# Patient Record
Sex: Female | Born: 1940 | Race: Black or African American | Hispanic: No | Marital: Married | Smoking: Former smoker
Health system: Southern US, Community
[De-identification: ages and names within clinical notes are randomized; demographics above are authoritative.]

## PROBLEM LIST (undated history)

## (undated) DIAGNOSIS — G4730 Sleep apnea, unspecified: Secondary | ICD-10-CM

## (undated) DIAGNOSIS — E785 Hyperlipidemia, unspecified: Secondary | ICD-10-CM

## (undated) DIAGNOSIS — G629 Polyneuropathy, unspecified: Secondary | ICD-10-CM

## (undated) DIAGNOSIS — N189 Chronic kidney disease, unspecified: Secondary | ICD-10-CM

## (undated) DIAGNOSIS — E119 Type 2 diabetes mellitus without complications: Secondary | ICD-10-CM

## (undated) DIAGNOSIS — N139 Obstructive and reflux uropathy, unspecified: Secondary | ICD-10-CM

## (undated) DIAGNOSIS — Z9981 Dependence on supplemental oxygen: Secondary | ICD-10-CM

## (undated) DIAGNOSIS — M545 Low back pain, unspecified: Secondary | ICD-10-CM

## (undated) DIAGNOSIS — I10 Essential (primary) hypertension: Secondary | ICD-10-CM

## (undated) DIAGNOSIS — I2510 Atherosclerotic heart disease of native coronary artery without angina pectoris: Secondary | ICD-10-CM

## (undated) DIAGNOSIS — Z923 Personal history of irradiation: Secondary | ICD-10-CM

## (undated) DIAGNOSIS — J449 Chronic obstructive pulmonary disease, unspecified: Secondary | ICD-10-CM

## (undated) DIAGNOSIS — Z992 Dependence on renal dialysis: Secondary | ICD-10-CM

## (undated) DIAGNOSIS — C801 Malignant (primary) neoplasm, unspecified: Secondary | ICD-10-CM

## (undated) DIAGNOSIS — J45909 Unspecified asthma, uncomplicated: Secondary | ICD-10-CM

## (undated) DIAGNOSIS — K219 Gastro-esophageal reflux disease without esophagitis: Secondary | ICD-10-CM

## (undated) DIAGNOSIS — D631 Anemia in chronic kidney disease: Secondary | ICD-10-CM

## (undated) DIAGNOSIS — M1990 Unspecified osteoarthritis, unspecified site: Secondary | ICD-10-CM

## (undated) DIAGNOSIS — Z9221 Personal history of antineoplastic chemotherapy: Secondary | ICD-10-CM

## (undated) DIAGNOSIS — R0600 Dyspnea, unspecified: Secondary | ICD-10-CM

## (undated) DIAGNOSIS — R319 Hematuria, unspecified: Secondary | ICD-10-CM

## (undated) DIAGNOSIS — K900 Celiac disease: Secondary | ICD-10-CM

## (undated) HISTORY — PX: ROTATOR CUFF REPAIR: SHX139

## (undated) HISTORY — PX: PORTACATH PLACEMENT: SHX2246

## (undated) HISTORY — PX: ABDOMINAL HYSTERECTOMY: SHX81

## (undated) SURGERY — Surgical Case
Anesthesia: *Unknown

---

## 2004-04-10 ENCOUNTER — Other Ambulatory Visit: Payer: Self-pay

## 2006-12-04 ENCOUNTER — Emergency Department: Payer: Self-pay | Admitting: Emergency Medicine

## 2006-12-06 ENCOUNTER — Emergency Department: Payer: Self-pay | Admitting: Emergency Medicine

## 2007-11-09 ENCOUNTER — Other Ambulatory Visit: Payer: Self-pay

## 2007-11-10 ENCOUNTER — Observation Stay: Payer: Self-pay | Admitting: Internal Medicine

## 2008-01-29 ENCOUNTER — Emergency Department: Payer: Self-pay

## 2008-04-02 ENCOUNTER — Ambulatory Visit: Payer: Self-pay

## 2008-04-19 ENCOUNTER — Ambulatory Visit: Payer: Self-pay

## 2008-05-11 ENCOUNTER — Emergency Department: Payer: Self-pay | Admitting: Emergency Medicine

## 2008-11-23 ENCOUNTER — Ambulatory Visit: Payer: Self-pay | Admitting: Internal Medicine

## 2009-10-08 ENCOUNTER — Ambulatory Visit: Payer: Self-pay | Admitting: Internal Medicine

## 2010-07-01 ENCOUNTER — Emergency Department: Payer: Self-pay | Admitting: Emergency Medicine

## 2010-10-14 ENCOUNTER — Ambulatory Visit: Payer: Self-pay | Admitting: Anesthesiology

## 2010-11-25 ENCOUNTER — Ambulatory Visit: Payer: Self-pay | Admitting: Anesthesiology

## 2011-09-18 LAB — COMPREHENSIVE METABOLIC PANEL
Anion Gap: 13 (ref 7–16)
BUN: 35 mg/dL — ABNORMAL HIGH (ref 7–18)
Bilirubin,Total: 0.2 mg/dL (ref 0.2–1.0)
Chloride: 108 mmol/L — ABNORMAL HIGH (ref 98–107)
Creatinine: 2.25 mg/dL — ABNORMAL HIGH (ref 0.60–1.30)
EGFR (African American): 28 — ABNORMAL LOW
Osmolality: 296 (ref 275–301)
Potassium: 4 mmol/L (ref 3.5–5.1)
SGPT (ALT): 23 U/L
Sodium: 143 mmol/L (ref 136–145)
Total Protein: 8.9 g/dL — ABNORMAL HIGH (ref 6.4–8.2)

## 2011-09-18 LAB — CBC
HCT: 34.3 % — ABNORMAL LOW (ref 35.0–47.0)
HGB: 11.4 g/dL — ABNORMAL LOW (ref 12.0–16.0)
MCH: 29 pg (ref 26.0–34.0)
RBC: 3.95 10*6/uL (ref 3.80–5.20)
RDW: 13.8 % (ref 11.5–14.5)
WBC: 6.2 10*3/uL (ref 3.6–11.0)

## 2011-09-18 LAB — LIPASE, BLOOD: Lipase: 288 U/L (ref 73–393)

## 2011-09-19 ENCOUNTER — Inpatient Hospital Stay: Payer: Self-pay | Admitting: Internal Medicine

## 2011-09-19 LAB — URINALYSIS, COMPLETE
Bilirubin,UR: NEGATIVE
Blood: NEGATIVE
Ketone: NEGATIVE
Nitrite: NEGATIVE
Ph: 5 (ref 4.5–8.0)
Protein: NEGATIVE
RBC,UR: 1 /HPF (ref 0–5)
WBC UR: 4 /HPF (ref 0–5)

## 2011-09-19 LAB — CK TOTAL AND CKMB (NOT AT ARMC)
CK, Total: 254 U/L — ABNORMAL HIGH (ref 21–215)
CK-MB: 1.3 ng/mL (ref 0.5–3.6)

## 2011-09-19 LAB — TROPONIN I: Troponin-I: <0.02 ng/mL

## 2011-09-20 LAB — BASIC METABOLIC PANEL
Anion Gap: 16 (ref 7–16)
BUN: 30 mg/dL — ABNORMAL HIGH (ref 7–18)
Calcium, Total: 9.3 mg/dL (ref 8.5–10.1)
Co2: 23 mmol/L (ref 21–32)
Creatinine: 2.01 mg/dL — ABNORMAL HIGH (ref 0.60–1.30)
EGFR (African American): 31 — ABNORMAL LOW
EGFR (Non-African Amer.): 26 — ABNORMAL LOW
Glucose: 130 mg/dL — ABNORMAL HIGH (ref 65–99)
Osmolality: 298 (ref 275–301)
Sodium: 146 mmol/L — ABNORMAL HIGH (ref 136–145)

## 2011-09-20 LAB — CBC WITH DIFFERENTIAL/PLATELET
Basophil #: 0 10*3/uL (ref 0.0–0.1)
Eosinophil #: 0.1 10*3/uL (ref 0.0–0.7)
HGB: 10.6 g/dL — ABNORMAL LOW (ref 12.0–16.0)
MCH: 29.1 pg (ref 26.0–34.0)
MCHC: 33.6 g/dL (ref 32.0–36.0)
MCV: 87 fL (ref 80–100)
Monocyte #: 0.5 10*3/uL (ref 0.0–0.7)
Neutrophil %: 50.9 %
Platelet: 199 10*3/uL (ref 150–440)
RBC: 3.64 10*6/uL — ABNORMAL LOW (ref 3.80–5.20)
RDW: 13.3 % (ref 11.5–14.5)

## 2011-09-20 LAB — MAGNESIUM: Magnesium: 1.9 mg/dL

## 2011-09-20 LAB — TSH: Thyroid Stimulating Horm: 1.08 u[IU]/mL

## 2011-09-20 LAB — LIPID PANEL
Cholesterol: 113 mg/dL (ref 0–200)
HDL Cholesterol: 20 mg/dL — ABNORMAL LOW (ref 40–60)
Triglycerides: 546 mg/dL — ABNORMAL HIGH (ref 0–200)

## 2011-09-21 LAB — BASIC METABOLIC PANEL
BUN: 21 mg/dL — ABNORMAL HIGH (ref 7–18)
Calcium, Total: 9.2 mg/dL (ref 8.5–10.1)
Chloride: 106 mmol/L (ref 98–107)
Co2: 23 mmol/L (ref 21–32)
EGFR (Non-African Amer.): 29 — ABNORMAL LOW
Glucose: 156 mg/dL — ABNORMAL HIGH (ref 65–99)
Osmolality: 293 (ref 275–301)
Potassium: 4 mmol/L (ref 3.5–5.1)
Sodium: 144 mmol/L (ref 136–145)

## 2011-09-21 LAB — URINE CULTURE

## 2011-10-20 ENCOUNTER — Ambulatory Visit: Payer: Self-pay | Admitting: Family Medicine

## 2012-05-09 ENCOUNTER — Ambulatory Visit: Payer: Self-pay | Admitting: Unknown Physician Specialty

## 2012-08-02 ENCOUNTER — Inpatient Hospital Stay: Payer: Self-pay | Admitting: Internal Medicine

## 2012-08-02 LAB — COMPREHENSIVE METABOLIC PANEL
BUN: 31 mg/dL — ABNORMAL HIGH (ref 7–18)
Calcium, Total: 9.2 mg/dL (ref 8.5–10.1)
Chloride: 111 mmol/L — ABNORMAL HIGH (ref 98–107)
Co2: 25 mmol/L (ref 21–32)
EGFR (African American): 28 — ABNORMAL LOW
EGFR (Non-African Amer.): 24 — ABNORMAL LOW
Glucose: 118 mg/dL — ABNORMAL HIGH (ref 65–99)
Osmolality: 291 (ref 275–301)
Potassium: 4 mmol/L (ref 3.5–5.1)
SGOT(AST): 21 U/L (ref 15–37)
SGPT (ALT): 19 U/L (ref 12–78)
Sodium: 142 mmol/L (ref 136–145)
Total Protein: 8.9 g/dL — ABNORMAL HIGH (ref 6.4–8.2)

## 2012-08-02 LAB — URINALYSIS, COMPLETE
Glucose,UR: NEGATIVE mg/dL (ref 0–75)
Ketone: NEGATIVE
Nitrite: NEGATIVE
Ph: 5 (ref 4.5–8.0)
Protein: NEGATIVE
RBC,UR: 4 /HPF (ref 0–5)
Specific Gravity: 1.012 (ref 1.003–1.030)
WBC UR: <1 /HPF (ref 0–5)

## 2012-08-02 LAB — CBC
HCT: 30.4 % — ABNORMAL LOW (ref 35.0–47.0)
HGB: 9.9 g/dL — ABNORMAL LOW (ref 12.0–16.0)
MCH: 28.3 pg (ref 26.0–34.0)
MCHC: 32.7 g/dL (ref 32.0–36.0)
Platelet: 216 10*3/uL (ref 150–440)
RBC: 3.51 10*6/uL — ABNORMAL LOW (ref 3.80–5.20)
WBC: 7.7 10*3/uL (ref 3.6–11.0)

## 2012-08-02 LAB — LIPASE, BLOOD: Lipase: 500 U/L — ABNORMAL HIGH (ref 73–393)

## 2012-08-02 LAB — TROPONIN I: Troponin-I: <0.02 ng/mL

## 2012-08-02 LAB — CK TOTAL AND CKMB (NOT AT ARMC): CK, Total: 291 U/L — ABNORMAL HIGH (ref 21–215)

## 2012-08-03 LAB — BASIC METABOLIC PANEL
Calcium, Total: 9 mg/dL (ref 8.5–10.1)
Co2: 23 mmol/L (ref 21–32)
Creatinine: 1.95 mg/dL — ABNORMAL HIGH (ref 0.60–1.30)
EGFR (African American): 29 — ABNORMAL LOW
Glucose: 113 mg/dL — ABNORMAL HIGH (ref 65–99)
Osmolality: 283 (ref 275–301)
Potassium: 3.8 mmol/L (ref 3.5–5.1)
Sodium: 139 mmol/L (ref 136–145)

## 2012-08-03 LAB — CBC WITH DIFFERENTIAL/PLATELET
Basophil %: 0.9 %
Eosinophil #: 0.1 10*3/uL (ref 0.0–0.7)
HCT: 30 % — ABNORMAL LOW (ref 35.0–47.0)
Lymphocyte %: 28.4 %
MCH: 29.2 pg (ref 26.0–34.0)
Monocyte #: 0.7 x10 3/mm (ref 0.2–0.9)
Monocyte %: 7.2 %
Neutrophil #: 5.8 10*3/uL (ref 1.4–6.5)
Platelet: 210 10*3/uL (ref 150–440)
RBC: 3.47 10*6/uL — ABNORMAL LOW (ref 3.80–5.20)

## 2012-08-03 LAB — LIPID PANEL
Cholesterol: 100 mg/dL (ref 0–200)
HDL Cholesterol: 31 mg/dL — ABNORMAL LOW (ref 40–60)
Ldl Cholesterol, Calc: 22 mg/dL (ref 0–100)
Triglycerides: 235 mg/dL — ABNORMAL HIGH (ref 0–200)
VLDL Cholesterol, Calc: 47 mg/dL — ABNORMAL HIGH (ref 5–40)

## 2012-08-03 LAB — TROPONIN I: Troponin-I: <0.02 ng/mL

## 2012-08-03 LAB — CK TOTAL AND CKMB (NOT AT ARMC): CK-MB: <0.5 ng/mL — ABNORMAL LOW (ref 0.5–3.6)

## 2012-08-04 LAB — CBC WITH DIFFERENTIAL/PLATELET
Basophil %: 0.5 %
Eosinophil #: 0.2 10*3/uL (ref 0.0–0.7)
Eosinophil %: 1.8 %
HCT: 29.6 % — ABNORMAL LOW (ref 35.0–47.0)
HGB: 10.1 g/dL — ABNORMAL LOW (ref 12.0–16.0)
Lymphocyte #: 2.7 10*3/uL (ref 1.0–3.6)
Lymphocyte %: 29.9 %
MCH: 29.4 pg (ref 26.0–34.0)
MCHC: 34.2 g/dL (ref 32.0–36.0)
MCV: 86 fL (ref 80–100)
Neutrophil #: 5.4 10*3/uL (ref 1.4–6.5)
Platelet: 225 10*3/uL (ref 150–440)
WBC: 9.1 10*3/uL (ref 3.6–11.0)

## 2012-08-04 LAB — BASIC METABOLIC PANEL
BUN: 25 mg/dL — ABNORMAL HIGH (ref 7–18)
Calcium, Total: 9 mg/dL (ref 8.5–10.1)
Co2: 26 mmol/L (ref 21–32)
Creatinine: 1.97 mg/dL — ABNORMAL HIGH (ref 0.60–1.30)
EGFR (African American): 29 — ABNORMAL LOW
Glucose: 133 mg/dL — ABNORMAL HIGH (ref 65–99)
Potassium: 3.7 mmol/L (ref 3.5–5.1)

## 2012-08-04 LAB — URIC ACID: Uric Acid: 8.7 mg/dL — ABNORMAL HIGH (ref 2.6–6.0)

## 2012-08-05 LAB — CBC WITH DIFFERENTIAL/PLATELET
Basophil #: 0.1 x10 3/mm 3
Basophil %: 0.6 %
Eosinophil #: 0 x10 3/mm 3
Eosinophil %: 0 %
HCT: 30.2 % — ABNORMAL LOW
HGB: 9.9 g/dL — ABNORMAL LOW
Lymphocyte %: 14.4 %
Lymphs Abs: 1.2 x10 3/mm 3
MCH: 28 pg
MCHC: 32.9 g/dL
MCV: 85 fL
Monocyte #: 0.3 "x10 3/mm "
Monocyte %: 3.1 %
Neutrophil #: 7 x10 3/mm 3 — ABNORMAL HIGH
Neutrophil %: 81.9 %
Platelet: 230 x10 3/mm 3
RBC: 3.54 X10 6/mm 3 — ABNORMAL LOW
RDW: 13.6 %
WBC: 8.6 x10 3/mm 3

## 2012-08-05 LAB — BASIC METABOLIC PANEL
Chloride: 102 mmol/L (ref 98–107)
Creatinine: 2.03 mg/dL — ABNORMAL HIGH (ref 0.60–1.30)
EGFR (African American): 28 — ABNORMAL LOW
EGFR (Non-African Amer.): 24 — ABNORMAL LOW
Glucose: 162 mg/dL — ABNORMAL HIGH (ref 65–99)
Potassium: 4.3 mmol/L (ref 3.5–5.1)
Sodium: 137 mmol/L (ref 136–145)

## 2012-08-06 LAB — CBC WITH DIFFERENTIAL/PLATELET
HCT: 28.9 % — ABNORMAL LOW (ref 35.0–47.0)
HGB: 9.8 g/dL — ABNORMAL LOW (ref 12.0–16.0)
MCH: 29 pg (ref 26.0–34.0)
MCHC: 33.9 g/dL (ref 32.0–36.0)
MCV: 86 fL (ref 80–100)
Monocyte #: 0.4 x10 3/mm (ref 0.2–0.9)
Platelet: 249 10*3/uL (ref 150–440)
RBC: 3.37 10*6/uL — ABNORMAL LOW (ref 3.80–5.20)
WBC: 10.2 10*3/uL (ref 3.6–11.0)

## 2012-08-06 LAB — BASIC METABOLIC PANEL
Anion Gap: 11 (ref 7–16)
BUN: 54 mg/dL — ABNORMAL HIGH (ref 7–18)
Calcium, Total: 8.8 mg/dL (ref 8.5–10.1)
Chloride: 100 mmol/L (ref 98–107)
Co2: 23 mmol/L (ref 21–32)
Creatinine: 2.59 mg/dL — ABNORMAL HIGH (ref 0.60–1.30)
EGFR (African American): 21 — ABNORMAL LOW
Glucose: 186 mg/dL — ABNORMAL HIGH (ref 65–99)
Osmolality: 288 (ref 275–301)

## 2012-08-07 LAB — BASIC METABOLIC PANEL
Anion Gap: 9 (ref 7–16)
BUN: 63 mg/dL — ABNORMAL HIGH (ref 7–18)
Calcium, Total: 9.1 mg/dL (ref 8.5–10.1)
Chloride: 104 mmol/L (ref 98–107)
Co2: 25 mmol/L (ref 21–32)
Creatinine: 2.48 mg/dL — ABNORMAL HIGH (ref 0.60–1.30)
EGFR (African American): 22 — ABNORMAL LOW
EGFR (Non-African Amer.): 19 — ABNORMAL LOW
Glucose: 172 mg/dL — ABNORMAL HIGH (ref 65–99)
Osmolality: 298 (ref 275–301)
Potassium: 4.4 mmol/L (ref 3.5–5.1)
Sodium: 138 mmol/L (ref 136–145)

## 2012-08-07 LAB — CBC WITH DIFFERENTIAL/PLATELET
Eosinophil %: 0 %
HCT: 30.7 % — ABNORMAL LOW (ref 35.0–47.0)
HGB: 10.2 g/dL — ABNORMAL LOW (ref 12.0–16.0)
Lymphocyte #: 1.6 10*3/uL (ref 1.0–3.6)
MCH: 28.4 pg (ref 26.0–34.0)
MCHC: 33.3 g/dL (ref 32.0–36.0)
Monocyte %: 3.9 %
Neutrophil #: 8.1 10*3/uL — ABNORMAL HIGH (ref 1.4–6.5)
Neutrophil %: 80.3 %
Platelet: 294 10*3/uL (ref 150–440)

## 2012-08-25 ENCOUNTER — Ambulatory Visit: Payer: Self-pay | Admitting: Internal Medicine

## 2012-12-20 ENCOUNTER — Ambulatory Visit: Payer: Self-pay | Admitting: Internal Medicine

## 2013-06-06 ENCOUNTER — Ambulatory Visit: Payer: Self-pay | Admitting: Internal Medicine

## 2013-06-19 ENCOUNTER — Ambulatory Visit: Payer: Self-pay | Admitting: Internal Medicine

## 2013-07-06 ENCOUNTER — Ambulatory Visit: Payer: Self-pay | Admitting: Podiatrist

## 2013-07-20 ENCOUNTER — Ambulatory Visit: Payer: Self-pay | Admitting: Internal Medicine

## 2013-11-22 ENCOUNTER — Emergency Department: Payer: Self-pay | Admitting: Emergency Medicine

## 2013-11-22 LAB — SYNOVIAL CELL COUNT + DIFF, W/ CRYSTALS
Basophil: 0 %
Eosinophil: 0 %
LYMPHS PCT: 10 %
NEUTROS PCT: 81 %
NUCLEATED CELL COUNT: 274 /mm3
Other Cells BF: 0 %
Other Mononuclear Cells: 9 %

## 2013-11-26 LAB — BODY FLUID CULTURE

## 2013-12-08 DIAGNOSIS — I10 Essential (primary) hypertension: Secondary | ICD-10-CM | POA: Insufficient documentation

## 2013-12-08 DIAGNOSIS — I2510 Atherosclerotic heart disease of native coronary artery without angina pectoris: Secondary | ICD-10-CM | POA: Insufficient documentation

## 2013-12-08 DIAGNOSIS — E119 Type 2 diabetes mellitus without complications: Secondary | ICD-10-CM | POA: Insufficient documentation

## 2014-01-03 ENCOUNTER — Ambulatory Visit: Payer: Self-pay | Admitting: Internal Medicine

## 2014-09-07 ENCOUNTER — Ambulatory Visit: Payer: Self-pay | Admitting: Nurse Practitioner

## 2014-10-03 ENCOUNTER — Ambulatory Visit: Payer: Self-pay | Admitting: Nephrology

## 2014-10-05 DIAGNOSIS — M19019 Primary osteoarthritis, unspecified shoulder: Secondary | ICD-10-CM | POA: Insufficient documentation

## 2014-10-05 DIAGNOSIS — M75122 Complete rotator cuff tear or rupture of left shoulder, not specified as traumatic: Secondary | ICD-10-CM | POA: Insufficient documentation

## 2014-10-30 ENCOUNTER — Ambulatory Visit
Admit: 2014-10-30 | Disposition: A | Discharge status: Home / Self Care | Payer: Self-pay | Attending: Surgery | Admitting: Surgery

## 2014-10-30 LAB — POTASSIUM: Potassium: 3.2 mmol/L — ABNORMAL LOW

## 2014-11-02 DIAGNOSIS — G4733 Obstructive sleep apnea (adult) (pediatric): Secondary | ICD-10-CM | POA: Insufficient documentation

## 2014-11-02 DIAGNOSIS — I700 Atherosclerosis of aorta: Secondary | ICD-10-CM | POA: Insufficient documentation

## 2014-11-02 DIAGNOSIS — N184 Chronic kidney disease, stage 4 (severe): Secondary | ICD-10-CM | POA: Insufficient documentation

## 2014-11-06 ENCOUNTER — Ambulatory Visit
Admit: 2014-11-06 | Disposition: A | Discharge status: Home / Self Care | Payer: Self-pay | Attending: Surgery | Admitting: Surgery

## 2014-11-09 NOTE — Discharge Summary (Signed)
DATE OF BIRTH:  1940-11-13  DISCHARGE DIAGNOSES:  1.  Acute respiratory failure secondary to Pneumonia and chronic obstructive pulmonary disease.  2  Pancreatitis- Resolved 3.  Sleep apnea.  4.  Obesity.  5.  Chronic kidney disease.  6.  Hypertension.   CHIEF COMPLAINT:  Left upper quadrant abdominal pain.   HISTORY OF PRESENT ILLNESS:  The patient is a 74 year old female, who had been complaining of left upper quadrant pain for about 2 days. The patient was noted to be hypoxemic in the ER with 88% sats on room air. She underwent a CT scan of the abdomen and pelvis. Chest x-ray showed evidence of left lower lobe pneumonia, and the patient was also noted to have an elevated lipase of 500. The patient received Zofran and IV Dilaudid that appeared to improve her symptoms to some extent.   PAST MEDICAL HISTORY:  Significant for COPD, hypertension, type 2 diabetes, sleep apnea, history of chronic kidney disease stage III, and hyperlipidemia.   PHYSICAL EXAMINATION:  VITAL SIGNS:  Temp was 98.6, heart rate was 76, blood pressure 136/64, respirations 20, O2 sat 94% on room air, but subsequently dropped to 88%.  HEENT:  NCAT. Eyes, pupils equal and reactive to light.  NECK:  Normal range of motion.  LUNGS:  No crackles. Scattered expiratory wheezes, plus.  HEART:  S1, S2.  ABDOMEN:  Soft. Left upper quadrant tenderness noted. Bowel sounds were normal.  EXTREMITIES:  No edema.  NEUROLOGICAL:  Nonfocal.   LABORATORIES:  Hemoglobin 9.9, hematocrit 30.4, platelets 216. Sodium 142, potassium 4, chloride 111, bicarb 25, BUN 31, creatinine 2, glucose 118, lipase 500, baseline serum creatinine 1.82 in March 2013. CT of the abdomen showed a right hepatic lobe lesion that appeared to be a cyst vs  hemangioma. Spleen was normal. Chest x-ray showed evidence of left lower lobe pneumonia. EKG showed sinus rhythm with a heart rate of 73 beats per minute.   HOSPITAL COURSE:  The patient was admitted to [facility] and  started on intravenous antibiotics with azithromycin. She was also placed on sliding scale insulin. She received nebulized bronchodilator therapy. She was continued on amlodipine, and she was also started on intravenous steroids that were gradually tapered. The patient did improve symptomatically with chest x-ray showing minimal left basilar opacities that was most likely secondary atelectasis. She was also started on Levaquin and symptomatically felt better. She was continued on CPAP, and her losartan/HCT was held because of renal impairment. She was discharged home on the following medications.   DISCHARGE MEDICATIONS:  Simvastatin 40 mg once a day, Nexium 40 mg a day, furosemide 40 mg once a day, amlodipine 10 mg once a day, aspirin 81 mg a day, Tradjenta 5 mg once a day, allopurinol 100 mg a day, metoprolol tartrate 25 mg b.i.d., ferrous sulfate 325 mg 1 tablet a day, Combivent Respimat 1 puff q.i.d. p.r.n., prednisone taper starting at 40 mg a day for 3 days and decrease by 10 mg every 3 days until gone, Levaquin 250 mg p.o. daily for 10 days.   DISCHARGE INSTRUCTIONS:  She has been advised to follow up with Dr. Gilford Rile in 1 to 2 weeks' time. Advised to continue her CPAP with 2 L oxygen at home as prescribed. Advised low sodium diet and to have a repeat CBC and Met-B in 1 week and to have a repeat chest x-ray as well to make sure that her Pneumonia has cleared up. The patient has been advised to call the office and  report to ER if she has any worsening shortness of breath at any time.   Total time for discharge and co ordination of care: 35 minutes   ____________________________ Tracie Harrier, MD vh:ms D: 08/07/2012 14:01:01 ET T: 08/07/2012 19:30:41 ET JOB#: [id number]  cc: Tracie Harrier, MD, <Dictator> Tracie Harrier MD ELECTRONICALLY SIGNED 08/30/2012 8:40

## 2014-11-09 NOTE — H&P (Signed)
PATIENT NAME:  Barbara [name], Barbara [name] DATE OF BIRTH:  August 28, 1940  DATE OF ADMISSION:  08/02/2012  PRIMARY CARE PHYSICIAN:  Lisette Grinder, MD  ER PHYSICIAN:  Loletta Specter, MD   CHIEF COMPLAINT: Left upper quadrant pain.   HISTORY OF PRESENT ILLNESS: The patient is a 74 year old female patient who came in for left-sided upper quadrant pain for 2 days. The patient says that the pain radiates to the back. No nausea, no vomiting, no diarrhea, and patient denies radiation of the pain to the epigastric area. She denies any cough or fever. No trouble breathing. In the ER, she is found to be hypoxic with sats of 88% on room air. She had a CT of the abdomen and pelvis, and also x-ray of the chest. Chest x-ray showed pneumonia in the left lower lobe. Also, she had elevated lipase up to 500. The patient is going to be admitted for acute pancreatitis and also pneumonia. The patient received a dose of Zofran and also IV Dilaudid 0.5 mg, but she is still having pain, around 8 out of 10 in the left upper quadrant pain and she is in mild distress because of the pain.   PAST MEDICAL HISTORY:  Significant for COPD, hypertension, diabetes, sleep apnea, history of chronic kidney disease stage III and hyperlipidemia. The patient had admission March 2013 significant for positive COPD status at that time.  She has sleep apnea and uses CPAP.  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   ALLERGIES:  PREVACID.    SOCIAL HISTORY: Previous smoker, quit. No alcohol. No drugs. The patient lives with her husband. History of heavy drinking before.   FAMILY HISTORY: Father had lung cancer. Mother had diabetes, hypertension.   PAST SURGICAL HISTORY: Significant for history of hysterectomy.   MEDICATIONS: Amlodipine 10 mg p.o. daily, aspirin 81 mg daily, ferrous sulfate 325 mg p.o. daily, furosemide 40 mg p.o. daily, hydrochlorothiazide with losartan 25/100 mg p.o. daily, Nexium 40 mg p.o.  daily, simvastatin 40 mg p.o. daily, Tradjenta 5 mg p.o. daily.   REVIEW OF SYSTEMS: CONSTITUTIONAL: Has no fever. No fatigue.  EYES: No blurred vision.  EARS, NOSE, THROAT: No tinnitus. No ear pain. No epistaxis.  RESPIRATORY: Denies any cough or wheezing. Has a history of sleep apnea.  CARDIOVASCULAR: No chest pain. No orthopnea. No PND. No palpitations.  GASTROINTESTINAL: No nausea. No vomiting. Has left upper quadrant abdominal pain. No jaundice. No constipation.  GENITOURINARY: No dysuria.  ENDOCRINE: No polyuria or nocturia.  INTEGUMENT: No skin rashes.  MUSCULOSKELETAL: No joint pains.  NEUROLOGIC: No weakness. No numbness.  PSYCHIATRIC: No anxiety or insomnia.   PHYSICAL EXAMINATION: VITAL SIGNS: Temperature 98.6, heart rate 76. Blood pressure is 136/64, respirations 20, sats 94% on room air initially, but dropped to 88% on room air at one point in the ER visit.  HEAD:  Atraumatic, normocephalic.  EYES:   Pupils equally reacting to light. Extraocular movements intact.  EARS, NOSE, THROAT: No tympanic membrane congestion. No turbinate hypertrophy. No oropharyngeal erythema.  NECK: Normal range of motion. No JVD. No carotid bruit.  RESPIRATORY: Clear to auscultation. No wheeze. No rales. No respiratory distress.  CARDIOVASCULAR: S1, S2 regular. PMI not displaced.  CHEST:  Nontender.  ABDOMEN: Left upper quadrant tenderness present. The patient has bowel sounds present. No organomegaly is appreciated. Very tender to palpation in the left upper quadrant. No rebound tenderness.  MUSCULOSKELETAL: Strength 5/5 in upper and lower extremities. Good pedal pulses. No extremity  edema.  SKIN: No skin rashes.  NEUROLOGIC: Cranial nerves II through XII are intact. There are no sensory deficits. No motor deficit.  PSYCHIACTRIC: Oriented to time, place, person.   LABORATORY, DIAGNOSTIC AND RADIOLOGICAL DATA: Urine is clear. WBC 7.7, hemoglobin 9.9, hematocrit 30.4, platelets 216. Electrolytes:  Sodium is 142, potassium 4, chloride 111, bicarbonate 25, BUN 31, creatinine 2, glucose 118, lipase 500. The patient's recent blood work showed BMP, BUN 21, creatinine 1.82 from March 2013.   CT scan of the abdomen shows patient had a right hepatic lobe lesion, appears to be cyst or hemangioma. Spleen is normal. Pancreas normal. Adrenals normal. Left renal cyst, renal vascular opacification. The patient does have some phleboliths and nonobstructing ureteral calculus. The patient also found to have bibasilar pneumonia.   Chest x-ray shows left lower lobe pneumonia. No pleural effusion. No osseous abnormalities.   The patient's EKG shows normal sinus rhythm at 73 beats per minute. No ST-T changes.   ASSESSMENT AND PLAN: 1.  The patient is a 74 year old female patient with hypoxia with respiratory failure, with O2 sat dropping to 88% on 2 liters, improving to about 90% on 4 liters. She is going to be admitted for hypoxic respiratory failure with pneumonia. The patient has already received Rocephin in the ER. Admit to hospitalist service on telemetry. Continue Rocephin, Zithromax and keep oxygen to maintain sats of about 90% to 95%. The patient's blood cultures will be followed.  2.  Sleep apnea. Uses CPAP at night.  3.  Chronic kidney disease, stage III, with acute on chronic renal failure. Will hold hydrochlorothiazide  and losartan at this time, monitor BMP and also hold her Lasix, as well.  4.  Left upper quadrant pain with clinical evidence of pancreatitis, even though CT does not show inflammation. So we will give her clear liquids, continue IV Dilaudid and IV Zofran and follow the lipase.  5.  The patient has acute pancreatitis with elevated lipase. LFTs are normal, so probably due to her triglycerides, because her triglycerides were 546 in March last year.  We are going to repeat another lipid panel tomorrow. 6.   Patient will be seen by Dr. Lisette Grinder tomorrow.    TIME SPENT: About 55 minutes.     ____________________________ Epifanio Lesches, MD sk:cc D: 08/02/2012 15:38:00 ET T: 08/02/2012 17:08:30 ET JOB#: [id number]  cc: Epifanio Lesches, MD, <Dictator> Epifanio Lesches MD ELECTRONICALLY SIGNED 09/09/2012 9:11

## 2014-11-11 NOTE — H&P (Signed)
PATIENT NAME:  Barbara [name], LUBINSKI MR#:  [id number] DATE OF BIRTH:  1940-10-02  DATE OF ADMISSION:  09/19/2011  REFERRING PHYSICIAN: Dr. Prince Rome.   PRIMARY CARE PHYSICIAN: Audley Hose, MD  PRIMARY NEPHROLOGIST: Anthonette Legato, MD  PRESENTING COMPLAINT: Left side pain.   HISTORY OF PRESENT ILLNESS: [name] is a pleasant 74 year old woman with history of diabetes, sleep apnea, on CPAP, hypertension, hyperlipidemia, chronic obstructive pulmonary disease, distant alcohol abuse, chronic kidney disease stage III who presents with reports of left side pain. The patient reports that 6 days ago she began developing her left side pain. Denies any associated nausea or vomiting. She endorses constipation. No dysuria or hematuria. No chest pain. Reports that her shortness of breath is actually at baseline. She had ongoing symptoms and was not able to get an appointment with her primary physician and presented to [facility] on 09/17/2011. She had abdomen, 2 view, with chest performed with urinalysis, CBC, and CMP. She was discharged for treatment of nephrolithiasis and constipation. She was given a prescription for Percocet and recommendation for Colace but received a call on 09/18/2011 from [facility]gent Care recommending that she present to the emergency room for evaluation of her kidney function. Her creatinine noted to be 2.31 with a BUN of 36. Patient denies changes in her urine output. She endorses compliance with her CPAP and denies using O2 at home. On arrival here she was found to be hypoxic, room air saturations of 89% and her ABG with pO2 of 52 and O2 saturation of 83% on nasal cannula.   PAST MEDICAL HISTORY:  1. Obstructive sleep apnea, on CPAP.  2. Hypertension.  3. Diabetes.   4. Hyperlipidemia.  5. Chronic obstructive pulmonary disease.  6. Obesity.  7. Glaucoma.  8. 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  9. Gastrointestinal  bleeding/gastritis.  10. Chronic kidney disease stage III.   PAST SURGICAL HISTORY: Hysterectomy.   ALLERGIES: No known drug allergies.   MEDICATIONS:  1. Acetaminophen/hydrocodone 10/325 every six hours as needed.  2. Actos questionable 30 mg daily.  3. Amlodipine questionable 10 mg daily.  4. Aspirin 81 mg daily.  5. Gabapentin 900 mg at bedtime.  6. Glipizide XL 2.5 mg daily.  7. Hydrochlorothiazide 25 mg daily.  8. Hydrocodone compounding powder.  9. Iron pill daily.  10. Nexium 40 mg daily.  11. Simvastatin 40 mg daily.  12. Travatan eye drops.  13. Vitamin D3 at 1000 international units daily.   SOCIAL HISTORY: She lives in [place] with her husband. She used to smoke 1-1/2 packs per day for about 45 years. Stopped smoking in 2004. She used to drink heavily, quit at least 15 years ago.   FAMILY HISTORY: Father had lung cancer. Mother had diabetes, hypertension, CVA, bilateral lower extremity amputation.   REVIEW OF SYSTEMS:  CONSTITUTIONAL: She denies any nausea or vomiting. EYES: History of glaucoma. ENT: No epistaxis or discharge. RESPIRATORY: She has had mild cough that is nonproductive. No hemoptysis. Reports that her shortness of breath is at baseline.  CARDIOVASCULAR: No chest pain, palpitations, or syncope. No worsening lower extremity edema. GASTROINTESTINAL: No nausea or vomiting. She reports constipation and left side abdominal and flank pain. No hematemesis or melena. GU: No dysuria or hematuria. ENDOCRINE:  No polyuria or polydipsia. HEMATOLOGIC: No bleeding. SKIN: No ulcers.  MUSCULOSKELETAL: Denies any joint swelling. NEUROLOGIC: No history of strokes or seizures. PSYCHIATRIC: Denies any suicidal ideation. She endorses depression.   PHYSICAL EXAMINATION:  VITAL  SIGNS: Temperature 98.1, pulse 84, respiratory rate 18, blood pressure 118/56, saturating 89% on room air, now 92% to 96% on 2 liters.   GENERAL: Lying in bed in no apparent distress.   HEENT:  Normocephalic, atraumatic. Pupils equal, symmetric. Nasal cannula in place. Moist mucous membranes.   NECK: Soft and supple. No adenopathy or JVP.   CARDIOVASCULAR: Non-tachycardic. No murmurs, rubs, or gallops.   LUNGS: Basilar crackles, distant breath sounds. No use of accessory muscles or increased respiratory effort.   ABDOMEN: Soft. Positive bowel sounds. She has tenderness on the left side from lower abdomen up to her left flank. No rebound or guarding. No mass appreciated.   EXTREMITIES: Trace edema bilaterally. Dorsal pedis pulses intact.   MUSCULOSKELETAL: No joint effusion.   SKIN: No ulcers.   NEUROLOGIC: No dysarthria or aphasia. Symmetrical strength. No focal deficits.   PSYCHIATRIC: She is alert and oriented. The patient is cooperative.   PERTINENT LABS AND STUDIES: ABG on nasal cannula with pH of 7.33, pO2 of 052, pCO2 of 45, bicarbonate of 23.7, oxygen saturation of 83. Urinalysis with specific gravity of 1.014, pH of 5, leukocyte esterase trace, RBC 1 per high-power field, WBC 4 per high-power field. Troponin less than 0.02. WBC 6.2, hemoglobin 11.4, hematocrit 34.3, platelet 210,000.  MCV of 87, glucose of 148, BUN 35, creatinine 2.25, sodium 143, potassium 4.0, chloride 108, carbon dioxide 22, total protein 8.9, albumin of 4. Lipase of 288. EKG with sinus rate of 76. No ST elevation or depression Creatinine from Urgent Care on 09/17/2011 was 2.31. Her urinalysis was negative for blood, protein, negative for nitrites. Negative for leukocyte esterase. Abdomen, 2 view, with chest shows hazy linear opacities in the left lung base, may represent atelectasis or developing pneumonitis. Clinical correlation is recommended. There is a 2 mm calcific density overlying the left kidney. Question small renal stone versus contents within the fecal stream. Clinical correlation is recommended. There is moderate amount of retained stool.   ASSESSMENT AND PLAN: [name] is a pleasant  74 year old woman with history of chronic obstructive pulmonary disease, hypertension, diabetes, hyperlipidemia, obstructive sleep apnea, on CPAP, chronic kidney disease stage III, presenting with left side and flank pain.  1. Hypoxia, likely multifactorial with underlying chronic obstructive pulmonary disease and obstructive sleep apnea plus/minus left lung base pneumonia. She does not appear to be decompensated from baseline so I wonder if there is some chronicity involved in this. We will repeat her chest x-ray posthydration. Continue ceftriaxone and azithromycin for now. Continue SVNs, O2. Obtain ambulatory pulse oximetry. We will also obtain a V/Q scan as she is a set up for pulmonary embolism.    2. Abdominal/flank pain likely in the setting of obstipation and nephrolithiasis. Abdominal film from Urgent Care with probable left renal stone and moderate amount of stool. Symptom control, IV fluids, start a bowel regimen. Her urinalysis is still unrevealing. We will send urine culture.   3. Acute on chronic kidney disease, likely prerenal. Her baseline creatinine is around 1.8. Hold hydrochlorothiazide, gentle fluid repletion. Follow daily creatinine, inputs and outputs.  4. Obstructive sleep apnea, on CPAP.  5. Diabetes. Glipizide sliding scale insulin.  6. Hypertension. Restart Norvasc.  7. Prophylaxis with Nexium, aspirin, and heparin subcutaneous.   TIME SPENT: Approximately 55 minutes spent on patient care.     ____________________________ Rita Ohara, MD ap:vtd D: 09/19/2011 03:50:38 ET T: 09/19/2011 06:31:05 ET JOB#: [id number]  cc: Brien Few Alegra Rost, MD, <Dictator> Doylene Bode, MD Rita Ohara MD ELECTRONICALLY  SIGNED 09/20/2011 5:07

## 2014-11-11 NOTE — Discharge Summary (Signed)
PATIENT NAME:  Barbara [name], Barbara [name] MR#:  [id number] DATE OF BIRTH:  1941/02/20  DATE OF ADMISSION:  09/19/2011 DATE OF DISCHARGE:  09/21/2011  PRIMARY CARE PHYSICIAN: Dr. Audley Hose  FINAL DIAGNOSES:  1. Acute respiratory failure with hypoxia requiring home oxygen.  2. Flank pain, most likely musculoskeletal.  3. Constipation.  4. Chronic obstructive pulmonary disease.  5. Hypertension.  6. Diabetes.  7. Sleep apnea.  8. Acute on chronic kidney disease.  9. Hyperlipidemia.   MEDICATIONS ON DISCHARGE: 1. Vitamin D3 1000 international units daily. 2. Glipizide XL 2.5 mg daily. 3. Iron pill 1 daily. 4. Travatan eyedrops 1 drop each eye at bedtime.  5. Amlodipine 10 mg p.o. daily.  6. Hydrochlorothiazide 25 mg p.o. daily.  7. Simvastatin 40 mg p.o. nightly.  8. Actos 15 mg p.o. daily.  9. Aspirin 81 mg p.o. daily.  10. Zithromax 250 mg p.o. daily until completion.  11. Prednisone taper as written on prescription to completion.  12. Percocet 5/325, 1 tablet every four hours as needed for pain. 13. Colace 100 mg p.o. twice a day for constipation.  14. Nexium 40 mg p.o. daily.   ACTIVITY: As tolerated.   DIET: Low sodium diet, 1800 ADA diet.   OXYGEN: 2 liters nasal cannula.   FOLLOW UP: Follow up in 1 to 2 weeks with Dr. Audley Hose.   LABORATORY, DIAGNOSTIC AND RADIOLOGICAL DATA: Laboratory and radiological data during the hospital course included chest x-ray showed no acute abnormality. Lipase 228, glucose 148, potassium 4.0, BUN 35, creatinine 2.25, sodium 143, chloride 108, CO2 22, calcium 9.0. Liver function tests: Total protein slightly high at 8.9. White blood cell count 6.2, hemoglobin and hematocrit 11.4 and 34.3, platelet count 210. Troponin negative. CT scan of the abdomen and pelvis showed right hepatic lobe approximately 2 cm indeterminate lesion. No obstructing ureteral stone. Can follow up with MRI or ultrasound which can be done as outpatient. Urinalysis shows trace  leukocyte esterase. EKG: Normal sinus rhythm, no acute ST-T wave changes. ABG showed pH 7.33, pCO2 45, pO2 52, bicarbonate 23.7, oxygen saturation 83. Troponin negative. V/Q scan low probability for pulmonary embolism. Next two sets of troponins were negative. Urine culture had 1000 colonies of gram-positive cocci which is not significant. TSH 1.08, total cholesterol 113, triglycerides 546, HDL 20, hemoglobin A1c 7.8, magnesium 1.9. Occult blood feces negative. Oxygen saturation room air 89%. Creatinine upon discharge 1.82. Hemoglobin 10.9. Another pulse oximetry on room air 86% after ambulation.   HOSPITAL COURSE PER PROBLEM LIST:  1. For the patient's acute respiratory failure and hypoxia. She does require home oxygen. Oxygen was prescribed. The etiology of this is probably obesity and chronic obstructive pulmonary disease. V/Q scan was negative for pulmonary embolism. Chest x-ray was negative. I did give antibiotic course with initially Rocephin and Z-Pak just in case this was infection related. Patient felt better with the oxygen on and really did not have any symptoms of shortness of breath.  2. For the patient's flank pain on the left side coming around most likely this is musculoskeletal, even very painful to light touch. The patient was on gabapentin in the past but was taken off. I did not restart this at this time. Patient was given a prednisone taper just in case this was musculoskeletal see if this improves. Unable to give anti-inflammatory secondary to chronic kidney disease. She is also given a script for Percocet p.r.n. pain.  3. For her constipation she was given Colace. She did require stronger  medications in the hospital to go.  4. For her chronic obstructive pulmonary disease her respiratory status is stable. She was given oxygen supplementation. Can consider inhalers as outpatient but again she was not really complaining of any respiratory symptoms.  5. Hypertension. Initially her  hydrochlorothiazide was held. She is on amlodipine. Her hydrochlorothiazide can be restarted as outpatient but may not work as well with her chronic kidney disease.  6. For her diabetes, she is on low-dose glipizide and Actos. Sugars will be high while on steroids.  7. For sleep apnea she wears CPAP at night.  8. For acute on chronic kidney disease can consider ACE inhibitor as outpatient.  9. For her hyperlipidemia she is on simvastatin but can consider medications for triglycerides since triglycerides are over 500.   TIME SPENT ON DISCHARGE: 35 minutes.   ____________________________ Tana Conch. Leslye Peer, MD rjw:cms D: 09/22/2011 17:06:29 ET T: 09/23/2011 11:46:55 ET JOB#: [id number]  cc: Tana Conch. Leslye Peer, MD, <Dictator> Doylene Bode, MD Marisue Brooklyn MD ELECTRONICALLY SIGNED 09/23/2011 13:15

## 2014-11-12 DIAGNOSIS — S46909A Unspecified injury of unspecified muscle, fascia and tendon at shoulder and upper arm level, unspecified arm, initial encounter: Secondary | ICD-10-CM | POA: Insufficient documentation

## 2014-11-18 NOTE — Op Note (Signed)
PATIENT NAME:  Barbara [name], Barbara [name] MR#:  [id number] DATE OF BIRTH:  24-May-1941  DATE OF PROCEDURE:  11/06/2014  PREOPERATIVE DIAGNOSIS: Impingement/tendinopathy with rotator cuff tear, left shoulder.   POSTOPERATIVE DIAGNOSIS: Impingement/tendinopathy with rotator cuff tear and degenerative joint disease, left shoulder.   PROCEDURES: Extensive arthroscopic debridement, arthroscopic subacromial decompression, mini-open rotator cuff repair, and mini-open soft tissue biceps tendon tenodesis.   SURGEON: Pascal Lux, MD   ANESTHESIA: General endotracheal with an interscalene block placed preoperatively by the anesthesiologist.   FINDINGS: As noted above. There was extensive grade 3-4 chondromalacial changes involving the humeral head and glenoid. There was fraying of the anterior and superior portions of the labrum. There was extensive biceps tendinopathic changes, as well. There was a small full-thickness tear involving the mid insertional fibers of the supraspinatus. The remainder of the rotator cuff appeared to be in satisfactory condition.   COMPLICATIONS: None.   ESTIMATED BLOOD LOSS: 25 mL.   TOTAL FLUIDS: 750 mL of crystalloid.   TOURNIQUET: None.   DRAINS: None.   CLOSURE: Staples.   BRIEF CLINICAL NOTE: The patient is a 74 year old female with a 6+ month history of left shoulder pain. Her symptoms have progressed despite medications, activity modification, etc. Her history and examination are consistent with impingement/tendinopathy with a probable rotator cuff tear. The MRI scan confirms the presence of a rotator cuff tear. It also demonstrates some degenerative changes of the glenohumeral joint. She presents at this time for arthroscopy, debridement, decompression, and repair of the rotator cuff tear.   DESCRIPTION OF PROCEDURE: The patient underwent placement of an interscalene block in the preoperative holding area. She was then brought into the operating room and lain in the  supine position. After adequate general endotracheal intubation and anesthesia were obtained, she was repositioned in the beach chair position using the beach chair positioner. The left shoulder and upper extremity were prepped with ChloraPrep solution before being draped sterilely. Preoperative antibiotics were administered. The expected portal sites and incision site were injected with 0.5% Sensorcaine with epinephrine before the camera was placed in the posterior portal. The glenohumeral joint was thoroughly inspected with the findings as described above. An anterior portal was created using an outside-in technique. The labrum and rotator cuff were carefully probed with the findings again as described above. The full radius resector was used to perform extensive debridement, including abrasion chondroplasty of the humeral and glenoid surfaces, debridement of extensive synovitis, and debridement of the labral fraying. The biceps tendon was released from its labral attachment using the ArthroCare wand. The ArthroCare wand also was used to debride synovial tissue, as well as to obtain hemostasis. Several small cartilaginous loose bodies were identified and removed using the full radius resector.  The instruments were removed from the joint after suctioning the excess fluid.   The camera was repositioned through the posterior portal into the subacromial space.  A separate lateral portal was created using an outside in technique. The shaver was introduced and used to perform a subtotal bursectomy. The ArthroCare wand was introduced and used to remove the periosteum off the undersurface of the anterior third of the acromion. It also was used to reassess the coracoacromial ligament from its attachment along the anterior and lateral margins of the acromion. The 4 mm acromionizing bur was then introduced and used to complete the decompression by removing the undersurface of the anterior third of the acromion. Again, the  full radius resector was reintroduced to remove any residual bony debris before the  ArthroCare wand was inserted to obtain hemostasis.  The instruments were removed from the subacromial space after suctioning the excess fluid.   An approximately 5 cm incision was made over the anterolateral aspect of the shoulder, beginning at the anterolateral corner of the acromion and extending distally.  The incision was carried down through the subcutaneous tissues to expose the deltoid fascia.  The raphe between the anterior and middle thirds was identified, and this plane developed to provide access into the subacromial space. Additional bursal tissues were debrided using Metzenbaum scissors to improve visualization. The area of tearing was readily identified. This area was debrided sharply with a #15 blade before a side-to-side repair was performed. An apparent watertight closure was obtained.   The bicipital groove was identified and the tendon unroofed. The stump was brought out into the defect. It appeared to be well scarred into the surrounding soft tissues, so it was elected to proceed with a soft tissue tenodesis. This was accomplished using two #0 Ethibond interrupted sutures, which were used to secure the biceps tendon to the adjacent soft tissues within the groove. The redundant tendon stump was removed with a  #15 blade.   The wound was copiously irrigated with sterile saline solution before the deltoid raphe was reapproximated using 2-0 Vicryl interrupted sutures. The subcutaneous tissues were closed in two layers using 2-0 Vicryl interrupted sutures before the skin was closed using staples. The portal sites also were closed using staples. A sterile bulky dressing was applied to the shoulder before the arm was placed into a shoulder immobilizer. The patient was then awakened, extubated, and returned to the recovery room in satisfactory condition after tolerating the procedure well.      ____________________________ J. Dorien Chihuahua, MD jjp:mw D: 11/12/2014 13:25:25 ET T: 11/12/2014 16:44:15 ET JOB#: [id number]  cc: Pascal Lux, MD, <Dictator> Pascal Lux MD ELECTRONICALLY SIGNED 11/13/2014 8:11

## 2015-03-04 DIAGNOSIS — M4726 Other spondylosis with radiculopathy, lumbar region: Secondary | ICD-10-CM | POA: Insufficient documentation

## 2015-03-04 DIAGNOSIS — M5126 Other intervertebral disc displacement, lumbar region: Secondary | ICD-10-CM | POA: Insufficient documentation

## 2015-03-04 DIAGNOSIS — M47816 Spondylosis without myelopathy or radiculopathy, lumbar region: Secondary | ICD-10-CM | POA: Insufficient documentation

## 2015-03-12 ENCOUNTER — Other Ambulatory Visit: Payer: Self-pay | Admitting: Surgery

## 2015-03-12 DIAGNOSIS — M5126 Other intervertebral disc displacement, lumbar region: Secondary | ICD-10-CM

## 2015-03-12 DIAGNOSIS — M4726 Other spondylosis with radiculopathy, lumbar region: Secondary | ICD-10-CM

## 2015-03-12 DIAGNOSIS — M545 Low back pain: Secondary | ICD-10-CM

## 2015-03-14 ENCOUNTER — Ambulatory Visit
Admission: RE | Admit: 2015-03-14 | Discharge: 2015-03-14 | Disposition: A | Discharge status: Home / Self Care | Payer: Medicare Other | Source: Ambulatory Visit | Attending: Surgery | Admitting: Surgery

## 2015-03-14 DIAGNOSIS — M545 Low back pain: Secondary | ICD-10-CM

## 2015-03-14 DIAGNOSIS — M4726 Other spondylosis with radiculopathy, lumbar region: Secondary | ICD-10-CM | POA: Diagnosis present

## 2015-03-14 DIAGNOSIS — M5126 Other intervertebral disc displacement, lumbar region: Secondary | ICD-10-CM | POA: Diagnosis not present

## 2015-03-14 DIAGNOSIS — M47896 Other spondylosis, lumbar region: Secondary | ICD-10-CM | POA: Diagnosis not present

## 2015-03-14 DIAGNOSIS — M4806 Spinal stenosis, lumbar region: Secondary | ICD-10-CM | POA: Insufficient documentation

## 2015-03-26 DIAGNOSIS — S86919A Strain of unspecified muscle(s) and tendon(s) at lower leg level, unspecified leg, initial encounter: Secondary | ICD-10-CM | POA: Insufficient documentation

## 2015-04-16 ENCOUNTER — Emergency Department
Admission: EM | Admit: 2015-04-16 | Discharge: 2015-04-16 | Disposition: A | Discharge status: Home / Self Care | Payer: Medicare Other | Attending: Emergency Medicine | Admitting: Emergency Medicine

## 2015-04-16 ENCOUNTER — Encounter: Payer: Self-pay | Admitting: Emergency Medicine

## 2015-04-16 DIAGNOSIS — Z87891 Personal history of nicotine dependence: Secondary | ICD-10-CM | POA: Diagnosis not present

## 2015-04-16 DIAGNOSIS — J441 Chronic obstructive pulmonary disease with (acute) exacerbation: Secondary | ICD-10-CM | POA: Diagnosis not present

## 2015-04-16 DIAGNOSIS — M5432 Sciatica, left side: Secondary | ICD-10-CM | POA: Insufficient documentation

## 2015-04-16 DIAGNOSIS — I10 Essential (primary) hypertension: Secondary | ICD-10-CM | POA: Insufficient documentation

## 2015-04-16 DIAGNOSIS — E119 Type 2 diabetes mellitus without complications: Secondary | ICD-10-CM | POA: Insufficient documentation

## 2015-04-16 DIAGNOSIS — M25562 Pain in left knee: Secondary | ICD-10-CM | POA: Diagnosis present

## 2015-04-16 HISTORY — DX: Essential (primary) hypertension: I10

## 2015-04-16 HISTORY — DX: Chronic obstructive pulmonary disease, unspecified: J44.9

## 2015-04-16 HISTORY — DX: Unspecified osteoarthritis, unspecified site: M19.90

## 2015-04-16 HISTORY — DX: Type 2 diabetes mellitus without complications: E11.9

## 2015-04-16 LAB — CBC WITH DIFFERENTIAL/PLATELET
BASOS ABS: 0.1 10*3/uL (ref 0–0.1)
BASOS PCT: 1 %
EOS PCT: 0 %
Eosinophils Absolute: 0 10*3/uL (ref 0–0.7)
HEMATOCRIT: 31 % — AB (ref 35.0–47.0)
Hemoglobin: 10.7 g/dL — ABNORMAL LOW (ref 12.0–16.0)
LYMPHS PCT: 23 %
Lymphs Abs: 2.6 10*3/uL (ref 1.0–3.6)
MCH: 31 pg (ref 26.0–34.0)
MCHC: 34.6 g/dL (ref 32.0–36.0)
MCV: 89.6 fL (ref 80.0–100.0)
MONO ABS: 0.9 10*3/uL (ref 0.2–0.9)
Monocytes Relative: 8 %
NEUTROS ABS: 7.5 10*3/uL — AB (ref 1.4–6.5)
Neutrophils Relative %: 68 %
PLATELETS: 164 10*3/uL (ref 150–440)
RBC: 3.46 MIL/uL — AB (ref 3.80–5.20)
RDW: 15.6 % — AB (ref 11.5–14.5)
WBC: 11.1 10*3/uL — AB (ref 3.6–11.0)

## 2015-04-16 LAB — COMPREHENSIVE METABOLIC PANEL
ALK PHOS: 75 U/L (ref 38–126)
ALT: 17 U/L (ref 14–54)
ANION GAP: 11 (ref 5–15)
AST: 30 U/L (ref 15–41)
Albumin: 4 g/dL (ref 3.5–5.0)
BILIRUBIN TOTAL: 0.8 mg/dL (ref 0.3–1.2)
BUN: 64 mg/dL — ABNORMAL HIGH (ref 6–20)
CALCIUM: 9.1 mg/dL (ref 8.9–10.3)
CO2: 25 mmol/L (ref 22–32)
Chloride: 99 mmol/L — ABNORMAL LOW (ref 101–111)
Creatinine, Ser: 2.68 mg/dL — ABNORMAL HIGH (ref 0.44–1.00)
GFR calc Af Amer: 19 mL/min — ABNORMAL LOW (ref >60)
GFR, EST NON AFRICAN AMERICAN: 16 mL/min — AB (ref >60)
GLUCOSE: 126 mg/dL — AB (ref 65–99)
POTASSIUM: 2.9 mmol/L — AB (ref 3.5–5.1)
Sodium: 135 mmol/L (ref 135–145)
TOTAL PROTEIN: 7.7 g/dL (ref 6.5–8.1)

## 2015-04-16 MED ORDER — POTASSIUM CHLORIDE CRYS ER 20 MEQ PO TBCR
20.0000 meq | EXTENDED_RELEASE_TABLET | Freq: Once | ORAL | Status: AC
  Administered 2015-04-16: 20 meq via ORAL
  Filled 2015-04-16: qty 1

## 2015-04-16 MED ORDER — HYDROMORPHONE HCL 2 MG PO TABS: 2.0000 mg | ORAL_TABLET | Freq: Two times a day (BID) | ORAL | Status: DC | PRN

## 2015-04-16 MED ORDER — HYDROMORPHONE HCL 1 MG/ML IJ SOLN
1.0000 mg | Freq: Once | INTRAMUSCULAR | Status: AC
  Administered 2015-04-16: 1 mg via INTRAMUSCULAR
  Filled 2015-04-16: qty 1

## 2015-04-16 NOTE — ED Provider Notes (Signed)
Time Seen: Approximately ----------------------------------------- 7:05 PM on 04/16/2015 -----------------------------------------    I have reviewed the triage notes  Chief Complaint: Knee Pain   History of Present Illness: Barbara [name] is a 74 y.o. female who presents with the history that seems to be consistent with left lower extremity sciatica. The patient states she has been seen by orthopedic surgery and has had an MRI is been instructed that she has lumbar disc disease. Patient extremely poor historian and is difficult to ascertain what studies him what information is RA been acquired but it sounds as though she is seen an orthopedic surgeon and had a prescription for pain medication called in yesterday. Patient of family check with the pharmacy but there wasn't any pain medication that of been called and so they notified EMS and she was transported here uneventfully. The pain may be worse over the last couple days since essentially is in her hip and also the anterior surface of her left knee. She denies any constant pain and it seems to come in waves. She denies any exacerbating relieving factors. She does have a history of diabetes, hypertension, COPD. She is on chronic home oxygen at 2 L and is mildly hypoxic here but denies any feelings of shortness of breath or chest pain. She denies any recent trauma to her left lower extremity.  Past Medical History  Diagnosis Date  . COPD (chronic obstructive pulmonary disease)   . Hypertension   . Diabetes mellitus without complication   . Arthritis     There are no active problems to display for this patient.   Past Surgical History  Procedure Laterality Date  . Abdominal hysterectomy    . Rotator cuff repair      left    Past Surgical History  Procedure Laterality Date  . Abdominal hysterectomy    . Rotator cuff repair      left    No current outpatient prescriptions on file.  Allergies:  Percocet  Family  History: History reviewed. No pertinent family history.  Social History: Social History  Substance Use Topics  . Smoking status: Former Research scientist (life sciences)  . Smokeless tobacco: None  . Alcohol Use: No     Review of Systems:   10 point review of systems was performed and was otherwise negative:  Constitutional: No fever Eyes: No visual disturbances ENT: No sore throat, ear pain Cardiac: No chest pain Respiratory: No shortness of breath, wheezing, or stridor Abdomen: No abdominal pain, no vomiting, No diarrhea Endocrine: No weight loss, No night sweats Extremities: No peripheral edema, cyanosis Skin: No rashes, easy bruising Neurologic: No focal weakness, trouble with speech or swollowing Urologic: No dysuria, Hematuria, or urinary frequency   Physical Exam:  ED Triage Vitals  Enc Vitals Group     BP 04/16/15 1847 120/71 mmHg     Pulse Rate 04/16/15 1847 100     Resp 04/16/15 1847 28     Temp 04/16/15 1847 99.3 F (37.4 C)     Temp Source 04/16/15 1847 Oral     SpO2 04/16/15 1847 85 %     Weight 04/16/15 1847 227 lb 6.4 oz (103.148 kg)     Height 04/16/15 1847 5' 8"  (1.727 m)     Head Cir --      Peak Flow --      Pain Score 04/16/15 1848 10     Pain Loc --      Pain Edu? --      Excl. in  GC? --     General: Awake , Alert , and Oriented times 3; GCS 15 Head: Normal cephalic , atraumatic Eyes: Pupils equal , round, reactive to light Nose/Throat: No nasal drainage, patent upper airway without erythema or exudate.  Neck: Supple, Full range of motion, No anterior adenopathy or palpable thyroid masses Lungs: Clear to ascultation without wheezes , rhonchi, or rales Heart: Regular rate, regular rhythm without murmurs , gallops , or rubs Abdomen: Obese Soft, non tender without rebound, guarding , or rigidity; bowel sounds positive and symmetric in all 4 quadrants. No organomegaly .        Extremities: 2 plus symmetric pulses. No edema, clubbing or cyanosis Neurologic: normal  ambulation, Motor symmetric without deficits, sensory intact Skin: warm, dry, no rashes Examination of the back shows no reproducible pain with direct palpation. She does have reproducible pain with left straight leg raise. She has difficulty raising her left lower extremity off the stretcher due to pain and some mild weakness.  Labs:   All laboratory work was reviewed including any pertinent negatives or positives listed below:  Labs Reviewed  COMPREHENSIVE METABOLIC PANEL  CBC WITH DIFFERENTIAL/PLATELET   Review of laboratory work shows some mild hypokalemia.    ED Course: Patient's stay here showed symptomatic improvement with a IM shot of Dilaudid. She'll be discharged on oral Dilaudid to take as needed. Patient's been advised to contact her orthopedic surgeon to see what discharge medication he had planned. She does not appear to have any caudal equina syndrome. Patient had some mild hypoxia here but is on home O2 at 2-3 L.*    Assessment: Sciatica  Final Clinical Impression:  sciatica Final diagnoses:  None     Plan: Outpatient management Patient was advised to return immediately if condition worsens. Patient was advised to follow up with her primary care physician or other specialized physicians involved and in their current assessment.            Daymon Larsen, MD 04/16/15 2106

## 2015-04-16 NOTE — ED Notes (Signed)
Pt states no pain now, but was 10/10 at home.  Pt states "I might have over done it on Sunday".

## 2015-04-16 NOTE — Discharge Instructions (Signed)
Sciatica Sciatica is pain, weakness, numbness, or tingling along the path of the sciatic nerve. The nerve starts in the lower back and runs down the back of each leg. The nerve controls the muscles in the lower leg and in the back of the knee, while also providing sensation to the back of the thigh, lower leg, and the sole of your foot. Sciatica is a symptom of another medical condition. For instance, nerve damage or certain conditions, such as a herniated disk or bone spur on the spine, pinch or put pressure on the sciatic nerve. This causes the pain, weakness, or other sensations normally associated with sciatica. Generally, sciatica only affects one side of the body. CAUSES   Herniated or slipped disc.  Degenerative disk disease.  A pain disorder involving the narrow muscle in the buttocks (piriformis syndrome).  Pelvic injury or fracture.  Pregnancy.  Tumor (rare). SYMPTOMS  Symptoms can vary from mild to very severe. The symptoms usually travel from the low back to the buttocks and down the back of the leg. Symptoms can include:  Mild tingling or dull aches in the lower back, leg, or hip.  Numbness in the back of the calf or sole of the foot.  Burning sensations in the lower back, leg, or hip.  Sharp pains in the lower back, leg, or hip.  Leg weakness.  Severe back pain inhibiting movement. These symptoms may get worse with coughing, sneezing, laughing, or prolonged sitting or standing. Also, being overweight may worsen symptoms. DIAGNOSIS  Your caregiver will perform a physical exam to look for common symptoms of sciatica. He or she may ask you to do certain movements or activities that would trigger sciatic nerve pain. Other tests may be performed to find the cause of the sciatica. These may include:  Blood tests.  X-rays.  Imaging tests, such as an MRI or CT scan. TREATMENT  Treatment is directed at the cause of the sciatic pain. Sometimes, treatment is not necessary  and the pain and discomfort goes away on its own. If treatment is needed, your caregiver may suggest:  Over-the-counter medicines to relieve pain.  Prescription medicines, such as anti-inflammatory medicine, muscle relaxants, or narcotics.  Applying heat or ice to the painful area.  Steroid injections to lessen pain, irritation, and inflammation around the nerve.  Reducing activity during periods of pain.  Exercising and stretching to strengthen your abdomen and improve flexibility of your spine. Your caregiver may suggest losing weight if the extra weight makes the back pain worse.  Physical therapy.  Surgery to eliminate what is pressing or pinching the nerve, such as a bone spur or part of a herniated disk. HOME CARE INSTRUCTIONS   Only take over-the-counter or prescription medicines for pain or discomfort as directed by your caregiver.  Apply ice to the affected area for 20 minutes, 3-4 times a day for the first 48-72 hours. Then try heat in the same way.  Exercise, stretch, or perform your usual activities if these do not aggravate your pain.  Attend physical therapy sessions as directed by your caregiver.  Keep all follow-up appointments as directed by your caregiver.  Do not wear high heels or shoes that do not provide proper support.  Check your mattress to see if it is too soft. A firm mattress may lessen your pain and discomfort. SEEK IMMEDIATE MEDICAL CARE IF:   You lose control of your bowel or bladder (incontinence).  You have increasing weakness in the lower back, pelvis, buttocks,   or legs.  You have redness or swelling of your back.  You have a burning sensation when you urinate.  You have pain that gets worse when you lie down or awakens you at night.  Your pain is worse than you have experienced in the past.  Your pain is lasting longer than 4 weeks.  You are suddenly losing weight without reason. MAKE SURE YOU:  Understand these  instructions.  Will watch your condition.  Will get help right away if you are not doing well or get worse. Document Released: 06/30/2001 Document Revised: 01/05/2012 Document Reviewed: 11/15/2011 [employer] Patient Information 2015 [employer]. This information is not intended to replace advice given to you by your health care provider. Make sure you discuss any questions you have with your health care provider.  

## 2015-04-16 NOTE — ED Notes (Signed)
Pt to ED from home via EMS c/o left knee pain x2 weeks worsening since Friday.  Pt states pain radiates to left groin.  Pt denies injury to knee.  Pt treating with OTC tylenol at home with some relief.  Pt normally walks at home but unable to bear weight on left leg.  Pt states PCP was to call in in prescription for pain and when family checked no medication was ready.  Pt presents with skin warm and dry, no swelling or deformity noted.  Pt has hx of DBM, HTN, COPD, arthritis.  Pt is A&Ox4, speaking in complete and coherent sentences and in NAD at this time.

## 2015-05-10 DIAGNOSIS — M179 Osteoarthritis of knee, unspecified: Secondary | ICD-10-CM | POA: Insufficient documentation

## 2015-05-10 DIAGNOSIS — M1710 Unilateral primary osteoarthritis, unspecified knee: Secondary | ICD-10-CM | POA: Insufficient documentation

## 2015-06-18 ENCOUNTER — Other Ambulatory Visit: Payer: Self-pay | Admitting: Internal Medicine

## 2015-06-18 DIAGNOSIS — Z1231 Encounter for screening mammogram for malignant neoplasm of breast: Secondary | ICD-10-CM

## 2015-06-25 ENCOUNTER — Ambulatory Visit
Admission: RE | Admit: 2015-06-25 | Discharge: 2015-06-25 | Disposition: A | Discharge status: Home / Self Care | Payer: Medicare Other | Source: Ambulatory Visit | Attending: Internal Medicine | Admitting: Internal Medicine

## 2015-06-25 DIAGNOSIS — Z1231 Encounter for screening mammogram for malignant neoplasm of breast: Secondary | ICD-10-CM | POA: Diagnosis not present

## 2015-07-21 DIAGNOSIS — D631 Anemia in chronic kidney disease: Secondary | ICD-10-CM

## 2015-07-21 DIAGNOSIS — N189 Chronic kidney disease, unspecified: Secondary | ICD-10-CM

## 2015-07-21 DIAGNOSIS — C801 Malignant (primary) neoplasm, unspecified: Secondary | ICD-10-CM

## 2015-07-21 DIAGNOSIS — Z9221 Personal history of antineoplastic chemotherapy: Secondary | ICD-10-CM

## 2015-07-21 DIAGNOSIS — Z923 Personal history of irradiation: Secondary | ICD-10-CM

## 2015-07-21 HISTORY — DX: Anemia in chronic kidney disease: D63.1

## 2015-07-21 HISTORY — DX: Personal history of irradiation: Z92.3

## 2015-07-21 HISTORY — DX: Malignant (primary) neoplasm, unspecified: C80.1

## 2015-07-21 HISTORY — DX: Chronic kidney disease, unspecified: N18.9

## 2015-07-21 HISTORY — DX: Personal history of antineoplastic chemotherapy: Z92.21

## 2015-12-17 ENCOUNTER — Emergency Department: Payer: Medicare Other

## 2015-12-17 ENCOUNTER — Inpatient Hospital Stay
Admission: EM | Admit: 2015-12-17 | Discharge: 2015-12-23 | DRG: 871 | Disposition: A | Discharge status: Skilled Nursing Facility | Payer: Medicare Other | Attending: Internal Medicine | Admitting: Internal Medicine

## 2015-12-17 DIAGNOSIS — N390 Urinary tract infection, site not specified: Secondary | ICD-10-CM | POA: Diagnosis not present

## 2015-12-17 DIAGNOSIS — D649 Anemia, unspecified: Secondary | ICD-10-CM | POA: Diagnosis present

## 2015-12-17 DIAGNOSIS — R609 Edema, unspecified: Secondary | ICD-10-CM

## 2015-12-17 DIAGNOSIS — R110 Nausea: Secondary | ICD-10-CM | POA: Diagnosis not present

## 2015-12-17 DIAGNOSIS — N184 Chronic kidney disease, stage 4 (severe): Secondary | ICD-10-CM | POA: Diagnosis present

## 2015-12-17 DIAGNOSIS — M25551 Pain in right hip: Secondary | ICD-10-CM | POA: Diagnosis present

## 2015-12-17 DIAGNOSIS — N179 Acute kidney failure, unspecified: Secondary | ICD-10-CM | POA: Diagnosis present

## 2015-12-17 DIAGNOSIS — B9620 Unspecified Escherichia coli [E. coli] as the cause of diseases classified elsewhere: Secondary | ICD-10-CM | POA: Diagnosis present

## 2015-12-17 DIAGNOSIS — E1122 Type 2 diabetes mellitus with diabetic chronic kidney disease: Secondary | ICD-10-CM | POA: Diagnosis present

## 2015-12-17 DIAGNOSIS — K219 Gastro-esophageal reflux disease without esophagitis: Secondary | ICD-10-CM | POA: Diagnosis present

## 2015-12-17 DIAGNOSIS — M19072 Primary osteoarthritis, left ankle and foot: Secondary | ICD-10-CM | POA: Diagnosis present

## 2015-12-17 DIAGNOSIS — M109 Gout, unspecified: Secondary | ICD-10-CM | POA: Diagnosis present

## 2015-12-17 DIAGNOSIS — M1990 Unspecified osteoarthritis, unspecified site: Secondary | ICD-10-CM | POA: Diagnosis present

## 2015-12-17 DIAGNOSIS — E1140 Type 2 diabetes mellitus with diabetic neuropathy, unspecified: Secondary | ICD-10-CM | POA: Diagnosis present

## 2015-12-17 DIAGNOSIS — R52 Pain, unspecified: Secondary | ICD-10-CM

## 2015-12-17 DIAGNOSIS — K625 Hemorrhage of anus and rectum: Secondary | ICD-10-CM | POA: Diagnosis present

## 2015-12-17 DIAGNOSIS — A419 Sepsis, unspecified organism: Secondary | ICD-10-CM | POA: Diagnosis not present

## 2015-12-17 DIAGNOSIS — I129 Hypertensive chronic kidney disease with stage 1 through stage 4 chronic kidney disease, or unspecified chronic kidney disease: Secondary | ICD-10-CM | POA: Diagnosis present

## 2015-12-17 DIAGNOSIS — Z9181 History of falling: Secondary | ICD-10-CM

## 2015-12-17 DIAGNOSIS — J449 Chronic obstructive pulmonary disease, unspecified: Secondary | ICD-10-CM | POA: Diagnosis present

## 2015-12-17 DIAGNOSIS — Z886 Allergy status to analgesic agent status: Secondary | ICD-10-CM

## 2015-12-17 DIAGNOSIS — R6520 Severe sepsis without septic shock: Secondary | ICD-10-CM | POA: Diagnosis present

## 2015-12-17 DIAGNOSIS — M25461 Effusion, right knee: Secondary | ICD-10-CM | POA: Diagnosis present

## 2015-12-17 DIAGNOSIS — Z87891 Personal history of nicotine dependence: Secondary | ICD-10-CM

## 2015-12-17 DIAGNOSIS — G9341 Metabolic encephalopathy: Secondary | ICD-10-CM | POA: Diagnosis present

## 2015-12-17 LAB — COMPREHENSIVE METABOLIC PANEL
ALK PHOS: 75 U/L (ref 38–126)
ALT: 40 U/L (ref 14–54)
ANION GAP: 14 (ref 5–15)
AST: 96 U/L — ABNORMAL HIGH (ref 15–41)
Albumin: 3.8 g/dL (ref 3.5–5.0)
BILIRUBIN TOTAL: 1.1 mg/dL (ref 0.3–1.2)
BUN: 77 mg/dL — ABNORMAL HIGH (ref 6–20)
CALCIUM: 9.6 mg/dL (ref 8.9–10.3)
CO2: 21 mmol/L — ABNORMAL LOW (ref 22–32)
Chloride: 103 mmol/L (ref 101–111)
Creatinine, Ser: 3.55 mg/dL — ABNORMAL HIGH (ref 0.44–1.00)
GFR calc non Af Amer: 12 mL/min — ABNORMAL LOW (ref >60)
GFR, EST AFRICAN AMERICAN: 13 mL/min — AB (ref >60)
Glucose, Bld: 158 mg/dL — ABNORMAL HIGH (ref 65–99)
Potassium: 3.9 mmol/L (ref 3.5–5.1)
Sodium: 138 mmol/L (ref 135–145)
TOTAL PROTEIN: 9 g/dL — AB (ref 6.5–8.1)

## 2015-12-17 LAB — URINALYSIS COMPLETE WITH MICROSCOPIC (ARMC ONLY)
BILIRUBIN URINE: NEGATIVE
GLUCOSE, UA: NEGATIVE mg/dL
Ketones, ur: NEGATIVE mg/dL
NITRITE: NEGATIVE
Protein, ur: 100 mg/dL — AB
SPECIFIC GRAVITY, URINE: 1.012 (ref 1.005–1.030)
pH: 5 (ref 5.0–8.0)

## 2015-12-17 LAB — CBC
HEMATOCRIT: 28 % — AB (ref 35.0–47.0)
HEMOGLOBIN: 9.4 g/dL — AB (ref 12.0–16.0)
MCH: 28.3 pg (ref 26.0–34.0)
MCHC: 33.6 g/dL (ref 32.0–36.0)
MCV: 84.3 fL (ref 80.0–100.0)
Platelets: 343 10*3/uL (ref 150–440)
RBC: 3.32 MIL/uL — AB (ref 3.80–5.20)
RDW: 14.6 % — ABNORMAL HIGH (ref 11.5–14.5)
WBC: 19.9 10*3/uL — ABNORMAL HIGH (ref 3.6–11.0)

## 2015-12-17 LAB — LACTIC ACID, PLASMA: Lactic Acid, Venous: 1.4 mmol/L (ref 0.5–2.0)

## 2015-12-17 MED ORDER — SODIUM CHLORIDE 0.9 % IV BOLUS (SEPSIS)
1000.0000 mL | Freq: Once | INTRAVENOUS | Status: AC
  Administered 2015-12-17: 1000 mL via INTRAVENOUS

## 2015-12-17 MED ORDER — SODIUM CHLORIDE 0.9 % IV BOLUS (SEPSIS)
1000.0000 mL | Freq: Once | INTRAVENOUS | Status: AC
  Administered 2015-12-18: 1000 mL via INTRAVENOUS

## 2015-12-17 MED ORDER — ACETAMINOPHEN 500 MG PO TABS
1000.0000 mg | ORAL_TABLET | Freq: Once | ORAL | Status: AC
  Administered 2015-12-17: 1000 mg via ORAL

## 2015-12-17 MED ORDER — CEFTRIAXONE SODIUM 2 G IJ SOLR
2.0000 g | Freq: Once | INTRAMUSCULAR | Status: AC
  Administered 2015-12-17: 2 g via INTRAVENOUS
  Filled 2015-12-17: qty 2

## 2015-12-17 NOTE — ED Notes (Addendum)
Unable to run fluids 2000cc/hour due to size of IV catheter in place, MD aware.

## 2015-12-17 NOTE — ED Notes (Signed)
X-ray at bedside

## 2015-12-17 NOTE — ED Notes (Addendum)
Charge RN at bedside to assess placement for another IV. Family at pt bedside.   Unable to obtain redraw of purple top at this time. First CBC drawn was clotted per lab staff.

## 2015-12-17 NOTE — ED Notes (Signed)
Patient transported to CT 

## 2015-12-17 NOTE — ED Provider Notes (Signed)
[facility] Emergency Department Provider Note ____________________________________________  Time seen: Approximately 9:26 PM  I have reviewed the triage vital signs and the nursing notes.   HISTORY Limited by altered mental status Chief Complaint Altered Mental Status and Rectal Bleeding  HPI Barbara [name] is a 75 y.o. female with a history of COPD, diabetes, hypertension who presents from home with altered mental status.  Also reports having a tarry stool on Monday. Patient feels very uncomfortable but is not able to give a complete history. She denies chest pain, abdominal pain, back pain. States she has had some intermittent headaches. States she doesn't feel well. She does have pain left lateral ankle.  Granddaughter provides additional history. She states her grandmother has not been well for the past week and has not been eating and drinking. She states she has not taken her medicines in about a week. Initially she said it was just her gout. 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 Family reports she has fallen multiple times. They do not think she sustained injury but at one point was found with a towel on her head.  Past Medical History  Diagnosis Date  . COPD (chronic obstructive pulmonary disease) ([place])   . Hypertension   . Diabetes mellitus without complication ([place])   . Arthritis     There are no active problems to display for this patient.   Past Surgical History  Procedure Laterality Date  . Abdominal hysterectomy    . Rotator cuff repair      left    Current Outpatient Rx  Name  Route  Sig  Dispense  Refill  . HYDROmorphone (DILAUDID) 2 MG tablet   Oral   Take 1 tablet (2 mg total) by mouth every 12 (twelve) hours as needed for severe pain.   20 tablet   0     Allergies Percocet  Family History  Problem Relation Age of Onset  . Breast cancer Neg Hx     Social History Social History  Substance Use Topics   . Smoking status: Former Research scientist (life sciences)  . Smokeless tobacco: None  . Alcohol Use: No    Review of Systems Constitutional: No fever ENT: No sore throat. Cardiovascular: Denies chest pain. Respiratory: Appear short of breath; chronically on 4 L of O2 at home Gastrointestinal: No abdominal pain.  No nausea, no vomiting.   Genitourinary: Malodorous urine on arrival Musculoskeletal: Negative for back pain. Skin: Negative for rash. Neurological: Occasional intermittent headaches  10-point ROS otherwise negative.  ____________________________________________   PHYSICAL EXAM:  VITAL SIGNS: ED Triage Vitals  Enc Vitals Group     BP 12/17/15 2101 154/69 mmHg     Pulse Rate 12/17/15 2101 106     Resp 12/17/15 2101 26     Temp 12/17/15 2101 100.3 F (37.9 C)     Temp Source 12/17/15 2101 Oral     SpO2 12/17/15 2101 94 %     Weight 12/17/15 2101 201 lb 11.5 oz (91.5 kg)     Height --      Head Cir --      Peak Flow --      Pain Score 12/17/15 2102 10     Pain Loc --      Pain Edu? --      Excl. in [place]? --    Constitutional: Alert; Appears to feel unwell. Eyes: Conjunctivae are normal. PERRL. EOMI. Head: Atraumatic. Nose: No congestion/rhinnorhea. Mouth/Throat: Mucous membranes are dry.  Oropharynx non-erythematous. Neck: No stridor.   Cardiovascular: mild tachycardia. Grossly normal heart sounds.  Good peripheral circulation. Respiratory: tachypneic with suprasternal retractions.  Lungs CTAB. Gastrointestinal: Soft and nontender. No distention. No abdominal bruits. No CVA tenderness. Musculoskeletal: No lower extremity tenderness nor edema.   Neurologic:  Normal speech and language. No gross focal neurologic deficits are appreciated. Skin:  Skin is warm, dry and intact. No rash noted. Psychiatric: Mood and affect are normal. Speech and behavior are normal.  ____________________________________________   LABS (all labs ordered are listed, but only abnormal results are  displayed)  Labs Reviewed  COMPREHENSIVE METABOLIC PANEL - Abnormal; Notable for the following:    CO2 21 (*)    Glucose, Bld 158 (*)    BUN 77 (*)    Creatinine, Ser 3.55 (*)    Total Protein 9.0 (*)    AST 96 (*)    GFR calc non Af Amer 12 (*)    GFR calc Af Amer 13 (*)    All other components within normal limits  URINALYSIS COMPLETEWITH MICROSCOPIC ([facility] ONLY) - Abnormal; Notable for the following:    Color, Urine YELLOW (*)    APPearance TURBID (*)    Hgb urine dipstick 3+ (*)    Protein, ur 100 (*)    Leukocytes, UA 3+ (*)    Bacteria, UA MANY (*)    Squamous Epithelial / LPF 0-5 (*)    All other components within normal limits  BLOOD GAS, VENOUS - Abnormal; Notable for the following:    pCO2, Ven 38 (*)    Acid-base deficit 2.3 (*)    All other components within normal limits  CBC - Abnormal; Notable for the following:    WBC 19.9 (*)    RBC 3.32 (*)    Hemoglobin 9.4 (*)    HCT 28.0 (*)    RDW 14.6 (*)    All other components within normal limits  CULTURE, BLOOD (ROUTINE X 2)  CULTURE, BLOOD (ROUTINE X 2)  URINE CULTURE  LACTIC ACID, PLASMA  LACTIC ACID, PLASMA   Cr baseline 2.4-2.68, last 9/16 ____________________________________________  EKG  ED ECG REPORT I, Fumie Fiallo,  Erric Machnik, the attending physician, personally viewed and interpreted this ECG.   Date: 12/17/2015  EKG Time: 2100  Rate:106  Rhythm:sinus tach  Axis: nl  Intervals:nl  ST&T Change: nl  ____________________________________________  RADIOLOGY  Chest X-ray- IMPRESSION: No evidence of acute cardiopulmonary disease.  Electronically Signed By: Julian Hy M.D. On: 12/17/2015 22:39  R ankle xray-IMPRESSION: No fracture or dislocation is seen.  Mild degenerative changes along the medial tibiotalar joint. Overlying soft tissue swelling.  Electronically Signed By: Julian Hy M.D. On: 12/17/2015 22:43  CT head-IMPRESSION: 1. No acute intracranial  pathology seen on CT. 2. Mild small vessel ischemic microangiopathy. 3. 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  Electronically Signed By: Garald Balding M.D. On: 12/17/2015 23:29 ____________________________________________   PROCEDURES CRITICAL CARE Performed by: Ponciano Ort  Total critical care time: 30 minutes  Critical care time was exclusive of separately billable procedures and treating other patients.  Critical care was necessary to treat or prevent imminent or life-threatening deterioration.  Critical care was time spent personally by me on the following activities: development of treatment plan with patient and/or surrogate as well as nursing, discussions with consultants, evaluation of patient's response to treatment, examination of patient, obtaining history from patient or surrogate, ordering and performing treatments and interventions, ordering and review of laboratory studies, ordering and  review of radiographic studies, pulse oximetry and re-evaluation of patient's condition.  ____________________________________________   INITIAL IMPRESSION / ASSESSMENT AND PLAN / ED COURSE  Pertinent labs & imaging results that were available during my care of the patient were reviewed by me and considered in my medical decision making (see chart for details).  Patient rectal temp checked during my exam. She is febrile to 102.9. Code sepsis order placed. Presumed source is urinary while workup is pending.  ----------------------------------------- 10:18 PM on 12/17/2015 -----------------------------------------  Spouse and granddaughter at bedside. They are updated with patient's status.  Filed Vitals:   12/17/15 2132 12/17/15 2332  BP:  131/62  Pulse:  94  Temp: 102.9 F (39.4 C)   Resp:  20    ____________________________________________   FINAL CLINICAL IMPRESSION(S) / ED DIAGNOSES  Final diagnoses:  Sepsis, due to unspecified  organism [facility])  UTI (lower urinary tract infection)  Acute kidney injury R ankle pain/inflammation   New prescriptions started this visit New Prescriptions   No medications on file     Ponciano Ort, MD 12/18/15 0000

## 2015-12-17 NOTE — ED Notes (Signed)
PT arrives to ER via [facility] from home with cc of AMS and black tarry stool X 1 (monday). Pt foul smell of urine upon arrival. Pt states that she is "just tired". Gout flare to left foot, only complaint of pain is foot at this time. CBG with [facility] was 114. Pt has not had an appetite X 3 days. Pt family reports AMS, pt alert and oriented X 4 at time of arrival.

## 2015-12-17 NOTE — ED Notes (Signed)
IV attempt unsuccessful

## 2015-12-17 NOTE — ED Notes (Signed)
MD at bedside. 

## 2015-12-17 NOTE — ED Notes (Signed)
Pt stuck for IV access X 2 by charge RN, unsuccessful.

## 2015-12-17 NOTE — ED Notes (Addendum)
Pt informed that only 1 IV access can be obtained and fluids will have to be run at a slower rate to keep IV access. MD acknowledges.   Continuing to attempt to redraw CBC.  Charge RN aware.

## 2015-12-18 ENCOUNTER — Encounter: Payer: Self-pay | Admitting: Internal Medicine

## 2015-12-18 ENCOUNTER — Inpatient Hospital Stay: Payer: Medicare Other

## 2015-12-18 DIAGNOSIS — Z87891 Personal history of nicotine dependence: Secondary | ICD-10-CM | POA: Diagnosis not present

## 2015-12-18 DIAGNOSIS — G9341 Metabolic encephalopathy: Secondary | ICD-10-CM | POA: Diagnosis present

## 2015-12-18 DIAGNOSIS — N390 Urinary tract infection, site not specified: Secondary | ICD-10-CM | POA: Diagnosis present

## 2015-12-18 DIAGNOSIS — D649 Anemia, unspecified: Secondary | ICD-10-CM | POA: Diagnosis present

## 2015-12-18 DIAGNOSIS — M25461 Effusion, right knee: Secondary | ICD-10-CM | POA: Diagnosis present

## 2015-12-18 DIAGNOSIS — B9620 Unspecified Escherichia coli [E. coli] as the cause of diseases classified elsewhere: Secondary | ICD-10-CM | POA: Diagnosis present

## 2015-12-18 DIAGNOSIS — Z9181 History of falling: Secondary | ICD-10-CM | POA: Diagnosis not present

## 2015-12-18 DIAGNOSIS — M1990 Unspecified osteoarthritis, unspecified site: Secondary | ICD-10-CM | POA: Diagnosis present

## 2015-12-18 DIAGNOSIS — M19072 Primary osteoarthritis, left ankle and foot: Secondary | ICD-10-CM | POA: Diagnosis present

## 2015-12-18 DIAGNOSIS — K219 Gastro-esophageal reflux disease without esophagitis: Secondary | ICD-10-CM | POA: Diagnosis present

## 2015-12-18 DIAGNOSIS — A419 Sepsis, unspecified organism: Secondary | ICD-10-CM | POA: Diagnosis present

## 2015-12-18 DIAGNOSIS — I129 Hypertensive chronic kidney disease with stage 1 through stage 4 chronic kidney disease, or unspecified chronic kidney disease: Secondary | ICD-10-CM | POA: Diagnosis present

## 2015-12-18 DIAGNOSIS — E1140 Type 2 diabetes mellitus with diabetic neuropathy, unspecified: Secondary | ICD-10-CM | POA: Diagnosis present

## 2015-12-18 DIAGNOSIS — N184 Chronic kidney disease, stage 4 (severe): Secondary | ICD-10-CM | POA: Diagnosis present

## 2015-12-18 DIAGNOSIS — J449 Chronic obstructive pulmonary disease, unspecified: Secondary | ICD-10-CM | POA: Diagnosis present

## 2015-12-18 DIAGNOSIS — M109 Gout, unspecified: Secondary | ICD-10-CM | POA: Diagnosis present

## 2015-12-18 DIAGNOSIS — K625 Hemorrhage of anus and rectum: Secondary | ICD-10-CM | POA: Diagnosis present

## 2015-12-18 DIAGNOSIS — R6520 Severe sepsis without septic shock: Secondary | ICD-10-CM | POA: Diagnosis present

## 2015-12-18 DIAGNOSIS — R110 Nausea: Secondary | ICD-10-CM | POA: Diagnosis not present

## 2015-12-18 DIAGNOSIS — Z886 Allergy status to analgesic agent status: Secondary | ICD-10-CM | POA: Diagnosis not present

## 2015-12-18 DIAGNOSIS — E1122 Type 2 diabetes mellitus with diabetic chronic kidney disease: Secondary | ICD-10-CM | POA: Diagnosis present

## 2015-12-18 DIAGNOSIS — M25551 Pain in right hip: Secondary | ICD-10-CM | POA: Diagnosis present

## 2015-12-18 DIAGNOSIS — N179 Acute kidney failure, unspecified: Secondary | ICD-10-CM | POA: Diagnosis present

## 2015-12-18 LAB — BASIC METABOLIC PANEL
ANION GAP: 11 (ref 5–15)
BUN: 71 mg/dL — ABNORMAL HIGH (ref 6–20)
CHLORIDE: 110 mmol/L (ref 101–111)
CO2: 20 mmol/L — AB (ref 22–32)
Calcium: 8.8 mg/dL — ABNORMAL LOW (ref 8.9–10.3)
Creatinine, Ser: 3.23 mg/dL — ABNORMAL HIGH (ref 0.44–1.00)
GFR calc Af Amer: 15 mL/min — ABNORMAL LOW (ref >60)
GFR, EST NON AFRICAN AMERICAN: 13 mL/min — AB (ref >60)
GLUCOSE: 135 mg/dL — AB (ref 65–99)
POTASSIUM: 3.7 mmol/L (ref 3.5–5.1)
Sodium: 141 mmol/L (ref 135–145)

## 2015-12-18 LAB — CBC
HEMATOCRIT: 27.9 % — AB (ref 35.0–47.0)
HEMOGLOBIN: 8.9 g/dL — AB (ref 12.0–16.0)
MCH: 28.1 pg (ref 26.0–34.0)
MCHC: 32 g/dL (ref 32.0–36.0)
MCV: 87.7 fL (ref 80.0–100.0)
Platelets: 331 10*3/uL (ref 150–440)
RBC: 3.18 MIL/uL — ABNORMAL LOW (ref 3.80–5.20)
RDW: 14.8 % — ABNORMAL HIGH (ref 11.5–14.5)
WBC: 17.4 10*3/uL — ABNORMAL HIGH (ref 3.6–11.0)

## 2015-12-18 LAB — LACTIC ACID, PLASMA: Lactic Acid, Venous: 0.9 mmol/L (ref 0.5–2.0)

## 2015-12-18 LAB — GLUCOSE, CAPILLARY
GLUCOSE-CAPILLARY: 128 mg/dL — AB (ref 65–99)
GLUCOSE-CAPILLARY: 121 mg/dL — AB (ref 65–99)
Glucose-Capillary: 98 mg/dL (ref 65–99)

## 2015-12-18 LAB — C DIFFICILE QUICK SCREEN W PCR REFLEX
C DIFFICILE (CDIFF) INTERP: NEGATIVE
C DIFFICILE (CDIFF) TOXIN: NEGATIVE
C DIFFICLE (CDIFF) ANTIGEN: NEGATIVE

## 2015-12-18 MED ORDER — SODIUM CHLORIDE 0.9% FLUSH
3.0000 mL | Freq: Two times a day (BID) | INTRAVENOUS | Status: DC
  Administered 2015-12-18 – 2015-12-21 (×4): 3 mL via INTRAVENOUS

## 2015-12-18 MED ORDER — INSULIN ASPART 100 UNIT/ML ~~LOC~~ SOLN
0.0000 [IU] | Freq: Three times a day (TID) | SUBCUTANEOUS | Status: DC
  Administered 2015-12-18 – 2015-12-21 (×5): 3 [IU] via SUBCUTANEOUS
  Filled 2015-12-18 (×4): qty 3

## 2015-12-18 MED ORDER — SODIUM CHLORIDE 0.9 % IV SOLN
INTRAVENOUS | Status: DC
Start: 2015-12-18 — End: 2015-12-22
  Administered 2015-12-18 – 2015-12-22 (×7): via INTRAVENOUS

## 2015-12-18 MED ORDER — SENNOSIDES-DOCUSATE SODIUM 8.6-50 MG PO TABS: 1.0000 | ORAL_TABLET | Freq: Every evening | ORAL | Status: DC | PRN

## 2015-12-18 MED ORDER — ACETAMINOPHEN 325 MG PO TABS
650.0000 mg | ORAL_TABLET | Freq: Four times a day (QID) | ORAL | Status: DC | PRN
  Administered 2015-12-18 – 2015-12-20 (×5): 650 mg via ORAL
  Filled 2015-12-18 (×5): qty 2

## 2015-12-18 MED ORDER — ACETAMINOPHEN 650 MG RE SUPP: 650.0000 mg | Freq: Four times a day (QID) | RECTAL | Status: DC | PRN

## 2015-12-18 MED ORDER — INSULIN ASPART 100 UNIT/ML ~~LOC~~ SOLN: 0.0000 [IU] | Freq: Every day | SUBCUTANEOUS | Status: DC

## 2015-12-18 MED ORDER — ONDANSETRON HCL 4 MG PO TABS: 4.0000 mg | ORAL_TABLET | Freq: Four times a day (QID) | ORAL | Status: DC | PRN

## 2015-12-18 MED ORDER — DEXTROSE 5 % IV SOLN
2.0000 g | INTRAVENOUS | Status: DC
  Administered 2015-12-18 – 2015-12-19 (×2): 2 g via INTRAVENOUS
  Filled 2015-12-18 (×3): qty 2

## 2015-12-18 MED ORDER — MORPHINE SULFATE (PF) 2 MG/ML IV SOLN
2.0000 mg | INTRAVENOUS | Status: DC | PRN
  Administered 2015-12-18 (×2): 2 mg via INTRAVENOUS

## 2015-12-18 MED ORDER — ONDANSETRON HCL 4 MG/2ML IJ SOLN: 4.0000 mg | Freq: Four times a day (QID) | INTRAMUSCULAR | Status: DC | PRN

## 2015-12-18 NOTE — Progress Notes (Signed)
[place] NAME: Barbara [name]    MR#:  [id number]  DATE OF BIRTH:  1940/08/12  SUBJECTIVE:admitted for fever/confusion,/rectal bleeding. Pt had fever yesterday but none now.alert,oriented.says she feels better.but has right Knee swelling and difficulty with ROM,had a fall at home few days ago,  CHIEF COMPLAINT:   Chief Complaint  Patient presents with  . Altered Mental Status  . Rectal Bleeding    REVIEW OF SYSTEMS:   ROS CONSTITUTIONAL: No fever, fatigue or weakness.  EYES: No blurred or double vision.  EARS, NOSE, AND THROAT: No tinnitus or ear pain.  RESPIRATORY: No cough, shortness of breath, wheezing or hemoptysis.  CARDIOVASCULAR: No chest pain, orthopnea, edema.  GASTROINTESTINAL: No nausea, vomiting, diarrhea or abdominal pain.  GENITOURINARY: No dysuria, hematuria.  ENDOCRINE: No polyuria, nocturia,  HEMATOLOGY: No anemia, easy bruising or bleeding SKIN: No rash or lesion. MUSCULOSKELETAL:  Right knee effusion. Lifting the right leg causing pain.right knee effusion. present NEUROLOGIC: No tingling, numbness, weakness.  PSYCHIATRY: No anxiety or depression.   DRUG ALLERGIES:   Allergies  Allergen Reactions  . Percocet [Oxycodone-Acetaminophen] Hives    VITALS:  Blood pressure 132/53, pulse 78, temperature 98.3 F (36.8 C), temperature source Oral, resp. rate 20, height 5' 6"  (1.676 m), weight 95.528 kg (210 lb 9.6 oz), SpO2 92 %.  PHYSICAL EXAMINATION:  GENERAL:  75 y.o.-year-old patient lying in the bed with no acute distress.  EYES: Pupils equal, round, reactive to light and accommodation. No scleral icterus. Extraocular muscles intact.  HEENT: Head atraumatic, normocephalic. Oropharynx and nasopharynx clear.  NECK:  Supple, no jugular venous distention. No thyroid enlargement, no tenderness.  LUNGS: Normal breath sounds bilaterally, no wheezing, rales,rhonchi or crepitation. No use of accessory  muscles of respiration.  CARDIOVASCULAR: S1, S2 normal. No murmurs, rubs, or gallops.  ABDOMEN: Soft, nontender, nondistended. Bowel sounds present. No organomegaly or mass.  EXTREMITIES: right knee swollen,sdecreased range of motion in hip and knee,  NEUROLOGIC: Cranial nerves II through XII are intact. Muscle strength 5/5 in all extremities. Sensation intact. Gait not checked.  PSYCHIATRIC: The patient is alert and oriented x 3.  SKIN: No obvious rash, lesion, or ulcer.    LABORATORY PANEL:   CBC  Recent Labs Lab 12/18/15 0504  WBC 17.4*  HGB 8.9*  HCT 27.9*  PLT 331   ------------------------------------------------------------------------------------------------------------------  Chemistries   Recent Labs Lab 12/17/15 2109 12/18/15 0504  NA 138 141  K 3.9 3.7  CL 103 110  CO2 21* 20*  GLUCOSE 158* 135*  BUN 77* 71*  CREATININE 3.55* 3.23*  CALCIUM 9.6 8.8*  AST 96*  --   ALT 40  --   ALKPHOS 75  --   BILITOT 1.1  --    ------------------------------------------------------------------------------------------------------------------  Cardiac Enzymes No results for input(s): TROPONINI in the last 168 hours. ------------------------------------------------------------------------------------------------------------------  RADIOLOGY:  Dg Ankle 2 Views Left  12/17/2015  CLINICAL DATA:  Left ankle pain/ swelling, history of gout EXAM: LEFT ANKLE - 2 VIEW COMPARISON:  06/18/2011 FINDINGS: No fracture or dislocation is seen. Mild tibiotalar degenerative changes along the medial ankle mortise. The base of the fifth metatarsal is unremarkable. Mild medial soft tissue swelling. Small posterior calcaneal enthesophyte. IMPRESSION: No fracture or dislocation is seen. Mild degenerative changes along the medial tibiotalar joint. Overlying soft tissue swelling. Electronically Signed   By: Julian Hy M.D.   On: 12/17/2015 22:43   Ct Head Wo Contrast  12/17/2015   CLINICAL  DATA:  Acute onset of altered mental status. Black tarry stools. Tiredness. Initial encounter. EXAM: CT HEAD WITHOUT CONTRAST TECHNIQUE: Contiguous axial images were obtained from the base of the skull through the vertex without intravenous contrast. COMPARISON:  CT of the head performed 11/09/2007 FINDINGS: There is no evidence of acute infarction, mass lesion, or intra- or extra-axial hemorrhage on CT. Mild periventricular white matter change likely reflects small vessel ischemic microangiopathy. A chronic lacunar infarct is noted at the right anterior corona radiata. The posterior fossa, including the cerebellum, brainstem and fourth ventricle, is within normal limits. The third and lateral ventricles, and basal ganglia are unremarkable in appearance. The cerebral hemispheres demonstrate grossly gray-white differentiation. No mass effect or midline shift is seen. There is no evidence of fracture; visualized osseous structures are unremarkable in appearance. The orbits are within normal limits. The paranasal sinuses and mastoid air cells are well-aerated. No significant soft tissue abnormalities are seen. IMPRESSION: 1. No acute intracranial pathology seen on CT. 2. Mild small vessel ischemic microangiopathy. 3. 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 Electronically Signed   By: Garald Balding M.D.   On: 12/17/2015 23:29   Dg Chest Port 1 View  12/17/2015  CLINICAL DATA:  Fever, weakness, black stools EXAM: PORTABLE CHEST 1 VIEW COMPARISON:  08/06/2012 FINDINGS: Patient is rotated. Lungs are clear.  No pleural effusion or pneumothorax. Mild cardiomegaly. IMPRESSION: No evidence of acute cardiopulmonary disease. Electronically Signed   By: Julian Hy M.D.   On: 12/17/2015 22:39    EKG:   Orders placed or performed during the hospital encounter of 12/17/15  . ED EKG  . ED EKG  . EKG 12-Lead  . EKG 12-Lead  . ED EKG  . ED EKG    ASSESSMENT AND PLAN:  1.Sepsis  due to FTD:[id number] with IV abx.iv fluids;follow urine cultures.  2.Metabolic encephalopathy due to sepsis;improving 3.acute on chronic renal failure;improving with IV  Hydration 4.right knee swelling,right  Hip pain and ROM is limited;check xray right hip and knee to evaluate for fracture in hip or knee.had a fall from bed . 5.full code   All the records are reviewed and case discussed with Care Management/Social Workerr. Management plans discussed with the patient, family and they are in agreement.  CODE STATUS:  full  TOTAL TIME TAKING CARE OF THIS PATIENT:  35 minutes.   POSSIBLE D/C IN 2-3DAYS, DEPENDING ON CLINICAL CONDITION.   Epifanio Lesches M.D on 12/18/2015 at 6:00 PM  Between 7am to 6pm - Pager - [phone]  After 6pm go to [email or web address] - password [email or web address] [place] [facility]  Office  [phone]  CC: Primary care physician; Madelyn Brunner, MD   Note: This dictation was prepared with Dragon dictation along with smaller phrase technology. Any transcriptional errors that result from this process are unintentional.

## 2015-12-18 NOTE — Progress Notes (Signed)
Pharmacy Antibiotic Note  Barbara [name] is a 75 y.o. female admitted on 12/17/2015 with UTI.  Pharmacy has been consulted for ceftriaxone dosing.  Plan: Ceftriaxone 2 grams q 24 hours ordered.  Weight: 201 lb 11.5 oz (91.5 kg)  Temp (24hrs), Avg:101.6 F (38.7 C), Min:100.3 F (37.9 C), Max:102.9 F (39.4 C)   Recent Labs Lab 12/17/15 2109 12/17/15 2110 12/17/15 2300  WBC  --   --  19.9*  CREATININE 3.55*  --   --   LATICACIDVEN  --  1.4  --     CrCl cannot be calculated (Unknown ideal weight.).    Allergies  Allergen Reactions  . Percocet [Oxycodone-Acetaminophen] Hives    Antimicrobials this admission: cftriaxone  >>    >>   Dose adjustments this admission:   Microbiology results: 5/30 BCx: pending 5/30 UCx: pending    5/30 UA: LE(+) NO2(-) WBC TNTC 5/30 CXR: no acute disease  Thank you for allowing pharmacy to be a part of this patient's care.  Maiana Hennigan S 12/18/2015 12:50 AM

## 2015-12-18 NOTE — Care Management Important Message (Signed)
Important Message  Patient Details  Name: Barbara [name] MRN: [id number] Date of Birth: 1941-06-28   Medicare Important Message Given:  Yes    Shelbie Ammons, RN 12/18/2015, 8:36 AM

## 2015-12-18 NOTE — H&P (Signed)
[facility] [place] NAME: Barbara [name]    MR#:  [id number]  DATE OF BIRTH:  1941/03/21  DATE OF ADMISSION:  12/17/2015  PRIMARY CARE PHYSICIAN: Madelyn Brunner, MD   REQUESTING/REFERRING PHYSICIAN:   CHIEF COMPLAINT:   Chief Complaint  Patient presents with  . Altered Mental Status  . Rectal Bleeding    HISTORY OF PRESENT ILLNESS: Barbara [name]  is a 75 y.o. female with a known history of COPD, hypertension, diabetes mellitus, arthritis, gout presented to the emergency room with lethargy and poor oral intake. Initially patient was accompanied by granddaughter and family. They gave the history to the ER physician. Patient has not been eating well and drinking enough fluids for the last few days. Patient also was lethargic and confused for the last 1 day. Family also reported some dark tarry stool. Patient is unable to give much history. She has pain in the left ankle as well as in the hip area. No history of any fall or head injury according to the family. Patient was low below was found to be having urinary tract infection also was found to be in sepsis. IV fluid resuscitation was started and patient received IV antibiotics.  PAST MEDICAL HISTORY:   Past Medical History  Diagnosis Date  . COPD (chronic obstructive pulmonary disease) ([place])   . Hypertension   . Diabetes mellitus without complication ([place])   . Arthritis     PAST SURGICAL HISTORY: Past Surgical History  Procedure Laterality Date  . Abdominal hysterectomy    . Rotator cuff repair      left    SOCIAL HISTORY:  Social History  Substance Use Topics  . Smoking status: Former Research scientist (life sciences)  . Smokeless tobacco: Not on file  . Alcohol Use: No    FAMILY HISTORY:  Family History  Problem Relation Age of Onset  . Breast cancer Neg Hx     DRUG ALLERGIES:  Allergies  Allergen Reactions  . Percocet [Oxycodone-Acetaminophen] Hives    REVIEW OF SYSTEMS:  Could not be  obtained as patient is lethargic and confused. MEDICATIONS AT HOME:  Prior to Admission medications   Medication Sig Start Date End Date Taking? Authorizing Provider  HYDROmorphone (DILAUDID) 2 MG tablet Take 1 tablet (2 mg total) by mouth every 12 (twelve) hours as needed for severe pain. 04/16/15   Daymon Larsen, MD      PHYSICAL EXAMINATION:   VITAL SIGNS: Blood pressure 131/62, pulse 94, temperature 102.9 F (39.4 C), temperature source Rectal, resp. rate 20, weight 91.5 kg (201 lb 11.5 oz), SpO2 95 %.  GENERAL:  75 y.o.-year-old patient lying in the bed in mild distress.  EYES: Pupils equal, round, reactive to light and accommodation. No scleral icterus. Extraocular muscles intact.  HEENT: Head atraumatic, normocephalic. Oropharynx dry and nasopharynx clear.  NECK:  Supple, no jugular venous distention. No thyroid enlargement, no tenderness.  LUNGS: Normal breath sounds bilaterally, no wheezing, rales,rhonchi or crepitation. No use of accessory muscles of respiration.  CARDIOVASCULAR: S1, S2 normal. No murmurs, rubs, or gallops.  ABDOMEN: Soft, nontender, nondistended. Bowel sounds present. No organomegaly or mass.  EXTREMITIES: No pedal edema, cyanosis, or clubbing. Tenderness left ankle. NEUROLOGIC: Arousable to painful stimuli and loud verbal commands.Not completely oriented to time,place and person. PSYCHIATRIC: could not be assessed  SKIN: No obvious rash, lesion, or ulcer.   LABORATORY PANEL:   CBC  Recent Labs Lab 12/17/15 2300  WBC 19.9*  HGB 9.4*  HCT 28.0*  PLT 343  MCV 84.3  MCH 28.3  MCHC 33.6  RDW 14.6*   ------------------------------------------------------------------------------------------------------------------  Chemistries   Recent Labs Lab 12/17/15 2109  NA 138  K 3.9  CL 103  CO2 21*  GLUCOSE 158*  BUN 77*  CREATININE 3.55*  CALCIUM 9.6  AST 96*  ALT 40  ALKPHOS 75  BILITOT 1.1    ------------------------------------------------------------------------------------------------------------------ CrCl cannot be calculated (Unknown ideal weight.). ------------------------------------------------------------------------------------------------------------------ No results for input(s): TSH, T4TOTAL, T3FREE, THYROIDAB in the last 72 hours.  Invalid input(s): FREET3   Coagulation profile No results for input(s): INR, PROTIME in the last 168 hours. ------------------------------------------------------------------------------------------------------------------- No results for input(s): DDIMER in the last 72 hours. -------------------------------------------------------------------------------------------------------------------  Cardiac Enzymes No results for input(s): CKMB, TROPONINI, MYOGLOBIN in the last 168 hours.  Invalid input(s): CK ------------------------------------------------------------------------------------------------------------------ Invalid input(s): POCBNP  ---------------------------------------------------------------------------------------------------------------  Urinalysis    Component Value Date/Time   COLORURINE YELLOW* 12/17/2015 2110   COLORURINE Straw 08/02/2012 1114   APPEARANCEUR TURBID* 12/17/2015 2110   APPEARANCEUR Clear 08/02/2012 1114   LABSPEC 1.012 12/17/2015 2110   LABSPEC 1.012 08/02/2012 1114   PHURINE 5.0 12/17/2015 2110   PHURINE 5.0 08/02/2012 1114   GLUCOSEU NEGATIVE 12/17/2015 2110   GLUCOSEU Negative 08/02/2012 1114   HGBUR 3+* 12/17/2015 2110   HGBUR 1+ 08/02/2012 1114   BILIRUBINUR NEGATIVE 12/17/2015 2110   BILIRUBINUR Negative 08/02/2012 [place] 12/17/2015 2110   KETONESUR Negative 08/02/2012 1114   PROTEINUR 100* 12/17/2015 2110   PROTEINUR Negative 08/02/2012 1114   NITRITE NEGATIVE 12/17/2015 2110   NITRITE Negative 08/02/2012 1114   LEUKOCYTESUR 3+* 12/17/2015 2110    LEUKOCYTESUR Negative 08/02/2012 1114     RADIOLOGY: Dg Ankle 2 Views Left  12/17/2015  CLINICAL DATA:  Left ankle pain/ swelling, history of gout EXAM: LEFT ANKLE - 2 VIEW COMPARISON:  06/18/2011 FINDINGS: No fracture or dislocation is seen. Mild tibiotalar degenerative changes along the medial ankle mortise. The base of the fifth metatarsal is unremarkable. Mild medial soft tissue swelling. Small posterior calcaneal enthesophyte. IMPRESSION: No fracture or dislocation is seen. Mild degenerative changes along the medial tibiotalar joint. Overlying soft tissue swelling. Electronically Signed   By: Julian Hy M.D.   On: 12/17/2015 22:43   Ct Head Wo Contrast  12/17/2015  CLINICAL DATA:  Acute onset of altered mental status. Black tarry stools. Tiredness. Initial encounter. EXAM: CT HEAD WITHOUT CONTRAST TECHNIQUE: Contiguous axial images were obtained from the base of the skull through the vertex without intravenous contrast. COMPARISON:  CT of the head performed 11/09/2007 FINDINGS: There is no evidence of acute infarction, mass lesion, or intra- or extra-axial hemorrhage on CT. Mild periventricular white matter change likely reflects small vessel ischemic microangiopathy. A chronic lacunar infarct is noted at the right anterior corona radiata. The posterior fossa, including the cerebellum, brainstem and fourth ventricle, is within normal limits. The third and lateral ventricles, and basal ganglia are unremarkable in appearance. The cerebral hemispheres demonstrate grossly gray-white differentiation. No mass effect or midline shift is seen. There is no evidence of fracture; visualized osseous structures are unremarkable in appearance. The orbits are within normal limits. The paranasal sinuses and mastoid air cells are well-aerated. No significant soft tissue abnormalities are seen. IMPRESSION: 1. No acute intracranial pathology seen on CT. 2. Mild small vessel ischemic microangiopathy. 3. 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 Electronically Signed   By: Garald Balding M.D.   On: 12/17/2015 23:29  Dg Chest Port 1 View  12/17/2015  CLINICAL DATA:  Fever, weakness, black stools EXAM: PORTABLE CHEST 1 VIEW COMPARISON:  08/06/2012 FINDINGS: Patient is rotated. Lungs are clear.  No pleural effusion or pneumothorax. Mild cardiomegaly. IMPRESSION: No evidence of acute cardiopulmonary disease. Electronically Signed   By: Julian Hy M.D.   On: 12/17/2015 22:39    EKG: Orders placed or performed during the hospital encounter of 12/17/15  . ED EKG  . ED EKG  . EKG 12-Lead  . EKG 12-Lead  . ED EKG  . ED EKG    IMPRESSION AND PLAN: 75 year old female patient with history of COPD, diabetes mellitus, hypertension, gout, arthritis presented to the emergency room with lethargy and confusion and fever. Admitting diagnosis 1. Altered mental status secondary to urinary tract infection 2. Urinary tract infection 3. Sepsis 4. Acute kidney injury 5. Leukocytosis secondary to urinary tract infection 6. Anemia which appears to be chronic 7.Left ankle pain secondary to arthritis. Treatment plan Admit patient to medical floor with cardiac monitoring IV fluid resuscitation IV Rocephin antibiotic 1 g daily Follow-up renal function DVT prophylaxis with sequential compression device to lower extremity Follow-up hemoglobin and hematocrit Follow-up cultures Supportive care   All the records are reviewed and case discussed with ED provider. Management plans discussed with the patient, family and they are in agreement.  CODE STATUS:FULL Code Status History    This patient does not have a recorded code status. Please follow your organizational policy for patients in this situation.       TOTAL TIME TAKING CARE OF THIS PATIENT:55 minutes.    Saundra Shelling M.D on 12/18/2015 at 12:35 AM  Between 7am to 6pm - Pager - [phone]  After 6pm go to [email or web address] -  password [email or web address] [facility]  Office  [phone]  CC: Primary care physician; Madelyn Brunner, MD

## 2015-12-18 NOTE — Progress Notes (Signed)
Initial Nutrition Assessment  DOCUMENTATION CODES:   Obesity unspecified  INTERVENTION:   Cater to pt preferences on Heart Healthy Diet order. Will recommend addition of Carb Modified diet order if intake improved as pt with h/o DM. Will recommend supplement on follow if intake inadequate.   NUTRITION DIAGNOSIS:   Inadequate oral intake related to acute illness as evidenced by per patient/family report.  GOAL:   Patient will meet greater than or equal to 90% of their needs  MONITOR:   PO intake, Supplement acceptance, Weight trends, Labs, I & O's  REASON FOR ASSESSMENT:   Malnutrition Screening Tool    ASSESSMENT:   Pt admitted with AMS secondary to UTI with tarry stools and poor po intake PTA.   Past Medical History  Diagnosis Date  . COPD (chronic obstructive pulmonary disease) ([place])   . Hypertension   . Diabetes mellitus without complication ([place])   . Arthritis      Diet Order:  Diet Heart Room service appropriate?: Yes; Fluid consistency:: Thin   Pt reports eating about half of her breakfast. Pt asking for something cold to drink on visit.  Pt reports eating very little since this past Saturday. Previous to Saturday pt reports having a very healthy appetite.  Medications: SS novolog, NS at 16m/hr Labs: BUN 77, Cre 3.23, Glucose 135, 158   Gastrointestinal Profile: Last BM:  12/18/2015   Nutrition-Focused Physical Exam Findings: Nutrition-Focused physical exam completed. Findings are WDL for fat depletion, muscle depletion, and edema.    Weight Change: Pt reports thinking she usually weighs around 220lbs. Unsure of weight currently or trend. Per [facility] weight of 227lbs September 2016 (7% weight loss in 8 months)   Skin:  Reviewed, no issues   Height:   Ht Readings from Last 1 Encounters:  12/18/15 5' 6"  (1.676 m)    Weight:   Wt Readings from Last 1 Encounters:  12/18/15 210 lb 9.6 oz (95.528 kg)   Wt Readings from Last 10 Encounters:   12/18/15 210 lb 9.6 oz (95.528 kg)  04/16/15 227 lb 6.4 oz (103.148 kg)  03/14/15 228 lb (103.42 kg)    Ideal Body Weight:   59kg  BMI:  Body mass index is 34.01 kg/(m^2).  Estimated Nutritional Needs:   Kcal:  1770-2065kcals  Protein:  70-88g protein  Fluid:  1.8-2.0L fluid  EDUCATION NEEDS:   Education needs no appropriate at this time  ADwyane Luo RD, LDN Pager [phone]/On-Call Pager [phone]

## 2015-12-18 NOTE — Care Management (Addendum)
Admitted to [facility] with the diagnosis of sepsis. Lives with husband, Jeneen Rinks. [phone]). Last seen Dr. Gilford Rile 2 weeks ago. No Life Alert. No home Health. No skilled facility. Home oxygen x 6 years through [place]. 2 liters at night. Poor po intake since last Saturday. No falls. No equipment to aid in ambulation per Ms. [place]. States her husband id there with her 24/7. States she has an home health aide that "comes in when she wants to." "She suppose to come in every day and help me with my bath and dressing." She comes from [facility]. States that she is having problems with her left hand. Prescriptions through [facility] in [place]. Husband will transport. Shelbie Ammons RN MSN CCM Care Management [phone]

## 2015-12-19 LAB — BASIC METABOLIC PANEL
ANION GAP: 11 (ref 5–15)
BUN: 58 mg/dL — ABNORMAL HIGH (ref 6–20)
CO2: 18 mmol/L — ABNORMAL LOW (ref 22–32)
Calcium: 8.6 mg/dL — ABNORMAL LOW (ref 8.9–10.3)
Chloride: 112 mmol/L — ABNORMAL HIGH (ref 101–111)
Creatinine, Ser: 2.64 mg/dL — ABNORMAL HIGH (ref 0.44–1.00)
GFR, EST AFRICAN AMERICAN: 19 mL/min — AB (ref >60)
GFR, EST NON AFRICAN AMERICAN: 16 mL/min — AB (ref >60)
Glucose, Bld: 128 mg/dL — ABNORMAL HIGH (ref 65–99)
POTASSIUM: 4.1 mmol/L (ref 3.5–5.1)
SODIUM: 141 mmol/L (ref 135–145)

## 2015-12-19 LAB — GLUCOSE, CAPILLARY
GLUCOSE-CAPILLARY: 99 mg/dL (ref 65–99)
GLUCOSE-CAPILLARY: 127 mg/dL — AB (ref 65–99)
GLUCOSE-CAPILLARY: 121 mg/dL — AB (ref 65–99)
Glucose-Capillary: 104 mg/dL — ABNORMAL HIGH (ref 65–99)
Glucose-Capillary: 123 mg/dL — ABNORMAL HIGH (ref 65–99)

## 2015-12-19 LAB — BLOOD GAS, VENOUS
Acid-base deficit: 2.3 mmol/L — ABNORMAL HIGH (ref 0.0–2.0)
Bicarbonate: 22.5 mEq/L (ref 21.0–28.0)
FIO2: 0.28
PATIENT TEMPERATURE: 37
PCO2 VEN: 38 mmHg — AB (ref 44.0–60.0)
PH VEN: 7.38 (ref 7.320–7.430)

## 2015-12-19 LAB — CBC
HCT: 25 % — ABNORMAL LOW (ref 35.0–47.0)
Hemoglobin: 8.1 g/dL — ABNORMAL LOW (ref 12.0–16.0)
MCH: 27.6 pg (ref 26.0–34.0)
MCHC: 32.4 g/dL (ref 32.0–36.0)
MCV: 85.1 fL (ref 80.0–100.0)
PLATELETS: 303 10*3/uL (ref 150–440)
RBC: 2.94 MIL/uL — AB (ref 3.80–5.20)
RDW: 14.5 % (ref 11.5–14.5)
WBC: 14 10*3/uL — AB (ref 3.6–11.0)

## 2015-12-19 MED ORDER — SODIUM BICARBONATE 650 MG PO TABS
650.0000 mg | ORAL_TABLET | Freq: Two times a day (BID) | ORAL | Status: DC
  Administered 2015-12-19 – 2015-12-23 (×8): 650 mg via ORAL
  Filled 2015-12-19 (×9): qty 1

## 2015-12-19 NOTE — Clinical Documentation Improvement (Signed)
Internal Medicine at [facility]  Please document query responses in the progress notes and discharge summary, not on the CDI BPA from in [facility]. Please do not deactivate queries without responding to them. Thank you!  Please document if a condition below provides greater specificity regarding the patient's "chronic renal failure::  - Stage 4 CKD  (chronic kidney disease)  - Other condition  - Unable to clinically determine  Clinical Information: "acute on chronic renal failure;improving with IV Hydration" is documented in the progress note dated 12/18/15 BUN/Cr/GFR trend this admission   (black female) Component     Latest Ref Rng 12/17/2015 12/18/2015 12/19/2015  BUN     6 - 20 mg/dL 77 (H) 71 (H) 58 (H)  Creatinine     0.44 - 1.00 mg/dL 3.55 (H) 3.23 (H) 2.64 (H)  EGFR (African American)     >60 mL/min 13 (L) 15 (L) 19 (L)     Please exercise your independent, professional judgment when responding. A specific answer is not anticipated or expected.   Thank You, Erling Conte  RN BSN CCDS [phone] Health Information Management [place]

## 2015-12-19 NOTE — Progress Notes (Signed)
[place] [facility] [place] NAME: Barbara [name]    MR#:  [id number]  DATE OF BIRTH:  12/31/1940  SUBJECTIVE:admitted for fever/confusion,/rectal bleeding.no rectal bleeding but found to have UTI.has poor po intake,  CHIEF COMPLAINT:   Chief Complaint  Patient presents with  . Altered Mental Status  . Rectal Bleeding    REVIEW OF SYSTEMS:   ROS CONSTITUTIONAL:c/o leg pain. EYES: No blurred or double vision.  EARS, NOSE, AND THROAT: No tinnitus or ear pain.  RESPIRATORY: No cough, shortness of breath, wheezing or hemoptysis.  CARDIOVASCULAR: No chest pain, orthopnea, edema.  GASTROINTESTINAL: No nausea, vomiting, diarrhea or abdominal pain.  GENITOURINARY: No dysuria, hematuria.  ENDOCRINE: No polyuria, nocturia,  HEMATOLOGY: No anemia, easy bruising or bleeding SKIN: No rash or lesion. MUSCULOSKELETAL:  Right knee effusion. Lifting the right leg causing pain.right knee effusion. present NEUROLOGIC: No tingling, numbness, weakness.  PSYCHIATRY: No anxiety or depression.   DRUG ALLERGIES:   Allergies  Allergen Reactions  . Percocet [Oxycodone-Acetaminophen] Hives    VITALS:  Blood pressure 155/60, pulse 86, temperature 98.6 F (37 C), temperature source Oral, resp. rate 24, height 5' 6"  (1.676 m), weight 95.528 kg (210 lb 9.6 oz), SpO2 90 %.  PHYSICAL EXAMINATION:  GENERAL:  75 y.o.-year-old patient lying in the bed with no acute distress.  EYES: Pupils equal, round, reactive to light and accommodation. No scleral icterus. Extraocular muscles intact.  HEENT: Head atraumatic, normocephalic. Oropharynx and nasopharynx clear.  NECK:  Supple, no jugular venous distention. No thyroid enlargement, no tenderness.  LUNGS: Normal breath sounds bilaterally, no wheezing, rales,rhonchi or crepitation. No use of accessory muscles of respiration.  CARDIOVASCULAR: S1, S2 normal. No murmurs, rubs, or gallops.  ABDOMEN: Soft, nontender,  nondistended. Bowel sounds present. No organomegaly or mass.  EXTREMITIES: right knee swollen,sdecreased range of motion in hip and knee,  NEUROLOGIC: Cranial nerves II through XII are intact. Muscle strength 5/5 in all extremities. Sensation intact. Gait not checked.  PSYCHIATRIC: The patient is alert and oriented x 3.  SKIN: No obvious rash, lesion, or ulcer.    LABORATORY PANEL:   CBC  Recent Labs Lab 12/19/15 1111  WBC 14.0*  HGB 8.1*  HCT 25.0*  PLT 303   ------------------------------------------------------------------------------------------------------------------  Chemistries   Recent Labs Lab 12/17/15 2109  12/19/15 1111  NA 138  < > 141  K 3.9  < > 4.1  CL 103  < > 112*  CO2 21*  < > 18*  GLUCOSE 158*  < > 128*  BUN 77*  < > 58*  CREATININE 3.55*  < > 2.64*  CALCIUM 9.6  < > 8.6*  AST 96*  --   --   ALT 40  --   --   ALKPHOS 75  --   --   BILITOT 1.1  --   --   < > = values in this interval not displayed. ------------------------------------------------------------------------------------------------------------------  Cardiac Enzymes No results for input(s): TROPONINI in the last 168 hours. ------------------------------------------------------------------------------------------------------------------  RADIOLOGY:  Dg Knee 1-2 Views Right  12/18/2015  CLINICAL DATA:  Patient has had increased right leg pain post admittance. Patient has a HX of gout. EXAM: RIGHT KNEE - 1-2 VIEW COMPARISON:  None. FINDINGS: No fracture or dislocation. Mild joint space narrowing. Moderate joint effusion. Mild atherosclerotic calcification of the femoral artery. Mild arthritic change in the patellofemoral compartment. IMPRESSION: Mild arthritic change.  Moderate joint effusion. Electronically Signed   By: Elodia Florence.D.  On: 12/18/2015 18:59   Dg Ankle 2 Views Left  12/17/2015  CLINICAL DATA:  Left ankle pain/ swelling, history of gout EXAM: LEFT ANKLE - 2 VIEW  COMPARISON:  06/18/2011 FINDINGS: No fracture or dislocation is seen. Mild tibiotalar degenerative changes along the medial ankle mortise. The base of the fifth metatarsal is unremarkable. Mild medial soft tissue swelling. Small posterior calcaneal enthesophyte. IMPRESSION: No fracture or dislocation is seen. Mild degenerative changes along the medial tibiotalar joint. Overlying soft tissue swelling. Electronically Signed   By: Julian Hy M.D.   On: 12/17/2015 22:43   Ct Head Wo Contrast  12/17/2015  CLINICAL DATA:  Acute onset of altered mental status. Black tarry stools. Tiredness. Initial encounter. EXAM: CT HEAD WITHOUT CONTRAST TECHNIQUE: Contiguous axial images were obtained from the base of the skull through the vertex without intravenous contrast. COMPARISON:  CT of the head performed 11/09/2007 FINDINGS: There is no evidence of acute infarction, mass lesion, or intra- or extra-axial hemorrhage on CT. Mild periventricular white matter change likely reflects small vessel ischemic microangiopathy. A chronic lacunar infarct is noted at the right anterior corona radiata. The posterior fossa, including the cerebellum, brainstem and fourth ventricle, is within normal limits. The third and lateral ventricles, and basal ganglia are unremarkable in appearance. The cerebral hemispheres demonstrate grossly gray-white differentiation. No mass effect or midline shift is seen. There is no evidence of fracture; visualized osseous structures are unremarkable in appearance. The orbits are within normal limits. The paranasal sinuses and mastoid air cells are well-aerated. No significant soft tissue abnormalities are seen. IMPRESSION: 1. No acute intracranial pathology seen on CT. 2. Mild small vessel ischemic microangiopathy. 3. 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 Electronically Signed   By: Garald Balding M.D.   On: 12/17/2015 23:29   Dg Chest Port 1 View  12/17/2015  CLINICAL DATA:   Fever, weakness, black stools EXAM: PORTABLE CHEST 1 VIEW COMPARISON:  08/06/2012 FINDINGS: Patient is rotated. Lungs are clear.  No pleural effusion or pneumothorax. Mild cardiomegaly. IMPRESSION: No evidence of acute cardiopulmonary disease. Electronically Signed   By: Julian Hy M.D.   On: 12/17/2015 22:39   Dg Hip Unilat With Pelvis 2-3 Views Right  12/18/2015  CLINICAL DATA:  Right hip pain.  No reported injury.  Gout. EXAM: DG HIP (WITH OR WITHOUT PELVIS) 2-3V RIGHT COMPARISON:  10/03/2014 CT abdomen/pelvis. FINDINGS: No pelvic fracture or diastasis. No right hip fracture or dislocation. 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 No suspicious focal osseous lesions. No appreciable joint erosions in the hips. 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 Electronically Signed   By: Ilona Sorrel M.D.   On: 12/18/2015 18:57    EKG:   Orders placed or performed during the hospital encounter of 12/17/15  . ED EKG  . ED EKG  . EKG 12-Lead  . EKG 12-Lead  . ED EKG  . ED EKG    ASSESSMENT AND PLAN:  1.Sepsis due to KLK:[id number] with IV abx.iv fluids; Urine cultures shows gram negative rods, Waiting for full S to results,   2.Metabolic encephalopathy due to sepsis;improving 3.acute on chronic renal failure;improving with IV  Hydration ckd stage 4; 4.right knee swelling,right  Hip pain and ROM is limited;c Xray showing degenerative joint disease.  PT Recommend SNF.       5.full code   All the records are reviewed and case discussed with Care Management/Social Workerr. Management plans discussed with the  patient, family and they are in agreement.  CODE STATUS:  full  TOTAL TIME TAKING CARE OF THIS PATIENT:  35 minutes.   POSSIBLE D/C IN 2-3DAYS, DEPENDING ON CLINICAL CONDITION.   Epifanio Lesches M.D on 12/19/2015 at 4:18 PM  Between 7am to 6pm - Pager - [phone]  After 6pm go to [email or web address] - password  EPAS [place] [facility]  Office  [phone]  CC: Primary care physician; Madelyn Brunner, MD   Note: This dictation was prepared with Dragon dictation along with smaller phrase technology. Any transcriptional errors that result from this process are unintentional.

## 2015-12-19 NOTE — NC FL2 (Signed)
[place] LEVEL OF CARE SCREENING TOOL     IDENTIFICATION  Patient Name: Barbara [name] Birthdate: Nov 21, 1940 Sex: female Admission Date (Current Location): 12/17/2015  [facility] [place] Number:  Barbara [name]  ([id number]) Facility and Address:  [facility], [address] [place], [place] [address]      Provider Number: [id number]  Attending Physician Name and Address:  Epifanio Lesches, MD  Relative Name and Phone Number:       Current Level of Care: Hospital Recommended Level of Care: [place] Prior Approval Number:    Date Approved/Denied:   PASRR Number:  [id number])  Discharge Plan: SNF    Current Diagnoses: Patient Active Problem List   Diagnosis Date Noted  . Sepsis ([place]) 12/18/2015  . UTI (lower urinary tract infection) 12/18/2015    Orientation RESPIRATION BLADDER Height & Weight     Self, Time, Situation, Place  O2 (Nasal Cannula 2 (L/min) ) Continent Weight: 210 lb 9.6 oz (95.528 kg) Height:  5' 6"  (167.6 cm)  BEHAVIORAL SYMPTOMS/MOOD NEUROLOGICAL BOWEL NUTRITION STATUS   (None)  (None) Continent Diet (Heart)  AMBULATORY STATUS COMMUNICATION OF NEEDS Skin   Extensive Assist Verbally Normal                       Personal Care Assistance Level of Assistance  Feeding, Bathing, Dressing Bathing Assistance: Limited assistance Feeding assistance: Independent Dressing Assistance: Limited assistance     Functional Limitations Info  Sight, Hearing, Speech Sight Info: Adequate Hearing Info: Adequate Speech Info: Adequate    SPECIAL CARE FACTORS FREQUENCY  PT (By licensed PT)     PT Frequency:  (5)              Contractures      Additional Factors Info  Code Status, Allergies, Insulin Sliding Scale Code Status Info:  (Full Code) Allergies Info:  (Percocet)   Insulin Sliding Scale Info:  ( insulin aspart (novoLOG) injection 0-5 Units 0-5 Units, Subcutaneous, Daily at bedtime   &insulin aspart (novoLOG) injection 0-20 Units 0-20 Units, Subcutaneous, 3 times daily with meals)       Current Medications (12/19/2015):  This is the current hospital active medication list Current Facility-Administered Medications  Medication Dose Route Frequency Provider Last Rate Last Dose  . 0.9 %  sodium chloride infusion   Intravenous Continuous Epifanio Lesches, MD 50 mL/hr at 12/19/15 1440    . acetaminophen (TYLENOL) tablet 650 mg  650 mg Oral Q6H PRN Saundra Shelling, MD   650 mg at 12/19/15 1017   Or  . acetaminophen (TYLENOL) suppository 650 mg  650 mg Rectal Q6H PRN Saundra Shelling, MD      . cefTRIAXone (ROCEPHIN) 2 g in dextrose 5 % 50 mL IVPB  2 g Intravenous Q24H Saundra Shelling, MD   2 g at 12/18/15 1827  . insulin aspart (novoLOG) injection 0-20 Units  0-20 Units Subcutaneous TID WC Saundra Shelling, MD   3 Units at 12/19/15 1215  . insulin aspart (novoLOG) injection 0-5 Units  0-5 Units Subcutaneous QHS Saundra Shelling, MD   0 Units at 12/18/15 0132  . ondansetron (ZOFRAN) tablet 4 mg  4 mg Oral Q6H PRN Saundra Shelling, MD       Or  . ondansetron (ZOFRAN) injection 4 mg  4 mg Intravenous Q6H PRN Pavan Pyreddy, MD      . senna-docusate (Senokot-S) tablet 1 tablet  1 tablet Oral QHS PRN Saundra Shelling, MD      .  sodium chloride flush (NS) 0.9 % injection 3 mL  3 mL Intravenous Q12H Saundra Shelling, MD   3 mL at 12/18/15 0226     Discharge Medications: Please see discharge summary for a list of discharge medications.  Relevant Imaging Results:  Relevant Lab Results:   Additional Information  (SSN [id number])  Lorenso Quarry Lavora Brisbon, LCSW

## 2015-12-19 NOTE — Evaluation (Signed)
Physical Therapy Evaluation Patient Details Name: Barbara [name] MRN: [id number] DOB: Nov 23, 1940 Today's Date: 12/19/2015   History of Present Illness  75 yo F presented to ED on 5/30 for black tarry stool, foul urine and feeling "tire" with AMS. She was found to have sepsis. PMH includes COPD, DM.   Clinical Impression  Pt demonstrated generalized weakness, difficulty with mobility and acute LE pain. She required mod A +1 to +2 for bed mobility. With multiple attempts for STS with max A +2 and FWW, pt was limited to ~20 seconds in standing due to weakness and feet/knee pain. STR is recommended to address deficits of strength, balance, transfers and gait to progress towards PLOF prior to returning home. Pt will benefit from skilled PT services to increase functional I and mobility for safe discharge.     Follow Up Recommendations SNF    Equipment Recommendations  None recommended by PT;Other (comment) (pt has recommended FWW)    Recommendations for Other Services       Precautions / Restrictions Precautions Precautions: Fall Restrictions Weight Bearing Restrictions: No      Mobility  Bed Mobility Overal bed mobility: Needs Assistance;+2 for physical assistance Bed Mobility: Supine to Sit;Sit to Supine     Supine to sit: Mod assist;HOB elevated Sit to supine: Mod assist;+2 for physical assistance;HOB elevated   General bed mobility comments: uses rail, cues for technique, difficulty due to fatigue and weakness  Transfers Overall transfer level: Needs assistance Equipment used: Rolling walker (2 wheeled) Transfers: Sit to/from Stand Sit to Stand: Max assist;+2 physical assistance;From elevated surface         General transfer comment: cues for hand placement and anterior weight shifting. Difficulty due to pain in feet and knees with weight bearing.  Ambulation/Gait             General Gait Details: NT due to decreased ability to stand  Stairs             Wheelchair Mobility    Modified Rankin (Stroke Patients Only)       Balance Overall balance assessment: History of Falls;Needs assistance Sitting-balance support: Bilateral upper extremity supported;Feet supported Sitting balance-Leahy Scale: Fair     Standing balance support: Bilateral upper extremity supported Standing balance-Leahy Scale: Zero Standing balance comment: limited by LE pain, flexed posture and decreased ability to stand upright                             Pertinent Vitals/Pain Pain Assessment: 0-10 Pain Score: 7  Pain Location: feet and knees with weight bearing Pain Descriptors / Indicators: Burning;Aching Pain Intervention(s): Limited activity within patient's tolerance;Monitored during session    Home Living Family/patient expects to be discharged to:: Private residence Living Arrangements: Spouse/significant other Available Help at Discharge: Family;Available 24 hours/day;Personal care attendant Type of Home: House Home Access: Stairs to enter Entrance Stairs-Rails: None Entrance Stairs-Number of Steps: 1 Home Layout: One level Home Equipment: Walker - 2 wheels;Cane - single point Additional Comments: Has a [place] aide to come a few times per week.    Prior Function Level of Independence: Needs assistance   Gait / Transfers Assistance Needed: ambulates limited distances with SPC or FWW, assistance from husband and [place] aid as needed  ADL's / Homemaking Assistance Needed: assistance from husband        Hand Dominance        Extremity/Trunk Assessment   Upper Extremity Assessment: Generalized weakness  Lower Extremity Assessment: Generalized weakness (grossly 3+/5, limited by fatigue)         Communication   Communication: No difficulties  Cognition Arousal/Alertness: Awake/alert Behavior During Therapy: WFL for tasks assessed/performed Overall Cognitive Status: Within Functional Limits for tasks assessed                       General Comments      Exercises Other Exercises Other Exercises: Multiple attempts of STS in preparation for further mobility. With max A +2 and FWW from elevated surface, pt was able to stand but was limited to a static stand of ~20 seconds at the time. Cues for postural correction, knee extension and using UEs to push on FWW to improve ability. Due to poor standing tolerance, further mobility NT      Assessment/Plan    PT Assessment Patient needs continued PT services  PT Diagnosis Difficulty walking;Generalized weakness   PT Problem List Decreased strength;Decreased activity tolerance;Decreased balance;Decreased mobility;Decreased knowledge of use of DME;Cardiopulmonary status limiting activity;Pain  PT Treatment Interventions DME instruction;Gait training;Stair training;Therapeutic activities;Therapeutic exercise;Balance training;Neuromuscular re-education;Patient/family education   PT Goals (Current goals can be found in the Care Plan section) Acute Rehab PT Goals Patient Stated Goal: to get in the chair PT Goal Formulation: With patient Time For Goal Achievement: 01/02/16 Potential to Achieve Goals: Fair    Frequency Min 2X/week   Barriers to discharge Inaccessible home environment;Decreased caregiver support steps to enter, pt currently requires +2 assistance    Co-evaluation               End of Session Equipment Utilized During Treatment: Gait belt;Oxygen Activity Tolerance: Patient limited by fatigue;Patient limited by pain Patient left: in bed;with call bell/phone within reach;with bed alarm set;with family/visitor present Nurse Communication: Mobility status;Other (comment) (incontinent episode)         Time: 8675-4492 PT Time Calculation (min) (ACUTE ONLY): 30 min   Charges:   PT Evaluation $PT Eval Moderate Complexity: 1 Procedure PT Treatments $Therapeutic Activity: 8-22 mins   PT G Codes:        Neoma Laming,  PT, DPT  12/19/2015, 10:58 AM [phone]

## 2015-12-19 NOTE — Clinical Social Work Note (Signed)
Clinical Social Work Assessment  Patient Details  Name: Barbara [name] MRN: [id number] Date of Birth: October 03, 1940  Date of referral:  12/19/15               Reason for consult:  Facility Placement                Permission sought to share information with:  Family Supports Permission granted to share information::  Yes, Verbal Permission Granted  Name::     Gravley,[name], husband   Housing/Transportation Living arrangements for the past 2 months:  Single Family Home Source of Information:  Other (Comment Required), Patient (Granddaughters) Patient Interpreter Needed:  None Criminal Activity/Legal Involvement Pertinent to Current Situation/Hospitalization:  No - Comment as needed Significant Relationships:  Adult Children, Spouse, Other Family Members Lives with:  Spouse Do you feel safe going back to the place where you live?  Yes Need for family participation in patient care:  Yes (Comment)  Care giving concerns:  No care giving concerns identified.   Social Worker assessment / plan:  CSW met with pt and granddaughters to address consult. CSW introduced herself and explained role of social work. CSW also explained the process of discharging to SNF with [facility] Medicare. PT is recommending STR at SNF. One of pt's granddaughters works at [employer] and that is the preferred facility. It will also be closer to pt's husband as he is unable to drive at night. CSW intiaited SNF search and will follow up with bed offers. CSW also called the admissions coordinator at [facility], to inquire about bed availability and requested a return phone call. CSW will continue to follow.    Employment status:  Retired Nurse, adult, Medicaid In [place] PT Recommendations:  [place] / Referral to community resources:  [place]  Patient/Family's Response to care:  Pt and family were appreciative of CSW support.   Patient/Family's Understanding of and  Emotional Response to Diagnosis, Current Treatment, and Prognosis:  Pt and pt's family feel that STR is a good option prior to returning home.   Emotional Assessment Appearance:  Appears stated age Attitude/Demeanor/Rapport:  Other (Appropriate) Affect (typically observed):  Accepting, Adaptable, Pleasant Orientation:  Oriented to Self, Oriented to Place, Oriented to  Time, Oriented to Situation Alcohol / Substance use:  Never Used Psych involvement (Current and /or in the community):  No (Comment)  Discharge Needs  Concerns to be addressed:  Adjustment to Illness Readmission within the last 30 days:  No Current discharge risk:  Chronically ill Barriers to Discharge:  Continued Medical Work up   [employer], LCSW 12/19/2015, 3:58 PM

## 2015-12-19 NOTE — Clinical Social Work Placement (Signed)
   CLINICAL SOCIAL WORK PLACEMENT  NOTE  Date:  12/19/2015  Patient Details  Name: Barbara [name] MRN: [id number] Date of Birth: 11-Aug-1940  Clinical Social Work is seeking post-discharge placement for this patient at the [place] level of care (*CSW will initial, date and re-position this form in  chart as items are completed):  Yes   Patient/family provided with [place] Work Department's list of facilities offering this level of care within the geographic area requested by the patient (or if unable, by the patient's family).  Yes   Patient/family informed of their freedom to choose among providers that offer the needed level of care, that participate in Medicare, Medicaid or managed care program needed by the patient, have an available bed and are willing to accept the patient.  Yes   Patient/family informed of [place]'s ownership interest in [facility] and [facility], as well as of the fact that they are under no obligation to receive care at these facilities.  PASRR submitted to EDS on 12/19/15     PASRR number received on 12/19/15     Existing PASRR number confirmed on       FL2 transmitted to all facilities in geographic area requested by pt/family on 12/19/15     FL2 transmitted to all facilities within larger geographic area on       Patient informed that his/her managed care company has contracts with or will negotiate with certain facilities, including the following:            Patient/family informed of bed offers received.  Patient chooses bed at       Physician recommends and patient chooses bed at      Patient to be transferred to   on  .  Patient to be transferred to facility by       Patient family notified on   of transfer.  Name of family member notified:        PHYSICIAN       Additional Comment:    _______________________________________________ Darden Dates, LCSW 12/19/2015, 4:15 PM

## 2015-12-20 LAB — GLUCOSE, CAPILLARY
Glucose-Capillary: 149 mg/dL — ABNORMAL HIGH (ref 65–99)
Glucose-Capillary: 109 mg/dL — ABNORMAL HIGH (ref 65–99)
Glucose-Capillary: 108 mg/dL — ABNORMAL HIGH (ref 65–99)
Glucose-Capillary: 114 mg/dL — ABNORMAL HIGH (ref 65–99)

## 2015-12-20 LAB — BASIC METABOLIC PANEL
ANION GAP: 10 (ref 5–15)
BUN: 48 mg/dL — ABNORMAL HIGH (ref 6–20)
CHLORIDE: 112 mmol/L — AB (ref 101–111)
CO2: 20 mmol/L — AB (ref 22–32)
Calcium: 9.3 mg/dL (ref 8.9–10.3)
Creatinine, Ser: 2.01 mg/dL — ABNORMAL HIGH (ref 0.44–1.00)
GFR calc Af Amer: 27 mL/min — ABNORMAL LOW (ref >60)
GFR calc non Af Amer: 23 mL/min — ABNORMAL LOW (ref >60)
Glucose, Bld: 133 mg/dL — ABNORMAL HIGH (ref 65–99)
POTASSIUM: 3.7 mmol/L (ref 3.5–5.1)
Sodium: 142 mmol/L (ref 135–145)

## 2015-12-20 MED ORDER — ALLOPURINOL 100 MG PO TABS
100.0000 mg | ORAL_TABLET | Freq: Every day | ORAL | Status: DC
  Administered 2015-12-20 – 2015-12-23 (×4): 100 mg via ORAL
  Filled 2015-12-20 (×4): qty 1

## 2015-12-20 MED ORDER — DEXTROSE 5 % IV SOLN
1.0000 g | INTRAVENOUS | Status: DC
  Administered 2015-12-20: 1 g via INTRAVENOUS
  Filled 2015-12-20 (×2): qty 10

## 2015-12-20 MED ORDER — PANTOPRAZOLE SODIUM 40 MG PO TBEC
40.0000 mg | DELAYED_RELEASE_TABLET | Freq: Every day | ORAL | Status: DC
  Administered 2015-12-20 – 2015-12-23 (×4): 40 mg via ORAL
  Filled 2015-12-20 (×4): qty 1

## 2015-12-20 NOTE — Progress Notes (Signed)
Pt placed on home CPAP unit. No sign of damage to machine. CPAP plugged into red outlet. Pt tolerating well.

## 2015-12-20 NOTE — Progress Notes (Signed)
[place] [facility] [place] NAME: Barbara [name]    MR#:  [id number]  DATE OF BIRTH:  1941/07/12  .  CHIEF COMPLAINT:   Chief Complaint  Patient presents with  . Altered Mental Status  . Rectal Bleeding    REVIEW OF SYSTEMS:   Review of Systems  Gastrointestinal: Positive for nausea.   CONSTITUTIONAL ; bilateral feet pain. EYES: No blurred or double vision.  EARS, NOSE, AND THROAT: No tinnitus or ear pain.  RESPIRATORY: No cough, shortness of breath, wheezing or hemoptysis.  CARDIOVASCULAR: No chest pain, orthopnea, edema.  GASTROINTESTINAL: has some  nausea, no omiting, diarrhea or abdominal pain.  GENITOURINARY: No dysuria, hematuria.  ENDOCRINE: No polyuria, nocturia,  HEMATOLOGY: No anemia, easy bruising or bleeding SKIN: No rash or lesion. MUSCULOSKELETAL:  Decreased pain on right knee and hip NEUROLOGIC: No tingling, numbness, weakness.  PSYCHIATRY: No anxiety or depression.   DRUG ALLERGIES:   Allergies  Allergen Reactions  . Percocet [Oxycodone-Acetaminophen] Hives    VITALS:  Blood pressure 158/59, pulse 73, temperature 99 F (37.2 C), temperature source Oral, resp. rate 18, height 5' 6"  (1.676 m), weight 95.528 kg (210 lb 9.6 oz), SpO2 96 %.  PHYSICAL EXAMINATION:  GENERAL:  75 y.o.-year-old patient lying in the bed with no acute distress.  EYES: Pupils equal, round, reactive to light and accommodation. No scleral icterus. Extraocular muscles intact.  HEENT: Head atraumatic, normocephalic. Oropharynx and nasopharynx clear.  NECK:  Supple, no jugular venous distention. No thyroid enlargement, no tenderness.  LUNGS: Normal breath sounds bilaterally, no wheezing, rales,rhonchi or crepitation. No use of accessory muscles of respiration.  CARDIOVASCULAR: S1, S2 normal. No murmurs, rubs, or gallops.  ABDOMEN: Soft, nontender, nondistended. Bowel sounds present. No organomegaly or mass.  EXTREMITIES: right knee  swollen,sdecreased range of motion in hip and knee,  NEUROLOGIC: Cranial nerves II through XII are intact. Muscle strength 5/5 in all extremities. Sensation intact. Gait not checked.  PSYCHIATRIC: The patient is alert and oriented x 3.  SKIN: No obvious rash, lesion, or ulcer.    LABORATORY PANEL:   CBC  Recent Labs Lab 12/19/15 1111  WBC 14.0*  HGB 8.1*  HCT 25.0*  PLT 303   ------------------------------------------------------------------------------------------------------------------  Chemistries   Recent Labs Lab 12/17/15 2109  12/19/15 1111  NA 138  < > 141  K 3.9  < > 4.1  CL 103  < > 112*  CO2 21*  < > 18*  GLUCOSE 158*  < > 128*  BUN 77*  < > 58*  CREATININE 3.55*  < > 2.64*  CALCIUM 9.6  < > 8.6*  AST 96*  --   --   ALT 40  --   --   ALKPHOS 75  --   --   BILITOT 1.1  --   --   < > = values in this interval not displayed. ------------------------------------------------------------------------------------------------------------------  Cardiac Enzymes No results for input(s): TROPONINI in the last 168 hours. ------------------------------------------------------------------------------------------------------------------  RADIOLOGY:  Dg Knee 1-2 Views Right  12/18/2015  CLINICAL DATA:  Patient has had increased right leg pain post admittance. Patient has a HX of gout. EXAM: RIGHT KNEE - 1-2 VIEW COMPARISON:  None. FINDINGS: No fracture or dislocation. Mild joint space narrowing. Moderate joint effusion. Mild atherosclerotic calcification of the femoral artery. Mild arthritic change in the patellofemoral compartment. IMPRESSION: Mild arthritic change.  Moderate joint effusion. Electronically Signed   By: Skipper Cliche M.D.   On: 12/18/2015  18:59   Dg Hip Unilat With Pelvis 2-3 Views Right  12/18/2015  CLINICAL DATA:  Right hip pain.  No reported injury.  Gout. EXAM: DG HIP (WITH OR WITHOUT PELVIS) 2-3V RIGHT COMPARISON:  10/03/2014 CT abdomen/pelvis.  FINDINGS: No pelvic fracture or diastasis. No right hip fracture or dislocation. 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 No suspicious focal osseous lesions. No appreciable joint erosions in the hips. 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 Electronically Signed   By: Ilona Sorrel M.D.   On: 12/18/2015 18:57    EKG:   Orders placed or performed during the hospital encounter of 12/17/15  . ED EKG  . ED EKG  . EKG 12-Lead  . EKG 12-Lead  . ED EKG  . ED EKG    ASSESSMENT AND PLAN:  1.Sepsis due to NVB:TYOMAYOKH with IV abx.iv fluids; Urine cultures shows gram negative rods, Waiting for full S to results,   2.Metabolic encephalopathy due to sepsis;improving 3.acute on chronic renal failure;improving with IV  Hydration,check BMP today.started bicarb tablets due to metabolic acidosis  ckd stage 4; 4.right knee swelling,right  Hip pain and ROM is limited; Xray showing degenerative joint disease.  PT Recommend SNF.       5..nausea,and  Dyspepsia;possible acute gastritis;start PPI>  All the records are reviewed and case discussed with Care Management/Social Workerr. Management plans discussed with the patient, family and they are in agreement.  CODE STATUS:  full  TOTAL TIME TAKING CARE OF THIS PATIENT:  35 minutes.   POSSIBLE D/C IN 2-3DAYS, DEPENDING ON CLINICAL CONDITION.   Epifanio Lesches M.D on 12/20/2015 at 9:35 AM  Between 7am to 6pm - Pager - [phone]  After 6pm go to [email or web address] - password [email or web address] [place] [facility]  Office  [phone]  CC: Primary care physician; Madelyn Brunner, MD   Note: This dictation was prepared with Dragon dictation along with smaller phrase technology. Any transcriptional errors that result from this process are unintentional.

## 2015-12-20 NOTE — Care Management Important Message (Signed)
Important Message  Patient Details  Name: Barbara [name] MRN: [id number] Date of Birth: 1941/05/08   Medicare Important Message Given:  Yes    Shelbie Ammons, RN 12/20/2015, 9:59 AM

## 2015-12-20 NOTE — Plan of Care (Signed)
Problem: Safety: Goal: Ability to remain free from injury will improve Outcome: Not Progressing Patient very impulsive this shift with repeated attempts to leave bed.  Patient easily reoriented and put back to bed.

## 2015-12-20 NOTE — Progress Notes (Signed)
[facility] [place] NAME: Barbara [name]    MR#:  [id number]  DATE OF BIRTH:  1941/01/13  .  CHIEF COMPLAINT:   Chief Complaint  Patient presents with  . Altered Mental Status  . Rectal Bleeding    REVIEW OF SYSTEMS:   Review of Systems  Gastrointestinal: Positive for nausea.   CONSTITUTIONAL ; bilateral feet pain. EYES: No blurred or double vision.  EARS, NOSE, AND THROAT: No tinnitus or ear pain.  RESPIRATORY: No cough, shortness of breath, wheezing or hemoptysis.  CARDIOVASCULAR: No chest pain, orthopnea, edema.  GASTROINTESTINAL: has some  nausea, no omiting, diarrhea or abdominal pain.  GENITOURINARY: No dysuria, hematuria.  ENDOCRINE: No polyuria, nocturia,  HEMATOLOGY: No anemia, easy bruising or bleeding SKIN: No rash or lesion. MUSCULOSKELETAL:  Decreased pain on right knee and hip NEUROLOGIC: No tingling, numbness, weakness.  PSYCHIATRY: No anxiety or depression.   DRUG ALLERGIES:   Allergies  Allergen Reactions  . Percocet [Oxycodone-Acetaminophen] Hives    VITALS:  Blood pressure 158/59, pulse 73, temperature 99 F (37.2 C), temperature source Oral, resp. rate 18, height 5' 6"  (1.676 m), weight 95.528 kg (210 lb 9.6 oz), SpO2 96 %.  PHYSICAL EXAMINATION:  GENERAL:  75 y.o.-year-old patient lying in the bed with no acute distress.  EYES: Pupils equal, round, reactive to light and accommodation. No scleral icterus. Extraocular muscles intact.  HEENT: Head atraumatic, normocephalic. Oropharynx and nasopharynx clear.  NECK:  Supple, no jugular venous distention. No thyroid enlargement, no tenderness.  LUNGS: Normal breath sounds bilaterally, no wheezing, rales,rhonchi or crepitation. No use of accessory muscles of respiration.  CARDIOVASCULAR: S1, S2 normal. No murmurs, rubs, or gallops.  ABDOMEN: Soft, nontender, nondistended. Bowel sounds present. No organomegaly or mass.  EXTREMITIES: right knee  swollen,sdecreased range of motion in hip and knee,  NEUROLOGIC: Cranial nerves II through XII are intact. Muscle strength 5/5 in all extremities. Sensation intact. Gait not checked.  PSYCHIATRIC: The patient is alert and oriented x 3.  SKIN: No obvious rash, lesion, or ulcer.    LABORATORY PANEL:   CBC  Recent Labs Lab 12/19/15 1111  WBC 14.0*  HGB 8.1*  HCT 25.0*  PLT 303   ------------------------------------------------------------------------------------------------------------------  Chemistries   Recent Labs Lab 12/17/15 2109  12/19/15 1111  NA 138  < > 141  K 3.9  < > 4.1  CL 103  < > 112*  CO2 21*  < > 18*  GLUCOSE 158*  < > 128*  BUN 77*  < > 58*  CREATININE 3.55*  < > 2.64*  CALCIUM 9.6  < > 8.6*  AST 96*  --   --   ALT 40  --   --   ALKPHOS 75  --   --   BILITOT 1.1  --   --   < > = values in this interval not displayed. ------------------------------------------------------------------------------------------------------------------  Cardiac Enzymes No results for input(s): TROPONINI in the last 168 hours. ------------------------------------------------------------------------------------------------------------------  RADIOLOGY:  Dg Knee 1-2 Views Right  12/18/2015  CLINICAL DATA:  Patient has had increased right leg pain post admittance. Patient has a HX of gout. EXAM: RIGHT KNEE - 1-2 VIEW COMPARISON:  None. FINDINGS: No fracture or dislocation. Mild joint space narrowing. Moderate joint effusion. Mild atherosclerotic calcification of the femoral artery. Mild arthritic change in the patellofemoral compartment. IMPRESSION: Mild arthritic change.  Moderate joint effusion. Electronically Signed   By: Skipper Cliche M.D.   On: 12/18/2015  18:59   Dg Hip Unilat With Pelvis 2-3 Views Right  12/18/2015  CLINICAL DATA:  Right hip pain.  No reported injury.  Gout. EXAM: DG HIP (WITH OR WITHOUT PELVIS) 2-3V RIGHT COMPARISON:  10/03/2014 CT abdomen/pelvis.  FINDINGS: No pelvic fracture or diastasis. No right hip fracture or dislocation. 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 No suspicious focal osseous lesions. No appreciable joint erosions in the hips. 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 Electronically Signed   By: Ilona Sorrel M.D.   On: 12/18/2015 18:57    EKG:   Orders placed or performed during the hospital encounter of 12/17/15  . ED EKG  . ED EKG  . EKG 12-Lead  . EKG 12-Lead  . ED EKG  . ED EKG    ASSESSMENT AND PLAN:  1.Sepsis due to BJY:[id number] with IV abx.iv fluids; Urine cultures shows gram negative rods, Waiting for full S to results,   2.Metabolic encephalopathy due to sepsis;improving 3.acute on chronic renal failure;improving with IV  Hydration,check BMP today.started bicarb tablets due to metabolic acidosis  ckd stage 4; 4.right knee swelling,right  Hip pain and ROM is limited; Xray showing degenerative joint disease.  PT Recommend SNF.       5..nausea,and  Dyspepsia;possible acute gastritis;start PPI>   on multiple medications at home for COPD, diabetes, GERD, neuropathy, gout but none of the medications are reconciled. Spoke to the pharmacist to see if we can update med rec, so that  we can restart the medications.  All the records are reviewed and case discussed with Care Management/Social Workerr. Management plans discussed with the patient, family and they are in agreement.  CODE STATUS:  full  TOTAL TIME TAKING CARE OF THIS PATIENT:  35 minutes.   POSSIBLE D/C IN 2-3DAYS, DEPENDING ON CLINICAL CONDITION.   Epifanio Lesches M.D on 12/20/2015 at 9:46 AM  Between 7am to 6pm - Pager - [phone]  After 6pm go to [email or web address] - password [email or web address] [facility]  Office  [phone]  CC: Primary care physician; Madelyn Brunner, MD   Note: This dictation was prepared with Dragon dictation along with  smaller phrase technology. Any transcriptional errors that result from this process are unintentional.

## 2015-12-20 NOTE — Clinical Social Work Note (Signed)
CSW shared that [facility] made a bed offer, which was accepted. Pt is in agreement with discharge plan. CSW attempted to reach pt's spouse, however he did not answer the phone. CSW will continue to follow.   Darden Dates, MSW, LCSW  Clinical Social Worker [phone]

## 2015-12-20 NOTE — Progress Notes (Signed)
Pharmacy Antibiotic Note  Barbara [name] is a 75 y.o. female admitted on 12/17/2015 with UTI.  Pharmacy has been consulted for ceftriaxone dosing.  Plan: Will transition patient to Ceftriaxone 1 gm IV q24h for UTI treatment.  Blood cultures have remained NG since 5/30.  Height: 5' 6"  (167.6 cm) Weight: 210 lb 9.6 oz (95.528 kg) IBW/kg (Calculated) : 59.3  Temp (24hrs), Avg:99.5 F (37.5 C), Min:99 F (37.2 C), Max:99.9 F (37.7 C)   Recent Labs Lab 12/17/15 2109 12/17/15 2110 12/17/15 2300 12/18/15 0037 12/18/15 0504 12/19/15 1111  WBC  --   --  19.9*  --  17.4* 14.0*  CREATININE 3.55*  --   --   --  3.23* 2.64*  LATICACIDVEN  --  1.4  --  0.9  --   --     Estimated Creatinine Clearance: 21.1 mL/min (by C-G formula based on Cr of 2.64).    Allergies  Allergen Reactions  . Percocet [Oxycodone-Acetaminophen] Hives    Antimicrobials this admission: ceftriaxone 5/30 >>    Microbiology results: 5/30 BCx: NGTD 5/30 UCx: >100,000 GNR    5/30 UA: LE(+) NO2(-) WBC TNTC 5/30 CXR: no acute disease  Thank you for allowing pharmacy to be a part of this patient's care.  Barbara [name] G 12/20/2015 8:18 AM

## 2015-12-21 LAB — URINE CULTURE: Culture: >=100000 — AB

## 2015-12-21 LAB — GLUCOSE, CAPILLARY
GLUCOSE-CAPILLARY: 115 mg/dL — AB (ref 65–99)
Glucose-Capillary: 127 mg/dL — ABNORMAL HIGH (ref 65–99)
Glucose-Capillary: 111 mg/dL — ABNORMAL HIGH (ref 65–99)
Glucose-Capillary: 114 mg/dL — ABNORMAL HIGH (ref 65–99)

## 2015-12-21 MED ORDER — INSULIN GLARGINE 100 UNIT/ML ~~LOC~~ SOLN
36.0000 [IU] | Freq: Every day | SUBCUTANEOUS | Status: DC
  Administered 2015-12-21 – 2015-12-22 (×2): 36 [IU] via SUBCUTANEOUS
  Filled 2015-12-21 (×3): qty 0.36

## 2015-12-21 MED ORDER — PANTOPRAZOLE SODIUM 40 MG PO TBEC: 40.0000 mg | DELAYED_RELEASE_TABLET | Freq: Every day | ORAL | Status: DC

## 2015-12-21 MED ORDER — ATORVASTATIN CALCIUM 20 MG PO TABS
40.0000 mg | ORAL_TABLET | Freq: Every day | ORAL | Status: DC
  Administered 2015-12-21 – 2015-12-23 (×3): 40 mg via ORAL
  Filled 2015-12-21 (×3): qty 2

## 2015-12-21 MED ORDER — INSULIN GLARGINE 100 UNIT/ML ~~LOC~~ SOLN
38.0000 [IU] | Freq: Every morning | SUBCUTANEOUS | Status: DC
  Administered 2015-12-22 – 2015-12-23 (×2): 38 [IU] via SUBCUTANEOUS
  Filled 2015-12-21 (×2): qty 0.38

## 2015-12-21 MED ORDER — LATANOPROST 0.005 % OP SOLN
1.0000 [drp] | Freq: Every day | OPHTHALMIC | Status: DC
  Administered 2015-12-21 – 2015-12-22 (×2): 1 [drp] via OPHTHALMIC
  Filled 2015-12-21: qty 2.5

## 2015-12-21 MED ORDER — GABAPENTIN 300 MG PO CAPS
600.0000 mg | ORAL_CAPSULE | Freq: Three times a day (TID) | ORAL | Status: DC
  Administered 2015-12-21 – 2015-12-23 (×6): 600 mg via ORAL
  Filled 2015-12-21 (×6): qty 2

## 2015-12-21 MED ORDER — LOSARTAN POTASSIUM-HCTZ 100-25 MG PO TABS: 1.0000 | ORAL_TABLET | Freq: Every day | ORAL | Status: DC

## 2015-12-21 MED ORDER — AMLODIPINE BESYLATE 10 MG PO TABS
10.0000 mg | ORAL_TABLET | Freq: Every day | ORAL | Status: DC
  Administered 2015-12-21 – 2015-12-23 (×3): 10 mg via ORAL
  Filled 2015-12-21 (×3): qty 1

## 2015-12-21 MED ORDER — LOSARTAN POTASSIUM 50 MG PO TABS
100.0000 mg | ORAL_TABLET | Freq: Every day | ORAL | Status: DC
  Administered 2015-12-21 – 2015-12-23 (×3): 100 mg via ORAL
  Filled 2015-12-21 (×3): qty 2

## 2015-12-21 MED ORDER — HYDROCHLOROTHIAZIDE 25 MG PO TABS
25.0000 mg | ORAL_TABLET | Freq: Every day | ORAL | Status: DC
  Administered 2015-12-21 – 2015-12-23 (×3): 25 mg via ORAL
  Filled 2015-12-21 (×3): qty 1

## 2015-12-21 MED ORDER — CIPROFLOXACIN HCL 250 MG PO TABS
500.0000 mg | ORAL_TABLET | Freq: Two times a day (BID) | ORAL | Status: DC
  Administered 2015-12-21 – 2015-12-22 (×4): 500 mg via ORAL
  Filled 2015-12-21 (×4): qty 2

## 2015-12-21 NOTE — Progress Notes (Signed)
[facility] at [place] NAME: Barbara [name]    MRN#:  [id number]  DATE OF BIRTH:  [birth year]  SUBJECTIVE:  Hospital Day: 3 days Barbara [name] is a 75 y.o. female presenting with Altered Mental Status  .   Overnight events: No overnight events Interval Events: No complaints at this time  REVIEW OF SYSTEMS:  CONSTITUTIONAL: No fever, fatigue or weakness.  EYES: No blurred or double vision.  EARS, NOSE, AND THROAT: No tinnitus or ear pain.  RESPIRATORY: No cough, shortness of breath, wheezing or hemoptysis.  CARDIOVASCULAR: No chest pain, orthopnea, edema.  GASTROINTESTINAL: No nausea, vomiting, diarrhea or abdominal pain.  GENITOURINARY: No dysuria, hematuria.  ENDOCRINE: No polyuria, nocturia,  HEMATOLOGY: No anemia, easy bruising or bleeding SKIN: No rash or lesion. MUSCULOSKELETAL: No joint pain or arthritis.   NEUROLOGIC: No tingling, numbness, weakness.  PSYCHIATRY: No anxiety or depression.   DRUG ALLERGIES:   Allergies  Allergen Reactions  . Percocet [Oxycodone-Acetaminophen] Hives    VITALS:  Blood pressure 164/62, pulse 76, temperature 98.5 F (36.9 C), temperature source Oral, resp. rate 18, height 5' 6"  (1.676 m), weight 210 lb 9.6 oz (95.528 kg), SpO2 92 %.  PHYSICAL EXAMINATION:  VITAL SIGNS: Filed Vitals:   12/20/15 2055 12/21/15 0444  BP: 176/61 164/62  Pulse: 79 76  Temp: 100.5 F (38.1 C) 98.5 F (36.9 C)  Resp: 17 30   GENERAL:75 y.o.female currently in no acute distress.  HEAD: Normocephalic, atraumatic.  EYES: Pupils equal, round, reactive to light. Extraocular muscles intact. No scleral icterus.  MOUTH: Moist mucosal membrane. Dentition intact. No abscess noted.  EAR, NOSE, THROAT: Clear without exudates. No external lesions.  NECK: Supple. No thyromegaly. No nodules. No JVD.  PULMONARY: Clear to ascultation, without wheeze rails or rhonci. No use of accessory muscles, Good respiratory effort. good  air entry bilaterally CHEST: Nontender to palpation.  CARDIOVASCULAR: S1 and S2. Regular rate and rhythm. No murmurs, rubs, or gallops. No edema. Pedal pulses 2+ bilaterally.  GASTROINTESTINAL: Soft, nontender, nondistended. No masses. Positive bowel sounds. No hepatosplenomegaly.  MUSCULOSKELETAL: No swelling, clubbing, or edema. Range of motion full in all extremities.  NEUROLOGIC: Cranial nerves II through XII are intact. No gross focal neurological deficits. Sensation intact. Reflexes intact.  SKIN: No ulceration, lesions, rashes, or cyanosis. Skin warm and dry. Turgor intact.  PSYCHIATRIC: Mood, affect within normal limits. The patient is awake, alert and oriented x 3. Insight, judgment intact.      LABORATORY PANEL:   CBC  Recent Labs Lab 12/19/15 1111  WBC 14.0*  HGB 8.1*  HCT 25.0*  PLT 303   ------------------------------------------------------------------------------------------------------------------  Chemistries   Recent Labs Lab 12/17/15 2109  12/20/15 0957  NA 138  < > 142  K 3.9  < > 3.7  CL 103  < > 112*  CO2 21*  < > 20*  GLUCOSE 158*  < > 133*  BUN 77*  < > 48*  CREATININE 3.55*  < > 2.01*  CALCIUM 9.6  < > 9.3  AST 96*  --   --   ALT 40  --   --   ALKPHOS 75  --   --   BILITOT 1.1  --   --   < > = values in this interval not displayed. ------------------------------------------------------------------------------------------------------------------  Cardiac Enzymes No results for input(s): TROPONINI in the last 168 hours. ------------------------------------------------------------------------------------------------------------------  RADIOLOGY:  No results found.  EKG:   Orders placed or performed during  the hospital encounter of 12/17/15  . ED EKG  . ED EKG  . EKG 12-Lead  . EKG 12-Lead  . ED EKG  . ED EKG    ASSESSMENT AND PLAN:   Barbara [name] is a 75 y.o. female presenting with Altered Mental Status  . Admitted 12/17/2015 :  Day #: 3 days   1.Sepsis secondary to urinary tract infection site unspecified Culture returned pansensitive Escherichia coli change antibiotics to oral ciprofloxacin  2.Metabolic encephalopathy due to sepsis;improving 3.acute on chronic renal failure: Resolved  ckd stage 4; 4.right knee swelling,right Hip pain and ROM is limited; Xray showing degenerative joint disease. PT Recommend SNF.   5.. GERD without esophagitis PPI therapy  Disposition: SNF  All the records are reviewed and case discussed with Care Management/Social Workerr. Management plans discussed with the patient, family and they are in agreement.  CODE STATUS: full TOTAL TIME TAKING CARE OF THIS PATIENT: 28 minutes.   POSSIBLE D/C IN 1-2DAYS, DEPENDING ON CLINICAL CONDITION.   Harnoor Reta,  [name].D on 12/21/2015 at 12:18 PM  Between 7am to 6pm - Pager - [phone]  After 6pm: House Pager: - [phone]  [facility]  Office  [phone]  CC: Primary care physician; Madelyn Brunner, MD

## 2015-12-22 LAB — CULTURE, BLOOD (ROUTINE X 2)
Culture: NO GROWTH
Culture: NO GROWTH

## 2015-12-22 LAB — GLUCOSE, CAPILLARY
Glucose-Capillary: 116 mg/dL — ABNORMAL HIGH (ref 65–99)
Glucose-Capillary: 115 mg/dL — ABNORMAL HIGH (ref 65–99)
Glucose-Capillary: 116 mg/dL — ABNORMAL HIGH (ref 65–99)
Glucose-Capillary: 105 mg/dL — ABNORMAL HIGH (ref 65–99)

## 2015-12-22 NOTE — Progress Notes (Signed)
[place] at [place] NAME: Barbara [name]    MRN#:  [id number]  DATE OF BIRTH:  [birth year]  SUBJECTIVE:  Hospital Day: 4 days Barbara [name] is a 75 y.o. female presenting with Altered Mental Status  .   Overnight events: No overnight events Interval Events: No complaints at this time  REVIEW OF SYSTEMS:  CONSTITUTIONAL: No fever, fatigue or weakness.  EYES: No blurred or double vision.  EARS, NOSE, AND THROAT: No tinnitus or ear pain.  RESPIRATORY: No cough, shortness of breath, wheezing or hemoptysis.  CARDIOVASCULAR: No chest pain, orthopnea, edema.  GASTROINTESTINAL: No nausea, vomiting, diarrhea or abdominal pain.  GENITOURINARY: No dysuria, hematuria.  ENDOCRINE: No polyuria, nocturia,  HEMATOLOGY: No anemia, easy bruising or bleeding SKIN: No rash or lesion. MUSCULOSKELETAL: No joint pain or arthritis.   NEUROLOGIC: No tingling, numbness, weakness.  PSYCHIATRY: No anxiety or depression.   DRUG ALLERGIES:   Allergies  Allergen Reactions  . Percocet [Oxycodone-Acetaminophen] Hives    VITALS:  Blood pressure 147/59, pulse 66, temperature 98.1 F (36.7 C), temperature source Oral, resp. rate 19, height 5' 6"  (1.676 m), weight 210 lb 9.6 oz (95.528 kg), SpO2 93 %.  PHYSICAL EXAMINATION:  VITAL SIGNS: Filed Vitals:   12/21/15 2128 12/22/15 0605  BP: 148/59 147/59  Pulse: 79 66  Temp: 99.2 F (37.3 C) 98.1 F (36.7 C)  Resp: 18 28   GENERAL:75 y.o.female currently in no acute distress.  HEAD: Normocephalic, atraumatic.  EYES: Pupils equal, round, reactive to light. Extraocular muscles intact. No scleral icterus.  MOUTH: Moist mucosal membrane. Dentition intact. No abscess noted.  EAR, NOSE, THROAT: Clear without exudates. No external lesions.  NECK: Supple. No thyromegaly. No nodules. No JVD.  PULMONARY: Clear to ascultation, without wheeze rails or rhonci. No use of accessory muscles, Good respiratory effort. good  air entry bilaterally CHEST: Nontender to palpation.  CARDIOVASCULAR: S1 and S2. Regular rate and rhythm. No murmurs, rubs, or gallops. No edema. Pedal pulses 2+ bilaterally.  GASTROINTESTINAL: Soft, nontender, nondistended. No masses. Positive bowel sounds. No hepatosplenomegaly.  MUSCULOSKELETAL: No swelling, clubbing, or edema. Range of motion full in all extremities.  NEUROLOGIC: Cranial nerves II through XII are intact. No gross focal neurological deficits. Sensation intact. Reflexes intact.  SKIN: No ulceration, lesions, rashes, or cyanosis. Skin warm and dry. Turgor intact.  PSYCHIATRIC: Mood, affect within normal limits. The patient is awake, alert and oriented x 3. Insight, judgment intact.      LABORATORY PANEL:   CBC  Recent Labs Lab 12/19/15 1111  WBC 14.0*  HGB 8.1*  HCT 25.0*  PLT 303   ------------------------------------------------------------------------------------------------------------------  Chemistries   Recent Labs Lab 12/17/15 2109  12/20/15 0957  NA 138  < > 142  K 3.9  < > 3.7  CL 103  < > 112*  CO2 21*  < > 20*  GLUCOSE 158*  < > 133*  BUN 77*  < > 48*  CREATININE 3.55*  < > 2.01*  CALCIUM 9.6  < > 9.3  AST 96*  --   --   ALT 40  --   --   ALKPHOS 75  --   --   BILITOT 1.1  --   --   < > = values in this interval not displayed. ------------------------------------------------------------------------------------------------------------------  Cardiac Enzymes No results for input(s): TROPONINI in the last 168 hours. ------------------------------------------------------------------------------------------------------------------  RADIOLOGY:  No results found.  EKG:   Orders placed or performed during  the hospital encounter of 12/17/15  . ED EKG  . ED EKG  . EKG 12-Lead  . EKG 12-Lead  . ED EKG  . ED EKG    ASSESSMENT AND PLAN:   Barbara [name] is a 75 y.o. female presenting with Altered Mental Status  . Admitted 12/17/2015 :  Day #: 4 days   1.Sepsis secondary to urinary tract infection site unspecified Culture returned pansensitive Escherichia coli Continue ciprofloxacin  2.Metabolic encephalopathy due to sepsis;improving 3.acute on chronic renal failure: Resolved  ckd stage 4; 4.right knee swelling,right Hip pain and ROM is limited; Xray showing degenerative joint disease. PT Recommend SNF.   5.. GERD without esophagitis PPI therapy  Disposition: SNF  All the records are reviewed and case discussed with Care Management/Social Workerr. Management plans discussed with the patient, family and they are in agreement.  CODE STATUS: full TOTAL TIME TAKING CARE OF THIS PATIENT: 28 minutes.   POSSIBLE D/C IN 1-2DAYS, DEPENDING ON CLINICAL CONDITION.   Hower,  [name].D on 12/22/2015 at 11:40 AM  Between 7am to 6pm - Pager - [phone]  After 6pm: House Pager: - [phone]  [facility]  Office  [phone]  CC: Primary care physician; Madelyn Brunner, MD

## 2015-12-22 NOTE — Progress Notes (Signed)
Pt is alert and oriented, bed boud at this time, denies pain, iv fluids infusing, on antibiotics for UTI.

## 2015-12-23 LAB — BASIC METABOLIC PANEL
ANION GAP: 9 (ref 5–15)
BUN: 44 mg/dL — ABNORMAL HIGH (ref 6–20)
CALCIUM: 9.1 mg/dL (ref 8.9–10.3)
CO2: 24 mmol/L (ref 22–32)
Chloride: 109 mmol/L (ref 101–111)
Creatinine, Ser: 2.26 mg/dL — ABNORMAL HIGH (ref 0.44–1.00)
GFR, EST AFRICAN AMERICAN: 23 mL/min — AB (ref >60)
GFR, EST NON AFRICAN AMERICAN: 20 mL/min — AB (ref >60)
Glucose, Bld: 103 mg/dL — ABNORMAL HIGH (ref 65–99)
POTASSIUM: 3.5 mmol/L (ref 3.5–5.1)
Sodium: 142 mmol/L (ref 135–145)

## 2015-12-23 LAB — GLUCOSE, CAPILLARY: Glucose-Capillary: 102 mg/dL — ABNORMAL HIGH (ref 65–99)

## 2015-12-23 MED ORDER — CIPROFLOXACIN HCL 250 MG PO TABS
250.0000 mg | ORAL_TABLET | Freq: Two times a day (BID) | ORAL | Status: DC
  Administered 2015-12-23: 10:00:00 250 mg via ORAL
  Filled 2015-12-23: qty 1

## 2015-12-23 MED ORDER — CIPROFLOXACIN HCL 250 MG PO TABS: 250.0000 mg | ORAL_TABLET | Freq: Two times a day (BID) | ORAL | Status: DC

## 2015-12-23 NOTE — Discharge Summary (Signed)
[facility] NAME: Barbara [name]    MR#:  [id number]  DATE OF BIRTH:  Nov 12, 1940  DATE OF ADMISSION:  12/17/2015 ADMITTING PHYSICIAN: Saundra Shelling, MD  DATE OF DISCHARGE: 12/23/2015  PRIMARY CARE PHYSICIAN: Madelyn Brunner, MD    ADMISSION DIAGNOSIS:  UTI (lower urinary tract infection) [N39.0] Sepsis, due to unspecified organism [facility]) [A41.9]  DISCHARGE DIAGNOSIS:  Principal Problem:   Sepsis ([place]) Active Problems:   UTI (lower urinary tract infection) ecoli uti  SECONDARY DIAGNOSIS:   Past Medical History  Diagnosis Date  . COPD (chronic obstructive pulmonary disease) ([place])   . Hypertension   . Diabetes mellitus without complication ([place])   . Arthritis     HOSPITAL COURSE:  Barbara [name]  is a 75 y.o. female admitted 12/17/2015 with chief complaint Altered Mental Status. Please see H&P performed by Saundra Shelling, MD for further information. Patient presented with the above symptoms. Found to have urinary tract infection. Started on broad antibiotics and transitioned to oral ciprofloxacin when urine cultures revealed pan-sensitive ecoli. Her symptoms have improved but after physical therapy evaluation it was determined she would benefit from SNF placement - the patient and family agree.  DISCHARGE CONDITIONS:   Stable/improved  CONSULTS OBTAINED:     DRUG ALLERGIES:   Allergies  Allergen Reactions  . Percocet [Oxycodone-Acetaminophen] Hives    DISCHARGE MEDICATIONS:   Current Discharge Medication List    START taking these medications   Details  ciprofloxacin (CIPRO) 250 MG tablet Take 1 tablet (250 mg total) by mouth 2 (two) times daily. Qty: 4 tablet, Refills: 0      CONTINUE these medications which have NOT CHANGED   Details  Albuterol Sulfate (PROAIR RESPICLICK) 127 (90 Base) MCG/ACT AEPB Inhale 2 puffs into the lungs daily as needed (wheezing).    amLODipine (NORVASC) 10 MG tablet Take 10 mg by  mouth daily.    atorvastatin (LIPITOR) 40 MG tablet Take 40 mg by mouth daily.    esomeprazole (NEXIUM) 40 MG capsule Take 40 mg by mouth daily at 12 noon.    gabapentin (NEURONTIN) 300 MG capsule Take 600 mg by mouth 3 (three) times daily.    glipiZIDE (GLUCOTROL XL) 5 MG 24 hr tablet Take 5 mg by mouth daily with breakfast.    insulin glargine (LANTUS) 100 UNIT/ML injection Inject 36-38 Units into the skin 2 (two) times daily.    lidocaine (XYLOCAINE) 5 % ointment Apply 1 application topically as needed.    linagliptin (TRADJENTA) 5 MG TABS tablet Take 5 mg by mouth daily.    losartan-hydrochlorothiazide (HYZAAR) 100-25 MG tablet Take 1 tablet by mouth daily.    Travoprost, BAK Free, (TRAVATAN) 0.004 % SOLN ophthalmic solution Place 1 drop into both eyes at bedtime.      STOP taking these medications     HYDROmorphone (DILAUDID) 2 MG tablet          DISCHARGE INSTRUCTIONS:    DIET:  Diabetic diet  DISCHARGE CONDITION:  Stable  ACTIVITY:  Activity as tolerated  OXYGEN:  Home Oxygen: No.   Oxygen Delivery: room air  DISCHARGE LOCATION:  nursing home   If you experience worsening of your admission symptoms, develop shortness of breath, life threatening emergency, suicidal or homicidal thoughts you must seek medical attention immediately by calling 911 or calling your MD immediately  if symptoms less severe.  You Must read complete instructions/literature along with all the possible adverse reactions/side effects  for all the Medicines you take and that have been prescribed to you. Take any new Medicines after you have completely understood and accpet all the possible adverse reactions/side effects.   Please note  You were cared for by a hospitalist during your hospital stay. If you have any questions about your discharge medications or the care you received while you were in the hospital after you are discharged, you can call the unit and asked to speak with the  hospitalist on call if the hospitalist that took care of you is not available. Once you are discharged, your primary care physician will handle any further medical issues. Please note that NO REFILLS for any discharge medications will be authorized once you are discharged, as it is imperative that you return to your primary care physician (or establish a relationship with a primary care physician if you do not have one) for your aftercare needs so that they can reassess your need for medications and monitor your lab values.    On the day of Discharge:   VITAL SIGNS:  Blood pressure 128/55, pulse 61, temperature 97.9 F (36.6 C), temperature source Oral, resp. rate 17, height 5' 6"  (1.676 m), weight 210 lb 9.6 oz (95.528 kg), SpO2 97 %.  I/O:   Intake/Output Summary (Last 24 hours) at 12/23/15 0905 Last data filed at 12/22/15 2151  Gross per 24 hour  Intake   1885 ml  Output      0 ml  Net   1885 ml    PHYSICAL EXAMINATION:  GENERAL:  75 y.o.-year-old patient lying in the bed with no acute distress.  EYES: Pupils equal, round, reactive to light and accommodation. No scleral icterus. Extraocular muscles intact.  HEENT: Head atraumatic, normocephalic. Oropharynx and nasopharynx clear.  NECK:  Supple, no jugular venous distention. No thyroid enlargement, no tenderness.  LUNGS: Normal breath sounds bilaterally, no wheezing, rales,rhonchi or crepitation. No use of accessory muscles of respiration.  CARDIOVASCULAR: S1, S2 normal. No murmurs, rubs, or gallops.  ABDOMEN: Soft, non-tender, non-distended. Bowel sounds present. No organomegaly or mass.  EXTREMITIES: No pedal edema, cyanosis, or clubbing.  NEUROLOGIC: Cranial nerves II through XII are intact. Muscle strength 5/5 in all extremities. Sensation intact. Gait not checked.  PSYCHIATRIC: The patient is alert and oriented x 3.  SKIN: No obvious rash, lesion, or ulcer.   DATA REVIEW:   CBC  Recent Labs Lab 12/19/15 1111  WBC 14.0*   HGB 8.1*  HCT 25.0*  PLT 303    Chemistries   Recent Labs Lab 12/17/15 2109  12/23/15 0527  NA 138  < > 142  K 3.9  < > 3.5  CL 103  < > 109  CO2 21*  < > 24  GLUCOSE 158*  < > 103*  BUN 77*  < > 44*  CREATININE 3.55*  < > 2.26*  CALCIUM 9.6  < > 9.1  AST 96*  --   --   ALT 40  --   --   ALKPHOS 75  --   --   BILITOT 1.1  --   --   < > = values in this interval not displayed.  Cardiac Enzymes No results for input(s): TROPONINI in the last 168 hours.  Microbiology Results  Results for orders placed or performed during the hospital encounter of 12/17/15  Culture, blood (routine x 2)     Status: None   Collection Time: 12/17/15  9:10 PM  Result Value Ref Range Status  Specimen Description BLOOD LEFT ANTECUBITAL  Final   Special Requests BOTTLES DRAWN AEROBIC AND ANAEROBIC 5ML  Final   Culture NO GROWTH 5 DAYS  Final   Report Status 12/22/2015 FINAL  Final  Urine culture     Status: Abnormal   Collection Time: 12/17/15  9:10 PM  Result Value Ref Range Status   Specimen Description URINE, RANDOM  Final   Special Requests NONE  Final   Culture >=100,000 COLONIES/mL ESCHERICHIA COLI (A)  Final   Report Status 12/21/2015 FINAL  Final   Organism ID, Bacteria ESCHERICHIA COLI (A)  Final      Susceptibility   Escherichia coli - MIC*    AMPICILLIN <=2 SENSITIVE Sensitive     CEFAZOLIN <=4 SENSITIVE Sensitive     CEFTRIAXONE <=1 SENSITIVE Sensitive     CIPROFLOXACIN <=0.25 SENSITIVE Sensitive     GENTAMICIN <=1 SENSITIVE Sensitive     IMIPENEM <=0.25 SENSITIVE Sensitive     NITROFURANTOIN <=16 SENSITIVE Sensitive     TRIMETH/SULFA <=20 SENSITIVE Sensitive     AMPICILLIN/SULBACTAM <=2 SENSITIVE Sensitive     PIP/TAZO <=4 SENSITIVE Sensitive     Extended ESBL NEGATIVE Sensitive     * >=100,000 COLONIES/mL ESCHERICHIA COLI  Culture, blood (routine x 2)     Status: None   Collection Time: 12/17/15  9:50 PM  Result Value Ref Range Status   Specimen Description BLOOD  LEFT ARM  Final   Special Requests BOTTLES DRAWN AEROBIC AND ANAEROBIC 5ML  Final   Culture NO GROWTH 5 DAYS  Final   Report Status 12/22/2015 FINAL  Final  C difficile quick scan w PCR reflex     Status: None   Collection Time: 12/18/15  3:35 AM  Result Value Ref Range Status   C Diff antigen NEGATIVE NEGATIVE Final   C Diff toxin NEGATIVE NEGATIVE Final   C Diff interpretation Negative for C. difficile  Final    RADIOLOGY:  No results found.   Management plans discussed with the patient, family and they are in agreement.  CODE STATUS:     Code Status Orders        Start     Ordered   12/18/15 0215  Full code   Continuous     12/18/15 0214    Code Status History    Date Active Date Inactive Code Status Order ID Comments User Context   This patient has a current code status but no historical code status.      TOTAL TIME TAKING CARE OF THIS PATIENT: 32 minutes.    Hower,  [name].D on 12/23/2015 at 9:05 AM  Between 7am to 6pm - Pager - [phone]  After 6pm go to [email or web address] - Proofreader  [employer]  Hospitalists  Office  [phone]  CC: Primary care physician; Madelyn Brunner, MD

## 2015-12-23 NOTE — Progress Notes (Signed)
Pharmacy Antibiotic Note  Barbara [name] is a 75 y.o. female admitted on 12/17/2015 with UTI.  Pharmacy has been consulted for ciprofloxacin dosing.  Plan: The dose of ciprofloxacin will be adjusted to 250 mg po BID based on renal function.  Height: 5' 6"  (167.6 cm) Weight: 210 lb 9.6 oz (95.528 kg) IBW/kg (Calculated) : 59.3  Temp (24hrs), Avg:98.6 F (37 C), Min:97.9 F (36.6 C), Max:99.1 F (37.3 C)   Recent Labs Lab 12/17/15 2109 12/17/15 2110 12/17/15 2300 12/18/15 0037 12/18/15 0504 12/19/15 1111 12/20/15 0957 12/23/15 0527  WBC  --   --  19.9*  --  17.4* 14.0*  --   --   CREATININE 3.55*  --   --   --  3.23* 2.64* 2.01* 2.26*  LATICACIDVEN  --  1.4  --  0.9  --   --   --   --     Estimated Creatinine Clearance: 24.7 mL/min (by C-G formula based on Cr of 2.26).    Allergies  Allergen Reactions  . Percocet [Oxycodone-Acetaminophen] Hives    Antimicrobials this admission: Ceftriaxone 5/30 >> 6/3 Ciprofloxacin 6/3 >>    Microbiology results: 5/30 BCx: pending 5/30 UCx: E.Coli-pansensitive   Thank you for allowing pharmacy to be a part of this patient's care.  Caroline Matters G 12/23/2015 7:50 AM

## 2015-12-23 NOTE — Care Management Important Message (Signed)
Important Message  Patient Details  Name: Barbara [name] MRN: [id number] Date of Birth: [birth year]   Medicare Important Message Given:  Yes    Shelbie Ammons, RN 12/23/2015, 8:43 AM

## 2015-12-23 NOTE — Progress Notes (Signed)
Patient discharged to [facility] Report given to [facility] at facility. EMS called for transportation.

## 2015-12-23 NOTE — Progress Notes (Signed)
Spoke with Arville Go, [employer] rep at [phone], to notify of non-emergent EMS transport.  Auth notification reference given as [id number].   Service date range good from 12/23/15 - 03/21/16.   Gap exception requested to determine if services can be considered at an in-network level.

## 2015-12-28 NOTE — Clinical Social Work Placement (Signed)
   CLINICAL SOCIAL WORK PLACEMENT  NOTE  Date:  12/28/2015  Patient Details  Name: Barbara [name] MRN: [id number] Date of Birth: [birth year]  Clinical Social Work is seeking post-discharge placement for this patient at the [place] level of care (*CSW will initial, date and re-position this form in  chart as items are completed):  Yes   Patient/family provided with [place] Work Department's list of facilities offering this level of care within the geographic area requested by the patient (or if unable, by the patient's family).  Yes   Patient/family informed of their freedom to choose among providers that offer the needed level of care, that participate in Medicare, Medicaid or managed care program needed by the patient, have an available bed and are willing to accept the patient.  Yes   Patient/family informed of 's ownership interest in [facility] and [facility], as well as of the fact that they are under no obligation to receive care at these facilities.  PASRR submitted to EDS on 12/19/15     PASRR number received on 12/19/15     Existing PASRR number confirmed on       FL2 transmitted to all facilities in geographic area requested by pt/family on 12/19/15     FL2 transmitted to all facilities within larger geographic area on       Patient informed that his/her managed care company has contracts with or will negotiate with certain facilities, including the following:        Yes   Patient/family informed of bed offers received.  Patient chooses bed at [name]     Physician recommends and patient chooses bed at      Patient to be transferred to [facility] on 12/23/15.  Patient to be transferred to facility by [facility]     Patient family notified on 12/23/15 of transfer.  Name of family member notified:  Pt updated her husband and daughter     PHYSICIAN       Additional Comment:     _______________________________________________ Darden Dates, LCSW 12/28/2015, 10:32 AM

## 2015-12-28 NOTE — Clinical Social Work Note (Signed)
Pt ready for discharge to [facility]. Facility is ready to admit pt as they have received discharge information. Pt and family are aware. RN called report and EMS provided transportation. CSW is signing off as no further needs identified.   Darden Dates, MSW, LCSW  Clinical Social Worker [phone]

## 2016-01-18 DIAGNOSIS — N139 Obstructive and reflux uropathy, unspecified: Secondary | ICD-10-CM

## 2016-01-18 HISTORY — DX: Obstructive and reflux uropathy, unspecified: N13.9

## 2016-01-20 ENCOUNTER — Emergency Department: Payer: Medicare Other

## 2016-01-20 ENCOUNTER — Encounter: Payer: Self-pay | Admitting: Emergency Medicine

## 2016-01-20 ENCOUNTER — Inpatient Hospital Stay
Admission: EM | Admit: 2016-01-20 | Discharge: 2016-01-21 | DRG: 694 | Disposition: A | Discharge status: Other Acute Inpatient Hospital | Payer: Medicare Other | Attending: Internal Medicine | Admitting: Internal Medicine

## 2016-01-20 DIAGNOSIS — N2581 Secondary hyperparathyroidism of renal origin: Secondary | ICD-10-CM | POA: Diagnosis present

## 2016-01-20 DIAGNOSIS — Z90710 Acquired absence of both cervix and uterus: Secondary | ICD-10-CM | POA: Diagnosis not present

## 2016-01-20 DIAGNOSIS — N390 Urinary tract infection, site not specified: Secondary | ICD-10-CM | POA: Diagnosis present

## 2016-01-20 DIAGNOSIS — Z6830 Body mass index (BMI) 30.0-30.9, adult: Secondary | ICD-10-CM | POA: Diagnosis not present

## 2016-01-20 DIAGNOSIS — N184 Chronic kidney disease, stage 4 (severe): Secondary | ICD-10-CM | POA: Diagnosis present

## 2016-01-20 DIAGNOSIS — C679 Malignant neoplasm of bladder, unspecified: Secondary | ICD-10-CM | POA: Diagnosis present

## 2016-01-20 DIAGNOSIS — Z794 Long term (current) use of insulin: Secondary | ICD-10-CM | POA: Diagnosis not present

## 2016-01-20 DIAGNOSIS — D72829 Elevated white blood cell count, unspecified: Secondary | ICD-10-CM

## 2016-01-20 DIAGNOSIS — N135 Crossing vessel and stricture of ureter without hydronephrosis: Secondary | ICD-10-CM

## 2016-01-20 DIAGNOSIS — N131 Hydronephrosis with ureteral stricture, not elsewhere classified: Principal | ICD-10-CM | POA: Diagnosis present

## 2016-01-20 DIAGNOSIS — E669 Obesity, unspecified: Secondary | ICD-10-CM | POA: Diagnosis present

## 2016-01-20 DIAGNOSIS — N179 Acute kidney failure, unspecified: Secondary | ICD-10-CM | POA: Diagnosis present

## 2016-01-20 DIAGNOSIS — G4733 Obstructive sleep apnea (adult) (pediatric): Secondary | ICD-10-CM | POA: Diagnosis present

## 2016-01-20 DIAGNOSIS — Z79899 Other long term (current) drug therapy: Secondary | ICD-10-CM | POA: Diagnosis not present

## 2016-01-20 DIAGNOSIS — E11649 Type 2 diabetes mellitus with hypoglycemia without coma: Secondary | ICD-10-CM | POA: Diagnosis present

## 2016-01-20 DIAGNOSIS — N1330 Unspecified hydronephrosis: Secondary | ICD-10-CM

## 2016-01-20 DIAGNOSIS — Z803 Family history of malignant neoplasm of breast: Secondary | ICD-10-CM | POA: Diagnosis not present

## 2016-01-20 DIAGNOSIS — Z87891 Personal history of nicotine dependence: Secondary | ICD-10-CM

## 2016-01-20 DIAGNOSIS — I129 Hypertensive chronic kidney disease with stage 1 through stage 4 chronic kidney disease, or unspecified chronic kidney disease: Secondary | ICD-10-CM | POA: Diagnosis present

## 2016-01-20 DIAGNOSIS — E1122 Type 2 diabetes mellitus with diabetic chronic kidney disease: Secondary | ICD-10-CM | POA: Diagnosis present

## 2016-01-20 DIAGNOSIS — Z7982 Long term (current) use of aspirin: Secondary | ICD-10-CM

## 2016-01-20 DIAGNOSIS — R1031 Right lower quadrant pain: Secondary | ICD-10-CM | POA: Diagnosis not present

## 2016-01-20 DIAGNOSIS — D631 Anemia in chronic kidney disease: Secondary | ICD-10-CM | POA: Diagnosis present

## 2016-01-20 DIAGNOSIS — M1990 Unspecified osteoarthritis, unspecified site: Secondary | ICD-10-CM | POA: Diagnosis present

## 2016-01-20 DIAGNOSIS — J449 Chronic obstructive pulmonary disease, unspecified: Secondary | ICD-10-CM | POA: Diagnosis present

## 2016-01-20 DIAGNOSIS — D494 Neoplasm of unspecified behavior of bladder: Secondary | ICD-10-CM | POA: Diagnosis not present

## 2016-01-20 DIAGNOSIS — Z9889 Other specified postprocedural states: Secondary | ICD-10-CM

## 2016-01-20 DIAGNOSIS — Z885 Allergy status to narcotic agent status: Secondary | ICD-10-CM | POA: Diagnosis not present

## 2016-01-20 DIAGNOSIS — N189 Chronic kidney disease, unspecified: Secondary | ICD-10-CM

## 2016-01-20 LAB — URINALYSIS COMPLETE WITH MICROSCOPIC (ARMC ONLY)
Bilirubin Urine: NEGATIVE
GLUCOSE, UA: NEGATIVE mg/dL
HGB URINE DIPSTICK: NEGATIVE
Ketones, ur: NEGATIVE mg/dL
LEUKOCYTES UA: NEGATIVE
NITRITE: NEGATIVE
Protein, ur: NEGATIVE mg/dL
Specific Gravity, Urine: 1.009 (ref 1.005–1.030)
Trans Epithel, UA: <1
pH: 5 (ref 5.0–8.0)

## 2016-01-20 LAB — CBC
HCT: 24 % — ABNORMAL LOW (ref 35.0–47.0)
HCT: 24.1 % — ABNORMAL LOW (ref 35.0–47.0)
HEMOGLOBIN: 7.6 g/dL — AB (ref 12.0–16.0)
Hemoglobin: 7.8 g/dL — ABNORMAL LOW (ref 12.0–16.0)
MCH: 27.4 pg (ref 26.0–34.0)
MCH: 27.5 pg (ref 26.0–34.0)
MCHC: 32.6 g/dL (ref 32.0–36.0)
MCHC: 31.6 g/dL — AB (ref 32.0–36.0)
MCV: 84.2 fL (ref 80.0–100.0)
MCV: 87.1 fL (ref 80.0–100.0)
PLATELETS: 237 10*3/uL (ref 150–440)
PLATELETS: 217 10*3/uL (ref 150–440)
RBC: 2.85 MIL/uL — AB (ref 3.80–5.20)
RBC: 2.77 MIL/uL — ABNORMAL LOW (ref 3.80–5.20)
RDW: 16.1 % — AB (ref 11.5–14.5)
RDW: 15.9 % — AB (ref 11.5–14.5)
WBC: 11.7 10*3/uL — AB (ref 3.6–11.0)
WBC: 12.1 10*3/uL — ABNORMAL HIGH (ref 3.6–11.0)

## 2016-01-20 LAB — COMPREHENSIVE METABOLIC PANEL
ALT: 13 U/L — AB (ref 14–54)
AST: 17 U/L (ref 15–41)
Albumin: 3.5 g/dL (ref 3.5–5.0)
Alkaline Phosphatase: 70 U/L (ref 38–126)
Anion gap: 12 (ref 5–15)
BILIRUBIN TOTAL: 0.3 mg/dL (ref 0.3–1.2)
BUN: 60 mg/dL — AB (ref 6–20)
CHLORIDE: 107 mmol/L (ref 101–111)
CO2: 19 mmol/L — ABNORMAL LOW (ref 22–32)
CREATININE: 4.89 mg/dL — AB (ref 0.44–1.00)
Calcium: 8.6 mg/dL — ABNORMAL LOW (ref 8.9–10.3)
GFR, EST AFRICAN AMERICAN: 9 mL/min — AB (ref >60)
GFR, EST NON AFRICAN AMERICAN: 8 mL/min — AB (ref >60)
Glucose, Bld: 84 mg/dL (ref 65–99)
POTASSIUM: 4.2 mmol/L (ref 3.5–5.1)
Sodium: 138 mmol/L (ref 135–145)
TOTAL PROTEIN: 8.1 g/dL (ref 6.5–8.1)

## 2016-01-20 LAB — LIPASE, BLOOD: LIPASE: 48 U/L (ref 11–51)

## 2016-01-20 LAB — CREATININE, SERUM
CREATININE: 4.78 mg/dL — AB (ref 0.44–1.00)
GFR calc Af Amer: 9 mL/min — ABNORMAL LOW (ref >60)
GFR calc non Af Amer: 8 mL/min — ABNORMAL LOW (ref >60)

## 2016-01-20 LAB — GLUCOSE, CAPILLARY
GLUCOSE-CAPILLARY: 86 mg/dL (ref 65–99)
GLUCOSE-CAPILLARY: 82 mg/dL (ref 65–99)
Glucose-Capillary: 57 mg/dL — ABNORMAL LOW (ref 65–99)

## 2016-01-20 MED ORDER — CETYLPYRIDINIUM CHLORIDE 0.05 % MT LIQD
7.0000 mL | Freq: Two times a day (BID) | OROMUCOSAL | Status: DC
  Administered 2016-01-21 (×2): 7 mL via OROMUCOSAL

## 2016-01-20 MED ORDER — LATANOPROST 0.005 % OP SOLN
1.0000 [drp] | Freq: Every day | OPHTHALMIC | Status: DC
  Administered 2016-01-20: 1 [drp] via OPHTHALMIC
  Filled 2016-01-20: qty 2.5

## 2016-01-20 MED ORDER — ALBUTEROL SULFATE (2.5 MG/3ML) 0.083% IN NEBU: 3.0000 mL | INHALATION_SOLUTION | Freq: Every day | RESPIRATORY_TRACT | Status: DC | PRN

## 2016-01-20 MED ORDER — ACETAMINOPHEN 650 MG RE SUPP
650.0000 mg | Freq: Four times a day (QID) | RECTAL | Status: DC | PRN
  Administered 2016-01-21 (×2): 650 mg via RECTAL
  Filled 2016-01-20 (×2): qty 1

## 2016-01-20 MED ORDER — INSULIN ASPART 100 UNIT/ML ~~LOC~~ SOLN: 3.0000 [IU] | Freq: Three times a day (TID) | SUBCUTANEOUS | Status: DC

## 2016-01-20 MED ORDER — PANTOPRAZOLE SODIUM 40 MG PO TBEC
40.0000 mg | DELAYED_RELEASE_TABLET | Freq: Every day | ORAL | Status: DC
  Administered 2016-01-21: 40 mg via ORAL
  Filled 2016-01-20: qty 1

## 2016-01-20 MED ORDER — ACETAMINOPHEN 325 MG PO TABS: 650.0000 mg | ORAL_TABLET | Freq: Four times a day (QID) | ORAL | Status: DC | PRN

## 2016-01-20 MED ORDER — SODIUM CHLORIDE 0.9% FLUSH
3.0000 mL | Freq: Two times a day (BID) | INTRAVENOUS | Status: DC
  Administered 2016-01-20 – 2016-01-21 (×2): 3 mL via INTRAVENOUS

## 2016-01-20 MED ORDER — AMLODIPINE BESYLATE 10 MG PO TABS
10.0000 mg | ORAL_TABLET | Freq: Every day | ORAL | Status: DC
  Administered 2016-01-21: 10 mg via ORAL
  Filled 2016-01-20: qty 1

## 2016-01-20 MED ORDER — INSULIN GLARGINE 100 UNIT/ML ~~LOC~~ SOLN
30.0000 [IU] | Freq: Every day | SUBCUTANEOUS | Status: DC
  Filled 2016-01-20: qty 0.3

## 2016-01-20 MED ORDER — ONDANSETRON HCL 4 MG/2ML IJ SOLN: 4.0000 mg | Freq: Four times a day (QID) | INTRAMUSCULAR | Status: DC | PRN

## 2016-01-20 MED ORDER — ATORVASTATIN CALCIUM 20 MG PO TABS
40.0000 mg | ORAL_TABLET | Freq: Every day | ORAL | Status: DC
  Administered 2016-01-21: 40 mg via ORAL
  Filled 2016-01-20: qty 2

## 2016-01-20 MED ORDER — HYDROCODONE-ACETAMINOPHEN 5-325 MG PO TABS
ORAL_TABLET | ORAL | Status: AC
  Administered 2016-01-20: 1
  Filled 2016-01-20: qty 1

## 2016-01-20 MED ORDER — HYDROCODONE-ACETAMINOPHEN 5-325 MG PO TABS: 1.0000 | ORAL_TABLET | ORAL | Status: DC | PRN

## 2016-01-20 MED ORDER — TRAMADOL HCL 50 MG PO TABS: 50.0000 mg | ORAL_TABLET | Freq: Four times a day (QID) | ORAL | Status: DC | PRN

## 2016-01-20 MED ORDER — SODIUM CHLORIDE 0.9 % IV SOLN
Freq: Once | INTRAVENOUS | Status: AC
  Administered 2016-01-20: 16:00:00 via INTRAVENOUS

## 2016-01-20 MED ORDER — SODIUM CHLORIDE 0.9 % IV SOLN
INTRAVENOUS | Status: DC
  Administered 2016-01-20: 19:00:00 via INTRAVENOUS

## 2016-01-20 MED ORDER — DEXTROSE 5 % IV SOLN
1.0000 g | INTRAVENOUS | Status: DC
  Administered 2016-01-20: 1 g via INTRAVENOUS
  Filled 2016-01-20 (×2): qty 10

## 2016-01-20 MED ORDER — ONDANSETRON HCL 4 MG PO TABS
4.0000 mg | ORAL_TABLET | Freq: Four times a day (QID) | ORAL | Status: DC | PRN
Start: 2016-01-20 — End: 2016-01-21

## 2016-01-20 MED ORDER — INSULIN ASPART 100 UNIT/ML ~~LOC~~ SOLN: 0.0000 [IU] | Freq: Three times a day (TID) | SUBCUTANEOUS | Status: DC

## 2016-01-20 MED ORDER — HEPARIN SODIUM (PORCINE) 5000 UNIT/ML IJ SOLN
5000.0000 [IU] | Freq: Three times a day (TID) | INTRAMUSCULAR | Status: DC
  Administered 2016-01-20 – 2016-01-21 (×2): 5000 [IU] via SUBCUTANEOUS
  Filled 2016-01-20 (×2): qty 1

## 2016-01-20 NOTE — ED Provider Notes (Signed)
[facility] Emergency Department Provider Note        Time seen: ----------------------------------------- 2:29 PM on 01/20/2016 -----------------------------------------    I have reviewed the triage vital signs and the nursing notes.   HISTORY  Chief Complaint Hyperglycemia    HPI Barbara [name] is a 75 y.o. female who presents to ER for blood sugar abnormalities. She reports sugar was 76 this morning but then rechecked it and it read critical high. She was recently at [facility] for rehabilitation and was discharged last Tuesday. She was recently admitted for urosepsis. She has had a fever, she complains of left side pain. Patient states she is urinating normally.   Past Medical History  Diagnosis Date  . COPD (chronic obstructive pulmonary disease) ([place])   . Hypertension   . Diabetes mellitus without complication ([place])   . Arthritis     Patient Active Problem List   Diagnosis Date Noted  . Sepsis ([place]) 12/18/2015  . UTI (lower urinary tract infection) 12/18/2015    Past Surgical History  Procedure Laterality Date  . Abdominal hysterectomy    . Rotator cuff repair      left    Allergies Percocet  Social History Social History  Substance Use Topics  . Smoking status: Former Research scientist (life sciences)  . Smokeless tobacco: None  . Alcohol Use: No    Review of Systems Constitutional: Negative for fever. Eyes: Negative for visual changes. ENT: Negative for sore throat. Cardiovascular: Negative for chest pain. Respiratory: Negative for shortness of breath. Gastrointestinal: Positive for abdominal pain Genitourinary: Negative for dysuria. Musculoskeletal: Negative for back pain. Skin: Negative for rash. Neurological: Negative for headaches, focal weakness or numbness.  10-point ROS otherwise negative.  ____________________________________________   PHYSICAL EXAM:  VITAL SIGNS: ED Triage Vitals  Enc Vitals Group     BP 01/20/16  1315 124/42 mmHg     Pulse Rate 01/20/16 1315 90     Resp 01/20/16 1315 18     Temp 01/20/16 1315 99.1 F (37.3 C)     Temp Source 01/20/16 1315 Oral     SpO2 01/20/16 1315 94 %     Weight 01/20/16 1315 202 lb (91.627 kg)     Height 01/20/16 1315 5' 8"  (1.727 m)     Head Cir --      Peak Flow --      Pain Score 01/20/16 1315 4     Pain Loc --      Pain Edu? --      Excl. in [place]? --    Constitutional: Alert and oriented. Well appearing and in no distress. Eyes: Conjunctivae are normal. PERRL. Normal extraocular movements. ENT   Head: Normocephalic and atraumatic.   Nose: No congestion/rhinnorhea.   Mouth/Throat: Mucous membranes are moist.   Neck: No stridor. Cardiovascular: Normal rate, regular rhythm. No murmurs, rubs, or gallops. Respiratory: Normal respiratory effort without tachypnea nor retractions. Breath sounds are clear and equal bilaterally. No wheezes/rales/rhonchi. Gastrointestinal: Soft and nontender. Normal bowel sounds Musculoskeletal: Nontender with normal range of motion in all extremities. No lower extremity tenderness nor edema. Neurologic:  Normal speech and language. No gross focal neurologic deficits are appreciated.  Skin:  Skin is warm, dry and intact. No rash noted. Psychiatric: Mood and affect are normal. Speech and behavior are normal.  ____________________________________________  EKG: Interpreted by me. Sinus rhythm with a rate of 90 bpm, normal PR interval, normal QRS, normal QT interval. Normal axis.  ____________________________________________  ED COURSE:  Pertinent  labs & imaging results that were available during my care of the patient were reviewed by me and considered in my medical decision making (see chart for details). Patient presents to ER with blood sugar abnormalities, recent hospital admission for urosepsis. I will recheck basic labs and reevaluate. ____________________________________________    LABS (pertinent  positives/negatives)  Labs Reviewed  COMPREHENSIVE METABOLIC PANEL - Abnormal; Notable for the following:    CO2 19 (*)    BUN 60 (*)    Creatinine, Ser 4.89 (*)    Calcium 8.6 (*)    ALT 13 (*)    GFR calc non Af Amer 8 (*)    GFR calc Af Amer 9 (*)    All other components within normal limits  CBC - Abnormal; Notable for the following:    WBC 11.7 (*)    RBC 2.85 (*)    Hemoglobin 7.8 (*)    HCT 24.0 (*)    RDW 16.1 (*)    All other components within normal limits  URINALYSIS COMPLETEWITH MICROSCOPIC ([facility] ONLY) - Abnormal; Notable for the following:    Color, Urine STRAW (*)    APPearance CLEAR (*)    Bacteria, UA RARE (*)    Squamous Epithelial / LPF 0-5 (*)    All other components within normal limits  URINE CULTURE  GLUCOSE, CAPILLARY  LIPASE, BLOOD    RADIOLOGY Images were viewed by me  CT renal protocol IMPRESSION: Mild to moderate left hydroureteronephrosis due to a soft tissue mass at the left UVJ worrisome for carcinoma. Soft tissue attenuation worrisome for mass is also seen along the left side of the urinary bladder. There are no urinary tract stones.  Extensive calcific aortic and coronary atherosclerosis.  Diverticulosis without diverticulitis. ____________________________________________  FINAL ASSESSMENT AND PLAN  Hypoglycemia, flank pain, acute on chronic renal failure, soft tissue mass at the left UVJ  Plan: Patient with labs and imaging as dictated above. Patient presents with hypoglycemia of uncertain etiology. Renal function has gotten worse. We have started normal saline boluses. She did have 600 cc's on bladder scan but was able to urinate and appeared to completely empty her bladder. I will discuss with urology.  Patient discussed with urology, will need ureteral stenting. I discussed with the hospitalist for admission and urology consultation. We have sent a urine culture as well. Earleen Newport, MD   Note: This dictation was  prepared with Dragon dictation. Any transcriptional errors that result from this process are unintentional   Earleen Newport, MD 01/20/16 1538

## 2016-01-20 NOTE — ED Notes (Signed)
Patient transported to CT 

## 2016-01-20 NOTE — ED Notes (Addendum)
Patient states this morning blood sugar was in the 76 so patient drank some orange juice and ate some breakfast and waited "a while" and recheck blood sugar and it read "HI".  CBG in triage 86.  Patient was recently at [facility] for rehab and was discharged home last Tuesday.  Patient is still receiving PT at home but believes that [employer] did not send her home with all of her medications.

## 2016-01-20 NOTE — ED Notes (Signed)
Pt reports she took tylenol this morning

## 2016-01-20 NOTE — Consult Note (Signed)
Urology Consult   Physician requesting consult: Dr Ether Griffins  Reason for consult:Left renal obstruction/pelvic mass  tory of Present Illness: Barbara [name] is a 75 y.o. female admitted to the hospital today for worsening acute renal insufficiency, with findings of left hydroureteronephrosis and a pelvic mass on CT scan. The patient was admitted to this hospital last month for urinary tract infection with sepsis from Escherichia coli. The patient has stage IV chronic kidney disease, COPD, hypertension, diabetes mellitus and arthritis. She has had left flank and lower abdominal pain for several weeks. This has increased recently. She was taken to the emergency room because of chills, weakness, and this abdominal pain. The CT was performed revealing significant left hydroureteronephrosis. This was not present on her last CT scan from March 2016. Because of the above issues, with a hydronephrosis, urologic consultation is requested.     Past Medical History  Diagnosis Date  . COPD (chronic obstructive pulmonary disease) ([place])   . Hypertension   . Diabetes mellitus without complication ([place])   . Arthritis     Past Surgical History  Procedure Laterality Date  . Abdominal hysterectomy    . Rotator cuff repair      left     Current Hospital Medications: Scheduled Meds: . [START ON 01/21/2016] amLODipine  10 mg Oral Daily  . [START ON 01/21/2016] atorvastatin  40 mg Oral Daily  . cefTRIAXone (ROCEPHIN)  IV  1 g Intravenous Q24H  . heparin  5,000 Units Subcutaneous Q8H  . [START ON 01/21/2016] insulin aspart  0-9 Units Subcutaneous TID WC  . [START ON 01/21/2016] insulin aspart  3 Units Subcutaneous TID WC  . [START ON 01/21/2016] insulin glargine  30 Units Subcutaneous Daily  . latanoprost  1 drop Both Eyes QHS  . [START ON 01/21/2016] pantoprazole  40 mg Oral Daily  . sodium chloride flush  3 mL Intravenous Q12H   Continuous Infusions: . sodium chloride 75 mL/hr at 01/20/16 1851   PRN  Meds:.acetaminophen **OR** acetaminophen, albuterol, HYDROcodone-acetaminophen, ondansetron **OR** ondansetron (ZOFRAN) IV  Allergies:  Allergies  Allergen Reactions  . Percocet [Oxycodone-Acetaminophen] Hives    Family History  Problem Relation Age of Onset  . Breast cancer Neg Hx     Social History:  reports that she has quit smoking. She does not have any smokeless tobacco history on file. She reports that she does not drink alcohol or use illicit drugs.  ROS: A complete review of systems was performed.  All systems are negative except for pertinent findings as noted.  Physical Exam:  Vital signs in last 24 hours: Temp:  [97.5 F (36.4 C)-99.8 F (37.7 C)] 97.5 F (36.4 C) (07/03 1849) Pulse Rate:  [86-90] 90 (07/03 1849) Resp:  [18-27] 20 (07/03 1849) BP: (100-155)/(42-55) 100/44 mmHg (07/03 1849) SpO2:  [93 %-94 %] 94 % (07/03 1849) Weight:  [91.627 kg (202 lb)] 91.627 kg (202 lb) (07/03 1315) General:  Alert and oriented, No acute distress HEENT: Normocephalic, atraumatic Neck: No JVD or lymphadenopathy Cardiovascular: Regular rate and rhythm Lungs: Clear bilaterally Abdomen: Obese, soft, left lower quadrant tenderness.  Back:left CVA tenderness noted  Extremities: No edema Neurologic: Grossly intact  Laboratory Data:   Recent Labs  01/20/16 1303 01/20/16 1859  WBC 11.7* 12.1*  HGB 7.8* 7.6*  HCT 24.0* 24.1*  PLT 237 217     Recent Labs  01/20/16 1303  NA 138  K 4.2  CL 107  GLUCOSE 84  BUN 60*  CALCIUM 8.6*  CREATININE  4.89*     Results for orders placed or performed during the hospital encounter of 01/20/16 (from the past 24 hour(s))  Lipase, blood     Status: None   Collection Time: 01/20/16  1:03 PM  Result Value Ref Range   Lipase 48 11 - 51 U/L  Comprehensive metabolic panel     Status: Abnormal   Collection Time: 01/20/16  1:03 PM  Result Value Ref Range   Sodium 138 135 - 145 mmol/L   Potassium 4.2 3.5 - 5.1 mmol/L   Chloride  107 101 - 111 mmol/L   CO2 19 (L) 22 - 32 mmol/L   Glucose, Bld 84 65 - 99 mg/dL   BUN 60 (H) 6 - 20 mg/dL   Creatinine, Ser 4.89 (H) 0.44 - 1.00 mg/dL   Calcium 8.6 (L) 8.9 - 10.3 mg/dL   Total Protein 8.1 6.5 - 8.1 g/dL   Albumin 3.5 3.5 - 5.0 g/dL   AST 17 15 - 41 U/L   ALT 13 (L) 14 - 54 U/L   Alkaline Phosphatase 70 38 - 126 U/L   Total Bilirubin 0.3 0.3 - 1.2 mg/dL   GFR calc non Af Amer 8 (L) >60 mL/min   GFR calc Af Amer 9 (L) >60 mL/min   Anion gap 12 5 - 15  CBC     Status: Abnormal   Collection Time: 01/20/16  1:03 PM  Result Value Ref Range   WBC 11.7 (H) 3.6 - 11.0 K/uL   RBC 2.85 (L) 3.80 - 5.20 MIL/uL   Hemoglobin 7.8 (L) 12.0 - 16.0 g/dL   HCT 24.0 (L) 35.0 - 47.0 %   MCV 84.2 80.0 - 100.0 fL   MCH 27.4 26.0 - 34.0 pg   MCHC 32.6 32.0 - 36.0 g/dL   RDW 16.1 (H) 11.5 - 14.5 %   Platelets 237 150 - 440 K/uL  Glucose, capillary     Status: None   Collection Time: 01/20/16  1:13 PM  Result Value Ref Range   Glucose-Capillary 86 65 - 99 mg/dL  Urinalysis complete, with microscopic     Status: Abnormal   Collection Time: 01/20/16  3:00 PM  Result Value Ref Range   Color, Urine STRAW (A) YELLOW   APPearance CLEAR (A) CLEAR   Glucose, UA NEGATIVE NEGATIVE mg/dL   Bilirubin Urine NEGATIVE NEGATIVE   Ketones, ur NEGATIVE NEGATIVE mg/dL   Specific Gravity, Urine 1.009 1.005 - 1.030   Hgb urine dipstick NEGATIVE NEGATIVE   pH 5.0 5.0 - 8.0   Protein, ur NEGATIVE NEGATIVE mg/dL   Nitrite NEGATIVE NEGATIVE   Leukocytes, UA NEGATIVE NEGATIVE   RBC / HPF 0-5 0 - 5 RBC/hpf   WBC, UA 6-30 0 - 5 WBC/hpf   Bacteria, UA RARE (A) NONE SEEN   Squamous Epithelial / LPF 0-5 (A) NONE SEEN   Trans Epithel, UA <1    Mucous PRESENT   CBC     Status: Abnormal   Collection Time: 01/20/16  6:59 PM  Result Value Ref Range   WBC 12.1 (H) 3.6 - 11.0 K/uL   RBC 2.77 (L) 3.80 - 5.20 MIL/uL   Hemoglobin 7.6 (L) 12.0 - 16.0 g/dL   HCT 24.1 (L) 35.0 - 47.0 %   MCV 87.1 80.0 - 100.0  fL   MCH 27.5 26.0 - 34.0 pg   MCHC 31.6 (L) 32.0 - 36.0 g/dL   RDW 15.9 (H) 11.5 - 14.5 %   Platelets 217 150 -  440 K/uL   No results found for this or any previous visit (from the past 240 hour(s)).  Renal Function:  Recent Labs  01/20/16 1303  CREATININE 4.89*   Estimated Creatinine Clearance: 11.6 mL/min (by C-G formula based on Cr of 4.89).  Radiologic Imaging: Ct Renal Stone Study  01/20/2016  CLINICAL DATA:  Left flank pain today.  No known injury. EXAM: CT ABDOMEN AND PELVIS WITHOUT CONTRAST TECHNIQUE: Multidetector CT imaging of the abdomen and pelvis was performed following the standard protocol without IV contrast. COMPARISON:  CT abdomen and pelvis 10/03/2014. FINDINGS: There is cardiomegaly. Calcific aortic and coronary atherosclerosis is identified. No pleural or pericardial effusion. Mild dependent atelectasis is noted. There is mild to moderate left hydronephrosis on the left with dilatation of the left ureter identified. There is abnormal soft tissue attenuation at the left ureterovesical junction and along the left side of the urinary bladder worrisome for carcinoma. No urinary tract stones are identified on the right or left. Exophytic cyst off the left kidney measuring 5.7 cm in diameter is unchanged. The liver, gallbladder, adrenal glands, spleen and pancreas are unremarkable. No lymphadenopathy or fluid is seen in the abdomen or pelvis. Extensive aortoiliac atherosclerosis without aneurysm is identified. The patient is status post hysterectomy. Scattered colonic diverticula are seen but there is no evidence diverticulitis. The colon is otherwise normal appearance. The stomach, small bowel and appendix appear normal. No lytic or sclerotic bony lesion is seen. Mild appearing lower lumbar spondylosis is noted. IMPRESSION: Mild to moderate left hydroureteronephrosis due to a soft tissue mass at the left UVJ worrisome for carcinoma. Soft tissue attenuation worrisome for mass is  also seen along the left side of the urinary bladder. There are no urinary tract stones. Extensive calcific aortic and coronary atherosclerosis. Diverticulosis without diverticulitis. Electronically Signed   By: Inge Rise M.D.   On: 01/20/2016 15:08    I independently reviewed the above imaging studies.  Impression/Assessment: 1. Left hydronephrosis with worsening renal insufficiency. This hydronephrosis seems to be due to a posterior bladder mass.   2. Left pelvic/posterior bladder mass. Etiologies could include urothelial primaries from the bladder or ureter, or gynecologic malignancy. In reviewing her old CT scans, there is some evidence that there was a mild abnormality there on her CT from 2016.  3. Exophytic, simple left renal cyst, unchanged/no further evaluation necessary  Plan: Urgently, the proper maneuver at this point would be to perform cystoscopy and placement of left ureteral stent. If there is significant abnormality within the bladder lumen, biopsy would be indicated  I have discussed this with the patient and her family members. I've also scheduled this to be performed in the midmorning tomorrow  The patient can eat, but must be nothing by mouth after midnight

## 2016-01-20 NOTE — ED Notes (Signed)
Bladder scan 675m, EDP notified

## 2016-01-20 NOTE — ED Notes (Signed)
Admitting md in with pt now.   Iv infusing.

## 2016-01-20 NOTE — ED Notes (Addendum)
Resumed care from grace rn.  Pt up to br with assistance.  Pt voided approx 450 cc urine   md aware.  ua to lab.  Pt alert.  Family with pt.

## 2016-01-20 NOTE — H&P (Addendum)
[facility] [place] NAME: Barbara [name]    MR#:  [id number]  DATE OF BIRTH:  09-18-1940  DATE OF ADMISSION:  01/20/2016  PRIMARY CARE PHYSICIAN: Madelyn Brunner, MD   REQUESTING/REFERRING PHYSICIAN:   CHIEF COMPLAINT:   Chief Complaint  Patient presents with  . Hyperglycemia    HISTORY OF PRESENT ILLNESS: Barbara [name]  is a 75 y.o. female with a known history of recent admission to the hospital May 30 through June 5 for Escherichia coli sepsis due to UTI, CK D stage 4, COPD, hypertension, diabetes mellitus without complication, arthritis, who presents from the skilled nursing facility/ability to a facility with feeling of cold/chilly, generous weakness, left sided abdominal and flank pain, weight loss from 240-222 pounds over the past one months while in the rehabilitation facility. On arrival to the hospital, she was noted to be in acute on chronic renal failure with creatinine level going up from 2.26 on June 5 to 4.89 today. Hospitalist services were contacted for admission. Patient's labs also revealed mild pyuria, leukocytosis, anemia. Radiology studies revealed left hydroureteronephrosis and a soft mass at the UVJ , concerning for cancer.   PAST MEDICAL HISTORY:   Past Medical History  Diagnosis Date  . COPD (chronic obstructive pulmonary disease) ([place])   . Hypertension   . Diabetes mellitus without complication ([place])   . Arthritis     PAST SURGICAL HISTORY: Past Surgical History  Procedure Laterality Date  . Abdominal hysterectomy    . Rotator cuff repair      left    SOCIAL HISTORY:  Social History  Substance Use Topics  . Smoking status: Former Research scientist (life sciences)  . Smokeless tobacco: Not on file  . Alcohol Use: No    FAMILY HISTORY:  Family History  Problem Relation Age of Onset  . Breast cancer Neg Hx     DRUG ALLERGIES:  Allergies  Allergen Reactions  . Percocet [Oxycodone-Acetaminophen] Hives    Review of  Systems  Constitutional: Positive for chills, weight loss and malaise/fatigue. Negative for fever.  HENT: Negative for congestion.   Eyes: Negative for blurred vision and double vision.  Respiratory: Positive for cough, shortness of breath and wheezing. Negative for sputum production.   Cardiovascular: Positive for palpitations. Negative for chest pain, orthopnea, leg swelling and PND.  Gastrointestinal: Negative for nausea, vomiting, abdominal pain, diarrhea, constipation, blood in stool and melena.  Genitourinary: Negative for dysuria, urgency, frequency and hematuria.  Musculoskeletal: Negative for falls.  Skin: Negative for rash.  Neurological: Positive for weakness. Negative for dizziness.  Psychiatric/Behavioral: Negative for depression and memory loss. The patient is not nervous/anxious.     MEDICATIONS AT HOME:  Prior to Admission medications   Medication Sig Start Date End Date Taking? Authorizing Provider  aspirin EC 81 MG tablet Take 81 mg by mouth daily.   Yes Historical Provider, MD  Albuterol Sulfate (PROAIR RESPICLICK) 132 (90 Base) MCG/ACT AEPB Inhale 2 puffs into the lungs daily as needed (wheezing).    Historical Provider, MD  amLODipine (NORVASC) 10 MG tablet Take 10 mg by mouth daily.    Historical Provider, MD  atorvastatin (LIPITOR) 40 MG tablet Take 40 mg by mouth daily.    Historical Provider, MD  ciprofloxacin (CIPRO) 250 MG tablet Take 1 tablet (250 mg total) by mouth 2 (two) times daily. 12/23/15   Lytle But[place], MD  esomeprazole (NEXIUM) 40 MG capsule Take 40 mg by mouth daily at 12 noon.  Historical Provider, MD  gabapentin (NEURONTIN) 300 MG capsule Take 600 mg by mouth 3 (three) times daily.    Historical Provider, MD  glipiZIDE (GLUCOTROL XL) 5 MG 24 hr tablet Take 5 mg by mouth daily with breakfast.    Historical Provider, MD  insulin glargine (LANTUS) 100 UNIT/ML injection Inject 36-38 Units into the skin 2 (two) times daily.    Historical Provider, MD   lidocaine (XYLOCAINE) 5 % ointment Apply 1 application topically as needed.    Historical Provider, MD  linagliptin (TRADJENTA) 5 MG TABS tablet Take 5 mg by mouth daily.    Historical Provider, MD  losartan-hydrochlorothiazide (HYZAAR) 100-25 MG tablet Take 1 tablet by mouth daily.    Historical Provider, MD  Travoprost, BAK Free, (TRAVATAN) 0.004 % SOLN ophthalmic solution Place 1 drop into both eyes at bedtime.    Historical Provider, MD      PHYSICAL EXAMINATION:   VITAL SIGNS: Blood pressure 155/55, pulse 86, temperature 99.8 F (37.7 C), temperature source Oral, resp. rate 26, height 5' 8"  (1.727 m), weight 91.627 kg (202 lb), SpO2 93 %.  GENERAL:  75 y.o.-year-old patient lying in the bed In mild distress due to pain in the abdomen/flank on the left, and comfortable on the stretcher.  EYES: Pupils equal, round, reactive to light and accommodation. No scleral icterus. Extraocular muscles intact.  HEENT: Head atraumatic, normocephalic. Oropharynx and nasopharynx clear.  NECK:  Supple, no jugular venous distention. No thyroid enlargement, no tenderness.  LUNGS: Normal breath sounds bilaterally, no wheezing, rales,rhonchi or crepitation. No use of accessory muscles of respiration.  CARDIOVASCULAR: S1, S2 . Rhythm was regular 2/6 systolic murmur was heard radiating to left axilla. No rubs, or gallops.  ABDOMEN: Soft, tender on the left side of abdomen, especially left upper quadrant but no rebound or guarding was noted, plus CVA tenderness on percussion on the left, nondistended. Bowel sounds present. No organomegaly or mass.  EXTREMITIES: Trace pedal edema, no cyanosis, or clubbing.  NEUROLOGIC: Cranial nerves II through XII are intact. Muscle strength 5/5 in all extremities. Sensation intact. Gait not checked.  PSYCHIATRIC: The patient is alert and oriented x 3.  SKIN: No obvious rash, lesion, or ulcer.   LABORATORY PANEL:   CBC  Recent Labs Lab 01/20/16 1303  WBC 11.7*  HGB  7.8*  HCT 24.0*  PLT 237  MCV 84.2  MCH 27.4  MCHC 32.6  RDW 16.1*   ------------------------------------------------------------------------------------------------------------------  Chemistries   Recent Labs Lab 01/20/16 1303  NA 138  K 4.2  CL 107  CO2 19*  GLUCOSE 84  BUN 60*  CREATININE 4.89*  CALCIUM 8.6*  AST 17  ALT 13*  ALKPHOS 70  BILITOT 0.3   ------------------------------------------------------------------------------------------------------------------  Cardiac Enzymes No results for input(s): TROPONINI in the last 168 hours. ------------------------------------------------------------------------------------------------------------------  RADIOLOGY: Ct Renal Stone Study  01/20/2016  CLINICAL DATA:  Left flank pain today.  No known injury. EXAM: CT ABDOMEN AND PELVIS WITHOUT CONTRAST TECHNIQUE: Multidetector CT imaging of the abdomen and pelvis was performed following the standard protocol without IV contrast. COMPARISON:  CT abdomen and pelvis 10/03/2014. FINDINGS: There is cardiomegaly. Calcific aortic and coronary atherosclerosis is identified. No pleural or pericardial effusion. Mild dependent atelectasis is noted. There is mild to moderate left hydronephrosis on the left with dilatation of the left ureter identified. There is abnormal soft tissue attenuation at the left ureterovesical junction and along the left side of the urinary bladder worrisome for carcinoma. No urinary tract stones are identified  on the right or left. Exophytic cyst off the left kidney measuring 5.7 cm in diameter is unchanged. The liver, gallbladder, adrenal glands, spleen and pancreas are unremarkable. No lymphadenopathy or fluid is seen in the abdomen or pelvis. Extensive aortoiliac atherosclerosis without aneurysm is identified. The patient is status post hysterectomy. Scattered colonic diverticula are seen but there is no evidence diverticulitis. The colon is otherwise normal  appearance. The stomach, small bowel and appendix appear normal. No lytic or sclerotic bony lesion is seen. Mild appearing lower lumbar spondylosis is noted. IMPRESSION: Mild to moderate left hydroureteronephrosis due to a soft tissue mass at the left UVJ worrisome for carcinoma. Soft tissue attenuation worrisome for mass is also seen along the left side of the urinary bladder. There are no urinary tract stones. Extensive calcific aortic and coronary atherosclerosis. Diverticulosis without diverticulitis. Electronically Signed   By: Inge Rise M.D.   On: 01/20/2016 15:08    EKG: Orders placed or performed during the hospital encounter of 01/20/16  . EKG 12-Lead  . EKG 12-Lead   EKG in the emergency room reveals sinus rhythm at 90 bpm, normal axis, no acute ST-T changes  IMPRESSION AND PLAN:  Principal Problem:   Acute on chronic renal failure (HCC) Active Problems:   Ureterovesical junction obstruction   UTI (lower urinary tract infection)   Hydroureteronephrosis   Leukocytosis   Anemia #1. Acute on chronic renal failure due to obstruction at UVJ with mass, concerning for cancer, patient needs a left nephrostomy tube immediately due to significant a kidney injury, attempting to reach interventional radiologist for nephrostomy tube placement versus transferring patient to [facility]. Discussed with interventional radiologist, who referred me to Dr. Laverle Hobby, awaiting for his call #2 ureterovesical junction obstruction with mass of unclear etiology, urology consultation will be needed #3. Urinary tract infection, get urinary cultures, initiate patient on the Rocephin intravenously #4. Leukocytosis, follow with antibiotic therapy #5. Anemia, get Hemoccult #6. Weight  Loss Of unclear etiology, follow patient's oral intake, get dietitian involved for recommendations   All the records are reviewed and case discussed with ED provider. Management plans discussed with the patient, family and  they are in agreement.  CODE STATUS: Code Status History    Date Active Date Inactive Code Status Order ID Comments User Context   12/18/2015  2:14 AM 12/23/2015  4:51 PM Full Code [id number]  Saundra Shelling, MD Inpatient     PS. Discussed with interventional radiology, Dr. Laverle Hobby, recommended not to attempt left nephrostomy tube unless patient is septic, urologist is paged.   TOTAL TIME TAKING CARE OF THIS PATIENT: 60  minutes.    Theodoro Grist M.D on 01/20/2016 at 4:30 PM  Between 7am to 6pm - Pager - [phone] After 6pm go to [email or web address] - password [email or web address]  Office  [phone]  CC: Primary care physician; Madelyn Brunner, MD

## 2016-01-21 ENCOUNTER — Encounter (HOSPITAL_COMMUNITY): Payer: Self-pay | Admitting: Family Medicine

## 2016-01-21 ENCOUNTER — Inpatient Hospital Stay: Payer: Medicare Other | Admitting: Anesthesiology

## 2016-01-21 ENCOUNTER — Inpatient Hospital Stay (HOSPITAL_COMMUNITY): Payer: Medicare Other

## 2016-01-21 ENCOUNTER — Inpatient Hospital Stay (HOSPITAL_COMMUNITY)
Admission: AD | Admit: 2016-01-21 | Discharge: 2016-01-27 | DRG: 872 | Disposition: A | Discharge status: Skilled Nursing Facility | Payer: Medicare Other | Source: Other Acute Inpatient Hospital | Attending: Internal Medicine | Admitting: Internal Medicine

## 2016-01-21 ENCOUNTER — Encounter
Admission: EM | Disposition: A | Discharge status: Other Acute Inpatient Hospital | Payer: Self-pay | Source: Home / Self Care | Attending: Internal Medicine

## 2016-01-21 DIAGNOSIS — E1122 Type 2 diabetes mellitus with diabetic chronic kidney disease: Secondary | ICD-10-CM | POA: Diagnosis not present

## 2016-01-21 DIAGNOSIS — N184 Chronic kidney disease, stage 4 (severe): Secondary | ICD-10-CM | POA: Diagnosis present

## 2016-01-21 DIAGNOSIS — N139 Obstructive and reflux uropathy, unspecified: Secondary | ICD-10-CM | POA: Diagnosis present

## 2016-01-21 DIAGNOSIS — I10 Essential (primary) hypertension: Secondary | ICD-10-CM | POA: Diagnosis not present

## 2016-01-21 DIAGNOSIS — D509 Iron deficiency anemia, unspecified: Secondary | ICD-10-CM | POA: Diagnosis not present

## 2016-01-21 DIAGNOSIS — N179 Acute kidney failure, unspecified: Secondary | ICD-10-CM | POA: Diagnosis present

## 2016-01-21 DIAGNOSIS — I129 Hypertensive chronic kidney disease with stage 1 through stage 4 chronic kidney disease, or unspecified chronic kidney disease: Secondary | ICD-10-CM | POA: Diagnosis present

## 2016-01-21 DIAGNOSIS — C689 Malignant neoplasm of urinary organ, unspecified: Secondary | ICD-10-CM | POA: Diagnosis present

## 2016-01-21 DIAGNOSIS — N131 Hydronephrosis with ureteral stricture, not elsewhere classified: Secondary | ICD-10-CM | POA: Diagnosis not present

## 2016-01-21 DIAGNOSIS — A047 Enterocolitis due to Clostridium difficile: Secondary | ICD-10-CM | POA: Diagnosis present

## 2016-01-21 DIAGNOSIS — A419 Sepsis, unspecified organism: Secondary | ICD-10-CM | POA: Diagnosis present

## 2016-01-21 DIAGNOSIS — E876 Hypokalemia: Secondary | ICD-10-CM | POA: Diagnosis not present

## 2016-01-21 DIAGNOSIS — Z794 Long term (current) use of insulin: Secondary | ICD-10-CM

## 2016-01-21 DIAGNOSIS — D631 Anemia in chronic kidney disease: Secondary | ICD-10-CM | POA: Diagnosis present

## 2016-01-21 DIAGNOSIS — Z79899 Other long term (current) drug therapy: Secondary | ICD-10-CM | POA: Diagnosis not present

## 2016-01-21 DIAGNOSIS — G9340 Encephalopathy, unspecified: Secondary | ICD-10-CM | POA: Diagnosis not present

## 2016-01-21 DIAGNOSIS — N1339 Other hydronephrosis: Secondary | ICD-10-CM

## 2016-01-21 DIAGNOSIS — J449 Chronic obstructive pulmonary disease, unspecified: Secondary | ICD-10-CM | POA: Diagnosis present

## 2016-01-21 DIAGNOSIS — N1330 Unspecified hydronephrosis: Secondary | ICD-10-CM | POA: Diagnosis not present

## 2016-01-21 DIAGNOSIS — Z87891 Personal history of nicotine dependence: Secondary | ICD-10-CM

## 2016-01-21 DIAGNOSIS — N136 Pyonephrosis: Secondary | ICD-10-CM | POA: Diagnosis not present

## 2016-01-21 DIAGNOSIS — N390 Urinary tract infection, site not specified: Secondary | ICD-10-CM | POA: Diagnosis not present

## 2016-01-21 DIAGNOSIS — D494 Neoplasm of unspecified behavior of bladder: Secondary | ICD-10-CM

## 2016-01-21 DIAGNOSIS — E11649 Type 2 diabetes mellitus with hypoglycemia without coma: Secondary | ICD-10-CM | POA: Diagnosis present

## 2016-01-21 DIAGNOSIS — R509 Fever, unspecified: Secondary | ICD-10-CM | POA: Diagnosis present

## 2016-01-21 DIAGNOSIS — Z7982 Long term (current) use of aspirin: Secondary | ICD-10-CM | POA: Diagnosis not present

## 2016-01-21 DIAGNOSIS — N189 Chronic kidney disease, unspecified: Secondary | ICD-10-CM

## 2016-01-21 DIAGNOSIS — A0472 Enterocolitis due to Clostridium difficile, not specified as recurrent: Secondary | ICD-10-CM | POA: Diagnosis present

## 2016-01-21 DIAGNOSIS — Z885 Allergy status to narcotic agent status: Secondary | ICD-10-CM

## 2016-01-21 HISTORY — PX: KIDNEY SURGERY: SHX687

## 2016-01-21 HISTORY — DX: Sleep apnea, unspecified: G47.30

## 2016-01-21 HISTORY — DX: Anemia in chronic kidney disease: D63.1

## 2016-01-21 HISTORY — PX: CYSTOSCOPY WITH STENT PLACEMENT: SHX5790

## 2016-01-21 HISTORY — DX: Obstructive and reflux uropathy, unspecified: N13.9

## 2016-01-21 HISTORY — DX: Chronic kidney disease, unspecified: N18.9

## 2016-01-21 LAB — GLUCOSE, CAPILLARY
GLUCOSE-CAPILLARY: 79 mg/dL (ref 65–99)
GLUCOSE-CAPILLARY: 57 mg/dL — AB (ref 65–99)
GLUCOSE-CAPILLARY: 52 mg/dL — AB (ref 65–99)
GLUCOSE-CAPILLARY: 63 mg/dL — AB (ref 65–99)
GLUCOSE-CAPILLARY: 116 mg/dL — AB (ref 65–99)
GLUCOSE-CAPILLARY: 132 mg/dL — AB (ref 65–99)
GLUCOSE-CAPILLARY: 135 mg/dL — AB (ref 65–99)
Glucose-Capillary: 115 mg/dL — ABNORMAL HIGH (ref 65–99)
Glucose-Capillary: 151 mg/dL — ABNORMAL HIGH (ref 65–99)
Glucose-Capillary: 51 mg/dL — ABNORMAL LOW (ref 65–99)
Glucose-Capillary: 52 mg/dL — ABNORMAL LOW (ref 65–99)
Glucose-Capillary: 55 mg/dL — ABNORMAL LOW (ref 65–99)
Glucose-Capillary: 58 mg/dL — ABNORMAL LOW (ref 65–99)
Glucose-Capillary: 70 mg/dL (ref 65–99)
Glucose-Capillary: 71 mg/dL (ref 65–99)
Glucose-Capillary: 76 mg/dL (ref 65–99)

## 2016-01-21 LAB — CBC
HCT: 24.7 % — ABNORMAL LOW (ref 35.0–47.0)
Hemoglobin: 8.1 g/dL — ABNORMAL LOW (ref 12.0–16.0)
MCH: 28.3 pg (ref 26.0–34.0)
MCHC: 32.8 g/dL (ref 32.0–36.0)
MCV: 86.4 fL (ref 80.0–100.0)
Platelets: 227 K/uL (ref 150–440)
RBC: 2.86 MIL/uL — ABNORMAL LOW (ref 3.80–5.20)
RDW: 15.8 % — ABNORMAL HIGH (ref 11.5–14.5)
WBC: 12.1 K/uL — ABNORMAL HIGH (ref 3.6–11.0)

## 2016-01-21 LAB — BASIC METABOLIC PANEL WITH GFR
Anion gap: 10 (ref 5–15)
BUN: 53 mg/dL — ABNORMAL HIGH (ref 6–20)
CO2: 20 mmol/L — ABNORMAL LOW (ref 22–32)
Calcium: 8.3 mg/dL — ABNORMAL LOW (ref 8.9–10.3)
Chloride: 109 mmol/L (ref 101–111)
Creatinine, Ser: 4.34 mg/dL — ABNORMAL HIGH (ref 0.44–1.00)
GFR calc Af Amer: 10 mL/min — ABNORMAL LOW
GFR calc non Af Amer: 9 mL/min — ABNORMAL LOW
Glucose, Bld: 47 mg/dL — ABNORMAL LOW (ref 65–99)
Potassium: 3.4 mmol/L — ABNORMAL LOW (ref 3.5–5.1)
Sodium: 139 mmol/L (ref 135–145)

## 2016-01-21 LAB — LACTIC ACID, PLASMA: Lactic Acid, Venous: 1.1 mmol/L (ref 0.5–1.9)

## 2016-01-21 LAB — HEMOGLOBIN A1C: Hgb A1c MFr Bld: 6.5 % — ABNORMAL HIGH (ref 4.0–6.0)

## 2016-01-21 LAB — SURGICAL PCR SCREEN
MRSA, PCR: NEGATIVE
Staphylococcus aureus: NEGATIVE

## 2016-01-21 LAB — OCCULT BLOOD X 1 CARD TO LAB, STOOL: Fecal Occult Bld: NEGATIVE

## 2016-01-21 SURGERY — CYSTOSCOPY, WITH STENT INSERTION
Anesthesia: General | Site: Ureter | Laterality: Left | Wound class: Clean Contaminated

## 2016-01-21 MED ORDER — CEFEPIME HCL 2 G IJ SOLR: 2.0000 g | Freq: Once | INTRAMUSCULAR | Status: DC

## 2016-01-21 MED ORDER — DEXTROSE 50 % IV SOLN
INTRAVENOUS | Status: AC
  Administered 2016-01-21: 25 mL
  Filled 2016-01-21: qty 50

## 2016-01-21 MED ORDER — DEXTROSE 5 % IV SOLN: 1.0000 g | INTRAVENOUS | Status: DC

## 2016-01-21 MED ORDER — FENTANYL CITRATE (PF) 100 MCG/2ML IJ SOLN
INTRAMUSCULAR | Status: AC | PRN
  Administered 2016-01-21: 25 ug via INTRAVENOUS

## 2016-01-21 MED ORDER — ACETAMINOPHEN 325 MG PO TABS
650.0000 mg | ORAL_TABLET | Freq: Four times a day (QID) | ORAL | Status: DC | PRN
  Administered 2016-01-22 – 2016-01-26 (×4): 650 mg via ORAL
  Filled 2016-01-21 (×5): qty 2

## 2016-01-21 MED ORDER — HEPARIN SODIUM (PORCINE) 5000 UNIT/ML IJ SOLN: 5000.0000 [IU] | Freq: Three times a day (TID) | INTRAMUSCULAR | Status: DC

## 2016-01-21 MED ORDER — FENTANYL CITRATE (PF) 100 MCG/2ML IJ SOLN
INTRAMUSCULAR | Status: AC
  Filled 2016-01-21: qty 2

## 2016-01-21 MED ORDER — PROPOFOL 500 MG/50ML IV EMUL: INTRAVENOUS | Status: DC | PRN

## 2016-01-21 MED ORDER — CEFTRIAXONE SODIUM 1 G IJ SOLR
1.0000 g | INTRAMUSCULAR | Status: DC
  Administered 2016-01-21: 1 g via INTRAVENOUS
  Filled 2016-01-21: qty 10

## 2016-01-21 MED ORDER — ONDANSETRON HCL 4 MG/2ML IJ SOLN: 4.0000 mg | Freq: Four times a day (QID) | INTRAMUSCULAR | Status: DC | PRN

## 2016-01-21 MED ORDER — IPRATROPIUM-ALBUTEROL 0.5-2.5 (3) MG/3ML IN SOLN
3.0000 mL | Freq: Once | RESPIRATORY_TRACT | Status: AC
  Administered 2016-01-21: 3 mL via RESPIRATORY_TRACT

## 2016-01-21 MED ORDER — FENTANYL CITRATE (PF) 100 MCG/2ML IJ SOLN
INTRAMUSCULAR | Status: DC | PRN
  Administered 2016-01-21 (×2): 25 ug via INTRAVENOUS

## 2016-01-21 MED ORDER — INSULIN ASPART 100 UNIT/ML ~~LOC~~ SOLN: 0.0000 [IU] | Freq: Every day | SUBCUTANEOUS | Status: DC

## 2016-01-21 MED ORDER — INSULIN ASPART 100 UNIT/ML ~~LOC~~ SOLN
0.0000 [IU] | Freq: Three times a day (TID) | SUBCUTANEOUS | Status: DC
  Administered 2016-01-22 (×2): 3 [IU] via SUBCUTANEOUS
  Administered 2016-01-22 – 2016-01-26 (×11): 2 [IU] via SUBCUTANEOUS
  Administered 2016-01-26: 3 [IU] via SUBCUTANEOUS
  Administered 2016-01-27 (×2): 2 [IU] via SUBCUTANEOUS

## 2016-01-21 MED ORDER — DEXTROSE 50 % IV SOLN
50.0000 mL | Freq: Once | INTRAVENOUS | Status: AC
  Administered 2016-01-21: 50 mL via INTRAVENOUS

## 2016-01-21 MED ORDER — HYDRALAZINE HCL 20 MG/ML IJ SOLN
10.0000 mg | Freq: Four times a day (QID) | INTRAMUSCULAR | Status: DC | PRN
  Filled 2016-01-21: qty 1

## 2016-01-21 MED ORDER — FENTANYL CITRATE (PF) 100 MCG/2ML IJ SOLN: 25.0000 ug | INTRAMUSCULAR | Status: DC | PRN

## 2016-01-21 MED ORDER — DEXTROSE 50 % IV SOLN
25.0000 mL | Freq: Once | INTRAVENOUS | Status: AC
  Administered 2016-01-21: 25 mL via INTRAVENOUS

## 2016-01-21 MED ORDER — PROPOFOL 10 MG/ML IV BOLUS
INTRAVENOUS | Status: DC | PRN
  Administered 2016-01-21: 50 mg via INTRAVENOUS
  Administered 2016-01-21: 20 mg via INTRAVENOUS

## 2016-01-21 MED ORDER — ACETAMINOPHEN 650 MG RE SUPP: 650.0000 mg | Freq: Four times a day (QID) | RECTAL | Status: DC | PRN

## 2016-01-21 MED ORDER — LIDOCAINE HCL 1 % IJ SOLN
INTRAMUSCULAR | Status: AC
  Filled 2016-01-21: qty 20

## 2016-01-21 MED ORDER — DEXTROSE 5 % IV SOLN: 1.0000 g | Freq: Once | INTRAVENOUS | Status: DC

## 2016-01-21 MED ORDER — DEXTROSE 10 % IV SOLN
INTRAVENOUS | Status: DC
  Administered 2016-01-21 (×2): via INTRAVENOUS

## 2016-01-21 MED ORDER — ATORVASTATIN CALCIUM 40 MG PO TABS
40.0000 mg | ORAL_TABLET | Freq: Every day | ORAL | Status: DC
Start: 2016-01-21 — End: 2016-01-27
  Administered 2016-01-21 – 2016-01-27 (×7): 40 mg via ORAL
  Filled 2016-01-21 (×7): qty 1

## 2016-01-21 MED ORDER — SODIUM CHLORIDE 0.9 % IV BOLUS (SEPSIS)
1000.0000 mL | Freq: Once | INTRAVENOUS | Status: AC
  Administered 2016-01-21: 1000 mL via INTRAVENOUS

## 2016-01-21 MED ORDER — DEXTROSE 50 % IV SOLN
INTRAVENOUS | Status: DC
Start: 2016-01-21 — End: 2016-01-21
  Filled 2016-01-21: qty 50

## 2016-01-21 MED ORDER — DEXTROSE 10 % IV SOLN: 75.0000 mL/h | INTRAVENOUS | Status: DC

## 2016-01-21 MED ORDER — SODIUM CHLORIDE 0.9 % IV BOLUS (SEPSIS)
2000.0000 mL | Freq: Once | INTRAVENOUS | Status: AC
  Administered 2016-01-21: 2000 mL via INTRAVENOUS

## 2016-01-21 MED ORDER — ASPIRIN EC 81 MG PO TBEC
81.0000 mg | DELAYED_RELEASE_TABLET | Freq: Every day | ORAL | Status: DC
  Administered 2016-01-21 – 2016-01-27 (×7): 81 mg via ORAL
  Filled 2016-01-21 (×7): qty 1

## 2016-01-21 MED ORDER — PROPOFOL 500 MG/50ML IV EMUL
INTRAVENOUS | Status: DC | PRN
  Administered 2016-01-21: 100 ug/kg/min via INTRAVENOUS

## 2016-01-21 MED ORDER — LIDOCAINE HCL 1 % IJ SOLN
INTRAMUSCULAR | Status: AC | PRN
  Administered 2016-01-21: 10 mL

## 2016-01-21 MED ORDER — ALBUTEROL SULFATE (2.5 MG/3ML) 0.083% IN NEBU: 2.5000 mg | INHALATION_SOLUTION | RESPIRATORY_TRACT | Status: DC | PRN

## 2016-01-21 MED ORDER — LABETALOL HCL 5 MG/ML IV SOLN
10.0000 mg | Freq: Three times a day (TID) | INTRAVENOUS | Status: DC | PRN
  Filled 2016-01-21: qty 4

## 2016-01-21 MED ORDER — MIDAZOLAM HCL 2 MG/2ML IJ SOLN
INTRAMUSCULAR | Status: DC | PRN
  Administered 2016-01-21 (×2): 1 mg via INTRAVENOUS

## 2016-01-21 MED ORDER — ONDANSETRON HCL 4 MG/2ML IJ SOLN: 4.0000 mg | Freq: Once | INTRAMUSCULAR | Status: DC | PRN

## 2016-01-21 MED ORDER — DEXTROSE 5 % IV SOLN
1.0000 g | INTRAVENOUS | Status: DC
  Administered 2016-01-21 – 2016-01-23 (×3): 1 g via INTRAVENOUS
  Filled 2016-01-21 (×4): qty 1

## 2016-01-21 MED ORDER — POTASSIUM CHLORIDE IN NACL 20-0.9 MEQ/L-% IV SOLN
INTRAVENOUS | Status: DC
  Administered 2016-01-22 – 2016-01-23 (×4): via INTRAVENOUS
  Filled 2016-01-21 (×4): qty 1000

## 2016-01-21 MED ORDER — MIDAZOLAM HCL 2 MG/2ML IJ SOLN
INTRAMUSCULAR | Status: AC | PRN
  Administered 2016-01-21: 0.5 mg via INTRAVENOUS

## 2016-01-21 MED ORDER — ONDANSETRON HCL 4 MG PO TABS: 4.0000 mg | ORAL_TABLET | Freq: Four times a day (QID) | ORAL | Status: DC | PRN

## 2016-01-21 MED ORDER — MIDAZOLAM HCL 2 MG/2ML IJ SOLN
INTRAMUSCULAR | Status: AC
  Filled 2016-01-21: qty 4

## 2016-01-21 MED ORDER — DEXTROSE-NACL 5-0.45 % IV SOLN
INTRAVENOUS | Status: DC
  Administered 2016-01-21: 02:00:00 via INTRAVENOUS

## 2016-01-21 MED ORDER — DEXTROSE 50 % IV SOLN: 1.0000 | Freq: Once | INTRAVENOUS | Status: DC

## 2016-01-21 MED ORDER — IOPAMIDOL (ISOVUE-300) INJECTION 61%
INTRAVENOUS | Status: AC
  Administered 2016-01-21: 15 mL
  Filled 2016-01-21: qty 50

## 2016-01-21 MED ORDER — SODIUM CHLORIDE 0.9 % IV SOLN
INTRAVENOUS | Status: DC | PRN
  Administered 2016-01-21: 10:00:00 via INTRAVENOUS

## 2016-01-21 SURGICAL SUPPLY — 23 items
ALL SILLICONE FOLEY 18X30 ×3 IMPLANT
BAG DRAIN CYSTO-URO LG1000N (MISCELLANEOUS) ×3 IMPLANT
CATH URETL 5X70 OPEN END (CATHETERS) ×3 IMPLANT
CATH URETL OPEN END 6X70 (CATHETERS) ×3 IMPLANT
CONRAY 43 FOR UROLOGY 50M (MISCELLANEOUS) ×3 IMPLANT
GLOVE BIO SURGEON STRL SZ8 (GLOVE) ×6 IMPLANT
GOWN STRL REUS W/ TWL LRG LVL4 (GOWN DISPOSABLE) ×1 IMPLANT
GOWN STRL REUS W/ TWL XL LVL3 (GOWN DISPOSABLE) ×1 IMPLANT
GOWN STRL REUS W/TWL LRG LVL4 (GOWN DISPOSABLE) ×2
GOWN STRL REUS W/TWL XL LVL3 (GOWN DISPOSABLE) ×2
KIT RM TURNOVER CYSTO AR (KITS) ×3 IMPLANT
PACK CYSTO AR (MISCELLANEOUS) ×3 IMPLANT
PREP PVP WINGED SPONGE (MISCELLANEOUS) ×3 IMPLANT
SENSORWIRE 0.038 NOT ANGLED (WIRE) ×3
SET CYSTO W/LG BORE CLAMP LF (SET/KITS/TRAYS/PACK) ×3 IMPLANT
SOL .9 NS 3000ML IRR  AL (IV SOLUTION) ×2
SOL .9 NS 3000ML IRR UROMATIC (IV SOLUTION) ×1 IMPLANT
STENT URET 6FRX24 CONTOUR (STENTS) IMPLANT
STENT URET 6FRX26 CONTOUR (STENTS) IMPLANT
SURGILUBE 2OZ TUBE FLIPTOP (MISCELLANEOUS) ×3 IMPLANT
SYRINGE IRR TOOMEY STRL 70CC (SYRINGE) ×3 IMPLANT
WATER STERILE IRR 1000ML POUR (IV SOLUTION) ×3 IMPLANT
WIRE SENSOR 0.038 NOT ANGLED (WIRE) ×1 IMPLANT

## 2016-01-21 NOTE — Progress Notes (Signed)
[facility]  ROUNDING NOTE   Subjective:  Patient well known to Barbara [name]. We follow her for chronic kidney disease stage IV with most recent baseline creatinine of 2.0. Now percent with acute renal failure and left-sided flank pain. Soft tissue mass noted at the left UVJ worrisome for carcinoma. Stent placement was attempted today but unable to be performed. Case discussed with urology. Patient will need nephrostomy placement.   Objective:  Vital signs in last 24 hours:  Temp:  [97.5 F (36.4 C)-101.4 F (38.6 C)] 99.1 F (37.3 C) (07/04 1236) Pulse Rate:  [86-102] 96 (07/04 1236) Resp:  [15-27] 18 (07/04 1236) BP: (100-166)/(42-62) 166/52 mmHg (07/04 1236) SpO2:  [93 %-98 %] 95 % (07/04 1236) Weight:  [91.627 kg (202 lb)] 91.627 kg (202 lb) (07/03 1315)  Weight change:  Filed Weights   01/20/16 1315  Weight: 91.627 kg (202 lb)    Intake/Output: I/O last 3 completed shifts: In: 1482.8 [P.O.:480; I.V.:1002.8] Out: 2119 [Urine:1650]   Intake/Output this shift:  Total I/O In: 513.8 [P.O.:120; I.V.:393.8] Out: 420 [Urine:420]  Physical Exam: General: Sleepy but arousable  Head: Normocephalic, atraumatic. Moist oral mucosal membranes  Eyes: Anicteric  Neck: Supple, trachea midline  Lungs:  Clear to auscultation, normal effort  Heart: Regular rate and rhythm  Abdomen:  Soft, nontender, BS present  Extremities: trace peripheral edema.  Neurologic: Sleepy but arousable  Skin: No lesions       Basic Metabolic Panel:  Recent Labs Lab 01/20/16 1303 01/20/16 1859 01/21/16 0434  NA 138  --  139  K 4.2  --  3.4*  CL 107  --  109  CO2 19*  --  20*  GLUCOSE 84  --  47*  BUN 60*  --  53*  CREATININE 4.89* 4.78* 4.34*  CALCIUM 8.6*  --  8.3*    Liver Function Tests:  Recent Labs Lab 01/20/16 1303  AST 17  ALT 13*  ALKPHOS 70  BILITOT 0.3  PROT 8.1  ALBUMIN 3.5    Recent Labs Lab 01/20/16 1303  LIPASE 48   No results for input(s): AMMONIA  in the last 168 hours.  CBC:  Recent Labs Lab 01/20/16 1303 01/20/16 1859 01/21/16 0434  WBC 11.7* 12.1* 12.1*  HGB 7.8* 7.6* 8.1*  HCT 24.0* 24.1* 24.7*  MCV 84.2 87.1 86.4  PLT 237 217 227    Cardiac Enzymes: No results for input(s): CKTOTAL, CKMB, CKMBINDEX, TROPONINI in the last 168 hours.  BNP: Invalid input(s): POCBNP  CBG:  Recent Labs Lab 01/21/16 0729 01/21/16 0800 01/21/16 0826 01/21/16 0853 01/21/16 1140  GLUCAP 57* 55* 52* 4* 116*    Microbiology: Results for orders placed or performed during the hospital encounter of 01/20/16  CULTURE, BLOOD (ROUTINE X 2) w Reflex to ID Panel     Status: None (Preliminary result)   Collection Time: 01/20/16  6:59 PM  Result Value Ref Range Status   Specimen Description BLOOD LEFT HAND  Final   Special Requests BOTTLES DRAWN AEROBIC AND ANAEROBIC 5CC  Final   Culture NO GROWTH < 24 HOURS  Final   Report Status PENDING  Incomplete  CULTURE, BLOOD (ROUTINE X 2) w Reflex to ID Panel     Status: None (Preliminary result)   Collection Time: 01/20/16  7:10 PM  Result Value Ref Range Status   Specimen Description BLOOD RIGHT HAND  Final   Special Requests BOTTLES DRAWN AEROBIC AND ANAEROBIC 5CC  Final   Culture NO GROWTH < 24  HOURS  Final   Report Status PENDING  Incomplete  Surgical pcr screen     Status: None   Collection Time: 01/21/16  6:16 AM  Result Value Ref Range Status   MRSA, PCR NEGATIVE NEGATIVE Final   Staphylococcus aureus NEGATIVE NEGATIVE Final    Comment:        The Xpert SA Assay (FDA approved for NASAL specimens in patients over 30 years of age), is one component of a comprehensive surveillance program.  Test performance has been validated by [facility] for patients greater than or equal to 66 year old. It is not intended to diagnose infection nor to guide or monitor treatment.     Coagulation Studies: No results for input(s): LABPROT, INR in the last 72 hours.  Urinalysis:  Recent  Labs  01/20/16 1500  COLORURINE STRAW*  LABSPEC 1.009  PHURINE 5.0  GLUCOSEU NEGATIVE  HGBUR NEGATIVE  BILIRUBINUR NEGATIVE  KETONESUR NEGATIVE  PROTEINUR NEGATIVE  NITRITE NEGATIVE  LEUKOCYTESUR NEGATIVE      Imaging: Ct Renal Stone Study  01/20/2016  CLINICAL DATA:  Left flank pain today.  No known injury. EXAM: CT ABDOMEN AND PELVIS WITHOUT CONTRAST TECHNIQUE: Multidetector CT imaging of the abdomen and pelvis was performed following the standard protocol without IV contrast. COMPARISON:  CT abdomen and pelvis 10/03/2014. FINDINGS: There is cardiomegaly. Calcific aortic and coronary atherosclerosis is identified. No pleural or pericardial effusion. Mild dependent atelectasis is noted. There is mild to moderate left hydronephrosis on the left with dilatation of the left ureter identified. There is abnormal soft tissue attenuation at the left ureterovesical junction and along the left side of the urinary bladder worrisome for carcinoma. No urinary tract stones are identified on the right or left. Exophytic cyst off the left kidney measuring 5.7 cm in diameter is unchanged. The liver, gallbladder, adrenal glands, spleen and pancreas are unremarkable. No lymphadenopathy or fluid is seen in the abdomen or pelvis. Extensive aortoiliac atherosclerosis without aneurysm is identified. The patient is status post hysterectomy. Scattered colonic diverticula are seen but there is no evidence diverticulitis. The colon is otherwise normal appearance. The stomach, small bowel and appendix appear normal. No lytic or sclerotic bony lesion is seen. Mild appearing lower lumbar spondylosis is noted. IMPRESSION: Mild to moderate left hydroureteronephrosis due to a soft tissue mass at the left UVJ worrisome for carcinoma. Soft tissue attenuation worrisome for mass is also seen along the left side of the urinary bladder. There are no urinary tract stones. Extensive calcific aortic and coronary atherosclerosis.  Diverticulosis without diverticulitis. Electronically Signed   By: Inge Rise M.D.   On: 01/20/2016 15:08     Medications:   . dextrose 75 mL/hr at 01/21/16 1245   . amLODipine  10 mg Oral Daily  . antiseptic oral rinse  7 mL Mouth Rinse BID  . atorvastatin  40 mg Oral Daily  . cefTRIAXone (ROCEPHIN)  IV  1 g Intravenous Q24H  . dextrose  1 ampule Intravenous Once  . dextrose      . latanoprost  1 drop Both Eyes QHS  . pantoprazole  40 mg Oral Daily  . sodium chloride flush  3 mL Intravenous Q12H   acetaminophen **OR** acetaminophen, albuterol, HYDROcodone-acetaminophen, ondansetron **OR** ondansetron (ZOFRAN) IV  Assessment/ Plan:  75 y.o. female with long-standing hypertension, diabetes type 2, hyperlipidemia, osteoarthritis, obstructive sleep apnea, obesity, CKD stage IV, anemia of CKD, secondary hyperparathyroidism, left renal cyst.   1. Acute renal failure/chronic kidney disease stage IV. Baseline  creatinine 2.0 12/20/2015 EGFR 27. -  Patient now appears to have acute renal failure. Creatinine was 4.89 upon admission but now down to 4.3. Continue IV fluid hydration. Hydronephrosis may be playing some role.  2. Left-sided hydronephrosis. Likely secondary to soft tissue mass at the left UVJ. Bladder mass also noted. Case discussed with urology. Stent placement could not be performed. Therefore she will need nephrostomy placement which will likely occur tomorrow. Continue ceftriaxone.  3. Anemia of chronic kidney disease. Hemoglobin currently 8.1. Given bladder and soft tissue mass at the left UVJ we will avoid Epogen for now.  4. Thanks for consultation.    LOS: 1 Ezzard Ditmer 7/4/20171:08 PM

## 2016-01-21 NOTE — Transfer of Care (Signed)
Immediate Anesthesia Transfer of Care Note  Patient: Barbara [name]  Procedure(s) Performed: Procedure(s): CYSTOSCOPY WITH double J STENT PLACEMENT (Left)  Patient Location: PACU  Anesthesia Type:General  Level of Consciousness: patient cooperative and lethargic  Airway & Oxygen Therapy: Patient Spontanous Breathing and Patient connected to face mask oxygen  Post-op Assessment: Report given to RN and Post -op Vital signs reviewed and stable  Post vital signs: Reviewed and stable  Last Vitals:  Filed Vitals:   01/21/16 0700 01/21/16 1134  BP:  149/62  Pulse:  100  Temp: 38.3 C 38.6 C  Resp:  20    Last Pain:  Filed Vitals:   01/21/16 1135  PainSc: 8          Complications: No apparent anesthesia complications

## 2016-01-21 NOTE — Progress Notes (Signed)
Called Dr. Jannifer [name] regarding patient's low blood sugar.  Doctor put in appropriate orders due to NPO status.  Phoebe Sharps N  01/21/2016  2:02 AM

## 2016-01-21 NOTE — Procedures (Signed)
Interventional Radiology Procedure Note  Procedure: Placement of a left PCN.  23F pigtail placed.    Complications: None Recommendations:  - sample to lab. Follow up culture - Follow output and urine clearance.  - record urine output per shift - Do not submerge - Routine drain care  - When D/C'd, can establish a routine PCN exchange with VIR. Every 6-8 weeks is usual schedule.   Signed,  Dulcy Fanny. Earleen Newport, DO

## 2016-01-21 NOTE — Discharge Summary (Signed)
[name] [place] NAME: Barbara [name]    MR#:  [id number]  DATE OF BIRTH:  Apr 28, 1941  DATE OF ADMISSION:  01/20/2016 ADMITTING PHYSICIAN: Theodoro Grist, MD  DATE OF DISCHARGE: 01/21/2016  PRIMARY CARE PHYSICIAN: Madelyn Brunner, MD    ADMISSION DIAGNOSIS:  Hydronephrosis, unspecified hydronephrosis type [N13.30] Acute renal failure, unspecified acute renal failure type ([place]) [N17.9]  DISCHARGE DIAGNOSIS:  Principal Problem:   Acute on chronic renal failure (HCC) Active Problems:   UTI (lower urinary tract infection)   Hydroureteronephrosis   Ureterovesical junction obstruction   Leukocytosis   Anemia   SECONDARY DIAGNOSIS:   Past Medical History  Diagnosis Date  . COPD (chronic obstructive pulmonary disease) ([place])   . Hypertension   . Diabetes mellitus without complication ([place])   . Arthritis     HOSPITAL COURSE:   #1. Acute on chronic renal failure due to obstruction at UVJ with mass, concerning for cancer,   Appreciated Urology help, Cystoscopy is done- he could not place a stent and biopsies were collected.  Suggested to have nephrostomy tube by IR  Nephrology also on the case.    Pt started having fever, so urologist suggested to have it done today.    I spoke to Oncall Dr Shanda Bumps at [facility]- he agreed to have the procedure today once the pt is at [facility]. #2 ureterovesical junction obstruction with mass of unclear etiology, #3. Urinary tract infection, get urinary cultures, initiate patient on the Rocephin intravenously  Blood cultures sent   Now with fever and obstruction, need to be transferred and procedure today. #4. Leukocytosis, follow with antibiotic therapy #5. Anemia, negative Hemoccult- likely due to ch kidney disease. #6. Weight Loss Of unclear etiology, follow patient's oral intake, get dietitian involved for recommendations  Likely due to bladder cancer. Further evaluation per  urology. #7 Hypoglycemia  Due to renal failure, and taking insulin- increses its clearance time.  IV D5 was given still continued to stay hypo.  I gave D50 injection twice and started on D10 drip.  Monitor frequently.  DISCHARGE CONDITIONS:   Stable.  CONSULTS OBTAINED:  Treatment Team:  Franchot Gallo, MD Anthonette Legato, MD  DRUG ALLERGIES:   Allergies  Allergen Reactions  . Percocet [Oxycodone-Acetaminophen] Hives    DISCHARGE MEDICATIONS:   Current Discharge Medication List    START taking these medications   Details  cefTRIAXone 1 g in dextrose 5 % 50 mL Inject 1 g into the vein daily. Qty: 1 g, Refills: 0    dextrose 10 % infusion Inject 75 mL/hr into the vein continuous. Qty: 500 mL, Refills: 0      CONTINUE these medications which have NOT CHANGED   Details  Albuterol Sulfate (PROAIR RESPICLICK) 616 (90 Base) MCG/ACT AEPB Inhale 2 puffs into the lungs 4 (four) times daily as needed (wheezing).     amLODipine (NORVASC) 10 MG tablet Take 10 mg by mouth daily.    aspirin EC 81 MG tablet Take 81 mg by mouth daily.    atorvastatin (LIPITOR) 40 MG tablet Take 40 mg by mouth daily.    esomeprazole (NEXIUM) 40 MG capsule Take 40 mg by mouth daily at 12 noon.    Travoprost, BAK Free, (TRAVATAN) 0.004 % SOLN ophthalmic solution Place 1 drop into both eyes at bedtime.      STOP taking these medications     furosemide (LASIX) 40 MG tablet      gabapentin (  NEURONTIN) 300 MG capsule      glipiZIDE (GLUCOTROL XL) 5 MG 24 hr tablet      insulin glargine (LANTUS) 100 UNIT/ML injection      lidocaine (XYLOCAINE) 5 % ointment      linagliptin (TRADJENTA) 5 MG TABS tablet      losartan-hydrochlorothiazide (HYZAAR) 100-25 MG tablet          DISCHARGE INSTRUCTIONS:   Follow with urology.  If you experience worsening of your admission symptoms, develop shortness of breath, life threatening emergency, suicidal or homicidal thoughts you must seek  medical attention immediately by calling 911 or calling your MD immediately  if symptoms less severe.  You Must read complete instructions/literature along with all the possible adverse reactions/side effects for all the Medicines you take and that have been prescribed to you. Take any new Medicines after you have completely understood and accept all the possible adverse reactions/side effects.   Please note  You were cared for by a hospitalist during your hospital stay. If you have any questions about your discharge medications or the care you received while you were in the hospital after you are discharged, you can call the unit and asked to speak with the hospitalist on call if the hospitalist that took care of you is not available. Once you are discharged, your primary care physician will handle any further medical issues. Please note that NO REFILLS for any discharge medications will be authorized once you are discharged, as it is imperative that you return to your primary care physician (or establish a relationship with a primary care physician if you do not have one) for your aftercare needs so that they can reassess your need for medications and monitor your lab values.    Today   CHIEF COMPLAINT:   Chief Complaint  Patient presents with  . Hyperglycemia    HISTORY OF PRESENT ILLNESS:  Barbara [name]  is a 75 y.o. female with a known history of recent admission to the hospital May 30 through June 5 for Escherichia coli sepsis due to UTI, CK D stage 4, COPD, hypertension, diabetes mellitus without complication, arthritis, who presents from the skilled nursing facility/ability to a facility with feeling of cold/chilly, generous weakness, left sided abdominal and flank pain, weight loss from 240-222 pounds over the past one months while in the rehabilitation facility. On arrival to the hospital, she was noted to be in acute on chronic renal failure with creatinine level going up from 2.26 on  June 5 to 4.89 today. Hospitalist services were contacted for admission. Patient's labs also revealed mild pyuria, leukocytosis, anemia. Radiology studies revealed left hydroureteronephrosis and a soft mass at the UVJ , concerning for cancer.    VITAL SIGNS:  Blood pressure 175/56, pulse 100, temperature 100.3 F (37.9 C), temperature source Oral, resp. rate 17, height 5' 8"  (1.727 m), weight 91.627 kg (202 lb), SpO2 96 %.  I/O:   Intake/Output Summary (Last 24 hours) at 01/21/16 1536 Last data filed at 01/21/16 1245  Gross per 24 hour  Intake 1996.6 ml  Output   2070 ml  Net  -73.4 ml    PHYSICAL EXAMINATION:  GENERAL: 75 y.o.-year-old patient lying in the bed with no acute distress.  EYES: Pupils equal, round, reactive to light and accommodation. No scleral icterus. Extraocular muscles intact.  HEENT: Head atraumatic, normocephalic. Oropharynx and nasopharynx clear.  NECK: Supple, no jugular venous distention. No thyroid enlargement, no tenderness.  LUNGS: Normal breath sounds bilaterally, no wheezing,  rales,rhonchi or crepitation. No use of accessory muscles of respiration.  CARDIOVASCULAR: S1, S2 normal. No murmurs, rubs, or gallops.  ABDOMEN: Soft, slight lower abdominal tenderness, nondistended. Bowel sounds present. No organomegaly or mass.  EXTREMITIES: No pedal edema, cyanosis, or clubbing.  NEUROLOGIC: Cranial nerves II through XII are intact. Muscle strength 3-4/5 in all extremities. Sensation intact. Gait not checked.  PSYCHIATRIC: The patient is alert and oriented x 3.  SKIN: No obvious rash, lesion, or ulcer.   DATA REVIEW:   CBC  Recent Labs Lab 01/21/16 0434  WBC 12.1*  HGB 8.1*  HCT 24.7*  PLT 227    Chemistries   Recent Labs Lab 01/20/16 1303  01/21/16 0434  NA 138  --  139  K 4.2  --  3.4*  CL 107  --  109  CO2 19*  --  20*  GLUCOSE 84  --  47*  BUN 60*  --  53*  CREATININE 4.89*  < > 4.34*  CALCIUM 8.6*  --  8.3*  AST 17  --    --   ALT 13*  --   --   ALKPHOS 70  --   --   BILITOT 0.3  --   --   < > = values in this interval not displayed.  Cardiac Enzymes No results for input(s): TROPONINI in the last 168 hours.  Microbiology Results  Results for orders placed or performed during the hospital encounter of 01/20/16  CULTURE, BLOOD (ROUTINE X 2) w Reflex to ID Panel     Status: None (Preliminary result)   Collection Time: 01/20/16  6:59 PM  Result Value Ref Range Status   Specimen Description BLOOD LEFT HAND  Final   Special Requests BOTTLES DRAWN AEROBIC AND ANAEROBIC 5CC  Final   Culture NO GROWTH < 24 HOURS  Final   Report Status PENDING  Incomplete  CULTURE, BLOOD (ROUTINE X 2) w Reflex to ID Panel     Status: None (Preliminary result)   Collection Time: 01/20/16  7:10 PM  Result Value Ref Range Status   Specimen Description BLOOD RIGHT HAND  Final   Special Requests BOTTLES DRAWN AEROBIC AND ANAEROBIC 5CC  Final   Culture NO GROWTH < 24 HOURS  Final   Report Status PENDING  Incomplete  Surgical pcr screen     Status: None   Collection Time: 01/21/16  6:16 AM  Result Value Ref Range Status   MRSA, PCR NEGATIVE NEGATIVE Final   Staphylococcus aureus NEGATIVE NEGATIVE Final    Comment:        The Xpert SA Assay (FDA approved for NASAL specimens in patients over 22 years of age), is one component of a comprehensive surveillance program.  Test performance has been validated by [name] for patients greater than or equal to 32 year old. It is not intended to diagnose infection nor to guide or monitor treatment.     RADIOLOGY:  Ct Renal Stone Study  01/20/2016  CLINICAL DATA:  Left flank pain today.  No known injury. EXAM: CT ABDOMEN AND PELVIS WITHOUT CONTRAST TECHNIQUE: Multidetector CT imaging of the abdomen and pelvis was performed following the standard protocol without IV contrast. COMPARISON:  CT abdomen and pelvis 10/03/2014. FINDINGS: There is cardiomegaly. Calcific aortic and coronary  atherosclerosis is identified. No pleural or pericardial effusion. Mild dependent atelectasis is noted. There is mild to moderate left hydronephrosis on the left with dilatation of the left ureter identified. There is abnormal soft tissue attenuation  at the left ureterovesical junction and along the left side of the urinary bladder worrisome for carcinoma. No urinary tract stones are identified on the right or left. Exophytic cyst off the left kidney measuring 5.7 cm in diameter is unchanged. The liver, gallbladder, adrenal glands, spleen and pancreas are unremarkable. No lymphadenopathy or fluid is seen in the abdomen or pelvis. Extensive aortoiliac atherosclerosis without aneurysm is identified. The patient is status post hysterectomy. Scattered colonic diverticula are seen but there is no evidence diverticulitis. The colon is otherwise normal appearance. The stomach, small bowel and appendix appear normal. No lytic or sclerotic bony lesion is seen. Mild appearing lower lumbar spondylosis is noted. IMPRESSION: Mild to moderate left hydroureteronephrosis due to a soft tissue mass at the left UVJ worrisome for carcinoma. Soft tissue attenuation worrisome for mass is also seen along the left side of the urinary bladder. There are no urinary tract stones. Extensive calcific aortic and coronary atherosclerosis. Diverticulosis without diverticulitis. Electronically Signed   By: Inge Rise M.D.   On: 01/20/2016 15:08    EKG:   Orders placed or performed during the hospital encounter of 01/20/16  . EKG 12-Lead  . EKG 12-Lead      Management plans discussed with the patient, family and they are in agreement.  CODE STATUS:     Code Status Orders        Start     Ordered   01/20/16 1842  Full code   Continuous     01/20/16 1841    Code Status History    Date Active Date Inactive Code Status Order ID Comments User Context   12/18/2015  2:14 AM 12/23/2015  4:51 PM Full Code [id number]  Saundra Shelling, MD Inpatient      TOTAL TIME TAKING CARE OF THIS PATIENT: 80 minutes.    Vaughan Basta M.D on 01/21/2016 at 3:36 PM  Between 7am to 6pm - Pager - [phone]  After 6pm go to [email or web address] - password [email or web address] [place]  Office  [phone]  CC: Primary care physician; Madelyn Brunner, MD   Note: This dictation was prepared with Dragon dictation along with smaller phrase technology. Any transcriptional errors that result from this process are unintentional.

## 2016-01-21 NOTE — Op Note (Signed)
Preoperative diagnosis: Left-sided hydronephrosis with extravesical mass  Postoperative diagnosis: Left hydronephrosis with extravesical mass as well as left lateral/left trigonal mass obstructing left ureteral orifice   Procedure: Cystoscopy, attempted left retrograde ureteropyelogram/stent placement, bladder biopsy    Surgeon: Lillette Boxer. Shayla Heming, M.D.   Anesthesia: Gen.   Complications: None  Specimen(s): Biopsies of bladder mass  Drain(s): 62 French Foley catheter to straight drain  Indications: 75 year old female who presented last month with UTI with associated sepsis, treated in June 2017 at this hospital. She recently presented with left flank and abdominal pain, chills, fever, and on presentation, CT scanning revealed left hydronephrosis with a mass adjacent to her left ureteral orifice. Urologic consultation was requested. There was no evidence of bladder mass on CT scan. She presents at this time for cystoscopy, retrograde and double-J stent placement. I discussed the procedure with the patient and her family, as well as the possibility of a percutaneous nephrostomy tube placement if retrograde stent placement is unsuccessful. They understand and agree to proceed.    Technique and findings: The patient was properly identified in the holding area. She is on preoperative IV Rocephin. She was taken to the operating room where monitored anesthesia care was performed. There was, unfortunately, extravasation of the previously placed IV in the right upper arm. Peripheral IV in the left hand was established. Following sedation, the patient was placed in the dorsolithotomy position. Genitalia and perineum were prepped and draped. Timeout was performed.  A 23 French panendoscope was placed under her bladder. Circumferential inspection of the bladder was performed with the 30 lens. This revealed a normal-appearing right trigone. The left trigone/ureteral orifice was obscured by a nodular  tumor. This was approximately 3 cm in size. The base of this obscured the ureteral orifice. There was a small tumor just lateral to this on the bladder neck area which was somewhat necrotic in appearance, approximate 1 cm in size. The remaining bladder appeared normal. I used a 6 [place] open-ended catheter with an associated 0.038 inch sensor-tip guidewire to try to probe at the base of this tumor to attempt to find the left ureteral orifice. Multiple attempts from different angles were unsuccessful, unfortunately. At this point, I felt it best to eventually proceed with drainage of the left kidney using a nephrostomy tube.  I used biopsy forceps to take several biopsies of both the left lateral wall lesion as well as the nodular lesion over the left ureteral orifice area. These were sent labeled "bladder tumor". They were sent for permanent pathology. There is no significant bleeding from these biopsies, so cautery was not used. At this point, the scope was removed. I then placed an 42 French Foley catheter.  The patient was then awakened and taken to the PACU in stable condition, having tolerated the procedure well.

## 2016-01-21 NOTE — Progress Notes (Signed)
Initial Nutrition Assessment  DOCUMENTATION CODES:   Not applicable  INTERVENTION:   Await diet advancement as medically able Pt may benefit from supplementation on follow    NUTRITION DIAGNOSIS:   Inadequate oral intake related to acute illness as evidenced by NPO status.  GOAL:   Patient will meet greater than or equal to 90% of their needs  MONITOR:   PO intake, Diet advancement, Labs, Weight trends, I & O's  REASON FOR ASSESSMENT:   Consult Assessment of nutrition requirement/status  ASSESSMENT:   Pt with recent admission 5/30-6/5 with UTI. Pt admitted with acute on chronic renal failure and per MD note, Radiology studies revealed left hydroureteronephrosis and a soft mass at the UVJ , concerning for cancer.  Urology consulted, scheduled for cystoscopy and placement of left ureteral stent. Pt out of room at surgery this am.  Past Medical History  Diagnosis Date  . COPD (chronic obstructive pulmonary disease) ([place])   . Hypertension   . Diabetes mellitus without complication ([place])   . Arthritis     Diet Order:  Diet NPO time specified   Pt currently NPO, out of room at surgery this am. Per MST no decrease in appetite PTA.  Medications: D10 at 23m/hr, Protonix Labs: K 3.4, BUN 53, Cre4.34, Glucose 47, FSBS 63, 52, 55 this am   Gastrointestinal Profile: Last BM:  01/21/2016   Nutrition-Focused Physical Exam Findings:  Unable to complete Nutrition-Focused physical exam at this time.    Weight Change: Per MD note, pt reported weight of 240lbs down to 222lbs while at rehab over the past month. Per [facility] weight trends 11% weight loss in 10 months, 4% weight loss in one month (from 210lbs to 202lbs)   Skin:  Reviewed, no issues   Height:   Ht Readings from Last 1 Encounters:  01/20/16 5' 8"  (1.727 m)    Weight:   Wt Readings from Last 1 Encounters:  01/20/16 202 lb (91.627 kg)   Wt Readings from Last 10 Encounters:  01/20/16 202 lb (91.627 kg)   12/18/15 210 lb 9.6 oz (95.528 kg)  04/16/15 227 lb 6.4 oz (103.148 kg)  03/14/15 228 lb (103.42 kg)    Ideal Body Weight:   67kg  BMI:  Body mass index is 30.72 kg/(m^2).  Estimated Nutritional Needs:   Kcal:  2010-2345kcals  Protein:  80-100g protein  Fluid:  >/= 2L fluid  EDUCATION NEEDS:   Education needs no appropriate at this time  ADwyane Luo RD, LDN Pager ([phone]/On-Call Pager [phone]

## 2016-01-21 NOTE — Progress Notes (Signed)
Patient accepted for transfer to [facility] from [facility] for nephrostomy tube placement with interventional radiology (Dr. Shanda Bumps) tonight.  I confirmed with the IR tech that they are expecting this patient.  She is admitted for sepsis secondary to UTI and has a new bladder mass with left hydrouteronephrosis on CT.  Dr. Diona Fanti (Urology) performed cystoscopy today but was unable to place ureteral stent.  He is recommending nephrostomy tube placement tonight.  The patient is currently on Rocephin; blood and urine culture are pending.  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  She has been accepted to med surg, inpatient status.

## 2016-01-21 NOTE — Progress Notes (Signed)
Called Dr. Marcille Blanco regarding patient's fluctuating blood sugars.  Patient was given soda, D50- 75m iv and blood sugars were decreasing.  Patient was given D5 and 1/2 NS @ 75 mL/hr.  Doctor agreed to increasing the rate to 100 mL/hr.  CChristene Slates 01/21/2016  3:36 AM

## 2016-01-21 NOTE — Consult Note (Signed)
Chief Complaint: Urosepsis  Referring Physician(s): Dr. Diona Fanti, Urology  Supervising Physician: Corrie Mckusick  Patient Status: Inpatient  History of Present Illness: Barbara [name] is a 75 y.o. female presenting to [facility] as a transfer from [facility] for left percutaneous nephrostomy placement and post-operative care.  A retrograde left nephrostomy stent was precluded by obstructing tumor at the bladder base.  Biopsy was performed at this time of the cystoscopy.    Although it was hoped that the PCN placement could proceed as an elective case once the Interventional Radiology Suite at [facility] was available once more on Wednesday, the patient became hypotensive and hypoglycemic, demonstrating early urosepsis.  She has a history of prior episodes of urosepsis, and transfer was initiated for more urgent treatment.    I have discussed the procedure with her husband, Mr Laticia Vannostrand by phone.  I have specifically discussed the risk/benefit profile and the goals of her therapy today.  The primary goal is to drain the kidney and avoid further progression of urosepsis to avoid hemodynamic compromise and.  Secondary goal is to treat her waning kidney function.  Risks discussed include bleeding, infection, transient bacteremia/sepsis, ICU stay, need for further procedure/surgery, cardiovascular collapse, death.  There is certainly a risk of cardiovascular compromise and death if left undrained and sepsis progresses.  I have answered all of his questions and he consents on her behalf to proceed.    Past Medical History  Diagnosis Date  . COPD (chronic obstructive pulmonary disease) ([place])   . Hypertension   . Diabetes mellitus without complication ([place])   . Arthritis     Past Surgical History  Procedure Laterality Date  . Abdominal hysterectomy    . Rotator cuff repair      left    Allergies: Percocet  Medications: Prior to Admission medications   Medication Sig Start Date End Date Taking?  Authorizing Provider  Albuterol Sulfate (PROAIR RESPICLICK) 157 (90 Base) MCG/ACT AEPB Inhale 2 puffs into the lungs 4 (four) times daily as needed (wheezing).     Historical Provider, MD  amLODipine (NORVASC) 10 MG tablet Take 10 mg by mouth daily.    Historical Provider, MD  aspirin EC 81 MG tablet Take 81 mg by mouth daily.    Historical Provider, MD  atorvastatin (LIPITOR) 40 MG tablet Take 40 mg by mouth daily.    Historical Provider, MD  cefTRIAXone 1 g in dextrose 5 % 50 mL Inject 1 g into the vein daily. 01/21/16   Vaughan Basta, MD  dextrose 10 % infusion Inject 75 mL/hr into the vein continuous. 01/21/16   Vaughan Basta, MD  esomeprazole (NEXIUM) 40 MG capsule Take 40 mg by mouth daily at 12 noon.    Historical Provider, MD  Travoprost, BAK Free, (TRAVATAN) 0.004 % SOLN ophthalmic solution Place 1 drop into both eyes at bedtime.    Historical Provider, MD     Family History  Problem Relation Age of Onset  . Breast cancer Neg Hx     Social History   Social History  . Marital Status: Married    Spouse Name: N/A  . Number of Children: N/A  . Years of Education: N/A   Occupational History  . retired    Social History Main Topics  . Smoking status: Former Research scientist (life sciences)  . Smokeless tobacco: Not on file  . Alcohol Use: No  . Drug Use: No  . Sexual Activity: Not on file   Other Topics Concern  . Not on  file   Social History Narrative     Review of Systems: A 12 point ROS discussed and pertinent positives are indicated in the HPI above.  All other systems are negative.  Review of Systems  Vital Signs: There were no vitals taken for this visit.  Physical Exam     Mallampati Score:     Imaging: Ct Renal Stone Study  01/20/2016  CLINICAL DATA:  Left flank pain today.  No known injury. EXAM: CT ABDOMEN AND PELVIS WITHOUT CONTRAST TECHNIQUE: Multidetector CT imaging of the abdomen and pelvis was performed following the standard protocol without IV contrast.  COMPARISON:  CT abdomen and pelvis 10/03/2014. FINDINGS: There is cardiomegaly. Calcific aortic and coronary atherosclerosis is identified. No pleural or pericardial effusion. Mild dependent atelectasis is noted. There is mild to moderate left hydronephrosis on the left with dilatation of the left ureter identified. There is abnormal soft tissue attenuation at the left ureterovesical junction and along the left side of the urinary bladder worrisome for carcinoma. No urinary tract stones are identified on the right or left. Exophytic cyst off the left kidney measuring 5.7 cm in diameter is unchanged. The liver, gallbladder, adrenal glands, spleen and pancreas are unremarkable. No lymphadenopathy or fluid is seen in the abdomen or pelvis. Extensive aortoiliac atherosclerosis without aneurysm is identified. The patient is status post hysterectomy. Scattered colonic diverticula are seen but there is no evidence diverticulitis. The colon is otherwise normal appearance. The stomach, small bowel and appendix appear normal. No lytic or sclerotic bony lesion is seen. Mild appearing lower lumbar spondylosis is noted. IMPRESSION: Mild to moderate left hydroureteronephrosis due to a soft tissue mass at the left UVJ worrisome for carcinoma. Soft tissue attenuation worrisome for mass is also seen along the left side of the urinary bladder. There are no urinary tract stones. Extensive calcific aortic and coronary atherosclerosis. Diverticulosis without diverticulitis. Electronically Signed   By: Inge Rise M.D.   On: 01/20/2016 15:08    Labs:  CBC:  Recent Labs  12/19/15 1111 01/20/16 1303 01/20/16 1859 01/21/16 0434  WBC 14.0* 11.7* 12.1* 12.1*  HGB 8.1* 7.8* 7.6* 8.1*  HCT 25.0* 24.0* 24.1* 24.7*  PLT 303 237 217 227    COAGS: No results for input(s): INR, APTT in the last 8760 hours.  BMP:  Recent Labs  12/20/15 0957 12/23/15 0527 01/20/16 1303 01/20/16 1859 01/21/16 0434  NA 142 142 138  --   139  K 3.7 3.5 4.2  --  3.4*  CL 112* 109 107  --  109  CO2 20* 24 19*  --  20*  GLUCOSE 133* 103* 84  --  47*  BUN 48* 44* 60*  --  53*  CALCIUM 9.3 9.1 8.6*  --  8.3*  CREATININE 2.01* 2.26* 4.89* 4.78* 4.34*  GFRNONAA 23* 20* 8* 8* 9*  GFRAA 27* 23* 9* 9* 10*    LIVER FUNCTION TESTS:  Recent Labs  04/16/15 1919 12/17/15 2109 01/20/16 1303  BILITOT 0.8 1.1 0.3  AST 30 96* 17  ALT 17 40 13*  ALKPHOS 75 75 70  PROT 7.7 9.0* 8.1  ALBUMIN 4.0 3.8 3.5    TUMOR MARKERS: No results for input(s): AFPTM, CEA, CA199, CHROMGRNA in the last 8760 hours.  Assessment and Plan:  75 yo female with early urosepsis secondary to left kidney obstruction, presenting for left PCN.    We will proceed urgently.   She will be admitted her at [facility] for her post-operative and medical  care.    All of the patient's husband's questions were answered.  We will proceed. Consent signed and in chart.  Thank you for this interesting consult.  I greatly enjoyed meeting [place] and look forward to participating in their care.  A copy of this report was sent to the requesting provider on this date.  Electronically Signed: Corrie Mckusick 01/21/2016, 6:50 PM   I spent a total of 40 Minutes    in face to face in clinical consultation, greater than 50% of which was counseling/coordinating care for obstructed left ureter, PCN placement, urosepsis.

## 2016-01-21 NOTE — Progress Notes (Signed)
Pt's CBG 57 this AM, D10 @ 75 ordered by Dr. Anselm Jungling.  CBG 55 on recheck, 5m of D50 ordered and given.  CBG 53 on recheck.  50 of D50 ordered and given.  CBG 63 on recheck. Report called from specials with CBG's discussed. Pt currently off unit for surgery.

## 2016-01-21 NOTE — Progress Notes (Signed)
Report given to carelink nurse who is at bedside for transport. Family at bedside and aware of transfer, husbands cell number updated in chart.

## 2016-01-21 NOTE — H&P (Signed)
History and Physical  Patient Name: Barbara [name]     UEA:[id number]    DOB: [birth year]    DOA: 01/21/2016 PCP: Madelyn Brunner, MD   Patient coming from: Home --> [place] [facility] --> [facility]  Chief Complaint: Weakness and fever  HPI: Barbara [name] is a 75 y.o. female with a past medical history significant for IDDM, CKD IV, COPD and HTN who presents with urinary obstruction from [facility].  The patient was in her usual state of health (lives with husband, drives, manages finances, functional) until late May when she was admitted with UTI sepsis to [facility].  She was there for a week, treated with Vanc/Zosyn transitioned to oral Cipro for pan-sensitive E coli and discharged to SNF.  She rehabbed successfully and then was home at the end of June until two days ago when she again developed malaise, weakness, chills, and this time left sided abdominal and flank pain.  CT on admission yesterday at [facility] showed "Mild to moderate left hydroureteronephrosis due to a soft tissue mass at the left UVJ worrisome for carcinoma."   Today, she underwent cystoscopy with biopsy by Dr. Diona Fanti, but was unable to place a left ureteral stent.  The patient decompensated with hypotension and tachycardia and fever and confusion later in the day, and so was transferred urgently to [facility] for percutaneous nephrostomy tube placement tonight by IR.  This procedure was just completed before my interview.  The patient is resting in bed, mentating well, but complains of generalized malaise, left back and shoulder pain, and chills.  She was given ceftriaxone at the outside hospital, and urine and blood cultures were sent (as well as an intraprocedural urine culture during PCN placement).        ROS: Pt complains of malaise, left shoulder pain, back pain, chills, hematuria, fever, myalgias.  Pt denies any confusion, vomiting.    All other systems negative except as just noted or noted in the history of present  illness.    Past Medical History  Diagnosis Date  . COPD (chronic obstructive pulmonary disease) ([place])   . Hypertension   . Diabetes mellitus without complication ([place])   . Arthritis     Past Surgical History  Procedure Laterality Date  . Abdominal hysterectomy    . Rotator cuff repair      left    Social History: Patient lives with her husband prior to this illness.  The patient walks without a walker.  She is a former smoker, quit years ago.  She has no dementia, per granddaughter and is independent with all ADLs and IADLs.    Allergies  Allergen Reactions  . Percocet [Oxycodone-Acetaminophen] Hives    Family history: family history includes Cancer in her father; Diabetes in her mother. There is no history of Breast cancer.  Prior to Admission medications   Medication Sig Start Date End Date Taking? Authorizing Provider  Albuterol Sulfate (PROAIR RESPICLICK) 478 (90 Base) MCG/ACT AEPB Inhale 2 puffs into the lungs 4 (four) times daily as needed (wheezing).    Yes Historical Provider, MD  amLODipine (NORVASC) 10 MG tablet Take 10 mg by mouth daily.   Yes Historical Provider, MD  aspirin EC 81 MG tablet Take 81 mg by mouth daily.   Yes Historical Provider, MD  atorvastatin (LIPITOR) 40 MG tablet Take 40 mg by mouth daily.   Yes Historical Provider, MD  esomeprazole (NEXIUM) 40 MG capsule Take 40 mg by mouth daily at 12 noon.   Yes  Historical Provider, MD  Travoprost, BAK Free, (TRAVATAN) 0.004 % SOLN ophthalmic solution Place 1 drop into both eyes at bedtime.   Yes Historical Provider, MD  Gabapentin 600 TID Glipizide XL 5 mg Glargine 36U BID Linagliptin 5 mg Losartan-HCTZ 100-25      Physical Exam: BP 153/65 mmHg  Pulse 97  Temp(Src) 99 F (37.2 C) (Oral)  Resp 20  Ht 5' 8"  (1.727 m)  Wt 94.8 kg (208 lb 15.9 oz)  BMI 31.79 kg/m2  SpO2 96% General appearance: Well-developed, obese elderly  female, alert and in moderate distress from fever, malaise.   Eyes:  Conjunctiva normal, lids and lashes normal.   PERRL.  ENT: No nasal deformity, discharge.  OP tacky dry without lesions.   Lymph: No cervical or supraclavicular lymphadenopathy. Skin: Diaphoretic, no mottling.  No suspicious rashes or lesions. Cardiac: Tachy, regular, nl S1-S2, no murmurs appreciated.  Capillary refill is sluggish.  JVP normal.  No LE edema, TED hose on.  Radial and DP pulses 2+ and symmetric. Respiratory: Tachypnea, no accessory muscle use.  CTAB without rales or wheezes. GI: Abdomen soft without rigidity.  Mild diffuse TTP without guarding. No ascites, distension, hepatosplenomegaly.   MSK: No deformities or effusions.  No clubbing/cyanosis. Neuro: Cranial nerves normal.  Fatigued and weak, but oriented to person, place, time and situation.  Speech is fluent.  Globally weak but symmetric.  Unable to sit up. Psych: Affect in pain.  Judgment and insight appear normal.       Labs on Admission:  I have personally reviewed following labs and imaging studies: CBC:  Recent Labs Lab 01/20/16 1303 01/20/16 1859 01/21/16 0434  WBC 11.7* 12.1* 12.1*  HGB 7.8* 7.6* 8.1*  HCT 24.0* 24.1* 24.7*  MCV 84.2 87.1 86.4  PLT 237 217 527   Basic Metabolic Panel:  Recent Labs Lab 01/20/16 1303 01/20/16 1859 01/21/16 0434  NA 138  --  139  K 4.2  --  3.4*  CL 107  --  109  CO2 19*  --  20*  GLUCOSE 84  --  47*  BUN 60*  --  53*  CREATININE 4.89* 4.78* 4.34*  CALCIUM 8.6*  --  8.3*   GFR: Estimated Creatinine Clearance: 13.3 mL/min (by C-G formula based on Cr of 4.34).  Liver Function Tests:  Recent Labs Lab 01/20/16 1303  AST 17  ALT 13*  ALKPHOS 70  BILITOT 0.3  PROT 8.1  ALBUMIN 3.5    Recent Labs Lab 01/20/16 1303  LIPASE 48   HbA1C:  Recent Labs  01/20/16 1859  HGBA1C 6.5*   CBG:  Recent Labs Lab 01/21/16 1140 01/21/16 1415 01/21/16 1623 01/21/16 1733 01/21/16 2039  GLUCAP 116* 115* 132* 135* 151*   Sepsis Labs: Lactate  pending  Recent Results (from the past 240 hour(s))  CULTURE, BLOOD (ROUTINE X 2) w Reflex to ID Panel     Status: None (Preliminary result)   Collection Time: 01/20/16  6:59 PM  Result Value Ref Range Status   Specimen Description BLOOD LEFT HAND  Final   Special Requests BOTTLES DRAWN AEROBIC AND ANAEROBIC 5CC  Final   Culture NO GROWTH < 24 HOURS  Final   Report Status PENDING  Incomplete  CULTURE, BLOOD (ROUTINE X 2) w Reflex to ID Panel     Status: None (Preliminary result)   Collection Time: 01/20/16  7:10 PM  Result Value Ref Range Status   Specimen Description BLOOD RIGHT HAND  Final   Special Requests  BOTTLES DRAWN AEROBIC AND ANAEROBIC 5CC  Final   Culture NO GROWTH < 24 HOURS  Final   Report Status PENDING  Incomplete  Surgical pcr screen     Status: None   Collection Time: 01/21/16  6:16 AM  Result Value Ref Range Status   MRSA, PCR NEGATIVE NEGATIVE Final   Staphylococcus aureus NEGATIVE NEGATIVE Final    Comment:        The Xpert SA Assay (FDA approved for NASAL specimens in patients over 8 years of age), is one component of a comprehensive surveillance program.  Test performance has been validated by [facility] for patients greater than or equal to 68 year old. It is not intended to diagnose infection nor to guide or monitor treatment.           Culture data: 7/3: Urine culture >> NGTD 7/3: Blood culture x2 >> NGTD      Radiological Exams on Admission: Personally reviewed: Ct Renal Stone Study  01/20/2016  CLINICAL DATA:  Left flank pain today.  No known injury. EXAM: CT ABDOMEN AND PELVIS WITHOUT CONTRAST TECHNIQUE: Multidetector CT imaging of the abdomen and pelvis was performed following the standard protocol without IV contrast. COMPARISON:  CT abdomen and pelvis 10/03/2014. FINDINGS: There is cardiomegaly. Calcific aortic and coronary atherosclerosis is identified. No pleural or pericardial effusion. Mild dependent atelectasis is noted. There is  mild to moderate left hydronephrosis on the left with dilatation of the left ureter identified. There is abnormal soft tissue attenuation at the left ureterovesical junction and along the left side of the urinary bladder worrisome for carcinoma. No urinary tract stones are identified on the right or left. Exophytic cyst off the left kidney measuring 5.7 cm in diameter is unchanged. The liver, gallbladder, adrenal glands, spleen and pancreas are unremarkable. No lymphadenopathy or fluid is seen in the abdomen or pelvis. Extensive aortoiliac atherosclerosis without aneurysm is identified. The patient is status post hysterectomy. Scattered colonic diverticula are seen but there is no evidence diverticulitis. The colon is otherwise normal appearance. The stomach, small bowel and appendix appear normal. No lytic or sclerotic bony lesion is seen. Mild appearing lower lumbar spondylosis is noted. IMPRESSION: Mild to moderate left hydroureteronephrosis due to a soft tissue mass at the left UVJ worrisome for carcinoma. Soft tissue attenuation worrisome for mass is also seen along the left side of the urinary bladder. There are no urinary tract stones. Extensive calcific aortic and coronary atherosclerosis. Diverticulosis without diverticulitis. Electronically Signed   By: Inge Rise M.D.   On: 01/20/2016 15:08     Antibiotics: Ceftriaxone: 7/3 >> 7/4 d/c'd Cefepime: 7/4 >>      Assessment/Plan 1. Sepsis from UTI:  Suspected source urine with obstruction. Organism unknown. Patient meets criteria given tachycardia, tachypnea, fever, leukocytosis, and evidence of organ dysfunction.  Lactate pending.     -Sepsis bundle utilized:  -Blood and urine cultures drawn  -30 ml/kg bolus NOT given at OSH, will obtain lactic acid now and given 30 cc/kg bolus  -Start targeted antibiotics with cefepime, given recent instrumentation, recent abx use and hospitalization and SNF stay  -Repeat renal function and complete  blood count in AM  -Code SEPSIS called to E-link    2. AKI on CKD IV:  In setting of sepsis and pyelonephritis and obstruction -Hold ARB -Fluids and trend BMP and strict I/Os  3. Urinary obstruction/mass:  Biopsy by Urology on 7/3 -Surgical pathology pending -Perc drain placed, IR and Urology following  4. IDDM:  -  Hold glargine given AKI and hypoglycemia at OSH for now -SSI as needed -Hold glipizide and linagliptin for now  5. HTN:  Hypotensive at OSH -Hold HCTZ and amlodipine for now -Labetalol PRN for SBP > 180 -Hold ARB given AKI  6. COPD:  -Albuterol PRN  7. Hypokalemia:  Mild -MIVF with K and trend BMP  8. Anemia in CKD: Stable -Transfusion threshold 7 g/dL for now      DVT prophylaxis: SCDs until urine bleeding clears more, then heparin  Code Status: FULL  Family Communication: Granddaughter at bedside  Disposition Plan: Anticipate IV fluids and antibiotics, follow culture data and renal function.  Likely SNF at D/c Consults called: Urology, IR Admission status: INPATIENT, med surg    Medical decision making: Patient seen at 9:03 PM on 01/21/2016. What exists of the patient's chart was reviewed in depth.  Clinical condition: requiring additional fluids and close monitoring of lab studies and vital signs.        Edwin Dada [facility] Pager [phone]

## 2016-01-21 NOTE — Sedation Documentation (Signed)
Patient is resting comfortably. 

## 2016-01-21 NOTE — Care Management (Signed)
Patient with discharge form [place] to a skilled nursing facility within the last 30 days readmitted abdominal and flank pain.  She had discharged home from [facility] to home with home health services.  Patient nor her family member that is present can remember the name of the agency.   It also sounds as though she may have some personal care services b ut she is not able to elaborate.  Patient has significant hydronephrosis due to posterior bladder mass. This was not present on the abd CT from last admission.  Urology performed biopsies but stent was unsuccessful.  Patient will require nephrostomy.

## 2016-01-21 NOTE — Anesthesia Preprocedure Evaluation (Signed)
Anesthesia Evaluation  Patient identified by MRN, date of birth, ID band Patient awake    Reviewed: Allergy & Precautions, H&P , NPO status , Patient's Chart, lab work & pertinent test results, reviewed documented beta blocker date and time   Airway Mallampati: II  TM Distance: >3 FB Neck ROM: full    Dental  (+) Teeth Intact   Pulmonary neg pulmonary ROS, COPD, former smoker,    Pulmonary exam normal        Cardiovascular Exercise Tolerance: Good hypertension, On Medications negative cardio ROS Normal cardiovascular exam Rhythm:regular Rate:Normal     Neuro/Psych negative neurological ROS  negative psych ROS   GI/Hepatic negative GI ROS, Neg liver ROS,   Endo/Other  negative endocrine ROSdiabetes  Renal/GU Renal diseasenegative Renal ROS  negative genitourinary   Musculoskeletal   Abdominal   Peds  Hematology negative hematology ROS (+) anemia ,   Anesthesia Other Findings   Reproductive/Obstetrics negative OB ROS                             Anesthesia Physical Anesthesia Plan  ASA: III and emergent  Anesthesia Plan: General LMA   Post-op Pain Management:    Induction:   Airway Management Planned:   Additional Equipment:   Intra-op Plan:   Post-operative Plan:   Informed Consent: I have reviewed the patients History and Physical, chart, labs and discussed the procedure including the risks, benefits and alternatives for the proposed anesthesia with the patient or authorized representative who has indicated his/her understanding and acceptance.     Plan Discussed with: CRNA  Anesthesia Plan Comments:         Anesthesia Quick Evaluation

## 2016-01-21 NOTE — Progress Notes (Signed)
[place] [facility] [place]: Barbara [name]    MR#:  [id number]  DATE OF BIRTH:  June 18, 1941  SUBJECTIVE:  CHIEF COMPLAINT:   Chief Complaint  Patient presents with  . Hypoglycemia     Brought with hypoglycemia, also have acute on ch renal failure and mass in bladder near opening of left ureter causing blockage.  REVIEW OF SYSTEMS:  CONSTITUTIONAL: No fever, fatigue or weakness.  EYES: No blurred or double vision.  EARS, NOSE, AND THROAT: No tinnitus or ear pain.  RESPIRATORY: No cough, shortness of breath, wheezing or hemoptysis.  CARDIOVASCULAR: No chest pain, orthopnea, edema.  GASTROINTESTINAL: No nausea, vomiting, diarrhea or abdominal pain.  GENITOURINARY: No dysuria, hematuria.  ENDOCRINE: No polyuria, nocturia,  HEMATOLOGY: No anemia, easy bruising or bleeding SKIN: No rash or lesion. MUSCULOSKELETAL: No joint pain or arthritis.   NEUROLOGIC: No tingling, numbness, weakness.  PSYCHIATRY: No anxiety or depression.   ROS  DRUG ALLERGIES:   Allergies  Allergen Reactions  . Percocet [Oxycodone-Acetaminophen] Hives    VITALS:  Blood pressure 174/57, pulse 97, temperature 98.8 F (37.1 C), temperature source Oral, resp. rate 19, height 5' 8"  (1.727 m), weight 91.627 kg (202 lb), SpO2 97 %.  PHYSICAL EXAMINATION:  GENERAL:  75 y.o.-year-old patient lying in the bed with no acute distress.  EYES: Pupils equal, round, reactive to light and accommodation. No scleral icterus. Extraocular muscles intact.  HEENT: Head atraumatic, normocephalic. Oropharynx and nasopharynx clear.  NECK:  Supple, no jugular venous distention. No thyroid enlargement, no tenderness.  LUNGS: Normal breath sounds bilaterally, no wheezing, rales,rhonchi or crepitation. No use of accessory muscles of respiration.  CARDIOVASCULAR: S1, S2 normal. No murmurs, rubs, or gallops.  ABDOMEN: Soft, slight lower abdominal tenderness, nondistended. Bowel sounds present. No  organomegaly or mass.  EXTREMITIES: No pedal edema, cyanosis, or clubbing.  NEUROLOGIC: Cranial nerves II through XII are intact. Muscle strength 3-4/5 in all extremities. Sensation intact. Gait not checked.  PSYCHIATRIC: The patient is alert and oriented x 3.  SKIN: No obvious rash, lesion, or ulcer.   Physical Exam LABORATORY PANEL:   CBC  Recent Labs Lab 01/21/16 0434  WBC 12.1*  HGB 8.1*  HCT 24.7*  PLT 227   ------------------------------------------------------------------------------------------------------------------  Chemistries   Recent Labs Lab 01/20/16 1303  01/21/16 0434  NA 138  --  139  K 4.2  --  3.4*  CL 107  --  109  CO2 19*  --  20*  GLUCOSE 84  --  47*  BUN 60*  --  53*  CREATININE 4.89*  < > 4.34*  CALCIUM 8.6*  --  8.3*  AST 17  --   --   ALT 13*  --   --   ALKPHOS 70  --   --   BILITOT 0.3  --   --   < > = values in this interval not displayed. ------------------------------------------------------------------------------------------------------------------  Cardiac Enzymes No results for input(s): TROPONINI in the last 168 hours. ------------------------------------------------------------------------------------------------------------------  RADIOLOGY:  Ct Renal Stone Study  01/20/2016  CLINICAL DATA:  Left flank pain today.  No known injury. EXAM: CT ABDOMEN AND PELVIS WITHOUT CONTRAST TECHNIQUE: Multidetector CT imaging of the abdomen and pelvis was performed following the standard protocol without IV contrast. COMPARISON:  CT abdomen and pelvis 10/03/2014. FINDINGS: There is cardiomegaly. Calcific aortic and coronary atherosclerosis is identified. No pleural or pericardial effusion. Mild dependent atelectasis is noted. There is mild to moderate left hydronephrosis on  the left with dilatation of the left ureter identified. There is abnormal soft tissue attenuation at the left ureterovesical junction and along the left side of the urinary  bladder worrisome for carcinoma. No urinary tract stones are identified on the right or left. Exophytic cyst off the left kidney measuring 5.7 cm in diameter is unchanged. The liver, gallbladder, adrenal glands, spleen and pancreas are unremarkable. No lymphadenopathy or fluid is seen in the abdomen or pelvis. Extensive aortoiliac atherosclerosis without aneurysm is identified. The patient is status post hysterectomy. Scattered colonic diverticula are seen but there is no evidence diverticulitis. The colon is otherwise normal appearance. The stomach, small bowel and appendix appear normal. No lytic or sclerotic bony lesion is seen. Mild appearing lower lumbar spondylosis is noted. IMPRESSION: Mild to moderate left hydroureteronephrosis due to a soft tissue mass at the left UVJ worrisome for carcinoma. Soft tissue attenuation worrisome for mass is also seen along the left side of the urinary bladder. There are no urinary tract stones. Extensive calcific aortic and coronary atherosclerosis. Diverticulosis without diverticulitis. Electronically Signed   By: Inge Rise M.D.   On: 01/20/2016 15:08    ASSESSMENT AND PLAN:   Principal Problem:   Acute on chronic renal failure (HCC) Active Problems:   UTI (lower urinary tract infection)   Hydroureteronephrosis   Ureterovesical junction obstruction   Leukocytosis   Anemia  #1. Acute on chronic renal failure due to obstruction at UVJ with mass, concerning for cancer,    Appreciated Urology help, Cystoscopy is done- he could not place a stent and biopsies were collected.   Suggested to have nephrostomy tube.   Nephrology also on the case. #2 ureterovesical junction obstruction with mass of unclear etiology, #3. Urinary tract infection, get urinary cultures, initiate patient on the Rocephin intravenously   Blood cultures sent #4. Leukocytosis, follow with antibiotic therapy #5. Anemia, negative Hemoccult- likely due to ch kidney disease. #6. Weight  Loss Of unclear etiology, follow patient's oral intake, get dietitian involved for recommendations     Likely due to bladder cancer. Further evaluation per urology. #7 Hypoglycemia   Due to renal failure, and taking insulin- increses its clearance time.   IV D5 was given still continued to stay hypo.   I gave D50 injection twice and started on D10 drip.   Monitor frequently.  All the records are reviewed and case discussed with Care Management/Social Workerr. Management plans discussed with the patient, family and they are in agreement.  CODE STATUS: Full.  TOTAL TIME TAKING CARE OF THIS PATIENT: 50 critical care minutes.   Condition critical due to Hypoglycemia and Obstructive renal failure.  POSSIBLE D/C IN 2-3 DAYS, DEPENDING ON CLINICAL CONDITION.   Vaughan Basta M.D on 01/21/2016   Between 7am to 6pm - Pager - [phone]  After 6pm go to [email or web address] - password [email or web address] [place]  Office  [phone]  CC: Primary care physician; Madelyn Brunner, MD  Note: This dictation was prepared with Dragon dictation along with smaller phrase technology. Any transcriptional errors that result from this process are unintentional.

## 2016-01-21 NOTE — Progress Notes (Signed)
Today in procedure urologist did biopsy of the mass, but could not pass a stent.  Pt had fever after the procedure, so I called urologist- he suggested transfer today to the place where IR can do Nephrostomy.  I spoke ot [facility] radiology- Dr. Shanda Bumps- he agreed to have procedure at [facility].  I called Hospitalist for admission and waiting for response.  Additional time spent 30 min.

## 2016-01-22 ENCOUNTER — Encounter (HOSPITAL_COMMUNITY): Payer: Self-pay | Admitting: General Practice

## 2016-01-22 DIAGNOSIS — N184 Chronic kidney disease, stage 4 (severe): Secondary | ICD-10-CM

## 2016-01-22 DIAGNOSIS — Z794 Long term (current) use of insulin: Secondary | ICD-10-CM

## 2016-01-22 DIAGNOSIS — I10 Essential (primary) hypertension: Secondary | ICD-10-CM

## 2016-01-22 DIAGNOSIS — A0472 Enterocolitis due to Clostridium difficile, not specified as recurrent: Secondary | ICD-10-CM | POA: Diagnosis present

## 2016-01-22 DIAGNOSIS — D631 Anemia in chronic kidney disease: Secondary | ICD-10-CM

## 2016-01-22 DIAGNOSIS — N189 Chronic kidney disease, unspecified: Secondary | ICD-10-CM

## 2016-01-22 DIAGNOSIS — N179 Acute kidney failure, unspecified: Secondary | ICD-10-CM

## 2016-01-22 DIAGNOSIS — N139 Obstructive and reflux uropathy, unspecified: Secondary | ICD-10-CM

## 2016-01-22 DIAGNOSIS — A047 Enterocolitis due to Clostridium difficile: Secondary | ICD-10-CM

## 2016-01-22 DIAGNOSIS — A419 Sepsis, unspecified organism: Principal | ICD-10-CM

## 2016-01-22 DIAGNOSIS — E1122 Type 2 diabetes mellitus with diabetic chronic kidney disease: Secondary | ICD-10-CM

## 2016-01-22 LAB — URINE CULTURE: Special Requests: NORMAL

## 2016-01-22 LAB — CBC
HEMATOCRIT: 24.1 % — AB (ref 36.0–46.0)
HEMOGLOBIN: 7.4 g/dL — AB (ref 12.0–15.0)
MCH: 27.5 pg (ref 26.0–34.0)
MCHC: 30.7 g/dL (ref 30.0–36.0)
MCV: 89.6 fL (ref 78.0–100.0)
Platelets: 232 10*3/uL (ref 150–400)
RBC: 2.69 MIL/uL — AB (ref 3.87–5.11)
RDW: 16 % — ABNORMAL HIGH (ref 11.5–15.5)
WBC: 14.6 10*3/uL — ABNORMAL HIGH (ref 4.0–10.5)

## 2016-01-22 LAB — GLUCOSE, CAPILLARY
GLUCOSE-CAPILLARY: 169 mg/dL — AB (ref 65–99)
GLUCOSE-CAPILLARY: 147 mg/dL — AB (ref 65–99)
Glucose-Capillary: 160 mg/dL — ABNORMAL HIGH (ref 65–99)
Glucose-Capillary: 127 mg/dL — ABNORMAL HIGH (ref 65–99)

## 2016-01-22 LAB — C DIFFICILE QUICK SCREEN W PCR REFLEX
C Diff antigen: POSITIVE — AB
C Diff interpretation: DETECTED
C Diff toxin: POSITIVE — AB

## 2016-01-22 LAB — BASIC METABOLIC PANEL
Anion gap: 9 (ref 5–15)
BUN: 38 mg/dL — AB (ref 6–20)
CHLORIDE: 109 mmol/L (ref 101–111)
CO2: 18 mmol/L — AB (ref 22–32)
CREATININE: 3.44 mg/dL — AB (ref 0.44–1.00)
Calcium: 8.5 mg/dL — ABNORMAL LOW (ref 8.9–10.3)
GFR calc Af Amer: 14 mL/min — ABNORMAL LOW (ref >60)
GFR calc non Af Amer: 12 mL/min — ABNORMAL LOW (ref >60)
GLUCOSE: 140 mg/dL — AB (ref 65–99)
Potassium: 4.4 mmol/L (ref 3.5–5.1)
Sodium: 136 mmol/L (ref 135–145)

## 2016-01-22 MED ORDER — VANCOMYCIN 50 MG/ML ORAL SOLUTION
125.0000 mg | Freq: Four times a day (QID) | ORAL | Status: DC
  Administered 2016-01-22 – 2016-01-27 (×15): 125 mg via ORAL
  Filled 2016-01-22 (×24): qty 2.5

## 2016-01-22 NOTE — Progress Notes (Signed)
MD made aware that patient's c.diff sample was positive.

## 2016-01-22 NOTE — Progress Notes (Signed)
[facility] Progress Note  Patient: Barbara [name] OIN:[id number]   PCP: Madelyn Brunner, MD DOB: 11/15/1940   DOA: 01/21/2016   DOS: 01/22/2016   Date of Service: the patient was seen and examined on 01/22/2016  Subjective: Patient mentions having diarrhea. No abdominal pain. No other acute complaint. No chest pain or shortness of breath. Nutrition: Tolerating oral diet with minimal oral intake  Brief hospital course: Pt. with PMH of COPD, DM 2, HTN, anemia; admitted on 01/21/2016, with complaint of fever with tachycardia, was found to have sepsis from probable pyelonephritis as well as C. difficile. Currently further plan is continue antibiotics.  Assessment and Plan: 1. Sepsis ([place]) Recent procedure with percutaneous nephrostomy tube placement. Patient is on IV cefepime. Blood cultures are also sent. C. difficile PCR is positive and will start the patient on vancomycin. Lactic acid is stable. Renal function is also stable. We'll continue to monitor the patient closely on telemetry.  2. Acute on chronic kidney disease stage IV. Holding ARB holding antihypertensive medication. Continue IV hydration.  3. Insulin-dependent diabetes mellitus. Holding long-acting insulin. Next and continue sliding scale insulin.  4. Urinary obstruction possible mass. Next on biopsies sent after procedure. Follow the culture.  5. Essential hypertension. Currently blood pressure medications are on hold. We'll continue to closely monitor.  6. COPD. Next and continue albuterol.  7. C. difficile by PCR. Currently on vancomycin.  Pain management: When necessary Tylenol Activity: Consulted physical therapy Bowel regimen: last BM 01/22/2016 Diet: Renal diet DVT Prophylaxis: subcutaneous Heparin  Advance goals of care discussion: Full code  Family Communication: no family was present at bedside, at the time of interview.   Disposition:  Discharge to likely SNF. Expected discharge date:  01/27/2016  Consultants: Urology, interventional radiology Procedures: IR guided nephrostomy placement on the left  Antibiotics: Anti-infectives    Start     Dose/Rate Route Frequency Ordered Stop   01/22/16 1400  vancomycin (VANCOCIN) 50 mg/mL oral solution 125 mg     125 mg Oral 4 times daily 01/22/16 1239 02/05/16 1359   01/21/16 2130  ceFEPIme (MAXIPIME) 2 g in dextrose 5 % 50 mL IVPB  Status:  Discontinued     2 g 100 mL/hr over 30 Minutes Intravenous  Once 01/21/16 2115 01/21/16 2116   01/21/16 2130  ceFEPIme (MAXIPIME) 1 g in dextrose 5 % 50 mL IVPB     1 g 100 mL/hr over 30 Minutes Intravenous Every 24 hours 01/21/16 2117     01/21/16 2000  cefTRIAXone (ROCEPHIN) 1 g in dextrose 5 % 50 mL IVPB  Status:  Discontinued     1 g 100 mL/hr over 30 Minutes Intravenous Every 24 hours 01/21/16 1936 01/21/16 2115   01/21/16 1945  cefTRIAXone (ROCEPHIN) 1 g in dextrose 5 % 50 mL IVPB  Status:  Discontinued    Comments:  Need dose at 2000 tonight   1 g 100 mL/hr over 30 Minutes Intravenous  Once 01/21/16 1938 01/21/16 1939        Intake/Output Summary (Last 24 hours) at 01/22/16 1916 Last data filed at 01/22/16 1907  Gross per 24 hour  Intake   3760 ml  Output   4225 ml  Net   -465 ml   Filed Weights   01/21/16 2032  Weight: 94.8 kg (208 lb 15.9 oz)    Objective: Physical Exam: Filed Vitals:   01/22/16 1001 01/22/16 1137 01/22/16 1418 01/22/16 1813  BP: 148/47  150/58 135/56  Pulse: 107  105 95  Temp: 100.5 F (38.1 C) 100.9 F (38.3 C) 99.5 F (37.5 C) 99.1 F (37.3 C)  TempSrc: Oral Oral Oral Oral  Resp: 20  20 20   Height:      Weight:      SpO2: 97%  98% 98%    General: Alert, Awake and Oriented to Time, Place and Person. Appear in moderate distress Eyes: PERRL, Conjunctiva normal ENT: Oral Mucosa clear moist. Neck: difficult to assess JVD, no Abnormal Mass Or lumps Cardiovascular: S1 and S2 Present, no Murmur, Respiratory: Bilateral Air entry equal and  Decreased, Clear to Auscultation, no Crackles, no wheezes Abdomen: Bowel Sound present, Soft and no tenderness Skin: no redness, no Rash  Extremities: no Pedal edema, no calf tenderness Neurologic: Grossly no focal neuro deficit. Bilaterally Equal motor strength  Data Reviewed: CBC:  Recent Labs Lab 01/20/16 1303 01/20/16 1859 01/21/16 0434 01/22/16 1242  WBC 11.7* 12.1* 12.1* 14.6*  HGB 7.8* 7.6* 8.1* 7.4*  HCT 24.0* 24.1* 24.7* 24.1*  MCV 84.2 87.1 86.4 89.6  PLT 237 217 227 408   Basic Metabolic Panel:  Recent Labs Lab 01/20/16 1303 01/20/16 1859 01/21/16 0434 01/22/16 0723  NA 138  --  139 136  K 4.2  --  3.4* 4.4  CL 107  --  109 109  CO2 19*  --  20* 18*  GLUCOSE 84  --  47* 140*  BUN 60*  --  53* 38*  CREATININE 4.89* 4.78* 4.34* 3.44*  CALCIUM 8.6*  --  8.3* 8.5*    Liver Function Tests:  Recent Labs Lab 01/20/16 1303  AST 17  ALT 13*  ALKPHOS 70  BILITOT 0.3  PROT 8.1  ALBUMIN 3.5    Recent Labs Lab 01/20/16 1303  LIPASE 48   No results for input(s): AMMONIA in the last 168 hours. Coagulation Profile: No results for input(s): INR, PROTIME in the last 168 hours. Cardiac Enzymes: No results for input(s): CKTOTAL, CKMB, CKMBINDEX, TROPONINI in the last 168 hours. BNP (last 3 results) No results for input(s): PROBNP in the last 8760 hours.  CBG:  Recent Labs Lab 01/21/16 1733 01/21/16 2039 01/22/16 0744 01/22/16 1152 01/22/16 1623  GLUCAP 135* 151* 160* 169* 147*    Studies: Ir Nephrostomy Placement Left  01/22/2016  INDICATION: 75 year old female with a history of urosepsis and obstructed left ureterovesical junction secondary to tumor. Tumor was obstructing stent placement on cystoscopy. EXAM: IR NEPHROSTOMY PLACEMENT LEFT COMPARISON:  None. MEDICATIONS: Hospital administered antibiotics within 1 hour of procedure. ANESTHESIA/SEDATION: Fentanyl 25 mcg IV; Versed 0.5 mg IV Moderate Sedation Time:  15 The patient was continuously  monitored during the procedure by the interventional radiology nurse under my direct supervision. CONTRAST:  10 cc - administered into the collecting system(s) FLUOROSCOPY TIME:  Fluoroscopy Time: 2 minutes 0 seconds (11 mGy). COMPLICATIONS: None PROCEDURE: Informed written consent was obtained from the patient's family after a thorough discussion of the procedural risks, benefits and alternatives. All questions were addressed. Maximal Sterile Barrier Technique was utilized including caps, mask, sterile gowns, sterile gloves, sterile drape, hand hygiene and skin antiseptic. A timeout was performed prior to the initiation of the procedure. Patient positioned prone position on the fluoroscopy table. Ultrasound survey of the left flank was performed with images stored and sent to PACs. The patient was then prepped and draped in the usual sterile fashion. 1% lidocaine was used to anesthetize the skin and subcutaneous tissues for local anesthesia. A Chiba needle was then used to  access a posterior inferior calyx with ultrasound guidance. With spontaneous urine returned through the needle, passage of an 018 micro wire into the collecting system was performed under fluoroscopy. A small incision was made with an 11 blade scalpel, and the needle was removed from the wire. An Accustick system was then advanced over the wire into the collecting system under fluoroscopy. The metal stiffener and inner dilator were removed, and then a sample of fluid was aspirated through the 4 French outer sheath. Bentson wire was passed into the collecting system and the sheath removed. Ten French dilation of the soft tissues was performed. Using modified Seldinger technique, a 10 French pigtail catheter drain was placed over the Bentson wire. Wire and inner stiffener removed, and the pigtail was formed in the collecting system. Small amount of contrast confirmed position of the catheter. Patient tolerated the procedure well and remained  hemodynamically stable throughout. No complications were encountered and no significant blood loss encountered IMPRESSION: Status post left percutaneous nephrostomy placement. Signed, Dulcy Fanny. Earleen Newport, DO [facility] Electronically Signed   By: Corrie Mckusick D.O.   On: 01/22/2016 07:50     Scheduled Meds: . aspirin EC  81 mg Oral Daily  . atorvastatin  40 mg Oral Daily  . ceFEPime (MAXIPIME) IV  1 g Intravenous Q24H  . insulin aspart  0-15 Units Subcutaneous TID WC  . insulin aspart  0-5 Units Subcutaneous QHS  . vancomycin  125 mg Oral QID   Continuous Infusions: . 0.9 % NaCl with KCl 20 mEq / L 125 mL/hr at 01/22/16 1203   PRN Meds: acetaminophen **OR** acetaminophen, albuterol, labetalol, ondansetron **OR** ondansetron (ZOFRAN) IV  Time spent: 30 minutes  Author: Berle Mull, MD [facility] Pager: [phone] 01/22/2016 7:16 PM  If 7PM-7AM, please contact night-coverage at [email or web address], password [email or web address]

## 2016-01-23 DIAGNOSIS — N390 Urinary tract infection, site not specified: Secondary | ICD-10-CM

## 2016-01-23 DIAGNOSIS — J449 Chronic obstructive pulmonary disease, unspecified: Secondary | ICD-10-CM

## 2016-01-23 LAB — CBC WITH DIFFERENTIAL/PLATELET
BASOS ABS: 0 10*3/uL (ref 0.0–0.1)
Basophils Relative: 0 %
Eosinophils Absolute: 0.1 10*3/uL (ref 0.0–0.7)
Eosinophils Relative: 1 %
HEMATOCRIT: 25.5 % — AB (ref 36.0–46.0)
HEMOGLOBIN: 7.6 g/dL — AB (ref 12.0–15.0)
LYMPHS PCT: 13 %
Lymphs Abs: 1.8 10*3/uL (ref 0.7–4.0)
MCH: 26.4 pg (ref 26.0–34.0)
MCHC: 29.8 g/dL — AB (ref 30.0–36.0)
MCV: 88.5 fL (ref 78.0–100.0)
MONO ABS: 0.7 10*3/uL (ref 0.1–1.0)
MONOS PCT: 5 %
NEUTROS ABS: 11.5 10*3/uL — AB (ref 1.7–7.7)
Neutrophils Relative %: 81 %
Platelets: 235 10*3/uL (ref 150–400)
RBC: 2.88 MIL/uL — ABNORMAL LOW (ref 3.87–5.11)
RDW: 15.7 % — AB (ref 11.5–15.5)
WBC: 14.2 10*3/uL — ABNORMAL HIGH (ref 4.0–10.5)

## 2016-01-23 LAB — URINE CULTURE: Culture: NO GROWTH

## 2016-01-23 LAB — GLUCOSE, CAPILLARY
GLUCOSE-CAPILLARY: 130 mg/dL — AB (ref 65–99)
Glucose-Capillary: 145 mg/dL — ABNORMAL HIGH (ref 65–99)
Glucose-Capillary: 133 mg/dL — ABNORMAL HIGH (ref 65–99)
Glucose-Capillary: 126 mg/dL — ABNORMAL HIGH (ref 65–99)

## 2016-01-23 LAB — COMPREHENSIVE METABOLIC PANEL
ALBUMIN: 2.5 g/dL — AB (ref 3.5–5.0)
ALK PHOS: 82 U/L (ref 38–126)
ALT: 15 U/L (ref 14–54)
AST: 17 U/L (ref 15–41)
Anion gap: 7 (ref 5–15)
BUN: 29 mg/dL — ABNORMAL HIGH (ref 6–20)
CALCIUM: 9.1 mg/dL (ref 8.9–10.3)
CO2: 18 mmol/L — AB (ref 22–32)
CREATININE: 2.66 mg/dL — AB (ref 0.44–1.00)
Chloride: 115 mmol/L — ABNORMAL HIGH (ref 101–111)
GFR calc Af Amer: 19 mL/min — ABNORMAL LOW (ref >60)
GFR calc non Af Amer: 16 mL/min — ABNORMAL LOW (ref >60)
GLUCOSE: 138 mg/dL — AB (ref 65–99)
Potassium: 4.7 mmol/L (ref 3.5–5.1)
SODIUM: 140 mmol/L (ref 135–145)
Total Bilirubin: 0.6 mg/dL (ref 0.3–1.2)
Total Protein: 6.9 g/dL (ref 6.5–8.1)

## 2016-01-23 LAB — PROTIME-INR
INR: 1.38 (ref 0.00–1.49)
Prothrombin Time: 17 seconds — ABNORMAL HIGH (ref 11.6–15.2)

## 2016-01-23 LAB — MAGNESIUM: Magnesium: 1.4 mg/dL — ABNORMAL LOW (ref 1.7–2.4)

## 2016-01-23 MED ORDER — MAGNESIUM SULFATE 2 GM/50ML IV SOLN
2.0000 g | Freq: Once | INTRAVENOUS | Status: AC
  Administered 2016-01-23: 2 g via INTRAVENOUS
  Filled 2016-01-23: qty 50

## 2016-01-23 MED ORDER — LORAZEPAM 2 MG/ML IJ SOLN
1.0000 mg | Freq: Once | INTRAMUSCULAR | Status: AC
  Administered 2016-01-24: 1 mg via INTRAMUSCULAR
  Filled 2016-01-23: qty 1

## 2016-01-23 NOTE — Progress Notes (Signed)
Subjective: Patient reports feeling better. No flank pain. Has brisk UOP from nephrostomy tube. Appreciate urgent help by Dr Earleen Newport  Objective: Vital signs in last 24 hours: Temp:  [99 F (37.2 C)-100.9 F (38.3 C)] 99.4 F (37.4 C) (07/06 0510) Pulse Rate:  [95-107] 101 (07/06 0510) Resp:  [17-21] 17 (07/06 0510) BP: (135-162)/(46-58) 162/57 mmHg (07/06 0510) SpO2:  [95 %-98 %] 96 % (07/06 0510)  Intake/Output from previous day: 07/05 0701 - 07/06 0700 In: 3565.8 [P.O.:310; I.V.:3195.8; IV Piggyback:50] Out: 2423 [Urine:4225] Intake/Output this shift:    Physical Exam:  Constitutional: Vital signs reviewed. WD WN in NAD   Eyes: PERRL, No scleral icterus.   Pulmonary/Chest: Normal effort: Extremities: No cyanosis or edema   Lab Results:  Recent Labs  01/20/16 1859 01/21/16 0434 01/22/16 1242  HGB 7.6* 8.1* 7.4*  HCT 24.1* 24.7* 24.1*   BMET  Recent Labs  01/22/16 0723 01/23/16 0350  NA 136 140  K 4.4 4.7  CL 109 115*  CO2 18* 18*  GLUCOSE 140* 138*  BUN 38* 29*  CREATININE 3.44* 2.66*  CALCIUM 8.5* 9.1    Recent Labs  01/23/16 0350  INR 1.38   No results for input(s): LABURIN in the last 72 hours. Results for orders placed or performed during the hospital encounter of 01/21/16  C difficile quick scan w PCR reflex     Status: Abnormal   Collection Time: 01/22/16 11:48 AM  Result Value Ref Range Status   C Diff antigen POSITIVE (A) NEGATIVE Final   C Diff toxin POSITIVE (A) NEGATIVE Final   C Diff interpretation Toxin producing C. difficile detected.  Final    Comment: CRITICAL RESULT CALLED TO, READ BACK BY AND VERIFIED WITH: R. BUTLER RN 12:40 01/22/16 ([email or web address])     Studies/Results: Ir Nephrostomy Placement Left  01/22/2016  INDICATION: 75 year old female with a history of urosepsis and obstructed left ureterovesical junction secondary to tumor. Tumor was obstructing stent placement on cystoscopy. EXAM: IR NEPHROSTOMY PLACEMENT LEFT COMPARISON:   None. MEDICATIONS: Hospital administered antibiotics within 1 hour of procedure. ANESTHESIA/SEDATION: Fentanyl 25 mcg IV; Versed 0.5 mg IV Moderate Sedation Time:  15 The patient was continuously monitored during the procedure by the interventional radiology nurse under my direct supervision. CONTRAST:  10 cc - administered into the collecting system(s) FLUOROSCOPY TIME:  Fluoroscopy Time: 2 minutes 0 seconds (11 mGy). COMPLICATIONS: None PROCEDURE: Informed written consent was obtained from the patient's family after a thorough discussion of the procedural risks, benefits and alternatives. All questions were addressed. Maximal Sterile Barrier Technique was utilized including caps, mask, sterile gowns, sterile gloves, sterile drape, hand hygiene and skin antiseptic. A timeout was performed prior to the initiation of the procedure. Patient positioned prone position on the fluoroscopy table. Ultrasound survey of the left flank was performed with images stored and sent to PACs. The patient was then prepped and draped in the usual sterile fashion. 1% lidocaine was used to anesthetize the skin and subcutaneous tissues for local anesthesia. A Chiba needle was then used to access a posterior inferior calyx with ultrasound guidance. With spontaneous urine returned through the needle, passage of an 018 micro wire into the collecting system was performed under fluoroscopy. A small incision was made with an 11 blade scalpel, and the needle was removed from the wire. An Accustick system was then advanced over the wire into the collecting system under fluoroscopy. The metal stiffener and inner dilator were removed, and then a sample of fluid was aspirated  through the 4 [place] outer sheath. Bentson wire was passed into the collecting system and the sheath removed. Ten French dilation of the soft tissues was performed. Using modified Seldinger technique, a 10 French pigtail catheter drain was placed over the Bentson wire. Wire  and inner stiffener removed, and the pigtail was formed in the collecting system. Small amount of contrast confirmed position of the catheter. Patient tolerated the procedure well and remained hemodynamically stable throughout. No complications were encountered and no significant blood loss encountered IMPRESSION: Status post left percutaneous nephrostomy placement. Signed, Dulcy Fanny. Earleen Newport, DO [facility] Electronically Signed   By: Corrie Mckusick D.O.   On: 01/22/2016 07:50    Assessment/Plan:   1. 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 Bx sent--await results  2. 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 Can eventually be internalized w/ stent by int. Radiol;.--await culture results. Would rec doing this prior to d/c.  3. Post obstructive diuresis--expected. Would not chase this w/ increasing iv rate--ok to make iv sl or kvo--if pt taking po adequately.  4. Will eventually need TUR of bladder lesion as outpt once med situation stabilized   LOS: 2 days   Franchot Gallo M 01/23/2016, 7:02 AM

## 2016-01-23 NOTE — Progress Notes (Signed)
[place] responded to Spiritual Consult. Patient was emotional and needed to talk. Patients church (Which is in [place]) is very important to her and she has not been visited. Was wondering if they knew. She asked if I would call her husband and see. Will call and inform the patient of my findings. I am available for follow up.  Darene Lamer Max[place]illion Banas 10:14 AM    01/23/16 1000  Clinical Encounter Type  Visited With Patient;Health care provider  Visit Type Initial;Spiritual support;Social support  Referral From Nurse  Spiritual Encounters  Spiritual Needs Prayer;Emotional  Stress Factors  Patient Stress Factors Family relationships;Loss of control;Major life changes

## 2016-01-23 NOTE — Progress Notes (Signed)
[facility] Progress Note  Patient: Barbara [name] ZOX:[id number]   PCP: Madelyn Brunner, MD DOB: [birth year]   DOA: 01/21/2016   DOS: 01/23/2016   Date of Service: the patient was seen and examined on 01/23/2016  Subjective: diarrhea is still present but improving. No acute pain reported by patient. No shortness of breath. Oral intake continues to remain minimal. Nutrition: Tolerating oral diet with minimal oral intake  Brief hospital course: Pt. with PMH of COPD, DM 2, HTN, anemia; admitted on 01/21/2016, with complaint of fever with tachycardia, was found to have sepsis from probable pyelonephritis as well as C. difficile. Currently further plan is continue antibiotics.  Assessment and Plan: 1. Sepsis ([place]) Recent procedure with percutaneous nephrostomy tube placement. Patient is on IV cefepime. Blood cultures are also sent. C. difficile PCR is positive and and tinea vancomycin.14 day treatment course last day 02/05/2016. Lactic acid is stable. Renal function is also stable. We'll continue to monitor the patient closely on telemetry.  2. Acute on chronic kidney disease stage IV. Post obstructive diuresis. Holding ARB holding antihypertensive medication. Blood pressure elevated and therefore I would discontinue IV fluid as the patient is able to tolerate oral fluids adequately.  3. Insulin-dependent diabetes mellitus. Holding long-acting insulin. continue sliding scale insulin.  4. Urinary obstruction possible mass.  Status post percutaneous nephrostomy tube placement biopsies sent after procedure. Follow the culture. Appreciate urology input, recommend internalizing the nephrostomy tube prior to discharge.once the cultures are negative urology recommends to consult interventional radiology for the same. Will eventually need a TUR of bladder reason as an outpatient by urology  5. Essential hypertension. Currently blood pressure medications are on hold. We'll continue to  closely monitor.  6. COPD.  continue albuterol.  7. C. difficile by PCR. Currently on vancomycin.  Pain management: When necessary Tylenol Activity: Consulted physical therapy Bowel regimen: last BM 01/23/2016 Diet: Renal diet DVT Prophylaxis: subcutaneous Heparin  Advance goals of care discussion: Full code  Family Communication: no family was present at bedside, at the time of interview.   Disposition:  Discharge to likely SNF. Expected discharge date: 01/27/2016  Consultants: Urology, interventional radiology Procedures: IR guided nephrostomy placement on the left  Antibiotics: Anti-infectives    Start     Dose/Rate Route Frequency Ordered Stop   01/22/16 1400  vancomycin (VANCOCIN) 50 mg/mL oral solution 125 mg     125 mg Oral 4 times daily 01/22/16 1239 02/05/16 1359   01/21/16 2130  ceFEPIme (MAXIPIME) 2 g in dextrose 5 % 50 mL IVPB  Status:  Discontinued     2 g 100 mL/hr over 30 Minutes Intravenous  Once 01/21/16 2115 01/21/16 2116   01/21/16 2130  ceFEPIme (MAXIPIME) 1 g in dextrose 5 % 50 mL IVPB     1 g 100 mL/hr over 30 Minutes Intravenous Every 24 hours 01/21/16 2117     01/21/16 2000  cefTRIAXone (ROCEPHIN) 1 g in dextrose 5 % 50 mL IVPB  Status:  Discontinued     1 g 100 mL/hr over 30 Minutes Intravenous Every 24 hours 01/21/16 1936 01/21/16 2115   01/21/16 1945  cefTRIAXone (ROCEPHIN) 1 g in dextrose 5 % 50 mL IVPB  Status:  Discontinued    Comments:  Need dose at 2000 tonight   1 g 100 mL/hr over 30 Minutes Intravenous  Once 01/21/16 1938 01/21/16 1939        Intake/Output Summary (Last 24 hours) at 01/23/16 1847 Last data filed at 01/23/16 1836  Gross per 24 hour  Intake 3830.83 ml  Output   5225 ml  Net -1394.17 ml   Filed Weights   01/21/16 2032  Weight: 94.8 kg (208 lb 15.9 oz)    Objective: Physical Exam: Filed Vitals:   01/23/16 0321 01/23/16 0510 01/23/16 1446 01/23/16 1835  BP: 158/53 162/57 148/56 148/58  Pulse: 102 101 93 94    Temp: 99 F (37.2 C) 99.4 F (37.4 C) 98.5 F (36.9 C) 98 F (36.7 C)  TempSrc: Oral Oral Oral Oral  Resp: 18 17 18 18   Height:      Weight:      SpO2: 95% 96% 97% 98%    General: Alert, Awake and Oriented to Time, Place and Person. Appear in moderate distress Eyes: PERRL, Conjunctiva normal ENT: Oral Mucosa clear moist. Neck: difficult to assess JVD, no Abnormal Mass Or lumps Cardiovascular: S1 and S2 Present, no Murmur, Respiratory: Bilateral Air entry equal and Decreased, Clear to Auscultation, no Crackles, no wheezes Abdomen: Bowel Sound present, Soft and no tenderness Skin: no redness, no Rash  Extremities: no Pedal edema, no calf tenderness Neurologic: Grossly no focal neuro deficit. Bilaterally Equal motor strength  Data Reviewed: CBC:  Recent Labs Lab 01/20/16 1303 01/20/16 1859 01/21/16 0434 01/22/16 1242 01/23/16 0853  WBC 11.7* 12.1* 12.1* 14.6* 14.2*  NEUTROABS  --   --   --   --  11.5*  HGB 7.8* 7.6* 8.1* 7.4* 7.6*  HCT 24.0* 24.1* 24.7* 24.1* 25.5*  MCV 84.2 87.1 86.4 89.6 88.5  PLT 237 217 227 232 676   Basic Metabolic Panel:  Recent Labs Lab 01/20/16 1303 01/20/16 1859 01/21/16 0434 01/22/16 0723 01/23/16 0350  NA 138  --  139 136 140  K 4.2  --  3.4* 4.4 4.7  CL 107  --  109 109 115*  CO2 19*  --  20* 18* 18*  GLUCOSE 84  --  47* 140* 138*  BUN 60*  --  53* 38* 29*  CREATININE 4.89* 4.78* 4.34* 3.44* 2.66*  CALCIUM 8.6*  --  8.3* 8.5* 9.1  MG  --   --   --   --  1.4*    Liver Function Tests:  Recent Labs Lab 01/20/16 1303 01/23/16 0350  AST 17 17  ALT 13* 15  ALKPHOS 70 82  BILITOT 0.3 0.6  PROT 8.1 6.9  ALBUMIN 3.5 2.5*    Recent Labs Lab 01/20/16 1303  LIPASE 48   No results for input(s): AMMONIA in the last 168 hours. Coagulation Profile:  Recent Labs Lab 01/23/16 0350  INR 1.38   Cardiac Enzymes: No results for input(s): CKTOTAL, CKMB, CKMBINDEX, TROPONINI in the last 168 hours. BNP (last 3 results) No  results for input(s): PROBNP in the last 8760 hours.  CBG:  Recent Labs Lab 01/22/16 1623 01/22/16 2127 01/23/16 0739 01/23/16 1149 01/23/16 1706  GLUCAP 147* 127* 145* 133* 126*    Studies: No results found.   Scheduled Meds: . aspirin EC  81 mg Oral Daily  . atorvastatin  40 mg Oral Daily  . ceFEPime (MAXIPIME) IV  1 g Intravenous Q24H  . insulin aspart  0-15 Units Subcutaneous TID WC  . insulin aspart  0-5 Units Subcutaneous QHS  . vancomycin  125 mg Oral QID   Continuous Infusions:   PRN Meds: acetaminophen **OR** acetaminophen, albuterol, labetalol, ondansetron **OR** ondansetron (ZOFRAN) IV  Time spent: 30 minutes  Author: Berle Mull, MD [facility] Pager: [phone] 01/23/2016 6:47 PM  If 7PM-7AM, please contact night-coverage at [email or web address], password [email or web address]

## 2016-01-24 DIAGNOSIS — G9340 Encephalopathy, unspecified: Secondary | ICD-10-CM

## 2016-01-24 LAB — RETICULOCYTES
RBC.: 2.79 MIL/uL — AB (ref 3.87–5.11)
Retic Count, Absolute: 58.6 10*3/uL (ref 19.0–186.0)
Retic Ct Pct: 2.1 % (ref 0.4–3.1)

## 2016-01-24 LAB — CBC WITH DIFFERENTIAL/PLATELET
BASOS ABS: 0 10*3/uL (ref 0.0–0.1)
BASOS PCT: 0 %
EOS ABS: 0.2 10*3/uL (ref 0.0–0.7)
EOS PCT: 1 %
HCT: 24.3 % — ABNORMAL LOW (ref 36.0–46.0)
HEMOGLOBIN: 7.5 g/dL — AB (ref 12.0–15.0)
Lymphocytes Relative: 16 %
Lymphs Abs: 2 10*3/uL (ref 0.7–4.0)
MCH: 26.9 pg (ref 26.0–34.0)
MCHC: 30.9 g/dL (ref 30.0–36.0)
MCV: 87.1 fL (ref 78.0–100.0)
Monocytes Absolute: 0.8 10*3/uL (ref 0.1–1.0)
Monocytes Relative: 6 %
NEUTROS PCT: 77 %
Neutro Abs: 9.5 10*3/uL — ABNORMAL HIGH (ref 1.7–7.7)
PLATELETS: 276 10*3/uL (ref 150–400)
RBC: 2.79 MIL/uL — AB (ref 3.87–5.11)
RDW: 15.5 % (ref 11.5–15.5)
WBC: 12.5 10*3/uL — AB (ref 4.0–10.5)

## 2016-01-24 LAB — SURGICAL PATHOLOGY

## 2016-01-24 LAB — TYPE AND SCREEN
ABO/RH(D): O POS
Antibody Screen: NEGATIVE

## 2016-01-24 LAB — GLUCOSE, CAPILLARY
GLUCOSE-CAPILLARY: 133 mg/dL — AB (ref 65–99)
Glucose-Capillary: 137 mg/dL — ABNORMAL HIGH (ref 65–99)
Glucose-Capillary: 134 mg/dL — ABNORMAL HIGH (ref 65–99)
Glucose-Capillary: 131 mg/dL — ABNORMAL HIGH (ref 65–99)

## 2016-01-24 LAB — MAGNESIUM: MAGNESIUM: 1.9 mg/dL (ref 1.7–2.4)

## 2016-01-24 LAB — FIBRINOGEN: Fibrinogen: 748 mg/dL — ABNORMAL HIGH (ref 204–475)

## 2016-01-24 LAB — RENAL FUNCTION PANEL
ALBUMIN: 2.6 g/dL — AB (ref 3.5–5.0)
Anion gap: 7 (ref 5–15)
BUN: 21 mg/dL — ABNORMAL HIGH (ref 6–20)
CALCIUM: 9.5 mg/dL (ref 8.9–10.3)
CO2: 20 mmol/L — AB (ref 22–32)
CREATININE: 1.99 mg/dL — AB (ref 0.44–1.00)
Chloride: 111 mmol/L (ref 101–111)
GFR, EST AFRICAN AMERICAN: 27 mL/min — AB (ref >60)
GFR, EST NON AFRICAN AMERICAN: 23 mL/min — AB (ref >60)
Glucose, Bld: 138 mg/dL — ABNORMAL HIGH (ref 65–99)
PHOSPHORUS: 3 mg/dL (ref 2.5–4.6)
Potassium: 4 mmol/L (ref 3.5–5.1)
SODIUM: 138 mmol/L (ref 135–145)

## 2016-01-24 LAB — PROTIME-INR
INR: 1.38 (ref 0.00–1.49)
PROTHROMBIN TIME: 17.1 s — AB (ref 11.6–15.2)

## 2016-01-24 LAB — ABO/RH: ABO/RH(D): O POS

## 2016-01-24 MED ORDER — QUETIAPINE FUMARATE 50 MG PO TABS
25.0000 mg | ORAL_TABLET | Freq: Every day | ORAL | Status: DC
  Administered 2016-01-24 – 2016-01-26 (×3): 25 mg via ORAL
  Filled 2016-01-24 (×3): qty 1

## 2016-01-24 MED ORDER — HALOPERIDOL LACTATE 5 MG/ML IJ SOLN: 2.0000 mg | Freq: Four times a day (QID) | INTRAMUSCULAR | Status: DC | PRN

## 2016-01-24 MED ORDER — HALOPERIDOL LACTATE 5 MG/ML IJ SOLN
2.0000 mg | Freq: Once | INTRAMUSCULAR | Status: AC
  Administered 2016-01-24: 2 mg via INTRAMUSCULAR

## 2016-01-24 MED ORDER — HALOPERIDOL LACTATE 5 MG/ML IJ SOLN
INTRAMUSCULAR | Status: AC
  Filled 2016-01-24: qty 1

## 2016-01-24 MED ORDER — PIPERACILLIN-TAZOBACTAM 3.375 G IVPB
3.3750 g | Freq: Three times a day (TID) | INTRAVENOUS | Status: DC
  Administered 2016-01-24 (×2): 3.375 g via INTRAVENOUS
  Filled 2016-01-24 (×4): qty 50

## 2016-01-24 NOTE — Progress Notes (Signed)
Pt combative, swinging at nurses. Has  Been incontinent and needs to be cleaned up. MD was notified and order received for Haldol. Husband has also arrived and patient listens to him. Unsure if any history of dementia. After Haldol patient is calmer and more cooperative with husband still at bedside.

## 2016-01-24 NOTE — Progress Notes (Signed)
[facility] Progress Note  Patient: Barbara [name] LKG:[id number]   PCP: Madelyn Brunner, MD DOB: Sep 14, 1940   DOA: 01/21/2016   DOS: 01/24/2016   Date of Service: the patient was seen and examined on 01/24/2016  Subjective: Improvement in diarrhea but oral intake is minimal. This morning the patient is significantly confused but denies any acute complaint. Nutrition: Tolerating oral diet with minimal oral intake  Brief hospital course: Pt. with PMH of COPD, DM 2, HTN, anemia; admitted on 01/21/2016, with complaint of fever with tachycardia, was found to have sepsis from probable pyelonephritis as well as C. difficile. Pt now has developed delirium Currently further plan is continue antibiotics.  Assessment and Plan: 1. Sepsis ([place]) Recent procedure with percutaneous nephrostomy tube placement. Patient is on IV Zosyn. Cefepime discontinued due to concern for CNS toxicity Blood cultures are also sent. C. difficile PCR is positive and and tinea vancomycin.14 day treatment course last day 02/05/2016. Lactic acid is stable. Renal function is also stable. We'll continue to monitor the patient closely on telemetry.  2. Acute on chronic kidney disease stage IV. Post obstructive diuresis. Holding ARB holding antihypertensive medication. Blood pressure elevated and therefore I would discontinue IV fluid as the patient is able to tolerate oral fluids adequately.  3. Insulin-dependent diabetes mellitus. Holding long-acting insulin. continue sliding scale insulin.  4. Urinary obstruction possible mass.  Status post percutaneous nephrostomy tube placement biopsies sent after procedure. Follow the culture. Appreciate urology input, recommend internalizing the nephrostomy tube prior to discharge.once the cultures are negative urology recommends to consult interventional radiology for the same. Will eventually need a TUR of bladder reason as an outpatient by urology  5. Essential  hypertension. Currently blood pressure medications are on hold. We'll continue to closely monitor.  6. COPD.  continue albuterol.  7. C. difficile by PCR. Currently on vancomycin.  8. Acute encephalopathy. Possibly combination of delirium due to sepsis as well as cefepime and neurotoxicity. Discontinue cefepime transition the patient to Zosyn. Avoid benzodiazepine, we will use Haldol, add Seroquel  Pain management: When necessary Tylenol Activity: Consulted physical therapy Bowel regimen: last BM 01/24/2016 Diet: Renal diet DVT Prophylaxis: subcutaneous Heparin  Advance goals of care discussion: Full code  Family Communication: no family was present at bedside, at the time of interview.   Disposition:  Discharge to likely SNF. Expected discharge date: 01/27/2016  Consultants: Urology, interventional radiology Procedures: IR guided nephrostomy placement on the left  Antibiotics: Anti-infectives    Start     Dose/Rate Route Frequency Ordered Stop   01/24/16 1000  piperacillin-tazobactam (ZOSYN) IVPB 3.375 g     3.375 g 12.5 mL/hr over 240 Minutes Intravenous Every 8 hours 01/24/16 0936     01/22/16 1400  vancomycin (VANCOCIN) 50 mg/mL oral solution 125 mg     125 mg Oral 4 times daily 01/22/16 1239 02/05/16 1359   01/21/16 2130  ceFEPIme (MAXIPIME) 2 g in dextrose 5 % 50 mL IVPB  Status:  Discontinued     2 g 100 mL/hr over 30 Minutes Intravenous  Once 01/21/16 2115 01/21/16 2116   01/21/16 2130  ceFEPIme (MAXIPIME) 1 g in dextrose 5 % 50 mL IVPB  Status:  Discontinued     1 g 100 mL/hr over 30 Minutes Intravenous Every 24 hours 01/21/16 2117 01/24/16 0857   01/21/16 2000  cefTRIAXone (ROCEPHIN) 1 g in dextrose 5 % 50 mL IVPB  Status:  Discontinued     1 g 100 mL/hr over 30  Minutes Intravenous Every 24 hours 01/21/16 1936 01/21/16 2115   01/21/16 1945  cefTRIAXone (ROCEPHIN) 1 g in dextrose 5 % 50 mL IVPB  Status:  Discontinued    Comments:  Need dose at 2000 tonight     1 g 100 mL/hr over 30 Minutes Intravenous  Once 01/21/16 1938 01/21/16 1939        Intake/Output Summary (Last 24 hours) at 01/24/16 1941 Last data filed at 01/24/16 1800  Gross per 24 hour  Intake   1025 ml  Output   3350 ml  Net  -2325 ml   Filed Weights   01/21/16 2032  Weight: 94.8 kg (208 lb 15.9 oz)    Objective: Physical Exam: Filed Vitals:   01/24/16 0549 01/24/16 1025 01/24/16 1426 01/24/16 1904  BP: 142/59 148/60 146/53 147/58  Pulse: 88 87 92 89  Temp: 98.2 F (36.8 C) 97.9 F (36.6 C) 98 F (36.7 C) 98.3 F (36.8 C)  TempSrc: Oral Oral Oral Oral  Resp: 17 18 18 18   Height:      Weight:      SpO2: 95% 97% 97% 98%    General: Alert, Awake and Oriented to Time, Place and Person. Appear in moderate distress Eyes: PERRL, Conjunctiva normal ENT: Oral Mucosa clear moist. Neck: difficult to assess JVD, no Abnormal Mass Or lumps Cardiovascular: S1 and S2 Present, no Murmur, Respiratory: Bilateral Air entry equal and Decreased, Clear to Auscultation, no Crackles, no wheezes Abdomen: Bowel Sound present, Soft and no tenderness Skin: no redness, no Rash  Extremities: no Pedal edema, no calf tenderness Neurologic: Grossly no focal neuro deficit. Bilaterally Equal motor strength  Data Reviewed: CBC:  Recent Labs Lab 01/20/16 1859 01/21/16 0434 01/22/16 1242 01/23/16 0853 01/24/16 0512  WBC 12.1* 12.1* 14.6* 14.2* 12.5*  NEUTROABS  --   --   --  11.5* 9.5*  HGB 7.6* 8.1* 7.4* 7.6* 7.5*  HCT 24.1* 24.7* 24.1* 25.5* 24.3*  MCV 87.1 86.4 89.6 88.5 87.1  PLT 217 227 232 235 194   Basic Metabolic Panel:  Recent Labs Lab 01/20/16 1303 01/20/16 1859 01/21/16 0434 01/22/16 0723 01/23/16 0350 01/24/16 0512  NA 138  --  139 136 140 138  K 4.2  --  3.4* 4.4 4.7 4.0  CL 107  --  109 109 115* 111  CO2 19*  --  20* 18* 18* 20*  GLUCOSE 84  --  47* 140* 138* 138*  BUN 60*  --  53* 38* 29* 21*  CREATININE 4.89* 4.78* 4.34* 3.44* 2.66* 1.99*  CALCIUM  8.6*  --  8.3* 8.5* 9.1 9.5  MG  --   --   --   --  1.4* 1.9  PHOS  --   --   --   --   --  3.0    Liver Function Tests:  Recent Labs Lab 01/20/16 1303 01/23/16 0350 01/24/16 0512  AST 17 17  --   ALT 13* 15  --   ALKPHOS 70 82  --   BILITOT 0.3 0.6  --   PROT 8.1 6.9  --   ALBUMIN 3.5 2.5* 2.6*    Recent Labs Lab 01/20/16 1303  LIPASE 48   No results for input(s): AMMONIA in the last 168 hours. Coagulation Profile:  Recent Labs Lab 01/23/16 0350 01/24/16 0512  INR 1.38 1.38   Cardiac Enzymes: No results for input(s): CKTOTAL, CKMB, CKMBINDEX, TROPONINI in the last 168 hours. BNP (last 3 results) No results for input(s):  PROBNP in the last 8760 hours.  CBG:  Recent Labs Lab 01/23/16 1706 01/23/16 2206 01/24/16 0742 01/24/16 1147 01/24/16 1614  GLUCAP 126* 130* 137* 134* 133*    Studies: No results found.   Scheduled Meds: . aspirin EC  81 mg Oral Daily  . atorvastatin  40 mg Oral Daily  . insulin aspart  0-15 Units Subcutaneous TID WC  . insulin aspart  0-5 Units Subcutaneous QHS  . piperacillin-tazobactam (ZOSYN)  IV  3.375 g Intravenous Q8H  . QUEtiapine  25 mg Oral QHS  . vancomycin  125 mg Oral QID   Continuous Infusions:   PRN Meds: acetaminophen **OR** acetaminophen, albuterol, haloperidol lactate, labetalol, ondansetron **OR** ondansetron (ZOFRAN) IV  Time spent: 30 minutes  Author: Berle Mull, MD [facility] Pager: [phone] 01/24/2016 7:41 PM  If 7PM-7AM, please contact night-coverage at [email or web address], password [email or web address]

## 2016-01-24 NOTE — Progress Notes (Signed)
Pharmacy Antibiotic Note  Barbara [name] is a 75 y.o. female who presented on 01/21/2016 after decompensating s/p a cystoscopy with biopsy - unable to place a L-ureteral stent - the patient presented with hypotension, tachycardia, fever, and confusion and underwent a urgent perc nephrostomy tube placement by IR. Pharmacy originally consulted to dose Cefepime for r/o sepsis and now to transition to Zosyn.   The patient is noted to have outpatient blood and urine cultures - which likely is the reason for the antibiotic adjustment this AM. The patient is CDiff + and is on po Vancomycin. Treatment should be extended for at least 7 days after the stop of empiric antibiotics.   Plan: 1. Start Zosyn 3.375g IV every 8 hours (infused over 4 hours) 2. Consider extending treatment for CDiff with po Vancomycin for 7 days after the stop of broad spectrum antibiotics 3. Will continue to follow renal function, culture results, LOT, and antibiotic de-escalation plans   Height: 5' 8"  (172.7 cm) Weight: 208 lb 15.9 oz (94.8 kg) IBW/kg (Calculated) : 63.9  Temp (24hrs), Avg:98.2 F (36.8 C), Min:98 F (36.7 C), Max:98.5 F (36.9 C)   Recent Labs Lab 01/20/16 1859 01/21/16 0434 01/21/16 2152 01/22/16 0723 01/22/16 1242 01/23/16 0350 01/23/16 0853 01/24/16 0512  WBC 12.1* 12.1*  --   --  14.6*  --  14.2* 12.5*  CREATININE 4.78* 4.34*  --  3.44*  --  2.66*  --  1.99*  LATICACIDVEN  --   --  1.1  --   --   --   --   --     Estimated Creatinine Clearance: 29 mL/min (by C-G formula based on Cr of 1.99).    Allergies  Allergen Reactions  . Percocet [Oxycodone-Acetaminophen] Hives    Antimicrobials this admission: CTX 7/3 >> 7/4 Cefepime 7/4 >> 7/7 Vanc po (CDiff) 7/5 >> Zosyn 7/7 >>  Dose adjustments this admission: n/a  Microbiology results: Outpatient cultures: BCx/UCx >> unknown results  [facility] cultures:  7/5 CDiff >> antigen/toxin positive >> + CDiff 7/4 UCx >> NG  Thank you for  allowing pharmacy to be a part of this patient's care.  Alycia Rossetti, PharmD, BCPS Clinical Pharmacist Pager: [phone] 01/24/2016 9:46 AM

## 2016-01-24 NOTE — Progress Notes (Signed)
Pt. Had a watery BM but refused [place] to clean her up. Pt. Got a bit violent with [place] everytime we will tell her we need it to clean her up. Pt. Has a foley and we inform we needed to clean her around that  Area and she did not let us touch her at all. We  Called our charged nurse to help but pt. Got more violent. Pt. Refused to cleaned up and foley care. Charge RN said she will contact PT. Doctor

## 2016-01-24 NOTE — Clinical Social Work Note (Signed)
CSW received consult for patient needing SNF.  CSW awaiting PT recommendations and orders, CSW to continue to follow patient's progress throughout discharge planning.  Jones Broom. [place], MSW, [place] 01/24/2016 6:59 PM

## 2016-01-24 NOTE — Progress Notes (Signed)
   01/24/16 0028  BiPAP/CPAP/SIPAP  BiPAP/CPAP/SIPAP Pt Type Adult  Mask Type Full face mask  Mask Size Medium  Set Rate 0 breaths/min  Respiratory Rate 16 breaths/min  IPAP (I Max= 13)  EPAP (E Min= 4)  Oxygen Percent 28 %  Flow Rate 2 lpm  BiPAP/CPAP/SIPAP CPAP  Patient Home Equipment No  Auto Titrate Yes

## 2016-01-25 LAB — COMPREHENSIVE METABOLIC PANEL
ALT: 36 U/L (ref 14–54)
ANION GAP: 9 (ref 5–15)
AST: 36 U/L (ref 15–41)
Albumin: 2.6 g/dL — ABNORMAL LOW (ref 3.5–5.0)
Alkaline Phosphatase: 83 U/L (ref 38–126)
BUN: 22 mg/dL — ABNORMAL HIGH (ref 6–20)
CHLORIDE: 108 mmol/L (ref 101–111)
CO2: 21 mmol/L — ABNORMAL LOW (ref 22–32)
Calcium: 9.7 mg/dL (ref 8.9–10.3)
Creatinine, Ser: 1.92 mg/dL — ABNORMAL HIGH (ref 0.44–1.00)
GFR, EST AFRICAN AMERICAN: 28 mL/min — AB (ref >60)
GFR, EST NON AFRICAN AMERICAN: 24 mL/min — AB (ref >60)
Glucose, Bld: 134 mg/dL — ABNORMAL HIGH (ref 65–99)
POTASSIUM: 3.9 mmol/L (ref 3.5–5.1)
Sodium: 138 mmol/L (ref 135–145)
Total Bilirubin: 0.4 mg/dL (ref 0.3–1.2)
Total Protein: 8.9 g/dL — ABNORMAL HIGH (ref 6.5–8.1)

## 2016-01-25 LAB — CULTURE, BLOOD (ROUTINE X 2)
CULTURE: NO GROWTH
CULTURE: NO GROWTH

## 2016-01-25 LAB — GLUCOSE, CAPILLARY
GLUCOSE-CAPILLARY: 135 mg/dL — AB (ref 65–99)
Glucose-Capillary: 127 mg/dL — ABNORMAL HIGH (ref 65–99)
Glucose-Capillary: 145 mg/dL — ABNORMAL HIGH (ref 65–99)
Glucose-Capillary: 144 mg/dL — ABNORMAL HIGH (ref 65–99)

## 2016-01-25 LAB — CBC WITH DIFFERENTIAL/PLATELET
Basophils Absolute: 0 10*3/uL (ref 0.0–0.1)
Basophils Relative: 0 %
Eosinophils Absolute: 0.3 10*3/uL (ref 0.0–0.7)
Eosinophils Relative: 3 %
HCT: 27 % — ABNORMAL LOW (ref 36.0–46.0)
Hemoglobin: 8.3 g/dL — ABNORMAL LOW (ref 12.0–15.0)
Lymphocytes Relative: 19 %
Lymphs Abs: 1.8 10*3/uL (ref 0.7–4.0)
MCH: 26.9 pg (ref 26.0–34.0)
MCHC: 30.7 g/dL (ref 30.0–36.0)
MCV: 87.7 fL (ref 78.0–100.0)
Monocytes Absolute: 1 10*3/uL (ref 0.1–1.0)
Monocytes Relative: 10 %
Neutro Abs: 6.4 10*3/uL (ref 1.7–7.7)
Neutrophils Relative %: 68 %
Platelets: 326 10*3/uL (ref 150–400)
RBC: 3.08 MIL/uL — ABNORMAL LOW (ref 3.87–5.11)
RDW: 15.4 % (ref 11.5–15.5)
WBC: 9.5 10*3/uL (ref 4.0–10.5)

## 2016-01-25 LAB — RENAL FUNCTION PANEL
ANION GAP: 9 (ref 5–15)
Albumin: 2.6 g/dL — ABNORMAL LOW (ref 3.5–5.0)
BUN: 22 mg/dL — ABNORMAL HIGH (ref 6–20)
CALCIUM: 9.8 mg/dL (ref 8.9–10.3)
CHLORIDE: 108 mmol/L (ref 101–111)
CO2: 23 mmol/L (ref 22–32)
Creatinine, Ser: 1.93 mg/dL — ABNORMAL HIGH (ref 0.44–1.00)
GFR calc non Af Amer: 24 mL/min — ABNORMAL LOW (ref >60)
GFR, EST AFRICAN AMERICAN: 28 mL/min — AB (ref >60)
Glucose, Bld: 133 mg/dL — ABNORMAL HIGH (ref 65–99)
Phosphorus: 3.7 mg/dL (ref 2.5–4.6)
Potassium: 4 mmol/L (ref 3.5–5.1)
Sodium: 140 mmol/L (ref 135–145)

## 2016-01-25 LAB — VITAMIN B12: Vitamin B-12: 311 pg/mL (ref 180–914)

## 2016-01-25 LAB — TSH: TSH: 1.292 u[IU]/mL (ref 0.350–4.500)

## 2016-01-25 LAB — MAGNESIUM: Magnesium: 1.9 mg/dL (ref 1.7–2.4)

## 2016-01-25 LAB — IRON AND TIBC
IRON: 25 ug/dL — AB (ref 28–170)
Saturation Ratios: 15 % (ref 10.4–31.8)
TIBC: 172 ug/dL — ABNORMAL LOW (ref 250–450)
UIBC: 147 ug/dL

## 2016-01-25 LAB — LACTIC ACID, PLASMA: Lactic Acid, Venous: 0.9 mmol/L (ref 0.5–1.9)

## 2016-01-25 LAB — PROTIME-INR
INR: 1.23 (ref 0.00–1.49)
Prothrombin Time: 15.6 seconds — ABNORMAL HIGH (ref 11.6–15.2)

## 2016-01-25 LAB — AMMONIA: Ammonia: 17 umol/L (ref 9–35)

## 2016-01-25 LAB — FOLATE: Folate: 10.2 ng/mL (ref >5.9)

## 2016-01-25 MED ORDER — HEPARIN SODIUM (PORCINE) 5000 UNIT/ML IJ SOLN
5000.0000 [IU] | Freq: Three times a day (TID) | INTRAMUSCULAR | Status: DC
  Administered 2016-01-25 – 2016-01-27 (×7): 5000 [IU] via SUBCUTANEOUS
  Filled 2016-01-25 (×5): qty 1

## 2016-01-25 MED ORDER — AMLODIPINE BESYLATE 5 MG PO TABS
5.0000 mg | ORAL_TABLET | Freq: Every day | ORAL | Status: DC
  Administered 2016-01-25 – 2016-01-27 (×3): 5 mg via ORAL
  Filled 2016-01-25 (×3): qty 1

## 2016-01-25 MED ORDER — FOLIC ACID 1 MG PO TABS
1.0000 mg | ORAL_TABLET | Freq: Every day | ORAL | Status: DC
  Administered 2016-01-25 – 2016-01-27 (×3): 1 mg via ORAL
  Filled 2016-01-25 (×3): qty 1

## 2016-01-25 MED ORDER — VITAMIN B-12 1000 MCG PO TABS
1000.0000 ug | ORAL_TABLET | Freq: Every day | ORAL | Status: DC
  Administered 2016-01-26 – 2016-01-27 (×2): 1000 ug via ORAL
  Filled 2016-01-25 (×3): qty 1

## 2016-01-25 MED ORDER — MEGESTROL ACETATE 400 MG/10ML PO SUSP
400.0000 mg | Freq: Every day | ORAL | Status: DC
  Administered 2016-01-25 – 2016-01-27 (×3): 400 mg via ORAL
  Filled 2016-01-25 (×3): qty 10

## 2016-01-25 MED ORDER — CYANOCOBALAMIN 1000 MCG/ML IJ SOLN
1000.0000 ug | Freq: Once | INTRAMUSCULAR | Status: AC
  Administered 2016-01-25: 1000 ug via SUBCUTANEOUS
  Filled 2016-01-25: qty 1

## 2016-01-25 MED ORDER — FERROUS SULFATE 325 (65 FE) MG PO TABS
325.0000 mg | ORAL_TABLET | Freq: Two times a day (BID) | ORAL | Status: DC
  Administered 2016-01-25 – 2016-01-27 (×5): 325 mg via ORAL
  Filled 2016-01-25 (×5): qty 1

## 2016-01-25 MED ORDER — FAMOTIDINE 20 MG PO TABS
20.0000 mg | ORAL_TABLET | Freq: Every day | ORAL | Status: DC
  Administered 2016-01-25 – 2016-01-27 (×3): 20 mg via ORAL
  Filled 2016-01-25 (×3): qty 1

## 2016-01-25 MED ORDER — AMOXICILLIN-POT CLAVULANATE 500-125 MG PO TABS
1.0000 | ORAL_TABLET | Freq: Three times a day (TID) | ORAL | Status: DC
  Administered 2016-01-25 – 2016-01-26 (×2): 500 mg via ORAL
  Filled 2016-01-25 (×4): qty 1

## 2016-01-25 NOTE — Progress Notes (Signed)
Patient refused to wear CPAP tonight. She became very combative when I attempted to put it on her.

## 2016-01-25 NOTE — Progress Notes (Signed)
[facility] Progress Note  Patient: Barbara [name] JAS:[id number]   PCP: Madelyn Brunner, MD DOB: May 03, 1941   DOA: 01/21/2016   DOS: 01/25/2016   Date of Service: the patient was seen and examined on 01/25/2016  Subjective: Mentation significantly improved and back to baseline. Denies any acute complaint. No nausea no vomiting.  Nutrition: Tolerating oral diet with minimal oral intake  Brief hospital course: Pt. with PMH of COPD, DM 2, HTN, anemia; admitted on 01/21/2016, with complaint of fever with tachycardia, was found to have sepsis from probable pyelonephritis as well as C. difficile. Pt now has developed delirium, probably from cefepime currently resolved. 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 Culture from the nephrostomy procedure is also negative. Currently further plan is continue antibiotics.  Assessment and Plan: 1. Sepsis ([place]) from UTI as well as C. Difficile. Developed septic picture after Recent procedure with percutaneous nephrostomy tube placement. Patient is on IV Zosyn changed to Augmentin. Cefepime discontinued due to concern for CNS toxicity. Last day 01/28/2016. Blood cultures are negative from 01/20/2016. C. difficile PCR is positive, continue vancomycin.14 day treatment course last day 02/05/2016. Diarrhea is getting better. Lactic acid is stable. Renal function is also stable.  2. Acute on chronic kidney disease stage IV. Post obstructive diuresis. Discontinue ARB. At present renal function appears to be getting closer to baseline. Poor oral intake can precipitate acute kidney injury again in future.  3. Insulin-dependent diabetes mellitus. Holding long-acting insulin. While the patient remains with poor oral intake continue sliding scale insulin.  4. 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 Appreciate urology input, recommend internalizing the nephrostomy tube prior to discharge. I will discuss with neurology regarding further plan  5. Essential hypertension. Resume amlodipine  6. COPD.  continue albuterol.  7. C. difficile by PCR. Currently on vancomycin.  8. Acute encephalopathy. Likely from cefepime causing CNS toxicity, currently resolved Possibly combination of delirium due to sepsis as well as cefepime and neurotoxicity. Discontinue cefepime transition the patient to Zosyn. Avoid benzodiazepine, we will use Haldol, add Seroquel  9. Iron deficiency anemia. H&H remains stable and actually getting better. Low iron, low normal A-19 and folic acid level, normal reticulocyte count, normal fibrinogen, normal INR. We'll start the patient on iron supplementation F-79 as well as folic acid supplementation.  We will monitor.  Pain management: When necessary Tylenol Activity: Consulted physical therapy Bowel regimen: last BM 01/24/2016 Diet: Cardiac carb modified diet DVT Prophylaxis: subcutaneous Heparin  Advance goals of care discussion: Full code  Family Communication: no family was present at bedside, at the time of interview.   Disposition:  Discharge to likely SNF. Expected discharge date: 01/27/2016  Consultants: Urology, interventional radiology Procedures: IR guided nephrostomy placement on the left  Antibiotics: Anti-infectives    Start     Dose/Rate Route Frequency Ordered Stop   01/25/16 1400  amoxicillin-clavulanate (AUGMENTIN) 500-125 MG per tablet 500 mg     1 tablet Oral Every 8 hours 01/25/16 1031     01/24/16 1000  piperacillin-tazobactam (ZOSYN) IVPB 3.375 g  Status:  Discontinued     3.375 g 12.5 mL/hr over 240 Minutes Intravenous Every 8 hours 01/24/16 0936 01/25/16 1030   01/22/16 1400  vancomycin (VANCOCIN) 50 mg/mL oral solution 125 mg     125 mg Oral 4 times daily 01/22/16 1239 02/05/16 1359    01/21/16 2130  ceFEPIme (MAXIPIME)  2 g in dextrose 5 % 50 mL IVPB  Status:  Discontinued     2 g 100 mL/hr over 30 Minutes Intravenous  Once 01/21/16 2115 01/21/16 2116   01/21/16 2130  ceFEPIme (MAXIPIME) 1 g in dextrose 5 % 50 mL IVPB  Status:  Discontinued     1 g 100 mL/hr over 30 Minutes Intravenous Every 24 hours 01/21/16 2117 01/24/16 0857   01/21/16 2000  cefTRIAXone (ROCEPHIN) 1 g in dextrose 5 % 50 mL IVPB  Status:  Discontinued     1 g 100 mL/hr over 30 Minutes Intravenous Every 24 hours 01/21/16 1936 01/21/16 2115   01/21/16 1945  cefTRIAXone (ROCEPHIN) 1 g in dextrose 5 % 50 mL IVPB  Status:  Discontinued    Comments:  Need dose at 2000 tonight   1 g 100 mL/hr over 30 Minutes Intravenous  Once 01/21/16 1938 01/21/16 1939        Intake/Output Summary (Last 24 hours) at 01/25/16 1121 Last data filed at 01/25/16 0935  Gross per 24 hour  Intake   1030 ml  Output   2425 ml  Net  -1395 ml   Filed Weights   01/21/16 2032  Weight: 94.8 kg (208 lb 15.9 oz)    Objective: Physical Exam: Filed Vitals:   01/24/16 1904 01/24/16 2245 01/25/16 0616 01/25/16 0940  BP: 147/58 158/50 157/66 165/65  Pulse: 89 94 96 91  Temp: 98.3 F (36.8 C) 98.6 F (37 C) 98.1 F (36.7 C) 99.1 F (37.3 C)  TempSrc: Oral Oral Oral Axillary  Resp: 18 17 18 17   Height:      Weight:      SpO2: 98% 100% 97% 94%    General: Alert, Awake and Oriented to Time, Place and Person. Appear in mild distress Eyes: PERRL, Conjunctiva normal ENT: Oral Mucosa clear moist. Neck: difficult to assess JVD, no Abnormal Mass Or lumps Cardiovascular: S1 and S2 Present, no Murmur, Respiratory: Bilateral Air entry equal and Decreased, Clear to Auscultation, no Crackles, no wheezes Abdomen: Bowel Sound present, Soft and no tenderness Skin: no redness, no Rash  Extremities: no Pedal edema, no calf tenderness Neurologic: Grossly no focal neuro deficit. Bilaterally Equal motor strength  Data  Reviewed: CBC:  Recent Labs Lab 01/21/16 0434 01/22/16 1242 01/23/16 0853 01/24/16 0512 01/25/16 0553  WBC 12.1* 14.6* 14.2* 12.5* 9.5  NEUTROABS  --   --  11.5* 9.5* 6.4  HGB 8.1* 7.4* 7.6* 7.5* 8.3*  HCT 24.7* 24.1* 25.5* 24.3* 27.0*  MCV 86.4 89.6 88.5 87.1 87.7  PLT 227 232 235 276 852   Basic Metabolic Panel:  Recent Labs Lab 01/21/16 0434 01/22/16 0723 01/23/16 0350 01/24/16 0512 01/25/16 0553  NA 139 136 140 138 138  140  K 3.4* 4.4 4.7 4.0 3.9  4.0  CL 109 109 115* 111 108  108  CO2 20* 18* 18* 20* 21*  23  GLUCOSE 47* 140* 138* 138* 134*  133*  BUN 53* 38* 29* 21* 22*  22*  CREATININE 4.34* 3.44* 2.66* 1.99* 1.92*  1.93*  CALCIUM 8.3* 8.5* 9.1 9.5 9.7  9.8  MG  --   --  1.4* 1.9 1.9  PHOS  --   --   --  3.0 3.7    Liver Function Tests:  Recent Labs Lab 01/20/16 1303 01/23/16 0350 01/24/16 0512 01/25/16 0553  AST 17 17  --  36  ALT 13* 15  --  36  ALKPHOS 70 82  --  83  BILITOT 0.3 0.6  --  0.4  PROT 8.1 6.9  --  8.9*  ALBUMIN 3.5 2.5* 2.6* 2.6*  2.6*    Recent Labs Lab 01/20/16 1303  LIPASE 48    Recent Labs Lab 01/25/16 0553  AMMONIA 17   Coagulation Profile:  Recent Labs Lab 01/23/16 0350 01/24/16 0512 01/25/16 0553  INR 1.38 1.38 1.23   Cardiac Enzymes: No results for input(s): CKTOTAL, CKMB, CKMBINDEX, TROPONINI in the last 168 hours. BNP (last 3 results) No results for input(s): PROBNP in the last 8760 hours.  CBG:  Recent Labs Lab 01/24/16 0742 01/24/16 1147 01/24/16 1614 01/24/16 2245 01/25/16 0743  GLUCAP 137* 134* 133* 131* 135*    Studies: No results found.   Scheduled Meds: . amLODipine  5 mg Oral Daily  . amoxicillin-clavulanate  1 tablet Oral Q8H  . aspirin EC  81 mg Oral Daily  . atorvastatin  40 mg Oral Daily  . cyanocobalamin  1,000 mcg Subcutaneous Once  . famotidine  20 mg Oral Daily  . ferrous sulfate  325 mg Oral BID WC  . folic acid  1 mg Oral Daily  . insulin aspart  0-15  Units Subcutaneous TID WC  . insulin aspart  0-5 Units Subcutaneous QHS  . megestrol  400 mg Oral Daily  . QUEtiapine  25 mg Oral QHS  . vancomycin  125 mg Oral QID  . [START ON 01/26/2016] vitamin B-12  1,000 mcg Oral Daily   Continuous Infusions:   PRN Meds: acetaminophen **OR** acetaminophen, albuterol, haloperidol lactate, labetalol, ondansetron **OR** ondansetron (ZOFRAN) IV  Time spent: 30 minutes  Author: Berle Mull, MD [facility] Pager: [phone] 01/25/2016 11:21 AM  If 7PM-7AM, please contact night-coverage at [email or web address], password [facility]

## 2016-01-25 NOTE — Evaluation (Signed)
Physical Therapy Evaluation Patient Details Name: Oneida Mckamey MRN: [id number] DOB: 1941-04-10 Today's Date: 01/25/2016   History of Present Illness  Pt admitted with sepsis from UTI and C-diff. Pr with PMH signifacant for: CKD, OA, DM, urinary retention, urothelial cancer, HTN, COPD  Clinical Impression  Pt admitted with above diagnosis. Pt currently with functional limitations due to the deficits listed below (see PT Problem List).  Pt will benefit from skilled PT to increase their independence and safety with mobility.  Pt with multiple comorbidities and evolving status. Recommend SNF at [place] for ongoing PT. Pt refused to transfer OOB to chair but would not give a reason why she would not do it.       Follow Up Recommendations Supervision/Assistance - 24 hour;SNF    Equipment Recommendations  None recommended by PT    Recommendations for Other Services OT consult     Precautions / Restrictions Precautions Precautions: Fall Restrictions Weight Bearing Restrictions: No      Mobility  Bed Mobility Overal bed mobility: Needs Assistance Bed Mobility: Rolling;Supine to Sit;Sit to Supine Rolling: Min assist   Supine to sit: Mod assist Sit to supine: Min assist   General bed mobility comments: pt needed assist with RLE and upper body  Transfers Overall transfer level:  (Pt refused to attempt)                  Ambulation/Gait                Stairs            Wheelchair Mobility    Modified Rankin (Stroke Patients Only)       Balance                                             Pertinent Vitals/Pain Pain Assessment: Faces Faces Pain Scale: Hurts whole lot Pain Location: right leg-all over.  Could not narrow down where it was hurting Pain Intervention(s): Limited activity within patient's tolerance;Monitored during session    E[place] expects to be discharged to:: Private residence Living Arrangements:  Spouse/significant other Available Help at Discharge: Other (Comment) (No family ) Type of Home: House Home Access: Stairs to enter Entrance Stairs-Rails: None Entrance Stairs-Number of Steps: 1 Home Layout: One level Home Equipment: Walker - 2 wheels;Cane - single point Additional Comments: Has a [place] aide to come a few times per week.  All this info taken from previous admission as pt could nnot or would not articulate this info.    Prior Function Level of Independence: Needs assistance   Gait / Transfers Assistance Needed: Pt reports ambulating at home with cane PTA            Hand Dominance        Extremity/Trunk Assessment   Upper Extremity Assessment: Defer to OT evaluation           Lower Extremity Assessment: Generalized weakness;RLE deficits/detail      Cervical / Trunk Assessment: Kyphotic (in sitting maintained kyphotic posture)  Communication   Communication: Other (comment) (Hard to understand her speech and needed to repeat herself)  Cognition Arousal/Alertness: Awake/alert Behavior During Therapy: Flat affect Overall Cognitive Status: No family/caregiver present to determine baseline cognitive functioning                      General Comments  General comments (skin integrity, edema, etc.): No family present. Pt had periods where she wouldn't annswer questions and pther times would immediately answer.  Seemed to have difficulty with more complex questions.      Exercises        Assessment/Plan    PT Assessment Patient needs continued PT services  PT Diagnosis Generalized weakness;Acute pain   PT Problem List Decreased strength;Decreased range of motion;Decreased activity tolerance;Decreased balance;Decreased mobility;Decreased knowledge of use of DME;Pain;Obesity  PT Treatment Interventions DME instruction;Gait training;Therapeutic activities;Therapeutic exercise;Balance training;Patient/family education   PT Goals (Current goals can be  found in the Care Plan section) Acute Rehab PT Goals Patient Stated Goal: pt would not state goal PT Goal Formulation: Patient unable to participate in goal setting Time For Goal Achievement: 02/01/16 Potential to Achieve Goals: Fair    Frequency Min 2X/week   Barriers to discharge        Co-evaluation               End of Session Equipment Utilized During Treatment: Gait belt Activity Tolerance: Patient limited by pain Patient left: in bed;with bed alarm set;with call bell/phone within reach Nurse Communication: Mobility status         Time: 5800-6349 PT Time Calculation (min) (ACUTE ONLY): 24 min   Charges:   PT Evaluation $PT Eval Moderate Complexity: 1 Procedure PT Treatments $Therapeutic Activity: 8-22 mins   PT G Codes:        Melvern Banker 01/25/2016, 2:52 PM  Lavonia Dana, [place] 01/25/2016

## 2016-01-25 NOTE — Progress Notes (Signed)
Placed patient on CPAP for the night via auto-mode with minimum pressure set at 5cm and maximum pressure set at 20cm. Oxygen set at 2lpm with SP02=95%

## 2016-01-26 LAB — RENAL FUNCTION PANEL
ALBUMIN: 2.7 g/dL — AB (ref 3.5–5.0)
ANION GAP: 9 (ref 5–15)
BUN: 27 mg/dL — ABNORMAL HIGH (ref 6–20)
CALCIUM: 9.8 mg/dL (ref 8.9–10.3)
CO2: 21 mmol/L — AB (ref 22–32)
CREATININE: 1.98 mg/dL — AB (ref 0.44–1.00)
Chloride: 109 mmol/L (ref 101–111)
GFR, EST AFRICAN AMERICAN: 27 mL/min — AB (ref >60)
GFR, EST NON AFRICAN AMERICAN: 23 mL/min — AB (ref >60)
Glucose, Bld: 133 mg/dL — ABNORMAL HIGH (ref 65–99)
PHOSPHORUS: 3.7 mg/dL (ref 2.5–4.6)
Potassium: 3.8 mmol/L (ref 3.5–5.1)
SODIUM: 139 mmol/L (ref 135–145)

## 2016-01-26 LAB — GLUCOSE, CAPILLARY
GLUCOSE-CAPILLARY: 141 mg/dL — AB (ref 65–99)
GLUCOSE-CAPILLARY: 152 mg/dL — AB (ref 65–99)
GLUCOSE-CAPILLARY: 119 mg/dL — AB (ref 65–99)
Glucose-Capillary: 131 mg/dL — ABNORMAL HIGH (ref 65–99)

## 2016-01-26 MED ORDER — AMOXICILLIN-POT CLAVULANATE 500-125 MG PO TABS
1.0000 | ORAL_TABLET | Freq: Two times a day (BID) | ORAL | Status: DC
  Administered 2016-01-26 – 2016-01-27 (×2): 500 mg via ORAL
  Filled 2016-01-26 (×2): qty 1

## 2016-01-26 NOTE — Clinical Social Work Note (Signed)
Clinical Social Work Assessment  Patient Details  Name: Barbara [name] MRN: [id number] Date of Birth: 1941/05/05  Date of referral:  01/26/16               Reason for consult:  Discharge Planning                Permission sought to share information with:  Case Manager, Facility Sport and exercise psychologist, Family Supports Permission granted to share information::  Yes, Verbal Permission Granted  Name::        Agency::   (SNF)  Relationship::     Contact Information:     Housing/Transportation Living arrangements for the past 2 months:  Single Family Home Source of Information:  Patient, Medical Team, Spouse Patient Interpreter Needed:  None Criminal Activity/Legal Involvement Pertinent to Current Situation/Hospitalization:  No - Comment as needed Significant Relationships:  Spouse Lives with:  Spouse Do you feel safe going back to the place where you live?  No Need for family participation in patient care:  Yes (Comment)  Care giving concerns: Pt husband expressed concerns about pt having kidney stint and asked CSW what is going to be done about pt's Kidney's before she discharges to SNF. Pt and husband agreeable to SNF for higher level of care.    Social Worker assessment / plan:Clinical Education officer, museum met with pt and pt husband to discuss CSW role with discharge planning. CSW explained PT recommendation for SNF for short term rehab. Pt husband and husband agreeable to SNF. Pt husband requested SNF in [place] area so that pt is closer to home.   CSW [name] provide pt with bed offers when available.   Employment status:  Retired Forensic scientist:    PT Recommendations:  [place] / Referral to community resources:  [place]  Patient/Family's Response to care: Pt lying in bed with husband at bedside. Pt very quiet and sleepy.  Pt and husband appear happy with care pt is receiving at [facility].   Patient/Family's Understanding of and  Emotional Response to Diagnosis, Current Treatment, and Prognosis: Pt and husband appear to have a good understanding of SNF recommendation. Pt and husband also have understanding of reason for pt admission into the hospital and pt care plan.   Emotional Assessment Appearance:  Appears stated age Attitude/Demeanor/Rapport:   (Pleasant) Affect (typically observed):  Accepting, Calm, Quiet Orientation:  Oriented to Self, Oriented to Place, Oriented to  Time, Oriented to Situation Alcohol / Substance use:  Not Applicable Psych involvement (Current and /or in the community):  No (Comment)  Discharge Needs  Concerns to be addressed:  Discharge Planning Concerns Readmission within the last 30 days:    Current discharge risk:  None Barriers to Discharge:  Continued Medical Work up   [employer], LCSW 01/26/2016, 10:56 AM

## 2016-01-26 NOTE — Progress Notes (Signed)
Pt. becomes combative when approached to give meds. or treatments, and removed CPAP. When left alone pt. very quiet.

## 2016-01-26 NOTE — NC FL2 (Signed)
[place] LEVEL OF CARE SCREENING TOOL     IDENTIFICATION  Patient Name: Barbara [name] Birthdate: 08/16/1940 Sex: female Admission Date (Current Location): 01/21/2016  [facility] and [place] Number:  Herbalist and Address:  [facility] [place], [place] [address], [place], [place] [address]      Provider Number: [id number]  Attending Physician Name and Address:  Lavina Hamman, MD  Relative Name and Phone Number:       Current Level of Care: Hospital Recommended Level of Care: [place] Prior Approval Number:    Date Approved/Denied:   PASRR Number:  [id number])  Discharge Plan: SNF    Current Diagnoses: Patient Active Problem List   Diagnosis Date Noted  . C. difficile colitis 01/22/2016  . Urinary obstruction 01/21/2016  . COPD (chronic obstructive pulmonary disease) ([place]) 01/21/2016  . Controlled type 2 diabetes mellitus with stage 4 chronic kidney disease, with long-term current use of insulin ([place]) 01/21/2016  . Essential hypertension 01/21/2016  . Acute renal failure superimposed on stage 4 chronic kidney disease ([place]) 01/20/2016  . Anemia in chronic kidney disease 01/20/2016  . Sepsis ([place]) 12/18/2015  . UTI (lower urinary tract infection) 12/18/2015    Orientation RESPIRATION BLADDER Height & Weight     Self, Time, Situation, Place  Normal Incontinent Weight: 208 lb 15.9 oz (94.8 kg) Height:  5' 8"  (172.7 cm)  BEHAVIORAL SYMPTOMS/MOOD NEUROLOGICAL BOWEL NUTRITION STATUS   (None)  (None) Continent Diet (Carb modified)  AMBULATORY STATUS COMMUNICATION OF NEEDS Skin   Extensive Assist Verbally Normal                       Personal Care Assistance Level of Assistance  Bathing, Feeding, Dressing Bathing Assistance: Limited assistance Feeding assistance: Independent Dressing Assistance: Limited assistance     Functional Limitations Info  Sight, Hearing, Speech Sight Info: Adequate Hearing Info:  Adequate Speech Info: Adequate    SPECIAL CARE FACTORS FREQUENCY  PT (By licensed PT)     PT Frequency:  (5x/week)              Contractures Contractures Info: Not present    Additional Factors Info  Code Status, Allergies Code Status Info:  (full) Allergies Info:  (Percocet)           Current Medications (01/26/2016):  This is the current hospital active medication list Current Facility-Administered Medications  Medication Dose Route Frequency Provider Last Rate Last Dose  . acetaminophen (TYLENOL) tablet 650 mg  650 mg Oral Q6H PRN Edwin Dada, MD   650 mg at 01/26/16 7169   Or  . acetaminophen (TYLENOL) suppository 650 mg  650 mg Rectal Q6H PRN Edwin Dada, MD      . albuterol (PROVENTIL) (2.5 MG/3ML) 0.083% nebulizer solution 2.5 mg  2.5 mg Nebulization Q2H PRN Edwin Dada, MD      . amLODipine (NORVASC) tablet 5 mg  5 mg Oral Daily Lavina Hamman, MD   5 mg at 01/26/16 0920  . amoxicillin-clavulanate (AUGMENTIN) 500-125 MG per tablet 500 mg  1 tablet Oral Q8H Lavina Hamman, MD   500 mg at 01/26/16 0659  . aspirin EC tablet 81 mg  81 mg Oral Daily Edwin Dada, MD   81 mg at 01/26/16 0919  . atorvastatin (LIPITOR) tablet 40 mg  40 mg Oral Daily Edwin Dada, MD   40 mg at 01/26/16 0919  . famotidine (  PEPCID) tablet 20 mg  20 mg Oral Daily Lavina Hamman, MD   20 mg at 01/26/16 0920  . ferrous sulfate tablet 325 mg  325 mg Oral BID WC Lavina Hamman, MD   325 mg at 01/26/16 0918  . folic acid (FOLVITE) tablet 1 mg  1 mg Oral Daily Lavina Hamman, MD   1 mg at 01/26/16 0919  . haloperidol lactate (HALDOL) injection 2 mg  2 mg Intravenous Q6H PRN Lavina Hamman, MD      . heparin injection 5,000 Units  5,000 Units Subcutaneous Q8H Lavina Hamman, MD   5,000 Units at 01/26/16 0700  . insulin aspart (novoLOG) injection 0-15 Units  0-15 Units Subcutaneous TID WC Edwin Dada, MD   2 Units at 01/26/16 0800  . insulin  aspart (novoLOG) injection 0-5 Units  0-5 Units Subcutaneous QHS Edwin Dada, MD   0 Units at 01/21/16 2200  . labetalol (NORMODYNE,TRANDATE) injection 10 mg  10 mg Intravenous Q8H PRN Edwin Dada, MD      . megestrol (MEGACE) 400 MG/10ML suspension 400 mg  400 mg Oral Daily Lavina Hamman, MD   400 mg at 01/26/16 0919  . ondansetron (ZOFRAN) tablet 4 mg  4 mg Oral Q6H PRN Edwin Dada, MD       Or  . ondansetron (ZOFRAN) injection 4 mg  4 mg Intravenous Q6H PRN Edwin Dada, MD      . QUEtiapine (SEROQUEL) tablet 25 mg  25 mg Oral QHS Lavina Hamman, MD   25 mg at 01/25/16 2207  . vancomycin (VANCOCIN) 50 mg/mL oral solution 125 mg  125 mg Oral QID Lavina Hamman, MD   125 mg at 01/26/16 0919  . vitamin B-12 (CYANOCOBALAMIN) tablet 1,000 mcg  1,000 mcg Oral Daily Lavina Hamman, MD   1,000 mcg at 01/26/16 6153     Discharge Medications: Please see discharge summary for a list of discharge medications.  Relevant Imaging Results:  Relevant Lab Results:   Additional Information SS#:[id number]  Junie Spencer, LCSW

## 2016-01-26 NOTE — Progress Notes (Signed)
Patient refused CPAP for the night  

## 2016-01-26 NOTE — Progress Notes (Signed)
Noted irregularity in heart beat this morning.

## 2016-01-26 NOTE — Progress Notes (Signed)
Pt took all PO meds, refused the liquid megace and vancomycin.

## 2016-01-26 NOTE — Progress Notes (Signed)
[facility] Progress Note  Patient: Barbara [name] [id number]:[id number]   PCP: Madelyn Brunner, MD DOB: 1941-05-09   DOA: 01/21/2016   DOS: 01/26/2016   Date of Service: the patient was seen and examined on 01/26/2016  Subjective: As per patient's husband patient is back to her baseline. Complains about some pain in the whole foot. No nausea no vomiting. Nutrition: Tolerating oral diet with minimal oral intake  Brief hospital course: Pt. with PMH of COPD, DM 2, HTN, anemia; admitted on 01/21/2016, with complaint of fever with tachycardia, was found to have sepsis from probable pyelonephritis as well as C. difficile. Pt now has developed delirium, probably from cefepime currently resolved. 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 Culture from the nephrostomy procedure is also negative. Currently further plan is continue antibiotics.  Assessment and Plan: 1. Sepsis ([place]) from UTI as well as C. Difficile. Developed septic picture after Recent procedure with percutaneous nephrostomy tube placement. Patient is on IV Zosyn changed to Augmentin. Cefepime discontinued due to concern for CNS toxicity. Last day 01/28/2016. Blood cultures are negative from 01/20/2016. C. difficile PCR is positive, continue vancomycin.14 day treatment course last day 02/05/2016. Diarrhea is getting better. Lactic acid is stable. Renal function is also stable.  2. Acute on chronic kidney disease stage IV. Post obstructive diuresis. Discontinue ARB. At present renal function appears to be getting closer to baseline. Poor oral intake can precipitate acute kidney injury again in future.  3. Insulin-dependent diabetes mellitus. Holding long-acting insulin. While the patient remains with poor oral intake continue sliding scale insulin.  4. 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 Negative culture. Appreciate urology input, recommend internalizing the nephrostomy tube prior to discharge. Discussed with Dr. Jeffie Pollock who recommends that patient's primary urologist will be available to discuss further plan tomorrow  5. Essential hypertension. Resume amlodipine  6. COPD.  continue albuterol.  7. C. difficile by PCR. Currently on vancomycin.  8. Acute encephalopathy. Likely from cefepime causing CNS toxicity, currently resolved Possibly combination of delirium due to sepsis as well as cefepime and neurotoxicity. Discontinue cefepime transition the patient to Zosyn. Avoid benzodiazepine, we will use Haldol, continue Seroquel  9. Iron deficiency anemia. H&H remains stable and actually getting better. Low iron, low normal V-56 and folic acid level, normal reticulocyte count, normal fibrinogen, normal INR. We'll start the patient on iron supplementation E-33 as well as folic acid supplementation.  We will monitor.  Pain management: When necessary Tylenol Activity: SNF as per physical therapy Bowel regimen: last BM 01/26/2016 Diet: Cardiac carb modified diet DVT Prophylaxis: subcutaneous Heparin  Advance goals of care discussion: Full code  Family Communication: family was present at bedside, at the time of interview.   Disposition:  Discharge to likely SNF. Expected discharge date: 01/28/2016 pending urology recommendation regarding nephrostomy tube management  Consultants: Urology, interventional radiology Procedures: IR guided nephrostomy placement on the left  Antibiotics: Anti-infectives    Start     Dose/Rate Route Frequency Ordered Stop   01/26/16 1859  amoxicillin-clavulanate (AUGMENTIN) 500-125 MG per tablet 500 mg     1 tablet Oral Every 12 hours 01/26/16 1317     01/25/16 1400  amoxicillin-clavulanate (AUGMENTIN) 500-125 MG per tablet 500 mg  Status:  Discontinued     1 tablet Oral Every 8 hours 01/25/16 1031 01/26/16 1317    01/24/16 1000  piperacillin-tazobactam (ZOSYN) IVPB 3.375 g  Status:  Discontinued     3.375 g 12.5 mL/hr over 240 Minutes Intravenous Every 8 hours 01/24/16 0936 01/25/16 1030   01/22/16 1400  vancomycin (VANCOCIN) 50 mg/mL oral solution 125 mg     125 mg Oral 4 times daily 01/22/16 1239 02/05/16 1359   01/21/16 2130  ceFEPIme (MAXIPIME) 2 g in dextrose 5 % 50 mL IVPB  Status:  Discontinued     2 g 100 mL/hr over 30 Minutes Intravenous  Once 01/21/16 2115 01/21/16 2116   01/21/16 2130  ceFEPIme (MAXIPIME) 1 g in dextrose 5 % 50 mL IVPB  Status:  Discontinued     1 g 100 mL/hr over 30 Minutes Intravenous Every 24 hours 01/21/16 2117 01/24/16 0857   01/21/16 2000  cefTRIAXone (ROCEPHIN) 1 g in dextrose 5 % 50 mL IVPB  Status:  Discontinued     1 g 100 mL/hr over 30 Minutes Intravenous Every 24 hours 01/21/16 1936 01/21/16 2115   01/21/16 1945  cefTRIAXone (ROCEPHIN) 1 g in dextrose 5 % 50 mL IVPB  Status:  Discontinued    Comments:  Need dose at 2000 tonight   1 g 100 mL/hr over 30 Minutes Intravenous  Once 01/21/16 1938 01/21/16 1939        Intake/Output Summary (Last 24 hours) at 01/26/16 1841 Last data filed at 01/26/16 1410  Gross per 24 hour  Intake      0 ml  Output   2350 ml  Net  -2350 ml   Filed Weights   01/21/16 2032  Weight: 94.8 kg (208 lb 15.9 oz)    Objective: Physical Exam: Filed Vitals:   01/26/16 0647 01/26/16 0938 01/26/16 1409 01/26/16 1827  BP: 163/75 137/75 158/67 157/68  Pulse: 96 101 100 100  Temp: 98.7 F (37.1 C) 98.1 F (36.7 C) 99.5 F (37.5 C) 99.5 F (37.5 C)  TempSrc: Oral Oral Oral Oral  Resp: 18 17 18 18   Height:      Weight:      SpO2: 96% 97% 95% 96%    General: Alert, Awake and Oriented to Time, Place and Person. Appear in mild distress Eyes: PERRL, Conjunctiva normal ENT: Oral Mucosa clear moist. Neck: difficult to assess JVD, no Abnormal Mass Or lumps Cardiovascular: S1 and S2 Present, no Murmur, Respiratory: Bilateral Air  entry equal and Decreased, Clear to Auscultation, no Crackles, no wheezes Abdomen: Bowel Sound present, Soft and no tenderness Skin: no redness, no Rash  Extremities: no Pedal edema, no calf tenderness, no significant decrease in range of motion at left ankle, no swelling of the foot, no redness Neurologic: Grossly no focal neuro deficit. Bilaterally Equal motor strength  Data Reviewed: CBC:  Recent Labs Lab 01/21/16 0434 01/22/16 1242 01/23/16 0853 01/24/16 0512 01/25/16 0553  WBC 12.1* 14.6* 14.2* 12.5* 9.5  NEUTROABS  --   --  11.5* 9.5* 6.4  HGB 8.1* 7.4* 7.6* 7.5* 8.3*  HCT 24.7* 24.1* 25.5* 24.3* 27.0*  MCV 86.4 89.6 88.5 87.1 87.7  PLT 227 232 235 276 469   Basic Metabolic Panel:  Recent Labs Lab 01/22/16 0723 01/23/16 0350 01/24/16 0512 01/25/16 0553 01/26/16 0532  NA 136 140 138 138  140 139  K 4.4 4.7 4.0 3.9  4.0 3.8  CL 109 115* 111 108  108 109  CO2 18* 18* 20* 21*  23 21*  GLUCOSE 140* 138* 138* 134*  133* 133*  BUN 38* 29* 21* 22*  22* 27*  CREATININE 3.44* 2.66* 1.99* 1.92*  1.93*  1.98*  CALCIUM 8.5* 9.1 9.5 9.7  9.8 9.8  MG  --  1.4* 1.9 1.9  --   PHOS  --   --  3.0 3.7 3.7    Liver Function Tests:  Recent Labs Lab 01/20/16 1303 01/23/16 0350 01/24/16 0512 01/25/16 0553 01/26/16 0532  AST 17 17  --  36  --   ALT 13* 15  --  36  --   ALKPHOS 70 82  --  83  --   BILITOT 0.3 0.6  --  0.4  --   PROT 8.1 6.9  --  8.9*  --   ALBUMIN 3.5 2.5* 2.6* 2.6*  2.6* 2.7*    Recent Labs Lab 01/20/16 1303  LIPASE 48    Recent Labs Lab 01/25/16 0553  AMMONIA 17   Coagulation Profile:  Recent Labs Lab 01/23/16 0350 01/24/16 0512 01/25/16 0553  INR 1.38 1.38 1.23   Cardiac Enzymes: No results for input(s): CKTOTAL, CKMB, CKMBINDEX, TROPONINI in the last 168 hours. BNP (last 3 results) No results for input(s): PROBNP in the last 8760 hours.  CBG:  Recent Labs Lab 01/25/16 1702 01/25/16 2105 01/26/16 0743 01/26/16 1157  01/26/16 1659  GLUCAP 145* 144* 141* 152* 131*    Studies: No results found.   Scheduled Meds: . amLODipine  5 mg Oral Daily  . amoxicillin-clavulanate  1 tablet Oral Q12H  . aspirin EC  81 mg Oral Daily  . atorvastatin  40 mg Oral Daily  . famotidine  20 mg Oral Daily  . ferrous sulfate  325 mg Oral BID WC  . folic acid  1 mg Oral Daily  . heparin subcutaneous  5,000 Units Subcutaneous Q8H  . insulin aspart  0-15 Units Subcutaneous TID WC  . insulin aspart  0-5 Units Subcutaneous QHS  . megestrol  400 mg Oral Daily  . QUEtiapine  25 mg Oral QHS  . vancomycin  125 mg Oral QID  . vitamin B-12  1,000 mcg Oral Daily   Continuous Infusions:   PRN Meds: acetaminophen **OR** acetaminophen, albuterol, haloperidol lactate, labetalol, ondansetron **OR** ondansetron (ZOFRAN) IV  Time spent: 30 minutes  Author: Berle Mull, MD [facility] Pager: [phone] 01/26/2016 6:41 PM  If 7PM-7AM, please contact night-coverage at [email or web address], password [facility]

## 2016-01-26 NOTE — Clinical Social Work Placement (Signed)
   CLINICAL SOCIAL WORK PLACEMENT  NOTE  Date:  01/26/2016  Patient Details  Name: Grissel Tyrell MRN: [id number] Date of Birth: 05/06/1941  Clinical Social Work is seeking post-discharge placement for this patient at the [place] level of care (*CSW will initial, date and re-position this form in  chart as items are completed):  Yes   Patient/family provided with [place] Work Department's list of facilities offering this level of care within the geographic area requested by the patient (or if unable, by the patient's family).  Yes   Patient/family informed of their freedom to choose among providers that offer the needed level of care, that participate in Medicare, Medicaid or managed care program needed by the patient, have an available bed and are willing to accept the patient.  Yes   Patient/family informed of [facility]'s ownership interest in [facility] and [facility], as well as of the fact that they are under no obligation to receive care at these facilities.  PASRR submitted to EDS on       PASRR number received on       Existing PASRR number confirmed on 01/26/16     FL2 transmitted to all facilities in geographic area requested by pt/family on 01/26/16     FL2 transmitted to all facilities within larger geographic area on       Patient informed that his/her managed care company has contracts with or will negotiate with certain facilities, including the following:            Patient/family informed of bed offers received.  Patient chooses bed at       Physician recommends and patient chooses bed at      Patient to be transferred to   on  .  Patient to be transferred to facility by       Patient family notified on   of transfer.  Name of family member notified:        PHYSICIAN Please prepare priority discharge summary, including medications, Please prepare prescriptions, Please sign FL2     Additional Comment:     _______________________________________________ Junie Spencer, LCSW 01/26/2016, 11:19 AM

## 2016-01-27 LAB — RENAL FUNCTION PANEL
ALBUMIN: 2.7 g/dL — AB (ref 3.5–5.0)
ANION GAP: 12 (ref 5–15)
BUN: 33 mg/dL — AB (ref 6–20)
CALCIUM: 9.5 mg/dL (ref 8.9–10.3)
CO2: 19 mmol/L — ABNORMAL LOW (ref 22–32)
Chloride: 108 mmol/L (ref 101–111)
Creatinine, Ser: 2.25 mg/dL — ABNORMAL HIGH (ref 0.44–1.00)
GFR, EST AFRICAN AMERICAN: 23 mL/min — AB (ref >60)
GFR, EST NON AFRICAN AMERICAN: 20 mL/min — AB (ref >60)
GLUCOSE: 143 mg/dL — AB (ref 65–99)
PHOSPHORUS: 3.8 mg/dL (ref 2.5–4.6)
Potassium: 3.6 mmol/L (ref 3.5–5.1)
SODIUM: 139 mmol/L (ref 135–145)

## 2016-01-27 LAB — HEMOGLOBIN AND HEMATOCRIT, BLOOD
HCT: 27.2 % — ABNORMAL LOW (ref 36.0–46.0)
Hemoglobin: 8.3 g/dL — ABNORMAL LOW (ref 12.0–15.0)

## 2016-01-27 LAB — GLUCOSE, CAPILLARY
GLUCOSE-CAPILLARY: 121 mg/dL — AB (ref 65–99)
Glucose-Capillary: 128 mg/dL — ABNORMAL HIGH (ref 65–99)

## 2016-01-27 MED ORDER — CYANOCOBALAMIN 1000 MCG PO TABS: 1000.0000 ug | ORAL_TABLET | Freq: Every day | ORAL | Status: DC

## 2016-01-27 MED ORDER — QUETIAPINE FUMARATE 25 MG PO TABS: 25.0000 mg | ORAL_TABLET | Freq: Every day | ORAL | Status: DC

## 2016-01-27 MED ORDER — FERROUS SULFATE 325 (65 FE) MG PO TABS: 325.0000 mg | ORAL_TABLET | Freq: Two times a day (BID) | ORAL | Status: DC

## 2016-01-27 MED ORDER — VANCOMYCIN 50 MG/ML ORAL SOLUTION: 125.0000 mg | Freq: Four times a day (QID) | ORAL | Status: DC

## 2016-01-27 MED ORDER — MEGESTROL ACETATE 400 MG/10ML PO SUSP: 400.0000 mg | Freq: Every day | ORAL | Status: DC

## 2016-01-27 MED ORDER — INSULIN ASPART 100 UNIT/ML ~~LOC~~ SOLN: 0.0000 [IU] | Freq: Three times a day (TID) | SUBCUTANEOUS | Status: DC

## 2016-01-27 MED ORDER — AMOXICILLIN-POT CLAVULANATE 500-125 MG PO TABS: 1.0000 | ORAL_TABLET | Freq: Two times a day (BID) | ORAL | Status: AC

## 2016-01-27 MED ORDER — FAMOTIDINE 20 MG PO TABS: 20.0000 mg | ORAL_TABLET | Freq: Every day | ORAL | Status: DC

## 2016-01-27 MED ORDER — AMLODIPINE BESYLATE 5 MG PO TABS: 5.0000 mg | ORAL_TABLET | Freq: Every day | ORAL | Status: DC

## 2016-01-27 MED ORDER — FOLIC ACID 1 MG PO TABS: 1.0000 mg | ORAL_TABLET | Freq: Every day | ORAL | Status: DC

## 2016-01-27 NOTE — Discharge Summary (Signed)
[facility] Discharge Summary   Patient: Barbara [name] HKF:[id number]   PCP: Madelyn Brunner, MD DOB: 01/01/1941   Date of admission: 01/21/2016   Date of discharge:  01/27/2016    Discharge Diagnoses:  Principal Problem:   Sepsis ([place]) Active Problems:   UTI (lower urinary tract infection)   Acute renal failure superimposed on stage 4 chronic kidney disease (HCC)   Anemia in chronic kidney disease   Urinary obstruction   COPD (chronic obstructive pulmonary disease) ([place])   Controlled type 2 diabetes mellitus with stage 4 chronic kidney disease, with long-term current use of insulin (HCC)   Essential hypertension   C. difficile colitis   Admitted From: home to [facility] Disposition:  SNF  Recommendations for Outpatient Follow-up:  1. Please follow up with urology DR Dahlstedt in 1 week to discuss regarding nephrostomy tube 2. Follow up with PCP with BMP and CBC to monitor renal function as well anemia  Follow-up Information    Follow up with DAHLSTEDT, Lillette Boxer, MD. Schedule an appointment as soon as possible for a visit in 1 week.   Specialty:  Urology   Contact information:   [place] [address] [phone]       Follow up with Sarina Ser, Hewitt Blade, MD. Schedule an appointment as soon as possible for a visit in 1 week.   Specialty:  Internal Medicine   Why:  with BMP and CBC   Contact information:   [place] [address] [phone]      Diet recommendation: cardiac diet  Activity: The patient is advised to gradually reintroduce usual activities.  Discharge Condition: good  Code Status: full code  History of present illness: As per the H and P dictated on admission, "Barbara [name] is a 75 y.o. female with a past medical history significant for IDDM, CKD IV, COPD and HTN who presents with urinary obstruction from [facility].  The patient was in her usual state of health (lives with husband, drives,  manages finances, functional) until late May when she was admitted with UTI sepsis to [facility]. She was there for a week, treated with Vanc/Zosyn transitioned to oral Cipro for pan-sensitive E coli and discharged to SNF. She rehabbed successfully and then was home at the end of June until two days ago when she again developed malaise, weakness, chills, and this time left sided abdominal and flank pain. CT on admission yesterday at [facility] showed "Mild to moderate left hydroureteronephrosis due to a soft tissue mass at the left UVJ worrisome for carcinoma."   Today, she underwent cystoscopy with biopsy by Dr. Diona Fanti, but was unable to place a left ureteral stent. The patient decompensated with hypotension and tachycardia and fever and confusion later in the day, and so was transferred urgently to [facility] for percutaneous nephrostomy tube placement tonight by IR.  This procedure was just completed before my interview. The patient is resting in bed, mentating well, but complains of generalized malaise, left back and shoulder pain, and chills. She was given ceftriaxone at the outside hospital, and urine and blood cultures were sent (as well as an intraprocedural urine culture during PCN placement)."  Hospital Course:  Summary of her active problems in the hospital is as following. 1. Sepsis ([place]) from UTI as well as C. Difficile. Developed septic picture after Recent procedure with percutaneous nephrostomy tube placement. Patient was started on on IV cefepime and later on was on IV Zosyn  changed to Augmentin. Cefepime discontinued due to concern for CNS toxicity. Last day 01/28/2016. Blood cultures are negative from 01/20/2016. C. difficile PCR is positive, continue vancomycin.14 day treatment course last day 02/05/2016. Diarrhea is resolved. Lactic acid is stable. Renal function is also stable.  2. Acute on chronic kidney disease stage IV. Post obstructive diuresis. Discontinue ARB.  At present renal  function appears to close to baseline. Poor oral intake can precipitate acute kidney injury again in future.  3. Insulin-dependent diabetes mellitus. Pt was on linagliptin, glipizede, and lantus which has been kept on hold, this is due to hypoglycemia and AKI. Pt required minimal amount of insulin in the hospital, on sliding scale. continue sliding scale insulin.  4. 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 Negative culture. Appreciate urology input, I discussed with Dr Pilar Jarvis, who mentions that the pt will need extensive procedure with removal of kidney as well as bladder due to high grade nature of the cancer and therefor will recommend to discharge the pt on only nephrostomy tube without internalizing them.  5. Essential hypertension. Resume amlodipine  6. COPD.  continue albuterol.  7. C. difficile by PCR. Currently on vancomycin. Last day 07/19, diarrhea resolved  8. Acute encephalopathy. Resolved  Likely from cefepime causing CNS toxicity, currently resolved Possibly combination of delirium due to sepsis as well as cefepime and neurotoxicity. Discontinue cefepime transition the patient to Zosyn. Avoid benzodiazepine, we will use Haldol,  Pt was given Seroquel in the hospital at present no indication to continue on discharge as her confusion was felt to be due to medication   9. Iron deficiency anemia. H&H remains stable and actually getting better. Low iron, low normal W-10 and folic acid level, normal reticulocyte count, normal fibrinogen, normal INR. We'll start the patient on iron supplementation X-32 as well as folic acid supplementation.  We will monitor.  10. Neuropathy  Pt was on gabapentin at present holding it. If the mentation remains stable it can be restarted at much lower dose 100 mg daily.  11. Decreased  appetite Due to sepsis added megace can stop in 1 week.  All other chronic medical condition were stable during the hospitalization.  Patient was seen by physical therapy, who recommended SNF, which was arranged by Education officer, museum and case Freight forwarder. On the day of the discharge the patient's vitals were stable and diarrhea resolved, and no other acute medical condition were reported by patient. the patient was felt safe to be discharge at [facility].  Procedures and Results:  Cystoscopy, attempted left retrograde ureteropyelogram/stent placement, bladder biopsy    Placement of a left PCN. 70F pigtail placed  Consultations:  Intervention radiology  Urology  DISCHARGE MEDICATION: Current Discharge Medication List    START taking these medications   Details  amoxicillin-clavulanate (AUGMENTIN) 500-125 MG tablet Take 1 tablet (500 mg total) by mouth every 12 (twelve) hours. Qty: 3 tablet, Refills: 0    famotidine (PEPCID) 20 MG tablet Take 1 tablet (20 mg total) by mouth daily. Qty: 30 tablet, Refills: 0    ferrous sulfate 325 (65 FE) MG tablet Take 1 tablet (325 mg total) by mouth 2 (two) times daily with a meal. Qty: 60 tablet, Refills: 0    folic acid (FOLVITE) 1 MG tablet Take 1 tablet (1 mg total) by mouth daily. Qty: 30 tablet, Refills: 0    insulin aspart (NOVOLOG) 100 UNIT/ML injection Inject 0-15 Units  into the skin 3 (three) times daily with meals. Qty: 10 mL, Refills: 0    megestrol (MEGACE) 400 MG/10ML suspension Take 10 mLs (400 mg total) by mouth daily. Qty: 240 mL, Refills: 0    vancomycin (VANCOCIN) 50 mg/mL oral solution Take 2.5 mLs (125 mg total) by mouth 4 (four) times daily. Qty: 90 mL, Refills: 0    vitamin B-12 1000 MCG tablet Take 1 tablet (1,000 mcg total) by mouth daily. Qty: 30 tablet, Refills: 0      CONTINUE these medications which have CHANGED   Details  amLODipine (NORVASC) 5 MG tablet Take 1 tablet (5 mg total) by mouth daily. Qty: 30 tablet,  Refills: 0      CONTINUE these medications which have NOT CHANGED   Details  Albuterol Sulfate (PROAIR RESPICLICK) 811 (90 Base) MCG/ACT AEPB Inhale 2 puffs into the lungs 4 (four) times daily as needed (wheezing).     aspirin EC 81 MG tablet Take 81 mg by mouth daily.    atorvastatin (LIPITOR) 40 MG tablet Take 40 mg by mouth daily.    Travoprost, BAK Free, (TRAVATAN) 0.004 % SOLN ophthalmic solution Place 1 drop into both eyes at bedtime.      STOP taking these medications     esomeprazole (NEXIUM) 40 MG capsule        These are the medication that were stopped in earlier admission, related to this hospitalization from [facility].  STOP taking these medications     furosemide (LASIX) 40 MG tablet      gabapentin (NEURONTIN) 300 MG capsule      glipiZIDE (GLUCOTROL XL) 5 MG 24 hr tablet      insulin glargine (LANTUS) 100 UNIT/ML injection      lidocaine (XYLOCAINE) 5 % ointment      linagliptin (TRADJENTA) 5 MG TABS tablet      losartan-hydrochlorothiazide (HYZAAR) 100-25 MG tablet           Allergies  Allergen Reactions  . Percocet [Oxycodone-Acetaminophen] Hives   Discharge Instructions    Diet - low sodium heart healthy    Complete by:  As directed      Discharge instructions    Complete by:  As directed   It is important that you read following instructions as well as go over your medication list with RN to help you understand your care after this hospitalization.  Discharge Instructions: Please follow-up with PCP in one week, urology in 1 week  Please request your primary care physician to go over all Hospital Tests and Procedure/Radiological results at the follow up,  Please get all Hospital records sent to your PCP by signing hospital release before you go home.   Do not take more than prescribed Pain, Sleep and Anxiety Medications. You were cared for by a hospitalist during your hospital stay. If you have any questions about  your discharge medications or the care you received while you were in the hospital after you are discharged, you can call the unit and ask to speak with the hospitalist on call if the hospitalist that took care of you is not available.  Once you are discharged, your primary care physician will handle any further medical issues. Please note that NO REFILLS for any discharge medications will be authorized once you are discharged, as it is imperative that you return to your primary care physician (or establish a relationship with a primary care physician if you do not have one) for your aftercare needs so that  they can reassess your need for medications and monitor your lab values. You Must read complete instructions/literature along with all the possible adverse reactions/side effects for all the Medicines you take and that have been prescribed to you. Take any new Medicines after you have completely understood and accept all the possible adverse reactions/side effects. Wear Seat belts while driving. If you have smoked or chewed Tobacco in the last 2 yrs please stop smoking and/or stop any Recreational drug use.     Increase activity slowly    Complete by:  As directed           Discharge Exam: Filed Weights   01/21/16 2032  Weight: 94.8 kg (208 lb 15.9 oz)   Filed Vitals:   01/27/16 0210 01/27/16 0657  BP: 158/61 147/63  Pulse: 108 105  Temp: 98.6 F (37 C) 98.4 F (36.9 C)  Resp: 17 16   General: Appear in mild distress, no Rash; Oral Mucosa moist. Cardiovascular: S1 and S2 Present, aortic systolic Murmur, no JVD Respiratory: Bilateral Air entry present and Clear to Auscultation, no Crackles, no wheezes Abdomen: Bowel Sound present, Soft and no tenderness Extremities: no Pedal edema, no calf tenderness Neurology: Grossly no focal neuro deficit.  The results of significant diagnostics from this hospitalization (including imaging, microbiology, ancillary and laboratory) are listed  below for reference.    Significant Diagnostic Studies: Ct Renal Stone Study  01/20/2016  CLINICAL DATA:  Left flank pain today.  No known injury. EXAM: CT ABDOMEN AND PELVIS WITHOUT CONTRAST TECHNIQUE: Multidetector CT imaging of the abdomen and pelvis was performed following the standard protocol without IV contrast. COMPARISON:  CT abdomen and pelvis 10/03/2014. FINDINGS: There is cardiomegaly. Calcific aortic and coronary atherosclerosis is identified. No pleural or pericardial effusion. Mild dependent atelectasis is noted. There is mild to moderate left hydronephrosis on the left with dilatation of the left ureter identified. There is abnormal soft tissue attenuation at the left ureterovesical junction and along the left side of the urinary bladder worrisome for carcinoma. No urinary tract stones are identified on the right or left. Exophytic cyst off the left kidney measuring 5.7 cm in diameter is unchanged. The liver, gallbladder, adrenal glands, spleen and pancreas are unremarkable. No lymphadenopathy or fluid is seen in the abdomen or pelvis. Extensive aortoiliac atherosclerosis without aneurysm is identified. The patient is status post hysterectomy. Scattered colonic diverticula are seen but there is no evidence diverticulitis. The colon is otherwise normal appearance. The stomach, small bowel and appendix appear normal. No lytic or sclerotic bony lesion is seen. Mild appearing lower lumbar spondylosis is noted. IMPRESSION: Mild to moderate left hydroureteronephrosis due to a soft tissue mass at the left UVJ worrisome for carcinoma. Soft tissue attenuation worrisome for mass is also seen along the left side of the urinary bladder. There are no urinary tract stones. Extensive calcific aortic and coronary atherosclerosis. Diverticulosis without diverticulitis. Electronically Signed   By: Inge Rise M.D.   On: 01/20/2016 15:08   Ir Nephrostomy Placement Left  01/22/2016  INDICATION: 75 year old  female with a history of urosepsis and obstructed left ureterovesical junction secondary to tumor. Tumor was obstructing stent placement on cystoscopy. EXAM: IR NEPHROSTOMY PLACEMENT LEFT COMPARISON:  None. MEDICATIONS: Hospital administered antibiotics within 1 hour of procedure. ANESTHESIA/SEDATION: Fentanyl 25 mcg IV; Versed 0.5 mg IV Moderate Sedation Time:  15 The patient was continuously monitored during the procedure by the interventional radiology nurse under my direct supervision. CONTRAST:  10 cc - administered into the  collecting system(s) FLUOROSCOPY TIME:  Fluoroscopy Time: 2 minutes 0 seconds (11 mGy). COMPLICATIONS: None PROCEDURE: Informed written consent was obtained from the patient's family after a thorough discussion of the procedural risks, benefits and alternatives. All questions were addressed. Maximal Sterile Barrier Technique was utilized including caps, mask, sterile gowns, sterile gloves, sterile drape, hand hygiene and skin antiseptic. A timeout was performed prior to the initiation of the procedure. Patient positioned prone position on the fluoroscopy table. Ultrasound survey of the left flank was performed with images stored and sent to PACs. The patient was then prepped and draped in the usual sterile fashion. 1% lidocaine was used to anesthetize the skin and subcutaneous tissues for local anesthesia. A Chiba needle was then used to access a posterior inferior calyx with ultrasound guidance. With spontaneous urine returned through the needle, passage of an 018 micro wire into the collecting system was performed under fluoroscopy. A small incision was made with an 11 blade scalpel, and the needle was removed from the wire. An Accustick system was then advanced over the wire into the collecting system under fluoroscopy. The metal stiffener and inner dilator were removed, and then a sample of fluid was aspirated through the 4 French outer sheath. Bentson wire was passed into the  collecting system and the sheath removed. Ten French dilation of the soft tissues was performed. Using modified Seldinger technique, a 10 French pigtail catheter drain was placed over the Bentson wire. Wire and inner stiffener removed, and the pigtail was formed in the collecting system. Small amount of contrast confirmed position of the catheter. Patient tolerated the procedure well and remained hemodynamically stable throughout. No complications were encountered and no significant blood loss encountered IMPRESSION: Status post left percutaneous nephrostomy placement. Signed, Dulcy Fanny. Earleen Newport, DO [facility] Radiology Electronically Signed   By: Corrie Mckusick D.O.   On: 01/22/2016 07:50    Microbiology: Recent Results (from the past 240 hour(s))  Urine culture     Status: Abnormal   Collection Time: 01/20/16  3:00 PM  Result Value Ref Range Status   Specimen Description URINE, CLEAN CATCH  Final   Special Requests Normal  Final   Culture (A)  Final    <10,000 COLONIES/mL INSIGNIFICANT GROWTH Performed at [facility]    Report Status 01/22/2016 FINAL  Final  CULTURE, BLOOD (ROUTINE X 2) w Reflex to ID Panel     Status: None   Collection Time: 01/20/16  6:59 PM  Result Value Ref Range Status   Specimen Description BLOOD LEFT HAND  Final   Special Requests BOTTLES DRAWN AEROBIC AND ANAEROBIC 5CC  Final   Culture NO GROWTH 5 DAYS  Final   Report Status 01/25/2016 FINAL  Final  CULTURE, BLOOD (ROUTINE X 2) w Reflex to ID Panel     Status: None   Collection Time: 01/20/16  7:10 PM  Result Value Ref Range Status   Specimen Description BLOOD RIGHT HAND  Final   Special Requests BOTTLES DRAWN AEROBIC AND ANAEROBIC 5CC  Final   Culture NO GROWTH 5 DAYS  Final   Report Status 01/25/2016 FINAL  Final  Surgical pcr screen     Status: None   Collection Time: 01/21/16  6:16 AM  Result Value Ref Range Status   MRSA, PCR NEGATIVE NEGATIVE Final     Staphylococcus aureus NEGATIVE NEGATIVE Final    Comment:        The Xpert SA Assay (FDA approved for NASAL specimens  in patients over 74 years of age), is one component of a comprehensive surveillance program.  Test performance has been validated by [facility] for patients greater than or equal to 45 year old. It is not intended to diagnose infection nor to guide or monitor treatment.   Urine culture     Status: None   Collection Time: 01/21/16  7:40 PM  Result Value Ref Range Status   Specimen Description URINE, RANDOM  Final   Special Requests LEFT KIDNEY  Final   Culture NO GROWTH  Final   Report Status 01/23/2016 FINAL  Final  C difficile quick scan w PCR reflex     Status: Abnormal   Collection Time: 01/22/16 11:48 AM  Result Value Ref Range Status   C Diff antigen POSITIVE (A) NEGATIVE Final   C Diff toxin POSITIVE (A) NEGATIVE Final   C Diff interpretation Toxin producing C. difficile detected.  Final    Comment: CRITICAL RESULT CALLED TO, READ BACK BY AND VERIFIED WITH: R. BUTLER RN 12:40 01/22/16 ([email or web address])      Labs: CBC:  Recent Labs Lab 01/21/16 0434 01/22/16 1242 01/23/16 0853 01/24/16 0512 01/25/16 0553 01/27/16 0858  WBC 12.1* 14.6* 14.2* 12.5* 9.5  --   NEUTROABS  --   --  11.5* 9.5* 6.4  --   HGB 8.1* 7.4* 7.6* 7.5* 8.3* 8.3*  HCT 24.7* 24.1* 25.5* 24.3* 27.0* 27.2*  MCV 86.4 89.6 88.5 87.1 87.7  --   PLT 227 232 235 276 326  --    Basic Metabolic Panel:  Recent Labs Lab 01/23/16 0350 01/24/16 0512 01/25/16 0553 01/26/16 0532 01/27/16 0858  NA 140 138 138  140 139 139  K 4.7 4.0 3.9  4.0 3.8 3.6  CL 115* 111 108  108 109 108  CO2 18* 20* 21*  23 21* 19*  GLUCOSE 138* 138* 134*  133* 133* 143*  BUN 29* 21* 22*  22* 27* 33*  CREATININE 2.66* 1.99* 1.92*  1.93* 1.98* 2.25*  CALCIUM 9.1 9.5 9.7  9.8 9.8 9.5  MG 1.4* 1.9 1.9  --   --   PHOS  --  3.0 3.7 3.7 3.8   Liver Function Tests:  Recent Labs Lab 01/23/16 0350  01/24/16 0512 01/25/16 0553 01/26/16 0532 01/27/16 0858  AST 17  --  36  --   --   ALT 15  --  36  --   --   ALKPHOS 82  --  83  --   --   BILITOT 0.6  --  0.4  --   --   PROT 6.9  --  8.9*  --   --   ALBUMIN 2.5* 2.6* 2.6*  2.6* 2.7* 2.7*   No results for input(s): LIPASE, AMYLASE in the last 168 hours.  Recent Labs Lab 01/25/16 0553  AMMONIA 17   Cardiac Enzymes: No results for input(s): CKTOTAL, CKMB, CKMBINDEX, TROPONINI in the last 168 hours. BNP (last 3 results) No results for input(s): BNP in the last 8760 hours. CBG:  Recent Labs Lab 01/26/16 1157 01/26/16 1659 01/26/16 2201 01/27/16 0732 01/27/16 1145  GLUCAP 152* 131* 119* 121* 128*   Time spent: 30 minutes  Signed:  PATEL, PRANAV  [facility]  01/27/2016  , 1:42 PM

## 2016-01-27 NOTE — Clinical Social Work Placement (Signed)
   CLINICAL SOCIAL WORK PLACEMENT  NOTE  Date:  01/27/2016  Patient Details  Name: Barbara [name] MRN: [id number] Date of Birth: [birth year]  Clinical Social Work is seeking post-discharge placement for this patient at the [place] level of care (*CSW will initial, date and re-position this form in  chart as items are completed):  Yes   Patient/family provided with [place] Work Department's list of facilities offering this level of care within the geographic area requested by the patient (or if unable, by the patient's family).  Yes   Patient/family informed of their freedom to choose among providers that offer the needed level of care, that participate in Medicare, Medicaid or managed care program needed by the patient, have an available bed and are willing to accept the patient.  Yes   Patient/family informed of [facility]'s ownership interest in [facility] and [facility], as well as of the fact that they are under no obligation to receive care at these facilities.  PASRR submitted to EDS on       PASRR number received on       Existing PASRR number confirmed on 01/26/16     FL2 transmitted to all facilities in geographic area requested by pt/family on 01/26/16     FL2 transmitted to all facilities within larger geographic area on       Patient informed that his/her managed care company has contracts with or will negotiate with certain facilities, including the following:        Yes   Patient/family informed of bed offers received.  Patient chooses bed at [facility]     Physician recommends and patient chooses bed at      Patient to be transferred to [facility] on 01/27/16.  Patient to be transferred to facility by [facility]     Patient family notified on 01/27/16 of transfer.  Name of family member notified:  Jeneen Rinks     PHYSICIAN Please prepare priority discharge summary, including medications, Please  prepare prescriptions, Please sign FL2     Additional Comment:    _______________________________________________ Cranford Mon, LCSW 01/27/2016, 2:07 PM

## 2016-01-27 NOTE — Progress Notes (Signed)
Pt scheduled for discharge to [facility] room 607 this pm. Transportation will be provided by [facility] and pick up is booked for 1445hrs. Called receiving facility and gave report to receiving nurse Elmyra Ricks. Waiting for transport to come pick up. Foley catheter and nephrostomy drain left in place

## 2016-01-27 NOTE — Progress Notes (Signed)
Patient will discharge to [facility] Anticipated discharge date: 59/10 Family notified: pt husband and dtr at bedside Transportation by [facility]- scheduled for 2:45pm  CSW signing off.  Domenica Reamer, [place] Social Worker [phone]

## 2016-01-27 NOTE — Anesthesia Postprocedure Evaluation (Signed)
Anesthesia Post Note  Patient: Barbara [name]  Procedure(s) Performed: Procedure(s) (LRB): CYSTOSCOPY WITH double J STENT PLACEMENT (Left)  Patient location during evaluation: PACU Anesthesia Type: General Level of consciousness: awake and alert Pain management: pain level controlled Vital Signs Assessment: post-procedure vital signs reviewed and stable Respiratory status: spontaneous breathing, nonlabored ventilation, respiratory function stable and patient connected to nasal cannula oxygen Cardiovascular status: blood pressure returned to baseline and stable Postop Assessment: no signs of nausea or vomiting Anesthetic complications: no    Last Vitals:  Filed Vitals:   01/21/16 1413 01/21/16 1717  BP: 175/56 134/52  Pulse: 100 106  Temp: 37.9 C 39.3 C  Resp: 17     Last Pain:  Filed Vitals:   01/21/16 1717  PainSc: 0-No pain                 Molli Barrows

## 2016-01-31 ENCOUNTER — Encounter: Payer: Self-pay | Admitting: Urology

## 2016-02-04 ENCOUNTER — Ambulatory Visit (INDEPENDENT_AMBULATORY_CARE_PROVIDER_SITE_OTHER): Payer: Medicare Other | Admitting: Urology

## 2016-02-04 VITALS — BP 113/71 | HR 94 | Ht 69.0 in | Wt 192.0 lb

## 2016-02-04 DIAGNOSIS — Z87440 Personal history of urinary (tract) infections: Secondary | ICD-10-CM | POA: Diagnosis not present

## 2016-02-04 DIAGNOSIS — K900 Celiac disease: Secondary | ICD-10-CM | POA: Insufficient documentation

## 2016-02-04 DIAGNOSIS — N184 Chronic kidney disease, stage 4 (severe): Secondary | ICD-10-CM

## 2016-02-04 DIAGNOSIS — C670 Malignant neoplasm of trigone of bladder: Secondary | ICD-10-CM | POA: Diagnosis not present

## 2016-02-04 DIAGNOSIS — N1330 Unspecified hydronephrosis: Secondary | ICD-10-CM

## 2016-02-04 DIAGNOSIS — K2970 Gastritis, unspecified, without bleeding: Secondary | ICD-10-CM | POA: Insufficient documentation

## 2016-02-04 DIAGNOSIS — K219 Gastro-esophageal reflux disease without esophagitis: Secondary | ICD-10-CM | POA: Insufficient documentation

## 2016-02-04 DIAGNOSIS — D649 Anemia, unspecified: Secondary | ICD-10-CM | POA: Insufficient documentation

## 2016-02-04 DIAGNOSIS — J45909 Unspecified asthma, uncomplicated: Secondary | ICD-10-CM | POA: Insufficient documentation

## 2016-02-04 DIAGNOSIS — E782 Mixed hyperlipidemia: Secondary | ICD-10-CM | POA: Insufficient documentation

## 2016-02-04 NOTE — Progress Notes (Signed)
02/04/2016 4:21 PM   Barbara [name] [birth year] [id number]  Referring provider: Madelyn Brunner, MD [name] [place], [place] [address]  Chief Complaint  Patient presents with  . Routine Post Op    path results    HPI: 75 year old female who presents today for follow-up after recent admission, to have an obstructing left UVJ mass in the setting of urosepsis on 01/21/2016.  Prior to this admission, she did have a previous admission for Escherichia coli sepsis with associated left flank and lower abdominal pain.  She was taken to the operating room by Dr. Diona Fanti at which time a nodular tumor involving the left hemitrigone was appreciated. 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 Due to the distortion of the trigone, the ureteral stent was unable to be placed. She subsequently developed fevers and underwent left percutaneous nephrostomy tube after being transferred to [facility].  He returned today to discuss further workup/evaluation for her newly diagnosed bladder cancer.  She does have multiple medical comorbidities including stage IV CKD with recent acute on chronic acute kidney injury, COPD, diabetes, hypertension, OSA.    Smoked 2 ppd, quit ~97.  Foley catheter and nephrostomy tube still in place, draining well (unclear why and when the Foley catheter was placed).  She request today that the Foley catheter remain in place as she is currently not ambulatory and minimally weightbearing.  She is currently residing at [facility]. Prior to her recent critical illnesses, she was living at home with her husband, cooking and cleaning for herself. She did have some dyspnea on exertion at baseline.  PMH: Past Medical History  Diagnosis Date  . COPD (chronic obstructive pulmonary disease) ([place])   . Hypertension   . Diabetes mellitus  without complication ([place])   . Arthritis   . CKD (chronic kidney disease)   . Anemia associated with chronic renal failure   . Urinary obstruction 01/2016  . Sleep apnea     uses cpap    Surgical History: Past Surgical History  Procedure Laterality Date  . Abdominal hysterectomy    . Rotator cuff repair      left  . Kidney surgery  01/21/2016    IR NEPHROSTOMY PLACEMENT LEFT   . Cystoscopy with stent placement Left 01/21/2016    Procedure: CYSTOSCOPY WITH double J STENT PLACEMENT;  Surgeon: Franchot Gallo, MD;  Location: [facility] ORS;  Service: Urology;  Laterality: Left;    Home Medications:    Medication List       This list is accurate as of: 02/04/16  4:21 PM.  Always use your most recent med list.               amLODipine 5 MG tablet  Commonly known as:  NORVASC  Take 1 tablet (5 mg total) by mouth daily.     aspirin EC 81 MG tablet  Take 81 mg by mouth daily.     atorvastatin 40 MG tablet  Commonly known as:  LIPITOR  Take 40 mg by mouth daily.     cyanocobalamin 1000 MCG tablet  Take 1 tablet (1,000 mcg total) by mouth daily.     famotidine 20 MG tablet  Commonly known as:  PEPCID  Take 1 tablet (20 mg total) by mouth daily.     ferrous sulfate 325 (65 FE) MG tablet  Take 1 tablet (325 mg total) by mouth 2 (two)  times daily with a meal.     folic acid 1 MG tablet  Commonly known as:  FOLVITE  Take 1 tablet (1 mg total) by mouth daily.     gabapentin 300 MG capsule  Commonly known as:  NEURONTIN  Take 600 mg by mouth 3 (three) times daily.     insulin aspart 100 UNIT/ML injection  Commonly known as:  novoLOG  Inject 0-15 Units into the skin 3 (three) times daily with meals.     PROAIR RESPICLICK 509 (90 Base) MCG/ACT Aepb  Generic drug:  Albuterol Sulfate  Inhale 2 puffs into the lungs 4 (four) times daily as needed (wheezing).     Travoprost (BAK Free) 0.004 % Soln ophthalmic solution  Commonly known as:  TRAVATAN  Place 1 drop into both eyes  at bedtime.        Allergies:  Allergies  Allergen Reactions  . Percocet [Oxycodone-Acetaminophen] Hives    Family History: Family History  Problem Relation Age of Onset  . Breast cancer Neg Hx   . Diabetes Mother   . Cancer Father     Social History:  reports that she has quit smoking. She has never used smokeless tobacco. She reports that she does not drink alcohol or use illicit drugs.  ROS: UROLOGY Frequent Urination?: No Hard to postpone urination?: No Burning/pain with urination?: No Get up at night to urinate?: No Leakage of urine?: No Urine stream starts and stops?: No Trouble starting stream?: No Do you have to strain to urinate?: No Blood in urine?: No Urinary tract infection?: No Sexually transmitted disease?: No Injury to kidneys or bladder?: No Painful intercourse?: No Weak stream?: No Currently pregnant?: No Vaginal bleeding?: No Last menstrual period?: n  Gastrointestinal Nausea?: No Vomiting?: No Indigestion/heartburn?: No Diarrhea?: No Constipation?: No  Constitutional Fever: No Night sweats?: No Weight loss?: No Fatigue?: No  Skin Skin rash/lesions?: No Itching?: No  Eyes Blurred vision?: No Double vision?: No  Ears/Nose/Throat Sore throat?: No Sinus problems?: No  Hematologic/Lymphatic Swollen glands?: No Easy bruising?: No  Cardiovascular Leg swelling?: No Chest pain?: No  Respiratory Cough?: No Shortness of breath?: No  Endocrine Excessive thirst?: No  Musculoskeletal Back pain?: No Joint pain?: No  Neurological Headaches?: No Dizziness?: No  Psychologic Depression?: No Anxiety?: No  Physical Exam: BP 113/71 mmHg  Pulse 94  Ht 5' 9"  (1.753 m)  Wt 192 lb (87.091 kg)  BMI 28.34 kg/m2  Constitutional:  Alert and oriented, No acute distress.  In wheelchair accompanied by husband today. HEENT: [place] AT, moist mucus membranes.  Trachea midline, no masses. Cardiovascular: No clubbing, cyanosis, or edema.   RRR. Respiratory: Normal respiratory effort, no increased work of breathing.  CTAB. GI: Abdomen is soft, nontender, nondistended, no abdominal masses GU: No CVA tenderness. Left nephrostomy in place during clear yellow urine. Foley catheter in place draining slightly cloudy yellow urine. Skin: No rashes, bruises or suspicious lesions. Neurologic: Grossly intact, no focal deficits, moving all 4 extremities. LE weakness appreciated.   Psychiatric: Normal mood and affect.  Laboratory Data: Lab Results  Component Value Date   WBC 9.5 01/25/2016   HGB 8.3* 01/27/2016   HCT 27.2* 01/27/2016   MCV 87.7 01/25/2016   PLT 326 01/25/2016    Lab Results  Component Value Date   CREATININE 2.25* 01/27/2016     Lab Results  Component Value Date   HGBA1C 6.5* 01/20/2016    UrinalysisCatheter plug today for urine specimen collection, ultimately unable to collect sample  with time constraints from transport  Pertinent Imaging: Study Result     CLINICAL DATA: Left flank pain today. No known injury.  EXAM: CT ABDOMEN AND PELVIS WITHOUT CONTRAST  TECHNIQUE: Multidetector CT imaging of the abdomen and pelvis was performed following the standard protocol without IV contrast.  COMPARISON: CT abdomen and pelvis 10/03/2014.  FINDINGS: There is cardiomegaly. Calcific aortic and coronary atherosclerosis is identified. No pleural or pericardial effusion. Mild dependent atelectasis is noted.  There is mild to moderate left hydronephrosis on the left with dilatation of the left ureter identified. There is abnormal soft tissue attenuation at the left ureterovesical junction and along the left side of the urinary bladder worrisome for carcinoma. No urinary tract stones are identified on the right or left. Exophytic cyst off the left kidney measuring 5.7 cm in diameter is unchanged.  The liver, gallbladder, adrenal glands, spleen and pancreas are unremarkable. No lymphadenopathy or  fluid is seen in the abdomen or pelvis. Extensive aortoiliac atherosclerosis without aneurysm is identified. The patient is status post hysterectomy.  Scattered colonic diverticula are seen but there is no evidence diverticulitis. The colon is otherwise normal appearance. The stomach, small bowel and appendix appear normal.  No lytic or sclerotic bony lesion is seen. Mild appearing lower lumbar spondylosis is noted.  IMPRESSION: Mild to moderate left hydroureteronephrosis due to a soft tissue mass at the left UVJ worrisome for carcinoma. Soft tissue attenuation worrisome for mass is also seen along the left side of the urinary bladder. There are no urinary tract stones.  Extensive calcific aortic and coronary atherosclerosis.  Diverticulosis without diverticulitis.   Electronically Signed  By: Inge Rise M.D.  On: 01/20/2016 15:08    CT scan reviewed personally today with the patient  Assessment & Plan:    1. 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 Pathology was discussed today in detail. Given the appearance of the mass on CT imaging with left ureteral obstruction, highly suspicious for muscle invasive disease, possiblely T3.  I have recommended further staging, debulking with transurethral resection of the bladder tumor to prove muscle invasion. Additionally, at like to attempt to evaluate the left distal ureter to assess for possible involvement.    Also recommended further staging with a chest CT to rule out metastatic disease.  Discussed risks and benefits in detail. All of her questions were answered today. We did briefly discuss the natural history of bladder cancer and expected treatment course recommended. Unfortunately, given her multiple medical comorbidities and functional status, she is relatively poor surgical candidate for cystectomy. We will  discuss this further once clinical/ pathology staging is complete.     Schedule TURBT, retrograde pyelogram, possible right ureteroscopy/ biopsy/ fulguration, left antegrade nephrostogram, exam under anesthesia.    - CT CHEST WO CONTRAST; Future - CULTURE, URINE COMPREHENSIVE  2. Hydronephrosis, left Secondary to obstructing mass Status post nephrostomy tube placement  3. Chronic kidney disease, stage IV (severe) (HCC) Baseline severe CKD, acutely worsened due to obstructive uropathy with return to baseline ~(2-2.25) with nephrostomy tube placement    4. History of UTI Needs preop UCx    Hollice Espy, MD  [facility] [address], [place], [place] [address] [phone]  I spent 40 min with this patient of which greater than 50% was spent in counseling and coordination of care with the patient.

## 2016-02-05 ENCOUNTER — Encounter: Payer: Self-pay | Admitting: Urology

## 2016-02-05 ENCOUNTER — Telehealth: Payer: Self-pay | Admitting: Urology

## 2016-02-05 NOTE — Telephone Encounter (Signed)
Amy,   Please make sure booking sheet also includes left antegrade nephrostogram and exam under anesthesia.   Additionally, Judson Roch was unable to obtain a Ucx in clinic yesterday, can you ensure that this is done a preop in time for surgery?  Hollice Espy, MD

## 2016-02-07 ENCOUNTER — Encounter
Admission: RE | Admit: 2016-02-07 | Discharge: 2016-02-07 | Disposition: A | Discharge status: Home / Self Care | Payer: Medicare Other | Source: Ambulatory Visit | Attending: Urology | Admitting: Urology

## 2016-02-07 DIAGNOSIS — Z01812 Encounter for preprocedural laboratory examination: Secondary | ICD-10-CM | POA: Diagnosis not present

## 2016-02-07 LAB — URINALYSIS COMPLETE WITH MICROSCOPIC (ARMC ONLY)
Bacteria, UA: NONE SEEN
Bilirubin Urine: NEGATIVE
GLUCOSE, UA: NEGATIVE mg/dL
Ketones, ur: NEGATIVE mg/dL
Nitrite: NEGATIVE
PROTEIN: 100 mg/dL — AB
Specific Gravity, Urine: 1.012 (ref 1.005–1.030)
pH: 5 (ref 5.0–8.0)

## 2016-02-07 LAB — CBC
HCT: 27.6 % — ABNORMAL LOW (ref 35.0–47.0)
Hemoglobin: 8.8 g/dL — ABNORMAL LOW (ref 12.0–16.0)
MCH: 27.1 pg (ref 26.0–34.0)
MCHC: 31.9 g/dL — AB (ref 32.0–36.0)
MCV: 84.8 fL (ref 80.0–100.0)
PLATELETS: 468 10*3/uL — AB (ref 150–440)
RBC: 3.25 MIL/uL — ABNORMAL LOW (ref 3.80–5.20)
RDW: 15.9 % — AB (ref 11.5–14.5)
WBC: 14.4 10*3/uL — ABNORMAL HIGH (ref 3.6–11.0)

## 2016-02-07 LAB — BASIC METABOLIC PANEL
Anion gap: 11 (ref 5–15)
BUN: 44 mg/dL — AB (ref 6–20)
CO2: 20 mmol/L — ABNORMAL LOW (ref 22–32)
CREATININE: 1.96 mg/dL — AB (ref 0.44–1.00)
Calcium: 10.1 mg/dL (ref 8.9–10.3)
Chloride: 107 mmol/L (ref 101–111)
GFR calc Af Amer: 27 mL/min — ABNORMAL LOW (ref >60)
GFR, EST NON AFRICAN AMERICAN: 24 mL/min — AB (ref >60)
Glucose, Bld: 96 mg/dL (ref 65–99)
Potassium: 4.8 mmol/L (ref 3.5–5.1)
SODIUM: 138 mmol/L (ref 135–145)

## 2016-02-07 NOTE — Pre-Procedure Instructions (Signed)
ANESTHESIA- COPIED FROM ED NOTE ON 01-20-2016 INTERPRETED BY ED MD.  EKG: Interpreted by me. Sinus rhythm with a rate of 90 bpm, normal PR interval, normal QRS, normal QT interval. Normal axis

## 2016-02-07 NOTE — Patient Instructions (Signed)
Your procedure is scheduled on: February 17, 2016 Su procedimiento est programado para: Report toSAME [place] a: To find out your arrival time please call [phone] between 1PM - 3PM on February 16, 2016 Para saber su hora de llegada por favor llame al ([place]:  Remember: Instructions that are not followed completely may result in serious medical risk, up to and including death, or upon the discretion of your surgeon and anesthesiologist your surgery may need to be rescheduled.  Recuerde: Las instrucciones que no se siguen completamente Heritage manager en un riesgo de salud grave, incluyendo hasta la [place] o a discrecin de su cirujano y Environmental health practitioner, su ciruga se puede posponer.   ___X_ 1. Do not eat food or drink liquids after midnight. No gum chewing or hard candies.  No coma alimentos ni tome lquidos despus de la medianoche.  No mastique chicle ni caramelos  duros.     __X__ 2. No alcohol for 24 hours before or after surgery.    No tome alcohol durante las 24 horas antes ni despus de la [place].   ____ 3. Bring all medications with you on the day of surgery if instructed.    Lleve todos los medicamentos con usted el da de su ciruga si se le ha indicado as.   __X__ 4. Notify your doctor if there is any change in your medical condition (cold, fever,                             infections).    Informe a su mdico si hay algn cambio en su condicin mdica (resfriado, fiebre, infecciones).   Do not wear jewelry, make-up, hairpins, clips or nail polish.  No use joyas, maquillajes, pinzas/ganchos para el cabello ni esmalte de uas.  Do not wear lotions, powders, or perfumes. You may wear deodorant.  No use lociones, polvos o perfumes.  Puede usar desodorante.    Do not shave 48 hours prior to surgery. Men may shave face and neck.  No se afeite 48 horas antes de la [place].  Los hombres pueden [employer]  cara y el cuello.   Do not bring valuables to the hospital.   No lleve objetos [facility] is not responsible for any belongings or valuables.  [place] no se hace responsable de ningn tipo de pertenencias u objetos de Geographical information systems officer.               Contacts, dentures or bridgework may not be worn into surgery.  Los lentes de [place], las dentaduras postizas o puentes no se pueden usar en la [place].  Leave your suitcase in the car. After surgery it may be brought to your room.  Deje su maleta en el auto.  Despus de la ciruga podr traerla a su habitacin.  For patients admitted to the hospital, discharge time is determined by your treatment team.  Para los pacientes que sean ingresados al hospital, el tiempo en el cual se le dar de alta es determinado por su                equipo de [place].   Patients discharged the day of surgery will not be allowed to drive home. A los pacientes que se les da de alta el mismo da de la ciruga no se les permitir conducir a Holiday representative.  Please read over the following fact sheets that you were given: Por favor [place] informacin que le dieron:    SURGICAL INFECTION INFORMATION  ____ Take these medicines the morning of surgery with A SIP OF WATER:          [employer] estas medicinas la maana de la ciruga con UN SORBO DE AGUA:  1.AMLODIPINE  2. LIPITOR  3. FAMOTIDINE  4.  GABAPENTIN     5.  6.  ____ Fleet Enema (as directed)          Enema de [place] (segn lo indicado)    ____ Use CHG Soap as directed          Utilice el jabn de CHG segn lo indicado  _X___ Use inhalers on the day of surgery          Use los inhaladores el da de la ciruga  ____ Stop metformin 2 days prior to surgery          Deje de tomar el metformin 2 das antes de la ciruga    __X_ Take 1/2 of usual insulin dose the night before surgery and none on the morning of surgery           Tome la mitad de la dosis habitual de insulina la  noche antes de la [place] y no tome nada en la maana de la             ciruga  ___X_ Stop Coumadin/Plavix/aspirin on February 10, 2016          Deje de tomar el Coumadin/Plavix/aspirina el da:  __X__ Stop Anti-inflammatories on February 10, 2016 IBUPROFEN, ALEVE, MOTRIN, ADVIL--- ONLY TYLENOL FOR PAIN          Deje de tomar antiinflamatorios el da:   ___X_ Stop supplements until after surgery FOLIC ACID ON February 10, 2016          Deje de tomar suplementos hasta despus de la ciruga  ___x_ Bring C-Pap to the hospital          [place] al hospital

## 2016-02-07 NOTE — Pre-Procedure Instructions (Signed)
Reviewed CBC results with Dr [place] Service - no new orders.

## 2016-02-08 LAB — URINE CULTURE: Culture: 80000 — AB

## 2016-02-10 ENCOUNTER — Encounter: Payer: Self-pay | Admitting: *Deleted

## 2016-02-10 ENCOUNTER — Telehealth: Payer: Self-pay

## 2016-02-10 DIAGNOSIS — B379 Candidiasis, unspecified: Secondary | ICD-10-CM

## 2016-02-10 MED ORDER — FLUCONAZOLE 100 MG PO TABS: ORAL_TABLET | ORAL | 0 refills | Status: DC

## 2016-02-10 NOTE — Telephone Encounter (Signed)
-----   Message from Hollice Espy, MD sent at 02/09/2016  2:00 PM EDT ----- Please treat with fluconazole 100 mg daily x 7 days starting 5 days prior to surgery.  Hollice Espy, MD

## 2016-02-10 NOTE — Telephone Encounter (Signed)
No answer

## 2016-02-11 MED ORDER — FLUCONAZOLE 100 MG PO TABS: ORAL_TABLET | ORAL | 0 refills | Status: DC

## 2016-02-11 NOTE — Telephone Encounter (Signed)
Pt nurse is going to return the call.

## 2016-02-11 NOTE — Telephone Encounter (Signed)
Orders and rx have been faxed to [facility].

## 2016-02-13 NOTE — Telephone Encounter (Signed)
Done

## 2016-02-14 ENCOUNTER — Telehealth: Payer: Self-pay | Admitting: Urology

## 2016-02-17 ENCOUNTER — Ambulatory Visit: Payer: Medicare Other | Admitting: Anesthesiology

## 2016-02-17 ENCOUNTER — Encounter: Payer: Self-pay | Admitting: *Deleted

## 2016-02-17 ENCOUNTER — Encounter
Admission: RE | Disposition: A | Discharge status: Skilled Nursing Facility | Payer: Self-pay | Source: Ambulatory Visit | Attending: Urology

## 2016-02-17 ENCOUNTER — Ambulatory Visit
Admission: RE | Admit: 2016-02-17 | Discharge: 2016-02-17 | Disposition: A | Discharge status: Skilled Nursing Facility | Payer: Medicare Other | Source: Ambulatory Visit | Attending: Urology | Admitting: Urology

## 2016-02-17 DIAGNOSIS — N184 Chronic kidney disease, stage 4 (severe): Secondary | ICD-10-CM | POA: Diagnosis not present

## 2016-02-17 DIAGNOSIS — N1330 Unspecified hydronephrosis: Secondary | ICD-10-CM | POA: Diagnosis not present

## 2016-02-17 DIAGNOSIS — K5790 Diverticulosis of intestine, part unspecified, without perforation or abscess without bleeding: Secondary | ICD-10-CM | POA: Insufficient documentation

## 2016-02-17 DIAGNOSIS — C670 Malignant neoplasm of trigone of bladder: Secondary | ICD-10-CM | POA: Insufficient documentation

## 2016-02-17 DIAGNOSIS — Z7982 Long term (current) use of aspirin: Secondary | ICD-10-CM | POA: Insufficient documentation

## 2016-02-17 DIAGNOSIS — J449 Chronic obstructive pulmonary disease, unspecified: Secondary | ICD-10-CM | POA: Diagnosis not present

## 2016-02-17 DIAGNOSIS — Z794 Long term (current) use of insulin: Secondary | ICD-10-CM | POA: Diagnosis not present

## 2016-02-17 DIAGNOSIS — Z87891 Personal history of nicotine dependence: Secondary | ICD-10-CM | POA: Diagnosis not present

## 2016-02-17 DIAGNOSIS — Z7189 Other specified counseling: Secondary | ICD-10-CM

## 2016-02-17 DIAGNOSIS — I2510 Atherosclerotic heart disease of native coronary artery without angina pectoris: Secondary | ICD-10-CM | POA: Diagnosis not present

## 2016-02-17 DIAGNOSIS — G4733 Obstructive sleep apnea (adult) (pediatric): Secondary | ICD-10-CM | POA: Diagnosis not present

## 2016-02-17 DIAGNOSIS — Z87440 Personal history of urinary (tract) infections: Secondary | ICD-10-CM | POA: Diagnosis not present

## 2016-02-17 DIAGNOSIS — N132 Hydronephrosis with renal and ureteral calculous obstruction: Secondary | ICD-10-CM | POA: Insufficient documentation

## 2016-02-17 DIAGNOSIS — Z833 Family history of diabetes mellitus: Secondary | ICD-10-CM | POA: Insufficient documentation

## 2016-02-17 DIAGNOSIS — Z936 Other artificial openings of urinary tract status: Secondary | ICD-10-CM | POA: Insufficient documentation

## 2016-02-17 DIAGNOSIS — N179 Acute kidney failure, unspecified: Secondary | ICD-10-CM | POA: Diagnosis not present

## 2016-02-17 DIAGNOSIS — Z885 Allergy status to narcotic agent status: Secondary | ICD-10-CM | POA: Insufficient documentation

## 2016-02-17 DIAGNOSIS — M1990 Unspecified osteoarthritis, unspecified site: Secondary | ICD-10-CM | POA: Insufficient documentation

## 2016-02-17 DIAGNOSIS — E1122 Type 2 diabetes mellitus with diabetic chronic kidney disease: Secondary | ICD-10-CM | POA: Insufficient documentation

## 2016-02-17 DIAGNOSIS — Z9889 Other specified postprocedural states: Secondary | ICD-10-CM | POA: Insufficient documentation

## 2016-02-17 DIAGNOSIS — Z515 Encounter for palliative care: Secondary | ICD-10-CM

## 2016-02-17 DIAGNOSIS — D631 Anemia in chronic kidney disease: Secondary | ICD-10-CM | POA: Insufficient documentation

## 2016-02-17 DIAGNOSIS — I129 Hypertensive chronic kidney disease with stage 1 through stage 4 chronic kidney disease, or unspecified chronic kidney disease: Secondary | ICD-10-CM | POA: Insufficient documentation

## 2016-02-17 HISTORY — PX: URETEROSCOPY: SHX842

## 2016-02-17 HISTORY — PX: TRANSURETHRAL RESECTION OF BLADDER TUMOR: SHX2575

## 2016-02-17 HISTORY — PX: CYSTOSCOPY W/ RETROGRADES: SHX1426

## 2016-02-17 LAB — GLUCOSE, CAPILLARY: Glucose-Capillary: 94 mg/dL (ref 65–99)

## 2016-02-17 SURGERY — TURBT (TRANSURETHRAL RESECTION OF BLADDER TUMOR)
Anesthesia: General | Wound class: Clean Contaminated

## 2016-02-17 MED ORDER — GENTAMICIN SULFATE 40 MG/ML IJ SOLN
80.0000 mg | Freq: Once | INTRAVENOUS | Status: DC
  Filled 2016-02-17: qty 2

## 2016-02-17 MED ORDER — SODIUM CHLORIDE 0.9 % IV SOLN
INTRAVENOUS | Status: DC
  Administered 2016-02-17: 75 mL/h via INTRAVENOUS

## 2016-02-17 MED ORDER — PHENYLEPHRINE HCL 10 MG/ML IJ SOLN
INTRAVENOUS | Status: DC | PRN
  Administered 2016-02-17: 25 ug/min via INTRAVENOUS

## 2016-02-17 MED ORDER — ONDANSETRON HCL 4 MG/2ML IJ SOLN
INTRAMUSCULAR | Status: DC | PRN
  Administered 2016-02-17: 4 mg via INTRAVENOUS

## 2016-02-17 MED ORDER — GENTAMICIN IN SALINE 1.6-0.9 MG/ML-% IV SOLN
80.0000 mg | INTRAVENOUS | Status: AC
  Administered 2016-02-17: 80 mg via INTRAVENOUS
  Filled 2016-02-17: qty 50

## 2016-02-17 MED ORDER — BELLADONNA ALKALOIDS-OPIUM 16.2-60 MG RE SUPP
RECTAL | Status: AC
  Filled 2016-02-17: qty 1

## 2016-02-17 MED ORDER — PROPOFOL 10 MG/ML IV BOLUS
INTRAVENOUS | Status: DC | PRN
  Administered 2016-02-17: 130 mg via INTRAVENOUS

## 2016-02-17 MED ORDER — GLYCOPYRROLATE 0.2 MG/ML IJ SOLN
INTRAMUSCULAR | Status: DC | PRN
  Administered 2016-02-17: 0.4 mg via INTRAVENOUS

## 2016-02-17 MED ORDER — FENTANYL CITRATE (PF) 100 MCG/2ML IJ SOLN
INTRAMUSCULAR | Status: DC | PRN
  Administered 2016-02-17: 200 ug via INTRAVENOUS
  Administered 2016-02-17: 50 ug via INTRAVENOUS
  Administered 2016-02-17: 250 ug via INTRAVENOUS

## 2016-02-17 MED ORDER — ONDANSETRON HCL 4 MG/2ML IJ SOLN
INTRAMUSCULAR | Status: AC
  Filled 2016-02-17: qty 2

## 2016-02-17 MED ORDER — FENTANYL CITRATE (PF) 100 MCG/2ML IJ SOLN: 25.0000 ug | INTRAMUSCULAR | Status: DC | PRN

## 2016-02-17 MED ORDER — SUCCINYLCHOLINE CHLORIDE 20 MG/ML IJ SOLN
INTRAMUSCULAR | Status: DC | PRN
  Administered 2016-02-17: 100 mg via INTRAVENOUS

## 2016-02-17 MED ORDER — ROCURONIUM BROMIDE 100 MG/10ML IV SOLN
INTRAVENOUS | Status: DC | PRN
  Administered 2016-02-17: 50 mg via INTRAVENOUS

## 2016-02-17 MED ORDER — HYDROCODONE-ACETAMINOPHEN 5-325 MG PO TABS: 1.0000 | ORAL_TABLET | Freq: Four times a day (QID) | ORAL | 0 refills | Status: DC | PRN

## 2016-02-17 MED ORDER — IOTHALAMATE MEGLUMINE 43 % IV SOLN
INTRAVENOUS | Status: DC | PRN
  Administered 2016-02-17: 20 mL

## 2016-02-17 MED ORDER — NEOSTIGMINE METHYLSULFATE 10 MG/10ML IV SOLN
INTRAVENOUS | Status: DC | PRN
  Administered 2016-02-17: 3 mg via INTRAVENOUS

## 2016-02-17 MED ORDER — PHENYLEPHRINE HCL 10 MG/ML IJ SOLN
INTRAMUSCULAR | Status: DC | PRN
  Administered 2016-02-17 (×4): 100 ug via INTRAVENOUS

## 2016-02-17 MED ORDER — ONDANSETRON HCL 4 MG/2ML IJ SOLN
4.0000 mg | Freq: Once | INTRAMUSCULAR | Status: AC | PRN
  Administered 2016-02-17: 4 mg via INTRAVENOUS

## 2016-02-17 MED ORDER — LIDOCAINE HCL (CARDIAC) 20 MG/ML IV SOLN
INTRAVENOUS | Status: DC | PRN
  Administered 2016-02-17: 100 mg via INTRAVENOUS

## 2016-02-17 MED ORDER — AMPICILLIN SODIUM 1 G IJ SOLR
1.0000 g | Freq: Once | INTRAMUSCULAR | Status: AC
  Administered 2016-02-17: 1 g via INTRAVENOUS

## 2016-02-17 SURGICAL SUPPLY — 39 items
ADAPTER SCOPE UROLOK II (MISCELLANEOUS) IMPLANT
BAG DRAIN CYSTO-URO LG1000N (MISCELLANEOUS) ×5 IMPLANT
BAG URO DRAIN 2000ML W/SPOUT (MISCELLANEOUS) ×5 IMPLANT
CATH FOLEY 2WAY  5CC 16FR (CATHETERS) ×2
CATH URETL 5X70 OPEN END (CATHETERS) ×5 IMPLANT
CATH URTH 16FR FL 2W BLN LF (CATHETERS) ×3 IMPLANT
CNTNR SPEC 2.5X3XGRAD LEK (MISCELLANEOUS) ×3
CONRAY 43 FOR UROLOGY 50M (MISCELLANEOUS) ×5 IMPLANT
CONT SPEC 4OZ STER OR WHT (MISCELLANEOUS) ×2
CONTAINER SPEC 2.5X3XGRAD LEK (MISCELLANEOUS) ×3 IMPLANT
CORD URO TURP 10FT (MISCELLANEOUS) ×5 IMPLANT
DRAPE UTILITY 15X26 TOWEL STRL (DRAPES) ×5 IMPLANT
ELECT LOOP 22F BIPOLAR SML (ELECTROSURGICAL) ×5
ELECT REM PT RETURN 9FT ADLT (ELECTROSURGICAL)
ELECTRODE LOOP 22F BIPOLAR SML (ELECTROSURGICAL) ×3 IMPLANT
ELECTRODE REM PT RTRN 9FT ADLT (ELECTROSURGICAL) IMPLANT
GLOVE BIO SURGEON STRL SZ 6.5 (GLOVE) ×12 IMPLANT
GLOVE BIO SURGEON STRL SZ7 (GLOVE) ×10 IMPLANT
GLOVE BIO SURGEONS STRL SZ 6.5 (GLOVE) ×3
GOWN STRL REUS W/ TWL LRG LVL3 (GOWN DISPOSABLE) ×9 IMPLANT
GOWN STRL REUS W/TWL LRG LVL3 (GOWN DISPOSABLE) ×6
INTRODUCER DILATOR DOUBLE (INTRODUCER) IMPLANT
KIT RM TURNOVER CYSTO AR (KITS) ×5 IMPLANT
LOOP CUT BIPOLAR 24F LRG (ELECTROSURGICAL) IMPLANT
PACK CYSTO AR (MISCELLANEOUS) ×5 IMPLANT
PLUG CATH AND CAP STER (CATHETERS) IMPLANT
PREP PVP WINGED SPONGE (MISCELLANEOUS) IMPLANT
PUMP SINGLE ACTION SAP (PUMP) IMPLANT
SENSORWIRE 0.038 NOT ANGLED (WIRE) ×5
SET CYSTO W/LG BORE CLAMP LF (SET/KITS/TRAYS/PACK) ×5 IMPLANT
SET IRRIG Y TYPE TUR BLADDER L (SET/KITS/TRAYS/PACK) ×5 IMPLANT
SET IRRIGATING DISP (SET/KITS/TRAYS/PACK) ×5 IMPLANT
SHEATH URETERAL 12FRX35CM (MISCELLANEOUS) IMPLANT
SOL .9 NS 3000ML IRR  AL (IV SOLUTION) ×2
SOL .9 NS 3000ML IRR UROMATIC (IV SOLUTION) ×3 IMPLANT
STENT URO INLAY 6FRX24CM (STENTS) ×5 IMPLANT
SURGILUBE 2OZ TUBE FLIPTOP (MISCELLANEOUS) ×5 IMPLANT
WATER STERILE IRR 1000ML POUR (IV SOLUTION) ×5 IMPLANT
WIRE SENSOR 0.038 NOT ANGLED (WIRE) ×3 IMPLANT

## 2016-02-17 NOTE — Progress Notes (Signed)
Dr. Erlene Quan notified re diflucan

## 2016-02-17 NOTE — Interval H&P Note (Signed)
History and Physical Interval Note:  02/17/2016 11:22 AM  Barbara [name]  has presented today for surgery, with the diagnosis of BLADDER CANCER,LEFT HYDRONEPHROSIS  The various methods of treatment have been discussed with the patient and family. After consideration of risks, benefits and other options for treatment, the patient has consented to  Procedure(s): TRANSURETHRAL RESECTION OF BLADDER TUMOR (TURBT)-LARGE (N/A) URETEROSCOPY (Left) CYSTOSCOPY WITH RETROGRADE PYELOGRAM (Bilateral) as a surgical intervention .  The patient's history has been reviewed, patient examined, no change in status, stable for surgery.  I have reviewed the patient's chart and labs.  Questions were answered to the patient's satisfaction.     Hollice Espy

## 2016-02-17 NOTE — Anesthesia Postprocedure Evaluation (Signed)
Anesthesia Post Note  Patient: Barbara [name]  Procedure(s) Performed: Procedure(s) (LRB): TRANSURETHRAL RESECTION OF BLADDER TUMOR (TURBT)-LARGE (N/A) URETEROSCOPY (Left) CYSTOSCOPY WITH RETROGRADE PYELOGRAM (Bilateral)  Patient location during evaluation: PACU Anesthesia Type: General Level of consciousness: awake Pain management: satisfactory to patient Vital Signs Assessment: post-procedure vital signs reviewed and stable Respiratory status: spontaneous breathing Cardiovascular status: stable Anesthetic complications: no    Last Vitals:  Vitals:   02/17/16 0929 02/17/16 1344  BP: 135/78 (!) 144/65  Pulse: (!) 57 100  Resp: 18 18  Temp: 36.8 C 36.8 C    Last Pain:  Vitals:   02/17/16 1344  TempSrc:   PainSc: 0-No pain                 VAN STAVEREN,Idamae Coccia

## 2016-02-17 NOTE — OR Nursing (Signed)
Per [facility], [phone] pt was diflucan this am  (started on 02/12/16)

## 2016-02-17 NOTE — H&P (View-Only) (Signed)
02/04/2016 4:21 PM   Barbara [name] Jun 02, 1941 [id number]  Referring provider: Madelyn Brunner, MD [facility] [place], [place] [address]  Chief Complaint  Patient presents with  . Routine Post Op    path results    HPI: 75 year old female who presents today for follow-up after recent admission, to have an obstructing left UVJ mass in the setting of urosepsis on 01/21/2016.  Prior to this admission, she did have a previous admission for Escherichia coli sepsis with associated left flank and lower abdominal pain.  She was taken to the operating room by Dr. Diona Fanti at which time a nodular tumor involving the left hemitrigone was appreciated. 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 Due to the distortion of the trigone, the ureteral stent was unable to be placed. She subsequently developed fevers and underwent left percutaneous nephrostomy tube after being transferred to [facility].  He returned today to discuss further workup/evaluation for her newly diagnosed bladder cancer.  She does have multiple medical comorbidities including stage IV CKD with recent acute on chronic acute kidney injury, COPD, diabetes, hypertension, OSA.    Smoked 2 ppd, quit ~97.  Foley catheter and nephrostomy tube still in place, draining well (unclear why and when the Foley catheter was placed).  She request today that the Foley catheter remain in place as she is currently not ambulatory and minimally weightbearing.  [facility] is [facility]. Prior to her recent critical illnesses, she was living at home with her husband, cooking and cleaning for herself. She did have some dyspnea on exertion at baseline.  PMH: Past Medical History  Diagnosis Date  . COPD (chronic obstructive pulmonary disease) ([place])   . Hypertension   . Diabetes mellitus  without complication ([place])   . Arthritis   . CKD (chronic kidney disease)   . Anemia associated with chronic renal failure   . Urinary obstruction 01/2016  . Sleep apnea     uses cpap    Surgical History: Past Surgical History  Procedure Laterality Date  . Abdominal hysterectomy    . Rotator cuff repair      left  . Kidney surgery  01/21/2016    IR NEPHROSTOMY PLACEMENT LEFT   . Cystoscopy with stent placement Left 01/21/2016    Procedure: CYSTOSCOPY WITH double J STENT PLACEMENT;  Surgeon: Franchot Gallo, MD;  Location: [facility] ORS;  Service: Urology;  Laterality: Left;    Home Medications:    Medication List       This list is accurate as of: 02/04/16  4:21 PM.  Always use your most recent med list.               amLODipine 5 MG tablet  Commonly known as:  NORVASC  Take 1 tablet (5 mg total) by mouth daily.     aspirin EC 81 MG tablet  Take 81 mg by mouth daily.     atorvastatin 40 MG tablet  Commonly known as:  LIPITOR  Take 40 mg by mouth daily.     cyanocobalamin 1000 MCG tablet  Take 1 tablet (1,000 mcg total) by mouth daily.     famotidine 20 MG tablet  Commonly known as:  PEPCID  Take 1 tablet (20 mg total) by mouth daily.     ferrous sulfate 325 (65 FE) MG tablet  Take 1 tablet (325 mg total) by mouth 2 (two)  times daily with a meal.     folic acid 1 MG tablet  Commonly known as:  FOLVITE  Take 1 tablet (1 mg total) by mouth daily.     gabapentin 300 MG capsule  Commonly known as:  NEURONTIN  Take 600 mg by mouth 3 (three) times daily.     insulin aspart 100 UNIT/ML injection  Commonly known as:  novoLOG  Inject 0-15 Units into the skin 3 (three) times daily with meals.     PROAIR RESPICLICK 161 (90 Base) MCG/ACT Aepb  Generic drug:  Albuterol Sulfate  Inhale 2 puffs into the lungs 4 (four) times daily as needed (wheezing).     Travoprost (BAK Free) 0.004 % Soln ophthalmic solution  Commonly known as:  TRAVATAN  Place 1 drop into both eyes  at bedtime.        Allergies:  Allergies  Allergen Reactions  . Percocet [Oxycodone-Acetaminophen] Hives    Family History: Family History  Problem Relation Age of Onset  . Breast cancer Neg Hx   . Diabetes Mother   . Cancer Father     Social History:  reports that she has quit smoking. She has never used smokeless tobacco. She reports that she does not drink alcohol or use illicit drugs.  ROS: UROLOGY Frequent Urination?: No Hard to postpone urination?: No Burning/pain with urination?: No Get up at night to urinate?: No Leakage of urine?: No Urine stream starts and stops?: No Trouble starting stream?: No Do you have to strain to urinate?: No Blood in urine?: No Urinary tract infection?: No Sexually transmitted disease?: No Injury to kidneys or bladder?: No Painful intercourse?: No Weak stream?: No Currently pregnant?: No Vaginal bleeding?: No Last menstrual period?: n  Gastrointestinal Nausea?: No Vomiting?: No Indigestion/heartburn?: No Diarrhea?: No Constipation?: No  Constitutional Fever: No Night sweats?: No Weight loss?: No Fatigue?: No  Skin Skin rash/lesions?: No Itching?: No  Eyes Blurred vision?: No Double vision?: No  Ears/Nose/Throat Sore throat?: No Sinus problems?: No  Hematologic/Lymphatic Swollen glands?: No Easy bruising?: No  Cardiovascular Leg swelling?: No Chest pain?: No  Respiratory Cough?: No Shortness of breath?: No  Endocrine Excessive thirst?: No  Musculoskeletal Back pain?: No Joint pain?: No  Neurological Headaches?: No Dizziness?: No  Psychologic Depression?: No Anxiety?: No  Physical Exam: BP 113/71 mmHg  Pulse 94  Ht 5' 9"  (1.753 m)  Wt 192 lb (87.091 kg)  BMI 28.34 kg/m2  Constitutional:  Alert and oriented, No acute distress.  In wheelchair accompanied by husband today. HEENT: [place] AT, moist mucus membranes.  Trachea midline, no masses. Cardiovascular: No clubbing, cyanosis, or edema.   RRR. Respiratory: Normal respiratory effort, no increased work of breathing.  CTAB. GI: Abdomen is soft, nontender, nondistended, no abdominal masses GU: No CVA tenderness. Left nephrostomy in place during clear yellow urine. Foley catheter in place draining slightly cloudy yellow urine. Skin: No rashes, bruises or suspicious lesions. Neurologic: Grossly intact, no focal deficits, moving all 4 extremities. LE weakness appreciated.   Psychiatric: Normal mood and affect.  Laboratory Data: Lab Results  Component Value Date   WBC 9.5 01/25/2016   HGB 8.3* 01/27/2016   HCT 27.2* 01/27/2016   MCV 87.7 01/25/2016   PLT 326 01/25/2016    Lab Results  Component Value Date   CREATININE 2.25* 01/27/2016     Lab Results  Component Value Date   HGBA1C 6.5* 01/20/2016    UrinalysisCatheter plug today for urine specimen collection, ultimately unable to collect sample  with time constraints from transport  Pertinent Imaging: Study Result     CLINICAL DATA: Left flank pain today. No known injury.  EXAM: CT ABDOMEN AND PELVIS WITHOUT CONTRAST  TECHNIQUE: Multidetector CT imaging of the abdomen and pelvis was performed following the standard protocol without IV contrast.  COMPARISON: CT abdomen and pelvis 10/03/2014.  FINDINGS: There is cardiomegaly. Calcific aortic and coronary atherosclerosis is identified. No pleural or pericardial effusion. Mild dependent atelectasis is noted.  There is mild to moderate left hydronephrosis on the left with dilatation of the left ureter identified. There is abnormal soft tissue attenuation at the left ureterovesical junction and along the left side of the urinary bladder worrisome for carcinoma. No urinary tract stones are identified on the right or left. Exophytic cyst off the left kidney measuring 5.7 cm in diameter is unchanged.  The liver, gallbladder, adrenal glands, spleen and pancreas are unremarkable. No lymphadenopathy or  fluid is seen in the abdomen or pelvis. Extensive aortoiliac atherosclerosis without aneurysm is identified. The patient is status post hysterectomy.  Scattered colonic diverticula are seen but there is no evidence diverticulitis. The colon is otherwise normal appearance. The stomach, small bowel and appendix appear normal.  No lytic or sclerotic bony lesion is seen. Mild appearing lower lumbar spondylosis is noted.  IMPRESSION: Mild to moderate left hydroureteronephrosis due to a soft tissue mass at the left UVJ worrisome for carcinoma. Soft tissue attenuation worrisome for mass is also seen along the left side of the urinary bladder. There are no urinary tract stones.  Extensive calcific aortic and coronary atherosclerosis.  Diverticulosis without diverticulitis.   Electronically Signed  By: Inge Rise M.D.  On: 01/20/2016 15:08    CT scan reviewed personally today with the patient  Assessment & Plan:    1. 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 Pathology was discussed today in detail. Given the appearance of the mass on CT imaging with left ureteral obstruction, highly suspicious for muscle invasive disease, possiblely T3.  I have recommended further staging, debulking with transurethral resection of the bladder tumor to prove muscle invasion. Additionally, at like to attempt to evaluate the left distal ureter to assess for possible involvement.    Also recommended further staging with a chest CT to rule out metastatic disease.  Discussed risks and benefits in detail. All of her questions were answered today. We did briefly discuss the natural history of bladder cancer and expected treatment course recommended. Unfortunately, given her multiple medical comorbidities and functional status, she is relatively poor surgical candidate for cystectomy. We will  discuss this further once clinical/ pathology staging is complete.     Schedule TURBT, retrograde pyelogram, possible right ureteroscopy/ biopsy/ fulguration, left antegrade nephrostogram, exam under anesthesia.    - CT CHEST WO CONTRAST; Future - CULTURE, URINE COMPREHENSIVE  2. Hydronephrosis, left Secondary to obstructing mass Status post nephrostomy tube placement  3. Chronic kidney disease, stage IV (severe) (HCC) Baseline severe CKD, acutely worsened due to obstructive uropathy with return to baseline ~(2-2.25) with nephrostomy tube placement    4. History of UTI Needs preop UCx    Hollice Espy, MD  [facility] [address], [place], [place] [address] [phone]  I spent 40 min with this patient of which greater than 50% was spent in counseling and coordination of care with the patient.

## 2016-02-17 NOTE — Anesthesia Procedure Notes (Signed)
Procedure Name: Intubation Date/Time: 02/17/2016 12:13 PM Performed by: Rosaria Ferries, [place] Pre-anesthesia Checklist: Patient identified Patient Re-evaluated:Patient Re-evaluated prior to inductionOxygen Delivery Method: Circle system utilized Preoxygenation: Pre-oxygenation with 100% oxygen Intubation Type: IV induction Laryngoscope Size: Mac and 3 Grade View: Grade I Tube type: Oral Tube size: 7.0 mm Number of attempts: 1 Placement Confirmation: ETT inserted through vocal cords under direct vision,  positive ETCO2 and breath sounds checked- equal and bilateral Secured at: 21 cm Tube secured with: Tape Dental Injury: Teeth and Oropharynx as per pre-operative assessment

## 2016-02-17 NOTE — Telephone Encounter (Signed)
Patient's daughter is not one of our contacts for this patient. I confirmed with Mrs. Franca that she does not give permission for me to speak to her daughter.  Hollice Espy, MD

## 2016-02-17 NOTE — Addendum Note (Signed)
Addendum  created 02/17/16 1410 by Iver Nestle, MD   Order sets accessed

## 2016-02-17 NOTE — Anesthesia Preprocedure Evaluation (Signed)
Anesthesia Evaluation  Patient identified by MRN, date of birth, ID band Patient awake    Reviewed: Allergy & Precautions, NPO status , Patient's Chart, lab work & pertinent test results  Airway Mallampati: III       Dental  (+) Missing, Poor Dentition   Pulmonary sleep apnea and Continuous Positive Airway Pressure Ventilation , COPD,  COPD inhaler, former smoker,     + decreased breath sounds      Cardiovascular hypertension, + CAD   Rhythm:Regular     Neuro/Psych    GI/Hepatic GERD  Medicated,  Endo/Other  diabetes, Type 2, Oral Hypoglycemic AgentsMorbid obesity  Renal/GU      Musculoskeletal   Abdominal (+) + obese,   Peds  Hematology  (+) anemia ,   Anesthesia Other Findings   Reproductive/Obstetrics                             Anesthesia Physical Anesthesia Plan  ASA: III  Anesthesia Plan: General   Post-op Pain Management:    Induction: Intravenous  Airway Management Planned: Oral ETT  Additional Equipment:   Intra-op Plan:   Post-operative Plan: Extubation in OR  Informed Consent: I have reviewed the patients History and Physical, chart, labs and discussed the procedure including the risks, benefits and alternatives for the proposed anesthesia with the patient or authorized representative who has indicated his/her understanding and acceptance.     Plan Discussed with: CRNA  Anesthesia Plan Comments:         Anesthesia Quick Evaluation

## 2016-02-17 NOTE — Discharge Instructions (Signed)
Transurethral Resection of Bladder Tumor (TURBT) or Bladder Biopsy   Definition:  Transurethral Resection of the Bladder Tumor is a surgical procedure used to diagnose and remove tumors within the bladder. TURBT is the most common treatment for early stage bladder cancer.  General instructions:     Your recent bladder surgery requires very little post hospital care but some definite precautions.  Despite the fact that no skin incisions were used, the area around the bladder incisions are raw and covered with scabs to promote healing and prevent bleeding. Certain precautions are needed to insure that the scabs are not disturbed over the next 2-4 weeks while the healing proceeds.  Because the raw surface inside your bladder and the irritating effects of urine you may expect frequency of urination and/or urgency (a stronger desire to urinate) and perhaps even getting up at night more often. This will usually resolve or improve slowly over the healing period. You may see some blood in your urine over the first 6 weeks. Do not be alarmed, even if the urine was clear for a while. Get off your feet and drink lots of fluids until clearing occurs. If you start to pass clots or don't improve call us.  Diet:  You may return to your normal diet immediately. Because of the raw surface of your bladder, alcohol, spicy foods, foods high in acid and drinks with caffeine may cause irritation or frequency and should be used in moderation. To keep your urine flowing freely and avoid constipation, drink plenty of fluids during the day (8-10 glasses). Tip: Avoid cranberry juice because it is very acidic.  Activity:  Your physical activity doesn't need to be restricted. However, if you are very active, you may see some blood in the urine. We suggest that you reduce your activity under the circumstances until the bleeding has stopped.  Bowels:  It is important to keep your bowels regular during the postoperative  period. Straining with bowel movements can cause bleeding. A bowel movement every other day is reasonable. Use a mild laxative if needed, such as milk of magnesia 2-3 tablespoons, or 2 Dulcolax tablets. Call if you continue to have problems. If you had been taking narcotics for pain, before, during or after your surgery, you may be constipated. Take a laxative if necessary.    Medication:  You should resume your pre-surgery medications unless told not to. In addition you may be given an antibiotic to prevent or treat infection. Antibiotics are not always necessary. All medication should be taken as prescribed until the bottles are finished unless you are having an unusual reaction to one of the drugs.  Foley: Your Foley catheter may be removed in 7 days. This can be done at rehabilitation or at your follow up appointment.  [facility] [address] [place],  [address] [phone]  AMBULATORY SURGERY  DISCHARGE INSTRUCTIONS   1) The drugs that you were given will stay in your system until tomorrow so for the next 24 hours you should not:  A) Drive an automobile B) Make any legal decisions C) Drink any alcoholic beverage   2) You may resume regular meals tomorrow.  Today it is better to start with liquids and gradually work up to solid foods.   3) Please notify your doctor immediately if you have any unusual bleeding, trouble breathing, redness and pain at the surgery site, drainage, fever, or pain not relieved by medication.   Please contact your physician with any problems  or Same Day Surgery at [phone], Monday through Friday 6 am to 4 pm, or [place] at [facility] number at [phone].

## 2016-02-17 NOTE — Op Note (Signed)
Date of procedure: 02/17/16  Preoperative diagnosis:  1. Bladder cancer involving trigone 2. Left hydroureteronephrosis   Postoperative diagnosis:  1. Same as above   Procedure: 1. TURBT, 5 cm 2. Bilateral retrograde pyelogram 3. Left ureteral stent placement 4. Left nephrostomy tube removal 5. Exam under anesthesia  Surgeon: Hollice Espy, MD  Anesthesia: General  Complications: None  Intraoperative findings: Large nodular proximally 5 cm bladder mass involving the left hemitrigone extending up the left lateral bladder wall, 2 discrete nodules with bridge of abnormal tissue appreciated. Following resection of mass, left UO was able to be identified and cannulated.  Tumor clinically T2.  EBL: Minimal  Specimens: Bladder tumor  Drains: 18 French two-way Foley catheter, 6 x 24 French double-J Bard Optima ureteral stent  Indication: Barbara [name] is a 75 y.o. 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 She previously presented on a hospital admission for urosepsis and underwent left percutaneous nephrostomy tube placement and superficial biopsies. She returns today for more extensive biopsy and staging.  After reviewing the management options for treatment, she elected to proceed with the above surgical procedure(s).  She was treated appropriately for yeast in her urine prior to today's procedure. We have discussed the potential benefits and risks of the procedure, side effects of the proposed treatment, the likelihood of the patient achieving the goals of the procedure, and any potential problems that might occur during the procedure or recuperation. Informed consent has been obtained.  Description of procedure:  The patient was taken to the operating room and general anesthesia was induced.  The patient was placed in the dorsal lithotomy position, prepped and draped in the usual sterile  fashion, and preoperative antibiotics were administered. A preoperative time-out was performed.   A bimanual vaginal exam was performed at this point time at which time the nodular mass could be palpated on the anterior left vaginal wall. This did not appear to be fixed and was freely mobile. After resection at the end of case, the bimanual exam was repeated and no residual tumor was palpable.  At this point in time, a rigid 61 French resectoscope was advanced per urethra into the bladder. This point in time, the bladder was carefully inspected. This revealed some mild edema on the posterior wall consistent with catheter cystitis which have been removed prior to prepping the patient beginning of the case. A large nodular mass with 2 discrete nodules and a [place] abnormal tissue was seen involving the left hemitrigone extending up the left lateral wall measuring approximate 5 cm.  There were no other tumors or lesions within the bladder other than the aforementioned. The right UO was able to be identified but the left UO was distorted by the presence of the large tumor. At this point in time, the resectoscope loop was used to resect the tumor down to the level of the muscularis. Upon doing so, I was able to identify the left ureteral orifice which had normal urothelial mucosa and no concern for tumor within this structure. In some areas, the tumor was clearly able to be resected down to the muscularis without residual tumor, and other areas it was unclear whether or not the resection was complete.  Careful hemostasis was achieved using electrocautery with care taken to avoid any fulguration near the ureteral orifice itself.  At this point in time, the left ureteral orifice which was now visible was cannulated using a 5 [place] open-ended ureteral catheter.  A gentle retrograde pyelogram was performed which revealed a decompressed slightly tortuous left ureter and a decompressed upper tract collecting system.  There are no obvious filling defects. The left-sided nephrostomy tube could be seen inserting into the lower pole calyx. A wire was then successfully placed up to level of the upper pole calyx. A 6 x 24 French double-J Bard Optima ureteral stent was advanced over the wire and partially drawn until full coil was noted within the upper pole calyx. The wire was then fully retracted and a coil was noted within the bladder adjacent to the resection site.  Under fluoroscopic guidance, the left nephrostomy tube was removed and a new dressing was placed over this flank site.  Finally, the chips were irrigated from the bladder passed off the field as bladder tumor specimen. Once adequate hemostasis was achieved, the scope was removed. An 88 French Foley catheter was replaced. The urine remained relatively at this point.  Mitomycin was not used during this procedure today given the extent of the resection as well as likelihood of muscle invasive disease based on preoperative imaging and presence of hydronephrosis.  The patient was then cleaned and dried, repositioned the supine position, and taken to the PACU in stable condition.  Plan: Patient will have her catheter removed in approximately week to allow for bladder healing. She'll follow-up in my office shortly thereafter to review pathology report.  Hollice Espy, M.D.

## 2016-02-17 NOTE — Transfer of Care (Signed)
Immediate Anesthesia Transfer of Care Note  Patient: Barbara [name]  Procedure(s) Performed: Procedure(s): TRANSURETHRAL RESECTION OF BLADDER TUMOR (TURBT)-LARGE (N/A) URETEROSCOPY (Left) CYSTOSCOPY WITH RETROGRADE PYELOGRAM (Bilateral)  Patient Location: PACU  Anesthesia Type:General  Level of Consciousness: sedated  Airway & Oxygen Therapy: Patient Spontanous Breathing and Patient connected to face mask oxygen  Post-op Assessment: Report given to RN and Post -op Vital signs reviewed and stable  Post vital signs: Reviewed and stable  Last Vitals:  Vitals:   02/17/16 0929 02/17/16 1344  BP: 135/78 (!) 144/65  Pulse: (!) 57 100  Resp: 18 18  Temp: 36.8 C 23.7 C    Complications: No apparent anesthesia complications

## 2016-02-18 LAB — SURGICAL PATHOLOGY

## 2016-02-25 ENCOUNTER — Encounter: Payer: Self-pay | Admitting: Urology

## 2016-02-25 ENCOUNTER — Ambulatory Visit (INDEPENDENT_AMBULATORY_CARE_PROVIDER_SITE_OTHER): Payer: Medicare Other | Admitting: Urology

## 2016-02-25 VITALS — BP 122/72 | HR 99 | Ht 69.0 in | Wt 186.0 lb

## 2016-02-25 DIAGNOSIS — N184 Chronic kidney disease, stage 4 (severe): Secondary | ICD-10-CM

## 2016-02-25 DIAGNOSIS — C670 Malignant neoplasm of trigone of bladder: Secondary | ICD-10-CM

## 2016-02-25 DIAGNOSIS — N1330 Unspecified hydronephrosis: Secondary | ICD-10-CM

## 2016-02-25 NOTE — Progress Notes (Signed)
02/25/2016 3:24 PM   Barbara [name] 24-Sep-1940 [id number]  Referring provider: Madelyn Brunner, MD [facility] [place], [place] [address]  Chief Complaint  Patient presents with  . Results    pathology results    HPI: 75 year old female with newly diagnosed muscle invasive bladder cancer who returns today following TURBT, left ureteral stent placement to review pathology.    She initally presented with obstructing left UVJ mass in the setting of urosepsis on 01/21/2016.  Prior to this admission, she did have a previous admission for Escherichia coli sepsis with associated left flank and lower abdominal pain.  She was taken to the operating room by Dr. Diona Fanti at which time a nodular tumor involving the left hemitrigone was appreciated. 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 Due to the distortion of the trigone, the ureteral stent was unable to be placed. She subsequently developed fevers and underwent left percutaneous nephrostomy tube after being transferred to [facility].  She returned to the OR on 02/17/16 for TURBT, B RTG, EUA, and ureteral stent placement.  Pathology consistent with high grade muscle invasive bladder cancer.  Her left UO was able to identified upon resection and stent placed, nephrostomy tube removed.  Clinically T2 on exam under anesthesia.    She does have multiple medical comorbidities including stage IV CKD with recent acute on chronic acute kidney injury, COPD, diabetes, hypertension, OSA.    Smoked 2 ppd, quit ~97.  Foley catheter remains in place today.    She is currently still residing at [facility]. Prior to her recent critical illnesses, she was living at home with her husband, cooking and cleaning for herself. She does have some dyspnea on exertion at baseline.  Poor functional status.    PMH: Past  Medical History:  Diagnosis Date  . Anemia associated with chronic renal failure   . Arthritis   . CKD (chronic kidney disease)   . COPD (chronic obstructive pulmonary disease) ([place])   . Diabetes mellitus without complication ([place])   . Hypertension   . Sleep apnea    uses cpap  . Urinary obstruction 01/2016    Surgical History: Past Surgical History:  Procedure Laterality Date  . ABDOMINAL HYSTERECTOMY    . CYSTOSCOPY W/ RETROGRADES Bilateral 02/17/2016   Procedure: CYSTOSCOPY WITH RETROGRADE PYELOGRAM;  Surgeon: Hollice Espy, MD;  Location: [facility] ORS;  Service: Urology;  Laterality: Bilateral;  . CYSTOSCOPY WITH STENT PLACEMENT Left 01/21/2016   Procedure: CYSTOSCOPY WITH double J STENT PLACEMENT;  Surgeon: Franchot Gallo, MD;  Location: [facility] ORS;  Service: Urology;  Laterality: Left;  . KIDNEY SURGERY  01/21/2016   IR NEPHROSTOMY PLACEMENT LEFT   . ROTATOR CUFF REPAIR     left  . TRANSURETHRAL RESECTION OF BLADDER TUMOR N/A 02/17/2016   Procedure: TRANSURETHRAL RESECTION OF BLADDER TUMOR (TURBT)-LARGE;  Surgeon: Hollice Espy, MD;  Location: [facility] ORS;  Service: Urology;  Laterality: N/A;  . URETEROSCOPY Left 02/17/2016   Procedure: URETEROSCOPY;  Surgeon: Hollice Espy, MD;  Location: [facility] ORS;  Service: Urology;  Laterality: Left;    Home Medications:    Medication List       Accurate as of 02/25/16 11:59 PM. Always use your most recent med list.          amLODipine 5 MG tablet Commonly known as:  NORVASC Take 1 tablet (5 mg total) by mouth daily.   aspirin 81  MG tablet Take 81 mg by mouth 2 (two) times daily.   atorvastatin 40 MG tablet Commonly known as:  LIPITOR Take 40 mg by mouth daily.   barrier cream Crea Commonly known as:  non-specified Apply 1 application topically 3 (three) times daily as needed (every shift).   cyanocobalamin 1000 MCG tablet Take 1 tablet (1,000 mcg total) by mouth daily.   famotidine 20 MG tablet Commonly known as:   PEPCID Take 1 tablet (20 mg total) by mouth daily.   ferrous sulfate 325 (65 FE) MG tablet Take 1 tablet (325 mg total) by mouth 2 (two) times daily with a meal.   fluconazole 100 MG tablet Commonly known as:  DIFLUCAN Take 1 tablet daily starting 5 days prior to surgery.   folic acid 1 MG tablet Commonly known as:  FOLVITE Take 1 tablet (1 mg total) by mouth daily.   gabapentin 100 MG capsule Commonly known as:  NEURONTIN Take 100 mg by mouth 2 (two) times daily.   HYDROcodone-acetaminophen 5-325 MG tablet Commonly known as:  NORCO/VICODIN Take 1-2 tablets by mouth every 6 (six) hours as needed for moderate pain.   insulin aspart 100 UNIT/ML injection Commonly known as:  novoLOG Inject 0-5 Units into the skin at bedtime. If BS <60 call MD, if BS is 70-200=0u, 201-250=2u, 251-300=3u, 301-350=4u, 351-400=5u, if >450 call MD   insulin aspart 100 UNIT/ML injection Commonly known as:  novoLOG Inject 0-15 Units into the skin 3 (three) times daily with meals.   NON FORMULARY Place 1 Units into the nose at bedtime. CPAP Time of use 3220-2542   PROAIR RESPICLICK 706 (90 Base) MCG/ACT Aepb Generic drug:  Albuterol Sulfate Inhale 2 puffs into the lungs 4 (four) times daily as needed (wheezing).   Travoprost (BAK Free) 0.004 % Soln ophthalmic solution Commonly known as:  TRAVATAN Place 1 drop into both eyes at bedtime.   Vitamin D-3 1000 units Caps Take 1 capsule by mouth every morning.       Allergies:  Allergies  Allergen Reactions  . Percocet [Oxycodone-Acetaminophen] Hives    Family History: Family History  Problem Relation Age of Onset  . Diabetes Mother   . Cancer Father   . Breast cancer Neg Hx   . Kidney disease Neg Hx     Social History:  reports that she quit smoking about 22 years ago. She has never used smokeless tobacco. She reports that she does not drink alcohol or use drugs.  ROS:  12 point review of systems form, negative other than as per history  of present illness.   Physical Exam: BP 122/72   Pulse 99   Ht 5' 9"  (1.753 m)   Wt 186 lb (84.4 kg)   BMI 27.47 kg/m   Constitutional:  Alert and oriented, No acute distress.  In wheelchair accompanied by husband today. HEENT: [place] AT, moist mucus membranes.  Trachea midline, no masses. Cardiovascular: No clubbing, cyanosis, or edema.   Respiratory: Normal respiratory effort, no increased work of breathing.   GI: Abdomen is soft, nontender, nondistended, no abdominal masses GU: No CVA tenderness. Previous left nephrostomy site well-healed, small scab appreciated.. Foley catheter in place draining slightly cloudy yellow urine. Skin: No rashes, bruises or suspicious lesions. Neurologic: Grossly intact, no focal deficits, moving all 4 extremities. LE weakness appreciated.   Psychiatric: Normal mood and affect.  Laboratory Data: Lab Results  Component Value Date   WBC 14.4 (H) 02/07/2016   HGB 8.8 (L) 02/07/2016  HCT 27.6 (L) 02/07/2016   MCV 84.8 02/07/2016   PLT 468 (H) 02/07/2016    Lab Results  Component Value Date   CREATININE 1.96 (H) 02/07/2016     Lab Results  Component Value Date   HGBA1C 6.5 (H) 01/20/2016    UrinalysisCatheter plug today for urine specimen collection, ultimately unable to collect sample with time constraints from transport  Pertinent Imaging: Study Result     CLINICAL DATA: Left flank pain today. No known injury.  EXAM: CT ABDOMEN AND PELVIS WITHOUT CONTRAST  TECHNIQUE: Multidetector CT imaging of the abdomen and pelvis was performed following the standard protocol without IV contrast.  COMPARISON: CT abdomen and pelvis 10/03/2014.  FINDINGS: There is cardiomegaly. Calcific aortic and coronary atherosclerosis is identified. No pleural or pericardial effusion. Mild dependent atelectasis is noted.  There is mild to moderate left hydronephrosis on the left with dilatation of the left ureter identified. There is abnormal  soft tissue attenuation at the left ureterovesical junction and along the left side of the urinary bladder worrisome for carcinoma. No urinary tract stones are identified on the right or left. Exophytic cyst off the left kidney measuring 5.7 cm in diameter is unchanged.  The liver, gallbladder, adrenal glands, spleen and pancreas are unremarkable. No lymphadenopathy or fluid is seen in the abdomen or pelvis. Extensive aortoiliac atherosclerosis without aneurysm is identified. The patient is status post hysterectomy.  Scattered colonic diverticula are seen but there is no evidence diverticulitis. The colon is otherwise normal appearance. The stomach, small bowel and appendix appear normal.  No lytic or sclerotic bony lesion is seen. Mild appearing lower lumbar spondylosis is noted.  IMPRESSION: Mild to moderate left hydroureteronephrosis due to a soft tissue mass at the left UVJ worrisome for carcinoma. Soft tissue attenuation worrisome for mass is also seen along the left side of the urinary bladder. There are no urinary tract stones.  Extensive calcific aortic and coronary atherosclerosis.  Diverticulosis without diverticulitis.   Electronically Signed  By: Inge Rise M.D.  On: 01/20/2016 15:08    CT reviewed again today  Assessment & Plan:    1. Malignant neoplasm of trigone of urinary bladder [facility]) Pathology discussed, clinical T2 HgTCC with squamous differentiation obstructed left ureter status post conversion of nephrostomy tube to double-J stent.  Foley removed today.  Options moving forward were discussed in detail today and conversation was lengthy. Given her overall medical comorbidities and functional status, do not feel that she is a good candidate for cystectomy. She was offered a referral to a tertiary care center for second opinion but declined. I recommended further treatment with radiation and chemotherapy as well as possibly re-resection as  needed pending response. We discussed that chemotherapy trimodal therapy is slightly less efficacious but given her medical comorbidities, they will be best tolerated. The natural history of muscle invasive bladder cancer was discussed in detail. All of her questions including her husbands were answered.  Incidentally, the patient is not interested in having her children are involved in this discussion.  Referral to radiation oncology and medical oncology placed today.   Will also plan to discuss patient at multidisciplinary tumor board.    2. Hydronephrosis, left Secondary to obstructing mass Bard Optima left double-J stent remains in place, we'll reassess whether or not to remove this pending decision for further treatment.  3. Chronic kidney disease, stage IV (severe) (HCC) Baseline severe CKD, acutely worsened due to obstructive uropathy with return to baseline ~(2-2.25) with nephrostomy tube placement  Will limit chemotherapy options- defer to cancer center  Return in about 4 weeks (around 03/24/2016) for f/u after medical oncology and radiation oncology appt.  Hollice Espy, MD  [facility] [address] [place], [place] [address] [phone]  I spent 40 min with this patient of which greater than 50% was spent in counseling and coordination of care with the patient.

## 2016-02-25 NOTE — Progress Notes (Signed)
Catheter Removal  Patient is present today for a catheter removal.  34m of water was drained from the balloon. A 18FR foley cath was removed from the bladder no complications were noted . Patient tolerated well.  Preformed by: CElberta Leatherwood CMA

## 2016-02-28 ENCOUNTER — Ambulatory Visit: Payer: Medicare Other | Admitting: Urology

## 2016-03-02 ENCOUNTER — Ambulatory Visit
Admission: RE | Admit: 2016-03-02 | Discharge: 2016-03-02 | Disposition: A | Discharge status: Home / Self Care | Payer: Medicare Other | Source: Ambulatory Visit | Attending: Radiation Oncology | Admitting: Radiation Oncology

## 2016-03-02 ENCOUNTER — Inpatient Hospital Stay: Payer: Medicare Other | Admitting: Internal Medicine

## 2016-03-02 ENCOUNTER — Encounter: Payer: Self-pay | Admitting: Radiation Oncology

## 2016-03-02 VITALS — BP 104/53 | HR 105 | Temp 98.5°F | Resp 20

## 2016-03-02 DIAGNOSIS — C670 Malignant neoplasm of trigone of bladder: Secondary | ICD-10-CM

## 2016-03-02 NOTE — Progress Notes (Signed)
  Oncology Nurse Navigator Documentation  Navigator Location: [facility]Med Onc (03/02/16 1300) Navigator Encounter Type: Initial RadOnc (03/02/16 1300)   Abnormal Finding Date: 01/20/16 (03/02/16 1300) Confirmed Diagnosis Date: 01/24/16 (03/02/16 1300) Surgery Date: 02/17/16 (03/02/16 1300) Treatment Initiated Date: 02/17/16 (03/02/16 1300) Patient Visit Type: RadOnc;Initial (03/02/16 1300) Treatment Phase: Pre-Tx/Tx Discussion (03/02/16 1300) Barriers/Navigation Needs: Coordination of Care (03/02/16 1300)   Interventions: Coordination of Care (03/02/16 1300)            Acuity: Level 3 (03/02/16 1300)   Acuity Level 2: Initial guidance, education and coordination as needed;Educational needs;Assistance expediting appointments;Ongoing guidance and education throughout treatment as needed (03/02/16 1300) Acuity Level 3: Transportation issues (03/02/16 1300)   Time Spent with Patient: 30 (03/02/16 1300)   Met with patient and spouse along with Dr Baruch Gouty for radiation consult. She reports she is now home from [facility]. Spouse has assumed caregiver role with cooking, cleaning, bills, etc.. Difficulty with ambulating related to missing knee injection recently. This is scheduled for this week.  Dr Kirt Boys is planning on 5 weeks XRT. Spouse mentioned he will see if family can help with these appts. Instructed him to notify us if transportation becomes an issue. Unable to keep appt with Dr Rogue Bussing today. Appt being rescheduled. Provided them with my contact information for future questions or concerns.

## 2016-03-02 NOTE — Progress Notes (Unsigned)
[place] NOTE  Patient Care Team: Madelyn Brunner, MD as PCP - General (Internal Medicine)  CHIEF COMPLAINTS/PURPOSE OF CONSULTATION:  ***  #   No history exists.     HISTORY OF PRESENTING ILLNESS:  Barbara [name] 75 y.o.  female     ROS: A complete 10 point review of system is done which is negative except mentioned above in history of present illness  MEDICAL HISTORY:  Past Medical History:  Diagnosis Date  . Anemia associated with chronic renal failure   . Arthritis   . CKD (chronic kidney disease)   . COPD (chronic obstructive pulmonary disease) ([place])   . Diabetes mellitus without complication ([place])   . Hypertension   . Sleep apnea    uses cpap  . Urinary obstruction 01/2016    SURGICAL HISTORY: Past Surgical History:  Procedure Laterality Date  . ABDOMINAL HYSTERECTOMY    . CYSTOSCOPY W/ RETROGRADES Bilateral 02/17/2016   Procedure: CYSTOSCOPY WITH RETROGRADE PYELOGRAM;  Surgeon: Hollice Espy, MD;  Location: [facility] ORS;  Service: Urology;  Laterality: Bilateral;  . CYSTOSCOPY WITH STENT PLACEMENT Left 01/21/2016   Procedure: CYSTOSCOPY WITH double J STENT PLACEMENT;  Surgeon: Franchot Gallo, MD;  Location: [facility] ORS;  Service: Urology;  Laterality: Left;  . KIDNEY SURGERY  01/21/2016   IR NEPHROSTOMY PLACEMENT LEFT   . ROTATOR CUFF REPAIR     left  . TRANSURETHRAL RESECTION OF BLADDER TUMOR N/A 02/17/2016   Procedure: TRANSURETHRAL RESECTION OF BLADDER TUMOR (TURBT)-LARGE;  Surgeon: Hollice Espy, MD;  Location: [facility] ORS;  Service: Urology;  Laterality: N/A;  . URETEROSCOPY Left 02/17/2016   Procedure: URETEROSCOPY;  Surgeon: Hollice Espy, MD;  Location: [facility] ORS;  Service: Urology;  Laterality: Left;    SOCIAL HISTORY: Social History   Social History  . Marital status: Married    Spouse name: N/A  . Number of children: N/A  . Years of education: N/A   Occupational History  . retired    Social History Main Topics  .  Smoking status: Former Smoker    Quit date: 07/20/1993  . Smokeless tobacco: Never Used     Comment: 01/22/2016   "  quit smoking many years ago "  . Alcohol use No  . Drug use: No  . Sexual activity: Not on file   Other Topics Concern  . Not on file   Social History Narrative  . No narrative on file    FAMILY HISTORY: Family History  Problem Relation Age of Onset  . Diabetes Mother   . Cancer Father   . Breast cancer Neg Hx   . Kidney disease Neg Hx     ALLERGIES:  is allergic to percocet [oxycodone-acetaminophen].  MEDICATIONS:  Current Outpatient Prescriptions  Medication Sig Dispense Refill  . Albuterol Sulfate (PROAIR RESPICLICK) 488 (90 Base) MCG/ACT AEPB Inhale 2 puffs into the lungs 4 (four) times daily as needed (wheezing).     Marland Kitchen amLODipine (NORVASC) 5 MG tablet Take 1 tablet (5 mg total) by mouth daily. 30 tablet 0  . aspirin 81 MG tablet Take 81 mg by mouth 2 (two) times daily.    Marland Kitchen atorvastatin (LIPITOR) 40 MG tablet Take 40 mg by mouth daily.    . barrier cream (NON-SPECIFIED) CREA Apply 1 application topically 3 (three) times daily as needed (every shift).    . Cholecalciferol (VITAMIN D-3) 1000 units CAPS Take 1 capsule by mouth every morning.    . famotidine (PEPCID) 20 MG  tablet Take 1 tablet (20 mg total) by mouth daily. 30 tablet 0  . ferrous sulfate 325 (65 FE) MG tablet Take 1 tablet (325 mg total) by mouth 2 (two) times daily with a meal. 60 tablet 0  . fluconazole (DIFLUCAN) 100 MG tablet Take 1 tablet daily starting 5 days prior to surgery. (Patient not taking: Reported on 02/25/2016) 7 tablet 0  . folic acid (FOLVITE) 1 MG tablet Take 1 tablet (1 mg total) by mouth daily. 30 tablet 0  . gabapentin (NEURONTIN) 100 MG capsule Take 100 mg by mouth 2 (two) times daily.    Marland Kitchen HYDROcodone-acetaminophen (NORCO/VICODIN) 5-325 MG tablet Take 1-2 tablets by mouth every 6 (six) hours as needed for moderate pain. (Patient not taking: Reported on 02/25/2016) 15 tablet 0   . insulin aspart (NOVOLOG) 100 UNIT/ML injection Inject 0-15 Units into the skin 3 (three) times daily with meals. (Patient taking differently: Inject 0-15 Units into the skin 3 (three) times daily with meals. If BS <60 call MD, if BS is 70-120=0u, 121-150=2u, 151-200=3, 201-250=5u, 251-300=8u, 301-350=11u, 351-400=15u, if >450 call MD) 10 mL 0  . insulin aspart (NOVOLOG) 100 UNIT/ML injection Inject 0-5 Units into the skin at bedtime. If BS <60 call MD, if BS is 70-200=0u, 201-250=2u, 251-300=3u, 301-350=4u, 351-400=5u, if >450 call MD    . Insulin Glargine (LANTUS SOLOSTAR) 100 UNIT/ML Solostar Pen INJECT 38 UNITS SUBQ EVERY MORNING AND 36 UNITS EVERY EVENING    . NON FORMULARY Place 1 Units into the nose at bedtime. CPAP Time of use 2100-0600    . Travoprost, BAK Free, (TRAVATAN) 0.004 % SOLN ophthalmic solution Place 1 drop into both eyes at bedtime.    . vitamin B-12 1000 MCG tablet Take 1 tablet (1,000 mcg total) by mouth daily. (Patient not taking: Reported on 02/07/2016) 30 tablet 0   No current facility-administered medications for this visit.       Marland Kitchen  PHYSICAL EXAMINATION: ECOG PERFORMANCE STATUS: {CHL ONC ECOG [phone]}  There were no vitals filed for this visit. There were no vitals filed for this visit.  GENERAL: Well-nourished well-developed; Alert, no distress and comfortable.   *** EYES: no pallor or icterus OROPHARYNX: no thrush or ulceration; good dentition  NECK: supple, no masses felt LYMPH:  no palpable lymphadenopathy in the cervical, axillary or inguinal regions LUNGS: clear to auscultation and  No wheeze or crackles HEART/CVS: regular rate & rhythm and no murmurs; No lower extremity edema ABDOMEN: abdomen soft, non-tender and normal bowel sounds Musculoskeletal:no cyanosis of digits and no clubbing  PSYCH: alert & oriented x 3 with fluent speech NEURO: no focal motor/sensory deficits SKIN:  no rashes or significant lesions  LABORATORY DATA:  I have  reviewed the data as listed Lab Results  Component Value Date   WBC 14.4 (H) 02/07/2016   HGB 8.8 (L) 02/07/2016   HCT 27.6 (L) 02/07/2016   MCV 84.8 02/07/2016   PLT 468 (H) 02/07/2016    Recent Labs  01/20/16 1303  01/23/16 0350  01/25/16 0553 01/26/16 0532 01/27/16 0858 02/07/16 1258  NA 138  < > 140  < > 138  140 139 139 138  K 4.2  < > 4.7  < > 3.9  4.0 3.8 3.6 4.8  CL 107  < > 115*  < > 108  108 109 108 107  CO2 19*  < > 18*  < > 21*  23 21* 19* 20*  GLUCOSE 84  < > 138*  < >  134*  133* 133* 143* 96  BUN 60*  < > 29*  < > 22*  22* 27* 33* 44*  CREATININE 4.89*  < > 2.66*  < > 1.92*  1.93* 1.98* 2.25* 1.96*  CALCIUM 8.6*  < > 9.1  < > 9.7  9.8 9.8 9.5 10.1  GFRNONAA 8*  < > 16*  < > 24*  24* 23* 20* 24*  GFRAA 9*  < > 19*  < > 28*  28* 27* 23* 27*  PROT 8.1  --  6.9  --  8.9*  --   --   --   ALBUMIN 3.5  --  2.5*  < > 2.6*  2.6* 2.7* 2.7*  --   AST 17  --  17  --  36  --   --   --   ALT 13*  --  15  --  36  --   --   --   ALKPHOS 70  --  82  --  83  --   --   --   BILITOT 0.3  --  0.6  --  0.4  --   --   --   < > = values in this interval not displayed.  RADIOGRAPHIC STUDIES: I have personally reviewed the radiological images as listed and agreed with the findings in the report. No results found.  ASSESSMENT & PLAN:   No problem-specific Assessment & Plan notes found for this encounter.    All questions were answered. The patient knows to call the clinic with any problems, questions or concerns.       Cammie Sickle, MD 03/02/2016 1:25 PM

## 2016-03-02 NOTE — Consult Note (Signed)
Except an outstanding is perfect of Radiation Oncology NEW PATIENT EVALUATION  Name: Barbara [name]  MRN: [id number]  Date:   03/02/2016     DOB: 09-14-1940   This 75 y.o. 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  REFERRING PHYSICIAN: Madelyn Brunner, MD  CHIEF COMPLAINT:  Chief Complaint  Patient presents with  . Bladder Cancer    Pt is here for initial consultation of bladder cancer.      DIAGNOSIS: The encounter diagnosis was Malignant neoplasm of trigone of urinary bladder ([place]).   PREVIOUS INVESTIGATIONS:  CT scans reviewed Pathology reports reviewed Clinical notes reviewed  HPI: Patient is a 75 year old female who presented back in early July 2017 with urosepsis was found to have a mass at the left UVJ. 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 She had a left percutaneous nephrostomy tube placed which was switched for ureteral stent laced cement on 02/17/2016. At that time pathology was consistent again with high-grade invasive bladder cancer with muscularis propria invasion. Patient has significant comorbidities with acute and chronic renal disease COPD adult-onset diabetes hypertension. [name] not thought to be a candidate for radical cystectomy and is now referred to both radiation oncology and medical oncology for opinion. She is had multiple bouts of Escherichia coli and does have a history of chronic diarrhea.  PLANNED TREATMENT REGIMEN: Concurrent chemoradiation  PAST MEDICAL HISTORY:  has a past medical history of Anemia associated with chronic renal failure; Arthritis; CKD (chronic kidney disease); COPD (chronic obstructive pulmonary disease) ([place]); Diabetes mellitus without complication ([place]); Hypertension; Sleep apnea; and Urinary obstruction  (01/2016).    PAST SURGICAL HISTORY:  Past Surgical History:  Procedure Laterality Date  . ABDOMINAL HYSTERECTOMY    . CYSTOSCOPY W/ RETROGRADES Bilateral 02/17/2016   Procedure: CYSTOSCOPY WITH RETROGRADE PYELOGRAM;  Surgeon: Hollice Espy, MD;  Location: [facility];  Service: Urology;  Laterality: Bilateral;  . CYSTOSCOPY WITH STENT PLACEMENT Left 01/21/2016   Procedure: CYSTOSCOPY WITH double J STENT PLACEMENT;  Surgeon: Franchot Gallo, MD;  Location: [facility];  Service: Urology;  Laterality: Left;  . KIDNEY SURGERY  01/21/2016   IR NEPHROSTOMY PLACEMENT LEFT   . ROTATOR CUFF REPAIR     left  . TRANSURETHRAL RESECTION OF BLADDER TUMOR N/A 02/17/2016   Procedure: TRANSURETHRAL RESECTION OF BLADDER TUMOR (TURBT)-LARGE;  Surgeon: Hollice Espy, MD;  Location: [facility];  Service: Urology;  Laterality: N/A;  . URETEROSCOPY Left 02/17/2016   Procedure: URETEROSCOPY;  Surgeon: Hollice Espy, MD;  Location: [facility];  Service: Urology;  Laterality: Left;    FAMILY HISTORY: family history includes Cancer in her father; Diabetes in her mother.  SOCIAL HISTORY:  reports that she quit smoking about 22 years ago. She has never used smokeless tobacco. She reports that she does not drink alcohol or use drugs.  ALLERGIES: Percocet [oxycodone-acetaminophen]  MEDICATIONS:  Current Outpatient Prescriptions  Medication Sig Dispense Refill  . Albuterol Sulfate (PROAIR RESPICLICK) 742 (90 Base) MCG/ACT AEPB Inhale 2 puffs into the lungs 4 (four) times daily as needed (wheezing).     Marland Kitchen amLODipine (NORVASC) 5 MG tablet Take 1 tablet (5 mg total) by mouth daily. 30 tablet 0  . aspirin 81 MG tablet Take 81 mg by mouth 2 (two) times daily.    Marland Kitchen atorvastatin (LIPITOR) 40 MG tablet  Take 40 mg by mouth daily.    . barrier cream (NON-SPECIFIED) CREA Apply 1 application topically 3 (three) times daily as needed (every shift).    . Cholecalciferol (VITAMIN D-3) 1000 units CAPS Take 1 capsule by mouth every  morning.    . famotidine (PEPCID) 20 MG tablet Take 1 tablet (20 mg total) by mouth daily. 30 tablet 0  . ferrous sulfate 325 (65 FE) MG tablet Take 1 tablet (325 mg total) by mouth 2 (two) times daily with a meal. 60 tablet 0  . folic acid (FOLVITE) 1 MG tablet Take 1 tablet (1 mg total) by mouth daily. 30 tablet 0  . gabapentin (NEURONTIN) 100 MG capsule Take 100 mg by mouth 2 (two) times daily.    . insulin aspart (NOVOLOG) 100 UNIT/ML injection Inject 0-15 Units into the skin 3 (three) times daily with meals. (Patient taking differently: Inject 0-15 Units into the skin 3 (three) times daily with meals. If BS <60 call MD, if BS is 70-120=0u, 121-150=2u, 151-200=3, 201-250=5u, 251-300=8u, 301-350=11u, 351-400=15u, if >450 call MD) 10 mL 0  . insulin aspart (NOVOLOG) 100 UNIT/ML injection Inject 0-5 Units into the skin at bedtime. If BS <60 call MD, if BS is 70-200=0u, 201-250=2u, 251-300=3u, 301-350=4u, 351-400=5u, if >450 call MD    . Insulin Glargine (LANTUS SOLOSTAR) 100 UNIT/ML Solostar Pen INJECT 38 UNITS SUBQ EVERY MORNING AND 36 UNITS EVERY EVENING    . NON FORMULARY Place 1 Units into the nose at bedtime. CPAP Time of use 2100-0600    . Travoprost, BAK Free, (TRAVATAN) 0.004 % SOLN ophthalmic solution Place 1 drop into both eyes at bedtime.    . fluconazole (DIFLUCAN) 100 MG tablet Take 1 tablet daily starting 5 days prior to surgery. (Patient not taking: Reported on 02/25/2016) 7 tablet 0  . HYDROcodone-acetaminophen (NORCO/VICODIN) 5-325 MG tablet Take 1-2 tablets by mouth every 6 (six) hours as needed for moderate pain. (Patient not taking: Reported on 02/25/2016) 15 tablet 0  . vitamin B-12 1000 MCG tablet Take 1 tablet (1,000 mcg total) by mouth daily. (Patient not taking: Reported on 02/07/2016) 30 tablet 0   No current facility-administered medications for this encounter.     ECOG PERFORMANCE STATUS:  0 - Asymptomatic  REVIEW OF SYSTEMS: Patient is extremely poor historian review of  systems obtained from nurses notes and family  Patient denies any weight loss, fatigue, weakness, fever, chills or night sweats. Patient denies any loss of vision, blurred vision. Patient denies any ringing  of the ears or hearing loss. No irregular heartbeat. Patient denies heart murmur or history of fainting. Patient denies any chest pain or pain radiating to her upper extremities. Patient denies any shortness of breath, difficulty breathing at night, cough or hemoptysis. Patient denies any swelling in the lower legs. Patient denies any nausea vomiting, vomiting of blood, or coffee ground material in the vomitus. Patient denies any stomach pain. Patient states has had normal bowel movements no significant constipation or diarrhea. Patient denies any dysuria, hematuria or significant nocturia. Patient denies any problems walking, swelling in the joints or loss of balance. Patient denies any skin changes, loss of hair or loss of weight. Patient denies any excessive worrying or anxiety or significant depression. Patient denies any problems with insomnia. Patient denies excessive thirst, polyuria, polydipsia. Patient denies any swollen glands, patient denies easy bruising or easy bleeding. Patient denies any recent infections, allergies or URI. Patient "s visual fields have not changed significantly in recent time.  PHYSICAL EXAM: BP (!) 104/53   Pulse (!) 105   Temp 98.5 F (36.9 C)   Resp 77  Wheelchair-bound female in NAD. Well-developed well-nourished patient in NAD. HEENT reveals PERLA, EOMI, discs not visualized.  Oral cavity is clear. No oral mucosal lesions are identified. Neck is clear without evidence of cervical or supraclavicular adenopathy. Lungs are clear to A&P. Cardiac examination is essentially unremarkable with regular rate and rhythm without murmur rub or thrill. Abdomen is benign with no organomegaly or masses noted. Motor sensory and DTR levels are equal and symmetric in the upper  and lower extremities. Cranial nerves II through XII are grossly intact. Proprioception is intact. No peripheral adenopathy or edema is identified. No motor or sensory levels are noted. Crude visual fields are within normal range.  LABORATORY DATA: Pathology reports reviewed    RADIOLOGY RESULTS: CT scans reviewed   IMPRESSION: At least T2 muscle invading bladder cancer in 75 year old female with significant comorbidities for concurrent chemoradiation  PLAN: At this time like to go ahead with concurrent chemoradiation. I would plan on delivering 4500 cGy to a large field bladder not encompassing the whole pelvis since the patient has a significant history for diarrhea and will try to alleviate small bowel and colon: Exposure. I would then boost her lateral another 2000 cGy using partial bladder field dependent on her initial CT scans and original tumor volume. Risks and benefits of treatment including exacerbation of diarrhea, increased lower urinary tract symptoms, fatigue, alteration of blood counts, skin reaction all were described to the patient. She seems to comprehend my treatment plan well. I have set up personally her CT simulation for tomorrow. We'll coordinate chemotherapy with medical oncology.There will be extra effort by both professional staff as well as technical staff to coordinate and manage concurrent chemoradiation and ensuing side effects during his treatments.  I would like to take this opportunity to thank you for allowing me to participate in the care of your patient.Armstead Peaks., MD

## 2016-03-03 ENCOUNTER — Ambulatory Visit
Admission: RE | Admit: 2016-03-03 | Discharge: 2016-03-03 | Disposition: A | Discharge status: Home / Self Care | Payer: Medicare Other | Source: Ambulatory Visit | Attending: Radiation Oncology | Admitting: Radiation Oncology

## 2016-03-04 ENCOUNTER — Emergency Department: Payer: Medicare Other

## 2016-03-04 ENCOUNTER — Encounter: Payer: Self-pay | Admitting: Emergency Medicine

## 2016-03-04 ENCOUNTER — Inpatient Hospital Stay
Admission: EM | Admit: 2016-03-04 | Discharge: 2016-03-11 | DRG: 871 | Disposition: A | Discharge status: Skilled Nursing Facility | Payer: Medicare Other | Attending: Internal Medicine | Admitting: Internal Medicine

## 2016-03-04 ENCOUNTER — Inpatient Hospital Stay: Payer: Medicare Other

## 2016-03-04 DIAGNOSIS — A419 Sepsis, unspecified organism: Principal | ICD-10-CM | POA: Diagnosis present

## 2016-03-04 DIAGNOSIS — R627 Adult failure to thrive: Secondary | ICD-10-CM | POA: Diagnosis present

## 2016-03-04 DIAGNOSIS — E872 Acidosis, unspecified: Secondary | ICD-10-CM

## 2016-03-04 DIAGNOSIS — N184 Chronic kidney disease, stage 4 (severe): Secondary | ICD-10-CM | POA: Diagnosis present

## 2016-03-04 DIAGNOSIS — A047 Enterocolitis due to Clostridium difficile: Secondary | ICD-10-CM | POA: Diagnosis present

## 2016-03-04 DIAGNOSIS — Z87891 Personal history of nicotine dependence: Secondary | ICD-10-CM | POA: Diagnosis not present

## 2016-03-04 DIAGNOSIS — N189 Chronic kidney disease, unspecified: Secondary | ICD-10-CM | POA: Diagnosis not present

## 2016-03-04 DIAGNOSIS — G4733 Obstructive sleep apnea (adult) (pediatric): Secondary | ICD-10-CM | POA: Diagnosis present

## 2016-03-04 DIAGNOSIS — I959 Hypotension, unspecified: Secondary | ICD-10-CM

## 2016-03-04 DIAGNOSIS — I248 Other forms of acute ischemic heart disease: Secondary | ICD-10-CM | POA: Diagnosis present

## 2016-03-04 DIAGNOSIS — Z79899 Other long term (current) drug therapy: Secondary | ICD-10-CM | POA: Diagnosis not present

## 2016-03-04 DIAGNOSIS — C679 Malignant neoplasm of bladder, unspecified: Secondary | ICD-10-CM | POA: Diagnosis not present

## 2016-03-04 DIAGNOSIS — N390 Urinary tract infection, site not specified: Secondary | ICD-10-CM | POA: Diagnosis present

## 2016-03-04 DIAGNOSIS — E876 Hypokalemia: Secondary | ICD-10-CM | POA: Diagnosis present

## 2016-03-04 DIAGNOSIS — F0390 Unspecified dementia without behavioral disturbance: Secondary | ICD-10-CM | POA: Diagnosis present

## 2016-03-04 DIAGNOSIS — R6251 Failure to thrive (child): Secondary | ICD-10-CM

## 2016-03-04 DIAGNOSIS — J449 Chronic obstructive pulmonary disease, unspecified: Secondary | ICD-10-CM | POA: Diagnosis present

## 2016-03-04 DIAGNOSIS — N179 Acute kidney failure, unspecified: Secondary | ICD-10-CM

## 2016-03-04 DIAGNOSIS — R112 Nausea with vomiting, unspecified: Secondary | ICD-10-CM | POA: Diagnosis not present

## 2016-03-04 DIAGNOSIS — D631 Anemia in chronic kidney disease: Secondary | ICD-10-CM | POA: Diagnosis present

## 2016-03-04 DIAGNOSIS — A0472 Enterocolitis due to Clostridium difficile, not specified as recurrent: Secondary | ICD-10-CM

## 2016-03-04 DIAGNOSIS — Z794 Long term (current) use of insulin: Secondary | ICD-10-CM | POA: Diagnosis not present

## 2016-03-04 DIAGNOSIS — E1122 Type 2 diabetes mellitus with diabetic chronic kidney disease: Secondary | ICD-10-CM | POA: Diagnosis present

## 2016-03-04 DIAGNOSIS — N1330 Unspecified hydronephrosis: Secondary | ICD-10-CM | POA: Diagnosis present

## 2016-03-04 DIAGNOSIS — Z8551 Personal history of malignant neoplasm of bladder: Secondary | ICD-10-CM

## 2016-03-04 DIAGNOSIS — Z515 Encounter for palliative care: Secondary | ICD-10-CM | POA: Diagnosis present

## 2016-03-04 DIAGNOSIS — E860 Dehydration: Secondary | ICD-10-CM | POA: Diagnosis present

## 2016-03-04 DIAGNOSIS — M1990 Unspecified osteoarthritis, unspecified site: Secondary | ICD-10-CM | POA: Diagnosis present

## 2016-03-04 DIAGNOSIS — R197 Diarrhea, unspecified: Secondary | ICD-10-CM | POA: Diagnosis present

## 2016-03-04 DIAGNOSIS — N170 Acute kidney failure with tubular necrosis: Secondary | ICD-10-CM | POA: Diagnosis present

## 2016-03-04 DIAGNOSIS — D649 Anemia, unspecified: Secondary | ICD-10-CM

## 2016-03-04 DIAGNOSIS — I129 Hypertensive chronic kidney disease with stage 1 through stage 4 chronic kidney disease, or unspecified chronic kidney disease: Secondary | ICD-10-CM | POA: Diagnosis present

## 2016-03-04 DIAGNOSIS — Z789 Other specified health status: Secondary | ICD-10-CM | POA: Diagnosis not present

## 2016-03-04 DIAGNOSIS — R531 Weakness: Secondary | ICD-10-CM | POA: Diagnosis not present

## 2016-03-04 DIAGNOSIS — Z7982 Long term (current) use of aspirin: Secondary | ICD-10-CM

## 2016-03-04 DIAGNOSIS — E782 Mixed hyperlipidemia: Secondary | ICD-10-CM | POA: Diagnosis present

## 2016-03-04 DIAGNOSIS — N2581 Secondary hyperparathyroidism of renal origin: Secondary | ICD-10-CM | POA: Diagnosis present

## 2016-03-04 DIAGNOSIS — C670 Malignant neoplasm of trigone of bladder: Secondary | ICD-10-CM | POA: Diagnosis not present

## 2016-03-04 DIAGNOSIS — I2510 Atherosclerotic heart disease of native coronary artery without angina pectoris: Secondary | ICD-10-CM | POA: Diagnosis present

## 2016-03-04 DIAGNOSIS — Z7189 Other specified counseling: Secondary | ICD-10-CM | POA: Diagnosis not present

## 2016-03-04 LAB — CBC WITH DIFFERENTIAL/PLATELET
BASOS ABS: 0.4 10*3/uL — AB (ref 0–0.1)
Basophils Relative: 1 %
EOS ABS: 0.2 10*3/uL (ref 0–0.7)
HCT: 18.8 % — ABNORMAL LOW (ref 35.0–47.0)
Hemoglobin: 5.9 g/dL — ABNORMAL LOW (ref 12.0–16.0)
Lymphocytes Relative: 7 %
Lymphs Abs: 1.9 10*3/uL (ref 1.0–3.6)
MCH: 25.5 pg — ABNORMAL LOW (ref 26.0–34.0)
MCHC: 31.1 g/dL — ABNORMAL LOW (ref 32.0–36.0)
MCV: 82.1 fL (ref 80.0–100.0)
MONO ABS: 0.6 10*3/uL (ref 0.2–0.9)
Monocytes Relative: 2 %
Neutro Abs: 25.7 10*3/uL — ABNORMAL HIGH (ref 1.4–6.5)
Neutrophils Relative %: 89 %
PLATELETS: 285 10*3/uL (ref 150–440)
RBC: 2.29 MIL/uL — AB (ref 3.80–5.20)
RDW: 16.6 % — AB (ref 11.5–14.5)
WBC: 28.7 10*3/uL — AB (ref 3.6–11.0)

## 2016-03-04 LAB — URINALYSIS COMPLETE WITH MICROSCOPIC (ARMC ONLY)
GLUCOSE, UA: NEGATIVE mg/dL
Nitrite: NEGATIVE
PH: 5.5 (ref 5.0–8.0)
SPECIFIC GRAVITY, URINE: 1.025 (ref 1.005–1.030)

## 2016-03-04 LAB — COMPREHENSIVE METABOLIC PANEL
ALBUMIN: 2.6 g/dL — AB (ref 3.5–5.0)
ALK PHOS: 88 U/L (ref 38–126)
ALT: 38 U/L (ref 14–54)
ANION GAP: 16 — AB (ref 5–15)
AST: 42 U/L — ABNORMAL HIGH (ref 15–41)
BUN: 89 mg/dL — ABNORMAL HIGH (ref 6–20)
CALCIUM: 8.3 mg/dL — AB (ref 8.9–10.3)
CHLORIDE: 106 mmol/L (ref 101–111)
CO2: 10 mmol/L — AB (ref 22–32)
Creatinine, Ser: 9.33 mg/dL — ABNORMAL HIGH (ref 0.44–1.00)
GFR calc Af Amer: 4 mL/min — ABNORMAL LOW (ref >60)
GFR calc non Af Amer: 4 mL/min — ABNORMAL LOW (ref >60)
GLUCOSE: 120 mg/dL — AB (ref 65–99)
Potassium: 4.4 mmol/L (ref 3.5–5.1)
SODIUM: 132 mmol/L — AB (ref 135–145)
Total Bilirubin: 0.8 mg/dL (ref 0.3–1.2)
Total Protein: 7.6 g/dL (ref 6.5–8.1)

## 2016-03-04 LAB — TROPONIN I: TROPONIN I: 0.1 ng/mL — AB (ref <0.03)

## 2016-03-04 LAB — GLUCOSE, CAPILLARY
Glucose-Capillary: 102 mg/dL — ABNORMAL HIGH (ref 65–99)
Glucose-Capillary: 104 mg/dL — ABNORMAL HIGH (ref 65–99)

## 2016-03-04 LAB — PREPARE RBC (CROSSMATCH)

## 2016-03-04 MED ORDER — FOLIC ACID 1 MG PO TABS
1.0000 mg | ORAL_TABLET | Freq: Every day | ORAL | Status: DC
  Administered 2016-03-04 – 2016-03-11 (×8): 1 mg via ORAL
  Filled 2016-03-04 (×8): qty 1

## 2016-03-04 MED ORDER — SODIUM CHLORIDE 0.9% FLUSH
3.0000 mL | Freq: Two times a day (BID) | INTRAVENOUS | Status: DC
  Administered 2016-03-04 – 2016-03-11 (×7): 3 mL via INTRAVENOUS

## 2016-03-04 MED ORDER — CEFTRIAXONE SODIUM 1 G IJ SOLR
1.0000 g | Freq: Every day | INTRAMUSCULAR | Status: DC
  Administered 2016-03-04: 1 g via INTRAVENOUS
  Filled 2016-03-04 (×2): qty 10

## 2016-03-04 MED ORDER — ACETAMINOPHEN 325 MG PO TABS
650.0000 mg | ORAL_TABLET | Freq: Four times a day (QID) | ORAL | Status: DC | PRN
  Administered 2016-03-04: 650 mg via ORAL
  Filled 2016-03-04: qty 2

## 2016-03-04 MED ORDER — SODIUM CHLORIDE 0.9 % IV BOLUS (SEPSIS)
500.0000 mL | Freq: Once | INTRAVENOUS | Status: AC
  Administered 2016-03-04: 500 mL via INTRAVENOUS

## 2016-03-04 MED ORDER — BISACODYL 10 MG RE SUPP
10.0000 mg | Freq: Every day | RECTAL | Status: DC | PRN
  Filled 2016-03-04: qty 1

## 2016-03-04 MED ORDER — FAMOTIDINE 20 MG PO TABS
20.0000 mg | ORAL_TABLET | Freq: Every day | ORAL | Status: DC
  Administered 2016-03-04 – 2016-03-11 (×8): 20 mg via ORAL
  Filled 2016-03-04 (×8): qty 1

## 2016-03-04 MED ORDER — ONDANSETRON HCL 4 MG PO TABS: 4.0000 mg | ORAL_TABLET | Freq: Four times a day (QID) | ORAL | Status: DC | PRN

## 2016-03-04 MED ORDER — SODIUM CHLORIDE 0.9 % IV BOLUS (SEPSIS)
1000.0000 mL | Freq: Once | INTRAVENOUS | Status: AC
  Administered 2016-03-04: 1000 mL via INTRAVENOUS

## 2016-03-04 MED ORDER — ONDANSETRON HCL 4 MG/2ML IJ SOLN: 4.0000 mg | Freq: Four times a day (QID) | INTRAMUSCULAR | Status: DC | PRN

## 2016-03-04 MED ORDER — SODIUM CHLORIDE 0.9 % IV SOLN
10.0000 mL/h | Freq: Once | INTRAVENOUS | Status: DC
  Administered 2016-03-04: 10 mL/h via INTRAVENOUS

## 2016-03-04 MED ORDER — LATANOPROST 0.005 % OP SOLN
1.0000 [drp] | Freq: Every day | OPHTHALMIC | Status: DC
  Administered 2016-03-04 – 2016-03-10 (×7): 1 [drp] via OPHTHALMIC
  Filled 2016-03-04: qty 2.5

## 2016-03-04 MED ORDER — ALBUTEROL SULFATE 108 (90 BASE) MCG/ACT IN AEPB: 2.0000 | INHALATION_SPRAY | Freq: Four times a day (QID) | RESPIRATORY_TRACT | Status: DC | PRN

## 2016-03-04 MED ORDER — SODIUM CHLORIDE 0.9 % IV SOLN
INTRAVENOUS | Status: AC
  Administered 2016-03-04 – 2016-03-05 (×2): via INTRAVENOUS

## 2016-03-04 MED ORDER — INSULIN ASPART 100 UNIT/ML ~~LOC~~ SOLN
0.0000 [IU] | SUBCUTANEOUS | Status: DC
  Administered 2016-03-06 – 2016-03-10 (×10): 1 [IU] via SUBCUTANEOUS
  Filled 2016-03-04 (×10): qty 1

## 2016-03-04 MED ORDER — FERROUS SULFATE 325 (65 FE) MG PO TABS
325.0000 mg | ORAL_TABLET | Freq: Two times a day (BID) | ORAL | Status: DC
  Administered 2016-03-04 – 2016-03-11 (×15): 325 mg via ORAL
  Filled 2016-03-04 (×15): qty 1

## 2016-03-04 MED ORDER — POLYETHYLENE GLYCOL 3350 17 G PO PACK: 17.0000 g | PACK | Freq: Every day | ORAL | Status: DC | PRN

## 2016-03-04 MED ORDER — ACETAMINOPHEN 650 MG RE SUPP: 650.0000 mg | Freq: Four times a day (QID) | RECTAL | Status: DC | PRN

## 2016-03-04 MED ORDER — ALBUTEROL SULFATE (2.5 MG/3ML) 0.083% IN NEBU: 2.5000 mg | INHALATION_SOLUTION | RESPIRATORY_TRACT | Status: DC | PRN

## 2016-03-04 NOTE — ED Notes (Signed)
Pt transported to XR.  

## 2016-03-04 NOTE — Progress Notes (Signed)
OKay per Dr. Darvin Neighbours to discontue blood culture order as only 1 was able to be drawn

## 2016-03-04 NOTE — ED Notes (Signed)
Attempt for Iv access X 1 by this RN unsuccessful. Katie, RN at pt bedside to attempt for IV ultrasound.

## 2016-03-04 NOTE — ED Triage Notes (Signed)
Pt brought to Ed for low blood pressure from pain clinic.  Per staff at clinic pt bp was 84/52 with hr of 104.

## 2016-03-04 NOTE — H&P (Signed)
[facility] NAME: Barbara [name]    MR#:  [id number]  DATE OF BIRTH:  Oct 29, 1940  DATE OF ADMISSION:  03/04/2016  PRIMARY CARE PHYSICIAN: Madelyn Brunner, MD   REQUESTING/REFERRING PHYSICIAN: Dr. Edd Fabian  CHIEF COMPLAINT:   Chief Complaint  Patient presents with  . Hypotension    HISTORY OF PRESENT ILLNESS:  Barbara [name]  is a 75 y.o. female with a known history of Hypertension, CKD stage III, anemia of chronic disease, bladder cancer with recent left hydronephrosis status post cystoscopy and bladder resection waiting for her radiation treatment presents to the emergency room with worsening weakness. She has had chronic low back pain and was at the pain clinic to get epidural injection patient was found to be hypotensive with systolic blood pressure in the 90s and sent to the emergency room. Here patient has been found to have white count of 28,000 with hemoglobin of 5.6 down from her baseline of 8. BUN of 89 creatinine 9.93. No bloody urine on in and out catheterization. Stool negative for blood.  Patient on 02/17/2016 had 1. TURBT, 5 cm 2. Bilateral retrograde pyelogram 3. Left ureteral stent placement 4. Left nephrostomy tube removal    PAST MEDICAL HISTORY:   Past Medical History:  Diagnosis Date  . Anemia associated with chronic renal failure   . Arthritis   . CKD (chronic kidney disease)   . COPD (chronic obstructive pulmonary disease) ([place])   . Diabetes mellitus without complication ([place])   . Hypertension   . Sleep apnea    uses cpap  . Urinary obstruction 01/2016    PAST SURGICAL HISTORY:   Past Surgical History:  Procedure Laterality Date  . ABDOMINAL HYSTERECTOMY    . CYSTOSCOPY W/ RETROGRADES Bilateral 02/17/2016   Procedure: CYSTOSCOPY WITH RETROGRADE PYELOGRAM;  Surgeon: Hollice Espy, MD;  Location: [facility];  Service: Urology;  Laterality: Bilateral;  . CYSTOSCOPY WITH STENT PLACEMENT Left  01/21/2016   Procedure: CYSTOSCOPY WITH double J STENT PLACEMENT;  Surgeon: Franchot Gallo, MD;  Location: [facility];  Service: Urology;  Laterality: Left;  . KIDNEY SURGERY  01/21/2016   IR NEPHROSTOMY PLACEMENT LEFT   . ROTATOR CUFF REPAIR     left  . TRANSURETHRAL RESECTION OF BLADDER TUMOR N/A 02/17/2016   Procedure: TRANSURETHRAL RESECTION OF BLADDER TUMOR (TURBT)-LARGE;  Surgeon: Hollice Espy, MD;  Location: [facility];  Service: Urology;  Laterality: N/A;  . URETEROSCOPY Left 02/17/2016   Procedure: URETEROSCOPY;  Surgeon: Hollice Espy, MD;  Location: [facility];  Service: Urology;  Laterality: Left;    SOCIAL HISTORY:   Social History  Substance Use Topics  . Smoking status: Former Smoker    Quit date: 07/20/1993  . Smokeless tobacco: Never Used     Comment: 01/22/2016   "  quit smoking many years ago "  . Alcohol use No    FAMILY HISTORY:   Family History  Problem Relation Age of Onset  . Diabetes Mother   . Cancer Father   . Breast cancer Neg Hx   . Kidney disease Neg Hx     DRUG ALLERGIES:   Allergies  Allergen Reactions  . Percocet [Oxycodone-Acetaminophen] Hives    REVIEW OF SYSTEMS:   Review of Systems  Constitutional: Positive for chills and malaise/fatigue. Negative for fever and weight loss.  HENT: Negative for hearing loss and nosebleeds.   Eyes: Negative for blurred vision, double vision and pain.  Respiratory: Negative for cough,  hemoptysis, sputum production, shortness of breath and wheezing.   Cardiovascular: Negative for chest pain, palpitations, orthopnea and leg swelling.  Gastrointestinal: Negative for abdominal pain, constipation, diarrhea, nausea and vomiting.  Genitourinary: Negative for dysuria and hematuria.  Musculoskeletal: Positive for back pain. Negative for falls and myalgias.  Skin: Negative for rash.  Neurological: Positive for dizziness and weakness. Negative for tremors, sensory change, speech change, focal weakness, seizures and  headaches.  Endo/Heme/Allergies: Does not bruise/bleed easily.  Psychiatric/Behavioral: Negative for depression and memory loss. The patient is not nervous/anxious.     MEDICATIONS AT HOME:   Prior to Admission medications   Medication Sig Start Date End Date Taking? Authorizing Provider  Albuterol Sulfate (PROAIR RESPICLICK) 130 (90 Base) MCG/ACT AEPB Inhale 2 puffs into the lungs 4 (four) times daily as needed (wheezing).     Historical Provider, MD  amLODipine (NORVASC) 5 MG tablet Take 1 tablet (5 mg total) by mouth daily. 01/27/16   Lavina Hamman, MD  aspirin 81 MG tablet Take 81 mg by mouth 2 (two) times daily.    Historical Provider, MD  atorvastatin (LIPITOR) 40 MG tablet Take 40 mg by mouth daily.    Historical Provider, MD  barrier cream (NON-SPECIFIED) CREA Apply 1 application topically 3 (three) times daily as needed (every shift).    Historical Provider, MD  Cholecalciferol (VITAMIN D-3) 1000 units CAPS Take 1 capsule by mouth every morning.    Historical Provider, MD  famotidine (PEPCID) 20 MG tablet Take 1 tablet (20 mg total) by mouth daily. 01/27/16   Lavina Hamman, MD  ferrous sulfate 325 (65 FE) MG tablet Take 1 tablet (325 mg total) by mouth 2 (two) times daily with a meal. 01/27/16   Lavina Hamman, MD  fluconazole (DIFLUCAN) 100 MG tablet Take 1 tablet daily starting 5 days prior to surgery. Patient not taking: Reported on 02/25/2016 02/11/16   Hollice Espy, MD  folic acid (FOLVITE) 1 MG tablet Take 1 tablet (1 mg total) by mouth daily. 01/27/16   Lavina Hamman, MD  gabapentin (NEURONTIN) 100 MG capsule Take 100 mg by mouth 2 (two) times daily.    Historical Provider, MD  HYDROcodone-acetaminophen (NORCO/VICODIN) 5-325 MG tablet Take 1-2 tablets by mouth every 6 (six) hours as needed for moderate pain. Patient not taking: Reported on 02/25/2016 02/17/16   Hollice Espy, MD  insulin aspart (NOVOLOG) 100 UNIT/ML injection Inject 0-15 Units into the skin 3 (three) times daily  with meals. Patient taking differently: Inject 0-15 Units into the skin 3 (three) times daily with meals. If BS <60 call MD, if BS is 70-120=0u, 121-150=2u, 151-200=3, 201-250=5u, 251-300=8u, 301-350=11u, 351-400=15u, if >450 call MD 01/27/16   Lavina Hamman, MD  insulin aspart (NOVOLOG) 100 UNIT/ML injection Inject 0-5 Units into the skin at bedtime. If BS <60 call MD, if BS is 70-200=0u, 201-250=2u, 251-300=3u, 301-350=4u, 351-400=5u, if >450 call MD    Historical Provider, MD  Insulin Glargine (LANTUS SOLOSTAR) 100 UNIT/ML Solostar Pen INJECT 38 UNITS SUBQ EVERY MORNING AND 36 UNITS EVERY EVENING 03/02/16   Historical Provider, MD  NON FORMULARY Place 1 Units into the nose at bedtime. CPAP Time of use 2100-0600    Historical Provider, MD  Travoprost, BAK Free, (TRAVATAN) 0.004 % SOLN ophthalmic solution Place 1 drop into both eyes at bedtime.    Historical Provider, MD  vitamin B-12 1000 MCG tablet Take 1 tablet (1,000 mcg total) by mouth daily. Patient not taking: Reported on 02/07/2016 01/27/16  Lavina Hamman, MD     VITAL SIGNS:  Blood pressure (!) 113/47, pulse 95, temperature 97.5 F (36.4 C), temperature source Oral, resp. rate 20, height 5' 8"  (1.727 m), weight 81.6 kg (180 lb), SpO2 94 %.  PHYSICAL EXAMINATION:  Physical Exam  GENERAL:  75 y.o.-year-old patient lying in the bed with no acute distress.  EYES: Pupils equal, round, reactive to light and accommodation. No scleral icterus. Extraocular muscles intact.  HEENT: Head atraumatic, normocephalic. Oropharynx and nasopharynx clear. No oropharyngeal erythema, moist oral mucosa Pallor positive NECK:  Supple, no jugular venous distention. No thyroid enlargement, no tenderness.  LUNGS: Normal breath sounds bilaterally, no wheezing, rales, rhonchi. No use of accessory muscles of respiration.  CARDIOVASCULAR: S1, S2 normal. No murmurs, rubs, or gallops.  ABDOMEN: Soft, nontender, nondistended. Bowel sounds present. No organomegaly or  mass.  EXTREMITIES: No pedal edema, cyanosis, or clubbing. + 2 pedal & radial pulses b/l.   NEUROLOGIC: Cranial nerves II through XII are intact. No focal Motor or sensory deficits appreciated b/l PSYCHIATRIC: The patient is alert and oriented x 3. Good affect.  SKIN: No obvious rash, lesion, or ulcer.   LABORATORY PANEL:   CBC  Recent Labs Lab 03/04/16 1147  WBC 28.7*  HGB 5.9*  HCT 18.8*  PLT 285   ------------------------------------------------------------------------------------------------------------------  Chemistries   Recent Labs Lab 03/04/16 1046  NA 132*  K 4.4  CL 106  CO2 10*  GLUCOSE 120*  BUN 89*  CREATININE 9.33*  CALCIUM 8.3*  AST 42*  ALT 38  ALKPHOS 88  BILITOT 0.8   ------------------------------------------------------------------------------------------------------------------  Cardiac Enzymes  Recent Labs Lab 03/04/16 1147  TROPONINI 0.10*   ------------------------------------------------------------------------------------------------------------------  RADIOLOGY:  Dg Chest 2 View  Result Date: 03/04/2016 CLINICAL DATA:  Low blood pressure, weakness, question pneumonia, history COPD, hypertension, diabetes mellitus, former smoker EXAM: CHEST  2 VIEW COMPARISON:  12/17/2015 FINDINGS: Enlargement of cardiac silhouette with slight vascular congestion. Mediastinal contours vascularity normal. Atherosclerotic calcification aorta. Lungs clear. Normal pulmonary infiltrate, pleural effusion, or pneumothorax. Bones unremarkable. IMPRESSION: Enlargement of cardiac silhouette with pulmonary vascular congestion. No acute infiltrate. Aortic atherosclerosis. Electronically Signed   By: Lavonia Dana M.D.   On: 03/04/2016 12:31     IMPRESSION AND PLAN:   * Acute renal failure over CKD stage III Likely due to ATN from hypotension and dehydration. Will need to rule out obstruction due to history of bladder cancer. Check renal ultrasound. Input and  output. No uremic symptoms or hyperkalemia or metabolic acidosis at this point. Discussed with Dr. Holley Raring of nephrology. Hemodialysis if no improvement.  * Sepsis due to UTI Start ceftriaxone. Also check urine culture and blood cultures. Fluid bolus due to hypotension and renal failure.  * Acute anemia over anemia of chronic disease Stool was negative for blood checked in the emergency room. No hematuria. Could be anemia of chronic disease or postop from recent bladder surgery. Transfuse 2 units packed RBC.  * Hypertension Hold Norvasc  * Bladder cancer Check renal and bladder ultrasound Follows up as outpatient with urology and radiation oncology. Started radiation or chemotherapy yet.  * Elevated troponin likely due to dementia and anemia. We'll repeat troponin. No chest pain or shortness of breath. EKG shows no acute changes per  * DVT prophylaxis with SCDs. No heparin or Lovenox in case patient needs procedures for any hydronephrosis.  All the records are reviewed and case discussed with ED provider. Management plans discussed with the patient, family and they are in  agreement.  CODE STATUS: FULL CODE  TOTAL CC TIME TAKING CARE OF THIS PATIENT: 40 minutes.   Hillary Bow R M.D on 03/04/2016 at 2:17 PM  Between 7am to 6pm - Pager - [phone]  After 6pm go to [email or web address] - password [email or web address] [facility]  Office  [phone]  CC: Primary care physician; Madelyn Brunner, MD  Note: This dictation was prepared with Dragon dictation along with smaller phrase technology. Any transcriptional errors that result from this process are unintentional.

## 2016-03-04 NOTE — ED Notes (Signed)
MD Edd Fabian informed of failed [place] IV attempts.

## 2016-03-04 NOTE — ED Provider Notes (Signed)
[facility] Emergency Department Provider Note   ____________________________________________   First MD Initiated Contact with Patient 03/04/16 1009     (approximate)  I have reviewed the triage vital signs and the nursing notes.   HISTORY  Chief Complaint Hypotension    HPI Barbara [name] is a 75 y.o. female with history of bladder cancer, not yet on radiation, lumbar radiculitis, hypertension, anemia, coronary artery disease, GERD and asthma who presents for evaluation of weakness and hypotension today, gradual onset, constant, severe, no modifying factors. Patient says she has been feeling generally weak over the past few days. She went to her chronic pain appointments, she was was to have injections for lumbar radiculitis and they found that she was hypotensive and sent her to the emergency department. She denies any recent illness including no cough, sneezing, runny nose, vomiting, fevers or chills but she has chronic diarrhea. She denies any chest pain or difficulty breathing. She denies any dysuria.   Past Medical History:  Diagnosis Date  . Anemia associated with chronic renal failure   . Arthritis   . CKD (chronic kidney disease)   . COPD (chronic obstructive pulmonary disease) ([place])   . Diabetes mellitus without complication ([place])   . Hypertension   . Sleep apnea    uses cpap  . Urinary obstruction 01/2016    Patient Active Problem List   Diagnosis Date Noted  . ARF (acute renal failure) ([place]) 03/04/2016  . Cancer of trigone of urinary bladder ([place]) 03/02/2016  . Absolute anemia 02/04/2016  . Airway hyperreactivity 02/04/2016  . Celiac disease 02/04/2016  . Gastric catarrh 02/04/2016  . Acid reflux 02/04/2016  . Combined fat and carbohydrate induced hyperlipemia 02/04/2016  . C. difficile colitis 01/22/2016  . Urinary obstruction 01/21/2016  . COPD (chronic obstructive pulmonary disease) ([place]) 01/21/2016  . Controlled type 2 diabetes  mellitus with stage 4 chronic kidney disease, with long-term current use of insulin ([place]) 01/21/2016  . Essential hypertension 01/21/2016  . Acute renal failure superimposed on stage 4 chronic kidney disease ([place]) 01/20/2016  . Anemia in chronic kidney disease 01/20/2016  . Sepsis ([place]) 12/18/2015  . UTI (lower urinary tract infection) 12/18/2015  . Arthritis of knee, degenerative 05/10/2015  . Knee strain 03/26/2015  . Other intervertebral disc displacement, lumbar region 03/04/2015  . Degenerative arthritis of lumbar spine 03/04/2015  . Injury of tendon of upper extremity 11/12/2014  . Atherosclerosis of abdominal aorta ([place]) 11/02/2014  . Chronic kidney disease, stage IV (severe) ([place]) 11/02/2014  . Obstructive apnea 11/02/2014  . Complete rotator cuff rupture of left shoulder 10/05/2014  . Arthritis of shoulder region, degenerative 10/05/2014  . CAD in native artery 12/08/2013  . Benign essential HTN 12/08/2013  . Type 2 diabetes mellitus ([place]) 12/08/2013    Past Surgical History:  Procedure Laterality Date  . ABDOMINAL HYSTERECTOMY    . CYSTOSCOPY W/ RETROGRADES Bilateral 02/17/2016   Procedure: CYSTOSCOPY WITH RETROGRADE PYELOGRAM;  Surgeon: Hollice Espy, MD;  Location: [facility] ORS;  Service: Urology;  Laterality: Bilateral;  . CYSTOSCOPY WITH STENT PLACEMENT Left 01/21/2016   Procedure: CYSTOSCOPY WITH double J STENT PLACEMENT;  Surgeon: Franchot Gallo, MD;  Location: [facility] ORS;  Service: Urology;  Laterality: Left;  . KIDNEY SURGERY  01/21/2016   IR NEPHROSTOMY PLACEMENT LEFT   . ROTATOR CUFF REPAIR     left  . TRANSURETHRAL RESECTION OF BLADDER TUMOR N/A 02/17/2016   Procedure: TRANSURETHRAL RESECTION OF BLADDER TUMOR (TURBT)-LARGE;  Surgeon: Hollice Espy, MD;  Location: [facility] ORS;  Service: Urology;  Laterality: N/A;  . URETEROSCOPY Left 02/17/2016   Procedure: URETEROSCOPY;  Surgeon: Hollice Espy, MD;  Location: [facility] ORS;  Service: Urology;  Laterality: Left;    Prior  to Admission medications   Medication Sig Start Date End Date Taking? Authorizing Provider  Albuterol Sulfate (PROAIR RESPICLICK) 433 (90 Base) MCG/ACT AEPB Inhale 2 puffs into the lungs 4 (four) times daily as needed (wheezing).     Historical Provider, MD  amLODipine (NORVASC) 5 MG tablet Take 1 tablet (5 mg total) by mouth daily. 01/27/16   Lavina Hamman, MD  aspirin 81 MG tablet Take 81 mg by mouth 2 (two) times daily.    Historical Provider, MD  atorvastatin (LIPITOR) 40 MG tablet Take 40 mg by mouth daily.    Historical Provider, MD  barrier cream (NON-SPECIFIED) CREA Apply 1 application topically 3 (three) times daily as needed (every shift).    Historical Provider, MD  Cholecalciferol (VITAMIN D-3) 1000 units CAPS Take 1 capsule by mouth every morning.    Historical Provider, MD  famotidine (PEPCID) 20 MG tablet Take 1 tablet (20 mg total) by mouth daily. 01/27/16   Lavina Hamman, MD  ferrous sulfate 325 (65 FE) MG tablet Take 1 tablet (325 mg total) by mouth 2 (two) times daily with a meal. 01/27/16   Lavina Hamman, MD  fluconazole (DIFLUCAN) 100 MG tablet Take 1 tablet daily starting 5 days prior to surgery. Patient not taking: Reported on 02/25/2016 02/11/16   Hollice Espy, MD  folic acid (FOLVITE) 1 MG tablet Take 1 tablet (1 mg total) by mouth daily. 01/27/16   Lavina Hamman, MD  gabapentin (NEURONTIN) 100 MG capsule Take 100 mg by mouth 2 (two) times daily.    Historical Provider, MD  HYDROcodone-acetaminophen (NORCO/VICODIN) 5-325 MG tablet Take 1-2 tablets by mouth every 6 (six) hours as needed for moderate pain. Patient not taking: Reported on 02/25/2016 02/17/16   Hollice Espy, MD  insulin aspart (NOVOLOG) 100 UNIT/ML injection Inject 0-15 Units into the skin 3 (three) times daily with meals. Patient taking differently: Inject 0-15 Units into the skin 3 (three) times daily with meals. If BS <60 call MD, if BS is 70-120=0u, 121-150=2u, 151-200=3, 201-250=5u, 251-300=8u, 301-350=11u,  351-400=15u, if >450 call MD 01/27/16   Lavina Hamman, MD  insulin aspart (NOVOLOG) 100 UNIT/ML injection Inject 0-5 Units into the skin at bedtime. If BS <60 call MD, if BS is 70-200=0u, 201-250=2u, 251-300=3u, 301-350=4u, 351-400=5u, if >450 call MD    Historical Provider, MD  Insulin Glargine (LANTUS SOLOSTAR) 100 UNIT/ML Solostar Pen INJECT 38 UNITS SUBQ EVERY MORNING AND 36 UNITS EVERY EVENING 03/02/16   Historical Provider, MD  NON FORMULARY Place 1 Units into the nose at bedtime. CPAP Time of use 2100-0600    Historical Provider, MD  Travoprost, BAK Free, (TRAVATAN) 0.004 % SOLN ophthalmic solution Place 1 drop into both eyes at bedtime.    Historical Provider, MD  vitamin B-12 1000 MCG tablet Take 1 tablet (1,000 mcg total) by mouth daily. Patient not taking: Reported on 02/07/2016 01/27/16   Lavina Hamman, MD    Allergies Percocet [oxycodone-acetaminophen]  Family History  Problem Relation Age of Onset  . Diabetes Mother   . Cancer Father   . Breast cancer Neg Hx   . Kidney disease Neg Hx     Social History Social History  Substance Use Topics  . Smoking status: Former Audiological scientist  date: 07/20/1993  . Smokeless tobacco: Never Used     Comment: 01/22/2016   "  quit smoking many years ago "  . Alcohol use No    Review of Systems Constitutional: No fever/chills Eyes: No visual changes. ENT: No sore throat. Cardiovascular: Denies chest pain. Respiratory: Denies shortness of breath. Gastrointestinal: No abdominal pain.  No nausea, no vomiting.  No diarrhea.  No constipation. Genitourinary: Negative for dysuria. Musculoskeletal: Negative for back pain. Skin: Negative for rash. Neurological: Negative for headaches, focal weakness or numbness.  10-point ROS otherwise negative.  ____________________________________________   PHYSICAL EXAM:  VITAL SIGNS: ED Triage Vitals  Enc Vitals Group     BP 03/04/16 1008 (!) 103/41     Pulse Rate 03/04/16 1008 96     Resp  03/04/16 1008 18     Temp 03/04/16 1008 97.5 F (36.4 C)     Temp Source 03/04/16 1008 Oral     SpO2 03/04/16 1008 95 %     Weight 03/04/16 1000 180 lb (81.6 kg)     Height 03/04/16 1000 5' 8"  (1.727 m)     Head Circumference --      Peak Flow --      Pain Score 03/04/16 1001 10     Pain Loc --      Pain Edu? --      Excl. in [place]? --     Constitutional: Alert and oriented. Nontoxic appearing and in no acute distress. Eyes: Conjunctivae are normal. PERRL. EOMI. Head: Atraumatic. Nose: No congestion/rhinnorhea. Mouth/Throat: Mucous membranes are moist.  Oropharynx non-erythematous. Neck: No stridor.   Cardiovascular: Normal rate, regular rhythm. Grossly normal heart sounds.  Good peripheral circulation. Respiratory: Normal respiratory effort.  No retractions. Lungs CTAB. Gastrointestinal: Soft and nontender. No distention. No CVA tenderness. Genitourinary: deferred Rectal: brown stool in rectal vault is guaiac negative Musculoskeletal: No lower extremity tenderness nor edema.  No joint effusions. Neurologic:  Normal speech and language. No gross focal neurologic deficits are appreciated.  Skin:  Skin is warm, dry and intact. No rash noted. Psychiatric: Mood and affect are normal. Speech and behavior are normal.  ____________________________________________   LABS (all labs ordered are listed, but only abnormal results are displayed)  Labs Reviewed  COMPREHENSIVE METABOLIC PANEL - Abnormal; Notable for the following:       Result Value   Sodium 132 (*)    CO2 10 (*)    Glucose, Bld 120 (*)    BUN 89 (*)    Creatinine, Ser 9.33 (*)    Calcium 8.3 (*)    Albumin 2.6 (*)    AST 42 (*)    GFR calc non Af Amer 4 (*)    GFR calc Af Amer 4 (*)    Anion gap 16 (*)    All other components within normal limits  URINALYSIS COMPLETEWITH MICROSCOPIC ([facility] ONLY) - Abnormal; Notable for the following:    Color, Urine YELLOW (*)    APPearance CLOUDY (*)    Bilirubin Urine 1+ (*)      Ketones, ur TRACE (*)    Hgb urine dipstick 3+ (*)    Protein, ur >300 (*)    Leukocytes, UA 3+ (*)    Bacteria, UA MANY (*)    Squamous Epithelial / LPF 0-5 (*)    All other components within normal limits  TROPONIN I - Abnormal; Notable for the following:    Troponin I 0.10 (*)    All other components within normal  limits  CBC WITH DIFFERENTIAL/PLATELET - Abnormal; Notable for the following:    WBC 28.7 (*)    RBC 2.29 (*)    Hemoglobin 5.9 (*)    HCT 18.8 (*)    MCH 25.5 (*)    MCHC 31.1 (*)    RDW 16.6 (*)    Neutro Abs 25.7 (*)    Basophils Absolute 0.4 (*)    All other components within normal limits  URINE CULTURE  CULTURE, BLOOD (ROUTINE X 2)  CULTURE, BLOOD (ROUTINE X 2)  CBC WITH DIFFERENTIAL/PLATELET  TYPE AND SCREEN  PREPARE RBC (CROSSMATCH)   ____________________________________________  EKG  ED ECG REPORT I, Joanne Gavel, the attending physician, personally viewed and interpreted this ECG.   Date: 03/04/2016  EKG Time: 10:21  Rate: 95  Rhythm: normal sinus rhythm with multiple PACs and PVCs.  Axis: normal  Intervals:none  ST&T Change: No acute ST elevation or acute ST depression.  ____________________________________________  RADIOLOGY  CXR Enlargement of cardiac silhouette with pulmonary vascular congestion.  No acute infiltrate.  Aortic atherosclerosis. ____________________________________________   PROCEDURES  Procedure(s) performed: None  Procedures  Critical Care performed: Yes, see critical care note(s)   CRITICAL CARE Performed by: Loura Pardon A   Total critical care time: 40 minutes  Critical care time was exclusive of separately billable procedures and treating other patients.  Critical care was necessary to treat or prevent imminent or life-threatening deterioration.  Critical care was time spent personally by me on the following activities: development of treatment plan with patient and/or surrogate as well as  nursing, discussions with consultants, evaluation of patient's response to treatment, examination of patient, obtaining history from patient or surrogate, ordering and performing treatments and interventions, ordering and review of laboratory studies, ordering and review of radiographic studies, pulse oximetry and re-evaluation of patient's condition.  ____________________________________________   INITIAL IMPRESSION / ASSESSMENT AND PLAN / ED COURSE  Pertinent labs & imaging results that were available during my care of the patient were reviewed by me and considered in my medical decision making (see chart for details).  Barbara [name] is a 75 y.o. female with history of bladder cancer, not yet on radiation, lumbar radiculitis, hypertension, anemia, coronary artery disease, GERD and asthma who presents for evaluation of weakness and hypotension today. On exam, she is nontoxic appearing and in no acute distress. She was hypotensive on arrival with blood pressure 97/45 however this has improved to 114/40 at this time after IV fluids. CBC is notable for severe anemia, hemoglobin 5.9, guaiac negative stool from below. She has agreed to receive blood, we discussed risk and benefits and she wants to proceed. We'll transfuse 2 units. Creatinine is elevated at 9, continue aggressive IV fluids, will discuss with hospitalist admission.  Clinical Course     ____________________________________________   FINAL CLINICAL IMPRESSION(S) / ED DIAGNOSES  Final diagnoses:  Hypotension, unspecified hypotension type  Weakness  Anemia, unspecified anemia type  Acute renal failure, unspecified acute renal failure type ([place])      NEW MEDICATIONS STARTED DURING THIS VISIT:  New Prescriptions   No medications on file     Note:  This document was prepared using Dragon voice recognition software and may include unintentional dictation errors.    Joanne Gavel, MD 03/04/16 1416

## 2016-03-04 NOTE — ED Notes (Signed)
Lab value relayed to MD Edd Fabian

## 2016-03-04 NOTE — ED Notes (Signed)
Attempt for IV access via ultrasound X 2 by Joellen Jersey, RN unsuccessful. Report to [place], Therapist, sports. Care assumed by Tawanna Sat, RN

## 2016-03-04 NOTE — ED Notes (Addendum)
Lying HR 87 BP 99/42.  Sitting HR 98 BP 90/46  Pt unable to stand

## 2016-03-04 NOTE — ED Notes (Signed)
Admitting MD at bedside.

## 2016-03-04 NOTE — ED Notes (Signed)
Pt reports generalized weakness X "a few" days. Denies any SOB, CP, abdominal pain or urinary sx. Pt alert and oriented X4, active, cooperative, pt in NAD. RR even and unlabored, color WNL.

## 2016-03-04 NOTE — ED Notes (Signed)
Patient transported to Ultrasound 

## 2016-03-04 NOTE — ED Notes (Signed)
MD at bedside. 

## 2016-03-05 ENCOUNTER — Other Ambulatory Visit: Payer: Self-pay | Admitting: *Deleted

## 2016-03-05 DIAGNOSIS — C670 Malignant neoplasm of trigone of bladder: Secondary | ICD-10-CM

## 2016-03-05 LAB — GASTROINTESTINAL PANEL BY PCR, STOOL (REPLACES STOOL CULTURE)
ADENOVIRUS F40/41: NOT DETECTED
Astrovirus: NOT DETECTED
CAMPYLOBACTER SPECIES: NOT DETECTED
CRYPTOSPORIDIUM: NOT DETECTED
CYCLOSPORA CAYETANENSIS: NOT DETECTED
E. COLI O157: NOT DETECTED
ENTEROPATHOGENIC E COLI (EPEC): NOT DETECTED
Entamoeba histolytica: NOT DETECTED
Enteroaggregative E coli (EAEC): NOT DETECTED
Enterotoxigenic E coli (ETEC): NOT DETECTED
Giardia lamblia: NOT DETECTED
Norovirus GI/GII: NOT DETECTED
PLESIMONAS SHIGELLOIDES: NOT DETECTED
ROTAVIRUS A: NOT DETECTED
SALMONELLA SPECIES: NOT DETECTED
SAPOVIRUS (I, II, IV, AND V): NOT DETECTED
SHIGA LIKE TOXIN PRODUCING E COLI (STEC): NOT DETECTED
SHIGELLA/ENTEROINVASIVE E COLI (EIEC): NOT DETECTED
VIBRIO SPECIES: NOT DETECTED
Vibrio cholerae: NOT DETECTED
YERSINIA ENTEROCOLITICA: NOT DETECTED

## 2016-03-05 LAB — C DIFFICILE QUICK SCREEN W PCR REFLEX
C DIFFICLE (CDIFF) ANTIGEN: POSITIVE — AB
C Diff interpretation: DETECTED
C Diff toxin: POSITIVE — AB

## 2016-03-05 LAB — CBC
HEMATOCRIT: 26.9 % — AB (ref 35.0–47.0)
HEMOGLOBIN: 8.5 g/dL — AB (ref 12.0–16.0)
MCH: 26.8 pg (ref 26.0–34.0)
MCHC: 31.7 g/dL — ABNORMAL LOW (ref 32.0–36.0)
MCV: 84.6 fL (ref 80.0–100.0)
Platelets: 347 10*3/uL (ref 150–440)
RBC: 3.18 MIL/uL — AB (ref 3.80–5.20)
RDW: 15.7 % — AB (ref 11.5–14.5)
WBC: 30.5 10*3/uL — ABNORMAL HIGH (ref 3.6–11.0)

## 2016-03-05 LAB — GLUCOSE, CAPILLARY
GLUCOSE-CAPILLARY: 100 mg/dL — AB (ref 65–99)
GLUCOSE-CAPILLARY: 94 mg/dL (ref 65–99)
GLUCOSE-CAPILLARY: 95 mg/dL (ref 65–99)
GLUCOSE-CAPILLARY: 94 mg/dL (ref 65–99)
Glucose-Capillary: 93 mg/dL (ref 65–99)
Glucose-Capillary: 94 mg/dL (ref 65–99)

## 2016-03-05 LAB — BASIC METABOLIC PANEL
Anion gap: 15 (ref 5–15)
BUN: 84 mg/dL — ABNORMAL HIGH (ref 6–20)
CALCIUM: 8.1 mg/dL — AB (ref 8.9–10.3)
CO2: 11 mmol/L — AB (ref 22–32)
CREATININE: 7.27 mg/dL — AB (ref 0.44–1.00)
Chloride: 110 mmol/L (ref 101–111)
GFR calc non Af Amer: 5 mL/min — ABNORMAL LOW (ref >60)
GFR, EST AFRICAN AMERICAN: 6 mL/min — AB (ref >60)
GLUCOSE: 93 mg/dL (ref 65–99)
Potassium: 3.8 mmol/L (ref 3.5–5.1)
Sodium: 136 mmol/L (ref 135–145)

## 2016-03-05 LAB — ABO/RH: ABO/RH(D): O POS

## 2016-03-05 LAB — HEMOGLOBIN: HEMOGLOBIN: 7.6 g/dL — AB (ref 12.0–16.0)

## 2016-03-05 MED ORDER — ACETAMINOPHEN 325 MG PO TABS
650.0000 mg | ORAL_TABLET | Freq: Once | ORAL | Status: AC
  Administered 2016-03-05: 650 mg via ORAL
  Filled 2016-03-05: qty 2

## 2016-03-05 MED ORDER — ACETAMINOPHEN 325 MG PO TABS: 650.0000 mg | ORAL_TABLET | Freq: Once | ORAL | Status: DC

## 2016-03-05 MED ORDER — SODIUM CHLORIDE 0.9 % IV SOLN
INTRAVENOUS | Status: DC
  Administered 2016-03-05 – 2016-03-06 (×2): via INTRAVENOUS

## 2016-03-05 MED ORDER — ACETAMINOPHEN 325 MG PO TABS
650.0000 mg | ORAL_TABLET | Freq: Four times a day (QID) | ORAL | Status: DC | PRN
  Administered 2016-03-05: 650 mg via ORAL
  Filled 2016-03-05: qty 2

## 2016-03-05 MED ORDER — DEXTROSE 5 % IV SOLN
1.0000 g | Freq: Every day | INTRAVENOUS | Status: AC
  Administered 2016-03-05 – 2016-03-06 (×2): 1 g via INTRAVENOUS
  Filled 2016-03-05 (×2): qty 10

## 2016-03-05 MED ORDER — ACETAMINOPHEN 650 MG RE SUPP: 650.0000 mg | Freq: Four times a day (QID) | RECTAL | Status: DC | PRN

## 2016-03-05 NOTE — Progress Notes (Signed)
Palliative Medicine Consult Order Noted. Due to high referral volume and limited staffing, there will be a delay seeing this patient. Palliative Medicine Provider will return to [facility] on 03/09/16, and patient will be evaluated then. Please call the Palliative Medicine Team office at [phone] if recommendations are needed in the interim.  Thank you for inviting [place] to see this patient.  Marjie Skiff Imanie Darrow, RN, BSN, [facility] 03/05/2016 10:38 AM Cell [phone] 8:00-4:00 Monday-Friday Office [phone]

## 2016-03-05 NOTE — Progress Notes (Signed)
[facility]  ROUNDING NOTE   Subjective:  Patient well-known to [place] from the office. Dr. Candiss Norse follows her regularly for chronic kidney disease stage IV. She was last seen in the office on 12/11/15. She has underlying chronic kidney disease stage IV with baseline creatinine of 1.9. She was recently at a nursing home and then transitioned back to home. Her husband reports that she has not been drinking or eating very much. When she presented this time her creatinine was quite high at 9. With IV fluid hydration creatinine has come down to 7. Urine output not being recorded. She also has significantly high WBC count of 30.5. Blood and urine cultures are thus far negative. Patient has history of left UPJ obstruction with left-sided hydroureteronephrosis.  Patient is status post stent placement.  Objective:  Vital signs in last 24 hours:  Temp:  [97.5 F (36.4 C)-100.3 F (37.9 C)] 97.5 F (36.4 C) (08/17 1300) Pulse Rate:  [91-109] 101 (08/17 1300) Resp:  [17-22] 22 (08/17 1300) BP: (94-126)/(36-58) 118/45 (08/17 1300) SpO2:  [96 %-100 %] 99 % (08/17 1300) Weight:  [86.2 kg (190 lb 1.6 oz)] 86.2 kg (190 lb 1.6 oz) (08/17 0500)  Weight change:  Filed Weights   03/04/16 1000 03/05/16 0500  Weight: 81.6 kg (180 lb) 86.2 kg (190 lb 1.6 oz)    Intake/Output: I/O last 3 completed shifts: In: 1231 [I.V.:661; Blood:570] Out: 0    Intake/Output this shift:  Total I/O In: 1372 [I.V.:1372] Out: -   Physical Exam: General: No acute distress  Head: Normocephalic, atraumatic. Moist oral mucosal membranes  Eyes: Anicteric  Neck: Supple, trachea midline  Lungs:  Clear to auscultation, normal effort  Heart: S1S2 no rubs  Abdomen:  Soft, nontender, bowel sounds present  Extremities: trace peripheral edema.  Neurologic: Nonfocal, moving all four extremities  Skin: No lesions  Access:     Basic Metabolic Panel:  Recent Labs Lab 03/04/16 1046 03/05/16 0538  NA 132*  136  K 4.4 3.8  CL 106 110  CO2 10* 11*  GLUCOSE 120* 93  BUN 89* 84*  CREATININE 9.33* 7.27*  CALCIUM 8.3* 8.1*    Liver Function Tests:  Recent Labs Lab 03/04/16 1046  AST 42*  ALT 38  ALKPHOS 88  BILITOT 0.8  PROT 7.6  ALBUMIN 2.6*   No results for input(s): LIPASE, AMYLASE in the last 168 hours. No results for input(s): AMMONIA in the last 168 hours.  CBC:  Recent Labs Lab 03/04/16 1147 03/04/16 2052 03/05/16 0538  WBC 28.7*  --  30.5*  NEUTROABS 25.7*  --   --   HGB 5.9* 7.6* 8.5*  HCT 18.8*  --  26.9*  MCV 82.1  --  84.6  PLT 285  --  347    Cardiac Enzymes:  Recent Labs Lab 03/04/16 1147  TROPONINI 0.10*    BNP: Invalid input(s): POCBNP  CBG:  Recent Labs Lab 03/04/16 1959 03/05/16 0014 03/05/16 0445 03/05/16 0734 03/05/16 1149  GLUCAP 104* 93 94 100* 61    Microbiology: Results for orders placed or performed during the hospital encounter of 03/04/16  Urine culture     Status: None (Preliminary result)   Collection Time: 03/04/16 10:50 AM  Result Value Ref Range Status   Specimen Description URINE, RANDOM  Final   Special Requests Normal  Final   Culture   Final    CULTURE REINCUBATED FOR BETTER GROWTH Performed at [facility]    Report Status PENDING  Incomplete  CULTURE, BLOOD (ROUTINE X 2) w Reflex to ID Panel     Status: None (Preliminary result)   Collection Time: 03/04/16  1:57 PM  Result Value Ref Range Status   Specimen Description BLOOD REJ  Final   Special Requests   Final    BOTTLES DRAWN AEROBIC AND ANAEROBIC AER 4ML ANA 3ML   Culture NO GROWTH < 24 HOURS  Final   Report Status PENDING  Incomplete  Gastrointestinal Panel by PCR , Stool     Status: None   Collection Time: 03/05/16  1:15 AM  Result Value Ref Range Status   Campylobacter species NOT DETECTED NOT DETECTED Final   Plesimonas shigelloides NOT DETECTED NOT DETECTED Final   Salmonella species NOT DETECTED NOT DETECTED Final   Yersinia  enterocolitica NOT DETECTED NOT DETECTED Final   Vibrio species NOT DETECTED NOT DETECTED Final   Vibrio cholerae NOT DETECTED NOT DETECTED Final   Enteroaggregative E coli (EAEC) NOT DETECTED NOT DETECTED Final   Enteropathogenic E coli (EPEC) NOT DETECTED NOT DETECTED Final   Enterotoxigenic E coli (ETEC) NOT DETECTED NOT DETECTED Final   Shiga like toxin producing E coli (STEC) NOT DETECTED NOT DETECTED Final   E. coli O157 NOT DETECTED NOT DETECTED Final   Shigella/Enteroinvasive E coli (EIEC) NOT DETECTED NOT DETECTED Final   Cryptosporidium NOT DETECTED NOT DETECTED Final   Cyclospora cayetanensis NOT DETECTED NOT DETECTED Final   Entamoeba histolytica NOT DETECTED NOT DETECTED Final   Giardia lamblia NOT DETECTED NOT DETECTED Final   Adenovirus F40/41 NOT DETECTED NOT DETECTED Final   Astrovirus NOT DETECTED NOT DETECTED Final   Norovirus GI/GII NOT DETECTED NOT DETECTED Final   Rotavirus A NOT DETECTED NOT DETECTED Final   Sapovirus (I, II, IV, and V) NOT DETECTED NOT DETECTED Final  C difficile quick scan w PCR reflex     Status: Abnormal   Collection Time: 03/05/16 11:20 AM  Result Value Ref Range Status   C Diff antigen POSITIVE (A) NEGATIVE Final   C Diff toxin POSITIVE (A) NEGATIVE Final   C Diff interpretation Toxin producing C. difficile detected.  Final    Comment: VALID CRITICAL RESULT CALLED TO, READ BACK BY AND VERIFIED WITH: ISABELLA ROBINSON 03/05/16 1439 KLW     Coagulation Studies: No results for input(s): LABPROT, INR in the last 72 hours.  Urinalysis:  Recent Labs  03/04/16 1050  COLORURINE YELLOW*  LABSPEC 1.025  PHURINE 5.5  GLUCOSEU NEGATIVE  HGBUR 3+*  BILIRUBINUR 1+*  KETONESUR TRACE*  PROTEINUR >300*  NITRITE NEGATIVE  LEUKOCYTESUR 3+*      Imaging: Dg Chest 2 View  Result Date: 03/04/2016 CLINICAL DATA:  Low blood pressure, weakness, question pneumonia, history COPD, hypertension, diabetes mellitus, former smoker EXAM: CHEST  2 VIEW  COMPARISON:  12/17/2015 FINDINGS: Enlargement of cardiac silhouette with slight vascular congestion. Mediastinal contours vascularity normal. Atherosclerotic calcification aorta. Lungs clear. Normal pulmonary infiltrate, pleural effusion, or pneumothorax. Bones unremarkable. IMPRESSION: Enlargement of cardiac silhouette with pulmonary vascular congestion. No acute infiltrate. Aortic atherosclerosis. Electronically Signed   By: Lavonia Dana M.D.   On: 03/04/2016 12:31   US Renal  Result Date: 03/04/2016 CLINICAL DATA:  Acute renal failure.  History of bladder cancer. EXAM: RENAL / URINARY TRACT ULTRASOUND COMPLETE COMPARISON:  CT scan 01/20/2016 FINDINGS: Right Kidney: Length: 10.6 cm.  Slight increase in echogenicity. Left Kidney: Length: 10.2 cm. 5.2 cm complex cyst identified interpolar left kidney, similar to previous CT scan. Mild distention of  the left intrarenal collecting system noted. Bladder: Moderately distended with circumferential bladder wall thickening. There is focal irregular bladder wall thickening in the region of the left UVJ. IMPRESSION: Mild left hydronephrosis. Focal irregular wall thickening in the region of the left UVJ, suspicious for neoplasm as seen on previous CT scan. Electronically Signed   By: Misty Stanley M.D.   On: 03/04/2016 15:25     Medications:     . cefTRIAXone (ROCEPHIN)  IV  1 g Intravenous q1800  . famotidine  20 mg Oral Daily  . ferrous sulfate  325 mg Oral BID WC  . folic acid  1 mg Oral Daily  . insulin aspart  0-9 Units Subcutaneous Q4H  . latanoprost  1 drop Both Eyes QHS  . sodium chloride flush  3 mL Intravenous Q12H   acetaminophen **OR** acetaminophen, albuterol, bisacodyl, ondansetron **OR** ondansetron (ZOFRAN) IV, polyethylene glycol  Assessment/ Plan:  75 y.o. female with long-standing hypertension, diabetes type 2, hyperlipidemia, osteoarthritis, obstructive sleep apnea, obesity, CKD stage IV, anemia of CKD, secondary hyperparathyroidism,  left renal cyst, left sided hydronephrosis s/p stent placement with removal of nephrostomy.  1.  Acute renal failure/chronic kidney disease stage IV.  Baseline creatinine 1.9-2.0.  Renal function significantly worse.  Could be related to acute tubular necrosis as she's not had very good by mouth intake at home.  Renal ultrasound shows left hydronephrosis which is less than before.  She is being followed by Dr. Erlene Quan for underlying bladder cancer.  Continue IV fluid hydration for now.  No urgent indication for dialysis at the moment.  2.  Anemia chronic kidney disease.  Hemoglobin currently up to 8.5 posttransfusion.  Continue to monitor.  Avoid Epogen given underlying bladder cancer.  3.  Left-sided hydronephrosis.  This is found to be less than before based upon renal ultrasound.  Patient had recent urologic intervention.  Continue ceftriaxone for now.  4.  Thanks for consult.   LOS: 1 Ramey [place] 8/17/20174:48 PM

## 2016-03-05 NOTE — Evaluation (Signed)
Physical Therapy Evaluation Patient Details Name: Barbara [name] MRN: [id number] DOB: [birth year] Today's Date: 03/05/2016   History of Present Illness  Pt admitted for acute renal failure. Pt brought to ED from pain clinic where her BP was 84/52 and HR 102. Per notes, pt had been feeling gradually weak for the past couple days. HGB at 5.6 upon admission, has increased to 8.5 as of this AM. PMH includes bladder cancer, COPD, DM, HTN, anemia, CAD, and CKD (stage 3).  Clinical Impression  Pt is a pleasant 75 y/o female who presents with generalized weakness. PLOF: Pt was independent with household ambulation with AD, only able to walk room to room. Pt requires assistance from husband for preparing meals and homemaking, but independent with bathing, grooming, and dressing herself. BP at beginning of session: 108/54. Pt requires mod assist for bed mobility, +2 min assist for transfers, and +2 min assist for ambulation. Pt requires moderate verbal cueing for sequencing of events for all mobility. Pt requires min assist to initiate rolling movement for changing briefs, min guard to maintain sidelying for hygiene. Pt reports she is anxious about sit/stand transfer due to leg weakness, bed elevated to ease concerns and ease transfer. Constant verbal and tactile cueing to decrease trunk flexion and to look forward with ambulation. Pt reports slight SOB, O2 WFL, pursed lip breathing encouraged. Pt will benefit from skilled PT services in order to improve strength, balance, endurance, functional mobility, and safety with DME use. At this time pt is appropriate for STR to address deficits.     Follow Up Recommendations SNF    Equipment Recommendations  None recommended by PT (Pt reports she has 2WRW at home)    Recommendations for Other Services       Precautions / Restrictions Restrictions Weight Bearing Restrictions: No      Mobility  Bed Mobility Overal bed mobility: Needs Assistance Bed Mobility:  Supine to Sit;Sit to Supine     Supine to sit: Mod assist Sit to supine: Mod assist   General bed mobility comments: Pt requires verbal cueing for proper sequencing of movement. Min assist to swing B LE off EOB, mod assist to lift trunk off of bed.   Transfers Overall transfer level: Needs assistance Equipment used: Rolling walker (2 wheeled) Transfers: Sit to/from Stand Sit to Stand: Min assist;+2 safety/equipment;From elevated surface         General transfer comment: Pt requires +2 min assist for sit/stand transfer. Verbal and tactile cueing for UE placement on RW. Pt was anxious to stand citing leg weakness. Bed elevated to ease transfer and ease concerns.  Ambulation/Gait Ambulation/Gait assistance: Min assist;+2 safety/equipment Ambulation Distance (Feet): 3 Feet Assistive device: Rolling walker (2 wheeled) Gait Pattern/deviations: Decreased stance time - right;Decreased stance time - left;Decreased stride length;Shuffle;Trunk flexed   Gait velocity interpretation: Below normal speed for age/gender General Gait Details: Pt takes very short steps, demonstrates a shuffle like gait. Requires verbal cueing for proper sequencing of movement. Increased trunk flexion, moderate verbal and tactile to stand up straight and look forward.   Stairs            Wheelchair Mobility    Modified Rankin (Stroke Patients Only)       Balance Overall balance assessment: Needs assistance Sitting-balance support: No upper extremity supported;Feet supported Sitting balance-Leahy Scale: Good Sitting balance - Comments: Pt able to maintain sitting balance with multidirectional perturbations, but pt reports she feels like she is going to fall over.   Standing balance  support: Bilateral upper extremity supported Standing balance-Leahy Scale: Good Standing balance comment: Pt able to maintain standing balance with B UE support on RW, however needs constant cueing to decrease trunk flexion  and look forward.                             Pertinent Vitals/Pain Pain Assessment: No/denies pain    Home Living Family/patient expects to be discharged to:: Private residence Living Arrangements: Spouse/significant other Available Help at Discharge: Family;Available PRN/intermittently (Husband) Type of Home: House Home Access: Stairs to enter Entrance Stairs-Rails: None Entrance Stairs-Number of Steps: 1 Home Layout: One level Home Equipment: Walker - 2 wheels;Cane - single point;Toilet riser Additional Comments: Pt's husband reports he is always home to assist pt.    Prior Function Level of Independence: Needs assistance   Gait / Transfers Assistance Needed: Pt reports she ambulates at home with [facility] and 2WRW. Pt ambulates household distances only (room to room)  ADL's / Homemaking Assistance Needed: Pt able to dress and bathe herself independently but requires assistance from husband for preparing meals.        Hand Dominance        Extremity/Trunk Assessment   Upper Extremity Assessment: Overall WFL for tasks assessed (UE grossly 4+/5)           Lower Extremity Assessment: Generalized weakness (LE grossly 4-/5)         Communication   Communication: Other (comment) (Hard to understand her speech, mumbling)  Cognition Arousal/Alertness: Awake/alert Behavior During Therapy: WFL for tasks assessed/performed Overall Cognitive Status: Within Functional Limits for tasks assessed                      General Comments      Exercises Other Exercises Other Exercises: Supine ther-ex: B LE ankle pumps, quad sets, SLRs, and hip abd/add x 10 reps with min-mod assist. Verbal and tactile cueing for proper technique.  Other Exercises: PT assistance to change brief. Pt requires min assist to initiate rolling movement in bed, min guard to maintain sidelying position for hygiene and brief change.      Assessment/Plan    PT Assessment Patient needs  continued PT services  PT Diagnosis Generalized weakness;Difficulty walking   PT Problem List Decreased strength;Decreased range of motion;Decreased activity tolerance;Decreased balance;Decreased mobility;Decreased coordination;Decreased cognition;Decreased knowledge of use of DME;Decreased safety awareness;Pain  PT Treatment Interventions DME instruction;Gait training;Stair training;Functional mobility training;Therapeutic activities;Therapeutic exercise;Balance training;Neuromuscular re-education;Patient/family education   PT Goals (Current goals can be found in the Care Plan section) Acute Rehab PT Goals Patient Stated Goal: To feel stronger PT Goal Formulation: With patient Time For Goal Achievement: 03/19/16 Potential to Achieve Goals: Good    Frequency Min 2X/week   Barriers to discharge        Co-evaluation               End of Session Equipment Utilized During Treatment: Gait belt Activity Tolerance: Patient limited by fatigue Patient left: in chair;with call bell/phone within reach;with chair alarm set;with family/visitor present Nurse Communication: Mobility status;Other (comment) (RN notified that IV beeping)         Time: 7628-3151 PT Time Calculation (min) (ACUTE ONLY): 44 min   Charges:   PT Evaluation $PT Eval Moderate Complexity: 1 Procedure PT Treatments $Therapeutic Exercise: 8-22 mins $Therapeutic Activity: 8-22 mins   PT G Codes:        Georgina Pillion 03/10/2016, 3:07 PM  Georgina Pillion, [place] [phone]

## 2016-03-05 NOTE — Progress Notes (Signed)
[facility] [place] NAME: Barbara [name]    MRN#:  [id number]  DATE OF BIRTH:  [birth year]  SUBJECTIVE:  Hospital Day: 1 day Barbara [name] is a 75 y.o. female presenting with Hypotension .   Overnight events: no overnight events Interval Events: complains of 1 week diarrhea, otherwise no complaints  REVIEW OF SYSTEMS:  CONSTITUTIONAL: No fever, positive fatigue or weakness.  EYES: No blurred or double vision.  EARS, NOSE, AND THROAT: No tinnitus or ear pain.  RESPIRATORY: No cough, shortness of breath, wheezing or hemoptysis.  CARDIOVASCULAR: No chest pain, orthopnea, edema.  GASTROINTESTINAL: No nausea, vomiting,positive  diarrhea denies abdominal pain.  GENITOURINARY: No dysuria, hematuria.  ENDOCRINE: No polyuria, nocturia,  HEMATOLOGY: No anemia, easy bruising or bleeding SKIN: No rash or lesion. MUSCULOSKELETAL: No joint pain or arthritis.   NEUROLOGIC: No tingling, numbness, weakness.  PSYCHIATRY: No anxiety or depression.   DRUG ALLERGIES:   Allergies  Allergen Reactions  . Percocet [Oxycodone-Acetaminophen] Hives    VITALS:  Blood pressure (!) 118/45, pulse (!) 101, temperature 97.5 F (36.4 C), temperature source Oral, resp. rate (!) 22, height 5' 8"  (1.727 m), weight 86.2 kg (190 lb 1.6 oz), SpO2 99 %.  PHYSICAL EXAMINATION:  VITAL SIGNS: Vitals:   03/05/16 0415 03/05/16 1300  BP: (!) 105/41 (!) 118/45  Pulse: 91 (!) 101  Resp: 20 (!) 22  Temp: 98.4 F (36.9 C) 97.5 F (36.4 C)   GENERAL:75 y.o.female currently in no acute distress.  HEAD: Normocephalic, atraumatic.  EYES: Pupils equal, round, reactive to light. Extraocular muscles intact. No scleral icterus.  MOUTH: Moist mucosal membrane. Dentition intact. No abscess noted.  EAR, NOSE, THROAT: Clear without exudates. No external lesions.  NECK: Supple. No thyromegaly. No nodules. No JVD.  PULMONARY: Clear to ascultation, without wheeze rails or rhonci.  No use of accessory muscles, Good respiratory effort. good air entry bilaterally CHEST: Nontender to palpation.  CARDIOVASCULAR: S1 and S2. Regular rate and rhythm. No murmurs, rubs, or gallops. No edema. Pedal pulses 2+ bilaterally.  GASTROINTESTINAL: Soft, nontender, nondistended. No masses. Positive bowel sounds. No hepatosplenomegaly.  MUSCULOSKELETAL: No swelling, clubbing, or edema. Range of motion full in all extremities.  NEUROLOGIC: Cranial nerves II through XII are intact. No gross focal neurological deficits. Sensation intact. Reflexes intact.  SKIN: No ulceration, lesions, rashes, or cyanosis. Skin warm and dry. Turgor intact.  PSYCHIATRIC: Mood, affect within normal limits. Seems a little confused ,, sleeping but arousable eventually alert and oriented x 3. Insight, judgment intact.      LABORATORY PANEL:   CBC  Recent Labs Lab 03/05/16 0538  WBC 30.5*  HGB 8.5*  HCT 26.9*  PLT 347   ------------------------------------------------------------------------------------------------------------------  Chemistries   Recent Labs Lab 03/04/16 1046 03/05/16 0538  NA 132* 136  K 4.4 3.8  CL 106 110  CO2 10* 11*  GLUCOSE 120* 93  BUN 89* 84*  CREATININE 9.33* 7.27*  CALCIUM 8.3* 8.1*  AST 42*  --   ALT 38  --   ALKPHOS 88  --   BILITOT 0.8  --    ------------------------------------------------------------------------------------------------------------------  Cardiac Enzymes  Recent Labs Lab 03/04/16 1147  TROPONINI 0.10*   ------------------------------------------------------------------------------------------------------------------  RADIOLOGY:  Dg Chest 2 View  Result Date: 03/04/2016 CLINICAL DATA:  Low blood pressure, weakness, question pneumonia, history COPD, hypertension, diabetes mellitus, former smoker EXAM: CHEST  2 VIEW COMPARISON:  12/17/2015 FINDINGS: Enlargement of cardiac silhouette with slight vascular congestion. Mediastinal contours  vascularity normal. Atherosclerotic calcification aorta. Lungs clear. Normal pulmonary infiltrate, pleural effusion, or pneumothorax. Bones unremarkable. IMPRESSION: Enlargement of cardiac silhouette with pulmonary vascular congestion. No acute infiltrate. Aortic atherosclerosis. Electronically Signed   By: Lavonia Dana M.D.   On: 03/04/2016 12:31   US Renal  Result Date: 03/04/2016 CLINICAL DATA:  Acute renal failure.  History of bladder cancer. EXAM: RENAL / URINARY TRACT ULTRASOUND COMPLETE COMPARISON:  CT scan 01/20/2016 FINDINGS: Right Kidney: Length: 10.6 cm.  Slight increase in echogenicity. Left Kidney: Length: 10.2 cm. 5.2 cm complex cyst identified interpolar left kidney, similar to previous CT scan. Mild distention of the left intrarenal collecting system noted. Bladder: Moderately distended with circumferential bladder wall thickening. There is focal irregular bladder wall thickening in the region of the left UVJ. IMPRESSION: Mild left hydronephrosis. Focal irregular wall thickening in the region of the left UVJ, suspicious for neoplasm as seen on previous CT scan. Electronically Signed   By: Misty Stanley M.D.   On: 03/04/2016 15:25    EKG:   Orders placed or performed during the hospital encounter of 03/04/16  . ED EKG  . ED EKG    ASSESSMENT AND PLAN:   Barbara [name] is a 75 y.o. female presenting with Hypotension . Admitted 03/04/2016 : Day #: 1 day   1. Acute renal failure: Nephrology consult, IV fluid hydration and follow urine output renal function  2. Sepsis due to UTI: Ceftriaxone, follow culture data  3. Diarrhea, C. difficile pending continue enteric precautions  4. Acute anemia over anemia of chronic disease: Status post 2 unit packed red blood cell transfusion  5 Hypertension essential Hold Norvasc  6 DVT prophylaxis with SCDs. No heparin or Lovenox in case patient needs procedures for any hydronephrosis.   All the records are reviewed and case discussed  with Care Management/Social Workerr. Management plans discussed with the patient, family and they are in agreement.  CODE STATUS: full TOTAL TIME TAKING CARE OF THIS PATIENT: 28 minutes.   POSSIBLE D/C IN 2-3DAYS, DEPENDING ON CLINICAL CONDITION.   Hower,  [name].D on 03/05/2016 at 1:16 PM  Between 7am to 6pm - Pager - [phone]  After 6pm: House Pager: - [facility]  Office  [phone]  CC: Primary care physician; Madelyn Brunner, MD

## 2016-03-05 NOTE — NC FL2 (Signed)
[place] LEVEL OF CARE SCREENING TOOL     IDENTIFICATION  Patient Name: Barbara [name] Birthdate: 16-Jun-1941 Sex: female Admission Date (Current Location): 03/04/2016  [facility] and [place] Number:  Selena Lesser  ([id number]) Facility and Address:  [facility], [address] [place], [place] [address]      Provider Number: [id number]  Attending Physician Name and Address:  Lytle Butte, MD  Relative Name and Phone Number:       Current Level of Care: Hospital Recommended Level of Care: [place] Prior Approval Number:    Date Approved/Denied:   PASRR Number:  ( [id number] )  Discharge Plan: SNF    Current Diagnoses: Patient Active Problem List   Diagnosis Date Noted  . ARF (acute renal failure) ([place]) 03/04/2016  . Cancer of trigone of urinary bladder ([place]) 03/02/2016  . Absolute anemia 02/04/2016  . Airway hyperreactivity 02/04/2016  . Celiac disease 02/04/2016  . Gastric catarrh 02/04/2016  . Acid reflux 02/04/2016  . Combined fat and carbohydrate induced hyperlipemia 02/04/2016  . C. difficile colitis 01/22/2016  . Urinary obstruction 01/21/2016  . COPD (chronic obstructive pulmonary disease) ([place]) 01/21/2016  . Controlled type 2 diabetes mellitus with stage 4 chronic kidney disease, with long-term current use of insulin ([place]) 01/21/2016  . Essential hypertension 01/21/2016  . Acute renal failure superimposed on stage 4 chronic kidney disease ([place]) 01/20/2016  . Anemia in chronic kidney disease 01/20/2016  . Sepsis ([place]) 12/18/2015  . UTI (lower urinary tract infection) 12/18/2015  . Arthritis of knee, degenerative 05/10/2015  . Knee strain 03/26/2015  . Other intervertebral disc displacement, lumbar region 03/04/2015  . Degenerative arthritis of lumbar spine 03/04/2015  . Injury of tendon of upper extremity 11/12/2014  . Atherosclerosis of abdominal aorta ([place]) 11/02/2014  . Chronic kidney disease, stage IV  (severe) ([place]) 11/02/2014  . Obstructive apnea 11/02/2014  . Complete rotator cuff rupture of left shoulder 10/05/2014  . Arthritis of shoulder region, degenerative 10/05/2014  . CAD in native artery 12/08/2013  . Benign essential HTN 12/08/2013  . Type 2 diabetes mellitus ([place]) 12/08/2013    Orientation RESPIRATION BLADDER Height & Weight     Self, Place  Normal Continent Weight: 190 lb 1.6 oz (86.2 kg) Height:  5' 8"  (172.7 cm)  BEHAVIORAL SYMPTOMS/MOOD NEUROLOGICAL BOWEL NUTRITION STATUS   (none )  (none) Incontinent Diet (NPO )  AMBULATORY STATUS COMMUNICATION OF NEEDS Skin   Extensive Assist Verbally Normal                       Personal Care Assistance Level of Assistance  Bathing, Feeding, Dressing Bathing Assistance: Limited assistance Feeding assistance: Independent Dressing Assistance: Limited assistance     Functional Limitations Info  Sight, Hearing, Speech Sight Info: Adequate Hearing Info: Adequate Speech Info: Adequate    SPECIAL CARE FACTORS FREQUENCY  PT (By licensed PT)     PT Frequency:  (5)              Contractures      Additional Factors Info  Code Status, Allergies, Insulin Sliding Scale, Isolation Precautions Code Status Info:  (Full Code. ) Allergies Info:  (Percocet Oxycodone-acetaminophen)   Insulin Sliding Scale Info:  (NovoLog Insulin Injections 3 times per day.) Isolation Precautions Info:  (Enteric Precautions )     Current Medications (03/05/2016):  This is the current hospital active medication list Current Facility-Administered Medications  Medication Dose Route Frequency Provider Last Rate Last  Dose  . 0.9 %  sodium chloride infusion   Intravenous Continuous Lytle Butte, MD      . acetaminophen (TYLENOL) suppository 650 mg  650 mg Rectal Q6H PRN Alexis Hugelmeyer, DO       Or  . acetaminophen (TYLENOL) tablet 650 mg  650 mg Oral Q6H PRN Alexis Hugelmeyer, DO   650 mg at 03/05/16 1612  . albuterol (PROVENTIL) (2.5  MG/3ML) 0.083% nebulizer solution 2.5 mg  2.5 mg Nebulization Q2H PRN Srikar Sudini, MD      . bisacodyl (DULCOLAX) suppository 10 mg  10 mg Rectal Daily PRN Srikar Sudini, MD      . cefTRIAXone (ROCEPHIN) 1 g in dextrose 5 % 50 mL IVPB  1 g Intravenous q1800 Lytle Butte, MD      . famotidine (PEPCID) tablet 20 mg  20 mg Oral Daily Hillary Bow, MD   20 mg at 03/05/16 0936  . ferrous sulfate tablet 325 mg  325 mg Oral BID WC Hillary Bow, MD   325 mg at 03/05/16 0936  . folic acid (FOLVITE) tablet 1 mg  1 mg Oral Daily Hillary Bow, MD   1 mg at 03/05/16 0936  . insulin aspart (novoLOG) injection 0-9 Units  0-9 Units Subcutaneous Q4H Hillary Bow, MD   Stopped at 03/05/16 0547  . latanoprost (XALATAN) 0.005 % ophthalmic solution 1 drop  1 drop Both Eyes QHS Hillary Bow, MD   1 drop at 03/04/16 2121  . ondansetron (ZOFRAN) tablet 4 mg  4 mg Oral Q6H PRN Hillary Bow, MD       Or  . ondansetron (ZOFRAN) injection 4 mg  4 mg Intravenous Q6H PRN Srikar Sudini, MD      . polyethylene glycol (MIRALAX / GLYCOLAX) packet 17 g  17 g Oral Daily PRN Srikar Sudini, MD      . sodium chloride flush (NS) 0.9 % injection 3 mL  3 mL Intravenous Q12H Hillary Bow, MD   3 mL at 03/04/16 2121     Discharge Medications: Please see discharge summary for a list of discharge medications.  Relevant Imaging Results:  Relevant Lab Results:   Additional Information  (SSN: [id number])  Lamont Tant, Veronia Beets, LCSW

## 2016-03-06 LAB — TYPE AND SCREEN
ABO/RH(D): O POS
Antibody Screen: NEGATIVE
UNIT DIVISION: 0
UNIT DIVISION: 0

## 2016-03-06 LAB — GLUCOSE, CAPILLARY
GLUCOSE-CAPILLARY: 78 mg/dL (ref 65–99)
GLUCOSE-CAPILLARY: 86 mg/dL (ref 65–99)
GLUCOSE-CAPILLARY: 92 mg/dL (ref 65–99)
Glucose-Capillary: 86 mg/dL (ref 65–99)
Glucose-Capillary: 143 mg/dL — ABNORMAL HIGH (ref 65–99)
Glucose-Capillary: 107 mg/dL — ABNORMAL HIGH (ref 65–99)
Glucose-Capillary: 127 mg/dL — ABNORMAL HIGH (ref 65–99)

## 2016-03-06 LAB — BASIC METABOLIC PANEL
ANION GAP: 15 (ref 5–15)
BUN: 94 mg/dL — ABNORMAL HIGH (ref 6–20)
CHLORIDE: 114 mmol/L — AB (ref 101–111)
CO2: 9 mmol/L — AB (ref 22–32)
Calcium: 8.1 mg/dL — ABNORMAL LOW (ref 8.9–10.3)
Creatinine, Ser: 6.24 mg/dL — ABNORMAL HIGH (ref 0.44–1.00)
GFR calc non Af Amer: 6 mL/min — ABNORMAL LOW (ref >60)
GFR, EST AFRICAN AMERICAN: 7 mL/min — AB (ref >60)
Glucose, Bld: 90 mg/dL (ref 65–99)
Potassium: 3.5 mmol/L (ref 3.5–5.1)
SODIUM: 138 mmol/L (ref 135–145)

## 2016-03-06 LAB — URINE CULTURE: Special Requests: NORMAL

## 2016-03-06 MED ORDER — STERILE WATER FOR INJECTION IV SOLN
INTRAVENOUS | Status: DC
  Administered 2016-03-06 – 2016-03-10 (×6): via INTRAVENOUS
  Filled 2016-03-06 (×14): qty 850

## 2016-03-06 MED ORDER — VANCOMYCIN 50 MG/ML ORAL SOLUTION
125.0000 mg | Freq: Four times a day (QID) | ORAL | Status: DC
  Administered 2016-03-06: 125 mg via ORAL
  Filled 2016-03-06 (×4): qty 2.5

## 2016-03-06 MED ORDER — VANCOMYCIN 50 MG/ML ORAL SOLUTION
500.0000 mg | Freq: Four times a day (QID) | ORAL | Status: DC
  Administered 2016-03-06 – 2016-03-11 (×20): 500 mg via ORAL
  Filled 2016-03-06 (×24): qty 10

## 2016-03-06 MED ORDER — HEPARIN SODIUM (PORCINE) 5000 UNIT/ML IJ SOLN
5000.0000 [IU] | Freq: Three times a day (TID) | INTRAMUSCULAR | Status: DC
  Administered 2016-03-06 – 2016-03-11 (×15): 5000 [IU] via SUBCUTANEOUS
  Filled 2016-03-06 (×16): qty 1

## 2016-03-06 NOTE — Care Management Important Message (Signed)
Important Message  Patient Details  Name: Barbara [name] MRN: [id number] Date of Birth: Jan 11, 1941   Medicare Important Message Given:       Jolly Mango, RN 03/06/2016, 3:01 PM

## 2016-03-06 NOTE — Clinical Social Work Note (Signed)
Clinical Social Work Assessment  Patient Details  Name: Barbara [name] MRN: [id number] Date of Birth: [birth year]  Date of referral:  03/06/16               Reason for consult:  Facility Placement                Permission sought to share information with:  Family Supports, Chartered certified accountant granted to share information::  Yes, Verbal Permission Granted  Name::        Agency::     Relationship::     Contact Information:     Housing/Transportation Living arrangements for the past 2 months:  Single Family Home Source of Information:  Patient (niece and brother) Patient Interpreter Needed:  None Criminal Activity/Legal Involvement Pertinent to Current Situation/Hospitalization:  No - Comment as needed Significant Relationships:  Spouse, Other Family Members Lives with:  Spouse Do you feel safe going back to the place where you live?  Yes Need for family participation in patient care:  Yes (Comment)  Care giving concerns:  Patient requires assistance with ADL's.   Social Worker assessment / plan:  CSW spoke with patient this morning along with her niece and brother. Patient's husband was at home. Patient was lethargic and answered most questions appropriately. Patient states that she is agreeable to rehab but does not want [facility] or [facility]. Patient states she has been to both. CSW contacted patient's husband via phone and he is aware of the recommendation for STR now but states his wife is not going to be happy about it. He states he will come up to see her shortly. Bedsearch to be initiated.   Employment status:  Retired Nurse, adult PT Recommendations:  [place] / Referral to community resources:  [place]  Patient/Family's Response to care:  Patient expressed appreciation for CSW assistance.   Patient/Family's Understanding of and Emotional Response to Diagnosis,  Current Treatment, and Prognosis:  It is unclear if patient fully understands her limitations at this time.   Emotional Assessment Appearance:  Appears stated age Attitude/Demeanor/Rapport:   (lethargic but appropriate and pleasant) Affect (typically observed):  Accepting, Adaptable, Quiet, Calm, Appropriate Orientation:  Oriented to Self, Oriented to Place, Oriented to Situation Alcohol / Substance use:  Not Applicable Psych involvement (Current and /or in the community):  No (Comment)  Discharge Needs  Concerns to be addressed:  Care Coordination Readmission within the last 30 days:  No Current discharge risk:  None Barriers to Discharge:  No Barriers Identified   Shela Leff, LCSW 03/06/2016, 10:03 AM

## 2016-03-06 NOTE — NC FL2 (Signed)
[place] LEVEL OF CARE SCREENING TOOL     IDENTIFICATION  Patient Name: Barbara [name] Birthdate: 1940-11-29 Sex: female Admission Date (Current Location): 03/04/2016  [facility] and [place] Number:  Selena Lesser  ([id number] [phone]) Facility and Address:  [facility], [address] [place], [place] [address]      Provider Number: [id number]  Attending Physician Name and Address:  Lytle Butte, MD  Relative Name and Phone Number:       Current Level of Care: Hospital Recommended Level of Care: [place] Prior Approval Number:    Date Approved/Denied:   PASRR Number: [id number]  Discharge Plan: SNF    Current Diagnoses: Patient Active Problem List   Diagnosis Date Noted  . ARF (acute renal failure) ([place]) 03/04/2016  . Cancer of trigone of urinary bladder ([place]) 03/02/2016  . Absolute anemia 02/04/2016  . Airway hyperreactivity 02/04/2016  . Celiac disease 02/04/2016  . Gastric catarrh 02/04/2016  . Acid reflux 02/04/2016  . Combined fat and carbohydrate induced hyperlipemia 02/04/2016  . C. difficile colitis 01/22/2016  . Urinary obstruction 01/21/2016  . COPD (chronic obstructive pulmonary disease) ([place]) 01/21/2016  . Controlled type 2 diabetes mellitus with stage 4 chronic kidney disease, with long-term current use of insulin ([place]) 01/21/2016  . Essential hypertension 01/21/2016  . Acute renal failure superimposed on stage 4 chronic kidney disease ([place]) 01/20/2016  . Anemia in chronic kidney disease 01/20/2016  . Sepsis ([place]) 12/18/2015  . UTI (lower urinary tract infection) 12/18/2015  . Arthritis of knee, degenerative 05/10/2015  . Knee strain 03/26/2015  . Other intervertebral disc displacement, lumbar region 03/04/2015  . Degenerative arthritis of lumbar spine 03/04/2015  . Injury of tendon of upper extremity 11/12/2014  . Atherosclerosis of abdominal aorta ([place]) 11/02/2014  . Chronic kidney disease, stage IV  (severe) ([place]) 11/02/2014  . Obstructive apnea 11/02/2014  . Complete rotator cuff rupture of left shoulder 10/05/2014  . Arthritis of shoulder region, degenerative 10/05/2014  . CAD in native artery 12/08/2013  . Benign essential HTN 12/08/2013  . Type 2 diabetes mellitus ([place]) 12/08/2013    Orientation RESPIRATION BLADDER Height & Weight     Self, Situation, Place  Normal Incontinent Weight: 194 lb 12.8 oz (88.4 kg) Height:  5' 8"  (172.7 cm)  BEHAVIORAL SYMPTOMS/MOOD NEUROLOGICAL BOWEL NUTRITION STATUS   (none)  (none) Incontinent Diet (clear liquid)  AMBULATORY STATUS COMMUNICATION OF NEEDS Skin   Total Care Verbally Normal                       Personal Care Assistance Level of Assistance  Bathing, Dressing Bathing Assistance: Maximum assistance Feeding assistance: Limited assistance Dressing Assistance: Maximum assistance     Functional Limitations Info  Hearing, Speech Sight Info: Adequate Hearing Info: Impaired Speech Info: Impaired    SPECIAL CARE FACTORS FREQUENCY  PT (By licensed PT)     PT Frequency:  (5)              Contractures Contractures Info: Not present    Additional Factors Info  Code Status, Allergies, Isolation Precautions Code Status Info: full Allergies Info: percocent   Insulin Sliding Scale Info:  (NovoLog Insulin Injections 3 times per day.) Isolation Precautions Info: cdiff     Current Medications (03/06/2016):  This is the current hospital active medication list Current Facility-Administered Medications  Medication Dose Route Frequency Provider Last Rate Last Dose  . 0.9 %  sodium chloride infusion   Intravenous Continuous  Lytle Butte, MD 75 mL/hr at 03/05/16 1812    . acetaminophen (TYLENOL) suppository 650 mg  650 mg Rectal Q6H PRN Alexis Hugelmeyer, DO       Or  . acetaminophen (TYLENOL) tablet 650 mg  650 mg Oral Q6H PRN Alexis Hugelmeyer, DO   650 mg at 03/05/16 1612  . albuterol (PROVENTIL) (2.5 MG/3ML) 0.083%  nebulizer solution 2.5 mg  2.5 mg Nebulization Q2H PRN Srikar Sudini, MD      . bisacodyl (DULCOLAX) suppository 10 mg  10 mg Rectal Daily PRN Srikar Sudini, MD      . cefTRIAXone (ROCEPHIN) 1 g in dextrose 5 % 50 mL IVPB  1 g Intravenous q1800 Lytle Butte, MD   1 g at 03/05/16 1812  . famotidine (PEPCID) tablet 20 mg  20 mg Oral Daily Hillary Bow, MD   20 mg at 03/05/16 0936  . ferrous sulfate tablet 325 mg  325 mg Oral BID WC Hillary Bow, MD   325 mg at 03/05/16 1812  . folic acid (FOLVITE) tablet 1 mg  1 mg Oral Daily Srikar Sudini, MD   1 mg at 03/05/16 0936  . heparin injection 5,000 Units  5,000 Units Subcutaneous Q8H Lytle Butte, MD      . insulin aspart (novoLOG) injection 0-9 Units  0-9 Units Subcutaneous Q4H Hillary Bow, MD   Stopped at 03/05/16 0547  . latanoprost (XALATAN) 0.005 % ophthalmic solution 1 drop  1 drop Both Eyes QHS Hillary Bow, MD   1 drop at 03/05/16 2140  . ondansetron (ZOFRAN) tablet 4 mg  4 mg Oral Q6H PRN Hillary Bow, MD       Or  . ondansetron (ZOFRAN) injection 4 mg  4 mg Intravenous Q6H PRN Srikar Sudini, MD      . polyethylene glycol (MIRALAX / GLYCOLAX) packet 17 g  17 g Oral Daily PRN Srikar Sudini, MD      . sodium chloride flush (NS) 0.9 % injection 3 mL  3 mL Intravenous Q12H Hillary Bow, MD   3 mL at 03/05/16 2139  . vancomycin (VANCOCIN) 50 mg/mL oral solution 125 mg  125 mg Oral QID Lytle Butte, MD         Discharge Medications: Please see discharge summary for a list of discharge medications.  Relevant Imaging Results:  Relevant Lab Results:   Additional Information SS: [id number]  Shela Leff, LCSW

## 2016-03-06 NOTE — Care Management Note (Signed)
Case Management Note  Patient Details  Name: Barbara [name] MRN: [id number] Date of Birth: 08-10-1940  Subjective/Objective:                  Spoke with patient's daughter Parke Simmers [phone].Patient lives at home with her husband. She was able to make decisions until today- but today "she is talking out her head; Calling dead peoples names and seeing things we don't see". She was able to walk with cane until going to [facility] recently and now she needs a walker. She cannot stay at home without 24 /7 care per [facility]. They want her to go to a facility for a little while in hopes that she will become stronger. Patient don't want [facility] but family does. [place] is closer to patient's home address and husband can't see well to drive. She has never had home health services before per [facility]. Parke Simmers states that [facility] was setting up some home care through [facility] out of [place]. It is not related to [place]. She states that her Medicaid has been covering this. Shes been getting personal care 2 hours day every day for last 3-4 years. She has a wheelchair also.   Action/Plan: CSW to follow for SNF.   Expected Discharge Date:  03/06/16               Expected Discharge Plan:     In-House Referral:     Discharge planning Services  CM Consult  Post Acute Care Choice:    Choice offered to:  Adult Children  DME Arranged:    DME Agency:     HH Arranged:    HH Agency:     Status of Service:  In process, will continue to follow  If discussed at Long Length of Stay Meetings, dates discussed:    Additional Comments:  Marshell Garfinkel, RN 03/06/2016, 4:15 PM

## 2016-03-06 NOTE — Progress Notes (Signed)
[place] [facility] [place] NAME: Barbara [name]    MRN#:  [id number]  DATE OF BIRTH:  1941/06/20  SUBJECTIVE:  Hospital Day: 2 days Barbara [name] is a 75 y.o. female presenting with Hypotension .   Overnight events: no overnight events Interval Events: weak, no other complaints  REVIEW OF SYSTEMS:  CONSTITUTIONAL: No fever, positive fatigue or weakness.  EYES: No blurred or double vision.  EARS, NOSE, AND THROAT: No tinnitus or ear pain.  RESPIRATORY: No cough, shortness of breath, wheezing or hemoptysis.  CARDIOVASCULAR: No chest pain, orthopnea, edema.  GASTROINTESTINAL: No nausea, vomiting,positive  diarrhea denies abdominal pain.  GENITOURINARY: No dysuria, hematuria.  ENDOCRINE: No polyuria, nocturia,  HEMATOLOGY: No anemia, easy bruising or bleeding SKIN: No rash or lesion. MUSCULOSKELETAL: No joint pain or arthritis.   NEUROLOGIC: No tingling, numbness, weakness.  PSYCHIATRY: No anxiety or depression.   DRUG ALLERGIES:   Allergies  Allergen Reactions  . Percocet [Oxycodone-Acetaminophen] Hives    VITALS:  Blood pressure (!) 118/40, pulse (!) 103, temperature 98.3 F (36.8 C), temperature source Oral, resp. rate 18, height 5' 8"  (1.727 m), weight 88.4 kg (194 lb 12.8 oz), SpO2 97 %.  PHYSICAL EXAMINATION:  VITAL SIGNS: Vitals:   03/05/16 2015 03/06/16 0013  BP: (!) 99/40 (!) 118/40  Pulse: (!) 103   Resp: 18   Temp: 98.3 F (36.8 C)    GENERAL:75 y.o.female currently in no acute distress.  HEAD: Normocephalic, atraumatic.  EYES: Pupils equal, round, reactive to light. Extraocular muscles intact. No scleral icterus.  MOUTH: Moist mucosal membrane. Dentition intact. No abscess noted.  EAR, NOSE, THROAT: Clear without exudates. No external lesions.  NECK: Supple. No thyromegaly. No nodules. No JVD.  PULMONARY: Clear to ascultation, without wheeze rails or rhonci. No use of accessory muscles, Good respiratory effort.  good air entry bilaterally CHEST: Nontender to palpation.  CARDIOVASCULAR: S1 and S2. Regular rate and rhythm. No murmurs, rubs, or gallops. No edema. Pedal pulses 2+ bilaterally.  GASTROINTESTINAL: Soft, nontender, nondistended. No masses. Positive bowel sounds. No hepatosplenomegaly.  MUSCULOSKELETAL: No swelling, clubbing, or edema. Range of motion full in all extremities.  NEUROLOGIC: Cranial nerves II through XII are intact. No gross focal neurological deficits. Sensation intact. Reflexes intact.  SKIN: No ulceration, lesions, rashes, or cyanosis. Skin warm and dry. Turgor intact.  PSYCHIATRIC: Mood, affect within normal limits. Seems a little confused  alert and oriented x 3. Insight, judgment intact.      LABORATORY PANEL:   CBC  Recent Labs Lab 03/05/16 0538  WBC 30.5*  HGB 8.5*  HCT 26.9*  PLT 347   ------------------------------------------------------------------------------------------------------------------  Chemistries   Recent Labs Lab 03/04/16 1046  03/06/16 0550  NA 132*  < > 138  K 4.4  < > 3.5  CL 106  < > 114*  CO2 10*  < > 9*  GLUCOSE 120*  < > 90  BUN 89*  < > 94*  CREATININE 9.33*  < > 6.24*  CALCIUM 8.3*  < > 8.1*  AST 42*  --   --   ALT 38  --   --   ALKPHOS 88  --   --   BILITOT 0.8  --   --   < > = values in this interval not displayed. ------------------------------------------------------------------------------------------------------------------  Cardiac Enzymes  Recent Labs Lab 03/04/16 1147  TROPONINI 0.10*   ------------------------------------------------------------------------------------------------------------------  RADIOLOGY:  Dg Chest 2 View  Result Date: 03/04/2016 CLINICAL DATA:  Low blood pressure, weakness, question pneumonia, history COPD, hypertension, diabetes mellitus, former smoker EXAM: CHEST  2 VIEW COMPARISON:  12/17/2015 FINDINGS: Enlargement of cardiac silhouette with slight vascular congestion.  Mediastinal contours vascularity normal. Atherosclerotic calcification aorta. Lungs clear. Normal pulmonary infiltrate, pleural effusion, or pneumothorax. Bones unremarkable. IMPRESSION: Enlargement of cardiac silhouette with pulmonary vascular congestion. No acute infiltrate. Aortic atherosclerosis. Electronically Signed   By: Lavonia Dana M.D.   On: 03/04/2016 12:31   US Renal  Result Date: 03/04/2016 CLINICAL DATA:  Acute renal failure.  History of bladder cancer. EXAM: RENAL / URINARY TRACT ULTRASOUND COMPLETE COMPARISON:  CT scan 01/20/2016 FINDINGS: Right Kidney: Length: 10.6 cm.  Slight increase in echogenicity. Left Kidney: Length: 10.2 cm. 5.2 cm complex cyst identified interpolar left kidney, similar to previous CT scan. Mild distention of the left intrarenal collecting system noted. Bladder: Moderately distended with circumferential bladder wall thickening. There is focal irregular bladder wall thickening in the region of the left UVJ. IMPRESSION: Mild left hydronephrosis. Focal irregular wall thickening in the region of the left UVJ, suspicious for neoplasm as seen on previous CT scan. Electronically Signed   By: Misty Stanley M.D.   On: 03/04/2016 15:25    EKG:   Orders placed or performed during the hospital encounter of 03/04/16  . ED EKG  . ED EKG    ASSESSMENT AND PLAN:   Barbara [name] is a 75 y.o. female presenting with Hypotension . Admitted 03/04/2016 : Day #: 2 days   1. Acute renal failure: Nephrology consult, IV fluid hydration and follow urine output renal function  2. Sepsis due to UTI: Ceftriaxone, follow culture data #3/3  3. Diarrhea, C. difficile Positive - criteria for severe CDI - po vanco  4. Acute anemia over anemia of chronic disease: Status post 2 unit packed red blood cell transfusion-stable  5 Hypertension essential Hold Norvasc  6 DVT prophylaxis with SCDs. heparin   All the records are reviewed and case discussed with Care Management/Social  Workerr. Management plans discussed with the patient, family and they are in agreement.  CODE STATUS: full TOTAL TIME TAKING CARE OF THIS PATIENT: 33 minutes.   POSSIBLE D/C IN 2-3DAYS, DEPENDING ON CLINICAL CONDITION.   [name],  [name].D on 03/06/2016 at 8:41 AM  Between 7am to 6pm - Pager - [phone]  After 6pm: House Pager: - [phone]  [facility]  Office  [phone]  CC: Primary care physician; Madelyn Brunner, MD

## 2016-03-06 NOTE — Progress Notes (Signed)
[facility]  ROUNDING NOTE   Subjective:  Patient still has significant renal dysfunction. BUN is up to 94 however creatinine down to 6.2. Urine output not recorded. Patient currently awake and alert and will follow commands. She appears to have C. difficile colitis.  Objective:  Vital signs in last 24 hours:  Temp:  [97.5 F (36.4 C)-98.3 F (36.8 C)] 98.3 F (36.8 C) (08/17 2015) Pulse Rate:  [101-103] 103 (08/17 2015) Resp:  [18-22] 18 (08/17 2015) BP: (99-118)/(40-45) 118/40 (08/18 0013) SpO2:  [97 %-99 %] 97 % (08/17 2015) Weight:  [88.4 kg (194 lb 12.8 oz)] 88.4 kg (194 lb 12.8 oz) (08/18 0419)  Weight change: 6.713 kg (14 lb 12.8 oz) Filed Weights   03/04/16 1000 03/05/16 0500 03/06/16 0419  Weight: 81.6 kg (180 lb) 86.2 kg (190 lb 1.6 oz) 88.4 kg (194 lb 12.8 oz)    Intake/Output: I/O last 3 completed shifts: In: 3054 [I.V.:2484; Blood:570] Out: 0    Intake/Output this shift:  No intake/output data recorded.  Physical Exam: General: No acute distress  Head: Normocephalic, atraumatic. Moist oral mucosal membranes  Eyes: Anicteric  Neck: Supple, trachea midline  Lungs:  Clear to auscultation, normal effort  Heart: S1S2 no rubs  Abdomen:  Soft, nontender, bowel sounds present  Extremities: trace peripheral edema.  Neurologic: Nonfocal, moving all four extremities  Skin: No lesions  Access:     Basic Metabolic Panel:  Recent Labs Lab 03/04/16 1046 03/05/16 0538 03/06/16 0550  NA 132* 136 138  K 4.4 3.8 3.5  CL 106 110 114*  CO2 10* 11* 9*  GLUCOSE 120* 93 90  BUN 89* 84* 94*  CREATININE 9.33* 7.27* 6.24*  CALCIUM 8.3* 8.1* 8.1*    Liver Function Tests:  Recent Labs Lab 03/04/16 1046  AST 42*  ALT 38  ALKPHOS 88  BILITOT 0.8  PROT 7.6  ALBUMIN 2.6*   No results for input(s): LIPASE, AMYLASE in the last 168 hours. No results for input(s): AMMONIA in the last 168 hours.  CBC:  Recent Labs Lab 03/04/16 1147  03/04/16 2052 03/05/16 0538  WBC 28.7*  --  30.5*  NEUTROABS 25.7*  --   --   HGB 5.9* 7.6* 8.5*  HCT 18.8*  --  26.9*  MCV 82.1  --  84.6  PLT 285  --  347    Cardiac Enzymes:  Recent Labs Lab 03/04/16 1147  TROPONINI 0.10*    BNP: Invalid input(s): POCBNP  CBG:  Recent Labs Lab 03/05/16 2012 03/06/16 0007 03/06/16 0134 03/06/16 0414 03/06/16 0735  GLUCAP 94 78 92 86 86    Microbiology: Results for orders placed or performed during the hospital encounter of 03/04/16  Urine culture     Status: Abnormal   Collection Time: 03/04/16 10:50 AM  Result Value Ref Range Status   Specimen Description URINE, RANDOM  Final   Special Requests Normal  Final   Culture MULTIPLE SPECIES PRESENT, SUGGEST RECOLLECTION (A)  Final   Report Status 03/06/2016 FINAL  Final  CULTURE, BLOOD (ROUTINE X 2) w Reflex to ID Panel     Status: None (Preliminary result)   Collection Time: 03/04/16  1:57 PM  Result Value Ref Range Status   Specimen Description BLOOD REJ  Final   Special Requests   Final    BOTTLES DRAWN AEROBIC AND ANAEROBIC AER 4ML ANA 3ML   Culture NO GROWTH 2 DAYS  Final   Report Status PENDING  Incomplete  Gastrointestinal Panel by  PCR , Stool     Status: None   Collection Time: 03/05/16  1:15 AM  Result Value Ref Range Status   Campylobacter species NOT DETECTED NOT DETECTED Final   Plesimonas shigelloides NOT DETECTED NOT DETECTED Final   Salmonella species NOT DETECTED NOT DETECTED Final   Yersinia enterocolitica NOT DETECTED NOT DETECTED Final   Vibrio species NOT DETECTED NOT DETECTED Final   Vibrio cholerae NOT DETECTED NOT DETECTED Final   Enteroaggregative E coli (EAEC) NOT DETECTED NOT DETECTED Final   Enteropathogenic E coli (EPEC) NOT DETECTED NOT DETECTED Final   Enterotoxigenic E coli (ETEC) NOT DETECTED NOT DETECTED Final   Shiga like toxin producing E coli (STEC) NOT DETECTED NOT DETECTED Final   E. coli O157 NOT DETECTED NOT DETECTED Final    Shigella/Enteroinvasive E coli (EIEC) NOT DETECTED NOT DETECTED Final   Cryptosporidium NOT DETECTED NOT DETECTED Final   Cyclospora cayetanensis NOT DETECTED NOT DETECTED Final   Entamoeba histolytica NOT DETECTED NOT DETECTED Final   Giardia lamblia NOT DETECTED NOT DETECTED Final   Adenovirus F40/41 NOT DETECTED NOT DETECTED Final   Astrovirus NOT DETECTED NOT DETECTED Final   Norovirus GI/GII NOT DETECTED NOT DETECTED Final   Rotavirus A NOT DETECTED NOT DETECTED Final   Sapovirus (I, II, IV, and V) NOT DETECTED NOT DETECTED Final  C difficile quick scan w PCR reflex     Status: Abnormal   Collection Time: 03/05/16 11:20 AM  Result Value Ref Range Status   C Diff antigen POSITIVE (A) NEGATIVE Final   C Diff toxin POSITIVE (A) NEGATIVE Final   C Diff interpretation Toxin producing C. difficile detected.  Final    Comment: VALID CRITICAL RESULT CALLED TO, READ BACK BY AND VERIFIED WITH: ISABELLA ROBINSON 03/05/16 1439 KLW     Coagulation Studies: No results for input(s): LABPROT, INR in the last 72 hours.  Urinalysis:  Recent Labs  03/04/16 1050  COLORURINE YELLOW*  LABSPEC 1.025  PHURINE 5.5  GLUCOSEU NEGATIVE  HGBUR 3+*  BILIRUBINUR 1+*  KETONESUR TRACE*  PROTEINUR >300*  NITRITE NEGATIVE  LEUKOCYTESUR 3+*      Imaging: Dg Chest 2 View  Result Date: 03/04/2016 CLINICAL DATA:  Low blood pressure, weakness, question pneumonia, history COPD, hypertension, diabetes mellitus, former smoker EXAM: CHEST  2 VIEW COMPARISON:  12/17/2015 FINDINGS: Enlargement of cardiac silhouette with slight vascular congestion. Mediastinal contours vascularity normal. Atherosclerotic calcification aorta. Lungs clear. Normal pulmonary infiltrate, pleural effusion, or pneumothorax. Bones unremarkable. IMPRESSION: Enlargement of cardiac silhouette with pulmonary vascular congestion. No acute infiltrate. Aortic atherosclerosis. Electronically Signed   By: Lavonia Dana M.D.   On: 03/04/2016 12:31    US Renal  Result Date: 03/04/2016 CLINICAL DATA:  Acute renal failure.  History of bladder cancer. EXAM: RENAL / URINARY TRACT ULTRASOUND COMPLETE COMPARISON:  CT scan 01/20/2016 FINDINGS: Right Kidney: Length: 10.6 cm.  Slight increase in echogenicity. Left Kidney: Length: 10.2 cm. 5.2 cm complex cyst identified interpolar left kidney, similar to previous CT scan. Mild distention of the left intrarenal collecting system noted. Bladder: Moderately distended with circumferential bladder wall thickening. There is focal irregular bladder wall thickening in the region of the left UVJ. IMPRESSION: Mild left hydronephrosis. Focal irregular wall thickening in the region of the left UVJ, suspicious for neoplasm as seen on previous CT scan. Electronically Signed   By: Misty Stanley M.D.   On: 03/04/2016 15:25     Medications:   . sodium chloride 75 mL/hr at 03/06/16 1057   .  cefTRIAXone (ROCEPHIN)  IV  1 g Intravenous q1800  . famotidine  20 mg Oral Daily  . ferrous sulfate  325 mg Oral BID WC  . folic acid  1 mg Oral Daily  . heparin subcutaneous  5,000 Units Subcutaneous Q8H  . insulin aspart  0-9 Units Subcutaneous Q4H  . latanoprost  1 drop Both Eyes QHS  . sodium chloride flush  3 mL Intravenous Q12H  . vancomycin  125 mg Oral QID   acetaminophen **OR** acetaminophen, albuterol, bisacodyl, ondansetron **OR** ondansetron (ZOFRAN) IV, polyethylene glycol  Assessment/ Plan:  75 y.o. female with long-standing hypertension, diabetes type 2, hyperlipidemia, osteoarthritis, obstructive sleep apnea, obesity, CKD stage IV, anemia of CKD, secondary hyperparathyroidism, left renal cyst, left sided hydronephrosis s/p stent placement with removal of nephrostomy.  1.  Acute renal failure/chronic kidney disease stage IV.  Baseline creatinine 1.9-2.0.  Renal function significantly worse At admission with creatinine of 9.  Could be related to acute tubular necrosis as she's not had very good by mouth intake  at home.  Renal ultrasound shows left hydronephrosis which is less than before.   - Continue IV fluid hydration at this point in time. Metabolic acidosis noted. We will switch the patient to sodium bicarbonate drip.  2.  Anemia chronic kidney disease.  No new hemoglobin today. Continue to periodically monitor. Avoid Procrit for now.  3.  Left-sided hydronephrosis.  This is found to be less than before based upon renal ultrasound.  Patient had recent urologic intervention.    4.  C. difficile colitis. Patient has been started on by mouth vancomycin.  5. Metabolic acidosis. Likely due to acute renal failure and use of normal saline. Discontinue normal saline and convert the patient to sodium bicarbonate drip.   LOS: 2 Chandy Tarman 8/18/201711:12 AM

## 2016-03-06 NOTE — Progress Notes (Signed)
Called Dr. Ara Kussmaul regarding patient's blood sugar of 78.  Instructed to check blood glucose in an hour and call back if it drops below 60.  Barbara [name]  03/06/2016 12:34 AM

## 2016-03-06 NOTE — Plan of Care (Signed)
Problem: Education: Goal: Knowledge of [facility] Education information/materials will improve Outcome: Not Progressing Needs family assistance.  Problem: Health Behavior/Discharge Planning: Goal: Ability to manage health-related needs will improve Outcome: Not Progressing Needs family assistance.  Problem: Activity: Goal: Risk for activity intolerance will decrease Outcome: Not Progressing Patient has generalized weakness.  Problem: Fluid Volume: Goal: Ability to maintain a balanced intake and output will improve Outcome: Not Progressing Patient is NPO.  Problem: Nutrition: Goal: Adequate nutrition will be maintained Outcome: Not Progressing Patient is NPO.

## 2016-03-07 LAB — BASIC METABOLIC PANEL
Anion gap: 11 (ref 5–15)
BUN: 91 mg/dL — ABNORMAL HIGH (ref 6–20)
CALCIUM: 7.9 mg/dL — AB (ref 8.9–10.3)
CHLORIDE: 113 mmol/L — AB (ref 101–111)
CO2: 13 mmol/L — AB (ref 22–32)
CREATININE: 4.77 mg/dL — AB (ref 0.44–1.00)
GFR calc Af Amer: 9 mL/min — ABNORMAL LOW (ref >60)
GFR calc non Af Amer: 8 mL/min — ABNORMAL LOW (ref >60)
GLUCOSE: 114 mg/dL — AB (ref 65–99)
Potassium: 2.8 mmol/L — ABNORMAL LOW (ref 3.5–5.1)
Sodium: 137 mmol/L (ref 135–145)

## 2016-03-07 LAB — GLUCOSE, CAPILLARY
GLUCOSE-CAPILLARY: 104 mg/dL — AB (ref 65–99)
GLUCOSE-CAPILLARY: 121 mg/dL — AB (ref 65–99)
Glucose-Capillary: 107 mg/dL — ABNORMAL HIGH (ref 65–99)
Glucose-Capillary: 109 mg/dL — ABNORMAL HIGH (ref 65–99)
Glucose-Capillary: 110 mg/dL — ABNORMAL HIGH (ref 65–99)
Glucose-Capillary: 127 mg/dL — ABNORMAL HIGH (ref 65–99)

## 2016-03-07 MED ORDER — POTASSIUM CHLORIDE 10 MEQ/100ML IV SOLN
10.0000 meq | INTRAVENOUS | Status: AC
  Administered 2016-03-07 (×4): 10 meq via INTRAVENOUS
  Filled 2016-03-07 (×4): qty 100

## 2016-03-07 NOTE — Plan of Care (Signed)
Problem: Education: Goal: Knowledge of [facility] Education information/materials will improve Outcome: Not Progressing Needs family assistance.  Problem: Health Behavior/Discharge Planning: Goal: Ability to manage health-related needs will improve Outcome: Not Progressing Needs family assistance.  Problem: Activity: Goal: Risk for activity intolerance will decrease Outcome: Not Progressing Patient has generalized weakness.  Problem: Fluid Volume: Goal: Ability to maintain a balanced intake and output will improve Outcome: Not Progressing Patient has poor oral intake.  Problem: Nutrition: Goal: Adequate nutrition will be maintained Outcome: Not Progressing Patient has poor oral intake.

## 2016-03-07 NOTE — Progress Notes (Signed)
Barbara [name] at [place] NAME: Barbara [name]    MRN#:  [id number]  DATE OF BIRTH:  10/02/1940  SUBJECTIVE:  Hospital Day: 3 days Barbara [name] is a 75 y.o. female presenting with Hypotension .   Overnight events: no overnight events Interval Events: weak, Wants to eat  REVIEW OF SYSTEMS:  CONSTITUTIONAL: No fever, positive fatigue or weakness.  EYES: No blurred or double vision.  EARS, NOSE, AND THROAT: No tinnitus or ear pain.  RESPIRATORY: No cough, shortness of breath, wheezing or hemoptysis.  CARDIOVASCULAR: No chest pain, orthopnea, edema.  GASTROINTESTINAL: No nausea, vomiting,positive  diarrhea denies abdominal pain.  GENITOURINARY: No dysuria, hematuria.  ENDOCRINE: No polyuria, nocturia,  HEMATOLOGY: No anemia, easy bruising or bleeding SKIN: No rash or lesion. MUSCULOSKELETAL: No joint pain or arthritis.   NEUROLOGIC: No tingling, numbness, weakness.  PSYCHIATRY: No anxiety or depression.   DRUG ALLERGIES:   Allergies  Allergen Reactions  . Percocet [Oxycodone-Acetaminophen] Hives    VITALS:  Blood pressure (!) 114/52, pulse 96, temperature 98.2 F (36.8 C), temperature source Oral, resp. rate 18, height 5' 8"  (1.727 m), weight 89 kg (196 lb 3.2 oz), SpO2 96 %.  PHYSICAL EXAMINATION:  VITAL SIGNS: Vitals:   03/06/16 2038 03/07/16 0411  BP: (!) 114/50 (!) 114/52  Pulse: (!) 108 96  Resp: 20 18  Temp: 98.4 F (36.9 C) 98.2 F (36.8 C)   GENERAL:75 y.o.female currently in no acute distress.  HEAD: Normocephalic, atraumatic.  EYES: Pupils equal, round, reactive to light. Extraocular muscles intact. No scleral icterus.  MOUTH: Moist mucosal membrane. Dentition intact. No abscess noted. Dry oral mucosa EAR, NOSE, THROAT: Clear without exudates. No external lesions.  NECK: Supple. No thyromegaly. No nodules. No JVD.  PULMONARY: Clear to ascultation, without wheeze rails or rhonci. No use of accessory muscles, Good  respiratory effort. good air entry bilaterally CHEST: Nontender to palpation.  CARDIOVASCULAR: S1 and S2. Regular rate and rhythm. No murmurs, rubs, or gallops. No edema. Pedal pulses 2+ bilaterally.  GASTROINTESTINAL: Soft, nontender, nondistended. No masses. Positive bowel sounds. No hepatosplenomegaly.  MUSCULOSKELETAL: No swelling, clubbing, or edema. Range of motion full in all extremities.  NEUROLOGIC: Cranial nerves II through XII are intact. No gross focal neurological deficits. Sensation intact. Reflexes intact.  SKIN: No ulceration, lesions, rashes, or cyanosis. Skin warm and dry. Turgor intact.  PSYCHIATRIC: Mood, affect within normal limits. Seems a little confused  alert and oriented x 3. Insight, judgment intact.      LABORATORY PANEL:   CBC  Recent Labs Lab 03/05/16 0538  WBC 30.5*  HGB 8.5*  HCT 26.9*  PLT 347   ------------------------------------------------------------------------------------------------------------------  Chemistries   Recent Labs Lab 03/04/16 1046  03/07/16 0556  NA 132*  < > 137  K 4.4  < > 2.8*  CL 106  < > 113*  CO2 10*  < > 13*  GLUCOSE 120*  < > 114*  BUN 89*  < > 91*  CREATININE 9.33*  < > 4.77*  CALCIUM 8.3*  < > 7.9*  AST 42*  --   --   ALT 38  --   --   ALKPHOS 88  --   --   BILITOT 0.8  --   --   < > = values in this interval not displayed. ------------------------------------------------------------------------------------------------------------------  Cardiac Enzymes  Recent Labs Lab 03/04/16 1147  TROPONINI 0.10*   ------------------------------------------------------------------------------------------------------------------  RADIOLOGY:  No results found.  EKG:  Orders placed or performed during the hospital encounter of 03/04/16  . ED EKG  . ED EKG    ASSESSMENT AND PLAN:   Barbara [name] is a 75 y.o. female presenting with Hypotension .  1. Acute renal failureDue to acute Evelyn necrosis:  Nephrology consultIs obtained and appreciated, felt to be due to acute tubular necrosis since patient had very poor oral intake at home. Renal ultrasound revealed left hydronephrosis, better than before. Patient was initiated on IV fluid hydration and urine output , renal function had improved. Continue current therapy and follow creatinine in the morning. Urinalysis was concerning for possible UTI, since it had numerous white blood cells, however microbiology revealed multiple species, recollection was suggested, repeat urinalysis, catheterized  2. Sepsis , which was felt to be due to  UTI, but likely due to C. difficile: The patient received 2 doses of Ceftriaxone, urine culture revealed multiple bacterial organisms, recollection was suggested. Urine analysis is being repeated, cultures may be repeated if urinalysis remains abnormal. Blood cultures are negative so far  3.  C. difficile enterocolitis- criteria for severe CDI - po vanco, diarrhea has improved  4. Acute anemia over anemia of chronic disease: Status post 2 unit packed red blood cell transfusion-stable, follow in the morning  5 . Hypotension with a history of essential Hypertension  Hold Norvasc, as blood pressure remained low  6 DVT prophylaxis with SCDs. heparin  7. Pyuria, urine cultures showed multiple bacterial organisms, recollection was suggested, repeating urinalysis and urine cultures if needed, patient received antibiotic therapy for 2 days  8. Leukocytosis, follow in the morning, likely C. difficile infection related   9. Bladder cancer, get oncologist involved for recommendations, discussed with Dr. Rogue Bussing All the records are reviewed and case discussed with Care Management/Social Workerr. Management plans discussed with the patient, family and they are in agreement.  CODE STATUS: full TOTAL TIME TAKING CARE OF THIS PATIENT: 40 minutes.   POSSIBLE D/C IN 2-3DAYS, DEPENDING ON CLINICAL  CONDITION.   Theodoro Grist M.D on 03/07/2016 at 12:46 PM  Between 7am to 6pm - Pager - [phone]  After 6pm: House Pager: - [phone]  [facility]  Office  [phone]  CC: Primary care physician; Madelyn Brunner, MD

## 2016-03-07 NOTE — Progress Notes (Signed)
[facility]  ROUNDING NOTE   Subjective:  Renal function continues to improve daily. Creatinine down to 4.77. It appears that she had at least 4 occurrences of urine output.  Objective:  Vital signs in last 24 hours:  Temp:  [97.5 F (36.4 C)-98.4 F (36.9 C)] 98.2 F (36.8 C) (08/19 0411) Pulse Rate:  [70-108] 96 (08/19 0411) Resp:  [18-20] 18 (08/19 0411) BP: (114-118)/(48-52) 114/52 (08/19 0411) SpO2:  [96 %-97 %] 96 % (08/19 0411) Weight:  [89 kg (196 lb 3.2 oz)] 89 kg (196 lb 3.2 oz) (08/19 0436)  Weight change: 0.635 kg (1 lb 6.4 oz) Filed Weights   03/05/16 0500 03/06/16 0419 03/07/16 0436  Weight: 86.2 kg (190 lb 1.6 oz) 88.4 kg (194 lb 12.8 oz) 89 kg (196 lb 3.2 oz)    Intake/Output: I/O last 3 completed shifts: In: 9935 [I.V.:1428] Out: -    Intake/Output this shift:  Total I/O In: 200 [P.O.:200] Out: -   Physical Exam: General: No acute distress  Head: Normocephalic, atraumatic. Moist oral mucosal membranes  Eyes: Anicteric  Neck: Supple, trachea midline  Lungs:  Clear to auscultation, normal effort  Heart: S1S2 no rubs  Abdomen:  Soft, nontender, bowel sounds present  Extremities: trace peripheral edema.  Neurologic: Nonfocal, moving all four extremities  Skin: No lesions  Access:     Basic Metabolic Panel:  Recent Labs Lab 03/04/16 1046 03/05/16 0538 03/06/16 0550 03/07/16 0556  NA 132* 136 138 137  K 4.4 3.8 3.5 2.8*  CL 106 110 114* 113*  CO2 10* 11* 9* 13*  GLUCOSE 120* 93 90 114*  BUN 89* 84* 94* 91*  CREATININE 9.33* 7.27* 6.24* 4.77*  CALCIUM 8.3* 8.1* 8.1* 7.9*    Liver Function Tests:  Recent Labs Lab 03/04/16 1046  AST 42*  ALT 38  ALKPHOS 88  BILITOT 0.8  PROT 7.6  ALBUMIN 2.6*   No results for input(s): LIPASE, AMYLASE in the last 168 hours. No results for input(s): AMMONIA in the last 168 hours.  CBC:  Recent Labs Lab 03/04/16 1147 03/04/16 2052 03/05/16 0538  WBC 28.7*  --  30.5*   NEUTROABS 25.7*  --   --   HGB 5.9* 7.6* 8.5*  HCT 18.8*  --  26.9*  MCV 82.1  --  84.6  PLT 285  --  347    Cardiac Enzymes:  Recent Labs Lab 03/04/16 1147  TROPONINI 0.10*    BNP: Invalid input(s): POCBNP  CBG:  Recent Labs Lab 03/06/16 1652 03/06/16 2021 03/07/16 0045 03/07/16 0347 03/07/16 0753  GLUCAP 107* 127* 127* 104* 110*    Microbiology: Results for orders placed or performed during the hospital encounter of 03/04/16  Urine culture     Status: Abnormal   Collection Time: 03/04/16 10:50 AM  Result Value Ref Range Status   Specimen Description URINE, RANDOM  Final   Special Requests Normal  Final   Culture MULTIPLE SPECIES PRESENT, SUGGEST RECOLLECTION (A)  Final   Report Status 03/06/2016 FINAL  Final  CULTURE, BLOOD (ROUTINE X 2) w Reflex to ID Panel     Status: None (Preliminary result)   Collection Time: 03/04/16  1:57 PM  Result Value Ref Range Status   Specimen Description BLOOD REJ  Final   Special Requests   Final    BOTTLES DRAWN AEROBIC AND ANAEROBIC AER 4ML ANA 3ML   Culture NO GROWTH 3 DAYS  Final   Report Status PENDING  Incomplete  Gastrointestinal Panel  by PCR , Stool     Status: None   Collection Time: 03/05/16  1:15 AM  Result Value Ref Range Status   Campylobacter species NOT DETECTED NOT DETECTED Final   Plesimonas shigelloides NOT DETECTED NOT DETECTED Final   Salmonella species NOT DETECTED NOT DETECTED Final   Yersinia enterocolitica NOT DETECTED NOT DETECTED Final   Vibrio species NOT DETECTED NOT DETECTED Final   Vibrio cholerae NOT DETECTED NOT DETECTED Final   Enteroaggregative E coli (EAEC) NOT DETECTED NOT DETECTED Final   Enteropathogenic E coli (EPEC) NOT DETECTED NOT DETECTED Final   Enterotoxigenic E coli (ETEC) NOT DETECTED NOT DETECTED Final   Shiga like toxin producing E coli (STEC) NOT DETECTED NOT DETECTED Final   E. coli O157 NOT DETECTED NOT DETECTED Final   Shigella/Enteroinvasive E coli (EIEC) NOT DETECTED  NOT DETECTED Final   Cryptosporidium NOT DETECTED NOT DETECTED Final   Cyclospora cayetanensis NOT DETECTED NOT DETECTED Final   Entamoeba histolytica NOT DETECTED NOT DETECTED Final   Giardia lamblia NOT DETECTED NOT DETECTED Final   Adenovirus F40/41 NOT DETECTED NOT DETECTED Final   Astrovirus NOT DETECTED NOT DETECTED Final   Norovirus GI/GII NOT DETECTED NOT DETECTED Final   Rotavirus A NOT DETECTED NOT DETECTED Final   Sapovirus (I, II, IV, and V) NOT DETECTED NOT DETECTED Final  C difficile quick scan w PCR reflex     Status: Abnormal   Collection Time: 03/05/16 11:20 AM  Result Value Ref Range Status   C Diff antigen POSITIVE (A) NEGATIVE Final   C Diff toxin POSITIVE (A) NEGATIVE Final   C Diff interpretation Toxin producing C. difficile detected.  Final    Comment: VALID CRITICAL RESULT CALLED TO, READ BACK BY AND VERIFIED WITH: ISABELLA ROBINSON 03/05/16 1439 KLW     Coagulation Studies: No results for input(s): LABPROT, INR in the last 72 hours.  Urinalysis:  Recent Labs  03/04/16 1050  COLORURINE YELLOW*  LABSPEC 1.025  PHURINE 5.5  GLUCOSEU NEGATIVE  HGBUR 3+*  BILIRUBINUR 1+*  KETONESUR TRACE*  PROTEINUR >300*  NITRITE NEGATIVE  LEUKOCYTESUR 3+*      Imaging: No results found.   Medications:   .  sodium bicarbonate 150 mEq in sterile water 1000 mL infusion 75 mL/hr at 03/07/16 0659   . famotidine  20 mg Oral Daily  . ferrous sulfate  325 mg Oral BID WC  . folic acid  1 mg Oral Daily  . heparin subcutaneous  5,000 Units Subcutaneous Q8H  . insulin aspart  0-9 Units Subcutaneous Q4H  . latanoprost  1 drop Both Eyes QHS  . sodium chloride flush  3 mL Intravenous Q12H  . vancomycin  500 mg Oral QID   acetaminophen **OR** acetaminophen, albuterol, bisacodyl, ondansetron **OR** ondansetron (ZOFRAN) IV, polyethylene glycol  Assessment/ Plan:  75 y.o. female with long-standing hypertension, diabetes type 2, hyperlipidemia, osteoarthritis,  obstructive sleep apnea, obesity, CKD stage IV, anemia of CKD, secondary hyperparathyroidism, left renal cyst, left sided hydronephrosis s/p stent placement with removal of nephrostomy.  1.  Acute renal failure/chronic kidney disease stage IV.  Baseline creatinine 1.9-2.0.  Renal function significantly worse At admission with creatinine of 9.  Could be related to acute tubular necrosis as she's not had very good by mouth intake at home.  Renal ultrasound shows left hydronephrosis which is less than before.   - Renal function improving daily. Creatinine down to 4.77. Continue IV fluid hydration for now. BUN remaining high at 91.  2.  Anemia chronic kidney disease.  Last hemoglobin was 8.5. Continue to monitor. Await input from hematology.  3.  Left-sided hydronephrosis.  Stable based upon most recent renal ultrasound.  4.  C. difficile colitis. Continue oral vancomycin..  5. Metabolic acidosis. Partially improved. Serum bicarbonate up to 13. Continue sodium bicarbonate drip.   LOS: 3 Brittannie Tawney 8/19/201710:28 AM

## 2016-03-08 DIAGNOSIS — R197 Diarrhea, unspecified: Secondary | ICD-10-CM

## 2016-03-08 DIAGNOSIS — R112 Nausea with vomiting, unspecified: Secondary | ICD-10-CM

## 2016-03-08 DIAGNOSIS — E872 Acidosis: Secondary | ICD-10-CM

## 2016-03-08 DIAGNOSIS — A047 Enterocolitis due to Clostridium difficile: Secondary | ICD-10-CM

## 2016-03-08 DIAGNOSIS — C679 Malignant neoplasm of bladder, unspecified: Secondary | ICD-10-CM

## 2016-03-08 DIAGNOSIS — N189 Chronic kidney disease, unspecified: Secondary | ICD-10-CM

## 2016-03-08 LAB — BASIC METABOLIC PANEL
Anion gap: 11 (ref 5–15)
BUN: 81 mg/dL — AB (ref 6–20)
CO2: 18 mmol/L — ABNORMAL LOW (ref 22–32)
CREATININE: 3.64 mg/dL — AB (ref 0.44–1.00)
Calcium: 8 mg/dL — ABNORMAL LOW (ref 8.9–10.3)
Chloride: 111 mmol/L (ref 101–111)
GFR calc Af Amer: 13 mL/min — ABNORMAL LOW (ref >60)
GFR, EST NON AFRICAN AMERICAN: 11 mL/min — AB (ref >60)
GLUCOSE: 104 mg/dL — AB (ref 65–99)
POTASSIUM: 2.7 mmol/L — AB (ref 3.5–5.1)
SODIUM: 140 mmol/L (ref 135–145)

## 2016-03-08 LAB — GLUCOSE, CAPILLARY
GLUCOSE-CAPILLARY: 111 mg/dL — AB (ref 65–99)
GLUCOSE-CAPILLARY: 140 mg/dL — AB (ref 65–99)
Glucose-Capillary: 103 mg/dL — ABNORMAL HIGH (ref 65–99)
Glucose-Capillary: 105 mg/dL — ABNORMAL HIGH (ref 65–99)
Glucose-Capillary: 124 mg/dL — ABNORMAL HIGH (ref 65–99)
Glucose-Capillary: 130 mg/dL — ABNORMAL HIGH (ref 65–99)
Glucose-Capillary: 138 mg/dL — ABNORMAL HIGH (ref 65–99)

## 2016-03-08 LAB — URINALYSIS COMPLETE WITH MICROSCOPIC (ARMC ONLY)
Bilirubin Urine: NEGATIVE
Glucose, UA: NEGATIVE mg/dL
KETONES UR: NEGATIVE mg/dL
NITRITE: NEGATIVE
PROTEIN: 30 mg/dL — AB
SPECIFIC GRAVITY, URINE: 1.009 (ref 1.005–1.030)
Squamous Epithelial / LPF: NONE SEEN
pH: 6 (ref 5.0–8.0)

## 2016-03-08 LAB — CBC
HEMATOCRIT: 25.2 % — AB (ref 35.0–47.0)
Hemoglobin: 8.4 g/dL — ABNORMAL LOW (ref 12.0–16.0)
MCH: 27 pg (ref 26.0–34.0)
MCHC: 33.3 g/dL (ref 32.0–36.0)
MCV: 81.2 fL (ref 80.0–100.0)
PLATELETS: 389 10*3/uL (ref 150–440)
RBC: 3.11 MIL/uL — ABNORMAL LOW (ref 3.80–5.20)
RDW: 16.8 % — AB (ref 11.5–14.5)
WBC: 17.6 10*3/uL — AB (ref 3.6–11.0)

## 2016-03-08 LAB — TROPONIN I: Troponin I: 0.04 ng/mL

## 2016-03-08 LAB — MAGNESIUM: Magnesium: 1.4 mg/dL — ABNORMAL LOW (ref 1.7–2.4)

## 2016-03-08 LAB — POTASSIUM: POTASSIUM: 3.2 mmol/L — AB (ref 3.5–5.1)

## 2016-03-08 MED ORDER — POTASSIUM CHLORIDE 10 MEQ/100ML IV SOLN
10.0000 meq | INTRAVENOUS | Status: AC
  Administered 2016-03-08 (×4): 10 meq via INTRAVENOUS
  Filled 2016-03-08 (×4): qty 100

## 2016-03-08 MED ORDER — MAGNESIUM SULFATE 4 GM/100ML IV SOLN
4.0000 g | Freq: Once | INTRAVENOUS | Status: AC
  Administered 2016-03-08: 4 g via INTRAVENOUS
  Filled 2016-03-08: qty 100

## 2016-03-08 MED ORDER — MAGNESIUM OXIDE 400 (241.3 MG) MG PO TABS
400.0000 mg | ORAL_TABLET | Freq: Every day | ORAL | Status: AC
  Administered 2016-03-08 – 2016-03-09 (×2): 400 mg via ORAL
  Filled 2016-03-08 (×2): qty 1

## 2016-03-08 MED ORDER — DRONABINOL 2.5 MG PO CAPS
2.5000 mg | ORAL_CAPSULE | Freq: Two times a day (BID) | ORAL | Status: DC
  Administered 2016-03-08 – 2016-03-11 (×6): 2.5 mg via ORAL
  Filled 2016-03-08 (×6): qty 1

## 2016-03-08 NOTE — Progress Notes (Signed)
[facility]  ROUNDING NOTE   Subjective:  Renal function continues to improve daily. BUN down to 81 with a creatinine of 3.6. Urine output not being recorded however she's had 6 occurrences of urination.  Objective:  Vital signs in last 24 hours:  Temp:  [98.1 F (36.7 C)-99.8 F (37.7 C)] 99.8 F (37.7 C) (08/20 0503) Pulse Rate:  [88-102] 102 (08/20 0503) Resp:  [18-20] 20 (08/20 0503) BP: (108-128)/(44-50) 128/44 (08/20 0503) SpO2:  [95 %-98 %] 95 % (08/20 0503) Weight:  [85.2 kg (187 lb 12.8 oz)] 85.2 kg (187 lb 12.8 oz) (08/20 0519)  Weight change: -3.81 kg (-8 lb 6.4 oz) Filed Weights   03/06/16 0419 03/07/16 0436 03/08/16 0519  Weight: 88.4 kg (194 lb 12.8 oz) 89 kg (196 lb 3.2 oz) 85.2 kg (187 lb 12.8 oz)    Intake/Output: I/O last 3 completed shifts: In: 3073.8 [P.O.:320; I.V.:2753.8] Out: -    Intake/Output this shift:  Total I/O In: 138.9 [I.V.:138.9] Out: 200 [Urine:200]  Physical Exam: General: No acute distress  Head: Normocephalic, atraumatic. Moist oral mucosal membranes  Eyes: Anicteric  Neck: Supple, trachea midline  Lungs:  Clear to auscultation, normal effort  Heart: S1S2 no rubs  Abdomen:  Soft, nontender, bowel sounds present  Extremities: trace peripheral edema.  Neurologic: Nonfocal, moving all four extremities  Skin: No lesions  Access:     Basic Metabolic Panel:  Recent Labs Lab 03/04/16 1046 03/05/16 0538 03/06/16 0550 03/07/16 0556 03/08/16 0638  NA 132* 136 138 137 140  K 4.4 3.8 3.5 2.8* 2.7*  CL 106 110 114* 113* 111  CO2 10* 11* 9* 13* 18*  GLUCOSE 120* 93 90 114* 104*  BUN 89* 84* 94* 91* 81*  CREATININE 9.33* 7.27* 6.24* 4.77* 3.64*  CALCIUM 8.3* 8.1* 8.1* 7.9* 8.0*  MG  --   --   --   --  1.4*    Liver Function Tests:  Recent Labs Lab 03/04/16 1046  AST 42*  ALT 38  ALKPHOS 88  BILITOT 0.8  PROT 7.6  ALBUMIN 2.6*   No results for input(s): LIPASE, AMYLASE in the last 168 hours. No  results for input(s): AMMONIA in the last 168 hours.  CBC:  Recent Labs Lab 03/04/16 1147 03/04/16 2052 03/05/16 0538 03/08/16 0638  WBC 28.7*  --  30.5* 17.6*  NEUTROABS 25.7*  --   --   --   HGB 5.9* 7.6* 8.5* 8.4*  HCT 18.8*  --  26.9* 25.2*  MCV 82.1  --  84.6 81.2  PLT 285  --  347 389    Cardiac Enzymes:  Recent Labs Lab 03/04/16 1147 03/08/16 0638  TROPONINI 0.10* 0.04*    BNP: Invalid input(s): POCBNP  CBG:  Recent Labs Lab 03/07/16 1636 03/07/16 2201 03/08/16 0022 03/08/16 0458 03/08/16 0802  GLUCAP 121* 109* 130* 103* 105*    Microbiology: Results for orders placed or performed during the hospital encounter of 03/04/16  Urine culture     Status: Abnormal   Collection Time: 03/04/16 10:50 AM  Result Value Ref Range Status   Specimen Description URINE, RANDOM  Final   Special Requests Normal  Final   Culture MULTIPLE SPECIES PRESENT, SUGGEST RECOLLECTION (A)  Final   Report Status 03/06/2016 FINAL  Final  CULTURE, BLOOD (ROUTINE X 2) w Reflex to ID Panel     Status: None (Preliminary result)   Collection Time: 03/04/16  1:57 PM  Result Value Ref Range Status  Specimen Description BLOOD REJ  Final   Special Requests   Final    BOTTLES DRAWN AEROBIC AND ANAEROBIC AER 4ML ANA 3ML   Culture NO GROWTH 3 DAYS  Final   Report Status PENDING  Incomplete  Gastrointestinal Panel by PCR , Stool     Status: None   Collection Time: 03/05/16  1:15 AM  Result Value Ref Range Status   Campylobacter species NOT DETECTED NOT DETECTED Final   Plesimonas shigelloides NOT DETECTED NOT DETECTED Final   Salmonella species NOT DETECTED NOT DETECTED Final   Yersinia enterocolitica NOT DETECTED NOT DETECTED Final   Vibrio species NOT DETECTED NOT DETECTED Final   Vibrio cholerae NOT DETECTED NOT DETECTED Final   Enteroaggregative E coli (EAEC) NOT DETECTED NOT DETECTED Final   Enteropathogenic E coli (EPEC) NOT DETECTED NOT DETECTED Final   Enterotoxigenic E coli  (ETEC) NOT DETECTED NOT DETECTED Final   Shiga like toxin producing E coli (STEC) NOT DETECTED NOT DETECTED Final   E. coli O157 NOT DETECTED NOT DETECTED Final   Shigella/Enteroinvasive E coli (EIEC) NOT DETECTED NOT DETECTED Final   Cryptosporidium NOT DETECTED NOT DETECTED Final   Cyclospora cayetanensis NOT DETECTED NOT DETECTED Final   Entamoeba histolytica NOT DETECTED NOT DETECTED Final   Giardia lamblia NOT DETECTED NOT DETECTED Final   Adenovirus F40/41 NOT DETECTED NOT DETECTED Final   Astrovirus NOT DETECTED NOT DETECTED Final   Norovirus GI/GII NOT DETECTED NOT DETECTED Final   Rotavirus A NOT DETECTED NOT DETECTED Final   Sapovirus (I, II, IV, and V) NOT DETECTED NOT DETECTED Final  C difficile quick scan w PCR reflex     Status: Abnormal   Collection Time: 03/05/16 11:20 AM  Result Value Ref Range Status   C Diff antigen POSITIVE (A) NEGATIVE Final   C Diff toxin POSITIVE (A) NEGATIVE Final   C Diff interpretation Toxin producing C. difficile detected.  Final    Comment: VALID CRITICAL RESULT CALLED TO, READ BACK BY AND VERIFIED WITH: ISABELLA ROBINSON 03/05/16 1439 KLW     Coagulation Studies: No results for input(s): LABPROT, INR in the last 72 hours.  Urinalysis:  Recent Labs  03/08/16 0841  COLORURINE YELLOW*  LABSPEC 1.009  PHURINE 6.0  GLUCOSEU NEGATIVE  HGBUR 1+*  BILIRUBINUR NEGATIVE  KETONESUR NEGATIVE  PROTEINUR 30*  NITRITE NEGATIVE  LEUKOCYTESUR 1+*      Imaging: No results found.   Medications:   .  sodium bicarbonate 150 mEq in sterile water 1000 mL infusion 75 mL/hr at 03/08/16 0526   . famotidine  20 mg Oral Daily  . ferrous sulfate  325 mg Oral BID WC  . folic acid  1 mg Oral Daily  . heparin subcutaneous  5,000 Units Subcutaneous Q8H  . insulin aspart  0-9 Units Subcutaneous Q4H  . latanoprost  1 drop Both Eyes QHS  . potassium chloride  10 mEq Intravenous Q1 Hr x 4  . sodium chloride flush  3 mL Intravenous Q12H  .  vancomycin  500 mg Oral QID   acetaminophen **OR** acetaminophen, albuterol, bisacodyl, ondansetron **OR** ondansetron (ZOFRAN) IV, polyethylene glycol  Assessment/ Plan:  75 y.o. female with long-standing hypertension, diabetes type 2, hyperlipidemia, osteoarthritis, obstructive sleep apnea, obesity, CKD stage IV, anemia of CKD, secondary hyperparathyroidism, left renal cyst, left sided hydronephrosis s/p stent placement with removal of nephrostomy.  1.  Acute renal failure/chronic kidney disease stage IV.  Baseline creatinine 1.9-2.0.  Renal function significantly worse At admission with creatinine  of 9.  Could be related to acute tubular necrosis as she's not had very good by mouth intake at home and also has C. difficile colitis.  Renal ultrasound shows left hydronephrosis which is less than before.   -Renal function continues to slowly improve. BUN down to 81 creatinine down to 3.6.  Continue IV fluid hydration.  2.  Anemia chronic kidney disease.  Hemoglobin currently 8.4. She has history of bladder cancer. Avoid Epogen for now.  3.  Left-sided hydronephrosis.  Stable based upon most recent renal ultrasound.  4.  C. difficile colitis. Patient continued on oral vancomycin   5. Metabolic acidosis. Serum bicarbonate up to 18 from 13 yesterday. Continue sodium bicarbonate drip.   LOS: 4 Maelynn Moroney 8/20/201711:20 AM

## 2016-03-08 NOTE — Progress Notes (Signed)
[place] CONSULT NOTE  Patient Care Team: Lottie Mussel III, MD as PCP - General (Internal Medicine)  CHIEF COMPLAINTS/PURPOSE OF CONSULTATION:  Bladder cancer  HISTORY OF PRESENTING ILLNESS:  Barbara [name] 75 y.o.  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 The patient had cystoscopy  that showed large nontender mass involving the left hemitrigone/left lateral wall approximately 5 cm in size; subsequently underwent TURBT.  Patient is currently admitted the hospital for- nausea vomiting diarrhea- diagnosed with C. Difficile. Patient noted to have significant worsening of the renal function/metabolic acidosis- thought to be prerenal. This improving/ continues to be in the bicarbonate drip.  Patient has been feeling weak in the last many weeks. About 2 months ago she was driving; however more recently she has been using a Programmer, multimedia. As per the husband, patient will be discharged to a rehabilitation from this hospitalization.   ROS: A complete 10 point review of system is done which is negative except mentioned above in history of present illness  MEDICAL HISTORY:  Past Medical History:  Diagnosis Date  . Anemia associated with chronic renal failure   . Arthritis   . CKD (chronic kidney disease)   . COPD (chronic obstructive pulmonary disease) ([place])   . Diabetes mellitus without complication ([place])   . Hypertension   . Sleep apnea    uses cpap  . Urinary obstruction 01/2016    SURGICAL HISTORY: Past Surgical History:  Procedure Laterality Date  . ABDOMINAL HYSTERECTOMY    . CYSTOSCOPY W/ RETROGRADES Bilateral 02/17/2016   Procedure: CYSTOSCOPY WITH RETROGRADE PYELOGRAM;  Surgeon: Hollice Espy, MD;  Location: [facility];  Service: Urology;  Laterality: Bilateral;  . CYSTOSCOPY WITH STENT PLACEMENT Left 01/21/2016   Procedure: CYSTOSCOPY WITH  double J STENT PLACEMENT;  Surgeon: Franchot Gallo, MD;  Location: [facility];  Service: Urology;  Laterality: Left;  . KIDNEY SURGERY  01/21/2016   IR NEPHROSTOMY PLACEMENT LEFT   . ROTATOR CUFF REPAIR     left  . TRANSURETHRAL RESECTION OF BLADDER TUMOR N/A 02/17/2016   Procedure: TRANSURETHRAL RESECTION OF BLADDER TUMOR (TURBT)-LARGE;  Surgeon: Hollice Espy, MD;  Location: [facility];  Service: Urology;  Laterality: N/A;  . URETEROSCOPY Left 02/17/2016   Procedure: URETEROSCOPY;  Surgeon: Hollice Espy, MD;  Location: [facility];  Service: Urology;  Laterality: Left;    SOCIAL HISTORY: Social History   Social History  . Marital status: Married    Spouse name: N/A  . Number of children: N/A  . Years of education: N/A   Occupational History  . retired    Social History Main Topics  . Smoking status: Former Smoker    Quit date: 07/20/1993  . Smokeless tobacco: Never Used     Comment: 01/22/2016   "  quit smoking many years ago "  . Alcohol use No  . Drug use: No  . Sexual activity: Not on file   Other Topics Concern  . Not on file   Social History Narrative  . No narrative on file    FAMILY HISTORY: Family History  Problem Relation Age of Onset  . Diabetes Mother   . Cancer Father   . Breast cancer Neg Hx   . Kidney disease Neg Hx     ALLERGIES:  is allergic to percocet [oxycodone-acetaminophen].  MEDICATIONS:  Current Facility-Administered Medications  Medication Dose Route Frequency Provider Last Rate Last Dose  .  acetaminophen (TYLENOL) suppository 650 mg  650 mg Rectal Q6H PRN Alexis Hugelmeyer, DO       Or  . acetaminophen (TYLENOL) tablet 650 mg  650 mg Oral Q6H PRN Alexis Hugelmeyer, DO   650 mg at 03/05/16 1612  . albuterol (PROVENTIL) (2.5 MG/3ML) 0.083% nebulizer solution 2.5 mg  2.5 mg Nebulization Q2H PRN Srikar Sudini, MD      . bisacodyl (DULCOLAX) suppository 10 mg  10 mg Rectal Daily PRN Srikar Sudini, MD      . dronabinol (MARINOL) capsule 2.5 mg   2.5 mg Oral BID AC Theodoro Grist, MD      . famotidine (PEPCID) tablet 20 mg  20 mg Oral Daily Srikar Sudini, MD   20 mg at 03/08/16 1005  . ferrous sulfate tablet 325 mg  325 mg Oral BID WC Hillary Bow, MD   325 mg at 03/08/16 0824  . folic acid (FOLVITE) tablet 1 mg  1 mg Oral Daily Srikar Sudini, MD   1 mg at 03/08/16 1005  . heparin injection 5,000 Units  5,000 Units Subcutaneous Q8H Lytle Butte, MD   5,000 Units at 03/07/16 2220  . insulin aspart (novoLOG) injection 0-9 Units  0-9 Units Subcutaneous Q4H Hillary Bow, MD   1 Units at 03/08/16 1220  . latanoprost (XALATAN) 0.005 % ophthalmic solution 1 drop  1 drop Both Eyes QHS Hillary Bow, MD   1 drop at 03/07/16 2220  . magnesium oxide (MAG-OX) tablet 400 mg  400 mg Oral Daily Theodoro Grist, MD      . magnesium sulfate IVPB 4 g 100 mL  4 g Intravenous Once Theodoro Grist, MD      . ondansetron (ZOFRAN) tablet 4 mg  4 mg Oral Q6H PRN Hillary Bow, MD       Or  . ondansetron (ZOFRAN) injection 4 mg  4 mg Intravenous Q6H PRN Srikar Sudini, MD      . polyethylene glycol (MIRALAX / GLYCOLAX) packet 17 g  17 g Oral Daily PRN Srikar Sudini, MD      . sodium bicarbonate 150 mEq in sterile water 1,000 mL infusion   Intravenous Continuous Munsoor Lateef, MD 75 mL/hr at 03/08/16 0526    . sodium chloride flush (NS) 0.9 % injection 3 mL  3 mL Intravenous Q12H Srikar Sudini, MD   3 mL at 03/08/16 1000  . vancomycin (VANCOCIN) 50 mg/mL oral solution 500 mg  500 mg Oral QID Lytle Butte, MD   500 mg at 03/08/16 1005      .  PHYSICAL EXAMINATION:  Vitals:   03/07/16 2001 03/08/16 0503  BP: (!) 119/46 (!) 128/44  Pulse: 97 (!) 102  Resp: 20 20  Temp: 98.1 F (36.7 C) 99.8 F (37.7 C)   Filed Weights   03/06/16 0419 03/07/16 0436 03/08/16 0519  Weight: 194 lb 12.8 oz (88.4 kg) 196 lb 3.2 oz (89 kg) 187 lb 12.8 oz (85.2 kg)    GENERAL: Well-nourished well-developed; Alert, no distress and comfortable.   Obese. Accompanied by her  husband. Resting in the bed. EYES: no pallor or icterus OROPHARYNX: no thrush or ulceration. NECK: supple, no masses felt LYMPH:  no palpable lymphadenopathy in the cervical, axillary or inguinal regions LUNGS: decreased breath sounds to auscultation at bases and  No wheeze or crackles HEART/CVS: regular rate & rhythm and no murmurs; No lower extremity edema ABDOMEN: abdomen soft, non-tender and normal bowel sounds Musculoskeletal:no cyanosis of digits and no clubbing  PSYCH:  alert & oriented x 3 with fluent speech NEURO: no focal motor/sensory deficits SKIN:  no rashes or significant lesions  LABORATORY DATA:  I have reviewed the data as listed Lab Results  Component Value Date   WBC 17.6 (H) 03/08/2016   HGB 8.4 (L) 03/08/2016   HCT 25.2 (L) 03/08/2016   MCV 81.2 03/08/2016   PLT 389 03/08/2016    Recent Labs  01/23/16 0350  01/25/16 0553 01/26/16 0532 01/27/16 0858  03/04/16 1046  03/06/16 0550 03/07/16 0556 03/08/16 0638  NA 140  < > 138  140 139 139  < > 132*  < > 138 137 140  K 4.7  < > 3.9  4.0 3.8 3.6  < > 4.4  < > 3.5 2.8* 2.7*  CL 115*  < > 108  108 109 108  < > 106  < > 114* 113* 111  CO2 18*  < > 21*  23 21* 19*  < > 10*  < > 9* 13* 18*  GLUCOSE 138*  < > 134*  133* 133* 143*  < > 120*  < > 90 114* 104*  BUN 29*  < > 22*  22* 27* 33*  < > 89*  < > 94* 91* 81*  CREATININE 2.66*  < > 1.92*  1.93* 1.98* 2.25*  < > 9.33*  < > 6.24* 4.77* 3.64*  CALCIUM 9.1  < > 9.7  9.8 9.8 9.5  < > 8.3*  < > 8.1* 7.9* 8.0*  GFRNONAA 16*  < > 24*  24* 23* 20*  < > 4*  < > 6* 8* 11*  GFRAA 19*  < > 28*  28* 27* 23*  < > 4*  < > 7* 9* 13*  PROT 6.9  --  8.9*  --   --   --  7.6  --   --   --   --   ALBUMIN 2.5*  < > 2.6*  2.6* 2.7* 2.7*  --  2.6*  --   --   --   --   AST 17  --  36  --   --   --  42*  --   --   --   --   ALT 15  --  36  --   --   --  38  --   --   --   --   ALKPHOS 82  --  83  --   --   --  88  --   --   --   --   BILITOT 0.6  --  0.4  --   --   --   0.8  --   --   --   --   < > = values in this interval not displayed.  RADIOGRAPHIC STUDIES: I have personally reviewed the radiological images as listed and agreed with the findings in the report. Dg Chest 2 View  Result Date: 03/04/2016 CLINICAL DATA:  Low blood pressure, weakness, question pneumonia, history COPD, hypertension, diabetes mellitus, former smoker EXAM: CHEST  2 VIEW COMPARISON:  12/17/2015 FINDINGS: Enlargement of cardiac silhouette with slight vascular congestion. Mediastinal contours vascularity normal. Atherosclerotic calcification aorta. Lungs clear. Normal pulmonary infiltrate, pleural effusion, or pneumothorax. Bones unremarkable. IMPRESSION: Enlargement of cardiac silhouette with pulmonary vascular congestion. No acute infiltrate. Aortic atherosclerosis. Electronically Signed   By: Lavonia Dana M.D.   On: 03/04/2016 12:31   US Renal  Result Date: 03/04/2016 CLINICAL DATA:  Acute renal failure.  History of bladder cancer. EXAM: RENAL / URINARY TRACT ULTRASOUND COMPLETE COMPARISON:  CT scan 01/20/2016 FINDINGS: Right Kidney: Length: 10.6 cm.  Slight increase in echogenicity. Left Kidney: Length: 10.2 cm. 5.2 cm complex cyst identified interpolar left kidney, similar to previous CT scan. Mild distention of the left intrarenal collecting system noted. Bladder: Moderately distended with circumferential bladder wall thickening. There is focal irregular bladder wall thickening in the region of the left UVJ. IMPRESSION: Mild left hydronephrosis. Focal irregular wall thickening in the region of the left UVJ, suspicious for neoplasm as seen on previous CT scan. Electronically Signed   By: Misty Stanley M.D.   On: 03/04/2016 15:25    ASSESSMENT & PLAN:   # 75 year old female patient with multiple medical problems- the recent diagnosis of muscle invasive bladder high-grade bladder cancer- currently admitted to the hospital for nausea vomiting diarrhea/acute on chronic renal failure.  #  Muscle invasive bladder cancer- status post TURBT. Patient is not a candidate for cystectomy. Evaluated by radiation oncology- agree with chemoradiation for definitive treatment of bladder cancer. With regards to systemic therapy for bladder cancer- patient is not a candidate for cisplatin given her renal insufficiency. We will consider carboplatin and Taxol along with radiation.   #  acute on chronic renal failure/metabolic acidosis- likely prerenal from poor by mouth intake/diarrhea. This is improving. Followed by nephrology. Discussed with nephrology.  # C. difficile colitis improving. On by mouth vancomycin.  # Given the recent changes clinical status- patient will be reevaluated in the cancer Center to discuss chemoradiation. We'll also speak to Dr. Donella Stade.  Thank you Dr. Ether Griffins  for allowing me to participate in the care of your pleasant patient. Please do not hesitate to contact me with questions or concerns in the interim.  All questions were answered. the above plan of care was discussed the patient's husband at the bedside. We will make follow-up appointments in the cancer center next one to 2 weeks.    Cammie Sickle, MD 03/08/2016 2:29 PM

## 2016-03-08 NOTE — Plan of Care (Signed)
Problem: Physical Regulation: Goal: Ability to maintain clinical measurements within normal limits will improve Outcome: Not Progressing Potassium of 2.7 this am.  Problem: Fluid Volume: Goal: Ability to maintain a balanced intake and output will improve Outcome: Not Progressing Pt is not eating well, did not eat breakfast this am.  Problem: Nutrition: Goal: Adequate nutrition will be maintained Outcome: Not Progressing Pt is not eating well, did not eat breakfast this am

## 2016-03-08 NOTE — Progress Notes (Signed)
Spoke to MD regarding critical potassium of 2.7. Orders received and will give IV potassium and recheck after potassium is completed. Also inquired about stat UA order, pt is incontinent but per MD do one time straight cath to obtain specimen. Ammie Dalton, RN

## 2016-03-08 NOTE — Progress Notes (Signed)
[place] [facility] [place] NAME: Barbara [name]    MRN#:  [id number]  DATE OF BIRTH:  June 03, 1941  SUBJECTIVE:  Hospital Day: 4 days Barbara [name] is a 75 y.o. female presenting with Hypotension .   Patient feels better today, denies any pain, remains somewhat somnolent and difficult to communicate to.  REVIEW OF SYSTEMS:  Unable to obtain due to relative somnolence, some slurring of speech.  DRUG ALLERGIES:   Allergies  Allergen Reactions  . Percocet [Oxycodone-Acetaminophen] Hives    VITALS:  Blood pressure (!) 128/44, pulse (!) 102, temperature 99.8 F (37.7 C), temperature source Oral, resp. rate 20, height 5' 8"  (1.727 m), weight 85.2 kg (187 lb 12.8 oz), SpO2 95 %.  PHYSICAL EXAMINATION:  VITAL SIGNS: Vitals:   03/07/16 2001 03/08/16 0503  BP: (!) 119/46 (!) 128/44  Pulse: 97 (!) 102  Resp: 20 20  Temp: 98.1 F (36.7 C) 99.8 F (37.7 C)   GENERAL:75 y.o.female currently in no acute distress. Slurring speech. Somnolent  HEAD: Normocephalic, atraumatic.  EYES: Pupils equal, round, reactive to light. Extraocular muscles intact. No scleral icterus.  MOUTH: Moist mucosal membrane. Dentition intact. No abscess noted. Dry oral mucosa EAR, NOSE, THROAT: Clear without exudates. No external lesions.  NECK: Supple. No thyromegaly. No nodules. No JVD.  PULMONARY: Clear to ascultation, without wheeze rails or rhonci. No use of accessory muscles, Good respiratory effort. good air entry bilaterally CHEST: Nontender to palpation.  CARDIOVASCULAR: S1 and S2. Regular rate and rhythm. No murmurs, rubs, or gallops. No edema. Pedal pulses 2+ bilaterally.  GASTROINTESTINAL: Soft, nontender, nondistended. No masses. Positive bowel sounds. No hepatosplenomegaly.  MUSCULOSKELETAL: No swelling, clubbing, or edema. Range of motion full in all extremities.  NEUROLOGIC: Cranial nerves II through XII are intact. No gross focal neurological deficits.  Sensation intact. Reflexes intact.  SKIN: No ulceration, lesions, rashes, or cyanosis. Skin warm and dry. Turgor intact.  PSYCHIATRIC: Mood, affect within normal limits.Somnolent, however, answer some questions, slurring speech    LABORATORY PANEL:   CBC  Recent Labs Lab 03/08/16 0638  WBC 17.6*  HGB 8.4*  HCT 25.2*  PLT 389   ------------------------------------------------------------------------------------------------------------------  Chemistries   Recent Labs Lab 03/04/16 1046  03/08/16 0638  NA 132*  < > 140  K 4.4  < > 2.7*  CL 106  < > 111  CO2 10*  < > 18*  GLUCOSE 120*  < > 104*  BUN 89*  < > 81*  CREATININE 9.33*  < > 3.64*  CALCIUM 8.3*  < > 8.0*  MG  --   --  1.4*  AST 42*  --   --   ALT 38  --   --   ALKPHOS 88  --   --   BILITOT 0.8  --   --   < > = values in this interval not displayed. ------------------------------------------------------------------------------------------------------------------  Cardiac Enzymes  Recent Labs Lab 03/08/16 0638  TROPONINI 0.04*   ------------------------------------------------------------------------------------------------------------------  RADIOLOGY:  No results found.  EKG:   Orders placed or performed during the hospital encounter of 03/04/16  . ED EKG  . ED EKG    ASSESSMENT AND PLAN:   Barbara [name] is a 75 y.o. female presenting with Hypotension .  1. Acute renal failureDue to acute tubular necrosis: Nephrology consultI obtained and appreciated, felt to be due to acute tubular necrosis since patient had poor oral intake at home, Diarrhea. Renal ultrasound revealed left hydronephrosis, better than  before. Patient was initiated on IV fluid hydration  with bicarbonate and urine output , renal function had improved. Continue current therapy and follow creatinine in the morning.  creatinine has been improving. Urinalysis was concerning for possible UTI, since it had numerous white blood cells,  however microbiology revealed multiple species, recollection was suggested, repeated urinalysis, catheterized revealed pyuria again, cultures are obtained   2. Sepsis , due to  ?UTI,  C. Difficile enterocolitis : The patient received 2 doses of Ceftriaxone intravenously while in the hospital, urine culture revealed multiple bacterial organisms, recollection waperformed since repeated Urine analysis  reveals pyuria again . Blood cultures are negative so far. The patient is to be continued on vancomycin orally for C. difficile enterocolitis, the  patient had 2 bowel movements last night, loose, mucous, green, medium amount.  3.  C. difficile enterocolitis- criteria for severe CDI - po vanco, diarrhea has not improved significantly yet   4. Acute anemia over anemia of chronic disease: Status post 2 unit packed red blood cell transfusion-stable,  stable, follow  periodically, no bleeding  5 . Hypotension with a history of essential Hypertension  Off Norvasc, blood pressure has improved.  6 DVT prophylaxis with SCDs. heparin  7. Pyuria, urine cultures showed multiple bacterial organisms, recollection was suggested, repeated urinalysis  still shows pyuria , getting urine cultures  today , patient received antibiotic therapy for 2 days with intravenous Rocephin.   8. Leukocytoss, improved, , likely C. difficile infection related   9. Bladder cancer, get oncologist involved for recommendations, discussed with Dr. Rogue Bussing  10. Failure to thrive adult, patient continues to have very poor oral intake, initiate on Marinol  11 acidosis due to renal failure, now on bicarbonate IV drip, improved   12. Hypokalemia, magnesium level was found to be low, supplement both intravenously, follow potassium and magnesium levels later today    All the records are reviewed and case discussed with Care Management/Social Workerr. Management plans discussed with the patient, family and they are in  agreement.  CODE STATUS: full TOTAL TIME TAKING CARE OF THIS PATIENT: 40 minutes.   POSSIBLE D/C IN 2-3DAYS, DEPENDING ON CLINICAL CONDITION.   Theodoro Grist M.D on 03/08/2016 at 12:32 PM  Between 7am to 6pm - Pager - [phone]  After 6pm: House Pager: - [phone]  [facility]  Office  [phone]  CC: Primary care physician; Madelyn Brunner, MD

## 2016-03-09 LAB — CBC
HCT: 24.7 % — ABNORMAL LOW (ref 35.0–47.0)
HEMOGLOBIN: 8.2 g/dL — AB (ref 12.0–16.0)
MCH: 26.9 pg (ref 26.0–34.0)
MCHC: 33.2 g/dL (ref 32.0–36.0)
MCV: 81.2 fL (ref 80.0–100.0)
Platelets: 368 10*3/uL (ref 150–440)
RBC: 3.05 MIL/uL — ABNORMAL LOW (ref 3.80–5.20)
RDW: 16.7 % — AB (ref 11.5–14.5)
WBC: 12.3 10*3/uL — AB (ref 3.6–11.0)

## 2016-03-09 LAB — BASIC METABOLIC PANEL
Anion gap: 11 (ref 5–15)
BUN: 71 mg/dL — ABNORMAL HIGH (ref 6–20)
CALCIUM: 8.1 mg/dL — AB (ref 8.9–10.3)
CO2: 21 mmol/L — ABNORMAL LOW (ref 22–32)
CREATININE: 2.53 mg/dL — AB (ref 0.44–1.00)
Chloride: 108 mmol/L (ref 101–111)
GFR calc non Af Amer: 17 mL/min — ABNORMAL LOW (ref >60)
GFR, EST AFRICAN AMERICAN: 20 mL/min — AB (ref >60)
Glucose, Bld: 105 mg/dL — ABNORMAL HIGH (ref 65–99)
Potassium: 3.3 mmol/L — ABNORMAL LOW (ref 3.5–5.1)
SODIUM: 140 mmol/L (ref 135–145)

## 2016-03-09 LAB — MAGNESIUM: MAGNESIUM: 2.2 mg/dL (ref 1.7–2.4)

## 2016-03-09 LAB — CULTURE, BLOOD (ROUTINE X 2): Culture: NO GROWTH

## 2016-03-09 LAB — URINE CULTURE: Culture: 20000 — AB

## 2016-03-09 LAB — GLUCOSE, CAPILLARY
GLUCOSE-CAPILLARY: 108 mg/dL — AB (ref 65–99)
GLUCOSE-CAPILLARY: 124 mg/dL — AB (ref 65–99)
Glucose-Capillary: 106 mg/dL — ABNORMAL HIGH (ref 65–99)
Glucose-Capillary: 117 mg/dL — ABNORMAL HIGH (ref 65–99)
Glucose-Capillary: 119 mg/dL — ABNORMAL HIGH (ref 65–99)

## 2016-03-09 LAB — TROPONIN I: TROPONIN I: 0.1 ng/mL — AB (ref <0.03)

## 2016-03-09 MED ORDER — POTASSIUM CHLORIDE CRYS ER 20 MEQ PO TBCR
40.0000 meq | EXTENDED_RELEASE_TABLET | Freq: Two times a day (BID) | ORAL | Status: AC
  Administered 2016-03-09 (×2): 40 meq via ORAL
  Filled 2016-03-09 (×2): qty 2

## 2016-03-09 NOTE — Progress Notes (Signed)
[facility] [place] NAME: Barbara [name]    MRN#:  [id number]  DATE OF BIRTH:  August 02, 1940  SUBJECTIVE:  Hospital Day: 5 days Barbara [name] is a 75 y.o. female presenting with Hypotension Patient feels good today, denies any pain, Much more comfortable today and either to engage in discussions, more alert. REVIEW OF SYSTEMS:  Unable to obtain due to relative somnolence, some slurring of speech.  DRUG ALLERGIES:   Allergies  Allergen Reactions  . Percocet [Oxycodone-Acetaminophen] Hives    VITALS:  Blood pressure (!) 109/50, pulse 89, temperature 98.2 F (36.8 C), temperature source Oral, resp. rate 20, height 5' 8"  (1.727 m), weight 86.9 kg (191 lb 8 oz), SpO2 92 %.  PHYSICAL EXAMINATION:  VITAL SIGNS: Vitals:   03/08/16 2023 03/09/16 0405  BP: (!) 122/53 (!) 109/50  Pulse: 91 89  Resp: 18 20  Temp: 97.8 F (36.6 C) 98.2 F (36.8 C)   GENERAL:75 y.o.female currently in no acute distress. Less slurry speech. Less Somnolent , , communicative, exercises in the bed HEAD: Normocephalic, atraumatic.  EYES: Pupils equal, round, reactive to light. Extraocular muscles intact. No scleral icterus.  MOUTH: Moist mucosal membrane. Dentition intact. No abscess noted. Dry oral mucosa EAR, NOSE, THROAT: Clear without exudates. No external lesions.  NECK: Supple. No thyromegaly. No nodules. No JVD.  PULMONARY: Diminished but clear to ascultation, without wheeze rails or rhonci. No use of accessory muscles, Good respiratory effort. good air entry bilaterally CHEST: Nontender to palpation.  CARDIOVASCULAR: S1 and S2. Regular rate and rhythm. No murmurs, rubs, or gallops. No edema. Pedal pulses 2+ bilaterally.  GASTROINTESTINAL: Soft, nontender, nondistended. No masses. Positive bowel sounds. No hepatosplenomegaly.  MUSCULOSKELETAL: No swelling, clubbing, or edema. Range of motion full in all extremities.  NEUROLOGIC: Cranial nerves II through XII  are intact. No gross focal neurological deficits. Sensation intact. Reflexes intact.  SKIN: No ulceration, lesions, rashes, or cyanosis. Skin warm and dry. Turgor intact.  PSYCHIATRIC: Mood, affect within normal limits.Somnolent, however, answer some questions, slurring speech    LABORATORY PANEL:   CBC  Recent Labs Lab 03/09/16 0547  WBC 12.3*  HGB 8.2*  HCT 24.7*  PLT 368   ------------------------------------------------------------------------------------------------------------------  Chemistries   Recent Labs Lab 03/04/16 1046  03/08/16 0638  03/09/16 0547  NA 132*  < > 140  --  140  K 4.4  < > 2.7*  < > 3.3*  CL 106  < > 111  --  108  CO2 10*  < > 18*  --  21*  GLUCOSE 120*  < > 104*  --  105*  BUN 89*  < > 81*  --  71*  CREATININE 9.33*  < > 3.64*  --  2.53*  CALCIUM 8.3*  < > 8.0*  --  8.1*  MG  --   --  1.4*  --   --   AST 42*  --   --   --   --   ALT 38  --   --   --   --   ALKPHOS 88  --   --   --   --   BILITOT 0.8  --   --   --   --   < > = values in this interval not displayed. ------------------------------------------------------------------------------------------------------------------  Cardiac Enzymes  Recent Labs Lab 03/09/16 0547  TROPONINI 0.10*   ------------------------------------------------------------------------------------------------------------------  RADIOLOGY:  No results found.  EKG:   Orders placed  or performed during the hospital encounter of 03/04/16  . ED EKG  . ED EKG    ASSESSMENT AND PLAN:   Barbara [name] is a 75 y.o. female presenting with Hypotension .  1. Acute renal failure due to acute tubular necrosis: Nephrology consultI obtained and appreciated, felt to be due to acute tubular necrosis since patient had poor oral intake at home, diarrhea. Renal ultrasound revealed left hydronephrosis, better than before. Patient was initiated on IV fluid hydration  with bicarbonate at 75 cc an hour and follow urine  output , renal function has been improving. Continue current therapy and follow creatinine in the morning.  creatinine has been improving. Urinalysis was concerning for possible UTI, since it had numerous white blood cells, however microbiology revealed multiple species, recollection was suggested, repeated urinalysis, catheterized revealed pyuria again, cultures are obtained , pending   2. Sepsis , due to  ?UTI,  C. Difficile enterocolitis : The patient received 2 doses of Ceftriaxone intravenously while in the hospital, urine culture revealed multiple bacterial organisms, recollection waperformed since repeated Urine analysis  reveals pyuria again . Blood cultures are negative so far. The patient is to be continued on vancomycin orally for C. difficile enterocolitis, the  patient had 3 bowel movements last night, small green loose  3.  C. difficile enterocolitis- criteria for severe CDI - po vanco, diarrhea has not improved significantly yet , get gastroenterologist involved recommendations as patient has not improved on antibiotics orally, being given for the past 3 days.   4. Acute anemia over anemia of chronic disease: Status post 2 unit packed red blood cell transfusion-stable,  stable, follow  periodically, no bleeding  5 . Hypotension with a history of essential Hypertension  Off Norvasc, blood pressure has improved. Continue IV fluids.  6 DVT prophylaxis with SCDs. heparin  7. Pyuria, urine cultures showed multiple bacterial organisms, recollection was suggested, repeated urinalysis  still shows pyuria , getting urine cultures  today , patient received antibiotic therapy for 2 days with intravenous Rocephin. Patient is relatively asymptomatic at present, however, the cell count is improving with vancomycin alone.   8. Leukocytoss, improved, not back to normal, likely C. difficile infection related   9. 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 She was evaluated by radiation oncology, recommended chemoradiation for definitive therapy of bladder cancer. The patient is not a candidate for cisplatin given the renal insufficiency, considering carboplatin and Taxol along with radiation  10. Failure to thrive adult, patient continues to have very poor oral intake, initiated Marinol, some improved  11 acidosis due to renal failure, now on bicarbonate IV drip, improved , following closely, may be able to wean off bicarbonate depending on patient's diarrhea  12. Hypokalemia, magnesium level was found to be low, supplement both intravenously, potassium remains low, supplemented orally, recheck magnesium levels today and supplement as needed   All the records are reviewed and case discussed with Care Management/Social Workerr. Management plans discussed with the patient, family and they are in agreement.  CODE STATUS: full TOTAL TIME TAKING CARE OF THIS PATIENT: 40 minutes.   POSSIBLE D/C IN 2-3DAYS, DEPENDING ON CLINICAL CONDITION.   Theodoro Grist M.D on 03/09/2016 at 12:15 PM  Between 7am to 6pm - Pager - [phone]  After 6pm: House Pager: - [phone]  [facility]  Office  [phone]  CC: Primary care physician; Madelyn Brunner, MD

## 2016-03-09 NOTE — Progress Notes (Signed)
[facility]  ROUNDING NOTE   Subjective:   Family at bedside. Patient states her diarrhea has improved.   Objective:  Vital signs in last 24 hours:  Temp:  [97.8 F (36.6 C)-98.4 F (36.9 C)] 98.2 F (36.8 C) (08/21 0405) Pulse Rate:  [89-91] 89 (08/21 0405) Resp:  [18-20] 20 (08/21 0405) BP: (109-124)/(50-56) 109/50 (08/21 0405) SpO2:  [92 %-97 %] 92 % (08/21 0405) Weight:  [86.9 kg (191 lb 8 oz)] 86.9 kg (191 lb 8 oz) (08/21 0520)  Weight change: 1.678 kg (3 lb 11.2 oz) Filed Weights   03/07/16 0436 03/08/16 0519 03/09/16 0520  Weight: 89 kg (196 lb 3.2 oz) 85.2 kg (187 lb 12.8 oz) 86.9 kg (191 lb 8 oz)    Intake/Output: I/O last 3 completed shifts: In: 2945.9 [P.O.:180; I.V.:2765.9] Out: 200 [Urine:200]   Intake/Output this shift:  No intake/output data recorded.  Physical Exam: General: No acute distress  Head: Normocephalic, atraumatic. Moist oral mucosal membranes  Eyes: Anicteric  Neck: Supple, trachea midline  Lungs:  Clear to auscultation, normal effort  Heart: S1S2 no rubs  Abdomen:  Soft, nontender, bowel sounds present  Extremities: No peripheral edema.  Neurologic: Nonfocal, moving all four extremities  Skin: No lesions  Access:     Basic Metabolic Panel:  Recent Labs Lab 03/05/16 0538 03/06/16 0550 03/07/16 0556 03/08/16 0638 03/08/16 1607 03/09/16 0547  NA 136 138 137 140  --  140  K 3.8 3.5 2.8* 2.7* 3.2* 3.3*  CL 110 114* 113* 111  --  108  CO2 11* 9* 13* 18*  --  21*  GLUCOSE 93 90 114* 104*  --  105*  BUN 84* 94* 91* 81*  --  71*  CREATININE 7.27* 6.24* 4.77* 3.64*  --  2.53*  CALCIUM 8.1* 8.1* 7.9* 8.0*  --  8.1*  MG  --   --   --  1.4*  --   --     Liver Function Tests:  Recent Labs Lab 03/04/16 1046  AST 42*  ALT 38  ALKPHOS 88  BILITOT 0.8  PROT 7.6  ALBUMIN 2.6*   No results for input(s): LIPASE, AMYLASE in the last 168 hours. No results for input(s): AMMONIA in the last 168 hours.  CBC:  Recent  Labs Lab 03/04/16 1147 03/04/16 2052 03/05/16 0538 03/08/16 0638 03/09/16 0547  WBC 28.7*  --  30.5* 17.6* 12.3*  NEUTROABS 25.7*  --   --   --   --   HGB 5.9* 7.6* 8.5* 8.4* 8.2*  HCT 18.8*  --  26.9* 25.2* 24.7*  MCV 82.1  --  84.6 81.2 81.2  PLT 285  --  347 389 368    Cardiac Enzymes:  Recent Labs Lab 03/04/16 1147 03/08/16 0638 03/09/16 0547  TROPONINI 0.10* 0.04* 0.10*    BNP: Invalid input(s): POCBNP  CBG:  Recent Labs Lab 03/08/16 1634 03/08/16 2020 03/08/16 2351 03/09/16 0402 03/09/16 0758  GLUCAP 138* 124* 111* 108* 106*    Microbiology: Results for orders placed or performed during the hospital encounter of 03/04/16  Urine culture     Status: Abnormal   Collection Time: 03/04/16 10:50 AM  Result Value Ref Range Status   Specimen Description URINE, RANDOM  Final   Special Requests Normal  Final   Culture MULTIPLE SPECIES PRESENT, SUGGEST RECOLLECTION (A)  Final   Report Status 03/06/2016 FINAL  Final  CULTURE, BLOOD (ROUTINE X 2) w Reflex to ID Panel  Status: None   Collection Time: 03/04/16  1:57 PM  Result Value Ref Range Status   Specimen Description BLOOD REJ  Final   Special Requests   Final    BOTTLES DRAWN AEROBIC AND ANAEROBIC AER 4ML ANA 3ML   Culture NO GROWTH 5 DAYS  Final   Report Status 03/09/2016 FINAL  Final  Gastrointestinal Panel by PCR , Stool     Status: None   Collection Time: 03/05/16  1:15 AM  Result Value Ref Range Status   Campylobacter species NOT DETECTED NOT DETECTED Final   Plesimonas shigelloides NOT DETECTED NOT DETECTED Final   Salmonella species NOT DETECTED NOT DETECTED Final   Yersinia enterocolitica NOT DETECTED NOT DETECTED Final   Vibrio species NOT DETECTED NOT DETECTED Final   Vibrio cholerae NOT DETECTED NOT DETECTED Final   Enteroaggregative E coli (EAEC) NOT DETECTED NOT DETECTED Final   Enteropathogenic E coli (EPEC) NOT DETECTED NOT DETECTED Final   Enterotoxigenic E coli (ETEC) NOT DETECTED  NOT DETECTED Final   Shiga like toxin producing E coli (STEC) NOT DETECTED NOT DETECTED Final   E. coli O157 NOT DETECTED NOT DETECTED Final   Shigella/Enteroinvasive E coli (EIEC) NOT DETECTED NOT DETECTED Final   Cryptosporidium NOT DETECTED NOT DETECTED Final   Cyclospora cayetanensis NOT DETECTED NOT DETECTED Final   Entamoeba histolytica NOT DETECTED NOT DETECTED Final   Giardia lamblia NOT DETECTED NOT DETECTED Final   Adenovirus F40/41 NOT DETECTED NOT DETECTED Final   Astrovirus NOT DETECTED NOT DETECTED Final   Norovirus GI/GII NOT DETECTED NOT DETECTED Final   Rotavirus A NOT DETECTED NOT DETECTED Final   Sapovirus (I, II, IV, and V) NOT DETECTED NOT DETECTED Final  C difficile quick scan w PCR reflex     Status: Abnormal   Collection Time: 03/05/16 11:20 AM  Result Value Ref Range Status   C Diff antigen POSITIVE (A) NEGATIVE Final   C Diff toxin POSITIVE (A) NEGATIVE Final   C Diff interpretation Toxin producing C. difficile detected.  Final    Comment: VALID CRITICAL RESULT CALLED TO, READ BACK BY AND VERIFIED WITH: ISABELLA ROBINSON 03/05/16 1439 KLW     Coagulation Studies: No results for input(s): LABPROT, INR in the last 72 hours.  Urinalysis:  Recent Labs  03/08/16 0841  COLORURINE YELLOW*  LABSPEC 1.009  PHURINE 6.0  GLUCOSEU NEGATIVE  HGBUR 1+*  BILIRUBINUR NEGATIVE  KETONESUR NEGATIVE  PROTEINUR 30*  NITRITE NEGATIVE  LEUKOCYTESUR 1+*      Imaging: No results found.   Medications:   .  sodium bicarbonate 150 mEq in sterile water 1000 mL infusion 75 mL/hr at 03/09/16 0624   . dronabinol  2.5 mg Oral BID AC  . famotidine  20 mg Oral Daily  . ferrous sulfate  325 mg Oral BID WC  . folic acid  1 mg Oral Daily  . heparin subcutaneous  5,000 Units Subcutaneous Q8H  . insulin aspart  0-9 Units Subcutaneous Q4H  . latanoprost  1 drop Both Eyes QHS  . potassium chloride  40 mEq Oral BID  . sodium chloride flush  3 mL Intravenous Q12H  .  vancomycin  500 mg Oral QID   acetaminophen **OR** acetaminophen, albuterol, bisacodyl, ondansetron **OR** ondansetron (ZOFRAN) IV, polyethylene glycol  Assessment/ Plan:  75 y.o.black female with long-standing hypertension, diabetes type 2, hyperlipidemia, osteoarthritis, obstructive sleep apnea  1.  Acute renal failure with metabolic acidosis on chronic kidney disease stage IV.  Baseline creatinine 1.9-2.0.  Renal function significantly worse on admission with creatinine of 9.   Acidosis exacerbated by GI losses -Renal function continues to slowly improve.  Continue IV fluid hydration: sodium bicarbonate at 12m/hr  2.  Anemia chronic kidney disease.  She has history of bladder cancer. Avoid Epogen   3.  Left-sided hydronephrosis.  Stable based upon most recent renal ultrasound. Will monitor for obstruction  4.  C. difficile colitis. Patient continued on oral vancomycin    LOS: 5[place] SPatterson8/21/201711:49 AM

## 2016-03-09 NOTE — Care Management (Signed)
Patient was evaluated by physical therapy and the recommendation for for skilled nursing placement.  Palliative care consult in progress.  Patient with invasive bladder malignancy.  Is not a candidate for chemo due to her renal function but may pursue radiation.  C Diff symptoms continue even though on oral vancomcyin.  GI consult ordered.  At present, discharge disposition is snf.

## 2016-03-09 NOTE — Plan of Care (Signed)
Problem: Fluid Volume: Goal: Ability to maintain a balanced intake and output will improve Outcome: Not Progressing Diarrhea continued despite 3 days of PO Vancomycin, GI consulted  Problem: Nutrition: Goal: Adequate nutrition will be maintained Outcome: Not Progressing Continued poor PO intake, 25% breakfast and 25% lunch eaten.

## 2016-03-09 NOTE — Care Management Important Message (Signed)
Important Message  Patient Details  Name: STEFANA LODICO MRN: [id number] Date of Birth: 1940/08/30   Medicare Important Message Given:  Yes    Katrina Stack, RN 03/09/2016, 1:21 PM

## 2016-03-09 NOTE — Consult Note (Signed)
[facility] Consultation Note  Patient ID: Barbara [name], MRN: [id number], DOB/AGE: 1941-06-11 75 y.o. Admit date: 03/04/2016   Date of Consult: 03/09/2016 Primary Physician: Madelyn Brunner, MD Primary [place]  Chief Complaint:  Chief Complaint  Patient presents with  . Hypotension   Reason for Consult: elevated troponin  HPI: 75 y.o. female with known cardiovascular disease with diffuse coronary artery disease mixed hyperlipidemia essential hypertension chronic kidney disease stage IV with complication and infection recently treated from the urinary tract infection standpoint for which the patient is slowly improving. The patient has had some shortness of breath and chest pain on occasion but nothing significant that suggested cardiovascular disease. The patient has had an EKG showing sinus tachycardia with an elevated troponin of 0.1 more consistent with demand ischemia rather than acute coronary syndrome. The patient currently does not have any significant symptoms requiring additional medication management and/or treatment. She has had heparin for further risk reduction of deep venous thrombosis and essential hypertension makes hyperlipidemia have been treated in the past with medication management but currently the patient cannot tolerate medication management. The patient does have sleep apnea diabetes well controlled at this time on appropriate management  Past Medical History:  Diagnosis Date  . Anemia associated with chronic renal failure   . Arthritis   . CKD (chronic kidney disease)   . COPD (chronic obstructive pulmonary disease) ([place])   . Diabetes mellitus without complication ([place])   . Hypertension   . Sleep apnea    uses cpap  . Urinary obstruction 01/2016      Surgical History:  Past Surgical History:  Procedure Laterality Date  . ABDOMINAL HYSTERECTOMY    . CYSTOSCOPY W/ RETROGRADES Bilateral 02/17/2016   Procedure: CYSTOSCOPY WITH  RETROGRADE PYELOGRAM;  Surgeon: Hollice Espy, MD;  Location: [facility] ORS;  Service: Urology;  Laterality: Bilateral;  . CYSTOSCOPY WITH STENT PLACEMENT Left 01/21/2016   Procedure: CYSTOSCOPY WITH double J STENT PLACEMENT;  Surgeon: Franchot Gallo, MD;  Location: [facility] OR[facility];  Service: Urology;  Laterality: Left;  . KIDNEY SURGERY  01/21/2016   IR NEPHROSTOMY PLACEMENT LEFT   . ROTATOR CUFF REPAIR     left  . TRANSURETHRAL RESECTION OF BLADDER TUMOR N/A 02/17/2016   Procedure: TRANSURETHRAL RESECTION OF BLADDER TUMOR (TURBT)-LARGE;  Surgeon: Hollice Espy, MD;  Location: [facility] OR[facility];  Service: Urology;  Laterality: N/A;  . URETEROSCOPY Left 02/17/2016   Procedure: URETEROSCOPY;  Surgeon: Hollice Espy, MD;  Location: [facility];  Service: Urology;  Laterality: Left;     Home Meds: Prior to Admission medications   Medication Sig Start Date End Date Taking? Authorizing Provider  Albuterol Sulfate (PROAIR RESPICLICK) 086 (90 Base) MCG/ACT AEPB Inhale 2 puffs into the lungs 4 (four) times daily as needed (wheezing).     Historical Provider, MD  amLODipine (NORVASC) 5 MG tablet Take 1 tablet (5 mg total) by mouth daily. 01/27/16   Lavina Hamman, MD  aspirin 81 MG tablet Take 81 mg by mouth 2 (two) times daily.    Historical Provider, MD  atorvastatin (LIPITOR) 40 MG tablet Take 40 mg by mouth daily.    Historical Provider, MD  barrier cream (NON-SPECIFIED) CREA Apply 1 application topically 3 (three) times daily as needed (every shift).    Historical Provider, MD  Cholecalciferol (VITAMIN D-3) 1000 units CAPS Take 1 capsule by mouth every morning.    Historical Provider, MD  famotidine (PEPCID) 20 MG tablet Take 1 tablet (20 mg total) by mouth  daily. 01/27/16   Lavina Hamman, MD  ferrous sulfate 325 (65 FE) MG tablet Take 1 tablet (325 mg total) by mouth 2 (two) times daily with a meal. 01/27/16   Lavina Hamman, MD  fluconazole (DIFLUCAN) 100 MG tablet Take 1 tablet daily starting 5 days prior to  surgery. Patient not taking: Reported on 02/25/2016 02/11/16   Hollice Espy, MD  folic acid (FOLVITE) 1 MG tablet Take 1 tablet (1 mg total) by mouth daily. 01/27/16   Lavina Hamman, MD  gabapentin (NEURONTIN) 100 MG capsule Take 100 mg by mouth 2 (two) times daily.    Historical Provider, MD  HYDROcodone-acetaminophen (NORCO/VICODIN) 5-325 MG tablet Take 1-2 tablets by mouth every 6 (six) hours as needed for moderate pain. Patient not taking: Reported on 02/25/2016 02/17/16   Hollice Espy, MD  insulin aspart (NOVOLOG) 100 UNIT/ML injection Inject 0-15 Units into the skin 3 (three) times daily with meals. Patient taking differently: Inject 0-15 Units into the skin 3 (three) times daily with meals. If BS <60 call MD, if BS is 70-120=0u, 121-150=2u, 151-200=3, 201-250=5u, 251-300=8u, 301-350=11u, 351-400=15u, if >450 call MD 01/27/16   Lavina Hamman, MD  Insulin Glargine (LANTUS SOLOSTAR) 100 UNIT/ML Solostar Pen INJECT 38 UNITS SUBQ EVERY MORNING AND 36 UNITS EVERY EVENING 03/02/16   Historical Provider, MD  NON FORMULARY Place 1 Units into the nose at bedtime. CPAP Time of use 2100-0600    Historical Provider, MD  Travoprost, BAK Free, (TRAVATAN) 0.004 % SOLN ophthalmic solution Place 1 drop into both eyes at bedtime.    Historical Provider, MD  vitamin B-12 1000 MCG tablet Take 1 tablet (1,000 mcg total) by mouth daily. Patient not taking: Reported on 02/07/2016 01/27/16   Lavina Hamman, MD    Inpatient Medications:  . dronabinol  2.5 mg Oral BID AC  . famotidine  20 mg Oral Daily  . ferrous sulfate  325 mg Oral BID WC  . folic acid  1 mg Oral Daily  . heparin subcutaneous  5,000 Units Subcutaneous Q8H  . insulin aspart  0-9 Units Subcutaneous Q4H  . latanoprost  1 drop Both Eyes QHS  . potassium chloride  40 mEq Oral BID  . sodium chloride flush  3 mL Intravenous Q12H  . vancomycin  500 mg Oral QID   .  sodium bicarbonate 150 mEq in sterile water 1000 mL infusion 75 mL/hr at 03/09/16 1607     Allergies:  Allergies  Allergen Reactions  . Percocet [Oxycodone-Acetaminophen] Hives    Social History   Social History  . Marital status: Married    Spouse name: N/A  . Number of children: N/A  . Years of education: N/A   Occupational History  . retired    Social History Main Topics  . Smoking status: Former Smoker    Quit date: 07/20/1993  . Smokeless tobacco: Never Used     Comment: 01/22/2016   "  quit smoking many years ago "  . Alcohol use No  . Drug use: No  . Sexual activity: Not on file   Other Topics Concern  . Not on file   Social History Narrative  . No narrative on file     Family History  Problem Relation Age of Onset  . Diabetes Mother   . Cancer Father   . Breast cancer Neg Hx   . Kidney disease Neg Hx      Review of Systems Positive forWeakness fatigue urinary issues Negative for:  General:  chills, fever, night sweats or weight changes.  Cardiovascular: PND orthopnea syncope dizziness  Dermatological skin lesions rashes Respiratory: Cough congestion Urologic: Positive for Frequent urination urination at night and hematuria Abdominal: negative for nausea, vomiting, diarrhea, bright red blood per rectum, melena, or hematemesis Neurologic: negative for visual changes, and/or hearing changes  All other systems reviewed and are otherwise negative except as noted above.  Labs:  Recent Labs  03/08/16 0638 03/09/16 0547  TROPONINI 0.04* 0.10*   Lab Results  Component Value Date   WBC 12.3 (H) 03/09/2016   HGB 8.2 (L) 03/09/2016   HCT 24.7 (L) 03/09/2016   MCV 81.2 03/09/2016   PLT 368 03/09/2016    Recent Labs Lab 03/04/16 1046  03/09/16 0547  NA 132*  < > 140  K 4.4  < > 3.3*  CL 106  < > 108  CO2 10*  < > 21*  BUN 89*  < > 71*  CREATININE 9.33*  < > 2.53*  CALCIUM 8.3*  < > 8.1*  PROT 7.6  --   --   BILITOT 0.8  --   --   ALKPHOS 88  --   --   ALT 38  --   --   AST 42*  --   --   GLUCOSE 120*  < > 105*  < > = values  in this interval not displayed. Lab Results  Component Value Date   CHOL 100 08/03/2012   HDL 31 (L) 08/03/2012   LDLCALC 22 08/03/2012   TRIG 235 (H) 08/03/2012   No results found for: DDIMER  Radiology/Studies:  Dg Chest 2 View  Result Date: 03/04/2016 CLINICAL DATA:  Low blood pressure, weakness, question pneumonia, history COPD, hypertension, diabetes mellitus, former smoker EXAM: CHEST  2 VIEW COMPARISON:  12/17/2015 FINDINGS: Enlargement of cardiac silhouette with slight vascular congestion. Mediastinal contours vascularity normal. Atherosclerotic calcification aorta. Lungs clear. Normal pulmonary infiltrate, pleural effusion, or pneumothorax. Bones unremarkable. IMPRESSION: Enlargement of cardiac silhouette with pulmonary vascular congestion. No acute infiltrate. Aortic atherosclerosis. Electronically Signed   By: Lavonia Dana M.D.   On: 03/04/2016 12:31   US Renal  Result Date: 03/04/2016 CLINICAL DATA:  Acute renal failure.  History of bladder cancer. EXAM: RENAL / URINARY TRACT ULTRASOUND COMPLETE COMPARISON:  CT scan 01/20/2016 FINDINGS: Right Kidney: Length: 10.6 cm.  Slight increase in echogenicity. Left Kidney: Length: 10.2 cm. 5.2 cm complex cyst identified interpolar left kidney, similar to previous CT scan. Mild distention of the left intrarenal collecting system noted. Bladder: Moderately distended with circumferential bladder wall thickening. There is focal irregular bladder wall thickening in the region of the left UVJ. IMPRESSION: Mild left hydronephrosis. Focal irregular wall thickening in the region of the left UVJ, suspicious for neoplasm as seen on previous CT scan. Electronically Signed   By: Misty Stanley M.D.   On: 03/04/2016 15:25    GBE:EFEOF tachycardia otherwise within normal limits  Weights: Filed Weights   03/07/16 0436 03/08/16 0519 03/09/16 0520  Weight: 196 lb 3.2 oz (89 kg) 187 lb 12.8 oz (85.2 kg) 191 lb 8 oz (86.9 kg)     Physical Exam: Blood  pressure (!) 121/56, pulse 87, temperature 98.1 F (36.7 C), temperature source Oral, resp. rate (!) 24, height 5' 8"  (1.727 m), weight 191 lb 8 oz (86.9 kg), SpO2 95 %. Body mass index is 29.12 kg/m. General: Well developed, well nourished, in no acute distress. Head eyes ears nose throat: Normocephalic, atraumatic, sclera non-icteric,  no xanthomas, nares are without discharge. No apparent thyromegaly and/or mass  Lungs: Normal respiratory effort.  no wheezes, no rales, no rhonchi.  Heart: RRR with normal S1 S2. no murmur gallop, no rub, PMI is normal size and placement, carotid upstroke normal without bruit, jugular venous pressure is normal Abdomen: Soft, non-tender, non-distended with normoactive bowel sounds. No hepatomegaly. No rebound/guarding. No obvious abdominal masses. Abdominal aorta is normal size without bruit Extremities: No edema. no cyanosis, no clubbing, no ulcers  Peripheral : 2+ bilateral upper extremity pulses, 2+ bilateral femoral pulses, 2+ bilateral dorsal pedal pulse Neuro: Alert and oriented. No facial asymmetry. No focal deficit. Moves all extremities spontaneously. Musculoskeletal: Normal muscle tone without kyphosis Psych:  Responds to questions appropriately with a normal affect.    Assessment: 75 year old female with acute infectious illness of having weakness fatigue with known coronary artery disease essential hypertension makes hyperlipidemia and sleep apnea relatively stable with an elevated troponin consistent with demand ischemia and no current evidence of acute coronary syndrome or myocardial infarction  Plan: 1. Continue supportive management for her infection and other treatments needed for improvements 2. No further cardiac intervention necessary at this time due to stability and no current symptoms of true angina   3. Premarin and/or heparin for further risk reduction of deep venous thrombosis and coronary artery disease 4. Call this further questions  but no further need in adjustments of medications until after seen at rehabilitation  Signed, Corey Skains M.D. [facility] 03/09/2016, 5:10 PM

## 2016-03-10 ENCOUNTER — Ambulatory Visit: Admission: RE | Admit: 2016-03-10 | Payer: Medicare Other | Source: Ambulatory Visit

## 2016-03-10 DIAGNOSIS — C670 Malignant neoplasm of trigone of bladder: Secondary | ICD-10-CM

## 2016-03-10 DIAGNOSIS — R531 Weakness: Secondary | ICD-10-CM

## 2016-03-10 DIAGNOSIS — Z7189 Other specified counseling: Secondary | ICD-10-CM

## 2016-03-10 DIAGNOSIS — Z789 Other specified health status: Secondary | ICD-10-CM

## 2016-03-10 LAB — GLUCOSE, CAPILLARY
GLUCOSE-CAPILLARY: 107 mg/dL — AB (ref 65–99)
GLUCOSE-CAPILLARY: 114 mg/dL — AB (ref 65–99)
GLUCOSE-CAPILLARY: 119 mg/dL — AB (ref 65–99)
Glucose-Capillary: 114 mg/dL — ABNORMAL HIGH (ref 65–99)
Glucose-Capillary: 115 mg/dL — ABNORMAL HIGH (ref 65–99)
Glucose-Capillary: 117 mg/dL — ABNORMAL HIGH (ref 65–99)
Glucose-Capillary: 121 mg/dL — ABNORMAL HIGH (ref 65–99)

## 2016-03-10 LAB — POTASSIUM: Potassium: 3.8 mmol/L (ref 3.5–5.1)

## 2016-03-10 LAB — CREATININE, SERUM
CREATININE: 2.04 mg/dL — AB (ref 0.44–1.00)
GFR calc Af Amer: 26 mL/min — ABNORMAL LOW (ref >60)
GFR, EST NON AFRICAN AMERICAN: 23 mL/min — AB (ref >60)

## 2016-03-10 LAB — TROPONIN I: Troponin I: <0.03 ng/mL (ref <0.03)

## 2016-03-10 NOTE — Progress Notes (Signed)
Physical Therapy Treatment Patient Details Name: Barbara [name] MRN: [id number] DOB: [birth year] Today's Date: 05-Apr-2016    History of Present Illness Pt admitted for acute renal failure. Pt brought to ED from pain clinic where her BP was 84/52 and HR 102. Per notes, pt had been feeling gradually weak for the past couple days. HGB at 5.6 upon admission, has increased to 8.5 as of this AM. PMH includes bladder cancer, COPD, DM, HTN, anemia, CAD, and CKD (stage 3).    PT Comments    Pt in bed initially agrees to therapy.  Encouraged pt to get out of bed to recliner but she refused repeating "I am cold" holding blankets on her.  Discussed need to remove blankets for transfer but she refused.  Pt agreed to supine AAROM as described below but she put little effort into exercises and towards end of exercises, she was not assisting at all.  Pt stated "I don't feel well" but would not give any other details.  Pt stated she did not need anything other than rest.     Follow Up Recommendations  SNF     Equipment Recommendations  None recommended by PT    Recommendations for Other Services       Precautions / Restrictions Precautions Precautions: Fall Restrictions Weight Bearing Restrictions: No    Mobility  Bed Mobility               General bed mobility comments: refused  Transfers                 General transfer comment: refused  Ambulation/Gait                 Stairs            Wheelchair Mobility    Modified Rankin (Stroke Patients Only)       Balance                                    Cognition Arousal/Alertness: Lethargic Behavior During Therapy: Flat affect Overall Cognitive Status: Within Functional Limits for tasks assessed                      Exercises General Exercises - Lower Extremity Ankle Circles/Pumps: AAROM;Both;10 reps;Supine Heel Slides: AAROM;Both;10 reps;Supine Hip ABduction/ADduction:  AAROM;Both;10 reps;Supine Straight Leg Raises: AAROM;Both;10 reps;Supine    General Comments        Pertinent Vitals/Pain Pain Assessment: No/denies pain    Home Living                      Prior Function            PT Goals (current goals can now be found in the care plan section)      Frequency  Min 2X/week    PT Plan Current plan remains appropriate    Co-evaluation             End of Session   Activity Tolerance: Patient limited by lethargy Patient left: in bed;with call bell/phone within reach;with bed alarm set     Time: 1005-1014 PT Time Calculation (min) (ACUTE ONLY): 9 min  Charges:  $Therapeutic Exercise: 8-22 mins                    G Codes:      Chesley Noon 05-Apr-2016, 10:17 AM

## 2016-03-10 NOTE — Progress Notes (Signed)
   03/10/16 1345  Clinical Encounter Type  Visited With Patient and family together  Visit Type Initial  Referral From Physician  Consult/Referral To Chaplain  Spiritual Encounters  Spiritual Needs Emotional;Prayer  Stress Factors  Patient Stress Factors Exhausted;Health changes;Loss of control;Major life changes  Family Stress Factors Major life changes  Visited w/Ms. Joeleen and Laverna Peace (husband) after visit from palliative care providers. [place] with processing the news and developing coping strategies to move forward. Provided prayer for guidance at the request of patient & spouse. Chap. Arva Chafe, Stephannie Broner, ext. [phone]

## 2016-03-10 NOTE — Consult Note (Signed)
Consultation Note Date: 03/10/2016   Patient Name: Barbara [name]  DOB: [birth year]  MRN: [id number]  Age / Sex: 75 y.o., female  PCP: Madelyn Brunner, MD Referring Physician: Theodoro Grist, MD  Reason for Consultation: Establishing goals of care and Psychosocial/spiritual support  HPI/Patient Profile: 75 y.o. female   admitted on 03/04/2016 with   Hypertension, CKD stage III, anemia of chronic disease, bladder cancer with recent left hydronephrosis status post cystoscopy and bladder resection waiting for her radiation treatment presents to the emergency room with worsening weakness. She has had chronic low back pain and was at the pain clinic to get epidural injection patient was found to be hypotensive with systolic blood pressure in the 90s and sent to the emergency room. Here patient has been found to have white count of 28,000 with hemoglobin of 5.6 down from her baseline of 8. BUN of 89 creatinine 9.93. No bloody urine on in and out catheterization. Stool negative for blood.  Patient on 02/17/2016 had 1. TURBT, 5 cm 2. Bilateralretrograde pyelogram 3. Left ureteral stent placement Left nephrostomy tube removal  Generalized failure to thrive over the past month, two different recent stays at [facility] for rehabilitation.  Limited home care support.  Clinical Assessment and Goals of Care:   This NP Wadie Lessen reviewed medical records, received report from team, assessed the patient and then meet at the patient's bedside along with her husband  to discuss diagnosis, prognosis, GOC, EOL wishes disposition and options.  A detailed discussion was had today regarding advanced directives.  Concepts specific to code status, artifical feeding and hydration, continued IV antibiotics and rehospitalization was had.  The difference between a aggressive medical intervention path  and a palliative comfort care path for  this patient at this time was had.  Values and goals of care important to patient and family were attempted to be elicited.  Conversation continued with family friend/member Benjamine Mola.  Benjamine Mola was able to speak to complex family dynamics that are affecting the overall plan if care for this patient.  Mr Neises is her second marriage of 16 yrs.  Daughters and grand-daughters are trying to "run the show" but are not available for hands on care.  Concept of Hospice and Palliative Care were discussed  Natural trajectory and expectations at EOL were discussed.  Questions and concerns addressed.   Family encouraged to call with questions or concerns.  PMT will continue to support holistically.   SUMMARY OF RECOMMENDATIONS    - Patient is open to all offered and available medical interventions to prolong life.  - Open to transition to SNF for rehab  - Hopeful to complete radiation and is open to chemotherapy  Code Status/Advance Care Planning:  Full code- encouraged to consider DNR status knowing poor outcomes in similar pateitnts   Symptom Management:   Weakness: PT/OT as toelrated and patient is open to SNF for rehab if eligible  Palliative Prophylaxis:   Aspiration, Delirium Protocol, Frequent Pain Assessment and Oral Care  Additional Recommendations (Limitations,  Scope, Preferences):  Move foreword with offered radiation  Open to transition to SNF for rehabilitation  Psycho-social/Spiritual:   Desire for further Chaplaincy support:yes  Additional Recommendations: Education on Hospice  Prognosis:   < 6 months  Discharge Planning: [facility] [place] for rehab with Palliative care service follow-up      Primary Diagnoses: Present on Admission: . ARF (acute renal failure) ([place])   I have reviewed the medical record, interviewed the patient and family, and examined the patient. The following aspects are pertinent.  Past Medical History:  Diagnosis Date  .  Anemia associated with chronic renal failure   . Arthritis   . CKD (chronic kidney disease)   . COPD (chronic obstructive pulmonary disease) ([facility])   . Diabetes mellitus without complication ([place])   . Hypertension   . Sleep apnea    uses cpap  . Urinary obstruction 01/2016   Social History   Social History  . Marital status: Married    Spouse name: N/A  . Number of children: N/A  . Years of education: N/A   Occupational History  . retired    Social History Main Topics  . Smoking status: Former Smoker    Quit date: 07/20/1993  . Smokeless tobacco: Never Used     Comment: 01/22/2016   "  quit smoking many years ago "  . Alcohol use No  . Drug use: No  . Sexual activity: Not Asked   Other Topics Concern  . None   Social History Narrative  . None   Family History  Problem Relation Age of Onset  . Diabetes Mother   . Cancer Father   . Breast cancer Neg Hx   . Kidney disease Neg Hx    Scheduled Meds: . dronabinol  2.5 mg Oral BID AC  . famotidine  20 mg Oral Daily  . ferrous sulfate  325 mg Oral BID WC  . folic acid  1 mg Oral Daily  . heparin subcutaneous  5,000 Units Subcutaneous Q8H  . insulin aspart  0-9 Units Subcutaneous Q4H  . latanoprost  1 drop Both Eyes QHS  . sodium chloride flush  3 mL Intravenous Q12H  . vancomycin  500 mg Oral QID   Continuous Infusions: .  sodium bicarbonate 150 mEq in sterile water 1000 mL infusion 75 mL/hr at 03/10/16 0145   PRN Meds:.acetaminophen **OR** acetaminophen, albuterol, bisacodyl, ondansetron **OR** ondansetron (ZOFRAN) IV, polyethylene glycol Medications Prior to Admission:  Prior to Admission medications   Medication Sig Start Date End Date Taking? Authorizing Provider  Albuterol Sulfate (PROAIR RESPICLICK) 462 (90 Base) MCG/ACT AEPB Inhale 2 puffs into the lungs 4 (four) times daily as needed (wheezing).     Historical Provider, MD  amLODipine (NORVASC) 5 MG tablet Take 1 tablet (5 mg total) by mouth daily. 01/27/16    Lavina Hamman, MD  aspirin 81 MG tablet Take 81 mg by mouth 2 (two) times daily.    Historical Provider, MD  atorvastatin (LIPITOR) 40 MG tablet Take 40 mg by mouth daily.    Historical Provider, MD  barrier cream (NON-SPECIFIED) CREA Apply 1 application topically 3 (three) times daily as needed (every shift).    Historical Provider, MD  Cholecalciferol (VITAMIN D-3) 1000 units CAPS Take 1 capsule by mouth every morning.    Historical Provider, MD  famotidine (PEPCID) 20 MG tablet Take 1 tablet (20 mg total) by mouth daily. 01/27/16   Lavina Hamman, MD  ferrous sulfate 325 (65 FE) MG  tablet Take 1 tablet (325 mg total) by mouth 2 (two) times daily with a meal. 01/27/16   Lavina Hamman, MD  fluconazole (DIFLUCAN) 100 MG tablet Take 1 tablet daily starting 5 days prior to surgery. Patient not taking: Reported on 02/25/2016 02/11/16   Hollice Espy, MD  folic acid (FOLVITE) 1 MG tablet Take 1 tablet (1 mg total) by mouth daily. 01/27/16   Lavina Hamman, MD  gabapentin (NEURONTIN) 100 MG capsule Take 100 mg by mouth 2 (two) times daily.    Historical Provider, MD  HYDROcodone-acetaminophen (NORCO/VICODIN) 5-325 MG tablet Take 1-2 tablets by mouth every 6 (six) hours as needed for moderate pain. Patient not taking: Reported on 02/25/2016 02/17/16   Hollice Espy, MD  insulin aspart (NOVOLOG) 100 UNIT/ML injection Inject 0-15 Units into the skin 3 (three) times daily with meals. Patient taking differently: Inject 0-15 Units into the skin 3 (three) times daily with meals. If BS <60 call MD, if BS is 70-120=0u, 121-150=2u, 151-200=3, 201-250=5u, 251-300=8u, 301-350=11u, 351-400=15u, if >450 call MD 01/27/16   Lavina Hamman, MD  Insulin Glargine (LANTUS SOLOSTAR) 100 UNIT/ML Solostar Pen INJECT 38 UNITS SUBQ EVERY MORNING AND 36 UNITS EVERY EVENING 03/02/16   Historical Provider, MD  NON FORMULARY Place 1 Units into the nose at bedtime. CPAP Time of use 2100-0600    Historical Provider, MD  Travoprost, BAK Free,  (TRAVATAN) 0.004 % SOLN ophthalmic solution Place 1 drop into both eyes at bedtime.    Historical Provider, MD  vitamin B-12 1000 MCG tablet Take 1 tablet (1,000 mcg total) by mouth daily. Patient not taking: Reported on 02/07/2016 01/27/16   Lavina Hamman, MD   Allergies  Allergen Reactions  . Percocet [Oxycodone-Acetaminophen] Hives   Review of Systems  Constitutional: Positive for appetite change and fatigue.  Gastrointestinal: Positive for diarrhea.  Neurological: Positive for weakness.    Physical Exam  Constitutional: She is oriented to person, place, and time. She appears well-developed. She appears ill.  HENT:  Mouth/Throat: Oropharynx is clear and moist.  Cardiovascular: Normal rate, regular rhythm and normal heart sounds.   Pulmonary/Chest: She has decreased breath sounds in the right lower field and the left lower field.  Abdominal: Soft. Bowel sounds are normal.  Neurological: She is alert and oriented to person, place, and time.    Vital Signs: BP (!) 131/57 (BP Location: Right Arm)   Pulse 83   Temp 97.6 F (36.4 C) (Oral)   Resp 18   Ht 5' 8"  (1.727 m)   Wt 83.9 kg (184 lb 14.4 oz)   SpO2 93%   BMI 28.11 kg/m  Pain Assessment: No/denies pain POSS *See Group Information*: S-Acceptable,Sleep, easy to arouse Pain Score: 0-No pain   SpO2: SpO2: 93 % O2 Device:SpO2: 93 % O2 Flow Rate: .   IO: Intake/output summary:  Intake/Output Summary (Last 24 hours) at 03/10/16 1338 Last data filed at 03/10/16 1257  Gross per 24 hour  Intake          2543.78 ml  Output                0 ml  Net          2543.78 ml    LBM: Last BM Date: 03/09/16 Baseline Weight: Weight: 81.6 kg (180 lb) Most recent weight: Weight: 83.9 kg (184 lb 14.4 oz)      Palliative Assessment/Data: 30%   Flowsheet Rows   Flowsheet Row Most Recent Value  Intake Tab  Referral Department  Hospitalist  Unit at Time of Referral  Med/Surg Unit  Palliative Care Primary Diagnosis  Cancer  Date  Notified  03/04/16  Palliative Care Type  New Palliative care  Reason for referral  Clarify Goals of Care  Date of Admission  03/04/16  # of days IP prior to Palliative referral  0  Clinical Assessment  Psychosocial & Spiritual Assessment  Palliative Care Outcomes     Discussed with Dr Ether Griffins  Time In: 1230 Time Out: 1400 Time Total: 90 min Greater than 50%  of this time was spent counseling and coordinating care related to the above assessment and plan.  Signed by: Wadie Lessen, NP   Please contact Palliative Medicine Team phone at [phone] for questions and concerns.  For individual provider: See Shea Evans

## 2016-03-10 NOTE — Consult Note (Signed)
Lucilla Lame, MD [address] [place], [place] [address] Phone: [phone] Fax : [phone]  Consultation  Referring Provider:     No ref. provider found Primary Care Physician:  Madelyn Brunner, MD Primary Gastroenterologist:           Reason for Consultation:     C. Difficile colitis not responding to medication  Date of Admission:  03/04/2016 Date of Consultation:  03/10/2016         HPI:   Barbara [name] is a 75 y.o. female who was admitted with C. difficile colitis with a C. difficile 5 days ago. The patient has been on antibiotics for this. The patient is not a good historian and cannot tell me if she was on antibiotics prior to this medication. I'm now being consult and by the hospitalist for not responding to treatment although the patient states that she is not having any diarrhea or or any abdominal pain. When speaking to the nurse that nurse also reports that the patient has not had a bowel movement since yesterday. In addition to that the patient's white cell count on admission was 30.5 and his come down to 12.3 yesterday. The patient has also not had a repeat C. difficile sent on her stools.  Past Medical History:  Diagnosis Date  . Anemia associated with chronic renal failure   . Arthritis   . CKD (chronic kidney disease)   . COPD (chronic obstructive pulmonary disease) ([place])   . Diabetes mellitus without complication ([place])   . Hypertension   . Sleep apnea    uses cpap  . Urinary obstruction 01/2016    Past Surgical History:  Procedure Laterality Date  . ABDOMINAL HYSTERECTOMY    . CYSTOSCOPY W/ RETROGRADES Bilateral 02/17/2016   Procedure: CYSTOSCOPY WITH RETROGRADE PYELOGRAM;  Surgeon: Hollice Espy, MD;  Location: [facility] ORS;  Service: Urology;  Laterality: Bilateral;  . CYSTOSCOPY WITH STENT PLACEMENT Left 01/21/2016   Procedure: CYSTOSCOPY WITH double J STENT PLACEMENT;  Surgeon: Franchot Gallo, MD;  Location: [facility] ORS;  Service: Urology;   Laterality: Left;  . KIDNEY SURGERY  01/21/2016   IR NEPHROSTOMY PLACEMENT LEFT   . ROTATOR CUFF REPAIR     left  . TRANSURETHRAL RESECTION OF BLADDER TUMOR N/A 02/17/2016   Procedure: TRANSURETHRAL RESECTION OF BLADDER TUMOR (TURBT)-LARGE;  Surgeon: Hollice Espy, MD;  Location: [facility] ORS;  Service: Urology;  Laterality: N/A;  . URETEROSCOPY Left 02/17/2016   Procedure: URETEROSCOPY;  Surgeon: Hollice Espy, MD;  Location: [facility] ORS;  Service: Urology;  Laterality: Left;    Prior to Admission medications   Medication Sig Start Date End Date Taking? Authorizing Provider  Albuterol Sulfate (PROAIR RESPICLICK) 469 (90 Base) MCG/ACT AEPB Inhale 2 puffs into the lungs 4 (four) times daily as needed (wheezing).     Historical Provider, MD  amLODipine (NORVASC) 5 MG tablet Take 1 tablet (5 mg total) by mouth daily. 01/27/16   Lavina Hamman, MD  aspirin 81 MG tablet Take 81 mg by mouth 2 (two) times daily.    Historical Provider, MD  atorvastatin (LIPITOR) 40 MG tablet Take 40 mg by mouth daily.    Historical Provider, MD  barrier cream (NON-SPECIFIED) CREA Apply 1 application topically 3 (three) times daily as needed (every shift).    Historical Provider, MD  Cholecalciferol (VITAMIN D-3) 1000 units CAPS Take 1 capsule by mouth every morning.    Historical Provider, MD  famotidine (PEPCID) 20 MG tablet Take 1  tablet (20 mg total) by mouth daily. 01/27/16   Lavina Hamman, MD  ferrous sulfate 325 (65 FE) MG tablet Take 1 tablet (325 mg total) by mouth 2 (two) times daily with a meal. 01/27/16   Lavina Hamman, MD  fluconazole (DIFLUCAN) 100 MG tablet Take 1 tablet daily starting 5 days prior to surgery. Patient not taking: Reported on 02/25/2016 02/11/16   Hollice Espy, MD  folic acid (FOLVITE) 1 MG tablet Take 1 tablet (1 mg total) by mouth daily. 01/27/16   Lavina Hamman, MD  gabapentin (NEURONTIN) 100 MG capsule Take 100 mg by mouth 2 (two) times daily.    Historical Provider, MD    HYDROcodone-acetaminophen (NORCO/VICODIN) 5-325 MG tablet Take 1-2 tablets by mouth every 6 (six) hours as needed for moderate pain. Patient not taking: Reported on 02/25/2016 02/17/16   Hollice Espy, MD  insulin aspart (NOVOLOG) 100 UNIT/ML injection Inject 0-15 Units into the skin 3 (three) times daily with meals. Patient taking differently: Inject 0-15 Units into the skin 3 (three) times daily with meals. If BS <60 call MD, if BS is 70-120=0u, 121-150=2u, 151-200=3, 201-250=5u, 251-300=8u, 301-350=11u, 351-400=15u, if >450 call MD 01/27/16   Lavina Hamman, MD  Insulin Glargine (LANTUS SOLOSTAR) 100 UNIT/ML Solostar Pen INJECT 38 UNITS SUBQ EVERY MORNING AND 36 UNITS EVERY EVENING 03/02/16   Historical Provider, MD  NON FORMULARY Place 1 Units into the nose at bedtime. CPAP Time of use 2100-0600    Historical Provider, MD  Travoprost, BAK Free, (TRAVATAN) 0.004 % SOLN ophthalmic solution Place 1 drop into both eyes at bedtime.    Historical Provider, MD  vitamin B-12 1000 MCG tablet Take 1 tablet (1,000 mcg total) by mouth daily. Patient not taking: Reported on 02/07/2016 01/27/16   Lavina Hamman, MD    Family History  Problem Relation Age of Onset  . Diabetes Mother   . Cancer Father   . Breast cancer Neg Hx   . Kidney disease Neg Hx      Social History  Substance Use Topics  . Smoking status: Former Smoker    Quit date: 07/20/1993  . Smokeless tobacco: Never Used     Comment: 01/22/2016   "  quit smoking many years ago "  . Alcohol use No    Allergies as of 03/04/2016 - Review Complete 03/04/2016  Allergen Reaction Noted  . Percocet [oxycodone-acetaminophen] Hives 04/16/2015    Review of Systems:    All systems reviewed and negative except where noted in HPI.   Physical Exam:  Vital signs in last 24 hours: Temp:  [97.6 F (36.4 C)-98.7 F (37.1 C)] 97.6 F (36.4 C) (08/22 0400) Pulse Rate:  [83-87] 83 (08/22 0400) Resp:  [18-24] 18 (08/22 0400) BP: (121-131)/(56-58)  131/57 (08/22 0400) SpO2:  [93 %-95 %] 93 % (08/22 0400) Weight:  [184 lb 14.4 oz (83.9 kg)] 184 lb 14.4 oz (83.9 kg) (08/22 0500) Last BM Date: 03/09/16 General:   Pleasant, cooperative in NAD Head:  Normocephalic and atraumatic. Eyes:   No icterus.   Conjunctiva pink. PERRLA. Ears:  Normal auditory acuity. Neck:  Supple; no masses or thyroidomegaly Lungs: Respirations even and unlabored. Lungs clear to auscultation bilaterally.   No wheezes, crackles, or rhonchi.  Heart:  Regular rate and rhythm;  Without murmur, clicks, rubs or gallops Abdomen:  Soft, nondistended, nontender. Normal bowel sounds. No appreciable masses or hepatomegaly.  No rebound or guarding.  Rectal:  Not performed. Msk:  Symmetrical without  gross deformities.    Extremities:  Without edema, cyanosis or clubbing. Neurologic:  Alert and oriented x3;  grossly normal neurologically. Skin:  Intact without significant lesions or rashes. Cervical Nodes:  No significant cervical adenopathy. Psych:  Alert and cooperative. Normal affect.  LAB RESULTS:  Recent Labs  03/08/16 0638 03/09/16 0547  WBC 17.6* 12.3*  HGB 8.4* 8.2*  HCT 25.2* 24.7*  PLT 389 368   BMET  Recent Labs  03/08/16 0638 03/08/16 1607 03/09/16 0547 03/10/16 0507  NA 140  --  140  --   K 2.7* 3.2* 3.3* 3.8  CL 111  --  108  --   CO2 18*  --  21*  --   GLUCOSE 104*  --  105*  --   BUN 81*  --  71*  --   CREATININE 3.64*  --  2.53* 2.04*  CALCIUM 8.0*  --  8.1*  --    LFT No results for input(s): PROT, ALBUMIN, AST, ALT, ALKPHOS, BILITOT, BILIDIR, IBILI in the last 72 hours. PT/INR No results for input(s): LABPROT, INR in the last 72 hours.  STUDIES: No results found.    Impression / Plan:   Barbara [name] is a 75 y.o. y/o female with an admission for C. difficile colitis with a elevated white cell count of 30 . The patient's white cell count is now down to 12.5 and the patient denies any diarrhea or abdominal pain. I'm being asked  to see the patient for nonresponse to treatment with her C. difficile colitis. It is clearly not so and a repeat C. difficile toxin should be checked on this patient. The patient seems to be appropriately responding to the treatment for her C. difficile and no further GI intervention is needed at this time. I will sign off if you have any questions please do not hesitate to call  Thank you for involving me in the care of this patient.      LOS: 6 days   Lucilla Lame, MD  03/10/2016, 12:03 PM   Note: This dictation was prepared with Dragon dictation along with smaller phrase technology. Any transcriptional errors that result from this process are unintentional.

## 2016-03-10 NOTE — Progress Notes (Signed)
[facility] NAME: Barbara [name]    MRN#:  [id number]  DATE OF BIRTH:  July 11, 1941  SUBJECTIVE:  Hospital Day: 6 days Barbara [name] is a 75 y.o. female presenting with Hypotension Patient feels good today, denies any pain,  more alert. REVIEW OF SYSTEMS:  Remains somewhat somnolent and slow to respond  DRUG ALLERGIES:   Allergies  Allergen Reactions  . Percocet [Oxycodone-Acetaminophen] Hives    VITALS:  Blood pressure (!) 131/57, pulse 83, temperature 97.6 F (36.4 C), temperature source Oral, resp. rate 18, height 5' 8"  (1.727 m), weight 83.9 kg (184 lb 14.4 oz), SpO2 93 %.  PHYSICAL EXAMINATION:  VITAL SIGNS: Vitals:   03/09/16 2010 03/10/16 0400  BP: (!) 126/58 (!) 131/57  Pulse: 85 83  Resp: 18 18  Temp: 98.7 F (37.1 C) 97.6 F (36.4 C)   GENERAL:75 y.o.female currently in no acute distress. Less slurry speech. Less Somnolent ,  More communicative, however, slow to respond to questions, able to follow commands.   HEAD: Normocephalic, atraumatic.  EYES: Pupils equal, round, reactive to light. Extraocular muscles intact. No scleral icterus.  MOUTH: Moist mucosal membrane. Dentition intact. No abscess noted. Dry oral mucosa EAR, NOSE, THROAT: Clear without exudates. No external lesions.  NECK: Supple. No thyromegaly. No nodules. No JVD.  PULMONARY: Diminished but clear to ascultation, without wheeze rails or rhonci. No use of accessory muscles, Good respiratory effort. good air entry bilaterally CHEST: Nontender to palpation.  CARDIOVASCULAR: S1 and S2. Regular rate and rhythm. No murmurs, rubs, or gallops. No edema. Pedal pulses 2+ bilaterally.  GASTROINTESTINAL: Soft, nontender, nondistended. No masses. Positive bowel sounds. No hepatosplenomegaly.  MUSCULOSKELETAL: No swelling, clubbing, or edema. Range of motion full in all extremities.  NEUROLOGIC: Cranial nerves II through XII are intact. No gross focal  neurological deficits. Sensation intact. Reflexes intact.  SKIN: No ulceration, lesions, rashes, or cyanosis. Skin warm and dry. Turgor intact.  PSYCHIATRIC: Mood, affect within normal limits.Somnolent, however, answer some questions, slurring speech    LABORATORY PANEL:   CBC  Recent Labs Lab 03/09/16 0547  WBC 12.3*  HGB 8.2*  HCT 24.7*  PLT 368   ------------------------------------------------------------------------------------------------------------------  Chemistries   Recent Labs Lab 03/04/16 1046  03/09/16 0547 03/10/16 0507  NA 132*  < > 140  --   K 4.4  < > 3.3* 3.8  CL 106  < > 108  --   CO2 10*  < > 21*  --   GLUCOSE 120*  < > 105*  --   BUN 89*  < > 71*  --   CREATININE 9.33*  < > 2.53* 2.04*  CALCIUM 8.3*  < > 8.1*  --   MG  --   < > 2.2  --   AST 42*  --   --   --   ALT 38  --   --   --   ALKPHOS 88  --   --   --   BILITOT 0.8  --   --   --   < > = values in this interval not displayed. ------------------------------------------------------------------------------------------------------------------  Cardiac Enzymes  Recent Labs Lab 03/10/16 0507  TROPONINI <0.03   ------------------------------------------------------------------------------------------------------------------  RADIOLOGY:  No results found.  EKG:   Orders placed or performed during the hospital encounter of 03/04/16  . ED EKG  . ED EKG    ASSESSMENT AND PLAN:   [name] is a 75 y.o. female presenting  with Hypotension .  1. Acute renal failure due to acute tubular necrosis: Nephrology consultation is appreciated, felt to be due to acute tubular necrosis since patient had poor oral intake at home, diarrhea. Renal ultrasound revealed left hydronephrosis, better than before. Patient was initiated on IV fluid hydration  with bicarbonate at 75 cc an hour and urine output , renal function has been improving. Continue current therapy and follow creatinine in the morning.   creatinine has been improving. Urinalysis was concerning for possible UTI, since it had numerous white blood cells, however microbiology revealed multiple species, recollection was suggested, repeated urinalysis, catheterized revealed pyuria again, cultures are obtained , showed only minimal amount of yeast. Suspected contamination from diarrheal stool.   2. Sepsis , due to   C. Difficile enterocolitis : The patient received 2 doses of Ceftriaxone intravenously while in the hospital, urine culture revealed multiple bacterial organisms, recollection was performed since repeated Urine analysis  revealed pyuria again . Blood cultures are negative , urine culture revealed yeast. The patient is to be continued on vancomycin orally for C. difficile enterocolitis, the  patient had at least 2 bowel movements last night, still continues to be loose, gastroenterology consultation is pending  3.  C. difficile enterocolitis- criteria for severe CDI - po vanco, diarrhea has improved some but did not subside completely, awaiting for gastroenterologist recommendations as patient cannot be discharged home, unless diarrheal stool subsides  4. Acute anemia over anemia of chronic disease: Status post 2 unit packed red blood cell transfusion-stable,  stable, follow  periodically, no bleeding  5 . Hypotension with a history of essential Hypertension  Off Norvasc, blood pressure has improved. Continue IV fluids.  6 DVT prophylaxis with SCDs. heparin  7. Pyuria, urine cultures showed multiple bacterial organisms, recollection was suggested, repeated urinalysis  still shows pyuria ,  urinary cultures revealed he's, no antibody therapy is needed  8. Leukocytoss, improved, not back to normal, likely C. difficile infection related   9. 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 She was evaluated by radiation oncology, recommended  chemoradiation for definitive therapy of bladder cancer. The patient is not a candidate for cisplatin given the renal insufficiency, considering carboplatin and Taxol along with radiation  10. Failure to thrive adult, patient continues to have very poor oral intake, initiated Marinol, some improved  11 acidosis due to renal failure, continue bicarbonate IV drip, improved , follow bicarbonate level in the morning, discontinue IV bicarbonate whenever patient's diarrheal stool subsides.   12. Hypokalemia, magnesium level was found to be low, supplement both intravenously, potassium has improved  supplemented orally, magnesium level yesterday was 2.2  13. Elevated troponin, patient was seen by cardiologist, Dr. Nehemiah Massed, we did not feel that patient had a true angina, recommended supportive therapy only, but no further investigations   All the records are reviewed and case discussed with Care Management/Social Workerr. Management plans discussed with the patient, family and they are in agreement.  CODE STATUS: full TOTAL TIME TAKING CARE OF THIS PATIENT: 40 minutes.   POSSIBLE D/C IN 2-3DAYS, DEPENDING ON CLINICAL CONDITION.   Theodoro Grist M.D on 03/10/2016 at 1:34 PM  Between 7am to 6pm - Pager - [phone]  After 6pm: House Pager: - [phone]  [facility]  Office  [phone]  CC: Primary care physician; Madelyn Brunner, MD

## 2016-03-10 NOTE — Progress Notes (Signed)
[facility]  ROUNDING NOTE   Subjective:   Continues to have diarrhea.  Sodium bicarbonate at 37m/hr   Objective:  Vital signs in last 24 hours:  Temp:  [97.6 F (36.4 C)-98.7 F (37.1 C)] 97.6 F (36.4 C) (08/22 0400) Pulse Rate:  [83-87] 83 (08/22 0400) Resp:  [18-24] 18 (08/22 0400) BP: (121-131)/(56-58) 131/57 (08/22 0400) SpO2:  [93 %-95 %] 93 % (08/22 0400) Weight:  [83.9 kg (184 lb 14.4 oz)] 83.9 kg (184 lb 14.4 oz) (08/22 0500)  Weight change: -2.994 kg (-6 lb 9.6 oz) Filed Weights   03/08/16 0519 03/09/16 0520 03/10/16 0500  Weight: 85.2 kg (187 lb 12.8 oz) 86.9 kg (191 lb 8 oz) 83.9 kg (184 lb 14.4 oz)    Intake/Output: I/O last 3 completed shifts: In: 2978.8 [P.O.:300; I.V.:2678.8] Out: 0    Intake/Output this shift:  Total I/O In: 99 [I.V.:99] Out: 0   Physical Exam: General: No acute distress  Head: Dry oral mucosal membranes  Eyes: Anicteric  Neck: Supple, trachea midline  Lungs:  Clear to auscultation, normal effort  Heart: S1S2 no rubs  Abdomen:  Soft, nontender, bowel sounds present  Extremities: No peripheral edema.  Neurologic: Nonfocal, moving all four extremities  Skin: No lesions  Access:     Basic Metabolic Panel:  Recent Labs Lab 03/05/16 0538 03/06/16 0550 03/07/16 0556 03/08/16 0638 03/08/16 1607 03/09/16 0547 03/10/16 0507  NA 136 138 137 140  --  140  --   K 3.8 3.5 2.8* 2.7* 3.2* 3.3* 3.8  CL 110 114* 113* 111  --  108  --   CO2 11* 9* 13* 18*  --  21*  --   GLUCOSE 93 90 114* 104*  --  105*  --   BUN 84* 94* 91* 81*  --  71*  --   CREATININE 7.27* 6.24* 4.77* 3.64*  --  2.53* 2.04*  CALCIUM 8.1* 8.1* 7.9* 8.0*  --  8.1*  --   MG  --   --   --  1.4*  --  2.2  --     Liver Function Tests:  Recent Labs Lab 03/04/16 1046  AST 42*  ALT 38  ALKPHOS 88  BILITOT 0.8  PROT 7.6  ALBUMIN 2.6*   No results for input(s): LIPASE, AMYLASE in the last 168 hours. No results for input(s): AMMONIA in the  last 168 hours.  CBC:  Recent Labs Lab 03/04/16 1147 03/04/16 2052 03/05/16 0538 03/08/16 0638 03/09/16 0547  WBC 28.7*  --  30.5* 17.6* 12.3*  NEUTROABS 25.7*  --   --   --   --   HGB 5.9* 7.6* 8.5* 8.4* 8.2*  HCT 18.8*  --  26.9* 25.2* 24.7*  MCV 82.1  --  84.6 81.2 81.2  PLT 285  --  347 389 368    Cardiac Enzymes:  Recent Labs Lab 03/04/16 1147 03/08/16 0638 03/09/16 0547 03/10/16 0507  TROPONINI 0.10* 0.04* 0.10* <0.03    BNP: Invalid input(s): POCBNP  CBG:  Recent Labs Lab 03/09/16 1650 03/09/16 2007 03/10/16 0002 03/10/16 0340 03/10/16 0744  GLUCAP 124* 119* 117* 114* 107*    Microbiology: Results for orders placed or performed during the hospital encounter of 03/04/16  Urine culture     Status: Abnormal   Collection Time: 03/04/16 10:50 AM  Result Value Ref Range Status   Specimen Description URINE, RANDOM  Final   Special Requests Normal  Final   Culture MULTIPLE SPECIES PRESENT,  SUGGEST RECOLLECTION (A)  Final   Report Status 03/06/2016 FINAL  Final  CULTURE, BLOOD (ROUTINE X 2) w Reflex to ID Panel     Status: None   Collection Time: 03/04/16  1:57 PM  Result Value Ref Range Status   Specimen Description BLOOD REJ  Final   Special Requests   Final    BOTTLES DRAWN AEROBIC AND ANAEROBIC AER 4ML ANA 3ML   Culture NO GROWTH 5 DAYS  Final   Report Status 03/09/2016 FINAL  Final  Gastrointestinal Panel by PCR , Stool     Status: None   Collection Time: 03/05/16  1:15 AM  Result Value Ref Range Status   Campylobacter species NOT DETECTED NOT DETECTED Final   Plesimonas shigelloides NOT DETECTED NOT DETECTED Final   Salmonella species NOT DETECTED NOT DETECTED Final   Yersinia enterocolitica NOT DETECTED NOT DETECTED Final   Vibrio species NOT DETECTED NOT DETECTED Final   Vibrio cholerae NOT DETECTED NOT DETECTED Final   Enteroaggregative E coli (EAEC) NOT DETECTED NOT DETECTED Final   Enteropathogenic E coli (EPEC) NOT DETECTED NOT  DETECTED Final   Enterotoxigenic E coli (ETEC) NOT DETECTED NOT DETECTED Final   Shiga like toxin producing E coli (STEC) NOT DETECTED NOT DETECTED Final   E. coli O157 NOT DETECTED NOT DETECTED Final   Shigella/Enteroinvasive E coli (EIEC) NOT DETECTED NOT DETECTED Final   Cryptosporidium NOT DETECTED NOT DETECTED Final   Cyclospora cayetanensis NOT DETECTED NOT DETECTED Final   Entamoeba histolytica NOT DETECTED NOT DETECTED Final   Giardia lamblia NOT DETECTED NOT DETECTED Final   Adenovirus F40/41 NOT DETECTED NOT DETECTED Final   Astrovirus NOT DETECTED NOT DETECTED Final   Norovirus GI/GII NOT DETECTED NOT DETECTED Final   Rotavirus A NOT DETECTED NOT DETECTED Final   Sapovirus (I, II, IV, and V) NOT DETECTED NOT DETECTED Final  C difficile quick scan w PCR reflex     Status: Abnormal   Collection Time: 03/05/16 11:20 AM  Result Value Ref Range Status   C Diff antigen POSITIVE (A) NEGATIVE Final   C Diff toxin POSITIVE (A) NEGATIVE Final   C Diff interpretation Toxin producing C. difficile detected.  Final    Comment: VALID CRITICAL RESULT CALLED TO, READ BACK BY AND VERIFIED WITH: ISABELLA ROBINSON 03/05/16 1439 KLW   Urine culture     Status: Abnormal   Collection Time: 03/08/16  8:41 AM  Result Value Ref Range Status   Specimen Description URINE, RANDOM  Final   Special Requests NONE  Final   Culture 20,000 COLONIES/mL YEAST (A)  Final   Report Status 03/09/2016 FINAL  Final    Coagulation Studies: No results for input(s): LABPROT, INR in the last 72 hours.  Urinalysis:  Recent Labs  03/08/16 0841  COLORURINE YELLOW*  LABSPEC 1.009  PHURINE 6.0  GLUCOSEU NEGATIVE  HGBUR 1+*  BILIRUBINUR NEGATIVE  KETONESUR NEGATIVE  PROTEINUR 30*  NITRITE NEGATIVE  LEUKOCYTESUR 1+*      Imaging: No results found.   Medications:   .  sodium bicarbonate 150 mEq in sterile water 1000 mL infusion 75 mL/hr at 03/10/16 0145   . dronabinol  2.5 mg Oral BID AC  .  famotidine  20 mg Oral Daily  . ferrous sulfate  325 mg Oral BID WC  . folic acid  1 mg Oral Daily  . heparin subcutaneous  5,000 Units Subcutaneous Q8H  . insulin aspart  0-9 Units Subcutaneous Q4H  . latanoprost  1  drop Both Eyes QHS  . sodium chloride flush  3 mL Intravenous Q12H  . vancomycin  500 mg Oral QID   acetaminophen **OR** acetaminophen, albuterol, bisacodyl, ondansetron **OR** ondansetron (ZOFRAN) IV, polyethylene glycol  Assessment/ Plan:  75 y.o.black female with long-standing hypertension, diabetes type 2, hyperlipidemia, osteoarthritis, obstructive sleep apnea  1.  Acute renal failure with metabolic acidosis on chronic kidney disease stage IV.  Baseline creatinine 1.9-2.0.  Renal function significantly worse on admission with creatinine of 9.   Acidosis exacerbated by GI losses - Continue IV fluid hydration: sodium bicarbonate at 16m/hr  2.  Anemia chronic kidney disease.  She has history of bladder cancer. Avoid EPO  3.  Left-sided hydronephrosis.  Stable based upon most recent renal ultrasound. Will monitor for obstruction  4.  C. difficile colitis. Patient continued on oral vancomycin  - GI consulted.    LOS: 6 Barbara [name] 8/22/201711:14 AM

## 2016-03-11 ENCOUNTER — Ambulatory Visit: Payer: Medicare Other

## 2016-03-11 DIAGNOSIS — E872 Acidosis, unspecified: Secondary | ICD-10-CM

## 2016-03-11 DIAGNOSIS — R6251 Failure to thrive (child): Secondary | ICD-10-CM

## 2016-03-11 DIAGNOSIS — I959 Hypotension, unspecified: Secondary | ICD-10-CM

## 2016-03-11 DIAGNOSIS — D649 Anemia, unspecified: Secondary | ICD-10-CM

## 2016-03-11 DIAGNOSIS — R531 Weakness: Secondary | ICD-10-CM

## 2016-03-11 DIAGNOSIS — A0472 Enterocolitis due to Clostridium difficile, not specified as recurrent: Secondary | ICD-10-CM

## 2016-03-11 LAB — CBC
HEMATOCRIT: 26.7 % — AB (ref 35.0–47.0)
HEMOGLOBIN: 8.7 g/dL — AB (ref 12.0–16.0)
MCH: 26.7 pg (ref 26.0–34.0)
MCHC: 32.6 g/dL (ref 32.0–36.0)
MCV: 81.9 fL (ref 80.0–100.0)
Platelets: 414 10*3/uL (ref 150–440)
RBC: 3.26 MIL/uL — ABNORMAL LOW (ref 3.80–5.20)
RDW: 16.2 % — ABNORMAL HIGH (ref 11.5–14.5)
WBC: 11.8 10*3/uL — ABNORMAL HIGH (ref 3.6–11.0)

## 2016-03-11 LAB — GLUCOSE, CAPILLARY
GLUCOSE-CAPILLARY: 114 mg/dL — AB (ref 65–99)
Glucose-Capillary: 104 mg/dL — ABNORMAL HIGH (ref 65–99)
Glucose-Capillary: 112 mg/dL — ABNORMAL HIGH (ref 65–99)
Glucose-Capillary: 111 mg/dL — ABNORMAL HIGH (ref 65–99)

## 2016-03-11 LAB — RENAL FUNCTION PANEL
ALBUMIN: 2.4 g/dL — AB (ref 3.5–5.0)
Anion gap: 8 (ref 5–15)
BUN: 38 mg/dL — AB (ref 6–20)
CHLORIDE: 103 mmol/L (ref 101–111)
CO2: 31 mmol/L (ref 22–32)
Calcium: 8.4 mg/dL — ABNORMAL LOW (ref 8.9–10.3)
Creatinine, Ser: 1.83 mg/dL — ABNORMAL HIGH (ref 0.44–1.00)
GFR calc Af Amer: 30 mL/min — ABNORMAL LOW (ref >60)
GFR calc non Af Amer: 26 mL/min — ABNORMAL LOW (ref >60)
GLUCOSE: 114 mg/dL — AB (ref 65–99)
PHOSPHORUS: 3.2 mg/dL (ref 2.5–4.6)
POTASSIUM: 3.2 mmol/L — AB (ref 3.5–5.1)
Sodium: 142 mmol/L (ref 135–145)

## 2016-03-11 MED ORDER — BISACODYL 10 MG RE SUPP: 10.0000 mg | Freq: Every day | RECTAL | 0 refills | Status: DC | PRN

## 2016-03-11 MED ORDER — RISAQUAD PO CAPS: 1.0000 | ORAL_CAPSULE | Freq: Every day | ORAL | 5 refills | Status: DC

## 2016-03-11 MED ORDER — DRONABINOL 2.5 MG PO CAPS: 2.5000 mg | ORAL_CAPSULE | Freq: Two times a day (BID) | ORAL | 5 refills | Status: DC

## 2016-03-11 MED ORDER — HYDROCODONE-ACETAMINOPHEN 5-325 MG PO TABS: 1.0000 | ORAL_TABLET | Freq: Four times a day (QID) | ORAL | 0 refills | Status: DC | PRN

## 2016-03-11 MED ORDER — POTASSIUM CHLORIDE CRYS ER 20 MEQ PO TBCR
40.0000 meq | EXTENDED_RELEASE_TABLET | Freq: Once | ORAL | Status: AC
  Administered 2016-03-11: 40 meq via ORAL
  Filled 2016-03-11: qty 2

## 2016-03-11 MED ORDER — GLUCERNA 1.0 CAL PO LIQD: 1.0000 | Freq: Three times a day (TID) | ORAL | 5 refills | Status: DC

## 2016-03-11 MED ORDER — VANCOMYCIN 50 MG/ML ORAL SOLUTION: 500.0000 mg | Freq: Four times a day (QID) | ORAL | 0 refills | Status: DC

## 2016-03-11 NOTE — Clinical Social Work Note (Signed)
Patient to discharge today to [facility] today. Discharge information sent and nurse to call report. CSW has contacted patient's husband and he is aware and in agreement.  Shela Leff MSW,LCSW [phone]

## 2016-03-11 NOTE — Discharge Summary (Addendum)
[facility] [place] NAME: Barbara [name]    MR#:  [id number]  DATE OF BIRTH:  01-24-1941  DATE OF ADMISSION:  03/04/2016 ADMITTING PHYSICIAN: Hillary Bow, MD  DATE OF DISCHARGE: No discharge date for patient encounter.  PRIMARY CARE PHYSICIAN: Madelyn Brunner, MD     ADMISSION DIAGNOSIS:  ARF (acute renal failure) (HCC) [N17.9] Weakness [R53.1] Hypotension, unspecified hypotension type [I95.9] Acute renal failure, unspecified acute renal failure type ([place]) [N17.9] Anemia, unspecified anemia type [D64.9]  DISCHARGE DIAGNOSIS:  Principal Problem:   ARF (acute renal failure) (HCC) Active Problems:   C. difficile diarrhea   Hypotension   Acidosis   Anemia   Bladder cancer (HCC)   Failure to thrive (child)   Generalized weakness   SECONDARY DIAGNOSIS:   Past Medical History:  Diagnosis Date  . Anemia associated with chronic renal failure   . Arthritis   . CKD (chronic kidney disease)   . COPD (chronic obstructive pulmonary disease) ([place])   . Diabetes mellitus without complication ([place])   . Hypertension   . Sleep apnea    uses cpap  . Urinary obstruction 01/2016    .pro HOSPITAL COURSE:   Patient is 75 year old female with past medical history significant for history of chronic kidney disease, arthritis, COPD, diabetes mellitus, hypertension, who presents to the hospital with diarrhea, hypotension, leukocytosis up to 20,000, severe anemia with hemoglobin level of 5.6, creatinine of 9.93. Her stool was negative for blood. She was admitted to the hospital for further evaluation and treatment, transfuse packed red blood cells, stool cultures were positive for C. difficile. She was initiated on vancomycin orally and slowly improved with conservative therapy. While in the hospital. She was followed by nephrologist, gastroenterologist, cardiologist, oncologist. The patient was seen by physical therapist and recommended skilled  nursing facility placement. Oncologist felt that patient was not a candidate for cystectomy, she was validated by radiation oncologist and recommended chemoradiation for definite treatment of bladder cancer, and aggressive systemic therapy, she was not a candidate for cisplatin given the renal insufficiency, but oncologist, will consider carboplatin and Taxol. Patient is to follow-up with oncologist as outpatient. Discussion by problem: 1. Acute renal failure due to acute tubular necrosis: Nephrology consultI obtained and appreciated, felt to be due to acute tubular necrosis since patient had poor oral intake at home, diarrhea. Renal ultrasound revealed left hydronephrosis, better than before. Patient was initiated on IV fluid hydration  with bicarbonate and urine output , renal function has improved. Now off bicarbonate. Continue supportive therapy and follow creatinine as outpatient.  Urinalysis was concerning for possible UTI, since it had numerous white blood cells, however microbiology revealed multiple species, recollection was suggested, repeated urinalysis, catheterized , revealed pyuria again, cultures are obtained , 20,000 colonies of yeast were noted, likely contamination  2. Sepsis , due to  C. Difficile enterocolitis : The patient received 2 doses of Ceftriaxone intravenously while in the hospital, urine culture revealed multiple bacterial organisms, recollection was performed since repeated Urine analysis  reveals pyuria again . Blood cultures were negative  The patient is to be continued on vancomycin orally for C. difficile enterocolitis, the  patient had 2 bowel movements last night, small brown/green loose, patient is to continue antibiotics for 10 more days to complete course.  3.  C. difficile enterocolitis- criteria for severe CDI - po vanco, diarrhea has improved  , appreciate gastroenterologist recommendations , he felt that patient is improving, recommended not  to change vancomycin  oral therapy, patient is to continue antibiotics for 10 days to complete course  4. Acute anemia over anemia of chronic disease: Status post 2 unit packed red blood cell transfusion-stable,  stable, follow  periodically, no bleeding  5 . Hypotension with a history of essential Hypertension  Off Norvasc, blood pressure has improved. Off IV fluids.  6. Pyuria, urine cultures showed multiple bacterial organisms, recollection was suggested, repeated urinalysis  still shows pyuria , getting urine cultures  today , patient received antibiotic therapy for 2 days with intravenous Rocephin. Patient is relatively asymptomatic at present, however, the cell count is improving with vancomycin alone.   7. Leukocytoss, improved, not back to normal, likely C. difficile infection related   8. 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 She was evaluated by radiation oncology, recommended chemoradiation for definitive therapy of bladder cancer. The patient is not a candidate for cisplatin given the renal insufficiency, considering carboplatin and Taxol along with radiation  9. Failure to thrive adult, patient continues to have poor oral intake, continue Marinol, some improved, follow closely, patient will be followed by palliative care as outpatient in skilled nursing facility  10 acidosis due to renal failure, now off bicarbonate IV drip, resolved  11. Hypokalemia, magnesium level was found to be low, supplement both intravenously, potassium remains low, supplemented orally, recheck magnesium, potassium levels within 1-2 days, and supplement as needed  DISCHARGE CONDITIONS:   Stable  CONSULTS OBTAINED:  Treatment Team:  Anthonette Legato, MD Noreene Filbert, MD Cammie Sickle, MD Corey Skains, MD  DRUG ALLERGIES:   Allergies  Allergen Reactions  . Percocet [Oxycodone-Acetaminophen] Hives     DISCHARGE MEDICATIONS:   Current Discharge Medication List    START taking these medications   Details  acidophilus (RISAQUAD) CAPS capsule Take 1 capsule by mouth daily. Qty: 30 capsule, Refills: 5    bisacodyl (DULCOLAX) 10 MG suppository Place 1 suppository (10 mg total) rectally daily as needed for moderate constipation. Qty: 12 suppository, Refills: 0    dronabinol (MARINOL) 2.5 MG capsule Take 1 capsule (2.5 mg total) by mouth 2 (two) times daily before lunch and supper. Qty: 60 capsule, Refills: 5    Nutritional Supplements (GLUCERNA 1.0 CAL) LIQD Take 1 Can by mouth 3 (three) times daily. Qty: 90 Bottle, Refills: 5    vancomycin (VANCOCIN) 50 mg/mL oral solution Take 10 mLs (500 mg total) by mouth 4 (four) times daily. Qty: 400 mL, Refills: 0      CONTINUE these medications which have CHANGED   Details  HYDROcodone-acetaminophen (NORCO/VICODIN) 5-325 MG tablet Take 1-2 tablets by mouth every 6 (six) hours as needed for moderate pain. Qty: 15 tablet, Refills: 0      CONTINUE these medications which have NOT CHANGED   Details  Albuterol Sulfate (PROAIR RESPICLICK) 790 (90 Base) MCG/ACT AEPB Inhale 2 puffs into the lungs 4 (four) times daily as needed (wheezing).     aspirin 81 MG tablet Take 81 mg by mouth 2 (two) times daily.    atorvastatin (LIPITOR) 40 MG tablet Take 40 mg by mouth daily.    barrier cream (NON-SPECIFIED) CREA Apply 1 application topically 3 (three) times daily as needed (every shift).    Cholecalciferol (VITAMIN D-3) 1000 units CAPS Take 1 capsule by mouth every morning.    famotidine (PEPCID) 20 MG tablet Take 1 tablet (20 mg total) by mouth daily. Qty: 30 tablet, Refills:  0    ferrous sulfate 325 (65 FE) MG tablet Take 1 tablet (325 mg total) by mouth 2 (two) times daily with a meal. Qty: 60 tablet, Refills: 0    folic acid (FOLVITE) 1 MG tablet Take 1 tablet (1 mg total) by mouth daily. Qty: 30 tablet, Refills: 0    gabapentin  (NEURONTIN) 100 MG capsule Take 100 mg by mouth 2 (two) times daily.    insulin aspart (NOVOLOG) 100 UNIT/ML injection Inject 0-15 Units into the skin 3 (three) times daily with meals. Qty: 10 mL, Refills: 0    NON FORMULARY Place 1 Units into the nose at bedtime. CPAP Time of use 2100-0600    Travoprost, BAK Free, (TRAVATAN) 0.004 % SOLN ophthalmic solution Place 1 drop into both eyes at bedtime.    vitamin B-12 1000 MCG tablet Take 1 tablet (1,000 mcg total) by mouth daily. Qty: 30 tablet, Refills: 0      STOP taking these medications     amLODipine (NORVASC) 5 MG tablet      fluconazole (DIFLUCAN) 100 MG tablet      Insulin Glargine (LANTUS SOLOSTAR) 100 UNIT/ML Solostar Pen          DISCHARGE INSTRUCTIONS:    Patient is to follow-up with primary care physician, oncologist as outpatient  If you experience worsening of your admission symptoms, develop shortness of breath, life threatening emergency, suicidal or homicidal thoughts you must seek medical attention immediately by calling 911 or calling your MD immediately  if symptoms less severe.  You Must read complete instructions/literature along with all the possible adverse reactions/side effects for all the Medicines you take and that have been prescribed to you. Take any new Medicines after you have completely understood and accept all the possible adverse reactions/side effects.   Please note  You were cared for by a hospitalist during your hospital stay. If you have any questions about your discharge medications or the care you received while you were in the hospital after you are discharged, you can call the unit and asked to speak with the hospitalist on call if the hospitalist that took care of you is not available. Once you are discharged, your primary care physician will handle any further medical issues. Please note that NO REFILLS for any discharge medications will be authorized once you are discharged, as it is  imperative that you return to your primary care physician (or establish a relationship with a primary care physician if you do not have one) for your aftercare needs so that they can reassess your need for medications and monitor your lab values.    Today   CHIEF COMPLAINT:   Chief Complaint  Patient presents with  . Hypotension    HISTORY OF PRESENT ILLNESS:  Barbara [name]  is a 75 y.o. female with a known history of chronic kidney disease, arthritis, COPD, diabetes mellitus, hypertension, who presents to the hospital with diarrhea, hypotension, leukocytosis up to 20,000, severe anemia with hemoglobin level of 5.6, creatinine of 9.93. Her stool was negative for blood. She was admitted to the hospital for further evaluation and treatment, transfuse packed red blood cells, stool cultures were positive for C. difficile. She was initiated on vancomycin orally and slowly improved with conservative therapy. While in the hospital. She was followed by nephrologist, gastroenterologist, cardiologist, oncologist. The patient was seen by physical therapist and recommended skilled nursing facility placement. Oncologist felt that patient was not a candidate for cystectomy, she was validated by radiation oncologist  and recommended chemoradiation for definite treatment of bladder cancer, and aggressive systemic therapy, she was not a candidate for cisplatin given the renal insufficiency, but oncologist, will consider carboplatin and Taxol. Patient is to follow-up with oncologist as outpatient. Discussion by problem: 1. Acute renal failure due to acute tubular necrosis: Nephrology consultI obtained and appreciated, felt to be due to acute tubular necrosis since patient had poor oral intake at home, diarrhea. Renal ultrasound revealed left hydronephrosis, better than before. Patient was initiated on IV fluid hydration  with bicarbonate and urine output , renal function has improved. Now off bicarbonate. Continue  supportive therapy and follow creatinine as outpatient.  Urinalysis was concerning for possible UTI, since it had numerous white blood cells, however microbiology revealed multiple species, recollection was suggested, repeated urinalysis, catheterized , revealed pyuria again, cultures are obtained , 20,000 colonies of yeast were noted, likely contamination  2. Sepsis , due to  C. Difficile enterocolitis : The patient received 2 doses of Ceftriaxone intravenously while in the hospital, urine culture revealed multiple bacterial organisms, recollection was performed since repeated Urine analysis  reveals pyuria again . Blood cultures were negative  The patient is to be continued on vancomycin orally for C. difficile enterocolitis, the  patient had 2 bowel movements last night, small brown/green loose, patient is to continue antibiotics for 10 more days to complete course.  3.  C. difficile enterocolitis- criteria for severe CDI - po vanco, diarrhea has improved  , appreciate gastroenterologist recommendations , he felt that patient is improving, recommended not to change vancomycin oral therapy, patient is to continue antibiotics for 10 days to complete course  4. Acute anemia over anemia of chronic disease: Status post 2 unit packed red blood cell transfusion-stable,  stable, follow  periodically, no bleeding  5 . Hypotension with a history of essential Hypertension  Off Norvasc, blood pressure has improved. Off IV fluids.  6. Pyuria, urine cultures showed multiple bacterial organisms, recollection was suggested, repeated urinalysis  still shows pyuria , getting urine cultures  today , patient received antibiotic therapy for 2 days with intravenous Rocephin. Patient is relatively asymptomatic at present, however, the cell count is improving with vancomycin alone.   7. Leukocytoss, improved, not back to normal, likely C. difficile infection related   8. Muscle invasive bladder cancer status post  TURBT, appreciate oncologist recommendations, patient is not a candidate for cystectomy. She was evaluated by radiation oncology, recommended chemoradiation for definitive therapy of bladder cancer. The patient is not a candidate for cisplatin given the renal insufficiency, considering carboplatin and Taxol along with radiation  9. Failure to thrive adult, patient continues to have poor oral intake, continue Marinol, some improved, follow closely, patient will be followed by palliative care as outpatient in skilled nursing facility  10 acidosis due to renal failure, now off bicarbonate IV drip, resolved  11. Hypokalemia, magnesium level was found to be low, supplement both intravenously, potassium remains low, supplemented orally, recheck magnesium, potassium levels within 1-2 days, and supplement as needed    VITAL SIGNS:  Blood pressure (!) 131/57, pulse 85, temperature 97.7 F (36.5 C), temperature source Oral, resp. rate 16, height 5' 8"  (1.727 m), weight 83 kg (183 lb), SpO2 96 %.  I/O:    Intake/Output Summary (Last 24 hours) at 03/11/16 1431 Last data filed at 03/11/16 0659  Gross per 24 hour  Intake           1427.5 ml  Output  0 ml  Net           1427.5 ml    PHYSICAL EXAMINATION:  GENERAL:  75 y.o.-year-old patient lying in the bed with no acute distress.  EYES: Pupils equal, round, reactive to light and accommodation. No scleral icterus. Extraocular muscles intact.  HEENT: Head atraumatic, normocephalic. Oropharynx and nasopharynx clear.  NECK:  Supple, no jugular venous distention. No thyroid enlargement, no tenderness.  LUNGS: Normal breath sounds bilaterally, no wheezing, rales,rhonchi or crepitation. No use of accessory muscles of respiration.  CARDIOVASCULAR: S1, S2 normal. No murmurs, rubs, or gallops.  ABDOMEN: Soft, non-tender, non-distended. Bowel sounds present. No organomegaly or mass.  EXTREMITIES: No pedal edema, cyanosis, or clubbing.   NEUROLOGIC: Cranial nerves II through XII are intact. Muscle strength 5/5 in all extremities. Sensation intact. Gait not checked.  PSYCHIATRIC: The patient is alert and oriented x 3.  SKIN: No obvious rash, lesion, or ulcer.   DATA REVIEW:   CBC  Recent Labs Lab 03/11/16 0458  WBC 11.8*  HGB 8.7*  HCT 26.7*  PLT 414    Chemistries   Recent Labs Lab 03/09/16 0547  03/11/16 0458  NA 140  --  142  K 3.3*  < > 3.2*  CL 108  --  103  CO2 21*  --  31  GLUCOSE 105*  --  114*  BUN 71*  --  38*  CREATININE 2.53*  < > 1.83*  CALCIUM 8.1*  --  8.4*  MG 2.2  --   --   < > = values in this interval not displayed.  Cardiac Enzymes  Recent Labs Lab 03/10/16 0507  TROPONINI <0.03    Microbiology Results  Results for orders placed or performed during the hospital encounter of 03/04/16  Urine culture     Status: Abnormal   Collection Time: 03/04/16 10:50 AM  Result Value Ref Range Status   Specimen Description URINE, RANDOM  Final   Special Requests Normal  Final   Culture MULTIPLE SPECIES PRESENT, SUGGEST RECOLLECTION (A)  Final   Report Status 03/06/2016 FINAL  Final  CULTURE, BLOOD (ROUTINE X 2) w Reflex to ID Panel     Status: None   Collection Time: 03/04/16  1:57 PM  Result Value Ref Range Status   Specimen Description BLOOD REJ  Final   Special Requests   Final    BOTTLES DRAWN AEROBIC AND ANAEROBIC AER 4ML ANA 3ML   Culture NO GROWTH 5 DAYS  Final   Report Status 03/09/2016 FINAL  Final  Gastrointestinal Panel by PCR , Stool     Status: None   Collection Time: 03/05/16  1:15 AM  Result Value Ref Range Status   Campylobacter species NOT DETECTED NOT DETECTED Final   Plesimonas shigelloides NOT DETECTED NOT DETECTED Final   Salmonella species NOT DETECTED NOT DETECTED Final   Yersinia enterocolitica NOT DETECTED NOT DETECTED Final   Vibrio species NOT DETECTED NOT DETECTED Final   Vibrio cholerae NOT DETECTED NOT DETECTED Final   Enteroaggregative E coli  (EAEC) NOT DETECTED NOT DETECTED Final   Enteropathogenic E coli (EPEC) NOT DETECTED NOT DETECTED Final   Enterotoxigenic E coli (ETEC) NOT DETECTED NOT DETECTED Final   Shiga like toxin producing E coli (STEC) NOT DETECTED NOT DETECTED Final   E. coli O157 NOT DETECTED NOT DETECTED Final   Shigella/Enteroinvasive E coli (EIEC) NOT DETECTED NOT DETECTED Final   Cryptosporidium NOT DETECTED NOT DETECTED Final   Cyclospora cayetanensis NOT DETECTED NOT  DETECTED Final   Entamoeba histolytica NOT DETECTED NOT DETECTED Final   Giardia lamblia NOT DETECTED NOT DETECTED Final   Adenovirus F40/41 NOT DETECTED NOT DETECTED Final   Astrovirus NOT DETECTED NOT DETECTED Final   Norovirus GI/GII NOT DETECTED NOT DETECTED Final   Rotavirus A NOT DETECTED NOT DETECTED Final   Sapovirus (I, II, IV, and V) NOT DETECTED NOT DETECTED Final  C difficile quick scan w PCR reflex     Status: Abnormal   Collection Time: 03/05/16 11:20 AM  Result Value Ref Range Status   C Diff antigen POSITIVE (A) NEGATIVE Final   C Diff toxin POSITIVE (A) NEGATIVE Final   C Diff interpretation Toxin producing C. difficile detected.  Final    Comment: VALID CRITICAL RESULT CALLED TO, READ BACK BY AND VERIFIED WITH: ISABELLA ROBINSON 03/05/16 1439 KLW   Urine culture     Status: Abnormal   Collection Time: 03/08/16  8:41 AM  Result Value Ref Range Status   Specimen Description URINE, RANDOM  Final   Special Requests NONE  Final   Culture 20,000 COLONIES/mL YEAST (A)  Final   Report Status 03/09/2016 FINAL  Final    RADIOLOGY:  No results found.  EKG:   Orders placed or performed during the hospital encounter of 03/04/16  . ED EKG  . ED EKG      Management plans discussed with the patient, family and they are in agreement.  CODE STATUS:     Code Status Orders        Start     Ordered   03/04/16 1357  Full code  Continuous     03/04/16 1358    Code Status History    Date Active Date Inactive Code  Status Order ID Comments User Context   03/04/2016  1:58 PM 03/10/2016  2:40 PM Full Code [id number]  Hillary Bow, MD ED   01/21/2016  9:15 PM 01/27/2016  6:01 PM Full Code [id number]  Edwin Dada, MD Inpatient   01/20/2016  6:41 PM 01/21/2016  9:15 PM Full Code [id number]  Theodoro Grist, MD Inpatient   12/18/2015  2:14 AM 12/23/2015  4:51 PM Full Code [id number]  Saundra Shelling, MD Inpatient      TOTAL TIME TAKING CARE OF THIS PATIENT: 40 minutes.    Theodoro Grist M.D on 03/11/2016 at 2:31 PM  Between 7am to 6pm - Pager - [phone]  After 6pm go to [email or web address] - password [email or web address] [facility]  Office  [phone]  CC: Primary care physician; Madelyn Brunner, MD

## 2016-03-11 NOTE — Progress Notes (Signed)
Called Report to Faith at [facility]. EMS has been called before change of shift.

## 2016-03-11 NOTE — Progress Notes (Signed)
Handoff report given to [place], RN

## 2016-03-11 NOTE — Progress Notes (Signed)
[facility]  ROUNDING NOTE   Subjective:   Family at bedside.  Some confusion  Objective:  Vital signs in last 24 hours:  Temp:  [97.7 F (36.5 C)-99 F (37.2 C)] 97.7 F (36.5 C) (08/23 1232) Pulse Rate:  [85-91] 85 (08/23 1232) Resp:  [16-18] 16 (08/23 1232) BP: (119-136)/(51-57) 131/57 (08/23 1232) SpO2:  [95 %-96 %] 96 % (08/23 1232) Weight:  [83 kg (183 lb)] 83 kg (183 lb) (08/23 0500)  Weight change: -0.862 kg (-1 lb 14.4 oz) Filed Weights   03/09/16 0520 03/10/16 0500 03/11/16 0500  Weight: 86.9 kg (191 lb 8 oz) 83.9 kg (184 lb 14.4 oz) 83 kg (183 lb)    Intake/Output: I/O last 3 completed shifts: In: 2808.1 [P.O.:360; I.V.:2448.1] Out: 0    Intake/Output this shift:  No intake/output data recorded.  Physical Exam: General: No acute distress  Head: Dry oral mucosal membranes  Eyes: Anicteric  Neck: Supple, trachea midline  Lungs:  Clear to auscultation, normal effort  Heart: S1S2 no rubs  Abdomen:  Soft, nontender, bowel sounds present  Extremities: No peripheral edema.  Neurologic: Nonfocal, moving all four extremities  Skin: No lesions  Access:     Basic Metabolic Panel:  Recent Labs Lab 03/06/16 0550 03/07/16 0556 03/08/16 5170 03/08/16 1607 03/09/16 0547 03/10/16 0507 03/11/16 0458  NA 138 137 140  --  140  --  142  K 3.5 2.8* 2.7* 3.2* 3.3* 3.8 3.2*  CL 114* 113* 111  --  108  --  103  CO2 9* 13* 18*  --  21*  --  31  GLUCOSE 90 114* 104*  --  105*  --  114*  BUN 94* 91* 81*  --  71*  --  38*  CREATININE 6.24* 4.77* 3.64*  --  2.53* 2.04* 1.83*  CALCIUM 8.1* 7.9* 8.0*  --  8.1*  --  8.4*  MG  --   --  1.4*  --  2.2  --   --   PHOS  --   --   --   --   --   --  3.2    Liver Function Tests:  Recent Labs Lab 03/11/16 0458  ALBUMIN 2.4*   No results for input(s): LIPASE, AMYLASE in the last 168 hours. No results for input(s): AMMONIA in the last 168 hours.  CBC:  Recent Labs Lab 03/04/16 2052 03/05/16 0538  03/08/16 0638 03/09/16 0547 03/11/16 0458  WBC  --  30.5* 17.6* 12.3* 11.8*  HGB 7.6* 8.5* 8.4* 8.2* 8.7*  HCT  --  26.9* 25.2* 24.7* 26.7*  MCV  --  84.6 81.2 81.2 81.9  PLT  --  347 389 368 414    Cardiac Enzymes:  Recent Labs Lab 03/08/16 0638 03/09/16 0547 03/10/16 0507  TROPONINI 0.04* 0.10* <0.03    BNP: Invalid input(s): POCBNP  CBG:  Recent Labs Lab 03/10/16 2052 03/10/16 2349 03/11/16 0441 03/11/16 0756 03/11/16 1226  GLUCAP 121* 119* 104* 112* 111*    Microbiology: Results for orders placed or performed during the hospital encounter of 03/04/16  Urine culture     Status: Abnormal   Collection Time: 03/04/16 10:50 AM  Result Value Ref Range Status   Specimen Description URINE, RANDOM  Final   Special Requests Normal  Final   Culture MULTIPLE SPECIES PRESENT, SUGGEST RECOLLECTION (A)  Final   Report Status 03/06/2016 FINAL  Final  CULTURE, BLOOD (ROUTINE X 2) w Reflex to ID Panel  Status: None   Collection Time: 03/04/16  1:57 PM  Result Value Ref Range Status   Specimen Description BLOOD REJ  Final   Special Requests   Final    BOTTLES DRAWN AEROBIC AND ANAEROBIC AER 4ML ANA 3ML   Culture NO GROWTH 5 DAYS  Final   Report Status 03/09/2016 FINAL  Final  Gastrointestinal Panel by PCR , Stool     Status: None   Collection Time: 03/05/16  1:15 AM  Result Value Ref Range Status   Campylobacter species NOT DETECTED NOT DETECTED Final   Plesimonas shigelloides NOT DETECTED NOT DETECTED Final   Salmonella species NOT DETECTED NOT DETECTED Final   Yersinia enterocolitica NOT DETECTED NOT DETECTED Final   Vibrio species NOT DETECTED NOT DETECTED Final   Vibrio cholerae NOT DETECTED NOT DETECTED Final   Enteroaggregative E coli (EAEC) NOT DETECTED NOT DETECTED Final   Enteropathogenic E coli (EPEC) NOT DETECTED NOT DETECTED Final   Enterotoxigenic E coli (ETEC) NOT DETECTED NOT DETECTED Final   Shiga like toxin producing E coli (STEC) NOT DETECTED NOT  DETECTED Final   E. coli O157 NOT DETECTED NOT DETECTED Final   Shigella/Enteroinvasive E coli (EIEC) NOT DETECTED NOT DETECTED Final   Cryptosporidium NOT DETECTED NOT DETECTED Final   Cyclospora cayetanensis NOT DETECTED NOT DETECTED Final   Entamoeba histolytica NOT DETECTED NOT DETECTED Final   Giardia lamblia NOT DETECTED NOT DETECTED Final   Adenovirus F40/41 NOT DETECTED NOT DETECTED Final   Astrovirus NOT DETECTED NOT DETECTED Final   Norovirus GI/GII NOT DETECTED NOT DETECTED Final   Rotavirus A NOT DETECTED NOT DETECTED Final   Sapovirus (I, II, IV, and V) NOT DETECTED NOT DETECTED Final  C difficile quick scan w PCR reflex     Status: Abnormal   Collection Time: 03/05/16 11:20 AM  Result Value Ref Range Status   C Diff antigen POSITIVE (A) NEGATIVE Final   C Diff toxin POSITIVE (A) NEGATIVE Final   C Diff interpretation Toxin producing C. difficile detected.  Final    Comment: VALID CRITICAL RESULT CALLED TO, READ BACK BY AND VERIFIED WITH: ISABELLA ROBINSON 03/05/16 1439 KLW   Urine culture     Status: Abnormal   Collection Time: 03/08/16  8:41 AM  Result Value Ref Range Status   Specimen Description URINE, RANDOM  Final   Special Requests NONE  Final   Culture 20,000 COLONIES/mL YEAST (A)  Final   Report Status 03/09/2016 FINAL  Final    Coagulation Studies: No results for input(s): LABPROT, INR in the last 72 hours.  Urinalysis: No results for input(s): COLORURINE, LABSPEC, PHURINE, GLUCOSEU, HGBUR, BILIRUBINUR, KETONESUR, PROTEINUR, UROBILINOGEN, NITRITE, LEUKOCYTESUR in the last 72 hours.  Invalid input(s): APPERANCEUR    Imaging: No results found.   Medications:     . dronabinol  2.5 mg Oral BID AC  . famotidine  20 mg Oral Daily  . ferrous sulfate  325 mg Oral BID WC  . folic acid  1 mg Oral Daily  . heparin subcutaneous  5,000 Units Subcutaneous Q8H  . insulin aspart  0-9 Units Subcutaneous Q4H  . latanoprost  1 drop Both Eyes QHS  . sodium  chloride flush  3 mL Intravenous Q12H  . vancomycin  500 mg Oral QID   acetaminophen **OR** acetaminophen, albuterol, bisacodyl, ondansetron **OR** ondansetron (ZOFRAN) IV, polyethylene glycol  Assessment/ Plan:  75 y.o.black female with long-standing hypertension, diabetes type 2, hyperlipidemia, osteoarthritis, obstructive sleep apnea  1.  Acute renal  failure with metabolic acidosis on chronic kidney disease stage IV.  Baseline creatinine 1.9-2.0.   Acidosis exacerbated by GI losses  2.  Anemia chronic kidney disease.  She has history of bladder cancer. Avoid EPO  3.  Left-sided hydronephrosis.  Stable based upon most recent renal ultrasound. Will monitor for obstruction  4.  C. difficile colitis. Patient continued on oral vancomycin  - GI consulted.    LOS: 7 Barbara [name] 8/23/20172:50 PM

## 2016-03-11 NOTE — Clinical Social Work Note (Signed)
Patient's daughter and husband have both agreed that [facility] is where patient will need to go as there were only two bed offers. One from [facility] and one from [facility]. Those were the only two that were willing to transport patient back and forth to her radiation appointments five days a week. Shela Leff MSW,LCSW [phone]

## 2016-03-11 NOTE — Progress Notes (Signed)
Physical Therapy Treatment Patient Details Name: Barbara [name] MRN: [id number] DOB: August 03, 1940 Today's Date: March 12, 2016    History of Present Illness Pt admitted for acute renal failure. Pt brought to ED from pain clinic where her BP was 84/52 and HR 102. Per notes, pt had been feeling gradually weak for the past couple days. HGB at 5.6 upon admission, has increased to 8.5 as of this AM. PMH includes bladder cancer, COPD, DM, HTN, anemia, CAD, and CKD (stage 3).    PT Comments    Pt agrees to session.  Stated she was out of bed earlier and fatigued.  Family confirmed.  Pt agrees to supine exercises as described below.  Pt did participate slightly more than yesterday but continues to need max verbal cues to participate "I'm lazy".  Family in during session.  "I will walk tomorrow"   Follow Up Recommendations  SNF     Equipment Recommendations  None recommended by PT    Recommendations for Other Services       Precautions / Restrictions Precautions Precautions: Fall Restrictions Weight Bearing Restrictions: No    Mobility  Bed Mobility               General bed mobility comments: refused as she was up earlier and fatigued  Transfers                 General transfer comment: refused as she was up earlier and fatigued  Ambulation/Gait                 Stairs            Wheelchair Mobility    Modified Rankin (Stroke Patients Only)       Balance                                    Cognition Arousal/Alertness: Lethargic Behavior During Therapy: Flat affect Overall Cognitive Status: Within Functional Limits for tasks assessed                      Exercises General Exercises - Lower Extremity Ankle Circles/Pumps: AAROM;Both;10 reps;Supine Quad Sets: 15 reps;Both;AROM;Supine Heel Slides: AAROM;Both;10 reps;Supine Hip ABduction/ADduction: AAROM;Both;10 reps;Supine Straight Leg Raises: AAROM;Both;10 reps;Supine     General Comments        Pertinent Vitals/Pain Pain Assessment: No/denies pain    Home Living                      Prior Function            PT Goals (current goals can now be found in the care plan section)      Frequency  Min 2X/week    PT Plan Current plan remains appropriate    Co-evaluation             End of Session   Activity Tolerance: Patient limited by lethargy Patient left: in bed;with call bell/phone within reach;with bed alarm set     Time: 1346-1356 PT Time Calculation (min) (ACUTE ONLY): 10 min  Charges:  $Therapeutic Exercise: 8-22 mins                    G Codes:      Chesley Noon 12-Mar-2016, 2:34 PM

## 2016-03-11 NOTE — Progress Notes (Signed)
[facility] NAME: Barbara [name]    MRN#:  [id number]  DATE OF BIRTH:  July 25, 1940  SUBJECTIVE:  Hospital Day: 7 days Barbara [name] is a 75 y.o. female presenting with Hypotension Patient feels good today, denies any pain, Much more comfortable today and either to engage in discussions, more alert.She is to go to skilled nursing facility with palliative care follow up. Patient is sad since she is concerned that she may die soon. REVIEW OF SYSTEMS:  Unable to obtain due to relative somnolence, some slurring of speech.  DRUG ALLERGIES:   Allergies  Allergen Reactions  . Percocet [Oxycodone-Acetaminophen] Hives    VITALS:  Blood pressure (!) 131/57, pulse 85, temperature 97.7 F (36.5 C), temperature source Oral, resp. rate 16, height 5' 8"  (1.727 m), weight 83 kg (183 lb), SpO2 96 %.  PHYSICAL EXAMINATION:  VITAL SIGNS: Vitals:   03/11/16 0545 03/11/16 1232  BP: (!) 119/51 (!) 131/57  Pulse: 90 85  Resp: 17 16  Temp: 98.4 F (36.9 C) 97.7 F (36.5 C)   GENERAL:75 y.o.female currently in no acute distress. Alert, communicative, overall comfortable HEAD: Normocephalic, atraumatic.  EYES: Pupils equal, round, reactive to light. Extraocular muscles intact. No scleral icterus.  MOUTH: Moist mucosal membrane. Dentition intact. No abscess noted. Dry oral mucosa EAR, NOSE, THROAT: Clear without exudates. No external lesions.  NECK: Supple. No thyromegaly. No nodules. No JVD.  PULMONARY: Diminished but clear to ascultation, without wheeze rails or rhonci. No use of accessory muscles, Good respiratory effort. good air entry bilaterally CHEST: Nontender to palpation.  CARDIOVASCULAR: S1 and S2. Regular rate and rhythm. No murmurs, rubs, or gallops. No edema. Pedal pulses 2+ bilaterally.  GASTROINTESTINAL: Soft, nontender, nondistended. No masses. Positive bowel sounds. No hepatosplenomegaly.  MUSCULOSKELETAL: No swelling, clubbing,  or edema. Range of motion full in all extremities.  NEUROLOGIC: Cranial nerves II through XII are intact. No gross focal neurological deficits. Sensation intact. Reflexes intact.  SKIN: No ulceration, lesions, rashes, or cyanosis. Skin warm and dry. Turgor intact.  PSYCHIATRIC: Mood, affect within normal limits.    LABORATORY PANEL:   CBC  Recent Labs Lab 03/11/16 0458  WBC 11.8*  HGB 8.7*  HCT 26.7*  PLT 414   ------------------------------------------------------------------------------------------------------------------  Chemistries   Recent Labs Lab 03/09/16 0547  03/11/16 0458  NA 140  --  142  K 3.3*  < > 3.2*  CL 108  --  103  CO2 21*  --  31  GLUCOSE 105*  --  114*  BUN 71*  --  38*  CREATININE 2.53*  < > 1.83*  CALCIUM 8.1*  --  8.4*  MG 2.2  --   --   < > = values in this interval not displayed. ------------------------------------------------------------------------------------------------------------------  Cardiac Enzymes  Recent Labs Lab 03/10/16 0507  TROPONINI <0.03   ------------------------------------------------------------------------------------------------------------------  RADIOLOGY:  No results found.  EKG:   Orders placed or performed during the hospital encounter of 03/04/16  . ED EKG  . ED EKG    ASSESSMENT AND PLAN:   Barbara [name] is a 75 y.o. female presenting with Hypotension .  1. Acute renal failure due to acute tubular necrosis: Nephrology consultI obtained and appreciated, felt to be due to acute tubular necrosis since patient had poor oral intake at home, diarrhea. Renal ultrasound revealed left hydronephrosis, better than before. Patient was initiated on IV fluid hydration  with bicarbonate at 75 cc an hour and urine output ,  renal function has been improving. Now on bicarbonate. Continue supportive therapy and follow creatinine in the morning.  Urinalysis was concerning for possible UTI, since it had numerous white  blood cells, however microbiology revealed multiple species, recollection was suggested, repeated urinalysis, catheterized revealed pyuria again, cultures are obtained , yeast  2. Sepsis , due to  C. Difficile enterocolitis : The patient received 2 doses of Ceftriaxone intravenously while in the hospital, urine culture revealed multiple bacterial organisms, recollection waperformed since repeated Urine analysis  reveals pyuria again . Blood cultures are negative so far. The patient is to be continued on vancomycin orally for C. difficile enterocolitis, the  patient had 2 bowel movements last night, small brown/green loose  3.  C. difficile enterocolitis- criteria for severe CDI - po vanco, diarrhea has not improved significantly yet , appreciate gastroenterologist recommendations , he felt that patient is improving, recommended not to change antibiotic therapy for now  4. Acute anemia over anemia of chronic disease: Status post 2 unit packed red blood cell transfusion-stable,  stable, follow  periodically, no bleeding  5 . Hypotension with a history of essential Hypertension  Off Norvasc, blood pressure has improved. Off IV fluids.  6 DVT prophylaxis with SCDs. heparin  7. Pyuria, urine cultures showed multiple bacterial organisms, recollection was suggested, repeated urinalysis  still shows pyuria , getting urine cultures  today , patient received antibiotic therapy for 2 days with intravenous Rocephin. Patient is relatively asymptomatic at present, however, the cell count is improving with vancomycin alone.   8. Leukocytoss, improved, not back to normal, likely C. difficile infection related   9. 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 She was evaluated by radiation oncology, recommended chemoradiation for definitive therapy of bladder cancer. The patient is not a candidate for cisplatin given the  renal insufficiency, considering carboplatin and Taxol along with radiation  10. Failure to thrive adult, patient continues to have very poor oral intake, initiated Marinol, some improved, will follow closely, patient will be followed by palliative care as outpatient  11 acidosis due to renal failure, now off bicarbonate IV drip, resolved  12. Hypokalemia, magnesium level was found to be low, supplement both intravenously, potassium remains low, supplemented orally, recheck magnesium levels today and supplement as needed   All the records are reviewed and case discussed with Care Management/Social Workerr. Management plans discussed with the patient, family and they are in agreement.  CODE STATUS: full TOTAL TIME TAKING CARE OF THIS PATIENT: 30 minutes.   POSSIBLE D/C IN 2-3DAYS, DEPENDING ON CLINICAL CONDITION.   Theodoro Grist M.D on 03/11/2016 at 12:56 PM  Between 7am to 6pm - Pager - [phone]  After 6pm: House Pager: - [phone]  [facility]  Office  [phone]  CC: Primary care physician; Madelyn Brunner, MD

## 2016-03-12 ENCOUNTER — Ambulatory Visit: Payer: Medicare Other

## 2016-03-13 ENCOUNTER — Ambulatory Visit: Payer: Medicare Other

## 2016-03-13 DIAGNOSIS — I129 Hypertensive chronic kidney disease with stage 1 through stage 4 chronic kidney disease, or unspecified chronic kidney disease: Secondary | ICD-10-CM | POA: Diagnosis not present

## 2016-03-13 DIAGNOSIS — E119 Type 2 diabetes mellitus without complications: Secondary | ICD-10-CM | POA: Insufficient documentation

## 2016-03-13 DIAGNOSIS — C670 Malignant neoplasm of trigone of bladder: Secondary | ICD-10-CM | POA: Diagnosis present

## 2016-03-13 DIAGNOSIS — R197 Diarrhea, unspecified: Secondary | ICD-10-CM | POA: Insufficient documentation

## 2016-03-13 DIAGNOSIS — N189 Chronic kidney disease, unspecified: Secondary | ICD-10-CM | POA: Insufficient documentation

## 2016-03-13 DIAGNOSIS — Z79899 Other long term (current) drug therapy: Secondary | ICD-10-CM | POA: Diagnosis not present

## 2016-03-13 DIAGNOSIS — Z794 Long term (current) use of insulin: Secondary | ICD-10-CM | POA: Insufficient documentation

## 2016-03-13 DIAGNOSIS — Z809 Family history of malignant neoplasm, unspecified: Secondary | ICD-10-CM | POA: Insufficient documentation

## 2016-03-13 DIAGNOSIS — J449 Chronic obstructive pulmonary disease, unspecified: Secondary | ICD-10-CM | POA: Diagnosis not present

## 2016-03-13 DIAGNOSIS — Z7982 Long term (current) use of aspirin: Secondary | ICD-10-CM | POA: Diagnosis not present

## 2016-03-13 DIAGNOSIS — Z510 Encounter for antineoplastic radiation therapy: Secondary | ICD-10-CM | POA: Diagnosis not present

## 2016-03-13 DIAGNOSIS — M129 Arthropathy, unspecified: Secondary | ICD-10-CM | POA: Insufficient documentation

## 2016-03-16 ENCOUNTER — Ambulatory Visit: Payer: Medicare Other

## 2016-03-16 ENCOUNTER — Ambulatory Visit
Admission: RE | Admit: 2016-03-16 | Discharge: 2016-03-16 | Disposition: A | Discharge status: Home / Self Care | Payer: Medicare Other | Source: Ambulatory Visit | Attending: Radiation Oncology | Admitting: Radiation Oncology

## 2016-03-16 DIAGNOSIS — C670 Malignant neoplasm of trigone of bladder: Secondary | ICD-10-CM | POA: Diagnosis not present

## 2016-03-17 ENCOUNTER — Ambulatory Visit
Admission: RE | Admit: 2016-03-17 | Discharge: 2016-03-17 | Disposition: A | Discharge status: Home / Self Care | Payer: Medicare Other | Source: Ambulatory Visit | Attending: Radiation Oncology | Admitting: Radiation Oncology

## 2016-03-17 DIAGNOSIS — C670 Malignant neoplasm of trigone of bladder: Secondary | ICD-10-CM | POA: Diagnosis not present

## 2016-03-18 ENCOUNTER — Ambulatory Visit
Admission: RE | Admit: 2016-03-18 | Discharge: 2016-03-18 | Disposition: A | Discharge status: Home / Self Care | Payer: Medicare Other | Source: Ambulatory Visit | Attending: Radiation Oncology | Admitting: Radiation Oncology

## 2016-03-18 DIAGNOSIS — Z7189 Other specified counseling: Secondary | ICD-10-CM

## 2016-03-18 DIAGNOSIS — C670 Malignant neoplasm of trigone of bladder: Secondary | ICD-10-CM | POA: Diagnosis not present

## 2016-03-18 DIAGNOSIS — Z515 Encounter for palliative care: Secondary | ICD-10-CM

## 2016-03-19 ENCOUNTER — Inpatient Hospital Stay: Payer: Medicare Other | Attending: Radiation Oncology

## 2016-03-19 ENCOUNTER — Ambulatory Visit
Admission: RE | Admit: 2016-03-19 | Discharge: 2016-03-19 | Disposition: A | Discharge status: Home / Self Care | Payer: Medicare Other | Source: Ambulatory Visit | Attending: Radiation Oncology | Admitting: Radiation Oncology

## 2016-03-19 DIAGNOSIS — C670 Malignant neoplasm of trigone of bladder: Secondary | ICD-10-CM | POA: Diagnosis present

## 2016-03-19 LAB — CBC
HEMATOCRIT: 24.8 % — AB (ref 35.0–47.0)
Hemoglobin: 8 g/dL — ABNORMAL LOW (ref 12.0–16.0)
MCH: 26.8 pg (ref 26.0–34.0)
MCHC: 32.4 g/dL (ref 32.0–36.0)
MCV: 82.7 fL (ref 80.0–100.0)
Platelets: 396 10*3/uL (ref 150–440)
RBC: 3 MIL/uL — AB (ref 3.80–5.20)
RDW: 16.2 % — ABNORMAL HIGH (ref 11.5–14.5)
WBC: 9.5 10*3/uL (ref 3.6–11.0)

## 2016-03-20 ENCOUNTER — Ambulatory Visit
Admission: RE | Admit: 2016-03-20 | Discharge: 2016-03-20 | Disposition: A | Discharge status: Home / Self Care | Payer: Medicare Other | Source: Ambulatory Visit | Attending: Radiation Oncology | Admitting: Radiation Oncology

## 2016-03-20 ENCOUNTER — Inpatient Hospital Stay: Payer: Medicare Other | Attending: Internal Medicine | Admitting: Internal Medicine

## 2016-03-20 VITALS — BP 137/71 | HR 87 | Temp 98.6°F | Resp 20

## 2016-03-20 DIAGNOSIS — I129 Hypertensive chronic kidney disease with stage 1 through stage 4 chronic kidney disease, or unspecified chronic kidney disease: Secondary | ICD-10-CM | POA: Insufficient documentation

## 2016-03-20 DIAGNOSIS — D631 Anemia in chronic kidney disease: Secondary | ICD-10-CM | POA: Diagnosis not present

## 2016-03-20 DIAGNOSIS — C670 Malignant neoplasm of trigone of bladder: Secondary | ICD-10-CM | POA: Insufficient documentation

## 2016-03-20 DIAGNOSIS — N184 Chronic kidney disease, stage 4 (severe): Secondary | ICD-10-CM | POA: Insufficient documentation

## 2016-03-20 DIAGNOSIS — Z794 Long term (current) use of insulin: Secondary | ICD-10-CM | POA: Diagnosis not present

## 2016-03-20 DIAGNOSIS — Z7982 Long term (current) use of aspirin: Secondary | ICD-10-CM | POA: Diagnosis not present

## 2016-03-20 DIAGNOSIS — M1990 Unspecified osteoarthritis, unspecified site: Secondary | ICD-10-CM | POA: Insufficient documentation

## 2016-03-20 DIAGNOSIS — G4730 Sleep apnea, unspecified: Secondary | ICD-10-CM | POA: Diagnosis not present

## 2016-03-20 DIAGNOSIS — N179 Acute kidney failure, unspecified: Secondary | ICD-10-CM | POA: Insufficient documentation

## 2016-03-20 DIAGNOSIS — Z79899 Other long term (current) drug therapy: Secondary | ICD-10-CM

## 2016-03-20 DIAGNOSIS — J449 Chronic obstructive pulmonary disease, unspecified: Secondary | ICD-10-CM | POA: Diagnosis not present

## 2016-03-20 DIAGNOSIS — Z87891 Personal history of nicotine dependence: Secondary | ICD-10-CM | POA: Insufficient documentation

## 2016-03-20 DIAGNOSIS — E1122 Type 2 diabetes mellitus with diabetic chronic kidney disease: Secondary | ICD-10-CM | POA: Insufficient documentation

## 2016-03-20 NOTE — Progress Notes (Signed)
[name] OFFICE PROGRESS NOTE  Patient Care Team: Madelyn Brunner, MD as PCP - General (Internal Medicine)  No matching staging information was found for the patient.   Oncology History    #AUG 2017 TURBT- [Dr.Brandon] Large nodular proximally 5 cm bladder mass involving the left hemitrigone extending up the left lateral bladder wall, 2 discrete nodules with bridge of abnormal tissue; Tumor clinically T2. TURBT- Tumor invades muscularis propria [detrusor muscle]. NOT candidate for cystectomy.  # SEP 2017- RT- GEM weekly.  # Acute Renal failure/CKD IV / C.diff [aug 2017]     Cancer of trigone of urinary bladder ([place])   03/02/2016 Initial Diagnosis    Cancer of trigone of urinary bladder ([place])          INTERVAL HISTORY:  Barbara [name] 75 y.o.  female pleasant patient above history of Recently diagnosed muscle invasive bladder cancer; not a candidate for cystectomy is here for follow-up.  Patient's recent medical history was complicated by acute renal failure/ pre-renal C. difficile colitis she is currently in a rehabilitation. Patient continues to feel weak.  Denies any abdominal pain. Denies any unusual shortness of breath or cough. No fever no chills. Complains of pain in the left ankle. Patient's diarrhea is resolved. She is on by mouth vancomycin.  REVIEW OF SYSTEMS:  A complete 10 point review of system is done which is negative except mentioned above/history of present illness.   PAST MEDICAL HISTORY :  Past Medical History:  Diagnosis Date  . Anemia associated with chronic renal failure   . Arthritis   . CKD (chronic kidney disease)   . COPD (chronic obstructive pulmonary disease) ([place])   . Diabetes mellitus without complication ([place])   . Hypertension   . Sleep apnea    uses cpap  . Urinary obstruction 01/2016    PAST SURGICAL HISTORY :   Past Surgical History:  Procedure Laterality Date  . ABDOMINAL HYSTERECTOMY    . CYSTOSCOPY W/  RETROGRADES Bilateral 02/17/2016   Procedure: CYSTOSCOPY WITH RETROGRADE PYELOGRAM;  Surgeon: Hollice Espy, MD;  Location: [facility];  Service: Urology;  Laterality: Bilateral;  . CYSTOSCOPY WITH STENT PLACEMENT Left 01/21/2016   Procedure: CYSTOSCOPY WITH double J STENT PLACEMENT;  Surgeon: Franchot Gallo, MD;  Location: [facility];  Service: Urology;  Laterality: Left;  . KIDNEY SURGERY  01/21/2016   IR NEPHROSTOMY PLACEMENT LEFT   . ROTATOR CUFF REPAIR     left  . TRANSURETHRAL RESECTION OF BLADDER TUMOR N/A 02/17/2016   Procedure: TRANSURETHRAL RESECTION OF BLADDER TUMOR (TURBT)-LARGE;  Surgeon: Hollice Espy, MD;  Location: [facility];  Service: Urology;  Laterality: N/A;  . URETEROSCOPY Left 02/17/2016   Procedure: URETEROSCOPY;  Surgeon: Hollice Espy, MD;  Location: [facility];  Service: Urology;  Laterality: Left;    FAMILY HISTORY :   Family History  Problem Relation Age of Onset  . Diabetes Mother   . Cancer Father   . Breast cancer Neg Hx   . Kidney disease Neg Hx     SOCIAL HISTORY:   Social History  Substance Use Topics  . Smoking status: Former Smoker    Quit date: 07/20/1993  . Smokeless tobacco: Never Used     Comment: 01/22/2016   "  quit smoking many years ago "  . Alcohol use No    ALLERGIES:  is allergic to percocet [oxycodone-acetaminophen].  MEDICATIONS:  Current Outpatient Prescriptions  Medication Sig Dispense Refill  . acidophilus (RISAQUAD) CAPS capsule Take  1 capsule by mouth daily. 30 capsule 5  . Albuterol Sulfate (PROAIR RESPICLICK) 295 (90 Base) MCG/ACT AEPB Inhale 2 puffs into the lungs 4 (four) times daily as needed (wheezing).     Marland Kitchen aspirin 81 MG tablet Take 81 mg by mouth 2 (two) times daily.    Marland Kitchen atorvastatin (LIPITOR) 40 MG tablet Take 40 mg by mouth daily.    . Cholecalciferol (VITAMIN D-3) 1000 units CAPS Take 1 capsule by mouth every morning.    . dronabinol (MARINOL) 2.5 MG capsule Take 1 capsule (2.5 mg total) by mouth 2 (two) times daily  before lunch and supper. 60 capsule 5  . famotidine (PEPCID) 20 MG tablet Take 1 tablet (20 mg total) by mouth daily. 30 tablet 0  . ferrous sulfate 325 (65 FE) MG tablet Take 1 tablet (325 mg total) by mouth 2 (two) times daily with a meal. 60 tablet 0  . folic acid (FOLVITE) 1 MG tablet Take 1 tablet (1 mg total) by mouth daily. 30 tablet 0  . gabapentin (NEURONTIN) 100 MG capsule Take 100 mg by mouth 2 (two) times daily.    Marland Kitchen HYDROcodone-acetaminophen (NORCO/VICODIN) 5-325 MG tablet Take 1-2 tablets by mouth every 6 (six) hours as needed for moderate pain. 15 tablet 0  . insulin aspart (NOVOLOG) 100 UNIT/ML injection Inject 0-15 Units into the skin 3 (three) times daily with meals. (Patient taking differently: Inject 0-15 Units into the skin 3 (three) times daily with meals. If BS <60 call MD, if BS is 70-120=0u, 121-150=2u, 151-200=3, 201-250=5u, 251-300=8u, 301-350=11u, 351-400=15u, if >450 call MD) 10 mL 0  . NON FORMULARY Place 1 Units into the nose at bedtime. CPAP Time of use 2100-0600    . Nutritional Supplements (GLUCERNA 1.0 CAL) LIQD Take 1 Can by mouth 3 (three) times daily. 90 Bottle 5  . Travoprost, BAK Free, (TRAVATAN) 0.004 % SOLN ophthalmic solution Place 1 drop into both eyes at bedtime.    . vancomycin (VANCOCIN) 50 mg/mL oral solution Take 10 mLs (500 mg total) by mouth 4 (four) times daily. 400 mL 0  . bisacodyl (DULCOLAX) 10 MG suppository Place 1 suppository (10 mg total) rectally daily as needed for moderate constipation. (Patient not taking: Reported on 03/20/2016) 12 suppository 0   No current facility-administered medications for this visit.     PHYSICAL EXAMINATION: ECOG PERFORMANCE STATUS: 2 - Symptomatic, <50% confined to bed  BP 137/71 (BP Location: Right Arm, Patient Position: Sitting)   Pulse 87   Temp 98.6 F (37 C) (Oral)   Resp 20   Filed Weights    GENERAL: Well-nourished well-developed; Alert, no distress and comfortable.  With her husband. She is in  a wheelchair. Obese. EYES: no pallor or icterus OROPHARYNX: no thrush or ulceration; good dentition  NECK: supple, no masses felt LYMPH:  no palpable lymphadenopathy in the cervical, axillary or inguinal regions LUNGS: clear to auscultation and  No wheeze or crackles HEART/CVS: regular rate & rhythm and no murmurs; No lower extremity edema ABDOMEN:abdomen soft, non-tender and normal bowel sounds Musculoskeletal:no cyanosis of digits and no clubbing  PSYCH: alert & oriented x 3 with fluent speech NEURO: no focal motor/sensory deficits SKIN:  no rashes or significant lesions  LABORATORY DATA:  I have reviewed the data as listed    Component Value Date/Time   NA 142 03/11/2016 0458   NA 138 08/07/2012 0433   K 3.2 (L) 03/11/2016 0458   K 3.2 (L) 10/30/2014 1410   CL 103  03/11/2016 0458   CL 104 08/07/2012 0433   CO2 31 03/11/2016 0458   CO2 25 08/07/2012 0433   GLUCOSE 114 (H) 03/11/2016 0458   GLUCOSE 172 (H) 08/07/2012 0433   BUN 38 (H) 03/11/2016 0458   BUN 63 (H) 08/07/2012 0433   CREATININE 1.83 (H) 03/11/2016 0458   CREATININE 2.48 (H) 08/07/2012 0433   CALCIUM 8.4 (L) 03/11/2016 0458   CALCIUM 9.1 08/07/2012 0433   PROT 7.6 03/04/2016 1046   PROT 8.9 (H) 08/02/2012 0958   ALBUMIN 2.4 (L) 03/11/2016 0458   ALBUMIN 3.9 08/02/2012 0958   AST 42 (H) 03/04/2016 1046   AST 21 08/02/2012 0958   ALT 38 03/04/2016 1046   ALT 19 08/02/2012 0958   ALKPHOS 88 03/04/2016 1046   ALKPHOS 107 08/02/2012 0958   BILITOT 0.8 03/04/2016 1046   BILITOT 0.3 08/02/2012 0958   GFRNONAA 26 (L) 03/11/2016 0458   GFRNONAA 19 (L) 08/07/2012 0433   GFRAA 30 (L) 03/11/2016 0458   GFRAA 22 (L) 08/07/2012 0433    No results found for: SPEP, UPEP  Lab Results  Component Value Date   WBC 9.5 03/19/2016   NEUTROABS 25.7 (H) 03/04/2016   HGB 8.0 (L) 03/19/2016   HCT 24.8 (L) 03/19/2016   MCV 82.7 03/19/2016   PLT 396 03/19/2016      Chemistry      Component Value Date/Time   NA 142  03/11/2016 0458   NA 138 08/07/2012 0433   K 3.2 (L) 03/11/2016 0458   K 3.2 (L) 10/30/2014 1410   CL 103 03/11/2016 0458   CL 104 08/07/2012 0433   CO2 31 03/11/2016 0458   CO2 25 08/07/2012 0433   BUN 38 (H) 03/11/2016 0458   BUN 63 (H) 08/07/2012 0433   CREATININE 1.83 (H) 03/11/2016 0458   CREATININE 2.48 (H) 08/07/2012 0433      Component Value Date/Time   CALCIUM 8.4 (L) 03/11/2016 0458   CALCIUM 9.1 08/07/2012 0433   ALKPHOS 88 03/04/2016 1046   ALKPHOS 107 08/02/2012 0958   AST 42 (H) 03/04/2016 1046   AST 21 08/02/2012 0958   ALT 38 03/04/2016 1046   ALT 19 08/02/2012 0958   BILITOT 0.8 03/04/2016 1046   BILITOT 0.3 08/02/2012 0958       RADIOGRAPHIC STUDIES: I have personally reviewed the radiological images as listed and agreed with the findings in the report. No results found.   ASSESSMENT & PLAN:  Cancer of trigone of urinary bladder (HCC) Muscle invasive bladder cancer/c T2/ stage II- not a candidate for cystectomy given multiple comorbidities.  I reviewed the treatment options with the patient and husband detail again- and given the fact that she is not a candidate for cystectomy- recommend concurrent chemoradiation. Patient cannot tolerate cisplatin/chemotherapy with radiation. I would recommend gemcitabine 500 mg/m weekly with radiation.   # Poor IV access/recommend for placement. Chemotherapy education.  # Patient starting radiation next week; however we will plan starting gemcitabine chemotherapy in the week of September 11th. Discussed the potential adverse effects of chemotherapy including but not limited to skin rash; drop in blood counts; leg swelling.  # Multiple comorbidities including- stage IV CKD; recent C. Difficile/hospitalization acutely failure.  # Follow-up with me week of the 18th/second treatment/check CBC CMP prior to chemotherapy  Thank you Dr. Erlene Quan for allowing me to participate in the care of your pleasant patient. Please do not  hesitate to contact me with questions or concerns in the interim.  Orders Placed This Encounter  Procedures  . CBC with Differential    Standing Status:   Future    Standing Expiration Date:   03/20/2017  . Comprehensive metabolic panel    Standing Status:   Future    Standing Expiration Date:   03/20/2017   All questions were answered. The patient knows to call the clinic with any problems, questions or concerns.      Cammie Sickle, MD 03/20/2016 6:50 PM

## 2016-03-20 NOTE — Assessment & Plan Note (Signed)
Muscle invasive bladder cancer/c T2/ stage II- not a candidate for cystectomy given multiple comorbidities.  I reviewed the treatment options with the patient and husband detail again- and given the fact that she is not a candidate for cystectomy- recommend concurrent chemoradiation. Patient cannot tolerate cisplatin/chemotherapy with radiation. I would recommend gemcitabine 500 mg/m weekly with radiation.   # Poor IV access/recommend for placement. Chemotherapy education.  # Patient starting radiation next week; however we will plan starting gemcitabine chemotherapy in the week of September 11th. Discussed the potential adverse effects of chemotherapy including but not limited to skin rash; drop in blood counts; leg swelling.  # Multiple comorbidities including- stage IV CKD; recent C. Difficile/hospitalization acutely failure.  # Follow-up with me week of the 18th/second treatment/check CBC CMP prior to chemotherapy  Thank you Dr. Erlene Quan for allowing me to participate in the care of your pleasant patient. Please do not hesitate to contact me with questions or concerns in the interim.

## 2016-03-24 ENCOUNTER — Ambulatory Visit
Admission: RE | Admit: 2016-03-24 | Discharge: 2016-03-24 | Disposition: A | Discharge status: Home / Self Care | Payer: Medicare Other | Source: Ambulatory Visit | Attending: Radiation Oncology | Admitting: Radiation Oncology

## 2016-03-24 DIAGNOSIS — C670 Malignant neoplasm of trigone of bladder: Secondary | ICD-10-CM | POA: Diagnosis not present

## 2016-03-25 ENCOUNTER — Ambulatory Visit
Admission: RE | Admit: 2016-03-25 | Discharge: 2016-03-25 | Disposition: A | Discharge status: Home / Self Care | Payer: Medicare Other | Source: Ambulatory Visit | Attending: Radiation Oncology | Admitting: Radiation Oncology

## 2016-03-25 DIAGNOSIS — C670 Malignant neoplasm of trigone of bladder: Secondary | ICD-10-CM | POA: Diagnosis not present

## 2016-03-25 NOTE — Patient Instructions (Signed)

## 2016-03-26 ENCOUNTER — Inpatient Hospital Stay: Payer: Medicare Other

## 2016-03-26 ENCOUNTER — Other Ambulatory Visit: Payer: Self-pay

## 2016-03-26 ENCOUNTER — Ambulatory Visit
Admission: RE | Admit: 2016-03-26 | Discharge: 2016-03-26 | Disposition: A | Discharge status: Home / Self Care | Payer: Medicare Other | Source: Ambulatory Visit | Attending: Radiation Oncology | Admitting: Radiation Oncology

## 2016-03-26 DIAGNOSIS — C670 Malignant neoplasm of trigone of bladder: Secondary | ICD-10-CM | POA: Diagnosis not present

## 2016-03-26 LAB — CBC
HCT: 25.7 % — ABNORMAL LOW (ref 35.0–47.0)
Hemoglobin: 8.3 g/dL — ABNORMAL LOW (ref 12.0–16.0)
MCH: 26.6 pg (ref 26.0–34.0)
MCHC: 32.4 g/dL (ref 32.0–36.0)
MCV: 82.1 fL (ref 80.0–100.0)
PLATELETS: 415 10*3/uL (ref 150–440)
RBC: 3.14 MIL/uL — ABNORMAL LOW (ref 3.80–5.20)
RDW: 16.3 % — AB (ref 11.5–14.5)
WBC: 8.1 10*3/uL (ref 3.6–11.0)

## 2016-03-27 ENCOUNTER — Ambulatory Visit
Admission: RE | Admit: 2016-03-27 | Discharge: 2016-03-27 | Disposition: A | Discharge status: Home / Self Care | Payer: Medicare Other | Source: Ambulatory Visit | Attending: Radiation Oncology | Admitting: Radiation Oncology

## 2016-03-27 DIAGNOSIS — C670 Malignant neoplasm of trigone of bladder: Secondary | ICD-10-CM | POA: Diagnosis not present

## 2016-03-30 ENCOUNTER — Ambulatory Visit
Admission: RE | Admit: 2016-03-30 | Discharge: 2016-03-30 | Disposition: A | Discharge status: Home / Self Care | Payer: Medicare Other | Source: Ambulatory Visit | Attending: Radiation Oncology | Admitting: Radiation Oncology

## 2016-03-30 DIAGNOSIS — C670 Malignant neoplasm of trigone of bladder: Secondary | ICD-10-CM | POA: Diagnosis not present

## 2016-03-31 ENCOUNTER — Ambulatory Visit
Admission: RE | Admit: 2016-03-31 | Discharge: 2016-03-31 | Disposition: A | Discharge status: Home / Self Care | Payer: Medicare Other | Source: Ambulatory Visit | Attending: Radiation Oncology | Admitting: Radiation Oncology

## 2016-03-31 ENCOUNTER — Other Ambulatory Visit: Payer: Self-pay | Admitting: *Deleted

## 2016-03-31 DIAGNOSIS — C670 Malignant neoplasm of trigone of bladder: Secondary | ICD-10-CM | POA: Diagnosis not present

## 2016-03-31 MED ORDER — LOPERAMIDE HCL 2 MG PO CAPS: 2.0000 mg | ORAL_CAPSULE | Freq: Two times a day (BID) | ORAL | 0 refills | Status: DC

## 2016-04-01 ENCOUNTER — Ambulatory Visit (INDEPENDENT_AMBULATORY_CARE_PROVIDER_SITE_OTHER): Payer: Medicare Other | Admitting: Urology

## 2016-04-01 ENCOUNTER — Ambulatory Visit
Admission: RE | Admit: 2016-04-01 | Discharge: 2016-04-01 | Disposition: A | Discharge status: Home / Self Care | Payer: Medicare Other | Source: Ambulatory Visit | Attending: Radiation Oncology | Admitting: Radiation Oncology

## 2016-04-01 VITALS — BP 98/57 | HR 96 | Ht 68.0 in | Wt 176.0 lb

## 2016-04-01 DIAGNOSIS — C670 Malignant neoplasm of trigone of bladder: Secondary | ICD-10-CM

## 2016-04-01 DIAGNOSIS — N184 Chronic kidney disease, stage 4 (severe): Secondary | ICD-10-CM | POA: Diagnosis not present

## 2016-04-01 DIAGNOSIS — N1330 Unspecified hydronephrosis: Secondary | ICD-10-CM | POA: Diagnosis not present

## 2016-04-01 NOTE — Progress Notes (Signed)
04/01/2016 4:08 PM   Barbara [name] 01-29-1941 [id number]  Referring provider: Madelyn Brunner, MD [place] [facility] [place], [place] [address]  Chief Complaint  Patient presents with  . Follow-up    4wk    HPI: 75 year old female muscle invasive bladder cancer who returns today following TURBT, left ureteral stent placement who returns for reevaluation after recent Readmission to the hospital with acute renal failure, anemia, weakness, and hypotension to be secondary to C. difficile enterocolitis.  She was discharged back to [facility] where she remains currently.  She initally presented with obstructing left UVJ mass in the setting of urosepsis on 01/21/2016.  Prior to this admission, she did have a previous admission for Escherichia coli sepsis with associated left flank and lower abdominal pain.  She was taken to the operating room by Dr. Diona Fanti at which time a nodular tumor involving the left hemitrigone was appreciated. 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 Due to the distortion of the trigone, the ureteral stent was unable to be placed. She subsequently developed fevers and underwent left percutaneous nephrostomy tube after being transferred to [facility].  She returned to the OR on 02/17/16 for TURBT, B RTG, EUA, and ureteral stent placement.  Pathology consistent with high grade muscle invasive bladder cancer.  Her left UO was able to identified upon resection and stent placed, nephrostomy tube removed.  Clinically T2 on exam under anesthesia.    She does have multiple medical comorbidities including stage IV CKD with recent acute on chronic acute kidney injury, COPD, diabetes, hypertension, OSA.    Smoked 2 ppd, quit ~97.  Currently undergoing radiation treatment. Plan for port placement next week followed by chemotherapy in the form of  gemcitabine.  Overall today, she continues to have copious amount of loose stool which she relates to both C. difficile and radiation. She is overall weak.  No urinary complaints today other than weakness.    PMH: Past Medical History:  Diagnosis Date  . Anemia associated with chronic renal failure   . Arthritis   . CKD (chronic kidney disease)   . COPD (chronic obstructive pulmonary disease) ([place])   . Diabetes mellitus without complication ([place])   . Hypertension   . Sleep apnea    uses cpap  . Urinary obstruction 01/2016    Surgical History: Past Surgical History:  Procedure Laterality Date  . ABDOMINAL HYSTERECTOMY    . CYSTOSCOPY W/ RETROGRADES Bilateral 02/17/2016   Procedure: CYSTOSCOPY WITH RETROGRADE PYELOGRAM;  Surgeon: Hollice Espy, MD;  Location: [facility];  Service: Urology;  Laterality: Bilateral;  . CYSTOSCOPY WITH STENT PLACEMENT Left 01/21/2016   Procedure: CYSTOSCOPY WITH double J STENT PLACEMENT;  Surgeon: Franchot Gallo, MD;  Location: [facility];  Service: Urology;  Laterality: Left;  . KIDNEY SURGERY  01/21/2016   IR NEPHROSTOMY PLACEMENT LEFT   . ROTATOR CUFF REPAIR     left  . TRANSURETHRAL RESECTION OF BLADDER TUMOR N/A 02/17/2016   Procedure: TRANSURETHRAL RESECTION OF BLADDER TUMOR (TURBT)-LARGE;  Surgeon: Hollice Espy, MD;  Location: [facility];  Service: Urology;  Laterality: N/A;  . URETEROSCOPY Left 02/17/2016   Procedure: URETEROSCOPY;  Surgeon: Hollice Espy, MD;  Location: [facility];  Service: Urology;  Laterality: Left;    Home Medications:    Medication List       Accurate as of 04/01/16 11:59 PM. Always use your most recent med list.  atorvastatin 40 MG tablet Commonly known as:  LIPITOR Take 40 mg by mouth daily.   bisacodyl 10 MG suppository Commonly known as:  DULCOLAX Place 1 suppository (10 mg total) rectally daily as needed for moderate constipation.   dronabinol 2.5 MG capsule Commonly known as:  MARINOL Take 1  capsule (2.5 mg total) by mouth 2 (two) times daily before lunch and supper.   famotidine 20 MG tablet Commonly known as:  PEPCID Take 1 tablet (20 mg total) by mouth daily.   ferrous sulfate 325 (65 FE) MG tablet Take 1 tablet (325 mg total) by mouth 2 (two) times daily with a meal.   folic acid 1 MG tablet Commonly known as:  FOLVITE Take 1 tablet (1 mg total) by mouth daily.   gabapentin 100 MG capsule Commonly known as:  NEURONTIN Take 100 mg by mouth 2 (two) times daily.   HYDROcodone-acetaminophen 5-325 MG tablet Commonly known as:  NORCO/VICODIN Take 1-2 tablets by mouth every 6 (six) hours as needed for moderate pain.   insulin aspart 100 UNIT/ML injection Commonly known as:  novoLOG Inject 0-15 Units into the skin 3 (three) times daily with meals.   NON FORMULARY Place 1 Units into the nose at bedtime. CPAP Time of use 8676-1950   PROAIR RESPICLICK 932 (90 Base) MCG/ACT Aepb Generic drug:  Albuterol Sulfate Inhale 2 puffs into the lungs 4 (four) times daily as needed (wheezing).   Travoprost (BAK Free) 0.004 % Soln ophthalmic solution Commonly known as:  TRAVATAN Place 1 drop into both eyes at bedtime.   Vitamin D-3 1000 units Caps Take 1 capsule by mouth every morning.       Allergies:  Allergies  Allergen Reactions  . Percocet [Oxycodone-Acetaminophen] Hives    Family History: Family History  Problem Relation Age of Onset  . Diabetes Mother   . Cancer Father   . Breast cancer Neg Hx   . Kidney disease Neg Hx     Social History:  reports that she quit smoking about 22 years ago. She has never used smokeless tobacco. She reports that she does not drink alcohol or use drugs.  ROS:  12 point review of systems form, negative other than as per history of present illness. UROLOGY Frequent Urination?: No Hard to postpone urination?: No Burning/pain with urination?: Yes Get up at night to urinate?: No Leakage of urine?: No Urine stream starts and  stops?: No Trouble starting stream?: No Do you have to strain to urinate?: No Blood in urine?: No Urinary tract infection?: No Sexually transmitted disease?: No Injury to kidneys or bladder?: No Painful intercourse?: No Weak stream?: No Currently pregnant?: No Vaginal bleeding?: No Last menstrual period?: n    Physical Exam: BP (!) 98/57   Pulse 96   Ht 5' 8"  (1.727 m)   Wt 176 lb (79.8 kg)   BMI 26.76 kg/m   Constitutional:  Alert and oriented, No acute distress.  In wheelchair accompanied by husband today. HEENT: [place] AT, moist mucus membranes.  Trachea midline, no masses. Cardiovascular: No clubbing, cyanosis, or edema.   Respiratory: Normal respiratory effort, no increased work of breathing.   GI: Abdomen is soft, nontender, nondistended, no abdominal masses GU: No catheter.   Skin: No rashes, bruises or suspicious lesions. Neurologic: Grossly intact, no focal deficits, moving all 4 extremities. LE weakness appreciated.   Psychiatric: Normal mood and affect.  Laboratory Data: Lab Results  Component Value Date   WBC 8.4 04/02/2016   HGB 8.4 (  L) 04/02/2016   HCT 25.4 (L) 04/02/2016   MCV 81.8 04/02/2016   PLT 305 04/02/2016    Lab Results  Component Value Date   CREATININE 3.43 (H) 04/02/2016     Lab Results  Component Value Date   HGBA1C 6.5 (H) 01/20/2016     Pertinent Imaging: CLINICAL DATA:  Acute renal failure.  History of bladder cancer.  EXAM: RENAL / URINARY TRACT ULTRASOUND COMPLETE  COMPARISON:  CT scan 01/20/2016  FINDINGS: Right Kidney:  Length: 10.6 cm.  Slight increase in echogenicity.  Left Kidney:  Length: 10.2 cm. 5.2 cm complex cyst identified interpolar left kidney, similar to previous CT scan. Mild distention of the left intrarenal collecting system noted.  Bladder:  Moderately distended with circumferential bladder wall thickening. There is focal irregular bladder wall thickening in the region of the left  UVJ.  IMPRESSION: Mild left hydronephrosis.  Focal irregular wall thickening in the region of the left UVJ, suspicious for neoplasm as seen on previous CT scan.   Electronically Signed   By: Misty Stanley M.D.   On: 03/04/2016 15:25  Assessment & Plan:    1. 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  Currently undergoing radiation with plans for chemotherapy (gemcitabine) after port placement  Not candidate for cystectomy given overall poor functional status   2. Hydronephrosis, left Secondary to obstructing mass s/p TUR Bard Optima left double-J stent remains in place --> will need to be replaced, can stay no longer than 6 months Advised to let us know if going to OR for any reason in near future in order to combine cases to avoid additional anesthesia Will likely arrange for cysto/ stent exchange/ repeat TUR as needed in ~3 months after chemo/ rads  3. Chronic kidney disease, stage IV (severe) (HCC) Left indwelling ureteral stent, as above Recent episode of acute on chronic renal failure   Return in about 3 months (around 07/01/2016) for to discuss stent exchange.  Hollice Espy, MD  [facility] [address], [place], [place] [address] [phone]

## 2016-04-02 ENCOUNTER — Inpatient Hospital Stay: Payer: Medicare Other

## 2016-04-02 ENCOUNTER — Encounter: Payer: Self-pay | Admitting: Internal Medicine

## 2016-04-02 ENCOUNTER — Other Ambulatory Visit: Payer: Self-pay | Admitting: Vascular Surgery

## 2016-04-02 ENCOUNTER — Other Ambulatory Visit: Payer: Self-pay

## 2016-04-02 ENCOUNTER — Ambulatory Visit
Admission: RE | Admit: 2016-04-02 | Discharge: 2016-04-02 | Disposition: A | Discharge status: Home / Self Care | Payer: Medicare Other | Source: Ambulatory Visit | Attending: Radiation Oncology | Admitting: Radiation Oncology

## 2016-04-02 ENCOUNTER — Inpatient Hospital Stay (HOSPITAL_BASED_OUTPATIENT_CLINIC_OR_DEPARTMENT_OTHER): Payer: Medicare Other | Admitting: Internal Medicine

## 2016-04-02 ENCOUNTER — Ambulatory Visit: Payer: Medicare Other

## 2016-04-02 VITALS — BP 101/67 | HR 97 | Temp 96.8°F | Resp 15 | Ht 68.0 in | Wt 158.8 lb

## 2016-04-02 DIAGNOSIS — C670 Malignant neoplasm of trigone of bladder: Secondary | ICD-10-CM | POA: Diagnosis not present

## 2016-04-02 DIAGNOSIS — N184 Chronic kidney disease, stage 4 (severe): Secondary | ICD-10-CM

## 2016-04-02 DIAGNOSIS — I129 Hypertensive chronic kidney disease with stage 1 through stage 4 chronic kidney disease, or unspecified chronic kidney disease: Secondary | ICD-10-CM

## 2016-04-02 DIAGNOSIS — N179 Acute kidney failure, unspecified: Secondary | ICD-10-CM | POA: Diagnosis not present

## 2016-04-02 DIAGNOSIS — D631 Anemia in chronic kidney disease: Secondary | ICD-10-CM

## 2016-04-02 DIAGNOSIS — Z79899 Other long term (current) drug therapy: Secondary | ICD-10-CM

## 2016-04-02 DIAGNOSIS — N189 Chronic kidney disease, unspecified: Secondary | ICD-10-CM

## 2016-04-02 LAB — CBC WITH DIFFERENTIAL/PLATELET
BASOS ABS: 0 10*3/uL (ref 0–0.1)
BASOS PCT: 1 %
Eosinophils Absolute: 0.2 10*3/uL (ref 0–0.7)
Eosinophils Relative: 3 %
HEMATOCRIT: 25.4 % — AB (ref 35.0–47.0)
HEMOGLOBIN: 8.4 g/dL — AB (ref 12.0–16.0)
LYMPHS PCT: 21 %
Lymphs Abs: 1.7 10*3/uL (ref 1.0–3.6)
MCH: 27.2 pg (ref 26.0–34.0)
MCHC: 33.2 g/dL (ref 32.0–36.0)
MCV: 81.8 fL (ref 80.0–100.0)
MONO ABS: 0.9 10*3/uL (ref 0.2–0.9)
MONOS PCT: 10 %
NEUTROS ABS: 5.5 10*3/uL (ref 1.4–6.5)
NEUTROS PCT: 65 %
Platelets: 305 10*3/uL (ref 150–440)
RBC: 3.1 MIL/uL — ABNORMAL LOW (ref 3.80–5.20)
RDW: 16.7 % — AB (ref 11.5–14.5)
WBC: 8.4 10*3/uL (ref 3.6–11.0)

## 2016-04-02 LAB — COMPREHENSIVE METABOLIC PANEL
ALBUMIN: 3 g/dL — AB (ref 3.5–5.0)
ALK PHOS: 73 U/L (ref 38–126)
ALT: 10 U/L — ABNORMAL LOW (ref 14–54)
ANION GAP: 8 (ref 5–15)
AST: 16 U/L (ref 15–41)
BILIRUBIN TOTAL: 0.5 mg/dL (ref 0.3–1.2)
BUN: 36 mg/dL — AB (ref 6–20)
CALCIUM: 8.6 mg/dL — AB (ref 8.9–10.3)
CO2: 21 mmol/L — ABNORMAL LOW (ref 22–32)
Chloride: 102 mmol/L (ref 101–111)
Creatinine, Ser: 3.43 mg/dL — ABNORMAL HIGH (ref 0.44–1.00)
GFR calc Af Amer: 14 mL/min — ABNORMAL LOW (ref >60)
GFR, EST NON AFRICAN AMERICAN: 12 mL/min — AB (ref >60)
Glucose, Bld: 127 mg/dL — ABNORMAL HIGH (ref 65–99)
POTASSIUM: 3.2 mmol/L — AB (ref 3.5–5.1)
Sodium: 131 mmol/L — ABNORMAL LOW (ref 135–145)
TOTAL PROTEIN: 8.7 g/dL — AB (ref 6.5–8.1)

## 2016-04-02 NOTE — Progress Notes (Signed)
Pt is at [facility].  Pt reports vomiting 1 time after dinner last night.  Pt reports that she has had diarrhea sometimes for than 6 times a day.  Pt reports right knee pain 8/10 on pain scale. Pt has had weight loss.

## 2016-04-02 NOTE — Progress Notes (Signed)
[place] [facility] OFFICE PROGRESS NOTE  Patient Care Team: Madelyn Brunner, MD as PCP - General (Internal Medicine)  No matching staging information was found for the patient.   Oncology History    #AUG 2017 TURBT- [Dr.Brandon] Large nodular proximally 5 cm bladder mass involving the left hemitrigone extending up the left lateral bladder wall, 2 discrete nodules with bridge of abnormal tissue; Tumor clinically T2. TURBT- Tumor invades muscularis propria [detrusor muscle]. NOT candidate for cystectomy.  # SEP 2017- RT- GEM weekly.  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  female pleasant patient above history of Recently diagnosed muscle invasive bladder cancer; not a candidate for cystectomy is here for follow-up. Patient started radiation last week.  Unfortunately port was not placed as recommended. Patient continues to be in rehabilitation likely be discharged home tomorrow.  Denies any abdominal pain. Denies any unusual shortness of breath or cough. No fever no chills. Complains of pain in the left ankle. Denies any diarrhea.  REVIEW OF SYSTEMS:  A complete 10 point review of system is done which is negative except mentioned above/history of present illness.   PAST MEDICAL HISTORY :  Past Medical History:  Diagnosis Date  . Anemia associated with chronic renal failure   . Arthritis   . CKD (chronic kidney disease)   . COPD (chronic obstructive pulmonary disease) ([place])   . Diabetes mellitus without complication ([place])   . Hypertension   . Sleep apnea    uses cpap  . Urinary obstruction 01/2016    PAST SURGICAL HISTORY :   Past Surgical History:  Procedure Laterality Date  . ABDOMINAL HYSTERECTOMY    . CYSTOSCOPY W/ RETROGRADES Bilateral 02/17/2016   Procedure: CYSTOSCOPY  WITH RETROGRADE PYELOGRAM;  Surgeon: Hollice Espy, MD;  Location: [facility] ORS;  Service: Urology;  Laterality: Bilateral;  . CYSTOSCOPY WITH STENT PLACEMENT Left 01/21/2016   Procedure: CYSTOSCOPY WITH double J STENT PLACEMENT;  Surgeon: Franchot Gallo, MD;  Location: [facility] ORS;  Service: Urology;  Laterality: Left;  . KIDNEY SURGERY  01/21/2016   IR NEPHROSTOMY PLACEMENT LEFT   . ROTATOR CUFF REPAIR     left  . TRANSURETHRAL RESECTION OF BLADDER TUMOR N/A 02/17/2016   Procedure: TRANSURETHRAL RESECTION OF BLADDER TUMOR (TURBT)-LARGE;  Surgeon: Hollice Espy, MD;  Location: [facility] ORS;  Service: Urology;  Laterality: N/A;  . URETEROSCOPY Left 02/17/2016   Procedure: URETEROSCOPY;  Surgeon: Hollice Espy, MD;  Location: [facility] OR[facility];  Service: Urology;  Laterality: Left;    FAMILY HISTORY :   Family History  Problem Relation Age of Onset  . Diabetes Mother   . Cancer Father   . Breast cancer Neg Hx   . Kidney disease Neg Hx     SOCIAL HISTORY:   Social History  Substance Use Topics  . Smoking status: Former Smoker    Quit date: 07/20/1993  . Smokeless tobacco: Never Used     Comment: 01/22/2016   "  quit smoking many years ago "  . Alcohol use No    ALLERGIES:  is allergic to percocet [oxycodone-acetaminophen].  MEDICATIONS:  Current Outpatient Prescriptions  Medication Sig Dispense Refill  . Albuterol Sulfate (PROAIR RESPICLICK) 627 (90 Base) MCG/ACT AEPB Inhale 2 puffs into the  lungs 4 (four) times daily as needed (wheezing).     Marland Kitchen atorvastatin (LIPITOR) 40 MG tablet Take 40 mg by mouth daily.    . bisacodyl (DULCOLAX) 10 MG suppository Place 1 suppository (10 mg total) rectally daily as needed for moderate constipation. 12 suppository 0  . Cholecalciferol (VITAMIN D-3) 1000 units CAPS Take 1 capsule by mouth every morning.    . dronabinol (MARINOL) 2.5 MG capsule Take 1 capsule (2.5 mg total) by mouth 2 (two) times daily before lunch and supper. 60 capsule 5  . famotidine (PEPCID)  20 MG tablet Take 1 tablet (20 mg total) by mouth daily. 30 tablet 0  . ferrous sulfate 325 (65 FE) MG tablet Take 1 tablet (325 mg total) by mouth 2 (two) times daily with a meal. 60 tablet 0  . folic acid (FOLVITE) 1 MG tablet Take 1 tablet (1 mg total) by mouth daily. 30 tablet 0  . gabapentin (NEURONTIN) 100 MG capsule Take 100 mg by mouth 2 (two) times daily.    Marland Kitchen HYDROcodone-acetaminophen (NORCO/VICODIN) 5-325 MG tablet Take 1-2 tablets by mouth every 6 (six) hours as needed for moderate pain. 15 tablet 0  . insulin aspart (NOVOLOG) 100 UNIT/ML injection Inject 0-15 Units into the skin 3 (three) times daily with meals. (Patient taking differently: Inject 0-15 Units into the skin 3 (three) times daily with meals. If BS <60 call MD, if BS is 70-120=0u, 121-150=2u, 151-200=3, 201-250=5u, 251-300=8u, 301-350=11u, 351-400=15u, if >450 call MD) 10 mL 0  . NON FORMULARY Place 1 Units into the nose at bedtime. CPAP Time of use 2100-0600    . Travoprost, BAK Free, (TRAVATAN) 0.004 % SOLN ophthalmic solution Place 1 drop into both eyes at bedtime.     No current facility-administered medications for this visit.     PHYSICAL EXAMINATION: ECOG PERFORMANCE STATUS: 2 - Symptomatic, <50% confined to bed  BP 101/67 (BP Location: Left Arm, Patient Position: Sitting)   Pulse 97   Temp (!) 96.8 F (36 C)   Resp 15   Ht 5' 8"  (1.727 m)   Wt 158 lb 12.8 oz (72 kg)   BMI 24.15 kg/m   Filed Weights   04/02/16 0916  Weight: 158 lb 12.8 oz (72 kg)    GENERAL: Well-nourished well-developed; Alert, no distress and comfortable.  With her husband. She is in a wheelchair. Obese. EYES: no pallor or icterus OROPHARYNX: no thrush or ulceration; good dentition  NECK: supple, no masses felt LYMPH:  no palpable lymphadenopathy in the cervical, axillary or inguinal regions LUNGS: clear to auscultation and  No wheeze or crackles HEART/CVS: regular rate & rhythm and no murmurs; No lower extremity  edema ABDOMEN:abdomen soft, non-tender and normal bowel sounds Musculoskeletal:no cyanosis of digits and no clubbing  PSYCH: alert & oriented x 3 with fluent speech NEURO: no focal motor/sensory deficits SKIN:  no rashes or significant lesions  LABORATORY DATA:  I have reviewed the data as listed    Component Value Date/Time   NA 131 (L) 04/02/2016 0905   NA 138 08/07/2012 0433   K 3.2 (L) 04/02/2016 0905   K 3.2 (L) 10/30/2014 1410   CL 102 04/02/2016 0905   CL 104 08/07/2012 0433   CO2 21 (L) 04/02/2016 0905   CO2 25 08/07/2012 0433   GLUCOSE 127 (H) 04/02/2016 0905   GLUCOSE 172 (H) 08/07/2012 0433   BUN 36 (H) 04/02/2016 0905   BUN 63 (H) 08/07/2012 0433   CREATININE 3.43 (H) 04/02/2016 4235  CREATININE 2.48 (H) 08/07/2012 0433   CALCIUM 8.6 (L) 04/02/2016 0905   CALCIUM 9.1 08/07/2012 0433   PROT 8.7 (H) 04/02/2016 0905   PROT 8.9 (H) 08/02/2012 0958   ALBUMIN 3.0 (L) 04/02/2016 0905   ALBUMIN 3.9 08/02/2012 0958   AST 16 04/02/2016 0905   AST 21 08/02/2012 0958   ALT 10 (L) 04/02/2016 0905   ALT 19 08/02/2012 0958   ALKPHOS 73 04/02/2016 0905   ALKPHOS 107 08/02/2012 0958   BILITOT 0.5 04/02/2016 0905   BILITOT 0.3 08/02/2012 0958   GFRNONAA 12 (L) 04/02/2016 0905   GFRNONAA 19 (L) 08/07/2012 0433   GFRAA 14 (L) 04/02/2016 0905   GFRAA 22 (L) 08/07/2012 0433    No results found for: SPEP, UPEP  Lab Results  Component Value Date   WBC 8.4 04/02/2016   NEUTROABS 5.5 04/02/2016   HGB 8.4 (L) 04/02/2016   HCT 25.4 (L) 04/02/2016   MCV 81.8 04/02/2016   PLT 305 04/02/2016      Chemistry      Component Value Date/Time   NA 131 (L) 04/02/2016 0905   NA 138 08/07/2012 0433   K 3.2 (L) 04/02/2016 0905   K 3.2 (L) 10/30/2014 1410   CL 102 04/02/2016 0905   CL 104 08/07/2012 0433   CO2 21 (L) 04/02/2016 0905   CO2 25 08/07/2012 0433   BUN 36 (H) 04/02/2016 0905   BUN 63 (H) 08/07/2012 0433   CREATININE 3.43 (H) 04/02/2016 0905   CREATININE 2.48 (H)  08/07/2012 0433      Component Value Date/Time   CALCIUM 8.6 (L) 04/02/2016 0905   CALCIUM 9.1 08/07/2012 0433   ALKPHOS 73 04/02/2016 0905   ALKPHOS 107 08/02/2012 0958   AST 16 04/02/2016 0905   AST 21 08/02/2012 0958   ALT 10 (L) 04/02/2016 0905   ALT 19 08/02/2012 0958   BILITOT 0.5 04/02/2016 0905   BILITOT 0.3 08/02/2012 0958       RADIOGRAPHIC STUDIES: I have personally reviewed the radiological images as listed and agreed with the findings in the report. No results found.   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 Started radiation last week; plan adding gemcitabine when the port is placed.  # Patient family declines chemotherapy through IV. From today  # Poor IV access- port not placed- as patient/family not reachable by vascular surgery. 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 Monitor for now  # Anemia- sec to CKD hold epo for now; will check Iron studies.    Orders Placed This Encounter  Procedures  . CBC with Differential    Standing Status:   Future    Standing Expiration Date:   04/02/2017  . Basic metabolic panel    Standing Status:   Future    Standing Expiration Date:   04/02/2017  . Comprehensive metabolic panel    Standing Status:   Future    Standing Expiration Date:   04/02/2017  . CBC with Differential    Standing Status:   Future    Standing Expiration Date:   04/02/2017  . Iron and TIBC    Standing Status:   Future    Standing Expiration Date:   05/07/2017  . Ferritin    Standing Status:   Future    Standing Expiration Date:  05/07/2017   All questions were answered. The patient knows to call the clinic with any problems, questions or concerns.      Cammie Sickle, MD 04/02/2016 12:26 PM

## 2016-04-02 NOTE — Assessment & Plan Note (Addendum)
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 Started radiation last week; plan adding gemcitabine when the port is placed.  # Patient family declines chemotherapy through IV. From today  # Poor IV access- port not placed- as patient/family not reachable by vascular surgery. 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 Monitor for now  # Anemia- sec to CKD hold epo for now; will check Iron studies.

## 2016-04-02 NOTE — Progress Notes (Signed)
Contacted [facility]-pt had not had port placed. Order faxed on 03/20/16 to Dr. Bunnie Domino office. According to [place] @ [facility]- mulitple attempts have been made to reach patient's caregiver- unsuccessful. she has been unable to leave any vm as vm not set up.  I personally spoke with patient's husband-pt currently @ [facility]. He doesn't have vm set up at residence. He spends most of his time out of the home sitting with wife @ [facility].  RN waiting on callback from [place] @ [facility]- pt declines chemo today until port is placed.  Will r/s chemo to next week lab/gemar only (cbc, metb) and lab/md/gemar in 2 weeks (cbc, metc).    Patient provided with written information on pre-port placement instructions.  These instructions were faxed directly from [facility].  NPO 8 hrs prior to provider. Pt instructed to take routine meds morning of procedure with sips of water only. Arrive at 1130am for her procedure on 04/07/16.Marland Kitchen Pt instructed to bring all med bottles and a list of her meds. Pt does not take metformin. currently states that she is not using any insulin. She is not taking any blood thinners.  She understands that she must bring a driver to this appointment.  I explained to husband that pt may be at [facility] for 2-4 hrs on this day. Pt and husband gave verbal understanding.

## 2016-04-03 ENCOUNTER — Ambulatory Visit
Admission: RE | Admit: 2016-04-03 | Discharge: 2016-04-03 | Disposition: A | Discharge status: Home / Self Care | Payer: Medicare Other | Source: Ambulatory Visit | Attending: Radiation Oncology | Admitting: Radiation Oncology

## 2016-04-03 DIAGNOSIS — C670 Malignant neoplasm of trigone of bladder: Secondary | ICD-10-CM | POA: Diagnosis not present

## 2016-04-06 ENCOUNTER — Encounter: Payer: Self-pay | Admitting: Urology

## 2016-04-06 ENCOUNTER — Ambulatory Visit: Payer: Medicare Other

## 2016-04-06 DIAGNOSIS — C670 Malignant neoplasm of trigone of bladder: Secondary | ICD-10-CM | POA: Diagnosis not present

## 2016-04-07 ENCOUNTER — Ambulatory Visit
Admission: RE | Admit: 2016-04-07 | Discharge: 2016-04-07 | Disposition: A | Discharge status: Home / Self Care | Payer: Medicare Other | Source: Ambulatory Visit | Attending: Vascular Surgery | Admitting: Vascular Surgery

## 2016-04-07 ENCOUNTER — Encounter: Payer: Self-pay | Admitting: Vascular Surgery

## 2016-04-07 ENCOUNTER — Encounter
Admission: RE | Disposition: A | Discharge status: Home / Self Care | Payer: Self-pay | Source: Ambulatory Visit | Attending: Vascular Surgery

## 2016-04-07 ENCOUNTER — Ambulatory Visit
Admission: RE | Admit: 2016-04-07 | Discharge: 2016-04-07 | Disposition: A | Discharge status: Home / Self Care | Payer: Medicare Other | Source: Ambulatory Visit | Attending: Radiation Oncology | Admitting: Radiation Oncology

## 2016-04-07 ENCOUNTER — Other Ambulatory Visit: Payer: Self-pay | Admitting: *Deleted

## 2016-04-07 DIAGNOSIS — Z809 Family history of malignant neoplasm, unspecified: Secondary | ICD-10-CM | POA: Insufficient documentation

## 2016-04-07 DIAGNOSIS — M1990 Unspecified osteoarthritis, unspecified site: Secondary | ICD-10-CM | POA: Insufficient documentation

## 2016-04-07 DIAGNOSIS — Z90710 Acquired absence of both cervix and uterus: Secondary | ICD-10-CM | POA: Diagnosis not present

## 2016-04-07 DIAGNOSIS — Z794 Long term (current) use of insulin: Secondary | ICD-10-CM | POA: Insufficient documentation

## 2016-04-07 DIAGNOSIS — C679 Malignant neoplasm of bladder, unspecified: Secondary | ICD-10-CM | POA: Insufficient documentation

## 2016-04-07 DIAGNOSIS — Z833 Family history of diabetes mellitus: Secondary | ICD-10-CM | POA: Diagnosis not present

## 2016-04-07 DIAGNOSIS — D631 Anemia in chronic kidney disease: Secondary | ICD-10-CM | POA: Diagnosis not present

## 2016-04-07 DIAGNOSIS — Z7982 Long term (current) use of aspirin: Secondary | ICD-10-CM | POA: Diagnosis not present

## 2016-04-07 DIAGNOSIS — Z960 Presence of urogenital implants: Secondary | ICD-10-CM | POA: Diagnosis not present

## 2016-04-07 DIAGNOSIS — E1122 Type 2 diabetes mellitus with diabetic chronic kidney disease: Secondary | ICD-10-CM | POA: Insufficient documentation

## 2016-04-07 DIAGNOSIS — N184 Chronic kidney disease, stage 4 (severe): Secondary | ICD-10-CM | POA: Diagnosis not present

## 2016-04-07 DIAGNOSIS — G4730 Sleep apnea, unspecified: Secondary | ICD-10-CM | POA: Diagnosis not present

## 2016-04-07 DIAGNOSIS — Z87891 Personal history of nicotine dependence: Secondary | ICD-10-CM | POA: Insufficient documentation

## 2016-04-07 DIAGNOSIS — Z885 Allergy status to narcotic agent status: Secondary | ICD-10-CM | POA: Insufficient documentation

## 2016-04-07 DIAGNOSIS — I129 Hypertensive chronic kidney disease with stage 1 through stage 4 chronic kidney disease, or unspecified chronic kidney disease: Secondary | ICD-10-CM | POA: Insufficient documentation

## 2016-04-07 DIAGNOSIS — C670 Malignant neoplasm of trigone of bladder: Secondary | ICD-10-CM | POA: Diagnosis not present

## 2016-04-07 HISTORY — PX: PERIPHERAL VASCULAR CATHETERIZATION: SHX172C

## 2016-04-07 LAB — GLUCOSE, CAPILLARY: Glucose-Capillary: 115 mg/dL — ABNORMAL HIGH (ref 65–99)

## 2016-04-07 SURGERY — PORTA CATH INSERTION
Anesthesia: Moderate Sedation

## 2016-04-07 MED ORDER — DEXTROSE 5 % IV SOLN
1.5000 g | INTRAVENOUS | Status: AC
  Administered 2016-04-07: 1.5 g via INTRAVENOUS

## 2016-04-07 MED ORDER — FENTANYL CITRATE (PF) 100 MCG/2ML IJ SOLN
INTRAMUSCULAR | Status: AC
  Filled 2016-04-07: qty 2

## 2016-04-07 MED ORDER — MIDAZOLAM HCL 5 MG/5ML IJ SOLN
INTRAMUSCULAR | Status: AC
  Filled 2016-04-07: qty 5

## 2016-04-07 MED ORDER — SODIUM CHLORIDE 0.9 % IV SOLN
INTRAVENOUS | Status: DC
  Administered 2016-04-07: 12:00:00 via INTRAVENOUS

## 2016-04-07 MED ORDER — ONDANSETRON HCL 4 MG/2ML IJ SOLN: 4.0000 mg | Freq: Four times a day (QID) | INTRAMUSCULAR | Status: DC | PRN

## 2016-04-07 MED ORDER — LIDOCAINE-EPINEPHRINE (PF) 1 %-1:200000 IJ SOLN
INTRAMUSCULAR | Status: AC
  Filled 2016-04-07: qty 30

## 2016-04-07 MED ORDER — SODIUM CHLORIDE 0.9 % IR SOLN
Freq: Once | Status: DC
  Filled 2016-04-07: qty 2

## 2016-04-07 MED ORDER — HYDROMORPHONE HCL 1 MG/ML IJ SOLN: 1.0000 mg | Freq: Once | INTRAMUSCULAR | Status: DC

## 2016-04-07 MED ORDER — MIDAZOLAM HCL 2 MG/2ML IJ SOLN
INTRAMUSCULAR | Status: DC | PRN
  Administered 2016-04-07: 2 mg via INTRAVENOUS
  Administered 2016-04-07: 1 mg via INTRAVENOUS

## 2016-04-07 MED ORDER — FENTANYL CITRATE (PF) 100 MCG/2ML IJ SOLN
INTRAMUSCULAR | Status: DC | PRN
  Administered 2016-04-07 (×2): 50 ug via INTRAVENOUS

## 2016-04-07 SURGICAL SUPPLY — 9 items
BAG DECANTER STRL (MISCELLANEOUS) ×3 IMPLANT
DRAPE INCISE IOBAN 66X45 STRL (DRAPES) ×3 IMPLANT
KIT PORT POWER 8FR ISP CVUE (Catheter) ×3 IMPLANT
PACK ANGIOGRAPHY (CUSTOM PROCEDURE TRAY) ×3 IMPLANT
PREP CHG 10.5 TEAL (MISCELLANEOUS) ×3 IMPLANT
SUT MNCRL AB 4-0 PS2 18 (SUTURE) ×3 IMPLANT
SUT PROLENE 0 CT 1 30 (SUTURE) ×3 IMPLANT
SUTURE VIC 3-0 (SUTURE) ×3 IMPLANT
TOWEL OR 17X26 4PK STRL BLUE (TOWEL DISPOSABLE) ×3 IMPLANT

## 2016-04-07 NOTE — H&P (Signed)
[facility] History & Physical Update  The patient was interviewed and re-examined.  The patient's previous History and Physical has been reviewed and is unchanged.  There is no change in the plan of care. We plan to proceed with the scheduled procedure.  Shawnmichael Parenteau, Dolores Lory, MD  04/07/2016, 12:35 PM

## 2016-04-07 NOTE — Op Note (Signed)
OPERATIVE NOTE   PROCEDURE: 1. Placement of a right IJ Infuse-a-Port  PRE-OPERATIVE DIAGNOSIS: Bladder carcinoma  POST-OPERATIVE DIAGNOSIS: Same  SURGEON: Katha Cabal M.D.  ANESTHESIA: Conscious sedation combined with 1% lidocaine with epinephrine  ESTIMATED BLOOD LOSS: Minimal   FINDING(S): 1.  Patent vein  SPECIMEN(S): None  INDICATIONS:   Barbara [name] is a 75 y.o. female who presents with bladder carcinoma and will therefore undergo chemotherapy. She requires appropriate IV access and therefore Infuse-a-Port is being placed. The risks and benefits of been reviewed patient has agreed to proceed..  DESCRIPTION: After obtaining full informed written consent, the patient was brought back to the special procedure suite and placed in the supine position. The patient's right neck and chest wall are prepped and draped in sterile fashion. Appropriate timeout was called.  Ultrasound is placed in a sterile sleeve, ultrasound is utilized to avoid vascular injury as well as secondary to lack of appropriate landmarks. The right internal jugular vein is identified. It is echolucent and homogeneous as well as easily compressible indicating patency. 1% lidocaine is infiltrated into the soft tissue at the base of the neck as well as on the chest wall.  Under direct ultrasound visualization Seldinger needle is inserted into the right internal jugular vein. J-wire is advanced under fluoroscopic guidance. A small counterincision was created at the wire insertion site. A transverse incision is created 2 fingerbreadths below the scapula and a pocket is fashioned using both blunt and sharp dissection. The pocket is tested for appropriate size with the hub of the Infuse-a-Port. The tunneling device is then used to pull the intravascular portion of the catheter from the pocket to the neck counterincision.  Dilator and peel-away sheath were then inserted over the wire and the wire is removed. Catheter  is then advanced into the venous system without difficulty. Peel-away sheath was then removed.  Catheter is then positioned under fluoroscopic guidance at the atrial caval junction. It is then transected connected to the hub and the hope is slipped into the subcutaneous pocket on the chest wall. The hub was then accessed percutaneously and aspirates easily and flushes well and is flushed with 30 cc of heparinized saline. The pocket incision is then closed in layers using interrupted 3-0 Vicryl for the subcutaneous tissues and 4-0 Monocryl subcuticular for skin closure. Dermabond is applied. The neck counterincision was closed with 4-0 Monocryl subcuticular and Dermabond as well.  The patient tolerated the procedure well and there were no immediate complications.  COMPLICATIONS: None  CONDITION: Unchanged  Katha Cabal M.D. [facility] Office: [phone]   04/07/2016, 1:25 PM

## 2016-04-07 NOTE — Discharge Instructions (Signed)

## 2016-04-08 ENCOUNTER — Other Ambulatory Visit: Payer: Self-pay | Admitting: *Deleted

## 2016-04-08 ENCOUNTER — Ambulatory Visit
Admission: RE | Admit: 2016-04-08 | Discharge: 2016-04-08 | Disposition: A | Discharge status: Home / Self Care | Payer: Medicare Other | Source: Ambulatory Visit | Attending: Radiation Oncology | Admitting: Radiation Oncology

## 2016-04-08 ENCOUNTER — Other Ambulatory Visit: Payer: Self-pay | Admitting: Internal Medicine

## 2016-04-08 DIAGNOSIS — C670 Malignant neoplasm of trigone of bladder: Secondary | ICD-10-CM | POA: Diagnosis not present

## 2016-04-08 DIAGNOSIS — Z95828 Presence of other vascular implants and grafts: Secondary | ICD-10-CM

## 2016-04-08 MED ORDER — MIRABEGRON ER 25 MG PO TB24: 25.0000 mg | ORAL_TABLET | Freq: Every day | ORAL | 1 refills | Status: DC

## 2016-04-08 MED ORDER — LIDOCAINE-PRILOCAINE 2.5-2.5 % EX CREA: 1.0000 "application " | TOPICAL_CREAM | CUTANEOUS | 3 refills | Status: DC | PRN

## 2016-04-09 ENCOUNTER — Ambulatory Visit
Admission: RE | Admit: 2016-04-09 | Discharge: 2016-04-09 | Disposition: A | Discharge status: Home / Self Care | Payer: Medicare Other | Source: Ambulatory Visit | Attending: Radiation Oncology | Admitting: Radiation Oncology

## 2016-04-09 ENCOUNTER — Inpatient Hospital Stay: Payer: Medicare Other

## 2016-04-09 VITALS — BP 125/65 | HR 71 | Temp 96.5°F | Resp 16

## 2016-04-09 DIAGNOSIS — C670 Malignant neoplasm of trigone of bladder: Secondary | ICD-10-CM | POA: Diagnosis not present

## 2016-04-09 DIAGNOSIS — D631 Anemia in chronic kidney disease: Secondary | ICD-10-CM

## 2016-04-09 DIAGNOSIS — N189 Chronic kidney disease, unspecified: Secondary | ICD-10-CM

## 2016-04-09 LAB — BASIC METABOLIC PANEL
Anion gap: 6 (ref 5–15)
BUN: 41 mg/dL — AB (ref 6–20)
CALCIUM: 8.6 mg/dL — AB (ref 8.9–10.3)
CO2: 20 mmol/L — ABNORMAL LOW (ref 22–32)
CREATININE: 2.3 mg/dL — AB (ref 0.44–1.00)
Chloride: 109 mmol/L (ref 101–111)
GFR calc Af Amer: 23 mL/min — ABNORMAL LOW (ref >60)
GFR, EST NON AFRICAN AMERICAN: 19 mL/min — AB (ref >60)
Glucose, Bld: 114 mg/dL — ABNORMAL HIGH (ref 65–99)
Potassium: 3.5 mmol/L (ref 3.5–5.1)
SODIUM: 135 mmol/L (ref 135–145)

## 2016-04-09 LAB — CBC WITH DIFFERENTIAL/PLATELET
Basophils Absolute: 0 10*3/uL (ref 0–0.1)
Basophils Relative: 0 %
EOS ABS: 0.4 10*3/uL (ref 0–0.7)
EOS PCT: 4 %
HCT: 23.5 % — ABNORMAL LOW (ref 35.0–47.0)
Hemoglobin: 7.6 g/dL — ABNORMAL LOW (ref 12.0–16.0)
LYMPHS ABS: 2.1 10*3/uL (ref 1.0–3.6)
Lymphocytes Relative: 24 %
MCH: 26.4 pg (ref 26.0–34.0)
MCHC: 32.2 g/dL (ref 32.0–36.0)
MCV: 82.1 fL (ref 80.0–100.0)
MONOS PCT: 7 %
Monocytes Absolute: 0.6 10*3/uL (ref 0.2–0.9)
Neutro Abs: 5.8 10*3/uL (ref 1.4–6.5)
Neutrophils Relative %: 65 %
PLATELETS: 268 10*3/uL (ref 150–440)
RBC: 2.86 MIL/uL — ABNORMAL LOW (ref 3.80–5.20)
RDW: 16.9 % — ABNORMAL HIGH (ref 11.5–14.5)
WBC: 9 10*3/uL (ref 3.6–11.0)

## 2016-04-09 LAB — IRON AND TIBC
IRON: 46 ug/dL (ref 28–170)
Saturation Ratios: 26 % (ref 10.4–31.8)
TIBC: 175 ug/dL — ABNORMAL LOW (ref 250–450)
UIBC: 129 ug/dL

## 2016-04-09 LAB — FERRITIN: Ferritin: 548 ng/mL — ABNORMAL HIGH (ref 11–307)

## 2016-04-09 MED ORDER — PROCHLORPERAZINE MALEATE 10 MG PO TABS
10.0000 mg | ORAL_TABLET | Freq: Once | ORAL | Status: AC
  Administered 2016-04-09: 10 mg via ORAL
  Filled 2016-04-09: qty 1

## 2016-04-09 MED ORDER — SODIUM CHLORIDE 0.9 % IV SOLN
1500.0000 mg | Freq: Once | INTRAVENOUS | Status: AC
  Administered 2016-04-09: 1482.8897 mg via INTRAVENOUS
  Filled 2016-04-09: qty 28.48

## 2016-04-09 MED ORDER — SODIUM CHLORIDE 0.9 % IV SOLN
Freq: Once | INTRAVENOUS | Status: AC
  Administered 2016-04-09: 12:00:00 via INTRAVENOUS
  Filled 2016-04-09: qty 1000

## 2016-04-09 MED ORDER — HEPARIN SOD (PORK) LOCK FLUSH 100 UNIT/ML IV SOLN
500.0000 [IU] | Freq: Once | INTRAVENOUS | Status: AC | PRN
  Administered 2016-04-09: 500 [IU]
  Filled 2016-04-09: qty 5

## 2016-04-10 ENCOUNTER — Ambulatory Visit: Payer: Medicare Other

## 2016-04-10 DIAGNOSIS — C670 Malignant neoplasm of trigone of bladder: Secondary | ICD-10-CM | POA: Diagnosis not present

## 2016-04-13 ENCOUNTER — Ambulatory Visit
Admission: RE | Admit: 2016-04-13 | Discharge: 2016-04-13 | Disposition: A | Discharge status: Home / Self Care | Payer: Medicare Other | Source: Ambulatory Visit | Attending: Radiation Oncology | Admitting: Radiation Oncology

## 2016-04-13 DIAGNOSIS — C670 Malignant neoplasm of trigone of bladder: Secondary | ICD-10-CM | POA: Diagnosis not present

## 2016-04-14 ENCOUNTER — Ambulatory Visit
Admission: RE | Admit: 2016-04-14 | Discharge: 2016-04-14 | Disposition: A | Discharge status: Home / Self Care | Payer: Medicare Other | Source: Ambulatory Visit | Attending: Radiation Oncology | Admitting: Radiation Oncology

## 2016-04-14 ENCOUNTER — Other Ambulatory Visit: Payer: Self-pay | Admitting: *Deleted

## 2016-04-14 MED ORDER — CIPROFLOXACIN HCL 250 MG PO TABS: 250.0000 mg | ORAL_TABLET | Freq: Two times a day (BID) | ORAL | 0 refills | Status: DC

## 2016-04-15 ENCOUNTER — Ambulatory Visit: Payer: Medicare Other

## 2016-04-16 ENCOUNTER — Inpatient Hospital Stay: Payer: Medicare Other

## 2016-04-16 ENCOUNTER — Ambulatory Visit: Payer: Medicare Other

## 2016-04-16 ENCOUNTER — Inpatient Hospital Stay: Payer: Medicare Other | Admitting: Internal Medicine

## 2016-04-17 ENCOUNTER — Emergency Department: Payer: Medicare Other

## 2016-04-17 ENCOUNTER — Other Ambulatory Visit: Payer: Self-pay

## 2016-04-17 ENCOUNTER — Ambulatory Visit: Payer: Medicare Other

## 2016-04-17 ENCOUNTER — Inpatient Hospital Stay
Admission: EM | Admit: 2016-04-17 | Discharge: 2016-04-20 | DRG: 871 | Disposition: A | Discharge status: Home Health | Payer: Medicare Other | Attending: Internal Medicine | Admitting: Internal Medicine

## 2016-04-17 DIAGNOSIS — Z515 Encounter for palliative care: Secondary | ICD-10-CM | POA: Diagnosis present

## 2016-04-17 DIAGNOSIS — E119 Type 2 diabetes mellitus without complications: Secondary | ICD-10-CM

## 2016-04-17 DIAGNOSIS — R262 Difficulty in walking, not elsewhere classified: Secondary | ICD-10-CM

## 2016-04-17 DIAGNOSIS — Z79891 Long term (current) use of opiate analgesic: Secondary | ICD-10-CM

## 2016-04-17 DIAGNOSIS — Z90710 Acquired absence of both cervix and uterus: Secondary | ICD-10-CM

## 2016-04-17 DIAGNOSIS — E669 Obesity, unspecified: Secondary | ICD-10-CM | POA: Diagnosis present

## 2016-04-17 DIAGNOSIS — G4733 Obstructive sleep apnea (adult) (pediatric): Secondary | ICD-10-CM | POA: Diagnosis present

## 2016-04-17 DIAGNOSIS — Z87891 Personal history of nicotine dependence: Secondary | ICD-10-CM

## 2016-04-17 DIAGNOSIS — R2681 Unsteadiness on feet: Secondary | ICD-10-CM

## 2016-04-17 DIAGNOSIS — N390 Urinary tract infection, site not specified: Secondary | ICD-10-CM | POA: Diagnosis present

## 2016-04-17 DIAGNOSIS — R680 Hypothermia, not associated with low environmental temperature: Secondary | ICD-10-CM | POA: Diagnosis present

## 2016-04-17 DIAGNOSIS — R4182 Altered mental status, unspecified: Secondary | ICD-10-CM

## 2016-04-17 DIAGNOSIS — A419 Sepsis, unspecified organism: Principal | ICD-10-CM

## 2016-04-17 DIAGNOSIS — M479 Spondylosis, unspecified: Secondary | ICD-10-CM | POA: Diagnosis present

## 2016-04-17 DIAGNOSIS — Z7951 Long term (current) use of inhaled steroids: Secondary | ICD-10-CM

## 2016-04-17 DIAGNOSIS — E162 Hypoglycemia, unspecified: Secondary | ICD-10-CM | POA: Diagnosis not present

## 2016-04-17 DIAGNOSIS — M6281 Muscle weakness (generalized): Secondary | ICD-10-CM

## 2016-04-17 DIAGNOSIS — Z9221 Personal history of antineoplastic chemotherapy: Secondary | ICD-10-CM

## 2016-04-17 DIAGNOSIS — R32 Unspecified urinary incontinence: Secondary | ICD-10-CM | POA: Diagnosis present

## 2016-04-17 DIAGNOSIS — I129 Hypertensive chronic kidney disease with stage 1 through stage 4 chronic kidney disease, or unspecified chronic kidney disease: Secondary | ICD-10-CM | POA: Diagnosis present

## 2016-04-17 DIAGNOSIS — Z809 Family history of malignant neoplasm, unspecified: Secondary | ICD-10-CM

## 2016-04-17 DIAGNOSIS — Z6827 Body mass index (BMI) 27.0-27.9, adult: Secondary | ICD-10-CM

## 2016-04-17 DIAGNOSIS — N1330 Unspecified hydronephrosis: Secondary | ICD-10-CM | POA: Diagnosis present

## 2016-04-17 DIAGNOSIS — N184 Chronic kidney disease, stage 4 (severe): Secondary | ICD-10-CM | POA: Diagnosis present

## 2016-04-17 DIAGNOSIS — Z79899 Other long term (current) drug therapy: Secondary | ICD-10-CM

## 2016-04-17 DIAGNOSIS — E1122 Type 2 diabetes mellitus with diabetic chronic kidney disease: Secondary | ICD-10-CM | POA: Diagnosis present

## 2016-04-17 DIAGNOSIS — Z66 Do not resuscitate: Secondary | ICD-10-CM | POA: Diagnosis present

## 2016-04-17 DIAGNOSIS — M159 Polyosteoarthritis, unspecified: Secondary | ICD-10-CM | POA: Diagnosis present

## 2016-04-17 DIAGNOSIS — E871 Hypo-osmolality and hyponatremia: Secondary | ICD-10-CM | POA: Diagnosis present

## 2016-04-17 DIAGNOSIS — Z885 Allergy status to narcotic agent status: Secondary | ICD-10-CM

## 2016-04-17 DIAGNOSIS — J449 Chronic obstructive pulmonary disease, unspecified: Secondary | ICD-10-CM | POA: Diagnosis present

## 2016-04-17 DIAGNOSIS — C670 Malignant neoplasm of trigone of bladder: Secondary | ICD-10-CM | POA: Diagnosis present

## 2016-04-17 DIAGNOSIS — E785 Hyperlipidemia, unspecified: Secondary | ICD-10-CM | POA: Diagnosis present

## 2016-04-17 DIAGNOSIS — R569 Unspecified convulsions: Secondary | ICD-10-CM

## 2016-04-17 DIAGNOSIS — E11649 Type 2 diabetes mellitus with hypoglycemia without coma: Secondary | ICD-10-CM | POA: Diagnosis present

## 2016-04-17 DIAGNOSIS — N170 Acute kidney failure with tubular necrosis: Secondary | ICD-10-CM | POA: Diagnosis present

## 2016-04-17 DIAGNOSIS — I2510 Atherosclerotic heart disease of native coronary artery without angina pectoris: Secondary | ICD-10-CM | POA: Diagnosis present

## 2016-04-17 DIAGNOSIS — N189 Chronic kidney disease, unspecified: Secondary | ICD-10-CM

## 2016-04-17 DIAGNOSIS — G9341 Metabolic encephalopathy: Secondary | ICD-10-CM | POA: Diagnosis present

## 2016-04-17 DIAGNOSIS — D631 Anemia in chronic kidney disease: Secondary | ICD-10-CM | POA: Diagnosis present

## 2016-04-17 HISTORY — DX: Chronic kidney disease, unspecified: N18.9

## 2016-04-17 LAB — COMPREHENSIVE METABOLIC PANEL
ALBUMIN: 2.5 g/dL — AB (ref 3.5–5.0)
ALT: 9 U/L — AB (ref 14–54)
ANION GAP: 12 (ref 5–15)
AST: 19 U/L (ref 15–41)
Alkaline Phosphatase: 60 U/L (ref 38–126)
BUN: 46 mg/dL — ABNORMAL HIGH (ref 6–20)
CALCIUM: 8.4 mg/dL — AB (ref 8.9–10.3)
CHLORIDE: 104 mmol/L (ref 101–111)
CO2: 14 mmol/L — ABNORMAL LOW (ref 22–32)
CREATININE: 6.42 mg/dL — AB (ref 0.44–1.00)
GFR, EST AFRICAN AMERICAN: 7 mL/min — AB (ref >60)
GFR, EST NON AFRICAN AMERICAN: 6 mL/min — AB (ref >60)
Glucose, Bld: 89 mg/dL (ref 65–99)
Potassium: 4.2 mmol/L (ref 3.5–5.1)
SODIUM: 130 mmol/L — AB (ref 135–145)
Total Bilirubin: 0.5 mg/dL (ref 0.3–1.2)
Total Protein: 7.9 g/dL (ref 6.5–8.1)

## 2016-04-17 LAB — PHOSPHORUS: Phosphorus: 4.7 mg/dL — ABNORMAL HIGH (ref 2.5–4.6)

## 2016-04-17 LAB — PREPARE RBC (CROSSMATCH)

## 2016-04-17 LAB — GLUCOSE, CAPILLARY
GLUCOSE-CAPILLARY: 91 mg/dL (ref 65–99)
GLUCOSE-CAPILLARY: 28 mg/dL — AB (ref 65–99)
GLUCOSE-CAPILLARY: 134 mg/dL — AB (ref 65–99)
GLUCOSE-CAPILLARY: 105 mg/dL — AB (ref 65–99)
GLUCOSE-CAPILLARY: 12 mg/dL — AB (ref 65–99)
GLUCOSE-CAPILLARY: 85 mg/dL (ref 65–99)
GLUCOSE-CAPILLARY: 84 mg/dL (ref 65–99)
GLUCOSE-CAPILLARY: 86 mg/dL (ref 65–99)
Glucose-Capillary: 123 mg/dL — ABNORMAL HIGH (ref 65–99)
Glucose-Capillary: 123 mg/dL — ABNORMAL HIGH (ref 65–99)
Glucose-Capillary: 161 mg/dL — ABNORMAL HIGH (ref 65–99)
Glucose-Capillary: 171 mg/dL — ABNORMAL HIGH (ref 65–99)
Glucose-Capillary: 19 mg/dL — CL (ref 65–99)
Glucose-Capillary: 35 mg/dL — CL (ref 65–99)
Glucose-Capillary: 37 mg/dL — CL (ref 65–99)
Glucose-Capillary: 43 mg/dL — CL (ref 65–99)
Glucose-Capillary: 49 mg/dL — ABNORMAL LOW (ref 65–99)
Glucose-Capillary: 49 mg/dL — ABNORMAL LOW (ref 65–99)
Glucose-Capillary: 59 mg/dL — ABNORMAL LOW (ref 65–99)
Glucose-Capillary: 68 mg/dL (ref 65–99)
Glucose-Capillary: 68 mg/dL (ref 65–99)
Glucose-Capillary: 83 mg/dL (ref 65–99)

## 2016-04-17 LAB — CBC
HCT: 19.5 % — ABNORMAL LOW (ref 35.0–47.0)
HCT: 20.7 % — ABNORMAL LOW (ref 35.0–47.0)
Hemoglobin: 6.1 g/dL — ABNORMAL LOW (ref 12.0–16.0)
Hemoglobin: 6.5 g/dL — ABNORMAL LOW (ref 12.0–16.0)
MCH: 26.3 pg (ref 26.0–34.0)
MCH: 26.4 pg (ref 26.0–34.0)
MCHC: 31.4 g/dL — ABNORMAL LOW (ref 32.0–36.0)
MCHC: 31.5 g/dL — ABNORMAL LOW (ref 32.0–36.0)
MCV: 83.8 fL (ref 80.0–100.0)
MCV: 83.9 fL (ref 80.0–100.0)
PLATELETS: 123 10*3/uL — AB (ref 150–440)
Platelets: 132 K/uL — ABNORMAL LOW (ref 150–440)
RBC: 2.32 MIL/uL — ABNORMAL LOW (ref 3.80–5.20)
RBC: 2.47 MIL/uL — ABNORMAL LOW (ref 3.80–5.20)
RDW: 16.8 % — AB (ref 11.5–14.5)
RDW: 16.8 % — ABNORMAL HIGH (ref 11.5–14.5)
WBC: 7.5 10*3/uL (ref 3.6–11.0)
WBC: 7.9 K/uL (ref 3.6–11.0)

## 2016-04-17 LAB — URINALYSIS COMPLETE WITH MICROSCOPIC (ARMC ONLY)
BILIRUBIN URINE: NEGATIVE
GLUCOSE, UA: 150 mg/dL — AB
KETONES UR: NEGATIVE mg/dL
NITRITE: NEGATIVE
PROTEIN: 100 mg/dL — AB
Specific Gravity, Urine: 1.008 (ref 1.005–1.030)
pH: 7 (ref 5.0–8.0)

## 2016-04-17 LAB — BASIC METABOLIC PANEL WITH GFR
Anion gap: 7 (ref 5–15)
BUN: 40 mg/dL — ABNORMAL HIGH (ref 6–20)
CO2: 16 mmol/L — ABNORMAL LOW (ref 22–32)
Calcium: 7.4 mg/dL — ABNORMAL LOW (ref 8.9–10.3)
Chloride: 109 mmol/L (ref 101–111)
Creatinine, Ser: 5.21 mg/dL — ABNORMAL HIGH (ref 0.44–1.00)
GFR calc Af Amer: 8 mL/min — ABNORMAL LOW
GFR calc non Af Amer: 7 mL/min — ABNORMAL LOW
Glucose, Bld: 76 mg/dL (ref 65–99)
Potassium: 4.1 mmol/L (ref 3.5–5.1)
Sodium: 132 mmol/L — ABNORMAL LOW (ref 135–145)

## 2016-04-17 LAB — CBC WITH DIFFERENTIAL/PLATELET
BASOS PCT: 0 %
Basophils Absolute: 0 10*3/uL (ref 0–0.1)
EOS ABS: 0 10*3/uL (ref 0–0.7)
Eosinophils Relative: 0 %
HEMATOCRIT: 22.3 % — AB (ref 35.0–47.0)
HEMOGLOBIN: 7.2 g/dL — AB (ref 12.0–16.0)
LYMPHS ABS: 0.3 10*3/uL — AB (ref 1.0–3.6)
Lymphocytes Relative: 4 %
MCH: 26.7 pg (ref 26.0–34.0)
MCHC: 32.1 g/dL (ref 32.0–36.0)
MCV: 83 fL (ref 80.0–100.0)
Monocytes Absolute: 0.2 10*3/uL (ref 0.2–0.9)
Monocytes Relative: 3 %
NEUTROS ABS: 6.8 10*3/uL — AB (ref 1.4–6.5)
NEUTROS PCT: 93 %
Platelets: 137 10*3/uL — ABNORMAL LOW (ref 150–440)
RBC: 2.69 MIL/uL — AB (ref 3.80–5.20)
RDW: 17.3 % — ABNORMAL HIGH (ref 11.5–14.5)
WBC: 7.3 10*3/uL (ref 3.6–11.0)

## 2016-04-17 LAB — MAGNESIUM
MAGNESIUM: 1.5 mg/dL — AB (ref 1.7–2.4)
Magnesium: 1.6 mg/dL — ABNORMAL LOW (ref 1.7–2.4)

## 2016-04-17 LAB — TROPONIN I: TROPONIN I: 0.03 ng/mL — AB (ref <0.03)

## 2016-04-17 LAB — LACTIC ACID, PLASMA
LACTIC ACID, VENOUS: 0.8 mmol/L (ref 0.5–1.9)
Lactic Acid, Venous: 1.3 mmol/L (ref 0.5–1.9)

## 2016-04-17 LAB — TSH: TSH: 1.283 u[IU]/mL (ref 0.350–4.500)

## 2016-04-17 LAB — ABO/RH: ABO/RH(D): O POS

## 2016-04-17 MED ORDER — ONDANSETRON HCL 4 MG/2ML IJ SOLN: 4.0000 mg | Freq: Four times a day (QID) | INTRAMUSCULAR | Status: DC | PRN

## 2016-04-17 MED ORDER — LORAZEPAM 2 MG/ML IJ SOLN
INTRAMUSCULAR | Status: AC
  Filled 2016-04-17: qty 1

## 2016-04-17 MED ORDER — BISACODYL 10 MG RE SUPP: 10.0000 mg | Freq: Every day | RECTAL | Status: DC | PRN

## 2016-04-17 MED ORDER — DEXTROSE 50 % IV SOLN
INTRAVENOUS | Status: AC
  Administered 2016-04-17: 50 mL
  Filled 2016-04-17: qty 50

## 2016-04-17 MED ORDER — SODIUM CHLORIDE 0.9 % IV BOLUS (SEPSIS)
1000.0000 mL | Freq: Once | INTRAVENOUS | Status: AC
  Administered 2016-04-17: 1000 mL via INTRAVENOUS

## 2016-04-17 MED ORDER — FERROUS SULFATE 325 (65 FE) MG PO TABS
325.0000 mg | ORAL_TABLET | Freq: Two times a day (BID) | ORAL | Status: DC
  Administered 2016-04-18 – 2016-04-20 (×5): 325 mg via ORAL
  Filled 2016-04-17 (×5): qty 1

## 2016-04-17 MED ORDER — ALBUTEROL SULFATE (2.5 MG/3ML) 0.083% IN NEBU: 2.5000 mg | INHALATION_SOLUTION | RESPIRATORY_TRACT | Status: DC | PRN

## 2016-04-17 MED ORDER — DEXTROSE 10 % IV SOLN
INTRAVENOUS | Status: DC
  Administered 2016-04-17 – 2016-04-18 (×5): via INTRAVENOUS

## 2016-04-17 MED ORDER — DEXTROSE 50 % IV SOLN
50.0000 mL | Freq: Once | INTRAVENOUS | Status: AC
  Administered 2016-04-17: 50 mL via INTRAVENOUS
  Filled 2016-04-17: qty 50

## 2016-04-17 MED ORDER — LORAZEPAM 2 MG/ML IJ SOLN
1.0000 mg | Freq: Once | INTRAMUSCULAR | Status: AC
  Administered 2016-04-17: 1 mg via INTRAVENOUS

## 2016-04-17 MED ORDER — SODIUM CHLORIDE 0.9 % IV BOLUS (SEPSIS)
500.0000 mL | Freq: Once | INTRAVENOUS | Status: AC
  Administered 2016-04-17: 500 mL via INTRAVENOUS

## 2016-04-17 MED ORDER — SODIUM CHLORIDE 0.9 % IV SOLN
1000.0000 mL | INTRAVENOUS | Status: DC
  Administered 2016-04-17: 1000 mL via INTRAVENOUS

## 2016-04-17 MED ORDER — FUROSEMIDE 10 MG/ML IJ SOLN
20.0000 mg | Freq: Once | INTRAMUSCULAR | Status: AC
  Administered 2016-04-17: 20 mg via INTRAVENOUS
  Filled 2016-04-17: qty 2

## 2016-04-17 MED ORDER — ONDANSETRON HCL 4 MG PO TABS: 4.0000 mg | ORAL_TABLET | Freq: Four times a day (QID) | ORAL | Status: DC | PRN

## 2016-04-17 MED ORDER — ATORVASTATIN CALCIUM 20 MG PO TABS
40.0000 mg | ORAL_TABLET | Freq: Every day | ORAL | Status: DC
  Administered 2016-04-17 – 2016-04-19 (×3): 40 mg via ORAL
  Filled 2016-04-17 (×3): qty 2

## 2016-04-17 MED ORDER — FOLIC ACID 1 MG PO TABS
1.0000 mg | ORAL_TABLET | Freq: Every day | ORAL | Status: DC
  Administered 2016-04-18 – 2016-04-20 (×3): 1 mg via ORAL
  Filled 2016-04-17 (×3): qty 1

## 2016-04-17 MED ORDER — MAGNESIUM SULFATE 2 GM/50ML IV SOLN
2.0000 g | Freq: Once | INTRAVENOUS | Status: AC
  Administered 2016-04-17: 2 g via INTRAVENOUS
  Filled 2016-04-17: qty 50

## 2016-04-17 MED ORDER — SODIUM CHLORIDE 0.9 % IV BOLUS (SEPSIS)
1000.0000 mL | Freq: Once | INTRAVENOUS | Status: AC
Start: 2016-04-17 — End: 2016-04-17
  Administered 2016-04-17: 1000 mL via INTRAVENOUS

## 2016-04-17 MED ORDER — DEXTROSE 50 % IV SOLN
INTRAVENOUS | Status: AC
  Administered 2016-04-17: 50 mL via INTRAVENOUS
  Filled 2016-04-17: qty 50

## 2016-04-17 MED ORDER — SODIUM CHLORIDE 0.9 % IV BOLUS (SEPSIS): 1000.0000 mL | Freq: Once | INTRAVENOUS | Status: DC

## 2016-04-17 MED ORDER — LORAZEPAM 2 MG/ML IJ SOLN: 1.0000 mg | INTRAMUSCULAR | Status: DC | PRN

## 2016-04-17 MED ORDER — DEXTROSE-NACL 5-0.9 % IV SOLN
INTRAVENOUS | Status: DC
  Administered 2016-04-17: 12:00:00 via INTRAVENOUS

## 2016-04-17 MED ORDER — DEXTROSE 50 % IV SOLN
1.0000 | Freq: Once | INTRAVENOUS | Status: AC
  Administered 2016-04-17: 50 mL via INTRAVENOUS

## 2016-04-17 MED ORDER — ALBUTEROL SULFATE 108 (90 BASE) MCG/ACT IN AEPB: 2.0000 | INHALATION_SPRAY | Freq: Four times a day (QID) | RESPIRATORY_TRACT | Status: DC | PRN

## 2016-04-17 MED ORDER — DEXTROSE 50 % IV SOLN
INTRAVENOUS | Status: AC
  Administered 2016-04-17: 20:00:00
  Filled 2016-04-17: qty 50

## 2016-04-17 MED ORDER — MAGNESIUM SULFATE 2 GM/50ML IV SOLN
2.0000 g | Freq: Once | INTRAVENOUS | Status: AC
  Administered 2016-04-18: 2 g via INTRAVENOUS
  Filled 2016-04-17 (×2): qty 50

## 2016-04-17 MED ORDER — DEXTROSE 5 % IV SOLN
1.0000 g | INTRAVENOUS | Status: DC
  Administered 2016-04-17 – 2016-04-19 (×3): 1 g via INTRAVENOUS
  Filled 2016-04-17 (×4): qty 10

## 2016-04-17 MED ORDER — DRONABINOL 2.5 MG PO CAPS
2.5000 mg | ORAL_CAPSULE | Freq: Two times a day (BID) | ORAL | Status: DC
  Administered 2016-04-18 – 2016-04-20 (×4): 2.5 mg via ORAL
  Filled 2016-04-17 (×5): qty 1

## 2016-04-17 MED ORDER — INSULIN ASPART 100 UNIT/ML ~~LOC~~ SOLN
0.0000 [IU] | Freq: Three times a day (TID) | SUBCUTANEOUS | Status: DC
  Administered 2016-04-18 – 2016-04-19 (×2): 1 [IU] via SUBCUTANEOUS
  Administered 2016-04-19: 2 [IU] via SUBCUTANEOUS
  Filled 2016-04-17 (×2): qty 1
  Filled 2016-04-17: qty 2

## 2016-04-17 MED ORDER — SODIUM CHLORIDE 0.9 % IV SOLN
Freq: Once | INTRAVENOUS | Status: AC
  Administered 2016-04-18: via INTRAVENOUS

## 2016-04-17 MED ORDER — DEXTROSE 50 % IV SOLN
1.0000 | Freq: Once | INTRAVENOUS | Status: AC
  Administered 2016-04-17 (×2): 50 mL via INTRAVENOUS

## 2016-04-17 MED ORDER — FAMOTIDINE 20 MG PO TABS
20.0000 mg | ORAL_TABLET | Freq: Every day | ORAL | Status: DC
  Administered 2016-04-18 – 2016-04-20 (×3): 20 mg via ORAL
  Filled 2016-04-17 (×3): qty 1

## 2016-04-17 MED ORDER — DEXTROSE 50 % IV SOLN
25.0000 g | Freq: Once | INTRAVENOUS | Status: AC
  Administered 2016-04-17: 25 g via INTRAVENOUS
  Filled 2016-04-17: qty 50

## 2016-04-17 MED ORDER — GABAPENTIN 100 MG PO CAPS
100.0000 mg | ORAL_CAPSULE | Freq: Two times a day (BID) | ORAL | Status: DC
  Administered 2016-04-17 – 2016-04-20 (×6): 100 mg via ORAL
  Filled 2016-04-17 (×6): qty 1

## 2016-04-17 MED ORDER — HEPARIN SODIUM (PORCINE) 5000 UNIT/ML IJ SOLN
5000.0000 [IU] | Freq: Three times a day (TID) | INTRAMUSCULAR | Status: DC
  Administered 2016-04-17 – 2016-04-20 (×8): 5000 [IU] via SUBCUTANEOUS
  Filled 2016-04-17 (×8): qty 1

## 2016-04-17 MED ORDER — DEXTROSE 50 % IV SOLN
1.0000 | Freq: Once | INTRAVENOUS | Status: AC
Start: 2016-04-17 — End: 2016-04-17
  Administered 2016-04-17: 50 mL via INTRAVENOUS

## 2016-04-17 MED ORDER — MIRABEGRON ER 25 MG PO TB24
25.0000 mg | ORAL_TABLET | Freq: Every day | ORAL | Status: DC
  Administered 2016-04-18 – 2016-04-20 (×3): 25 mg via ORAL
  Filled 2016-04-17 (×3): qty 1

## 2016-04-17 MED ORDER — DEXTROSE 50 % IV SOLN
INTRAVENOUS | Status: AC
  Filled 2016-04-17: qty 50

## 2016-04-17 MED ORDER — HYDROCODONE-ACETAMINOPHEN 5-325 MG PO TABS
1.0000 | ORAL_TABLET | Freq: Four times a day (QID) | ORAL | Status: DC | PRN
Start: 2016-04-17 — End: 2016-04-20

## 2016-04-17 MED ORDER — VANCOMYCIN HCL IN DEXTROSE 1-5 GM/200ML-% IV SOLN
1000.0000 mg | Freq: Once | INTRAVENOUS | Status: AC
  Administered 2016-04-17: 1000 mg via INTRAVENOUS
  Filled 2016-04-17: qty 200

## 2016-04-17 MED ORDER — PIPERACILLIN-TAZOBACTAM 3.375 G IVPB 30 MIN
3.3750 g | Freq: Once | INTRAVENOUS | Status: AC
  Administered 2016-04-17: 3.375 g via INTRAVENOUS
  Filled 2016-04-17: qty 50

## 2016-04-17 MED ORDER — INSULIN ASPART 100 UNIT/ML ~~LOC~~ SOLN: 0.0000 [IU] | Freq: Every day | SUBCUTANEOUS | Status: DC

## 2016-04-17 NOTE — ED Notes (Signed)
Pt transported to CT with RN

## 2016-04-17 NOTE — Progress Notes (Signed)
The pt's BS back up to 134 but decreased to 19 just now. BP is 87/46. Stat D50 2 amp, change to D10 infusion. NS 1000 ml bolus, levophed drip prn. Transfer to stepdown units.  Additional critical care 38 minutes.

## 2016-04-17 NOTE — ED Notes (Signed)
Checked pts blood sugar. Cbg 28. Dr. Bridgett Larsson notified, orders obtained.

## 2016-04-17 NOTE — Progress Notes (Signed)
Pt transferred to ICU d/t unstable BG and hypotension unresponsive to tmt.   Husband notified that pt was transported to ICU Room 2.

## 2016-04-17 NOTE — H&P (Signed)
Received pt from ED to room 201.  Pt appeared diaphoretic and lethargic.  Based on report from ED RN of the pt hypoglycemics episodes, the BG was checked and found to be 37.  Hypoglycemic protocol iniated.  A full amp of D50 administered to the patient.    BG rechecked and found to be 134.  Will continue to monitor and notify MD of any changes.

## 2016-04-17 NOTE — Progress Notes (Signed)
Pt transferred from floor today emergently with a cgb of 12, momentarily unresponsive with drool coming from mouth. Pt immediately turned on side and placed on seizure precautions. D50 given upon arrival. D10 running at 186m/hr. Blood sugars have been very labile requiring D50 pushes with 15 minute rechecks. Pt alert and oriented with no c/o pain. Speech is slightly slurred. SB on cardiac monitor. Lung sounds clear/diminished to auscultation. RR even an unlabored. O2 sats 100% on 2LNC.

## 2016-04-17 NOTE — ED Notes (Signed)
Code Sepsis called to [facility] at [phone]

## 2016-04-17 NOTE — ED Provider Notes (Signed)
Time Seen: Approximately 0830  I have reviewed the triage notes  Chief Complaint: No chief complaint on file.   History of Present Illness: Barbara [name] is a 75 y.o. female *who presents via EMS with low blood sugar. They found the patient with a blood sugar of 40 and she had some mental improvement with a IM glucagon and some oral glucose prior to arrival. The patient has history of numerous chronic medical problems and with her recent history of sepsis with urinary tract infection. She has history of anemia and chronic renal disease. 2 diabetes. Patient states that she didn't eat this morning though she is normally awake and alert and oriented to location name and the year. Patient does not know the month or the day. Awaiting family to arrive for further interviewing. Patient does have a right-sided port which appears to be accessible and is currently receiving chemotherapy for bladder cancer.   Past Medical History:  Diagnosis Date  . Anemia associated with chronic renal failure   . Arthritis   . CKD (chronic kidney disease)   . COPD (chronic obstructive pulmonary disease) ([place])   . Diabetes mellitus without complication ([place])   . Hypertension   . Sleep apnea    uses cpap  . Urinary obstruction 01/2016    Patient Active Problem List   Diagnosis Date Noted  . Palliative care by specialist   . DNR (do not resuscitate) discussion   . C. difficile diarrhea 03/11/2016  . Anemia 03/11/2016  . Hypotension 03/11/2016  . Bladder cancer ([place]) 03/11/2016  . Acidosis 03/11/2016  . Failure to thrive (child) 03/11/2016  . Weakness generalized 03/11/2016  . ARF (acute renal failure) ([place]) 03/04/2016  . Cancer of trigone of urinary bladder ([place]) 03/02/2016  . Absolute anemia 02/04/2016  . Airway hyperreactivity 02/04/2016  . Celiac disease 02/04/2016  . Gastric catarrh 02/04/2016  . Acid reflux 02/04/2016  . Combined fat and carbohydrate induced hyperlipemia 02/04/2016  . C.  difficile colitis 01/22/2016  . Urinary obstruction 01/21/2016  . COPD (chronic obstructive pulmonary disease) ([place]) 01/21/2016  . Controlled type 2 diabetes mellitus with stage 4 chronic kidney disease, with long-term current use of insulin ([place]) 01/21/2016  . Essential hypertension 01/21/2016  . Acute renal failure superimposed on stage 4 chronic kidney disease ([place]) 01/20/2016  . Anemia in chronic kidney disease 01/20/2016  . Sepsis ([place]) 12/18/2015  . UTI (lower urinary tract infection) 12/18/2015  . Arthritis of knee, degenerative 05/10/2015  . Knee strain 03/26/2015  . Other intervertebral disc displacement, lumbar region 03/04/2015  . Degenerative arthritis of lumbar spine 03/04/2015  . Injury of tendon of upper extremity 11/12/2014  . Atherosclerosis of abdominal aorta ([place]) 11/02/2014  . Chronic kidney disease, stage IV (severe) ([place]) 11/02/2014  . Obstructive apnea 11/02/2014  . Complete rotator cuff rupture of left shoulder 10/05/2014  . Arthritis of shoulder region, degenerative 10/05/2014  . CAD in native artery 12/08/2013  . Benign essential HTN 12/08/2013  . Type 2 diabetes mellitus ([place]) 12/08/2013    Past Surgical History:  Procedure Laterality Date  . ABDOMINAL HYSTERECTOMY    . CYSTOSCOPY W/ RETROGRADES Bilateral 02/17/2016   Procedure: CYSTOSCOPY WITH RETROGRADE PYELOGRAM;  Surgeon: Hollice Espy, MD;  Location: [facility];  Service: Urology;  Laterality: Bilateral;  . CYSTOSCOPY WITH STENT PLACEMENT Left 01/21/2016   Procedure: CYSTOSCOPY WITH double J STENT PLACEMENT;  Surgeon: Franchot Gallo, MD;  Location: [facility];  Service: Urology;  Laterality: Left;  . KIDNEY SURGERY  01/21/2016   IR NEPHROSTOMY PLACEMENT LEFT   . PERIPHERAL VASCULAR CATHETERIZATION N/A 04/07/2016   Procedure: Glori Luis Cath Insertion;  Surgeon: Katha Cabal, MD;  Location: [place] [facility];  Service: Cardiovascular;  Laterality: N/A;  . ROTATOR CUFF REPAIR     left  . TRANSURETHRAL  RESECTION OF BLADDER TUMOR N/A 02/17/2016   Procedure: TRANSURETHRAL RESECTION OF BLADDER TUMOR (TURBT)-LARGE;  Surgeon: Hollice Espy, MD;  Location: [facility];  Service: Urology;  Laterality: N/A;  . URETEROSCOPY Left 02/17/2016   Procedure: URETEROSCOPY;  Surgeon: Hollice Espy, MD;  Location: [facility];  Service: Urology;  Laterality: Left;    Past Surgical History:  Procedure Laterality Date  . ABDOMINAL HYSTERECTOMY    . CYSTOSCOPY W/ RETROGRADES Bilateral 02/17/2016   Procedure: CYSTOSCOPY WITH RETROGRADE PYELOGRAM;  Surgeon: Hollice Espy, MD;  Location: [facility];  Service: Urology;  Laterality: Bilateral;  . CYSTOSCOPY WITH STENT PLACEMENT Left 01/21/2016   Procedure: CYSTOSCOPY WITH double J STENT PLACEMENT;  Surgeon: Franchot Gallo, MD;  Location: [facility];  Service: Urology;  Laterality: Left;  . KIDNEY SURGERY  01/21/2016   IR NEPHROSTOMY PLACEMENT LEFT   . PERIPHERAL VASCULAR CATHETERIZATION N/A 04/07/2016   Procedure: Glori Luis Cath Insertion;  Surgeon: Katha Cabal, MD;  Location: [place] [facility];  Service: Cardiovascular;  Laterality: N/A;  . ROTATOR CUFF REPAIR     left  . TRANSURETHRAL RESECTION OF BLADDER TUMOR N/A 02/17/2016   Procedure: TRANSURETHRAL RESECTION OF BLADDER TUMOR (TURBT)-LARGE;  Surgeon: Hollice Espy, MD;  Location: [facility];  Service: Urology;  Laterality: N/A;  . URETEROSCOPY Left 02/17/2016   Procedure: URETEROSCOPY;  Surgeon: Hollice Espy, MD;  Location: [facility];  Service: Urology;  Laterality: Left;    Current Outpatient Rx  . Order #: [id number] Class: Historical Med  . Order #: [id number] Class: Historical Med  . Order #: [id number] Class: Normal  . Order #: [id number] Class: Historical Med  . Order #: [id number] Class: Normal  . Order #: [id number] Class: Print  . Order #: [id number] Class: Normal  . Order #: [id number] Class: Normal  . Order #: [id number] Class: Normal  . Order #: [id number] Class: Historical Med  . Order #: [id number] Class: Print   . Order #: [id number] Class: Normal  . Order #: [id number] Class: Normal  . Order #: [id number] Class: Normal  . Order #: [id number] Class: Historical Med  . Order #: [id number] Class: Historical Med    Allergies:  Percocet [oxycodone-acetaminophen]  Family History: Family History  Problem Relation Age of Onset  . Diabetes Mother   . Cancer Father   . Breast cancer Neg Hx   . Kidney disease Neg Hx     Social History: Social History  Substance Use Topics  . Smoking status: Former Smoker    Quit date: 07/20/1993  . Smokeless tobacco: Never Used     Comment: 01/22/2016   "  quit smoking many years ago "  . Alcohol use No     Review of Systems:   10 point review of systems was performed and was otherwise negative: Reveals systems is somewhat limited to the medical record. Patient denies any current pain. Constitutional: No fever Eyes: No visual disturbances ENT: No sore throat, ear pain Cardiac: No chest pain Respiratory: No shortness of breath, wheezing, or stridor Abdomen: No abdominal pain, no vomiting, No diarrhea Endocrine: No weight loss, No night sweats Extremities: No peripheral edema, cyanosis Skin: No rashes, easy bruising Neurologic: No focal weakness, trouble with speech or swollowing Urologic: No dysuria, Hematuria, or urinary frequency   Physical Exam:  ED Triage Vitals  Enc Vitals Group     BP      Pulse      Resp      Temp      Temp src      SpO2      Weight      Height      Head Circumference      Peak Flow      Pain Score      Pain Loc      Pain Edu?      Excl. in [place]?     General: Awake , Alert , and Oriented times 2, cooperative Head: Normal cephalic , atraumatic Eyes: Pupils equal , round, reactive to light eso tropia Nose/Throat: No nasal drainage, patent upper airway without erythema or exudate.  Neck: Supple, Full range of motion, No anterior adenopathy or palpable thyroid masses Lungs: Clear to ascultation without wheezes , rhonchi,  or rales Heart: Regular rate, regular rhythm without murmurs , gallops , or rubs Abdomen: Soft, non tender without rebound, guarding , or rigidity; bowel sounds positive and symmetric in all 4 quadrants. No organomegaly .        Extremities: 2 plus symmetric pulses. No edema, clubbing or cyanosis Neurologic: Generalized weakness with limited cooperation for neurologic exam Skin: warm, dry, no rashes   Labs:   All laboratory work was reviewed including any pertinent negatives or positives listed below:  Labs Reviewed  GLUCOSE, CAPILLARY - Abnormal; Notable for the following:       Result Value   Glucose-Capillary 49 (*)    All other components within normal limits  Laboratory work reviewed shows increasing renal insufficiency/renal failure. Hemoglobins consistently low compared to previous levels. Patient's lactic acid was negative. Blood cultures and urine culture pending with positive urinalysis.  EKG:  ED ECG REPORT I, Daymon Larsen, the attending physician, personally viewed and interpreted this ECG.  Date: 04/17/2016 EKG Time: 0845 Rate: 74Rhythm: normal sinus rhythm with occasional PVC QRS Axis: normal Intervals: normal ST/T Wave abnormalities: Nonspecific ST wave findings Conduction Disturbances: none Narrative Interpretation: unremarkable No acute ischemic changes   Radiology: * "Ct Head Wo Contrast  Result Date: 04/17/2016 CLINICAL DATA:  Seizure.  Hypoglycemia EXAM: CT HEAD WITHOUT CONTRAST TECHNIQUE: Contiguous axial images were obtained from the base of the skull through the vertex without intravenous contrast. COMPARISON:  Dec 17, 2015 FINDINGS: Brain: There is mild diffuse atrophy. There is no intracranial mass, hemorrhage, extra-axial fluid collection, or midline shift. There is mild patchy small vessel disease in the centra semiovale bilaterally. There is evidence of a prior small lacunar infarct posterior limb of the right internal capsule. No acute infarct is  evident. Vascular: No hyperdense vessels. There is calcification in each carotid siphon region. Skull: The bony calvarium appears intact. Sinuses/Orbits: Visualized paranasal sinuses are clear. Visualized orbits appear symmetric bilaterally. Other: Visualized mastoid air cells are clear. IMPRESSION: Mild atrophy with patchy periventricular small vessel disease. 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 No acute infarct evident. No hemorrhage, mass, or extra-axial fluid collection. There is calcification in each carotid siphon. Electronically Signed   By: Lowella Grip III M.D.   On: 04/17/2016 10:00   Dg Chest Port 1 View  Result Date: 04/17/2016 CLINICAL DATA:  Altered mental status EXAM: PORTABLE CHEST 1 VIEW COMPARISON:  03/04/2016 chest radiograph. FINDINGS: Right internal jugular MediPort terminates in the middle third of the superior vena cava. Stable cardiomediastinal silhouette  with mild cardiomegaly and aortic atherosclerosis. No pneumothorax. No pleural effusion. No overt pulmonary edema. No acute consolidative airspace disease. IMPRESSION: 1. Stable mild cardiomegaly without overt pulmonary edema. 2. No active pulmonary disease. 3. Aortic atherosclerosis. Electronically Signed   By: Ilona Sorrel M.D.   On: 04/17/2016 10:31  "   I personally reviewed the radiologic studies   Critical Care:  CRITICAL CARE Performed by: Daymon Larsen   Total critical care time: 47 minutes  Critical care time was exclusive of separately billable procedures and treating other patients.  Critical care was necessary to treat or prevent imminent or life-threatening deterioration.  Critical care was time spent personally by me on the following activities: development of treatment plan with patient and/or surrogate as well as nursing, discussions with consultants, evaluation of patient's response to treatment, examination of patient, obtaining history from patient  or surrogate, ordering and performing treatments and interventions, ordering and review of laboratory studies, ordering and review of radiographic studies, pulse oximetry and re-evaluation of patient's condition. Initial evaluation treatment for code sepsis with new onset seizures    ED Course:  Patient had what appears to be a generalized seizure shortly after arrival. She had some occasional jerking while here in emergency department initially on evaluation with spasms of the upper and lower extremities however she was still able to converse and I was difficult to call the seizures. The patient then had a generalized seizure she was given 1 mg of IV Ativan. She did not become hypoxic and her airway was suctioned with previous back valve masking and seemed to be protecting her airway after short observation period. The patient was given IV D50 at the same time as she was having a seizure with recheck of her blood sugar at 171. Head CT and laboratory work are pending at this time if the patient has a repeat seizure most likely start [place].  The patient had no further seizure activity after 1 mg of Ativan and seems to be more in a postictal state. Her head CT did not show any focal findings. She has a history of multiple medical problems including hypoglycemia, now sepsis which is likely urosepsis and also bladder cancer and is currently receiving chemotherapy. The patient was found to be hypothermic and was started on a bear hugger and again was initiated on sepsis protocol. She has had periodic drops in her blood sugar. Patient was responded well to D50 bolus. Patient's also been occasionally hypotensive and was started On full sepsis fluid resuscitation especially after return of a creatinine of 6.25. Patient is no findings of pulmonary edema and I felt we could aggressively fluid resuscitate. She was started on IV antibiotics here in emergency department with Zosyn and vancomycin while results were  pending on the urinalysis.  Clinical Course     Assessment:  New-onset seizure Hypoglycemia Urosepsis      Plan: * Inpatient management            Daymon Larsen, MD 04/17/16 1233

## 2016-04-17 NOTE — ED Notes (Signed)
Pt able to answer some questions when stimulated.

## 2016-04-17 NOTE — Progress Notes (Signed)
[facility] NOTE  Patient Care Team: Madelyn Brunner, MD as PCP - General (Internal Medicine)  CHIEF COMPLAINTS/PURPOSE OF CONSULTATION:  Bladder cancer/anemia  HISTORY OF PRESENTING ILLNESS: Patient ICU/poor historian/no family Barbara [name] 75 y.o.  female a history of muscle invasive bladder cancer-currently undergoing palliative concurrent  ; and with multiple medical problems including chronic kidney disease/debility/ diabetes/COPD/recent admission for C. Difficile- is currently admitted to hospital for mental status changes.  Patient was found to have weakness/multiple status changes- noted to have hypoglycemia ; and a possible seizure. Patient is currently in ICU. Patient has been getting frequent D5 for her recurrent hypoglycemia. Patient's kidney function is also worsened; followed by nephrology. Patient's creatinine is up 6 at this time; hemoglobin 7.2.   ROS: Difficult to assess given the mental status changes  MEDICAL HISTORY:  Past Medical History:  Diagnosis Date  . Anemia associated with chronic renal failure   . Arthritis   . Chronic kidney disease   . CKD (chronic kidney disease)   . COPD (chronic obstructive pulmonary disease) ([place])   . Diabetes mellitus without complication ([place])   . Hypertension   . Sleep apnea    uses cpap  . Urinary obstruction 01/2016    SURGICAL HISTORY: Past Surgical History:  Procedure Laterality Date  . ABDOMINAL HYSTERECTOMY    . CYSTOSCOPY W/ RETROGRADES Bilateral 02/17/2016   Procedure: CYSTOSCOPY WITH RETROGRADE PYELOGRAM;  Surgeon: Hollice Espy, MD;  Location: [facility];  Service: Urology;  Laterality: Bilateral;  . CYSTOSCOPY WITH STENT PLACEMENT Left 01/21/2016   Procedure: CYSTOSCOPY WITH double J STENT PLACEMENT;  Surgeon: Franchot Gallo, MD;  Location: [facility];  Service: Urology;  Laterality: Left;  . KIDNEY SURGERY  01/21/2016   IR NEPHROSTOMY PLACEMENT LEFT   . PERIPHERAL VASCULAR CATHETERIZATION  N/A 04/07/2016   Procedure: Glori Luis Cath Insertion;  Surgeon: Katha Cabal, MD;  Location: [place] [facility];  Service: Cardiovascular;  Laterality: N/A;  . ROTATOR CUFF REPAIR     left  . TRANSURETHRAL RESECTION OF BLADDER TUMOR N/A 02/17/2016   Procedure: TRANSURETHRAL RESECTION OF BLADDER TUMOR (TURBT)-LARGE;  Surgeon: Hollice Espy, MD;  Location: [facility];  Service: Urology;  Laterality: N/A;  . URETEROSCOPY Left 02/17/2016   Procedure: URETEROSCOPY;  Surgeon: Hollice Espy, MD;  Location: [facility];  Service: Urology;  Laterality: Left;    SOCIAL HISTORY: Social History   Social History  . Marital status: Married    Spouse name: N/A  . Number of children: N/A  . Years of education: N/A   Occupational History  . retired    Social History Main Topics  . Smoking status: Former Smoker    Quit date: 07/20/1993  . Smokeless tobacco: Never Used     Comment: 01/22/2016   "  quit smoking many years ago "  . Alcohol use No  . Drug use: No  . Sexual activity: Not on file   Other Topics Concern  . Not on file   Social History Narrative  . No narrative on file    FAMILY HISTORY: Family History  Problem Relation Age of Onset  . Diabetes Mother   . Cancer Father   . Breast cancer Neg Hx   . Kidney disease Neg Hx     ALLERGIES:  is allergic to percocet [oxycodone-acetaminophen].  MEDICATIONS:  Current Facility-Administered Medications  Medication Dose Route Frequency Provider Last Rate Last Dose  . albuterol (PROVENTIL) (2.5 MG/3ML) 0.083% nebulizer solution 2.5 mg  2.5 mg Nebulization Q2H PRN Demetrios Loll, MD      . atorvastatin (LIPITOR) tablet 40 mg  40 mg Oral q1800 Demetrios Loll, MD      . bisacodyl (DULCOLAX) suppository 10 mg  10 mg Rectal Daily PRN Demetrios Loll, MD      . cefTRIAXone (ROCEPHIN) 1 g in dextrose 5 % 50 mL IVPB  1 g Intravenous Q24H Demetrios Loll, MD   1 g at 04/17/16 1850  . dextrose 10 % infusion   Intravenous Continuous Demetrios Loll, MD 125 mL/hr at 04/17/16  1709    . dronabinol (MARINOL) capsule 2.5 mg  2.5 mg Oral BID AC Demetrios Loll, MD   Stopped at 04/17/16 1616  . famotidine (PEPCID) tablet 20 mg  20 mg Oral Daily Demetrios Loll, MD   Stopped at 04/17/16 1416  . ferrous sulfate tablet 325 mg  325 mg Oral BID WC Demetrios Loll, MD   Stopped at 04/17/16 1617  . folic acid (FOLVITE) tablet 1 mg  1 mg Oral Daily Demetrios Loll, MD   Stopped at 04/17/16 1417  . gabapentin (NEURONTIN) capsule 100 mg  100 mg Oral BID Demetrios Loll, MD   Stopped at 04/17/16 1417  . heparin injection 5,000 Units  5,000 Units Subcutaneous Q8H Demetrios Loll, MD   5,000 Units at 04/17/16 1502  . HYDROcodone-acetaminophen (NORCO/VICODIN) 5-325 MG per tablet 1-2 tablet  1-2 tablet Oral Q6H PRN Demetrios Loll, MD      . insulin aspart (novoLOG) injection 0-5 Units  0-5 Units Subcutaneous QHS Demetrios Loll, MD      . insulin aspart (novoLOG) injection 0-9 Units  0-9 Units Subcutaneous TID WC Demetrios Loll, MD      . LORazepam (ATIVAN) 2 MG/ML injection           . LORazepam (ATIVAN) injection 1 mg  1 mg Intravenous Q2H PRN Demetrios Loll, MD      . mirabegron [facility]) tablet 25 mg  25 mg Oral Daily Demetrios Loll, MD      . ondansetron [facility]) tablet 4 mg  4 mg Oral Q6H PRN Demetrios Loll, MD       Or  . ondansetron [facility]) injection 4 mg  4 mg Intravenous Q6H PRN Demetrios Loll, MD      . sodium chloride 0.9 % bolus 1,000 mL  1,000 mL Intravenous Once Demetrios Loll, MD          .  PHYSICAL EXAMINATION:  Vitals:   04/17/16 1700 04/17/16 1800  BP: (!) 105/50 (!) 111/51  Pulse: 64 66  Resp: (!) 22 19  Temp:     Filed Weights   04/17/16 0929 04/17/16 1645  Weight: 169 lb (76.7 kg) 177 lb 11.1 oz (80.6 kg)    GENERAL: Well-nourished well-developed; Alert, no distress and comfortable. Alone. EYES: no pallor or icterus OROPHARYNX: no thrush or ulceration. NECK: supple, no masses felt LYMPH:  no palpable lymphadenopathy in the cervical, axillary or inguinal regions LUNGS: decreased breath sounds to auscultation at  bases and  No wheeze or crackles HEART/CVS: regular rate & rhythm and no murmurs; No lower extremity edema ABDOMEN: abdomen soft, non-tender and normal bowel sounds Musculoskeletal:no cyanosis of digits and no clubbing  PSYCH: alert & oriented x 1.  NEURO: no focal motor/sensory deficits SKIN:  no rashes or significant lesions  LABORATORY DATA:  I have reviewed the data as listed Lab Results  Component Value Date   WBC 7.3 04/17/2016   HGB 7.2 (L) 04/17/2016  HCT 22.3 (L) 04/17/2016   MCV 83.0 04/17/2016   PLT 137 (L) 04/17/2016    Recent Labs  03/04/16 1046  03/11/16 0458 04/02/16 0905 04/09/16 1021 04/17/16 0841  NA 132*  < > 142 131* 135 130*  K 4.4  < > 3.2* 3.2* 3.5 4.2  CL 106  < > 103 102 109 104  CO2 10*  < > 31 21* 20* 14*  GLUCOSE 120*  < > 114* 127* 114* 89  BUN 89*  < > 38* 36* 41* 46*  CREATININE 9.33*  < > 1.83* 3.43* 2.30* 6.42*  CALCIUM 8.3*  < > 8.4* 8.6* 8.6* 8.4*  GFRNONAA 4*  < > 26* 12* 19* 6*  GFRAA 4*  < > 30* 14* 23* 7*  PROT 7.6  --   --  8.7*  --  7.9  ALBUMIN 2.6*  --  2.4* 3.0*  --  2.5*  AST 42*  --   --  16  --  19  ALT 38  --   --  10*  --  9*  ALKPHOS 88  --   --  73  --  60  BILITOT 0.8  --   --  0.5  --  0.5  < > = values in this interval not displayed.  RADIOGRAPHIC STUDIES: I have personally reviewed the radiological images as listed and agreed with the findings in the report. Ct Head Wo Contrast  Result Date: 04/17/2016 CLINICAL DATA:  Seizure.  Hypoglycemia EXAM: CT HEAD WITHOUT CONTRAST TECHNIQUE: Contiguous axial images were obtained from the base of the skull through the vertex without intravenous contrast. COMPARISON:  Dec 17, 2015 FINDINGS: Brain: There is mild diffuse atrophy. There is no intracranial mass, hemorrhage, extra-axial fluid collection, or midline shift. There is mild patchy small vessel disease in the centra semiovale bilaterally. There is evidence of a prior small lacunar infarct posterior limb of the right  internal capsule. No acute infarct is evident. Vascular: No hyperdense vessels. There is calcification in each carotid siphon region. Skull: The bony calvarium appears intact. Sinuses/Orbits: Visualized paranasal sinuses are clear. Visualized orbits appear symmetric bilaterally. Other: Visualized mastoid air cells are clear. IMPRESSION: Mild atrophy with patchy periventricular small vessel disease. 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 No acute infarct evident. No hemorrhage, mass, or extra-axial fluid collection. There is calcification in each carotid siphon. Electronically Signed   By: Lowella Grip III M.D.   On: 04/17/2016 10:00   Dg Chest Port 1 View  Result Date: 04/17/2016 CLINICAL DATA:  Altered mental status EXAM: PORTABLE CHEST 1 VIEW COMPARISON:  03/04/2016 chest radiograph. FINDINGS: Right internal jugular MediPort terminates in the middle third of the superior vena cava. Stable cardiomediastinal silhouette with mild cardiomegaly and aortic atherosclerosis. No pneumothorax. No pleural effusion. No overt pulmonary edema. No acute consolidative airspace disease. IMPRESSION: 1. Stable mild cardiomegaly without overt pulmonary edema. 2. No active pulmonary disease. 3. Aortic atherosclerosis. Electronically Signed   By: Ilona Sorrel M.D.   On: 04/17/2016 10:31    ASSESSMENT & PLAN:   # 75 year old female patient with history of muscle invasive bladder cancer currently with the hospital for generalized weakness mentioned status changes.  # Muscle invasive bladder cancer- patient currently undergoing concurrent chemoradiation [gemcitabine weekly]- palliative/local control. Patient not a candidate for cystectomy.   # CKD/worsening renal failure- followed by nephrology  # Recurrent hypoglycemia/ needing frequent dextrose injections.   # Anemia hemoglobin 7.2/ transfuse  if hemoglobin drops to less than 7.   # UTI- 1 antibiotics.  Thank you Dr.  Bridgett Larsson for allowing me to participate in the care of your pleasant patient.     Cammie Sickle, MD 04/17/2016 7:38 PM

## 2016-04-17 NOTE — ED Notes (Signed)
Pt incontinent of stool. Cleaned and changed.

## 2016-04-17 NOTE — Progress Notes (Signed)
[facility]  ROUNDING NOTE   Subjective:  Patient well known to Barbara [name].  She has underlying CKD stage III. Presents now with hypoglyecemia, poor po intake and ARF likely after starting chemo  Recently.  BUN up to 46, Cr 6.42.     Objective:  Vital signs in last 24 hours:  Temp:  [94.9 F (34.9 C)-97.9 F (36.6 C)] 97.9 F (36.6 C) (09/29 1335) Pulse Rate:  [60-69] 60 (09/29 1603) Resp:  [13-19] 16 (09/29 1130) BP: (83-133)/(46-98) 89/47 (09/29 1603) SpO2:  [98 %-100 %] 98 % (09/29 1335) Weight:  [76.7 kg (169 lb)] 76.7 kg (169 lb) (09/29 0929)  Weight change:  Filed Weights   04/17/16 0929  Weight: 76.7 kg (169 lb)    Intake/Output: No intake/output data recorded.   Intake/Output this shift:  No intake/output data recorded.  Physical Exam: General: No acute distress  Head: Normocephalic, atraumatic. Moist oral mucosal membranes  Eyes: Anicteric  Neck: Supple, trachea midline  Lungs:  Clear to auscultation, normal effort  Heart: S1S2 no rubs  Abdomen:  Soft, nontender, BS present  Extremities: no peripheral edema.  Neurologic: Nonfocal, moving all four extremities  Skin: No lesions       Basic Metabolic Panel:  Recent Labs Lab 04/17/16 0841  NA 130*  K 4.2  CL 104  CO2 14*  GLUCOSE 89  BUN 46*  CREATININE 6.42*  CALCIUM 8.4*  MG 1.5*    Liver Function Tests:  Recent Labs Lab 04/17/16 0841  AST 19  ALT 9*  ALKPHOS 60  BILITOT 0.5  PROT 7.9  ALBUMIN 2.5*   No results for input(s): LIPASE, AMYLASE in the last 168 hours. No results for input(s): AMMONIA in the last 168 hours.  CBC:  Recent Labs Lab 04/17/16 0841  WBC 7.3  NEUTROABS 6.8*  HGB 7.2*  HCT 22.3*  MCV 83.0  PLT 137*    Cardiac Enzymes:  Recent Labs Lab 04/17/16 0841  TROPONINI 0.03*    BNP: Invalid input(s): POCBNP  CBG:  Recent Labs Lab 04/17/16 1535 04/17/16 1635 04/17/16 1647 04/17/16 1708 04/17/16 1731  GLUCAP 105* 12* 123* 9 123*     Microbiology: Results for orders placed or performed during the hospital encounter of 03/04/16  Urine culture     Status: Abnormal   Collection Time: 03/04/16 10:50 AM  Result Value Ref Range Status   Specimen Description URINE, RANDOM  Final   Special Requests Normal  Final   Culture MULTIPLE SPECIES PRESENT, SUGGEST RECOLLECTION (A)  Final   Report Status 03/06/2016 FINAL  Final  CULTURE, BLOOD (ROUTINE X 2) w Reflex to ID Panel     Status: None   Collection Time: 03/04/16  1:57 PM  Result Value Ref Range Status   Specimen Description BLOOD REJ  Final   Special Requests   Final    BOTTLES DRAWN AEROBIC AND ANAEROBIC AER 4ML ANA 3ML   Culture NO GROWTH 5 DAYS  Final   Report Status 03/09/2016 FINAL  Final  Gastrointestinal Panel by PCR , Stool     Status: None   Collection Time: 03/05/16  1:15 AM  Result Value Ref Range Status   Campylobacter species NOT DETECTED NOT DETECTED Final   Plesimonas shigelloides NOT DETECTED NOT DETECTED Final   Salmonella species NOT DETECTED NOT DETECTED Final   Yersinia enterocolitica NOT DETECTED NOT DETECTED Final   Vibrio species NOT DETECTED NOT DETECTED Final   Vibrio cholerae NOT DETECTED NOT DETECTED Final  Enteroaggregative E coli (EAEC) NOT DETECTED NOT DETECTED Final   Enteropathogenic E coli (EPEC) NOT DETECTED NOT DETECTED Final   Enterotoxigenic E coli (ETEC) NOT DETECTED NOT DETECTED Final   Shiga like toxin producing E coli (STEC) NOT DETECTED NOT DETECTED Final   E. coli O157 NOT DETECTED NOT DETECTED Final   Shigella/Enteroinvasive E coli (EIEC) NOT DETECTED NOT DETECTED Final   Cryptosporidium NOT DETECTED NOT DETECTED Final   Cyclospora cayetanensis NOT DETECTED NOT DETECTED Final   Entamoeba histolytica NOT DETECTED NOT DETECTED Final   Giardia lamblia NOT DETECTED NOT DETECTED Final   Adenovirus F40/41 NOT DETECTED NOT DETECTED Final   Astrovirus NOT DETECTED NOT DETECTED Final   Norovirus GI/GII NOT DETECTED NOT  DETECTED Final   Rotavirus A NOT DETECTED NOT DETECTED Final   Sapovirus (I, II, IV, and V) NOT DETECTED NOT DETECTED Final  C difficile quick scan w PCR reflex     Status: Abnormal   Collection Time: 03/05/16 11:20 AM  Result Value Ref Range Status   C Diff antigen POSITIVE (A) NEGATIVE Final   C Diff toxin POSITIVE (A) NEGATIVE Final   C Diff interpretation Toxin producing C. difficile detected.  Final    Comment: VALID CRITICAL RESULT CALLED TO, READ BACK BY AND VERIFIED WITH: ISABELLA ROBINSON 03/05/16 1439 KLW   Urine culture     Status: Abnormal   Collection Time: 03/08/16  8:41 AM  Result Value Ref Range Status   Specimen Description URINE, RANDOM  Final   Special Requests NONE  Final   Culture 20,000 COLONIES/mL YEAST (A)  Final   Report Status 03/09/2016 FINAL  Final    Coagulation Studies: No results for input(s): LABPROT, INR in the last 72 hours.  Urinalysis:  Recent Labs  04/17/16 0835  COLORURINE YELLOW*  LABSPEC 1.008  PHURINE 7.0  GLUCOSEU 150*  HGBUR 3+*  BILIRUBINUR NEGATIVE  KETONESUR NEGATIVE  PROTEINUR 100*  NITRITE NEGATIVE  LEUKOCYTESUR 3+*      Imaging: Ct Head Wo Contrast  Result Date: 04/17/2016 CLINICAL DATA:  Seizure.  Hypoglycemia EXAM: CT HEAD WITHOUT CONTRAST TECHNIQUE: Contiguous axial images were obtained from the base of the skull through the vertex without intravenous contrast. COMPARISON:  Dec 17, 2015 FINDINGS: Brain: There is mild diffuse atrophy. There is no intracranial mass, hemorrhage, extra-axial fluid collection, or midline shift. There is mild patchy small vessel disease in the centra semiovale bilaterally. There is evidence of a prior small lacunar infarct posterior limb of the right internal capsule. No acute infarct is evident. Vascular: No hyperdense vessels. There is calcification in each carotid siphon region. Skull: The bony calvarium appears intact. Sinuses/Orbits: Visualized paranasal sinuses are clear. Visualized  orbits appear symmetric bilaterally. Other: Visualized mastoid air cells are clear. IMPRESSION: Mild atrophy with patchy periventricular small vessel disease. 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 No acute infarct evident. No hemorrhage, mass, or extra-axial fluid collection. There is calcification in each carotid siphon. Electronically Signed   By: Lowella Grip III M.D.   On: 04/17/2016 10:00   Dg Chest Port 1 View  Result Date: 04/17/2016 CLINICAL DATA:  Altered mental status EXAM: PORTABLE CHEST 1 VIEW COMPARISON:  03/04/2016 chest radiograph. FINDINGS: Right internal jugular MediPort terminates in the middle third of the superior vena cava. Stable cardiomediastinal silhouette with mild cardiomegaly and aortic atherosclerosis. No pneumothorax. No pleural effusion. No overt pulmonary edema. No acute consolidative airspace disease. IMPRESSION: 1. Stable mild cardiomegaly without  overt pulmonary edema. 2. No active pulmonary disease. 3. Aortic atherosclerosis. Electronically Signed   By: Ilona Sorrel M.D.   On: 04/17/2016 10:31     Medications:   . dextrose 125 mL/hr at 04/17/16 1709   . atorvastatin  40 mg Oral q1800  . cefTRIAXone (ROCEPHIN)  IV  1 g Intravenous Q24H  . dronabinol  2.5 mg Oral BID AC  . famotidine  20 mg Oral Daily  . ferrous sulfate  325 mg Oral BID WC  . folic acid  1 mg Oral Daily  . gabapentin  100 mg Oral BID  . heparin  5,000 Units Subcutaneous Q8H  . insulin aspart  0-5 Units Subcutaneous QHS  . insulin aspart  0-9 Units Subcutaneous TID WC  . LORazepam      . mirabegron ER  25 mg Oral Daily  . sodium chloride  1,000 mL Intravenous Once   albuterol, bisacodyl, HYDROcodone-acetaminophen, LORazepam, ondansetron **OR** ondansetron (ZOFRAN) IV  Assessment/ Plan:  75 y.o. female with long-standing hypertension, diabetes type 2, hyperlipidemia, osteoarthritis, obstructive sleep apnea, bladder cancer 8/17 s/p TURBT and  radiation therapy/chemo with gemcitabine.  1. Acute renal failure/chronic kidney disease stage IV.  Baseline creatinine 2.3 EGFR 23. Acute renal failure now likely related to poor by mouth intake status post chemotherapy treatment with gemcitabine. No urgent indication for dialysis at the moment. Continue IV fluid hydration. Check renal ultrasound to make sure there is no worsening obstruction. Previously the patient had left-sided hydronephrosis.  2. Anemia of chronic kidney disease. Hemoglobin currently down to 7.2. Consider blood transfusion for hemoglobin of 7 or less.  3. urinary tract infection. Too numerous to count RBCs and WBCs noted in the urine. She has been started on ceftriaxone.  4. Thanks for consultation.   LOS: 0 ,  9/29/20175:46 PM

## 2016-04-17 NOTE — ED Notes (Signed)
Pt alert and oriented to self and time. Pt blood sugar up to 161.

## 2016-04-17 NOTE — ED Notes (Signed)
Informed RN bed ready

## 2016-04-17 NOTE — ED Notes (Signed)
Pt alert on arrival. [place] accessed. Pt has seizure. MD notified. Respiratory called. Orders obtained.

## 2016-04-17 NOTE — ED Triage Notes (Signed)
Pt came to ED via EMS from home. Pts husband reports pt was having seizure like activity this morning. Per EMS, blood sugar 30. Given glucagon IM w/ EMS. Blood sugar 49 on arrival. Pt diaphoretic upon arrival, alert to self.

## 2016-04-17 NOTE — H&P (Signed)
[facility] [place] NAME: Barbara [name]    MR#:  [id number]  DATE OF BIRTH:  01-02-1941  DATE OF ADMISSION:  04/17/2016  PRIMARY CARE PHYSICIAN: Madelyn Brunner, MD   REQUESTING/REFERRING PHYSICIAN: Daymon Larsen, MD  CHIEF COMPLAINT:   Chief Complaint  Patient presents with  . Seizures  . Hypoglycemia   Seizure and hypoglycemia today. HISTORY OF PRESENT ILLNESS:  Barbara [name]  is a 75 y.o. female with a known history of Hypertension, diabetes, COPD, CKD and bladder cancer with left hydronephrosis. The patient was found confused and low sugar 40 this morning. He was treated with IM glucagon an oral glucose by EMS. According to her husband, she did not eat this morning and did not receive any insulin. She recently got chemotherapy and radiation therapy due to bladder cancer. Patient had what appears to be a generalized seizure shortly after arrival. She had some occasional jerking while here in emergency department. Then she had a generalized seizure she was given 1 mg of IV Ativan. The patient was given IV D50 at the same time as she was having a seizure with recheck of her blood sugar at 171. Head CT did show any acute abnormality. But her creatinine increased from 2.3 to 6.42. Also she has hypothermia (95.6) and hypotension 86/42 just now.  PAST MEDICAL HISTORY:   Past Medical History:  Diagnosis Date  . Anemia associated with chronic renal failure   . Arthritis   . Chronic kidney disease   . CKD (chronic kidney disease)   . COPD (chronic obstructive pulmonary disease) ([place])   . Diabetes mellitus without complication ([place])   . Hypertension   . Sleep apnea    uses cpap  . Urinary obstruction 01/2016    PAST SURGICAL HISTORY:   Past Surgical History:  Procedure Laterality Date  . ABDOMINAL HYSTERECTOMY    . CYSTOSCOPY W/ RETROGRADES Bilateral 02/17/2016   Procedure: CYSTOSCOPY WITH RETROGRADE PYELOGRAM;  Surgeon: Hollice Espy, MD;  Location: [facility] ORS;  Service: Urology;  Laterality: Bilateral;  . CYSTOSCOPY WITH STENT PLACEMENT Left 01/21/2016   Procedure: CYSTOSCOPY WITH double J STENT PLACEMENT;  Surgeon: Franchot Gallo, MD;  Location: [facility] ORS;  Service: Urology;  Laterality: Left;  . KIDNEY SURGERY  01/21/2016   IR NEPHROSTOMY PLACEMENT LEFT   . PERIPHERAL VASCULAR CATHETERIZATION N/A 04/07/2016   Procedure: Glori Luis Cath Insertion;  Surgeon: Katha Cabal, MD;  Location: [facility];  Service: Cardiovascular;  Laterality: N/A;  . ROTATOR CUFF REPAIR     left  . TRANSURETHRAL RESECTION OF BLADDER TUMOR N/A 02/17/2016   Procedure: TRANSURETHRAL RESECTION OF BLADDER TUMOR (TURBT)-LARGE;  Surgeon: Hollice Espy, MD;  Location: [facility] OR[facility];  Service: Urology;  Laterality: N/A;  . URETEROSCOPY Left 02/17/2016   Procedure: URETEROSCOPY;  Surgeon: Hollice Espy, MD;  Location: [facility] OR[facility];  Service: Urology;  Laterality: Left;    SOCIAL HISTORY:   Social History  Substance Use Topics  . Smoking status: Former Smoker    Quit date: 07/20/1993  . Smokeless tobacco: Never Used     Comment: 01/22/2016   "  quit smoking many years ago "  . Alcohol use No    FAMILY HISTORY:   Family History  Problem Relation Age of Onset  . Diabetes Mother   . Cancer Father   . Breast cancer Neg Hx   . Kidney disease Neg Hx     DRUG ALLERGIES:  Allergies  Allergen Reactions  . Percocet [Oxycodone-Acetaminophen] Hives    REVIEW OF SYSTEMS:   Review of Systems  Unable to perform ROS: Mental status change    MEDICATIONS AT HOME:   Prior to Admission medications   Medication Sig Start Date End Date Taking? Authorizing Provider  Albuterol Sulfate (PROAIR RESPICLICK) 099 (90 Base) MCG/ACT AEPB Inhale 2 puffs into the lungs 4 (four) times daily as needed (wheezing).    Yes Historical Provider, MD  atorvastatin (LIPITOR) 40 MG tablet Take 40 mg by mouth daily at 6 PM.    Yes Historical Provider, MD    bisacodyl (DULCOLAX) 10 MG suppository Place 1 suppository (10 mg total) rectally daily as needed for moderate constipation. 03/11/16  Yes Theodoro Grist, MD  Cholecalciferol (VITAMIN D-3) 1000 units CAPS Take 1 capsule by mouth every morning.   Yes Historical Provider, MD  ciprofloxacin (CIPRO) 250 MG tablet Take 1 tablet (250 mg total) by mouth 2 (two) times daily. 04/14/16  Yes Noreene Filbert, MD  dronabinol (MARINOL) 2.5 MG capsule Take 1 capsule (2.5 mg total) by mouth 2 (two) times daily before lunch and supper. 03/11/16  Yes Theodoro Grist, MD  famotidine (PEPCID) 20 MG tablet Take 1 tablet (20 mg total) by mouth daily. 01/27/16  Yes Lavina Hamman, MD  ferrous sulfate 325 (65 FE) MG tablet Take 1 tablet (325 mg total) by mouth 2 (two) times daily with a meal. 01/27/16  Yes Lavina Hamman, MD  gabapentin (NEURONTIN) 100 MG capsule Take 100 mg by mouth 2 (two) times daily.   Yes Historical Provider, MD  HYDROcodone-acetaminophen (NORCO/VICODIN) 5-325 MG tablet Take 1-2 tablets by mouth every 6 (six) hours as needed for moderate pain. 03/11/16  Yes Theodoro Grist, MD  insulin aspart (NOVOLOG) 100 UNIT/ML injection Inject 0-15 Units into the skin 3 (three) times daily with meals. Patient taking differently: Inject 0-15 Units into the skin 3 (three) times daily with meals. If BS <60 call MD, if BS is 70-120=0u, 121-150=2u, 151-200=3, 201-250=5u, 251-300=8u, 301-350=11u, 351-400=15u, if >450 call MD 01/27/16  Yes Lavina Hamman, MD  lidocaine-prilocaine (EMLA) cream Apply 1 application topically as needed. 04/08/16  Yes Cammie Sickle, MD  mirabegron ER (MYRBETRIQ) 25 MG TB24 tablet Take 1 tablet (25 mg total) by mouth daily. 04/08/16  Yes Noreene Filbert, MD  Travoprost, BAK Free, (TRAVATAN) 0.004 % SOLN ophthalmic solution Place 1 drop into both eyes at bedtime.   Yes Historical Provider, MD  folic acid (FOLVITE) 1 MG tablet Take 1 tablet (1 mg total) by mouth daily. 01/27/16   Lavina Hamman, MD  NON  FORMULARY Place 1 Units into the nose at bedtime. CPAP Time of use 2100-0600    Historical Provider, MD      VITAL SIGNS:  Blood pressure 117/62, pulse 66, temperature (!) 95.6 F (35.3 C), resp. rate 18, height 5' 8"  (1.727 m), weight 169 lb (76.7 kg), SpO2 100 %.  PHYSICAL EXAMINATION:  Physical Exam  GENERAL:  75 y.o.-year-old patient lying in the bed with no acute distress. Obese. EYES: Pupils equal, round, reactive to light and accommodation. No scleral icterus. Extraocular muscles intact.  HEENT: Head atraumatic, normocephalic. Moist oral mucosa. NECK:  Supple, no jugular venous distention. No thyroid enlargement, no tenderness.  LUNGS: Normal breath sounds bilaterally, no wheezing, rales,rhonchi or crepitation. No use of accessory muscles of respiration.  CARDIOVASCULAR: S1, S2 normal. No murmurs, rubs, or gallops.  ABDOMEN: Soft, nontender, nondistended. Bowel sounds present. No organomegaly  or mass.  EXTREMITIES: No pedal edema, cyanosis, or clubbing.  NEUROLOGIC: Unable to exam.  PSYCHIATRIC: The patient is unresponsive and lethargic. SKIN: No obvious rash, lesion, or ulcer.   LABORATORY PANEL:   CBC  Recent Labs Lab 04/17/16 0841  WBC 7.3  HGB 7.2*  HCT 22.3*  PLT 137*   ------------------------------------------------------------------------------------------------------------------  Chemistries   Recent Labs Lab 04/17/16 0841  NA 130*  K 4.2  CL 104  CO2 14*  GLUCOSE 89  BUN 46*  CREATININE 6.42*  CALCIUM 8.4*  MG 1.5*  AST 19  ALT 9*  ALKPHOS 60  BILITOT 0.5   ------------------------------------------------------------------------------------------------------------------  Cardiac Enzymes  Recent Labs Lab 04/17/16 0841  TROPONINI 0.03*   ------------------------------------------------------------------------------------------------------------------  RADIOLOGY:  Ct Head Wo Contrast  Result Date: 04/17/2016 CLINICAL DATA:  Seizure.   Hypoglycemia EXAM: CT HEAD WITHOUT CONTRAST TECHNIQUE: Contiguous axial images were obtained from the base of the skull through the vertex without intravenous contrast. COMPARISON:  Dec 17, 2015 FINDINGS: Brain: There is mild diffuse atrophy. There is no intracranial mass, hemorrhage, extra-axial fluid collection, or midline shift. There is mild patchy small vessel disease in the centra semiovale bilaterally. There is evidence of a prior small lacunar infarct posterior limb of the right internal capsule. No acute infarct is evident. Vascular: No hyperdense vessels. There is calcification in each carotid siphon region. Skull: The bony calvarium appears intact. Sinuses/Orbits: Visualized paranasal sinuses are clear. Visualized orbits appear symmetric bilaterally. Other: Visualized mastoid air cells are clear. IMPRESSION: Mild atrophy with patchy periventricular small vessel disease. 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 No acute infarct evident. No hemorrhage, mass, or extra-axial fluid collection. There is calcification in each carotid siphon. Electronically Signed   By: Lowella Grip III M.D.   On: 04/17/2016 10:00   Dg Chest Port 1 View  Result Date: 04/17/2016 CLINICAL DATA:  Altered mental status EXAM: PORTABLE CHEST 1 VIEW COMPARISON:  03/04/2016 chest radiograph. FINDINGS: Right internal jugular MediPort terminates in the middle third of the superior vena cava. Stable cardiomediastinal silhouette with mild cardiomegaly and aortic atherosclerosis. No pneumothorax. No pleural effusion. No overt pulmonary edema. No acute consolidative airspace disease. IMPRESSION: 1. Stable mild cardiomegaly without overt pulmonary edema. 2. No active pulmonary disease. 3. Aortic atherosclerosis. Electronically Signed   By: Ilona Sorrel M.D.   On: 04/17/2016 10:31      IMPRESSION AND PLAN:   Seizure, new onset, possible due to hypoglycemia. Ativan prn, Seizure precaution  and aspiration precaution.  Hypoglycemia with history of diabetes. The patient was treated with D50, blood sugar increased to 119. I will start hypoglycemia protocol and sliding scale.  BS down to 26 just now, give D50 50 ml and start D5NS iv.  Hypotension. Given normal saline bolus now and follow-up vital sign closely.  Hypothermia. Continue warm blanket.  Acute renal failure on CKD stage IV. Possible due to poor oral intake and hypotension, but need to rule out worsening hydronephrosis. IV fluid to support, follow-up BMP and renal ultrasound.  Hyponatremia. Continue normal saline IV and a follow-up BMP. Hypomagnesemia. Give IV magnesium and follow-up level.   UTI.  The patient was treated with vancomycin and Zosyn in ED. Follow-up urine culture.  History of bladder cancer based left hydronephrosis. Repeat renal ultrasound for possible worsening of obstruction. Oncology consult.  Anemia of chronic disease. Hemoglobin decreased to 7.2. Follow-up hemoglobin.  The patient has multiple medical problems, multiple admissions and very poor prognosis. She  has high risk for cardiac pulmonary arrest. I discussed the patient critical condition and possible palliative care with her husband and other family members. Her husband wants full code for now but consider palliative and hospice care.  All the records are reviewed and case discussed with ED provider. Management plans discussed with the patient, family and they are in agreement.  CODE STATUS: full code.  TOTAL CRITICAL TIME TAKING CARE OF THIS PATIENT: 68 minutes.    Demetrios Loll M.D on 04/17/2016 at 11:32 AM  Between 7am to 6pm - Pager - [phone]  After 6pm go to [email or web address] - Proofreader  [facility]  Office  [phone]  CC: Primary care physician; Madelyn Brunner, MD   Note: This dictation was prepared with Dragon dictation along with smaller phrase technology. Any transcriptional  errors that result from this process are unintentional.

## 2016-04-18 DIAGNOSIS — E162 Hypoglycemia, unspecified: Secondary | ICD-10-CM | POA: Diagnosis not present

## 2016-04-18 DIAGNOSIS — R569 Unspecified convulsions: Secondary | ICD-10-CM

## 2016-04-18 LAB — CBC
HEMATOCRIT: 25.1 % — AB (ref 35.0–47.0)
HEMOGLOBIN: 8.2 g/dL — AB (ref 12.0–16.0)
MCH: 27.2 pg (ref 26.0–34.0)
MCHC: 32.6 g/dL (ref 32.0–36.0)
MCV: 83.6 fL (ref 80.0–100.0)
Platelets: 124 10*3/uL — ABNORMAL LOW (ref 150–440)
RBC: 3 MIL/uL — AB (ref 3.80–5.20)
RDW: 17.1 % — ABNORMAL HIGH (ref 11.5–14.5)
WBC: 10 10*3/uL (ref 3.6–11.0)

## 2016-04-18 LAB — GLUCOSE, CAPILLARY
GLUCOSE-CAPILLARY: 137 mg/dL — AB (ref 65–99)
GLUCOSE-CAPILLARY: 32 mg/dL — AB (ref 65–99)
GLUCOSE-CAPILLARY: 164 mg/dL — AB (ref 65–99)
GLUCOSE-CAPILLARY: 101 mg/dL — AB (ref 65–99)
GLUCOSE-CAPILLARY: 75 mg/dL (ref 65–99)
GLUCOSE-CAPILLARY: 88 mg/dL (ref 65–99)
GLUCOSE-CAPILLARY: 79 mg/dL (ref 65–99)
GLUCOSE-CAPILLARY: 89 mg/dL (ref 65–99)
GLUCOSE-CAPILLARY: 135 mg/dL — AB (ref 65–99)
GLUCOSE-CAPILLARY: 135 mg/dL — AB (ref 65–99)
Glucose-Capillary: 105 mg/dL — ABNORMAL HIGH (ref 65–99)
Glucose-Capillary: 121 mg/dL — ABNORMAL HIGH (ref 65–99)
Glucose-Capillary: 151 mg/dL — ABNORMAL HIGH (ref 65–99)
Glucose-Capillary: 32 mg/dL — CL (ref 65–99)
Glucose-Capillary: 38 mg/dL — CL (ref 65–99)
Glucose-Capillary: 51 mg/dL — ABNORMAL LOW (ref 65–99)
Glucose-Capillary: 55 mg/dL — ABNORMAL LOW (ref 65–99)
Glucose-Capillary: 57 mg/dL — ABNORMAL LOW (ref 65–99)
Glucose-Capillary: 70 mg/dL (ref 65–99)

## 2016-04-18 LAB — OCCULT BLOOD X 1 CARD TO LAB, STOOL: Fecal Occult Bld: NEGATIVE

## 2016-04-18 LAB — BASIC METABOLIC PANEL
ANION GAP: 4 — AB (ref 5–15)
ANION GAP: 6 (ref 5–15)
Anion gap: 8 (ref 5–15)
BUN: 32 mg/dL — AB (ref 6–20)
BUN: 35 mg/dL — ABNORMAL HIGH (ref 6–20)
BUN: 39 mg/dL — ABNORMAL HIGH (ref 6–20)
CHLORIDE: 105 mmol/L (ref 101–111)
CO2: 15 mmol/L — AB (ref 22–32)
CO2: 17 mmol/L — AB (ref 22–32)
CO2: 18 mmol/L — AB (ref 22–32)
Calcium: 7.5 mg/dL — ABNORMAL LOW (ref 8.9–10.3)
Calcium: 7.7 mg/dL — ABNORMAL LOW (ref 8.9–10.3)
Calcium: 7.9 mg/dL — ABNORMAL LOW (ref 8.9–10.3)
Chloride: 106 mmol/L (ref 101–111)
Chloride: 109 mmol/L (ref 101–111)
Creatinine, Ser: 3.57 mg/dL — ABNORMAL HIGH (ref 0.44–1.00)
Creatinine, Ser: 4.13 mg/dL — ABNORMAL HIGH (ref 0.44–1.00)
Creatinine, Ser: 4.97 mg/dL — ABNORMAL HIGH (ref 0.44–1.00)
GFR calc Af Amer: 11 mL/min — ABNORMAL LOW (ref >60)
GFR calc Af Amer: 13 mL/min — ABNORMAL LOW (ref >60)
GFR calc Af Amer: 9 mL/min — ABNORMAL LOW (ref >60)
GFR calc non Af Amer: 11 mL/min — ABNORMAL LOW (ref >60)
GFR calc non Af Amer: 8 mL/min — ABNORMAL LOW (ref >60)
GFR, EST NON AFRICAN AMERICAN: 10 mL/min — AB (ref >60)
GLUCOSE: 118 mg/dL — AB (ref 65–99)
GLUCOSE: 158 mg/dL — AB (ref 65–99)
GLUCOSE: 36 mg/dL — AB (ref 65–99)
POTASSIUM: 4.1 mmol/L (ref 3.5–5.1)
POTASSIUM: 4.4 mmol/L (ref 3.5–5.1)
POTASSIUM: 4.4 mmol/L (ref 3.5–5.1)
Sodium: 128 mmol/L — ABNORMAL LOW (ref 135–145)
Sodium: 129 mmol/L — ABNORMAL LOW (ref 135–145)
Sodium: 131 mmol/L — ABNORMAL LOW (ref 135–145)

## 2016-04-18 LAB — URINE CULTURE: CULTURE: NO GROWTH

## 2016-04-18 MED ORDER — GLUCAGON HCL (RDNA) 1 MG IJ SOLR
1.0000 mg | INTRAMUSCULAR | Status: DC
  Filled 2016-04-18 (×8): qty 1

## 2016-04-18 MED ORDER — GLUCAGON HCL (RDNA) 1 MG IJ SOLR
1.0000 mg | Freq: Once | INTRAMUSCULAR | Status: AC | PRN
  Administered 2016-04-18: 1 mg via INTRAVENOUS
  Filled 2016-04-18: qty 1

## 2016-04-18 MED ORDER — STERILE WATER FOR INJECTION IJ SOLN
INTRAMUSCULAR | Status: AC
Start: 2016-04-18 — End: 2016-04-18
  Administered 2016-04-18: 10 mL
  Filled 2016-04-18: qty 10

## 2016-04-18 MED ORDER — DEXTROSE 50 % IV SOLN
1.0000 | Freq: Once | INTRAVENOUS | Status: AC
  Administered 2016-04-18: 50 mL via INTRAVENOUS

## 2016-04-18 MED ORDER — DEXTROSE 50 % IV SOLN
INTRAVENOUS | Status: AC
  Administered 2016-04-18: 01:00:00
  Filled 2016-04-18: qty 50

## 2016-04-18 MED ORDER — GLUCAGON HCL (RDNA) 1 MG IJ SOLR
1.0000 mg | INTRAMUSCULAR | Status: DC
  Filled 2016-04-18 (×2): qty 1

## 2016-04-18 MED ORDER — DEXTROSE 50 % IV SOLN
INTRAVENOUS | Status: AC
Start: 2016-04-18 — End: 2016-04-18
  Administered 2016-04-18: 50 mL
  Filled 2016-04-18: qty 50

## 2016-04-18 MED ORDER — STERILE WATER FOR INJECTION IJ SOLN
INTRAMUSCULAR | Status: AC
Start: 2016-04-18 — End: 2016-04-18
  Filled 2016-04-18: qty 10

## 2016-04-18 MED ORDER — DEXTROSE 50 % IV SOLN
INTRAVENOUS | Status: AC
  Administered 2016-04-18: 50 mL
  Filled 2016-04-18: qty 50

## 2016-04-18 NOTE — Progress Notes (Addendum)
[facility] NAME: Barbara [name]    MRN#:  [id number]  DATE OF BIRTH:  03-15-1941  SUBJECTIVE:  Hospital Day: 1 day Barbara [name] is a 75 y.o. female presenting with Seizures and Hypoglycemia .   Overnight events: No acute overnight events, blood sugar remained stable Interval Events: No complaints this morning  REVIEW OF SYSTEMS:  CONSTITUTIONAL: No fever, fatigue or weakness.  EYES: No blurred or double vision.  EARS, NOSE, AND THROAT: No tinnitus or ear pain.  RESPIRATORY: No cough, shortness of breath, wheezing or hemoptysis.  CARDIOVASCULAR: No chest pain, orthopnea, edema.  GASTROINTESTINAL: No nausea, vomiting, diarrhea or abdominal pain.  GENITOURINARY: No dysuria, hematuria.  ENDOCRINE: No polyuria, nocturia,  HEMATOLOGY: No anemia, easy bruising or bleeding SKIN: No rash or lesion. MUSCULOSKELETAL: No joint pain or arthritis.   NEUROLOGIC: No tingling, numbness, weakness.  PSYCHIATRY: No anxiety or depression.   DRUG ALLERGIES:   Allergies  Allergen Reactions  . Percocet [Oxycodone-Acetaminophen] Hives    VITALS:  Blood pressure 138/64, pulse 69, temperature 98.1 F (36.7 C), temperature source Oral, resp. rate 17, height 5' 8"  (1.727 m), weight 80.6 kg (177 lb 11.1 oz), SpO2 100 %.  PHYSICAL EXAMINATION:  VITAL SIGNS: Vitals:   04/18/16 0900 04/18/16 1000  BP: (!) 184/66 138/64  Pulse: 72 69  Resp: (!) 23 17  Temp:     GENERAL:75 y.o.female currently in no acute distress.  HEAD: Normocephalic, atraumatic.  EYES: Pupils equal, round, reactive to light. Extraocular muscles intact. No scleral icterus.  MOUTH: Moist mucosal membrane. Dentition intact. No abscess noted.  EAR, NOSE, THROAT: Clear without exudates. No external lesions.  NECK: Supple. No thyromegaly. No nodules. No JVD.  PULMONARY: Clear to ascultation, without wheeze rails or rhonci. No use of accessory muscles, Good respiratory effort.  good air entry bilaterally CHEST: Nontender to palpation.  CARDIOVASCULAR: S1 and S2. Regular rate and rhythm. No murmurs, rubs, or gallops. No edema. Pedal pulses 2+ bilaterally.  GASTROINTESTINAL: Soft, nontender, nondistended. No masses. Positive bowel sounds. No hepatosplenomegaly.  MUSCULOSKELETAL: No swelling, clubbing, or edema. Range of motion full in all extremities.  NEUROLOGIC: Cranial nerves II through XII are intact. No gross focal neurological deficits. Sensation intact. Reflexes intact.  SKIN: No ulceration, lesions, rashes, or cyanosis. Skin warm and dry. Turgor intact.  PSYCHIATRIC: Mood, affect flat. The patient is awake, alert and oriented x 3. Insight, judgment intact.      LABORATORY PANEL:   CBC  Recent Labs Lab 04/18/16 0613  WBC 10.0  HGB 8.2*  HCT 25.1*  PLT 124*   ------------------------------------------------------------------------------------------------------------------  Chemistries   Recent Labs Lab 04/17/16 0841 04/17/16 2035 04/17/16 2336  NA 130* 132* 131*  K 4.2 4.1 4.1  CL 104 109 109  CO2 14* 16* 18*  GLUCOSE 89 76 36*  BUN 46* 40* 39*  CREATININE 6.42* 5.21* 4.97*  CALCIUM 8.4* 7.4* 7.5*  MG 1.5* 1.6*  --   AST 19  --   --   ALT 9*  --   --   ALKPHOS 60  --   --   BILITOT 0.5  --   --    ------------------------------------------------------------------------------------------------------------------  Cardiac Enzymes  Recent Labs Lab 04/17/16 0841  TROPONINI 0.03*   ------------------------------------------------------------------------------------------------------------------  RADIOLOGY:  Ct Head Wo Contrast  Result Date: 04/17/2016 CLINICAL DATA:  Seizure.  Hypoglycemia EXAM: CT HEAD WITHOUT CONTRAST TECHNIQUE: Contiguous axial images were obtained from the base of the skull  through the vertex without intravenous contrast. COMPARISON:  Dec 17, 2015 FINDINGS: Brain: There is mild diffuse atrophy. There is no  intracranial mass, hemorrhage, extra-axial fluid collection, or midline shift. There is mild patchy small vessel disease in the centra semiovale bilaterally. There is evidence of a prior small lacunar infarct posterior limb of the right internal capsule. No acute infarct is evident. Vascular: No hyperdense vessels. There is calcification in each carotid siphon region. Skull: The bony calvarium appears intact. Sinuses/Orbits: Visualized paranasal sinuses are clear. Visualized orbits appear symmetric bilaterally. Other: Visualized mastoid air cells are clear. IMPRESSION: Mild atrophy with patchy periventricular small vessel disease. 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 No acute infarct evident. No hemorrhage, mass, or extra-axial fluid collection. There is calcification in each carotid siphon. Electronically Signed   By: Lowella Grip III M.D.   On: 04/17/2016 10:00   Dg Chest Port 1 View  Result Date: 04/17/2016 CLINICAL DATA:  Altered mental status EXAM: PORTABLE CHEST 1 VIEW COMPARISON:  03/04/2016 chest radiograph. FINDINGS: Right internal jugular MediPort terminates in the middle third of the superior vena cava. Stable cardiomediastinal silhouette with mild cardiomegaly and aortic atherosclerosis. No pneumothorax. No pleural effusion. No overt pulmonary edema. No acute consolidative airspace disease. IMPRESSION: 1. Stable mild cardiomegaly without overt pulmonary edema. 2. No active pulmonary disease. 3. Aortic atherosclerosis. Electronically Signed   By: Ilona Sorrel M.D.   On: 04/17/2016 10:31    EKG:   Orders placed or performed during the hospital encounter of 04/17/16  . ED EKG  . ED EKG    ASSESSMENT AND PLAN:   Barbara [name] is a 75 y.o. female presenting with Seizures and Hypoglycemia . Admitted 04/17/2016 : Day #: 1 day 1. Hypoglycemia type 2 diabetes: Patient remains on D10 however, blood sugars have stabilized 2. Acute renal failure:  Appreciate nephrology input likely secondary to acute tubular necrosis 3. Seizure: In the setting of hypoglycemia 4. Urinary tract infection: Ceftriaxone and follow culture data 5. Bladder cancer: Oncology appreciated  Disposition: If blood glucose remains greater than 100 can transfer out of intensive care unit, get physical therapy evaluation  All the records are reviewed and case discussed with Care Management/Social Workerr. Management plans discussed with the patient, family and they are in agreement.  CODE STATUS: full TOTAL TIME TAKING CARE OF THIS PATIENT: 28 minutes.   POSSIBLE D/C IN 1-2DAYS, DEPENDING ON CLINICAL CONDITION.   Barbara [name],  [name].D on 04/18/2016 at 11:50 AM  Between 7am to 6pm - Pager - [phone]  After 6pm: House Pager: - [facility]  Office  [phone]  CC: Primary care physician; Madelyn Brunner, MD

## 2016-04-18 NOTE — Progress Notes (Signed)
Pt has remained alert and oriented x3 with disorientation to her situation. No c/o pain. Pt has remained on RA-sats at 100%. NSR on cardiac monitor. BP stable-diastolic slightly low in the 50s.  Bld sugars have remained WNL. Pt with orders to transfer to the floor.

## 2016-04-18 NOTE — Consult Note (Signed)
PULMONARY / CRITICAL CARE MEDICINE   Name: Barbara [name] MRN: [id number] DOB: 1941/05/22    ADMISSION DATE:  04/17/2016   CONSULTATION DATE: 04/18/2016  REFERRING MD: Dr. Harvie Bridge  CHIEF COMPLAINT:  Low blood glucose and hypotension  HISTORY OF PRESENT ILLNESS:   This is a 75 year old African-American female with a past medical history of chronic kidney disease stage III, bladder cancer, currently receiving chemotherapy, COPD, type 2 diabetes, hypertension, obstructive sleep apnea, urinary incontinence and anemia of chronic disease who presented to the ED via EMS with hypoglycemia and seizure. EMS was called because patient had a seizure and decrease in level of consciousness. Upon EMS arrival, patient's blood sugar was 30. She was given IM glucagon and some oral dextrose and her blood sugar improved as well as her mental status. In the ED, patient's repeat blood sugar was 49 mg/dL. She was diaphoretic but alert. She continued to be hypoglycemic despite treatment, but no facet seizure activity. She will became hypotensive as well with systolic blood pressures in the low 80s. She was started on D10 at 134m/hr. PCCM was consulted for further management. Patient remains hypoglycemic with blood sugars ranging from 30-80 mg/dL. Stop. She is asymptomatic. Had repeat labs showed a hemoglobin of 6.14 which she is receiving 2 units of packed red blood cells. It is unclear why patient is in refractory hypoglycemia. She has no prior seizures history. She is on sliding scale for her diabetes management and currently not taking any oral hypoglycemic agents. Her UA was abnormal. Her chest x-ray was unremarkable. She was started on vancomycin and Zosyn the ED for possible urosepsis.  PAST MEDICAL HISTORY :  She  has a past medical history of Anemia associated with chronic renal failure; Arthritis; Chronic kidney disease; CKD (chronic kidney disease); COPD (chronic obstructive pulmonary disease) ([facility];  Diabetes mellitus without complication ([place] [place]; Hypertension; Sleep apnea; and Urinary obstruction (01/2016).  PAST SURGICAL HISTORY: She  has a past surgical history that includes Abdominal hysterectomy; Rotator cuff repair; Kidney surgery (01/21/2016); Cystoscopy with stent placement (Left, 01/21/2016); Transurethral resection of bladder tumor (N/A, 02/17/2016); Ureteroscopy (Left, 02/17/2016); Cystoscopy w/ retrogrades (Bilateral, 02/17/2016); and Cardiac catheterization (N/A, 04/07/2016).  Allergies  Allergen Reactions  . Percocet [Oxycodone-Acetaminophen] Hives    No current facility-administered medications on file prior to encounter.    Current Outpatient Prescriptions on File Prior to Encounter  Medication Sig  . Albuterol Sulfate (PROAIR RESPICLICK) 1546(90 Base) MCG/ACT AEPB Inhale 2 puffs into the lungs 4 (four) times daily as needed (wheezing).   .Marland Kitchenatorvastatin (LIPITOR) 40 MG tablet Take 40 mg by mouth daily at 6 PM.   . bisacodyl (DULCOLAX) 10 MG suppository Place 1 suppository (10 mg total) rectally daily as needed for moderate constipation.  . Cholecalciferol (VITAMIN D-3) 1000 units CAPS Take 1 capsule by mouth every morning.  . ciprofloxacin (CIPRO) 250 MG tablet Take 1 tablet (250 mg total) by mouth 2 (two) times daily.  .Marland Kitchendronabinol (MARINOL) 2.5 MG capsule Take 1 capsule (2.5 mg total) by mouth 2 (two) times daily before lunch and supper.  . famotidine (PEPCID) 20 MG tablet Take 1 tablet (20 mg total) by mouth daily.  . ferrous sulfate 325 (65 FE) MG tablet Take 1 tablet (325 mg total) by mouth 2 (two) times daily with a meal.  . gabapentin (NEURONTIN) 100 MG capsule Take 100 mg by mouth 2 (two) times daily.  .Marland KitchenHYDROcodone-acetaminophen (NORCO/VICODIN) 5-325 MG tablet Take 1-2 tablets by mouth every 6 (six) hours  as needed for moderate pain.  Marland Kitchen insulin aspart (NOVOLOG) 100 UNIT/ML injection Inject 0-15 Units into the skin 3 (three) times daily with meals. (Patient taking  differently: Inject 0-15 Units into the skin 3 (three) times daily with meals. If BS <60 call MD, if BS is 70-120=0u, 121-150=2u, 151-200=3, 201-250=5u, 251-300=8u, 301-350=11u, 351-400=15u, if >450 call MD)  . lidocaine-prilocaine (EMLA) cream Apply 1 application topically as needed.  . mirabegron ER (MYRBETRIQ) 25 MG TB24 tablet Take 1 tablet (25 mg total) by mouth daily.  . Travoprost, BAK Free, (TRAVATAN) 0.004 % SOLN ophthalmic solution Place 1 drop into both eyes at bedtime.  . folic acid (FOLVITE) 1 MG tablet Take 1 tablet (1 mg total) by mouth daily.  . NON FORMULARY Place 1 Units into the nose at bedtime. CPAP Time of use 2100-0600    FAMILY HISTORY:  Her indicated that the status of her mother is unknown. She indicated that the status of her father is unknown. She indicated that the status of her neg hx is unknown.    SOCIAL HISTORY: She  reports that she quit smoking about 22 years ago. She has never used smokeless tobacco. She reports that she does not drink alcohol or use drugs.  REVIEW OF SYSTEMS:   Constitutional: Negative for fever and chills.  HENT: Negative for congestion and rhinorrhea.  Eyes: Negative for redness and visual disturbance.  Respiratory: Negative for shortness of breath and wheezing.  Cardiovascular: Negative for chest pain and palpitations.  Gastrointestinal: Negative  for nausea , vomiting and abdominal pain but positive for loose stools Genitourinary: Negative for dysuria and urgency.  Endocrine: Denies polyuria, polyphagia and heat intolerance Musculoskeletal: Negative for myalgias and arthralgias.  Skin: Negative for pallor and wound.  Neurological: Negative for dizziness and headaches but positive for intermittent loss of consciousness and seizure  SUBJECTIVE:   VITAL SIGNS: BP (!) 98/49   Pulse 60   Temp 97.7 F (36.5 C) (Axillary)   Resp 18   Ht 5' 8"  (1.727 m)   Wt 177 lb 11.1 oz (80.6 kg)   SpO2 100%   BMI 27.02 kg/m    HEMODYNAMICS:    VENTILATOR SETTINGS:    INTAKE / OUTPUT: I/O last 3 completed shifts: In: 406.3 [P.O.:200; I.V.:156.3; IV Piggyback:50] Out: 425 [Urine:425]  PHYSICAL EXAMINATION: General:  Appears acutely ill, well-developed, no acute distress Neuro: Alert to person and place, moves all extremities, no other focal deficits HEENT:  Head is normocephalic and atraumatic. ENT nasal passages patent, trachea midline and no JVD Cardiovascular:  Rate and rhythm regular, S1, S2 audible, no murmurs Lungs: Diminished airflow in all lung fields, no wheezes or rhonchi, breath sounds were diminished in the bases Abdomen:  Nondistended, normal bowel sounds, soft and nontender Musculoskeletal:  Positive range of motion in upper and lower extremities Extremities: +2 pulses, no cyanosis Skin:  Warm and dry  LABS:  BMET  Recent Labs Lab 04/17/16 0841 04/17/16 2035  NA 130* 132*  K 4.2 4.1  CL 104 109  CO2 14* 16*  BUN 46* 40*  CREATININE 6.42* 5.21*  GLUCOSE 89 76    Electrolytes  Recent Labs Lab 04/17/16 0841 04/17/16 2035  CALCIUM 8.4* 7.4*  MG 1.5* 1.6*  PHOS  --  4.7*    CBC  Recent Labs Lab 04/17/16 0841 04/17/16 2035 04/17/16 2336  WBC 7.3 7.5 7.9  HGB 7.2* 6.1* 6.5*  HCT 22.3* 19.5* 20.7*  PLT 137* 123* 132*    Coag's No results  for input(s): APTT, INR in the last 168 hours.  Sepsis Markers  Recent Labs Lab 04/17/16 0910 04/17/16 1515  LATICACIDVEN 1.3 0.8    ABG No results for input(s): PHART, PCO2ART, PO2ART in the last 168 hours.  Liver Enzymes  Recent Labs Lab 04/17/16 0841  AST 19  ALT 9*  ALKPHOS 60  BILITOT 0.5  ALBUMIN 2.5*    Cardiac Enzymes  Recent Labs Lab 04/17/16 0841  TROPONINI 0.03*    Glucose  Recent Labs Lab 04/17/16 2005 04/17/16 2030 04/17/16 2110 04/17/16 2116 04/17/16 2236 04/17/16 2335  GLUCAP 43* 86 49* 84 83 35*    Imaging Ct Head Wo Contrast  Result Date: 04/17/2016 CLINICAL DATA:   Seizure.  Hypoglycemia EXAM: CT HEAD WITHOUT CONTRAST TECHNIQUE: Contiguous axial images were obtained from the base of the skull through the vertex without intravenous contrast. COMPARISON:  Dec 17, 2015 FINDINGS: Brain: There is mild diffuse atrophy. There is no intracranial mass, hemorrhage, extra-axial fluid collection, or midline shift. There is mild patchy small vessel disease in the centra semiovale bilaterally. There is evidence of a prior small lacunar infarct posterior limb of the right internal capsule. No acute infarct is evident. Vascular: No hyperdense vessels. There is calcification in each carotid siphon region. Skull: The bony calvarium appears intact. Sinuses/Orbits: Visualized paranasal sinuses are clear. Visualized orbits appear symmetric bilaterally. Other: Visualized mastoid air cells are clear. IMPRESSION: Mild atrophy with patchy periventricular small vessel disease. 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 No acute infarct evident. No hemorrhage, mass, or extra-axial fluid collection. There is calcification in each carotid siphon. Electronically Signed   By: Lowella Grip III M.D.   On: 04/17/2016 10:00   Dg Chest Port 1 View  Result Date: 04/17/2016 CLINICAL DATA:  Altered mental status EXAM: PORTABLE CHEST 1 VIEW COMPARISON:  03/04/2016 chest radiograph. FINDINGS: Right internal jugular MediPort terminates in the middle third of the superior vena cava. Stable cardiomediastinal silhouette with mild cardiomegaly and aortic atherosclerosis. No pneumothorax. No pleural effusion. No overt pulmonary edema. No acute consolidative airspace disease. IMPRESSION: 1. Stable mild cardiomegaly without overt pulmonary edema. 2. No active pulmonary disease. 3. Aortic atherosclerosis. Electronically Signed   By: Ilona Sorrel M.D.   On: 04/17/2016 10:31     STUDIES:  None  CULTURES: 04/08/2016 Blood cultures 2 Urine culture    ANTIBIOTICS: Vancomycin 04/17/2016> Zosyn 04/17/2016>  SIGNIFICANT EVENTS: 04/17/2016: ED with seizure and hypoglycemia, admitted with hypoglycemia and seizure.  LINES/TUBES: Right chest wall MediPort Peripheral IVs  DISCUSSION: 75 year old African-American female with multiple comorbidities presenting with refractory hypoglycemia causes unknown, hypotension and acute anemia.  ASSESSMENT / PLAN:  PULMONARY A: History of obstructive sleep apnea, history of obstructive sleep apnea. History of COPD-on rescue inhalers at home P:   BiPAP as needed at bedtime. Supplemental oxygen to keep oxygen saturation greater than 90%  CARDIOVASCULAR A:  Hypotension History of hypertension P:  IV fluids Monitor hemodynamics per ICU protocol Hold home antihypertensives in light of low blood pressure.  RENAL A:   Acute on chronic kidney failure-creatinine up to 5; baseline 2.3 Hyponatremia Hypomagnesemia History of urinary retention P:   Trend creatinine Nephrology consulted, awaiting input Monitor and correct electrolyte abnormalities Foley to gravity. Strict I's and O's monitoring Patient may require dialysis if creatinine continues to trend up  GASTROINTESTINAL A:   No acute issues P:   Famotidine 20 mg daily for GI prophylaxis  HEMATOLOGIC A:   Acute on  chronic anemia-hemoglobin down to 6.1 P:  Transfuse 2 units of packed red blood cells Hemoglobin and hematocrit posttransfusion  INFECTIOUS A:   UTI-no leukocytosis and no fever P:   Continue empiric antibiotics. Follow-up cultures  ENDOCRINE A:   Refractory hypoglycemia History of type 2 diabetes  P:   Hold insulin. Monitor blood glucose level every hour and chemistries every 4 hours Dextrose 10% at 125 MLS per hour Glucagon 1 mg IM when necessary Encouraged and lateral intake as tolerated We'll consult endocrinology if patient's blood sugars do not stabilize today  NEUROLOGIC A:   New onset  seizures-likely due to hypoglycemia Metabolic encephalopathy secondary to hypoglycemia P:   RASS goal: n/a Seizure precautions Treat hypoglycemia Monitor neuro status   Disposition and family update: No family at bedside. Will update when available  Total critical care time =60 minutes Magdalene S. [facility] ANP-BC Pulmonary and [place] Pager [phone] or [phone] 04/18/2016, 12:02 AM   Pt. Seen and examined with NP, agree with assessment and plan except as where amended.  Acute hypoglycemia, likely due to ARF while taking insulin. Will start scheduled glucagon, seizures and hypoglycemia appear better controlled.   Marda Stalker, M.D.  04/18/2016  Critical Care Attestation.  I have personally obtained a history, examined the patient, evaluated laboratory and imaging results, formulated the assessment and plan and placed orders. The Patient requires high complexity decision making for assessment and support, frequent evaluation and titration of therapies, application of advanced monitoring technologies and extensive interpretation of multiple databases. The patient has critical illness that could lead imminently to failure of 1 or more organ systems and requires the highest level of physician preparedness to intervene.  Critical Care Time devoted to patient care services described in this note is 35 minutes and is exclusive of time spent in procedures.

## 2016-04-18 NOTE — Progress Notes (Signed)
[facility]  ROUNDING NOTE   Subjective:  Patient seen at bedside. BUN down to 39 and creatinine down to 4.9. Urine output was 2.2 L over the preceding 24 hours. Patient also received blood products and hemoglobin up to 8.2.   Objective:  Vital signs in last 24 hours:  Temp:  [96.1 F (35.6 C)-98.6 F (37 C)] 98.2 F (36.8 C) (09/30 0450) Pulse Rate:  [59-70] 66 (09/30 0600) Resp:  [15-22] 16 (09/30 0600) BP: (83-123)/(46-74) 100/50 (09/30 0600) SpO2:  [94 %-100 %] 94 % (09/30 0942) Weight:  [80.6 kg (177 lb 11.1 oz)] 80.6 kg (177 lb 11.1 oz) (09/29 1645)  Weight change:  Filed Weights   04/17/16 0929 04/17/16 1645  Weight: 76.7 kg (169 lb) 80.6 kg (177 lb 11.1 oz)    Intake/Output: I/O last 3 completed shifts: In: 2606.3 [P.O.:200; I.V.:1681.3; Blood:625; IV Piggyback:100] Out: 2270 [Urine:2270]   Intake/Output this shift:  Total I/O In: 240 [P.O.:240] Out: -   Physical Exam: General: No acute distress  Head: Normocephalic, atraumatic. Moist oral mucosal membranes  Eyes: Anicteric  Neck: Supple, trachea midline  Lungs:  Clear to auscultation, normal effort  Heart: S1S2 no rubs  Abdomen:  Soft, nontender, BS present  Extremities: no peripheral edema.  Neurologic: Nonfocal, moving all four extremities  Skin: No lesions       Basic Metabolic Panel:  Recent Labs Lab 04/17/16 0841 04/17/16 2035 04/17/16 2336  NA 130* 132* 131*  K 4.2 4.1 4.1  CL 104 109 109  CO2 14* 16* 18*  GLUCOSE 89 76 36*  BUN 46* 40* 39*  CREATININE 6.42* 5.21* 4.97*  CALCIUM 8.4* 7.4* 7.5*  MG 1.5* 1.6*  --   PHOS  --  4.7*  --     Liver Function Tests:  Recent Labs Lab 04/17/16 0841  AST 19  ALT 9*  ALKPHOS 60  BILITOT 0.5  PROT 7.9  ALBUMIN 2.5*   No results for input(s): LIPASE, AMYLASE in the last 168 hours. No results for input(s): AMMONIA in the last 168 hours.  CBC:  Recent Labs Lab 04/17/16 0841 04/17/16 2035 04/17/16 2336  04/18/16 0613  WBC 7.3 7.5 7.9 10.0  NEUTROABS 6.8*  --   --   --   HGB 7.2* 6.1* 6.5* 8.2*  HCT 22.3* 19.5* 20.7* 25.1*  MCV 83.0 83.8 83.9 83.6  PLT 137* 123* 132* 124*    Cardiac Enzymes:  Recent Labs Lab 04/17/16 0841  TROPONINI 0.03*    BNP: Invalid input(s): POCBNP  CBG:  Recent Labs Lab 04/18/16 0714 04/18/16 0744 04/18/16 0812 04/18/16 0858 04/18/16 1008  GLUCAP 75 70 88 79 60    Microbiology: Results for orders placed or performed during the hospital encounter of 04/17/16  Blood Culture (routine x 2)     Status: None (Preliminary result)   Collection Time: 04/17/16  8:42 AM  Result Value Ref Range Status   Specimen Description PORTA CATH  Final   Special Requests AER 10 CC ANA 10CC  Final   Culture NO GROWTH < 24 HOURS  Final   Report Status PENDING  Incomplete  Blood Culture (routine x 2)     Status: None (Preliminary result)   Collection Time: 04/17/16  8:42 AM  Result Value Ref Range Status   Specimen Description BLOOD RIGHT ARM  Final   Special Requests AER 10CC ANA 10CC  Final   Culture NO GROWTH < 24 HOURS  Final   Report Status PENDING  Incomplete    Coagulation Studies: No results for input(s): LABPROT, INR in the last 72 hours.  Urinalysis:  Recent Labs  04/17/16 0835  COLORURINE YELLOW*  LABSPEC 1.008  PHURINE 7.0  GLUCOSEU 150*  HGBUR 3+*  BILIRUBINUR NEGATIVE  KETONESUR NEGATIVE  PROTEINUR 100*  NITRITE NEGATIVE  LEUKOCYTESUR 3+*      Imaging: Ct Head Wo Contrast  Result Date: 04/17/2016 CLINICAL DATA:  Seizure.  Hypoglycemia EXAM: CT HEAD WITHOUT CONTRAST TECHNIQUE: Contiguous axial images were obtained from the base of the skull through the vertex without intravenous contrast. COMPARISON:  Dec 17, 2015 FINDINGS: Brain: There is mild diffuse atrophy. There is no intracranial mass, hemorrhage, extra-axial fluid collection, or midline shift. There is mild patchy small vessel disease in the centra semiovale bilaterally.  There is evidence of a prior small lacunar infarct posterior limb of the right internal capsule. No acute infarct is evident. Vascular: No hyperdense vessels. There is calcification in each carotid siphon region. Skull: The bony calvarium appears intact. Sinuses/Orbits: Visualized paranasal sinuses are clear. Visualized orbits appear symmetric bilaterally. Other: Visualized mastoid air cells are clear. IMPRESSION: Mild atrophy with patchy periventricular small vessel disease. 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 No acute infarct evident. No hemorrhage, mass, or extra-axial fluid collection. There is calcification in each carotid siphon. Electronically Signed   By: Lowella Grip III M.D.   On: 04/17/2016 10:00   Dg Chest Port 1 View  Result Date: 04/17/2016 CLINICAL DATA:  Altered mental status EXAM: PORTABLE CHEST 1 VIEW COMPARISON:  03/04/2016 chest radiograph. FINDINGS: Right internal jugular MediPort terminates in the middle third of the superior vena cava. Stable cardiomediastinal silhouette with mild cardiomegaly and aortic atherosclerosis. No pneumothorax. No pleural effusion. No overt pulmonary edema. No acute consolidative airspace disease. IMPRESSION: 1. Stable mild cardiomegaly without overt pulmonary edema. 2. No active pulmonary disease. 3. Aortic atherosclerosis. Electronically Signed   By: Ilona Sorrel M.D.   On: 04/17/2016 10:31     Medications:   . dextrose 125 mL/hr at 04/18/16 0612   . atorvastatin  40 mg Oral q1800  . cefTRIAXone (ROCEPHIN)  IV  1 g Intravenous Q24H  . dronabinol  2.5 mg Oral BID AC  . famotidine  20 mg Oral Daily  . ferrous sulfate  325 mg Oral BID WC  . folic acid  1 mg Oral Daily  . gabapentin  100 mg Oral BID  . glucagon  1 mg Intravenous Q30 min  . heparin  5,000 Units Subcutaneous Q8H  . insulin aspart  0-5 Units Subcutaneous QHS  . insulin aspart  0-9 Units Subcutaneous TID WC  . mirabegron ER  25 mg  Oral Daily  . sodium chloride  1,000 mL Intravenous Once  . sterile water (preservative free)       albuterol, bisacodyl, HYDROcodone-acetaminophen, LORazepam, ondansetron **OR** ondansetron (ZOFRAN) IV  Assessment/ Plan:  75 y.o. female with long-standing hypertension, diabetes type 2, hyperlipidemia, osteoarthritis, obstructive sleep apnea, bladder cancer 8/17 s/p TURBT and radiation therapy/chemo with gemcitabine.  1. Acute renal failure/chronic kidney disease stage IV.  Baseline creatinine 2.3 EGFR 23. Acute renal failure now likely related to poor by mouth intake status post chemotherapy treatment with gemcitabine.  -  Renal function has improved significantly. Creatinine down to 4.9 with urine output of 2.2 L over the preceding 24 hours. No acute indication for dialysis. Continue IV fluid hydration and supportive care. Avoid nephrotoxins as possible.  2. Anemia of  chronic kidney disease. Hemoglobin dropped to 6.1 yesterday. Patient has received blood transfusion and hemoglobin currently up to 8.2. Continue to monitor CBC.  3. urinary tract infection. Too numerous to count RBCs and WBCs noted in the urine. She has been started on ceftriaxone.     LOS: 1 Bria Sparr 9/30/201710:19 AM

## 2016-04-18 NOTE — Progress Notes (Signed)
Pt was given 2 units of prbc due to hemoglobin of 6.5. Occult stool was done with negative results. After blood was given, hemoglobin went to 8.2.  However, pt's blood sugar was very labile last night.  Several amps of D50 were given according to the hypoglycemic protocol and blood sugar would bounce back.  But, it would then decrease into the 30's.  Pt would still be alert, confused to time but could accurately answer most questions even with the blood sugar in the 30's.  Magdelene, NP aware of all issues.

## 2016-04-19 LAB — BASIC METABOLIC PANEL
ANION GAP: 3 — AB (ref 5–15)
ANION GAP: 6 (ref 5–15)
ANION GAP: 7 (ref 5–15)
Anion gap: 6 (ref 5–15)
BUN: 31 mg/dL — ABNORMAL HIGH (ref 6–20)
BUN: 29 mg/dL — AB (ref 6–20)
BUN: 28 mg/dL — AB (ref 6–20)
BUN: 29 mg/dL — AB (ref 6–20)
CALCIUM: 8 mg/dL — AB (ref 8.9–10.3)
CHLORIDE: 106 mmol/L (ref 101–111)
CHLORIDE: 108 mmol/L (ref 101–111)
CO2: 20 mmol/L — ABNORMAL LOW (ref 22–32)
CO2: 17 mmol/L — ABNORMAL LOW (ref 22–32)
CO2: 19 mmol/L — AB (ref 22–32)
CO2: 18 mmol/L — AB (ref 22–32)
Calcium: 8.1 mg/dL — ABNORMAL LOW (ref 8.9–10.3)
Calcium: 8.5 mg/dL — ABNORMAL LOW (ref 8.9–10.3)
Calcium: 8.5 mg/dL — ABNORMAL LOW (ref 8.9–10.3)
Chloride: 106 mmol/L (ref 101–111)
Chloride: 106 mmol/L (ref 101–111)
Creatinine, Ser: 3.35 mg/dL — ABNORMAL HIGH (ref 0.44–1.00)
Creatinine, Ser: 3.04 mg/dL — ABNORMAL HIGH (ref 0.44–1.00)
Creatinine, Ser: 2.87 mg/dL — ABNORMAL HIGH (ref 0.44–1.00)
Creatinine, Ser: 2.75 mg/dL — ABNORMAL HIGH (ref 0.44–1.00)
GFR calc Af Amer: 14 mL/min — ABNORMAL LOW (ref >60)
GFR calc Af Amer: 16 mL/min — ABNORMAL LOW (ref >60)
GFR calc Af Amer: 17 mL/min — ABNORMAL LOW (ref >60)
GFR calc Af Amer: 18 mL/min — ABNORMAL LOW (ref >60)
GFR calc non Af Amer: 14 mL/min — ABNORMAL LOW (ref >60)
GFR calc non Af Amer: 15 mL/min — ABNORMAL LOW (ref >60)
GFR calc non Af Amer: 16 mL/min — ABNORMAL LOW (ref >60)
GFR, EST NON AFRICAN AMERICAN: 12 mL/min — AB (ref >60)
GLUCOSE: 164 mg/dL — AB (ref 65–99)
GLUCOSE: 132 mg/dL — AB (ref 65–99)
GLUCOSE: 120 mg/dL — AB (ref 65–99)
GLUCOSE: 128 mg/dL — AB (ref 65–99)
POTASSIUM: 4.1 mmol/L (ref 3.5–5.1)
POTASSIUM: 4.4 mmol/L (ref 3.5–5.1)
POTASSIUM: 4.1 mmol/L (ref 3.5–5.1)
Potassium: 4.2 mmol/L (ref 3.5–5.1)
SODIUM: 129 mmol/L — AB (ref 135–145)
SODIUM: 129 mmol/L — AB (ref 135–145)
Sodium: 131 mmol/L — ABNORMAL LOW (ref 135–145)
Sodium: 133 mmol/L — ABNORMAL LOW (ref 135–145)

## 2016-04-19 LAB — CBC
HEMATOCRIT: 27 % — AB (ref 35.0–47.0)
HEMOGLOBIN: 9 g/dL — AB (ref 12.0–16.0)
MCH: 27.3 pg (ref 26.0–34.0)
MCHC: 33.4 g/dL (ref 32.0–36.0)
MCV: 81.7 fL (ref 80.0–100.0)
Platelets: 147 10*3/uL — ABNORMAL LOW (ref 150–440)
RBC: 3.3 MIL/uL — ABNORMAL LOW (ref 3.80–5.20)
RDW: 17.4 % — ABNORMAL HIGH (ref 11.5–14.5)
WBC: 6.7 10*3/uL (ref 3.6–11.0)

## 2016-04-19 LAB — GLUCOSE, CAPILLARY
GLUCOSE-CAPILLARY: 156 mg/dL — AB (ref 65–99)
GLUCOSE-CAPILLARY: 107 mg/dL — AB (ref 65–99)
GLUCOSE-CAPILLARY: 108 mg/dL — AB (ref 65–99)
Glucose-Capillary: 121 mg/dL — ABNORMAL HIGH (ref 65–99)

## 2016-04-19 LAB — OCCULT BLOOD X 1 CARD TO LAB, STOOL: Fecal Occult Bld: NEGATIVE

## 2016-04-19 LAB — MAGNESIUM: Magnesium: 1.6 mg/dL — ABNORMAL LOW (ref 1.7–2.4)

## 2016-04-19 MED ORDER — SODIUM CHLORIDE 0.9 % IV SOLN
INTRAVENOUS | Status: DC
  Administered 2016-04-19 (×2): via INTRAVENOUS

## 2016-04-19 NOTE — Progress Notes (Signed)
[facility]  ROUNDING NOTE   Subjective:  Patient doing much better. Creatinine down to 2.87. Excellent urine output noted. Serum sodium also up to 131. Hemoglobin also up to 9.0.   Objective:  Vital signs in last 24 hours:  Temp:  [97.9 F (36.6 C)-98.1 F (36.7 C)] 98.1 F (36.7 C) (10/01 0523) Pulse Rate:  [73-110] 110 (10/01 1315) Resp:  [16-18] 16 (10/01 0523) BP: (123-128)/(50-63) 123/63 (10/01 0523) SpO2:  [98 %-99 %] 98 % (10/01 0523)  Weight change:  Filed Weights   04/17/16 0929 04/17/16 1645  Weight: 76.7 kg (169 lb) 80.6 kg (177 lb 11.1 oz)    Intake/Output: I/O last 3 completed shifts: In: 2800 [P.O.:480; I.V.:4125; Blood:625; IV Piggyback:150] Out: 3491 [Urine:6545]   Intake/Output this shift:  Total I/O In: 480 [P.O.:480] Out: 1500 [Urine:1500]  Physical Exam: General: No acute distress  Head: Normocephalic, atraumatic. Moist oral mucosal membranes  Eyes: Anicteric  Neck: Supple, trachea midline  Lungs:  Clear to auscultation, normal effort  Heart: S1S2 no rubs  Abdomen:  Soft, nontender, BS present  Extremities: no peripheral edema.  Neurologic: Nonfocal, moving all four extremities  Skin: No lesions       Basic Metabolic Panel:  Recent Labs Lab 04/17/16 0841 04/17/16 2035  04/18/16 1135 04/18/16 2011 04/19/16 0214 04/19/16 0509 04/19/16 1302  NA 130* 132*  < > 129* 128* 129* 129* 131*  K 4.2 4.1  < > 4.4 4.4 4.2 4.1 4.4  CL 104 109  < > 106 105 106 106 106  CO2 14* 16*  < > 17* 15* 20* 17* 19*  GLUCOSE 89 76  < > 118* 158* 164* 132* 120*  BUN 46* 40*  < > 35* 32* 31* 29* 28*  CREATININE 6.42* 5.21*  < > 4.13* 3.57* 3.35* 3.04* 2.87*  CALCIUM 8.4* 7.4*  < > 7.7* 7.9* 8.0* 8.1* 8.5*  MG 1.5* 1.6*  --   --   --   --  1.6*  --   PHOS  --  4.7*  --   --   --   --   --   --   < > = values in this interval not displayed.  Liver Function Tests:  Recent Labs Lab 04/17/16 0841  AST 19  ALT 9*  ALKPHOS 60  BILITOT  0.5  PROT 7.9  ALBUMIN 2.5*   No results for input(s): LIPASE, AMYLASE in the last 168 hours. No results for input(s): AMMONIA in the last 168 hours.  CBC:  Recent Labs Lab 04/17/16 0841 04/17/16 2035 04/17/16 2336 04/18/16 0613 04/19/16 0509  WBC 7.3 7.5 7.9 10.0 6.7  NEUTROABS 6.8*  --   --   --   --   HGB 7.2* 6.1* 6.5* 8.2* 9.0*  HCT 22.3* 19.5* 20.7* 25.1* 27.0*  MCV 83.0 83.8 83.9 83.6 81.7  PLT 137* 123* 132* 124* 147*    Cardiac Enzymes:  Recent Labs Lab 04/17/16 0841  TROPONINI 0.03*    BNP: Invalid input(s): POCBNP  CBG:  Recent Labs Lab 04/18/16 1334 04/18/16 1640 04/18/16 2125 04/19/16 0743 04/19/16 1159  GLUCAP 135* 121* 151* 156* 121*    Microbiology: Results for orders placed or performed during the hospital encounter of 04/17/16  Urine culture     Status: None   Collection Time: 04/17/16  8:41 AM  Result Value Ref Range Status   Specimen Description URINE, CLEAN CATCH  Final   Special Requests NONE  Final   Culture  NO GROWTH Performed at [facility]   Final   Report Status 04/18/2016 FINAL  Final  Blood Culture (routine x 2)     Status: None (Preliminary result)   Collection Time: 04/17/16  8:42 AM  Result Value Ref Range Status   Specimen Description PORTA CATH  Final   Special Requests AER 10 CC ANA 10CC  Final   Culture NO GROWTH 2 DAYS  Final   Report Status PENDING  Incomplete  Blood Culture (routine x 2)     Status: None (Preliminary result)   Collection Time: 04/17/16  8:42 AM  Result Value Ref Range Status   Specimen Description BLOOD RIGHT ARM  Final   Special Requests AER 10CC ANA 10CC  Final   Culture NO GROWTH 2 DAYS  Final   Report Status PENDING  Incomplete    Coagulation Studies: No results for input(s): LABPROT, INR in the last 72 hours.  Urinalysis:  Recent Labs  04/17/16 0835  COLORURINE YELLOW*  LABSPEC 1.008  PHURINE 7.0  GLUCOSEU 150*  HGBUR 3+*  BILIRUBINUR NEGATIVE  KETONESUR  NEGATIVE  PROTEINUR 100*  NITRITE NEGATIVE  LEUKOCYTESUR 3+*      Imaging: No results found.   Medications:   . sodium chloride 75 mL/hr at 04/19/16 0807   . atorvastatin  40 mg Oral q1800  . cefTRIAXone (ROCEPHIN)  IV  1 g Intravenous Q24H  . dronabinol  2.5 mg Oral BID AC  . famotidine  20 mg Oral Daily  . ferrous sulfate  325 mg Oral BID WC  . folic acid  1 mg Oral Daily  . gabapentin  100 mg Oral BID  . heparin  5,000 Units Subcutaneous Q8H  . insulin aspart  0-5 Units Subcutaneous QHS  . insulin aspart  0-9 Units Subcutaneous TID WC  . mirabegron ER  25 mg Oral Daily  . sodium chloride  1,000 mL Intravenous Once   albuterol, bisacodyl, HYDROcodone-acetaminophen, LORazepam, ondansetron **OR** ondansetron (ZOFRAN) IV  Assessment/ Plan:  75 y.o. female with long-standing hypertension, diabetes type 2, hyperlipidemia, osteoarthritis, obstructive sleep apnea, bladder cancer 8/17 s/p TURBT and radiation therapy/chemo with gemcitabine.  1. Acute renal failure/chronic kidney disease stage IV.  Baseline creatinine 2.3 EGFR 23. Acute renal failure now likely related to poor by mouth intake status post chemotherapy treatment with gemcitabine.  -  Kidney function continues to improve. Creatinine down to 2.87 however still above her baseline. Continue to monitor renal function daily. Continue normal saline for 1 additional day.  2. Anemia of chronic kidney disease. Hemoglobin up to 9.0 posttransfusion. Continue to monitor.  3. urinary tract infection. Too numerous to count RBCs and WBCs noted in the urine. Continue ceftriaxone.  4. Hyponatremia. Serum sodium currently 131. Continue 0.9 normal saline as above. Follow-up serum sodium tomorrow.    LOS: 2 Barbara [name] 10/1/20174:16 PM

## 2016-04-19 NOTE — Progress Notes (Signed)
[facility] [place] NAME: Barbara [name]    MRN#:  [id number]  DATE OF BIRTH:  04-21-1941  SUBJECTIVE:  Hospital Day: 2 days Barbara [name] is a 75 y.o. female presenting with Seizures and Hypoglycemia .   Overnight events: Transferred out of ICU yesterday  Interval Events: No complaints this morning  REVIEW OF SYSTEMS:  CONSTITUTIONAL: No fever, fatigue or weakness.  EYES: No blurred or double vision.  EARS, NOSE, AND THROAT: No tinnitus or ear pain.  RESPIRATORY: No cough, shortness of breath, wheezing or hemoptysis.  CARDIOVASCULAR: No chest pain, orthopnea, edema.  GASTROINTESTINAL: No nausea, vomiting, diarrhea or abdominal pain.  GENITOURINARY: No dysuria, hematuria.  ENDOCRINE: No polyuria, nocturia,  HEMATOLOGY: No anemia, easy bruising or bleeding SKIN: No rash or lesion. MUSCULOSKELETAL: No joint pain or arthritis.   NEUROLOGIC: No tingling, numbness, weakness.  PSYCHIATRY: No anxiety or depression.   DRUG ALLERGIES:   Allergies  Allergen Reactions  . Percocet [Oxycodone-Acetaminophen] Hives    VITALS:  Blood pressure 123/63, pulse 73, temperature 98.1 F (36.7 C), temperature source Oral, resp. rate 16, height 5' 8"  (1.727 m), weight 80.6 kg (177 lb 11.1 oz), SpO2 98 %.  PHYSICAL EXAMINATION:  VITAL SIGNS: Vitals:   04/18/16 2037 04/19/16 0523  BP: (!) 128/50 123/63  Pulse: 76 73  Resp: 18 16  Temp: 97.9 F (36.6 C) 98.1 F (36.7 C)   GENERAL:75 y.o.female currently in no acute distress.  HEAD: Normocephalic, atraumatic.  EYES: Pupils equal, round, reactive to light. Extraocular muscles intact. No scleral icterus.  MOUTH: Moist mucosal membrane. Dentition intact. No abscess noted.  EAR, NOSE, THROAT: Clear without exudates. No external lesions.  NECK: Supple. No thyromegaly. No nodules. No JVD.  PULMONARY: Clear to ascultation, without wheeze rails or rhonci. No use of accessory muscles, Good respiratory  effort. good air entry bilaterally CHEST: Nontender to palpation.  CARDIOVASCULAR: S1 and S2. Regular rate and rhythm. No murmurs, rubs, or gallops. No edema. Pedal pulses 2+ bilaterally.  GASTROINTESTINAL: Soft, nontender, nondistended. No masses. Positive bowel sounds. No hepatosplenomegaly.  MUSCULOSKELETAL: No swelling, clubbing, or edema. Range of motion full in all extremities.  NEUROLOGIC: Cranial nerves II through XII are intact. No gross focal neurological deficits. Sensation intact. Reflexes intact.  SKIN: No ulceration, lesions, rashes, or cyanosis. Skin warm and dry. Turgor intact.  PSYCHIATRIC: Mood, affect flat. The patient is awake, alert and oriented x 3. Insight, judgment intact.      LABORATORY PANEL:   CBC  Recent Labs Lab 04/19/16 0509  WBC 6.7  HGB 9.0*  HCT 27.0*  PLT 147*   ------------------------------------------------------------------------------------------------------------------  Chemistries   Recent Labs Lab 04/17/16 0841  04/19/16 0509  NA 130*  < > 129*  K 4.2  < > 4.1  CL 104  < > 106  CO2 14*  < > 17*  GLUCOSE 89  < > 132*  BUN 46*  < > 29*  CREATININE 6.42*  < > 3.04*  CALCIUM 8.4*  < > 8.1*  MG 1.5*  < > 1.6*  AST 19  --   --   ALT 9*  --   --   ALKPHOS 60  --   --   BILITOT 0.5  --   --   < > = values in this interval not displayed. ------------------------------------------------------------------------------------------------------------------  Cardiac Enzymes  Recent Labs Lab 04/17/16 0841  TROPONINI 0.03*   ------------------------------------------------------------------------------------------------------------------  RADIOLOGY:  No results found.  EKG:  Orders placed or performed during the hospital encounter of 04/17/16  . ED EKG  . ED EKG  . EKG    ASSESSMENT AND PLAN:   Barbara [name] is a 75 y.o. female presenting with Seizures and Hypoglycemia . Admitted 04/17/2016 : Day #: 2 days 1. Hypoglycemia  type 2 diabetes:Discontinue D10 infusion, followed glucose  2. Acute renal failure: Improving, Appreciate nephrology input likely secondary to acute tubular necrosis 3. Seizure: In the setting of hypoglycemia 4. Urinary tract infection: Culture data negative thus far, ceftriaxone day #2/3 5. Bladder cancer: Oncology appreciated  Disposition: Physical therapy evaluation  All the records are reviewed and case discussed with Care Management/Social Workerr. Management plans discussed with the patient, family and they are in agreement.  CODE STATUS: full TOTAL TIME TAKING CARE OF THIS PATIENT: 28 minutes.   POSSIBLE D/C IN 1-2DAYS, DEPENDING ON CLINICAL CONDITION.   Hower,  Barbara [name].D on 04/19/2016 at 11:04 AM  Between 7am to 6pm - Pager - [phone]  After 6pm: House Pager: - [phone]  [facility]  Office  [phone]  CC: Primary care physician; Barbara Brunner, MD

## 2016-04-19 NOTE — Evaluation (Signed)
Physical Therapy Evaluation Patient Details Name: Barbara [name] MRN: [id number] DOB: 1940-12-05 Today's Date: 04/19/2016   History of Present Illness  Barbara [name] is a 75yo black female who comes to [facility] on 9/29 after being found at home confused. EMS noted low BG and administered glucose. In ED pt noted to be hypoglycemic, hypotensive, and hypothermic and witnessed to have sustained a seizure.  Pt also anemic and given 2 units PRBC. Pt's husband reports she was at [facility] for about 30 days until about 2 weeks ago. 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   Clinical Impression  Upon entry, the patient is received semirecumbent in bed, husband present. The pt is awake and agreeable to participate. No acute distress noted at this time, although she is intermittently bothered by urinary tract pain from catheter with mobility. VSS during eval. The pt is alert and oriented x3, pleasant, conversational, and following simple commands 75% of time but is very distractible with divided attention. Functional mobility assessment demonstrates mild-moderate weakness, the pt now requiring near maximal effort and pacing strategies for bed mobility, transfers, and gait, whereas the patient performs these with modified independence at baseline. The patient is at high risk for falls as evidence by gait speed <1.72ms, forward reach <5", and multiple unsafe behaviors demonstrated with RW throughout session. Patient presenting with impairment of strength, range of motion, balance, and activity tolerance, limiting ability to perform ADL and mobility tasks at  baseline level of function. Patient will benefit from skilled intervention to address the above impairments and limitations, in order to restore to prior level of function, improve patient safety upon discharge, and to decrease falls risk.       Follow Up Recommendations Home health PT    Equipment Recommendations  None recommended  by PT    Recommendations for Other Services       Precautions / Restrictions Precautions Precautions: Fall Precaution Comments: Seizure precautions.  Restrictions Weight Bearing Restrictions: No      Mobility  Bed Mobility Overal bed mobility: Needs Assistance Bed Mobility: Supine to Sit     Supine to sit: Supervision     General bed mobility comments: very weak, requires several minutes due to pain and weakness, general feelins fo unease. PVCs noted on tele during transistion.   Transfers Overall transfer level: Modified independent Equipment used: Rolling walker (2 wheeled) Transfers: Sit to/from Stand Sit to Stand: Supervision         General transfer comment: easily distracted, unsafe use of RW, no noted LOB.   Ambulation/Gait Ambulation/Gait assistance: Min guard Ambulation Distance (Feet): 100 Feet Assistive device: Rolling walker (2 wheeled)     Gait velocity interpretation: <1.8 ft/sec, indicative of risk for recurrent falls General Gait Details: slow, asynchronous, husband reports it to be near baseline.   Stairs            Wheelchair Mobility    Modified Rankin (Stroke Patients Only)       Balance Overall balance assessment: No apparent balance deficits (not formally assessed)                                           Pertinent Vitals/Pain Pain Assessment: No/denies pain (Later grimacing with urinary tract pain (burning) )    Home Living Family/patient expects to be discharged to:: Private residence Living Arrangements: Spouse/significant  other Available Help at Discharge: Family;Available PRN/intermittently Type of Home: House Home Access: Stairs to enter Entrance Stairs-Rails: None Entrance Stairs-Number of Steps: 1 Home Layout: One level Home Equipment: Walker - 2 wheels;Cane - single point;Toilet riser;Wheelchair - manual Additional Comments: Pt's husband reports he is always home to assist pt.    Prior  Function Level of Independence: Independent with assistive device(s)   Gait / Transfers Assistance Needed: Pt reports she ambulates at home with Barbara [name] and 2WRW. Pt ambulates household distances only (room to room)  ADL's / Homemaking Assistance Needed: Pt able to dress and bathe herself independently but requires assistance from husband for preparing meals.        Hand Dominance        Extremity/Trunk Assessment   Upper Extremity Assessment: Generalized weakness;Overall [name] for tasks assessed           Lower Extremity Assessment: Generalized weakness;Overall WFL for tasks assessed         Communication   Communication: No difficulties  Cognition                            General Comments      Exercises     Assessment/Plan    PT Assessment Patient needs continued PT services  PT Problem List Decreased strength;Decreased activity tolerance;Decreased balance;Decreased mobility;Decreased coordination;Pain;Decreased safety awareness;Obesity          PT Treatment Interventions Gait training;Functional mobility training;Stair training;Therapeutic activities;Therapeutic exercise;Balance training    PT Goals (Current goals can be found in the Care Plan section)  Acute Rehab PT Goals Patient Stated Goal: Improve safety and ease of AMB in home.  PT Goal Formulation: With patient Time For Goal Achievement: 05/03/16 Potential to Achieve Goals: Fair    Frequency Min 2X/week   Barriers to discharge Inaccessible home environment      Co-evaluation               End of Session Equipment Utilized During Treatment: Gait belt Activity Tolerance: Patient tolerated treatment well;Patient limited by fatigue;No increased pain Patient left: in bed;with nursing/sitter in room;with family/visitor present Nurse Communication: Other (comment);Mobility status (tele alarms going off, needed tele battery)         Time: 1235-1301 PT Time Calculation (min)  (ACUTE ONLY): 26 min   Charges:   PT Evaluation $PT Eval Moderate Complexity: 1 Procedure PT Treatments $Therapeutic Activity: 8-22 mins   PT G Codes:        1:32 PM, 05-12-16 Etta Grandchild, PT, DPT Physical Therapist - [place] [phone] (mobile)

## 2016-04-20 ENCOUNTER — Ambulatory Visit: Admission: RE | Admit: 2016-04-20 | Payer: Medicare Other | Source: Ambulatory Visit

## 2016-04-20 LAB — TYPE AND SCREEN
ABO/RH(D): O POS
ANTIBODY SCREEN: NEGATIVE
UNIT DIVISION: 0
Unit division: 0

## 2016-04-20 LAB — GLUCOSE, CAPILLARY
GLUCOSE-CAPILLARY: 102 mg/dL — AB (ref 65–99)
GLUCOSE-CAPILLARY: 106 mg/dL — AB (ref 65–99)
Glucose-Capillary: 118 mg/dL — ABNORMAL HIGH (ref 65–99)

## 2016-04-20 MED ORDER — HEPARIN SOD (PORK) LOCK FLUSH 100 UNIT/ML IV SOLN
500.0000 [IU] | Freq: Once | INTRAVENOUS | Status: AC
Start: 2016-04-20 — End: 2016-04-20
  Administered 2016-04-20: 16:00:00 500 [IU] via INTRAVENOUS
  Filled 2016-04-20: qty 5

## 2016-04-20 NOTE — Progress Notes (Signed)
[facility]  ROUNDING NOTE   Subjective:   Laying in bed.  Creatinine 2.75 (2.87) UOP 1500   Objective:  Vital signs in last 24 hours:  Temp:  [98.2 F (36.8 C)] 98.2 F (36.8 C) (10/02 0524) Pulse Rate:  [63-110] 63 (10/02 0524) Resp:  [18-22] 22 (10/02 0524) BP: (124-127)/(57-64) 124/64 (10/02 0524) SpO2:  [97 %-100 %] 97 % (10/02 0524)  Weight change:  Filed Weights   04/17/16 0929 04/17/16 1645  Weight: 76.7 kg (169 lb) 80.6 kg (177 lb 11.1 oz)    Intake/Output: I/O last 3 completed shifts: In: 3250.4 [P.O.:480; I.V.:2670.4; IV Piggyback:100] Out: 5 [Urine:3800; Stool:1]   Intake/Output this shift:  Total I/O In: 240 [P.O.:240] Out: 1 [Urine:1]  Physical Exam: General: No acute distress  Head: Normocephalic, atraumatic. Moist oral mucosal membranes  Eyes: Anicteric  Neck: Supple, trachea midline  Lungs:  Clear to auscultation, normal effort  Heart: S1S2 no rubs  Abdomen:  Soft, nontender, BS present  Extremities: no peripheral edema.  Neurologic: Nonfocal, moving all four extremities  Skin: No lesions       Basic Metabolic Panel:  Recent Labs Lab 04/17/16 0841 04/17/16 2035  04/18/16 2011 04/19/16 0214 04/19/16 0509 04/19/16 1302 04/19/16 1602  NA 130* 132*  < > 128* 129* 129* 131* 133*  K 4.2 4.1  < > 4.4 4.2 4.1 4.4 4.1  CL 104 109  < > 105 106 106 106 108  CO2 14* 16*  < > 15* 20* 17* 19* 18*  GLUCOSE 89 76  < > 158* 164* 132* 120* 128*  BUN 46* 40*  < > 32* 31* 29* 28* 29*  CREATININE 6.42* 5.21*  < > 3.57* 3.35* 3.04* 2.87* 2.75*  CALCIUM 8.4* 7.4*  < > 7.9* 8.0* 8.1* 8.5* 8.5*  MG 1.5* 1.6*  --   --   --  1.6*  --   --   PHOS  --  4.7*  --   --   --   --   --   --   < > = values in this interval not displayed.  Liver Function Tests:  Recent Labs Lab 04/17/16 0841  AST 19  ALT 9*  ALKPHOS 60  BILITOT 0.5  PROT 7.9  ALBUMIN 2.5*   No results for input(s): LIPASE, AMYLASE in the last 168 hours. No results  for input(s): AMMONIA in the last 168 hours.  CBC:  Recent Labs Lab 04/17/16 0841 04/17/16 2035 04/17/16 2336 04/18/16 0613 04/19/16 0509  WBC 7.3 7.5 7.9 10.0 6.7  NEUTROABS 6.8*  --   --   --   --   HGB 7.2* 6.1* 6.5* 8.2* 9.0*  HCT 22.3* 19.5* 20.7* 25.1* 27.0*  MCV 83.0 83.8 83.9 83.6 81.7  PLT 137* 123* 132* 124* 147*    Cardiac Enzymes:  Recent Labs Lab 04/17/16 0841  TROPONINI 0.03*    BNP: Invalid input(s): POCBNP  CBG:  Recent Labs Lab 04/19/16 0743 04/19/16 1159 04/19/16 1637 04/19/16 2059 04/20/16 0729  GLUCAP 156* 121* 107* 108* 102*    Microbiology: Results for orders placed or performed during the hospital encounter of 04/17/16  Urine culture     Status: None   Collection Time: 04/17/16  8:41 AM  Result Value Ref Range Status   Specimen Description URINE, CLEAN CATCH  Final   Special Requests NONE  Final   Culture NO GROWTH Performed at [facility]   Final   Report Status 04/18/2016 FINAL  Final  Blood Culture (routine x 2)     Status: None (Preliminary result)   Collection Time: 04/17/16  8:42 AM  Result Value Ref Range Status   Specimen Description PORTA CATH  Final   Special Requests AER 10 CC ANA 10CC  Final   Culture NO GROWTH 3 DAYS  Final   Report Status PENDING  Incomplete  Blood Culture (routine x 2)     Status: None (Preliminary result)   Collection Time: 04/17/16  8:42 AM  Result Value Ref Range Status   Specimen Description BLOOD RIGHT ARM  Final   Special Requests AER 10CC ANA 10CC  Final   Culture NO GROWTH 3 DAYS  Final   Report Status PENDING  Incomplete    Coagulation Studies: No results for input(s): LABPROT, INR in the last 72 hours.  Urinalysis: No results for input(s): COLORURINE, LABSPEC, PHURINE, GLUCOSEU, HGBUR, BILIRUBINUR, KETONESUR, PROTEINUR, UROBILINOGEN, NITRITE, LEUKOCYTESUR in the last 72 hours.  Invalid input(s): APPERANCEUR    Imaging: No results found.   Medications:   . sodium  chloride 75 mL/hr at 04/19/16 2106   . atorvastatin  40 mg Oral q1800  . cefTRIAXone (ROCEPHIN)  IV  1 g Intravenous Q24H  . dronabinol  2.5 mg Oral BID AC  . famotidine  20 mg Oral Daily  . ferrous sulfate  325 mg Oral BID WC  . folic acid  1 mg Oral Daily  . gabapentin  100 mg Oral BID  . heparin  5,000 Units Subcutaneous Q8H  . insulin aspart  0-5 Units Subcutaneous QHS  . insulin aspart  0-9 Units Subcutaneous TID WC  . mirabegron ER  25 mg Oral Daily  . sodium chloride  1,000 mL Intravenous Once   albuterol, bisacodyl, HYDROcodone-acetaminophen, LORazepam, ondansetron **OR** ondansetron (ZOFRAN) IV  Assessment/ Plan:  75 y.o.black female with hypertension, diabetesmellitus type 2, hyperlipidemia, osteoarthritis, obstructive sleep apnea, bladder cancer 8/17 s/p TURBT and radiation therapy/chemo with gemcitabine. Admitted on 04/17/2016 for [facility]iz[facility]) [R56.9] Sepsis, due to unspecified organism ([place]) [A41.9]  1. Acute renal failure on chronic kidney disease stage IV.  Baseline creatinine 2.3 EGFR 23. Acute renal failure now likely related to poor by mouth intake status post chemotherapy treatment with gemcitabine.  -  Kidney function continues to improve.  - no acute indication for dialysis.  - discontinue IV saline  2. Anemia of chronic kidney disease: status post transfusion.  3. Urinary tract infection.  -  Continue ceftriaxone.  4. Hyponatremia: sodium stable and at baseline.     LOS: [place], Vyncent Overby 10/2/201711:07 AM

## 2016-04-20 NOTE — Progress Notes (Signed)
Thank you for consulting the Palliative Medicine Team at [facility] to meet your patient's and family's needs.   The reason that you asked [place] to see your patient is  For Establishing GOC  I left message for husband to call back to schedule a GOC meeting.  Await callback.   Wadie Lessen NP  Palliative Medicine Team Team Phone # [phone] Pager [phone]

## 2016-04-20 NOTE — Progress Notes (Signed)
Patient discharged home per MD order. All discharge instructions given and all questions answered. 

## 2016-04-20 NOTE — Care Management Important Message (Signed)
Important Message  Patient Details  Name: Barbara [name] MRN: [id number] Date of Birth: [birth year]   Medicare Important Message Given:  Yes    Shelbie Ammons, RN 04/20/2016, 8:35 AM

## 2016-04-20 NOTE — Progress Notes (Signed)
PT Cancellation Note  Patient Details Name: Barbara [name] MRN: [id number] DOB: 09/04/1940   Cancelled Treatment:     Treatment attempted with patient refusing x 2 stating "I just don't feel well, nothing today."  Pt would not clarify what didn't feel well and declined offer to bring nursing in to discuss with patient.  Nursing informed of patient's comments.    Linus Salmons PT, DPT 04/20/16, 3:52 PM

## 2016-04-20 NOTE — Care Management (Signed)
Physical therapy evaluation completed. Recommended home with home health and physical therapy. Discussed agencies with Ms. Wohlers at the bedside. Agreed to have home services 1-2 times in the home. Chose [facility]. Referral given to Flo Shanks, RN representative for [facility]. Husband will transport. Shelbie Ammons RN MSN CCM Care Management [phone]

## 2016-04-21 ENCOUNTER — Ambulatory Visit
Admission: RE | Admit: 2016-04-21 | Discharge: 2016-04-21 | Disposition: A | Discharge status: Home / Self Care | Payer: Medicare Other | Source: Ambulatory Visit | Attending: Radiation Oncology | Admitting: Radiation Oncology

## 2016-04-21 DIAGNOSIS — N189 Chronic kidney disease, unspecified: Secondary | ICD-10-CM | POA: Diagnosis not present

## 2016-04-21 DIAGNOSIS — Z7982 Long term (current) use of aspirin: Secondary | ICD-10-CM | POA: Diagnosis not present

## 2016-04-21 DIAGNOSIS — R197 Diarrhea, unspecified: Secondary | ICD-10-CM | POA: Diagnosis not present

## 2016-04-21 DIAGNOSIS — Z510 Encounter for antineoplastic radiation therapy: Secondary | ICD-10-CM | POA: Diagnosis not present

## 2016-04-21 DIAGNOSIS — Z809 Family history of malignant neoplasm, unspecified: Secondary | ICD-10-CM | POA: Diagnosis not present

## 2016-04-21 DIAGNOSIS — I129 Hypertensive chronic kidney disease with stage 1 through stage 4 chronic kidney disease, or unspecified chronic kidney disease: Secondary | ICD-10-CM | POA: Diagnosis not present

## 2016-04-21 DIAGNOSIS — J449 Chronic obstructive pulmonary disease, unspecified: Secondary | ICD-10-CM | POA: Diagnosis not present

## 2016-04-21 DIAGNOSIS — M129 Arthropathy, unspecified: Secondary | ICD-10-CM | POA: Diagnosis not present

## 2016-04-21 DIAGNOSIS — E119 Type 2 diabetes mellitus without complications: Secondary | ICD-10-CM | POA: Diagnosis not present

## 2016-04-21 DIAGNOSIS — Z79899 Other long term (current) drug therapy: Secondary | ICD-10-CM | POA: Diagnosis not present

## 2016-04-21 DIAGNOSIS — Z794 Long term (current) use of insulin: Secondary | ICD-10-CM | POA: Diagnosis not present

## 2016-04-21 DIAGNOSIS — C670 Malignant neoplasm of trigone of bladder: Secondary | ICD-10-CM | POA: Diagnosis present

## 2016-04-22 ENCOUNTER — Ambulatory Visit
Admission: RE | Admit: 2016-04-22 | Discharge: 2016-04-22 | Disposition: A | Discharge status: Home / Self Care | Payer: Medicare Other | Source: Ambulatory Visit | Attending: Radiation Oncology | Admitting: Radiation Oncology

## 2016-04-22 DIAGNOSIS — C670 Malignant neoplasm of trigone of bladder: Secondary | ICD-10-CM | POA: Diagnosis not present

## 2016-04-22 LAB — CULTURE, BLOOD (ROUTINE X 2)
Culture: NO GROWTH
Culture: NO GROWTH

## 2016-04-22 NOTE — Discharge Summary (Signed)
[facility] [place] NAME: Barbara [name]    MR#:  [id number]  DATE OF BIRTH:  1941-03-27  DATE OF ADMISSION:  04/17/2016 ADMITTING PHYSICIAN: Demetrios Loll, MD  DATE OF DISCHARGE: 04/20/2016  5:15 PM  PRIMARY CARE PHYSICIAN: Madelyn Brunner, MD    ADMISSION DIAGNOSIS:  Seizure ([place]) [R56.9] Sepsis, due to unspecified organism ([place]) [A41.9]  DISCHARGE DIAGNOSIS:  Active Problems:   Seizure ([place])   Hypoglycemia    Acute renal failure.  SECONDARY DIAGNOSIS:   Past Medical History:  Diagnosis Date  . Anemia associated with chronic renal failure   . Arthritis   . Chronic kidney disease   . CKD (chronic kidney disease)   . COPD (chronic obstructive pulmonary disease) ([place])   . Diabetes mellitus without complication ([place])   . Hypertension   . Sleep apnea    uses cpap  . Urinary obstruction 01/2016    HOSPITAL COURSE:   Barbara [name] is a 75 y.o. female presenting with Seizures and Hypoglycemia . Admitted 04/17/2016  1. Hypoglycemia type 2 diabetes:Initially given D10 infusion, followed glucose , stable.    Stopped insulin on discharge. 2. Acute renal failure: Improving, Appreciate nephrology input likely secondary to acute tubular necrosis    Improved. 3. Seizure: In the setting of hypoglycemia- stable. 4. Urinary tract infection: Culture data negative thus far, ceftriaxone given for 3 days in hospital. 5. Bladder cancer: Oncology consult appreciated  Seen by PT and we are discharging home with home health arrangements.  DISCHARGE CONDITIONS:   Stable.  CONSULTS OBTAINED:  Treatment Team:  Cammie Sickle, MD Anthonette Legato, MD Laverle Hobby, MD  DRUG ALLERGIES:   Allergies  Allergen Reactions  . Percocet [Oxycodone-Acetaminophen] Hives    DISCHARGE MEDICATIONS:   Discharge Medication List as of 04/20/2016  4:18 PM    CONTINUE these medications which have NOT CHANGED   Details  Albuterol Sulfate  (PROAIR RESPICLICK) 644 (90 Base) MCG/ACT AEPB Inhale 2 puffs into the lungs 4 (four) times daily as needed (wheezing). , Historical Med    atorvastatin (LIPITOR) 40 MG tablet Take 40 mg by mouth daily at 6 PM. , Historical Med    bisacodyl (DULCOLAX) 10 MG suppository Place 1 suppository (10 mg total) rectally daily as needed for moderate constipation., Starting Wed 03/11/2016, Normal    Cholecalciferol (VITAMIN D-3) 1000 units CAPS Take 1 capsule by mouth every morning., Historical Med    dronabinol (MARINOL) 2.5 MG capsule Take 1 capsule (2.5 mg total) by mouth 2 (two) times daily before lunch and supper., Starting Wed 03/11/2016, Print    famotidine (PEPCID) 20 MG tablet Take 1 tablet (20 mg total) by mouth daily., Starting Mon 01/27/2016, Normal    ferrous sulfate 325 (65 FE) MG tablet Take 1 tablet (325 mg total) by mouth 2 (two) times daily with a meal., Starting Mon 0/34/7425, Normal    folic acid (FOLVITE) 1 MG tablet Take 1 tablet (1 mg total) by mouth daily., Starting Mon 01/27/2016, Normal    gabapentin (NEURONTIN) 100 MG capsule Take 100 mg by mouth 2 (two) times daily., Historical Med    HYDROcodone-acetaminophen (NORCO/VICODIN) 5-325 MG tablet Take 1-2 tablets by mouth every 6 (six) hours as needed for moderate pain., Starting Wed 03/11/2016, Print    lidocaine-prilocaine (EMLA) cream Apply 1 application topically as needed., Starting Wed 04/08/2016, Normal    mirabegron ER (MYRBETRIQ) 25 MG TB24 tablet Take 1 tablet (25 mg total) by mouth  daily., Starting Wed 04/08/2016, Normal    NON FORMULARY Place 1 Units into the nose at bedtime. CPAP Time of use 2100-0600, Historical Med    Travoprost, BAK Free, (TRAVATAN) 0.004 % SOLN ophthalmic solution Place 1 drop into both eyes at bedtime., Historical Med      STOP taking these medications     ciprofloxacin (CIPRO) 250 MG tablet      insulin aspart (NOVOLOG) 100 UNIT/ML injection          DISCHARGE INSTRUCTIONS:    Follow  with PMD in 1-2 weeks.  If you experience worsening of your admission symptoms, develop shortness of breath, life threatening emergency, suicidal or homicidal thoughts you must seek medical attention immediately by calling 911 or calling your MD immediately  if symptoms less severe.  You Must read complete instructions/literature along with all the possible adverse reactions/side effects for all the Medicines you take and that have been prescribed to you. Take any new Medicines after you have completely understood and accept all the possible adverse reactions/side effects.   Please note  You were cared for by a hospitalist during your hospital stay. If you have any questions about your discharge medications or the care you received while you were in the hospital after you are discharged, you can call the unit and asked to speak with the hospitalist on call if the hospitalist that took care of you is not available. Once you are discharged, your primary care physician will handle any further medical issues. Please note that NO REFILLS for any discharge medications will be authorized once you are discharged, as it is imperative that you return to your primary care physician (or establish a relationship with a primary care physician if you do not have one) for your aftercare needs so that they can reassess your need for medications and monitor your lab values.    Today   CHIEF COMPLAINT:   Chief Complaint  Patient presents with  . Seizures  . Hypoglycemia    HISTORY OF PRESENT ILLNESS:  Barbara [name]  is a 75 y.o. female with a known history of Hypertension, diabetes, COPD, CKD and bladder cancer with left hydronephrosis. The patient was found confused and low sugar 40 this morning. He was treated with IM glucagon an oral glucose by EMS. According to her husband, she did not eat this morning and did not receive any insulin. She recently got chemotherapy and radiation therapy due to bladder cancer.  Patient had what appears to be a generalized seizure shortly after arrival. She had some occasional jerking while here in emergency department. Then she had a generalized seizure she was given 1 mg of IV Ativan. The patient was given IV D50 at the same time as she was having a seizure with recheck of her blood sugar at 171. Head CT did show any acute abnormality. But her creatinine increased from 2.3 to 6.42. Also she has hypothermia (95.6) and hypotension 86/42 just now.    VITAL SIGNS:  Blood pressure 139/64, pulse 75, temperature 97.5 F (36.4 C), temperature source Oral, resp. rate (!) 22, height 5' 8"  (1.727 m), weight 80.6 kg (177 lb 11.1 oz), SpO2 100 %.  I/O:  No intake or output data in the 24 hours ending 04/22/16 0815  PHYSICAL EXAMINATION:   GENERAL:75 y.o.female currently in no acute distress.  HEAD: Normocephalic, atraumatic.  EYES: Pupils equal, round, reactive to light. Extraocular muscles intact. No scleral icterus.  MOUTH: Moist mucosal membrane. Dentition intact. No abscess  noted.  EAR, NOSE, THROAT: Clear without exudates. No external lesions.  NECK: Supple. No thyromegaly. No nodules. No JVD.  PULMONARY: Clear to ascultation, without wheeze rails or rhonci. No use of accessory muscles, Good respiratory effort. good air entry bilaterally CHEST: Nontender to palpation.  CARDIOVASCULAR: S1 and S2. Regular rate and rhythm. No murmurs, rubs, or gallops. No edema. Pedal pulses 2+ bilaterally.  GASTROINTESTINAL: Soft, nontender, nondistended. No masses. Positive bowel sounds. No hepatosplenomegaly.  MUSCULOSKELETAL: No swelling, clubbing, or edema. Range of motion full in all extremities.  NEUROLOGIC: Cranial nerves II through XII are intact. No gross focal neurological deficits. Sensation intact. Reflexes intact.  SKIN: No ulceration, lesions, rashes, or cyanosis. Skin warm and dry. Turgor intact.  PSYCHIATRIC: Mood, affect flat. The patient is awake, alert and oriented x 3.  Insight, judgment intact.   DATA REVIEW:   CBC  Recent Labs Lab 04/19/16 0509  WBC 6.7  HGB 9.0*  HCT 27.0*  PLT 147*    Chemistries   Recent Labs Lab 04/17/16 0841  04/19/16 0509  04/19/16 1602  NA 130*  < > 129*  < > 133*  K 4.2  < > 4.1  < > 4.1  CL 104  < > 106  < > 108  CO2 14*  < > 17*  < > 18*  GLUCOSE 89  < > 132*  < > 128*  BUN 46*  < > 29*  < > 29*  CREATININE 6.42*  < > 3.04*  < > 2.75*  CALCIUM 8.4*  < > 8.1*  < > 8.5*  MG 1.5*  < > 1.6*  --   --   AST 19  --   --   --   --   ALT 9*  --   --   --   --   ALKPHOS 60  --   --   --   --   BILITOT 0.5  --   --   --   --   < > = values in this interval not displayed.  Cardiac Enzymes  Recent Labs Lab 04/17/16 0841  TROPONINI 0.03*    Microbiology Results  Results for orders placed or performed during the hospital encounter of 04/17/16  Urine culture     Status: None   Collection Time: 04/17/16  8:41 AM  Result Value Ref Range Status   Specimen Description URINE, CLEAN CATCH  Final   Special Requests NONE  Final   Culture NO GROWTH Performed at [facility]   Final   Report Status 04/18/2016 FINAL  Final  Blood Culture (routine x 2)     Status: None   Collection Time: 04/17/16  8:42 AM  Result Value Ref Range Status   Specimen Description PORTA CATH  Final   Special Requests AER 10 CC ANA 10CC  Final   Culture NO GROWTH 5 DAYS  Final   Report Status 04/22/2016 FINAL  Final  Blood Culture (routine x 2)     Status: None   Collection Time: 04/17/16  8:42 AM  Result Value Ref Range Status   Specimen Description BLOOD RIGHT ARM  Final   Special Requests AER 10CC ANA 10CC  Final   Culture NO GROWTH 5 DAYS  Final   Report Status 04/22/2016 FINAL  Final    RADIOLOGY:  No results found.  EKG:   Orders placed or performed during the hospital encounter of 04/17/16  . ED EKG  . ED EKG  .  EKG      Management plans discussed with the patient, family and they are in agreement.  CODE  STATUS:  Code Status History    Date Active Date Inactive Code Status Order ID Comments User Context   04/17/2016  1:36 PM 04/20/2016  8:41 PM Full Code [id number]  Demetrios Loll, MD Inpatient   03/04/2016  1:58 PM 03/10/2016  2:40 PM Full Code [id number]  Hillary Bow, MD ED   01/21/2016  9:15 PM 01/27/2016  6:01 PM Full Code [id number]  Edwin Dada, MD Inpatient   01/20/2016  6:41 PM 01/21/2016  9:15 PM Full Code [id number]  Theodoro Grist, MD Inpatient   12/18/2015  2:14 AM 12/23/2015  4:51 PM Full Code [id number]  Saundra Shelling, MD Inpatient      TOTAL TIME TAKING CARE OF THIS PATIENT: 35 minutes.    Vaughan Basta M.D on 04/22/2016 at 8:15 AM  Between 7am to 6pm - Pager - [phone]  After 6pm go to [email or web address] - password [email or web address] [facility]  Office  [phone]  CC: Primary care physician; Madelyn Brunner, MD   Note: This dictation was prepared with Dragon dictation along with smaller phrase technology. Any transcriptional errors that result from this process are unintentional.

## 2016-04-23 ENCOUNTER — Inpatient Hospital Stay: Payer: Medicare Other

## 2016-04-23 ENCOUNTER — Ambulatory Visit
Admission: RE | Admit: 2016-04-23 | Discharge: 2016-04-23 | Disposition: A | Discharge status: Home / Self Care | Payer: Medicare Other | Source: Ambulatory Visit | Attending: Radiation Oncology | Admitting: Radiation Oncology

## 2016-04-23 ENCOUNTER — Encounter: Payer: Self-pay | Admitting: Internal Medicine

## 2016-04-23 ENCOUNTER — Inpatient Hospital Stay: Payer: Medicare Other | Attending: Internal Medicine | Admitting: Internal Medicine

## 2016-04-23 DIAGNOSIS — C670 Malignant neoplasm of trigone of bladder: Secondary | ICD-10-CM | POA: Insufficient documentation

## 2016-04-23 DIAGNOSIS — Z87891 Personal history of nicotine dependence: Secondary | ICD-10-CM | POA: Diagnosis not present

## 2016-04-23 DIAGNOSIS — E1122 Type 2 diabetes mellitus with diabetic chronic kidney disease: Secondary | ICD-10-CM | POA: Diagnosis not present

## 2016-04-23 DIAGNOSIS — R300 Dysuria: Secondary | ICD-10-CM

## 2016-04-23 DIAGNOSIS — J449 Chronic obstructive pulmonary disease, unspecified: Secondary | ICD-10-CM | POA: Diagnosis not present

## 2016-04-23 DIAGNOSIS — Z5111 Encounter for antineoplastic chemotherapy: Secondary | ICD-10-CM | POA: Insufficient documentation

## 2016-04-23 DIAGNOSIS — N179 Acute kidney failure, unspecified: Secondary | ICD-10-CM

## 2016-04-23 DIAGNOSIS — D631 Anemia in chronic kidney disease: Secondary | ICD-10-CM | POA: Diagnosis not present

## 2016-04-23 DIAGNOSIS — Z79899 Other long term (current) drug therapy: Secondary | ICD-10-CM | POA: Insufficient documentation

## 2016-04-23 DIAGNOSIS — G4730 Sleep apnea, unspecified: Secondary | ICD-10-CM | POA: Diagnosis not present

## 2016-04-23 DIAGNOSIS — N184 Chronic kidney disease, stage 4 (severe): Secondary | ICD-10-CM | POA: Insufficient documentation

## 2016-04-23 DIAGNOSIS — M1990 Unspecified osteoarthritis, unspecified site: Secondary | ICD-10-CM | POA: Insufficient documentation

## 2016-04-23 DIAGNOSIS — I129 Hypertensive chronic kidney disease with stage 1 through stage 4 chronic kidney disease, or unspecified chronic kidney disease: Secondary | ICD-10-CM | POA: Insufficient documentation

## 2016-04-23 LAB — URINALYSIS COMPLETE WITH MICROSCOPIC (ARMC ONLY)
Bilirubin Urine: NEGATIVE
Glucose, UA: 50 mg/dL — AB
Ketones, ur: NEGATIVE mg/dL
Nitrite: NEGATIVE
PH: 7 (ref 5.0–8.0)
Protein, ur: 100 mg/dL — AB
SPECIFIC GRAVITY, URINE: 1.014 (ref 1.005–1.030)
Squamous Epithelial / LPF: NONE SEEN

## 2016-04-23 LAB — CBC WITH DIFFERENTIAL/PLATELET
Basophils Absolute: 0 10*3/uL (ref 0–0.1)
Basophils Relative: 0 %
Eosinophils Absolute: 0.1 10*3/uL (ref 0–0.7)
Eosinophils Relative: 1 %
HCT: 25.9 % — ABNORMAL LOW (ref 35.0–47.0)
HEMOGLOBIN: 8.7 g/dL — AB (ref 12.0–16.0)
LYMPHS PCT: 20 %
Lymphs Abs: 1.3 10*3/uL (ref 1.0–3.6)
MCH: 27.4 pg (ref 26.0–34.0)
MCHC: 33.5 g/dL (ref 32.0–36.0)
MCV: 81.8 fL (ref 80.0–100.0)
MONO ABS: 0.8 10*3/uL (ref 0.2–0.9)
MONOS PCT: 12 %
NEUTROS ABS: 4.4 10*3/uL (ref 1.4–6.5)
Neutrophils Relative %: 67 %
Platelets: 360 10*3/uL (ref 150–440)
RBC: 3.17 MIL/uL — AB (ref 3.80–5.20)
RDW: 17.5 % — ABNORMAL HIGH (ref 11.5–14.5)
WBC: 6.6 10*3/uL (ref 3.6–11.0)

## 2016-04-23 LAB — COMPREHENSIVE METABOLIC PANEL
ALBUMIN: 2.9 g/dL — AB (ref 3.5–5.0)
ALK PHOS: 66 U/L (ref 38–126)
ALT: 22 U/L (ref 14–54)
ANION GAP: 8 (ref 5–15)
AST: 34 U/L (ref 15–41)
BUN: 28 mg/dL — ABNORMAL HIGH (ref 6–20)
CO2: 18 mmol/L — AB (ref 22–32)
Calcium: 8.9 mg/dL (ref 8.9–10.3)
Chloride: 109 mmol/L (ref 101–111)
Creatinine, Ser: 2.69 mg/dL — ABNORMAL HIGH (ref 0.44–1.00)
GFR calc Af Amer: 19 mL/min — ABNORMAL LOW (ref >60)
GFR calc non Af Amer: 16 mL/min — ABNORMAL LOW (ref >60)
GLUCOSE: 114 mg/dL — AB (ref 65–99)
POTASSIUM: 4.1 mmol/L (ref 3.5–5.1)
SODIUM: 135 mmol/L (ref 135–145)
Total Bilirubin: 0.4 mg/dL (ref 0.3–1.2)
Total Protein: 8 g/dL (ref 6.5–8.1)

## 2016-04-23 MED ORDER — PROCHLORPERAZINE MALEATE 10 MG PO TABS
10.0000 mg | ORAL_TABLET | Freq: Once | ORAL | Status: AC
  Administered 2016-04-23: 10 mg via ORAL
  Filled 2016-04-23: qty 1

## 2016-04-23 MED ORDER — SODIUM CHLORIDE 0.9 % IV SOLN
1482.8897 mg | Freq: Once | INTRAVENOUS | Status: AC
  Administered 2016-04-23: 1482.8897 mg via INTRAVENOUS
  Filled 2016-04-23: qty 39

## 2016-04-23 MED ORDER — SODIUM CHLORIDE 0.9 % IV SOLN
Freq: Once | INTRAVENOUS | Status: AC
  Administered 2016-04-23: 12:00:00 via INTRAVENOUS
  Filled 2016-04-23: qty 1000

## 2016-04-23 MED ORDER — HEPARIN SOD (PORK) LOCK FLUSH 100 UNIT/ML IV SOLN
500.0000 [IU] | Freq: Once | INTRAVENOUS | Status: AC | PRN
  Administered 2016-04-23: 500 [IU]
  Filled 2016-04-23: qty 5

## 2016-04-23 NOTE — Assessment & Plan Note (Addendum)
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  On RT- and weekly gem.   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 Monitor for now  # Dysuria- likley RT cystitis; rule out UTI- check UA & culture.   # Follow-up in 2 weeks/labs chemotherapy; chemotherapy labs next week.

## 2016-04-23 NOTE — Progress Notes (Signed)
[place] OFFICE PROGRESS NOTE  Patient Care Team: Madelyn Brunner, MD as PCP - General (Internal Medicine)  No matching staging information was found for the patient.   Oncology History    #AUG 2017 TURBT- [Dr.Brandon] Large nodular proximally 5 cm bladder mass involving the left hemitrigone extending up the left lateral bladder wall, 2 discrete nodules with bridge of abnormal tissue; Tumor clinically T2. TURBT- Tumor invades muscularis propria [detrusor muscle]. NOT candidate for cystectomy.  # SEP 2017- RT- GEM weekly.  # Acute Renal failure/CKD IV / C.diff [aug 2017]     Cancer of trigone of urinary bladder ([place])   03/02/2016 Initial Diagnosis    Cancer of trigone of urinary bladder (H[facility])          INTERVAL HISTORY:  Barbara [name] 75 y.o.  female pleasant patient above history of Recently diagnosed muscle invasive bladder cancer; not a candidate for cystectomy - On definitive radiation with weekly gemcitabine is here for follow-up  Patient had again hospitalization secondary to severe hypoglycemia/worsening renal failure- currently discharged. She is currently at home.  Complains of burning pain with urination. No fevers. No new onset of back pain. No chills. No diarrhea. Denies any abdominal pain. Denies any unusual shortness of breath or cough. a.  REVIEW OF SYSTEMS:  A complete 10 point review of system is done which is negative except mentioned above/history of present illness.   PAST MEDICAL HISTORY :  Past Medical History:  Diagnosis Date  . Anemia associated with chronic renal failure   . Arthritis   . Chronic kidney disease   . CKD (chronic kidney disease)   . COPD (chronic obstructive pulmonary disease) ([place])   . Diabetes mellitus without complication ([place])   . Hypertension   . Sleep apnea    uses cpap  . Urinary obstruction 01/2016    PAST SURGICAL HISTORY :   Past Surgical History:  Procedure Laterality Date  . ABDOMINAL  HYSTERECTOMY    . CYSTOSCOPY W/ RETROGRADES Bilateral 02/17/2016   Procedure: CYSTOSCOPY WITH RETROGRADE PYELOGRAM;  Surgeon: Hollice Espy, MD;  Location: [facility];  Service: Urology;  Laterality: Bilateral;  . CYSTOSCOPY WITH STENT PLACEMENT Left 01/21/2016   Procedure: CYSTOSCOPY WITH double J STENT PLACEMENT;  Surgeon: Franchot Gallo, MD;  Location: [facility];  Service: Urology;  Laterality: Left;  . KIDNEY SURGERY  01/21/2016   IR NEPHROSTOMY PLACEMENT LEFT   . PERIPHERAL VASCULAR CATHETERIZATION N/A 04/07/2016   Procedure: Glori Luis Cath Insertion;  Surgeon: Katha Cabal, MD;  Location: [place] [facility];  Service: Cardiovascular;  Laterality: N/A;  . ROTATOR CUFF REPAIR     left  . TRANSURETHRAL RESECTION OF BLADDER TUMOR N/A 02/17/2016   Procedure: TRANSURETHRAL RESECTION OF BLADDER TUMOR (TURBT)-LARGE;  Surgeon: Hollice Espy, MD;  Location: [facility];  Service: Urology;  Laterality: N/A;  . URETEROSCOPY Left 02/17/2016   Procedure: URETEROSCOPY;  Surgeon: Hollice Espy, MD;  Location: [facility];  Service: Urology;  Laterality: Left;    FAMILY HISTORY :   Family History  Problem Relation Age of Onset  . Diabetes Mother   . Cancer Father   . Breast cancer Neg Hx   . Kidney disease Neg Hx     SOCIAL HISTORY:   Social History  Substance Use Topics  . Smoking status: Former Smoker    Quit date: 07/20/1993  . Smokeless tobacco: Never Used     Comment: 01/22/2016   "  quit smoking many years ago "  .  Alcohol use No    ALLERGIES:  is allergic to percocet [oxycodone-acetaminophen].  MEDICATIONS:  Current Outpatient Prescriptions  Medication Sig Dispense Refill  . gabapentin (NEURONTIN) 100 MG capsule Take 100 mg by mouth 2 (two) times daily.    Marland Kitchen lidocaine-prilocaine (EMLA) cream Apply 1 application topically as needed. 30 g 3  . Albuterol Sulfate (PROAIR RESPICLICK) 419 (90 Base) MCG/ACT AEPB Inhale 2 puffs into the lungs 4 (four) times daily as needed (wheezing).     Marland Kitchen  atorvastatin (LIPITOR) 40 MG tablet Take 40 mg by mouth daily at 6 PM.     . bisacodyl (DULCOLAX) 10 MG suppository Place 1 suppository (10 mg total) rectally daily as needed for moderate constipation. (Patient not taking: Reported on 04/23/2016) 12 suppository 0  . Cholecalciferol (VITAMIN D-3) 1000 units CAPS Take 1 capsule by mouth every morning.    . dronabinol (MARINOL) 2.5 MG capsule Take 1 capsule (2.5 mg total) by mouth 2 (two) times daily before lunch and supper. (Patient not taking: Reported on 04/23/2016) 60 capsule 5  . famotidine (PEPCID) 20 MG tablet Take 1 tablet (20 mg total) by mouth daily. (Patient not taking: Reported on 04/23/2016) 30 tablet 0  . ferrous sulfate 325 (65 FE) MG tablet Take 1 tablet (325 mg total) by mouth 2 (two) times daily with a meal. (Patient not taking: Reported on 04/23/2016) 60 tablet 0  . folic acid (FOLVITE) 1 MG tablet Take 1 tablet (1 mg total) by mouth daily. (Patient not taking: Reported on 04/23/2016) 30 tablet 0  . HYDROcodone-acetaminophen (NORCO/VICODIN) 5-325 MG tablet Take 1-2 tablets by mouth every 6 (six) hours as needed for moderate pain. (Patient not taking: Reported on 04/23/2016) 15 tablet 0  . mirabegron ER (MYRBETRIQ) 25 MG TB24 tablet Take 1 tablet (25 mg total) by mouth daily. (Patient not taking: Reported on 04/23/2016) 30 tablet 1  . NON FORMULARY Place 1 Units into the nose at bedtime. CPAP Time of use 2100-0600    . Travoprost, BAK Free, (TRAVATAN) 0.004 % SOLN ophthalmic solution Place 1 drop into both eyes at bedtime.     No current facility-administered medications for this visit.     PHYSICAL EXAMINATION: ECOG PERFORMANCE STATUS: 2 - Symptomatic, <50% confined to bed  There were no vitals taken for this visit.  There were no vitals filed for this visit.  GENERAL: Well-nourished well-developed; Alert, no distress and comfortable.  With her husband. She is in a wheelchair. Obese. EYES: no pallor or icterus OROPHARYNX: no thrush  or ulceration; good dentition  NECK: supple, no masses felt LYMPH:  no palpable lymphadenopathy in the cervical, axillary or inguinal regions LUNGS: clear to auscultation and  No wheeze or crackles HEART/CVS: regular rate & rhythm and no murmurs; No lower extremity edema ABDOMEN:abdomen soft, non-tender and normal bowel sounds Musculoskeletal:no cyanosis of digits and no clubbing  PSYCH: alert & oriented x 3 with fluent speech NEURO: no focal motor/sensory deficits SKIN:  no rashes or significant lesions  LABORATORY DATA:  I have reviewed the data as listed    Component Value Date/Time   NA 135 04/23/2016 0959   NA 138 08/07/2012 0433   K 4.1 04/23/2016 0959   K 3.2 (L) 10/30/2014 1410   CL 109 04/23/2016 0959   CL 104 08/07/2012 0433   CO2 18 (L) 04/23/2016 0959   CO2 25 08/07/2012 0433   GLUCOSE 114 (H) 04/23/2016 0959   GLUCOSE 172 (H) 08/07/2012 0433   BUN 28 (H) 04/23/2016  0959   BUN 63 (H) 08/07/2012 0433   CREATININE 2.69 (H) 04/23/2016 0959   CREATININE 2.48 (H) 08/07/2012 0433   CALCIUM 8.9 04/23/2016 0959   CALCIUM 9.1 08/07/2012 0433   PROT 8.0 04/23/2016 0959   PROT 8.9 (H) 08/02/2012 0958   ALBUMIN 2.9 (L) 04/23/2016 0959   ALBUMIN 3.9 08/02/2012 0958   AST 34 04/23/2016 0959   AST 21 08/02/2012 0958   ALT 22 04/23/2016 0959   ALT 19 08/02/2012 0958   ALKPHOS 66 04/23/2016 0959   ALKPHOS 107 08/02/2012 0958   BILITOT 0.4 04/23/2016 0959   BILITOT 0.3 08/02/2012 0958   GFRNONAA 16 (L) 04/23/2016 0959   GFRNONAA 19 (L) 08/07/2012 0433   GFRAA 19 (L) 04/23/2016 0959   GFRAA 22 (L) 08/07/2012 0433    No results found for: SPEP, UPEP  Lab Results  Component Value Date   WBC 6.6 04/23/2016   NEUTROABS 4.4 04/23/2016   HGB 8.7 (L) 04/23/2016   HCT 25.9 (L) 04/23/2016   MCV 81.8 04/23/2016   PLT 360 04/23/2016      Chemistry      Component Value Date/Time   NA 135 04/23/2016 0959   NA 138 08/07/2012 0433   K 4.1 04/23/2016 0959   K 3.2 (L)  10/30/2014 1410   CL 109 04/23/2016 0959   CL 104 08/07/2012 0433   CO2 18 (L) 04/23/2016 0959   CO2 25 08/07/2012 0433   BUN 28 (H) 04/23/2016 0959   BUN 63 (H) 08/07/2012 0433   CREATININE 2.69 (H) 04/23/2016 0959   CREATININE 2.48 (H) 08/07/2012 0433      Component Value Date/Time   CALCIUM 8.9 04/23/2016 0959   CALCIUM 9.1 08/07/2012 0433   ALKPHOS 66 04/23/2016 0959   ALKPHOS 107 08/02/2012 0958   AST 34 04/23/2016 0959   AST 21 08/02/2012 0958   ALT 22 04/23/2016 0959   ALT 19 08/02/2012 0958   BILITOT 0.4 04/23/2016 0959   BILITOT 0.3 08/02/2012 0958       RADIOGRAPHIC STUDIES: I have personally reviewed the radiological images as listed and agreed with the findings in the report. No results found.   ASSESSMENT & PLAN:  Cancer of trigone of urinary bladder ([place]) # Muscle invasive bladder cancer/c T2/ stage II- not a candidate for cystectomy given multiple comorbidities.  On RT- and weekly gem.   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 Monitor for now  # Dysuria- likley RT cystitis; rule out UTI- check UA & culture.   # Follow-up in 2 weeks/labs chemotherapy; chemotherapy labs next week.    Orders Placed This Encounter  Procedures  . Urine culture    Standing Status:   Future    Number of Occurrences:   1    Standing Expiration Date:   04/23/2017  . Urinalysis complete, with microscopic [facility])    Standing Status:   Future    Number of Occurrences:   1    Standing Expiration Date:   04/23/2017  . CBC with Differential    Standing Status:   Future    Standing Expiration Date:   04/23/2017  . Basic metabolic panel    Standing Status:   Future    Standing Expiration Date:   04/23/2017  . CBC with Differential    Standing Status:   Future    Standing Expiration Date:   04/23/2017  .  Comprehensive metabolic panel    Standing Status:   Future    Standing Expiration Date:    04/23/2017   All questions were answered. The patient knows to call the clinic with any problems, questions or concerns.      Cammie Sickle, MD 04/24/2016 7:34 AM

## 2016-04-23 NOTE — Progress Notes (Signed)
Pt recently hospitalized. Pt reports she is taking gabapentin only since she was dc'd from hospital and that at discharge they told her not to take any medications.  I asked pt to please look back at her discharge papers and see if it stated to dc medications.   Pt verbalizes extreme burning upon urination and frequent urination.

## 2016-04-24 ENCOUNTER — Ambulatory Visit
Admission: RE | Admit: 2016-04-24 | Discharge: 2016-04-24 | Disposition: A | Discharge status: Home / Self Care | Payer: Medicare Other | Source: Ambulatory Visit | Attending: Radiation Oncology | Admitting: Radiation Oncology

## 2016-04-24 DIAGNOSIS — C670 Malignant neoplasm of trigone of bladder: Secondary | ICD-10-CM | POA: Diagnosis not present

## 2016-04-24 LAB — URINE CULTURE: CULTURE: NO GROWTH

## 2016-04-27 ENCOUNTER — Ambulatory Visit
Admission: RE | Admit: 2016-04-27 | Discharge: 2016-04-27 | Disposition: A | Discharge status: Home / Self Care | Payer: Medicare Other | Source: Ambulatory Visit | Attending: Radiation Oncology | Admitting: Radiation Oncology

## 2016-04-27 DIAGNOSIS — C670 Malignant neoplasm of trigone of bladder: Secondary | ICD-10-CM | POA: Diagnosis not present

## 2016-04-28 ENCOUNTER — Ambulatory Visit
Admission: RE | Admit: 2016-04-28 | Discharge: 2016-04-28 | Disposition: A | Discharge status: Home / Self Care | Payer: Medicare Other | Source: Ambulatory Visit | Attending: Radiation Oncology | Admitting: Radiation Oncology

## 2016-04-28 DIAGNOSIS — C670 Malignant neoplasm of trigone of bladder: Secondary | ICD-10-CM | POA: Diagnosis not present

## 2016-04-29 ENCOUNTER — Ambulatory Visit
Admission: RE | Admit: 2016-04-29 | Discharge: 2016-04-29 | Disposition: A | Discharge status: Home / Self Care | Payer: Medicare Other | Source: Ambulatory Visit | Attending: Radiation Oncology | Admitting: Radiation Oncology

## 2016-04-29 DIAGNOSIS — C670 Malignant neoplasm of trigone of bladder: Secondary | ICD-10-CM | POA: Diagnosis not present

## 2016-04-30 ENCOUNTER — Inpatient Hospital Stay: Payer: Medicare Other

## 2016-04-30 ENCOUNTER — Ambulatory Visit: Payer: Medicare Other

## 2016-05-01 ENCOUNTER — Ambulatory Visit: Payer: Medicare Other

## 2016-05-04 ENCOUNTER — Inpatient Hospital Stay
Admission: EM | Admit: 2016-05-04 | Discharge: 2016-05-09 | DRG: 682 | Disposition: A | Discharge status: Home / Self Care | Payer: Medicare Other | Attending: Internal Medicine | Admitting: Internal Medicine

## 2016-05-04 ENCOUNTER — Ambulatory Visit: Payer: Medicare Other

## 2016-05-04 ENCOUNTER — Emergency Department: Payer: Medicare Other

## 2016-05-04 ENCOUNTER — Other Ambulatory Visit: Payer: Self-pay

## 2016-05-04 ENCOUNTER — Encounter: Payer: Self-pay | Admitting: Emergency Medicine

## 2016-05-04 DIAGNOSIS — I2510 Atherosclerotic heart disease of native coronary artery without angina pectoris: Secondary | ICD-10-CM | POA: Diagnosis present

## 2016-05-04 DIAGNOSIS — D631 Anemia in chronic kidney disease: Secondary | ICD-10-CM | POA: Diagnosis present

## 2016-05-04 DIAGNOSIS — Z87891 Personal history of nicotine dependence: Secondary | ICD-10-CM

## 2016-05-04 DIAGNOSIS — E1122 Type 2 diabetes mellitus with diabetic chronic kidney disease: Secondary | ICD-10-CM | POA: Diagnosis present

## 2016-05-04 DIAGNOSIS — R079 Chest pain, unspecified: Secondary | ICD-10-CM | POA: Diagnosis present

## 2016-05-04 DIAGNOSIS — E43 Unspecified severe protein-calorie malnutrition: Secondary | ICD-10-CM | POA: Diagnosis present

## 2016-05-04 DIAGNOSIS — E785 Hyperlipidemia, unspecified: Secondary | ICD-10-CM | POA: Diagnosis present

## 2016-05-04 DIAGNOSIS — N179 Acute kidney failure, unspecified: Secondary | ICD-10-CM | POA: Diagnosis not present

## 2016-05-04 DIAGNOSIS — Z6826 Body mass index (BMI) 26.0-26.9, adult: Secondary | ICD-10-CM | POA: Diagnosis not present

## 2016-05-04 DIAGNOSIS — N184 Chronic kidney disease, stage 4 (severe): Secondary | ICD-10-CM | POA: Diagnosis present

## 2016-05-04 DIAGNOSIS — E11649 Type 2 diabetes mellitus with hypoglycemia without coma: Secondary | ICD-10-CM | POA: Diagnosis present

## 2016-05-04 DIAGNOSIS — E875 Hyperkalemia: Secondary | ICD-10-CM | POA: Diagnosis present

## 2016-05-04 DIAGNOSIS — Z794 Long term (current) use of insulin: Secondary | ICD-10-CM | POA: Diagnosis not present

## 2016-05-04 DIAGNOSIS — R319 Hematuria, unspecified: Secondary | ICD-10-CM | POA: Diagnosis present

## 2016-05-04 DIAGNOSIS — C679 Malignant neoplasm of bladder, unspecified: Secondary | ICD-10-CM | POA: Diagnosis present

## 2016-05-04 DIAGNOSIS — N189 Chronic kidney disease, unspecified: Secondary | ICD-10-CM | POA: Diagnosis present

## 2016-05-04 DIAGNOSIS — N1330 Unspecified hydronephrosis: Secondary | ICD-10-CM | POA: Diagnosis not present

## 2016-05-04 DIAGNOSIS — E871 Hypo-osmolality and hyponatremia: Secondary | ICD-10-CM | POA: Diagnosis present

## 2016-05-04 DIAGNOSIS — I129 Hypertensive chronic kidney disease with stage 1 through stage 4 chronic kidney disease, or unspecified chronic kidney disease: Secondary | ICD-10-CM | POA: Diagnosis present

## 2016-05-04 DIAGNOSIS — G4733 Obstructive sleep apnea (adult) (pediatric): Secondary | ICD-10-CM | POA: Diagnosis present

## 2016-05-04 DIAGNOSIS — J449 Chronic obstructive pulmonary disease, unspecified: Secondary | ICD-10-CM | POA: Diagnosis present

## 2016-05-04 LAB — URINALYSIS COMPLETE WITH MICROSCOPIC (ARMC ONLY)
BILIRUBIN URINE: NEGATIVE
Bacteria, UA: NONE SEEN
GLUCOSE, UA: 50 mg/dL — AB
KETONES UR: NEGATIVE mg/dL
NITRITE: NEGATIVE
PH: 7 (ref 5.0–8.0)
Protein, ur: 100 mg/dL — AB
Specific Gravity, Urine: 1.012 (ref 1.005–1.030)
Squamous Epithelial / LPF: NONE SEEN

## 2016-05-04 LAB — SODIUM, URINE, RANDOM: SODIUM UR: 95 mmol/L

## 2016-05-04 LAB — CBC
HCT: 23.8 % — ABNORMAL LOW (ref 35.0–47.0)
Hemoglobin: 7.8 g/dL — ABNORMAL LOW (ref 12.0–16.0)
MCH: 27.2 pg (ref 26.0–34.0)
MCHC: 32.8 g/dL (ref 32.0–36.0)
MCV: 82.8 fL (ref 80.0–100.0)
PLATELETS: 151 10*3/uL (ref 150–440)
RBC: 2.87 MIL/uL — ABNORMAL LOW (ref 3.80–5.20)
RDW: 16.9 % — AB (ref 11.5–14.5)
WBC: 6.9 10*3/uL (ref 3.6–11.0)

## 2016-05-04 LAB — BASIC METABOLIC PANEL
Anion gap: 13 (ref 5–15)
BUN: 72 mg/dL — AB (ref 6–20)
CHLORIDE: 104 mmol/L (ref 101–111)
CO2: 16 mmol/L — ABNORMAL LOW (ref 22–32)
CREATININE: 6.48 mg/dL — AB (ref 0.44–1.00)
Calcium: 8.7 mg/dL — ABNORMAL LOW (ref 8.9–10.3)
GFR calc Af Amer: 6 mL/min — ABNORMAL LOW (ref >60)
GFR calc non Af Amer: 6 mL/min — ABNORMAL LOW (ref >60)
Glucose, Bld: 148 mg/dL — ABNORMAL HIGH (ref 65–99)
Potassium: 4.8 mmol/L (ref 3.5–5.1)
SODIUM: 133 mmol/L — AB (ref 135–145)

## 2016-05-04 LAB — TROPONIN I: Troponin I: <0.03 ng/mL (ref <0.03)

## 2016-05-04 MED ORDER — BISACODYL 5 MG PO TBEC
5.0000 mg | DELAYED_RELEASE_TABLET | Freq: Every day | ORAL | Status: DC | PRN
  Filled 2016-05-04: qty 1

## 2016-05-04 MED ORDER — BELLADONNA ALKALOIDS-OPIUM 16.2-60 MG RE SUPP
1.0000 | Freq: Three times a day (TID) | RECTAL | Status: DC | PRN
  Administered 2016-05-05 – 2016-05-08 (×5): 1 via RECTAL
  Filled 2016-05-04 (×5): qty 1

## 2016-05-04 MED ORDER — ONDANSETRON HCL 4 MG PO TABS: 4.0000 mg | ORAL_TABLET | Freq: Four times a day (QID) | ORAL | Status: DC | PRN

## 2016-05-04 MED ORDER — ONDANSETRON HCL 4 MG/2ML IJ SOLN: 4.0000 mg | Freq: Four times a day (QID) | INTRAMUSCULAR | Status: DC | PRN

## 2016-05-04 MED ORDER — HEPARIN SODIUM (PORCINE) 5000 UNIT/ML IJ SOLN
5000.0000 [IU] | Freq: Two times a day (BID) | INTRAMUSCULAR | Status: DC
  Administered 2016-05-05 – 2016-05-06 (×2): 5000 [IU] via SUBCUTANEOUS
  Filled 2016-05-04 (×3): qty 1

## 2016-05-04 MED ORDER — DOCUSATE SODIUM 100 MG PO CAPS
100.0000 mg | ORAL_CAPSULE | Freq: Two times a day (BID) | ORAL | Status: DC
  Administered 2016-05-04 – 2016-05-09 (×9): 100 mg via ORAL
  Filled 2016-05-04 (×10): qty 1

## 2016-05-04 MED ORDER — ALBUTEROL SULFATE (2.5 MG/3ML) 0.083% IN NEBU: 2.5000 mg | INHALATION_SOLUTION | Freq: Four times a day (QID) | RESPIRATORY_TRACT | Status: DC | PRN

## 2016-05-04 MED ORDER — DRONABINOL 2.5 MG PO CAPS
2.5000 mg | ORAL_CAPSULE | Freq: Two times a day (BID) | ORAL | Status: DC
  Administered 2016-05-04 – 2016-05-05 (×2): 2.5 mg via ORAL
  Filled 2016-05-04 (×2): qty 1

## 2016-05-04 MED ORDER — SODIUM CHLORIDE 0.9 % IV SOLN
Freq: Once | INTRAVENOUS | Status: AC
  Administered 2016-05-04: 14:00:00 via INTRAVENOUS

## 2016-05-04 MED ORDER — SODIUM CHLORIDE 0.9 % IV SOLN
INTRAVENOUS | Status: DC
  Administered 2016-05-04 – 2016-05-05 (×2): via INTRAVENOUS
  Administered 2016-05-05: 05:00:00 1000 mL via INTRAVENOUS
  Administered 2016-05-05 – 2016-05-07 (×3): via INTRAVENOUS

## 2016-05-04 MED ORDER — ATORVASTATIN CALCIUM 20 MG PO TABS
40.0000 mg | ORAL_TABLET | Freq: Every day | ORAL | Status: DC
  Administered 2016-05-04 – 2016-05-08 (×5): 40 mg via ORAL
  Filled 2016-05-04 (×5): qty 2

## 2016-05-04 MED ORDER — VITAMIN D 1000 UNITS PO TABS
1000.0000 [IU] | ORAL_TABLET | Freq: Every morning | ORAL | Status: DC
  Administered 2016-05-04 – 2016-05-09 (×6): 1000 [IU] via ORAL
  Filled 2016-05-04 (×6): qty 1

## 2016-05-04 MED ORDER — PANTOPRAZOLE SODIUM 40 MG PO TBEC
40.0000 mg | DELAYED_RELEASE_TABLET | Freq: Every day | ORAL | Status: DC
Start: 2016-05-04 — End: 2016-05-09
  Administered 2016-05-04 – 2016-05-09 (×6): 40 mg via ORAL
  Filled 2016-05-04 (×7): qty 1

## 2016-05-04 MED ORDER — ALBUTEROL SULFATE 108 (90 BASE) MCG/ACT IN AEPB: 2.0000 | INHALATION_SPRAY | Freq: Four times a day (QID) | RESPIRATORY_TRACT | Status: DC | PRN

## 2016-05-04 MED ORDER — MIRABEGRON ER 25 MG PO TB24
25.0000 mg | ORAL_TABLET | Freq: Every day | ORAL | Status: DC
  Administered 2016-05-04 – 2016-05-09 (×6): 25 mg via ORAL
  Filled 2016-05-04 (×6): qty 1

## 2016-05-04 MED ORDER — ENOXAPARIN SODIUM 40 MG/0.4ML ~~LOC~~ SOLN: 40.0000 mg | SUBCUTANEOUS | Status: DC

## 2016-05-04 MED ORDER — GABAPENTIN 300 MG PO CAPS
600.0000 mg | ORAL_CAPSULE | Freq: Three times a day (TID) | ORAL | Status: DC
  Administered 2016-05-04 – 2016-05-05 (×3): 600 mg via ORAL
  Filled 2016-05-04 (×3): qty 2

## 2016-05-04 NOTE — Progress Notes (Signed)
Anticoagulation monitoring(Lovenox):  75 yo  female ordered Lovenox 40 mg Q24h  Filed Weights   05/04/16 1108  Weight: 170 lb (77.1 kg)   BMI    Lab Results  Component Value Date   CREATININE 6.48 (H) 05/04/2016   CREATININE 2.69 (H) 04/23/2016   CREATININE 2.75 (H) 04/19/2016   Estimated Creatinine Clearance: 8.1 mL/min (by C-G formula based on SCr of 6.48 mg/dL (H)). Hemoglobin & Hematocrit     Component Value Date/Time   HGB 7.8 (L) 05/04/2016 1109   HGB 10.2 (L) 08/07/2012 0433   HCT 23.8 (L) 05/04/2016 1109   HCT 30.7 (L) 08/07/2012 8350     Per Protocol for Patient with estCrcl < 15 ml/min and BMI < 40, will transition to Heparin 5000 units SQ Q12H .

## 2016-05-04 NOTE — H&P (Signed)
[name] [place] NAME: Barbara [name]    MR#:  [id number]  DATE OF BIRTH:  Mar 08, 1941  DATE OF ADMISSION:  05/04/2016  PRIMARY CARE PHYSICIAN: Madelyn Brunner, MD   REQUESTING/REFERRING PHYSICIAN: Dr. Lenise Arena.  CHIEF COMPLAINT: Chest pain    Chief Complaint  Patient presents with  . Chest Pain    HISTORY OF PRESENT ILLNESS:  Barbara [name]  is a 75 y.o. female with a known history of  Bladder cancer on radiation therapy comes in because of chest pain. Patient has any complaints of chest pain in the midsternal region since yesterday not the associated with nausea or vomiting or sweating. Chest pain is not radiating type. No cough or fever. Patient also complaints of hematuria, constant  Urge to urinate. Patient getting radiation therapy for her bladder cancer, last radiation therapy was on 11th of October. Last admission was in September for sepsis, UTI, seizure. Patient admitted that time for hypoglycemia patient diabetic medications mainly insulin was stopped at that time. Patient also had acute renal failure improved with hydration but she also has chronic kidney disease stage IV. Thought to have a seizure because of hypoglycemia which are resolved. Follows up with Dr. Erlene Quan urology for the bladder mass.  Pt has history of invasive bladder cancer, status post TURBT, ureteral stent. Patient receiving chemotherapy, and  radiation therapy. Previously she had history of C. difficile colitis/patient has history of ureteral stent placed for the left kidney. She also had a nephrostomy tube for but that was removed. The ultrasound of the kidney showed worsening left-sided hydronephrosis, urologist was contacted by ER physician, Dr. Dayna Ramus is going to see the patient.    Past Medical History:  Diagnosis Date  . Anemia associated with chronic renal failure   . Arthritis   . Chronic kidney disease   . CKD (chronic kidney  disease)   . COPD (chronic obstructive pulmonary disease) ([place])   . Diabetes mellitus without complication ([place])   . Hypertension   . Sleep apnea    uses cpap  . Urinary obstruction 01/2016    PAST SURGICAL HISTOIRY:   Past Surgical History:  Procedure Laterality Date  . ABDOMINAL HYSTERECTOMY    . CYSTOSCOPY W/ RETROGRADES Bilateral 02/17/2016   Procedure: CYSTOSCOPY WITH RETROGRADE PYELOGRAM;  Surgeon: Hollice Espy, MD;  Location: [facility];  Service: Urology;  Laterality: Bilateral;  . CYSTOSCOPY WITH STENT PLACEMENT Left 01/21/2016   Procedure: CYSTOSCOPY WITH double J STENT PLACEMENT;  Surgeon: Franchot Gallo, MD;  Location: [facility];  Service: Urology;  Laterality: Left;  . KIDNEY SURGERY  01/21/2016   IR NEPHROSTOMY PLACEMENT LEFT   . PERIPHERAL VASCULAR CATHETERIZATION N/A 04/07/2016   Procedure: Glori Luis Cath Insertion;  Surgeon: Katha Cabal, MD;  Location: [place] [facility];  Service: Cardiovascular;  Laterality: N/A;  . ROTATOR CUFF REPAIR     left  . TRANSURETHRAL RESECTION OF BLADDER TUMOR N/A 02/17/2016   Procedure: TRANSURETHRAL RESECTION OF BLADDER TUMOR (TURBT)-LARGE;  Surgeon: Hollice Espy, MD;  Location: [facility];  Service: Urology;  Laterality: N/A;  . URETEROSCOPY Left 02/17/2016   Procedure: URETEROSCOPY;  Surgeon: Hollice Espy, MD;  Location: [facility];  Service: Urology;  Laterality: Left;    SOCIAL HISTORY:   Social History  Substance Use Topics  . Smoking status: Former Smoker    Quit date: 07/20/1993  . Smokeless tobacco: Never Used     Comment: 01/22/2016   "  quit smoking many years ago "  . Alcohol use No    FAMILY HISTORY:   Family History  Problem Relation Age of Onset  . Diabetes Mother   . Cancer Father   . Breast cancer Neg Hx   . Kidney disease Neg Hx     DRUG ALLERGIES:   Allergies  Allergen Reactions  . Percocet [Oxycodone-Acetaminophen] Hives    REVIEW OF SYSTEMS:  CONSTITUTIONAL:. Very poor appetite.  EYES: No  blurred or double vision.  EARS, NOSE, AND THROAT: No tinnitus or ear pain.  RESPIRATORY: No cough, shortness of breath, wheezing or hemoptysis.  CARDIOVASCULAR: No chest pain, orthopnea, edema.  GASTROINTESTINAL: No nausea, vomiting, diarrhea or abdominal pain.  GENITOURINARY:  Has hematuria, dysuria  ENDOCRINE: No polyuria, nocturia,  HEMATOLOGY: No anemia, easy bruising or bleeding SKIN: No rash or lesion. MUSCULOSKELETAL: No joint pain or arthritis.   NEUROLOGIC: No tingling, numbness, weakness.  PSYCHIATRY: No anxiety or depression.   MEDICATIONS AT HOME:   Prior to Admission medications   Medication Sig Start Date End Date Taking? Authorizing Provider  Albuterol Sulfate (PROAIR RESPICLICK) 409 (90 Base) MCG/ACT AEPB Inhale 2 puffs into the lungs 4 (four) times daily as needed (wheezing).    Yes Historical Provider, MD  esomeprazole (NEXIUM) 40 MG capsule Take 40 mg by mouth daily at 12 noon.   Yes Historical Provider, MD  furosemide (LASIX) 40 MG tablet Take 40 mg by mouth 2 (two) times daily.   Yes Historical Provider, MD  gabapentin (NEURONTIN) 300 MG capsule Take 600 mg by mouth 3 (three) times daily.   Yes Historical Provider, MD  insulin glargine (LANTUS) 100 unit/mL SOPN Inject into the skin at bedtime. 38 units every am & 36 units every evening   Yes Historical Provider, MD  mirabegron ER (MYRBETRIQ) 25 MG TB24 tablet Take 1 tablet (25 mg total) by mouth daily. 04/08/16  Yes Noreene Filbert, MD  atorvastatin (LIPITOR) 40 MG tablet Take 40 mg by mouth daily at 6 PM.     Historical Provider, MD  Cholecalciferol (VITAMIN D-3) 1000 units CAPS Take 1 capsule by mouth every morning.    Historical Provider, MD  dronabinol (MARINOL) 2.5 MG capsule Take 1 capsule (2.5 mg total) by mouth 2 (two) times daily before lunch and supper. Patient not taking: Reported on 05/04/2016 03/11/16   Theodoro Grist, MD  lidocaine-prilocaine (EMLA) cream Apply 1 application topically as needed. Patient not  taking: Reported on 05/04/2016 04/08/16   Cammie Sickle, MD  NON FORMULARY Place 1 Units into the nose at bedtime. CPAP Time of use 2100-0600    Historical Provider, MD      VITAL SIGNS:  Blood pressure 105/74, pulse 88, temperature 97.8 F (36.6 C), temperature source Oral, resp. rate 20, height 5' 8"  (1.727 m), weight 77.1 kg (170 lb), SpO2 100 %.  PHYSICAL EXAMINATION:  GENERAL:  75 y.o.-year-old patient lying in the bed with no acute distress. Appears ill EYES: Pupils equal, round, reactive to light and accommodation. No scleral icterus. Extraocular muscles intact.  HEENT: Head atraumatic, normocephalic. Oropharynx and nasopharynx clear.  NECK:  Supple, no jugular venous distention. No thyroid enlargement, no tenderness.  LUNGS: Normal breath sounds bilaterally, no wheezing, rales,rhonchi or crepitation. No use of accessory muscles of respiration.  CARDIOVASCULAR: S1, S2 normal. No murmurs, rubs, or gallops.  ABDOMEN: Soft, nontender, nondistended. Bowel sounds present. No organomegaly or mass. Has  suprapubic tenderness present .  EXTREMITIES: No pedal edema, cyanosis, or clubbing.  NEUROLOGIC: Cranial nerves II through XII are intact. Muscle strength 5/5 in all extremities. Sensation intact. Gait not checked.  PSYCHIATRIC: The patient is alert and oriented x 3.  SKIN: No obvious rash, lesion, or ulcer.   LABORATORY PANEL:   CBC  Recent Labs Lab 05/04/16 1109  WBC 6.9  HGB 7.8*  HCT 23.8*  PLT 151   ------------------------------------------------------------------------------------------------------------------  Chemistries   Recent Labs Lab 05/04/16 1109  NA 133*  K 4.8  CL 104  CO2 16*  GLUCOSE 148*  BUN 72*  CREATININE 6.48*  CALCIUM 8.7*   ------------------------------------------------------------------------------------------------------------------  Cardiac Enzymes  Recent Labs Lab 05/04/16 1109  TROPONINI <0.03    ------------------------------------------------------------------------------------------------------------------  RADIOLOGY:  Dg Chest 2 View  Result Date: 05/04/2016 CLINICAL DATA:  Chest pain. EXAM: CHEST  2 VIEW COMPARISON:  Radiograph of April 17, 2016. FINDINGS: The heart size and mediastinal contours are within normal limits. No pneumothorax or pleural effusion is noted. Stable position of right internal jugular Port-A-Cath. Minimal left basilar subsegmental atelectasis or scarring is noted. Right lung is clear. The visualized skeletal structures are unremarkable. IMPRESSION: Minimal left basilar subsegmental atelectasis or scarring. Electronically Signed   By: Marijo Conception, M.D.   On: 05/04/2016 12:33   US Renal  Result Date: 05/04/2016 CLINICAL DATA:  Acute renal failure.  Bladder cancer. EXAM: RENAL / URINARY TRACT ULTRASOUND COMPLETE COMPARISON:  Ultrasound dated 03/04/2016 and CT scan dated 01/20/2016 FINDINGS: Right Kidney: Length: 10.2 cm. Slight increased echogenicity of the renal parenchyma. 1 cm slightly exophytic cyst on the medial aspect of the upper pole. No hydronephrosis. Left Kidney: Length: 10.1 cm. Increased echogenicity of the renal parenchyma, unchanged. 2.2 cm complex cyst in the mid left kidney. This has diminished in size since the prior ultrasound. Recurrent moderate left hydronephrosis, new since the prior ultrasound. Stent in the renal pelvis. Bladder: Irregular thickening of the bladder wall. Ureteral stent in place on the left. Right ureteral jet identified. IMPRESSION: 1. Recurrent left hydronephrosis even though there is a left ureteral stent in place. 2. Decreased size and increased complexity of face cystic lesion in the lateral aspect of the left kidney. 3. Again noted is diffuse thickening and irregularity of the bladder wall with consistent with the history of carcinoma of the bladder. Electronically Signed   By: Lorriane Shire M.D.   On: 05/04/2016  14:06    EKG:   Orders placed or performed during the hospital encounter of 05/04/16  . ED EKG within 10 minutes  . ED EKG within 10 minutes   Sinus tachycardia with PVCs 10 1 bpm IMPRESSION AND PLAN:   #1 chest pain nonspecific secondary to renal failure. Likely due to deconditioning. troponins  are negative. I don't think she needs any further cardiac workup at this time #2 acute on chronic renal failure due to worsening left-sided hydronephrosis; patient had history of ureteral stent in the left side, supposed to have exchange of the stent by urology in December but now she has worsening kidney function with worsening left-sided hydronephrosis: Urology consult,spoke with Dr. Marland Kitchen headache, he will see the patient, start IV hydration, patient needs nephrostomy tube on the left side. Further bladder cancer continue chemotherapy, radiation as appropriate. #3 acute on chronic renal failure with chronic kidney disease stage IV: Patient had a ureteral stent on the left side, now has worsening renal failure consulted nephrology, continue IV hydration, monitor urine output.   #4 acute on chronic anemia (hemoglobin 8.7 on October 5 and 7.8 today.  Monitor closely hold off on transfusion at this time. Anorexia Full code  discuss with husband.    All the records are reviewed and case discussed with ED provider. Management plans discussed with the patient, family and they are in agreement.  CODE STATUS: full TOTAL TIME TAKING CARE OF THIS PATIENT: 55  minutes.    Epifanio Lesches M.D on 05/04/2016 at 2:59 PM  Between 7am to 6pm - Pager - [phone]  After 6pm go to [email or web address] - password [email or web address] [place] [facility]  Office  [phone]  CC: Primary care physician; Madelyn Brunner, MD  Note: This dictation was prepared with Dragon dictation along with smaller phrase technology. Any transcriptional errors that result from this process are unintentional.

## 2016-05-04 NOTE — ED Notes (Signed)
Unable to access a vein for blood draw, called Stanton Kidney RN to she if she could find anything and she was unsuccessful in finding a vein as well. Per pt husband, pt has a port. Pt informed that we do not draw from ports in triage and port will be able to be accessed once in the back. Pt fine with this.

## 2016-05-04 NOTE — ED Provider Notes (Signed)
[facility] Emergency Department Provider Note   ----------------------------------------- 12:04 PM on 05/04/2016 -----------------------------------------    I have reviewed the triage vital signs and the nursing notes.   HISTORY  Chief Complaint Chest Pain    HPI Barbara [name] is a 75 y.o. female who presents to the ER for intermittent sharp chest pain since yesterday. She denies history of same, reports dizziness and weakness associated with this chest pain. She denies shortness of breath, fevers, chills or recent illness. Patient states she is making urine like normal but it burns when she urinates.  Past Medical History:  Diagnosis Date  . Anemia associated with chronic renal failure   . Arthritis   . Chronic kidney disease   . CKD (chronic kidney disease)   . COPD (chronic obstructive pulmonary disease) ([place])   . Diabetes mellitus without complication ([place])   . Hypertension   . Sleep apnea    uses cpap  . Urinary obstruction 01/2016    Patient Active Problem List   Diagnosis Date Noted  . Seizure ([place]) 04/17/2016  . Palliative care by specialist   . DNR (do not resuscitate) discussion   . C. difficile diarrhea 03/11/2016  . Anemia 03/11/2016  . Hypotension 03/11/2016  . Bladder cancer ([place]) 03/11/2016  . Acidosis 03/11/2016  . Failure to thrive (child) 03/11/2016  . Weakness generalized 03/11/2016  . ARF (acute renal failure) ([place]) 03/04/2016  . Cancer of trigone of urinary bladder ([place]) 03/02/2016  . Absolute anemia 02/04/2016  . Airway hyperreactivity 02/04/2016  . Celiac disease 02/04/2016  . Gastric catarrh 02/04/2016  . Acid reflux 02/04/2016  . Combined fat and carbohydrate induced hyperlipemia 02/04/2016  . C. difficile colitis 01/22/2016  . Urinary obstruction 01/21/2016  . COPD (chronic obstructive pulmonary disease) ([place]) 01/21/2016  . Controlled type 2 diabetes mellitus with stage 4 chronic kidney disease, with  long-term current use of insulin ([place]) 01/21/2016  . Essential hypertension 01/21/2016  . Acute renal failure superimposed on stage 4 chronic kidney disease ([place]) 01/20/2016  . Anemia in chronic kidney disease 01/20/2016  . Sepsis ([place]) 12/18/2015  . UTI (lower urinary tract infection) 12/18/2015  . Arthritis of knee, degenerative 05/10/2015  . Knee strain 03/26/2015  . Other intervertebral disc displacement, lumbar region 03/04/2015  . Degenerative arthritis of lumbar spine 03/04/2015  . Injury of tendon of upper extremity 11/12/2014  . Atherosclerosis of abdominal aorta ([place]) 11/02/2014  . Chronic kidney disease, stage IV (severe) ([place]) 11/02/2014  . Obstructive apnea 11/02/2014  . Complete rotator cuff rupture of left shoulder 10/05/2014  . Arthritis of shoulder region, degenerative 10/05/2014  . CAD in native artery 12/08/2013  . Benign essential HTN 12/08/2013  . Type 2 diabetes mellitus ([place]) 12/08/2013    Past Surgical History:  Procedure Laterality Date  . ABDOMINAL HYSTERECTOMY    . CYSTOSCOPY W/ RETROGRADES Bilateral 02/17/2016   Procedure: CYSTOSCOPY WITH RETROGRADE PYELOGRAM;  Surgeon: Hollice Espy, MD;  Location: [facility] ORS;  Service: Urology;  Laterality: Bilateral;  . CYSTOSCOPY WITH STENT PLACEMENT Left 01/21/2016   Procedure: CYSTOSCOPY WITH double J STENT PLACEMENT;  Surgeon: Franchot Gallo, MD;  Location: [facility] ORS;  Service: Urology;  Laterality: Left;  . KIDNEY SURGERY  01/21/2016   IR NEPHROSTOMY PLACEMENT LEFT   . PERIPHERAL VASCULAR CATHETERIZATION N/A 04/07/2016   Procedure: Glori Luis Cath Insertion;  Surgeon: Katha Cabal, MD;  Location: [place] [facility];  Service: Cardiovascular;  Laterality: N/A;  . ROTATOR CUFF REPAIR  left  . TRANSURETHRAL RESECTION OF BLADDER TUMOR N/A 02/17/2016   Procedure: TRANSURETHRAL RESECTION OF BLADDER TUMOR (TURBT)-LARGE;  Surgeon: Hollice Espy, MD;  Location: [facility] ORS;  Service: Urology;  Laterality: N/A;  .  URETEROSCOPY Left 02/17/2016   Procedure: URETEROSCOPY;  Surgeon: Hollice Espy, MD;  Location: [facility] ORS;  Service: Urology;  Laterality: Left;    Allergies Percocet [oxycodone-acetaminophen]  Social History Social History  Substance Use Topics  . Smoking status: Former Smoker    Quit date: 07/20/1993  . Smokeless tobacco: Never Used     Comment: 01/22/2016   "  quit smoking many years ago "  . Alcohol use No    Review of Systems Constitutional: Negative for fever. Cardiovascular: Positive for chest pain Respiratory: Negative for shortness of breath. Gastrointestinal: Negative for abdominal pain, vomiting and diarrhea. Genitourinary: Positive for dysuria Musculoskeletal: Negative for back pain. Skin: Negative for rash. Neurological: Negative for headaches, positive for dizziness and weakness  10-point ROS otherwise negative.  ____________________________________________   PHYSICAL EXAM:  VITAL SIGNS: ED Triage Vitals  Enc Vitals Group     BP 05/04/16 1110 (!) 108/54     Pulse Rate 05/04/16 1110 99     Resp 05/04/16 1110 18     Temp 05/04/16 1110 97.8 F (36.6 C)     Temp Source 05/04/16 1110 Oral     SpO2 05/04/16 1110 100 %     Weight 05/04/16 1108 170 lb (77.1 kg)     Height 05/04/16 1108 5' 8"  (1.727 m)     Head Circumference --      Peak Flow --      Pain Score 05/04/16 1108 8     Pain Loc --      Pain Edu? --      Excl. in [place]? --    Constitutional: Alert and oriented. Well appearing and in no distress. Eyes: Conjunctivae are normal. PERRL. Normal extraocular movements. ENT   Head: Normocephalic and atraumatic.   Nose: No congestion/rhinnorhea.   Mouth/Throat: Mucous membranes are moist.   Neck: No stridor. Cardiovascular: Normal rate, regular rhythm. No murmurs, rubs, or gallops. Respiratory: Normal respiratory effort without tachypnea nor retractions. Breath sounds are clear and equal bilaterally. No  wheezes/rales/rhonchi. Gastrointestinal: Soft and nontender. Normal bowel sounds Musculoskeletal: Nontender with normal range of motion in all extremities. No lower extremity tenderness nor edema. Neurologic:  Normal speech and language. No gross focal neurologic deficits are appreciated.  Skin:  Skin is warm, dry and intact. No rash noted. Psychiatric: Mood and affect are normal. Speech and behavior are normal.  ____________________________________________  EKG: Interpreted by me.Sinus tachycardia with a rate of 101 bpm, first degree AV block, normal QRS, normal Q-T. Normal axis.  ____________________________________________  ED COURSE:  Pertinent labs & imaging results that were available during my care of the patient were reviewed by me and considered in my medical decision making (see chart for details). Clinical Course  Patient presents to the ER with nonspecific chest pain, we will assess with labs and imaging.  Procedures ____________________________________________   LABS (pertinent positives/negatives)  Labs Reviewed  BASIC METABOLIC PANEL - Abnormal; Notable for the following:       Result Value   Sodium 133 (*)    CO2 16 (*)    Glucose, Bld 148 (*)    BUN 72 (*)    Creatinine, Ser 6.48 (*)    Calcium 8.7 (*)    GFR calc non Af Amer 6 (*)  GFR calc Af Amer 6 (*)    All other components within normal limits  CBC - Abnormal; Notable for the following:    RBC 2.87 (*)    Hemoglobin 7.8 (*)    HCT 23.8 (*)    RDW 16.9 (*)    All other components within normal limits  TROPONIN I  URINALYSIS COMPLETEWITH MICROSCOPIC ([facility] ONLY)  CRITICAL CARE Performed by: Earleen Newport   Total critical care time: 30 minutes  Critical care time was exclusive of separately billable procedures and treating other patients.  Critical care was necessary to treat or prevent imminent or life-threatening deterioration.  Critical care was time spent personally by me on the  following activities: development of treatment plan with patient and/or surrogate as well as nursing, discussions with consultants, evaluation of patient's response to treatment, examination of patient, obtaining history from patient or surrogate, ordering and performing treatments and interventions, ordering and review of laboratory studies, ordering and review of radiographic studies, pulse oximetry and re-evaluation of patient's condition.   RADIOLOGY  Chest x-ray IMPRESSION: Minimal left basilar subsegmental atelectasis or scarring. ____________________________________________  FINAL ASSESSMENT AND PLAN  Chest pain, acute renal failure  Plan: Patient with labs and imaging as dictated above. Patient was recently in the hospital for acute renal failure due to presumed ATN. We will obtain ultrasound imaging of her kidneys, I will discussed with nephrology. She will need to be readmitted into the hospital, I will start her on gentle saline infusion.   Earleen Newport, MD   Note: This dictation was prepared with Dragon dictation. Any transcriptional errors that result from this process are unintentional    Earleen Newport, MD 05/04/16 1351

## 2016-05-04 NOTE — ED Notes (Signed)
Patient transported to Ultrasound 

## 2016-05-04 NOTE — Consult Note (Addendum)
I have been asked to see the patient by Dr. Lenise Arena, for evaluation and management of renal insufficiency and left-sided hydronephrosis.  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 She has a history of chronic renal failure as well. She was noted to have left-sided hydronephrosis from obstructing bladder tumor that was resected in July and a stent was placed at the time. She subsequently undergone chemotherapy and radiation for treatment of her muscle invasive bladder cancer. She has been receiving gemcitabine a weekly basis, although she was unable to get the chemotherapy last week because she was feeling so sick. The patient states that she has had a very poor appetite, poor fluid intake, and associated nausea and vomiting. She was also having some hematuria. She denies any dysuria. She does have baseline urinary incontinence.  The patient presented to the emergency department today with complaints of chest pain, feeling lightheaded, and weak. Labs demonstrated acute on chronic renal failure. An ultrasound was obtained which demonstrated mild worsening hydronephrosis, the stent was visualized within the renal pelvis and in the bladder.  Since the patient has been admitted she has received IV fluids and states that she is feeling significantly better at this time.  Review of systems: A 12 point comprehensive review of systems was obtained and is negative unless otherwise stated in the history of present illness.  Patient Active Problem List   Diagnosis Date Noted  . Acute on chronic renal failure ([place]) 05/04/2016  . Seizure ([place]) 04/17/2016  . Palliative care by specialist   . DNR (do not resuscitate) discussion   . C. difficile diarrhea 03/11/2016  . Anemia 03/11/2016  . Hypotension 03/11/2016  . Bladder cancer ([place]) 03/11/2016  . Acidosis 03/11/2016  . Failure to thrive (child)  03/11/2016  . Weakness generalized 03/11/2016  . ARF (acute renal failure) ([place]) 03/04/2016  . Cancer of trigone of urinary bladder ([place]) 03/02/2016  . Absolute anemia 02/04/2016  . Airway hyperreactivity 02/04/2016  . Celiac disease 02/04/2016  . Gastric catarrh 02/04/2016  . Acid reflux 02/04/2016  . Combined fat and carbohydrate induced hyperlipemia 02/04/2016  . C. difficile colitis 01/22/2016  . Urinary obstruction 01/21/2016  . COPD (chronic obstructive pulmonary disease) ([place]) 01/21/2016  . Controlled type 2 diabetes mellitus with stage 4 chronic kidney disease, with long-term current use of insulin ([place]) 01/21/2016  . Essential hypertension 01/21/2016  . Acute renal failure superimposed on stage 4 chronic kidney disease ([place]) 01/20/2016  . Anemia in chronic kidney disease 01/20/2016  . Sepsis ([place]) 12/18/2015  . UTI (lower urinary tract infection) 12/18/2015  . Arthritis of knee, degenerative 05/10/2015  . Knee strain 03/26/2015  . Other intervertebral disc displacement, lumbar region 03/04/2015  . Degenerative arthritis of lumbar spine 03/04/2015  . Injury of tendon of upper extremity 11/12/2014  . Atherosclerosis of abdominal aorta ([place]) 11/02/2014  . Chronic kidney disease, stage IV (severe) ([place]) 11/02/2014  . Obstructive apnea 11/02/2014  . Complete rotator cuff rupture of left shoulder 10/05/2014  . Arthritis of shoulder region, degenerative 10/05/2014  . CAD in native artery 12/08/2013  . Benign essential HTN 12/08/2013  . Type 2 diabetes mellitus ([place]) 12/08/2013    No current facility-administered medications on file prior to encounter.    Current Outpatient Prescriptions on File Prior to Encounter  Medication Sig Dispense Refill  . Albuterol Sulfate (PROAIR RESPICLICK) 800 (90 Base) MCG/ACT AEPB Inhale 2 puffs into the lungs 4 (four) times  daily as needed (wheezing).     . mirabegron ER (MYRBETRIQ) 25 MG TB24 tablet Take 1 tablet (25 mg total) by mouth daily.  30 tablet 1  . atorvastatin (LIPITOR) 40 MG tablet Take 40 mg by mouth daily at 6 PM.     . Cholecalciferol (VITAMIN D-3) 1000 units CAPS Take 1 capsule by mouth every morning.    . dronabinol (MARINOL) 2.5 MG capsule Take 1 capsule (2.5 mg total) by mouth 2 (two) times daily before lunch and supper. (Patient not taking: Reported on 05/04/2016) 60 capsule 5  . lidocaine-prilocaine (EMLA) cream Apply 1 application topically as needed. (Patient not taking: Reported on 05/04/2016) 30 g 3  . NON FORMULARY Place 1 Units into the nose at bedtime. CPAP Time of use 2100-0600      Past Medical History:  Diagnosis Date  . Anemia associated with chronic renal failure   . Arthritis   . Chronic kidney disease   . CKD (chronic kidney disease)   . COPD (chronic obstructive pulmonary disease) ([place])   . Diabetes mellitus without complication ([place])   . Hypertension   . Sleep apnea    uses cpap  . Urinary obstruction 01/2016    Past Surgical History:  Procedure Laterality Date  . ABDOMINAL HYSTERECTOMY    . CYSTOSCOPY W/ RETROGRADES Bilateral 02/17/2016   Procedure: CYSTOSCOPY WITH RETROGRADE PYELOGRAM;  Surgeon: Hollice Espy, MD;  Location: [facility] ORS;  Service: Urology;  Laterality: Bilateral;  . CYSTOSCOPY WITH STENT PLACEMENT Left 01/21/2016   Procedure: CYSTOSCOPY WITH double J STENT PLACEMENT;  Surgeon: Franchot Gallo, MD;  Location: [facility] ORS;  Service: Urology;  Laterality: Left;  . KIDNEY SURGERY  01/21/2016   IR NEPHROSTOMY PLACEMENT LEFT   . PERIPHERAL VASCULAR CATHETERIZATION N/A 04/07/2016   Procedure: Glori Luis Cath Insertion;  Surgeon: Katha Cabal, MD;  Location: [place] [facility];  Service: Cardiovascular;  Laterality: N/A;  . ROTATOR CUFF REPAIR     left  . TRANSURETHRAL RESECTION OF BLADDER TUMOR N/A 02/17/2016   Procedure: TRANSURETHRAL RESECTION OF BLADDER TUMOR (TURBT)-LARGE;  Surgeon: Hollice Espy, MD;  Location: [facility] ORS;  Service: Urology;  Laterality: N/A;  .  URETEROSCOPY Left 02/17/2016   Procedure: URETEROSCOPY;  Surgeon: Hollice Espy, MD;  Location: [facility] ORS;  Service: Urology;  Laterality: Left;    Social History  Substance Use Topics  . Smoking status: Former Smoker    Quit date: 07/20/1993  . Smokeless tobacco: Never Used     Comment: 01/22/2016   "  quit smoking many years ago "  . Alcohol use No    Family History  Problem Relation Age of Onset  . Diabetes Mother   . Cancer Father   . Breast cancer Neg Hx   . Kidney disease Neg Hx     PE: Vitals:   05/04/16 1501 05/04/16 1530 05/04/16 1600 05/04/16 1625  BP: 110/68 (!) 104/55 122/64 (!) 129/55  Pulse: 91 83 88 86  Resp: 20 (!) 23 18 20   Temp:    97.7 F (36.5 C)  TempSrc:    Oral  SpO2: 98% 96% 99% 100%  Weight:    74.5 kg (164 lb 3.2 oz)  Height:       Patient appears to be in no acute distress  patient is alert and oriented x3 Atraumatic normocephalic head No cervical or supraclavicular lymphadenopathy appreciated No increased work of breathing, no audible wheezes/rhonchi Regular sinus rhythm/rate Abdomen is soft, nontender, nondistended, no CVA or suprapubic tenderness Lower  extremities are symmetric without appreciable edema Grossly neurologically intact No identifiable skin lesions   Recent Labs  05/04/16 1109  WBC 6.9  HGB 7.8*  HCT 23.8*    Recent Labs  05/04/16 1109  NA 133*  K 4.8  CL 104  CO2 16*  GLUCOSE 148*  BUN 72*  CREATININE 6.48*  CALCIUM 8.7*   No results for input(s): LABPT, INR in the last 72 hours. No results for input(s): LABURIN in the last 72 hours. Results for orders placed or performed in visit on 04/23/16  Urine culture     Status: None   Collection Time: 04/23/16 11:38 AM  Result Value Ref Range Status   Specimen Description URINE, CLEAN CATCH  Final   Special Requests NONE  Final   Culture NO GROWTH Performed at [facility]   Final   Report Status 04/24/2016 FINAL  Final    Imaging: I have  independently reviewed the patient's renal ultrasound performed today compared it with an ultrasound performed in August 2017. The patient has slightly worsening hydronephrosis and thickening of her bladder wall.  Imp: The patient has muscle invasive bladder cancer currently getting bladder preserving treatment with a baseline of renal insufficiency presenting with chest pain and found to have worsening kidney function and mildly worsening hydronephrosis of her left side.  The patient denies any fevers/chills or associated flank pain.  Discussion:   In July, the patient had a thick interval stent placed in her left kidney which typically does not really obstruct from extrinsic compression from malignancy. It should be changed within 6 months. She does have some worsening of her hydronephrosis albeit very mild. She also has a full bladder. She likely has lost some compliance of her bladder and thus may have some reflux associated with her hydronephrosis. She also very well may have an occluded stent. However, the large component of her renal insufficiency if not all of it is related to her prerenal azotemia because of her dehydration and poor fluid intake over the past week. I recommend that we hydrate the patient, place a Foley catheter, and see how she does. I expect that her renal function will improve. I did order a spot urine sodium so that a fractional excretion of sodium could be calculated. I also ordered a Foley catheter to be placed.  If the patient's creatinine does not improve with hydration, she may benefit from a stent exchange procedure. Ideally, we will postpone the stent exchange so that we could also plan to re-resect any bladder tumors in a more planned and coordinated fashion. If able to, we would hold off on surgery until she completes her chemotherapy.  Summary:  1. Continue with IVF/hydration. 2. Calculate FeNa 3. Place foley 4. If no improvement in renal function consider stent  exchange.  (NPO p MN)     Thank you for involving me in this patient's care,  we will continue to follow along. Louis Meckel W

## 2016-05-04 NOTE — ED Triage Notes (Signed)
Pt to ed with c/o chest pain intermittently since yesterday.  Pt reports dizziness and weakness associated with chest pain.  Pt denies sob.

## 2016-05-05 ENCOUNTER — Ambulatory Visit: Payer: Medicare Other

## 2016-05-05 DIAGNOSIS — E43 Unspecified severe protein-calorie malnutrition: Secondary | ICD-10-CM | POA: Insufficient documentation

## 2016-05-05 LAB — HEMOGLOBIN AND HEMATOCRIT, BLOOD
HCT: 22.9 % — ABNORMAL LOW (ref 35.0–47.0)
HEMATOCRIT: 22.5 % — AB (ref 35.0–47.0)
HEMOGLOBIN: 7.4 g/dL — AB (ref 12.0–16.0)
Hemoglobin: 7.2 g/dL — ABNORMAL LOW (ref 12.0–16.0)

## 2016-05-05 LAB — CBC
HCT: 22.4 % — ABNORMAL LOW (ref 35.0–47.0)
HEMOGLOBIN: 7.4 g/dL — AB (ref 12.0–16.0)
MCH: 27.3 pg (ref 26.0–34.0)
MCHC: 33.2 g/dL (ref 32.0–36.0)
MCV: 82.4 fL (ref 80.0–100.0)
PLATELETS: 135 10*3/uL — AB (ref 150–440)
RBC: 2.72 MIL/uL — AB (ref 3.80–5.20)
RDW: 17.1 % — ABNORMAL HIGH (ref 11.5–14.5)
WBC: 7.1 10*3/uL (ref 3.6–11.0)

## 2016-05-05 LAB — BASIC METABOLIC PANEL
ANION GAP: 10 (ref 5–15)
BUN: 71 mg/dL — ABNORMAL HIGH (ref 6–20)
CALCIUM: 8.4 mg/dL — AB (ref 8.9–10.3)
CO2: 15 mmol/L — ABNORMAL LOW (ref 22–32)
CREATININE: 5.75 mg/dL — AB (ref 0.44–1.00)
Chloride: 109 mmol/L (ref 101–111)
GFR, EST AFRICAN AMERICAN: 7 mL/min — AB (ref >60)
GFR, EST NON AFRICAN AMERICAN: 6 mL/min — AB (ref >60)
Glucose, Bld: 96 mg/dL (ref 65–99)
Potassium: 5.2 mmol/L — ABNORMAL HIGH (ref 3.5–5.1)
SODIUM: 134 mmol/L — AB (ref 135–145)

## 2016-05-05 LAB — GLUCOSE, CAPILLARY: GLUCOSE-CAPILLARY: 88 mg/dL (ref 65–99)

## 2016-05-05 MED ORDER — TRAMADOL HCL 50 MG PO TABS
50.0000 mg | ORAL_TABLET | Freq: Three times a day (TID) | ORAL | Status: DC | PRN
  Administered 2016-05-05: 50 mg via ORAL
  Filled 2016-05-05: qty 1

## 2016-05-05 MED ORDER — GABAPENTIN 100 MG PO CAPS
100.0000 mg | ORAL_CAPSULE | Freq: Two times a day (BID) | ORAL | Status: DC
  Administered 2016-05-05 – 2016-05-09 (×8): 100 mg via ORAL
  Filled 2016-05-05 (×8): qty 1

## 2016-05-05 MED ORDER — SODIUM POLYSTYRENE SULFONATE 15 GM/60ML PO SUSP
15.0000 g | Freq: Once | ORAL | Status: AC
Start: 2016-05-05 — End: 2016-05-05
  Administered 2016-05-05: 15 g via ORAL
  Filled 2016-05-05: qty 60

## 2016-05-05 NOTE — Progress Notes (Signed)
Urology Consult Follow Up  Subjective: Foley in place draining light pink urine. Good urine output overnight. States that she feels better this morning than on admission.   Anti-infectives: Anti-infectives    None      Current Facility-Administered Medications  Medication Dose Route Frequency Provider Last Rate Last Dose  . 0.9 %  sodium chloride infusion   Intravenous Continuous Epifanio Lesches, MD 75 mL/hr at 05/05/16 0522 1,000 mL at 05/05/16 0522  . albuterol (PROVENTIL) (2.5 MG/3ML) 0.083% nebulizer solution 2.5 mg  2.5 mg Nebulization QID PRN Epifanio Lesches, MD      . atorvastatin (LIPITOR) tablet 40 mg  40 mg Oral q1800 Epifanio Lesches, MD   40 mg at 05/04/16 1758  . bisacodyl (DULCOLAX) EC tablet 5 mg  5 mg Oral Daily PRN Epifanio Lesches, MD      . cholecalciferol (VITAMIN D) tablet 1,000 Units  1,000 Units Oral q morning - 10a Epifanio Lesches, MD   1,000 Units at 05/04/16 1758  . docusate sodium (COLACE) capsule 100 mg  100 mg Oral BID Epifanio Lesches, MD   100 mg at 05/04/16 1758  . dronabinol (MARINOL) capsule 2.5 mg  2.5 mg Oral BID AC Epifanio Lesches, MD   2.5 mg at 05/04/16 1758  . gabapentin (NEURONTIN) capsule 600 mg  600 mg Oral TID Epifanio Lesches, MD   600 mg at 05/05/16 0010  . heparin injection 5,000 Units  5,000 Units Subcutaneous Q12H Epifanio Lesches, MD   5,000 Units at 05/05/16 0010  . mirabegron ER (MYRBETRIQ) tablet 25 mg  25 mg Oral Daily Epifanio Lesches, MD   25 mg at 05/04/16 1800  . ondansetron (ZOFRAN) tablet 4 mg  4 mg Oral Q6H PRN Epifanio Lesches, MD       Or  . ondansetron (ZOFRAN) injection 4 mg  4 mg Intravenous Q6H PRN Epifanio Lesches, MD      . opium-belladonna (B&O SUPPRETTES) 16.2-60 MG suppository 1 suppository  1 suppository Rectal Q8H PRN Ardis Hughs, MD      . pantoprazole (PROTONIX) EC tablet 40 mg  40 mg Oral Daily Epifanio Lesches, MD   40 mg at 05/04/16 1800     Objective: Vital  signs in last 24 hours: Temp:  [97.4 F (36.3 C)-98 F (36.7 C)] 98 F (36.7 C) (10/17 0409) Pulse Rate:  [80-99] 90 (10/17 0409) Resp:  [16-23] 20 (10/17 0409) BP: (104-129)/(54-74) 118/55 (10/17 0409) SpO2:  [96 %-100 %] 99 % (10/17 0409) Weight:  [164 lb 3.2 oz (74.5 kg)-171 lb 14.4 oz (78 kg)] 171 lb 14.4 oz (78 kg) (10/17 0425)  Intake/Output from previous day: 10/16 0701 - 10/17 0700 In: 1632.5 [P.O.:290; I.V.:1342.5] Out: 900 [Urine:900] Intake/Output this shift: No intake/output data recorded.   Physical Exam  Constitutional: She is oriented to person, place, and time and well-developed, well-nourished, and in no distress.  HENT:  Head: Normocephalic and atraumatic.  Abdominal: Soft. She exhibits no distension.  Genitourinary:  Genitourinary Comments: Foley draining light pink urine  Neurological: She is alert and oriented to person, place, and time.  Skin: Skin is warm and dry.  Psychiatric: Affect normal.    Lab Results:   Recent Labs  05/04/16 1109 05/05/16 0500  WBC 6.9 7.1  HGB 7.8* 7.4*  HCT 23.8* 22.4*  PLT 151 135*   BMET  Recent Labs  05/04/16 1109 05/05/16 0500  NA 133* 134*  K 4.8 5.2*  CL 104 109  CO2 16* 15*  GLUCOSE 148*  96  BUN 72* 71*  CREATININE 6.48* 5.75*  CALCIUM 8.7* 8.4*    Studies/Results: Dg Chest 2 View  Result Date: 05/04/2016 CLINICAL DATA:  Chest pain. EXAM: CHEST  2 VIEW COMPARISON:  Radiograph of April 17, 2016. FINDINGS: The heart size and mediastinal contours are within normal limits. No pneumothorax or pleural effusion is noted. Stable position of right internal jugular Port-A-Cath. Minimal left basilar subsegmental atelectasis or scarring is noted. Right lung is clear. The visualized skeletal structures are unremarkable. IMPRESSION: Minimal left basilar subsegmental atelectasis or scarring. Electronically Signed   By: Marijo Conception, M.D.   On: 05/04/2016 12:33   US Renal  Result Date:  05/04/2016 CLINICAL DATA:  Acute renal failure.  Bladder cancer. EXAM: RENAL / URINARY TRACT ULTRASOUND COMPLETE COMPARISON:  Ultrasound dated 03/04/2016 and CT scan dated 01/20/2016 FINDINGS: Right Kidney: Length: 10.2 cm. Slight increased echogenicity of the renal parenchyma. 1 cm slightly exophytic cyst on the medial aspect of the upper pole. No hydronephrosis. Left Kidney: Length: 10.1 cm. Increased echogenicity of the renal parenchyma, unchanged. 2.2 cm complex cyst in the mid left kidney. This has diminished in size since the prior ultrasound. Recurrent moderate left hydronephrosis, new since the prior ultrasound. Stent in the renal pelvis. Bladder: Irregular thickening of the bladder wall. Ureteral stent in place on the left. Right ureteral jet identified. IMPRESSION: 1. Recurrent left hydronephrosis even though there is a left ureteral stent in place. 2. Decreased size and increased complexity of face cystic lesion in the lateral aspect of the left kidney. 3. Again noted is diffuse thickening and irregularity of the bladder wall with consistent with the history of carcinoma of the bladder. Electronically Signed   By: Lorriane Shire M.D.   On: 05/04/2016 14:06    Assessment: 67 old female with multiple medical comorbidities and muscle invasive bladder cancer being managed currently undergoing chemotherapy/radiation as not a cystectomy candidate admitted with chest pain, deconditioning and renal failure.  She does have a history of left distal ureteral obstruction from her tumor currently with a [employer] Optima indwelling stent.  Slight interval increase in hydronephrosis on renal ultrasound yesterday with a full bladder.  Today, creatinine slowly trending downward with hydration.  Suspect renal failure multifactorial.    Plan: -No plan for stent exchange today given downward trending creatinine  -[facility] per primary team  -Maintain Foley catheter for maximal bladder and kidney decompression   -Nothing by mouth at midnight again until tomorrow so a.m. labs return      LOS: 1 day    Hollice Espy 05/05/2016

## 2016-05-05 NOTE — Care Management Important Message (Signed)
Important Message  Patient Details  Name: Barbara [name] MRN: [id number] Date of Birth: [birth year]   Medicare Important Message Given:  Yes    Shelbie Ammons, RN 05/05/2016, 8:15 AM

## 2016-05-05 NOTE — Progress Notes (Signed)
[facility]  ROUNDING NOTE   Subjective:  Renal function appears to be worse. We recently saw her for acute renal failure. Most recent renal ultrasound shows return of hydronephrosis in the left kidney despite  Ureteral stent. Hematuria also noted.   Objective:  Vital signs in last 24 hours:  Temp:  [97.4 F (36.3 C)-98 F (36.7 C)] 97.5 F (36.4 C) (10/17 1315) Pulse Rate:  [83-90] 83 (10/17 1315) Resp:  [16-23] 16 (10/17 1315) BP: (104-129)/(55-64) 120/55 (10/17 1315) SpO2:  [96 %-100 %] 100 % (10/17 1315) Weight:  [74.5 kg (164 lb 3.2 oz)-78 kg (171 lb 14.4 oz)] 78 kg (171 lb 14.4 oz) (10/17 0425)  Weight change:  Filed Weights   05/04/16 1108 05/04/16 1625 05/05/16 0425  Weight: 77.1 kg (170 lb) 74.5 kg (164 lb 3.2 oz) 78 kg (171 lb 14.4 oz)    Intake/Output: I/O last 3 completed shifts: In: 1632.5 [P.O.:290; I.V.:1342.5] Out: 900 [Urine:900]   Intake/Output this shift:  Total I/O In: 240 [P.O.:240] Out: -   Physical Exam: General: No acute distress  Head: Normocephalic, atraumatic. Moist oral mucosal membranes  Eyes: Anicteric  Neck: Supple, trachea midline  Lungs:  Clear to auscultation, normal effort  Heart: S1S2 no rubs  Abdomen:  Soft, nontender, BS present  Extremities: no peripheral edema.  Neurologic: Nonfocal, moving all four extremities  Skin: No lesions       Basic Metabolic Panel:  Recent Labs Lab 05/04/16 1109 05/05/16 0500  NA 133* 134*  K 4.8 5.2*  CL 104 109  CO2 16* 15*  GLUCOSE 148* 96  BUN 72* 71*  CREATININE 6.48* 5.75*  CALCIUM 8.7* 8.4*    Liver Function Tests: No results for input(s): AST, ALT, ALKPHOS, BILITOT, PROT, ALBUMIN in the last 168 hours. No results for input(s): LIPASE, AMYLASE in the last 168 hours. No results for input(s): AMMONIA in the last 168 hours.  CBC:  Recent Labs Lab 05/04/16 1109 05/05/16 0500 05/05/16 1314  WBC 6.9 7.1  --   HGB 7.8* 7.4* 7.4*  HCT 23.8* 22.4* 22.9*   MCV 82.8 82.4  --   PLT 151 135*  --     Cardiac Enzymes:  Recent Labs Lab 05/04/16 1109 05/04/16 1753  TROPONINI <0.03 <0.03    BNP: Invalid input(s): POCBNP  CBG:  Recent Labs Lab 05/05/16 2683  GLUCAP 2    Microbiology: Results for orders placed or performed in visit on 04/23/16  Urine culture     Status: None   Collection Time: 04/23/16 11:38 AM  Result Value Ref Range Status   Specimen Description URINE, CLEAN CATCH  Final   Special Requests NONE  Final   Culture NO GROWTH Performed at [facility]   Final   Report Status 04/24/2016 FINAL  Final    Coagulation Studies: No results for input(s): LABPROT, INR in the last 72 hours.  Urinalysis:  Recent Labs  05/04/16 1109  COLORURINE RED*  LABSPEC 1.012  PHURINE 7.0  GLUCOSEU 50*  HGBUR 3+*  BILIRUBINUR NEGATIVE  KETONESUR NEGATIVE  PROTEINUR 100*  NITRITE NEGATIVE  LEUKOCYTESUR 3+*      Imaging: Dg Chest 2 View  Result Date: 05/04/2016 CLINICAL DATA:  Chest pain. EXAM: CHEST  2 VIEW COMPARISON:  Radiograph of April 17, 2016. FINDINGS: The heart size and mediastinal contours are within normal limits. No pneumothorax or pleural effusion is noted. Stable position of right internal jugular Port-A-Cath. Minimal left basilar subsegmental atelectasis or scarring is noted.  Right lung is clear. The visualized skeletal structures are unremarkable. IMPRESSION: Minimal left basilar subsegmental atelectasis or scarring. Electronically Signed   By: Marijo Conception, M.D.   On: 05/04/2016 12:33   US Renal  Result Date: 05/04/2016 CLINICAL DATA:  Acute renal failure.  Bladder cancer. EXAM: RENAL / URINARY TRACT ULTRASOUND COMPLETE COMPARISON:  Ultrasound dated 03/04/2016 and CT scan dated 01/20/2016 FINDINGS: Right Kidney: Length: 10.2 cm. Slight increased echogenicity of the renal parenchyma. 1 cm slightly exophytic cyst on the medial aspect of the upper pole. No hydronephrosis. Left Kidney: Length:  10.1 cm. Increased echogenicity of the renal parenchyma, unchanged. 2.2 cm complex cyst in the mid left kidney. This has diminished in size since the prior ultrasound. Recurrent moderate left hydronephrosis, new since the prior ultrasound. Stent in the renal pelvis. Bladder: Irregular thickening of the bladder wall. Ureteral stent in place on the left. Right ureteral jet identified. IMPRESSION: 1. Recurrent left hydronephrosis even though there is a left ureteral stent in place. 2. Decreased size and increased complexity of face cystic lesion in the lateral aspect of the left kidney. 3. Again noted is diffuse thickening and irregularity of the bladder wall with consistent with the history of carcinoma of the bladder. Electronically Signed   By: Lorriane Shire M.D.   On: 05/04/2016 14:06     Medications:   . sodium chloride 1,000 mL (05/05/16 0522)   . atorvastatin  40 mg Oral q1800  . cholecalciferol  1,000 Units Oral q morning - 10a  . docusate sodium  100 mg Oral BID  . gabapentin  100 mg Oral BID  . heparin subcutaneous  5,000 Units Subcutaneous Q12H  . mirabegron ER  25 mg Oral Daily  . pantoprazole  40 mg Oral Daily   albuterol, bisacodyl, ondansetron **OR** ondansetron (ZOFRAN) IV, opium-belladonna, traMADol  Assessment/ Plan:  75 y.o. female with long-standing hypertension, diabetes type 2, hyperlipidemia, osteoarthritis, obstructive sleep apnea, bladder cancer 8/17 s/p TURBT and radiation therapy/chemo with gemcitabine.  1. Acute renal failure/chronic kidney disease stage IV/recurrent hydronephrosis.  Baseline creatinine 2.3 EGFR 23. Acute renal failure now could be related to recurrent hydronephrosis. -  Patient with recurrent acute renal failure.  Repeat renal ultrasound shows recurrent left-sided hydronephrosis despite left ureteral stent placement.  We will obtain urology consultation.  2. Anemia of chronic kidney disease. Hemoglobin down to 7.4.  Consider transfusion for  hemoglobin of 7 or less.  3. Hyponatremia. Serum sodium slightly low at 134.  Continue IV fluid hydration with 0.9 normal saline.   LOS: 1 Barbara [name] 10/17/20173:22 PM

## 2016-05-05 NOTE — Progress Notes (Signed)
[facility] [place] NAME: Barbara [name]    MR#:  [id number]  DATE OF BIRTH:  02/21/1941  SUBJECTIVE:  CHIEF COMPLAINT:  Patient denies any chest pain during my examination. Improved with tramadol.  REVIEW OF SYSTEMS:  CONSTITUTIONAL: No fever, fatigue or weakness.  EYES: No blurred or double vision.  EARS, NOSE, AND THROAT: No tinnitus or ear pain.  RESPIRATORY: No cough, shortness of breath, wheezing or hemoptysis.  CARDIOVASCULAR: No chest pain, orthopnea, edema.  GASTROINTESTINAL: No nausea, vomiting, diarrhea or abdominal pain.  GENITOURINARY: No dysuria, hematuria.  ENDOCRINE: No polyuria, nocturia,  HEMATOLOGY: No anemia, easy bruising or bleeding SKIN: No rash or lesion. MUSCULOSKELETAL: No joint pain or arthritis.   NEUROLOGIC: No tingling, numbness, weakness.  PSYCHIATRY: No anxiety or depression.   DRUG ALLERGIES:   Allergies  Allergen Reactions  . Percocet [Oxycodone-Acetaminophen] Hives    VITALS:  Blood pressure (!) 120/55, pulse 83, temperature 97.5 F (36.4 C), temperature source Oral, resp. rate 16, height 5' 8"  (1.727 m), weight 78 kg (171 lb 14.4 oz), SpO2 100 %.  PHYSICAL EXAMINATION:  GENERAL:  75 y.o.-year-old patient lying in the bed with no acute distress.  EYES: Pupils equal, round, reactive to light and accommodation. No scleral icterus. Extraocular muscles intact.  HEENT: Head atraumatic, normocephalic. Oropharynx and nasopharynx clear.  NECK:  Supple, no jugular venous distention. No thyroid enlargement, no tenderness.  LUNGS: Moderate breath sounds bilaterally, no wheezing, rales,rhonchi or crepitation. No use of accessory muscles of respiration.  CARDIOVASCULAR: S1, S2 normal. No murmurs, rubs, or gallops.  ABDOMEN: Soft, nontender, nondistended. Bowel sounds present. No organomegaly or mass.  EXTREMITIES: No pedal edema, cyanosis, or clubbing.  NEUROLOGIC: Cranial nerves II through XII are  intact. Sensory intact gait not checked  PSYCHIATRIC: The patient is alert and oriented x 3.  SKIN: No obvious rash, lesion, or ulcer.    LABORATORY PANEL:   CBC  Recent Labs Lab 05/05/16 0500 05/05/16 1314  WBC 7.1  --   HGB 7.4* 7.4*  HCT 22.4* 22.9*  PLT 135*  --    ------------------------------------------------------------------------------------------------------------------  Chemistries   Recent Labs Lab 05/05/16 0500  NA 134*  K 5.2*  CL 109  CO2 15*  GLUCOSE 96  BUN 71*  CREATININE 5.75*  CALCIUM 8.4*   ------------------------------------------------------------------------------------------------------------------  Cardiac Enzymes  Recent Labs Lab 05/04/16 1753  TROPONINI <0.03   ------------------------------------------------------------------------------------------------------------------  RADIOLOGY:  Dg Chest 2 View  Result Date: 05/04/2016 CLINICAL DATA:  Chest pain. EXAM: CHEST  2 VIEW COMPARISON:  Radiograph of April 17, 2016. FINDINGS: The heart size and mediastinal contours are within normal limits. No pneumothorax or pleural effusion is noted. Stable position of right internal jugular Port-A-Cath. Minimal left basilar subsegmental atelectasis or scarring is noted. Right lung is clear. The visualized skeletal structures are unremarkable. IMPRESSION: Minimal left basilar subsegmental atelectasis or scarring. Electronically Signed   By: Marijo Conception, M.D.   On: 05/04/2016 12:33   US Renal  Result Date: 05/04/2016 CLINICAL DATA:  Acute renal failure.  Bladder cancer. EXAM: RENAL / URINARY TRACT ULTRASOUND COMPLETE COMPARISON:  Ultrasound dated 03/04/2016 and CT scan dated 01/20/2016 FINDINGS: Right Kidney: Length: 10.2 cm. Slight increased echogenicity of the renal parenchyma. 1 cm slightly exophytic cyst on the medial aspect of the upper pole. No hydronephrosis. Left Kidney: Length: 10.1 cm. Increased echogenicity of the renal  parenchyma, unchanged. 2.2 cm complex cyst in the mid left kidney. This has diminished  in size since the prior ultrasound. Recurrent moderate left hydronephrosis, new since the prior ultrasound. Stent in the renal pelvis. Bladder: Irregular thickening of the bladder wall. Ureteral stent in place on the left. Right ureteral jet identified. IMPRESSION: 1. Recurrent left hydronephrosis even though there is a left ureteral stent in place. 2. Decreased size and increased complexity of face cystic lesion in the lateral aspect of the left kidney. 3. Again noted is diffuse thickening and irregularity of the bladder wall with consistent with the history of carcinoma of the bladder. Electronically Signed   By: Lorriane Shire M.D.   On: 05/04/2016 14:06    EKG:   Orders placed or performed during the hospital encounter of 05/04/16  . ED EKG within 10 minutes  . ED EKG within 10 minutes    ASSESSMENT AND PLAN:    1 chest pain nonspecific   Improved with tramadol  troponins  are negative.   #2 acute on chronic renal failure due to worsening recurrent left-sided hydronephrosis; Urology is not considering any procedures including changing stent at this time Follow-up with nephrology and urology Continue IV fluids Nothing by mouth after midnight for possible procedures in a.m. if needed Continue Foley catheter for bladder decompression  For bladder cancer continue chemotherapy, radiation as appropriate.  #3 acute on chronic renal failure with chronic kidney disease stage IV: Baseline creatinine 2.3 and GFR 23 Continue IV fluids. Appreciate nephrology recommendations Patient had a ureteral stent on the left side, , monitor urine output.    #4 acute on chronic anemia (hemoglobin 8.7 on October 5 and 7.8 today. Monitor closely hold off on transfusion at this time.  #Hyperkalemia small dose Kayexalate   Anorexia    All the records are reviewed and case discussed with Care Management/Social  Workerr. Management plans discussed with the patient, family and they are in agreement.  CODE STATUS: fc   TOTAL TIME TAKING CARE OF THIS PATIENT: 36 minutes.   POSSIBLE D/C IN  2 DAYS, DEPENDING ON CLINICAL CONDITION.  Note: This dictation was prepared with Dragon dictation along with smaller phrase technology. Any transcriptional errors that result from this process are unintentional.   Nicholes Mango M.D on 05/05/2016 at 4:29 PM  Between 7am to 6pm - Pager - [phone] After 6pm go to [email or web address] - password [email or web address] [facility]  Office  [phone]  CC: Primary care physician; Madelyn Brunner, MD

## 2016-05-05 NOTE — Care Management (Addendum)
Admitted to [facility] with the diagnosis of acute on chronic renal failure. Lives with husband, Jeneen Rinks x 13 years. [phone]). Last seen at the [facility] per Dr. Rogue Bussing 04/23/16. Takes care of all basic activities of daily living herself, drives. Prescriptions are filled at [facility] in [place]. Uses a cane and rolling walker to aid in ambulation. Home health through [facility]? No skilled facility. Home oxygen at night only per [employer]. No falls. Fair appetite. Last discharged from this facility 04/17/16. [facility] was arranged at that time, but did not see Ms. Heitz due to [facility] services in the home. Updated per Floydene Flock at [facility]. They are not following Ms. Vonseggern anymore. Shelbie Ammons RN MSN CCM Care Management [phone]

## 2016-05-05 NOTE — Progress Notes (Signed)
Initial Nutrition Assessment  DOCUMENTATION CODES:   Severe malnutrition in context of chronic illness  INTERVENTION:  Encouraged adequate intake of protein and calories through small, frequent meals and beverages. Reviewed protein options patient willing to eat (yogurt, milk).  Recommend providing chopped meats for patient as difficulty chewing was the only reason she provided for not ordering meat.  Will order Chocolate Carnation Instant Breakfast with 2% milk po BID between meals, each supplement provides 252 kcal and 13 grams protein.   NUTRITION DIAGNOSIS:   Inadequate oral intake related to poor appetite, other (see comment) (difficulty chewing and swallowing) as evidenced by per patient/family report.  GOAL:   Patient will meet greater than or equal to 90% of their needs  MONITOR:   PO intake, Supplement acceptance, Labs, Weight trends, I & O's  REASON FOR ASSESSMENT:   Malnutrition Screening Tool    ASSESSMENT:   75 year old female with history of bladder cancer on radiation therapy and chemotherapy, acute on chronic renal failure (CKD Stage IV) admitted because of chest pain. Recent admission in September 2017 for sepsis, UTI, seizure.   -Last radiation therapy on 04/29/2016. -Received second dose of gemcitabine on 04/23/2016.  Patient's husband in room at time of assessment. Patient reports has been poor for "quite a while" but she does report it is slowly improving. She is experiencing early satiety. Also endorses difficulty chewing meat and also difficulty swallowing sometimes. Denies N/V or abdominal pain. Patient reports she used to eat a lot of food and her intake has been decreased for a few months. She was not able to provide details on intake, but reports she mainly eats fruit. She likes milk. She is not currently taking any oral nutrition supplements at home and does not want any. She is amenable to adding [employer] to milk.   Patient  reports UBW was 217-220 lbs and that she has lost a significant amount of weight. Per chart, it appears patient has lost 56 lbs (25% body weight) in the past year (patient weighed 227 lbs 04/2015), which is significant for time frame.   Medications reviewed and include: Vitamin D 1000 units daily, Colace, Marinol 2.5 mg BID before lunch and dinner, pantoprazole, NS @ 75 ml/hr.  Labs reviewed: CBG 88, Sodium 134, Potassium 5.2, CO2 15, BUN 71, Creatinine 5.75.  Urine Output: 900 ml past 24 hours  Nutrition-Focused physical exam completed. Findings are no fat depletion, mild muscle depletion, and no edema.   Patient meets criteria for severe chronic malnutrition in setting of 25% weight loss over 1 year, intake </75% estimated energy requirement for >/= 1 month.  Discussed plan with RN.   Diet Order:  Diet NPO time specified Diet 2 gram sodium Room service appropriate? Yes; Fluid consistency: Thin  Skin:  Reviewed, no issues  Last BM:  10/17  Height:   Ht Readings from Last 1 Encounters:  05/04/16 5' 8"  (1.727 m)    Weight:   Wt Readings from Last 1 Encounters:  05/05/16 171 lb 14.4 oz (78 kg)    Ideal Body Weight:  63.64 kg  BMI:  Body mass index is 26.14 kg/m.  Estimated Nutritional Needs:   Kcal:  1950-2340 (25-30 kcal/kg)  Protein:  78-94 grams (1-1.2 grams/kg)  Fluid:  >/= 1.9 L/day or per MD  EDUCATION NEEDS:   Education needs addressed (Increased needs for protein and calories. Encouraged small, frequent meals. )  Willey Blade, MS, RD, LDN Pager: [phone] After Hours Pager: [phone]

## 2016-05-06 ENCOUNTER — Ambulatory Visit: Payer: Medicare Other

## 2016-05-06 DIAGNOSIS — N179 Acute kidney failure, unspecified: Principal | ICD-10-CM

## 2016-05-06 LAB — BASIC METABOLIC PANEL
ANION GAP: 7 (ref 5–15)
BUN: 65 mg/dL — ABNORMAL HIGH (ref 6–20)
CALCIUM: 8.2 mg/dL — AB (ref 8.9–10.3)
CO2: 15 mmol/L — ABNORMAL LOW (ref 22–32)
CREATININE: 4.92 mg/dL — AB (ref 0.44–1.00)
Chloride: 113 mmol/L — ABNORMAL HIGH (ref 101–111)
GFR, EST AFRICAN AMERICAN: 9 mL/min — AB (ref >60)
GFR, EST NON AFRICAN AMERICAN: 8 mL/min — AB (ref >60)
Glucose, Bld: 100 mg/dL — ABNORMAL HIGH (ref 65–99)
Potassium: 4.9 mmol/L (ref 3.5–5.1)
SODIUM: 135 mmol/L (ref 135–145)

## 2016-05-06 LAB — CBC
HCT: 21.3 % — ABNORMAL LOW (ref 35.0–47.0)
HEMATOCRIT: 21.4 % — AB (ref 35.0–47.0)
HEMOGLOBIN: 6.8 g/dL — AB (ref 12.0–16.0)
Hemoglobin: 7 g/dL — ABNORMAL LOW (ref 12.0–16.0)
MCH: 26.7 pg (ref 26.0–34.0)
MCH: 27.2 pg (ref 26.0–34.0)
MCHC: 32 g/dL (ref 32.0–36.0)
MCHC: 32.8 g/dL (ref 32.0–36.0)
MCV: 83.4 fL (ref 80.0–100.0)
MCV: 82.9 fL (ref 80.0–100.0)
PLATELETS: 140 10*3/uL — AB (ref 150–440)
Platelets: 126 10*3/uL — ABNORMAL LOW (ref 150–440)
RBC: 2.55 MIL/uL — ABNORMAL LOW (ref 3.80–5.20)
RBC: 2.58 MIL/uL — ABNORMAL LOW (ref 3.80–5.20)
RDW: 17.1 % — ABNORMAL HIGH (ref 11.5–14.5)
RDW: 17.1 % — AB (ref 11.5–14.5)
WBC: 6 10*3/uL (ref 3.6–11.0)
WBC: 6.6 10*3/uL (ref 3.6–11.0)

## 2016-05-06 LAB — GLUCOSE, CAPILLARY: Glucose-Capillary: 103 mg/dL — ABNORMAL HIGH (ref 65–99)

## 2016-05-06 MED ORDER — TRAMADOL HCL 50 MG PO TABS
50.0000 mg | ORAL_TABLET | Freq: Two times a day (BID) | ORAL | Status: DC
  Administered 2016-05-07 – 2016-05-09 (×3): 50 mg via ORAL
  Filled 2016-05-06 (×6): qty 1

## 2016-05-06 MED ORDER — ACETAMINOPHEN 325 MG PO TABS: 650.0000 mg | ORAL_TABLET | Freq: Four times a day (QID) | ORAL | Status: DC | PRN

## 2016-05-06 NOTE — Progress Notes (Signed)
Urology Consult Follow Up  Subjective: Patient found resting comfortably in bed.  No complaints at this time.  Serum creatinine improving.  UOP good with pink tinged urine.  Anti-infectives: Anti-infectives    None      Current Facility-Administered Medications  Medication Dose Route Frequency Provider Last Rate Last Dose  . 0.9 %  sodium chloride infusion   Intravenous Continuous Epifanio Lesches, MD 75 mL/hr at 05/05/16 1941    . albuterol (PROVENTIL) (2.5 MG/3ML) 0.083% nebulizer solution 2.5 mg  2.5 mg Nebulization QID PRN Epifanio Lesches, MD      . atorvastatin (LIPITOR) tablet 40 mg  40 mg Oral q1800 Epifanio Lesches, MD   40 mg at 05/05/16 1817  . bisacodyl (DULCOLAX) EC tablet 5 mg  5 mg Oral Daily PRN Epifanio Lesches, MD      . cholecalciferol (VITAMIN D) tablet 1,000 Units  1,000 Units Oral q morning - 10a Epifanio Lesches, MD   1,000 Units at 05/05/16 1107  . docusate sodium (COLACE) capsule 100 mg  100 mg Oral BID Epifanio Lesches, MD   100 mg at 05/05/16 1107  . gabapentin (NEURONTIN) capsule 100 mg  100 mg Oral BID Nicholes Mango, MD   100 mg at 05/05/16 2200  . heparin injection 5,000 Units  5,000 Units Subcutaneous Q12H Epifanio Lesches, MD   5,000 Units at 05/05/16 0010  . mirabegron ER (MYRBETRIQ) tablet 25 mg  25 mg Oral Daily Epifanio Lesches, MD   25 mg at 05/05/16 1106  . ondansetron (ZOFRAN) tablet 4 mg  4 mg Oral Q6H PRN Epifanio Lesches, MD       Or  . ondansetron (ZOFRAN) injection 4 mg  4 mg Intravenous Q6H PRN Epifanio Lesches, MD      . opium-belladonna (B&O SUPPRETTES) 16.2-60 MG suppository 1 suppository  1 suppository Rectal Q8H PRN Ardis Hughs, MD   1 suppository at 05/05/16 0808  . pantoprazole (PROTONIX) EC tablet 40 mg  40 mg Oral Daily Epifanio Lesches, MD   40 mg at 05/05/16 1318  . traMADol (ULTRAM) tablet 50 mg  50 mg Oral Q8H PRN Nicholes Mango, MD   50 mg at 05/05/16 1107     Objective: Vital signs in last  24 hours: Temp:  [97.5 F (36.4 C)-98.5 F (36.9 C)] 98.5 F (36.9 C) (10/17 2120) Pulse Rate:  [83-85] 83 (10/18 0350) Resp:  [16-18] 18 (10/17 2120) BP: (120-145)/(55-62) 127/62 (10/18 0350) SpO2:  [98 %-100 %] 99 % (10/18 0350) Weight:  [173 lb (78.5 kg)] 173 lb (78.5 kg) (10/18 0424)  Intake/Output from previous day: 10/17 0701 - 10/18 0700 In: 2087.5 [P.O.:240; I.V.:1847.5] Out: 1525 [Urine:1525] Intake/Output this shift: No intake/output data recorded.   Physical Exam Constitutional: Well nourished. Alert and oriented, No acute distress. HEENT: [place] AT, moist mucus membranes. Trachea midline, no masses. Cardiovascular: No clubbing, cyanosis, or edema. Respiratory: Normal respiratory effort, no increased work of breathing. GI: Abdomen is soft, non tender, non distended, no abdominal masses. Liver and spleen not palpable.  No hernias appreciated.  Stool sample for occult testing is not indicated.   GU: No CVA tenderness.  No bladder fullness or masses.   Skin: No rashes, bruises or suspicious lesions. Lymph: No cervical or inguinal adenopathy. Neurologic: Grossly intact, no focal deficits, moving all 4 extremities. Psychiatric: Normal mood and affect.  Lab Results:   Recent Labs  05/06/16 0200 05/06/16 0615  WBC 6.0 6.6  HGB 6.8* 7.0*  HCT 21.3* 21.4*  PLT 126*  140*   BMET  Recent Labs  05/05/16 0500 05/06/16 0615  NA 134* 135  K 5.2* 4.9  CL 109 113*  CO2 15* 15*  GLUCOSE 96 100*  BUN 71* 65*  CREATININE 5.75* 4.92*  CALCIUM 8.4* 8.2*   PT/INR No results for input(s): LABPROT, INR in the last 72 hours. ABG No results for input(s): PHART, HCO3 in the last 72 hours.  Invalid input(s): PCO2, PO2  Studies/Results: Dg Chest 2 View  Result Date: 05/04/2016 CLINICAL DATA:  Chest pain. EXAM: CHEST  2 VIEW COMPARISON:  Radiograph of April 17, 2016. FINDINGS: The heart size and mediastinal contours are within normal limits. No pneumothorax or pleural  effusion is noted. Stable position of right internal jugular Port-A-Cath. Minimal left basilar subsegmental atelectasis or scarring is noted. Right lung is clear. The visualized skeletal structures are unremarkable. IMPRESSION: Minimal left basilar subsegmental atelectasis or scarring. Electronically Signed   By: Marijo Conception, M.D.   On: 05/04/2016 12:33   US Renal  Result Date: 05/04/2016 CLINICAL DATA:  Acute renal failure.  Bladder cancer. EXAM: RENAL / URINARY TRACT ULTRASOUND COMPLETE COMPARISON:  Ultrasound dated 03/04/2016 and CT scan dated 01/20/2016 FINDINGS: Right Kidney: Length: 10.2 cm. Slight increased echogenicity of the renal parenchyma. 1 cm slightly exophytic cyst on the medial aspect of the upper pole. No hydronephrosis. Left Kidney: Length: 10.1 cm. Increased echogenicity of the renal parenchyma, unchanged. 2.2 cm complex cyst in the mid left kidney. This has diminished in size since the prior ultrasound. Recurrent moderate left hydronephrosis, new since the prior ultrasound. Stent in the renal pelvis. Bladder: Irregular thickening of the bladder wall. Ureteral stent in place on the left. Right ureteral jet identified. IMPRESSION: 1. Recurrent left hydronephrosis even though there is a left ureteral stent in place. 2. Decreased size and increased complexity of face cystic lesion in the lateral aspect of the left kidney. 3. Again noted is diffuse thickening and irregularity of the bladder wall with consistent with the history of carcinoma of the bladder. Electronically Signed   By: Lorriane Shire M.D.   On: 05/04/2016 14:06   Assessment: 54 old female with multiple medical co morbidities and muscle invasive bladder cancer being managed currently undergoing chemotherapy/radiation as not a cystectomy candidate admitted with chest pain, deconditioning and renal failure.  She does have a history of left distal ureteral obstruction from her tumor currently with a [employer] indwelling  stent.  Slight interval increase in hydronephrosis on renal ultrasound yesterday with a full bladder.  Today, creatinine slowly trending downward with hydration. 4.92 down from 5.75 yesterday.   Suspect renal failure multifactorial.    Plan: -No plan for stent exchange today given downward trending creatinine  -Advanced per primary team  -Maintain Foley catheter for maximal bladder and kidney decompression  -Advance diet as tolerated     LOS: 2 days    [facility] 05/06/2016

## 2016-05-06 NOTE — Progress Notes (Signed)
[facility]  ROUNDING NOTE   Subjective:  Renal function appears to be improving slowly. Creatinine down to 4.92. Urine output was 1.5 L over the preceding 24 hours.   Objective:  Vital signs in last 24 hours:  Temp:  [97.5 F (36.4 C)-98.5 F (36.9 C)] 98.1 F (36.7 C) (10/18 0824) Pulse Rate:  [79-85] 79 (10/18 0824) Resp:  [16-18] 18 (10/17 2120) BP: (115-145)/(47-62) 115/47 (10/18 0824) SpO2:  [96 %-100 %] 96 % (10/18 0824) Weight:  [78.5 kg (173 lb)] 78.5 kg (173 lb) (10/18 0424)  Weight change: 1.361 kg (3 lb) Filed Weights   05/04/16 1625 05/05/16 0425 05/06/16 0424  Weight: 74.5 kg (164 lb 3.2 oz) 78 kg (171 lb 14.4 oz) 78.5 kg (173 lb)    Intake/Output: I/O last 3 completed shifts: In: 3082.5 [P.O.:290; I.V.:2792.5] Out: 2425 [Urine:2425]   Intake/Output this shift:  Total I/O In: -  Out: 400 [Urine:400]  Physical Exam: General: No acute distress  Head: Normocephalic, atraumatic. Moist oral mucosal membranes  Eyes: Anicteric  Neck: Supple, trachea midline  Lungs:  Clear to auscultation, normal effort  Heart: S1S2 no rubs  Abdomen:  Soft, nontender, BS present  Extremities: no peripheral edema.  Neurologic: Nonfocal, moving all four extremities  Skin: No lesions  GU: Foley catheter in place, mild hematuria noted    Basic Metabolic Panel:  Recent Labs Lab 05/04/16 1109 05/05/16 0500 05/06/16 0615  NA 133* 134* 135  K 4.8 5.2* 4.9  CL 104 109 113*  CO2 16* 15* 15*  GLUCOSE 148* 96 100*  BUN 72* 71* 65*  CREATININE 6.48* 5.75* 4.92*  CALCIUM 8.7* 8.4* 8.2*    Liver Function Tests: No results for input(s): AST, ALT, ALKPHOS, BILITOT, PROT, ALBUMIN in the last 168 hours. No results for input(s): LIPASE, AMYLASE in the last 168 hours. No results for input(s): AMMONIA in the last 168 hours.  CBC:  Recent Labs Lab 05/04/16 1109 05/05/16 0500 05/05/16 1314 05/05/16 1915 05/06/16 0200 05/06/16 0615  WBC 6.9 7.1  --   --   6.0 6.6  HGB 7.8* 7.4* 7.4* 7.2* 6.8* 7.0*  HCT 23.8* 22.4* 22.9* 22.5* 21.3* 21.4*  MCV 82.8 82.4  --   --  83.4 82.9  PLT 151 135*  --   --  126* 140*    Cardiac Enzymes:  Recent Labs Lab 05/04/16 1109 05/04/16 1753  TROPONINI <0.03 <0.03    BNP: Invalid input(s): POCBNP  CBG:  Recent Labs Lab 05/05/16 0743 05/06/16 0813  GLUCAP 75 103*    Microbiology: Results for orders placed or performed in visit on 04/23/16  Urine culture     Status: None   Collection Time: 04/23/16 11:38 AM  Result Value Ref Range Status   Specimen Description URINE, CLEAN CATCH  Final   Special Requests NONE  Final   Culture NO GROWTH Performed at [facility]   Final   Report Status 04/24/2016 FINAL  Final    Coagulation Studies: No results for input(s): LABPROT, INR in the last 72 hours.  Urinalysis:  Recent Labs  05/04/16 1109  COLORURINE RED*  LABSPEC 1.012  PHURINE 7.0  GLUCOSEU 50*  HGBUR 3+*  BILIRUBINUR NEGATIVE  KETONESUR NEGATIVE  PROTEINUR 100*  NITRITE NEGATIVE  LEUKOCYTESUR 3+*      Imaging: Dg Chest 2 View  Result Date: 05/04/2016 CLINICAL DATA:  Chest pain. EXAM: CHEST  2 VIEW COMPARISON:  Radiograph of April 17, 2016. FINDINGS: The heart size and mediastinal  contours are within normal limits. No pneumothorax or pleural effusion is noted. Stable position of right internal jugular Port-A-Cath. Minimal left basilar subsegmental atelectasis or scarring is noted. Right lung is clear. The visualized skeletal structures are unremarkable. IMPRESSION: Minimal left basilar subsegmental atelectasis or scarring. Electronically Signed   By: Marijo Conception, M.D.   On: 05/04/2016 12:33   US Renal  Result Date: 05/04/2016 CLINICAL DATA:  Acute renal failure.  Bladder cancer. EXAM: RENAL / URINARY TRACT ULTRASOUND COMPLETE COMPARISON:  Ultrasound dated 03/04/2016 and CT scan dated 01/20/2016 FINDINGS: Right Kidney: Length: 10.2 cm. Slight increased  echogenicity of the renal parenchyma. 1 cm slightly exophytic cyst on the medial aspect of the upper pole. No hydronephrosis. Left Kidney: Length: 10.1 cm. Increased echogenicity of the renal parenchyma, unchanged. 2.2 cm complex cyst in the mid left kidney. This has diminished in size since the prior ultrasound. Recurrent moderate left hydronephrosis, new since the prior ultrasound. Stent in the renal pelvis. Bladder: Irregular thickening of the bladder wall. Ureteral stent in place on the left. Right ureteral jet identified. IMPRESSION: 1. Recurrent left hydronephrosis even though there is a left ureteral stent in place. 2. Decreased size and increased complexity of face cystic lesion in the lateral aspect of the left kidney. 3. Again noted is diffuse thickening and irregularity of the bladder wall with consistent with the history of carcinoma of the bladder. Electronically Signed   By: Lorriane Shire M.D.   On: 05/04/2016 14:06     Medications:   . sodium chloride 75 mL/hr at 05/05/16 1941   . atorvastatin  40 mg Oral q1800  . cholecalciferol  1,000 Units Oral q morning - 10a  . docusate sodium  100 mg Oral BID  . gabapentin  100 mg Oral BID  . heparin subcutaneous  5,000 Units Subcutaneous Q12H  . mirabegron ER  25 mg Oral Daily  . pantoprazole  40 mg Oral Daily   albuterol, bisacodyl, ondansetron **OR** ondansetron (ZOFRAN) IV, opium-belladonna, traMADol  Assessment/ Plan:  75 y.o. female with long-standing hypertension, diabetes type 2, hyperlipidemia, osteoarthritis, obstructive sleep apnea, bladder cancer 8/17 s/p TURBT and radiation therapy/chemo with gemcitabine.  1. Acute renal failure/chronic kidney disease stage IV/recurrent hydronephrosis.  Baseline creatinine 2.3 EGFR 23. Acute renal failure now could be related to recurrent hydronephrosis. -  Urology on the case. Current ureteral stent to remain in place and will not require exchange per urology.  Continue IV fluid hydration for  now.  2. Anemia of chronic kidney disease. Recommend continued monitoring of hemoglobin closely. Hemoglobin currently 7.0. Transfusion decision per hospitalist.  3. Hyponatremia. Sodium improved to 135. Continue to monitor. Continue 0.9 normal saline.   LOS: 2 Cybil [place] 10/18/20179:41 AM

## 2016-05-06 NOTE — Progress Notes (Signed)
[facility] NAME: Barbara [name]    MR#:  [id number]  DATE OF BIRTH:  01-24-1941  SUBJECTIVE:  CHIEF COMPLAINT:  Patient is feeling ok, no new complaintsl.  REVIEW OF SYSTEMS:  CONSTITUTIONAL: No fever, fatigue or weakness.  EYES: No blurred or double vision.  EARS, NOSE, AND THROAT: No tinnitus or ear pain.  RESPIRATORY: No cough, shortness of breath, wheezing or hemoptysis.  CARDIOVASCULAR: No chest pain, orthopnea, edema.  GASTROINTESTINAL: No nausea, vomiting, diarrhea or abdominal pain.  GENITOURINARY: No dysuria, hematuria.  ENDOCRINE: No polyuria, nocturia,  HEMATOLOGY: No anemia, easy bruising or bleeding SKIN: No rash or lesion. MUSCULOSKELETAL: No joint pain or arthritis.   NEUROLOGIC: No tingling, numbness, weakness.  PSYCHIATRY: No anxiety or depression.   DRUG ALLERGIES:   Allergies  Allergen Reactions  . Percocet [Oxycodone-Acetaminophen] Hives    VITALS:  Blood pressure (!) 121/54, pulse 78, temperature 98.1 F (36.7 C), temperature source Oral, resp. rate 18, height 5' 8"  (1.727 m), weight 78.5 kg (173 lb), SpO2 100 %.  PHYSICAL EXAMINATION:  GENERAL:  75 y.o.-year-old patient lying in the bed with no acute distress.  EYES: Pupils equal, round, reactive to light and accommodation. No scleral icterus. Extraocular muscles intact.  HEENT: Head atraumatic, normocephalic. Oropharynx and nasopharynx clear.  NECK:  Supple, no jugular venous distention. No thyroid enlargement, no tenderness.  LUNGS: Moderate breath sounds bilaterally, no wheezing, rales,rhonchi or crepitation. No use of accessory muscles of respiration.  CARDIOVASCULAR: S1, S2 normal. No murmurs, rubs, or gallops.  ABDOMEN: Soft, nontender, nondistended. Bowel sounds present. No organomegaly or mass.  EXTREMITIES: No pedal edema, cyanosis, or clubbing.  NEUROLOGIC: Cranial nerves II through XII are intact. Sensory intact gait not checked   PSYCHIATRIC: The patient is alert and oriented x 3.  SKIN: No obvious rash, lesion, or ulcer.    LABORATORY PANEL:   CBC  Recent Labs Lab 05/06/16 0615  WBC 6.6  HGB 7.0*  HCT 21.4*  PLT 140*   ------------------------------------------------------------------------------------------------------------------  Chemistries   Recent Labs Lab 05/06/16 0615  NA 135  K 4.9  CL 113*  CO2 15*  GLUCOSE 100*  BUN 65*  CREATININE 4.92*  CALCIUM 8.2*   ------------------------------------------------------------------------------------------------------------------  Cardiac Enzymes  Recent Labs Lab 05/04/16 1753  TROPONINI <0.03   ------------------------------------------------------------------------------------------------------------------  RADIOLOGY:  No results found.  EKG:   Orders placed or performed during the hospital encounter of 05/04/16  . ED EKG within 10 minutes  . ED EKG within 10 minutes    ASSESSMENT AND PLAN:    1 chest pain nonspecific   Improved with tramadol  troponins  are negative.   #2 acute on chronic renal failure due to worsening recurrent left-sided hydronephrosis; Urology is not considering any procedures including changing stent at this time Follow-up with nephrology and urology Continue IV fluids Continue Foley catheter for bladder decompression  For bladder cancer continue chemotherapy, radiation as appropriate.  #3 acute on chronic renal failure with chronic kidney disease stage IV: Baseline creatinine 2.3 and GFR 23 Cr 6.48 -5.75 - -4.92 Continue IV fluids. Appreciate nephrology recommendations Patient had a ureteral stent on the left side, , monitor urine output.    #4 acute on chronic anemia (hemoglobin 8.7 on October 5 and 7.8 --7.2 - 6.8-7.0  Monitor closely hold off on transfusion at this time.  #Hyperkalemia  Resolved , small dose Kayexalate given , pot - 4.9   Anorexia    All the  records are reviewed  and case discussed with Care Management/Social Workerr. Management plans discussed with the patient, family and they are in agreement.  CODE STATUS: fc   TOTAL TIME TAKING CARE OF THIS PATIENT: 36 minutes.   POSSIBLE D/C IN  2 DAYS, DEPENDING ON CLINICAL CONDITION.  Note: This dictation was prepared with Dragon dictation along with smaller phrase technology. Any transcriptional errors that result from this process are unintentional.   Nicholes Mango M.D on 05/06/2016 at 10:11 PM  Between 7am to 6pm - Pager - [phone] After 6pm go to [email or web address] - password [email or web address] [facility]  Office  [phone]  CC: Primary care physician; Madelyn Brunner, MD

## 2016-05-06 NOTE — Care Management (Signed)
[facility] in the past. Possibly will need [facility] pending progress and prognosis. Foley intact, but with some clots. Renal functions slightly improved today (BUN=65 Creat=4.92). Hgb 7.0 today Shelbie Ammons RN MSN [place] [facility] [phone]

## 2016-05-07 ENCOUNTER — Inpatient Hospital Stay: Payer: Medicare Other | Admitting: Internal Medicine

## 2016-05-07 ENCOUNTER — Inpatient Hospital Stay: Payer: Medicare Other

## 2016-05-07 ENCOUNTER — Ambulatory Visit: Payer: Medicare Other

## 2016-05-07 DIAGNOSIS — N1330 Unspecified hydronephrosis: Secondary | ICD-10-CM

## 2016-05-07 LAB — CBC
HEMATOCRIT: 20.3 % — AB (ref 35.0–47.0)
Hemoglobin: 6.5 g/dL — ABNORMAL LOW (ref 12.0–16.0)
MCH: 26.8 pg (ref 26.0–34.0)
MCHC: 32.3 g/dL (ref 32.0–36.0)
MCV: 82.9 fL (ref 80.0–100.0)
PLATELETS: 139 10*3/uL — AB (ref 150–440)
RBC: 2.44 MIL/uL — AB (ref 3.80–5.20)
RDW: 16.8 % — ABNORMAL HIGH (ref 11.5–14.5)
WBC: 5.9 10*3/uL (ref 3.6–11.0)

## 2016-05-07 LAB — BASIC METABOLIC PANEL
ANION GAP: 6 (ref 5–15)
BUN: 60 mg/dL — ABNORMAL HIGH (ref 6–20)
CO2: 15 mmol/L — AB (ref 22–32)
Calcium: 8.2 mg/dL — ABNORMAL LOW (ref 8.9–10.3)
Chloride: 118 mmol/L — ABNORMAL HIGH (ref 101–111)
Creatinine, Ser: 4.52 mg/dL — ABNORMAL HIGH (ref 0.44–1.00)
GFR, EST AFRICAN AMERICAN: 10 mL/min — AB (ref >60)
GFR, EST NON AFRICAN AMERICAN: 9 mL/min — AB (ref >60)
GLUCOSE: 84 mg/dL (ref 65–99)
POTASSIUM: 4.5 mmol/L (ref 3.5–5.1)
Sodium: 139 mmol/L (ref 135–145)

## 2016-05-07 LAB — PREPARE RBC (CROSSMATCH)

## 2016-05-07 LAB — GLUCOSE, CAPILLARY: Glucose-Capillary: 85 mg/dL (ref 65–99)

## 2016-05-07 MED ORDER — SODIUM CHLORIDE 0.9 % IV SOLN
Freq: Once | INTRAVENOUS | Status: AC
  Administered 2016-05-07: 18:00:00 via INTRAVENOUS

## 2016-05-07 NOTE — Progress Notes (Signed)
[facility]  ROUNDING NOTE   Subjective:  Renal function slowly improving. Creatinine down to 4.5 with a BUN of 60. Good urine output of 2 L over the preceding 24 hours. Reports that she ate breakfast and has been able to keep this down.   Objective:  Vital signs in last 24 hours:  Temp:  [98 F (36.7 C)-98.5 F (36.9 C)] 98.5 F (36.9 C) (10/19 0736) Pulse Rate:  [75-78] 75 (10/19 0736) Resp:  [18-19] 19 (10/19 0414) BP: (121-128)/(52-56) 128/52 (10/19 0736) SpO2:  [97 %-100 %] 99 % (10/19 0736) Weight:  [80.2 kg (176 lb 14.4 oz)] 80.2 kg (176 lb 14.4 oz) (10/19 0543)  Weight change: 1.769 kg (3 lb 14.4 oz) Filed Weights   05/05/16 0425 05/06/16 0424 05/07/16 0543  Weight: 78 kg (171 lb 14.4 oz) 78.5 kg (173 lb) 80.2 kg (176 lb 14.4 oz)    Intake/Output: I/O last 3 completed shifts: In: 5542.3 [P.O.:120; I.V.:5422.3] Out: 2650 [Urine:2650]   Intake/Output this shift:  Total I/O In: 394 [P.O.:240; Other:154] Out: 375 [Urine:375]  Physical Exam: General: No acute distress  Head: Normocephalic, atraumatic. Moist oral mucosal membranes  Eyes: Anicteric  Neck: Supple, trachea midline  Lungs:  Clear to auscultation, normal effort  Heart: S1S2 no rubs  Abdomen:  Soft, nontender, BS present  Extremities: no peripheral edema.  Neurologic: Nonfocal, moving all four extremities  Skin: No lesions  GU: Foley catheter in place, no hematuria now    Basic Metabolic Panel:  Recent Labs Lab 05/04/16 1109 05/05/16 0500 05/06/16 0615 05/07/16 0500  NA 133* 134* 135 139  K 4.8 5.2* 4.9 4.5  CL 104 109 113* 118*  CO2 16* 15* 15* 15*  GLUCOSE 148* 96 100* 84  BUN 72* 71* 65* 60*  CREATININE 6.48* 5.75* 4.92* 4.52*  CALCIUM 8.7* 8.4* 8.2* 8.2*    Liver Function Tests: No results for input(s): AST, ALT, ALKPHOS, BILITOT, PROT, ALBUMIN in the last 168 hours. No results for input(s): LIPASE, AMYLASE in the last 168 hours. No results for input(s): AMMONIA  in the last 168 hours.  CBC:  Recent Labs Lab 05/04/16 1109 05/05/16 0500 05/05/16 1314 05/05/16 1915 05/06/16 0200 05/06/16 0615 05/07/16 0500  WBC 6.9 7.1  --   --  6.0 6.6 5.9  HGB 7.8* 7.4* 7.4* 7.2* 6.8* 7.0* 6.5*  HCT 23.8* 22.4* 22.9* 22.5* 21.3* 21.4* 20.3*  MCV 82.8 82.4  --   --  83.4 82.9 82.9  PLT 151 135*  --   --  126* 140* 139*    Cardiac Enzymes:  Recent Labs Lab 05/04/16 1109 05/04/16 1753  TROPONINI <0.03 <0.03    BNP: Invalid input(s): POCBNP  CBG:  Recent Labs Lab 05/05/16 0743 05/06/16 0813 05/07/16 0809  GLUCAP 88 103* 1    Microbiology: Results for orders placed or performed in visit on 04/23/16  Urine culture     Status: None   Collection Time: 04/23/16 11:38 AM  Result Value Ref Range Status   Specimen Description URINE, CLEAN CATCH  Final   Special Requests NONE  Final   Culture NO GROWTH Performed at [facility]   Final   Report Status 04/24/2016 FINAL  Final    Coagulation Studies: No results for input(s): LABPROT, INR in the last 72 hours.  Urinalysis:  Recent Labs  05/04/16 1109  COLORURINE RED*  LABSPEC 1.012  PHURINE 7.0  GLUCOSEU 50*  HGBUR 3+*  BILIRUBINUR NEGATIVE  KETONESUR NEGATIVE  PROTEINUR 100*  NITRITE NEGATIVE  LEUKOCYTESUR 3+*      Imaging: US Renal  Result Date: 05/07/2016 CLINICAL DATA:  Recent left-sided hydronephrosis. History of urinary bladder carcinoma. Left double-J stent in place EXAM: RENAL / URINARY TRACT ULTRASOUND COMPLETE COMPARISON:  May 04, 2016 FINDINGS: Right Kidney: Length: 10.4 cm. Echogenicity is increased. Renal cortical thickness within normal limits. No perinephric fluid or hydronephrosis visualized. There is a 7 x 8 x 7 mm cyst in the upper pole kidney medially. No sonographically demonstrable calculus or ureterectasis. Left Kidney: Length: 10.2 cm. Echogenicity is increased. Renal cortical thickness is within normal limits. Hydronephrosis appears  essentially stable compared to recent prior study. Double-J stent is noted to be present on the left. The previously noted complex cystic structure in the mid left kidney persists, measuring 2.5 x 1.5 x 2.2 cm. No new mass. No perinephric fluid. No sonographically demonstrable calculus or ureterectasis evident. Bladder: Urinary bladder wall remains markedly thickened and somewhat irregular. IMPRESSION: Essentially no change from recent prior examination. Persistent fairly mild hydronephrosis on the left with double-J stent in place. Complex mass left kidney midportion, likely a complex cyst, stable. No new renal lesions evident. Irregular thickening of the urinary bladder wall remains. This finding is most likely due to the prior carcinoma of the urinary bladder. A degree of superimposed cystitis cannot be excluded by sonography. Electronically Signed   By: Lowella Grip III M.D.   On: 05/07/2016 09:40     Medications:   . sodium chloride 75 mL/hr at 05/07/16 0739   . atorvastatin  40 mg Oral q1800  . cholecalciferol  1,000 Units Oral q morning - 10a  . docusate sodium  100 mg Oral BID  . gabapentin  100 mg Oral BID  . mirabegron ER  25 mg Oral Daily  . pantoprazole  40 mg Oral Daily  . traMADol  50 mg Oral Q12H   acetaminophen, albuterol, bisacodyl, ondansetron **OR** ondansetron (ZOFRAN) IV, opium-belladonna  Assessment/ Plan:  75 y.o. female with long-standing hypertension, diabetes type 2, hyperlipidemia, osteoarthritis, obstructive sleep apnea, bladder cancer 8/17 s/p TURBT and radiation therapy/chemo with gemcitabine.  1. Acute renal failure/chronic kidney disease stage IV/recurrent hydronephrosis.  Baseline creatinine 2.3 EGFR 23. Acute renal failure now could be related to recurrent hydronephrosis. -  Renal function improving. Creatinine down to 4.5 with a BUN of 60. Good urine output noted as well. No indication for dialysis at the moment. Dr. Erlene Quan also following on the case to  determine if further intervention is needed.  2. Anemia of chronic kidney disease. Hemoglobin down to 6.5. Recommend transfusion of at least 1 unit PRBC. Defer decision to hospitalist.  3. Hyponatremia. Sodium corrected to 139. Continue to monitor.   LOS: 3 Breezy Hertenstein 10/19/201711:08 AM

## 2016-05-07 NOTE — Plan of Care (Signed)
Problem: Education: Goal: Knowledge of  General Education information/materials will improve Outcome: Progressing VSS, denies pain, nausea.  No acute issues overnight.  Foley draining yellow urine.  Call bell within reach, [place].

## 2016-05-07 NOTE — Progress Notes (Signed)
[facility] [place] NAME: Analiz Tvedt    MR#:  [id number]  DATE OF BIRTH:  06-26-1941  SUBJECTIVE:  CHIEF COMPLAINT:  Patient is feeling better, no new complaintsl.  REVIEW OF SYSTEMS:  CONSTITUTIONAL: No fever, fatigue or weakness.  EYES: No blurred or double vision.  EARS, NOSE, AND THROAT: No tinnitus or ear pain.  RESPIRATORY: No cough, shortness of breath, wheezing or hemoptysis.  CARDIOVASCULAR: No chest pain, orthopnea, edema.  GASTROINTESTINAL: No nausea, vomiting, diarrhea or abdominal pain.  GENITOURINARY: No dysuria, hematuria.  ENDOCRINE: No polyuria, nocturia,  HEMATOLOGY: No anemia, easy bruising or bleeding SKIN: No rash or lesion. MUSCULOSKELETAL: No joint pain or arthritis.   NEUROLOGIC: No tingling, numbness, weakness.  PSYCHIATRY: No anxiety or depression.   DRUG ALLERGIES:   Allergies  Allergen Reactions  . Percocet [Oxycodone-Acetaminophen] Hives    VITALS:  Blood pressure (!) 114/40, pulse 80, temperature 98.2 F (36.8 C), resp. rate 20, height 5' 8"  (1.727 m), weight 80.2 kg (176 lb 14.4 oz), SpO2 96 %.  PHYSICAL EXAMINATION:  GENERAL:  75 y.o.-year-old patient lying in the bed with no acute distress.  EYES: Pupils equal, round, reactive to light and accommodation. No scleral icterus. Extraocular muscles intact.  HEENT: Head atraumatic, normocephalic. Oropharynx and nasopharynx clear.  NECK:  Supple, no jugular venous distention. No thyroid enlargement, no tenderness.  LUNGS: Moderate breath sounds bilaterally, no wheezing, rales,rhonchi or crepitation. No use of accessory muscles of respiration.  CARDIOVASCULAR: S1, S2 normal. No murmurs, rubs, or gallops.  ABDOMEN: Soft, nontender, nondistended. Bowel sounds present. No organomegaly or mass.  EXTREMITIES: No pedal edema, cyanosis, or clubbing.  NEUROLOGIC: Cranial nerves II through XII are intact. Sensory intact gait not checked  PSYCHIATRIC: The  patient is alert and oriented x 3.  SKIN: No obvious rash, lesion, or ulcer.    LABORATORY PANEL:   CBC  Recent Labs Lab 05/07/16 0500  WBC 5.9  HGB 6.5*  HCT 20.3*  PLT 139*   ------------------------------------------------------------------------------------------------------------------  Chemistries   Recent Labs Lab 05/07/16 0500  NA 139  K 4.5  CL 118*  CO2 15*  GLUCOSE 84  BUN 60*  CREATININE 4.52*  CALCIUM 8.2*   ------------------------------------------------------------------------------------------------------------------  Cardiac Enzymes  Recent Labs Lab 05/04/16 1753  TROPONINI <0.03   ------------------------------------------------------------------------------------------------------------------  RADIOLOGY:  US Renal  Result Date: 05/07/2016 CLINICAL DATA:  Recent left-sided hydronephrosis. History of urinary bladder carcinoma. Left double-J stent in place EXAM: RENAL / URINARY TRACT ULTRASOUND COMPLETE COMPARISON:  May 04, 2016 FINDINGS: Right Kidney: Length: 10.4 cm. Echogenicity is increased. Renal cortical thickness within normal limits. No perinephric fluid or hydronephrosis visualized. There is a 7 x 8 x 7 mm cyst in the upper pole kidney medially. No sonographically demonstrable calculus or ureterectasis. Left Kidney: Length: 10.2 cm. Echogenicity is increased. Renal cortical thickness is within normal limits. Hydronephrosis appears essentially stable compared to recent prior study. Double-J stent is noted to be present on the left. The previously noted complex cystic structure in the mid left kidney persists, measuring 2.5 x 1.5 x 2.2 cm. No new mass. No perinephric fluid. No sonographically demonstrable calculus or ureterectasis evident. Bladder: Urinary bladder wall remains markedly thickened and somewhat irregular. IMPRESSION: Essentially no change from recent prior examination. Persistent fairly mild hydronephrosis on the left with  double-J stent in place. Complex mass left kidney midportion, likely a complex cyst, stable. No new renal lesions evident. Irregular thickening of the urinary bladder wall  remains. This finding is most likely due to the prior carcinoma of the urinary bladder. A degree of superimposed cystitis cannot be excluded by sonography. Electronically Signed   By: Lowella Grip III M.D.   On: 05/07/2016 09:40    EKG:   Orders placed or performed during the hospital encounter of 05/04/16  . ED EKG within 10 minutes  . ED EKG within 10 minutes    ASSESSMENT AND PLAN:   # acute on chronic renal failure -5 due to worsening recurrent left-sided hydronephrosis; Slow progression clinically status post ureteral stent on the left side  Baseline creatinine 2.3 and GFR 23 Cr 6.48 -5.75 - -4.92-4.52 Repeat renal ultrasound with no significant changes Urology is not considering any procedures including changing stent at this time Follow-up with nephrology and urology Continue IV fluids Continue Foley catheter for bladder decompression  For bladder cancer continue chemotherapy, radiation as appropriate.  # chest pain nonspecific   Improved with tramadol  troponins  are negative.    #4 acute on chronic anemia (hemoglobin 8.7 on October 5 and 7.8 --7.2 - 6.8-7.0-6.5  Transfuse 1 unit blood today  #Hyperkalemia  Resolved , small dose Kayexalate given , pot - 4.9   Anorexia    All the records are reviewed and case discussed with Care Management/Social Workerr. Management plans discussed with the patient, family and they are in agreement.  CODE STATUS: fc   TOTAL TIME TAKING CARE OF THIS PATIENT: 36 minutes.   POSSIBLE D/C IN  2 DAYS, DEPENDING ON CLINICAL CONDITION.  Note: This dictation was prepared with Dragon dictation along with smaller phrase technology. Any transcriptional errors that result from this process are unintentional.   Nicholes Mango M.D on 05/07/2016 at 1:34 PM  Between 7am  to 6pm - Pager - [phone] After 6pm go to [email or web address] - password [email or web address]  Office  [phone]  CC: Primary care physician; Madelyn Brunner, MD

## 2016-05-07 NOTE — Progress Notes (Signed)
Dr. Marcille Blanco paged re: Hgb 6.5

## 2016-05-07 NOTE — Progress Notes (Signed)
Urology Consult Follow Up  Subjective: Eating breakfast, overall doing better.  Serum creatinine improving mildy since yesterday.  UOP good with yellow urine.    Anti-infectives: Anti-infectives    None      Current Facility-Administered Medications  Medication Dose Route Frequency Provider Last Rate Last Dose  . 0.9 %  sodium chloride infusion   Intravenous Continuous Epifanio Lesches, MD 75 mL/hr at 05/07/16 0739    . acetaminophen (TYLENOL) tablet 650 mg  650 mg Oral Q6H PRN Nicholes Mango, MD      . albuterol (PROVENTIL) (2.5 MG/3ML) 0.083% nebulizer solution 2.5 mg  2.5 mg Nebulization QID PRN Epifanio Lesches, MD      . atorvastatin (LIPITOR) tablet 40 mg  40 mg Oral q1800 Epifanio Lesches, MD   40 mg at 05/06/16 1637  . bisacodyl (DULCOLAX) EC tablet 5 mg  5 mg Oral Daily PRN Epifanio Lesches, MD      . cholecalciferol (VITAMIN D) tablet 1,000 Units  1,000 Units Oral q morning - 10a Epifanio Lesches, MD   1,000 Units at 05/07/16 0740  . docusate sodium (COLACE) capsule 100 mg  100 mg Oral BID Epifanio Lesches, MD   100 mg at 05/07/16 0740  . gabapentin (NEURONTIN) capsule 100 mg  100 mg Oral BID Nicholes Mango, MD   100 mg at 05/07/16 0740  . mirabegron ER (MYRBETRIQ) tablet 25 mg  25 mg Oral Daily Epifanio Lesches, MD   25 mg at 05/07/16 0740  . ondansetron (ZOFRAN) tablet 4 mg  4 mg Oral Q6H PRN Epifanio Lesches, MD       Or  . ondansetron (ZOFRAN) injection 4 mg  4 mg Intravenous Q6H PRN Epifanio Lesches, MD      . opium-belladonna (B&O SUPPRETTES) 16.2-60 MG suppository 1 suppository  1 suppository Rectal Q8H PRN Ardis Hughs, MD   1 suppository at 05/07/16 0709  . pantoprazole (PROTONIX) EC tablet 40 mg  40 mg Oral Daily Epifanio Lesches, MD   40 mg at 05/07/16 0739  . traMADol (ULTRAM) tablet 50 mg  50 mg Oral Q12H Nicholes Mango, MD         Objective: Vital signs in last 24 hours: Temp:  [98 F (36.7 C)-98.5 F (36.9 C)] 98.5 F (36.9 C)  (10/19 0736) Pulse Rate:  [75-78] 75 (10/19 0736) Resp:  [18-19] 19 (10/19 0414) BP: (121-128)/(52-56) 128/52 (10/19 0736) SpO2:  [97 %-100 %] 99 % (10/19 0736) Weight:  [176 lb 14.4 oz (80.2 kg)] 176 lb 14.4 oz (80.2 kg) (10/19 0543)  Intake/Output from previous day: 10/18 0701 - 10/19 0700 In: 4631 [P.O.:120; I.V.:4511] Out: 2025 [Urine:2025] Intake/Output this shift: No intake/output data recorded.   Physical Exam Constitutional: Well nourished. Alert and oriented, No acute distress. HEENT: [place] AT, moist mucus membranes. Trachea midline, no masses. Cardiovascular: No clubbing, cyanosis, or edema. Respiratory: Normal respiratory effort, no increased work of breathing. GI: Abdomen is soft, non tender, non distended. GU: No CVA tenderness.  No bladder fullness or masses.   Foley draining clear urine.   Skin: No rashes, bruises or suspicious lesions. Neurologic: Grossly intact, no focal deficits, moving all 4 extremities. Psychiatric: Normal mood and affect.  Lab Results:   Recent Labs  05/06/16 0615 05/07/16 0500  WBC 6.6 5.9  HGB 7.0* 6.5*  HCT 21.4* 20.3*  PLT 140* 139*   BMET  Recent Labs  05/06/16 0615 05/07/16 0500  NA 135 139  K 4.9 4.5  CL 113* 118*  CO2 15*  15*  GLUCOSE 100* 84  BUN 65* 60*  CREATININE 4.92* 4.52*  CALCIUM 8.2* 8.2*    Studies/Results: No results found. Assessment: 19 old female with multiple medical co morbidities and muscle invasive bladder cancer being managed currently undergoing chemotherapy/radiation as not a cystectomy candidate admitted with chest pain, deconditioning and renal failure.  She does have a history of left distal ureteral obstruction from her tumor currently with a Bard Optima indwelling stent.  Slight interval increase in hydronephrosis on admission with a full bladder.  Today, creatinine slowly trending downward with hydration today, 4.52  Suspect renal failure multifactorial.    Plan: -RUS today to  assess for degree of hydro with Foley in place, will determine need for intervention based on study -Maintain Foley catheter for maximal bladder and kidney decompression  -continue to trend labs   LOS: 3 days    Hollice Espy 05/07/2016

## 2016-05-08 ENCOUNTER — Ambulatory Visit: Payer: Medicare Other

## 2016-05-08 ENCOUNTER — Telehealth: Payer: Self-pay | Admitting: Urology

## 2016-05-08 DIAGNOSIS — R079 Chest pain, unspecified: Secondary | ICD-10-CM

## 2016-05-08 LAB — GLUCOSE, CAPILLARY
Glucose-Capillary: 87 mg/dL (ref 65–99)
Glucose-Capillary: 121 mg/dL — ABNORMAL HIGH (ref 65–99)

## 2016-05-08 LAB — CBC
HEMATOCRIT: 22.9 % — AB (ref 35.0–47.0)
Hemoglobin: 7.5 g/dL — ABNORMAL LOW (ref 12.0–16.0)
MCH: 27.5 pg (ref 26.0–34.0)
MCHC: 32.7 g/dL (ref 32.0–36.0)
MCV: 83.9 fL (ref 80.0–100.0)
Platelets: 172 10*3/uL (ref 150–440)
RBC: 2.72 MIL/uL — ABNORMAL LOW (ref 3.80–5.20)
RDW: 16.7 % — AB (ref 11.5–14.5)
WBC: 8 10*3/uL (ref 3.6–11.0)

## 2016-05-08 LAB — BASIC METABOLIC PANEL
ANION GAP: 5 (ref 5–15)
BUN: 57 mg/dL — ABNORMAL HIGH (ref 6–20)
CHLORIDE: 118 mmol/L — AB (ref 101–111)
CO2: 15 mmol/L — AB (ref 22–32)
CREATININE: 4.53 mg/dL — AB (ref 0.44–1.00)
Calcium: 8.4 mg/dL — ABNORMAL LOW (ref 8.9–10.3)
GFR calc non Af Amer: 9 mL/min — ABNORMAL LOW (ref >60)
GFR, EST AFRICAN AMERICAN: 10 mL/min — AB (ref >60)
Glucose, Bld: 89 mg/dL (ref 65–99)
Potassium: 4.9 mmol/L (ref 3.5–5.1)
Sodium: 138 mmol/L (ref 135–145)

## 2016-05-08 LAB — TYPE AND SCREEN
ABO/RH(D): O POS
ANTIBODY SCREEN: NEGATIVE
UNIT DIVISION: 0

## 2016-05-08 MED ORDER — BELLADONNA ALKALOIDS-OPIUM 16.2-60 MG RE SUPP: 1.0000 | Freq: Four times a day (QID) | RECTAL | Status: DC | PRN

## 2016-05-08 NOTE — Progress Notes (Signed)
[facility]  ROUNDING NOTE   Subjective:  S Creatinine is about the same. UOP 1325 cc States she is able to eat. Appetite is getting better   Objective:  Vital signs in last 24 hours:  Temp:  [97.6 F (36.4 C)-99.2 F (37.3 C)] 99.2 F (37.3 C) (10/20 1405) Pulse Rate:  [78-85] 78 (10/20 1405) Resp:  [17-20] 18 (10/20 1405) BP: (120-142)/(48-66) 120/48 (10/20 1405) SpO2:  [95 %-98 %] 98 % (10/20 1405) Weight:  [80.5 kg (177 lb 6.4 oz)] 80.5 kg (177 lb 6.4 oz) (10/20 0556)  Weight change: 0.227 kg (8 oz) Filed Weights   05/06/16 0424 05/07/16 0543 05/08/16 0556  Weight: 78.5 kg (173 lb) 80.2 kg (176 lb 14.4 oz) 80.5 kg (177 lb 6.4 oz)    Intake/Output: I/O last 3 completed shifts: In: 7628 [P.O.:530; I.V.:4511; Blood:350; Other:154] Out: 2125 [Urine:2125]   Intake/Output this shift:  Total I/O In: -  Out: 850 [Urine:850]  Physical Exam: General: No acute distress  Head: Normocephalic, atraumatic. Moist oral mucosal membranes  Eyes: Anicteric  Neck: Supple, trachea midline  Lungs:  Clear to auscultation, normal effort  Heart: S1S2 no rubs  Abdomen:  Soft, nontender, BS present  Extremities: no peripheral edema.  Neurologic: Nonfocal, moving all four extremities  Skin: No lesions  GU: Foley catheter in place, no hematuria now    Basic Metabolic Panel:  Recent Labs Lab 05/04/16 1109 05/05/16 0500 05/06/16 0615 05/07/16 0500 05/08/16 0550  NA 133* 134* 135 139 138  K 4.8 5.2* 4.9 4.5 4.9  CL 104 109 113* 118* 118*  CO2 16* 15* 15* 15* 15*  GLUCOSE 148* 96 100* 84 89  BUN 72* 71* 65* 60* 57*  CREATININE 6.48* 5.75* 4.92* 4.52* 4.53*  CALCIUM 8.7* 8.4* 8.2* 8.2* 8.4*    Liver Function Tests: No results for input(s): AST, ALT, ALKPHOS, BILITOT, PROT, ALBUMIN in the last 168 hours. No results for input(s): LIPASE, AMYLASE in the last 168 hours. No results for input(s): AMMONIA in the last 168 hours.  CBC:  Recent Labs Lab  05/05/16 0500  05/05/16 1915 05/06/16 0200 05/06/16 0615 05/07/16 0500 05/08/16 0550  WBC 7.1  --   --  6.0 6.6 5.9 8.0  HGB 7.4*  < > 7.2* 6.8* 7.0* 6.5* 7.5*  HCT 22.4*  < > 22.5* 21.3* 21.4* 20.3* 22.9*  MCV 82.4  --   --  83.4 82.9 82.9 83.9  PLT 135*  --   --  126* 140* 139* 172  < > = values in this interval not displayed.  Cardiac Enzymes:  Recent Labs Lab 05/04/16 1109 05/04/16 1753  TROPONINI <0.03 <0.03    BNP: Invalid input(s): POCBNP  CBG:  Recent Labs Lab 05/05/16 0743 05/06/16 0813 05/07/16 0809 05/08/16 0720 05/08/16 1145  GLUCAP 88 103* 85 87 121*    Microbiology: Results for orders placed or performed in visit on 04/23/16  Urine culture     Status: None   Collection Time: 04/23/16 11:38 AM  Result Value Ref Range Status   Specimen Description URINE, CLEAN CATCH  Final   Special Requests NONE  Final   Culture NO GROWTH Performed at [facility]   Final   Report Status 04/24/2016 FINAL  Final    Coagulation Studies: No results for input(s): LABPROT, INR in the last 72 hours.  Urinalysis: No results for input(s): COLORURINE, LABSPEC, PHURINE, GLUCOSEU, HGBUR, BILIRUBINUR, KETONESUR, PROTEINUR, UROBILINOGEN, NITRITE, LEUKOCYTESUR in the last 72 hours.  Invalid  input(s): APPERANCEUR    Imaging: US Renal  Result Date: 05/07/2016 CLINICAL DATA:  Recent left-sided hydronephrosis. History of urinary bladder carcinoma. Left double-J stent in place EXAM: RENAL / URINARY TRACT ULTRASOUND COMPLETE COMPARISON:  May 04, 2016 FINDINGS: Right Kidney: Length: 10.4 cm. Echogenicity is increased. Renal cortical thickness within normal limits. No perinephric fluid or hydronephrosis visualized. There is a 7 x 8 x 7 mm cyst in the upper pole kidney medially. No sonographically demonstrable calculus or ureterectasis. Left Kidney: Length: 10.2 cm. Echogenicity is increased. Renal cortical thickness is within normal limits. Hydronephrosis appears  essentially stable compared to recent prior study. Double-J stent is noted to be present on the left. The previously noted complex cystic structure in the mid left kidney persists, measuring 2.5 x 1.5 x 2.2 cm. No new mass. No perinephric fluid. No sonographically demonstrable calculus or ureterectasis evident. Bladder: Urinary bladder wall remains markedly thickened and somewhat irregular. IMPRESSION: Essentially no change from recent prior examination. Persistent fairly mild hydronephrosis on the left with double-J stent in place. Complex mass left kidney midportion, likely a complex cyst, stable. No new renal lesions evident. Irregular thickening of the urinary bladder wall remains. This finding is most likely due to the prior carcinoma of the urinary bladder. A degree of superimposed cystitis cannot be excluded by sonography. Electronically Signed   By: Lowella Grip III M.D.   On: 05/07/2016 09:40     Medications:     . atorvastatin  40 mg Oral q1800  . cholecalciferol  1,000 Units Oral q morning - 10a  . docusate sodium  100 mg Oral BID  . gabapentin  100 mg Oral BID  . mirabegron ER  25 mg Oral Daily  . pantoprazole  40 mg Oral Daily  . traMADol  50 mg Oral Q12H   acetaminophen, albuterol, bisacodyl, ondansetron **OR** ondansetron (ZOFRAN) IV, opium-belladonna  Assessment/ Plan:  75 y.o. female with long-standing hypertension, diabetes type 2, hyperlipidemia, osteoarthritis, obstructive sleep apnea, bladder cancer 8/17 s/p TURBT and radiation therapy/chemo with gemcitabine.  1. Acute renal failure/chronic kidney disease stage IV/recurrent hydronephrosis.  Baseline creatinine 2.3 EGFR 23. Acute renal failure now could be related to recurrent hydronephrosis. - Creatinine down to 4.5 with a BUN of 57.  - Fair urine output noted as well. No indication for dialysis at the moment.  - Dr. Erlene Quan also following on the case to determine if further intervention is needed.  2. Anemia of  chronic kidney disease. Hemoglobin down to 7.5   - transfusion as per hospitalist   3. Hyponatremia. Sodium corrected to 138. Continue to monitor.   LOS: 4 Jaxsyn Catalfamo 10/20/20175:30 PM

## 2016-05-08 NOTE — Telephone Encounter (Signed)
Plan to arrange outpatient ureteral stent exchange, possible TURBT for Ms. Howald on Tuesday.  PReop abx ancef 2 g IV.    Hollice Espy, MD

## 2016-05-08 NOTE — Progress Notes (Signed)
Urology Consult Follow Up  Subjective: Sleeping this a.m. Received 1 unit of packed red blood cells overnight. Creatinine is stabilized. No further hematuria.  Anti-infectives: Anti-infectives    None      Current Facility-Administered Medications  Medication Dose Route Frequency Provider Last Rate Last Dose  . acetaminophen (TYLENOL) tablet 650 mg  650 mg Oral Q6H PRN Nicholes Mango, MD      . albuterol (PROVENTIL) (2.5 MG/3ML) 0.083% nebulizer solution 2.5 mg  2.5 mg Nebulization QID PRN Epifanio Lesches, MD      . atorvastatin (LIPITOR) tablet 40 mg  40 mg Oral q1800 Epifanio Lesches, MD   40 mg at 05/07/16 1818  . bisacodyl (DULCOLAX) EC tablet 5 mg  5 mg Oral Daily PRN Epifanio Lesches, MD      . cholecalciferol (VITAMIN D) tablet 1,000 Units  1,000 Units Oral q morning - 10a Epifanio Lesches, MD   1,000 Units at 05/08/16 1007  . docusate sodium (COLACE) capsule 100 mg  100 mg Oral BID Epifanio Lesches, MD   100 mg at 05/08/16 1007  . gabapentin (NEURONTIN) capsule 100 mg  100 mg Oral BID Nicholes Mango, MD   100 mg at 05/08/16 1007  . mirabegron ER (MYRBETRIQ) tablet 25 mg  25 mg Oral Daily Epifanio Lesches, MD   25 mg at 05/08/16 1007  . ondansetron (ZOFRAN) tablet 4 mg  4 mg Oral Q6H PRN Epifanio Lesches, MD       Or  . ondansetron (ZOFRAN) injection 4 mg  4 mg Intravenous Q6H PRN Epifanio Lesches, MD      . opium-belladonna (B&O SUPPRETTES) 16.2-60 MG suppository 1 suppository  1 suppository Rectal Q6H PRN Nicholes Mango, MD      . pantoprazole (PROTONIX) EC tablet 40 mg  40 mg Oral Daily Epifanio Lesches, MD   40 mg at 05/08/16 1007  . traMADol (ULTRAM) tablet 50 mg  50 mg Oral Q12H Nicholes Mango, MD   50 mg at 05/08/16 1007     Objective: Vital signs in last 24 hours: Temp:  [97.6 F (36.4 C)-99.2 F (37.3 C)] 99.2 F (37.3 C) (10/20 1405) Pulse Rate:  [74-85] 78 (10/20 1405) Resp:  [17-20] 18 (10/20 1405) BP: (120-142)/(48-66) 120/48 (10/20  1405) SpO2:  [95 %-98 %] 98 % (10/20 1405) Weight:  [177 lb 6.4 oz (80.5 kg)] 177 lb 6.4 oz (80.5 kg) (10/20 0556)  Intake/Output from previous day: 10/19 0701 - 10/20 0700 In: 1034 [P.O.:530; Blood:350] Out: 1325 [Urine:1325] Intake/Output this shift: Total I/O In: -  Out: 500 [Urine:500]   Physical Exam Constitutional: Well nourished. Alert and oriented, No acute distress. HEENT: [place] AT, moist mucus membranes. Trachea midline, no masses. Cardiovascular: No clubbing, cyanosis, or edema. Respiratory: Normal respiratory effort, no increased work of breathing. GI: Abdomen is soft, non tender, non distended. GU: No CVA tenderness.  No bladder fullness or masses.   Foley draining clear urine with sediment.   Neurologic: Grossly intact, no focal deficits, moving all 4 extremities. Psychiatric: Normal mood and affect.  Lab Results:   Recent Labs  05/07/16 0500 05/08/16 0550  WBC 5.9 8.0  HGB 6.5* 7.5*  HCT 20.3* 22.9*  PLT 139* 172   BMET  Recent Labs  05/07/16 0500 05/08/16 0550  NA 139 138  K 4.5 4.9  CL 118* 118*  CO2 15* 15*  GLUCOSE 84 89  BUN 60* 57*  CREATININE 4.52* 4.53*  CALCIUM 8.2* 8.4*    Studies/Results: US Renal  Result Date:  05/07/2016 CLINICAL DATA:  Recent left-sided hydronephrosis. History of urinary bladder carcinoma. Left double-J stent in place EXAM: RENAL / URINARY TRACT ULTRASOUND COMPLETE COMPARISON:  May 04, 2016 FINDINGS: Right Kidney: Length: 10.4 cm. Echogenicity is increased. Renal cortical thickness within normal limits. No perinephric fluid or hydronephrosis visualized. There is a 7 x 8 x 7 mm cyst in the upper pole kidney medially. No sonographically demonstrable calculus or ureterectasis. Left Kidney: Length: 10.2 cm. Echogenicity is increased. Renal cortical thickness is within normal limits. Hydronephrosis appears essentially stable compared to recent prior study. Double-J stent is noted to be present on the left. The  previously noted complex cystic structure in the mid left kidney persists, measuring 2.5 x 1.5 x 2.2 cm. No new mass. No perinephric fluid. No sonographically demonstrable calculus or ureterectasis evident. Bladder: Urinary bladder wall remains markedly thickened and somewhat irregular. IMPRESSION: Essentially no change from recent prior examination. Persistent fairly mild hydronephrosis on the left with double-J stent in place. Complex mass left kidney midportion, likely a complex cyst, stable. No new renal lesions evident. Irregular thickening of the urinary bladder wall remains. This finding is most likely due to the prior carcinoma of the urinary bladder. A degree of superimposed cystitis cannot be excluded by sonography. Electronically Signed   By: Lowella Grip III M.D.   On: 05/07/2016 09:40   Assessment: 38 old female with multiple medical co morbidities and muscle invasive bladder cancer being managed currently undergoing chemotherapy/radiation as not a cystectomy candidate admitted with chest pain, deconditioning, anemia and renal failure.  She does have a history of left distal ureteral obstruction from her tumor currently with a Bard Optima indwelling stent.  Slight interval increase in hydronephrosis on admission with a full bladder.  Renal ultrasound has remained stable despite Foley catheterization.  Creatinine has stabilized at 4.5 without further improvement or worsening. Good urine output. Clinically improved.  Plan: -Will arrange for outpatient ureteral stent exchange with possible repeat TURBT on Tuesday, okay to discharge from urologic perspective with Foley catheter in place -If the stent exchange fails to normalize her creatinine, we'll consider percutaneous nephrostomy tube -Plan was discussed with Dr. Margaretmary Eddy -B&O suppositories and [place] as needed for bladder spasms   LOS: 4 days    Hollice Espy 05/08/2016

## 2016-05-08 NOTE — Care Management Important Message (Signed)
Important Message  Patient Details  Name: Barbara [name] MRN: [id number] Date of Birth: 1940/08/02   Medicare Important Message Given:  Yes    Shelbie Ammons, RN 05/08/2016, 8:31 AM

## 2016-05-08 NOTE — Plan of Care (Signed)
Problem: Education: Goal: Knowledge of [facility] Education information/materials will improve Outcome: Progressing VSS, denies pain, nausea.  PRBC transfusion complete, no reaction suspected.  No acute issues overnight.  Foley draining yellow urine.  Call bell within reach, [place].

## 2016-05-08 NOTE — Progress Notes (Signed)
Barbara [name] at [place] NAME: Barbara [name]    MR#:  [id number]  DATE OF BIRTH:  [birth year]  SUBJECTIVE:  CHIEF COMPLAINT:  Patient is Reporting bladder spasms, mild hematuria , status post 1 unit of blood transfusion yesterday  REVIEW OF SYSTEMS:  CONSTITUTIONAL: No fever, fatigue or weakness.  EYES: No blurred or double vision.  EARS, NOSE, AND THROAT: No tinnitus or ear pain.  RESPIRATORY: No cough, shortness of breath, wheezing or hemoptysis.  CARDIOVASCULAR: No chest pain, orthopnea, edema.  GASTROINTESTINAL: No nausea, vomiting, diarrhea or abdominal pain.  GENITOURINARY: Reporting bladder spasms. Has hematuria  ENDOCRINE: No polyuria, nocturia,  HEMATOLOGY: No anemia, easy bruising or bleeding SKIN: No rash or lesion. MUSCULOSKELETAL: No joint pain or arthritis.   NEUROLOGIC: No tingling, numbness, weakness.  PSYCHIATRY: No anxiety or depression.   DRUG ALLERGIES:   Allergies  Allergen Reactions  . Percocet [Oxycodone-Acetaminophen] Hives    VITALS:  Blood pressure (!) 122/53, pulse 80, temperature 98.7 F (37.1 C), temperature source Oral, resp. rate 19, height 5' 8"  (1.727 m), weight 80.5 kg (177 lb 6.4 oz), SpO2 95 %.  PHYSICAL EXAMINATION:  GENERAL:  75 y.o.-year-old patient lying in the bed with no acute distress.  EYES: Pupils equal, round, reactive to light and accommodation. No scleral icterus. Extraocular muscles intact.  HEENT: Head atraumatic, normocephalic. Oropharynx and nasopharynx clear.  NECK:  Supple, no jugular venous distention. No thyroid enlargement, no tenderness.  LUNGS: Moderate breath sounds bilaterally, no wheezing, rales,rhonchi or crepitation. No use of accessory muscles of respiration.  CARDIOVASCULAR: S1, S2 normal. No murmurs, rubs, or gallops.  ABDOMEN: Soft, nontender, nondistended. Bowel sounds present. No organomegaly or mass.  EXTREMITIES: No pedal edema, cyanosis, or clubbing.   NEUROLOGIC: Cranial nerves II through XII are intact. Sensory intact gait not checked  PSYCHIATRIC: The patient is alert and oriented x 3.  SKIN: No obvious rash, lesion, or ulcer.    LABORATORY PANEL:   CBC  Recent Labs Lab 05/08/16 0550  WBC 8.0  HGB 7.5*  HCT 22.9*  PLT 172   ------------------------------------------------------------------------------------------------------------------  Chemistries   Recent Labs Lab 05/08/16 0550  NA 138  K 4.9  CL 118*  CO2 15*  GLUCOSE 89  BUN 57*  CREATININE 4.53*  CALCIUM 8.4*   ------------------------------------------------------------------------------------------------------------------  Cardiac Enzymes  Recent Labs Lab 05/04/16 1753  TROPONINI <0.03   ------------------------------------------------------------------------------------------------------------------  RADIOLOGY:  US Renal  Result Date: 05/07/2016 CLINICAL DATA:  Recent left-sided hydronephrosis. History of urinary bladder carcinoma. Left double-J stent in place EXAM: RENAL / URINARY TRACT ULTRASOUND COMPLETE COMPARISON:  May 04, 2016 FINDINGS: Right Kidney: Length: 10.4 cm. Echogenicity is increased. Renal cortical thickness within normal limits. No perinephric fluid or hydronephrosis visualized. There is a 7 x 8 x 7 mm cyst in the upper pole kidney medially. No sonographically demonstrable calculus or ureterectasis. Left Kidney: Length: 10.2 cm. Echogenicity is increased. Renal cortical thickness is within normal limits. Hydronephrosis appears essentially stable compared to recent prior study. Double-J stent is noted to be present on the left. The previously noted complex cystic structure in the mid left kidney persists, measuring 2.5 x 1.5 x 2.2 cm. No new mass. No perinephric fluid. No sonographically demonstrable calculus or ureterectasis evident. Bladder: Urinary bladder wall remains markedly thickened and somewhat irregular. IMPRESSION:  Essentially no change from recent prior examination. Persistent fairly mild hydronephrosis on the left with double-J stent in place. Complex mass left kidney midportion, likely a complex  cyst, stable. No new renal lesions evident. Irregular thickening of the urinary bladder wall remains. This finding is most likely due to the prior carcinoma of the urinary bladder. A degree of superimposed cystitis cannot be excluded by sonography. Electronically Signed   By: Lowella Grip III M.D.   On: 05/07/2016 09:40    EKG:   Orders placed or performed during the hospital encounter of 05/04/16  . ED EKG within 10 minutes  . ED EKG within 10 minutes    ASSESSMENT AND PLAN:   # acute on chronic renal failure -5 due to worsening recurrent left-sided hydronephrosis; Slow progression clinically status post ureteral stent on the left side  Baseline creatinine 2.3 and GFR 23 Cr 6.48 -5.75 - -4.92-4.52--4.53 Repeat renal ultrasound with no significant changes Urology is  considering to changing stent on Tuesday as an outpatient at this time Follow-up with nephrology and urology Discontinue IV fluids as there is no significant improvement in the creatinine at this time Continue Foley catheter for bladder decompression  For bladder cancer continue chemotherapy, radiation as appropriate.  Continue opiate belladonna suppositories as needed for bladder spasms. Urology has recommended to discharge patient with Ditropan 5 mg 3 times a day as needed for bladder spasms at the time of discharge  # chest pain nonspecific   Improved with tramadol  troponins  are negative.    #4 acute on chronic anemia (hemoglobin 8.7 on October 5 and 7.8 --7.2 - 6.8-7.0-6.5-7.5  s/p one unit of blood transition yesterday 05/07/2016   #Hyperkalemia  Resolved , small dose Kayexalate given , pot - 4.9   Anorexia  PT consult is placed   anticipate discharge in a.m. if patient is clinically stable and labs are stable       All the records are reviewed and case discussed with Care Management/Social Workerr. Management plans discussed with the patient, family and they are in agreement.  CODE STATUS: fc   TOTAL TIME TAKING CARE OF THIS PATIENT: 36 minutes.   POSSIBLE D/C IN  2 DAYS, DEPENDING ON CLINICAL CONDITION.  Note: This dictation was prepared with Dragon dictation along with smaller phrase technology. Any transcriptional errors that result from this process are unintentional.   Nicholes Mango M.D on 05/08/2016 at 1:10 PM  Between 7am to 6pm - Pager - [phone] After 6pm go to [email or web address] - password [email or web address] [facility]  Office  [phone]  CC: Primary care physician; Madelyn Brunner, MD

## 2016-05-09 LAB — BASIC METABOLIC PANEL
ANION GAP: 6 (ref 5–15)
BUN: 55 mg/dL — ABNORMAL HIGH (ref 6–20)
CALCIUM: 8.4 mg/dL — AB (ref 8.9–10.3)
CO2: 16 mmol/L — ABNORMAL LOW (ref 22–32)
Chloride: 117 mmol/L — ABNORMAL HIGH (ref 101–111)
Creatinine, Ser: 4.12 mg/dL — ABNORMAL HIGH (ref 0.44–1.00)
GFR, EST AFRICAN AMERICAN: 11 mL/min — AB (ref >60)
GFR, EST NON AFRICAN AMERICAN: 10 mL/min — AB (ref >60)
Glucose, Bld: 106 mg/dL — ABNORMAL HIGH (ref 65–99)
Potassium: 5 mmol/L (ref 3.5–5.1)
Sodium: 139 mmol/L (ref 135–145)

## 2016-05-09 LAB — CBC
HEMATOCRIT: 21.9 % — AB (ref 35.0–47.0)
Hemoglobin: 7.2 g/dL — ABNORMAL LOW (ref 12.0–16.0)
MCH: 27.3 pg (ref 26.0–34.0)
MCHC: 32.9 g/dL (ref 32.0–36.0)
MCV: 83.2 fL (ref 80.0–100.0)
PLATELETS: 219 10*3/uL (ref 150–440)
RBC: 2.64 MIL/uL — AB (ref 3.80–5.20)
RDW: 16.6 % — AB (ref 11.5–14.5)
WBC: 9.8 10*3/uL (ref 3.6–11.0)

## 2016-05-09 LAB — GLUCOSE, CAPILLARY: Glucose-Capillary: 104 mg/dL — ABNORMAL HIGH (ref 65–99)

## 2016-05-09 MED ORDER — TRAMADOL HCL 50 MG PO TABS: 50.0000 mg | ORAL_TABLET | Freq: Three times a day (TID) | ORAL | 0 refills | Status: DC | PRN

## 2016-05-09 MED ORDER — ACETAMINOPHEN 325 MG PO TABS: 650.0000 mg | ORAL_TABLET | Freq: Four times a day (QID) | ORAL | Status: DC | PRN

## 2016-05-09 MED ORDER — OXYBUTYNIN CHLORIDE 5 MG PO TABS: 5.0000 mg | ORAL_TABLET | Freq: Three times a day (TID) | ORAL | Status: DC | PRN

## 2016-05-09 MED ORDER — HEPARIN SOD (PORK) LOCK FLUSH 100 UNIT/ML IV SOLN
500.0000 [IU] | Freq: Once | INTRAVENOUS | Status: AC
  Administered 2016-05-09: 14:00:00 500 [IU] via INTRAVENOUS
  Filled 2016-05-09: qty 5

## 2016-05-09 MED ORDER — OXYBUTYNIN CHLORIDE 5 MG PO TABS: 5.0000 mg | ORAL_TABLET | Freq: Three times a day (TID) | ORAL | 0 refills | Status: DC | PRN

## 2016-05-09 MED ORDER — DOCUSATE SODIUM 100 MG PO CAPS: 100.0000 mg | ORAL_CAPSULE | Freq: Two times a day (BID) | ORAL | 0 refills | Status: DC | PRN

## 2016-05-09 NOTE — Care Management Note (Addendum)
Case Management Note  Patient Details  Name: Barbara [name] MRN: [id number] Date of Birth: 04-Mar-1941  Subjective/Objective:   Discussed discharge planning with Dr Margaretmary Eddy. Dr Margaretmary Eddy is putting in orders for HH=RN, PT, SW, Aide. Ms Barbara [name] is refusing SNF despite the SNF recommendation by [facility]-PT.  Ms Barbara [name] resides with her husband Barbara [name] and has a RW and cane at home. All home health orders faxed to [place].                 Action/Plan:   Expected Discharge Date:                  Expected Discharge Plan:     In-House Referral:     Discharge planning Services     Post Acute Care Choice:    Choice offered to:     DME Arranged:    DME Agency:     HH Arranged:    HH Agency:     Status of Service:     If discussed at [employer] of Stay Meetings, dates discussed:    Additional Comments:  Kobie Matkins A, RN 05/09/2016, 12:59 PM

## 2016-05-09 NOTE — Progress Notes (Signed)
[facility]  ROUNDING NOTE   Subjective:  S Creatinine has slightly improved today UOP 1650 cc Resting quietly this morning. Denies any shortness of breath   Objective:  Vital signs in last 24 hours:  Temp:  [99 F (37.2 C)-99.6 F (37.6 C)] 99 F (37.2 C) (10/21 0408) Pulse Rate:  [78-93] 93 (10/21 0408) Resp:  [18-21] 20 (10/21 0408) BP: (120-136)/(48-62) 136/53 (10/21 0408) SpO2:  [95 %-98 %] 95 % (10/21 0408) Weight:  [78 kg (172 lb)] 78 kg (172 lb) (10/21 0539)  Weight change: -2.449 kg (-5 lb 6.4 oz) Filed Weights   05/07/16 0543 05/08/16 0556 05/09/16 0539  Weight: 80.2 kg (176 lb 14.4 oz) 80.5 kg (177 lb 6.4 oz) 78 kg (172 lb)    Intake/Output: I/O last 3 completed shifts: In: 350 [Blood:350] Out: 2725 [Urine:1650]   Intake/Output this shift:  Total I/O In: 120 [P.O.:120] Out: -   Physical Exam: General: No acute distress  Head: Normocephalic, atraumatic. Moist oral mucosal membranes  Eyes: Anicteric  Neck: Supple, trachea midline  Lungs:  Clear to auscultation, normal effort  Heart: S1S2 no rubs  Abdomen:  Soft, nontender, BS present  Extremities: some peripheral edema.  Neurologic: Nonfocal, moving all four extremities  Skin: No lesions  GU: Foley catheter in place, no hematuria now    Basic Metabolic Panel:  Recent Labs Lab 05/05/16 0500 05/06/16 0615 05/07/16 0500 05/08/16 0550 05/09/16 0500  NA 134* 135 139 138 139  K 5.2* 4.9 4.5 4.9 5.0  CL 109 113* 118* 118* 117*  CO2 15* 15* 15* 15* 16*  GLUCOSE 96 100* 84 89 106*  BUN 71* 65* 60* 57* 55*  CREATININE 5.75* 4.92* 4.52* 4.53* 4.12*  CALCIUM 8.4* 8.2* 8.2* 8.4* 8.4*    Liver Function Tests: No results for input(s): AST, ALT, ALKPHOS, BILITOT, PROT, ALBUMIN in the last 168 hours. No results for input(s): LIPASE, AMYLASE in the last 168 hours. No results for input(s): AMMONIA in the last 168 hours.  CBC:  Recent Labs Lab 05/06/16 0200 05/06/16 0615  05/07/16 0500 05/08/16 0550 05/09/16 0500  WBC 6.0 6.6 5.9 8.0 9.8  HGB 6.8* 7.0* 6.5* 7.5* 7.2*  HCT 21.3* 21.4* 20.3* 22.9* 21.9*  MCV 83.4 82.9 82.9 83.9 83.2  PLT 126* 140* 139* 172 219    Cardiac Enzymes:  Recent Labs Lab 05/04/16 1109 05/04/16 1753  TROPONINI <0.03 <0.03    BNP: Invalid input(s): POCBNP  CBG:  Recent Labs Lab 05/06/16 0813 05/07/16 0809 05/08/16 0720 05/08/16 1145 05/09/16 0750  GLUCAP 103* 85 95 121* 104*    Microbiology: Results for orders placed or performed in visit on 04/23/16  Urine culture     Status: None   Collection Time: 04/23/16 11:38 AM  Result Value Ref Range Status   Specimen Description URINE, CLEAN CATCH  Final   Special Requests NONE  Final   Culture NO GROWTH Performed at [facility]   Final   Report Status 04/24/2016 FINAL  Final    Coagulation Studies: No results for input(s): LABPROT, INR in the last 72 hours.  Urinalysis: No results for input(s): COLORURINE, LABSPEC, PHURINE, GLUCOSEU, HGBUR, BILIRUBINUR, KETONESUR, PROTEINUR, UROBILINOGEN, NITRITE, LEUKOCYTESUR in the last 72 hours.  Invalid input(s): APPERANCEUR    Imaging: No results found.   Medications:     . atorvastatin  40 mg Oral q1800  . cholecalciferol  1,000 Units Oral q morning - 10a  . docusate sodium  100 mg Oral BID  .  gabapentin  100 mg Oral BID  . mirabegron ER  25 mg Oral Daily  . pantoprazole  40 mg Oral Daily  . traMADol  50 mg Oral Q12H   acetaminophen, albuterol, bisacodyl, ondansetron **OR** ondansetron (ZOFRAN) IV, opium-belladonna  Assessment/ Plan:  75 y.o. female with long-standing hypertension, diabetes type 2, hyperlipidemia, osteoarthritis, obstructive sleep apnea, bladder cancer 8/17 s/p TURBT and radiation therapy/chemo with gemcitabine.  1. Acute renal failure/chronic kidney disease stage IV/recurrent hydronephrosis.  Baseline creatinine 2.3 EGFR 23. Acute renal failure now could be related to recurrent  hydronephrosis. - Creatinine down to 4.12 with a BUN of 55.  - Fair urine output noted as well. No indication for dialysis at the moment.  - urology followup as outpatient  2. Anemia of chronic kidney disease. Hemoglobin down to 7.2 - transfusion as per hospitalist  - Avoiding epogen due to tumor   3. Hyponatremia.  - Improved   LOS: 5 Barbara [name] 10/21/201712:40 PM

## 2016-05-09 NOTE — Discharge Summary (Signed)
[facility] [place] NAME: Barbara [name]    MR#:  [id number]  DATE OF BIRTH:  [birth year]  DATE OF ADMISSION:  05/04/2016 ADMITTING PHYSICIAN: Epifanio Lesches, MD  DATE OF DISCHARGE: 05/09/16 PRIMARY CARE PHYSICIAN: Madelyn Brunner, MD    ADMISSION DIAGNOSIS:  Nonspecific chest pain [R07.9] Acute renal failure, unspecified acute renal failure type ([place]) [N17.9]  DISCHARGE DIAGNOSIS:  Active Problems:   Acute on chronic renal failure (HCC)   Protein-calorie malnutrition, severe Generalized weakness refused skilled facility for rehabilitation Hematuria Bladder cancer chronic  SECONDARY DIAGNOSIS:   Past Medical History:  Diagnosis Date  . Anemia associated with chronic renal failure   . Arthritis   . Chronic kidney disease   . CKD (chronic kidney disease)   . COPD (chronic obstructive pulmonary disease) ([place])   . Diabetes mellitus without complication ([place])   . Hypertension   . Sleep apnea    uses cpap  . Urinary obstruction 01/2016    HOSPITAL COURSE:   # acute on chronic renal failure -5 due to worsening recurrent left-sided hydronephrosis; Slow progression clinically status post ureteral stent on the left side  Baseline creatinine 2.3 and GFR 23 Cr 6.48 -5.75 - -4.92-4.52--4.53--4.12 Repeat renal ultrasound with no significant changes Urology is  considering to changing stent on Tuesday 10/24 as an outpatient  Follow-up with nephrology and urology op Discontinue IV fluids as there is no significant improvement in the creatinine at this time Continue Foley catheter for bladder decompression For bladder cancer continue chemotherapy, radiation as appropriate.  Given opiate belladonna suppositories as needed for bladder spasms. Urology has recommended to discharge patient with Ditropan 5 mg 3 times a day as needed for bladder spasms at the time of discharge  # chest pain nonspecific   Improved with tramadol  troponins are negative.  #4 acute on chronic anemia (hemoglobin 8.7 on October 5 and 7.8 --7.2 - 6.8-7.0-6.5-7.5-7.2  s/p one unit of blood transition  05/07/2016 , refused to repeat blood transfusion today she prefers going home  #Hyperkalemia  Resolved , small dose Kayexalate given , pot - 4.9   Anorexia  PT consult recommending skilled nursing facility. Patient refused and wants to go home with home health  DISCHARGE CONDITIONS:   fair  CONSULTS OBTAINED:  Treatment Team:  Anthonette Legato, MD   PROCEDURES none  DRUG ALLERGIES:   Allergies  Allergen Reactions  . Percocet [Oxycodone-Acetaminophen] Hives    DISCHARGE MEDICATIONS:   Current Discharge Medication List    START taking these medications   Details  acetaminophen (TYLENOL) 325 MG tablet Take 2 tablets (650 mg total) by mouth every 6 (six) hours as needed for mild pain.    docusate sodium (COLACE) 100 MG capsule Take 1 capsule (100 mg total) by mouth 2 (two) times daily as needed for mild constipation. Qty: 10 capsule, Refills: 0    oxybutynin (DITROPAN) 5 MG tablet Take 1 tablet (5 mg total) by mouth every 8 (eight) hours as needed for bladder spasms. Qty: 30 tablet, Refills: 0    traMADol (ULTRAM) 50 MG tablet Take 1 tablet (50 mg total) by mouth every 8 (eight) hours as needed. Qty: 30 tablet, Refills: 0      CONTINUE these medications which have NOT CHANGED   Details  Albuterol Sulfate (PROAIR RESPICLICK) 202 (90 Base) MCG/ACT AEPB Inhale 2 puffs into the lungs 4 (four) times daily as needed (wheezing).     esomeprazole (NEXIUM) 40  MG capsule Take 40 mg by mouth daily at 12 noon.    furosemide (LASIX) 40 MG tablet Take 40 mg by mouth 2 (two) times daily.    gabapentin (NEURONTIN) 300 MG capsule Take 600 mg by mouth 3 (three) times daily.    insulin glargine (LANTUS) 100 unit/mL SOPN Inject into the skin at bedtime. 38 units every am & 36 units every evening    mirabegron ER (MYRBETRIQ)  25 MG TB24 tablet Take 1 tablet (25 mg total) by mouth daily. Qty: 30 tablet, Refills: 1    atorvastatin (LIPITOR) 40 MG tablet Take 40 mg by mouth daily at 6 PM.     Cholecalciferol (VITAMIN D-3) 1000 units CAPS Take 1 capsule by mouth every morning.    dronabinol (MARINOL) 2.5 MG capsule Take 1 capsule (2.5 mg total) by mouth 2 (two) times daily before lunch and supper. Qty: 60 capsule, Refills: 5    lidocaine-prilocaine (EMLA) cream Apply 1 application topically as needed. Qty: 30 g, Refills: 3   Associated Diagnoses: Portacath in place    NON FORMULARY Place 1 Units into the nose at bedtime. CPAP Time of use 2100-0600         DISCHARGE INSTRUCTIONS:  Activity per PT recommendations home PT Diet renal and diabetic Follow-up with primary care physician in 3-4 days. PCP to repeat her hemoglobin and hematocrit and transfuse as needed Follow-up with Dr. Erlene Quan on October 24 for stent placement Follow-up with nephrology in a week Continue Foley catheter    DIET:   Diet renal and diabetic DISCHARGE CONDITION:  Fair  ACTIVITY:  Activity as tolerated  OXYGEN:  Home Oxygen: No.   Oxygen Delivery: room air  DISCHARGE LOCATION:  Home patient refused snf  If you experience worsening of your admission symptoms, develop shortness of breath, life threatening emergency, suicidal or homicidal thoughts you must seek medical attention immediately by calling 911 or calling your MD immediately  if symptoms less severe.  You Must read complete instructions/literature along with all the possible adverse reactions/side effects for all the Medicines you take and that have been prescribed to you. Take any new Medicines after you have completely understood and accpet all the possible adverse reactions/side effects.   Please note  You were cared for by a hospitalist during your hospital stay. If you have any questions about your discharge medications or the care you received while you  were in the hospital after you are discharged, you can call the unit and asked to speak with the hospitalist on call if the hospitalist that took care of you is not available. Once you are discharged, your primary care physician will handle any further medical issues. Please note that NO REFILLS for any discharge medications will be authorized once you are discharged, as it is imperative that you return to your primary care physician (or establish a relationship with a primary care physician if you do not have one) for your aftercare needs so that they can reassess your need for medications and monitor your lab values.     Today  Chief Complaint  Patient presents with  . Chest Pain   Patient is feeling fine. Wants to go home. He refused blood transfusion. Refused rehabilitation at snf.  husband at bedside  ROS:  CONSTITUTIONAL: Denies fevers, chills. Denies any fatigue, weakness.  EYES: Denies blurry vision, double vision, eye pain. EARS, NOSE, THROAT: Denies tinnitus, ear pain, hearing loss. RESPIRATORY: Denies cough, wheeze, shortness of breath.  CARDIOVASCULAR: Denies chest  pain, palpitations, edema.  GASTROINTESTINAL: Denies nausea, vomiting, diarrhea, abdominal pain. Denies bright red blood per rectum. GENITOURINARY: Denies dysuria, has hematuria. Foley catheter intact ENDOCRINE: Denies nocturia or thyroid problems. HEMATOLOGIC AND LYMPHATIC: Denies easy bruising or bleeding. SKIN: Denies rash or lesion. MUSCULOSKELETAL: Denies pain in neck, back, shoulder, knees, hips or arthritic symptoms.  NEUROLOGIC: Reporting generalized weakness Denies paralysis, paresthesias.  PSYCHIATRIC: Denies anxiety or depressive symptoms.   VITAL SIGNS:  Blood pressure (!) 119/53, pulse 86, temperature 98.3 F (36.8 C), resp. rate 20, height 5' 8"  (1.727 m), weight 78 kg (172 lb), SpO2 96 %.  I/O:    Intake/Output Summary (Last 24 hours) at 05/09/16 1320 Last data filed at 05/09/16 0800  Gross  per 24 hour  Intake              120 ml  Output             1150 ml  Net            -1030 ml    PHYSICAL EXAMINATION:  GENERAL:  75 y.o.-year-old patient lying in the bed with no acute distress.  EYES: Pupils equal, round, reactive to light and accommodation. No scleral icterus. Extraocular muscles intact.  HEENT: Head atraumatic, normocephalic. Oropharynx and nasopharynx clear.  NECK:  Supple, no jugular venous distention. No thyroid enlargement, no tenderness.  LUNGS: Moderate breath sounds bilaterally, no wheezing, rales,rhonchi or crepitation. No use of accessory muscles of respiration.  CARDIOVASCULAR: S1, S2 normal. No murmurs, rubs, or gallops.  ABDOMEN: Soft, non-tender, non-distended. Bowel sounds present. No organomegaly or mass.  EXTREMITIES: No pedal edema, cyanosis, or clubbing.  NEUROLOGIC: Cranial nerves II through XII are intact. Muscle strength at her baseline in all extremities. Sensation intact. Gait not checked.  PSYCHIATRIC: The patient is alert and oriented x 3.  SKIN: No obvious rash, lesion, or ulcer.   DATA REVIEW:   CBC  Recent Labs Lab 05/09/16 0500  WBC 9.8  HGB 7.2*  HCT 21.9*  PLT 219    Chemistries   Recent Labs Lab 05/09/16 0500  NA 139  K 5.0  CL 117*  CO2 16*  GLUCOSE 106*  BUN 55*  CREATININE 4.12*  CALCIUM 8.4*    Cardiac Enzymes  Recent Labs Lab 05/04/16 1753  TROPONINI <0.03    Microbiology Results  Results for orders placed or performed in visit on 04/23/16  Urine culture     Status: None   Collection Time: 04/23/16 11:38 AM  Result Value Ref Range Status   Specimen Description URINE, CLEAN CATCH  Final   Special Requests NONE  Final   Culture NO GROWTH Performed at [facility]   Final   Report Status 04/24/2016 FINAL  Final    RADIOLOGY:  US Renal  Result Date: 05/07/2016 CLINICAL DATA:  Recent left-sided hydronephrosis. History of urinary bladder carcinoma. Left double-J stent in place EXAM: RENAL  / URINARY TRACT ULTRASOUND COMPLETE COMPARISON:  May 04, 2016 FINDINGS: Right Kidney: Length: 10.4 cm. Echogenicity is increased. Renal cortical thickness within normal limits. No perinephric fluid or hydronephrosis visualized. There is a 7 x 8 x 7 mm cyst in the upper pole kidney medially. No sonographically demonstrable calculus or ureterectasis. Left Kidney: Length: 10.2 cm. Echogenicity is increased. Renal cortical thickness is within normal limits. Hydronephrosis appears essentially stable compared to recent prior study. Double-J stent is noted to be present on the left. The previously noted complex cystic structure in the mid left kidney persists, measuring  2.5 x 1.5 x 2.2 cm. No new mass. No perinephric fluid. No sonographically demonstrable calculus or ureterectasis evident. Bladder: Urinary bladder wall remains markedly thickened and somewhat irregular. IMPRESSION: Essentially no change from recent prior examination. Persistent fairly mild hydronephrosis on the left with double-J stent in place. Complex mass left kidney midportion, likely a complex cyst, stable. No new renal lesions evident. Irregular thickening of the urinary bladder wall remains. This finding is most likely due to the prior carcinoma of the urinary bladder. A degree of superimposed cystitis cannot be excluded by sonography. Electronically Signed   By: Lowella Grip III M.D.   On: 05/07/2016 09:40    EKG:   Orders placed or performed during the hospital encounter of 05/04/16  . ED EKG within 10 minutes  . ED EKG within 10 minutes      Management plans discussed with the patient, family and they are in agreement.  CODE STATUS:     Code Status Orders        Start     Ordered   05/04/16 1452  Full code  Continuous     05/04/16 1455    Code Status History    Date Active Date Inactive Code Status Order ID Comments User Context   04/17/2016  1:36 PM 04/20/2016  8:41 PM Full Code [id number]  Demetrios Loll, MD Inpatient    03/04/2016  1:58 PM 03/10/2016  2:40 PM Full Code [id number]  Hillary Bow, MD ED   01/21/2016  9:15 PM 01/27/2016  6:01 PM Full Code [id number]  Edwin Dada, MD Inpatient   01/20/2016  6:41 PM 01/21/2016  9:15 PM Full Code [id number]  Theodoro Grist, MD Inpatient   12/18/2015  2:14 AM 12/23/2015  4:51 PM Full Code [id number]  Saundra Shelling, MD Inpatient      TOTAL TIME TAKING CARE OF THIS PATIENT: 45  minutes.   Note: This dictation was prepared with Dragon dictation along with smaller phrase technology. Any transcriptional errors that result from this process are unintentional.   @[facility] @  on 05/09/2016 at 1:20 PM  Between 7am to 6pm - Pager - [phone]  After 6pm go to [email or web address] - password [email or web address] [facility]  Office  [phone]  CC: Primary care physician; Madelyn Brunner, MD

## 2016-05-09 NOTE — Discharge Instructions (Signed)
Activity per PT recommendations Diet renal and diabetic Follow-up with primary care physician in 3-4 days. PCP to repeat her hemoglobin and hematocrit and transfuse as needed Follow-up with Dr. Erlene Quan on October 24 for stent placement Follow-up with nephrology in a week Continue Foley catheter

## 2016-05-09 NOTE — Clinical Social Work Note (Signed)
Clinical Social Work Assessment  Patient Details  Name: Barbara [name] MRN: [id number] Date of Birth: [birth year]  Date of referral:  05/09/16               Reason for consult:  Discharge Planning                Permission sought to share information with:  Family Supports Permission granted to share information::  Yes, Verbal Permission Granted  Name::        Agency::     Relationship::   (Husband)  Contact Information:     Housing/Transportation Living arrangements for the past 2 months:  Single Family Home Source of Information:  Patient Patient Interpreter Needed:  None Criminal Activity/Legal Involvement Pertinent to Current Situation/Hospitalization:  No - Comment as needed Significant Relationships:  Spouse Lives with:  Spouse Do you feel safe going back to the place where you live?  Yes Need for family participation in patient care:  No (Coment)  Care giving concerns:  PT recommenced that patient will benefit from SNF placement for STR at discharge.    Social Worker assessment / plan:  CSW met with patient and her husband at bedside. Introduced herself and her role. CSW dicussed PT recommendations with patient. Patient reported that she'd like to go home at discharge. She sated that she does not want to go to STR. Stated she's agreeable for [facility] services in her home. CSW informed MD and RNCM of above. CSW is signing off but is available if a CSW need were to arise.   Employment status:  Retired Nurse, adult PT Recommendations:  [place] / Referral to community resources:  [place]  Patient/Family's Response to care:  Patient declined SNF placement.   Patient/Family's Understanding of and Emotional Response to Diagnosis, Current Treatment, and Prognosis:  Patient reported she understood. Thanked CSW for assistance.   Emotional Assessment Appearance:  Appears stated age Attitude/Demeanor/Rapport:    (None) Affect (typically observed):  Accepting, Calm Orientation:  Oriented to Self, Oriented to Place, Oriented to  Time, Oriented to Situation Alcohol / Substance use:  Not Applicable Psych involvement (Current and /or in the community):  No (Comment)  Discharge Needs  Concerns to be addressed:  Discharge Planning Concerns Readmission within the last 30 days:  No Current discharge risk:  Chronically ill Barriers to Discharge:  Continued Medical Work up   [employer], [place] 05/09/2016, 2:40 PM

## 2016-05-09 NOTE — Evaluation (Signed)
Physical Therapy Evaluation Patient Details Name: Barbara [name] MRN: [id number] DOB: 1940/09/15 Today's Date: 05/09/2016   History of Present Illness  75 yo female with onset of chest pain and seizure, hematuria, constant urination urge.  History of bladder CA, chemotherapy now, c-diff history, COPD, CKD, DM  Clinical Impression  Pt is getting up to walk with PT very short trip after a very extended time to stand up and with RW.  Her PLOF was better with gait without assist, very much in pain and passing mucus with blood on bed pad.  Talked with nurse about this and described the linen in her room with the blood.  Clear signs of loss of hgb source which will be conveyed to MD.  Follow acutely as pt is able to tolerate to increase LE strength and progress her gait and standing tolerance with regard to safety and total endurance to increase success at SNF and get home faster.    Follow Up Recommendations SNF    Equipment Recommendations  None recommended by PT    Recommendations for Other Services       Precautions / Restrictions Precautions Precautions: Fall (telemetry) Restrictions Weight Bearing Restrictions: No      Mobility  Bed Mobility Overal bed mobility: Needs Assistance Bed Mobility: Supine to Sit;Sit to Supine     Supine to sit: Mod assist Sit to supine: Max assist;Mod assist   General bed mobility comments: needs extensive time to transition due to pain and general conditioning, extended bedrest  Transfers Overall transfer level: Needs assistance Equipment used: Rolling walker (2 wheeled);1 person hand held assist Transfers: Sit to/from Stand Sit to Stand: Min assist         General transfer comment: continual attempts to put both hands on the walker to stand despite redirection, finally pushed off bed with both hands  Ambulation/Gait Ambulation/Gait assistance: Min assist Ambulation Distance (Feet): 3 Feet Assistive device: Rolling walker (2  wheeled) Gait Pattern/deviations: Decreased stride length;Step-to pattern;Wide base of support (sidsteps) Gait velocity: reduced Gait velocity interpretation: Below normal speed for age/gender General Gait Details: poor quality but sidesteps on side of bed, declines further  Stairs Stairs:  (declined)          Wheelchair Mobility    Modified Rankin (Stroke Patients Only)       Balance                                             Pertinent Vitals/Pain Pain Assessment: Faces Pain Score: 7  Faces Pain Scale: Hurts whole lot Pain Location: abdomen at times esp with movement Pain Descriptors / Indicators: Grimacing Pain Intervention(s): Limited activity within patient's tolerance;Monitored during session;Repositioned    Home Living Family/patient expects to be discharged to:: Private residence Living Arrangements: Spouse/significant other Available Help at Discharge: Family;Available 24 hours/day Type of Home: House Home Access: Stairs to enter Entrance Stairs-Rails: None Entrance Stairs-Number of Steps: 1 Home Layout: One level Home Equipment: Walker - 2 wheels;Cane - single point;Wheelchair - manual Additional Comments: Pt's husband reports he is always home to assist pt.    Prior Function Level of Independence: Independent with assistive device(s)   Gait / Transfers Assistance Needed: Pt reports she ambulates at home with [facility] and [facility]. Pt ambulates household distances only (room to room)  ADL's / Homemaking Assistance Needed: does her own self care  Hand Dominance        Extremity/Trunk Assessment   Upper Extremity Assessment: Overall WFL for tasks assessed           Lower Extremity Assessment: Generalized weakness      Cervical / Trunk Assessment: Kyphotic  Communication   Communication: No difficulties  Cognition Arousal/Alertness: Awake/alert Behavior During Therapy: Anxious Overall Cognitive Status: Difficult to  assess       Memory: Decreased recall of precautions              General Comments      Exercises     Assessment/Plan    PT Assessment Patient needs continued PT services  PT Problem List Decreased strength;Decreased range of motion;Decreased activity tolerance;Decreased balance;Decreased mobility;Decreased coordination;Decreased cognition;Decreased knowledge of use of DME;Decreased safety awareness;Decreased knowledge of precautions;Cardiopulmonary status limiting activity;Decreased skin integrity;Pain          PT Treatment Interventions DME instruction;Gait training;Stair training;Functional mobility training;Therapeutic activities;Therapeutic exercise;Balance training;Neuromuscular re-education;Patient/family education    PT Goals (Current goals can be found in the Care Plan section)  Acute Rehab PT Goals Patient Stated Goal: to feel better, get home PT Goal Formulation: With patient/family Time For Goal Achievement: 05/23/16 Potential to Achieve Goals: Fair    Frequency Min 2X/week   Barriers to discharge Inaccessible home environment      Co-evaluation               End of Session Equipment Utilized During Treatment: Gait belt Activity Tolerance: Patient limited by pain Patient left: in bed;with call bell/phone within reach;with bed alarm set Nurse Communication: Mobility status         Time: 2947-6546 PT Time Calculation (min) (ACUTE ONLY): 29 min   Charges:   PT Evaluation $PT Eval Moderate Complexity: 1 Procedure PT Treatments $Gait Training: 8-22 mins   PT G Codes:        Ramond Dial June 06, 2016, 1:25 PM    Mee Hives, PT MS Acute Rehab Dept. Number: [place] and [place]

## 2016-05-09 NOTE — Progress Notes (Addendum)
Patient discharged home with home health. VSS. Foley in place and draining. Patient and husband educated on foley care and they voiced understanding of teaching. Patient refuses SNF and blood transfusion. Directions were given to patient and husband on procedure scheduled on Tuesday with Dr. Erlene Quan. Prescriptions given to patient. All discharge instructions given and all questions answered.

## 2016-05-09 NOTE — Plan of Care (Signed)
Problem: Education: Goal: Knowledge of [place] [facility] Education information/materials will improve Outcome: Progressing No complaints overnight, denies pain.  Reported needing to urinate x2 during shift, discussed foley catheter, draining well, yellow urine.  Call bell within reach, [place].

## 2016-05-11 ENCOUNTER — Other Ambulatory Visit: Payer: Self-pay | Admitting: Radiology

## 2016-05-11 ENCOUNTER — Ambulatory Visit: Payer: Medicare Other

## 2016-05-11 ENCOUNTER — Encounter: Payer: Self-pay | Admitting: Emergency Medicine

## 2016-05-11 ENCOUNTER — Emergency Department
Admission: EM | Admit: 2016-05-11 | Discharge: 2016-05-11 | Disposition: A | Discharge status: Home / Self Care | Payer: Medicare Other | Source: Home / Self Care | Attending: Emergency Medicine | Admitting: Emergency Medicine

## 2016-05-11 DIAGNOSIS — I2510 Atherosclerotic heart disease of native coronary artery without angina pectoris: Secondary | ICD-10-CM | POA: Insufficient documentation

## 2016-05-11 DIAGNOSIS — Z87891 Personal history of nicotine dependence: Secondary | ICD-10-CM

## 2016-05-11 DIAGNOSIS — I129 Hypertensive chronic kidney disease with stage 1 through stage 4 chronic kidney disease, or unspecified chronic kidney disease: Secondary | ICD-10-CM | POA: Insufficient documentation

## 2016-05-11 DIAGNOSIS — Z794 Long term (current) use of insulin: Secondary | ICD-10-CM | POA: Insufficient documentation

## 2016-05-11 DIAGNOSIS — J449 Chronic obstructive pulmonary disease, unspecified: Secondary | ICD-10-CM

## 2016-05-11 DIAGNOSIS — Z466 Encounter for fitting and adjustment of urinary device: Secondary | ICD-10-CM

## 2016-05-11 DIAGNOSIS — E1122 Type 2 diabetes mellitus with diabetic chronic kidney disease: Secondary | ICD-10-CM

## 2016-05-11 DIAGNOSIS — Z8551 Personal history of malignant neoplasm of bladder: Secondary | ICD-10-CM | POA: Insufficient documentation

## 2016-05-11 DIAGNOSIS — N1330 Unspecified hydronephrosis: Secondary | ICD-10-CM

## 2016-05-11 DIAGNOSIS — N184 Chronic kidney disease, stage 4 (severe): Secondary | ICD-10-CM

## 2016-05-11 DIAGNOSIS — Z79899 Other long term (current) drug therapy: Secondary | ICD-10-CM | POA: Insufficient documentation

## 2016-05-11 DIAGNOSIS — C679 Malignant neoplasm of bladder, unspecified: Secondary | ICD-10-CM

## 2016-05-11 DIAGNOSIS — E875 Hyperkalemia: Secondary | ICD-10-CM | POA: Diagnosis not present

## 2016-05-11 NOTE — ED Triage Notes (Addendum)
Pt presents to ED with reports of foley catheter coming out this morning. Pt had catheter placed here last week for urinary retention. Pt brought catheter, balloon is still intact. Pt reports pain upon catheter coming out and spotting. Pt states catheter came out around 10 this morning. Pt denies voiding.

## 2016-05-11 NOTE — Telephone Encounter (Signed)
Notified pt of surgery scheduled with Dr Erlene Quan on 05/12/16, to be npo after mn including medications, and to arrive at pre-admit testing on 05/12/16 @9 :00. Advised pt to take medications to the appt. Pt voices understanding & requests that daughter, Parke Simmers [phone]), be advised of same.  This was done & Parke Simmers also voices understanding.

## 2016-05-11 NOTE — ED Provider Notes (Signed)
[facility] Emergency Department Provider Note   ____________________________________________   First MD Initiated Contact with Patient 05/11/16 1452     (approximate)  I have reviewed the triage vital signs and the nursing notes.   HISTORY  Chief Complaint Foley catheter came out   HPI Barbara [name] is a 75 y.o. female recently discharged from the hospital for renal failure, chest pain and urinary instruction was given emergency department today after rolling her Foley out. Her husband reports that she was rolling around on the bed, fully came out with the balloon still intact. She has small amount of spotting after the Foley catheter was removed. At this point does not have any further bleeding. She is actually been able to urinate yellow urine into her depends. Not reporting any pain at this time. Has a follow-up appointment tomorrow with Dr. Tobe Sos where the catheter was scheduled to be removed and she is also being evaluated for possible changing a ureteral stent.   Past Medical History:  Diagnosis Date  . Anemia associated with chronic renal failure   . Arthritis   . Chronic kidney disease   . CKD (chronic kidney disease)   . COPD (chronic obstructive pulmonary disease) ([place])   . Diabetes mellitus without complication ([place])   . Hypertension   . Sleep apnea    uses cpap  . Urinary obstruction 01/2016    Patient Active Problem List   Diagnosis Date Noted  . Protein-calorie malnutrition, severe 05/05/2016  . Acute on chronic renal failure ([place]) 05/04/2016  . Seizure ([place]) 04/17/2016  . Palliative care by specialist   . DNR (do not resuscitate) discussion   . C. difficile diarrhea 03/11/2016  . Anemia 03/11/2016  . Hypotension 03/11/2016  . Bladder cancer ([place]) 03/11/2016  . Acidosis 03/11/2016  . Failure to thrive (child) 03/11/2016  . Weakness generalized 03/11/2016  . ARF (acute renal failure) ([place]) 03/04/2016  . Cancer of trigone of  urinary bladder ([place]) 03/02/2016  . Absolute anemia 02/04/2016  . Airway hyperreactivity 02/04/2016  . Celiac disease 02/04/2016  . Gastric catarrh 02/04/2016  . Acid reflux 02/04/2016  . Combined fat and carbohydrate induced hyperlipemia 02/04/2016  . C. difficile colitis 01/22/2016  . Urinary obstruction 01/21/2016  . COPD (chronic obstructive pulmonary disease) ([place]) 01/21/2016  . Controlled type 2 diabetes mellitus with stage 4 chronic kidney disease, with long-term current use of insulin ([place]) 01/21/2016  . Essential hypertension 01/21/2016  . Acute renal failure superimposed on stage 4 chronic kidney disease ([place]) 01/20/2016  . Anemia in chronic kidney disease 01/20/2016  . Sepsis ([place]) 12/18/2015  . UTI (lower urinary tract infection) 12/18/2015  . Arthritis of knee, degenerative 05/10/2015  . Knee strain 03/26/2015  . Other intervertebral disc displacement, lumbar region 03/04/2015  . Degenerative arthritis of lumbar spine 03/04/2015  . Injury of tendon of upper extremity 11/12/2014  . Atherosclerosis of abdominal aorta ([place]) 11/02/2014  . Chronic kidney disease, stage IV (severe) ([place]) 11/02/2014  . Obstructive apnea 11/02/2014  . Complete rotator cuff rupture of left shoulder 10/05/2014  . Arthritis of shoulder region, degenerative 10/05/2014  . CAD in native artery 12/08/2013  . Benign essential HTN 12/08/2013  . Type 2 diabetes mellitus ([place]) 12/08/2013    Past Surgical History:  Procedure Laterality Date  . ABDOMINAL HYSTERECTOMY    . CYSTOSCOPY W/ RETROGRADES Bilateral 02/17/2016   Procedure: CYSTOSCOPY WITH RETROGRADE PYELOGRAM;  Surgeon: Hollice Espy, MD;  Location: [facility] ORS;  Service: Urology;  Laterality:  Bilateral;  . CYSTOSCOPY WITH STENT PLACEMENT Left 01/21/2016   Procedure: CYSTOSCOPY WITH double J STENT PLACEMENT;  Surgeon: Franchot Gallo, MD;  Location: [facility] ORS;  Service: Urology;  Laterality: Left;  . KIDNEY SURGERY  01/21/2016   IR NEPHROSTOMY  PLACEMENT LEFT   . PERIPHERAL VASCULAR CATHETERIZATION N/A 04/07/2016   Procedure: Glori Luis Cath Insertion;  Surgeon: Katha Cabal, MD;  Location: [place] CV LAB;  Service: Cardiovascular;  Laterality: N/A;  . ROTATOR CUFF REPAIR     left  . TRANSURETHRAL RESECTION OF BLADDER TUMOR N/A 02/17/2016   Procedure: TRANSURETHRAL RESECTION OF BLADDER TUMOR (TURBT)-LARGE;  Surgeon: Hollice Espy, MD;  Location: [facility] ORS;  Service: Urology;  Laterality: N/A;  . URETEROSCOPY Left 02/17/2016   Procedure: URETEROSCOPY;  Surgeon: Hollice Espy, MD;  Location: [facility] ORS;  Service: Urology;  Laterality: Left;    Prior to Admission medications   Medication Sig Start Date End Date Taking? Authorizing Provider  acetaminophen (TYLENOL) 325 MG tablet Take 2 tablets (650 mg total) by mouth every 6 (six) hours as needed for mild pain. 05/09/16   Nicholes Mango, MD  Albuterol Sulfate (PROAIR RESPICLICK) 706 (90 Base) MCG/ACT AEPB Inhale 2 puffs into the lungs 4 (four) times daily as needed (wheezing).     Historical Provider, MD  atorvastatin (LIPITOR) 40 MG tablet Take 40 mg by mouth daily at 6 PM.     Historical Provider, MD  Cholecalciferol (VITAMIN D-3) 1000 units CAPS Take 1 capsule by mouth every morning.    Historical Provider, MD  docusate sodium (COLACE) 100 MG capsule Take 1 capsule (100 mg total) by mouth 2 (two) times daily as needed for mild constipation. 05/09/16   Nicholes Mango, MD  dronabinol (MARINOL) 2.5 MG capsule Take 1 capsule (2.5 mg total) by mouth 2 (two) times daily before lunch and supper. Patient not taking: Reported on 05/04/2016 03/11/16   Theodoro Grist, MD  esomeprazole (NEXIUM) 40 MG capsule Take 40 mg by mouth daily at 12 noon.    Historical Provider, MD  furosemide (LASIX) 40 MG tablet Take 40 mg by mouth 2 (two) times daily.    Historical Provider, MD  gabapentin (NEURONTIN) 300 MG capsule Take 600 mg by mouth 3 (three) times daily.    Historical Provider, MD  insulin glargine  (LANTUS) 100 unit/mL SOPN Inject into the skin at bedtime. 38 units every am & 36 units every evening    Historical Provider, MD  lidocaine-prilocaine (EMLA) cream Apply 1 application topically as needed. Patient not taking: Reported on 05/04/2016 04/08/16   Cammie Sickle, MD  mirabegron ER (MYRBETRIQ) 25 MG TB24 tablet Take 1 tablet (25 mg total) by mouth daily. 04/08/16   Noreene Filbert, MD  NON FORMULARY Place 1 Units into the nose at bedtime. CPAP Time of use 2100-0600    Historical Provider, MD  oxybutynin (DITROPAN) 5 MG tablet Take 1 tablet (5 mg total) by mouth every 8 (eight) hours as needed for bladder spasms. 05/09/16   Nicholes Mango, MD  traMADol (ULTRAM) 50 MG tablet Take 1 tablet (50 mg total) by mouth every 8 (eight) hours as needed. 05/09/16   Nicholes Mango, MD    Allergies Percocet [oxycodone-acetaminophen]  Family History  Problem Relation Age of Onset  . Diabetes Mother   . Cancer Father   . Breast cancer Neg Hx   . Kidney disease Neg Hx     Social History Social History  Substance Use Topics  . Smoking status: Former Smoker  Quit date: 07/20/1993  . Smokeless tobacco: Never Used     Comment: 01/22/2016   "  quit smoking many years ago "  . Alcohol use No    Review of Systems Constitutional: No fever/chills Eyes: No visual changes. ENT: No sore throat. Cardiovascular: Denies chest pain. Respiratory: Denies shortness of breath. Gastrointestinal: No abdominal pain.  No nausea, no vomiting.  No diarrhea.  No constipation. Genitourinary: Negative for dysuria. Musculoskeletal: Negative for back pain. Skin: Negative for rash. Neurological: Negative for headaches, focal weakness or numbness.  10-point ROS otherwise negative.  ____________________________________________   PHYSICAL EXAM:  VITAL SIGNS: ED Triage Vitals  Enc Vitals Group     BP 05/11/16 1210 (!) 99/47     Pulse Rate 05/11/16 1210 99     Resp 05/11/16 1210 18     Temp 05/11/16 1210 97.6  F (36.4 C)     Temp Source 05/11/16 1210 Oral     SpO2 05/11/16 1210 100 %     Weight 05/11/16 1211 170 lb (77.1 kg)     Height 05/11/16 1211 5' 8"  (1.727 m)     Head Circumference --      Peak Flow --      Pain Score 05/11/16 1218 6     Pain Loc --      Pain Edu? --      Excl. in [place]? --     Constitutional: Alert and oriented. Well appearing and in no acute distress. Eyes: Conjunctivae are normal. PERRL. EOMI. Head: Atraumatic. Nose: No congestion/rhinnorhea. Mouth/Throat: Mucous membranes are moist.   Neck: No stridor.   Cardiovascular: Normal rate, regular rhythm. Grossly normal heart sounds.  Respiratory: Normal respiratory effort.  No retractions. Lungs CTAB. Gastrointestinal: Soft and nontender. No distention. No CVA tenderness. Genitourinary:  Normal external exam. Wearing depends with yellow urine in it. No active bleeding. Musculoskeletal: No lower extremity tenderness nor edema.  No joint effusions. Neurologic:  Normal speech and language. No gross focal neurologic deficits are appreciated.  Skin:  Skin is warm, dry and intact. No rash noted. Psychiatric: Mood and affect are normal. Speech and behavior are normal.  ____________________________________________   LABS (all labs ordered are listed, but only abnormal results are displayed)  Labs Reviewed - No data to display ____________________________________________  EKG   ____________________________________________  RADIOLOGY   ____________________________________________   PROCEDURES  Procedure(s) performed:   Procedures  Critical Care performed:   ____________________________________________   INITIAL IMPRESSION / ASSESSMENT AND PLAN / ED COURSE  Pertinent labs & imaging results that were available during my care of the patient were reviewed by me and considered in my medical decision making (see chart for details).  ----------------------------------------- 4:49 PM on  05/11/2016 -----------------------------------------  I discussed the case with Dr. Erskin Burnet of urology including that the patient was able to urinate after her Foley out and we did a postvoid residual which was 96 mL. The patient is no distress at this time. Neurology agrees with leaving the Foley out and having the patient follow up tomorrow at her appointment. I extended plans with the Foley out as well as follow-up with the urologist tomorrow to the patient as well as her husband was at bedside and they're understanding of what to comply. Will be discharged home.  Clinical Course     ____________________________________________   FINAL CLINICAL IMPRESSION(S) / ED DIAGNOSES  Accidental Foley catheter removal.    NEW MEDICATIONS STARTED DURING THIS VISIT:  New Prescriptions   No medications on  file     Note:  This document was prepared using Dragon voice recognition software and may include unintentional dictation errors.    Orbie Pyo, MD 05/11/16 [phone]

## 2016-05-12 ENCOUNTER — Inpatient Hospital Stay
Admission: AD | Admit: 2016-05-12 | Discharge: 2016-05-15 | DRG: 988 | Disposition: A | Discharge status: Home Health | Payer: Medicare Other | Source: Ambulatory Visit | Attending: Specialist | Admitting: Specialist

## 2016-05-12 ENCOUNTER — Ambulatory Visit: Payer: Medicare Other

## 2016-05-12 ENCOUNTER — Ambulatory Visit: Payer: Medicare Other | Admitting: Anesthesiology

## 2016-05-12 ENCOUNTER — Encounter: Payer: Self-pay | Admitting: *Deleted

## 2016-05-12 ENCOUNTER — Encounter
Admission: AD | Disposition: A | Discharge status: Home / Self Care | Payer: Self-pay | Source: Ambulatory Visit | Attending: Specialist

## 2016-05-12 DIAGNOSIS — N179 Acute kidney failure, unspecified: Secondary | ICD-10-CM | POA: Diagnosis present

## 2016-05-12 DIAGNOSIS — Z923 Personal history of irradiation: Secondary | ICD-10-CM

## 2016-05-12 DIAGNOSIS — E875 Hyperkalemia: Principal | ICD-10-CM | POA: Diagnosis present

## 2016-05-12 DIAGNOSIS — E872 Acidosis: Secondary | ICD-10-CM | POA: Diagnosis present

## 2016-05-12 DIAGNOSIS — M6281 Muscle weakness (generalized): Secondary | ICD-10-CM

## 2016-05-12 DIAGNOSIS — R262 Difficulty in walking, not elsewhere classified: Secondary | ICD-10-CM

## 2016-05-12 DIAGNOSIS — Y733 Surgical instruments, materials and gastroenterology and urology devices (including sutures) associated with adverse incidents: Secondary | ICD-10-CM | POA: Diagnosis present

## 2016-05-12 DIAGNOSIS — T839XXA Unspecified complication of genitourinary prosthetic device, implant and graft, initial encounter: Secondary | ICD-10-CM | POA: Diagnosis present

## 2016-05-12 DIAGNOSIS — N184 Chronic kidney disease, stage 4 (severe): Secondary | ICD-10-CM | POA: Diagnosis present

## 2016-05-12 DIAGNOSIS — K0889 Other specified disorders of teeth and supporting structures: Secondary | ICD-10-CM | POA: Diagnosis present

## 2016-05-12 DIAGNOSIS — Z885 Allergy status to narcotic agent status: Secondary | ICD-10-CM

## 2016-05-12 DIAGNOSIS — Z809 Family history of malignant neoplasm, unspecified: Secondary | ICD-10-CM | POA: Diagnosis not present

## 2016-05-12 DIAGNOSIS — E1140 Type 2 diabetes mellitus with diabetic neuropathy, unspecified: Secondary | ICD-10-CM | POA: Diagnosis present

## 2016-05-12 DIAGNOSIS — M1990 Unspecified osteoarthritis, unspecified site: Secondary | ICD-10-CM | POA: Diagnosis present

## 2016-05-12 DIAGNOSIS — N1330 Unspecified hydronephrosis: Secondary | ICD-10-CM | POA: Diagnosis not present

## 2016-05-12 DIAGNOSIS — Y92009 Unspecified place in unspecified non-institutional (private) residence as the place of occurrence of the external cause: Secondary | ICD-10-CM

## 2016-05-12 DIAGNOSIS — Z9221 Personal history of antineoplastic chemotherapy: Secondary | ICD-10-CM | POA: Diagnosis not present

## 2016-05-12 DIAGNOSIS — Z87891 Personal history of nicotine dependence: Secondary | ICD-10-CM | POA: Diagnosis not present

## 2016-05-12 DIAGNOSIS — G4733 Obstructive sleep apnea (adult) (pediatric): Secondary | ICD-10-CM | POA: Diagnosis present

## 2016-05-12 DIAGNOSIS — I129 Hypertensive chronic kidney disease with stage 1 through stage 4 chronic kidney disease, or unspecified chronic kidney disease: Secondary | ICD-10-CM | POA: Diagnosis present

## 2016-05-12 DIAGNOSIS — E669 Obesity, unspecified: Secondary | ICD-10-CM | POA: Diagnosis present

## 2016-05-12 DIAGNOSIS — C679 Malignant neoplasm of bladder, unspecified: Secondary | ICD-10-CM | POA: Diagnosis present

## 2016-05-12 DIAGNOSIS — I739 Peripheral vascular disease, unspecified: Secondary | ICD-10-CM | POA: Diagnosis present

## 2016-05-12 DIAGNOSIS — D631 Anemia in chronic kidney disease: Secondary | ICD-10-CM | POA: Diagnosis present

## 2016-05-12 DIAGNOSIS — J449 Chronic obstructive pulmonary disease, unspecified: Secondary | ICD-10-CM | POA: Diagnosis present

## 2016-05-12 DIAGNOSIS — N3090 Cystitis, unspecified without hematuria: Secondary | ICD-10-CM | POA: Diagnosis present

## 2016-05-12 DIAGNOSIS — K219 Gastro-esophageal reflux disease without esophagitis: Secondary | ICD-10-CM | POA: Diagnosis present

## 2016-05-12 DIAGNOSIS — Z794 Long term (current) use of insulin: Secondary | ICD-10-CM

## 2016-05-12 DIAGNOSIS — Z6825 Body mass index (BMI) 25.0-25.9, adult: Secondary | ICD-10-CM

## 2016-05-12 DIAGNOSIS — E785 Hyperlipidemia, unspecified: Secondary | ICD-10-CM | POA: Diagnosis present

## 2016-05-12 DIAGNOSIS — I2510 Atherosclerotic heart disease of native coronary artery without angina pectoris: Secondary | ICD-10-CM | POA: Diagnosis present

## 2016-05-12 DIAGNOSIS — Z79891 Long term (current) use of opiate analgesic: Secondary | ICD-10-CM

## 2016-05-12 DIAGNOSIS — Z7951 Long term (current) use of inhaled steroids: Secondary | ICD-10-CM

## 2016-05-12 DIAGNOSIS — R32 Unspecified urinary incontinence: Secondary | ICD-10-CM | POA: Diagnosis present

## 2016-05-12 DIAGNOSIS — E1122 Type 2 diabetes mellitus with diabetic chronic kidney disease: Secondary | ICD-10-CM

## 2016-05-12 HISTORY — DX: Dyspnea, unspecified: R06.00

## 2016-05-12 HISTORY — PX: CYSTOSCOPY W/ URETERAL STENT PLACEMENT: SHX1429

## 2016-05-12 HISTORY — DX: Malignant (primary) neoplasm, unspecified: C80.1

## 2016-05-12 HISTORY — DX: Gastro-esophageal reflux disease without esophagitis: K21.9

## 2016-05-12 LAB — BASIC METABOLIC PANEL
ANION GAP: 9 (ref 5–15)
BUN: 83 mg/dL — ABNORMAL HIGH (ref 6–20)
CO2: 16 mmol/L — AB (ref 22–32)
Calcium: 8.3 mg/dL — ABNORMAL LOW (ref 8.9–10.3)
Chloride: 114 mmol/L — ABNORMAL HIGH (ref 101–111)
Creatinine, Ser: 4.83 mg/dL — ABNORMAL HIGH (ref 0.44–1.00)
GFR calc Af Amer: 9 mL/min — ABNORMAL LOW (ref >60)
GFR calc non Af Amer: 8 mL/min — ABNORMAL LOW (ref >60)
GLUCOSE: 109 mg/dL — AB (ref 65–99)
POTASSIUM: 6 mmol/L — AB (ref 3.5–5.1)
Sodium: 139 mmol/L (ref 135–145)

## 2016-05-12 LAB — GLUCOSE, CAPILLARY
GLUCOSE-CAPILLARY: 93 mg/dL (ref 65–99)
GLUCOSE-CAPILLARY: 117 mg/dL — AB (ref 65–99)
Glucose-Capillary: 108 mg/dL — ABNORMAL HIGH (ref 65–99)
Glucose-Capillary: 117 mg/dL — ABNORMAL HIGH (ref 65–99)

## 2016-05-12 LAB — FERRITIN: FERRITIN: 1344 ng/mL — AB (ref 11–307)

## 2016-05-12 LAB — IRON AND TIBC
Iron: 58 ug/dL (ref 28–170)
Saturation Ratios: 38 % — ABNORMAL HIGH (ref 10.4–31.8)
TIBC: 153 ug/dL — ABNORMAL LOW (ref 250–450)
UIBC: 95 ug/dL

## 2016-05-12 LAB — CBC
HEMATOCRIT: 16.8 % — AB (ref 35.0–47.0)
Hemoglobin: 5.3 g/dL — ABNORMAL LOW (ref 12.0–16.0)
MCH: 26.9 pg (ref 26.0–34.0)
MCHC: 31.4 g/dL — ABNORMAL LOW (ref 32.0–36.0)
MCV: 85.6 fL (ref 80.0–100.0)
Platelets: 432 10*3/uL (ref 150–440)
RBC: 1.97 MIL/uL — AB (ref 3.80–5.20)
RDW: 17.3 % — AB (ref 11.5–14.5)
WBC: 14.1 10*3/uL — AB (ref 3.6–11.0)

## 2016-05-12 LAB — PREPARE RBC (CROSSMATCH)

## 2016-05-12 LAB — POCT I-STAT 4, (NA,K, GLUC, HGB,HCT)
Glucose, Bld: 102 mg/dL — ABNORMAL HIGH (ref 65–99)
HCT: 15 % — ABNORMAL LOW (ref 36.0–46.0)
Hemoglobin: 5.1 g/dL — CL (ref 12.0–15.0)
POTASSIUM: 5.7 mmol/L — AB (ref 3.5–5.1)
Sodium: 141 mmol/L (ref 135–145)

## 2016-05-12 SURGERY — CYSTOSCOPY, FLEXIBLE, WITH STENT REPLACEMENT
Anesthesia: General | Wound class: Clean Contaminated

## 2016-05-12 MED ORDER — FENTANYL CITRATE (PF) 100 MCG/2ML IJ SOLN
INTRAMUSCULAR | Status: DC | PRN
  Administered 2016-05-12 (×2): 50 ug via INTRAVENOUS

## 2016-05-12 MED ORDER — BELLADONNA ALKALOIDS-OPIUM 16.2-60 MG RE SUPP
RECTAL | Status: AC
  Filled 2016-05-12: qty 1

## 2016-05-12 MED ORDER — GLYCOPYRROLATE 0.2 MG/ML IJ SOLN
INTRAMUSCULAR | Status: DC | PRN
  Administered 2016-05-12: 0.6 mg via INTRAVENOUS

## 2016-05-12 MED ORDER — PROPOFOL 10 MG/ML IV BOLUS
INTRAVENOUS | Status: DC | PRN
  Administered 2016-05-12: 100 mg via INTRAVENOUS

## 2016-05-12 MED ORDER — FENTANYL CITRATE (PF) 100 MCG/2ML IJ SOLN
25.0000 ug | INTRAMUSCULAR | Status: DC | PRN
  Administered 2016-05-12 (×4): 25 ug via INTRAVENOUS

## 2016-05-12 MED ORDER — ONDANSETRON HCL 4 MG/2ML IJ SOLN
INTRAMUSCULAR | Status: DC | PRN
  Administered 2016-05-12: 4 mg via INTRAVENOUS

## 2016-05-12 MED ORDER — SODIUM CHLORIDE 0.9% FLUSH
3.0000 mL | Freq: Two times a day (BID) | INTRAVENOUS | Status: DC
  Administered 2016-05-12: 3 mL via INTRAVENOUS

## 2016-05-12 MED ORDER — PANTOPRAZOLE SODIUM 40 MG PO TBEC
40.0000 mg | DELAYED_RELEASE_TABLET | Freq: Every day | ORAL | Status: DC
  Administered 2016-05-12 – 2016-05-15 (×4): 40 mg via ORAL
  Filled 2016-05-12 (×3): qty 1

## 2016-05-12 MED ORDER — IOTHALAMATE MEGLUMINE 43 % IV SOLN
INTRAVENOUS | Status: DC | PRN
  Administered 2016-05-12: 15 mL

## 2016-05-12 MED ORDER — ACETAMINOPHEN 325 MG PO TABS
650.0000 mg | ORAL_TABLET | Freq: Four times a day (QID) | ORAL | Status: DC | PRN
  Administered 2016-05-12 – 2016-05-13 (×2): 650 mg via ORAL
  Filled 2016-05-12 (×2): qty 2

## 2016-05-12 MED ORDER — SODIUM CHLORIDE 0.9 % IV SOLN
INTRAVENOUS | Status: DC
  Administered 2016-05-12: 50 mL/h via INTRAVENOUS

## 2016-05-12 MED ORDER — NEOSTIGMINE METHYLSULFATE 10 MG/10ML IV SOLN
INTRAVENOUS | Status: DC | PRN
  Administered 2016-05-12: 4 mg via INTRAVENOUS

## 2016-05-12 MED ORDER — ROCURONIUM BROMIDE 100 MG/10ML IV SOLN
INTRAVENOUS | Status: DC | PRN
Start: 2016-05-12 — End: 2016-05-12
  Administered 2016-05-12: 30 mg via INTRAVENOUS

## 2016-05-12 MED ORDER — INSULIN ASPART 100 UNIT/ML ~~LOC~~ SOLN: 0.0000 [IU] | Freq: Every day | SUBCUTANEOUS | Status: DC

## 2016-05-12 MED ORDER — SODIUM CHLORIDE 0.9 % IV SOLN
INTRAVENOUS | Status: DC
  Administered 2016-05-13: 01:00:00 via INTRAVENOUS

## 2016-05-12 MED ORDER — OXYBUTYNIN CHLORIDE 5 MG PO TABS
5.0000 mg | ORAL_TABLET | Freq: Three times a day (TID) | ORAL | Status: DC | PRN
  Administered 2016-05-14 – 2016-05-15 (×2): 5 mg via ORAL
  Filled 2016-05-12 (×2): qty 1

## 2016-05-12 MED ORDER — CEFAZOLIN SODIUM-DEXTROSE 2-4 GM/100ML-% IV SOLN
2.0000 g | INTRAVENOUS | Status: AC
  Administered 2016-05-12: 2 g via INTRAVENOUS

## 2016-05-12 MED ORDER — TRAMADOL HCL 50 MG PO TABS
50.0000 mg | ORAL_TABLET | Freq: Two times a day (BID) | ORAL | Status: DC
  Administered 2016-05-12 – 2016-05-14 (×3): 50 mg via ORAL
  Filled 2016-05-12 (×3): qty 1

## 2016-05-12 MED ORDER — BELLADONNA ALKALOIDS-OPIUM 16.2-60 MG RE SUPP
1.0000 | Freq: Once | RECTAL | Status: AC
  Administered 2016-05-12: 1 via RECTAL

## 2016-05-12 MED ORDER — SODIUM CHLORIDE 0.9 % IV SOLN
Freq: Once | INTRAVENOUS | Status: AC
  Administered 2016-05-12: 19:00:00 via INTRAVENOUS

## 2016-05-12 MED ORDER — ONDANSETRON HCL 4 MG/2ML IJ SOLN: 4.0000 mg | Freq: Four times a day (QID) | INTRAMUSCULAR | Status: DC | PRN

## 2016-05-12 MED ORDER — PATIROMER SORBITEX CALCIUM 8.4 G PO PACK
8.4000 g | PACK | Freq: Every day | ORAL | Status: DC
  Administered 2016-05-12 – 2016-05-14 (×3): 8.4 g via ORAL
  Filled 2016-05-12 (×5): qty 4

## 2016-05-12 MED ORDER — LIDOCAINE HCL (CARDIAC) 20 MG/ML IV SOLN
INTRAVENOUS | Status: DC | PRN
  Administered 2016-05-12: 80 mg via INTRAVENOUS

## 2016-05-12 MED ORDER — ACETAMINOPHEN 650 MG RE SUPP
650.0000 mg | Freq: Four times a day (QID) | RECTAL | Status: DC | PRN
  Filled 2016-05-12: qty 1

## 2016-05-12 MED ORDER — DOCUSATE SODIUM 100 MG PO CAPS
100.0000 mg | ORAL_CAPSULE | Freq: Two times a day (BID) | ORAL | Status: DC | PRN
  Administered 2016-05-14 (×2): 100 mg via ORAL
  Filled 2016-05-12 (×2): qty 1

## 2016-05-12 MED ORDER — ALBUTEROL SULFATE (2.5 MG/3ML) 0.083% IN NEBU: 3.0000 mL | INHALATION_SOLUTION | Freq: Four times a day (QID) | RESPIRATORY_TRACT | Status: DC | PRN

## 2016-05-12 MED ORDER — FENTANYL CITRATE (PF) 100 MCG/2ML IJ SOLN
INTRAMUSCULAR | Status: AC
  Filled 2016-05-12: qty 2

## 2016-05-12 MED ORDER — GABAPENTIN 300 MG PO CAPS
600.0000 mg | ORAL_CAPSULE | Freq: Every day | ORAL | Status: DC
  Administered 2016-05-12 – 2016-05-14 (×3): 600 mg via ORAL
  Filled 2016-05-12 (×3): qty 2

## 2016-05-12 MED ORDER — VITAMIN D-3 25 MCG (1000 UT) PO CAPS
1.0000 | ORAL_CAPSULE | Freq: Every morning | ORAL | Status: DC
  Administered 2016-05-13 – 2016-05-14 (×2): 1000 [IU] via ORAL
  Filled 2016-05-12 (×3): qty 1

## 2016-05-12 MED ORDER — INSULIN GLARGINE 100 UNIT/ML ~~LOC~~ SOLN
15.0000 [IU] | Freq: Every day | SUBCUTANEOUS | Status: DC
  Administered 2016-05-12 – 2016-05-14 (×3): 15 [IU] via SUBCUTANEOUS
  Filled 2016-05-12 (×5): qty 0.15

## 2016-05-12 MED ORDER — ATORVASTATIN CALCIUM 20 MG PO TABS
40.0000 mg | ORAL_TABLET | Freq: Every day | ORAL | Status: DC
  Administered 2016-05-12 – 2016-05-14 (×3): 40 mg via ORAL
  Filled 2016-05-12 (×3): qty 2

## 2016-05-12 MED ORDER — PHENYLEPHRINE HCL 10 MG/ML IJ SOLN
INTRAMUSCULAR | Status: DC | PRN
  Administered 2016-05-12 (×3): 100 ug via INTRAVENOUS

## 2016-05-12 MED ORDER — ONDANSETRON HCL 4 MG/2ML IJ SOLN: 4.0000 mg | Freq: Once | INTRAMUSCULAR | Status: DC | PRN

## 2016-05-12 MED ORDER — ONDANSETRON HCL 4 MG PO TABS: 4.0000 mg | ORAL_TABLET | Freq: Four times a day (QID) | ORAL | Status: DC | PRN

## 2016-05-12 MED ORDER — INSULIN ASPART 100 UNIT/ML ~~LOC~~ SOLN: 0.0000 [IU] | Freq: Three times a day (TID) | SUBCUTANEOUS | Status: DC

## 2016-05-12 MED ORDER — CEFAZOLIN SODIUM-DEXTROSE 2-4 GM/100ML-% IV SOLN
INTRAVENOUS | Status: AC
  Administered 2016-05-12: 2 g via INTRAVENOUS
  Filled 2016-05-12: qty 100

## 2016-05-12 SURGICAL SUPPLY — 38 items
BAG DRAIN CYSTO-URO LG1000N (MISCELLANEOUS) ×4 IMPLANT
BAG URO DRAIN 2000ML W/SPOUT (MISCELLANEOUS) ×4 IMPLANT
CATH FOL 2WAY LX 16X5 (CATHETERS) IMPLANT
CATH FOLEY 2WAY  5CC 16FR (CATHETERS) ×2
CATH URETL 5X70 OPEN END (CATHETERS) ×4 IMPLANT
CATH URTH 16FR FL 2W BLN LF (CATHETERS) ×2 IMPLANT
CONRAY 43 FOR UROLOGY 50M (MISCELLANEOUS) ×4 IMPLANT
CORD URO TURP 10FT (MISCELLANEOUS) IMPLANT
DRAPE UTILITY 15X26 TOWEL STRL (DRAPES) ×4 IMPLANT
ELECT LOOP 22F BIPOLAR SML (ELECTROSURGICAL)
ELECT REM PT RETURN 9FT ADLT (ELECTROSURGICAL)
ELECTRODE LOOP 22F BIPOLAR SML (ELECTROSURGICAL) IMPLANT
ELECTRODE REM PT RTRN 9FT ADLT (ELECTROSURGICAL) IMPLANT
GLOVE BIO SURGEON STRL SZ 6.5 (GLOVE) ×3 IMPLANT
GLOVE BIO SURGEONS STRL SZ 6.5 (GLOVE) ×1
GOWN STRL REUS W/ TWL LRG LVL3 (GOWN DISPOSABLE) ×4 IMPLANT
GOWN STRL REUS W/ TWL LRG LVL4 (GOWN DISPOSABLE) ×4 IMPLANT
GOWN STRL REUS W/TWL LRG LVL3 (GOWN DISPOSABLE) ×4
GOWN STRL REUS W/TWL LRG LVL4 (GOWN DISPOSABLE) ×4
HOLDER FOLEY CATH W/STRAP (MISCELLANEOUS) IMPLANT
KIT RM TURNOVER CYSTO AR (KITS) ×4 IMPLANT
LOOP CUT BIPOLAR 24F LRG (ELECTROSURGICAL) IMPLANT
PACK CYSTO AR (MISCELLANEOUS) ×4 IMPLANT
PLUG CATH AND CAP STER (CATHETERS) IMPLANT
PREP PVP WINGED SPONGE (MISCELLANEOUS) IMPLANT
SENSORWIRE 0.038 NOT ANGLED (WIRE) ×4
SET CYSTO W/LG BORE CLAMP LF (SET/KITS/TRAYS/PACK) ×4 IMPLANT
SET IRRIG Y TYPE TUR BLADDER L (SET/KITS/TRAYS/PACK) ×4 IMPLANT
SET IRRIGATING DISP (SET/KITS/TRAYS/PACK) ×4 IMPLANT
SOL .9 NS 3000ML IRR  AL (IV SOLUTION) ×2
SOL .9 NS 3000ML IRR UROMATIC (IV SOLUTION) ×2 IMPLANT
STENT URET 6FRX24 CONTOUR (STENTS) IMPLANT
STENT URET 6FRX26 CONTOUR (STENTS) IMPLANT
STENT URO INLAY 6FRX24CM (STENTS) ×4 IMPLANT
SURGILUBE 2OZ TUBE FLIPTOP (MISCELLANEOUS) ×4 IMPLANT
SYRINGE IRR TOOMEY STRL 70CC (SYRINGE) ×4 IMPLANT
WATER STERILE IRR 1000ML POUR (IV SOLUTION) ×4 IMPLANT
WIRE SENSOR 0.038 NOT ANGLED (WIRE) ×2 IMPLANT

## 2016-05-12 NOTE — Anesthesia Procedure Notes (Signed)
Procedure Name: Intubation Date/Time: 05/12/2016 1:03 PM Performed by: Hedda Slade Pre-anesthesia Checklist: Patient identified, Emergency Drugs available, Suction available, Patient being monitored and Timeout performed Patient Re-evaluated:Patient Re-evaluated prior to inductionOxygen Delivery Method: Circle system utilized Preoxygenation: Pre-oxygenation with 100% oxygen Intubation Type: IV induction Ventilation: Mask ventilation without difficulty Laryngoscope Size: Mac and 3 Grade View: Grade I Tube type: Oral Tube size: 7.0 mm Airway Equipment and Method: Stylet Placement Confirmation: ETT inserted through vocal cords under direct vision,  positive ETCO2,  CO2 detector and breath sounds checked- equal and bilateral Secured at: 21 cm Tube secured with: Tape Dental Injury: Teeth and Oropharynx as per pre-operative assessment

## 2016-05-12 NOTE — Anesthesia Preprocedure Evaluation (Addendum)
Anesthesia Evaluation  Patient identified by MRN, date of birth, ID band Patient awake    Reviewed: Allergy & Precautions, NPO status , Patient's Chart, lab work & pertinent test results  History of Anesthesia Complications Negative for: history of anesthetic complications  Airway Mallampati: III       Dental  (+) Missing, Poor Dentition, Dental Advidsory Given   Pulmonary shortness of breath and with exertion, asthma , sleep apnea and Continuous Positive Airway Pressure Ventilation , COPD,  COPD inhaler, neg recent URI, former smoker,     + decreased breath sounds      Cardiovascular hypertension, (-) angina+ CAD, + Peripheral Vascular Disease and + DOE  (-) Past MI, (-) Cardiac Stents and (-) CABG (-) dysrhythmias (-) Valvular Problems/Murmurs Rhythm:Regular     Neuro/Psych Seizures -,  negative psych ROS   GI/Hepatic Neg liver ROS, GERD  Medicated,  Endo/Other  diabetes, Type 2, Oral Hypoglycemic Agents  Renal/GU CRFRenal disease     Musculoskeletal   Abdominal (+) + obese,   Peds  Hematology  (+) anemia ,   Anesthesia Other Findings Past Medical History: 2017: Anemia associated with chronic renal failure     Comment: blood transfusion last week 10/17 No date: Arthritis 2017: Cancer ([place])     Comment: bladder No date: Chronic kidney disease No date: CKD (chronic kidney disease)     Comment: stage IV kidney disease.  dr. Candiss Norse and dr.               Holley Raring follow her No date: COPD (chronic obstructive pulmonary disease) (* No date: Diabetes mellitus without complication ([place]) No date: Dyspnea     Comment: with exertion No date: GERD (gastroesophageal reflux disease) No date: Hypertension No date: Sleep apnea     Comment: uses cpap 01/2016: Urinary obstruction   Reproductive/Obstetrics                            Anesthesia Physical  Anesthesia Plan  ASA: III  Anesthesia Plan:  General   Post-op Pain Management:    Induction: Intravenous  Airway Management Planned: Oral ETT  Additional Equipment:   Intra-op Plan:   Post-operative Plan: Extubation in OR  Informed Consent: I have reviewed the patients History and Physical, chart, labs and discussed the procedure including the risks, benefits and alternatives for the proposed anesthesia with the patient or authorized representative who has indicated his/her understanding and acceptance.   Dental Advisory Given  Plan Discussed with: CRNA, Anesthesiologist and Surgeon  Anesthesia Plan Comments:         Anesthesia Quick Evaluation

## 2016-05-12 NOTE — Interval H&P Note (Signed)
History and Physical Interval Note:  05/12/2016 11:50 AM  [place]  has presented today for surgery, with the diagnosis of left hydronephrosis,bladder cancer  The various methods of treatment have been discussed with the patient and family. After consideration of risks, benefits and other options for treatment, the patient has consented to  Procedure(s): CYSTOSCOPY WITH STENT REPLACEMENT (Left) TRANSURETHRAL RESECTION OF BLADDER TUMOR (TURBT) (N/A) as a surgical intervention .  The patient's history has been reviewed, patient examined, no change in status, stable for surgery.  I have reviewed the patient's chart and labs.  Questions were answered to the patient's satisfaction.    RRR CTAB  Hollice Espy

## 2016-05-12 NOTE — H&P (Signed)
[facility] NAME: Barbara [name]    MR#:  [id number]  DATE OF BIRTH:  1941-01-22   DATE OF ADMISSION:  05/12/2016  PRIMARY CARE PHYSICIAN: Madelyn Brunner, MD   REQUESTING/REFERRING PHYSICIAN: Erlene Quan  CHIEF COMPLAINT:  Abnormal blood work  HISTORY OF PRESENT ILLNESS:  Barbara [name]  is a 76 y.o. female with a known history of Bladder cancer, stage IV chronic kidney disease who is presenting originally for urological procedure in regards to hydronephrosis-underwent cystoscopy with left ureteral stent exchange and retrograde pyelogram without complication. No gross tumor was noted in the bladder, estimated blood loss minimal. However, on basic blood work and noted to have hyperkalemia potassium of 5.7 as well as anemia with a hemoglobin of 5.1. She has had a blood transfusion about a week ago for anemia related to chronic kidney disease. I was asked to see the patient in regards to abnormal blood work. Upon my evaluation in PACU the patient is somewhat fatigued but otherwise has no complaints she states she thinks she may have had some blood when wiping but otherwise denies any further bleeding or bruising. No further complaints at this time  PAST MEDICAL HISTORY:   Past Medical History:  Diagnosis Date  . Anemia associated with chronic renal failure 2017   blood transfusion last week 10/17  . Arthritis   . Cancer ([place]) 2017   bladder  . Chronic kidney disease   . 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  dr. Candiss Norse and dr. Holley Raring follow her  . COPD (chronic obstructive pulmonary disease) ([place])   . Diabetes mellitus without complication ([place])   . Dyspnea    with exertion  . GERD (gastroesophageal reflux disease)   . Hypertension   . Sleep apnea    uses cpap  . Urinary obstruction 01/2016    PAST SURGICAL HISTORY:   Past Surgical History:  Procedure Laterality Date  . ABDOMINAL HYSTERECTOMY    . CYSTOSCOPY  W/ RETROGRADES Bilateral 02/17/2016   Procedure: CYSTOSCOPY WITH RETROGRADE PYELOGRAM;  Surgeon: Hollice Espy, MD;  Location: [facility];  Service: Urology;  Laterality: Bilateral;  . CYSTOSCOPY WITH STENT PLACEMENT Left 01/21/2016   Procedure: CYSTOSCOPY WITH double J STENT PLACEMENT;  Surgeon: Franchot Gallo, MD;  Location: [facility];  Service: Urology;  Laterality: Left;  . KIDNEY SURGERY  01/21/2016   IR NEPHROSTOMY PLACEMENT LEFT   . PERIPHERAL VASCULAR CATHETERIZATION N/A 04/07/2016   Procedure: Glori Luis Cath Insertion;  Surgeon: Katha Cabal, MD;  Location: [facility];  Service: Cardiovascular;  Laterality: N/A;  . ROTATOR CUFF REPAIR     both sides  . TRANSURETHRAL RESECTION OF BLADDER TUMOR N/A 02/17/2016   Procedure: TRANSURETHRAL RESECTION OF BLADDER TUMOR (TURBT)-LARGE;  Surgeon: Hollice Espy, MD;  Location: [facility];  Service: Urology;  Laterality: N/A;  . URETEROSCOPY Left 02/17/2016   Procedure: URETEROSCOPY;  Surgeon: Hollice Espy, MD;  Location: [facility];  Service: Urology;  Laterality: Left;    SOCIAL HISTORY:   Social History  Substance Use Topics  . Smoking status: Former Smoker    Types: Cigarettes    Quit date: 07/20/2002  . Smokeless tobacco: Never Used     Comment: 01/22/2016   "  quit smoking many years ago "  . Alcohol use No    FAMILY HISTORY:   Family History  Problem Relation Age of Onset  . Diabetes Mother   . Cancer  Father   . Breast cancer Neg Hx   . Kidney disease Neg Hx     DRUG ALLERGIES:   Allergies  Allergen Reactions  . Percocet [Oxycodone-Acetaminophen] Hives    REVIEW OF SYSTEMS:  REVIEW OF SYSTEMS:  CONSTITUTIONAL: Denies fevers, chills, fatigue, weakness.  EYES: Denies blurred vision, double vision, or eye pain.  EARS, NOSE, THROAT: Denies tinnitus, ear pain, hearing loss.  RESPIRATORY: denies cough, shortness of breath, wheezing  CARDIOVASCULAR: Denies chest pain, palpitations, edema.  GASTROINTESTINAL: Denies  nausea, vomiting, diarrhea, abdominal pain.  GENITOURINARY: Denies dysuria, hematuria.  ENDOCRINE: Denies nocturia or thyroid problems. HEMATOLOGIC AND LYMPHATIC: Denies easy bruising or bleeding.  SKIN: Denies rash or lesions.  MUSCULOSKELETAL: Denies pain in neck, back, shoulder, knees, hips, or further arthritic symptoms.  NEUROLOGIC: Denies paralysis, paresthesias.  PSYCHIATRIC: Denies anxiety or depressive symptoms. Otherwise full review of systems performed by me is negative.   MEDICATIONS AT HOME:   Prior to Admission medications   Medication Sig Start Date End Date Taking? Authorizing Provider  acetaminophen (TYLENOL) 325 MG tablet Take 2 tablets (650 mg total) by mouth every 6 (six) hours as needed for mild pain. 05/09/16  Yes Nicholes Mango, MD  Albuterol Sulfate (PROAIR RESPICLICK) 094 (90 Base) MCG/ACT AEPB Inhale 2 puffs into the lungs 4 (four) times daily as needed (wheezing).    Yes Historical Provider, MD  atorvastatin (LIPITOR) 40 MG tablet Take 40 mg by mouth daily at 6 PM.    Yes Historical Provider, MD  Cholecalciferol (VITAMIN D-3) 1000 units CAPS Take 1 capsule by mouth every morning.   Yes Historical Provider, MD  docusate sodium (COLACE) 100 MG capsule Take 1 capsule (100 mg total) by mouth 2 (two) times daily as needed for mild constipation. 05/09/16  Yes Nicholes Mango, MD  esomeprazole (NEXIUM) 40 MG capsule Take 40 mg by mouth daily at 12 noon.   Yes Historical Provider, MD  furosemide (LASIX) 40 MG tablet Take 40 mg by mouth 2 (two) times daily.   Yes Historical Provider, MD  gabapentin (NEURONTIN) 300 MG capsule Take 600 mg by mouth 3 (three) times daily.   Yes Historical Provider, MD  NON FORMULARY Place 1 Units into the nose at bedtime. CPAP Time of use 2100-0600   Yes Historical Provider, MD  oxybutynin (DITROPAN) 5 MG tablet Take 1 tablet (5 mg total) by mouth every 8 (eight) hours as needed for bladder spasms. 05/09/16  Yes Nicholes Mango, MD  traMADol (ULTRAM) 50  MG tablet Take 1 tablet (50 mg total) by mouth every 8 (eight) hours as needed. 05/09/16  Yes Nicholes Mango, MD  dronabinol (MARINOL) 2.5 MG capsule Take 1 capsule (2.5 mg total) by mouth 2 (two) times daily before lunch and supper. Patient not taking: Reported on 05/12/2016 03/11/16   Theodoro Grist, MD  insulin glargine (LANTUS) 100 unit/mL SOPN Inject into the skin at bedtime. 38 units every am & 36 units every evening    Historical Provider, MD  lidocaine-prilocaine (EMLA) cream Apply 1 application topically as needed. Patient not taking: Reported on 05/12/2016 04/08/16   Cammie Sickle, MD  mirabegron ER (MYRBETRIQ) 25 MG TB24 tablet Take 1 tablet (25 mg total) by mouth daily. Patient not taking: Reported on 05/12/2016 04/08/16   Noreene Filbert, MD      VITAL SIGNS:  Blood pressure (!) 118/54, pulse 72, temperature 97.8 F (36.6 C), resp. rate 16, height 5' 8"  (1.727 m), weight 77.1 kg (170 lb), SpO2 100 %.  PHYSICAL  EXAMINATION:  VITAL SIGNS: Vitals:   05/12/16 1414 05/12/16 1425  BP: (!) 104/55 (!) 118/54  Pulse: 70 72  Resp: 14 16  Temp:     GENERAL:75 y.o.female currently in no acute distress. Evaluated in PACU HEAD: Normocephalic, atraumatic.  EYES: Pupils equal, round, reactive to light. Extraocular muscles intact. No scleral icterus.  MOUTH: Dry mucosal membrane. Dentition intact. No abscess noted.  EAR, NOSE, THROAT: Clear without exudates. No external lesions.  NECK: Supple. No thyromegaly. No nodules. No JVD.  PULMONARY: Clear to ascultation, without wheeze rails or rhonci. No use of accessory muscles, Good respiratory effort. good air entry bilaterally CHEST: Nontender to palpation.  CARDIOVASCULAR: S1 and S2. Regular rate and rhythm. No murmurs, rubs, or gallops. No edema. Pedal pulses 2+ bilaterally.  GASTROINTESTINAL: Soft, nontender, nondistended. No masses. Positive bowel sounds. No hepatosplenomegaly.  MUSCULOSKELETAL: No swelling, clubbing, or edema. Range of  motion full in all extremities.  NEUROLOGIC: Cranial nerves II through XII are intact. No gross focal neurological deficits. Sensation intact. Reflexes intact.  SKIN: No ulceration, lesions, rashes, or cyanosis. Skin warm and dry. Turgor intact.  PSYCHIATRIC: Mood, affect within normal limits. The patient is awake, alert and oriented x 3. Insight, judgment intact.    LABORATORY PANEL:   CBC  Recent Labs Lab 05/12/16 1414  WBC 14.1*  HGB 5.3*  HCT 16.8*  PLT 432   ------------------------------------------------------------------------------------------------------------------  Chemistries   Recent Labs Lab 05/12/16 1414  NA 139  K 6.0*  CL 114*  CO2 16*  GLUCOSE 109*  BUN 83*  CREATININE 4.83*  CALCIUM 8.3*   ------------------------------------------------------------------------------------------------------------------  Cardiac Enzymes No results for input(s): TROPONINI in the last 168 hours. ------------------------------------------------------------------------------------------------------------------  RADIOLOGY:  No results found.  EKG:   Orders placed or performed during the hospital encounter of 05/04/16  . ED EKG within 10 minutes  . ED EKG within 10 minutes  . EKG    IMPRESSION AND PLAN:   75 year old African-American female history of stage IV chronic kidney disease who underwent left ureteral stent exchange asked for medical admission in regards to hyperkalemia and anemia.  1. Hyper kalemia: In setting of chronic kidney disease stage IV, provide IV fluid hydration, start valtessa, follow potassium level, consult nephrology, place on off unit telemetry  2. Anemia, normocytic: This is likely continued anemia of chronic disease however given questionable history of blood loss we'll check fecal occult, iron studies-prior to transfusion, follow CBC, transfuse 2 units packed red blood cell to achieve hemoglobin greater than 7. If evidence of iron  deficiency anemia or positive fecal occult test will get gastroenterology consult, avoid heparin further blood thinners further antiplatelets at this time  3. Stage IV chronic kidney disease: Consult nephrology, further treatment plan as above 4. Type 2 diabetes continue with lower dose of basal insulin as question dietary status, add sliding scale, consult diabetic coordinator 5. Vte: scd given concern for possible bleed    All the records are reviewed and case discussed with ED provider. Management plans discussed with the patient, family and they are in agreement.  CODE STATUS: Full code for patient, will reassess when she is slightly less groggy  TOTAL TIME TAKING CARE OF THIS PATIENT: 33 minutes.    Barbara [name],  [name].D on 05/12/2016 at 2:43 PM  Between 7am to 6pm - Pager - [phone]  After 6pm: House Pager: - [facility]  Office  [phone]  CC: Primary care physician; Madelyn Brunner, MD

## 2016-05-12 NOTE — Progress Notes (Signed)
[facility]  ROUNDING NOTE   Subjective:   Admitted today for hyperkalemia status post cystoscopy by Dr. Erlene Quan.   Started on IV NS at 74   Objective:  Vital signs in last 24 hours:  Temp:  [96.4 F (35.8 C)-97.8 F (36.6 C)] 97.8 F (36.6 C) (10/24 1343) Pulse Rate:  [70-96] 79 (10/24 1549) Resp:  [14-18] 18 (10/24 1549) BP: (104-139)/(47-61) 129/53 (10/24 1549) SpO2:  [100 %] 100 % (10/24 1549) Weight:  [77.1 kg (170 lb)] 77.1 kg (170 lb) (10/24 1105)  Weight change:  Filed Weights   05/12/16 1105  Weight: 77.1 kg (170 lb)    Intake/Output: No intake/output data recorded.   Intake/Output this shift:  Total I/O In: 100 [I.V.:100] Out: 2 [Blood:2]  Physical Exam: General: No acute distress  Head: Normocephalic, atraumatic. Moist oral mucosal membranes  Eyes: Anicteric  Neck: Supple, trachea midline  Lungs:  Clear to auscultation, normal effort  Heart: S1S2 no rubs  Abdomen:  Soft, nontender, BS present  Extremities: some peripheral edema.  Neurologic: Nonfocal, moving all four extremities  Skin: No lesions  GU: Foley catheter in place, no hematuria now    Basic Metabolic Panel:  Recent Labs Lab 05/06/16 0615 05/07/16 0500 05/08/16 0550 05/09/16 0500 05/12/16 1233 05/12/16 1414  NA 135 139 138 139 141 139  K 4.9 4.5 4.9 5.0 5.7* 6.0*  CL 113* 118* 118* 117*  --  114*  CO2 15* 15* 15* 16*  --  16*  GLUCOSE 100* 84 89 106* 102* 109*  BUN 65* 60* 57* 55*  --  83*  CREATININE 4.92* 4.52* 4.53* 4.12*  --  4.83*  CALCIUM 8.2* 8.2* 8.4* 8.4*  --  8.3*    Liver Function Tests: No results for input(s): AST, ALT, ALKPHOS, BILITOT, PROT, ALBUMIN in the last 168 hours. No results for input(s): LIPASE, AMYLASE in the last 168 hours. No results for input(s): AMMONIA in the last 168 hours.  CBC:  Recent Labs Lab 05/06/16 0615 05/07/16 0500 05/08/16 0550 05/09/16 0500 05/12/16 1233 05/12/16 1414  WBC 6.6 5.9 8.0 9.8  --  14.1*  HGB  7.0* 6.5* 7.5* 7.2* 5.1* 5.3*  HCT 21.4* 20.3* 22.9* 21.9* 15.0* 16.8*  MCV 82.9 82.9 83.9 83.2  --  85.6  PLT 140* 139* 172 219  --  432    Cardiac Enzymes: No results for input(s): CKTOTAL, CKMB, CKMBINDEX, TROPONINI in the last 168 hours.  BNP: Invalid input(s): POCBNP  CBG:  Recent Labs Lab 05/08/16 0720 05/08/16 1145 05/09/16 0750 05/12/16 1052 05/12/16 1344  GLUCAP 87 121* 104* 93 108*    Microbiology: Results for orders placed or performed in visit on 04/23/16  Urine culture     Status: None   Collection Time: 04/23/16 11:38 AM  Result Value Ref Range Status   Specimen Description URINE, CLEAN CATCH  Final   Special Requests NONE  Final   Culture NO GROWTH Performed at [facility]   Final   Report Status 04/24/2016 FINAL  Final    Coagulation Studies: No results for input(s): LABPROT, INR in the last 72 hours.  Urinalysis: No results for input(s): COLORURINE, LABSPEC, PHURINE, GLUCOSEU, HGBUR, BILIRUBINUR, KETONESUR, PROTEINUR, UROBILINOGEN, NITRITE, LEUKOCYTESUR in the last 72 hours.  Invalid input(s): APPERANCEUR    Imaging: No results found.   Medications:   . sodium chloride     . sodium chloride   Intravenous Once  . atorvastatin  40 mg Oral q1800  . fentaNYL      .  gabapentin  600 mg Oral QHS  . insulin aspart  0-5 Units Subcutaneous QHS  . insulin aspart  0-9 Units Subcutaneous TID WC  . insulin glargine  15 Units Subcutaneous QHS  . opium-belladonna      . pantoprazole  40 mg Oral Daily  . patiromer  8.4 g Oral Daily  . sodium chloride flush  3 mL Intravenous Q12H  . traMADol  50 mg Oral Q12H  . [START ON 05/13/2016] Vitamin D-3  1 capsule Oral q morning - 10a   acetaminophen **OR** acetaminophen, albuterol, docusate sodium, ondansetron **OR** ondansetron (ZOFRAN) IV, oxybutynin  Assessment/ Plan:  75 y.o.black female with long-standing hypertension, diabetes type 2, hyperlipidemia, osteoarthritis, obstructive sleep apnea,  bladder cancer 8/17 s/p TURBT and radiation therapy/chemo with gemcitabine.  1. Acute renal failure on chronic kidney disease stage IV with hyperkalemia: status post cystoscopy.  - Started on IVF; NS at 43 - Low threshold for hemodialysis.  - check labs in morning  2. Anemia of chronic kidney disease. Hemoglobin 5.3 - 2 units PRBC ordered  - Avoiding epogen due to tumor    LOS: 0 Jere Vanburen 10/24/20174:29 PM

## 2016-05-12 NOTE — OR Nursing (Signed)
Potassium obtained via port after obtaining and wasting 10cc of blood.  Resulting lab is 5.7.  Dr. Rosey Bath aware and found this number is acceptable due to patients history of stage IV renal disease.

## 2016-05-12 NOTE — Op Note (Addendum)
Date of procedure: 05/12/16  Preoperative diagnosis:  1. History of muscle invasive bladder cancer 2. Left hydronephrosis 3. Acute on chronic kidney disease   Postoperative diagnosis:  1. Same as above   Procedure: 1. Cystoscopy 2. Left ureteral stent exchange 3. Left retrograde pyelogram  Surgeon: Hollice Espy, MD  Anesthesia: General  Complications: None  Intraoperative findings: No obvious gross tumor in the bladder, left UO widely patent. Retrograde pyelogram with no evidence of hydroureteronephrosis. No stent encrustation.  EBL: Minimal  Specimens: None  Drains: 6 x 24 French double-J ureteral stent, Bard Optima  Indication: Barbara [name] is a 75 y.o. patient with history of muscle invasive bladder cancer status post recent chemotherapy and radiation admitted last rate with acute on chronic renal failure and mild left pelviectasis despite indwelling stent. Her creatinine improves spontaneously with placement of a Foley catheter but there was concern for ongoing obstructive component to her renal failure.  As such, she presents today for left ureteral stent exchange with the possibility of TURBT of any gross tumors identified.  She did have a Foley catheter up until yesterday when it dislodged spontaneously with the balloon fully inflated and was not replaced.  After reviewing the management options for treatment, she elected to proceed with the above surgical procedure(s). We have discussed the potential benefits and risks of the procedure, side effects of the proposed treatment, the likelihood of the patient achieving the goals of the procedure, and any potential problems that might occur during the procedure or recuperation. Informed consent has been obtained.  Description of procedure:  The patient was taken to the operating room and general anesthesia was induced.  The patient was placed in the dorsal lithotomy position, prepped and draped in the usual sterile fashion,  and preoperative antibiotics were administered. A preoperative time-out was performed.   On examination, she was noted to have a capacious urethral meatus and the ureteral stent was seen just outside the meatus.  On fluoroscopy, did appear that the proximal coil was still located at the level of the renal pelvis. The distal coil of this was grasped and the stent was cannulated with a sensor wire up to level of the kidney removing the stent and leaving the wire in place. This was passed off the field. Of note, the stent appeared to have minimal encrustation and appeared to be patent.  A 21 French scope was then advanced per urethra into the bladder. Initially, there was some cloudy material which was irrigated out. Upon inspection, there was some mild catheter cystitis but no obvious gross tumor within the bladder. The left hemitrigone was in normal anatomic position with a widely patent, capacious appearing ureteral orifice without evidence of gross tumor or stenosis. A 5 French open-ended ureteral catheter was advanced alongside the wire just within the UO and a retrograde pyelogram was performed. This revealed a normal caliber ureter and no obvious hydroureteronephrosis. There did not appear to be any degree of obstruction. Next, a 6 x 24 French Bard Optima stent was advanced up to level of the renal pelvis. The wire was partially drawn until full coil was noted within the renal pelvis. The wire was then fully withdrawn and a full coil was noted within the bladder. The bladder was then drained.  A few delayed images were obtained to ensure that the left kidney drained the contrast material which was appreciated within a few minutes. The patient was then cleaned and dried, repositioned supine position, reversed from anesthesia, taken to  the PACU in stable condition.  Upon arriving to the PACU, formal CBC BMP and type and cross were obtained. The patient's hemoglobin has precipitously dropped to 5.1 from  slightly over 76 days ago.  She is also hyperkalemic with a potassium of 5.7 I suspect her renal function has continued to decline since her discharge. I called my medicine colleagues to further assess the patient and admit her.  From a urological perspective, there is no evidence of stent encrustation no clear hydronephrosis on retrograde pyelogram today.  As such, I do not feel that there is likely an obstructive component to her acute on chronic renal failure.  I'll continue to follow along with this patient while she is admitted. Findings and plan were discussed with the patient's husband and daughter.  Hollice Espy, M.D.

## 2016-05-12 NOTE — H&P (View-Only) (Signed)
Urology Consult Follow Up  Subjective: Sleeping this a.m. Received 1 unit of packed red blood cells overnight. Creatinine is stabilized. No further hematuria.  Anti-infectives: Anti-infectives    None      Current Facility-Administered Medications  Medication Dose Route Frequency Provider Last Rate Last Dose  . acetaminophen (TYLENOL) tablet 650 mg  650 mg Oral Q6H PRN Nicholes Mango, MD      . albuterol (PROVENTIL) (2.5 MG/3ML) 0.083% nebulizer solution 2.5 mg  2.5 mg Nebulization QID PRN Epifanio Lesches, MD      . atorvastatin (LIPITOR) tablet 40 mg  40 mg Oral q1800 Epifanio Lesches, MD   40 mg at 05/07/16 1818  . bisacodyl (DULCOLAX) EC tablet 5 mg  5 mg Oral Daily PRN Epifanio Lesches, MD      . cholecalciferol (VITAMIN D) tablet 1,000 Units  1,000 Units Oral q morning - 10a Epifanio Lesches, MD   1,000 Units at 05/08/16 1007  . docusate sodium (COLACE) capsule 100 mg  100 mg Oral BID Epifanio Lesches, MD   100 mg at 05/08/16 1007  . gabapentin (NEURONTIN) capsule 100 mg  100 mg Oral BID Nicholes Mango, MD   100 mg at 05/08/16 1007  . mirabegron ER (MYRBETRIQ) tablet 25 mg  25 mg Oral Daily Epifanio Lesches, MD   25 mg at 05/08/16 1007  . ondansetron (ZOFRAN) tablet 4 mg  4 mg Oral Q6H PRN Epifanio Lesches, MD       Or  . ondansetron (ZOFRAN) injection 4 mg  4 mg Intravenous Q6H PRN Epifanio Lesches, MD      . opium-belladonna (B&O SUPPRETTES) 16.2-60 MG suppository 1 suppository  1 suppository Rectal Q6H PRN Nicholes Mango, MD      . pantoprazole (PROTONIX) EC tablet 40 mg  40 mg Oral Daily Epifanio Lesches, MD   40 mg at 05/08/16 1007  . traMADol (ULTRAM) tablet 50 mg  50 mg Oral Q12H Nicholes Mango, MD   50 mg at 05/08/16 1007     Objective: Vital signs in last 24 hours: Temp:  [97.6 F (36.4 C)-99.2 F (37.3 C)] 99.2 F (37.3 C) (10/20 1405) Pulse Rate:  [74-85] 78 (10/20 1405) Resp:  [17-20] 18 (10/20 1405) BP: (120-142)/(48-66) 120/48 (10/20  1405) SpO2:  [95 %-98 %] 98 % (10/20 1405) Weight:  [177 lb 6.4 oz (80.5 kg)] 177 lb 6.4 oz (80.5 kg) (10/20 0556)  Intake/Output from previous day: 10/19 0701 - 10/20 0700 In: 1034 [P.O.:530; Blood:350] Out: 1325 [Urine:1325] Intake/Output this shift: Total I/O In: -  Out: 500 [Urine:500]   Physical Exam Constitutional: Well nourished. Alert and oriented, No acute distress. HEENT: [place] AT, moist mucus membranes. Trachea midline, no masses. Cardiovascular: No clubbing, cyanosis, or edema. Respiratory: Normal respiratory effort, no increased work of breathing. GI: Abdomen is soft, non tender, non distended. GU: No CVA tenderness.  No bladder fullness or masses.   Foley draining clear urine with sediment.   Neurologic: Grossly intact, no focal deficits, moving all 4 extremities. Psychiatric: Normal mood and affect.  Lab Results:   Recent Labs  05/07/16 0500 05/08/16 0550  WBC 5.9 8.0  HGB 6.5* 7.5*  HCT 20.3* 22.9*  PLT 139* 172   BMET  Recent Labs  05/07/16 0500 05/08/16 0550  NA 139 138  K 4.5 4.9  CL 118* 118*  CO2 15* 15*  GLUCOSE 84 89  BUN 60* 57*  CREATININE 4.52* 4.53*  CALCIUM 8.2* 8.4*    Studies/Results: US Renal  Result Date:  05/07/2016 CLINICAL DATA:  Recent left-sided hydronephrosis. History of urinary bladder carcinoma. Left double-J stent in place EXAM: RENAL / URINARY TRACT ULTRASOUND COMPLETE COMPARISON:  May 04, 2016 FINDINGS: Right Kidney: Length: 10.4 cm. Echogenicity is increased. Renal cortical thickness within normal limits. No perinephric fluid or hydronephrosis visualized. There is a 7 x 8 x 7 mm cyst in the upper pole kidney medially. No sonographically demonstrable calculus or ureterectasis. Left Kidney: Length: 10.2 cm. Echogenicity is increased. Renal cortical thickness is within normal limits. Hydronephrosis appears essentially stable compared to recent prior study. Double-J stent is noted to be present on the left. The  previously noted complex cystic structure in the mid left kidney persists, measuring 2.5 x 1.5 x 2.2 cm. No new mass. No perinephric fluid. No sonographically demonstrable calculus or ureterectasis evident. Bladder: Urinary bladder wall remains markedly thickened and somewhat irregular. IMPRESSION: Essentially no change from recent prior examination. Persistent fairly mild hydronephrosis on the left with double-J stent in place. Complex mass left kidney midportion, likely a complex cyst, stable. No new renal lesions evident. Irregular thickening of the urinary bladder wall remains. This finding is most likely due to the prior carcinoma of the urinary bladder. A degree of superimposed cystitis cannot be excluded by sonography. Electronically Signed   By: Lowella Grip III M.D.   On: 05/07/2016 09:40   Assessment: 40 old female with multiple medical co morbidities and muscle invasive bladder cancer being managed currently undergoing chemotherapy/radiation as not a cystectomy candidate admitted with chest pain, deconditioning, anemia and renal failure.  She does have a history of left distal ureteral obstruction from her tumor currently with a [employer] Optima indwelling stent.  Slight interval increase in hydronephrosis on admission with a full bladder.  Renal ultrasound has remained stable despite Foley catheterization.  Creatinine has stabilized at 4.5 without further improvement or worsening. Good urine output. Clinically improved.  Plan: -Will arrange for outpatient ureteral stent exchange with possible repeat TURBT on Tuesday, okay to discharge from urologic perspective with Foley catheter in place -If the stent exchange fails to normalize her creatinine, we'll consider percutaneous nephrostomy tube -Plan was discussed with Dr. Margaretmary Eddy -B&O suppositories and [place] as needed for bladder spasms   LOS: 4 days    Hollice Espy 05/08/2016

## 2016-05-12 NOTE — Transfer of Care (Signed)
Immediate Anesthesia Transfer of Care Note  Patient: Barbara [name]  Procedure(s) Performed: Procedure(s): CYSTOSCOPY WITH STENT REPLACEMENT (Left)  Patient Location: PACU  Anesthesia Type:General  Level of Consciousness: awake  Airway & Oxygen Therapy: Patient Spontanous Breathing and Patient connected to face mask oxygen  Post-op Assessment: Report given to RN  Post vital signs: Reviewed and stable  Last Vitals:  Vitals:   05/12/16 1105 05/12/16 1343  BP: (!) 122/50 (!) 110/54  Pulse: 87 79  Resp: 18 14  Temp: (!) 35.8 C 36.6 C    Last Pain:  Vitals:   05/12/16 1343  TempSrc:   PainSc: Asleep      Patients Stated Pain Goal: 0 (20/03/79 4446)  Complications: No apparent anesthesia complications

## 2016-05-13 ENCOUNTER — Ambulatory Visit
Admit: 2016-05-13 | Discharge: 2016-05-13 | Disposition: A | Discharge status: Home / Self Care | Payer: Medicare Other | Attending: Radiation Oncology | Admitting: Radiation Oncology

## 2016-05-13 ENCOUNTER — Ambulatory Visit: Payer: Medicare Other

## 2016-05-13 ENCOUNTER — Encounter: Payer: Self-pay | Admitting: Urology

## 2016-05-13 DIAGNOSIS — C679 Malignant neoplasm of bladder, unspecified: Secondary | ICD-10-CM

## 2016-05-13 DIAGNOSIS — N1330 Unspecified hydronephrosis: Secondary | ICD-10-CM

## 2016-05-13 LAB — HEMOGLOBIN A1C
HEMOGLOBIN A1C: 6.4 % — AB (ref 4.8–5.6)
Mean Plasma Glucose: 137 mg/dL

## 2016-05-13 LAB — CBC
HCT: 23.3 % — ABNORMAL LOW (ref 35.0–47.0)
HEMOGLOBIN: 7.5 g/dL — AB (ref 12.0–16.0)
MCH: 27.8 pg (ref 26.0–34.0)
MCHC: 32.3 g/dL (ref 32.0–36.0)
MCV: 85.8 fL (ref 80.0–100.0)
Platelets: 402 10*3/uL (ref 150–440)
RBC: 2.71 MIL/uL — AB (ref 3.80–5.20)
RDW: 17.7 % — ABNORMAL HIGH (ref 11.5–14.5)
WBC: 16.4 10*3/uL — AB (ref 3.6–11.0)

## 2016-05-13 LAB — BASIC METABOLIC PANEL
ANION GAP: 9 (ref 5–15)
BUN: 82 mg/dL — ABNORMAL HIGH (ref 6–20)
CALCIUM: 8.1 mg/dL — AB (ref 8.9–10.3)
CHLORIDE: 114 mmol/L — AB (ref 101–111)
CO2: 15 mmol/L — AB (ref 22–32)
Creatinine, Ser: 4.98 mg/dL — ABNORMAL HIGH (ref 0.44–1.00)
GFR calc non Af Amer: 8 mL/min — ABNORMAL LOW (ref >60)
GFR, EST AFRICAN AMERICAN: 9 mL/min — AB (ref >60)
Glucose, Bld: 92 mg/dL (ref 65–99)
Potassium: 5.4 mmol/L — ABNORMAL HIGH (ref 3.5–5.1)
Sodium: 138 mmol/L (ref 135–145)

## 2016-05-13 LAB — GLUCOSE, CAPILLARY
GLUCOSE-CAPILLARY: 82 mg/dL (ref 65–99)
GLUCOSE-CAPILLARY: 102 mg/dL — AB (ref 65–99)
GLUCOSE-CAPILLARY: 103 mg/dL — AB (ref 65–99)
Glucose-Capillary: 109 mg/dL — ABNORMAL HIGH (ref 65–99)

## 2016-05-13 LAB — POTASSIUM: POTASSIUM: 5.1 mmol/L (ref 3.5–5.1)

## 2016-05-13 MED ORDER — SODIUM BICARBONATE 8.4 % IV SOLN
INTRAVENOUS | Status: DC
  Administered 2016-05-13 – 2016-05-14 (×3): via INTRAVENOUS
  Filled 2016-05-13 (×5): qty 150

## 2016-05-13 NOTE — Progress Notes (Signed)
Urology Consult Follow Up  Subjective: Patient is POD # 1 for a left ureteral stent exchange.  Patient has no complaints this am.  She did state that she has to urinate a lot during the night due to stent irritation, which is normal.  She denies any flank pain.  VSS afebrile.    Anti-infectives: Anti-infectives    Start     Dose/Rate Route Frequency Ordered Stop   05/12/16 0221  ceFAZolin (ANCEF) IVPB 2g/100 mL premix     2 g 200 mL/hr over 30 Minutes Intravenous 30 min pre-op 05/12/16 0221 05/12/16 1318      Current Facility-Administered Medications  Medication Dose Route Frequency Provider Last Rate Last Dose  . 0.9 %  sodium chloride infusion   Intravenous Continuous Lytle Butte, MD 75 mL/hr at 05/13/16 0124    . acetaminophen (TYLENOL) tablet 650 mg  650 mg Oral Q6H PRN Lytle Butte, MD   650 mg at 05/12/16 1606   Or  . acetaminophen (TYLENOL) suppository 650 mg  650 mg Rectal Q6H PRN Lytle Butte, MD      . albuterol (PROVENTIL) (2.5 MG/3ML) 0.083% nebulizer solution 3 mL  3 mL Inhalation QID PRN Lytle Butte, MD      . atorvastatin (LIPITOR) tablet 40 mg  40 mg Oral q1800 Lytle Butte, MD   40 mg at 05/12/16 1856  . docusate sodium (COLACE) capsule 100 mg  100 mg Oral BID PRN Lytle Butte, MD      . gabapentin (NEURONTIN) capsule 600 mg  600 mg Oral QHS Lytle Butte, MD   600 mg at 05/12/16 2140  . insulin aspart (novoLOG) injection 0-5 Units  0-5 Units Subcutaneous QHS Lytle Butte, MD      . insulin aspart (novoLOG) injection 0-9 Units  0-9 Units Subcutaneous TID WC Lytle Butte, MD      . insulin glargine (LANTUS) injection 15 Units  15 Units Subcutaneous QHS Lytle Butte, MD   15 Units at 05/12/16 2140  . ondansetron (ZOFRAN) tablet 4 mg  4 mg Oral Q6H PRN Lytle Butte, MD       Or  . ondansetron [facility]) injection 4 mg  4 mg Intravenous Q6H PRN Lytle Butte, MD      . oxybutynin (DITROPAN) tablet 5 mg  5 mg Oral Q8H PRN Lytle Butte, MD      . pantoprazole  (PROTONIX) EC tablet 40 mg  40 mg Oral Daily Lytle Butte, MD   40 mg at 05/12/16 1856  . patiromer Daryll Drown) packet 8.4 g  8.4 g Oral Daily Lytle Butte, MD   8.4 g at 05/12/16 1821  . sodium chloride flush (NS) 0.9 % injection 3 mL  3 mL Intravenous Q12H Lytle Butte, MD   3 mL at 05/12/16 2142  . traMADol (ULTRAM) tablet 50 mg  50 mg Oral Q12H Lytle Butte, MD   50 mg at 05/12/16 1607  . Vitamin D-3 CAPS 1,000 Units  1 capsule Oral q morning - 10a Lytle Butte, MD         Objective: Vital signs in last 24 hours: Temp:  [96.4 F (35.8 C)-98.1 F (36.7 C)] 98.1 F (36.7 C) (10/25 0418) Pulse Rate:  [70-99] 87 (10/25 0418) Resp:  [12-18] 18 (10/25 0418) BP: (96-139)/(41-59) 111/59 (10/25 0418) SpO2:  [98 %-100 %] 98 % (10/25 0418) Weight:  [170 lb (77.1 kg)] 170 lb (77.1  kg) (10/24 1105)  Intake/Output from previous day: 10/24 0701 - 10/25 0700 In: 1011.8 [I.V.:409.7; Blood:602.2] Out: 277 [Urine:275; Blood:2] Intake/Output this shift: Total I/O In: -  Out: 200 [Urine:200]   Physical Exam Constitutional: Well nourished. Alert and oriented, No acute distress. HEENT: [place] AT, moist mucus membranes. Trachea midline, no masses. Cardiovascular: No clubbing, cyanosis, or edema. Respiratory: Normal respiratory effort, no increased work of breathing. GI: Abdomen is soft, non tender, non distended, no abdominal masses. Liver and spleen not palpable.  No hernias appreciated.  Stool sample for occult testing is not indicated.   GU: No CVA tenderness.  No bladder fullness or masses.   Skin: No rashes, bruises or suspicious lesions. Lymph: No cervical or inguinal adenopathy. Neurologic: Grossly intact, no focal deficits, moving all 4 extremities. Psychiatric: Normal mood and affect.  Lab Results:   Recent Labs  05/12/16 1414 05/13/16 0433  WBC 14.1* 16.4*  HGB 5.3* 7.5*  HCT 16.8* 23.3*  PLT 432 402   BMET  Recent Labs  05/12/16 1414 05/13/16 0433  NA 139 138  K  6.0* 5.4*  CL 114* 114*  CO2 16* 15*  GLUCOSE 109* 92  BUN 83* 82*  CREATININE 4.83* 4.98*  CALCIUM 8.3* 8.1*   PT/INR No results for input(s): LABPROT, INR in the last 72 hours. ABG No results for input(s): PHART, HCO3 in the last 72 hours.  Invalid input(s): PCO2, PO2  Studies/Results: No results found.   Assessment and Plan Patient with a history of muscle invasive bladder cancer s/p chemotherapy and radiation with stage IV CKD admitted after a left ureteral stent exchange for hyperkalemia (5.7)  and a significant drop in hemoglobin form 7.3 to 5.1.  Patient has received transfusion and hemoglobin is now 7.5 this am.  Her potassium is down to 5.4 this am.  1. Hyperkalemia  - potassium down to 5.4   - [place] started  - nephrology on board  2. Anemia, normocytic  - transfusion complete, hemoglobin 7.3 this am  3. Stage IV CKD  - nephrology on board  4. Type 2 DM  - medicine on board  Will continue to follow while admitted    LOS: 1 day    [place] [facility] 05/13/2016

## 2016-05-13 NOTE — Progress Notes (Signed)
[facility]  ROUNDING NOTE   Subjective:   Patient unable to give much of a history. Husband at bedside.  UOP 550  Creatinine 4.98 (4.83) K 5.4 CO2 15   Objective:  Vital signs in last 24 hours:  Temp:  [97.2 F (36.2 C)-98.1 F (36.7 C)] 98.1 F (36.7 C) (10/25 1302) Pulse Rate:  [70-99] 87 (10/25 1302) Resp:  [12-20] 20 (10/25 1302) BP: (96-139)/(41-59) 113/50 (10/25 1302) SpO2:  [98 %-100 %] 100 % (10/25 1302)  Weight change:  Filed Weights   05/12/16 1105  Weight: 77.1 kg (170 lb)    Intake/Output: I/O last 3 completed shifts: In: 1011.8 [I.V.:409.7; Blood:602.2] Out: 277 [Urine:275; Blood:2]   Intake/Output this shift:  Total I/O In: 352.5 [P.O.:120; I.V.:232.5] Out: 850 [Urine:850]  Physical Exam: General: No acute distress  Head: Normocephalic, atraumatic. Moist oral mucosal membranes  Eyes: Anicteric  Neck: Supple, trachea midline  Lungs:  Clear to auscultation, normal effort  Heart: S1S2 no rubs  Abdomen:  Soft, nontender, BS present  Extremities: some peripheral edema.  Neurologic: Nonfocal, moving all four extremities  Skin: No lesions  GU: Foley catheter in place, no hematuria now    Basic Metabolic Panel:  Recent Labs Lab 05/07/16 0500 05/08/16 0550 05/09/16 0500 05/12/16 1233 05/12/16 1414 05/13/16 0433  NA 139 138 139 141 139 138  K 4.5 4.9 5.0 5.7* 6.0* 5.4*  CL 118* 118* 117*  --  114* 114*  CO2 15* 15* 16*  --  16* 15*  GLUCOSE 84 89 106* 102* 109* 92  BUN 60* 57* 55*  --  83* 82*  CREATININE 4.52* 4.53* 4.12*  --  4.83* 4.98*  CALCIUM 8.2* 8.4* 8.4*  --  8.3* 8.1*    Liver Function Tests: No results for input(s): AST, ALT, ALKPHOS, BILITOT, PROT, ALBUMIN in the last 168 hours. No results for input(s): LIPASE, AMYLASE in the last 168 hours. No results for input(s): AMMONIA in the last 168 hours.  CBC:  Recent Labs Lab 05/07/16 0500 05/08/16 0550 05/09/16 0500 05/12/16 1233 05/12/16 1414  05/13/16 0433  WBC 5.9 8.0 9.8  --  14.1* 16.4*  HGB 6.5* 7.5* 7.2* 5.1* 5.3* 7.5*  HCT 20.3* 22.9* 21.9* 15.0* 16.8* 23.3*  MCV 82.9 83.9 83.2  --  85.6 85.8  PLT 139* 172 219  --  432 402    Cardiac Enzymes: No results for input(s): CKTOTAL, CKMB, CKMBINDEX, TROPONINI in the last 168 hours.  BNP: Invalid input(s): POCBNP  CBG:  Recent Labs Lab 05/12/16 1344 05/12/16 1823 05/12/16 2045 05/13/16 0747 05/13/16 1204  GLUCAP 108* 117* 117* 63 102*    Microbiology: Results for orders placed or performed in visit on 04/23/16  Urine culture     Status: None   Collection Time: 04/23/16 11:38 AM  Result Value Ref Range Status   Specimen Description URINE, CLEAN CATCH  Final   Special Requests NONE  Final   Culture NO GROWTH Performed at [facility]   Final   Report Status 04/24/2016 FINAL  Final    Coagulation Studies: No results for input(s): LABPROT, INR in the last 72 hours.  Urinalysis: No results for input(s): COLORURINE, LABSPEC, PHURINE, GLUCOSEU, HGBUR, BILIRUBINUR, KETONESUR, PROTEINUR, UROBILINOGEN, NITRITE, LEUKOCYTESUR in the last 72 hours.  Invalid input(s): APPERANCEUR    Imaging: No results found.   Medications:   .  sodium bicarbonate  infusion 1000 mL 75 mL/hr at 05/13/16 1257   . atorvastatin  40 mg Oral q1800  .  gabapentin  600 mg Oral QHS  . insulin aspart  0-5 Units Subcutaneous QHS  . insulin aspart  0-9 Units Subcutaneous TID WC  . insulin glargine  15 Units Subcutaneous QHS  . pantoprazole  40 mg Oral Daily  . patiromer  8.4 g Oral Daily  . sodium chloride flush  3 mL Intravenous Q12H  . traMADol  50 mg Oral Q12H  . Vitamin D-3  1 capsule Oral q morning - 10a   acetaminophen **OR** acetaminophen, albuterol, docusate sodium, ondansetron **OR** ondansetron (ZOFRAN) IV, oxybutynin  Assessment/ Plan:  75 y.o.black female with long-standing hypertension, diabetes type 2, hyperlipidemia, osteoarthritis, obstructive sleep apnea,  bladder cancer 8/17 s/p TURBT and radiation therapy/chemo with gemcitabine.  1. Acute renal failure on chronic kidney disease stage IV with hyperkalemia and metabolic acidosis: status post cystoscopy.  - Change fluids to IV bicarb gtt - Low threshold for hemodialysis. Unclear if patient would be a good candidate for long term dialysis.    2. Anemia of chronic kidney disease. Status post PRBC transfusion 2 units on 10/24 - Avoiding epogen due to malignancy   LOS: [place], [place] 10/25/20171:31 PM

## 2016-05-13 NOTE — Progress Notes (Signed)
[place] [facility] [place] NAME: Barbara [name]    MR#:  [id number]  DATE OF BIRTH:  07/30/1940  SUBJECTIVE:   Patient here due to symptomatic anemia and also hyperkalemia. Hemoglobin improved posttransfusion, potassium level still mildly elevated but stable. His husband is at bedside no acute complaints presently.  REVIEW OF SYSTEMS:    Review of Systems  Constitutional: Negative for chills and fever.  HENT: Negative for congestion and tinnitus.   Eyes: Negative for blurred vision and double vision.  Respiratory: Negative for cough, shortness of breath and wheezing.   Cardiovascular: Negative for chest pain, orthopnea and PND.  Gastrointestinal: Negative for abdominal pain, diarrhea, nausea and vomiting.  Genitourinary: Negative for dysuria and hematuria.  Neurological: Negative for dizziness, sensory change and focal weakness.  All other systems reviewed and are negative.   Nutrition: Renal Tolerating Diet: yes Tolerating PT: Await Eval.    DRUG ALLERGIES:   Allergies  Allergen Reactions  . Percocet [Oxycodone-Acetaminophen] Hives    VITALS:  Blood pressure (!) 113/50, pulse 87, temperature 98.1 F (36.7 C), temperature source Oral, resp. rate 20, height 5' 8"  (1.727 m), weight 77.1 kg (170 lb), SpO2 100 %.  PHYSICAL EXAMINATION:   Physical Exam  GENERAL:  75 y.o.-year-old patient lying in the bed in no acute distress.  EYES: Pupils equal, round, reactive to light and accommodation. No scleral icterus. Extraocular muscles intact.  HEENT: Head atraumatic, normocephalic. Oropharynx and nasopharynx clear.  NECK:  Supple, no jugular venous distention. No thyroid enlargement, no tenderness.  LUNGS: Normal breath sounds bilaterally, no wheezing, rales, rhonchi. No use of accessory muscles of respiration.  CARDIOVASCULAR: S1, S2 normal. No murmurs, rubs, or gallops.  ABDOMEN: Soft, nontender, nondistended. Bowel sounds present. No  organomegaly or mass.  EXTREMITIES: No cyanosis, clubbing or edema b/l.    NEUROLOGIC: Cranial nerves II through XII are intact. No focal Motor or sensory deficits b/l. Globally weak.    PSYCHIATRIC: The patient is alert and oriented x 3.  SKIN: No obvious rash, lesion, or ulcer.    LABORATORY PANEL:   CBC  Recent Labs Lab 05/13/16 0433  WBC 16.4*  HGB 7.5*  HCT 23.3*  PLT 402   ------------------------------------------------------------------------------------------------------------------  Chemistries   Recent Labs Lab 05/13/16 0433  NA 138  K 5.4*  CL 114*  CO2 15*  GLUCOSE 92  BUN 82*  CREATININE 4.98*  CALCIUM 8.1*   ------------------------------------------------------------------------------------------------------------------  Cardiac Enzymes No results for input(s): TROPONINI in the last 168 hours. ------------------------------------------------------------------------------------------------------------------  RADIOLOGY:  No results found.   ASSESSMENT AND PLAN:   75 year old female with past medical history of bladder cancer, chronic kidney disease stage III, hypertension, restrictive sleep apnea, COPD, anemia of chronic disease who presented to the hospital after a cystoscopy and noted to be anemic and hyperkalemic  1. Hyperkalemia-in the setting of chronic kidney disease stage IV. Patient has been started [place] and K has improved and will monitor.   2. Symptomatic Anemia - pt. 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  - transfused and Hg. Has improved and will cont. To monitor.   3. Acute on chronic renal failure-baseline creatinine between 2-3. Currently close to 5. -Nephrology is following the patient. Continue further care as per them. No plans for hemodialysis presently.  4. DM type II w/out complication - cont. Lantus, SSI for now.   5. Urinary Incontinence - cont. Ditropan.   6. GERD - cont. Protonix.   7. Hx of  Bladder Cancer  - currently getting chemo/radi.  - cystoscopy showing no bladder mass yesterday. Cont. Further care as per Urology/Hem/onc.   8. DM neuropathy - cont. Neurontin.   9. Hyperlipidemia - cont. Atorvastatin   All the records are reviewed and case discussed with Care Management/Social Worker. Management plans discussed with the patient, family and they are in agreement.  CODE STATUS: Full code  DVT Prophylaxis: Ted's & SCD's  TOTAL TIME TAKING CARE OF THIS PATIENT: 30 minutes.   POSSIBLE D/C IN 2-3 DAYS, DEPENDING ON CLINICAL CONDITION.   Henreitta Leber M.D on 05/13/2016 at 2:37 PM  Between 7am to 6pm - Pager - [phone]  After 6pm go to [email or web address] - Proofreader  [facility]  Office  [phone]  CC: Primary care physician; Madelyn Brunner, MD

## 2016-05-13 NOTE — Anesthesia Postprocedure Evaluation (Signed)
Anesthesia Post Note  Patient: ARYIANNA EARWOOD  Procedure(s) Performed: Procedure(s) (LRB): CYSTOSCOPY WITH STENT REPLACEMENT (Left)  Patient location during evaluation: PACU Anesthesia Type: General Level of consciousness: awake and alert Pain management: pain level controlled Vital Signs Assessment: post-procedure vital signs reviewed and stable Respiratory status: spontaneous breathing, nonlabored ventilation, respiratory function stable and patient connected to nasal cannula oxygen Cardiovascular status: blood pressure returned to baseline and stable Postop Assessment: no signs of nausea or vomiting Anesthetic complications: no    Last Vitals:  Vitals:   05/13/16 0122 05/13/16 0418  BP: (!) 118/56 (!) 111/59  Pulse: 80 87  Resp: 12 18  Temp: 36.7 C 36.7 C    Last Pain:  Vitals:   05/13/16 0418  TempSrc: Oral  PainSc:                  Martha Clan

## 2016-05-13 NOTE — Progress Notes (Signed)
PT Cancellation Note  Patient Details Name: Barbara [name] MRN: [id number] DOB: [birth year]   Cancelled Treatment:    Reason Eval/Treat Not Completed: Patient at procedure or test/unavailable (Consult received and chart reviewed.  Patient currently off unit for radiation treatment.  Will re-attempt next date as patient available and medically appropriate.)   [name] H. Owens Shark, PT, DPT, NCS 05/13/16, 3:38 PM [phone]

## 2016-05-14 ENCOUNTER — Ambulatory Visit: Payer: Medicare Other

## 2016-05-14 ENCOUNTER — Ambulatory Visit
Admit: 2016-05-14 | Discharge: 2016-05-14 | Disposition: A | Discharge status: Home / Self Care | Payer: Medicare Other | Attending: Radiation Oncology | Admitting: Radiation Oncology

## 2016-05-14 LAB — CBC
HCT: 19.9 % — ABNORMAL LOW (ref 35.0–47.0)
Hemoglobin: 6.6 g/dL — ABNORMAL LOW (ref 12.0–16.0)
MCH: 28 pg (ref 26.0–34.0)
MCHC: 33.2 g/dL (ref 32.0–36.0)
MCV: 84.3 fL (ref 80.0–100.0)
PLATELETS: 380 10*3/uL (ref 150–440)
RBC: 2.36 MIL/uL — AB (ref 3.80–5.20)
RDW: 18.7 % — ABNORMAL HIGH (ref 11.5–14.5)
WBC: 14.9 10*3/uL — AB (ref 3.6–11.0)

## 2016-05-14 LAB — BASIC METABOLIC PANEL
Anion gap: 8 (ref 5–15)
BUN: 76 mg/dL — AB (ref 6–20)
CO2: 19 mmol/L — ABNORMAL LOW (ref 22–32)
Calcium: 7.9 mg/dL — ABNORMAL LOW (ref 8.9–10.3)
Chloride: 110 mmol/L (ref 101–111)
Creatinine, Ser: 4.72 mg/dL — ABNORMAL HIGH (ref 0.44–1.00)
GFR, EST AFRICAN AMERICAN: 9 mL/min — AB (ref >60)
GFR, EST NON AFRICAN AMERICAN: 8 mL/min — AB (ref >60)
Glucose, Bld: 110 mg/dL — ABNORMAL HIGH (ref 65–99)
POTASSIUM: 4.4 mmol/L (ref 3.5–5.1)
SODIUM: 137 mmol/L (ref 135–145)

## 2016-05-14 LAB — GLUCOSE, CAPILLARY
GLUCOSE-CAPILLARY: 92 mg/dL (ref 65–99)
Glucose-Capillary: 107 mg/dL — ABNORMAL HIGH (ref 65–99)
Glucose-Capillary: 101 mg/dL — ABNORMAL HIGH (ref 65–99)
Glucose-Capillary: 139 mg/dL — ABNORMAL HIGH (ref 65–99)

## 2016-05-14 LAB — PREPARE RBC (CROSSMATCH)

## 2016-05-14 MED ORDER — BISACODYL 10 MG RE SUPP
10.0000 mg | Freq: Once | RECTAL | Status: AC
  Administered 2016-05-14: 10 mg via RECTAL
  Filled 2016-05-14: qty 1

## 2016-05-14 MED ORDER — PHENAZOPYRIDINE HCL 100 MG PO TABS: 100.0000 mg | ORAL_TABLET | Freq: Three times a day (TID) | ORAL | Status: DC | PRN

## 2016-05-14 MED ORDER — SODIUM CHLORIDE 0.9 % IV SOLN
Freq: Once | INTRAVENOUS | Status: AC
  Administered 2016-05-14: 12:00:00 via INTRAVENOUS

## 2016-05-14 NOTE — Progress Notes (Signed)
No BM since 10/23.  Dr Verdell Carmine gave an order for a once suppository of dulcolax

## 2016-05-14 NOTE — Progress Notes (Signed)
Patient left for her radiation treatment at the cancer center

## 2016-05-14 NOTE — Progress Notes (Signed)
Tera Helper from pharmacy informed me that pyridium is contraindicated for the patient due to her Creatinine.  Order removed.  Pt has oxyburynin

## 2016-05-14 NOTE — Progress Notes (Signed)
[facility] [place] [facility] [place] NAME: Barbara [name]    MR#:  [id number]  DATE OF BIRTH:  Apr 26, 1941  SUBJECTIVE:   Patient here due to symptomatic anemia and also hyperkalemia. Hg. Down to 6.6 today, potassium level improved.  No acute events overnight.    REVIEW OF SYSTEMS:    Review of Systems  Constitutional: Negative for chills and fever.  HENT: Negative for congestion and tinnitus.   Eyes: Negative for blurred vision and double vision.  Respiratory: Negative for cough, shortness of breath and wheezing.   Cardiovascular: Negative for chest pain, orthopnea and PND.  Gastrointestinal: Negative for abdominal pain, diarrhea, nausea and vomiting.  Genitourinary: Negative for dysuria and hematuria.  Neurological: Negative for dizziness, sensory change and focal weakness.  All other systems reviewed and are negative.   Nutrition: Renal Tolerating Diet: yes Tolerating PT: Await Eval.    DRUG ALLERGIES:   Allergies  Allergen Reactions  . Percocet [Oxycodone-Acetaminophen] Hives    VITALS:  Blood pressure (!) 102/58, pulse 88, temperature 98.6 F (37 C), temperature source Oral, resp. rate 18, height 5' 8"  (1.727 m), weight 77.1 kg (170 lb), SpO2 100 %.  PHYSICAL EXAMINATION:   Physical Exam  GENERAL:  75 y.o.-year-old patient lying in the bed in no acute distress.  EYES: Pupils equal, round, reactive to light and accommodation. No scleral icterus. Extraocular muscles intact.  HEENT: Head atraumatic, normocephalic. Oropharynx and nasopharynx clear.  NECK:  Supple, no jugular venous distention. No thyroid enlargement, no tenderness.  LUNGS: Normal breath sounds bilaterally, no wheezing, rales, rhonchi. No use of accessory muscles of respiration.  CARDIOVASCULAR: S1, S2 normal. No murmurs, rubs, or gallops.  ABDOMEN: Soft, nontender, nondistended. Bowel sounds present. No organomegaly or mass.  EXTREMITIES: No cyanosis, clubbing or edema b/l.     NEUROLOGIC: Cranial nerves II through XII are intact. No focal Motor or sensory deficits b/l. Globally weak.    PSYCHIATRIC: The patient is alert and oriented x 3.  SKIN: No obvious rash, lesion, or ulcer.    LABORATORY PANEL:   CBC  Recent Labs Lab 05/14/16 0512  WBC 14.9*  HGB 6.6*  HCT 19.9*  PLT 380   ------------------------------------------------------------------------------------------------------------------  Chemistries   Recent Labs Lab 05/14/16 0512  NA 137  K 4.4  CL 110  CO2 19*  GLUCOSE 110*  BUN 76*  CREATININE 4.72*  CALCIUM 7.9*   ------------------------------------------------------------------------------------------------------------------  Cardiac Enzymes No results for input(s): TROPONINI in the last 168 hours. ------------------------------------------------------------------------------------------------------------------  RADIOLOGY:  No results found.   ASSESSMENT AND PLAN:   75 year old female with past medical history of bladder cancer, chronic kidney disease stage III, hypertension, restrictive sleep apnea, COPD, anemia of chronic disease who presented to the hospital after a cystoscopy and noted to be anemic and hyperkalemic  1. Hyperkalemia-in the setting of chronic kidney disease stage IV.  - pt. Is on veltassa and bicarb drip and K has improved.    2. Symptomatic Anemia - pt. 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  - hg. 6.6 today and will give 1 unit and follow Hg.    3. Acute on chronic renal failure-baseline creatinine between 2-3. Today 4.7 -Nephrology is following the patient.  - cont. Sodium bicarb.  NO acute indication for HD.   4. DM type II w/out complication - cont. Lantus, SSI for now.   5. Urinary Incontinence - cont. Ditropan.   6. GERD - cont. Protonix.   7. Hx of Bladder  Cancer - currently getting chemo/radi.  - cystoscopy showing no bladder mass yesterday. Cont. Further care as per  Urology/Hem/onc.   8. DM neuropathy - cont. Neurontin.   9. Hyperlipidemia - cont. Atorvastatin   All the records are reviewed and case discussed with Care Management/Social Worker. Management plans discussed with the patient, family and they are in agreement.  CODE STATUS: Full code  DVT Prophylaxis: Ted's & SCD's  TOTAL TIME TAKING CARE OF THIS PATIENT: 30 minutes.   POSSIBLE D/C IN 1-2 DAYS, DEPENDING ON CLINICAL CONDITION.   Henreitta Leber M.D on 05/14/2016 at 12:27 PM  Between 7am to 6pm - Pager - [phone]  After 6pm go to [email or web address] - Proofreader  [facility]  Office  [phone]  CC: Primary care physician; Madelyn Brunner, MD

## 2016-05-14 NOTE — Progress Notes (Signed)
Pt is having pain with urination.  Dr Erlene Quan notified.  Pyridium ordered 184m PRN

## 2016-05-14 NOTE — Evaluation (Addendum)
Physical Therapy Evaluation Patient Details Name: Barbara [name] MRN: [id number] DOB: April 04, 1941 Today's Date: 05/14/2016   History of Present Illness  presented to ER secondary to cystoscopy with uretel stent exchange, retrograde pyelogram; admitted with post-op hyperkalemia and symptomatic anemia.  Current HgB 6.6, scheduled for transfusion this date; okay for light participation with therapy per RN.  Clinical Impression  Upon evaluation, patient alert and oriented; follows simple commands with moderate encouragement from both therapist and nurse.  Bilat UE/LE generally deconditioned due to both acute/chronic illnesses, but functional for basic transfers and mobility (4/5).  Able to complete bed mobility with mod indep; sit/stand, basic transfers and gait (5') without assist device (patient refused), cga/min assist.  No overt buckling or LOB, but does tendsto reach for armrests, external surfaces for stabilization as needed; will plan to trial SPC vs. RW in subsequent sessions as patient allows. Would benefit from skilled PT to address above deficits and promote optimal return to PLOF; Recommend transition to [place] upon discharge from acute hospitalization.     Follow Up Recommendations  HHPT    Equipment Recommendations  Rolling walker with 5" wheels    Recommendations for Other Services       Precautions / Restrictions Precautions Precautions: Fall Precaution Comments: 1200 mL fluid restriction Restrictions Weight Bearing Restrictions: No      Mobility  Bed Mobility Overal bed mobility: Needs Assistance Bed Mobility: Supine to Sit     Supine to sit: Supervision        Transfers Overall transfer level: Needs assistance Equipment used: Rolling walker (2 wheeled) Transfers: Sit to/from Stand Sit to Stand: Min guard;Min assist         General transfer comment: mild support from UEs for lift off and external stabilization  Ambulation/Gait Ambulation/Gait assistance:  Min assist Ambulation Distance (Feet): 5 Feet Assistive device: None       General Gait Details: refused use of assist device at this time; broad BOS; slighty choppy steps.  no overt buckling or LOB.  Refused further distances.  Stairs            Wheelchair Mobility    Modified Rankin (Stroke Patients Only)       Balance Overall balance assessment: Needs assistance Sitting-balance support: No upper extremity supported;Feet supported Sitting balance-Leahy Scale: Good     Standing balance support: No upper extremity supported Standing balance-Leahy Scale: Fair                               Pertinent Vitals/Pain Pain Assessment: No/denies pain    Home Living Family/patient expects to be discharged to:: Private residence Living Arrangements: Spouse/significant other Available Help at Discharge: Family;Available 24 hours/day Type of Home: House Home Access: Stairs to enter Entrance Stairs-Rails: None Entrance Stairs-Number of Steps: 1   Home Equipment: Walker - 2 wheels;Cane - single point;Wheelchair - manual Additional Comments: Social history taken from previous documentation; patient slightly unwilling to provide details/information during session this date    Prior Function Level of Independence: Independent with assistive device(s)         Comments: Mod indep with SPC vs. RW for ADLs and household mobility     Hand Dominance        Extremity/Trunk Assessment   Upper Extremity Assessment: Generalized weakness           Lower Extremity Assessment: Generalized weakness (grossly 4/5 throughout)         Communication  Cognition Arousal/Alertness: Awake/alert Behavior During Therapy: Agitated Overall Cognitive Status: Difficult to assess (limited cooperation with cognitive assessment; generally agitated with questioning from therapist)                      General Comments      Exercises Other Exercises Other  Exercises: Toilet transfer, SPT without assist device, cga/min assist; sit/stand from [facility] without assist device, cga/min assist; standing balance without assist device, cga/min with functional reach (3-4" from immediate BOS)   Assessment/Plan    PT Assessment Patient needs continued PT services  PT Problem List Decreased strength;Decreased range of motion;Decreased activity tolerance;Decreased balance;Decreased mobility;Decreased coordination;Decreased cognition;Decreased knowledge of use of DME;Decreased safety awareness;Decreased knowledge of precautions;Cardiopulmonary status limiting activity;Decreased skin integrity;Pain          PT Treatment Interventions DME instruction;Gait training;Stair training;Functional mobility training;Therapeutic activities;Therapeutic exercise;Balance training;Neuromuscular re-education;Patient/family education    PT Goals (Current goals can be found in the Care Plan section)  Acute Rehab PT Goals Patient Stated Goal: to return home PT Goal Formulation: With patient Time For Goal Achievement: 05/28/16 Potential to Achieve Goals: Fair    Frequency Min 2X/week   Barriers to discharge Inaccessible home environment      Co-evaluation               End of Session Equipment Utilized During Treatment: Gait belt Activity Tolerance: Patient tolerated treatment well Patient left: in chair;with call bell/phone within reach;with chair alarm set;with nursing/sitter in room Nurse Communication: Mobility status         Time: 0102-7253 PT Time Calculation (min) (ACUTE ONLY): 23 min   Charges:   PT Evaluation $PT Eval Low Complexity: 1 Procedure PT Treatments $Therapeutic Activity: 8-22 mins   PT G Codes:        Barbara [name], PT, DPT, NCS 05/14/16, 5:11 PM [phone]  Note updated to reflect current DC recommendations; okay for home with HHPT.  Barbara [name], PT, DPT, NCS 05/15/16, 10:39 AM [phone]

## 2016-05-14 NOTE — Progress Notes (Signed)
[facility]  ROUNDING NOTE   Subjective:   Scheduled for radiation therapy today.  UOP 1150 (550)  Creatinine 4.72 (4.98) (4.83)  Hgb 6.6  Bicarb gtt @ 75  Objective:  Vital signs in last 24 hours:  Temp:  [98 F (36.7 C)-98.1 F (36.7 C)] 98 F (36.7 C) (10/26 0605) Pulse Rate:  [83-88] 88 (10/26 0605) Resp:  [16-20] 20 (10/26 0605) BP: (113-124)/(50-56) 124/56 (10/26 0605) SpO2:  [100 %] 100 % (10/26 0605)  Weight change:  Filed Weights   05/12/16 1105  Weight: 77.1 kg (170 lb)    Intake/Output: I/O last 3 completed shifts: In: 2010.6 [P.O.:120; I.V.:1288.4; Blood:602.2] Out: 1425 [Urine:1425]   Intake/Output this shift:  Total I/O In: 690 [I.V.:690] Out: 425 [Urine:425]  Physical Exam: General: No acute distress  Head: Normocephalic, atraumatic. Moist oral mucosal membranes  Eyes: Anicteric  Neck: Supple, trachea midline  Lungs:  Clear to auscultation, normal effort  Heart: S1S2 no rubs  Abdomen:  Soft, nontender, BS present  Extremities: Trace peripheral edema.  Neurologic: Nonfocal, moving all four extremities  Skin: No lesions  GU: Foley catheter in place, no hematuria now    Basic Metabolic Panel:  Recent Labs Lab 05/08/16 0550 05/09/16 0500 05/12/16 1233 05/12/16 1414 05/13/16 0433 05/13/16 1354 05/14/16 0512  NA 138 139 141 139 138  --  137  K 4.9 5.0 5.7* 6.0* 5.4* 5.1 4.4  CL 118* 117*  --  114* 114*  --  110  CO2 15* 16*  --  16* 15*  --  19*  GLUCOSE 89 106* 102* 109* 92  --  110*  BUN 57* 55*  --  83* 82*  --  76*  CREATININE 4.53* 4.12*  --  4.83* 4.98*  --  4.72*  CALCIUM 8.4* 8.4*  --  8.3* 8.1*  --  7.9*    Liver Function Tests: No results for input(s): AST, ALT, ALKPHOS, BILITOT, PROT, ALBUMIN in the last 168 hours. No results for input(s): LIPASE, AMYLASE in the last 168 hours. No results for input(s): AMMONIA in the last 168 hours.  CBC:  Recent Labs Lab 05/08/16 0550 05/09/16 0500 05/12/16 1233  05/12/16 1414 05/13/16 0433 05/14/16 0512  WBC 8.0 9.8  --  14.1* 16.4* 14.9*  HGB 7.5* 7.2* 5.1* 5.3* 7.5* 6.6*  HCT 22.9* 21.9* 15.0* 16.8* 23.3* 19.9*  MCV 83.9 83.2  --  85.6 85.8 84.3  PLT 172 219  --  432 402 380    Cardiac Enzymes: No results for input(s): CKTOTAL, CKMB, CKMBINDEX, TROPONINI in the last 168 hours.  BNP: Invalid input(s): POCBNP  CBG:  Recent Labs Lab 05/12/16 2045 05/13/16 0747 05/13/16 1204 05/13/16 1644 05/13/16 2144  GLUCAP 117* 82 102* 109* 103*    Microbiology: Results for orders placed or performed in visit on 04/23/16  Urine culture     Status: None   Collection Time: 04/23/16 11:38 AM  Result Value Ref Range Status   Specimen Description URINE, CLEAN CATCH  Final   Special Requests NONE  Final   Culture NO GROWTH Performed at [facility]   Final   Report Status 04/24/2016 FINAL  Final    Coagulation Studies: No results for input(s): LABPROT, INR in the last 72 hours.  Urinalysis: No results for input(s): COLORURINE, LABSPEC, PHURINE, GLUCOSEU, HGBUR, BILIRUBINUR, KETONESUR, PROTEINUR, UROBILINOGEN, NITRITE, LEUKOCYTESUR in the last 72 hours.  Invalid input(s): APPERANCEUR    Imaging: No results found.   Medications:   .  sodium bicarbonate  infusion 1000 mL 75 mL/hr at 05/14/16 0806   . atorvastatin  40 mg Oral q1800  . gabapentin  600 mg Oral QHS  . insulin aspart  0-5 Units Subcutaneous QHS  . insulin aspart  0-9 Units Subcutaneous TID WC  . insulin glargine  15 Units Subcutaneous QHS  . pantoprazole  40 mg Oral Daily  . patiromer  8.4 g Oral Daily  . sodium chloride flush  3 mL Intravenous Q12H  . traMADol  50 mg Oral Q12H  . Vitamin D-3  1 capsule Oral q morning - 10a   acetaminophen **OR** acetaminophen, albuterol, docusate sodium, ondansetron **OR** ondansetron (ZOFRAN) IV, oxybutynin  Assessment/ Plan:  75 y.o.black female with long-standing hypertension, diabetes type 2, hyperlipidemia,  osteoarthritis, obstructive sleep apnea, bladder cancer 8/17 s/p TURBT and radiation therapy/chemo with gemcitabine.  1. Acute renal failure on chronic kidney disease stage IV with hyperkalemia and metabolic acidosis: status post cystoscopy.  - Continue IV bicarb gtt - Low threshold for hemodialysis. Unclear if patient would be a good candidate for long term dialysis.   - patiromer ordered for hyperkalemia  2. Anemia of chronic kidney disease. Status post PRBC transfusion 2 units on 10/24. Hemoglobin now down to 6.6  - Avoiding epogen due to malignancy - Appreciate oncology input   LOS: [place], Vaughan Garfinkle 10/26/201710:01 AM

## 2016-05-15 ENCOUNTER — Ambulatory Visit: Payer: Medicare Other

## 2016-05-15 ENCOUNTER — Ambulatory Visit
Admit: 2016-05-15 | Discharge: 2016-05-15 | Disposition: A | Discharge status: Home / Self Care | Payer: Medicare Other | Attending: Radiation Oncology | Admitting: Radiation Oncology

## 2016-05-15 LAB — BASIC METABOLIC PANEL
Anion gap: 8 (ref 5–15)
BUN: 75 mg/dL — AB (ref 6–20)
CALCIUM: 8 mg/dL — AB (ref 8.9–10.3)
CHLORIDE: 105 mmol/L (ref 101–111)
CO2: 24 mmol/L (ref 22–32)
CREATININE: 4.5 mg/dL — AB (ref 0.44–1.00)
GFR, EST AFRICAN AMERICAN: 10 mL/min — AB (ref >60)
GFR, EST NON AFRICAN AMERICAN: 9 mL/min — AB (ref >60)
Glucose, Bld: 91 mg/dL (ref 65–99)
Potassium: 4 mmol/L (ref 3.5–5.1)
SODIUM: 137 mmol/L (ref 135–145)

## 2016-05-15 LAB — HEMOGLOBIN AND HEMATOCRIT, BLOOD
HEMATOCRIT: 23.4 % — AB (ref 35.0–47.0)
HEMOGLOBIN: 8.1 g/dL — AB (ref 12.0–16.0)

## 2016-05-15 LAB — GLUCOSE, CAPILLARY: GLUCOSE-CAPILLARY: 94 mg/dL (ref 65–99)

## 2016-05-15 MED ORDER — PATIROMER SORBITEX CALCIUM 8.4 G PO PACK: 8.4000 g | PACK | Freq: Every day | ORAL | 1 refills | Status: DC

## 2016-05-15 MED ORDER — HEPARIN SOD (PORK) LOCK FLUSH 100 UNIT/ML IV SOLN
500.0000 [IU] | Freq: Once | INTRAVENOUS | Status: AC
  Administered 2016-05-15: 500 [IU] via INTRAVENOUS
  Filled 2016-05-15 (×2): qty 5

## 2016-05-15 NOTE — Progress Notes (Signed)
Patient portacath flushed with heparin 100 unit/ ml-5 mls given and portacath deaccessed. Patient given discharge instructions, no acute distress noted.

## 2016-05-15 NOTE — Discharge Summary (Signed)
[place] [facility] [place] NAME: Barbara [name]    MR#:  [id number]  DATE OF BIRTH:  1941/03/09  DATE OF ADMISSION:  05/12/2016 ADMITTING PHYSICIAN: Hollice Espy, MD  DATE OF DISCHARGE: 05/15/2016  PRIMARY CARE PHYSICIAN: Madelyn Brunner, MD    ADMISSION DIAGNOSIS:  left hydronephrosis,bladder cancer  DISCHARGE DIAGNOSIS:  Active Problems:   Hyperkalemia   SECONDARY DIAGNOSIS:   Past Medical History:  Diagnosis Date  . Anemia associated with chronic renal failure 2017   blood transfusion last week 10/17  . Arthritis   . Cancer ([place]) 2017   bladder  . Chronic kidney disease   . 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  dr. Candiss Norse and dr. Holley Raring follow her  . COPD (chronic obstructive pulmonary disease) ([place])   . Diabetes mellitus without complication ([place])   . Dyspnea    with exertion  . GERD (gastroesophageal reflux disease)   . Hypertension   . Sleep apnea    uses cpap  . Urinary obstruction 01/2016    HOSPITAL COURSE:   75 year old female with past medical history of bladder cancer, chronic kidney disease stage III, hypertension, Obstructive sleep apnea, COPD, anemia of chronic disease who presented to the hospital after a cystoscopy and noted to be anemic and hyperkalemic  1. Hyperkalemia-in the setting of chronic kidney disease stage IV.  - pt. Received some Veltassa and bicarb drip and her Potassium has normalized now.  - she is being discharged on Veltassa.   2. Symptomatic Anemia - pt. 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  -Patient was transfused a total of 2 units of packed red blood cells in the hospital and Hg. Has improved and is 8.1 on the day of discharge. She clinically feels much better.   3. Acute on chronic renal failure-baseline creatinine between 2-3. Patient's creatinine went up as high as 4.7. -Patient was seen by nephrology and placed on a sodium bicarbonate drip. She had no acute  indication for hemodialysis while in the hospital. She is being discharged home with close follow-up with nephrology as an outpatient, as she will likely need dialysis in the near future.  4. DM type II w/out complication - patient will resume her Lantus upon discharge. Her blood sugars remained stable while in the hospital.  5. Urinary Incontinence - she will cont. Ditropan.   6. GERD - she will cont. Nexium.   7. Hx of Bladder Cancer - currently getting chemo/radi.  - cont. Further care as per Hem/Onc/Urology as outpatient.   8. DM neuropathy - she will cont. Neurontin.   9. Hyperlipidemia - she will cont. Atorvastatin  Pt. Is being discharged home with Home health PT/RN/Aide.   DISCHARGE CONDITIONS:   Stable.   CONSULTS OBTAINED:  Treatment Team:  Lytle Butte, MD Lavonia Dana, MD  DRUG ALLERGIES:   Allergies  Allergen Reactions  . Percocet [Oxycodone-Acetaminophen] Hives    DISCHARGE MEDICATIONS:     Medication List    STOP taking these medications   lidocaine-prilocaine cream Commonly known as:  EMLA   mirabegron ER 25 MG Tb24 tablet Commonly known as:  MYRBETRIQ     TAKE these medications   acetaminophen 325 MG tablet Commonly known as:  TYLENOL Take 2 tablets (650 mg total) by mouth every 6 (six) hours as needed for mild pain.   atorvastatin 40 MG tablet Commonly known as:  LIPITOR Take 40 mg by mouth daily  at 6 PM.   docusate sodium 100 MG capsule Commonly known as:  COLACE Take 1 capsule (100 mg total) by mouth 2 (two) times daily as needed for mild constipation.   dronabinol 2.5 MG capsule Commonly known as:  MARINOL Take 1 capsule (2.5 mg total) by mouth 2 (two) times daily before lunch and supper.   esomeprazole 40 MG capsule Commonly known as:  NEXIUM Take 40 mg by mouth daily at 12 noon.   furosemide 40 MG tablet Commonly known as:  LASIX Take 40 mg by mouth 2 (two) times daily.   gabapentin 300 MG capsule Commonly known  as:  NEURONTIN Take 600 mg by mouth 3 (three) times daily.   insulin glargine 100 unit/mL Sopn Commonly known as:  LANTUS Inject into the skin at bedtime. 38 units every am & 36 units every evening   NON FORMULARY Place 1 Units into the nose at bedtime. CPAP Time of use 2100-0600   oxybutynin 5 MG tablet Commonly known as:  DITROPAN Take 1 tablet (5 mg total) by mouth every 8 (eight) hours as needed for bladder spasms.   patiromer 8.4 g packet Commonly known as:  VELTASSA Take 1 packet (8.4 g total) by mouth daily.   PROAIR RESPICLICK 992 (90 Base) MCG/ACT Aepb Generic drug:  Albuterol Sulfate Inhale 2 puffs into the lungs 4 (four) times daily as needed (wheezing).   traMADol 50 MG tablet Commonly known as:  ULTRAM Take 1 tablet (50 mg total) by mouth every 8 (eight) hours as needed.   Vitamin D-3 1000 units Caps Take 1 capsule by mouth every morning.         DISCHARGE INSTRUCTIONS:   DIET:  Cardiac diet and Diabetic diet  DISCHARGE CONDITION:  Stable  ACTIVITY:  Activity as tolerated  OXYGEN:  Home Oxygen: Yes.     Oxygen Delivery: 2 liters/min via Patient connected to nasal cannula oxygen  DISCHARGE LOCATION:  Home with home health PT, RN, Aide   If you experience worsening of your admission symptoms, develop shortness of breath, life threatening emergency, suicidal or homicidal thoughts you must seek medical attention immediately by calling 911 or calling your MD immediately  if symptoms less severe.  You Must read complete instructions/literature along with all the possible adverse reactions/side effects for all the Medicines you take and that have been prescribed to you. Take any new Medicines after you have completely understood and accpet all the possible adverse reactions/side effects.   Please note  You were cared for by a hospitalist during your hospital stay. If you have any questions about your discharge medications or the care you received while  you were in the hospital after you are discharged, you can call the unit and asked to speak with the hospitalist on call if the hospitalist that took care of you is not available. Once you are discharged, your primary care physician will handle any further medical issues. Please note that NO REFILLS for any discharge medications will be authorized once you are discharged, as it is imperative that you return to your primary care physician (or establish a relationship with a primary care physician if you do not have one) for your aftercare needs so that they can reassess your need for medications and monitor your lab values.     Today   No acute events overnight. Hemoglobin stable today. No complaints and wants to go home.  VITAL SIGNS:  Blood pressure (!) 111/56, pulse 82, temperature 98.3 F (36.8  C), temperature source Oral, resp. rate (!) 21, height 5' 8"  (1.727 m), weight 79.3 kg (174 lb 14.4 oz), SpO2 97 %.  I/O:   Intake/Output Summary (Last 24 hours) at 05/15/16 1356 Last data filed at 05/15/16 1208  Gross per 24 hour  Intake          2409.73 ml  Output             1175 ml  Net          1234.73 ml    PHYSICAL EXAMINATION:  GENERAL:  75 y.o.-year-old patient lying in the bed with no acute distress.  EYES: Pupils equal, round, reactive to light and accommodation. No scleral icterus. Extraocular muscles intact.  HEENT: Head atraumatic, normocephalic. Oropharynx and nasopharynx clear.  NECK:  Supple, no jugular venous distention. No thyroid enlargement, no tenderness.  LUNGS: Normal breath sounds bilaterally, no wheezing, rales,rhonchi. No use of accessory muscles of respiration.  CARDIOVASCULAR: S1, S2 normal. No murmurs, rubs, or gallops.  ABDOMEN: Soft, non-tender, non-distended. Bowel sounds present. No organomegaly or mass.  EXTREMITIES: No pedal edema, cyanosis, or clubbing.  NEUROLOGIC: Cranial nerves II through XII are intact. No focal motor or sensory defecits b/l.   PSYCHIATRIC: The patient is alert and oriented x 3. Good affect.  SKIN: No obvious rash, lesion, or ulcer.   DATA REVIEW:   CBC  Recent Labs Lab 05/14/16 0512 05/15/16 0548  WBC 14.9*  --   HGB 6.6* 8.1*  HCT 19.9* 23.4*  PLT 380  --     Chemistries   Recent Labs Lab 05/15/16 0548  NA 137  K 4.0  CL 105  CO2 24  GLUCOSE 91  BUN 75*  CREATININE 4.50*  CALCIUM 8.0*    Cardiac Enzymes No results for input(s): TROPONINI in the last 168 hours.  Microbiology Results  Results for orders placed or performed in visit on 04/23/16  Urine culture     Status: None   Collection Time: 04/23/16 11:38 AM  Result Value Ref Range Status   Specimen Description URINE, CLEAN CATCH  Final   Special Requests NONE  Final   Culture NO GROWTH Performed at [facility]   Final   Report Status 04/24/2016 FINAL  Final    RADIOLOGY:  No results found.    Management plans discussed with the patient, family and they are in agreement.  CODE STATUS:     Code Status Orders        Start     Ordered   05/12/16 1432  Full code  Continuous     05/12/16 1432    Code Status History    Date Active Date Inactive Code Status Order ID Comments User Context   05/04/2016  2:55 PM 05/09/2016  6:28 PM Full Code [id number]  Epifanio Lesches, MD ED   04/17/2016  1:36 PM 04/20/2016  8:41 PM Full Code [id number]  Demetrios Loll, MD Inpatient   03/04/2016  1:58 PM 03/10/2016  2:40 PM Full Code [id number]  Hillary Bow, MD ED   01/21/2016  9:15 PM 01/27/2016  6:01 PM Full Code [id number]  Edwin Dada, MD Inpatient   01/20/2016  6:41 PM 01/21/2016  9:15 PM Full Code [id number]  Theodoro Grist, MD Inpatient   12/18/2015  2:14 AM 12/23/2015  4:51 PM Full Code [id number]  Saundra Shelling, MD Inpatient      TOTAL TIME TAKING CARE OF THIS PATIENT: 40 minutes.    Henreitta Leber M.D on 05/15/2016 at 1:56  PM  Between 7am to 6pm - Pager - [phone]  After 6pm go to [email or web address] - [place]  [facility]  Office  [phone]  CC: Primary care physician; Madelyn Brunner, MD

## 2016-05-15 NOTE — Care Management Important Message (Signed)
Important Message  Patient Details  Name: Barbara [name] MRN: [id number] Date of Birth: 10-21-1940   Medicare Important Message Given:  Yes    [name], RN 05/15/2016, 11:45 AM

## 2016-05-15 NOTE — Progress Notes (Signed)
Patient discharged to home as ordered. Patient instructed to stop taking Veltassa, lidocaine-prilocaine, Patient given upcoming appointments as ordered. Prescription given to patient as ordered. IV discontinued site clean dry and intact.

## 2016-05-15 NOTE — Progress Notes (Signed)
PT Cancellation Note  Patient Details Name: Barbara [name] MRN: [id number] DOB: 1941/01/05   Cancelled Treatment:    Reason Eval/Treat Not Completed: Patient at procedure or test/unavailable (Chart reviewed and treatment attempted; patient currently off unit for radiation treatment.  Will re-attempt at later time/date as patient medically appropriate and available.)   Arturo Sofranko H. Owens Shark, PT, DPT, NCS 05/15/16, 10:25 AM [phone]

## 2016-05-15 NOTE — Progress Notes (Signed)
[facility]  ROUNDING NOTE   Subjective:   Husband at bedside.  Radiation therapy today.   UOP 1450  1 unit PRBC yesterday.   Bicarb gtt at 24m/hr  Objective:  Vital signs in last 24 hours:  Temp:  [98.3 F (36.8 C)-98.5 F (36.9 C)] 98.3 F (36.8 C) (10/27 0501) Pulse Rate:  [77-82] 82 (10/27 0501) Resp:  [18-21] 21 (10/27 0501) BP: (111-120)/(46-60) 111/56 (10/27 0501) SpO2:  [97 %-100 %] 97 % (10/27 0501) Weight:  [79.3 kg (174 lb 14.4 oz)] 79.3 kg (174 lb 14.4 oz) (10/27 0633)  Weight change:  Filed Weights   05/12/16 1105 05/15/16 0633  Weight: 77.1 kg (170 lb) 79.3 kg (174 lb 14.4 oz)    Intake/Output: I/O last 3 completed shifts: In: 33300[P.O.:480; I.V.:3046; Blood:320] Out: 17622[Urine:1650]   Intake/Output this shift:  Total I/O In: 240 [P.O.:240] Out: 300 [Urine:300]  Physical Exam: General: No acute distress  Head: Normocephalic, atraumatic. Moist oral mucosal membranes  Eyes: Anicteric  Neck: Supple, trachea midline  Lungs:  Clear to auscultation, normal effort  Heart: S1S2 no rubs  Abdomen:  Soft, nontender, BS present  Extremities: Trace peripheral edema.  Neurologic: Nonfocal, moving all four extremities  Skin: No lesions       Basic Metabolic Panel:  Recent Labs Lab 05/09/16 0500 05/12/16 1233 05/12/16 1414 05/13/16 0433 05/13/16 1354 05/14/16 0512 05/15/16 0548  NA 139 141 139 138  --  137 137  K 5.0 5.7* 6.0* 5.4* 5.1 4.4 4.0  CL 117*  --  114* 114*  --  110 105  CO2 16*  --  16* 15*  --  19* 24  GLUCOSE 106* 102* 109* 92  --  110* 91  BUN 55*  --  83* 82*  --  76* 75*  CREATININE 4.12*  --  4.83* 4.98*  --  4.72* 4.50*  CALCIUM 8.4*  --  8.3* 8.1*  --  7.9* 8.0*    Liver Function Tests: No results for input(s): AST, ALT, ALKPHOS, BILITOT, PROT, ALBUMIN in the last 168 hours. No results for input(s): LIPASE, AMYLASE in the last 168 hours. No results for input(s): AMMONIA in the last 168  hours.  CBC:  Recent Labs Lab 05/09/16 0500 05/12/16 1233 05/12/16 1414 05/13/16 0433 05/14/16 0512 05/15/16 0548  WBC 9.8  --  14.1* 16.4* 14.9*  --   HGB 7.2* 5.1* 5.3* 7.5* 6.6* 8.1*  HCT 21.9* 15.0* 16.8* 23.3* 19.9* 23.4*  MCV 83.2  --  85.6 85.8 84.3  --   PLT 219  --  432 402 380  --     Cardiac Enzymes: No results for input(s): CKTOTAL, CKMB, CKMBINDEX, TROPONINI in the last 168 hours.  BNP: Invalid input(s): POCBNP  CBG:  Recent Labs Lab 05/14/16 0737 05/14/16 1134 05/14/16 1633 05/14/16 2115 05/15/16 0733  GLUCAP 92 107* 101* 139* 977   Microbiology: Results for orders placed or performed in visit on 04/23/16  Urine culture     Status: None   Collection Time: 04/23/16 11:38 AM  Result Value Ref Range Status   Specimen Description URINE, CLEAN CATCH  Final   Special Requests NONE  Final   Culture NO GROWTH [facility]  Final   Report Status 04/24/2016 FINAL  Final    Coagulation Studies: No results for input(s): LABPROT, INR in the last 72 hours.  Urinalysis: No results for input(s): COLORURINE, LABSPEC, [place]  UROBILINOGEN, NITRITE, LEUKOCYTESUR in the last 72 hours.  Invalid input(s): APPERANCEUR    Imaging: No results found.   Medications:   .  sodium bicarbonate  infusion 1000 mL 75 mL/hr at 05/14/16 2051   . atorvastatin  40 mg Oral q1800  . gabapentin  600 mg Oral QHS  . heparin lock flush  500 Units Intravenous Once  . insulin aspart  0-5 Units Subcutaneous QHS  . insulin aspart  0-9 Units Subcutaneous TID WC  . insulin glargine  15 Units Subcutaneous QHS  . pantoprazole  40 mg Oral Daily  . patiromer  8.4 g Oral Daily  . sodium chloride flush  3 mL Intravenous Q12H  . traMADol  50 mg Oral Q12H  . Vitamin D-3  1 capsule Oral q morning - 10a   acetaminophen **OR** acetaminophen, albuterol, docusate sodium, ondansetron **OR** ondansetron (ZOFRAN) IV,  oxybutynin  Assessment/ Plan:  75 y.o.black female with long-standing hypertension, diabetes type 2, hyperlipidemia, osteoarthritis, obstructive sleep apnea, bladder cancer 8/17 s/p TURBT and radiation therapy/chemo with gemcitabine.  1. Acute renal failure on chronic kidney disease stage IV with hyperkalemia and metabolic acidosis: status post cystoscopy.  - Low threshold for hemodialysis. Unclear if patient would be a good candidate for long term dialysis.  Discuss with family and patient. At this time, they want to wait to start dialysis.  - patiromer ordered for hyperkalemia  2. Anemia of chronic kidney disease. Status post PRBC transfusion 2 units on 10/24 and 1 unit PRBC on 10/26  - Avoiding epogen due to malignancy - Appreciate oncology input  Follow up with Dr. Candiss Norse on 11/10 at 11:30am   LOS: [place], [place] 10/27/20171:03 PM

## 2016-05-15 NOTE — Care Management (Signed)
Patient admitted with hyperkalemia and symptomatic anemia.  Patient lives at home with her husband.  Pharmacy [facility].  Has RW and cane in the home.  Nocturnal O2 through [employer].  PT has assessed patient and recommended home health PT.  Previous admission referral was made to [facility].  Due to readmission patient was not opened to services.  Agreeable to initiate services with [facility] at discharge.  Leory Plowman with [facility] notified of discharge . RNCM signing off

## 2016-05-16 LAB — TYPE AND SCREEN
ABO/RH(D): O POS
Antibody Screen: NEGATIVE
UNIT DIVISION: 0
UNIT DIVISION: 0
Unit division: 0

## 2016-05-18 ENCOUNTER — Ambulatory Visit: Payer: Medicare Other

## 2016-05-18 ENCOUNTER — Ambulatory Visit
Admission: RE | Admit: 2016-05-18 | Discharge: 2016-05-18 | Disposition: A | Discharge status: Home / Self Care | Payer: Medicare Other | Source: Ambulatory Visit | Attending: Radiation Oncology | Admitting: Radiation Oncology

## 2016-05-18 DIAGNOSIS — E119 Type 2 diabetes mellitus without complications: Secondary | ICD-10-CM | POA: Diagnosis not present

## 2016-05-18 DIAGNOSIS — Z794 Long term (current) use of insulin: Secondary | ICD-10-CM | POA: Diagnosis not present

## 2016-05-18 DIAGNOSIS — Z7982 Long term (current) use of aspirin: Secondary | ICD-10-CM | POA: Diagnosis not present

## 2016-05-18 DIAGNOSIS — Z510 Encounter for antineoplastic radiation therapy: Secondary | ICD-10-CM | POA: Diagnosis not present

## 2016-05-18 DIAGNOSIS — C670 Malignant neoplasm of trigone of bladder: Secondary | ICD-10-CM | POA: Diagnosis not present

## 2016-05-18 DIAGNOSIS — Z79899 Other long term (current) drug therapy: Secondary | ICD-10-CM | POA: Diagnosis not present

## 2016-05-18 DIAGNOSIS — I129 Hypertensive chronic kidney disease with stage 1 through stage 4 chronic kidney disease, or unspecified chronic kidney disease: Secondary | ICD-10-CM | POA: Diagnosis not present

## 2016-05-18 DIAGNOSIS — Z809 Family history of malignant neoplasm, unspecified: Secondary | ICD-10-CM | POA: Diagnosis not present

## 2016-05-18 DIAGNOSIS — M129 Arthropathy, unspecified: Secondary | ICD-10-CM | POA: Diagnosis not present

## 2016-05-18 DIAGNOSIS — R197 Diarrhea, unspecified: Secondary | ICD-10-CM | POA: Diagnosis not present

## 2016-05-18 DIAGNOSIS — J449 Chronic obstructive pulmonary disease, unspecified: Secondary | ICD-10-CM | POA: Diagnosis not present

## 2016-05-18 DIAGNOSIS — N189 Chronic kidney disease, unspecified: Secondary | ICD-10-CM | POA: Diagnosis not present

## 2016-05-19 ENCOUNTER — Ambulatory Visit
Admission: RE | Admit: 2016-05-19 | Discharge: 2016-05-19 | Disposition: A | Discharge status: Home / Self Care | Payer: Medicare Other | Source: Ambulatory Visit | Attending: Radiation Oncology | Admitting: Radiation Oncology

## 2016-05-19 ENCOUNTER — Ambulatory Visit: Payer: Medicare Other

## 2016-05-19 DIAGNOSIS — C670 Malignant neoplasm of trigone of bladder: Secondary | ICD-10-CM | POA: Diagnosis not present

## 2016-05-20 ENCOUNTER — Ambulatory Visit
Admission: RE | Admit: 2016-05-20 | Discharge: 2016-05-20 | Disposition: A | Discharge status: Home / Self Care | Payer: Medicare Other | Source: Ambulatory Visit | Attending: Radiation Oncology | Admitting: Radiation Oncology

## 2016-05-20 ENCOUNTER — Ambulatory Visit: Payer: Medicare Other

## 2016-05-20 DIAGNOSIS — C670 Malignant neoplasm of trigone of bladder: Secondary | ICD-10-CM | POA: Diagnosis not present

## 2016-05-21 ENCOUNTER — Ambulatory Visit: Payer: Medicare Other

## 2016-05-21 ENCOUNTER — Ambulatory Visit
Admission: RE | Admit: 2016-05-21 | Discharge: 2016-05-21 | Disposition: A | Discharge status: Home / Self Care | Payer: Medicare Other | Source: Ambulatory Visit | Attending: Radiation Oncology | Admitting: Radiation Oncology

## 2016-05-21 DIAGNOSIS — C670 Malignant neoplasm of trigone of bladder: Secondary | ICD-10-CM | POA: Diagnosis not present

## 2016-05-22 ENCOUNTER — Ambulatory Visit
Admission: RE | Admit: 2016-05-22 | Discharge: 2016-05-22 | Disposition: A | Discharge status: Home / Self Care | Payer: Medicare Other | Source: Ambulatory Visit | Attending: Radiation Oncology | Admitting: Radiation Oncology

## 2016-05-22 ENCOUNTER — Ambulatory Visit: Payer: Medicare Other

## 2016-05-22 DIAGNOSIS — C670 Malignant neoplasm of trigone of bladder: Secondary | ICD-10-CM | POA: Diagnosis not present

## 2016-06-04 ENCOUNTER — Encounter: Payer: Self-pay | Admitting: Emergency Medicine

## 2016-06-04 ENCOUNTER — Inpatient Hospital Stay
Admission: EM | Admit: 2016-06-04 | Discharge: 2016-06-08 | DRG: 638 | Disposition: A | Discharge status: Home / Self Care | Payer: Medicare Other | Attending: Internal Medicine | Admitting: Internal Medicine

## 2016-06-04 DIAGNOSIS — Z8551 Personal history of malignant neoplasm of bladder: Secondary | ICD-10-CM

## 2016-06-04 DIAGNOSIS — Z809 Family history of malignant neoplasm, unspecified: Secondary | ICD-10-CM

## 2016-06-04 DIAGNOSIS — N184 Chronic kidney disease, stage 4 (severe): Secondary | ICD-10-CM | POA: Diagnosis present

## 2016-06-04 DIAGNOSIS — I10 Essential (primary) hypertension: Secondary | ICD-10-CM | POA: Diagnosis present

## 2016-06-04 DIAGNOSIS — E1122 Type 2 diabetes mellitus with diabetic chronic kidney disease: Secondary | ICD-10-CM

## 2016-06-04 DIAGNOSIS — Z79899 Other long term (current) drug therapy: Secondary | ICD-10-CM

## 2016-06-04 DIAGNOSIS — Z794 Long term (current) use of insulin: Secondary | ICD-10-CM

## 2016-06-04 DIAGNOSIS — Z885 Allergy status to narcotic agent status: Secondary | ICD-10-CM

## 2016-06-04 DIAGNOSIS — I2510 Atherosclerotic heart disease of native coronary artery without angina pectoris: Secondary | ICD-10-CM | POA: Diagnosis present

## 2016-06-04 DIAGNOSIS — E871 Hypo-osmolality and hyponatremia: Secondary | ICD-10-CM | POA: Diagnosis present

## 2016-06-04 DIAGNOSIS — K219 Gastro-esophageal reflux disease without esophagitis: Secondary | ICD-10-CM | POA: Diagnosis present

## 2016-06-04 DIAGNOSIS — D631 Anemia in chronic kidney disease: Secondary | ICD-10-CM | POA: Diagnosis present

## 2016-06-04 DIAGNOSIS — G4733 Obstructive sleep apnea (adult) (pediatric): Secondary | ICD-10-CM | POA: Diagnosis present

## 2016-06-04 DIAGNOSIS — Z833 Family history of diabetes mellitus: Secondary | ICD-10-CM

## 2016-06-04 DIAGNOSIS — E162 Hypoglycemia, unspecified: Secondary | ICD-10-CM | POA: Diagnosis present

## 2016-06-04 DIAGNOSIS — E1165 Type 2 diabetes mellitus with hyperglycemia: Secondary | ICD-10-CM | POA: Diagnosis present

## 2016-06-04 DIAGNOSIS — J449 Chronic obstructive pulmonary disease, unspecified: Secondary | ICD-10-CM | POA: Diagnosis present

## 2016-06-04 DIAGNOSIS — I129 Hypertensive chronic kidney disease with stage 1 through stage 4 chronic kidney disease, or unspecified chronic kidney disease: Secondary | ICD-10-CM | POA: Diagnosis present

## 2016-06-04 DIAGNOSIS — N3289 Other specified disorders of bladder: Secondary | ICD-10-CM | POA: Diagnosis present

## 2016-06-04 DIAGNOSIS — Z87891 Personal history of nicotine dependence: Secondary | ICD-10-CM

## 2016-06-04 DIAGNOSIS — E11649 Type 2 diabetes mellitus with hypoglycemia without coma: Principal | ICD-10-CM | POA: Diagnosis present

## 2016-06-04 LAB — CBC WITH DIFFERENTIAL/PLATELET
BASOS ABS: 0.1 10*3/uL (ref 0–0.1)
BASOS PCT: 1 %
EOS ABS: 0.1 10*3/uL (ref 0–0.7)
Eosinophils Relative: 1 %
HEMATOCRIT: 26.4 % — AB (ref 35.0–47.0)
HEMOGLOBIN: 8.5 g/dL — AB (ref 12.0–16.0)
LYMPHS PCT: 6 %
Lymphs Abs: 0.6 10*3/uL — ABNORMAL LOW (ref 1.0–3.6)
MCH: 28.7 pg (ref 26.0–34.0)
MCHC: 32.3 g/dL (ref 32.0–36.0)
MCV: 88.7 fL (ref 80.0–100.0)
MONOS PCT: 4 %
Monocytes Absolute: 0.4 10*3/uL (ref 0.2–0.9)
Neutro Abs: 8 10*3/uL — ABNORMAL HIGH (ref 1.4–6.5)
Neutrophils Relative %: 88 %
Platelets: 192 10*3/uL (ref 150–440)
RBC: 2.97 MIL/uL — ABNORMAL LOW (ref 3.80–5.20)
RDW: 19.8 % — AB (ref 11.5–14.5)
WBC: 9.1 10*3/uL (ref 3.6–11.0)

## 2016-06-04 LAB — COMPREHENSIVE METABOLIC PANEL
ALBUMIN: 2.6 g/dL — AB (ref 3.5–5.0)
ALK PHOS: 67 U/L (ref 38–126)
ALT: 26 U/L (ref 14–54)
ANION GAP: 8 (ref 5–15)
AST: 34 U/L (ref 15–41)
BILIRUBIN TOTAL: 0.6 mg/dL (ref 0.3–1.2)
BUN: 62 mg/dL — AB (ref 6–20)
CO2: 18 mmol/L — AB (ref 22–32)
Calcium: 8.1 mg/dL — ABNORMAL LOW (ref 8.9–10.3)
Chloride: 113 mmol/L — ABNORMAL HIGH (ref 101–111)
Creatinine, Ser: 4.85 mg/dL — ABNORMAL HIGH (ref 0.44–1.00)
GFR calc Af Amer: 9 mL/min — ABNORMAL LOW (ref >60)
GFR calc non Af Amer: 8 mL/min — ABNORMAL LOW (ref >60)
GLUCOSE: 70 mg/dL (ref 65–99)
POTASSIUM: 4.5 mmol/L (ref 3.5–5.1)
SODIUM: 139 mmol/L (ref 135–145)
Total Protein: 8.1 g/dL (ref 6.5–8.1)

## 2016-06-04 LAB — GLUCOSE, CAPILLARY
GLUCOSE-CAPILLARY: 70 mg/dL (ref 65–99)
GLUCOSE-CAPILLARY: 23 mg/dL — AB (ref 65–99)
Glucose-Capillary: 23 mg/dL — CL (ref 65–99)

## 2016-06-04 MED ORDER — DEXTROSE 10 % IV SOLN
INTRAVENOUS | Status: DC
  Administered 2016-06-04: 1000 mL via INTRAVENOUS
  Administered 2016-06-05 (×2): via INTRAVENOUS

## 2016-06-04 MED ORDER — SODIUM CHLORIDE 0.9 % IV BOLUS (SEPSIS): 1000.0000 mL | Freq: Once | INTRAVENOUS | Status: DC

## 2016-06-04 MED ORDER — DEXTROSE 50 % IV SOLN
50.0000 mL | Freq: Once | INTRAVENOUS | Status: AC
  Administered 2016-06-04: 50 mL via INTRAVENOUS
  Filled 2016-06-04: qty 50

## 2016-06-04 MED ORDER — SODIUM CHLORIDE 0.9 % IV BOLUS (SEPSIS)
500.0000 mL | Freq: Once | INTRAVENOUS | Status: AC
  Administered 2016-06-04: 500 mL via INTRAVENOUS

## 2016-06-04 NOTE — ED Provider Notes (Signed)
[place] Provider Note   CSN: [id number] Arrival date & time: 06/04/16  1950     History   Chief Complaint Chief Complaint  Patient presents with  . Hypoglycemia    HPI Barbara [name] is a 75 y.o. female hx of anemia, CKD, COPD, DM here with hypoglycemia. Patient was recently admitted had a complicated admission with anemia, hyperkalemia, CK D. Patient was transfused in the hospital and was followed by nephrology. Patient was eventually sent home with insulin for hyperglycemia. Patient was started on glipizide and ditropan (for bladder spasms) yesterday. Took a dose yesterday and another dose today. Has been feeling weak all over today. Has been having poor appetite but no vomiting. Denies fevers or abdominal pain. Has been checking sugars all day and has been around 30. EMS noted CBG 35. Given glucagon prior to arrival. Has been drinking orange juice all day   The history is provided by the patient.    Past Medical History:  Diagnosis Date  . Anemia associated with chronic renal failure 2017   blood transfusion last week 10/17  . Arthritis   . Cancer ([place]) 2017   bladder  . Chronic kidney disease   . 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  dr. Candiss Norse and dr. Holley Raring follow her  . COPD (chronic obstructive pulmonary disease) ([place])   . Diabetes mellitus without complication ([place])   . Dyspnea    with exertion  . GERD (gastroesophageal reflux disease)   . Hypertension   . Sleep apnea    uses cpap  . Urinary obstruction 01/2016    Patient Active Problem List   Diagnosis Date Noted  . Hypoglycemia 06/04/2016  . Hyperkalemia 05/12/2016  . Protein-calorie malnutrition, severe 05/05/2016  . Acute on chronic renal failure ([place]) 05/04/2016  . Seizure ([place]) 04/17/2016  . Palliative care by specialist   . DNR (do not resuscitate) discussion   . C. difficile diarrhea 03/11/2016  . Anemia 03/11/2016  . Hypotension 03/11/2016  . Bladder cancer  ([place]) 03/11/2016  . Acidosis 03/11/2016  . Failure to thrive (child) 03/11/2016  . Weakness generalized 03/11/2016  . ARF (acute renal failure) ([place]) 03/04/2016  . 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 Absolute anemia 02/04/2016  . Airway hyperreactivity 02/04/2016  . Celiac disease 02/04/2016  . Gastric catarrh 02/04/2016  . Acid reflux 02/04/2016  . Combined fat and carbohydrate induced hyperlipemia 02/04/2016  . C. difficile colitis 01/22/2016  . Urinary obstruction 01/21/2016  . COPD (chronic obstructive pulmonary disease) ([place]) 01/21/2016  . Controlled type 2 diabetes mellitus with stage 4 chronic kidney disease, with long-term current use of insulin ([place]) 01/21/2016  . Essential hypertension 01/21/2016  . Acute renal failure superimposed on stage 4 chronic kidney disease ([place]) 01/20/2016  . Anemia in chronic kidney disease 01/20/2016  . Arthritis of knee, degenerative 05/10/2015  . Knee strain 03/26/2015  . Other intervertebral disc displacement, lumbar region 03/04/2015  . Degenerative arthritis of lumbar spine 03/04/2015  . Injury of tendon of upper extremity 11/12/2014  . Atherosclerosis of abdominal aorta ([place]) 11/02/2014  . Chronic kidney disease, stage IV (severe) ([place]) 11/02/2014  . Obstructive apnea 11/02/2014  . Complete rotator cuff rupture of left shoulder 10/05/2014  . Arthritis of shoulder region, degenerative 10/05/2014  . CAD in native artery 12/08/2013  . Benign essential HTN 12/08/2013  . Type 2 diabetes mellitus ([place]) 12/08/2013    Past Surgical History:  Procedure Laterality Date  .  ABDOMINAL HYSTERECTOMY    . CYSTOSCOPY W/ RETROGRADES Bilateral 02/17/2016   Procedure: CYSTOSCOPY WITH RETROGRADE PYELOGRAM;  Surgeon: Hollice Espy, MD;  Location: [facility];  Service: Urology;  Laterality: Bilateral;  . CYSTOSCOPY W/ URETERAL STENT PLACEMENT Left 05/12/2016   Procedure: CYSTOSCOPY WITH STENT REPLACEMENT;  Surgeon: Hollice Espy, MD;   Location: [facility];  Service: Urology;  Laterality: Left;  . CYSTOSCOPY WITH STENT PLACEMENT Left 01/21/2016   Procedure: CYSTOSCOPY WITH double J STENT PLACEMENT;  Surgeon: Franchot Gallo, MD;  Location: [facility];  Service: Urology;  Laterality: Left;  . KIDNEY SURGERY  01/21/2016   IR NEPHROSTOMY PLACEMENT LEFT   . PERIPHERAL VASCULAR CATHETERIZATION N/A 04/07/2016   Procedure: Glori Luis Cath Insertion;  Surgeon: Katha Cabal, MD;  Location: [facility];  Service: Cardiovascular;  Laterality: N/A;  . ROTATOR CUFF REPAIR     both sides  . TRANSURETHRAL RESECTION OF BLADDER TUMOR N/A 02/17/2016   Procedure: TRANSURETHRAL RESECTION OF BLADDER TUMOR (TURBT)-LARGE;  Surgeon: Hollice Espy, MD;  Location: [facility];  Service: Urology;  Laterality: N/A;  . URETEROSCOPY Left 02/17/2016   Procedure: URETEROSCOPY;  Surgeon: Hollice Espy, MD;  Location: [facility];  Service: Urology;  Laterality: Left;    OB History    No data available       Home Medications    Prior to Admission medications   Medication Sig Start Date End Date Taking? Authorizing Provider  acetaminophen (TYLENOL) 325 MG tablet Take 2 tablets (650 mg total) by mouth every 6 (six) hours as needed for mild pain. 05/09/16   Nicholes Mango, MD  Albuterol Sulfate (PROAIR RESPICLICK) 100 (90 Base) MCG/ACT AEPB Inhale 2 puffs into the lungs 4 (four) times daily as needed (wheezing).     Historical Provider, MD  atorvastatin (LIPITOR) 40 MG tablet Take 40 mg by mouth daily at 6 PM.     Historical Provider, MD  Cholecalciferol (VITAMIN D-3) 1000 units CAPS Take 1 capsule by mouth every morning.    Historical Provider, MD  docusate sodium (COLACE) 100 MG capsule Take 1 capsule (100 mg total) by mouth 2 (two) times daily as needed for mild constipation. 05/09/16   Nicholes Mango, MD  dronabinol (MARINOL) 2.5 MG capsule Take 1 capsule (2.5 mg total) by mouth 2 (two) times daily before lunch and supper. Patient not taking: Reported on  05/12/2016 03/11/16   Theodoro Grist, MD  esomeprazole (NEXIUM) 40 MG capsule Take 40 mg by mouth daily at 12 noon.    Historical Provider, MD  furosemide (LASIX) 40 MG tablet Take 40 mg by mouth 2 (two) times daily.    Historical Provider, MD  gabapentin (NEURONTIN) 300 MG capsule Take 600 mg by mouth 3 (three) times daily.    Historical Provider, MD  insulin glargine (LANTUS) 100 unit/mL SOPN Inject into the skin at bedtime. 38 units every am & 36 units every evening    Historical Provider, MD  NON FORMULARY Place 1 Units into the nose at bedtime. CPAP Time of use 2100-0600    Historical Provider, MD  oxybutynin (DITROPAN) 5 MG tablet Take 1 tablet (5 mg total) by mouth every 8 (eight) hours as needed for bladder spasms. 05/09/16   Nicholes Mango, MD  patiromer (VELTASSA) 8.4 g packet Take 1 packet (8.4 g total) by mouth daily. 05/15/16   Henreitta Leber, MD  traMADol (ULTRAM) 50 MG tablet Take 1 tablet (50 mg total) by mouth every 8 (eight) hours as needed. 05/09/16   Illene Silver  Gouru, MD    Family History Family History  Problem Relation Age of Onset  . Diabetes Mother   . Cancer Father   . Breast cancer Neg Hx   . Kidney disease Neg Hx     Social History Social History  Substance Use Topics  . Smoking status: Former Smoker    Types: Cigarettes    Quit date: 07/20/2002  . Smokeless tobacco: Never Used     Comment: 01/22/2016   "  quit smoking many years ago "  . Alcohol use No     Allergies   Percocet [oxycodone-acetaminophen]   Review of Systems Review of Systems  Neurological: Positive for weakness.  All other systems reviewed and are negative.    Physical Exam Updated Vital Signs BP 128/64   Pulse 81   Temp 97.9 F (36.6 C) (Oral)   Resp 16   Ht 5' 8"  (1.727 m)   Wt 174 lb (78.9 kg)   SpO2 99%   BMI 26.46 kg/m   Physical Exam  Constitutional: She is oriented to person, place, and time.  Chronically ill   HENT:  Head: Normocephalic.  Eyes: EOM are normal. Pupils  are equal, round, and reactive to light.  Neck: Normal range of motion. Neck supple.  Cardiovascular: Normal rate, regular rhythm and normal heart sounds.   Pulmonary/Chest: Effort normal and breath sounds normal. No respiratory distress. She has no wheezes.  R chest port in place, no obvious cellulitis   Abdominal: Soft. Bowel sounds are normal.  Musculoskeletal: Normal range of motion.  Neurological: She is alert and oriented to person, place, and time.  Skin: Skin is warm.  Psychiatric: She has a normal mood and affect.  Nursing note and vitals reviewed.    ED Treatments / Results  Labs (all labs ordered are listed, but only abnormal results are displayed) Labs Reviewed  CBC WITH DIFFERENTIAL/PLATELET - Abnormal; Notable for the following:       Result Value   RBC 2.97 (*)    Hemoglobin 8.5 (*)    HCT 26.4 (*)    RDW 19.8 (*)    Neutro Abs 8.0 (*)    Lymphs Abs 0.6 (*)    All other components within normal limits  COMPREHENSIVE METABOLIC PANEL - Abnormal; Notable for the following:    Chloride 113 (*)    CO2 18 (*)    BUN 62 (*)    Creatinine, Ser 4.85 (*)    Calcium 8.1 (*)    Albumin 2.6 (*)    GFR calc non Af Amer 8 (*)    GFR calc Af Amer 9 (*)    All other components within normal limits  GLUCOSE, CAPILLARY - Abnormal; Notable for the following:    Glucose-Capillary 23 (*)    All other components within normal limits  GLUCOSE, CAPILLARY - Abnormal; Notable for the following:    Glucose-Capillary 23 (*)    All other components within normal limits  GLUCOSE, CAPILLARY  CBG MONITORING, ED    EKG  EKG Interpretation None       Radiology No results found.  Procedures Procedures (including critical care time)  CRITICAL CARE Performed by: Wandra Arthurs   Total critical care time: 30 minutes  Critical care time was exclusive of separately billable procedures and treating other patients.  Critical care was necessary to treat or prevent imminent or  life-threatening deterioration.  Critical care was time spent personally by me on the following activities: development of treatment  plan with patient and/or surrogate as well as nursing, discussions with consultants, evaluation of patient's response to treatment, examination of patient, obtaining history from patient or surrogate, ordering and performing treatments and interventions, ordering and review of laboratory studies, ordering and review of radiographic studies, pulse oximetry and re-evaluation of patient's condition.   Medications Ordered in ED Medications  dextrose 10 % infusion (1,000 mLs Intravenous New Bag/Given 06/04/16 2231)  sodium chloride 0.9 % bolus 500 mL (0 mLs Intravenous Stopped 06/04/16 2232)  dextrose 50 % solution 50 mL (50 mLs Intravenous Given 06/04/16 2231)     Initial Impression / Assessment and Plan / ED Course  I have reviewed the triage vital signs and the nursing notes.  Pertinent labs & imaging results that were available during my care of the patient were reviewed by me and considered in my medical decision making (see chart for details).  Clinical Wataga is a 75 y.o. female here with hypoglycemia. CBG on arrival is 70. Will check labs and observe.  10:45 PM CBG dropped to 23. Still alert and oriented. Has been eating crackers this whole time. Given 1 amp D50, will start D10 drip. Will admit for hypoglycemia likely from glipizide use. Labs showed Cr 4.8, baseline. Bicarb 18 likely from dehydration. Given 500 cc bolus.    Final Clinical Impressions(s) / ED Diagnoses   Final diagnoses:  Hypoglycemia    New Prescriptions New Prescriptions   No medications on file     Drenda Freeze, MD 06/04/16 2246

## 2016-06-04 NOTE — ED Notes (Signed)
Port accessed by [employer], Therapist, sports.

## 2016-06-04 NOTE — ED Triage Notes (Signed)
Per EMS patient had an initial glu of 35.  After giving glucagon, recheck was 64.  Triage glu is 70.  Pt has hx of ca and poor appetite today.  Pt in NAD and AOx4.

## 2016-06-04 NOTE — H&P (Signed)
[facility] [place] NAME: Barbara [name]    MR#:  [id number]  DATE OF BIRTH:  Dec 22, 1940  DATE OF ADMISSION:  06/04/2016  PRIMARY CARE PHYSICIAN: Madelyn Brunner, MD   REQUESTING/REFERRING PHYSICIAN: Darl Householder, MD  CHIEF COMPLAINT:   Chief Complaint  Patient presents with  . Hypoglycemia    HISTORY OF PRESENT ILLNESS:  Barbara [name]  is a 75 y.o. female who presents with Hypoglycemia. Patient woke up this morning feeling "strange." She checked her blood sugar and it was in the 50s. She ate some food and recheck it later and it was still low in the 60s. Later on the day she became more lethargic and her family members called EMS and her blood sugar was in the 30s. She was treated with glucagon and dextrose and brought to the ED. Here her blood sugar continued to drift down. She was started on dextrose infusion and hospitals were called for admission. Patient was recently placed on a new anti-glycemic medication, which is believed to be glipizide.  PAST MEDICAL HISTORY:   Past Medical History:  Diagnosis Date  . Anemia associated with chronic renal failure 2017   blood transfusion last week 10/17  . Arthritis   . Cancer ([place]) 2017   bladder  . Chronic kidney disease   . 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  dr. Candiss Norse and dr. Holley Raring follow her  . COPD (chronic obstructive pulmonary disease) ([place])   . Diabetes mellitus without complication ([place])   . Dyspnea    with exertion  . GERD (gastroesophageal reflux disease)   . Hypertension   . Sleep apnea    uses cpap  . Urinary obstruction 01/2016    PAST SURGICAL HISTORY:   Past Surgical History:  Procedure Laterality Date  . ABDOMINAL HYSTERECTOMY    . CYSTOSCOPY W/ RETROGRADES Bilateral 02/17/2016   Procedure: CYSTOSCOPY WITH RETROGRADE PYELOGRAM;  Surgeon: Hollice Espy, MD;  Location: [facility];  Service: Urology;  Laterality: Bilateral;  . CYSTOSCOPY W/  URETERAL STENT PLACEMENT Left 05/12/2016   Procedure: CYSTOSCOPY WITH STENT REPLACEMENT;  Surgeon: Hollice Espy, MD;  Location: [facility];  Service: Urology;  Laterality: Left;  . CYSTOSCOPY WITH STENT PLACEMENT Left 01/21/2016   Procedure: CYSTOSCOPY WITH double J STENT PLACEMENT;  Surgeon: Franchot Gallo, MD;  Location: [facility];  Service: Urology;  Laterality: Left;  . KIDNEY SURGERY  01/21/2016   IR NEPHROSTOMY PLACEMENT LEFT   . PERIPHERAL VASCULAR CATHETERIZATION N/A 04/07/2016   Procedure: Glori Luis Cath Insertion;  Surgeon: Katha Cabal, MD;  Location: [facility];  Service: Cardiovascular;  Laterality: N/A;  . ROTATOR CUFF REPAIR     both sides  . TRANSURETHRAL RESECTION OF BLADDER TUMOR N/A 02/17/2016   Procedure: TRANSURETHRAL RESECTION OF BLADDER TUMOR (TURBT)-LARGE;  Surgeon: Hollice Espy, MD;  Location: [facility];  Service: Urology;  Laterality: N/A;  . URETEROSCOPY Left 02/17/2016   Procedure: URETEROSCOPY;  Surgeon: Hollice Espy, MD;  Location: [facility];  Service: Urology;  Laterality: Left;    SOCIAL HISTORY:   Social History  Substance Use Topics  . Smoking status: Former Smoker    Types: Cigarettes    Quit date: 07/20/2002  . Smokeless tobacco: Never Used     Comment: 01/22/2016   "  quit smoking many years ago "  . Alcohol use No    FAMILY HISTORY:   Family History  Problem Relation Age of Onset  .  Diabetes Mother   . Cancer Father   . Breast cancer Neg Hx   . Kidney disease Neg Hx     DRUG ALLERGIES:   Allergies  Allergen Reactions  . Percocet [Oxycodone-Acetaminophen] Hives    MEDICATIONS AT HOME:   Prior to Admission medications   Medication Sig Start Date End Date Taking? Authorizing Provider  acetaminophen (TYLENOL) 325 MG tablet Take 2 tablets (650 mg total) by mouth every 6 (six) hours as needed for mild pain. 05/09/16   Nicholes Mango, MD  Albuterol Sulfate (PROAIR RESPICLICK) 235 (90 Base) MCG/ACT AEPB Inhale 2 puffs into the lungs  4 (four) times daily as needed (wheezing).     Historical Provider, MD  atorvastatin (LIPITOR) 40 MG tablet Take 40 mg by mouth daily at 6 PM.     Historical Provider, MD  Cholecalciferol (VITAMIN D-3) 1000 units CAPS Take 1 capsule by mouth every morning.    Historical Provider, MD  docusate sodium (COLACE) 100 MG capsule Take 1 capsule (100 mg total) by mouth 2 (two) times daily as needed for mild constipation. 05/09/16   Nicholes Mango, MD  dronabinol (MARINOL) 2.5 MG capsule Take 1 capsule (2.5 mg total) by mouth 2 (two) times daily before lunch and supper. Patient not taking: Reported on 05/12/2016 03/11/16   Theodoro Grist, MD  esomeprazole (NEXIUM) 40 MG capsule Take 40 mg by mouth daily at 12 noon.    Historical Provider, MD  furosemide (LASIX) 40 MG tablet Take 40 mg by mouth 2 (two) times daily.    Historical Provider, MD  gabapentin (NEURONTIN) 300 MG capsule Take 600 mg by mouth 3 (three) times daily.    Historical Provider, MD  insulin glargine (LANTUS) 100 unit/mL SOPN Inject 36-38 Units into the skin at bedtime. 38 units every am & 36 units every evening     Historical Provider, MD  NON FORMULARY Place 1 Units into the nose at bedtime. CPAP Time of use 2100-0600    Historical Provider, MD  oxybutynin (DITROPAN) 5 MG tablet Take 1 tablet (5 mg total) by mouth every 8 (eight) hours as needed for bladder spasms. 05/09/16   Nicholes Mango, MD  patiromer (VELTASSA) 8.4 g packet Take 1 packet (8.4 g total) by mouth daily. 05/15/16   Henreitta Leber, MD  traMADol (ULTRAM) 50 MG tablet Take 1 tablet (50 mg total) by mouth every 8 (eight) hours as needed. 05/09/16   Nicholes Mango, MD    REVIEW OF SYSTEMS:  Review of Systems  Constitutional: Negative for chills, fever, malaise/fatigue and weight loss.  HENT: Negative for ear pain, hearing loss and tinnitus.   Eyes: Negative for blurred vision, double vision, pain and redness.  Respiratory: Negative for cough, hemoptysis and shortness of breath.    Cardiovascular: Negative for chest pain, palpitations, orthopnea and leg swelling.  Gastrointestinal: Negative for abdominal pain, constipation, diarrhea, nausea and vomiting.  Genitourinary: Negative for dysuria, frequency and hematuria.  Musculoskeletal: Negative for back pain, joint pain and neck pain.  Skin:       No acne, rash, or lesions  Neurological: Negative for dizziness, tremors, focal weakness and weakness.  Endo/Heme/Allergies: Negative for polydipsia. Does not bruise/bleed easily.  Psychiatric/Behavioral: Negative for depression. The patient is not nervous/anxious and does not have insomnia.      VITAL SIGNS:   Vitals:   06/04/16 2001 06/04/16 2100 06/04/16 2200 06/04/16 2300  BP: 128/64 114/60 (!) 104/53 (!) 116/54  Pulse: 81 78 81 77  Resp: 16 14  15 14  Temp:      TempSrc:      SpO2: 99% 99% 98% 100%  Weight:      Height:       Wt Readings from Last 3 Encounters:  06/04/16 78.9 kg (174 lb)  05/15/16 79.3 kg (174 lb 14.4 oz)  05/11/16 77.1 kg (170 lb)    PHYSICAL EXAMINATION:  Physical Exam  Vitals reviewed. Constitutional: She is oriented to person, place, and time. She appears well-developed and well-nourished. No distress.  HENT:  Head: Normocephalic and atraumatic.  Mouth/Throat: Oropharynx is clear and moist.  Eyes: Conjunctivae and EOM are normal. Pupils are equal, round, and reactive to light. No scleral icterus.  Neck: Normal range of motion. Neck supple. No JVD present. No thyromegaly present.  Cardiovascular: Normal rate, regular rhythm and intact distal pulses.  Exam reveals no gallop and no friction rub.   No murmur heard. Respiratory: Effort normal and breath sounds normal. No respiratory distress. She has no wheezes. She has no rales.  GI: Soft. Bowel sounds are normal. She exhibits no distension. There is no tenderness.  Musculoskeletal: Normal range of motion. She exhibits no edema.  No arthritis, no gout  Lymphadenopathy:    She has no  cervical adenopathy.  Neurological: She is alert and oriented to person, place, and time. No cranial nerve deficit.  No dysarthria, no aphasia  Skin: Skin is warm and dry. No rash noted. No erythema.  Psychiatric: She has a normal mood and affect. Her behavior is normal. Judgment and thought content normal.    LABORATORY PANEL:   CBC  Recent Labs Lab 06/04/16 2006  WBC 9.1  HGB 8.5*  HCT 26.4*  PLT 192   ------------------------------------------------------------------------------------------------------------------  Chemistries   Recent Labs Lab 06/04/16 2006  NA 139  K 4.5  CL 113*  CO2 18*  GLUCOSE 70  BUN 62*  CREATININE 4.85*  CALCIUM 8.1*  AST 34  ALT 26  ALKPHOS 67  BILITOT 0.6   ------------------------------------------------------------------------------------------------------------------  Cardiac Enzymes No results for input(s): TROPONINI in the last 168 hours. ------------------------------------------------------------------------------------------------------------------  RADIOLOGY:  No results found.  EKG:   Orders placed or performed during the hospital encounter of 05/04/16  . ED EKG within 10 minutes  . ED EKG within 10 minutes  . EKG    IMPRESSION AND PLAN:  Principal Problem:   Hypoglycemia - IV dextrose, 2 two-hour glucose checks, glucagon if dextrose to keep her blood sugar had an appropriate level. Active Problems:   Controlled type 2 diabetes mellitus with stage 4 chronic kidney disease, with long-term current use of insulin (HCC) - holding all anti-glycemic status at this time as above, restart when appropriate   COPD (chronic obstructive pulmonary disease) ([place]) - continue home inhalers   Essential hypertension - stable, continue home meds   CAD in native artery - continue home meds  All the records are reviewed and case discussed with ED provider. Management plans discussed with the patient and/or family.  DVT  PROPHYLAXIS: SubQ heparin  GI PROPHYLAXIS: PPI  ADMISSION STATUS: Observation  CODE STATUS: Full Code Status History    Date Active Date Inactive Code Status Order ID Comments User Context   05/12/2016  2:32 PM 05/15/2016  5:46 PM Full Code [id number]  Lytle Butte, MD Inpatient   05/04/2016  2:55 PM 05/09/2016  6:28 PM Full Code [id number]  Epifanio Lesches, MD ED   04/17/2016  1:36 PM 04/20/2016  8:41 PM Full Code [id number]  Demetrios Loll,  MD Inpatient   03/04/2016  1:58 PM 03/10/2016  2:40 PM Full Code [id number]  Hillary Bow, MD ED   01/21/2016  9:15 PM 01/27/2016  6:01 PM Full Code [id number]  Edwin Dada, MD Inpatient   01/20/2016  6:41 PM 01/21/2016  9:15 PM Full Code [id number]  Theodoro Grist, MD Inpatient   12/18/2015  2:14 AM 12/23/2015  4:51 PM Full Code [id number]  Saundra Shelling, MD Inpatient      TOTAL TIME TAKING CARE OF THIS PATIENT: 40 minutes.    Evone Arseneau FIELDING 06/04/2016, 11:51 PM  [facility]  Office  [phone]  CC: Primary care physician; Madelyn Brunner, MD

## 2016-06-05 DIAGNOSIS — Z794 Long term (current) use of insulin: Secondary | ICD-10-CM | POA: Diagnosis not present

## 2016-06-05 DIAGNOSIS — J449 Chronic obstructive pulmonary disease, unspecified: Secondary | ICD-10-CM | POA: Diagnosis present

## 2016-06-05 DIAGNOSIS — I129 Hypertensive chronic kidney disease with stage 1 through stage 4 chronic kidney disease, or unspecified chronic kidney disease: Secondary | ICD-10-CM | POA: Diagnosis present

## 2016-06-05 DIAGNOSIS — Z833 Family history of diabetes mellitus: Secondary | ICD-10-CM | POA: Diagnosis not present

## 2016-06-05 DIAGNOSIS — E871 Hypo-osmolality and hyponatremia: Secondary | ICD-10-CM | POA: Diagnosis present

## 2016-06-05 DIAGNOSIS — Z809 Family history of malignant neoplasm, unspecified: Secondary | ICD-10-CM | POA: Diagnosis not present

## 2016-06-05 DIAGNOSIS — I2510 Atherosclerotic heart disease of native coronary artery without angina pectoris: Secondary | ICD-10-CM | POA: Diagnosis present

## 2016-06-05 DIAGNOSIS — Z8551 Personal history of malignant neoplasm of bladder: Secondary | ICD-10-CM | POA: Diagnosis not present

## 2016-06-05 DIAGNOSIS — Z79899 Other long term (current) drug therapy: Secondary | ICD-10-CM | POA: Diagnosis not present

## 2016-06-05 DIAGNOSIS — E11649 Type 2 diabetes mellitus with hypoglycemia without coma: Secondary | ICD-10-CM | POA: Diagnosis present

## 2016-06-05 DIAGNOSIS — G4733 Obstructive sleep apnea (adult) (pediatric): Secondary | ICD-10-CM | POA: Diagnosis present

## 2016-06-05 DIAGNOSIS — K219 Gastro-esophageal reflux disease without esophagitis: Secondary | ICD-10-CM | POA: Diagnosis present

## 2016-06-05 DIAGNOSIS — N3289 Other specified disorders of bladder: Secondary | ICD-10-CM | POA: Diagnosis present

## 2016-06-05 DIAGNOSIS — E1122 Type 2 diabetes mellitus with diabetic chronic kidney disease: Secondary | ICD-10-CM | POA: Diagnosis present

## 2016-06-05 DIAGNOSIS — Z885 Allergy status to narcotic agent status: Secondary | ICD-10-CM | POA: Diagnosis not present

## 2016-06-05 DIAGNOSIS — D631 Anemia in chronic kidney disease: Secondary | ICD-10-CM | POA: Diagnosis present

## 2016-06-05 DIAGNOSIS — N184 Chronic kidney disease, stage 4 (severe): Secondary | ICD-10-CM | POA: Diagnosis present

## 2016-06-05 DIAGNOSIS — Z87891 Personal history of nicotine dependence: Secondary | ICD-10-CM | POA: Diagnosis not present

## 2016-06-05 DIAGNOSIS — E1165 Type 2 diabetes mellitus with hyperglycemia: Secondary | ICD-10-CM | POA: Diagnosis present

## 2016-06-05 DIAGNOSIS — E162 Hypoglycemia, unspecified: Secondary | ICD-10-CM | POA: Diagnosis present

## 2016-06-05 LAB — CBC
HEMATOCRIT: 25.6 % — AB (ref 35.0–47.0)
Hemoglobin: 8.4 g/dL — ABNORMAL LOW (ref 12.0–16.0)
MCH: 28.9 pg (ref 26.0–34.0)
MCHC: 32.7 g/dL (ref 32.0–36.0)
MCV: 88.6 fL (ref 80.0–100.0)
PLATELETS: 185 10*3/uL (ref 150–440)
RBC: 2.89 MIL/uL — ABNORMAL LOW (ref 3.80–5.20)
RDW: 20.1 % — AB (ref 11.5–14.5)
WBC: 11.2 10*3/uL — AB (ref 3.6–11.0)

## 2016-06-05 LAB — GLUCOSE, CAPILLARY
GLUCOSE-CAPILLARY: 100 mg/dL — AB (ref 65–99)
GLUCOSE-CAPILLARY: 110 mg/dL — AB (ref 65–99)
GLUCOSE-CAPILLARY: 39 mg/dL — AB (ref 65–99)
GLUCOSE-CAPILLARY: 43 mg/dL — AB (ref 65–99)
GLUCOSE-CAPILLARY: 46 mg/dL — AB (ref 65–99)
GLUCOSE-CAPILLARY: 52 mg/dL — AB (ref 65–99)
GLUCOSE-CAPILLARY: 56 mg/dL — AB (ref 65–99)
GLUCOSE-CAPILLARY: 74 mg/dL (ref 65–99)
GLUCOSE-CAPILLARY: 75 mg/dL (ref 65–99)
GLUCOSE-CAPILLARY: 76 mg/dL (ref 65–99)
GLUCOSE-CAPILLARY: 84 mg/dL (ref 65–99)
GLUCOSE-CAPILLARY: 87 mg/dL (ref 65–99)
Glucose-Capillary: 101 mg/dL — ABNORMAL HIGH (ref 65–99)
Glucose-Capillary: 117 mg/dL — ABNORMAL HIGH (ref 65–99)
Glucose-Capillary: 120 mg/dL — ABNORMAL HIGH (ref 65–99)
Glucose-Capillary: 131 mg/dL — ABNORMAL HIGH (ref 65–99)
Glucose-Capillary: 138 mg/dL — ABNORMAL HIGH (ref 65–99)
Glucose-Capillary: 138 mg/dL — ABNORMAL HIGH (ref 65–99)
Glucose-Capillary: 17 mg/dL — CL (ref 65–99)
Glucose-Capillary: 183 mg/dL — ABNORMAL HIGH (ref 65–99)
Glucose-Capillary: 43 mg/dL — CL (ref 65–99)
Glucose-Capillary: 49 mg/dL — ABNORMAL LOW (ref 65–99)
Glucose-Capillary: 60 mg/dL — ABNORMAL LOW (ref 65–99)
Glucose-Capillary: 67 mg/dL (ref 65–99)
Glucose-Capillary: 67 mg/dL (ref 65–99)
Glucose-Capillary: 84 mg/dL (ref 65–99)

## 2016-06-05 LAB — BASIC METABOLIC PANEL
Anion gap: 5 (ref 5–15)
BUN: 62 mg/dL — AB (ref 6–20)
CHLORIDE: 113 mmol/L — AB (ref 101–111)
CO2: 19 mmol/L — AB (ref 22–32)
CREATININE: 4.46 mg/dL — AB (ref 0.44–1.00)
Calcium: 7.6 mg/dL — ABNORMAL LOW (ref 8.9–10.3)
GFR calc Af Amer: 10 mL/min — ABNORMAL LOW (ref >60)
GFR calc non Af Amer: 9 mL/min — ABNORMAL LOW (ref >60)
Glucose, Bld: 39 mg/dL — CL (ref 65–99)
POTASSIUM: 4.6 mmol/L (ref 3.5–5.1)
SODIUM: 137 mmol/L (ref 135–145)

## 2016-06-05 LAB — MRSA PCR SCREENING: MRSA by PCR: NEGATIVE

## 2016-06-05 MED ORDER — ACETAMINOPHEN 325 MG PO TABS: 650.0000 mg | ORAL_TABLET | Freq: Four times a day (QID) | ORAL | Status: DC | PRN

## 2016-06-05 MED ORDER — DEXTROSE 50 % IV SOLN
1.0000 | Freq: Once | INTRAVENOUS | Status: AC
  Administered 2016-06-05: 09:00:00 50 mL via INTRAVENOUS

## 2016-06-05 MED ORDER — ONDANSETRON HCL 4 MG/2ML IJ SOLN: 4.0000 mg | Freq: Four times a day (QID) | INTRAMUSCULAR | Status: DC | PRN

## 2016-06-05 MED ORDER — PATIROMER SORBITEX CALCIUM 8.4 G PO PACK
8.4000 g | PACK | Freq: Every day | ORAL | Status: DC
  Administered 2016-06-05 – 2016-06-07 (×3): 8.4 g via ORAL
  Filled 2016-06-05 (×6): qty 4

## 2016-06-05 MED ORDER — ATORVASTATIN CALCIUM 20 MG PO TABS
40.0000 mg | ORAL_TABLET | Freq: Every day | ORAL | Status: DC
  Administered 2016-06-05 – 2016-06-07 (×3): 40 mg via ORAL
  Filled 2016-06-05 (×3): qty 2

## 2016-06-05 MED ORDER — PANTOPRAZOLE SODIUM 40 MG PO TBEC
40.0000 mg | DELAYED_RELEASE_TABLET | Freq: Every day | ORAL | Status: DC
  Administered 2016-06-05 – 2016-06-08 (×4): 40 mg via ORAL
  Filled 2016-06-05 (×4): qty 1

## 2016-06-05 MED ORDER — CHLORHEXIDINE GLUCONATE CLOTH 2 % EX PADS
6.0000 | MEDICATED_PAD | Freq: Every day | CUTANEOUS | Status: DC
  Administered 2016-06-05: 6 via TOPICAL

## 2016-06-05 MED ORDER — ALBUTEROL SULFATE (2.5 MG/3ML) 0.083% IN NEBU: 3.0000 mL | INHALATION_SOLUTION | Freq: Four times a day (QID) | RESPIRATORY_TRACT | Status: DC | PRN

## 2016-06-05 MED ORDER — ONDANSETRON HCL 4 MG PO TABS: 4.0000 mg | ORAL_TABLET | Freq: Four times a day (QID) | ORAL | Status: DC | PRN

## 2016-06-05 MED ORDER — DEXTROSE 50 % IV SOLN
INTRAVENOUS | Status: AC
  Administered 2016-06-05: 50 mL via INTRAVENOUS
  Filled 2016-06-05: qty 50

## 2016-06-05 MED ORDER — DEXTROSE 50 % IV SOLN
INTRAVENOUS | Status: AC
  Administered 2016-06-05: 06:00:00
  Filled 2016-06-05: qty 50

## 2016-06-05 MED ORDER — DEXTROSE 50 % IV SOLN
1.0000 | Freq: Once | INTRAVENOUS | Status: AC
  Administered 2016-06-05: 50 mL via INTRAVENOUS

## 2016-06-05 MED ORDER — TRAMADOL HCL 50 MG PO TABS: 50.0000 mg | ORAL_TABLET | Freq: Three times a day (TID) | ORAL | Status: DC | PRN

## 2016-06-05 MED ORDER — DEXTROSE 50 % IV SOLN
INTRAVENOUS | Status: AC
  Filled 2016-06-05: qty 50

## 2016-06-05 MED ORDER — HEPARIN SODIUM (PORCINE) 5000 UNIT/ML IJ SOLN
5000.0000 [IU] | Freq: Three times a day (TID) | INTRAMUSCULAR | Status: DC
  Administered 2016-06-05 – 2016-06-08 (×10): 5000 [IU] via SUBCUTANEOUS
  Filled 2016-06-05 (×9): qty 1

## 2016-06-05 MED ORDER — DEXTROSE 50 % IV SOLN
INTRAVENOUS | Status: AC
  Administered 2016-06-05 (×2): 25 mL
  Filled 2016-06-05: qty 50

## 2016-06-05 MED ORDER — OXYBUTYNIN CHLORIDE 5 MG PO TABS
5.0000 mg | ORAL_TABLET | Freq: Three times a day (TID) | ORAL | Status: DC | PRN
  Administered 2016-06-07 – 2016-06-08 (×4): 5 mg via ORAL
  Filled 2016-06-05 (×5): qty 1

## 2016-06-05 MED ORDER — MUPIROCIN 2 % EX OINT
1.0000 "application " | TOPICAL_OINTMENT | Freq: Two times a day (BID) | CUTANEOUS | Status: DC
  Filled 2016-06-05: qty 22

## 2016-06-05 MED ORDER — ACETAMINOPHEN 650 MG RE SUPP: 650.0000 mg | Freq: Four times a day (QID) | RECTAL | Status: DC | PRN

## 2016-06-05 MED ORDER — FUROSEMIDE 40 MG PO TABS
40.0000 mg | ORAL_TABLET | Freq: Two times a day (BID) | ORAL | Status: DC
  Administered 2016-06-05 – 2016-06-07 (×5): 40 mg via ORAL
  Filled 2016-06-05 (×7): qty 1

## 2016-06-05 MED ORDER — DEXTROSE 50 % IV SOLN
INTRAVENOUS | Status: AC
  Administered 2016-06-05: 50 mL
  Filled 2016-06-05: qty 50

## 2016-06-05 NOTE — ED Notes (Signed)
This RN checked blood sugar and found it to be 23.  MD notified and 1 amp 50 dex IV and 10% dex infusion ordered @ 159m/hr.  Patient also given 4oz. orange juice.

## 2016-06-05 NOTE — Progress Notes (Signed)
Pt has remained alert and oriented with no c/o pain. Lung sounds clear to auscultation. RR even and unlabored. NSR on cardiac monitor. BP WNL. CBG has been q 1 hr requiring D50 to be given-25 ml x 2, 50 ml x 2. Low of 54. Pt has been eating well. Pt is asymptomatic. Husband has been at bedside.

## 2016-06-05 NOTE — Progress Notes (Signed)
Called patients spouse to let him know transferred patient to CCU  He did not answer

## 2016-06-05 NOTE — ED Notes (Signed)
Pt transported to Room 114

## 2016-06-05 NOTE — Care Management (Signed)
Patient presented via ems after becoming more lethargic during the day at home.  Blood sugars were in the 30s.  Lives with her husband and current with pcp. Recently started on new oral glycemic med.   She was admitted to 1C but transferred to icu  stepdown due to continued hypoglycemia. It appears at last discharge 04/2016, patient was referred to [facility].   Is followed by nephrology for chronic kidney disease.

## 2016-06-05 NOTE — ED Notes (Signed)
Pt blood sugar checked by this RN and found to be at 17.  Pt alert and talking at this time.  EDP notified and admitted MD notified.  No d50 in unit, EDP okay'ed pushing 10, 44m syringes of D10 at bedside.  This done by Dr. BOwens Shark  Pt's blood sugar rechecked and is now 87.  Dr. WJannifer Franklinadvised to increase D10 to 1569mhour.  Done by this RN.  Primary RN DaLeanora Coverotified.

## 2016-06-05 NOTE — Progress Notes (Signed)
Per Dr Ara Kussmaul don't give scheduled lasix

## 2016-06-05 NOTE — Progress Notes (Signed)
Per MD verbal order gave 1 amp of D50

## 2016-06-05 NOTE — Progress Notes (Signed)
Patient admitted for hypoglycemia with D10 infusing at 180m/hr.  Patient CBG running in high 40's.  Patient given orange juice with recheck.  CBG Q2h.  Patient sugars continuing to drop and needing supplementation with orange juice to come back within range.

## 2016-06-05 NOTE — Progress Notes (Signed)
Patient ID: Barbara [name], female   DOB: 1940/12/16, 75 y.o.   MRN: [id number]   [place] at [place] NAME: Barbara [name]    MR#:  [id number]  DATE OF BIRTH:  1941/01/09  SUBJECTIVE:  CHIEF COMPLAINT:   Chief Complaint  Patient presents with  . Hypoglycemia  Patient seen and examined at bedside. Case reviewed in care Center. Patient denies cough or complaint at this time despite having blood sugars this morning in the 30s and 40s. She was eating breakfast on my evaluation.  REVIEW OF SYSTEMS:  ROS  CONSTITUTIONAL: No fever/chills, fatigue, weakness, weight gain/loss, headache. EYES: No blurry or double vision. ENT: No tinnitus, postnasal drip, redness or soreness of the oropharynx. RESPIRATORY: No cough, dyspnea, wheeze, hemoptysis.  CARDIOVASCULAR: No chest pain, palpitations, syncope, orthopnea,  GASTROINTESTINAL: No nausea, vomiting, constipation, diarrhea, abdominal pain, hematemesis, melena or hematochezia. GENITOURINARY: No dysuria, frequency, hematuria. ENDOCRINE: No polyuria or nocturia. No heat or cold intolerance. HEMATOLOGY: No anemia, bruising, bleeding. INTEGUMENTARY: No rashes, ulcers, lesions. MUSCULOSKELETAL: No arthritis, gout, dyspnea.  NEUROLOGIC: No numbness, tingling, ataxia, seizure-type activity, weakness. PSYCHIATRIC: No anxiety, depression, insomnia.    DRUG ALLERGIES:   Allergies  Allergen Reactions  . Percocet [Oxycodone-Acetaminophen] Hives   VITALS:  Blood pressure 137/61, pulse 76, temperature 97.8 F (36.6 C), temperature source Oral, resp. rate 15, height 5' 8"  (1.727 m), weight 78 kg (172 lb), SpO2 100 %. PHYSICAL EXAMINATION:  Physical Exam  Vitals reviewed. Constitutional: She is oriented to person, place, and time. She appears well-developed and well-nourished. No distress.  HENT:  Head: Normocephalic and atraumatic.  Mouth/Throat: Oropharynx is clear and moist.  Eyes: Conjunctivae and EOM are  normal. Pupils are equal, round, and reactive to light. No scleral icterus.  Neck: Normal range of motion. Neck supple. No JVD present. No thyromegaly present.  Cardiovascular: Normal rate, regular rhythm and intact distal pulses.  Exam reveals no gallop and no friction rub.   No murmur heard. Respiratory: Effort normal and breath sounds normal. No respiratory distress. She has no wheezes. She has no rales.  GI: Soft. Bowel sounds are normal. She exhibits no distension. There is no tenderness.  Musculoskeletal: Normal range of motion. She exhibits no edema.  No arthritis, no gout  Lymphadenopathy:    She has no cervical adenopathy.  Neurological: She is alert and oriented to person, place, and time. No cranial nerve deficit.  No dysarthria, no aphasia  Skin: Skin is warm and dry. No rash noted. No erythema.  Psychiatric: She has a normal mood and affect. Her behavior is normal. Judgment and thought content normal.   LABORATORY PANEL:   CBC  Recent Labs Lab 06/05/16 0500  WBC 11.2*  HGB 8.4*  HCT 25.6*  PLT 185   ------------------------------------------------------------------------------------------------------------------ Chemistries   Recent Labs Lab 06/04/16 2006 06/05/16 0500  NA 139 137  K 4.5 4.6  CL 113* 113*  CO2 18* 19*  GLUCOSE 70 39*  BUN 62* 62*  CREATININE 4.85* 4.46*  CALCIUM 8.1* 7.6*  AST 34  --   ALT 26  --   ALKPHOS 67  --   BILITOT 0.6  --    RADIOLOGY:  No results found. ASSESSMENT AND PLAN:  This is 75 year old female with history of bladder cancer, CK D stage IV, diabetes, COPD, hypertension who is now admitted with   Hypoglycemia - IV dextrose, every hour glucose checks, patient transferred to stepdown for more intensive monitoring.  Active  Problems:   Controlled type 2 diabetes mellitus with stage 4 chronic kidney disease, with long-term current use of insulin ([place]) - holding all anti-glycemic status at this time as above, restart when  appropriate   COPD (chronic obstructive pulmonary disease) ([place]) - continue home inhalers   Essential hypertension - stable, continue home meds   CAD in native artery - continue home meds3  All the records are reviewed and case discussed with Care Management/Social Worker. Management plans discussed with the patient, family and they are in agreement.  CODE STATUS:  FULLTAL TIME TAKING CARE OF THIS PATIENT: 30 minutes.   More than 50% of the time was spent in counseling/coordination of care: YES  POSSIBLE D/C IN 1-2 DAYS, DEPENDING ON CLINICAL CONDITION.   Bijan Ridgley D.O. on 06/05/2016 at 2:35 PM  Between 7am to 6pm - Pager - [phone]  After 6pm go to [email or web address] - Proofreader  [facility]  Office  [phone]  CC: Primary care physician; Madelyn Brunner, MD  Note: This dictation was prepared with Dragon dictation along with smaller phrase technology. Any transcriptional errors that result from this process are unintentional.

## 2016-06-06 LAB — BASIC METABOLIC PANEL
ANION GAP: 7 (ref 5–15)
Anion gap: 9 (ref 5–15)
BUN: 52 mg/dL — ABNORMAL HIGH (ref 6–20)
BUN: 49 mg/dL — AB (ref 6–20)
CALCIUM: 7.6 mg/dL — AB (ref 8.9–10.3)
CO2: 14 mmol/L — ABNORMAL LOW (ref 22–32)
CO2: 15 mmol/L — ABNORMAL LOW (ref 22–32)
CREATININE: 4.15 mg/dL — AB (ref 0.44–1.00)
Calcium: 7.9 mg/dL — ABNORMAL LOW (ref 8.9–10.3)
Chloride: 103 mmol/L (ref 101–111)
Chloride: 103 mmol/L (ref 101–111)
Creatinine, Ser: 4.06 mg/dL — ABNORMAL HIGH (ref 0.44–1.00)
GFR calc Af Amer: 11 mL/min — ABNORMAL LOW (ref >60)
GFR, EST AFRICAN AMERICAN: 11 mL/min — AB (ref >60)
GFR, EST NON AFRICAN AMERICAN: 10 mL/min — AB (ref >60)
GFR, EST NON AFRICAN AMERICAN: 10 mL/min — AB (ref >60)
Glucose, Bld: 234 mg/dL — ABNORMAL HIGH (ref 65–99)
Glucose, Bld: 123 mg/dL — ABNORMAL HIGH (ref 65–99)
POTASSIUM: 4.3 mmol/L (ref 3.5–5.1)
Potassium: 4.4 mmol/L (ref 3.5–5.1)
SODIUM: 124 mmol/L — AB (ref 135–145)
SODIUM: 127 mmol/L — AB (ref 135–145)

## 2016-06-06 LAB — CBC
HCT: 27.2 % — ABNORMAL LOW (ref 35.0–47.0)
Hemoglobin: 9 g/dL — ABNORMAL LOW (ref 12.0–16.0)
MCH: 29 pg (ref 26.0–34.0)
MCHC: 33.2 g/dL (ref 32.0–36.0)
MCV: 87.2 fL (ref 80.0–100.0)
PLATELETS: 190 10*3/uL (ref 150–440)
RBC: 3.12 MIL/uL — ABNORMAL LOW (ref 3.80–5.20)
RDW: 19.1 % — AB (ref 11.5–14.5)
WBC: 11.4 10*3/uL — AB (ref 3.6–11.0)

## 2016-06-06 LAB — GLUCOSE, CAPILLARY
GLUCOSE-CAPILLARY: 150 mg/dL — AB (ref 65–99)
GLUCOSE-CAPILLARY: 125 mg/dL — AB (ref 65–99)
Glucose-Capillary: 111 mg/dL — ABNORMAL HIGH (ref 65–99)
Glucose-Capillary: 132 mg/dL — ABNORMAL HIGH (ref 65–99)
Glucose-Capillary: 178 mg/dL — ABNORMAL HIGH (ref 65–99)
Glucose-Capillary: 224 mg/dL — ABNORMAL HIGH (ref 65–99)

## 2016-06-06 LAB — MAGNESIUM
MAGNESIUM: 0.8 mg/dL — AB (ref 1.7–2.4)
MAGNESIUM: 1.5 mg/dL — AB (ref 1.7–2.4)

## 2016-06-06 MED ORDER — MAGNESIUM SULFATE IN D5W 1-5 GM/100ML-% IV SOLN
1.0000 g | Freq: Once | INTRAVENOUS | Status: AC
  Administered 2016-06-06: 1 g via INTRAVENOUS
  Filled 2016-06-06: qty 100

## 2016-06-06 MED ORDER — SODIUM CHLORIDE 0.9% FLUSH: 10.0000 mL | INTRAVENOUS | Status: DC | PRN

## 2016-06-06 MED ORDER — MAGNESIUM SULFATE 2 GM/50ML IV SOLN
2.0000 g | Freq: Once | INTRAVENOUS | Status: AC
  Administered 2016-06-06: 2 g via INTRAVENOUS
  Filled 2016-06-06 (×2): qty 50

## 2016-06-06 MED ORDER — SODIUM CHLORIDE 0.9% FLUSH
10.0000 mL | Freq: Two times a day (BID) | INTRAVENOUS | Status: DC
Start: 2016-06-07 — End: 2016-06-08
  Administered 2016-06-07 – 2016-06-08 (×4): 10 mL

## 2016-06-06 NOTE — Progress Notes (Signed)
9 beat run of SVT noted earlier Dr. Jannifer [name] notified when occurred. No further episodes noted. Patient resting quietly in bed at this time. CBG changed to Q4hour. Prior results WNL x7, next check at 0400. D10 continued at 125/hr.

## 2016-06-06 NOTE — Progress Notes (Addendum)
Patient ID: [name], female   DOB: [birth year], 75 y.o.   MRN: [id number]   [place] at [place] NAME: Barbara [name]    MR#:  [id number]  DATE OF BIRTH:  21-Feb-1941  SUBJECTIVE:  CHIEF COMPLAINT:   Chief Complaint  Patient presents with  . Hypoglycemia  Patient seen and examined at bedside. Denies any complaints of chest pain, shortness of breath, nausea vomiting, fevers chills, palpitations, dizziness or lightheadedness. She has been eating and drinking and has urine and stool output.  REVIEW OF SYSTEMS:  ROS  CONSTITUTIONAL: Nofever/chills, fatigue, weakness, weight gain/loss, headache. EYES: No blurry or double vision. ENT: No tinnitus, postnasal drip, redness or soreness of the oropharynx. RESPIRATORY: No cough, dyspnea, wheeze, hemoptysis.  CARDIOVASCULAR: No chest pain, palpitations, syncope, orthopnea,  GASTROINTESTINAL: No nausea, vomiting, constipation, diarrhea, abdominal pain, hematemesis, melena or hematochezia. GENITOURINARY: No dysuria, frequency, hematuria. ENDOCRINE: No polyuria or nocturia. No heat or cold intolerance. HEMATOLOGY: No anemia, bruising, bleeding. INTEGUMENTARY: No rashes, ulcers, lesions. MUSCULOSKELETAL: No arthritis, gout, dyspnea.  NEUROLOGIC: No numbness, tingling, ataxia, seizure-type activity, weakness. PSYCHIATRIC: No anxiety, depression, insomnia.  DRUG ALLERGIES:   Allergies  Allergen Reactions  . Percocet [Oxycodone-Acetaminophen] Hives   VITALS:  Blood pressure (!) 136/39, pulse 80, temperature 98.6 F (37 C), temperature source Oral, resp. rate (!) 24, height 5' 8"  (1.727 m), weight 78 kg (172 lb), SpO2 100 %. PHYSICAL EXAMINATION:  Physical Exam  Constitutional: She is oriented to person, place, and time. She appears well-developedand well-nourished. No distress.  HENT:  Head: Normocephalicand atraumatic.  Mouth/Throat: Oropharynx is clear and moist.  Eyes: Conjunctivaeand EOMare  normal. Pupils are equal, round, and reactive to light. No scleral icterus.  Neck: Normal range of motion. Neck supple. No JVDpresent. No thyromegalypresent.  Cardiovascular: Normal rate, regular rhythmand intact distal pulses. Exam reveals no gallopand no friction rub.  No murmurheard. Respiratory: Effort normaland breath sounds normal. No respiratory distress. She has no wheezes. She has no rales.  GI: Soft. Bowel sounds are normal. She exhibits no distension. There is no tenderness.  Musculoskeletal: Normal range of motion. She exhibits no edema.  Lymphadenopathy:  She has no cervical adenopathy.  Neurological: She is alertand oriented to person, place, and time. No cranial nerve deficit.  Skin: Skin is warmand dry. No rashnoted. No erythema.  Psychiatric: She has a normal mood and affect. Her behavior is normal. Judgmentand thought contentnormal.  LABORATORY PANEL:   CBC  Recent Labs Lab 06/06/16 1123  WBC 11.4*  HGB 9.0*  HCT 27.2*  PLT 190   ------------------------------------------------------------------------------------------------------------------ Chemistries   Recent Labs Lab 06/04/16 2006  06/06/16 1123  NA 139  < > 124*  K 4.5  < > 4.4  CL 113*  < > 103  CO2 18*  < > 14*  GLUCOSE 70  < > 234*  BUN 62*  < > 52*  CREATININE 4.85*  < > 4.06*  CALCIUM 8.1*  < > 7.6*  AST 34  --   --   ALT 26  --   --   ALKPHOS 67  --   --   BILITOT 0.6  --   --   < > = values in this interval not displayed. RADIOLOGY:  No results found. ASSESSMENT AND PLAN:   This is 75 year old female with history of bladder cancer, CKD stage IV, diabetes, COPD, hypertension who is now admitted with  Hypoglycemia, resolved - patient's glucose has been running normal  or high. We'll change to normal saline and continue to observe her blood glucose every 4 hours.  Hyponatremia, likely secondary to fluid administration- fluid restrict and repeat BMP in 6  hours.  Controlled type 2 diabetes mellitus with stage 4 chronic kidney disease, with long-term current use of insulin ([facility]) - holding all anti-glycemic status at this time as above, restart when appropriate  COPD (chronic obstructive pulmonary disease) ([place]) - continue home inhalers  Essential hypertension - stable, continue home meds  CAD in native artery - continue home meds    Anemia, chronic and at baseline.  All the records are reviewed and case discussed with Care Management/Social Worker. Management plans discussed with the patient, family and they are in agreement.  CODE STATUS:  FULLTAL TIME TAKING CARE OF THIS PATIENT: 30 minutes.   More than 50% of the time was spent in counseling/coordination of care: YES  POSSIBLE D/C IN 1-2 DAYS, DEPENDING ON CLINICAL CONDITION.    Breanda Greenlaw D.O. on 06/06/2016 at 1:14 PM  Between 7am to 6pm - Pager - [phone]  After 6pm go to [email or web address] - Proofreader  [facility]  Office  [phone]  CC: Primary care physician; Madelyn Brunner, MD  Note: This dictation was prepared with Dragon dictation along with smaller phrase technology. Any transcriptional errors that result from this process are unintentional.

## 2016-06-06 NOTE — Progress Notes (Signed)
Alert and oriented. Speech is slurred with poor dentition.. Denies pain. Blood sugars Q4hr [id number]. D10 was discontinued at 1300. Very poor appetite. Magnesium was 0.8 and she was replaced with Mag 2Gm IV. Frequent incontinence in diaper. On lasix BID. Husband here much of day.

## 2016-06-07 LAB — GLUCOSE, CAPILLARY
GLUCOSE-CAPILLARY: 133 mg/dL — AB (ref 65–99)
GLUCOSE-CAPILLARY: 108 mg/dL — AB (ref 65–99)
Glucose-Capillary: 111 mg/dL — ABNORMAL HIGH (ref 65–99)
Glucose-Capillary: 107 mg/dL — ABNORMAL HIGH (ref 65–99)

## 2016-06-07 LAB — BASIC METABOLIC PANEL
ANION GAP: 8 (ref 5–15)
ANION GAP: 8 (ref 5–15)
BUN: 53 mg/dL — ABNORMAL HIGH (ref 6–20)
BUN: 54 mg/dL — ABNORMAL HIGH (ref 6–20)
CO2: 17 mmol/L — AB (ref 22–32)
CO2: 16 mmol/L — AB (ref 22–32)
Calcium: 8.8 mg/dL — ABNORMAL LOW (ref 8.9–10.3)
Calcium: 8.6 mg/dL — ABNORMAL LOW (ref 8.9–10.3)
Chloride: 104 mmol/L (ref 101–111)
Chloride: 104 mmol/L (ref 101–111)
Creatinine, Ser: 4.22 mg/dL — ABNORMAL HIGH (ref 0.44–1.00)
Creatinine, Ser: 4.54 mg/dL — ABNORMAL HIGH (ref 0.44–1.00)
GFR calc Af Amer: 11 mL/min — ABNORMAL LOW (ref >60)
GFR calc non Af Amer: 9 mL/min — ABNORMAL LOW (ref >60)
GFR, EST AFRICAN AMERICAN: 10 mL/min — AB (ref >60)
GFR, EST NON AFRICAN AMERICAN: 9 mL/min — AB (ref >60)
GLUCOSE: 121 mg/dL — AB (ref 65–99)
GLUCOSE: 133 mg/dL — AB (ref 65–99)
POTASSIUM: 4.8 mmol/L (ref 3.5–5.1)
POTASSIUM: 4.5 mmol/L (ref 3.5–5.1)
Sodium: 129 mmol/L — ABNORMAL LOW (ref 135–145)
Sodium: 128 mmol/L — ABNORMAL LOW (ref 135–145)

## 2016-06-07 LAB — MAGNESIUM: Magnesium: 2 mg/dL (ref 1.7–2.4)

## 2016-06-07 NOTE — Progress Notes (Signed)
Patient ID: Barbara [name], female   DOB: November 04, 1940, 75 y.o.   MRN: [id number]   [place] at [place] NAME: Barbara [name]    MR#:  [id number]  DATE OF BIRTH:  1941-02-27  SUBJECTIVE:  CHIEF COMPLAINT:   Chief Complaint  Patient presents with  . Hypoglycemia   Patient seen and examined at bedside.Denies any complaints of chest pain, shortness of breath, nausea vomiting, fevers chills, palpitations, dizziness or lightheadedness. She has been eating and drinking and has urine and stool output.  REVIEW OF SYSTEMS:  ROS CONSTITUTIONAL: Nofever/chills, fatigue, weakness, weight gain/loss, headache. EYES: No blurry or double vision. ENT: No tinnitus, postnasal drip, redness or soreness of the oropharynx. RESPIRATORY: No cough, dyspnea, wheeze, hemoptysis.  CARDIOVASCULAR: No chest pain, palpitations, syncope, orthopnea,  GASTROINTESTINAL: No nausea, vomiting, constipation, diarrhea, abdominal pain, hematemesis, melena or hematochezia. GENITOURINARY: No dysuria, frequency, hematuria. ENDOCRINE: No polyuria or nocturia. No heat or cold intolerance. HEMATOLOGY: No anemia, bruising, bleeding. INTEGUMENTARY: No rashes, ulcers, lesions. MUSCULOSKELETAL: No arthritis, gout, dyspnea.  NEUROLOGIC: No numbness, tingling, ataxia, seizure-type activity, weakness. PSYCHIATRIC: No anxiety, depression, insomnia.  DRUG ALLERGIES:   Allergies  Allergen Reactions  . Percocet [Oxycodone-Acetaminophen] Hives   VITALS:  Blood pressure (!) 102/57, pulse 81, temperature 97.6 F (36.4 C), temperature source Oral, resp. rate 18, height 5' 8"  (1.727 m), weight 78 kg (172 lb), SpO2 100 %. PHYSICAL EXAMINATION:  Physical Exam  Constitutional: She is oriented to person, place, and time. She appears well-developedand well-nourished. No distress.  HENT:  Head: Normocephalicand atraumatic.  Mouth/Throat: Oropharynx is clear and moist.  Eyes: Conjunctivaeand EOMare  normal. Pupils are equal, round, and reactive to light. No scleral icterus.  Neck: Normal range of motion. Neck supple. No JVDpresent. No thyromegalypresent.  Cardiovascular: Normal rate, regular rhythmand intact distal pulses. Exam reveals no gallopand no friction rub.  No murmurheard. Respiratory: Effort normaland breath sounds normal. No respiratory distress. She has no wheezes. She has no rales.  GI: Soft. Bowel sounds are normal. She exhibits no distension. There is no tenderness.  Musculoskeletal: Normal range of motion. She exhibits no edema.  Lymphadenopathy:  She has no cervical adenopathy.  Neurological: She is alertand oriented to person, place, and time. No cranial nerve deficit.  Skin: Skin is warmand dry. No rashnoted. No erythema.  Psychiatric: She has a normal mood and affect. Her behavior is normal. Judgmentand thought contentnormal.  LABORATORY PANEL:   CBC  Recent Labs Lab 06/06/16 1123  WBC 11.4*  HGB 9.0*  HCT 27.2*  PLT 190   ------------------------------------------------------------------------------------------------------------------ Chemistries   Recent Labs Lab 06/04/16 2006  06/07/16 0401 06/07/16 0945  NA 139  < >  --  129*  K 4.5  < >  --  4.8  CL 113*  < >  --  104  CO2 18*  < >  --  17*  GLUCOSE 70  < >  --  121*  BUN 62*  < >  --  53*  CREATININE 4.85*  < >  --  4.22*  CALCIUM 8.1*  < >  --  8.8*  MG  --   < > 2.0  --   AST 34  --   --   --   ALT 26  --   --   --   ALKPHOS 67  --   --   --   BILITOT 0.6  --   --   --   < > =  values in this interval not displayed. RADIOLOGY:  No results found. ASSESSMENT AND PLAN:   This is 75 year old female with history of bladder cancer, CKD stage IV, diabetes, COPD, hypertension who is now admitted with  Hypoglycemia, resolved -At present she has been off fluids for 24 hours and has been eating her blood glucose still remains on the low end.  Has not required any insulin  administration she's been here.  Hyponatremia, likely secondary to fluid administration-  improving. Continue fluid restrict.   AK I on CK D. Monitor BMP in a.m.  Controlled type 2 diabetes mellitus with stage 4 chronic kidney disease, with long-term current use of insulin ([facility]) - holding all anti-glycemic status at this time as above, restart when appropriate  COPD (chronic obstructive pulmonary disease) ([place]) - continue home inhalers  Essential hypertension - stable, continue home meds  CAD in native artery - continue home meds    Anemia, chronic and at baseline.  All the records are reviewed and case discussed with Care Management/Social Worker. Management plans discussed with the patient, family and they are in agreement.  CODE STATUS: FULL ToTAL TIME TAKING CARE OF THIS PATIENT: 30 minutes.    More than 50% of the time was spent in counseling/coordination of care: YES  POSSIBLE D/C IN 1 DAYS, DEPENDING ON CLINICAL CONDITION.   Thu Baggett D.O. on 06/07/2016 at 11:28 AM  Between 7am to 6pm - Pager - [phone]  After 6pm go to [email or web address] - Proofreader  [facility]  Office  [phone]  CC: Primary care physician; Madelyn Brunner, MD  Note: This dictation was prepared with Dragon dictation along with smaller phrase technology. Any transcriptional errors that result from this process are unintentional.

## 2016-06-08 LAB — CBC
HCT: 27.3 % — ABNORMAL LOW (ref 35.0–47.0)
Hemoglobin: 8.9 g/dL — ABNORMAL LOW (ref 12.0–16.0)
MCH: 28.5 pg (ref 26.0–34.0)
MCHC: 32.8 g/dL (ref 32.0–36.0)
MCV: 87 fL (ref 80.0–100.0)
PLATELETS: 226 10*3/uL (ref 150–440)
RBC: 3.14 MIL/uL — AB (ref 3.80–5.20)
RDW: 18.9 % — AB (ref 11.5–14.5)
WBC: 9.9 10*3/uL (ref 3.6–11.0)

## 2016-06-08 LAB — GLUCOSE, CAPILLARY
GLUCOSE-CAPILLARY: 98 mg/dL (ref 65–99)
GLUCOSE-CAPILLARY: 106 mg/dL — AB (ref 65–99)
GLUCOSE-CAPILLARY: 99 mg/dL (ref 65–99)
GLUCOSE-CAPILLARY: 99 mg/dL (ref 65–99)
Glucose-Capillary: 101 mg/dL — ABNORMAL HIGH (ref 65–99)

## 2016-06-08 LAB — BASIC METABOLIC PANEL
ANION GAP: 8 (ref 5–15)
BUN: 56 mg/dL — ABNORMAL HIGH (ref 6–20)
CALCIUM: 8.7 mg/dL — AB (ref 8.9–10.3)
CO2: 18 mmol/L — ABNORMAL LOW (ref 22–32)
Chloride: 105 mmol/L (ref 101–111)
Creatinine, Ser: 4.61 mg/dL — ABNORMAL HIGH (ref 0.44–1.00)
GFR calc non Af Amer: 8 mL/min — ABNORMAL LOW (ref >60)
GFR, EST AFRICAN AMERICAN: 10 mL/min — AB (ref >60)
Glucose, Bld: 104 mg/dL — ABNORMAL HIGH (ref 65–99)
Potassium: 4.6 mmol/L (ref 3.5–5.1)
Sodium: 131 mmol/L — ABNORMAL LOW (ref 135–145)

## 2016-06-08 NOTE — Progress Notes (Signed)
Discharge instructions given with verbalized understanding.  Left chest port de accessed per policy and procedure.  Patient taken to visitors entrance via wheelchair by volunteer to be taken home in personal vehicle by husband.

## 2016-06-08 NOTE — Care Management CHF Note (Signed)
Confirmed that patient has home health SN PT and SW services through [facility].  Requested order for resumption of home health orders.  Per primary nurse patient insistent on discharging immediately and left the unit before CM could speak with her.  Notified [facility] of discharge

## 2016-06-08 NOTE — Care Management Important Message (Signed)
Important Message  Patient Details  Name: Barbara [name] MRN: [id number] Date of Birth: 07-29-1940   Medicare Important Message Given:  No Per primary nurse, patient left the nursing unit immediately after discharge order entered and would not wait to speak with CM   Katrina Stack, RN 06/08/2016, 11:08 AM

## 2016-06-08 NOTE — Discharge Instructions (Signed)
Resume diet and activity as before  Do not take insulin or glipizide due to normal blood sugars at this time.  Follow-up with her primary care physician within a week.

## 2016-06-10 ENCOUNTER — Other Ambulatory Visit: Payer: Self-pay | Admitting: Radiation Oncology

## 2016-06-10 ENCOUNTER — Other Ambulatory Visit: Payer: Self-pay | Admitting: *Deleted

## 2016-06-16 NOTE — Discharge Summary (Signed)
[facility] NAME: Barbara [name]    MR#:  [id number]  DATE OF BIRTH:  [birth year]  DATE OF ADMISSION:  06/04/2016 ADMITTING PHYSICIAN: Lance Coon, MD  DATE OF DISCHARGE: 06/08/2016 10:40 AM  PRIMARY CARE PHYSICIAN: Madelyn Brunner, MD   ADMISSION DIAGNOSIS:  Hypoglycemia [E16.2]  DISCHARGE DIAGNOSIS:  Principal Problem:   Hypoglycemia Active Problems:   COPD (chronic obstructive pulmonary disease) (HCC)   Controlled type 2 diabetes mellitus with stage 4 chronic kidney disease, with long-term current use of insulin (HCC)   Essential hypertension   CAD in native artery   Obstructive apnea   SECONDARY DIAGNOSIS:   Past Medical History:  Diagnosis Date  . Anemia associated with chronic renal failure 2017   blood transfusion last week 10/17  . Arthritis   . Cancer ([place]) 2017   bladder  . Chronic kidney disease   . 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  dr. Candiss Norse and dr. Holley Raring follow her  . COPD (chronic obstructive pulmonary disease) ([place])   . Diabetes mellitus without complication ([place])   . Dyspnea    with exertion  . GERD (gastroesophageal reflux disease)   . Hypertension   . Sleep apnea    uses cpap  . Urinary obstruction 01/2016     ADMITTING HISTORY  Barbara [name]  is a 75 y.o. female who presents with Hypoglycemia. Patient woke up this morning feeling "strange." She checked her blood sugar and it was in the 50s. She ate some food and recheck it later and it was still low in the 60s. Later on the day she became more lethargic and her family members called EMS and her blood sugar was in the 30s. She was treated with glucagon and dextrose and brought to the ED. Here her blood sugar continued to drift down. She was started on dextrose infusion and hospitals were called for admission. Patient was recently placed on a new anti-glycemic medication, which is believed to be glipizide.  HOSPITAL COURSE:    * Hypoglycemia Recently had to stop lantus. Continued to take glipizide. Lost 60 lbs and seems to be have helped her DM Initially treated with D10 and later D5. On day of discharge BS better off D5. All DM meds stopped at discharge    COPD (chronic obstructive pulmonary disease) (HCC) - continue home inhalers   Essential hypertension - stable, continue home meds   CAD in native artery - continue home meds  Stable for discharge home  CONSULTS OBTAINED:    DRUG ALLERGIES:   Allergies  Allergen Reactions  . Percocet [Oxycodone-Acetaminophen] Hives    DISCHARGE MEDICATIONS:   Discharge Medication List as of 06/08/2016 10:15 AM    CONTINUE these medications which have NOT CHANGED   Details  acetaminophen (TYLENOL) 325 MG tablet Take 2 tablets (650 mg total) by mouth every 6 (six) hours as needed for mild pain., Starting Sat 05/09/2016, OTC    Albuterol Sulfate (PROAIR RESPICLICK) 381 (90 Base) MCG/ACT AEPB Inhale 2 puffs into the lungs 4 (four) times daily as needed (wheezing). , Historical Med    atorvastatin (LIPITOR) 40 MG tablet Take 40 mg by mouth daily at 6 PM. , Historical Med    docusate sodium (COLACE) 100 MG capsule Take 1 capsule (100 mg total) by mouth 2 (two) times daily as needed for mild constipation., Starting Sat 05/09/2016, Normal    esomeprazole (NEXIUM) 40 MG capsule Take 40  mg by mouth daily at 12 noon., Historical Med    furosemide (LASIX) 40 MG tablet Take 40 mg by mouth 2 (two) times daily., Historical Med    gabapentin (NEURONTIN) 300 MG capsule Take 600 mg by mouth 3 (three) times daily., Historical Med    oxybutynin (DITROPAN) 5 MG tablet Take 1 tablet (5 mg total) by mouth every 8 (eight) hours as needed for bladder spasms., Starting Sat 05/09/2016, Print    patiromer (VELTASSA) 8.4 g packet Take 1 packet (8.4 g total) by mouth daily., Starting Fri 05/15/2016, Print    sodium bicarbonate 650 MG tablet Take 650 mg by mouth 2 (two) times daily.,  Historical Med    traMADol (ULTRAM) 50 MG tablet Take 1 tablet (50 mg total) by mouth every 8 (eight) hours as needed., Starting Sat 05/09/2016, Print    mirabegron ER (MYRBETRIQ) 25 MG TB24 tablet Take 25 mg by mouth daily., Historical Med    NON FORMULARY Place 1 Units into the nose at bedtime. CPAP Time of use 2100-0600, Historical Med      STOP taking these medications     glipiZIDE (GLUCOTROL XL) 5 MG 24 hr tablet      insulin glargine (LANTUS) 100 unit/mL SOPN         Today   VITAL SIGNS:  Blood pressure (!) 111/48, pulse 78, temperature 97.8 F (36.6 C), temperature source Oral, resp. rate 16, height 5' 8"  (1.727 m), weight 78 kg (172 lb), SpO2 97 %.  I/O:  No intake or output data in the 24 hours ending 06/16/16 0240  PHYSICAL EXAMINATION:  Physical Exam  GENERAL:  75 y.o.-year-old patient lying in the bed with no acute distress.  LUNGS: Normal breath sounds bilaterally, no wheezing, rales,rhonchi or crepitation. No use of accessory muscles of respiration.  CARDIOVASCULAR: S1, S2 normal. No murmurs, rubs, or gallops.  ABDOMEN: Soft, non-tender, non-distended. Bowel sounds present. No organomegaly or mass.  NEUROLOGIC: Moves all 4 extremities. PSYCHIATRIC: The patient is alert and oriented x 3.  SKIN: No obvious rash, lesion, or ulcer.   DATA REVIEW:   CBC No results for input(s): WBC, HGB, HCT, PLT in the last 168 hours.  Chemistries  No results for input(s): NA, K, CL, CO2, GLUCOSE, BUN, CREATININE, CALCIUM, MG, AST, ALT, ALKPHOS, BILITOT in the last 168 hours.  Invalid input(s): GFRCGP  Cardiac Enzymes No results for input(s): TROPONINI in the last 168 hours.  Microbiology Results  Results for orders placed or performed during the hospital encounter of 06/04/16  MRSA PCR Screening     Status: None   Collection Time: 06/05/16 11:00 AM  Result Value Ref Range Status   MRSA by PCR NEGATIVE NEGATIVE Final    Comment:        The GeneXpert MRSA Assay  (FDA approved for NASAL specimens only), is one component of a comprehensive MRSA colonization surveillance program. It is not intended to diagnose MRSA infection nor to guide or monitor treatment for MRSA infections.     RADIOLOGY:  No results found.  Follow up with PCP in 1 week.  Management plans discussed with the patient, family and they are in agreement.  CODE STATUS:  Code Status History    Date Active Date Inactive Code Status Order ID Comments User Context   06/05/2016  1:08 AM 06/08/2016  1:45 PM Full Code [id number]  Lance Coon, MD Inpatient   05/12/2016  2:32 PM 05/15/2016  5:46 PM Full Code [id number]  Lytle Butte, MD  Inpatient   05/04/2016  2:55 PM 05/09/2016  6:28 PM Full Code [id number]  Epifanio Lesches, MD ED   04/17/2016  1:36 PM 04/20/2016  8:41 PM Full Code [id number]  Demetrios Loll, MD Inpatient   03/04/2016  1:58 PM 03/10/2016  2:40 PM Full Code [id number]  Hillary Bow, MD ED   01/21/2016  9:15 PM 01/27/2016  6:01 PM Full Code [id number]  Edwin Dada, MD Inpatient   01/20/2016  6:41 PM 01/21/2016  9:15 PM Full Code [id number]  Theodoro Grist, MD Inpatient   12/18/2015  2:14 AM 12/23/2015  4:51 PM Full Code [id number]  Saundra Shelling, MD Inpatient      TOTAL TIME TAKING CARE OF THIS PATIENT ON DAY OF DISCHARGE: more than 30 minutes.   Hillary Bow R M.D on 06/16/2016 at 2:40 AM  Between 7am to 6pm - Pager - [phone]  After 6pm go to [email or web address] - password [email or web address] [facility]  Office  [phone]  CC: Primary care physician; Madelyn Brunner, MD  Note: This dictation was prepared with Dragon dictation along with smaller phrase technology. Any transcriptional errors that result from this process are unintentional.

## 2016-06-24 ENCOUNTER — Ambulatory Visit: Payer: Medicare Other | Attending: Radiation Oncology | Admitting: Radiation Oncology

## 2016-07-01 ENCOUNTER — Encounter: Payer: Self-pay | Admitting: Urology

## 2016-07-01 ENCOUNTER — Ambulatory Visit (INDEPENDENT_AMBULATORY_CARE_PROVIDER_SITE_OTHER): Payer: Medicare Other | Admitting: Urology

## 2016-07-01 VITALS — BP 134/76 | HR 109 | Ht 68.0 in | Wt 153.0 lb

## 2016-07-01 DIAGNOSIS — C679 Malignant neoplasm of bladder, unspecified: Secondary | ICD-10-CM

## 2016-07-01 DIAGNOSIS — N184 Chronic kidney disease, stage 4 (severe): Secondary | ICD-10-CM | POA: Diagnosis not present

## 2016-07-01 DIAGNOSIS — N1330 Unspecified hydronephrosis: Secondary | ICD-10-CM | POA: Diagnosis not present

## 2016-07-01 NOTE — Progress Notes (Signed)
07/01/2016 2:47 PM   Barbara [name] October 28, 1940 [id number]  Referring provider: Madelyn Brunner, MD [facility] [place], [place] [address]  Chief Complaint  Patient presents with  . 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  Since her last visit, she has been readmitted numerous times of multiple issues including hyperglycemia, acute on chronic renal failure.   In the interim, she has lost a good amount of weight, 20 lbs over the past several months, unintentional.  She initally presented with obstructing left UVJ mass in the setting of urosepsis on 01/21/2016.  Prior to this admission, she did have a previous admission for Escherichia coli sepsis with associated left flank and lower abdominal pain.  She was taken to the operating room by Dr. DDiona Fantiat which time a nodular tumor involving the left hemitrigone was appreciated. 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 Due to the distortion of the trigone, the ureteral stent was unable to be placed. She subsequently developed fevers and underwent left percutaneous nephrostomy tube [facility]  She returned to the OR on 02/17/16 for TURBT, B RTG, EUA, and ureteral stent placement.  Pathology consistent with high grade muscle invasive bladder cancer.  Her left UO was able to identified upon resection and stent placed, nephrostomy tube removed.  Clinically T2 on exam under anesthesia.    She was undergoing gemcitabine chemotherapy with concurrent radiation for palliative local control. She is not a candidate for cystectomy. It appears that her last visit with the cancer center was 04/17/2016.  She does have any scheduled follow ups but think that she may have missed a follow up  while in the hospital.    Since then, she's been readmitted to the hospital several times. On one occasion, she was noted to have acute on chronic renal failure. She returns to the operating room on 06/12/2016 for ureteral stent exchange (Bard Optima).  At that point in time, there was no obvious gross residual tumor appreciated.    She does have multiple medical comorbidities including stage IV CKD with recent acute on chronic acute kidney injury, COPD, diabetes, hypertension, OSA.    Smoked 2 ppd, quit ~97.  Today, she feels ok.  She does have urinary frequency, burning and nocturia with is bothersome to her.  She also has had weight loss as above.     PMH: Past Medical History:  Diagnosis Date  . Anemia associated with chronic renal failure 2017   blood transfusion last week 10/17  . Arthritis   . [place]   bladder  . Chronic kidney disease   . 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  dr. sCandiss Norseand dr. lHolley Raringfollow her  . COPD (chronic obstructive pulmonary disease) [place]   . [place]   . Dyspnea    with exertion  . GERD (gastroesophageal reflux disease)   . Hypertension   . Sleep apnea    uses cpap  . Urinary obstruction 01/2016    Surgical History: Past Surgical History:  Procedure Laterality Date  . ABDOMINAL HYSTERECTOMY    . CYSTOSCOPY W/ RETROGRADES Bilateral 02/17/2016   Procedure: CYSTOSCOPY WITH RETROGRADE PYELOGRAM;  Surgeon: AHollice Espy MD;  Location: [facility] ORS;  Service: Urology;  Laterality: Bilateral;  .  CYSTOSCOPY W/ URETERAL STENT PLACEMENT Left 05/12/2016   Procedure: CYSTOSCOPY WITH STENT REPLACEMENT;  Surgeon: Hollice Espy, MD;  Location: [facility] ORS;  Service: Urology;  Laterality: Left;  . CYSTOSCOPY WITH STENT PLACEMENT Left 01/21/2016   Procedure: CYSTOSCOPY WITH double J STENT PLACEMENT;  Surgeon: Franchot Gallo, MD;  Location: [facility] ORS;  Service: Urology;  Laterality: Left;  .  KIDNEY SURGERY  01/21/2016   IR NEPHROSTOMY PLACEMENT LEFT   . PERIPHERAL VASCULAR CATHETERIZATION N/A 04/07/2016   Procedure: Glori Luis Cath Insertion;  Surgeon: Katha Cabal, MD;  Location: [facility];  Service: Cardiovascular;  Laterality: N/A;  . ROTATOR CUFF REPAIR     both sides  . TRANSURETHRAL RESECTION OF BLADDER TUMOR N/A 02/17/2016   Procedure: TRANSURETHRAL RESECTION OF BLADDER TUMOR (TURBT)-LARGE;  Surgeon: Hollice Espy, MD;  Location: [facility];  Service: Urology;  Laterality: N/A;  . URETEROSCOPY Left 02/17/2016   Procedure: URETEROSCOPY;  Surgeon: Hollice Espy, MD;  Location: [facility] OR[facility];  Service: Urology;  Laterality: Left;    Home Medications:    Medication List       Accurate as of 07/01/16  2:47 PM. Always use your most recent med list.          acetaminophen 325 MG tablet Commonly known as:  TYLENOL Take 2 tablets (650 mg total) by mouth every 6 (six) hours as needed for mild pain.   atorvastatin 40 MG tablet Commonly known as:  LIPITOR Take 40 mg by mouth daily at 6 PM.   docusate sodium 100 MG capsule Commonly known as:  COLACE Take 1 capsule (100 mg total) by mouth 2 (two) times daily as needed for mild constipation.   esomeprazole 40 MG capsule Commonly known as:  NEXIUM Take 40 mg by mouth daily at 12 noon.   furosemide 40 MG tablet Commonly known as:  LASIX Take 40 mg by mouth 2 (two) times daily.   gabapentin 300 MG capsule Commonly known as:  NEURONTIN Take 600 mg by mouth 3 (three) times daily.   MYRBETRIQ 25 MG Tb24 tablet Generic drug:  mirabegron ER TAKE 1 TABLET (25 MG TOTAL) BY MOUTH DAILY.   NON FORMULARY Place 1 Units into the nose at bedtime. CPAP Time of use 2100-0600   patiromer 8.4 g packet Commonly known as:  VELTASSA Take 1 packet (8.4 g total) by mouth daily.   PROAIR RESPICLICK 161 (90 Base) MCG/ACT Aepb Generic drug:  Albuterol Sulfate Inhale 2 puffs into the lungs 4 (four) times daily as needed  (wheezing).   sodium bicarbonate 650 MG tablet Take 650 mg by mouth 2 (two) times daily.   traMADol 50 MG tablet Commonly known as:  ULTRAM Take 1 tablet (50 mg total) by mouth every 8 (eight) hours as needed.       Allergies:  Allergies  Allergen Reactions  . Percocet [Oxycodone-Acetaminophen] Hives    Family History: Family History  Problem Relation Age of Onset  . Diabetes Mother   . Cancer Father   . Breast cancer Neg Hx   . Kidney disease Neg Hx     Social History:  reports that she quit smoking about 13 years ago. Her smoking use included Cigarettes. She has never used smokeless tobacco. She reports that she does not drink alcohol or use drugs.  ROS:  12 point review of systems form, negative other than as per history of present illness. UROLOGY Frequent Urination?: No Hard to postpone urination?: No Burning/pain with urination?: No Get up at  night to urinate?: Yes Leakage of urine?: Yes Urine stream starts and stops?: Yes Trouble starting stream?: Yes Do you have to strain to urinate?: No Blood in urine?: No Urinary tract infection?: Yes Sexually transmitted disease?: No Injury to kidneys or bladder?: No Painful intercourse?: No Weak stream?: No Currently pregnant?: No Vaginal bleeding?: No Last menstrual period?: n    Physical Exam: BP 134/76   Pulse (!) 109   Ht 5' 8"  (1.727 m)   Wt 153 lb (69.4 kg)   BMI 23.26 kg/m   Constitutional:  Alert and oriented, No acute distress.  In wheelchair accompanied by husband today. HEENT: [place] AT, moist mucus membranes.  Trachea midline, no masses. Cardiovascular: No clubbing, cyanosis, or edema.   Respiratory: Normal respiratory effort, no increased work of breathing.   GI: Abdomen is soft, nontender, nondistended, no abdominal masses GU: No catheter.  No CVA tenderness   Skin: No rashes, bruises or suspicious lesions. Neurologic: Grossly intact, no focal deficits, moving all 4 extremities. LE weakness  appreciated.   Psychiatric: Normal mood and affect.  Laboratory Data: Lab Results  Component Value Date   WBC 9.9 06/08/2016   HGB 8.9 (L) 06/08/2016   HCT 27.3 (L) 06/08/2016   MCV 87.0 06/08/2016   PLT 226 06/08/2016    Lab Results  Component Value Date   CREATININE 4.61 (H) 06/08/2016     Lab Results  Component Value Date   HGBA1C 6.4 (H) 05/12/2016     Pertinent Imaging: No recent imaging  Assessment & Plan:    1. 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  S/p  Chemotherapy/ rads (gemcitabine)   Not candidate for cystectomy given overall poor functional status  Given minutes been quite some time since cross-sectional imaging and dramatic weight loss, will obtain CT chest abdomen and pelvis without contrast and have her return for cystoscopy in the office. I am concerned for interval development of metastatic disease.  She and her husband understand this. They're agreeable with the plan.   2. Hydronephrosis, left Secondary to obstructing mass s/p TUR Bard Optima left double-J stent remains in place -->last changed 05/12/16 at which time no gross tumor identified, retrograde faily unremarkable.  Stent replaced at time due to acute on chronic renal failure. If no evidence of disease on cystoscopy and repeat imaging, may consider stent removal  3. Chronic kidney disease, stage IV (severe) (HCC) Left indwelling ureteral stent, as above Recent episode of acute on chronic renal failure- unlikely obstructive component   Return in about 6 weeks (around 08/12/2016) for cystocopy, review imaging.  Hollice Espy, MD  [facility] [address] [place], [place] [address] [phone]

## 2016-07-06 LAB — CULTURE, URINE COMPREHENSIVE

## 2016-07-10 ENCOUNTER — Ambulatory Visit
Admission: RE | Admit: 2016-07-10 | Discharge: 2016-07-10 | Disposition: A | Discharge status: Home / Self Care | Payer: Medicare Other | Source: Ambulatory Visit | Attending: Urology | Admitting: Urology

## 2016-07-10 DIAGNOSIS — I700 Atherosclerosis of aorta: Secondary | ICD-10-CM | POA: Diagnosis not present

## 2016-07-10 DIAGNOSIS — C679 Malignant neoplasm of bladder, unspecified: Secondary | ICD-10-CM | POA: Diagnosis not present

## 2016-07-10 DIAGNOSIS — R932 Abnormal findings on diagnostic imaging of liver and biliary tract: Secondary | ICD-10-CM | POA: Insufficient documentation

## 2016-07-10 DIAGNOSIS — R911 Solitary pulmonary nodule: Secondary | ICD-10-CM | POA: Insufficient documentation

## 2016-07-17 ENCOUNTER — Inpatient Hospital Stay: Payer: Medicare Other | Attending: Oncology | Admitting: Oncology

## 2016-07-17 ENCOUNTER — Other Ambulatory Visit: Payer: Self-pay | Admitting: *Deleted

## 2016-07-17 ENCOUNTER — Inpatient Hospital Stay: Payer: Medicare Other

## 2016-07-17 ENCOUNTER — Telehealth: Payer: Self-pay | Admitting: *Deleted

## 2016-07-17 VITALS — BP 118/71 | HR 87 | Temp 96.9°F | Resp 18 | Wt 164.0 lb

## 2016-07-17 DIAGNOSIS — N179 Acute kidney failure, unspecified: Secondary | ICD-10-CM

## 2016-07-17 DIAGNOSIS — I700 Atherosclerosis of aorta: Secondary | ICD-10-CM | POA: Diagnosis not present

## 2016-07-17 DIAGNOSIS — Z87891 Personal history of nicotine dependence: Secondary | ICD-10-CM | POA: Diagnosis not present

## 2016-07-17 DIAGNOSIS — I129 Hypertensive chronic kidney disease with stage 1 through stage 4 chronic kidney disease, or unspecified chronic kidney disease: Secondary | ICD-10-CM

## 2016-07-17 DIAGNOSIS — E1122 Type 2 diabetes mellitus with diabetic chronic kidney disease: Secondary | ICD-10-CM | POA: Diagnosis not present

## 2016-07-17 DIAGNOSIS — R3911 Hesitancy of micturition: Secondary | ICD-10-CM | POA: Diagnosis not present

## 2016-07-17 DIAGNOSIS — J449 Chronic obstructive pulmonary disease, unspecified: Secondary | ICD-10-CM | POA: Insufficient documentation

## 2016-07-17 DIAGNOSIS — C670 Malignant neoplasm of trigone of bladder: Secondary | ICD-10-CM

## 2016-07-17 DIAGNOSIS — G4730 Sleep apnea, unspecified: Secondary | ICD-10-CM | POA: Diagnosis not present

## 2016-07-17 DIAGNOSIS — K219 Gastro-esophageal reflux disease without esophagitis: Secondary | ICD-10-CM | POA: Insufficient documentation

## 2016-07-17 DIAGNOSIS — N3000 Acute cystitis without hematuria: Secondary | ICD-10-CM | POA: Diagnosis not present

## 2016-07-17 DIAGNOSIS — Z79899 Other long term (current) drug therapy: Secondary | ICD-10-CM | POA: Diagnosis not present

## 2016-07-17 DIAGNOSIS — D631 Anemia in chronic kidney disease: Secondary | ICD-10-CM

## 2016-07-17 LAB — CBC WITH DIFFERENTIAL/PLATELET
Basophils Absolute: 0.1 10*3/uL (ref 0–0.1)
Basophils Relative: 1 %
EOS ABS: 0.1 10*3/uL (ref 0–0.7)
EOS PCT: 2 %
HCT: 26.2 % — ABNORMAL LOW (ref 35.0–47.0)
HEMOGLOBIN: 8.5 g/dL — AB (ref 12.0–16.0)
LYMPHS ABS: 1.1 10*3/uL (ref 1.0–3.6)
Lymphocytes Relative: 14 %
MCH: 29.8 pg (ref 26.0–34.0)
MCHC: 32.5 g/dL (ref 32.0–36.0)
MCV: 91.7 fL (ref 80.0–100.0)
MONOS PCT: 7 %
Monocytes Absolute: 0.5 10*3/uL (ref 0.2–0.9)
Neutro Abs: 5.8 10*3/uL (ref 1.4–6.5)
Neutrophils Relative %: 76 %
Platelets: 288 10*3/uL (ref 150–440)
RBC: 2.85 MIL/uL — ABNORMAL LOW (ref 3.80–5.20)
RDW: 18.1 % — ABNORMAL HIGH (ref 11.5–14.5)
WBC: 7.7 10*3/uL (ref 3.6–11.0)

## 2016-07-17 LAB — BASIC METABOLIC PANEL
Anion gap: 7 (ref 5–15)
BUN: 69 mg/dL — AB (ref 6–20)
CHLORIDE: 112 mmol/L — AB (ref 101–111)
CO2: 20 mmol/L — ABNORMAL LOW (ref 22–32)
CREATININE: 3.79 mg/dL — AB (ref 0.44–1.00)
Calcium: 8 mg/dL — ABNORMAL LOW (ref 8.9–10.3)
GFR calc Af Amer: 12 mL/min — ABNORMAL LOW (ref >60)
GFR calc non Af Amer: 11 mL/min — ABNORMAL LOW (ref >60)
Glucose, Bld: 95 mg/dL (ref 65–99)
Potassium: 3.8 mmol/L (ref 3.5–5.1)
SODIUM: 139 mmol/L (ref 135–145)

## 2016-07-17 LAB — URINALYSIS, COMPLETE (UACMP) WITH MICROSCOPIC
BILIRUBIN URINE: NEGATIVE
Bacteria, UA: NONE SEEN
GLUCOSE, UA: NEGATIVE mg/dL
Ketones, ur: NEGATIVE mg/dL
Nitrite: NEGATIVE
PH: 6 (ref 5.0–8.0)
Protein, ur: 100 mg/dL — AB
SPECIFIC GRAVITY, URINE: 1.009 (ref 1.005–1.030)

## 2016-07-17 MED ORDER — LEVOFLOXACIN 250 MG PO TABS
250.0000 mg | ORAL_TABLET | ORAL | 0 refills | Status: DC
Start: 2016-07-17 — End: 2016-09-17

## 2016-07-17 NOTE — Telephone Encounter (Signed)
Called pt and let her know that the ua showed that she has a lot of wbc's and we are still waiting on culture to come back and while we are waiting on culture Dr. Janese Banks would like to start her on a low dose levaquin 250 mg and she takes 1 tablet every other day for 3 pills.  If the culture shows something different and we need to make changes with the atb then we may call back. She just got a call to say that her atb ready. She will pick it up and she understands to take 1 pill every other day and I did say that it was a low dose because of kidney function is not good. She verbalizes understanding.

## 2016-07-17 NOTE — Progress Notes (Signed)
Patient is here for follow up, she is doing ok, no major complaints other than her groin area is painful.

## 2016-07-17 NOTE — Progress Notes (Addendum)
Hematology/Oncology Consult note [facility]  Telephone:([phone] [phone]  Patient Care Team: Madelyn Brunner, MD as PCP - General (Internal Medicine)   Name of the patient: Barbara [name]  [id number]  [birth year]   Date of visit: 07/17/16  Diagnosis- muscle invasive bladder cancer  Chief complaint/ Reason for visit- routine follow-up of bladder cancer  Heme/Onc history: #AUG 2017 TURBT- [Dr.Brandon] Large nodular proximally 5 cm bladder mass involving the left hemitrigone extending up the left lateral bladder wall, 2 discrete nodules with bridge of abnormal tissue; Tumor clinically T2. TURBT- Tumor invades muscularis propria [detrusor muscle]. NOT candidate for cystectomy.  # SEP 2017- RT- GEM weekly.  # Acute Renal failure/CKD IV / C.diff [aug 2017]  She completed palliative weekly gemcitabine along with [facility] and radiation. She has had multiple hospital admissions for hyperglycemia as well as acute on chronic renal failure. Also has had 20 pound unintentional weight loss over several months. Patient went to the operating room on 06/12/2016 for ureteral stent exchange. At that point she was not noted to have any obvious gross residual tumor. Patient was seen by Dr. Erlene Quan from urology on 07/01/2016. She was concerned about her ongoing weight loss and possibility of metastatic disease. CT chest abdomen and pelvis were ordered. CT chest showed a 6 mm posterior left lower lobe nodule. No prior imaging of the lower chest to compare and close follow-up was recommended. Subtle nodularity of the liver raises the question of cirrhosis. Mild fullness of intrarenal collecting system bilaterally with left double-J internal ureteral stent visualized in situ. Traumatic circumferential bladder wall thickening is evident. She is considering a repeat cystoscopy next month.   Interval history- patient's last hospitalization was about a month ago and she is  slowly recovering. She states that her appetite is improving and she has gained some weight. He currently reports some urinary hesitancy and burning. Denies any fevers  ECOG PS- 2-3 Pain scale- no Opioid associated constipation- no  Review of systems- Review of Systems  Constitutional: Positive for malaise/fatigue. Negative for chills, fever and weight loss.  HENT: Negative for congestion, ear discharge and nosebleeds.   Eyes: Negative for blurred vision.  Respiratory: Negative for cough, hemoptysis, sputum production, shortness of breath and wheezing.   Cardiovascular: Negative for chest pain, palpitations, orthopnea and claudication.  Gastrointestinal: Negative for abdominal pain, blood in stool, constipation, diarrhea, heartburn, melena, nausea and vomiting.  Genitourinary: Positive for dysuria and urgency. Negative for flank pain, frequency and hematuria.  Musculoskeletal: Negative for back pain, joint pain and myalgias.  Skin: Negative for rash.  Neurological: Negative for dizziness, tingling, focal weakness, seizures, weakness and headaches.  Endo/Heme/Allergies: Does not bruise/bleed easily.  Psychiatric/Behavioral: Negative for depression and suicidal ideas. The patient does not have insomnia.      Current treatment- observation  Allergies  Allergen Reactions  . Percocet [Oxycodone-Acetaminophen] Hives     Past Medical History:  Diagnosis Date  . Anemia associated with chronic renal failure 2017   blood transfusion last week 10/17  . Arthritis   . Cancer ([place]) 2017   bladder  . Chronic kidney disease   . 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  dr. Candiss Norse and dr. Holley Raring follow her  . COPD (chronic obstructive pulmonary disease) ([place])   . Diabetes mellitus without complication ([place])   . Dyspnea    with exertion  . GERD (gastroesophageal reflux disease)   . Hypertension   . Sleep  apnea    uses cpap  . Urinary obstruction 01/2016     Past  Surgical History:  Procedure Laterality Date  . ABDOMINAL HYSTERECTOMY    . CYSTOSCOPY W/ RETROGRADES Bilateral 02/17/2016   Procedure: CYSTOSCOPY WITH RETROGRADE PYELOGRAM;  Surgeon: Hollice Espy, MD;  Location: [facility];  Service: Urology;  Laterality: Bilateral;  . CYSTOSCOPY W/ URETERAL STENT PLACEMENT Left 05/12/2016   Procedure: CYSTOSCOPY WITH STENT REPLACEMENT;  Surgeon: Hollice Espy, MD;  Location: [facility];  Service: Urology;  Laterality: Left;  . CYSTOSCOPY WITH STENT PLACEMENT Left 01/21/2016   Procedure: CYSTOSCOPY WITH double J STENT PLACEMENT;  Surgeon: Franchot Gallo, MD;  Location: [facility];  Service: Urology;  Laterality: Left;  . KIDNEY SURGERY  01/21/2016   IR NEPHROSTOMY PLACEMENT LEFT   . PERIPHERAL VASCULAR CATHETERIZATION N/A 04/07/2016   Procedure: Glori Luis Cath Insertion;  Surgeon: Katha Cabal, MD;  Location: [facility];  Service: Cardiovascular;  Laterality: N/A;  . ROTATOR CUFF REPAIR     both sides  . TRANSURETHRAL RESECTION OF BLADDER TUMOR N/A 02/17/2016   Procedure: TRANSURETHRAL RESECTION OF BLADDER TUMOR (TURBT)-LARGE;  Surgeon: Hollice Espy, MD;  Location: [facility];  Service: Urology;  Laterality: N/A;  . URETEROSCOPY Left 02/17/2016   Procedure: URETEROSCOPY;  Surgeon: Hollice Espy, MD;  Location: [facility];  Service: Urology;  Laterality: Left;    Social History   Social History  . Marital status: Married    Spouse name: N/A  . Number of children: N/A  . Years of education: N/A   Occupational History  . retired    Social History Main Topics  . Smoking status: Former Smoker    Types: Cigarettes    Quit date: 07/20/2002  . Smokeless tobacco: Never Used     Comment: 01/22/2016   "  quit smoking many years ago "  . Alcohol use No  . Drug use: No  . Sexual activity: No   Other Topics Concern  . Not on file   Social History Narrative  . No narrative on file    Family History  Problem Relation Age of Onset  . Diabetes  Mother   . Cancer Father   . Breast cancer Neg Hx   . Kidney disease Neg Hx      Current Outpatient Prescriptions:  .  acetaminophen (TYLENOL) 325 MG tablet, Take 2 tablets (650 mg total) by mouth every 6 (six) hours as needed for mild pain., Disp: , Rfl:  .  Albuterol Sulfate (PROAIR RESPICLICK) 267 (90 Base) MCG/ACT AEPB, Inhale 2 puffs into the lungs 4 (four) times daily as needed (wheezing). , Disp: , Rfl:  .  atorvastatin (LIPITOR) 40 MG tablet, Take 40 mg by mouth daily at 6 PM. , Disp: , Rfl:  .  docusate sodium (COLACE) 100 MG capsule, Take 1 capsule (100 mg total) by mouth 2 (two) times daily as needed for mild constipation., Disp: 10 capsule, Rfl: 0 .  esomeprazole (NEXIUM) 40 MG capsule, Take 40 mg by mouth daily at 12 noon., Disp: , Rfl:  .  furosemide (LASIX) 40 MG tablet, Take 40 mg by mouth 2 (two) times daily., Disp: , Rfl:  .  gabapentin (NEURONTIN) 300 MG capsule, Take 600 mg by mouth 3 (three) times daily., Disp: , Rfl:  .  MYRBETRIQ 25 MG TB24 tablet, TAKE 1 TABLET (25 MG TOTAL) BY MOUTH DAILY., Disp: 30 tablet, Rfl: 5 .  NON FORMULARY, Place 1 Units into the nose at bedtime. CPAP  Time of use 2100-0600, Disp: , Rfl:  .  patiromer (VELTASSA) 8.4 g packet, Take 1 packet (8.4 g total) by mouth daily., Disp: 30 packet, Rfl: 1 .  sodium bicarbonate 650 MG tablet, Take 650 mg by mouth 2 (two) times daily., Disp: , Rfl:  .  traMADol (ULTRAM) 50 MG tablet, Take 1 tablet (50 mg total) by mouth every 8 (eight) hours as needed., Disp: 30 tablet, Rfl: 0  Physical exam:  Vitals:   07/17/16 1013  BP: 118/71  Pulse: 87  Resp: 18  Temp: (!) 96.9 F (36.1 C)  TempSrc: Tympanic  Weight: 164 lb 0.4 oz (74.4 kg)   Physical Exam  Constitutional: She is oriented to person, place, and time.  Elderly frail appearing. Does not appear to be in any acute distress  HENT:  Head: Normocephalic and atraumatic.  Eyes: EOM are normal. Pupils are equal, round, and reactive to light.  Neck:  Normal range of motion.  Cardiovascular: Normal rate, regular rhythm and normal heart sounds.   Pulmonary/Chest: Effort normal and breath sounds normal.  Abdominal: Soft. Bowel sounds are normal.  Neurological: She is alert and oriented to person, place, and time.  Skin: Skin is warm and dry.     CMP Latest Ref Rng & Units 06/08/2016  Glucose 65 - 99 mg/dL 104(H)  BUN 6 - 20 mg/dL 56(H)  Creatinine 0.44 - 1.00 mg/dL 4.61(H)  Sodium 135 - 145 mmol/L 131(L)  Potassium 3.5 - 5.1 mmol/L 4.6  Chloride 101 - 111 mmol/L 105  CO2 22 - 32 mmol/L 18(L)  Calcium 8.9 - 10.3 mg/dL 8.7(L)  Total Protein 6.5 - 8.1 g/dL -  Total Bilirubin 0.3 - 1.2 mg/dL -  Alkaline Phos 38 - 126 U/L -  AST 15 - 41 U/L -  ALT 14 - 54 U/L -   CBC Latest Ref Rng & Units 06/08/2016  WBC 3.6 - 11.0 K/uL 9.9  Hemoglobin 12.0 - 16.0 g/dL 8.9(L)  Hematocrit 35.0 - 47.0 % 27.3(L)  Platelets 150 - 440 K/uL 226    No images are attached to the encounter.  Ct Abdomen Pelvis Wo Contrast  Result Date: 07/10/2016 CLINICAL DATA:  Bladder cancer. EXAM: CT CHEST, ABDOMEN AND PELVIS WITHOUT CONTRAST TECHNIQUE: Multidetector CT imaging of the chest, abdomen and pelvis was performed following the standard protocol without IV contrast. COMPARISON:  01/20/2016 FINDINGS: CT CHEST FINDINGS Cardiovascular: The heart size is normal. No pericardial effusion. Coronary artery calcification is noted. Atherosclerotic calcification is noted in the wall of the thoracic aorta. Right Port-A-Cath tip positioned in the mid to distal SVC. Mediastinum/Nodes: No mediastinal lymphadenopathy. No evidence for gross hilar lymphadenopathy although assessment is limited by the lack of intravenous contrast on today's study. There is no axillary lymphadenopathy. Lungs/Pleura: 6 mm posterior left lower lobe pulmonary nodule seen image 92 series 4. No other pulmonary nodule or mass is evident. No focal airspace consolidation. No pulmonary edema or pleural  effusion. Musculoskeletal: Bone windows reveal no worrisome lytic or sclerotic osseous lesions. CT ABDOMEN PELVIS FINDINGS Hepatobiliary: No focal abnormality in the liver on this study without intravenous contrast. Liver contour appears subtly nodular. There is no evidence for gallstones, gallbladder wall thickening, or pericholecystic fluid. No intrahepatic or extrahepatic biliary dilation. Pancreas: No focal mass lesion. No dilatation of the main duct. No intraparenchymal cyst. No peripancreatic edema. Spleen: No splenomegaly. No focal mass lesion. Adrenals/Urinary Tract: No adrenal nodule or mass. Mild right hydronephrosis, slightly progressive in the interval. Mild distention right  ureter noted. Left double-J internal ureteral stent visualized in situ with mild fullness left intrarenal collecting system, similar to prior study. Large exophytic left renal cyst seen previously has involuted in the interval. Dramatic circumferential bladder wall thickening is evident. Stomach/Bowel: Stomach is nondistended. No gastric wall thickening. No evidence of outlet obstruction. Large duodenal diverticulum noted. No small bowel wall thickening. No small bowel dilatation. No gross colonic mass. No colonic wall thickening. No substantial diverticular change. Vascular/Lymphatic: There is abdominal aortic atherosclerosis without aneurysm. There is no gastrohepatic or hepatoduodenal ligament lymphadenopathy. No intraperitoneal or retroperitoneal lymphadenopathy. No pelvic sidewall lymphadenopathy. Reproductive: Uterus surgically absent.  There is no adnexal mass. Other: No intraperitoneal free fluid. Musculoskeletal: Degenerative changes noted in the hips bilaterally, left greater than right. Bone windows reveal no worrisome lytic or sclerotic osseous lesions. IMPRESSION: 1. Mild fullness of intrarenal collecting system bilaterally with left double-J internal ureteral stent visualized in situ. Traumatic circumferential bladder  wall thickening is evident. 2. 6 mm posterior left lower lobe pulmonary nodule. No prior imaging of the lower chest with which to compare. As metastatic lesion could have this appearance, close attention on follow-up recommended. 3.  Abdominal Aortic Atherosclerois (ICD10-170.0) 4. Subtle nodularity of the liver contour raises the question of cirrhosis, but is not diagnostic of such. Electronically Signed   By: Misty Stanley M.D.   On: 07/10/2016 11:08   Ct Chest Wo Contrast  Result Date: 07/10/2016 CLINICAL DATA:  Bladder cancer. EXAM: CT CHEST, ABDOMEN AND PELVIS WITHOUT CONTRAST TECHNIQUE: Multidetector CT imaging of the chest, abdomen and pelvis was performed following the standard protocol without IV contrast. COMPARISON:  01/20/2016 FINDINGS: CT CHEST FINDINGS Cardiovascular: The heart size is normal. No pericardial effusion. Coronary artery calcification is noted. Atherosclerotic calcification is noted in the wall of the thoracic aorta. Right Port-A-Cath tip positioned in the mid to distal SVC. Mediastinum/Nodes: No mediastinal lymphadenopathy. No evidence for gross hilar lymphadenopathy although assessment is limited by the lack of intravenous contrast on today's study. There is no axillary lymphadenopathy. Lungs/Pleura: 6 mm posterior left lower lobe pulmonary nodule seen image 92 series 4. No other pulmonary nodule or mass is evident. No focal airspace consolidation. No pulmonary edema or pleural effusion. Musculoskeletal: Bone windows reveal no worrisome lytic or sclerotic osseous lesions. CT ABDOMEN PELVIS FINDINGS Hepatobiliary: No focal abnormality in the liver on this study without intravenous contrast. Liver contour appears subtly nodular. There is no evidence for gallstones, gallbladder wall thickening, or pericholecystic fluid. No intrahepatic or extrahepatic biliary dilation. Pancreas: No focal mass lesion. No dilatation of the main duct. No intraparenchymal cyst. No peripancreatic edema.  Spleen: No splenomegaly. No focal mass lesion. Adrenals/Urinary Tract: No adrenal nodule or mass. Mild right hydronephrosis, slightly progressive in the interval. Mild distention right ureter noted. Left double-J internal ureteral stent visualized in situ with mild fullness left intrarenal collecting system, similar to prior study. Large exophytic left renal cyst seen previously has involuted in the interval. Dramatic circumferential bladder wall thickening is evident. Stomach/Bowel: Stomach is nondistended. No gastric wall thickening. No evidence of outlet obstruction. Large duodenal diverticulum noted. No small bowel wall thickening. No small bowel dilatation. No gross colonic mass. No colonic wall thickening. No substantial diverticular change. Vascular/Lymphatic: There is abdominal aortic atherosclerosis without aneurysm. There is no gastrohepatic or hepatoduodenal ligament lymphadenopathy. No intraperitoneal or retroperitoneal lymphadenopathy. No pelvic sidewall lymphadenopathy. Reproductive: Uterus surgically absent.  There is no adnexal mass. Other: No intraperitoneal free fluid. Musculoskeletal: Degenerative changes noted in the hips bilaterally,  left greater than right. Bone windows reveal no worrisome lytic or sclerotic osseous lesions. IMPRESSION: 1. Mild fullness of intrarenal collecting system bilaterally with left double-J internal ureteral stent visualized in situ. Traumatic circumferential bladder wall thickening is evident. 2. 6 mm posterior left lower lobe pulmonary nodule. No prior imaging of the lower chest with which to compare. As metastatic lesion could have this appearance, close attention on follow-up recommended. 3.  Abdominal Aortic Atherosclerois (ICD10-170.0) 4. Subtle nodularity of the liver contour raises the question of cirrhosis, but is not diagnostic of such. Electronically Signed   By: Misty Stanley M.D.   On: 07/10/2016 11:08     Assessment and plan- Patient is a 75 y.o.  female with a history of muscle invasive bladder cancer status post Concurrent chemoradiation with weekly gemcitabine ending on 04/23/2016 who is here for a routine follow-up  1. I reviewed the results of the CT chest abdomen and pelvis with the patient which does not reveal any evidence of metastatic disease. There is a 6 mm pulmonary nodule noted in the left lower lobe which would need continued follow-up. There was persistent circumferential bladder wall thickening for which patient will be getting a repeat cystoscopy next month. She will see Dr. Rogue Bussing in about 2 months time for routine history and physical with CBC and CMP  2. Possible urinary tract infection- given her symptoms of urinary frequency and hesitancy I will go ahead and order urinalysis with urine culture and prescribe antibiotics if positive    Total face to face encounter time for this patient visit was 30 min. >50% of the time was  spent in counseling and coordination of care. I reviewed the results of the CT chest abdomen and pelvis with the patient and discussed further follow-up    Visit Diagnosis 1. Cancer of trigone of urinary bladder ([employer])   2. Acute cystitis without hematuria      Dr. Randa Evens, MD, MPH [facility] Pager- [phone] 07/17/2016 11:17 AM    Addendum: Urinalysis is positive for urinary tract infections. Multiple urinalyses have been positive in the past but most of the urine cultures have been negative. Last positive urine culture was then March 2017 when she grew Escherichia coli. Given her complicated urology history I will go ahead and prescribe Levaquin 250 mg every 48 hours 3 doses given her impaired renal function

## 2016-07-18 LAB — URINE CULTURE

## 2016-08-10 ENCOUNTER — Ambulatory Visit (INDEPENDENT_AMBULATORY_CARE_PROVIDER_SITE_OTHER): Payer: Medicare Other

## 2016-08-10 ENCOUNTER — Telehealth: Payer: Self-pay

## 2016-08-10 VITALS — BP 113/66 | HR 85 | Ht 68.0 in | Wt 161.8 lb

## 2016-08-10 DIAGNOSIS — R310 Gross hematuria: Secondary | ICD-10-CM | POA: Diagnosis not present

## 2016-08-10 LAB — URINALYSIS, COMPLETE
Bilirubin, UA: NEGATIVE
GLUCOSE, UA: NEGATIVE
Ketones, UA: NEGATIVE
Nitrite, UA: NEGATIVE
Specific Gravity, UA: 1.015 (ref 1.005–1.030)
Urobilinogen, Ur: 0.2 mg/dL (ref 0.2–1.0)
pH, UA: 6.5 (ref 5.0–7.5)

## 2016-08-10 LAB — MICROSCOPIC EXAMINATION
Bacteria, UA: NONE SEEN
Epithelial Cells (non renal): NONE SEEN /hpf (ref 0–10)
WBC, UA: >30 /hpf — AB (ref 0–?)

## 2016-08-10 NOTE — Telephone Encounter (Signed)
A u/a was completed. Lab Lattie Haw stated that urine was very gross and thick. Not enough for an ucx. Please advise.

## 2016-08-10 NOTE — Progress Notes (Signed)
Pt presents today with c/o urinary frequency, hard to postpone urination, dysuria, leakage of urine, blood in urine, and lower abd pain. A clean catch specimen was obtained for u/a.   Blood pressure 113/66, pulse 85, height 5' 8"  (1.727 m), weight 161 lb 12.8 oz (73.4 kg).

## 2016-08-10 NOTE — Telephone Encounter (Signed)
Needs to repeat with straight cath UA enough for culture.  Given indwelling stent, NEEDS UCx unless sick with fevers, AMS, etc.  All current symptoms could be related to stent itself.    Hollice Espy, MD

## 2016-08-11 ENCOUNTER — Ambulatory Visit (INDEPENDENT_AMBULATORY_CARE_PROVIDER_SITE_OTHER): Payer: Medicare Other

## 2016-08-11 DIAGNOSIS — R310 Gross hematuria: Secondary | ICD-10-CM | POA: Diagnosis not present

## 2016-08-11 NOTE — Telephone Encounter (Signed)
Pt will RTC today for a CATH specimen for ucx.

## 2016-08-11 NOTE — Progress Notes (Signed)
In and Out Catheterization  Patient is present today for a I & O catheterization due to possible UTI. Patient was cleaned and prepped in a sterile fashion with betadine and Lidocaine 2% jelly was instilled into the urethra.  A 14FR cath was inserted no complications were noted , 74m of urine return was noted, urine was pink in color. A clean urine sample was collected for cx. Bladder was drained  And catheter was removed with out difficulty.    Preformed by: CToniann Fail LPN

## 2016-08-13 ENCOUNTER — Other Ambulatory Visit: Payer: Self-pay | Admitting: Internal Medicine

## 2016-08-13 ENCOUNTER — Inpatient Hospital Stay: Payer: Medicare Other | Attending: Internal Medicine

## 2016-08-13 DIAGNOSIS — D631 Anemia in chronic kidney disease: Secondary | ICD-10-CM | POA: Diagnosis not present

## 2016-08-13 DIAGNOSIS — N3000 Acute cystitis without hematuria: Secondary | ICD-10-CM | POA: Insufficient documentation

## 2016-08-13 DIAGNOSIS — Z87891 Personal history of nicotine dependence: Secondary | ICD-10-CM | POA: Diagnosis not present

## 2016-08-13 DIAGNOSIS — G4730 Sleep apnea, unspecified: Secondary | ICD-10-CM | POA: Diagnosis not present

## 2016-08-13 DIAGNOSIS — Z452 Encounter for adjustment and management of vascular access device: Secondary | ICD-10-CM | POA: Diagnosis not present

## 2016-08-13 DIAGNOSIS — J449 Chronic obstructive pulmonary disease, unspecified: Secondary | ICD-10-CM | POA: Insufficient documentation

## 2016-08-13 DIAGNOSIS — K219 Gastro-esophageal reflux disease without esophagitis: Secondary | ICD-10-CM | POA: Diagnosis not present

## 2016-08-13 DIAGNOSIS — R3911 Hesitancy of micturition: Secondary | ICD-10-CM | POA: Insufficient documentation

## 2016-08-13 DIAGNOSIS — I129 Hypertensive chronic kidney disease with stage 1 through stage 4 chronic kidney disease, or unspecified chronic kidney disease: Secondary | ICD-10-CM | POA: Insufficient documentation

## 2016-08-13 DIAGNOSIS — I700 Atherosclerosis of aorta: Secondary | ICD-10-CM | POA: Diagnosis not present

## 2016-08-13 DIAGNOSIS — N179 Acute kidney failure, unspecified: Secondary | ICD-10-CM | POA: Insufficient documentation

## 2016-08-13 DIAGNOSIS — C670 Malignant neoplasm of trigone of bladder: Secondary | ICD-10-CM | POA: Insufficient documentation

## 2016-08-13 DIAGNOSIS — E1122 Type 2 diabetes mellitus with diabetic chronic kidney disease: Secondary | ICD-10-CM | POA: Insufficient documentation

## 2016-08-13 DIAGNOSIS — Z79899 Other long term (current) drug therapy: Secondary | ICD-10-CM | POA: Diagnosis not present

## 2016-08-13 DIAGNOSIS — Z95828 Presence of other vascular implants and grafts: Secondary | ICD-10-CM

## 2016-08-13 DIAGNOSIS — Z1231 Encounter for screening mammogram for malignant neoplasm of breast: Secondary | ICD-10-CM

## 2016-08-13 MED ORDER — SODIUM CHLORIDE 0.9% FLUSH
10.0000 mL | INTRAVENOUS | Status: DC | PRN
  Administered 2016-08-13: 10 mL via INTRAVENOUS
  Filled 2016-08-13: qty 10

## 2016-08-13 MED ORDER — HEPARIN SOD (PORK) LOCK FLUSH 100 UNIT/ML IV SOLN
500.0000 [IU] | Freq: Once | INTRAVENOUS | Status: AC
  Administered 2016-08-13: 500 [IU] via INTRAVENOUS

## 2016-08-18 LAB — CULTURE, URINE COMPREHENSIVE

## 2016-08-26 ENCOUNTER — Other Ambulatory Visit: Payer: Medicare Other | Admitting: Urology

## 2016-08-27 ENCOUNTER — Encounter: Payer: Self-pay | Admitting: Urology

## 2016-08-27 ENCOUNTER — Ambulatory Visit (INDEPENDENT_AMBULATORY_CARE_PROVIDER_SITE_OTHER): Payer: Medicare Other | Admitting: Urology

## 2016-08-27 VITALS — BP 129/62 | HR 84 | Ht 68.0 in | Wt 161.9 lb

## 2016-08-27 DIAGNOSIS — C679 Malignant neoplasm of bladder, unspecified: Secondary | ICD-10-CM

## 2016-08-27 DIAGNOSIS — N1330 Unspecified hydronephrosis: Secondary | ICD-10-CM

## 2016-08-27 LAB — URINALYSIS, COMPLETE
BILIRUBIN UA: NEGATIVE
Glucose, UA: NEGATIVE
KETONES UA: NEGATIVE
Nitrite, UA: NEGATIVE
PH UA: 6.5 (ref 5.0–7.5)
SPEC GRAV UA: 1.015 (ref 1.005–1.030)
UUROB: 0.2 mg/dL (ref 0.2–1.0)

## 2016-08-27 LAB — MICROSCOPIC EXAMINATION
Bacteria, UA: NONE SEEN
EPITHELIAL CELLS (NON RENAL): NONE SEEN /HPF (ref 0–10)

## 2016-08-27 MED ORDER — LIDOCAINE HCL 2 % EX GEL
1.0000 "application " | Freq: Once | CUTANEOUS | Status: AC
  Administered 2016-08-27: 1 via URETHRAL

## 2016-08-27 MED ORDER — CIPROFLOXACIN HCL 500 MG PO TABS
500.0000 mg | ORAL_TABLET | Freq: Once | ORAL | Status: AC
  Administered 2016-08-27: 500 mg via ORAL

## 2016-08-27 NOTE — Progress Notes (Signed)
   08/27/16  CC:  Chief Complaint  Patient presents with  . Cysto    HPI: 76 year old female with history of muscle invasive bladder cancer diagnosed in 01/2015 status post TURBT followed by palliative gemcitabine and concurrent radiation.  She currently has an indwelling left ureteral stent, [employer] Optima which is placed in 04/2016.  Overall, since last visit, she seems to be improving. She underwent restaging CT chest abdomen pelvis which showed pulmonary nodule, 6 mm being followed.  Otherwise, no evidence of obvious metastatic disease on 06/2016.  Today, she reports having urinary frequency, urgency, incontinence which is unchanged from her baseline. Previous urine cultures have been negative and is likely related to her stent.   Blood pressure 129/62, pulse 84, height 5' 8"  (1.727 m), weight 161 lb 14.4 oz (73.4 kg). NED. A&Ox3.   No respiratory distress   Abd soft, NT, ND Normal external genitalia with patent urethral meatus  Cystoscopy Procedure Note  Patient identification was confirmed, informed consent was obtained, and patient was prepped using Betadine solution.  Lidocaine jelly was administered per urethral meatus.    Preoperative abx where received prior to procedure.    Procedure: - Flexible cystoscope introduced, without any difficulty.   - Thorough search of the bladder revealed:    normal urethral meatus    normal urothelium with moderate debris although cystoscopy somewhat limited due to patient's tolerance    left distal aspect of ureteral stent seen, clearly visualize with minimal encrustation   - Ureteral orifices were normal in position and appearance.  Post-Procedure: - Cystoscopy not particularly well tolerated today at bedside  Assessment/ Plan:  1. Malignant neoplasm of urinary bladder, unspecified site [facility]) Cystoscopy today somewhat limited due to debris as well as patient's ability to tolerate the procedure No obvious tumor in bladder  today Ms. Bracher will be due for stent exchange in late March  I have discussed with her today about proceeding to the operating room for more formal cystoscopy under anesthesia at that time, TURBT as needed, and left ureteral stent exchange. Risk and benefits were reviewed. She is agreeable with this plan. - Urinalysis, Complete - ciprofloxacin (CIPRO) tablet 500 mg; Take 1 tablet (500 mg total) by mouth once. - lidocaine (XYLOCAINE) 2 % jelly 1 application; Place 1 application into the urethra once. - Microscopic Examination  2. Hydronephrosis, left As above If there is no obvious residual tumor, consider performing a left retrograde and assessing the left ureteral drainage. Fairly symptomatic from left ureteral stent, if kidney drains promptly, consider removing stent   Schedule surgery next month as above   Hollice Espy, MD

## 2016-08-28 ENCOUNTER — Telehealth: Payer: Self-pay

## 2016-08-31 NOTE — Telephone Encounter (Signed)
Open in error

## 2016-09-01 ENCOUNTER — Telehealth: Payer: Self-pay | Admitting: Radiology

## 2016-09-01 NOTE — Telephone Encounter (Signed)
LMOM on cell #. No answer/unable to LM on home number. Need to discuss upcoming surgery.

## 2016-09-07 ENCOUNTER — Other Ambulatory Visit: Payer: Self-pay | Admitting: Radiology

## 2016-09-07 DIAGNOSIS — C679 Malignant neoplasm of bladder, unspecified: Secondary | ICD-10-CM

## 2016-09-07 NOTE — Telephone Encounter (Signed)
Notified pt of surgery scheduled with Dr Erlene Quan on 10/12/16, pre-admit testing appt on 10/02/16 @9 :00 & to call Friday prior to surgery for arrival time to SDS. Pt voices understanding.

## 2016-09-09 ENCOUNTER — Ambulatory Visit
Admission: RE | Admit: 2016-09-09 | Discharge: 2016-09-09 | Disposition: A | Discharge status: Home / Self Care | Payer: Medicare Other | Source: Ambulatory Visit | Attending: Internal Medicine | Admitting: Internal Medicine

## 2016-09-09 DIAGNOSIS — Z1231 Encounter for screening mammogram for malignant neoplasm of breast: Secondary | ICD-10-CM | POA: Insufficient documentation

## 2016-09-09 HISTORY — DX: Personal history of antineoplastic chemotherapy: Z92.21

## 2016-09-17 ENCOUNTER — Inpatient Hospital Stay (HOSPITAL_BASED_OUTPATIENT_CLINIC_OR_DEPARTMENT_OTHER): Payer: Medicare Other | Admitting: Internal Medicine

## 2016-09-17 ENCOUNTER — Inpatient Hospital Stay: Payer: Medicare Other | Attending: Internal Medicine

## 2016-09-17 ENCOUNTER — Inpatient Hospital Stay: Payer: Medicare Other

## 2016-09-17 ENCOUNTER — Other Ambulatory Visit: Payer: Self-pay | Admitting: *Deleted

## 2016-09-17 VITALS — BP 133/75 | HR 73 | Temp 97.9°F | Wt 159.4 lb

## 2016-09-17 DIAGNOSIS — J449 Chronic obstructive pulmonary disease, unspecified: Secondary | ICD-10-CM | POA: Insufficient documentation

## 2016-09-17 DIAGNOSIS — N184 Chronic kidney disease, stage 4 (severe): Secondary | ICD-10-CM | POA: Diagnosis not present

## 2016-09-17 DIAGNOSIS — E1122 Type 2 diabetes mellitus with diabetic chronic kidney disease: Secondary | ICD-10-CM | POA: Insufficient documentation

## 2016-09-17 DIAGNOSIS — C670 Malignant neoplasm of trigone of bladder: Secondary | ICD-10-CM

## 2016-09-17 DIAGNOSIS — D631 Anemia in chronic kidney disease: Secondary | ICD-10-CM

## 2016-09-17 DIAGNOSIS — I129 Hypertensive chronic kidney disease with stage 1 through stage 4 chronic kidney disease, or unspecified chronic kidney disease: Secondary | ICD-10-CM | POA: Insufficient documentation

## 2016-09-17 DIAGNOSIS — Z79899 Other long term (current) drug therapy: Secondary | ICD-10-CM

## 2016-09-17 DIAGNOSIS — K219 Gastro-esophageal reflux disease without esophagitis: Secondary | ICD-10-CM | POA: Insufficient documentation

## 2016-09-17 DIAGNOSIS — Z95828 Presence of other vascular implants and grafts: Secondary | ICD-10-CM

## 2016-09-17 DIAGNOSIS — Z87891 Personal history of nicotine dependence: Secondary | ICD-10-CM | POA: Insufficient documentation

## 2016-09-17 DIAGNOSIS — G4730 Sleep apnea, unspecified: Secondary | ICD-10-CM | POA: Insufficient documentation

## 2016-09-17 LAB — COMPREHENSIVE METABOLIC PANEL
ALBUMIN: 3.2 g/dL — AB (ref 3.5–5.0)
ALT: 10 U/L — ABNORMAL LOW (ref 14–54)
ANION GAP: 6 (ref 5–15)
AST: 14 U/L — ABNORMAL LOW (ref 15–41)
Alkaline Phosphatase: 76 U/L (ref 38–126)
BILIRUBIN TOTAL: 0.2 mg/dL — AB (ref 0.3–1.2)
BUN: 45 mg/dL — ABNORMAL HIGH (ref 6–20)
CO2: 21 mmol/L — ABNORMAL LOW (ref 22–32)
Calcium: 8.6 mg/dL — ABNORMAL LOW (ref 8.9–10.3)
Chloride: 112 mmol/L — ABNORMAL HIGH (ref 101–111)
Creatinine, Ser: 4.07 mg/dL — ABNORMAL HIGH (ref 0.44–1.00)
GFR, EST AFRICAN AMERICAN: 11 mL/min — AB (ref >60)
GFR, EST NON AFRICAN AMERICAN: 10 mL/min — AB (ref >60)
GLUCOSE: 102 mg/dL — AB (ref 65–99)
Potassium: 4.2 mmol/L (ref 3.5–5.1)
Sodium: 139 mmol/L (ref 135–145)
TOTAL PROTEIN: 8.4 g/dL — AB (ref 6.5–8.1)

## 2016-09-17 LAB — CBC WITH DIFFERENTIAL/PLATELET
BASOS PCT: 1 %
Basophils Absolute: 0 10*3/uL (ref 0–0.1)
Eosinophils Absolute: 0.2 10*3/uL (ref 0–0.7)
Eosinophils Relative: 3 %
HEMATOCRIT: 24 % — AB (ref 35.0–47.0)
Hemoglobin: 7.8 g/dL — ABNORMAL LOW (ref 12.0–16.0)
Lymphocytes Relative: 20 %
Lymphs Abs: 1.3 10*3/uL (ref 1.0–3.6)
MCH: 30 pg (ref 26.0–34.0)
MCHC: 32.4 g/dL (ref 32.0–36.0)
MCV: 92.7 fL (ref 80.0–100.0)
MONOS PCT: 9 %
Monocytes Absolute: 0.5 10*3/uL (ref 0.2–0.9)
NEUTROS ABS: 4.1 10*3/uL (ref 1.4–6.5)
Neutrophils Relative %: 67 %
Platelets: 267 10*3/uL (ref 150–440)
RBC: 2.59 MIL/uL — ABNORMAL LOW (ref 3.80–5.20)
RDW: 13.4 % (ref 11.5–14.5)
WBC: 6.2 10*3/uL (ref 3.6–11.0)

## 2016-09-17 LAB — PREPARE RBC (CROSSMATCH)

## 2016-09-17 MED ORDER — SODIUM CHLORIDE 0.9% FLUSH
10.0000 mL | INTRAVENOUS | Status: DC | PRN
  Administered 2016-09-17: 10 mL via INTRAVENOUS
  Filled 2016-09-17: qty 10

## 2016-09-17 MED ORDER — HEPARIN SOD (PORK) LOCK FLUSH 100 UNIT/ML IV SOLN
500.0000 [IU] | Freq: Once | INTRAVENOUS | Status: AC
  Administered 2016-09-17: 500 [IU] via INTRAVENOUS

## 2016-09-17 NOTE — Progress Notes (Signed)
Patient here today for follow up.   Patient has upcoming surgery

## 2016-09-17 NOTE — Assessment & Plan Note (Addendum)
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  s/p RT- and weekly gem x2 [poor tolerance to chemo];finished Dec 2017.  # Repeat cystoscopy February 2018/limited- no obvious evidence of tumor. She is awaiting repeat cystoscopy and stent placement later in the month.  #  Anemia/hemoglobin 7.8 ; check Iron studies today; if low recommend IV iron. Also recommend PRBC transfusion x1 tomorrow. If hemoglobin continues running low post iron supplementation; would recommend Aranesp.  # Severe stage IV CKD- creatinine 4.5; follows up with nephrology. Not on dialysis.  # Follow-up in 4 weeks/labs.

## 2016-09-17 NOTE — Progress Notes (Signed)
[facility] OFFICE PROGRESS NOTE  Patient Care Team: Madelyn Brunner, MD as PCP - General (Internal Medicine)  Cancer Staging No matching staging information was found for the patient.   Oncology History    #AUG 2017 TURBT- [Dr.Brandon] Large nodular proximally 5 cm bladder mass involving the left hemitrigone extending up the left lateral bladder wall, 2 discrete nodules with bridge of abnormal tissue; Tumor clinically T2. TURBT- Tumor invades muscularis propria [detrusor muscle]. NOT candidate for cystectomy.  # SEP 2017- RT- GEM weekly.  # Acute Renal failure/CKD IV / C.diff [aug 2017]     Cancer of trigone of urinary bladder ([place])   03/02/2016 Initial Diagnosis    Cancer of trigone of urinary bladder ([place])          INTERVAL HISTORY:  DRAKE LANDING 76 y.o.  female pleasant patient above history of Recently diagnosed muscle invasive bladder cancer; not a candidate for cystectomy -s/p definitive radiation with weekly gemcitabine [discontinued gem post 2 treatments] is here for follow-up.  In the interim patient was evaluated by urology with a cystoscopy; no obvious tumor was noted. She is awaiting a repeat cystoscopy with stent placement later in the month.  Patient completes of fatigue. Complains of "burning pain in her thighs bilaterally". No falls. She is walking herself. She is not in a wheelchair. No admissions to  hospital. She is not on dialysis. Complains of shortness of breath on exertion.   REVIEW OF SYSTEMS:  A complete 10 point review of system is done which is negative except mentioned above/history of present illness.   PAST MEDICAL HISTORY :  Past Medical History:  Diagnosis Date  . Anemia associated with chronic renal failure 2017   blood transfusion last week 10/17  . Arthritis   . Cancer ([place]) 2017   bladder  . Chronic kidney disease   . 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  dr. Candiss Norse and dr. Holley Raring follow her  .  COPD (chronic obstructive pulmonary disease) ([place])   . Diabetes mellitus without complication ([place])   . Dyspnea    with exertion  . GERD (gastroesophageal reflux disease)   . Hypertension   . Personal history of chemotherapy   . Sleep apnea    uses cpap  . Urinary obstruction 01/2016    PAST SURGICAL HISTORY :   Past Surgical History:  Procedure Laterality Date  . ABDOMINAL HYSTERECTOMY    . CYSTOSCOPY W/ RETROGRADES Bilateral 02/17/2016   Procedure: CYSTOSCOPY WITH RETROGRADE PYELOGRAM;  Surgeon: Hollice Espy, MD;  Location: [facility];  Service: Urology;  Laterality: Bilateral;  . CYSTOSCOPY W/ URETERAL STENT PLACEMENT Left 05/12/2016   Procedure: CYSTOSCOPY WITH STENT REPLACEMENT;  Surgeon: Hollice Espy, MD;  Location: [facility];  Service: Urology;  Laterality: Left;  . CYSTOSCOPY WITH STENT PLACEMENT Left 01/21/2016   Procedure: CYSTOSCOPY WITH double J STENT PLACEMENT;  Surgeon: Franchot Gallo, MD;  Location: [facility];  Service: Urology;  Laterality: Left;  . KIDNEY SURGERY  01/21/2016   IR NEPHROSTOMY PLACEMENT LEFT   . PERIPHERAL VASCULAR CATHETERIZATION N/A 04/07/2016   Procedure: Glori Luis Cath Insertion;  Surgeon: Katha Cabal, MD;  Location: [facility];  Service: Cardiovascular;  Laterality: N/A;  . ROTATOR CUFF REPAIR     both sides  . TRANSURETHRAL RESECTION OF BLADDER TUMOR N/A 02/17/2016   Procedure: TRANSURETHRAL RESECTION OF BLADDER TUMOR (TURBT)-LARGE;  Surgeon: Hollice Espy, MD;  Location: [facility];  Service: Urology;  Laterality: N/A;  . URETEROSCOPY Left 02/17/2016   Procedure: URETEROSCOPY;  Surgeon: Hollice Espy, MD;  Location: [facility];  Service: Urology;  Laterality: Left;    FAMILY HISTORY :   Family History  Problem Relation Age of Onset  . Diabetes Mother   . Cancer Father   . Breast cancer Neg Hx   . Kidney disease Neg Hx     SOCIAL HISTORY:   Social History  Substance Use Topics  . Smoking status: Former Smoker    Types:  Cigarettes    Quit date: 07/20/2002  . Smokeless tobacco: Never Used     Comment: 01/22/2016   "  quit smoking many years ago "  . Alcohol use No    ALLERGIES:  is allergic to percocet [oxycodone-acetaminophen].  MEDICATIONS:  Current Outpatient Prescriptions  Medication Sig Dispense Refill  . acetaminophen (TYLENOL) 325 MG tablet Take 2 tablets (650 mg total) by mouth every 6 (six) hours as needed for mild pain.    . Albuterol Sulfate (PROAIR RESPICLICK) 092 (90 Base) MCG/ACT AEPB Inhale 2 puffs into the lungs 4 (four) times daily as needed (wheezing).     Marland Kitchen atorvastatin (LIPITOR) 40 MG tablet Take 40 mg by mouth daily at 6 PM.     . docusate sodium (COLACE) 100 MG capsule Take 1 capsule (100 mg total) by mouth 2 (two) times daily as needed for mild constipation. 10 capsule 0  . esomeprazole (NEXIUM) 40 MG capsule Take 40 mg by mouth daily at 12 noon.    . ferrous sulfate 325 (65 FE) MG EC tablet Take 325 mg by mouth daily.    . furosemide (LASIX) 40 MG tablet Take 40 mg by mouth 2 (two) times daily.    Marland Kitchen gabapentin (NEURONTIN) 300 MG capsule Take 600 mg by mouth 3 (three) times daily.    Marland Kitchen lidocaine (XYLOCAINE) 5 % ointment Apply topically.    Marland Kitchen MYRBETRIQ 25 MG TB24 tablet TAKE 1 TABLET (25 MG TOTAL) BY MOUTH DAILY. 30 tablet 5  . NON FORMULARY Place 1 Units into the nose at bedtime. CPAP Time of use 2100-0600    . sodium bicarbonate 650 MG tablet Take 650 mg by mouth 2 (two) times daily.    . TRAVATAN Z 0.004 % SOLN ophthalmic solution PLACE 1 DROP INTO BOTH EYES EVERY NIGHT AT BEDTIME  5   No current facility-administered medications for this visit.    Facility-Administered Medications Ordered in Other Visits  Medication Dose Route Frequency Provider Last Rate Last Dose  . sodium chloride flush (NS) 0.9 % injection 10 mL  10 mL Intravenous PRN Cammie Sickle, MD   10 mL at 09/17/16 1011    PHYSICAL EXAMINATION: ECOG PERFORMANCE STATUS: 2 - Symptomatic, <50% confined to  bed  BP 133/75 (BP Location: Left Arm, Patient Position: Sitting)   Pulse 73   Temp 97.9 F (36.6 C) (Tympanic)   Wt 159 lb 6 oz (72.3 kg)   BMI 24.23 kg/m   Filed Weights   09/17/16 1037  Weight: 159 lb 6 oz (72.3 kg)    GENERAL: Well-nourished well-developed; Alert, no distress and comfortable.  With her husband. She is walking by herself. Obese. EYES: no pallor or icterus OROPHARYNX: no thrush or ulceration; good dentition  NECK: supple, no masses felt LYMPH:  no palpable lymphadenopathy in the cervical, axillary or inguinal regions LUNGS: clear to auscultation and  No wheeze or crackles HEART/CVS: regular rate & rhythm and no murmurs; No lower extremity  edema ABDOMEN:abdomen soft, non-tender and normal bowel sounds Musculoskeletal:no cyanosis of digits and no clubbing  PSYCH: alert & oriented x 3 with fluent speech NEURO: no focal motor/sensory deficits SKIN:  no rashes or significant lesions  LABORATORY DATA:  I have reviewed the data as listed    Component Value Date/Time   NA 139 09/17/2016 0957   NA 138 08/07/2012 0433   K 4.2 09/17/2016 0957   K 3.2 (L) 10/30/2014 1410   CL 112 (H) 09/17/2016 0957   CL 104 08/07/2012 0433   CO2 21 (L) 09/17/2016 0957   CO2 25 08/07/2012 0433   GLUCOSE 102 (H) 09/17/2016 0957   GLUCOSE 172 (H) 08/07/2012 0433   BUN 45 (H) 09/17/2016 0957   BUN 63 (H) 08/07/2012 0433   CREATININE 4.07 (H) 09/17/2016 0957   CREATININE 2.48 (H) 08/07/2012 0433   CALCIUM 8.6 (L) 09/17/2016 0957   CALCIUM 9.1 08/07/2012 0433   PROT 8.4 (H) 09/17/2016 0957   PROT 8.9 (H) 08/02/2012 0958   ALBUMIN 3.2 (L) 09/17/2016 0957   ALBUMIN 3.9 08/02/2012 0958   AST 14 (L) 09/17/2016 0957   AST 21 08/02/2012 0958   ALT 10 (L) 09/17/2016 0957   ALT 19 08/02/2012 0958   ALKPHOS 76 09/17/2016 0957   ALKPHOS 107 08/02/2012 0958   BILITOT 0.2 (L) 09/17/2016 0957   BILITOT 0.3 08/02/2012 0958   GFRNONAA 10 (L) 09/17/2016 0957   GFRNONAA 19 (L) 08/07/2012  0433   GFRAA 11 (L) 09/17/2016 0957   GFRAA 22 (L) 08/07/2012 0433    No results found for: SPEP, UPEP  Lab Results  Component Value Date   WBC 6.2 09/17/2016   NEUTROABS 4.1 09/17/2016   HGB 7.8 (L) 09/17/2016   HCT 24.0 (L) 09/17/2016   MCV 92.7 09/17/2016   PLT 267 09/17/2016      Chemistry      Component Value Date/Time   NA 139 09/17/2016 0957   NA 138 08/07/2012 0433   K 4.2 09/17/2016 0957   K 3.2 (L) 10/30/2014 1410   CL 112 (H) 09/17/2016 0957   CL 104 08/07/2012 0433   CO2 21 (L) 09/17/2016 0957   CO2 25 08/07/2012 0433   BUN 45 (H) 09/17/2016 0957   BUN 63 (H) 08/07/2012 0433   CREATININE 4.07 (H) 09/17/2016 0957   CREATININE 2.48 (H) 08/07/2012 0433      Component Value Date/Time   CALCIUM 8.6 (L) 09/17/2016 0957   CALCIUM 9.1 08/07/2012 0433   ALKPHOS 76 09/17/2016 0957   ALKPHOS 107 08/02/2012 0958   AST 14 (L) 09/17/2016 0957   AST 21 08/02/2012 0958   ALT 10 (L) 09/17/2016 0957   ALT 19 08/02/2012 0958   BILITOT 0.2 (L) 09/17/2016 0957   BILITOT 0.3 08/02/2012 0958       RADIOGRAPHIC STUDIES: I have personally reviewed the radiological images as listed and agreed with the findings in the report. No results found.   ASSESSMENT & PLAN:  Cancer of trigone of urinary bladder ([place]) # Muscle invasive bladder cancer/c T2/ stage II- not a candidate for cystectomy given multiple comorbidities.  s/p RT- and weekly gem x2 [poor tolerance to chemo];finished Dec 2017.  # Repeat cystoscopy February 2018/limited- no obvious evidence of tumor. She is awaiting repeat cystoscopy and stent placement later in the month.  #  Anemia/hemoglobin 7.8 ; check Iron studies today; if low recommend IV iron. Also recommend PRBC transfusion x1 tomorrow. If hemoglobin continues running low post  iron supplementation; would recommend Aranesp.  # Severe stage IV CKD- creatinine 4.5; follows up with nephrology. Not on dialysis.  # Follow-up in 4 weeks/labs.    No orders  of the defined types were placed in this encounter.  All questions were answered. The patient knows to call the clinic with any problems, questions or concerns.      Cammie Sickle, MD 09/17/2016 11:58 AM

## 2016-09-18 ENCOUNTER — Inpatient Hospital Stay: Payer: Medicare Other

## 2016-09-18 DIAGNOSIS — D631 Anemia in chronic kidney disease: Secondary | ICD-10-CM

## 2016-09-18 DIAGNOSIS — C670 Malignant neoplasm of trigone of bladder: Secondary | ICD-10-CM | POA: Diagnosis not present

## 2016-09-18 DIAGNOSIS — N184 Chronic kidney disease, stage 4 (severe): Secondary | ICD-10-CM

## 2016-09-18 MED ORDER — DIPHENHYDRAMINE HCL 25 MG PO CAPS
25.0000 mg | ORAL_CAPSULE | Freq: Once | ORAL | Status: AC
  Administered 2016-09-18: 25 mg via ORAL
  Filled 2016-09-18: qty 1

## 2016-09-18 MED ORDER — HEPARIN SOD (PORK) LOCK FLUSH 100 UNIT/ML IV SOLN
250.0000 [IU] | INTRAVENOUS | Status: AC | PRN
  Administered 2016-09-18: 250 [IU]
  Filled 2016-09-18: qty 5

## 2016-09-18 MED ORDER — SODIUM CHLORIDE 0.9% FLUSH
3.0000 mL | INTRAVENOUS | Status: DC | PRN
  Filled 2016-09-18: qty 3

## 2016-09-18 MED ORDER — FUROSEMIDE 10 MG/ML IJ SOLN
20.0000 mg | Freq: Once | INTRAMUSCULAR | Status: AC
  Administered 2016-09-18: 20 mg via INTRAVENOUS
  Filled 2016-09-18: qty 2

## 2016-09-18 MED ORDER — ACETAMINOPHEN 325 MG PO TABS
650.0000 mg | ORAL_TABLET | Freq: Once | ORAL | Status: AC
  Administered 2016-09-18: 650 mg via ORAL
  Filled 2016-09-18: qty 2

## 2016-09-19 LAB — BPAM RBC
BLOOD PRODUCT EXPIRATION DATE: 201803272359
ISSUE DATE / TIME: 201803020944
UNIT TYPE AND RH: 5100

## 2016-09-19 LAB — TYPE AND SCREEN
ABO/RH(D): O POS
Antibody Screen: NEGATIVE
UNIT DIVISION: 0

## 2016-10-02 ENCOUNTER — Encounter
Admission: RE | Admit: 2016-10-02 | Discharge: 2016-10-02 | Disposition: A | Discharge status: Home / Self Care | Payer: Medicare Other | Source: Ambulatory Visit | Attending: Urology | Admitting: Urology

## 2016-10-02 DIAGNOSIS — Z01818 Encounter for other preprocedural examination: Secondary | ICD-10-CM | POA: Insufficient documentation

## 2016-10-02 HISTORY — DX: Dependence on supplemental oxygen: Z99.81

## 2016-10-02 HISTORY — DX: Low back pain, unspecified: M54.50

## 2016-10-02 HISTORY — DX: Low back pain: M54.5

## 2016-10-02 HISTORY — DX: Hematuria, unspecified: R31.9

## 2016-10-02 HISTORY — DX: Hyperlipidemia, unspecified: E78.5

## 2016-10-02 LAB — URINALYSIS, ROUTINE W REFLEX MICROSCOPIC
Bacteria, UA: NONE SEEN
Bilirubin Urine: NEGATIVE
GLUCOSE, UA: NEGATIVE mg/dL
KETONES UR: NEGATIVE mg/dL
NITRITE: NEGATIVE
PROTEIN: 100 mg/dL — AB
Specific Gravity, Urine: 1.01 (ref 1.005–1.030)
pH: 7 (ref 5.0–8.0)

## 2016-10-02 LAB — CBC
HEMATOCRIT: 30.5 % — AB (ref 35.0–47.0)
HEMOGLOBIN: 9.8 g/dL — AB (ref 12.0–16.0)
MCH: 29.6 pg (ref 26.0–34.0)
MCHC: 32.2 g/dL (ref 32.0–36.0)
MCV: 91.9 fL (ref 80.0–100.0)
Platelets: 237 10*3/uL (ref 150–440)
RBC: 3.32 MIL/uL — AB (ref 3.80–5.20)
RDW: 14.8 % — ABNORMAL HIGH (ref 11.5–14.5)
WBC: 6.8 10*3/uL (ref 3.6–11.0)

## 2016-10-02 LAB — BASIC METABOLIC PANEL
ANION GAP: 7 (ref 5–15)
BUN: 54 mg/dL — ABNORMAL HIGH (ref 6–20)
CALCIUM: 8.9 mg/dL (ref 8.9–10.3)
CHLORIDE: 112 mmol/L — AB (ref 101–111)
CO2: 21 mmol/L — AB (ref 22–32)
Creatinine, Ser: 4.64 mg/dL — ABNORMAL HIGH (ref 0.44–1.00)
GFR calc non Af Amer: 8 mL/min — ABNORMAL LOW (ref >60)
GFR, EST AFRICAN AMERICAN: 10 mL/min — AB (ref >60)
Glucose, Bld: 88 mg/dL (ref 65–99)
POTASSIUM: 4.1 mmol/L (ref 3.5–5.1)
Sodium: 140 mmol/L (ref 135–145)

## 2016-10-02 NOTE — Pre-Procedure Instructions (Signed)
Has a port a cath Rt chest

## 2016-10-02 NOTE — Patient Instructions (Signed)
Your procedure is scheduled on:  10/12/16 Mon Report to Same Day Surgery 2nd floor medical mall [facility] Entrance-take elevator on left to 2nd floor.  Check in with surgery information desk.) To find out your arrival time please call [phone] between 1PM - 3PM on 10/09/16 Fri  Remember: Instructions that are not followed completely may result in serious medical risk, up to and including death, or upon the discretion of your surgeon and anesthesiologist your surgery may need to be rescheduled.    _x___ 1. Do not eat food or drink liquids after midnight. No gum chewing or                              hard candies.     __x__ 2. No Alcohol for 24 hours before or after surgery.   __x__3. No Smoking for 24 prior to surgery.   ____  4. Bring all medications with you on the day of surgery if instructed.    __x__ 5. Notify your doctor if there is any change in your medical condition     (cold, fever, infections).     Do not wear jewelry, make-up, hairpins, clips or nail polish.  Do not wear lotions, powders, or perfumes. You may wear deodorant.  Do not shave 48 hours prior to surgery. Men may shave face and neck.  Do not bring valuables to the hospital.    [facility] is not responsible for any belongings or valuables.               Contacts, dentures or bridgework may not be worn into surgery.  Leave your suitcase in the car. After surgery it may be brought to your room.  For patients admitted to the hospital, discharge time is determined by your                       treatment team.   Patients discharged the day of surgery will not be allowed to drive home.  You will need someone to drive you home and stay with you the night of your procedure.    Please read over the following fact sheets that you were given:   [facility] Preparing for Surgery and or MRSA Information   _x___ Take anti-hypertensive (unless it includes a diuretic), cardiac, seizure, asthma,     anti-reflux and  psychiatric medicines. These include:  1. gabapentin (NEURONTIN  2.esomeprazole ([place]  3.  4.  5.  6.  ____Fleets enema or Magnesium Citrate as directed.   _x___ Use CHG Soap or sage wipes as directed on instruction sheet   ____ Use inhalers on the day of surgery and bring to hospital day of surgery  ____ Stop Metformin and Janumet 2 days prior to surgery.    ____ Take 1/2 of usual insulin dose the night before surgery and none on the morning     surgery.   _x___ Follow recommendations from Cardiologist, Pulmonologist or PCP regarding          stopping Aspirin, Coumadin, Pllavix ,Eliquis, Effient, or Pradaxa, and Pletal.  X____Stop Anti-inflammatories such as Advil, Aleve, Ibuprofen, Motrin, Naproxen, Naprosyn, Goodies powders or aspirin products. OK to take Tylenol and                          Celebrex.   _x___ Stop supplements until after surgery.  But may continue Vitamin  D, Vitamin B,       and multivitamin.   ____ Bring C-Pap to the hospital.

## 2016-10-03 LAB — URINE CULTURE

## 2016-10-05 ENCOUNTER — Telehealth: Payer: Self-pay

## 2016-10-05 DIAGNOSIS — N390 Urinary tract infection, site not specified: Secondary | ICD-10-CM

## 2016-10-05 MED ORDER — CIPROFLOXACIN HCL 250 MG PO TABS: 250.0000 mg | ORAL_TABLET | Freq: Two times a day (BID) | ORAL | 0 refills | Status: DC

## 2016-10-05 NOTE — Telephone Encounter (Signed)
Per Dr. Cherrie Gauze note pre-op urine culture grew mixed flora will treat with 3 days prior to surgery Cipro 250 BID. Patient was notified of this and verbalized agreement with this plan. Script was sent to patient's pharm

## 2016-10-05 NOTE — Telephone Encounter (Signed)
-----   Message from Hollice Espy, MD sent at 10/05/2016  8:19 AM EDT ----- Even though her urine culture was mixed flora, like to treat her 3 days prior to surgery with Cipro 250 twice a day as a precaution.  Hollice Espy, MD

## 2016-10-05 NOTE — Telephone Encounter (Signed)
Spoke with pt in reference to needing abx prior to surgery. Pt stated that she has already picked up medication.

## 2016-10-05 NOTE — Pre-Procedure Instructions (Signed)
URINE CULTURE REPORT CALLED AND FAXED TO [facility] AT DR Cherrie Gauze

## 2016-10-11 MED ORDER — FLUCONAZOLE 100MG IVPB
100.0000 mg | Freq: Once | INTRAVENOUS | Status: AC
  Administered 2016-10-12: 100 mg via INTRAVENOUS
  Filled 2016-10-11: qty 50

## 2016-10-11 MED ORDER — DEXTROSE 5 % IV SOLN
1.0000 g | INTRAVENOUS | Status: AC
  Administered 2016-10-12: 1 g via INTRAVENOUS
  Filled 2016-10-11: qty 10

## 2016-10-12 ENCOUNTER — Encounter
Admission: RE | Disposition: A | Discharge status: Home / Self Care | Payer: Self-pay | Source: Ambulatory Visit | Attending: Urology

## 2016-10-12 ENCOUNTER — Encounter: Payer: Self-pay | Admitting: Anesthesiology

## 2016-10-12 ENCOUNTER — Ambulatory Visit: Payer: Medicare Other | Admitting: Anesthesiology

## 2016-10-12 ENCOUNTER — Ambulatory Visit
Admission: RE | Admit: 2016-10-12 | Discharge: 2016-10-12 | Disposition: A | Discharge status: Home / Self Care | Payer: Medicare Other | Source: Ambulatory Visit | Attending: Urology | Admitting: Urology

## 2016-10-12 DIAGNOSIS — I129 Hypertensive chronic kidney disease with stage 1 through stage 4 chronic kidney disease, or unspecified chronic kidney disease: Secondary | ICD-10-CM | POA: Diagnosis not present

## 2016-10-12 DIAGNOSIS — Z923 Personal history of irradiation: Secondary | ICD-10-CM | POA: Insufficient documentation

## 2016-10-12 DIAGNOSIS — D631 Anemia in chronic kidney disease: Secondary | ICD-10-CM | POA: Diagnosis not present

## 2016-10-12 DIAGNOSIS — Z87891 Personal history of nicotine dependence: Secondary | ICD-10-CM | POA: Insufficient documentation

## 2016-10-12 DIAGNOSIS — Z9221 Personal history of antineoplastic chemotherapy: Secondary | ICD-10-CM | POA: Diagnosis not present

## 2016-10-12 DIAGNOSIS — N179 Acute kidney failure, unspecified: Secondary | ICD-10-CM | POA: Diagnosis not present

## 2016-10-12 DIAGNOSIS — K219 Gastro-esophageal reflux disease without esophagitis: Secondary | ICD-10-CM | POA: Diagnosis not present

## 2016-10-12 DIAGNOSIS — Z9981 Dependence on supplemental oxygen: Secondary | ICD-10-CM | POA: Diagnosis not present

## 2016-10-12 DIAGNOSIS — E1122 Type 2 diabetes mellitus with diabetic chronic kidney disease: Secondary | ICD-10-CM | POA: Insufficient documentation

## 2016-10-12 DIAGNOSIS — N131 Hydronephrosis with ureteral stricture, not elsewhere classified: Secondary | ICD-10-CM | POA: Diagnosis not present

## 2016-10-12 DIAGNOSIS — N135 Crossing vessel and stricture of ureter without hydronephrosis: Secondary | ICD-10-CM | POA: Diagnosis present

## 2016-10-12 DIAGNOSIS — N132 Hydronephrosis with renal and ureteral calculous obstruction: Secondary | ICD-10-CM | POA: Diagnosis not present

## 2016-10-12 DIAGNOSIS — Z7984 Long term (current) use of oral hypoglycemic drugs: Secondary | ICD-10-CM | POA: Insufficient documentation

## 2016-10-12 DIAGNOSIS — J449 Chronic obstructive pulmonary disease, unspecified: Secondary | ICD-10-CM | POA: Insufficient documentation

## 2016-10-12 DIAGNOSIS — Z8551 Personal history of malignant neoplasm of bladder: Secondary | ICD-10-CM | POA: Insufficient documentation

## 2016-10-12 DIAGNOSIS — M1990 Unspecified osteoarthritis, unspecified site: Secondary | ICD-10-CM | POA: Diagnosis not present

## 2016-10-12 DIAGNOSIS — N184 Chronic kidney disease, stage 4 (severe): Secondary | ICD-10-CM | POA: Diagnosis not present

## 2016-10-12 DIAGNOSIS — G4730 Sleep apnea, unspecified: Secondary | ICD-10-CM | POA: Insufficient documentation

## 2016-10-12 DIAGNOSIS — C679 Malignant neoplasm of bladder, unspecified: Secondary | ICD-10-CM

## 2016-10-12 HISTORY — PX: CYSTOSCOPY W/ RETROGRADES: SHX1426

## 2016-10-12 HISTORY — PX: CYSTOSCOPY W/ URETERAL STENT PLACEMENT: SHX1429

## 2016-10-12 HISTORY — PX: URETEROSCOPY: SHX842

## 2016-10-12 HISTORY — PX: CYSTOSCOPY WITH STENT PLACEMENT: SHX5790

## 2016-10-12 LAB — GLUCOSE, CAPILLARY
GLUCOSE-CAPILLARY: 90 mg/dL (ref 65–99)
Glucose-Capillary: 84 mg/dL (ref 65–99)

## 2016-10-12 SURGERY — CYSTOSCOPY, FLEXIBLE, WITH STENT REPLACEMENT
Anesthesia: General | Site: Ureter | Laterality: Right

## 2016-10-12 MED ORDER — IOTHALAMATE MEGLUMINE 43 % IV SOLN
INTRAVENOUS | Status: DC | PRN
Start: 2016-10-12 — End: 2016-10-12
  Administered 2016-10-12: 15 mL

## 2016-10-12 MED ORDER — SUGAMMADEX SODIUM 200 MG/2ML IV SOLN
INTRAVENOUS | Status: AC
  Filled 2016-10-12: qty 2

## 2016-10-12 MED ORDER — MIDAZOLAM HCL 2 MG/2ML IJ SOLN
INTRAMUSCULAR | Status: AC
  Filled 2016-10-12: qty 2

## 2016-10-12 MED ORDER — NEOSTIGMINE METHYLSULFATE 10 MG/10ML IV SOLN
INTRAVENOUS | Status: DC | PRN
  Administered 2016-10-12: 4 mg via INTRAVENOUS

## 2016-10-12 MED ORDER — ONDANSETRON HCL 4 MG/2ML IJ SOLN: 4.0000 mg | Freq: Once | INTRAMUSCULAR | Status: DC | PRN

## 2016-10-12 MED ORDER — FENTANYL CITRATE (PF) 100 MCG/2ML IJ SOLN
INTRAMUSCULAR | Status: DC | PRN
  Administered 2016-10-12 (×2): 50 ug via INTRAVENOUS

## 2016-10-12 MED ORDER — FAMOTIDINE 20 MG PO TABS
20.0000 mg | ORAL_TABLET | Freq: Once | ORAL | Status: AC
  Administered 2016-10-12: 20 mg via ORAL

## 2016-10-12 MED ORDER — SODIUM CHLORIDE 0.9 % IV SOLN
INTRAVENOUS | Status: DC
  Administered 2016-10-12: 08:00:00 via INTRAVENOUS

## 2016-10-12 MED ORDER — PROPOFOL 10 MG/ML IV BOLUS
INTRAVENOUS | Status: AC
  Filled 2016-10-12: qty 20

## 2016-10-12 MED ORDER — HYDROCODONE-ACETAMINOPHEN 5-325 MG PO TABS: 1.0000 | ORAL_TABLET | Freq: Four times a day (QID) | ORAL | 0 refills | Status: DC | PRN

## 2016-10-12 MED ORDER — ONDANSETRON HCL 4 MG/2ML IJ SOLN
INTRAMUSCULAR | Status: DC | PRN
  Administered 2016-10-12: 4 mg via INTRAVENOUS

## 2016-10-12 MED ORDER — GLYCOPYRROLATE 0.2 MG/ML IJ SOLN
INTRAMUSCULAR | Status: AC
  Filled 2016-10-12: qty 3

## 2016-10-12 MED ORDER — ROCURONIUM BROMIDE 100 MG/10ML IV SOLN
INTRAVENOUS | Status: DC | PRN
  Administered 2016-10-12: 30 mg via INTRAVENOUS

## 2016-10-12 MED ORDER — SODIUM CHLORIDE 0.9 % IJ SOLN
INTRAMUSCULAR | Status: AC
  Filled 2016-10-12: qty 10

## 2016-10-12 MED ORDER — SUCCINYLCHOLINE CHLORIDE 20 MG/ML IJ SOLN
INTRAMUSCULAR | Status: AC
  Filled 2016-10-12: qty 1

## 2016-10-12 MED ORDER — FENTANYL CITRATE (PF) 100 MCG/2ML IJ SOLN
INTRAMUSCULAR | Status: AC
  Administered 2016-10-12: 25 ug via INTRAVENOUS
  Filled 2016-10-12: qty 2

## 2016-10-12 MED ORDER — ONDANSETRON HCL 4 MG/2ML IJ SOLN
INTRAMUSCULAR | Status: AC
  Filled 2016-10-12: qty 2

## 2016-10-12 MED ORDER — GLYCOPYRROLATE 0.2 MG/ML IJ SOLN
INTRAMUSCULAR | Status: DC | PRN
  Administered 2016-10-12: 0.6 mg via INTRAVENOUS

## 2016-10-12 MED ORDER — FENTANYL CITRATE (PF) 100 MCG/2ML IJ SOLN
25.0000 ug | INTRAMUSCULAR | Status: DC | PRN
  Administered 2016-10-12 (×2): 25 ug via INTRAVENOUS

## 2016-10-12 MED ORDER — PROPOFOL 10 MG/ML IV BOLUS
INTRAVENOUS | Status: DC | PRN
  Administered 2016-10-12: 80 mg via INTRAVENOUS
  Administered 2016-10-12: 120 mg via INTRAVENOUS

## 2016-10-12 MED ORDER — NEOSTIGMINE METHYLSULFATE 5 MG/5ML IV SOSY
PREFILLED_SYRINGE | INTRAVENOUS | Status: AC
  Filled 2016-10-12: qty 5

## 2016-10-12 MED ORDER — MIDAZOLAM HCL 2 MG/2ML IJ SOLN
INTRAMUSCULAR | Status: DC | PRN
  Administered 2016-10-12: 1 mg via INTRAVENOUS

## 2016-10-12 MED ORDER — PHENYLEPHRINE HCL 10 MG/ML IJ SOLN
INTRAMUSCULAR | Status: DC | PRN
  Administered 2016-10-12: 100 ug via INTRAVENOUS
  Administered 2016-10-12: 150 ug via INTRAVENOUS
  Administered 2016-10-12 (×3): 100 ug via INTRAVENOUS

## 2016-10-12 MED ORDER — DEXAMETHASONE SODIUM PHOSPHATE 10 MG/ML IJ SOLN
INTRAMUSCULAR | Status: AC
  Filled 2016-10-12: qty 1

## 2016-10-12 MED ORDER — FAMOTIDINE 20 MG PO TABS
ORAL_TABLET | ORAL | Status: AC
  Administered 2016-10-12: 20 mg via ORAL
  Filled 2016-10-12: qty 1

## 2016-10-12 MED ORDER — FENTANYL CITRATE (PF) 100 MCG/2ML IJ SOLN
INTRAMUSCULAR | Status: AC
  Filled 2016-10-12: qty 2

## 2016-10-12 SURGICAL SUPPLY — 35 items
BAG DRAIN CYSTO-URO LG1000N (MISCELLANEOUS) ×6 IMPLANT
BAG URO DRAIN 2000ML W/SPOUT (MISCELLANEOUS) ×6 IMPLANT
CATH FOLEY 2WAY  5CC 16FR (CATHETERS)
CATH URETL 5X70 OPEN END (CATHETERS) ×6 IMPLANT
CATH URTH 16FR FL 2W BLN LF (CATHETERS) IMPLANT
CONRAY 43 FOR UROLOGY 50M (MISCELLANEOUS) ×6 IMPLANT
DRAPE UTILITY 15X26 TOWEL STRL (DRAPES) ×6 IMPLANT
DRSG TELFA 4X3 1S NADH ST (GAUZE/BANDAGES/DRESSINGS) ×6 IMPLANT
ELECT LOOP 22F BIPOLAR SML (ELECTROSURGICAL)
ELECT REM PT RETURN 9FT ADLT (ELECTROSURGICAL)
ELECTRODE LOOP 22F BIPOLAR SML (ELECTROSURGICAL) IMPLANT
ELECTRODE REM PT RTRN 9FT ADLT (ELECTROSURGICAL) IMPLANT
GLOVE BIO SURGEON STRL SZ 6.5 (GLOVE) ×6 IMPLANT
GOWN STRL REUS W/ TWL LRG LVL3 (GOWN DISPOSABLE) ×10 IMPLANT
GOWN STRL REUS W/TWL LRG LVL3 (GOWN DISPOSABLE) ×2
KIT RM TURNOVER CYSTO AR (KITS) ×6 IMPLANT
LOOP CUT BIPOLAR 24F LRG (ELECTROSURGICAL) IMPLANT
NDL SAFETY ECLIPSE 18X1.5 (NEEDLE) ×5 IMPLANT
NEEDLE HYPO 18GX1.5 SHARP (NEEDLE) ×1
PACK CYSTO AR (MISCELLANEOUS) ×6 IMPLANT
SCRUB POVIDONE IODINE 4 OZ (MISCELLANEOUS) ×6 IMPLANT
SENSORWIRE 0.038 NOT ANGLED (WIRE) ×6
SET CYSTO W/LG BORE CLAMP LF (SET/KITS/TRAYS/PACK) ×6 IMPLANT
SET IRRIG Y TYPE TUR BLADDER L (SET/KITS/TRAYS/PACK) ×6 IMPLANT
SET IRRIGATING DISP (SET/KITS/TRAYS/PACK) ×6 IMPLANT
SOL .9 NS 3000ML IRR  AL (IV SOLUTION) ×1
SOL .9 NS 3000ML IRR UROMATIC (IV SOLUTION) ×5 IMPLANT
STENT URET 6FRX24 CONTOUR (STENTS) IMPLANT
STENT URET 6FRX26 CONTOUR (STENTS) IMPLANT
STENT URO INLAY 6FRX24CM (STENTS) ×12 IMPLANT
SURGILUBE 2OZ TUBE FLIPTOP (MISCELLANEOUS) ×6 IMPLANT
SYRINGE IRR TOOMEY STRL 70CC (SYRINGE) ×6 IMPLANT
WATER STERILE IRR 1000ML POUR (IV SOLUTION) ×6 IMPLANT
WATER STERILE IRR 3000ML UROMA (IV SOLUTION) ×6 IMPLANT
WIRE SENSOR 0.038 NOT ANGLED (WIRE) ×5 IMPLANT

## 2016-10-12 NOTE — Transfer of Care (Signed)
Immediate Anesthesia Transfer of Care Note  Patient: LEILENE DIPRIMA  Procedure(s) Performed: Procedure(s): CYSTOSCOPY WITH STENT REPLACEMENT (Left) CYSTOSCOPY WITH RETROGRADE PYELOGRAM (Bilateral) URETEROSCOPY (Right) CYSTOSCOPY WITH STENT PLACEMENT (Right)  Patient Location: PACU  Anesthesia Type:General  Level of Consciousness: awake and patient cooperative  Airway & Oxygen Therapy: Patient Spontanous Breathing and Patient connected to nasal cannula oxygen  Post-op Assessment: Report given to RN and Post -op Vital signs reviewed and stable  Post vital signs: Reviewed and stable  Last Vitals:  Vitals:   10/12/16 0657 10/12/16 0905  BP: 135/60 130/76  Pulse: 76 81  Resp: 20 15  Temp: 36.5 C 36.3 C    Last Pain:  Vitals:   10/12/16 0905  TempSrc: Temporal         Complications: No apparent anesthesia complications

## 2016-10-12 NOTE — H&P (Signed)
08/27/16 --> patient seen and examined on 10/12/16 Cr appears to be rising UCx multiple species, being treated prophylactically   Past Medical History:  Diagnosis Date  . Anemia associated with chronic renal failure 2017   blood transfusion last week 10/17  . Arthritis   . Cancer ([place]) 2017   bladder  . Chronic kidney disease   . 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  dr. Candiss Norse and dr. Holley Raring follow her  . COPD (chronic obstructive pulmonary disease) ([place])   . Diabetes mellitus without complication ([place])   . Dyspnea    with exertion  . Elevated lipids   . GERD (gastroesophageal reflux disease)   . Hematuria   . Hypertension   . Lower back pain   . Oxygen dependent    at hs  . Personal history of chemotherapy   . Sleep apnea    uses cpap  . Urinary obstruction 01/2016   Current Meds  Medication Sig  . acetaminophen (TYLENOL) 500 MG chewable tablet Chew 500 mg by mouth every 6 (six) hours as needed for pain.  . Albuterol Sulfate (PROAIR RESPICLICK) 767 (90 Base) MCG/ACT AEPB Inhale 2 puffs into the lungs 4 (four) times daily as needed (wheezing).   Marland Kitchen atorvastatin (LIPITOR) 40 MG tablet Take 40 mg by mouth every evening.   . cholecalciferol (VITAMIN D) 1000 units tablet Take 1,000 Units by mouth daily.  Marland Kitchen esomeprazole (NEXIUM) 40 MG capsule Take 40 mg by mouth daily at 12 noon.  . ferrous sulfate 325 (65 FE) MG EC tablet Take 325 mg by mouth daily.  Marland Kitchen gabapentin (NEURONTIN) 300 MG capsule Take 600 mg by mouth 3 (three) times daily.  Marland Kitchen lidocaine (XYLOCAINE) 5 % ointment Apply 1 application topically daily as needed for mild pain.   Marland Kitchen MYRBETRIQ 25 MG TB24 tablet TAKE 1 TABLET (25 MG TOTAL) BY MOUTH DAILY.  . NON FORMULARY Place 1 Units into the nose at bedtime. CPAP Time of use 2100-0600  . oxybutynin (DITROPAN) 5 MG tablet Take 5 mg by mouth 3 (three) times daily.  . sodium bicarbonate 650 MG tablet Take 650 mg by mouth 2 (two) times daily.  . TRAVATAN Z  0.004 % SOLN ophthalmic solution PLACE 1 DROP INTO BOTH EYES EVERY NIGHT AT BEDTIME   Lab Results  Component Value Date   CREATININE 4.64 (H) 10/02/2016     CC:     Chief Complaint  Patient presents with  . Cysto    HPI: 76 year old female with history of muscle invasive bladder cancer diagnosed in 01/2015 status post TURBT followed by palliative gemcitabine and concurrent radiation.  She currently has an indwelling left ureteral stent, Bard Optima which is placed in 04/2016.  Overall, since last visit, she seems to be improving. She underwent restaging CT chest abdomen pelvis which showed pulmonary nodule, 6 mm being followed.  Otherwise, no evidence of obvious metastatic disease on 06/2016.  Today, she reports having urinary frequency, urgency, incontinence which is unchanged from her baseline. Previous urine cultures have been negative and is likely related to her stent.   Blood pressure 129/62, pulse 84, height 5' 8"  (1.727 m), weight 161 lb 14.4 oz (73.4 kg). NED. A&Ox3.   No respiratory distress   Abd soft, NT, ND Normal external genitalia with patent urethral meatus  Cystoscopy Procedure Note  Patient identification was confirmed, informed consent was obtained, and patient was prepped using Betadine solution.  Lidocaine jelly was administered per urethral meatus.  Preoperative abx where received prior to procedure.    Procedure: - Flexible cystoscope introduced, without any difficulty.   - Thorough search of the bladder revealed:    normal urethral meatus    normal urothelium with moderate debris although cystoscopy somewhat limited due to patient's tolerance    left distal aspect of ureteral stent seen, clearly visualize with minimal encrustation   - Ureteral orifices were normal in position and appearance.  Post-Procedure: - Cystoscopy not particularly well tolerated today at bedside  Assessment/ Plan:  1. Malignant neoplasm of urinary  bladder, unspecified site [facility]) Cystoscopy today somewhat limited due to debris as well as patient's ability to tolerate the procedure No obvious tumor in bladder today Ms. Strothers will be due for stent exchange in late March  I have discussed with her today about proceeding to the operating room for more formal cystoscopy under anesthesia at that time, TURBT as needed, and left ureteral stent exchange. Risk and benefits were reviewed. She is agreeable with this plan. - Urinalysis, Complete - ciprofloxacin (CIPRO) tablet 500 mg; Take 1 tablet (500 mg total) by mouth once. - lidocaine (XYLOCAINE) 2 % jelly 1 application; Place 1 application into the urethra once. - Microscopic Examination  2. Hydronephrosis, left As above If there is no obvious residual tumor, consider performing a left retrograde and assessing the left ureteral drainage. Fairly symptomatic from left ureteral stent, if kidney drains promptly, consider removing stent   Schedule surgery next month as above   Hollice Espy, MD    Electronically signed by Hollice Espy, MD at 08/28/2016 9:25 AM

## 2016-10-12 NOTE — Anesthesia Post-op Follow-up Note (Cosign Needed)
Anesthesia QCDR form completed.        

## 2016-10-12 NOTE — Anesthesia Procedure Notes (Signed)
Procedure Name: Intubation Date/Time: 10/12/2016 8:19 AM Performed by: Darlyne Russian Pre-anesthesia Checklist: Patient identified, Emergency Drugs available, Suction available, Patient being monitored and Timeout performed Patient Re-evaluated:Patient Re-evaluated prior to inductionOxygen Delivery Method: Circle system utilized Preoxygenation: Pre-oxygenation with 100% oxygen Intubation Type: IV induction Ventilation: Oral airway inserted - appropriate to patient size and Mask ventilation with difficulty Laryngoscope Size: Mac and 3 Grade View: Grade IV Tube type: Oral Tube size: 7.0 mm Number of attempts: 1 Airway Equipment and Method: Stylet Placement Confirmation: ETT inserted through vocal cords under direct vision,  positive ETCO2 and breath sounds checked- equal and bilateral Secured at: 21 cm Tube secured with: Tape Dental Injury: Teeth and Oropharynx as per pre-operative assessment  Comments: Mask airway easy with 36m Oral Airway in place.

## 2016-10-12 NOTE — Op Note (Signed)
Date of procedure: 10/12/16  Preoperative diagnosis:  1. History of muscle invasive bladder cancer 2. Left ureteral obstruction 3. Chronic renal failure   Postoperative diagnosis:  1. Same as above 2. Right hydroureteronephrosis   Procedure: 1. Cystoscopy 2. Bilateral retrograde pyelogram 3. Left ureteral stent exchange 4. Right ureteroscopy (diagnostic) 5. Right ureteral stent placement  Surgeon: Hollice Espy, MD  Anesthesia: General  Complications: None  Intraoperative findings: Diffuse bladder erythema with relatively small capacity bladder, sluggish drainage of left ureter on retrograde pyelogram without any filling defects. New right hydroureteronephrosis down to level of the distal ureter. Ureteroscopy of the distal ureter revealed no intraluminal papillary tumors although there was fairly significant narrowing with hypovascular mucosa consistent with radiation changes. Right ureteral stent was placed.  EBL: minimal  Specimens: none  Drains: Bard Optima bilateral ureteral stents, 6 x 24 French double-J ureteral stents  Indication: Barbara [name] is a 76 y.o. patient with with a history of muscle invasive bladder cancer, chronic kidney disease who underwent TURBT, radiation, and chemotherapy for treatment of her bladder cancer she is not a surgical candidate for cystectomy. Her left ureteral orifice was involved with the tumor and has been managed with a left indwelling ureteral stent since that time. She returns today for cystoscopy, left ureteral stent exchange as well as evaluation of the right side..  After reviewing the management options for treatment, she elected to proceed with the above surgical procedure(s). We have discussed the potential benefits and risks of the procedure, side effects of the proposed treatment, the likelihood of the patient achieving the goals of the procedure, and any potential problems that might occur during the procedure or recuperation.  Informed consent has been obtained.  Description of procedure:  The patient was taken to the operating room and general anesthesia was induced.  The patient was placed in the dorsal lithotomy position, prepped and draped in the usual sterile fashion, and preoperative antibiotics were administered. A preoperative time-out was performed.   A 21 French scope was advanced per urethra into the bladder. Of note, there is diffuse bladder changes with areas of hypervascularity as well as erythema scattered throughout the bladder consistent with radiation changes. There is no obvious papillary tumor appreciated.  Attention was first turned to the right ureteral orifice which was cannulated using a 5 [place] open-ended ureteral catheter. A retropelvic and was performed on this side which revealed a few centimeters of tight right distal ureter with a distinct transition point within the pelvis with proximal hydroureteronephrosis all the way down to this point. There was blunting of the calyces and tortuosity of the proximal ureter consistent with high-grade obstruction at this level. A wire was then able to replace up to level kidney without difficulty. The wire was snapped in place. At this time, the decision was made to proceed with diagnostic ureteroscopy to rule out an intraluminal obstructing tumor. A 4.5 French semirigid ureteroscope was then advanced up to this level which showed some mild debris within the distal ureter and hypovascular mucosa consistent with radiation change with transition and proximal dilation above the level of the radiation stricture. There is no intraluminal papillary tumor appreciated. Given the presence of high-grade obstruction on that side in her advanced kidney disease, the decision was made to go ahead and place a right ureteral stent on this side. A 6 x 24 French double-J ureteral stent, Bard Optima was advanced over the wire up to level of the renal pelvis. Wire was partially drawn  until full coil was noted within the renal pelvis. The wire was then fully withdrawn and a full coil was noted within the kidney.  Attention was then turned to the left UO where a ureteral stent was seen emanating. The distal coil was grasped and brought to level of the urethral meatus. The wire was then advanced up to level of the kidney without difficulty and the wire was removed. There was minimal encrustation appreciated. The wire was then snapped in place. An open-ended ureteral catheter was then advanced alongside the wire just within the distal ureter and a retrograde pyelogram was performed on the side. This revealed no filling defects or hydroureteronephrosis on this side. The ureter did drain fairly quickly on delayed images. It took quite some time for the upper tract drained. As such, in order to continue to optimize renal function, the decision was made to replace a stent on the side. A 6 x 24 French double-J ureteral stent was advanced up to level of the kidney under fluoroscopic guidance. The wire was partially drawn until full coil was noted within the renal pelvis. The wire was then fully withdrawn and a full coil was noted within the bladder. The bladder was then drained.  The patient was then cleaned and dried, repositioned the supine position, reversed from anesthesia, taken to the PACU in stable condition.  Plan: Patient will follow-up in 2 weeks for BMP to assess for possible improvement of her renal function in the setting of maximal upper tract drainage. She'll follow up in our office in 3 months for cystoscopy.  Hollice Espy, M.D.

## 2016-10-12 NOTE — Discharge Instructions (Signed)
You have a ureteral stent in place.  This is a tube that extends from your kidney to your bladder.  This may cause urinary bleeding, burning with urination, and urinary frequency.  Please call our office or present to the ED if you develop fevers >101 or pain which is not able to be controlled with oral pain medications.  You may be given either Flomax and/ or ditropan to help with bladder spasms and stent pain in addition to pain medications.    [facility] [address] [place], [place] [address] [phone]    AMBULATORY SURGERY  DISCHARGE INSTRUCTIONS   1) The drugs that you were given will stay in your system until tomorrow so for the next 24 hours you should not:  A) Drive an automobile B) Make any legal decisions C) Drink any alcoholic beverage   2) You may resume regular meals tomorrow.  Today it is better to start with liquids and gradually work up to solid foods.  You may eat anything you prefer, but it is better to start with liquids, then soup and crackers, and gradually work up to solid foods.   3) Please notify your doctor immediately if you have any unusual bleeding, trouble breathing, redness and pain at the surgery site, drainage, fever, or pain not relieved by medication.    4) Additional Instructions:        Please contact your physician with any problems or Same Day Surgery at [phone], Monday through Friday 6 am to 4 pm, or [place] at [facility] number at [phone].

## 2016-10-12 NOTE — Anesthesia Postprocedure Evaluation (Signed)
Anesthesia Post Note  Patient: Barbara [name]  Procedure(s) Performed: Procedure(s) (LRB): CYSTOSCOPY WITH STENT REPLACEMENT (Left) CYSTOSCOPY WITH RETROGRADE PYELOGRAM (Bilateral) URETEROSCOPY (Right) CYSTOSCOPY WITH STENT PLACEMENT (Right)  Patient location during evaluation: PACU Anesthesia Type: General Level of consciousness: awake and alert Pain management: pain level controlled Vital Signs Assessment: post-procedure vital signs reviewed and stable Respiratory status: spontaneous breathing, nonlabored ventilation, respiratory function stable and patient connected to nasal cannula oxygen Cardiovascular status: blood pressure returned to baseline and stable Postop Assessment: no signs of nausea or vomiting Anesthetic complications: no     Last Vitals:  Vitals:   10/12/16 1006 10/12/16 1039  BP: 135/62 137/68  Pulse: 66 72  Resp: 18 18  Temp: (!) 36 C     Last Pain:  Vitals:   10/12/16 1039  TempSrc:   PainSc: 0-No pain                 Martha Clan

## 2016-10-12 NOTE — Anesthesia Preprocedure Evaluation (Signed)
Anesthesia Evaluation  Patient identified by MRN, date of birth, ID band Patient awake    Reviewed: Allergy & Precautions, NPO status , Patient's Chart, lab work & pertinent test results  History of Anesthesia Complications Negative for: history of anesthetic complications  Airway Mallampati: III       Dental  (+) Missing, Poor Dentition, Dental Advidsory Given   Pulmonary shortness of breath and with exertion, asthma , sleep apnea and Continuous Positive Airway Pressure Ventilation , COPD,  COPD inhaler, neg recent URI, former smoker,     + decreased breath sounds      Cardiovascular Exercise Tolerance: Poor hypertension, (-) angina+ CAD, + Peripheral Vascular Disease and + DOE  (-) Past MI, (-) Cardiac Stents and (-) CABG (-) dysrhythmias (-) Valvular Problems/Murmurs Rhythm:Regular     Neuro/Psych Seizures -,  negative psych ROS   GI/Hepatic Neg liver ROS, GERD  Medicated,  Endo/Other  diabetes, Type 2, Oral Hypoglycemic Agents  Renal/GU CRFRenal disease     Musculoskeletal   Abdominal (+) + obese,   Peds  Hematology  (+) anemia ,   Anesthesia Other Findings Past Medical History: 2017: Anemia associated with chronic renal failure     Comment: blood transfusion last week 10/17 No date: Arthritis 2017: Cancer ([place])     Comment: bladder No date: Chronic kidney disease No date: CKD (chronic kidney disease)     Comment: stage IV kidney disease.  dr. Candiss Norse and dr.               Holley Raring follow her No date: COPD (chronic obstructive pulmonary disease) (* No date: Diabetes mellitus without complication ([place]) No date: Dyspnea     Comment: with exertion No date: GERD (gastroesophageal reflux disease) No date: Hypertension No date: Sleep apnea     Comment: uses cpap 01/2016: Urinary obstruction   Reproductive/Obstetrics negative OB ROS                             Anesthesia  Physical  Anesthesia Plan  ASA: III  Anesthesia Plan: General   Post-op Pain Management:    Induction: Intravenous  Airway Management Planned: Oral ETT  Additional Equipment:   Intra-op Plan:   Post-operative Plan: Extubation in OR  Informed Consent: I have reviewed the patients History and Physical, chart, labs and discussed the procedure including the risks, benefits and alternatives for the proposed anesthesia with the patient or authorized representative who has indicated his/her understanding and acceptance.   Dental Advisory Given  Plan Discussed with: CRNA, Anesthesiologist and Surgeon  Anesthesia Plan Comments:         Anesthesia Quick Evaluation

## 2016-10-13 ENCOUNTER — Encounter: Payer: Self-pay | Admitting: Urology

## 2016-10-14 ENCOUNTER — Other Ambulatory Visit: Payer: Self-pay | Admitting: *Deleted

## 2016-10-14 ENCOUNTER — Other Ambulatory Visit: Payer: Medicare Other

## 2016-10-14 DIAGNOSIS — N189 Chronic kidney disease, unspecified: Principal | ICD-10-CM

## 2016-10-14 DIAGNOSIS — D631 Anemia in chronic kidney disease: Secondary | ICD-10-CM

## 2016-10-15 ENCOUNTER — Inpatient Hospital Stay: Payer: Medicare Other

## 2016-10-15 ENCOUNTER — Inpatient Hospital Stay: Payer: Medicare Other | Admitting: Internal Medicine

## 2016-10-15 ENCOUNTER — Encounter: Payer: Self-pay | Admitting: *Deleted

## 2016-10-15 NOTE — Progress Notes (Signed)
Pt was a No show today with Dr. Rogue Bussing. Reviewed chart. Pt has a cystoscopy/ out patient procedure this week. Will have cancer ctr schedulers r/s lab/md/ possible blood transfusion apt.

## 2016-10-26 ENCOUNTER — Telehealth: Payer: Self-pay

## 2016-10-26 NOTE — Telephone Encounter (Signed)
She has bilateral ureteral stents which will now be chronic.  During surgery itself, there was no cutting or any other intervention and that should cause pain. Unfortunately I don't think chronic narcotics are reasonable in this situation. As such, I did recommend continuation of Mybetriq for bladder spasms, possibly increasing dose to 50 mg.  Hollice Espy, MD

## 2016-10-26 NOTE — Telephone Encounter (Signed)
Pt called stating she is still in a lot of pain post surgery in her lower abd area. Pt requested a refill of pain medication. Please advise.

## 2016-10-27 ENCOUNTER — Other Ambulatory Visit: Payer: Self-pay

## 2016-10-27 DIAGNOSIS — C679 Malignant neoplasm of bladder, unspecified: Secondary | ICD-10-CM

## 2016-10-27 NOTE — Telephone Encounter (Signed)
Spoke with pt in reference to pain medication post op. Made pt aware Dr. Erlene Quan does not want to fill pain medication but recommends continuing myrbetriq and increasing the dosage. Pt voiced understanding. Samples of myrbetriq are left up front.

## 2016-10-28 ENCOUNTER — Other Ambulatory Visit: Payer: Medicare Other

## 2016-10-28 ENCOUNTER — Ambulatory Visit: Payer: Medicare Other | Admitting: Internal Medicine

## 2016-10-28 ENCOUNTER — Encounter: Payer: Self-pay | Admitting: Urology

## 2016-11-04 ENCOUNTER — Telehealth: Payer: Self-pay | Admitting: Urology

## 2016-11-04 ENCOUNTER — Telehealth: Payer: Self-pay

## 2016-11-04 ENCOUNTER — Inpatient Hospital Stay: Payer: Medicare Other | Attending: Internal Medicine | Admitting: Internal Medicine

## 2016-11-04 ENCOUNTER — Inpatient Hospital Stay: Payer: Medicare Other

## 2016-11-04 ENCOUNTER — Other Ambulatory Visit: Payer: Medicare Other

## 2016-11-04 ENCOUNTER — Other Ambulatory Visit: Payer: Self-pay | Admitting: *Deleted

## 2016-11-04 VITALS — BP 123/69 | HR 86 | Temp 97.0°F | Resp 18 | Ht 68.0 in | Wt 152.0 lb

## 2016-11-04 DIAGNOSIS — G4730 Sleep apnea, unspecified: Secondary | ICD-10-CM | POA: Diagnosis not present

## 2016-11-04 DIAGNOSIS — Z87891 Personal history of nicotine dependence: Secondary | ICD-10-CM | POA: Insufficient documentation

## 2016-11-04 DIAGNOSIS — D631 Anemia in chronic kidney disease: Secondary | ICD-10-CM

## 2016-11-04 DIAGNOSIS — N179 Acute kidney failure, unspecified: Secondary | ICD-10-CM

## 2016-11-04 DIAGNOSIS — E1122 Type 2 diabetes mellitus with diabetic chronic kidney disease: Secondary | ICD-10-CM | POA: Insufficient documentation

## 2016-11-04 DIAGNOSIS — N189 Chronic kidney disease, unspecified: Secondary | ICD-10-CM

## 2016-11-04 DIAGNOSIS — Z9981 Dependence on supplemental oxygen: Secondary | ICD-10-CM | POA: Diagnosis not present

## 2016-11-04 DIAGNOSIS — N184 Chronic kidney disease, stage 4 (severe): Principal | ICD-10-CM

## 2016-11-04 DIAGNOSIS — Z79899 Other long term (current) drug therapy: Secondary | ICD-10-CM | POA: Diagnosis not present

## 2016-11-04 DIAGNOSIS — Z7984 Long term (current) use of oral hypoglycemic drugs: Secondary | ICD-10-CM | POA: Insufficient documentation

## 2016-11-04 DIAGNOSIS — C670 Malignant neoplasm of trigone of bladder: Secondary | ICD-10-CM | POA: Insufficient documentation

## 2016-11-04 DIAGNOSIS — Z95828 Presence of other vascular implants and grafts: Secondary | ICD-10-CM

## 2016-11-04 DIAGNOSIS — C679 Malignant neoplasm of bladder, unspecified: Secondary | ICD-10-CM

## 2016-11-04 DIAGNOSIS — D638 Anemia in other chronic diseases classified elsewhere: Secondary | ICD-10-CM

## 2016-11-04 DIAGNOSIS — K219 Gastro-esophageal reflux disease without esophagitis: Secondary | ICD-10-CM | POA: Insufficient documentation

## 2016-11-04 DIAGNOSIS — I129 Hypertensive chronic kidney disease with stage 1 through stage 4 chronic kidney disease, or unspecified chronic kidney disease: Secondary | ICD-10-CM | POA: Diagnosis not present

## 2016-11-04 DIAGNOSIS — J449 Chronic obstructive pulmonary disease, unspecified: Secondary | ICD-10-CM | POA: Diagnosis not present

## 2016-11-04 LAB — COMPREHENSIVE METABOLIC PANEL
ALK PHOS: 70 U/L (ref 38–126)
ALT: 11 U/L — AB (ref 14–54)
ANION GAP: 8 (ref 5–15)
AST: 13 U/L — ABNORMAL LOW (ref 15–41)
Albumin: 3.2 g/dL — ABNORMAL LOW (ref 3.5–5.0)
BILIRUBIN TOTAL: 0.3 mg/dL (ref 0.3–1.2)
BUN: 80 mg/dL — AB (ref 6–20)
CALCIUM: 8.5 mg/dL — AB (ref 8.9–10.3)
CO2: 12 mmol/L — ABNORMAL LOW (ref 22–32)
Chloride: 115 mmol/L — ABNORMAL HIGH (ref 101–111)
Creatinine, Ser: 5.68 mg/dL — ABNORMAL HIGH (ref 0.44–1.00)
GFR calc non Af Amer: 6 mL/min — ABNORMAL LOW (ref >60)
GFR, EST AFRICAN AMERICAN: 8 mL/min — AB (ref >60)
GLUCOSE: 101 mg/dL — AB (ref 65–99)
Potassium: 5 mmol/L (ref 3.5–5.1)
SODIUM: 135 mmol/L (ref 135–145)
TOTAL PROTEIN: 8.7 g/dL — AB (ref 6.5–8.1)

## 2016-11-04 LAB — CBC WITH DIFFERENTIAL/PLATELET
Basophils Absolute: 0.1 10*3/uL (ref 0–0.1)
Basophils Relative: 1 %
Eosinophils Absolute: 0.2 10*3/uL (ref 0–0.7)
Eosinophils Relative: 2 %
HEMATOCRIT: 23.8 % — AB (ref 35.0–47.0)
HEMOGLOBIN: 7.7 g/dL — AB (ref 12.0–16.0)
LYMPHS ABS: 1.2 10*3/uL (ref 1.0–3.6)
Lymphocytes Relative: 17 %
MCH: 29.9 pg (ref 26.0–34.0)
MCHC: 32.4 g/dL (ref 32.0–36.0)
MCV: 92.3 fL (ref 80.0–100.0)
MONOS PCT: 7 %
Monocytes Absolute: 0.5 10*3/uL (ref 0.2–0.9)
NEUTROS ABS: 5.3 10*3/uL (ref 1.4–6.5)
NEUTROS PCT: 73 %
Platelets: 246 10*3/uL (ref 150–440)
RBC: 2.58 MIL/uL — AB (ref 3.80–5.20)
RDW: 15.6 % — ABNORMAL HIGH (ref 11.5–14.5)
WBC: 7.2 10*3/uL (ref 3.6–11.0)

## 2016-11-04 LAB — PREPARE RBC (CROSSMATCH)

## 2016-11-04 LAB — FERRITIN: Ferritin: 566 ng/mL — ABNORMAL HIGH (ref 11–307)

## 2016-11-04 LAB — SAMPLE TO BLOOD BANK

## 2016-11-04 LAB — IRON AND TIBC
IRON: 54 ug/dL (ref 28–170)
Saturation Ratios: 28 % (ref 10.4–31.8)
TIBC: 191 ug/dL — ABNORMAL LOW (ref 250–450)
UIBC: 137 ug/dL

## 2016-11-04 MED ORDER — SODIUM CHLORIDE 0.9% FLUSH
10.0000 mL | INTRAVENOUS | Status: DC | PRN
  Administered 2016-11-04: 10 mL via INTRAVENOUS
  Filled 2016-11-04: qty 10

## 2016-11-04 MED ORDER — HEPARIN SOD (PORK) LOCK FLUSH 100 UNIT/ML IV SOLN
500.0000 [IU] | Freq: Once | INTRAVENOUS | Status: AC
  Administered 2016-11-04: 500 [IU] via INTRAVENOUS

## 2016-11-04 NOTE — Progress Notes (Signed)
Patient to receive 1 unit of blood on Friday 11/06/16

## 2016-11-04 NOTE — Progress Notes (Signed)
[facility] OFFICE PROGRESS NOTE  Patient Care Team: Madelyn Brunner, MD as PCP - General (Internal Medicine)  Cancer Staging No matching staging information was found for the patient.   Oncology History    #AUG 2017 TURBT- [Dr.Brandon] Large nodular proximally 5 cm bladder mass involving the left hemitrigone extending up the left lateral bladder wall, 2 discrete nodules with bridge of abnormal tissue; Tumor clinically T2. TURBT- Tumor invades muscularis propria [detrusor muscle]. NOT candidate for cystectomy.  # SEP 2017- RT- GEM weekly.  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  female pleasant patient above history of  muscle invasive bladder cancer [not a candidate for cystectomy] -s/p definitive radiation with weekly gemcitabine [discontinued gem post 2 treatments-finished December 2017] is here for follow-up.  In the interim patient was evaluated by urology with a cystoscopy; no obvious tumor was noted.  Patient completes of fatigue; shortness of breath with exertion. Denies any blood in stools or black stools. No falls. She is walking herself. She is not in a wheelchair. No admissions to  hospital. She is not on dialysis. She complains of pain in the lower  Abdomen "bladder spasms".   REVIEW OF SYSTEMS:  A complete 10 point review of system is done which is negative except mentioned above/history of present illness.   PAST MEDICAL HISTORY :  Past Medical History:  Diagnosis Date  . Anemia associated with chronic renal failure 2017   blood transfusion last week 10/17  . Arthritis   . Cancer ([place]) 2017   bladder  . Chronic kidney disease   . 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  dr. Candiss Norse and dr. Holley Raring follow her  . COPD (chronic obstructive  pulmonary disease) ([place])   . Diabetes mellitus without complication ([place])   . Dyspnea    with exertion  . Elevated lipids   . GERD (gastroesophageal reflux disease)   . Hematuria   . Hypertension   . Lower back pain   . Oxygen dependent    at hs  . Personal history of chemotherapy   . Sleep apnea    uses cpap  . Urinary obstruction 01/2016    PAST SURGICAL HISTORY :   Past Surgical History:  Procedure Laterality Date  . ABDOMINAL HYSTERECTOMY    . CYSTOSCOPY W/ RETROGRADES Bilateral 02/17/2016   Procedure: CYSTOSCOPY WITH RETROGRADE PYELOGRAM;  Surgeon: Hollice Espy, MD;  Location: [facility];  Service: Urology;  Laterality: Bilateral;  . CYSTOSCOPY W/ RETROGRADES Bilateral 10/12/2016   Procedure: CYSTOSCOPY WITH RETROGRADE PYELOGRAM;  Surgeon: Hollice Espy, MD;  Location: [facility];  Service: Urology;  Laterality: Bilateral;  . CYSTOSCOPY W/ URETERAL STENT PLACEMENT Left 05/12/2016   Procedure: CYSTOSCOPY WITH STENT REPLACEMENT;  Surgeon: Hollice Espy, MD;  Location: [facility];  Service: Urology;  Laterality: Left;  . CYSTOSCOPY W/ URETERAL STENT PLACEMENT Left 10/12/2016   Procedure: CYSTOSCOPY WITH STENT REPLACEMENT;  Surgeon: Hollice Espy, MD;  Location: [facility];  Service: Urology;  Laterality: Left;  . CYSTOSCOPY WITH STENT PLACEMENT Left 01/21/2016   Procedure: CYSTOSCOPY WITH double J STENT PLACEMENT;  Surgeon: Franchot Gallo, MD;  Location: [facility];  Service: Urology;  Laterality: Left;  . CYSTOSCOPY WITH STENT  PLACEMENT Right 10/12/2016   Procedure: CYSTOSCOPY WITH STENT PLACEMENT;  Surgeon: Hollice Espy, MD;  Location: [facility];  Service: Urology;  Laterality: Right;  . KIDNEY SURGERY  01/21/2016   IR NEPHROSTOMY PLACEMENT LEFT   . PERIPHERAL VASCULAR CATHETERIZATION N/A 04/07/2016   Procedure: Glori Luis Cath Insertion;  Surgeon: Katha Cabal, MD;  Location: [facility];  Service: Cardiovascular;  Laterality: N/A;  . ROTATOR CUFF REPAIR     both sides  .  TRANSURETHRAL RESECTION OF BLADDER TUMOR N/A 02/17/2016   Procedure: TRANSURETHRAL RESECTION OF BLADDER TUMOR (TURBT)-LARGE;  Surgeon: Hollice Espy, MD;  Location: [facility];  Service: Urology;  Laterality: N/A;  . URETEROSCOPY Left 02/17/2016   Procedure: URETEROSCOPY;  Surgeon: Hollice Espy, MD;  Location: [facility];  Service: Urology;  Laterality: Left;  . URETEROSCOPY Right 10/12/2016   Procedure: URETEROSCOPY;  Surgeon: Hollice Espy, MD;  Location: [facility];  Service: Urology;  Laterality: Right;    FAMILY HISTORY :   Family History  Problem Relation Age of Onset  . Diabetes Mother   . Cancer Father   . Breast cancer Neg Hx   . Kidney disease Neg Hx     SOCIAL HISTORY:   Social History  Substance Use Topics  . Smoking status: Former Smoker    Types: Cigarettes    Quit date: 07/20/2002  . Smokeless tobacco: Never Used     Comment: 01/22/2016   "  quit smoking many years ago "  . Alcohol use No    ALLERGIES:  is allergic to percocet [oxycodone-acetaminophen].  MEDICATIONS:  Current Outpatient Prescriptions  Medication Sig Dispense Refill  . acetaminophen (TYLENOL) 650 MG CR tablet Take 650 mg by mouth every 8 (eight) hours as needed for pain.    Marland Kitchen atorvastatin (LIPITOR) 40 MG tablet Take 40 mg by mouth every evening.     . cholecalciferol (VITAMIN D) 1000 units tablet Take 1,000 Units by mouth daily.    Marland Kitchen esomeprazole (NEXIUM) 40 MG capsule Take 40 mg by mouth daily at 12 noon.    . ferrous sulfate 325 (65 FE) MG EC tablet Take 325 mg by mouth daily.    Marland Kitchen gabapentin (NEURONTIN) 300 MG capsule Take 600 mg by mouth 3 (three) times daily.    Marland Kitchen MYRBETRIQ 25 MG TB24 tablet TAKE 1 TABLET (25 MG TOTAL) BY MOUTH DAILY. 30 tablet 5  . NON FORMULARY Place 1 Units into the nose at bedtime. CPAP Time of use 2100-0600    . oxybutynin (DITROPAN) 5 MG tablet Take 5 mg by mouth 3 (three) times daily.    . OXYGEN Inhale 2 L into the lungs at bedtime.    . sodium bicarbonate 650 MG tablet  Take 650 mg by mouth 2 (two) times daily.    . TRAVATAN Z 0.004 % SOLN ophthalmic solution PLACE 1 DROP INTO BOTH EYES EVERY NIGHT AT BEDTIME  5  . acetaminophen (TYLENOL) 325 MG tablet Take 2 tablets (650 mg total) by mouth every 6 (six) hours as needed for mild pain. (Patient not taking: Reported on 09/30/2016)    . acetaminophen (TYLENOL) 500 MG chewable tablet Chew 500 mg by mouth every 6 (six) hours as needed for pain.    . Albuterol Sulfate (PROAIR RESPICLICK) 347 (90 Base) MCG/ACT AEPB Inhale 2 puffs into the lungs 4 (four) times daily as needed (wheezing).     . ciprofloxacin (CIPRO) 250 MG tablet Take 1 tablet (250 mg total) by mouth 2 (two) times daily.  Start 3 days prior to surgery (Patient not taking: Reported on 10/12/2016) 6 tablet 0  . docusate sodium (COLACE) 100 MG capsule Take 1 capsule (100 mg total) by mouth 2 (two) times daily as needed for mild constipation. (Patient not taking: Reported on 09/30/2016) 10 capsule 0  . glipiZIDE (GLUCOTROL XL) 5 MG 24 hr tablet TAKE 1 TABLET (5 MG TOTAL) BY MOUTH ONCE DAILY.  5  . lidocaine (XYLOCAINE) 5 % ointment Apply 1 application topically daily as needed for mild pain.      No current facility-administered medications for this visit.     PHYSICAL EXAMINATION: ECOG PERFORMANCE STATUS: 2 - Symptomatic, <50% confined to bed  BP 123/69 (BP Location: Left Arm, Patient Position: Sitting)   Pulse 86   Temp 97 F (36.1 C) (Tympanic)   Resp 18   Ht 5' 8"  (1.727 m)   Wt 152 lb (68.9 kg)   BMI 23.11 kg/m   Filed Weights   11/04/16 1402  Weight: 152 lb (68.9 kg)    GENERAL: Well-nourished well-developed; Alert, no distress and comfortable.  With her husband. She is walking by herself. Obese. EYES: no pallor or icterus OROPHARYNX: no thrush or ulceration; good dentition  NECK: supple, no masses felt LYMPH:  no palpable lymphadenopathy in the cervical, axillary or inguinal regions LUNGS: clear to auscultation and  No wheeze or  crackles HEART/CVS: regular rate & rhythm and no murmurs; No lower extremity edema ABDOMEN:abdomen soft, non-tender and normal bowel sounds Musculoskeletal:no cyanosis of digits and no clubbing  PSYCH: alert & oriented x 3 with fluent speech NEURO: no focal motor/sensory deficits SKIN:  no rashes or significant lesions  LABORATORY DATA:  I have reviewed the data as listed    Component Value Date/Time   NA 135 11/04/2016 1339   NA 138 08/07/2012 0433   K 5.0 11/04/2016 1339   K 3.2 (L) 10/30/2014 1410   CL 115 (H) 11/04/2016 1339   CL 104 08/07/2012 0433   CO2 12 (L) 11/04/2016 1339   CO2 25 08/07/2012 0433   GLUCOSE 101 (H) 11/04/2016 1339   GLUCOSE 172 (H) 08/07/2012 0433   BUN 80 (H) 11/04/2016 1339   BUN 63 (H) 08/07/2012 0433   CREATININE 5.68 (H) 11/04/2016 1339   CREATININE 2.48 (H) 08/07/2012 0433   CALCIUM 8.5 (L) 11/04/2016 1339   CALCIUM 9.1 08/07/2012 0433   PROT 8.7 (H) 11/04/2016 1339   PROT 8.9 (H) 08/02/2012 0958   ALBUMIN 3.2 (L) 11/04/2016 1339   ALBUMIN 3.9 08/02/2012 0958   AST 13 (L) 11/04/2016 1339   AST 21 08/02/2012 0958   ALT 11 (L) 11/04/2016 1339   ALT 19 08/02/2012 0958   ALKPHOS 70 11/04/2016 1339   ALKPHOS 107 08/02/2012 0958   BILITOT 0.3 11/04/2016 1339   BILITOT 0.3 08/02/2012 0958   GFRNONAA 6 (L) 11/04/2016 1339   GFRNONAA 19 (L) 08/07/2012 0433   GFRAA 8 (L) 11/04/2016 1339   GFRAA 22 (L) 08/07/2012 0433    No results found for: SPEP, UPEP  Lab Results  Component Value Date   WBC 7.2 11/04/2016   NEUTROABS 5.3 11/04/2016   HGB 7.7 (L) 11/04/2016   HCT 23.8 (L) 11/04/2016   MCV 92.3 11/04/2016   PLT 246 11/04/2016      Chemistry      Component Value Date/Time   NA 135 11/04/2016 1339   NA 138 08/07/2012 0433   K 5.0 11/04/2016 1339   K 3.2 (L) 10/30/2014 1410  CL 115 (H) 11/04/2016 1339   CL 104 08/07/2012 0433   CO2 12 (L) 11/04/2016 1339   CO2 25 08/07/2012 0433   BUN 80 (H) 11/04/2016 1339   BUN 63 (H)  08/07/2012 0433   CREATININE 5.68 (H) 11/04/2016 1339   CREATININE 2.48 (H) 08/07/2012 0433      Component Value Date/Time   CALCIUM 8.5 (L) 11/04/2016 1339   CALCIUM 9.1 08/07/2012 0433   ALKPHOS 70 11/04/2016 1339   ALKPHOS 107 08/02/2012 0958   AST 13 (L) 11/04/2016 1339   AST 21 08/02/2012 0958   ALT 11 (L) 11/04/2016 1339   ALT 19 08/02/2012 0958   BILITOT 0.3 11/04/2016 1339   BILITOT 0.3 08/02/2012 0958       RADIOGRAPHIC STUDIES: I have personally reviewed the radiological images as listed and agreed with the findings in the report. No results found.   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  s/p RT- and weekly gem x2 [poor tolerance to chemo];finished Dec 2017. Repeat cystoscopy February 2018/limited- no obvious evidence of tumor.  Recommend a PET scan to evaluate for distant disease.  #  Anemia/hemoglobin 7.7; recommend PRBC transfusion.  check Iron studies today; if low recommend IV iron. Question Aranesp in  Context of malignancy.  # Severe stage IV CKD- creatinine- 5.68/ worse from her baseline; not on dialysis; Needs to follow up with Nephrology. Will refer back to Nephrology. Patient appears not actively following up with nephrology.   # Follow-up in 6 weeks/labs./ Hold tube; PET scan prior.    Orders Placed This Encounter  Procedures  . NM PET Image Restag (PS) Skull Base To Thigh    Standing Status:   Future    Standing Expiration Date:   01/04/2018    Order Specific Question:   Reason for Exam (SYMPTOM  OR DIAGNOSIS REQUIRED)    Answer:   bladder cancer    Order Specific Question:   Preferred imaging location?    Answer:   [facility]  . Ferritin    Standing Status:   Future    Number of Occurrences:   1    Standing Expiration Date:   11/04/2017  . Iron and TIBC    Standing Status:   Future    Number of Occurrences:    1    Standing Expiration Date:   11/04/2017  . Ambulatory referral to Nephrology    Referral Priority:   Routine    Referral Type:   Consultation    Referral Reason:   Specialty Services Required    Referred to Provider:   Magnus Sinning, MD    Requested Specialty:   Nephrology    Number of Visits Requested:   1   All questions were answered. The patient knows to call the clinic with any problems, questions or concerns.      Cammie Sickle, MD 11/04/2016 8:06 PM

## 2016-11-04 NOTE — Telephone Encounter (Signed)
Pt came in today for BMP post surgery. At check out pt requested more pain medication. Reinforced with pt Dr. Erlene Quan is not going to provide any more pain medication. Offered pt myrbetriq samples. Pt stated that she had some at home and would just take them. Once again reinforced with pt why Dr. Erlene Quan is not providing pain medication. Pt voiced understanding.

## 2016-11-04 NOTE — Telephone Encounter (Signed)
Pt needs pain pills

## 2016-11-04 NOTE — Assessment & Plan Note (Addendum)
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  s/p RT- and weekly gem x2 [poor tolerance to chemo];finished Dec 2017. Repeat cystoscopy February 2018/limited- no obvious evidence of tumor.  Recommend a PET scan to evaluate for distant disease.  #  Anemia/hemoglobin 7.7; recommend PRBC transfusion.  check Iron studies today; if low recommend IV iron. Question Aranesp in  Context of malignancy.  # Severe stage IV CKD- creatinine- 5.68/ worse from her baseline; not on dialysis; Needs to follow up with Nephrology. Will refer back to Nephrology. Patient appears not actively following up with nephrology.   # Follow-up in 6 weeks/labs./ Hold tube; PET scan prior.

## 2016-11-05 ENCOUNTER — Other Ambulatory Visit: Payer: Medicare Other

## 2016-11-05 LAB — BASIC METABOLIC PANEL
BUN/Creatinine Ratio: 13 (ref 12–28)
BUN: 69 mg/dL — ABNORMAL HIGH (ref 8–27)
CALCIUM: 8.5 mg/dL — AB (ref 8.7–10.3)
CHLORIDE: 108 mmol/L — AB (ref 96–106)
CO2: 10 mmol/L — AB (ref 18–29)
Creatinine, Ser: 5.45 mg/dL — ABNORMAL HIGH (ref 0.57–1.00)
GFR calc non Af Amer: 7 mL/min/{1.73_m2} — ABNORMAL LOW (ref >59)
GFR, EST AFRICAN AMERICAN: 8 mL/min/{1.73_m2} — AB (ref >59)
Glucose: 93 mg/dL (ref 65–99)
POTASSIUM: 5.5 mmol/L — AB (ref 3.5–5.2)
Sodium: 138 mmol/L (ref 134–144)

## 2016-11-06 ENCOUNTER — Inpatient Hospital Stay: Payer: Medicare Other

## 2016-11-06 DIAGNOSIS — C670 Malignant neoplasm of trigone of bladder: Secondary | ICD-10-CM | POA: Diagnosis not present

## 2016-11-06 DIAGNOSIS — D631 Anemia in chronic kidney disease: Principal | ICD-10-CM

## 2016-11-06 DIAGNOSIS — N189 Chronic kidney disease, unspecified: Secondary | ICD-10-CM

## 2016-11-06 MED ORDER — SODIUM CHLORIDE 0.9 % IV SOLN
250.0000 mL | Freq: Once | INTRAVENOUS | Status: AC
  Administered 2016-11-06: 250 mL via INTRAVENOUS
  Filled 2016-11-06: qty 250

## 2016-11-06 MED ORDER — ACETAMINOPHEN 325 MG PO TABS
650.0000 mg | ORAL_TABLET | Freq: Once | ORAL | Status: AC
Start: 2016-11-06 — End: 2016-11-06
  Administered 2016-11-06: 650 mg via ORAL
  Filled 2016-11-06: qty 2

## 2016-11-06 MED ORDER — FUROSEMIDE 10 MG/ML IJ SOLN
20.0000 mg | Freq: Once | INTRAMUSCULAR | Status: AC
  Administered 2016-11-06: 20 mg via INTRAVENOUS
  Filled 2016-11-06: qty 2

## 2016-11-06 MED ORDER — HEPARIN SOD (PORK) LOCK FLUSH 100 UNIT/ML IV SOLN
500.0000 [IU] | Freq: Once | INTRAVENOUS | Status: AC
  Administered 2016-11-06: 500 [IU] via INTRAVENOUS

## 2016-11-06 MED ORDER — DIPHENHYDRAMINE HCL 25 MG PO CAPS
25.0000 mg | ORAL_CAPSULE | Freq: Once | ORAL | Status: AC
  Administered 2016-11-06: 25 mg via ORAL
  Filled 2016-11-06: qty 1

## 2016-11-07 LAB — BPAM RBC
BLOOD PRODUCT EXPIRATION DATE: 201805082359
ISSUE DATE / TIME: 201804200952
UNIT TYPE AND RH: 5100

## 2016-11-07 LAB — TYPE AND SCREEN
ABO/RH(D): O POS
Antibody Screen: NEGATIVE
UNIT DIVISION: 0

## 2016-11-16 ENCOUNTER — Telehealth: Payer: Self-pay | Admitting: Radiology

## 2016-11-16 NOTE — Telephone Encounter (Signed)
Discussed dialysis with the patient. Certainly, if her nephrologist think that she needs to go on dialysis, and recommend proceeding. We also discussed that if she ends up going on dialysis, we can consider removing her ureteral stents which was helping to optimize her renal function. We'll follow-up with her as planned with consideration of stent removal at that time pending her renal status.  Hollice Espy, MD

## 2016-11-16 NOTE — Telephone Encounter (Signed)
Pt states she was referred to nephrology who recommends dialysis but she would like a second opinion. Pt can be reached at [phone]. Please advise.

## 2016-11-18 ENCOUNTER — Encounter (INDEPENDENT_AMBULATORY_CARE_PROVIDER_SITE_OTHER): Payer: Self-pay

## 2016-11-19 ENCOUNTER — Encounter: Payer: Self-pay | Admitting: Internal Medicine

## 2016-11-19 ENCOUNTER — Inpatient Hospital Stay
Admission: AD | Admit: 2016-11-19 | Discharge: 2016-11-23 | DRG: 682 | Disposition: A | Discharge status: Home Health | Payer: Medicare Other | Source: Ambulatory Visit | Attending: Internal Medicine | Admitting: Internal Medicine

## 2016-11-19 ENCOUNTER — Inpatient Hospital Stay: Payer: Medicare Other

## 2016-11-19 DIAGNOSIS — Z992 Dependence on renal dialysis: Secondary | ICD-10-CM | POA: Diagnosis not present

## 2016-11-19 DIAGNOSIS — Z9049 Acquired absence of other specified parts of digestive tract: Secondary | ICD-10-CM

## 2016-11-19 DIAGNOSIS — M545 Low back pain: Secondary | ICD-10-CM | POA: Diagnosis present

## 2016-11-19 DIAGNOSIS — N184 Chronic kidney disease, stage 4 (severe): Secondary | ICD-10-CM

## 2016-11-19 DIAGNOSIS — G4733 Obstructive sleep apnea (adult) (pediatric): Secondary | ICD-10-CM | POA: Diagnosis present

## 2016-11-19 DIAGNOSIS — J449 Chronic obstructive pulmonary disease, unspecified: Secondary | ICD-10-CM | POA: Diagnosis present

## 2016-11-19 DIAGNOSIS — R05 Cough: Secondary | ICD-10-CM

## 2016-11-19 DIAGNOSIS — N186 End stage renal disease: Secondary | ICD-10-CM | POA: Diagnosis present

## 2016-11-19 DIAGNOSIS — Z885 Allergy status to narcotic agent status: Secondary | ICD-10-CM

## 2016-11-19 DIAGNOSIS — Z79899 Other long term (current) drug therapy: Secondary | ICD-10-CM

## 2016-11-19 DIAGNOSIS — N179 Acute kidney failure, unspecified: Secondary | ICD-10-CM | POA: Diagnosis present

## 2016-11-19 DIAGNOSIS — D631 Anemia in chronic kidney disease: Secondary | ICD-10-CM | POA: Diagnosis not present

## 2016-11-19 DIAGNOSIS — Z833 Family history of diabetes mellitus: Secondary | ICD-10-CM | POA: Diagnosis not present

## 2016-11-19 DIAGNOSIS — I120 Hypertensive chronic kidney disease with stage 5 chronic kidney disease or end stage renal disease: Secondary | ICD-10-CM | POA: Diagnosis present

## 2016-11-19 DIAGNOSIS — I129 Hypertensive chronic kidney disease with stage 1 through stage 4 chronic kidney disease, or unspecified chronic kidney disease: Secondary | ICD-10-CM | POA: Diagnosis not present

## 2016-11-19 DIAGNOSIS — E785 Hyperlipidemia, unspecified: Secondary | ICD-10-CM | POA: Diagnosis present

## 2016-11-19 DIAGNOSIS — Z888 Allergy status to other drugs, medicaments and biological substances status: Secondary | ICD-10-CM | POA: Diagnosis not present

## 2016-11-19 DIAGNOSIS — E1140 Type 2 diabetes mellitus with diabetic neuropathy, unspecified: Secondary | ICD-10-CM | POA: Diagnosis present

## 2016-11-19 DIAGNOSIS — K219 Gastro-esophageal reflux disease without esophagitis: Secondary | ICD-10-CM | POA: Diagnosis present

## 2016-11-19 DIAGNOSIS — C670 Malignant neoplasm of trigone of bladder: Secondary | ICD-10-CM

## 2016-11-19 DIAGNOSIS — E1122 Type 2 diabetes mellitus with diabetic chronic kidney disease: Secondary | ICD-10-CM | POA: Diagnosis present

## 2016-11-19 DIAGNOSIS — Z90710 Acquired absence of both cervix and uterus: Secondary | ICD-10-CM

## 2016-11-19 DIAGNOSIS — Z8551 Personal history of malignant neoplasm of bladder: Secondary | ICD-10-CM

## 2016-11-19 DIAGNOSIS — Z87891 Personal history of nicotine dependence: Secondary | ICD-10-CM

## 2016-11-19 DIAGNOSIS — E875 Hyperkalemia: Secondary | ICD-10-CM | POA: Diagnosis present

## 2016-11-19 DIAGNOSIS — E872 Acidosis: Secondary | ICD-10-CM | POA: Diagnosis present

## 2016-11-19 DIAGNOSIS — Z9221 Personal history of antineoplastic chemotherapy: Secondary | ICD-10-CM | POA: Diagnosis not present

## 2016-11-19 DIAGNOSIS — R059 Cough, unspecified: Secondary | ICD-10-CM

## 2016-11-19 DIAGNOSIS — Z9981 Dependence on supplemental oxygen: Secondary | ICD-10-CM

## 2016-11-19 DIAGNOSIS — I219 Acute myocardial infarction, unspecified: Secondary | ICD-10-CM | POA: Diagnosis not present

## 2016-11-19 DIAGNOSIS — Z6822 Body mass index (BMI) 22.0-22.9, adult: Secondary | ICD-10-CM

## 2016-11-19 DIAGNOSIS — N189 Chronic kidney disease, unspecified: Secondary | ICD-10-CM

## 2016-11-19 DIAGNOSIS — E669 Obesity, unspecified: Secondary | ICD-10-CM | POA: Diagnosis present

## 2016-11-19 DIAGNOSIS — N2581 Secondary hyperparathyroidism of renal origin: Secondary | ICD-10-CM | POA: Diagnosis present

## 2016-11-19 DIAGNOSIS — Z923 Personal history of irradiation: Secondary | ICD-10-CM

## 2016-11-19 LAB — BASIC METABOLIC PANEL
Anion gap: 11 (ref 5–15)
BUN: 78 mg/dL — AB (ref 6–20)
CALCIUM: 8.6 mg/dL — AB (ref 8.9–10.3)
CHLORIDE: 111 mmol/L (ref 101–111)
CO2: 13 mmol/L — ABNORMAL LOW (ref 22–32)
CREATININE: 6.99 mg/dL — AB (ref 0.44–1.00)
GFR calc Af Amer: 6 mL/min — ABNORMAL LOW (ref >60)
GFR, EST NON AFRICAN AMERICAN: 5 mL/min — AB (ref >60)
Glucose, Bld: 105 mg/dL — ABNORMAL HIGH (ref 65–99)
Potassium: 4.9 mmol/L (ref 3.5–5.1)
SODIUM: 135 mmol/L (ref 135–145)

## 2016-11-19 LAB — GLUCOSE, CAPILLARY
GLUCOSE-CAPILLARY: 98 mg/dL (ref 65–99)
Glucose-Capillary: 95 mg/dL (ref 65–99)

## 2016-11-19 MED ORDER — LIDOCAINE 5 % EX OINT
1.0000 "application " | TOPICAL_OINTMENT | Freq: Every day | CUTANEOUS | Status: DC | PRN
  Filled 2016-11-19: qty 35.44

## 2016-11-19 MED ORDER — PANTOPRAZOLE SODIUM 40 MG PO TBEC
40.0000 mg | DELAYED_RELEASE_TABLET | Freq: Every day | ORAL | Status: DC
  Administered 2016-11-20 – 2016-11-23 (×4): 40 mg via ORAL
  Filled 2016-11-19 (×4): qty 1

## 2016-11-19 MED ORDER — ALBUTEROL SULFATE (2.5 MG/3ML) 0.083% IN NEBU: 2.5000 mg | INHALATION_SOLUTION | Freq: Four times a day (QID) | RESPIRATORY_TRACT | Status: DC | PRN

## 2016-11-19 MED ORDER — INSULIN ASPART 100 UNIT/ML ~~LOC~~ SOLN
0.0000 [IU] | Freq: Every day | SUBCUTANEOUS | Status: DC
Start: 2016-11-19 — End: 2016-11-23

## 2016-11-19 MED ORDER — FERROUS SULFATE 325 (65 FE) MG PO TABS
325.0000 mg | ORAL_TABLET | Freq: Every day | ORAL | Status: DC
  Administered 2016-11-19 – 2016-11-23 (×5): 325 mg via ORAL
  Filled 2016-11-19 (×5): qty 1

## 2016-11-19 MED ORDER — MIRABEGRON ER 25 MG PO TB24
25.0000 mg | ORAL_TABLET | Freq: Every day | ORAL | Status: DC
  Administered 2016-11-20 – 2016-11-23 (×4): 25 mg via ORAL
  Filled 2016-11-19 (×4): qty 1

## 2016-11-19 MED ORDER — ONDANSETRON HCL 4 MG/2ML IJ SOLN: 4.0000 mg | Freq: Four times a day (QID) | INTRAMUSCULAR | Status: DC | PRN

## 2016-11-19 MED ORDER — ACETAMINOPHEN 650 MG RE SUPP: 650.0000 mg | Freq: Four times a day (QID) | RECTAL | Status: DC | PRN

## 2016-11-19 MED ORDER — LATANOPROST 0.005 % OP SOLN
1.0000 [drp] | Freq: Every day | OPHTHALMIC | Status: DC
  Administered 2016-11-19 – 2016-11-22 (×4): 1 [drp] via OPHTHALMIC
  Filled 2016-11-19: qty 2.5

## 2016-11-19 MED ORDER — GABAPENTIN 100 MG PO CAPS
100.0000 mg | ORAL_CAPSULE | Freq: Three times a day (TID) | ORAL | Status: DC
  Administered 2016-11-19 – 2016-11-23 (×12): 100 mg via ORAL
  Filled 2016-11-19 (×12): qty 1

## 2016-11-19 MED ORDER — INSULIN ASPART 100 UNIT/ML ~~LOC~~ SOLN
0.0000 [IU] | Freq: Three times a day (TID) | SUBCUTANEOUS | Status: DC
  Filled 2016-11-19: qty 1

## 2016-11-19 MED ORDER — ACETAMINOPHEN 325 MG PO TABS
650.0000 mg | ORAL_TABLET | Freq: Four times a day (QID) | ORAL | Status: DC | PRN
  Administered 2016-11-20: 650 mg via ORAL
  Filled 2016-11-19: qty 2

## 2016-11-19 MED ORDER — VITAMIN D 1000 UNITS PO TABS
1000.0000 [IU] | ORAL_TABLET | Freq: Every day | ORAL | Status: DC
  Administered 2016-11-20 – 2016-11-23 (×4): 1000 [IU] via ORAL
  Filled 2016-11-19 (×4): qty 1

## 2016-11-19 MED ORDER — ATORVASTATIN CALCIUM 20 MG PO TABS
40.0000 mg | ORAL_TABLET | Freq: Every evening | ORAL | Status: DC
  Administered 2016-11-19 – 2016-11-23 (×5): 40 mg via ORAL
  Filled 2016-11-19 (×5): qty 2

## 2016-11-19 MED ORDER — ONDANSETRON HCL 4 MG PO TABS: 4.0000 mg | ORAL_TABLET | Freq: Four times a day (QID) | ORAL | Status: DC | PRN

## 2016-11-19 MED ORDER — SODIUM CHLORIDE 0.9 % IV SOLN
INTRAVENOUS | Status: DC
  Administered 2016-11-19 – 2016-11-20 (×2): via INTRAVENOUS

## 2016-11-19 MED ORDER — OXYBUTYNIN CHLORIDE 5 MG PO TABS
5.0000 mg | ORAL_TABLET | Freq: Three times a day (TID) | ORAL | Status: DC
Start: 2016-11-19 — End: 2016-11-23
  Administered 2016-11-19 – 2016-11-23 (×12): 5 mg via ORAL
  Filled 2016-11-19 (×17): qty 1

## 2016-11-19 NOTE — H&P (Signed)
[facility] at [place] NAME: Barbara [name]    MR#:  [id number]  DATE OF BIRTH:  1941/04/03  DATE OF ADMISSION:  11/19/2016  PRIMARY CARE PHYSICIAN: Madelyn Brunner, MD   REQUESTING/REFERRING PHYSICIAN: Dr Murlean Iba  CHIEF COMPLAINT:  Symptoms the hospital to start dialysis  HISTORY OF PRESENT ILLNESS:  Barbara [name]  is a 76 y.o. female with a known history of Chronic kidney disease stage V. Patient on follow-up laboratory testing shows her GFR has gone down to 6 and she's not feeling well. She was directed in for a direct admission for starting dialysis. Patient states that she has a dry mouth. She feels very weak. She does have a cough. She states her appetite is okay. No nausea or vomiting.  PAST MEDICAL HISTORY:   Past Medical History:  Diagnosis Date  . Anemia associated with chronic renal failure 2017   blood transfusion last week 10/17  . Arthritis   . Cancer ([place]) 2017   bladder  . Chronic kidney disease   . 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  dr. Candiss Norse and dr. Holley Raring follow her  . COPD (chronic obstructive pulmonary disease) ([place])   . Diabetes mellitus without complication ([place])   . Dyspnea    with exertion  . Elevated lipids   . GERD (gastroesophageal reflux disease)   . Hematuria   . Hypertension   . Lower back pain   . Oxygen dependent    at hs  . Personal history of chemotherapy   . Sleep apnea    uses cpap  . Urinary obstruction 01/2016    PAST SURGICAL HISTORY:   Past Surgical History:  Procedure Laterality Date  . ABDOMINAL HYSTERECTOMY    . CYSTOSCOPY W/ RETROGRADES Bilateral 02/17/2016   Procedure: CYSTOSCOPY WITH RETROGRADE PYELOGRAM;  Surgeon: Hollice Espy, MD;  Location: [facility];  Service: Urology;  Laterality: Bilateral;  . CYSTOSCOPY W/ RETROGRADES Bilateral 10/12/2016   Procedure: CYSTOSCOPY WITH RETROGRADE PYELOGRAM;  Surgeon: Hollice Espy, MD;  Location: [facility];   Service: Urology;  Laterality: Bilateral;  . CYSTOSCOPY W/ URETERAL STENT PLACEMENT Left 05/12/2016   Procedure: CYSTOSCOPY WITH STENT REPLACEMENT;  Surgeon: Hollice Espy, MD;  Location: [facility];  Service: Urology;  Laterality: Left;  . CYSTOSCOPY W/ URETERAL STENT PLACEMENT Left 10/12/2016   Procedure: CYSTOSCOPY WITH STENT REPLACEMENT;  Surgeon: Hollice Espy, MD;  Location: [facility];  Service: Urology;  Laterality: Left;  . CYSTOSCOPY WITH STENT PLACEMENT Left 01/21/2016   Procedure: CYSTOSCOPY WITH double J STENT PLACEMENT;  Surgeon: Franchot Gallo, MD;  Location: [facility];  Service: Urology;  Laterality: Left;  . CYSTOSCOPY WITH STENT PLACEMENT Right 10/12/2016   Procedure: CYSTOSCOPY WITH STENT PLACEMENT;  Surgeon: Hollice Espy, MD;  Location: [facility];  Service: Urology;  Laterality: Right;  . KIDNEY SURGERY  01/21/2016   IR NEPHROSTOMY PLACEMENT LEFT   . PERIPHERAL VASCULAR CATHETERIZATION N/A 04/07/2016   Procedure: Glori Luis Cath Insertion;  Surgeon: Katha Cabal, MD;  Location: [facility];  Service: Cardiovascular;  Laterality: N/A;  . ROTATOR CUFF REPAIR     both sides  . TRANSURETHRAL RESECTION OF BLADDER TUMOR N/A 02/17/2016   Procedure: TRANSURETHRAL RESECTION OF BLADDER TUMOR (TURBT)-LARGE;  Surgeon: Hollice Espy, MD;  Location: [facility];  Service: Urology;  Laterality: N/A;  . URETEROSCOPY Left 02/17/2016   Procedure: URETEROSCOPY;  Surgeon: Hollice Espy, MD;  Location: [facility];  Service: Urology;  Laterality: Left;  . URETEROSCOPY Right 10/12/2016   Procedure: URETEROSCOPY;  Surgeon: Hollice Espy, MD;  Location: [facility];  Service: Urology;  Laterality: Right;    SOCIAL HISTORY:   Social History  Substance Use Topics  . Smoking status: Former Smoker    Types: Cigarettes    Quit date: 07/20/2002  . Smokeless tobacco: Never Used     Comment: 01/22/2016   "  quit smoking many years ago "  . Alcohol use No    FAMILY HISTORY:   Family History   Problem Relation Age of Onset  . Diabetes Mother   . Cancer Father   . Breast cancer Neg Hx   . Kidney disease Neg Hx     DRUG ALLERGIES:   Allergies  Allergen Reactions  . Glipizide Er     Dropped blood sugar too much  . Percocet [Oxycodone-Acetaminophen] Hives    REVIEW OF SYSTEMS:  CONSTITUTIONAL: No fever. Positive for weakness. Positive for headache EYES: No blurred or double vision. Wears glasses EARS, NOSE, AND THROAT: No tinnitus or ear pain. Positive for runny nose and sore throat. Decreased hearing RESPIRATORY: Positive for cough, no shortness of breath, wheezing or hemoptysis.  CARDIOVASCULAR: No chest pain, orthopnea, edema.  GASTROINTESTINAL: No nausea, vomiting, diarrhea or abdominal pain. No blood in bowel movements. Positive for constipation GENITOURINARY: No dysuria, hematuria.  ENDOCRINE: No polyuria, nocturia,  HEMATOLOGY: No anemia, easy bruising or bleeding SKIN: No rash or lesion. MUSCULOSKELETAL: No joint pain or arthritis.   NEUROLOGIC: No tingling, numbness, weakness.  PSYCHIATRY: No anxiety or depression.   MEDICATIONS AT HOME:   Prior to Admission medications   Medication Sig Start Date End Date Taking? Authorizing Provider  acetaminophen (TYLENOL) 500 MG tablet Take 500 mg by mouth every 6 (six) hours as needed for moderate pain or headache.    Historical Provider, MD  Albuterol Sulfate (PROAIR RESPICLICK) 161 (90 Base) MCG/ACT AEPB Inhale 2 puffs into the lungs 4 (four) times daily as needed (wheezing).     Historical Provider, MD  atorvastatin (LIPITOR) 40 MG tablet Take 40 mg by mouth every evening.     Historical Provider, MD  cholecalciferol (VITAMIN D) 1000 units tablet Take 1,000 Units by mouth daily.    Historical Provider, MD  esomeprazole (NEXIUM) 40 MG capsule Take 40 mg by mouth daily at 12 noon.    Historical Provider, MD  ferrous sulfate 325 (65 FE) MG EC tablet Take 325 mg by mouth daily.    Historical Provider, MD  gabapentin  (NEURONTIN) 300 MG capsule Take 600 mg by mouth 3 (three) times daily.    Historical Provider, MD  lidocaine (XYLOCAINE) 5 % ointment Apply 1 application topically daily as needed for mild pain.  05/21/16   Historical Provider, MD  MYRBETRIQ 25 MG TB24 tablet TAKE 1 TABLET (25 MG TOTAL) BY MOUTH DAILY. 06/10/16   Noreene Filbert, MD  NON FORMULARY Place 1 Units into the nose at bedtime. CPAP Time of use 2100-0600    Historical Provider, MD  oxybutynin (DITROPAN) 5 MG tablet Take 5 mg by mouth 3 (three) times daily.    Historical Provider, MD  OXYGEN Inhale 2 L into the lungs at bedtime.    Historical Provider, MD  sodium bicarbonate 650 MG tablet Take 650 mg by mouth 2 (two) times daily.    Historical Provider, MD  TRAVATAN Z 0.004 % SOLN ophthalmic solution PLACE 1 DROP INTO BOTH EYES EVERY NIGHT AT BEDTIME 07/12/16   Historical Provider,  MD      VITAL SIGNS:  Blood pressure (!) 117/52, pulse 85, temperature 98.5 F (36.9 C), temperature source Oral, resp. rate 18, height 5' 8"  (5.364 m), weight 66.5 kg (146 lb 9.6 oz), SpO2 98 %.  PHYSICAL EXAMINATION:  GENERAL:  76 y.o.-year-old patient lying in the bed with no acute distress.  EYES: Pupils equal, round, reactive to light and accommodation. No scleral icterus. Extraocular muscles intact.  HEENT: Head atraumatic, normocephalic. Oropharynx and nasopharynx clear.  NECK:  Supple, no jugular venous distention. No thyroid enlargement, no tenderness.  LUNGS: Normal breath sounds bilaterally, no wheezing, rales,rhonchi or crepitation. No use of accessory muscles of respiration.  CARDIOVASCULAR: S1, S2 normal. No murmurs, rubs, or gallops.  ABDOMEN: Soft, nontender, nondistended. Bowel sounds present. No organomegaly or mass.  EXTREMITIES: Trace edema, no cyanosis, or clubbing.  NEUROLOGIC: Cranial nerves II through XII are intact. Muscle strength 5/5 in all extremities. Sensation intact. Gait not checked.  PSYCHIATRIC: The patient is alert and  oriented x 3.  SKIN: No rash, lesion, or ulcer.   LABORATORY PANEL:   BMP ordered by me RADIOLOGY:  Chest x-ray ordered by me  EKG:   EKG ordered by me  IMPRESSION AND PLAN:   1. 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 Patient sent in for direct admission for initiation of dialysis. Case discussed with Dr. Candiss Norse the referring nephrologist  and Dr. Rolly Salter the inpatient nephrologist this week. Vascular surgery already contacted by Dr. Rolly Salter. Nothing by mouth after midnight. Check a BMP.  2. Cough could be secondary to allergies obtain a chest x-ray  3. Hyperlipidemia unspecified on Lipitor  4. Neuropathy on gabapentin. I will decrease the dose 200 mg 3 times a day  5. History of bladder cancer and ureteral stents. Patient on oxybutynin and myrbetrec 6. History of anemia of chronic disease on iron 7. History of sleep apnea on oxygen and CPAP at night 8. History of type 2 diabetes mellitus without complication off medications at this point. We'll check fingersticks in Place on sliding scale.   All the records are reviewed and case discussed with ED provider. Management plans discussed with the patient, family and they are in agreement.  CODE STATUS: Full code   TOTAL TIME TAKING CARE OF THIS PATIENT: 50  minutes.    Loletha Grayer M.D on 11/19/2016 at 4:07 PM  Between 7am to 6pm - Pager - [phone]  After 6pm call admission pager [phone]  [facility] Office  [phone]  CC: Primary care physician; Madelyn Brunner, MD

## 2016-11-19 NOTE — Progress Notes (Signed)
Patient a difficult IV stick, patient dehydrated, IV team consulted for a saline lock

## 2016-11-20 ENCOUNTER — Encounter
Admission: AD | Disposition: A | Discharge status: Home Health | Payer: Self-pay | Source: Ambulatory Visit | Attending: Internal Medicine

## 2016-11-20 ENCOUNTER — Inpatient Hospital Stay
Admission: AD | Disposition: A | Discharge status: Home Health | Payer: Self-pay | Source: Ambulatory Visit | Attending: Internal Medicine

## 2016-11-20 DIAGNOSIS — Z87891 Personal history of nicotine dependence: Secondary | ICD-10-CM

## 2016-11-20 DIAGNOSIS — D631 Anemia in chronic kidney disease: Secondary | ICD-10-CM

## 2016-11-20 DIAGNOSIS — C670 Malignant neoplasm of trigone of bladder: Secondary | ICD-10-CM

## 2016-11-20 DIAGNOSIS — Z992 Dependence on renal dialysis: Secondary | ICD-10-CM

## 2016-11-20 DIAGNOSIS — I129 Hypertensive chronic kidney disease with stage 1 through stage 4 chronic kidney disease, or unspecified chronic kidney disease: Secondary | ICD-10-CM

## 2016-11-20 DIAGNOSIS — N184 Chronic kidney disease, stage 4 (severe): Secondary | ICD-10-CM

## 2016-11-20 DIAGNOSIS — Z79899 Other long term (current) drug therapy: Secondary | ICD-10-CM

## 2016-11-20 DIAGNOSIS — N186 End stage renal disease: Secondary | ICD-10-CM

## 2016-11-20 HISTORY — PX: DIALYSIS/PERMA CATHETER INSERTION: CATH118288

## 2016-11-20 LAB — BASIC METABOLIC PANEL
Anion gap: 7 (ref 5–15)
BUN: 80 mg/dL — ABNORMAL HIGH (ref 6–20)
CHLORIDE: 115 mmol/L — AB (ref 101–111)
CO2: 13 mmol/L — AB (ref 22–32)
Calcium: 8.5 mg/dL — ABNORMAL LOW (ref 8.9–10.3)
Creatinine, Ser: 7.02 mg/dL — ABNORMAL HIGH (ref 0.44–1.00)
GFR calc non Af Amer: 5 mL/min — ABNORMAL LOW (ref >60)
GFR, EST AFRICAN AMERICAN: 6 mL/min — AB (ref >60)
Glucose, Bld: 92 mg/dL (ref 65–99)
POTASSIUM: 5.2 mmol/L — AB (ref 3.5–5.1)
SODIUM: 135 mmol/L (ref 135–145)

## 2016-11-20 LAB — GLUCOSE, CAPILLARY
Glucose-Capillary: 85 mg/dL (ref 65–99)
Glucose-Capillary: 83 mg/dL (ref 65–99)
Glucose-Capillary: 83 mg/dL (ref 65–99)
Glucose-Capillary: 98 mg/dL (ref 65–99)

## 2016-11-20 LAB — CBC
HEMATOCRIT: 27.4 % — AB (ref 35.0–47.0)
HEMOGLOBIN: 8.7 g/dL — AB (ref 12.0–16.0)
MCH: 29.5 pg (ref 26.0–34.0)
MCHC: 31.7 g/dL — ABNORMAL LOW (ref 32.0–36.0)
MCV: 93.1 fL (ref 80.0–100.0)
Platelets: 267 10*3/uL (ref 150–440)
RBC: 2.95 MIL/uL — AB (ref 3.80–5.20)
RDW: 15.8 % — ABNORMAL HIGH (ref 11.5–14.5)
WBC: 8.2 10*3/uL (ref 3.6–11.0)

## 2016-11-20 SURGERY — DIALYSIS/PERMA CATHETER INSERTION
Anesthesia: Moderate Sedation

## 2016-11-20 MED ORDER — FENTANYL CITRATE (PF) 100 MCG/2ML IJ SOLN
INTRAMUSCULAR | Status: AC
  Filled 2016-11-20: qty 2

## 2016-11-20 MED ORDER — HEPARIN SOD (PORK) LOCK FLUSH 100 UNIT/ML IV SOLN
500.0000 [IU] | Freq: Every day | INTRAVENOUS | Status: DC | PRN
Start: 2016-11-20 — End: 2016-11-23
  Filled 2016-11-20: qty 5

## 2016-11-20 MED ORDER — TUBERCULIN PPD 5 UNIT/0.1ML ID SOLN
5.0000 [IU] | Freq: Once | INTRADERMAL | Status: AC
  Administered 2016-11-20: 5 [IU] via INTRADERMAL
  Filled 2016-11-20: qty 0.1

## 2016-11-20 MED ORDER — HEPARIN (PORCINE) IN NACL 2-0.9 UNIT/ML-% IJ SOLN
INTRAMUSCULAR | Status: AC
  Filled 2016-11-20: qty 500

## 2016-11-20 MED ORDER — MIDAZOLAM HCL 2 MG/2ML IJ SOLN
INTRAMUSCULAR | Status: AC
  Filled 2016-11-20: qty 2

## 2016-11-20 MED ORDER — HEPARIN SOD (PORK) LOCK FLUSH 100 UNIT/ML IV SOLN
500.0000 [IU] | Freq: Every day | INTRAVENOUS | Status: DC | PRN
  Filled 2016-11-20: qty 5

## 2016-11-20 MED ORDER — LIDOCAINE-EPINEPHRINE (PF) 2 %-1:200000 IJ SOLN
INTRAMUSCULAR | Status: AC
  Filled 2016-11-20: qty 20

## 2016-11-20 MED ORDER — SODIUM CHLORIDE 0.9 % IV SOLN: 250.0000 mL | Freq: Once | INTRAVENOUS | Status: DC

## 2016-11-20 MED ORDER — SODIUM CHLORIDE 0.9% FLUSH: 10.0000 mL | INTRAVENOUS | Status: DC | PRN

## 2016-11-20 MED ORDER — CALCIUM ACETATE (PHOS BINDER) 667 MG PO CAPS
1334.0000 mg | ORAL_CAPSULE | Freq: Three times a day (TID) | ORAL | Status: DC
  Administered 2016-11-21 – 2016-11-23 (×9): 1334 mg via ORAL
  Filled 2016-11-20 (×9): qty 2

## 2016-11-20 MED ORDER — HEPARIN SODIUM (PORCINE) 10000 UNIT/ML IJ SOLN
INTRAMUSCULAR | Status: AC
  Filled 2016-11-20: qty 1

## 2016-11-20 MED ORDER — FENTANYL CITRATE (PF) 100 MCG/2ML IJ SOLN
INTRAMUSCULAR | Status: DC | PRN
  Administered 2016-11-20: 50 ug via INTRAVENOUS

## 2016-11-20 MED ORDER — SODIUM CHLORIDE 0.9% FLUSH: 3.0000 mL | INTRAVENOUS | Status: DC | PRN

## 2016-11-20 MED ORDER — HEPARIN SOD (PORK) LOCK FLUSH 100 UNIT/ML IV SOLN
250.0000 [IU] | INTRAVENOUS | Status: DC | PRN
  Filled 2016-11-20: qty 3

## 2016-11-20 MED ORDER — MIDAZOLAM HCL 2 MG/2ML IJ SOLN
INTRAMUSCULAR | Status: DC | PRN
  Administered 2016-11-20: 2 mg via INTRAVENOUS

## 2016-11-20 SURGICAL SUPPLY — 11 items
CATH PALINDROME RT-P 15FX19CM (CATHETERS) ×3 IMPLANT
DERMABOND ADVANCED (GAUZE/BANDAGES/DRESSINGS) ×2
DERMABOND ADVANCED .7 DNX12 (GAUZE/BANDAGES/DRESSINGS) ×1 IMPLANT
DEVICE INFLAT 30 PLUS (MISCELLANEOUS) IMPLANT
GLIDESHEATH SLEND SS 6F .021 (SHEATH) IMPLANT
GUIDEWIRE 3MM J TIP .035 145 (WIRE) ×3 IMPLANT
NEEDLE PERC 18GX7CM (NEEDLE) ×6 IMPLANT
PACK CARDIAC CATH (CUSTOM PROCEDURE TRAY) ×3 IMPLANT
SUT MNCRL AB 4-0 PS2 18 (SUTURE) ×3 IMPLANT
SUT PROLENE 0 CT 1 30 (SUTURE) ×3 IMPLANT
WIRE ROSEN-J .035X260CM (WIRE) IMPLANT

## 2016-11-20 NOTE — Progress Notes (Signed)
Pt refused CPAP. Pt states that she had a needle placed in her neck today and she has a neck ache. Pt doesn't wish for pain meds. Pt encouraged to wear CPAP and continues to refuse. Pt encouraged to call should she change her mind

## 2016-11-20 NOTE — Progress Notes (Signed)
HD COMPLETED  

## 2016-11-20 NOTE — Progress Notes (Signed)
[place] at [place] NAME: Barbara [name]    MR#:  [id number]  DATE OF BIRTH:  07-08-1941  SUBJECTIVE:  CHIEF COMPLAINT:  No chief complaint on file.  Nausea. Weakness  REVIEW OF SYSTEMS:    Review of Systems  Constitutional: Positive for malaise/fatigue. Negative for chills and fever.  HENT: Negative for sore throat.   Eyes: Negative for blurred vision, double vision and pain.  Respiratory: Negative for cough, hemoptysis, shortness of breath and wheezing.   Cardiovascular: Negative for chest pain, palpitations, orthopnea and leg swelling.  Gastrointestinal: Positive for nausea. Negative for abdominal pain, constipation, diarrhea, heartburn and vomiting.  Genitourinary: Negative for dysuria and hematuria.  Musculoskeletal: Negative for back pain and joint pain.  Skin: Negative for rash.  Neurological: Positive for weakness. Negative for sensory change, speech change, focal weakness and headaches.  Endo/Heme/Allergies: Does not bruise/bleed easily.  Psychiatric/Behavioral: Negative for depression. The patient is not nervous/anxious.     DRUG ALLERGIES:   Allergies  Allergen Reactions  . Glipizide Er     Dropped blood sugar too much  . Percocet [Oxycodone-Acetaminophen] Hives    VITALS:  Blood pressure (!) 126/55, pulse 89, temperature 98.9 F (37.2 C), temperature source Oral, resp. rate 16, height 5' 8"  (1.727 m), weight 66.5 kg (146 lb 9.6 oz), SpO2 97 %.  PHYSICAL EXAMINATION:   Physical Exam  GENERAL:  76 y.o.-year-old patient lying in the bed with no acute distress.  EYES: Pupils equal, round, reactive to light and accommodation. No scleral icterus. Extraocular muscles intact.  HEENT: Head atraumatic, normocephalic. Oropharynx and nasopharynx clear.  NECK:  Supple, no jugular venous distention. No thyroid enlargement, no tenderness.  LUNGS: Normal breath sounds bilaterally, no wheezing, rales, rhonchi. No use of accessory  muscles of respiration.  CARDIOVASCULAR: S1, S2 normal. No murmurs, rubs, or gallops.  ABDOMEN: Soft, nontender, nondistended. Bowel sounds present. No organomegaly or mass.  EXTREMITIES: No cyanosis, clubbing or edema b/l.    NEUROLOGIC: Cranial nerves II through XII are intact. No focal Motor or sensory deficits b/l.   PSYCHIATRIC: The patient is alert and oriented x 3.  SKIN: No obvious rash, lesion, or ulcer.   LABORATORY PANEL:   CBC  Recent Labs Lab 11/20/16 0605  WBC 8.2  HGB 8.7*  HCT 27.4*  PLT 267   ------------------------------------------------------------------------------------------------------------------ Chemistries   Recent Labs Lab 11/20/16 0605  NA 135  K 5.2*  CL 115*  CO2 13*  GLUCOSE 92  BUN 80*  CREATININE 7.02*  CALCIUM 8.5*   ------------------------------------------------------------------------------------------------------------------  Cardiac Enzymes No results for input(s): TROPONINI in the last 168 hours. ------------------------------------------------------------------------------------------------------------------  RADIOLOGY:  Dg Chest Port 1 View  Result Date: 11/19/2016 CLINICAL DATA:  Cough, hypertension, COPD, diabetes mellitus, bladder cancer EXAM: PORTABLE CHEST 1 VIEW COMPARISON:  Portable exam 1709 hours compared to 05/04/2016 FINDINGS: RIGHT jugular Port-A-Cath with tip projecting over SVC. Normal heart size, mediastinal contours, and pulmonary vascularity. Bibasilar atelectasis and decreased basilar lung volumes. Upper lungs clear. No pleural effusion or pneumothorax. Atherosclerotic calcification aorta noted. No acute osseous findings. IMPRESSION: Bibasilar hypoinflation and atelectasis. Aortic atherosclerosis. Electronically Signed   By: Lavonia Dana M.D.   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 Associated hyperkalemia and uremic  symptoms She will get a PermCath later today. Dialysis after that. Appreciate nephrology and vascular help. Repeat labs in the morning  * Neuropathy  on gabapentin  * History of bladder cancer and ureteral stents. Patient on oxybutynin and myrbetrec Follows with Dr. Erlene Quan * nemia of chronic disease on iron. Monitor  * History of sleep apnea on oxygen and CPAP at night  * History of type 2 diabetes mellitus without complication.  off medications at this point. We'll check fingersticks in Place on sliding scale.  All the records are reviewed and case discussed with Care Management/Social Worker Management plans discussed with the patient, family and they are in agreement.  CODE STATUS: FULL CODE  DVT Prophylaxis: SCDs  TOTAL TIME TAKING CARE OF THIS PATIENT: 30 minutes.   POSSIBLE D/C IN 2-3 DAYS, DEPENDING ON CLINICAL CONDITION.  Hillary Bow R M.D on 11/20/2016 at 11:28 AM  Between 7am to 6pm - Pager - [phone]  After 6pm go to [email or web address] - password [email or web address] [facility]  Office  [phone]  CC: Primary care physician; Madelyn Brunner, MD  Note: This dictation was prepared with Dragon dictation along with smaller phrase technology. Any transcriptional errors that result from this process are unintentional.

## 2016-11-20 NOTE — H&P (Signed)
[name] History & Physical Update  The patient was interviewed and re-examined.  The patient's previous History and Physical has been reviewed and is unchanged.  There is no change in the plan of care. We plan to proceed with the scheduled procedure.  Barbara Pain, MD  11/20/2016, 1:36 PM

## 2016-11-20 NOTE — Progress Notes (Signed)
Patient on CPAP overnight, denied pain, assisted as needed. No acute event overnight. NPO overnight for permcath placement, consent signed by patient.

## 2016-11-20 NOTE — Consult Note (Signed)
[place] NOTE  Patient Care Team: Madelyn Brunner, MD as PCP - General (Internal Medicine)  CHIEF COMPLAINTS/PURPOSE OF CONSULTATION:  History of bladder cancer; need for Procrit   HISTORY OF PRESENTING ILLNESS:  please note patient is a fair historian at best. Patient alone.  Barbara [name] 76 y.o.  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 Patient is currently getting her first session of dialysis.    patient is chronically anemic hemoglobin ranging 7-8 secondary to her chronic kidney disease. Hematology has been consulted for further recommendations regarding management of anemia/Procrit in context of her bladder cancer.   With regards to bladder cancer- patient has a muscle invasive bladder cancer that has been treated with chemotherapy radiation- with good response as noted on the last cystoscopy a month or so ago. Patient is awaiting PET scan to evaluate the systemic disease.  ROS: Given her poor history giving skills- unable to assess. Patient denies any chest pain. Denies any diarrhea. No nausea no vomiting.   MEDICAL HISTORY:  Past Medical History:  Diagnosis Date  . Anemia associated with chronic renal failure 2017   blood transfusion last week 10/17  . Arthritis   . Cancer ([place]) 2017   bladder  . Chronic kidney disease   . 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  dr. Candiss Norse and dr. Holley Raring follow her  . COPD (chronic obstructive pulmonary disease) ([place])   . Diabetes mellitus without complication ([place])   . Dyspnea    with exertion  . Elevated lipids   . GERD (gastroesophageal reflux disease)   . Hematuria   . Hypertension   . Lower back pain   . Oxygen dependent    at hs  . Personal history of chemotherapy    . Sleep apnea    uses cpap  . Urinary obstruction 01/2016    SURGICAL HISTORY: Past Surgical History:  Procedure Laterality Date  . ABDOMINAL HYSTERECTOMY    . CYSTOSCOPY W/ RETROGRADES Bilateral 02/17/2016   Procedure: CYSTOSCOPY WITH RETROGRADE PYELOGRAM;  Surgeon: Hollice Espy, MD;  Location: [facility];  Service: Urology;  Laterality: Bilateral;  . CYSTOSCOPY W/ RETROGRADES Bilateral 10/12/2016   Procedure: CYSTOSCOPY WITH RETROGRADE PYELOGRAM;  Surgeon: Hollice Espy, MD;  Location: [facility];  Service: Urology;  Laterality: Bilateral;  . CYSTOSCOPY W/ URETERAL STENT PLACEMENT Left 05/12/2016   Procedure: CYSTOSCOPY WITH STENT REPLACEMENT;  Surgeon: Hollice Espy, MD;  Location: [facility];  Service: Urology;  Laterality: Left;  . CYSTOSCOPY W/ URETERAL STENT PLACEMENT Left 10/12/2016   Procedure: CYSTOSCOPY WITH STENT REPLACEMENT;  Surgeon: Hollice Espy, MD;  Location: [facility];  Service: Urology;  Laterality: Left;  . CYSTOSCOPY WITH STENT PLACEMENT Left 01/21/2016   Procedure: CYSTOSCOPY WITH double J STENT PLACEMENT;  Surgeon: Franchot Gallo, MD;  Location: [facility];  Service: Urology;  Laterality: Left;  . CYSTOSCOPY WITH STENT PLACEMENT Right 10/12/2016   Procedure: CYSTOSCOPY WITH STENT PLACEMENT;  Surgeon: Hollice Espy, MD;  Location: [facility];  Service: Urology;  Laterality: Right;  . KIDNEY SURGERY  01/21/2016   IR NEPHROSTOMY PLACEMENT LEFT   . PERIPHERAL VASCULAR CATHETERIZATION N/A 04/07/2016   Procedure: Glori Luis Cath Insertion;  Surgeon: Katha Cabal, MD;  Location: [facility];  Service: Cardiovascular;  Laterality: N/A;  .  ROTATOR CUFF REPAIR     both sides  . TRANSURETHRAL RESECTION OF BLADDER TUMOR N/A 02/17/2016   Procedure: TRANSURETHRAL RESECTION OF BLADDER TUMOR (TURBT)-LARGE;  Surgeon: Hollice Espy, MD;  Location: [facility];  Service: Urology;  Laterality: N/A;  . URETEROSCOPY Left 02/17/2016   Procedure: URETEROSCOPY;  Surgeon: Hollice Espy, MD;   Location: [facility];  Service: Urology;  Laterality: Left;  . URETEROSCOPY Right 10/12/2016   Procedure: URETEROSCOPY;  Surgeon: Hollice Espy, MD;  Location: [facility];  Service: Urology;  Laterality: Right;    SOCIAL HISTORY: Social History   Social History  . Marital status: Married    Spouse name: N/A  . Number of children: N/A  . Years of education: N/A   Occupational History  . retired    Social History Main Topics  . Smoking status: Former Smoker    Types: Cigarettes    Quit date: 07/20/2002  . Smokeless tobacco: Never Used     Comment: 01/22/2016   "  quit smoking many years ago "  . Alcohol use No  . Drug use: No  . Sexual activity: No   Other Topics Concern  . Not on file   Social History Narrative  . No narrative on file    FAMILY HISTORY: Family History  Problem Relation Age of Onset  . Diabetes Mother   . Cancer Father   . Breast cancer Neg Hx   . Kidney disease Neg Hx     ALLERGIES:  is allergic to glipizide er and percocet [oxycodone-acetaminophen].  MEDICATIONS:  Current Facility-Administered Medications  Medication Dose Route Frequency Provider Last Rate Last Dose  . 0.9 %  sodium chloride infusion  250 mL Intravenous Once Cammie Sickle, MD      . acetaminophen (TYLENOL) tablet 650 mg  650 mg Oral Q6H PRN Loletha Grayer, MD       Or  . acetaminophen (TYLENOL) suppository 650 mg  650 mg Rectal Q6H PRN Loletha Grayer, MD      . albuterol (PROVENTIL) (2.5 MG/3ML) 0.083% nebulizer solution 2.5 mg  2.5 mg Inhalation QID PRN Loletha Grayer, MD      . atorvastatin (LIPITOR) tablet 40 mg  40 mg Oral QPM Loletha Grayer, MD   40 mg at 11/19/16 1854  . calcium acetate (PHOSLO) capsule 1,334 mg  1,334 mg Oral TID WC Lavonia Dana, MD      . cholecalciferol (VITAMIN D) tablet 1,000 Units  1,000 Units Oral Daily Loletha Grayer, MD   1,000 Units at 11/20/16 1040  . ferrous sulfate tablet 325 mg  325 mg Oral Daily Loletha Grayer, MD   325 mg at  11/20/16 1040  . gabapentin (NEURONTIN) capsule 100 mg  100 mg Oral TID Loletha Grayer, MD   100 mg at 11/20/16 1040  . heparin lock flush 100 unit/mL  500 Units Intracatheter Daily PRN Cammie Sickle, MD      . heparin lock flush 100 unit/mL  250 Units Intracatheter PRN Cammie Sickle, MD      . heparin lock flush 100 unit/mL  500 Units Intracatheter Daily PRN Cammie Sickle, MD      . insulin aspart (novoLOG) injection 0-5 Units  0-5 Units Subcutaneous QHS Loletha Grayer, MD      . insulin aspart (novoLOG) injection 0-9 Units  0-9 Units Subcutaneous TID WC Loletha Grayer, MD      . latanoprost (XALATAN) 0.005 % ophthalmic solution 1 drop  1 drop Both Eyes QHS Richard McComb,  MD   1 drop at 11/19/16 2121  . lidocaine (XYLOCAINE) 5 % ointment 1 application  1 application Topical Daily PRN Loletha Grayer, MD      . mirabegron ER [facility]) tablet 25 mg  25 mg Oral Daily Loletha Grayer, MD   25 mg at 11/20/16 1040  . ondansetron (ZOFRAN) tablet 4 mg  4 mg Oral Q6H PRN Loletha Grayer, MD       Or  . ondansetron [facility]) injection 4 mg  4 mg Intravenous Q6H PRN Loletha Grayer, MD      . oxybutynin (DITROPAN) tablet 5 mg  5 mg Oral TID Loletha Grayer, MD   5 mg at 11/20/16 1040  . pantoprazole (PROTONIX) EC tablet 40 mg  40 mg Oral Daily Loletha Grayer, MD   40 mg at 11/20/16 1040  . sodium chloride flush (NS) 0.9 % injection 10 mL  10 mL Intracatheter PRN Cammie Sickle, MD      . sodium chloride flush (NS) 0.9 % injection 10 mL  10 mL Intracatheter PRN Cammie Sickle, MD      . sodium chloride flush (NS) 0.9 % injection 3 mL  3 mL Intracatheter PRN Cammie Sickle, MD      . tuberculin injection 5 Units  5 Units Intradermal Once Lavonia Dana, MD          .  PHYSICAL EXAMINATION:  Vitals:   11/20/16 1600 11/20/16 1630  BP: (!) 95/59   Pulse: 95 87  Resp: 18 17  Temp:     Filed Weights   11/19/16 1505 11/20/16 1510  Weight: 146 lb 9.6 oz  (66.5 kg) 147 lb 11.3 oz (67 kg)    GENERAL: Well-nourished well-developed; Alert, no distress and comfoShe is alone. Patient is seen in the dialysis. EYES: Positive for pallor. No icterus.  OROPHARYNX: no thrush or ulceration. NECK: supple, no masses felt LYMPH:  no palpable lymphadenopathy in the cervical, axillary or inguinal regions LUNGS: decreased breath sounds to auscultation at bases and  No wheeze or crackles HEART/CVS: regular rate & rhythm and no murmurs; No lower extremity edema ABDOMEN: abdomen soft, non-tender and normal bowel sounds Musculoskeletal:no cyanosis of digits and no clubbing  PSYCH: alert & oriented x 3 with fluent speech NEURO: no focal motor/sensory deficits SKIN:  ecchymosis noted. Chronic. No bleeding noted. Patient has a dialysis catheter in place.   LABORATORY DATA:  I have reviewed the data as listed Lab Results  Component Value Date   WBC 8.2 11/20/2016   HGB 8.7 (L) 11/20/2016   HCT 27.4 (L) 11/20/2016   MCV 93.1 11/20/2016   PLT 267 11/20/2016    Recent Labs  06/04/16 2006  09/17/16 0957  11/04/16 1339 11/04/16 1453 11/19/16 1544 11/20/16 0605  NA 139  < > 139  < > 135 138 135 135  K 4.5  < > 4.2  < > 5.0 5.5* 4.9 5.2*  CL 113*  < > 112*  < > 115* 108* 111 115*  CO2 18*  < > 21*  < > 12* 10* 13* 13*  GLUCOSE 70  < > 102*  < > 101* 93 105* 92  BUN 62*  < > 45*  < > 80* 69* 78* 80*  CREATININE 4.85*  < > 4.07*  < > 5.68* 5.45* 6.99* 7.02*  CALCIUM 8.1*  < > 8.6*  < > 8.5* 8.5* 8.6* 8.5*  GFRNONAA 8*  < > 10*  < > 6* 7* 5* 5*  GFRAA 9*  < > 11*  < > 8* 8* 6* 6*  PROT 8.1  --  8.4*  --  8.7*  --   --   --   ALBUMIN 2.6*  --  3.2*  --  3.2*  --   --   --   AST 34  --  14*  --  13*  --   --   --   ALT 26  --  10*  --  11*  --   --   --   ALKPHOS 67  --  76  --  70  --   --   --   BILITOT 0.6  --  0.2*  --  0.3  --   --   --   < > = values in this interval not displayed.  RADIOGRAPHIC STUDIES: I have personally reviewed the  radiological images as listed and agreed with the findings in the report. Dg Chest Port 1 View  Result Date: 11/19/2016 CLINICAL DATA:  Cough, hypertension, COPD, diabetes mellitus, bladder cancer EXAM: PORTABLE CHEST 1 VIEW COMPARISON:  Portable exam 1709 hours compared to 05/04/2016 FINDINGS: RIGHT jugular Port-A-Cath with tip projecting over SVC. Normal heart size, mediastinal contours, and pulmonary vascularity. Bibasilar atelectasis and decreased basilar lung volumes. Upper lungs clear. No pleural effusion or pneumothorax. Atherosclerotic calcification aorta noted. No acute osseous findings. IMPRESSION: Bibasilar hypoinflation and atelectasis. Aortic atherosclerosis. Electronically Signed   By: Lavonia Dana M.D.   On: 11/19/2016 17:50    ASSESSMENT & PLAN:   # 76 year old female patient with multiple comorbidities- bladder cancer; chronic kidney disease- is currently admitted to the hospital for worsening renal function/on dialysis  # Severe anemia secondary to chronic kidney disease- needing frequent blood transfusions/symptomatic. Hemoglobin is a day between 7-8. patient's recent iron studies show saturation 28% ferritin greater than 500. Patient will benefit from Procrit to keep the hemoglobin around 10. At a long discussion the patient regarding benefits of Procrit; also the concerns including but not limited to- elevated blood pressure thromboembolic phenomena. Also discussed the small concern for risk of progression of malignancy on Procrit. However given the risk versus benefit- I think it's reasonable to initiate Procrit as per nephrology protocol.  # Bladder cancer muscle invasive status post chemoradiation- most recent cystoscopy negative for any recurrent malignancy. Patient in general is a high risk of recurrence. Patient awaiting a PET scan in May; patient follow-up with me thereafter.  # Multiple comorbidities- end-stage renal disease on dialysis/ COPD CHF.   Thank you Dr. Juleen China  for allowing me to participate in the care of your pleasant patient. Please do not hesitate to contact me with questions or concerns in the interim. I have informed Dr.Kolluru of my recommendations.   All questions were answered. The patient knows to call the clinic with any problems, questions or concerns.    Cammie Sickle, MD 11/20/2016 5:03 PM

## 2016-11-20 NOTE — Progress Notes (Signed)
POST DIALYSIS ASSESSMENT 

## 2016-11-20 NOTE — Progress Notes (Signed)
PRE DIALYSIS ASSESSMENT 

## 2016-11-20 NOTE — Progress Notes (Signed)
HD STARTED  

## 2016-11-20 NOTE — Op Note (Signed)
OPERATIVE NOTE    PRE-OPERATIVE DIAGNOSIS: 1. ESRD   POST-OPERATIVE DIAGNOSIS: same as above  PROCEDURE: 1. Ultrasound guidance for vascular access to the left internal jugular vein 2. Fluoroscopic guidance for placement of catheter 3. Placement of a 23 cm tip to cuff tunneled hemodialysis catheter via the left internal jugular vein  SURGEON: Leotis Pain, MD  ANESTHESIA:  Local with Moderate conscious sedation for approximately 20 minutes using 2 mg of Versed and 50 mcg of Fentanyl  ESTIMATED BLOOD LOSS: 15 cc  FLUORO TIME: less than one minute  CONTRAST: none  FINDING(S): 1.  Patent left internal jugular vein  SPECIMEN(S):  None  INDICATIONS:   Barbara [name] is a 76 y.o. female who presents with progression of renal failure to ESRD.  The patient needs long term dialysis access for their ESRD, and a Permcath is necessary.  Risks and benefits are discussed and informed consent is obtained.    DESCRIPTION: After obtaining full informed written consent, the patient was brought back to the vascular suited. The patient's left neck and chest were sterilely prepped and draped in a sterile surgical field was created. Moderate conscious sedation was administered during a face to face encounter with the patient throughout the procedure with my supervision of the RN administering medicines and monitoring the patient's vital signs, pulse oximetry, telemetry and mental status throughout from the start of the procedure until the patient was taken to the recovery room.  The left internal jugular vein was visualized with ultrasound and found to be patent. It was then accessed under direct ultrasound guidance and a permanent image was recorded. A wire was placed. After skin nick and dilatation, the peel-away sheath was placed over the wire. I then turned my attention to an area under the clavicle. Approximately 1-2 fingerbreadths below the clavicle a small counterincision was created and tunneled from  the subclavicular incision to the access site. Using fluoroscopic guidance, a 23 centimeter tip to cuff tunneled hemodialysis catheter was selected, and tunneled from the subclavicular incision to the access site. It was then placed through the peel-away sheath and the peel-away sheath was removed. Using fluoroscopic guidance the catheter tips were parked in the right atrium. The appropriate distal connectors were placed. It withdrew blood well and flushed easily with heparinized saline and a concentrated heparin solution was then placed. It was secured to the chest wall with 2 Prolene sutures. The access incision was closed single 4-0 Monocryl. A 4-0 Monocryl pursestring suture was placed around the exit site. Sterile dressings were placed. The patient tolerated the procedure well and was taken to the recovery room in stable condition.  COMPLICATIONS: None  CONDITION: Stable  Leotis Pain  11/20/2016, 2:20 PM   This note was created with Dragon Medical transcription system. Any errors in dictation are purely unintentional.

## 2016-11-20 NOTE — Progress Notes (Signed)
[facility]  ROUNDING NOTE   Subjective:   [name] admitted to [facility] on 11/19/2016 for chronic kidney disease renal failure end stage renal disease   Was seen in our office yesterday with uremic symptoms. Found to have hyperkalemia and metabolic acidosis.   Objective:  Vital signs in last 24 hours:  Temp:  [98.2 F (36.8 C)-98.9 F (37.2 C)] 98.9 F (37.2 C) (05/04 0423) Pulse Rate:  [78-89] 89 (05/04 0423) Resp:  [16-18] 16 (05/04 0423) BP: (117-130)/(52-68) 126/55 (05/04 0423) SpO2:  [96 %-98 %] 97 % (05/04 0423) Weight:  [66.5 kg (146 lb 9.6 oz)] 66.5 kg (146 lb 9.6 oz) (05/03 1505)  Weight change:  Filed Weights   11/19/16 1505  Weight: 66.5 kg (146 lb 9.6 oz)    Intake/Output: I/O last 3 completed shifts: In: 393.3 [P.O.:240; I.V.:153.3] Out: 100 [Urine:100]   Intake/Output this shift:  Total I/O In: 59.3 [I.V.:59.3] Out: -   Physical Exam: General: NAD,   Head: Normocephalic, atraumatic. Moist oral mucosal membranes  Eyes: Anicteric, PERRL  Neck: Supple, trachea midline  Lungs:  Clear to auscultation  Heart: Regular rate and rhythm  Abdomen:  Soft, nontender,   Extremities: no peripheral edema.  Neurologic: Nonfocal, moving all four extremities  Skin: No lesions  Access: none    Basic Metabolic Panel:  Recent Labs Lab 11/19/16 1544 11/20/16 0605  NA 135 135  K 4.9 5.2*  CL 111 115*  CO2 13* 13*  GLUCOSE 105* 92  BUN 78* 80*  CREATININE 6.99* 7.02*  CALCIUM 8.6* 8.5*    Liver Function Tests: No results for input(s): AST, ALT, ALKPHOS, BILITOT, PROT, ALBUMIN in the last 168 hours. No results for input(s): LIPASE, AMYLASE in the last 168 hours. No results for input(s): AMMONIA in the last 168 hours.  CBC:  Recent Labs Lab 11/20/16 0605  WBC 8.2  HGB 8.7*  HCT 27.4*  MCV 93.1  PLT 267    Cardiac Enzymes: No results for input(s): CKTOTAL, CKMB, CKMBINDEX, TROPONINI in the last 168 hours.  BNP: Invalid  input(s): POCBNP  CBG:  Recent Labs Lab 11/19/16 1701 11/19/16 2117 11/20/16 0744  GLUCAP 98 95 85    Microbiology: Results for orders placed or performed during the hospital encounter of 10/02/16  Urine culture     Status: Abnormal   Collection Time: 10/02/16  9:40 AM  Result Value Ref Range Status   Specimen Description URINE, CLEAN CATCH  Final   Special Requests NONE  Final   Culture MULTIPLE SPECIES PRESENT, SUGGEST RECOLLECTION (A)  Final   Report Status 10/03/2016 FINAL  Final    Coagulation Studies: No results for input(s): LABPROT, INR in the last 72 hours.  Urinalysis: No results for input(s): COLORURINE, LABSPEC, PHURINE, GLUCOSEU, HGBUR, BILIRUBINUR, KETONESUR, PROTEINUR, UROBILINOGEN, NITRITE, LEUKOCYTESUR in the last 72 hours.  Invalid input(s): APPERANCEUR    Imaging: Dg Chest Port 1 View  Result Date: 11/19/2016 CLINICAL DATA:  Cough, hypertension, COPD, diabetes mellitus, bladder cancer EXAM: PORTABLE CHEST 1 VIEW COMPARISON:  Portable exam 1709 hours compared to 05/04/2016 FINDINGS: RIGHT jugular Port-A-Cath with tip projecting over SVC. Normal heart size, mediastinal contours, and pulmonary vascularity. Bibasilar atelectasis and decreased basilar lung volumes. Upper lungs clear. No pleural effusion or pneumothorax. Atherosclerotic calcification aorta noted. No acute osseous findings. IMPRESSION: Bibasilar hypoinflation and atelectasis. Aortic atherosclerosis. Electronically Signed   By: Lavonia Dana M.D.   On: 11/19/2016 17:50     Medications:   . sodium  chloride 20 mL/hr at 11/19/16 2120   . atorvastatin  40 mg Oral QPM  . cholecalciferol  1,000 Units Oral Daily  . ferrous sulfate  325 mg Oral Daily  . gabapentin  100 mg Oral TID  . insulin aspart  0-5 Units Subcutaneous QHS  . insulin aspart  0-9 Units Subcutaneous TID WC  . latanoprost  1 drop Both Eyes QHS  . mirabegron ER  25 mg Oral Daily  . oxybutynin  5 mg Oral TID  . pantoprazole  40 mg  Oral Daily   acetaminophen **OR** acetaminophen, albuterol, lidocaine, ondansetron **OR** ondansetron (ZOFRAN) IV  Assessment/ Plan:  [name] is a 76 y.o. black female with hypertension, diabetes type 2, hyperlipidemia, osteoarthritis, COPD, obstructive sleep apnea, obesity, bladder cancer.   1. Chronic kidney disease stage V with hyperkalemia and metabolic acidosis. With uremic symptoms. Now End Stage Renal Disease Scheduled for tunneled catheter and first hemodialysis today.  Discussed dialysis and renal failure with patient.  - Consulted vascular surgery for access placement and vein mapping. Discussed case with Dr. Lucky Cowboy and Dr. Delana Meyer. - Consider vein mapping prior to discharge  2. Hypertension. Blood pressure well-controlled   - currently not on any blood pressure agents.   3. Anemia of chronic kidney disease: hemoglobin 8.7 - Consult hematology about EPO use.  - Continue oral iron  4. Bladder Cancer: s/p TURBT and radiation therapy/chemo with gemcitabine. - Followed by Dr. Hollice Espy and Dr Rogue Bussing  5. Secondary Hyperparathyroidism with hyperphosphatemia: PTH 191 on 4/30.  - start calcium aceate   LOS: Barbara [name] 5/4/201810:54 AM

## 2016-11-21 LAB — HEPATITIS C ANTIBODY: HCV AB: 0.4 {s_co_ratio} (ref 0.0–0.9)

## 2016-11-21 LAB — CBC
HEMATOCRIT: 28.4 % — AB (ref 35.0–47.0)
Hemoglobin: 9.3 g/dL — ABNORMAL LOW (ref 12.0–16.0)
MCH: 29.8 pg (ref 26.0–34.0)
MCHC: 32.6 g/dL (ref 32.0–36.0)
MCV: 91.4 fL (ref 80.0–100.0)
Platelets: 204 10*3/uL (ref 150–440)
RBC: 3.11 MIL/uL — AB (ref 3.80–5.20)
RDW: 15.6 % — AB (ref 11.5–14.5)
WBC: 10.2 10*3/uL (ref 3.6–11.0)

## 2016-11-21 LAB — GLUCOSE, CAPILLARY
GLUCOSE-CAPILLARY: 106 mg/dL — AB (ref 65–99)
GLUCOSE-CAPILLARY: 86 mg/dL (ref 65–99)
GLUCOSE-CAPILLARY: 124 mg/dL — AB (ref 65–99)
Glucose-Capillary: 82 mg/dL (ref 65–99)

## 2016-11-21 LAB — BASIC METABOLIC PANEL
ANION GAP: 9 (ref 5–15)
BUN: 67 mg/dL — AB (ref 6–20)
CHLORIDE: 107 mmol/L (ref 101–111)
CO2: 16 mmol/L — ABNORMAL LOW (ref 22–32)
Calcium: 8.2 mg/dL — ABNORMAL LOW (ref 8.9–10.3)
Creatinine, Ser: 6.52 mg/dL — ABNORMAL HIGH (ref 0.44–1.00)
GFR calc Af Amer: 6 mL/min — ABNORMAL LOW (ref >60)
GFR, EST NON AFRICAN AMERICAN: 5 mL/min — AB (ref >60)
Glucose, Bld: 92 mg/dL (ref 65–99)
POTASSIUM: 4.7 mmol/L (ref 3.5–5.1)
SODIUM: 132 mmol/L — AB (ref 135–145)

## 2016-11-21 LAB — HEPATITIS B SURFACE ANTIBODY, QUANTITATIVE: Hepatitis B-Post: <3.1 m[IU]/mL — ABNORMAL LOW (ref >9.9)

## 2016-11-21 LAB — HEPATITIS B CORE ANTIBODY, TOTAL: Hep B Core Total Ab: NEGATIVE

## 2016-11-21 LAB — HEPATITIS B SURFACE ANTIGEN: Hepatitis B Surface Ag: NEGATIVE

## 2016-11-21 LAB — HIV ANTIBODY (ROUTINE TESTING W REFLEX): HIV Screen 4th Generation wRfx: NONREACTIVE

## 2016-11-21 NOTE — Progress Notes (Signed)
[facility]  ROUNDING NOTE   Subjective:   First dialysis yesterday. Tolerated treatment well. Plan for second treatment for today.   Patient has no complaints.   Objective:  Vital signs in last 24 hours:  Temp:  [98.1 F (36.7 C)-99.5 F (37.5 C)] 98.5 F (36.9 C) (05/05 0429) Pulse Rate:  [87-99] 88 (05/05 0429) Resp:  [16-20] 16 (05/05 0429) BP: (95-136)/(49-74) 119/57 (05/05 0429) SpO2:  [92 %-98 %] 97 % (05/05 0429) Weight:  [67 kg (147 lb 11.3 oz)] 67 kg (147 lb 11.3 oz) (05/04 1700)  Weight change: 0.503 kg (1 lb 1.7 oz) Filed Weights   11/19/16 1505 11/20/16 1510 11/20/16 1700  Weight: 66.5 kg (146 lb 9.6 oz) 67 kg (147 lb 11.3 oz) 67 kg (147 lb 11.3 oz)    Intake/Output: I/O last 3 completed shifts: In: 212.7 [I.V.:212.7] Out: -67    Intake/Output this shift:  Total I/O In: 120 [P.O.:120] Out: -   Physical Exam: General: NAD,   Head: Normocephalic, atraumatic. Moist oral mucosal membranes  Eyes: Anicteric, PERRL  Neck: Supple, trachea midline  Lungs:  Clear to auscultation  Heart: Regular rate and rhythm  Abdomen:  Soft, nontender,   Extremities: no peripheral edema.  Neurologic: Nonfocal, moving all four extremities  Skin: No lesions  Access: LIJ permcath 5/4 Dr. Lucky Cowboy    Basic Metabolic Panel:  Recent Labs Lab 11/19/16 1544 11/20/16 0605 11/21/16 0534  NA 135 135 132*  K 4.9 5.2* 4.7  CL 111 115* 107  CO2 13* 13* 16*  GLUCOSE 105* 92 92  BUN 78* 80* 67*  CREATININE 6.99* 7.02* 6.52*  CALCIUM 8.6* 8.5* 8.2*    Liver Function Tests: No results for input(s): AST, ALT, ALKPHOS, BILITOT, PROT, ALBUMIN in the last 168 hours. No results for input(s): LIPASE, AMYLASE in the last 168 hours. No results for input(s): AMMONIA in the last 168 hours.  CBC:  Recent Labs Lab 11/20/16 0605 11/21/16 0534  WBC 8.2 10.2  HGB 8.7* 9.3*  HCT 27.4* 28.4*  MCV 93.1 91.4  PLT 267 204    Cardiac Enzymes: No results for input(s):  CKTOTAL, CKMB, CKMBINDEX, TROPONINI in the last 168 hours.  BNP: Invalid input(s): POCBNP  CBG:  Recent Labs Lab 11/20/16 0744 11/20/16 1133 11/20/16 1623 11/20/16 2106 11/21/16 0743  GLUCAP 85 83 83 98 82    Microbiology: Results for orders placed or performed during the hospital encounter of 10/02/16  Urine culture     Status: Abnormal   Collection Time: 10/02/16  9:40 AM  Result Value Ref Range Status   Specimen Description URINE, CLEAN CATCH  Final   Special Requests NONE  Final   Culture MULTIPLE SPECIES PRESENT, SUGGEST RECOLLECTION (A)  Final   Report Status 10/03/2016 FINAL  Final    Coagulation Studies: No results for input(s): LABPROT, INR in the last 72 hours.  Urinalysis: No results for input(s): COLORURINE, LABSPEC, PHURINE, GLUCOSEU, HGBUR, BILIRUBINUR, KETONESUR, PROTEINUR, UROBILINOGEN, NITRITE, LEUKOCYTESUR in the last 72 hours.  Invalid input(s): APPERANCEUR    Imaging: Dg Chest Port 1 View  Result Date: 11/19/2016 CLINICAL DATA:  Cough, hypertension, COPD, diabetes mellitus, bladder cancer EXAM: PORTABLE CHEST 1 VIEW COMPARISON:  Portable exam 1709 hours compared to 05/04/2016 FINDINGS: RIGHT jugular Port-A-Cath with tip projecting over SVC. Normal heart size, mediastinal contours, and pulmonary vascularity. Bibasilar atelectasis and decreased basilar lung volumes. Upper lungs clear. No pleural effusion or pneumothorax. Atherosclerotic calcification aorta noted. No acute osseous findings. IMPRESSION: Bibasilar  hypoinflation and atelectasis. Aortic atherosclerosis. Electronically Signed   By: Lavonia Dana M.D.   On: 11/19/2016 17:50     Medications:    . atorvastatin  40 mg Oral QPM  . calcium acetate  1,334 mg Oral TID WC  . cholecalciferol  1,000 Units Oral Daily  . ferrous sulfate  325 mg Oral Daily  . gabapentin  100 mg Oral TID  . insulin aspart  0-5 Units Subcutaneous QHS  . insulin aspart  0-9 Units Subcutaneous TID WC  . latanoprost  1  drop Both Eyes QHS  . mirabegron ER  25 mg Oral Daily  . oxybutynin  5 mg Oral TID  . pantoprazole  40 mg Oral Daily  . tuberculin  5 Units Intradermal Once   acetaminophen **OR** acetaminophen, albuterol, heparin lock flush, heparin lock flush, heparin lock flush, lidocaine, ondansetron **OR** ondansetron (ZOFRAN) IV, sodium chloride flush, sodium chloride flush, sodium chloride flush  Assessment/ Plan:  [name] is a 76 y.o. black female with hypertension, diabetes type 2, hyperlipidemia, osteoarthritis, COPD, obstructive sleep apnea, obesity, bladder cancer.  1. End Stage Renal Disease with hyperkalemia and metabolic acidosis. With uremic symptoms.  Tunneled dialysis catheter placed.  First dialysis 11/20/16 - Second treatment scheduled for today - PPD to be read on 5/6 - Discussed dialysis and renal failure with patient.  - Pending vein mapping.  - Outpatient planning for [facility].   2. Hypertension. Blood pressure well-controlled   - currently not on any blood pressure agents.   3. Anemia of chronic kidney disease: hemoglobin 9.3 Hematology has cleared patient to get ESA therapy  4. Bladder Cancer: s/p TURBT and radiation therapy/chemo with gemcitabine. - Followed by Dr. Hollice Espy and Dr Rogue Bussing  5. Secondary Hyperparathyroidism with hyperphosphatemia: PTH 191 on 4/30.  - calcium aceate   LOS: [place], [place] 5/5/201810:03 AM

## 2016-11-21 NOTE — Progress Notes (Signed)
[place] at [place] NAME: Barbara [name]    MR#:  [id number]  DATE OF BIRTH:  04/17/1941  SUBJECTIVE:  CHIEF COMPLAINT:  No chief complaint on file.  Nausea. Weakness Tolerating HD well  REVIEW OF SYSTEMS:    Review of Systems  Constitutional: Positive for malaise/fatigue. Negative for chills and fever.  HENT: Negative for sore throat.   Eyes: Negative for blurred vision, double vision and pain.  Respiratory: Negative for cough, hemoptysis, shortness of breath and wheezing.   Cardiovascular: Negative for chest pain, palpitations, orthopnea and leg swelling.  Gastrointestinal: Positive for nausea. Negative for abdominal pain, constipation, diarrhea, heartburn and vomiting.  Genitourinary: Negative for dysuria and hematuria.  Musculoskeletal: Negative for back pain and joint pain.  Skin: Negative for rash.  Neurological: Positive for weakness. Negative for sensory change, speech change, focal weakness and headaches.  Endo/Heme/Allergies: Does not bruise/bleed easily.  Psychiatric/Behavioral: Negative for depression. The patient is not nervous/anxious.     DRUG ALLERGIES:   Allergies  Allergen Reactions  . Glipizide Er     Dropped blood sugar too much  . Percocet [Oxycodone-Acetaminophen] Hives    VITALS:  Blood pressure (!) 100/55, pulse 89, temperature 99.4 F (37.4 C), temperature source Oral, resp. rate 18, height 5' 8"  (1.727 m), weight 67 kg (147 lb 11.3 oz), SpO2 96 %.  PHYSICAL EXAMINATION:   Physical Exam  GENERAL:  76 y.o.-year-old patient lying in the bed with no acute distress.  EYES: Pupils equal, round, reactive to light and accommodation. No scleral icterus. Extraocular muscles intact.  HEENT: Head atraumatic, normocephalic. Oropharynx and nasopharynx clear.  NECK:  Supple, no jugular venous distention. No thyroid enlargement, no tenderness.  LUNGS: Normal breath sounds bilaterally, no wheezing, rales, rhonchi. No  use of accessory muscles of respiration. HD Access + CARDIOVASCULAR: S1, S2 normal. No murmurs, rubs, or gallops.  ABDOMEN: Soft, nontender, nondistended. Bowel sounds present. No organomegaly or mass.  EXTREMITIES: No cyanosis, clubbing or edema b/l.    NEUROLOGIC: Cranial nerves II through XII are intact. No focal Motor or sensory deficits b/l.   PSYCHIATRIC: The patient is alert and oriented x 3.  SKIN: No obvious rash, lesion, or ulcer.   LABORATORY PANEL:   CBC  Recent Labs Lab 11/21/16 0534  WBC 10.2  HGB 9.3*  HCT 28.4*  PLT 204   ------------------------------------------------------------------------------------------------------------------ Chemistries   Recent Labs Lab 11/21/16 0534  NA 132*  K 4.7  CL 107  CO2 16*  GLUCOSE 92  BUN 67*  CREATININE 6.52*  CALCIUM 8.2*   ------------------------------------------------------------------------------------------------------------------  Cardiac Enzymes No results for input(s): TROPONINI in the last 168 hours. ------------------------------------------------------------------------------------------------------------------  RADIOLOGY:  Dg Chest Port 1 View  Result Date: 11/19/2016 CLINICAL DATA:  Cough, hypertension, COPD, diabetes mellitus, bladder cancer EXAM: PORTABLE CHEST 1 VIEW COMPARISON:  Portable exam 1709 hours compared to 05/04/2016 FINDINGS: RIGHT jugular Port-A-Cath with tip projecting over SVC. Normal heart size, mediastinal contours, and pulmonary vascularity. Bibasilar atelectasis and decreased basilar lung volumes. Upper lungs clear. No pleural effusion or pneumothorax. Atherosclerotic calcification aorta noted. No acute osseous findings. IMPRESSION: Bibasilar hypoinflation and atelectasis. Aortic atherosclerosis. Electronically Signed   By: Lavonia Dana M.D.   On: 11/19/2016 17:50     ASSESSMENT AND PLAN:  Barbara [name]  is a 76 y.o. female with a known history of Chronic kidney disease stage V.  Patient on follow-up laboratory testing shows her GFR has gone down to 6 and she's not feeling  well.  * Chronic kidney disease that has progressed to end-stage renal disease and now on HD Associated hyperkalemia and uremic symptoms -s/p Perm cath placement and now on hemo- Dialysis  - Appreciate nephrology and vascular help. -PPD placed. To be read on 11/22/16  * Neuropathy on gabapentin  * History of bladder cancer and ureteral stents. Patient on oxybutynin and myrbetrec Follows with Dr. Erlene Quan  *a nemia of chronic disease on iron. Seen by Dr B -started on Procrit  * History of sleep apnea on oxygen and CPAP at night  * History of type 2 diabetes mellitus without complication.  off medications at this point. We'll check fingersticks in Place on sliding scale.  All the records are reviewed and case discussed with Care Management/Social Worker Management plans discussed with the patient, family and they are in agreement.  CODE STATUS: FULL CODE  DVT Prophylaxis: SCDs  TOTAL TIME TAKING CARE OF THIS PATIENT: 30 minutes.   POSSIBLE D/C IN 2-3 DAYS, DEPENDING ON CLINICAL CONDITION.  Ahmani Daoud M.D on 11/21/2016 at 12:27 PM  Between 7am to 6pm - Pager - [phone]  After 6pm go to [email or web address] - password [email or web address] [facility]  Office  [phone]  CC: Primary care physician; Madelyn Brunner, MD  Note: This dictation was prepared with Dragon dictation along with smaller phrase technology. Any transcriptional errors that result from this process are unintentional.

## 2016-11-21 NOTE — Progress Notes (Signed)
HD COMPLETED  

## 2016-11-21 NOTE — Evaluation (Signed)
Physical Therapy Evaluation Patient Details Name: Barbara [name] MRN: [id number] DOB: 10/04/1940 Today's Date: 11/21/2016   History of Present Illness  76 yo female with onset of weakness and acute kidney injury, has new HD shunt and starting HD. Has had L thigh pain since last several months, impacting gait.  PMHx:  metabolic acidosis, bladder CA, DM, CKD 5, neuropathy,   Clinical Impression  Pt is up to walk with SPC and due to L thigh pain which she has had for several months, she needs BUE support.  Will ask MD to assess this, and will continue acute therapy to progress with appropriate AD and to transition to HHPT and will also attempt stair for mobility into house as she is able.    Follow Up Recommendations Home health PT;Supervision for mobility/OOB    Equipment Recommendations  Rolling walker with 5" wheels    Recommendations for Other Services       Precautions / Restrictions Precautions Precautions: Fall (telemetry) Restrictions Weight Bearing Restrictions: No      Mobility  Bed Mobility Overal bed mobility: Needs Assistance Bed Mobility: Supine to Sit;Sit to Supine     Supine to sit: Min assist Sit to supine: Min assist   General bed mobility comments: mainly assisted trunk to sit and legs back to bed  Transfers Overall transfer level: Needs assistance Equipment used: Straight cane;1 person hand held assist Transfers: Sit to/from [facility] Sit to Stand: Min assist Stand pivot transfers: Min assist       General transfer comment: pain limits her tolerance for mobility which SPC is not fully covering  Ambulation/Gait Ambulation/Gait assistance: Min assist;Mod assist Ambulation Distance (Feet): 30 Feet Assistive device: 1 person hand held assist;Straight cane Gait Pattern/deviations: Step-through pattern;Step-to pattern;Shuffle;Antalgic;Decreased weight shift to left;Wide base of support;Trunk flexed Gait velocity: reduced Gait velocity  interpretation: Below normal speed for age/gender General Gait Details: painful L LE that restricts her gait distance and comfort, stopped x 3 during trip  Stairs            Wheelchair Mobility    Modified Rankin (Stroke Patients Only)       Balance Overall balance assessment: Needs assistance Sitting-balance support: Feet supported Sitting balance-Leahy Scale: Fair     Standing balance support: Bilateral upper extremity supported Standing balance-Leahy Scale: Poor                               Pertinent Vitals/Pain Pain Assessment: Faces Faces Pain Scale: Hurts even more Pain Location: L thigh Pain Descriptors / Indicators: Aching;Sore Pain Intervention(s): Monitored during session;Premedicated before session;Repositioned    Home Living Family/patient expects to be discharged to:: Private residence Living Arrangements: Spouse/significant other Available Help at Discharge: Family;Available 24 hours/day Type of Home: House Home Access: Stairs to enter Entrance Stairs-Rails: None Entrance Stairs-Number of Steps: 1 Home Layout: One level Home Equipment: Walker - 2 wheels;Cane - single point;Wheelchair - manual Additional Comments: Social history taken from previous documentation; patient slightly unwilling to provide details/information during session this date    Prior Function Level of Independence: Independent with assistive device(s)         Comments: RW vs SPC per family     Hand Dominance        Extremity/Trunk Assessment   Upper Extremity Assessment Upper Extremity Assessment: Overall WFL for tasks assessed    Lower Extremity Assessment Lower Extremity Assessment: LLE deficits/detail LLE Deficits / Details: painful L  thigh with antalgic gait LLE Coordination: decreased gross motor    Cervical / Trunk Assessment Cervical / Trunk Assessment: Kyphotic  Communication   Communication: No difficulties  Cognition Arousal/Alertness:  Awake/alert Behavior During Therapy: WFL for tasks assessed/performed Overall Cognitive Status: Within Functional Limits for tasks assessed                                        General Comments      Exercises     Assessment/Plan    PT Assessment Patient needs continued PT services  PT Problem List Decreased strength;Decreased range of motion;Decreased activity tolerance;Decreased balance;Decreased mobility;Decreased coordination;Decreased knowledge of use of DME;Decreased safety awareness;Pain       PT Treatment Interventions DME instruction;Gait training;Stair training;Functional mobility training;Therapeutic activities;Therapeutic exercise;Balance training;Neuromuscular re-education;Patient/family education    PT Goals (Current goals can be found in the Care Plan section)  Acute Rehab PT Goals Patient Stated Goal: to get L leg hurting less PT Goal Formulation: With patient/family Time For Goal Achievement: 12/05/16 Potential to Achieve Goals: Good    Frequency Min 2X/week   Barriers to discharge Inaccessible home environment (one step)      Co-evaluation               AM-PAC PT "6 Clicks" Daily Activity  Outcome Measure Difficulty turning over in bed (including adjusting bedclothes, sheets and blankets)?: Total Difficulty moving from lying on back to sitting on the side of the bed? : Total Difficulty sitting down on and standing up from a chair with arms (e.g., wheelchair, bedside commode, etc,.)?: Total Help needed moving to and from a bed to chair (including a wheelchair)?: A Lot Help needed walking in hospital room?: A Lot Help needed climbing 3-5 steps with a railing? : Total 6 Click Score: 8    End of Session Equipment Utilized During Treatment: Gait belt Activity Tolerance: Patient limited by pain Patient left: in bed;with call bell/phone within reach;with bed alarm set;with family/visitor present Nurse Communication: Mobility  status;Other (comment) (L thigh pain) PT Visit Diagnosis: Muscle weakness (generalized) (M62.81);Pain;Difficulty in walking, not elsewhere classified (R26.2) Pain - Right/Left: Left Pain - part of body: Leg    Time: 1346-1410 PT Time Calculation (min) (ACUTE ONLY): 24 min   Charges:   PT Evaluation $PT Eval Moderate Complexity: 1 Procedure PT Treatments $Gait Training: 8-22 mins   PT G Codes:   PT G-Codes **NOT FOR INPATIENT CLASS** Functional Assessment Tool Used: AM-PAC 6 Clicks Basic Mobility    Ramond Dial 11/21/2016, 3:59 PM   Mee Hives, PT MS Acute Rehab Dept. Number: [place] and [place]

## 2016-11-21 NOTE — Progress Notes (Signed)
POST DIALYSIS ASSESSMENT 

## 2016-11-21 NOTE — Progress Notes (Signed)
Pt placed on [facility] CPAP C-1. CPAP plugged into red outlet

## 2016-11-21 NOTE — Progress Notes (Signed)
HD STARTED  

## 2016-11-21 NOTE — Progress Notes (Signed)
PRE DIALYSIS ASSESSMENT 

## 2016-11-22 LAB — GLUCOSE, CAPILLARY
GLUCOSE-CAPILLARY: 104 mg/dL — AB (ref 65–99)
GLUCOSE-CAPILLARY: 121 mg/dL — AB (ref 65–99)
GLUCOSE-CAPILLARY: 112 mg/dL — AB (ref 65–99)
Glucose-Capillary: 104 mg/dL — ABNORMAL HIGH (ref 65–99)

## 2016-11-22 NOTE — Progress Notes (Signed)
Patient refused cpap. RN aware.

## 2016-11-22 NOTE — Progress Notes (Signed)
[place] at [place] NAME: Barbara [name]    MR#:  [id number]  DATE OF BIRTH:  September 07, 1940  SUBJECTIVE:  CHIEF COMPLAINT:  No chief complaint on file.  Nausea. Weakness Tolerating HD well  REVIEW OF SYSTEMS:    Review of Systems  Constitutional: Positive for malaise/fatigue. Negative for chills and fever.  HENT: Negative for sore throat.   Eyes: Negative for blurred vision, double vision and pain.  Respiratory: Negative for cough, hemoptysis, shortness of breath and wheezing.   Cardiovascular: Negative for chest pain, palpitations, orthopnea and leg swelling.  Gastrointestinal: Positive for nausea. Negative for abdominal pain, constipation, diarrhea, heartburn and vomiting.  Genitourinary: Negative for dysuria and hematuria.  Musculoskeletal: Negative for back pain and joint pain.  Skin: Negative for rash.  Neurological: Positive for weakness. Negative for sensory change, speech change, focal weakness and headaches.  Endo/Heme/Allergies: Does not bruise/bleed easily.  Psychiatric/Behavioral: Negative for depression. The patient is not nervous/anxious.     DRUG ALLERGIES:   Allergies  Allergen Reactions  . Glipizide Er     Dropped blood sugar too much  . Percocet [Oxycodone-Acetaminophen] Hives    VITALS:  Blood pressure (!) 105/48, pulse 91, temperature 98.4 F (36.9 C), temperature source Oral, resp. rate 18, height 5' 8"  (1.727 m), weight 68.5 kg (151 lb 0.2 oz), SpO2 98 %.  PHYSICAL EXAMINATION:   Physical Exam  GENERAL:  76 y.o.-year-old patient lying in the bed with no acute distress.  EYES: Pupils equal, round, reactive to light and accommodation. No scleral icterus. Extraocular muscles intact.  HEENT: Head atraumatic, normocephalic. Oropharynx and nasopharynx clear.  NECK:  Supple, no jugular venous distention. No thyroid enlargement, no tenderness.  LUNGS: Normal breath sounds bilaterally, no wheezing, rales, rhonchi. No  use of accessory muscles of respiration. HD Access + CARDIOVASCULAR: S1, S2 normal. No murmurs, rubs, or gallops.  ABDOMEN: Soft, nontender, nondistended. Bowel sounds present. No organomegaly or mass.  EXTREMITIES: No cyanosis, clubbing or edema b/l.    NEUROLOGIC: Cranial nerves II through XII are intact. No focal Motor or sensory deficits b/l.   PSYCHIATRIC: The patient is alert and oriented x 3.  SKIN: No obvious rash, lesion, or ulcer.   LABORATORY PANEL:   CBC  Recent Labs Lab 11/21/16 0534  WBC 10.2  HGB 9.3*  HCT 28.4*  PLT 204   ------------------------------------------------------------------------------------------------------------------ Chemistries   Recent Labs Lab 11/21/16 0534  NA 132*  K 4.7  CL 107  CO2 16*  GLUCOSE 92  BUN 67*  CREATININE 6.52*  CALCIUM 8.2*   ------------------------------------------------------------------------------------------------------------------  Cardiac Enzymes No results for input(s): TROPONINI in the last 168 hours. ------------------------------------------------------------------------------------------------------------------  RADIOLOGY:  No results found.   ASSESSMENT AND PLAN:  Barbara [name]  is a 76 y.o. female with a known history of Chronic kidney disease stage V. Patient on follow-up laboratory testing shows her GFR has gone down to 6 and she's not feeling well.  * Chronic kidney disease that has progressed to end-stage renal disease and now on HD Associated hyperkalemia and uremic symptoms -s/p Perm cath placement and now on hemo- Dialysis  - Appreciate nephrology and vascular help. -PPD negative   * Neuropathy on gabapentin  * History of bladder cancer and ureteral stents. Patient on oxybutynin and myrbetrec Follows with Dr. Erlene Quan  *a nemia of chronic disease on iron. Seen by Dr B -started on Procrit  * History of sleep apnea on oxygen and CPAP at night  * History  of type 2 diabetes mellitus  without complication.  off medications at this point. We'll check fingersticks in Place on sliding scale.   D/c home with HHPT once Insurance auth obtained of HD Pt has chair time for HD in [place].  All the records are reviewed and case discussed with Care Management/Social Worker Management plans discussed with the patient, family and they are in agreement.  CODE STATUS: FULL CODE  DVT Prophylaxis: SCDs  TOTAL TIME TAKING CARE OF THIS PATIENT: 30 minutes.   POSSIBLE D/C IN 1 DAYS, DEPENDING ON CLINICAL CONDITION.  Barbara [name] M.D on 11/22/2016 at 1:57 PM  Between 7am to 6pm - Pager - [phone]  After 6pm go to [email or web address] - password [email or web address] [facility]  Office  [phone]  CC: Primary care physician; Madelyn Brunner, MD  Note: This dictation was prepared with Dragon dictation along with smaller phrase technology. Any transcriptional errors that result from this process are unintentional.

## 2016-11-22 NOTE — Progress Notes (Signed)
[facility]  ROUNDING NOTE   Subjective:   Second dialysis treatment yesterday. Tolerated treatment well. Plan for third treatment for tomorrow.   Patient has no complaints.   Objective:  Vital signs in last 24 hours:  Temp:  [98 F (36.7 C)-99.7 F (37.6 C)] 98.3 F (36.8 C) (05/06 0912) Pulse Rate:  [87-93] 87 (05/06 0912) Resp:  [14-22] 16 (05/06 0912) BP: (97-123)/(43-65) 97/43 (05/06 0912) SpO2:  [94 %-98 %] 97 % (05/06 0912) Weight:  [68.5 kg (151 lb 0.2 oz)] 68.5 kg (151 lb 0.2 oz) (05/05 1430)  Weight change: 1.5 kg (3 lb 4.9 oz) Filed Weights   11/20/16 1510 11/20/16 1700 11/21/16 1430  Weight: 67 kg (147 lb 11.3 oz) 67 kg (147 lb 11.3 oz) 68.5 kg (151 lb 0.2 oz)    Intake/Output: I/O last 3 completed shifts: In: 120 [P.O.:120] Out: 0    Intake/Output this shift:  No intake/output data recorded.  Physical Exam: General: NAD,   Head: Normocephalic, atraumatic. Moist oral mucosal membranes  Eyes: Anicteric, PERRL  Neck: Supple, trachea midline  Lungs:  Clear to auscultation  Heart: Regular rate and rhythm  Abdomen:  Soft, nontender,   Extremities: no peripheral edema.  Neurologic: Nonfocal, moving all four extremities  Skin: No lesions  Access: LIJ permcath 5/4 Dr. Lucky Cowboy    Basic Metabolic Panel:  Recent Labs Lab 11/19/16 1544 11/20/16 0605 11/21/16 0534  NA 135 135 132*  K 4.9 5.2* 4.7  CL 111 115* 107  CO2 13* 13* 16*  GLUCOSE 105* 92 92  BUN 78* 80* 67*  CREATININE 6.99* 7.02* 6.52*  CALCIUM 8.6* 8.5* 8.2*    Liver Function Tests: No results for input(s): AST, ALT, ALKPHOS, BILITOT, PROT, ALBUMIN in the last 168 hours. No results for input(s): LIPASE, AMYLASE in the last 168 hours. No results for input(s): AMMONIA in the last 168 hours.  CBC:  Recent Labs Lab 11/20/16 0605 11/21/16 0534  WBC 8.2 10.2  HGB 8.7* 9.3*  HCT 27.4* 28.4*  MCV 93.1 91.4  PLT 267 204    Cardiac Enzymes: No results for input(s):  CKTOTAL, CKMB, CKMBINDEX, TROPONINI in the last 168 hours.  BNP: Invalid input(s): POCBNP  CBG:  Recent Labs Lab 11/21/16 0743 11/21/16 1151 11/21/16 1725 11/21/16 2111 11/22/16 0757  GLUCAP 82 106* 86 124* 104*    Microbiology: Results for orders placed or performed during the hospital encounter of 10/02/16  Urine culture     Status: Abnormal   Collection Time: 10/02/16  9:40 AM  Result Value Ref Range Status   Specimen Description URINE, CLEAN CATCH  Final   Special Requests NONE  Final   Culture MULTIPLE SPECIES PRESENT, SUGGEST RECOLLECTION (A)  Final   Report Status 10/03/2016 FINAL  Final    Coagulation Studies: No results for input(s): LABPROT, INR in the last 72 hours.  Urinalysis: No results for input(s): COLORURINE, LABSPEC, PHURINE, GLUCOSEU, HGBUR, BILIRUBINUR, KETONESUR, PROTEINUR, UROBILINOGEN, NITRITE, LEUKOCYTESUR in the last 72 hours.  Invalid input(s): APPERANCEUR    Imaging: No results found.   Medications:    . atorvastatin  40 mg Oral QPM  . calcium acetate  1,334 mg Oral TID WC  . cholecalciferol  1,000 Units Oral Daily  . ferrous sulfate  325 mg Oral Daily  . gabapentin  100 mg Oral TID  . insulin aspart  0-5 Units Subcutaneous QHS  . insulin aspart  0-9 Units Subcutaneous TID WC  . latanoprost  1 drop Both Eyes  QHS  . mirabegron ER  25 mg Oral Daily  . oxybutynin  5 mg Oral TID  . pantoprazole  40 mg Oral Daily  . tuberculin  5 Units Intradermal Once   acetaminophen **OR** acetaminophen, albuterol, heparin lock flush, heparin lock flush, heparin lock flush, lidocaine, ondansetron **OR** ondansetron (ZOFRAN) IV, sodium chloride flush, sodium chloride flush, sodium chloride flush  Assessment/ Plan:  [name] is a 76 y.o. black female with hypertension, diabetes type 2, hyperlipidemia, osteoarthritis, COPD, obstructive sleep apnea, obesity, bladder cancer.  1. End Stage Renal Disease with hyperkalemia and metabolic acidosis.  With uremic symptoms.  Tunneled dialysis catheter placed.  First dialysis 11/20/16 - Second treatment scheduled for today - PPD to be read on 5/6 - Discussed dialysis and renal failure with patient.  - Pending vein mapping.  - Outpatient planning for [facility].   2. Hypertension. Blood pressure well-controlled   - currently not on any blood pressure agents.   3. Anemia of chronic kidney disease: hemoglobin 9.3 Hematology has cleared patient to get ESA therapy - EPO with HD treatment tomorrow.   4. Bladder Cancer: s/p TURBT and radiation therapy/chemo with gemcitabine. - Followed by Dr. Hollice Espy and Dr Rogue Bussing  5. Secondary Hyperparathyroidism with hyperphosphatemia: PTH 191 on 4/30.  - calcium aceate for binding.    LOS: Barbara [name], Barbara [name] 5/6/201810:03 AM

## 2016-11-23 ENCOUNTER — Ambulatory Visit: Admission: RE | Admit: 2016-11-23 | Payer: Medicare Other | Source: Ambulatory Visit | Admitting: Vascular Surgery

## 2016-11-23 ENCOUNTER — Encounter: Payer: Self-pay | Admitting: Vascular Surgery

## 2016-11-23 ENCOUNTER — Encounter: Admission: RE | Payer: Self-pay | Source: Ambulatory Visit

## 2016-11-23 LAB — RENAL FUNCTION PANEL
ALBUMIN: 2.7 g/dL — AB (ref 3.5–5.0)
ANION GAP: 9 (ref 5–15)
BUN: 59 mg/dL — AB (ref 6–20)
CO2: 24 mmol/L (ref 22–32)
CREATININE: 7.27 mg/dL — AB (ref 0.44–1.00)
Calcium: 8.4 mg/dL — ABNORMAL LOW (ref 8.9–10.3)
Chloride: 99 mmol/L — ABNORMAL LOW (ref 101–111)
GFR calc Af Amer: 6 mL/min — ABNORMAL LOW (ref >60)
GFR calc non Af Amer: 5 mL/min — ABNORMAL LOW (ref >60)
GLUCOSE: 104 mg/dL — AB (ref 65–99)
PHOSPHORUS: 4.9 mg/dL — AB (ref 2.5–4.6)
Potassium: 4.8 mmol/L (ref 3.5–5.1)
SODIUM: 132 mmol/L — AB (ref 135–145)

## 2016-11-23 LAB — CBC
HCT: 25.4 % — ABNORMAL LOW (ref 35.0–47.0)
HEMOGLOBIN: 8.5 g/dL — AB (ref 12.0–16.0)
MCH: 30.3 pg (ref 26.0–34.0)
MCHC: 33.5 g/dL (ref 32.0–36.0)
MCV: 90.5 fL (ref 80.0–100.0)
PLATELETS: 180 10*3/uL (ref 150–440)
RBC: 2.81 MIL/uL — ABNORMAL LOW (ref 3.80–5.20)
RDW: 15.2 % — AB (ref 11.5–14.5)
WBC: 9.9 10*3/uL (ref 3.6–11.0)

## 2016-11-23 LAB — GLUCOSE, CAPILLARY
GLUCOSE-CAPILLARY: 100 mg/dL — AB (ref 65–99)
GLUCOSE-CAPILLARY: 102 mg/dL — AB (ref 65–99)
Glucose-Capillary: 102 mg/dL — ABNORMAL HIGH (ref 65–99)

## 2016-11-23 SURGERY — DIALYSIS/PERMA CATHETER INSERTION
Anesthesia: Moderate Sedation

## 2016-11-23 MED ORDER — CALCIUM ACETATE (PHOS BINDER) 667 MG PO CAPS: 1334.0000 mg | ORAL_CAPSULE | Freq: Three times a day (TID) | ORAL | 0 refills | Status: DC

## 2016-11-23 MED ORDER — HEPARIN SODIUM (PORCINE) 5000 UNIT/ML IJ SOLN: 5000.0000 [IU] | Freq: Three times a day (TID) | INTRAMUSCULAR | Status: DC

## 2016-11-23 MED ORDER — EPOETIN ALFA 10000 UNIT/ML IJ SOLN
10000.0000 [IU] | Freq: Once | INTRAMUSCULAR | Status: AC
  Administered 2016-11-23: 10000 [IU] via INTRAVENOUS

## 2016-11-23 NOTE — Care Management (Addendum)
Patient admitted with Chronic kidney disease .  Patient lives at home with husband.  PCP walker.  Pharmacy [facility].  Patient was a new HD start admitted.  Patient is scheduled for outpatient clinic MWF at [facility].  Patient states that she has a RW, WC and cane in the home for ambulation.  PT has assessed patient and recommended home health PT.  Patient was provided choice of home health agency.  Patient states that she does have an agency preference, but is aware that the one she used before isn't in network ([facility]).  Referral made to [place] with [facility] for RN and PT. [name] HD liaison notified of discharge. RNCM signing off

## 2016-11-23 NOTE — Care Management (Signed)
Per [name] outpatient HD schedule MWF Barbara [name] Raven 12:00.  Will follow up with patient regarding home health after patient returns to floor from HD

## 2016-11-23 NOTE — Progress Notes (Signed)
Okay per Dr. Posey Pronto to discharge pt upon arrival back after dialysis. VSS and pt wanting to go home.

## 2016-11-23 NOTE — Discharge Instructions (Signed)
Your hemodialysis on Monday Wednesday Friday at [facility] in [place]

## 2016-11-23 NOTE — Progress Notes (Signed)
   11/22/16 1818  PPD Results  Does patient have an induration at the injection site? No  Induration(mm) 0 mm  Name of Physician Notified DR. SONA PATEL

## 2016-11-23 NOTE — Discharge Summary (Signed)
[facility] [place] NAME: Barbara [name]    MR#:  [id number]  DATE OF BIRTH:  1940/08/23  DATE OF ADMISSION:  11/19/2016 ADMITTING PHYSICIAN: Loletha Grayer, MD  DATE OF DISCHARGE: 11/23/16  PRIMARY CARE PHYSICIAN: Madelyn Brunner, MD    ADMISSION DIAGNOSIS:  chronic kidney disease renal failure end stage renal disease  DISCHARGE DIAGNOSIS:  ESRD now on hemodialysis started during this admission Anemia of chronic disease Hypertension SECONDARY DIAGNOSIS:   Past Medical History:  Diagnosis Date  . Anemia associated with chronic renal failure 2017   blood transfusion last week 10/17  . Arthritis   . Cancer ([place]) 2017   bladder  . Chronic kidney disease   . 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  dr. Candiss Norse and dr. Holley Raring follow her  . COPD (chronic obstructive pulmonary disease) ([place])   . Diabetes mellitus without complication ([place])   . Dyspnea    with exertion  . Elevated lipids   . GERD (gastroesophageal reflux disease)   . Hematuria   . Hypertension   . Lower back pain   . Oxygen dependent    at hs  . Personal history of chemotherapy   . Sleep apnea    uses cpap  . Urinary obstruction 01/2016    HOSPITAL COURSE:   [name] a 76 y.o.femalewith a known history of Chronic kidney disease stage V. Patient on follow-up laboratory testing shows her GFR has gone down to 6 and she's not feeling well.  * Chronic kidney disease that has progressed to end-stage renal disease and now on HD Associated hyperkalemia and uremic symptoms -s/p Perm cath placement and now on hemoDialysis  - Appreciate nephrology and vascular help. -PPD negative  -Patient has been set up with [facility] in [place] on Monday Wednesday Friday.  * Neuropathy on gabapentin  * History of bladder cancer and ureteral stents. Patient on oxybutynin and myrbetrec Follows with Dr. Erlene Quan  *a nemia of chronic disease on  iron. Seen by Dr B -started on Procrit  * History of sleep apnea on oxygen and CPAP at night  * History of type 2 diabetes mellitus without complication.  off medications at this point. We'll check fingersticks in Place on sliding scale.  PT recommended home health PT and RN will be arranged. Discussed with granddaughter Dejuana and she is agreeable with the plan Discharged home after dialysis today   CONSULTS OBTAINED:  Treatment Team:  Algernon Huxley, MD Cammie Sickle, MD  DRUG ALLERGIES:   Allergies  Allergen Reactions  . Glipizide Er     Dropped blood sugar too much  . Percocet [Oxycodone-Acetaminophen] Hives    DISCHARGE MEDICATIONS:   Current Discharge Medication List    START taking these medications   Details  calcium acetate (PHOSLO) 667 MG capsule Take 2 capsules (1,334 mg total) by mouth 3 (three) times daily with meals. Qty: 90 capsule, Refills: 0      CONTINUE these medications which have NOT CHANGED   Details  acetaminophen (TYLENOL) 500 MG tablet Take 500 mg by mouth every 6 (six) hours as needed for moderate pain or headache.    Albuterol Sulfate (PROAIR RESPICLICK) 440 (90 Base) MCG/ACT AEPB Inhale 2 puffs into the lungs 4 (four) times daily as needed (wheezing).     atorvastatin (LIPITOR) 40 MG tablet Take 40 mg by mouth every evening.     cholecalciferol (VITAMIN D) 1000 units  tablet Take 1,000 Units by mouth daily.    esomeprazole (NEXIUM) 40 MG capsule Take 40 mg by mouth daily at 12 noon.    ferrous sulfate 325 (65 FE) MG EC tablet Take 325 mg by mouth daily.    gabapentin (NEURONTIN) 300 MG capsule Take 600 mg by mouth 3 (three) times daily.    lidocaine (XYLOCAINE) 5 % ointment Apply 1 application topically daily as needed for mild pain.     MYRBETRIQ 25 MG TB24 tablet TAKE 1 TABLET (25 MG TOTAL) BY MOUTH DAILY. Qty: 30 tablet, Refills: 5    NON FORMULARY Place 1 Units into the nose at bedtime. CPAP Time of use 2100-0600     oxybutynin (DITROPAN) 5 MG tablet Take 5 mg by mouth 3 (three) times daily.    OXYGEN Inhale 2 L into the lungs at bedtime.   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  You Must read complete instructions/literature along with all the possible adverse reactions/side effects for all the Medicines you take and that have been prescribed to you. Take any new Medicines after you have completely understood and accept all the possible adverse reactions/side effects.   Please note  You were cared for by a hospitalist during your hospital stay. If you have any questions about your discharge medications or the care you received while you were in the hospital after you are discharged, you can call the unit and asked to speak with the hospitalist on call if the hospitalist that took care of you is not available. Once you are discharged, your primary care physician will handle any further medical issues. Please note that NO REFILLS for any discharge medications will be authorized once you are discharged, as it is imperative that you return to your primary care physician (or establish a relationship with a primary care physician if you do not have one) for your aftercare needs so that they can reassess your need for medications and monitor your lab values. Today   SUBJECTIVE    No new complaints VITAL SIGNS:  Blood  pressure 95/63, pulse 87, temperature 99.3 F (37.4 C), temperature source Oral, resp. rate (!) 22, height 5' 8"  (1.727 m), weight 69.1 kg (152 lb 5.4 oz), SpO2 97 %.  I/O:   Intake/Output Summary (Last 24 hours) at 11/23/16 1513 Last data filed at 11/23/16 1335  Gross per 24 hour  Intake              720 ml  Output                0 ml  Net              720 ml    PHYSICAL EXAMINATION:  GENERAL:  76 y.o.-year-old patient lying in the bed with no acute distress.  EYES: Pupils equal, round, reactive to light and accommodation. No scleral  icterus. Extraocular muscles intact.  HEENT: Head atraumatic, normocephalic. Oropharynx and nasopharynx clear.  NECK:  Supple, no jugular venous distention. No thyroid enlargement, no tenderness.  LUNGS: Normal breath sounds bilaterally, no wheezing, rales,rhonchi or crepitation. No use of accessory muscles of respiration. HD catheter present CARDIOVASCULAR: S1, S2 normal. No murmurs, rubs, or gallops.  ABDOMEN: Soft, non-tender, non-distended. Bowel sounds present. No organomegaly or mass.  EXTREMITIES: No pedal edema, cyanosis, or clubbing.  NEUROLOGIC: Cranial nerves II through XII are intact. Muscle strength 5/5 in all extremities. Sensation intact. Gait not checked.  PSYCHIATRIC: The patient is alert and oriented x 3.  SKIN: No obvious rash, lesion, or ulcer.   DATA REVIEW:   CBC   Recent Labs Lab 11/23/16 1423  WBC 9.9  HGB 8.5*  HCT 25.4*  PLT 180    Chemistries   Recent Labs Lab 11/21/16 0534  NA 132*  K 4.7  CL 107  CO2 16*  GLUCOSE 92  BUN 67*  CREATININE 6.52*  CALCIUM 8.2*    Microbiology Results   No results found for this or any previous visit (from the past 240 hour(s)).  RADIOLOGY:  No results found.   Management plans discussed with the patient, family and they are in agreement.  CODE STATUS:     Code Status Orders        Start     Ordered   11/19/16 1528  Full code  Continuous     11/19/16 1528     Code Status History    Date Active Date Inactive Code Status Order ID Comments User Context   06/05/2016  1:08 AM 06/08/2016  1:45 PM Full Code [id number]  Lance Coon, MD Inpatient   05/12/2016  2:32 PM 05/15/2016  5:46 PM Full Code [id number]  Hower, Aaron Mose, MD Inpatient   05/04/2016  2:55 PM 05/09/2016  6:28 PM Full Code [id number]  Epifanio Lesches, MD ED   04/17/2016  1:36 PM 04/20/2016  8:41 PM Full Code [id number]  Demetrios Loll, MD Inpatient   03/04/2016  1:58 PM 03/10/2016  2:40 PM Full Code [id number]  Hillary Bow, MD ED   01/21/2016  9:15 PM 01/27/2016  6:01 PM Full Code [id number]  Edwin Dada, MD Inpatient   01/20/2016  6:41 PM 01/21/2016  9:15 PM Full Code [id number]  Theodoro Grist, MD Inpatient   12/18/2015  2:14 AM 12/23/2015  4:51 PM Full Code [id number]  Saundra Shelling, MD Inpatient      TOTAL TIME TAKING CARE OF THIS PATIENT: *40* minutes.    Toron Bowring M.D on 11/23/2016 at 3:13 PM  Between 7am to 6pm - Pager - [phone] After 6pm go to [email or web address] - password [email or web address] [facility]  Office  [phone]  CC: Primary care physician; Madelyn Brunner, MD

## 2016-11-23 NOTE — Progress Notes (Signed)
[facility]  ROUNDING NOTE   Subjective:   Patient seen while eating lunch Denies any acute complaints of shortness of breath awaiting dialysis   Objective:  Vital signs in last 24 hours:  Temp:  [98.3 F (36.8 C)-99.3 F (37.4 C)] 98.3 F (36.8 C) (05/07 1722) Pulse Rate:  [83-94] 93 (05/07 1722) Resp:  [15-28] 28 (05/07 1722) BP: (92-119)/(45-68) 117/64 (05/07 1722) SpO2:  [94 %-98 %] 94 % (05/07 1722) Weight:  [65.9 kg (145 lb 4.5 oz)-69.1 kg (152 lb 5.4 oz)] 65.9 kg (145 lb 4.5 oz) (05/07 1722)  Weight change:  Filed Weights   11/21/16 1430 11/23/16 1429 11/23/16 1722  Weight: 68.5 kg (151 lb 0.2 oz) 69.1 kg (152 lb 5.4 oz) 65.9 kg (145 lb 4.5 oz)    Intake/Output: I/O last 3 completed shifts: In: 360 [P.O.:360] Out: -    Intake/Output this shift:  Total I/O In: 2880 [P.O.:2880] Out: 890 [Other:890]  Physical Exam: General: NAD,   Head: Normocephalic, atraumatic. Moist oral mucosal membranes  Eyes: Anicteric,   Neck: Supple, trachea midline  Lungs:  Clear to auscultation  Heart: Regular rate and rhythm  Abdomen:  Soft, nontender,   Extremities: no peripheral edema.  Neurologic: Nonfocal, moving all four extremities  Skin: No lesions  Access: LIJ permcath 5/4 Barbara. Lucky [name]    Basic Metabolic Panel:  Recent Labs Lab 11/19/16 1544 11/20/16 0605 11/21/16 0534 11/23/16 1423  NA 135 135 132* 132*  K 4.9 5.2* 4.7 4.8  CL 111 115* 107 99*  CO2 13* 13* 16* 24  GLUCOSE 105* 92 92 104*  BUN 78* 80* 67* 59*  CREATININE 6.99* 7.02* 6.52* 7.27*  CALCIUM 8.6* 8.5* 8.2* 8.4*  PHOS  --   --   --  4.9*    Liver Function Tests:  Recent Labs Lab 11/23/16 1423  ALBUMIN 2.7*   No results for input(s): LIPASE, AMYLASE in the last 168 hours. No results for input(s): AMMONIA in the last 168 hours.  CBC:  Recent Labs Lab 11/20/16 0605 11/21/16 0534 11/23/16 1423  WBC 8.2 10.2 9.9  HGB 8.7* 9.3* 8.5*  HCT 27.4* 28.4* 25.4*  MCV 93.1 91.4 90.5   PLT 267 204 180    Cardiac Enzymes: No results for input(s): CKTOTAL, CKMB, CKMBINDEX, TROPONINI in the last 168 hours.  BNP: Invalid input(s): POCBNP  CBG:  Recent Labs Lab 11/22/16 1134 11/22/16 1633 11/22/16 2127 11/23/16 0724 11/23/16 1144  GLUCAP 104* 121* 112* 102* 100*    Microbiology: Results for orders placed or performed during the hospital encounter of 10/02/16  Urine culture     Status: Abnormal   Collection Time: 10/02/16  9:40 AM  Result Value Ref Range Status   Specimen Description URINE, CLEAN CATCH  Final   Special Requests NONE  Final   Culture MULTIPLE SPECIES PRESENT, SUGGEST RECOLLECTION (A)  Final   Report Status 10/03/2016 FINAL  Final    Coagulation Studies: No results for input(s): LABPROT, INR in the last 72 hours.  Urinalysis: No results for input(s): COLORURINE, LABSPEC, PHURINE, GLUCOSEU, HGBUR, BILIRUBINUR, KETONESUR, PROTEINUR, UROBILINOGEN, NITRITE, LEUKOCYTESUR in the last 72 hours.  Invalid input(s): APPERANCEUR    Imaging: No results found.   Medications:    . atorvastatin  40 mg Oral QPM  . calcium acetate  1,334 mg Oral TID WC  . cholecalciferol  1,000 Units Oral Daily  . ferrous sulfate  325 mg Oral Daily  . gabapentin  100 mg Oral TID  .  heparin subcutaneous  5,000 Units Subcutaneous Q8H  . insulin aspart  0-5 Units Subcutaneous QHS  . insulin aspart  0-9 Units Subcutaneous TID WC  . latanoprost  1 drop Both Eyes QHS  . mirabegron ER  25 mg Oral Daily  . oxybutynin  5 mg Oral TID  . pantoprazole  40 mg Oral Daily   acetaminophen **OR** acetaminophen, albuterol, heparin lock flush, heparin lock flush, heparin lock flush, lidocaine, ondansetron **OR** ondansetron (ZOFRAN) IV, sodium chloride flush, sodium chloride flush, sodium chloride flush  Assessment/ Plan:  [name] is a 76 y.o. black female with hypertension, diabetes type 2, hyperlipidemia, osteoarthritis, COPD, obstructive sleep apnea, obesity,  bladder cancer.  1. End Stage Renal Disease with hyperkalemia and metabolic acidosis. With uremic symptoms.  Tunneled dialysis catheter placed.  First dialysis 11/20/16  - PPD to be read on 5/6 - Pending vein mapping.  - Outpatient planning for [facility].  - orders given to dialysis coordinator  2. Hypertension. Blood pressure well-controlled   - currently not on any blood pressure agents.   3. Anemia of chronic kidney disease: hemoglobin 8.5 Hematology has cleared patient to get ESA therapy - EPO with HD treatment tomorrow.   4. Bladder Cancer: s/p TURBT and radiation therapy/chemo with gemcitabine. - Followed by Barbara [name] and Barbara [name]  5. Secondary Hyperparathyroidism with hyperphosphatemia: PTH 191 on 4/30.  - calcium aceate for binding.    LOS: 4 Barbara [name] 5/7/20185:53 PM

## 2016-11-23 NOTE — Care Management Important Message (Signed)
Important Message  Patient Details  Name: Barbara [name] MRN: [id number] Date of Birth: 12/19/1940   Medicare Important Message Given:  Yes    [name], RN 11/23/2016, 2:32 PM

## 2016-11-23 NOTE — Progress Notes (Signed)
Pt A and O x 4. VSS. Pt tolerating diet well. No complaints of pain or nausea. IV removed intact, prescriptions given. Pt voiced understanding of discharge instructions with no further questions. Pt given chair time for dialysis day. Pt discharged via wheelchair with nurse aide.

## 2016-11-23 NOTE — Progress Notes (Signed)
Physical Therapy Treatment Patient Details Name: Barbara [name] MRN: [id number] DOB: [birth year] Today's Date: 11/23/2016    History of Present Illness 76 yo female with onset of weakness and acute kidney injury, has new HD shunt and starting HD. Has had L thigh pain since last several months, impacting gait.  PMHx:  metabolic acidosis, bladder CA, DM, CKD 5, neuropathy,     PT Comments    Pt initially declines this am but on second attempt, agrees with encouragement from family and nurse.  Bed mobility without assist.  She was able to stand and ambulate 82' with SPC with slow and cautious gait.  She takes frequent standing rest breaks due to R knee soreness but not buckling or overt LOB's noted.  Gait however was limited by knee discomfort.  Discussed using RW to allow increased functional mobility but she is resistant at this time.  She does state she has a RW and wheelchair at home along with her [facility] to use as needed.   Follow Up Recommendations  Home health PT;Supervision for mobility/OOB     Equipment Recommendations       Recommendations for Other Services       Precautions / Restrictions Precautions Precautions: Fall Restrictions Weight Bearing Restrictions: No    Mobility  Bed Mobility Overal bed mobility: Modified Independent                Transfers Overall transfer level: Modified independent Equipment used: Straight cane Transfers: Sit to/from Stand Sit to Stand: Supervision            Ambulation/Gait Ambulation/Gait assistance: Supervision;Min guard Ambulation Distance (Feet): 60 Feet Assistive device: Straight cane Gait Pattern/deviations: Step-through pattern Gait velocity: reduced Gait velocity interpretation: Below normal speed for age/gender General Gait Details: decreaseed step length bialterally,  frequent standing rests due to R knee   Stairs            Wheelchair Mobility    Modified Rankin (Stroke Patients Only)        Balance Overall balance assessment: Needs assistance Sitting-balance support: Feet supported Sitting balance-Leahy Scale: Good     Standing balance support: Single extremity supported Standing balance-Leahy Scale: Fair                              Cognition Arousal/Alertness: Awake/alert Behavior During Therapy: WFL for tasks assessed/performed Overall Cognitive Status: Within Functional Limits for tasks assessed                                        Exercises      General Comments        Pertinent Vitals/Pain Pain Assessment: No/denies pain Pain Location: pain L knee with gait Pain Descriptors / Indicators: Aching;Sore Pain Intervention(s): Monitored during session    Home Living                      Prior Function            PT Goals (current goals can now be found in the care plan section) Progress towards PT goals: Progressing toward goals    Frequency    Min 2X/week      PT Plan Current plan remains appropriate    Co-evaluation              AM-PAC PT "6 Clicks"  Daily Activity  Outcome Measure  Difficulty turning over in bed (including adjusting bedclothes, sheets and blankets)?: None Difficulty moving from lying on back to sitting on the side of the bed? : None Difficulty sitting down on and standing up from a chair with arms (e.g., wheelchair, bedside commode, etc,.)?: None Help needed moving to and from a bed to chair (including a wheelchair)?: A Little Help needed walking in hospital room?: A Little Help needed climbing 3-5 steps with a railing? : A Little 6 Click Score: 21    End of Session Equipment Utilized During Treatment: Gait belt Activity Tolerance: Patient tolerated treatment well Patient left: in bed;with bed alarm set;with call bell/phone within reach   Pain - Right/Left: Left Pain - part of body: Leg     Time: 3143-8887 PT Time Calculation (min) (ACUTE ONLY): 18 min  Charges:   $Gait Training: 8-22 mins                    G Codes:       Chesley Noon, PTA 11/23/16, 10:43 AM

## 2016-12-14 ENCOUNTER — Ambulatory Visit: Payer: Medicare Other

## 2016-12-16 ENCOUNTER — Telehealth: Payer: Self-pay | Admitting: *Deleted

## 2016-12-16 ENCOUNTER — Ambulatory Visit: Payer: Medicare Other

## 2016-12-16 NOTE — Telephone Encounter (Signed)
When patient arrived for PET, she was asked to pay co pay for PET and she refused then left without getting scan done

## 2016-12-16 NOTE — Telephone Encounter (Signed)
RN Notified by [facility] registration. Patient showed up at registration desk this morning. States that she will not proceed with the pet scan as she "doesn't have any money to pay for the out of pocket costs." She also explained to registration desk that she "did not understand why the pet scan was being ordered."   Call attempt to patient's cell. No answer. Left msg.  Attempted call attempt at 1224 again to patient's residence. No answer. Not able to leave vm.

## 2016-12-17 ENCOUNTER — Inpatient Hospital Stay: Payer: Medicare Other | Admitting: Internal Medicine

## 2016-12-17 NOTE — Telephone Encounter (Signed)
Left vm -patient home phone asking pt to return my phone call.  Also called cell phone that was listed. - left msg for patient's husband Jeneen Rinks to contact our office as soon as possible

## 2016-12-18 NOTE — Telephone Encounter (Signed)
Spoke with patient's husband Jeneen Rinks and patient. She is now agreeable to r/s the pet scan and r/s apt with (covering provider for results). she is unable to perform pet scan on Tuesd. Thursday due to dialysis. I informed pt that if she decides to cnl this procedure that she needs to notify our office 72 hrs prior to the apt date as this would give the nuc med dept ample time to cnl the orders.  I explained to her that the doctor would not be able to tell her any updates about her bladder without the pet scan.  I explained that plenty of notice is needed due to the high cost of the radioactive tracer material of the nuc med scan.  She gave verbal understanding./teach back process performed.  msg sent to sch. Team to r/s pet and md apts.

## 2017-01-01 ENCOUNTER — Ambulatory Visit
Admission: RE | Admit: 2017-01-01 | Discharge: 2017-01-01 | Disposition: A | Discharge status: Home / Self Care | Payer: Medicare Other | Source: Ambulatory Visit | Attending: Internal Medicine | Admitting: Internal Medicine

## 2017-01-01 DIAGNOSIS — I700 Atherosclerosis of aorta: Secondary | ICD-10-CM | POA: Diagnosis not present

## 2017-01-01 DIAGNOSIS — C670 Malignant neoplasm of trigone of bladder: Secondary | ICD-10-CM | POA: Insufficient documentation

## 2017-01-01 DIAGNOSIS — I2510 Atherosclerotic heart disease of native coronary artery without angina pectoris: Secondary | ICD-10-CM | POA: Insufficient documentation

## 2017-01-01 DIAGNOSIS — N1330 Unspecified hydronephrosis: Secondary | ICD-10-CM | POA: Insufficient documentation

## 2017-01-01 LAB — GLUCOSE, CAPILLARY: GLUCOSE-CAPILLARY: 86 mg/dL (ref 65–99)

## 2017-01-01 MED ORDER — FLUDEOXYGLUCOSE F - 18 (FDG) INJECTION
12.2400 | Freq: Once | INTRAVENOUS | Status: AC | PRN
  Administered 2017-01-01: 12.24 via INTRAVENOUS

## 2017-01-08 ENCOUNTER — Inpatient Hospital Stay: Payer: Medicare Other

## 2017-01-08 ENCOUNTER — Encounter: Payer: Self-pay | Admitting: Oncology

## 2017-01-08 ENCOUNTER — Inpatient Hospital Stay: Payer: Medicare Other | Attending: Oncology | Admitting: Oncology

## 2017-01-08 VITALS — BP 97/57 | HR 87 | Temp 97.8°F | Resp 18 | Ht 68.0 in | Wt 148.0 lb

## 2017-01-08 DIAGNOSIS — Z923 Personal history of irradiation: Secondary | ICD-10-CM

## 2017-01-08 DIAGNOSIS — N179 Acute kidney failure, unspecified: Secondary | ICD-10-CM | POA: Insufficient documentation

## 2017-01-08 DIAGNOSIS — Z9221 Personal history of antineoplastic chemotherapy: Secondary | ICD-10-CM | POA: Diagnosis not present

## 2017-01-08 DIAGNOSIS — K219 Gastro-esophageal reflux disease without esophagitis: Secondary | ICD-10-CM | POA: Insufficient documentation

## 2017-01-08 DIAGNOSIS — E1122 Type 2 diabetes mellitus with diabetic chronic kidney disease: Secondary | ICD-10-CM | POA: Diagnosis not present

## 2017-01-08 DIAGNOSIS — I129 Hypertensive chronic kidney disease with stage 1 through stage 4 chronic kidney disease, or unspecified chronic kidney disease: Secondary | ICD-10-CM | POA: Diagnosis not present

## 2017-01-08 DIAGNOSIS — Z79899 Other long term (current) drug therapy: Secondary | ICD-10-CM | POA: Insufficient documentation

## 2017-01-08 DIAGNOSIS — G4730 Sleep apnea, unspecified: Secondary | ICD-10-CM | POA: Diagnosis not present

## 2017-01-08 DIAGNOSIS — Z9981 Dependence on supplemental oxygen: Secondary | ICD-10-CM | POA: Insufficient documentation

## 2017-01-08 DIAGNOSIS — J449 Chronic obstructive pulmonary disease, unspecified: Secondary | ICD-10-CM | POA: Diagnosis not present

## 2017-01-08 DIAGNOSIS — C670 Malignant neoplasm of trigone of bladder: Secondary | ICD-10-CM | POA: Diagnosis present

## 2017-01-08 DIAGNOSIS — D631 Anemia in chronic kidney disease: Secondary | ICD-10-CM | POA: Diagnosis not present

## 2017-01-08 DIAGNOSIS — Z87891 Personal history of nicotine dependence: Secondary | ICD-10-CM | POA: Insufficient documentation

## 2017-01-08 DIAGNOSIS — Z992 Dependence on renal dialysis: Secondary | ICD-10-CM | POA: Insufficient documentation

## 2017-01-08 LAB — CBC WITH DIFFERENTIAL/PLATELET
BASOS PCT: 1 %
Basophils Absolute: 0 10*3/uL (ref 0–0.1)
Eosinophils Absolute: 0.1 10*3/uL (ref 0–0.7)
Eosinophils Relative: 2 %
HEMATOCRIT: 30.8 % — AB (ref 35.0–47.0)
Hemoglobin: 10.1 g/dL — ABNORMAL LOW (ref 12.0–16.0)
Lymphocytes Relative: 24 %
Lymphs Abs: 1.6 10*3/uL (ref 1.0–3.6)
MCH: 31.6 pg (ref 26.0–34.0)
MCHC: 32.8 g/dL (ref 32.0–36.0)
MCV: 96.3 fL (ref 80.0–100.0)
MONO ABS: 0.6 10*3/uL (ref 0.2–0.9)
Monocytes Relative: 9 %
NEUTROS ABS: 4.2 10*3/uL (ref 1.4–6.5)
Neutrophils Relative %: 64 %
PLATELETS: 221 10*3/uL (ref 150–440)
RBC: 3.2 MIL/uL — ABNORMAL LOW (ref 3.80–5.20)
RDW: 17.2 % — ABNORMAL HIGH (ref 11.5–14.5)
WBC: 6.5 10*3/uL (ref 3.6–11.0)

## 2017-01-08 LAB — IRON AND TIBC
IRON: 38 ug/dL (ref 28–170)
Saturation Ratios: 15 % (ref 10.4–31.8)
TIBC: 253 ug/dL (ref 250–450)
UIBC: 215 ug/dL

## 2017-01-08 LAB — COMPREHENSIVE METABOLIC PANEL
ALK PHOS: 86 U/L (ref 38–126)
ALT: 14 U/L (ref 14–54)
ANION GAP: 13 (ref 5–15)
AST: 20 U/L (ref 15–41)
Albumin: 3.8 g/dL (ref 3.5–5.0)
BUN: 42 mg/dL — ABNORMAL HIGH (ref 6–20)
CALCIUM: 8.9 mg/dL (ref 8.9–10.3)
CHLORIDE: 103 mmol/L (ref 101–111)
CO2: 21 mmol/L — AB (ref 22–32)
Creatinine, Ser: 5.12 mg/dL — ABNORMAL HIGH (ref 0.44–1.00)
GFR calc non Af Amer: 7 mL/min — ABNORMAL LOW (ref >60)
GFR, EST AFRICAN AMERICAN: 9 mL/min — AB (ref >60)
Glucose, Bld: 96 mg/dL (ref 65–99)
POTASSIUM: 4 mmol/L (ref 3.5–5.1)
SODIUM: 137 mmol/L (ref 135–145)
Total Bilirubin: 0.5 mg/dL (ref 0.3–1.2)
Total Protein: 8.8 g/dL — ABNORMAL HIGH (ref 6.5–8.1)

## 2017-01-08 LAB — FERRITIN: Ferritin: 501 ng/mL — ABNORMAL HIGH (ref 11–307)

## 2017-01-08 NOTE — Progress Notes (Signed)
patient here for bladder cancer follow-up. She has no medical complaints. She is here to discuss her ct scan results.

## 2017-01-08 NOTE — Progress Notes (Signed)
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 TURBT- Tumor invades muscularis propria [detrusor muscle]. NOT candidate for cystectomy.  # SEP 2017- RT- GEM weekly.  # Acute Renal failure/CKD IV / C.diff [aug 2017]     Cancer of trigone of urinary bladder ([place])   03/02/2016 Initial Diagnosis    Cancer of trigone of urinary bladder (HCC)        Interval history- Patient was seen by nephrology and has been on hemodialysis since 2 months. Reports chronic fatigue. Also has right shoulder pain since 2 days which is improving  ECOG PS- 2 Pain scale- 3   Review of systems- Review of Systems  Constitutional: Positive for malaise/fatigue. Negative for chills, fever and weight loss.  HENT: Negative for congestion, ear discharge and nosebleeds.   Eyes: Negative for blurred vision.  Respiratory: Negative for cough, hemoptysis, sputum production, shortness of breath and wheezing.   Cardiovascular: Negative for chest pain, palpitations, orthopnea and claudication.  Gastrointestinal: Negative for abdominal pain, blood in stool, constipation, diarrhea, heartburn, melena, nausea and vomiting.  Genitourinary: Negative for dysuria, flank pain, frequency, hematuria and urgency.  Musculoskeletal: Positive for joint pain. Negative for back pain and myalgias.  Skin: Negative for rash.    Neurological: Negative for dizziness, tingling, focal weakness, seizures, weakness and headaches.  Endo/Heme/Allergies: Does not bruise/bleed easily.  Psychiatric/Behavioral: Negative for depression and suicidal ideas. The patient does not have insomnia.        Allergies  Allergen Reactions  . Glipizide Er     Dropped blood sugar too much  . Percocet [Oxycodone-Acetaminophen] Hives     Past Medical History:  Diagnosis Date  . Anemia associated with chronic renal failure 2017   blood transfusion last week 10/17  . Arthritis   . Cancer ([place]) 2017   bladder  . Chronic kidney disease   . 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  dr. Candiss Norse and dr. Holley Raring follow her  . COPD (chronic obstructive pulmonary disease) ([place])   . Diabetes mellitus without complication ([place])   . Dyspnea    with exertion  . Elevated lipids   . GERD (gastroesophageal reflux disease)   . Hematuria   . Hypertension   . Lower back pain   . Oxygen dependent    at hs  . Personal history of chemotherapy   . Sleep apnea    uses cpap  . Urinary obstruction 01/2016     Past Surgical History:  Procedure Laterality Date  . ABDOMINAL HYSTERECTOMY    . CYSTOSCOPY W/ RETROGRADES Bilateral 02/17/2016   Procedure: CYSTOSCOPY WITH RETROGRADE PYELOGRAM;  Surgeon: Hollice Espy, MD;  Location: [facility];  Service: Urology;  Laterality: Bilateral;  . CYSTOSCOPY W/ RETROGRADES Bilateral 10/12/2016   Procedure: CYSTOSCOPY WITH RETROGRADE PYELOGRAM;  Surgeon: Hollice Espy, MD;  Location: [facility];  Service: Urology;  Laterality: Bilateral;  . CYSTOSCOPY W/ URETERAL STENT PLACEMENT Left  05/12/2016   Procedure: CYSTOSCOPY WITH STENT REPLACEMENT;  Surgeon: Hollice Espy, MD;  Location: [facility];  Service: Urology;  Laterality: Left;  . CYSTOSCOPY W/ URETERAL STENT PLACEMENT Left 10/12/2016   Procedure: CYSTOSCOPY WITH STENT REPLACEMENT;  Surgeon: Hollice Espy, MD;  Location: [facility];  Service: Urology;   Laterality: Left;  . CYSTOSCOPY WITH STENT PLACEMENT Left 01/21/2016   Procedure: CYSTOSCOPY WITH double J STENT PLACEMENT;  Surgeon: Franchot Gallo, MD;  Location: [facility];  Service: Urology;  Laterality: Left;  . CYSTOSCOPY WITH STENT PLACEMENT Right 10/12/2016   Procedure: CYSTOSCOPY WITH STENT PLACEMENT;  Surgeon: Hollice Espy, MD;  Location: [facility];  Service: Urology;  Laterality: Right;  . DIALYSIS/PERMA CATHETER INSERTION N/A 11/20/2016   Procedure: Dialysis/Perma Catheter Insertion;  Surgeon: Algernon Huxley, MD;  Location: [facility];  Service: Cardiovascular;  Laterality: N/A;  . KIDNEY SURGERY  01/21/2016   IR NEPHROSTOMY PLACEMENT LEFT   . PERIPHERAL VASCULAR CATHETERIZATION N/A 04/07/2016   Procedure: Glori Luis Cath Insertion;  Surgeon: Katha Cabal, MD;  Location: [place] [facility];  Service: Cardiovascular;  Laterality: N/A;  . ROTATOR CUFF REPAIR     both sides  . TRANSURETHRAL RESECTION OF BLADDER TUMOR N/A 02/17/2016   Procedure: TRANSURETHRAL RESECTION OF BLADDER TUMOR (TURBT)-LARGE;  Surgeon: Hollice Espy, MD;  Location: [facility];  Service: Urology;  Laterality: N/A;  . URETEROSCOPY Left 02/17/2016   Procedure: URETEROSCOPY;  Surgeon: Hollice Espy, MD;  Location: [facility];  Service: Urology;  Laterality: Left;  . URETEROSCOPY Right 10/12/2016   Procedure: URETEROSCOPY;  Surgeon: Hollice Espy, MD;  Location: [facility];  Service: Urology;  Laterality: Right;    Social History   Social History  . Marital status: Married    Spouse name: N/A  . Number of children: N/A  . Years of education: N/A   Occupational History  . retired    Social History Main Topics  . Smoking status: Former Smoker    Types: Cigarettes    Quit date: 07/20/2002  . Smokeless tobacco: Never Used     Comment: 01/22/2016   "  quit smoking many years ago "  . Alcohol use No  . Drug use: No  . Sexual activity: No   Other Topics Concern  . Not on file   Social History  Narrative  . No narrative on file    Family History  Problem Relation Age of Onset  . Diabetes Mother   . Cancer Father   . Breast cancer Neg Hx   . Kidney disease Neg Hx      Current Outpatient Prescriptions:  .  Albuterol Sulfate (PROAIR RESPICLICK) 831 (90 Base) MCG/ACT AEPB, Inhale 2 puffs into the lungs 4 (four) times daily as needed (wheezing). , Disp: , Rfl:  .  atorvastatin (LIPITOR) 40 MG tablet, Take 40 mg by mouth every evening. , Disp: , Rfl:  .  calcium acetate (PHOSLO) 667 MG capsule, Take 2 capsules (1,334 mg total) by mouth 3 (three) times daily with meals., Disp: 90 capsule, Rfl: 0 .  cholecalciferol (VITAMIN D) 1000 units tablet, Take 1,000 Units by mouth daily., Disp: , Rfl:  .  esomeprazole (NEXIUM) 40 MG capsule, Take 40 mg by mouth daily at 12 noon., Disp: , Rfl:  .  ferrous sulfate 325 (65 FE) MG EC tablet, Take 325 mg by mouth daily., Disp: , Rfl:  .  gabapentin (NEURONTIN) 300 MG capsule, Take 600 mg by mouth 3 (three) times daily., Disp: ,  Rfl:  .  lidocaine (XYLOCAINE) 5 % ointment, Apply 1 application topically daily as needed for mild pain. , Disp: , Rfl:  .  MYRBETRIQ 25 MG TB24 tablet, TAKE 1 TABLET (25 MG TOTAL) BY MOUTH DAILY., Disp: 30 tablet, Rfl: 5 .  NON FORMULARY, Place 1 Units into the nose at bedtime. CPAP Time of use 2100-0600, Disp: , Rfl:  .  oxybutynin (DITROPAN) 5 MG tablet, Take 5 mg by mouth 3 (three) times daily., Disp: , Rfl:  .  OXYGEN, Inhale 2 L into the lungs at bedtime., Disp: , Rfl:  .  TRAVATAN Z 0.004 % SOLN ophthalmic solution, PLACE 1 DROP INTO BOTH EYES EVERY NIGHT AT BEDTIME, Disp: , Rfl: 5 .  acetaminophen (TYLENOL) 500 MG tablet, Take 500 mg by mouth every 6 (six) hours as needed for moderate pain or headache., Disp: , Rfl:   Physical exam:  Vitals:   01/08/17 1112  BP: (!) 97/57  Pulse: 87  Resp: 18  Temp: 97.8 F (36.6 C)  TempSrc: Tympanic  Weight: 148 lb (67.1 kg)  Height: 5' 8"  (1.727 m)   Physical Exam   Constitutional: She is oriented to person, place, and time.  Elderly, in no acute distress  HENT:  Head: Normocephalic and atraumatic.  Eyes: EOM are normal. Pupils are equal, round, and reactive to light.  Neck: Normal range of motion.  Cardiovascular: Normal rate, regular rhythm and normal heart sounds.   Pulmonary/Chest: Effort normal and breath sounds normal.  Abdominal: Soft. Bowel sounds are normal.  Neurological: She is alert and oriented to person, place, and time.  Skin: Skin is warm and dry.     CMP Latest Ref Rng & Units 01/08/2017  Glucose 65 - 99 mg/dL 96  BUN 6 - 20 mg/dL 42(H)  Creatinine 0.44 - 1.00 mg/dL 5.12(H)  Sodium 135 - 145 mmol/L 137  Potassium 3.5 - 5.1 mmol/L 4.0  Chloride 101 - 111 mmol/L 103  CO2 22 - 32 mmol/L 21(L)  Calcium 8.9 - 10.3 mg/dL 8.9  Total Protein 6.5 - 8.1 g/dL 8.8(H)  Total Bilirubin 0.3 - 1.2 mg/dL 0.5  Alkaline Phos 38 - 126 U/L 86  AST 15 - 41 U/L 20  ALT 14 - 54 U/L 14   CBC Latest Ref Rng & Units 01/08/2017  WBC 3.6 - 11.0 K/uL 6.5  Hemoglobin 12.0 - 16.0 g/dL 10.1(L)  Hematocrit 35.0 - 47.0 % 30.8(L)  Platelets 150 - 440 K/uL 221    No images are attached to the encounter.  Nm Pet Image Restag (ps) Skull Base To Thigh  Result Date: 01/01/2017 CLINICAL DATA:  Subsequent treatment strategy for bladder cancer. EXAM: NUCLEAR MEDICINE PET SKULL BASE TO THIGH TECHNIQUE: Twelve point to mCi F-18 FDG was injected intravenously. Full-ring PET imaging was performed from the skull base to thigh after the radiotracer. CT data was obtained and used for attenuation correction and anatomic localization. FASTING BLOOD GLUCOSE:  Value: 86 mg/dl COMPARISON:  CT chest abdomen pelvis 07/10/2016. FINDINGS: NECK No hypermetabolic lymph nodes in the neck. CT images show no acute findings. CHEST No hypermetabolic mediastinal, hilar or axillary lymph nodes. No hypermetabolic pulmonary nodules. Right IJ Port-A-Cath terminates at the SVC RA junction. Left  IJ dialysis catheter terminates in the SVC. Atherosclerotic calcification of the arterial vasculature, including coronary arteries. Heart is mildly enlarged. No pericardial or pleural effusion. ABDOMEN/PELVIS No abnormal hypermetabolism in the liver, adrenal glands, spleen or pancreas. No hypermetabolic lymph nodes. Liver, gallbladder and adrenal  glands are grossly unremarkable. Double-J ureteral stents are seen in the renal collecting systems bilaterally with mild to moderate hydronephrosis bilaterally. Bladder is markedly thickened. Stomach and bowel are grossly unremarkable. Atherosclerotic calcification of the arterial vasculature without abdominal aortic aneurysm. SKELETON No abnormal osseous hypermetabolism. Degenerative changes in the spine. IMPRESSION: 1. Marked bladder wall thickening with bilateral hydronephrosis and bilateral double-J ureteral stents in place. No evidence of metastatic disease. 2. Aortic atherosclerosis (ICD10-170.0). Coronary artery calcification. Electronically Signed   By: Lorin Picket M.D.   On: 01/01/2017 13:30     Assessment and plan- Patient is a 76 y.o. female with muscle invasive bladder cancer s/o chemo/RT with gemcitabine  PET CT reviewed with the patient. No evidence of metastatic disease. Bladder thickening was noted and patient will be getting cyctoscopy next week with DR. Brandon  CKD- on dialysis  Anemia likely due to chronic kidney disease- gradually improving. No procrit given h/o malignancy. Repeat cbc and iron studies today  rtc in 2 months with cbc and iron studies to see DR. Brahmanday for folow up of her anemia   Visit Diagnosis 1. Cancer of trigone of urinary bladder ([place])      Dr. Randa Evens, MD, MPH [facility] Pager- [phone] 01/08/2017 3:50 PM

## 2017-01-13 ENCOUNTER — Ambulatory Visit (INDEPENDENT_AMBULATORY_CARE_PROVIDER_SITE_OTHER): Payer: Medicare Other | Admitting: Urology

## 2017-01-13 ENCOUNTER — Encounter: Payer: Self-pay | Admitting: Urology

## 2017-01-13 VITALS — BP 116/62 | HR 72 | Ht 68.0 in | Wt 149.0 lb

## 2017-01-13 DIAGNOSIS — C679 Malignant neoplasm of bladder, unspecified: Secondary | ICD-10-CM

## 2017-01-13 LAB — URINALYSIS, COMPLETE
BILIRUBIN UA: NEGATIVE
Glucose, UA: NEGATIVE
Ketones, UA: NEGATIVE
NITRITE UA: NEGATIVE
PH UA: 8.5 — AB (ref 5.0–7.5)
Specific Gravity, UA: 1.015 (ref 1.005–1.030)
UUROB: 0.2 mg/dL (ref 0.2–1.0)

## 2017-01-13 LAB — MICROSCOPIC EXAMINATION: Epithelial Cells (non renal): NONE SEEN /hpf (ref 0–10)

## 2017-01-13 MED ORDER — CIPROFLOXACIN HCL 500 MG PO TABS
500.0000 mg | ORAL_TABLET | Freq: Once | ORAL | Status: AC
  Administered 2017-01-13: 500 mg via ORAL

## 2017-01-13 MED ORDER — LIDOCAINE HCL 2 % EX GEL
1.0000 "application " | Freq: Once | CUTANEOUS | Status: AC
  Administered 2017-01-13: 1 via URETHRAL

## 2017-01-13 NOTE — Progress Notes (Signed)
   01/13/17  CC:  Chief Complaint  Patient presents with  . Cysto Stent Removal    HPI: 76 year old female with history of muscle invasive bladder cancer diagnosed in 01/2015 status post TURBT followed by palliative gemcitabine and concurrent radiation.    Since her last visit, she's been started on hemodialysis for progressive chronic kidney disease. She is been tolerating dialysis well, Tuesday Thursday and Saturday.  She also underwent recent CT PET scan on 01/01/2017 which shows marked bladder wall thickening and bilateral hydronephrosis despite ureteral stents in place. There is no evidence of metastatic disease. She is being followed closely by the cancer center as well.  She was last taken to the operating room on 10/12/2016 at which time the bladder was noted to be diffusely erythematous, small in capacity with newly identified right hydroureteronephrosis down to level of the distal ureter with a negative ureteroscopy. This possibly related to radiation changes of the distal ureter. Bilateral ureteral stents replaced at that time.  She returns to the office today for cystoscopy, stent removal.    She does continue to have urinary frequency, urgency, and intermittent gross hematuria.  Blood pressure 129/62, pulse 84, height 5' 8"  (1.727 m), weight 161 lb 14.4 oz (73.4 kg). NED. A&Ox3.   No respiratory distress   Abd soft, NT, ND Normal external genitalia with patent urethral meatus  Cystoscopy Procedure Note  Patient identification was confirmed, informed consent was obtained, and patient was prepped using Betadine solution.  Lidocaine jelly was administered per urethral meatus.    Preoperative abx where received prior to procedure.    Procedure: - Flexible cystoscope introduced, without any difficulty.   - Thorough search of the bladder revealed:    normal urethral meatus -Bilateral ureteral stents were identified, removed without difficulty using stent graspers -Small  clot on the end of the stent was removed with one of the stents -Bladder appeared to be diffusely erythematous as on previous cystoscopy in the operating room but no obvious papillary tumor -On reinspection of the bladder after stents removed, visualization was suboptimal, UOs appeared capacious   Post-Procedure: - Cystoscopy not particularly well tolerated today at bedside  Assessment/ Plan:  1. Malignant neoplasm of urinary bladder, unspecified site (HCC) Diffuse erythema, visualization suboptimal today Thickened bladder wall on PET scan likely related to radiation change and chronic inflammation from stents/surgery Recommend repeat cystoscopy in one month following stent removal today is below No other evidence of metastatic disease - Urinalysis, Complete - ciprofloxacin (CIPRO) tablet 500 mg; Take 1 tablet (500 mg total) by mouth once. - lidocaine (XYLOCAINE) 2 % jelly 1 application; Place 1 application into the urethra once. - Microscopic Examination  2. Hydronephrosis Likely related to bilateral radiation changes of the ureter/bladder Given that she is now on hemodialysis and thus no further role for renal optimization, recommend stent removal which was performed today  Recommend follow-up with repeat cystoscopy in one month after stent removal given that visualization of the bladder today was suboptimal  Hollice Espy, MD

## 2017-01-24 ENCOUNTER — Inpatient Hospital Stay
Admission: EM | Admit: 2017-01-24 | Discharge: 2017-01-29 | DRG: 862 | Disposition: A | Discharge status: Home Health | Payer: Medicare Other | Attending: Internal Medicine | Admitting: Internal Medicine

## 2017-01-24 ENCOUNTER — Emergency Department: Payer: Medicare Other

## 2017-01-24 ENCOUNTER — Encounter: Payer: Self-pay | Admitting: Emergency Medicine

## 2017-01-24 DIAGNOSIS — Z9221 Personal history of antineoplastic chemotherapy: Secondary | ICD-10-CM

## 2017-01-24 DIAGNOSIS — K5900 Constipation, unspecified: Secondary | ICD-10-CM | POA: Diagnosis present

## 2017-01-24 DIAGNOSIS — Y838 Other surgical procedures as the cause of abnormal reaction of the patient, or of later complication, without mention of misadventure at the time of the procedure: Secondary | ICD-10-CM | POA: Diagnosis present

## 2017-01-24 DIAGNOSIS — J449 Chronic obstructive pulmonary disease, unspecified: Secondary | ICD-10-CM | POA: Diagnosis present

## 2017-01-24 DIAGNOSIS — R32 Unspecified urinary incontinence: Secondary | ICD-10-CM | POA: Diagnosis present

## 2017-01-24 DIAGNOSIS — N2581 Secondary hyperparathyroidism of renal origin: Secondary | ICD-10-CM | POA: Diagnosis present

## 2017-01-24 DIAGNOSIS — E875 Hyperkalemia: Secondary | ICD-10-CM | POA: Diagnosis present

## 2017-01-24 DIAGNOSIS — T814XXA Infection following a procedure, initial encounter: Principal | ICD-10-CM | POA: Diagnosis present

## 2017-01-24 DIAGNOSIS — N186 End stage renal disease: Secondary | ICD-10-CM | POA: Diagnosis present

## 2017-01-24 DIAGNOSIS — R509 Fever, unspecified: Secondary | ICD-10-CM | POA: Diagnosis not present

## 2017-01-24 DIAGNOSIS — N185 Chronic kidney disease, stage 5: Secondary | ICD-10-CM

## 2017-01-24 DIAGNOSIS — E1122 Type 2 diabetes mellitus with diabetic chronic kidney disease: Secondary | ICD-10-CM | POA: Diagnosis present

## 2017-01-24 DIAGNOSIS — E1140 Type 2 diabetes mellitus with diabetic neuropathy, unspecified: Secondary | ICD-10-CM | POA: Diagnosis present

## 2017-01-24 DIAGNOSIS — G4733 Obstructive sleep apnea (adult) (pediatric): Secondary | ICD-10-CM | POA: Diagnosis present

## 2017-01-24 DIAGNOSIS — Z923 Personal history of irradiation: Secondary | ICD-10-CM

## 2017-01-24 DIAGNOSIS — E876 Hypokalemia: Secondary | ICD-10-CM | POA: Diagnosis present

## 2017-01-24 DIAGNOSIS — A419 Sepsis, unspecified organism: Secondary | ICD-10-CM | POA: Diagnosis present

## 2017-01-24 DIAGNOSIS — I82C11 Acute embolism and thrombosis of right internal jugular vein: Secondary | ICD-10-CM | POA: Diagnosis present

## 2017-01-24 DIAGNOSIS — Z87891 Personal history of nicotine dependence: Secondary | ICD-10-CM

## 2017-01-24 DIAGNOSIS — I120 Hypertensive chronic kidney disease with stage 5 chronic kidney disease or end stage renal disease: Secondary | ICD-10-CM | POA: Diagnosis present

## 2017-01-24 DIAGNOSIS — Z8551 Personal history of malignant neoplasm of bladder: Secondary | ICD-10-CM

## 2017-01-24 DIAGNOSIS — N390 Urinary tract infection, site not specified: Secondary | ICD-10-CM | POA: Diagnosis present

## 2017-01-24 DIAGNOSIS — M25561 Pain in right knee: Secondary | ICD-10-CM | POA: Diagnosis present

## 2017-01-24 DIAGNOSIS — R531 Weakness: Secondary | ICD-10-CM | POA: Diagnosis present

## 2017-01-24 DIAGNOSIS — R197 Diarrhea, unspecified: Secondary | ICD-10-CM | POA: Diagnosis present

## 2017-01-24 DIAGNOSIS — Z992 Dependence on renal dialysis: Secondary | ICD-10-CM | POA: Diagnosis not present

## 2017-01-24 DIAGNOSIS — K219 Gastro-esophageal reflux disease without esophagitis: Secondary | ICD-10-CM | POA: Diagnosis present

## 2017-01-24 DIAGNOSIS — M545 Low back pain: Secondary | ICD-10-CM | POA: Diagnosis present

## 2017-01-24 DIAGNOSIS — E785 Hyperlipidemia, unspecified: Secondary | ICD-10-CM | POA: Diagnosis present

## 2017-01-24 DIAGNOSIS — Z833 Family history of diabetes mellitus: Secondary | ICD-10-CM

## 2017-01-24 DIAGNOSIS — Z959 Presence of cardiac and vascular implant and graft, unspecified: Secondary | ICD-10-CM | POA: Diagnosis not present

## 2017-01-24 DIAGNOSIS — M1990 Unspecified osteoarthritis, unspecified site: Secondary | ICD-10-CM | POA: Diagnosis present

## 2017-01-24 DIAGNOSIS — D631 Anemia in chronic kidney disease: Secondary | ICD-10-CM | POA: Diagnosis present

## 2017-01-24 DIAGNOSIS — G629 Polyneuropathy, unspecified: Secondary | ICD-10-CM | POA: Diagnosis present

## 2017-01-24 DIAGNOSIS — Z9981 Dependence on supplemental oxygen: Secondary | ICD-10-CM

## 2017-01-24 DIAGNOSIS — G8929 Other chronic pain: Secondary | ICD-10-CM | POA: Diagnosis present

## 2017-01-24 LAB — URINALYSIS, COMPLETE (UACMP) WITH MICROSCOPIC
BACTERIA UA: NONE SEEN
Bilirubin Urine: NEGATIVE
GLUCOSE, UA: NEGATIVE mg/dL
KETONES UR: NEGATIVE mg/dL
Nitrite: NEGATIVE
PROTEIN: 100 mg/dL — AB
SQUAMOUS EPITHELIAL / LPF: NONE SEEN
Specific Gravity, Urine: 1.01 (ref 1.005–1.030)
pH: 6 (ref 5.0–8.0)

## 2017-01-24 LAB — CBC
HEMATOCRIT: 34.4 % — AB (ref 35.0–47.0)
HEMOGLOBIN: 11 g/dL — AB (ref 12.0–16.0)
MCH: 31.5 pg (ref 26.0–34.0)
MCHC: 32 g/dL (ref 32.0–36.0)
MCV: 98.3 fL (ref 80.0–100.0)
Platelets: 231 10*3/uL (ref 150–440)
RBC: 3.5 MIL/uL — AB (ref 3.80–5.20)
RDW: 16.5 % — ABNORMAL HIGH (ref 11.5–14.5)
WBC: 11.8 10*3/uL — ABNORMAL HIGH (ref 3.6–11.0)

## 2017-01-24 LAB — COMPREHENSIVE METABOLIC PANEL
ALBUMIN: 3.4 g/dL — AB (ref 3.5–5.0)
ALT: 17 U/L (ref 14–54)
ANION GAP: 17 — AB (ref 5–15)
AST: 24 U/L (ref 15–41)
Alkaline Phosphatase: 76 U/L (ref 38–126)
BUN: 89 mg/dL — ABNORMAL HIGH (ref 6–20)
CHLORIDE: 102 mmol/L (ref 101–111)
CO2: 13 mmol/L — AB (ref 22–32)
Calcium: 8.8 mg/dL — ABNORMAL LOW (ref 8.9–10.3)
Creatinine, Ser: 9.62 mg/dL — ABNORMAL HIGH (ref 0.44–1.00)
GFR calc non Af Amer: 3 mL/min — ABNORMAL LOW (ref >60)
GFR, EST AFRICAN AMERICAN: 4 mL/min — AB (ref >60)
GLUCOSE: 181 mg/dL — AB (ref 65–99)
Potassium: 4.8 mmol/L (ref 3.5–5.1)
SODIUM: 132 mmol/L — AB (ref 135–145)
Total Bilirubin: 0.6 mg/dL (ref 0.3–1.2)
Total Protein: 8.4 g/dL — ABNORMAL HIGH (ref 6.5–8.1)

## 2017-01-24 LAB — LACTIC ACID, PLASMA: LACTIC ACID, VENOUS: 1.3 mmol/L (ref 0.5–1.9)

## 2017-01-24 LAB — LIPASE, BLOOD: LIPASE: 37 U/L (ref 11–51)

## 2017-01-24 MED ORDER — MAGNESIUM CITRATE PO SOLN
1.0000 | Freq: Once | ORAL | Status: DC | PRN
  Filled 2017-01-24: qty 296

## 2017-01-24 MED ORDER — BISACODYL 5 MG PO TBEC
5.0000 mg | DELAYED_RELEASE_TABLET | Freq: Every day | ORAL | Status: DC | PRN
  Administered 2017-01-27 – 2017-01-28 (×2): 5 mg via ORAL
  Filled 2017-01-24 (×2): qty 1

## 2017-01-24 MED ORDER — GABAPENTIN 300 MG PO CAPS
600.0000 mg | ORAL_CAPSULE | Freq: Three times a day (TID) | ORAL | Status: DC
  Administered 2017-01-25 – 2017-01-29 (×13): 600 mg via ORAL
  Filled 2017-01-24 (×13): qty 2

## 2017-01-24 MED ORDER — PIPERACILLIN-TAZOBACTAM 3.375 G IVPB
3.3750 g | Freq: Two times a day (BID) | INTRAVENOUS | Status: DC
  Administered 2017-01-25 – 2017-01-27 (×5): 3.375 g via INTRAVENOUS
  Filled 2017-01-24 (×5): qty 50

## 2017-01-24 MED ORDER — SODIUM CHLORIDE 0.9 % IV SOLN
INTRAVENOUS | Status: DC
  Administered 2017-01-25 (×2): via INTRAVENOUS

## 2017-01-24 MED ORDER — VITAMIN D 1000 UNITS PO TABS
1000.0000 [IU] | ORAL_TABLET | Freq: Every day | ORAL | Status: DC
  Administered 2017-01-25 – 2017-01-29 (×5): 1000 [IU] via ORAL
  Filled 2017-01-24 (×5): qty 1

## 2017-01-24 MED ORDER — SENNOSIDES-DOCUSATE SODIUM 8.6-50 MG PO TABS
1.0000 | ORAL_TABLET | Freq: Every evening | ORAL | Status: DC | PRN
  Administered 2017-01-27: 1 via ORAL
  Filled 2017-01-24: qty 1

## 2017-01-24 MED ORDER — ONDANSETRON HCL 4 MG PO TABS: 4.0000 mg | ORAL_TABLET | Freq: Four times a day (QID) | ORAL | Status: DC | PRN

## 2017-01-24 MED ORDER — LATANOPROST 0.005 % OP SOLN
1.0000 [drp] | Freq: Every day | OPHTHALMIC | Status: DC
  Administered 2017-01-25 – 2017-01-28 (×4): 1 [drp] via OPHTHALMIC
  Filled 2017-01-24: qty 2.5

## 2017-01-24 MED ORDER — HEPARIN SODIUM (PORCINE) 5000 UNIT/ML IJ SOLN
5000.0000 [IU] | Freq: Three times a day (TID) | INTRAMUSCULAR | Status: DC
  Administered 2017-01-25 – 2017-01-29 (×13): 5000 [IU] via SUBCUTANEOUS
  Filled 2017-01-24 (×13): qty 1

## 2017-01-24 MED ORDER — MIRABEGRON ER 25 MG PO TB24
25.0000 mg | ORAL_TABLET | Freq: Every day | ORAL | Status: DC
  Administered 2017-01-25 – 2017-01-29 (×5): 25 mg via ORAL
  Filled 2017-01-24 (×5): qty 1

## 2017-01-24 MED ORDER — IPRATROPIUM BROMIDE 0.02 % IN SOLN: 0.5000 mg | Freq: Four times a day (QID) | RESPIRATORY_TRACT | Status: DC | PRN

## 2017-01-24 MED ORDER — FERROUS SULFATE 325 (65 FE) MG PO TABS
325.0000 mg | ORAL_TABLET | Freq: Every day | ORAL | Status: DC
  Administered 2017-01-25 – 2017-01-29 (×5): 325 mg via ORAL
  Filled 2017-01-24 (×5): qty 1

## 2017-01-24 MED ORDER — LIDOCAINE 5 % EX OINT
1.0000 "application " | TOPICAL_OINTMENT | Freq: Every day | CUTANEOUS | Status: DC | PRN
  Filled 2017-01-24: qty 35.44

## 2017-01-24 MED ORDER — VANCOMYCIN HCL IN DEXTROSE 1-5 GM/200ML-% IV SOLN
1000.0000 mg | Freq: Once | INTRAVENOUS | Status: AC
  Administered 2017-01-24: 1000 mg via INTRAVENOUS
  Filled 2017-01-24: qty 200

## 2017-01-24 MED ORDER — PANTOPRAZOLE SODIUM 40 MG PO TBEC
40.0000 mg | DELAYED_RELEASE_TABLET | Freq: Every day | ORAL | Status: DC
  Administered 2017-01-25 – 2017-01-29 (×5): 40 mg via ORAL
  Filled 2017-01-24 (×5): qty 1

## 2017-01-24 MED ORDER — VANCOMYCIN HCL IN DEXTROSE 750-5 MG/150ML-% IV SOLN
750.0000 mg | INTRAVENOUS | Status: DC
  Filled 2017-01-24: qty 150

## 2017-01-24 MED ORDER — PIPERACILLIN-TAZOBACTAM 3.375 G IVPB 30 MIN
3.3750 g | Freq: Once | INTRAVENOUS | Status: AC
  Administered 2017-01-24: 3.375 g via INTRAVENOUS
  Filled 2017-01-24: qty 50

## 2017-01-24 MED ORDER — SODIUM CHLORIDE 0.9 % IV BOLUS (SEPSIS)
500.0000 mL | Freq: Once | INTRAVENOUS | Status: AC
  Administered 2017-01-24: 500 mL via INTRAVENOUS

## 2017-01-24 MED ORDER — ONDANSETRON HCL 4 MG/2ML IJ SOLN: 4.0000 mg | Freq: Four times a day (QID) | INTRAMUSCULAR | Status: DC | PRN

## 2017-01-24 MED ORDER — ALBUTEROL SULFATE (2.5 MG/3ML) 0.083% IN NEBU: 2.5000 mg | INHALATION_SOLUTION | Freq: Four times a day (QID) | RESPIRATORY_TRACT | Status: DC | PRN

## 2017-01-24 MED ORDER — OXYBUTYNIN CHLORIDE 5 MG PO TABS
5.0000 mg | ORAL_TABLET | Freq: Three times a day (TID) | ORAL | Status: DC
  Administered 2017-01-25 – 2017-01-29 (×13): 5 mg via ORAL
  Filled 2017-01-24 (×13): qty 1

## 2017-01-24 NOTE — Progress Notes (Signed)
Pharmacy Antibiotic Note  Barbara [name] is a 76 y.o. female admitted on 01/24/2017 with sepsis.  Pharmacy has been consulted for Zosyn and vancomycin dosing.  Plan: 1. Zosyn 3.375 gm IV Q12H EI 2. Vancomycin 1 gm IV x 1 in ED followed by vancomycin 750 mg IV Q-dialysis TTS. Pharmacy will continue to follow and adjust as needed to maintain trough 15 to 20 mcg/mL.    Height: 5' 8"  (172.7 cm) Weight: 154 lb (69.9 kg) IBW/kg (Calculated) : 63.9  Temp (24hrs), Avg:100.5 F (38.1 C), Min:100.5 F (38.1 C), Max:100.5 F (38.1 C)   Recent Labs Lab 01/24/17 1738 01/24/17 1739  WBC 11.8*  --   CREATININE 9.62*  --   LATICACIDVEN  --  1.3    Estimated Creatinine Clearance: 4.9 mL/min (A) (by C-G formula based on SCr of 9.62 mg/dL (H)).    Allergies  Allergen Reactions  . Glipizide Er     Dropped blood sugar too much  . Percocet [Oxycodone-Acetaminophen] Hives   Thank you for allowing pharmacy to be a part of this patient's care.  Laural Benes, Pharm.D., BCPS Clinical Pharmacist 01/24/2017 8:39 PM

## 2017-01-24 NOTE — ED Notes (Signed)
Iv attempted twice by primary nurse and once by Charlean Sanfilippo without success

## 2017-01-24 NOTE — H&P (Signed)
History and Physical   [facility] - [place] @ [facility] Admission History and Physical [employer], D.O.    Patient Name: Barbara [name] MR#: [id number] Date of Birth: 03/29/1941 Date of Admission: 01/24/2017  Referring MD/NP/PA: Dr. Kerman Passey Primary Care Physician: Madelyn Brunner, MD Patient coming from: Home   Chief Complaint:  Chief Complaint  Patient presents with  . Abdominal Pain    HPI: Patric Vanpelt is a 76 y.o. female with a known history of Anemia of chronic disease, bladder cancer, end-stage renal disease on hemodialysis Tuesday Thursday and Saturday, COPD, diabetes, hyperlipidemia, GERD, hypertension presents to the emergency department for evaluation of generalized weakness.  Patient was in a usual state of health until the past 2-3 days when she describes feeling generally unwell, lethargic and globally weak. At baseline she is functionally independent but for the past 2 days she has required assistance with activities of daily living. Of note patient missed dialysis due to her symptoms yesterday. She also complains of diarrhea every time she goes to the bathroom although she denies any recent antibiotic use..  Patient denies fevers/chills, weakness, dizziness, chest pain, shortness of breath, nausea/vomiting/constipation, abdominal pain, dysuria/frequency, changes in mental status.    Otherwise there has been no change in status. Patient has been taking medication as prescribed and there has been no recent change in medication or diet.  No recent antibiotics.  There has been no recent illness, hospitalizations, travel or sick contacts.    EMS/ED Course: Patient received Zosyn, Vanco, normal saline. Medical admission was requested for further management of sepsis of unknown source..  Review of Systems:  CONSTITUTIONAL: Positive generalized weakness, fatigue No fever/chills,  weight gain/loss, headache. EYES: No blurry or double vision. ENT: No tinnitus,  postnasal drip, redness or soreness of the oropharynx. RESPIRATORY: No cough, dyspnea, wheeze.  No hemoptysis.  CARDIOVASCULAR: No chest pain, palpitations, syncope, orthopnea. No lower extremity edema.  GASTROINTESTINAL: Positive diarrhea. No nausea, vomiting, abdominal pain, constipation.  No hematemesis, melena or hematochezia. GENITOURINARY: No dysuria, frequency, hematuria. ENDOCRINE: No polyuria or nocturia. No heat or cold intolerance. HEMATOLOGY: No anemia, bruising, bleeding. INTEGUMENTARY: No rashes, ulcers, lesions. MUSCULOSKELETAL: No arthritis, gout, dyspnea. NEUROLOGIC: No numbness, tingling, ataxia, seizure-type activity, weakness. PSYCHIATRIC: No anxiety, depression, insomnia.   Past Medical History:  Diagnosis Date  . Anemia associated with chronic renal failure 2017   blood transfusion last week 10/17  . Arthritis   . Cancer ([place]) 2017   bladder  . Chronic kidney disease   . 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  dr. Candiss Norse and dr. Holley Raring follow her  . COPD (chronic obstructive pulmonary disease) ([place])   . Diabetes mellitus without complication ([place])   . Dyspnea    with exertion  . Elevated lipids   . GERD (gastroesophageal reflux disease)   . Hematuria   . Hypertension   . Lower back pain   . Oxygen dependent    at hs  . Personal history of chemotherapy   . Sleep apnea    uses cpap  . Urinary obstruction 01/2016    Past Surgical History:  Procedure Laterality Date  . ABDOMINAL HYSTERECTOMY    . CYSTOSCOPY W/ RETROGRADES Bilateral 02/17/2016   Procedure: CYSTOSCOPY WITH RETROGRADE PYELOGRAM;  Surgeon: Hollice Espy, MD;  Location: [facility];  Service: Urology;  Laterality: Bilateral;  . CYSTOSCOPY W/ RETROGRADES Bilateral 10/12/2016   Procedure: CYSTOSCOPY WITH RETROGRADE PYELOGRAM;  Surgeon: Hollice Espy, MD;  Location: [facility];  Service: Urology;  Laterality: Bilateral;  . CYSTOSCOPY W/ URETERAL STENT PLACEMENT Left 05/12/2016    Procedure: CYSTOSCOPY WITH STENT REPLACEMENT;  Surgeon: Hollice Espy, MD;  Location: [facility];  Service: Urology;  Laterality: Left;  . CYSTOSCOPY W/ URETERAL STENT PLACEMENT Left 10/12/2016   Procedure: CYSTOSCOPY WITH STENT REPLACEMENT;  Surgeon: Hollice Espy, MD;  Location: [facility];  Service: Urology;  Laterality: Left;  . CYSTOSCOPY WITH STENT PLACEMENT Left 01/21/2016   Procedure: CYSTOSCOPY WITH double J STENT PLACEMENT;  Surgeon: Franchot Gallo, MD;  Location: [facility];  Service: Urology;  Laterality: Left;  . CYSTOSCOPY WITH STENT PLACEMENT Right 10/12/2016   Procedure: CYSTOSCOPY WITH STENT PLACEMENT;  Surgeon: Hollice Espy, MD;  Location: [facility];  Service: Urology;  Laterality: Right;  . DIALYSIS/PERMA CATHETER INSERTION N/A 11/20/2016   Procedure: Dialysis/Perma Catheter Insertion;  Surgeon: Algernon Huxley, MD;  Location: [place] [facility];  Service: Cardiovascular;  Laterality: N/A;  . KIDNEY SURGERY  01/21/2016   IR NEPHROSTOMY PLACEMENT LEFT   . PERIPHERAL VASCULAR CATHETERIZATION N/A 04/07/2016   Procedure: Glori Luis Cath Insertion;  Surgeon: Katha Cabal, MD;  Location: [place] [facility];  Service: Cardiovascular;  Laterality: N/A;  . ROTATOR CUFF REPAIR     both sides  . TRANSURETHRAL RESECTION OF BLADDER TUMOR N/A 02/17/2016   Procedure: TRANSURETHRAL RESECTION OF BLADDER TUMOR (TURBT)-LARGE;  Surgeon: Hollice Espy, MD;  Location: [facility];  Service: Urology;  Laterality: N/A;  . URETEROSCOPY Left 02/17/2016   Procedure: URETEROSCOPY;  Surgeon: Hollice Espy, MD;  Location: [facility];  Service: Urology;  Laterality: Left;  . URETEROSCOPY Right 10/12/2016   Procedure: URETEROSCOPY;  Surgeon: Hollice Espy, MD;  Location: [facility];  Service: Urology;  Laterality: Right;     reports that she quit smoking about 14 years ago. Her smoking use included Cigarettes. She has never used smokeless tobacco. She reports that she does not drink alcohol or use drugs.  Allergies   Allergen Reactions  . Glipizide Er     Dropped blood sugar too much  . Percocet [Oxycodone-Acetaminophen] Hives    Family History  Problem Relation Age of Onset  . Diabetes Mother   . Cancer Father   . Breast cancer Neg Hx   . Kidney disease Neg Hx     Prior to Admission medications   Medication Sig Start Date End Date Taking? Authorizing Provider  acetaminophen (TYLENOL) 500 MG tablet Take 500 mg by mouth every 6 (six) hours as needed for moderate pain or headache.   Yes [provider]  Albuterol Sulfate (PROAIR RESPICLICK) 151 (90 Base) MCG/ACT AEPB Inhale 2 puffs into the lungs 4 (four) times daily as needed (wheezing).    Yes [provider]  cholecalciferol (VITAMIN D) 1000 units tablet Take 1,000 Units by mouth daily.   Yes [provider]  esomeprazole (NEXIUM) 40 MG capsule Take 40 mg by mouth daily at 12 noon.   Yes [provider]  ferrous sulfate 325 (65 FE) MG EC tablet Take 325 mg by mouth daily.   Yes [provider]  gabapentin (NEURONTIN) 300 MG capsule Take 600 mg by mouth 3 (three) times daily.   Yes [provider]  lidocaine (XYLOCAINE) 5 % ointment Apply 1 application topically daily as needed for mild pain.  05/21/16  Yes [provider]  MYRBETRIQ 25 MG TB24 tablet TAKE 1 TABLET (25 MG TOTAL) BY MOUTH DAILY. 06/10/16  Yes Noreene Filbert, MD  NON FORMULARY Place 1 Units into  the nose at bedtime. CPAP Time of use 2100-0600   Yes [provider]  oxybutynin (DITROPAN) 5 MG tablet Take 5 mg by mouth 3 (three) times daily.   Yes [provider]  OXYGEN Inhale 2 L into the lungs at bedtime.   Yes [provider]  TRAVATAN Z 0.004 % SOLN ophthalmic solution PLACE 1 DROP INTO BOTH EYES EVERY NIGHT AT BEDTIME 07/12/16  Yes [provider]  calcium acetate (PHOSLO) 667 MG capsule Take 2 capsules (1,334 mg total) by mouth 3 (three) times daily with meals. Patient not taking:  Reported on 01/24/2017 11/23/16   Fritzi Mandes, MD    Physical Exam: Vitals:   01/24/17 1735 01/24/17 1736  BP: (!) 143/130   Pulse: 98   Resp: (!) 28   Temp: (!) 100.5 F (38.1 C)   TempSrc: Oral   SpO2: 94%   Weight:  69.9 kg (154 lb)  Height:  5' 8"  (1.727 m)    GENERAL: 76 y.o.-year-old Elderly female patient, chronically ill-appearing lying in the bed in no acute distress.  Pleasant and cooperative.   HEENT: Head atraumatic, normocephalic. Pupils equal, round, reactive to light and accommodation. No scleral icterus. Extraocular muscles intact. Mucus membranes moist. NECK: Supple, full range of motion.  CHEST: Normal breath sounds bilaterally. No wheezing, rales, rhonchi or crackles. No use of accessory muscles of respiration.  No reproducible chest wall tenderness. There is a port in the chest wall which is nontender and not CARDIOVASCULAR: S1, S2 normal. No murmurs, rubs, or gallops. Cap refill <2 seconds. Pulses intact distally.  ABDOMEN: Soft, nondistended, nontender. No rebound, guarding, rigidity. Normoactive bowel sounds present in all four quadrants. No organomegaly or mass. EXTREMITIES: No pedal edema, cyanosis, or clubbing. No calf tenderness or Homan's sign.  NEUROLOGIC: The patient is alert and oriented x 3. Cranial nerves II through XII are grossly intact with no focal sensorimotor deficit. Muscle strength is equal in all extremities PSYCHIATRIC:  Normal affect, mood, thought content. SKIN: Warm, dry, and intact without obvious rash, lesion, or ulcer.    Labs on Admission:  CBC:  Recent Labs Lab 01/24/17 1738  WBC 11.8*  HGB 11.0*  HCT 34.4*  MCV 98.3  PLT 295   Basic Metabolic Panel:  Recent Labs Lab 01/24/17 1738  NA 132*  K 4.8  CL 102  CO2 13*  GLUCOSE 181*  BUN 89*  CREATININE 9.62*  CALCIUM 8.8*   GFR: Estimated Creatinine Clearance: 4.9 mL/min (A) (by C-G formula based on SCr of 9.62 mg/dL (H)). Liver Function Tests:  Recent Labs Lab  01/24/17 1738  AST 24  ALT 17  ALKPHOS 76  BILITOT 0.6  PROT 8.4*  ALBUMIN 3.4*    Recent Labs Lab 01/24/17 1738  LIPASE 37   No results for input(s): AMMONIA in the last 168 hours. Coagulation Profile: No results for input(s): INR, PROTIME in the last 168 hours. Cardiac Enzymes: No results for input(s): CKTOTAL, CKMB, CKMBINDEX, TROPONINI in the last 168 hours. BNP (last 3 results) No results for input(s): PROBNP in the last 8760 hours. HbA1C: No results for input(s): HGBA1C in the last 72 hours. CBG: No results for input(s): GLUCAP in the last 168 hours. Lipid Profile: No results for input(s): CHOL, HDL, LDLCALC, TRIG, CHOLHDL, LDLDIRECT in the last 72 hours. Thyroid Function Tests: No results for input(s): TSH, T4TOTAL, FREET4, T3FREE, THYROIDAB in the last 72 hours. Anemia Panel: No results for input(s): VITAMINB12, FOLATE, FERRITIN, TIBC, IRON, RETICCTPCT in the  last 72 hours. Urine analysis:    Component Value Date/Time   COLORURINE YELLOW (A) 01/24/2017 1738   APPEARANCEUR CLOUDY (A) 01/24/2017 1738   APPEARANCEUR Cloudy (A) 01/13/2017 0909   LABSPEC 1.010 01/24/2017 1738   LABSPEC 1.012 08/02/2012 1114   PHURINE 6.0 01/24/2017 1738   GLUCOSEU NEGATIVE 01/24/2017 1738   GLUCOSEU Negative 08/02/2012 1114   HGBUR MODERATE (A) 01/24/2017 1738   BILIRUBINUR NEGATIVE 01/24/2017 1738   BILIRUBINUR Negative 01/13/2017 0909   BILIRUBINUR Negative 08/02/2012 1114   KETONESUR NEGATIVE 01/24/2017 1738   PROTEINUR 100 (A) 01/24/2017 1738   NITRITE NEGATIVE 01/24/2017 1738   LEUKOCYTESUR LARGE (A) 01/24/2017 1738   LEUKOCYTESUR 3+ (A) 01/13/2017 0909   LEUKOCYTESUR Negative 08/02/2012 1114   Sepsis Labs: @LABRCNTIP (procalcitonin:4,lacticidven:4) )No results found for this or any previous visit (from the past 240 hour(s)).   Radiological Exams on Admission: Dg Chest 2 View  Result Date: 01/24/2017 CLINICAL DATA:  Fever. Possible infection. COPD and sleep apnea.  Bladder cancer. EXAM: CHEST  2 VIEW COMPARISON:  11/19/2016. FINDINGS: Hyperinflation. Right-sided Port-A-Cath unchanged in position with tip at low SVC. Left-sided dialysis catheter new since prior radiograph with tip at low SVC. Midline trachea. Mild cardiomegaly with a tortuous thoracic aorta. Possible trace bilateral pleural fluid or thickening. Bibasilar volume loss with probable atelectasis, similar. IMPRESSION: Given differences in technique, no significant change since 11/19/2016. Bibasilar volume loss with probable subsegmental atelectasis. Possible trace bilateral pleural fluid or thickening. Cardiomegaly without congestive failure. Electronically Signed   By: Abigail Miyamoto M.D.   On: 01/24/2017 18:21    Assessment/Plan  This is a 76 y.o. female with a history of Anemia of chronic disease, bladder cancer, end-stage renal disease on hemodialysis Tuesday Thursday and Saturday, COPD, diabetes, hyperlipidemia, GERD, hypertension now being admitted with:  #. Sepsis secondary to UTI, other possible sources include port and GI tract - Admit to inpatient with telemetry monitoring - IV antibiotics: Zosyn and Vanco - IV fluid hydration - Follow up blood,urine & sputum cultures. Check stool studies - Repeat CBC in am.  - May need to culture tip of port  #. History of end-stage renal disease on hemodialysis -Nephrology consulted for inpatient dialysis  #. History of anemia -Continue iron  #. History of urinary incontinence -Continue Mirabegron  #. History of GERD - Continue Nexium  Admission status: Inpatient IV Fluids: Normal saline Diet/Nutrition: Renal Consults called: Nephrology  DVT Px: Heparin, SCDs and early ambulation. Code Status: Full Code  Disposition Plan: To home in 1-2 days  All the records are reviewed and case discussed with ED provider. Management plans discussed with the patient and/or family who express understanding and agree with plan of care.  Brayon Bielefeld  D.O. on 01/24/2017 at 10:30 PM Between 7am to 6pm - Pager - [phone] After 6pm go to [email or web address] - [employer] [facility] [place] [facility] Office [phone] CC: Primary care physician; Madelyn Brunner, MD   01/24/2017, 10:30 PM

## 2017-01-24 NOTE — ED Provider Notes (Signed)
[facility] Emergency Department Provider Note  Time seen: 8:31 PM  I have reviewed the triage vital signs and the nursing notes.   HISTORY  Chief Complaint Abdominal Pain    HPI Barbara [name] is a 76 y.o. female with a past medical history of anemia, end-stage renal disease on hemodialysis Tuesday, Thursday, Saturday, COPD, diabetes, hypertension, presents to the emergency department for generalized fatigue/weakness and fever. According to the patient for the past 2 days she has been feeling extremely weak and needing assistance to stand up which she states is not normal. States today she is feeling generally weak and her legs were giving out on her so she came to the emergency department. Patient was found have a fever 100.5 in the emergency department. Patient states she did not go to dialysis yesterday due to extreme fatigue. Denies any cough, congestion, sore throat, chest pain, abdominal pain nausea or vomiting. She does state intermittent episodes of diarrhea. She does make urine most days denies any dysuria.   Past Medical History:  Diagnosis Date  . Anemia associated with chronic renal failure 2017   blood transfusion last week 10/17  . Arthritis   . Cancer ([place]) 2017   bladder  . Chronic kidney disease   . 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  dr. Candiss Norse and dr. Holley Raring follow her  . COPD (chronic obstructive pulmonary disease) ([place])   . Diabetes mellitus without complication ([place])   . Dyspnea    with exertion  . Elevated lipids   . GERD (gastroesophageal reflux disease)   . Hematuria   . Hypertension   . Lower back pain   . Oxygen dependent    at hs  . Personal history of chemotherapy   . Sleep apnea    uses cpap  . Urinary obstruction 01/2016    Patient Active Problem List   Diagnosis Date Noted  . Acute kidney injury ([place]) 11/19/2016  . Anemia, chronic renal failure, stage 4 (severe) ([place]) 09/17/2016  . Hypoglycemia  06/04/2016  . Hyperkalemia 05/12/2016  . Protein-calorie malnutrition, severe 05/05/2016  . Acute on chronic renal failure ([place]) 05/04/2016  . Seizure ([place]) 04/17/2016  . Palliative care by specialist   . DNR (do not resuscitate) discussion   . C. difficile diarrhea 03/11/2016  . Anemia 03/11/2016  . Hypotension 03/11/2016  . Acidosis 03/11/2016  . Failure to thrive (child) 03/11/2016  . Weakness generalized 03/11/2016  . ARF (acute renal failure) ([place]) 03/04/2016  . Cancer of trigone of urinary bladder ([place]) 03/02/2016  . Absolute anemia 02/04/2016  . Airway hyperreactivity 02/04/2016  . Celiac disease 02/04/2016  . Gastric catarrh 02/04/2016  . Acid reflux 02/04/2016  . Combined fat and carbohydrate induced hyperlipemia 02/04/2016  . C. difficile colitis 01/22/2016  . Urinary obstruction 01/21/2016  . COPD (chronic obstructive pulmonary disease) ([place]) 01/21/2016  . Controlled type 2 diabetes mellitus with stage 4 chronic kidney disease, with long-term current use of insulin ([place]) 01/21/2016  . Essential hypertension 01/21/2016  . Acute renal failure superimposed on stage 4 chronic kidney disease ([place]) 01/20/2016  . Anemia in chronic kidney disease 01/20/2016  . Arthritis of knee, degenerative 05/10/2015  . Knee strain 03/26/2015  . Other intervertebral disc displacement, lumbar region 03/04/2015  . Degenerative arthritis of lumbar spine 03/04/2015  . Injury of tendon of upper extremity 11/12/2014  . Atherosclerosis of abdominal aorta ([place]) 11/02/2014  . Chronic kidney disease, stage IV (severe) ([place]) 11/02/2014  .  Obstructive apnea 11/02/2014  . Complete rotator cuff rupture of left shoulder 10/05/2014  . Arthritis of shoulder region, degenerative 10/05/2014  . CAD in native artery 12/08/2013  . Benign essential HTN 12/08/2013  . Type 2 diabetes mellitus ([place]) 12/08/2013    Past Surgical History:  Procedure Laterality Date  . ABDOMINAL HYSTERECTOMY    . CYSTOSCOPY W/  RETROGRADES Bilateral 02/17/2016   Procedure: CYSTOSCOPY WITH RETROGRADE PYELOGRAM;  Surgeon: Hollice Espy, MD;  Location: [facility] OR[facility];  Service: Urology;  Laterality: Bilateral;  . CYSTOSCOPY W/ RETROGRADES Bilateral 10/12/2016   Procedure: CYSTOSCOPY WITH RETROGRADE PYELOGRAM;  Surgeon: Hollice Espy, MD;  Location: [facility];  Service: Urology;  Laterality: Bilateral;  . CYSTOSCOPY W/ URETERAL STENT PLACEMENT Left 05/12/2016   Procedure: CYSTOSCOPY WITH STENT REPLACEMENT;  Surgeon: Hollice Espy, MD;  Location: [facility];  Service: Urology;  Laterality: Left;  . CYSTOSCOPY W/ URETERAL STENT PLACEMENT Left 10/12/2016   Procedure: CYSTOSCOPY WITH STENT REPLACEMENT;  Surgeon: Hollice Espy, MD;  Location: [facility];  Service: Urology;  Laterality: Left;  . CYSTOSCOPY WITH STENT PLACEMENT Left 01/21/2016   Procedure: CYSTOSCOPY WITH double J STENT PLACEMENT;  Surgeon: Franchot Gallo, MD;  Location: [facility];  Service: Urology;  Laterality: Left;  . CYSTOSCOPY WITH STENT PLACEMENT Right 10/12/2016   Procedure: CYSTOSCOPY WITH STENT PLACEMENT;  Surgeon: Hollice Espy, MD;  Location: [facility];  Service: Urology;  Laterality: Right;  . DIALYSIS/PERMA CATHETER INSERTION N/A 11/20/2016   Procedure: Dialysis/Perma Catheter Insertion;  Surgeon: Algernon Huxley, MD;  Location: [place] [facility];  Service: Cardiovascular;  Laterality: N/A;  . KIDNEY SURGERY  01/21/2016   IR NEPHROSTOMY PLACEMENT LEFT   . PERIPHERAL VASCULAR CATHETERIZATION N/A 04/07/2016   Procedure: Glori Luis Cath Insertion;  Surgeon: Katha Cabal, MD;  Location: [place] [facility];  Service: Cardiovascular;  Laterality: N/A;  . ROTATOR CUFF REPAIR     both sides  . TRANSURETHRAL RESECTION OF BLADDER TUMOR N/A 02/17/2016   Procedure: TRANSURETHRAL RESECTION OF BLADDER TUMOR (TURBT)-LARGE;  Surgeon: Hollice Espy, MD;  Location: [facility];  Service: Urology;  Laterality: N/A;  . URETEROSCOPY Left 02/17/2016   Procedure: URETEROSCOPY;   Surgeon: Hollice Espy, MD;  Location: [facility];  Service: Urology;  Laterality: Left;  . URETEROSCOPY Right 10/12/2016   Procedure: URETEROSCOPY;  Surgeon: Hollice Espy, MD;  Location: [facility];  Service: Urology;  Laterality: Right;    Prior to Admission medications   Medication Sig Start Date End Date Taking? Authorizing Provider  acetaminophen (TYLENOL) 500 MG tablet Take 500 mg by mouth every 6 (six) hours as needed for moderate pain or headache.    [provider]  Albuterol Sulfate (PROAIR RESPICLICK) 810 (90 Base) MCG/ACT AEPB Inhale 2 puffs into the lungs 4 (four) times daily as needed (wheezing).     [provider]  atorvastatin (LIPITOR) 40 MG tablet Take 40 mg by mouth every evening.     [provider]  calcium acetate (PHOSLO) 667 MG capsule Take 2 capsules (1,334 mg total) by mouth 3 (three) times daily with meals. 11/23/16   Fritzi Mandes, MD  cholecalciferol (VITAMIN D) 1000 units tablet Take 1,000 Units by mouth daily.    [provider]  esomeprazole (NEXIUM) 40 MG capsule Take 40 mg by mouth daily at 12 noon.    [provider]  ferrous sulfate 325 (65 FE) MG EC tablet Take 325 mg by mouth daily.    [provider]  gabapentin (NEURONTIN) 300 MG capsule Take  600 mg by mouth 3 (three) times daily.    [provider]  lidocaine (XYLOCAINE) 5 % ointment Apply 1 application topically daily as needed for mild pain.  05/21/16   [provider]  MYRBETRIQ 25 MG TB24 tablet TAKE 1 TABLET (25 MG TOTAL) BY MOUTH DAILY. 06/10/16   Noreene Filbert, MD  NON FORMULARY Place 1 Units into the nose at bedtime. CPAP Time of use 2100-0600    [provider]  oxybutynin (DITROPAN) 5 MG tablet Take 5 mg by mouth 3 (three) times daily.    [provider]  OXYGEN Inhale 2 L into the lungs at bedtime.    [provider]  TRAVATAN Z 0.004 % SOLN ophthalmic solution PLACE 1 DROP INTO BOTH EYES EVERY NIGHT  AT BEDTIME 07/12/16   [provider]    Allergies  Allergen Reactions  . Glipizide Er     Dropped blood sugar too much  . Percocet [Oxycodone-Acetaminophen] Hives    Family History  Problem Relation Age of Onset  . Diabetes Mother   . Cancer Father   . Breast cancer Neg Hx   . Kidney disease Neg Hx     Social History Social History  Substance Use Topics  . Smoking status: Former Smoker    Types: Cigarettes    Quit date: 07/20/2002  . Smokeless tobacco: Never Used     Comment: 01/22/2016   "  quit smoking many years ago "  . Alcohol use No    Review of Systems Constitutional: Negative for fever. Cardiovascular: Negative for chest pain. Respiratory: Negative for shortness of breath. Gastrointestinal: Negative for abdominal pain, vomiting. Positive for diarrhea. Genitourinary: Negative for dysuria. Musculoskeletal: Negative for back pain. Neurological: Negative for headache All other ROS negative  ____________________________________________   PHYSICAL EXAM:  VITAL SIGNS: ED Triage Vitals  Enc Vitals Group     BP 01/24/17 1735 (!) 143/130     Pulse Rate 01/24/17 1735 98     Resp 01/24/17 1735 (!) 28     Temp 01/24/17 1735 (!) 100.5 F (38.1 C)     Temp Source 01/24/17 1735 Oral     SpO2 01/24/17 1735 94 %     Weight 01/24/17 1736 154 lb (69.9 kg)     Height 01/24/17 1736 5' 8"  (1.727 m)     Head Circumference --      Peak Flow --      Pain Score 01/24/17 1733 3     Pain Loc --      Pain Edu? --      Excl. in [place]? --     Constitutional: Alert and oriented. Well appearing and in no distress. Eyes: Normal exam ENT   Head: Normocephalic and atraumatic.   Mouth/Throat: Mucous membranes are moist. Cardiovascular: Normal rate, regular rhythm.  Respiratory: Normal respiratory effort without tachypnea nor retractions. Breath sounds are clear. Patient has a port to the right chest, with a dialysis catheter to the left  subclavian. Gastrointestinal: Soft and nontender. No distention.  Musculoskeletal: Nontender with normal range of motion in all extremities. Neurologic:  Normal speech and language. No gross focal neurologic deficits  Skin:  Skin is warm, dry and intact.  Psychiatric: Mood and affect are normal.   ____________________________________________     RADIOLOGY  Chest x-ray: No significant change.  ____________________________________________   INITIAL IMPRESSION / ASSESSMENT AND PLAN / ED COURSE  Pertinent labs & imaging results that were available during my care of the patient  were reviewed by me and considered in my medical decision making (see chart for details).  Patient presents to the emergency department with generalized fatigue and weakness found to have a fever to 100.5 in the emergency department. Patient has a left subclavian dialysis catheter as well as a right subclavian port. Given the patient's central lines with fever of unknown origin we will start on broad spectrum antibiotics, check labs. Patient technically meet sepsis criteria. Overall she appears well, she does appear weak but has a nontender abdomen, denies any cough. She states she makes urine most days we will attempt to obtain a urine sample to evaluate.  Patient's labs show slight white blood cell count elevation otherwise largely negative however given the patient's central lines with fever of unknown origin we will admit to the hospital continue to cover broad-spectrum antibiotics and to the clinical picture becomes more clear.  ____________________________________________   FINAL CLINICAL IMPRESSION(S) / ED DIAGNOSES  Sepsis Fever Generalized weakness    Harvest Dark, MD 01/24/17 2143

## 2017-01-24 NOTE — ED Notes (Signed)
Patient transported to 202

## 2017-01-24 NOTE — ED Triage Notes (Signed)
Patient presents to the ED with "not feeling well" since Saturday morning.  Patient usually has dialysis on Saturdays but missed yesterday due to not feeling well.  Patient reports abdominal pain and lower back pain.  Patient denies vomiting and diarrhea.

## 2017-01-25 LAB — BASIC METABOLIC PANEL
ANION GAP: 14 (ref 5–15)
BUN: 91 mg/dL — ABNORMAL HIGH (ref 6–20)
CALCIUM: 8.7 mg/dL — AB (ref 8.9–10.3)
CO2: 14 mmol/L — AB (ref 22–32)
CREATININE: 9.66 mg/dL — AB (ref 0.44–1.00)
Chloride: 108 mmol/L (ref 101–111)
GFR, EST AFRICAN AMERICAN: 4 mL/min — AB (ref >60)
GFR, EST NON AFRICAN AMERICAN: 3 mL/min — AB (ref >60)
GLUCOSE: 108 mg/dL — AB (ref 65–99)
Potassium: 5.2 mmol/L — ABNORMAL HIGH (ref 3.5–5.1)
Sodium: 136 mmol/L (ref 135–145)

## 2017-01-25 LAB — CBC
HCT: 32.3 % — ABNORMAL LOW (ref 35.0–47.0)
HEMOGLOBIN: 10.6 g/dL — AB (ref 12.0–16.0)
MCH: 32.3 pg (ref 26.0–34.0)
MCHC: 32.7 g/dL (ref 32.0–36.0)
MCV: 98.9 fL (ref 80.0–100.0)
PLATELETS: 213 10*3/uL (ref 150–440)
RBC: 3.27 MIL/uL — ABNORMAL LOW (ref 3.80–5.20)
RDW: 16.5 % — AB (ref 11.5–14.5)
WBC: 10.3 10*3/uL (ref 3.6–11.0)

## 2017-01-25 LAB — POTASSIUM: Potassium: 5.4 mmol/L — ABNORMAL HIGH (ref 3.5–5.1)

## 2017-01-25 NOTE — Evaluation (Signed)
Physical Therapy Evaluation Patient Details Name: Barbara [name] MRN: [id number] DOB: 29-Jul-1940 Today's Date: 01/25/2017   History of Present Illness  Barbara [name] is a 76 y.o. female with a known history of anemia of chronic disease, bladder cancer, end-stage renal disease on hemodialysis Tuesday Thursday and Saturday, COPD, diabetes, hyperlipidemia, GERD, hypertension presents to the emergency department for evaluation of generalized weakness.  Patient was in a usual state of health until the past 2-3 days when she describes feeling generally unwell, lethargic and globally weak. At baseline, she is functionally independent but for the past 2 days she has required assistance with activities of daily living and has been using her rollator for ambulation. She is now admitted for sepsis secondary to UTI and is currently on enteric precautions  Clinical Impression  Pt admitted with above diagnosis. Pt currently with functional limitations due to the deficits listed below (see PT Problem List).  Pt moves very slowly with bed mobility and transfers. She is able to ambulate to door and back to recliner with therapist during evaluation. She moves very slowly with ambulation and relies considerably on UE support from rolling walker for added support. At one point pt pauses and her knees start to buckle. She requires increased UE support and minA+1 to prevent a fall and be able to continue ambulating back to recliner. VSS throughout ambulation and pt denies DOE. She would benefit from SNF placement at this time due to considerable weakness and high fall risk. Pt unsure if she would agree to placement. If she declines she will need 24/7 support at home and [facility] PT. Pt will benefit from PT services to address deficits in strength, balance, and mobility in order to return to full function at home.     Follow Up Recommendations SNF;Other (comment) (Pt unsure if she will agree to SNF placement)    Equipment  Recommendations  None recommended by PT;Other (comment) (should use her front wheeled walker at discharge)    Recommendations for Other Services       Precautions / Restrictions Precautions Precautions: Fall Precaution Comments: Enteric Restrictions Weight Bearing Restrictions: No      Mobility  Bed Mobility Overal bed mobility: Modified Independent             General bed mobility comments: HOB elevated, bed rails utilized. Pt moves slowly, requiring increased time to come to EOB  Transfers Overall transfer level: Needs assistance Equipment used: Rolling walker (2 wheeled) Transfers: Sit to/from Stand Sit to Stand: Min guard         General transfer comment: Pt requires increased time to come to standing demonstrating decreased LE power. She is steady with use of rolling walker and does not require external assist  Ambulation/Gait Ambulation/Gait assistance: Min assist Ambulation Distance (Feet): 40 Feet Assistive device: Rolling walker (2 wheeled) Gait Pattern/deviations: Decreased step length - right;Decreased step length - left Gait velocity: Decreased Gait velocity interpretation: <1.8 ft/sec, indicative of risk for recurrent falls General Gait Details: Pt is able to ambulate to door and back to recliner. She moves very slowly with ambulation and relies considerably on UE support from rolling walker. At one point pt pauses and her knees start to buckle. She requires increased UE support and minA+1 to prevent a fall and be able to continue ambulating back to recliner. VSS throughout ambulation and pt denies DOE  Financial trader [place] (Stroke Patients Only)  Balance Overall balance assessment: Needs assistance Sitting-balance support: No upper extremity supported Sitting balance-Leahy Scale: Fair     Standing balance support: No upper extremity supported Standing balance-Leahy Scale: Fair Standing balance  comment: Pt able to maintain wide and narrow balance without UE support. Negative [place] for LOB but she does have slight increase in sway. Unable to clear feet from floor to attempt single leg balance                             Pertinent Vitals/Pain Pain Assessment: No/denies pain    Home Living Family/patient expects to be discharged to:: Private residence Living Arrangements: Spouse/significant other Available Help at Discharge: Family;Available 24 hours/day Type of Home: House Home Access: Stairs to enter Entrance Stairs-Rails: None Entrance Stairs-Number of Steps: 1 Home Layout: One level Home Equipment: Walker - 2 wheels;Cane - single point;Wheelchair - [employer] - 4 wheels;Bedside commode;Other (comment) (no grabs bars, wears O2 at night)      Prior Function Level of Independence: Independent with assistive device(s)         Comments: Pt reports that normally she ambulates without an assistive device. She mostly stays at home but will go out for groceries or to [facility] once/month. Husband drives. Reports that she is independent with ADLs/IADLs. No falls in the last 12 months     Hand Dominance   Dominant Hand: Right    Extremity/Trunk Assessment   Upper Extremity Assessment Upper Extremity Assessment: Generalized weakness;RUE deficits/detail RUE Deficits / Details: Severly limited bilateral shoulder flexion AROM which is chronic prior to admission. Otherwised general UE weakness present    Lower Extremity Assessment Lower Extremity Assessment: Generalized weakness;RLE deficits/detail RLE Deficits / Details: No focal weakness identified but pt is significantly weak in bilateral LEs       Communication   Communication: No difficulties  Cognition Arousal/Alertness: Awake/alert Behavior During Therapy: WFL for tasks assessed/performed Overall Cognitive Status: Within Functional Limits for tasks assessed                                         General Comments      Exercises     Assessment/Plan    PT Assessment Patient needs continued PT services  PT Problem List Decreased strength;Decreased balance;Decreased activity tolerance       PT Treatment Interventions Gait training;DME instruction;Functional mobility training;Therapeutic activities;Therapeutic exercise;Balance training;Neuromuscular re-education;Patient/family education;Wheelchair mobility training    PT Goals (Current goals can be found in the Care Plan section)  Acute Rehab PT Goals Patient Stated Goal: Return to prior level of function at home PT Goal Formulation: With patient Time For Goal Achievement: 02/08/17 Potential to Achieve Goals: Fair    Frequency Min 2X/week   Barriers to discharge   Pt has good support from husband at home    Co-evaluation               AM-PAC PT "6 Clicks" Daily Activity  Outcome Measure Difficulty turning over in bed (including adjusting bedclothes, sheets and blankets)?: None Difficulty moving from lying on back to sitting on the side of the bed? : A Little Difficulty sitting down on and standing up from a chair with arms (e.g., wheelchair, bedside commode, etc,.)?: A Little Help needed moving to and from a bed to chair (including a wheelchair)?: A Little Help needed walking in hospital room?: A  Little Help needed climbing 3-5 steps with a railing? : A Lot 6 Click Score: 18    End of Session Equipment Utilized During Treatment: Gait belt Activity Tolerance: Patient tolerated treatment well Patient left: in chair;with call bell/phone within reach;with chair alarm set Nurse Communication: Other (comment) (CSW notified of DC recommendations) PT Visit Diagnosis: Unsteadiness on feet (R26.81);Muscle weakness (generalized) (M62.81)    Time: 7215-8727 PT Time Calculation (min) (ACUTE ONLY): 28 min   Charges:   PT Evaluation $PT Eval Moderate Complexity: 1 Procedure PT Treatments $Therapeutic  Activity: 8-22 mins   PT G Codes:   PT G-Codes **NOT FOR INPATIENT CLASS** Functional Assessment Tool Used: AM-PAC 6 Clicks Basic Mobility Functional Limitation: Mobility: Walking and moving around Mobility: Walking and Moving Around Current Status ([id number]): At least 40 percent but less than 60 percent impaired, limited or restricted Mobility: Walking and Moving Around Goal Status [phone]): At least 20 percent but less than 40 percent impaired, limited or restricted    Phillips Grout PT, DPT    Huprich,Jason 01/25/2017, 11:16 AM

## 2017-01-25 NOTE — Evaluation (Signed)
Occupational Therapy Evaluation Patient Details Name: Barbara [name] MRN: [id number] DOB: [birth year] Today's Date: 01/25/2017    History of Present Illness Barbara [name] is a 76 y.o. female with a known history of anemia of chronic disease, bladder cancer, end-stage renal disease on hemodialysis Tuesday Thursday and Saturday, COPD, diabetes, hyperlipidemia, GERD, hypertension presents to the emergency department for evaluation of generalized weakness.  Patient was in a usual state of health until the past 2-3 days when she describes feeling generally unwell, lethargic and globally weak. At baseline, she is functionally independent but for the past 2 days she has required assistance with activities of daily living and has been using her rollator for ambulation. She is now admitted for sepsis secondary to UTI and is currently on enteric precautions   Clinical Impression   Pt seen for OT evaluation this date. Pt was independent at baseline, living with spouse in a 1 story home with 1 STE. Pt presents with significant weakness and poor activity tolerance and would benefit from skilled OT services to address noted impairments and functional deficits in ADL in order to maximize return to PLOF, including education/training in AE/DME for ADL, education/training in energy conservation strategies, home/routine modifications, and falls prevention strategies. Pt would benefit from STR following hospitalization to maximize safety and return to PLOF, and minimize risk of falls.     Follow Up Recommendations  SNF    Equipment Recommendations  Toilet rise with handles;Tub/shower seat    Recommendations for Other Services       Precautions / Restrictions Precautions Precautions: Fall;Other (comment) Precaution Comments: Enteric Restrictions Weight Bearing Restrictions: No      Mobility Bed Mobility             General bed mobility comments: deferred, pt up in chair for session, please see PT  evaluation note  Transfers    Balance Overall balance assessment: Needs assistance Sitting-balance support: No upper extremity supported Sitting balance-Leahy Scale: Fair                                ADL either performed or assessed with clinical judgement   ADL Overall ADL's : Needs assistance/impaired Eating/Feeding: Sitting;Set up   Grooming: Sitting;Set up   Upper Body Bathing: Sitting;Set up;Supervision/ safety   Lower Body Bathing: Sitting/lateral leans;Minimal assistance;Set up   Upper Body Dressing : Sitting;Set up;Supervision/safety   Lower Body Dressing: Sitting/lateral leans;Minimal assistance;Set up Lower Body Dressing Details (indicate cue type and reason): pt educated in adaptive strategies to support LB dressing while maintaining upright positioning and minimizing need to bend over through verbal instruction and visual demonstration, pt verbalized understanding   Toilet Transfer Details (indicate cue type and reason): deferred due to fatigue this session           General ADL Comments: pt generally min assist level for LB ADL, fatigues quickly     Vision Baseline Vision/History: No visual deficits Patient Visual Report: No change from baseline Vision Assessment?: No apparent visual deficits     Perception     Praxis      Pertinent Vitals/Pain Pain Assessment: No/denies pain (when attempting to raise R shoulder, pt reports 10/10 pain)     Hand Dominance Right   Extremity/Trunk Assessment Upper Extremity Assessment Upper Extremity Assessment: Generalized weakness;RUE deficits/detail RUE Deficits / Details: Severly limited bilateral shoulder flexion AROM which is chronic prior to admission with significant pain; full PROM. Otherwised general UE weakness  present; good- grip strength, full AROM with LUE   Lower Extremity Assessment Lower Extremity Assessment: Defer to PT evaluation;Generalized weakness RLE Deficits / Details: No focal  weakness identified but pt is significantly weak in bilateral LEs       Communication Communication Communication: No difficulties   Cognition Arousal/Alertness: Awake/alert Behavior During Therapy: WFL for tasks assessed/performed Overall Cognitive Status: Within Functional Limits for tasks assessed                                     General Comments       Exercises Other Exercises Other Exercises: pt educated in energy conservation strategies and fatigue mgt including home/routines modifications to maximize safety and functional independence with ADL and IADL; pt verbalized understanding Other Exercises: OT facilitated problem solving to support bladder mgt/urinary frequency/urgency and educated pt on toileting schedule, continued use of BSC beside bed overnight and over regular toilet during the day to support increased safety/independence with toilet transfers, pt verbalized understanding   Shoulder Instructions      Home Living Family/patient expects to be discharged to:: Private residence Living Arrangements: Spouse/significant other Available Help at Discharge: Family;Available 24 hours/day (spouse and daughter) Type of Home: House Home Access: Stairs to enter [employer] of Steps: 1 Entrance Stairs-Rails: None Home Layout: One level     Bathroom Shower/Tub: Teacher, early years/pre: Standard     Home Equipment: Environmental consultant - 2 wheels;Cane - single point;Wheelchair - [employer] - 4 wheels;Bedside commode;Other (comment) (no grab bars, wears O2 at night)          Prior Functioning/Environment Level of Independence: Independent with assistive device(s)        Comments: Pt reports that normally she ambulates without an assistive device. She mostly stays at home but will go out for groceries or to [facility] once/month. Husband drives. Reports that she is independent with ADLs/IADLs. No falls in the last 12 months. Enjoys cooking which  she does often. Pt wears "pull ups" to support bladder mgt and uses BSC beside bed overnight        OT Problem List: Decreased strength;Decreased activity tolerance;Decreased knowledge of use of DME or AE;Impaired balance (sitting and/or standing)      OT Treatment/Interventions: Self-care/ADL training;Therapeutic exercise;Therapeutic activities;Energy conservation;DME and/or AE instruction;Patient/family education    OT Goals(Current goals can be found in the care plan section) Acute Rehab OT Goals Patient Stated Goal: feel better and go home OT Goal Formulation: With patient Time For Goal Achievement: 02/08/17 Potential to Achieve Goals: Good  OT Frequency: Min 1X/week   Barriers to D/C:            Co-evaluation              AM-PAC PT "6 Clicks" Daily Activity     Outcome Measure Help from another person eating meals?: None Help from another person taking care of personal grooming?: None Help from another person toileting, which includes using toliet, bedpan, or urinal?: A Little Help from another person bathing (including washing, rinsing, drying)?: A Little Help from another person to put on and taking off regular upper body clothing?: A Little Help from another person to put on and taking off regular lower body clothing?: A Little 6 Click Score: 20   End of Session    Activity Tolerance: Patient tolerated treatment well (fatigued after PT evaluation) Patient left: in chair;with call bell/phone within reach;with  chair alarm set  OT Visit Diagnosis: Other abnormalities of gait and mobility (R26.89);Muscle weakness (generalized) (M62.81)                Time: 9373-7496 OT Time Calculation (min): 34 min Charges:  OT General Charges $OT Visit: 1 Procedure OT Evaluation $OT Eval Low Complexity: 1 Procedure OT Treatments $Therapeutic Activity: 8-22 mins G-Codes:     Jeni Salles, MPH, MS, OTR/L ascom [phone] 01/25/17, 11:54 AM

## 2017-01-25 NOTE — Progress Notes (Signed)
[place], [place] 01/25/17  Subjective:  Patient known to our practice from outpatient dialysis.  Presents for profound weakness, diarrhea over the weekend.  Missed her treatment on Saturday which is unusual for her. Patient is found to have hyperkalemia as she missed her dialysis on Saturday.  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 She was seen by urologist on June 27.  At that time it was noted that she continues to have urinary frequency, urgency and intermittent gross hematuria.she underwent a cystoscopy, diffusely erythematous bladder, b/l ureteral stents removed    HEMODIALYSIS FLOWSHEET:  Blood Flow Rate (mL/min): 400 mL/min Arterial Pressure (mmHg): -180 mmHg Venous Pressure (mmHg): 140 mmHg Transmembrane Pressure (mmHg): 40 mmHg Ultrafiltration Rate (mL/min): 280 mL/min Dialysate Flow Rate (mL/min): 800 ml/min Conductivity: Machine : 14 Conductivity: Machine : 14 Dialysis Fluid Bolus: Normal Saline Bolus Amount (mL): 250 mL    Objective:  Vital signs in last 24 hours:  Temp:  [98.2 F (36.8 C)-100.5 F (38.1 C)] 98.2 F (36.8 C) (07/09 1442) Pulse Rate:  [74-98] 76 (07/09 1530) Resp:  [18-28] 25 (07/09 1530) BP: (92-143)/(52-130) 99/61 (07/09 1530) SpO2:  [94 %-98 %] 98 % (07/09 1447) Weight:  [69.9 kg (154 lb)-73.5 kg (162 lb 0.6 oz)] 73.5 kg (162 lb 0.6 oz) (07/09 1442)  Weight change:  Filed Weights   01/24/17 1736 01/25/17 1442  Weight: 69.9 kg (154 lb) 73.5 kg (162 lb 0.6 oz)    Intake/Output:    Intake/Output Summary (Last 24 hours) at 01/25/17 1728 Last data filed at 01/25/17 1300  Gross per 24 hour  Intake           938.67 ml  Output                0 ml  Net           938.67 ml     Physical Exam: General: Chronically  ill-appearing, laying in the bed  HEENT Anicteric, moist oral mucous membranes  Neck supple  Pulm/lungs Normal breathing effort, clear to auscultation  CVS/Heart irregular  Abdomen:  Soft, nontender, nondistended  Extremities: No edema  Neurologic: Alert, able to answer questions  Skin: Normal turgor  Access: Left IJ PermCath       Basic Metabolic Panel:   Recent Labs Lab 01/24/17 1738 01/25/17 0332 01/25/17 1026  NA 132* 136  --   K 4.8 5.2* 5.4*  CL 102 108  --   CO2 13* 14*  --   GLUCOSE 181* 108*  --   BUN 89* 91*  --   CREATININE 9.62* 9.66*  --   CALCIUM 8.8* 8.7*  --      CBC:  Recent Labs Lab 01/24/17 1738 01/25/17 0332  WBC 11.8* 10.3  HGB 11.0* 10.6*  HCT 34.4* 32.3*  MCV 98.3 98.9  PLT 231 213     Lab Results  Component Value Date   HEPBSAG Negative 11/20/2016      Microbiology:  Recent Results (from the past 240 hour(s))  Culture, blood (Routine x 2)     Status: None (Preliminary result)   Collection Time: 01/24/17  5:39 PM  Result Value Ref Range Status   Specimen Description BLOOD LEFT HAND  Final   Special Requests   Final    BOTTLES DRAWN AEROBIC AND ANAEROBIC Blood Culture  adequate volume   Culture NO GROWTH < 24 HOURS  Final   Report Status PENDING  Incomplete  Culture, blood (Routine x 2)     Status: None (Preliminary result)   Collection Time: 01/24/17  9:29 PM  Result Value Ref Range Status   Specimen Description BLOOD RIGHT FOREARM  Final   Special Requests   Final    BOTTLES DRAWN AEROBIC AND ANAEROBIC Blood Culture adequate volume   Culture NO GROWTH < 24 HOURS  Final   Report Status PENDING  Incomplete    Coagulation Studies: No results for input(s): LABPROT, INR in the last 72 hours.  Urinalysis:  Recent Labs  01/24/17 1738  COLORURINE YELLOW*  LABSPEC 1.010  PHURINE 6.0  GLUCOSEU NEGATIVE  HGBUR MODERATE*  BILIRUBINUR NEGATIVE  KETONESUR NEGATIVE  PROTEINUR 100*  NITRITE NEGATIVE  LEUKOCYTESUR LARGE*       Imaging: Dg Chest 2 View  Result Date: 01/24/2017 CLINICAL DATA:  Fever. Possible infection. COPD and sleep apnea. Bladder cancer. EXAM: CHEST  2 VIEW COMPARISON:  11/19/2016. FINDINGS: Hyperinflation. Right-sided Port-A-Cath unchanged in position with tip at low SVC. Left-sided dialysis catheter new since prior radiograph with tip at low SVC. Midline trachea. Mild cardiomegaly with a tortuous thoracic aorta. Possible trace bilateral pleural fluid or thickening. Bibasilar volume loss with probable atelectasis, similar. IMPRESSION: Given differences in technique, no significant change since 11/19/2016. Bibasilar volume loss with probable subsegmental atelectasis. Possible trace bilateral pleural fluid or thickening. Cardiomegaly without congestive failure. Electronically Signed   By: Abigail Miyamoto M.D.   On: 01/24/2017 18:21     Medications:   . sodium chloride 50 mL/hr at 01/25/17 0850  . piperacillin-tazobactam (ZOSYN)  IV Stopped (01/25/17 1439)  . [START ON 01/26/2017] vancomycin     . cholecalciferol  1,000 Units Oral Daily  . ferrous sulfate  325 mg Oral Daily  . gabapentin  600 mg Oral TID  . heparin  5,000 Units Subcutaneous Q8H  . latanoprost  1 drop Both Eyes QHS  . mirabegron ER  25 mg Oral Daily  . oxybutynin  5 mg Oral TID  . pantoprazole  40 mg Oral Daily   albuterol, bisacodyl, ipratropium, lidocaine, ondansetron **OR** ondansetron (ZOFRAN) IV, senna-docusate  Assessment/ Plan:  76 y.o. 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  End-stage renal disease, [place]; TTS/  [facility] 2.  Hyperkalemia 3.  diarrhea 4.  H/o Bladder cancer with recent stent removal and cystoscopy showing grossly  erythematous bladder from radiation changes 5.  Anemia of chronic kidney disease 6.  Secondary hyperparathyroidism  Plan: Urgent treatment today for hemodialysis Routine hemodialysis tomorrow Agree with gentle IV hydration until diary of symptoms are resolved EPO with HD   Recent bladder instrumentation; probably ok continue Abx ; await  Urine Cx    LOS: 1 [facility] 7/9/20185:28 PM  [facility] [place], [place]

## 2017-01-25 NOTE — Progress Notes (Signed)
[place] [facility] [place]: Barbara [name]    MR#:  [id number]  DATE OF BIRTH:  25-Sep-1940  SUBJECTIVE:  CHIEF COMPLAINT:   Chief Complaint  Patient presents with  . Abdominal Pain  Is having trouble putting weight on her knees, feeling very weak, could not walk.  She had low-grade fever REVIEW OF SYSTEMS:    Review of Systems  Constitutional: Positive for fever and malaise/fatigue. Negative for chills.  HENT: Negative for sore throat.   Eyes: Negative for blurred vision, double vision and pain.  Respiratory: Negative for cough, hemoptysis, shortness of breath and wheezing.   Cardiovascular: Negative for chest pain, palpitations, orthopnea and leg swelling.  Gastrointestinal: Positive for nausea. Negative for abdominal pain, constipation, diarrhea, heartburn and vomiting.  Genitourinary: Negative for dysuria and hematuria.  Musculoskeletal: Negative for back pain and joint pain.  Skin: Negative for rash.  Neurological: Positive for weakness. Negative for sensory change, speech change, focal weakness and headaches.  Endo/Heme/Allergies: Does not bruise/bleed easily.  Psychiatric/Behavioral: Negative for depression. The patient is not nervous/anxious.    DRUG ALLERGIES:   Allergies  Allergen Reactions  . Glipizide Er     Dropped blood sugar too much  . Percocet [Oxycodone-Acetaminophen] Hives   VITALS:  Blood pressure (!) 113/52, pulse 79, temperature 98.5 F (36.9 C), temperature source Oral, resp. rate 20, height 5' 8"  (1.727 m), weight 69.9 kg (154 lb), SpO2 94 %. PHYSICAL EXAMINATION:   Physical Exam  GENERAL:  77 y.o.-year-old patient lying in the bed with no acute distress.  EYES: Pupils equal, round, reactive to light and accommodation. No scleral icterus. Extraocular muscles intact.  HEENT: Head atraumatic, normocephalic. Oropharynx and nasopharynx clear.  NECK:  Supple, no jugular venous distention. No thyroid enlargement, no  tenderness.  LUNGS: Normal breath sounds bilaterally, no wheezing, rales, rhonchi. No use of accessory muscles of respiration. HD Access + CARDIOVASCULAR: S1, S2 normal. No murmurs, rubs, or gallops.  ABDOMEN: Soft, nontender, nondistended. Bowel sounds present. No organomegaly or mass.  EXTREMITIES: No cyanosis, clubbing or edema b/l.    NEUROLOGIC: Cranial nerves II through XII are intact. No focal Motor or sensory deficits b/l.   PSYCHIATRIC: The patient is alert and oriented x 3.  SKIN: No obvious rash, lesion, or ulcer.  LABORATORY PANEL:   CBC  Recent Labs Lab 01/25/17 0332  WBC 10.3  HGB 10.6*  HCT 32.3*  PLT 213   ------------------------------------------------------------------------------------------------------------------ Chemistries   Recent Labs Lab 01/24/17 1738 01/25/17 0332  NA 132* 136  K 4.8 5.2*  CL 102 108  CO2 13* 14*  GLUCOSE 181* 108*  BUN 89* 91*  CREATININE 9.62* 9.66*  CALCIUM 8.8* 8.7*  AST 24  --   ALT 17  --   ALKPHOS 76  --   BILITOT 0.6  --    ------------------------------------------------------------------------------------------------------------------  Cardiac Enzymes No results for input(s): TROPONINI in the last 168 hours. ------------------------------------------------------------------------------------------------------------------  RADIOLOGY:  Dg Chest 2 View  Result Date: 01/24/2017 CLINICAL DATA:  Fever. Possible infection. COPD and sleep apnea. Bladder cancer. EXAM: CHEST  2 VIEW COMPARISON:  11/19/2016. FINDINGS: Hyperinflation. Right-sided Port-A-Cath unchanged in position with tip at low SVC. Left-sided dialysis catheter new since prior radiograph with tip at low SVC. Midline trachea. Mild cardiomegaly with a tortuous thoracic aorta. Possible trace bilateral pleural fluid or thickening. Bibasilar volume loss with probable atelectasis, similar. IMPRESSION: Given differences in technique, no significant change since  11/19/2016. Bibasilar volume loss with probable  subsegmental atelectasis. Possible trace bilateral pleural fluid or thickening. Cardiomegaly without congestive failure. Electronically Signed   By: Abigail Miyamoto M.D.   On: 01/24/2017 18:21   ASSESSMENT AND PLAN:  Barbara [name] is a 76 y.o. female with a history of Anemia of chronic disease, bladder cancer, end-stage renal disease on hemodialysis Tuesday Thursday and Saturday, COPD, diabetes, hyperlipidemia, GERD, hypertension now being admitted with:  #. Sepsis: Present on admission secondary to UTI, other possible sources include port and GI tract - continue IV antibiotics: Zosyn and Vanco - continue IV fluid hydration - Follow up blood,urine & sputum cultures. Check stool studies - Repeat CBC in am.  - May need to culture tip of port - ID c/s  #. History of end-stage renal disease on hemodialysis: somewhat hypokalemic with potassium of 5.2 - We will recheck potassium and treat accordingly -Nephrology consulted for inpatient dialysis  #. History of anemia of chronic kidney disease -Continue iron  #. History of urinary incontinence - bladder cancer and ureteral stents.  - Follows with Dr. Erlene Quan -Continue Mirabegron AND oxybutynin  #. History of GERD - Continue Nexium  # Neuropathy on gabapentin  # History of sleep apnea on oxygen and CPAP at night  # History of type 2 diabetes mellitus without complication. - Monitor blood sugar   Physical therapy consultation  All the records are reviewed and case discussed with Care Management/Social Worker Management plans discussed with the patient, nursing and they are in agreement.  CODE STATUS: FULL CODE  DVT Prophylaxis: SCDs  TOTAL TIME TAKING CARE OF THIS PATIENT: 30 minutes.   POSSIBLE D/C IN 1-2 DAYS, DEPENDING ON CLINICAL CONDITION. And ID eval  Max Sane M.D on 01/25/2017 at 9:58 AM  Between 7am to 6pm - Pager - [phone]  After 6pm go to [email or web address] - password  [facility]  Office  [phone]  CC: Primary care physician; Madelyn Brunner, MD  Note: This dictation was prepared with Dragon dictation along with smaller phrase technology. Any transcriptional errors that result from this process are unintentional.

## 2017-01-25 NOTE — NC FL2 (Signed)
[place] LEVEL OF CARE SCREENING TOOL     IDENTIFICATION  Patient Name: Barbara [name] Birthdate: August 06, 1940 Sex: female Admission Date (Current Location): 01/24/2017  [place] and [place] Number:  Engineering geologist and Address:  [facility], [address] [place], [place] [address]      Provider Number: [id number]  Attending Physician Name and Address:  Max Sane, MD  Relative Name and Phone Number:       Current Level of Care: Hospital Recommended Level of Care: [place] Prior Approval Number:    Date Approved/Denied:   PASRR Number: [id number]  Discharge Plan: SNF    Current Diagnoses: Patient Active Problem List   Diagnosis Date Noted  . Sepsis secondary to UTI ([place]) 01/24/2017  . Acute kidney injury ([place]) 11/19/2016  . Anemia, chronic renal failure, stage 4 (severe) ([place]) 09/17/2016  . Hypoglycemia 06/04/2016  . Hyperkalemia 05/12/2016  . Protein-calorie malnutrition, severe 05/05/2016  . Acute on chronic renal failure ([place]) 05/04/2016  . Seizure ([place]) 04/17/2016  . Palliative care by specialist   . DNR (do not resuscitate) discussion   . C. difficile diarrhea 03/11/2016  . Anemia 03/11/2016  . Hypotension 03/11/2016  . Acidosis 03/11/2016  . Failure to thrive (child) 03/11/2016  . Weakness generalized 03/11/2016  . ARF (acute renal failure) ([place]) 03/04/2016  . Cancer of trigone of urinary bladder ([place]) 03/02/2016  . Absolute anemia 02/04/2016  . Airway hyperreactivity 02/04/2016  . Celiac disease 02/04/2016  . Gastric catarrh 02/04/2016  . Acid reflux 02/04/2016  . Combined fat and carbohydrate induced hyperlipemia 02/04/2016  . C. difficile colitis 01/22/2016  . Urinary obstruction 01/21/2016  . COPD (chronic obstructive pulmonary disease) ([place]) 01/21/2016  . Controlled type 2 diabetes mellitus with stage 4 chronic kidney disease, with long-term current use of insulin ([place]) 01/21/2016  .  Essential hypertension 01/21/2016  . Acute renal failure superimposed on stage 4 chronic kidney disease ([place]) 01/20/2016  . Anemia in chronic kidney disease 01/20/2016  . Arthritis of knee, degenerative 05/10/2015  . Knee strain 03/26/2015  . Other intervertebral disc displacement, lumbar region 03/04/2015  . Degenerative arthritis of lumbar spine 03/04/2015  . Injury of tendon of upper extremity 11/12/2014  . Atherosclerosis of abdominal aorta ([place]) 11/02/2014  . Chronic kidney disease, stage IV (severe) ([place]) 11/02/2014  . Obstructive apnea 11/02/2014  . Complete rotator cuff rupture of left shoulder 10/05/2014  . Arthritis of shoulder region, degenerative 10/05/2014  . CAD in native artery 12/08/2013  . Benign essential HTN 12/08/2013  . Type 2 diabetes mellitus ([place]) 12/08/2013    Orientation RESPIRATION BLADDER Height & Weight     Self, Time, Situation, Place  Normal, O2 Continent Weight: 154 lb (69.9 kg) Height:  5' 8"  (172.7 cm)  BEHAVIORAL SYMPTOMS/MOOD NEUROLOGICAL BOWEL NUTRITION STATUS   (none)  (none) Continent Diet  AMBULATORY STATUS COMMUNICATION OF NEEDS Skin   Limited Assist Verbally Normal                       Personal Care Assistance Level of Assistance  Bathing, Dressing Bathing Assistance: Limited assistance   Dressing Assistance: Limited assistance     Functional Limitations Info  Hearing   Hearing Info: Impaired      SPECIAL CARE FACTORS FREQUENCY  PT (By licensed PT)                    Contractures Contractures Info: Not present  Additional Factors Info  Code Status, Allergies Code Status Info: full Allergies Info: glipizide; percocet           Current Medications (01/25/2017):  This is the current hospital active medication list Current Facility-Administered Medications  Medication Dose Route Frequency Provider Last Rate Last Dose  . 0.9 %  sodium chloride infusion   Intravenous Continuous Hugelmeyer, Alexis, DO 50 mL/hr  at 01/25/17 0850    . albuterol (PROVENTIL) (2.5 MG/3ML) 0.083% nebulizer solution 2.5 mg  2.5 mg Nebulization Q6H PRN Hugelmeyer, Alexis, DO      . bisacodyl (DULCOLAX) EC tablet 5 mg  5 mg Oral Daily PRN Hugelmeyer, Alexis, DO      . cholecalciferol (VITAMIN D) tablet 1,000 Units  1,000 Units Oral Daily Hugelmeyer, Alexis, DO   1,000 Units at 01/25/17 1039  . ferrous sulfate tablet 325 mg  325 mg Oral Daily Hugelmeyer, Alexis, DO   325 mg at 01/25/17 1039  . gabapentin (NEURONTIN) capsule 600 mg  600 mg Oral TID Hugelmeyer, Alexis, DO   Stopped at 01/25/17 1600  . heparin injection 5,000 Units  5,000 Units Subcutaneous Q8H Hugelmeyer, Alexis, DO   5,000 Units at 01/25/17 1403  . ipratropium (ATROVENT) nebulizer solution 0.5 mg  0.5 mg Nebulization Q6H PRN Hugelmeyer, Alexis, DO      . latanoprost (XALATAN) 0.005 % ophthalmic solution 1 drop  1 drop Both Eyes QHS Hugelmeyer, Alexis, DO      . lidocaine (XYLOCAINE) 5 % ointment 1 application  1 application Topical Daily PRN Hugelmeyer, Alexis, DO      . mirabegron ER (MYRBETRIQ) tablet 25 mg  25 mg Oral Daily Hugelmeyer, Alexis, DO   25 mg at 01/25/17 1039  . ondansetron (ZOFRAN) tablet 4 mg  4 mg Oral Q6H PRN Hugelmeyer, Alexis, DO       Or  . ondansetron (ZOFRAN) injection 4 mg  4 mg Intravenous Q6H PRN Hugelmeyer, Alexis, DO      . oxybutynin (DITROPAN) tablet 5 mg  5 mg Oral TID Hugelmeyer, Alexis, DO   Stopped at 01/25/17 1600  . pantoprazole (PROTONIX) EC tablet 40 mg  40 mg Oral Daily Hugelmeyer, Alexis, DO   40 mg at 01/25/17 1039  . piperacillin-tazobactam (ZOSYN) IVPB 3.375 g  3.375 g Intravenous Q12H Harvest Dark, MD   Stopped at 01/25/17 1439  . senna-docusate (Senokot-S) tablet 1 tablet  1 tablet Oral QHS PRN Hugelmeyer, Alexis, DO      . [START ON 01/26/2017] vancomycin (VANCOCIN) IVPB 750 mg/150 ml premix  750 mg Intravenous Q T,Th,Sa-HD Harvest Dark, MD         Discharge Medications: Please see discharge summary for  a list of discharge medications.  Relevant Imaging Results:  Relevant Lab Results:   Additional Information ss: [id number]  Shela Leff, LCSW

## 2017-01-25 NOTE — Progress Notes (Signed)
PR HD ASSESSMENT

## 2017-01-25 NOTE — Consult Note (Signed)
[facility] Infectious Disease     Reason for Consult: ABX management     Referring Physician: Carlynn Spry Date of Admission:  01/24/2017   Active Problems:   Sepsis secondary to UTI [facility])   HPI: FIDELIS LOTH is a 76 y.o. female admitted with abdominal pain, weakness and missing HD session. She also reports increasing urination but no dysuria. On admit her wbc was 11 anda temp 1005.    UA showed TNTC WBC and RBC.   She has a hx of bladder cancer and follows with oncology and urology. She had cystoscopy 6/27 with Dr Erlene Quan. SHe had stents in place but had persistent hydronephrosis on imaging. Stents were removed. UA had > 30 wbc but no ucx available.   She also has a hx of Anemia of chronic disease, bladder cancer, end-stage renal disease on hemodialysis Tuesday Thursday and Saturday, COPD, diabetes, hyperlipidemia, GERD, hypertension  Past Medical History:  Diagnosis Date  . Anemia associated with chronic renal failure 2017   blood transfusion last week 10/17  . Arthritis   . Cancer ([place]) 2017   bladder  . Chronic kidney disease   . 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  dr. Candiss Norse and dr. Holley Raring follow her  . COPD (chronic obstructive pulmonary disease) ([place])   . Diabetes mellitus without complication ([place])   . Dyspnea    with exertion  . Elevated lipids   . GERD (gastroesophageal reflux disease)   . Hematuria   . Hypertension   . Lower back pain   . Oxygen dependent    at hs  . Personal history of chemotherapy   . Sleep apnea    uses cpap  . Urinary obstruction 01/2016   Past Surgical History:  Procedure Laterality Date  . ABDOMINAL HYSTERECTOMY    . CYSTOSCOPY W/ RETROGRADES Bilateral 02/17/2016   Procedure: CYSTOSCOPY WITH RETROGRADE PYELOGRAM;  Surgeon: Hollice Espy, MD;  Location: [facility] ORS;  Service: Urology;  Laterality: Bilateral;  . CYSTOSCOPY W/ RETROGRADES Bilateral 10/12/2016   Procedure: CYSTOSCOPY WITH RETROGRADE PYELOGRAM;  Surgeon: Hollice Espy, MD;  Location: [facility] ORS;  Service: Urology;  Laterality: Bilateral;  . CYSTOSCOPY W/ URETERAL STENT PLACEMENT Left 05/12/2016   Procedure: CYSTOSCOPY WITH STENT REPLACEMENT;  Surgeon: Hollice Espy, MD;  Location: [facility] OR[facility];  Service: Urology;  Laterality: Left;  . CYSTOSCOPY W/ URETERAL STENT PLACEMENT Left 10/12/2016   Procedure: CYSTOSCOPY WITH STENT REPLACEMENT;  Surgeon: Hollice Espy, MD;  Location: [facility] OR[facility];  Service: Urology;  Laterality: Left;  . CYSTOSCOPY WITH STENT PLACEMENT Left 01/21/2016   Procedure: CYSTOSCOPY WITH double J STENT PLACEMENT;  Surgeon: Franchot Gallo, MD;  Location: [facility];  Service: Urology;  Laterality: Left;  . CYSTOSCOPY WITH STENT PLACEMENT Right 10/12/2016   Procedure: CYSTOSCOPY WITH STENT PLACEMENT;  Surgeon: Hollice Espy, MD;  Location: [facility];  Service: Urology;  Laterality: Right;  . DIALYSIS/PERMA CATHETER INSERTION N/A 11/20/2016   Procedure: Dialysis/Perma Catheter Insertion;  Surgeon: Algernon Huxley, MD;  Location: [facility];  Service: Cardiovascular;  Laterality: N/A;  . KIDNEY SURGERY  01/21/2016   IR NEPHROSTOMY PLACEMENT LEFT   . PERIPHERAL VASCULAR CATHETERIZATION N/A 04/07/2016   Procedure: Glori Luis Cath Insertion;  Surgeon: Katha Cabal, MD;  Location: [facility];  Service: Cardiovascular;  Laterality: N/A;  . ROTATOR CUFF REPAIR     both sides  . TRANSURETHRAL RESECTION OF BLADDER TUMOR N/A 02/17/2016   Procedure: TRANSURETHRAL RESECTION OF BLADDER TUMOR (  TURBT)-LARGE;  Surgeon: Hollice Espy, MD;  Location: [facility];  Service: Urology;  Laterality: N/A;  . URETEROSCOPY Left 02/17/2016   Procedure: URETEROSCOPY;  Surgeon: Hollice Espy, MD;  Location: [facility];  Service: Urology;  Laterality: Left;  . URETEROSCOPY Right 10/12/2016   Procedure: URETEROSCOPY;  Surgeon: Hollice Espy, MD;  Location: [facility];  Service: Urology;  Laterality: Right;   Social History  Substance Use Topics  . Smoking status:  Former Smoker    Types: Cigarettes    Quit date: 07/20/2002  . Smokeless tobacco: Never Used     Comment: 01/22/2016   "  quit smoking many years ago "  . Alcohol use No   Family History  Problem Relation Age of Onset  . Diabetes Mother   . Cancer Father   . Breast cancer Neg Hx   . Kidney disease Neg Hx     Allergies:  Allergies  Allergen Reactions  . Glipizide Er     Dropped blood sugar too much  . Percocet [Oxycodone-Acetaminophen] Hives    Current antibiotics: Antibiotics Given (last 72 hours)    Date/Time Action Medication Dose Rate   01/24/17 2157 New Bag/Given   piperacillin-tazobactam (ZOSYN) IVPB 3.375 g 3.375 g 100 mL/hr   01/24/17 2243 New Bag/Given   vancomycin (VANCOCIN) IVPB 1000 mg/200 mL premix 1,000 mg 200 mL/hr   01/25/17 1039 New Bag/Given   piperacillin-tazobactam (ZOSYN) IVPB 3.375 g 3.375 g 12.5 mL/hr      MEDICATIONS: . cholecalciferol  1,000 Units Oral Daily  . ferrous sulfate  325 mg Oral Daily  . gabapentin  600 mg Oral TID  . heparin  5,000 Units Subcutaneous Q8H  . latanoprost  1 drop Both Eyes QHS  . mirabegron ER  25 mg Oral Daily  . oxybutynin  5 mg Oral TID  . pantoprazole  40 mg Oral Daily    Review of Systems - 11 systems reviewed and negative per HPI   OBJECTIVE: Temp:  [98.2 F (36.8 C)-100.5 F (38.1 C)] 98.2 F (36.8 C) (07/09 1442) Pulse Rate:  [74-98] 76 (07/09 1530) Resp:  [18-28] 25 (07/09 1530) BP: (92-143)/(52-130) 99/61 (07/09 1530) SpO2:  [94 %-98 %] 98 % (07/09 1447) Weight:  [69.9 kg (154 lb)-73.5 kg (162 lb 0.6 oz)] 73.5 kg (162 lb 0.6 oz) (07/09 1442) Physical Exam  Constitutional:  oriented to person, place, and time. appears frail HENT: [place]/AT, PERRLA, no scleral icterus Mouth/Throat: Oropharynx is clear and moist. No oropharyngeal exudate.  Cardiovascular: Normal rate, regular rhythm and normal heart sounds. Exam reveals no gallop and no friction rub.  No murmur heard.  Pulmonary/Chest: Effort normal  and breath sounds normal. No respiratory distress.  has no wheezes.  Neck = supple, no nuchal rigidity HD cath L chest wall wnl, R sided portacath in place Abdominal: Soft. Bowel sounds are normal.  exhibits no distension. There is no tenderness.  Lymphadenopathy: no cervical adenopathy. No axillary adenopathy Neurological: alert and oriented to person, place, and time.  Skin: Skin is warm and dry. No rash noted. No erythema.  Psychiatric: a normal mood and affect.  behavior is normal.    LABS: Results for orders placed or performed during the hospital encounter of 01/24/17 (from the past 48 hour(s))  Lipase, blood     Status: None   Collection Time: 01/24/17  5:38 PM  Result Value Ref Range   Lipase 37 11 - 51 U/L  Comprehensive metabolic panel     Status: Abnormal  Collection Time: 01/24/17  5:38 PM  Result Value Ref Range   Sodium 132 (L) 135 - 145 mmol/L   Potassium 4.8 3.5 - 5.1 mmol/L   Chloride 102 101 - 111 mmol/L   CO2 13 (L) 22 - 32 mmol/L   Glucose, Bld 181 (H) 65 - 99 mg/dL   BUN 89 (H) 6 - 20 mg/dL   Creatinine, Ser 9.62 (H) 0.44 - 1.00 mg/dL   Calcium 8.8 (L) 8.9 - 10.3 mg/dL   Total Protein 8.4 (H) 6.5 - 8.1 g/dL   Albumin 3.4 (L) 3.5 - 5.0 g/dL   AST 24 15 - 41 U/L   ALT 17 14 - 54 U/L   Alkaline Phosphatase 76 38 - 126 U/L   Total Bilirubin 0.6 0.3 - 1.2 mg/dL   GFR calc non Af Amer 3 (L) >60 mL/min   GFR calc Af Amer 4 (L) >60 mL/min    Comment: (NOTE) The eGFR has been calculated using the CKD EPI equation. This calculation has not been validated in all clinical situations. eGFR's persistently <60 mL/min signify possible Chronic Kidney Disease.    Anion gap 17 (H) 5 - 15  CBC     Status: Abnormal   Collection Time: 01/24/17  5:38 PM  Result Value Ref Range   WBC 11.8 (H) 3.6 - 11.0 K/uL   RBC 3.50 (L) 3.80 - 5.20 MIL/uL   Hemoglobin 11.0 (L) 12.0 - 16.0 g/dL   HCT 34.4 (L) 35.0 - 47.0 %   MCV 98.3 80.0 - 100.0 fL   MCH 31.5 26.0 - 34.0 pg   MCHC  32.0 32.0 - 36.0 g/dL   RDW 16.5 (H) 11.5 - 14.5 %   Platelets 231 150 - 440 K/uL  Urinalysis, Complete w Microscopic     Status: Abnormal   Collection Time: 01/24/17  5:38 PM  Result Value Ref Range   Color, Urine YELLOW (A) YELLOW   APPearance CLOUDY (A) CLEAR   Specific Gravity, Urine 1.010 1.005 - 1.030   pH 6.0 5.0 - 8.0   Glucose, UA NEGATIVE NEGATIVE mg/dL   Hgb urine dipstick MODERATE (A) NEGATIVE   Bilirubin Urine NEGATIVE NEGATIVE   Ketones, ur NEGATIVE NEGATIVE mg/dL   Protein, ur 100 (A) NEGATIVE mg/dL   Nitrite NEGATIVE NEGATIVE   Leukocytes, UA LARGE (A) NEGATIVE   RBC / HPF TOO NUMEROUS TO COUNT 0 - 5 RBC/hpf   WBC, UA TOO NUMEROUS TO COUNT 0 - 5 WBC/hpf   Bacteria, UA NONE SEEN NONE SEEN   Squamous Epithelial / LPF NONE SEEN NONE SEEN   WBC Clumps PRESENT   Lactic acid, plasma     Status: None   Collection Time: 01/24/17  5:39 PM  Result Value Ref Range   Lactic Acid, Venous 1.3 0.5 - 1.9 mmol/L  Culture, blood (Routine x 2)     Status: None (Preliminary result)   Collection Time: 01/24/17  5:39 PM  Result Value Ref Range   Specimen Description BLOOD LEFT HAND    Special Requests      BOTTLES DRAWN AEROBIC AND ANAEROBIC Blood Culture adequate volume   Culture NO GROWTH < 24 HOURS    Report Status PENDING   Culture, blood (Routine x 2)     Status: None (Preliminary result)   Collection Time: 01/24/17  9:29 PM  Result Value Ref Range   Specimen Description BLOOD RIGHT FOREARM    Special Requests      BOTTLES DRAWN AEROBIC AND ANAEROBIC  Blood Culture adequate volume   Culture NO GROWTH < 24 HOURS    Report Status PENDING   Basic metabolic panel     Status: Abnormal   Collection Time: 01/25/17  3:32 AM  Result Value Ref Range   Sodium 136 135 - 145 mmol/L   Potassium 5.2 (H) 3.5 - 5.1 mmol/L   Chloride 108 101 - 111 mmol/L   CO2 14 (L) 22 - 32 mmol/L   Glucose, Bld 108 (H) 65 - 99 mg/dL   BUN 91 (H) 6 - 20 mg/dL   Creatinine, Ser 9.66 (H) 0.44 - 1.00  mg/dL   Calcium 8.7 (L) 8.9 - 10.3 mg/dL   GFR calc non Af Amer 3 (L) >60 mL/min   GFR calc Af Amer 4 (L) >60 mL/min    Comment: (NOTE) The eGFR has been calculated using the CKD EPI equation. This calculation has not been validated in all clinical situations. eGFR's persistently <60 mL/min signify possible Chronic Kidney Disease.    Anion gap 14 5 - 15  CBC     Status: Abnormal   Collection Time: 01/25/17  3:32 AM  Result Value Ref Range   WBC 10.3 3.6 - 11.0 K/uL   RBC 3.27 (L) 3.80 - 5.20 MIL/uL   Hemoglobin 10.6 (L) 12.0 - 16.0 g/dL   HCT 32.3 (L) 35.0 - 47.0 %   MCV 98.9 80.0 - 100.0 fL   MCH 32.3 26.0 - 34.0 pg   MCHC 32.7 32.0 - 36.0 g/dL   RDW 16.5 (H) 11.5 - 14.5 %   Platelets 213 150 - 440 K/uL  Potassium     Status: Abnormal   Collection Time: 01/25/17 10:26 AM  Result Value Ref Range   Potassium 5.4 (H) 3.5 - 5.1 mmol/L   No components found for: ESR, C REACTIVE PROTEIN MICRO: Recent Results (from the past 720 hour(s))  Microscopic Examination     Status: Abnormal   Collection Time: 01/13/17  9:09 AM  Result Value Ref Range Status   WBC, UA >30 (H) 0 - 5 /hpf Final   RBC, UA >30 (H) 0 - 2 /hpf Final   Epithelial Cells (non renal) None seen 0 - 10 /hpf Final   Bacteria, UA Many (A) None seen/Few Final  Culture, blood (Routine x 2)     Status: None (Preliminary result)   Collection Time: 01/24/17  5:39 PM  Result Value Ref Range Status   Specimen Description BLOOD LEFT HAND  Final   Special Requests   Final    BOTTLES DRAWN AEROBIC AND ANAEROBIC Blood Culture adequate volume   Culture NO GROWTH < 24 HOURS  Final   Report Status PENDING  Incomplete  Culture, blood (Routine x 2)     Status: None (Preliminary result)   Collection Time: 01/24/17  9:29 PM  Result Value Ref Range Status   Specimen Description BLOOD RIGHT FOREARM  Final   Special Requests   Final    BOTTLES DRAWN AEROBIC AND ANAEROBIC Blood Culture adequate volume   Culture NO GROWTH < 24 HOURS   Final   Report Status PENDING  Incomplete    IMAGING: Dg Chest 2 View  Result Date: 01/24/2017 CLINICAL DATA:  Fever. Possible infection. COPD and sleep apnea. Bladder cancer. EXAM: CHEST  2 VIEW COMPARISON:  11/19/2016. FINDINGS: Hyperinflation. Right-sided Port-A-Cath unchanged in position with tip at low SVC. Left-sided dialysis catheter new since prior radiograph with tip at low SVC. Midline trachea. Mild cardiomegaly with a tortuous  thoracic aorta. Possible trace bilateral pleural fluid or thickening. Bibasilar volume loss with probable atelectasis, similar. IMPRESSION: Given differences in technique, no significant change since 11/19/2016. Bibasilar volume loss with probable subsegmental atelectasis. Possible trace bilateral pleural fluid or thickening. Cardiomegaly without congestive failure. Electronically Signed   By: Abigail Miyamoto M.D.   On: 01/24/2017 18:21   Nm Pet Image Restag (ps) Skull Base To Thigh  Result Date: 01/01/2017 CLINICAL DATA:  Subsequent treatment strategy for bladder cancer. EXAM: NUCLEAR MEDICINE PET SKULL BASE TO THIGH TECHNIQUE: Twelve point to mCi F-18 FDG was injected intravenously. Full-ring PET imaging was performed from the skull base to thigh after the radiotracer. CT data was obtained and used for attenuation correction and anatomic localization. FASTING BLOOD GLUCOSE:  Value: 86 mg/dl COMPARISON:  CT chest abdomen pelvis 07/10/2016. FINDINGS: NECK No hypermetabolic lymph nodes in the neck. CT images show no acute findings. CHEST No hypermetabolic mediastinal, hilar or axillary lymph nodes. No hypermetabolic pulmonary nodules. Right IJ Port-A-Cath terminates at the SVC RA junction. Left IJ dialysis catheter terminates in the SVC. Atherosclerotic calcification of the arterial vasculature, including coronary arteries. Heart is mildly enlarged. No pericardial or pleural effusion. ABDOMEN/PELVIS No abnormal hypermetabolism in the liver, adrenal glands, spleen or pancreas.  No hypermetabolic lymph nodes. Liver, gallbladder and adrenal glands are grossly unremarkable. Double-J ureteral stents are seen in the renal collecting systems bilaterally with mild to moderate hydronephrosis bilaterally. Bladder is markedly thickened. Stomach and bowel are grossly unremarkable. Atherosclerotic calcification of the arterial vasculature without abdominal aortic aneurysm. SKELETON No abnormal osseous hypermetabolism. Degenerative changes in the spine. IMPRESSION: 1. Marked bladder wall thickening with bilateral hydronephrosis and bilateral double-J ureteral stents in place. No evidence of metastatic disease. 2. Aortic atherosclerosis (ICD10-170.0). Coronary artery calcification. Electronically Signed   By: Lorin Picket M.D.   On: 01/01/2017 13:30    Assessment:   CHALEE HIROTA is a 76 y.o. female with ESRD on HD, bladder cancer s/p recent instrumentation now with weakness, low grade fever and low level leukocytosis. [place] pending - has both a portacath and a HD cath in place. Clincially improving since on IV abx.  She has + UA with TNTC wbc so suspect urinary source given recent cystoscopy and stent removal. UCX pending.  No repeat renal imaging since stents removed.   Recommendations Cont vanco but if bcx tomorrow negative can dc. Continue zosyn pending ucx- can base further abx on ucx result.  If worsens consider repeat Renal imaging and would dw urology. Would consider removing portacath if no longer needed as has two lines in place. But no need to remove lines empirically as long as bcx remain negative.  Thank you very much for allowing me to participate in the care of this patient. Please call with questions.   Cheral Marker. Ola Spurr, MD

## 2017-01-25 NOTE — Progress Notes (Signed)
HD STARTED  

## 2017-01-25 NOTE — Care Management (Signed)
Chronic HD patient.  Barbara [name] HD liaison notified of admission.

## 2017-01-25 NOTE — Progress Notes (Signed)
PRE HD   

## 2017-01-26 ENCOUNTER — Inpatient Hospital Stay: Payer: Medicare Other

## 2017-01-26 DIAGNOSIS — R509 Fever, unspecified: Secondary | ICD-10-CM

## 2017-01-26 DIAGNOSIS — Z959 Presence of cardiac and vascular implant and graft, unspecified: Secondary | ICD-10-CM

## 2017-01-26 DIAGNOSIS — A419 Sepsis, unspecified organism: Secondary | ICD-10-CM

## 2017-01-26 LAB — BASIC METABOLIC PANEL
Anion gap: 9 (ref 5–15)
BUN: 53 mg/dL — AB (ref 6–20)
CALCIUM: 8.3 mg/dL — AB (ref 8.9–10.3)
CO2: 24 mmol/L (ref 22–32)
CREATININE: 6.7 mg/dL — AB (ref 0.44–1.00)
Chloride: 105 mmol/L (ref 101–111)
GFR calc non Af Amer: 5 mL/min — ABNORMAL LOW (ref >60)
GFR, EST AFRICAN AMERICAN: 6 mL/min — AB (ref >60)
Glucose, Bld: 105 mg/dL — ABNORMAL HIGH (ref 65–99)
Potassium: 3.9 mmol/L (ref 3.5–5.1)
SODIUM: 138 mmol/L (ref 135–145)

## 2017-01-26 LAB — CBC
HCT: 30.3 % — ABNORMAL LOW (ref 35.0–47.0)
Hemoglobin: 10.1 g/dL — ABNORMAL LOW (ref 12.0–16.0)
MCH: 32.4 pg (ref 26.0–34.0)
MCHC: 33.4 g/dL (ref 32.0–36.0)
MCV: 96.8 fL (ref 80.0–100.0)
Platelets: 162 10*3/uL (ref 150–440)
RBC: 3.14 MIL/uL — ABNORMAL LOW (ref 3.80–5.20)
RDW: 16.7 % — AB (ref 11.5–14.5)
WBC: 5.4 10*3/uL (ref 3.6–11.0)

## 2017-01-26 LAB — URINE CULTURE: Culture: <10000 — AB

## 2017-01-26 NOTE — Progress Notes (Signed)
Post hd assessment 

## 2017-01-26 NOTE — Progress Notes (Signed)
Pre dialysis

## 2017-01-26 NOTE — Clinical Social Work Note (Signed)
Clinical Social Work Assessment  Patient Details  Name: Barbara [name] MRN: [id number] Date of Birth: 25-Sep-1940  Date of referral:  01/26/17               Reason for consult:  Facility Placement                Permission sought to share information with:    Permission granted to share information::     Name::        Agency::     Relationship::     Contact Information:     Housing/Transportation Living arrangements for the past 2 months:  Single Family Home Source of Information:  Patient Patient Interpreter Needed:  None Criminal Activity/Legal Involvement Pertinent to Current Situation/Hospitalization:  No - Comment as needed Significant Relationships:  Spouse Lives with:  Spouse Do you feel safe going back to the place where you live?  Yes Need for family participation in patient care:  No (Coment)  Care giving concerns:  Patient is from home with her spouse.   Social Worker assessment / plan:  CSW spoke with patient this morning and explained role and purpose of visit. PT has recommended STR but stated to CSW that patient will have 24/7 supervision in the home and could return home with home health. Patient has declined STR and states that she wishes to return home at discharge. Patient is open to having home health. RN CM has been informed.   Employment status:  Retired Nurse, adult PT Recommendations:  [place] / Referral to community resources:     Patient/Family's Response to care:  Patient expressed appreciation for CSW assistance.  Patient/Family's Understanding of and Emotional Response to Diagnosis, Current Treatment, and Prognosis:  Patient is aware of her limitations and believes at this time she is safe to return home.   Emotional Assessment Appearance:  Appears stated age Attitude/Demeanor/Rapport:   (pleasant and cooperative) Affect (typically observed):  Calm, Pleasant Orientation:  Oriented to Self,  Oriented to Place, Oriented to  Time, Oriented to Situation Alcohol / Substance use:  Not Applicable Psych involvement (Current and /or in the community):  No (Comment)  Discharge Needs  Concerns to be addressed:  Care Coordination Readmission within the last 30 days:  No Current discharge risk:  None Barriers to Discharge:  No Barriers Identified   Shela Leff, LCSW 01/26/2017, 9:53 AM

## 2017-01-26 NOTE — Progress Notes (Signed)
  End of hd 

## 2017-01-26 NOTE — Progress Notes (Signed)
Pre dialysis assessment Performed at [id number]

## 2017-01-26 NOTE — Progress Notes (Signed)
[place] [facility] [place]: Barbara [name]    MR#:  [id number]  DATE OF BIRTH:  1941-03-26  SUBJECTIVE:  CHIEF COMPLAINT:   Chief Complaint  Patient presents with  . Abdominal Pain  Feeling much better, no fever, blood pressure improved.  All the cultures negative thus far REVIEW OF SYSTEMS:    Review of Systems  Constitutional: Positive for fever and malaise/fatigue. Negative for chills.  HENT: Negative for sore throat.   Eyes: Negative for blurred vision, double vision and pain.  Respiratory: Negative for cough, hemoptysis, shortness of breath and wheezing.   Cardiovascular: Negative for chest pain, palpitations, orthopnea and leg swelling.  Gastrointestinal: Positive for nausea. Negative for abdominal pain, constipation, diarrhea, heartburn and vomiting.  Genitourinary: Negative for dysuria and hematuria.  Musculoskeletal: Negative for back pain and joint pain.  Skin: Negative for rash.  Neurological: Positive for weakness. Negative for sensory change, speech change, focal weakness and headaches.  Endo/Heme/Allergies: Does not bruise/bleed easily.  Psychiatric/Behavioral: Negative for depression. The patient is not nervous/anxious.    DRUG ALLERGIES:   Allergies  Allergen Reactions  . Glipizide Er     Dropped blood sugar too much  . Percocet [Oxycodone-Acetaminophen] Hives   VITALS:  Blood pressure (!) 127/53, pulse 76, temperature (!) 97.5 F (36.4 C), temperature source Oral, resp. rate 18, height 5' 8"  (1.727 m), weight 71.9 kg (158 lb 8 oz), SpO2 93 %. PHYSICAL EXAMINATION:   Physical Exam  GENERAL:  76 y.o.-year-old patient lying in the bed with no acute distress.  EYES: Pupils equal, round, reactive to light and accommodation. No scleral icterus. Extraocular muscles intact.  HEENT: Head atraumatic, normocephalic. Oropharynx and nasopharynx clear.  NECK:  Supple, no jugular venous distention. No thyroid enlargement, no  tenderness.  LUNGS: Normal breath sounds bilaterally, no wheezing, rales, rhonchi. No use of accessory muscles of respiration. HD Access + CARDIOVASCULAR: S1, S2 normal. No murmurs, rubs, or gallops.  ABDOMEN: Soft, nontender, nondistended. Bowel sounds present. No organomegaly or mass.  EXTREMITIES: No cyanosis, clubbing or edema b/l.    NEUROLOGIC: Cranial nerves II through XII are intact. No focal Motor or sensory deficits b/l.   PSYCHIATRIC: The patient is alert and oriented x 3.  SKIN: No obvious rash, lesion, or ulcer.  LABORATORY PANEL:   CBC  Recent Labs Lab 01/26/17 0554  WBC 5.4  HGB 10.1*  HCT 30.3*  PLT 162   ------------------------------------------------------------------------------------------------------------------ Chemistries   Recent Labs Lab 01/24/17 1738  01/26/17 0554  NA 132*  < > 138  K 4.8  < > 3.9  CL 102  < > 105  CO2 13*  < > 24  GLUCOSE 181*  < > 105*  BUN 89*  < > 53*  CREATININE 9.62*  < > 6.70*  CALCIUM 8.8*  < > 8.3*  AST 24  --   --   ALT 17  --   --   ALKPHOS 76  --   --   BILITOT 0.6  --   --   < > = values in this interval not displayed. ------------------------------------------------------------------------------------------------------------------  Cardiac Enzymes No results for input(s): TROPONINI in the last 168 hours. ------------------------------------------------------------------------------------------------------------------  RADIOLOGY:  Dg Chest 2 View  Result Date: 01/24/2017 CLINICAL DATA:  Fever. Possible infection. COPD and sleep apnea. Bladder cancer. EXAM: CHEST  2 VIEW COMPARISON:  11/19/2016. FINDINGS: Hyperinflation. Right-sided Port-A-Cath unchanged in position with tip at low SVC. Left-sided dialysis catheter new since prior radiograph  with tip at low SVC. Midline trachea. Mild cardiomegaly with a tortuous thoracic aorta. Possible trace bilateral pleural fluid or thickening. Bibasilar volume loss with  probable atelectasis, similar. IMPRESSION: Given differences in technique, no significant change since 11/19/2016. Bibasilar volume loss with probable subsegmental atelectasis. Possible trace bilateral pleural fluid or thickening. Cardiomegaly without congestive failure. Electronically Signed   By: Abigail Miyamoto M.D.   On: 01/24/2017 18:21   ASSESSMENT AND PLAN:  Barbara [name] is a 76 y.o. female with a history of Anemia of chronic disease, bladder cancer, end-stage renal disease on hemodialysis Tuesday Thursday and Saturday, COPD, diabetes, hyperlipidemia, GERD, hypertension now being admitted with:  #. Sepsis: Present on admission secondary to UTI, other possible sources include port and GI tract - continue IV Zosyn and stop Vanco - So far blood cultures and urine culture had been negative - ID input appreciated - May be able to switch her to oral antibiotic tomorrow for discharge.  #. History of end-stage renal disease on hemodialysis: somewhat hypokalemic with potassium of 5.2 - Potassium has been normalized with dialysis -Nephrology consulted for inpatient dialysis  #. History of anemia of chronic kidney disease -Continue iron  #. History of urinary incontinence - bladder cancer and ureteral stents.  - Follows with Dr. Erlene Quan -Continue Mirabegron AND oxybutynin  #. History of GERD - Continue Nexium  # Neuropathy on gabapentin  # History of sleep apnea on oxygen and CPAP at night  # History of type 2 diabetes mellitus without complication. - Monitor blood sugar   Physical therapy Recommends home health PT   All the records are reviewed and case discussed with Care Management/Social Worker Management plans discussed with the patient, nursing, Dr. Candiss Norse and Dr. Ola Spurr and they are in agreement.  CODE STATUS: FULL CODE  DVT Prophylaxis: SCDs  TOTAL TIME TAKING CARE OF THIS PATIENT: 30 minutes.   POSSIBLE D/C IN 1 DAYS, DEPENDING ON CLINICAL CONDITION. And ID  eval    Max Sane M.D on 01/26/2017 at 3:06 PM  Between 7am to 6pm - Pager - [phone]  After 6pm go to [email or web address] - password [email or web address] [facility]  Office  [phone]  CC: Primary care physician; Madelyn Brunner, MD  Note: This dictation was prepared with Dragon dictation along with smaller phrase technology. Any transcriptional errors that result from this process are unintentional.

## 2017-01-26 NOTE — Progress Notes (Signed)
[facility], [place] 01/26/17  Subjective:  Patient known to our practice from outpatient dialysis.  Presents for profound weakness, diarrhea over the weekend.  Missed her treatment on Saturday which is unusual for her. Patient is found to have hyperkalemia as she missed her dialysis on Saturday.     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 She was seen by urologist on June 27.  At that time it was noted that she continues to have urinary frequency, urgency and intermittent gross hematuria.she underwent a cystoscopy, diffusely erythematous bladder, b/l ureteral stents removed  Seen during HD . Tolerating well   HEMODIALYSIS FLOWSHEET:  Blood Flow Rate (mL/min): 400 mL/min Arterial Pressure (mmHg): -180 mmHg Venous Pressure (mmHg): 300 mmHg Transmembrane Pressure (mmHg): 50 mmHg Ultrafiltration Rate (mL/min): 160 mL/min Dialysate Flow Rate (mL/min): 800 ml/min Conductivity: Machine : 14 Conductivity: Machine : 14 Dialysis Fluid Bolus: Normal Saline Bolus Amount (mL): 250 mL Dialysate Change:  (3k 2.5ca d/t k results of 3.9)    Objective:  Vital signs in last 24 hours:  Temp:  [97.5 F (36.4 C)-98.6 F (37 C)] 97.5 F (36.4 C) (07/10 1324) Pulse Rate:  [63-78] 76 (07/10 1327) Resp:  [17-25] 18 (07/10 1324) BP: (88-127)/(49-90) 127/53 (07/10 1324) SpO2:  [90 %-99 %] 93 % (07/10 1327) Weight:  [71.9 kg (158 lb 8 oz)-72.1 kg (158 lb 15.2 oz)] 71.9 kg (158 lb 8 oz) (07/10 1324)  Weight change: 3.646 kg (8 lb 0.6 oz) Filed Weights   01/25/17 1442 01/26/17 0955 01/26/17 1324  Weight: 73.5 kg (162 lb 0.6 oz) 72.1 kg (158 lb 15.2 oz) 71.9 kg (158 lb 8 oz)    Intake/Output:    Intake/Output Summary (Last 24 hours) at 01/26/17 1457 Last data filed at 01/26/17 1300  Gross per 24 hour  Intake              482 ml  Output               500 ml  Net              -18 ml     Physical Exam: General: Chronically ill-appearing, laying in the bed  HEENT Anicteric, moist oral mucous membranes  Neck supple  Pulm/lungs Normal breathing effort, clear to auscultation  CVS/Heart irregular  Abdomen:  Soft, nontender, nondistended  Extremities: No edema  Neurologic: Alert, able to answer questions  Skin: Normal turgor  Access: Left IJ PermCath       Basic Metabolic Panel:   Recent Labs Lab 01/24/17 1738 01/25/17 0332 01/25/17 1026 01/26/17 0554  NA 132* 136  --  138  K 4.8 5.2* 5.4* 3.9  CL 102 108  --  105  CO2 13* 14*  --  24  GLUCOSE 181* 108*  --  105*  BUN 89* 91*  --  53*  CREATININE 9.62* 9.66*  --  6.70*  CALCIUM 8.8* 8.7*  --  8.3*     CBC:  Recent Labs Lab 01/24/17 1738 01/25/17 0332 01/26/17 0554  WBC 11.8* 10.3 5.4  HGB 11.0* 10.6* 10.1*  HCT 34.4* 32.3* 30.3*  MCV 98.3 98.9 96.8  PLT 231 213 162      Lab Results  Component Value Date   HEPBSAG Negative 11/20/2016      Microbiology:  Recent Results (from the past 240 hour(s))  Culture, blood (Routine x 2)  Status: None (Preliminary result)   Collection Time: 01/24/17  5:39 PM  Result Value Ref Range Status   Specimen Description BLOOD LEFT HAND  Final   Special Requests   Final    BOTTLES DRAWN AEROBIC AND ANAEROBIC Blood Culture adequate volume   Culture NO GROWTH 2 DAYS  Final   Report Status PENDING  Incomplete  Culture, blood (Routine x 2)     Status: None (Preliminary result)   Collection Time: 01/24/17  9:29 PM  Result Value Ref Range Status   Specimen Description BLOOD RIGHT FOREARM  Final   Special Requests   Final    BOTTLES DRAWN AEROBIC AND ANAEROBIC Blood Culture adequate volume   Culture NO GROWTH 2 DAYS  Final   Report Status PENDING  Incomplete  Urine culture     Status: Abnormal   Collection Time: 01/24/17 10:10 PM  Result Value Ref Range Status   Specimen Description URINE, RANDOM  Final   Special  Requests NONE  Final   Culture (A)  Final    <10,000 COLONIES/mL INSIGNIFICANT GROWTH Performed at [facility], [place] [address], [place], [place] [address]    Report Status 01/26/2017 FINAL  Final    Coagulation Studies: No results for input(s): LABPROT, INR in the last 72 hours.  Urinalysis:  Recent Labs  01/24/17 1738  COLORURINE YELLOW*  LABSPEC 1.010  PHURINE 6.0  GLUCOSEU NEGATIVE  HGBUR MODERATE*  BILIRUBINUR NEGATIVE  KETONESUR NEGATIVE  PROTEINUR 100*  NITRITE NEGATIVE  LEUKOCYTESUR LARGE*      Imaging: Dg Chest 2 View  Result Date: 01/24/2017 CLINICAL DATA:  Fever. Possible infection. COPD and sleep apnea. Bladder cancer. EXAM: CHEST  2 VIEW COMPARISON:  11/19/2016. FINDINGS: Hyperinflation. Right-sided Port-A-Cath unchanged in position with tip at low SVC. Left-sided dialysis catheter new since prior radiograph with tip at low SVC. Midline trachea. Mild cardiomegaly with a tortuous thoracic aorta. Possible trace bilateral pleural fluid or thickening. Bibasilar volume loss with probable atelectasis, similar. IMPRESSION: Given differences in technique, no significant change since 11/19/2016. Bibasilar volume loss with probable subsegmental atelectasis. Possible trace bilateral pleural fluid or thickening. Cardiomegaly without congestive failure. Electronically Signed   By: Abigail Miyamoto M.D.   On: 01/24/2017 18:21     Medications:   . sodium chloride 50 mL/hr at 01/25/17 2223  . piperacillin-tazobactam (ZOSYN)  IV 3.375 g (01/26/17 1324)   . cholecalciferol  1,000 Units Oral Daily  . ferrous sulfate  325 mg Oral Daily  . gabapentin  600 mg Oral TID  . heparin  5,000 Units Subcutaneous Q8H  . latanoprost  1 drop Both Eyes QHS  . mirabegron ER  25 mg Oral Daily  . oxybutynin  5 mg Oral TID  . pantoprazole  40 mg Oral Daily   albuterol, bisacodyl, ipratropium, lidocaine, ondansetron **OR** ondansetron (ZOFRAN) IV, senna-docusate  Assessment/ Plan:  76  y.o. 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  End-stage renal disease, [place]; TTS/  [facility] 2.  Hyperkalemia 3.  diarrhea 4.  H/o Bladder cancer with recent stent removal and cystoscopy showing grossly erythematous bladder from radiation changes 5.  Anemia of chronic kidney disease 6.  Secondary hyperparathyroidism  Plan: Hyperkalemia has improved Routine hemodialysis today Blood cultures and urine cultures are negative  BP dropped at the beginning of dialysis. No UF. Now that patient is able to eat, will d/c iv fluids EPO with HD        LOS: 2 [facility] 7/10/20182:57 PM  [place], [place]

## 2017-01-26 NOTE — Progress Notes (Signed)
[place] INFECTIOUS DISEASE PROGRESS NOTE Date of Admission:  01/24/2017     ID: Barbara [name] is a 76 y.o. female with possible UTI Active Problems:   Sepsis secondary to UTI ([place])   Subjective: NO fevers, wbc down.   ROS  Eleven systems are reviewed and negative except per hpi  Medications:  Antibiotics Given (last 72 hours)    Date/Time Action Medication Dose Rate   01/24/17 2157 New Bag/Given   piperacillin-tazobactam (ZOSYN) IVPB 3.375 g 3.375 g 100 mL/hr   01/24/17 2243 New Bag/Given   vancomycin (VANCOCIN) IVPB 1000 mg/200 mL premix 1,000 mg 200 mL/hr   01/25/17 1039 New Bag/Given   piperacillin-tazobactam (ZOSYN) IVPB 3.375 g 3.375 g 12.5 mL/hr   01/25/17 2223 New Bag/Given   piperacillin-tazobactam (ZOSYN) IVPB 3.375 g 3.375 g 12.5 mL/hr   01/26/17 1324 New Bag/Given   piperacillin-tazobactam (ZOSYN) IVPB 3.375 g 3.375 g 12.5 mL/hr     . cholecalciferol  1,000 Units Oral Daily  . ferrous sulfate  325 mg Oral Daily  . gabapentin  600 mg Oral TID  . heparin  5,000 Units Subcutaneous Q8H  . latanoprost  1 drop Both Eyes QHS  . mirabegron ER  25 mg Oral Daily  . oxybutynin  5 mg Oral TID  . pantoprazole  40 mg Oral Daily    Objective: Vital signs in last 24 hours: Temp:  [97.5 F (36.4 C)-98.6 F (37 C)] 97.5 F (36.4 C) (07/10 1324) Pulse Rate:  [63-78] 76 (07/10 1327) Resp:  [17-25] 18 (07/10 1324) BP: (88-127)/(49-90) 127/53 (07/10 1324) SpO2:  [90 %-99 %] 93 % (07/10 1327) Weight:  [71.9 kg (158 lb 8 oz)-73.5 kg (162 lb 0.6 oz)] 71.9 kg (158 lb 8 oz) (07/10 1324) Constitutional:  oriented to person, place, and time. appears frail HENT: [place]/AT, PERRLA, no scleral icterus Mouth/Throat: Oropharynx is clear and moist. No oropharyngeal exudate.  Cardiovascular: Normal rate, regular rhythm and normal heart sounds. Exam reveals no gallop and no friction rub.  No murmur heard.  Pulmonary/Chest: Effort normal and breath sounds normal. No respiratory  distress.  has no wheezes.  Neck = supple, no nuchal rigidity HD cath L chest wall wnl, R sided portacath in place Abdominal: Soft. Bowel sounds are normal.  exhibits no distension. There is no tenderness.  Lymphadenopathy: no cervical adenopathy. No axillary adenopathy Neurological: alert and oriented to person, place, and time.  Skin: Skin is warm and dry. No rash noted. No erythema.  Psychiatric: a normal mood and affect.  behavior is normal.   Lab Results  Recent Labs  01/25/17 0332 01/25/17 1026 01/26/17 0554  WBC 10.3  --  5.4  HGB 10.6*  --  10.1*  HCT 32.3*  --  30.3*  NA 136  --  138  K 5.2* 5.4* 3.9  CL 108  --  105  CO2 14*  --  24  BUN 91*  --  53*  CREATININE 9.66*  --  6.70*    Microbiology: Results for orders placed or performed during the hospital encounter of 01/24/17  Culture, blood (Routine x 2)     Status: None (Preliminary result)   Collection Time: 01/24/17  5:39 PM  Result Value Ref Range Status   Specimen Description BLOOD LEFT HAND  Final   Special Requests   Final    BOTTLES DRAWN AEROBIC AND ANAEROBIC Blood Culture adequate volume   Culture NO GROWTH 2 DAYS  Final   Report Status PENDING  Incomplete  Culture, blood (Routine x 2)     Status: None (Preliminary result)   Collection Time: 01/24/17  9:29 PM  Result Value Ref Range Status   Specimen Description BLOOD RIGHT FOREARM  Final   Special Requests   Final    BOTTLES DRAWN AEROBIC AND ANAEROBIC Blood Culture adequate volume   Culture NO GROWTH 2 DAYS  Final   Report Status PENDING  Incomplete  Urine culture     Status: Abnormal   Collection Time: 01/24/17 10:10 PM  Result Value Ref Range Status   Specimen Description URINE, RANDOM  Final   Special Requests NONE  Final   Culture (A)  Final    <10,000 COLONIES/mL INSIGNIFICANT GROWTH Performed at [facility], [place] [address] [place], [place] [address]    Report Status 01/26/2017 FINAL  Final    Studies/Results: Dg Chest 2  View  Result Date: 01/24/2017 CLINICAL DATA:  Fever. Possible infection. COPD and sleep apnea. Bladder cancer. EXAM: CHEST  2 VIEW COMPARISON:  11/19/2016. FINDINGS: Hyperinflation. Right-sided Port-A-Cath unchanged in position with tip at low SVC. Left-sided dialysis catheter new since prior radiograph with tip at low SVC. Midline trachea. Mild cardiomegaly with a tortuous thoracic aorta. Possible trace bilateral pleural fluid or thickening. Bibasilar volume loss with probable atelectasis, similar. IMPRESSION: Given differences in technique, no significant change since 11/19/2016. Bibasilar volume loss with probable subsegmental atelectasis. Possible trace bilateral pleural fluid or thickening. Cardiomegaly without congestive failure. Electronically Signed   By: Abigail Miyamoto M.D.   On: 01/24/2017 18:21    Assessment/Plan: Barbara [name] is a 76 y.o. female with ESRD on HD, bladder cancer s/p recent instrumentation now with weakness, low grade fever and low level leukocytosis. [place] pending - has both a portacath and a HD cath in place. Clincially improving since on IV abx.  She has + UA with TNTC wbc so suspect urinary source given recent cystoscopy and stent removal. UCX pending.  No repeat renal imaging since stents removed.   Recommendations Would dc on IV ceftazidime which be given easily at HD for a total 10 day course If worsens consider repeat Renal imaging and would dw urology. Continue to monitor cultures. Thank you very much for the consult. Will follow with you.  Langford Carias P   01/26/2017, 2:37 PM

## 2017-01-26 NOTE — Progress Notes (Addendum)
Physical Therapy Treatment Patient Details Name: LOUISIANA SEARLES MRN: [id number] DOB: May 30, 1941 Today's Date: 01/26/2017    History of Present Illness Tawnee Clegg is a 76 y.o. female with a known history of anemia of chronic disease, bladder cancer, end-stage renal disease on hemodialysis Tuesday Thursday and Saturday, COPD, diabetes, hyperlipidemia, GERD, hypertension presents to the emergency department for evaluation of generalized weakness.  Patient was in a usual state of health until the past 2-3 days when she describes feeling generally unwell, lethargic and globally weak. At baseline, she is functionally independent but for the past 2 days she has required assistance with activities of daily living and has been using her rollator for ambulation. She is now admitted for sepsis secondary to UTI and is currently on enteric precautions    PT Comments    Pt agreeable to PT; denies pain and reports feeling stronger today. Pt demonstrates Mod I with bed mobility and participates in seated exercises at the edge of the bed well with minimal rest between exercises. Pt requires supervision with cues for safe hand placement for stand transfer. Ambulates with decreased speed and step lengths with Min guard. Received up in the chair comfortably. Continue PT to progress strength, endurance and quality of gait to improve all functional mobility. Discussed discharge plan change with CM.   Follow Up Recommendations   HHPT     Equipment Recommendations    None (has rw)   Recommendations for Other Services       Precautions / Restrictions Precautions Precautions: Fall;Other (comment) Restrictions Weight Bearing Restrictions: No    Mobility  Bed Mobility Overal bed mobility: Modified Independent                Transfers Overall transfer level: Needs assistance Equipment used: Rolling walker (2 wheeled) Transfers: Sit to/from Stand Sit to Stand: Supervision         General transfer  comment: cues for safe hand placement  Ambulation/Gait Ambulation/Gait assistance: Min guard Ambulation Distance (Feet): 30 Feet Assistive device: Rolling walker (2 wheeled) Gait Pattern/deviations: Step-through pattern;Decreased stride length;Trunk flexed Gait velocity: Decreased Gait velocity interpretation: <1.8 ft/sec, indicative of risk for recurrent falls General Gait Details: Small step thru steps with mild forward trunk posture. No overt LOB, steady with moderate reliance on UEs.    Stairs            Wheelchair Mobility    Modified Rankin (Stroke Patients Only)       Balance Overall balance assessment: Needs assistance Sitting-balance support: No upper extremity supported;Feet supported Sitting balance-Leahy Scale: Good     Standing balance support: Bilateral upper extremity supported Standing balance-Leahy Scale: Fair                              Cognition Arousal/Alertness: Awake/alert Behavior During Therapy: WFL for tasks assessed/performed Overall Cognitive Status: Within Functional Limits for tasks assessed                                 General Comments: Reports feeling stronger today      Exercises General Exercises - Lower Extremity Gluteal Sets: Strengthening;Both;20 reps;Seated Long Arc Quad: Strengthening;Both;10 reps;Seated (2 sets each) Hip ABduction/ADduction: Strengthening;Both;10 reps;Seated (2 sets each, single straight leg) Straight Leg Raises: Strengthening;Both;10 reps;Seated (2 sets) Hip Flexion/Marching: AROM;Strengthening;Both;20 reps;Seated Toe Raises: AROM;Left;20 reps;Seated Heel Raises: AROM;Both;20 reps;Seated    General Comments  Pertinent Vitals/Pain Pain Assessment: No/denies pain    Home Living                      Prior Function            PT Goals (current goals can now be found in the care plan section) Progress towards PT goals: Progressing toward goals     Frequency    Min 2X/week      PT Plan Current plan remains appropriate    Co-evaluation              AM-PAC PT "6 Clicks" Daily Activity  Outcome Measure  Difficulty turning over in bed (including adjusting bedclothes, sheets and blankets)?: None Difficulty moving from lying on back to sitting on the side of the bed? : None Difficulty sitting down on and standing up from a chair with arms (e.g., wheelchair, bedside commode, etc,.)?: A Little Help needed moving to and from a bed to chair (including a wheelchair)?: A Little Help needed walking in hospital room?: A Little Help needed climbing 3-5 steps with a railing? : A Lot 6 Click Score: 19    End of Session Equipment Utilized During Treatment: Gait belt Activity Tolerance: Patient tolerated treatment well Patient left: in chair;with call bell/phone within reach;with chair alarm set   PT Visit Diagnosis: Unsteadiness on feet (R26.81);Muscle weakness (generalized) (M62.81)     Time: 5732-2567 PT Time Calculation (min) (ACUTE ONLY): 30 min  Charges:  $Gait Training: 8-22 mins $Therapeutic Exercise: 8-22 mins                    G Codes:        Larae Grooms, PTA 01/26/2017, 2:02 PM

## 2017-01-26 NOTE — Consult Note (Signed)
SURGICAL CONSULTATION NOTE (initial) - cpt: [id number]  HISTORY OF PRESENT ILLNESS (HPI):  76 y.o. female presented to [facility] ED with abdominal pain, weakness, and increasing urination without dysuria after missing a session of HD. Patient also recently underwent cystoscopy and removal of ureteral stents placed for persistent hydronephrosis in the context of prior bladder CA, for which tunneled Right IJ CVC with subcutaneous port was placed. Patient reports the port has not been used for nearly a year and has not recently been flushed. She denies any pain or redness around the port site, but states she wants it removed and dislikes feeling the port and catheter under her skin. Patient otherwise denies any N/V, fever/chills, CP, or SOB since admission.   Surgery is consulted by medical physician Dr. Manuella Ghazi in this context for evaluation and management of tunneled Right IJ central venous catheter with subcutaneous port.  PAST MEDICAL HISTORY (PMH):  Past Medical History:  Diagnosis Date  . Anemia associated with chronic renal failure 2017   blood transfusion last week 10/17  . Arthritis   . Cancer ([place]) 2017   bladder  . Chronic kidney disease   . 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  dr. Candiss Norse and dr. Holley Raring follow her  . COPD (chronic obstructive pulmonary disease) ([place])   . Diabetes mellitus without complication ([place])   . Dyspnea    with exertion  . Elevated lipids   . GERD (gastroesophageal reflux disease)   . Hematuria   . Hypertension   . Lower back pain   . Oxygen dependent    at hs  . Personal history of chemotherapy   . Sleep apnea    uses cpap  . Urinary obstruction 01/2016     PAST SURGICAL HISTORY [facility]):  Past Surgical History:  Procedure Laterality Date  . ABDOMINAL HYSTERECTOMY    . CYSTOSCOPY W/ RETROGRADES Bilateral 02/17/2016   Procedure: CYSTOSCOPY WITH RETROGRADE PYELOGRAM;  Surgeon: Hollice Espy, MD;  Location: [facility] ORS;  Service: Urology;   Laterality: Bilateral;  . CYSTOSCOPY W/ RETROGRADES Bilateral 10/12/2016   Procedure: CYSTOSCOPY WITH RETROGRADE PYELOGRAM;  Surgeon: Hollice Espy, MD;  Location: [facility] ORS;  Service: Urology;  Laterality: Bilateral;  . CYSTOSCOPY W/ URETERAL STENT PLACEMENT Left 05/12/2016   Procedure: CYSTOSCOPY WITH STENT REPLACEMENT;  Surgeon: Hollice Espy, MD;  Location: [facility] ORS;  Service: Urology;  Laterality: Left;  . CYSTOSCOPY W/ URETERAL STENT PLACEMENT Left 10/12/2016   Procedure: CYSTOSCOPY WITH STENT REPLACEMENT;  Surgeon: Hollice Espy, MD;  Location: [facility] ORS;  Service: Urology;  Laterality: Left;  . CYSTOSCOPY WITH STENT PLACEMENT Left 01/21/2016   Procedure: CYSTOSCOPY WITH double J STENT PLACEMENT;  Surgeon: Franchot Gallo, MD;  Location: [facility] ORS;  Service: Urology;  Laterality: Left;  . CYSTOSCOPY WITH STENT PLACEMENT Right 10/12/2016   Procedure: CYSTOSCOPY WITH STENT PLACEMENT;  Surgeon: Hollice Espy, MD;  Location: [facility] ORS;  Service: Urology;  Laterality: Right;  . DIALYSIS/PERMA CATHETER INSERTION N/A 11/20/2016   Procedure: Dialysis/Perma Catheter Insertion;  Surgeon: Algernon Huxley, MD;  Location: [facility];  Service: Cardiovascular;  Laterality: N/A;  . KIDNEY SURGERY  01/21/2016   IR NEPHROSTOMY PLACEMENT LEFT   . PERIPHERAL VASCULAR CATHETERIZATION N/A 04/07/2016   Procedure: Glori Luis Cath Insertion;  Surgeon: Katha Cabal, MD;  Location: [facility];  Service: Cardiovascular;  Laterality: N/A;  . ROTATOR CUFF REPAIR     both sides  . TRANSURETHRAL RESECTION OF BLADDER TUMOR N/A 02/17/2016  Procedure: TRANSURETHRAL RESECTION OF BLADDER TUMOR (TURBT)-LARGE;  Surgeon: Hollice Espy, MD;  Location: [facility];  Service: Urology;  Laterality: N/A;  . URETEROSCOPY Left 02/17/2016   Procedure: URETEROSCOPY;  Surgeon: Hollice Espy, MD;  Location: [facility];  Service: Urology;  Laterality: Left;  . URETEROSCOPY Right 10/12/2016   Procedure: URETEROSCOPY;  Surgeon:  Hollice Espy, MD;  Location: [facility] OR[facility];  Service: Urology;  Laterality: Right;     MEDICATIONS:  Prior to Admission medications   Medication Sig Start Date End Date Taking? Authorizing Provider  acetaminophen (TYLENOL) 500 MG tablet Take 500 mg by mouth every 6 (six) hours as needed for moderate pain or headache.   Yes [provider]  Albuterol Sulfate (PROAIR RESPICLICK) 409 (90 Base) MCG/ACT AEPB Inhale 2 puffs into the lungs 4 (four) times daily as needed (wheezing).    Yes [provider]  cholecalciferol (VITAMIN D) 1000 units tablet Take 1,000 Units by mouth daily.   Yes [provider]  esomeprazole (NEXIUM) 40 MG capsule Take 40 mg by mouth daily at 12 noon.   Yes [provider]  ferrous sulfate 325 (65 FE) MG EC tablet Take 325 mg by mouth daily.   Yes [provider]  gabapentin (NEURONTIN) 300 MG capsule Take 600 mg by mouth 3 (three) times daily.   Yes [provider]  lidocaine (XYLOCAINE) 5 % ointment Apply 1 application topically daily as needed for mild pain.  05/21/16  Yes [provider]  MYRBETRIQ 25 MG TB24 tablet TAKE 1 TABLET (25 MG TOTAL) BY MOUTH DAILY. 06/10/16  Yes Chrystal, Eulas Post, MD  NON FORMULARY Place 1 Units into the nose at bedtime. CPAP Time of use 2100-0600   Yes [provider]  oxybutynin (DITROPAN) 5 MG tablet Take 5 mg by mouth 3 (three) times daily.   Yes [provider]  OXYGEN Inhale 2 L into the lungs at bedtime.   Yes [provider]  TRAVATAN Z 0.004 % SOLN ophthalmic solution PLACE 1 DROP INTO BOTH EYES EVERY NIGHT AT BEDTIME 07/12/16  Yes [provider]  calcium acetate (PHOSLO) 667 MG capsule Take 2 capsules (1,334 mg total) by mouth 3 (three) times daily with meals. Patient not taking: Reported on 01/24/2017 11/23/16   Fritzi Mandes, MD     ALLERGIES:  Allergies  Allergen Reactions  . Glipizide Er     Dropped blood sugar too much  . Percocet  [Oxycodone-Acetaminophen] Hives     SOCIAL HISTORY:  Social History   Social History  . Marital status: Married    Spouse name: N/A  . Number of children: N/A  . Years of education: N/A   Occupational History  . retired    Social History Main Topics  . Smoking status: Former Smoker    Types: Cigarettes    Quit date: 07/20/2002  . Smokeless tobacco: Never Used     Comment: 01/22/2016   "  quit smoking many years ago "  . Alcohol use No  . Drug use: No  . Sexual activity: No   Other Topics Concern  . Not on file   Social History Narrative  . No narrative on file    The patient currently resides (home / rehab facility / nursing home): Home  The patient normally is (ambulatory / bedbound): Ambulatory   FAMILY HISTORY:  Family History  Problem Relation Age of Onset  . Diabetes Mother   . Cancer Father   . Breast cancer Neg Hx   .  Kidney disease Neg Hx     REVIEW OF SYSTEMS:  Constitutional: denies weight loss, fever, chills, or sweats  Eyes: denies any other vision changes, history of eye injury  ENT: denies sore throat, hearing problems  Respiratory: denies shortness of breath, wheezing  Cardiovascular: denies chest pain, palpitations  Gastrointestinal: denies abdominal pain, N/V, or diarrhea Genitourinary: denies burning with urination or urinary frequency Musculoskeletal: denies any other joint pains or cramps  Skin: denies any other rashes or skin discolorations  Neurological: denies any other headache, dizziness, weakness  Psychiatric: denies any other depression, anxiety   All other review of systems were negative   VITAL SIGNS:  Temp:  [97.5 F (36.4 C)-98.6 F (37 C)] 97.5 F (36.4 C) (07/10 1324) Pulse Rate:  [63-78] 76 (07/10 1327) Resp:  [17-25] 18 (07/10 1324) BP: (88-127)/(49-90) 127/53 (07/10 1324) SpO2:  [90 %-99 %] 93 % (07/10 1327) Weight:  [158 lb 8 oz (71.9 kg)-158 lb 15.2 oz (72.1 kg)] 158 lb 8 oz (71.9 kg) (07/10 1324)     Height: 5' 8"   (172.7 cm) Weight: 158 lb 8 oz (71.9 kg) BMI (Calculated): 23.5   INTAKE/OUTPUT:  This shift: Total I/O In: 78 [I.V.:78] Out: 100 [Other:100]  Last 2 shifts: @IOLAST2SHIFTS @   PHYSICAL EXAM:  Constitutional:  -- Normal body habitus  -- Awake, alert, and oriented x3  Eyes:  -- Pupils equally round and reactive to light  -- No scleral icterus  Ear, nose, and throat:  -- No jugular venous distension  Pulmonary:  -- No crackles  -- Equal breath sounds bilaterally -- Breathing non-labored at rest Cardiovascular:  -- S1, S2 present  -- No pericardial rubs Gastrointestinal:  -- Abdomen soft, nontender, nondistended, no guarding/rebound  -- No abdominal masses appreciated, pulsatile or otherwise  Musculoskeletal and Integumentary:  -- Wounds or skin discoloration: tunneled Right IJ central venous catheter with subcutaneous port NT without any erythema and incision well-approximated without any surrounding erythema or drainage -- Extremities: B/L UE and LE FROM, hands and feet warm, no edema  Neurologic:  -- Motor function: intact and symmetric -- Sensation: intact and symmetric  Labs:  CBC Latest Ref Rng & Units 01/26/2017 01/25/2017 01/24/2017  WBC 3.6 - 11.0 K/uL 5.4 10.3 11.8(H)  Hemoglobin 12.0 - 16.0 g/dL 10.1(L) 10.6(L) 11.0(L)  Hematocrit 35.0 - 47.0 % 30.3(L) 32.3(L) 34.4(L)  Platelets 150 - 440 K/uL 162 213 231   CMP Latest Ref Rng & Units 01/26/2017 01/25/2017 01/25/2017  Glucose 65 - 99 mg/dL 105(H) - 108(H)  BUN 6 - 20 mg/dL 53(H) - 91(H)  Creatinine 0.44 - 1.00 mg/dL 6.70(H) - 9.66(H)  Sodium 135 - 145 mmol/L 138 - 136  Potassium 3.5 - 5.1 mmol/L 3.9 5.4(H) 5.2(H)  Chloride 101 - 111 mmol/L 105 - 108  CO2 22 - 32 mmol/L 24 - 14(L)  Calcium 8.9 - 10.3 mg/dL 8.3(L) - 8.7(L)  Total Protein 6.5 - 8.1 g/dL - - -  Total Bilirubin 0.3 - 1.2 mg/dL - - -  Alkaline Phos 38 - 126 U/L - - -  AST 15 - 41 U/L - - -  ALT 14 - 54 U/L - - -   Blood Cultures (01/24/2017) No growth x2  days  Urine Culture (01/24/2017) <10,000 colonies/mL (insignificant growth)  Assessment/Plan: (ICD-10's: Z95.9) 76 y.o. female with tunneled Right IJ central venous catheter no longer needed for chemotherapy and without any evidence to suggest infection, complicated by low-grade fever and leukocytosis along with bladder CA s/p recent  instrumentation, ESRD on HD via tunneled hemodialysis catheter, and pertinent comorbidities including DM, HTN, HLD, COPD on home O2, osteoarthritis, and chronic back pain.   - antibiotics and medical management per ID and medical team  - no indication for urgent removal of no-longer-needed tunneled Right IJ CVC with port  - patient expresses wish to have her no-longer-needed Right IJ port removed outpatient  - outpatient surgical port removal upon resolution of acute issues  - please call if any questions or concerns  All of the above findings and recommendations were discussed with the patient, and all of patient's questions were answered to her expressed satisfaction.  Thank you for the opportunity to participate in this patient's care.   -- Marilynne Drivers Rosana Hoes, MD, [place]: [facility] - Partnering for exceptional care. Office: [phone]

## 2017-01-26 NOTE — Progress Notes (Signed)
HD initiated via L Chest HD cath. Arterial port sluggish but flushes well. Lines reversed

## 2017-01-26 NOTE — Progress Notes (Signed)
Post hd vitals

## 2017-01-26 NOTE — Care Management (Signed)
Patient admitted from home with sepsis.  .  Patient lives at home with husband.  PCP walker.  [facility].  Per HD liaison patient goes to [facility] MWF.  PT has assessed patient.  Initially they were recommending SNF, however patient has improved, and no recommending home health PT.  Patient was also declining SNF placement.  Patient states that she has a RW, WC and cane in the home for ambulation. Patient open with Home health Services through [facility].  [place] with [facility] notified of admission.  Patient would like to continue services. RNCM following.

## 2017-01-26 NOTE — Progress Notes (Signed)
PT Cancellation Note  Patient Details Name: AILI CASILLAS MRN: [id number] DOB: 1941/04/03   Cancelled Treatment:    Reason Eval/Treat Not Completed: Patient at procedure or test/unavailable. Treatment attempted, pt out of the room/unavailable. Re attempt at a later time/date, as the schedule allows.    Larae Grooms, PTA 01/26/2017, 12:41 PM

## 2017-01-27 LAB — BASIC METABOLIC PANEL
ANION GAP: 10 (ref 5–15)
BUN: 39 mg/dL — ABNORMAL HIGH (ref 6–20)
CHLORIDE: 102 mmol/L (ref 101–111)
CO2: 25 mmol/L (ref 22–32)
Calcium: 8.6 mg/dL — ABNORMAL LOW (ref 8.9–10.3)
Creatinine, Ser: 5.6 mg/dL — ABNORMAL HIGH (ref 0.44–1.00)
GFR calc non Af Amer: 7 mL/min — ABNORMAL LOW (ref >60)
GFR, EST AFRICAN AMERICAN: 8 mL/min — AB (ref >60)
Glucose, Bld: 93 mg/dL (ref 65–99)
POTASSIUM: 4.3 mmol/L (ref 3.5–5.1)
SODIUM: 137 mmol/L (ref 135–145)

## 2017-01-27 LAB — GLUCOSE, CAPILLARY
GLUCOSE-CAPILLARY: 106 mg/dL — AB (ref 65–99)
GLUCOSE-CAPILLARY: 111 mg/dL — AB (ref 65–99)
GLUCOSE-CAPILLARY: 129 mg/dL — AB (ref 65–99)
Glucose-Capillary: 102 mg/dL — ABNORMAL HIGH (ref 65–99)

## 2017-01-27 LAB — CBC
HEMATOCRIT: 29.8 % — AB (ref 35.0–47.0)
HEMOGLOBIN: 9.7 g/dL — AB (ref 12.0–16.0)
MCH: 31.9 pg (ref 26.0–34.0)
MCHC: 32.7 g/dL (ref 32.0–36.0)
MCV: 97.5 fL (ref 80.0–100.0)
Platelets: 140 10*3/uL — ABNORMAL LOW (ref 150–440)
RBC: 3.05 MIL/uL — AB (ref 3.80–5.20)
RDW: 16.6 % — ABNORMAL HIGH (ref 11.5–14.5)
WBC: 4.2 10*3/uL (ref 3.6–11.0)

## 2017-01-27 LAB — MRSA PCR SCREENING: MRSA by PCR: NEGATIVE

## 2017-01-27 MED ORDER — INSULIN ASPART 100 UNIT/ML ~~LOC~~ SOLN: 0.0000 [IU] | Freq: Three times a day (TID) | SUBCUTANEOUS | Status: DC

## 2017-01-27 MED ORDER — DEXTROSE 5 % IV SOLN
2.0000 g | Freq: Once | INTRAVENOUS | Status: DC
  Filled 2017-01-27: qty 2

## 2017-01-27 MED ORDER — DEXTROSE 5 % IV SOLN
2.0000 g | INTRAVENOUS | Status: DC
  Filled 2017-01-27: qty 2

## 2017-01-27 MED ORDER — INSULIN ASPART 100 UNIT/ML ~~LOC~~ SOLN: 0.0000 [IU] | Freq: Every day | SUBCUTANEOUS | Status: DC

## 2017-01-27 MED ORDER — CEFTAZIDIME AND DEXTROSE 2 GM/50ML IV SOLR
2.0000 g | Freq: Once | INTRAVENOUS | Status: AC
  Administered 2017-01-27: 2 g via INTRAVENOUS
  Filled 2017-01-27 (×2): qty 50

## 2017-01-27 MED ORDER — IBUPROFEN 400 MG PO TABS
400.0000 mg | ORAL_TABLET | Freq: Four times a day (QID) | ORAL | Status: DC | PRN
  Administered 2017-01-27: 400 mg via ORAL
  Filled 2017-01-27: qty 1

## 2017-01-27 NOTE — Progress Notes (Signed)
PT Cancellation Note  Patient Details Name: Barbara [name] MRN: [id number] DOB: 09-28-1940   Cancelled Treatment:    Reason Eval/Treat Not Completed: Patient declined, no reason specified. Pt up in chair, but refused PT due to R knee pain. Pt notes she hurt knee last night and noted pain and swelling this morn. Pt wishes to defer PT until tomorrow.    Larae Grooms, PTA 01/27/2017, 12:45 PM

## 2017-01-27 NOTE — Progress Notes (Signed)
Pharmacy Antibiotic Note  Barbara [name] is a 76 y.o. female admitted on 01/24/2017 with sepsis.  Pharmacy has been consulted for Zosyn dosing.  Plan: Continue Zosyn 3.375 gm IV Q12H EI.  Height: 5' 8"  (172.7 cm) Weight: 158 lb 8 oz (71.9 kg) IBW/kg (Calculated) : 63.9  Temp (24hrs), Avg:97.9 F (36.6 C), Min:97.5 F (36.4 C), Max:98.1 F (36.7 C)   Recent Labs Lab 01/24/17 1738 01/24/17 1739 01/25/17 0332 01/26/17 0554 01/27/17 0543  WBC 11.8*  --  10.3 5.4 4.2  CREATININE 9.62*  --  9.66* 6.70* 5.60*  LATICACIDVEN  --  1.3  --   --   --     Estimated Creatinine Clearance: 8.5 mL/min (A) (by C-G formula based on SCr of 5.6 mg/dL (H)).    Allergies  Allergen Reactions  . Glipizide Er     Dropped blood sugar too much  . Percocet [Oxycodone-Acetaminophen] Hives   Thank you for allowing pharmacy to be a part of this patient's care.  Ulice Dash D, Pharm.D., BCPS Clinical Pharmacist 01/27/2017 10:15 AM

## 2017-01-27 NOTE — Progress Notes (Signed)
[facility], [place] 01/27/17  Subjective:  Patient known to our practice from outpatient dialysis.  Presents for profound weakness, diarrhea over the weekend.  Missed her treatment on Saturday. 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 She was seen by urologist on June 27.  At that time it was noted that she continues to have urinary frequency, urgency and intermittent gross hematuria.she underwent a cystoscopy, diffusely erythematous bladder, b/l ureteral stents removed   Yesterday during vein mapping an incidental IJ clot was discovered. Patient has Left IJ permcath on the same side Today, she is doing better She denies any SOB She is able to tolerate diet Her main complaint today is pain in right knee   Objective:  Vital signs in last 24 hours:  Temp:  [97.5 F (36.4 C)-98.1 F (36.7 C)] 98.1 F (36.7 C) (07/11 0514) Pulse Rate:  [63-76] 74 (07/11 0514) Resp:  [17-25] 17 (07/11 0514) BP: (100-127)/(46-65) 105/46 (07/11 0514) SpO2:  [93 %-98 %] 96 % (07/11 0514) Weight:  [71.9 kg (158 lb 8 oz)] 71.9 kg (158 lb 8 oz) (07/10 1324)  Weight change: -1.4 kg (-3 lb 1.4 oz) Filed Weights   01/25/17 1442 01/26/17 0955 01/26/17 1324  Weight: 73.5 kg (162 lb 0.6 oz) 72.1 kg (158 lb 15.2 oz) 71.9 kg (158 lb 8 oz)    Intake/Output:    Intake/Output Summary (Last 24 hours) at 01/27/17 1101 Last data filed at 01/27/17 0936  Gross per 24 hour  Intake              173 ml  Output              100 ml  Net               73 ml     Physical Exam: General: Chronically ill-appearing, sitting up in chair  HEENT Anicteric, moist oral mucous membranes  Neck supple  Pulm/lungs Normal breathing effort, clear to auscultation  CVS/Heart irregular  Abdomen:  Soft, nontender, nondistended   Extremities: No edema  Neurologic: Alert, able to answer questions  Skin: Normal turgor  Access: Left IJ PermCath, rt IJ chemo port       Basic Metabolic Panel:   Recent Labs Lab 01/24/17 1738 01/25/17 0332 01/25/17 1026 01/26/17 0554 01/27/17 0543  NA 132* 136  --  138 137  K 4.8 5.2* 5.4* 3.9 4.3  CL 102 108  --  105 102  CO2 13* 14*  --  24 25  GLUCOSE 181* 108*  --  105* 93  BUN 89* 91*  --  53* 39*  CREATININE 9.62* 9.66*  --  6.70* 5.60*  CALCIUM 8.8* 8.7*  --  8.3* 8.6*     CBC:  Recent Labs Lab 01/24/17 1738 01/25/17 0332 01/26/17 0554 01/27/17 0543  WBC 11.8* 10.3 5.4 4.2  HGB 11.0* 10.6* 10.1* 9.7*  HCT 34.4* 32.3* 30.3* 29.8*  MCV 98.3 98.9 96.8 97.5  PLT 231 213 162 140*      Lab Results  Component Value Date   HEPBSAG Negative 11/20/2016      Microbiology:  Recent Results (from the past 240 hour(s))  Culture, blood (Routine x 2)     Status: None (Preliminary result)   Collection Time: 01/24/17  5:39 PM  Result Value Ref Range Status  Specimen Description BLOOD LEFT HAND  Final   Special Requests   Final    BOTTLES DRAWN AEROBIC AND ANAEROBIC Blood Culture adequate volume   Culture NO GROWTH 3 DAYS  Final   Report Status PENDING  Incomplete  Culture, blood (Routine x 2)     Status: None (Preliminary result)   Collection Time: 01/24/17  9:29 PM  Result Value Ref Range Status   Specimen Description BLOOD RIGHT FOREARM  Final   Special Requests   Final    BOTTLES DRAWN AEROBIC AND ANAEROBIC Blood Culture adequate volume   Culture NO GROWTH 3 DAYS  Final   Report Status PENDING  Incomplete  Urine culture     Status: Abnormal   Collection Time: 01/24/17 10:10 PM  Result Value Ref Range Status   Specimen Description URINE, RANDOM  Final   Special Requests NONE  Final   Culture (A)  Final    <10,000 COLONIES/mL INSIGNIFICANT GROWTH Performed at [facility], [place] [address], [place], [place] [address]    Report Status  01/26/2017 FINAL  Final  MRSA PCR Screening     Status: None   Collection Time: 01/27/17  5:25 AM  Result Value Ref Range Status   MRSA by PCR NEGATIVE NEGATIVE Final    Comment:        The GeneXpert MRSA Assay (FDA approved for NASAL specimens only), is one component of a comprehensive MRSA colonization surveillance program. It is not intended to diagnose MRSA infection nor to guide or monitor treatment for MRSA infections.     Coagulation Studies: No results for input(s): LABPROT, INR in the last 72 hours.  Urinalysis:  Recent Labs  01/24/17 1738  COLORURINE YELLOW*  LABSPEC 1.010  PHURINE 6.0  GLUCOSEU NEGATIVE  HGBUR MODERATE*  BILIRUBINUR NEGATIVE  KETONESUR NEGATIVE  PROTEINUR 100*  NITRITE NEGATIVE  LEUKOCYTESUR LARGE*      Imaging: No results found.   Medications:   . piperacillin-tazobactam (ZOSYN)  IV 3.375 g (01/27/17 0941)   . cholecalciferol  1,000 Units Oral Daily  . ferrous sulfate  325 mg Oral Daily  . gabapentin  600 mg Oral TID  . heparin  5,000 Units Subcutaneous Q8H  . insulin aspart  0-5 Units Subcutaneous QHS  . insulin aspart  0-9 Units Subcutaneous TID WC  . latanoprost  1 drop Both Eyes QHS  . mirabegron ER  25 mg Oral Daily  . oxybutynin  5 mg Oral TID  . pantoprazole  40 mg Oral Daily   albuterol, bisacodyl, ibuprofen, ipratropium, lidocaine, ondansetron **OR** ondansetron (ZOFRAN) IV, senna-docusate  Assessment/ Plan:  76 y.o. 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  End-stage renal disease, [place]; TTS/  [facility] 2.  Hyperkalemia 3.  diarrhea 4.  Hematuria- H/o Bladder cancer with recent stent removal and cystoscopy(12/2016) showing grossly  erythematous bladder from radiation changes 5.  Anemia of chronic kidney disease 6.  Secondary hyperparathyroidism 7. Rt IJ thrombus- incidental finding  Plan: Hyperkalemia has improved Routine hemodialysis on Thursday Blood cultures and urine cultures are negative EPO with HD   Discussed with vascular surgery, IJ clot is probably catheter related. She is at high risk of bleeding with anticoagulation Will continue to proceed with evaluation for access placement  LOS: 3 Voncille Simm 7/11/201811:01 AM  [facility] [place], [place]

## 2017-01-27 NOTE — Progress Notes (Signed)
Occupational Therapy Treatment Patient Details Name: Barbara [name] MRN: [id number] DOB: 01/26/1941 Today's Date: 01/27/2017    History of present illness Barbara [name] is a 76 y.o. female with a known history of anemia of chronic disease, bladder cancer, end-stage renal disease on hemodialysis Tuesday Thursday and Saturday, COPD, diabetes, hyperlipidemia, GERD, hypertension presents to the emergency department for evaluation of generalized weakness.  Patient was in a usual state of health until the past 2-3 days when she describes feeling generally unwell, lethargic and globally weak. At baseline, she is functionally independent but for the past 2 days she has required assistance with activities of daily living and has been using her rollator for ambulation. She is now admitted for sepsis secondary to UTI and is currently on enteric precautions   OT comments  Pt. Education was provided about energy conservation, and work simplification techniques for ADLs, and IADLs. A visual handout was provided to the patient. Pt. reported she believes elastic shoelaces would be helpful to her. Pt. was assisted with repositioning in the recliner, and offered a pillow under her LEs secondary to having pain. Pain was significantly reduced as a result. Pt. Continues to benefit from skilled OT services to follow -up with energy conservation/work simplification techniques for ADLs, and IADLs. Pt. Would prefer to return home with home health rather than go to SNF. Pt. Could benefit from follow-up OT services upon discharge.   Follow Up Recommendations  SNF    Equipment Recommendations  Toilet rise with handles;Tub/shower seat    Recommendations for Other Services      Precautions / Restrictions Restrictions Weight Bearing Restrictions: No                                                       ADL either performed or assessed with clinical judgement   ADL Overall ADL's : Needs  assistance/impaired Eating/Feeding: Set up;Sitting   Grooming: Set up;Sitting   Upper Body Bathing: Set up;Supervision/ safety   Lower Body Bathing: Set up;Minimal assistance   Upper Body Dressing : Set up;Supervision/safety   Lower Body Dressing: Minimal assistance;Set up               Functional mobility during ADLs: Min guard General ADL Comments: Pt. education was provided about A/E use for LE ADLs.      Vision Baseline Vision/History: No visual deficits Patient Visual Report: No change from baseline     Perception     Praxis      Cognition Arousal/Alertness: Awake/alert Behavior During Therapy: WFL for tasks assessed/performed Overall Cognitive Status: Within Functional Limits for tasks assessed                                          Exercises     Shoulder Instructions       General Comments      Pertinent Vitals/ Pain       Pain Assessment: 0-10 Pain Score: 10-Worst pain ever Pain Location: RLE Pain Descriptors / Indicators: Aching Pain Intervention(s): Repositioned (Repositioned with a pillow which seemed to help.)  Home Living  Prior Functioning/Environment              Frequency  Min 1X/week        Progress Toward Goals  OT Goals(current goals can now be found in the care plan section)  Progress towards OT goals: Progressing toward goals  Acute Rehab OT Goals Patient Stated Goal: To return home OT Goal Formulation: With patient Potential to Achieve Goals: Good  Plan      Co-evaluation                 AM-PAC PT "6 Clicks" Daily Activity     Outcome Measure   Help from another person eating meals?: None Help from another person taking care of personal grooming?: None Help from another person toileting, which includes using toliet, bedpan, or urinal?: A Little Help from another person bathing (including washing, rinsing, drying)?: A  Little Help from another person to put on and taking off regular upper body clothing?: A Little Help from another person to put on and taking off regular lower body clothing?: A Little 6 Click Score: 20    End of Session    OT Visit Diagnosis: Muscle weakness (generalized) (M62.81)   Activity Tolerance Patient tolerated treatment well   Patient Left in chair;with call bell/phone within reach;with chair alarm set   Nurse Communication          Time: 1006-1030 OT Time Calculation (min): 24 min  Charges: OT General Charges $OT Visit: 1 Procedure OT Treatments $Self Care/Home Management : 23-37 mins  Harrel Carina, MS, OTR/L    Harrel Carina, MS< OTR/L 01/27/2017, 10:44 AM

## 2017-01-27 NOTE — Progress Notes (Signed)
Inpatient Diabetes Program Recommendations  AACE/ADA: New Consensus Statement on Inpatient Glycemic Control (2015)  Target Ranges:  Prepandial:   less than 140 mg/dL      Peak postprandial:   less than 180 mg/dL (1-2 hours)      Critically ill patients:  140 - 180 mg/dL   Lab Results  Component Value Date   GLUCAP 106 (H) 01/27/2017   HGBA1C 6.4 (H) 05/12/2016   Review of Glycemic Control  Diabetes history: DM 2 Outpatient Diabetes medications: None Current orders for Inpatient glycemic control: Novolog Sensitive 0-9 units tid + Novolog HS scale 0-5 units  Consult received, Glucose trends being monitored. Agree with current inpatient regimen.  Thanks,  Tama Headings RN, MSN, [facility] Inpatient Diabetes Coordinator Team Pager [phone] (8a-5p)

## 2017-01-27 NOTE — Progress Notes (Signed)
[facility] [place]: Barbara [name]    MR#:  [id number]  DATE OF BIRTH:  [birth year]  SUBJECTIVE:  CHIEF COMPLAINT:   Chief Complaint  Patient presents with  . Abdominal Pain  c/o pain in rt knee REVIEW OF SYSTEMS:    Review of Systems  Constitutional: Positive for fever and malaise/fatigue. Negative for chills.  HENT: Negative for sore throat.   Eyes: Negative for blurred vision, double vision and pain.  Respiratory: Negative for cough, hemoptysis, shortness of breath and wheezing.   Cardiovascular: Negative for chest pain, palpitations, orthopnea and leg swelling.  Gastrointestinal: Positive for nausea. Negative for abdominal pain, constipation, diarrhea, heartburn and vomiting.  Genitourinary: Negative for dysuria and hematuria.  Musculoskeletal: Positive for joint pain. Negative for back pain.  Skin: Negative for rash.  Neurological: Positive for weakness. Negative for sensory change, speech change, focal weakness and headaches.  Endo/Heme/Allergies: Does not bruise/bleed easily.  Psychiatric/Behavioral: Negative for depression. The patient is not nervous/anxious.    DRUG ALLERGIES:   Allergies  Allergen Reactions  . Glipizide Er     Dropped blood sugar too much  . Percocet [Oxycodone-Acetaminophen] Hives   VITALS:  Blood pressure 107/60, pulse 65, temperature 98.1 F (36.7 C), temperature source Oral, resp. rate 20, height 5' 8"  (1.727 m), weight 71.9 kg (158 lb 8 oz), SpO2 95 %. PHYSICAL EXAMINATION:   Physical Exam  GENERAL:  76 y.o.-year-old patient lying in the bed with no acute distress.  EYES: Pupils equal, round, reactive to light and accommodation. No scleral icterus. Extraocular muscles intact.  HEENT: Head atraumatic, normocephalic. Oropharynx and nasopharynx clear.  NECK:  Supple, no jugular venous distention. No thyroid enlargement, no tenderness.  LUNGS: Normal breath sounds bilaterally, no wheezing, rales,  rhonchi. No use of accessory muscles of respiration. HD Access + CARDIOVASCULAR: S1, S2 normal. No murmurs, rubs, or gallops.  ABDOMEN: Soft, nontender, nondistended. Bowel sounds present. No organomegaly or mass.  EXTREMITIES: No cyanosis, clubbing or edema b/l.    NEUROLOGIC: Cranial nerves II through XII are intact. No focal Motor or sensory deficits b/l.   PSYCHIATRIC: The patient is alert and oriented x 3.  SKIN: No obvious rash, lesion, or ulcer.  LABORATORY PANEL:   CBC  Recent Labs Lab 01/27/17 0543  WBC 4.2  HGB 9.7*  HCT 29.8*  PLT 140*   ------------------------------------------------------------------------------------------------------------------ Chemistries   Recent Labs Lab 01/24/17 1738  01/27/17 0543  NA 132*  < > 137  K 4.8  < > 4.3  CL 102  < > 102  CO2 13*  < > 25  GLUCOSE 181*  < > 93  BUN 89*  < > 39*  CREATININE 9.62*  < > 5.60*  CALCIUM 8.8*  < > 8.6*  AST 24  --   --   ALT 17  --   --   ALKPHOS 76  --   --   BILITOT 0.6  --   --   < > = values in this interval not displayed. ------------------------------------------------------------------------------------------------------------------  Cardiac Enzymes No results for input(s): TROPONINI in the last 168 hours. ------------------------------------------------------------------------------------------------------------------  RADIOLOGY:  [place] Ue Vein Mapping Left  Result Date: 01/27/2017 CLINICAL DATA:  Left arm vein mapping study EXAM: [place] EXTREM UP VEIN MAPPING COMPARISON:  None. FINDINGS: LEFT ARTERIES Wrist Radial Artery: Size 1.40m  Waveform triphasic Wrist Ulnar Artery: Size 1.074m Waveform biphasic Prox. Forearm Radial Artery: Size 2.55m155mWaveform triphasic Upper Arm Brachial Artery:  Duplicated Size 6.8TM  Waveform triphasic LEFT VEINS Forearm Cephalic Vein: Prox very smallmm Distal very smallmm Depth mm Forearm Basilic Vein: Prox 69m Distal very smallmm Depth mm Upper Arm Cephalic Vein:  Prox very smallmm Distal very smallmm Depth mm Upper Arm Basilic Vein: Prox 31.9QQDistal 2.587mDepth 11.53m46mpper Arm Brachial Vein:  Duplicated Prox 5.42.2LNstal 4.3mm13mpth 16mm33mITIONAL LEFT VEINS Axillary Vein:  13mm 2mlavian Vein: Patient: Yes Respiratory Phasicity: Present Internal Jugular Vein: Patent: No. There is partially occlusive thrombus in the jugular vein which is nearly occlusive. Respiratory Phasicity: Present Branches > 2 mm: IMPRESSION: Arterial and venous measurements in the left upper extremity are delineated above. Partially occlusive DVT in the left jugular vein. Electronically Signed   By: ArthurMarybelle Killings  On: 01/27/2017 14:02   ASSESSMENT AND PLAN:  Barbara CoNiomie Englert76 y.o76female with a history of Anemia of chronic disease, bladder cancer, end-stage renal disease on hemodialysis Tuesday Thursday and Saturday, COPD, diabetes, hyperlipidemia, GERD, hypertension now being admitted with:  #. Sepsis: Present on admission secondary to UTI, other possible sources include port and GI tract - change to IV fortaz per ID - So far blood cultures and urine culture had been negative - ID input appreciated  #. History of end-stage renal disease on hemodialysis: somewhat hypokalemic with potassium of 5.2 - Potassium has been normalized with dialysis -Nephrology consulted for inpatient dialysis  # Incidental IJ clot: seen on vein mapping. Patient has Left IJ permcath on the same side - await vascular input  #. History of anemia of chronic kidney disease -Continue iron  #. History of urinary incontinence - bladder cancer and ureteral stents.  - Follows with Dr. BrandoErlene Quaninue Mirabegron AND oxybutynin  #. History of GERD - Continue Nexium  # Neuropathy on gabapentin  # History of sleep apnea on oxygen and CPAP at night  # History of type 2 diabetes mellitus without complication. - Monitor blood sugar  # knee pain: add ibuprofen prn   Physical therapy  Recommends home health PT   All the records are reviewed and case discussed with Care Management/Social Worker Management plans discussed with the patient, nursing, Dr. Singh Candiss Norser. FitzgeOla Spurrhey are in agreement.  CODE STATUS: FULL CODE  DVT Prophylaxis: SCDs  TOTAL TIME TAKING CARE OF THIS PATIENT: 30 minutes.   POSSIBLE D/C IN 1-2 DAYS, DEPENDING ON CLINICAL CONDITION. And vascular eval    [place] Phifer Max Sanen 01/27/2017 at 4:19 PM  Between 7am to 6pm - Pager - [phone]  After 6pm go to [email or web address] - password [email or web address]  [facility]  Office  [phone] care physician; WalkerMadelyn BrunnerNote: This dictation was prepared with Dragon dictation along with smaller phrase technology. Any transcriptional errors that result from this process are unintentional.

## 2017-01-28 LAB — GLUCOSE, CAPILLARY
GLUCOSE-CAPILLARY: 100 mg/dL — AB (ref 65–99)
GLUCOSE-CAPILLARY: 104 mg/dL — AB (ref 65–99)
GLUCOSE-CAPILLARY: 147 mg/dL — AB (ref 65–99)
Glucose-Capillary: 94 mg/dL (ref 65–99)

## 2017-01-28 LAB — HEMOGLOBIN A1C
Hgb A1c MFr Bld: 4.8 % (ref 4.8–5.6)
MEAN PLASMA GLUCOSE: 91 mg/dL

## 2017-01-28 MED ORDER — CEFTAZIDIME AND DEXTROSE 2 GM/50ML IV SOLR
2.0000 g | INTRAVENOUS | Status: DC
  Administered 2017-01-28: 2 g via INTRAVENOUS
  Filled 2017-01-28: qty 50

## 2017-01-28 MED ORDER — BISACODYL 5 MG PO TBEC
10.0000 mg | DELAYED_RELEASE_TABLET | Freq: Every day | ORAL | Status: DC
Start: 2017-01-28 — End: 2017-01-29
  Administered 2017-01-29: 10 mg via ORAL
  Filled 2017-01-28: qty 2

## 2017-01-28 MED ORDER — SENNOSIDES-DOCUSATE SODIUM 8.6-50 MG PO TABS
2.0000 | ORAL_TABLET | Freq: Two times a day (BID) | ORAL | Status: DC
  Administered 2017-01-28 – 2017-01-29 (×2): 2 via ORAL
  Filled 2017-01-28 (×2): qty 2

## 2017-01-28 MED ORDER — POLYETHYLENE GLYCOL 3350 17 G PO PACK
17.0000 g | PACK | Freq: Every day | ORAL | Status: DC
  Administered 2017-01-28: 17 g via ORAL
  Filled 2017-01-28: qty 1

## 2017-01-28 MED ORDER — IBUPROFEN 400 MG PO TABS
400.0000 mg | ORAL_TABLET | Freq: Four times a day (QID) | ORAL | Status: DC | PRN
  Administered 2017-01-28 (×2): 400 mg via ORAL
  Filled 2017-01-28 (×2): qty 1

## 2017-01-28 NOTE — Progress Notes (Signed)
Pre-hemodialysis treatment

## 2017-01-28 NOTE — Progress Notes (Signed)
[facility], [place] 01/28/17  Subjective:  Patient known to our practice from outpatient dialysis.  Presents for profound weakness, diarrhea over the weekend.  Missed her treatment on Saturday. 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 She was seen by urologist on June 27.  At that time it was noted that she continues to have urinary frequency, urgency and intermittent gross hematuria.she underwent a cystoscopy, diffusely erythematous bladder, b/l ureteral stents removed  During vein mapping an incidental IJ clot was discovered. Patient has Left IJ permcath on the same side Today, she is doing better     No acute c/o Awaiting dialysis today  Objective:  Vital signs in last 24 hours:  Temp:  [98 F (36.7 C)-98.6 F (37 C)] 98 F (36.7 C) (07/12 1409) Pulse Rate:  [63-72] 69 (07/12 1409) Resp:  [13-20] 17 (07/12 1409) BP: (105-130)/(51-69) 126/54 (07/12 1409) SpO2:  [92 %-98 %] 97 % (07/12 1409) Weight:  [73.8 kg (162 lb 11.2 oz)] 73.8 kg (162 lb 11.2 oz) (07/12 1053)  Weight change:  Filed Weights   01/26/17 0955 01/26/17 1324 01/28/17 1053  Weight: 72.1 kg (158 lb 15.2 oz) 71.9 kg (158 lb 8 oz) 73.8 kg (162 lb 11.2 oz)    Intake/Output:    Intake/Output Summary (Last 24 hours) at 01/28/17 1413 Last data filed at 01/28/17 1409  Gross per 24 hour  Intake              530 ml  Output              500 ml  Net               30 ml     Physical Exam: General: Chronically ill-appearing,    HEENT Anicteric, moist oral mucous membranes  Neck supple  Pulm/lungs Normal breathing effort, clear to auscultation  CVS/Heart irregular  Abdomen:  Soft, nontender, nondistended  Extremities: No edema  Neurologic: Alert, able to answer questions  Skin: Normal turgor  Access: Left IJ  PermCath, rt IJ chemo port       Basic Metabolic Panel:   Recent Labs Lab 01/24/17 1738 01/25/17 0332 01/25/17 1026 01/26/17 0554 01/27/17 0543  NA 132* 136  --  138 137  K 4.8 5.2* 5.4* 3.9 4.3  CL 102 108  --  105 102  CO2 13* 14*  --  24 25  GLUCOSE 181* 108*  --  105* 93  BUN 89* 91*  --  53* 39*  CREATININE 9.62* 9.66*  --  6.70* 5.60*  CALCIUM 8.8* 8.7*  --  8.3* 8.6*     CBC:  Recent Labs Lab 01/24/17 1738 01/25/17 0332 01/26/17 0554 01/27/17 0543  WBC 11.8* 10.3 5.4 4.2  HGB 11.0* 10.6* 10.1* 9.7*  HCT 34.4* 32.3* 30.3* 29.8*  MCV 98.3 98.9 96.8 97.5  PLT 231 213 162 140*      Lab Results  Component Value Date   HEPBSAG Negative 11/20/2016      Microbiology:  Recent Results (from the past 240 hour(s))  Culture, blood (Routine x 2)     Status: None (Preliminary result)   Collection Time: 01/24/17  5:39 PM  Result Value Ref Range Status   Specimen Description BLOOD LEFT HAND  Final   Special Requests   Final  BOTTLES DRAWN AEROBIC AND ANAEROBIC Blood Culture adequate volume   Culture NO GROWTH 4 DAYS  Final   Report Status PENDING  Incomplete  Culture, blood (Routine x 2)     Status: None (Preliminary result)   Collection Time: 01/24/17  9:29 PM  Result Value Ref Range Status   Specimen Description BLOOD RIGHT FOREARM  Final   Special Requests   Final    BOTTLES DRAWN AEROBIC AND ANAEROBIC Blood Culture adequate volume   Culture NO GROWTH 4 DAYS  Final   Report Status PENDING  Incomplete  Urine culture     Status: Abnormal   Collection Time: 01/24/17 10:10 PM  Result Value Ref Range Status   Specimen Description URINE, RANDOM  Final   Special Requests NONE  Final   Culture (A)  Final    <10,000 COLONIES/mL INSIGNIFICANT GROWTH Performed at [facility], [place] [address] [place], [place] [address]    Report Status 01/26/2017 FINAL  Final  MRSA PCR Screening     Status: None   Collection Time: 01/27/17  5:25 AM  Result Value  Ref Range Status   MRSA by PCR NEGATIVE NEGATIVE Final    Comment:        The GeneXpert MRSA Assay (FDA approved for NASAL specimens only), is one component of a comprehensive MRSA colonization surveillance program. It is not intended to diagnose MRSA infection nor to guide or monitor treatment for MRSA infections.     Coagulation Studies: No results for input(s): LABPROT, INR in the last 72 hours.  Urinalysis: No results for input(s): COLORURINE, LABSPEC, PHURINE, GLUCOSEU, HGBUR, BILIRUBINUR, KETONESUR, PROTEINUR, UROBILINOGEN, NITRITE, LEUKOCYTESUR in the last 72 hours.  Invalid input(s): APPERANCEUR    Imaging: [place] Ue Vein Mapping Left  Result Date: 01/27/2017 CLINICAL DATA:  Left arm vein mapping study EXAM: [place] EXTREM UP VEIN MAPPING COMPARISON:  None. FINDINGS: LEFT ARTERIES Wrist Radial Artery: Size 1.62m  Waveform triphasic Wrist Ulnar Artery: Size 1.0832m Waveform biphasic Prox. Forearm Radial Artery: Size 2.32m48mWaveform triphasic Upper Arm Brachial Artery:  Duplicated Size 3.34.1ULaveform triphasic LEFT VEINS Forearm Cephalic Vein: Prox very smallmm Distal very smallmm Depth mm Forearm Basilic Vein: Prox 5mm520mstal very smallmm Depth mm Upper Arm Cephalic Vein: Prox very smallmm Distal very smallmm Depth mm Upper Arm Basilic Vein: Prox 3.20m8.4TXtal 2.5mm 69mth 11.4mm U69mr Arm Brachial Vein:  Duplicated Prox 5.4mm 6.4WOl 4.32mm De22m 120mm AD49mONAL LEFT VEINS Axillary Vein:  132mm Sub22mian Vein: Patient: Yes Respiratory Phasicity: Present Internal Jugular Vein: Patent: No. There is partially occlusive thrombus in the jugular vein which is nearly occlusive. Respiratory Phasicity: Present Branches > 2 mm: IMPRESSION: Arterial and venous measurements in the left upper extremity are delineated above. Partially occlusive DVT in the left jugular vein. Electronically Signed   By: Arthur  HMarybelle Killingsn: 01/27/2017 14:02     Medications:   . cefTAZidime-D5W     . bisacodyl  10 mg  Oral Daily  . cholecalciferol  1,000 Units Oral Daily  . ferrous sulfate  325 mg Oral Daily  . gabapentin  600 mg Oral TID  . heparin  5,000 Units Subcutaneous Q8H  . insulin aspart  0-5 Units Subcutaneous QHS  . insulin aspart  0-9 Units Subcutaneous TID WC  . latanoprost  1 drop Both Eyes QHS  . mirabegron ER  25 mg Oral Daily  . oxybutynin  5 mg Oral TID  . pantoprazole  40 mg Oral Daily  .  polyethylene glycol  17 g Oral Daily  . senna-docusate  2 tablet Oral BID   albuterol, ibuprofen, ipratropium, lidocaine, ondansetron **OR** ondansetron (ZOFRAN) IV  Assessment/ Plan:  76 y.o. 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  End-stage renal disease, [place]; TTS/  [facility] 2.  Hyperkalemia 3.  diarrhea 4.  Hematuria- H/o Bladder cancer with recent stent removal and cystoscopy(12/2016) showing grossly erythematous bladder from radiation changes 5.  Anemia of chronic kidney disease 6.  Secondary hyperparathyroidism 7. Rt IJ thrombus- incidental finding  Plan: Hyperkalemia has improved Routine hemodialysis today afternoon Blood cultures and urine cultures are negative EPO with HD   Discussed with vascular surgery, IJ clot is probably catheter related. She is at high risk of bleeding with anticoagulation Will continue to proceed with evaluation for access placement      LOS: 4 [facility] 7/12/20182:13 PM  [place], [place]

## 2017-01-28 NOTE — Progress Notes (Signed)
Post hemodialysis

## 2017-01-28 NOTE — Progress Notes (Signed)
PT Cancellation Note  Patient Details Name: Barbara [name] MRN: [id number] DOB: [birth year]   Cancelled Treatment:    Reason Eval/Treat Not Completed: Patient at procedure or test/unavailable. Pt unavailable for treatment due to hemodialysis. Re attempt at a later time/date, as the schedule and pt availability allows.    Larae Grooms, PTA 01/28/2017, 11:53 AM

## 2017-01-28 NOTE — Progress Notes (Signed)
Hemodialysis completed.

## 2017-01-28 NOTE — Progress Notes (Signed)
Pre-Hemodialysis treatment.

## 2017-01-28 NOTE — Progress Notes (Signed)
OT Cancellation Note  Patient Details Name: Barbara [name] MRN: [id number] DOB: 11-11-1940   Cancelled Treatment:    Reason Eval/Treat Not Completed: Patient at procedure or test/ unavailable. Pt unavailable for OT treatment due to hemodialysis. Will plan to re-attempt at a later time/date, as the schedule and pt availability allows.   Jeni Salles, MPH, MS, OTR/L ascom [phone] 01/28/17, 12:27 PM

## 2017-01-28 NOTE — Progress Notes (Signed)
Post Hemodialysis

## 2017-01-28 NOTE — Progress Notes (Signed)
Physical Therapy Treatment Patient Details Name: Barbara [name] MRN: [id number] DOB: March 04, 1941 Today's Date: 01/28/2017    History of Present Illness Barbara [name] is a 76 y.o. female with a known history of anemia of chronic disease, bladder cancer, end-stage renal disease on hemodialysis Tuesday Thursday and Saturday, COPD, diabetes, hyperlipidemia, GERD, hypertension presents to the emergency department for evaluation of generalized weakness.  Patient was in a usual state of health until the past 2-3 days when she describes feeling generally unwell, lethargic and globally weak. At baseline, she is functionally independent but for the past 2 days she has required assistance with activities of daily living and has been using her rollator for ambulation. She is now admitted for sepsis secondary to UTI and is currently on enteric precautions    PT Comments    Pt has just recently returned from dialysis and is eating lunch. Pt notes continued right knee pain, which is limiting her mobility. Pt does not wish out of bed, but is agreeable to a few exercises. Pt participated in short supine exercise session with written instructions provided. Pt encouraged to perform exercises 3-4 times per day as able. Continue PT to progress strength, endurance to improve all functional mobility.    Follow Up Recommendations  Home health PT     Equipment Recommendations  None recommended by PT;Other (comment)    Recommendations for Other Services       Precautions / Restrictions Precautions Precautions: Fall;Other (comment) Restrictions Weight Bearing Restrictions: No    Mobility  Bed Mobility               General bed mobility comments: Not tested; pt refuses out of bed due to knee pain and currently eating late lunch  Transfers                    Ambulation/Gait                 Stairs            Wheelchair Mobility    Modified Rankin (Stroke Patients Only)        Balance                                            Cognition Arousal/Alertness: Awake/alert Behavior During Therapy: WFL for tasks assessed/performed Overall Cognitive Status: Within Functional Limits for tasks assessed                                 General Comments: Recently returned from dialysis      Exercises General Exercises - Lower Extremity Ankle Circles/Pumps: AROM;Both;10 reps;Supine Quad Sets: Strengthening;Both;10 reps;Supine Gluteal Sets: Strengthening;Both;10 reps;Supine Short Arc Quad: Other (comment) (reviewed verbally; pt did not wish to perform currently) Heel Slides: AAROM;Right;10 reps;Supine (L AROM) Hip ABduction/ADduction: AAROM;10 reps;Supine;Both Straight Leg Raises: AAROM;Both;5 reps;Supine Other Exercises Other Exercises: Pt given written exercise program    General Comments        Pertinent Vitals/Pain Pain Assessment: Faces Faces Pain Scale: Hurts little more Pain Location: R knee Pain Intervention(s): Limited activity within patient's tolerance    Home Living                      Prior Function  PT Goals (current goals can now be found in the care plan section) Progress towards PT goals: Not progressing toward goals - comment    Frequency    Min 2X/week      PT Plan Current plan remains appropriate    Co-evaluation              AM-PAC PT "6 Clicks" Daily Activity  Outcome Measure  Difficulty turning over in bed (including adjusting bedclothes, sheets and blankets)?: Total Difficulty moving from lying on back to sitting on the side of the bed? : Total Difficulty sitting down on and standing up from a chair with arms (e.g., wheelchair, bedside commode, etc,.)?: Total Help needed moving to and from a bed to chair (including a wheelchair)?: A Little Help needed walking in hospital room?: A Little Help needed climbing 3-5 steps with a railing? : A Lot 6 Click Score:  11    End of Session   Activity Tolerance: Patient limited by fatigue;Patient limited by pain Patient left: in bed;with call bell/phone within reach;with bed alarm set   PT Visit Diagnosis: Unsteadiness on feet (R26.81);Muscle weakness (generalized) (M62.81)     Time: 0034-9179 PT Time Calculation (min) (ACUTE ONLY): 16 min  Charges:  $Therapeutic Exercise: 8-22 mins                    G Codes:        Larae Grooms, PTA 01/28/2017, 3:52 PM

## 2017-01-28 NOTE — Progress Notes (Signed)
[facility] [place]: Barbara [name]    MR#:  [id number]  DATE OF BIRTH:  1941/03/22  SUBJECTIVE:  CHIEF COMPLAINT:   Chief Complaint  Patient presents with  . Abdominal Pain  Complaints of constipation REVIEW OF SYSTEMS:    Review of Systems  Constitutional: Positive for malaise/fatigue. Negative for chills and fever.  HENT: Negative for sore throat.   Eyes: Negative for blurred vision, double vision and pain.  Respiratory: Negative for cough, hemoptysis, shortness of breath and wheezing.   Cardiovascular: Negative for chest pain, palpitations, orthopnea and leg swelling.  Gastrointestinal: Positive for constipation and nausea. Negative for abdominal pain, diarrhea, heartburn and vomiting.  Genitourinary: Negative for dysuria and hematuria.  Musculoskeletal: Positive for joint pain. Negative for back pain.  Skin: Negative for rash.  Neurological: Positive for weakness. Negative for sensory change, speech change, focal weakness and headaches.  Endo/Heme/Allergies: Does not bruise/bleed easily.  Psychiatric/Behavioral: Negative for depression. The patient is not nervous/anxious.    DRUG ALLERGIES:   Allergies  Allergen Reactions  . Glipizide Er     Dropped blood sugar too much  . Percocet [Oxycodone-Acetaminophen] Hives   VITALS:  Blood pressure 105/60, pulse 64, temperature 98.3 F (36.8 C), temperature source Oral, resp. rate 13, height 5' 8"  (1.727 m), weight 73.8 kg (162 lb 11.2 oz), SpO2 96 %. PHYSICAL EXAMINATION:   Physical Exam  GENERAL:  76 y.o.-year-old patient lying in the bed with no acute distress.  EYES: Pupils equal, round, reactive to light and accommodation. No scleral icterus. Extraocular muscles intact.  HEENT: Head atraumatic, normocephalic. Oropharynx and nasopharynx clear.  NECK:  Supple, no jugular venous distention. No thyroid enlargement, no tenderness.  LUNGS: Normal breath sounds bilaterally, no  wheezing, rales, rhonchi. No use of accessory muscles of respiration. HD Access + CARDIOVASCULAR: S1, S2 normal. No murmurs, rubs, or gallops.  ABDOMEN: Soft, nontender, nondistended. Bowel sounds present. No organomegaly or mass.  EXTREMITIES: No cyanosis, clubbing or edema b/l.    NEUROLOGIC: Cranial nerves II through XII are intact. No focal Motor or sensory deficits b/l.   PSYCHIATRIC: The patient is alert and oriented x 3.  SKIN: No obvious rash, lesion, or ulcer.  LABORATORY PANEL:   CBC  Recent Labs Lab 01/27/17 0543  WBC 4.2  HGB 9.7*  HCT 29.8*  PLT 140*   ------------------------------------------------------------------------------------------------------------------ Chemistries   Recent Labs Lab 01/24/17 1738  01/27/17 0543  NA 132*  < > 137  K 4.8  < > 4.3  CL 102  < > 102  CO2 13*  < > 25  GLUCOSE 181*  < > 93  BUN 89*  < > 39*  CREATININE 9.62*  < > 5.60*  CALCIUM 8.8*  < > 8.6*  AST 24  --   --   ALT 17  --   --   ALKPHOS 76  --   --   BILITOT 0.6  --   --   < > = values in this interval not displayed. ------------------------------------------------------------------------------------------------------------------  Cardiac Enzymes No results for input(s): TROPONINI in the last 168 hours. ------------------------------------------------------------------------------------------------------------------  RADIOLOGY:  [place] Ue Vein Mapping Left  Result Date: 01/27/2017 CLINICAL DATA:  Left arm vein mapping study EXAM: [place] EXTREM UP VEIN MAPPING COMPARISON:  None. FINDINGS: LEFT ARTERIES Wrist Radial Artery: Size 1.11m  Waveform triphasic Wrist Ulnar Artery: Size 1.074m Waveform biphasic Prox. Forearm Radial Artery: Size 2.43m10mWaveform triphasic Upper Arm Brachial Artery:  Duplicated Size 5.8IT  Waveform triphasic LEFT VEINS Forearm Cephalic Vein: Prox very smallmm Distal very smallmm Depth mm Forearm Basilic Vein: Prox 75m Distal very smallmm Depth mm Upper  Arm Cephalic Vein: Prox very smallmm Distal very smallmm Depth mm Upper Arm Basilic Vein: Prox 32.5QDDistal 2.5555mDepth 11.55m57mpper Arm Brachial Vein:  Duplicated Prox 5.48.2MEstal 4.3mm85mpth 16mm92mITIONAL LEFT VEINS Axillary Vein:  13mm 155mlavian Vein: Patient: Yes Respiratory Phasicity: Present Internal Jugular Vein: Patent: No. There is partially occlusive thrombus in the jugular vein which is nearly occlusive. Respiratory Phasicity: Present Branches > 2 mm: IMPRESSION: Arterial and venous measurements in the left upper extremity are delineated above. Partially occlusive DVT in the left jugular vein. Electronically Signed   By: ArthurMarybelle Killings  On: 01/27/2017 14:02   ASSESSMENT AND PLAN:  Barbara CoAracelis Ulrey76 y.o83female with a history of Anemia of chronic disease, bladder cancer, end-stage renal disease on hemodialysis Tuesday Thursday and Saturday, COPD, diabetes, hyperlipidemia, GERD, hypertension now being admitted with:  #. Sepsis: Present on admission secondary to UTI, other possible sources include port and GI tract - change to IV fortaz per ID - So far blood cultures and urine culture had been negative - ID input appreciated  #. History of end-stage renal disease on hemodialysis: somewhat hypokalemic with potassium of 5.2 - Potassium has been normalized with dialysis -Nephrology consulted for inpatient dialysis  # Incidental IJ clot: seen on vein mapping. Patient has Left IJ permcath on the same side - await vascular input  #. History of anemia of chronic kidney disease -Continue iron  #. History of urinary incontinence - bladder cancer and ureteral stents.  - Follows with Dr. BrandoErlene Quaninue Mirabegron AND oxybutynin  #. History of GERD - Continue Nexium  # Neuropathy on gabapentin  # History of sleep apnea on oxygen and CPAP at night  # History of type 2 diabetes mellitus without complication. - Monitor blood sugar  # knee pain:  ibuprofen  prn  #Constipation -Add Senokot 2 tablets twice a day, MiraLAX once daily, Dulcolax once daily   Physical therapy Recommends STR/SNF -and is refusing and would like to go home with home health   All the records are reviewed and case discussed with Care Management/Social Worker Management plans discussed with the patient, nursing, Dr. Singh Candiss Norser. Barbara [name] are in agreement.  CODE STATUS: FULL CODE  DVT Prophylaxis: SCDs  TOTAL TIME TAKING CARE OF THIS PATIENT: 30 minutes.   POSSIBLE D/C IN 1 DAYS, DEPENDING ON CLINICAL CONDITION.     Barbara [name] Max Sanen 01/28/2017 at 1:37 PM  Between 7am to 6pm - Pager - [phone]  After 6pm go to [email or web address] - password [email or web address]  [facility]  Office  [phone] care physician; WalkerMadelyn BrunnerNote: This dictation was prepared with Dragon dictation along with smaller phrase technology. Any transcriptional errors that result from this process are unintentional.

## 2017-01-28 NOTE — Progress Notes (Signed)
Hemodialysis treatment started.

## 2017-01-29 LAB — CULTURE, BLOOD (ROUTINE X 2)
Culture: NO GROWTH
Culture: NO GROWTH
SPECIAL REQUESTS: ADEQUATE
SPECIAL REQUESTS: ADEQUATE

## 2017-01-29 LAB — GLUCOSE, CAPILLARY: GLUCOSE-CAPILLARY: 91 mg/dL (ref 65–99)

## 2017-01-29 MED ORDER — CEFTAZIDIME AND DEXTROSE 2 GM/50ML IV SOLR: 2.0000 g | INTRAVENOUS | Status: AC

## 2017-01-29 NOTE — Progress Notes (Signed)
[facility], [place] 01/29/17  Subjective:  Patient known to our practice from outpatient dialysis.  Presents for profound weakness, diarrhea over the weekend.  Missed her treatment on Saturday. 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 She was seen by urologist on June 27.  At that time it was noted that she continues to have urinary frequency, urgency and intermittent gross hematuria.she underwent a cystoscopy, diffusely erythematous bladder, b/l ureteral stents removed  During vein mapping an incidental IJ clot was discovered. Patient has Left IJ permcath on the same side Today, she is doing better. sitting up in chair awaiting diacharge      Objective:  Vital signs in last 24 hours:  Temp:  [97.5 F (36.4 C)-98.3 F (36.8 C)] 98.3 F (36.8 C) (07/13 0500) Pulse Rate:  [70-72] 72 (07/13 0500) Resp:  [20] 20 (07/13 0500) BP: (109-118)/(55-57) 109/55 (07/13 0500) SpO2:  [95 %] 95 % (07/13 0500)  Weight change:  Filed Weights   01/26/17 0955 01/26/17 1324 01/28/17 1053  Weight: 72.1 kg (158 lb 15.2 oz) 71.9 kg (158 lb 8 oz) 73.8 kg (162 lb 11.2 oz)    Intake/Output:    Intake/Output Summary (Last 24 hours) at 01/29/17 1605 Last data filed at 01/29/17 0900  Gross per 24 hour  Intake              401 ml  Output              500 ml  Net              -99 ml     Physical Exam: General: Chronically ill-appearing,    HEENT Anicteric, moist oral mucous membranes  Neck supple  Pulm/lungs Normal breathing effort, clear to auscultation  CVS/Heart irregular  Abdomen:  Soft, nontender, nondistended  Extremities: No edema  Neurologic: Alert, able to answer questions  Skin: Normal turgor  Access: Left IJ PermCath, rt IJ chemo port       Basic Metabolic Panel:   Recent  Labs Lab 01/24/17 1738 01/25/17 0332 01/25/17 1026 01/26/17 0554 01/27/17 0543  NA 132* 136  --  138 137  K 4.8 5.2* 5.4* 3.9 4.3  CL 102 108  --  105 102  CO2 13* 14*  --  24 25  GLUCOSE 181* 108*  --  105* 93  BUN 89* 91*  --  53* 39*  CREATININE 9.62* 9.66*  --  6.70* 5.60*  CALCIUM 8.8* 8.7*  --  8.3* 8.6*     CBC:  Recent Labs Lab 01/24/17 1738 01/25/17 0332 01/26/17 0554 01/27/17 0543  WBC 11.8* 10.3 5.4 4.2  HGB 11.0* 10.6* 10.1* 9.7*  HCT 34.4* 32.3* 30.3* 29.8*  MCV 98.3 98.9 96.8 97.5  PLT 231 213 162 140*      Lab Results  Component Value Date   HEPBSAG Negative 11/20/2016      Microbiology:  Recent Results (from the past 240 hour(s))  Culture, blood (Routine x 2)     Status: None   Collection Time: 01/24/17  5:39 PM  Result Value Ref Range Status   Specimen Description BLOOD LEFT HAND  Final   Special Requests   Final    BOTTLES DRAWN AEROBIC AND ANAEROBIC Blood Culture adequate volume   Culture NO GROWTH 5 DAYS  Final  Report Status 01/29/2017 FINAL  Final  Culture, blood (Routine x 2)     Status: None   Collection Time: 01/24/17  9:29 PM  Result Value Ref Range Status   Specimen Description BLOOD RIGHT FOREARM  Final   Special Requests   Final    BOTTLES DRAWN AEROBIC AND ANAEROBIC Blood Culture adequate volume   Culture NO GROWTH 5 DAYS  Final   Report Status 01/29/2017 FINAL  Final  Urine culture     Status: Abnormal   Collection Time: 01/24/17 10:10 PM  Result Value Ref Range Status   Specimen Description URINE, RANDOM  Final   Special Requests NONE  Final   Culture (A)  Final    <10,000 COLONIES/mL INSIGNIFICANT GROWTH Performed at [facility], [place] [address], [place], [place] [address]    Report Status 01/26/2017 FINAL  Final  MRSA PCR Screening     Status: None   Collection Time: 01/27/17  5:25 AM  Result Value Ref Range Status   MRSA by PCR NEGATIVE NEGATIVE Final    Comment:        The GeneXpert MRSA Assay  (FDA approved for NASAL specimens only), is one component of a comprehensive MRSA colonization surveillance program. It is not intended to diagnose MRSA infection nor to guide or monitor treatment for MRSA infections.     Coagulation Studies: No results for input(s): LABPROT, INR in the last 72 hours.  Urinalysis: No results for input(s): COLORURINE, LABSPEC, PHURINE, GLUCOSEU, HGBUR, BILIRUBINUR, KETONESUR, PROTEINUR, UROBILINOGEN, NITRITE, LEUKOCYTESUR in the last 72 hours.  Invalid input(s): APPERANCEUR    Imaging: No results found.   Medications:   . cefTAZidime-D5W Stopped (01/28/17 1920)   . bisacodyl  10 mg Oral Daily  . cholecalciferol  1,000 Units Oral Daily  . ferrous sulfate  325 mg Oral Daily  . gabapentin  600 mg Oral TID  . heparin  5,000 Units Subcutaneous Q8H  . insulin aspart  0-5 Units Subcutaneous QHS  . insulin aspart  0-9 Units Subcutaneous TID WC  . latanoprost  1 drop Both Eyes QHS  . mirabegron ER  25 mg Oral Daily  . oxybutynin  5 mg Oral TID  . pantoprazole  40 mg Oral Daily  . polyethylene glycol  17 g Oral Daily  . senna-docusate  2 tablet Oral BID   albuterol, ibuprofen, ipratropium, lidocaine, ondansetron **OR** ondansetron (ZOFRAN) IV  Assessment/ Plan:  76 y.o. 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  End-stage renal disease, [place]; TTS/  [facility] 2.  Hyperkalemia 3.  diarrhea 4.  Hematuria- H/o Bladder cancer with recent stent removal and cystoscopy(12/2016) showing grossly erythematous bladder from radiation changes 5.  Anemia of chronic kidney disease 6.  Secondary hyperparathyroidism 7. Rt IJ thrombus- incidental finding  Plan: Hyperkalemia has  improved Blood cultures and urine cultures are negative EPO with HD   Discussed with vascular surgery, IJ clot is probably catheter related. She is at high risk of bleeding with anticoagulation Will continue to proceed with evaluation for access placement  Continue iv Fortaz at the dialysis center for 5 doses. Message relayed to the dialysis center     LOS: [place] 7/13/20184:05 PM  [place], [place]

## 2017-01-29 NOTE — Care Management Note (Signed)
Case Management Note  Patient Details  Name: KAMIRA MELLETTE MRN: [id number] Date of Birth: Jun 25, 1941   Patient to discharge home today.  Resumption of care orders have been placed for RN, PT, OT, and aide.  [place] with [facility] notified of discharge.  RNCM signing off.   Subjective/Objective:                    Action/Plan:   Expected Discharge Date:  01/29/17               Expected Discharge Plan:  [place]  In-House Referral:     Discharge planning Services  CM Consult  Post Acute Care Choice:    Choice offered to:  Patient  DME Arranged:    DME Agency:     HH Arranged:  RN, PT, OT, Nurse's Aide [place] Agency:  [facility]  Status of Service:  Completed, signed off  If discussed at [place] of Stay Meetings, dates discussed:    Additional Comments:  [name], RN 01/29/2017, 11:07 AM

## 2017-01-29 NOTE — Progress Notes (Signed)
01/29/2017  11:19 AM  Barbara [name] to be D/C'd Home per MD order.  Discussed prescriptions and follow up appointments with the patient. Prescriptions given to patient, medication list explained in detail. Pt verbalized understanding.  Allergies as of 01/29/2017      Reactions   Glipizide Er    Dropped blood sugar too much   Percocet [oxycodone-acetaminophen] Hives      Medication List    TAKE these medications   acetaminophen 500 MG tablet Commonly known as:  TYLENOL Take 500 mg by mouth every 6 (six) hours as needed for moderate pain or headache.   calcium acetate 667 MG capsule Commonly known as:  PHOSLO Take 2 capsules (1,334 mg total) by mouth 3 (three) times daily with meals.   cefTAZidime-D5W 2 GM/50ML Solr Commonly known as:  FORTAZ Inject 50 mLs (2 g total) into the vein every Tuesday, Thursday, and Saturday at 6 PM.   cholecalciferol 1000 units tablet Commonly known as:  VITAMIN D Take 1,000 Units by mouth daily.   esomeprazole 40 MG capsule Commonly known as:  NEXIUM Take 40 mg by mouth daily at 12 noon.   ferrous sulfate 325 (65 FE) MG EC tablet Take 325 mg by mouth daily.   gabapentin 300 MG capsule Commonly known as:  NEURONTIN Take 600 mg by mouth 3 (three) times daily.   lidocaine 5 % ointment Commonly known as:  XYLOCAINE Apply 1 application topically daily as needed for mild pain.   MYRBETRIQ 25 MG Tb24 tablet Generic drug:  mirabegron ER TAKE 1 TABLET (25 MG TOTAL) BY MOUTH DAILY.   NON FORMULARY Place 1 Units into the nose at bedtime. CPAP Time of use 2100-0600   oxybutynin 5 MG tablet Commonly known as:  DITROPAN Take 5 mg by mouth 3 (three) times daily.   OXYGEN Inhale 2 L into the lungs at bedtime.   PROAIR RESPICLICK 950 (90 Base) MCG/ACT Aepb Generic drug:  Albuterol Sulfate Inhale 2 puffs into the lungs 4 (four) times daily as needed (wheezing).   TRAVATAN Z 0.004 % Soln ophthalmic solution Generic drug:  Travoprost (BAK  Free) PLACE 1 DROP INTO BOTH EYES EVERY NIGHT AT BEDTIME       Vitals:   01/28/17 2010 01/29/17 0500  BP: (!) 118/57 (!) 109/55  Pulse: 70 72  Resp: 20 20  Temp: (!) 97.5 F (36.4 C) 98.3 F (36.8 C)    Skin clean, dry and intact without evidence of skin break down, no evidence of skin tears noted. IV catheter discontinued intact. Site without signs and symptoms of complications. Dressing and pressure applied. Pt denies pain at this time. No complaints noted.  An After Visit Summary was printed and given to the patient. Patient escorted via [place], and D/C home via private auto.  [name]

## 2017-01-29 NOTE — Care Management Important Message (Signed)
Important Message  Patient Details  Name: Barbara [name] MRN: [id number] Date of Birth: 11/20/1940   Medicare Important Message Given:  Yes Signed IM notice given    Katrina Stack, RN 01/29/2017, 8:25 AM

## 2017-01-30 NOTE — Discharge Summary (Signed)
[facility] NAME: Barbara [name]    MR#:  [id number]  DATE OF BIRTH:  August 18, 1940  DATE OF ADMISSION:  01/24/2017   ADMITTING PHYSICIAN: Harvie Bridge, DO  DATE OF DISCHARGE: 01/29/2017  1:04 PM  PRIMARY CARE PHYSICIAN: Barbara Brunner, MD   ADMISSION DIAGNOSIS:  Weakness [R53.1] Sepsis, due to unspecified organism ([place]) [A41.9] Fever, unspecified fever cause [R50.9] DISCHARGE DIAGNOSIS:  Active Problems:   Sepsis secondary to UTI ([place])  SECONDARY DIAGNOSIS:   Past Medical History:  Diagnosis Date  . Anemia associated with chronic renal failure 2017   blood transfusion last week 10/17  . Arthritis   . Cancer ([name]) 2017   bladder  . Chronic kidney disease   . 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  dr. Candiss Norse and dr. Holley Raring follow her  . COPD (chronic obstructive pulmonary disease) ([place])   . Diabetes mellitus without complication ([place])   . Dyspnea    with exertion  . Elevated lipids   . GERD (gastroesophageal reflux disease)   . Hematuria   . Hypertension   . Lower back pain   . Oxygen dependent    at hs  . Personal history of chemotherapy   . Sleep apnea    uses cpap  . Urinary obstruction 01/2016   HOSPITAL COURSE:  [name] a 76 y.o.femalewith a history of Anemia of chronic disease, bladder cancer, end-stage renal disease on hemodialysis Tuesday Thursday and Saturday, COPD, diabetes, hyperlipidemia, GERD, hypertension admitted with:  #. Sepsis: Present on admission secondary to UTI - changed to IV fortaz per ID - So far blood cultures and urine culture had been negative  #. History of end-stage renal disease on hemodialysis: somewhat hypokalemic with potassium of 5.2 - Potassium has been normalized with dialysis  # Incidental Rt IJ clot: seen on vein mapping: outpt vascular eval  DISCHARGE CONDITIONS:  stable CONSULTS OBTAINED:  Treatment Team:  Barbara Iba,  MD Barbara Ramsay, MD Barbara Epley, MD Barbara [name], Barbara Lory, MD DRUG ALLERGIES:   Allergies  Allergen Reactions  . Glipizide Er     Dropped blood sugar too much  . Percocet [Oxycodone-Acetaminophen] Hives   DISCHARGE MEDICATIONS:   Allergies as of 01/29/2017      Reactions   Glipizide Er    Dropped blood sugar too much   Percocet [oxycodone-acetaminophen] Hives      Medication List    TAKE these medications   acetaminophen 500 MG tablet Commonly known as:  TYLENOL Take 500 mg by mouth every 6 (six) hours as needed for moderate pain or headache.   calcium acetate 667 MG capsule Commonly known as:  PHOSLO Take 2 capsules (1,334 mg total) by mouth 3 (three) times daily with meals.   cefTAZidime-D5W 2 GM/50ML Solr Commonly known as:  FORTAZ Inject 50 mLs (2 g total) into the vein every Tuesday, Thursday, and Saturday at 6 PM.   cholecalciferol 1000 units tablet Commonly known as:  VITAMIN D Take 1,000 Units by mouth daily.   esomeprazole 40 MG capsule Commonly known as:  NEXIUM Take 40 mg by mouth daily at 12 noon.   ferrous sulfate 325 (65 FE) MG EC tablet Take 325 mg by mouth daily.   gabapentin 300 MG capsule Commonly known as:  NEURONTIN Take 600 mg by mouth 3 (three) times daily.   lidocaine 5 % ointment Commonly known as:  XYLOCAINE Apply 1 application topically  daily as needed for mild pain.   MYRBETRIQ 25 MG Tb24 tablet Generic drug:  mirabegron ER TAKE 1 TABLET (25 MG TOTAL) BY MOUTH DAILY.   NON FORMULARY Place 1 Units into the nose at bedtime. CPAP Time of use 2100-0600   oxybutynin 5 MG tablet Commonly known as:  DITROPAN Take 5 mg by mouth 3 (three) times daily.   OXYGEN Inhale 2 L into the lungs at bedtime.   PROAIR RESPICLICK 465 (90 Base) MCG/ACT Aepb Generic drug:  Albuterol Sulfate Inhale 2 puffs into the lungs 4 (four) times daily as needed (wheezing).   TRAVATAN Z 0.004 % Soln ophthalmic solution Generic drug:   Travoprost (BAK Free) PLACE 1 DROP INTO BOTH EYES EVERY NIGHT AT BEDTIME        DISCHARGE INSTRUCTIONS:   DIET:  Regular diet DISCHARGE CONDITION:  Good ACTIVITY:  Activity as tolerated OXYGEN:  Home Oxygen: No.  Oxygen Delivery: room air DISCHARGE LOCATION:  home   If you experience worsening of your admission symptoms, develop shortness of breath, life threatening emergency, suicidal or homicidal thoughts you must seek medical attention immediately by calling 911 or calling your MD immediately  if symptoms less severe.  You Must read complete instructions/literature along with all the possible adverse reactions/side effects for all the Medicines you take and that have been prescribed to you. Take any new Medicines after you have completely understood and accpet all the possible adverse reactions/side effects.   Please note  You were cared for by a hospitalist during your hospital stay. If you have any questions about your discharge medications or the care you received while you were in the hospital after you are discharged, you can call the unit and asked to speak with the hospitalist on call if the hospitalist that took care of you is not available. Once you are discharged, your primary care physician will handle any further medical issues. Please note that NO REFILLS for any discharge medications will be authorized once you are discharged, as it is imperative that you return to your primary care physician (or establish a relationship with a primary care physician if you do not have one) for your aftercare needs so that they can reassess your need for medications and monitor your lab values.    On the day of Discharge:  VITAL SIGNS:  Blood pressure (!) 109/55, pulse 72, temperature 98.3 F (36.8 C), temperature source Oral, resp. rate 20, height 5' 8"  (1.727 m), weight 73.8 kg (162 lb 11.2 oz), SpO2 95 %. PHYSICAL EXAMINATION:  GENERAL:  76 y.o.-year-old patient lying in the  bed with no acute distress.  EYES: Pupils equal, round, reactive to light and accommodation. No scleral icterus. Extraocular muscles intact.  HEENT: Head atraumatic, normocephalic. Oropharynx and nasopharynx clear.  NECK:  Supple, no jugular venous distention. No thyroid enlargement, no tenderness.  LUNGS: Normal breath sounds bilaterally, no wheezing, rales,rhonchi or crepitation. No use of accessory muscles of respiration.  CARDIOVASCULAR: S1, S2 normal. No murmurs, rubs, or gallops.  ABDOMEN: Soft, non-tender, non-distended. Bowel sounds present. No organomegaly or mass.  EXTREMITIES: No pedal edema, cyanosis, or clubbing.  NEUROLOGIC: Cranial nerves II through XII are intact. Muscle strength 5/5 in all extremities. Sensation intact. Gait not checked.  PSYCHIATRIC: The patient is alert and oriented x 3.  SKIN: No obvious rash, lesion, or ulcer.  DATA REVIEW:   CBC  Recent Labs Lab 01/27/17 0543  WBC 4.2  HGB 9.7*  HCT 29.8*  PLT 140*  Chemistries   Recent Labs Lab 01/24/17 1738  01/27/17 0543  NA 132*  < > 137  K 4.8  < > 4.3  CL 102  < > 102  CO2 13*  < > 25  GLUCOSE 181*  < > 93  BUN 89*  < > 39*  CREATININE 9.62*  < > 5.60*  CALCIUM 8.8*  < > 8.6*  AST 24  --   --   ALT 17  --   --   ALKPHOS 76  --   --   BILITOT 0.6  --   --   < > = values in this interval not displayed.   Follow-up Information    Barbara Epley, MD. Go on 02/03/2017.   Specialty:  General Surgery Why:  Wednesday at 1:45pm to discuss port removal. Contact information: [address] [place] [address] [phone]        Barbara Brunner, MD. Go on 02/05/2017.   Specialty:  Internal Medicine Why:  Friday at 9:30am for hospital follow-up Contact information: [address] [place] [address] [phone]        Barbara Ramsay, MD. Schedule an appointment as soon as possible for a visit in 2 weeks.   Specialty:   Infectious Diseases Why:  Dr. Ola Spurr nurse will call you with your hospital follow-up Contact information: [place] [address] [phone]           Management plans discussed with the patient, family and they are in agreement.  CODE STATUS: Prior   TOTAL TIME TAKING CARE OF THIS PATIENT: 45 minutes.    Max Sane M.D on 01/30/2017 at 4:17 PM  Between 7am to 6pm - Pager - [phone]  After 6pm go to [email or web address] - Proofreader  [facility]  Office  [phone]  CC: Primary care physician; Barbara Brunner, MD   Note: This dictation was prepared with Dragon dictation along with smaller phrase technology. Any transcriptional errors that result from this process are unintentional.

## 2017-02-01 ENCOUNTER — Encounter (INDEPENDENT_AMBULATORY_CARE_PROVIDER_SITE_OTHER): Payer: Self-pay

## 2017-02-01 ENCOUNTER — Encounter (INDEPENDENT_AMBULATORY_CARE_PROVIDER_SITE_OTHER): Payer: Self-pay | Admitting: Vascular Surgery

## 2017-02-01 ENCOUNTER — Ambulatory Visit (INDEPENDENT_AMBULATORY_CARE_PROVIDER_SITE_OTHER): Payer: Medicare Other | Admitting: Vascular Surgery

## 2017-02-01 ENCOUNTER — Other Ambulatory Visit (INDEPENDENT_AMBULATORY_CARE_PROVIDER_SITE_OTHER): Payer: Self-pay | Admitting: Vascular Surgery

## 2017-02-01 ENCOUNTER — Other Ambulatory Visit (INDEPENDENT_AMBULATORY_CARE_PROVIDER_SITE_OTHER): Payer: Medicare Other

## 2017-02-01 VITALS — BP 106/60 | HR 64 | Ht 68.0 in | Wt 155.0 lb

## 2017-02-01 DIAGNOSIS — N186 End stage renal disease: Secondary | ICD-10-CM

## 2017-02-01 DIAGNOSIS — I10 Essential (primary) hypertension: Secondary | ICD-10-CM | POA: Diagnosis not present

## 2017-02-01 DIAGNOSIS — J449 Chronic obstructive pulmonary disease, unspecified: Secondary | ICD-10-CM | POA: Diagnosis not present

## 2017-02-01 DIAGNOSIS — I2510 Atherosclerotic heart disease of native coronary artery without angina pectoris: Secondary | ICD-10-CM | POA: Diagnosis not present

## 2017-02-01 DIAGNOSIS — Z794 Long term (current) use of insulin: Secondary | ICD-10-CM

## 2017-02-01 DIAGNOSIS — N184 Chronic kidney disease, stage 4 (severe): Secondary | ICD-10-CM

## 2017-02-01 DIAGNOSIS — E1122 Type 2 diabetes mellitus with diabetic chronic kidney disease: Secondary | ICD-10-CM | POA: Diagnosis not present

## 2017-02-01 NOTE — Progress Notes (Signed)
MRN : [id number]  Barbara [name] is a 76 y.o. (1940-09-04) female who presents with chief complaint of need dialysis access.  History of Present Illness:  The patient is seen for evaluation of dialysis access.  The patient has recently started on dialysis   Current access is via a catheter which is functioning poorly.  There have not been any episodes of catheter infection.  The patient denies amaurosis fugax or recent TIA symptoms. There are no recent neurological changes noted. The patient denies claudication symptoms or rest pain symptoms. The patient denies history of DVT, PE or superficial thrombophlebitis. The patient denies recent episodes of angina or shortness of breath.    No outpatient prescriptions have been marked as taking for the 02/01/17 encounter (Appointment) with Delana Meyer, Dolores Lory, MD.    Past Medical History:  Diagnosis Date  . Anemia associated with chronic renal failure 2017   blood transfusion last week 10/17  . Arthritis   . Cancer ([place]) 2017   bladder  . Chronic kidney disease   . 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  dr. Candiss Norse and dr. Holley Raring follow her  . COPD (chronic obstructive pulmonary disease) ([place])   . Diabetes mellitus without complication ([place])   . Dyspnea    with exertion  . Elevated lipids   . GERD (gastroesophageal reflux disease)   . Hematuria   . Hypertension   . Lower back pain   . Oxygen dependent    at hs  . Personal history of chemotherapy   . Sleep apnea    uses cpap  . Urinary obstruction 01/2016    Past Surgical History:  Procedure Laterality Date  . ABDOMINAL HYSTERECTOMY    . CYSTOSCOPY W/ RETROGRADES Bilateral 02/17/2016   Procedure: CYSTOSCOPY WITH RETROGRADE PYELOGRAM;  Surgeon: Hollice Espy, MD;  Location: [facility];  Service: Urology;  Laterality: Bilateral;  . CYSTOSCOPY W/ RETROGRADES Bilateral 10/12/2016   Procedure: CYSTOSCOPY WITH RETROGRADE PYELOGRAM;  Surgeon: Hollice Espy, MD;   Location: [facility];  Service: Urology;  Laterality: Bilateral;  . CYSTOSCOPY W/ URETERAL STENT PLACEMENT Left 05/12/2016   Procedure: CYSTOSCOPY WITH STENT REPLACEMENT;  Surgeon: Hollice Espy, MD;  Location: [facility];  Service: Urology;  Laterality: Left;  . CYSTOSCOPY W/ URETERAL STENT PLACEMENT Left 10/12/2016   Procedure: CYSTOSCOPY WITH STENT REPLACEMENT;  Surgeon: Hollice Espy, MD;  Location: [facility];  Service: Urology;  Laterality: Left;  . CYSTOSCOPY WITH STENT PLACEMENT Left 01/21/2016   Procedure: CYSTOSCOPY WITH double J STENT PLACEMENT;  Surgeon: Franchot Gallo, MD;  Location: [facility];  Service: Urology;  Laterality: Left;  . CYSTOSCOPY WITH STENT PLACEMENT Right 10/12/2016   Procedure: CYSTOSCOPY WITH STENT PLACEMENT;  Surgeon: Hollice Espy, MD;  Location: [facility];  Service: Urology;  Laterality: Right;  . DIALYSIS/PERMA CATHETER INSERTION N/A 11/20/2016   Procedure: Dialysis/Perma Catheter Insertion;  Surgeon: Algernon Huxley, MD;  Location: [facility];  Service: Cardiovascular;  Laterality: N/A;  . KIDNEY SURGERY  01/21/2016   IR NEPHROSTOMY PLACEMENT LEFT   . PERIPHERAL VASCULAR CATHETERIZATION N/A 04/07/2016   Procedure: Glori Luis Cath Insertion;  Surgeon: Katha Cabal, MD;  Location: [facility];  Service: Cardiovascular;  Laterality: N/A;  . ROTATOR CUFF REPAIR     both sides  . TRANSURETHRAL RESECTION OF BLADDER TUMOR N/A 02/17/2016   Procedure: TRANSURETHRAL RESECTION OF BLADDER TUMOR (TURBT)-LARGE;  Surgeon: Hollice Espy, MD;  Location: [facility];  Service: Urology;  Laterality: N/A;  .  URETEROSCOPY Left 02/17/2016   Procedure: URETEROSCOPY;  Surgeon: Hollice Espy, MD;  Location: [facility];  Service: Urology;  Laterality: Left;  . URETEROSCOPY Right 10/12/2016   Procedure: URETEROSCOPY;  Surgeon: Hollice Espy, MD;  Location: [facility];  Service: Urology;  Laterality: Right;    Social History Social History  Substance Use Topics  . Smoking status:  Former Smoker    Types: Cigarettes    Quit date: 07/20/2002  . Smokeless tobacco: Never Used     Comment: 01/22/2016   "  quit smoking many years ago "  . Alcohol use No    Family History Family History  Problem Relation Age of Onset  . Diabetes Mother   . Cancer Father   . Breast cancer Neg Hx   . Kidney disease Neg Hx   No family history of bleeding/clotting disorders, porphyria or autoimmune disease   Allergies  Allergen Reactions  . Glipizide Er     Dropped blood sugar too much  . Percocet [Oxycodone-Acetaminophen] Hives     REVIEW OF SYSTEMS (Negative unless checked)  Constitutional: [] Weight loss  [] Fever  [] Chills Cardiac: [] Chest pain   [] Chest pressure   [] Palpitations   [] Shortness of breath when laying flat   [] Shortness of breath with exertion. Vascular:  [] Pain in legs with walking   [] Pain in legs at rest  [] History of DVT   [] Phlebitis   [] Swelling in legs   [] Varicose veins   [] Non-healing ulcers Pulmonary:   [] Uses home oxygen   [] Productive cough   [] Hemoptysis   [] Wheeze  [] COPD   [] Asthma Neurologic:  [] Dizziness   [] Seizures   [] History of stroke   [] History of TIA  [] Aphasia   [] Vissual changes   [] Weakness or numbness in arm   [] Weakness or numbness in leg Musculoskeletal:   [] Joint swelling   [] Joint pain   [] Low back pain Hematologic:  [] Easy bruising  [] Easy bleeding   [] Hypercoagulable state   [] Anemic Gastrointestinal:  [] Diarrhea   [] Vomiting  [] Gastroesophageal reflux/heartburn   [] Difficulty swallowing. Genitourinary:  [x] Chronic kidney disease   [] Difficult urination  [] Frequent urination   [] Blood in urine Skin:  [] Rashes   [] Ulcers  Psychological:  [] History of anxiety   []  History of major depression.  Physical Examination  There were no vitals filed for this visit. There is no height or weight on file to calculate BMI. Gen: WD/WN, NAD Head: Gray/AT, No temporalis wasting.  Ear/Nose/Throat: Hearing grossly intact, nares w/o erythema or  drainage, poor dentition Eyes: PER, EOMI, sclera nonicteric.  Neck: Supple, no masses.  No bruit or JVD.  Pulmonary:  Good air movement, clear to auscultation bilaterally, no use of accessory muscles.  Cardiac: RRR, normal S1, S2, no Murmurs. Vascular: Left IJ catheter nontender no drainage Vessel Right Left  Radial Palpable Palpable  Ulnar Palpable Palpable  Brachial Palpable Palpable  Carotid Palpable Palpable  Gastrointestinal: soft, non-distended. No guarding/no peritoneal signs.  Musculoskeletal: M/S 5/5 throughout.  No deformity or atrophy.  Neurologic: CN 2-12 intact. Pain and light touch intact in extremities.  Symmetrical.  Speech is fluent. Motor exam as listed above. Psychiatric: Judgment intact, Mood & affect appropriate for pt's clinical situation. Dermatologic: No rashes or ulcers noted.  No changes consistent with cellulitis. Lymph : No Cervical lymphadenopathy, no lichenification or skin changes of chronic lymphedema.  CBC Lab Results  Component Value Date   WBC 4.2 01/27/2017   HGB 9.7 (L) 01/27/2017   HCT 29.8 (L) 01/27/2017   MCV 97.5 01/27/2017  PLT 140 (L) 01/27/2017    BMET    Component Value Date/Time   NA 137 01/27/2017 0543   NA 138 11/04/2016 1453   NA 138 08/07/2012 0433   K 4.3 01/27/2017 0543   K 3.2 (L) 10/30/2014 1410   CL 102 01/27/2017 0543   CL 104 08/07/2012 0433   CO2 25 01/27/2017 0543   CO2 25 08/07/2012 0433   GLUCOSE 93 01/27/2017 0543   GLUCOSE 172 (H) 08/07/2012 0433   BUN 39 (H) 01/27/2017 0543   BUN 69 (H) 11/04/2016 1453   BUN 63 (H) 08/07/2012 0433   CREATININE 5.60 (H) 01/27/2017 0543   CREATININE 2.48 (H) 08/07/2012 0433   CALCIUM 8.6 (L) 01/27/2017 0543   CALCIUM 9.1 08/07/2012 0433   GFRNONAA 7 (L) 01/27/2017 0543   GFRNONAA 19 (L) 08/07/2012 0433   GFRAA 8 (L) 01/27/2017 0543   GFRAA 22 (L) 08/07/2012 0433   Estimated Creatinine Clearance: 8.5 mL/min (A) (by C-G formula based on SCr of 5.6 mg/dL  (H)).  COAG Lab Results  Component Value Date   INR 1.23 01/25/2016   INR 1.38 01/24/2016   INR 1.38 01/23/2016    Radiology Dg Chest 2 View  Result Date: 01/24/2017 CLINICAL DATA:  Fever. Possible infection. COPD and sleep apnea. Bladder cancer. EXAM: CHEST  2 VIEW COMPARISON:  11/19/2016. FINDINGS: Hyperinflation. Right-sided Port-A-Cath unchanged in position with tip at low SVC. Left-sided dialysis catheter new since prior radiograph with tip at low SVC. Midline trachea. Mild cardiomegaly with a tortuous thoracic aorta. Possible trace bilateral pleural fluid or thickening. Bibasilar volume loss with probable atelectasis, similar. IMPRESSION: Given differences in technique, no significant change since 11/19/2016. Bibasilar volume loss with probable subsegmental atelectasis. Possible trace bilateral pleural fluid or thickening. Cardiomegaly without congestive failure. Electronically Signed   By: Abigail Miyamoto M.D.   On: 01/24/2017 18:21   [place] Ue Vein Mapping Left  Result Date: 01/27/2017 CLINICAL DATA:  Left arm vein mapping study EXAM: [place] EXTREM UP VEIN MAPPING COMPARISON:  None. FINDINGS: LEFT ARTERIES Wrist Radial Artery: Size 1.452m  Waveform triphasic Wrist Ulnar Artery: Size 1.074m Waveform biphasic Prox. Forearm Radial Artery: Size 2.77m752mWaveform triphasic Upper Arm Brachial Artery:  Duplicated Size 3.31.8HUaveform triphasic LEFT VEINS Forearm Cephalic Vein: Prox very smallmm Distal very smallmm Depth mm Forearm Basilic Vein: Prox 5mm47mstal very smallmm Depth mm Upper Arm Cephalic Vein: Prox very smallmm Distal very smallmm Depth mm Upper Arm Basilic Vein: Prox 3.52m3.1SHtal 2.5mm 57mth 11.4mm U80mr Arm Brachial Vein:  Duplicated Prox 5.4mm 7.0YOl 4.77mm De77m 152mm AD15mONAL LEFT VEINS Axillary Vein:  177mm Sub41mian Vein: Patient: Yes Respiratory Phasicity: Present Internal Jugular Vein: Patent: No. There is partially occlusive thrombus in the jugular vein which is nearly occlusive.  Respiratory Phasicity: Present Branches > 2 mm: IMPRESSION: Arterial and venous measurements in the left upper extremity are delineated above. Partially occlusive DVT in the left jugular vein. Electronically Signed   By: Arthur  HMarybelle Killingsn: 01/27/2017 14:02    Assessment/Plan 1. End stage renal disease (HCC) RecoBellflowernd:  At this time the patient does not have appropriate extremity access for dialysis  Vein mapping is reviewed and does not show adequate vein for fistula creation.  Patient should have a left brachial axillary AV graft created.  The risks, benefits and alternative therapies were reviewed in detail with the patient.  All questions were answered.  The patient agrees to proceed with surgery.  2. Controlled type 2 diabetes mellitus with stage 4 chronic kidney disease, with long-term current use of insulin (HCC) Continue hypoglycemic medications as already ordered, these medications have been reviewed and there are no changes at this time.  Hgb A1C to be monitored as already arranged by primary service   3. Chronic obstructive pulmonary disease, unspecified COPD type ([place]) Continue pulmonary medications and aerosols as already ordered, these medications have been reviewed and there are no changes at this time.    4. Essential hypertension Continue antihypertensive medications as already ordered, these medications have been reviewed and there are no changes at this time.   5. CAD in native artery Continue cardiac and antihypertensive medications as already ordered and reviewed, no changes at this time.  Continue statin as ordered and reviewed, no changes at this time  Nitrates PRN for chest pain     [name], MD  02/01/2017 9:56 AM

## 2017-02-02 ENCOUNTER — Telehealth: Payer: Self-pay

## 2017-02-02 ENCOUNTER — Other Ambulatory Visit: Payer: Self-pay

## 2017-02-02 NOTE — Telephone Encounter (Signed)
Hospital Follow-up call made to patient at this time. Message left for patient to call with any questions or concerns. Reminded of follow up appointment on 7/18 at 2:00PM with Dr. Rosana Hoes.

## 2017-02-03 ENCOUNTER — Ambulatory Visit: Payer: Medicare Other | Admitting: Surgery

## 2017-02-03 ENCOUNTER — Telehealth (INDEPENDENT_AMBULATORY_CARE_PROVIDER_SITE_OTHER): Payer: Self-pay

## 2017-02-03 ENCOUNTER — Telehealth: Payer: Self-pay | Admitting: General Practice

## 2017-02-03 NOTE — Telephone Encounter (Signed)
Spoke with patient informing her with Dr. Delana Meyer being the one who put the port in, he may want to remove it and to give his office a call and check with them before coming into our office for the port removal.

## 2017-02-03 NOTE — Telephone Encounter (Signed)
Patient called our office with questions regarding her port removal that was scheduled today with [place]. She wanted to know why someone had not called her to reschedule this appt. I explained that the appt was with [facility] surgical and that we had no knowledge of it, I did read the last telephone note from them stating that since Dr. Delana Meyer placed the port that he should be the person that removed it. I let her know that I would ask the doctor because she has a surgery coming up on 02/17/17 and he may want to wait until after this for the removal of her port.

## 2017-02-08 ENCOUNTER — Other Ambulatory Visit (INDEPENDENT_AMBULATORY_CARE_PROVIDER_SITE_OTHER): Payer: Self-pay

## 2017-02-08 ENCOUNTER — Encounter
Admission: RE | Admit: 2017-02-08 | Discharge: 2017-02-08 | Disposition: A | Discharge status: Home / Self Care | Payer: Medicare Other | Source: Ambulatory Visit | Attending: Vascular Surgery | Admitting: Vascular Surgery

## 2017-02-08 DIAGNOSIS — Z01812 Encounter for preprocedural laboratory examination: Secondary | ICD-10-CM | POA: Diagnosis present

## 2017-02-08 HISTORY — DX: Celiac disease: K90.0

## 2017-02-08 HISTORY — DX: Polyneuropathy, unspecified: G62.9

## 2017-02-08 HISTORY — DX: Dependence on renal dialysis: Z99.2

## 2017-02-08 HISTORY — DX: Atherosclerotic heart disease of native coronary artery without angina pectoris: I25.10

## 2017-02-08 HISTORY — DX: Unspecified asthma, uncomplicated: J45.909

## 2017-02-08 LAB — DIFFERENTIAL
BASOS PCT: 1 %
Basophils Absolute: 0.1 10*3/uL (ref 0–0.1)
EOS ABS: 0.1 10*3/uL (ref 0–0.7)
Eosinophils Relative: 3 %
Lymphocytes Relative: 24 %
Lymphs Abs: 1.3 10*3/uL (ref 1.0–3.6)
MONO ABS: 0.5 10*3/uL (ref 0.2–0.9)
MONOS PCT: 8 %
NEUTROS ABS: 3.6 10*3/uL (ref 1.4–6.5)
Neutrophils Relative %: 64 %

## 2017-02-08 LAB — CBC
HEMATOCRIT: 33.1 % — AB (ref 35.0–47.0)
Hemoglobin: 10.7 g/dL — ABNORMAL LOW (ref 12.0–16.0)
MCH: 31.9 pg (ref 26.0–34.0)
MCHC: 32.4 g/dL (ref 32.0–36.0)
MCV: 98.4 fL (ref 80.0–100.0)
Platelets: 269 10*3/uL (ref 150–440)
RBC: 3.36 MIL/uL — ABNORMAL LOW (ref 3.80–5.20)
RDW: 16.7 % — AB (ref 11.5–14.5)
WBC: 5.6 10*3/uL (ref 3.6–11.0)

## 2017-02-08 LAB — PROTIME-INR
INR: 1.08
PROTHROMBIN TIME: 14 s (ref 11.4–15.2)

## 2017-02-08 LAB — APTT: aPTT: 26 seconds (ref 24–36)

## 2017-02-08 LAB — COMPREHENSIVE METABOLIC PANEL
ALT: 10 U/L — AB (ref 14–54)
AST: 17 U/L (ref 15–41)
Albumin: 3.1 g/dL — ABNORMAL LOW (ref 3.5–5.0)
Alkaline Phosphatase: 83 U/L (ref 38–126)
Anion gap: 15 (ref 5–15)
BILIRUBIN TOTAL: 0.6 mg/dL (ref 0.3–1.2)
BUN: 59 mg/dL — AB (ref 6–20)
CALCIUM: 8.7 mg/dL — AB (ref 8.9–10.3)
CO2: 19 mmol/L — ABNORMAL LOW (ref 22–32)
CREATININE: 7.3 mg/dL — AB (ref 0.44–1.00)
Chloride: 104 mmol/L (ref 101–111)
GFR calc Af Amer: 6 mL/min — ABNORMAL LOW (ref >60)
GFR, EST NON AFRICAN AMERICAN: 5 mL/min — AB (ref >60)
Glucose, Bld: 81 mg/dL (ref 65–99)
Potassium: 4.8 mmol/L (ref 3.5–5.1)
Sodium: 138 mmol/L (ref 135–145)
TOTAL PROTEIN: 8 g/dL (ref 6.5–8.1)

## 2017-02-08 LAB — SURGICAL PCR SCREEN
MRSA, PCR: NEGATIVE
Staphylococcus aureus: NEGATIVE

## 2017-02-08 NOTE — Patient Instructions (Signed)
  Your procedure is scheduled on: February 17, 2017 [facility]) Report to Same Day Surgery 2nd floor medical mall [facility] Entrance-take elevator on left to 2nd floor.  Check in with surgery information desk.) To find out your arrival time please call [phone] between 1PM - 3PM on February 16, 2017 (TUESDAY) Remember: Instructions that are not followed completely may result in serious medical risk, up to and including death, or upon the discretion of your surgeon and anesthesiologist your surgery may need to be rescheduled.    _x___ 1. Do not eat food or drink liquids after midnight. No gum chewing or hard candies                              __x__ 2. No Alcohol for 24 hours before or after surgery.   __x__3. No Smoking for 24 prior to surgery.   ____  4. Bring all medications with you on the day of surgery if instructed.    __x__ 5. Notify your doctor if there is any change in your medical condition     (cold, fever, infections).     Do not wear jewelry, make-up, hairpins, clips or nail polish.  Do not wear lotions, powders, or perfumes.   Do not shave 48 hours prior to surgery. Men may shave face and neck.  Do not bring valuables to the hospital.    [facility] is not responsible for any belongings or valuables.               Contacts, dentures or bridgework may not be worn into surgery.  Leave your suitcase in the car. After surgery it may be brought to your room.  For patients admitted to the hospital, discharge time is determined by your treatment team                       Patients discharged the day of surgery will not be allowed to drive home.  You will need someone to drive you home and stay with you the night of your procedure.    Please read over the following fact sheets that you were given:   [facility] Preparing for Surgery and or MRSA Information   TAKE THE FOLLOWING MEDICATIONS WITH A SIP OF WATER THE MORNING OF SURGERY :  1. GABAPENTIN  2. NEXIUM ([place] AT  BEDTIME ON JULY 31 )    ____Fleets enema or Magnesium Citrate as directed.   _x___ Use CHG Soap or sage wipes as directed on instruction sheet   _x___ Use inhalers on the day of surgery and bring to hospital day of surgery (USE ALBUTEROL INHALER THE MORNING OF SURGERY AND [place])  ____ Stop Metformin and Janumet 2 days prior to surgery.    ____ Take 1/2 of usual insulin dose the night before surgery and none on the morning surgery     _x___ Follow recommendations from Cardiologist, Pulmonologist or PCP regarding          stopping Aspirin, Coumadin, Plavix ,Eliquis, Effient, or Pradaxa, and Pletal.  X____Stop Anti-inflammatories such as Advil, Aleve, Ibuprofen, Motrin, Naproxen, Naprosyn, Goodies powders or aspirin products. OK to take Tylenol    _x___ Stop supplements until after surgery.  But may continue Vitamin D, Vitamin B,and multivitamin        _x___ Bring C-Pap to the hospital.

## 2017-02-09 DIAGNOSIS — A419 Sepsis, unspecified organism: Secondary | ICD-10-CM

## 2017-02-09 DIAGNOSIS — Z959 Presence of cardiac and vascular implant and graft, unspecified: Secondary | ICD-10-CM

## 2017-02-09 DIAGNOSIS — R509 Fever, unspecified: Secondary | ICD-10-CM

## 2017-02-10 ENCOUNTER — Ambulatory Visit (INDEPENDENT_AMBULATORY_CARE_PROVIDER_SITE_OTHER): Payer: Medicare Other | Admitting: Urology

## 2017-02-10 ENCOUNTER — Encounter: Payer: Self-pay | Admitting: Urology

## 2017-02-10 VITALS — BP 89/47 | HR 76 | Ht 68.0 in | Wt 154.0 lb

## 2017-02-10 DIAGNOSIS — C679 Malignant neoplasm of bladder, unspecified: Secondary | ICD-10-CM | POA: Diagnosis not present

## 2017-02-10 DIAGNOSIS — N1330 Unspecified hydronephrosis: Secondary | ICD-10-CM

## 2017-02-10 LAB — URINALYSIS, COMPLETE
BILIRUBIN UA: NEGATIVE
GLUCOSE, UA: NEGATIVE
KETONES UA: NEGATIVE
NITRITE UA: NEGATIVE
SPEC GRAV UA: 1.025 (ref 1.005–1.030)
UUROB: 0.2 mg/dL (ref 0.2–1.0)
pH, UA: 7.5 (ref 5.0–7.5)

## 2017-02-10 LAB — MICROSCOPIC EXAMINATION

## 2017-02-10 MED ORDER — LIDOCAINE HCL 2 % EX GEL
1.0000 "application " | Freq: Once | CUTANEOUS | Status: AC
  Administered 2017-02-10: 1 via URETHRAL

## 2017-02-10 MED ORDER — CIPROFLOXACIN HCL 500 MG PO TABS
500.0000 mg | ORAL_TABLET | Freq: Once | ORAL | Status: AC
  Administered 2017-02-10: 500 mg via ORAL

## 2017-02-10 NOTE — Progress Notes (Signed)
   02/10/17  CC:  Chief Complaint  Patient presents with  . Cysto    HPI: 76 year old female with history of muscle invasive bladder cancer diagnosed in 01/2015 status post TURBT followed by palliative gemcitabine and concurrent radiation.    She also underwent recent CT PET scan on 01/01/2017 which shows marked bladder wall thickening and bilateral hydronephrosis despite ureteral stents in place. There is no evidence of metastatic disease. She is being followed closely by the cancer center as well.  She was last taken to the operating room on 10/12/2016 at which time the bladder was noted to be diffusely erythematous, small in capacity with newly identified right hydroureteronephrosis down to level of the distal ureter with a negative ureteroscopy. This possibly related to radiation changes of the distal ureter. Bilateral ureteral stents replaced at that time.  In the interim, she's been started on hemodialysis. 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 She is admitted on the same day to the hospitalist service for sepsis of unknown source, urine culture was negative.  Urinary frequency, urgency, and incontinence have improved now that the stents are out. This is not completely resolved but better.  Blood pressure 129/62, pulse 84, height 5' 8"  (1.727 m), weight 161 lb 14.4 oz (73.4 kg). NED. A&Ox3.   No respiratory distress   Abd soft, NT, ND Normal external genitalia with patent urethral meatus  Cystoscopy Procedure Note  Patient identification was confirmed, informed consent was obtained, and patient was prepped using Betadine solution.  Lidocaine jelly was administered per urethral meatus.    Preoperative abx where received prior to procedure.    Procedure: - Flexible cystoscope introduced, without any difficulty.   - Thorough search of the bladder revealed:    Pipe like urethral  meatus   Diffuse erythema throughout the bladder with a few small areas of erosion/ulceration consistent with history of radiation. No papillary tumors identified. Bilateral ureteral orifices identified and capacious.   Small clot was evacuated from bladder to optimize visualization  Post-Procedure: - Cystoscopy well tolerated today  Assessment/ Plan:  1. 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 - lidocaine (XYLOCAINE) 2 % jelly 1 application; Place 1 application into the urethra once. - Microscopic Examination  2. Hydronephrosis Likely related to bilateral radiation changes of the ureter/bladder Given that she is now on hemodialysis and thus no further role for renal optimization Stents removed 01/24/17  Return in about 3 months (around 05/13/2017) for Cystoscopy.   Hollice Espy, MD

## 2017-02-16 MED ORDER — CEFAZOLIN SODIUM-DEXTROSE 1-4 GM/50ML-% IV SOLN
1.0000 g | Freq: Once | INTRAVENOUS | Status: AC
  Administered 2017-02-17: 1 g via INTRAVENOUS

## 2017-02-17 ENCOUNTER — Ambulatory Visit: Payer: Medicare Other | Admitting: Anesthesiology

## 2017-02-17 ENCOUNTER — Ambulatory Visit
Admission: RE | Admit: 2017-02-17 | Discharge: 2017-02-17 | Disposition: A | Discharge status: Home / Self Care | Payer: Medicare Other | Source: Ambulatory Visit | Attending: Vascular Surgery | Admitting: Vascular Surgery

## 2017-02-17 ENCOUNTER — Encounter
Admission: RE | Disposition: A | Discharge status: Home / Self Care | Payer: Self-pay | Source: Ambulatory Visit | Attending: Vascular Surgery

## 2017-02-17 ENCOUNTER — Encounter: Payer: Self-pay | Admitting: *Deleted

## 2017-02-17 DIAGNOSIS — K219 Gastro-esophageal reflux disease without esophagitis: Secondary | ICD-10-CM | POA: Diagnosis not present

## 2017-02-17 DIAGNOSIS — G4730 Sleep apnea, unspecified: Secondary | ICD-10-CM | POA: Diagnosis not present

## 2017-02-17 DIAGNOSIS — I2510 Atherosclerotic heart disease of native coronary artery without angina pectoris: Secondary | ICD-10-CM | POA: Insufficient documentation

## 2017-02-17 DIAGNOSIS — E1151 Type 2 diabetes mellitus with diabetic peripheral angiopathy without gangrene: Secondary | ICD-10-CM | POA: Insufficient documentation

## 2017-02-17 DIAGNOSIS — Z9221 Personal history of antineoplastic chemotherapy: Secondary | ICD-10-CM | POA: Diagnosis not present

## 2017-02-17 DIAGNOSIS — Z992 Dependence on renal dialysis: Secondary | ICD-10-CM | POA: Diagnosis not present

## 2017-02-17 DIAGNOSIS — I120 Hypertensive chronic kidney disease with stage 5 chronic kidney disease or end stage renal disease: Secondary | ICD-10-CM | POA: Diagnosis present

## 2017-02-17 DIAGNOSIS — E1122 Type 2 diabetes mellitus with diabetic chronic kidney disease: Secondary | ICD-10-CM | POA: Diagnosis not present

## 2017-02-17 DIAGNOSIS — Z87891 Personal history of nicotine dependence: Secondary | ICD-10-CM | POA: Diagnosis not present

## 2017-02-17 DIAGNOSIS — Z9981 Dependence on supplemental oxygen: Secondary | ICD-10-CM | POA: Diagnosis not present

## 2017-02-17 DIAGNOSIS — D631 Anemia in chronic kidney disease: Secondary | ICD-10-CM | POA: Diagnosis not present

## 2017-02-17 DIAGNOSIS — J449 Chronic obstructive pulmonary disease, unspecified: Secondary | ICD-10-CM | POA: Insufficient documentation

## 2017-02-17 DIAGNOSIS — M1990 Unspecified osteoarthritis, unspecified site: Secondary | ICD-10-CM | POA: Diagnosis not present

## 2017-02-17 DIAGNOSIS — N186 End stage renal disease: Secondary | ICD-10-CM | POA: Insufficient documentation

## 2017-02-17 HISTORY — PX: AV FISTULA PLACEMENT: SHX1204

## 2017-02-17 LAB — TYPE AND SCREEN
ABO/RH(D): O POS
ANTIBODY SCREEN: NEGATIVE

## 2017-02-17 LAB — POCT I-STAT 4, (NA,K, GLUC, HGB,HCT)
Glucose, Bld: 85 mg/dL (ref 65–99)
HCT: 33 % — ABNORMAL LOW (ref 36.0–46.0)
HEMOGLOBIN: 11.2 g/dL — AB (ref 12.0–15.0)
Potassium: 5.3 mmol/L — ABNORMAL HIGH (ref 3.5–5.1)
Sodium: 142 mmol/L (ref 135–145)

## 2017-02-17 LAB — GLUCOSE, CAPILLARY
Glucose-Capillary: 89 mg/dL (ref 65–99)
Glucose-Capillary: 83 mg/dL (ref 65–99)

## 2017-02-17 SURGERY — INSERTION OF ARTERIOVENOUS (AV) GORE-TEX GRAFT ARM
Anesthesia: General | Laterality: Left | Wound class: Clean

## 2017-02-17 MED ORDER — SUCCINYLCHOLINE CHLORIDE 20 MG/ML IJ SOLN
INTRAMUSCULAR | Status: DC | PRN
  Administered 2017-02-17: 80 mg via INTRAVENOUS

## 2017-02-17 MED ORDER — NEOSTIGMINE METHYLSULFATE 10 MG/10ML IV SOLN
INTRAVENOUS | Status: DC | PRN
  Administered 2017-02-17: 4 mg via INTRAVENOUS

## 2017-02-17 MED ORDER — ONDANSETRON HCL 4 MG/2ML IJ SOLN
INTRAMUSCULAR | Status: DC | PRN
  Administered 2017-02-17: 4 mg via INTRAVENOUS

## 2017-02-17 MED ORDER — HEPARIN SODIUM (PORCINE) 1000 UNIT/ML IJ SOLN
INTRAMUSCULAR | Status: AC
  Filled 2017-02-17: qty 1

## 2017-02-17 MED ORDER — DEXAMETHASONE SODIUM PHOSPHATE 10 MG/ML IJ SOLN
INTRAMUSCULAR | Status: AC
  Filled 2017-02-17: qty 1

## 2017-02-17 MED ORDER — OXYCODONE HCL 5 MG PO TABS: 5.0000 mg | ORAL_TABLET | Freq: Once | ORAL | Status: DC | PRN

## 2017-02-17 MED ORDER — PAPAVERINE HCL 30 MG/ML IJ SOLN
INTRAMUSCULAR | Status: AC
  Filled 2017-02-17: qty 2

## 2017-02-17 MED ORDER — BUPIVACAINE HCL (PF) 0.5 % IJ SOLN
INTRAMUSCULAR | Status: AC
  Filled 2017-02-17: qty 30

## 2017-02-17 MED ORDER — TRAMADOL HCL 50 MG PO TABS: 50.0000 mg | ORAL_TABLET | Freq: Four times a day (QID) | ORAL | 0 refills | Status: DC | PRN

## 2017-02-17 MED ORDER — GLYCOPYRROLATE 0.2 MG/ML IJ SOLN
INTRAMUSCULAR | Status: AC
  Filled 2017-02-17: qty 1

## 2017-02-17 MED ORDER — OXYCODONE HCL 5 MG/5ML PO SOLN: 5.0000 mg | Freq: Once | ORAL | Status: DC | PRN

## 2017-02-17 MED ORDER — PROPOFOL 10 MG/ML IV BOLUS
INTRAVENOUS | Status: AC
  Filled 2017-02-17: qty 20

## 2017-02-17 MED ORDER — HEPARIN SODIUM (PORCINE) 5000 UNIT/ML IJ SOLN
INTRAMUSCULAR | Status: AC
  Filled 2017-02-17: qty 1

## 2017-02-17 MED ORDER — ROCURONIUM BROMIDE 50 MG/5ML IV SOLN
INTRAVENOUS | Status: AC
  Filled 2017-02-17: qty 1

## 2017-02-17 MED ORDER — ROCURONIUM BROMIDE 100 MG/10ML IV SOLN
INTRAVENOUS | Status: DC | PRN
  Administered 2017-02-17: 20 mg via INTRAVENOUS
  Administered 2017-02-17 (×2): 10 mg via INTRAVENOUS
  Administered 2017-02-17: 20 mg via INTRAVENOUS

## 2017-02-17 MED ORDER — SUCCINYLCHOLINE CHLORIDE 20 MG/ML IJ SOLN
INTRAMUSCULAR | Status: AC
  Filled 2017-02-17: qty 1

## 2017-02-17 MED ORDER — SODIUM CHLORIDE 0.9 % IV SOLN
INTRAVENOUS | Status: DC
  Administered 2017-02-17: 09:00:00 via INTRAVENOUS

## 2017-02-17 MED ORDER — FENTANYL CITRATE (PF) 100 MCG/2ML IJ SOLN: 25.0000 ug | INTRAMUSCULAR | Status: DC | PRN

## 2017-02-17 MED ORDER — GLYCOPYRROLATE 0.2 MG/ML IJ SOLN
INTRAMUSCULAR | Status: DC | PRN
  Administered 2017-02-17: 0.6 mg via INTRAVENOUS

## 2017-02-17 MED ORDER — BUPIVACAINE HCL (PF) 0.5 % IJ SOLN
INTRAMUSCULAR | Status: DC | PRN
  Administered 2017-02-17: 10 mL

## 2017-02-17 MED ORDER — ONDANSETRON HCL 4 MG/2ML IJ SOLN
INTRAMUSCULAR | Status: AC
  Filled 2017-02-17: qty 2

## 2017-02-17 MED ORDER — EPHEDRINE SULFATE 50 MG/ML IJ SOLN
INTRAMUSCULAR | Status: AC
  Filled 2017-02-17: qty 1

## 2017-02-17 MED ORDER — CEFAZOLIN SODIUM-DEXTROSE 1-4 GM/50ML-% IV SOLN
INTRAVENOUS | Status: AC
  Filled 2017-02-17: qty 50

## 2017-02-17 MED ORDER — NEOSTIGMINE METHYLSULFATE 10 MG/10ML IV SOLN
INTRAVENOUS | Status: AC
  Filled 2017-02-17: qty 1

## 2017-02-17 MED ORDER — FENTANYL CITRATE (PF) 100 MCG/2ML IJ SOLN
INTRAMUSCULAR | Status: AC
  Filled 2017-02-17: qty 2

## 2017-02-17 MED ORDER — SODIUM CHLORIDE 0.9 % IV SOLN
INTRAVENOUS | Status: DC | PRN
  Administered 2017-02-17: 50 mL via INTRAMUSCULAR

## 2017-02-17 MED ORDER — PROPOFOL 10 MG/ML IV BOLUS
INTRAVENOUS | Status: DC | PRN
  Administered 2017-02-17: 80 mg via INTRAVENOUS

## 2017-02-17 MED ORDER — EPHEDRINE SULFATE 50 MG/ML IJ SOLN
INTRAMUSCULAR | Status: DC | PRN
  Administered 2017-02-17 (×3): 10 mg via INTRAVENOUS

## 2017-02-17 MED ORDER — FENTANYL CITRATE (PF) 100 MCG/2ML IJ SOLN
INTRAMUSCULAR | Status: DC | PRN
  Administered 2017-02-17: 25 ug via INTRAVENOUS

## 2017-02-17 SURGICAL SUPPLY — 57 items
APPLIER CLIP 11 MED OPEN (CLIP)
APPLIER CLIP 9.375 SM OPEN (CLIP)
BAG COUNTER SPONGE EZ (MISCELLANEOUS) IMPLANT
BAG DECANTER FOR FLEXI CONT (MISCELLANEOUS) ×3 IMPLANT
BLADE SURG SZ11 CARB STEEL (BLADE) ×3 IMPLANT
BOOT SUTURE AID YELLOW STND (SUTURE) ×3 IMPLANT
BRUSH SCRUB 4% CHG (MISCELLANEOUS) ×3 IMPLANT
CANISTER SUCT 1200ML W/VALVE (MISCELLANEOUS) ×3 IMPLANT
CHLORAPREP W/TINT 26ML (MISCELLANEOUS) ×3 IMPLANT
CLIP APPLIE 11 MED OPEN (CLIP) IMPLANT
CLIP APPLIE 9.375 SM OPEN (CLIP) IMPLANT
COUNTER SPONGE BAG EZ (MISCELLANEOUS)
DERMABOND ADVANCED (GAUZE/BANDAGES/DRESSINGS) ×4
DERMABOND ADVANCED .7 DNX12 (GAUZE/BANDAGES/DRESSINGS) ×2 IMPLANT
DRESSING SURGICEL FIBRLLR 1X2 (HEMOSTASIS) ×1 IMPLANT
DRSG SURGICEL FIBRILLAR 1X2 (HEMOSTASIS) ×3
ELECT CAUTERY BLADE 6.4 (BLADE) ×3 IMPLANT
ELECT REM PT RETURN 9FT ADLT (ELECTROSURGICAL) ×3
ELECTRODE REM PT RTRN 9FT ADLT (ELECTROSURGICAL) ×1 IMPLANT
GLOVE BIO SURGEON STRL SZ7 (GLOVE) ×6 IMPLANT
GLOVE INDICATOR 7.5 STRL GRN (GLOVE) ×6 IMPLANT
GLOVE SURG SYN 8.0 (GLOVE) ×3 IMPLANT
GOWN STRL REUS W/ TWL LRG LVL3 (GOWN DISPOSABLE) ×2 IMPLANT
GOWN STRL REUS W/ TWL XL LVL3 (GOWN DISPOSABLE) ×1 IMPLANT
GOWN STRL REUS W/TWL LRG LVL3 (GOWN DISPOSABLE) ×4
GOWN STRL REUS W/TWL XL LVL3 (GOWN DISPOSABLE) ×2
GRAFT PROPATEN STD WALL 4 7X45 (Vascular Products) ×3 IMPLANT
IV NS 500ML (IV SOLUTION) ×2
IV NS 500ML BAXH (IV SOLUTION) ×1 IMPLANT
KIT RM TURNOVER STRD PROC AR (KITS) ×3 IMPLANT
LABEL OR SOLS (LABEL) ×3 IMPLANT
LOOP RED MAXI  1X406MM (MISCELLANEOUS) ×2
LOOP VESSEL MAXI 1X406 RED (MISCELLANEOUS) ×1 IMPLANT
LOOP VESSEL MINI 0.8X406 BLUE (MISCELLANEOUS) ×2 IMPLANT
LOOPS BLUE MINI 0.8X406MM (MISCELLANEOUS) ×4
NEEDLE FILTER BLUNT 18X 1/2SAF (NEEDLE) ×2
NEEDLE FILTER BLUNT 18X1 1/2 (NEEDLE) ×1 IMPLANT
NS IRRIG 500ML POUR BTL (IV SOLUTION) ×3 IMPLANT
PACK EXTREMITY ARMC (MISCELLANEOUS) ×3 IMPLANT
PAD PREP 24X41 OB/GYN DISP (PERSONAL CARE ITEMS) ×3 IMPLANT
PUNCH SURGICAL ROTATE 2.7MM (MISCELLANEOUS) IMPLANT
STOCKINETTE STRL 4IN 9604848 (GAUZE/BANDAGES/DRESSINGS) ×3 IMPLANT
SUT GTX CV-6 30 (SUTURE) ×6 IMPLANT
SUT MNCRL+ 5-0 UNDYED PC-3 (SUTURE) ×1 IMPLANT
SUT MONOCRYL 5-0 (SUTURE) ×2
SUT PROLENE 6 0 BV (SUTURE) ×6 IMPLANT
SUT SILK 2 0 (SUTURE) ×2
SUT SILK 2 0 SH (SUTURE) ×3 IMPLANT
SUT SILK 2-0 18XBRD TIE 12 (SUTURE) ×1 IMPLANT
SUT SILK 3 0 (SUTURE) ×2
SUT SILK 3-0 18XBRD TIE 12 (SUTURE) ×1 IMPLANT
SUT SILK 4 0 (SUTURE) ×2
SUT SILK 4-0 18XBRD TIE 12 (SUTURE) ×1 IMPLANT
SUT VIC AB 3-0 SH 27 (SUTURE) ×2
SUT VIC AB 3-0 SH 27X BRD (SUTURE) ×1 IMPLANT
SYR 20CC LL (SYRINGE) ×3 IMPLANT
SYR 3ML LL SCALE MARK (SYRINGE) ×3 IMPLANT

## 2017-02-17 NOTE — Anesthesia Postprocedure Evaluation (Signed)
Anesthesia Post Note  Patient: CAROLENA FAIRBANK  Procedure(s) Performed: Procedure(s) (LRB): INSERTION OF ARTERIOVENOUS (AV) GORE-TEX GRAFT ARM(BRACHIAL AXILLARY) (Left)  Patient location during evaluation: PACU Anesthesia Type: General Level of consciousness: awake and alert Pain management: pain level controlled Vital Signs Assessment: post-procedure vital signs reviewed and stable Respiratory status: spontaneous breathing, nonlabored ventilation, respiratory function stable and patient connected to nasal cannula oxygen Cardiovascular status: blood pressure returned to baseline and stable Postop Assessment: no signs of nausea or vomiting Anesthetic complications: no     Last Vitals:  Vitals:   02/17/17 1315 02/17/17 1413  BP: 132/79 (!) 128/59  Pulse: 74 75  Resp: 16   Temp: (!) 36.3 C     Last Pain:  Vitals:   02/17/17 1413  TempSrc:   PainSc: 2                  Precious Haws Piscitello

## 2017-02-17 NOTE — H&P (Signed)
[facility] History & Physical Update  The patient was interviewed and re-examined.  The patient's previous History and Physical has been reviewed and is unchanged.  There is no change in the plan of care. We plan to proceed with the scheduled procedure.  [name], MD  02/17/2017, 9:54 AM

## 2017-02-17 NOTE — Transfer of Care (Signed)
Immediate Anesthesia Transfer of Care Note  Patient: Barbara [name]  Procedure(s) Performed: Procedure(s): INSERTION OF ARTERIOVENOUS (AV) GORE-TEX GRAFT ARM(BRACHIAL AXILLARY) (Left)  Patient Location: PACU  Anesthesia Type:General  Level of Consciousness: awake, alert , oriented and patient cooperative  Airway & Oxygen Therapy: Patient Spontanous Breathing and Patient connected to nasal cannula oxygen  Post-op Assessment: Report given to RN and Post -op Vital signs reviewed and stable  Post vital signs: Reviewed and stable  Last Vitals:  Vitals:   02/17/17 0854 02/17/17 1225  BP: 126/61 126/76  Pulse: 65 77  Resp: 18 14  Temp: (!) 36.1 C (!) 36.3 C    Last Pain:  Vitals:   02/17/17 1225  TempSrc: Temporal  PainSc: 0-No pain         Complications: No apparent anesthesia complications

## 2017-02-17 NOTE — Anesthesia Post-op Follow-up Note (Cosign Needed)
Anesthesia QCDR form completed.        

## 2017-02-17 NOTE — Anesthesia Procedure Notes (Signed)
Procedure Name: Intubation Date/Time: 02/17/2017 10:47 AM Performed by: Darlyne Russian Pre-anesthesia Checklist: Patient identified, Emergency Drugs available, Suction available, Patient being monitored and Timeout performed Patient Re-evaluated:Patient Re-evaluated prior to induction Oxygen Delivery Method: Circle system utilized Preoxygenation: Pre-oxygenation with 100% oxygen Induction Type: IV induction Ventilation: Mask ventilation without difficulty Laryngoscope Size: Miller and 2 Grade View: Grade II Tube type: Oral Tube size: 7.0 mm Number of attempts: 2 Airway Equipment and Method: Stylet Placement Confirmation: ETT inserted through vocal cords under direct vision,  positive ETCO2 and breath sounds checked- equal and bilateral Secured at: 22 cm Tube secured with: Tape Dental Injury: Teeth and Oropharynx as per pre-operative assessment  Difficulty Due To: Difficulty was unanticipated and Difficult Airway- due to large tongue Future Recommendations: Recommend- induction with short-acting agent, and alternative techniques readily available

## 2017-02-17 NOTE — Op Note (Signed)
OPERATIVE NOTE   PROCEDURE: left brachial axillary arteriovenous graft placement  PRE-OPERATIVE DIAGNOSIS: End Stage Renal Disease  POST-OPERATIVE DIAGNOSIS: End Stage Renal Disease  SURGEON: [name]  ASSISTANT(S): None  ANESTHESIA: general  ESTIMATED BLOOD LOSS: <50 cc  FINDING(S): 3 mm brachial artery and an 11 mm axillary vein  SPECIMEN(S):  none  INDICATIONS:   Barbara [name] is a 76 y.o. female who presents with end stage renal disease.  The patient is scheduled for left brachial axillary AV graft placement.  The patient is aware the risks include but are not limited to: bleeding, infection, steal syndrome, nerve damage, ischemic monomelic neuropathy, failure to mature, and need for additional procedures.  The patient is aware of the risks of the procedure and elects to proceed forward.  DESCRIPTION: After full informed written consent was obtained from the patient, the patient was brought back to the operating room and placed supine upon the operating table.  Prior to induction, the patient received IV antibiotics.   After obtaining adequate anesthesia, the patient was then prepped and draped in the standard fashion for a left arm access procedure.    A linear incision was then created along the medial border of the biceps muscle just proximal to the antecubital crease and the brachial artery which was exposed through. The brachial artery was then looped proximally and distally with Silastic Vesseloops. Side branches were controlled with 4-0 silk ties.  Attention was then turned to the exposure of the axillary vein. Linear incision was then created medial to the proximal portion of the biceps at the level of the anterior axillary crease. The axillary vein was exposed and again looped proximally and distally with Silastic vessel loops. Associated tributaries were also controlled with Silastic Vesseloops.  The Gore tunneler was then delivered onto the field and a  subcutaneous path was made from the arterial incision to the venous incision. A 4-7 tapered PTFE propatent graft by Simeon Craft was then pulled through the subcutaneous tunnel. The arterial 4 mm portion was then approximated to the brachial artery. Brachial artery was controlled proximally and distally with the Silastic Vesseloops. Arteriotomy was made with an 11 blade scalpel and extended with Potts scissors and a 6-0 Prolene stay suture was placed. End graft to side brachial artery anastomosis was then fashioned with running CV 6 suture. Flushing maneuvers were performed suture line was hemostatic and the graft was then assessed for proper position and ease of future cannulation. Heparinized saline was infused into the vein and the graft was clamped with a vascular clamp. With the graft pressurized it was approximated to the axillary vein in its native bed and then marked with a surgical marker. The vein was then delivered into the surgical field and controlled with the Silastic vessel loops. Venotomy was then made with an 11 blade scalpel and extended with Potts scissors and a 6-0 Prolene suture was used as stay suture. The the graft was then sewn to the vein in an end graft to side vein fashion using running CV 6 suture.  Flushing maneuvers were performed and the artery was allowed to forward and back bleed.  Flow was then established through the AV graft  There was good  thrill in the venous outflow, and there was 1+ palpable radial pulse.  At this point, I irrigated out the surgical wounds.  There was no further active bleeding.  The subcutaneous tissue was reapproximated with a running stitch of 3-0 Vicryl.  The skin was  then reapproximated with a running subcuticular stitch of 4-0 Vicryl.  The skin was then cleaned, dried, and reinforced with Dermabond.    The patient tolerated this procedure well.   COMPLICATIONS: None  CONDITION: Barbara [name] [facility]  Office:  [phone]   02/17/2017, 12:21 PM

## 2017-02-17 NOTE — Discharge Instructions (Signed)
  AMBULATORY SURGERY  DISCHARGE INSTRUCTIONS   1) The drugs that you were given will stay in your system until tomorrow so for the next 24 hours you should not:  A) Drive an automobile B) Make any legal decisions C) Drink any alcoholic beverage   2) You may resume regular meals tomorrow.  Today it is better to start with liquids and gradually work up to solid foods.  You may eat anything you prefer, but it is better to start with liquids, then soup and crackers, and gradually work up to solid foods.   3) Please notify your doctor immediately if you have any unusual bleeding, trouble breathing, redness and pain at the surgery site, drainage, fever, or pain not relieved by medication.    4) Additional Instructions: TAKE A STOOL SOFTENER TWICE A DAY WHILE TAKING NARCOTIC PAIN MEDICINE TO PREVENT CONSTIPATION   Please contact your physician with any problems or Same Day Surgery at [phone], Monday through Friday 6 am to 4 pm, or [facility] [place] Main number at [phone].   

## 2017-02-17 NOTE — Anesthesia Preprocedure Evaluation (Signed)
Anesthesia Evaluation  Patient identified by MRN, date of birth, ID band Patient awake    Reviewed: Allergy & Precautions, H&P , NPO status , Patient's Chart, lab work & pertinent test results  History of Anesthesia Complications Negative for: history of anesthetic complications  Airway Mallampati: III  TM Distance: <3 FB Neck ROM: limited    Dental  (+) Poor Dentition, Chipped, Missing   Pulmonary shortness of breath and with exertion, asthma , sleep apnea , COPD,  COPD inhaler and oxygen dependent, former smoker,           Cardiovascular Exercise Tolerance: Poor hypertension, (-) angina+ CAD and + Peripheral Vascular Disease  (-) Past MI      Neuro/Psych Seizures -,   Neuromuscular disease negative psych ROS   GI/Hepatic Neg liver ROS, GERD  Medicated and Controlled,  Endo/Other  diabetes, Type 2  Renal/GU Dialysis and ESRFRenal disease     Musculoskeletal  (+) Arthritis ,   Abdominal   Peds  Hematology negative hematology ROS (+)   Anesthesia Other Findings Patient reports that she has no problems with oxycodone  Past Medical History: No date: Adult celiac disease 2017: Anemia associated with chronic renal failure     Comment:  blood transfusion last week 10/17 No date: Arthritis No date: Asthma 2017: Cancer ([place])     Comment:  bladder No date: Chronic kidney disease No date: CKD (chronic kidney disease)     Comment:  stage IV kidney disease.  dr. Candiss Norse and dr. Holley Raring               follow her No date: COPD (chronic obstructive pulmonary disease) ([place]) No date: Coronary artery disease No date: Diabetes mellitus without complication ([place]) No date: Dialysis patient [facility])     Comment:  Tues, Thurs, Sat No date: Dyspnea     Comment:  with exertion No date: Elevated lipids No date: GERD (gastroesophageal reflux disease) No date: Hematuria No date: Hypertension No date: Lower back pain No date:  Neuropathy No date: Oxygen dependent     Comment:  at hs No date: Personal history of chemotherapy No date: Sleep apnea     Comment:  uses cpap 01/2016: Urinary obstruction  Past Surgical History: No date: ABDOMINAL HYSTERECTOMY 02/17/2016: CYSTOSCOPY W/ RETROGRADES; Bilateral     Comment:  Procedure: CYSTOSCOPY WITH RETROGRADE PYELOGRAM;                Surgeon: Hollice Espy, MD;  Location: [facility];                Service: Urology;  Laterality: Bilateral; 10/12/2016: CYSTOSCOPY W/ RETROGRADES; Bilateral     Comment:  Procedure: CYSTOSCOPY WITH RETROGRADE PYELOGRAM;                Surgeon: Hollice Espy, MD;  Location: [facility];                Service: Urology;  Laterality: Bilateral; 05/12/2016: CYSTOSCOPY W/ URETERAL STENT PLACEMENT; Left     Comment:  Procedure: CYSTOSCOPY WITH STENT REPLACEMENT;  Surgeon:               Hollice Espy, MD;  Location: [facility];  Service:               Urology;  Laterality: Left; 10/12/2016: CYSTOSCOPY W/ URETERAL STENT PLACEMENT; Left     Comment:  Procedure: CYSTOSCOPY WITH STENT REPLACEMENT;  Surgeon:  Hollice Espy, MD;  Location: [facility];  Service:               Urology;  Laterality: Left; 01/21/2016: CYSTOSCOPY WITH STENT PLACEMENT; Left     Comment:  Procedure: CYSTOSCOPY WITH double J STENT PLACEMENT;                Surgeon: Franchot Gallo, MD;  Location: [facility];                Service: Urology;  Laterality: Left; 10/12/2016: CYSTOSCOPY WITH STENT PLACEMENT; Right     Comment:  Procedure: [place];  Surgeon:               Hollice Espy, MD;  Location: [facility];  Service:               Urology;  Laterality: Right; 11/20/2016: DIALYSIS/PERMA CATHETER INSERTION; N/A     Comment:  Procedure: Dialysis/Perma Catheter Insertion;  Surgeon:               Algernon Huxley, MD;  Location: [facility];                Service: Cardiovascular;  Laterality: N/A; 01/21/2016: KIDNEY SURGERY     Comment:  IR  NEPHROSTOMY PLACEMENT LEFT  04/07/2016: PERIPHERAL VASCULAR CATHETERIZATION; N/A     Comment:  Procedure: Porta Cath Insertion;  Surgeon: Katha Cabal, MD;  Location: [facility] [facility];  Service:               Cardiovascular;  Laterality: N/A; No date: PORTACATH PLACEMENT; Right No date: ROTATOR CUFF REPAIR; Left 02/17/2016: TRANSURETHRAL RESECTION OF BLADDER TUMOR; N/A     Comment:  Procedure: TRANSURETHRAL RESECTION OF BLADDER TUMOR               (TURBT)-LARGE;  Surgeon: Hollice Espy, MD;  Location:               [facility];  Service: Urology;  Laterality: N/A; 02/17/2016: URETEROSCOPY; Left     Comment:  Procedure: URETEROSCOPY;  Surgeon: Hollice Espy, MD;                Location: [facility];  Service: Urology;  Laterality: Left; 10/12/2016: URETEROSCOPY; Right     Comment:  Procedure: URETEROSCOPY;  Surgeon: Hollice Espy, MD;                Location: [facility];  Service: Urology;  Laterality:               Right;     Reproductive/Obstetrics negative OB ROS                             Anesthesia Physical Anesthesia Plan  ASA: IV  Anesthesia Plan: General ETT   Post-op Pain Management:    Induction: Intravenous  PONV Risk Score and Plan: 3 and Ondansetron, Dexamethasone and Treatment may vary due to age or medical condition  Airway Management Planned: Oral ETT  Additional Equipment:   Intra-op Plan:   Post-operative Plan: Extubation in OR and Possible Post-op intubation/ventilation  Informed Consent: I have reviewed the patients History and Physical, chart, labs and discussed the procedure including the risks, benefits and alternatives for the proposed anesthesia with the patient or authorized representative who has indicated his/her understanding and acceptance.   Dental Advisory  Given  Plan Discussed with: Anesthesiologist, CRNA and Surgeon  Anesthesia Plan Comments: (Patient informed that they are higher risk for  complications from anesthesia during this procedure due to their medical history.  Patient voiced understanding.  Patient consented for risks of anesthesia including but not limited to:  - adverse reactions to medications - damage to teeth, lips or other oral mucosa - sore throat or hoarseness - Damage to heart, brain, lungs or loss of life  Patient voiced understanding.)        Anesthesia Quick Evaluation

## 2017-03-04 ENCOUNTER — Encounter (INDEPENDENT_AMBULATORY_CARE_PROVIDER_SITE_OTHER): Payer: Self-pay | Admitting: Vascular Surgery

## 2017-03-04 ENCOUNTER — Ambulatory Visit (INDEPENDENT_AMBULATORY_CARE_PROVIDER_SITE_OTHER): Payer: Medicare Other | Admitting: Vascular Surgery

## 2017-03-04 VITALS — BP 113/55 | HR 76 | Resp 16 | Ht 68.0 in | Wt 158.6 lb

## 2017-03-04 DIAGNOSIS — I10 Essential (primary) hypertension: Secondary | ICD-10-CM

## 2017-03-04 DIAGNOSIS — I2510 Atherosclerotic heart disease of native coronary artery without angina pectoris: Secondary | ICD-10-CM

## 2017-03-04 DIAGNOSIS — N186 End stage renal disease: Secondary | ICD-10-CM

## 2017-03-07 NOTE — Progress Notes (Signed)
MRN : [id number]  Barbara [name] is a 76 y.o. (1940/07/23) female who presents with chief complaint of  Chief Complaint  Patient presents with  . Follow-up    2 weeks f/u  .  History of Present Illness: The patient returns to the office for followup of their dialysis access.  She is s/p left brachial axillary graft creation on 02/17/2017.  The function of the access has been stable. The patient denies increased bleeding time or increased recirculation. Patient denies difficulty with cannulation. The patient denies hand pain or other symptoms consistent with steal phenomena.  No significant arm swelling.  The patient denies redness or swelling at the access site. The patient denies fever or chills at home or while on dialysis.  The patient denies amaurosis fugax or recent TIA symptoms. There are no recent neurological changes noted. The patient denies claudication symptoms or rest pain symptoms. The patient denies history of DVT, PE or superficial thrombophlebitis. The patient denies recent episodes of angina or shortness of breath.        Current Meds  Medication Sig  . ACCU-CHEK AVIVA PLUS test strip 1 each by Other route daily. use as directed  . acetaminophen (TYLENOL) 500 MG tablet Take 500-1,000 mg by mouth every 6 (six) hours as needed (for pain/headache).   . Albuterol Sulfate (PROAIR RESPICLICK) 546 (90 Base) MCG/ACT AEPB Inhale 2 puffs into the lungs 4 (four) times daily as needed (wheezing).   Marland Kitchen atorvastatin (LIPITOR) 40 MG tablet Take 40 mg by mouth every evening.  . calcium acetate (PHOSLO) 667 MG capsule Take 2 capsules (1,334 mg total) by mouth 3 (three) times daily with meals. (Patient taking differently: Take 667 mg by mouth 2 (two) times daily. 1 capsule with breakfast & 1 capsule with lunch)  . cholecalciferol (VITAMIN D) 1000 units tablet Take 1,000 Units by mouth daily.  Marland Kitchen esomeprazole (NEXIUM) 40 MG capsule Take 40 mg by mouth daily after breakfast.   . ferrous  sulfate 325 (65 FE) MG EC tablet Take 325 mg by mouth daily after breakfast.   . furosemide (LASIX) 40 MG tablet Take 40 mg by mouth 2 (two) times daily.  Marland Kitchen gabapentin (NEURONTIN) 300 MG capsule Take 600 mg by mouth 3 (three) times daily.  Marland Kitchen lidocaine (XYLOCAINE) 5 % ointment Apply 1 application topically daily as needed for mild pain.   Marland Kitchen MYRBETRIQ 25 MG TB24 tablet TAKE 1 TABLET (25 MG TOTAL) BY MOUTH DAILY. (Patient taking differently: Take 25 mg by mouth daily after breakfast. )  . NON FORMULARY Place 1 Units into the nose at bedtime. CPAP Time of use 2100-0600  . OXYGEN Inhale 2 L into the lungs at bedtime.  . traMADol (ULTRAM) 50 MG tablet Take 1 tablet (50 mg total) by mouth every 6 (six) hours as needed for moderate pain or severe pain.  . TRAVATAN Z 0.004 % SOLN ophthalmic solution PLACE 1 DROP INTO BOTH EYES EVERY NIGHT AT BEDTIME    Past Medical History:  Diagnosis Date  . Adult celiac disease   . Anemia associated with chronic renal failure 2017   blood transfusion last week 10/17  . Arthritis   . Asthma   . Cancer ([place]) 2017   bladder  . Chronic kidney disease   . 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  dr. Candiss Norse and dr. Holley Raring follow her  . COPD (chronic obstructive pulmonary disease) ([place])   . Coronary artery disease   .  Diabetes mellitus without complication ([place])   . Dialysis patient ([place])    Tues, Thurs, Sat  . Dyspnea    with exertion  . Elevated lipids   . GERD (gastroesophageal reflux disease)   . Hematuria   . Hypertension   . Lower back pain   . Neuropathy   . Oxygen dependent    at hs  . Personal history of chemotherapy   . Sleep apnea    uses cpap  . Urinary obstruction 01/2016    Past Surgical History:  Procedure Laterality Date  . ABDOMINAL HYSTERECTOMY    . AV FISTULA PLACEMENT Left 02/17/2017   Procedure: INSERTION OF ARTERIOVENOUS (AV) GORE-TEX GRAFT ARM(BRACHIAL AXILLARY);  Surgeon: Katha Cabal, MD;  Location: [facility]  ORS;  Service: Vascular;  Laterality: Left;  . CYSTOSCOPY W/ RETROGRADES Bilateral 02/17/2016   Procedure: CYSTOSCOPY WITH RETROGRADE PYELOGRAM;  Surgeon: Hollice Espy, MD;  Location: [facility] ORS;  Service: Urology;  Laterality: Bilateral;  . CYSTOSCOPY W/ RETROGRADES Bilateral 10/12/2016   Procedure: CYSTOSCOPY WITH RETROGRADE PYELOGRAM;  Surgeon: Hollice Espy, MD;  Location: [facility] ORS;  Service: Urology;  Laterality: Bilateral;  . CYSTOSCOPY W/ URETERAL STENT PLACEMENT Left 05/12/2016   Procedure: CYSTOSCOPY WITH STENT REPLACEMENT;  Surgeon: Hollice Espy, MD;  Location: [facility] ORS;  Service: Urology;  Laterality: Left;  . CYSTOSCOPY W/ URETERAL STENT PLACEMENT Left 10/12/2016   Procedure: CYSTOSCOPY WITH STENT REPLACEMENT;  Surgeon: Hollice Espy, MD;  Location: [facility] ORS;  Service: Urology;  Laterality: Left;  . CYSTOSCOPY WITH STENT PLACEMENT Left 01/21/2016   Procedure: CYSTOSCOPY WITH double J STENT PLACEMENT;  Surgeon: Franchot Gallo, MD;  Location: [facility] ORS;  Service: Urology;  Laterality: Left;  . CYSTOSCOPY WITH STENT PLACEMENT Right 10/12/2016   Procedure: CYSTOSCOPY WITH STENT PLACEMENT;  Surgeon: Hollice Espy, MD;  Location: [facility] ORS;  Service: Urology;  Laterality: Right;  . DIALYSIS/PERMA CATHETER INSERTION N/A 11/20/2016   Procedure: Dialysis/Perma Catheter Insertion;  Surgeon: Algernon Huxley, MD;  Location: [place] [facility];  Service: Cardiovascular;  Laterality: N/A;  . KIDNEY SURGERY  01/21/2016   IR NEPHROSTOMY PLACEMENT LEFT   . PERIPHERAL VASCULAR CATHETERIZATION N/A 04/07/2016   Procedure: Glori Luis Cath Insertion;  Surgeon: Katha Cabal, MD;  Location: [place] [facility];  Service: Cardiovascular;  Laterality: N/A;  . PORTACATH PLACEMENT Right   . ROTATOR CUFF REPAIR Left   . TRANSURETHRAL RESECTION OF BLADDER TUMOR N/A 02/17/2016   Procedure: TRANSURETHRAL RESECTION OF BLADDER TUMOR (TURBT)-LARGE;  Surgeon: Hollice Espy, MD;  Location: [facility] ORS;  Service: Urology;   Laterality: N/A;  . URETEROSCOPY Left 02/17/2016   Procedure: URETEROSCOPY;  Surgeon: Hollice Espy, MD;  Location: [facility] ORS;  Service: Urology;  Laterality: Left;  . URETEROSCOPY Right 10/12/2016   Procedure: URETEROSCOPY;  Surgeon: Hollice Espy, MD;  Location: [facility] ORS;  Service: Urology;  Laterality: Right;    Social History Social History  Substance Use Topics  . Smoking status: Former Smoker    Types: Cigarettes    Quit date: 07/20/2002  . Smokeless tobacco: Never Used     Comment: 01/22/2016   "  quit smoking many years ago "  . Alcohol use No    Family History Family History  Problem Relation Age of Onset  . Diabetes Mother   . Cancer Father   . Breast cancer Neg Hx   . Kidney disease Neg Hx     Allergies  Allergen Reactions  . Glipizide Er     Dropped blood sugar too much  .  Percocet [Oxycodone-Acetaminophen] Hives     REVIEW OF SYSTEMS (Negative unless checked)  Constitutional: [] Weight loss  [] Fever  [] Chills Cardiac: [] Chest pain   [] Chest pressure   [] Palpitations   [] Shortness of breath when laying flat   [] Shortness of breath with exertion. Vascular:  [] Pain in legs with walking   [] Pain in legs at rest  [] History of DVT   [] Phlebitis   [] Swelling in legs   [] Varicose veins   [] Non-healing ulcers Pulmonary:   [] Uses home oxygen   [] Productive cough   [] Hemoptysis   [] Wheeze  [] COPD   [] Asthma Neurologic:  [] Dizziness   [] Seizures   [] History of stroke   [] History of TIA  [] Aphasia   [] Vissual changes   [] Weakness or numbness in arm   [] Weakness or numbness in leg Musculoskeletal:   [] Joint swelling   [] Joint pain   [] Low back pain Hematologic:  [] Easy bruising  [] Easy bleeding   [] Hypercoagulable state   [] Anemic Gastrointestinal:  [] Diarrhea   [] Vomiting  [] Gastroesophageal reflux/heartburn   [] Difficulty swallowing. Genitourinary:  [x] Chronic kidney disease   [] Difficult urination  [] Frequent urination   [] Blood in urine Skin:  [] Rashes   [] Ulcers    Psychological:  [] History of anxiety   []  History of major depression.  Physical Examination  Vitals:   03/04/17 1530  BP: (!) 113/55  Pulse: 76  Resp: 16  Weight: 71.9 kg (158 lb 9.6 oz)  Height: 5' 8"  (1.727 m)   Body mass index is 24.12 kg/m. Gen: WD/WN, NAD Head: /AT, No temporalis wasting.  Ear/Nose/Throat: Hearing grossly intact, nares w/o erythema or drainage Eyes: PER, EOMI, sclera nonicteric.  Neck: Supple, no large masses.   Pulmonary:  Good air movement, no audible wheezing bilaterally, no use of accessory muscles.  Cardiac: RRR, no JVD Vascular: left brachial axillary graft with good thrill and good bruit Vessel Right Left  Radial Palpable Palpable  Ulnar Palpable Palpable  Brachial Palpable Palpable  Gastrointestinal: Non-distended. No guarding/no peritoneal signs.  Musculoskeletal: M/S 5/5 throughout.  No deformity or atrophy.  Neurologic: CN 2-12 intact. Symmetrical.  Speech is fluent. Motor exam as listed above. Psychiatric: Judgment intact, Mood & affect appropriate for pt's clinical situation. Dermatologic: No rashes or ulcers noted.  No changes consistent with cellulitis. Lymph : No lichenification or skin changes of chronic lymphedema.  CBC Lab Results  Component Value Date   WBC 5.6 02/08/2017   HGB 11.2 (L) 02/17/2017   HCT 33.0 (L) 02/17/2017   MCV 98.4 02/08/2017   PLT 269 02/08/2017    BMET    Component Value Date/Time   NA 142 02/17/2017 0910   NA 138 11/04/2016 1453   NA 138 08/07/2012 0433   K 5.3 (H) 02/17/2017 0910   K 3.2 (L) 10/30/2014 1410   CL 104 02/08/2017 1038   CL 104 08/07/2012 0433   CO2 19 (L) 02/08/2017 1038   CO2 25 08/07/2012 0433   GLUCOSE 85 02/17/2017 0910   GLUCOSE 172 (H) 08/07/2012 0433   BUN 59 (H) 02/08/2017 1038   BUN 69 (H) 11/04/2016 1453   BUN 63 (H) 08/07/2012 0433   CREATININE 7.30 (H) 02/08/2017 1038   CREATININE 2.48 (H) 08/07/2012 0433   CALCIUM 8.7 (L) 02/08/2017 1038   CALCIUM 9.1  08/07/2012 0433   GFRNONAA 5 (L) 02/08/2017 1038   GFRNONAA 19 (L) 08/07/2012 0433   GFRAA 6 (L) 02/08/2017 1038   GFRAA 22 (L) 08/07/2012 0433   CrCl cannot be calculated (Patient's most recent lab  result is older than the maximum 21 days allowed.).  COAG Lab Results  Component Value Date   INR 1.08 02/08/2017   INR 1.23 01/25/2016   INR 1.38 01/24/2016    Radiology No results found.  Assessment/Plan 1. End stage renal disease ([place]) Recommend:  The patient is doing well and currently has adequate dialysis access. The patient's dialysis center is not reporting any access issues. Flow pattern is stable when compared to the prior ultrasound.  The patient should have a duplex ultrasound of the dialysis access in 6 months.  The patient will follow-up with me in the office after each ultrasound   - VAS [place] (AVF,AVG); Future  2. Benign essential HTN Continue antihypertensive medications as already ordered, these medications have been reviewed and there are no changes at this time.   3.  CAD in native artery Continue cardiac and antihypertensive medications as already ordered and reviewed, no changes at this time.  Continue statin as ordered and reviewed, no changes at this time  Nitrates PRN for chest pain     [name], MD  03/07/2017 1:42 PM

## 2017-03-11 ENCOUNTER — Other Ambulatory Visit: Payer: Self-pay | Admitting: *Deleted

## 2017-03-11 DIAGNOSIS — N189 Chronic kidney disease, unspecified: Principal | ICD-10-CM

## 2017-03-11 DIAGNOSIS — D631 Anemia in chronic kidney disease: Secondary | ICD-10-CM

## 2017-03-12 ENCOUNTER — Inpatient Hospital Stay: Payer: Medicare Other | Attending: Internal Medicine

## 2017-03-12 ENCOUNTER — Inpatient Hospital Stay: Payer: Medicare Other

## 2017-03-12 ENCOUNTER — Inpatient Hospital Stay (HOSPITAL_BASED_OUTPATIENT_CLINIC_OR_DEPARTMENT_OTHER): Payer: Medicare Other | Admitting: Internal Medicine

## 2017-03-12 VITALS — BP 125/54 | HR 76 | Temp 97.6°F | Resp 16 | Wt 159.0 lb

## 2017-03-12 DIAGNOSIS — C670 Malignant neoplasm of trigone of bladder: Secondary | ICD-10-CM

## 2017-03-12 DIAGNOSIS — J449 Chronic obstructive pulmonary disease, unspecified: Secondary | ICD-10-CM | POA: Diagnosis not present

## 2017-03-12 DIAGNOSIS — N184 Chronic kidney disease, stage 4 (severe): Secondary | ICD-10-CM

## 2017-03-12 DIAGNOSIS — I2510 Atherosclerotic heart disease of native coronary artery without angina pectoris: Secondary | ICD-10-CM | POA: Diagnosis not present

## 2017-03-12 DIAGNOSIS — N189 Chronic kidney disease, unspecified: Secondary | ICD-10-CM

## 2017-03-12 DIAGNOSIS — E1122 Type 2 diabetes mellitus with diabetic chronic kidney disease: Secondary | ICD-10-CM | POA: Diagnosis not present

## 2017-03-12 DIAGNOSIS — Z9981 Dependence on supplemental oxygen: Secondary | ICD-10-CM | POA: Insufficient documentation

## 2017-03-12 DIAGNOSIS — Z95828 Presence of other vascular implants and grafts: Secondary | ICD-10-CM

## 2017-03-12 DIAGNOSIS — Z992 Dependence on renal dialysis: Secondary | ICD-10-CM | POA: Diagnosis not present

## 2017-03-12 DIAGNOSIS — Z79899 Other long term (current) drug therapy: Secondary | ICD-10-CM

## 2017-03-12 DIAGNOSIS — I129 Hypertensive chronic kidney disease with stage 1 through stage 4 chronic kidney disease, or unspecified chronic kidney disease: Secondary | ICD-10-CM | POA: Insufficient documentation

## 2017-03-12 DIAGNOSIS — Z87891 Personal history of nicotine dependence: Secondary | ICD-10-CM | POA: Insufficient documentation

## 2017-03-12 DIAGNOSIS — G4730 Sleep apnea, unspecified: Secondary | ICD-10-CM | POA: Insufficient documentation

## 2017-03-12 DIAGNOSIS — K219 Gastro-esophageal reflux disease without esophagitis: Secondary | ICD-10-CM | POA: Insufficient documentation

## 2017-03-12 DIAGNOSIS — D631 Anemia in chronic kidney disease: Secondary | ICD-10-CM | POA: Diagnosis not present

## 2017-03-12 DIAGNOSIS — N1330 Unspecified hydronephrosis: Secondary | ICD-10-CM | POA: Insufficient documentation

## 2017-03-12 LAB — COMPREHENSIVE METABOLIC PANEL
ALT: 12 U/L — AB (ref 14–54)
AST: 20 U/L (ref 15–41)
Albumin: 3.8 g/dL (ref 3.5–5.0)
Alkaline Phosphatase: 112 U/L (ref 38–126)
Anion gap: 13 (ref 5–15)
BILIRUBIN TOTAL: 0.4 mg/dL (ref 0.3–1.2)
BUN: 45 mg/dL — AB (ref 6–20)
CHLORIDE: 106 mmol/L (ref 101–111)
CO2: 20 mmol/L — AB (ref 22–32)
CREATININE: 5.9 mg/dL — AB (ref 0.44–1.00)
Calcium: 8.2 mg/dL — ABNORMAL LOW (ref 8.9–10.3)
GFR calc Af Amer: 7 mL/min — ABNORMAL LOW (ref >60)
GFR, EST NON AFRICAN AMERICAN: 6 mL/min — AB (ref >60)
GLUCOSE: 110 mg/dL — AB (ref 65–99)
Potassium: 4.1 mmol/L (ref 3.5–5.1)
Sodium: 139 mmol/L (ref 135–145)
Total Protein: 8.6 g/dL — ABNORMAL HIGH (ref 6.5–8.1)

## 2017-03-12 LAB — CBC WITH DIFFERENTIAL/PLATELET
BASOS ABS: 0 10*3/uL (ref 0–0.1)
Basophils Relative: 1 %
Eosinophils Absolute: 0.1 10*3/uL (ref 0–0.7)
Eosinophils Relative: 2 %
HEMATOCRIT: 37.9 % (ref 35.0–47.0)
HEMOGLOBIN: 12.3 g/dL (ref 12.0–16.0)
LYMPHS PCT: 23 %
Lymphs Abs: 1.2 10*3/uL (ref 1.0–3.6)
MCH: 31.6 pg (ref 26.0–34.0)
MCHC: 32.5 g/dL (ref 32.0–36.0)
MCV: 97.4 fL (ref 80.0–100.0)
Monocytes Absolute: 0.4 10*3/uL (ref 0.2–0.9)
Monocytes Relative: 8 %
NEUTROS ABS: 3.4 10*3/uL (ref 1.4–6.5)
NEUTROS PCT: 66 %
Platelets: 141 10*3/uL — ABNORMAL LOW (ref 150–440)
RBC: 3.89 MIL/uL (ref 3.80–5.20)
RDW: 17.1 % — ABNORMAL HIGH (ref 11.5–14.5)
WBC: 5.2 10*3/uL (ref 3.6–11.0)

## 2017-03-12 LAB — SAMPLE TO BLOOD BANK

## 2017-03-12 LAB — IRON AND TIBC
IRON: 46 ug/dL (ref 28–170)
Saturation Ratios: 19 % (ref 10.4–31.8)
TIBC: 248 ug/dL — ABNORMAL LOW (ref 250–450)
UIBC: 202 ug/dL

## 2017-03-12 LAB — FERRITIN: Ferritin: 206 ng/mL (ref 11–307)

## 2017-03-12 MED ORDER — SODIUM CHLORIDE 0.9% FLUSH
10.0000 mL | INTRAVENOUS | Status: DC | PRN
  Administered 2017-03-12: 10 mL via INTRAVENOUS
  Filled 2017-03-12: qty 10

## 2017-03-12 MED ORDER — HEPARIN SOD (PORK) LOCK FLUSH 100 UNIT/ML IV SOLN
500.0000 [IU] | Freq: Once | INTRAVENOUS | Status: AC
  Administered 2017-03-12: 500 [IU] via INTRAVENOUS

## 2017-03-12 NOTE — Assessment & Plan Note (Addendum)
#   muscle invasive bladder cancer/c T2/stage II- not a candidate for cystectomy given multiple comorbidities s/p RT and weekly gemx2. Discontinued due to poor tolerance 06/2016.   # Repeat cystoscopy July 2018 - neg for recurrence. Last PET scan 01/01/17 was negative for metastatic disease.  #anemia/hemoglobin 12.3 today. No need for transfusion today but will repeat labs in 3 months for consideration of transfusion.  #severe stage IV CKD- creatinine 5.9. Scheduled for hemodialysis tomorrow and remains on TTS schedule. Nephrology follows and Vascular monitors her accesses.   #  Patient continues to have port in place. Will need to flush today and will plan to re-flush when she returns for labs in 3 months.   # follow up in 3 months with labs and see MD for further evaluation.   Addendum: I personally interviewed and examined the patient. Agreed with the above plan of care. Patient/family questions were answered. Dr.Brahmanday MD

## 2017-03-12 NOTE — Progress Notes (Signed)
[facility] OFFICE PROGRESS NOTE  Patient Care Team: Madelyn Brunner, MD as PCP - General (Internal Medicine)  Cancer Staging No matching staging information was found for the patient.   Oncology History    #AUG 2017 TURBT- [Dr.Brandon] Large nodular proximally 5 cm bladder mass involving the left hemitrigone extending up the left lateral bladder wall, 2 discrete nodules with bridge of abnormal tissue; Tumor clinically T2. TURBT- Tumor invades muscularis propria [detrusor muscle]. NOT candidate for cystectomy.  # SEP 2017- RT- GEM weekly.  # Acute Renal failure/CKD IV / C.diff [aug 2017]     Cancer of trigone of urinary bladder ([place])   03/02/2016 Initial Diagnosis    Cancer of trigone of urinary bladder ([place])          INTERVAL HISTORY:  Barbara [name] 76 y.o.  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 She returns to clinic today for follow-up.  In the interim patient was evaluated by urology with a cystoscopy; no obvious tumor was noted and she began to receive hemodialysis on a TTS schedule. She had a left brachial axillary graft created on 02/17/17.  CT from 01/01/17 showed bladder wall thickening and bilateral hydronephrosis despite the placement of uretal   Today, she denies fatigue, or shortness of breath. She denies bloody or black stools. Denies recent falls. She is alone today and uses a cane for ambulation. She was discharged from hospital on 01/29/17 for sepsis. Today she denies pain in the lower abdomen, bloody or discolored urine.    REVIEW OF SYSTEMS:  A complete 10 point review of system is done which is negative except mentioned above/history of present illness.   PAST MEDICAL HISTORY :  Past Medical History:  Diagnosis Date  . Adult celiac disease   . Anemia associated with  chronic renal failure 2017   blood transfusion last week 10/17  . Arthritis   . Asthma   . Cancer ([place]) 2017   bladder  . Chronic kidney disease   . 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  dr. Candiss Norse and dr. Holley Raring follow her  . COPD (chronic obstructive pulmonary disease) ([place])   . Coronary artery disease   . Diabetes mellitus without complication ([place])   . Dialysis patient ([place])    Tues, Thurs, Sat  . Dyspnea    with exertion  . Elevated lipids   . GERD (gastroesophageal reflux disease)   . Hematuria   . Hypertension   . Lower back pain   . Neuropathy   . Oxygen dependent    at hs  . Personal history of chemotherapy   . Sleep apnea    uses cpap  . Urinary obstruction 01/2016    PAST SURGICAL HISTORY :   Past Surgical History:  Procedure Laterality Date  . ABDOMINAL HYSTERECTOMY    . AV FISTULA PLACEMENT Left 02/17/2017   Procedure: INSERTION OF ARTERIOVENOUS (AV) GORE-TEX GRAFT ARM(BRACHIAL AXILLARY);  Surgeon: Katha Cabal, MD;  Location: [facility] ORS;  Service: Vascular;  Laterality: Left;  . CYSTOSCOPY W/ RETROGRADES Bilateral 02/17/2016   Procedure: CYSTOSCOPY WITH RETROGRADE PYELOGRAM;  Surgeon: Hollice Espy, MD;  Location: [facility] ORS;  Service: Urology;  Laterality: Bilateral;  . CYSTOSCOPY W/ RETROGRADES Bilateral 10/12/2016   Procedure: CYSTOSCOPY WITH RETROGRADE PYELOGRAM;  Surgeon: Hollice Espy, MD;  Location: [facility]  ORS;  Service: Urology;  Laterality: Bilateral;  . CYSTOSCOPY W/ URETERAL STENT PLACEMENT Left 05/12/2016   Procedure: CYSTOSCOPY WITH STENT REPLACEMENT;  Surgeon: Hollice Espy, MD;  Location: [facility] ORS;  Service: Urology;  Laterality: Left;  . CYSTOSCOPY W/ URETERAL STENT PLACEMENT Left 10/12/2016   Procedure: CYSTOSCOPY WITH STENT REPLACEMENT;  Surgeon: Hollice Espy, MD;  Location: [facility] ORS;  Service: Urology;  Laterality: Left;  . CYSTOSCOPY WITH STENT PLACEMENT Left 01/21/2016   Procedure: CYSTOSCOPY WITH double J STENT  PLACEMENT;  Surgeon: Franchot Gallo, MD;  Location: [facility] ORS;  Service: Urology;  Laterality: Left;  . CYSTOSCOPY WITH STENT PLACEMENT Right 10/12/2016   Procedure: CYSTOSCOPY WITH STENT PLACEMENT;  Surgeon: Hollice Espy, MD;  Location: [facility] ORS;  Service: Urology;  Laterality: Right;  . DIALYSIS/PERMA CATHETER INSERTION N/A 11/20/2016   Procedure: Dialysis/Perma Catheter Insertion;  Surgeon: Algernon Huxley, MD;  Location: [facility] CV LAB;  Service: Cardiovascular;  Laterality: N/A;  . KIDNEY SURGERY  01/21/2016   IR NEPHROSTOMY PLACEMENT LEFT   . PERIPHERAL VASCULAR CATHETERIZATION N/A 04/07/2016   Procedure: Glori Luis Cath Insertion;  Surgeon: Katha Cabal, MD;  Location: [place] CV LAB;  Service: Cardiovascular;  Laterality: N/A;  . PORTACATH PLACEMENT Right   . ROTATOR CUFF REPAIR Left   . TRANSURETHRAL RESECTION OF BLADDER TUMOR N/A 02/17/2016   Procedure: TRANSURETHRAL RESECTION OF BLADDER TUMOR (TURBT)-LARGE;  Surgeon: Hollice Espy, MD;  Location: [facility] ORS;  Service: Urology;  Laterality: N/A;  . URETEROSCOPY Left 02/17/2016   Procedure: URETEROSCOPY;  Surgeon: Hollice Espy, MD;  Location: [facility] ORS;  Service: Urology;  Laterality: Left;  . URETEROSCOPY Right 10/12/2016   Procedure: URETEROSCOPY;  Surgeon: Hollice Espy, MD;  Location: [facility] ORS;  Service: Urology;  Laterality: Right;    FAMILY HISTORY :   Family History  Problem Relation Age of Onset  . Diabetes Mother   . Cancer Father   . Breast cancer Neg Hx   . Kidney disease Neg Hx     SOCIAL HISTORY:   Social History  Substance Use Topics  . Smoking status: Former Smoker    Types: Cigarettes    Quit date: 07/20/2002  . Smokeless tobacco: Never Used     Comment: 01/22/2016   "  quit smoking many years ago "  . Alcohol use No    ALLERGIES:  is allergic to glipizide er and percocet [oxycodone-acetaminophen].  MEDICATIONS:  Current Outpatient Prescriptions  Medication Sig Dispense Refill  . ACCU-CHEK  AVIVA PLUS test strip 1 each by Other route daily. use as directed  3  . acetaminophen (TYLENOL) 500 MG tablet Take 500-1,000 mg by mouth every 6 (six) hours as needed (for pain/headache).     . Albuterol Sulfate (PROAIR RESPICLICK) 960 (90 Base) MCG/ACT AEPB Inhale 2 puffs into the lungs 4 (four) times daily as needed (wheezing).     Marland Kitchen atorvastatin (LIPITOR) 40 MG tablet Take 40 mg by mouth every evening.  5  . calcium acetate (PHOSLO) 667 MG capsule Take 2 capsules (1,334 mg total) by mouth 3 (three) times daily with meals. (Patient taking differently: Take 667 mg by mouth 2 (two) times daily. 1 capsule with breakfast & 1 capsule with lunch) 90 capsule 0  . cholecalciferol (VITAMIN D) 1000 units tablet Take 1,000 Units by mouth daily.    Marland Kitchen esomeprazole (NEXIUM) 40 MG capsule Take 40 mg by mouth daily after breakfast.     . ferrous sulfate 325 (65 FE) MG EC tablet Take 325 mg  by mouth daily after breakfast.     . furosemide (LASIX) 40 MG tablet Take 40 mg by mouth 2 (two) times daily.    Marland Kitchen gabapentin (NEURONTIN) 300 MG capsule Take 600 mg by mouth 3 (three) times daily.    Marland Kitchen lidocaine (XYLOCAINE) 5 % ointment Apply 1 application topically daily as needed for mild pain.     Marland Kitchen MYRBETRIQ 25 MG TB24 tablet TAKE 1 TABLET (25 MG TOTAL) BY MOUTH DAILY. (Patient taking differently: Take 25 mg by mouth daily after breakfast. ) 30 tablet 5  . NON FORMULARY Place 1 Units into the nose at bedtime. CPAP Time of use 2100-0600    . OXYGEN Inhale 2 L into the lungs at bedtime.    . TRAVATAN Z 0.004 % SOLN ophthalmic solution PLACE 1 DROP INTO BOTH EYES EVERY NIGHT AT BEDTIME  5  . traMADol (ULTRAM) 50 MG tablet Take 1 tablet (50 mg total) by mouth every 6 (six) hours as needed for moderate pain or severe pain. (Patient not taking: Reported on 03/12/2017) 40 tablet 0   No current facility-administered medications for this visit.     PHYSICAL EXAMINATION: ECOG PERFORMANCE STATUS: 2 - Symptomatic, <50% confined to  bed  BP (!) 125/54 (BP Location: Left Arm, Patient Position: Sitting)   Pulse 76   Temp 97.6 F (36.4 C) (Tympanic)   Resp 16   Wt 159 lb (72.1 kg)   BMI 24.18 kg/m   Filed Weights   03/12/17 1152  Weight: 159 lb (72.1 kg)    GENERAL: Well-nourished well-developed; Alert, no distress and comfortable. Using a cane for ambulation.  EYES: no pallor or icterus OROPHARYNX: no thrush or ulceration; good dentition  NECK: supple, no masses felt LYMPH:  no palpable lymphadenopathy in the cervical, axillary or inguinal regions LUNGS: clear to auscultation and  No wheeze or crackles HEART/CVS: regular rate & rhythm and no murmurs; No lower extremity edema ABDOMEN:abdomen soft, non-tender and normal bowel sounds Musculoskeletal:no cyanosis of digits and no clubbing  PSYCH: alert & oriented x 3 with fluent speech NEURO: no focal motor/sensory deficits SKIN:  no rashes or significant lesions  LABORATORY DATA:  I have reviewed the data as listed    Component Value Date/Time   NA 139 03/12/2017 1132   NA 138 11/04/2016 1453   NA 138 08/07/2012 0433   K 4.1 03/12/2017 1132   K 3.2 (L) 10/30/2014 1410   CL 106 03/12/2017 1132   CL 104 08/07/2012 0433   CO2 20 (L) 03/12/2017 1132   CO2 25 08/07/2012 0433   GLUCOSE 110 (H) 03/12/2017 1132   GLUCOSE 172 (H) 08/07/2012 0433   BUN 45 (H) 03/12/2017 1132   BUN 69 (H) 11/04/2016 1453   BUN 63 (H) 08/07/2012 0433   CREATININE 5.90 (H) 03/12/2017 1132   CREATININE 2.48 (H) 08/07/2012 0433   CALCIUM 8.2 (L) 03/12/2017 1132   CALCIUM 9.1 08/07/2012 0433   PROT 8.6 (H) 03/12/2017 1132   PROT 8.9 (H) 08/02/2012 0958   ALBUMIN 3.8 03/12/2017 1132   ALBUMIN 3.9 08/02/2012 0958   AST 20 03/12/2017 1132   AST 21 08/02/2012 0958   ALT 12 (L) 03/12/2017 1132   ALT 19 08/02/2012 0958   ALKPHOS 112 03/12/2017 1132   ALKPHOS 107 08/02/2012 0958   BILITOT 0.4 03/12/2017 1132   BILITOT 0.3 08/02/2012 0958   GFRNONAA 6 (L) 03/12/2017 1132    GFRNONAA 19 (L) 08/07/2012 0433   GFRAA 7 (L) 03/12/2017 1132  GFRAA 22 (L) 08/07/2012 0433    No results found for: SPEP, UPEP  Lab Results  Component Value Date   WBC 5.2 03/12/2017   NEUTROABS 3.4 03/12/2017   HGB 12.3 03/12/2017   HCT 37.9 03/12/2017   MCV 97.4 03/12/2017   PLT 141 (L) 03/12/2017      Chemistry      Component Value Date/Time   NA 139 03/12/2017 1132   NA 138 11/04/2016 1453   NA 138 08/07/2012 0433   K 4.1 03/12/2017 1132   K 3.2 (L) 10/30/2014 1410   CL 106 03/12/2017 1132   CL 104 08/07/2012 0433   CO2 20 (L) 03/12/2017 1132   CO2 25 08/07/2012 0433   BUN 45 (H) 03/12/2017 1132   BUN 69 (H) 11/04/2016 1453   BUN 63 (H) 08/07/2012 0433   CREATININE 5.90 (H) 03/12/2017 1132   CREATININE 2.48 (H) 08/07/2012 0433      Component Value Date/Time   CALCIUM 8.2 (L) 03/12/2017 1132   CALCIUM 9.1 08/07/2012 0433   ALKPHOS 112 03/12/2017 1132   ALKPHOS 107 08/02/2012 0958   AST 20 03/12/2017 1132   AST 21 08/02/2012 0958   ALT 12 (L) 03/12/2017 1132   ALT 19 08/02/2012 0958   BILITOT 0.4 03/12/2017 1132   BILITOT 0.3 08/02/2012 0958       RADIOGRAPHIC STUDIES: I have personally reviewed the radiological images as listed and agreed with the findings in the report. No results found.   ASSESSMENT & PLAN:  Cancer of trigone of urinary bladder ([place]) # muscle invasive bladder cancer/c T2/stage II- not a candidate for cystectomy given multiple comorbidities s/p RT and weekly gemx2. Discontinued due to poor tolerance 06/2016.   # Repeat cystoscopy July 2018 - neg for recurrence. Last PET scan 01/01/17 was negative for metastatic disease.  #anemia/hemoglobin 12.3 today. No need for transfusion today but will repeat labs in 3 months for consideration of transfusion.  #severe stage IV CKD- creatinine 5.9. Scheduled for hemodialysis tomorrow and remains on TTS schedule. Nephrology follows and Vascular monitors her accesses.   #  Patient continues to  have port in place. Will need to flush today and will plan to re-flush when she returns for labs in 3 months.   # follow up in 3 months with labs and see MD for further evaluation.    Orders Placed This Encounter  Procedures  . CBC with Differential    Standing Status:   Future    Standing Expiration Date:   03/12/2018  . Comprehensive metabolic panel    Standing Status:   Future    Standing Expiration Date:   03/12/2018   All questions were answered. The patient knows to call the clinic with any problems, questions or concerns.      Cammie Sickle, MD 03/14/2017 9:35 PM

## 2017-03-25 ENCOUNTER — Encounter (INDEPENDENT_AMBULATORY_CARE_PROVIDER_SITE_OTHER): Payer: Self-pay

## 2017-03-25 ENCOUNTER — Other Ambulatory Visit (INDEPENDENT_AMBULATORY_CARE_PROVIDER_SITE_OTHER): Payer: Self-pay

## 2017-03-30 ENCOUNTER — Encounter: Payer: Self-pay | Admitting: *Deleted

## 2017-03-30 ENCOUNTER — Encounter
Admission: RE | Disposition: A | Discharge status: Home / Self Care | Payer: Self-pay | Source: Ambulatory Visit | Attending: Vascular Surgery

## 2017-03-30 ENCOUNTER — Ambulatory Visit
Admission: RE | Admit: 2017-03-30 | Discharge: 2017-03-30 | Disposition: A | Discharge status: Home / Self Care | Payer: Medicare Other | Source: Ambulatory Visit | Attending: Vascular Surgery | Admitting: Vascular Surgery

## 2017-03-30 DIAGNOSIS — Z936 Other artificial openings of urinary tract status: Secondary | ICD-10-CM | POA: Insufficient documentation

## 2017-03-30 DIAGNOSIS — K900 Celiac disease: Secondary | ICD-10-CM | POA: Diagnosis not present

## 2017-03-30 DIAGNOSIS — Z885 Allergy status to narcotic agent status: Secondary | ICD-10-CM | POA: Diagnosis not present

## 2017-03-30 DIAGNOSIS — N186 End stage renal disease: Secondary | ICD-10-CM | POA: Diagnosis not present

## 2017-03-30 DIAGNOSIS — Z888 Allergy status to other drugs, medicaments and biological substances status: Secondary | ICD-10-CM | POA: Diagnosis not present

## 2017-03-30 DIAGNOSIS — E1140 Type 2 diabetes mellitus with diabetic neuropathy, unspecified: Secondary | ICD-10-CM | POA: Insufficient documentation

## 2017-03-30 DIAGNOSIS — G4730 Sleep apnea, unspecified: Secondary | ICD-10-CM | POA: Insufficient documentation

## 2017-03-30 DIAGNOSIS — Z809 Family history of malignant neoplasm, unspecified: Secondary | ICD-10-CM | POA: Diagnosis not present

## 2017-03-30 DIAGNOSIS — Z9889 Other specified postprocedural states: Secondary | ICD-10-CM | POA: Insufficient documentation

## 2017-03-30 DIAGNOSIS — I120 Hypertensive chronic kidney disease with stage 5 chronic kidney disease or end stage renal disease: Secondary | ICD-10-CM | POA: Diagnosis not present

## 2017-03-30 DIAGNOSIS — Z833 Family history of diabetes mellitus: Secondary | ICD-10-CM | POA: Insufficient documentation

## 2017-03-30 DIAGNOSIS — Z8551 Personal history of malignant neoplasm of bladder: Secondary | ICD-10-CM | POA: Diagnosis not present

## 2017-03-30 DIAGNOSIS — Z9221 Personal history of antineoplastic chemotherapy: Secondary | ICD-10-CM | POA: Insufficient documentation

## 2017-03-30 DIAGNOSIS — J449 Chronic obstructive pulmonary disease, unspecified: Secondary | ICD-10-CM | POA: Diagnosis not present

## 2017-03-30 DIAGNOSIS — M1990 Unspecified osteoarthritis, unspecified site: Secondary | ICD-10-CM | POA: Insufficient documentation

## 2017-03-30 DIAGNOSIS — Z9981 Dependence on supplemental oxygen: Secondary | ICD-10-CM | POA: Insufficient documentation

## 2017-03-30 DIAGNOSIS — Z90710 Acquired absence of both cervix and uterus: Secondary | ICD-10-CM | POA: Insufficient documentation

## 2017-03-30 DIAGNOSIS — Z452 Encounter for adjustment and management of vascular access device: Secondary | ICD-10-CM | POA: Insufficient documentation

## 2017-03-30 DIAGNOSIS — Z992 Dependence on renal dialysis: Secondary | ICD-10-CM | POA: Diagnosis not present

## 2017-03-30 DIAGNOSIS — I2510 Atherosclerotic heart disease of native coronary artery without angina pectoris: Secondary | ICD-10-CM | POA: Diagnosis not present

## 2017-03-30 DIAGNOSIS — Z87891 Personal history of nicotine dependence: Secondary | ICD-10-CM | POA: Diagnosis not present

## 2017-03-30 HISTORY — PX: DIALYSIS/PERMA CATHETER REMOVAL: CATH118289

## 2017-03-30 SURGERY — DIALYSIS/PERMA CATHETER REMOVAL
Anesthesia: Moderate Sedation

## 2017-03-30 MED ORDER — LIDOCAINE-EPINEPHRINE (PF) 1 %-1:200000 IJ SOLN
INTRAMUSCULAR | Status: DC | PRN
  Administered 2017-03-30: 10 mL

## 2017-03-30 SURGICAL SUPPLY — 4 items
SUT MNCRL 4-0 (SUTURE) ×2
SUT MNCRL 4-0 27XMFL (SUTURE) ×1
SUTURE MNCRL 4-0 27XMF (SUTURE) ×1 IMPLANT
TRAY LACERAT/PLASTIC (MISCELLANEOUS) ×3 IMPLANT

## 2017-03-30 NOTE — H&P (Signed)
[facility] Admission History & Physical  MRN : [id number]  Barbara [name] is a 76 y.o. (January 21, 1941) female who presents with chief complaint of working arm access with nonfunctioning tunneled catheter.  History of Present Illness: I am asked to evaluate the patient by the dialysis center. The patient was sent here because they have a nonfunctioning tunneled catheter and a functioning left brachial axillary graft.  The patient reports they're not been any problems with any of their dialysis runs. They are reporting good flows with good parameters at dialysis.  Patient denies pain or tenderness overlying the access.  There is no pain with dialysis.  The patient denies hand pain or finger pain consistent with steal syndrome.  No fevers or chills while on dialysis.   No current facility-administered medications for this encounter.     Past Medical History:  Diagnosis Date  . Adult celiac disease   . Anemia associated with chronic renal failure 2017   blood transfusion last week 10/17  . Arthritis   . Asthma   . Cancer ([place]) 2017   bladder  . Chronic kidney disease   . 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  dr. Candiss Norse and dr. Holley Raring follow her  . COPD (chronic obstructive pulmonary disease) ([place])   . Coronary artery disease   . Dialysis patient ([place])    Tues, Thurs, Sat  . Dyspnea    with exertion  . Elevated lipids   . GERD (gastroesophageal reflux disease)   . Hematuria   . Hypertension   . Lower back pain   . Neuropathy   . Oxygen dependent    at hs  . Personal history of chemotherapy   . Sleep apnea    uses cpap  . Urinary obstruction 01/2016    Past Surgical History:  Procedure Laterality Date  . ABDOMINAL HYSTERECTOMY    . AV FISTULA PLACEMENT Left 02/17/2017   Procedure: INSERTION OF ARTERIOVENOUS (AV) GORE-TEX GRAFT ARM(BRACHIAL AXILLARY);  Surgeon: Katha Cabal, MD;  Location: [facility] ORS;  Service: Vascular;   Laterality: Left;  . CYSTOSCOPY W/ RETROGRADES Bilateral 02/17/2016   Procedure: CYSTOSCOPY WITH RETROGRADE PYELOGRAM;  Surgeon: Hollice Espy, MD;  Location: [facility] ORS;  Service: Urology;  Laterality: Bilateral;  . CYSTOSCOPY W/ RETROGRADES Bilateral 10/12/2016   Procedure: CYSTOSCOPY WITH RETROGRADE PYELOGRAM;  Surgeon: Hollice Espy, MD;  Location: [facility] ORS;  Service: Urology;  Laterality: Bilateral;  . CYSTOSCOPY W/ URETERAL STENT PLACEMENT Left 05/12/2016   Procedure: CYSTOSCOPY WITH STENT REPLACEMENT;  Surgeon: Hollice Espy, MD;  Location: [facility] ORS;  Service: Urology;  Laterality: Left;  . CYSTOSCOPY W/ URETERAL STENT PLACEMENT Left 10/12/2016   Procedure: CYSTOSCOPY WITH STENT REPLACEMENT;  Surgeon: Hollice Espy, MD;  Location: [facility] ORS;  Service: Urology;  Laterality: Left;  . CYSTOSCOPY WITH STENT PLACEMENT Left 01/21/2016   Procedure: CYSTOSCOPY WITH double J STENT PLACEMENT;  Surgeon: Franchot Gallo, MD;  Location: [facility] ORS;  Service: Urology;  Laterality: Left;  . CYSTOSCOPY WITH STENT PLACEMENT Right 10/12/2016   Procedure: CYSTOSCOPY WITH STENT PLACEMENT;  Surgeon: Hollice Espy, MD;  Location: [facility] ORS;  Service: Urology;  Laterality: Right;  . DIALYSIS/PERMA CATHETER INSERTION N/A 11/20/2016   Procedure: Dialysis/Perma Catheter Insertion;  Surgeon: Algernon Huxley, MD;  Location: [facility];  Service: Cardiovascular;  Laterality: N/A;  . KIDNEY SURGERY  01/21/2016   IR NEPHROSTOMY PLACEMENT LEFT   . PERIPHERAL VASCULAR CATHETERIZATION N/A 04/07/2016   Procedure: Glori Luis Cath  Insertion;  Surgeon: Katha Cabal, MD;  Location: [place] [facility] LAB;  Service: Cardiovascular;  Laterality: N/A;  . PORTACATH PLACEMENT Right   . ROTATOR CUFF REPAIR Left   . TRANSURETHRAL RESECTION OF BLADDER TUMOR N/A 02/17/2016   Procedure: TRANSURETHRAL RESECTION OF BLADDER TUMOR (TURBT)-LARGE;  Surgeon: Hollice Espy, MD;  Location: [facility] ORS;  Service: Urology;  Laterality: N/A;  .  URETEROSCOPY Left 02/17/2016   Procedure: URETEROSCOPY;  Surgeon: Hollice Espy, MD;  Location: [facility] ORS;  Service: Urology;  Laterality: Left;  . URETEROSCOPY Right 10/12/2016   Procedure: URETEROSCOPY;  Surgeon: Hollice Espy, MD;  Location: [facility] ORS;  Service: Urology;  Laterality: Right;    Social History Social History  Substance Use Topics  . Smoking status: Former Smoker    Types: Cigarettes    Quit date: 07/20/2002  . Smokeless tobacco: Never Used     Comment: 01/22/2016   "  quit smoking many years ago "  . Alcohol use No    Family History Family History  Problem Relation Age of Onset  . Diabetes Mother   . Cancer Father   . Breast cancer Neg Hx   . Kidney disease Neg Hx     No family history of bleeding or clotting disorders, autoimmune disease or porphyria  Allergies  Allergen Reactions  . Glipizide Er     Dropped blood sugar too much  . Percocet [Oxycodone-Acetaminophen] Hives     REVIEW OF SYSTEMS (Negative unless checked)  Constitutional: [] Weight loss  [] Fever  [] Chills Cardiac: [] Chest pain   [] Chest pressure   [] Palpitations   [] Shortness of breath when laying flat   [] Shortness of breath at rest   [x] Shortness of breath with exertion. Vascular:  [] Pain in legs with walking   [] Pain in legs at rest   [] Pain in legs when laying flat   [] Claudication   [] Pain in feet when walking  [] Pain in feet at rest  [] Pain in feet when laying flat   [] History of DVT   [] Phlebitis   [] Swelling in legs   [] Varicose veins   [] Non-healing ulcers Pulmonary:   [] Uses home oxygen   [] Productive cough   [] Hemoptysis   [] Wheeze  [] COPD   [] Asthma Neurologic:  [] Dizziness  [] Blackouts   [] Seizures   [] History of stroke   [] History of TIA  [] Aphasia   [] Temporary blindness   [] Dysphagia   [] Weakness or numbness in arms   [] Weakness or numbness in legs Musculoskeletal:  [] Arthritis   [] Joint swelling   [] Joint pain   [] Low back pain Hematologic:  [] Easy bruising  [] Easy bleeding    [] Hypercoagulable state   [] Anemic  [] Hepatitis Gastrointestinal:  [] Blood in stool   [] Vomiting blood  [] Gastroesophageal reflux/heartburn   [] Difficulty swallowing. Genitourinary:  [x] Chronic kidney disease   [] Difficult urination  [] Frequent urination  [] Burning with urination   [] Blood in urine Skin:  [] Rashes   [] Ulcers   [] Wounds Psychological:  [] History of anxiety   []  History of major depression.  Physical Examination  Vitals:   03/30/17 1210  BP: 110/66  Pulse: 74  Resp: 14  Temp: 97.7 F (36.5 C)  TempSrc: Oral  SpO2: 95%   There is no height or weight on file to calculate BMI. Gen: WD/WN, NAD Head: [place]/AT, No temporalis wasting. Prominent temp pulse not noted. Ear/Nose/Throat: Hearing grossly intact, nares w/o erythema or drainage, oropharynx w/o Erythema/Exudate,  Eyes: Conjunctiva clear, sclera non-icteric Neck: Trachea midline.  No JVD.  Pulmonary:  Good air movement, respirations  not labored, no use of accessory muscles.  Cardiac: RRR, normal S1, S2. Vascular: left arm AV graft good thrill good bruit Vessel Right Left  Radial Palpable Palpable  Ulnar Not Palpable Not Palpable  Brachial Palpable Palpable  Carotid Palpable, without bruit Palpable, without bruit  Gastrointestinal: soft, non-tender/non-distended. No guarding/reflex.  Musculoskeletal: M/S 5/5 throughout.  Extremities without ischemic changes.  No deformity or atrophy.  Neurologic: Sensation grossly intact in extremities.  Symmetrical.  Speech is fluent. Motor exam as listed above. Psychiatric: Judgment intact, Mood & affect appropriate for pt's clinical situation. Dermatologic: No rashes or ulcers noted.  No cellulitis or open wounds. Lymph : No Cervical, Axillary, or Inguinal lymphadenopathy.   CBC Lab Results  Component Value Date   WBC 5.2 03/12/2017   HGB 12.3 03/12/2017   HCT 37.9 03/12/2017   MCV 97.4 03/12/2017   PLT 141 (L) 03/12/2017    BMET    Component Value Date/Time   NA 139  03/12/2017 1132   NA 138 11/04/2016 1453   NA 138 08/07/2012 0433   K 4.1 03/12/2017 1132   K 3.2 (L) 10/30/2014 1410   CL 106 03/12/2017 1132   CL 104 08/07/2012 0433   CO2 20 (L) 03/12/2017 1132   CO2 25 08/07/2012 0433   GLUCOSE 110 (H) 03/12/2017 1132   GLUCOSE 172 (H) 08/07/2012 0433   BUN 45 (H) 03/12/2017 1132   BUN 69 (H) 11/04/2016 1453   BUN 63 (H) 08/07/2012 0433   CREATININE 5.90 (H) 03/12/2017 1132   CREATININE 2.48 (H) 08/07/2012 0433   CALCIUM 8.2 (L) 03/12/2017 1132   CALCIUM 9.1 08/07/2012 0433   GFRNONAA 6 (L) 03/12/2017 1132   GFRNONAA 19 (L) 08/07/2012 0433   GFRAA 7 (L) 03/12/2017 1132   GFRAA 22 (L) 08/07/2012 0433   CrCl cannot be calculated (Unknown ideal weight.).  COAG Lab Results  Component Value Date   INR 1.08 02/08/2017   INR 1.23 01/25/2016   INR 1.38 01/24/2016    Radiology No results found.  Assessment/Plan 1.  Complication dialysis device with thrombosis AV access:  Patient's Tunneled catheter is malfunctioning. The patient has an extremity access that is functioning well. Therefore, the patient will undergo removal of the tunneled catheter under local anesthesia.  The risks and benefits were described to the patient.  All questions were answered.  The patient agrees to proceed with angiography and intervention. Potassium will be drawn to ensure that it is an appropriate level prior to performing intervention. 2.  End-stage renal disease requiring hemodialysis:  Patient will continue dialysis therapy without further interruption if a successful intervention is not achieved then a tunneled catheter will be placed. Dialysis has already been arranged. 3.  Hypertension:  Patient will continue medical management; nephrology is following no changes in oral medications. 4. Diabetes mellitus:  Glucose will be monitored and oral medications been held this morning once the patient has undergone the patient's procedure po intake will be reinitiated and  again Accu-Cheks will be used to assess the blood glucose level and treat as needed. The patient will be restarted on the patient's usual hypoglycemic regime    [name], MD  03/30/2017 1:36 PM

## 2017-03-30 NOTE — Op Note (Signed)
  OPERATIVE NOTE   PROCEDURE: 1. Removal of a left IJ tunneled dialysis catheter  PRE-OPERATIVE DIAGNOSIS: Complication of dialysis catheter  POST-OPERATIVE DIAGNOSIS: Same  SURGEON: [name]  ANESTHESIA: Local anesthetic with 1% lidocaine with epinephrine   ESTIMATED BLOOD LOSS: Minimal   FINDING(S): 1. Catheter intact   SPECIMEN(S):  Catheter  INDICATIONS:   Barbara [name] is a 76 y.o. female who presents with functioning arm access.  DESCRIPTION: After obtaining full informed written consent, the patient was positioned supine. The left IJ catheter and surrounding area is prepped and draped in a sterile fashion. The cuff was localized by palpation and noted to be greater than 3 cm from the exit site. After appropriate timeout is called, 1% lidocaine with epinephrine is infiltrated into the surrounding tissues around the cuff. Small transverse incision is created with an 11 blade scalpel and the dissection was carried down to expose the cuff of the tunneled catheter.  The catheter is then freed from the surrounding attachments and adhesions. Once the catheter has been freed circumferentially it is transected just distal to the cuff and subsequently removed in 2 pieces. Light pressure was held at the base of the neck. A 4-0 Monocryl was used close the tunnel in the subcutaneous space. The 4-0 Monocryl Monocryl was then used to close the skin in a subcuticular stitch. Dermabond is applied.  Antibiotic ointment and a sterile dressing is applied to the exit site. Patient tolerated procedure well and there were no complications.  COMPLICATIONS: None  CONDITION: Unchanged  [name]. [facility] Office: [phone]  03/30/2017,1:59 PM

## 2017-03-30 NOTE — Discharge Instructions (Signed)
Tunneled Catheter Removal, Care After Refer to this sheet in the next few weeks. These instructions provide you with information about caring for yourself after your procedure. Your health care provider may also give you more specific instructions. Your treatment has been planned according to current medical practices, but problems sometimes occur. Call your health care provider if you have any problems or questions after your procedure. What can I expect after the procedure? After the procedure, it is common to have:  Some mild redness, swelling, and pain around your catheter site.   Follow these instructions at home: Incision care   Check your removal site  every day for signs of infection. Check for:  More redness, swelling, or pain.  More fluid or blood.  Warmth.  Pus or a bad smell.  Follow instructions from your health care provider about how to take care of your removal site. Make sure you:  Wash your hands with soap and water before you change your bandages (dressings). If soap and water are not available, use hand sanitizer. Activity   Return to your normal activities as told by your health care provider. Ask your health care provider what activities are safe for you.  Do not lift anything that is heavier than 10 lb (4.5 kg) for 3 weeks or as long as told by your health care provider.  Contact a health care provider if:  You have more fluid or blood coming from your removal site  You have more redness, swelling, or pain at your incisions or around the area where your catheter was removed  Your removal site feel warm to the touch.  You feel unusually weak.  You feel nauseous..  Get help right away if  You have swelling in your arm, shoulder, neck, or face.  You develop chest pain.  You have difficulty breathing.  You feel dizzy or light-headed.  You have pus or a bad smell coming from your removal site  You have a fever.  You develop bleeding from your  removal site, and your bleeding does not stop. This information is not intended to replace advice given to you by your health care provider. Make sure you discuss any questions you have with your health care provider. Document Released: 06/22/2012 Document Revised: 03/08/2016 Document Reviewed: 04/01/2015 Elsevier Interactive Patient Education  2017 [employer].

## 2017-03-31 ENCOUNTER — Encounter: Payer: Self-pay | Admitting: Vascular Surgery

## 2017-05-11 ENCOUNTER — Emergency Department: Payer: Medicare Other

## 2017-05-11 ENCOUNTER — Encounter: Payer: Self-pay | Admitting: *Deleted

## 2017-05-11 ENCOUNTER — Emergency Department
Admission: EM | Admit: 2017-05-11 | Discharge: 2017-05-11 | Disposition: A | Discharge status: Home / Self Care | Payer: Medicare Other | Attending: Emergency Medicine | Admitting: Emergency Medicine

## 2017-05-11 DIAGNOSIS — R079 Chest pain, unspecified: Secondary | ICD-10-CM | POA: Diagnosis present

## 2017-05-11 DIAGNOSIS — R42 Dizziness and giddiness: Secondary | ICD-10-CM | POA: Insufficient documentation

## 2017-05-11 DIAGNOSIS — E1122 Type 2 diabetes mellitus with diabetic chronic kidney disease: Secondary | ICD-10-CM | POA: Diagnosis not present

## 2017-05-11 DIAGNOSIS — M79601 Pain in right arm: Secondary | ICD-10-CM | POA: Diagnosis not present

## 2017-05-11 DIAGNOSIS — Z87891 Personal history of nicotine dependence: Secondary | ICD-10-CM | POA: Diagnosis not present

## 2017-05-11 DIAGNOSIS — I129 Hypertensive chronic kidney disease with stage 1 through stage 4 chronic kidney disease, or unspecified chronic kidney disease: Secondary | ICD-10-CM | POA: Insufficient documentation

## 2017-05-11 DIAGNOSIS — N184 Chronic kidney disease, stage 4 (severe): Secondary | ICD-10-CM | POA: Insufficient documentation

## 2017-05-11 DIAGNOSIS — J449 Chronic obstructive pulmonary disease, unspecified: Secondary | ICD-10-CM | POA: Insufficient documentation

## 2017-05-11 DIAGNOSIS — I2510 Atherosclerotic heart disease of native coronary artery without angina pectoris: Secondary | ICD-10-CM | POA: Diagnosis not present

## 2017-05-11 DIAGNOSIS — Z992 Dependence on renal dialysis: Secondary | ICD-10-CM | POA: Insufficient documentation

## 2017-05-11 DIAGNOSIS — R51 Headache: Secondary | ICD-10-CM | POA: Insufficient documentation

## 2017-05-11 LAB — BASIC METABOLIC PANEL
ANION GAP: 12 (ref 5–15)
BUN: 77 mg/dL — ABNORMAL HIGH (ref 6–20)
CO2: 22 mmol/L (ref 22–32)
Calcium: 8.6 mg/dL — ABNORMAL LOW (ref 8.9–10.3)
Chloride: 105 mmol/L (ref 101–111)
Creatinine, Ser: 8.67 mg/dL — ABNORMAL HIGH (ref 0.44–1.00)
GFR, EST AFRICAN AMERICAN: 5 mL/min — AB (ref >60)
GFR, EST NON AFRICAN AMERICAN: 4 mL/min — AB (ref >60)
Glucose, Bld: 110 mg/dL — ABNORMAL HIGH (ref 65–99)
POTASSIUM: 4.3 mmol/L (ref 3.5–5.1)
SODIUM: 139 mmol/L (ref 135–145)

## 2017-05-11 LAB — TROPONIN I: Troponin I: <0.03 ng/mL (ref <0.03)

## 2017-05-11 LAB — CBC
HEMATOCRIT: 32.2 % — AB (ref 35.0–47.0)
HEMOGLOBIN: 10.2 g/dL — AB (ref 12.0–16.0)
MCH: 28.9 pg (ref 26.0–34.0)
MCHC: 31.5 g/dL — ABNORMAL LOW (ref 32.0–36.0)
MCV: 91.8 fL (ref 80.0–100.0)
Platelets: 117 10*3/uL — ABNORMAL LOW (ref 150–440)
RBC: 3.51 MIL/uL — AB (ref 3.80–5.20)
RDW: 16.5 % — ABNORMAL HIGH (ref 11.5–14.5)
WBC: 8.2 10*3/uL (ref 3.6–11.0)

## 2017-05-11 MED ORDER — HYDROCODONE-ACETAMINOPHEN 5-325 MG PO TABS
1.0000 | ORAL_TABLET | Freq: Once | ORAL | Status: AC
  Administered 2017-05-11: 1 via ORAL
  Filled 2017-05-11: qty 1

## 2017-05-11 MED ORDER — HEPARIN SOD (PORK) LOCK FLUSH 10 UNIT/ML IV SOLN
INTRAVENOUS | Status: AC
  Administered 2017-05-11: 50 [IU]
  Filled 2017-05-11: qty 1

## 2017-05-11 MED ORDER — TRAMADOL HCL 50 MG PO TABS: 50.0000 mg | ORAL_TABLET | Freq: Two times a day (BID) | ORAL | 0 refills | Status: DC | PRN

## 2017-05-11 NOTE — ED Triage Notes (Signed)
Pt complains of severe chest pain last night, pt reports chest is sore today, pt complains of right arm pain, pt reports an episode a dizziness last night

## 2017-05-11 NOTE — ED Provider Notes (Signed)
[facility] Emergency Department Provider Note       Time seen: ----------------------------------------- 9:33 AM on 05/11/2017 -----------------------------------------     I have reviewed the triage vital signs and the nursing notes.   HISTORY   Chief Complaint Chest Pain    HPI Barbara [name] is a 76 y.o. female with a history of dialysis and COPD who presents to the ED for chest pain. Patient describes worse pain around dark last night particularly involving the upper chest and she states radiated into her head. She was having severe chest pain and states the chest is sore today. She currently complains of right arm pain and had an episode of dizziness last night. She has never had this happen before, nothing makes it better or worse.  Past Medical History:  Diagnosis Date  . Adult celiac disease   . Anemia associated with chronic renal failure 2017   blood transfusion last week 10/17  . Arthritis   . Asthma   . Cancer ([place]) 2017   bladder  . Chronic kidney disease   . 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  dr. Candiss Norse and dr. Holley Raring follow her  . COPD (chronic obstructive pulmonary disease) ([place])   . Coronary artery disease   . Dialysis patient ([place])    Tues, Thurs, Sat  . Dyspnea    with exertion  . Elevated lipids   . GERD (gastroesophageal reflux disease)   . Hematuria   . Hypertension   . Lower back pain   . Neuropathy   . Oxygen dependent    at hs  . Personal history of chemotherapy   . Sleep apnea    uses cpap  . Urinary obstruction 01/2016    Patient Active Problem List   Diagnosis Date Noted  . Presence of cardiac and vascular implant and graft   . Fever   . Sepsis ([place])   . 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 Sepsis secondary to UTI ([place]) 01/24/2017  . Acute kidney injury ([place]) 11/19/2016  . Anemia, chronic renal failure, stage 4 (severe) ([place]) 09/17/2016  . Hypoglycemia 06/04/2016  .  Hyperkalemia 05/12/2016  . Protein-calorie malnutrition, severe 05/05/2016  . Acute on chronic renal failure ([place]) 05/04/2016  . Seizure ([place]) 04/17/2016  . Palliative care by specialist   . DNR (do not resuscitate) discussion   . C. difficile diarrhea 03/11/2016  . Anemia 03/11/2016  . Hypotension 03/11/2016  . Acidosis 03/11/2016  . Failure to thrive (child) 03/11/2016  . Weakness generalized 03/11/2016  . ARF (acute renal failure) ([place]) 03/04/2016  . Cancer of trigone of urinary bladder ([place]) 03/02/2016  . Absolute anemia 02/04/2016  . Airway hyperreactivity 02/04/2016  . Celiac disease 02/04/2016  . Gastric catarrh 02/04/2016  . Acid reflux 02/04/2016  . Combined fat and carbohydrate induced hyperlipemia 02/04/2016  . C. difficile colitis 01/22/2016  . Urinary obstruction 01/21/2016  . COPD (chronic obstructive pulmonary disease) ([place]) 01/21/2016  . Controlled type 2 diabetes mellitus with stage 4 chronic kidney disease, with long-term current use of insulin ([place]) 01/21/2016  . Essential hypertension 01/21/2016  . Acute renal failure superimposed on stage 4 chronic kidney disease ([place]) 01/20/2016  . Anemia in chronic kidney disease 01/20/2016  . Arthritis of knee, degenerative 05/10/2015  . Knee strain 03/26/2015  . Other intervertebral disc displacement, lumbar region 03/04/2015  . Degenerative arthritis of lumbar spine 03/04/2015  . HNP (herniated nucleus pulposus), lumbar 03/04/2015  . Osteoarthritis  of spine with radiculopathy, lumbar region 03/04/2015  . Injury of tendon of upper extremity 11/12/2014  . Atherosclerosis of abdominal aorta ([place]) 11/02/2014  . Chronic kidney disease, stage IV (severe) ([place]) 11/02/2014  . Obstructive apnea 11/02/2014  . Complete rotator cuff rupture of left shoulder 10/05/2014  . Arthritis of shoulder region, degenerative 10/05/2014  . CAD in native artery 12/08/2013  . Benign essential HTN 12/08/2013  . Type 2 diabetes mellitus ([place])  12/08/2013    Past Surgical History:  Procedure Laterality Date  . ABDOMINAL HYSTERECTOMY    . AV FISTULA PLACEMENT Left 02/17/2017   Procedure: INSERTION OF ARTERIOVENOUS (AV) GORE-TEX GRAFT ARM(BRACHIAL AXILLARY);  Surgeon: Katha Cabal, MD;  Location: [facility];  Service: Vascular;  Laterality: Left;  . CYSTOSCOPY W/ RETROGRADES Bilateral 02/17/2016   Procedure: CYSTOSCOPY WITH RETROGRADE PYELOGRAM;  Surgeon: Hollice Espy, MD;  Location: [facility];  Service: Urology;  Laterality: Bilateral;  . CYSTOSCOPY W/ RETROGRADES Bilateral 10/12/2016   Procedure: CYSTOSCOPY WITH RETROGRADE PYELOGRAM;  Surgeon: Hollice Espy, MD;  Location: [facility];  Service: Urology;  Laterality: Bilateral;  . CYSTOSCOPY W/ URETERAL STENT PLACEMENT Left 05/12/2016   Procedure: CYSTOSCOPY WITH STENT REPLACEMENT;  Surgeon: Hollice Espy, MD;  Location: [facility];  Service: Urology;  Laterality: Left;  . CYSTOSCOPY W/ URETERAL STENT PLACEMENT Left 10/12/2016   Procedure: CYSTOSCOPY WITH STENT REPLACEMENT;  Surgeon: Hollice Espy, MD;  Location: [facility];  Service: Urology;  Laterality: Left;  . CYSTOSCOPY WITH STENT PLACEMENT Left 01/21/2016   Procedure: CYSTOSCOPY WITH double J STENT PLACEMENT;  Surgeon: Franchot Gallo, MD;  Location: [facility];  Service: Urology;  Laterality: Left;  . CYSTOSCOPY WITH STENT PLACEMENT Right 10/12/2016   Procedure: CYSTOSCOPY WITH STENT PLACEMENT;  Surgeon: Hollice Espy, MD;  Location: [facility];  Service: Urology;  Laterality: Right;  . DIALYSIS/PERMA CATHETER INSERTION N/A 11/20/2016   Procedure: Dialysis/Perma Catheter Insertion;  Surgeon: Algernon Huxley, MD;  Location: [place] [facility];  Service: Cardiovascular;  Laterality: N/A;  . DIALYSIS/PERMA CATHETER REMOVAL N/A 03/30/2017   Procedure: DIALYSIS/PERMA CATHETER REMOVAL;  Surgeon: Katha Cabal, MD;  Location: [place] [facility];  Service: Cardiovascular;  Laterality: N/A;  . KIDNEY SURGERY  01/21/2016   IR NEPHROSTOMY  PLACEMENT LEFT   . PERIPHERAL VASCULAR CATHETERIZATION N/A 04/07/2016   Procedure: Glori Luis Cath Insertion;  Surgeon: Katha Cabal, MD;  Location: [place] [facility];  Service: Cardiovascular;  Laterality: N/A;  . PORTACATH PLACEMENT Right   . ROTATOR CUFF REPAIR Left   . TRANSURETHRAL RESECTION OF BLADDER TUMOR N/A 02/17/2016   Procedure: TRANSURETHRAL RESECTION OF BLADDER TUMOR (TURBT)-LARGE;  Surgeon: Hollice Espy, MD;  Location: [facility];  Service: Urology;  Laterality: N/A;  . URETEROSCOPY Left 02/17/2016   Procedure: URETEROSCOPY;  Surgeon: Hollice Espy, MD;  Location: [facility];  Service: Urology;  Laterality: Left;  . URETEROSCOPY Right 10/12/2016   Procedure: URETEROSCOPY;  Surgeon: Hollice Espy, MD;  Location: [facility];  Service: Urology;  Laterality: Right;    Allergies Glipizide er and Percocet [oxycodone-acetaminophen]  Social History Social History  Substance Use Topics  . Smoking status: Former Smoker    Types: Cigarettes    Quit date: 07/20/2002  . Smokeless tobacco: Never Used     Comment: 01/22/2016   "  quit smoking many years ago "  . Alcohol use No    Review of Systems Constitutional: Negative for fever. Eyes: Negative for vision changes ENT:  Negative for congestion, sore throat Cardiovascular: Positive for chest  pain Respiratory: Negative for shortness of breath. Gastrointestinal: Negative for abdominal pain, vomiting and diarrhea. Genitourinary: Negative for dysuria. Musculoskeletal: Positive for right arm pain. Skin: Negative for rash. Neurological: Positive for recent headache and dizziness  All systems negative/normal/unremarkable except as stated in the HPI  ____________________________________________   PHYSICAL EXAM:  VITAL SIGNS: ED Triage Vitals  Enc Vitals Group     BP 05/11/17 0920 (!) 130/52     Pulse Rate 05/11/17 0920 80     Resp 05/11/17 0920 18     Temp 05/11/17 0920 99 F (37.2 C)     Temp Source 05/11/17 0920 Oral      SpO2 05/11/17 0920 96 %     Weight 05/11/17 0911 162 lb (73.5 kg)     Height 05/11/17 0911 5' 8"  (1.727 m)     Head Circumference --      Peak Flow --      Pain Score --      Pain Loc --      Pain Edu? --      Excl. in [place]? --    Constitutional: Alert and oriented. Mild distress Eyes: Conjunctivae are normal. Normal extraocular movements. ENT   Head: Normocephalic and atraumatic.   Nose: No congestion/rhinnorhea.   Mouth/Throat: Mucous membranes are moist.   Neck: No stridor. Cardiovascular: Normal rate, regular rhythm. No murmurs, rubs, or gallops. Respiratory: Normal respiratory effort without tachypnea nor retractions. Breath sounds are clear and equal bilaterally. No wheezes/rales/rhonchi. Gastrointestinal: Soft and nontender. Normal bowel sounds Musculoskeletal: Nontender with normal range of motion in extremities. No lower extremity tenderness nor edema. Neurologic:  Normal speech and language. No gross focal neurologic deficits are appreciated.  Skin:  Skin is warm, dry and intact. No rash noted. Psychiatric: Mood and affect are normal. Speech and behavior are normal.  ____________________________________________  EKG: Interpreted by me. Sinus rhythm rate 80 bpm, prolonged PR interval, normal QRS size, nonspecific ST segment changes with likely early repolarization. No reciprocal changes. Normal QT.  ____________________________________________  ED COURSE:  Pertinent labs & imaging results that were available during my care of the patient were reviewed by me and considered in my medical decision making (see chart for details). Patient presents for chest pain, we will assess with labs and imaging as indicated.   Procedures ____________________________________________   LABS (pertinent positives/negatives)  Labs Reviewed  BASIC METABOLIC PANEL - Abnormal; Notable for the following:       Result Value   Glucose, Bld 110 (*)    BUN 77 (*)    Creatinine, Ser  8.67 (*)    Calcium 8.6 (*)    GFR calc non Af Amer 4 (*)    GFR calc Af Amer 5 (*)    All other components within normal limits  CBC - Abnormal; Notable for the following:    RBC 3.51 (*)    Hemoglobin 10.2 (*)    HCT 32.2 (*)    MCHC 31.5 (*)    RDW 16.5 (*)    Platelets 117 (*)    All other components within normal limits  TROPONIN I  TROPONIN I    RADIOLOGY  Chest x-ray IMPRESSION: COPD. Bibasilar atelectasis or scarring. IMPRESSION: No acute intracranial abnormality.  Atrophy, chronic microvascular disease. ____________________________________________  DIFFERENTIAL DIAGNOSIS   Unstable angina, musculoskeletal pain, PE, pneumothorax, dissection, pericardial effusion   FINAL ASSESSMENT AND PLAN  Chest pain   Plan: Patient had presented for chest pain with headache and dizziness. Patients labs were negative  including repeat troponin after 3 hours. Patients imaging was negative for acute intrathoracic process or intracranial abnormality. Unclear etiology for chest pain at this time. She is stable for outpatient follow-up with dialysis tomorrow.   Earleen Newport, MD   Note: This note was generated in part or whole with voice recognition software. Voice recognition is usually quite accurate but there are transcription errors that can and very often do occur. I apologize for any typographical errors that were not detected and corrected.     Earleen Newport, MD 05/11/17 1351

## 2017-05-11 NOTE — ED Notes (Signed)
[facility] RN aware of placement in Room 15.

## 2017-05-11 NOTE — ED Notes (Signed)
Pt presents with chest pain that radiates from central chest to neck and right arm since last night. Pt reports that she did not come last night because she didn't want her husband driving at night and they couldn't get a ride. Pt is dialysis pt T/TH/S; due today. Pt alert & oriented with NAD noted.

## 2017-05-11 NOTE — ED Notes (Signed)
Pt discharged home after verbalizing understanding of discharge instructions; nad noted. 

## 2017-05-11 NOTE — ED Notes (Signed)
Pt assisted to toilet 

## 2017-05-12 ENCOUNTER — Encounter: Payer: Self-pay | Admitting: Urology

## 2017-05-12 ENCOUNTER — Ambulatory Visit (INDEPENDENT_AMBULATORY_CARE_PROVIDER_SITE_OTHER): Payer: Medicare Other | Admitting: Urology

## 2017-05-12 VITALS — BP 116/45 | HR 83 | Ht 68.0 in | Wt 172.0 lb

## 2017-05-12 DIAGNOSIS — N329 Bladder disorder, unspecified: Secondary | ICD-10-CM

## 2017-05-12 DIAGNOSIS — C678 Malignant neoplasm of overlapping sites of bladder: Secondary | ICD-10-CM | POA: Diagnosis not present

## 2017-05-12 LAB — MICROSCOPIC EXAMINATION: Bacteria, UA: NONE SEEN

## 2017-05-12 LAB — URINALYSIS, COMPLETE
Bilirubin, UA: NEGATIVE
GLUCOSE, UA: NEGATIVE
KETONES UA: NEGATIVE
Leukocytes, UA: NEGATIVE
NITRITE UA: NEGATIVE
SPEC GRAV UA: 1.015 (ref 1.005–1.030)
UUROB: 0.2 mg/dL (ref 0.2–1.0)
pH, UA: 6 (ref 5.0–7.5)

## 2017-05-12 MED ORDER — CIPROFLOXACIN HCL 500 MG PO TABS
500.0000 mg | ORAL_TABLET | Freq: Once | ORAL | Status: AC
  Administered 2017-05-12: 500 mg via ORAL

## 2017-05-12 MED ORDER — LIDOCAINE HCL 2 % EX GEL
1.0000 "application " | Freq: Once | CUTANEOUS | Status: AC
  Administered 2017-05-12: 1 via URETHRAL

## 2017-05-12 NOTE — Progress Notes (Signed)
   05/12/17  CC:  Chief Complaint  Patient presents with  . Cysto    35month   HPI: 76year old female with history of muscle invasive bladder cancer diagnosed in 01/2015 status post TURBT followed by palliative gemcitabine and concurrent radiation.    She also underwent recent CT PET scan on 01/01/2017 which shows marked bladder wall thickening and bilateral hydronephrosis despite ureteral stents in place. There is no evidence of metastatic disease. She is being followed closely by the cancer center as well.  She was last taken to the operating room on 10/12/2016 at which time the bladder was noted to be diffusely erythematous, small in capacity with newly identified right hydroureteronephrosis down to level of the distal ureter with a negative ureteroscopy. This possibly related to radiation changes of the distal ureter. Bilateral ureteral stents replaced at that time.  In the interim, she's been started on hemodialysis. 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  Today, she reports that she is voiding significantly less frequently.  She does have occasional dysuria when voiding.  She now only voids approximately once every other day.  Blood pressure 129/62, pulse 84, height 5' 8"  (1.727 m), weight 161 lb 14.4 oz (73.4 kg). NED. A&Ox3.   No respiratory distress   Abd soft, NT, ND Normal external genitalia with patent urethral meatus  Cystoscopy Procedure Note  Patient identification was confirmed, informed consent was obtained, and patient was prepped using Betadine solution.  Lidocaine jelly was administered per urethral meatus.    Preoperative abx where received prior to procedure.    Procedure: - Flexible cystoscope introduced, without any difficulty.   - Thorough search of the bladder revealed:    Pipe like urethral meatus Mildly trabeculated bladder Multiple diffuse patches of bladder  erythema, somewhat hyperpigmented sessile appearing lesions without any obvious papillary change, concerning for underlying pathology Bilateral ureteral orifices identified and capacious.  Post-Procedure: - Cystoscopy well tolerated today  Assessment/ Plan:  1.  History of bladder cancer/bladder lesions Cystoscopy today now with focal discrete hyperpigmented lesions, plaque-like in appearance in some areas concerning for pathology versus radiation cystitis I recommended proceeding to the operating room for cystoscopy, biopsy, bilateral retrograde pyelogram She is agreeable with this plan- given her recent episode of chest pain, will recommend cardiac clearance - Urinalysis, Complete - ciprofloxacin (CIPRO) tablet 500 mg; Take 1 tablet (500 mg total) by mouth once. - lidocaine (XYLOCAINE) 2 % jelly 1 application; Place 1 application into the urethra once. - Microscopic Examination  2. Hydronephrosis Likely related to bilateral radiation changes of the ureter/bladder No further ureteral stenting given progression of end-stage renal disease now on hemodialysis  Schedule bladder biopsy, bilateral retrograde pyelogram  AHollice Espy MD

## 2017-05-12 NOTE — H&P (View-Only) (Signed)
   05/12/17  CC:  Chief Complaint  Patient presents with  . Cysto    38month   HPI: 76year old female with history of muscle invasive bladder cancer diagnosed in 01/2015 status post TURBT followed by palliative gemcitabine and concurrent radiation.    She also underwent recent CT PET scan on 01/01/2017 which shows marked bladder wall thickening and bilateral hydronephrosis despite ureteral stents in place. There is no evidence of metastatic disease. She is being followed closely by the cancer center as well.  She was last taken to the operating room on 10/12/2016 at which time the bladder was noted to be diffusely erythematous, small in capacity with newly identified right hydroureteronephrosis down to level of the distal ureter with a negative ureteroscopy. This possibly related to radiation changes of the distal ureter. Bilateral ureteral stents replaced at that time.  In the interim, she's been started on hemodialysis. 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  Today, she reports that she is voiding significantly less frequently.  She does have occasional dysuria when voiding.  She now only voids approximately once every other day.  Blood pressure 129/62, pulse 84, height 5' 8"  (1.727 m), weight 161 lb 14.4 oz (73.4 kg). NED. A&Ox3.   No respiratory distress   Abd soft, NT, ND Normal external genitalia with patent urethral meatus  Cystoscopy Procedure Note  Patient identification was confirmed, informed consent was obtained, and patient was prepped using Betadine solution.  Lidocaine jelly was administered per urethral meatus.    Preoperative abx where received prior to procedure.    Procedure: - Flexible cystoscope introduced, without any difficulty.   - Thorough search of the bladder revealed:    Pipe like urethral meatus Mildly trabeculated bladder Multiple diffuse patches of bladder  erythema, somewhat hyperpigmented sessile appearing lesions without any obvious papillary change, concerning for underlying pathology Bilateral ureteral orifices identified and capacious.  Post-Procedure: - Cystoscopy well tolerated today  Assessment/ Plan:  1.  History of bladder cancer/bladder lesions Cystoscopy today now with focal discrete hyperpigmented lesions, plaque-like in appearance in some areas concerning for pathology versus radiation cystitis I recommended proceeding to the operating room for cystoscopy, biopsy, bilateral retrograde pyelogram She is agreeable with this plan- given her recent episode of chest pain, will recommend cardiac clearance - Urinalysis, Complete - ciprofloxacin (CIPRO) tablet 500 mg; Take 1 tablet (500 mg total) by mouth once. - lidocaine (XYLOCAINE) 2 % jelly 1 application; Place 1 application into the urethra once. - Microscopic Examination  2. Hydronephrosis Likely related to bilateral radiation changes of the ureter/bladder No further ureteral stenting given progression of end-stage renal disease now on hemodialysis  Schedule bladder biopsy, bilateral retrograde pyelogram  AHollice Espy MD

## 2017-05-14 ENCOUNTER — Telehealth: Payer: Self-pay

## 2017-05-14 NOTE — Telephone Encounter (Signed)
Patient notified of surgery date and pre admit date and time. The patient is scheduled for surgery with Dr Erlene Quan at [facility] on 05/31/17. She will pre admit at the hospital on 05/25/17 at 11:30 am. A surgery packet has been mailed to the patient. The patient is aware of dates, time, and instructions.  We are awaiting cardiac clearance for the patient from Dr Alveria Apley office.

## 2017-05-16 LAB — CULTURE, URINE COMPREHENSIVE

## 2017-05-18 ENCOUNTER — Telehealth: Payer: Self-pay | Admitting: Family Medicine

## 2017-05-18 NOTE — Telephone Encounter (Signed)
Patient notified and voiced understanding.

## 2017-05-18 NOTE — Telephone Encounter (Signed)
-----   Message from Hollice Espy, MD sent at 05/18/2017  8:57 AM EDT ----- Very low colony count of skin flora.  Likely not infection.  Will defer treatment given asymptomatic.    Hollice Espy, MD

## 2017-05-25 ENCOUNTER — Encounter
Admission: RE | Admit: 2017-05-25 | Discharge: 2017-05-25 | Disposition: A | Discharge status: Home / Self Care | Payer: Medicare Other | Source: Ambulatory Visit | Attending: Urology | Admitting: Urology

## 2017-05-25 ENCOUNTER — Other Ambulatory Visit: Payer: Self-pay

## 2017-05-25 DIAGNOSIS — Z01818 Encounter for other preprocedural examination: Secondary | ICD-10-CM | POA: Insufficient documentation

## 2017-05-25 NOTE — Patient Instructions (Signed)
Your procedure is scheduled on: Monday 05/31/17 Report to [place]. 2ND FLOOR MEDICAL MALL ENTRANCE. To find out your arrival time please call [phone] between 1PM - 3PM on Friday 05/28/17.  Remember: Instructions that are not followed completely may result in serious medical risk, up to and including death, or upon the discretion of your surgeon and anesthesiologist your surgery may need to be rescheduled.    __X__ 1. Do not eat anything after midnight the night before your    procedure.  No gum chewing or hard candies.  You may drink clear   liquids up to 2 hours before you are scheduled to arrive at the   hospital for your procedure. Do not drink clear liquids within 2   hours of scheduled arrival to the hospital as this may lead to your   procedure being delayed or rescheduled.       Clear liquids include:   Water or Apple juice without pulp   Clear carbohydrate beverage such as Clearfast or Gatorade   Black coffee or Clear Tea (no milk, no creamer, do not add anything   to the coffee or tea)    Diabetics should only drink water   __X__ 2. No Alcohol for 24 hours before or after surgery.   ____ 3. Bring all medications with you on the day of surgery if instructed.    __X__ 4. Notify your doctor if there is any change in your medical condition     (cold, fever, infections).             __X___5. No smoking within 24 hours of your surgery.     Do not wear jewelry, make-up, hairpins, clips or nail polish.  Do not wear lotions, powders, or perfumes.   Do not shave 48 hours prior to surgery. Men may shave face and neck.  Do not bring valuables to the hospital.    [facility] is not responsible for any belongings or valuables.               Contacts, dentures or bridgework may not be worn into surgery.  Leave your suitcase in the car. After surgery it may be brought to your room.  For patients admitted to the hospital, discharge time is determined by your                treatment  team.   Patients discharged the day of surgery will not be allowed to drive home.   Please read over the following fact sheets that you were given:   MRSA Information   __X__ Take these medicines the morning of surgery with A SIP OF WATER:    1. [place]  2. GABAPENTIN  3. OXYBUTININ  4.  5.  6.  ____ Fleet Enema (as directed)   ____ Use CHG Soap/SAGE wipes as directed  __X__ Use inhalers on the day of surgery  ____ Stop metformin 2 days prior to surgery    ____ Take 1/2 of usual insulin dose the night before surgery and none on the morning of surgery.   ____ Stop Coumadin/Plavix/aspirin on   __X__ Stop Anti-inflammatories such as Advil, Aleve, Ibuprofen, Motrin, Naproxen, Naprosyn, Goodies,powder, or aspirin products.  OK to take Tylenol.   __X__ Stop supplements, Vitamin E, Fish Oil until after surgery.    ____ Bring C-Pap to the hospital.

## 2017-05-26 ENCOUNTER — Telehealth: Payer: Self-pay | Admitting: Radiology

## 2017-05-26 NOTE — Telephone Encounter (Signed)
Contacted Dr Alveria Apley office regarding surgical clearance. Pt missed clearance appt on 05/24/17. Advised pt to reschedule missed appt so clearance could be obtained prior to surgery. Pt states she has dialysis on 11/8 & 05/28/17 so unable to make appt prior to surgery scheduled 05/31/17. Pt requests surgery be rescheduled to 06/09/17 in order to reschedule clearance appt. Questions answered. Pt voices understanding.

## 2017-06-01 ENCOUNTER — Other Ambulatory Visit (INDEPENDENT_AMBULATORY_CARE_PROVIDER_SITE_OTHER): Payer: Self-pay | Admitting: Vascular Surgery

## 2017-06-01 MED ORDER — CEFAZOLIN SODIUM-DEXTROSE 2-4 GM/100ML-% IV SOLN
2.0000 g | Freq: Once | INTRAVENOUS | Status: AC
  Administered 2017-06-02: 2 g via INTRAVENOUS

## 2017-06-01 NOTE — Pre-Procedure Instructions (Signed)
CLEARED LOW RISK BY DR Nehemiah Massed 05/31/17

## 2017-06-02 ENCOUNTER — Encounter: Payer: Self-pay | Admitting: Emergency Medicine

## 2017-06-02 ENCOUNTER — Encounter
Admission: RE | Disposition: A | Discharge status: Home / Self Care | Payer: Self-pay | Source: Ambulatory Visit | Attending: Vascular Surgery

## 2017-06-02 ENCOUNTER — Ambulatory Visit
Admission: RE | Admit: 2017-06-02 | Discharge: 2017-06-02 | Disposition: A | Discharge status: Home / Self Care | Payer: Medicare Other | Source: Ambulatory Visit | Attending: Vascular Surgery | Admitting: Vascular Surgery

## 2017-06-02 DIAGNOSIS — Z9221 Personal history of antineoplastic chemotherapy: Secondary | ICD-10-CM | POA: Diagnosis not present

## 2017-06-02 DIAGNOSIS — Z936 Other artificial openings of urinary tract status: Secondary | ICD-10-CM | POA: Insufficient documentation

## 2017-06-02 DIAGNOSIS — Z992 Dependence on renal dialysis: Secondary | ICD-10-CM | POA: Insufficient documentation

## 2017-06-02 DIAGNOSIS — E1140 Type 2 diabetes mellitus with diabetic neuropathy, unspecified: Secondary | ICD-10-CM | POA: Insufficient documentation

## 2017-06-02 DIAGNOSIS — N186 End stage renal disease: Secondary | ICD-10-CM | POA: Diagnosis not present

## 2017-06-02 DIAGNOSIS — K900 Celiac disease: Secondary | ICD-10-CM | POA: Insufficient documentation

## 2017-06-02 DIAGNOSIS — Z87891 Personal history of nicotine dependence: Secondary | ICD-10-CM | POA: Diagnosis not present

## 2017-06-02 DIAGNOSIS — I120 Hypertensive chronic kidney disease with stage 5 chronic kidney disease or end stage renal disease: Secondary | ICD-10-CM | POA: Insufficient documentation

## 2017-06-02 DIAGNOSIS — J449 Chronic obstructive pulmonary disease, unspecified: Secondary | ICD-10-CM | POA: Insufficient documentation

## 2017-06-02 DIAGNOSIS — I2510 Atherosclerotic heart disease of native coronary artery without angina pectoris: Secondary | ICD-10-CM | POA: Insufficient documentation

## 2017-06-02 DIAGNOSIS — Z833 Family history of diabetes mellitus: Secondary | ICD-10-CM | POA: Insufficient documentation

## 2017-06-02 DIAGNOSIS — E1151 Type 2 diabetes mellitus with diabetic peripheral angiopathy without gangrene: Secondary | ICD-10-CM | POA: Diagnosis not present

## 2017-06-02 DIAGNOSIS — Z809 Family history of malignant neoplasm, unspecified: Secondary | ICD-10-CM | POA: Insufficient documentation

## 2017-06-02 DIAGNOSIS — Z885 Allergy status to narcotic agent status: Secondary | ICD-10-CM | POA: Insufficient documentation

## 2017-06-02 DIAGNOSIS — D631 Anemia in chronic kidney disease: Secondary | ICD-10-CM | POA: Diagnosis not present

## 2017-06-02 DIAGNOSIS — M1990 Unspecified osteoarthritis, unspecified site: Secondary | ICD-10-CM | POA: Diagnosis not present

## 2017-06-02 DIAGNOSIS — Z9981 Dependence on supplemental oxygen: Secondary | ICD-10-CM | POA: Insufficient documentation

## 2017-06-02 DIAGNOSIS — Z90710 Acquired absence of both cervix and uterus: Secondary | ICD-10-CM | POA: Insufficient documentation

## 2017-06-02 DIAGNOSIS — T82868A Thrombosis of vascular prosthetic devices, implants and grafts, initial encounter: Secondary | ICD-10-CM | POA: Insufficient documentation

## 2017-06-02 DIAGNOSIS — Z9889 Other specified postprocedural states: Secondary | ICD-10-CM | POA: Diagnosis not present

## 2017-06-02 DIAGNOSIS — G4730 Sleep apnea, unspecified: Secondary | ICD-10-CM | POA: Diagnosis not present

## 2017-06-02 DIAGNOSIS — E1122 Type 2 diabetes mellitus with diabetic chronic kidney disease: Secondary | ICD-10-CM | POA: Insufficient documentation

## 2017-06-02 DIAGNOSIS — Y832 Surgical operation with anastomosis, bypass or graft as the cause of abnormal reaction of the patient, or of later complication, without mention of misadventure at the time of the procedure: Secondary | ICD-10-CM | POA: Diagnosis not present

## 2017-06-02 DIAGNOSIS — Z8551 Personal history of malignant neoplasm of bladder: Secondary | ICD-10-CM | POA: Insufficient documentation

## 2017-06-02 DIAGNOSIS — Z888 Allergy status to other drugs, medicaments and biological substances status: Secondary | ICD-10-CM | POA: Diagnosis not present

## 2017-06-02 DIAGNOSIS — K219 Gastro-esophageal reflux disease without esophagitis: Secondary | ICD-10-CM | POA: Insufficient documentation

## 2017-06-02 HISTORY — PX: PERIPHERAL VASCULAR THROMBECTOMY: CATH118306

## 2017-06-02 LAB — GLUCOSE, CAPILLARY: Glucose-Capillary: 82 mg/dL (ref 65–99)

## 2017-06-02 LAB — POTASSIUM (ARMC VASCULAR LAB ONLY): Potassium (ARMC vascular lab): 5.4 — ABNORMAL HIGH (ref 3.5–5.1)

## 2017-06-02 SURGERY — PERIPHERAL VASCULAR THROMBECTOMY
Anesthesia: Moderate Sedation | Laterality: Left

## 2017-06-02 MED ORDER — HEPARIN SODIUM (PORCINE) 1000 UNIT/ML IJ SOLN
INTRAMUSCULAR | Status: DC | PRN
  Administered 2017-06-02: 4000 [IU] via INTRAVENOUS

## 2017-06-02 MED ORDER — ALTEPLASE 2 MG IJ SOLR
INTRAMUSCULAR | Status: AC
  Filled 2017-06-02: qty 4

## 2017-06-02 MED ORDER — SODIUM CHLORIDE 0.9 % IV SOLN
INTRAVENOUS | Status: DC
  Administered 2017-06-02: 12:00:00 via INTRAVENOUS

## 2017-06-02 MED ORDER — SODIUM CHLORIDE FLUSH 0.9 % IV SOLN
INTRAVENOUS | Status: AC
  Filled 2017-06-02: qty 10

## 2017-06-02 MED ORDER — MIDAZOLAM HCL 2 MG/2ML IJ SOLN
INTRAMUSCULAR | Status: DC | PRN
  Administered 2017-06-02: 2 mg via INTRAVENOUS

## 2017-06-02 MED ORDER — FENTANYL CITRATE (PF) 100 MCG/2ML IJ SOLN
INTRAMUSCULAR | Status: AC
  Filled 2017-06-02: qty 2

## 2017-06-02 MED ORDER — METHYLPREDNISOLONE SODIUM SUCC 125 MG IJ SOLR: 125.0000 mg | INTRAMUSCULAR | Status: DC | PRN

## 2017-06-02 MED ORDER — IOPAMIDOL (ISOVUE-300) INJECTION 61%
INTRAVENOUS | Status: DC | PRN
  Administered 2017-06-02: 40 mL via INTRAVENOUS

## 2017-06-02 MED ORDER — HEPARIN (PORCINE) IN NACL 2-0.9 UNIT/ML-% IJ SOLN
INTRAMUSCULAR | Status: AC
  Filled 2017-06-02: qty 1000

## 2017-06-02 MED ORDER — ALTEPLASE 2 MG IJ SOLR
INTRAMUSCULAR | Status: DC | PRN
  Administered 2017-06-02: 4 mg

## 2017-06-02 MED ORDER — MIDAZOLAM HCL 5 MG/5ML IJ SOLN
INTRAMUSCULAR | Status: AC
  Filled 2017-06-02: qty 5

## 2017-06-02 MED ORDER — LIDOCAINE-EPINEPHRINE (PF) 1 %-1:200000 IJ SOLN
INTRAMUSCULAR | Status: AC
  Filled 2017-06-02: qty 30

## 2017-06-02 MED ORDER — HEPARIN SODIUM (PORCINE) 1000 UNIT/ML IJ SOLN
INTRAMUSCULAR | Status: AC
  Filled 2017-06-02: qty 1

## 2017-06-02 MED ORDER — FAMOTIDINE 20 MG PO TABS: 40.0000 mg | ORAL_TABLET | ORAL | Status: DC | PRN

## 2017-06-02 MED ORDER — FENTANYL CITRATE (PF) 100 MCG/2ML IJ SOLN
INTRAMUSCULAR | Status: DC | PRN
  Administered 2017-06-02: 50 ug via INTRAVENOUS

## 2017-06-02 MED ORDER — CEFAZOLIN SODIUM-DEXTROSE 2-4 GM/100ML-% IV SOLN
INTRAVENOUS | Status: AC
  Filled 2017-06-02: qty 100

## 2017-06-02 MED ORDER — ONDANSETRON HCL 4 MG/2ML IJ SOLN: 4.0000 mg | Freq: Four times a day (QID) | INTRAMUSCULAR | Status: DC | PRN

## 2017-06-02 MED ORDER — HYDROMORPHONE HCL 1 MG/ML IJ SOLN: 1.0000 mg | Freq: Once | INTRAMUSCULAR | Status: DC | PRN

## 2017-06-02 MED ORDER — CEFAZOLIN SODIUM-DEXTROSE 1-4 GM/50ML-% IV SOLN: 1.0000 g | Freq: Once | INTRAVENOUS | Status: DC

## 2017-06-02 SURGICAL SUPPLY — 24 items
BALLN ARMADA 12X80X80 (BALLOONS) ×3
BALLN DORADO7X100X80 (BALLOONS) ×3
BALLN LUTONIX AV 8X60X75 (BALLOONS) ×3
BALLN ULTRVRSE 12X80X75 (BALLOONS)
BALLOON ARMADA 12X80X80 (BALLOONS) ×1 IMPLANT
BALLOON DORADO7X100X80 (BALLOONS) ×1 IMPLANT
BALLOON LUTONIX AV 8X60X75 (BALLOONS) ×1 IMPLANT
BALLOON ULTRVRSE 12X80X75 (BALLOONS) IMPLANT
CANNULA 5F STIFF (CANNULA) ×3 IMPLANT
CATH BEACON 5 .035 40 KMP TP (CATHETERS) ×1 IMPLANT
CATH BEACON 5 .038 40 KMP TP (CATHETERS) ×2
CATH EMBOLECTOMY 5FR (BALLOONS) ×3 IMPLANT
COVER PROBE U/S 5X48 (MISCELLANEOUS) ×3 IMPLANT
DEVICE PRESTO INFLATION (MISCELLANEOUS) ×3 IMPLANT
DRAPE BRACHIAL (DRAPES) ×3 IMPLANT
PACK ANGIOGRAPHY (CUSTOM PROCEDURE TRAY) ×3 IMPLANT
SET AVX THROMB ULT (MISCELLANEOUS) ×3 IMPLANT
SHEATH BRITE TIP 6FRX5.5 (SHEATH) ×6 IMPLANT
SUT MNCRL AB 4-0 PS2 18 (SUTURE) ×3 IMPLANT
SYR MEDRAD MARK V 150ML (SYRINGE) ×3 IMPLANT
TOWEL OR 17X26 4PK STRL BLUE (TOWEL DISPOSABLE) ×3 IMPLANT
TUBING CONTRAST HIGH PRESS 72 (TUBING) ×3 IMPLANT
WIRE J 3MM .035X145CM (WIRE) ×3 IMPLANT
WIRE MAGIC TOR.035 180C (WIRE) ×6 IMPLANT

## 2017-06-02 NOTE — Discharge Instructions (Signed)
Fistulogram, Care After °Refer to this sheet in the next few weeks. These instructions provide you with information on caring for yourself after your procedure. Your health care provider may also give you more specific instructions. Your treatment has been planned according to current medical practices, but problems sometimes occur. Call your health care provider if you have any problems or questions after your procedure. °What can I expect after the procedure? °After your procedure, it is typical to have the following: °· A small amount of discomfort in the area where the catheters were placed. °· A small amount of bruising around the fistula. °· Sleepiness and fatigue. ° °Follow these instructions at home: °· Rest at home for the day following your procedure. °· Do not drive or operate heavy machinery while taking pain medicine. °· Take medicines only as directed by your health care provider. °· Do not take baths, swim, or use a hot tub until your health care provider approves. You may shower 24 hours after the procedure or as directed by your health care provider. °· There are many different ways to close and cover an incision, including stitches, skin glue, and adhesive strips. Follow your health care provider's instructions on: °? Incision care. °? Bandage (dressing) changes and removal. °? Incision closure removal. °· Monitor your dialysis fistula carefully. °Contact a health care provider if: °· You have drainage, redness, swelling, or pain at your catheter site. °· You have a fever. °· You have chills. °Get help right away if: °· You feel weak. °· You have trouble balancing. °· You have trouble moving your arms or legs. °· You have problems with your speech or vision. °· You can no longer feel a vibration or buzz when you put your fingers over your dialysis fistula. °· The limb that was used for the procedure: °? Swells. °? Is painful. °? Is cold. °? Is discolored, such as blue or pale white. °This  information is not intended to replace advice given to you by your health care provider. Make sure you discuss any questions you have with your health care provider. °Document Released: 11/20/2013 Document Revised: 12/12/2015 Document Reviewed: 08/25/2013 °Elsevier Interactive Patient Education © 2018 [employer] ° ° °Moderate Conscious Sedation, Adult, Care After °These instructions provide you with information about caring for yourself after your procedure. Your health care provider may also give you more specific instructions. Your treatment has been planned according to current medical practices, but problems sometimes occur. Call your health care provider if you have any problems or questions after your procedure. °What can I expect after the procedure? °After your procedure, it is common: °· To feel sleepy for several hours. °· To feel clumsy and have poor balance for several hours. °· To have poor judgment for several hours. °· To vomit if you eat too soon. ° °Follow these instructions at home: °For at least 24 hours after the procedure: ° °· Do not: °? Participate in activities where you could fall or become injured. °? Drive. °? Use heavy machinery. °? Drink alcohol. °? Take sleeping pills or medicines that cause drowsiness. °? Make important decisions or sign legal documents. °? Take care of children on your own. °· Rest. °Eating and drinking °· Follow the diet recommended by your health care provider. °· If you vomit: °? Drink water, juice, or soup when you can drink without vomiting. °? Make sure you have little or no nausea before eating solid foods. °General instructions °· Have a responsible adult stay   with you until you are awake and alert. °· Take over-the-counter and prescription medicines only as told by your health care provider. °· If you smoke, do not smoke without supervision. °· Keep all follow-up visits as told by your health care provider. This is important. °Contact a health care  provider if: °· You keep feeling nauseous or you keep vomiting. °· You feel light-headed. °· You develop a rash. °· You have a fever. °Get help right away if: °· You have trouble breathing. °This information is not intended to replace advice given to you by your health care provider. Make sure you discuss any questions you have with your health care provider. °Document Released: 04/26/2013 Document Revised: 12/09/2015 Document Reviewed: 10/26/2015 °Elsevier Interactive Patient Education © 2018 [employer]. ° °

## 2017-06-02 NOTE — Progress Notes (Signed)
Pt's daughter Norberto Sorenson called, states she will be able to give ride home after procedure. Her contact number is [phone]

## 2017-06-02 NOTE — OR Nursing (Signed)
eys itching reports feeling like grit is in them. Irrigated with NS flush, resolved.

## 2017-06-02 NOTE — H&P (Signed)
[facility] Admission History & Physical  MRN : [id number]  Barbara [name] is a 76 y.o. ([birth year]) female who presents with chief complaint of No chief complaint on file. Marland Kitchen  History of Present Illness: I am asked to evaluate the patient by the dialysis center. The patient was sent here because they were unable to cannulate the access this morning. Furthermore the Center states there is no thrill or bruit. The patient states this is the first dialysis run to be missed. This problem is acute in onset and has been present for approximately 2 days. The patient is unaware of any other change.  Patient denies pain or tenderness overlying the access.  There is no pain with dialysis.  The patient denies hand pain or finger pain consistent with steal syndrome.   There have been past interventions or declots of this access.  The patient is not chronically hypotensive on dialysis.  Current Facility-Administered Medications  Medication Dose Route Frequency Provider Last Rate Last Dose  . 0.9 %  sodium chloride infusion   Intravenous Continuous Stegmayer, Kimberly A, PA-C      . ceFAZolin (ANCEF) IVPB 1 g/50 mL premix  1 g Intravenous Once Stegmayer, Kimberly A, PA-C      . ceFAZolin (ANCEF) IVPB 2g/100 mL premix  2 g Intravenous Once Hollice Espy, MD      . famotidine (PEPCID) tablet 40 mg  40 mg Oral PRN Stegmayer, Janalyn Harder, PA-C      . HYDROmorphone (DILAUDID) injection 1 mg  1 mg Intravenous Once PRN Stegmayer, Kimberly A, PA-C      . methylPREDNISolone sodium succinate (SOLU-MEDROL) 125 mg/2 mL injection 125 mg  125 mg Intravenous PRN Stegmayer, Kimberly A, PA-C      . ondansetron (ZOFRAN) injection 4 mg  4 mg Intravenous Q6H PRN Stegmayer, Janalyn Harder, PA-C        Past Medical History:  Diagnosis Date  . Adult celiac disease   . Anemia associated with chronic renal failure 2017   blood transfusion last week 10/17  . Arthritis   . Asthma   . Cancer ([place]) 2017    bladder  . Chronic kidney disease   . 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  dr. Candiss Norse and dr. Holley Raring follow her  . COPD (chronic obstructive pulmonary disease) ([place])   . Coronary artery disease   . Diabetes mellitus without complication ([place])   . Dialysis patient ([place])    Tues, Thurs, Sat  . Dyspnea    with exertion  . Elevated lipids   . GERD (gastroesophageal reflux disease)   . Hematuria   . Hypertension   . Lower back pain   . Neuropathy   . Oxygen dependent    at hs  . Personal history of chemotherapy   . Sleep apnea    uses cpap  . Urinary obstruction 01/2016    Past Surgical History:  Procedure Laterality Date  . ABDOMINAL HYSTERECTOMY    . KIDNEY SURGERY  01/21/2016   IR NEPHROSTOMY PLACEMENT LEFT   . PORTACATH PLACEMENT Right   . ROTATOR CUFF REPAIR Left     Social History Social History   Tobacco Use  . Smoking status: Former Smoker    Types: Cigarettes    Last attempt to quit: 07/20/2002    Years since quitting: 14.8  . Smokeless tobacco: Never Used  . Tobacco comment: 01/22/2016   "  quit smoking many years ago "  Substance Use Topics  . Alcohol use: No  . Drug use: No    Family History Family History  Problem Relation Age of Onset  . Diabetes Mother   . Cancer Father   . Breast cancer Neg Hx   . Kidney disease Neg Hx     No family history of bleeding or clotting disorders, autoimmune disease or porphyria  Allergies  Allergen Reactions  . Glipizide Er     Dropped blood sugar too much  . Percocet [Oxycodone-Acetaminophen] Hives     REVIEW OF SYSTEMS (Negative unless checked)  Constitutional: [] Weight loss  [] Fever  [] Chills Cardiac: [] Chest pain   [] Chest pressure   [] Palpitations   [] Shortness of breath when laying flat   [] Shortness of breath at rest   [x] Shortness of breath with exertion. Vascular:  [] Pain in legs with walking   [] Pain in legs at rest   [] Pain in legs when laying flat   [] Claudication   [] Pain in  feet when walking  [] Pain in feet at rest  [] Pain in feet when laying flat   [] History of DVT   [] Phlebitis   [] Swelling in legs   [] Varicose veins   [] Non-healing ulcers Pulmonary:   [] Uses home oxygen   [] Productive cough   [] Hemoptysis   [] Wheeze  [] COPD   [] Asthma Neurologic:  [] Dizziness  [] Blackouts   [] Seizures   [] History of stroke   [] History of TIA  [] Aphasia   [] Temporary blindness   [] Dysphagia   [] Weakness or numbness in arms   [] Weakness or numbness in legs Musculoskeletal:  [] Arthritis   [] Joint swelling   [] Joint pain   [] Low back pain Hematologic:  [] Easy bruising  [] Easy bleeding   [] Hypercoagulable state   [] Anemic  [] Hepatitis Gastrointestinal:  [] Blood in stool   [] Vomiting blood  [] Gastroesophageal reflux/heartburn   [] Difficulty swallowing. Genitourinary:  [x] Chronic kidney disease   [] Difficult urination  [] Frequent urination  [] Burning with urination   [] Blood in urine Skin:  [] Rashes   [] Ulcers   [] Wounds Psychological:  [] History of anxiety   []  History of major depression.  Physical Examination  There were no vitals filed for this visit. There is no height or weight on file to calculate BMI. Gen: WD/WN, NAD Head: [place]/AT, No temporalis wasting. Prominent temp pulse not noted. Ear/Nose/Throat: Hearing grossly intact, nares w/o erythema or drainage, oropharynx w/o Erythema/Exudate,  Eyes: Conjunctiva clear, sclera non-icteric Neck: Trachea midline.  No JVD.  Pulmonary:  Good air movement, respirations not labored, no use of accessory muscles.  Cardiac: RRR, normal S1, S2. Vascular: no thrill in Access Vessel Right Left  Radial Palpable Palpable               Musculoskeletal: M/S 5/5 throughout.  Extremities without ischemic changes.  No deformity or atrophy.  Neurologic: Sensation grossly intact in extremities.  Symmetrical.  Speech is fluent. Motor exam as listed above. Psychiatric: Judgment intact, Mood & affect appropriate for pt's clinical  situation. Dermatologic: No rashes or ulcers noted.  No cellulitis or open wounds.    CBC Lab Results  Component Value Date   WBC 8.2 05/11/2017   HGB 10.2 (L) 05/11/2017   HCT 32.2 (L) 05/11/2017   MCV 91.8 05/11/2017   PLT 117 (L) 05/11/2017    BMET    Component Value Date/Time   NA 139 05/11/2017 0936   NA 138 11/04/2016 1453   NA 138 08/07/2012 0433   K 4.3 05/11/2017 0936   K 3.2 (L) 10/30/2014 1410  CL 105 05/11/2017 0936   CL 104 08/07/2012 0433   CO2 22 05/11/2017 0936   CO2 25 08/07/2012 0433   GLUCOSE 110 (H) 05/11/2017 0936   GLUCOSE 172 (H) 08/07/2012 0433   BUN 77 (H) 05/11/2017 0936   BUN 69 (H) 11/04/2016 1453   BUN 63 (H) 08/07/2012 0433   CREATININE 8.67 (H) 05/11/2017 0936   CREATININE 2.48 (H) 08/07/2012 0433   CALCIUM 8.6 (L) 05/11/2017 0936   CALCIUM 9.1 08/07/2012 0433   GFRNONAA 4 (L) 05/11/2017 0936   GFRNONAA 19 (L) 08/07/2012 0433   GFRAA 5 (L) 05/11/2017 0936   GFRAA 22 (L) 08/07/2012 0433   CrCl cannot be calculated (Patient's most recent lab result is older than the maximum 21 days allowed.).  COAG Lab Results  Component Value Date   INR 1.08 02/08/2017   INR 1.23 01/25/2016   INR 1.38 01/24/2016    Radiology Dg Chest 2 View  Result Date: 05/11/2017 CLINICAL DATA:  Chest pain EXAM: CHEST  2 VIEW COMPARISON:  01/24/2017 FINDINGS: Interval removal of dialysis catheter. Right Port-A-Cath remains in place, unchanged. Heart is upper limits normal in size. Linear densities in the lung bases, atelectasis versus scarring. No visible effusions. There is hyperinflation of the lungs compatible with COPD. No acute bony abnormality. IMPRESSION: COPD.  Bibasilar atelectasis or scarring. Electronically Signed   By: Rolm Baptise M.D.   On: 05/11/2017 10:17   Ct Head Wo Contrast  Result Date: 05/11/2017 CLINICAL DATA:  Headache. EXAM: CT HEAD WITHOUT CONTRAST TECHNIQUE: Contiguous axial images were obtained from the base of the skull through  the vertex without intravenous contrast. COMPARISON:  04/17/2016 FINDINGS: Brain: Mild age related volume loss. Patchy chronic small vessel disease changes throughout the deep white matter. No acute intracranial abnormality. Specifically, no hemorrhage, hydrocephalus, mass lesion, acute infarction, or significant intracranial injury. Vascular: No hyperdense vessel or unexpected calcification. Skull: No acute calvarial abnormality. Sinuses/Orbits: Visualized paranasal sinuses and mastoids clear. Orbital soft tissues unremarkable. Other: None IMPRESSION: No acute intracranial abnormality. Atrophy, chronic microvascular disease. Electronically Signed   By: Rolm Baptise M.D.   On: 05/11/2017 11:48    Assessment/Plan 1.  Complication dialysis device with thrombosis AV access:  Patient's dialysis access is thrombosed. The patient will undergo thrombectomy using interventional techniques.  The risks and benefits were described to the patient.  All questions were answered.  The patient agrees to proceed with angiography and intervention. Potassium will be drawn to ensure that it is an appropriate level prior to performing thrombectomy. 2.  End-stage renal disease requiring hemodialysis:  Patient will continue dialysis therapy without further interruption if a successful thrombectomy is not achieved then catheter will be placed. Dialysis has already been arranged since the patient missed their previous session 3.  Hypertension:  Patient will continue medical management; nephrology is following no changes in oral medications. 4. Diabetes mellitus:  Glucose will be monitored and oral medications been held this morning once the patient has undergone the patient's procedure po intake will be reinitiated and again Accu-Cheks will be used to assess the blood glucose level and treat as needed. The patient will be restarted on the patient's usual hypoglycemic regime 5.  Coronary artery disease:  EKG will be monitored.  Nitrates will be used if needed. The patient's oral cardiac medications will be continued.    Leotis Pain, MD  06/02/2017 11:49 AM

## 2017-06-02 NOTE — Progress Notes (Signed)
Pt states she drove herself to hospital today.  Pt's daughter and husband called and asked if they can pick up pt from hospital after procedure, both state they are unable.  Husband states he will call around to see if anyone else can pick her up.  Charge nurse informed that pt may not have ride home.  After consultation w/ charge, plan is to wait until closer to pt's procedure time, call family to see if ride is available, if no ride is available, reschedule.  Pt informed of plan and agreeable.

## 2017-06-02 NOTE — Op Note (Signed)
[facility]    OPERATIVE NOTE   PROCEDURE: 1.  Left brachial artery to axillary vein arteriovenous graft cannulation under ultrasound guidance in both a retrograde and then antegrade fashion crossing 2.  Left arm shuntogram and central venogram 3.  Catheter directed thrombolysis with 4 mg of TPA delivered with the AngioJet AVX catheter 4.  Mechanical rheolytic thrombectomy to the left brachial artery to axillary vein AV graft with the AngioJet AVX catheter 5.  Fogarty embolectomy for residual arterial plug 6.  Percutaneous transluminal angioplasty of arterial anastomosis with 7 mm diameter by 10 cm length angioplasty balloon 7.  Percutaneous transluminal angioplasty of the venous anastomosis and distal graft with 7 mm diameter high pressure and 8 mm diameter Lutonix drug-coated angioplasty balloon 8.  Percutaneous transluminal angioplasty of the left innominate vein with 12 mm diameter by 8 cm length angioplasty balloon  PRE-OPERATIVE DIAGNOSIS: 1. ESRD 2.  Thrombosed left brachial artery to axillary vein arteriovenous graft  POST-OPERATIVE DIAGNOSIS: same as above   SURGEON: Leotis Pain, MD  ANESTHESIA: local with Moderate Conscious Sedation for approximately 35 minutes using 2 mg of Versed and 50 Mcg of Fentanyl  ESTIMATED BLOOD LOSS: 20 cc  FINDING(S): 1. 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  The patient is scheduled for an attempted declot and shuntogram.  The patient is aware the risks include but are not limited to: bleeding, infection, thrombosis of the cannulated access, and possible anaphylactic reaction to the contrast.  The patient is aware of the risks of the  procedure and elects to proceed forward.  DESCRIPTION: After full informed written consent was obtained, the patient was brought back to the angiography suite and placed supine upon the angiography table.  The patient was connected to monitoring equipment. Moderate conscious sedation was administered with a face to face encounter with the patient throughout the procedure with my supervision of the RN administering medicines and monitoring the patient's vital signs, pulse oximetry, telemetry and mental status throughout from the start of the procedure until the patient was taken to the recovery room. The left arm was prepped and draped in the standard fashion for a percutaneous access intervention.  Under ultrasound guidance, the left brachial artery to axillary vein arteriovenous graft was cannulated with a micropuncture needle under direct ultrasound guidance due to the pulseless nature of the graft in both an antegrade and a retrograde fashion crossing, and permanent images were performed.  The microwire was advanced and the needle was exchanged for the a microsheath.  I then upsized to a 6 Fr Sheath and imaging was performed.  Hand injections were completed to image the access including the central venous system. This demonstrated no flow within the AV graft.  Based on the images, this patient will need extensive treatment to salvage the graft. I then gave the patient 4000 units of intravenous heparin.  I then placed a Magic torque wire into the brachial artery from the retrograde sheath and into the axillary and subclavian vein from the antegrade sheath. 4 mg of TPA were deployed throughout the entirety of the graft and into the axillary vein. This was allowed to dwell. Mechanical rheolytic thrombectomy was then performed throughout the graft  and into the axillary vein. This uncovered the venous anastomotic stenosis as well as the central venous stenosis.  Both were in the 70-80% range.  A residual arterial  plug was also seen at the arterial anastomosis. An attempt to clear the arterial plug was done with 2 passes of the Fogarty embolectomy balloon. Flow-limiting arterial plug remained, and I elected to treat this lesion with a 7 mm diameter by 10 cm length high-pressure angioplasty balloon.  This was inflated to 10 atm for 1 minute.  This resulted in resolution of the arterial plug, and clearance of the arterial side of the graft. The arterial outflow was seen to be intact through the radial and ulnar arteries as well on these images. The retrograde sheath was removed. I then turned my attention to the thrombus in the distal graft and the axillary vein. Mechanical rheolytic thrombectomy was performed. This resulted in near resolution of the thrombus but the stenosis remained at the venous anastomosis as well as centrally.  I then elected to treat this with 7 mm diameter by 10 cm length high-pressure angioplasty balloon at the venous anastomosis.  This was inflated up to about 12 atm for 1 minute.  There still remains some moderate narrowing and I used an 8 mm diameter by 6 cm length Lutonix drug-coated angioplasty balloon for a second inflation.  With this inflated to 8 atm for 1 minute, only about a 20-25% residual stenosis remained. I then turned my attention to the central venous stenosis.  The left innominate vein lesion was crossed without difficulty with a Magic torque wire.  A 12 mm diameter by 8 cm length angioplasty balloon was then inflated to 6 atm for 1 minute.  Completion imaging demonstrated only about a 15-20% residual stenosis in the left innominate after this angioplasty.    Based on the completion imaging, no further intervention is necessary.  The wire and balloon were removed from the sheath.  A 4-0 Monocryl purse-string suture was sewn around the sheath.  The sheath was removed while tying down the suture.  A sterile bandage was applied to the puncture site.  COMPLICATIONS: None  CONDITION:  Stable   Leotis Pain 06/02/2017 3:10 PM   This note was created with Dragon Medical transcription system. Any errors in dictation are purely unintentional.

## 2017-06-03 ENCOUNTER — Encounter: Payer: Self-pay | Admitting: Vascular Surgery

## 2017-06-07 ENCOUNTER — Ambulatory Visit: Payer: Medicare Other | Admitting: Podiatry

## 2017-06-09 ENCOUNTER — Ambulatory Visit
Admission: RE | Admit: 2017-06-09 | Discharge: 2017-06-09 | Disposition: A | Discharge status: Home / Self Care | Payer: Medicare Other | Source: Ambulatory Visit | Attending: Urology | Admitting: Urology

## 2017-06-09 ENCOUNTER — Encounter: Payer: Self-pay | Admitting: Anesthesiology

## 2017-06-09 ENCOUNTER — Other Ambulatory Visit: Payer: Self-pay

## 2017-06-09 ENCOUNTER — Ambulatory Visit: Payer: Medicare Other | Admitting: Anesthesiology

## 2017-06-09 ENCOUNTER — Encounter
Admission: RE | Disposition: A | Discharge status: Home / Self Care | Payer: Self-pay | Source: Ambulatory Visit | Attending: Urology

## 2017-06-09 DIAGNOSIS — Z8551 Personal history of malignant neoplasm of bladder: Secondary | ICD-10-CM | POA: Diagnosis not present

## 2017-06-09 DIAGNOSIS — N1330 Unspecified hydronephrosis: Secondary | ICD-10-CM | POA: Diagnosis not present

## 2017-06-09 DIAGNOSIS — J449 Chronic obstructive pulmonary disease, unspecified: Secondary | ICD-10-CM | POA: Diagnosis not present

## 2017-06-09 DIAGNOSIS — Z79899 Other long term (current) drug therapy: Secondary | ICD-10-CM | POA: Insufficient documentation

## 2017-06-09 DIAGNOSIS — E1151 Type 2 diabetes mellitus with diabetic peripheral angiopathy without gangrene: Secondary | ICD-10-CM | POA: Insufficient documentation

## 2017-06-09 DIAGNOSIS — M1990 Unspecified osteoarthritis, unspecified site: Secondary | ICD-10-CM | POA: Diagnosis not present

## 2017-06-09 DIAGNOSIS — N3289 Other specified disorders of bladder: Secondary | ICD-10-CM | POA: Diagnosis not present

## 2017-06-09 DIAGNOSIS — Z87891 Personal history of nicotine dependence: Secondary | ICD-10-CM | POA: Insufficient documentation

## 2017-06-09 DIAGNOSIS — K219 Gastro-esophageal reflux disease without esophagitis: Secondary | ICD-10-CM | POA: Diagnosis not present

## 2017-06-09 DIAGNOSIS — I2510 Atherosclerotic heart disease of native coronary artery without angina pectoris: Secondary | ICD-10-CM | POA: Diagnosis not present

## 2017-06-09 DIAGNOSIS — N329 Bladder disorder, unspecified: Secondary | ICD-10-CM | POA: Diagnosis present

## 2017-06-09 DIAGNOSIS — N186 End stage renal disease: Secondary | ICD-10-CM | POA: Diagnosis not present

## 2017-06-09 DIAGNOSIS — D494 Neoplasm of unspecified behavior of bladder: Secondary | ICD-10-CM | POA: Diagnosis not present

## 2017-06-09 DIAGNOSIS — E1122 Type 2 diabetes mellitus with diabetic chronic kidney disease: Secondary | ICD-10-CM | POA: Insufficient documentation

## 2017-06-09 DIAGNOSIS — D649 Anemia, unspecified: Secondary | ICD-10-CM | POA: Insufficient documentation

## 2017-06-09 DIAGNOSIS — G4730 Sleep apnea, unspecified: Secondary | ICD-10-CM | POA: Insufficient documentation

## 2017-06-09 DIAGNOSIS — I120 Hypertensive chronic kidney disease with stage 5 chronic kidney disease or end stage renal disease: Secondary | ICD-10-CM | POA: Insufficient documentation

## 2017-06-09 HISTORY — PX: CYSTOSCOPY WITH BIOPSY: SHX5122

## 2017-06-09 HISTORY — PX: CYSTOSCOPY W/ RETROGRADES: SHX1426

## 2017-06-09 LAB — POCT I-STAT 4, (NA,K, GLUC, HGB,HCT)
Glucose, Bld: 89 mg/dL (ref 65–99)
HCT: 37 % (ref 36.0–46.0)
Hemoglobin: 12.6 g/dL (ref 12.0–15.0)
Potassium: 4.3 mmol/L (ref 3.5–5.1)
Sodium: 144 mmol/L (ref 135–145)

## 2017-06-09 LAB — GLUCOSE, CAPILLARY
GLUCOSE-CAPILLARY: 82 mg/dL (ref 65–99)
Glucose-Capillary: 88 mg/dL (ref 65–99)

## 2017-06-09 SURGERY — CYSTOSCOPY, WITH BIOPSY
Anesthesia: General | Site: Bladder | Wound class: Clean Contaminated

## 2017-06-09 MED ORDER — CEFAZOLIN SODIUM-DEXTROSE 2-3 GM-%(50ML) IV SOLR
INTRAVENOUS | Status: DC | PRN
  Administered 2017-06-09: 2 g via INTRAVENOUS

## 2017-06-09 MED ORDER — CEFAZOLIN SODIUM-DEXTROSE 2-4 GM/100ML-% IV SOLN: 2.0000 g | Freq: Once | INTRAVENOUS | Status: DC

## 2017-06-09 MED ORDER — IOTHALAMATE MEGLUMINE 43 % IV SOLN
INTRAVENOUS | Status: DC | PRN
  Administered 2017-06-09: 15 mL via URETHRAL

## 2017-06-09 MED ORDER — CEFAZOLIN SODIUM-DEXTROSE 2-4 GM/100ML-% IV SOLN
INTRAVENOUS | Status: AC
  Filled 2017-06-09: qty 100

## 2017-06-09 MED ORDER — DEXAMETHASONE SODIUM PHOSPHATE 10 MG/ML IJ SOLN
INTRAMUSCULAR | Status: DC | PRN
  Administered 2017-06-09: 10 mg via INTRAVENOUS

## 2017-06-09 MED ORDER — DEXAMETHASONE SODIUM PHOSPHATE 10 MG/ML IJ SOLN
INTRAMUSCULAR | Status: AC
  Filled 2017-06-09: qty 1

## 2017-06-09 MED ORDER — ONDANSETRON HCL 4 MG/2ML IJ SOLN
INTRAMUSCULAR | Status: AC
  Filled 2017-06-09: qty 2

## 2017-06-09 MED ORDER — TRAMADOL HCL 50 MG PO TABS: 50.0000 mg | ORAL_TABLET | Freq: Four times a day (QID) | ORAL | 0 refills | Status: DC | PRN

## 2017-06-09 MED ORDER — ONDANSETRON HCL 4 MG/2ML IJ SOLN: 4.0000 mg | Freq: Once | INTRAMUSCULAR | Status: DC | PRN

## 2017-06-09 MED ORDER — PROPOFOL 10 MG/ML IV BOLUS
INTRAVENOUS | Status: DC | PRN
  Administered 2017-06-09: 30 mg via INTRAVENOUS
  Administered 2017-06-09: 100 mg via INTRAVENOUS

## 2017-06-09 MED ORDER — MIDAZOLAM HCL 2 MG/2ML IJ SOLN
INTRAMUSCULAR | Status: AC
  Filled 2017-06-09: qty 2

## 2017-06-09 MED ORDER — ONDANSETRON HCL 4 MG/2ML IJ SOLN
INTRAMUSCULAR | Status: DC | PRN
  Administered 2017-06-09: 4 mg via INTRAVENOUS

## 2017-06-09 MED ORDER — FENTANYL CITRATE (PF) 100 MCG/2ML IJ SOLN
25.0000 ug | INTRAMUSCULAR | Status: DC | PRN
  Administered 2017-06-09: 25 ug via INTRAVENOUS

## 2017-06-09 MED ORDER — PROPOFOL 10 MG/ML IV BOLUS
INTRAVENOUS | Status: AC
  Filled 2017-06-09: qty 20

## 2017-06-09 MED ORDER — FENTANYL CITRATE (PF) 100 MCG/2ML IJ SOLN
INTRAMUSCULAR | Status: AC
  Administered 2017-06-09: 25 ug via INTRAVENOUS
  Filled 2017-06-09: qty 2

## 2017-06-09 MED ORDER — SODIUM CHLORIDE 0.9 % IV SOLN
INTRAVENOUS | Status: DC
  Administered 2017-06-09: 10:00:00 via INTRAVENOUS

## 2017-06-09 MED ORDER — FENTANYL CITRATE (PF) 100 MCG/2ML IJ SOLN
INTRAMUSCULAR | Status: DC | PRN
  Administered 2017-06-09 (×2): 25 ug via INTRAVENOUS

## 2017-06-09 MED ORDER — FENTANYL CITRATE (PF) 100 MCG/2ML IJ SOLN
INTRAMUSCULAR | Status: AC
  Filled 2017-06-09: qty 2

## 2017-06-09 SURGICAL SUPPLY — 25 items
BAG DRAIN CYSTO-URO LG1000N (MISCELLANEOUS) ×4 IMPLANT
BRUSH SCRUB EZ  4% CHG (MISCELLANEOUS)
BRUSH SCRUB EZ 4% CHG (MISCELLANEOUS) IMPLANT
CATH URETL 5X70 OPEN END (CATHETERS) ×4 IMPLANT
CONRAY 43 FOR UROLOGY 50M (MISCELLANEOUS) ×4 IMPLANT
DRAPE UTILITY 15X26 TOWEL STRL (DRAPES) ×4 IMPLANT
DRSG TELFA 4X3 1S NADH ST (GAUZE/BANDAGES/DRESSINGS) ×4 IMPLANT
ELECT REM PT RETURN 9FT ADLT (ELECTROSURGICAL) ×4
ELECTRODE REM PT RTRN 9FT ADLT (ELECTROSURGICAL) ×2 IMPLANT
GLOVE BIO SURGEON STRL SZ 6.5 (GLOVE) ×9 IMPLANT
GLOVE BIO SURGEONS STRL SZ 6.5 (GLOVE) ×3
GOWN STRL REUS W/ TWL LRG LVL3 (GOWN DISPOSABLE) ×6 IMPLANT
GOWN STRL REUS W/TWL LRG LVL3 (GOWN DISPOSABLE) ×6
KIT RM TURNOVER CYSTO AR (KITS) ×4 IMPLANT
NDL SAFETY ECLIPSE 18X1.5 (NEEDLE) IMPLANT
NEEDLE HYPO 18GX1.5 SHARP (NEEDLE)
PACK CYSTO AR (MISCELLANEOUS) ×4 IMPLANT
SENSORWIRE 0.038 NOT ANGLED (WIRE) ×4
SET CYSTO W/LG BORE CLAMP LF (SET/KITS/TRAYS/PACK) ×4 IMPLANT
SOL .9 NS 3000ML IRR  AL (IV SOLUTION) ×2
SOL .9 NS 3000ML IRR UROMATIC (IV SOLUTION) ×2 IMPLANT
SURGILUBE 2OZ TUBE FLIPTOP (MISCELLANEOUS) ×4 IMPLANT
WATER STERILE IRR 1000ML POUR (IV SOLUTION) ×4 IMPLANT
WATER STERILE IRR 3000ML UROMA (IV SOLUTION) ×4 IMPLANT
WIRE SENSOR 0.038 NOT ANGLED (WIRE) ×2 IMPLANT

## 2017-06-09 NOTE — Transfer of Care (Signed)
Immediate Anesthesia Transfer of Care Note  Patient: Barbara [name]  Procedure(s) Performed: CYSTOSCOPY WITH BLADDER BIOPSY (N/A Bladder) CYSTOSCOPY WITH RETROGRADE PYELOGRAM (Bilateral Bladder)  Patient Location: PACU  Anesthesia Type:General  Level of Consciousness: sedated  Airway & Oxygen Therapy: Patient Spontanous Breathing and Patient connected to face mask oxygen  Post-op Assessment: Report given to RN  Post vital signs: Reviewed and stable  Last Vitals:  Vitals:   06/09/17 1004 06/09/17 1337  BP: (!) 135/53 118/63  Pulse: 63 62  Resp: 17 (!) 8  Temp: (!) 36.3 C 36.6 C  SpO2: 97% 98%    Last Pain:  Vitals:   06/09/17 1004  TempSrc: Tympanic         Complications: No apparent anesthesia complications

## 2017-06-09 NOTE — Op Note (Signed)
Date of procedure: 06/09/17  Preoperative diagnosis:  1. History of bladder cancer 2. End-stage renal renal disease 3. Bilateral hydronephrosis 4. Bladder lesions  Postoperative diagnosis:  1. Same as above  Procedure: 1. Cystoscopy 2. Right retrograde pyelogram 3. Bladder biopsy times multiple  Surgeon: Hollice Espy, MD  Anesthesia: General  Complications: None  Intraoperative findings: Diffusely friable bladder with multiple hemorrhagic appearing plaques as well as a fine yellowish crystalline-like deposit diffusely throughout bladder.  No papillary tumor.  Right UO capacious, left UO scarred down, unable to cannulate.  Right retrograde pyelogram with moderate hydroureteronephrosis down to the level of the bladder.  EBL: Minimal  Specimens: Bladder biopsies times multiple  Drains: None  Indication: KAMAIYAH USELTON is a 76 y.o. 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  Today, she had no recurrence but does have what appears erythematous hemorrhagic appearing plaques most likely consistent with radiation change although underlying CIS cannot be ruled out.  She also previously had indwelling ureteral stents which had been removed since her renal disease has progressed to end-stage and she is now on dialysis.  She was counseled to undergo biopsy of these bladder lesions given their suspicious appearance..  After reviewing the management options for treatment, he elected to proceed with the above surgical procedure(s). We have discussed the potential benefits and risks of the procedure, side effects of the proposed treatment, the likelihood of the patient achieving the goals of the procedure, and any potential problems that might occur during the procedure or recuperation. Informed consent has been obtained.  Description of procedure:  The patient was taken to the operating room and general anesthesia was  induced.  The patient was placed in the dorsal lithotomy position, prepped and draped in the usual sterile fashion, and preoperative antibiotics were administered. A preoperative time-out was performed.   A 21 French cystoscope was advanced per urethra to the bladder.  The bladder was carefully inspected at this point in time and noted to have diffuse involvement with multiple erythematous plaque-like lesions throughout the bladder.  The overall appearance was extremely friable.  There is also a yellowish crystalline-like deposit in the entire surface of the bladder which was focally concentrated around the location of the left ureteral orifice.  It was unclear whether this yellowish deposit was crystalline material or ulceration with fat.  The right ureteral orifice was capacious and gaping, the left ureteral orifice was stenotic, barely identifiable with a scarlike appearance.  Attention was first turned to the right ureteral orifice which was cannulated using a 5 [place] open-ended ureteral catheter.  Ureteral catheter was advanced up the ureter retrograde pyelogram was performed.  This revealed moderate hydroureteronephrosis all the way down to level the bladder without any obvious filling defects.  Unfortunately, was unable to cannulate the left ureteral orifice due to scarring and thus unable to perform a retrograde pyelogram on this side.  At this point time, cold cup biopsy forceps were used to biopsy the bladder in at least 5 locations, at the dome, posterior wall, left and right lateral bladder wall as well as trigone.  These were passed off the field as random bladder biopsies although they were targeted to the suspicious areas.  Bugbee electrocautery was then used to fulgurate the area.  Once adequate hemostasis was achieved, the bladder was drained.  The patient was cleaned and dried, repositioned in supine position, reversed from anesthesia, taken to the PACU in stable condition.  Plan: We will  call the patient with her pathology results as I am highly suspicious that this is likely benign.  If that is the case, we will continue to follow her cystoscopically every 3 months as previously recommended.    Hollice Espy, M.D.

## 2017-06-09 NOTE — Anesthesia Procedure Notes (Signed)
Procedure Name: LMA Insertion Performed by: Dionne Bucy, CRNA Pre-anesthesia Checklist: Patient identified, Patient being monitored, Timeout performed, Emergency Drugs available and Suction available Patient Re-evaluated:Patient Re-evaluated prior to induction Oxygen Delivery Method: Circle system utilized Preoxygenation: Pre-oxygenation with 100% oxygen Induction Type: IV induction Ventilation: Mask ventilation without difficulty LMA: LMA inserted LMA Size: 4.0 Tube type: Oral Number of attempts: 1 Placement Confirmation: positive ETCO2 and breath sounds checked- equal and bilateral Tube secured with: Tape Dental Injury: Teeth and Oropharynx as per pre-operative assessment

## 2017-06-09 NOTE — OR Nursing (Signed)
Pt has port in right chest

## 2017-06-09 NOTE — Discharge Instructions (Signed)
Transurethral Resection of Bladder Tumor (TURBT) or Bladder Biopsy   Definition:  Transurethral Resection of the Bladder Tumor is a surgical procedure used to diagnose and remove tumors within the bladder. TURBT is the most common treatment for early stage bladder cancer.  General instructions:     Your recent bladder surgery requires very little post hospital care but some definite precautions.  Despite the fact that no skin incisions were used, the area around the bladder incisions are raw and covered with scabs to promote healing and prevent bleeding. Certain precautions are needed to insure that the scabs are not disturbed over the next 2-4 weeks while the healing proceeds.  Because the raw surface inside your bladder and the irritating effects of urine you may expect frequency of urination and/or urgency (a stronger desire to urinate) and perhaps even getting up at night more often. This will usually resolve or improve slowly over the healing period. You may see some blood in your urine over the first 6 weeks. Do not be alarmed, even if the urine was clear for a while. Get off your feet and drink lots of fluids until clearing occurs. If you start to pass clots or don't improve call us.  Diet:  You may return to your normal diet immediately. Because of the raw surface of your bladder, alcohol, spicy foods, foods high in acid and drinks with caffeine may cause irritation or frequency and should be used in moderation. To keep your urine flowing freely and avoid constipation, drink plenty of fluids during the day (8-10 glasses). Tip: Avoid cranberry juice because it is very acidic.  Activity:  Your physical activity doesn't need to be restricted. However, if you are very active, you may see some blood in the urine. We suggest that you reduce your activity under the circumstances until the bleeding has stopped.  Bowels:  It is important to keep your bowels regular during the postoperative  period. Straining with bowel movements can cause bleeding. A bowel movement every other day is reasonable. Use a mild laxative if needed, such as milk of magnesia 2-3 tablespoons, or 2 Dulcolax tablets. Call if you continue to have problems. If you had been taking narcotics for pain, before, during or after your surgery, you may be constipated. Take a laxative if necessary.    Medication:  You should resume your pre-surgery medications unless told not to. In addition you may be given an antibiotic to prevent or treat infection. Antibiotics are not always necessary. All medication should be taken as prescribed until the bottles are finished unless you are having an unusual reaction to one of the drugs.   [facility] [address], [place],  [address] [phone]     AMBULATORY SURGERY  DISCHARGE INSTRUCTIONS   1) The drugs that you were given will stay in your system until tomorrow so for the next 24 hours you should not:  A) Drive an automobile B) Make any legal decisions C) Drink any alcoholic beverage   2) You may resume regular meals tomorrow.  Today it is better to start with liquids and gradually work up to solid foods.  You may eat anything you prefer, but it is better to start with liquids, then soup and crackers, and gradually work up to solid foods.   3) Please notify your doctor immediately if you have any unusual bleeding, trouble breathing, redness and pain at the surgery site, drainage, fever, or pain not relieved by medication.  4) Additional Instructions:        Please contact your physician with any problems or Same Day Surgery at [phone], Monday through Friday 6 am to 4 pm, or [place] at [facility] number at [phone].

## 2017-06-09 NOTE — Anesthesia Postprocedure Evaluation (Signed)
Anesthesia Post Note  Patient: Barbara [name]  Procedure(s) Performed: CYSTOSCOPY WITH BLADDER BIOPSY (N/A Bladder) CYSTOSCOPY WITH RETROGRADE PYELOGRAM (Bilateral Bladder)  Patient location during evaluation: PACU Anesthesia Type: General Level of consciousness: awake and alert Pain management: pain level controlled Vital Signs Assessment: post-procedure vital signs reviewed and stable Respiratory status: spontaneous breathing, nonlabored ventilation, respiratory function stable and patient connected to nasal cannula oxygen Cardiovascular status: blood pressure returned to baseline and stable Postop Assessment: no apparent nausea or vomiting Anesthetic complications: no     Last Vitals:  Vitals:   06/09/17 1442 06/09/17 1445  BP: (!) 143/59 (!) 145/55  Pulse: 60 64  Resp:    Temp: (!) 36.3 C   SpO2: 94% 95%    Last Pain:  Vitals:   06/09/17 1445  TempSrc:   PainSc: 0-No pain                 Zoya Sprecher S

## 2017-06-09 NOTE — Anesthesia Post-op Follow-up Note (Signed)
Anesthesia QCDR form completed.        

## 2017-06-09 NOTE — Anesthesia Preprocedure Evaluation (Addendum)
Anesthesia Evaluation  Patient identified by MRN, date of birth, ID band Patient awake    Reviewed: Allergy & Precautions, NPO status , Patient's Chart, lab work & pertinent test results, reviewed documented beta blocker date and time   Airway Mallampati: III  TM Distance: >3 FB     Dental  (+) Chipped, Poor Dentition, Missing, Dental Advisory Given   Pulmonary shortness of breath, asthma , sleep apnea and Continuous Positive Airway Pressure Ventilation , COPD, former smoker,           Cardiovascular hypertension, Pt. on medications + CAD and + Peripheral Vascular Disease       Neuro/Psych Seizures -,   Neuromuscular disease    GI/Hepatic GERD  Controlled,  Endo/Other  diabetes, Type 2  Renal/GU ESRFRenal disease     Musculoskeletal  (+) Arthritis ,   Abdominal   Peds  Hematology  (+) anemia ,   Anesthesia Other Findings Bladder ca. Overbite.  Reproductive/Obstetrics                            Anesthesia Physical Anesthesia Plan  ASA: III  Anesthesia Plan: General   Post-op Pain Management:    Induction: Intravenous  PONV Risk Score and Plan:   Airway Management Planned: LMA  Additional Equipment:   Intra-op Plan:   Post-operative Plan:   Informed Consent: I have reviewed the patients History and Physical, chart, labs and discussed the procedure including the risks, benefits and alternatives for the proposed anesthesia with the patient or authorized representative who has indicated his/her understanding and acceptance.     Plan Discussed with: CRNA  Anesthesia Plan Comments:         Anesthesia Quick Evaluation

## 2017-06-09 NOTE — Interval H&P Note (Signed)
History and Physical Interval Note:  06/09/2017 12:21 PM  Barbara [name]  has presented today for surgery, with the diagnosis of HISTORY OF BLADDER CANCER,BLADDER LESION  The various methods of treatment have been discussed with the patient and family. After consideration of risks, benefits and other options for treatment, the patient has consented to  Procedure(s): CYSTOSCOPY WITH BLADDER BIOPSY (N/A) CYSTOSCOPY WITH RETROGRADE PYELOGRAM (Bilateral) as a surgical intervention .  The patient's history has been reviewed, patient examined, no change in status, stable for surgery.  I have reviewed the patient's chart and labs.  Questions were answered to the patient's satisfaction.    RRR CTAB  Hollice Espy

## 2017-06-10 ENCOUNTER — Encounter: Payer: Self-pay | Admitting: Urology

## 2017-06-11 LAB — SURGICAL PATHOLOGY

## 2017-06-14 ENCOUNTER — Other Ambulatory Visit: Payer: Self-pay

## 2017-06-14 ENCOUNTER — Inpatient Hospital Stay: Payer: Medicare Other | Attending: Internal Medicine | Admitting: Internal Medicine

## 2017-06-14 ENCOUNTER — Inpatient Hospital Stay: Payer: Medicare Other

## 2017-06-14 ENCOUNTER — Telehealth: Payer: Self-pay | Admitting: Family Medicine

## 2017-06-14 VITALS — BP 151/69 | HR 70 | Temp 98.0°F | Resp 18 | Wt 177.4 lb

## 2017-06-14 DIAGNOSIS — Z79899 Other long term (current) drug therapy: Secondary | ICD-10-CM | POA: Insufficient documentation

## 2017-06-14 DIAGNOSIS — K219 Gastro-esophageal reflux disease without esophagitis: Secondary | ICD-10-CM | POA: Insufficient documentation

## 2017-06-14 DIAGNOSIS — Z95828 Presence of other vascular implants and grafts: Secondary | ICD-10-CM

## 2017-06-14 DIAGNOSIS — Z992 Dependence on renal dialysis: Secondary | ICD-10-CM | POA: Diagnosis not present

## 2017-06-14 DIAGNOSIS — C670 Malignant neoplasm of trigone of bladder: Secondary | ICD-10-CM | POA: Diagnosis not present

## 2017-06-14 DIAGNOSIS — E1122 Type 2 diabetes mellitus with diabetic chronic kidney disease: Secondary | ICD-10-CM | POA: Insufficient documentation

## 2017-06-14 DIAGNOSIS — I120 Hypertensive chronic kidney disease with stage 5 chronic kidney disease or end stage renal disease: Secondary | ICD-10-CM | POA: Diagnosis not present

## 2017-06-14 DIAGNOSIS — Z9981 Dependence on supplemental oxygen: Secondary | ICD-10-CM | POA: Diagnosis not present

## 2017-06-14 DIAGNOSIS — N186 End stage renal disease: Secondary | ICD-10-CM | POA: Diagnosis not present

## 2017-06-14 DIAGNOSIS — I2510 Atherosclerotic heart disease of native coronary artery without angina pectoris: Secondary | ICD-10-CM | POA: Diagnosis not present

## 2017-06-14 DIAGNOSIS — G4730 Sleep apnea, unspecified: Secondary | ICD-10-CM | POA: Insufficient documentation

## 2017-06-14 DIAGNOSIS — D631 Anemia in chronic kidney disease: Secondary | ICD-10-CM | POA: Insufficient documentation

## 2017-06-14 DIAGNOSIS — J449 Chronic obstructive pulmonary disease, unspecified: Secondary | ICD-10-CM | POA: Insufficient documentation

## 2017-06-14 DIAGNOSIS — Z87891 Personal history of nicotine dependence: Secondary | ICD-10-CM | POA: Insufficient documentation

## 2017-06-14 LAB — CBC WITH DIFFERENTIAL/PLATELET
BASOS PCT: 1 %
Basophils Absolute: 0 10*3/uL (ref 0–0.1)
Eosinophils Absolute: 0.1 10*3/uL (ref 0–0.7)
Eosinophils Relative: 3 %
HEMATOCRIT: 32.4 % — AB (ref 35.0–47.0)
Hemoglobin: 10.4 g/dL — ABNORMAL LOW (ref 12.0–16.0)
LYMPHS PCT: 29 %
Lymphs Abs: 1.2 10*3/uL (ref 1.0–3.6)
MCH: 30.4 pg (ref 26.0–34.0)
MCHC: 32.2 g/dL (ref 32.0–36.0)
MCV: 94.6 fL (ref 80.0–100.0)
MONO ABS: 0.5 10*3/uL (ref 0.2–0.9)
MONOS PCT: 11 %
NEUTROS ABS: 2.4 10*3/uL (ref 1.4–6.5)
Neutrophils Relative %: 56 %
Platelets: 153 10*3/uL (ref 150–440)
RBC: 3.43 MIL/uL — ABNORMAL LOW (ref 3.80–5.20)
RDW: 18 % — AB (ref 11.5–14.5)
WBC: 4.2 10*3/uL (ref 3.6–11.0)

## 2017-06-14 LAB — COMPREHENSIVE METABOLIC PANEL
ALK PHOS: 140 U/L — AB (ref 38–126)
ALT: <5 U/L — ABNORMAL LOW (ref 14–54)
ANION GAP: 11 (ref 5–15)
AST: 15 U/L (ref 15–41)
Albumin: 3.6 g/dL (ref 3.5–5.0)
BUN: 54 mg/dL — ABNORMAL HIGH (ref 6–20)
CALCIUM: 8.2 mg/dL — AB (ref 8.9–10.3)
CO2: 23 mmol/L (ref 22–32)
Chloride: 107 mmol/L (ref 101–111)
Creatinine, Ser: 7.8 mg/dL — ABNORMAL HIGH (ref 0.44–1.00)
GFR calc Af Amer: 5 mL/min — ABNORMAL LOW (ref >60)
GFR calc non Af Amer: 4 mL/min — ABNORMAL LOW (ref >60)
GLUCOSE: 101 mg/dL — AB (ref 65–99)
Potassium: 4.4 mmol/L (ref 3.5–5.1)
Sodium: 141 mmol/L (ref 135–145)
Total Bilirubin: 0.6 mg/dL (ref 0.3–1.2)
Total Protein: 7.8 g/dL (ref 6.5–8.1)

## 2017-06-14 MED ORDER — HEPARIN SOD (PORK) LOCK FLUSH 100 UNIT/ML IV SOLN
500.0000 [IU] | INTRAVENOUS | Status: AC | PRN
  Administered 2017-06-14: 500 [IU]

## 2017-06-14 MED ORDER — SODIUM CHLORIDE 0.9% FLUSH
10.0000 mL | INTRAVENOUS | Status: AC | PRN
  Administered 2017-06-14: 10 mL
  Filled 2017-06-14: qty 10

## 2017-06-14 NOTE — Telephone Encounter (Signed)
Patient notified

## 2017-06-14 NOTE — Progress Notes (Signed)
Here for f/u -stated that she has had burning on urination since oct 24 ( approx ) and was told she had an"infection " but no antibiotics given. Stated still has burning on urination and smaller amts ( also on dialysis )

## 2017-06-14 NOTE — Telephone Encounter (Signed)
-----   Message from Hollice Espy, MD sent at 06/14/2017 12:58 PM EST ----- Please let Barbara [name] know her biopsy was NEGATIVE for malignancy.  Great news.  Follow up cystoscopy is already scheduled.    Hollice Espy, MD

## 2017-06-14 NOTE — Progress Notes (Signed)
[facility] OFFICE PROGRESS NOTE  Patient Care Team: [name], MD as PCP - General (Internal Medicine)  Cancer Staging No matching staging information was found for the patient.   Oncology History    #AUG 2017 TURBT- [Dr.Brandon] Large nodular proximally 5 cm bladder mass involving the left hemitrigone extending up the left lateral bladder wall, 2 discrete nodules with bridge of abnormal tissue; Tumor clinically T2. TURBT- Tumor invades muscularis propria [detrusor muscle]. NOT candidate for cystectomy.  # SEP 2017- RT- GEM weekly x2 [gem discontinued sec to poor tolerance]; Nov 2018- cysto- NEG path [Dr.Brandon]  # ESRD on HD-Dr.Singh / C.diff [aug 2017]     Cancer of trigone of urinary bladder ([place])       INTERVAL HISTORY:  Barbara [name] 76 y.o.  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   In the interim patient recently has been started on dialysis.  She has been tolerating dialysis fairly well.  Patient also had a recent cystoscopy-biopsy negative for malignancy.  She admits to have chronic mild fatigue.  Not any worse.  No nausea no vomiting.  No chest pain or shortness of breath or cough.  No fevers or chills.   REVIEW OF SYSTEMS:  A complete 10 point review of system is done which is negative except mentioned above/history of present illness.   PAST MEDICAL HISTORY :  Past Medical History:  Diagnosis Date  . Adult celiac disease   . Anemia associated with chronic renal failure 2017   blood transfusion last week 10/17  . Arthritis   . Asthma   . Cancer ([place]) 2017   bladder  . Chronic kidney disease   . 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  dr. Candiss Norse and dr. Holley Raring follow her  . COPD (chronic obstructive pulmonary disease) ([place])   . Coronary artery  disease   . Diabetes mellitus without complication ([place])   . Dialysis patient ([place])    Tues, Thurs, Sat  . Dyspnea    with exertion  . Elevated lipids   . GERD (gastroesophageal reflux disease)   . Hematuria   . Hypertension   . Lower back pain   . Neuropathy   . Oxygen dependent    at hs  . Personal history of chemotherapy   . Sleep apnea    uses cpap  . Urinary obstruction 01/2016    PAST SURGICAL HISTORY :   Past Surgical History:  Procedure Laterality Date  . ABDOMINAL HYSTERECTOMY    . AV FISTULA PLACEMENT Left 02/17/2017   Procedure: INSERTION OF ARTERIOVENOUS (AV) GORE-TEX GRAFT ARM(BRACHIAL AXILLARY);  Surgeon: Katha Cabal, MD;  Location: [facility];  Service: Vascular;  Laterality: Left;  . CYSTOSCOPY W/ RETROGRADES Bilateral 02/17/2016   Procedure: CYSTOSCOPY WITH RETROGRADE PYELOGRAM;  Surgeon: Hollice Espy, MD;  Location: [facility];  Service: Urology;  Laterality: Bilateral;  . CYSTOSCOPY W/ RETROGRADES Bilateral 10/12/2016   Procedure: CYSTOSCOPY WITH RETROGRADE PYELOGRAM;  Surgeon: Hollice Espy, MD;  Location: [facility];  Service: Urology;  Laterality: Bilateral;  . CYSTOSCOPY W/ RETROGRADES Bilateral 06/09/2017   Procedure: CYSTOSCOPY WITH RETROGRADE PYELOGRAM;  Surgeon: Hollice Espy, MD;  Location: [facility];  Service: Urology;  Laterality: Bilateral;  . CYSTOSCOPY W/ URETERAL STENT PLACEMENT Left 05/12/2016   Procedure: CYSTOSCOPY WITH STENT REPLACEMENT;  Surgeon: Hollice Espy, MD;  Location: [facility];  Service: Urology;  Laterality: Left;  . CYSTOSCOPY W/ URETERAL STENT PLACEMENT Left 10/12/2016   Procedure: CYSTOSCOPY WITH STENT REPLACEMENT;  Surgeon: Hollice Espy, MD;  Location: [facility];  Service: Urology;  Laterality: Left;  . CYSTOSCOPY WITH BIOPSY N/A 06/09/2017   Procedure: CYSTOSCOPY WITH BLADDER BIOPSY;  Surgeon: Hollice Espy, MD;  Location: [facility];  Service: Urology;  Laterality: N/A;  . CYSTOSCOPY WITH STENT PLACEMENT Left 01/21/2016    Procedure: CYSTOSCOPY WITH double J STENT PLACEMENT;  Surgeon: Franchot Gallo, MD;  Location: [facility];  Service: Urology;  Laterality: Left;  . CYSTOSCOPY WITH STENT PLACEMENT Right 10/12/2016   Procedure: CYSTOSCOPY WITH STENT PLACEMENT;  Surgeon: Hollice Espy, MD;  Location: [facility];  Service: Urology;  Laterality: Right;  . DIALYSIS/PERMA CATHETER INSERTION N/A 11/20/2016   Procedure: Dialysis/Perma Catheter Insertion;  Surgeon: Algernon Huxley, MD;  Location: [facility];  Service: Cardiovascular;  Laterality: N/A;  . DIALYSIS/PERMA CATHETER REMOVAL N/A 03/30/2017   Procedure: DIALYSIS/PERMA CATHETER REMOVAL;  Surgeon: Katha Cabal, MD;  Location: [facility];  Service: Cardiovascular;  Laterality: N/A;  . KIDNEY SURGERY  01/21/2016   IR NEPHROSTOMY PLACEMENT LEFT   . PERIPHERAL VASCULAR CATHETERIZATION N/A 04/07/2016   Procedure: Glori Luis Cath Insertion;  Surgeon: Katha Cabal, MD;  Location: [place] [facility];  Service: Cardiovascular;  Laterality: N/A;  . PERIPHERAL VASCULAR THROMBECTOMY Left 06/02/2017   Procedure: PERIPHERAL VASCULAR THROMBECTOMY;  Surgeon: Algernon Huxley, MD;  Location: [place] [facility];  Service: Cardiovascular;  Laterality: Left;  . PORTACATH PLACEMENT Right   . ROTATOR CUFF REPAIR Left   . TRANSURETHRAL RESECTION OF BLADDER TUMOR N/A 02/17/2016   Procedure: TRANSURETHRAL RESECTION OF BLADDER TUMOR (TURBT)-LARGE;  Surgeon: Hollice Espy, MD;  Location: [facility];  Service: Urology;  Laterality: N/A;  . URETEROSCOPY Left 02/17/2016   Procedure: URETEROSCOPY;  Surgeon: Hollice Espy, MD;  Location: [facility];  Service: Urology;  Laterality: Left;  . URETEROSCOPY Right 10/12/2016   Procedure: URETEROSCOPY;  Surgeon: Hollice Espy, MD;  Location: [facility];  Service: Urology;  Laterality: Right;    FAMILY HISTORY :   Family History  Problem Relation Age of [place]  . Diabetes Mother   . Cancer Father   . Breast cancer Neg Hx   . Kidney  disease Neg Hx     SOCIAL HISTORY:   Social History   Tobacco Use  . Smoking status: Former Smoker    Types: Cigarettes    Last attempt to quit: 07/20/2002    Years since quitting: 14.9  . Smokeless tobacco: Never Used  . Tobacco comment: 01/22/2016   "  quit smoking many years ago "  Substance Use Topics  . Alcohol use: No  . Drug use: No    ALLERGIES:  is allergic to glipizide er and percocet [oxycodone-acetaminophen].  MEDICATIONS:  Current Outpatient Medications  Medication Sig Dispense Refill  . ACCU-CHEK AVIVA PLUS test strip 1 each by Other route daily. use as directed  3  . ACCU-CHEK SOFTCLIX LANCETS lancets INJECT 1 EACH AS DIRECTED ONCE DAILY.  3  . albuterol (PROAIR HFA) 108 (90 Base) MCG/ACT inhaler Inhale into the lungs.    . Albuterol Sulfate (PROAIR RESPICLICK) 902 (90 Base) MCG/ACT AEPB Inhale 2 puffs into the lungs 4 (four) times daily as needed (wheezing).     Marland Kitchen atorvastatin (LIPITOR) 40 MG tablet Take 40 mg by mouth every evening.  5  . calcium acetate (PHOSLO) 667  MG capsule Take 2 capsules (1,334 mg total) by mouth 3 (three) times daily with meals. 90 capsule 0  . cholecalciferol (VITAMIN D) 1000 units tablet Take 1,000 Units by mouth daily.    . cinacalcet (SENSIPAR) 30 MG tablet Take 30 mg daily by mouth. With largest meal    . esomeprazole (NEXIUM) 40 MG capsule Take 40 mg by mouth daily after breakfast.     . esomeprazole (NEXIUM) 40 MG capsule Take by mouth.    . ferrous sulfate 325 (65 FE) MG EC tablet Take 325 mg by mouth daily after breakfast.     . furosemide (LASIX) 40 MG tablet Take 40 mg 2 (two) times daily by mouth. Take morning dose every day, take lunchtime dose as needed    . gabapentin (NEURONTIN) 300 MG capsule Take 600 mg by mouth 3 (three) times daily.    Marland Kitchen lidocaine (XYLOCAINE) 5 % ointment Apply 1 application topically daily as needed for mild pain.     Marland Kitchen lidocaine-prilocaine (EMLA) cream APPLY TO ACCESS SITE 30-40 MIN PRIOR TO DIALYSIS  TREATMENT  11  . NON FORMULARY Place 1 Units into the nose at bedtime. CPAP Time of use 2100-0600    . oxybutynin (DITROPAN) 5 MG tablet Take 5 mg by mouth 3 (three) times daily.    . OXYGEN Inhale 2 L into the lungs at bedtime.    . TRAVATAN Z 0.004 % SOLN ophthalmic solution PLACE 1 DROP INTO BOTH EYES EVERY NIGHT AT BEDTIME  5  . acetaminophen (TYLENOL) 500 MG tablet Take 500 mg by mouth every 6 (six) hours as needed for mild pain or headache.     Marland Kitchen MYRBETRIQ 25 MG TB24 tablet TAKE 1 TABLET (25 MG TOTAL) BY MOUTH DAILY. (Patient not taking: Reported on 05/25/2017) 30 tablet 5  . traMADol (ULTRAM) 50 MG tablet Take 1 tablet (50 mg total) by mouth every 12 (twelve) hours as needed. (Patient not taking: Reported on 05/25/2017) 20 tablet 0   No current facility-administered medications for this visit.     PHYSICAL EXAMINATION: ECOG PERFORMANCE STATUS: 2 - Symptomatic, <50% confined to bed  BP (!) 151/69 (BP Location: Right Arm, Patient Position: Sitting)   Pulse 70   Temp 98 F (36.7 C) (Tympanic)   Resp 18   Wt 177 lb 6.4 oz (80.5 kg)   BMI 26.97 kg/m   Filed Weights   06/14/17 1154  Weight: 177 lb 6.4 oz (80.5 kg)    GENERAL: Well-nourished well-developed; Alert, no distress and comfortable. Using a cane for ambulation.  Accompanied by her husband.  Obese. EYES: no pallor or icterus OROPHARYNX: no thrush or ulceration; good dentition  NECK: supple, no masses felt LYMPH:  no palpable lymphadenopathy in the cervical, axillary or inguinal regions LUNGS: clear to auscultation and  No wheeze or crackles HEART/CVS: regular rate & rhythm and no murmurs; No lower extremity edema ABDOMEN:abdomen soft, non-tender and normal bowel sounds Musculoskeletal:no cyanosis of digits and no clubbing  PSYCH: alert & oriented x 3 with fluent speech NEURO: no focal motor/sensory deficits SKIN:  no rashes or significant lesions  LABORATORY DATA:  I have reviewed the data as listed    Component  Value Date/Time   NA 141 06/14/2017 1105   NA 138 11/04/2016 1453   NA 138 08/07/2012 0433   K 4.4 06/14/2017 1105   K 3.2 (L) 10/30/2014 1410   CL 107 06/14/2017 1105   CL 104 08/07/2012 0433   CO2 23 06/14/2017 1105  CO2 25 08/07/2012 0433   GLUCOSE 101 (H) 06/14/2017 1105   GLUCOSE 172 (H) 08/07/2012 0433   BUN 54 (H) 06/14/2017 1105   BUN 69 (H) 11/04/2016 1453   BUN 63 (H) 08/07/2012 0433   CREATININE 7.80 (H) 06/14/2017 1105   CREATININE 2.48 (H) 08/07/2012 0433   CALCIUM 8.2 (L) 06/14/2017 1105   CALCIUM 9.1 08/07/2012 0433   PROT 7.8 06/14/2017 1105   PROT 8.9 (H) 08/02/2012 0958   ALBUMIN 3.6 06/14/2017 1105   ALBUMIN 3.9 08/02/2012 0958   AST 15 06/14/2017 1105   AST 21 08/02/2012 0958   ALT <5 (L) 06/14/2017 1105   ALT 19 08/02/2012 0958   ALKPHOS 140 (H) 06/14/2017 1105   ALKPHOS 107 08/02/2012 0958   BILITOT 0.6 06/14/2017 1105   BILITOT 0.3 08/02/2012 0958   GFRNONAA 4 (L) 06/14/2017 1105   GFRNONAA 19 (L) 08/07/2012 0433   GFRAA 5 (L) 06/14/2017 1105   GFRAA 22 (L) 08/07/2012 0433    No results found for: SPEP, UPEP  Lab Results  Component Value Date   WBC 4.2 06/14/2017   NEUTROABS 2.4 06/14/2017   HGB 10.4 (L) 06/14/2017   HCT 32.4 (L) 06/14/2017   MCV 94.6 06/14/2017   PLT 153 06/14/2017      Chemistry      Component Value Date/Time   NA 141 06/14/2017 1105   NA 138 11/04/2016 1453   NA 138 08/07/2012 0433   K 4.4 06/14/2017 1105   K 3.2 (L) 10/30/2014 1410   CL 107 06/14/2017 1105   CL 104 08/07/2012 0433   CO2 23 06/14/2017 1105   CO2 25 08/07/2012 0433   BUN 54 (H) 06/14/2017 1105   BUN 69 (H) 11/04/2016 1453   BUN 63 (H) 08/07/2012 0433   CREATININE 7.80 (H) 06/14/2017 1105   CREATININE 2.48 (H) 08/07/2012 0433      Component Value Date/Time   CALCIUM 8.2 (L) 06/14/2017 1105   CALCIUM 9.1 08/07/2012 0433   ALKPHOS 140 (H) 06/14/2017 1105   ALKPHOS 107 08/02/2012 0958   AST 15 06/14/2017 1105   AST 21 08/02/2012 0958    ALT <5 (L) 06/14/2017 1105   ALT 19 08/02/2012 0958   BILITOT 0.6 06/14/2017 1105   BILITOT 0.3 08/02/2012 0958       RADIOGRAPHIC STUDIES: I have personally reviewed the radiological images as listed and agreed with the findings in the report. No results found.   ASSESSMENT & PLAN:  Cancer of trigone of urinary bladder ([place]) # muscle invasive bladder cancer/c T2/stage II- not a candidate for cystectomy given multiple comorbidities s/p RT and weekly gemx2. Discontinued due to poor tolerance 06/2016.   # Repeat cystoscopy [id number] - neg for recurrence. Last PET scan 01/01/17 was negative for metastatic disease.  Plan a PET scan in approximately 3 months.  #anemia/hemoglobin 10.4; continue p.o. iron.  #End-stage renal disease on dialysis.   # port flush every 2 months.   # follow up in 3 months with labs; PET scan prior; port flush.    Orders Placed This Encounter  Procedures  . NM PET Image Restag (PS) Skull Base To Thigh    Standing Status:   Future    Standing Expiration Date:   06/14/2018    Order Specific Question:   If indicated for the ordered procedure, I authorize the administration of a radiopharmaceutical per Radiology protocol    Answer:   Yes    Order Specific Question:   Preferred  imaging location?    Answer:   [facility]    Order Specific Question:   Radiology Contrast Protocol - do NOT remove file path    Answer:   [email or web address]    Order Specific Question:   Reason for Exam additional comments    Answer:   bladder cancer; ESRD on HD  . CBC with Differential    Standing Status:   Future    Standing Expiration Date:   06/14/2018  . Comprehensive metabolic panel    Standing Status:   Future    Standing Expiration Date:   06/14/2018   All questions were answered. The patient knows to call the clinic with any problems, questions or concerns.      Cammie Sickle, MD 06/14/2017 11:35 PM

## 2017-06-14 NOTE — Assessment & Plan Note (Addendum)
#   muscle invasive bladder cancer/c T2/stage II- not a candidate for cystectomy given multiple comorbidities s/p RT and weekly gemx2. Discontinued due to poor tolerance 06/2016.   # Repeat cystoscopy [id number] - neg for recurrence. Last PET scan 01/01/17 was negative for metastatic disease.  Plan a PET scan in approximately 3 months.  #anemia/hemoglobin 10.4; continue p.o. iron.  #End-stage renal disease on dialysis.   # port flush every 2 months.   # follow up in 3 months with labs; PET scan prior; port flush.

## 2017-06-17 ENCOUNTER — Ambulatory Visit (INDEPENDENT_AMBULATORY_CARE_PROVIDER_SITE_OTHER): Payer: Medicare Other | Admitting: Podiatry

## 2017-06-17 ENCOUNTER — Encounter: Payer: Self-pay | Admitting: Podiatry

## 2017-06-17 VITALS — BP 125/60 | HR 78

## 2017-06-17 DIAGNOSIS — M79675 Pain in left toe(s): Secondary | ICD-10-CM

## 2017-06-17 DIAGNOSIS — E1142 Type 2 diabetes mellitus with diabetic polyneuropathy: Secondary | ICD-10-CM | POA: Diagnosis not present

## 2017-06-17 DIAGNOSIS — M79674 Pain in right toe(s): Secondary | ICD-10-CM | POA: Diagnosis not present

## 2017-06-17 DIAGNOSIS — B351 Tinea unguium: Secondary | ICD-10-CM

## 2017-06-17 NOTE — Progress Notes (Signed)
   Subjective:    Patient ID: Barbara [name], female    DOB: 11-07-1940, 76 y.o.   MRN: [id number]  HPIthis patient presents the office with chief complaint of long thick painful nails.  Patient states the nails are painful walking and wearing her shoes.  She also says she has had an injury to the big toenail on the left foot.  This nail is unattached from the nail bed.  Patient is a poor historian and I could not have her describe the process that caused the nail to become unattached.  Patient is unable to self treat.  This patient is diabetic, for which she is not taking any medication except gabapentin.  She presents the office today for an evaluation and treatment of her painful nails    Review of Systems  Constitutional: Positive for unexpected weight change.  HENT: Positive for hearing loss, sinus pressure and sinus pain.   Respiratory: Positive for shortness of breath.        Difficulty breathing  Musculoskeletal: Positive for gait problem and myalgias.       Objective:   Physical Exam General Appearance  Alert, conversant and in no acute stress.  Vascular  Dorsalis pedis and posterior pulses are palpable  bilaterally.  Capillary return is within normal limits  Bilaterally. Temperature is within normal limits  Bilaterally  Neurologic  Senn-Weinstein monofilament wire test diminished   bilaterally. Muscle power  Within normal limits bilaterally.  Nails Thick disfigured discolored nails with subungual debride bilaterally from hallux to fifth toes bilaterally. No evidence of bacterial infection or drainage bilaterally.  Orthopedic  No limitations of motion of motion feet bilaterally.  No crepitus or effusions noted.  No bony pathology or digital deformities noted.  Skin  normotropic skin with no porokeratosis noted bilaterally.  No signs of infections or ulcers noted.          Assessment & Plan:  Onychomycosis  B/l  Diabetes with neuropathy.   IE  Debridement of nails  X 10.   RTC 3 months.   Gardiner Barefoot DPM

## 2017-07-21 ENCOUNTER — Ambulatory Visit (INDEPENDENT_AMBULATORY_CARE_PROVIDER_SITE_OTHER): Payer: Medicare Other

## 2017-07-21 ENCOUNTER — Encounter (INDEPENDENT_AMBULATORY_CARE_PROVIDER_SITE_OTHER): Payer: Self-pay | Admitting: Vascular Surgery

## 2017-07-21 ENCOUNTER — Ambulatory Visit (INDEPENDENT_AMBULATORY_CARE_PROVIDER_SITE_OTHER): Payer: Medicare Other | Admitting: Vascular Surgery

## 2017-07-21 VITALS — BP 112/64 | HR 70 | Resp 16 | Wt 177.0 lb

## 2017-07-21 DIAGNOSIS — N186 End stage renal disease: Secondary | ICD-10-CM

## 2017-07-21 DIAGNOSIS — E118 Type 2 diabetes mellitus with unspecified complications: Secondary | ICD-10-CM | POA: Diagnosis not present

## 2017-07-21 NOTE — Progress Notes (Signed)
Subjective:    Patient ID: Barbara [name], female    DOB: [birth year], 77 y.o.   MRN: [id number] Chief Complaint  Patient presents with  . Follow-up    [facility] 6wk HDA   Patient presents for her first post procedure follow-up.  Patient is status post a left upper extremity shuntogram on November 14 for a thrombosed left brachial artery to axillary vein arteriovenous graft.  The patient presents today without complaint. The patient underwent a duplex ultrasound of the AV access which was notable for a patent graft without any significant hemodynamic stenosis. Hemodialysis doppler flow is 873. The patient denies any issues with hemodialysis such as cannulation problems, increased bleeding, decrease in doppler flow or recirculation. The patient also denies any graft skin breakdown, pain, edema, pallor or ulceration of the arm / hand.  Patient denies any uremic symptoms.  Patient denies any fever, nausea vomiting.   Review of Systems  Constitutional: Negative.   HENT: Negative.   Eyes: Negative.   Respiratory: Negative.   Cardiovascular: Negative.   Gastrointestinal: Negative.   Endocrine: Negative.   Genitourinary: Negative.   Musculoskeletal: Negative.   Skin: Negative.   Allergic/Immunologic: Negative.   Neurological: Negative.   Hematological: Negative.   Psychiatric/Behavioral: Negative.       Objective:   Physical Exam  Constitutional: She is oriented to person, place, and time. She appears well-developed and well-nourished. No distress.  HENT:  Head: Normocephalic and atraumatic.  Eyes: Conjunctivae are normal. Pupils are equal, round, and reactive to light.  Neck: Normal range of motion.  Cardiovascular: Normal rate, regular rhythm, normal heart sounds and intact distal pulses.  Pulses:      Radial pulses are 2+ on the right side, and 2+ on the left side.  Left upper extremity: Good bruit and thrill.  Skin is intact.  Pulmonary/Chest: Effort normal and breath sounds normal.    Musculoskeletal: Normal range of motion. She exhibits no edema.  Neurological: She is alert and oriented to person, place, and time.  Skin: Skin is warm and dry. She is not diaphoretic.  Psychiatric: She has a normal mood and affect. Her behavior is normal. Judgment and thought content normal.  Vitals reviewed.  BP 112/64 (BP Location: Right Arm)   Pulse 70   Resp 16   Wt 177 lb (80.3 kg)   BMI 26.91 kg/m   Past Medical History:  Diagnosis Date  . Adult celiac disease   . Anemia associated with chronic renal failure 2017   blood transfusion last week 10/17  . Arthritis   . Asthma   . 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 Chronic kidney disease   . 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  dr. Candiss Norse and dr. Holley Raring follow her  . COPD (chronic obstructive pulmonary disease) ([place])   . Coronary artery disease   . Diabetes mellitus without complication ([place])   . Dialysis patient ([place])    Tues, Thurs, Sat  . Dyspnea    with exertion  . Elevated lipids   . GERD (gastroesophageal reflux disease)   . Hematuria   . Hypertension   . Lower back pain   . Neuropathy   . Oxygen dependent    at hs  . Personal history of chemotherapy   . Sleep apnea    uses cpap  . Urinary obstruction 01/2016   Social History   Socioeconomic History  . Marital status: Married    Spouse name: Not  on file  . Number of children: Not on file  . Years of education: Not on file  . Highest education level: Not on file  Social Needs  . Financial resource strain: Not on file  . Food insecurity - worry: Not on file  . Food insecurity - inability: Not on file  . Transportation needs - medical: Not on file  . Transportation needs - non-medical: Not on file  Occupational History  . Occupation: retired  Tobacco Use  . Smoking status: Former Smoker    Types: Cigarettes    Last attempt to quit: 07/20/2002    Years since quitting: 15.0  . Smokeless tobacco: Never Used  . Tobacco comment:  01/22/2016   "  quit smoking many years ago "  Substance and Sexual Activity  . Alcohol use: No  . Drug use: No  . Sexual activity: No  Other Topics Concern  . Not on file  Social History Narrative  . Not on file   Past Surgical History:  Procedure Laterality Date  . ABDOMINAL HYSTERECTOMY    . AV FISTULA PLACEMENT Left 02/17/2017   Procedure: INSERTION OF ARTERIOVENOUS (AV) GORE-TEX GRAFT ARM(BRACHIAL AXILLARY);  Surgeon: Katha Cabal, MD;  Location: [facility];  Service: Vascular;  Laterality: Left;  . CYSTOSCOPY W/ RETROGRADES Bilateral 02/17/2016   Procedure: CYSTOSCOPY WITH RETROGRADE PYELOGRAM;  Surgeon: Hollice Espy, MD;  Location: [facility];  Service: Urology;  Laterality: Bilateral;  . CYSTOSCOPY W/ RETROGRADES Bilateral 10/12/2016   Procedure: CYSTOSCOPY WITH RETROGRADE PYELOGRAM;  Surgeon: Hollice Espy, MD;  Location: [facility];  Service: Urology;  Laterality: Bilateral;  . CYSTOSCOPY W/ RETROGRADES Bilateral 06/09/2017   Procedure: CYSTOSCOPY WITH RETROGRADE PYELOGRAM;  Surgeon: Hollice Espy, MD;  Location: [facility];  Service: Urology;  Laterality: Bilateral;  . CYSTOSCOPY W/ URETERAL STENT PLACEMENT Left 05/12/2016   Procedure: CYSTOSCOPY WITH STENT REPLACEMENT;  Surgeon: Hollice Espy, MD;  Location: [facility];  Service: Urology;  Laterality: Left;  . CYSTOSCOPY W/ URETERAL STENT PLACEMENT Left 10/12/2016   Procedure: CYSTOSCOPY WITH STENT REPLACEMENT;  Surgeon: Hollice Espy, MD;  Location: [facility];  Service: Urology;  Laterality: Left;  . CYSTOSCOPY WITH BIOPSY N/A 06/09/2017   Procedure: CYSTOSCOPY WITH BLADDER BIOPSY;  Surgeon: Hollice Espy, MD;  Location: [facility];  Service: Urology;  Laterality: N/A;  . CYSTOSCOPY WITH STENT PLACEMENT Left 01/21/2016   Procedure: CYSTOSCOPY WITH double J STENT PLACEMENT;  Surgeon: Franchot Gallo, MD;  Location: [facility];  Service: Urology;  Laterality: Left;  . CYSTOSCOPY WITH STENT PLACEMENT Right 10/12/2016   Procedure:  CYSTOSCOPY WITH STENT PLACEMENT;  Surgeon: Hollice Espy, MD;  Location: [facility];  Service: Urology;  Laterality: Right;  . DIALYSIS/PERMA CATHETER INSERTION N/A 11/20/2016   Procedure: Dialysis/Perma Catheter Insertion;  Surgeon: Algernon Huxley, MD;  Location: [place] [facility];  Service: Cardiovascular;  Laterality: N/A;  . DIALYSIS/PERMA CATHETER REMOVAL N/A 03/30/2017   Procedure: DIALYSIS/PERMA CATHETER REMOVAL;  Surgeon: Katha Cabal, MD;  Location: [place] [facility];  Service: Cardiovascular;  Laterality: N/A;  . KIDNEY SURGERY  01/21/2016   IR NEPHROSTOMY PLACEMENT LEFT   . PERIPHERAL VASCULAR CATHETERIZATION N/A 04/07/2016   Procedure: Glori Luis Cath Insertion;  Surgeon: Katha Cabal, MD;  Location: [place] [facility];  Service: Cardiovascular;  Laterality: N/A;  . PERIPHERAL VASCULAR THROMBECTOMY Left 06/02/2017   Procedure: PERIPHERAL VASCULAR THROMBECTOMY;  Surgeon: Algernon Huxley, MD;  Location: [place] [facility];  Service: Cardiovascular;  Laterality: Left;  . PORTACATH PLACEMENT  Right   . ROTATOR CUFF REPAIR Left   . TRANSURETHRAL RESECTION OF BLADDER TUMOR N/A 02/17/2016   Procedure: TRANSURETHRAL RESECTION OF BLADDER TUMOR (TURBT)-LARGE;  Surgeon: Hollice Espy, MD;  Location: [facility];  Service: Urology;  Laterality: N/A;  . URETEROSCOPY Left 02/17/2016   Procedure: URETEROSCOPY;  Surgeon: Hollice Espy, MD;  Location: [facility];  Service: Urology;  Laterality: Left;  . URETEROSCOPY Right 10/12/2016   Procedure: URETEROSCOPY;  Surgeon: Hollice Espy, MD;  Location: [facility];  Service: Urology;  Laterality: Right;   Family History  Problem Relation Age of Onset  . Diabetes Mother   . Cancer Father   . Breast cancer Neg Hx   . Kidney disease Neg Hx    Allergies  Allergen Reactions  . Glipizide Er     Dropped blood sugar too much  . Percocet [Oxycodone-Acetaminophen] Hives      Assessment & Plan:  Patient presents for her first post procedure follow-up.   Patient is status post a left upper extremity shuntogram on November 14 for a thrombosed left brachial artery to axillary vein arteriovenous graft.  The patient presents today without complaint. The patient underwent a duplex ultrasound of the AV access which was notable for a patent graft without any significant hemodynamic stenosis. Hemodialysis doppler flow is 873. The patient denies any issues with hemodialysis such as cannulation problems, increased bleeding, decrease in doppler flow or recirculation. The patient also denies any graft skin breakdown, pain, edema, pallor or ulceration of the arm / hand.  Patient denies any uremic symptoms.  Patient denies any fever, nausea vomiting.  1. End stage renal disease ([place]) - Stable Studies reviewed with patient. The patient is doing well and currently has adequate dialysis access status post recent intervention. Duplex ultrasound of the AV access shows a patent access with no evidence of hemodynamically significant strictures or stenosis.  The patient should continue to have duplex ultrasounds of the dialysis access every six months. The patient was instructed to call the office in the interim if any issues with dialysis access / doppler flow, pain, edema, pallor, fistula skin breakdown or ulceration of the arm / hand occur. The patient expressed their understanding.  - VAS US DUPLEX DIALYSIS ACCESS (AVF,AVG); Future  2. Type 2 diabetes mellitus with complication, unspecified whether long term insulin use ([facility]) - Stable Encouraged good control as its slows the progression of atherosclerotic disease  Current Outpatient Medications on File Prior to Visit  Medication Sig Dispense Refill  . ACCU-CHEK AVIVA PLUS test strip 1 each by Other route daily. use as directed  3  . ACCU-CHEK SOFTCLIX LANCETS lancets INJECT 1 EACH AS DIRECTED ONCE DAILY.  3  . acetaminophen (TYLENOL) 500 MG tablet Take 500 mg by mouth every 6 (six) hours as needed for mild pain or  headache.     . albuterol (PROAIR HFA) 108 (90 Base) MCG/ACT inhaler Inhale into the lungs.    . Albuterol Sulfate (PROAIR RESPICLICK) 277 (90 Base) MCG/ACT AEPB Inhale 2 puffs into the lungs 4 (four) times daily as needed (wheezing).     Marland Kitchen atorvastatin (LIPITOR) 40 MG tablet Take 40 mg by mouth every evening.  5  . calcium acetate (PHOSLO) 667 MG capsule Take 2 capsules (1,334 mg total) by mouth 3 (three) times daily with meals. 90 capsule 0  . cholecalciferol (VITAMIN D) 1000 units tablet Take 1,000 Units by mouth daily.    . cinacalcet (SENSIPAR) 30 MG tablet Take 30 mg daily by mouth.  With largest meal    . esomeprazole (NEXIUM) 40 MG capsule Take 40 mg by mouth daily after breakfast.     . esomeprazole (NEXIUM) 40 MG capsule Take by mouth.    . ferrous sulfate 325 (65 FE) MG EC tablet Take 325 mg by mouth daily after breakfast.     . furosemide (LASIX) 40 MG tablet Take 40 mg 2 (two) times daily by mouth. Take morning dose every day, take lunchtime dose as needed    . gabapentin (NEURONTIN) 300 MG capsule Take 600 mg by mouth 3 (three) times daily.    Marland Kitchen lidocaine (XYLOCAINE) 5 % ointment Apply 1 application topically daily as needed for mild pain.     Marland Kitchen lidocaine-prilocaine (EMLA) cream APPLY TO ACCESS SITE 30-40 MIN PRIOR TO DIALYSIS TREATMENT  11  . MYRBETRIQ 25 MG TB24 tablet TAKE 1 TABLET (25 MG TOTAL) BY MOUTH DAILY. 30 tablet 5  . NON FORMULARY Place 1 Units into the nose at bedtime. CPAP Time of use 2100-0600    . oxybutynin (DITROPAN) 5 MG tablet Take 5 mg by mouth 3 (three) times daily.    . OXYGEN Inhale 2 L into the lungs at bedtime.    . traMADol (ULTRAM) 50 MG tablet Take 1 tablet (50 mg total) by mouth every 12 (twelve) hours as needed. 20 tablet 0  . TRAVATAN Z 0.004 % SOLN ophthalmic solution PLACE 1 DROP INTO BOTH EYES EVERY NIGHT AT BEDTIME  5   No current facility-administered medications on file prior to visit.    There are no Patient Instructions on file for this  visit. No Follow-up on file.  Gaelen Brager A Mikeria Valin, PA-C

## 2017-07-30 DIAGNOSIS — Z860100 Personal history of colon polyps, unspecified: Secondary | ICD-10-CM | POA: Insufficient documentation

## 2017-08-03 ENCOUNTER — Other Ambulatory Visit: Payer: Self-pay | Admitting: Internal Medicine

## 2017-09-03 ENCOUNTER — Encounter: Payer: Self-pay | Admitting: Emergency Medicine

## 2017-09-03 ENCOUNTER — Emergency Department: Payer: Medicare Other

## 2017-09-03 ENCOUNTER — Other Ambulatory Visit: Payer: Self-pay

## 2017-09-03 ENCOUNTER — Emergency Department
Admission: EM | Admit: 2017-09-03 | Discharge: 2017-09-03 | Disposition: A | Discharge status: Home / Self Care | Payer: Medicare Other | Attending: Emergency Medicine | Admitting: Emergency Medicine

## 2017-09-03 DIAGNOSIS — Z79899 Other long term (current) drug therapy: Secondary | ICD-10-CM | POA: Diagnosis not present

## 2017-09-03 DIAGNOSIS — Z992 Dependence on renal dialysis: Secondary | ICD-10-CM | POA: Diagnosis not present

## 2017-09-03 DIAGNOSIS — R109 Unspecified abdominal pain: Secondary | ICD-10-CM

## 2017-09-03 DIAGNOSIS — N186 End stage renal disease: Secondary | ICD-10-CM | POA: Diagnosis not present

## 2017-09-03 DIAGNOSIS — N1330 Unspecified hydronephrosis: Secondary | ICD-10-CM

## 2017-09-03 DIAGNOSIS — Z87891 Personal history of nicotine dependence: Secondary | ICD-10-CM | POA: Diagnosis not present

## 2017-09-03 DIAGNOSIS — J449 Chronic obstructive pulmonary disease, unspecified: Secondary | ICD-10-CM | POA: Diagnosis not present

## 2017-09-03 DIAGNOSIS — N390 Urinary tract infection, site not specified: Secondary | ICD-10-CM

## 2017-09-03 DIAGNOSIS — E1122 Type 2 diabetes mellitus with diabetic chronic kidney disease: Secondary | ICD-10-CM | POA: Insufficient documentation

## 2017-09-03 DIAGNOSIS — R1084 Generalized abdominal pain: Secondary | ICD-10-CM | POA: Diagnosis present

## 2017-09-03 DIAGNOSIS — I120 Hypertensive chronic kidney disease with stage 5 chronic kidney disease or end stage renal disease: Secondary | ICD-10-CM | POA: Diagnosis not present

## 2017-09-03 LAB — CBC
HCT: 40.2 % (ref 35.0–47.0)
Hemoglobin: 13.1 g/dL (ref 12.0–16.0)
MCH: 31.4 pg (ref 26.0–34.0)
MCHC: 32.6 g/dL (ref 32.0–36.0)
MCV: 96.4 fL (ref 80.0–100.0)
PLATELETS: 129 10*3/uL — AB (ref 150–440)
RBC: 4.18 MIL/uL (ref 3.80–5.20)
RDW: 15.5 % — ABNORMAL HIGH (ref 11.5–14.5)
WBC: 7.5 10*3/uL (ref 3.6–11.0)

## 2017-09-03 LAB — COMPREHENSIVE METABOLIC PANEL
ALT: 12 U/L — AB (ref 14–54)
AST: 18 U/L (ref 15–41)
Albumin: 4.1 g/dL (ref 3.5–5.0)
Alkaline Phosphatase: 167 U/L — ABNORMAL HIGH (ref 38–126)
Anion gap: 12 (ref 5–15)
BILIRUBIN TOTAL: 0.6 mg/dL (ref 0.3–1.2)
BUN: 35 mg/dL — ABNORMAL HIGH (ref 6–20)
CALCIUM: 8.3 mg/dL — AB (ref 8.9–10.3)
CO2: 23 mmol/L (ref 22–32)
CREATININE: 6.01 mg/dL — AB (ref 0.44–1.00)
Chloride: 102 mmol/L (ref 101–111)
GFR, EST AFRICAN AMERICAN: 7 mL/min — AB (ref >60)
GFR, EST NON AFRICAN AMERICAN: 6 mL/min — AB (ref >60)
Glucose, Bld: 118 mg/dL — ABNORMAL HIGH (ref 65–99)
Potassium: 4.3 mmol/L (ref 3.5–5.1)
Sodium: 137 mmol/L (ref 135–145)
Total Protein: 8.7 g/dL — ABNORMAL HIGH (ref 6.5–8.1)

## 2017-09-03 LAB — URINALYSIS, COMPLETE (UACMP) WITH MICROSCOPIC
Bacteria, UA: NONE SEEN
Bilirubin Urine: NEGATIVE
GLUCOSE, UA: NEGATIVE mg/dL
KETONES UR: NEGATIVE mg/dL
Nitrite: NEGATIVE
PH: 8 (ref 5.0–8.0)
Protein, ur: 100 mg/dL — AB
Specific Gravity, Urine: 1.01 (ref 1.005–1.030)

## 2017-09-03 LAB — LIPASE, BLOOD: Lipase: 30 U/L (ref 11–51)

## 2017-09-03 MED ORDER — HEPARIN SOD (PORK) LOCK FLUSH 100 UNIT/ML IV SOLN
INTRAVENOUS | Status: AC
  Filled 2017-09-03: qty 5

## 2017-09-03 MED ORDER — CEPHALEXIN 500 MG PO CAPS: 500.0000 mg | ORAL_CAPSULE | Freq: Every day | ORAL | 0 refills | Status: DC

## 2017-09-03 MED ORDER — HYDROMORPHONE HCL 1 MG/ML IJ SOLN
0.5000 mg | INTRAMUSCULAR | Status: AC
  Administered 2017-09-03: 0.5 mg via INTRAVENOUS
  Filled 2017-09-03: qty 1

## 2017-09-03 MED ORDER — ONDANSETRON HCL 4 MG/2ML IJ SOLN
4.0000 mg | Freq: Once | INTRAMUSCULAR | Status: AC | PRN
  Administered 2017-09-03: 4 mg via INTRAVENOUS
  Filled 2017-09-03: qty 2

## 2017-09-03 MED ORDER — CEPHALEXIN 500 MG PO CAPS
500.0000 mg | ORAL_CAPSULE | Freq: Once | ORAL | Status: AC
  Administered 2017-09-03: 500 mg via ORAL

## 2017-09-03 NOTE — ED Provider Notes (Signed)
[facility] Emergency Department Provider Note   ____________________________________________   First MD Initiated Contact with Patient 09/03/17 1058     (approximate)  I have reviewed the triage vital signs and the nursing notes.   HISTORY  Chief Complaint Abdominal Pain    HPI Barbara [name] is a 77 y.o. female notable history of chronic kidney disease on dialysis, COPD, as well as bladder cancer followed by urology with previous stents  Patient reports that for the last day to day and a half she been experiencing pain in her left flank.  This morning she went to dialysis, she felt she needed to urinate and as she attempted to urinate she noticed a small spot of blood and began experiencing significant pain worsening in the left flank.  She went to dialysis and reports she made it through about half of her treatment before she had to come to the ER for further evaluation due to intractable left flank pain  She has had previous surgeries and stents removed on the left, she reports that   Past Medical History:  Diagnosis Date  . Adult celiac disease   . Anemia associated with chronic renal failure 2017   blood transfusion last week 10/17  . Arthritis   . Asthma   . Cancer ([place]) 2017   bladder  . Chronic kidney disease   . 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  dr. Candiss Norse and dr. Holley Raring follow her  . COPD (chronic obstructive pulmonary disease) ([place])   . Coronary artery disease   . Diabetes mellitus without complication ([place])   . Dialysis patient ([place])    Tues, Thurs, Sat  . Dyspnea    with exertion  . Elevated lipids   . GERD (gastroesophageal reflux disease)   . Hematuria   . Hypertension   . Lower back pain   . Neuropathy   . Oxygen dependent    at hs  . Personal history of chemotherapy   . Sleep apnea    uses cpap  . Urinary obstruction 01/2016    Patient Active Problem List   Diagnosis Date Noted  . Presence of  cardiac and vascular implant and graft   . Fever   . Sepsis ([place])   . 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 Sepsis secondary to UTI ([place]) 01/24/2017  . Acute kidney injury ([place]) 11/19/2016  . Anemia, chronic renal failure, stage 4 (severe) ([place]) 09/17/2016  . Hypoglycemia 06/04/2016  . Hyperkalemia 05/12/2016  . Protein-calorie malnutrition, severe 05/05/2016  . Acute on chronic renal failure ([place]) 05/04/2016  . Seizure ([place]) 04/17/2016  . Palliative care by specialist   . DNR (do not resuscitate) discussion   . C. difficile diarrhea 03/11/2016  . Anemia 03/11/2016  . Hypotension 03/11/2016  . Acidosis 03/11/2016  . Failure to thrive (child) 03/11/2016  . Weakness generalized 03/11/2016  . ARF (acute renal failure) ([place]) 03/04/2016  . Cancer of trigone of urinary bladder ([place]) 03/02/2016  . Absolute anemia 02/04/2016  . Airway hyperreactivity 02/04/2016  . Celiac disease 02/04/2016  . Gastric catarrh 02/04/2016  . Acid reflux 02/04/2016  . Combined fat and carbohydrate induced hyperlipemia 02/04/2016  . C. difficile colitis 01/22/2016  . Urinary obstruction 01/21/2016  . COPD (chronic obstructive pulmonary disease) ([place]) 01/21/2016  . Controlled type 2 diabetes mellitus with stage 4 chronic kidney disease, with long-term current use of insulin ([place]) 01/21/2016  . Essential hypertension 01/21/2016  . Acute  renal failure superimposed on stage 4 chronic kidney disease ([place]) 01/20/2016  . Anemia in chronic kidney disease 01/20/2016  . Arthritis of knee, degenerative 05/10/2015  . Knee strain 03/26/2015  . Other intervertebral disc displacement, lumbar region 03/04/2015  . Degenerative arthritis of lumbar spine 03/04/2015  . HNP (herniated nucleus pulposus), lumbar 03/04/2015  . Osteoarthritis of spine with radiculopathy, lumbar region 03/04/2015  . Injury of tendon of upper extremity 11/12/2014  . Atherosclerosis of abdominal aorta ([place]) 11/02/2014  . Chronic kidney  disease, stage IV (severe) ([place]) 11/02/2014  . Obstructive apnea 11/02/2014  . Complete rotator cuff rupture of left shoulder 10/05/2014  . Arthritis of shoulder region, degenerative 10/05/2014  . CAD in native artery 12/08/2013  . Benign essential HTN 12/08/2013  . Type 2 diabetes mellitus ([place]) 12/08/2013    Past Surgical History:  Procedure Laterality Date  . ABDOMINAL HYSTERECTOMY    . AV FISTULA PLACEMENT Left 02/17/2017   Procedure: INSERTION OF ARTERIOVENOUS (AV) GORE-TEX GRAFT ARM(BRACHIAL AXILLARY);  Surgeon: Katha Cabal, MD;  Location: [facility];  Service: Vascular;  Laterality: Left;  . CYSTOSCOPY W/ RETROGRADES Bilateral 02/17/2016   Procedure: CYSTOSCOPY WITH RETROGRADE PYELOGRAM;  Surgeon: Hollice Espy, MD;  Location: [facility];  Service: Urology;  Laterality: Bilateral;  . CYSTOSCOPY W/ RETROGRADES Bilateral 10/12/2016   Procedure: CYSTOSCOPY WITH RETROGRADE PYELOGRAM;  Surgeon: Hollice Espy, MD;  Location: [facility];  Service: Urology;  Laterality: Bilateral;  . CYSTOSCOPY W/ RETROGRADES Bilateral 06/09/2017   Procedure: CYSTOSCOPY WITH RETROGRADE PYELOGRAM;  Surgeon: Hollice Espy, MD;  Location: [facility];  Service: Urology;  Laterality: Bilateral;  . CYSTOSCOPY W/ URETERAL STENT PLACEMENT Left 05/12/2016   Procedure: CYSTOSCOPY WITH STENT REPLACEMENT;  Surgeon: Hollice Espy, MD;  Location: [facility];  Service: Urology;  Laterality: Left;  . CYSTOSCOPY W/ URETERAL STENT PLACEMENT Left 10/12/2016   Procedure: CYSTOSCOPY WITH STENT REPLACEMENT;  Surgeon: Hollice Espy, MD;  Location: [facility];  Service: Urology;  Laterality: Left;  . CYSTOSCOPY WITH BIOPSY N/A 06/09/2017   Procedure: CYSTOSCOPY WITH BLADDER BIOPSY;  Surgeon: Hollice Espy, MD;  Location: [facility];  Service: Urology;  Laterality: N/A;  . CYSTOSCOPY WITH STENT PLACEMENT Left 01/21/2016   Procedure: CYSTOSCOPY WITH double J STENT PLACEMENT;  Surgeon: Franchot Gallo, MD;  Location: [facility];  Service:  Urology;  Laterality: Left;  . CYSTOSCOPY WITH STENT PLACEMENT Right 10/12/2016   Procedure: CYSTOSCOPY WITH STENT PLACEMENT;  Surgeon: Hollice Espy, MD;  Location: [facility];  Service: Urology;  Laterality: Right;  . DIALYSIS/PERMA CATHETER INSERTION N/A 11/20/2016   Procedure: Dialysis/Perma Catheter Insertion;  Surgeon: Algernon Huxley, MD;  Location: [place] [facility];  Service: Cardiovascular;  Laterality: N/A;  . DIALYSIS/PERMA CATHETER REMOVAL N/A 03/30/2017   Procedure: DIALYSIS/PERMA CATHETER REMOVAL;  Surgeon: Katha Cabal, MD;  Location: [place] [facility];  Service: Cardiovascular;  Laterality: N/A;  . KIDNEY SURGERY  01/21/2016   IR NEPHROSTOMY PLACEMENT LEFT   . PERIPHERAL VASCULAR CATHETERIZATION N/A 04/07/2016   Procedure: Glori Luis Cath Insertion;  Surgeon: Katha Cabal, MD;  Location: [place] [facility];  Service: Cardiovascular;  Laterality: N/A;  . PERIPHERAL VASCULAR THROMBECTOMY Left 06/02/2017   Procedure: PERIPHERAL VASCULAR THROMBECTOMY;  Surgeon: Algernon Huxley, MD;  Location: [place] [facility];  Service: Cardiovascular;  Laterality: Left;  . PORTACATH PLACEMENT Right   . ROTATOR CUFF REPAIR Left   . TRANSURETHRAL RESECTION OF BLADDER TUMOR N/A 02/17/2016   Procedure: TRANSURETHRAL RESECTION OF BLADDER TUMOR (TURBT)-LARGE;  Surgeon: Caryl Pina  Erlene Quan, MD;  Location: [facility];  Service: Urology;  Laterality: N/A;  . URETEROSCOPY Left 02/17/2016   Procedure: URETEROSCOPY;  Surgeon: Hollice Espy, MD;  Location: [facility];  Service: Urology;  Laterality: Left;  . URETEROSCOPY Right 10/12/2016   Procedure: URETEROSCOPY;  Surgeon: Hollice Espy, MD;  Location: [facility];  Service: Urology;  Laterality: Right;    Prior to Admission medications   Medication Sig Start Date End Date Taking? Authorizing Provider  ACCU-CHEK AVIVA PLUS test strip 1 each by Other route daily. use as directed 01/26/17   [provider]  ACCU-CHEK SOFTCLIX LANCETS lancets INJECT 1 EACH  AS DIRECTED ONCE DAILY. 05/28/17   [provider]  acetaminophen (TYLENOL) 500 MG tablet Take 500 mg by mouth every 6 (six) hours as needed for mild pain or headache.     [provider]  albuterol (PROAIR HFA) 108 (90 Base) MCG/ACT inhaler Inhale into the lungs. 12/02/16   [provider]  Albuterol Sulfate (PROAIR RESPICLICK) 233 (90 Base) MCG/ACT AEPB Inhale 2 puffs into the lungs 4 (four) times daily as needed (wheezing).     [provider]  atorvastatin (LIPITOR) 40 MG tablet Take 40 mg by mouth every evening. 11/01/16   [provider]  calcium acetate (PHOSLO) 667 MG capsule Take 2 capsules (1,334 mg total) by mouth 3 (three) times daily with meals. 11/23/16   Fritzi Mandes, MD  cephALEXin (KEFLEX) 500 MG capsule Take 1 capsule (500 mg total) by mouth daily. 09/03/17   Delman Kitten, MD  cholecalciferol (VITAMIN D) 1000 units tablet Take 1,000 Units by mouth daily.    [provider]  cinacalcet (SENSIPAR) 30 MG tablet Take 30 mg daily by mouth. With largest meal    [provider]  esomeprazole (NEXIUM) 40 MG capsule Take 40 mg by mouth daily after breakfast.     [provider]  esomeprazole (NEXIUM) 40 MG capsule Take by mouth.    [provider]  ferrous sulfate 325 (65 FE) MG EC tablet Take 325 mg by mouth daily after breakfast.     [provider]  furosemide (LASIX) 40 MG tablet Take 40 mg 2 (two) times daily by mouth. Take morning dose every day, take lunchtime dose as needed    [provider]  gabapentin (NEURONTIN) 300 MG capsule Take 600 mg by mouth 3 (three) times daily.    [provider]  lidocaine (XYLOCAINE) 5 % ointment Apply 1 application topically daily as needed for mild pain.  05/21/16   [provider]  lidocaine-prilocaine (EMLA) cream APPLY TO ACCESS SITE 30-40 MIN PRIOR TO DIALYSIS TREATMENT 03/02/17   [provider]  MYRBETRIQ 25 MG TB24 tablet TAKE 1  TABLET (25 MG TOTAL) BY MOUTH DAILY. 06/10/16   Noreene Filbert, MD  NON FORMULARY Place 1 Units into the nose at bedtime. CPAP Time of use 2100-0600    [provider]  oxybutynin (DITROPAN) 5 MG tablet Take 5 mg by mouth 3 (three) times daily.    [provider]  OXYGEN Inhale 2 L into the lungs at bedtime.    [provider]  traMADol (ULTRAM) 50 MG tablet Take 1 tablet (50 mg total) by mouth every 12 (twelve) hours as needed. 05/11/17 05/11/18  Earleen Newport, MD  TRAVATAN Z 0.004 % SOLN ophthalmic solution PLACE 1 DROP INTO BOTH EYES EVERY NIGHT AT BEDTIME 07/12/16   [provider]    Allergies Glipizide er and Percocet [oxycodone-acetaminophen]  Family History  Problem Relation Age of Onset  . Diabetes Mother   . Cancer Father   . Breast cancer Neg Hx   . Kidney disease Neg Hx     Social History Social History   Tobacco Use  . Smoking status: Former Smoker    Types: Cigarettes    Last attempt to quit: 07/20/2002    Years since quitting: 15.1  . Smokeless tobacco: Never Used  . Tobacco comment: 01/22/2016   "  quit smoking many years ago "  Substance Use Topics  . Alcohol use: No  . Drug use: No    Review of Systems Constitutional: No fever/chills Eyes: No visual changes. ENT: No sore throat. Cardiovascular: Denies chest pain. Respiratory: Denies shortness of breath. Gastrointestinal:   No nausea, no vomiting.  No diarrhea.  No constipation. Genitourinary: Some pain with urination today, also noticed some blood noted in her urine with an urge to urinate this morning. Musculoskeletal: Negative for back pain. Skin: Negative for rash. Neurological: Negative for headaches, focal weakness or numbness.    ____________________________________________   PHYSICAL EXAM:  VITAL SIGNS: ED Triage Vitals  Enc Vitals Group     BP 09/03/17 0935 (!) 197/64     Pulse Rate 09/03/17 0935 90     Resp 09/03/17 0935 16     Temp  09/03/17 0935 98.5 F (36.9 C)     Temp Source 09/03/17 0935 Oral     SpO2 09/03/17 0935 94 %     Weight 09/03/17 0936 182 lb 15.7 oz (83 kg)     Height --      Head Circumference --      Peak Flow --      Pain Score 09/03/17 0935 10     Pain Loc --      Pain Edu? --      Excl. in [place]? --     Constitutional: Alert and oriented. Well appearing and in no acute distress. Eyes: Conjunctivae are normal. Head: Atraumatic. Nose: No congestion/rhinnorhea. Mouth/Throat: Mucous membranes are moist. Neck: No stridor.   Cardiovascular: Normal rate, regular rhythm. Grossly normal heart sounds.  Good peripheral circulation. Respiratory: Normal respiratory effort.  No retractions. Lungs CTAB. Gastrointestinal: Soft and nontender. No distention. Musculoskeletal: No lower extremity tenderness nor edema. Neurologic:  Normal speech and language. No gross focal neurologic deficits are appreciated.  Skin:  Skin is warm, dry and intact. No rash noted. Psychiatric: Mood and affect are normal. Speech and behavior are normal.  ____________________________________________   LABS (all labs ordered are listed, but only abnormal results are displayed)  Labs Reviewed  COMPREHENSIVE METABOLIC PANEL - Abnormal; Notable for the following components:      Result Value   Glucose, Bld 118 (*)    BUN 35 (*)    Creatinine, Ser 6.01 (*)    Calcium 8.3 (*)    Total Protein 8.7 (*)    ALT 12 (*)    Alkaline Phosphatase 167 (*)    GFR calc non Af Amer 6 (*)    GFR calc Af Amer 7 (*)    All other components within normal limits  CBC - Abnormal; Notable for the following components:   RDW 15.5 (*)    Platelets 129 (*)    All other components within normal limits  URINALYSIS, COMPLETE (UACMP) WITH MICROSCOPIC - Abnormal; Notable for the following components:   Color, Urine YELLOW (*)    APPearance CLOUDY (*)    Hgb urine dipstick LARGE (*)  Protein, ur 100 (*)    Leukocytes, UA LARGE (*)    Squamous  Epithelial / LPF 6-30 (*)    Non Squamous Epithelial 0-5 (*)    All other components within normal limits  URINE CULTURE  LIPASE, BLOOD   ____________________________________________  EKG ____________________________________________  RADIOLOGY  Ct Renal Stone Study   CT scan result reviewed by me iMPRESSION: Severe bilateral hydronephrosis, left greater than right. Interval removal of previously seen ureteral stents. Marked diffuse bladder wall thickening, similar prior study, compatible with known history of bladder cancer. Aortoiliac atherosclerosis. Moderate stool burden. Scattered left colonic diverticula. No active diverticulitis. Elevation of the left hemidiaphragm with left base atelectasis. Electronically Signed   By: Rolm Baptise M.D.   On: 09/03/2017 11:33    ____________________________________________   PROCEDURES  Procedure(s) performed: None  Procedures  Critical Care performed: No  ____________________________________________   INITIAL IMPRESSION / ASSESSMENT AND PLAN / ED COURSE  Pertinent labs & imaging results that were available during my care of the patient were reviewed by me and considered in my medical decision making (see chart for details).  Differential diagnosis includes but is not limited to, abdominal perforation, aortic dissection, cholecystitis, appendicitis, diverticulitis, colitis, esophagitis/gastritis, kidney stone, pyelonephritis, urinary tract infection, aortic aneurysm. All are considered in decision and treatment plan. Based upon the patient's presentation and risk factors, given the patient is associated left flank pain with noted hematuria today highly suspicious for a liver urologic process.  Clinical examination is reassuring, no evidence of peritonitis, no midline abdominal pain or history of aneurysm.  Will provide analgesic, anticipate noncontrast CT given the patient's history of being on dialysis without being anuric.   Clinical  Course as of Sep 03 1626  Fri Sep 03, 2017  1235 Case, CT, presentation, labs discussed with Dr. Sabra Heck (Urology).   [MQ]  6553 Discussed with Dr. Sabra Heck who has reviewed outpatient records and today's records/eval. Prior OP couldn't cannulate left ureter. Recommends pain likely secondary to hydro and known urology/bladder disease. Can't stent based on prior evaluation.   [MQ]  1246 Dr. Sabra Heck advises pain control, continue dialysis, and will contact Dr. Erlene Quan for close follow-up. No clear indication for percutaneous procedure.  [MQ]    Clinical Course User Index [MQ] Delman Kitten, MD   Dr. Holley Raring advises follow-up with routine dialysis schedule, keflex 542m Q24hours. Close follow-up with urology.   ----------------------------------------- 2:54 PM on 09/03/2017 -----------------------------------------  Patient reports she feels much better.  Reviewed urinalysis results and history with Dr. LZollie Scale will place patient on cephalexin for possible urinary tract infection.  Dosing as per nephrology recommendation.  Patient resting comfortably, normal oxygen saturation, does use 2 L of oxygen while resting.  She reports her pain is much better and she is comfortable to plan for follow-up and will resume her normal dialysis schedule and plan to follow-up closely with urology whom I have spoken with.  Return precautions and treatment recommendations and follow-up discussed with the patient who is agreeable with the plan.  ____________________________________________   FINAL CLINICAL IMPRESSION(S) / ED DIAGNOSES  Final diagnoses:  Urinary tract infection, acute  Left flank pain  Bilateral hydronephrosis  ESRD (end stage renal disease) ([place]      NEW MEDICATIONS STARTED DURING THIS VISIT:  Discharge Medication List as of 09/03/2017  3:10 PM    START taking these medications   Details  cephALEXin (KEFLEX) 500 MG capsule Take 1 capsule (500 mg total) by mouth daily., Starting Fri  09/03/2017, Print  Note:  This document was prepared using Dragon voice recognition software and may include unintentional dictation errors.     Delman Kitten, MD 09/03/17 [phone]

## 2017-09-03 NOTE — ED Notes (Addendum)
Attempted to start iv  And blood draw x 1. Unsuccessful.  Pt does have a portacath in right chest. Charge RN Bill notified of inability to obtain blood and status of pt.

## 2017-09-03 NOTE — ED Notes (Signed)
Charge RN notified about not being able to get blood in triage

## 2017-09-03 NOTE — ED Triage Notes (Signed)
Pt to ed with c/o left side abd pain and vomiting since this am.  Pt states she was at dialysis when pain got worse.  Did not finish dialysis.  Pt vomiting at triage.

## 2017-09-03 NOTE — Discharge Instructions (Signed)
Please follow-up with urology, Dr. Erlene Quan soon as possible.  Call today to confirm an appointment  Please continue your regular dialysis schedule.   Please return to the emergency room right away if you are to develop a fever, severe nausea, your pain becomes severe or worsens, you are unable to keep food down, begin vomiting any dark or bloody fluid, you develop any dark or bloody stools, feel dehydrated, or other new concerns or symptoms arise.

## 2017-09-03 NOTE — ED Notes (Signed)
First nurse note: Patient from dialysis c/o L side pain. Started but not did not finish treatment. L arm access

## 2017-09-05 LAB — URINE CULTURE: SPECIAL REQUESTS: NORMAL

## 2017-09-06 ENCOUNTER — Encounter (INDEPENDENT_AMBULATORY_CARE_PROVIDER_SITE_OTHER): Payer: Medicare Other

## 2017-09-06 ENCOUNTER — Ambulatory Visit (INDEPENDENT_AMBULATORY_CARE_PROVIDER_SITE_OTHER): Payer: Medicare Other | Admitting: Vascular Surgery

## 2017-09-09 ENCOUNTER — Ambulatory Visit (INDEPENDENT_AMBULATORY_CARE_PROVIDER_SITE_OTHER): Payer: Medicare Other | Admitting: Vascular Surgery

## 2017-09-09 ENCOUNTER — Encounter (INDEPENDENT_AMBULATORY_CARE_PROVIDER_SITE_OTHER): Payer: Medicare Other

## 2017-09-13 ENCOUNTER — Encounter: Payer: Self-pay | Admitting: Urology

## 2017-09-13 ENCOUNTER — Ambulatory Visit (INDEPENDENT_AMBULATORY_CARE_PROVIDER_SITE_OTHER): Payer: Medicare Other | Admitting: Urology

## 2017-09-13 VITALS — BP 104/58 | HR 81 | Ht 68.0 in | Wt 177.0 lb

## 2017-09-13 DIAGNOSIS — C670 Malignant neoplasm of trigone of bladder: Secondary | ICD-10-CM

## 2017-09-13 DIAGNOSIS — R300 Dysuria: Secondary | ICD-10-CM | POA: Diagnosis not present

## 2017-09-13 DIAGNOSIS — N1330 Unspecified hydronephrosis: Secondary | ICD-10-CM | POA: Diagnosis not present

## 2017-09-13 DIAGNOSIS — N3001 Acute cystitis with hematuria: Secondary | ICD-10-CM | POA: Diagnosis not present

## 2017-09-13 LAB — URINALYSIS, COMPLETE
Bilirubin, UA: NEGATIVE
GLUCOSE, UA: NEGATIVE
Ketones, UA: NEGATIVE
Nitrite, UA: NEGATIVE
PH UA: 7 (ref 5.0–7.5)
Specific Gravity, UA: 1.015 (ref 1.005–1.030)
Urobilinogen, Ur: 0.2 mg/dL (ref 0.2–1.0)

## 2017-09-13 LAB — MICROSCOPIC EXAMINATION

## 2017-09-14 ENCOUNTER — Other Ambulatory Visit: Payer: Self-pay | Admitting: Internal Medicine

## 2017-09-14 DIAGNOSIS — Z1239 Encounter for other screening for malignant neoplasm of breast: Secondary | ICD-10-CM

## 2017-09-14 NOTE — Progress Notes (Signed)
09/13/2017 4:09 PM   Barbara [name] 06-Sep-1940 [id number]  Referring provider: [name], MD [address] [place], [place] [address]  Chief Complaint  Patient presents with  . Follow-up    HPI: This patient is a 77 year old African-American with muscle invasive bladder cancer and ESRD on dialysis who is not a candidate for cystectomy who was seen in the ED on 09/03/2017 for abdominal pain.    She states that she was hurting very badly in her mid abdomen which caused her to seek treatment emergency department.  She states the pain was so severe she could not walk and had to call EMS to transfer her to the emergency department.  She was receiving her dialysis treatment at the time the pain began.  Non contrast CT performed in the ED noted severe bilateral hydronephrosis, left greater than right. Interval removal of previously seen ureteral stents.  Marked diffuse bladder wall thickening, similar prior study, compatible with known history of bladder cancer.  Aortoiliac atherosclerosis.  Moderate stool burden. Scattered left colonic diverticula. No active diverticulitis.  Elevation of the left hemidiaphragm with left base atelectasis.  UA performed in the ED was positive for TNTC RBC's, TNTC WBC's and 6-30 squamous epithelial cells.  She was placed on Keflex.  Urine culture grew out multiple species.   WBC count was stable at 7.5.    Today, she denied having any flank pain.  She no longer has her mid abdominal pain.  She states she feels fine she is actually under the impression that this appointment is for her cystoscopy.  Patient denies any gross hematuria, dysuria or suprapubic/flank pain.  Patient denies any fevers, chills, nausea or vomiting.  Her CATH UA today is positive for 6-10 WBC's and 11-30 RBC's.    Cancer of trigone of urinary bladder ([place]) # muscle invasive bladder cancer/c T2/stage II- not a candidate for cystectomy given multiple comorbidities s/p RT and weekly gemx2.  Discontinued due to poor tolerance 06/2016.   # Repeat cystoscopy [id number] - neg for recurrence. Last PET scan 01/01/17 was negative for metastatic disease.  Plan a PET scan in approximately 3 months.  Due for cystoscopy at this time.     PMH: Past Medical History:  Diagnosis Date  . Adult celiac disease   . Anemia associated with chronic renal failure 2017   blood transfusion last week 10/17  . Arthritis   . Asthma   . Cancer ([place]) 2017   bladder  . Chronic kidney disease   . 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  dr. Candiss Norse and dr. Holley Raring follow her  . COPD (chronic obstructive pulmonary disease) ([place])   . Coronary artery disease   . Diabetes mellitus without complication ([place])   . Dialysis patient ([place])    Tues, Thurs, Sat  . Dyspnea    with exertion  . Elevated lipids   . GERD (gastroesophageal reflux disease)   . Hematuria   . Hypertension   . Lower back pain   . Neuropathy   . Oxygen dependent    at hs  . Personal history of chemotherapy   . Sleep apnea    uses cpap  . Urinary obstruction 01/2016    Surgical History: Past Surgical History:  Procedure Laterality Date  . ABDOMINAL HYSTERECTOMY    . AV FISTULA PLACEMENT Left 02/17/2017   Procedure: INSERTION OF ARTERIOVENOUS (AV) GORE-TEX GRAFT ARM(BRACHIAL AXILLARY);  Surgeon: Katha Cabal, MD;  Location: [facility];  Service: Vascular;  Laterality: Left;  . CYSTOSCOPY W/ RETROGRADES Bilateral 02/17/2016   Procedure: CYSTOSCOPY WITH RETROGRADE PYELOGRAM;  Surgeon: Hollice Espy, MD;  Location: [facility];  Service: Urology;  Laterality: Bilateral;  . CYSTOSCOPY W/ RETROGRADES Bilateral 10/12/2016   Procedure: CYSTOSCOPY WITH RETROGRADE PYELOGRAM;  Surgeon: Hollice Espy, MD;  Location: [facility];  Service: Urology;  Laterality: Bilateral;  . CYSTOSCOPY W/ RETROGRADES Bilateral 06/09/2017   Procedure: CYSTOSCOPY WITH RETROGRADE PYELOGRAM;  Surgeon: Hollice Espy, MD;  Location: [facility];   Service: Urology;  Laterality: Bilateral;  . CYSTOSCOPY W/ URETERAL STENT PLACEMENT Left 05/12/2016   Procedure: CYSTOSCOPY WITH STENT REPLACEMENT;  Surgeon: Hollice Espy, MD;  Location: [facility];  Service: Urology;  Laterality: Left;  . CYSTOSCOPY W/ URETERAL STENT PLACEMENT Left 10/12/2016   Procedure: CYSTOSCOPY WITH STENT REPLACEMENT;  Surgeon: Hollice Espy, MD;  Location: [facility];  Service: Urology;  Laterality: Left;  . CYSTOSCOPY WITH BIOPSY N/A 06/09/2017   Procedure: CYSTOSCOPY WITH BLADDER BIOPSY;  Surgeon: Hollice Espy, MD;  Location: [facility];  Service: Urology;  Laterality: N/A;  . CYSTOSCOPY WITH STENT PLACEMENT Left 01/21/2016   Procedure: CYSTOSCOPY WITH double J STENT PLACEMENT;  Surgeon: Franchot Gallo, MD;  Location: [facility];  Service: Urology;  Laterality: Left;  . CYSTOSCOPY WITH STENT PLACEMENT Right 10/12/2016   Procedure: CYSTOSCOPY WITH STENT PLACEMENT;  Surgeon: Hollice Espy, MD;  Location: [facility];  Service: Urology;  Laterality: Right;  . DIALYSIS/PERMA CATHETER INSERTION N/A 11/20/2016   Procedure: Dialysis/Perma Catheter Insertion;  Surgeon: Algernon Huxley, MD;  Location: [facility];  Service: Cardiovascular;  Laterality: N/A;  . DIALYSIS/PERMA CATHETER REMOVAL N/A 03/30/2017   Procedure: DIALYSIS/PERMA CATHETER REMOVAL;  Surgeon: Katha Cabal, MD;  Location: [facility];  Service: Cardiovascular;  Laterality: N/A;  . KIDNEY SURGERY  01/21/2016   IR NEPHROSTOMY PLACEMENT LEFT   . PERIPHERAL VASCULAR CATHETERIZATION N/A 04/07/2016   Procedure: Glori Luis Cath Insertion;  Surgeon: Katha Cabal, MD;  Location: [facility];  Service: Cardiovascular;  Laterality: N/A;  . PERIPHERAL VASCULAR THROMBECTOMY Left 06/02/2017   Procedure: PERIPHERAL VASCULAR THROMBECTOMY;  Surgeon: Algernon Huxley, MD;  Location: [facility];  Service: Cardiovascular;  Laterality: Left;  . PORTACATH PLACEMENT Right   . ROTATOR CUFF REPAIR Left   .  TRANSURETHRAL RESECTION OF BLADDER TUMOR N/A 02/17/2016   Procedure: TRANSURETHRAL RESECTION OF BLADDER TUMOR (TURBT)-LARGE;  Surgeon: Hollice Espy, MD;  Location: [facility];  Service: Urology;  Laterality: N/A;  . URETEROSCOPY Left 02/17/2016   Procedure: URETEROSCOPY;  Surgeon: Hollice Espy, MD;  Location: [facility];  Service: Urology;  Laterality: Left;  . URETEROSCOPY Right 10/12/2016   Procedure: URETEROSCOPY;  Surgeon: Hollice Espy, MD;  Location: [facility];  Service: Urology;  Laterality: Right;    Home Medications:  Allergies as of 09/13/2017      Reactions   Glipizide Er    Dropped blood sugar too much   Percocet [oxycodone-acetaminophen] Hives      Medication List        Accurate as of 09/13/17 11:59 PM. Always use your most recent med list.          ACCU-CHEK AVIVA PLUS test strip Generic drug:  glucose blood 1 each by Other route daily. use as directed   ACCU-CHEK SOFTCLIX LANCETS lancets INJECT 1 EACH AS DIRECTED ONCE DAILY.   acetaminophen 500 MG tablet Commonly known as:  TYLENOL Take 500 mg by mouth every 6 (six) hours as  needed for mild pain or headache.   atorvastatin 40 MG tablet Commonly known as:  LIPITOR Take 40 mg by mouth every evening.   calcium acetate 667 MG capsule Commonly known as:  PHOSLO Take 2 capsules (1,334 mg total) by mouth 3 (three) times daily with meals.   cholecalciferol 1000 units tablet Commonly known as:  VITAMIN D Take 1,000 Units by mouth daily.   cinacalcet 30 MG tablet Commonly known as:  SENSIPAR Take 30 mg daily by mouth. With largest meal   esomeprazole 40 MG capsule Commonly known as:  NEXIUM Take 40 mg by mouth daily after breakfast.   ferrous sulfate 325 (65 FE) MG EC tablet Take 325 mg by mouth daily after breakfast.   furosemide 40 MG tablet Commonly known as:  LASIX Take 40 mg 2 (two) times daily by mouth. Take morning dose every day, take lunchtime dose as needed   gabapentin 300 MG capsule Commonly  known as:  NEURONTIN Take 600 mg by mouth 3 (three) times daily.   lidocaine 5 % ointment Commonly known as:  XYLOCAINE Apply 1 application topically daily as needed for mild pain.   lidocaine-prilocaine cream Commonly known as:  EMLA APPLY TO ACCESS SITE 30-40 MIN PRIOR TO DIALYSIS TREATMENT   MYRBETRIQ 25 MG Tb24 tablet Generic drug:  mirabegron ER TAKE 1 TABLET (25 MG TOTAL) BY MOUTH DAILY.   NON FORMULARY Place 1 Units into the nose at bedtime. CPAP Time of use 2100-0600   oxybutynin 5 MG tablet Commonly known as:  DITROPAN Take 5 mg by mouth 3 (three) times daily.   OXYGEN Inhale 2 L into the lungs at bedtime.   PROAIR RESPICLICK 737 (90 Base) MCG/ACT Aepb Generic drug:  Albuterol Sulfate Inhale 2 puffs into the lungs 4 (four) times daily as needed (wheezing).   traMADol 50 MG tablet Commonly known as:  ULTRAM Take 1 tablet (50 mg total) by mouth every 12 (twelve) hours as needed.   TRAVATAN Z 0.004 % Soln ophthalmic solution Generic drug:  Travoprost (BAK Free) PLACE 1 DROP INTO BOTH EYES EVERY NIGHT AT BEDTIME       Allergies:  Allergies  Allergen Reactions  . Glipizide Er     Dropped blood sugar too much  . Percocet [Oxycodone-Acetaminophen] Hives    Family History: Family History  Problem Relation Age of Onset  . Diabetes Mother   . Cancer Father   . Breast cancer Neg Hx   . Kidney disease Neg Hx     Social History:  reports that she quit smoking about 15 years ago. Her smoking use included cigarettes. she has never used smokeless tobacco. She reports that she does not drink alcohol or use drugs.  ROS: UROLOGY Frequent Urination?: No Hard to postpone urination?: No Burning/pain with urination?: No Get up at night to urinate?: No Leakage of urine?: No Urine stream starts and stops?: No Trouble starting stream?: No Do you have to strain to urinate?: No Blood in urine?: No Urinary tract infection?: No Sexually transmitted disease?:  No Injury to kidneys or bladder?: No Painful intercourse?: No Weak stream?: No Currently pregnant?: No Vaginal bleeding?: No Last menstrual period?: n  Gastrointestinal Nausea?: No Vomiting?: No Indigestion/heartburn?: No Diarrhea?: No Constipation?: No  Constitutional Fever: No Night sweats?: No Weight loss?: No Fatigue?: No  Skin Skin rash/lesions?: No Itching?: No  Eyes Blurred vision?: No Double vision?: No  Ears/Nose/Throat Sore throat?: Yes Sinus problems?: No  Hematologic/Lymphatic Swollen glands?: No Easy bruising?: No  Cardiovascular Leg swelling?: No Chest pain?: No  Respiratory Cough?: No Shortness of breath?: No  Endocrine Excessive thirst?: No  Musculoskeletal Back pain?: No Joint pain?: No  Neurological Headaches?: No Dizziness?: No  Psychologic Depression?: No Anxiety?: No  Physical Exam: BP (!) 104/58   Pulse 81   Ht 5' 8"  (1.727 m)   Wt 177 lb (80.3 kg)   BMI 26.91 kg/m   Constitutional: Well nourished. Alert and oriented, No acute distress. HEENT: [place] AT, moist mucus membranes. Trachea midline, no masses. Cardiovascular: No clubbing, cyanosis, or edema. Respiratory: Normal respiratory effort, no increased work of breathing. GI: Abdomen is soft, non tender, non distended, no abdominal masses. Liver and spleen not palpable.  No hernias appreciated.  Stool sample for occult testing is not indicated.   GU: No CVA tenderness.  No bladder fullness or masses.   Skin: No rashes, bruises or suspicious lesions. Lymph: No cervical or inguinal adenopathy. Neurologic: Grossly intact, no focal deficits, moving all 4 extremities. Psychiatric: Normal mood and affect.  Laboratory Data: Lab Results  Component Value Date   WBC 7.5 09/03/2017   HGB 13.1 09/03/2017   HCT 40.2 09/03/2017   MCV 96.4 09/03/2017   PLT 129 (L) 09/03/2017    Lab Results  Component Value Date   CREATININE 6.01 (H) 09/03/2017    No results found for:  PSA  No results found for: TESTOSTERONE  Lab Results  Component Value Date   HGBA1C 4.8 01/27/2017    Lab Results  Component Value Date   TSH 1.283 04/17/2016       Component Value Date/Time   CHOL 100 08/03/2012 0250   HDL 31 (L) 08/03/2012 0250   VLDL 47 (H) 08/03/2012 0250   LDLCALC 22 08/03/2012 0250    Lab Results  Component Value Date   AST 18 09/03/2017   Lab Results  Component Value Date   ALT 12 (L) 09/03/2017   No components found for: ALKALINEPHOPHATASE No components found for: BILIRUBINTOTAL  No results found for: ESTRADIOL  Urinalysis 6-10 WBC.  11-30 RBC.  See Epic.  I have reviewed the labs.   Pertinent Imaging: CLINICAL DATA:  Left flank pain, gross hematuria  EXAM: CT ABDOMEN AND PELVIS WITHOUT CONTRAST  TECHNIQUE: Multidetector CT imaging of the abdomen and pelvis was performed following the standard protocol without IV contrast.  COMPARISON:  PET CT 01/01/2017  FINDINGS: Lower chest: Left lower lobe atelectasis. Mild elevation of the left hemidiaphragm. Heart is normal size. No effusions.  Hepatobiliary: No focal hepatic abnormality. Gallbladder unremarkable.  Pancreas: No focal abnormality or ductal dilatation.  Spleen: No focal abnormality.  Normal size.  Adrenals/Urinary Tract: Previously seen bilateral ureteral stents have been removed. There is severe bilateral hydronephrosis, left greater than right. Marked urinary bladder wall thickening is similar to prior study compatible with known history of bladder cancer. No adrenal mass.  Stomach/Bowel: Normal appendix. Few scattered left colonic diverticula. Moderate stool in the colon. No evidence of bowel obstruction.  Vascular/Lymphatic: Diffuse aortic, iliac and branch vessel calcifications. No evidence of aneurysm or adenopathy.  Reproductive: No visible focal abnormality.  Other: No free fluid or free air.  Musculoskeletal: Degenerative disc and facet  disease in the lower lumbar spine. No focal bony abnormality.  IMPRESSION: Severe bilateral hydronephrosis, left greater than right. Interval removal of previously seen ureteral stents.  Marked diffuse bladder wall thickening, similar prior study, compatible with known history of bladder cancer.  Aortoiliac atherosclerosis.  Moderate stool burden. Scattered left colonic  diverticula. No active diverticulitis.  Elevation of the left hemidiaphragm with left base atelectasis.   Electronically Signed   By: Rolm Baptise M.D.   On: 09/03/2017 11:33  I have independently reviewed the films.    Assessment & Plan:    1. Abdominal pain - resolved   2. Acute cystitis with hematuria - Urinalysis, Complete - suspicious for infection, CATH specimen - CULTURE, URINE COMPREHENSIVE  3. Bilateral hydronephrosis - no flank pain - no role for stenting as she is ESRD on dialysis  4. Muscle invasive bladder cancer - reschedule cystoscopy as she cannot have an am appointment due to her dialysis  Return for cystoscopy with Dr. Erlene Quan.  These notes generated with voice recognition software. I apologize for typographical errors.  Zara Council, [facility] [address], [place], [place] [address] [phone]

## 2017-09-15 ENCOUNTER — Other Ambulatory Visit: Payer: Medicare Other | Admitting: Urology

## 2017-09-16 ENCOUNTER — Inpatient Hospital Stay: Payer: Medicare Other | Admitting: Internal Medicine

## 2017-09-16 ENCOUNTER — Inpatient Hospital Stay: Payer: Medicare Other | Attending: Internal Medicine

## 2017-09-16 ENCOUNTER — Ambulatory Visit: Payer: Medicare Other | Admitting: Podiatry

## 2017-09-16 LAB — CULTURE, URINE COMPREHENSIVE

## 2017-09-16 NOTE — Progress Notes (Deleted)
[facility] OFFICE PROGRESS NOTE  Patient Care Team: [name], MD as PCP - General (Internal Medicine)  Cancer Staging No matching staging information was found for the patient.   Oncology History    #AUG 2017 TURBT- [Dr.Brandon] Large nodular proximally 5 cm bladder mass involving the left hemitrigone extending up the left lateral bladder wall, 2 discrete nodules with bridge of abnormal tissue; Tumor clinically T2. TURBT- Tumor invades muscularis propria [detrusor muscle]. NOT candidate for cystectomy.  # SEP 2017- RT- GEM weekly x2 [gem discontinued sec to poor tolerance]; Nov 2018- cysto- NEG path [Dr.Brandon]  # ESRD on HD-Dr.Singh / C.diff [aug 2017]     Cancer of trigone of urinary bladder ([place])       INTERVAL HISTORY:  Barbara [name] 77 y.o.  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   In the interim patient recently has been started on dialysis.  She has been tolerating dialysis fairly well.  Patient also had a recent cystoscopy-biopsy negative for malignancy.  She admits to have chronic mild fatigue.  Not any worse.  No nausea no vomiting.  No chest pain or shortness of breath or cough.  No fevers or chills.   REVIEW OF SYSTEMS:  A complete 10 point review of system is done which is negative except mentioned above/history of present illness.   PAST MEDICAL HISTORY :  Past Medical History:  Diagnosis Date  . Adult celiac disease   . Anemia associated with chronic renal failure 2017   blood transfusion last week 10/17  . Arthritis   . Asthma   . Cancer ([place]) 2017   bladder  . Chronic kidney disease   . 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  dr. Candiss Norse and dr. Holley Raring follow her  . COPD (chronic obstructive pulmonary disease) ([place])   . Coronary artery  disease   . Diabetes mellitus without complication ([place])   . Dialysis patient ([place])    Tues, Thurs, Sat  . Dyspnea    with exertion  . Elevated lipids   . GERD (gastroesophageal reflux disease)   . Hematuria   . Hypertension   . Lower back pain   . Neuropathy   . Oxygen dependent    at hs  . Personal history of chemotherapy   . Sleep apnea    uses cpap  . Urinary obstruction 01/2016    PAST SURGICAL HISTORY :   Past Surgical History:  Procedure Laterality Date  . ABDOMINAL HYSTERECTOMY    . AV FISTULA PLACEMENT Left 02/17/2017   Procedure: INSERTION OF ARTERIOVENOUS (AV) GORE-TEX GRAFT ARM(BRACHIAL AXILLARY);  Surgeon: Katha Cabal, MD;  Location: [facility];  Service: Vascular;  Laterality: Left;  . CYSTOSCOPY W/ RETROGRADES Bilateral 02/17/2016   Procedure: CYSTOSCOPY WITH RETROGRADE PYELOGRAM;  Surgeon: Hollice Espy, MD;  Location: [facility];  Service: Urology;  Laterality: Bilateral;  . CYSTOSCOPY W/ RETROGRADES Bilateral 10/12/2016   Procedure: CYSTOSCOPY WITH RETROGRADE PYELOGRAM;  Surgeon: Hollice Espy, MD;  Location: [facility];  Service: Urology;  Laterality: Bilateral;  . CYSTOSCOPY W/ RETROGRADES Bilateral 06/09/2017   Procedure: CYSTOSCOPY WITH RETROGRADE PYELOGRAM;  Surgeon: Hollice Espy, MD;  Location: [facility];  Service: Urology;  Laterality: Bilateral;  . CYSTOSCOPY W/ URETERAL STENT PLACEMENT Left 05/12/2016   Procedure: CYSTOSCOPY WITH STENT REPLACEMENT;  Surgeon: Hollice Espy, MD;  Location: [facility];  Service: Urology;  Laterality: Left;  . CYSTOSCOPY W/ URETERAL STENT PLACEMENT Left 10/12/2016   Procedure: CYSTOSCOPY WITH STENT REPLACEMENT;  Surgeon: Hollice Espy, MD;  Location: [facility];  Service: Urology;  Laterality: Left;  . CYSTOSCOPY WITH BIOPSY N/A 06/09/2017   Procedure: CYSTOSCOPY WITH BLADDER BIOPSY;  Surgeon: Hollice Espy, MD;  Location: [facility];  Service: Urology;  Laterality: N/A;  . CYSTOSCOPY WITH STENT PLACEMENT Left 01/21/2016    Procedure: CYSTOSCOPY WITH double J STENT PLACEMENT;  Surgeon: Franchot Gallo, MD;  Location: [facility];  Service: Urology;  Laterality: Left;  . CYSTOSCOPY WITH STENT PLACEMENT Right 10/12/2016   Procedure: CYSTOSCOPY WITH STENT PLACEMENT;  Surgeon: Hollice Espy, MD;  Location: [facility];  Service: Urology;  Laterality: Right;  . DIALYSIS/PERMA CATHETER INSERTION N/A 11/20/2016   Procedure: Dialysis/Perma Catheter Insertion;  Surgeon: Algernon Huxley, MD;  Location: [facility];  Service: Cardiovascular;  Laterality: N/A;  . DIALYSIS/PERMA CATHETER REMOVAL N/A 03/30/2017   Procedure: DIALYSIS/PERMA CATHETER REMOVAL;  Surgeon: Katha Cabal, MD;  Location: [facility];  Service: Cardiovascular;  Laterality: N/A;  . KIDNEY SURGERY  01/21/2016   IR NEPHROSTOMY PLACEMENT LEFT   . PERIPHERAL VASCULAR CATHETERIZATION N/A 04/07/2016   Procedure: Glori Luis Cath Insertion;  Surgeon: Katha Cabal, MD;  Location: [facility];  Service: Cardiovascular;  Laterality: N/A;  . PERIPHERAL VASCULAR THROMBECTOMY Left 06/02/2017   Procedure: PERIPHERAL VASCULAR THROMBECTOMY;  Surgeon: Algernon Huxley, MD;  Location: [facility];  Service: Cardiovascular;  Laterality: Left;  . PORTACATH PLACEMENT Right   . ROTATOR CUFF REPAIR Left   . TRANSURETHRAL RESECTION OF BLADDER TUMOR N/A 02/17/2016   Procedure: TRANSURETHRAL RESECTION OF BLADDER TUMOR (TURBT)-LARGE;  Surgeon: Hollice Espy, MD;  Location: [facility];  Service: Urology;  Laterality: N/A;  . URETEROSCOPY Left 02/17/2016   Procedure: URETEROSCOPY;  Surgeon: Hollice Espy, MD;  Location: [facility];  Service: Urology;  Laterality: Left;  . URETEROSCOPY Right 10/12/2016   Procedure: URETEROSCOPY;  Surgeon: Hollice Espy, MD;  Location: [facility];  Service: Urology;  Laterality: Right;    FAMILY HISTORY :   Family History  Problem Relation Age of Onset  . Diabetes Mother   . Cancer Father   . Breast cancer Neg Hx   . Kidney  disease Neg Hx     SOCIAL HISTORY:   Social History   Tobacco Use  . Smoking status: Former Smoker    Types: Cigarettes    Last attempt to quit: 07/20/2002    Years since quitting: 15.1  . Smokeless tobacco: Never Used  . Tobacco comment: 01/22/2016   "  quit smoking many years ago "  Substance Use Topics  . Alcohol use: No  . Drug use: No    ALLERGIES:  is allergic to glipizide er and percocet [oxycodone-acetaminophen].  MEDICATIONS:  Current Outpatient Medications  Medication Sig Dispense Refill  . ACCU-CHEK AVIVA PLUS test strip 1 each by Other route daily. use as directed  3  . ACCU-CHEK SOFTCLIX LANCETS lancets INJECT 1 EACH AS DIRECTED ONCE DAILY.  3  . acetaminophen (TYLENOL) 500 MG tablet Take 500 mg by mouth every 6 (six) hours as needed for mild pain or headache.     . Albuterol Sulfate (PROAIR RESPICLICK) 629 (90 Base) MCG/ACT AEPB Inhale 2 puffs into the lungs 4 (four) times daily as needed (wheezing).     Marland Kitchen atorvastatin (LIPITOR) 40 MG tablet Take 40 mg by mouth  every evening.  5  . calcium acetate (PHOSLO) 667 MG capsule Take 2 capsules (1,334 mg total) by mouth 3 (three) times daily with meals. 90 capsule 0  . cholecalciferol (VITAMIN D) 1000 units tablet Take 1,000 Units by mouth daily.    . cinacalcet (SENSIPAR) 30 MG tablet Take 30 mg daily by mouth. With largest meal    . esomeprazole (NEXIUM) 40 MG capsule Take 40 mg by mouth daily after breakfast.     . ferrous sulfate 325 (65 FE) MG EC tablet Take 325 mg by mouth daily after breakfast.     . furosemide (LASIX) 40 MG tablet Take 40 mg 2 (two) times daily by mouth. Take morning dose every day, take lunchtime dose as needed    . gabapentin (NEURONTIN) 300 MG capsule Take 600 mg by mouth 3 (three) times daily.    Marland Kitchen lidocaine (XYLOCAINE) 5 % ointment Apply 1 application topically daily as needed for mild pain.     Marland Kitchen lidocaine-prilocaine (EMLA) cream APPLY TO ACCESS SITE 30-40 MIN PRIOR TO DIALYSIS TREATMENT  11  .  MYRBETRIQ 25 MG TB24 tablet TAKE 1 TABLET (25 MG TOTAL) BY MOUTH DAILY. 30 tablet 5  . NON FORMULARY Place 1 Units into the nose at bedtime. CPAP Time of use 2100-0600    . oxybutynin (DITROPAN) 5 MG tablet Take 5 mg by mouth 3 (three) times daily.    . OXYGEN Inhale 2 L into the lungs at bedtime.    . traMADol (ULTRAM) 50 MG tablet Take 1 tablet (50 mg total) by mouth every 12 (twelve) hours as needed. 20 tablet 0  . TRAVATAN Z 0.004 % SOLN ophthalmic solution PLACE 1 DROP INTO BOTH EYES EVERY NIGHT AT BEDTIME  5   No current facility-administered medications for this visit.     PHYSICAL EXAMINATION: ECOG PERFORMANCE STATUS: 2 - Symptomatic, <50% confined to bed  There were no vitals taken for this visit.  There were no vitals filed for this visit.  GENERAL: Well-nourished well-developed; Alert, no distress and comfortable. Using a cane for ambulation.  Accompanied by her husband.  Obese. EYES: no pallor or icterus OROPHARYNX: no thrush or ulceration; good dentition  NECK: supple, no masses felt LYMPH:  no palpable lymphadenopathy in the cervical, axillary or inguinal regions LUNGS: clear to auscultation and  No wheeze or crackles HEART/CVS: regular rate & rhythm and no murmurs; No lower extremity edema ABDOMEN:abdomen soft, non-tender and normal bowel sounds Musculoskeletal:no cyanosis of digits and no clubbing  PSYCH: alert & oriented x 3 with fluent speech NEURO: no focal motor/sensory deficits SKIN:  no rashes or significant lesions  LABORATORY DATA:  I have reviewed the data as listed    Component Value Date/Time   NA 137 09/03/2017 1140   NA 138 11/04/2016 1453   NA 138 08/07/2012 0433   K 4.3 09/03/2017 1140   K 3.2 (L) 10/30/2014 1410   CL 102 09/03/2017 1140   CL 104 08/07/2012 0433   CO2 23 09/03/2017 1140   CO2 25 08/07/2012 0433   GLUCOSE 118 (H) 09/03/2017 1140   GLUCOSE 172 (H) 08/07/2012 0433   BUN 35 (H) 09/03/2017 1140   BUN 69 (H) 11/04/2016 1453    BUN 63 (H) 08/07/2012 0433   CREATININE 6.01 (H) 09/03/2017 1140   CREATININE 2.48 (H) 08/07/2012 0433   CALCIUM 8.3 (L) 09/03/2017 1140   CALCIUM 9.1 08/07/2012 0433   PROT 8.7 (H) 09/03/2017 1140   PROT 8.9 (H) 08/02/2012  2217   ALBUMIN 4.1 09/03/2017 1140   ALBUMIN 3.9 08/02/2012 0958   AST 18 09/03/2017 1140   AST 21 08/02/2012 0958   ALT 12 (L) 09/03/2017 1140   ALT 19 08/02/2012 0958   ALKPHOS 167 (H) 09/03/2017 1140   ALKPHOS 107 08/02/2012 0958   BILITOT 0.6 09/03/2017 1140   BILITOT 0.3 08/02/2012 0958   GFRNONAA 6 (L) 09/03/2017 1140   GFRNONAA 19 (L) 08/07/2012 0433   GFRAA 7 (L) 09/03/2017 1140   GFRAA 22 (L) 08/07/2012 0433    No results found for: SPEP, UPEP  Lab Results  Component Value Date   WBC 7.5 09/03/2017   NEUTROABS 2.4 06/14/2017   HGB 13.1 09/03/2017   HCT 40.2 09/03/2017   MCV 96.4 09/03/2017   PLT 129 (L) 09/03/2017      Chemistry      Component Value Date/Time   NA 137 09/03/2017 1140   NA 138 11/04/2016 1453   NA 138 08/07/2012 0433   K 4.3 09/03/2017 1140   K 3.2 (L) 10/30/2014 1410   CL 102 09/03/2017 1140   CL 104 08/07/2012 0433   CO2 23 09/03/2017 1140   CO2 25 08/07/2012 0433   BUN 35 (H) 09/03/2017 1140   BUN 69 (H) 11/04/2016 1453   BUN 63 (H) 08/07/2012 0433   CREATININE 6.01 (H) 09/03/2017 1140   CREATININE 2.48 (H) 08/07/2012 0433      Component Value Date/Time   CALCIUM 8.3 (L) 09/03/2017 1140   CALCIUM 9.1 08/07/2012 0433   ALKPHOS 167 (H) 09/03/2017 1140   ALKPHOS 107 08/02/2012 0958   AST 18 09/03/2017 1140   AST 21 08/02/2012 0958   ALT 12 (L) 09/03/2017 1140   ALT 19 08/02/2012 0958   BILITOT 0.6 09/03/2017 1140   BILITOT 0.3 08/02/2012 0958       RADIOGRAPHIC STUDIES: I have personally reviewed the radiological images as listed and agreed with the findings in the report. No results found.   ASSESSMENT & PLAN:  No problem-specific Assessment & Plan notes found for this encounter.   No orders of  the defined types were placed in this encounter.  All questions were answered. The patient knows to call the clinic with any problems, questions or concerns.      Cammie Sickle, MD 09/16/2017 8:48 AM

## 2017-09-21 ENCOUNTER — Ambulatory Visit (INDEPENDENT_AMBULATORY_CARE_PROVIDER_SITE_OTHER): Payer: Medicare Other | Admitting: Urology

## 2017-09-21 ENCOUNTER — Encounter: Payer: Self-pay | Admitting: Urology

## 2017-09-21 ENCOUNTER — Other Ambulatory Visit: Payer: Self-pay | Admitting: Radiology

## 2017-09-21 VITALS — BP 121/63 | HR 77 | Ht 68.0 in | Wt 181.0 lb

## 2017-09-21 DIAGNOSIS — N1330 Unspecified hydronephrosis: Secondary | ICD-10-CM

## 2017-09-21 DIAGNOSIS — C679 Malignant neoplasm of bladder, unspecified: Secondary | ICD-10-CM | POA: Diagnosis not present

## 2017-09-21 LAB — URINALYSIS, COMPLETE
Bilirubin, UA: NEGATIVE
Glucose, UA: NEGATIVE
Ketones, UA: NEGATIVE
NITRITE UA: NEGATIVE
Specific Gravity, UA: 1.015 (ref 1.005–1.030)
Urobilinogen, Ur: 0.2 mg/dL (ref 0.2–1.0)
pH, UA: 7 (ref 5.0–7.5)

## 2017-09-21 LAB — MICROSCOPIC EXAMINATION

## 2017-09-21 MED ORDER — LIDOCAINE HCL 2 % EX GEL
1.0000 | Freq: Once | CUTANEOUS | Status: AC
Start: 2017-09-21 — End: 2017-09-21
  Administered 2017-09-21: 1 via URETHRAL

## 2017-09-21 MED ORDER — CIPROFLOXACIN HCL 500 MG PO TABS
500.0000 mg | ORAL_TABLET | Freq: Once | ORAL | Status: AC
  Administered 2017-09-21: 500 mg via ORAL

## 2017-09-21 NOTE — Progress Notes (Signed)
   09/21/17  CC:  Chief Complaint  Patient presents with  . Cysto    HPI: 77 year old female with history of muscle invasive bladder cancer diagnosed in 01/2015 status post TURBT followed by palliative gemcitabine and concurrent radiation.    She also underwent recent CT PET scan on 01/01/2017 which shows marked bladder wall thickening and bilateral hydronephrosis despite ureteral stents in place. There is no evidence of metastatic disease. She is being followed closely by the cancer center as well.  She was taken to the operating room on 10/12/2016 at which time the bladder was noted to be diffusely erythematous, small in capacity with newly identified right hydroureteronephrosis down to level of the distal ureter with a negative ureteroscopy. This possibly related to radiation changes of the distal ureter. Bilateral ureteral stents replaced at that time.  She was started on hemodialysis. 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  There is no evidence of malignancy.  The left UO was scarred down and unable to be cannulated at the time of retrograde pyelogram.  Most recently, she returned to the ER on 09/03/2017 with left flank pain.  This is since completely resolved.  No further intervention was undertaken.  She has no complaints today.  Blood pressure 129/62, pulse 84, height 5' 8"  (1.727 m), weight 161 lb 14.4 oz (73.4 kg). NED. A&Ox3.   No respiratory distress   Abd soft, NT, ND Normal external genitalia with patent urethral meatus  Cystoscopy Procedure Note  Patient identification was confirmed, informed consent was obtained, and patient was prepped using Betadine solution.  Lidocaine jelly was administered per urethral meatus.    Preoperative abx where received prior to procedure.    Procedure: - Flexible  cystoscope introduced, without any difficulty.   - Thorough search of the bladder revealed:    Pipe like urethral meatus Mildly trabeculated bladder Multiple diffuse patches of bladder erythema with ulceration consistent with known radiation changes. In addition to this, she has a raised lesion on the right lateral bladder wall in close proximity but not involving the ureteral orifice.  There is a hyperpigmented lesion but has some papillary changes concerning for recurrent tumor.  Post-Procedure: - Cystoscopy well tolerated today  Assessment/ Plan:  1.  History of bladder cancer/bladder lesions Although she is a known history of radiation cystitis status post recent biopsy proving this, there is an area that is now raised and concerning for tumor recurrence. I recommended proceeding to the operating room for cystoscopy, biopsy, bilateral retrograde pyelogram Risk and benefits reviewed in detail. - Urinalysis, Complete - ciprofloxacin (CIPRO) tablet 500 mg; Take 1 tablet (500 mg total) by mouth once. - lidocaine (XYLOCAINE) 2 % jelly 1 application; Place 1 application into the urethra once. - Microscopic Examination  2. Hydronephrosis Likely related to bilateral radiation changes of the ureter/bladder No further ureteral stenting given progression of end-stage renal disease now on hemodialysis Flank pain has resolved, etiology unclear  Schedule bladder biopsy, bilateral retrograde pyelogram  Hollice Espy, MD

## 2017-09-21 NOTE — H&P (View-Only) (Signed)
   09/21/17  CC:  Chief Complaint  Patient presents with  . Cysto    HPI: 77 year old female with history of muscle invasive bladder cancer diagnosed in 01/2015 status post TURBT followed by palliative gemcitabine and concurrent radiation.    She also underwent recent CT PET scan on 01/01/2017 which shows marked bladder wall thickening and bilateral hydronephrosis despite ureteral stents in place. There is no evidence of metastatic disease. She is being followed closely by the cancer center as well.  She was taken to the operating room on 10/12/2016 at which time the bladder was noted to be diffusely erythematous, small in capacity with newly identified right hydroureteronephrosis down to level of the distal ureter with a negative ureteroscopy. This possibly related to radiation changes of the distal ureter. Bilateral ureteral stents replaced at that time.  She was started on hemodialysis. 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  There is no evidence of malignancy.  The left UO was scarred down and unable to be cannulated at the time of retrograde pyelogram.  Most recently, she returned to the ER on 09/03/2017 with left flank pain.  This is since completely resolved.  No further intervention was undertaken.  She has no complaints today.  Blood pressure 129/62, pulse 84, height 5' 8"  (1.727 m), weight 161 lb 14.4 oz (73.4 kg). NED. A&Ox3.   No respiratory distress   Abd soft, NT, ND Normal external genitalia with patent urethral meatus  Cystoscopy Procedure Note  Patient identification was confirmed, informed consent was obtained, and patient was prepped using Betadine solution.  Lidocaine jelly was administered per urethral meatus.    Preoperative abx where received prior to procedure.    Procedure: - Flexible  cystoscope introduced, without any difficulty.   - Thorough search of the bladder revealed:    Pipe like urethral meatus Mildly trabeculated bladder Multiple diffuse patches of bladder erythema with ulceration consistent with known radiation changes. In addition to this, she has a raised lesion on the right lateral bladder wall in close proximity but not involving the ureteral orifice.  There is a hyperpigmented lesion but has some papillary changes concerning for recurrent tumor.  Post-Procedure: - Cystoscopy well tolerated today  Assessment/ Plan:  1.  History of bladder cancer/bladder lesions Although she is a known history of radiation cystitis status post recent biopsy proving this, there is an area that is now raised and concerning for tumor recurrence. I recommended proceeding to the operating room for cystoscopy, biopsy, bilateral retrograde pyelogram Risk and benefits reviewed in detail. - Urinalysis, Complete - ciprofloxacin (CIPRO) tablet 500 mg; Take 1 tablet (500 mg total) by mouth once. - lidocaine (XYLOCAINE) 2 % jelly 1 application; Place 1 application into the urethra once. - Microscopic Examination  2. Hydronephrosis Likely related to bilateral radiation changes of the ureter/bladder No further ureteral stenting given progression of end-stage renal disease now on hemodialysis Flank pain has resolved, etiology unclear  Schedule bladder biopsy, bilateral retrograde pyelogram  Hollice Espy, MD

## 2017-09-23 ENCOUNTER — Other Ambulatory Visit: Payer: Self-pay | Admitting: Radiology

## 2017-09-23 DIAGNOSIS — C679 Malignant neoplasm of bladder, unspecified: Secondary | ICD-10-CM

## 2017-09-23 LAB — CULTURE, URINE COMPREHENSIVE

## 2017-09-24 ENCOUNTER — Telehealth: Payer: Self-pay | Admitting: Radiology

## 2017-09-24 ENCOUNTER — Other Ambulatory Visit: Payer: Self-pay | Admitting: Radiology

## 2017-09-24 DIAGNOSIS — N390 Urinary tract infection, site not specified: Secondary | ICD-10-CM

## 2017-09-24 DIAGNOSIS — R319 Hematuria, unspecified: Principal | ICD-10-CM

## 2017-09-24 MED ORDER — AMOXICILLIN-POT CLAVULANATE 875-125 MG PO TABS: 1.0000 | ORAL_TABLET | Freq: Two times a day (BID) | ORAL | 0 refills | Status: DC

## 2017-09-24 NOTE — Telephone Encounter (Signed)
-----   Message from Hollice Espy, MD sent at 09/24/2017  8:28 AM EST ----- Please start Augmentin twice daily times 7 days starting today prior to her procedure.  Hollice Espy, MD

## 2017-09-24 NOTE — Telephone Encounter (Signed)
Notified pt's husband of +ucx & script sent to pharmacy. Dosing instructions given & advised that pt needs to start medication today. Husband voices understanding with no questions at this time.

## 2017-09-30 ENCOUNTER — Other Ambulatory Visit: Payer: Self-pay

## 2017-09-30 ENCOUNTER — Encounter
Admission: RE | Admit: 2017-09-30 | Discharge: 2017-09-30 | Disposition: A | Discharge status: Home / Self Care | Payer: Medicare Other | Source: Ambulatory Visit | Attending: Urology | Admitting: Urology

## 2017-09-30 NOTE — Patient Instructions (Signed)
Your procedure is scheduled on: Thursday 10/07/17 Report to [facility]. To find out your arrival time please call [phone] between 1PM - 3PM on Wednesday 10/06/17.  Remember: Instructions that are not followed completely may result in serious medical risk, up to and including death, or upon the discretion of your surgeon and anesthesiologist your surgery may need to be rescheduled.     _X__ 1. Do not eat food after midnight the night before your procedure.                 No gum chewing or hard candies. You may drink clear liquids up to 2 hours                 before you are scheduled to arrive for your surgery- DO not drink clear                 liquids within 2 hours of the start of your surgery.                 Clear Liquids include:  water, apple juice without pulp, clear carbohydrate                 drink such as Clearfast or Gatorade, Black Coffee or Tea (Do not add                 anything to coffee or tea).  __X__2.  On the morning of surgery brush your teeth with toothpaste and water, you                 may rinse your mouth with mouthwash if you wish.  Do not swallow any              toothpaste of mouthwash.     _X__ 3.  No Alcohol for 24 hours before or after surgery.   _X__ 4.  Do Not Smoke or use e-cigarettes For 24 Hours Prior to Your Surgery.                 Do not use any chewable tobacco products for at least 6 hours prior to                 surgery.  ____  5.  Bring all medications with you on the day of surgery if instructed.   __X__  6.  Notify your doctor if there is any change in your medical condition      (cold, fever, infections).     Do not wear jewelry, make-up, hairpins, clips or nail polish. Do not wear lotions, powders, or perfumes.  Do not shave 48 hours prior to surgery. Men may shave face and neck. Do not bring valuables to the hospital.    [facility] is not responsible for any belongings or  valuables.  Contacts, dentures/partials or body piercings may not be worn into surgery. Bring a case for your contacts, glasses or hearing aids, a denture cup will be supplied. Leave your suitcase in the car. After surgery it may be brought to your room. For patients admitted to the hospital, discharge time is determined by your treatment team.   Patients discharged the day of surgery will not be allowed to drive home.   Please read over the following fact sheets that you were given:   MRSA Information  __X__ Take these medicines the morning of surgery with A SIP OF WATER:  1. Atorvastatin  2. esomeprazole  3. Gabapentin  4. Oxybutynin  5.  6.  ____ Fleet Enema (as directed)   __ __ Use CHG Soap/SAGE wipes as directed  ____ Use inhalers on the day of surgery  ____ Stop metformin/Janumet/Farxiga 2 days prior to surgery    ____ Take 1/2 of usual insulin dose the night before surgery. No insulin the morning          of surgery.   ____ Stop Blood Thinners Coumadin/Plavix/Xarelto/Pleta/Pradaxa/Eliquis/Effient/Aspirin  on   Or contact your Surgeon, Cardiologist or Medical Doctor regarding  ability to stop your blood thinners  __X__ Stop Anti-inflammatories 7 days before surgery such as Advil, Ibuprofen, Motrin,  BC or Goodies Powder, Naprosyn, Naproxen, Aleve, Aspirin    __X__ Stop all herbal supplements, fish oil or vitamin E until after surgery.    __X__ Bring C-Pap to the hospital.

## 2017-09-30 NOTE — Pre-Procedure Instructions (Signed)
ECG 12-lead11/06/2017 [place] Component Name Value Ref Range  Vent Rate (bpm) 58   PR Interval (msec) 230   QRS Interval (msec) 86   QT Interval (msec) 448   QTc (msec) 439   Other Result Information  This result has an attachment that is not available.  Result Narrative  Sinus bradycardia 1st degree AV block Otherwise normal ECG When compared with ECG of 11-Jun-2015 10:46, PR interval has increased I reviewed and concur with this report. Electronically signed BE:[id number] MD, Darnell Level ([id number]) on 06/03/2017 9:00:28 AM

## 2017-10-05 ENCOUNTER — Ambulatory Visit
Admission: RE | Admit: 2017-10-05 | Discharge: 2017-10-05 | Disposition: A | Discharge status: Home / Self Care | Payer: Medicare Other | Source: Ambulatory Visit | Attending: Internal Medicine | Admitting: Internal Medicine

## 2017-10-05 DIAGNOSIS — Z1239 Encounter for other screening for malignant neoplasm of breast: Secondary | ICD-10-CM

## 2017-10-05 DIAGNOSIS — Z1231 Encounter for screening mammogram for malignant neoplasm of breast: Secondary | ICD-10-CM | POA: Diagnosis present

## 2017-10-05 HISTORY — DX: Personal history of irradiation: Z92.3

## 2017-10-06 MED ORDER — CEFAZOLIN SODIUM-DEXTROSE 1-4 GM/50ML-% IV SOLN
1.0000 g | INTRAVENOUS | Status: AC
  Administered 2017-10-07: 1 g via INTRAVENOUS

## 2017-10-07 ENCOUNTER — Encounter: Payer: Self-pay | Admitting: *Deleted

## 2017-10-07 ENCOUNTER — Encounter
Admission: RE | Disposition: A | Discharge status: Home / Self Care | Payer: Self-pay | Source: Ambulatory Visit | Attending: Urology

## 2017-10-07 ENCOUNTER — Ambulatory Visit
Admission: RE | Admit: 2017-10-07 | Discharge: 2017-10-07 | Disposition: A | Discharge status: Home / Self Care | Payer: Medicare Other | Source: Ambulatory Visit | Attending: Urology | Admitting: Urology

## 2017-10-07 ENCOUNTER — Ambulatory Visit: Payer: Medicare Other | Admitting: Certified Registered"

## 2017-10-07 ENCOUNTER — Other Ambulatory Visit: Payer: Self-pay

## 2017-10-07 DIAGNOSIS — E1151 Type 2 diabetes mellitus with diabetic peripheral angiopathy without gangrene: Secondary | ICD-10-CM | POA: Diagnosis not present

## 2017-10-07 DIAGNOSIS — M1990 Unspecified osteoarthritis, unspecified site: Secondary | ICD-10-CM | POA: Insufficient documentation

## 2017-10-07 DIAGNOSIS — Z79899 Other long term (current) drug therapy: Secondary | ICD-10-CM | POA: Insufficient documentation

## 2017-10-07 DIAGNOSIS — I120 Hypertensive chronic kidney disease with stage 5 chronic kidney disease or end stage renal disease: Secondary | ICD-10-CM | POA: Insufficient documentation

## 2017-10-07 DIAGNOSIS — Z885 Allergy status to narcotic agent status: Secondary | ICD-10-CM | POA: Insufficient documentation

## 2017-10-07 DIAGNOSIS — K900 Celiac disease: Secondary | ICD-10-CM | POA: Diagnosis not present

## 2017-10-07 DIAGNOSIS — Z888 Allergy status to other drugs, medicaments and biological substances status: Secondary | ICD-10-CM | POA: Insufficient documentation

## 2017-10-07 DIAGNOSIS — Z9981 Dependence on supplemental oxygen: Secondary | ICD-10-CM | POA: Insufficient documentation

## 2017-10-07 DIAGNOSIS — Z923 Personal history of irradiation: Secondary | ICD-10-CM | POA: Insufficient documentation

## 2017-10-07 DIAGNOSIS — N186 End stage renal disease: Secondary | ICD-10-CM | POA: Insufficient documentation

## 2017-10-07 DIAGNOSIS — Z8551 Personal history of malignant neoplasm of bladder: Secondary | ICD-10-CM

## 2017-10-07 DIAGNOSIS — J449 Chronic obstructive pulmonary disease, unspecified: Secondary | ICD-10-CM | POA: Diagnosis not present

## 2017-10-07 DIAGNOSIS — E1122 Type 2 diabetes mellitus with diabetic chronic kidney disease: Secondary | ICD-10-CM | POA: Insufficient documentation

## 2017-10-07 DIAGNOSIS — D494 Neoplasm of unspecified behavior of bladder: Secondary | ICD-10-CM | POA: Diagnosis not present

## 2017-10-07 DIAGNOSIS — I2510 Atherosclerotic heart disease of native coronary artery without angina pectoris: Secondary | ICD-10-CM | POA: Diagnosis not present

## 2017-10-07 DIAGNOSIS — Z9989 Dependence on other enabling machines and devices: Secondary | ICD-10-CM | POA: Insufficient documentation

## 2017-10-07 DIAGNOSIS — G4730 Sleep apnea, unspecified: Secondary | ICD-10-CM | POA: Diagnosis not present

## 2017-10-07 DIAGNOSIS — N3289 Other specified disorders of bladder: Secondary | ICD-10-CM | POA: Insufficient documentation

## 2017-10-07 DIAGNOSIS — K219 Gastro-esophageal reflux disease without esophagitis: Secondary | ICD-10-CM | POA: Diagnosis not present

## 2017-10-07 DIAGNOSIS — Z87891 Personal history of nicotine dependence: Secondary | ICD-10-CM | POA: Diagnosis not present

## 2017-10-07 DIAGNOSIS — N1330 Unspecified hydronephrosis: Secondary | ICD-10-CM | POA: Diagnosis present

## 2017-10-07 DIAGNOSIS — C679 Malignant neoplasm of bladder, unspecified: Secondary | ICD-10-CM

## 2017-10-07 HISTORY — PX: TRANSURETHRAL RESECTION OF BLADDER TUMOR: SHX2575

## 2017-10-07 HISTORY — PX: CYSTOSCOPY W/ RETROGRADES: SHX1426

## 2017-10-07 LAB — POCT I-STAT 4, (NA,K, GLUC, HGB,HCT)
Glucose, Bld: 117 mg/dL — ABNORMAL HIGH (ref 65–99)
HCT: 39 % (ref 36.0–46.0)
Hemoglobin: 13.3 g/dL (ref 12.0–15.0)
POTASSIUM: 3.7 mmol/L (ref 3.5–5.1)
SODIUM: 141 mmol/L (ref 135–145)

## 2017-10-07 LAB — GLUCOSE, CAPILLARY
Glucose-Capillary: 119 mg/dL — ABNORMAL HIGH (ref 65–99)
Glucose-Capillary: 103 mg/dL — ABNORMAL HIGH (ref 65–99)

## 2017-10-07 LAB — POTASSIUM: POTASSIUM: 3.7 mmol/L (ref 3.5–5.1)

## 2017-10-07 SURGERY — TURBT (TRANSURETHRAL RESECTION OF BLADDER TUMOR)
Anesthesia: General | Site: Ureter | Wound class: Clean Contaminated

## 2017-10-07 MED ORDER — FENTANYL CITRATE (PF) 100 MCG/2ML IJ SOLN
INTRAMUSCULAR | Status: DC | PRN
  Administered 2017-10-07 (×2): 25 ug via INTRAVENOUS

## 2017-10-07 MED ORDER — LIDOCAINE HCL (PF) 2 % IJ SOLN
INTRAMUSCULAR | Status: DC | PRN
  Administered 2017-10-07: 80 mg via INTRADERMAL

## 2017-10-07 MED ORDER — CEFAZOLIN SODIUM-DEXTROSE 1-4 GM/50ML-% IV SOLN
INTRAVENOUS | Status: AC
  Filled 2017-10-07: qty 50

## 2017-10-07 MED ORDER — HEPARIN SOD (PORK) LOCK FLUSH 100 UNIT/ML IV SOLN
500.0000 [IU] | Freq: Once | INTRAVENOUS | Status: AC
  Administered 2017-10-07: 500 [IU] via INTRAVENOUS

## 2017-10-07 MED ORDER — FENTANYL CITRATE (PF) 100 MCG/2ML IJ SOLN
INTRAMUSCULAR | Status: AC
  Filled 2017-10-07: qty 2

## 2017-10-07 MED ORDER — FENTANYL CITRATE (PF) 100 MCG/2ML IJ SOLN
INTRAMUSCULAR | Status: AC
  Administered 2017-10-07: 25 ug via INTRAVENOUS
  Filled 2017-10-07: qty 2

## 2017-10-07 MED ORDER — HEPARIN SOD (PORK) LOCK FLUSH 100 UNIT/ML IV SOLN
INTRAVENOUS | Status: AC
  Administered 2017-10-07: 500 [IU] via INTRAVENOUS
  Filled 2017-10-07: qty 5

## 2017-10-07 MED ORDER — ONDANSETRON HCL 4 MG/2ML IJ SOLN
INTRAMUSCULAR | Status: DC | PRN
  Administered 2017-10-07: 4 mg via INTRAVENOUS

## 2017-10-07 MED ORDER — LIDOCAINE HCL (PF) 2 % IJ SOLN
INTRAMUSCULAR | Status: AC
  Filled 2017-10-07: qty 10

## 2017-10-07 MED ORDER — SODIUM CHLORIDE 0.9 % IV SOLN
INTRAVENOUS | Status: DC
  Administered 2017-10-07: 07:00:00 via INTRAVENOUS

## 2017-10-07 MED ORDER — IOTHALAMATE MEGLUMINE 43 % IV SOLN
INTRAVENOUS | Status: DC | PRN
  Administered 2017-10-07: 40 mL via URETHRAL

## 2017-10-07 MED ORDER — FENTANYL CITRATE (PF) 100 MCG/2ML IJ SOLN
25.0000 ug | INTRAMUSCULAR | Status: DC | PRN
  Administered 2017-10-07 (×3): 25 ug via INTRAVENOUS

## 2017-10-07 MED ORDER — PROPOFOL 10 MG/ML IV BOLUS
INTRAVENOUS | Status: AC
  Filled 2017-10-07: qty 20

## 2017-10-07 MED ORDER — ONDANSETRON HCL 4 MG/2ML IJ SOLN
INTRAMUSCULAR | Status: AC
  Filled 2017-10-07: qty 2

## 2017-10-07 MED ORDER — PROPOFOL 10 MG/ML IV BOLUS
INTRAVENOUS | Status: DC | PRN
  Administered 2017-10-07: 100 mg via INTRAVENOUS

## 2017-10-07 MED ORDER — ONDANSETRON HCL 4 MG/2ML IJ SOLN: 4.0000 mg | Freq: Once | INTRAMUSCULAR | Status: DC | PRN

## 2017-10-07 MED ORDER — PHENYLEPHRINE HCL 10 MG/ML IJ SOLN
INTRAMUSCULAR | Status: AC
  Filled 2017-10-07: qty 1

## 2017-10-07 SURGICAL SUPPLY — 34 items
BAG DRAIN CYSTO-URO LG1000N (MISCELLANEOUS) ×4 IMPLANT
BAG URINE DRAINAGE (UROLOGICAL SUPPLIES) ×4 IMPLANT
BRUSH SCRUB EZ  4% CHG (MISCELLANEOUS) ×2
BRUSH SCRUB EZ 4% CHG (MISCELLANEOUS) ×2 IMPLANT
CATH FOLEY 2WAY  5CC 16FR (CATHETERS)
CATH URETL 5X70 OPEN END (CATHETERS) ×4 IMPLANT
CATH URTH 16FR FL 2W BLN LF (CATHETERS) IMPLANT
CONRAY 43 FOR UROLOGY 50M (MISCELLANEOUS) ×4 IMPLANT
DRAPE UTILITY 15X26 TOWEL STRL (DRAPES) ×4 IMPLANT
DRSG TELFA 4X3 1S NADH ST (GAUZE/BANDAGES/DRESSINGS) ×4 IMPLANT
ELECT LOOP 22F BIPOLAR SML (ELECTROSURGICAL)
ELECT REM PT RETURN 9FT ADLT (ELECTROSURGICAL)
ELECTRODE LOOP 22F BIPOLAR SML (ELECTROSURGICAL) IMPLANT
ELECTRODE REM PT RTRN 9FT ADLT (ELECTROSURGICAL) IMPLANT
GLOVE BIO SURGEON STRL SZ 6.5 (GLOVE) ×3 IMPLANT
GLOVE BIO SURGEONS STRL SZ 6.5 (GLOVE) ×1
GOWN STRL REUS W/ TWL LRG LVL3 (GOWN DISPOSABLE) ×4 IMPLANT
GOWN STRL REUS W/TWL LRG LVL3 (GOWN DISPOSABLE) ×4
KIT TURNOVER CYSTO (KITS) ×4 IMPLANT
LOOP CUT BIPOLAR 24F LRG (ELECTROSURGICAL) IMPLANT
NDL SAFETY ECLIPSE 18X1.5 (NEEDLE) ×2 IMPLANT
NEEDLE HYPO 18GX1.5 SHARP (NEEDLE) ×2
PACK CYSTO AR (MISCELLANEOUS) ×4 IMPLANT
SENSORWIRE 0.038 NOT ANGLED (WIRE) ×4
SET CYSTO W/LG BORE CLAMP LF (SET/KITS/TRAYS/PACK) ×4 IMPLANT
SET IRRIG Y TYPE TUR BLADDER L (SET/KITS/TRAYS/PACK) ×4 IMPLANT
SET IRRIGATING DISP (SET/KITS/TRAYS/PACK) ×4 IMPLANT
SOL .9 NS 3000ML IRR  AL (IV SOLUTION)
SOL .9 NS 3000ML IRR UROMATIC (IV SOLUTION) IMPLANT
SURGILUBE 2OZ TUBE FLIPTOP (MISCELLANEOUS) ×4 IMPLANT
SYRINGE IRR TOOMEY STRL 70CC (SYRINGE) ×4 IMPLANT
WATER STERILE IRR 1000ML POUR (IV SOLUTION) ×4 IMPLANT
WATER STERILE IRR 3000ML UROMA (IV SOLUTION) ×4 IMPLANT
WIRE SENSOR 0.038 NOT ANGLED (WIRE) ×2 IMPLANT

## 2017-10-07 NOTE — Anesthesia Preprocedure Evaluation (Signed)
Anesthesia Evaluation  Patient identified by MRN, date of birth, ID band Patient awake    Reviewed: Allergy & Precautions, H&P , NPO status , Patient's Chart, lab work & pertinent test results, reviewed documented beta blocker date and time   History of Anesthesia Complications Negative for: history of anesthetic complications  Airway Mallampati: IV  TM Distance: >3 FB Neck ROM: full    Dental  (+) Missing, Poor Dentition, Dental Advidsory Given   Pulmonary shortness of breath and with exertion, asthma , sleep apnea, Continuous Positive Airway Pressure Ventilation and Oxygen sleep apnea , COPD, Recent URI , Resolved, former smoker,           Cardiovascular Exercise Tolerance: Good hypertension, (-) angina+ CAD and + Peripheral Vascular Disease  (-) Past MI, (-) Cardiac Stents and (-) CABG (-) dysrhythmias (-) Valvular Problems/Murmurs     Neuro/Psych negative neurological ROS  negative psych ROS   GI/Hepatic Neg liver ROS, GERD  ,  Endo/Other  diabetes  Renal/GU ESRF and DialysisRenal disease  negative genitourinary   Musculoskeletal   Abdominal   Peds  Hematology  (+) Blood dyscrasia, anemia ,   Anesthesia Other Findings Past Medical History: No date: Adult celiac disease 2017: Anemia associated with chronic renal failure     Comment:  blood transfusion last week 10/17 No date: Arthritis No date: Asthma 2017: Cancer ([place])     Comment:  bladder No date: Chronic kidney disease No date: CKD (chronic kidney disease)     Comment:  stage IV kidney disease.  dr. Candiss Norse and dr. Holley Raring               follow her No date: COPD (chronic obstructive pulmonary disease) ([place]) No date: Coronary artery disease No date: Diabetes mellitus without complication ([place]) No date: Dialysis patient [facility])     Comment:  Tues, Thurs, Sat No date: Dyspnea     Comment:  with exertion No date: Elevated lipids No date: GERD  (gastroesophageal reflux disease) No date: Hematuria No date: Hypertension No date: Lower back pain No date: Neuropathy No date: Oxygen dependent     Comment:  at hs 2017: Personal history of chemotherapy     Comment:  bladder ca 2017: Personal history of radiation therapy     Comment:  bladder ca No date: Sleep apnea     Comment:  uses cpap 01/2016: Urinary obstruction   Reproductive/Obstetrics negative OB ROS                             Anesthesia Physical Anesthesia Plan  ASA: IV  Anesthesia Plan: General   Post-op Pain Management:    Induction: Intravenous  PONV Risk Score and Plan: 3 and Ondansetron and Dexamethasone  Airway Management Planned: LMA  Additional Equipment:   Intra-op Plan:   Post-operative Plan: Extubation in OR  Informed Consent: I have reviewed the patients History and Physical, chart, labs and discussed the procedure including the risks, benefits and alternatives for the proposed anesthesia with the patient or authorized representative who has indicated his/her understanding and acceptance.   Dental Advisory Given  Plan Discussed with: Anesthesiologist, CRNA and Surgeon  Anesthesia Plan Comments:         Anesthesia Quick Evaluation

## 2017-10-07 NOTE — Transfer of Care (Signed)
Immediate Anesthesia Transfer of Care Note  Patient: Barbara [name]  Procedure(s) Performed: TRANSURETHRAL RESECTION OF BLADDER TUMOR (TURBT)-small (N/A Bladder) CYSTOSCOPY WITH RETROGRADE PYELOGRAM (Bilateral Ureter)  Patient Location: PACU  Anesthesia Type:General  Level of Consciousness: sedated  Airway & Oxygen Therapy: Patient Spontanous Breathing and Patient connected to face mask oxygen  Post-op Assessment: Report given to RN and Post -op Vital signs reviewed and stable  Post vital signs: Reviewed and stable  Last Vitals:  Vitals Value Taken Time  BP 135/68 10/07/2017  8:36 AM  Temp    Pulse 66 10/07/2017  8:36 AM  Resp 10 10/07/2017  8:36 AM  SpO2 98 % 10/07/2017  8:36 AM    Last Pain:  Vitals:   10/07/17 0622  TempSrc: Oral  PainSc: 0-No pain         Complications: No apparent anesthesia complications

## 2017-10-07 NOTE — Anesthesia Procedure Notes (Signed)
Procedure Name: LMA Insertion Performed by: Lance Muss, CRNA Pre-anesthesia Checklist: Patient identified, Patient being monitored, Timeout performed, Emergency Drugs available and Suction available Patient Re-evaluated:Patient Re-evaluated prior to induction Oxygen Delivery Method: Circle system utilized Preoxygenation: Pre-oxygenation with 100% oxygen Induction Type: IV induction Ventilation: Mask ventilation without difficulty LMA: LMA inserted LMA Size: 4.5 Tube type: Oral Number of attempts: 2 Placement Confirmation: positive ETCO2 and breath sounds checked- equal and bilateral Tube secured with: Tape Dental Injury: Teeth and Oropharynx as per pre-operative assessment  Comments: Leak with LMA #3.5. Removed and replaced with LMA #4.5. +BBS, +ETCO2.

## 2017-10-07 NOTE — Discharge Instructions (Signed)
Transurethral Resection of Bladder Tumor (TURBT) or Bladder Biopsy   Definition:  Transurethral Resection of the Bladder Tumor is a surgical procedure used to diagnose and remove tumors within the bladder. TURBT is the most common treatment for early stage bladder cancer.  General instructions:     Your recent bladder surgery requires very little post hospital care but some definite precautions.  Despite the fact that no skin incisions were used, the area around the bladder incisions are raw and covered with scabs to promote healing and prevent bleeding. Certain precautions are needed to insure that the scabs are not disturbed over the next 2-4 weeks while the healing proceeds.  Because the raw surface inside your bladder and the irritating effects of urine you may expect frequency of urination and/or urgency (a stronger desire to urinate) and perhaps even getting up at night more often. This will usually resolve or improve slowly over the healing period. You may see some blood in your urine over the first 6 weeks. Do not be alarmed, even if the urine was clear for a while. Get off your feet and drink lots of fluids until clearing occurs. If you start to pass clots or don't improve call us.  Diet:  You may return to your normal diet immediately. Because of the raw surface of your bladder, alcohol, spicy foods, foods high in acid and drinks with caffeine may cause irritation or frequency and should be used in moderation. To keep your urine flowing freely and avoid constipation, drink plenty of fluids during the day (8-10 glasses). Tip: Avoid cranberry juice because it is very acidic.  Activity:  Your physical activity doesn't need to be restricted. However, if you are very active, you may see some blood in the urine. We suggest that you reduce your activity under the circumstances until the bleeding has stopped.  Bowels:  It is important to keep your bowels regular during the postoperative  period. Straining with bowel movements can cause bleeding. A bowel movement every other day is reasonable. Use a mild laxative if needed, such as milk of magnesia 2-3 tablespoons, or 2 Dulcolax tablets. Call if you continue to have problems. If you had been taking narcotics for pain, before, during or after your surgery, you may be constipated. Take a laxative if necessary.    Medication:  You should resume your pre-surgery medications unless told not to. In addition you may be given an antibiotic to prevent or treat infection. Antibiotics are not always necessary. All medication should be taken as prescribed until the bottles are finished unless you are having an unusual reaction to one of the drugs.   [place], [place] [address] [phone]    AMBULATORY SURGERY  DISCHARGE INSTRUCTIONS   1) The drugs that you were given will stay in your system until tomorrow so for the next 24 hours you should not:  A) Drive an automobile B) Make any legal decisions C) Drink any alcoholic beverage   2) You may resume regular meals tomorrow.  Today it is better to start with liquids and gradually work up to solid foods.  You may eat anything you prefer, but it is better to start with liquids, then soup and crackers, and gradually work up to solid foods.   3) Please notify your doctor immediately if you have any unusual bleeding, trouble breathing, redness and pain at the surgery site, drainage, fever, or pain not relieved by medication.    4) Additional Instructions:  Please contact your physician with any problems or Same Day Surgery at [phone], Monday through Friday 6 am to 4 pm, or [place] at [facility] number at [phone].

## 2017-10-07 NOTE — Anesthesia Post-op Follow-up Note (Signed)
Anesthesia QCDR form completed.        

## 2017-10-07 NOTE — Interval H&P Note (Signed)
History and Physical Interval Note:  10/07/2017 7:23 AM  Barbara [name]  has presented today for surgery, with the diagnosis of BLADDER CANCER,LEFT HYDRONEPHROSIS  The various methods of treatment have been discussed with the patient and family. After consideration of risks, benefits and other options for treatment, the patient has consented to  Procedure(s): TRANSURETHRAL RESECTION OF BLADDER TUMOR (TURBT)-small (N/A) CYSTOSCOPY WITH RETROGRADE PYELOGRAM (Bilateral) as a surgical intervention .  The patient's history has been reviewed, patient examined, no change in status, stable for surgery.  I have reviewed the patient's chart and labs.  Questions were answered to the patient's satisfaction.    RRR CTAB  Hollice Espy

## 2017-10-07 NOTE — Op Note (Signed)
Date of procedure: 10/07/17  Preoperative diagnosis:  1. History of bladder cancer 2. Bilateral hydronephrosis 3. Bladder erythema/bladder lesion  Postoperative diagnosis:  1. Same as above  Procedure: 1. Cystoscopy 2. TURBT, small 3. Bladder biopsy 4. Right retrograde pyelogram  Surgeon: Hollice Espy, MD  Anesthesia: General  Complications: None  Intraoperative findings: Unable to identify and cannulate left UO.  Right UO capacious with severe hydroureteronephrosis without filling defects.  Approximately 1 cm raised shaggy lesion on left bladder neck.  Diffuse additional nonspecific patchy erythema with neovascularity.  EBL: Minimal  Specimens: Left trigone/bladder neck lesion, random bladder biopsies  Drains: None  Indication: Barbara [name] is a 77 y.o. 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  After reviewing the management options for treatment, she elected to proceed with the above surgical procedure(s). We have discussed the potential benefits and risks of the procedure, side effects of the proposed treatment, the likelihood of the patient achieving the goals of the procedure, and any potential problems that might occur during the procedure or recuperation. Informed consent has been obtained.  Description of procedure:  The patient was taken to the operating room and general anesthesia was induced.  The patient was placed in the dorsal lithotomy position, prepped and draped in the usual sterile fashion, and preoperative antibiotics were administered. A preoperative time-out was performed.   A 21 French cystoscope was advanced per urethra into the bladder.  The bladder was carefully inspected and noted to be relatively contracted with small capacity, diffuse areas of patchy hypervascularity and erythema which are nonspecific and likely  related to radiation changes.  In addition to this, there was a lesion on the left bladder neck/trigone which was raised and shaggy in appearance.  He did not have the traditional appearance of a papillary TCC but was suspicious.  It measured approximately 1 cm in size.  At this point in time, cold cup biopsy forceps were used to resect this lesion in a pea-sized fashion which is passed off the field as left bladder neck lesion.  Additional random bladder biopsies were targeted to areas of patchy erythema in ~ 3 diffuse locations.  Bugbee electrocautery was then used to fulgurate the base of all of these lesions.  Hemostasis was adequate.  Next, attention was turned to the right ureteral orifice which was capacious.  A 5 French open-ended ureteral catheter was then inserted just within the UO and a gentle retrograde pyelogram was performed.  This revealed a dilated ureter all the way down to level of the UVJ with hydronephrosis.  There were no filling defects appreciated.  On the left hemitrigone, the UO was unable to be visualized due to presumed scarring of possibly previous resection.  At this point time, the bladder was drained and the scope was removed.  The patient was then clean and dry, repositioned in supine position, reversed from anesthesia, taken to PACU stable condition.  Plan: I will have the patient return in 3 months for cystoscopy.  I will call her with her pathology results.  Hollice Espy, M.D.

## 2017-10-07 NOTE — Anesthesia Postprocedure Evaluation (Signed)
Anesthesia Post Note  Patient: Barbara [name]  Procedure(s) Performed: TRANSURETHRAL RESECTION OF BLADDER TUMOR (TURBT)-small (N/A Bladder) CYSTOSCOPY WITH RETROGRADE PYELOGRAM (Bilateral Ureter)  Patient location during evaluation: PACU Anesthesia Type: General Level of consciousness: awake and alert Pain management: pain level controlled Vital Signs Assessment: post-procedure vital signs reviewed and stable Respiratory status: spontaneous breathing, nonlabored ventilation, respiratory function stable and patient connected to nasal cannula oxygen Cardiovascular status: blood pressure returned to baseline and stable Postop Assessment: no apparent nausea or vomiting Anesthetic complications: no     Last Vitals:  Vitals Value Taken Time  BP    Temp    Pulse    Resp    SpO2      Last Pain:  Vitals:   10/07/17 0935  TempSrc: Temporal  PainSc:                  Martha Clan

## 2017-10-08 LAB — SURGICAL PATHOLOGY

## 2017-10-11 ENCOUNTER — Ambulatory Visit: Payer: Medicare Other

## 2017-10-11 ENCOUNTER — Telehealth: Payer: Self-pay

## 2017-10-11 NOTE — Telephone Encounter (Signed)
Spoke with pt in reference to pathology results and keeping cysto appt. Pt voiced understanding.

## 2017-10-11 NOTE — Telephone Encounter (Signed)
-----   Message from Hollice Espy, MD sent at 10/08/2017 12:09 PM EDT ----- No cancer.  Please follow up with cystoscopy as scheduled in 3 months.   Hollice Espy, MD

## 2017-10-12 ENCOUNTER — Telehealth: Payer: Self-pay | Admitting: Radiology

## 2017-10-12 ENCOUNTER — Ambulatory Visit: Payer: Medicare Other

## 2017-10-12 ENCOUNTER — Ambulatory Visit (INDEPENDENT_AMBULATORY_CARE_PROVIDER_SITE_OTHER): Payer: Medicare Other

## 2017-10-12 ENCOUNTER — Encounter: Payer: Self-pay | Admitting: Urology

## 2017-10-12 VITALS — BP 145/55

## 2017-10-12 DIAGNOSIS — R300 Dysuria: Secondary | ICD-10-CM | POA: Diagnosis not present

## 2017-10-12 LAB — URINALYSIS, COMPLETE
Bilirubin, UA: NEGATIVE
Glucose, UA: NEGATIVE
Ketones, UA: NEGATIVE
NITRITE UA: NEGATIVE
PH UA: 7.5 (ref 5.0–7.5)
Specific Gravity, UA: 1.02 (ref 1.005–1.030)
UUROB: 0.2 mg/dL (ref 0.2–1.0)

## 2017-10-12 LAB — MICROSCOPIC EXAMINATION

## 2017-10-12 NOTE — Telephone Encounter (Signed)
Pt called stating she would like an antibiotic called to her pharmacy. Explained to pt she will first need a urine culture to be sure she is given the correct medication. Pt added to nurse schedule for ua & ucx today.

## 2017-10-12 NOTE — Progress Notes (Signed)
Patient complaining of urgency and dysuria post op TURBT.  PVR- 66m Urine was sent for culture, will call with culture results.

## 2017-10-15 ENCOUNTER — Other Ambulatory Visit: Payer: Self-pay | Admitting: Urology

## 2017-10-15 LAB — CULTURE, URINE COMPREHENSIVE

## 2017-10-15 MED ORDER — CIPROFLOXACIN HCL 250 MG PO TABS: 250.0000 mg | ORAL_TABLET | Freq: Two times a day (BID) | ORAL | 0 refills | Status: DC

## 2017-10-15 NOTE — Progress Notes (Signed)
I have spoken to [name] and have sent a prescription for Cipro 250 mg bid for seven days to the [facility] on [facility].

## 2017-10-18 ENCOUNTER — Telehealth: Payer: Self-pay

## 2017-10-18 NOTE — Telephone Encounter (Signed)
-----   Message from Nori Riis, PA-C sent at 10/15/2017  4:13 PM EDT ----- Mrs. Gilbo has a positive urine culture, but it is only in preliminary status.  I would like her to start Augmentin 875/125, bid for seven days as the weekend is coming up and we most likely will not get a final result until Monday.

## 2017-10-18 NOTE — Telephone Encounter (Signed)
Per Barbara [name], PAC patient was contacted on Friday evening and script was switched to Cipro , patient was notified

## 2017-12-03 ENCOUNTER — Telehealth: Payer: Self-pay | Admitting: Urology

## 2017-12-03 NOTE — Telephone Encounter (Signed)
Per Dr. Erlene Quan, okay to restart epogen.  Dr. Candiss Norse notified.

## 2017-12-03 NOTE — Telephone Encounter (Signed)
Dr. Candiss Norse [phone]) from the [facility]ialysis Center called this morning about this patient.  She is being seen in the dialysis center today.  Dr. Candiss Norse would like to know if you think it is okay for her to restart the patient on epogen to increase her hemoglobin.  Please advise.

## 2017-12-06 ENCOUNTER — Encounter: Payer: Self-pay | Admitting: Podiatry

## 2017-12-06 ENCOUNTER — Ambulatory Visit (INDEPENDENT_AMBULATORY_CARE_PROVIDER_SITE_OTHER): Payer: Medicare Other | Admitting: Podiatry

## 2017-12-06 DIAGNOSIS — M79675 Pain in left toe(s): Secondary | ICD-10-CM | POA: Diagnosis not present

## 2017-12-06 DIAGNOSIS — M79674 Pain in right toe(s): Secondary | ICD-10-CM

## 2017-12-06 DIAGNOSIS — B351 Tinea unguium: Secondary | ICD-10-CM | POA: Diagnosis not present

## 2017-12-06 DIAGNOSIS — E1142 Type 2 diabetes mellitus with diabetic polyneuropathy: Secondary | ICD-10-CM | POA: Diagnosis not present

## 2017-12-06 NOTE — Progress Notes (Signed)
Complaint:  Visit Type: Patient returns to my office for continued preventative foot care services. Complaint: Patient states" my nails have grown long and thick and become painful to walk and wear shoes" Patient has been diagnosed with DM with no foot complications. The patient presents for preventative foot care services. No changes to ROS  Podiatric Exam: Vascular: dorsalis pedis and posterior tibial pulses are palpable bilateral. Capillary return is immediate. Temperature gradient is WNL. Skin turgor WNL  Sensorium: Diminished  Semmes Weinstein monofilament test. Normal tactile sensation bilaterally. Nail Exam: Pt has thick disfigured discolored nails with subungual debris noted bilateral entire nail hallux through fifth toenails Ulcer Exam: There is no evidence of ulcer or pre-ulcerative changes or infection. Orthopedic Exam: Muscle tone and strength are WNL. No limitations in general ROM. No crepitus or effusions noted. Foot type and digits show no abnormalities. Bony prominences are unremarkable. Skin: No Porokeratosis. No infection or ulcers  Diagnosis:  Onychomycosis, , Pain in right toe, pain in left toes  Treatment & Plan Procedures and Treatment: Consent by patient was obtained for treatment procedures.   Debridement of mycotic and hypertrophic toenails, 1 through 5 bilateral and clearing of subungual debris. No ulceration, no infection noted.  Return Visit-Office Procedure: Patient instructed to return to the office for a follow up visit 3 months for continued evaluation and treatment.    Gardiner Barefoot DPM

## 2017-12-28 ENCOUNTER — Encounter: Payer: Self-pay | Admitting: Urology

## 2017-12-28 ENCOUNTER — Ambulatory Visit (INDEPENDENT_AMBULATORY_CARE_PROVIDER_SITE_OTHER): Payer: Medicare Other | Admitting: Urology

## 2017-12-28 VITALS — BP 108/45 | HR 82

## 2017-12-28 DIAGNOSIS — C679 Malignant neoplasm of bladder, unspecified: Secondary | ICD-10-CM

## 2017-12-28 LAB — URINALYSIS, COMPLETE
Bilirubin, UA: NEGATIVE
GLUCOSE, UA: NEGATIVE
KETONES UA: NEGATIVE
NITRITE UA: NEGATIVE
Specific Gravity, UA: 1.015 (ref 1.005–1.030)
UUROB: 0.2 mg/dL (ref 0.2–1.0)
pH, UA: 8 — ABNORMAL HIGH (ref 5.0–7.5)

## 2017-12-28 LAB — MICROSCOPIC EXAMINATION

## 2017-12-28 NOTE — Progress Notes (Signed)
Msg sent to schedule the pet scan and apt with Dr. Jacinto Reap

## 2017-12-28 NOTE — Progress Notes (Signed)
   12/28/17  CC:  Chief Complaint  Patient presents with  . Cysto    HPI: 77 year old female with history of muscle invasive bladder cancer diagnosed in 01/2015 status post TURBT followed by palliative gemcitabine and concurrent radiation.    She also underwent recent CT PET scan on 01/01/2017 which shows marked bladder wall thickening and bilateral hydronephrosis despite ureteral stents in place. There is no evidence of metastatic disease. She is being followed closely by the cancer center as well.  She was taken to the operating room on 10/12/2016 at which time the bladder was noted to be diffusely erythematous, small in capacity with newly identified right hydroureteronephrosis down to level of the distal ureter with a negative ureteroscopy. This possibly related to radiation changes of the distal ureter. Bilateral ureteral stents replaced at that time.  She was started on hemodialysis. 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  There is no evidence of malignancy.  The left UO was scarred down and unable to be cannulated at the time of retrograde pyelogram.  She returned to the operating room for repeat bladder biopsy on 10/07/2017.  Pathology again negative for malignancy, consisten with radiation change.  Minimal urination recently, occasional less severe dysuria.    Blood pressure 129/62, pulse 84, height 5' 8"  (1.727 m), weight 161 lb 14.4 oz (73.4 kg). NED. A&Ox3.   No respiratory distress   Abd soft, NT, ND Normal external genitalia with patent urethral meatus  Cystoscopy Procedure Note  Patient identification was confirmed, informed consent was obtained, and patient was prepped using Betadine solution.  Lidocaine jelly was administered per urethral meatus.    Preoperative abx where received prior to  procedure.    Procedure: - Flexible cystoscope introduced, without any difficulty.   - Thorough search of the bladder revealed:    Pipe like urethral meatus Mildly trabeculated bladder Multiple diffuse patches of bladder erythema with ulceration consistent with known radiation changes, stable Trigone distored  Post-Procedure: - Cystoscopy well tolerated today  Assessment/ Plan:  1.  History of bladder cancer/bladder lesions NED today- bladder c/w radiation changes Continue surveillance cystoscopy, will decrease interval to q6 month in absence of recurrence Overdue to cancer center f/u and PET scan, will arrange for her to get plugged back in (message to GU navigator) - Urinalysis, Complete - ciprofloxacin (CIPRO) tablet 500 mg; Take 1 tablet (500 mg total) by mouth once. - lidocaine (XYLOCAINE) 2 % jelly 1 application; Place 1 application into the urethra once. - Microscopic Examination  2. Hydronephrosis Likely related to bilateral radiation changes of the ureter/bladder No further ureteral stenting given progression of end-stage renal disease now on hemodialysis  Return in about 6 months (around 06/29/2018) for cysto.   Hollice Espy, MD

## 2017-12-29 ENCOUNTER — Telehealth: Payer: Self-pay | Admitting: *Deleted

## 2017-12-29 NOTE — Telephone Encounter (Signed)
Have tired several times to call pt, no VM set up so I mailed her appts to her.

## 2018-01-05 ENCOUNTER — Other Ambulatory Visit: Payer: Medicare Other

## 2018-01-09 ENCOUNTER — Telehealth: Payer: Self-pay | Admitting: Internal Medicine

## 2018-01-09 NOTE — Telephone Encounter (Signed)
Barbara [name]- please check with pt/family re: her plans for follow up with [place] and / PET scan scheduling; looks like collete was having problems reaching out to family scheduling. Thx.   GB

## 2018-01-10 ENCOUNTER — Telehealth: Payer: Self-pay | Admitting: Internal Medicine

## 2018-01-10 DIAGNOSIS — C670 Malignant neoplasm of trigone of bladder: Secondary | ICD-10-CM

## 2018-01-10 NOTE — Telephone Encounter (Signed)
I will order CT C/A/P non-contrast for further evaluation; follow up with me few days later to review the results/ labs-cbc/cmp. Thx GB

## 2018-01-11 ENCOUNTER — Other Ambulatory Visit: Payer: Medicare Other | Admitting: Urology

## 2018-01-11 ENCOUNTER — Telehealth: Payer: Self-pay

## 2018-01-11 NOTE — Telephone Encounter (Signed)
Received call back from [name]. Educated on the need for CT scan 7-3. Instructed to arrive at 1345 in the medical mall and go to the registration desk. Her scan is at 1400. She was instrucuted not to eat or drink anything 4 hours prior to scan. She will need to pick up prep the day before on 7-2 at the radiology desk in the medical mall. They will instruct her on how to drink contrast. Follow up with Dr. Rogue Bussing has been arranged for 7/8 at 0915 with labs/physician visit at the cancer center. These appointments and instructions were also reviewed with her spouse. Oncology Nurse Navigator Documentation  Navigator Location: [facility] (01/11/18 0900)   )Navigator Encounter Type: Telephone (01/11/18 0900) Telephone: Incoming Call;Appt Confirmation/Clarification (01/11/18 0900)                                                  Time Spent with Patient: 15 (01/11/18 0900)

## 2018-01-11 NOTE — Telephone Encounter (Signed)
Unable to reach Ms. Bianchini as well. Noted that appointments have been mailed to her home address. Voicemail left with daugther, Norberto Sorenson, to return call to review appointments and receive patient update.

## 2018-01-12 ENCOUNTER — Ambulatory Visit: Payer: Medicare Other | Admitting: Internal Medicine

## 2018-01-19 ENCOUNTER — Ambulatory Visit
Admission: RE | Admit: 2018-01-19 | Discharge: 2018-01-19 | Disposition: A | Discharge status: Home / Self Care | Payer: Medicare Other | Source: Ambulatory Visit | Attending: Internal Medicine | Admitting: Internal Medicine

## 2018-01-19 DIAGNOSIS — N1330 Unspecified hydronephrosis: Secondary | ICD-10-CM | POA: Insufficient documentation

## 2018-01-19 DIAGNOSIS — C670 Malignant neoplasm of trigone of bladder: Secondary | ICD-10-CM | POA: Diagnosis present

## 2018-01-24 ENCOUNTER — Ambulatory Visit: Payer: Medicare Other | Admitting: Internal Medicine

## 2018-01-24 ENCOUNTER — Other Ambulatory Visit: Payer: Medicare Other

## 2018-01-24 ENCOUNTER — Ambulatory Visit (INDEPENDENT_AMBULATORY_CARE_PROVIDER_SITE_OTHER): Payer: Medicare Other | Admitting: Vascular Surgery

## 2018-01-24 ENCOUNTER — Encounter (INDEPENDENT_AMBULATORY_CARE_PROVIDER_SITE_OTHER): Payer: Medicare Other

## 2018-01-27 ENCOUNTER — Inpatient Hospital Stay: Payer: Medicare Other | Admitting: Internal Medicine

## 2018-01-27 ENCOUNTER — Encounter (INDEPENDENT_AMBULATORY_CARE_PROVIDER_SITE_OTHER): Payer: Self-pay | Admitting: Vascular Surgery

## 2018-01-27 ENCOUNTER — Inpatient Hospital Stay: Payer: Medicare Other | Attending: Internal Medicine

## 2018-01-27 ENCOUNTER — Ambulatory Visit (INDEPENDENT_AMBULATORY_CARE_PROVIDER_SITE_OTHER): Payer: Medicare Other | Admitting: Vascular Surgery

## 2018-01-27 ENCOUNTER — Ambulatory Visit (INDEPENDENT_AMBULATORY_CARE_PROVIDER_SITE_OTHER): Payer: Medicare Other

## 2018-01-27 VITALS — BP 115/52 | HR 77 | Resp 16 | Ht 66.0 in | Wt 184.0 lb

## 2018-01-27 DIAGNOSIS — I10 Essential (primary) hypertension: Secondary | ICD-10-CM

## 2018-01-27 DIAGNOSIS — T829XXS Unspecified complication of cardiac and vascular prosthetic device, implant and graft, sequela: Secondary | ICD-10-CM | POA: Diagnosis not present

## 2018-01-27 DIAGNOSIS — E118 Type 2 diabetes mellitus with unspecified complications: Secondary | ICD-10-CM | POA: Diagnosis not present

## 2018-01-27 DIAGNOSIS — N186 End stage renal disease: Secondary | ICD-10-CM

## 2018-01-27 DIAGNOSIS — I2510 Atherosclerotic heart disease of native coronary artery without angina pectoris: Secondary | ICD-10-CM

## 2018-01-27 NOTE — Assessment & Plan Note (Deleted)
#   muscle invasive bladder cancer/c T2/stage II- not a candidate for cystectomy given multiple comorbidities s/p RT and weekly gemx2. Discontinued due to poor tolerance 06/2016.   # Repeat cystoscopy [id number] - neg for recurrence. Last PET scan 01/01/17 was negative for metastatic disease.  Plan a PET scan in approximately 3 months.  #anemia/hemoglobin 10.4; continue p.o. iron.  #End-stage renal disease on dialysis.   # port flush every 2 months.   # follow up in 3 months with labs; PET scan prior; port flush.

## 2018-01-27 NOTE — Progress Notes (Deleted)
[place] OFFICE PROGRESS NOTE  Patient Care Team: [name], MD as PCP - General (Internal Medicine)  Cancer Staging No matching staging information was found for the patient.   Oncology History    #AUG 2017 TURBT- [Dr.Brandon] Large nodular proximally 5 cm bladder mass involving the left hemitrigone extending up the left lateral bladder wall, 2 discrete nodules with bridge of abnormal tissue; Tumor clinically T2. TURBT- Tumor invades muscularis propria [detrusor muscle]. NOT candidate for cystectomy.  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  female pleasant patient above history of  Review of Systems  Constitutional: Negative for chills, diaphoresis, fever, malaise/fatigue and weight loss.  HENT: Negative for nosebleeds and sore throat.   Eyes: Negative for double vision.  Respiratory: Negative for cough, hemoptysis, sputum production, shortness of breath and wheezing.   Cardiovascular: Negative for chest pain, palpitations, orthopnea and leg swelling.  Gastrointestinal: Negative for abdominal pain, blood in stool, constipation, diarrhea, heartburn, melena, nausea and vomiting.  Genitourinary: Negative for dysuria, frequency and urgency.  Musculoskeletal: Negative for back pain and joint pain.  Skin: Negative.  Negative for itching and rash.  Neurological: Negative for dizziness, tingling, focal weakness, weakness and headaches.  Endo/Heme/Allergies: Does not bruise/bleed easily.  Psychiatric/Behavioral: Negative for depression. The patient is not nervous/anxious and does not have insomnia.       PAST MEDICAL HISTORY :  Past Medical History:  Diagnosis Date  . Adult celiac disease   . Anemia associated with chronic renal failure 2017   blood  transfusion last week 10/17  . Arthritis   . Asthma   . Cancer ([place]) 2017   bladder  . Chronic kidney disease   . 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  dr. Candiss Norse and dr. Holley Raring follow her  . COPD (chronic obstructive pulmonary disease) ([place])   . Coronary artery disease   . Diabetes mellitus without complication ([place])   . Dialysis patient ([place])    Tues, Thurs, Sat  . Dyspnea    with exertion  . Elevated lipids   . GERD (gastroesophageal reflux disease)   . Hematuria   . Hypertension   . Lower back pain   . Neuropathy   . Oxygen dependent    at hs  . Personal history of chemotherapy 2017   bladder ca  . Personal history of radiation therapy 2017   bladder ca  . Sleep apnea    uses cpap  . Urinary obstruction 01/2016    PAST SURGICAL HISTORY :   Past Surgical History:  Procedure Laterality Date  . ABDOMINAL HYSTERECTOMY    . AV FISTULA PLACEMENT Left 02/17/2017   Procedure: INSERTION OF ARTERIOVENOUS (AV) GORE-TEX GRAFT ARM(BRACHIAL AXILLARY);  Surgeon: Katha Cabal, MD;  Location: [facility];  Service: Vascular;  Laterality: Left;  . CYSTOSCOPY W/ RETROGRADES Bilateral 02/17/2016   Procedure: CYSTOSCOPY WITH RETROGRADE PYELOGRAM;  Surgeon: Hollice Espy, MD;  Location: [facility];  Service: Urology;  Laterality: Bilateral;  . CYSTOSCOPY W/ RETROGRADES Bilateral 10/12/2016   Procedure: CYSTOSCOPY WITH RETROGRADE PYELOGRAM;  Surgeon: Hollice Espy, MD;  Location: [facility];  Service: Urology;  Laterality: Bilateral;  . CYSTOSCOPY W/ RETROGRADES Bilateral 06/09/2017   Procedure: CYSTOSCOPY WITH RETROGRADE  PYELOGRAM;  Surgeon: Hollice Espy, MD;  Location: [facility];  Service: Urology;  Laterality: Bilateral;  . CYSTOSCOPY W/ RETROGRADES Bilateral 10/07/2017   Procedure: CYSTOSCOPY WITH RETROGRADE PYELOGRAM;  Surgeon: Hollice Espy, MD;  Location: [facility];  Service: Urology;  Laterality: Bilateral;  . CYSTOSCOPY W/ URETERAL STENT PLACEMENT Left 05/12/2016    Procedure: CYSTOSCOPY WITH STENT REPLACEMENT;  Surgeon: Hollice Espy, MD;  Location: [facility];  Service: Urology;  Laterality: Left;  . CYSTOSCOPY W/ URETERAL STENT PLACEMENT Left 10/12/2016   Procedure: CYSTOSCOPY WITH STENT REPLACEMENT;  Surgeon: Hollice Espy, MD;  Location: [facility];  Service: Urology;  Laterality: Left;  . CYSTOSCOPY WITH BIOPSY N/A 06/09/2017   Procedure: CYSTOSCOPY WITH BLADDER BIOPSY;  Surgeon: Hollice Espy, MD;  Location: [facility];  Service: Urology;  Laterality: N/A;  . CYSTOSCOPY WITH STENT PLACEMENT Left 01/21/2016   Procedure: CYSTOSCOPY WITH double J STENT PLACEMENT;  Surgeon: Franchot Gallo, MD;  Location: [facility];  Service: Urology;  Laterality: Left;  . CYSTOSCOPY WITH STENT PLACEMENT Right 10/12/2016   Procedure: CYSTOSCOPY WITH STENT PLACEMENT;  Surgeon: Hollice Espy, MD;  Location: [facility];  Service: Urology;  Laterality: Right;  . DIALYSIS/PERMA CATHETER INSERTION N/A 11/20/2016   Procedure: Dialysis/Perma Catheter Insertion;  Surgeon: Algernon Huxley, MD;  Location: [place] [facility];  Service: Cardiovascular;  Laterality: N/A;  . DIALYSIS/PERMA CATHETER REMOVAL N/A 03/30/2017   Procedure: DIALYSIS/PERMA CATHETER REMOVAL;  Surgeon: Katha Cabal, MD;  Location: [place] [facility];  Service: Cardiovascular;  Laterality: N/A;  . KIDNEY SURGERY  01/21/2016   IR NEPHROSTOMY PLACEMENT LEFT   . PERIPHERAL VASCULAR CATHETERIZATION N/A 04/07/2016   Procedure: Glori Luis Cath Insertion;  Surgeon: Katha Cabal, MD;  Location: [place] [facility];  Service: Cardiovascular;  Laterality: N/A;  . PERIPHERAL VASCULAR THROMBECTOMY Left 06/02/2017   Procedure: PERIPHERAL VASCULAR THROMBECTOMY;  Surgeon: Algernon Huxley, MD;  Location: [place] [facility];  Service: Cardiovascular;  Laterality: Left;  . PORTACATH PLACEMENT Right   . ROTATOR CUFF REPAIR Left   . TRANSURETHRAL RESECTION OF BLADDER TUMOR N/A 02/17/2016   Procedure: TRANSURETHRAL RESECTION OF  BLADDER TUMOR (TURBT)-LARGE;  Surgeon: Hollice Espy, MD;  Location: [facility];  Service: Urology;  Laterality: N/A;  . TRANSURETHRAL RESECTION OF BLADDER TUMOR N/A 10/07/2017   Procedure: TRANSURETHRAL RESECTION OF BLADDER TUMOR (TURBT)-small;  Surgeon: Hollice Espy, MD;  Location: [facility];  Service: Urology;  Laterality: N/A;  . URETEROSCOPY Left 02/17/2016   Procedure: URETEROSCOPY;  Surgeon: Hollice Espy, MD;  Location: [facility];  Service: Urology;  Laterality: Left;  . URETEROSCOPY Right 10/12/2016   Procedure: URETEROSCOPY;  Surgeon: Hollice Espy, MD;  Location: [facility];  Service: Urology;  Laterality: Right;    FAMILY HISTORY :   Family History  Problem Relation Age of Onset  . Diabetes Mother   . Cancer Father   . Breast cancer Neg Hx   . Kidney disease Neg Hx     SOCIAL HISTORY:   Social History   Tobacco Use  . Smoking status: Former Smoker    Types: Cigarettes    Last attempt to quit: 07/20/2002    Years since quitting: 15.5  . Smokeless tobacco: Never Used  . Tobacco comment: 01/22/2016   "  quit smoking many years ago "  Substance Use Topics  . Alcohol use: No  . Drug use: No    ALLERGIES:  is allergic to glipizide er and percocet [oxycodone-acetaminophen].  MEDICATIONS:  Current Outpatient Medications  Medication Sig  Dispense Refill  . ACCU-CHEK AVIVA PLUS test strip 1 each by Other route daily. use as directed  3  . ACCU-CHEK SOFTCLIX LANCETS lancets INJECT 1 EACH AS DIRECTED ONCE DAILY.  3  . acetaminophen (TYLENOL) 500 MG tablet Take 1,000 mg by mouth 2 (two) times daily as needed for moderate pain or headache.     . Albuterol Sulfate (PROAIR RESPICLICK) 782 (90 Base) MCG/ACT AEPB Inhale 2 puffs into the lungs 4 (four) times daily as needed (wheezing).     Marland Kitchen amoxicillin-clavulanate (AUGMENTIN) 875-125 MG tablet Take 1 tablet by mouth every 12 (twelve) hours. 14 tablet 0  . atorvastatin (LIPITOR) 40 MG tablet Take 40 mg by mouth daily.   5  . calcium  acetate (PHOSLO) 667 MG capsule Take 2 capsules (1,334 mg total) by mouth 3 (three) times daily with meals. (Patient taking differently: Take 2,001 mg by mouth 3 (three) times daily with meals. May take 2001 mg with each snack) 90 capsule 0  . cholecalciferol (VITAMIN D) 1000 units tablet Take 1,000 Units by mouth daily.    . cinacalcet (SENSIPAR) 30 MG tablet Take 30 mg by mouth daily.    Marland Kitchen esomeprazole (NEXIUM) 40 MG capsule Take 40 mg by mouth daily after breakfast.     . ferrous sulfate 325 (65 FE) MG EC tablet Take 325 mg by mouth daily after breakfast.     . furosemide (LASIX) 40 MG tablet Take 40 mg by mouth 2 (two) times daily. Take morning dose every day. Lunch dose as needed.    . gabapentin (NEURONTIN) 300 MG capsule Take 600 mg by mouth 3 (three) times daily.    Marland Kitchen lidocaine (XYLOCAINE) 5 % ointment Apply 1 application topically 2 (two) times daily as needed for mild pain.     . NON FORMULARY Place 1 Units into the nose at bedtime. CPAP Time of use 2100-0600    . oxybutynin (DITROPAN) 5 MG tablet Take 5 mg by mouth 3 (three) times daily.    . OXYGEN Inhale 2 L into the lungs at bedtime.    . TRAVATAN Z 0.004 % SOLN ophthalmic solution PLACE 1 DROP INTO BOTH EYES EVERY NIGHT AT BEDTIME  5   No current facility-administered medications for this visit.     PHYSICAL EXAMINATION: ECOG PERFORMANCE STATUS: {CHL ONC ECOG PS:[id number]}  There were no vitals taken for this visit.  There were no vitals filed for this visit.  GENERAL: Well-nourished well-developed; Alert, no distress and comfortable.  *** Alone/Accompanied by family.  EYES: no pallor or icterus OROPHARYNX: no thrush or ulceration; NECK: supple; no lymph nodes felt. LYMPH:  no palpable lymphadenopathy in the axillary or inguinal regions LUNGS: Decreased breath sounds auscultation bilaterally. No wheeze or crackles HEART/CVS: regular rate & rhythm and no murmurs; No lower extremity edema ABDOMEN:abdomen soft, non-tender  and normal bowel sounds. No hepatomegaly or splenomegaly.  Musculoskeletal:no cyanosis of digits and no clubbing  PSYCH: alert & oriented x 3 with fluent speech NEURO: no focal motor/sensory deficits SKIN:  no rashes or significant lesions    LABORATORY DATA:  I have reviewed the data as listed    Component Value Date/Time   NA 141 10/07/2017 0643   NA 138 11/04/2016 1453   NA 138 08/07/2012 0433   K 3.7 10/07/2017 0643   K 3.2 (L) 10/30/2014 1410   CL 102 09/03/2017 1140   CL 104 08/07/2012 0433   CO2 23 09/03/2017 1140   CO2 25 08/07/2012 0433  GLUCOSE 117 (H) 10/07/2017 0643   GLUCOSE 172 (H) 08/07/2012 0433   BUN 35 (H) 09/03/2017 1140   BUN 69 (H) 11/04/2016 1453   BUN 63 (H) 08/07/2012 0433   CREATININE 6.01 (H) 09/03/2017 1140   CREATININE 2.48 (H) 08/07/2012 0433   CALCIUM 8.3 (L) 09/03/2017 1140   CALCIUM 9.1 08/07/2012 0433   PROT 8.7 (H) 09/03/2017 1140   PROT 8.9 (H) 08/02/2012 0958   ALBUMIN 4.1 09/03/2017 1140   ALBUMIN 3.9 08/02/2012 0958   AST 18 09/03/2017 1140   AST 21 08/02/2012 0958   ALT 12 (L) 09/03/2017 1140   ALT 19 08/02/2012 0958   ALKPHOS 167 (H) 09/03/2017 1140   ALKPHOS 107 08/02/2012 0958   BILITOT 0.6 09/03/2017 1140   BILITOT 0.3 08/02/2012 0958   GFRNONAA 6 (L) 09/03/2017 1140   GFRNONAA 19 (L) 08/07/2012 0433   GFRAA 7 (L) 09/03/2017 1140   GFRAA 22 (L) 08/07/2012 0433    No results found for: SPEP, UPEP  Lab Results  Component Value Date   WBC 7.5 09/03/2017   NEUTROABS 2.4 06/14/2017   HGB 13.3 10/07/2017   HCT 39.0 10/07/2017   MCV 96.4 09/03/2017   PLT 129 (L) 09/03/2017      Chemistry      Component Value Date/Time   NA 141 10/07/2017 0643   NA 138 11/04/2016 1453   NA 138 08/07/2012 0433   K 3.7 10/07/2017 0643   K 3.2 (L) 10/30/2014 1410   CL 102 09/03/2017 1140   CL 104 08/07/2012 0433   CO2 23 09/03/2017 1140   CO2 25 08/07/2012 0433   BUN 35 (H) 09/03/2017 1140   BUN 69 (H) 11/04/2016 1453   BUN 63  (H) 08/07/2012 0433   CREATININE 6.01 (H) 09/03/2017 1140   CREATININE 2.48 (H) 08/07/2012 0433      Component Value Date/Time   CALCIUM 8.3 (L) 09/03/2017 1140   CALCIUM 9.1 08/07/2012 0433   ALKPHOS 167 (H) 09/03/2017 1140   ALKPHOS 107 08/02/2012 0958   AST 18 09/03/2017 1140   AST 21 08/02/2012 0958   ALT 12 (L) 09/03/2017 1140   ALT 19 08/02/2012 0958   BILITOT 0.6 09/03/2017 1140   BILITOT 0.3 08/02/2012 0958       RADIOGRAPHIC STUDIES: I have personally reviewed the radiological images as listed and agreed with the findings in the report. No results found.   ASSESSMENT & PLAN:  No problem-specific Assessment & Plan notes found for this encounter.   No orders of the defined types were placed in this encounter.  All questions were answered. The patient knows to call the clinic with any problems, questions or concerns.      Cammie Sickle, MD 01/27/2018 8:36 AM

## 2018-01-28 ENCOUNTER — Encounter (INDEPENDENT_AMBULATORY_CARE_PROVIDER_SITE_OTHER): Payer: Self-pay | Admitting: Vascular Surgery

## 2018-01-28 ENCOUNTER — Encounter: Payer: Self-pay | Admitting: Anesthesiology

## 2018-01-28 ENCOUNTER — Encounter: Payer: Self-pay | Admitting: *Deleted

## 2018-01-28 ENCOUNTER — Ambulatory Visit
Admission: RE | Admit: 2018-01-28 | Discharge: 2018-01-28 | Disposition: A | Discharge status: Home / Self Care | Payer: Medicare Other | Source: Ambulatory Visit | Attending: Vascular Surgery | Admitting: Vascular Surgery

## 2018-01-28 ENCOUNTER — Other Ambulatory Visit (INDEPENDENT_AMBULATORY_CARE_PROVIDER_SITE_OTHER): Payer: Self-pay

## 2018-01-28 ENCOUNTER — Encounter
Admission: RE | Disposition: A | Discharge status: Home / Self Care | Payer: Self-pay | Source: Ambulatory Visit | Attending: Vascular Surgery

## 2018-01-28 DIAGNOSIS — Z9221 Personal history of antineoplastic chemotherapy: Secondary | ICD-10-CM | POA: Insufficient documentation

## 2018-01-28 DIAGNOSIS — Z960 Presence of urogenital implants: Secondary | ICD-10-CM | POA: Diagnosis not present

## 2018-01-28 DIAGNOSIS — D631 Anemia in chronic kidney disease: Secondary | ICD-10-CM | POA: Diagnosis not present

## 2018-01-28 DIAGNOSIS — I2510 Atherosclerotic heart disease of native coronary artery without angina pectoris: Secondary | ICD-10-CM | POA: Insufficient documentation

## 2018-01-28 DIAGNOSIS — Z885 Allergy status to narcotic agent status: Secondary | ICD-10-CM | POA: Diagnosis not present

## 2018-01-28 DIAGNOSIS — E1122 Type 2 diabetes mellitus with diabetic chronic kidney disease: Secondary | ICD-10-CM | POA: Diagnosis not present

## 2018-01-28 DIAGNOSIS — K219 Gastro-esophageal reflux disease without esophagitis: Secondary | ICD-10-CM | POA: Diagnosis not present

## 2018-01-28 DIAGNOSIS — M1990 Unspecified osteoarthritis, unspecified site: Secondary | ICD-10-CM | POA: Diagnosis not present

## 2018-01-28 DIAGNOSIS — Z9889 Other specified postprocedural states: Secondary | ICD-10-CM | POA: Diagnosis not present

## 2018-01-28 DIAGNOSIS — Z87891 Personal history of nicotine dependence: Secondary | ICD-10-CM | POA: Insufficient documentation

## 2018-01-28 DIAGNOSIS — N186 End stage renal disease: Secondary | ICD-10-CM | POA: Insufficient documentation

## 2018-01-28 DIAGNOSIS — I120 Hypertensive chronic kidney disease with stage 5 chronic kidney disease or end stage renal disease: Secondary | ICD-10-CM | POA: Diagnosis not present

## 2018-01-28 DIAGNOSIS — K900 Celiac disease: Secondary | ICD-10-CM | POA: Diagnosis not present

## 2018-01-28 DIAGNOSIS — Z90710 Acquired absence of both cervix and uterus: Secondary | ICD-10-CM | POA: Diagnosis not present

## 2018-01-28 DIAGNOSIS — E1140 Type 2 diabetes mellitus with diabetic neuropathy, unspecified: Secondary | ICD-10-CM | POA: Insufficient documentation

## 2018-01-28 DIAGNOSIS — Z833 Family history of diabetes mellitus: Secondary | ICD-10-CM | POA: Insufficient documentation

## 2018-01-28 DIAGNOSIS — I871 Compression of vein: Secondary | ICD-10-CM | POA: Diagnosis not present

## 2018-01-28 DIAGNOSIS — J449 Chronic obstructive pulmonary disease, unspecified: Secondary | ICD-10-CM | POA: Insufficient documentation

## 2018-01-28 DIAGNOSIS — Z8551 Personal history of malignant neoplasm of bladder: Secondary | ICD-10-CM | POA: Insufficient documentation

## 2018-01-28 DIAGNOSIS — Z923 Personal history of irradiation: Secondary | ICD-10-CM | POA: Insufficient documentation

## 2018-01-28 DIAGNOSIS — Y832 Surgical operation with anastomosis, bypass or graft as the cause of abnormal reaction of the patient, or of later complication, without mention of misadventure at the time of the procedure: Secondary | ICD-10-CM | POA: Diagnosis not present

## 2018-01-28 DIAGNOSIS — Z888 Allergy status to other drugs, medicaments and biological substances status: Secondary | ICD-10-CM | POA: Insufficient documentation

## 2018-01-28 DIAGNOSIS — G4730 Sleep apnea, unspecified: Secondary | ICD-10-CM | POA: Insufficient documentation

## 2018-01-28 DIAGNOSIS — Z809 Family history of malignant neoplasm, unspecified: Secondary | ICD-10-CM | POA: Insufficient documentation

## 2018-01-28 DIAGNOSIS — E7800 Pure hypercholesterolemia, unspecified: Secondary | ICD-10-CM | POA: Insufficient documentation

## 2018-01-28 DIAGNOSIS — T82868A Thrombosis of vascular prosthetic devices, implants and grafts, initial encounter: Secondary | ICD-10-CM

## 2018-01-28 DIAGNOSIS — Z992 Dependence on renal dialysis: Secondary | ICD-10-CM | POA: Diagnosis not present

## 2018-01-28 DIAGNOSIS — T82858A Stenosis of vascular prosthetic devices, implants and grafts, initial encounter: Secondary | ICD-10-CM | POA: Insufficient documentation

## 2018-01-28 DIAGNOSIS — T829XXA Unspecified complication of cardiac and vascular prosthetic device, implant and graft, initial encounter: Secondary | ICD-10-CM | POA: Insufficient documentation

## 2018-01-28 HISTORY — PX: PERIPHERAL VASCULAR THROMBECTOMY: CATH118306

## 2018-01-28 LAB — GLUCOSE, CAPILLARY: GLUCOSE-CAPILLARY: 86 mg/dL (ref 70–99)

## 2018-01-28 LAB — POTASSIUM (ARMC VASCULAR LAB ONLY): POTASSIUM (ARMC VASCULAR LAB): 4.2 (ref 3.5–5.1)

## 2018-01-28 SURGERY — PERIPHERAL VASCULAR THROMBECTOMY
Anesthesia: Moderate Sedation | Laterality: Left

## 2018-01-28 MED ORDER — FENTANYL CITRATE (PF) 100 MCG/2ML IJ SOLN
INTRAMUSCULAR | Status: DC | PRN
  Administered 2018-01-28: 12.5 ug via INTRAVENOUS
  Administered 2018-01-28 (×2): 25 ug via INTRAVENOUS
  Administered 2018-01-28: 12.5 ug via INTRAVENOUS

## 2018-01-28 MED ORDER — SODIUM CHLORIDE 0.9 % IV SOLN
INTRAVENOUS | Status: DC
  Administered 2018-01-28: 13:00:00 via INTRAVENOUS

## 2018-01-28 MED ORDER — FENTANYL CITRATE (PF) 100 MCG/2ML IJ SOLN
INTRAMUSCULAR | Status: AC
  Filled 2018-01-28: qty 2

## 2018-01-28 MED ORDER — CEFAZOLIN SODIUM-DEXTROSE 2-4 GM/100ML-% IV SOLN
INTRAVENOUS | Status: AC
  Administered 2018-01-28: 2 g via INTRAVENOUS
  Filled 2018-01-28: qty 100

## 2018-01-28 MED ORDER — HEPARIN (PORCINE) IN NACL 1000-0.9 UT/500ML-% IV SOLN
INTRAVENOUS | Status: AC
  Filled 2018-01-28: qty 1000

## 2018-01-28 MED ORDER — HEPARIN SODIUM (PORCINE) 1000 UNIT/ML IJ SOLN
INTRAMUSCULAR | Status: AC
  Filled 2018-01-28: qty 1

## 2018-01-28 MED ORDER — MIDAZOLAM HCL 5 MG/5ML IJ SOLN
INTRAMUSCULAR | Status: AC
  Filled 2018-01-28: qty 5

## 2018-01-28 MED ORDER — HEPARIN SODIUM (PORCINE) 1000 UNIT/ML IJ SOLN
INTRAMUSCULAR | Status: DC | PRN
  Administered 2018-01-28: 3000 [IU] via INTRAVENOUS

## 2018-01-28 MED ORDER — IOPAMIDOL (ISOVUE-300) INJECTION 61%
INTRAVENOUS | Status: DC | PRN
  Administered 2018-01-28: 60 mL via INTRAVENOUS

## 2018-01-28 MED ORDER — MIDAZOLAM HCL 2 MG/2ML IJ SOLN
INTRAMUSCULAR | Status: DC | PRN
  Administered 2018-01-28: 0.5 mg via INTRAVENOUS
  Administered 2018-01-28: 1 mg via INTRAVENOUS
  Administered 2018-01-28: 0.5 mg via INTRAVENOUS
  Administered 2018-01-28: 1 mg via INTRAVENOUS

## 2018-01-28 MED ORDER — CEFAZOLIN SODIUM-DEXTROSE 2-4 GM/100ML-% IV SOLN
2.0000 g | Freq: Once | INTRAVENOUS | Status: AC
  Administered 2018-01-28: 2 g via INTRAVENOUS

## 2018-01-28 SURGICAL SUPPLY — 29 items
BALLN ATG 12X6X80 (BALLOONS) ×3
BALLN DORADO 8X60X80 (BALLOONS) ×3
BALLN LUTONIX AV 10X60X75 (BALLOONS) ×3
BALLOON ATG 12X6X80 (BALLOONS) ×1 IMPLANT
BALLOON DORADO 8X60X80 (BALLOONS) ×1 IMPLANT
BALLOON LUTONIX AV 10X60X75 (BALLOONS) ×1 IMPLANT
CATH BEACON 5 .035 40 KMP TP (CATHETERS) ×1 IMPLANT
CATH BEACON 5 .035 65 KMP TIP (CATHETERS) ×3 IMPLANT
CATH BEACON 5 .038 40 KMP TP (CATHETERS) ×2
COVER PROBE U/S 5X48 (MISCELLANEOUS) ×3 IMPLANT
DEVICE PRESTO INFLATION (MISCELLANEOUS) ×3 IMPLANT
DEVICE TORQUE .025-.038 (MISCELLANEOUS) ×3 IMPLANT
GUIDEWIRE ANGLED .035 180CM (WIRE) ×3 IMPLANT
KIT THROMB PERC PTD (MISCELLANEOUS) ×3 IMPLANT
NEEDLE ENTRY 21GA 7CM ECHOTIP (NEEDLE) ×3 IMPLANT
PACK ANGIOGRAPHY (CUSTOM PROCEDURE TRAY) ×3 IMPLANT
SET INTRO CAPELLA COAXIAL (SET/KITS/TRAYS/PACK) ×3 IMPLANT
SHEATH BRITE TIP 6FRX5.5 (SHEATH) ×6 IMPLANT
SHEATH BRITE TIP 7FRX5.5 (SHEATH) ×3 IMPLANT
STENT COVERA FLARED 8X40X80 (Permanent Stent) ×3 IMPLANT
STENT VENOVO 16X60X80 (Permanent Stent) ×3 IMPLANT
SUT MNCRL 4-0 (SUTURE) ×2
SUT MNCRL 4-0 27XMFL (SUTURE) ×1
SUTURE MNCRL 4-0 27XMF (SUTURE) ×1 IMPLANT
SYR MEDRAD MARK V 150ML (SYRINGE) ×3 IMPLANT
TOWEL OR 17X26 4PK STRL BLUE (TOWEL DISPOSABLE) ×3 IMPLANT
TUBING CONTRAST HIGH PRESS 72 (TUBING) ×3 IMPLANT
WIRE J 3MM .035X145CM (WIRE) ×3 IMPLANT
WIRE MAGIC TORQUE 260C (WIRE) ×3 IMPLANT

## 2018-01-28 NOTE — Op Note (Signed)
OPERATIVE NOTE   PROCEDURE: 1. Contrast injection left brachial axillary AV graft 2. Mechanical thrombectomy with Trerotola device left brachial axillary AV graft 3. Percutaneous transluminal angioplasty and stent placement venous anastomosis peripheral segment left arm AV graft 4. Percutaneous transluminal angioplasty and stent placement left innominate and subclavian veins  PRE-OPERATIVE DIAGNOSIS: Complication of dialysis access with thrombosis                                                       End Stage Renal Disease  POST-OPERATIVE DIAGNOSIS: same as above   SURGEON: Katha Cabal, M.D.  ANESTHESIA: Conscious sedation was administered by the radiology RN under my direct supervision. IV Versed plus fentanyl were utilized. Continuous ECG, pulse oximetry and blood pressure was monitored throughout the entire procedure. Conscious sedation was for a total of 54 minutes.    ESTIMATED BLOOD LOSS: minimal  FINDING(S): 1. Thrombus within the graft central venous system is free of clot however there is a greater than 90% stenosis in the proximal subclavian vein extending into the distal innominate vein on the left.  There is also a string sign greater than 90% noted at the venous outflow of the AV graft at the anastomotic site.  SPECIMEN(S):  None  CONTRAST: 60 cc  FLUOROSCOPY TIME: 7.4 minutes  INDICATIONS: Barbara [name] is a 77 y.o. female who  presents with malfunctioning left brachial axillary AV access.  The patient is scheduled for angiography with possible intervention of the AV access.  The patient is aware the risks include but are not limited to: bleeding, infection, thrombosis of the cannulated access, and possible anaphylactic reaction to the contrast.  The patient acknowledges if the access can not be salvaged a tunneled catheter will be needed and will be placed during this procedure.  The patient is aware of the risks of the procedure and elects to proceed with the  angiogram and intervention.  DESCRIPTION: After full informed written consent was obtained, the patient was brought back to the Special Procedure suite and placed supine position.  Appropriate cardiopulmonary monitors were placed.  The left arm was prepped and draped in the standard fashion.  Appropriate timeout is called. The left brachial axillary AV graft was cannulated with a micropuncture needle using ultrasound guidance.  With the ultrasound the AV access appeared to be filled with heterogeneous material and was poorly compressible indicating thrombosis of the AV access. The puncture was made under direct ultrasound visualization and an image was recorded for the permanent record.  The microwire was advanced and the needle was exchanged for  a microsheath.  The J-wire was then advanced and a 6 Fr sheath inserted.  Hand was then performed which demonstrated thrombus within the AV access.  The central venous structures were also imaged by hand injections.  Based on the images as well as manipulations of the catheter and wire there is a greater than 90% stenosis of the proximal subclavian on the left that extends into the innominate.  Internal jugular appears occluded.  There is a greater than 90% stenosis of the venous outflow at the venous anastomosis this is verified after the first few passes of the Trerotola even after angioplasty.    3000 units of heparin was given and allowed to circulate as well.  And 8 mm x 60 mm Dorado  balloon is then advanced across the venous outflow inflations to 20 atm for 1 minute.  A severe waist is noted at the anastomotic site.  A Trerotola device was then advanced beginning centrally and pulling back performing.  Several passes were made through the venous portion of the graft. Follow-up imaging now demonstrates the vast majority of the clot had been treated. Therefore a retrograde sheath was inserted. This was a 6 [place] sheath and was positioned more proximally on  the arm and angled in the retrograde direction. The left brachial axillary graft was cannulated with a micropuncture needle using ultrasound guidance.  With the ultrasound the AV access appeared to be filled with heterogeneous material and was poorly compressible indicating thrombosis of the AV access. The puncture was made under direct ultrasound visualization and an image was recorded for the permanent record.  Subsequently a floppy Glidewire and a KMP catheter were negotiated into the arterial system hand injection contrast was then utilized to demonstrate patency of the artery as well as the location for the anastomosis. The Trerotola device was now advanced through the retrograde sheath was extended out into the artery the basket was opened and it was slowly pulled back into the graft and then the basket was engaged. Several passes were made on the arterial portion and pulsatility of the access was reestablished. Follow-up imaging demonstrates there was now thrombus in the venous portion surrounding the sheath and this was treated with the Trerotola device from the antegrade direction. After several passes imaging demonstrated resolution of thrombus within the graft and forward flow however persistent stricture of the graft was noted at the venous anastomosis.  There is greater than 60% residual stenosis.  Magic torque wire was then advanced through the antegrade sheath and an 8 mm x 40 mm flared Covera stent is deployed across the venous outflow and then postdilated with the 8 x 6 Dorado balloon was used to angioplasty the venous portion of the AV access.  Single inflation was performed inflation times with 30 seconds with maximum pressures of 20 ATM.  Follow-up imaging demonstrated near 0 residual stenosis.    With the balloon inflated reflux of contrast was performed demonstrating the arterial anastomosis. The arterial anastomosis was widely patent. Contrast was then injected in the forward direction  demonstrating rapid flow.  Attention was then turned to the central lesion.  Magnified imaging demonstrated the stenosis described above.  The 8 mm Dorado balloon was utilized to dilate this.  Subsequently a 16 x 60 V. Novo stent was deployed across the innominate subclavian lesion.  Was then treated with a 10 mm Lutonix balloon inflated to 12 atm for 1 minute.  Following this a 12 mm x 60 mm Atlas balloon was used to post dilate the stent inflation was to 26 atm.  Follow-up imaging demonstrates less than 10% residual stenosis.  A 4-0 Monocryl purse-string suture was sewn around both of the sheaths.  The sheaths were removed and light pressure was applied.  A sterile bandage was applied to the puncture site.  Summary: Successful mechanical thrombectomy of left arm brachial axillary AV graft.  There is angioplasty and stenting of the venous outflow for a 90% stenosis initially now reduced to less than 5%.  There is antroplasty and stenting of the central venous lesion which was greater than 90% now reduced to less than 10%.  COMPLICATIONS: None  CONDITION: Carlynn Purl, M.D [facility] Office: [phone]  01/28/2018 5:26 PM

## 2018-01-28 NOTE — Progress Notes (Signed)
MRN : [id number]  Barbara [name] is a 77 y.o. ([birth year]) female who presents with chief complaint of  Chief Complaint  Patient presents with  . Follow-up    6 month HDA f/u  .  History of Present Illness:   The patient returns to the office for follow up regarding problem with the dialysis access. Currently the patient is maintained via a left arm brachial axillary graft.  The patient has had multiple failed upper extremity accesses.  The patient has also been informed that there has been poor flow.    The patient denies hand pain or other symptoms consistent with steal phenomena.  No significant arm swelling.  The patient denies redness or swelling at the access site. The patient denies fever or chills at home or while on dialysis.  The patient denies amaurosis fugax or recent TIA symptoms. There are no recent neurological changes noted. The patient denies claudication symptoms or rest pain symptoms. The patient denies history of DVT, PE or superficial thrombophlebitis. The patient denies recent episodes of angina or shortness of breath.    Duplex ultrasound shows the graft is occluded  Current Meds  Medication Sig  . ACCU-CHEK AVIVA PLUS test strip 1 each by Other route daily. use as directed  . ACCU-CHEK SOFTCLIX LANCETS lancets INJECT 1 EACH AS DIRECTED ONCE DAILY.  Marland Kitchen acetaminophen (TYLENOL) 500 MG tablet Take 1,000 mg by mouth 2 (two) times daily as needed for moderate pain or headache.   . Albuterol Sulfate (PROAIR RESPICLICK) 885 (90 Base) MCG/ACT AEPB Inhale 2 puffs into the lungs 4 (four) times daily as needed (wheezing).   Marland Kitchen atorvastatin (LIPITOR) 40 MG tablet Take 40 mg by mouth daily.   . Blood Glucose Monitoring Suppl (ACCU-CHEK AVIVA PLUS) w/Device KIT (USE TO MONITOR BLOOD SUGARS.)  . calcium acetate (PHOSLO) 667 MG capsule Take 2 capsules (1,334 mg total) by mouth 3 (three) times daily with meals. (Patient taking differently: Take 2,001 mg by mouth 3 (three) times  daily with meals. May take 2001 mg with each snack)  . cholecalciferol (VITAMIN D) 1000 units tablet Take 1,000 Units by mouth daily.  . cinacalcet (SENSIPAR) 30 MG tablet Take 30 mg by mouth daily.  Marland Kitchen esomeprazole (NEXIUM) 40 MG capsule Take 40 mg by mouth daily after breakfast.   . ferrous sulfate 325 (65 FE) MG EC tablet Take 325 mg by mouth daily after breakfast.   . furosemide (LASIX) 40 MG tablet Take 40 mg by mouth 2 (two) times daily. Take morning dose every day. Lunch dose as needed.  . gabapentin (NEURONTIN) 300 MG capsule Take 600 mg by mouth 3 (three) times daily.  Marland Kitchen lidocaine (XYLOCAINE) 5 % ointment Apply 1 application topically 2 (two) times daily as needed for mild pain.   . NON FORMULARY Place 1 Units into the nose at bedtime. CPAP Time of use 2100-0600  . oxybutynin (DITROPAN) 5 MG tablet Take 5 mg by mouth 3 (three) times daily.  . OXYGEN Inhale 2 L into the lungs at bedtime.  . TRAVATAN Z 0.004 % SOLN ophthalmic solution PLACE 1 DROP INTO BOTH EYES EVERY NIGHT AT BEDTIME    Past Medical History:  Diagnosis Date  . Adult celiac disease   . Anemia associated with chronic renal failure 2017   blood transfusion last week 10/17  . Arthritis   . Asthma   . 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 Chronic kidney disease   . CKD (chronic kidney  disease)    stage IV kidney disease.  dr. singh and dr. lateef follow her  . COPD (chronic obstructive pulmonary disease) (HCC)   . Coronary artery disease   . Diabetes mellitus without complication (HCC)   . Dialysis patient (HCC)    Tues, Thurs, Sat  . Dyspnea    with exertion  . Elevated lipids   . GERD (gastroesophageal reflux disease)   . Hematuria   . Hypertension   . Lower back pain   . Neuropathy   . Oxygen dependent    at hs  . Personal history of chemotherapy 2017   bladder ca  . Personal history of radiation therapy 2017   bladder ca  . Sleep apnea    uses cpap  . Urinary obstruction 01/2016    Past Surgical  History:  Procedure Laterality Date  . ABDOMINAL HYSTERECTOMY    . AV FISTULA PLACEMENT Left 02/17/2017   Procedure: INSERTION OF ARTERIOVENOUS (AV) GORE-TEX GRAFT ARM(BRACHIAL AXILLARY);  Surgeon: ,  G, MD;  Location: [facility];  Service: Vascular;  Laterality: Left;  . CYSTOSCOPY W/ RETROGRADES Bilateral 02/17/2016   Procedure: CYSTOSCOPY WITH RETROGRADE PYELOGRAM;  Surgeon: Ashley Brandon, MD;  Location: [facility];  Service: Urology;  Laterality: Bilateral;  . CYSTOSCOPY W/ RETROGRADES Bilateral 10/12/2016   Procedure: CYSTOSCOPY WITH RETROGRADE PYELOGRAM;  Surgeon: Ashley Brandon, MD;  Location: [facility];  Service: Urology;  Laterality: Bilateral;  . CYSTOSCOPY W/ RETROGRADES Bilateral 06/09/2017   Procedure: CYSTOSCOPY WITH RETROGRADE PYELOGRAM;  Surgeon: Brandon, Ashley, MD;  Location: [facility];  Service: Urology;  Laterality: Bilateral;  . CYSTOSCOPY W/ RETROGRADES Bilateral 10/07/2017   Procedure: CYSTOSCOPY WITH RETROGRADE PYELOGRAM;  Surgeon: Brandon, Ashley, MD;  Location: [facility];  Service: Urology;  Laterality: Bilateral;  . CYSTOSCOPY W/ URETERAL STENT PLACEMENT Left 05/12/2016   Procedure: CYSTOSCOPY WITH STENT REPLACEMENT;  Surgeon: Ashley Brandon, MD;  Location: [facility];  Service: Urology;  Laterality: Left;  . CYSTOSCOPY W/ URETERAL STENT PLACEMENT Left 10/12/2016   Procedure: CYSTOSCOPY WITH STENT REPLACEMENT;  Surgeon: Ashley Brandon, MD;  Location: [facility];  Service: Urology;  Laterality: Left;  . CYSTOSCOPY WITH BIOPSY N/A 06/09/2017   Procedure: CYSTOSCOPY WITH BLADDER BIOPSY;  Surgeon: Brandon, Ashley, MD;  Location: [facility];  Service: Urology;  Laterality: N/A;  . CYSTOSCOPY WITH STENT PLACEMENT Left 01/21/2016   Procedure: CYSTOSCOPY WITH double J STENT PLACEMENT;  Surgeon: Stephen Dahlstedt, MD;  Location: [facility];  Service: Urology;  Laterality: Left;  . CYSTOSCOPY WITH STENT PLACEMENT Right 10/12/2016   Procedure: CYSTOSCOPY WITH STENT PLACEMENT;  Surgeon:  Ashley Brandon, MD;  Location: [facility];  Service: Urology;  Laterality: Right;  . DIALYSIS/PERMA CATHETER INSERTION N/A 11/20/2016   Procedure: Dialysis/Perma Catheter Insertion;  Surgeon: Dew, Jason S, MD;  Location: [facility];  Service: Cardiovascular;  Laterality: N/A;  . DIALYSIS/PERMA CATHETER REMOVAL N/A 03/30/2017   Procedure: DIALYSIS/PERMA CATHETER REMOVAL;  Surgeon: ,  G, MD;  Location: [facility];  Service: Cardiovascular;  Laterality: N/A;  . KIDNEY SURGERY  01/21/2016   IR NEPHROSTOMY PLACEMENT LEFT   . PERIPHERAL VASCULAR CATHETERIZATION N/A 04/07/2016   Procedure: Porta Cath Insertion;  Surgeon:  G , MD;  Location: [facility];  Service: Cardiovascular;  Laterality: N/A;  . PERIPHERAL VASCULAR THROMBECTOMY Left 06/02/2017   Procedure: PERIPHERAL VASCULAR THROMBECTOMY;  Surgeon: Dew, Jason S, MD;  Location: [facility];  Service: Cardiovascular;  Laterality: Left;  . PORTACATH PLACEMENT Right   .   ROTATOR CUFF REPAIR Left   . TRANSURETHRAL RESECTION OF BLADDER TUMOR N/A 02/17/2016   Procedure: TRANSURETHRAL RESECTION OF BLADDER TUMOR (TURBT)-LARGE;  Surgeon: Ashley Brandon, MD;  Location: [facility];  Service: Urology;  Laterality: N/A;  . TRANSURETHRAL RESECTION OF BLADDER TUMOR N/A 10/07/2017   Procedure: TRANSURETHRAL RESECTION OF BLADDER TUMOR (TURBT)-small;  Surgeon: Brandon, Ashley, MD;  Location: [facility];  Service: Urology;  Laterality: N/A;  . URETEROSCOPY Left 02/17/2016   Procedure: URETEROSCOPY;  Surgeon: Ashley Brandon, MD;  Location: [facility];  Service: Urology;  Laterality: Left;  . URETEROSCOPY Right 10/12/2016   Procedure: URETEROSCOPY;  Surgeon: Ashley Brandon, MD;  Location: [facility];  Service: Urology;  Laterality: Right;    Social History Social History   Tobacco Use  . Smoking status: Former Smoker    Types: Cigarettes    Last attempt to quit: 07/20/2002    Years since quitting: 15.5  . Smokeless  tobacco: Never Used  . Tobacco comment: 01/22/2016   "  quit smoking many years ago "  Substance Use Topics  . Alcohol use: No  . Drug use: No    Family History Family History  Problem Relation Age of Onset  . Diabetes Mother   . Cancer Father   . Breast cancer Neg Hx   . Kidney disease Neg Hx     Allergies  Allergen Reactions  . Glipizide Er     Dropped blood sugar too much  . Percocet [Oxycodone-Acetaminophen] Hives     REVIEW OF SYSTEMS (Negative unless checked)  Constitutional: []Weight loss  []Fever  []Chills Cardiac: []Chest pain   []Chest pressure   []Palpitations   []Shortness of breath when laying flat   []Shortness of breath with exertion. Vascular:  []Pain in legs with walking   []Pain in legs at rest  []History of DVT   []Phlebitis   []Swelling in legs   []Varicose veins   []Non-healing ulcers Pulmonary:   []Uses home oxygen   []Productive cough   []Hemoptysis   []Wheeze  []COPD   []Asthma Neurologic:  []Dizziness   []Seizures   []History of stroke   []History of TIA  []Aphasia   []Vissual changes   []Weakness or numbness in arm   []Weakness or numbness in leg Musculoskeletal:   []Joint swelling   []Joint pain   []Low back pain Hematologic:  []Easy bruising  []Easy bleeding   []Hypercoagulable state   []Anemic Gastrointestinal:  []Diarrhea   []Vomiting  []Gastroesophageal reflux/heartburn   []Difficulty swallowing. Genitourinary:  [x]Chronic kidney disease   []Difficult urination  []Frequent urination   []Blood in urine Skin:  []Rashes   []Ulcers  Psychological:  []History of anxiety   [] History of major depression.  Physical Examination  Vitals:   01/27/18 1536  BP: (!) 115/52  Pulse: 77  Resp: 16  Weight: 184 lb (83.5 kg)  Height: 5' 6" (1.676 m)   Body mass index is 29.7 kg/m. Gen: WD/WN, NAD Head: [place]/AT, No temporalis wasting.  Ear/Nose/Throat: Hearing grossly intact, nares w/o erythema or drainage Eyes: PER, EOMI, sclera nonicteric.  Neck:  Supple, no large masses.   Pulmonary:  Good air movement, no audible wheezing bilaterally, no use of accessory muscles.  Cardiac: RRR, no JVD Vascular: left brach ax graft no thrill no bruit Vessel Right Left  Radial Palpable Palpable  Ulnar Palpable Palpable  Brachial Palpable Palpable  Gastrointestinal: Non-distended. No guarding/no peritoneal signs.  Musculoskeletal: M/S 5/5 throughout.  No deformity or atrophy.  Neurologic: CN 2-12   intact. Symmetrical.  Speech is fluent. Motor exam as listed above. Psychiatric: Judgment intact, Mood & affect appropriate for pt's clinical situation. Dermatologic: No rashes or ulcers noted.  No changes consistent with cellulitis. Lymph : No lichenification or skin changes of chronic lymphedema.  CBC Lab Results  Component Value Date   WBC 7.5 09/03/2017   HGB 13.3 10/07/2017   HCT 39.0 10/07/2017   MCV 96.4 09/03/2017   PLT 129 (L) 09/03/2017    BMET    Component Value Date/Time   NA 141 10/07/2017 0643   NA 138 11/04/2016 1453   NA 138 08/07/2012 0433   K 3.7 10/07/2017 0643   K 3.2 (L) 10/30/2014 1410   CL 102 09/03/2017 1140   CL 104 08/07/2012 0433   CO2 23 09/03/2017 1140   CO2 25 08/07/2012 0433   GLUCOSE 117 (H) 10/07/2017 0643   GLUCOSE 172 (H) 08/07/2012 0433   BUN 35 (H) 09/03/2017 1140   BUN 69 (H) 11/04/2016 1453   BUN 63 (H) 08/07/2012 0433   CREATININE 6.01 (H) 09/03/2017 1140   CREATININE 2.48 (H) 08/07/2012 0433   CALCIUM 8.3 (L) 09/03/2017 1140   CALCIUM 9.1 08/07/2012 0433   GFRNONAA 6 (L) 09/03/2017 1140   GFRNONAA 19 (L) 08/07/2012 0433   GFRAA 7 (L) 09/03/2017 1140   GFRAA 22 (L) 08/07/2012 0433   CrCl cannot be calculated (Patient's most recent lab result is older than the maximum 21 days allowed.).  COAG Lab Results  Component Value Date   INR 1.08 02/08/2017   INR 1.23 01/25/2016   INR 1.38 01/24/2016    Radiology Ct Abdomen Pelvis Wo Contrast  Result Date: 01/19/2018 CLINICAL DATA:  Bladder  cancer. EXAM: CT CHEST, ABDOMEN AND PELVIS WITHOUT CONTRAST TECHNIQUE: Multidetector CT imaging of the chest, abdomen and pelvis was performed following the standard protocol without IV contrast. COMPARISON:  PET-CT 01/01/2017 and CT scan 09/03/2017 FINDINGS: CT CHEST FINDINGS Cardiovascular: The heart is normal in size. No pericardial effusion. Stable tortuosity and calcification of the thoracic aorta and stable three-vessel coronary artery calcifications. Mediastinum/Nodes: Small scattered sub 8 mm mediastinal and hilar lymph nodes. No mass or overt adenopathy. The esophagus is grossly normal. Lungs/Pleura: No acute pulmonary findings or worrisome pulmonary nodules to suggest pulmonary metastatic disease. Mild eventration of both hemidiaphragms with some overlying atelectasis. Mild-to-moderate bilateral lower lobe bronchiectasis. Musculoskeletal: No breast masses, supraclavicular or axillary adenopathy. The right-sided Port-A-Cath is stable. No significant bony findings. Stable advanced degenerative changes in the mid upper thoracic spine. CT ABDOMEN PELVIS FINDINGS Hepatobiliary: No focal hepatic lesions or intrahepatic biliary dilatation. The gallbladder is normal. No common bile duct dilatation. Pancreas: No mass, inflammation or ductal dilatation. Spleen: Normal size.  No focal lesions. Adrenals/Urinary Tract: Stable right adrenal gland nodule consistent with benign adenoma. Severe left and mild right hydroureteronephrosis down to the bladder. No obstructing renal calculi. Diffusely markedly thickened bladder wall. Stomach/Bowel: Grossly normal. Vascular/Lymphatic: Small scattered mesenteric and retroperitoneal lymph nodes are stable. Right-sided common iliac lymph node on image number 79 measures 15.5 mm and previously measured 17 mm. No pelvic lymphadenopathy. Right femoral lymph node measures 7.5 mm and is unchanged. Scattered inguinal lymph nodes are stable. Reproductive: The uterus is surgically absent. I  believe both ovaries are still present and appear normal. Other: No inguinal mass or adenopathy. Musculoskeletal: No findings suspicious for osseous metastatic disease. Stable degenerative changes involving both hips with subchondral cystic changes on the left. IMPRESSION: 1. Diffusely and markedly thickened bladder wall.  2. Significant left and mild right-sided hydroureteronephrosis. 3. Slight interval decrease in size of the right common iliac lymph node. Small right common femoral and scattered inguinal lymph nodes are stable. 4. No findings for metastatic disease involving the chest or osseous structures. Electronically Signed   By: Marijo Sanes M.D.   On: 01/19/2018 20:22   Ct Chest Wo Contrast  Result Date: 01/19/2018 CLINICAL DATA:  Bladder cancer. EXAM: CT CHEST, ABDOMEN AND PELVIS WITHOUT CONTRAST TECHNIQUE: Multidetector CT imaging of the chest, abdomen and pelvis was performed following the standard protocol without IV contrast. COMPARISON:  PET-CT 01/01/2017 and CT scan 09/03/2017 FINDINGS: CT CHEST FINDINGS Cardiovascular: The heart is normal in size. No pericardial effusion. Stable tortuosity and calcification of the thoracic aorta and stable three-vessel coronary artery calcifications. Mediastinum/Nodes: Small scattered sub 8 mm mediastinal and hilar lymph nodes. No mass or overt adenopathy. The esophagus is grossly normal. Lungs/Pleura: No acute pulmonary findings or worrisome pulmonary nodules to suggest pulmonary metastatic disease. Mild eventration of both hemidiaphragms with some overlying atelectasis. Mild-to-moderate bilateral lower lobe bronchiectasis. Musculoskeletal: No breast masses, supraclavicular or axillary adenopathy. The right-sided Port-A-Cath is stable. No significant bony findings. Stable advanced degenerative changes in the mid upper thoracic spine. CT ABDOMEN PELVIS FINDINGS Hepatobiliary: No focal hepatic lesions or intrahepatic biliary dilatation. The gallbladder is normal.  No common bile duct dilatation. Pancreas: No mass, inflammation or ductal dilatation. Spleen: Normal size.  No focal lesions. Adrenals/Urinary Tract: Stable right adrenal gland nodule consistent with benign adenoma. Severe left and mild right hydroureteronephrosis down to the bladder. No obstructing renal calculi. Diffusely markedly thickened bladder wall. Stomach/Bowel: Grossly normal. Vascular/Lymphatic: Small scattered mesenteric and retroperitoneal lymph nodes are stable. Right-sided common iliac lymph node on image number 79 measures 15.5 mm and previously measured 17 mm. No pelvic lymphadenopathy. Right femoral lymph node measures 7.5 mm and is unchanged. Scattered inguinal lymph nodes are stable. Reproductive: The uterus is surgically absent. I believe both ovaries are still present and appear normal. Other: No inguinal mass or adenopathy. Musculoskeletal: No findings suspicious for osseous metastatic disease. Stable degenerative changes involving both hips with subchondral cystic changes on the left. IMPRESSION: 1. Diffusely and markedly thickened bladder wall. 2. Significant left and mild right-sided hydroureteronephrosis. 3. Slight interval decrease in size of the right common iliac lymph node. Small right common femoral and scattered inguinal lymph nodes are stable. 4. No findings for metastatic disease involving the chest or osseous structures. Electronically Signed   By: Marijo Sanes M.D.   On: 01/19/2018 20:22      Assessment/Plan 1. Complication from renal dialysis device, sequela Recommend:  The patient has a clotted  dialysis access.  I attempted to get her on the schedule for tomorrow [facility] and was told to schedule his full and there is no bruit.  I therefore, will schedule her to be declotted at [facility].  Patient should have a fistulagram with the intention for intervention.  The intention for intervention is to restore appropriate flow and prevent thrombosis and possible loss  of the access.  As well as improve the quality of dialysis therapy.  The risks, benefits and alternative therapies were reviewed in detail with the patient.  All questions were answered.  The patient agrees to proceed with angio/intervention.      2. End stage renal disease ([place]) Continue dialysis without interruption once thrombectomy has been performed  3. Type 2 diabetes mellitus with complication, unspecified whether long term insulin use (HCC) Continue hypoglycemic medications  as already ordered, these medications have been reviewed and there are no changes at this time.  Hgb A1C to be monitored as already arranged by primary service   4. Essential hypertension Continue antihypertensive medications as already ordered, these medications have been reviewed and there are no changes at this time.   5. CAD in native artery Continue cardiac and antihypertensive medications as already ordered and reviewed, no changes at this time.  Continue statin as ordered and reviewed, no changes at this time  Nitrates PRN for chest pain     , MD  01/28/2018 12:04 PM 

## 2018-01-28 NOTE — Discharge Instructions (Signed)

## 2018-01-30 ENCOUNTER — Encounter: Payer: Self-pay | Admitting: *Deleted

## 2018-01-30 ENCOUNTER — Emergency Department
Admission: EM | Admit: 2018-01-30 | Discharge: 2018-01-31 | Disposition: A | Discharge status: Home / Self Care | Payer: Medicare Other | Attending: Emergency Medicine | Admitting: Emergency Medicine

## 2018-01-30 ENCOUNTER — Other Ambulatory Visit: Payer: Self-pay

## 2018-01-30 DIAGNOSIS — I2510 Atherosclerotic heart disease of native coronary artery without angina pectoris: Secondary | ICD-10-CM | POA: Diagnosis not present

## 2018-01-30 DIAGNOSIS — N186 End stage renal disease: Secondary | ICD-10-CM | POA: Insufficient documentation

## 2018-01-30 DIAGNOSIS — E1122 Type 2 diabetes mellitus with diabetic chronic kidney disease: Secondary | ICD-10-CM | POA: Diagnosis not present

## 2018-01-30 DIAGNOSIS — R5383 Other fatigue: Secondary | ICD-10-CM | POA: Diagnosis present

## 2018-01-30 DIAGNOSIS — Z8551 Personal history of malignant neoplasm of bladder: Secondary | ICD-10-CM | POA: Diagnosis not present

## 2018-01-30 DIAGNOSIS — Z923 Personal history of irradiation: Secondary | ICD-10-CM | POA: Insufficient documentation

## 2018-01-30 DIAGNOSIS — Z992 Dependence on renal dialysis: Secondary | ICD-10-CM | POA: Insufficient documentation

## 2018-01-30 DIAGNOSIS — N390 Urinary tract infection, site not specified: Secondary | ICD-10-CM | POA: Insufficient documentation

## 2018-01-30 DIAGNOSIS — Z9981 Dependence on supplemental oxygen: Secondary | ICD-10-CM | POA: Diagnosis not present

## 2018-01-30 DIAGNOSIS — J449 Chronic obstructive pulmonary disease, unspecified: Secondary | ICD-10-CM | POA: Diagnosis not present

## 2018-01-30 DIAGNOSIS — R309 Painful micturition, unspecified: Secondary | ICD-10-CM | POA: Diagnosis not present

## 2018-01-30 DIAGNOSIS — J45909 Unspecified asthma, uncomplicated: Secondary | ICD-10-CM | POA: Diagnosis not present

## 2018-01-30 DIAGNOSIS — E1140 Type 2 diabetes mellitus with diabetic neuropathy, unspecified: Secondary | ICD-10-CM | POA: Diagnosis not present

## 2018-01-30 DIAGNOSIS — R531 Weakness: Secondary | ICD-10-CM | POA: Insufficient documentation

## 2018-01-30 DIAGNOSIS — Z9221 Personal history of antineoplastic chemotherapy: Secondary | ICD-10-CM | POA: Diagnosis not present

## 2018-01-30 DIAGNOSIS — Z87891 Personal history of nicotine dependence: Secondary | ICD-10-CM | POA: Insufficient documentation

## 2018-01-30 DIAGNOSIS — I120 Hypertensive chronic kidney disease with stage 5 chronic kidney disease or end stage renal disease: Secondary | ICD-10-CM | POA: Insufficient documentation

## 2018-01-30 DIAGNOSIS — Z79899 Other long term (current) drug therapy: Secondary | ICD-10-CM | POA: Insufficient documentation

## 2018-01-30 LAB — URINALYSIS, COMPLETE (UACMP) WITH MICROSCOPIC
Bilirubin Urine: NEGATIVE
Glucose, UA: NEGATIVE mg/dL
KETONES UR: NEGATIVE mg/dL
Nitrite: NEGATIVE
PH: 7 (ref 5.0–8.0)
Protein, ur: 100 mg/dL — AB
SPECIFIC GRAVITY, URINE: 1.015 (ref 1.005–1.030)

## 2018-01-30 LAB — CBC
HEMATOCRIT: 30.3 % — AB (ref 35.0–47.0)
Hemoglobin: 10.3 g/dL — ABNORMAL LOW (ref 12.0–16.0)
MCH: 34 pg (ref 26.0–34.0)
MCHC: 34 g/dL (ref 32.0–36.0)
MCV: 100.1 fL — AB (ref 80.0–100.0)
Platelets: 158 10*3/uL (ref 150–440)
RBC: 3.02 MIL/uL — AB (ref 3.80–5.20)
RDW: 15.3 % — ABNORMAL HIGH (ref 11.5–14.5)
WBC: 4.9 10*3/uL (ref 3.6–11.0)

## 2018-01-30 LAB — BASIC METABOLIC PANEL
Anion gap: 12 (ref 5–15)
BUN: 44 mg/dL — ABNORMAL HIGH (ref 8–23)
CHLORIDE: 104 mmol/L (ref 98–111)
CO2: 23 mmol/L (ref 22–32)
Calcium: 7.7 mg/dL — ABNORMAL LOW (ref 8.9–10.3)
Creatinine, Ser: 10.06 mg/dL — ABNORMAL HIGH (ref 0.44–1.00)
GFR calc non Af Amer: 3 mL/min — ABNORMAL LOW (ref >60)
GFR, EST AFRICAN AMERICAN: 4 mL/min — AB (ref >60)
Glucose, Bld: 125 mg/dL — ABNORMAL HIGH (ref 70–99)
POTASSIUM: 3.5 mmol/L (ref 3.5–5.1)
SODIUM: 139 mmol/L (ref 135–145)

## 2018-01-30 LAB — TROPONIN I

## 2018-01-30 MED ORDER — AMOXICILLIN-POT CLAVULANATE 875-125 MG PO TABS
ORAL_TABLET | ORAL | Status: AC
  Filled 2018-01-30: qty 1

## 2018-01-30 MED ORDER — SODIUM CHLORIDE 0.9 % IV BOLUS
250.0000 mL | Freq: Once | INTRAVENOUS | Status: AC
  Administered 2018-01-30: 250 mL via INTRAVENOUS

## 2018-01-30 MED ORDER — AMOXICILLIN-POT CLAVULANATE 500-125 MG PO TABS
1.0000 | ORAL_TABLET | ORAL | Status: AC
  Administered 2018-01-30: 500 mg via ORAL
  Filled 2018-01-30: qty 1

## 2018-01-30 MED ORDER — HEPARIN SOD (PORK) LOCK FLUSH 100 UNIT/ML IV SOLN
INTRAVENOUS | Status: AC
  Administered 2018-01-30: 500 [IU] via INTRAVENOUS
  Filled 2018-01-30: qty 5

## 2018-01-30 MED ORDER — AMOXICILLIN-POT CLAVULANATE 500-125 MG PO TABS: 1.0000 | ORAL_TABLET | Freq: Every day | ORAL | 0 refills | Status: AC

## 2018-01-30 MED ORDER — HEPARIN SOD (PORK) LOCK FLUSH 100 UNIT/ML IV SOLN
500.0000 [IU] | Freq: Once | INTRAVENOUS | Status: AC
  Administered 2018-01-30: 500 [IU] via INTRAVENOUS

## 2018-01-30 NOTE — ED Notes (Signed)
Discharge instructions reviewed with patient. Questions fielded by this RN. Patient verbalizes understanding of instructions. Patient discharged home in stable condition per [facility]. No acute distress noted at time of discharge.   No peripheral IV placed this visit.  Port DC'd.

## 2018-01-30 NOTE — ED Notes (Signed)
MD at bedside with patient and patients family.   RN accessed right sided port. Blood returned and NS flushed without difficulty. No swelling or bruising around the insertion site.

## 2018-01-30 NOTE — ED Provider Notes (Signed)
[facility] Provider Note   ____________________________________________   First MD Initiated Contact with Patient 01/30/18 2013     (approximate)  I have reviewed the triage vital signs and the nursing notes.   HISTORY  Chief Complaint Weakness    HPI Barbara [name] is a 77 y.o. female history of end-stage renal disease, chronic kidney disease, on dialysis.  Patient reports for 2 days now she is noticed burning when she urinates, she will only urinate about once or twice a day and small amounts she is on dialysis, but notes when it does she is feeling a burning sensation with urination.  No other pain.  No nausea or vomiting.  No fevers or chills.  She just feels more tired than usual after her dialysis, but reports she can still get up and walk and eat.  No chest pain or trouble breathing.  No headaches.  No weakness in the face, trouble speaking, or weakness in the arms or legs.  She just reports that she is feels generally fatigued tired more so than usual.  Had dialysis yesterday, plans to have her next dialysis tomorrow.  Past Medical History:  Diagnosis Date  . Adult celiac disease   . Anemia associated with chronic renal failure 2017   blood transfusion last week 10/17  . Arthritis   . Asthma   . Cancer ([place]) 2017   bladder  . Chronic kidney disease   . 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  dr. Candiss Norse and dr. Holley Raring follow her  . COPD (chronic obstructive pulmonary disease) ([place])   . Coronary artery disease   . Diabetes mellitus without complication ([place])   . Dialysis patient ([place])    Tues, Thurs, Sat  . Dyspnea    with exertion  . Elevated lipids   . GERD (gastroesophageal reflux disease)   . Hematuria   . Hypertension   . Lower back pain   . Neuropathy   . Oxygen dependent    at hs  . Personal history of chemotherapy 2017   bladder ca  . Personal history of radiation therapy 2017   bladder ca  . Sleep apnea    uses cpap  . Urinary obstruction 01/2016    Patient Active Problem List   Diagnosis Date Noted  . Complication from renal dialysis device 01/28/2018  . Presence of cardiac and vascular implant and graft   . Fever   . Sepsis ([place])   . End stage renal disease ([place]) 02/01/2017  . Sepsis secondary to UTI ([place]) 01/24/2017  . Acute kidney injury ([place]) 11/19/2016  . Anemia, chronic renal failure, stage 4 (severe) ([place]) 09/17/2016  . Hypoglycemia 06/04/2016  . Hyperkalemia 05/12/2016  . Protein-calorie malnutrition, severe 05/05/2016  . Acute on chronic renal failure ([place]) 05/04/2016  . Seizure ([place]) 04/17/2016  . Palliative care by specialist   . DNR (do not resuscitate) discussion   . C. difficile diarrhea 03/11/2016  . Anemia 03/11/2016  . Hypotension 03/11/2016  . Acidosis 03/11/2016  . Failure to thrive (child) 03/11/2016  . Weakness generalized 03/11/2016  . ARF (acute renal failure) ([place]) 03/04/2016  . 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 Absolute anemia 02/04/2016  . Airway hyperreactivity 02/04/2016  . Celiac disease 02/04/2016  . Gastric catarrh 02/04/2016  . Acid reflux 02/04/2016  . Combined fat and carbohydrate induced hyperlipemia 02/04/2016  . C. difficile colitis 01/22/2016  . Urinary obstruction 01/21/2016  .  COPD (chronic obstructive pulmonary disease) ([place]) 01/21/2016  . Controlled type 2 diabetes mellitus with stage 4 chronic kidney disease, with long-term current use of insulin ([place]) 01/21/2016  . Essential hypertension 01/21/2016  . Acute renal failure superimposed on stage 4 chronic kidney disease ([place]) 01/20/2016  . Anemia in chronic kidney disease 01/20/2016  . Arthritis of knee, degenerative 05/10/2015  . Knee strain 03/26/2015  . Other intervertebral disc displacement, lumbar region 03/04/2015  . Degenerative arthritis of lumbar spine 03/04/2015  . HNP (herniated nucleus pulposus), lumbar 03/04/2015  .  Osteoarthritis of spine with radiculopathy, lumbar region 03/04/2015  . Injury of tendon of upper extremity 11/12/2014  . Atherosclerosis of abdominal aorta ([place]) 11/02/2014  . Chronic kidney disease, stage IV (severe) ([place]) 11/02/2014  . Obstructive apnea 11/02/2014  . Complete rotator cuff rupture of left shoulder 10/05/2014  . Arthritis of shoulder region, degenerative 10/05/2014  . CAD in native artery 12/08/2013  . Benign essential HTN 12/08/2013  . Type 2 diabetes mellitus ([place]) 12/08/2013    Past Surgical History:  Procedure Laterality Date  . ABDOMINAL HYSTERECTOMY    . AV FISTULA PLACEMENT Left 02/17/2017   Procedure: INSERTION OF ARTERIOVENOUS (AV) GORE-TEX GRAFT ARM(BRACHIAL AXILLARY);  Surgeon: Katha Cabal, MD;  Location: [facility];  Service: Vascular;  Laterality: Left;  . CYSTOSCOPY W/ RETROGRADES Bilateral 02/17/2016   Procedure: CYSTOSCOPY WITH RETROGRADE PYELOGRAM;  Surgeon: Hollice Espy, MD;  Location: [facility];  Service: Urology;  Laterality: Bilateral;  . CYSTOSCOPY W/ RETROGRADES Bilateral 10/12/2016   Procedure: CYSTOSCOPY WITH RETROGRADE PYELOGRAM;  Surgeon: Hollice Espy, MD;  Location: [facility];  Service: Urology;  Laterality: Bilateral;  . CYSTOSCOPY W/ RETROGRADES Bilateral 06/09/2017   Procedure: CYSTOSCOPY WITH RETROGRADE PYELOGRAM;  Surgeon: Hollice Espy, MD;  Location: [facility];  Service: Urology;  Laterality: Bilateral;  . CYSTOSCOPY W/ RETROGRADES Bilateral 10/07/2017   Procedure: CYSTOSCOPY WITH RETROGRADE PYELOGRAM;  Surgeon: Hollice Espy, MD;  Location: [facility];  Service: Urology;  Laterality: Bilateral;  . CYSTOSCOPY W/ URETERAL STENT PLACEMENT Left 05/12/2016   Procedure: CYSTOSCOPY WITH STENT REPLACEMENT;  Surgeon: Hollice Espy, MD;  Location: [facility];  Service: Urology;  Laterality: Left;  . CYSTOSCOPY W/ URETERAL STENT PLACEMENT Left 10/12/2016   Procedure: CYSTOSCOPY WITH STENT REPLACEMENT;  Surgeon: Hollice Espy, MD;  Location:  [facility];  Service: Urology;  Laterality: Left;  . CYSTOSCOPY WITH BIOPSY N/A 06/09/2017   Procedure: CYSTOSCOPY WITH BLADDER BIOPSY;  Surgeon: Hollice Espy, MD;  Location: [facility];  Service: Urology;  Laterality: N/A;  . CYSTOSCOPY WITH STENT PLACEMENT Left 01/21/2016   Procedure: CYSTOSCOPY WITH double J STENT PLACEMENT;  Surgeon: Franchot Gallo, MD;  Location: [facility];  Service: Urology;  Laterality: Left;  . CYSTOSCOPY WITH STENT PLACEMENT Right 10/12/2016   Procedure: CYSTOSCOPY WITH STENT PLACEMENT;  Surgeon: Hollice Espy, MD;  Location: [facility];  Service: Urology;  Laterality: Right;  . DIALYSIS/PERMA CATHETER INSERTION N/A 11/20/2016   Procedure: Dialysis/Perma Catheter Insertion;  Surgeon: Algernon Huxley, MD;  Location: [facility];  Service: Cardiovascular;  Laterality: N/A;  . DIALYSIS/PERMA CATHETER REMOVAL N/A 03/30/2017   Procedure: DIALYSIS/PERMA CATHETER REMOVAL;  Surgeon: Katha Cabal, MD;  Location: [facility];  Service: Cardiovascular;  Laterality: N/A;  . KIDNEY SURGERY  01/21/2016   IR NEPHROSTOMY PLACEMENT LEFT   . PERIPHERAL VASCULAR CATHETERIZATION N/A 04/07/2016   Procedure: Glori Luis Cath Insertion;  Surgeon: Katha Cabal, MD;  Location: [facility];  Service: Cardiovascular;  Laterality: N/A;  .  PERIPHERAL VASCULAR THROMBECTOMY Left 06/02/2017   Procedure: PERIPHERAL VASCULAR THROMBECTOMY;  Surgeon: Algernon Huxley, MD;  Location: [facility];  Service: Cardiovascular;  Laterality: Left;  . PORTACATH PLACEMENT Right   . ROTATOR CUFF REPAIR Left   . TRANSURETHRAL RESECTION OF BLADDER TUMOR N/A 02/17/2016   Procedure: TRANSURETHRAL RESECTION OF BLADDER TUMOR (TURBT)-LARGE;  Surgeon: Hollice Espy, MD;  Location: [facility];  Service: Urology;  Laterality: N/A;  . TRANSURETHRAL RESECTION OF BLADDER TUMOR N/A 10/07/2017   Procedure: TRANSURETHRAL RESECTION OF BLADDER TUMOR (TURBT)-small;  Surgeon: Hollice Espy, MD;  Location:  [facility];  Service: Urology;  Laterality: N/A;  . URETEROSCOPY Left 02/17/2016   Procedure: URETEROSCOPY;  Surgeon: Hollice Espy, MD;  Location: [facility];  Service: Urology;  Laterality: Left;  . URETEROSCOPY Right 10/12/2016   Procedure: URETEROSCOPY;  Surgeon: Hollice Espy, MD;  Location: [facility];  Service: Urology;  Laterality: Right;    Prior to Admission medications   Medication Sig Start Date End Date Taking? Authorizing Provider  acetaminophen (TYLENOL) 500 MG tablet Take 1,000 mg by mouth 2 (two) times daily as needed for moderate pain or headache.    Yes [provider]  Albuterol Sulfate (PROAIR RESPICLICK) 353 (90 Base) MCG/ACT AEPB Inhale 2 puffs into the lungs 4 (four) times daily as needed (wheezing).    Yes [provider]  atorvastatin (LIPITOR) 40 MG tablet Take 40 mg by mouth daily.  11/01/16  Yes [provider]  cholecalciferol (VITAMIN D) 1000 units tablet Take 1,000 Units by mouth daily.   Yes [provider]  esomeprazole (NEXIUM) 40 MG capsule Take 40 mg by mouth daily after breakfast.    Yes [provider]  ferrous sulfate 325 (65 FE) MG EC tablet Take 325 mg by mouth daily after breakfast.    Yes [provider]  furosemide (LASIX) 40 MG tablet Take 40 mg by mouth 2 (two) times daily. Take morning dose every day. Lunch dose as needed.   Yes [provider]  gabapentin (NEURONTIN) 300 MG capsule Take 600 mg by mouth 3 (three) times daily.   Yes [provider]  oxybutynin (DITROPAN) 5 MG tablet Take 5 mg by mouth 3 (three) times daily.   Yes [provider]  ACCU-CHEK AVIVA PLUS test strip 1 each by Other route daily. use as directed 01/26/17   [provider]  ACCU-CHEK SOFTCLIX LANCETS lancets INJECT 1 EACH AS DIRECTED ONCE DAILY. 05/28/17   [provider]  amoxicillin-clavulanate (AUGMENTIN) 500-125 MG tablet Take 1 tablet (500 mg total) by mouth daily for 5 days. On  the days that you have dialysis, take your medication after your hemodialysis session 01/30/18 02/04/18  Delman Kitten, MD  Blood Glucose Monitoring Suppl (ACCU-CHEK AVIVA PLUS) w/Device KIT (USE TO MONITOR BLOOD SUGARS.) 12/30/17   [provider]  calcium acetate (PHOSLO) 667 MG capsule Take 2 capsules (1,334 mg total) by mouth 3 (three) times daily with meals. Patient not taking: Reported on 01/28/2018 11/23/16   Fritzi Mandes, MD  lidocaine (XYLOCAINE) 5 % ointment Apply 1 application topically 2 (two) times daily as needed for mild pain.  05/21/16   [provider]  NON FORMULARY Place 1 Units into the nose at bedtime. CPAP Time of use 2100-0600    [provider]  OXYGEN Inhale 2 L into the lungs at bedtime.    [provider]    Allergies Glipizide er and Percocet [oxycodone-acetaminophen]  Family History  Problem Relation  Age of Onset  . Diabetes Mother   . Cancer Father   . Breast cancer Neg Hx   . Kidney disease Neg Hx     Social History Social History   Tobacco Use  . Smoking status: Former Smoker    Types: Cigarettes    Last attempt to quit: 07/20/2002    Years since quitting: 15.5  . Smokeless tobacco: Never Used  . Tobacco comment: 01/22/2016   "  quit smoking many years ago "  Substance Use Topics  . Alcohol use: No  . Drug use: No    Review of Systems Constitutional: No fever/chills feels just generally weak Eyes: No visual changes. ENT: No sore throat. Cardiovascular: Denies chest pain. Respiratory: Denies shortness of breath. Gastrointestinal: No abdominal pain.  No nausea, no vomiting.  No diarrhea.  No constipation. Genitourinary: Burning with urination Musculoskeletal: Negative for back pain. Skin: Negative for rash. Neurological: Negative for headaches, focal weakness or numbness.    ____________________________________________   PHYSICAL EXAM:  VITAL SIGNS: ED Triage Vitals  Enc Vitals Group     BP 01/30/18 1940  (!) 108/52     Pulse Rate 01/30/18 1940 75     Resp 01/30/18 1940 18     Temp 01/30/18 1940 98.2 F (36.8 C)     Temp Source 01/30/18 1940 Oral     SpO2 01/30/18 1940 93 %     Weight 01/30/18 1942 184 lb (83.5 kg)     Height 01/30/18 1942 _0  (1.676 m)     Head Circumference --      Peak Flow --      Pain Score 01/30/18 1942 8     Pain Loc --      Pain Edu? --      Excl. in [place]? --     Constitutional: Alert and oriented. Well appearing and in no acute distress.  She and her husband are both very pleasant.  In no distress. Eyes: Conjunctivae are normal. Head: Atraumatic. Nose: No congestion/rhinnorhea. Mouth/Throat: Mucous membranes are moist. Neck: No stridor.   Cardiovascular: Normal rate, regular rhythm. Grossly normal heart sounds.  Good peripheral circulation.  Palpable thrill in left upper extremity, no surrounding erythema.  Port over the right upper chest wall, no tenderness erythema or purulence. Respiratory: Normal respiratory effort.  No retractions. Lungs CTAB.  No cough.  Speaks in full clear sentences. Gastrointestinal: Soft and nontender. No distention. Musculoskeletal: No lower extremity tenderness nor edema. Neurologic:  Normal speech and language. No gross focal neurologic deficits are appreciated.  Skin:  Skin is warm, dry and intact. No rash noted. Psychiatric: Mood and affect are normal. Speech and behavior are normal.  ____________________________________________   LABS (all labs ordered are listed, but only abnormal results are displayed)  Labs Reviewed  BASIC METABOLIC PANEL - Abnormal; Notable for the following components:      Result Value   Glucose, Bld 125 (*)    BUN 44 (*)    Creatinine, Ser 10.06 (*)    Calcium 7.7 (*)    GFR calc non Af Amer 3 (*)    GFR calc Af Amer 4 (*)    All other components within normal limits  CBC - Abnormal; Notable for the following components:   RBC 3.02 (*)    Hemoglobin 10.3 (*)    HCT 30.3 (*)    MCV 100.1  (*)    RDW 15.3 (*)    All other components within normal limits  URINALYSIS,  COMPLETE (UACMP) WITH MICROSCOPIC - Abnormal; Notable for the following components:   Color, Urine YELLOW (*)    APPearance CLOUDY (*)    Hgb urine dipstick LARGE (*)    Protein, ur 100 (*)    Leukocytes, UA MODERATE (*)    RBC / HPF >50 (*)    WBC, UA >50 (*)    Bacteria, UA FEW (*)    All other components within normal limits  URINE CULTURE  TROPONIN I   ____________________________________________  EKG  Reviewed and interpreted by me at [place] rate 75 QRS 90 QTc 430 Normal sinus rhythm, no evidence of acute ischemia ____________________________________________  RADIOLOGY  No indication for chest imaging.  No pulmonary symptoms, afebrile.  Reassuring clinical examination of the lungs.  No acute neurologic symptoms. ____________________________________________   PROCEDURES  Procedure(s) performed: None  Procedures  Critical Care performed: No  ____________________________________________   INITIAL IMPRESSION / ASSESSMENT AND PLAN / ED COURSE  Pertinent labs & imaging results that were available during my care of the patient were reviewed by me and considered in my medical decision making (see chart for details).  Patient presents for increased fatigue, but also reports dysuria.  Normal vital signs, reassuring examination.  Suspect possible urinary tract infection based on her clinical history.  Reassuring symptomatology.  No neurologic cardiac or pulmonary symptoms.  Receiving her normal hemodialysis and appears euvolemic, but given the history of fatigue occurring around the time of dialysis some slight dehydration is also considered.  Will hydrate, check labs including CBC BMP and urinalysis.  ----------------------------------------- 12:02 AM on 01/31/2018 -----------------------------------------  Patient agreeable and understanding of plan for discharge.  Consistent with  urinary tract infection.  Urine culture sent.  CBC normal except for mild anemia.  Urinalysis positive.  BMP reassuring, planned hemodialysis tomorrow.  Calcium is somewhat low, but doesn't appear to explain symptoms of dysuria and doubt acute change related to today's presentation.  Urinalysis, labs and clinical history discussed with Dr. Holley Raring who recommends treatment with amoxicillin once daily for the next 5 days.  Return precautions and treatment recommendations and follow-up discussed with the patient who is agreeable with the plan.       ____________________________________________   FINAL CLINICAL IMPRESSION(S) / ED DIAGNOSES  Final diagnoses:  Weakness  Urinary tract infection, acute      NEW MEDICATIONS STARTED DURING THIS VISIT:  New Prescriptions   AMOXICILLIN-CLAVULANATE (AUGMENTIN) 500-125 MG TABLET    Take 1 tablet (500 mg total) by mouth daily for 5 days. On the days that you have dialysis, take your medication after your hemodialysis session     Note:  This document was prepared using Dragon voice recognition software and may include unintentional dictation errors.     Delman Kitten, MD 01/31/18 0005

## 2018-01-30 NOTE — Discharge Instructions (Addendum)
You have been seen in the Emergency Department (ED) today for pain when urinating.  Your workup today suggests that you have a urinary tract infection (UTI).  Call your regular doctor to schedule the next available appointment to follow up on today?s ED visit, or return immediately to the ED if your pain worsens, you have decreased urine production, develop fever, persistent vomiting, or other symptoms that concern you.

## 2018-01-30 NOTE — ED Triage Notes (Addendum)
Pt to ED reporting increased weakness and dizziness today. Fall with no head injury or LOC reported. P had dialysis yesterday but reports this is not the normal level of weakness she feels after dialysis. Pt is alert and oriented x 4 upon arrival to ED but is reporting a headache. Pt had a stent placed in fistula yesterday as well.   EMS vitals:   CBG 135 98.9 F 76 NSR 109/53

## 2018-01-31 ENCOUNTER — Encounter: Payer: Self-pay | Admitting: Vascular Surgery

## 2018-02-01 LAB — URINE CULTURE
Culture: NO GROWTH
SPECIAL REQUESTS: NORMAL

## 2018-02-12 ENCOUNTER — Telehealth: Payer: Self-pay | Admitting: Internal Medicine

## 2018-02-12 NOTE — Telephone Encounter (Signed)
Kristie-please inform patient that no evidence of disease recurrence noted on the CT scan. Pt did not follow-up in the clinic.  Multiple missed appointments in the past.  Patient for now can continue to follow with urology.    # Patient is a port-which needs to be flushed every 2 months at least.

## 2018-02-15 NOTE — Telephone Encounter (Signed)
msg sent scheduling to arrange port flush and f/u

## 2018-02-18 ENCOUNTER — Telehealth: Payer: Self-pay

## 2018-02-18 NOTE — Telephone Encounter (Signed)
Voicemail left with Ms. Gilman to return call to discuss CT results and follow up.

## 2018-02-18 NOTE — Telephone Encounter (Signed)
Voicemail left with Ms. Engelken to return call to discuss CT results and discuss follow up.

## 2018-02-21 ENCOUNTER — Telehealth: Payer: Self-pay

## 2018-02-21 NOTE — Telephone Encounter (Signed)
Ms. Eblen returned call. Notified of CT results. No findings for metastatic disease involving the chest or osseous structures. Instructed to keep all follow up appointments with Urology. She will need top continue port flushes. She stated she has received in the mail, her appointments for port flush and to see Dr. Rogue Bussing in September. Oncology Nurse Navigator Documentation  Navigator Location: [facility] (02/21/18 1100)   )Navigator Encounter Type: Telephone (02/21/18 1100) Telephone: Outgoing Call;Diagnostic Results (02/21/18 1100)                                                  Time Spent with Patient: 15 (02/21/18 1100)

## 2018-02-25 ENCOUNTER — Other Ambulatory Visit: Payer: Self-pay

## 2018-02-25 ENCOUNTER — Emergency Department
Admission: EM | Admit: 2018-02-25 | Discharge: 2018-02-25 | Disposition: A | Discharge status: Home / Self Care | Payer: Medicare Other | Attending: Emergency Medicine | Admitting: Emergency Medicine

## 2018-02-25 ENCOUNTER — Ambulatory Visit (INDEPENDENT_AMBULATORY_CARE_PROVIDER_SITE_OTHER): Payer: Medicare Other

## 2018-02-25 ENCOUNTER — Encounter: Payer: Self-pay | Admitting: Emergency Medicine

## 2018-02-25 DIAGNOSIS — C679 Malignant neoplasm of bladder, unspecified: Secondary | ICD-10-CM

## 2018-02-25 DIAGNOSIS — R42 Dizziness and giddiness: Secondary | ICD-10-CM | POA: Insufficient documentation

## 2018-02-25 DIAGNOSIS — Z5321 Procedure and treatment not carried out due to patient leaving prior to being seen by health care provider: Secondary | ICD-10-CM | POA: Insufficient documentation

## 2018-02-25 NOTE — ED Notes (Signed)
Called x1 from the lobby

## 2018-02-25 NOTE — Progress Notes (Signed)
Pt present in office today for nurse visit. She is complaining of dysuria and flank/back pain. We attempted to catheterize pt for urine analysis, obtaining approximately 10-42m of urine, bright red in color. Bladder scan showed 058mof urine left in bladder. Pt did not tolerate catheterizing well, she experienced large amounts of discomfort. Pt states she just came from dialysis and has not been able to drink any water. She began feeling weak and tired, barely holding her eyes open. Pt bp checked at that time and showed 90/54. Pt was escorted to emergency room for further evaluation.

## 2018-02-25 NOTE — ED Notes (Signed)
Patient states she has a port, and refuses blood work in triage.

## 2018-02-25 NOTE — ED Triage Notes (Addendum)
Patient to Dr. Cherrie Gauze office (urology) and patient was feeling dizzy.  Patient states "my blood is low".  Patient is ESRD with HD on M-W-F.  States had dialysis today.  Patient also c/o vaginal bleeding today.

## 2018-03-01 LAB — CULTURE, URINE COMPREHENSIVE

## 2018-03-07 ENCOUNTER — Ambulatory Visit (INDEPENDENT_AMBULATORY_CARE_PROVIDER_SITE_OTHER): Payer: Medicare Other | Admitting: Urology

## 2018-03-07 ENCOUNTER — Encounter: Payer: Self-pay | Admitting: Urology

## 2018-03-07 VITALS — BP 129/61 | HR 93 | Temp 98.4°F | Ht 66.0 in | Wt 186.3 lb

## 2018-03-07 DIAGNOSIS — N3001 Acute cystitis with hematuria: Secondary | ICD-10-CM | POA: Diagnosis not present

## 2018-03-07 MED ORDER — CIPROFLOXACIN HCL 250 MG PO TABS: 250.0000 mg | ORAL_TABLET | Freq: Two times a day (BID) | ORAL | 0 refills | Status: DC

## 2018-03-07 NOTE — Progress Notes (Signed)
03/07/2018 2:42 PM   Barbara [name] Oct 19, 1940 [id number]  Referring provider: [name], MD [address] [place], [place] [address]  Chief Complaint  Patient presents with  . 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  She has an erythematous bladder in the past and had bilateral stents in 2018.  She has evidence of radiation cystitis.  A recent visit noted for her to continue surveillance cystoscopy every 6 months.  Last visit by Dr. Erlene Quan June 2019.  Apparently patient saw 1 of our nurses August 8 and urine was sent for culture.  Due to some computer issues the culture was not treated.  Today She has dysuria with some gross hematuria.  No fever.  Modifying factors: There are no other modifying factors  Associated signs and symptoms: There are no other associated signs and symptoms Aggravating and relieving factors: There are no other aggravating or relieving factors Severity: Moderate Duration: Persistent   PMH: Past Medical History:  Diagnosis Date  . Adult celiac disease   . Anemia associated with chronic renal failure 2017   blood transfusion last week 10/17  . Arthritis   . Asthma   . Cancer ([place]) 2017   bladder  . Chronic kidney disease   . 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  dr. Candiss Norse and dr. Holley Raring follow her  . COPD (chronic obstructive pulmonary disease) ([place])   . Coronary artery disease   . Diabetes mellitus without complication ([place])   . Dialysis patient ([place])    Tues, Thurs, Sat  . Dyspnea    with exertion  . Elevated lipids   . GERD (gastroesophageal reflux disease)   . Hematuria   . Hypertension   . Lower back pain   . Neuropathy   . Oxygen dependent    at hs  . Personal history of chemotherapy 2017   bladder ca  . Personal history of radiation therapy 2017   bladder ca  . Sleep apnea    uses cpap  . Urinary obstruction 01/2016     Surgical History: Past Surgical History:  Procedure Laterality Date  . ABDOMINAL HYSTERECTOMY    . AV FISTULA PLACEMENT Left 02/17/2017   Procedure: INSERTION OF ARTERIOVENOUS (AV) GORE-TEX GRAFT ARM(BRACHIAL AXILLARY);  Surgeon: Katha Cabal, MD;  Location: [facility];  Service: Vascular;  Laterality: Left;  . CYSTOSCOPY W/ RETROGRADES Bilateral 02/17/2016   Procedure: CYSTOSCOPY WITH RETROGRADE PYELOGRAM;  Surgeon: Hollice Espy, MD;  Location: [facility];  Service: Urology;  Laterality: Bilateral;  . CYSTOSCOPY W/ RETROGRADES Bilateral 10/12/2016   Procedure: CYSTOSCOPY WITH RETROGRADE PYELOGRAM;  Surgeon: Hollice Espy, MD;  Location: [facility];  Service: Urology;  Laterality: Bilateral;  . CYSTOSCOPY W/ RETROGRADES Bilateral 06/09/2017   Procedure: CYSTOSCOPY WITH RETROGRADE PYELOGRAM;  Surgeon: Hollice Espy, MD;  Location: [facility];  Service: Urology;  Laterality: Bilateral;  . CYSTOSCOPY W/ RETROGRADES Bilateral 10/07/2017   Procedure: CYSTOSCOPY WITH RETROGRADE PYELOGRAM;  Surgeon: Hollice Espy, MD;  Location: [facility];  Service: Urology;  Laterality: Bilateral;  . CYSTOSCOPY W/ URETERAL STENT PLACEMENT Left 05/12/2016   Procedure: CYSTOSCOPY WITH STENT REPLACEMENT;  Surgeon: Hollice Espy, MD;  Location: [facility];  Service: Urology;  Laterality: Left;  . CYSTOSCOPY W/ URETERAL STENT PLACEMENT Left 10/12/2016   Procedure: CYSTOSCOPY WITH STENT REPLACEMENT;  Surgeon: Hollice Espy, MD;  Location: [facility];  Service: Urology;  Laterality: Left;  . CYSTOSCOPY WITH BIOPSY  N/A 06/09/2017   Procedure: CYSTOSCOPY WITH BLADDER BIOPSY;  Surgeon: Hollice Espy, MD;  Location: [facility];  Service: Urology;  Laterality: N/A;  . CYSTOSCOPY WITH STENT PLACEMENT Left 01/21/2016   Procedure: CYSTOSCOPY WITH double J STENT PLACEMENT;  Surgeon: Franchot Gallo, MD;  Location: [facility];  Service: Urology;  Laterality: Left;  . CYSTOSCOPY WITH STENT PLACEMENT Right 10/12/2016   Procedure:  CYSTOSCOPY WITH STENT PLACEMENT;  Surgeon: Hollice Espy, MD;  Location: [facility];  Service: Urology;  Laterality: Right;  . DIALYSIS/PERMA CATHETER INSERTION N/A 11/20/2016   Procedure: Dialysis/Perma Catheter Insertion;  Surgeon: Algernon Huxley, MD;  Location: [place] [facility];  Service: Cardiovascular;  Laterality: N/A;  . DIALYSIS/PERMA CATHETER REMOVAL N/A 03/30/2017   Procedure: DIALYSIS/PERMA CATHETER REMOVAL;  Surgeon: Katha Cabal, MD;  Location: [place] [facility];  Service: Cardiovascular;  Laterality: N/A;  . KIDNEY SURGERY  01/21/2016   IR NEPHROSTOMY PLACEMENT LEFT   . PERIPHERAL VASCULAR CATHETERIZATION N/A 04/07/2016   Procedure: Glori Luis Cath Insertion;  Surgeon: Katha Cabal, MD;  Location: [place] [facility];  Service: Cardiovascular;  Laterality: N/A;  . PERIPHERAL VASCULAR THROMBECTOMY Left 06/02/2017   Procedure: PERIPHERAL VASCULAR THROMBECTOMY;  Surgeon: Algernon Huxley, MD;  Location: [facility] [facility];  Service: Cardiovascular;  Laterality: Left;  . PERIPHERAL VASCULAR THROMBECTOMY Left 01/28/2018   Procedure: PERIPHERAL VASCULAR THROMBECTOMY;  Surgeon: Katha Cabal, MD;  Location: [place] [facility];  Service: Cardiovascular;  Laterality: Left;  . PORTACATH PLACEMENT Right   . ROTATOR CUFF REPAIR Left   . TRANSURETHRAL RESECTION OF BLADDER TUMOR N/A 02/17/2016   Procedure: TRANSURETHRAL RESECTION OF BLADDER TUMOR (TURBT)-LARGE;  Surgeon: Hollice Espy, MD;  Location: [facility];  Service: Urology;  Laterality: N/A;  . TRANSURETHRAL RESECTION OF BLADDER TUMOR N/A 10/07/2017   Procedure: TRANSURETHRAL RESECTION OF BLADDER TUMOR (TURBT)-small;  Surgeon: Hollice Espy, MD;  Location: [facility];  Service: Urology;  Laterality: N/A;  . URETEROSCOPY Left 02/17/2016   Procedure: URETEROSCOPY;  Surgeon: Hollice Espy, MD;  Location: [facility];  Service: Urology;  Laterality: Left;  . URETEROSCOPY Right 10/12/2016   Procedure: URETEROSCOPY;  Surgeon: Hollice Espy, MD;  Location: [facility];  Service: Urology;  Laterality: Right;    Home Medications:  Allergies as of 03/07/2018      Reactions   Glipizide Er    Dropped blood sugar too much   Percocet [oxycodone-acetaminophen] Hives      Medication List        Accurate as of 03/07/18  2:42 PM. Always use your most recent med list.          ACCU-CHEK AVIVA PLUS test strip Generic drug:  glucose blood 1 each by Other route daily. use as directed   ACCU-CHEK AVIVA PLUS w/Device Kit (USE TO MONITOR BLOOD SUGARS.)   ACCU-CHEK SOFTCLIX LANCETS lancets INJECT 1 EACH AS DIRECTED ONCE DAILY.   acetaminophen 500 MG tablet Commonly known as:  TYLENOL Take 1,000 mg by mouth 2 (two) times daily as needed for moderate pain or headache.   atorvastatin 40 MG tablet Commonly known as:  LIPITOR Take 40 mg by mouth daily.   calcium acetate 667 MG capsule Commonly known as:  PHOSLO Take 2 capsules (1,334 mg total) by mouth 3 (three) times daily with meals.   cholecalciferol 1000 units tablet Commonly known as:  VITAMIN D Take 1,000 Units by mouth daily.   esomeprazole 40 MG capsule Commonly known as:  NEXIUM Take 40 mg by  mouth daily after breakfast.   ferrous sulfate 325 (65 FE) MG EC tablet Take 325 mg by mouth daily after breakfast.   furosemide 40 MG tablet Commonly known as:  LASIX Take 40 mg by mouth 2 (two) times daily. Take morning dose every day. Lunch dose as needed.   gabapentin 300 MG capsule Commonly known as:  NEURONTIN Take 600 mg by mouth 3 (three) times daily.   lidocaine 5 % ointment Commonly known as:  XYLOCAINE Apply 1 application topically 2 (two) times daily as needed for mild pain.   NON FORMULARY Place 1 Units into the nose at bedtime. CPAP Time of use 2100-0600   oxybutynin 5 MG tablet Commonly known as:  DITROPAN Take 5 mg by mouth 3 (three) times daily.   OXYGEN Inhale 2 L into the lungs at bedtime.   PROAIR RESPICLICK 073 (90 Base) MCG/ACT  Aepb Generic drug:  Albuterol Sulfate Inhale 2 puffs into the lungs 4 (four) times daily as needed (wheezing).       Allergies:  Allergies  Allergen Reactions  . Glipizide Er     Dropped blood sugar too much  . Percocet [Oxycodone-Acetaminophen] Hives    Family History: Family History  Problem Relation Age of Onset  . Diabetes Mother   . Cancer Father   . Breast cancer Neg Hx   . Kidney disease Neg Hx     Social History:  reports that she quit smoking about 15 years ago. Her smoking use included cigarettes. She has never used smokeless tobacco. She reports that she does not drink alcohol or use drugs.  ROS: UROLOGY Frequent Urination?: No Hard to postpone urination?: Yes Burning/pain with urination?: Yes Get up at night to urinate?: No Leakage of urine?: No Urine stream starts and stops?: Yes Trouble starting stream?: Yes Do you have to strain to urinate?: Yes Blood in urine?: No Urinary tract infection?: Yes Sexually transmitted disease?: No Injury to kidneys or bladder?: No Painful intercourse?: No Weak stream?: No Currently pregnant?: No Vaginal bleeding?: No Last menstrual period?: n  Gastrointestinal Nausea?: No Vomiting?: No Indigestion/heartburn?: No Diarrhea?: No Constipation?: No  Constitutional Fever: No Night sweats?: No Weight loss?: No Fatigue?: No  Skin Skin rash/lesions?: No Itching?: No  Eyes Blurred vision?: No Double vision?: No  Ears/Nose/Throat Sore throat?: No Sinus problems?: No  Hematologic/Lymphatic Swollen glands?: No Easy bruising?: No  Cardiovascular Leg swelling?: No Chest pain?: No  Respiratory Cough?: No Shortness of breath?: No  Endocrine Excessive thirst?: No  Musculoskeletal Back pain?: No Joint pain?: No  Neurological Headaches?: No Dizziness?: No  Psychologic Depression?: Yes Anxiety?: No  Physical Exam: BP 129/61 (BP Location: Right Arm, Patient Position: Sitting, Cuff Size: Normal)    Pulse 93   Temp 98.4 F (36.9 C) (Oral)   Ht 5' 6"  (1.676 m)   Wt 186 lb 4.8 oz (84.5 kg)   BMI 30.07 kg/m   Constitutional:  Alert and oriented, No acute distress.   Laboratory Data: Lab Results  Component Value Date   WBC 4.9 01/30/2018   HGB 10.3 (L) 01/30/2018   HCT 30.3 (L) 01/30/2018   MCV 100.1 (H) 01/30/2018   PLT 158 01/30/2018    Lab Results  Component Value Date   CREATININE 10.06 (H) 01/30/2018    No results found for: PSA  No results found for: TESTOSTERONE  Lab Results  Component Value Date   HGBA1C 4.8 01/27/2017    Urinalysis    Component Value Date/Time   COLORURINE  YELLOW (A) 01/30/2018 2055   APPEARANCEUR CLOUDY (A) 01/30/2018 2055   APPEARANCEUR Cloudy (A) 12/28/2017 1007   LABSPEC 1.015 01/30/2018 2055   LABSPEC 1.012 08/02/2012 1114   PHURINE 7.0 01/30/2018 2055   GLUCOSEU NEGATIVE 01/30/2018 2055   GLUCOSEU Negative 08/02/2012 1114   HGBUR LARGE (A) 01/30/2018 2055   BILIRUBINUR NEGATIVE 01/30/2018 2055   BILIRUBINUR Negative 12/28/2017 1007   BILIRUBINUR Negative 08/02/2012 [place] 01/30/2018 2055   PROTEINUR 100 (A) 01/30/2018 2055   NITRITE NEGATIVE 01/30/2018 2055   LEUKOCYTESUR MODERATE (A) 01/30/2018 2055   LEUKOCYTESUR 1+ (A) 12/28/2017 1007   LEUKOCYTESUR Negative 08/02/2012 1114    Pertinent Imaging:   Assessment & Plan: Called in ciprofloxacin 250 mg twice a day for 1 week based upon urine culture.  She will let us know if she does not improve.  She will schedule for cancer surveillance understands that her other diagnoses may be a contributing factor for the blood in the urine  There are no diagnoses linked to this encounter.  No follow-ups on file.  Reece Packer, MD  [facility] [address], [place], [place] [address] [phone]

## 2018-03-07 NOTE — Addendum Note (Signed)
Addended by: Tommy Rainwater on: 03/07/2018 02:45 PM   Modules accepted: Orders

## 2018-03-10 ENCOUNTER — Encounter: Payer: Self-pay | Admitting: Podiatry

## 2018-03-10 ENCOUNTER — Ambulatory Visit (INDEPENDENT_AMBULATORY_CARE_PROVIDER_SITE_OTHER): Payer: Medicare Other | Admitting: Podiatry

## 2018-03-10 DIAGNOSIS — E1142 Type 2 diabetes mellitus with diabetic polyneuropathy: Secondary | ICD-10-CM | POA: Diagnosis not present

## 2018-03-10 DIAGNOSIS — M79674 Pain in right toe(s): Secondary | ICD-10-CM | POA: Diagnosis not present

## 2018-03-10 DIAGNOSIS — B351 Tinea unguium: Secondary | ICD-10-CM | POA: Diagnosis not present

## 2018-03-10 DIAGNOSIS — M79675 Pain in left toe(s): Secondary | ICD-10-CM | POA: Diagnosis not present

## 2018-03-10 NOTE — Progress Notes (Signed)
Complaint:  Visit Type: Patient returns to my office for continued preventative foot care services. Complaint: Patient states" my nails have grown long and thick and become painful to walk and wear shoes" Patient has been diagnosed with DM with no foot complications. The patient presents for preventative foot care services. No changes to ROS  Podiatric Exam: Vascular: dorsalis pedis and posterior tibial pulses are palpable bilateral. Capillary return is immediate. Temperature gradient is WNL. Skin turgor WNL  Sensorium: Diminished  Semmes Weinstein monofilament test. Normal tactile sensation bilaterally. Nail Exam: Pt has thick disfigured discolored nails with subungual debris noted bilateral entire nail hallux through fifth toenails Ulcer Exam: There is no evidence of ulcer or pre-ulcerative changes or infection. Orthopedic Exam: Muscle tone and strength are WNL. No limitations in general ROM. No crepitus or effusions noted. Foot type and digits show no abnormalities. Bony prominences are unremarkable. Skin: No Porokeratosis. No infection or ulcers  Diagnosis:  Onychomycosis, , Pain in right toe, pain in left toes  Treatment & Plan Procedures and Treatment: Consent by patient was obtained for treatment procedures.   Debridement of mycotic and hypertrophic toenails, 1 through 5 bilateral and clearing of subungual debris. No ulceration, no infection noted.  Return Visit-Office Procedure: Patient instructed to return to the office for a follow up visit 3 months for continued evaluation and treatment.    Gardiner Barefoot DPM

## 2018-03-11 ENCOUNTER — Encounter (INDEPENDENT_AMBULATORY_CARE_PROVIDER_SITE_OTHER): Payer: Self-pay

## 2018-03-14 ENCOUNTER — Encounter: Payer: Self-pay | Admitting: Anesthesiology

## 2018-03-14 ENCOUNTER — Other Ambulatory Visit (INDEPENDENT_AMBULATORY_CARE_PROVIDER_SITE_OTHER): Payer: Self-pay | Admitting: Nurse Practitioner

## 2018-03-14 ENCOUNTER — Encounter
Admission: RE | Disposition: A | Discharge status: Home / Self Care | Payer: Self-pay | Source: Ambulatory Visit | Attending: Vascular Surgery

## 2018-03-14 ENCOUNTER — Ambulatory Visit
Admission: RE | Admit: 2018-03-14 | Discharge: 2018-03-14 | Disposition: A | Discharge status: Home / Self Care | Payer: Medicare Other | Source: Ambulatory Visit | Attending: Vascular Surgery | Admitting: Vascular Surgery

## 2018-03-14 ENCOUNTER — Encounter: Payer: Self-pay | Admitting: Emergency Medicine

## 2018-03-14 DIAGNOSIS — Z9889 Other specified postprocedural states: Secondary | ICD-10-CM | POA: Insufficient documentation

## 2018-03-14 DIAGNOSIS — Z923 Personal history of irradiation: Secondary | ICD-10-CM | POA: Insufficient documentation

## 2018-03-14 DIAGNOSIS — Z888 Allergy status to other drugs, medicaments and biological substances status: Secondary | ICD-10-CM | POA: Insufficient documentation

## 2018-03-14 DIAGNOSIS — Y832 Surgical operation with anastomosis, bypass or graft as the cause of abnormal reaction of the patient, or of later complication, without mention of misadventure at the time of the procedure: Secondary | ICD-10-CM | POA: Insufficient documentation

## 2018-03-14 DIAGNOSIS — M1990 Unspecified osteoarthritis, unspecified site: Secondary | ICD-10-CM | POA: Insufficient documentation

## 2018-03-14 DIAGNOSIS — I120 Hypertensive chronic kidney disease with stage 5 chronic kidney disease or end stage renal disease: Secondary | ICD-10-CM | POA: Insufficient documentation

## 2018-03-14 DIAGNOSIS — J449 Chronic obstructive pulmonary disease, unspecified: Secondary | ICD-10-CM | POA: Insufficient documentation

## 2018-03-14 DIAGNOSIS — Z936 Other artificial openings of urinary tract status: Secondary | ICD-10-CM | POA: Diagnosis not present

## 2018-03-14 DIAGNOSIS — Z9981 Dependence on supplemental oxygen: Secondary | ICD-10-CM | POA: Diagnosis not present

## 2018-03-14 DIAGNOSIS — D631 Anemia in chronic kidney disease: Secondary | ICD-10-CM | POA: Diagnosis not present

## 2018-03-14 DIAGNOSIS — Z9221 Personal history of antineoplastic chemotherapy: Secondary | ICD-10-CM | POA: Diagnosis not present

## 2018-03-14 DIAGNOSIS — E119 Type 2 diabetes mellitus without complications: Secondary | ICD-10-CM

## 2018-03-14 DIAGNOSIS — Z809 Family history of malignant neoplasm, unspecified: Secondary | ICD-10-CM | POA: Insufficient documentation

## 2018-03-14 DIAGNOSIS — E1140 Type 2 diabetes mellitus with diabetic neuropathy, unspecified: Secondary | ICD-10-CM | POA: Insufficient documentation

## 2018-03-14 DIAGNOSIS — Z960 Presence of urogenital implants: Secondary | ICD-10-CM | POA: Diagnosis not present

## 2018-03-14 DIAGNOSIS — N186 End stage renal disease: Secondary | ICD-10-CM

## 2018-03-14 DIAGNOSIS — G4730 Sleep apnea, unspecified: Secondary | ICD-10-CM | POA: Diagnosis not present

## 2018-03-14 DIAGNOSIS — T82868A Thrombosis of vascular prosthetic devices, implants and grafts, initial encounter: Secondary | ICD-10-CM | POA: Insufficient documentation

## 2018-03-14 DIAGNOSIS — Z833 Family history of diabetes mellitus: Secondary | ICD-10-CM | POA: Insufficient documentation

## 2018-03-14 DIAGNOSIS — E1122 Type 2 diabetes mellitus with diabetic chronic kidney disease: Secondary | ICD-10-CM | POA: Insufficient documentation

## 2018-03-14 DIAGNOSIS — K900 Celiac disease: Secondary | ICD-10-CM | POA: Diagnosis not present

## 2018-03-14 DIAGNOSIS — Z90710 Acquired absence of both cervix and uterus: Secondary | ICD-10-CM | POA: Diagnosis not present

## 2018-03-14 DIAGNOSIS — I10 Essential (primary) hypertension: Secondary | ICD-10-CM | POA: Diagnosis not present

## 2018-03-14 DIAGNOSIS — Z87891 Personal history of nicotine dependence: Secondary | ICD-10-CM | POA: Diagnosis not present

## 2018-03-14 DIAGNOSIS — Z992 Dependence on renal dialysis: Secondary | ICD-10-CM | POA: Diagnosis not present

## 2018-03-14 DIAGNOSIS — Z885 Allergy status to narcotic agent status: Secondary | ICD-10-CM | POA: Insufficient documentation

## 2018-03-14 DIAGNOSIS — Z8551 Personal history of malignant neoplasm of bladder: Secondary | ICD-10-CM | POA: Diagnosis not present

## 2018-03-14 DIAGNOSIS — I2510 Atherosclerotic heart disease of native coronary artery without angina pectoris: Secondary | ICD-10-CM | POA: Diagnosis not present

## 2018-03-14 DIAGNOSIS — K219 Gastro-esophageal reflux disease without esophagitis: Secondary | ICD-10-CM | POA: Insufficient documentation

## 2018-03-14 HISTORY — PX: PERIPHERAL VASCULAR THROMBECTOMY: CATH118306

## 2018-03-14 LAB — GLUCOSE, CAPILLARY
GLUCOSE-CAPILLARY: 82 mg/dL (ref 70–99)
GLUCOSE-CAPILLARY: 92 mg/dL (ref 70–99)

## 2018-03-14 LAB — POTASSIUM (ARMC VASCULAR LAB ONLY): POTASSIUM (ARMC VASCULAR LAB): 4.3 (ref 3.5–5.1)

## 2018-03-14 SURGERY — PERIPHERAL VASCULAR THROMBECTOMY
Anesthesia: Moderate Sedation | Laterality: Left

## 2018-03-14 MED ORDER — MIDAZOLAM HCL 5 MG/5ML IJ SOLN
INTRAMUSCULAR | Status: AC
  Filled 2018-03-14: qty 5

## 2018-03-14 MED ORDER — HEPARIN SOD (PORK) LOCK FLUSH 100 UNIT/ML IV SOLN
500.0000 [IU] | Freq: Once | INTRAVENOUS | Status: AC
  Administered 2018-03-14: 500 [IU] via INTRAVENOUS

## 2018-03-14 MED ORDER — HYDRALAZINE HCL 20 MG/ML IJ SOLN
10.0000 mg | Freq: Once | INTRAMUSCULAR | Status: AC
  Administered 2018-03-14: 10 mg via INTRAVENOUS

## 2018-03-14 MED ORDER — HYDROMORPHONE HCL 1 MG/ML IJ SOLN: 1.0000 mg | Freq: Once | INTRAMUSCULAR | Status: DC | PRN

## 2018-03-14 MED ORDER — FENTANYL CITRATE (PF) 100 MCG/2ML IJ SOLN
INTRAMUSCULAR | Status: AC
  Filled 2018-03-14: qty 2

## 2018-03-14 MED ORDER — FENTANYL CITRATE (PF) 100 MCG/2ML IJ SOLN
INTRAMUSCULAR | Status: DC | PRN
  Administered 2018-03-14: 50 ug via INTRAVENOUS

## 2018-03-14 MED ORDER — IOPAMIDOL (ISOVUE-300) INJECTION 61%
INTRAVENOUS | Status: DC | PRN
  Administered 2018-03-14: 30 mL via INTRAVENOUS

## 2018-03-14 MED ORDER — SODIUM CHLORIDE 0.9 % IV SOLN
INTRAVENOUS | Status: DC
  Administered 2018-03-14: 13:00:00 via INTRAVENOUS

## 2018-03-14 MED ORDER — HEPARIN SOD (PORK) LOCK FLUSH 100 UNIT/ML IV SOLN
INTRAVENOUS | Status: AC
  Administered 2018-03-14: 500 [IU] via INTRAVENOUS
  Filled 2018-03-14: qty 5

## 2018-03-14 MED ORDER — MIDAZOLAM HCL 2 MG/2ML IJ SOLN
INTRAMUSCULAR | Status: DC | PRN
  Administered 2018-03-14: 2 mg via INTRAVENOUS

## 2018-03-14 MED ORDER — ALTEPLASE 2 MG IJ SOLR
INTRAMUSCULAR | Status: AC
  Filled 2018-03-14: qty 8

## 2018-03-14 MED ORDER — HEPARIN SODIUM (PORCINE) 1000 UNIT/ML IJ SOLN
INTRAMUSCULAR | Status: DC | PRN
  Administered 2018-03-14: 4000 [IU] via INTRAVENOUS

## 2018-03-14 MED ORDER — HYDRALAZINE HCL 20 MG/ML IJ SOLN
INTRAMUSCULAR | Status: AC
  Administered 2018-03-14: 10 mg via INTRAVENOUS
  Filled 2018-03-14: qty 1

## 2018-03-14 MED ORDER — ALTEPLASE 2 MG IJ SOLR
INTRAMUSCULAR | Status: DC | PRN
  Administered 2018-03-14: 8 mg

## 2018-03-14 MED ORDER — LABETALOL HCL 5 MG/ML IV SOLN
INTRAVENOUS | Status: AC
  Administered 2018-03-14: 10 mg via INTRAVENOUS
  Filled 2018-03-14: qty 4

## 2018-03-14 MED ORDER — LABETALOL HCL 5 MG/ML IV SOLN
10.0000 mg | Freq: Once | INTRAVENOUS | Status: AC
  Administered 2018-03-14: 10 mg via INTRAVENOUS

## 2018-03-14 MED ORDER — ONDANSETRON HCL 4 MG/2ML IJ SOLN: 4.0000 mg | Freq: Four times a day (QID) | INTRAMUSCULAR | Status: DC | PRN

## 2018-03-14 MED ORDER — HEPARIN SODIUM (PORCINE) 1000 UNIT/ML IJ SOLN
INTRAMUSCULAR | Status: AC
  Filled 2018-03-14: qty 1

## 2018-03-14 MED ORDER — CEFAZOLIN SODIUM-DEXTROSE 1-4 GM/50ML-% IV SOLN
1.0000 g | Freq: Once | INTRAVENOUS | Status: AC
  Administered 2018-03-14: 1 g via INTRAVENOUS

## 2018-03-14 SURGICAL SUPPLY — 16 items
BALLN DORADO 8X100X80 (BALLOONS) ×2
BALLN DORADO7X100X80 (BALLOONS) ×2
BALLOON DORADO 8X100X80 (BALLOONS) ×1 IMPLANT
BALLOON DORADO7X100X80 (BALLOONS) ×1 IMPLANT
CANNULA 5F STIFF (CANNULA) ×2 IMPLANT
CATH BEACON 5 .035 40 KMP TP (CATHETERS) ×1 IMPLANT
CATH BEACON 5 .038 40 KMP TP (CATHETERS) ×1
CATH EMBOLECTOMY 5FR (BALLOONS) ×2 IMPLANT
DEVICE PRESTO INFLATION (MISCELLANEOUS) ×2 IMPLANT
DRAPE BRACHIAL (DRAPES) ×2 IMPLANT
PACK ANGIOGRAPHY (CUSTOM PROCEDURE TRAY) ×2 IMPLANT
SET AVX THROMB ULT (MISCELLANEOUS) ×2 IMPLANT
SHEATH BRITE TIP 6FRX5.5 (SHEATH) ×4 IMPLANT
SUT MNCRL AB 4-0 PS2 18 (SUTURE) ×2 IMPLANT
TOWEL OR 17X26 4PK STRL BLUE (TOWEL DISPOSABLE) ×2 IMPLANT
WIRE MAGIC TOR.035 180C (WIRE) ×4 IMPLANT

## 2018-03-14 NOTE — Progress Notes (Signed)
Pt with increasing hypertension Post procedure. Dr. Lucky Cowboy paged and updated on Pt status and VS. Labetalol 10 mg IVP ordered and given. Will monitor closely. Otherwise Pt stable, left arm site intact.

## 2018-03-14 NOTE — Progress Notes (Signed)
Pt found at 1640 with acute golf ball size hematoma at left arm AC site. Pressure held for 10 minutes and site softened. Dr. Lucky Cowboy notified and agrees with holding pressure and monitoring. Care assumed and site assessed per [facility].

## 2018-03-14 NOTE — Op Note (Signed)
[facility]    OPERATIVE NOTE   PROCEDURE: 1.  Left brachial artery to axillary vein arteriovenous graft cannulation under ultrasound guidance in both a retrograde and then antegrade fashion crossing 2.  Left arm shuntogram and central venogram 3.  Catheter directed thrombolysis with 8 mg of TPA delivered with the AngioJet AVX catheter 4.  Mechanical rheolytic thrombectomy to the left brachial artery to axillary vein with the AngioJet AVX catheter 5.  Fogarty embolectomy for residual arterial plug 6.  Percutaneous transluminal angioplasty of arterial anastomosis with 7 mm diameter high-pressure angioplasty balloon 7.  Percutaneous transluminal angioplasty of the mid and distal graft and venous anastomosis with 8 mm diameter high-pressure angioplasty balloon  PRE-OPERATIVE DIAGNOSIS: 1. ESRD 2.  Thrombosed left brachial artery to axillary vein arteriovenous graft  POST-OPERATIVE DIAGNOSIS: same as above   SURGEON: Leotis Pain, MD  ANESTHESIA: local with Moderate Conscious Sedation for approximately 25 minutes using 2 mg of Versed and 50 Mcg of Fentanyl  ESTIMATED BLOOD LOSS: 15 cc cc  FINDING(S): 1. Thrombosed graft  SPECIMEN(S):  None  CONTRAST: 30 cc  FLUORO TIME: 2.9 minutes  INDICATIONS: Patient is a 77 y.o.female who presents with a thrombosed left brachial artery to axillary vein arteriovenous graft.  The patient is scheduled for an attempted declot and shuntogram.  The patient is aware the risks include but are not limited to: bleeding, infection, thrombosis of the cannulated access, and possible anaphylactic reaction to the contrast.  The patient is aware of the risks of the procedure and elects to proceed forward.  DESCRIPTION: After full informed written consent was obtained, the patient was brought back to the angiography suite and placed supine upon the angiography table.  The patient was connected to monitoring equipment. Moderate conscious  sedation was administered with a face to face encounter with the patient throughout the procedure with my supervision of the RN administering medicines and monitoring the patient's vital signs, pulse oximetry, telemetry and mental status throughout from the start of the procedure until the patient was taken to the recovery room. The left arm was prepped and draped in the standard fashion for a percutaneous access intervention.  Under ultrasound guidance, the left brachial artery to axillary vein arteriovenous graft was cannulated with a micropuncture needle under direct ultrasound guidance due to the pulseless nature of the graft in both an antegrade and a retrograde fashion crossing, and permanent images were performed.  The microwire was advanced and the needle was exchanged for the a microsheath.  I then upsized to a 6 Fr Sheath and imaging was performed.  Hand injections were completed to image the access including the central venous system. This demonstrated no flow within the AV graft.  Based on the images, this patient will need extensive treatment to salvage the graft. I then gave the patient 4000 units of intravenous heparin.  I then placed a Magic torque wire into the brachial artery from the retrograde sheath and into the axillary vein from the antegrade sheath. 8 mg of TPA were deployed throughout the entirety of the graft and into the axillary vein.  Mechanical rheolytic thrombectomy was then performed throughout the graft and into the axillary vein. This uncovered a venous anastomotic stenosis of about 70 to 80% as well as thrombus and stenosis in the mid graft creating at least a 70% stenosis.  A residual arterial plug was also seen at the arterial anastomosis. An attempt to clear the arterial plug was done with 2 passes  of the Fogarty embolectomy balloon. Flow-limiting arterial plug remained, and I elected to treat this lesion with a 7 mm diameter by 10 cm length high-pressure angioplasty balloon  inflated to 24 atm for 1 minute. This resulted in resolution of the arterial plug, and clearance of the arterial side of the graft with less than 10% residual stenosis in this location. The arterial outflow was seen to be intact through the radial and ulnar arteries as well on these images. The retrograde sheath was removed. I then turned my attention to the thrombus in the distal graft and the axillary vein. Mechanical rheolytic thrombectomy was performed. This resulted in mild improvement.  I then elected to treat this with 2 inflations with an 8 mm diameter by 10 cm length high-pressure angioplasty balloon inflated to 14 to 16 atm for 1 minute.  Completion imaging showed only about a 20% residual stenosis in the mid graft and about a 10% residual stenosis at the venous anastomosis.  The remainder of the central venous circulation was widely patent.  There is now flow within the graft clinically.    Based on the completion imaging, no further intervention is necessary.  The wire and balloon were removed from the sheath.  A 4-0 Monocryl purse-string suture was sewn around the sheath.  The sheath was removed while tying down the suture.  A sterile bandage was applied to the puncture site.  COMPLICATIONS: None  CONDITION: Stable   Leotis Pain 03/14/2018 3:22 PM   This note was created with Dragon Medical transcription system. Any errors in dictation are purely unintentional.

## 2018-03-14 NOTE — H&P (Signed)
[facility] Admission History & Physical  MRN : [id number]  Barbara [name] is a 77 y.o. (02/09/1941) female who presents with chief complaint of No chief complaint on file. Marland Kitchen  History of Present Illness: I am asked to evaluate the patient by the dialysis center. The patient was sent here because they were unable to cannulate the graft Friday. Furthermore the Center states there is no thrill or bruit. The patient states this is the first dialysis run to be missed. This problem is acute in onset and has been present for approximately 3 days. The patient is unaware of any other change.  Patient denies pain or tenderness overlying the access.  There is no pain with dialysis.  The patient denies hand pain or finger pain consistent with steal syndrome.   There have not been any recent past interventions or declots of this access.  The patient is not chronically hypotensive on dialysis.  Current Facility-Administered Medications  Medication Dose Route Frequency Provider Last Rate Last Dose  . 0.9 %  sodium chloride infusion   Intravenous Continuous Eulogio Ditch E, NP      . ceFAZolin (ANCEF) IVPB 1 g/50 mL premix  1 g Intravenous Once Eulogio Ditch E, NP      . HYDROmorphone (DILAUDID) injection 1 mg  1 mg Intravenous Once PRN Kris Hartmann, NP      . ondansetron (ZOFRAN) injection 4 mg  4 mg Intravenous Q6H PRN Kris Hartmann, NP        Past Medical History:  Diagnosis Date  . Adult celiac disease   . Anemia associated with chronic renal failure 2017   blood transfusion last week 10/17  . Arthritis   . Asthma   . 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 Chronic kidney disease   . 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  dr. Candiss Norse and dr. Holley Raring follow her  . COPD (chronic obstructive pulmonary disease) ([place])   . Coronary artery disease   . Diabetes mellitus without complication ([place])   . Dialysis patient ([place])    Tues, Thurs, Sat  . Dyspnea    with exertion  . Elevated lipids   . GERD (gastroesophageal reflux disease)   . Hematuria   . Hypertension   . Lower back pain   . Neuropathy   . Oxygen dependent    at hs  . Personal history of chemotherapy 2017   bladder ca  . Personal history of radiation therapy 2017   bladder ca  . Sleep apnea    uses cpap  . Urinary obstruction 01/2016    Past Surgical History:  Procedure Laterality Date  . ABDOMINAL HYSTERECTOMY    . AV FISTULA PLACEMENT Left 02/17/2017   Procedure: INSERTION OF ARTERIOVENOUS (AV) GORE-TEX GRAFT ARM(BRACHIAL AXILLARY);  Surgeon: Katha Cabal, MD;  Location: [facility];  Service: Vascular;  Laterality: Left;  . CYSTOSCOPY W/ RETROGRADES Bilateral 02/17/2016   Procedure: CYSTOSCOPY WITH RETROGRADE PYELOGRAM;  Surgeon: Hollice Espy, MD;  Location: [facility];  Service: Urology;  Laterality: Bilateral;  . CYSTOSCOPY W/ RETROGRADES Bilateral 10/12/2016   Procedure: CYSTOSCOPY WITH RETROGRADE PYELOGRAM;  Surgeon: Hollice Espy, MD;  Location: [facility];  Service: Urology;  Laterality: Bilateral;  . CYSTOSCOPY W/ RETROGRADES Bilateral 06/09/2017   Procedure: CYSTOSCOPY WITH RETROGRADE PYELOGRAM;  Surgeon: Hollice Espy, MD;  Location: [facility];  Service: Urology;  Laterality: Bilateral;  . CYSTOSCOPY W/ RETROGRADES Bilateral 10/07/2017   Procedure: CYSTOSCOPY  WITH RETROGRADE PYELOGRAM;  Surgeon: Hollice Espy, MD;  Location: [facility];  Service: Urology;  Laterality: Bilateral;  . CYSTOSCOPY W/ URETERAL STENT PLACEMENT Left 05/12/2016   Procedure: CYSTOSCOPY WITH STENT REPLACEMENT;  Surgeon: Hollice Espy, MD;  Location: [facility];  Service: Urology;  Laterality: Left;  . CYSTOSCOPY W/ URETERAL STENT PLACEMENT Left 10/12/2016   Procedure: CYSTOSCOPY WITH STENT REPLACEMENT;  Surgeon: Hollice Espy, MD;  Location: [facility];  Service: Urology;  Laterality: Left;  . CYSTOSCOPY WITH BIOPSY N/A 06/09/2017   Procedure: CYSTOSCOPY WITH BLADDER BIOPSY;  Surgeon: Hollice Espy, MD;  Location: [facility];  Service: Urology;  Laterality: N/A;  . CYSTOSCOPY WITH STENT PLACEMENT Left 01/21/2016   Procedure: CYSTOSCOPY WITH double J STENT PLACEMENT;  Surgeon: Franchot Gallo, MD;  Location: [facility];  Service: Urology;  Laterality: Left;  . CYSTOSCOPY WITH STENT PLACEMENT Right 10/12/2016   Procedure: CYSTOSCOPY WITH STENT PLACEMENT;  Surgeon: Hollice Espy, MD;  Location: [facility];  Service: Urology;  Laterality: Right;  . DIALYSIS/PERMA CATHETER INSERTION N/A 11/20/2016   Procedure: Dialysis/Perma Catheter Insertion;  Surgeon: Algernon Huxley, MD;  Location: [facility];  Service: Cardiovascular;  Laterality: N/A;  . DIALYSIS/PERMA CATHETER REMOVAL N/A 03/30/2017   Procedure: DIALYSIS/PERMA CATHETER REMOVAL;  Surgeon: Katha Cabal, MD;  Location: [facility];  Service: Cardiovascular;  Laterality: N/A;  . KIDNEY SURGERY  01/21/2016   IR NEPHROSTOMY PLACEMENT LEFT   . PERIPHERAL VASCULAR CATHETERIZATION N/A 04/07/2016   Procedure: Glori Luis Cath Insertion;  Surgeon: Katha Cabal, MD;  Location: [facility];  Service: Cardiovascular;  Laterality: N/A;  . PERIPHERAL VASCULAR THROMBECTOMY Left 06/02/2017   Procedure: PERIPHERAL VASCULAR THROMBECTOMY;  Surgeon: Algernon Huxley, MD;  Location: [facility];  Service: Cardiovascular;  Laterality: Left;  . PERIPHERAL VASCULAR THROMBECTOMY Left 01/28/2018   Procedure: PERIPHERAL VASCULAR THROMBECTOMY;  Surgeon: Katha Cabal, MD;  Location: [facility];  Service: Cardiovascular;  Laterality: Left;  . PORTACATH PLACEMENT Right   . ROTATOR CUFF REPAIR Left   . TRANSURETHRAL RESECTION OF BLADDER TUMOR N/A 02/17/2016   Procedure: TRANSURETHRAL RESECTION OF BLADDER TUMOR (TURBT)-LARGE;  Surgeon: Hollice Espy, MD;  Location: [facility];  Service: Urology;  Laterality: N/A;  . TRANSURETHRAL RESECTION OF BLADDER TUMOR N/A 10/07/2017   Procedure: TRANSURETHRAL RESECTION OF BLADDER TUMOR  (TURBT)-small;  Surgeon: Hollice Espy, MD;  Location: [facility];  Service: Urology;  Laterality: N/A;  . URETEROSCOPY Left 02/17/2016   Procedure: URETEROSCOPY;  Surgeon: Hollice Espy, MD;  Location: [facility];  Service: Urology;  Laterality: Left;  . URETEROSCOPY Right 10/12/2016   Procedure: URETEROSCOPY;  Surgeon: Hollice Espy, MD;  Location: [facility];  Service: Urology;  Laterality: Right;    Social History Social History   Tobacco Use  . Smoking status: Former Smoker    Types: Cigarettes    Last attempt to quit: 07/20/2002    Years since quitting: 15.6  . Smokeless tobacco: Never Used  . Tobacco comment: 01/22/2016   "  quit smoking many years ago "  Substance Use Topics  . Alcohol use: No  . Drug use: No    Family History Family History  Problem Relation Age of Onset  . Diabetes Mother   . Cancer Father   . Breast cancer Neg Hx   . Kidney disease Neg Hx     No family history of bleeding or clotting disorders, autoimmune disease or porphyria  Allergies  Allergen Reactions  . Glipizide  Er     Dropped blood sugar too much  . Percocet [Oxycodone-Acetaminophen] Hives     REVIEW OF SYSTEMS (Negative unless checked)  Constitutional: [] Weight loss  [] Fever  [] Chills Cardiac: [] Chest pain   [] Chest pressure   [] Palpitations   [] Shortness of breath when laying flat   [] Shortness of breath at rest   [x] Shortness of breath with exertion. Vascular:  [] Pain in legs with walking   [] Pain in legs at rest   [] Pain in legs when laying flat   [] Claudication   [] Pain in feet when walking  [] Pain in feet at rest  [] Pain in feet when laying flat   [] History of DVT   [] Phlebitis   [x] Swelling in legs   [] Varicose veins   [] Non-healing ulcers Pulmonary:   [] Uses home oxygen   [] Productive cough   [] Hemoptysis   [] Wheeze  [x] COPD   [] Asthma Neurologic:  [] Dizziness  [] Blackouts   [] Seizures   [] History of stroke   [] History of TIA  [] Aphasia   [] Temporary blindness   [] Dysphagia    [] Weakness or numbness in arms   [] Weakness or numbness in legs Musculoskeletal:  [x] Arthritis   [] Joint swelling   [] Joint pain   [] Low back pain Hematologic:  [] Easy bruising  [] Easy bleeding   [] Hypercoagulable state   [x] Anemic  [] Hepatitis Gastrointestinal:  [] Blood in stool   [] Vomiting blood  [] Gastroesophageal reflux/heartburn   [] Difficulty swallowing. Genitourinary:  [x] Chronic kidney disease   [x] Difficult urination  [] Frequent urination  [] Burning with urination   [] Blood in urine Skin:  [] Rashes   [] Ulcers   [] Wounds Psychological:  [] History of anxiety   []  History of major depression.  Physical Examination  Vitals:   03/14/18 1213  BP: 139/73  Pulse: 60  Resp: 19  Temp: 97.9 F (36.6 C)  TempSrc: Oral  SpO2: 90%  Weight: 84.5 kg  Height: 5' 6"  (1.676 m)   Body mass index is 30.07 kg/m. Gen: WD/WN, NAD Head: /AT, No temporalis wasting.  Ear/Nose/Throat: Hearing grossly intact, nares w/o erythema or drainage, oropharynx w/o Erythema/Exudate,  Eyes: Conjunctiva clear, sclera non-icteric Neck: Trachea midline.  No JVD.  Pulmonary:  Good air movement, respirations not labored, no use of accessory muscles.  Cardiac: RRR, normal S1, S2. Vascular: not thrill in left arm AVG Vessel Right Left  Radial Palpable Palpable               Musculoskeletal: M/S 5/5 throughout.  Extremities without ischemic changes.  No deformity or atrophy.  Neurologic: Sensation grossly intact in extremities.  Symmetrical.  Speech is fluent. Motor exam as listed above. Psychiatric: Judgment intact, Mood & affect appropriate for pt's clinical situation. Dermatologic: No rashes or ulcers noted.  No cellulitis or open wounds.    CBC Lab Results  Component Value Date   WBC 4.9 01/30/2018   HGB 10.3 (L) 01/30/2018   HCT 30.3 (L) 01/30/2018   MCV 100.1 (H) 01/30/2018   PLT 158 01/30/2018    BMET    Component Value Date/Time   NA 139 01/30/2018 2003   NA 138 11/04/2016 1453   NA  138 08/07/2012 0433   K 3.5 01/30/2018 2003   K 3.2 (L) 10/30/2014 1410   CL 104 01/30/2018 2003   CL 104 08/07/2012 0433   CO2 23 01/30/2018 2003   CO2 25 08/07/2012 0433   GLUCOSE 125 (H) 01/30/2018 2003   GLUCOSE 172 (H) 08/07/2012 0433   BUN 44 (H) 01/30/2018 2003   BUN 69 (H) 11/04/2016 1453  BUN 63 (H) 08/07/2012 0433   CREATININE 10.06 (H) 01/30/2018 2003   CREATININE 2.48 (H) 08/07/2012 0433   CALCIUM 7.7 (L) 01/30/2018 2003   CALCIUM 9.1 08/07/2012 0433   GFRNONAA 3 (L) 01/30/2018 2003   GFRNONAA 19 (L) 08/07/2012 0433   GFRAA 4 (L) 01/30/2018 2003   GFRAA 22 (L) 08/07/2012 0433   CrCl cannot be calculated (Patient's most recent lab result is older than the maximum 21 days allowed.).  COAG Lab Results  Component Value Date   INR 1.08 02/08/2017   INR 1.23 01/25/2016   INR 1.38 01/24/2016    Radiology No results found.  Assessment/Plan 1.  Complication dialysis device with thrombosis AV access:  Patient's left arm dialysis access is thrombosed. The patient will undergo thrombectomy using interventional techniques.  The risks and benefits were described to the patient.  All questions were answered.  The patient agrees to proceed with angiography and intervention. Potassium will be drawn to ensure that it is an appropriate level prior to performing thrombectomy. 2.  End-stage renal disease requiring hemodialysis:  Patient will continue dialysis therapy without further interruption if a successful thrombectomy is not achieved then catheter will be placed. Dialysis has already been arranged since the patient missed their previous session 3.  Hypertension:  Patient will continue medical management; nephrology is following no changes in oral medications. 4. Diabetes mellitus:  Glucose will be monitored and oral medications been held this morning once the patient has undergone the patient's procedure po intake will be reinitiated and again Accu-Cheks will be used to assess the  blood glucose level and treat as needed. The patient will be restarted on the patient's usual hypoglycemic regime     Leotis Pain, MD  03/14/2018 12:24 PM

## 2018-03-14 NOTE — Progress Notes (Signed)
Pt BP not improved after labetalol 10 mg IVP. Dr. Lucky Cowboy paged to update. Hydralazine ordered x 1 10 mg IVP. Per MD ok with SBP 180-190 for d/c. Will monitor closely.

## 2018-03-15 ENCOUNTER — Encounter: Payer: Self-pay | Admitting: Vascular Surgery

## 2018-03-16 ENCOUNTER — Encounter
Admission: RE | Disposition: A | Discharge status: Home / Self Care | Payer: Self-pay | Source: Ambulatory Visit | Attending: Vascular Surgery

## 2018-03-16 ENCOUNTER — Ambulatory Visit
Admission: RE | Admit: 2018-03-16 | Discharge: 2018-03-16 | Disposition: A | Discharge status: Home / Self Care | Payer: Medicare Other | Source: Ambulatory Visit | Attending: Vascular Surgery | Admitting: Vascular Surgery

## 2018-03-16 ENCOUNTER — Other Ambulatory Visit (INDEPENDENT_AMBULATORY_CARE_PROVIDER_SITE_OTHER): Payer: Self-pay | Admitting: Nurse Practitioner

## 2018-03-16 DIAGNOSIS — Z960 Presence of urogenital implants: Secondary | ICD-10-CM | POA: Insufficient documentation

## 2018-03-16 DIAGNOSIS — M1990 Unspecified osteoarthritis, unspecified site: Secondary | ICD-10-CM | POA: Diagnosis not present

## 2018-03-16 DIAGNOSIS — Z885 Allergy status to narcotic agent status: Secondary | ICD-10-CM | POA: Diagnosis not present

## 2018-03-16 DIAGNOSIS — I2510 Atherosclerotic heart disease of native coronary artery without angina pectoris: Secondary | ICD-10-CM | POA: Insufficient documentation

## 2018-03-16 DIAGNOSIS — Z87891 Personal history of nicotine dependence: Secondary | ICD-10-CM | POA: Diagnosis not present

## 2018-03-16 DIAGNOSIS — Z9221 Personal history of antineoplastic chemotherapy: Secondary | ICD-10-CM | POA: Diagnosis not present

## 2018-03-16 DIAGNOSIS — J449 Chronic obstructive pulmonary disease, unspecified: Secondary | ICD-10-CM | POA: Diagnosis not present

## 2018-03-16 DIAGNOSIS — Z9981 Dependence on supplemental oxygen: Secondary | ICD-10-CM | POA: Insufficient documentation

## 2018-03-16 DIAGNOSIS — Z992 Dependence on renal dialysis: Secondary | ICD-10-CM | POA: Diagnosis not present

## 2018-03-16 DIAGNOSIS — Z90710 Acquired absence of both cervix and uterus: Secondary | ICD-10-CM | POA: Diagnosis not present

## 2018-03-16 DIAGNOSIS — Z9889 Other specified postprocedural states: Secondary | ICD-10-CM | POA: Diagnosis not present

## 2018-03-16 DIAGNOSIS — I120 Hypertensive chronic kidney disease with stage 5 chronic kidney disease or end stage renal disease: Secondary | ICD-10-CM | POA: Diagnosis not present

## 2018-03-16 DIAGNOSIS — K219 Gastro-esophageal reflux disease without esophagitis: Secondary | ICD-10-CM | POA: Insufficient documentation

## 2018-03-16 DIAGNOSIS — Z888 Allergy status to other drugs, medicaments and biological substances status: Secondary | ICD-10-CM | POA: Insufficient documentation

## 2018-03-16 DIAGNOSIS — T82868A Thrombosis of vascular prosthetic devices, implants and grafts, initial encounter: Secondary | ICD-10-CM | POA: Diagnosis not present

## 2018-03-16 DIAGNOSIS — N186 End stage renal disease: Secondary | ICD-10-CM | POA: Insufficient documentation

## 2018-03-16 DIAGNOSIS — G4730 Sleep apnea, unspecified: Secondary | ICD-10-CM | POA: Insufficient documentation

## 2018-03-16 DIAGNOSIS — Z833 Family history of diabetes mellitus: Secondary | ICD-10-CM | POA: Insufficient documentation

## 2018-03-16 DIAGNOSIS — Z8551 Personal history of malignant neoplasm of bladder: Secondary | ICD-10-CM | POA: Insufficient documentation

## 2018-03-16 DIAGNOSIS — E1140 Type 2 diabetes mellitus with diabetic neuropathy, unspecified: Secondary | ICD-10-CM | POA: Insufficient documentation

## 2018-03-16 DIAGNOSIS — Z809 Family history of malignant neoplasm, unspecified: Secondary | ICD-10-CM | POA: Diagnosis not present

## 2018-03-16 DIAGNOSIS — E1122 Type 2 diabetes mellitus with diabetic chronic kidney disease: Secondary | ICD-10-CM | POA: Insufficient documentation

## 2018-03-16 DIAGNOSIS — Y832 Surgical operation with anastomosis, bypass or graft as the cause of abnormal reaction of the patient, or of later complication, without mention of misadventure at the time of the procedure: Secondary | ICD-10-CM | POA: Diagnosis not present

## 2018-03-16 DIAGNOSIS — Z923 Personal history of irradiation: Secondary | ICD-10-CM | POA: Insufficient documentation

## 2018-03-16 HISTORY — PX: PERIPHERAL VASCULAR THROMBECTOMY: CATH118306

## 2018-03-16 LAB — POTASSIUM (ARMC VASCULAR LAB ONLY): Potassium (ARMC vascular lab): 4.8 (ref 3.5–5.1)

## 2018-03-16 LAB — GLUCOSE, CAPILLARY: GLUCOSE-CAPILLARY: 76 mg/dL (ref 70–99)

## 2018-03-16 SURGERY — PERIPHERAL VASCULAR THROMBECTOMY
Anesthesia: Moderate Sedation | Laterality: Left

## 2018-03-16 MED ORDER — FENTANYL CITRATE (PF) 100 MCG/2ML IJ SOLN
INTRAMUSCULAR | Status: DC | PRN
  Administered 2018-03-16: 50 ug via INTRAVENOUS

## 2018-03-16 MED ORDER — MIDAZOLAM HCL 5 MG/5ML IJ SOLN
INTRAMUSCULAR | Status: AC
  Filled 2018-03-16: qty 5

## 2018-03-16 MED ORDER — HYDROMORPHONE HCL 1 MG/ML IJ SOLN: 1.0000 mg | Freq: Once | INTRAMUSCULAR | Status: DC | PRN

## 2018-03-16 MED ORDER — SODIUM CHLORIDE FLUSH 0.9 % IV SOLN
INTRAVENOUS | Status: AC
  Filled 2018-03-16: qty 10

## 2018-03-16 MED ORDER — LIDOCAINE HCL (PF) 1 % IJ SOLN
INTRAMUSCULAR | Status: AC
  Filled 2018-03-16: qty 30

## 2018-03-16 MED ORDER — FENTANYL CITRATE (PF) 100 MCG/2ML IJ SOLN
INTRAMUSCULAR | Status: AC
  Filled 2018-03-16: qty 2

## 2018-03-16 MED ORDER — HEPARIN SOD (PORK) LOCK FLUSH 100 UNIT/ML IV SOLN
INTRAVENOUS | Status: AC
  Administered 2018-03-16: 500 [IU]
  Filled 2018-03-16: qty 5

## 2018-03-16 MED ORDER — HEPARIN SODIUM (PORCINE) 1000 UNIT/ML IJ SOLN
INTRAMUSCULAR | Status: AC
  Filled 2018-03-16: qty 1

## 2018-03-16 MED ORDER — HEPARIN (PORCINE) IN NACL 1000-0.9 UT/500ML-% IV SOLN
INTRAVENOUS | Status: AC
  Filled 2018-03-16: qty 1000

## 2018-03-16 MED ORDER — MIDAZOLAM HCL 2 MG/2ML IJ SOLN
INTRAMUSCULAR | Status: DC | PRN
  Administered 2018-03-16: 2 mg via INTRAVENOUS

## 2018-03-16 MED ORDER — CEFAZOLIN SODIUM-DEXTROSE 1-4 GM/50ML-% IV SOLN
1.0000 g | Freq: Once | INTRAVENOUS | Status: AC
  Administered 2018-03-16: 1 g via INTRAVENOUS

## 2018-03-16 MED ORDER — IOPAMIDOL (ISOVUE-300) INJECTION 61%
INTRAVENOUS | Status: DC | PRN
  Administered 2018-03-16: 65 mL via INTRA_ARTERIAL

## 2018-03-16 MED ORDER — CEFAZOLIN SODIUM-DEXTROSE 1-4 GM/50ML-% IV SOLN
INTRAVENOUS | Status: AC
  Administered 2018-03-16: 1 g via INTRAVENOUS
  Filled 2018-03-16: qty 50

## 2018-03-16 MED ORDER — SODIUM CHLORIDE 0.9 % IV SOLN
INTRAVENOUS | Status: DC
  Administered 2018-03-16: 13:00:00 via INTRAVENOUS

## 2018-03-16 MED ORDER — HEPARIN SODIUM (PORCINE) 1000 UNIT/ML IJ SOLN
INTRAMUSCULAR | Status: DC | PRN
  Administered 2018-03-16: 4000 [IU] via INTRAVENOUS

## 2018-03-16 MED ORDER — ONDANSETRON HCL 4 MG/2ML IJ SOLN: 4.0000 mg | Freq: Four times a day (QID) | INTRAMUSCULAR | Status: DC | PRN

## 2018-03-16 SURGICAL SUPPLY — 21 items
BALLN ATG 12X6X80 (BALLOONS) ×2
BALLN ATG 14X6X80 (BALLOONS) ×2
BALLN DORADO 10X40X80 (BALLOONS) ×2
BALLN LUTONIX AV 10X60X75 (BALLOONS) ×4
BALLOON ATG 12X6X80 (BALLOONS) ×1 IMPLANT
BALLOON ATG 14X6X80 (BALLOONS) ×1 IMPLANT
BALLOON DORADO 10X40X80 (BALLOONS) ×1 IMPLANT
BALLOON LUTONIX AV 10X60X75 (BALLOONS) ×2 IMPLANT
CATH BEACON 5 .035 65 KMP TIP (CATHETERS) ×2 IMPLANT
CATH PIG 70CM (CATHETERS) ×2 IMPLANT
DEVICE PRESTO INFLATION (MISCELLANEOUS) ×2 IMPLANT
DEVICE TORQUE .025-.038 (MISCELLANEOUS) ×2 IMPLANT
GUIDEWIRE ANGLED .035 180CM (WIRE) ×2 IMPLANT
KIT THROMB PERC PTD (MISCELLANEOUS) ×2 IMPLANT
NEEDLE ENTRY 21GA 7CM ECHOTIP (NEEDLE) ×2 IMPLANT
PACK ANGIOGRAPHY (CUSTOM PROCEDURE TRAY) ×2 IMPLANT
SET INTRO CAPELLA COAXIAL (SET/KITS/TRAYS/PACK) ×2 IMPLANT
SHEATH BRITE TIP 6FRX5.5 (SHEATH) ×4 IMPLANT
SHEATH BRITE TIP 7FRX5.5 (SHEATH) ×2 IMPLANT
SUT MNCRL AB 4-0 PS2 18 (SUTURE) ×2 IMPLANT
WIRE MAGIC TOR.035 180C (WIRE) ×2 IMPLANT

## 2018-03-16 NOTE — Op Note (Signed)
OPERATIVE NOTE   PROCEDURE: 1. Contrast injection left arm brachial axillary AV graft 2. Mechanical thrombectomy left arm brachial axillary AV graft 3. Percutaneous transluminal antroplasty to 14 mm left innominate vein 4. Percutaneous transluminal angioplasty to 10 mm left venous outflow  PRE-OPERATIVE DIAGNOSIS: Complication of dialysis access                                                       End Stage Renal Disease  POST-OPERATIVE DIAGNOSIS: same as above   SURGEON: Katha Cabal, M.D.  ANESTHESIA: Conscious sedation was administered by the radiology RN under my direct supervision. IV Versed plus fentanyl were utilized. Continuous ECG, pulse oximetry and blood pressure was monitored throughout the entire procedure. Conscious sedation was for a total of 57 minutes.    ESTIMATED BLOOD LOSS: minimal  FINDING(S): 1. 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  Greater than 80% stenosis of the venous outflow thrombosis within the graft itself  SPECIMEN(S):  None  CONTRAST: 65 cc  FLUOROSCOPY TIME: 7.9 minutes  INDICATIONS: Barbara [name] is a 77 y.o. female who  presents with thrombosed AV access.  The patient is scheduled for angiography with possible intervention of the AV access.  The patient is aware the risks include but are not limited to: bleeding, infection, thrombosis of the cannulated access, and possible anaphylactic reaction to the contrast.  The patient acknowledges if the access can not be salvaged a tunneled catheter will be needed and will be placed during this procedure.  The patient is aware of the risks of the procedure and elects to proceed with the angiogram and intervention.  DESCRIPTION: After full informed written consent was obtained, the patient was brought back to the Special Procedure suite and placed supine position.  Appropriate cardiopulmonary monitors were placed.  The left arm was prepped and draped in the standard  fashion.  Appropriate timeout is called. The left brachial axillary AV graft was cannulated with a micropuncture needle using ultrasound guidance.  With the ultrasound the AV access appeared to be filled with heterogeneous material and was poorly compressible indicating thrombosis of the AV access. The puncture was made under direct ultrasound visualization and an image was recorded for the permanent record.  The microwire was advanced and the needle was exchanged for  a microsheath.  The J-wire was then advanced and a 6 Fr sheath inserted.  Hand was then performed which demonstrated thrombus within the AV access.  The central venous structures were also imaged by hand injections.  3000 units of heparin was given and allowed to circulate as well.  After the thrombus grafted been cleared the graft itself appeared to be in relatively good condition.  The arterial inflow was adequate.  There is a 80% stenosis at the venous outflow.  Previously stented innominate vein is patent however more proximal to the stent the innominate vein has a 70% stenosis.  A Trerotola device was then advanced beginning centrally and pulling back performing.  Several passes were made through the venous portion of the graft. Follow-up imaging now demonstrates the vast majority of the clot had been treated. Therefore a retrograde sheath was inserted. This was a 6 [place] sheath and was positioned more proximally on the arm and angled in the retrograde direction. The left brachial axillary was cannulated  with a micropuncture needle using ultrasound guidance.  With the ultrasound the AV access appeared to be filled with heterogeneous material and was poorly compressible indicating thrombosis of the AV access. The puncture was made under direct ultrasound visualization and an image was recorded for the permanent record.  Subsequently a floppy Glidewire and a KMP catheter were negotiated into the arterial system hand injection contrast was  then utilized to demonstrate patency of the artery as well as the location for the anastomosis. The Trerotola device was now advanced through the retrograde sheath was extended out into the artery the basket was opened and it was slowly pulled back into the graft and then the basket was engaged. Several passes were made on the arterial portion and pulsatility of the access was reestablished. Follow-up imaging demonstrates there was now thrombus in the venous portion surrounding the sheath and this was treated with the Trerotola device from the antegrade direction. After several passes imaging demonstrated resolution of thrombus within the graft and forward flow however stricture of the graft was noted area  Magic torque wire was then advanced through the antegrade sheath and an 10 x 6 Dorado balloon was used to angioplasty the venous portion of the AV access.  Also a 10 mm Lutonix drug-eluting balloon was used in this location.  Multiple inflations were performed inflation times with 30 seconds to 1 minute with maximum pressures of 20 ATM.  Follow-up imaging demonstrated less than 10% residual stenosis.  The innominate stenosis was treated initially with a 10 mm Dorado inflation to 20 atm for 1 minute.  Then a 10 mm Lutonix drug-eluting balloon was utilized for 1 minute.  Following this a 14 mm Atlas balloon was used to angioplasty this lesion.  Follow-up imaging demonstrated less than 20% residual stenosis.  With the balloon inflated reflux of contrast was performed demonstrating the arterial anastomosis. The arterial anastomosis was patent. Contrast was then injected in the forward direction demonstrating rapid flow.  A 4-0 Monocryl purse-string suture was sewn around both of the sheaths.  The sheaths were removed and light pressure was applied.  A sterile bandage was applied to the puncture site.    COMPLICATIONS: None  CONDITION: Barbara [name], M.D [facility] Office:  [phone]  03/16/2018 2:59 PM

## 2018-03-16 NOTE — H&P (Signed)
[facility] History & Physical Update  The patient was interviewed and re-examined.  The patient's previous History and Physical has been reviewed and is unchanged.  There is no change in the plan of care. We plan to proceed with the scheduled procedure.  [name], MD  03/16/2018, 1:46 PM

## 2018-03-18 ENCOUNTER — Encounter: Payer: Self-pay | Admitting: Vascular Surgery

## 2018-03-31 ENCOUNTER — Encounter (INDEPENDENT_AMBULATORY_CARE_PROVIDER_SITE_OTHER): Payer: Self-pay | Admitting: Vascular Surgery

## 2018-03-31 ENCOUNTER — Ambulatory Visit (INDEPENDENT_AMBULATORY_CARE_PROVIDER_SITE_OTHER): Payer: Medicare Other | Admitting: Vascular Surgery

## 2018-03-31 ENCOUNTER — Other Ambulatory Visit (INDEPENDENT_AMBULATORY_CARE_PROVIDER_SITE_OTHER): Payer: Self-pay | Admitting: Vascular Surgery

## 2018-03-31 ENCOUNTER — Ambulatory Visit (INDEPENDENT_AMBULATORY_CARE_PROVIDER_SITE_OTHER): Payer: Medicare Other

## 2018-03-31 VITALS — BP 120/66 | HR 65 | Resp 16 | Ht 68.0 in | Wt 189.0 lb

## 2018-03-31 DIAGNOSIS — I10 Essential (primary) hypertension: Secondary | ICD-10-CM

## 2018-03-31 DIAGNOSIS — T829XXS Unspecified complication of cardiac and vascular prosthetic device, implant and graft, sequela: Secondary | ICD-10-CM | POA: Diagnosis not present

## 2018-03-31 DIAGNOSIS — N186 End stage renal disease: Secondary | ICD-10-CM

## 2018-03-31 DIAGNOSIS — I700 Atherosclerosis of aorta: Secondary | ICD-10-CM

## 2018-03-31 DIAGNOSIS — T829XXD Unspecified complication of cardiac and vascular prosthetic device, implant and graft, subsequent encounter: Secondary | ICD-10-CM

## 2018-03-31 DIAGNOSIS — I2510 Atherosclerotic heart disease of native coronary artery without angina pectoris: Secondary | ICD-10-CM

## 2018-03-31 NOTE — Progress Notes (Signed)
MRN : [id number]  Barbara [name] is a 77 y.o. (05-04-1941) female who presents with chief complaint of  Chief Complaint  Patient presents with  . Follow-up    2 week HDA, f/u from [facility]  .  History of Present Illness:   Left arm AV access thrombectomy and intervention on 03/16/2018: 1. Contrast injection left arm brachial axillary AV graft 2. Mechanical thrombectomy left arm brachial axillary AV graft 3. Percutaneous transluminal antroplasty to 14 mm left innominate vein 4. Percutaneous transluminal angioplasty to 10 mm left venous outflow  The patient returns to the office for followup status post intervention of the dialysis access. Following the intervention the access function has significantly improved, with better flow rates and improved KT/V. The patient has not been experiencing increased bleeding times following decannulation and the patient denies increased recirculation. The patient denies an increase in arm swelling. At the present time the patient denies hand pain.  The patient denies amaurosis fugax or recent TIA symptoms. There are no recent neurological changes noted. The patient denies claudication symptoms or rest pain symptoms. The patient denies history of DVT, PE or superficial thrombophlebitis. The patient denies recent episodes of angina or shortness of breath.   Duplex ultrasound of the AV access shows a patent access.  The previously noted stenosis is unchanged compared to last study.       Current Meds  Medication Sig  . ACCU-CHEK AVIVA PLUS test strip 1 each by Other route daily. use as directed  . ACCU-CHEK SOFTCLIX LANCETS lancets INJECT 1 EACH AS DIRECTED ONCE DAILY.  Marland Kitchen acetaminophen (TYLENOL) 500 MG tablet Take 1,000 mg by mouth 2 (two) times daily as needed for moderate pain or headache.   . Albuterol Sulfate (PROAIR RESPICLICK) 542 (90 Base) MCG/ACT AEPB Inhale 2 puffs into the lungs 4 (four) times daily as needed (wheezing).   Marland Kitchen atorvastatin  (LIPITOR) 40 MG tablet Take 40 mg by mouth daily.   . Blood Glucose Monitoring Suppl (ACCU-CHEK AVIVA PLUS) w/Device KIT (USE TO MONITOR BLOOD SUGARS.)  . calcium acetate (PHOSLO) 667 MG capsule Take 2 capsules (1,334 mg total) by mouth 3 (three) times daily with meals.  . cholecalciferol (VITAMIN D) 1000 units tablet Take 1,000 Units by mouth daily.  Marland Kitchen esomeprazole (NEXIUM) 40 MG capsule Take 40 mg by mouth daily after breakfast.   . ferrous sulfate 325 (65 FE) MG EC tablet Take 325 mg by mouth daily after breakfast.   . furosemide (LASIX) 40 MG tablet Take 40 mg by mouth 2 (two) times daily. Take morning dose every day. Lunch dose as needed.  . gabapentin (NEURONTIN) 300 MG capsule Take 600 mg by mouth 3 (three) times daily.  Marland Kitchen lidocaine (XYLOCAINE) 5 % ointment Apply 1 application topically 2 (two) times daily as needed for mild pain.   Marland Kitchen lidocaine-prilocaine (EMLA) cream APPLY TO AFFECTED AREA ONCE  . NON FORMULARY Place 1 Units into the nose at bedtime. CPAP Time of use 2100-0600  . oxybutynin (DITROPAN) 5 MG tablet Take 5 mg by mouth 3 (three) times daily.  . OXYGEN Inhale 2 L into the lungs at bedtime.    Past Medical History:  Diagnosis Date  . Adult celiac disease   . Anemia associated with chronic renal failure 2017   blood transfusion last week 10/17  . Arthritis   . Asthma   . Cancer ([place]) 2017   bladder  . Chronic kidney disease   . CKD (chronic kidney disease)  stage IV kidney disease.  dr. Candiss Norse and dr. Holley Raring follow her  . COPD (chronic obstructive pulmonary disease) ([place])   . Coronary artery disease   . Diabetes mellitus without complication ([place])   . Dialysis patient ([place])    Tues, Thurs, Sat  . Dyspnea    with exertion  . Elevated lipids   . GERD (gastroesophageal reflux disease)   . Hematuria   . Hypertension   . Lower back pain   . Neuropathy   . Oxygen dependent    at hs  . Personal history of chemotherapy 2017   bladder ca  . Personal history of  radiation therapy 2017   bladder ca  . Sleep apnea    uses cpap  . Urinary obstruction 01/2016    Past Surgical History:  Procedure Laterality Date  . ABDOMINAL HYSTERECTOMY    . AV FISTULA PLACEMENT Left 02/17/2017   Procedure: INSERTION OF ARTERIOVENOUS (AV) GORE-TEX GRAFT ARM(BRACHIAL AXILLARY);  Surgeon: Katha Cabal, MD;  Location: [facility];  Service: Vascular;  Laterality: Left;  . CYSTOSCOPY W/ RETROGRADES Bilateral 02/17/2016   Procedure: CYSTOSCOPY WITH RETROGRADE PYELOGRAM;  Surgeon: Hollice Espy, MD;  Location: [facility];  Service: Urology;  Laterality: Bilateral;  . CYSTOSCOPY W/ RETROGRADES Bilateral 10/12/2016   Procedure: CYSTOSCOPY WITH RETROGRADE PYELOGRAM;  Surgeon: Hollice Espy, MD;  Location: [facility];  Service: Urology;  Laterality: Bilateral;  . CYSTOSCOPY W/ RETROGRADES Bilateral 06/09/2017   Procedure: CYSTOSCOPY WITH RETROGRADE PYELOGRAM;  Surgeon: Hollice Espy, MD;  Location: [facility];  Service: Urology;  Laterality: Bilateral;  . CYSTOSCOPY W/ RETROGRADES Bilateral 10/07/2017   Procedure: CYSTOSCOPY WITH RETROGRADE PYELOGRAM;  Surgeon: Hollice Espy, MD;  Location: [facility];  Service: Urology;  Laterality: Bilateral;  . CYSTOSCOPY W/ URETERAL STENT PLACEMENT Left 05/12/2016   Procedure: CYSTOSCOPY WITH STENT REPLACEMENT;  Surgeon: Hollice Espy, MD;  Location: [facility];  Service: Urology;  Laterality: Left;  . CYSTOSCOPY W/ URETERAL STENT PLACEMENT Left 10/12/2016   Procedure: CYSTOSCOPY WITH STENT REPLACEMENT;  Surgeon: Hollice Espy, MD;  Location: [facility];  Service: Urology;  Laterality: Left;  . CYSTOSCOPY WITH BIOPSY N/A 06/09/2017   Procedure: CYSTOSCOPY WITH BLADDER BIOPSY;  Surgeon: Hollice Espy, MD;  Location: [facility];  Service: Urology;  Laterality: N/A;  . CYSTOSCOPY WITH STENT PLACEMENT Left 01/21/2016   Procedure: CYSTOSCOPY WITH double J STENT PLACEMENT;  Surgeon: Franchot Gallo, MD;  Location: [facility];  Service: Urology;   Laterality: Left;  . CYSTOSCOPY WITH STENT PLACEMENT Right 10/12/2016   Procedure: CYSTOSCOPY WITH STENT PLACEMENT;  Surgeon: Hollice Espy, MD;  Location: [facility];  Service: Urology;  Laterality: Right;  . DIALYSIS/PERMA CATHETER INSERTION N/A 11/20/2016   Procedure: Dialysis/Perma Catheter Insertion;  Surgeon: Algernon Huxley, MD;  Location: [place] [facility];  Service: Cardiovascular;  Laterality: N/A;  . DIALYSIS/PERMA CATHETER REMOVAL N/A 03/30/2017   Procedure: DIALYSIS/PERMA CATHETER REMOVAL;  Surgeon: Katha Cabal, MD;  Location: [place] [facility];  Service: Cardiovascular;  Laterality: N/A;  . KIDNEY SURGERY  01/21/2016   IR NEPHROSTOMY PLACEMENT LEFT   . PERIPHERAL VASCULAR CATHETERIZATION N/A 04/07/2016   Procedure: Glori Luis Cath Insertion;  Surgeon: Katha Cabal, MD;  Location: [place] [facility];  Service: Cardiovascular;  Laterality: N/A;  . PERIPHERAL VASCULAR THROMBECTOMY Left 06/02/2017   Procedure: PERIPHERAL VASCULAR THROMBECTOMY;  Surgeon: Algernon Huxley, MD;  Location: [place] [facility];  Service: Cardiovascular;  Laterality: Left;  . PERIPHERAL VASCULAR THROMBECTOMY Left 01/28/2018   Procedure: PERIPHERAL VASCULAR  THROMBECTOMY;  Surgeon: Katha Cabal, MD;  Location: [place] [facility];  Service: Cardiovascular;  Laterality: Left;  . PERIPHERAL VASCULAR THROMBECTOMY Left 03/14/2018   Procedure: PERIPHERAL VASCULAR THROMBECTOMY;  Surgeon: Algernon Huxley, MD;  Location: [place] [facility];  Service: Cardiovascular;  Laterality: Left;  . PERIPHERAL VASCULAR THROMBECTOMY Left 03/16/2018   Procedure: PERIPHERAL VASCULAR THROMBECTOMY;  Surgeon: Katha Cabal, MD;  Location: [place] [facility];  Service: Cardiovascular;  Laterality: Left;  . PORTACATH PLACEMENT Right   . ROTATOR CUFF REPAIR Left   . TRANSURETHRAL RESECTION OF BLADDER TUMOR N/A 02/17/2016   Procedure: TRANSURETHRAL RESECTION OF BLADDER TUMOR (TURBT)-LARGE;  Surgeon: Hollice Espy, MD;   Location: [facility];  Service: Urology;  Laterality: N/A;  . TRANSURETHRAL RESECTION OF BLADDER TUMOR N/A 10/07/2017   Procedure: TRANSURETHRAL RESECTION OF BLADDER TUMOR (TURBT)-small;  Surgeon: Hollice Espy, MD;  Location: [facility];  Service: Urology;  Laterality: N/A;  . URETEROSCOPY Left 02/17/2016   Procedure: URETEROSCOPY;  Surgeon: Hollice Espy, MD;  Location: [facility];  Service: Urology;  Laterality: Left;  . URETEROSCOPY Right 10/12/2016   Procedure: URETEROSCOPY;  Surgeon: Hollice Espy, MD;  Location: [facility];  Service: Urology;  Laterality: Right;    Social History Social History   Tobacco Use  . Smoking status: Former Smoker    Types: Cigarettes    Last attempt to quit: 07/20/2002    Years since quitting: 15.7  . Smokeless tobacco: Never Used  . Tobacco comment: 01/22/2016   "  quit smoking many years ago "  Substance Use Topics  . Alcohol use: No  . Drug use: No    Family History Family History  Problem Relation Age of Onset  . Diabetes Mother   . Cancer Father   . Breast cancer Neg Hx   . Kidney disease Neg Hx     Allergies  Allergen Reactions  . Glipizide Er     Dropped blood sugar too much  . Percocet [Oxycodone-Acetaminophen] Hives     REVIEW OF SYSTEMS (Negative unless checked)  Constitutional: [] Weight loss  [] Fever  [] Chills Cardiac: [] Chest pain   [] Chest pressure   [] Palpitations   [] Shortness of breath when laying flat   [] Shortness of breath with exertion. Vascular:  [] Pain in legs with walking   [] Pain in legs at rest  [] History of DVT   [] Phlebitis   [] Swelling in legs   [] Varicose veins   [] Non-healing ulcers Pulmonary:   [] Uses home oxygen   [] Productive cough   [] Hemoptysis   [] Wheeze  [] COPD   [] Asthma Neurologic:  [] Dizziness   [] Seizures   [] History of stroke   [] History of TIA  [] Aphasia   [] Vissual changes   [] Weakness or numbness in arm   [] Weakness or numbness in leg Musculoskeletal:   [] Joint swelling   [] Joint pain   [] Low back  pain Hematologic:  [] Easy bruising  [] Easy bleeding   [] Hypercoagulable state   [] Anemic Gastrointestinal:  [] Diarrhea   [] Vomiting  [] Gastroesophageal reflux/heartburn   [] Difficulty swallowing. Genitourinary:  [x] Chronic kidney disease   [] Difficult urination  [] Frequent urination   [] Blood in urine Skin:  [] Rashes   [] Ulcers  Psychological:  [] History of anxiety   []  History of major depression.  Physical Examination  Vitals:   03/31/18 0925  BP: 120/66  Pulse: 65  Resp: 16  Weight: 189 lb (85.7 kg)  Height: 5' 8"  (1.727 m)   Body mass index is 28.74 kg/m. Gen: WD/WN, NAD Head: [place]/AT, No temporalis  wasting.  Ear/Nose/Throat: Hearing grossly intact, nares w/o erythema or drainage Eyes: PER, EOMI, sclera nonicteric.  Neck: Supple, no large masses.   Pulmonary:  Good air movement, no audible wheezing bilaterally, no use of accessory muscles.  Cardiac: RRR, no JVD Vascular: left brachial axillary AV graft good thrill good bruit  Vessel Right Left  Radial Palpable Trace Palpable  Brachial Palpable Palpable  Carotid Palpable Palpable  Gastrointestinal: Non-distended. No guarding/no peritoneal signs.  Musculoskeletal: M/S 5/5 throughout.  No deformity or atrophy.  Neurologic: CN 2-12 intact. Symmetrical.  Speech is fluent. Motor exam as listed above. Psychiatric: Judgment intact, Mood & affect appropriate for pt's clinical situation. Dermatologic: No rashes or ulcers noted.  No changes consistent with cellulitis. Lymph : No lichenification or skin changes of chronic lymphedema.  CBC Lab Results  Component Value Date   WBC 4.9 01/30/2018   HGB 10.3 (L) 01/30/2018   HCT 30.3 (L) 01/30/2018   MCV 100.1 (H) 01/30/2018   PLT 158 01/30/2018    BMET    Component Value Date/Time   NA 139 01/30/2018 2003   NA 138 11/04/2016 1453   NA 138 08/07/2012 0433   K 3.5 01/30/2018 2003   K 3.2 (L) 10/30/2014 1410   CL 104 01/30/2018 2003   CL 104 08/07/2012 0433   CO2 23  01/30/2018 2003   CO2 25 08/07/2012 0433   GLUCOSE 125 (H) 01/30/2018 2003   GLUCOSE 172 (H) 08/07/2012 0433   BUN 44 (H) 01/30/2018 2003   BUN 69 (H) 11/04/2016 1453   BUN 63 (H) 08/07/2012 0433   CREATININE 10.06 (H) 01/30/2018 2003   CREATININE 2.48 (H) 08/07/2012 0433   CALCIUM 7.7 (L) 01/30/2018 2003   CALCIUM 9.1 08/07/2012 0433   GFRNONAA 3 (L) 01/30/2018 2003   GFRNONAA 19 (L) 08/07/2012 0433   GFRAA 4 (L) 01/30/2018 2003   GFRAA 22 (L) 08/07/2012 0433   CrCl cannot be calculated (Patient's most recent lab result is older than the maximum 21 days allowed.).  COAG Lab Results  Component Value Date   INR 1.08 02/08/2017   INR 1.23 01/25/2016   INR 1.38 01/24/2016    Radiology No results found.   Assessment/Plan 1. Complication from renal dialysis device, sequela Recommend:  The patient is doing well and currently has adequate dialysis access. The patient's dialysis center is not reporting any access issues. Flow pattern is stable when compared to the prior ultrasound.  The patient should have a duplex ultrasound of the dialysis access in 3 months.  The patient will follow-up with me in the office after each ultrasound   - VAS [place] (AVF, AVG); Future  2. End stage renal disease ([place]) Continue dialysis without interuption  3. Atherosclerosis of abdominal aorta ([facility])  Recommend:  The patient has evidence of atherosclerosis of the lower extremities with claudication.  The patient does not voice lifestyle limiting changes at this point in time.  Noninvasive studies do not suggest clinically significant change.  No invasive studies, angiography or surgery at this time The patient should continue walking and begin a more formal exercise program.  The patient should continue antiplatelet therapy and aggressive treatment of the lipid abnormalities  No changes in the patient's medications at this time  4. CAD in native artery Continue  cardiac and antihypertensive medications as already ordered and reviewed, no changes at this time.  Continue statin as ordered and reviewed, no changes at this time  Nitrates PRN for chest pain  5. Essential hypertension Continue antihypertensive medications as already ordered, these medications have been reviewed and there are no changes at this time.     [name], MD  03/31/2018 9:32 AM

## 2018-04-12 ENCOUNTER — Ambulatory Visit (INDEPENDENT_AMBULATORY_CARE_PROVIDER_SITE_OTHER): Payer: Medicare Other | Admitting: Vascular Surgery

## 2018-04-18 ENCOUNTER — Encounter: Payer: Self-pay | Admitting: Internal Medicine

## 2018-04-18 ENCOUNTER — Inpatient Hospital Stay: Payer: Medicare Other | Attending: Internal Medicine

## 2018-04-18 ENCOUNTER — Other Ambulatory Visit: Payer: Self-pay

## 2018-04-18 ENCOUNTER — Inpatient Hospital Stay (HOSPITAL_BASED_OUTPATIENT_CLINIC_OR_DEPARTMENT_OTHER): Payer: Medicare Other | Admitting: Internal Medicine

## 2018-04-18 VITALS — BP 153/66 | HR 73 | Temp 97.0°F | Resp 24 | Ht 68.0 in | Wt 192.0 lb

## 2018-04-18 DIAGNOSIS — Z79899 Other long term (current) drug therapy: Secondary | ICD-10-CM | POA: Diagnosis not present

## 2018-04-18 DIAGNOSIS — N186 End stage renal disease: Secondary | ICD-10-CM | POA: Diagnosis not present

## 2018-04-18 DIAGNOSIS — Z87891 Personal history of nicotine dependence: Secondary | ICD-10-CM | POA: Diagnosis not present

## 2018-04-18 DIAGNOSIS — I10 Essential (primary) hypertension: Secondary | ICD-10-CM

## 2018-04-18 DIAGNOSIS — Z992 Dependence on renal dialysis: Secondary | ICD-10-CM | POA: Diagnosis not present

## 2018-04-18 DIAGNOSIS — Z9221 Personal history of antineoplastic chemotherapy: Secondary | ICD-10-CM | POA: Diagnosis not present

## 2018-04-18 DIAGNOSIS — Z923 Personal history of irradiation: Secondary | ICD-10-CM | POA: Diagnosis not present

## 2018-04-18 DIAGNOSIS — C670 Malignant neoplasm of trigone of bladder: Secondary | ICD-10-CM | POA: Insufficient documentation

## 2018-04-18 DIAGNOSIS — J449 Chronic obstructive pulmonary disease, unspecified: Secondary | ICD-10-CM | POA: Diagnosis not present

## 2018-04-18 DIAGNOSIS — Z9981 Dependence on supplemental oxygen: Secondary | ICD-10-CM

## 2018-04-18 DIAGNOSIS — D649 Anemia, unspecified: Secondary | ICD-10-CM

## 2018-04-18 DIAGNOSIS — Z95828 Presence of other vascular implants and grafts: Secondary | ICD-10-CM

## 2018-04-18 MED ORDER — SODIUM CHLORIDE 0.9% FLUSH
10.0000 mL | Freq: Once | INTRAVENOUS | Status: AC
  Administered 2018-04-18: 10 mL via INTRAVENOUS
  Filled 2018-04-18: qty 10

## 2018-04-18 MED ORDER — HEPARIN SOD (PORK) LOCK FLUSH 100 UNIT/ML IV SOLN
500.0000 [IU] | Freq: Once | INTRAVENOUS | Status: AC
  Administered 2018-04-18: 500 [IU] via INTRAVENOUS

## 2018-04-18 NOTE — Assessment & Plan Note (Addendum)
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  Stable.  # Last PET scan July 2019-  was negative for metastatic disease.  June 2019- cysto-NEG.   #anemia/hemoglobin 10.4; continue p.o. Iron; STABLE.   # End-stage renal disease on dialysis [Dr.Singh]; STABLE.   # port flush every 2 months.   # follow up in 4 month/port flush/labs- PET scan prior- Dr.B

## 2018-04-18 NOTE — Progress Notes (Signed)
[facility] OFFICE PROGRESS NOTE  Patient Care Team: [name], MD as PCP - General (Internal Medicine)  Cancer Staging No matching staging information was found for the patient.   Oncology History    #AUG 2017 TURBT- [Dr.Brandon] Large nodular proximally 5 cm bladder mass involving the left hemitrigone extending up the left lateral bladder wall, 2 discrete nodules with bridge of abnormal tissue; Tumor clinically T2. TURBT- Tumor invades muscularis propria [detrusor muscle]. NOT candidate for cystectomy.  # SEP 2017- RT- GEM weekly x2 [gem discontinued sec to poor tolerance]; finished RT dec 2017; Nov 2018- cysto- NEG path [Dr.Brandon]  # ESRD on HD-Dr.Singh / C.diff [aug 2017]  DIAGNOSIS: Muscle invasive bladder ca  STAGE:  II       ;GOALS: curative  CURRENT/MOST RECENT THERAPY : surveillaince      Cancer of trigone of urinary bladder ([place])       INTERVAL HISTORY:  Barbara [name] 77 y.o.  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   Patient is currently on dialysis.  Patient was recently admitted the hospital for fistula malfunction.  This had to be revised.  Chronic mild fatigue.  Not any worse.  No unusual shortness of breath or cough.  No fever.    Review of Systems  Constitutional: Positive for malaise/fatigue. Negative for chills, diaphoresis, fever and weight loss.  HENT: Negative for nosebleeds and sore throat.   Eyes: Negative for double vision.  Respiratory: Negative for cough, hemoptysis, sputum production, shortness of breath and wheezing.   Cardiovascular: Positive for leg swelling. Negative for chest pain, palpitations and orthopnea.  Gastrointestinal: Negative for abdominal pain, blood in stool, constipation, diarrhea, heartburn, melena, nausea and vomiting.  Genitourinary: Negative for dysuria,  frequency and urgency.  Musculoskeletal: Negative for back pain and joint pain.  Skin: Negative.  Negative for itching and rash.  Neurological: Negative for dizziness, tingling, focal weakness, weakness and headaches.  Endo/Heme/Allergies: Does not bruise/bleed easily.  Psychiatric/Behavioral: Negative for depression. The patient is not nervous/anxious and does not have insomnia.      PAST MEDICAL HISTORY :  Past Medical History:  Diagnosis Date  . Adult celiac disease   . Anemia associated with chronic renal failure 2017   blood transfusion last week 10/17  . Arthritis   . Asthma   . Cancer ([place]) 2017   bladder  . Chronic kidney disease   . 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  dr. Candiss Norse and dr. Holley Raring follow her  . COPD (chronic obstructive pulmonary disease) ([place])   . Coronary artery disease   . Diabetes mellitus without complication ([place])   . Dialysis patient ([facility])    Tues, Thurs, Sat  . Dyspnea    with exertion  . Elevated lipids   . GERD (gastroesophageal reflux disease)   . Hematuria   . Hypertension   . Lower back pain   . Neuropathy   . Oxygen dependent    at hs  . Personal history of chemotherapy 2017   bladder ca  . Personal history of radiation therapy 2017   bladder ca  . Sleep apnea    uses cpap  . Urinary obstruction 01/2016    PAST SURGICAL HISTORY :   Past Surgical History:  Procedure Laterality Date  . ABDOMINAL HYSTERECTOMY    . AV FISTULA PLACEMENT Left 02/17/2017  Procedure: INSERTION OF ARTERIOVENOUS (AV) GORE-TEX GRAFT ARM(BRACHIAL AXILLARY);  Surgeon: Katha Cabal, MD;  Location: [facility];  Service: Vascular;  Laterality: Left;  . CYSTOSCOPY W/ RETROGRADES Bilateral 02/17/2016   Procedure: CYSTOSCOPY WITH RETROGRADE PYELOGRAM;  Surgeon: Hollice Espy, MD;  Location: [facility];  Service: Urology;  Laterality: Bilateral;  . CYSTOSCOPY W/ RETROGRADES Bilateral 10/12/2016   Procedure: CYSTOSCOPY WITH RETROGRADE  PYELOGRAM;  Surgeon: Hollice Espy, MD;  Location: [facility];  Service: Urology;  Laterality: Bilateral;  . CYSTOSCOPY W/ RETROGRADES Bilateral 06/09/2017   Procedure: CYSTOSCOPY WITH RETROGRADE PYELOGRAM;  Surgeon: Hollice Espy, MD;  Location: [facility];  Service: Urology;  Laterality: Bilateral;  . CYSTOSCOPY W/ RETROGRADES Bilateral 10/07/2017   Procedure: CYSTOSCOPY WITH RETROGRADE PYELOGRAM;  Surgeon: Hollice Espy, MD;  Location: [facility];  Service: Urology;  Laterality: Bilateral;  . CYSTOSCOPY W/ URETERAL STENT PLACEMENT Left 05/12/2016   Procedure: CYSTOSCOPY WITH STENT REPLACEMENT;  Surgeon: Hollice Espy, MD;  Location: [facility];  Service: Urology;  Laterality: Left;  . CYSTOSCOPY W/ URETERAL STENT PLACEMENT Left 10/12/2016   Procedure: CYSTOSCOPY WITH STENT REPLACEMENT;  Surgeon: Hollice Espy, MD;  Location: [facility];  Service: Urology;  Laterality: Left;  . CYSTOSCOPY WITH BIOPSY N/A 06/09/2017   Procedure: CYSTOSCOPY WITH BLADDER BIOPSY;  Surgeon: Hollice Espy, MD;  Location: [facility];  Service: Urology;  Laterality: N/A;  . CYSTOSCOPY WITH STENT PLACEMENT Left 01/21/2016   Procedure: CYSTOSCOPY WITH double J STENT PLACEMENT;  Surgeon: Franchot Gallo, MD;  Location: [facility];  Service: Urology;  Laterality: Left;  . CYSTOSCOPY WITH STENT PLACEMENT Right 10/12/2016   Procedure: CYSTOSCOPY WITH STENT PLACEMENT;  Surgeon: Hollice Espy, MD;  Location: [facility];  Service: Urology;  Laterality: Right;  . DIALYSIS/PERMA CATHETER INSERTION N/A 11/20/2016   Procedure: Dialysis/Perma Catheter Insertion;  Surgeon: Algernon Huxley, MD;  Location: [place] [facility];  Service: Cardiovascular;  Laterality: N/A;  . DIALYSIS/PERMA CATHETER REMOVAL N/A 03/30/2017   Procedure: DIALYSIS/PERMA CATHETER REMOVAL;  Surgeon: Katha Cabal, MD;  Location: [place] [facility];  Service: Cardiovascular;  Laterality: N/A;  . KIDNEY SURGERY  01/21/2016   IR NEPHROSTOMY PLACEMENT LEFT   . PERIPHERAL  VASCULAR CATHETERIZATION N/A 04/07/2016   Procedure: Glori Luis Cath Insertion;  Surgeon: Katha Cabal, MD;  Location: [place] [facility];  Service: Cardiovascular;  Laterality: N/A;  . PERIPHERAL VASCULAR THROMBECTOMY Left 06/02/2017   Procedure: PERIPHERAL VASCULAR THROMBECTOMY;  Surgeon: Algernon Huxley, MD;  Location: [facility];  Service: Cardiovascular;  Laterality: Left;  . PERIPHERAL VASCULAR THROMBECTOMY Left 01/28/2018   Procedure: PERIPHERAL VASCULAR THROMBECTOMY;  Surgeon: Katha Cabal, MD;  Location: [place] [facility];  Service: Cardiovascular;  Laterality: Left;  . PERIPHERAL VASCULAR THROMBECTOMY Left 03/14/2018   Procedure: PERIPHERAL VASCULAR THROMBECTOMY;  Surgeon: Algernon Huxley, MD;  Location: [facility];  Service: Cardiovascular;  Laterality: Left;  . PERIPHERAL VASCULAR THROMBECTOMY Left 03/16/2018   Procedure: PERIPHERAL VASCULAR THROMBECTOMY;  Surgeon: Katha Cabal, MD;  Location: [place] [facility];  Service: Cardiovascular;  Laterality: Left;  . PORTACATH PLACEMENT Right   . ROTATOR CUFF REPAIR Left   . TRANSURETHRAL RESECTION OF BLADDER TUMOR N/A 02/17/2016   Procedure: TRANSURETHRAL RESECTION OF BLADDER TUMOR (TURBT)-LARGE;  Surgeon: Hollice Espy, MD;  Location: [facility];  Service: Urology;  Laterality: N/A;  . TRANSURETHRAL RESECTION OF BLADDER TUMOR N/A 10/07/2017   Procedure: TRANSURETHRAL RESECTION OF BLADDER TUMOR (TURBT)-small;  Surgeon: Hollice Espy, MD;  Location: [facility];  Service: Urology;  Laterality: N/A;  . URETEROSCOPY Left 02/17/2016   Procedure: URETEROSCOPY;  Surgeon: Hollice Espy, MD;  Location: [facility];  Service: Urology;  Laterality: Left;  . URETEROSCOPY Right 10/12/2016   Procedure: URETEROSCOPY;  Surgeon: Hollice Espy, MD;  Location: [facility];  Service: Urology;  Laterality: Right;    FAMILY HISTORY :   Family History  Problem Relation Age of Onset  . Diabetes Mother   . Cancer Father   . Breast cancer  Neg Hx   . Kidney disease Neg Hx     SOCIAL HISTORY:   Social History   Tobacco Use  . Smoking status: Former Smoker    Types: Cigarettes    Last attempt to quit: 07/20/2002    Years since quitting: 15.7  . Smokeless tobacco: Never Used  . Tobacco comment: 01/22/2016   "  quit smoking many years ago "  Substance Use Topics  . Alcohol use: No  . Drug use: No    ALLERGIES:  is allergic to glipizide er and percocet [oxycodone-acetaminophen].  MEDICATIONS:  Current Outpatient Medications  Medication Sig Dispense Refill  . ACCU-CHEK AVIVA PLUS test strip 1 each by Other route daily. use as directed  3  . ACCU-CHEK SOFTCLIX LANCETS lancets INJECT 1 EACH AS DIRECTED ONCE DAILY.  3  . acetaminophen (TYLENOL) 500 MG tablet Take 1,000 mg by mouth 2 (two) times daily as needed for moderate pain or headache.     Marland Kitchen atorvastatin (LIPITOR) 40 MG tablet Take 40 mg by mouth daily.   5  . Blood Glucose Monitoring Suppl (ACCU-CHEK AVIVA PLUS) w/Device KIT (USE TO MONITOR BLOOD SUGARS.)  0  . calcium acetate (PHOSLO) 667 MG capsule Take 2 capsules (1,334 mg total) by mouth 3 (three) times daily with meals. 90 capsule 0  . cholecalciferol (VITAMIN D) 1000 units tablet Take 1,000 Units by mouth daily.    Marland Kitchen esomeprazole (NEXIUM) 40 MG capsule Take 40 mg by mouth daily after breakfast.     . ferrous sulfate 325 (65 FE) MG EC tablet Take 325 mg by mouth daily after breakfast.     . furosemide (LASIX) 40 MG tablet Take 40 mg by mouth 2 (two) times daily. Take morning dose every day. Lunch dose as needed.    . gabapentin (NEURONTIN) 300 MG capsule Take 600 mg by mouth 3 (three) times daily.    Marland Kitchen lidocaine (XYLOCAINE) 5 % ointment Apply 1 application topically 2 (two) times daily as needed for mild pain.     Marland Kitchen lidocaine-prilocaine (EMLA) cream APPLY TO AFFECTED AREA ONCE  4  . NON FORMULARY Place 1 Units into the nose at bedtime. CPAP Time of use 2100-0600    . oxybutynin (DITROPAN) 5 MG tablet Take 5 mg by  mouth 3 (three) times daily.    . OXYGEN Inhale 2 L into the lungs at bedtime.    . Albuterol Sulfate (PROAIR RESPICLICK) 196 (90 Base) MCG/ACT AEPB Inhale 2 puffs into the lungs 4 (four) times daily as needed (wheezing).      No current facility-administered medications for this visit.     PHYSICAL EXAMINATION: ECOG PERFORMANCE STATUS: 2 - Symptomatic, <50% confined to bed  BP (!) 153/66   Pulse 73   Temp (!) 97 F (36.1 C) (Tympanic)   Resp (!) 24   Ht 5' 8"  (1.727 m)   Wt 192 lb (87.1 kg)   SpO2 93%   BMI 29.19 kg/m   [employer]   04/18/18  1350  Weight: 192 lb (87.1 kg)    Physical Exam  Constitutional: She is oriented to person, place, and time and well-developed, well-nourished, and in no distress.  Obese.  Walks with a cane.  Accompanied by husband.  HENT:  Head: Normocephalic and atraumatic.  Mouth/Throat: Oropharynx is clear and moist. No oropharyngeal exudate.  Eyes: Pupils are equal, round, and reactive to light.  Neck: Normal range of motion. Neck supple.  Cardiovascular: Normal rate and regular rhythm.  Pulmonary/Chest: No respiratory distress. She has no wheezes.  Decreased breath sounds bilaterally bases.  Abdominal: Soft. Bowel sounds are normal. She exhibits no distension and no mass. There is no tenderness. There is no rebound and no guarding.  Musculoskeletal: Normal range of motion. She exhibits edema (Mild leg swelling.). She exhibits no tenderness.  Neurological: She is alert and oriented to person, place, and time.  Skin: Skin is warm.  Psychiatric: Affect normal.    LABORATORY DATA:  I have reviewed the data as listed    Component Value Date/Time   NA 139 01/30/2018 2003   NA 138 11/04/2016 1453   NA 138 08/07/2012 0433   K 3.5 01/30/2018 2003   K 3.2 (L) 10/30/2014 1410   CL 104 01/30/2018 2003   CL 104 08/07/2012 0433   CO2 23 01/30/2018 2003   CO2 25 08/07/2012 0433   GLUCOSE 125 (H) 01/30/2018 2003   GLUCOSE 172 (H) 08/07/2012  0433   BUN 44 (H) 01/30/2018 2003   BUN 69 (H) 11/04/2016 1453   BUN 63 (H) 08/07/2012 0433   CREATININE 10.06 (H) 01/30/2018 2003   CREATININE 2.48 (H) 08/07/2012 0433   CALCIUM 7.7 (L) 01/30/2018 2003   CALCIUM 9.1 08/07/2012 0433   PROT 8.7 (H) 09/03/2017 1140   PROT 8.9 (H) 08/02/2012 0958   ALBUMIN 4.1 09/03/2017 1140   ALBUMIN 3.9 08/02/2012 0958   AST 18 09/03/2017 1140   AST 21 08/02/2012 0958   ALT 12 (L) 09/03/2017 1140   ALT 19 08/02/2012 0958   ALKPHOS 167 (H) 09/03/2017 1140   ALKPHOS 107 08/02/2012 0958   BILITOT 0.6 09/03/2017 1140   BILITOT 0.3 08/02/2012 0958   GFRNONAA 3 (L) 01/30/2018 2003   GFRNONAA 19 (L) 08/07/2012 0433   GFRAA 4 (L) 01/30/2018 2003   GFRAA 22 (L) 08/07/2012 0433    No results found for: SPEP, UPEP  Lab Results  Component Value Date   WBC 4.9 01/30/2018   NEUTROABS 2.4 06/14/2017   HGB 10.3 (L) 01/30/2018   HCT 30.3 (L) 01/30/2018   MCV 100.1 (H) 01/30/2018   PLT 158 01/30/2018      Chemistry      Component Value Date/Time   NA 139 01/30/2018 2003   NA 138 11/04/2016 1453   NA 138 08/07/2012 0433   K 3.5 01/30/2018 2003   K 3.2 (L) 10/30/2014 1410   CL 104 01/30/2018 2003   CL 104 08/07/2012 0433   CO2 23 01/30/2018 2003   CO2 25 08/07/2012 0433   BUN 44 (H) 01/30/2018 2003   BUN 69 (H) 11/04/2016 1453   BUN 63 (H) 08/07/2012 0433   CREATININE 10.06 (H) 01/30/2018 2003   CREATININE 2.48 (H) 08/07/2012 0433      Component Value Date/Time   CALCIUM 7.7 (L) 01/30/2018 2003   CALCIUM 9.1 08/07/2012 0433   ALKPHOS 167 (H) 09/03/2017 1140   ALKPHOS 107 08/02/2012 0958   AST 18 09/03/2017 1140   AST 21 08/02/2012 0958  ALT 12 (L) 09/03/2017 1140   ALT 19 08/02/2012 0958   BILITOT 0.6 09/03/2017 1140   BILITOT 0.3 08/02/2012 0958       RADIOGRAPHIC STUDIES: I have personally reviewed the radiological images as listed and agreed with the findings in the report. No results found.   ASSESSMENT & PLAN:  Cancer of  trigone of urinary bladder ([place]) # muscle invasive bladder cancer/ pT2/stage II-status post definitive radiation [not cystectomy candidate].  Stable.  # Last PET scan July 2019-  was negative for metastatic disease.  June 2019- cysto-NEG.   #anemia/hemoglobin 10.4; continue p.o. Iron; STABLE.   # End-stage renal disease on dialysis [Dr.Singh]; STABLE.   # port flush every 2 months.   # follow up in 4 month/port flush/labs- PET scan prior- Dr.B    Orders Placed This Encounter  Procedures  . NM PET Image Restag (PS) Skull Base To Thigh    Standing Status:   Future    Standing Expiration Date:   04/18/2019    Order Specific Question:   ** REASON FOR EXAM (FREE TEXT)    Answer:   bladder cancer    Order Specific Question:   If indicated for the ordered procedure, I authorize the administration of a radiopharmaceutical per Radiology protocol    Answer:   Yes    Order Specific Question:   Preferred imaging location?    Answer:   [facility]    Order Specific Question:   Radiology Contrast Protocol - do NOT remove file path    Answer:   [email or web address]   All questions were answered. The patient knows to call the clinic with any problems, questions or concerns.      Cammie Sickle, MD 04/19/2018 8:26 AM

## 2018-05-10 ENCOUNTER — Inpatient Hospital Stay: Payer: Medicare Other

## 2018-05-10 ENCOUNTER — Encounter: Payer: Self-pay | Admitting: Emergency Medicine

## 2018-05-10 ENCOUNTER — Inpatient Hospital Stay
Admission: EM | Admit: 2018-05-10 | Discharge: 2018-05-13 | DRG: 252 | Disposition: A | Discharge status: Home Health | Payer: Medicare Other | Attending: Internal Medicine | Admitting: Internal Medicine

## 2018-05-10 ENCOUNTER — Other Ambulatory Visit: Payer: Self-pay

## 2018-05-10 DIAGNOSIS — R059 Cough, unspecified: Secondary | ICD-10-CM

## 2018-05-10 DIAGNOSIS — D631 Anemia in chronic kidney disease: Secondary | ICD-10-CM | POA: Diagnosis present

## 2018-05-10 DIAGNOSIS — Z87891 Personal history of nicotine dependence: Secondary | ICD-10-CM | POA: Diagnosis not present

## 2018-05-10 DIAGNOSIS — N2581 Secondary hyperparathyroidism of renal origin: Secondary | ICD-10-CM | POA: Diagnosis present

## 2018-05-10 DIAGNOSIS — Z992 Dependence on renal dialysis: Secondary | ICD-10-CM

## 2018-05-10 DIAGNOSIS — Z833 Family history of diabetes mellitus: Secondary | ICD-10-CM | POA: Diagnosis not present

## 2018-05-10 DIAGNOSIS — I2510 Atherosclerotic heart disease of native coronary artery without angina pectoris: Secondary | ICD-10-CM | POA: Diagnosis present

## 2018-05-10 DIAGNOSIS — E872 Acidosis, unspecified: Secondary | ICD-10-CM | POA: Diagnosis present

## 2018-05-10 DIAGNOSIS — K219 Gastro-esophageal reflux disease without esophagitis: Secondary | ICD-10-CM | POA: Diagnosis present

## 2018-05-10 DIAGNOSIS — Z9221 Personal history of antineoplastic chemotherapy: Secondary | ICD-10-CM | POA: Diagnosis not present

## 2018-05-10 DIAGNOSIS — T82868A Thrombosis of vascular prosthetic devices, implants and grafts, initial encounter: Principal | ICD-10-CM | POA: Diagnosis present

## 2018-05-10 DIAGNOSIS — I120 Hypertensive chronic kidney disease with stage 5 chronic kidney disease or end stage renal disease: Secondary | ICD-10-CM

## 2018-05-10 DIAGNOSIS — E1122 Type 2 diabetes mellitus with diabetic chronic kidney disease: Secondary | ICD-10-CM | POA: Diagnosis not present

## 2018-05-10 DIAGNOSIS — E785 Hyperlipidemia, unspecified: Secondary | ICD-10-CM | POA: Diagnosis present

## 2018-05-10 DIAGNOSIS — Z8551 Personal history of malignant neoplasm of bladder: Secondary | ICD-10-CM | POA: Diagnosis not present

## 2018-05-10 DIAGNOSIS — G4733 Obstructive sleep apnea (adult) (pediatric): Secondary | ICD-10-CM | POA: Diagnosis present

## 2018-05-10 DIAGNOSIS — E876 Hypokalemia: Secondary | ICD-10-CM | POA: Diagnosis present

## 2018-05-10 DIAGNOSIS — R05 Cough: Secondary | ICD-10-CM | POA: Diagnosis not present

## 2018-05-10 DIAGNOSIS — Z923 Personal history of irradiation: Secondary | ICD-10-CM | POA: Diagnosis not present

## 2018-05-10 DIAGNOSIS — T82898A Other specified complication of vascular prosthetic devices, implants and grafts, initial encounter: Secondary | ICD-10-CM

## 2018-05-10 DIAGNOSIS — Z9981 Dependence on supplemental oxygen: Secondary | ICD-10-CM

## 2018-05-10 DIAGNOSIS — N186 End stage renal disease: Secondary | ICD-10-CM | POA: Diagnosis present

## 2018-05-10 DIAGNOSIS — Y832 Surgical operation with anastomosis, bypass or graft as the cause of abnormal reaction of the patient, or of later complication, without mention of misadventure at the time of the procedure: Secondary | ICD-10-CM | POA: Diagnosis present

## 2018-05-10 DIAGNOSIS — J449 Chronic obstructive pulmonary disease, unspecified: Secondary | ICD-10-CM | POA: Diagnosis present

## 2018-05-10 LAB — CBC
HCT: 16.2 % — ABNORMAL LOW (ref 36.0–46.0)
HEMATOCRIT: 37.7 % (ref 36.0–46.0)
Hemoglobin: 4.7 g/dL — CL (ref 12.0–15.0)
Hemoglobin: 11.7 g/dL — ABNORMAL LOW (ref 12.0–15.0)
MCH: 30.5 pg (ref 26.0–34.0)
MCH: 30.6 pg (ref 26.0–34.0)
MCHC: 29 g/dL — ABNORMAL LOW (ref 30.0–36.0)
MCHC: 31 g/dL (ref 30.0–36.0)
MCV: 105.2 fL — AB (ref 80.0–100.0)
MCV: 98.7 fL (ref 80.0–100.0)
PLATELETS: 72 10*3/uL — AB (ref 150–400)
PLATELETS: 177 10*3/uL (ref 150–400)
RBC: 1.54 MIL/uL — AB (ref 3.87–5.11)
RBC: 3.82 MIL/uL — ABNORMAL LOW (ref 3.87–5.11)
RDW: 15.7 % — ABNORMAL HIGH (ref 11.5–15.5)
RDW: 15.6 % — AB (ref 11.5–15.5)
WBC: 2.3 10*3/uL — AB (ref 4.0–10.5)
WBC: 5 10*3/uL (ref 4.0–10.5)
nRBC: 0 % (ref 0.0–0.2)

## 2018-05-10 LAB — GLUCOSE, CAPILLARY: Glucose-Capillary: 92 mg/dL (ref 70–99)

## 2018-05-10 LAB — BASIC METABOLIC PANEL
Anion gap: 20 — ABNORMAL HIGH (ref 5–15)
BUN: 84 mg/dL — ABNORMAL HIGH (ref 8–23)
CO2: 11 mmol/L — ABNORMAL LOW (ref 22–32)
Calcium: 7.6 mg/dL — ABNORMAL LOW (ref 8.9–10.3)
Chloride: 111 mmol/L (ref 98–111)
Creatinine, Ser: 13.71 mg/dL — ABNORMAL HIGH (ref 0.44–1.00)
GFR, EST AFRICAN AMERICAN: 3 mL/min — AB (ref >60)
GFR, EST NON AFRICAN AMERICAN: 2 mL/min — AB (ref >60)
Glucose, Bld: 90 mg/dL (ref 70–99)
POTASSIUM: 4.5 mmol/L (ref 3.5–5.1)
SODIUM: 142 mmol/L (ref 135–145)

## 2018-05-10 MED ORDER — ONDANSETRON HCL 4 MG/2ML IJ SOLN
4.0000 mg | Freq: Four times a day (QID) | INTRAMUSCULAR | Status: DC | PRN
  Administered 2018-05-12: 4 mg via INTRAVENOUS
  Filled 2018-05-10: qty 2

## 2018-05-10 MED ORDER — SODIUM BICARBONATE 8.4 % IV SOLN
INTRAVENOUS | Status: AC
  Administered 2018-05-11: 08:00:00 via INTRAVENOUS
  Filled 2018-05-10: qty 150

## 2018-05-10 MED ORDER — ALBUTEROL SULFATE (2.5 MG/3ML) 0.083% IN NEBU: 2.5000 mg | INHALATION_SOLUTION | RESPIRATORY_TRACT | Status: DC | PRN

## 2018-05-10 MED ORDER — POLYETHYLENE GLYCOL 3350 17 G PO PACK: 17.0000 g | PACK | Freq: Every day | ORAL | Status: DC | PRN

## 2018-05-10 MED ORDER — ONDANSETRON HCL 4 MG PO TABS: 4.0000 mg | ORAL_TABLET | Freq: Four times a day (QID) | ORAL | Status: DC | PRN

## 2018-05-10 MED ORDER — HEPARIN SODIUM (PORCINE) 5000 UNIT/ML IJ SOLN
5000.0000 [IU] | Freq: Three times a day (TID) | INTRAMUSCULAR | Status: DC
  Administered 2018-05-11 – 2018-05-13 (×6): 5000 [IU] via SUBCUTANEOUS
  Filled 2018-05-10 (×6): qty 1

## 2018-05-10 MED ORDER — ACETAMINOPHEN 325 MG PO TABS
650.0000 mg | ORAL_TABLET | Freq: Four times a day (QID) | ORAL | Status: DC | PRN
  Administered 2018-05-12: 650 mg via ORAL
  Filled 2018-05-10: qty 2

## 2018-05-10 MED ORDER — INSULIN ASPART 100 UNIT/ML ~~LOC~~ SOLN: 0.0000 [IU] | Freq: Three times a day (TID) | SUBCUTANEOUS | Status: DC

## 2018-05-10 MED ORDER — ACETAMINOPHEN 650 MG RE SUPP: 650.0000 mg | Freq: Four times a day (QID) | RECTAL | Status: DC | PRN

## 2018-05-10 NOTE — ED Notes (Signed)
Pts husband came out, states pt wants to leave.  Dr. Harlow Asa spoke to husband- told him the surgeon was coming to speak with him soon.  Husband said they would wait a bit longer.

## 2018-05-10 NOTE — ED Notes (Signed)
Dr. Kerman Passey requests WBC redrawn. Does not want it drawn from port.  Pt is a hard stick.  Consult for IV team made.

## 2018-05-10 NOTE — ED Notes (Signed)
Pt refuses to keep pulse ox on finger.

## 2018-05-10 NOTE — Care Management (Signed)
Estill Bamberg with [employer] updated on patient status.

## 2018-05-10 NOTE — ED Notes (Signed)
Pt is very difficulty stick and limited to right arm due to dialysis.  Two failed attempts to receive blood work per this Therapist, sports and ED tech.  Lab notified to attempt to collect blood specimens.

## 2018-05-10 NOTE — Consult Note (Signed)
[facility] Admission History & Physical  MRN : [id number]  Barbara [name] is a 77 y.o. (Oct 24, 1940) female who presents with chief complaint of  Chief Complaint  Patient presents with  . Clotted Dialysis Access  .  History of Present Illness: I am asked to evaluate the patient by the dialysis center. The patient was sent here because they were unable to cannulate the left arm AV graft this morning. Furthermore the Center states there is no thrill or bruit. The patient states this is the first dialysis run to be missed. This problem is acute in onset and has been present for approximately 6 days. The patient states she has been to to other centers in the last 2 weeks for declots neither of which is lasted more than from you days.  Patient denies pain or tenderness overlying the access.  There is no pain with dialysis.  The patient denies hand pain or finger pain consistent with steal syndrome.   There have multiple past interventions or declots of this access.  The patient is not chronically hypotensive on dialysis.  Current Facility-Administered Medications  Medication Dose Route Frequency Provider Last Rate Last Dose  . acetaminophen (TYLENOL) tablet 650 mg  650 mg Oral Q6H PRN Hillary Bow, MD       Or  . acetaminophen (TYLENOL) suppository 650 mg  650 mg Rectal Q6H PRN Sudini, Srikar, MD      . albuterol (PROVENTIL) (2.5 MG/3ML) 0.083% nebulizer solution 2.5 mg  2.5 mg Nebulization Q2H PRN Sudini, Alveta Heimlich, MD      . heparin injection 5,000 Units  5,000 Units Subcutaneous Q8H Sudini, Srikar, MD      . Derrill Memo ON 05/11/2018] insulin aspart (novoLOG) injection 0-9 Units  0-9 Units Subcutaneous TID WC Sudini, Srikar, MD      . ondansetron (ZOFRAN) tablet 4 mg  4 mg Oral Q6H PRN Sudini, Alveta Heimlich, MD       Or  . ondansetron (ZOFRAN) injection 4 mg  4 mg Intravenous Q6H PRN Sudini, Srikar, MD      . polyethylene glycol (MIRALAX / GLYCOLAX) packet 17 g  17 g Oral Daily  PRN Sudini, Srikar, MD      . sodium bicarbonate 150 mEq in dextrose 5 % 1,000 mL infusion   Intravenous Continuous Hillary Bow, MD       Current Outpatient Medications  Medication Sig Dispense Refill  . acetaminophen (TYLENOL) 500 MG tablet Take 1,000 mg by mouth 2 (two) times daily as needed for moderate pain or headache.     . Albuterol Sulfate (PROAIR RESPICLICK) 937 (90 Base) MCG/ACT AEPB Inhale 2 puffs into the lungs 4 (four) times daily as needed (wheezing).     Marland Kitchen atorvastatin (LIPITOR) 40 MG tablet Take 40 mg by mouth daily.   5  . calcium acetate (PHOSLO) 667 MG capsule Take 2 capsules (1,334 mg total) by mouth 3 (three) times daily with meals. 90 capsule 0  . cholecalciferol (VITAMIN D) 1000 units tablet Take 1,000 Units by mouth daily.    . cinacalcet (SENSIPAR) 30 MG tablet Take 30 mg by mouth daily. With largest meal    . esomeprazole (NEXIUM) 40 MG capsule Take 40 mg by mouth daily after breakfast.     . ferrous sulfate 325 (65 FE) MG EC tablet Take 325 mg by mouth daily after breakfast.     . furosemide (LASIX) 40 MG tablet Take 40 mg by mouth 2 (two) times daily. Take morning dose  every day. Lunch dose as needed.    . gabapentin (NEURONTIN) 300 MG capsule Take 600 mg by mouth 3 (three) times daily.    Marland Kitchen lidocaine (XYLOCAINE) 5 % ointment Apply 1 application topically 2 (two) times daily as needed for mild pain.     Marland Kitchen lidocaine-prilocaine (EMLA) cream APPLY TO AFFECTED AREA ONCE  4  . mirabegron ER (MYRBETRIQ) 25 MG TB24 tablet Take 25 mg by mouth daily.    Marland Kitchen oxybutynin (DITROPAN) 5 MG tablet Take 5 mg by mouth 3 (three) times daily.    . traMADol (ULTRAM) 50 MG tablet Take 50 mg by mouth every 12 (twelve) hours as needed.    . Travoprost, BAK Free, (TRAVATAN) 0.004 % SOLN ophthalmic solution Place 1 drop into both eyes at bedtime.    Marland Kitchen ACCU-CHEK AVIVA PLUS test strip 1 each by Other route daily. use as directed  3  . ACCU-CHEK SOFTCLIX LANCETS lancets INJECT 1 EACH AS  DIRECTED ONCE DAILY.  3  . Blood Glucose Monitoring Suppl (ACCU-CHEK AVIVA PLUS) w/Device KIT (USE TO MONITOR BLOOD SUGARS.)  0  . NON FORMULARY Place 1 Units into the nose at bedtime. CPAP Time of use 2100-0600    . OXYGEN Inhale 2 L into the lungs at bedtime.      Past Medical History:  Diagnosis Date  . Adult celiac disease   . Anemia associated with chronic renal failure 2017   blood transfusion last week 10/17  . Arthritis   . Asthma   . 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 Chronic kidney disease   . 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  dr. Candiss Norse and dr. Holley Raring follow her  . COPD (chronic obstructive pulmonary disease) ([place])   . Coronary artery disease   . Diabetes mellitus without complication ([place])   . Dialysis patient ([place])    Tues, Thurs, Sat  . Dyspnea    with exertion  . Elevated lipids   . GERD (gastroesophageal reflux disease)   . Hematuria   . Hypertension   . Lower back pain   . Neuropathy   . Oxygen dependent    at hs  . Personal history of chemotherapy 2017   bladder ca  . Personal history of radiation therapy 2017   bladder ca  . Sleep apnea    uses cpap  . Urinary obstruction 01/2016    Past Surgical History:  Procedure Laterality Date  . ABDOMINAL HYSTERECTOMY    . AV FISTULA PLACEMENT Left 02/17/2017   Procedure: INSERTION OF ARTERIOVENOUS (AV) GORE-TEX GRAFT ARM(BRACHIAL AXILLARY);  Surgeon: Katha Cabal, MD;  Location: [facility];  Service: Vascular;  Laterality: Left;  . CYSTOSCOPY W/ RETROGRADES Bilateral 02/17/2016   Procedure: CYSTOSCOPY WITH RETROGRADE PYELOGRAM;  Surgeon: Hollice Espy, MD;  Location: [facility];  Service: Urology;  Laterality: Bilateral;  . CYSTOSCOPY W/ RETROGRADES Bilateral 10/12/2016   Procedure: CYSTOSCOPY WITH RETROGRADE PYELOGRAM;  Surgeon: Hollice Espy, MD;  Location: [facility];  Service: Urology;  Laterality: Bilateral;  . CYSTOSCOPY W/ RETROGRADES Bilateral 06/09/2017   Procedure: CYSTOSCOPY WITH  RETROGRADE PYELOGRAM;  Surgeon: Hollice Espy, MD;  Location: [facility];  Service: Urology;  Laterality: Bilateral;  . CYSTOSCOPY W/ RETROGRADES Bilateral 10/07/2017   Procedure: CYSTOSCOPY WITH RETROGRADE PYELOGRAM;  Surgeon: Hollice Espy, MD;  Location: [facility];  Service: Urology;  Laterality: Bilateral;  . CYSTOSCOPY W/ URETERAL STENT PLACEMENT Left 05/12/2016   Procedure: CYSTOSCOPY WITH STENT REPLACEMENT;  Surgeon: Hollice Espy,  MD;  Location: [facility];  Service: Urology;  Laterality: Left;  . CYSTOSCOPY W/ URETERAL STENT PLACEMENT Left 10/12/2016   Procedure: CYSTOSCOPY WITH STENT REPLACEMENT;  Surgeon: Hollice Espy, MD;  Location: [facility];  Service: Urology;  Laterality: Left;  . CYSTOSCOPY WITH BIOPSY N/A 06/09/2017   Procedure: CYSTOSCOPY WITH BLADDER BIOPSY;  Surgeon: Hollice Espy, MD;  Location: [facility];  Service: Urology;  Laterality: N/A;  . CYSTOSCOPY WITH STENT PLACEMENT Left 01/21/2016   Procedure: CYSTOSCOPY WITH double J STENT PLACEMENT;  Surgeon: Franchot Gallo, MD;  Location: [facility];  Service: Urology;  Laterality: Left;  . CYSTOSCOPY WITH STENT PLACEMENT Right 10/12/2016   Procedure: CYSTOSCOPY WITH STENT PLACEMENT;  Surgeon: Hollice Espy, MD;  Location: [facility];  Service: Urology;  Laterality: Right;  . DIALYSIS/PERMA CATHETER INSERTION N/A 11/20/2016   Procedure: Dialysis/Perma Catheter Insertion;  Surgeon: Algernon Huxley, MD;  Location: [facility];  Service: Cardiovascular;  Laterality: N/A;  . DIALYSIS/PERMA CATHETER REMOVAL N/A 03/30/2017   Procedure: DIALYSIS/PERMA CATHETER REMOVAL;  Surgeon: Katha Cabal, MD;  Location: [facility];  Service: Cardiovascular;  Laterality: N/A;  . KIDNEY SURGERY  01/21/2016   IR NEPHROSTOMY PLACEMENT LEFT   . PERIPHERAL VASCULAR CATHETERIZATION N/A 04/07/2016   Procedure: Glori Luis Cath Insertion;  Surgeon: Katha Cabal, MD;  Location: [facility];  Service: Cardiovascular;  Laterality: N/A;   . PERIPHERAL VASCULAR THROMBECTOMY Left 06/02/2017   Procedure: PERIPHERAL VASCULAR THROMBECTOMY;  Surgeon: Algernon Huxley, MD;  Location: [facility];  Service: Cardiovascular;  Laterality: Left;  . PERIPHERAL VASCULAR THROMBECTOMY Left 01/28/2018   Procedure: PERIPHERAL VASCULAR THROMBECTOMY;  Surgeon: Katha Cabal, MD;  Location: [facility];  Service: Cardiovascular;  Laterality: Left;  . PERIPHERAL VASCULAR THROMBECTOMY Left 03/14/2018   Procedure: PERIPHERAL VASCULAR THROMBECTOMY;  Surgeon: Algernon Huxley, MD;  Location: [facility];  Service: Cardiovascular;  Laterality: Left;  . PERIPHERAL VASCULAR THROMBECTOMY Left 03/16/2018   Procedure: PERIPHERAL VASCULAR THROMBECTOMY;  Surgeon: Katha Cabal, MD;  Location: [facility];  Service: Cardiovascular;  Laterality: Left;  . PORTACATH PLACEMENT Right   . ROTATOR CUFF REPAIR Left   . TRANSURETHRAL RESECTION OF BLADDER TUMOR N/A 02/17/2016   Procedure: TRANSURETHRAL RESECTION OF BLADDER TUMOR (TURBT)-LARGE;  Surgeon: Hollice Espy, MD;  Location: [facility];  Service: Urology;  Laterality: N/A;  . TRANSURETHRAL RESECTION OF BLADDER TUMOR N/A 10/07/2017   Procedure: TRANSURETHRAL RESECTION OF BLADDER TUMOR (TURBT)-small;  Surgeon: Hollice Espy, MD;  Location: [facility];  Service: Urology;  Laterality: N/A;  . URETEROSCOPY Left 02/17/2016   Procedure: URETEROSCOPY;  Surgeon: Hollice Espy, MD;  Location: [facility];  Service: Urology;  Laterality: Left;  . URETEROSCOPY Right 10/12/2016   Procedure: URETEROSCOPY;  Surgeon: Hollice Espy, MD;  Location: [facility];  Service: Urology;  Laterality: Right;    Social History Social History   Tobacco Use  . Smoking status: Former Smoker    Types: Cigarettes    Last attempt to quit: 07/20/2002    Years since quitting: 15.8  . Smokeless tobacco: Never Used  . Tobacco comment: 01/22/2016   "  quit smoking many years ago "  Substance Use Topics  . Alcohol use:  No  . Drug use: No    Family History Family History  Problem Relation Age of Onset  . Diabetes Mother   . Cancer Father   . Breast cancer Neg Hx   . Kidney disease Neg Hx  No family history of bleeding or clotting disorders, autoimmune disease or porphyria  Allergies  Allergen Reactions  . Glipizide Er Other (See Comments)    Hypoglycemia   . Percocet [Oxycodone-Acetaminophen] Hives     REVIEW OF SYSTEMS (Negative unless checked)  Constitutional: [] Weight loss  [] Fever  [] Chills Cardiac: [] Chest pain   [] Chest pressure   [] Palpitations   [] Shortness of breath when laying flat   [] Shortness of breath at rest   [x] Shortness of breath with exertion. Vascular:  [] Pain in legs with walking   [] Pain in legs at rest   [] Pain in legs when laying flat   [] Claudication   [] Pain in feet when walking  [] Pain in feet at rest  [] Pain in feet when laying flat   [] History of DVT   [] Phlebitis   [] Swelling in legs   [] Varicose veins   [] Non-healing ulcers Pulmonary:   [] Uses home oxygen   [] Productive cough   [] Hemoptysis   [] Wheeze  [] COPD   [] Asthma Neurologic:  [] Dizziness  [] Blackouts   [] Seizures   [] History of stroke   [] History of TIA  [] Aphasia   [] Temporary blindness   [] Dysphagia   [] Weakness or numbness in arms   [] Weakness or numbness in legs Musculoskeletal:  [] Arthritis   [] Joint swelling   [] Joint pain   [] Low back pain Hematologic:  [] Easy bruising  [] Easy bleeding   [] Hypercoagulable state   [] Anemic  [] Hepatitis Gastrointestinal:  [] Blood in stool   [] Vomiting blood  [] Gastroesophageal reflux/heartburn   [] Difficulty swallowing. Genitourinary:  [x] Chronic kidney disease   [] Difficult urination  [] Frequent urination  [] Burning with urination   [] Blood in urine Skin:  [] Rashes   [] Ulcers   [] Wounds Psychological:  [] History of anxiety   []  History of major depression.  Physical Examination  Vitals:   05/10/18 1700 05/10/18 1715 05/10/18 1730 05/10/18 1745  BP: (!) 143/73    (!) 154/77  Pulse: (!) 59 60 61 62  Resp:      Temp:      TempSrc:      SpO2: 91% 91% 90% 92%  Weight:      Height:       Body mass index is 28.59 kg/m. Gen: WD/WN, NAD Head: [place]/AT, No temporalis wasting. Prominent temp pulse not noted. Ear/Nose/Throat: Hearing grossly intact, nares w/o erythema or drainage, oropharynx w/o Erythema/Exudate,  Eyes: Conjunctiva clear, sclera non-icteric Neck: Trachea midline.  No JVD.  Pulmonary:  Good air movement, respirations not labored, no use of accessory muscles.  Cardiac: RRR, normal S1, S2. Vascular: Left arm brachial axillary graft no thrill no bruit Vessel Right Left  Radial Palpable Palpable  Ulnar Not Palpable Not Palpable  Brachial Palpable Palpable  Carotid Palpable, without bruit Palpable, without bruit  Gastrointestinal: soft, non-tender/non-distended. No guarding/reflex.  Musculoskeletal: M/S 5/5 throughout.  Extremities without ischemic changes.  No deformity or atrophy.  Neurologic: Sensation grossly intact in extremities.  Symmetrical.  Speech is fluent. Motor exam as listed above. Psychiatric: Judgment intact, Mood & affect appropriate for pt's clinical situation. Dermatologic: No rashes or ulcers noted.  No cellulitis or open wounds. Lymph : No Cervical, Axillary, or Inguinal lymphadenopathy.   CBC Lab Results  Component Value Date   WBC 4.9 01/30/2018   HGB 10.3 (L) 01/30/2018   HCT 30.3 (L) 01/30/2018   MCV 100.1 (H) 01/30/2018   PLT 158 01/30/2018    BMET    Component Value Date/Time   NA 142 05/10/2018 1122   NA 138 11/04/2016 1453  NA 138 08/07/2012 0433   K 4.5 05/10/2018 1122   K 3.2 (L) 10/30/2014 1410   CL 111 05/10/2018 1122   CL 104 08/07/2012 0433   CO2 11 (L) 05/10/2018 1122   CO2 25 08/07/2012 0433   GLUCOSE 90 05/10/2018 1122   GLUCOSE 172 (H) 08/07/2012 0433   BUN 84 (H) 05/10/2018 1122   BUN 69 (H) 11/04/2016 1453   BUN 63 (H) 08/07/2012 0433   CREATININE 13.71 (H) 05/10/2018 1122    CREATININE 2.48 (H) 08/07/2012 0433   CALCIUM 7.6 (L) 05/10/2018 1122   CALCIUM 9.1 08/07/2012 0433   GFRNONAA 2 (L) 05/10/2018 1122   GFRNONAA 19 (L) 08/07/2012 0433   GFRAA 3 (L) 05/10/2018 1122   GFRAA 22 (L) 08/07/2012 0433   Estimated Creatinine Clearance: 3.9 mL/min (A) (by C-G formula based on SCr of 13.71 mg/dL (H)).  COAG Lab Results  Component Value Date   INR 1.08 02/08/2017   INR 1.23 01/25/2016   INR 1.38 01/24/2016    Radiology No results found.  Assessment/Plan 1.  Complication dialysis device with thrombosis AV access:  Patient's left arm AV graft dialysis access is thrombosed. The patient will undergo thrombectomy using interventional techniques but she has missed dialysis for nearly a week.  She will be admitted to the hospital and I will make arrangements for a temporary catheter to be placed so that we can reestablish her dialysis access.  Subsequently the thrombectomy can be performed.  The risks and benefits were described to the patient.  All questions were answered.  The patient agrees to proceed with angiography and intervention. Potassium will be drawn to ensure that it is an appropriate level prior to performing thrombectomy. 2.  End-stage renal disease requiring hemodialysis:  Patient will continue dialysis therapy without further interruption if a successful thrombectomy is not achieved then catheter will be placed. Dialysis has already been arranged since the patient missed their previous session 3.  Hypertension:  Patient will continue medical management; nephrology is following no changes in oral medications. 4. Diabetes mellitus:  Glucose will be monitored and oral medications been held this morning once the patient has undergone the patient's procedure po intake will be reinitiated and again Accu-Cheks will be used to assess the blood glucose level and treat as needed. The patient will be restarted on the patient's usual hypoglycemic regime 5.  Coronary  artery disease:  EKG will be monitored. Nitrates will be used if needed. The patient's oral cardiac medications will be continued.    [name], MD  05/10/2018 7:13 PM

## 2018-05-10 NOTE — ED Notes (Addendum)
Pt changed by this tech and Laura,EDT. Pure wick in place and bed linen changed as ell. Call light placed on bed rail within reach, pt encouraged to use call bell for her needs. Meal tray given to pt.

## 2018-05-10 NOTE — ED Notes (Signed)
Lab called- requested someone to come straight stick pt for CBC.  Dr. Kerman Passey does not want it obtained from the port.

## 2018-05-10 NOTE — ED Notes (Signed)
MD in room

## 2018-05-10 NOTE — ED Triage Notes (Signed)
Pt in via POV, reports fistula is clotted off.  Pt reports has not had dialysis since last Wednesday due to this being a recurring problem, reports being seen in hospital two time prior for same, having fistula declotted but by the time she makes it to next dialysis treatment, fistula clots back off.  Pt reports dialysis center continues to adviser her to be seen in ED, pt has not followed up w/ vascular surgeon, Schnier which is who placed the fistula.  Vitals WDL, pt denies any other complaints.  NAD noted at this time.

## 2018-05-10 NOTE — H&P (Signed)
[facility] NAME: Barbara [name]    MR#:  [id number]  DATE OF BIRTH:  1940/12/18  DATE OF ADMISSION:  05/10/2018  PRIMARY CARE PHYSICIAN: [name], MD   REQUESTING/REFERRING PHYSICIAN: Dr. Kerman Passey  CHIEF COMPLAINT:   Chief Complaint  Patient presents with  . Clotted Dialysis Access    HISTORY OF PRESENT ILLNESS:  Barbara [name]  is a 77 y.o. female with a known history of hypertension, diabetes mellitus, incisional disease has been having issues with her AV fistula.  This clotted and declotting was attempted.  After this dialysis could not be done again.  Patient has not had hemodialysis for close to a week now.  She does complain of cough but no shortness of breath.  Feels weak.  No nausea.  At this time she has been found to have metabolic acidosis with bicarb low at 11.  Patient is being admitted to the hospital and will have her AV fistula procedure and have a temporary dialysis catheter placed for her ongoing dialysis needs.  PAST MEDICAL HISTORY:   Past Medical History:  Diagnosis Date  . Adult celiac disease   . Anemia associated with chronic renal failure 2017   blood transfusion last week 10/17  . Arthritis   . Asthma   . 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 Chronic kidney disease   . 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  dr. Candiss Norse and dr. Holley Raring follow her  . COPD (chronic obstructive pulmonary disease) ([place])   . Coronary artery disease   . Diabetes mellitus without complication ([place])   . Dialysis patient ([place])    Tues, Thurs, Sat  . Dyspnea    with exertion  . Elevated lipids   . GERD (gastroesophageal reflux disease)   . Hematuria   . Hypertension   . Lower back pain   . Neuropathy   . Oxygen dependent    at hs  . Personal history of chemotherapy 2017   bladder ca  . Personal history of radiation therapy 2017   bladder ca  . Sleep apnea    uses cpap  . Urinary obstruction 01/2016     PAST SURGICAL HISTORY:   Past Surgical History:  Procedure Laterality Date  . ABDOMINAL HYSTERECTOMY    . AV FISTULA PLACEMENT Left 02/17/2017   Procedure: INSERTION OF ARTERIOVENOUS (AV) GORE-TEX GRAFT ARM(BRACHIAL AXILLARY);  Surgeon: Katha Cabal, MD;  Location: [facility];  Service: Vascular;  Laterality: Left;  . CYSTOSCOPY W/ RETROGRADES Bilateral 02/17/2016   Procedure: CYSTOSCOPY WITH RETROGRADE PYELOGRAM;  Surgeon: Hollice Espy, MD;  Location: [facility];  Service: Urology;  Laterality: Bilateral;  . CYSTOSCOPY W/ RETROGRADES Bilateral 10/12/2016   Procedure: CYSTOSCOPY WITH RETROGRADE PYELOGRAM;  Surgeon: Hollice Espy, MD;  Location: [facility];  Service: Urology;  Laterality: Bilateral;  . CYSTOSCOPY W/ RETROGRADES Bilateral 06/09/2017   Procedure: CYSTOSCOPY WITH RETROGRADE PYELOGRAM;  Surgeon: Hollice Espy, MD;  Location: [facility];  Service: Urology;  Laterality: Bilateral;  . CYSTOSCOPY W/ RETROGRADES Bilateral 10/07/2017   Procedure: CYSTOSCOPY WITH RETROGRADE PYELOGRAM;  Surgeon: Hollice Espy, MD;  Location: [facility];  Service: Urology;  Laterality: Bilateral;  . CYSTOSCOPY W/ URETERAL STENT PLACEMENT Left 05/12/2016   Procedure: CYSTOSCOPY WITH STENT REPLACEMENT;  Surgeon: Hollice Espy, MD;  Location: [facility];  Service: Urology;  Laterality: Left;  . CYSTOSCOPY W/ URETERAL STENT PLACEMENT Left 10/12/2016   Procedure: CYSTOSCOPY WITH STENT REPLACEMENT;  Surgeon:  Hollice Espy, MD;  Location: [facility];  Service: Urology;  Laterality: Left;  . CYSTOSCOPY WITH BIOPSY N/A 06/09/2017   Procedure: CYSTOSCOPY WITH BLADDER BIOPSY;  Surgeon: Hollice Espy, MD;  Location: [facility];  Service: Urology;  Laterality: N/A;  . CYSTOSCOPY WITH STENT PLACEMENT Left 01/21/2016   Procedure: CYSTOSCOPY WITH double J STENT PLACEMENT;  Surgeon: Franchot Gallo, MD;  Location: [facility];  Service: Urology;  Laterality: Left;  . CYSTOSCOPY WITH STENT PLACEMENT Right 10/12/2016    Procedure: CYSTOSCOPY WITH STENT PLACEMENT;  Surgeon: Hollice Espy, MD;  Location: [facility];  Service: Urology;  Laterality: Right;  . DIALYSIS/PERMA CATHETER INSERTION N/A 11/20/2016   Procedure: Dialysis/Perma Catheter Insertion;  Surgeon: Algernon Huxley, MD;  Location: [place] [facility];  Service: Cardiovascular;  Laterality: N/A;  . DIALYSIS/PERMA CATHETER REMOVAL N/A 03/30/2017   Procedure: DIALYSIS/PERMA CATHETER REMOVAL;  Surgeon: Katha Cabal, MD;  Location: [place] [facility];  Service: Cardiovascular;  Laterality: N/A;  . KIDNEY SURGERY  01/21/2016   IR NEPHROSTOMY PLACEMENT LEFT   . PERIPHERAL VASCULAR CATHETERIZATION N/A 04/07/2016   Procedure: Glori Luis Cath Insertion;  Surgeon: Katha Cabal, MD;  Location: [place] [facility];  Service: Cardiovascular;  Laterality: N/A;  . PERIPHERAL VASCULAR THROMBECTOMY Left 06/02/2017   Procedure: PERIPHERAL VASCULAR THROMBECTOMY;  Surgeon: Algernon Huxley, MD;  Location: [place] [facility];  Service: Cardiovascular;  Laterality: Left;  . PERIPHERAL VASCULAR THROMBECTOMY Left 01/28/2018   Procedure: PERIPHERAL VASCULAR THROMBECTOMY;  Surgeon: Katha Cabal, MD;  Location: [place] [facility];  Service: Cardiovascular;  Laterality: Left;  . PERIPHERAL VASCULAR THROMBECTOMY Left 03/14/2018   Procedure: PERIPHERAL VASCULAR THROMBECTOMY;  Surgeon: Algernon Huxley, MD;  Location: [place] [facility];  Service: Cardiovascular;  Laterality: Left;  . PERIPHERAL VASCULAR THROMBECTOMY Left 03/16/2018   Procedure: PERIPHERAL VASCULAR THROMBECTOMY;  Surgeon: Katha Cabal, MD;  Location: [place] [facility];  Service: Cardiovascular;  Laterality: Left;  . PORTACATH PLACEMENT Right   . ROTATOR CUFF REPAIR Left   . TRANSURETHRAL RESECTION OF BLADDER TUMOR N/A 02/17/2016   Procedure: TRANSURETHRAL RESECTION OF BLADDER TUMOR (TURBT)-LARGE;  Surgeon: Hollice Espy, MD;  Location: [facility];  Service: Urology;  Laterality: N/A;  . TRANSURETHRAL  RESECTION OF BLADDER TUMOR N/A 10/07/2017   Procedure: TRANSURETHRAL RESECTION OF BLADDER TUMOR (TURBT)-small;  Surgeon: Hollice Espy, MD;  Location: [facility];  Service: Urology;  Laterality: N/A;  . URETEROSCOPY Left 02/17/2016   Procedure: URETEROSCOPY;  Surgeon: Hollice Espy, MD;  Location: [facility];  Service: Urology;  Laterality: Left;  . URETEROSCOPY Right 10/12/2016   Procedure: URETEROSCOPY;  Surgeon: Hollice Espy, MD;  Location: [facility];  Service: Urology;  Laterality: Right;    SOCIAL HISTORY:   Social History   Tobacco Use  . Smoking status: Former Smoker    Types: Cigarettes    Last attempt to quit: 07/20/2002    Years since quitting: 15.8  . Smokeless tobacco: Never Used  . Tobacco comment: 01/22/2016   "  quit smoking many years ago "  Substance Use Topics  . Alcohol use: No    FAMILY HISTORY:   Family History  Problem Relation Age of Onset  . Diabetes Mother   . Cancer Father   . Breast cancer Neg Hx   . Kidney disease Neg Hx     DRUG ALLERGIES:   Allergies  Allergen Reactions  . Glipizide Er Other (See Comments)    Hypoglycemia   . Percocet [Oxycodone-Acetaminophen] Hives  REVIEW OF SYSTEMS:   Review of Systems  Constitutional: Positive for malaise/fatigue. Negative for chills and fever.  HENT: Negative for sore throat.   Eyes: Negative for blurred vision, double vision and pain.  Respiratory: Positive for cough. Negative for hemoptysis, shortness of breath and wheezing.   Cardiovascular: Negative for chest pain, palpitations, orthopnea and leg swelling.  Gastrointestinal: Negative for abdominal pain, constipation, diarrhea, heartburn, nausea and vomiting.  Genitourinary: Negative for dysuria and hematuria.  Musculoskeletal: Negative for back pain and joint pain.  Skin: Negative for rash.  Neurological: Negative for sensory change, speech change, focal weakness and headaches.  Endo/Heme/Allergies: Does not bruise/bleed easily.   Psychiatric/Behavioral: Negative for depression. The patient is not nervous/anxious.     MEDICATIONS AT HOME:   Prior to Admission medications   Medication Sig Start Date End Date Taking? Authorizing Provider  ACCU-CHEK AVIVA PLUS test strip 1 each by Other route daily. use as directed 01/26/17   [provider]  ACCU-CHEK SOFTCLIX LANCETS lancets INJECT 1 EACH AS DIRECTED ONCE DAILY. 05/28/17   [provider]  acetaminophen (TYLENOL) 500 MG tablet Take 1,000 mg by mouth 2 (two) times daily as needed for moderate pain or headache.     [provider]  Albuterol Sulfate (PROAIR RESPICLICK) 284 (90 Base) MCG/ACT AEPB Inhale 2 puffs into the lungs 4 (four) times daily as needed (wheezing).     [provider]  atorvastatin (LIPITOR) 40 MG tablet Take 40 mg by mouth daily.  11/01/16   [provider]  Blood Glucose Monitoring Suppl (ACCU-CHEK AVIVA PLUS) w/Device KIT (USE TO MONITOR BLOOD SUGARS.) 12/30/17   [provider]  calcium acetate (PHOSLO) 667 MG capsule Take 2 capsules (1,334 mg total) by mouth 3 (three) times daily with meals. 11/23/16   Fritzi Mandes, MD  cholecalciferol (VITAMIN D) 1000 units tablet Take 1,000 Units by mouth daily.    [provider]  esomeprazole (NEXIUM) 40 MG capsule Take 40 mg by mouth daily after breakfast.     [provider]  ferrous sulfate 325 (65 FE) MG EC tablet Take 325 mg by mouth daily after breakfast.     [provider]  furosemide (LASIX) 40 MG tablet Take 40 mg by mouth 2 (two) times daily. Take morning dose every day. Lunch dose as needed.    [provider]  gabapentin (NEURONTIN) 300 MG capsule Take 600 mg by mouth 3 (three) times daily.    [provider]  lidocaine (XYLOCAINE) 5 % ointment Apply 1 application topically 2 (two) times daily as needed for mild pain.  05/21/16   [provider]  lidocaine-prilocaine (EMLA) cream APPLY TO AFFECTED AREA  ONCE 03/22/18   [provider]  NON FORMULARY Place 1 Units into the nose at bedtime. CPAP Time of use 2100-0600    [provider]  oxybutynin (DITROPAN) 5 MG tablet Take 5 mg by mouth 3 (three) times daily.    [provider]  OXYGEN Inhale 2 L into the lungs at bedtime.    [provider]     VITAL SIGNS:  Blood pressure (!) 154/77, pulse 62, temperature 97.7 F (36.5 C), temperature source Oral, resp. rate 16, height 5' 8"  (1.727 m), weight 85.3 kg, SpO2 92 %.  PHYSICAL EXAMINATION:  Physical Exam  GENERAL:  77 y.o.-year-old patient lying in the bed with no acute distress.  EYES: Pupils equal, round, reactive to light and accommodation. No scleral icterus. Extraocular muscles intact.  HEENT:  Head atraumatic, normocephalic. Oropharynx and nasopharynx clear. No oropharyngeal erythema, moist oral mucosa  NECK:  Supple, no jugular venous distention. No thyroid enlargement, no tenderness.  LUNGS: Normal breath sounds bilaterally, no wheezing, rales, rhonchi. No use of accessory muscles of respiration.  CARDIOVASCULAR: S1, S2 normal. No murmurs, rubs, or gallops.  ABDOMEN: Soft, nontender, nondistended. Bowel sounds present. No organomegaly or mass.  EXTREMITIES: No pedal edema, cyanosis, or clubbing. + 2 pedal & radial pulses b/l.   NEUROLOGIC: Cranial nerves II through XII are intact. No focal Motor or sensory deficits appreciated b/l PSYCHIATRIC: The patient is alert and oriented x 3. Good affect.  SKIN: No obvious rash, lesion, or ulcer.   LABORATORY PANEL:   CBC No results for input(s): WBC, HGB, HCT, PLT in the last 168 hours. ------------------------------------------------------------------------------------------------------------------  Chemistries  Recent Labs  Lab 05/10/18 1122  NA 142  K 4.5  CL 111  CO2 11*  GLUCOSE 90  BUN 84*  CREATININE 13.71*  CALCIUM 7.6*    ------------------------------------------------------------------------------------------------------------------  Cardiac Enzymes No results for input(s): TROPONINI in the last 168 hours. ------------------------------------------------------------------------------------------------------------------  RADIOLOGY:  No results found.   IMPRESSION AND PLAN:   * ESRD with AVF dysfunction Patient will have dialysis catheter placed by Dr. Delana Meyer.  Discussed with him. Discussed with Dr. Zollie Scale regarding hemodialysis needs in the hospital  * Metabolic acidosis Bicarbonate 11.  Due to missing hemodialysis.  Will start bicarb drip.  *Hypertension.  Continue home medication  *Diabetes mellitus.  Sliding scale insulin added  All the records are reviewed and case discussed with ED provider. Management plans discussed with the patient, family and they are in agreement.  CODE STATUS: FULL CODE  TOTAL TIME TAKING CARE OF THIS PATIENT: 40 minutes.   Leia Alf Cassey Bacigalupo M.D on 05/10/2018 at 6:31 PM  Between 7am to 6pm - Pager - [phone]  After 6pm go to [email or web address] - password [email or web address] [facility]  Office  [phone]  CC: Primary care physician; [name], MD  Note: This dictation was prepared with Dragon dictation along with smaller phrase technology. Any transcriptional errors that result from this process are unintentional.

## 2018-05-10 NOTE — ED Provider Notes (Addendum)
[facility] Emergency Department Provider Note  Time seen: 4:06 PM  I have reviewed the triage vital signs and the nursing notes.   HISTORY  Chief Complaint Clotted Dialysis Access    HPI Barbara [name] is a 77 y.o. female with a past medical history of anemia, end-stage renal disease on hemodialysis, COPD, diabetes, hypertension, presents to the emergency department for a clotted AV fistula.  According to the patient she recently had a declotting procedure performed by Dr. Delana Meyer of vascular surgery.  Went to dialysis today for dialysis unfortunately the fistula was once again clotted and they were not able to access for dialysis.  Patient was told to return back to the emergency department.  Here the patient appears well, has no complaints no shortness of breath nausea or vomiting.  Patient states she does create a small amount of urine every day.  Has no other symptoms at this time.   Past Medical History:  Diagnosis Date  . Adult celiac disease   . Anemia associated with chronic renal failure 2017   blood transfusion last week 10/17  . Arthritis   . Asthma   . 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 Chronic kidney disease   . 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  dr. Candiss Norse and dr. Holley Raring follow her  . COPD (chronic obstructive pulmonary disease) ([place])   . Coronary artery disease   . Diabetes mellitus without complication ([place])   . Dialysis patient ([place])    Tues, Thurs, Sat  . Dyspnea    with exertion  . Elevated lipids   . GERD (gastroesophageal reflux disease)   . Hematuria   . Hypertension   . Lower back pain   . Neuropathy   . Oxygen dependent    at hs  . Personal history of chemotherapy 2017   bladder ca  . Personal history of radiation therapy 2017   bladder ca  . Sleep apnea    uses cpap  . Urinary obstruction 01/2016    Patient Active Problem List   Diagnosis Date Noted  . Complication from renal dialysis device  01/28/2018  . History of colonic polyps 07/30/2017  . Presence of cardiac and vascular implant and graft   . Fever   . Sepsis ([place])   . End stage renal disease ([place]) 02/01/2017  . Sepsis secondary to UTI ([place]) 01/24/2017  . Acute kidney injury ([place]) 11/19/2016  . Anemia, chronic renal failure, stage 4 (severe) ([place]) 09/17/2016  . Hypoglycemia 06/04/2016  . Hyperkalemia 05/12/2016  . Protein-calorie malnutrition, severe 05/05/2016  . Acute on chronic renal failure ([place]) 05/04/2016  . Seizure ([place]) 04/17/2016  . Palliative care by specialist   . DNR (do not resuscitate) discussion   . C. difficile diarrhea 03/11/2016  . Anemia 03/11/2016  . Hypotension 03/11/2016  . Acidosis 03/11/2016  . Failure to thrive (child) 03/11/2016  . Weakness generalized 03/11/2016  . ARF (acute renal failure) ([place]) 03/04/2016  . 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 Absolute anemia 02/04/2016  . Airway hyperreactivity 02/04/2016  . Celiac disease 02/04/2016  . Gastric catarrh 02/04/2016  . Acid reflux 02/04/2016  . Combined fat and carbohydrate induced hyperlipemia 02/04/2016  . C. difficile colitis 01/22/2016  . Urinary obstruction 01/21/2016  . COPD (chronic obstructive pulmonary disease) ([place]) 01/21/2016  . Controlled type 2 diabetes mellitus with stage 4 chronic kidney disease, with long-term current use of insulin ([place]) 01/21/2016  .  Essential hypertension 01/21/2016  . Acute renal failure superimposed on stage 4 chronic kidney disease ([place]) 01/20/2016  . Anemia in chronic kidney disease 01/20/2016  . Arthritis of knee, degenerative 05/10/2015  . Knee strain 03/26/2015  . Other intervertebral disc displacement, lumbar region 03/04/2015  . Degenerative arthritis of lumbar spine 03/04/2015  . HNP (herniated nucleus pulposus), lumbar 03/04/2015  . Osteoarthritis of spine with radiculopathy, lumbar region 03/04/2015  . Injury of tendon of upper extremity 11/12/2014  .  Atherosclerosis of abdominal aorta ([place]) 11/02/2014  . Chronic kidney disease, stage IV (severe) ([place]) 11/02/2014  . Obstructive apnea 11/02/2014  . Complete rotator cuff rupture of left shoulder 10/05/2014  . Arthritis of shoulder region, degenerative 10/05/2014  . CAD in native artery 12/08/2013  . Benign essential HTN 12/08/2013  . Type 2 diabetes mellitus ([place]) 12/08/2013    Past Surgical History:  Procedure Laterality Date  . ABDOMINAL HYSTERECTOMY    . AV FISTULA PLACEMENT Left 02/17/2017   Procedure: INSERTION OF ARTERIOVENOUS (AV) GORE-TEX GRAFT ARM(BRACHIAL AXILLARY);  Surgeon: Katha Cabal, MD;  Location: [facility];  Service: Vascular;  Laterality: Left;  . CYSTOSCOPY W/ RETROGRADES Bilateral 02/17/2016   Procedure: CYSTOSCOPY WITH RETROGRADE PYELOGRAM;  Surgeon: Hollice Espy, MD;  Location: [facility];  Service: Urology;  Laterality: Bilateral;  . CYSTOSCOPY W/ RETROGRADES Bilateral 10/12/2016   Procedure: CYSTOSCOPY WITH RETROGRADE PYELOGRAM;  Surgeon: Hollice Espy, MD;  Location: [facility];  Service: Urology;  Laterality: Bilateral;  . CYSTOSCOPY W/ RETROGRADES Bilateral 06/09/2017   Procedure: CYSTOSCOPY WITH RETROGRADE PYELOGRAM;  Surgeon: Hollice Espy, MD;  Location: [facility];  Service: Urology;  Laterality: Bilateral;  . CYSTOSCOPY W/ RETROGRADES Bilateral 10/07/2017   Procedure: CYSTOSCOPY WITH RETROGRADE PYELOGRAM;  Surgeon: Hollice Espy, MD;  Location: [facility];  Service: Urology;  Laterality: Bilateral;  . CYSTOSCOPY W/ URETERAL STENT PLACEMENT Left 05/12/2016   Procedure: CYSTOSCOPY WITH STENT REPLACEMENT;  Surgeon: Hollice Espy, MD;  Location: [facility];  Service: Urology;  Laterality: Left;  . CYSTOSCOPY W/ URETERAL STENT PLACEMENT Left 10/12/2016   Procedure: CYSTOSCOPY WITH STENT REPLACEMENT;  Surgeon: Hollice Espy, MD;  Location: [facility];  Service: Urology;  Laterality: Left;  . CYSTOSCOPY WITH BIOPSY N/A 06/09/2017   Procedure: CYSTOSCOPY WITH BLADDER  BIOPSY;  Surgeon: Hollice Espy, MD;  Location: [facility];  Service: Urology;  Laterality: N/A;  . CYSTOSCOPY WITH STENT PLACEMENT Left 01/21/2016   Procedure: CYSTOSCOPY WITH double J STENT PLACEMENT;  Surgeon: Franchot Gallo, MD;  Location: [facility];  Service: Urology;  Laterality: Left;  . CYSTOSCOPY WITH STENT PLACEMENT Right 10/12/2016   Procedure: CYSTOSCOPY WITH STENT PLACEMENT;  Surgeon: Hollice Espy, MD;  Location: [facility];  Service: Urology;  Laterality: Right;  . DIALYSIS/PERMA CATHETER INSERTION N/A 11/20/2016   Procedure: Dialysis/Perma Catheter Insertion;  Surgeon: Algernon Huxley, MD;  Location: [place] [facility];  Service: Cardiovascular;  Laterality: N/A;  . DIALYSIS/PERMA CATHETER REMOVAL N/A 03/30/2017   Procedure: DIALYSIS/PERMA CATHETER REMOVAL;  Surgeon: Katha Cabal, MD;  Location: [place] [facility];  Service: Cardiovascular;  Laterality: N/A;  . KIDNEY SURGERY  01/21/2016   IR NEPHROSTOMY PLACEMENT LEFT   . PERIPHERAL VASCULAR CATHETERIZATION N/A 04/07/2016   Procedure: Glori Luis Cath Insertion;  Surgeon: Katha Cabal, MD;  Location: [place] [facility];  Service: Cardiovascular;  Laterality: N/A;  . PERIPHERAL VASCULAR THROMBECTOMY Left 06/02/2017   Procedure: PERIPHERAL VASCULAR THROMBECTOMY;  Surgeon: Algernon Huxley, MD;  Location: [place] [facility];  Service: Cardiovascular;  Laterality:  Left;  . PERIPHERAL VASCULAR THROMBECTOMY Left 01/28/2018   Procedure: PERIPHERAL VASCULAR THROMBECTOMY;  Surgeon: Katha Cabal, MD;  Location: [place] [facility];  Service: Cardiovascular;  Laterality: Left;  . PERIPHERAL VASCULAR THROMBECTOMY Left 03/14/2018   Procedure: PERIPHERAL VASCULAR THROMBECTOMY;  Surgeon: Algernon Huxley, MD;  Location: [place] [facility];  Service: Cardiovascular;  Laterality: Left;  . PERIPHERAL VASCULAR THROMBECTOMY Left 03/16/2018   Procedure: PERIPHERAL VASCULAR THROMBECTOMY;  Surgeon: Katha Cabal, MD;  Location: [place] [facility];  Service: Cardiovascular;  Laterality: Left;  . PORTACATH PLACEMENT Right   . ROTATOR CUFF REPAIR Left   . TRANSURETHRAL RESECTION OF BLADDER TUMOR N/A 02/17/2016   Procedure: TRANSURETHRAL RESECTION OF BLADDER TUMOR (TURBT)-LARGE;  Surgeon: Hollice Espy, MD;  Location: [facility];  Service: Urology;  Laterality: N/A;  . TRANSURETHRAL RESECTION OF BLADDER TUMOR N/A 10/07/2017   Procedure: TRANSURETHRAL RESECTION OF BLADDER TUMOR (TURBT)-small;  Surgeon: Hollice Espy, MD;  Location: [facility];  Service: Urology;  Laterality: N/A;  . URETEROSCOPY Left 02/17/2016   Procedure: URETEROSCOPY;  Surgeon: Hollice Espy, MD;  Location: [facility];  Service: Urology;  Laterality: Left;  . URETEROSCOPY Right 10/12/2016   Procedure: URETEROSCOPY;  Surgeon: Hollice Espy, MD;  Location: [facility];  Service: Urology;  Laterality: Right;    Prior to Admission medications   Medication Sig Start Date End Date Taking? Authorizing Provider  ACCU-CHEK AVIVA PLUS test strip 1 each by Other route daily. use as directed 01/26/17   [provider]  ACCU-CHEK SOFTCLIX LANCETS lancets INJECT 1 EACH AS DIRECTED ONCE DAILY. 05/28/17   [provider]  acetaminophen (TYLENOL) 500 MG tablet Take 1,000 mg by mouth 2 (two) times daily as needed for moderate pain or headache.     [provider]  Albuterol Sulfate (PROAIR RESPICLICK) 347 (90 Base) MCG/ACT AEPB Inhale 2 puffs into the lungs 4 (four) times daily as needed (wheezing).     [provider]  atorvastatin (LIPITOR) 40 MG tablet Take 40 mg by mouth daily.  11/01/16   [provider]  Blood Glucose Monitoring Suppl (ACCU-CHEK AVIVA PLUS) w/Device KIT (USE TO MONITOR BLOOD SUGARS.) 12/30/17   [provider]  calcium acetate (PHOSLO) 667 MG capsule Take 2 capsules (1,334 mg total) by mouth 3 (three) times daily with meals. 11/23/16   Fritzi Mandes, MD  cholecalciferol (VITAMIN D) 1000 units tablet Take 1,000 Units by mouth  daily.    [provider]  esomeprazole (NEXIUM) 40 MG capsule Take 40 mg by mouth daily after breakfast.     [provider]  ferrous sulfate 325 (65 FE) MG EC tablet Take 325 mg by mouth daily after breakfast.     [provider]  furosemide (LASIX) 40 MG tablet Take 40 mg by mouth 2 (two) times daily. Take morning dose every day. Lunch dose as needed.    [provider]  gabapentin (NEURONTIN) 300 MG capsule Take 600 mg by mouth 3 (three) times daily.    [provider]  lidocaine (XYLOCAINE) 5 % ointment Apply 1 application topically 2 (two) times daily as needed for mild pain.  05/21/16   [provider]  lidocaine-prilocaine (EMLA) cream APPLY TO AFFECTED AREA ONCE 03/22/18   [provider]  NON FORMULARY Place 1 Units into the nose at bedtime. CPAP Time of use 2100-0600    [provider]  oxybutynin (DITROPAN) 5 MG tablet Take 5 mg by mouth 3 (three) times daily.  [provider]  OXYGEN Inhale 2 L into the lungs at bedtime.    [provider]    Allergies  Allergen Reactions  . Glipizide Er Other (See Comments)    Hypoglycemia   . Percocet [Oxycodone-Acetaminophen] Hives    Family History  Problem Relation Age of Onset  . Diabetes Mother   . Cancer Father   . Breast cancer Neg Hx   . Kidney disease Neg Hx     Social History Social History   Tobacco Use  . Smoking status: Former Smoker    Types: Cigarettes    Last attempt to quit: 07/20/2002    Years since quitting: 15.8  . Smokeless tobacco: Never Used  . Tobacco comment: 01/22/2016   "  quit smoking many years ago "  Substance Use Topics  . Alcohol use: No  . Drug use: No    Review of Systems Constitutional: Negative for fever. Cardiovascular: Negative for chest pain. Respiratory: Negative for shortness of breath. Gastrointestinal: Negative for abdominal pain, vomiting Genitourinary: Creates a small amount of urine each  day. Musculoskeletal: Negative for musculoskeletal complaints Skin: Negative for skin complaints  Neurological: Negative for headache All other ROS negative  ____________________________________________   PHYSICAL EXAM:  VITAL SIGNS: ED Triage Vitals  Enc Vitals Group     BP 05/10/18 1118 (!) 178/62     Pulse Rate 05/10/18 1118 77     Resp 05/10/18 1118 16     Temp 05/10/18 1118 97.7 F (36.5 C)     Temp Source 05/10/18 1118 Oral     SpO2 05/10/18 1118 92 %     Weight 05/10/18 1119 188 lb (85.3 kg)     Height 05/10/18 1119 5' 8"  (1.727 m)     Head Circumference --      Peak Flow --      Pain Score 05/10/18 1119 0     Pain Loc --      Pain Edu? --      Excl. in [place]? --    Constitutional: Alert and oriented. Well appearing and in no distress. Eyes: Normal exam ENT   Head: Normocephalic and atraumatic.   Mouth/Throat: Mucous membranes are moist. Cardiovascular: Normal rate, regular rhythm. No murmur Respiratory: Normal respiratory effort without tachypnea nor retractions. Breath sounds are clear Gastrointestinal: Soft and nontender. No distention.  Musculoskeletal: Patient has sutures in the left upper extremity in the area of her AV fistula.  No palpable thrill, no bruit auscultated.  Largely nontender. Neurologic:  Normal speech and language. No gross focal neurologic deficits  Skin:  Skin is warm, dry and intact.  Psychiatric: Mood and affect are normal.  ____________________________________________   INITIAL IMPRESSION / ASSESSMENT AND PLAN / ED COURSE  Pertinent labs & imaging results that were available during my care of the patient were reviewed by me and considered in my medical decision making (see chart for details).  Patient presents to the emergency department for a clotted left AV fistula.  He has no other complaints at this time.  Patient's chemistry does show a potassium of 4.5 which is encouraging as the patient has not received dialysis since 6  days ago.  We are awaiting to speak to vascular surgery for further recommendations.  I discussed the patient with vascular surgery they would like the patient admitted to the hospitalist service.  Will possibly need temporary catheter placement and declotting procedure done within the next several days.  Patient agreeable to plan of care.  Finally able to obtain a CBC drawn from the patient's port however the results are grossly abnormal.  I have asked the nurse to redraw a CBC from a straight stick. ____________________________________________   FINAL CLINICAL IMPRESSION(S) / ED DIAGNOSES  Clotted AV fistula    Harvest Dark, MD 05/10/18 1818    Harvest Dark, MD 05/10/18 2051

## 2018-05-11 ENCOUNTER — Inpatient Hospital Stay
Admission: EM | Disposition: A | Discharge status: Home Health | Payer: Self-pay | Source: Home / Self Care | Attending: Internal Medicine

## 2018-05-11 HISTORY — PX: TEMPORARY DIALYSIS CATHETER: CATH118312

## 2018-05-11 LAB — HEMOGLOBIN A1C
Hgb A1c MFr Bld: 6.2 % — ABNORMAL HIGH (ref 4.8–5.6)
Mean Plasma Glucose: 131.24 mg/dL

## 2018-05-11 LAB — GLUCOSE, CAPILLARY
GLUCOSE-CAPILLARY: 94 mg/dL (ref 70–99)
GLUCOSE-CAPILLARY: 91 mg/dL (ref 70–99)
GLUCOSE-CAPILLARY: 95 mg/dL (ref 70–99)
Glucose-Capillary: 96 mg/dL (ref 70–99)

## 2018-05-11 LAB — BASIC METABOLIC PANEL
Anion gap: 16 — ABNORMAL HIGH (ref 5–15)
BUN: 90 mg/dL — ABNORMAL HIGH (ref 8–23)
CHLORIDE: 109 mmol/L (ref 98–111)
CO2: 17 mmol/L — ABNORMAL LOW (ref 22–32)
CREATININE: 14.58 mg/dL — AB (ref 0.44–1.00)
Calcium: 7.8 mg/dL — ABNORMAL LOW (ref 8.9–10.3)
GFR calc Af Amer: 2 mL/min — ABNORMAL LOW (ref >60)
GFR calc non Af Amer: 2 mL/min — ABNORMAL LOW (ref >60)
Glucose, Bld: 95 mg/dL (ref 70–99)
Potassium: 4.7 mmol/L (ref 3.5–5.1)
SODIUM: 142 mmol/L (ref 135–145)

## 2018-05-11 LAB — CBC
HEMATOCRIT: 37.5 % (ref 36.0–46.0)
HEMOGLOBIN: 11.5 g/dL — AB (ref 12.0–15.0)
MCH: 30.8 pg (ref 26.0–34.0)
MCHC: 30.7 g/dL (ref 30.0–36.0)
MCV: 100.5 fL — AB (ref 80.0–100.0)
NRBC: 0 % (ref 0.0–0.2)
Platelets: 181 10*3/uL (ref 150–400)
RBC: 3.73 MIL/uL — AB (ref 3.87–5.11)
RDW: 15.5 % (ref 11.5–15.5)
WBC: 5 10*3/uL (ref 4.0–10.5)

## 2018-05-11 LAB — MRSA PCR SCREENING: MRSA by PCR: POSITIVE — AB

## 2018-05-11 SURGERY — TEMPORARY DIALYSIS CATHETER
Anesthesia: Moderate Sedation

## 2018-05-11 MED ORDER — CHLORHEXIDINE GLUCONATE CLOTH 2 % EX PADS
6.0000 | MEDICATED_PAD | Freq: Every day | CUTANEOUS | Status: DC
  Administered 2018-05-11 – 2018-05-13 (×3): 6 via TOPICAL

## 2018-05-11 MED ORDER — TRAMADOL HCL 50 MG PO TABS
50.0000 mg | ORAL_TABLET | Freq: Two times a day (BID) | ORAL | Status: DC | PRN
  Administered 2018-05-12: 50 mg via ORAL
  Filled 2018-05-11: qty 1

## 2018-05-11 MED ORDER — CINACALCET HCL 30 MG PO TABS
30.0000 mg | ORAL_TABLET | Freq: Every day | ORAL | Status: DC
  Administered 2018-05-11 – 2018-05-13 (×3): 30 mg via ORAL
  Filled 2018-05-11 (×3): qty 1

## 2018-05-11 MED ORDER — LATANOPROST 0.005 % OP SOLN
1.0000 [drp] | Freq: Every day | OPHTHALMIC | Status: DC
  Administered 2018-05-11: 1 [drp] via OPHTHALMIC
  Filled 2018-05-11: qty 2.5

## 2018-05-11 MED ORDER — CALCIUM ACETATE (PHOS BINDER) 667 MG PO CAPS
1334.0000 mg | ORAL_CAPSULE | Freq: Three times a day (TID) | ORAL | Status: DC
  Administered 2018-05-11 – 2018-05-13 (×5): 1334 mg via ORAL
  Filled 2018-05-11 (×5): qty 2

## 2018-05-11 MED ORDER — GABAPENTIN 100 MG PO CAPS
100.0000 mg | ORAL_CAPSULE | Freq: Every day | ORAL | Status: DC
  Administered 2018-05-12: 100 mg via ORAL
  Filled 2018-05-11: qty 1

## 2018-05-11 MED ORDER — FUROSEMIDE 40 MG PO TABS
40.0000 mg | ORAL_TABLET | Freq: Two times a day (BID) | ORAL | Status: DC
  Administered 2018-05-12 – 2018-05-13 (×3): 40 mg via ORAL
  Filled 2018-05-11 (×3): qty 1

## 2018-05-11 MED ORDER — GABAPENTIN 400 MG PO CAPS
400.0000 mg | ORAL_CAPSULE | Freq: Three times a day (TID) | ORAL | Status: DC
  Administered 2018-05-11: 400 mg via ORAL
  Filled 2018-05-11: qty 1

## 2018-05-11 MED ORDER — MIRABEGRON ER 25 MG PO TB24
25.0000 mg | ORAL_TABLET | Freq: Every day | ORAL | Status: DC
  Administered 2018-05-11: 25 mg via ORAL
  Filled 2018-05-11: qty 1

## 2018-05-11 MED ORDER — OXYBUTYNIN CHLORIDE 5 MG PO TABS
5.0000 mg | ORAL_TABLET | Freq: Three times a day (TID) | ORAL | Status: DC
  Administered 2018-05-11 – 2018-05-13 (×7): 5 mg via ORAL
  Filled 2018-05-11 (×7): qty 1

## 2018-05-11 MED ORDER — PANTOPRAZOLE SODIUM 40 MG PO TBEC
40.0000 mg | DELAYED_RELEASE_TABLET | Freq: Every day | ORAL | Status: DC
  Administered 2018-05-11 – 2018-05-13 (×3): 40 mg via ORAL
  Filled 2018-05-11 (×3): qty 1

## 2018-05-11 MED ORDER — FERROUS SULFATE 325 (65 FE) MG PO TABS
325.0000 mg | ORAL_TABLET | Freq: Every day | ORAL | Status: DC
  Administered 2018-05-11 – 2018-05-13 (×3): 325 mg via ORAL
  Filled 2018-05-11 (×3): qty 1

## 2018-05-11 MED ORDER — ATORVASTATIN CALCIUM 20 MG PO TABS
40.0000 mg | ORAL_TABLET | Freq: Every day | ORAL | Status: DC
  Administered 2018-05-12 – 2018-05-13 (×2): 40 mg via ORAL
  Filled 2018-05-11 (×2): qty 2

## 2018-05-11 MED ORDER — MUPIROCIN 2 % EX OINT
1.0000 "application " | TOPICAL_OINTMENT | Freq: Two times a day (BID) | CUTANEOUS | Status: DC
  Administered 2018-05-11 – 2018-05-13 (×5): 1 via NASAL
  Filled 2018-05-11: qty 22

## 2018-05-11 MED ORDER — LIDOCAINE HCL (PF) 1 % IJ SOLN
INTRAMUSCULAR | Status: DC | PRN
  Administered 2018-05-11: 5 mL via INTRADERMAL

## 2018-05-11 SURGICAL SUPPLY — 1 items: KIT DIALYSIS CATH TRI 30X13 (CATHETERS) ×3 IMPLANT

## 2018-05-11 NOTE — Progress Notes (Signed)
HD Treatment Initiated    05/11/18 1757  Vital Signs  Pulse Rate (!) 27  Pulse Rate Source Monitor  Resp 20  Oxygen Therapy  SpO2 (!) 71 %  O2 Device Room Air  During Hemodialysis Assessment  Blood Flow Rate (mL/min) 400 mL/min  Arterial Pressure (mmHg) -210 mmHg  Venous Pressure (mmHg) 250 mmHg  Transmembrane Pressure (mmHg) 60 mmHg  Ultrafiltration Rate (mL/min) 650 mL/min  Dialysate Flow Rate (mL/min) 600 ml/min  Conductivity: Machine  13.6  HD Safety Checks Performed Yes  Dialysis Fluid Bolus Normal Saline  Bolus Amount (mL) 250 mL  Intra-Hemodialysis Comments Tx initiated

## 2018-05-11 NOTE — Progress Notes (Signed)
[facility] [place]: Barbara [name]    MR#:  [id number]  DATE OF BIRTH:  1941/03/22  SUBJECTIVE:  CHIEF COMPLAINT:   Chief Complaint  [name] presents with  . Clotted Dialysis Access  no complaints, pleasantly confused. Wants to eat REVIEW OF SYSTEMS:  Review of Systems  Constitutional: Negative for chills, fever and weight loss.  HENT: Negative for nosebleeds and sore throat.   Eyes: Negative for blurred vision.  Respiratory: Negative for cough, shortness of breath and wheezing.   Cardiovascular: Negative for chest pain, orthopnea, leg swelling and PND.  Gastrointestinal: Negative for abdominal pain, constipation, diarrhea, heartburn, nausea and vomiting.  Genitourinary: Negative for dysuria and urgency.  Musculoskeletal: Negative for back pain.  Skin: Negative for rash.  Neurological: Negative for dizziness, speech change, focal weakness and headaches.  Endo/Heme/Allergies: Does not bruise/bleed easily.  Psychiatric/Behavioral: Negative for depression.    DRUG ALLERGIES:   Allergies  Allergen Reactions  . Glipizide Er Other (See Comments)    Hypoglycemia   . Percocet [Oxycodone-Acetaminophen] Hives   VITALS:  Blood pressure (!) 154/70, pulse 74, temperature 98.2 F (36.8 C), temperature source Oral, resp. rate 16, height 5' 8"  (1.727 m), weight 85.3 kg, SpO2 92 %. PHYSICAL EXAMINATION:  Physical Exam  Constitutional: She is oriented to person, place, and time.  HENT:  Head: Normocephalic and atraumatic.  Eyes: Pupils are equal, round, and reactive to light. Conjunctivae and EOM are normal.  Neck: Normal range of motion. Neck supple. No tracheal deviation present. No thyromegaly present.  Cardiovascular: Normal rate, regular rhythm and normal heart sounds.  Pulmonary/Chest: Effort normal and breath sounds normal. No respiratory distress. She has no wheezes. She exhibits no tenderness.  Abdominal: Soft. Bowel sounds are  normal. She exhibits no distension. There is no tenderness.  Musculoskeletal: Normal range of motion.  Neurological: She is alert and oriented to person, place, and time. No cranial nerve deficit.  Skin: Skin is warm and dry. No rash noted.   LABORATORY PANEL:  Female CBC Recent Labs  Lab 05/11/18 0444  WBC 5.0  HGB 11.5*  HCT 37.5  PLT 181   ------------------------------------------------------------------------------------------------------------------ Chemistries  Recent Labs  Lab 05/11/18 0444  NA 142  K 4.7  CL 109  CO2 17*  GLUCOSE 95  BUN 90*  CREATININE 14.58*  CALCIUM 7.8*   RADIOLOGY:  Dg Chest 2 View  Result Date: 05/10/2018 CLINICAL DATA:  Short of breath, weakness EXAM: CHEST - 2 VIEW COMPARISON:  05/11/2017 FINDINGS: No pleural effusion. Left brachiocephalic region stent. Right-sided central venous port tip over the SVC origin. Mild cardiomegaly with central vascular congestion. No focal consolidation. Aortic atherosclerosis. No pneumothorax. Mild bronchitic changes. IMPRESSION: Borderline to mild cardiomegaly with slight central congestion. No overt pulmonary edema or pleural effusion. Electronically Signed   By: Donavan Foil M.D.   On: 05/10/2018 19:48   ASSESSMENT AND PLAN:  77 y.o. female with a known history of hypertension, diabetes mellitus, ESRD has been having issues with her AV fistula. The [name] was sent here because they were unable to cannulate the left arm AV graft this morning  * ESRD with AVF dysfunction - Vascular planning for a temporary catheter to be placed and to reestablish her dialysis access.  Subsequently the thrombectomy can be performed.   * Metabolic acidosis Bicarbonate 11 -> 17 s/p bicarb drip.  *Hypertension.  Continue home medication  *Diabetes mellitus.  Sliding scale insulin for now  All the records are reviewed and case discussed with Care Management/Social Worker. Management plans discussed with the  [name], family (husband at bedside) and they are in agreement.  CODE STATUS: Full Code  TOTAL TIME TAKING CARE OF THIS [name]: 20 minutes.   More than 50% of the time was spent in counseling/coordination of care: YES  POSSIBLE D/C IN 1-2 DAYS, DEPENDING ON CLINICAL CONDITION and vascular eval   Max Sane M.D on 05/11/2018 at 4:33 PM  Between 7am to 6pm - Pager - [phone]  After 6pm go to [email or web address] - Proofreader  [facility]  Office  [phone]  CC: Primary care physician; [name], MD  Note: This dictation was prepared with Dragon dictation along with smaller phrase technology. Any transcriptional errors that result from this process are unintentional.

## 2018-05-11 NOTE — Progress Notes (Signed)
[facility]  ROUNDING NOTE   Subjective:   [name] admitted to [facility] on 05/10/2018 for Cough [R05] Arteriovenous fistula occlusion, initial encounter [facility]) [T82.898A]  Husband at bedside.  Patient's AVF is not functioning. Last hemodialysis treatment was last Wednesday.   Objective:  Vital signs in last 24 hours:  Temp:  [97.7 F (36.5 C)-98.4 F (36.9 C)] 98.4 F (36.9 C) (10/23 1636) Pulse Rate:  [61-74] 68 (10/23 1636) Resp:  [16] 16 (10/22 2214) BP: (147-173)/(70-86) 157/77 (10/23 1636) SpO2:  [90 %-96 %] 96 % (10/23 1636)  Weight change:  Filed Weights   05/10/18 1119  Weight: 85.3 kg    Intake/Output: No intake/output data recorded.   Intake/Output this shift:  Total I/O In: 291.9 [I.V.:291.9] Out: 600 [Urine:600]  Physical Exam: General: NAD,   Head: Normocephalic, atraumatic. Moist oral mucosal membranes  Eyes: Anicteric, PERRL  Neck: Supple, trachea midline  Lungs:  Clear to auscultation  Heart: Regular rate and rhythm  Abdomen:  Soft, nontender,   Extremities: Trace  peripheral edema.  Neurologic: Nonfocal, moving all four extremities  Skin: No lesions  Access: Left AVF    Basic Metabolic Panel: Recent Labs  Lab 05/10/18 1122 05/11/18 0444  NA 142 142  K 4.5 4.7  CL 111 109  CO2 11* 17*  GLUCOSE 90 95  BUN 84* 90*  CREATININE 13.71* 14.58*  CALCIUM 7.6* 7.8*    Liver Function Tests: No results for input(s): AST, ALT, ALKPHOS, BILITOT, PROT, ALBUMIN in the last 168 hours. No results for input(s): LIPASE, AMYLASE in the last 168 hours. No results for input(s): AMMONIA in the last 168 hours.  CBC: Recent Labs  Lab 05/10/18 1817 05/10/18 2114 05/11/18 0444  WBC 2.3* 5.0 5.0  HGB 4.7* 11.7* 11.5*  HCT 16.2* 37.7 37.5  MCV 105.2* 98.7 100.5*  PLT 72* 177 181    Cardiac Enzymes: No results for input(s): CKTOTAL, CKMB, CKMBINDEX, TROPONINI in the last 168 hours.  BNP: Invalid input(s):  POCBNP  CBG: Recent Labs  Lab 05/10/18 2219 05/11/18 0739 05/11/18 1201 05/11/18 1615  GLUCAP 92 94 96 91    Microbiology: Results for orders placed or performed during the hospital encounter of 05/10/18  MRSA PCR Screening     Status: Abnormal   Collection Time: 05/11/18  5:02 AM  Result Value Ref Range Status   MRSA by PCR POSITIVE (A) NEGATIVE Final    Comment:        The GeneXpert MRSA Assay (FDA approved for NASAL specimens only), is one component of a comprehensive MRSA colonization surveillance program. It is not intended to diagnose MRSA infection nor to guide or monitor treatment for MRSA infections. RESULT CALLED TO, READ BACK BY AND VERIFIED WITH: LAUREN [email or web address]  ON 05/11/18 BY HKP Performed at [facility], [address] [place], [place] [address]     Coagulation Studies: No results for input(s): LABPROT, INR in the last 72 hours.  Urinalysis: No results for input(s): COLORURINE, LABSPEC, PHURINE, GLUCOSEU, HGBUR, BILIRUBINUR, KETONESUR, PROTEINUR, UROBILINOGEN, NITRITE, LEUKOCYTESUR in the last 72 hours.  Invalid input(s): APPERANCEUR    Imaging: Dg Chest 2 View  Result Date: 05/10/2018 CLINICAL DATA:  Short of breath, weakness EXAM: CHEST - 2 VIEW COMPARISON:  05/11/2017 FINDINGS: No pleural effusion. Left brachiocephalic region stent. Right-sided central venous port tip over the SVC origin. Mild cardiomegaly with central vascular congestion. No focal consolidation. Aortic atherosclerosis. No pneumothorax. Mild bronchitic changes. IMPRESSION: Borderline to mild cardiomegaly with slight  central congestion. No overt pulmonary edema or pleural effusion. Electronically Signed   By: Donavan Foil M.D.   On: 05/10/2018 19:48     Medications:   .  sodium bicarbonate  infusion 1000 mL 40 mL/hr at 05/11/18 1527   . [MAR Hold] atorvastatin  40 mg Oral Daily  . [MAR Hold] calcium acetate  1,334 mg Oral TID WC  . [MAR Hold] Chlorhexidine  Gluconate Cloth  6 each Topical Q0600  . [MAR Hold] cinacalcet  30 mg Oral Q breakfast  . [MAR Hold] ferrous sulfate  325 mg Oral QPC breakfast  . [MAR Hold] furosemide  40 mg Oral BID  . [MAR Hold] gabapentin  100 mg Oral QHS  . [MAR Hold] heparin  5,000 Units Subcutaneous Q8H  . [MAR Hold] insulin aspart  0-9 Units Subcutaneous TID WC  . [MAR Hold] latanoprost  1 drop Both Eyes QHS  . [MAR Hold] mupirocin ointment  1 application Nasal BID  . [MAR Hold] oxybutynin  5 mg Oral TID  . [MAR Hold] pantoprazole  40 mg Oral Daily   [MAR Hold] acetaminophen **OR** [MAR Hold] acetaminophen, [MAR Hold] albuterol, [MAR Hold] ondansetron **OR** [MAR Hold] ondansetron (ZOFRAN) IV, [MAR Hold] polyethylene glycol, [MAR Hold] traMADol  Assessment/ Plan:  Barbara [name] is a 77 y.o. black female with end stage renal disease on hemodialysis, hypertension, obesity, obstructive sleep apnea, GERD, hyperlipidemia, diabetes mellitus type II, coronary artery disease, COPD, celiac disease  [facility] M[facility] [place] Left AVF  1. End stage renal disease with complication of dialysis access Nonfunctioning AVF - Appreciate vascular input for catheter placement - dialysis for later today.   2. Hypertension: blood pressure at goal  - furosemide on nondialysis days.   3. Anemia of chronic kidney disease: hemoglobin 11.5 - EPO as outpatient  4. Secondary Hyperparathyroidism:  - calcium acetate with meals.    LOS: 1 Barbara [name] 10/23/20195:18 PM

## 2018-05-11 NOTE — Progress Notes (Signed)
HD Tx complete    05/11/18 2100  Hand-Off documentation  Report given to (Full Name) Adonis Huguenin, RN   Report received from (Full Name) Beatris Ship, RN   Vital Signs  Temp 98.5 F (36.9 C)  Temp Source Oral  Pulse Rate Source Monitor  BP Location Right Arm  BP Method Automatic  Patient Position (if appropriate) Lying  Oxygen Therapy  O2 Device Nasal Cannula  O2 Flow Rate (L/min) 2 L/min  During Hemodialysis Assessment  HD Safety Checks Performed Yes  KECN 60.5 [facility]  Dialysis Fluid Bolus Normal Saline  Bolus Amount (mL) 250 mL  Intra-Hemodialysis Comments Tx completed;Tolerated well  Post-Hemodialysis Assessment  Rinseback Volume (mL) 250 mL  KECN 60.5 V  Dialyzer Clearance Lightly streaked  Duration of HD Treatment -hour(s) 3 hour(s)  Hemodialysis Intake (mL) 500 mL  UF Total -Machine (mL) 2002 mL  Net UF (mL) 1502 mL  Tolerated HD Treatment Yes

## 2018-05-11 NOTE — Progress Notes (Signed)
Post HD assessment, pt tolerated tx, reports feeling better, D /C to floor.    05/11/18 2127  Neurological  Level of Consciousness Alert  Orientation Level Oriented to person;Oriented to place;Oriented to situation;Disoriented to time  Respiratory  Respiratory Pattern Regular;Unlabored  Chest Assessment Chest expansion symmetrical  Bilateral Breath Sounds Expiratory wheezes  Cough None  Cardiac  Pulse Regular  Heart Sounds S1, S2  Jugular Venous Distention (JVD) No  ECG Monitor Yes  Cardiac Rhythm NSR  Vascular  R Radial Pulse +2  L Radial Pulse +2  R Dorsalis Pedis Pulse +2  L Dorsalis Pedis Pulse +2  Edema Left upper extremity  LUE Edema +1  Integumentary  Integumentary (WDL) X  Skin Color Appropriate for ethnicity  Skin Condition Dry  Skin Integrity Surgical Incision (see LDA)  Musculoskeletal  Musculoskeletal (WDL) X  Generalized Weakness Yes  GU Assessment  Genitourinary (WDL) X (HD pt)  Psychosocial  Psychosocial (WDL) WDL

## 2018-05-11 NOTE — Progress Notes (Signed)
Verbal order from nephrology to hold PO lasix at this time. Pt updated.   Kirk Basquez [employer]

## 2018-05-11 NOTE — Evaluation (Signed)
Occupational Therapy Evaluation Patient Details Name: Barbara [name] MRN: [id number] DOB: 04/17/1941 Today's Date: 05/11/2018    History of Present Illness Barbara [name]  is a 77 y.o. female with a known history of hypertension, diabetes mellitus, incisional disease has been having issues with her AV fistula.  This clotted and declotting was attempted. After this dialysis could not be done again. Patient has not had hemodialysis for close to a week now. She does complain of cough but no shortness of breath.  Feels weak.  No nausea.  At this time she has been found to have metabolic acidosis with bicarb low at 11. Patient is being admitted to the hospital and will have her AV fistula procedure and have a temporary dialysis catheter placed for her ongoing dialysis needs.   Clinical Impression   Pt seen for OT evaluation this date. Prior to hospital admission, pt was independent for bathing, dressing and eating ADLs and required assistance from family for IADL tasks.  Pt lives with spouse in a one story house with 2 steps to enter.  Currently pt demonstrates impairments in strength, ROM, safety awareness, cardiopulmonary status, activity tolerance, knowledge of energy conservation strategies and decreased knowledge of of DME/AE requiring supervision for ADL tasks.  Pt would benefit from skilled OT to address noted impairments and functional limitations (see below for any additional details) in order to maximize safety and independence while minimizing falls risk and caregiver burden.  Upon hospital discharge, recommend pt discharge to [facility].    Follow Up Recommendations  Home health OT    Equipment Recommendations  Management consultant)    Recommendations for Other Services       Precautions / Restrictions Precautions Precautions: Fall Restrictions Weight Bearing Restrictions: No      Mobility Bed Mobility              Transfers         General transfer comment: Deferred; see PT eval     Balance                               ADL either performed or assessed with clinical judgement   ADL Overall ADL's : Needs assistance/impaired Eating/Feeding: Independent;Sitting   Grooming: Sitting;Modified independent Grooming Details (indicate cue type and reason): due to increased time and effort Upper Body Bathing: Supervision/ safety;Sitting   Lower Body Bathing: Sitting/lateral leans;Min guard   Upper Body Dressing : Sitting;Supervision/safety   Lower Body Dressing: Min guard;Sitting/lateral leans                       Vision Patient Visual Report: No change from baseline       Perception     Praxis      Pertinent Vitals/Pain Pain Assessment: 0-10 Pain Location: No number provided; pt experienced pain in RUE due to old injury during MMT and R hip pain.  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     Exercises Other Exercises Other Exercises: Pt educated in and provided handout regarding energy conservation strategies including activity pacing, prioritizing, home modifications, and routine modifications.  Other Exercises: Pt educated pursed lip breathing to improve safety and endurance with ADL/IADL tasks.    Shoulder Instructions      Home Living Family/patient expects to  be discharged to:: Private residence Living Arrangements: Spouse/significant other Available Help at Discharge: Family;Available 24 hours/day Type of Home: House Home Access: Stairs to enter [employer] of Steps: 2 Entrance Stairs-Rails: Left Home Layout: One level     Bathroom Shower/Tub: Teacher, early years/pre: Standard Bathroom Accessibility: Yes   Home Equipment: Environmental consultant - 4 wheels;Shower seat          Prior Functioning/Environment Level of Independence: Independent with assistive device(s)        Comments: Pt reports she uses a RW out in community. Husband drives. Pt reports she is independent with ADL tasks, husband/granddaughter report husband helps her with cooking and cleaning,        OT Problem List: Decreased strength;Decreased range of motion;Cardiopulmonary status limiting activity;Decreased safety awareness;Decreased activity tolerance;Decreased knowledge of use of DME or AE      OT Treatment/Interventions: Self-care/ADL training;DME and/or AE instruction;Therapeutic activities;Therapeutic exercise;Patient/family education;Energy conservation    OT Goals(Current goals can be found in the care plan section) Acute Rehab OT Goals Patient Stated Goal: Return to prior function OT Goal Formulation: With patient/family Time For Goal Achievement: 05/25/18 Potential to Achieve Goals: Good ADL Goals Pt Will Perform Lower Body Dressing: sitting/lateral leans;with adaptive equipment;with supervision Additional ADL Goal #1: Pt will utilize rest breaks and activity pacing strategies when completing ADL and IADL tasks. Additional ADL Goal #2: Pt will utilize pursed lip breathing when she becomes SOB while completing ADL and IADL tasks.  OT Frequency: Min 1X/week   Barriers to D/C:            Co-evaluation              AM-PAC PT "6 Clicks" Daily Activity     Outcome Measure Help from another person eating meals?: None Help from another  person taking care of personal grooming?: None Help from another person toileting, which includes using toliet, bedpan, or urinal?: A Little Help from another person bathing (including washing, rinsing, drying)?: None Help from another person to put on and taking off regular upper body clothing?: None Help from another person to put on and taking off regular lower body clothing?: A Little 6 Click Score: 22   End of Session    Activity Tolerance: Patient tolerated treatment well Patient left: in bed;with call bell/phone within reach;with bed alarm set;with family/visitor present  OT Visit Diagnosis: Other abnormalities of gait and mobility (R26.89);Muscle weakness (generalized) (M62.81)                Time: 9449-6759 OT Time Calculation (min): 30 min Charges:     Jadene Pierini OTS  05/11/2018, 4:57 PM

## 2018-05-11 NOTE — Progress Notes (Signed)
Physical Therapy Evaluation Patient Details Name: Barbara [name] MRN: [id number] DOB: 1940/12/06 Today's Date: 05/11/2018   History of Present Illness  Barbara [name]  is a 77 y.o. female with a known history of hypertension, diabetes mellitus, incisional disease has been having issues with her AV fistula.  This clotted and declotting was attempted. After this dialysis could not be done again. Patient has not had hemodialysis for close to a week now. She does complain of cough but no shortness of breath.  Feels weak.  No nausea.  At this time she has been found to have metabolic acidosis with bicarb low at 11. Patient is being admitted to the hospital and will have her AV fistula procedure and have a temporary dialysis catheter placed for her ongoing dialysis needs.  Clinical Impression  Pt admitted with above diagnosis. Pt currently with functional limitations due to the deficits listed below (see PT Problem List).  Pt is modified independent with bed mobility. CGA only for transfers and ambulation. Pt is able to ambulate out into hallway and then back to room. She reports fatigue and DOE. SaO2 drops to 84% on room air. Standing rest break provided but SaO2 never improves above 87%. Pt returns to room and eventually SaO2 improves to 89% sitting at EOB. RN notified. Pt is steady with ambulation using a single point cane but would likely be better with a walker. She will need intermittent support from family and [place] PT at discharge. Recommend use of rollator or walker at discharge. Pt will benefit from PT services to address deficits in strength, balance, and mobility in order to return to full function at home.     Follow Up Recommendations Home health PT;Supervision - Intermittent    Equipment Recommendations  None recommended by PT;Other (comment)(Use walker at discharge)    Recommendations for Other Services       Precautions / Restrictions Precautions Precautions: Fall Restrictions Weight  Bearing Restrictions: No      Mobility  Bed Mobility Overal bed mobility: Modified Independent             General bed mobility comments: Slow speed and use of bed rails  Transfers Overall transfer level: Needs assistance Equipment used: None Transfers: Sit to/from Stand Sit to Stand: Min guard         General transfer comment: Pt demonstrates safe hand placement but slow speed. She is steady when standing without UE support  Ambulation/Gait Ambulation/Gait assistance: Min guard Gait Distance (Feet): 40 Feet Assistive device: IV Pole       General Gait Details: Pt is able to ambulate out into hallway and then back to room. She reports fatigue and DOE. SaO2 drops to 84% on room air. Standing rest break provided but SaO2 never improves above 87%. Pt returns to room and eventually SaO2 improves to 89% sitting at EOB. RN notified. Pt is steady with ambulation using a single point cane but would likely be better with a walker  Stairs            Wheelchair Mobility    Modified Rankin (Stroke Patients Only)       Balance Overall balance assessment: Needs assistance Sitting-balance support: No upper extremity supported Sitting balance-Leahy Scale: Good     Standing balance support: No upper extremity supported Standing balance-Leahy Scale: Fair Standing balance comment: Requires UE support to place feet together and maintain  Pertinent Vitals/Pain Pain Assessment: No/denies pain    Home Living Family/patient expects to be discharged to:: Private residence Living Arrangements: Spouse/significant other Available Help at Discharge: Family Type of Home: House Home Access: Stairs to enter Entrance Stairs-Rails: Left Entrance Stairs-Number of Steps: 2 Home Layout: One level Home Equipment: Walker - 2 wheels;Walker - 4 wheels;Cane - single point;Wheelchair - manual(no grab bars, O2 at night)      Prior Function Level  of Independence: Independent with assistive device(s)         Comments: Pt reports that normally she ambulates with single point cane. Per medical record she mostly stays at home but will go out for groceries or to [place]. Husband drives. Reports that she is independent with ADLs/IADLs.     Hand Dominance   Dominant Hand: Right    Extremity/Trunk Assessment   Upper Extremity Assessment Upper Extremity Assessment: Defer to OT evaluation    Lower Extremity Assessment Lower Extremity Assessment: Generalized weakness       Communication   Communication: No difficulties  Cognition Arousal/Alertness: Awake/alert Behavior During Therapy: WFL for tasks assessed/performed Overall Cognitive Status: Within Functional Limits for tasks assessed                                        General Comments      Exercises     Assessment/Plan    PT Assessment Patient needs continued PT services  PT Problem List Decreased strength;Decreased activity tolerance;Decreased balance;Decreased mobility;Cardiopulmonary status limiting activity       PT Treatment Interventions DME instruction;Gait training;Stair training;Functional mobility training;Therapeutic activities;Therapeutic exercise;Balance training;Neuromuscular re-education;Patient/family education    PT Goals (Current goals can be found in the Care Plan section)  Acute Rehab PT Goals Patient Stated Goal: Return to prior function PT Goal Formulation: With patient/family Time For Goal Achievement: 05/25/18 Potential to Achieve Goals: Good    Frequency Min 2X/week   Barriers to discharge        Co-evaluation               AM-PAC PT "6 Clicks" Daily Activity  Outcome Measure Difficulty turning over in bed (including adjusting bedclothes, sheets and blankets)?: A Little Difficulty moving from lying on back to sitting on the side of the bed? : A Little Difficulty sitting down on and standing up from a  chair with arms (e.g., wheelchair, bedside commode, etc,.)?: A Little Help needed moving to and from a bed to chair (including a wheelchair)?: A Little Help needed walking in hospital room?: A Little Help needed climbing 3-5 steps with a railing? : A Little 6 Click Score: 18    End of Session Equipment Utilized During Treatment: Gait belt Activity Tolerance: Patient tolerated treatment well Patient left: in bed;with call bell/phone within reach;with bed alarm set;with family/visitor present Nurse Communication: Mobility status PT Visit Diagnosis: Unsteadiness on feet (R26.81);Muscle weakness (generalized) (M62.81);Difficulty in walking, not elsewhere classified (R26.2)    Time: 1420-1440 PT Time Calculation (min) (ACUTE ONLY): 20 min   Charges:   PT Evaluation $PT Eval Low Complexity: 1 Low          Kalob Bergen D Shaniah Baltes PT, DPT, GCS   Talecia Sherlin 05/11/2018, 4:25 PM

## 2018-05-11 NOTE — Progress Notes (Signed)
Pre HD Treatment  Pt from [facility], was desating prior to treatment. Put patient on 2L [place] and she is now at 98%.     05/11/18 1750  Vital Signs  Temp 98.4 F (36.9 C)  Temp Source Oral  Pulse Rate 73 (error)  Pulse Rate Source Monitor  Resp 20  BP (!) 149/79  BP Location Right Arm  BP Method Automatic  Patient Position (if appropriate) Lying  Oxygen Therapy  SpO2 (!) 74 %  O2 Device Room Air  Pain Assessment  Pain Scale 0-10  Pain Score 0  Dialysis Weight  Weight 87.5 kg  Type of Weight Pre-Dialysis  Time-Out for Hemodialysis  What Procedure? HD  Pt Identifiers(min of two) First/Last Name;MRN/Account#;Pt's DOB(use if MRN/Acct# not available  Correct Site? Yes  Correct Side? Yes  Correct Procedure? Yes  Consents Verified? Yes  Rad Studies Available? N/A  Safety Precautions Reviewed? Yes  Engineer, civil (consulting) Number  (3A)  Station Number 4  UF/Alarm Test Passed  Conductivity: Meter 14  Conductivity: Machine  13.7  pH 7.4  Reverse Osmosis Main  Normal Saline Lot Number [id number]  Dialyzer Lot Number [id number]  Disposable Set Lot Number [id number]  Machine Temperature 98.6 F (37 C)  Musician and Audible Yes  Blood Lines Intact and Secured Yes  Pre Treatment Patient Checks  Vascular access used during treatment Catheter  Hepatitis B Surface Antigen Results Negative  Date Hepatitis B Surface Antigen Drawn 05/02/18 (Drawn today)  Hepatitis B Surface Antibody  (Drawn today)  Date Hepatitis B Surface Antibody Drawn 05/02/18  Hemodialysis Consent Verified Yes  ECG (Telemetry) Monitor On Yes  Prime Ordered Normal Saline  Length of  DialysisTreatment -hour(s) 3 Hour(s)  Dialyzer Elisio 17H NR  Dialysate 3K, 2.5 Ca  Dialysis Anticoagulant None  Dialysate Flow Ordered 600  Blood Flow Rate Ordered 400 mL/min  Pre Treatment Labs Other (Comment)  Dialysis Blood Pressure Support Ordered Normal Saline  Education / Care Plan  Dialysis Education  Provided Yes  Documented Education in Care Plan Yes

## 2018-05-11 NOTE — Progress Notes (Signed)
HD Post Assessment    05/11/18 1750  Neurological  Level of Consciousness Alert  Orientation Level Oriented to person;Oriented to place;Oriented to situation;Disoriented to time  Respiratory  Respiratory Pattern Regular;Labored;Dyspnea with exertion;Symmetrical  Chest Assessment Chest expansion symmetrical  Bilateral Breath Sounds Expiratory wheezes  Cardiac  Pulse Regular  Heart Sounds S1, S2  Jugular Venous Distention (JVD) No  ECG Monitor Yes  Cardiac Rhythm NSR  Vascular  R Radial Pulse +2  L Radial Pulse +2  R Dorsalis Pedis Pulse +2  L Dorsalis Pedis Pulse +2  Edema Left upper extremity  LUE Edema +1  Integumentary  Integumentary (WDL) X  Skin Color Appropriate for ethnicity  Skin Condition Dry  Skin Integrity Surgical Incision (see LDA)  Musculoskeletal  Musculoskeletal (WDL) X  Generalized Weakness Yes  GU Assessment  Genitourinary (WDL) X (HD pt)  Psychosocial  Psychosocial (WDL) WDL

## 2018-05-11 NOTE — Op Note (Signed)
  OPERATIVE NOTE   PROCEDURE: 1. Insertion of temporary dialysis catheter catheter right femoral approach.  PRE-OPERATIVE DIAGNOSIS: Complication dialysis device with thrombosis of her arm access  POST-OPERATIVE DIAGNOSIS: Same  SURGEON: Katha Cabal M.D.  ANESTHESIA: 1% lidocaine local infiltration  ESTIMATED BLOOD LOSS: Minimal cc  INDICATIONS:   Barbara [name] is a 77 y.o. female who presents with thrombosis of her arm access.  She has not received dialysis for 7 days and therefore will undergo placement of a temporary catheter she will get caught up on her dialysis and will plan for thrombectomy.  Risks and benefits of been reviewed patient agrees to proceed..  DESCRIPTION: After obtaining full informed written consent, the patient was positioned supine. The right groin was prepped and draped in a sterile fashion. Ultrasound was placed in a sterile sleeve. Ultrasound was utilized to identify the right femoral vein which is noted to be echolucent and compressible indicating patency. Images recorded for the permanent record. Under real-time visualization a Seldinger needle is inserted into the vein and the guidewires advanced without difficulty. Small counterincision was made at the wire insertion site. Dilator is passed over the wire and the temporary dialysis catheter catheter is fed over the wire without difficulty.  All lumens aspirate and flush easily and are packed with heparin saline. Catheter secured to the skin of the right thigh with 2-0 silk. A sterile dressing is applied with Biopatch.  COMPLICATIONS: None  CONDITION: Unchanged  [name] Office:  [phone] 05/11/2018, 5:28 PM

## 2018-05-12 ENCOUNTER — Encounter: Payer: Self-pay | Admitting: Vascular Surgery

## 2018-05-12 ENCOUNTER — Inpatient Hospital Stay
Admission: EM | Disposition: A | Discharge status: Home Health | Payer: Self-pay | Source: Home / Self Care | Attending: Internal Medicine

## 2018-05-12 DIAGNOSIS — Z992 Dependence on renal dialysis: Secondary | ICD-10-CM

## 2018-05-12 DIAGNOSIS — N186 End stage renal disease: Secondary | ICD-10-CM

## 2018-05-12 DIAGNOSIS — T82868A Thrombosis of vascular prosthetic devices, implants and grafts, initial encounter: Secondary | ICD-10-CM

## 2018-05-12 HISTORY — PX: A/V SHUNT INTERVENTION: CATH118220

## 2018-05-12 LAB — CBC
HEMATOCRIT: 36.3 % (ref 36.0–46.0)
Hemoglobin: 11.4 g/dL — ABNORMAL LOW (ref 12.0–15.0)
MCH: 30.4 pg (ref 26.0–34.0)
MCHC: 31.4 g/dL (ref 30.0–36.0)
MCV: 96.8 fL (ref 80.0–100.0)
PLATELETS: 140 10*3/uL — AB (ref 150–400)
RBC: 3.75 MIL/uL — ABNORMAL LOW (ref 3.87–5.11)
RDW: 15.3 % (ref 11.5–15.5)
WBC: 3.8 10*3/uL — ABNORMAL LOW (ref 4.0–10.5)
nRBC: 0 % (ref 0.0–0.2)

## 2018-05-12 LAB — BASIC METABOLIC PANEL
ANION GAP: 12 (ref 5–15)
BUN: 41 mg/dL — ABNORMAL HIGH (ref 8–23)
CO2: 28 mmol/L (ref 22–32)
Calcium: 8 mg/dL — ABNORMAL LOW (ref 8.9–10.3)
Chloride: 99 mmol/L (ref 98–111)
Creatinine, Ser: 8.34 mg/dL — ABNORMAL HIGH (ref 0.44–1.00)
GFR calc Af Amer: 5 mL/min — ABNORMAL LOW (ref >60)
GFR, EST NON AFRICAN AMERICAN: 4 mL/min — AB (ref >60)
GLUCOSE: 93 mg/dL (ref 70–99)
POTASSIUM: 3.3 mmol/L — AB (ref 3.5–5.1)
Sodium: 139 mmol/L (ref 135–145)

## 2018-05-12 LAB — GLUCOSE, CAPILLARY
Glucose-Capillary: 98 mg/dL (ref 70–99)
Glucose-Capillary: 104 mg/dL — ABNORMAL HIGH (ref 70–99)
Glucose-Capillary: 101 mg/dL — ABNORMAL HIGH (ref 70–99)
Glucose-Capillary: 108 mg/dL — ABNORMAL HIGH (ref 70–99)

## 2018-05-12 SURGERY — A/V SHUNT INTERVENTION
Anesthesia: Moderate Sedation

## 2018-05-12 MED ORDER — CEFAZOLIN SODIUM-DEXTROSE 1-4 GM/50ML-% IV SOLN
1.0000 g | INTRAVENOUS | Status: AC
  Filled 2018-05-12: qty 50

## 2018-05-12 MED ORDER — HEPARIN (PORCINE) IN NACL 1000-0.9 UT/500ML-% IV SOLN
INTRAVENOUS | Status: AC
  Filled 2018-05-12: qty 1000

## 2018-05-12 MED ORDER — CEFAZOLIN SODIUM-DEXTROSE 1-4 GM/50ML-% IV SOLN
1.0000 g | INTRAVENOUS | Status: DC
  Filled 2018-05-12: qty 50

## 2018-05-12 MED ORDER — ALTEPLASE 2 MG IJ SOLR
INTRAMUSCULAR | Status: AC
  Filled 2018-05-12: qty 4

## 2018-05-12 MED ORDER — SODIUM CHLORIDE 0.9 % IV SOLN
INTRAVENOUS | Status: AC | PRN
  Administered 2018-05-12: 250 mL via INTRAVENOUS

## 2018-05-12 MED ORDER — MIDAZOLAM HCL 2 MG/2ML IJ SOLN
INTRAMUSCULAR | Status: DC | PRN
  Administered 2018-05-12: 2 mg via INTRAVENOUS

## 2018-05-12 MED ORDER — ALTEPLASE 2 MG IJ SOLR
INTRAMUSCULAR | Status: DC | PRN
  Administered 2018-05-12: 4 mg

## 2018-05-12 MED ORDER — CEFAZOLIN SODIUM-DEXTROSE 2-4 GM/100ML-% IV SOLN: 2.0000 g | INTRAVENOUS | Status: DC

## 2018-05-12 MED ORDER — FENTANYL CITRATE (PF) 100 MCG/2ML IJ SOLN
INTRAMUSCULAR | Status: DC | PRN
  Administered 2018-05-12: 50 ug via INTRAVENOUS

## 2018-05-12 MED ORDER — HEPARIN SOD (PORK) LOCK FLUSH 100 UNIT/ML IV SOLN
INTRAVENOUS | Status: AC
  Filled 2018-05-12: qty 5

## 2018-05-12 MED ORDER — FENTANYL CITRATE (PF) 100 MCG/2ML IJ SOLN
INTRAMUSCULAR | Status: AC
  Filled 2018-05-12: qty 2

## 2018-05-12 MED ORDER — HEPARIN SODIUM (PORCINE) 1000 UNIT/ML IJ SOLN
INTRAMUSCULAR | Status: AC
  Filled 2018-05-12: qty 1

## 2018-05-12 MED ORDER — SODIUM CHLORIDE 0.9 % IV SOLN
INTRAVENOUS | Status: DC
  Administered 2018-05-12: 1000 mL via INTRAVENOUS

## 2018-05-12 MED ORDER — IOPAMIDOL (ISOVUE-300) INJECTION 61%
INTRAVENOUS | Status: DC | PRN
  Administered 2018-05-12: 40 mL via INTRA_ARTERIAL

## 2018-05-12 MED ORDER — CEFAZOLIN SODIUM-DEXTROSE 1-4 GM/50ML-% IV SOLN
INTRAVENOUS | Status: AC
  Filled 2018-05-12: qty 50

## 2018-05-12 MED ORDER — LIDOCAINE-EPINEPHRINE (PF) 1 %-1:200000 IJ SOLN
INTRAMUSCULAR | Status: AC
  Filled 2018-05-12: qty 30

## 2018-05-12 MED ORDER — POTASSIUM CHLORIDE CRYS ER 20 MEQ PO TBCR
40.0000 meq | EXTENDED_RELEASE_TABLET | Freq: Once | ORAL | Status: AC
  Administered 2018-05-12: 40 meq via ORAL
  Filled 2018-05-12: qty 2

## 2018-05-12 MED ORDER — HEPARIN SODIUM (PORCINE) 1000 UNIT/ML IJ SOLN
INTRAMUSCULAR | Status: DC | PRN
  Administered 2018-05-12: 4000 [IU] via INTRAVENOUS

## 2018-05-12 MED ORDER — MIDAZOLAM HCL 5 MG/5ML IJ SOLN
INTRAMUSCULAR | Status: AC
  Filled 2018-05-12: qty 5

## 2018-05-12 SURGICAL SUPPLY — 18 items
BALLN DORADO 10X40X80 (BALLOONS) ×3
BALLN DORADO 7X200X80 (BALLOONS) ×3
BALLN LUTONIX DCB 6X80X130 (BALLOONS) ×3
BALLOON DORADO 10X40X80 (BALLOONS) ×1 IMPLANT
BALLOON DORADO 7X200X80 (BALLOONS) ×1 IMPLANT
BALLOON LUTONIX DCB 6X80X130 (BALLOONS) ×1 IMPLANT
CANNULA 5F STIFF (CANNULA) ×3 IMPLANT
CATH BEACON 5 .035 40 KMP TP (CATHETERS) ×1 IMPLANT
CATH BEACON 5 .038 40 KMP TP (CATHETERS) ×2
CATH EMBOLECTOMY 5FR (BALLOONS) ×3 IMPLANT
DEVICE PRESTO INFLATION (MISCELLANEOUS) ×3 IMPLANT
DRAPE BRACHIAL (DRAPES) ×3 IMPLANT
PACK ANGIOGRAPHY (CUSTOM PROCEDURE TRAY) ×3 IMPLANT
SET AVX THROMB ULT (MISCELLANEOUS) ×3 IMPLANT
SHEATH BRITE TIP 6FRX5.5 (SHEATH) ×6 IMPLANT
SUT MNCRL AB 4-0 PS2 18 (SUTURE) ×3 IMPLANT
TOWEL OR 17X26 4PK STRL BLUE (TOWEL DISPOSABLE) ×3 IMPLANT
WIRE MAGIC TOR.035 180C (WIRE) ×6 IMPLANT

## 2018-05-12 NOTE — Progress Notes (Signed)
[facility]  ROUNDING NOTE   Subjective:  Patient did have temporary dialysis catheter placed and had hemodialysis performed with this yesterday. Additional vascular intervention to be performed today.    Objective:  Vital signs in last 24 hours:  Temp:  [98.1 F (36.7 C)-98.9 F (37.2 C)] 98.4 F (36.9 C) (10/24 1442) Pulse Rate:  [27-81] 69 (10/24 1615) Resp:  [15-21] 15 (10/24 1615) BP: (120-163)/(62-107) 139/69 (10/24 1615) SpO2:  [67 %-98 %] 93 % (10/24 1615) FiO2 (%):  [100 %] 100 % (10/24 1525) Weight:  [86.4 kg-87.5 kg] 86.4 kg (10/24 1442)  Weight change: 2.224 kg Filed Weights   05/11/18 1750 05/12/18 0500 05/12/18 1442  Weight: 87.5 kg 86.4 kg 86.4 kg    Intake/Output: I/O last 3 completed shifts: In: 291.9 [I.V.:291.9] Out: 2102 [Urine:600; Other:1502]   Intake/Output this shift:  No intake/output data recorded.  Physical Exam: General: NAD  Head: Normocephalic, atraumatic. Moist oral mucosal membranes  Eyes: Anicteric  Neck: Supple, trachea midline  Lungs:  Clear to auscultation  Heart: Regular rate and rhythm  Abdomen:  Soft, nontender  Extremities: Trace  peripheral edema.  Neurologic: Nonfocal, moving all four extremities  Skin: No lesions  Access: Left AVF, nonfunctional, RLE temporary femoral dialysis catheter    Basic Metabolic Panel: Recent Labs  Lab 05/10/18 1122 05/11/18 0444 05/12/18 0706  NA 142 142 139  K 4.5 4.7 3.3*  CL 111 109 99  CO2 11* 17* 28  GLUCOSE 90 95 93  BUN 84* 90* 41*  CREATININE 13.71* 14.58* 8.34*  CALCIUM 7.6* 7.8* 8.0*    Liver Function Tests: No results for input(s): AST, ALT, ALKPHOS, BILITOT, PROT, ALBUMIN in the last 168 hours. No results for input(s): LIPASE, AMYLASE in the last 168 hours. No results for input(s): AMMONIA in the last 168 hours.  CBC: Recent Labs  Lab 05/10/18 1817 05/10/18 2114 05/11/18 0444 05/12/18 0706  WBC 2.3* 5.0 5.0 3.8*  HGB 4.7* 11.7* 11.5* 11.4*  HCT  16.2* 37.7 37.5 36.3  MCV 105.2* 98.7 100.5* 96.8  PLT 72* 177 181 140*    Cardiac Enzymes: No results for input(s): CKTOTAL, CKMB, CKMBINDEX, TROPONINI in the last 168 hours.  BNP: Invalid input(s): POCBNP  CBG: Recent Labs  Lab 05/11/18 1201 05/11/18 1615 05/11/18 2142 05/12/18 0738 05/12/18 1115  GLUCAP 96 91 95 98 104*    Microbiology: Results for orders placed or performed during the hospital encounter of 05/10/18  MRSA PCR Screening     Status: Abnormal   Collection Time: 05/11/18  5:02 AM  Result Value Ref Range Status   MRSA by PCR POSITIVE (A) NEGATIVE Final    Comment:        The GeneXpert MRSA Assay (FDA approved for NASAL specimens only), is one component of a comprehensive MRSA colonization surveillance program. It is not intended to diagnose MRSA infection nor to guide or monitor treatment for MRSA infections. RESULT CALLED TO, READ BACK BY AND VERIFIED WITH: LAUREN [email or web address]@[email or web address]  ON 05/11/18 BY HKP Performed at [facility], [address] [place], [place] [address]     Coagulation Studies: No results for input(s): LABPROT, INR in the last 72 hours.  Urinalysis: No results for input(s): COLORURINE, LABSPEC, PHURINE, GLUCOSEU, HGBUR, BILIRUBINUR, KETONESUR, PROTEINUR, UROBILINOGEN, NITRITE, LEUKOCYTESUR in the last 72 hours.  Invalid input(s): APPERANCEUR    Imaging: Dg Chest 2 View  Result Date: 05/10/2018 CLINICAL DATA:  Short of breath, weakness EXAM: CHEST - 2 VIEW COMPARISON:  05/11/2017  FINDINGS: No pleural effusion. Left brachiocephalic region stent. Right-sided central venous port tip over the SVC origin. Mild cardiomegaly with central vascular congestion. No focal consolidation. Aortic atherosclerosis. No pneumothorax. Mild bronchitic changes. IMPRESSION: Borderline to mild cardiomegaly with slight central congestion. No overt pulmonary edema or pleural effusion. Electronically Signed   By: Donavan Foil M.D.   On:  05/10/2018 19:48     Medications:   . sodium chloride 1,000 mL (05/12/18 1458)  . [MAR Hold]  ceFAZolin (ANCEF) IV     . [MAR Hold] atorvastatin  40 mg Oral Daily  . [MAR Hold] calcium acetate  1,334 mg Oral TID WC  . [MAR Hold] Chlorhexidine Gluconate Cloth  6 each Topical Q0600  . [MAR Hold] cinacalcet  30 mg Oral Q breakfast  . [MAR Hold] ferrous sulfate  325 mg Oral QPC breakfast  . [MAR Hold] furosemide  40 mg Oral BID  . [MAR Hold] gabapentin  100 mg Oral QHS  . [MAR Hold] heparin  5,000 Units Subcutaneous Q8H  . [MAR Hold] insulin aspart  0-9 Units Subcutaneous TID WC  . [MAR Hold] latanoprost  1 drop Both Eyes QHS  . [MAR Hold] mupirocin ointment  1 application Nasal BID  . [MAR Hold] oxybutynin  5 mg Oral TID  . [MAR Hold] pantoprazole  40 mg Oral Daily   [MAR Hold] acetaminophen **OR** [MAR Hold] acetaminophen, [MAR Hold] albuterol, [MAR Hold] ondansetron **OR** [MAR Hold] ondansetron (ZOFRAN) IV, [MAR Hold] polyethylene glycol, [MAR Hold] traMADol  Assessment/ Plan:  [name] is a 77 y.o. black female with end stage renal disease on hemodialysis, hypertension, obesity, obstructive sleep apnea, GERD, hyperlipidemia, diabetes mellitus type II, coronary artery disease, COPD, celiac disease  [facility] M[facility] [place] Left AVF  1. End stage renal disease with complication of dialysis access Nonfunctioning AVF - temporary dialysis catheter was placed yesterday.  Additional vascular intervention today.  No acute indication for dialysis today.  We will plan for dialysis tomorrow  2. Hypertension: blood pressure at goal   3. Anemia of chronic kidney disease: hemoglobin currently 11.4.  Hold off on Epogen for now.  4. Secondary Hyperparathyroidism:  - Continue calcium acetate with meals.    LOS: 2 Ali Mclaurin 10/24/20194:35 PM

## 2018-05-12 NOTE — Op Note (Signed)
[facility]    OPERATIVE NOTE   PROCEDURE: 1.  Left brachial artery to axillary vein arteriovenous graft cannulation under ultrasound guidance in both a retrograde and then antegrade fashion crossing 2.  Left arm shuntogram and central venogram 3.  Catheter directed thrombolysis with 4 mg of TPA delivered with the AngioJet AVX catheter 4.  Fogarty embolectomy for residual arterial plug 5.  Percutaneous transluminal angioplasty of the graft and venous anastomosis with 7 mm 20 cm length high-pressure angioplasty balloon 6.  Percutaneous transluminal angioplasty of arterial anastomosis with 6 mm diameter by 8 cm length Lutonix drug-coated angioplasty balloon 7.  Percutaneous transluminal angioplasty of the axillary and subclavian vein with 10 mm diameter by 4 cm length angioplasty balloon  PRE-OPERATIVE DIAGNOSIS: 1. ESRD 2.  Thrombosed left brachial artery to axillary vein arteriovenous graft  POST-OPERATIVE DIAGNOSIS: same as above   SURGEON: Leotis Pain, MD  ANESTHESIA: local with Moderate Conscious Sedation for approximately 35 minutes using 2 mg of Versed and 50 Mcg of Fentanyl  ESTIMATED BLOOD LOSS: 10 cc  FINDING(S): 1. Thrombosed graft with some stenosis proximal to the previously placed stent across the venous anastomosis and into the axillary vein and an arterial plug as well as narrowing near the previous access site for a previous declot procedure  SPECIMEN(S):  None  CONTRAST: 40 cc  FLUORO TIME: 3.6 minutes  INDICATIONS: Patient is a 77 y.o.female who presents with a thrombosed left brachial artery to axillary vein arteriovenous graft.  The patient is scheduled for an attempted declot and shuntogram.  The patient is aware the risks include but are not limited to: bleeding, infection, thrombosis of the cannulated access, and possible anaphylactic reaction to the contrast.  The patient is aware of the risks of the procedure and elects to proceed  forward.  DESCRIPTION: After full informed written consent was obtained, the patient was brought back to the angiography suite and placed supine upon the angiography table.  The patient was connected to monitoring equipment. Moderate conscious sedation was administered with a face to face encounter with the patient throughout the procedure with my supervision of the RN administering medicines and monitoring the patient's vital signs, pulse oximetry, telemetry and mental status throughout from the start of the procedure until the patient was taken to the recovery room. The left arm was prepped and draped in the standard fashion for a percutaneous access intervention.  Under ultrasound guidance, the left brachial artery to axillary vein arteriovenous graft was cannulated with a micropuncture needle under direct ultrasound guidance due to the pulseless nature of the graft in both an antegrade and a retrograde fashion crossing, and permanent images were performed.  The microwire was advanced and the needle was exchanged for the a microsheath.  I then upsized to a 6 Fr Sheath and imaging was performed.  Hand injections were completed to image the access including the central venous system. This demonstrated no flow within the AV graft.  Based on the images, this patient will need extensive treatment to salvage the graft. I then gave the patient 4000 units of intravenous heparin.  I then placed a Magic torque wire into the brachial artery from the retrograde sheath and into the subclavian vein from the antegrade sheath. 4 mg of TPA were deployed throughout the entirety of the graft and into the axillary and subclavian vein. This was allowed to dwell for 10-15 minutes. This uncovered stenosis in the axillary and subclavian vein just proximal to the previously  placed stents as well as narrowing within the graft close to the arterial access site but at the location of a previous suture from a thrombectomy procedure  performed at an outside institution.  A residual arterial plug was also seen at the arterial anastomosis. An attempt to clear the arterial plug was done with 2 passes of the Fogarty embolectomy balloon. Flow-limiting arterial plug remained as was the narrowing near the arterial access site, and I elected to treat this lesion with a 6 mm diameter by 8 cm length Lutonix drug-coated angioplasty balloon.  This was inflated to 10 atm for 1 minute.  This resulted in resolution of the arterial plug, and clearance of the arterial side of the graft. The arterial outflow was seen to be intact as well on these images. The retrograde sheath was removed. I then turned my attention to the narrowing within the graft, at the venous anastomosis, and in the axillary subclavian vein proximal to the venous anastomosis.  A 7 mm diameter by 20 cm length high-pressure angioplasty balloon was used to treat basically the entire graft from the antegrade access site which was only about 2 cm from the anastomosis across the venous anastomosis and previously placed stent.  This was inflated to 20 atm for 1 minute.  There was about a 20% residual stenosis within the graft and a 10% residual stenosis at the venous anastomosis.  I then upsized to a 10 mm diameter by 4 cm length angioplasty balloon and use this to treat the axillary and subclavian veins proximal to the previously placed stent.  2 inflations were required and each were 10 atm for a minute.  Completion imaging showed about a 30 to 40% residual stenosis that was not flow-limiting and there was brisk flow through the graft.   Based on the completion imaging, no further intervention is necessary.  The wire and balloon were removed from the sheath.  A 4-0 Monocryl purse-string suture was sewn around the sheath.  The sheath was removed while tying down the suture.  A sterile bandage was applied to the puncture site.  COMPLICATIONS: None  CONDITION: Stable   Jason  Dew 05/12/2018 4:05 PM   This note was created with Dragon Medical transcription system. Any errors in dictation are purely unintentional. 

## 2018-05-12 NOTE — Progress Notes (Signed)
PHARMACY NOTE:  ANTIMICROBIAL WEIGHT-BASED DOSAGE ADJUSTMENT  Current antimicrobial regimen includes a mismatch between pre-op antimicrobial dosage and patient's weight. Patients weighing >80kg should routinely receive cefazolin 2g prior to procedure. As per policy approved by the Pharmacy & Therapeutics and Medical Executive Committees, the antimicrobial dosage will be adjusted accordingly.  Current antimicrobial dosage: cefazolin 1 gram     Antimicrobial dosage has been changed to: cefazolin 2 grams   Thank you for allowing pharmacy to be a part of this patient's care.  Dallie Piles, [facility] 05/12/2018 2:34 PM

## 2018-05-12 NOTE — Progress Notes (Signed)
[place] [facility] [place]: Barbara [name]    MR#:  [id number]  DATE OF BIRTH:  05/28/1941  SUBJECTIVE:  CHIEF COMPLAINT:   Chief Complaint  Patient presents with  . Clotted Dialysis Access  s/p temporary dialysis catheter catheter y'day. No complaints REVIEW OF SYSTEMS:  Review of Systems  Constitutional: Negative for chills, fever and weight loss.  HENT: Negative for nosebleeds and sore throat.   Eyes: Negative for blurred vision.  Respiratory: Negative for cough, shortness of breath and wheezing.   Cardiovascular: Negative for chest pain, orthopnea, leg swelling and PND.  Gastrointestinal: Negative for abdominal pain, constipation, diarrhea, heartburn, nausea and vomiting.  Genitourinary: Negative for dysuria and urgency.  Musculoskeletal: Negative for back pain.  Skin: Negative for rash.  Neurological: Negative for dizziness, speech change, focal weakness and headaches.  Endo/Heme/Allergies: Does not bruise/bleed easily.  Psychiatric/Behavioral: Negative for depression.   DRUG ALLERGIES:   Allergies  Allergen Reactions  . Glipizide Er Other (See Comments)    Hypoglycemia   . Percocet [Oxycodone-Acetaminophen] Hives   VITALS:  Blood pressure (!) 143/75, pulse 81, temperature 98.4 F (36.9 C), temperature source Oral, resp. rate 15, height 5' 8"  (1.727 m), weight 86.4 kg, SpO2 96 %. PHYSICAL EXAMINATION:  Physical Exam  Constitutional: She is oriented to person, place, and time.  HENT:  Head: Normocephalic and atraumatic.  Eyes: Pupils are equal, round, and reactive to light. Conjunctivae and EOM are normal.  Neck: Normal range of motion. Neck supple. No tracheal deviation present. No thyromegaly present.  Cardiovascular: Normal rate, regular rhythm and normal heart sounds.  Pulmonary/Chest: Effort normal and breath sounds normal. No respiratory distress. She has no wheezes. She exhibits no tenderness.  Abdominal: Soft. Bowel  sounds are normal. She exhibits no distension. There is no tenderness.  Musculoskeletal: Normal range of motion.  Neurological: She is alert and oriented to person, place, and time. No cranial nerve deficit.  Skin: Skin is warm and dry. No rash noted.  temporary dialysis catheter in right femoral area in place LABORATORY PANEL:  Female CBC Recent Labs  Lab 05/12/18 0706  WBC 3.8*  HGB 11.4*  HCT 36.3  PLT 140*   ------------------------------------------------------------------------------------------------------------------ Chemistries  Recent Labs  Lab 05/12/18 0706  NA 139  K 3.3*  CL 99  CO2 28  GLUCOSE 93  BUN 41*  CREATININE 8.34*  CALCIUM 8.0*   RADIOLOGY:  No results found. ASSESSMENT AND PLAN:  77 y.o. female with a known history of hypertension, diabetes mellitus, ESRD has been having issues with her AV fistula. The patient was sent here because they were unable to cannulate the left arm AV graft this morning  * ESRD with AVF dysfunction - s/p temporary femoral dialysis catheter in place. thrombectomy per vascular  * Hypokalemia - replete and recheck  * Metabolic acidosis resolved  *Hypertension.  Continue home medication  *Diabetes mellitus.  Sliding scale insulin for now  * Secondary Hyperparathyroidism:  - calcium acetate with meals per Nephro     All the records are reviewed and case discussed with Care Management/Social Worker. Management plans discussed with the patient, nursing and they are in agreement.  CODE STATUS: Full Code  TOTAL TIME TAKING CARE OF THIS PATIENT: 20 minutes.   More than 50% of the time was spent in counseling/coordination of care: YES  POSSIBLE D/C IN 1-2 DAYS, DEPENDING ON CLINICAL CONDITION and vascular eval   Max Sane M.D on 05/12/2018 at 3:36  PM  Between 7am to 6pm - Pager - [phone]  After 6pm go to [email or web address] - Proofreader  [facility]  Office   [phone]  CC: Primary care physician; [name], MD  Note: This dictation was prepared with Dragon dictation along with smaller phrase technology. Any transcriptional errors that result from this process are unintentional.

## 2018-05-12 NOTE — H&P (Signed)
[facility] History & Physical Update  The patient was interviewed and re-examined.  The patient's previous History and Physical has been reviewed and is unchanged.  There is no change in the plan of care. We plan to proceed with the scheduled procedure.  Leotis Pain, MD  05/12/2018, 2:56 PM

## 2018-05-13 ENCOUNTER — Encounter: Payer: Self-pay | Admitting: Vascular Surgery

## 2018-05-13 DIAGNOSIS — T82898A Other specified complication of vascular prosthetic devices, implants and grafts, initial encounter: Secondary | ICD-10-CM

## 2018-05-13 DIAGNOSIS — Z992 Dependence on renal dialysis: Secondary | ICD-10-CM

## 2018-05-13 DIAGNOSIS — R05 Cough: Secondary | ICD-10-CM

## 2018-05-13 DIAGNOSIS — N186 End stage renal disease: Secondary | ICD-10-CM

## 2018-05-13 LAB — GLUCOSE, CAPILLARY
Glucose-Capillary: 100 mg/dL — ABNORMAL HIGH (ref 70–99)
Glucose-Capillary: 77 mg/dL (ref 70–99)

## 2018-05-13 LAB — PHOSPHORUS: PHOSPHORUS: 4.7 mg/dL — AB (ref 2.5–4.6)

## 2018-05-13 MED ORDER — HEPARIN SOD (PORK) LOCK FLUSH 10 UNIT/ML IV SOLN
10.0000 [IU] | Freq: Once | INTRAVENOUS | Status: AC
  Administered 2018-05-13: 10 [IU]
  Filled 2018-05-13 (×2): qty 1

## 2018-05-13 MED ORDER — POTASSIUM CHLORIDE CRYS ER 10 MEQ PO TBCR: 40.0000 meq | EXTENDED_RELEASE_TABLET | Freq: Once | ORAL | Status: DC

## 2018-05-13 NOTE — Discharge Instructions (Signed)
Vascular Access for Hemodialysis A vascular access is a connection between two blood vessels that allows blood to be easily removed from the body and returned to the body during hemodialysis. Hemodialysis is a procedure in which a machine outside of the body filters the blood. There are three types of vascular accesses:  Arteriovenous fistula. This is a connection between an artery and a vein (usually in the arm) that is made by sewing them together. Blood in the artery flows directly into the vein, causing it to get larger over time. This makes it easier for the vein to be used for hemodialysis. An arteriovenous fistula takes 1-6 months to develop after surgery.  Arteriovenous graft. This is a connection between an artery and a vein in the arm that is made with a tube. An arteriovenous graft can be used within 2-3 weeks of surgery.  Venous catheter. This is a thin, flexible tube that is placed in a large vein (usually in the neck, chest, or groin). A venous catheter for hemodialysis contains two tubes that come out of the skin. A venous catheter can be used right away. It is usually used as a temporary access if you need hemodialysis before a fistula or graft has developed. It may also be used as a permanent access if a fistula or graft cannot be created.  Which type of access is best for me? The type of access that is best for you depends on the size and strength of your veins. A fistula is usually the preferred type of access. It can last several years and is less likely than the other types of accesses to become infected or to cause blood clots within a blood vessel (thrombosis). However, a fistula is not an option for everyone. If your veins are not the right size, a graft may be used instead. Grafts require you to have strong veins. If your veins are not strong enough for a graft, a catheter may be used. Catheters are more likely than fistulas and grafts to become infected or to have  thrombosis. Sometimes, only one type of access is an option. Your health care provider will help you determine which type of access is best for you. How is a vascular access used? The way the access is used depends on the type of access:  If the access is a fistula or graft, two needles are inserted through the skin into the access before each hemodialysis session. Blood leaves the body through one of the needles and travels through a tube to the hemodialysis machine (dialyzer). It then flows through another tube and returns to the body through the second needle.  If the access is a catheter, one tube is connected directly to the tube that leads to the dialyzer and the other is connected to a tube that leads away from the dialyzer. Blood leaves the body through one tube and returns to the body through the other.  What kind of problems can occur with vascular accesses?  Blood clots within a blood vessel (thrombosis). Thrombosis can lead to a narrowing of a blood vessel or tube (stenosis). If thrombosis occurs frequently, another access site may be created as a backup.  Infection. These problems are most likely to occur with a venous catheter and least likely to occur with an arteriovenous fistula. How do I care for my vascular access? Wear a medical alert bracelet. This tells health care providers that you are a dialysis patient in the case of an emergency and   allows them to care for your veins appropriately. If you have a graft or fistula:  A "bruit" is a noise that is heard with a stethoscope and a "thrill" is a vibration felt over the graft or fistula. The presence of the bruit and thrill indicates that the access is working. You will be taught to feel for the thrill each day. If this is not felt, the access may be clotted. Call your health care provider.  You may use the arm where your vascular access is located freely after the site heals. Keep the following in mind: ? Avoid pressure on the  arm. ? Avoid lifting heavy objects with the arm. ? Avoid sleeping on the arm. ? Avoid wearing tight-sleeved shirts or jewelry around the graft or fistula.  Do not allow blood pressure monitoring or needle punctures on the side where the graft or fistula is located.  With permission from your health care provider, you may do exercises to help with blood flow through a fistula. These exercises involve squeezing a rubber ball or other soft objects as instructed.  Contact a health care provider if:  Chills develop.  You have an oral temperature above 102 F (38.9 C).  Swelling around the graft or fistula gets worse.  New pain develops.  Pus or other fluid (drainage) is seen at the vascular access site.  Skin redness or red streaking is seen on the skin around, above, or below the vascular access. Get help right away if:  Pain, numbness, or an unusual pale skin color develops in the hand on the side of your fistula.  Dizziness or weakness develops that you have not had before.  The vascular access has bleeding that cannot be easily controlled. This information is not intended to replace advice given to you by your health care provider. Make sure you discuss any questions you have with your health care provider. Document Released: 09/26/2002 Document Revised: 12/12/2015 Document Reviewed: 11/22/2012 Elsevier Interactive Patient Education  2017 [employer]  

## 2018-05-13 NOTE — Care Management (Signed)
RNCM to assess patient for home health.  Patient off the floor for HD. Will complete assessment when patient returns

## 2018-05-13 NOTE — Progress Notes (Signed)
This note also relates to the following rows which could not be included: Pulse Rate - Cannot attach notes to unvalidated device data Resp - Cannot attach notes to unvalidated device data  Hd started

## 2018-05-13 NOTE — Progress Notes (Signed)
[facility]  Daily Progress Note   Subjective: 1 Day Post-Op: Left brachial artery to axillary vein arteriovenous graft cannulation under ultrasound guidance in both a retrograde and then antegrade fashion crossing, Left arm shuntogram and central venogram, Catheter directed thrombolysis with 4 mg of TPA delivered with the AngioJet AVX catheter, Fogarty embolectomy for residual arterial plug, Percutaneous transluminal angioplasty of the graft and venous anastomosis with 7 mm 20 cm length, high-pressure angioplasty balloon, Percutaneous transluminal angioplasty of arterial anastomosis with 6 mm diameter by 8 cm length Lutonix drug-coated angioplasty balloon, Percutaneous transluminal angioplasty of the axillary and subclavian vein with 10 mm diameter by 4 cm length angioplasty balloon  Patient without complaint this AM. Denies any left upper extremity pain or numbness. No issues overnight.   Objective: Vitals:   05/13/18 0604 05/13/18 0608 05/13/18 0804 05/13/18 0817  BP:  (!) 114/54    Pulse:  64    Resp:  15    Temp:  97.9 F (36.6 C)    TempSrc:  Oral    SpO2:  92% (!) 88% 92%  Weight: 90 kg     Height:        Intake/Output Summary (Last 24 hours) at 05/13/2018 0959 Last data filed at 05/12/2018 1700 Gross per 24 hour  Intake 0 ml  Output -  Net 0 ml   Physical Exam: A&Ox3, NAD CV: RRR Pulmonary: CTA Bilaterally Abdomen: Soft, Nontender, Nondistended Vascular:  Left Upper Extremity: Upper arm soft, forearm soft. Minimal edema. Skin intact. Good bruit  and thrill noted. 2+ radial pulse to left hand. Left hand warm.    Laboratory: CBC    Component Value Date/Time   WBC 3.8 (L) 05/12/2018 0706   HGB 11.4 (L) 05/12/2018 0706   HGB 10.2 (L) 08/07/2012 0433   HCT 36.3 05/12/2018 0706   HCT 30.7 (L) 08/07/2012 0433   PLT 140 (L) 05/12/2018 0706   PLT 294 08/07/2012 0433   BMET    Component Value Date/Time   NA 139 05/12/2018 0706   NA 138  11/04/2016 1453   NA 138 08/07/2012 0433   K 3.3 (L) 05/12/2018 0706   K 3.2 (L) 10/30/2014 1410   CL 99 05/12/2018 0706   CL 104 08/07/2012 0433   CO2 28 05/12/2018 0706   CO2 25 08/07/2012 0433   GLUCOSE 93 05/12/2018 0706   GLUCOSE 172 (H) 08/07/2012 0433   BUN 41 (H) 05/12/2018 0706   BUN 69 (H) 11/04/2016 1453   BUN 63 (H) 08/07/2012 0433   CREATININE 8.34 (H) 05/12/2018 0706   CREATININE 2.48 (H) 08/07/2012 0433   CALCIUM 8.0 (L) 05/12/2018 0706   CALCIUM 9.1 08/07/2012 0433   GFRNONAA 4 (L) 05/12/2018 0706   GFRNONAA 19 (L) 08/07/2012 0433   GFRAA 5 (L) 05/12/2018 0706   GFRAA 22 (L) 08/07/2012 0433   Assessment/Planning: 77 year old female with ESRD with non-function left upper extremity dialysis access s/p left upper extremity dialysis access declot - now functioning.  1) Had declot procedure of her left upper extremity dialysis access 2) good bruit and thrill on exam 3) OK to cannulate for dialysis  Discussed with Dr. Ellis Parents [facility] PA-C 05/13/2018 9:59 AM

## 2018-05-13 NOTE — Care Management Note (Signed)
Case Management Note  Patient Details  Name: Barbara [name] MRN: [id number] Date of Birth: 26-Nov-1940   Patient to discharge today.  Patient lives at home with spouse.  States that her spouse provides transportation to appointments and HD.   [name] dialysis liaison notified of discharge.  PCP Edwina Barth.  PT has assessed patient and recommeends home health PT.  Patient selected [name].  Referral made to [facility] with [facility].  Patient states that she has RW, cane, WC and chronic O2 through [employer] at home.  Patient is dissatisfied with the size of her portable O2 tank.  [name] from [place] notified, and [name] to reach out to patient to discuss smaller options.  Bedside RN has spoken to husband who will bring portable O2 tank at discharge.  RNCM signing off   Subjective/Objective:                    Action/Plan:   Expected Discharge Date:  05/13/18               Expected Discharge Plan:  [place]  In-House Referral:     Discharge planning Services  CM Consult  Post Acute Care Choice:  Home Health Choice offered to:  Patient  DME Arranged:    DME Agency:     HH Arranged:  RN, PT [name]:  [name]  Status of Service:  Completed, signed off  If discussed at [place] of Stay Meetings, dates discussed:    Additional Comments:  [name], RN 05/13/2018, 3:30 PM

## 2018-05-13 NOTE — Progress Notes (Signed)
Post HD Treatment    05/13/18 1355  Neurological  Level of Consciousness Alert  Orientation Level Oriented X4  Respiratory  Respiratory Pattern Regular;Unlabored;Symmetrical  Chest Assessment Chest expansion symmetrical  Bilateral Breath Sounds Diminished  Cardiac  Pulse Regular  Heart Sounds S1, S2  Jugular Venous Distention (JVD) No  ECG Monitor Yes  Cardiac Rhythm NSR  Heart Block Type 1st degree AVB  Antiarrhythmic device No  Vascular  R Radial Pulse +2  L Radial Pulse +2  R Dorsalis Pedis Pulse +1  L Dorsalis Pedis Pulse +1  Edema Left upper extremity;Right lower extremity  RUE Edema Non-pitting  Integumentary  Integumentary (WDL) X  Skin Color Appropriate for ethnicity  Skin Condition Dry  Musculoskeletal  Musculoskeletal (WDL) X  Generalized Weakness Yes  GU Assessment  Genitourinary (WDL) X (HD pt)  Psychosocial  Psychosocial (WDL) WDL

## 2018-05-13 NOTE — Progress Notes (Signed)
HD Treatment Complete    05/13/18 1347  Vital Signs  Pulse Rate 66  Pulse Rate Source Monitor  Resp 14  BP 130/70  BP Location Right Arm  BP Method Automatic  Patient Position (if appropriate) Lying  Oxygen Therapy  SpO2 92 %  O2 Device Nasal Cannula  O2 Flow Rate (L/min) 2 L/min  During Hemodialysis Assessment  Blood Flow Rate (mL/min) 325 mL/min  Arterial Pressure (mmHg) -150 mmHg  Venous Pressure (mmHg) 210 mmHg  Transmembrane Pressure (mmHg) 50 mmHg  Ultrafiltration Rate (mL/min) 550 mL/min  Dialysate Flow Rate (mL/min) 800 ml/min  Conductivity: Machine  14.1  HD Safety Checks Performed Yes  Intra-Hemodialysis Comments Progressing as prescribed;Tolerated well;Tx completed (UF 2011)

## 2018-05-13 NOTE — Plan of Care (Signed)
Assumed care of dialysis pt.

## 2018-05-13 NOTE — Progress Notes (Signed)
The patient's oxygen level was at 88% this morning on room air.

## 2018-05-13 NOTE — Progress Notes (Signed)
[facility]  ROUNDING NOTE   Subjective:  Patient completed hemodialysis today. Net ultrafiltration was 1.5 kg.   Objective:  Vital signs in last 24 hours:  Temp:  [97.5 F (36.4 C)-98.5 F (36.9 C)] 98.5 F (36.9 C) (10/25 1511) Pulse Rate:  [62-70] 67 (10/25 1511) Resp:  [13-21] 16 (10/25 1511) BP: (99-146)/(54-70) 133/58 (10/25 1511) SpO2:  [88 %-97 %] 90 % (10/25 1511) Weight:  [89.2 kg-90 kg] 89.2 kg (10/25 1400)  Weight change: -1.1 kg Filed Weights   05/12/18 1442 05/13/18 0604 05/13/18 1400  Weight: 86.4 kg 90 kg 89.2 kg    Intake/Output: I/O last 3 completed shifts: In: 0  Out: 1502 [Other:1502]   Intake/Output this shift:  Total I/O In: -  Out: 4098 [Other:1511]  Physical Exam: General: NAD  Head: Normocephalic, atraumatic. Moist oral mucosal membranes  Eyes: Anicteric  Neck: Supple, trachea midline  Lungs:  Clear to auscultation  Heart: Regular rate and rhythm  Abdomen:  Soft, nontender  Extremities: Trace  peripheral edema.  Neurologic: Nonfocal, moving all four extremities  Skin: No lesions  Access: Left AVF    Basic Metabolic Panel: Recent Labs  Lab 05/10/18 1122 05/11/18 0444 05/12/18 0706 05/13/18 1019  NA 142 142 139  --   K 4.5 4.7 3.3*  --   CL 111 109 99  --   CO2 11* 17* 28  --   GLUCOSE 90 95 93  --   BUN 84* 90* 41*  --   CREATININE 13.71* 14.58* 8.34*  --   CALCIUM 7.6* 7.8* 8.0*  --   PHOS  --   --   --  4.7*    Liver Function Tests: No results for input(s): AST, ALT, ALKPHOS, BILITOT, PROT, ALBUMIN in the last 168 hours. No results for input(s): LIPASE, AMYLASE in the last 168 hours. No results for input(s): AMMONIA in the last 168 hours.  CBC: Recent Labs  Lab 05/10/18 1817 05/10/18 2114 05/11/18 0444 05/12/18 0706  WBC 2.3* 5.0 5.0 3.8*  HGB 4.7* 11.7* 11.5* 11.4*  HCT 16.2* 37.7 37.5 36.3  MCV 105.2* 98.7 100.5* 96.8  PLT 72* 177 181 140*    Cardiac Enzymes: No results for input(s):  CKTOTAL, CKMB, CKMBINDEX, TROPONINI in the last 168 hours.  BNP: Invalid input(s): POCBNP  CBG: Recent Labs  Lab 05/12/18 1115 05/12/18 1655 05/12/18 2100 05/13/18 0752 05/13/18 1509  GLUCAP 104* 101* 108* 100* 59    Microbiology: Results for orders placed or performed during the hospital encounter of 05/10/18  MRSA PCR Screening     Status: Abnormal   Collection Time: 05/11/18  5:02 AM  Result Value Ref Range Status   MRSA by PCR POSITIVE (A) NEGATIVE Final    Comment:        The GeneXpert MRSA Assay (FDA approved for NASAL specimens only), is one component of a comprehensive MRSA colonization surveillance program. It is not intended to diagnose MRSA infection nor to guide or monitor treatment for MRSA infections. RESULT CALLED TO, READ BACK BY AND VERIFIED WITH: LAUREN [email or web address]  ON 05/11/18 BY HKP Performed at [facility], [address] [place], [place] [address]     Coagulation Studies: No results for input(s): LABPROT, INR in the last 72 hours.  Urinalysis: No results for input(s): COLORURINE, LABSPEC, PHURINE, GLUCOSEU, HGBUR, BILIRUBINUR, KETONESUR, PROTEINUR, UROBILINOGEN, NITRITE, LEUKOCYTESUR in the last 72 hours.  Invalid input(s): APPERANCEUR    Imaging: No results found.   Medications:   . sodium  chloride 1,000 mL (05/12/18 1458)   . atorvastatin  40 mg Oral Daily  . calcium acetate  1,334 mg Oral TID WC  . Chlorhexidine Gluconate Cloth  6 each Topical Q0600  . cinacalcet  30 mg Oral Q breakfast  . ferrous sulfate  325 mg Oral QPC breakfast  . furosemide  40 mg Oral BID  . gabapentin  100 mg Oral QHS  . heparin flush  10 Units Intracatheter Once  . heparin  5,000 Units Subcutaneous Q8H  . insulin aspart  0-9 Units Subcutaneous TID WC  . latanoprost  1 drop Both Eyes QHS  . mupirocin ointment  1 application Nasal BID  . oxybutynin  5 mg Oral TID  . pantoprazole  40 mg Oral Daily   acetaminophen **OR** acetaminophen,  albuterol, ondansetron **OR** ondansetron (ZOFRAN) IV, polyethylene glycol, traMADol  Assessment/ Plan:  Barbara [name] is a 77 y.o. black female with end stage renal disease on hemodialysis, hypertension, obesity, obstructive sleep apnea, GERD, hyperlipidemia, diabetes mellitus type II, coronary artery disease, COPD, celiac disease  [facility] MWF [place] Left AVF  1. End stage renal disease with complication of dialysis access Nonfunctioning AVF at admission, s/p declot.  - her access was declotted.  She has completed hemodialysis.  We will plan for dialysis againon Monday as an outpatient.  2. Hypertension: blood pressure at goal.  Currently off of antihypertensives.  3. Anemia of chronic kidney disease: continue to monitor CBC as an outpatient.  Most recent hemoglobin was 11.4.  4. Secondary Hyperparathyroidism:  - serum phosphorus at target at 4.7.  Maintain the patient on calcium acetate.   LOS: 3 Sarinah Doetsch 10/25/20194:10 PM

## 2018-05-13 NOTE — Progress Notes (Signed)
Post HD Treatment  Pt tolerated treatment well. Her net UF was 1511 and the BVP was 66.3. All blood was returned to patient.     05/13/18 1400  Vital Signs  Temp (!) 97.5 F (36.4 C)  Pulse Rate 67  Pulse Rate Source Monitor  Resp 18  BP 119/65  BP Location Right Arm  BP Method Automatic  Patient Position (if appropriate) Lying  Oxygen Therapy  SpO2 94 %  O2 Device Nasal Cannula  O2 Flow Rate (L/min) 2 L/min  Pain Assessment  Pain Scale 0-10  Pain Score 0  Dialysis Weight  Weight 89.2 kg  Type of Weight Post-Dialysis  Post-Hemodialysis Assessment  Rinseback Volume (mL) 250 mL  KECN 206 V  Dialyzer Clearance Lightly streaked  Duration of HD Treatment -hour(s) 3.5 hour(s)  Hemodialysis Intake (mL) 500 mL  UF Total -Machine (mL) 2011 mL  Net UF (mL) 1511 mL  Tolerated HD Treatment Yes  Post-Hemodialysis Comments Pt tolerated treatment well  AVG/AVF Arterial Site Held (minutes) 15 minutes  AVG/AVF Venous Site Held (minutes) 10 minutes  Fistula / Graft Left Upper arm Arteriovenous vein graft  Placement Date/Time: 02/17/17 1111   Orientation: Left  Access Location: Upper arm  Access Type: Arteriovenous vein graft  Site Condition No complications  Fistula / Graft Assessment Present;Thrill;Bruit  Status Deaccessed  Drainage Description None

## 2018-05-13 NOTE — Progress Notes (Signed)
OT Cancellation Note  Patient Details Name: Barbara [name] MRN: [id number] DOB: Dec 01, 1940   Cancelled Treatment:    Reason Eval/Treat Not Completed: Patient at procedure or test/ unavailable. Upon attempt to treat, pt unavailable, receiving dialysis. Will re-attempt at later date/time as pt is available and medically appropriate.   Jeni Salles, MPH, MS, OTR/L ascom [phone] 05/13/18, 3:32 PM

## 2018-05-14 LAB — HEPATITIS B SURFACE ANTIGEN: Hepatitis B Surface Ag: NEGATIVE

## 2018-05-14 NOTE — Discharge Summary (Signed)
[place] [facility] [place] NAME: Barbara [name]    MR#:  [id number]  DATE OF BIRTH:  Apr 08, 1941  DATE OF ADMISSION:  05/10/2018   ADMITTING PHYSICIAN: Hillary Bow, MD  DATE OF DISCHARGE: 05/13/2018  7:49 PM  PRIMARY CARE PHYSICIAN: [name], MD   ADMISSION DIAGNOSIS:  Cough [R05] Arteriovenous fistula occlusion, initial encounter ([place]) [T82.898A] DISCHARGE DIAGNOSIS:  Active Problems:   Metabolic acidosis  SECONDARY DIAGNOSIS:   Past Medical History:  Diagnosis Date  . Adult celiac disease   . Anemia associated with chronic renal failure 2017   blood transfusion last week 10/17  . Arthritis   . Asthma   . 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 Chronic kidney disease   . 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  dr. Candiss Norse and dr. Holley Raring follow her  . COPD (chronic obstructive pulmonary disease) ([place])   . Coronary artery disease   . Diabetes mellitus without complication ([place])   . Dialysis patient ([place])    Tues, Thurs, Sat  . Dyspnea    with exertion  . Elevated lipids   . GERD (gastroesophageal reflux disease)   . Hematuria   . Hypertension   . Lower back pain   . Neuropathy   . Oxygen dependent    at hs  . Personal history of chemotherapy 2017   bladder ca  . Personal history of radiation therapy 2017   bladder ca  . Sleep apnea    uses cpap  . Urinary obstruction 01/2016   HOSPITAL COURSE:  77 y.o.femalewith a known history of hypertension, diabetes mellitus, ESRD has been having issues with her AV fistula. The patient was sent for admission because they were unable to cannulate theleft arm AV graft  *ESRD with AVFdysfunction - s/p temporary femoral dialysis catheter in place followed by thrombectomy per vascular  * Hypokalemia - repleted   * Metabolic acidosis resolved  *Hypertension: stable on home regimen  *Diabetes mellitus: stable  * Secondary Hyperparathyroidism:  - calcium  acetate with meals per Nephro DISCHARGE CONDITIONS:  STABLE CONSULTS OBTAINED:  Treatment Team:  Katha Cabal, MD DRUG ALLERGIES:   Allergies  Allergen Reactions  . Glipizide Er Other (See Comments)    Hypoglycemia   . Percocet [Oxycodone-Acetaminophen] Hives   DISCHARGE MEDICATIONS:   Allergies as of 05/13/2018      Reactions   Glipizide Er Other (See Comments)   Hypoglycemia   Percocet [oxycodone-acetaminophen] Hives      Medication List    TAKE these medications   ACCU-CHEK AVIVA PLUS test strip Generic drug:  glucose blood 1 each by Other route daily. use as directed   ACCU-CHEK AVIVA PLUS w/Device Kit (USE TO MONITOR BLOOD SUGARS.)   ACCU-CHEK SOFTCLIX LANCETS lancets INJECT 1 EACH AS DIRECTED ONCE DAILY.   acetaminophen 500 MG tablet Commonly known as:  TYLENOL Take 1,000 mg by mouth 2 (two) times daily as needed for moderate pain or headache.   atorvastatin 40 MG tablet Commonly known as:  LIPITOR Take 40 mg by mouth daily.   calcium acetate 667 MG capsule Commonly known as:  PHOSLO Take 2 capsules (1,334 mg total) by mouth 3 (three) times daily with meals.   cholecalciferol 1000 units tablet Commonly known as:  VITAMIN D Take 1,000 Units by mouth daily.   cinacalcet 30 MG tablet Commonly known as:  SENSIPAR Take 30 mg by mouth daily. With largest meal  esomeprazole 40 MG capsule Commonly known as:  NEXIUM Take 40 mg by mouth daily after breakfast.   ferrous sulfate 325 (65 FE) MG EC tablet Take 325 mg by mouth daily after breakfast.   furosemide 40 MG tablet Commonly known as:  LASIX Take 40 mg by mouth 2 (two) times daily. Take morning dose every day. Lunch dose as needed.   gabapentin 300 MG capsule Commonly known as:  NEURONTIN Take 600 mg by mouth 3 (three) times daily.   lidocaine 5 % ointment Commonly known as:  XYLOCAINE Apply 1 application topically 2 (two) times daily as needed for mild pain.   lidocaine-prilocaine  cream Commonly known as:  EMLA APPLY TO AFFECTED AREA ONCE   MYRBETRIQ 25 MG Tb24 tablet Generic drug:  mirabegron ER Take 25 mg by mouth daily.   NON FORMULARY Place 1 Units into the nose at bedtime. CPAP Time of use 2100-0600   oxybutynin 5 MG tablet Commonly known as:  DITROPAN Take 5 mg by mouth 3 (three) times daily.   OXYGEN Inhale 2 L into the lungs at bedtime.   PROAIR RESPICLICK 768 (90 Base) MCG/ACT Aepb Generic drug:  Albuterol Sulfate Inhale 2 puffs into the lungs 4 (four) times daily as needed (wheezing).   traMADol 50 MG tablet Commonly known as:  ULTRAM Take 50 mg by mouth every 12 (twelve) hours as needed.   Travoprost (BAK Free) 0.004 % Soln ophthalmic solution Commonly known as:  TRAVATAN Place 1 drop into both eyes at bedtime.      DISCHARGE INSTRUCTIONS:   DIET:  Cardiac diet DISCHARGE CONDITION:  Good ACTIVITY:  Activity as tolerated OXYGEN:  Home Oxygen: No.  Oxygen Delivery: room air DISCHARGE LOCATION:  Home with HHPT  If you experience worsening of your admission symptoms, develop shortness of breath, life threatening emergency, suicidal or homicidal thoughts you must seek medical attention immediately by calling 911 or calling your MD immediately  if symptoms less severe.  You Must read complete instructions/literature along with all the possible adverse reactions/side effects for all the Medicines you take and that have been prescribed to you. Take any new Medicines after you have completely understood and accpet all the possible adverse reactions/side effects.   Please note  You were cared for by a hospitalist during your hospital stay. If you have any questions about your discharge medications or the care you received while you were in the hospital after you are discharged, you can call the unit and asked to speak with the hospitalist on call if the hospitalist that took care of you is not available. Once you are discharged, your  primary care physician will handle any further medical issues. Please note that NO REFILLS for any discharge medications will be authorized once you are discharged, as it is imperative that you return to your primary care physician (or establish a relationship with a primary care physician if you do not have one) for your aftercare needs so that they can reassess your need for medications and monitor your lab values.    On the day of Discharge:  VITAL SIGNS:  Blood pressure (!) 133/58, pulse 67, temperature 98.5 F (36.9 C), temperature source Oral, resp. rate 16, height 5' 8"  (1.727 m), weight 89.2 kg, SpO2 90 %. PHYSICAL EXAMINATION:  GENERAL:  77 y.o.-year-old patient lying in the bed with no acute distress.  EYES: Pupils equal, round, reactive to light and accommodation. No scleral icterus. Extraocular muscles intact.  HEENT: Head atraumatic, normocephalic.  Oropharynx and nasopharynx clear.  NECK:  Supple, no jugular venous distention. No thyroid enlargement, no tenderness.  LUNGS: Normal breath sounds bilaterally, no wheezing, rales,rhonchi or crepitation. No use of accessory muscles of respiration.  CARDIOVASCULAR: S1, S2 normal. No murmurs, rubs, or gallops.  ABDOMEN: Soft, non-tender, non-distended. Bowel sounds present. No organomegaly or mass.  EXTREMITIES: No pedal edema, cyanosis, or clubbing.  NEUROLOGIC: Cranial nerves II through XII are intact. Muscle strength 5/5 in all extremities. Sensation intact. Gait not checked.  PSYCHIATRIC: The patient is alert and oriented x 3.  SKIN: No obvious rash, lesion, or ulcer.  DATA REVIEW:   CBC Recent Labs  Lab 05/12/18 0706  WBC 3.8*  HGB 11.4*  HCT 36.3  PLT 140*    Chemistries  Recent Labs  Lab 05/12/18 0706  NA 139  K 3.3*  CL 99  CO2 28  GLUCOSE 93  BUN 41*  CREATININE 8.34*  CALCIUM 8.0*     Follow-up Information    Baxter Hire, MD. Go on 05/18/2018.   Specialty:  Internal Medicine Why:  Wednesday  October 30th at 4:15 for a follow-up appointment  Contact information: [place] [address] [phone]        Algernon Huxley, MD On 06/21/2018.   Specialties:  Vascular Surgery, Radiology, Interventional Cardiology Why:  Tuesday December 3rd at 1pm for a follow-up appointment   Can see Dew or APP. Declot as inpatient. Will need HDA with appointment.  Contact information: [place] [address] [phone]           Management plans discussed with the patient, family and they are in agreement.  CODE STATUS: Prior   TOTAL TIME TAKING CARE OF THIS PATIENT: 45 minutes.    Max Sane M.D on 05/14/2018 at 2:19 PM  Between 7am to 6pm - Pager - [phone]  After 6pm go to [email or web address] - Proofreader  [facility]  Office  [phone]  CC: Primary care physician; [name], MD   Note: This dictation was prepared with Dragon dictation along with smaller phrase technology. Any transcriptional errors that result from this process are unintentional.

## 2018-05-17 ENCOUNTER — Telehealth: Payer: Self-pay

## 2018-05-17 NOTE — Telephone Encounter (Signed)
Patient returned call.  She does not have any questions about discharge papers at this time.  Reports home health has been out and mentioned the nurse that saw her planned to have a SW contact patient as well.  No other questions or concerns currently.  I thanked her for her time and informed her she would receive one more automated call checking in during the next few days.

## 2018-05-17 NOTE — Telephone Encounter (Signed)
Flagged on EMMI report for having questions about discharge papers.  First attempt to reach patient made 05/17/18 at 2:21pm, however unable to reach patient.  Left voicemail encouraging callback. Will attempt at later time.

## 2018-06-07 ENCOUNTER — Telehealth: Payer: Self-pay | Admitting: Urology

## 2018-06-07 NOTE — Telephone Encounter (Signed)
Patient called and stated that she was having gross hematuria and burning for the last 3 weeks had her urine checked and was given ABX finished them today but is still having the symptoms. Her cysto is not until 06-28-18.  Please advise   [name]

## 2018-06-07 NOTE — Telephone Encounter (Signed)
Who treated her and what was she given?  Please have her come in for a nurse visit cath UA/urine culture.  Hollice Espy, MD

## 2018-06-08 ENCOUNTER — Other Ambulatory Visit: Payer: Self-pay | Admitting: Internal Medicine

## 2018-06-08 DIAGNOSIS — C670 Malignant neoplasm of trigone of bladder: Secondary | ICD-10-CM

## 2018-06-08 NOTE — Telephone Encounter (Signed)
Called and made patient an app Lm for her to cb to confirm it   [name]

## 2018-06-09 ENCOUNTER — Encounter: Payer: Self-pay | Admitting: Podiatry

## 2018-06-09 ENCOUNTER — Ambulatory Visit (INDEPENDENT_AMBULATORY_CARE_PROVIDER_SITE_OTHER): Payer: Medicare Other | Admitting: Podiatry

## 2018-06-09 ENCOUNTER — Ambulatory Visit: Payer: Medicare Other

## 2018-06-09 ENCOUNTER — Encounter: Payer: Self-pay | Admitting: Urology

## 2018-06-09 DIAGNOSIS — M2012 Hallux valgus (acquired), left foot: Secondary | ICD-10-CM | POA: Diagnosis not present

## 2018-06-09 DIAGNOSIS — B351 Tinea unguium: Secondary | ICD-10-CM

## 2018-06-09 DIAGNOSIS — E1142 Type 2 diabetes mellitus with diabetic polyneuropathy: Secondary | ICD-10-CM

## 2018-06-09 DIAGNOSIS — M79674 Pain in right toe(s): Secondary | ICD-10-CM

## 2018-06-09 DIAGNOSIS — M2041 Other hammer toe(s) (acquired), right foot: Secondary | ICD-10-CM

## 2018-06-09 DIAGNOSIS — M79675 Pain in left toe(s): Secondary | ICD-10-CM | POA: Diagnosis not present

## 2018-06-09 DIAGNOSIS — M2011 Hallux valgus (acquired), right foot: Secondary | ICD-10-CM

## 2018-06-09 NOTE — Progress Notes (Signed)
Complaint:  Visit Type: Patient returns to my office for continued preventative foot care services. Complaint: Patient states" my nails have grown long and thick and become painful to walk and wear shoes" Patient has been diagnosed with DM with no foot complications. The patient presents for preventative foot care services. No changes to ROS  Podiatric Exam: Vascular: dorsalis pedis and posterior tibial pulses are palpable bilateral. Capillary return is immediate. Temperature gradient is WNL. Skin turgor WNL  Sensorium: Diminished  Semmes Weinstein monofilament test. Normal tactile sensation bilaterally. Nail Exam: Pt has thick disfigured discolored nails with subungual debris noted bilateral entire nail hallux through fifth toenails Ulcer Exam: There is no evidence of ulcer or pre-ulcerative changes or infection. Orthopedic Exam: Muscle tone and strength are WNL. No limitations in general ROM. No crepitus or effusions noted. Foot type and digits show no abnormalities. HAV  B/L.  Hammer toe 2 right. Skin: No Porokeratosis. No infection or ulcers  Diagnosis:  Onychomycosis, , Pain in right toe, pain in left toes  Treatment & Plan Procedures and Treatment: Consent by patient was obtained for treatment procedures.   Debridement of mycotic and hypertrophic toenails, 1 through 5 bilateral and clearing of subungual debris. No ulceration, no infection noted. Patient qualifies for diabetic shoes due to DPN, HAV and hammer toe. Return Visit-Office Procedure: Patient instructed to return to the office for a follow up visit 3 months for continued evaluation and treatment.    Gardiner Barefoot DPM

## 2018-06-13 ENCOUNTER — Inpatient Hospital Stay: Payer: Medicare Other | Attending: Internal Medicine

## 2018-06-21 ENCOUNTER — Ambulatory Visit (INDEPENDENT_AMBULATORY_CARE_PROVIDER_SITE_OTHER): Payer: Medicare Other | Admitting: Vascular Surgery

## 2018-06-21 ENCOUNTER — Encounter (INDEPENDENT_AMBULATORY_CARE_PROVIDER_SITE_OTHER): Payer: Medicare Other

## 2018-06-28 ENCOUNTER — Other Ambulatory Visit: Payer: Self-pay

## 2018-06-28 ENCOUNTER — Encounter: Payer: Self-pay | Admitting: Urology

## 2018-06-28 ENCOUNTER — Ambulatory Visit (INDEPENDENT_AMBULATORY_CARE_PROVIDER_SITE_OTHER): Payer: Medicare Other | Admitting: Urology

## 2018-06-28 VITALS — BP 124/55 | HR 72 | Ht 68.0 in | Wt 192.0 lb

## 2018-06-28 DIAGNOSIS — R34 Anuria and oliguria: Secondary | ICD-10-CM

## 2018-06-28 DIAGNOSIS — N3040 Irradiation cystitis without hematuria: Secondary | ICD-10-CM

## 2018-06-28 DIAGNOSIS — R310 Gross hematuria: Secondary | ICD-10-CM

## 2018-06-28 DIAGNOSIS — Z8551 Personal history of malignant neoplasm of bladder: Secondary | ICD-10-CM

## 2018-06-28 DIAGNOSIS — N1330 Unspecified hydronephrosis: Secondary | ICD-10-CM

## 2018-06-28 LAB — URINALYSIS, COMPLETE
Bilirubin, UA: NEGATIVE
NITRITE UA: POSITIVE — AB
Specific Gravity, UA: 1.015 (ref 1.005–1.030)
UUROB: 4 mg/dL — AB (ref 0.2–1.0)
pH, UA: 8.5 — ABNORMAL HIGH (ref 5.0–7.5)

## 2018-06-28 LAB — MICROSCOPIC EXAMINATION

## 2018-06-28 NOTE — Progress Notes (Signed)
06/28/18  CC:  Chief Complaint  Patient presents with  . Procedure    cystoscopy    HPI: 77 year old female with history of muscle invasive bladder cancer diagnosed in 01/2015 status post TURBT followed by palliative gemcitabine and concurrent radiation.    She also underwent recent CT PET scan on 01/01/2017 which shows marked bladder wall thickening and bilateral hydronephrosis despite ureteral stents in place. There is no evidence of metastatic disease.   She was taken to the operating room on 10/12/2016 at which time the bladder was noted to be diffusely erythematous, small in capacity with newly identified right hydroureteronephrosis down to level of the distal ureter with a negative ureteroscopy. This possibly related to radiation changes of the distal ureter. Bilateral ureteral stents replaced at that time.  She was subsequently started on hemodialysis. 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  There is no evidence of malignancy.  The left UO was scarred down and unable to be cannulated at the time of retrograde pyelogram.  She again returned to the operating room for repeat bladder biopsy on 10/07/2017.  Pathology again negative for malignancy, consisten with radiation change.  Most recent cross-sectional imaging in the form of CT chest abdomen pelvis without contrast on 01/19/2018 shows diffusely thickened bladder, significant bilateral hydroureteronephrosis and in fact decrease in size of right common iliac lymph node.  There is no evidence of metastatic disease.    She is scheduled for PET scan in January.  She reports that she has been treated recently by her primary care physician for presumed UTI.  She is passing blood which is worrisome to her.  She voids very little now that she is on hemodialysis.  She feels  like she has to strain to void and very little comes out.   Blood pressure 129/62, pulse 84, height 5' 8"  (1.727 m), weight 161 lb 14.4 oz (73.4 kg). NED. A&Ox3.   No respiratory distress   Abd soft, NT, ND Normal external genitalia with patent urethral meatus  Cystoscopy Procedure Note  Patient identification was confirmed, informed consent was obtained, and patient was prepped using Betadine solution.  Lidocaine jelly was administered per urethral meatus.    Preoperative abx where received prior to procedure.    Procedure: - Flexible cystoscope introduced, without any difficulty.   - Thorough search of the bladder revealed:    Pipe like urethral meatus Mildly trabeculated bladder Multiple diffuse patches of bladder erythema with ulceration consistent with known radiation changes, stable No active or viable tumor appreciated Small degenerating clot within bladder, irrigated out Trigone distorted  Post-Procedure: - Cystoscopy well tolerated today  Assessment/ Plan:  1.  History of bladder cancer/bladder lesions NED today- bladder c/w radiation changes Continue surveillance cystoscopy, continue q6 month in absence of recurrence Followed closely at the cancer center, due for PET scan in January - Urinalysis, Complete - ciprofloxacin (CIPRO) tablet 500 mg; Take 1 tablet (500 mg total) by mouth once. - lidocaine (XYLOCAINE) 2 % jelly 1 application; Place 1 application into the urethra once. - Microscopic Examination  2. 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 Gross hematuria Likely secondary to #3  5. Anuria and oliguria Strained to void with very little urine output likely secondary to very little urine output   Return in about 6 months (around 12/28/2018) for cysto.   Hollice Espy, MD

## 2018-06-30 ENCOUNTER — Ambulatory Visit (INDEPENDENT_AMBULATORY_CARE_PROVIDER_SITE_OTHER): Payer: Medicare Other | Admitting: Vascular Surgery

## 2018-06-30 ENCOUNTER — Ambulatory Visit (INDEPENDENT_AMBULATORY_CARE_PROVIDER_SITE_OTHER): Payer: Medicare Other

## 2018-06-30 ENCOUNTER — Encounter (INDEPENDENT_AMBULATORY_CARE_PROVIDER_SITE_OTHER): Payer: Self-pay | Admitting: Vascular Surgery

## 2018-06-30 VITALS — BP 129/56 | HR 84 | Resp 20 | Ht 68.0 in | Wt 190.0 lb

## 2018-06-30 DIAGNOSIS — I700 Atherosclerosis of aorta: Secondary | ICD-10-CM

## 2018-06-30 DIAGNOSIS — N186 End stage renal disease: Secondary | ICD-10-CM | POA: Diagnosis not present

## 2018-06-30 DIAGNOSIS — T829XXD Unspecified complication of cardiac and vascular prosthetic device, implant and graft, subsequent encounter: Secondary | ICD-10-CM

## 2018-06-30 DIAGNOSIS — J449 Chronic obstructive pulmonary disease, unspecified: Secondary | ICD-10-CM

## 2018-06-30 DIAGNOSIS — I2510 Atherosclerotic heart disease of native coronary artery without angina pectoris: Secondary | ICD-10-CM

## 2018-06-30 DIAGNOSIS — I10 Essential (primary) hypertension: Secondary | ICD-10-CM

## 2018-06-30 DIAGNOSIS — T829XXS Unspecified complication of cardiac and vascular prosthetic device, implant and graft, sequela: Secondary | ICD-10-CM

## 2018-06-30 IMAGING — CT CT RENAL STONE PROTOCOL
3 of 4 series · 10 of 46 positions shown, 17 images · non-contrast
Comparison: CT abdomen and pelvis 10/03/2014.

CLINICAL DATA: Left flank pain today.  No known injury.

EXAM:
CT ABDOMEN AND PELVIS WITHOUT CONTRAST
TECHNIQUE: Multidetector CT imaging of the abdomen and pelvis was performed
following the standard protocol without IV contrast.

[Series 4: lung · axial · 0.83mm/px · z∈[-186,-70]mm · 6 of 33 slices shown, 11 images]
[im 5/33  soft-tissue]
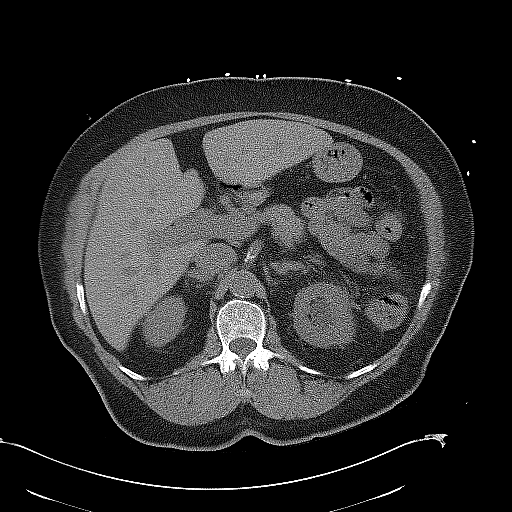
[im 5/33  bone]
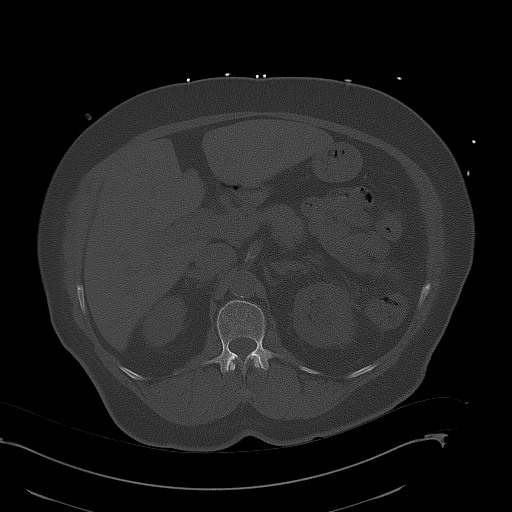
[im 10/33  soft-tissue]
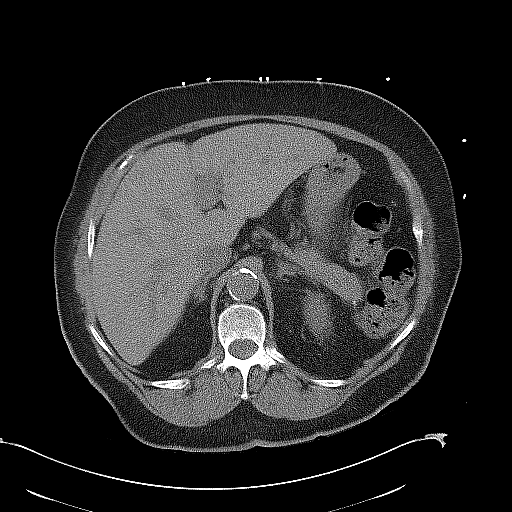
[im 14/33  soft-tissue]
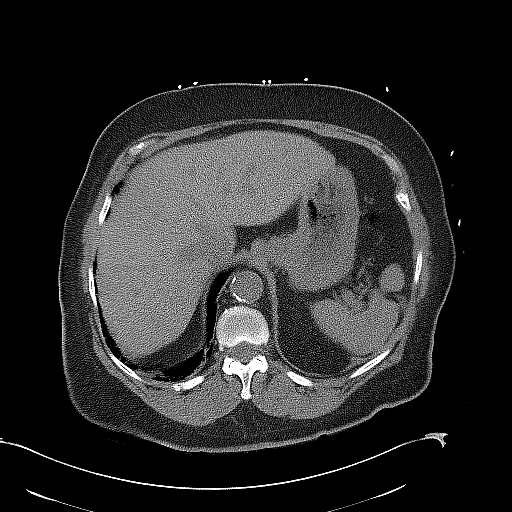
[im 14/33  lung]
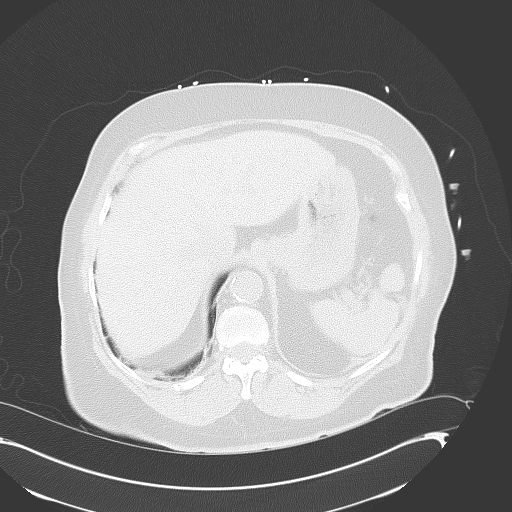
[im 19/33  soft-tissue]
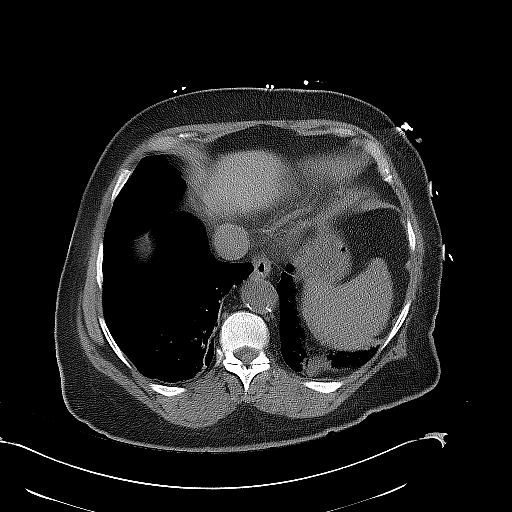
[im 19/33  lung]
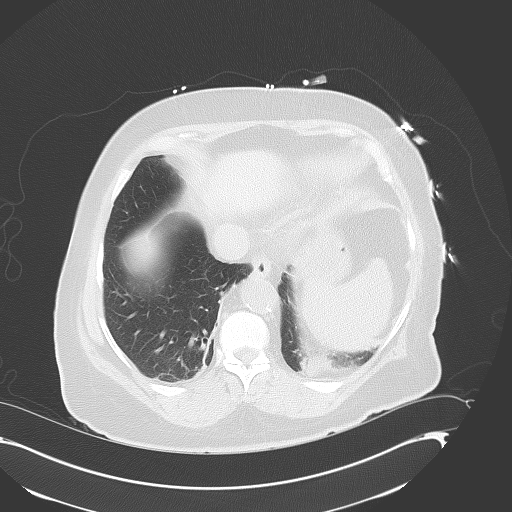
[im 23/33  soft-tissue]
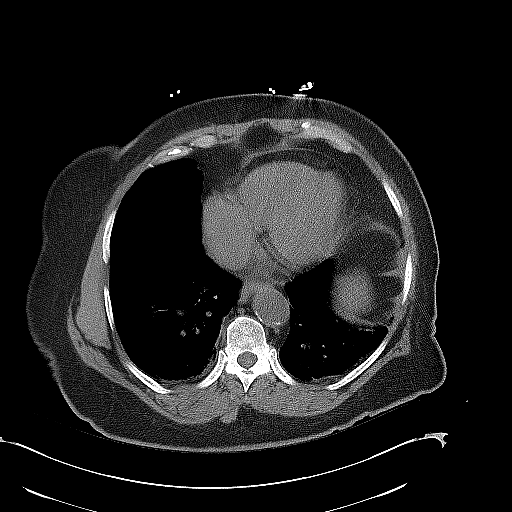
[im 23/33  lung]
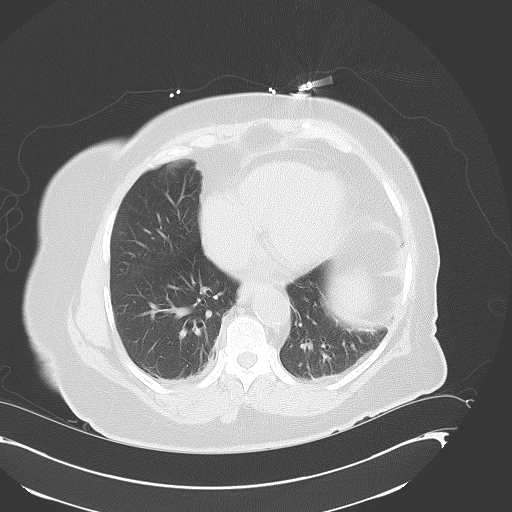
[im 28/33  soft-tissue]
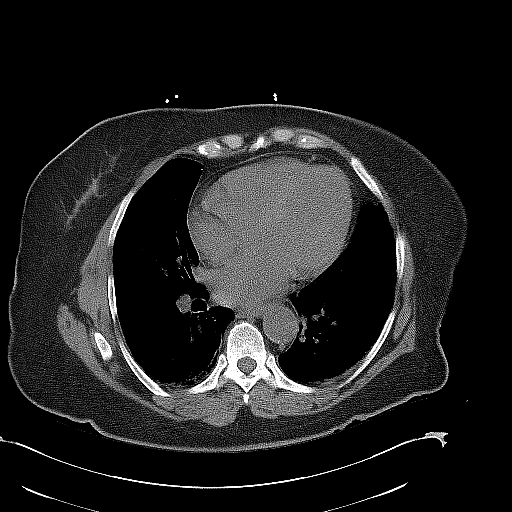
[im 28/33  lung]
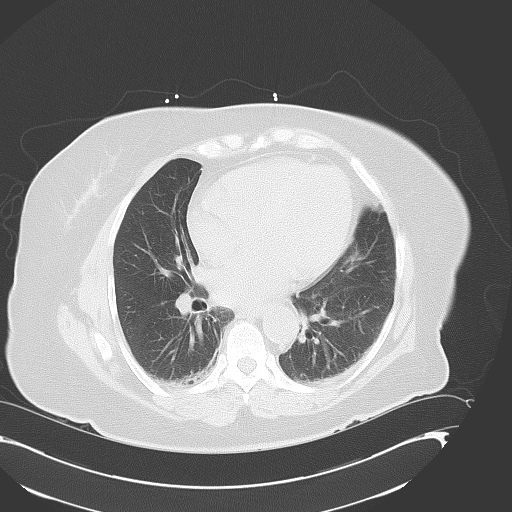

[Series 5: coronal · coronal · 0.84mm/px · 3 of 150 slices shown, 4 images]
[im 50/150  soft-tissue]
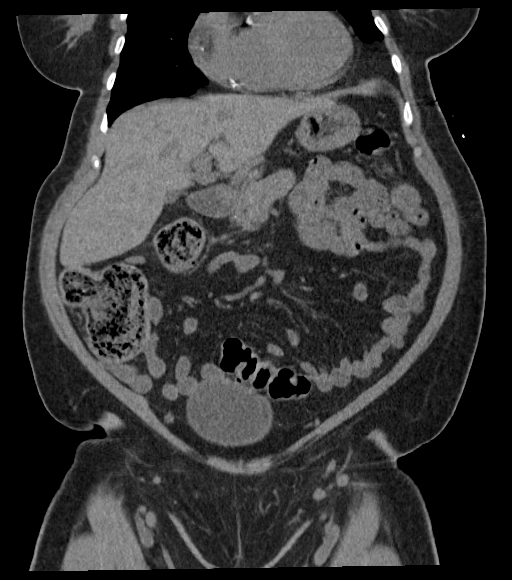
[im 67/150  soft-tissue]
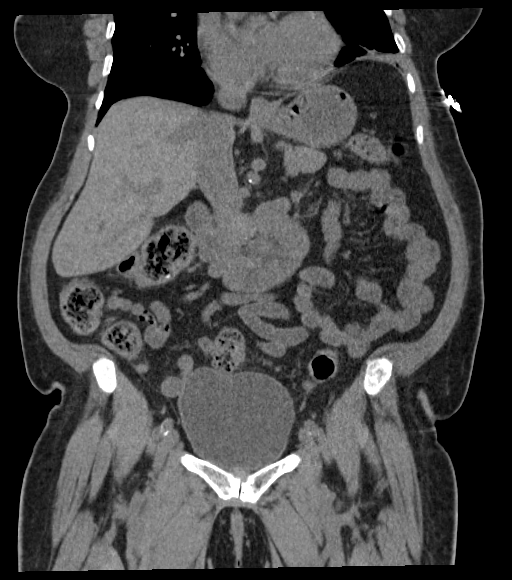
[im 67/150  bone]
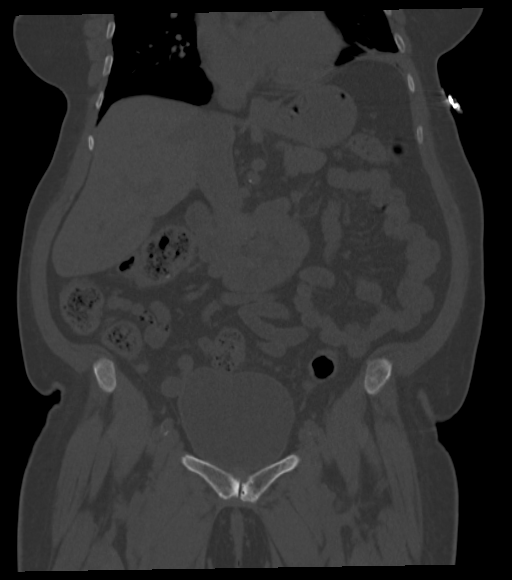
[im 83/150  soft-tissue]
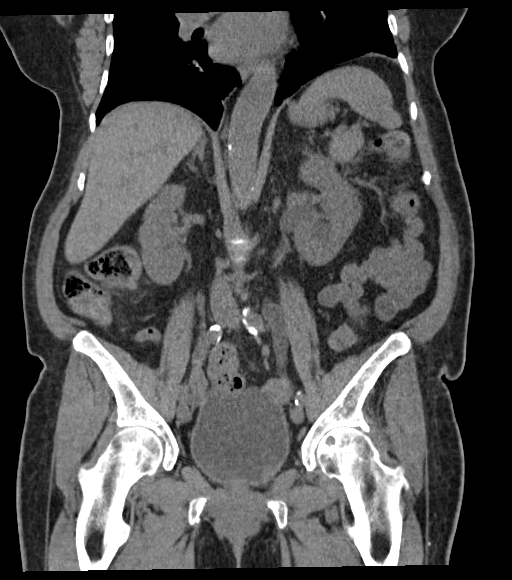

[Series 6: sagittal · sagittal · 0.69mm/px · 1 of 194 slices shown, 2 images]
[im 65/194  soft-tissue]
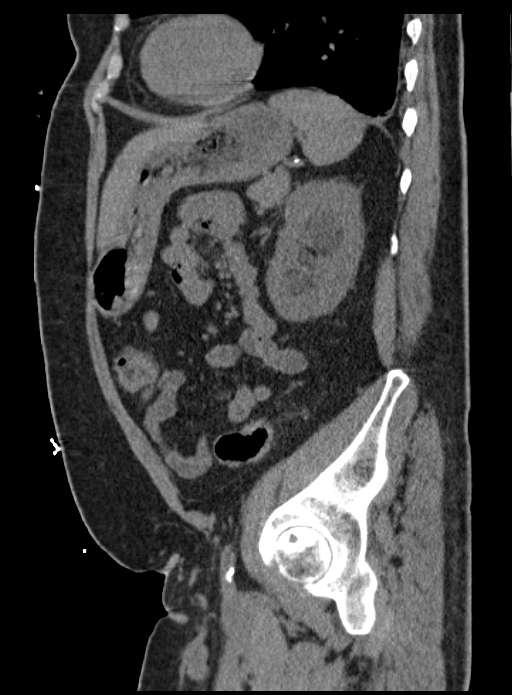
[im 65/194  bone]
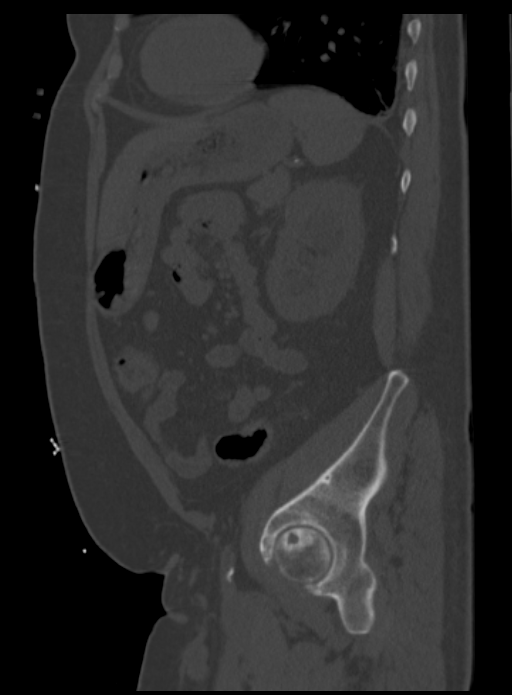

[10 of 46 positions shown; findings below may reference images not displayed]

FINDINGS: There is cardiomegaly. Calcific aortic and coronary atherosclerosis
is identified. No pleural or pericardial effusion. Mild dependent
atelectasis is noted.

There is mild to moderate left hydronephrosis on the left with
dilatation of the left ureter identified. There is abnormal soft
tissue attenuation at the left ureterovesical junction and along the
left side of the urinary bladder worrisome for carcinoma. No urinary
tract stones are identified on the right or left. Exophytic cyst off
the left kidney measuring 5.7 cm in diameter is unchanged.

The liver, gallbladder, adrenal glands, spleen and pancreas are
unremarkable. No lymphadenopathy or fluid is seen in the abdomen or
pelvis. Extensive aortoiliac atherosclerosis without aneurysm is
identified. The patient is status post hysterectomy.

Scattered colonic diverticula are seen but there is no evidence
diverticulitis. The colon is otherwise normal appearance. The
stomach, small bowel and appendix appear normal.

No lytic or sclerotic bony lesion is seen. Mild appearing lower
lumbar spondylosis is noted.
IMPRESSION: Mild to moderate left hydroureteronephrosis due to a soft tissue
mass at the left UVJ worrisome for carcinoma. Soft tissue
attenuation worrisome for mass is also seen along the left side of
the urinary bladder. There are no urinary tract stones.

Extensive calcific aortic and coronary atherosclerosis.

Diverticulosis without diverticulitis.

## 2018-06-30 NOTE — Progress Notes (Signed)
MRN : [id number]  Barbara [name] is a 77 y.o. (Oct 23, 1940) female who presents with chief complaint of  Chief Complaint  Patient presents with  . Follow-up    3 month HDA f/u  .  History of Present Illness:   The patient returns to the office for followup of their dialysis access.   She is s/p thrombectomy with angioplasty of the arterial and venous segments on 05/12/2018.  The function of the access has been stable. The patient denies increased bleeding time or increased recirculation. Patient denies difficulty with cannulation. The patient denies hand pain or other symptoms consistent with steal phenomena.  No significant arm swelling.  The patient denies redness or swelling at the access site. The patient denies fever or chills at home or while on dialysis.  The patient denies amaurosis fugax or recent TIA symptoms. There are no recent neurological changes noted. The patient denies claudication symptoms or rest pain symptoms. The patient denies history of DVT, PE or superficial thrombophlebitis. The patient denies recent episodes of angina or shortness of breath.        No outpatient medications have been marked as taking for the 06/30/18 encounter (Office Visit) with Delana Meyer, Dolores Lory, MD.    Past Medical History:  Diagnosis Date  . Adult celiac disease   . Anemia associated with chronic renal failure 2017   blood transfusion last week 10/17  . Arthritis   . Asthma   . Cancer ([place]) 2017   bladder  . Chronic kidney disease   . 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  dr. Candiss Norse and dr. Holley Raring follow her  . COPD (chronic obstructive pulmonary disease) ([place])   . Coronary artery disease   . Diabetes mellitus without complication ([place])   . Dialysis patient ([place])    Tues, Thurs, Sat  . Dyspnea    with exertion  . Elevated lipids   . GERD (gastroesophageal reflux disease)   . Hematuria   . Hypertension   . Lower back pain   . Neuropathy   .  Oxygen dependent    at hs  . Personal history of chemotherapy 2017   bladder ca  . Personal history of radiation therapy 2017   bladder ca  . Sleep apnea    uses cpap  . Urinary obstruction 01/2016    Past Surgical History:  Procedure Laterality Date  . A/V SHUNT INTERVENTION N/A 05/12/2018   Procedure: A/V SHUNT INTERVENTION;  Surgeon: Algernon Huxley, MD;  Location: [place] [facility];  Service: Cardiovascular;  Laterality: N/A;  . ABDOMINAL HYSTERECTOMY    . AV FISTULA PLACEMENT Left 02/17/2017   Procedure: INSERTION OF ARTERIOVENOUS (AV) GORE-TEX GRAFT ARM(BRACHIAL AXILLARY);  Surgeon: Katha Cabal, MD;  Location: [facility];  Service: Vascular;  Laterality: Left;  . CYSTOSCOPY W/ RETROGRADES Bilateral 02/17/2016   Procedure: CYSTOSCOPY WITH RETROGRADE PYELOGRAM;  Surgeon: Hollice Espy, MD;  Location: [facility];  Service: Urology;  Laterality: Bilateral;  . CYSTOSCOPY W/ RETROGRADES Bilateral 10/12/2016   Procedure: CYSTOSCOPY WITH RETROGRADE PYELOGRAM;  Surgeon: Hollice Espy, MD;  Location: [facility];  Service: Urology;  Laterality: Bilateral;  . CYSTOSCOPY W/ RETROGRADES Bilateral 06/09/2017   Procedure: CYSTOSCOPY WITH RETROGRADE PYELOGRAM;  Surgeon: Hollice Espy, MD;  Location: [facility];  Service: Urology;  Laterality: Bilateral;  . CYSTOSCOPY W/ RETROGRADES Bilateral 10/07/2017   Procedure: CYSTOSCOPY WITH RETROGRADE PYELOGRAM;  Surgeon: Hollice Espy, MD;  Location: [facility];  Service: Urology;  Laterality:  Bilateral;  . CYSTOSCOPY W/ URETERAL STENT PLACEMENT Left 05/12/2016   Procedure: CYSTOSCOPY WITH STENT REPLACEMENT;  Surgeon: Hollice Espy, MD;  Location: [facility];  Service: Urology;  Laterality: Left;  . CYSTOSCOPY W/ URETERAL STENT PLACEMENT Left 10/12/2016   Procedure: CYSTOSCOPY WITH STENT REPLACEMENT;  Surgeon: Hollice Espy, MD;  Location: [facility];  Service: Urology;  Laterality: Left;  . CYSTOSCOPY WITH BIOPSY N/A 06/09/2017   Procedure: CYSTOSCOPY WITH  BLADDER BIOPSY;  Surgeon: Hollice Espy, MD;  Location: [facility];  Service: Urology;  Laterality: N/A;  . CYSTOSCOPY WITH STENT PLACEMENT Left 01/21/2016   Procedure: CYSTOSCOPY WITH double J STENT PLACEMENT;  Surgeon: Franchot Gallo, MD;  Location: [facility];  Service: Urology;  Laterality: Left;  . CYSTOSCOPY WITH STENT PLACEMENT Right 10/12/2016   Procedure: CYSTOSCOPY WITH STENT PLACEMENT;  Surgeon: Hollice Espy, MD;  Location: [facility];  Service: Urology;  Laterality: Right;  . DIALYSIS/PERMA CATHETER INSERTION N/A 11/20/2016   Procedure: Dialysis/Perma Catheter Insertion;  Surgeon: Algernon Huxley, MD;  Location: [place] [facility];  Service: Cardiovascular;  Laterality: N/A;  . DIALYSIS/PERMA CATHETER REMOVAL N/A 03/30/2017   Procedure: DIALYSIS/PERMA CATHETER REMOVAL;  Surgeon: Katha Cabal, MD;  Location: [place] [facility];  Service: Cardiovascular;  Laterality: N/A;  . KIDNEY SURGERY  01/21/2016   IR NEPHROSTOMY PLACEMENT LEFT   . PERIPHERAL VASCULAR CATHETERIZATION N/A 04/07/2016   Procedure: Glori Luis Cath Insertion;  Surgeon: Katha Cabal, MD;  Location: [place] [facility];  Service: Cardiovascular;  Laterality: N/A;  . PERIPHERAL VASCULAR THROMBECTOMY Left 06/02/2017   Procedure: PERIPHERAL VASCULAR THROMBECTOMY;  Surgeon: Algernon Huxley, MD;  Location: [place] [facility];  Service: Cardiovascular;  Laterality: Left;  . PERIPHERAL VASCULAR THROMBECTOMY Left 01/28/2018   Procedure: PERIPHERAL VASCULAR THROMBECTOMY;  Surgeon: Katha Cabal, MD;  Location: [place] [facility];  Service: Cardiovascular;  Laterality: Left;  . PERIPHERAL VASCULAR THROMBECTOMY Left 03/14/2018   Procedure: PERIPHERAL VASCULAR THROMBECTOMY;  Surgeon: Algernon Huxley, MD;  Location: [place] [facility];  Service: Cardiovascular;  Laterality: Left;  . PERIPHERAL VASCULAR THROMBECTOMY Left 03/16/2018   Procedure: PERIPHERAL VASCULAR THROMBECTOMY;  Surgeon: Katha Cabal, MD;  Location: [place] [facility];  Service: Cardiovascular;  Laterality: Left;  . PORTACATH PLACEMENT Right   . ROTATOR CUFF REPAIR Left   . TEMPORARY DIALYSIS CATHETER N/A 05/11/2018   Procedure: TEMPORARY DIALYSIS CATHETER;  Surgeon: Katha Cabal, MD;  Location: [place] [facility];  Service: Cardiovascular;  Laterality: N/A;  . TRANSURETHRAL RESECTION OF BLADDER TUMOR N/A 02/17/2016   Procedure: TRANSURETHRAL RESECTION OF BLADDER TUMOR (TURBT)-LARGE;  Surgeon: Hollice Espy, MD;  Location: [facility];  Service: Urology;  Laterality: N/A;  . TRANSURETHRAL RESECTION OF BLADDER TUMOR N/A 10/07/2017   Procedure: TRANSURETHRAL RESECTION OF BLADDER TUMOR (TURBT)-small;  Surgeon: Hollice Espy, MD;  Location: [facility];  Service: Urology;  Laterality: N/A;  . URETEROSCOPY Left 02/17/2016   Procedure: URETEROSCOPY;  Surgeon: Hollice Espy, MD;  Location: [facility];  Service: Urology;  Laterality: Left;  . URETEROSCOPY Right 10/12/2016   Procedure: URETEROSCOPY;  Surgeon: Hollice Espy, MD;  Location: [facility];  Service: Urology;  Laterality: Right;    Social History Social History   Tobacco Use  . Smoking status: Former Smoker    Types: Cigarettes    Last attempt to quit: 07/20/2002    Years since quitting: 15.9  . Smokeless tobacco: Never Used  . Tobacco comment: 01/22/2016   "  quit smoking many years  ago "  Substance Use Topics  . Alcohol use: No  . Drug use: No    Family History Family History  Problem Relation Age of Onset  . Diabetes Mother   . Cancer Father   . Breast cancer Neg Hx   . Kidney disease Neg Hx     Allergies  Allergen Reactions  . Glipizide Er Other (See Comments)    Hypoglycemia   . Percocet [Oxycodone-Acetaminophen] Hives     REVIEW OF SYSTEMS (Negative unless checked)  Constitutional: [] Weight loss  [] Fever  [] Chills Cardiac: [] Chest pain   [] Chest pressure   [] Palpitations   [] Shortness of breath when laying flat   [] Shortness of breath with exertion. Vascular:   [] Pain in legs with walking   [] Pain in legs at rest  [] History of DVT   [] Phlebitis   [] Swelling in legs   [] Varicose veins   [] Non-healing ulcers Pulmonary:   [] Uses home oxygen   [] Productive cough   [] Hemoptysis   [] Wheeze  [] COPD   [] Asthma Neurologic:  [] Dizziness   [] Seizures   [] History of stroke   [] History of TIA  [] Aphasia   [] Vissual changes   [] Weakness or numbness in arm   [] Weakness or numbness in leg Musculoskeletal:   [] Joint swelling   [] Joint pain   [] Low back pain Hematologic:  [] Easy bruising  [] Easy bleeding   [] Hypercoagulable state   [] Anemic Gastrointestinal:  [] Diarrhea   [] Vomiting  [] Gastroesophageal reflux/heartburn   [] Difficulty swallowing. Genitourinary:  [x] Chronic kidney disease   [] Difficult urination  [] Frequent urination   [] Blood in urine Skin:  [] Rashes   [] Ulcers  Psychological:  [] History of anxiety   []  History of major depression.  Physical Examination  Vitals:   06/30/18 1536  BP: (!) 129/56  Pulse: 84  Resp: 20  Weight: 190 lb (86.2 kg)  Height: 5' 8"  (1.727 m)   Body mass index is 28.89 kg/m. Gen: WD/WN, NAD Head: [place]/AT, No temporalis wasting.  Ear/Nose/Throat: Hearing grossly intact, nares w/o erythema or drainage Eyes: PER, EOMI, sclera nonicteric.  Neck: Supple, no large masses.   Pulmonary:  Good air movement, no audible wheezing bilaterally, no use of accessory muscles.  Cardiac: RRR, no JVD Vascular: left brachial axillary graft good thrill good bruit Vessel Right Left  Radial Palpable Palpable  Ulnar Palpable Palpable  Brachial Palpable Palpable  Gastrointestinal: Non-distended. No guarding/no peritoneal signs.  Musculoskeletal: M/S 5/5 throughout.  No deformity or atrophy.  Neurologic: CN 2-12 intact. Symmetrical.  Speech is fluent. Motor exam as listed above. Psychiatric: Judgment intact, Mood & affect appropriate for pt's clinical situation. Dermatologic: No rashes or ulcers noted.  No changes consistent with  cellulitis. Lymph : No lichenification or skin changes of chronic lymphedema.  CBC Lab Results  Component Value Date   WBC 3.8 (L) 05/12/2018   HGB 11.4 (L) 05/12/2018   HCT 36.3 05/12/2018   MCV 96.8 05/12/2018   PLT 140 (L) 05/12/2018    BMET    Component Value Date/Time   NA 139 05/12/2018 0706   NA 138 11/04/2016 1453   NA 138 08/07/2012 0433   K 3.3 (L) 05/12/2018 0706   K 3.2 (L) 10/30/2014 1410   CL 99 05/12/2018 0706   CL 104 08/07/2012 0433   CO2 28 05/12/2018 0706   CO2 25 08/07/2012 0433   GLUCOSE 93 05/12/2018 0706   GLUCOSE 172 (H) 08/07/2012 0433   BUN 41 (H) 05/12/2018 0706   BUN 69 (H) 11/04/2016 1453   BUN  63 (H) 08/07/2012 0433   CREATININE 8.34 (H) 05/12/2018 0706   CREATININE 2.48 (H) 08/07/2012 0433   CALCIUM 8.0 (L) 05/12/2018 0706   CALCIUM 9.1 08/07/2012 0433   GFRNONAA 4 (L) 05/12/2018 0706   GFRNONAA 19 (L) 08/07/2012 0433   GFRAA 5 (L) 05/12/2018 0706   GFRAA 22 (L) 08/07/2012 0433   CrCl cannot be calculated (Patient's most recent lab result is older than the maximum 21 days allowed.).  COAG Lab Results  Component Value Date   INR 1.08 02/08/2017   INR 1.23 01/25/2016   INR 1.38 01/24/2016    Radiology No results found.  Assessment/Plan 1. Complication from renal dialysis device, sequela Recommend:  The patient is doing well and currently has adequate dialysis access. The patient's dialysis center is not reporting any access issues. Flow pattern is stable when compared to the prior ultrasound.  The patient should have a duplex ultrasound of the dialysis access in 6 months. The patient will follow-up with me in the office after each ultrasound   - VAS [place] (AVF, AVG); Future  2. End stage renal disease ([place]) Continue dialysis without interruption   3. Atherosclerosis of abdominal aorta ([facility])  Recommend:  The patient has evidence of atherosclerosis of the lower extremities with claudication.  The  patient does not voice lifestyle limiting changes at this point in time.  Noninvasive studies do not suggest clinically significant change.  No invasive studies, angiography or surgery at this time The patient should continue walking and begin a more formal exercise program.  The patient should continue antiplatelet therapy and aggressive treatment of the lipid abnormalities  No changes in the patient's medications at this time  The patient should continue wearing graduated compression socks 10-15 mmHg strength to control the mild edema.    4. Essential hypertension Continue antihypertensive medications as already ordered, these medications have been reviewed and there are no changes at this time.   5. CAD in native artery Continue cardiac and antihypertensive medications as already ordered and reviewed, no changes at this time.  Continue statin as ordered and reviewed, no changes at this time  Nitrates PRN for chest pain   6. Chronic obstructive pulmonary disease, unspecified COPD type ([place]) Continue pulmonary medications and aerosols as already ordered, these medications have been reviewed and there are no changes at this time.      [name], MD  06/30/2018 3:45 PM

## 2018-07-01 IMAGING — US IR NEPHROSTOMY PLACEMENT LEFT
1 series · 5 of 5 positions shown · non-contrast
Comparison: None.

INDICATION: 75-year-old female with a history of urosepsis and obstructed left
ureterovesical junction secondary to tumor.

Tumor was obstructing stent placement on cystoscopy.
EXAM:
IR NEPHROSTOMY PLACEMENT LEFT

[Series 1: ir (id) (id)/(id)/(id) ir · 5 of 5 slices shown]
[im 1/5]
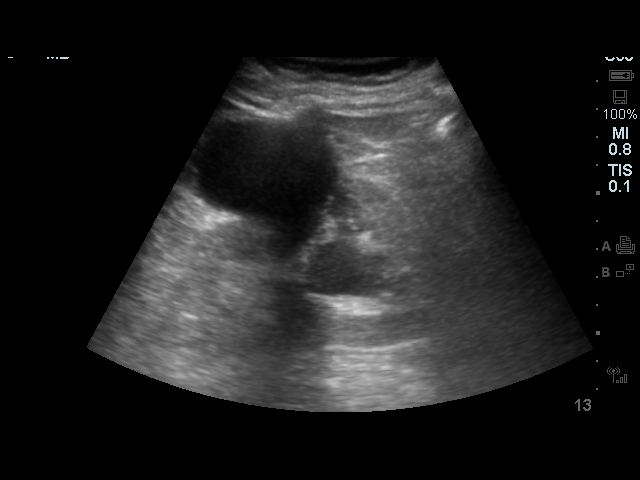
[im 2/5]
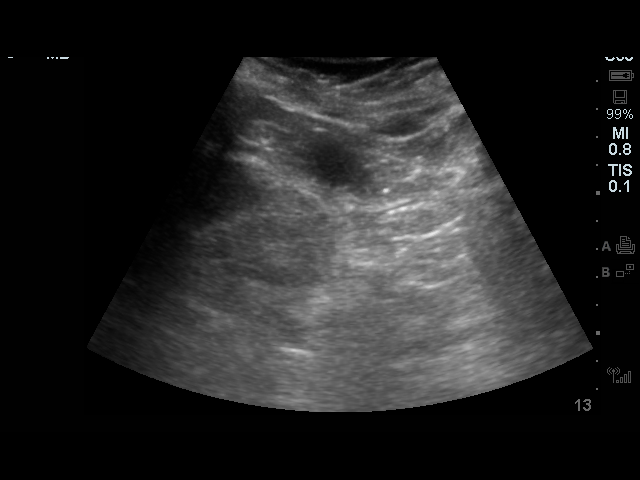
[im 3/5]
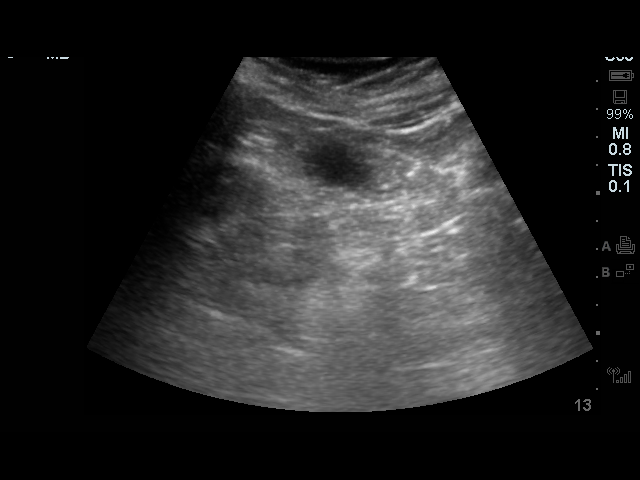
[im 4/5]
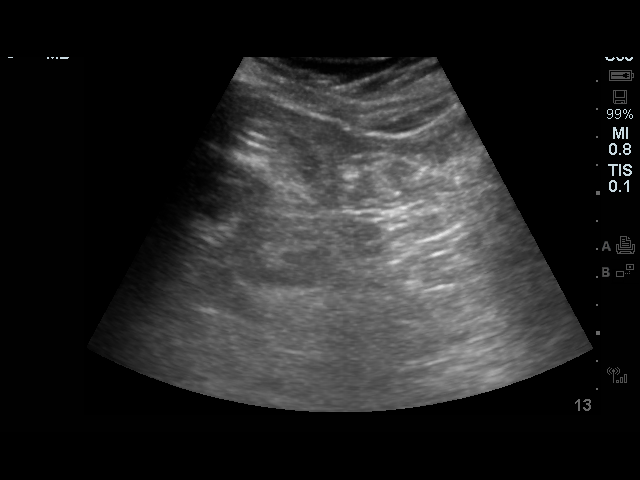
[im 5/5]
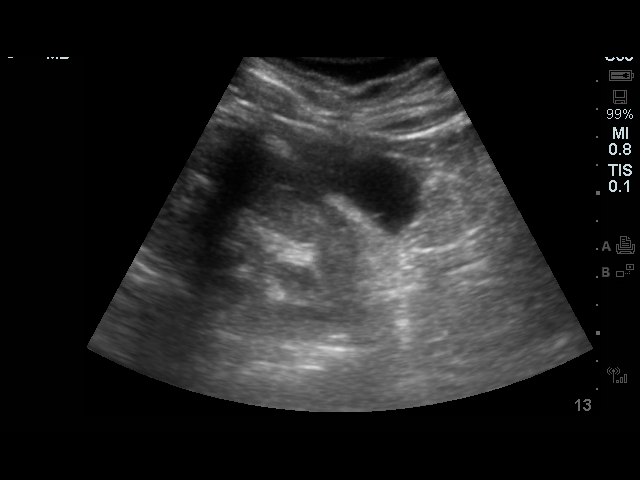

[5 of 5 positions shown; findings below may reference images not displayed]

MEDICATIONS:
Hospital administered antibiotics within 1 hour of procedure.

ANESTHESIA/SEDATION:
Fentanyl 25 mcg IV; Versed 0.5 mg IV

Moderate Sedation Time:  15

The patient was continuously monitored during the procedure by the
interventional radiology nurse under my direct supervision.

CONTRAST:  10 cc - administered into the collecting system(s)

FLUOROSCOPY TIME:  Fluoroscopy Time: 2 minutes 0 seconds (11 mGy).

COMPLICATIONS:
None

PROCEDURE:
Informed written consent was obtained from the patient's family
after a thorough discussion of the procedural risks, benefits and
alternatives. All questions were addressed. Maximal Sterile Barrier
Technique was utilized including caps, mask, sterile gowns, sterile
gloves, sterile drape, hand hygiene and skin antiseptic. A timeout
was performed prior to the initiation of the procedure.

Patient positioned prone position on the fluoroscopy table.
Ultrasound survey of the left flank was performed with images stored
and sent to PACs.

The patient was then prepped and draped in the usual sterile
fashion. 1% lidocaine was used to anesthetize the skin and
subcutaneous tissues for local anesthesia.

A Chiba needle was then used to access a posterior inferior calyx
with ultrasound guidance. With spontaneous urine returned through
the needle, passage of an 018 micro wire into the collecting system
was performed under fluoroscopy.

A small incision was made with an 11 blade scalpel, and the needle
was removed from the wire.

An Accustick system was then advanced over the wire into the
collecting system under fluoroscopy. The metal stiffener and inner
dilator were removed, and then a sample of fluid was aspirated
through the 4 French outer sheath.

Bentson wire was passed into the collecting system and the sheath
removed. Ten French dilation of the soft tissues was performed.

Using modified Seldinger technique, a 10 French pigtail catheter
drain was placed over the Bentson wire.

Wire and inner stiffener removed, and the pigtail was formed in the
collecting system.

Small amount of contrast confirmed position of the catheter.

Patient tolerated the procedure well and remained hemodynamically
stable throughout.

No complications were encountered and no significant blood loss
encountered
IMPRESSION: Status post left percutaneous nephrostomy placement.

## 2018-07-06 ENCOUNTER — Ambulatory Visit: Payer: Medicare Other | Admitting: Orthotics

## 2018-07-06 DIAGNOSIS — M2011 Hallux valgus (acquired), right foot: Secondary | ICD-10-CM

## 2018-07-06 DIAGNOSIS — E1142 Type 2 diabetes mellitus with diabetic polyneuropathy: Secondary | ICD-10-CM

## 2018-07-06 DIAGNOSIS — M2012 Hallux valgus (acquired), left foot: Secondary | ICD-10-CM

## 2018-07-20 NOTE — Progress Notes (Signed)

## 2018-07-25 ENCOUNTER — Ambulatory Visit
Admission: RE | Admit: 2018-07-25 | Discharge: 2018-07-25 | Disposition: A | Discharge status: Home / Self Care | Payer: Medicare Other | Source: Ambulatory Visit | Attending: Internal Medicine | Admitting: Internal Medicine

## 2018-07-25 DIAGNOSIS — R590 Localized enlarged lymph nodes: Secondary | ICD-10-CM | POA: Insufficient documentation

## 2018-07-25 DIAGNOSIS — I2510 Atherosclerotic heart disease of native coronary artery without angina pectoris: Secondary | ICD-10-CM | POA: Diagnosis not present

## 2018-07-25 DIAGNOSIS — N1330 Unspecified hydronephrosis: Secondary | ICD-10-CM | POA: Insufficient documentation

## 2018-07-25 DIAGNOSIS — I700 Atherosclerosis of aorta: Secondary | ICD-10-CM | POA: Diagnosis not present

## 2018-07-25 DIAGNOSIS — C670 Malignant neoplasm of trigone of bladder: Secondary | ICD-10-CM | POA: Insufficient documentation

## 2018-07-25 DIAGNOSIS — N139 Obstructive and reflux uropathy, unspecified: Secondary | ICD-10-CM | POA: Insufficient documentation

## 2018-07-25 LAB — GLUCOSE, CAPILLARY: Glucose-Capillary: 85 mg/dL (ref 70–99)

## 2018-07-25 MED ORDER — FLUDEOXYGLUCOSE F - 18 (FDG) INJECTION
9.8000 | Freq: Once | INTRAVENOUS | Status: AC | PRN
  Administered 2018-07-25: 10.12 via INTRAVENOUS

## 2018-07-26 ENCOUNTER — Other Ambulatory Visit (INDEPENDENT_AMBULATORY_CARE_PROVIDER_SITE_OTHER): Payer: Self-pay | Admitting: Nurse Practitioner

## 2018-07-26 ENCOUNTER — Telehealth (INDEPENDENT_AMBULATORY_CARE_PROVIDER_SITE_OTHER): Payer: Self-pay

## 2018-07-26 MED ORDER — CEFAZOLIN SODIUM-DEXTROSE 1-4 GM/50ML-% IV SOLN: 1.0000 g | Freq: Once | INTRAVENOUS | Status: DC

## 2018-07-26 NOTE — Telephone Encounter (Signed)
Spoke with the patient and gave her the information regarding her left arm graft declot scheduled for 07/27/2018. I explained that the patient will need to be at the [facility] at 2:30 pm and NPO as well.

## 2018-07-27 ENCOUNTER — Encounter: Payer: Self-pay | Admitting: Internal Medicine

## 2018-07-27 ENCOUNTER — Other Ambulatory Visit: Payer: Self-pay

## 2018-07-27 ENCOUNTER — Inpatient Hospital Stay: Payer: Medicare Other | Attending: Internal Medicine

## 2018-07-27 ENCOUNTER — Observation Stay
Admission: RE | Admit: 2018-07-27 | Discharge: 2018-07-30 | Disposition: A | Discharge status: Home / Self Care | Payer: Medicare Other | Attending: Internal Medicine | Admitting: Internal Medicine

## 2018-07-27 ENCOUNTER — Encounter
Admission: RE | Disposition: A | Discharge status: Home / Self Care | Payer: Self-pay | Source: Home / Self Care | Attending: Internal Medicine

## 2018-07-27 ENCOUNTER — Inpatient Hospital Stay (HOSPITAL_BASED_OUTPATIENT_CLINIC_OR_DEPARTMENT_OTHER): Payer: Medicare Other | Admitting: Internal Medicine

## 2018-07-27 VITALS — BP 175/80 | HR 72 | Resp 16 | Wt 191.0 lb

## 2018-07-27 DIAGNOSIS — I2510 Atherosclerotic heart disease of native coronary artery without angina pectoris: Secondary | ICD-10-CM

## 2018-07-27 DIAGNOSIS — R52 Pain, unspecified: Secondary | ICD-10-CM

## 2018-07-27 DIAGNOSIS — J449 Chronic obstructive pulmonary disease, unspecified: Secondary | ICD-10-CM

## 2018-07-27 DIAGNOSIS — N3041 Irradiation cystitis with hematuria: Secondary | ICD-10-CM | POA: Insufficient documentation

## 2018-07-27 DIAGNOSIS — N186 End stage renal disease: Secondary | ICD-10-CM

## 2018-07-27 DIAGNOSIS — E1122 Type 2 diabetes mellitus with diabetic chronic kidney disease: Secondary | ICD-10-CM | POA: Diagnosis not present

## 2018-07-27 DIAGNOSIS — Z452 Encounter for adjustment and management of vascular access device: Secondary | ICD-10-CM | POA: Diagnosis not present

## 2018-07-27 DIAGNOSIS — E875 Hyperkalemia: Secondary | ICD-10-CM

## 2018-07-27 DIAGNOSIS — Z8551 Personal history of malignant neoplasm of bladder: Secondary | ICD-10-CM | POA: Insufficient documentation

## 2018-07-27 DIAGNOSIS — E785 Hyperlipidemia, unspecified: Secondary | ICD-10-CM | POA: Insufficient documentation

## 2018-07-27 DIAGNOSIS — Y832 Surgical operation with anastomosis, bypass or graft as the cause of abnormal reaction of the patient, or of later complication, without mention of misadventure at the time of the procedure: Secondary | ICD-10-CM | POA: Insufficient documentation

## 2018-07-27 DIAGNOSIS — K219 Gastro-esophageal reflux disease without esophagitis: Secondary | ICD-10-CM | POA: Insufficient documentation

## 2018-07-27 DIAGNOSIS — Z992 Dependence on renal dialysis: Secondary | ICD-10-CM | POA: Insufficient documentation

## 2018-07-27 DIAGNOSIS — E1140 Type 2 diabetes mellitus with diabetic neuropathy, unspecified: Secondary | ICD-10-CM | POA: Insufficient documentation

## 2018-07-27 DIAGNOSIS — I120 Hypertensive chronic kidney disease with stage 5 chronic kidney disease or end stage renal disease: Secondary | ICD-10-CM | POA: Insufficient documentation

## 2018-07-27 DIAGNOSIS — Z9221 Personal history of antineoplastic chemotherapy: Secondary | ICD-10-CM | POA: Diagnosis not present

## 2018-07-27 DIAGNOSIS — Z833 Family history of diabetes mellitus: Secondary | ICD-10-CM | POA: Insufficient documentation

## 2018-07-27 DIAGNOSIS — Z885 Allergy status to narcotic agent status: Secondary | ICD-10-CM | POA: Insufficient documentation

## 2018-07-27 DIAGNOSIS — D631 Anemia in chronic kidney disease: Secondary | ICD-10-CM

## 2018-07-27 DIAGNOSIS — Z87891 Personal history of nicotine dependence: Secondary | ICD-10-CM

## 2018-07-27 DIAGNOSIS — M1612 Unilateral primary osteoarthritis, left hip: Secondary | ICD-10-CM | POA: Insufficient documentation

## 2018-07-27 DIAGNOSIS — T82868A Thrombosis of vascular prosthetic devices, implants and grafts, initial encounter: Secondary | ICD-10-CM | POA: Insufficient documentation

## 2018-07-27 DIAGNOSIS — Z888 Allergy status to other drugs, medicaments and biological substances status: Secondary | ICD-10-CM | POA: Insufficient documentation

## 2018-07-27 DIAGNOSIS — G4733 Obstructive sleep apnea (adult) (pediatric): Secondary | ICD-10-CM | POA: Diagnosis not present

## 2018-07-27 DIAGNOSIS — Z90710 Acquired absence of both cervix and uterus: Secondary | ICD-10-CM | POA: Insufficient documentation

## 2018-07-27 DIAGNOSIS — Z79899 Other long term (current) drug therapy: Secondary | ICD-10-CM | POA: Insufficient documentation

## 2018-07-27 DIAGNOSIS — K900 Celiac disease: Secondary | ICD-10-CM | POA: Insufficient documentation

## 2018-07-27 DIAGNOSIS — C670 Malignant neoplasm of trigone of bladder: Secondary | ICD-10-CM | POA: Diagnosis present

## 2018-07-27 DIAGNOSIS — Z9981 Dependence on supplemental oxygen: Secondary | ICD-10-CM | POA: Diagnosis not present

## 2018-07-27 HISTORY — PX: TEMPORARY DIALYSIS CATHETER: CATH118312

## 2018-07-27 LAB — COMPREHENSIVE METABOLIC PANEL
ALK PHOS: 133 U/L — AB (ref 38–126)
ALT: 8 U/L (ref 0–44)
ANION GAP: 12 (ref 5–15)
AST: 15 U/L (ref 15–41)
Albumin: 4 g/dL (ref 3.5–5.0)
BUN: 63 mg/dL — ABNORMAL HIGH (ref 8–23)
CALCIUM: 8.1 mg/dL — AB (ref 8.9–10.3)
CO2: 22 mmol/L (ref 22–32)
Chloride: 111 mmol/L (ref 98–111)
Creatinine, Ser: 13.05 mg/dL — ABNORMAL HIGH (ref 0.44–1.00)
GFR calc Af Amer: 3 mL/min — ABNORMAL LOW (ref >60)
GFR calc non Af Amer: 2 mL/min — ABNORMAL LOW (ref >60)
Glucose, Bld: 88 mg/dL (ref 70–99)
Potassium: 4.5 mmol/L (ref 3.5–5.1)
SODIUM: 145 mmol/L (ref 135–145)
Total Bilirubin: 0.6 mg/dL (ref 0.3–1.2)
Total Protein: 8.1 g/dL (ref 6.5–8.1)

## 2018-07-27 LAB — CBC WITH DIFFERENTIAL/PLATELET
Abs Immature Granulocytes: 0.01 10*3/uL (ref 0.00–0.07)
BASOS PCT: 1 %
Basophils Absolute: 0 10*3/uL (ref 0.0–0.1)
EOS ABS: 0.2 10*3/uL (ref 0.0–0.5)
Eosinophils Relative: 3 %
HEMATOCRIT: 38 % (ref 36.0–46.0)
Hemoglobin: 11.5 g/dL — ABNORMAL LOW (ref 12.0–15.0)
IMMATURE GRANULOCYTES: 0 %
LYMPHS ABS: 1.2 10*3/uL (ref 0.7–4.0)
Lymphocytes Relative: 22 %
MCH: 29.8 pg (ref 26.0–34.0)
MCHC: 30.3 g/dL (ref 30.0–36.0)
MCV: 98.4 fL (ref 80.0–100.0)
Monocytes Absolute: 0.4 10*3/uL (ref 0.1–1.0)
Monocytes Relative: 8 %
Neutro Abs: 3.7 10*3/uL (ref 1.7–7.7)
Neutrophils Relative %: 66 %
Platelets: 176 10*3/uL (ref 150–400)
RBC: 3.86 MIL/uL — ABNORMAL LOW (ref 3.87–5.11)
RDW: 15.4 % (ref 11.5–15.5)
WBC: 5.6 10*3/uL (ref 4.0–10.5)
nRBC: 0 % (ref 0.0–0.2)

## 2018-07-27 LAB — GLUCOSE, CAPILLARY
GLUCOSE-CAPILLARY: 87 mg/dL (ref 70–99)
GLUCOSE-CAPILLARY: 80 mg/dL (ref 70–99)
Glucose-Capillary: 106 mg/dL — ABNORMAL HIGH (ref 70–99)

## 2018-07-27 LAB — POTASSIUM (ARMC VASCULAR LAB ONLY): Potassium (ARMC vascular lab): 5.5 — ABNORMAL HIGH (ref 3.5–5.1)

## 2018-07-27 LAB — MRSA PCR SCREENING: MRSA BY PCR: NEGATIVE

## 2018-07-27 LAB — PHOSPHORUS: Phosphorus: 4.1 mg/dL (ref 2.5–4.6)

## 2018-07-27 SURGERY — TEMPORARY DIALYSIS CATHETER
Anesthesia: Moderate Sedation | Laterality: Left

## 2018-07-27 MED ORDER — CEFAZOLIN SODIUM-DEXTROSE 1-4 GM/50ML-% IV SOLN
INTRAVENOUS | Status: AC
  Filled 2018-07-27: qty 50

## 2018-07-27 MED ORDER — HEPARIN SODIUM (PORCINE) 5000 UNIT/ML IJ SOLN
5000.0000 [IU] | Freq: Three times a day (TID) | INTRAMUSCULAR | Status: DC
  Administered 2018-07-27 – 2018-07-30 (×6): 5000 [IU] via SUBCUTANEOUS
  Filled 2018-07-27 (×6): qty 1

## 2018-07-27 MED ORDER — INSULIN ASPART 100 UNIT/ML ~~LOC~~ SOLN: 0.0000 [IU] | Freq: Three times a day (TID) | SUBCUTANEOUS | Status: DC

## 2018-07-27 MED ORDER — TRAMADOL HCL 50 MG PO TABS: 50.0000 mg | ORAL_TABLET | Freq: Two times a day (BID) | ORAL | Status: DC | PRN

## 2018-07-27 MED ORDER — LATANOPROST 0.005 % OP SOLN
1.0000 [drp] | Freq: Every day | OPHTHALMIC | Status: DC
  Administered 2018-07-27: 1 [drp] via OPHTHALMIC
  Filled 2018-07-27: qty 2.5

## 2018-07-27 MED ORDER — PANTOPRAZOLE SODIUM 40 MG PO TBEC
80.0000 mg | DELAYED_RELEASE_TABLET | Freq: Every day | ORAL | Status: DC
  Administered 2018-07-30: 80 mg via ORAL
  Filled 2018-07-27: qty 2

## 2018-07-27 MED ORDER — CINACALCET HCL 30 MG PO TABS
30.0000 mg | ORAL_TABLET | Freq: Every day | ORAL | Status: DC
  Administered 2018-07-28: 30 mg via ORAL
  Filled 2018-07-27 (×3): qty 1

## 2018-07-27 MED ORDER — OXYBUTYNIN CHLORIDE 5 MG PO TABS
5.0000 mg | ORAL_TABLET | Freq: Three times a day (TID) | ORAL | Status: DC
  Administered 2018-07-27 – 2018-07-30 (×5): 5 mg via ORAL
  Filled 2018-07-27 (×5): qty 1

## 2018-07-27 MED ORDER — LIDOCAINE HCL (PF) 1 % IJ SOLN
INTRAMUSCULAR | Status: DC | PRN
  Administered 2018-07-27: 10 mL via SUBCUTANEOUS

## 2018-07-27 MED ORDER — GABAPENTIN 300 MG PO CAPS: 300.0000 mg | ORAL_CAPSULE | Freq: Every day | ORAL | Status: DC

## 2018-07-27 MED ORDER — ONDANSETRON HCL 4 MG/2ML IJ SOLN: 4.0000 mg | Freq: Four times a day (QID) | INTRAMUSCULAR | Status: DC | PRN

## 2018-07-27 MED ORDER — ALBUTEROL SULFATE (2.5 MG/3ML) 0.083% IN NEBU: 2.5000 mg | INHALATION_SOLUTION | Freq: Four times a day (QID) | RESPIRATORY_TRACT | Status: DC | PRN

## 2018-07-27 MED ORDER — SODIUM CHLORIDE 0.9 % IV SOLN
INTRAVENOUS | Status: DC
  Administered 2018-07-27: 1000 mL via INTRAVENOUS
  Administered 2018-07-29: 14:00:00 via INTRAVENOUS

## 2018-07-27 MED ORDER — FUROSEMIDE 40 MG PO TABS: 40.0000 mg | ORAL_TABLET | ORAL | Status: DC | PRN

## 2018-07-27 MED ORDER — ACETAMINOPHEN 325 MG PO TABS
650.0000 mg | ORAL_TABLET | Freq: Four times a day (QID) | ORAL | Status: DC | PRN
  Administered 2018-07-28: 650 mg via ORAL
  Filled 2018-07-27: qty 2

## 2018-07-27 MED ORDER — FERROUS SULFATE 325 (65 FE) MG PO TABS
325.0000 mg | ORAL_TABLET | Freq: Every day | ORAL | Status: DC
  Administered 2018-07-30: 325 mg via ORAL
  Filled 2018-07-27: qty 1

## 2018-07-27 MED ORDER — ACETAMINOPHEN 650 MG RE SUPP: 650.0000 mg | Freq: Four times a day (QID) | RECTAL | Status: DC | PRN

## 2018-07-27 MED ORDER — VITAMIN D3 25 MCG (1000 UNIT) PO TABS
1000.0000 [IU] | ORAL_TABLET | Freq: Every day | ORAL | Status: DC
  Administered 2018-07-30: 1000 [IU] via ORAL
  Filled 2018-07-27 (×3): qty 1

## 2018-07-27 MED ORDER — HEPARIN SODIUM (PORCINE) 1000 UNIT/ML DIALYSIS: 20.0000 [IU]/kg | INTRAMUSCULAR | Status: DC | PRN

## 2018-07-27 MED ORDER — FUROSEMIDE 40 MG PO TABS
40.0000 mg | ORAL_TABLET | Freq: Every morning | ORAL | Status: DC
  Administered 2018-07-30: 40 mg via ORAL
  Filled 2018-07-27: qty 1

## 2018-07-27 MED ORDER — FENTANYL CITRATE (PF) 100 MCG/2ML IJ SOLN: 12.5000 ug | Freq: Once | INTRAMUSCULAR | Status: DC | PRN

## 2018-07-27 MED ORDER — ACETAMINOPHEN 325 MG PO TABS: 650.0000 mg | ORAL_TABLET | Freq: Four times a day (QID) | ORAL | Status: DC | PRN

## 2018-07-27 MED ORDER — POLYETHYLENE GLYCOL 3350 17 G PO PACK: 17.0000 g | PACK | Freq: Every day | ORAL | Status: DC | PRN

## 2018-07-27 MED ORDER — SODIUM CHLORIDE 0.9% FLUSH
10.0000 mL | Freq: Once | INTRAVENOUS | Status: AC
  Administered 2018-07-27: 10 mL via INTRAVENOUS
  Filled 2018-07-27: qty 10

## 2018-07-27 MED ORDER — INSULIN ASPART 100 UNIT/ML ~~LOC~~ SOLN: 0.0000 [IU] | Freq: Every day | SUBCUTANEOUS | Status: DC

## 2018-07-27 MED ORDER — CALCIUM ACETATE (PHOS BINDER) 667 MG PO CAPS
1334.0000 mg | ORAL_CAPSULE | Freq: Three times a day (TID) | ORAL | Status: DC
  Administered 2018-07-28 – 2018-07-30 (×3): 1334 mg via ORAL
  Filled 2018-07-27 (×3): qty 2

## 2018-07-27 MED ORDER — ATORVASTATIN CALCIUM 20 MG PO TABS
40.0000 mg | ORAL_TABLET | Freq: Every day | ORAL | Status: DC
  Administered 2018-07-28 – 2018-07-29 (×2): 40 mg via ORAL
  Filled 2018-07-27 (×2): qty 2

## 2018-07-27 MED ORDER — MIRABEGRON ER 25 MG PO TB24
25.0000 mg | ORAL_TABLET | Freq: Every day | ORAL | Status: DC
  Filled 2018-07-27: qty 1

## 2018-07-27 MED ORDER — CHLORHEXIDINE GLUCONATE CLOTH 2 % EX PADS
6.0000 | MEDICATED_PAD | Freq: Every day | CUTANEOUS | Status: DC
  Administered 2018-07-28 – 2018-07-30 (×3): 6 via TOPICAL

## 2018-07-27 MED ORDER — ONDANSETRON HCL 4 MG PO TABS: 4.0000 mg | ORAL_TABLET | Freq: Four times a day (QID) | ORAL | Status: DC | PRN

## 2018-07-27 MED ORDER — HEPARIN SOD (PORK) LOCK FLUSH 100 UNIT/ML IV SOLN
500.0000 [IU] | Freq: Once | INTRAVENOUS | Status: AC
  Administered 2018-07-27: 500 [IU] via INTRAVENOUS
  Filled 2018-07-27: qty 5

## 2018-07-27 SURGICAL SUPPLY — 2 items
GUIDEWIRE AMPLATZ SHORT (WIRE) ×2 IMPLANT
KIT DIALYSIS CATH TRI 30X13 (CATHETERS) ×2 IMPLANT

## 2018-07-27 NOTE — Progress Notes (Signed)
Post HD Treatment  Patient tolerated treatment well. Net UF is 500 mL and BVP was 29.1. All blood was returned to patient. Patient's catheter site has old and new drainage. Catheter dressing to be changed tomorrow during treatment. Report called to primary RN Aniceto Boss, all questions answered.     07/27/18 2235  Hand-Off documentation  Report given to (Full Name) Aniceto Boss, RN  Report received from (Full Name) Stephannie Peters, RN  Vital Signs  Temp 98.4 F (36.9 C)  Temp Source Oral  Pulse Rate 70  Pulse Rate Source Monitor  Resp 15  BP (!) 172/91  BP Location Right Arm  BP Method Automatic  Patient Position (if appropriate) Lying  Oxygen Therapy  SpO2 96 %  O2 Device Room Air  Pain Assessment  Pain Scale 0-10  Pain Score 0  Dialysis Weight  Weight 86.1 kg  Type of Weight Post-Dialysis  Post-Hemodialysis Assessment  Rinseback Volume (mL) 250 mL  KECN 195 V  Dialyzer Clearance Lightly streaked  Duration of HD Treatment -hour(s) 3 hour(s)  Hemodialysis Intake (mL) 550 mL  UF Total -Machine (mL) 1060 mL  Net UF (mL) 510 mL  Tolerated HD Treatment Yes  Post-Hemodialysis Comments Pt tolerated treatment well  Note  Observations Dialysis Catheter-No complications; Heparin-locked; Filled 1.8 cc both lumens, third lumen saline locked; Lumens capped; line labels applied

## 2018-07-27 NOTE — Progress Notes (Signed)
Post HD Assessment    07/27/18 2233  Neurological  Level of Consciousness Alert  Orientation Level Oriented X4  Respiratory  Respiratory Pattern Regular;Unlabored;Asymmetrical  Chest Assessment Chest expansion symmetrical  Bilateral Breath Sounds Clear  Cardiac  Pulse Regular  Heart Sounds S1, S2;No adventitious heart sounds  Jugular Venous Distention (JVD) No  ECG Monitor Yes  Cardiac Rhythm NSR;Heart block  Heart Block Type 1st degree AVB  Vascular  R Radial Pulse +2  L Radial Pulse +2  R Dorsalis Pedis Pulse +2  L Dorsalis Pedis Pulse +2  Integumentary  Integumentary (WDL) X  Skin Color Appropriate for ethnicity  Skin Condition Dry  Skin Integrity Catheter entry/exit site  Musculoskeletal  Musculoskeletal (WDL) WDL  GU Assessment  Genitourinary (WDL) X (HD patient)  Psychosocial  Psychosocial (WDL) WDL

## 2018-07-27 NOTE — Progress Notes (Signed)
[place], [place] 07/27/18  Subjective:   Patient known to our practice from outpatient dialysis.  She presents today for inability to get dialysis because of a clotted access.  Potassium was elevated therefore declot has been postponed.  She will get hemodialysis via a temporary right femoral dialysis catheter that was placed by Dr. Delana Meyer today.  She denies any shortness of breath.  No nausea or vomiting.  No fevers or chills.  Complains of left hip pain with walking.  Objective:  Vital signs in last 24 hours:  Temp:  [98.2 F (36.8 C)] 98.2 F (36.8 C) (01/08 1446) Pulse Rate:  [69-72] 69 (01/08 1446) Resp:  [16-20] 20 (01/08 1446) BP: (168-175)/(80-90) 168/90 (01/08 1446) SpO2:  [94 %] 94 % (01/08 1446) Weight:  [86.6 kg] 86.6 kg (01/08 1446)  Weight change:  Filed Weights   07/27/18 1446  Weight: 86.6 kg    Intake/Output:   No intake or output data in the 24 hours ending 07/27/18 1719   Physical Exam: General:  Laying in the bed, no acute distress  HEENT  anicteric, moist oral mucous membranes  Neck  supple  Pulm/lungs  normal breathing effort, room air, clear to auscultation  CVS/Heart  regular, no rub  Abdomen:   Soft, nontender  Extremities:  No peripheral edema  Neurologic:  Alert, oriented  Skin:  No acute rashes  Access:  Right femoral dialysis catheter       Basic Metabolic Panel:  Recent Labs  Lab 07/27/18 1312  NA 145  K 4.5  CL 111  CO2 22  GLUCOSE 88  BUN 63*  CREATININE 13.05*  CALCIUM 8.1*     CBC: Recent Labs  Lab 07/27/18 1312  WBC 5.6  NEUTROABS 3.7  HGB 11.5*  HCT 38.0  MCV 98.4  PLT 176      Lab Results  Component Value Date   HEPBSAG Negative 05/13/2018      Microbiology:  No results found for this or any previous visit (from the past 240 hour(s)).  Coagulation Studies: No results for input(s): LABPROT, INR in the last 72 hours.  Urinalysis: No results for input(s):  COLORURINE, LABSPEC, PHURINE, GLUCOSEU, HGBUR, BILIRUBINUR, KETONESUR, PROTEINUR, UROBILINOGEN, NITRITE, LEUKOCYTESUR in the last 72 hours.  Invalid input(s): APPERANCEUR    Imaging: No results found.   Medications:   . sodium chloride 1,000 mL (07/27/18 1458)  . ceFAZolin    .  ceFAZolin (ANCEF) IV      fentaNYL (SUBLIMAZE) injection, ondansetron (ZOFRAN) IV  Assessment/ Plan:  78 y.o. female with end stage renal disease on hemodialysis, hypertension, obesity, obstructive sleep apnea, GERD, hyperlipidemia, diabetes mellitus type II, coronary artery disease, COPD, celiac disease, bladder cancer followed by Dr. Erlene Quan and Dr. Rogue Bussing.  Status post TURBT.  Not candidate for cystectomy.  Chemotherapy September 2017 (Did not tolerate)  [facility] MWF [place] Left AVF  1. End stage renal disease with complication of dialysis access Nonfunctioning AVF at admission, -Plan for declot on Friday In the meantime, dialysis via right femoral temporary catheter.  Dialysis today and tomorrow morning  2.  Hyperkalemia Expected to correct with hemodialysis  3.  Anemia of chronic kidney disease Hemoglobin 11.5.  We will continue to monitor EPO as outpatient   4.  Hypertension with CKD Continue home antihypertensive regimen and monitor.   LOS: 0 Shavon Ashmore 1/8/20205:19 PM  [place], [place]  Note: This note was prepared with Dragon dictation.  Any transcription errors are unintentional

## 2018-07-27 NOTE — Progress Notes (Signed)
[facility] OFFICE PROGRESS NOTE  Patient Care Team: [name], MD as PCP - General (Internal Medicine)  Cancer Staging No matching staging information was found for the patient.   Oncology History    #AUG 2017 TURBT- [Dr.Brandon] Large nodular proximally 5 cm bladder mass involving the left hemitrigone extending up the left lateral bladder wall, 2 discrete nodules with bridge of abnormal tissue; Tumor clinically T2. TURBT- Tumor invades muscularis propria [detrusor muscle]. NOT candidate for cystectomy.  # SEP 2017- RT- GEM weekly x2 [gem discontinued sec to poor tolerance]; finished RT dec 2017; Nov 2018- cysto- NEG path [Dr.Brandon]  # ESRD on HD-Dr.Singh / C.diff [aug 2017]  DIAGNOSIS: Muscle invasive bladder ca  STAGE:  II       ;GOALS: curative  CURRENT/MOST RECENT THERAPY : surveillaince      Cancer of trigone of urinary bladder ([place])       INTERVAL HISTORY:  Barbara [name] 78 y.o.  female pleasant patient above history of  muscle invasive bladder cancer [not a candidate for cystectomy] status post definitive radiation-currently on surveillance here for follow-up/review the results of the PET scan.  Patient denies any blood in urine.  Has chronic mild increased frequency of urination.  Patient had cystoscopy in December 2019 this was negative for malignancy.  Patient states that she has not had dialysis this week because of vascular access issues.  She is awaiting to see vascular surgery this afternoon.  Otherwise denies any abdominal pain nausea vomiting.  Denies any bone pain.  Continues to have chronic mild fatigue.  Chronic leg swelling.   Review of Systems  Constitutional: Positive for malaise/fatigue. Negative for chills, diaphoresis, fever and weight loss.  HENT: Negative for nosebleeds and sore throat.   Eyes: Negative for double vision.  Respiratory: Negative for cough, hemoptysis, sputum production, shortness of breath and wheezing.    Cardiovascular: Positive for leg swelling. Negative for chest pain, palpitations and orthopnea.  Gastrointestinal: Negative for abdominal pain, blood in stool, constipation, diarrhea, heartburn, melena, nausea and vomiting.  Genitourinary: Negative for dysuria, frequency and urgency.  Musculoskeletal: Negative for back pain and joint pain.  Skin: Negative.  Negative for itching and rash.  Neurological: Negative for dizziness, tingling, focal weakness, weakness and headaches.  Endo/Heme/Allergies: Does not bruise/bleed easily.  Psychiatric/Behavioral: Negative for depression. The patient is not nervous/anxious and does not have insomnia.      PAST MEDICAL HISTORY :  Past Medical History:  Diagnosis Date  . Adult celiac disease   . Anemia associated with chronic renal failure 2017   blood transfusion last week 10/17  . Arthritis   . Asthma   . Cancer ([place]) 2017   bladder  . Chronic kidney disease   . 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  dr. Candiss Norse and dr. Holley Raring follow her  . COPD (chronic obstructive pulmonary disease) ([place])   . Coronary artery disease   . Diabetes mellitus without complication ([place])   . Dialysis patient ([facility])    Tues, Thurs, Sat  . Dyspnea    with exertion  . Elevated lipids   . GERD (gastroesophageal reflux disease)   . Hematuria   . Hypertension   . Lower back pain   . Neuropathy   . Oxygen dependent    at hs  . Personal history of chemotherapy 2017   bladder ca  . Personal history of radiation therapy 2017   bladder ca  . Sleep apnea  uses cpap  . Urinary obstruction 01/2016    PAST SURGICAL HISTORY :   Past Surgical History:  Procedure Laterality Date  . A/V SHUNT INTERVENTION N/A 05/12/2018   Procedure: A/V SHUNT INTERVENTION;  Surgeon: Algernon Huxley, MD;  Location: [facility];  Service: Cardiovascular;  Laterality: N/A;  . ABDOMINAL HYSTERECTOMY    . AV FISTULA PLACEMENT Left 02/17/2017   Procedure: INSERTION  OF ARTERIOVENOUS (AV) GORE-TEX GRAFT ARM(BRACHIAL AXILLARY);  Surgeon: Katha Cabal, MD;  Location: [facility];  Service: Vascular;  Laterality: Left;  . CYSTOSCOPY W/ RETROGRADES Bilateral 02/17/2016   Procedure: CYSTOSCOPY WITH RETROGRADE PYELOGRAM;  Surgeon: Hollice Espy, MD;  Location: [facility];  Service: Urology;  Laterality: Bilateral;  . CYSTOSCOPY W/ RETROGRADES Bilateral 10/12/2016   Procedure: CYSTOSCOPY WITH RETROGRADE PYELOGRAM;  Surgeon: Hollice Espy, MD;  Location: [facility];  Service: Urology;  Laterality: Bilateral;  . CYSTOSCOPY W/ RETROGRADES Bilateral 06/09/2017   Procedure: CYSTOSCOPY WITH RETROGRADE PYELOGRAM;  Surgeon: Hollice Espy, MD;  Location: [facility];  Service: Urology;  Laterality: Bilateral;  . CYSTOSCOPY W/ RETROGRADES Bilateral 10/07/2017   Procedure: CYSTOSCOPY WITH RETROGRADE PYELOGRAM;  Surgeon: Hollice Espy, MD;  Location: [facility];  Service: Urology;  Laterality: Bilateral;  . CYSTOSCOPY W/ URETERAL STENT PLACEMENT Left 05/12/2016   Procedure: CYSTOSCOPY WITH STENT REPLACEMENT;  Surgeon: Hollice Espy, MD;  Location: [facility];  Service: Urology;  Laterality: Left;  . CYSTOSCOPY W/ URETERAL STENT PLACEMENT Left 10/12/2016   Procedure: CYSTOSCOPY WITH STENT REPLACEMENT;  Surgeon: Hollice Espy, MD;  Location: [facility];  Service: Urology;  Laterality: Left;  . CYSTOSCOPY WITH BIOPSY N/A 06/09/2017   Procedure: CYSTOSCOPY WITH BLADDER BIOPSY;  Surgeon: Hollice Espy, MD;  Location: [facility];  Service: Urology;  Laterality: N/A;  . CYSTOSCOPY WITH STENT PLACEMENT Left 01/21/2016   Procedure: CYSTOSCOPY WITH double J STENT PLACEMENT;  Surgeon: Franchot Gallo, MD;  Location: [facility];  Service: Urology;  Laterality: Left;  . CYSTOSCOPY WITH STENT PLACEMENT Right 10/12/2016   Procedure: CYSTOSCOPY WITH STENT PLACEMENT;  Surgeon: Hollice Espy, MD;  Location: [facility];  Service: Urology;  Laterality: Right;  . DIALYSIS/PERMA CATHETER INSERTION N/A 11/20/2016    Procedure: Dialysis/Perma Catheter Insertion;  Surgeon: Algernon Huxley, MD;  Location: [facility];  Service: Cardiovascular;  Laterality: N/A;  . DIALYSIS/PERMA CATHETER REMOVAL N/A 03/30/2017   Procedure: DIALYSIS/PERMA CATHETER REMOVAL;  Surgeon: Katha Cabal, MD;  Location: [facility];  Service: Cardiovascular;  Laterality: N/A;  . KIDNEY SURGERY  01/21/2016   IR NEPHROSTOMY PLACEMENT LEFT   . PERIPHERAL VASCULAR CATHETERIZATION N/A 04/07/2016   Procedure: Glori Luis Cath Insertion;  Surgeon: Katha Cabal, MD;  Location: [facility];  Service: Cardiovascular;  Laterality: N/A;  . PERIPHERAL VASCULAR THROMBECTOMY Left 06/02/2017   Procedure: PERIPHERAL VASCULAR THROMBECTOMY;  Surgeon: Algernon Huxley, MD;  Location: [facility];  Service: Cardiovascular;  Laterality: Left;  . PERIPHERAL VASCULAR THROMBECTOMY Left 01/28/2018   Procedure: PERIPHERAL VASCULAR THROMBECTOMY;  Surgeon: Katha Cabal, MD;  Location: [facility];  Service: Cardiovascular;  Laterality: Left;  . PERIPHERAL VASCULAR THROMBECTOMY Left 03/14/2018   Procedure: PERIPHERAL VASCULAR THROMBECTOMY;  Surgeon: Algernon Huxley, MD;  Location: [facility];  Service: Cardiovascular;  Laterality: Left;  . PERIPHERAL VASCULAR THROMBECTOMY Left 03/16/2018   Procedure: PERIPHERAL VASCULAR THROMBECTOMY;  Surgeon: Katha Cabal, MD;  Location: [facility];  Service: Cardiovascular;  Laterality: Left;  . PORTACATH PLACEMENT Right   .  ROTATOR CUFF REPAIR Left   . TEMPORARY DIALYSIS CATHETER N/A 05/11/2018   Procedure: TEMPORARY DIALYSIS CATHETER;  Surgeon: Katha Cabal, MD;  Location: [facility];  Service: Cardiovascular;  Laterality: N/A;  . TRANSURETHRAL RESECTION OF BLADDER TUMOR N/A 02/17/2016   Procedure: TRANSURETHRAL RESECTION OF BLADDER TUMOR (TURBT)-LARGE;  Surgeon: Hollice Espy, MD;  Location: [facility];  Service: Urology;  Laterality: N/A;  .  TRANSURETHRAL RESECTION OF BLADDER TUMOR N/A 10/07/2017   Procedure: TRANSURETHRAL RESECTION OF BLADDER TUMOR (TURBT)-small;  Surgeon: Hollice Espy, MD;  Location: [facility];  Service: Urology;  Laterality: N/A;  . URETEROSCOPY Left 02/17/2016   Procedure: URETEROSCOPY;  Surgeon: Hollice Espy, MD;  Location: [facility];  Service: Urology;  Laterality: Left;  . URETEROSCOPY Right 10/12/2016   Procedure: URETEROSCOPY;  Surgeon: Hollice Espy, MD;  Location: [facility];  Service: Urology;  Laterality: Right;    FAMILY HISTORY :   Family History  Problem Relation Age of Onset  . Diabetes Mother   . Cancer Father   . Breast cancer Neg Hx   . Kidney disease Neg Hx     SOCIAL HISTORY:   Social History   Tobacco Use  . Smoking status: Former Smoker    Types: Cigarettes    Last attempt to quit: 07/20/2002    Years since quitting: 16.0  . Smokeless tobacco: Never Used  . Tobacco comment: 01/22/2016   "  quit smoking many years ago "  Substance Use Topics  . Alcohol use: No  . Drug use: No    ALLERGIES:  is allergic to glipizide er and percocet [oxycodone-acetaminophen].  MEDICATIONS:  No current facility-administered medications for this visit.    No current outpatient medications on file.   Facility-Administered Medications Ordered in Other Visits  Medication Dose Route Frequency Provider Last Rate Last Dose  . ceFAZolin (ANCEF) IVPB 1 g/50 mL premix  1 g Intravenous Once Kris Hartmann, NP        PHYSICAL EXAMINATION: ECOG PERFORMANCE STATUS: 2 - Symptomatic, <50% confined to bed  BP (!) 175/80 (BP Location: Left Arm, Patient Position: Sitting, Cuff Size: Normal)   Pulse 72   Resp 16   Wt 191 lb (86.6 kg)   BMI 29.04 kg/m   Filed Weights   07/27/18 1335  Weight: 191 lb (86.6 kg)    Physical Exam  Constitutional: She is oriented to person, place, and time and well-developed, well-nourished, and in no distress.  Obese.  Walks with a cane.  Accompanied by husband.   HENT:  Head: Normocephalic and atraumatic.  Mouth/Throat: Oropharynx is clear and moist. No oropharyngeal exudate.  Eyes: Pupils are equal, round, and reactive to light.  Neck: Normal range of motion. Neck supple.  Cardiovascular: Normal rate and regular rhythm.  Pulmonary/Chest: No respiratory distress. She has no wheezes.  Decreased breath sounds bilaterally bases.  Abdominal: Soft. Bowel sounds are normal. She exhibits no distension and no mass. There is no abdominal tenderness. There is no rebound and no guarding.  Musculoskeletal: Normal range of motion.        General: Edema (Mild leg swelling.) present. No tenderness.  Neurological: She is alert and oriented to person, place, and time.  Skin: Skin is warm.  Psychiatric: Affect normal.    LABORATORY DATA:  I have reviewed the data as listed    Component Value Date/Time   NA 145 07/27/2018 1312   NA 138 11/04/2016 1453   NA 138 08/07/2012 0433   K 4.5  07/27/2018 1312   K 3.2 (L) 10/30/2014 1410   CL 111 07/27/2018 1312   CL 104 08/07/2012 0433   CO2 22 07/27/2018 1312   CO2 25 08/07/2012 0433   GLUCOSE 88 07/27/2018 1312   GLUCOSE 172 (H) 08/07/2012 0433   BUN 63 (H) 07/27/2018 1312   BUN 69 (H) 11/04/2016 1453   BUN 63 (H) 08/07/2012 0433   CREATININE 13.05 (H) 07/27/2018 1312   CREATININE 2.48 (H) 08/07/2012 0433   CALCIUM 8.1 (L) 07/27/2018 1312   CALCIUM 9.1 08/07/2012 0433   PROT 8.1 07/27/2018 1312   PROT 8.9 (H) 08/02/2012 0958   ALBUMIN 4.0 07/27/2018 1312   ALBUMIN 3.9 08/02/2012 0958   AST 15 07/27/2018 1312   AST 21 08/02/2012 0958   ALT 8 07/27/2018 1312   ALT 19 08/02/2012 0958   ALKPHOS 133 (H) 07/27/2018 1312   ALKPHOS 107 08/02/2012 0958   BILITOT 0.6 07/27/2018 1312   BILITOT 0.3 08/02/2012 0958   GFRNONAA 2 (L) 07/27/2018 1312   GFRNONAA 19 (L) 08/07/2012 0433   GFRAA 3 (L) 07/27/2018 1312   GFRAA 22 (L) 08/07/2012 0433    No results found for: SPEP, UPEP  Lab Results  Component  Value Date   WBC 5.6 07/27/2018   NEUTROABS 3.7 07/27/2018   HGB 11.5 (L) 07/27/2018   HCT 38.0 07/27/2018   MCV 98.4 07/27/2018   PLT 176 07/27/2018      Chemistry      Component Value Date/Time   NA 145 07/27/2018 1312   NA 138 11/04/2016 1453   NA 138 08/07/2012 0433   K 4.5 07/27/2018 1312   K 3.2 (L) 10/30/2014 1410   CL 111 07/27/2018 1312   CL 104 08/07/2012 0433   CO2 22 07/27/2018 1312   CO2 25 08/07/2012 0433   BUN 63 (H) 07/27/2018 1312   BUN 69 (H) 11/04/2016 1453   BUN 63 (H) 08/07/2012 0433   CREATININE 13.05 (H) 07/27/2018 1312   CREATININE 2.48 (H) 08/07/2012 0433      Component Value Date/Time   CALCIUM 8.1 (L) 07/27/2018 1312   CALCIUM 9.1 08/07/2012 0433   ALKPHOS 133 (H) 07/27/2018 1312   ALKPHOS 107 08/02/2012 0958   AST 15 07/27/2018 1312   AST 21 08/02/2012 0958   ALT 8 07/27/2018 1312   ALT 19 08/02/2012 0958   BILITOT 0.6 07/27/2018 1312   BILITOT 0.3 08/02/2012 0958       RADIOGRAPHIC STUDIES: I have personally reviewed the radiological images as listed and agreed with the findings in the report. No results found.   ASSESSMENT & PLAN:  Cancer of trigone of urinary bladder ([place]) # muscle invasive bladder cancer/ pT2/stage II-status post definitive radiation [not cystectomy candidate].  Stable; NED.   # PET jan 6th 2020-  was negative for metastatic disease. Dec 2019- cysto-NEG. review of NCCN guidelines-recommends imaging on annual basis starting year-3; recommend imaging again in December/January.   #anemia/hemoglobin 10.4; continue p.o. Iron; stable  # End-stage renal disease on dialysis [Dr.Singh]; elevated creatinine-13 is unable to get dialysis because of vascular issues.  Awaiting vascular evaluation this afternoon.  # port flush every 2 months.   # DISPOSITION:  # port flush every 2 months # follow up in 6 month--MD;/port flush/labscbc/cmp-Dr.B    No orders of the defined types were placed in this encounter.  All  questions were answered. The patient knows to call the clinic with any problems, questions or concerns.  Cammie Sickle, MD 07/27/2018 2:13 PM

## 2018-07-27 NOTE — Consult Note (Signed)
[facility] Admission History & Physical  MRN : [id number]  Barbara [name] is a 78 y.o. (09/22/1940) female who presents with chief complaint of my access is clotted again.  History of Present Illness: I am asked to evaluate the patient by Dr. Brett Albino.  The patient was sent here because they were unable to cannulate the left arm graft this morning. Furthermore the Center states there is no thrill or bruit. The patient states this is the first dialysis run to be missed. This problem is acute in onset and has been present for approximately 2 days. The patient is unaware of any other change.  Patient denies pain or tenderness overlying the access.  There is no pain with dialysis.  The patient denies hand pain or finger pain consistent with steal syndrome.   There have multiple any past interventions and declots of this access.  The patient is chronically hypotensive on dialysis.  Current Facility-Administered Medications  Medication Dose Route Frequency Provider Last Rate Last Dose  . 0.9 %  sodium chloride infusion   Intravenous Continuous Kris Hartmann, NP 10 mL/hr at 07/27/18 1458 1,000 mL at 07/27/18 1458  . ceFAZolin (ANCEF) 1-4 GM/50ML-% IVPB           . ceFAZolin (ANCEF) IVPB 1 g/50 mL premix  1 g Intravenous Once Eulogio Ditch E, NP      . fentaNYL (SUBLIMAZE) injection 12.5 mcg  12.5 mcg Intravenous Once PRN Kris Hartmann, NP      . ondansetron [facility]) injection 4 mg  4 mg Intravenous Q6H PRN Kris Hartmann, NP        Past Medical History:  Diagnosis Date  . Adult celiac disease   . Anemia associated with chronic renal failure 2017   blood transfusion last week 10/17  . Arthritis   . Asthma   . Cancer ([place]) 2017   bladder  . Chronic kidney disease   . 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  dr. Candiss Norse and dr. Holley Raring follow her  . COPD (chronic obstructive pulmonary disease) ([place])   . Coronary artery disease   . Diabetes mellitus  without complication ([place])   . Dialysis patient ([place])    Tues, Thurs, Sat  . Dyspnea    with exertion  . Elevated lipids   . GERD (gastroesophageal reflux disease)   . Hematuria   . Hypertension   . Lower back pain   . Neuropathy   . Oxygen dependent    at hs  . Personal history of chemotherapy 2017   bladder ca  . Personal history of radiation therapy 2017   bladder ca  . Sleep apnea    uses cpap  . Urinary obstruction 01/2016    Past Surgical History:  Procedure Laterality Date  . A/V SHUNT INTERVENTION N/A 05/12/2018   Procedure: A/V SHUNT INTERVENTION;  Surgeon: Algernon Huxley, MD;  Location: [facility];  Service: Cardiovascular;  Laterality: N/A;  . ABDOMINAL HYSTERECTOMY    . AV FISTULA PLACEMENT Left 02/17/2017   Procedure: INSERTION OF ARTERIOVENOUS (AV) GORE-TEX GRAFT ARM(BRACHIAL AXILLARY);  Surgeon: Katha Cabal, MD;  Location: [facility];  Service: Vascular;  Laterality: Left;  . CYSTOSCOPY W/ RETROGRADES Bilateral 02/17/2016   Procedure: CYSTOSCOPY WITH RETROGRADE PYELOGRAM;  Surgeon: Hollice Espy, MD;  Location: [facility];  Service: Urology;  Laterality: Bilateral;  . CYSTOSCOPY W/ RETROGRADES Bilateral 10/12/2016   Procedure: CYSTOSCOPY WITH RETROGRADE PYELOGRAM;  Surgeon:  Hollice Espy, MD;  Location: [facility];  Service: Urology;  Laterality: Bilateral;  . CYSTOSCOPY W/ RETROGRADES Bilateral 06/09/2017   Procedure: CYSTOSCOPY WITH RETROGRADE PYELOGRAM;  Surgeon: Hollice Espy, MD;  Location: [facility];  Service: Urology;  Laterality: Bilateral;  . CYSTOSCOPY W/ RETROGRADES Bilateral 10/07/2017   Procedure: CYSTOSCOPY WITH RETROGRADE PYELOGRAM;  Surgeon: Hollice Espy, MD;  Location: [facility];  Service: Urology;  Laterality: Bilateral;  . CYSTOSCOPY W/ URETERAL STENT PLACEMENT Left 05/12/2016   Procedure: CYSTOSCOPY WITH STENT REPLACEMENT;  Surgeon: Hollice Espy, MD;  Location: [facility];  Service: Urology;  Laterality: Left;  . CYSTOSCOPY W/ URETERAL  STENT PLACEMENT Left 10/12/2016   Procedure: CYSTOSCOPY WITH STENT REPLACEMENT;  Surgeon: Hollice Espy, MD;  Location: [facility];  Service: Urology;  Laterality: Left;  . CYSTOSCOPY WITH BIOPSY N/A 06/09/2017   Procedure: CYSTOSCOPY WITH BLADDER BIOPSY;  Surgeon: Hollice Espy, MD;  Location: [facility];  Service: Urology;  Laterality: N/A;  . CYSTOSCOPY WITH STENT PLACEMENT Left 01/21/2016   Procedure: CYSTOSCOPY WITH double J STENT PLACEMENT;  Surgeon: Franchot Gallo, MD;  Location: [facility];  Service: Urology;  Laterality: Left;  . CYSTOSCOPY WITH STENT PLACEMENT Right 10/12/2016   Procedure: CYSTOSCOPY WITH STENT PLACEMENT;  Surgeon: Hollice Espy, MD;  Location: [facility];  Service: Urology;  Laterality: Right;  . DIALYSIS/PERMA CATHETER INSERTION N/A 11/20/2016   Procedure: Dialysis/Perma Catheter Insertion;  Surgeon: Algernon Huxley, MD;  Location: [facility];  Service: Cardiovascular;  Laterality: N/A;  . DIALYSIS/PERMA CATHETER REMOVAL N/A 03/30/2017   Procedure: DIALYSIS/PERMA CATHETER REMOVAL;  Surgeon: Katha Cabal, MD;  Location: [place] [facility];  Service: Cardiovascular;  Laterality: N/A;  . KIDNEY SURGERY  01/21/2016   IR NEPHROSTOMY PLACEMENT LEFT   . PERIPHERAL VASCULAR CATHETERIZATION N/A 04/07/2016   Procedure: Glori Luis Cath Insertion;  Surgeon: Katha Cabal, MD;  Location: [facility];  Service: Cardiovascular;  Laterality: N/A;  . PERIPHERAL VASCULAR THROMBECTOMY Left 06/02/2017   Procedure: PERIPHERAL VASCULAR THROMBECTOMY;  Surgeon: Algernon Huxley, MD;  Location: [facility];  Service: Cardiovascular;  Laterality: Left;  . PERIPHERAL VASCULAR THROMBECTOMY Left 01/28/2018   Procedure: PERIPHERAL VASCULAR THROMBECTOMY;  Surgeon: Katha Cabal, MD;  Location: [facility];  Service: Cardiovascular;  Laterality: Left;  . PERIPHERAL VASCULAR THROMBECTOMY Left 03/14/2018   Procedure: PERIPHERAL VASCULAR THROMBECTOMY;  Surgeon: Algernon Huxley, MD;  Location: [facility];  Service: Cardiovascular;  Laterality: Left;  . PERIPHERAL VASCULAR THROMBECTOMY Left 03/16/2018   Procedure: PERIPHERAL VASCULAR THROMBECTOMY;  Surgeon: Katha Cabal, MD;  Location: [facility];  Service: Cardiovascular;  Laterality: Left;  . PORTACATH PLACEMENT Right   . ROTATOR CUFF REPAIR Left   . TEMPORARY DIALYSIS CATHETER N/A 05/11/2018   Procedure: TEMPORARY DIALYSIS CATHETER;  Surgeon: Katha Cabal, MD;  Location: [facility];  Service: Cardiovascular;  Laterality: N/A;  . TRANSURETHRAL RESECTION OF BLADDER TUMOR N/A 02/17/2016   Procedure: TRANSURETHRAL RESECTION OF BLADDER TUMOR (TURBT)-LARGE;  Surgeon: Hollice Espy, MD;  Location: [facility];  Service: Urology;  Laterality: N/A;  . TRANSURETHRAL RESECTION OF BLADDER TUMOR N/A 10/07/2017   Procedure: TRANSURETHRAL RESECTION OF BLADDER TUMOR (TURBT)-small;  Surgeon: Hollice Espy, MD;  Location: [facility];  Service: Urology;  Laterality: N/A;  . URETEROSCOPY Left 02/17/2016   Procedure: URETEROSCOPY;  Surgeon: Hollice Espy, MD;  Location: [facility];  Service: Urology;  Laterality: Left;  . URETEROSCOPY Right 10/12/2016   Procedure: URETEROSCOPY;  Surgeon: Hollice Espy, MD;  Location: [facility];  Service: Urology;  Laterality: Right;    Social History Social History   Tobacco Use  . Smoking status: Former Smoker    Types: Cigarettes    Last attempt to quit: 07/20/2002    Years since quitting: 16.0  . Smokeless tobacco: Never Used  . Tobacco comment: 01/22/2016   "  quit smoking many years ago "  Substance Use Topics  . Alcohol use: No  . Drug use: No    Family History Family History  Problem Relation Age of Onset  . Diabetes Mother   . Cancer Father   . Breast cancer Neg Hx   . Kidney disease Neg Hx     No family history of bleeding or clotting disorders, autoimmune disease or porphyria  Allergies  Allergen Reactions  . Glipizide Er Other (See  Comments)    Hypoglycemia   . Percocet [Oxycodone-Acetaminophen] Hives     REVIEW OF SYSTEMS (Negative unless checked)  Constitutional: [] Weight loss  [] Fever  [] Chills Cardiac: [] Chest pain   [] Chest pressure   [] Palpitations   [] Shortness of breath when laying flat   [] Shortness of breath at rest   [x] Shortness of breath with exertion. Vascular:  [] Pain in legs with walking   [] Pain in legs at rest   [] Pain in legs when laying flat   [] Claudication   [] Pain in feet when walking  [] Pain in feet at rest  [] Pain in feet when laying flat   [] History of DVT   [] Phlebitis   [] Swelling in legs   [] Varicose veins   [] Non-healing ulcers Pulmonary:   [] Uses home oxygen   [] Productive cough   [] Hemoptysis   [] Wheeze  [] COPD   [] Asthma Neurologic:  [] Dizziness  [] Blackouts   [] Seizures   [] History of stroke   [] History of TIA  [] Aphasia   [] Temporary blindness   [] Dysphagia   [] Weakness or numbness in arms   [] Weakness or numbness in legs Musculoskeletal:  [] Arthritis   [] Joint swelling   [] Joint pain   [] Low back pain Hematologic:  [] Easy bruising  [] Easy bleeding   [] Hypercoagulable state   [] Anemic  [] Hepatitis Gastrointestinal:  [] Blood in stool   [] Vomiting blood  [] Gastroesophageal reflux/heartburn   [] Difficulty swallowing. Genitourinary:  [x] Chronic kidney disease   [] Difficult urination  [] Frequent urination  [] Burning with urination   [] Blood in urine Skin:  [] Rashes   [] Ulcers   [] Wounds Psychological:  [] History of anxiety   []  History of major depression.  Physical Examination  Vitals:   07/27/18 1446  BP: (!) 168/90  Pulse: 69  Resp: 20  Temp: 98.2 F (36.8 C)  TempSrc: Oral  SpO2: 94%  Weight: 86.6 kg  Height: 5' 8"  (1.727 m)   Body mass index is 29.04 kg/m. Gen: WD/WN, NAD Head: [place]/AT, No temporalis wasting. Prominent temp pulse not noted. Ear/Nose/Throat: Hearing grossly intact, nares w/o erythema or drainage, oropharynx w/o Erythema/Exudate,  Eyes: Conjunctiva clear,  sclera non-icteric Neck: Trachea midline.  No JVD.  Pulmonary:  Good air movement, respirations not labored, no use of accessory muscles.  Cardiac: RRR, normal S1, S2. Vascular: Left arm AV graft no thrill no bruit Vessel Right Left  Radial Palpable Palpable  Ulnar Not Palpable Not Palpable  Brachial Palpable Palpable  Carotid Palpable, without bruit Palpable, without bruit  Gastrointestinal: soft, non-tender/non-distended. No guarding/reflex.  Musculoskeletal: M/S 5/5 throughout.  Extremities without ischemic changes.  No deformity or atrophy.  Neurologic: Sensation grossly intact in extremities.  Symmetrical.  Speech is fluent. Motor exam as listed above. Psychiatric: Judgment intact, Mood & affect appropriate for pt's clinical situation. Dermatologic: No rashes or ulcers noted.  No cellulitis or open wounds. Lymph : No Cervical, Axillary, or Inguinal lymphadenopathy.   CBC Lab Results  Component Value Date   WBC 5.6 07/27/2018   HGB 11.5 (L) 07/27/2018   HCT 38.0 07/27/2018   MCV 98.4 07/27/2018   PLT 176 07/27/2018    BMET    Component Value Date/Time   NA 145 07/27/2018 1312   NA 138 11/04/2016 1453   NA 138 08/07/2012 0433   K 4.5 07/27/2018 1312   K 3.2 (L) 10/30/2014 1410   CL 111 07/27/2018 1312   CL 104 08/07/2012 0433   CO2 22 07/27/2018 1312   CO2 25 08/07/2012 0433   GLUCOSE 88 07/27/2018 1312   GLUCOSE 172 (H) 08/07/2012 0433   BUN 63 (H) 07/27/2018 1312   BUN 69 (H) 11/04/2016 1453   BUN 63 (H) 08/07/2012 0433   CREATININE 13.05 (H) 07/27/2018 1312   CREATININE 2.48 (H) 08/07/2012 0433   CALCIUM 8.1 (L) 07/27/2018 1312   CALCIUM 9.1 08/07/2012 0433   GFRNONAA 2 (L) 07/27/2018 1312   GFRNONAA 19 (L) 08/07/2012 0433   GFRAA 3 (L) 07/27/2018 1312   GFRAA 22 (L) 08/07/2012 0433   Estimated Creatinine Clearance: 4.1 mL/min (A) (by C-G formula based on SCr of 13.05 mg/dL (H)).  COAG Lab Results  Component Value Date   INR 1.08 02/08/2017   INR 1.23  01/25/2016   INR 1.38 01/24/2016    Radiology Nm Pet Image Restag (ps) Skull Base To Thigh  Result Date: 07/25/2018 CLINICAL DATA:  Subsequent treatment strategy for bladder cancer. EXAM: NUCLEAR MEDICINE PET SKULL BASE TO THIGH TECHNIQUE: 10.12 mCi F-18 FDG was injected intravenously. Full-ring PET imaging was performed from the skull base to thigh after the radiotracer. CT data was obtained and used for attenuation correction and anatomic localization. Fasting blood glucose: 85 mg/dl COMPARISON:  CT cap 01/19/2018 FINDINGS: Mediastinal blood pool activity: SUV max 2.55 NECK: No hypermetabolic lymph nodes in the neck. Incidental CT findings: none CHEST: No hypermetabolic axillary, supraclavicular, mediastinal, or hilar lymph nodes. Incidental CT findings: Cardiac enlargement with aortic atherosclerosis. Three vessel coronary artery atherosclerotic calcifications are identified. ABDOMEN/PELVIS: No abnormal radiotracer activity identified within the liver, pancreas, spleen, or adrenal glands. No hypermetabolic abdominal lymph nodes. Right common iliac lymph node measures 1.1 cm within SUV max 3.11. On CT from 01/19/2018 this lymph node measured 1.3 cm. On PET-CT from 12/22/2016 this measured 1.6 cm and had an SUV max of 2.8. Incidental CT findings: Persistent high-grade left-sided hydronephrosis and hydroureter. Recurrent right-sided hydronephrosis and hydroureter is identified. Marked diffuse bladder wall thickening is again identified. The bladder wall thickness measures up to 3.3 cm, image 215/3. Similar to 01/19/2018. SKELETON: No focal hypermetabolic activity to suggest skeletal metastasis. Incidental CT findings: none IMPRESSION: 1. Mild nonspecific FDG uptake is associated with the borderline enlarged index right common iliac lymph node referenced on study from 01/19/2018. This appears decreased in size when compared with 01/19/2018 and 01/01/2017. 2. No additional enlarged or hypermetabolic lymph nodes  identified. No evidence for solid organ metastasis. 3. Marked bladder wall thickening. Persistent high-grade obstructive uropathy on the left. Recurrent right-sided hydronephrosis noted. 4.  Aortic Atherosclerosis (ICD10-I70.0). 5. Coronary artery atherosclerotic calcifications. Electronically Signed   By: Kerby Moors M.D.   On: 07/25/2018 12:06   Vas US Duplex Dialysis Access (avf, Avg)  Result Date: 06/30/2018 DIALYSIS ACCESS Access Site: Left Upper Extremity. Access Type: BrachAx AVG. History: 02/17/17: Left brachial-axillary AVG placement;          06/02/17: AVG thrombectomy with PTA of innominate vein and both          anastomoses; 05/12/2018 thrombectomy, PTA intervention of AVG          01/28/17: AVG thrombectomy with PTAs/stents of venous anastomosis and          the innominate/subclavian veins;          03/14/18: AVG thrombectomy with PTA of mid-distal AVG and both          anastomoses;          03/16/18: AVG thrombectomy with PTA of innominate vein and venous          outflow;. Comparison Study: 03/31/2018 Performing Technologist: Concha Norway RVT  Examination Guidelines: A complete evaluation includes B-mode imaging, spectral Doppler, color Doppler, and power Doppler as needed of all accessible portions of each vessel. Unilateral testing is considered an integral part of a complete examination. Limited examinations for reoccurring indications may be performed as noted.  Findings:  +---------------+----------+-------------+----------+--------+ OUTFLOW VEIN   PSV (cm/s)Diameter (cm)Depth (cm)Describe +---------------+----------+-------------+----------+--------+ Subclavian vein   107                                    +---------------+----------+-------------+----------+--------+ Axillary vein      78                                    +---------------+----------+-------------+----------+--------+ AC Fossa           81                                     +---------------+----------+-------------+----------+--------+  +--------------------+----------+-----------------+--------+ AVG                 PSV (cm/s)Flow Vol (mL/min)Describe +--------------------+----------+-----------------+--------+ Native artery inflow   134          1254                +--------------------+----------+-----------------+--------+ Arterial anastomosis   134                              +--------------------+----------+-----------------+--------+ Prox graft             217                              +--------------------+----------+-----------------+--------+ Mid graft              317                              +--------------------+----------+-----------------+--------+ Distal graft           261                              +--------------------+----------+-----------------+--------+ Venous anastomosis     107                              +--------------------+----------+-----------------+--------+  Venous outflow         103                              +--------------------+----------+-----------------+--------+ +----------------+-------------+----------+---------+----------+---------------+                 Diameter (cm)Depth (cm)BranchingPSV (cm/s)  Flow Volume                                                                (ml/min)     +----------------+-------------+----------+---------+----------+---------------+ Left Rad art                                        20                    distal                                                                    +----------------+-------------+----------+---------+----------+---------------+ Antegrade                                                                 +----------------+-------------+----------+---------+----------+---------------+  Summary: Patent AVG with improved velocities at the proximal graft s/p PTA and thrombectomy. Flow volume has increased  compared to previous study. Mid graft shows mild stenosis and increased velocities due to irregular wall and moderate thrombus from mid to distal AVG and proximal outflow vein.  *See table(s) above for measurements and observations.  Diagnosing physician: [name] MD Electronically signed by [name] MD on 06/30/2018 at 5:13:26 PM.   --------------------------------------------------------------------------------   Final     Assessment/Plan 1.  Complication dialysis device with thrombosis AV access:  Patient's left arm dialysis access is thrombosed. The patient will undergo thrombectomy using interventional techniques.  However, at this time her serum potassium is too high and proceeding directly with thrombectomy would put her at risk for malignant cardiac arrhythmias and possibly death.  Therefore she will undergo placement of a temporary catheter at the bedside today she will get caught up on her dialysis and her electrolytes will be returned to more appropriate levels.  Following this we will move forward with thrombectomy of her graft.  The risks and benefits were described to the patient.  All questions were answered.  The patient agrees to proceed with placement of a temporary catheter first and then angiography and intervention.  2.  End-stage renal disease requiring hemodialysis:  Patient will continue dialysis therapy without further interruption if a successful thrombectomy is not achieved then catheter will be placed. Dialysis has already been arranged since the patient missed their previous session 3.  Hypertension:  Patient will continue medical management; nephrology is following no changes  in oral medications. 4.  COPD:  Continue pulmonary medications and aerosols as already ordered, these medications have been reviewed and there are no changes at this time. 5.  Coronary artery disease:  EKG will be monitored. Nitrates will be used if needed. The patient's oral cardiac  medications will be continued.    [name], MD  07/27/2018 4:37 PM

## 2018-07-27 NOTE — Op Note (Signed)
  OPERATIVE NOTE   PROCEDURE: 1. Insertion of temporary dialysis catheter catheter right femoral approach.  PRE-OPERATIVE DIAGNOSIS: Complication dialysis access with thrombosis; end-stage renal disease requiring hemodialysis; hyperkalemia  POST-OPERATIVE DIAGNOSIS: Same  SURGEON: Katha Cabal M.D.  ANESTHESIA: 1% lidocaine local infiltration  ESTIMATED BLOOD LOSS: Minimal cc  INDICATIONS:   Barbara [name] is a 78 y.o. female who presents with thrombosis of her left arm AV access.  Her serum potassium is 5.5 which is too high to perform thrombectomy of her access and therefore temporary catheter is being placed.  Risks and benefits are reviewed all questions were answered patient agrees to proceed with temporary catheter placement and subsequently thrombectomy of her AV access.  DESCRIPTION: After obtaining full informed written consent, the patient was positioned supine. The right groin was prepped and draped in a sterile fashion. Ultrasound was placed in a sterile sleeve. Ultrasound was utilized to identify the right common femoral vein which is noted to be echolucent and compressible indicating patency. Images recorded for the permanent record. Under real-time visualization a Seldinger needle is inserted into the vein and the guidewires advanced without difficulty. Small counterincision was made at the wire insertion site. Dilator is passed over the wire and the temporary dialysis catheter catheter is fed over the wire without difficulty.  All lumens aspirate and flush easily and are packed with heparin saline. Catheter secured to the skin of the right thigh with 2-0 silk. A sterile dressing is applied with Biopatch.  COMPLICATIONS: None  CONDITION: Unchanged  Barbara [name] Office:  [phone] 07/27/2018, 4:51 PM

## 2018-07-27 NOTE — H&P (Addendum)
[facility] NAME: Barbara [name]    MR#:  [id number]  DATE OF BIRTH:  1940/08/22  DATE OF ADMISSION:  07/27/2018  PRIMARY CARE PHYSICIAN: [name], MD   REQUESTING/REFERRING PHYSICIAN: Dr. Delana Meyer  CHIEF COMPLAINT:  Fistula is not working.   HISTORY OF PRESENT ILLNESS:  Barbara [name]  is a 78 y.o. female with a known history of ESRD on HD TTS, bladder cancer, COPD, CAD, type 2 diabetes, hypertension, OSA who was undergoing fistula declotting by vascular surgery today, when she was noted to have a potassium of 5.5.  Her procedure was canceled.  Temporary dialysis catheter was placed, and patient was admitted for dialysis.  Vascular will plan for fistula declotting tomorrow.  Patient states that she feels fine.  She has been unable to get dialysis for the last 5 days due to fistula issues.  She denies any weakness or palpitations.  Hospitalists were called for admission.  PAST MEDICAL HISTORY:   Past Medical History:  Diagnosis Date  . Adult celiac disease   . Anemia associated with chronic renal failure 2017   blood transfusion last week 10/17  . Arthritis   . Asthma   . Cancer ([place]) 2017   bladder  . Chronic kidney disease   . 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  dr. Candiss Norse and dr. Holley Raring follow her  . COPD (chronic obstructive pulmonary disease) ([place])   . Coronary artery disease   . Diabetes mellitus without complication ([place])   . Dialysis patient ([place])    Tues, Thurs, Sat  . Dyspnea    with exertion  . Elevated lipids   . GERD (gastroesophageal reflux disease)   . Hematuria   . Hypertension   . Lower back pain   . Neuropathy   . Oxygen dependent    at hs  . Personal history of chemotherapy 2017   bladder ca  . Personal history of radiation therapy 2017   bladder ca  . Sleep apnea    uses cpap  . Urinary obstruction 01/2016    PAST SURGICAL HISTORY:   Past Surgical History:  Procedure  Laterality Date  . A/V SHUNT INTERVENTION N/A 05/12/2018   Procedure: A/V SHUNT INTERVENTION;  Surgeon: Algernon Huxley, MD;  Location: [facility];  Service: Cardiovascular;  Laterality: N/A;  . ABDOMINAL HYSTERECTOMY    . AV FISTULA PLACEMENT Left 02/17/2017   Procedure: INSERTION OF ARTERIOVENOUS (AV) GORE-TEX GRAFT ARM(BRACHIAL AXILLARY);  Surgeon: Katha Cabal, MD;  Location: [facility];  Service: Vascular;  Laterality: Left;  . CYSTOSCOPY W/ RETROGRADES Bilateral 02/17/2016   Procedure: CYSTOSCOPY WITH RETROGRADE PYELOGRAM;  Surgeon: Hollice Espy, MD;  Location: [facility];  Service: Urology;  Laterality: Bilateral;  . CYSTOSCOPY W/ RETROGRADES Bilateral 10/12/2016   Procedure: CYSTOSCOPY WITH RETROGRADE PYELOGRAM;  Surgeon: Hollice Espy, MD;  Location: [facility];  Service: Urology;  Laterality: Bilateral;  . CYSTOSCOPY W/ RETROGRADES Bilateral 06/09/2017   Procedure: CYSTOSCOPY WITH RETROGRADE PYELOGRAM;  Surgeon: Hollice Espy, MD;  Location: [facility];  Service: Urology;  Laterality: Bilateral;  . CYSTOSCOPY W/ RETROGRADES Bilateral 10/07/2017   Procedure: CYSTOSCOPY WITH RETROGRADE PYELOGRAM;  Surgeon: Hollice Espy, MD;  Location: [facility];  Service: Urology;  Laterality: Bilateral;  . CYSTOSCOPY W/ URETERAL STENT PLACEMENT Left 05/12/2016   Procedure: CYSTOSCOPY WITH STENT REPLACEMENT;  Surgeon: Hollice Espy, MD;  Location: [facility];  Service: Urology;  Laterality: Left;  . CYSTOSCOPY  W/ URETERAL STENT PLACEMENT Left 10/12/2016   Procedure: CYSTOSCOPY WITH STENT REPLACEMENT;  Surgeon: Hollice Espy, MD;  Location: [facility];  Service: Urology;  Laterality: Left;  . CYSTOSCOPY WITH BIOPSY N/A 06/09/2017   Procedure: CYSTOSCOPY WITH BLADDER BIOPSY;  Surgeon: Hollice Espy, MD;  Location: [facility];  Service: Urology;  Laterality: N/A;  . CYSTOSCOPY WITH STENT PLACEMENT Left 01/21/2016   Procedure: CYSTOSCOPY WITH double J STENT PLACEMENT;  Surgeon: Franchot Gallo, MD;   Location: [facility];  Service: Urology;  Laterality: Left;  . CYSTOSCOPY WITH STENT PLACEMENT Right 10/12/2016   Procedure: CYSTOSCOPY WITH STENT PLACEMENT;  Surgeon: Hollice Espy, MD;  Location: [facility];  Service: Urology;  Laterality: Right;  . DIALYSIS/PERMA CATHETER INSERTION N/A 11/20/2016   Procedure: Dialysis/Perma Catheter Insertion;  Surgeon: Algernon Huxley, MD;  Location: [facility];  Service: Cardiovascular;  Laterality: N/A;  . DIALYSIS/PERMA CATHETER REMOVAL N/A 03/30/2017   Procedure: DIALYSIS/PERMA CATHETER REMOVAL;  Surgeon: Katha Cabal, MD;  Location: [facility];  Service: Cardiovascular;  Laterality: N/A;  . KIDNEY SURGERY  01/21/2016   IR NEPHROSTOMY PLACEMENT LEFT   . PERIPHERAL VASCULAR CATHETERIZATION N/A 04/07/2016   Procedure: Glori Luis Cath Insertion;  Surgeon: Katha Cabal, MD;  Location: [facility];  Service: Cardiovascular;  Laterality: N/A;  . PERIPHERAL VASCULAR THROMBECTOMY Left 06/02/2017   Procedure: PERIPHERAL VASCULAR THROMBECTOMY;  Surgeon: Algernon Huxley, MD;  Location: [facility];  Service: Cardiovascular;  Laterality: Left;  . PERIPHERAL VASCULAR THROMBECTOMY Left 01/28/2018   Procedure: PERIPHERAL VASCULAR THROMBECTOMY;  Surgeon: Katha Cabal, MD;  Location: [facility];  Service: Cardiovascular;  Laterality: Left;  . PERIPHERAL VASCULAR THROMBECTOMY Left 03/14/2018   Procedure: PERIPHERAL VASCULAR THROMBECTOMY;  Surgeon: Algernon Huxley, MD;  Location: [facility];  Service: Cardiovascular;  Laterality: Left;  . PERIPHERAL VASCULAR THROMBECTOMY Left 03/16/2018   Procedure: PERIPHERAL VASCULAR THROMBECTOMY;  Surgeon: Katha Cabal, MD;  Location: [facility];  Service: Cardiovascular;  Laterality: Left;  . PORTACATH PLACEMENT Right   . ROTATOR CUFF REPAIR Left   . TEMPORARY DIALYSIS CATHETER N/A 05/11/2018   Procedure: TEMPORARY DIALYSIS CATHETER;  Surgeon: Katha Cabal, MD;   Location: [facility];  Service: Cardiovascular;  Laterality: N/A;  . TRANSURETHRAL RESECTION OF BLADDER TUMOR N/A 02/17/2016   Procedure: TRANSURETHRAL RESECTION OF BLADDER TUMOR (TURBT)-LARGE;  Surgeon: Hollice Espy, MD;  Location: [facility];  Service: Urology;  Laterality: N/A;  . TRANSURETHRAL RESECTION OF BLADDER TUMOR N/A 10/07/2017   Procedure: TRANSURETHRAL RESECTION OF BLADDER TUMOR (TURBT)-small;  Surgeon: Hollice Espy, MD;  Location: [facility];  Service: Urology;  Laterality: N/A;  . URETEROSCOPY Left 02/17/2016   Procedure: URETEROSCOPY;  Surgeon: Hollice Espy, MD;  Location: [facility];  Service: Urology;  Laterality: Left;  . URETEROSCOPY Right 10/12/2016   Procedure: URETEROSCOPY;  Surgeon: Hollice Espy, MD;  Location: [facility];  Service: Urology;  Laterality: Right;    SOCIAL HISTORY:   Social History   Tobacco Use  . Smoking status: Former Smoker    Types: Cigarettes    Last attempt to quit: 07/20/2002    Years since quitting: 16.0  . Smokeless tobacco: Never Used  . Tobacco comment: 01/22/2016   "  quit smoking many years ago "  Substance Use Topics  . Alcohol use: No    FAMILY HISTORY:   Family History  Problem Relation Age of Onset  . Diabetes Mother   .  Cancer Father   . Breast cancer Neg Hx   . Kidney disease Neg Hx     DRUG ALLERGIES:   Allergies  Allergen Reactions  . Glipizide Er Other (See Comments)    Hypoglycemia   . Percocet [Oxycodone-Acetaminophen] Hives    REVIEW OF SYSTEMS:   Review of Systems  Constitutional: Negative for chills and fever.  HENT: Negative for congestion and sore throat.   Eyes: Negative for blurred vision and double vision.  Respiratory: Negative for cough and shortness of breath.   Cardiovascular: Negative for chest pain, palpitations and leg swelling.  Gastrointestinal: Negative for nausea and vomiting.  Genitourinary: Negative for dysuria and urgency.  Musculoskeletal: Negative for back pain and neck  pain.  Neurological: Negative for dizziness and headaches.  Psychiatric/Behavioral: Negative for depression. The patient is not nervous/anxious.     MEDICATIONS AT HOME:   Prior to Admission medications   Medication Sig Start Date End Date Taking? Authorizing Provider  albuterol (PROVENTIL HFA;VENTOLIN HFA) 108 (90 Base) MCG/ACT inhaler Inhale into the lungs. 05/16/18  Yes [provider]  atorvastatin (LIPITOR) 40 MG tablet Take 40 mg by mouth daily.  11/01/16  Yes [provider]  calcium acetate (PHOSLO) 667 MG capsule Take 2 capsules (1,334 mg total) by mouth 3 (three) times daily with meals. 11/23/16  Yes Fritzi Mandes, MD  cholecalciferol (VITAMIN D) 1000 units tablet Take 1,000 Units by mouth daily.   Yes [provider]  cinacalcet (SENSIPAR) 30 MG tablet Take 30 mg by mouth daily. With largest meal   Yes [provider]  esomeprazole (NEXIUM) 40 MG capsule Take 40 mg by mouth daily after breakfast.    Yes [provider]  ferrous sulfate 325 (65 FE) MG EC tablet Take 325 mg by mouth daily after breakfast.    Yes [provider]  furosemide (LASIX) 40 MG tablet Take 40 mg by mouth 2 (two) times daily. Take morning dose every day. Lunch dose as needed.   Yes [provider]  gabapentin (NEURONTIN) 300 MG capsule Take 600 mg by mouth 3 (three) times daily.   Yes [provider]  oxybutynin (DITROPAN) 5 MG tablet Take 5 mg by mouth 3 (three) times daily.   Yes [provider]  OXYGEN Inhale 2 L into the lungs at bedtime.   Yes [provider]  ACCU-CHEK AVIVA PLUS test strip 1 each by Other route daily. use as directed 01/26/17   [provider]  ACCU-CHEK SOFTCLIX LANCETS lancets INJECT 1 EACH AS DIRECTED ONCE DAILY. 05/28/17   [provider]  acetaminophen (TYLENOL) 500 MG tablet Take 1,000 mg by mouth 2 (two) times daily as needed for moderate pain or headache.     [provider]  Blood Glucose Monitoring Suppl (ACCU-CHEK AVIVA PLUS) w/Device KIT (USE TO MONITOR BLOOD SUGARS.) 12/30/17   [provider]  glucose blood test strip USE AS DIRECTED ONCE DAILY 05/23/18   [provider]  lidocaine (XYLOCAINE) 5 % ointment Apply 1 application topically 2 (two) times daily as needed for mild pain.  05/21/16   [provider]  lidocaine-prilocaine (EMLA) cream APPLY TO AFFECTED AREA ONCE 03/22/18   [provider]  mirabegron ER (MYRBETRIQ) 25 MG TB24 tablet Take by mouth. 05/27/18   [provider]  NON FORMULARY Place 1 Units into the nose at bedtime. CPAP Time of use 2100-0600    [provider]  traMADol (ULTRAM) 50 MG tablet Take 50 mg  by mouth every 12 (twelve) hours as needed.    [provider]  Travoprost, BAK Free, (TRAVATAN) 0.004 % SOLN ophthalmic solution Place 1 drop into both eyes at bedtime.    [provider]      VITAL SIGNS:  Blood pressure (!) 168/90, pulse 69, temperature 98.2 F (36.8 C), temperature source Oral, resp. rate 20, height 5' 8" (1.727 m), weight 86.6 kg, SpO2 94 %.  PHYSICAL EXAMINATION:  Physical Exam  GENERAL:  78 y.o.-year-old patient lying in the bed with no acute distress.  EYES: Pupils equal, round, reactive to light and accommodation. No scleral icterus. Extraocular muscles intact.  HEENT: Head atraumatic, normocephalic. Oropharynx and nasopharynx clear.  NECK:  Supple, no jugular venous distention. No thyroid enlargement, no tenderness.  LUNGS: Normal breath sounds bilaterally, no wheezing, rales,rhonchi or crepitation. No use of accessory muscles of respiration.  CARDIOVASCULAR: RRR, S1, S2 normal. No murmurs, rubs, or gallops.  ABDOMEN: Soft, nontender, nondistended. Bowel sounds present. No organomegaly or mass.  EXTREMITIES: No pedal edema, cyanosis, or clubbing.  NEUROLOGIC: Cranial nerves II through XII are intact. Muscle strength 5/5 in all extremities.  Sensation intact. Gait not checked.  PSYCHIATRIC: The patient is alert and oriented x 3.  SKIN: No obvious rash, lesion, or ulcer.   LABORATORY PANEL:   CBC Recent Labs  Lab 07/27/18 1312  WBC 5.6  HGB 11.5*  HCT 38.0  PLT 176   ------------------------------------------------------------------------------------------------------------------  Chemistries  Recent Labs  Lab 07/27/18 1312  NA 145  K 4.5  CL 111  CO2 22  GLUCOSE 88  BUN 63*  CREATININE 13.05*  CALCIUM 8.1*  AST 15  ALT 8  ALKPHOS 133*  BILITOT 0.6   ------------------------------------------------------------------------------------------------------------------  Cardiac Enzymes No results for input(s): TROPONINI in the last 168 hours. ------------------------------------------------------------------------------------------------------------------  RADIOLOGY:  No results found.    IMPRESSION AND PLAN:   Hyperkalemia- due to missing dialysis sessions because of nonfunctioning AV fistula. -Temporary dialysis catheter placed by vascular today -Plan for HD tonight -Vascular will do fistula declotting tomorrow  ESRD on HD TTS -Nephrology consulted -Continue home Lasix -Plan as above  COPD-stable, no signs of acute exacerbation -Continue home albuterol  CAD-stable, no active chest pain -Monitor  Type 2 diabetes- blood sugars well-controlled -SSI  Anemia of chronic kidney disease- hemoglobin at baseline -Continue home ferrous sulfate  OSA- stable -CPAP nightly  History of bladder cancer/radiation cystitis- follows with Dr. Erlene Quan as an outpatient. No urinary symptoms. -Continue home oxybutynin and myrbetriq  Hyperlipidemia- stable -Continue home lipitor  All the records are reviewed and case discussed with ED provider. Management plans discussed with the patient, family and they are in agreement.  CODE STATUS: Full  TOTAL TIME TAKING CARE OF THIS PATIENT: 45 minutes.     Berna Spare  M.D on 07/27/2018 at 4:41 PM  Between 7am to 6pm - Pager - [phone]  After 6pm go to [email or web address] - Proofreader  [facility]  Office  [phone]  CC: Primary care physician; [name], MD   Note: This dictation was prepared with Dragon dictation along with smaller phrase technology. Any transcriptional errors that result from this process are unintentional.

## 2018-07-27 NOTE — Progress Notes (Signed)
Pre HD Assessment    07/27/18 1930  Neurological  Level of Consciousness Alert  Orientation Level Oriented X4  Respiratory  Respiratory Pattern Regular;Unlabored;Symmetrical  Chest Assessment Chest expansion symmetrical  Bilateral Breath Sounds Clear  Cardiac  Pulse Regular  Heart Sounds S1, S2;No adventitious heart sounds  Jugular Venous Distention (JVD) No  ECG Monitor Yes  Cardiac Rhythm NSR;Heart block  Heart Block Type 1st degree AVB  Vascular  R Radial Pulse +2  L Radial Pulse +2  R Dorsalis Pedis Pulse +2  L Dorsalis Pedis Pulse +2  Integumentary  Integumentary (WDL) X  Skin Color Appropriate for ethnicity  Skin Condition Dry  Skin Integrity Catheter entry/exit site  Musculoskeletal  Musculoskeletal (WDL) WDL  GU Assessment  Genitourinary (WDL) X (HD patient)  Psychosocial  Psychosocial (WDL) WDL

## 2018-07-27 NOTE — Progress Notes (Signed)
HD Treatment Initiated    07/27/18 1932  Vital Signs  Pulse Rate 71  Pulse Rate Source Monitor  Resp 17  BP (!) 189/84  BP Location Right Arm  BP Method Automatic  Patient Position (if appropriate) Lying  Oxygen Therapy  SpO2 94 %  O2 Device Room Air  During Hemodialysis Assessment  Blood Flow Rate (mL/min) 350 mL/min  Arterial Pressure (mmHg) -250 mmHg  Venous Pressure (mmHg) 150 mmHg  Transmembrane Pressure (mmHg) 70 mmHg  Ultrafiltration Rate (mL/min) 330 mL/min  Dialysate Flow Rate (mL/min) 600 ml/min  Conductivity: Machine  13.8  HD Safety Checks Performed Yes  Dialysis Fluid Bolus Normal Saline  Bolus Amount (mL) 250 mL  Intra-Hemodialysis Comments Tx initiated  Note  Observations Dialysis Catheter in use; site bleeding  Fistula / Graft Left Upper arm Arteriovenous vein graft  Placement Date/Time: 02/17/17 1111   Orientation: Left  Access Location: Upper arm  Access Type: Arteriovenous vein graft  Fistula / Graft Assessment Absent ;Thrill;Bruit

## 2018-07-27 NOTE — Assessment & Plan Note (Addendum)
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  Stable; NED.   # PET jan 6th 2020-  was negative for metastatic disease. Dec 2019- cysto-NEG. review of NCCN guidelines-recommends imaging on annual basis starting year-3; recommend imaging again in December/January.   #anemia/hemoglobin 10.4; continue p.o. Iron; stable  # End-stage renal disease on dialysis [Dr.Singh]; elevated creatinine-13 is unable to get dialysis because of vascular issues.  Awaiting vascular evaluation this afternoon.  # port flush every 2 months.   # DISPOSITION:  # port flush every 2 months # follow up in 6 month--MD;/port flush/labscbc/cmp-Dr.B

## 2018-07-27 NOTE — Progress Notes (Signed)
HD Treatment Complete    07/27/18 2230  Vital Signs  Pulse Rate 67  Pulse Rate Source Monitor  Resp (!) 21  BP (!) 177/77  BP Location Right Arm  BP Method Automatic  Patient Position (if appropriate) Lying  Oxygen Therapy  SpO2 97 %  O2 Device Room Air  During Hemodialysis Assessment  Blood Flow Rate (mL/min) 300 mL/min  Arterial Pressure (mmHg) -260 mmHg  Venous Pressure (mmHg) 170 mmHg  Transmembrane Pressure (mmHg) 60 mmHg  Ultrafiltration Rate (mL/min) 400 mL/min  Dialysate Flow Rate (mL/min) 600 ml/min  Conductivity: Machine  13.8  HD Safety Checks Performed Yes  Intra-Hemodialysis Comments Progressing as prescribed;Tolerated well;Tx completed (UF 1060)

## 2018-07-27 NOTE — Progress Notes (Signed)
Pre HD Treatment    07/27/18 1915  Vital Signs  Temp 98 F (36.7 C)  Temp Source Oral  Pulse Rate 70  Pulse Rate Source Monitor  Resp 20  BP (!) 189/83  BP Location Right Arm  BP Method Automatic  Patient Position (if appropriate) Lying  Oxygen Therapy  SpO2 91 %  O2 Device Room Air  Pain Assessment  Pain Scale 0-10  Pain Score 0  Dialysis Weight  Weight 87.1 kg  Type of Weight Pre-Dialysis  Time-Out for Hemodialysis  What Procedure? HD  Pt Identifiers(min of two) First/Last Name;MRN/Account#;Pt's DOB(use if MRN/Acct# not available  Correct Site? Yes  Correct Side? Yes  Correct Procedure? Yes  Consents Verified? Yes  Rad Studies Available? N/A  Safety Precautions Reviewed? Yes  Engineer, civil (consulting) Number 3  Station Number 1  UF/Alarm Test Passed  Conductivity: Meter 14  Conductivity: Machine  13.9  pH 7.2  Reverse Osmosis Main  Normal Saline Lot Number [id number]  Dialyzer Lot Number [id number]  Disposable Set Lot Number [id number]  Machine Temperature 98.6 F (37 C)  Musician and Audible Yes  Blood Lines Intact and Secured Yes  Pre Treatment Patient Checks  Vascular access used during treatment Catheter  Hepatitis B Surface Antigen Results Negative  Date Hepatitis B Surface Antigen Drawn 05/13/18 (Drawn today)  Isolation Initiated Yes  Hepatitis B Surface Antibody  (<10)  Date Hepatitis B Surface Antibody Drawn 11/30/17  Hemodialysis Consent Verified Yes  Hemodialysis Standing Orders Initiated Yes  ECG (Telemetry) Monitor On Yes  Prime Ordered Normal Saline  Length of  DialysisTreatment -hour(s) 3 Hour(s)  Dialysis Treatment Comments Na 140  Dialyzer Elisio 17H NR  Dialysate 2K, 2.5 Ca  Variable Sodium Other (Comment)  Dialysis Anticoagulant None  Dialysate Flow Ordered 600  Blood Flow Rate Ordered 400 mL/min  Ultrafiltration Goal 0.5 Liters  Pre Treatment Labs Hepatitis B Surface Antigen;Phosphorus  Dialysis Blood Pressure Support Ordered  Normal Saline  Note  Observations New Trialysis Catheter  Fistula / Graft Left Upper arm Arteriovenous vein graft  Placement Date/Time: 02/17/17 1111   Orientation: Left  Access Location: Upper arm  Access Type: Arteriovenous vein graft  Fistula / Graft Assessment Absent ;Thrill;Bruit

## 2018-07-28 ENCOUNTER — Observation Stay: Payer: Medicare Other

## 2018-07-28 DIAGNOSIS — E875 Hyperkalemia: Secondary | ICD-10-CM | POA: Diagnosis not present

## 2018-07-28 LAB — BASIC METABOLIC PANEL
ANION GAP: 10 (ref 5–15)
BUN: 40 mg/dL — ABNORMAL HIGH (ref 8–23)
CO2: 25 mmol/L (ref 22–32)
Calcium: 8 mg/dL — ABNORMAL LOW (ref 8.9–10.3)
Chloride: 104 mmol/L (ref 98–111)
Creatinine, Ser: 9.23 mg/dL — ABNORMAL HIGH (ref 0.44–1.00)
GFR calc Af Amer: 4 mL/min — ABNORMAL LOW (ref >60)
GFR calc non Af Amer: 4 mL/min — ABNORMAL LOW (ref >60)
Glucose, Bld: 101 mg/dL — ABNORMAL HIGH (ref 70–99)
POTASSIUM: 3.9 mmol/L (ref 3.5–5.1)
Sodium: 139 mmol/L (ref 135–145)

## 2018-07-28 LAB — CBC
HCT: 37.3 % (ref 36.0–46.0)
Hemoglobin: 11.5 g/dL — ABNORMAL LOW (ref 12.0–15.0)
MCH: 30 pg (ref 26.0–34.0)
MCHC: 30.8 g/dL (ref 30.0–36.0)
MCV: 97.4 fL (ref 80.0–100.0)
NRBC: 0 % (ref 0.0–0.2)
Platelets: 163 10*3/uL (ref 150–400)
RBC: 3.83 MIL/uL — AB (ref 3.87–5.11)
RDW: 15.5 % (ref 11.5–15.5)
WBC: 5.2 10*3/uL (ref 4.0–10.5)

## 2018-07-28 LAB — GLUCOSE, CAPILLARY
Glucose-Capillary: 89 mg/dL (ref 70–99)
Glucose-Capillary: 85 mg/dL (ref 70–99)
Glucose-Capillary: 91 mg/dL (ref 70–99)
Glucose-Capillary: 125 mg/dL — ABNORMAL HIGH (ref 70–99)

## 2018-07-28 MED ORDER — GABAPENTIN 300 MG PO CAPS
300.0000 mg | ORAL_CAPSULE | ORAL | Status: DC
  Administered 2018-07-28: 300 mg via ORAL
  Filled 2018-07-28: qty 1

## 2018-07-28 NOTE — Progress Notes (Signed)
Pre HD assessment    07/28/18 1405  Vital Signs  Temp 99.6 F (37.6 C)  Temp Source Oral  Pulse Rate 72  Pulse Rate Source Monitor  BP (!) 148/80  BP Location Right Arm  BP Method Automatic  Patient Position (if appropriate) Lying  Oxygen Therapy  SpO2 93 %  O2 Device Room Air  Pain Assessment  Pain Scale 0-10  Pain Score 0  Dialysis Weight  Weight 80 kg  Type of Weight Pre-Dialysis  Time-Out for Hemodialysis  What Procedure? HD  Pt Identifiers(min of two) First/Last Name;MRN/Account#  Correct Site? Yes  Correct Side? Yes  Correct Procedure? Yes  Consents Verified? Yes  Rad Studies Available? N/A  Safety Precautions Reviewed? Yes  Engineer, civil (consulting) Number  ([id number])  Station Number 1  UF/Alarm Test Passed  Conductivity: Meter 13.8  Conductivity: Machine  13.7  pH 7.6  Reverse Osmosis main  Normal Saline Lot Number [id number]  Dialyzer Lot Number [id number]  Disposable Set Lot Number [id number]  Machine Temperature 98.6 F (37 C)  Musician and Audible Yes  Blood Lines Intact and Secured Yes  Pre Treatment Patient Checks  Vascular access used during treatment Catheter  Hepatitis B Surface Antigen Results Negative  Date Hepatitis B Surface Antigen Drawn 07/08/18  Hepatitis B Surface Antibody  (<10)  Date Hepatitis B Surface Antibody Drawn 11/30/16  Hemodialysis Consent Verified Yes  Hemodialysis Standing Orders Initiated Yes  ECG (Telemetry) Monitor On Yes  Prime Ordered Normal Saline  Length of  DialysisTreatment -hour(s) 3 Hour(s)  Dialyzer Elisio 17H NR  Dialysate 2K, 2.5 Ca  Dialysis Anticoagulant None  Dialysate Flow Ordered 600  Blood Flow Rate Ordered 300 mL/min  Ultrafiltration Goal 0 Liters  Dialysis Blood Pressure Support Ordered Normal Saline  Education / Care Plan  Dialysis Education Provided Yes  Documented Education in Care Plan Yes

## 2018-07-28 NOTE — Care Management Obs Status (Signed)
[place] NOTIFICATION   Patient Details  Name: Barbara [name] MRN: [id number] Date of Birth: 04/21/1941   Medicare Observation Status Notification Given:  Yes    Elza Rafter, RN 07/28/2018, 3:58 PM

## 2018-07-28 NOTE — Progress Notes (Signed)
Back from xray

## 2018-07-28 NOTE — Progress Notes (Signed)
HD tx start    07/28/18 1412  Vital Signs  Pulse Rate 73  Pulse Rate Source Monitor  Resp (!) 23  BP (!) 143/71  BP Location Right Arm  BP Method Automatic  Patient Position (if appropriate) Lying  Oxygen Therapy  SpO2 90 %  O2 Device Room Air  During Hemodialysis Assessment  Blood Flow Rate (mL/min) 300 mL/min  Arterial Pressure (mmHg) -150 mmHg  Venous Pressure (mmHg) 140 mmHg  Transmembrane Pressure (mmHg) 60 mmHg  Ultrafiltration Rate (mL/min) 170 mL/min  Dialysate Flow Rate (mL/min) 600 ml/min  Conductivity: Machine  13.8  HD Safety Checks Performed Yes  Dialysis Fluid Bolus Normal Saline  Bolus Amount (mL) 250 mL  Intra-Hemodialysis Comments Tx initiated

## 2018-07-28 NOTE — Progress Notes (Signed)
Post HD assessment. Pt tolerated tx well without c/o. Pt 's system clotted midway through tx, new setup was required, MD aware. No blood loss, pt was able to be rinsed back. Net UF 15, goal met.    07/28/18 1732  Vital Signs  Temp 98.6 F (37 C)  Temp Source Oral  Pulse Rate 72  Pulse Rate Source Monitor  Resp 19  BP (!) 173/77  BP Location Right Arm  BP Method Automatic  Patient Position (if appropriate) Lying  Oxygen Therapy  SpO2 93 %  O2 Device Room Air  Dialysis Weight  Weight 79.8 kg  Type of Weight Post-Dialysis  Post-Hemodialysis Assessment  Rinseback Volume (mL) 250 mL  KECN 48.7 V  Dialyzer Clearance Lightly streaked  Duration of HD Treatment -hour(s) 3 hour(s)  Hemodialysis Intake (mL) 1000 mL  UF Total -Machine (mL) 1015 mL  Net UF (mL) 15 mL  Tolerated HD Treatment Yes  Education / Care Plan  Dialysis Education Provided Yes  Documented Education in Care Plan Yes

## 2018-07-28 NOTE — Progress Notes (Signed)
HD tx paused, system clotted, new setup required, MD aware. Pt was able to have blood rinsed back, no blood loss.    07/28/18 1545  Vital Signs  Pulse Rate 70  Pulse Rate Source Monitor  Resp (!) 22  BP (!) 156/76  BP Location Right Arm  BP Method Automatic  Patient Position (if appropriate) Lying  Oxygen Therapy  SpO2 92 %  O2 Device Room Air  During Hemodialysis Assessment  Dialysis Fluid Bolus Normal Saline  Bolus Amount (mL) 250 mL  Intra-Hemodialysis Comments See progress note (system clotted, new set up required, MD aware)

## 2018-07-28 NOTE — Progress Notes (Signed)
Pt changed to 3K bath, MD aware. Potassium 3.9 07/28/18.    07/28/18 1412  Vital Signs  Pulse Rate 73  Pulse Rate Source Monitor  Resp (!) 23  BP (!) 143/71  BP Location Right Arm  BP Method Automatic  Patient Position (if appropriate) Lying  Oxygen Therapy  SpO2 90 %  O2 Device Room Air  Pre Treatment Patient Checks  Dialysate 3K, 2.5 Ca (potassium 3.9, MD aware)  During Hemodialysis Assessment  Blood Flow Rate (mL/min) 300 mL/min  Arterial Pressure (mmHg) -150 mmHg  Venous Pressure (mmHg) 140 mmHg  Transmembrane Pressure (mmHg) 60 mmHg  Ultrafiltration Rate (mL/min) 170 mL/min  Dialysate Flow Rate (mL/min) 600 ml/min  Conductivity: Machine  13.8  HD Safety Checks Performed Yes  Dialysis Fluid Bolus Normal Saline  Bolus Amount (mL) 250 mL  Intra-Hemodialysis Comments Tx initiated

## 2018-07-28 NOTE — Progress Notes (Signed)
HD tx resumed    07/28/18 1554  Vital Signs  Pulse Rate 72  Resp 15  Oxygen Therapy  SpO2 95 %  During Hemodialysis Assessment  Blood Flow Rate (mL/min) 300 mL/min  Arterial Pressure (mmHg) -180 mmHg  Venous Pressure (mmHg) 140 mmHg  Transmembrane Pressure (mmHg) 60 mmHg  Ultrafiltration Rate (mL/min) 500 mL/min  Dialysate Flow Rate (mL/min) 600 ml/min  Conductivity: Machine  13.8  HD Safety Checks Performed Yes  Dialysis Fluid Bolus Normal Saline  Bolus Amount (mL) 250 mL  Intra-Hemodialysis Comments See progress note (HD tx resumed )

## 2018-07-28 NOTE — Progress Notes (Signed)
To Dialysis via bed

## 2018-07-28 NOTE — Progress Notes (Signed)
Pre HD assessment    07/28/18 1406  Neurological  Level of Consciousness Alert  Orientation Level Oriented X4  Respiratory  Respiratory Pattern Regular;Unlabored  Chest Assessment Chest expansion symmetrical  Cardiac  Pulse Regular  ECG Monitor Yes  Cardiac Rhythm NSR  Vascular  R Radial Pulse +2  L Radial Pulse +2  Edema Generalized  Integumentary  Integumentary (WDL) X  Skin Color Appropriate for ethnicity  Musculoskeletal  Musculoskeletal (WDL) X  Generalized Weakness Yes  Assistive Device None  GU Assessment  Genitourinary (WDL) X  Genitourinary Symptoms  (HD)  Psychosocial  Psychosocial (WDL) WDL

## 2018-07-28 NOTE — Progress Notes (Addendum)
Barbara [name] at [place] NAME: Barbara [name]    MR#:  [id number]  DATE OF BIRTH:  1940/07/28  Hyper kalemia resolved, admitted for hyperkalemia, dialysis fistula dysfunction with clotting.  CHIEF COMPLAINT:  No chief complaint on file. , REVIEW OF SYSTEMS:   ROS CONSTITUTIONAL: No fever, fatigue or weakness.  EYES: No blurred or double vision.  EARS, NOSE, AND THROAT: No tinnitus or ear pain.  RESPIRATORY: No cough, shortness of breath, wheezing or hemoptysis.  CARDIOVASCULAR: No chest pain, orthopnea, edema.  GASTROINTESTINAL: No nausea, vomiting, diarrhea or abdominal pain.  GENITOURINARY: No dysuria, hematuria.  ENDOCRINE: No polyuria, nocturia,  HEMATOLOGY: No anemia, easy bruising or bleeding SKIN: No rash or lesion. MUSCULOSKELETAL: Left hip pain going to the left leg for a long time, pain is more on ambulation but not at rest.  NEUROLOGIC: No tingling, numbness, weakness.  PSYCHIATRY: No anxiety or depression.   DRUG ALLERGIES:   Allergies  Allergen Reactions  . Glipizide Er Other (See Comments)    Hypoglycemia   . Percocet [Oxycodone-Acetaminophen] Hives    VITALS:  Blood pressure 138/75, pulse 70, temperature 97.7 F (36.5 C), temperature source Oral, resp. rate 19, height 5' 8"  (1.727 m), weight 86.1 kg, SpO2 97 %.  PHYSICAL EXAMINATION:  GENERAL:  78 y.o.-year-old patient lying in the bed with no acute distress.  EYES: Pupils equal, round, reactive to light and accommodation. No scleral icterus. Extraocular muscles intact.  HEENT: Head atraumatic, normocephalic. Oropharynx and nasopharynx clear.  NECK:  Supple, no jugular venous distention. No thyroid enlargement, no tenderness.  LUNGS: Normal breath sounds bilaterally, no wheezing, rales,rhonchi or crepitation. No use of accessory muscles of respiration.  CARDIOVASCULAR: S1, S2 normal. No murmurs, rubs, or gallops.  ABDOMEN: Soft, nontender, nondistended. Bowel  sounds present. No organomegaly or mass.  EXTREMITIES: No pedal edema, cyanosis, or clubbing.  NEUROLOGIC: Cranial nerves II through XII are intact. Muscle strength 5/5 in all extremities. Sensation intact. Gait not checked.  PSYCHIATRIC: The patient is alert and oriented x 3.  SKIN: No obvious rash, lesion, or ulcer.    LABORATORY PANEL:   CBC Recent Labs  Lab 07/28/18 0803  WBC 5.2  HGB 11.5*  HCT 37.3  PLT 163   ------------------------------------------------------------------------------------------------------------------  Chemistries  Recent Labs  Lab 07/27/18 1312 07/28/18 0803  NA 145 139  K 4.5 3.9  CL 111 104  CO2 22 25  GLUCOSE 88 101*  BUN 63* 40*  CREATININE 13.05* 9.23*  CALCIUM 8.1* 8.0*  AST 15  --   ALT 8  --   ALKPHOS 133*  --   BILITOT 0.6  --    ------------------------------------------------------------------------------------------------------------------  Cardiac Enzymes No results for input(s): TROPONINI in the last 168 hours. ------------------------------------------------------------------------------------------------------------------  RADIOLOGY:  No results found.  EKG:   Orders placed or performed during the hospital encounter of 05/10/18  . ED EKG  . ED EKG  . EKG    ASSESSMENT AND PLAN:   #1 hyperkalemia due to missed dialysis sessions because of nonfunctioning AV fistula, patient had temporary dialysis catheter placed by vascular yesterday, had dialysis yesterday and also plan for dialysis today as per my discussion with nephrology.  Hyperkalemia resolved.  #2 ESRD on hemodialysis Tuesday, Thursday, Saturday, patient scheduled for declotting of AV fistula tomorrow.  Continue inpatient stay. #3 diabetes mellitus type 2: Continue present dose of insulin 4.  CAD: No chest pain.  Continue statins 5.OSa; Continue CPAP at night. #6. 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 #7, history of bladder cancer, followed by Dr. Rogue [name] and also Dr. Erlene [name], had cystectomy, radiation therapy, patient followed by them for annual imaging at this time.  All the records are reviewed and case discussed with Care Management/Social Workerr. Management plans discussed with the patient, family and they are in agreement.  CODE STATUS: Full code  TOTAL TIME TAKING CARE OF THIS PATIENT: 35 minutes.   POSSIBLE D/C IN 1-2DAYS, DEPENDING ON CLINICAL CONDITION. More than 50% time spent in counseling, coordination of care  Barbara [name] M.D on 07/28/2018 at 8:54 AM  Between 7am to 6pm - Pager - [phone]  After 6pm go to [email or web address] - password [email or web address] [facility]  Office  [phone]  CC: Primary care physician; Barbara Lemon, MD   Note: This dictation was prepared with Dragon dictation along with smaller phrase technology. Any transcriptional errors that result from this process are unintentional.

## 2018-07-28 NOTE — Progress Notes (Signed)
HD tx end   07/28/18 1724  Vital Signs  Pulse Rate 71  Pulse Rate Source Monitor  Resp (!) 21  BP (!) 171/81  BP Location Right Arm  BP Method Automatic  Patient Position (if appropriate) Lying  Oxygen Therapy  SpO2 93 %  O2 Device Room Air  During Hemodialysis Assessment  Dialysis Fluid Bolus Normal Saline  Bolus Amount (mL) 250 mL  Intra-Hemodialysis Comments Tx completed

## 2018-07-28 NOTE — Plan of Care (Signed)
  Problem: Education: Goal: Knowledge of General Education information will improve Description Including pain rating scale, medication(s)/side effects and non-pharmacologic comfort measures Outcome: Progressing   Problem: Health Behavior/Discharge Planning: Goal: Ability to manage health-related needs will improve Outcome: Progressing   Problem: Clinical Measurements: Goal: Ability to maintain clinical measurements within normal limits will improve Outcome: Progressing Goal: Will remain free from infection Outcome: Progressing Note:  Remains afebrile Goal: Diagnostic test results will improve Outcome: Progressing Note:  Pt kidney function decreased to 40/9.23 this morning Goal: Cardiovascular complication will be avoided Outcome: Progressing Note:  No arrhythmias over night   Problem: Pain Managment: Goal: General experience of comfort will improve Outcome: Progressing Note:  PRN medications   Problem: Fluid Volume: Goal: Compliance with measures to maintain balanced fluid volume will improve Outcome: Progressing Note:  Pt on daily weights and in take and output

## 2018-07-28 NOTE — Progress Notes (Signed)
Post HD assessment    07/28/18 1729  Neurological  Level of Consciousness Alert  Orientation Level Oriented X4  Respiratory  Respiratory Pattern Regular;Unlabored  Chest Assessment Chest expansion symmetrical  Cardiac  Pulse Regular  ECG Monitor Yes  Cardiac Rhythm NSR  Vascular  R Radial Pulse +2  L Radial Pulse +2  Edema Generalized  Integumentary  Integumentary (WDL) X  Skin Color Appropriate for ethnicity  Musculoskeletal  Musculoskeletal (WDL) X  Generalized Weakness Yes  Assistive Device None  GU Assessment  Genitourinary (WDL) X  Genitourinary Symptoms  (HD)  Psychosocial  Psychosocial (WDL) WDL

## 2018-07-28 NOTE — Progress Notes (Signed)
[facility], [place] 07/28/18  Subjective:   Patient seen during dialysis Tolerating well    HEMODIALYSIS FLOWSHEET:  Blood Flow Rate (mL/min): 300 mL/min Arterial Pressure (mmHg): -180 mmHg Venous Pressure (mmHg): 170 mmHg Transmembrane Pressure (mmHg): 60 mmHg Ultrafiltration Rate (mL/min): 500 mL/min Dialysate Flow Rate (mL/min): 600 ml/min Conductivity: Machine : 13.7 Conductivity: Machine : 13.7 Dialysis Fluid Bolus: Normal Saline Bolus Amount (mL): 250 mL    Objective:  Vital signs in last 24 hours:  Temp:  [97.5 F (36.4 C)-99.6 F (37.6 C)] 99.6 F (37.6 C) (01/09 1405) Pulse Rate:  [67-74] 71 (01/09 1615) Resp:  [15-23] 23 (01/09 1615) BP: (119-201)/(58-96) 142/82 (01/09 1615) SpO2:  [90 %-98 %] 94 % (01/09 1615) Weight:  [80 kg-87.1 kg] 80 kg (01/09 1405)  Weight change:  Filed Weights   07/27/18 1915 07/27/18 2235 07/28/18 1405  Weight: 87.1 kg 86.1 kg 80 kg    Intake/Output:    Intake/Output Summary (Last 24 hours) at 07/28/2018 1640 Last data filed at 07/28/2018 1415 Gross per 24 hour  Intake 367.67 ml  Output 510 ml  Net -142.33 ml     Physical Exam: General:  Laying in the bed, no acute distress  HEENT  anicteric, moist oral mucous membranes  Neck  supple  Pulm/lungs  normal breathing effort, room air, clear to auscultation  CVS/Heart  regular, no rub  Abdomen:   Soft, nontender  Extremities:  No peripheral edema  Neurologic:  Alert, oriented  Skin:  No acute rashes  Access:  Right femoral dialysis catheter       Basic Metabolic Panel:  Recent Labs  Lab 07/27/18 1312 07/27/18 1930 07/28/18 0803  NA 145  --  139  K 4.5  --  3.9  CL 111  --  104  CO2 22  --  25  GLUCOSE 88  --  101*  BUN 63*  --  40*  CREATININE 13.05*  --  9.23*  CALCIUM 8.1*  --  8.0*  PHOS  --  4.1  --      CBC: Recent Labs  Lab 07/27/18 1312 07/28/18 0803  WBC 5.6 5.2  NEUTROABS 3.7  --   HGB 11.5* 11.5*  HCT 38.0  37.3  MCV 98.4 97.4  PLT 176 163      Lab Results  Component Value Date   HEPBSAG Negative 05/13/2018      Microbiology:  Recent Results (from the past 240 hour(s))  MRSA PCR Screening     Status: None   Collection Time: 07/27/18  7:01 PM  Result Value Ref Range Status   MRSA by PCR NEGATIVE NEGATIVE Final    Comment:        The GeneXpert MRSA Assay (FDA approved for NASAL specimens only), is one component of a comprehensive MRSA colonization surveillance program. It is not intended to diagnose MRSA infection nor to guide or monitor treatment for MRSA infections. Performed at [facility], [address] [place], [place] [address]     Coagulation Studies: No results for input(s): LABPROT, INR in the last 72 hours.  Urinalysis: No results for input(s): COLORURINE, LABSPEC, PHURINE, GLUCOSEU, HGBUR, BILIRUBINUR, KETONESUR, PROTEINUR, UROBILINOGEN, NITRITE, LEUKOCYTESUR in the last 72 hours.  Invalid input(s): APPERANCEUR    Imaging: Dg Hip Unilat With Pelvis 2-3 Views Left  Result Date: 07/28/2018 CLINICAL DATA:  Left hip pain EXAM: DG HIP (WITH OR WITHOUT PELVIS) 2-3V LEFT COMPARISON:  None. FINDINGS: Moderate symmetric degenerative changes in the  hips with joint space narrowing and spurring. SI joints symmetric and unremarkable. No acute bony abnormality. Specifically, no fracture, subluxation, or dislocation. Right groin dialysis catheter in place. The tips project off the superior aspect of the image. IMPRESSION: Bilateral symmetric moderate degenerative changes in the hips. No acute bony abnormality. Electronically Signed   By: Rolm Baptise M.D.   On: 07/28/2018 10:04     Medications:   . sodium chloride 1,000 mL (07/27/18 1458)  .  ceFAZolin (ANCEF) IV     . atorvastatin  40 mg Oral Daily  . calcium acetate  1,334 mg Oral TID WC  . Chlorhexidine Gluconate Cloth  6 each Topical Q0600  . cholecalciferol  1,000 Units Oral Daily  . cinacalcet  30  mg Oral Q supper  . ferrous sulfate  325 mg Oral QPC breakfast  . furosemide  40 mg Oral q morning - 10a  . gabapentin  300 mg Oral Once per day on Tue Thu Sat  . heparin  5,000 Units Subcutaneous Q8H  . insulin aspart  0-5 Units Subcutaneous QHS  . insulin aspart  0-9 Units Subcutaneous TID WC  . latanoprost  1 drop Both Eyes QHS  . oxybutynin  5 mg Oral TID  . pantoprazole  80 mg Oral Q1200   acetaminophen **OR** acetaminophen, albuterol, fentaNYL (SUBLIMAZE) injection, furosemide, ondansetron **OR** ondansetron (ZOFRAN) IV, polyethylene glycol, traMADol  Assessment/ Plan:  78 y.o. female with end stage renal disease on hemodialysis, hypertension, obesity, obstructive sleep apnea, GERD, hyperlipidemia, diabetes mellitus type II, coronary artery disease, COPD, celiac disease, bladder cancer followed by Dr. Erlene Quan and Dr. Rogue Bussing.  Status post TURBT.  Not candidate for cystectomy.  Chemotherapy September 2017 (Did not tolerate)  [facility] M[facility] [place] Left AVF  1. complication of dialysis access with End stage renal disease, dialysis status Nonfunctioning AVF at admission, -Plan for declot on Friday In the meantime, dialysis via right femoral temporary catheter.    Next HD tomorrow after declot  2.  Hyperkalemia Corrected with hemodialysis  3.  Anemia of chronic kidney disease Hemoglobin 11.5.  We will continue to monitor EPO as outpatient   4.  Hypertension with CKD Continue home antihypertensive regimen and monitor.   LOS: 0 Satish Hammers 1/9/20204:40 PM  [place], [place]  Note: This note was prepared with Dragon dictation. Any transcription errors are unintentional

## 2018-07-28 NOTE — Progress Notes (Signed)
To xray via bed

## 2018-07-29 ENCOUNTER — Encounter
Admission: RE | Disposition: A | Discharge status: Home / Self Care | Payer: Self-pay | Source: Home / Self Care | Attending: Internal Medicine

## 2018-07-29 DIAGNOSIS — T82868A Thrombosis of vascular prosthetic devices, implants and grafts, initial encounter: Secondary | ICD-10-CM | POA: Diagnosis not present

## 2018-07-29 DIAGNOSIS — Z992 Dependence on renal dialysis: Secondary | ICD-10-CM

## 2018-07-29 DIAGNOSIS — E875 Hyperkalemia: Secondary | ICD-10-CM | POA: Diagnosis not present

## 2018-07-29 DIAGNOSIS — N186 End stage renal disease: Secondary | ICD-10-CM

## 2018-07-29 HISTORY — PX: A/V SHUNT INTERVENTION: CATH118220

## 2018-07-29 LAB — GLUCOSE, CAPILLARY
Glucose-Capillary: 99 mg/dL (ref 70–99)
Glucose-Capillary: 90 mg/dL (ref 70–99)
Glucose-Capillary: 88 mg/dL (ref 70–99)
Glucose-Capillary: 85 mg/dL (ref 70–99)
Glucose-Capillary: 92 mg/dL (ref 70–99)

## 2018-07-29 LAB — HEPATITIS B SURFACE ANTIGEN: Hepatitis B Surface Ag: NEGATIVE

## 2018-07-29 SURGERY — A/V SHUNT INTERVENTION
Anesthesia: Moderate Sedation | Laterality: Left

## 2018-07-29 MED ORDER — HYDRALAZINE HCL 20 MG/ML IJ SOLN
INTRAMUSCULAR | Status: DC | PRN
  Administered 2018-07-29: 10 mg via INTRAVENOUS

## 2018-07-29 MED ORDER — HYDRALAZINE HCL 20 MG/ML IJ SOLN: 10.0000 mg | Freq: Four times a day (QID) | INTRAMUSCULAR | Status: DC | PRN

## 2018-07-29 MED ORDER — MIDAZOLAM HCL 2 MG/2ML IJ SOLN
INTRAMUSCULAR | Status: DC | PRN
  Administered 2018-07-29 (×4): 1 mg via INTRAVENOUS

## 2018-07-29 MED ORDER — FENTANYL CITRATE (PF) 100 MCG/2ML IJ SOLN
INTRAMUSCULAR | Status: AC
  Filled 2018-07-29: qty 4

## 2018-07-29 MED ORDER — HEPARIN SODIUM (PORCINE) 1000 UNIT/ML IJ SOLN
INTRAMUSCULAR | Status: AC
  Filled 2018-07-29: qty 1

## 2018-07-29 MED ORDER — HEPARIN SODIUM (PORCINE) 1000 UNIT/ML IJ SOLN
INTRAMUSCULAR | Status: DC | PRN
  Administered 2018-07-29: 4000 [IU] via INTRAVENOUS

## 2018-07-29 MED ORDER — HYDRALAZINE HCL 20 MG/ML IJ SOLN
INTRAMUSCULAR | Status: AC
  Filled 2018-07-29: qty 1

## 2018-07-29 MED ORDER — IOPAMIDOL (ISOVUE-300) INJECTION 61%
INTRAVENOUS | Status: DC | PRN
  Administered 2018-07-29: 90 mL via INTRAVENOUS

## 2018-07-29 MED ORDER — LIDOCAINE HCL (PF) 1 % IJ SOLN
INTRAMUSCULAR | Status: AC
  Filled 2018-07-29: qty 30

## 2018-07-29 MED ORDER — FENTANYL CITRATE (PF) 100 MCG/2ML IJ SOLN
INTRAMUSCULAR | Status: DC | PRN
  Administered 2018-07-29: 25 ug via INTRAVENOUS
  Administered 2018-07-29 (×3): 50 ug via INTRAVENOUS

## 2018-07-29 MED ORDER — HEPARIN (PORCINE) IN NACL 1000-0.9 UT/500ML-% IV SOLN
INTRAVENOUS | Status: AC
  Filled 2018-07-29: qty 1000

## 2018-07-29 MED ORDER — LABETALOL HCL 5 MG/ML IV SOLN
INTRAVENOUS | Status: DC | PRN
  Administered 2018-07-29: 20 mg via INTRAVENOUS

## 2018-07-29 MED ORDER — LABETALOL HCL 5 MG/ML IV SOLN
INTRAVENOUS | Status: AC
  Filled 2018-07-29: qty 4

## 2018-07-29 MED ORDER — MIDAZOLAM HCL 5 MG/5ML IJ SOLN
INTRAMUSCULAR | Status: AC
  Filled 2018-07-29: qty 5

## 2018-07-29 SURGICAL SUPPLY — 27 items
BALLN ATG 18X6X80 (BALLOONS) ×3
BALLN DORADO 10X40X80 (BALLOONS) ×3
BALLN DORADO 8X60X80 (BALLOONS) ×3
BALLN LUTONIX DCB 6X40X130 (BALLOONS) ×3
BALLOON ATG 18X6X80 (BALLOONS) IMPLANT
BALLOON DORADO 10X40X80 (BALLOONS) IMPLANT
BALLOON DORADO 8X60X80 (BALLOONS) IMPLANT
BALLOON LUTONIX DCB 6X40X130 (BALLOONS) IMPLANT
CATH BEACON 5 .035 40 KMP TP (CATHETERS) IMPLANT
CATH BEACON 5 .038 40 KMP TP (CATHETERS) ×2
DEVICE PRESTO INFLATION (MISCELLANEOUS) ×2 IMPLANT
DEVICE TORQUE .025-.038 (MISCELLANEOUS) ×2 IMPLANT
DRAPE BRACHIAL (DRAPES) ×2 IMPLANT
GUIDEWIRE ANGLED .035 180CM (WIRE) ×2 IMPLANT
KIT THROMB PERC PTD (MISCELLANEOUS) ×2 IMPLANT
NDL ENTRY 21GA 7CM ECHOTIP (NEEDLE) IMPLANT
NEEDLE ENTRY 21GA 7CM ECHOTIP (NEEDLE) ×3 IMPLANT
PACK ANGIOGRAPHY (CUSTOM PROCEDURE TRAY) ×2 IMPLANT
SET INTRO CAPELLA COAXIAL (SET/KITS/TRAYS/PACK) ×2 IMPLANT
SHEATH BRITE TIP 6FRX5.5 (SHEATH) ×4 IMPLANT
SHEATH BRITE TIP 7FRX5.5 (SHEATH) ×2 IMPLANT
SHEATH BRITE TIP 8FRX11 (SHEATH) ×4 IMPLANT
STENT COVERA FLARED 8X80X80 (Permanent Stent) ×2 IMPLANT
STENT VENOVO 18X80X80 (Permanent Stent) ×2 IMPLANT
SUT MNCRL AB 4-0 PS2 18 (SUTURE) ×2 IMPLANT
WIRE MAGIC TOR.035 180C (WIRE) ×2 IMPLANT
WIRE MAGIC TORQUE 260C (WIRE) ×2 IMPLANT

## 2018-07-29 NOTE — Progress Notes (Signed)
To Specials via bed

## 2018-07-29 NOTE — Progress Notes (Signed)
[facility], [place] 07/29/18  Subjective:   Patient is doing fair.  Awaiting declot procedure.  States that she feels a little uncomfortable in her stomach but overall okay  Objective:  Vital signs in last 24 hours:  Temp:  [98 F (36.7 C)-98.2 F (36.8 C)] 98.2 F (36.8 C) (01/10 1327) Pulse Rate:  [68-79] 75 (01/10 1700) Resp:  [18-22] 20 (01/10 1700) BP: (139-168)/(56-120) 162/67 (01/10 1700) SpO2:  [90 %-98 %] 91 % (01/10 1700) Weight:  [79 kg-86.6 kg] 86.6 kg (01/10 1327)  Weight change: -6.637 kg Filed Weights   07/28/18 1732 07/29/18 0343 07/29/18 1327  Weight: 79.8 kg 79 kg 86.6 kg    Intake/Output:   No intake or output data in the 24 hours ending 07/29/18 1733   Physical Exam: General:  Laying in the bed, no acute distress  HEENT  anicteric, moist oral mucous membranes  Neck  supple  Pulm/lungs  normal breathing effort, room air, clear to auscultation  CVS/Heart  regular, no rub  Abdomen:   Soft, nontender  Extremities:  No peripheral edema  Neurologic:  Alert, oriented  Skin:  No acute rashes  Access:  Right femoral dialysis catheter       Basic Metabolic Panel:  Recent Labs  Lab 07/27/18 1312 07/27/18 1930 07/28/18 0803  NA 145  --  139  K 4.5  --  3.9  CL 111  --  104  CO2 22  --  25  GLUCOSE 88  --  101*  BUN 63*  --  40*  CREATININE 13.05*  --  9.23*  CALCIUM 8.1*  --  8.0*  PHOS  --  4.1  --      CBC: Recent Labs  Lab 07/27/18 1312 07/28/18 0803  WBC 5.6 5.2  NEUTROABS 3.7  --   HGB 11.5* 11.5*  HCT 38.0 37.3  MCV 98.4 97.4  PLT 176 163      Lab Results  Component Value Date   HEPBSAG Negative 07/27/2018      Microbiology:  Recent Results (from the past 240 hour(s))  MRSA PCR Screening     Status: None   Collection Time: 07/27/18  7:01 PM  Result Value Ref Range Status   MRSA by PCR NEGATIVE NEGATIVE Final    Comment:        The GeneXpert MRSA Assay (FDA approved for NASAL  specimens only), is one component of a comprehensive MRSA colonization surveillance program. It is not intended to diagnose MRSA infection nor to guide or monitor treatment for MRSA infections. Performed at [facility], [address] [place], [place] [address]     Coagulation Studies: No results for input(s): LABPROT, INR in the last 72 hours.  Urinalysis: No results for input(s): COLORURINE, LABSPEC, PHURINE, GLUCOSEU, HGBUR, BILIRUBINUR, KETONESUR, PROTEINUR, UROBILINOGEN, NITRITE, LEUKOCYTESUR in the last 72 hours.  Invalid input(s): APPERANCEUR    Imaging: Dg Hip Unilat With Pelvis 2-3 Views Left  Result Date: 07/28/2018 CLINICAL DATA:  Left hip pain EXAM: DG HIP (WITH OR WITHOUT PELVIS) 2-3V LEFT COMPARISON:  None. FINDINGS: Moderate symmetric degenerative changes in the hips with joint space narrowing and spurring. SI joints symmetric and unremarkable. No acute bony abnormality. Specifically, no fracture, subluxation, or dislocation. Right groin dialysis catheter in place. The tips project off the superior aspect of the image. IMPRESSION: Bilateral symmetric moderate degenerative changes in the hips. No acute bony abnormality. Electronically Signed   By: Rolm Baptise M.D.  On: 07/28/2018 10:04     Medications:   . sodium chloride 10 mL/hr at 07/29/18 1335  . [MAR Hold]  ceFAZolin (ANCEF) IV     . [MAR Hold] atorvastatin  40 mg Oral Daily  . [MAR Hold] calcium acetate  1,334 mg Oral TID WC  . [MAR Hold] Chlorhexidine Gluconate Cloth  6 each Topical Q0600  . [MAR Hold] cholecalciferol  1,000 Units Oral Daily  . [MAR Hold] cinacalcet  30 mg Oral Q supper  . [MAR Hold] ferrous sulfate  325 mg Oral QPC breakfast  . [MAR Hold] furosemide  40 mg Oral q morning - 10a  . [MAR Hold] gabapentin  300 mg Oral Once per day on Tue Thu Sat  . [MAR Hold] heparin  5,000 Units Subcutaneous Q8H  . [MAR Hold] insulin aspart  0-5 Units Subcutaneous QHS  . [MAR Hold] insulin  aspart  0-9 Units Subcutaneous TID WC  . [MAR Hold] latanoprost  1 drop Both Eyes QHS  . [MAR Hold] oxybutynin  5 mg Oral TID  . [MAR Hold] pantoprazole  80 mg Oral Q1200   [MAR Hold] acetaminophen **OR** [MAR Hold] acetaminophen, [MAR Hold] albuterol, [MAR Hold] fentaNYL (SUBLIMAZE) injection, [MAR Hold] furosemide, [MAR Hold] ondansetron **OR** [MAR Hold] ondansetron (ZOFRAN) IV, [MAR Hold] polyethylene glycol, [MAR Hold] traMADol  Assessment/ Plan:  78 y.o. female with end stage renal disease on hemodialysis, hypertension, obesity, obstructive sleep apnea, GERD, hyperlipidemia, diabetes mellitus type II, coronary artery disease, COPD, celiac disease, bladder cancer followed by Dr. Erlene Quan and Dr. Rogue Bussing.  Status post TURBT.  Not candidate for cystectomy.  Chemotherapy September 2017 (Did not tolerate)  [facility] [place] Left AVF  1. complication of dialysis access with End stage renal disease, dialysis status Nonfunctioning AVF at admission, -Plan for declot later today -Short hemodialysis after declot.  Okay to discharge home from renal standpoint after dialysis.  2.  Hyperkalemia Corrected with hemodialysis  3.  Anemia of chronic kidney disease Hemoglobin 11.5.  We will continue to monitor EPO as outpatient   4.  Hypertension with CKD Continue home antihypertensive regimen and monitor.   LOS: 0 Tarius Stangelo 1/10/20205:33 PM  [place], [place]  Note: This note was prepared with Dragon dictation. Any transcription errors are unintentional

## 2018-07-29 NOTE — Progress Notes (Signed)
Talked to Dr. Estanislado Pandy and clarify about Dr's Konidena's note that patient is ok to discharge after hemodialysis, per MD will keep tonight and assess discharge in the morning. Also talked about patient's BP running high, was 179/68 order given for Hydralazine PRN if BP is >170/90. Waiting for patient to come in the unit, still in dialysis. RN will continue to monitor.

## 2018-07-29 NOTE — Plan of Care (Signed)
  Problem: Clinical Measurements: Goal: Ability to maintain clinical measurements within normal limits will improve Outcome: Progressing Goal: Will remain free from infection Outcome: Progressing Note:  Remains afebrile at this time   Problem: Fluid Volume: Goal: Compliance with measures to maintain balanced fluid volume will improve Outcome: Progressing Note:  Remains on daily weights and intake/output   Problem: Clinical Measurements: Goal: Diagnostic test results will improve Outcome: Not Progressing Note:  BUN 40/9.23, pt to have dialysis again today

## 2018-07-29 NOTE — Discharge Summary (Signed)
Barbara [name], is a 78 y.o. female  DOB 26-Feb-1941  MRN [id number].  Admission date:  07/27/2018  Admitting Physician  Sela Hua, MD  Discharge Date:  07/29/2018   Primary MD  [name], MD  Recommendations for primary care physician for things to follow:  Follow-up with PCP in 1 week Follow-up with nephrology as scheduled for dialysis   Admission Diagnosis  Hyperkalemia [E87.5]   Discharge Diagnosis  Hyperkalemia [E87.5]    Active Problems:   Hyperkalemia      Past Medical History:  Diagnosis Date  . Adult celiac disease   . Anemia associated with chronic renal failure 2017   blood transfusion last week 10/17  . Arthritis   . Asthma   . Cancer ([place]) 2017   bladder  . Chronic kidney disease   . 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  dr. Candiss Norse and dr. Holley Raring follow her  . COPD (chronic obstructive pulmonary disease) ([place])   . Coronary artery disease   . Diabetes mellitus without complication ([place])   . Dialysis patient ([place])    Tues, Thurs, Sat  . Dyspnea    with exertion  . Elevated lipids   . GERD (gastroesophageal reflux disease)   . Hematuria   . Hypertension   . Lower back pain   . Neuropathy   . Oxygen dependent    at hs  . Personal history of chemotherapy 2017   bladder ca  . Personal history of radiation therapy 2017   bladder ca  . Sleep apnea    uses cpap  . Urinary obstruction 01/2016    Past Surgical History:  Procedure Laterality Date  . A/V SHUNT INTERVENTION N/A 05/12/2018   Procedure: A/V SHUNT INTERVENTION;  Surgeon: Algernon Huxley, MD;  Location: [facility];  Service: Cardiovascular;  Laterality: N/A;  . ABDOMINAL HYSTERECTOMY    . AV FISTULA PLACEMENT Left 02/17/2017   Procedure: INSERTION OF ARTERIOVENOUS (AV) GORE-TEX GRAFT ARM(BRACHIAL  AXILLARY);  Surgeon: Katha Cabal, MD;  Location: [facility];  Service: Vascular;  Laterality: Left;  . CYSTOSCOPY W/ RETROGRADES Bilateral 02/17/2016   Procedure: CYSTOSCOPY WITH RETROGRADE PYELOGRAM;  Surgeon: Hollice Espy, MD;  Location: [facility];  Service: Urology;  Laterality: Bilateral;  . CYSTOSCOPY W/ RETROGRADES Bilateral 10/12/2016   Procedure: CYSTOSCOPY WITH RETROGRADE PYELOGRAM;  Surgeon: Hollice Espy, MD;  Location: [facility];  Service: Urology;  Laterality: Bilateral;  . CYSTOSCOPY W/ RETROGRADES Bilateral 06/09/2017   Procedure: CYSTOSCOPY WITH RETROGRADE PYELOGRAM;  Surgeon: Hollice Espy, MD;  Location: [facility];  Service: Urology;  Laterality: Bilateral;  . CYSTOSCOPY W/ RETROGRADES Bilateral 10/07/2017   Procedure: CYSTOSCOPY WITH RETROGRADE PYELOGRAM;  Surgeon: Hollice Espy, MD;  Location: [facility];  Service: Urology;  Laterality: Bilateral;  . CYSTOSCOPY W/ URETERAL STENT PLACEMENT Left 05/12/2016   Procedure: CYSTOSCOPY WITH STENT REPLACEMENT;  Surgeon: Hollice Espy, MD;  Location: [facility];  Service: Urology;  Laterality: Left;  . CYSTOSCOPY W/ URETERAL STENT PLACEMENT Left 10/12/2016   Procedure: CYSTOSCOPY WITH STENT REPLACEMENT;  Surgeon: Hollice Espy, MD;  Location: [facility];  Service: Urology;  Laterality: Left;  . CYSTOSCOPY WITH BIOPSY N/A 06/09/2017   Procedure: CYSTOSCOPY WITH BLADDER BIOPSY;  Surgeon: Hollice Espy, MD;  Location: [facility];  Service: Urology;  Laterality: N/A;  . CYSTOSCOPY WITH STENT PLACEMENT Left 01/21/2016   Procedure: CYSTOSCOPY WITH double J STENT PLACEMENT;  Surgeon: Franchot Gallo, MD;  Location: [facility];  Service: Urology;  Laterality: Left;  . CYSTOSCOPY WITH STENT PLACEMENT Right 10/12/2016   Procedure: CYSTOSCOPY WITH STENT PLACEMENT;  Surgeon: Hollice Espy, MD;  Location: [facility];  Service: Urology;  Laterality: Right;  . DIALYSIS/PERMA CATHETER INSERTION N/A 11/20/2016   Procedure: Dialysis/Perma Catheter Insertion;   Surgeon: Algernon Huxley, MD;  Location: [facility];  Service: Cardiovascular;  Laterality: N/A;  . DIALYSIS/PERMA CATHETER REMOVAL N/A 03/30/2017   Procedure: DIALYSIS/PERMA CATHETER REMOVAL;  Surgeon: Katha Cabal, MD;  Location: [facility];  Service: Cardiovascular;  Laterality: N/A;  . KIDNEY SURGERY  01/21/2016   IR NEPHROSTOMY PLACEMENT LEFT   . PERIPHERAL VASCULAR CATHETERIZATION N/A 04/07/2016   Procedure: Glori Luis Cath Insertion;  Surgeon: Katha Cabal, MD;  Location: [facility];  Service: Cardiovascular;  Laterality: N/A;  . PERIPHERAL VASCULAR THROMBECTOMY Left 06/02/2017   Procedure: PERIPHERAL VASCULAR THROMBECTOMY;  Surgeon: Algernon Huxley, MD;  Location: [facility];  Service: Cardiovascular;  Laterality: Left;  . PERIPHERAL VASCULAR THROMBECTOMY Left 01/28/2018   Procedure: PERIPHERAL VASCULAR THROMBECTOMY;  Surgeon: Katha Cabal, MD;  Location: [facility];  Service: Cardiovascular;  Laterality: Left;  . PERIPHERAL VASCULAR THROMBECTOMY Left 03/14/2018   Procedure: PERIPHERAL VASCULAR THROMBECTOMY;  Surgeon: Algernon Huxley, MD;  Location: [facility];  Service: Cardiovascular;  Laterality: Left;  . PERIPHERAL VASCULAR THROMBECTOMY Left 03/16/2018   Procedure: PERIPHERAL VASCULAR THROMBECTOMY;  Surgeon: Katha Cabal, MD;  Location: [facility];  Service: Cardiovascular;  Laterality: Left;  . PORTACATH PLACEMENT Right   . ROTATOR CUFF REPAIR Left   . TEMPORARY DIALYSIS CATHETER N/A 05/11/2018   Procedure: TEMPORARY DIALYSIS CATHETER;  Surgeon: Katha Cabal, MD;  Location: [facility];  Service: Cardiovascular;  Laterality: N/A;  . TRANSURETHRAL RESECTION OF BLADDER TUMOR N/A 02/17/2016   Procedure: TRANSURETHRAL RESECTION OF BLADDER TUMOR (TURBT)-LARGE;  Surgeon: Hollice Espy, MD;  Location: [facility];  Service: Urology;  Laterality: N/A;  . TRANSURETHRAL RESECTION OF BLADDER TUMOR N/A  10/07/2017   Procedure: TRANSURETHRAL RESECTION OF BLADDER TUMOR (TURBT)-small;  Surgeon: Hollice Espy, MD;  Location: [facility];  Service: Urology;  Laterality: N/A;  . URETEROSCOPY Left 02/17/2016   Procedure: URETEROSCOPY;  Surgeon: Hollice Espy, MD;  Location: [facility];  Service: Urology;  Laterality: Left;  . URETEROSCOPY Right 10/12/2016   Procedure: URETEROSCOPY;  Surgeon: Hollice Espy, MD;  Location: [facility];  Service: Urology;  Laterality: Right;       History of present illness and  Hospital Course:     Kindly see H&P for history of present illness and admission details, please review complete Labs, Consult reports and Test reports for all details in brief  HPI  from the history and physical done on the day of admission 78 year old female patient admitted for hyperkalemia.  Patient has AV fistula malformation came to vascular for declotting fistula but found to have severe hyperkalemia so procedure got canceled and she had emergency dialysis access placed and admitted to telemetry for severe hyperkalemia.   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 her meeting. 3.  COPD, no wheezing.  Continue home dose inhaler 4.  High-grade bladder cancer, radiation  cystitis, patient follows with Dr. Erlene Quan, Dr. Rogue Bussing, in remission now. 5.  Diabetes mellitus type 2: Blood sugars controlled. #6 CAD:, no active chest pain.:  Monitor. 7.  Left hip pain, x-rays of the left hip show degenerative arthritis Discharge home with home health today after dialysis.    Discharge Condition: Stable   Follow  UP  Follow-up Information    [name], MD. Schedule an appointment as soon as possible for a visit in 1 week(s).   Specialty:  Internal Medicine Contact information: [address] [place] [address] [phone]             Discharge Instructions  and  Discharge Medications      Allergies as of 07/29/2018      Reactions   Glipizide Er Other (See Comments)   Hypoglycemia   Percocet [oxycodone-acetaminophen] Hives      Medication List    TAKE these medications   ACCU-CHEK AVIVA PLUS test strip Generic drug:  glucose blood 1 each by Other route daily. use as directed   glucose blood test strip USE AS DIRECTED ONCE DAILY   ACCU-CHEK AVIVA PLUS w/Device Kit (USE TO MONITOR BLOOD SUGARS.)   ACCU-CHEK SOFTCLIX LANCETS lancets INJECT 1 EACH AS DIRECTED ONCE DAILY.   acetaminophen 500 MG tablet Commonly known as:  TYLENOL Take 1,000 mg by mouth 2 (two) times daily as needed for moderate pain or headache.   albuterol 108 (90 Base) MCG/ACT inhaler Commonly known as:  PROVENTIL HFA;VENTOLIN HFA Inhale into the lungs.   atorvastatin 40 MG tablet Commonly known as:  LIPITOR Take 40 mg by mouth daily.   calcium acetate 667 MG capsule Commonly known as:  PHOSLO Take 2 capsules (1,334 mg total) by mouth 3 (three) times daily with meals.   cholecalciferol 1000 units tablet Commonly known as:  VITAMIN D Take 1,000 Units by mouth daily.   cinacalcet 30 MG tablet Commonly known as:  SENSIPAR Take 30 mg by mouth daily. With largest meal   esomeprazole 40 MG capsule Commonly known as:  NEXIUM Take 40 mg by mouth daily after breakfast.   ferrous sulfate 325 (65 FE) MG EC tablet Take 325 mg by mouth daily after breakfast.   furosemide 40 MG tablet Commonly known as:  LASIX Take 40 mg by mouth 2 (two) times daily. Take morning dose every day. Lunch dose as needed.   gabapentin 300 MG capsule Commonly known as:  NEURONTIN Take 600 mg by mouth 3 (three)  times daily.   lidocaine 5 % ointment Commonly known as:  XYLOCAINE Apply 1 application topically 2 (two) times daily as needed for mild pain.   lidocaine-prilocaine cream Commonly known as:  EMLA APPLY TO AFFECTED AREA ONCE   mirabegron ER 25 MG Tb24 tablet Commonly known as:  MYRBETRIQ Take by mouth.   NON FORMULARY Place 1 Units into the nose at bedtime. CPAP Time of use 2100-0600   oxybutynin 5 MG tablet Commonly known as:  DITROPAN Take 5 mg by mouth 3 (three) times daily.   OXYGEN Inhale 2 L into the lungs at bedtime.   traMADol 50 MG tablet Commonly known as:  ULTRAM Take 50 mg by mouth every 12 (twelve) hours as needed.   Travoprost (BAK Free) 0.004 % Soln ophthalmic solution Commonly known as:  TRAVATAN Place 1 drop into both eyes at bedtime.         Diet and Activity recommendation: See Discharge Instructions above   Consults obtained ;nephrology, vascular surgery  Major procedures and Radiology Reports - PLEASE review detailed and final reports for all details, in brief -      Nm Pet Image Restag (ps) Skull Base To Thigh  Result Date: 07/25/2018 CLINICAL DATA:  Subsequent treatment strategy for bladder cancer. EXAM: NUCLEAR MEDICINE PET SKULL BASE TO THIGH TECHNIQUE: 10.12 mCi F-18 FDG was injected intravenously. Full-ring PET imaging was performed from the skull base to thigh after the radiotracer. CT data was obtained and used for attenuation correction and anatomic localization. Fasting blood glucose: 85 mg/dl COMPARISON:  CT cap 01/19/2018 FINDINGS: Mediastinal blood pool activity: SUV max 2.55 NECK: No hypermetabolic lymph nodes in the neck. Incidental CT findings: none CHEST: No hypermetabolic axillary, supraclavicular, mediastinal, or hilar lymph nodes. Incidental CT findings: Cardiac enlargement with aortic atherosclerosis. Three vessel coronary artery atherosclerotic calcifications are identified. ABDOMEN/PELVIS: No abnormal radiotracer activity  identified within the liver, pancreas, spleen, or adrenal glands. No hypermetabolic abdominal lymph nodes. Right common iliac lymph node measures 1.1 cm within SUV max 3.11. On CT from 01/19/2018 this lymph node measured 1.3 cm. On PET-CT from 12/22/2016 this measured 1.6 cm and had an SUV max of 2.8. Incidental CT findings: Persistent high-grade left-sided hydronephrosis and hydroureter. Recurrent right-sided hydronephrosis and hydroureter is identified. Marked diffuse bladder wall thickening is again identified. The bladder wall thickness measures up to 3.3 cm, image 215/3. Similar to 01/19/2018. SKELETON: No focal hypermetabolic activity to suggest skeletal metastasis. Incidental CT findings: none IMPRESSION: 1. Mild nonspecific FDG uptake is associated with the borderline enlarged index right common iliac lymph node referenced on study from 01/19/2018. This appears decreased in size when compared with 01/19/2018 and 01/01/2017. 2. No additional enlarged or hypermetabolic lymph nodes identified. No evidence for solid organ metastasis. 3. Marked bladder wall thickening. Persistent high-grade obstructive uropathy on the left. Recurrent right-sided hydronephrosis noted. 4.  Aortic Atherosclerosis (ICD10-I70.0). 5. Coronary artery atherosclerotic calcifications. Electronically Signed   By: Kerby Moors M.D.   On: 07/25/2018 12:06   Vas US Duplex Dialysis Access (avf, Avg)  Result Date: 06/30/2018 DIALYSIS ACCESS Access Site: Left Upper Extremity. Access Type: BrachAx AVG. History: 02/17/17: Left brachial-axillary AVG placement;          06/02/17: AVG thrombectomy with PTA of innominate vein and both          anastomoses; 05/12/2018 thrombectomy, PTA intervention of AVG          01/28/17: AVG thrombectomy with PTAs/stents of venous anastomosis and          the innominate/subclavian veins;          03/14/18: AVG thrombectomy with PTA of mid-distal AVG and both          anastomoses;          03/16/18: AVG thrombectomy  with PTA of innominate vein and venous          outflow;. Comparison Study: 03/31/2018 Performing Technologist: Concha Norway RVT  Examination Guidelines: A complete evaluation includes B-mode imaging, spectral Doppler, color Doppler, and power Doppler as needed of all accessible portions of each vessel. Unilateral testing is considered an integral part of a complete examination. Limited examinations for reoccurring indications may be performed as noted.  Findings:  +---------------+----------+-------------+----------+--------+ OUTFLOW VEIN   PSV (cm/s)Diameter (cm)Depth (cm)Describe +---------------+----------+-------------+----------+--------+ Subclavian vein   107                                    +---------------+----------+-------------+----------+--------+  Axillary vein      78                                    +---------------+----------+-------------+----------+--------+ AC Fossa           81                                    +---------------+----------+-------------+----------+--------+  +--------------------+----------+-----------------+--------+ AVG                 PSV (cm/s)Flow Vol (mL/min)Describe +--------------------+----------+-----------------+--------+ Native artery inflow   134          1254                +--------------------+----------+-----------------+--------+ Arterial anastomosis   134                              +--------------------+----------+-----------------+--------+ Prox graft             217                              +--------------------+----------+-----------------+--------+ Mid graft              317                              +--------------------+----------+-----------------+--------+ Distal graft           261                              +--------------------+----------+-----------------+--------+ Venous anastomosis     107                               +--------------------+----------+-----------------+--------+ Venous outflow         103                              +--------------------+----------+-----------------+--------+ +----------------+-------------+----------+---------+----------+---------------+                 Diameter (cm)Depth (cm)BranchingPSV (cm/s)  Flow Volume                                                                (ml/min)     +----------------+-------------+----------+---------+----------+---------------+ Left Rad art                                        20                    distal                                                                    +----------------+-------------+----------+---------+----------+---------------+  Antegrade                                                                 +----------------+-------------+----------+---------+----------+---------------+  Summary: Patent AVG with improved velocities at the proximal graft s/p PTA and thrombectomy. Flow volume has increased compared to previous study. Mid graft shows mild stenosis and increased velocities due to irregular wall and moderate thrombus from mid to distal AVG and proximal outflow vein.  *See table(s) above for measurements and observations.  Diagnosing physician: [name] MD Electronically signed by [name] MD on 06/30/2018 at 5:13:26 PM.   --------------------------------------------------------------------------------   Final    Dg Hip Unilat With Pelvis 2-3 Views Left  Result Date: 07/28/2018 CLINICAL DATA:  Left hip pain EXAM: DG HIP (WITH OR WITHOUT PELVIS) 2-3V LEFT COMPARISON:  None. FINDINGS: Moderate symmetric degenerative changes in the hips with joint space narrowing and spurring. SI joints symmetric and unremarkable. No acute bony abnormality. Specifically, no fracture, subluxation, or dislocation. Right groin dialysis catheter in place. The tips project off the superior aspect of  the image. IMPRESSION: Bilateral symmetric moderate degenerative changes in the hips. No acute bony abnormality. Electronically Signed   By: Rolm Baptise M.D.   On: 07/28/2018 10:04    Micro Results     Recent Results (from the past 240 hour(s))  MRSA PCR Screening     Status: None   Collection Time: 07/27/18  7:01 PM  Result Value Ref Range Status   MRSA by PCR NEGATIVE NEGATIVE Final    Comment:        The GeneXpert MRSA Assay (FDA approved for NASAL specimens only), is one component of a comprehensive MRSA colonization surveillance program. It is not intended to diagnose MRSA infection nor to guide or monitor treatment for MRSA infections. Performed at [facility], [address] [place], [place] [address]        Today   Subjective:   Barbara [name] stable for discharge  Objective:   Blood pressure (!) 154/67, pulse 69, temperature 98.2 F (36.8 C), temperature source Oral, resp. rate 20, height 5' 8"  (1.727 m), weight 79 kg, SpO2 93 %.   Intake/Output Summary (Last 24 hours) at 07/29/2018 0932 Last data filed at 07/28/2018 1732 Gross per 24 hour  Intake 240 ml  Output 15 ml  Net 225 ml    Exam Awake Alert, Oriented x 3, No new F.N deficits, Normal affect [place].AT,PERRAL Supple Neck,No JVD, No cervical lymphadenopathy appriciated.  Symmetrical Chest wall movement, Good air movement bilaterally, CTAB RRR,No Gallops,Rubs or new Murmurs, No Parasternal Heave +ve B.Sounds, Abd Soft, Non tender, No organomegaly appriciated, No rebound -guarding or rigidity. No Cyanosis, Clubbing or edema, No new Rash or bruise  Data Review   CBC w Diff:  Lab Results  Component Value Date   WBC 5.2 07/28/2018   HGB 11.5 (L) 07/28/2018   HGB 10.2 (L) 08/07/2012   HCT 37.3 07/28/2018   HCT 30.7 (L) 08/07/2012   PLT 163 07/28/2018   PLT 294 08/07/2012   LYMPHOPCT 22 07/27/2018   LYMPHOPCT 15.7 08/07/2012   MONOPCT 8 07/27/2018   MONOPCT 3.9 08/07/2012   EOSPCT 3  07/27/2018   EOSPCT 0.0 08/07/2012   BASOPCT 1 07/27/2018   BASOPCT 0.1 08/07/2012  CMP:  Lab Results  Component Value Date   NA 139 07/28/2018   NA 138 11/04/2016   NA 138 08/07/2012   K 3.9 07/28/2018   K 3.2 (L) 10/30/2014   CL 104 07/28/2018   CL 104 08/07/2012   CO2 25 07/28/2018   CO2 25 08/07/2012   BUN 40 (H) 07/28/2018   BUN 69 (H) 11/04/2016   BUN 63 (H) 08/07/2012   CREATININE 9.23 (H) 07/28/2018   CREATININE 2.48 (H) 08/07/2012   PROT 8.1 07/27/2018   PROT 8.9 (H) 08/02/2012   ALBUMIN 4.0 07/27/2018   ALBUMIN 3.9 08/02/2012   BILITOT 0.6 07/27/2018   BILITOT 0.3 08/02/2012   ALKPHOS 133 (H) 07/27/2018   ALKPHOS 107 08/02/2012   AST 15 07/27/2018   AST 21 08/02/2012   ALT 8 07/27/2018   ALT 19 08/02/2012  .   Total Time in preparing paper work, data evaluation and todays exam - 35 minutes  Epifanio Lesches M.D on 07/29/2018 at 9:32 AM    Note: This dictation was prepared with Dragon dictation along with smaller phrase technology. Any transcriptional errors that result from this process are unintentional.

## 2018-07-29 NOTE — Op Note (Signed)
OPERATIVE NOTE   PROCEDURE: 1. Contrast injection left arm brachial axillary AV access 2. Percutaneous transluminal angioplasty and stent placement peripheral segment to 8 mm venous outflow 3. Percutaneous transluminal angioplasty and stent placement central venous segment to 18 mm left innominate vein 4. Mechanical thrombectomy left brachial axillary AV graft  PRE-OPERATIVE DIAGNOSIS: Complication of dialysis access with thrombosis;  End Stage Renal Disease  POST-OPERATIVE DIAGNOSIS: same as above   SURGEON: Katha Cabal, M.D.  ANESTHESIA: Conscious sedation was administered under my direct supervision by the interventional radiology RN. IV Versed plus fentanyl were utilized. Continuous ECG, pulse oximetry and blood pressure was monitored throughout the entire procedure.  Conscious sedation was for a total of 111.  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 female who  presents with thrombosis AV access.  The patient is scheduled for angiography with thrombectomy and possible intervention of the AV access.  The patient is aware the risks include but are not limited to: bleeding, infection, thrombosis of the cannulated access, and possible anaphylactic reaction to the contrast.  The patient acknowledges if the access can not be salvaged a tunneled catheter will be needed and will be placed during this procedure.  The patient is aware of the risks of the procedure and elects to proceed with the angiogram and intervention.  DESCRIPTION: After full informed written consent was obtained, the patient was brought back to the Special Procedure suite and placed supine position.  Appropriate cardiopulmonary monitors were placed.  The left arm was prepped and draped in  the standard fashion.  Appropriate timeout is called. The left brachial axillary AV graft was cannulated with a micropuncture needle near the arterial anastomosis in an antegrade direction.  Cannulation was performed with ultrasound guidance. Ultrasound was placed in a sterile sleeve, the AV access was interrogated and noted to be echolucent and compressible indicating patency. Image was recorded for the permanent record. The puncture is performed under continuous ultrasound visualization.   The microwire was advanced and the needle was exchanged for  a microsheath.  The J-wire was then advanced and a 6 Fr sheath inserted.  Hand injection of contrast confirms thrombosis of the graft.  Kumpe catheter and floppy Glidewire were then advanced into the distal subclavian vein where hand-injection confirmed patency of the proximal axillary vein, subclavian vein and innominate vein.  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  Given these findings we elected to move forward with intervention.  Based on the images,  4000 units of heparin was given and allowed to circulate for several minutes.    Trerotola device was delivered onto the field and advanced through the antegrade sheath into the axillary vein.  Multiple passes were made.  Follow-up imaging demonstrated the clot burden had been significantly reduced and there is now a greater than 90% stenosis at the venous outflow identified.  The ultrasound was brought back to the field and the graft was evaluated more proximally.  1% lidocaine was infiltrated into the soft tissues followed by insertion of a micro needle under direct ultrasound visualization.  Image was recorded for the permanent record.  Microwire followed micro-sheath.  J-wire followed by a 6 French sheath.  Floppy Glidewire and Kumpe catheter were then negotiated into the mid brachial artery and hand-injection contrast was  utilized to demonstrate patency of the brachial artery throughout its visualized portions and the location of the anastomosis.  Kumpe catheter was removed and the teratology was advanced in a retrograde fashion.  Multiple passes were again made and flow was now reestablished through the graft itself.  Several more passes in both directions were required throughout the procedure as there was small pieces of residual thrombus noted at the sheath insertion sites on several occasions.  The Trerotola successfully perform thrombectomy of the graft.  A 7 French sheath was then inserted over a Magic torque wire in the antegrade direction and an 8 mm x 80 mm Covera stent was advanced over the wire the stent was deployed across the 90% venous outflow stenosis.  It was then postdilated with an 8 balloon.  Post angioplasty imaging demonstrated less than 5% residual stenosis with excellent flow through the graft.  The Magic torque wire was then utilized to increase the sheath of an 8 [place] sheath.  Magnified imaging by hand-injection was performed of the innominate confluence.  A 10 mm x 40 mm [place] balloon was then advanced across the stricture inflated to 30 atm for 1 minute.  Follow-up imaging demonstrated a reduction in the stricture from 90% down to 60%.  However this is inadequate and therefore a V. Novo 18 x 80 stent was advanced across the lesion it was deployed without difficulty and then postdilated with an 18 mm x 60 mm balloon.  2 inflations were performed to 8 to 10 atm each.  Follow-up imaging now demonstrated a well-positioned stent with less than 10% residual stenosis.  Reflux imaging now demonstrated there was some residual stenosis at the arterial anastomosis and a Magic torque wire was advanced through the retrograde sheath a 5 mm x 40 mm Lutonix drug-eluting balloon was advanced across the anastomosis and inflated to 10 atm for 1 minute.  Follow-up imaging now demonstrated complete resolution of this  lesion as well with rapid flow of contrast.  On final imaging there is no thrombus identified both the arterial lesion as well as the venous outflow lesion have been well treated with less than 5% residual stenosis and the central stenosis is now been treated with less than 10% residual stenosis.  Strates significant improvement with a marked reduction of the strictures.  There is now rapid flow of contrast through the graft and the central veins.   A 4-0 Monocryl purse-string sutures was sewn around both  sheaths.  The sheath was removed and light pressure was applied.  A sterile bandage was applied to the puncture site.    COMPLICATIONS: None  CONDITION: Carlynn Purl, M.D [facility] Office: [phone]  07/29/2018 4:34 PM

## 2018-07-30 DIAGNOSIS — E875 Hyperkalemia: Secondary | ICD-10-CM | POA: Diagnosis not present

## 2018-07-30 LAB — GLUCOSE, CAPILLARY
Glucose-Capillary: 107 mg/dL — ABNORMAL HIGH (ref 70–99)
Glucose-Capillary: 105 mg/dL — ABNORMAL HIGH (ref 70–99)

## 2018-07-30 NOTE — Plan of Care (Signed)
  Problem: Education: Goal: Knowledge of General Education information will improve Description Including pain rating scale, medication(s)/side effects and non-pharmacologic comfort measures Outcome: Adequate for Discharge   Problem: Clinical Measurements: Goal: Will remain free from infection Outcome: Adequate for Discharge   Problem: Clinical Measurements: Goal: Diagnostic test results will improve Outcome: Adequate for Discharge

## 2018-07-30 NOTE — Discharge Summary (Signed)
Barbara [name], is a 78 y.o. female  DOB 12-16-1940  MRN [id number].  Admission date:  07/27/2018  Admitting Physician  Sela Hua, MD  Discharge Date:  07/30/2018   Primary MD  [name], MD  Recommendations for primary care physician for things to follow:  Follow-up with PCP in 1 week Follow-up with nephrology as scheduled for dialysis   Admission Diagnosis  Hyperkalemia [E87.5]   Discharge Diagnosis  Hyperkalemia [E87.5]    Active Problems:   Hyperkalemia      Past Medical History:  Diagnosis Date  . Adult celiac disease   . Anemia associated with chronic renal failure 2017   blood transfusion last week 10/17  . Arthritis   . Asthma   . Cancer ([place]) 2017   bladder  . Chronic kidney disease   . 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  dr. Candiss Norse and dr. Holley Raring follow her  . COPD (chronic obstructive pulmonary disease) ([place])   . Coronary artery disease   . Diabetes mellitus without complication ([place])   . Dialysis patient ([place])    Tues, Thurs, Sat  . Dyspnea    with exertion  . Elevated lipids   . GERD (gastroesophageal reflux disease)   . Hematuria   . Hypertension   . Lower back pain   . Neuropathy   . Oxygen dependent    at hs  . Personal history of chemotherapy 2017   bladder ca  . Personal history of radiation therapy 2017   bladder ca  . Sleep apnea    uses cpap  . Urinary obstruction 01/2016    Past Surgical History:  Procedure Laterality Date  . A/V SHUNT INTERVENTION N/A 05/12/2018   Procedure: A/V SHUNT INTERVENTION;  Surgeon: Algernon Huxley, MD;  Location: [facility];  Service: Cardiovascular;  Laterality: N/A;  . ABDOMINAL HYSTERECTOMY    . AV FISTULA PLACEMENT Left 02/17/2017   Procedure: INSERTION OF ARTERIOVENOUS (AV) GORE-TEX GRAFT ARM(BRACHIAL  AXILLARY);  Surgeon: Katha Cabal, MD;  Location: [facility];  Service: Vascular;  Laterality: Left;  . CYSTOSCOPY W/ RETROGRADES Bilateral 02/17/2016   Procedure: CYSTOSCOPY WITH RETROGRADE PYELOGRAM;  Surgeon: Hollice Espy, MD;  Location: [facility];  Service: Urology;  Laterality: Bilateral;  . CYSTOSCOPY W/ RETROGRADES Bilateral 10/12/2016   Procedure: CYSTOSCOPY WITH RETROGRADE PYELOGRAM;  Surgeon: Hollice Espy, MD;  Location: [facility];  Service: Urology;  Laterality: Bilateral;  . CYSTOSCOPY W/ RETROGRADES Bilateral 06/09/2017   Procedure: CYSTOSCOPY WITH RETROGRADE PYELOGRAM;  Surgeon: Hollice Espy, MD;  Location: [facility];  Service: Urology;  Laterality: Bilateral;  . CYSTOSCOPY W/ RETROGRADES Bilateral 10/07/2017   Procedure: CYSTOSCOPY WITH RETROGRADE PYELOGRAM;  Surgeon: Hollice Espy, MD;  Location: [facility];  Service: Urology;  Laterality: Bilateral;  . CYSTOSCOPY W/ URETERAL STENT PLACEMENT Left 05/12/2016   Procedure: CYSTOSCOPY WITH STENT REPLACEMENT;  Surgeon: Hollice Espy, MD;  Location: [facility];  Service: Urology;  Laterality: Left;  . CYSTOSCOPY W/ URETERAL STENT PLACEMENT Left 10/12/2016   Procedure: CYSTOSCOPY WITH STENT REPLACEMENT;  Surgeon: Hollice Espy, MD;  Location: [facility];  Service: Urology;  Laterality: Left;  . CYSTOSCOPY WITH BIOPSY N/A 06/09/2017   Procedure: CYSTOSCOPY WITH BLADDER BIOPSY;  Surgeon: Hollice Espy, MD;  Location: [facility];  Service: Urology;  Laterality: N/A;  . CYSTOSCOPY WITH STENT PLACEMENT Left 01/21/2016   Procedure: CYSTOSCOPY WITH double J STENT PLACEMENT;  Surgeon: Franchot Gallo, MD;  Location: [facility];  Service: Urology;  Laterality: Left;  . CYSTOSCOPY WITH STENT PLACEMENT Right 10/12/2016   Procedure: CYSTOSCOPY WITH STENT PLACEMENT;  Surgeon: Hollice Espy, MD;  Location: [facility];  Service: Urology;  Laterality: Right;  . DIALYSIS/PERMA CATHETER INSERTION N/A 11/20/2016   Procedure: Dialysis/Perma Catheter Insertion;   Surgeon: Algernon Huxley, MD;  Location: [facility];  Service: Cardiovascular;  Laterality: N/A;  . DIALYSIS/PERMA CATHETER REMOVAL N/A 03/30/2017   Procedure: DIALYSIS/PERMA CATHETER REMOVAL;  Surgeon: Katha Cabal, MD;  Location: [facility];  Service: Cardiovascular;  Laterality: N/A;  . KIDNEY SURGERY  01/21/2016   IR NEPHROSTOMY PLACEMENT LEFT   . PERIPHERAL VASCULAR CATHETERIZATION N/A 04/07/2016   Procedure: Glori Luis Cath Insertion;  Surgeon: Katha Cabal, MD;  Location: [facility];  Service: Cardiovascular;  Laterality: N/A;  . PERIPHERAL VASCULAR THROMBECTOMY Left 06/02/2017   Procedure: PERIPHERAL VASCULAR THROMBECTOMY;  Surgeon: Algernon Huxley, MD;  Location: [facility];  Service: Cardiovascular;  Laterality: Left;  . PERIPHERAL VASCULAR THROMBECTOMY Left 01/28/2018   Procedure: PERIPHERAL VASCULAR THROMBECTOMY;  Surgeon: Katha Cabal, MD;  Location: [facility];  Service: Cardiovascular;  Laterality: Left;  . PERIPHERAL VASCULAR THROMBECTOMY Left 03/14/2018   Procedure: PERIPHERAL VASCULAR THROMBECTOMY;  Surgeon: Algernon Huxley, MD;  Location: [facility];  Service: Cardiovascular;  Laterality: Left;  . PERIPHERAL VASCULAR THROMBECTOMY Left 03/16/2018   Procedure: PERIPHERAL VASCULAR THROMBECTOMY;  Surgeon: Katha Cabal, MD;  Location: [facility];  Service: Cardiovascular;  Laterality: Left;  . PORTACATH PLACEMENT Right   . ROTATOR CUFF REPAIR Left   . TEMPORARY DIALYSIS CATHETER N/A 05/11/2018   Procedure: TEMPORARY DIALYSIS CATHETER;  Surgeon: Katha Cabal, MD;  Location: [facility];  Service: Cardiovascular;  Laterality: N/A;  . TEMPORARY DIALYSIS CATHETER Left 07/27/2018   Procedure: TEMPORARY DIALYSIS CATHETER;  Surgeon: Katha Cabal, MD;  Location: [facility];  Service: Cardiovascular;  Laterality: Left;  . TRANSURETHRAL RESECTION OF BLADDER TUMOR N/A 02/17/2016   Procedure:  TRANSURETHRAL RESECTION OF BLADDER TUMOR (TURBT)-LARGE;  Surgeon: Hollice Espy, MD;  Location: [facility];  Service: Urology;  Laterality: N/A;  . TRANSURETHRAL RESECTION OF BLADDER TUMOR N/A 10/07/2017   Procedure: TRANSURETHRAL RESECTION OF BLADDER TUMOR (TURBT)-small;  Surgeon: Hollice Espy, MD;  Location: [facility];  Service: Urology;  Laterality: N/A;  . URETEROSCOPY Left 02/17/2016   Procedure: URETEROSCOPY;  Surgeon: Hollice Espy, MD;  Location: [facility];  Service: Urology;  Laterality: Left;  . URETEROSCOPY Right 10/12/2016   Procedure: URETEROSCOPY;  Surgeon: Hollice Espy, MD;  Location: [facility];  Service: Urology;  Laterality: Right;       History of present illness and  Hospital Course:     Kindly see H&P for history of present illness and admission details, please review complete Labs, Consult reports and Test reports for all details in brief  HPI  from the history and physical done on the day of admission 78 year old female patient admitted for hyperkalemia.  Patient has AV fistula malformation came to vascular for declotting fistula but found to have severe hyperkalemia so procedure got canceled and she had emergency dialysis access placed and admitted to telemetry for severe hyperkalemia.   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 Hester Mates, 3.  COPD, no wheezing.  Continue home dose inhaler 4.  High-grade bladder cancer, radiation cystitis, patient follows with Dr. Erlene Quan, Dr. Rogue Bussing, in remission now. 5.  Diabetes mellitus type 2: Blood sugars controlled. #6 CAD:, no active chest pain.:   Monitor. 7.  Left hip pain, x-rays of the left hip show degenerative arthritis Discharge home with home health today after dialysis.    Discharge Condition: Stable   Follow UP  Follow-up Information    [name], MD. Schedule an appointment as soon as possible for a visit in 1 week(s).   Specialty:  Internal Medicine Contact information: [address] [place] [address] [phone]        Delana Meyer Dolores Lory, MD Follow up in 3 week(s).   Specialties:  Vascular Surgery, Cardiology, Radiology, Vascular Surgery Why:  with an HDA Contact information: [place] [address] [phone]             Discharge Instructions  and  Discharge Medications      Allergies as of 07/30/2018      Reactions   Glipizide Er Other (See Comments)   Hypoglycemia   Percocet [oxycodone-acetaminophen] Hives      Medication List    TAKE these medications   ACCU-CHEK AVIVA PLUS test strip Generic drug:  glucose blood 1 each by Other route daily. use as directed   glucose blood test strip USE AS DIRECTED ONCE DAILY   ACCU-CHEK AVIVA PLUS w/Device Kit (USE TO MONITOR BLOOD SUGARS.)   ACCU-CHEK SOFTCLIX LANCETS lancets INJECT 1 EACH AS DIRECTED ONCE DAILY.   acetaminophen 500 MG tablet Commonly known as:  TYLENOL Take 1,000 mg by mouth 2 (two) times daily as needed for moderate pain or headache.   albuterol 108 (90 Base) MCG/ACT inhaler Commonly known as:  PROVENTIL HFA;VENTOLIN HFA Inhale into the lungs.   atorvastatin 40 MG tablet Commonly known as:  LIPITOR Take 40 mg by mouth daily.   calcium acetate 667 MG capsule Commonly known as:  PHOSLO Take 2 capsules (1,334 mg total) by mouth 3 (three) times daily with meals.   cholecalciferol 1000 units tablet Commonly known as:  VITAMIN D Take 1,000 Units by mouth daily.   cinacalcet 30 MG tablet Commonly known as:  SENSIPAR Take 30 mg by mouth daily. With largest meal   esomeprazole 40 MG  capsule Commonly known as:  NEXIUM Take 40 mg by mouth daily after breakfast.   ferrous sulfate 325 (65 FE) MG EC tablet Take 325 mg by mouth daily after breakfast.   furosemide 40 MG tablet Commonly known as:  LASIX Take 40 mg by mouth 2 (two) times daily. Take morning dose every day. Lunch dose as needed.   gabapentin 300 MG capsule Commonly known as:  NEURONTIN Take 600 mg by mouth 3 (three) times daily.   lidocaine 5 % ointment Commonly known as:  XYLOCAINE Apply 1 application topically 2 (two) times daily as needed for mild pain.   lidocaine-prilocaine cream Commonly known as:  EMLA APPLY TO AFFECTED AREA ONCE   mirabegron ER 25 MG Tb24 tablet Commonly known as:  MYRBETRIQ Take by mouth.   NON FORMULARY Place 1 Units into the nose at bedtime. CPAP Time of use 2100-0600   oxybutynin 5 MG tablet Commonly known as:  DITROPAN Take 5 mg by mouth 3 (three) times daily.   OXYGEN Inhale 2 L into the lungs  at bedtime.   traMADol 50 MG tablet Commonly known as:  ULTRAM Take 50 mg by mouth every 12 (twelve) hours as needed.         Diet and Activity recommendation: See Discharge Instructions above   Consults obtained ;nephrology, vascular surgery Major procedures and Radiology Reports - PLEASE review detailed and final reports for all details, in brief -      Nm Pet Image Restag (ps) Skull Base To Thigh  Result Date: 07/25/2018 CLINICAL DATA:  Subsequent treatment strategy for bladder cancer. EXAM: NUCLEAR MEDICINE PET SKULL BASE TO THIGH TECHNIQUE: 10.12 mCi F-18 FDG was injected intravenously. Full-ring PET imaging was performed from the skull base to thigh after the radiotracer. CT data was obtained and used for attenuation correction and anatomic localization. Fasting blood glucose: 85 mg/dl COMPARISON:  CT cap 01/19/2018 FINDINGS: Mediastinal blood pool activity: SUV max 2.55 NECK: No hypermetabolic lymph nodes in the neck. Incidental CT findings: none CHEST: No  hypermetabolic axillary, supraclavicular, mediastinal, or hilar lymph nodes. Incidental CT findings: Cardiac enlargement with aortic atherosclerosis. Three vessel coronary artery atherosclerotic calcifications are identified. ABDOMEN/PELVIS: No abnormal radiotracer activity identified within the liver, pancreas, spleen, or adrenal glands. No hypermetabolic abdominal lymph nodes. Right common iliac lymph node measures 1.1 cm within SUV max 3.11. On CT from 01/19/2018 this lymph node measured 1.3 cm. On PET-CT from 12/22/2016 this measured 1.6 cm and had an SUV max of 2.8. Incidental CT findings: Persistent high-grade left-sided hydronephrosis and hydroureter. Recurrent right-sided hydronephrosis and hydroureter is identified. Marked diffuse bladder wall thickening is again identified. The bladder wall thickness measures up to 3.3 cm, image 215/3. Similar to 01/19/2018. SKELETON: No focal hypermetabolic activity to suggest skeletal metastasis. Incidental CT findings: none IMPRESSION: 1. Mild nonspecific FDG uptake is associated with the borderline enlarged index right common iliac lymph node referenced on study from 01/19/2018. This appears decreased in size when compared with 01/19/2018 and 01/01/2017. 2. No additional enlarged or hypermetabolic lymph nodes identified. No evidence for solid organ metastasis. 3. Marked bladder wall thickening. Persistent high-grade obstructive uropathy on the left. Recurrent right-sided hydronephrosis noted. 4.  Aortic Atherosclerosis (ICD10-I70.0). 5. Coronary artery atherosclerotic calcifications. Electronically Signed   By: Kerby Moors M.D.   On: 07/25/2018 12:06   Vas US Duplex Dialysis Access (avf, Avg)  Result Date: 06/30/2018 DIALYSIS ACCESS Access Site: Left Upper Extremity. Access Type: BrachAx AVG. History: 02/17/17: Left brachial-axillary AVG placement;          06/02/17: AVG thrombectomy with PTA of innominate vein and both          anastomoses; 05/12/2018  thrombectomy, PTA intervention of AVG          01/28/17: AVG thrombectomy with PTAs/stents of venous anastomosis and          the innominate/subclavian veins;          03/14/18: AVG thrombectomy with PTA of mid-distal AVG and both          anastomoses;          03/16/18: AVG thrombectomy with PTA of innominate vein and venous          outflow;. Comparison Study: 03/31/2018 Performing Technologist: Concha Norway RVT  Examination Guidelines: A complete evaluation includes B-mode imaging, spectral Doppler, color Doppler, and power Doppler as needed of all accessible portions of each vessel. Unilateral testing is considered an integral part of a complete examination. Limited examinations for reoccurring indications may be performed as noted.  Findings:  +---------------+----------+-------------+----------+--------+ OUTFLOW  VEIN   PSV (cm/s)Diameter (cm)Depth (cm)Describe +---------------+----------+-------------+----------+--------+ Subclavian vein   107                                    +---------------+----------+-------------+----------+--------+ Axillary vein      78                                    +---------------+----------+-------------+----------+--------+ AC Fossa           81                                    +---------------+----------+-------------+----------+--------+  +--------------------+----------+-----------------+--------+ AVG                 PSV (cm/s)Flow Vol (mL/min)Describe +--------------------+----------+-----------------+--------+ Native artery inflow   134          1254                +--------------------+----------+-----------------+--------+ Arterial anastomosis   134                              +--------------------+----------+-----------------+--------+ Prox graft             217                              +--------------------+----------+-----------------+--------+ Mid graft              317                               +--------------------+----------+-----------------+--------+ Distal graft           261                              +--------------------+----------+-----------------+--------+ Venous anastomosis     107                              +--------------------+----------+-----------------+--------+ Venous outflow         103                              +--------------------+----------+-----------------+--------+ +----------------+-------------+----------+---------+----------+---------------+                 Diameter (cm)Depth (cm)BranchingPSV (cm/s)  Flow Volume                                                                (ml/min)     +----------------+-------------+----------+---------+----------+---------------+ Left Rad art                                        20  distal                                                                    +----------------+-------------+----------+---------+----------+---------------+ Antegrade                                                                 +----------------+-------------+----------+---------+----------+---------------+  Summary: Patent AVG with improved velocities at the proximal graft s/p PTA and thrombectomy. Flow volume has increased compared to previous study. Mid graft shows mild stenosis and increased velocities due to irregular wall and moderate thrombus from mid to distal AVG and proximal outflow vein.  *See table(s) above for measurements and observations.  Diagnosing physician: [name] MD Electronically signed by [name] MD on 06/30/2018 at 5:13:26 PM.   --------------------------------------------------------------------------------   Final    Dg Hip Unilat With Pelvis 2-3 Views Left  Result Date: 07/28/2018 CLINICAL DATA:  Left hip pain EXAM: DG HIP (WITH OR WITHOUT PELVIS) 2-3V LEFT COMPARISON:  None. FINDINGS: Moderate symmetric degenerative changes in the hips  with joint space narrowing and spurring. SI joints symmetric and unremarkable. No acute bony abnormality. Specifically, no fracture, subluxation, or dislocation. Right groin dialysis catheter in place. The tips project off the superior aspect of the image. IMPRESSION: Bilateral symmetric moderate degenerative changes in the hips. No acute bony abnormality. Electronically Signed   By: Rolm Baptise M.D.   On: 07/28/2018 10:04    Micro Results     Recent Results (from the past 240 hour(s))  MRSA PCR Screening     Status: None   Collection Time: 07/27/18  7:01 PM  Result Value Ref Range Status   MRSA by PCR NEGATIVE NEGATIVE Final    Comment:        The GeneXpert MRSA Assay (FDA approved for NASAL specimens only), is one component of a comprehensive MRSA colonization surveillance program. It is not intended to diagnose MRSA infection nor to guide or monitor treatment for MRSA infections. Performed at [facility], [address] [place], [place] [address]        Today   Subjective:   Barbara [name] stable for discharge  Objective:   Blood pressure (!) 134/55, pulse 77, temperature 99.9 F (37.7 C), temperature source Oral, resp. rate 20, height 5' 8"  (1.727 m), weight 78.9 kg, SpO2 91 %.   Intake/Output Summary (Last 24 hours) at 07/30/2018 1110 Last data filed at 07/30/2018 0300 Gross per 24 hour  Intake -  Output 0 ml  Net 0 ml    Exam Awake Alert, Oriented x 3, No new F.N deficits, Normal affect [place].AT,PERRAL Supple Neck,No JVD, No cervical lymphadenopathy appriciated.  Symmetrical Chest wall movement, Good air movement bilaterally, CTAB RRR,No Gallops,Rubs or new Murmurs, No Parasternal Heave +ve B.Sounds, Abd Soft, Non tender, No organomegaly appriciated, No rebound -guarding or rigidity. No Cyanosis, Clubbing or edema, No new Rash or bruise  Data Review   CBC w Diff:  Lab Results  Component Value Date   WBC 5.2 07/28/2018   HGB 11.5 (L)  07/28/2018  HGB 10.2 (L) 08/07/2012   HCT 37.3 07/28/2018   HCT 30.7 (L) 08/07/2012   PLT 163 07/28/2018   PLT 294 08/07/2012   LYMPHOPCT 22 07/27/2018   LYMPHOPCT 15.7 08/07/2012   MONOPCT 8 07/27/2018   MONOPCT 3.9 08/07/2012   EOSPCT 3 07/27/2018   EOSPCT 0.0 08/07/2012   BASOPCT 1 07/27/2018   BASOPCT 0.1 08/07/2012    CMP:  Lab Results  Component Value Date   NA 139 07/28/2018   NA 138 11/04/2016   NA 138 08/07/2012   K 3.9 07/28/2018   K 3.2 (L) 10/30/2014   CL 104 07/28/2018   CL 104 08/07/2012   CO2 25 07/28/2018   CO2 25 08/07/2012   BUN 40 (H) 07/28/2018   BUN 69 (H) 11/04/2016   BUN 63 (H) 08/07/2012   CREATININE 9.23 (H) 07/28/2018   CREATININE 2.48 (H) 08/07/2012   PROT 8.1 07/27/2018   PROT 8.9 (H) 08/02/2012   ALBUMIN 4.0 07/27/2018   ALBUMIN 3.9 08/02/2012   BILITOT 0.6 07/27/2018   BILITOT 0.3 08/02/2012   ALKPHOS 133 (H) 07/27/2018   ALKPHOS 107 08/02/2012   AST 15 07/27/2018   AST 21 08/02/2012   ALT 8 07/27/2018   ALT 19 08/02/2012  .   Total Time in preparing paper work, data evaluation and todays exam - 35 minutes  Epifanio Lesches M.D on 07/30/2018 at 11:10 AM    Note: This dictation was prepared with Dragon dictation along with smaller phrase technology. Any transcriptional errors that result from this process are unintentional.

## 2018-07-30 NOTE — Progress Notes (Signed)
Patient is being discharge hame as per order, discharge instruction provided, dialysis catheter groin removed, site dry no bleeding noted

## 2018-07-30 NOTE — Progress Notes (Signed)
Patient did not go home yesterday because of elevated blood pressure, patient also still being in dialysis unit until 8 PM.  Today morning she feels much better, patient also is 152/58.  Stable for discharge.

## 2018-08-01 ENCOUNTER — Encounter: Payer: Self-pay | Admitting: Vascular Surgery

## 2018-08-11 ENCOUNTER — Other Ambulatory Visit: Payer: Self-pay | Admitting: Nurse Practitioner

## 2018-09-05 ENCOUNTER — Other Ambulatory Visit: Payer: Self-pay | Admitting: Internal Medicine

## 2018-09-05 DIAGNOSIS — Z1231 Encounter for screening mammogram for malignant neoplasm of breast: Secondary | ICD-10-CM

## 2018-09-15 ENCOUNTER — Ambulatory Visit (INDEPENDENT_AMBULATORY_CARE_PROVIDER_SITE_OTHER): Payer: Medicare Other | Admitting: Podiatry

## 2018-09-15 ENCOUNTER — Encounter: Payer: Self-pay | Admitting: Podiatry

## 2018-09-15 DIAGNOSIS — M2012 Hallux valgus (acquired), left foot: Secondary | ICD-10-CM | POA: Diagnosis not present

## 2018-09-15 DIAGNOSIS — M2041 Other hammer toe(s) (acquired), right foot: Secondary | ICD-10-CM

## 2018-09-15 DIAGNOSIS — B351 Tinea unguium: Secondary | ICD-10-CM

## 2018-09-15 DIAGNOSIS — M79674 Pain in right toe(s): Principal | ICD-10-CM

## 2018-09-15 DIAGNOSIS — E1142 Type 2 diabetes mellitus with diabetic polyneuropathy: Secondary | ICD-10-CM | POA: Diagnosis not present

## 2018-09-15 DIAGNOSIS — M79675 Pain in left toe(s): Principal | ICD-10-CM

## 2018-09-15 DIAGNOSIS — M79676 Pain in unspecified toe(s): Secondary | ICD-10-CM

## 2018-09-15 DIAGNOSIS — M2011 Hallux valgus (acquired), right foot: Secondary | ICD-10-CM

## 2018-09-15 NOTE — Progress Notes (Signed)
Complaint:  Visit Type: Patient returns to my office for continued preventative foot care services. Complaint: Patient states" my nails have grown long and thick and become painful to walk and wear shoes" Patient has been diagnosed with DM with no foot complications. The patient presents for preventative foot care services. No changes to ROS  Podiatric Exam: Vascular: dorsalis pedis and posterior tibial pulses are palpable bilateral. Capillary return is immediate. Temperature gradient is WNL. Skin turgor WNL  Sensorium: Diminished  Semmes Weinstein monofilament test. Normal tactile sensation bilaterally. Nail Exam: Pt has thick disfigured discolored nails with subungual debris noted bilateral entire nail hallux through fifth toenails Ulcer Exam: There is no evidence of ulcer or pre-ulcerative changes or infection. Orthopedic Exam: Muscle tone and strength are WNL. No limitations in general ROM. No crepitus or effusions noted. Foot type and digits show no abnormalities. HAV  B/L.  Hammer toe 2 right. Skin: No Porokeratosis. No infection or ulcers  Diagnosis:  Onychomycosis, , Pain in right toe, pain in left toes  Treatment & Plan Procedures and Treatment: Consent by patient was obtained for treatment procedures.   Debridement of mycotic and hypertrophic toenails, 1 through 5 bilateral and clearing of subungual debris. No ulceration, no infection noted. Patient qualifies for diabetic shoes due to DPN, HAV and hammer toe.Marland Kitchen  Dispense diabetic shoes.  Patient presents today and was dispensed 0ne pair ( two units) of medically necessary extra depth shoes with three pair( six units) of custom molded multiple density inserts. The shoes and the inserts are fitted to the patients ' feet and are noted to fit well and are free of defect.  Length and width of the shoes are also acceptable.  Patient was given written and verbal  instructions for wearing.  If any concerns arrive with the shoes or inserts, the  patient is to call the office.Patient is to follow up with doctor in six weeks. Return Visit-Office Procedure: Patient instructed to return to the office for a follow up visit 3 months for continued evaluation and treatment.    Gardiner Barefoot DPM

## 2018-09-27 ENCOUNTER — Other Ambulatory Visit: Payer: Self-pay

## 2018-09-27 DIAGNOSIS — D631 Anemia in chronic kidney disease: Secondary | ICD-10-CM

## 2018-09-27 DIAGNOSIS — C670 Malignant neoplasm of trigone of bladder: Secondary | ICD-10-CM

## 2018-09-27 DIAGNOSIS — N189 Chronic kidney disease, unspecified: Principal | ICD-10-CM

## 2018-09-28 ENCOUNTER — Inpatient Hospital Stay: Payer: Medicare Other | Attending: Internal Medicine

## 2018-09-28 ENCOUNTER — Other Ambulatory Visit: Payer: Self-pay

## 2018-09-28 DIAGNOSIS — D631 Anemia in chronic kidney disease: Secondary | ICD-10-CM | POA: Diagnosis not present

## 2018-09-28 DIAGNOSIS — Z992 Dependence on renal dialysis: Secondary | ICD-10-CM | POA: Insufficient documentation

## 2018-09-28 DIAGNOSIS — Z95828 Presence of other vascular implants and grafts: Secondary | ICD-10-CM

## 2018-09-28 DIAGNOSIS — I120 Hypertensive chronic kidney disease with stage 5 chronic kidney disease or end stage renal disease: Secondary | ICD-10-CM | POA: Diagnosis not present

## 2018-09-28 DIAGNOSIS — N186 End stage renal disease: Secondary | ICD-10-CM | POA: Insufficient documentation

## 2018-09-28 DIAGNOSIS — Z87891 Personal history of nicotine dependence: Secondary | ICD-10-CM | POA: Diagnosis not present

## 2018-09-28 DIAGNOSIS — E1122 Type 2 diabetes mellitus with diabetic chronic kidney disease: Secondary | ICD-10-CM | POA: Diagnosis not present

## 2018-09-28 DIAGNOSIS — Z452 Encounter for adjustment and management of vascular access device: Secondary | ICD-10-CM | POA: Diagnosis not present

## 2018-09-28 DIAGNOSIS — C670 Malignant neoplasm of trigone of bladder: Secondary | ICD-10-CM | POA: Diagnosis not present

## 2018-09-28 MED ORDER — SODIUM CHLORIDE 0.9% FLUSH
10.0000 mL | Freq: Once | INTRAVENOUS | Status: AC
  Administered 2018-09-28: 10 mL via INTRAVENOUS
  Filled 2018-09-28: qty 10

## 2018-09-28 MED ORDER — HEPARIN SOD (PORK) LOCK FLUSH 100 UNIT/ML IV SOLN
500.0000 [IU] | Freq: Once | INTRAVENOUS | Status: AC
  Administered 2018-09-28: 500 [IU] via INTRAVENOUS

## 2018-11-29 ENCOUNTER — Other Ambulatory Visit: Payer: Self-pay

## 2018-11-30 ENCOUNTER — Inpatient Hospital Stay: Payer: Medicare Other

## 2018-12-15 ENCOUNTER — Other Ambulatory Visit: Payer: Self-pay

## 2018-12-15 ENCOUNTER — Ambulatory Visit (INDEPENDENT_AMBULATORY_CARE_PROVIDER_SITE_OTHER): Payer: Medicare Other | Admitting: Podiatry

## 2018-12-15 ENCOUNTER — Encounter: Payer: Self-pay | Admitting: Podiatry

## 2018-12-15 VITALS — Temp 97.2°F

## 2018-12-15 DIAGNOSIS — B351 Tinea unguium: Secondary | ICD-10-CM | POA: Diagnosis not present

## 2018-12-15 DIAGNOSIS — E1142 Type 2 diabetes mellitus with diabetic polyneuropathy: Secondary | ICD-10-CM

## 2018-12-15 DIAGNOSIS — M79675 Pain in left toe(s): Secondary | ICD-10-CM

## 2018-12-15 DIAGNOSIS — M79674 Pain in right toe(s): Secondary | ICD-10-CM

## 2018-12-15 NOTE — Progress Notes (Signed)
Complaint:  Visit Type: Patient returns to my office for continued preventative foot care services. Complaint: Patient states" my nails have grown long and thick and become painful to walk and wear shoes" Patient has been diagnosed with DM with no foot complications. The patient presents for preventative foot care services. No changes to ROS  Podiatric Exam: Vascular: dorsalis pedis and posterior tibial pulses are palpable bilateral. Capillary return is immediate. Temperature gradient is WNL. Skin turgor WNL  Sensorium: Diminished  Semmes Weinstein monofilament test. Normal tactile sensation bilaterally. Nail Exam: Pt has thick disfigured discolored nails with subungual debris noted bilateral entire nail hallux through fifth toenails Ulcer Exam: There is no evidence of ulcer or pre-ulcerative changes or infection. Orthopedic Exam: Muscle tone and strength are WNL. No limitations in general ROM. No crepitus or effusions noted. Foot type and digits show no abnormalities. Bony prominences are unremarkable. Skin: No Porokeratosis. No infection or ulcers  Diagnosis:  Onychomycosis, , Pain in right toe, pain in left toes  Treatment & Plan Procedures and Treatment: Consent by patient was obtained for treatment procedures.   Debridement of mycotic and hypertrophic toenails, 1 through 5 bilateral and clearing of subungual debris. No ulceration, no infection noted.  Return Visit-Office Procedure: Patient instructed to return to the office for a follow up visit 3 months for continued evaluation and treatment.    Gardiner Barefoot DPM

## 2018-12-21 ENCOUNTER — Other Ambulatory Visit: Payer: Self-pay

## 2018-12-22 ENCOUNTER — Other Ambulatory Visit: Payer: Self-pay

## 2018-12-22 ENCOUNTER — Inpatient Hospital Stay: Payer: Medicare Other | Attending: Internal Medicine

## 2018-12-22 DIAGNOSIS — I120 Hypertensive chronic kidney disease with stage 5 chronic kidney disease or end stage renal disease: Secondary | ICD-10-CM | POA: Diagnosis not present

## 2018-12-22 DIAGNOSIS — Z87891 Personal history of nicotine dependence: Secondary | ICD-10-CM | POA: Diagnosis not present

## 2018-12-22 DIAGNOSIS — Z992 Dependence on renal dialysis: Secondary | ICD-10-CM | POA: Insufficient documentation

## 2018-12-22 DIAGNOSIS — N186 End stage renal disease: Secondary | ICD-10-CM | POA: Diagnosis not present

## 2018-12-22 DIAGNOSIS — Z452 Encounter for adjustment and management of vascular access device: Secondary | ICD-10-CM | POA: Insufficient documentation

## 2018-12-22 DIAGNOSIS — E1122 Type 2 diabetes mellitus with diabetic chronic kidney disease: Secondary | ICD-10-CM | POA: Insufficient documentation

## 2018-12-22 DIAGNOSIS — D631 Anemia in chronic kidney disease: Secondary | ICD-10-CM | POA: Diagnosis not present

## 2018-12-22 DIAGNOSIS — C670 Malignant neoplasm of trigone of bladder: Secondary | ICD-10-CM | POA: Diagnosis not present

## 2018-12-22 DIAGNOSIS — Z95828 Presence of other vascular implants and grafts: Secondary | ICD-10-CM

## 2018-12-22 MED ORDER — SODIUM CHLORIDE 0.9% FLUSH
10.0000 mL | Freq: Once | INTRAVENOUS | Status: AC
  Administered 2018-12-22: 10 mL via INTRAVENOUS
  Filled 2018-12-22: qty 10

## 2018-12-22 MED ORDER — HEPARIN SOD (PORK) LOCK FLUSH 100 UNIT/ML IV SOLN
500.0000 [IU] | Freq: Once | INTRAVENOUS | Status: AC
  Administered 2018-12-22: 14:00:00 500 [IU] via INTRAVENOUS

## 2018-12-27 ENCOUNTER — Other Ambulatory Visit: Payer: Medicare Other | Admitting: Urology

## 2019-01-05 ENCOUNTER — Other Ambulatory Visit: Payer: Self-pay

## 2019-01-05 ENCOUNTER — Ambulatory Visit (INDEPENDENT_AMBULATORY_CARE_PROVIDER_SITE_OTHER): Payer: Medicare Other

## 2019-01-05 ENCOUNTER — Ambulatory Visit (INDEPENDENT_AMBULATORY_CARE_PROVIDER_SITE_OTHER): Payer: Medicare Other | Admitting: Vascular Surgery

## 2019-01-05 ENCOUNTER — Encounter (INDEPENDENT_AMBULATORY_CARE_PROVIDER_SITE_OTHER): Payer: Self-pay | Admitting: Vascular Surgery

## 2019-01-05 VITALS — BP 145/63 | HR 82 | Resp 16 | Wt 182.8 lb

## 2019-01-05 DIAGNOSIS — I120 Hypertensive chronic kidney disease with stage 5 chronic kidney disease or end stage renal disease: Secondary | ICD-10-CM | POA: Diagnosis not present

## 2019-01-05 DIAGNOSIS — T829XXS Unspecified complication of cardiac and vascular prosthetic device, implant and graft, sequela: Secondary | ICD-10-CM

## 2019-01-05 DIAGNOSIS — Z79899 Other long term (current) drug therapy: Secondary | ICD-10-CM

## 2019-01-05 DIAGNOSIS — I10 Essential (primary) hypertension: Secondary | ICD-10-CM

## 2019-01-05 DIAGNOSIS — Z9889 Other specified postprocedural states: Secondary | ICD-10-CM

## 2019-01-05 DIAGNOSIS — Z992 Dependence on renal dialysis: Secondary | ICD-10-CM

## 2019-01-05 DIAGNOSIS — J449 Chronic obstructive pulmonary disease, unspecified: Secondary | ICD-10-CM | POA: Diagnosis not present

## 2019-01-05 DIAGNOSIS — N186 End stage renal disease: Secondary | ICD-10-CM

## 2019-01-05 DIAGNOSIS — E1122 Type 2 diabetes mellitus with diabetic chronic kidney disease: Secondary | ICD-10-CM | POA: Diagnosis not present

## 2019-01-05 NOTE — Progress Notes (Signed)
MRN : [id number]  Barbara [name] is a 78 y.o. (1940/12/18) female who presents with chief complaint of  Chief Complaint  Patient presents with   Follow-up    31monthHDA  .  History of Present Illness:   The patient returns to the office for followup status post intervention of the dialysis access 07/29/2018. Following the intervention the access function has significantly improved, with better flow rates and improved KT/V. The patient has not been experiencing increased bleeding times following decannulation and the patient denies increased recirculation. The patient denies an increase in arm swelling. At the present time the patient denies hand pain.  The patient denies amaurosis fugax or recent TIA symptoms. There are no recent neurological changes noted. The patient denies claudication symptoms or rest pain symptoms. The patient denies history of DVT, PE or superficial thrombophlebitis. The patient denies recent episodes of angina or shortness of breath.    Duplex ultrasound of the AV access shows a patent access.  The previously noted stenosis is unchanged compared to last study.    Current Meds  Medication Sig   ACCU-CHEK AVIVA PLUS test strip 1 each by Other route daily. use as directed   ACCU-CHEK SOFTCLIX LANCETS lancets INJECT 1 EACH AS DIRECTED ONCE DAILY.   acetaminophen (TYLENOL) 500 MG tablet Take 1,000 mg by mouth 2 (two) times daily as needed for moderate pain or headache.    albuterol (PROVENTIL HFA;VENTOLIN HFA) 108 (90 Base) MCG/ACT inhaler Inhale into the lungs.   atorvastatin (LIPITOR) 40 MG tablet Take 40 mg by mouth daily.    Blood Glucose Monitoring Suppl (ACCU-CHEK AVIVA PLUS) w/Device KIT (USE TO MONITOR BLOOD SUGARS.)   calcium acetate (PHOSLO) 667 MG capsule Take 2 capsules (1,334 mg total) by mouth 3 (three) times daily with meals.   cholecalciferol (VITAMIN D) 1000 units tablet Take 1,000 Units by mouth daily.   cinacalcet (SENSIPAR) 30 MG  tablet Take 30 mg by mouth daily. With largest meal   esomeprazole (NEXIUM) 40 MG capsule Take 40 mg by mouth daily after breakfast.    ferrous sulfate 325 (65 FE) MG EC tablet Take 325 mg by mouth daily after breakfast.    furosemide (LASIX) 40 MG tablet Take 40 mg by mouth 2 (two) times daily. Take morning dose every day. Lunch dose as needed.   gabapentin (NEURONTIN) 300 MG capsule Take 600 mg by mouth 3 (three) times daily.   glucose blood test strip USE AS DIRECTED ONCE DAILY   ibuprofen (ADVIL) 200 MG tablet Take 200 mg by mouth every 6 (six) hours as needed.   levofloxacin (LEVAQUIN) 250 MG tablet TAKE 1 TABLET (250 MG TOTAL) BY MOUTH EVERY OTHER DAY FOR 3 DOSES   lidocaine (XYLOCAINE) 5 % ointment Apply 1 application topically 2 (two) times daily as needed for mild pain.    lidocaine-prilocaine (EMLA) cream APPLY TO AFFECTED AREA ONCE   mirabegron ER (MYRBETRIQ) 25 MG TB24 tablet Take by mouth.   NON FORMULARY Place 1 Units into the nose at bedtime. CPAP Time of use 2100-0600   oxybutynin (DITROPAN) 5 MG tablet Take 5 mg by mouth 3 (three) times daily.   OXYGEN Inhale 2 L into the lungs at bedtime.    Past Medical History:  Diagnosis Date   Adult celiac disease    Anemia associated with chronic renal failure 2017   blood transfusion last week 10/17   Arthritis    Asthma    [place]   Chronic kidney disease    CKD (chronic kidney disease)    stage IV kidney disease.  dr. Candiss Norse and dr. Holley Raring follow her   COPD (chronic obstructive pulmonary disease) ([place])    Coronary artery disease    Diabetes mellitus without complication [facility])    Dialysis patient ([place])    Tues, Thurs, Sat   Dyspnea    with exertion   Elevated lipids    GERD (gastroesophageal reflux disease)    Hematuria    Hypertension    Lower back pain    Neuropathy    Oxygen dependent    at hs   Personal history of chemotherapy 2017   bladder ca   Personal  history of radiation therapy 2017   bladder ca   Sleep apnea    uses cpap   Urinary obstruction 01/2016    Past Surgical History:  Procedure Laterality Date   A/V SHUNT INTERVENTION N/A 05/12/2018   Procedure: A/V SHUNT INTERVENTION;  Surgeon: Algernon Huxley, MD;  Location: [place] [facility];  Service: Cardiovascular;  Laterality: N/A;   A/V SHUNT INTERVENTION Left 07/29/2018   Procedure: A/V SHUNT INTERVENTION;  Surgeon: Katha Cabal, MD;  Location: [place] [facility];  Service: Cardiovascular;  Laterality: Left;   ABDOMINAL HYSTERECTOMY     AV FISTULA PLACEMENT Left 02/17/2017   Procedure: INSERTION OF ARTERIOVENOUS (AV) GORE-TEX GRAFT ARM(BRACHIAL AXILLARY);  Surgeon: Katha Cabal, MD;  Location: [facility];  Service: Vascular;  Laterality: Left;   CYSTOSCOPY W/ RETROGRADES Bilateral 02/17/2016   Procedure: CYSTOSCOPY WITH RETROGRADE PYELOGRAM;  Surgeon: Hollice Espy, MD;  Location: [facility];  Service: Urology;  Laterality: Bilateral;   CYSTOSCOPY W/ RETROGRADES Bilateral 10/12/2016   Procedure: CYSTOSCOPY WITH RETROGRADE PYELOGRAM;  Surgeon: Hollice Espy, MD;  Location: [facility];  Service: Urology;  Laterality: Bilateral;   CYSTOSCOPY W/ RETROGRADES Bilateral 06/09/2017   Procedure: CYSTOSCOPY WITH RETROGRADE PYELOGRAM;  Surgeon: Hollice Espy, MD;  Location: [facility];  Service: Urology;  Laterality: Bilateral;   CYSTOSCOPY W/ RETROGRADES Bilateral 10/07/2017   Procedure: CYSTOSCOPY WITH RETROGRADE PYELOGRAM;  Surgeon: Hollice Espy, MD;  Location: [facility];  Service: Urology;  Laterality: Bilateral;   CYSTOSCOPY W/ URETERAL STENT PLACEMENT Left 05/12/2016   Procedure: CYSTOSCOPY WITH STENT REPLACEMENT;  Surgeon: Hollice Espy, MD;  Location: [facility];  Service: Urology;  Laterality: Left;   CYSTOSCOPY W/ URETERAL STENT PLACEMENT Left 10/12/2016   Procedure: CYSTOSCOPY WITH STENT REPLACEMENT;  Surgeon: Hollice Espy, MD;  Location: [facility];  Service:  Urology;  Laterality: Left;   CYSTOSCOPY WITH BIOPSY N/A 06/09/2017   Procedure: CYSTOSCOPY WITH BLADDER BIOPSY;  Surgeon: Hollice Espy, MD;  Location: [facility];  Service: Urology;  Laterality: N/A;   CYSTOSCOPY WITH STENT PLACEMENT Left 01/21/2016   Procedure: CYSTOSCOPY WITH double J STENT PLACEMENT;  Surgeon: Franchot Gallo, MD;  Location: [facility];  Service: Urology;  Laterality: Left;   CYSTOSCOPY WITH STENT PLACEMENT Right 10/12/2016   Procedure: CYSTOSCOPY WITH STENT PLACEMENT;  Surgeon: Hollice Espy, MD;  Location: [facility];  Service: Urology;  Laterality: Right;   DIALYSIS/PERMA CATHETER INSERTION N/A 11/20/2016   Procedure: Dialysis/Perma Catheter Insertion;  Surgeon: Algernon Huxley, MD;  Location: [place] [facility];  Service: Cardiovascular;  Laterality: N/A;   DIALYSIS/PERMA CATHETER REMOVAL N/A 03/30/2017   Procedure: DIALYSIS/PERMA CATHETER REMOVAL;  Surgeon: Katha Cabal, MD;  Location: [place] [facility];  Service: Cardiovascular;  Laterality: N/A;   KIDNEY SURGERY  01/21/2016   IR  NEPHROSTOMY PLACEMENT LEFT    PERIPHERAL VASCULAR CATHETERIZATION N/A 04/07/2016   Procedure: Glori Luis Cath Insertion;  Surgeon: Katha Cabal, MD;  Location: [place] [facility];  Service: Cardiovascular;  Laterality: N/A;   PERIPHERAL VASCULAR THROMBECTOMY Left 06/02/2017   Procedure: PERIPHERAL VASCULAR THROMBECTOMY;  Surgeon: Algernon Huxley, MD;  Location: [place] [facility];  Service: Cardiovascular;  Laterality: Left;   PERIPHERAL VASCULAR THROMBECTOMY Left 01/28/2018   Procedure: PERIPHERAL VASCULAR THROMBECTOMY;  Surgeon: Katha Cabal, MD;  Location: [place] [facility];  Service: Cardiovascular;  Laterality: Left;   PERIPHERAL VASCULAR THROMBECTOMY Left 03/14/2018   Procedure: PERIPHERAL VASCULAR THROMBECTOMY;  Surgeon: Algernon Huxley, MD;  Location: [place] [facility];  Service: Cardiovascular;  Laterality: Left;   PERIPHERAL VASCULAR THROMBECTOMY Left 03/16/2018    Procedure: PERIPHERAL VASCULAR THROMBECTOMY;  Surgeon: Katha Cabal, MD;  Location: [place] [facility];  Service: Cardiovascular;  Laterality: Left;   PORTACATH PLACEMENT Right    ROTATOR CUFF REPAIR Left    TEMPORARY DIALYSIS CATHETER N/A 05/11/2018   Procedure: TEMPORARY DIALYSIS CATHETER;  Surgeon: Katha Cabal, MD;  Location: [place] [facility];  Service: Cardiovascular;  Laterality: N/A;   TEMPORARY DIALYSIS CATHETER Left 07/27/2018   Procedure: TEMPORARY DIALYSIS CATHETER;  Surgeon: Katha Cabal, MD;  Location: [place] [facility];  Service: Cardiovascular;  Laterality: Left;   TRANSURETHRAL RESECTION OF BLADDER TUMOR N/A 02/17/2016   Procedure: TRANSURETHRAL RESECTION OF BLADDER TUMOR (TURBT)-LARGE;  Surgeon: Hollice Espy, MD;  Location: [facility];  Service: Urology;  Laterality: N/A;   TRANSURETHRAL RESECTION OF BLADDER TUMOR N/A 10/07/2017   Procedure: TRANSURETHRAL RESECTION OF BLADDER TUMOR (TURBT)-small;  Surgeon: Hollice Espy, MD;  Location: [facility];  Service: Urology;  Laterality: N/A;   URETEROSCOPY Left 02/17/2016   Procedure: URETEROSCOPY;  Surgeon: Hollice Espy, MD;  Location: [facility];  Service: Urology;  Laterality: Left;   URETEROSCOPY Right 10/12/2016   Procedure: URETEROSCOPY;  Surgeon: Hollice Espy, MD;  Location: [facility];  Service: Urology;  Laterality: Right;    Social History Social History   Tobacco Use   Smoking status: Former Smoker    Types: Cigarettes    Quit date: 07/20/2002    Years since quitting: 16.4   Smokeless tobacco: Never Used   Tobacco comment: 01/22/2016   "  quit smoking many years ago "  Substance Use Topics   Alcohol use: No   Drug use: No    Family History Family History  Problem Relation Age of Onset   Diabetes Mother    Cancer Father    Breast cancer Neg Hx    Kidney disease Neg Hx     Allergies  Allergen Reactions   Glipizide Er Other (See Comments)    Hypoglycemia    Percocet  [Oxycodone-Acetaminophen] Hives     REVIEW OF SYSTEMS (Negative unless checked)  Constitutional: []Weight loss  []Fever  []Chills Cardiac: []Chest pain   []Chest pressure   []Palpitations   []Shortness of breath when laying flat   []Shortness of breath with exertion. Vascular:  []Pain in legs with walking   []Pain in legs at rest  []History of DVT   []Phlebitis   []Swelling in legs   []Varicose veins   []Non-healing ulcers Pulmonary:   []Uses home oxygen   []Productive cough   []Hemoptysis   []Wheeze  [x]COPD   []Asthma Neurologic:  []Dizziness   []Seizures   []History of stroke   []History of TIA  []Aphasia   []Vissual  changes   []Weakness or numbness in arm   []Weakness or numbness in leg Musculoskeletal:   []Joint swelling   []Joint pain   []Low back pain Hematologic:  []Easy bruising  []Easy bleeding   []Hypercoagulable state   []Anemic Gastrointestinal:  []Diarrhea   []Vomiting  [x]Gastroesophageal reflux/heartburn   []Difficulty swallowing. Genitourinary:  [x]Chronic kidney disease   []Difficult urination  []Frequent urination   []Blood in urine Skin:  []Rashes   []Ulcers  Psychological:  []History of anxiety   [] History of major depression.  Physical Examination  Vitals:   01/05/19 1009  BP: (!) 145/63  Pulse: 82  Resp: 16  Weight: 182 lb 12.8 oz (82.9 kg)   Body mass index is 27.79 kg/m. Gen: WD/WN, NAD Head: [place]/AT, No temporalis wasting.  Ear/Nose/Throat: Hearing grossly intact, nares w/o erythema or drainage Eyes: PER, EOMI, sclera nonicteric.  Neck: Supple, no large masses.   Pulmonary:  Good air movement, no audible wheezing bilaterally, no use of accessory muscles.  Cardiac: RRR, no JVD Vascular:  Left arm AV graft good thrill good bruit skin appears healthy Vessel Right Left  Radial Palpable Palpable  Brachial Palpable Palpable  Carotid Palpable Palpable  Gastrointestinal: Non-distended. No guarding/no peritoneal signs.  Musculoskeletal: M/S 5/5 throughout.   No deformity or atrophy.  Neurologic: CN 2-12 intact. Symmetrical.  Speech is fluent. Motor exam as listed above. Psychiatric: Judgment intact, Mood & affect appropriate for pt's clinical situation. Dermatologic: No rashes or ulcers noted.  No changes consistent with cellulitis. Lymph : No lichenification or skin changes of chronic lymphedema.  CBC Lab Results  Component Value Date   WBC 5.2 07/28/2018   HGB 11.5 (L) 07/28/2018   HCT 37.3 07/28/2018   MCV 97.4 07/28/2018   PLT 163 07/28/2018    BMET    Component Value Date/Time   NA 139 07/28/2018 0803   NA 138 11/04/2016 1453   NA 138 08/07/2012 0433   K 3.9 07/28/2018 0803   K 3.2 (L) 10/30/2014 1410   CL 104 07/28/2018 0803   CL 104 08/07/2012 0433   CO2 25 07/28/2018 0803   CO2 25 08/07/2012 0433   GLUCOSE 101 (H) 07/28/2018 0803   GLUCOSE 172 (H) 08/07/2012 0433   BUN 40 (H) 07/28/2018 0803   BUN 69 (H) 11/04/2016 1453   BUN 63 (H) 08/07/2012 0433   CREATININE 9.23 (H) 07/28/2018 0803   CREATININE 2.48 (H) 08/07/2012 0433   CALCIUM 8.0 (L) 07/28/2018 0803   CALCIUM 9.1 08/07/2012 0433   GFRNONAA 4 (L) 07/28/2018 0803   GFRNONAA 19 (L) 08/07/2012 0433   GFRAA 4 (L) 07/28/2018 0803   GFRAA 22 (L) 08/07/2012 0433   CrCl cannot be calculated (Patient's most recent lab result is older than the maximum 21 days allowed.).  COAG Lab Results  Component Value Date   INR 1.08 02/08/2017   INR 1.23 01/25/2016   INR 1.38 01/24/2016    Radiology Vas US Duplex Dialysis Access (avf, Avg)  Result Date: 01/05/2019 DIALYSIS ACCESS Access Site: Left Upper Extremity. Access Type: Brach Ax. History: 02/17/17: Left brachial-axillary AVG placement;          06/02/17: AVG thrombectomy with PTA of innominate vein and both          anastomoses; 05/12/2018 thrombectomy, PTA intervention of AVG          01/28/17: AVG thrombectomy with PTAs/stents of venous anastomosis and          the innominate/subclavian veins;  03/14/18: AVG  thrombectomy with PTA of mid-distal AVG and both          anastomoses;          03/16/18: AVG thrombectomy with PTA of innominate vein and venous          outflow;          07/29/2018 Thrombectomy of AVG; stent to central venous. Comparison Study: 06/30/2018 Performing Technologist: Concha Norway RVT  Examination Guidelines: A complete evaluation includes B-mode imaging, spectral Doppler, color Doppler, and power Doppler as needed of all accessible portions of each vessel. Unilateral testing is considered an integral part of a complete examination. Limited examinations for reoccurring indications may be performed as noted.  Findings:  +---------------+----------+-------------+----------+--------+  OUTFLOW VEIN    PSV (cm/s) Diameter (cm) Depth (cm) Describe  +---------------+----------+-------------+----------+--------+  Subclavian vein    135                                        +---------------+----------+-------------+----------+--------+  Axillary vein       78                                        +---------------+----------+-------------+----------+--------+  +--------------------+----------+-----------------+----------------------------+  AVG                  PSV (cm/s) Flow Vol (mL/min)           Describe            +--------------------+----------+-----------------+----------------------------+  Native artery inflow    109           1243                                      +--------------------+----------+-----------------+----------------------------+  Arterial anastomosis    113                                                     +--------------------+----------+-----------------+----------------------------+  Prox graft              395                       change in diameter and .22cm  +--------------------+----------+-----------------+----------------------------+  Mid graft               165                                                      +--------------------+----------+-----------------+----------------------------+  Distal graft            193                                                     +--------------------+----------+-----------------+----------------------------+  Venous anastomosis      427                                                     +--------------------+----------+-----------------+----------------------------+  Venous outflow          201                                                     +--------------------+----------+-----------------+----------------------------+ +---------------+------------+----------+---------+--------+------------------+                    Diameter   Depth (cm) Branching   PSV       Flow Volume                           (cm)                           (cm/s)       (ml/min)       +---------------+------------+----------+---------+--------+------------------+  Left rad art                                         41                         dis                                                                            +---------------+------------+----------+---------+--------+------------------+  Antegrade                                                                      +---------------+------------+----------+---------+--------+------------------+  CV stent prx                                        164                        +---------------+------------+----------+---------+--------+------------------+  mid                                                  82                        +---------------+------------+----------+---------+--------+------------------+  dis                                                  86                        +---------------+------------+----------+---------+--------+------------------+  Summary: Patent left BrachAx with narrowing at the proximal graft to .22cm and elevated velocities. Patent central venous stent.  *See table(s) above for measurements and  observations.  Diagnosing physician: [name] MD Electronically signed by [name] MD on 01/05/2019 at 4:43:27 PM.   --------------------------------------------------------------------------------   Final       Assessment/Plan 1. Complication from renal dialysis device, sequela Recommend:  The patient is doing well and currently has adequate dialysis access. The patient's dialysis center is not reporting any access issues. Flow pattern is stable when compared to the prior ultrasound.  The patient should have a duplex ultrasound of the dialysis access in 6 months.  The patient will follow-up with me in the office after each ultrasound   - VAS [place] (AVF, AVG); Future  2. End stage renal disease ([place]) Continue to dialysis without interruption   3. Type 2 diabetes mellitus with chronic kidney disease on chronic dialysis, unspecified whether long term insulin use ([place]) Continue hypoglycemic medications as already ordered, these medications have been reviewed and there are no changes at this time.  Hgb A1C to be monitored as already arranged by primary service   4. Chronic obstructive pulmonary disease, unspecified COPD type ([place]) Continue pulmonary medications and aerosols as already ordered, these medications have been reviewed and there are no changes at this time.  5. Essential hypertension Continue antihypertensive medications as already ordered, these medications have been reviewed and there are no changes at this time.    [name], MD  01/05/2019 9:07 PM

## 2019-01-24 ENCOUNTER — Other Ambulatory Visit: Payer: Self-pay

## 2019-01-25 ENCOUNTER — Inpatient Hospital Stay: Payer: Medicare Other | Attending: Internal Medicine

## 2019-01-25 ENCOUNTER — Inpatient Hospital Stay (HOSPITAL_BASED_OUTPATIENT_CLINIC_OR_DEPARTMENT_OTHER): Payer: Medicare Other | Admitting: Internal Medicine

## 2019-01-25 ENCOUNTER — Other Ambulatory Visit: Payer: Self-pay

## 2019-01-25 VITALS — BP 141/69 | HR 74 | Temp 98.8°F | Resp 20 | Wt 175.0 lb

## 2019-01-25 DIAGNOSIS — Z79899 Other long term (current) drug therapy: Secondary | ICD-10-CM

## 2019-01-25 DIAGNOSIS — C670 Malignant neoplasm of trigone of bladder: Secondary | ICD-10-CM | POA: Insufficient documentation

## 2019-01-25 DIAGNOSIS — E119 Type 2 diabetes mellitus without complications: Secondary | ICD-10-CM | POA: Insufficient documentation

## 2019-01-25 DIAGNOSIS — Z923 Personal history of irradiation: Secondary | ICD-10-CM

## 2019-01-25 DIAGNOSIS — D649 Anemia, unspecified: Secondary | ICD-10-CM | POA: Diagnosis not present

## 2019-01-25 DIAGNOSIS — N186 End stage renal disease: Secondary | ICD-10-CM | POA: Diagnosis not present

## 2019-01-25 DIAGNOSIS — Z9221 Personal history of antineoplastic chemotherapy: Secondary | ICD-10-CM | POA: Insufficient documentation

## 2019-01-25 DIAGNOSIS — Z992 Dependence on renal dialysis: Secondary | ICD-10-CM | POA: Diagnosis not present

## 2019-01-25 DIAGNOSIS — N189 Chronic kidney disease, unspecified: Secondary | ICD-10-CM

## 2019-01-25 DIAGNOSIS — Z87891 Personal history of nicotine dependence: Secondary | ICD-10-CM

## 2019-01-25 DIAGNOSIS — Z95828 Presence of other vascular implants and grafts: Secondary | ICD-10-CM

## 2019-01-25 DIAGNOSIS — Z7984 Long term (current) use of oral hypoglycemic drugs: Secondary | ICD-10-CM

## 2019-01-25 DIAGNOSIS — D631 Anemia in chronic kidney disease: Secondary | ICD-10-CM

## 2019-01-25 LAB — COMPREHENSIVE METABOLIC PANEL
ALT: 10 U/L (ref 0–44)
AST: 15 U/L (ref 15–41)
Albumin: 4.2 g/dL (ref 3.5–5.0)
Alkaline Phosphatase: 99 U/L (ref 38–126)
Anion gap: 13 (ref 5–15)
BUN: 33 mg/dL — ABNORMAL HIGH (ref 8–23)
CO2: 26 mmol/L (ref 22–32)
Calcium: 8.5 mg/dL — ABNORMAL LOW (ref 8.9–10.3)
Chloride: 96 mmol/L — ABNORMAL LOW (ref 98–111)
Creatinine, Ser: 6.52 mg/dL — ABNORMAL HIGH (ref 0.44–1.00)
GFR calc Af Amer: 6 mL/min — ABNORMAL LOW (ref >60)
GFR calc non Af Amer: 6 mL/min — ABNORMAL LOW (ref >60)
Glucose, Bld: 131 mg/dL — ABNORMAL HIGH (ref 70–99)
Potassium: 3.5 mmol/L (ref 3.5–5.1)
Sodium: 135 mmol/L (ref 135–145)
Total Bilirubin: 0.4 mg/dL (ref 0.3–1.2)
Total Protein: 9.1 g/dL — ABNORMAL HIGH (ref 6.5–8.1)

## 2019-01-25 LAB — CBC WITH DIFFERENTIAL/PLATELET
Abs Immature Granulocytes: 0.03 10*3/uL (ref 0.00–0.07)
Basophils Absolute: 0 10*3/uL (ref 0.0–0.1)
Basophils Relative: 1 %
Eosinophils Absolute: 0.1 10*3/uL (ref 0.0–0.5)
Eosinophils Relative: 2 %
HCT: 35.1 % — ABNORMAL LOW (ref 36.0–46.0)
Hemoglobin: 11.2 g/dL — ABNORMAL LOW (ref 12.0–15.0)
Immature Granulocytes: 1 %
Lymphocytes Relative: 22 %
Lymphs Abs: 1.3 10*3/uL (ref 0.7–4.0)
MCH: 30.9 pg (ref 26.0–34.0)
MCHC: 31.9 g/dL (ref 30.0–36.0)
MCV: 97 fL (ref 80.0–100.0)
Monocytes Absolute: 0.5 10*3/uL (ref 0.1–1.0)
Monocytes Relative: 10 %
Neutro Abs: 3.8 10*3/uL (ref 1.7–7.7)
Neutrophils Relative %: 64 %
Platelets: 151 10*3/uL (ref 150–400)
RBC: 3.62 MIL/uL — ABNORMAL LOW (ref 3.87–5.11)
RDW: 14.7 % (ref 11.5–15.5)
WBC: 5.7 10*3/uL (ref 4.0–10.5)
nRBC: 0 % (ref 0.0–0.2)

## 2019-01-25 MED ORDER — HEPARIN SOD (PORK) LOCK FLUSH 100 UNIT/ML IV SOLN
500.0000 [IU] | Freq: Once | INTRAVENOUS | Status: AC
  Administered 2019-01-25: 500 [IU] via INTRAVENOUS

## 2019-01-25 MED ORDER — SODIUM CHLORIDE 0.9% FLUSH
10.0000 mL | Freq: Once | INTRAVENOUS | Status: AC
  Administered 2019-01-25: 10 mL via INTRAVENOUS
  Filled 2019-01-25: qty 10

## 2019-01-25 NOTE — Progress Notes (Signed)
Patient does not offer any problems today. Wants to discuss having port removed.  Still receiving dialysis on Mon, Wed, Fri.

## 2019-01-25 NOTE — Assessment & Plan Note (Addendum)
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  Stable.  December 2019 cystoscopy negative.  # PET jan 6th 2020-  was negative for metastatic disease.  Recommend imaging in approximately 6 months now.  Clinically no concern for recurrence.  # Anemia/hemoglobin ~11-stable continue p.o. iron.  # End-stage renal disease on dialysis [Dr.Singh]; s/p fistula.  Stable.  # [facility]- interested in having port taken out.  We will make a referral for port explantation.   # DISPOSITION:  # port flush every 2 months # referral for port explantation # follow up in 6 month--MD;/port flush/labscbc/cmp; CT C/A/P prior- Dr.B

## 2019-01-25 NOTE — Progress Notes (Signed)
[place] OFFICE PROGRESS NOTE  Patient Care Team: [name], MD as PCP - General (Internal Medicine)  Cancer Staging No matching staging information was found for the patient.   Oncology History Overview Note   #AUG 2017 TURBT- [Dr.Brandon] Large nodular proximally 5 cm bladder mass involving the left hemitrigone extending up the left lateral bladder wall, 2 discrete nodules with bridge of abnormal tissue; Tumor clinically T2. TURBT- Tumor invades muscularis propria [detrusor muscle]. NOT candidate for cystectomy.  # SEP 2017- RT- GEM weekly x2 [gem discontinued sec to poor tolerance]; finished RT dec 2017; Nov 2018- cysto- NEG path [Dr.Brandon]  # ESRD on HD-Dr.Singh / C.diff [aug 2017]  DIAGNOSIS: Muscle invasive bladder ca  STAGE:  II       ;GOALS: curative  CURRENT/MOST RECENT THERAPY : surveillaince    Cancer of trigone of urinary bladder ([place])       INTERVAL HISTORY:  Barbara [name] 78 y.o.  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  Patient states that she overall doing well.  She continues to complain of chronic joint pain back pain.  Patient denies any blood in urine.  Denies any nausea vomiting abdominal pain.  Patient is currently getting dialysis through her fistula.   Patient unable to keep her appointment with urology because of covid pandemic.  She is awaiting follow-up with urology office next month.   Review of Systems  Constitutional: Positive for malaise/fatigue. Negative for chills, diaphoresis, fever and weight loss.  HENT: Negative for nosebleeds and sore throat.   Eyes: Negative for double vision.  Respiratory: Negative for cough, hemoptysis, sputum production, shortness of breath and wheezing.   Cardiovascular: Positive for leg swelling. Negative for chest pain, palpitations and  orthopnea.  Gastrointestinal: Negative for abdominal pain, blood in stool, constipation, diarrhea, heartburn, melena, nausea and vomiting.  Genitourinary: Negative for dysuria, frequency and urgency.  Musculoskeletal: Positive for back pain and joint pain.  Skin: Negative.  Negative for itching and rash.  Neurological: Negative for dizziness, tingling, focal weakness, weakness and headaches.  Endo/Heme/Allergies: Does not bruise/bleed easily.  Psychiatric/Behavioral: Negative for depression. The patient is not nervous/anxious and does not have insomnia.      PAST MEDICAL HISTORY :  Past Medical History:  Diagnosis Date  . Adult celiac disease   . Anemia associated with chronic renal failure 2017   blood transfusion last week 10/17  . Arthritis   . Asthma   . Cancer () 2017   bladder  . Chronic kidney disease   . 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  dr. Candiss Norse and dr. Holley Raring follow her  . COPD (chronic obstructive pulmonary disease) ([place])   . Coronary artery disease   . Diabetes mellitus without complication ([place])   . Dialysis patient ([place])    Tues, Thurs, Sat  . Dyspnea    with exertion  . Elevated lipids   . GERD (gastroesophageal reflux disease)   . Hematuria   . Hypertension   . Lower back pain   . Neuropathy   . Oxygen dependent    at hs  . Personal history of chemotherapy 2017   bladder ca  . Personal history of radiation therapy 2017   bladder ca  . Sleep apnea    uses cpap  . Urinary obstruction 01/2016    PAST SURGICAL HISTORY :  Past Surgical History:  Procedure Laterality Date  . A/V SHUNT INTERVENTION N/A 05/12/2018   Procedure: A/V SHUNT INTERVENTION;  Surgeon: Algernon Huxley, MD;  Location: [facility];  Service: Cardiovascular;  Laterality: N/A;  . A/V SHUNT INTERVENTION Left 07/29/2018   Procedure: A/V SHUNT INTERVENTION;  Surgeon: Katha Cabal, MD;  Location: [facility];  Service: Cardiovascular;   Laterality: Left;  . ABDOMINAL HYSTERECTOMY    . AV FISTULA PLACEMENT Left 02/17/2017   Procedure: INSERTION OF ARTERIOVENOUS (AV) GORE-TEX GRAFT ARM(BRACHIAL AXILLARY);  Surgeon: Katha Cabal, MD;  Location: [facility];  Service: Vascular;  Laterality: Left;  . CYSTOSCOPY W/ RETROGRADES Bilateral 02/17/2016   Procedure: CYSTOSCOPY WITH RETROGRADE PYELOGRAM;  Surgeon: Hollice Espy, MD;  Location: [facility];  Service: Urology;  Laterality: Bilateral;  . CYSTOSCOPY W/ RETROGRADES Bilateral 10/12/2016   Procedure: CYSTOSCOPY WITH RETROGRADE PYELOGRAM;  Surgeon: Hollice Espy, MD;  Location: [facility];  Service: Urology;  Laterality: Bilateral;  . CYSTOSCOPY W/ RETROGRADES Bilateral 06/09/2017   Procedure: CYSTOSCOPY WITH RETROGRADE PYELOGRAM;  Surgeon: Hollice Espy, MD;  Location: [facility];  Service: Urology;  Laterality: Bilateral;  . CYSTOSCOPY W/ RETROGRADES Bilateral 10/07/2017   Procedure: CYSTOSCOPY WITH RETROGRADE PYELOGRAM;  Surgeon: Hollice Espy, MD;  Location: [facility];  Service: Urology;  Laterality: Bilateral;  . CYSTOSCOPY W/ URETERAL STENT PLACEMENT Left 05/12/2016   Procedure: CYSTOSCOPY WITH STENT REPLACEMENT;  Surgeon: Hollice Espy, MD;  Location: [facility];  Service: Urology;  Laterality: Left;  . CYSTOSCOPY W/ URETERAL STENT PLACEMENT Left 10/12/2016   Procedure: CYSTOSCOPY WITH STENT REPLACEMENT;  Surgeon: Hollice Espy, MD;  Location: [facility];  Service: Urology;  Laterality: Left;  . CYSTOSCOPY WITH BIOPSY N/A 06/09/2017   Procedure: CYSTOSCOPY WITH BLADDER BIOPSY;  Surgeon: Hollice Espy, MD;  Location: [facility];  Service: Urology;  Laterality: N/A;  . CYSTOSCOPY WITH STENT PLACEMENT Left 01/21/2016   Procedure: CYSTOSCOPY WITH double J STENT PLACEMENT;  Surgeon: Franchot Gallo, MD;  Location: [facility];  Service: Urology;  Laterality: Left;  . CYSTOSCOPY WITH STENT PLACEMENT Right 10/12/2016   Procedure: CYSTOSCOPY WITH STENT PLACEMENT;  Surgeon: Hollice Espy, MD;   Location: [facility];  Service: Urology;  Laterality: Right;  . DIALYSIS/PERMA CATHETER INSERTION N/A 11/20/2016   Procedure: Dialysis/Perma Catheter Insertion;  Surgeon: Algernon Huxley, MD;  Location: [place] [facility];  Service: Cardiovascular;  Laterality: N/A;  . DIALYSIS/PERMA CATHETER REMOVAL N/A 03/30/2017   Procedure: DIALYSIS/PERMA CATHETER REMOVAL;  Surgeon: Katha Cabal, MD;  Location: [facility];  Service: Cardiovascular;  Laterality: N/A;  . KIDNEY SURGERY  01/21/2016   IR NEPHROSTOMY PLACEMENT LEFT   . PERIPHERAL VASCULAR CATHETERIZATION N/A 04/07/2016   Procedure: Glori Luis Cath Insertion;  Surgeon: Katha Cabal, MD;  Location: [facility] [facility];  Service: Cardiovascular;  Laterality: N/A;  . PERIPHERAL VASCULAR THROMBECTOMY Left 06/02/2017   Procedure: PERIPHERAL VASCULAR THROMBECTOMY;  Surgeon: Algernon Huxley, MD;  Location: [facility];  Service: Cardiovascular;  Laterality: Left;  . PERIPHERAL VASCULAR THROMBECTOMY Left 01/28/2018   Procedure: PERIPHERAL VASCULAR THROMBECTOMY;  Surgeon: Katha Cabal, MD;  Location: [facility];  Service: Cardiovascular;  Laterality: Left;  . PERIPHERAL VASCULAR THROMBECTOMY Left 03/14/2018   Procedure: PERIPHERAL VASCULAR THROMBECTOMY;  Surgeon: Algernon Huxley, MD;  Location: [facility];  Service: Cardiovascular;  Laterality: Left;  . PERIPHERAL VASCULAR THROMBECTOMY Left 03/16/2018   Procedure: PERIPHERAL VASCULAR THROMBECTOMY;  Surgeon: Katha Cabal, MD;  Location:  [place] [facility];  Service: Cardiovascular;  Laterality: Left;  . PORTACATH PLACEMENT Right   . ROTATOR CUFF REPAIR Left   . TEMPORARY DIALYSIS CATHETER N/A 05/11/2018   Procedure: TEMPORARY DIALYSIS CATHETER;  Surgeon: Katha Cabal, MD;  Location: [facility];  Service: Cardiovascular;  Laterality: N/A;  . TEMPORARY DIALYSIS CATHETER Left 07/27/2018   Procedure: TEMPORARY DIALYSIS CATHETER;  Surgeon: Katha Cabal, MD;  Location: [place];  Service: Cardiovascular;  Laterality: Left;  . TRANSURETHRAL RESECTION OF BLADDER TUMOR N/A 02/17/2016   Procedure: TRANSURETHRAL RESECTION OF BLADDER TUMOR (TURBT)-LARGE;  Surgeon: Hollice Espy, MD;  Location: [facility];  Service: Urology;  Laterality: N/A;  . TRANSURETHRAL RESECTION OF BLADDER TUMOR N/A 10/07/2017   Procedure: TRANSURETHRAL RESECTION OF BLADDER TUMOR (TURBT)-small;  Surgeon: Hollice Espy, MD;  Location: [facility];  Service: Urology;  Laterality: N/A;  . URETEROSCOPY Left 02/17/2016   Procedure: URETEROSCOPY;  Surgeon: Hollice Espy, MD;  Location: [facility];  Service: Urology;  Laterality: Left;  . URETEROSCOPY Right 10/12/2016   Procedure: URETEROSCOPY;  Surgeon: Hollice Espy, MD;  Location: [facility];  Service: Urology;  Laterality: Right;    FAMILY HISTORY :   Family History  Problem Relation Age of Onset  . Diabetes Mother   . Cancer Father   . Breast cancer Neg Hx   . Kidney disease Neg Hx     SOCIAL HISTORY:   Social History   Tobacco Use  . Smoking status: Former Smoker    Types: Cigarettes    Quit date: 07/20/2002    Years since quitting: 16.5  . Smokeless tobacco: Never Used  . Tobacco comment: 01/22/2016   "  quit smoking many years ago "  Substance Use Topics  . Alcohol use: No  . Drug use: No    ALLERGIES:  is allergic to glipizide er and percocet [oxycodone-acetaminophen].  MEDICATIONS:  Current Outpatient Medications  Medication Sig Dispense Refill  . ACCU-CHEK AVIVA PLUS test strip 1 each by Other route daily. use as directed  3  . ACCU-CHEK SOFTCLIX LANCETS lancets INJECT 1 EACH AS DIRECTED ONCE DAILY.  3  . acetaminophen (TYLENOL) 500 MG tablet Take 1,000 mg by mouth 2 (two) times daily as needed for moderate pain or headache.     . albuterol (PROVENTIL HFA;VENTOLIN HFA) 108 (90 Base) MCG/ACT inhaler Inhale into the lungs.    Marland Kitchen atorvastatin (LIPITOR) 40 MG tablet Take 40 mg by mouth daily.    5  . Blood Glucose Monitoring Suppl (ACCU-CHEK AVIVA PLUS) w/Device KIT (USE TO MONITOR BLOOD SUGARS.)  0  . calcium acetate (PHOSLO) 667 MG capsule Take 2 capsules (1,334 mg total) by mouth 3 (three) times daily with meals. 90 capsule 0  . cholecalciferol (VITAMIN D) 1000 units tablet Take 1,000 Units by mouth daily.    . cinacalcet (SENSIPAR) 30 MG tablet Take 30 mg by mouth daily. With largest meal    . esomeprazole (NEXIUM) 40 MG capsule Take 40 mg by mouth daily after breakfast.     . ferrous sulfate 325 (65 FE) MG EC tablet Take 325 mg by mouth daily after breakfast.     . furosemide (LASIX) 40 MG tablet Take 40 mg by mouth 2 (two) times daily. Take morning dose every day. Lunch dose as needed.    . gabapentin (NEURONTIN) 300 MG capsule Take 600 mg by mouth 3 (three) times daily.    Marland Kitchen glucose blood test strip USE AS DIRECTED ONCE  DAILY    . ibuprofen (ADVIL) 200 MG tablet Take 200 mg by mouth every 6 (six) hours as needed.    . mirabegron ER (MYRBETRIQ) 25 MG TB24 tablet Take by mouth.    . NON FORMULARY Place 1 Units into the nose at bedtime. CPAP Time of use 2100-0600    . oxybutynin (DITROPAN) 5 MG tablet Take 5 mg by mouth 3 (three) times daily.    . OXYGEN Inhale 2 L into the lungs at bedtime.    Marland Kitchen levofloxacin (LEVAQUIN) 250 MG tablet TAKE 1 TABLET (250 MG TOTAL) BY MOUTH EVERY OTHER DAY FOR 3 DOSES    . lidocaine (XYLOCAINE) 5 % ointment Apply 1 application topically 2 (two) times daily as needed for mild pain.     Marland Kitchen lidocaine-prilocaine (EMLA) cream APPLY TO AFFECTED AREA ONCE  4  . traMADol (ULTRAM) 50 MG tablet Take 50 mg by mouth every 12 (twelve) hours as needed.     No current facility-administered medications for this visit.     PHYSICAL EXAMINATION: ECOG PERFORMANCE STATUS: 2 - Symptomatic, <50% confined to bed  BP (!) 141/69   Pulse 74   Temp 98.8 F (37.1 C)   Resp 20   Wt 175 lb (79.4 kg)   BMI 26.61 kg/m   Filed Weights   01/25/19 1434  Weight: 175 lb  (79.4 kg)    Physical Exam  Constitutional: She is oriented to person, place, and time and well-developed, well-nourished, and in no distress.  Obese.  Alone.  In a wheelchair.  HENT:  Head: Normocephalic and atraumatic.  Mouth/Throat: Oropharynx is clear and moist. No oropharyngeal exudate.  Eyes: Pupils are equal, round, and reactive to light.  Neck: Normal range of motion. Neck supple.  Cardiovascular: Normal rate and regular rhythm.  Pulmonary/Chest: No respiratory distress. She has no wheezes.  Decreased breath sounds bilaterally bases.  Abdominal: Soft. Bowel sounds are normal. She exhibits no distension and no mass. There is no abdominal tenderness. There is no rebound and no guarding.  Musculoskeletal: Normal range of motion.        General: Edema (Mild leg swelling.) present. No tenderness.  Neurological: She is alert and oriented to person, place, and time.  Skin: Skin is warm.  Psychiatric: Affect normal.    LABORATORY DATA:  I have reviewed the data as listed    Component Value Date/Time   NA 135 01/25/2019 1407   NA 138 11/04/2016 1453   NA 138 08/07/2012 0433   K 3.5 01/25/2019 1407   K 3.2 (L) 10/30/2014 1410   CL 96 (L) 01/25/2019 1407   CL 104 08/07/2012 0433   CO2 26 01/25/2019 1407   CO2 25 08/07/2012 0433   GLUCOSE 131 (H) 01/25/2019 1407   GLUCOSE 172 (H) 08/07/2012 0433   BUN 33 (H) 01/25/2019 1407   BUN 69 (H) 11/04/2016 1453   BUN 63 (H) 08/07/2012 0433   CREATININE 6.52 (H) 01/25/2019 1407   CREATININE 2.48 (H) 08/07/2012 0433   CALCIUM 8.5 (L) 01/25/2019 1407   CALCIUM 9.1 08/07/2012 0433   PROT 9.1 (H) 01/25/2019 1407   PROT 8.9 (H) 08/02/2012 0958   ALBUMIN 4.2 01/25/2019 1407   ALBUMIN 3.9 08/02/2012 0958   AST 15 01/25/2019 1407   AST 21 08/02/2012 0958   ALT 10 01/25/2019 1407   ALT 19 08/02/2012 0958   ALKPHOS 99 01/25/2019 1407   ALKPHOS 107 08/02/2012 0958   BILITOT 0.4 01/25/2019 1407   BILITOT 0.3  08/02/2012 0958   GFRNONAA 6  (L) 01/25/2019 1407   GFRNONAA 19 (L) 08/07/2012 0433   GFRAA 6 (L) 01/25/2019 1407   GFRAA 22 (L) 08/07/2012 0433    No results found for: SPEP, UPEP  Lab Results  Component Value Date   WBC 5.7 01/25/2019   NEUTROABS 3.8 01/25/2019   HGB 11.2 (L) 01/25/2019   HCT 35.1 (L) 01/25/2019   MCV 97.0 01/25/2019   PLT 151 01/25/2019      Chemistry      Component Value Date/Time   NA 135 01/25/2019 1407   NA 138 11/04/2016 1453   NA 138 08/07/2012 0433   K 3.5 01/25/2019 1407   K 3.2 (L) 10/30/2014 1410   CL 96 (L) 01/25/2019 1407   CL 104 08/07/2012 0433   CO2 26 01/25/2019 1407   CO2 25 08/07/2012 0433   BUN 33 (H) 01/25/2019 1407   BUN 69 (H) 11/04/2016 1453   BUN 63 (H) 08/07/2012 0433   CREATININE 6.52 (H) 01/25/2019 1407   CREATININE 2.48 (H) 08/07/2012 0433      Component Value Date/Time   CALCIUM 8.5 (L) 01/25/2019 1407   CALCIUM 9.1 08/07/2012 0433   ALKPHOS 99 01/25/2019 1407   ALKPHOS 107 08/02/2012 0958   AST 15 01/25/2019 1407   AST 21 08/02/2012 0958   ALT 10 01/25/2019 1407   ALT 19 08/02/2012 0958   BILITOT 0.4 01/25/2019 1407   BILITOT 0.3 08/02/2012 0958       RADIOGRAPHIC STUDIES: I have personally reviewed the radiological images as listed and agreed with the findings in the report. No results found.   ASSESSMENT & PLAN:  Cancer of trigone of urinary bladder ([place]) # muscle invasive bladder cancer/ pT2/stage II-status post definitive radiation [not cystectomy candidate].  Stable.  December 2019 cystoscopy negative.  # PET jan 6th 2020-  was negative for metastatic disease.  Recommend imaging in approximately 6 months now.  Clinically no concern for recurrence.  # Anemia/hemoglobin ~11-stable continue p.o. iron.  # End-stage renal disease on dialysis [Dr.Singh]; s/p fistula.  Stable.  # Mediport- interested in having port taken out.  We will make a referral for port explantation.   # DISPOSITION:  # port flush every 2 months # referral  for port explantation # follow up in 6 month--MD;/port flush/labscbc/cmp; CT C/A/P prior- Dr.B    Orders Placed This Encounter  Procedures  . CT CHEST WO CONTRAST    Standing Status:   Future    Standing Expiration Date:   01/25/2020    Order Specific Question:   Preferred imaging location?    Answer:   [facility]    Order Specific Question:   Radiology Contrast Protocol - do NOT remove file path    Answer:   [email or web address]    Order Specific Question:   ** REASON FOR EXAM (FREE TEXT)    Answer:   bladder cancer  . CT Abdomen Pelvis Wo Contrast    Standing Status:   Future    Standing Expiration Date:   01/25/2020    Order Specific Question:   ** REASON FOR EXAM (FREE TEXT)    Answer:   bladder cancer    Order Specific Question:   Preferred imaging location?    Answer:   [facility]    Order Specific Question:   Is Oral Contrast requested for this exam?    Answer:   Yes, Per Radiology protocol    Order Specific Question:  Radiology Contrast Protocol - do NOT remove file path    Answer:   \\[email or web address]  . Ambulatory referral to Vascular Surgery    Referral Priority:   Routine    Referral Type:   Surgical    Referral Reason:   Specialty Services Required    Referred to Provider:   Katha Cabal, MD    Requested Specialty:   Vascular Surgery    Number of Visits Requested:   1   All questions were answered. The patient knows to call the clinic with any problems, questions or concerns.      Cammie Sickle, MD 01/25/2019 9:29 PM

## 2019-01-26 ENCOUNTER — Telehealth (INDEPENDENT_AMBULATORY_CARE_PROVIDER_SITE_OTHER): Payer: Self-pay

## 2019-01-26 NOTE — Telephone Encounter (Signed)
I have attempted to contact the patient to schedule a port removal for the past two days, I am unable to make contact or leave a message the phone only rings.

## 2019-01-27 NOTE — Telephone Encounter (Signed)
I have attempted to contact the patient today and was unable to leave a message due to the patient not have a voicemail or answering machine set up.

## 2019-01-30 ENCOUNTER — Encounter (INDEPENDENT_AMBULATORY_CARE_PROVIDER_SITE_OTHER): Payer: Self-pay

## 2019-01-30 NOTE — Telephone Encounter (Signed)
Spoke with the patient and she is now scheduled with Dr. Delana Meyer for 02/07/2019 with a 8:00 am arrival time and Covid testing on 7/17/ 2020 between 12:30-2:30 pm. This will be mailed to the patients home.

## 2019-02-02 ENCOUNTER — Other Ambulatory Visit (INDEPENDENT_AMBULATORY_CARE_PROVIDER_SITE_OTHER): Payer: Self-pay | Admitting: Nurse Practitioner

## 2019-02-03 ENCOUNTER — Other Ambulatory Visit: Payer: Self-pay

## 2019-02-03 ENCOUNTER — Other Ambulatory Visit
Admission: RE | Admit: 2019-02-03 | Discharge: 2019-02-03 | Disposition: A | Discharge status: Home / Self Care | Payer: Medicare Other | Source: Ambulatory Visit | Attending: Vascular Surgery | Admitting: Vascular Surgery

## 2019-02-03 DIAGNOSIS — Z1159 Encounter for screening for other viral diseases: Secondary | ICD-10-CM | POA: Insufficient documentation

## 2019-02-03 LAB — SARS CORONAVIRUS 2 (TAT 6-24 HRS): SARS Coronavirus 2: NEGATIVE

## 2019-02-06 MED ORDER — CEFAZOLIN SODIUM-DEXTROSE 2-4 GM/100ML-% IV SOLN
2.0000 g | Freq: Once | INTRAVENOUS | Status: AC
  Administered 2019-02-07: 1 g via INTRAVENOUS

## 2019-02-07 ENCOUNTER — Ambulatory Visit
Admission: RE | Admit: 2019-02-07 | Discharge: 2019-02-07 | Disposition: A | Discharge status: Home / Self Care | Payer: Medicare Other | Attending: Vascular Surgery | Admitting: Vascular Surgery

## 2019-02-07 ENCOUNTER — Encounter
Admission: RE | Disposition: A | Discharge status: Home / Self Care | Payer: Self-pay | Source: Home / Self Care | Attending: Vascular Surgery

## 2019-02-07 ENCOUNTER — Other Ambulatory Visit: Payer: Self-pay

## 2019-02-07 DIAGNOSIS — C679 Malignant neoplasm of bladder, unspecified: Secondary | ICD-10-CM

## 2019-02-07 DIAGNOSIS — Z833 Family history of diabetes mellitus: Secondary | ICD-10-CM | POA: Diagnosis not present

## 2019-02-07 DIAGNOSIS — Z452 Encounter for adjustment and management of vascular access device: Secondary | ICD-10-CM | POA: Diagnosis present

## 2019-02-07 DIAGNOSIS — Z79899 Other long term (current) drug therapy: Secondary | ICD-10-CM | POA: Insufficient documentation

## 2019-02-07 DIAGNOSIS — Z90710 Acquired absence of both cervix and uterus: Secondary | ICD-10-CM | POA: Insufficient documentation

## 2019-02-07 DIAGNOSIS — G4730 Sleep apnea, unspecified: Secondary | ICD-10-CM | POA: Diagnosis not present

## 2019-02-07 DIAGNOSIS — I2510 Atherosclerotic heart disease of native coronary artery without angina pectoris: Secondary | ICD-10-CM | POA: Insufficient documentation

## 2019-02-07 DIAGNOSIS — E1122 Type 2 diabetes mellitus with diabetic chronic kidney disease: Secondary | ICD-10-CM | POA: Insufficient documentation

## 2019-02-07 DIAGNOSIS — Z992 Dependence on renal dialysis: Secondary | ICD-10-CM | POA: Insufficient documentation

## 2019-02-07 DIAGNOSIS — Z9981 Dependence on supplemental oxygen: Secondary | ICD-10-CM | POA: Insufficient documentation

## 2019-02-07 DIAGNOSIS — E1140 Type 2 diabetes mellitus with diabetic neuropathy, unspecified: Secondary | ICD-10-CM | POA: Insufficient documentation

## 2019-02-07 DIAGNOSIS — I120 Hypertensive chronic kidney disease with stage 5 chronic kidney disease or end stage renal disease: Secondary | ICD-10-CM | POA: Insufficient documentation

## 2019-02-07 DIAGNOSIS — Z885 Allergy status to narcotic agent status: Secondary | ICD-10-CM | POA: Diagnosis not present

## 2019-02-07 DIAGNOSIS — D631 Anemia in chronic kidney disease: Secondary | ICD-10-CM | POA: Insufficient documentation

## 2019-02-07 DIAGNOSIS — C670 Malignant neoplasm of trigone of bladder: Secondary | ICD-10-CM | POA: Insufficient documentation

## 2019-02-07 DIAGNOSIS — K900 Celiac disease: Secondary | ICD-10-CM | POA: Diagnosis not present

## 2019-02-07 DIAGNOSIS — Z87891 Personal history of nicotine dependence: Secondary | ICD-10-CM | POA: Diagnosis not present

## 2019-02-07 DIAGNOSIS — M1990 Unspecified osteoarthritis, unspecified site: Secondary | ICD-10-CM | POA: Diagnosis not present

## 2019-02-07 DIAGNOSIS — N185 Chronic kidney disease, stage 5: Secondary | ICD-10-CM | POA: Diagnosis not present

## 2019-02-07 DIAGNOSIS — J449 Chronic obstructive pulmonary disease, unspecified: Secondary | ICD-10-CM | POA: Insufficient documentation

## 2019-02-07 DIAGNOSIS — K219 Gastro-esophageal reflux disease without esophagitis: Secondary | ICD-10-CM | POA: Diagnosis not present

## 2019-02-07 HISTORY — PX: PORTA CATH REMOVAL: CATH118286

## 2019-02-07 LAB — GLUCOSE, CAPILLARY
Glucose-Capillary: 85 mg/dL (ref 70–99)
Glucose-Capillary: 86 mg/dL (ref 70–99)

## 2019-02-07 SURGERY — PORTA CATH REMOVAL
Anesthesia: Moderate Sedation

## 2019-02-07 MED ORDER — MIDAZOLAM HCL 2 MG/2ML IJ SOLN
INTRAMUSCULAR | Status: DC | PRN
  Administered 2019-02-07: 1 mg via INTRAVENOUS

## 2019-02-07 MED ORDER — MIDAZOLAM HCL 5 MG/5ML IJ SOLN
INTRAMUSCULAR | Status: AC
  Filled 2019-02-07: qty 5

## 2019-02-07 MED ORDER — FENTANYL CITRATE (PF) 100 MCG/2ML IJ SOLN
INTRAMUSCULAR | Status: DC | PRN
  Administered 2019-02-07: 50 ug via INTRAVENOUS

## 2019-02-07 MED ORDER — FENTANYL CITRATE (PF) 100 MCG/2ML IJ SOLN
INTRAMUSCULAR | Status: AC
  Filled 2019-02-07: qty 2

## 2019-02-07 MED ORDER — ONDANSETRON HCL 4 MG/2ML IJ SOLN: 4.0000 mg | Freq: Four times a day (QID) | INTRAMUSCULAR | Status: DC | PRN

## 2019-02-07 MED ORDER — SODIUM CHLORIDE 0.9 % IV SOLN
INTRAVENOUS | Status: DC
  Administered 2019-02-07: 08:00:00 via INTRAVENOUS

## 2019-02-07 MED ORDER — HYDROMORPHONE HCL 1 MG/ML IJ SOLN: 1.0000 mg | Freq: Once | INTRAMUSCULAR | Status: DC | PRN

## 2019-02-07 SURGICAL SUPPLY — 8 items
DERMABOND ADVANCED (GAUZE/BANDAGES/DRESSINGS) ×2
DERMABOND ADVANCED .7 DNX12 (GAUZE/BANDAGES/DRESSINGS) IMPLANT
PACK ANGIOGRAPHY (CUSTOM PROCEDURE TRAY) ×2 IMPLANT
SPONGE XRAY 4X4 16PLY STRL (MISCELLANEOUS) ×2 IMPLANT
SUT MNCRL AB 4-0 PS2 18 (SUTURE) ×2 IMPLANT
SUT SILK 0 FSL (SUTURE) IMPLANT
SUT VIC AB 3-0 SH 27 (SUTURE) ×2
SUT VIC AB 3-0 SH 27X BRD (SUTURE) IMPLANT

## 2019-02-07 NOTE — Op Note (Signed)
  OPERATIVE NOTE   PROCEDURE: Removal of Infuse-a-Port  PRE-OPERATIVE DIAGNOSIS: Bladder carcinoma  POST-OPERATIVE DIAGNOSIS: Same  SURGEON: [name], M.D.  ANESTHESIA: Conscious sedation was administered under my direct supervision by the interventional radiology RN. IV Versed plus fentanyl were utilized. Continuous ECG, pulse oximetry and blood pressure was monitored throughout the entire procedure.  Conscious sedation was for a total of 25 minutes.   ESTIMATED BLOOD LOSS: Minimal   SPECIMEN(S):  Infuse-a-port intact  INDICATIONS:   Barbara [name] is a 78 y.o. y.o. female who presents with bladder carcinoma.  The chemotherapy has been completed and the port is no longer required. Patient is therefore undergoing removal of the port. The risks and benefits of been reviewed all questions answered patient agrees to proceed with port removal   DESCRIPTION: After obtaining full informed written consent, the patient is brought to special procedures and positioned supine.  The patient received IV antibiotics.  The patient was prepped and draped in the standard fashion appropriate time out is called.    After infiltrating 1% lidocaine with epinephrine into the soft tissues and skin surrounding the port the previous incisional scar is reopened with an 11 blade scalpel.  The port is slipped from the pocket and subsequently removed without difficulty otherwise intact.  Pressure is held at the base of the neck for 5 minutes, a pocket is irrigated. Subtotally the wound is packed with saline moistened gauze and sterile dressings applied.  The patient tolerated the procedure without changes  COMPLICATIONS: None  CONDITION: Good  [name], M.D. [facility] Office: [phone]   02/07/2019, 9:18 AM

## 2019-02-07 NOTE — Discharge Instructions (Signed)
Implanted Port Removal, Care After This sheet gives you information about how to care for yourself after your procedure. Your health care provider may also give you more specific instructions. If you have problems or questions, contact your health care provider. What can I expect after the procedure? After the procedure, it is common to have:  Soreness or pain near your incision.  Some swelling or bruising near your incision. Follow these instructions at home: Medicines  Take over-the-counter and prescription medicines only as told by your health care provider.  If you were prescribed an antibiotic medicine, take it as told by your health care provider. Do not stop taking the antibiotic even if you start to feel better. Bathing  Do not take baths, swim, or use a hot tub until your health care provider approves. Ask your health care provider if you can take showers. You may only be allowed to take sponge baths. Incision care   Follow instructions from your health care provider about how to take care of your incision. Make sure you: ? Wash your hands with soap and water before you change your bandage (dressing). If soap and water are not available, use hand sanitizer. ? Change your dressing as told by your health care provider. ? Keep your dressing dry. ? Leave stitches (sutures), skin glue, or adhesive strips in place. These skin closures may need to stay in place for 2 weeks or longer. If adhesive strip edges start to loosen and curl up, you may trim the loose edges. Do not remove adhesive strips completely unless your health care provider tells you to do that.  Check your incision area every day for signs of infection. Check for: ? More redness, swelling, or pain. ? More fluid or blood. ? Warmth. ? Pus or a bad smell. Driving   Do not drive for 24 hours if you were given a medicine to help you relax (sedative) during your procedure.  If you did not receive a sedative, ask your  health care provider when it is safe to drive. Activity  Return to your normal activities as told by your health care provider. Ask your health care provider what activities are safe for you.  Do not lift anything that is heavier than 10 lb (4.5 kg), or the limit that you are told, until your health care provider says that it is safe.  Do not do activities that involve lifting your arms over your head. General instructions  Do not use any products that contain nicotine or tobacco, such as cigarettes and e-cigarettes. These can delay healing. If you need help quitting, ask your health care provider.  Keep all follow-up visits as told by your health care provider. This is important. Contact a health care provider if:  You have more redness, swelling, or pain around your incision.  You have more fluid or blood coming from your incision.  Your incision feels warm to the touch.  You have pus or a bad smell coming from your incision.  You have pain that is not relieved by your pain medicine. Get help right away if you have:  A fever or chills.  Chest pain.  Difficulty breathing. Summary  After the procedure, it is common to have pain, soreness, swelling, or bruising near your incision.  If you were prescribed an antibiotic medicine, take it as told by your health care provider. Do not stop taking the antibiotic even if you start to feel better.  Do not drive for 24 hours  if you were given a sedative during your procedure.  Return to your normal activities as told by your health care provider. Ask your health care provider what activities are safe for you. This information is not intended to replace advice given to you by your health care provider. Make sure you discuss any questions you have with your health care provider. Document Released: 06/17/2015 Document Revised: 08/19/2017 Document Reviewed: 08/19/2017 Elsevier Patient Education  [place].    Moderate  Conscious Sedation, Adult, Care After These instructions provide you with information about caring for yourself after your procedure. Your health care provider may also give you more specific instructions. Your treatment has been planned according to current medical practices, but problems sometimes occur. Call your health care provider if you have any problems or questions after your procedure. What can I expect after the procedure? After your procedure, it is common:  To feel sleepy for several hours.  To feel clumsy and have poor balance for several hours.  To have poor judgment for several hours.  To vomit if you eat too soon. Follow these instructions at home: For at least 24 hours after the procedure:   Do not: ? Participate in activities where you could fall or become injured. ? Drive. ? Use heavy machinery. ? Drink alcohol. ? Take sleeping pills or medicines that cause drowsiness. ? Make important decisions or sign legal documents. ? Take care of children on your own.  Rest. Eating and drinking  Follow the diet recommended by your health care provider.  If you vomit: ? Drink water, juice, or soup when you can drink without vomiting. ? Make sure you have little or no nausea before eating solid foods. General instructions  Have a responsible adult stay with you until you are awake and alert.  Take over-the-counter and prescription medicines only as told by your health care provider.  If you smoke, do not smoke without supervision.  Keep all follow-up visits as told by your health care provider. This is important. Contact a health care provider if:  You keep feeling nauseous or you keep vomiting.  You feel light-headed.  You develop a rash.  You have a fever. Get help right away if:  You have trouble breathing. This information is not intended to replace advice given to you by your health care provider. Make sure you discuss any questions you have with your  health care provider. Document Released: 04/26/2013 Document Revised: 06/18/2017 Document Reviewed: 10/26/2015 Elsevier Patient Education  2020 [employer].

## 2019-02-07 NOTE — H&P (Signed)
[facility] History & Physical Update  The patient was interviewed and re-examined.  The patient's previous History and Physical, per Dr. Aletha Halim note dated 01/25/2019, has been reviewed and is unchanged.  There is no change in the plan of care. We plan to proceed with the scheduled procedure of explantation of the [place]use[place]a-Port.  [name], MD  02/07/2019, 8:42 AM

## 2019-02-08 ENCOUNTER — Encounter: Payer: Self-pay | Admitting: Vascular Surgery

## 2019-02-28 ENCOUNTER — Other Ambulatory Visit: Payer: Self-pay

## 2019-02-28 ENCOUNTER — Ambulatory Visit (INDEPENDENT_AMBULATORY_CARE_PROVIDER_SITE_OTHER): Payer: Medicare Other | Admitting: Urology

## 2019-02-28 ENCOUNTER — Encounter: Payer: Self-pay | Admitting: Urology

## 2019-02-28 VITALS — BP 150/66 | HR 71 | Ht 68.0 in | Wt 180.0 lb

## 2019-02-28 DIAGNOSIS — Z8551 Personal history of malignant neoplasm of bladder: Secondary | ICD-10-CM | POA: Diagnosis not present

## 2019-02-28 LAB — URINALYSIS, COMPLETE
Bilirubin, UA: NEGATIVE
Glucose, UA: NEGATIVE
Ketones, UA: NEGATIVE
Nitrite, UA: NEGATIVE
Specific Gravity, UA: 1.02 (ref 1.005–1.030)
Urobilinogen, Ur: 0.2 mg/dL (ref 0.2–1.0)
pH, UA: 7 (ref 5.0–7.5)

## 2019-02-28 LAB — MICROSCOPIC EXAMINATION
Bacteria, UA: NONE SEEN
RBC, Urine: >30 /hpf — AB (ref 0–2)

## 2019-02-28 NOTE — Progress Notes (Signed)
02/28/19  CC:  Chief Complaint  Patient presents with  . Cysto    HPI: 78 year old female with history of muscle invasive bladder cancer diagnosed in 01/2015 status post TURBT followed by palliative gemcitabine and concurrent radiation.    She also underwent recent CT PET scan on 01/01/2017 which shows marked bladder wall thickening and bilateral hydronephrosis despite ureteral stents in place. There is no evidence of metastatic disease.   She was taken to the operating room on 10/12/2016 at which time the bladder was noted to be diffusely erythematous, small in capacity with newly identified right hydroureteronephrosis down to level of the distal ureter with a negative ureteroscopy. This possibly related to radiation changes of the distal ureter. Bilateral ureteral stents replaced at that time.  She was subsequently started on hemodialysis. 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  There is no evidence of malignancy.  The left UO was scarred down and unable to be cannulated at the time of retrograde pyelogram.  She again returned to the operating room for repeat bladder biopsy on 10/07/2017.  Pathology again negative for malignancy, consisten with radiation change.  Most recent staging imaging in the form of PET scan on 1/6 2020 shows persistent marked diffuse bladder wall thickening, persistent high-grade bilateral hydroureteronephrosis, and nonspecific uptake in enlarged right common iliac lymph node which is been seen on multiple prior imaging which is in fact decreased in size.  She will be due for repeat imaging in December.    Today's Vitals   02/28/19 0846  BP: (!) 150/66  Pulse: 71  Weight: 180 lb (81.6 kg)  Height: 5' 8"  (1.727 m)   Body mass index is 27.37 kg/m. NED. A&Ox3.   No respiratory distress    Abd soft, NT, ND Normal external genitalia with patent urethral meatus  Cystoscopy Procedure Note  Patient identification was confirmed, informed consent was obtained, and patient was prepped using Betadine solution.  Lidocaine jelly was administered per urethral meatus.    Preoperative abx where received prior to procedure.    Procedure: - Flexible cystoscope introduced, without any difficulty.   - Thorough search of the bladder revealed:    Pipe like urethral meatus Mildly trabeculated bladder Multiple diffuse patches of bladder erythema with ulceration consistent with known radiation changes, stable No active or viable tumor appreciated Trigone distorted  Post-Procedure: - Cystoscopy well tolerated today  Assessment/ Plan:  1.  History of bladder cancer/bladder lesions NED today- bladder c/w radiation changes, stable Continue surveillance cystoscopy, continue q6 month in absence of recurrence Followed closely at the cancer center, due for repeat imaging in Dec - Urinalysis, Complete - ciprofloxacin (CIPRO) tablet 500 mg; Take 1 tablet (500 mg total) by mouth once. - lidocaine (XYLOCAINE) 2 % jelly 1 application; Place 1 application into the urethra once. - Microscopic Examination  2. 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 Gross hematuria Likely secondary to #3  5. Anuria  and oliguria Strained to void with very little urine output likely secondary to very little urine output    Return in about 6 months (around 08/31/2019) for cysto.   Hollice Espy, MD

## 2019-03-13 ENCOUNTER — Ambulatory Visit: Payer: Medicare Other | Admitting: Podiatry

## 2019-03-23 ENCOUNTER — Ambulatory Visit: Payer: Medicare Other | Admitting: Podiatry

## 2019-04-07 DIAGNOSIS — M1612 Unilateral primary osteoarthritis, left hip: Secondary | ICD-10-CM | POA: Insufficient documentation

## 2019-05-11 ENCOUNTER — Emergency Department: Payer: Medicare Other

## 2019-05-11 ENCOUNTER — Inpatient Hospital Stay
Admission: EM | Admit: 2019-05-11 | Discharge: 2019-05-26 | DRG: 871 | Disposition: A | Discharge status: Skilled Nursing Facility | Payer: Medicare Other | Attending: Internal Medicine | Admitting: Internal Medicine

## 2019-05-11 ENCOUNTER — Encounter: Payer: Self-pay | Admitting: Emergency Medicine

## 2019-05-11 ENCOUNTER — Other Ambulatory Visit: Payer: Self-pay

## 2019-05-11 DIAGNOSIS — J181 Lobar pneumonia, unspecified organism: Secondary | ICD-10-CM

## 2019-05-11 DIAGNOSIS — J9601 Acute respiratory failure with hypoxia: Secondary | ICD-10-CM | POA: Diagnosis present

## 2019-05-11 DIAGNOSIS — Z79899 Other long term (current) drug therapy: Secondary | ICD-10-CM | POA: Diagnosis not present

## 2019-05-11 DIAGNOSIS — A419 Sepsis, unspecified organism: Secondary | ICD-10-CM | POA: Diagnosis present

## 2019-05-11 DIAGNOSIS — Z992 Dependence on renal dialysis: Secondary | ICD-10-CM | POA: Diagnosis not present

## 2019-05-11 DIAGNOSIS — Z90710 Acquired absence of both cervix and uterus: Secondary | ICD-10-CM

## 2019-05-11 DIAGNOSIS — J449 Chronic obstructive pulmonary disease, unspecified: Secondary | ICD-10-CM | POA: Diagnosis not present

## 2019-05-11 DIAGNOSIS — J440 Chronic obstructive pulmonary disease with acute lower respiratory infection: Secondary | ICD-10-CM | POA: Diagnosis present

## 2019-05-11 DIAGNOSIS — B9620 Unspecified Escherichia coli [E. coli] as the cause of diseases classified elsewhere: Secondary | ICD-10-CM | POA: Diagnosis present

## 2019-05-11 DIAGNOSIS — R6520 Severe sepsis without septic shock: Secondary | ICD-10-CM | POA: Diagnosis not present

## 2019-05-11 DIAGNOSIS — A4189 Other specified sepsis: Principal | ICD-10-CM | POA: Diagnosis present

## 2019-05-11 DIAGNOSIS — Z9889 Other specified postprocedural states: Secondary | ICD-10-CM | POA: Diagnosis not present

## 2019-05-11 DIAGNOSIS — B966 Bacteroides fragilis [B. fragilis] as the cause of diseases classified elsewhere: Secondary | ICD-10-CM | POA: Diagnosis not present

## 2019-05-11 DIAGNOSIS — A498 Other bacterial infections of unspecified site: Secondary | ICD-10-CM

## 2019-05-11 DIAGNOSIS — Z8551 Personal history of malignant neoplasm of bladder: Secondary | ICD-10-CM | POA: Diagnosis not present

## 2019-05-11 DIAGNOSIS — Y842 Radiological procedure and radiotherapy as the cause of abnormal reaction of the patient, or of later complication, without mention of misadventure at the time of the procedure: Secondary | ICD-10-CM | POA: Diagnosis present

## 2019-05-11 DIAGNOSIS — E785 Hyperlipidemia, unspecified: Secondary | ICD-10-CM | POA: Diagnosis present

## 2019-05-11 DIAGNOSIS — Z923 Personal history of irradiation: Secondary | ICD-10-CM | POA: Diagnosis not present

## 2019-05-11 DIAGNOSIS — Z515 Encounter for palliative care: Secondary | ICD-10-CM | POA: Diagnosis not present

## 2019-05-11 DIAGNOSIS — I2510 Atherosclerotic heart disease of native coronary artery without angina pectoris: Secondary | ICD-10-CM | POA: Diagnosis present

## 2019-05-11 DIAGNOSIS — E1122 Type 2 diabetes mellitus with diabetic chronic kidney disease: Secondary | ICD-10-CM | POA: Diagnosis present

## 2019-05-11 DIAGNOSIS — D631 Anemia in chronic kidney disease: Secondary | ICD-10-CM | POA: Diagnosis present

## 2019-05-11 DIAGNOSIS — E1140 Type 2 diabetes mellitus with diabetic neuropathy, unspecified: Secondary | ICD-10-CM | POA: Diagnosis present

## 2019-05-11 DIAGNOSIS — B9689 Other specified bacterial agents as the cause of diseases classified elsewhere: Secondary | ICD-10-CM | POA: Diagnosis not present

## 2019-05-11 DIAGNOSIS — N2581 Secondary hyperparathyroidism of renal origin: Secondary | ICD-10-CM | POA: Diagnosis present

## 2019-05-11 DIAGNOSIS — K219 Gastro-esophageal reflux disease without esophagitis: Secondary | ICD-10-CM | POA: Diagnosis present

## 2019-05-11 DIAGNOSIS — Z23 Encounter for immunization: Secondary | ICD-10-CM

## 2019-05-11 DIAGNOSIS — Z7189 Other specified counseling: Secondary | ICD-10-CM | POA: Diagnosis not present

## 2019-05-11 DIAGNOSIS — K900 Celiac disease: Secondary | ICD-10-CM | POA: Diagnosis present

## 2019-05-11 DIAGNOSIS — G9340 Encephalopathy, unspecified: Secondary | ICD-10-CM | POA: Diagnosis not present

## 2019-05-11 DIAGNOSIS — Z833 Family history of diabetes mellitus: Secondary | ICD-10-CM | POA: Diagnosis not present

## 2019-05-11 DIAGNOSIS — N3090 Cystitis, unspecified without hematuria: Secondary | ICD-10-CM | POA: Diagnosis not present

## 2019-05-11 DIAGNOSIS — I120 Hypertensive chronic kidney disease with stage 5 chronic kidney disease or end stage renal disease: Secondary | ICD-10-CM | POA: Diagnosis present

## 2019-05-11 DIAGNOSIS — Z87891 Personal history of nicotine dependence: Secondary | ICD-10-CM

## 2019-05-11 DIAGNOSIS — Z9981 Dependence on supplemental oxygen: Secondary | ICD-10-CM

## 2019-05-11 DIAGNOSIS — Z9221 Personal history of antineoplastic chemotherapy: Secondary | ICD-10-CM

## 2019-05-11 DIAGNOSIS — N321 Vesicointestinal fistula: Secondary | ICD-10-CM

## 2019-05-11 DIAGNOSIS — R400 Somnolence: Secondary | ICD-10-CM

## 2019-05-11 DIAGNOSIS — G4733 Obstructive sleep apnea (adult) (pediatric): Secondary | ICD-10-CM | POA: Diagnosis present

## 2019-05-11 DIAGNOSIS — R7881 Bacteremia: Secondary | ICD-10-CM

## 2019-05-11 DIAGNOSIS — N186 End stage renal disease: Secondary | ICD-10-CM

## 2019-05-11 DIAGNOSIS — N3040 Irradiation cystitis without hematuria: Secondary | ICD-10-CM | POA: Diagnosis present

## 2019-05-11 DIAGNOSIS — Z20828 Contact with and (suspected) exposure to other viral communicable diseases: Secondary | ICD-10-CM | POA: Diagnosis present

## 2019-05-11 DIAGNOSIS — G9341 Metabolic encephalopathy: Secondary | ICD-10-CM

## 2019-05-11 DIAGNOSIS — G4730 Sleep apnea, unspecified: Secondary | ICD-10-CM | POA: Diagnosis not present

## 2019-05-11 DIAGNOSIS — N1330 Unspecified hydronephrosis: Secondary | ICD-10-CM | POA: Diagnosis present

## 2019-05-11 LAB — CBC WITH DIFFERENTIAL/PLATELET
Abs Immature Granulocytes: 0.06 10*3/uL (ref 0.00–0.07)
Basophils Absolute: 0 10*3/uL (ref 0.0–0.1)
Basophils Relative: 0 %
Eosinophils Absolute: 0 10*3/uL (ref 0.0–0.5)
Eosinophils Relative: 0 %
HCT: 36.3 % (ref 36.0–46.0)
Hemoglobin: 11.3 g/dL — ABNORMAL LOW (ref 12.0–15.0)
Immature Granulocytes: 0 %
Lymphocytes Relative: 6 %
Lymphs Abs: 0.8 10*3/uL (ref 0.7–4.0)
MCH: 30.8 pg (ref 26.0–34.0)
MCHC: 31.1 g/dL (ref 30.0–36.0)
MCV: 98.9 fL (ref 80.0–100.0)
Monocytes Absolute: 0.7 10*3/uL (ref 0.1–1.0)
Monocytes Relative: 5 %
Neutro Abs: 12 10*3/uL — ABNORMAL HIGH (ref 1.7–7.7)
Neutrophils Relative %: 89 %
Platelets: 199 10*3/uL (ref 150–400)
RBC: 3.67 MIL/uL — ABNORMAL LOW (ref 3.87–5.11)
RDW: 13.4 % (ref 11.5–15.5)
WBC: 13.6 10*3/uL — ABNORMAL HIGH (ref 4.0–10.5)
nRBC: 0 % (ref 0.0–0.2)

## 2019-05-11 LAB — COMPREHENSIVE METABOLIC PANEL
ALT: 8 U/L (ref 0–44)
AST: 13 U/L — ABNORMAL LOW (ref 15–41)
Albumin: 3.5 g/dL (ref 3.5–5.0)
Alkaline Phosphatase: 89 U/L (ref 38–126)
Anion gap: 13 (ref 5–15)
BUN: 27 mg/dL — ABNORMAL HIGH (ref 8–23)
CO2: 25 mmol/L (ref 22–32)
Calcium: 8.6 mg/dL — ABNORMAL LOW (ref 8.9–10.3)
Chloride: 99 mmol/L (ref 98–111)
Creatinine, Ser: 7.49 mg/dL — ABNORMAL HIGH (ref 0.44–1.00)
GFR calc Af Amer: 5 mL/min — ABNORMAL LOW (ref >60)
GFR calc non Af Amer: 5 mL/min — ABNORMAL LOW (ref >60)
Glucose, Bld: 109 mg/dL — ABNORMAL HIGH (ref 70–99)
Potassium: 3.5 mmol/L (ref 3.5–5.1)
Sodium: 137 mmol/L (ref 135–145)
Total Bilirubin: 0.8 mg/dL (ref 0.3–1.2)
Total Protein: 8.8 g/dL — ABNORMAL HIGH (ref 6.5–8.1)

## 2019-05-11 LAB — LACTIC ACID, PLASMA: Lactic Acid, Venous: 0.9 mmol/L (ref 0.5–1.9)

## 2019-05-11 LAB — GLUCOSE, CAPILLARY: Glucose-Capillary: 102 mg/dL — ABNORMAL HIGH (ref 70–99)

## 2019-05-11 LAB — PROCALCITONIN: Procalcitonin: 0.88 ng/mL

## 2019-05-11 LAB — SARS CORONAVIRUS 2 BY RT PCR (HOSPITAL ORDER, PERFORMED IN ~~LOC~~ HOSPITAL LAB): SARS Coronavirus 2: NEGATIVE

## 2019-05-11 MED ORDER — IOHEXOL 300 MG/ML  SOLN: 100.0000 mL | Freq: Once | INTRAMUSCULAR | Status: DC | PRN

## 2019-05-11 MED ORDER — CALCIUM CARBONATE ANTACID 500 MG PO CHEW
1.0000 | CHEWABLE_TABLET | Freq: Every day | ORAL | Status: DC
  Filled 2019-05-11: qty 1

## 2019-05-11 MED ORDER — OXYBUTYNIN CHLORIDE 5 MG PO TABS
5.0000 mg | ORAL_TABLET | Freq: Three times a day (TID) | ORAL | Status: DC
  Filled 2019-05-11: qty 1

## 2019-05-11 MED ORDER — HEPARIN SODIUM (PORCINE) 5000 UNIT/ML IJ SOLN
5000.0000 [IU] | Freq: Three times a day (TID) | INTRAMUSCULAR | Status: DC
  Administered 2019-05-11 – 2019-05-26 (×40): 5000 [IU] via SUBCUTANEOUS
  Filled 2019-05-11 (×41): qty 1

## 2019-05-11 MED ORDER — LOSARTAN POTASSIUM 50 MG PO TABS: 50.0000 mg | ORAL_TABLET | Freq: Every evening | ORAL | Status: DC

## 2019-05-11 MED ORDER — PANTOPRAZOLE SODIUM 40 MG PO TBEC
40.0000 mg | DELAYED_RELEASE_TABLET | Freq: Every day | ORAL | Status: DC
  Filled 2019-05-11: qty 1

## 2019-05-11 MED ORDER — LOPERAMIDE HCL 2 MG PO CAPS: 2.0000 mg | ORAL_CAPSULE | ORAL | Status: DC | PRN

## 2019-05-11 MED ORDER — FERROUS SULFATE 325 (65 FE) MG PO TABS
325.0000 mg | ORAL_TABLET | Freq: Every day | ORAL | Status: DC
  Filled 2019-05-11: qty 1

## 2019-05-11 MED ORDER — LIDOCAINE HCL (PF) 1 % IJ SOLN
INTRAMUSCULAR | Status: AC
  Filled 2019-05-11: qty 5

## 2019-05-11 MED ORDER — VANCOMYCIN HCL IN DEXTROSE 1-5 GM/200ML-% IV SOLN
1000.0000 mg | Freq: Once | INTRAVENOUS | Status: AC
  Administered 2019-05-11: 1000 mg via INTRAVENOUS
  Filled 2019-05-11: qty 200

## 2019-05-11 MED ORDER — ALBUTEROL SULFATE (2.5 MG/3ML) 0.083% IN NEBU: 2.5000 mg | INHALATION_SOLUTION | Freq: Four times a day (QID) | RESPIRATORY_TRACT | Status: DC | PRN

## 2019-05-11 MED ORDER — GABAPENTIN 300 MG PO CAPS
600.0000 mg | ORAL_CAPSULE | Freq: Three times a day (TID) | ORAL | Status: DC
  Filled 2019-05-11: qty 2

## 2019-05-11 MED ORDER — VANCOMYCIN HCL 500 MG IV SOLR
500.0000 mg | Freq: Once | INTRAVENOUS | Status: AC
  Administered 2019-05-11: 13:00:00 500 mg via INTRAVENOUS
  Filled 2019-05-11: qty 500

## 2019-05-11 MED ORDER — CALCIUM ACETATE (PHOS BINDER) 667 MG PO CAPS: 1334.0000 mg | ORAL_CAPSULE | Freq: Two times a day (BID) | ORAL | Status: DC | PRN

## 2019-05-11 MED ORDER — VITAMIN D 25 MCG (1000 UNIT) PO TABS
1000.0000 [IU] | ORAL_TABLET | Freq: Every day | ORAL | Status: DC
  Filled 2019-05-11: qty 1

## 2019-05-11 MED ORDER — LABETALOL HCL 5 MG/ML IV SOLN
10.0000 mg | Freq: Once | INTRAVENOUS | Status: AC
  Administered 2019-05-11: 10 mg via INTRAVENOUS
  Filled 2019-05-11: qty 4

## 2019-05-11 MED ORDER — DIAZEPAM 2 MG PO TABS: 2.0000 mg | ORAL_TABLET | Freq: Two times a day (BID) | ORAL | Status: DC | PRN

## 2019-05-11 MED ORDER — ACETAMINOPHEN 500 MG PO TABS: 1000.0000 mg | ORAL_TABLET | Freq: Two times a day (BID) | ORAL | Status: DC | PRN

## 2019-05-11 MED ORDER — SODIUM CHLORIDE 0.9 % IV SOLN
500.0000 mg | INTRAVENOUS | Status: DC
  Administered 2019-05-11: 500 mg via INTRAVENOUS
  Filled 2019-05-11: qty 500

## 2019-05-11 MED ORDER — MIRABEGRON ER 25 MG PO TB24
25.0000 mg | ORAL_TABLET | Freq: Every day | ORAL | Status: DC
  Filled 2019-05-11: qty 1

## 2019-05-11 MED ORDER — ACETAMINOPHEN 650 MG RE SUPP
650.0000 mg | Freq: Once | RECTAL | Status: AC
  Administered 2019-05-11: 650 mg via RECTAL
  Filled 2019-05-11: qty 1

## 2019-05-11 MED ORDER — ATORVASTATIN CALCIUM 20 MG PO TABS: 40.0000 mg | ORAL_TABLET | Freq: Every evening | ORAL | Status: DC

## 2019-05-11 MED ORDER — SODIUM CHLORIDE 0.9 % IV SOLN
2.0000 g | Freq: Once | INTRAVENOUS | Status: AC
  Administered 2019-05-11: 12:00:00 2 g via INTRAVENOUS
  Filled 2019-05-11: qty 2

## 2019-05-11 MED ORDER — IBUPROFEN 400 MG PO TABS: 200.0000 mg | ORAL_TABLET | Freq: Four times a day (QID) | ORAL | Status: DC | PRN

## 2019-05-11 MED ORDER — SODIUM CHLORIDE 0.9 % IV SOLN
2.0000 g | INTRAVENOUS | Status: DC
  Administered 2019-05-11: 2 g via INTRAVENOUS
  Filled 2019-05-11: qty 20

## 2019-05-11 MED ORDER — CINACALCET HCL 30 MG PO TABS
30.0000 mg | ORAL_TABLET | Freq: Every day | ORAL | Status: DC
  Filled 2019-05-11: qty 1

## 2019-05-11 MED ORDER — LIDOCAINE HCL (PF) 1 % IJ SOLN: 5.0000 mL | Freq: Once | INTRAMUSCULAR | Status: DC

## 2019-05-11 MED ORDER — CALCIUM ACETATE (PHOS BINDER) 667 MG PO CAPS
1334.0000 mg | ORAL_CAPSULE | Freq: Three times a day (TID) | ORAL | Status: DC
  Filled 2019-05-11: qty 2

## 2019-05-11 MED ORDER — FUROSEMIDE 40 MG PO TABS
40.0000 mg | ORAL_TABLET | Freq: Two times a day (BID) | ORAL | Status: DC
  Filled 2019-05-11: qty 1

## 2019-05-11 NOTE — ED Notes (Addendum)
This RN and Joellen Jersey, RN transported patient over to CT. While in CT, pt's IV placed [place] noted to be infiltrated. This RN attemtped x 3 for IV access without success. Per [place], had already spoken with MD and changed order to CT without contrast due to no IV access. This RN returned to room with patient, primary RN Metta Clines made aware of infiltration into R upper arm and that abx were stopped while in CT.   Pt transported back to room without further incident. Metta Clines, Primary RN and Dr. Quentin Cornwall made aware that patient no longer has IV access and that patient returned to room.

## 2019-05-11 NOTE — ED Notes (Signed)
Unable to establish IV by this RN and oncoming staff. MD made aware with will attempt with [place]

## 2019-05-11 NOTE — ED Notes (Signed)
Dianne Dun (pt's spouse) called at number listed in demographics to update. Updated.

## 2019-05-11 NOTE — ED Notes (Signed)
Pt will open eyes when commanded but won't keep open for more than about 30 seconds to a minute. Pt will not attempt to raise eyebrows, puff out cheeks, pucker lips, or stick tongue out. Face doesn't appear completely symmetrical. Unsure if this is pt's norm and no family at bedside to consult.

## 2019-05-11 NOTE — ED Notes (Signed)
Urine sample sent to lab

## 2019-05-11 NOTE — ED Notes (Signed)
Thrill/bruit to dialysis access at L upper arm.

## 2019-05-11 NOTE — ED Notes (Signed)
Pt will nod "yes" to some questions.

## 2019-05-11 NOTE — ED Notes (Signed)
This RN looked for IV access without success.

## 2019-05-11 NOTE — ED Notes (Signed)
Phlebotomy at bedside.

## 2019-05-11 NOTE — ED Notes (Signed)
Lidocaine given to [place].

## 2019-05-11 NOTE — Progress Notes (Addendum)
PHARMACY -  BRIEF ANTIBIOTIC NOTE   Pharmacy has received consult(s) for cefepime and vancomycin from an ED provider.  The patient's profile has been reviewed for ht/wt/allergies/indication/available labs.    One time order(s) placed for  -vancomycin 1000 mg + 500 mg (total LD of 1500 mg [18 mg/kg- dialysis patient]) -cefepime 2 g  Further antibiotics/pharmacy consults should be ordered by admitting physician if indicated.                       Thank you, [place] Resident 05/11/2019  11:10 AM

## 2019-05-11 NOTE — ED Notes (Addendum)
CT staff asked this RN about transport to CT. Oxygen needs. Stated pt will be leaving for CT soon. Pt given more warm blankets.

## 2019-05-11 NOTE — ED Notes (Signed)
Jeneen Rinks (pt's husband) update.

## 2019-05-11 NOTE — ED Provider Notes (Signed)
[facility] Emergency Department Provider Note    None    (approximate)  I have reviewed the triage vital signs and the nursing notes.   HISTORY  Chief Complaint Weakness  Level V Caveat:  AMS - sepsis  HPI Barbara [name] is a 78 y.o. female presents the ER for evaluation of confusion fatigue shortness of breath and abdominal pain.  Patient is ill-appearing.  Unable to provide much additional history.  Reportedly is a dialysis patient but did get dialysis yesterday.  Does not reportedly wear home O2 was found to be hypoxic to the low 80s on room air.    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  dr. Candiss Norse and dr. Holley Raring follow her   COPD (chronic obstructive pulmonary disease) ([place])    Coronary artery disease    Diabetes mellitus without complication [facility])    Dialysis patient ([place])    Tues, Thurs, Sat   Dyspnea    with exertion   Elevated lipids    GERD (gastroesophageal reflux disease)    Hematuria    Hypertension    Lower back pain    Neuropathy    Oxygen dependent    at hs   Personal history of chemotherapy 2017   bladder ca   Personal history of radiation therapy 2017   bladder ca   Sleep apnea    uses cpap   Urinary obstruction 01/2016   Family History  Problem Relation Age of Onset   Diabetes Mother    Cancer Father    Breast cancer Neg Hx    Kidney disease Neg Hx    Past Surgical History:  Procedure Laterality Date   A/V SHUNT INTERVENTION N/A 05/12/2018   Procedure: A/V SHUNT INTERVENTION;  Surgeon: Algernon Huxley, MD;  Location: [place] [facility];  Service: Cardiovascular;  Laterality: N/A;   A/V SHUNT INTERVENTION Left 07/29/2018   Procedure: A/V  SHUNT INTERVENTION;  Surgeon: Katha Cabal, MD;  Location: [place] [facility];  Service: Cardiovascular;  Laterality: Left;   ABDOMINAL HYSTERECTOMY     AV FISTULA PLACEMENT Left 02/17/2017   Procedure: INSERTION OF ARTERIOVENOUS (AV) GORE-TEX GRAFT ARM(BRACHIAL AXILLARY);  Surgeon: Katha Cabal, MD;  Location: [facility];  Service: Vascular;  Laterality: Left;   CYSTOSCOPY W/ RETROGRADES Bilateral 02/17/2016   Procedure: CYSTOSCOPY WITH RETROGRADE PYELOGRAM;  Surgeon: Hollice Espy, MD;  Location: [facility];  Service: Urology;  Laterality: Bilateral;   CYSTOSCOPY W/ RETROGRADES Bilateral 10/12/2016   Procedure: CYSTOSCOPY WITH RETROGRADE PYELOGRAM;  Surgeon: Hollice Espy, MD;  Location: [facility];  Service: Urology;  Laterality: Bilateral;   CYSTOSCOPY W/ RETROGRADES Bilateral 06/09/2017   Procedure: CYSTOSCOPY WITH RETROGRADE PYELOGRAM;  Surgeon: Hollice Espy, MD;  Location: [facility];  Service: Urology;  Laterality: Bilateral;   CYSTOSCOPY W/ RETROGRADES Bilateral 10/07/2017   Procedure: CYSTOSCOPY WITH RETROGRADE PYELOGRAM;  Surgeon: Hollice Espy, MD;  Location: [facility];  Service: Urology;  Laterality: Bilateral;   CYSTOSCOPY W/ URETERAL STENT PLACEMENT Left 05/12/2016   Procedure: CYSTOSCOPY WITH STENT REPLACEMENT;  Surgeon: Hollice Espy, MD;  Location: [facility];  Service: Urology;  Laterality: Left;   CYSTOSCOPY W/ URETERAL  STENT PLACEMENT Left 10/12/2016   Procedure: CYSTOSCOPY WITH STENT REPLACEMENT;  Surgeon: Hollice Espy, MD;  Location: [facility];  Service: Urology;  Laterality: Left;   CYSTOSCOPY WITH BIOPSY N/A 06/09/2017   Procedure: CYSTOSCOPY WITH BLADDER BIOPSY;  Surgeon: Hollice Espy, MD;  Location: [facility];  Service: Urology;  Laterality: N/A;   CYSTOSCOPY WITH STENT PLACEMENT Left 01/21/2016   Procedure: CYSTOSCOPY WITH double J STENT PLACEMENT;  Surgeon: Franchot Gallo, MD;  Location: [facility];  Service: Urology;  Laterality: Left;   CYSTOSCOPY  WITH STENT PLACEMENT Right 10/12/2016   Procedure: CYSTOSCOPY WITH STENT PLACEMENT;  Surgeon: Hollice Espy, MD;  Location: [facility];  Service: Urology;  Laterality: Right;   DIALYSIS/PERMA CATHETER INSERTION N/A 11/20/2016   Procedure: Dialysis/Perma Catheter Insertion;  Surgeon: Algernon Huxley, MD;  Location: [place] [facility];  Service: Cardiovascular;  Laterality: N/A;   DIALYSIS/PERMA CATHETER REMOVAL N/A 03/30/2017   Procedure: DIALYSIS/PERMA CATHETER REMOVAL;  Surgeon: Katha Cabal, MD;  Location: [place] [facility];  Service: Cardiovascular;  Laterality: N/A;   KIDNEY SURGERY  01/21/2016   IR NEPHROSTOMY PLACEMENT LEFT    PERIPHERAL VASCULAR CATHETERIZATION N/A 04/07/2016   Procedure: Glori Luis Cath Insertion;  Surgeon: Katha Cabal, MD;  Location: [place] [facility];  Service: Cardiovascular;  Laterality: N/A;   PERIPHERAL VASCULAR THROMBECTOMY Left 06/02/2017   Procedure: PERIPHERAL VASCULAR THROMBECTOMY;  Surgeon: Algernon Huxley, MD;  Location: [place] [facility];  Service: Cardiovascular;  Laterality: Left;   PERIPHERAL VASCULAR THROMBECTOMY Left 01/28/2018   Procedure: PERIPHERAL VASCULAR THROMBECTOMY;  Surgeon: Katha Cabal, MD;  Location: [place] [facility];  Service: Cardiovascular;  Laterality: Left;   PERIPHERAL VASCULAR THROMBECTOMY Left 03/14/2018   Procedure: PERIPHERAL VASCULAR THROMBECTOMY;  Surgeon: Algernon Huxley, MD;  Location: [place] [facility];  Service: Cardiovascular;  Laterality: Left;   PERIPHERAL VASCULAR THROMBECTOMY Left 03/16/2018   Procedure: PERIPHERAL VASCULAR THROMBECTOMY;  Surgeon: Katha Cabal, MD;  Location: [place] [facility];  Service: Cardiovascular;  Laterality: Left;   PORTA CATH REMOVAL N/A 02/07/2019   Procedure: PORTA CATH REMOVAL;  Surgeon: Katha Cabal, MD;  Location: [place] [facility];  Service: Cardiovascular;  Laterality: N/A;   PORTACATH PLACEMENT Right    ROTATOR CUFF REPAIR Left    TEMPORARY  DIALYSIS CATHETER N/A 05/11/2018   Procedure: TEMPORARY DIALYSIS CATHETER;  Surgeon: Katha Cabal, MD;  Location: [place] [facility];  Service: Cardiovascular;  Laterality: N/A;   TEMPORARY DIALYSIS CATHETER Left 07/27/2018   Procedure: TEMPORARY DIALYSIS CATHETER;  Surgeon: Katha Cabal, MD;  Location: [place] [facility];  Service: Cardiovascular;  Laterality: Left;   TRANSURETHRAL RESECTION OF BLADDER TUMOR N/A 02/17/2016   Procedure: TRANSURETHRAL RESECTION OF BLADDER TUMOR (TURBT)-LARGE;  Surgeon: Hollice Espy, MD;  Location: [facility];  Service: Urology;  Laterality: N/A;   TRANSURETHRAL RESECTION OF BLADDER TUMOR N/A 10/07/2017   Procedure: TRANSURETHRAL RESECTION OF BLADDER TUMOR (TURBT)-small;  Surgeon: Hollice Espy, MD;  Location: [facility];  Service: Urology;  Laterality: N/A;   URETEROSCOPY Left 02/17/2016   Procedure: URETEROSCOPY;  Surgeon: Hollice Espy, MD;  Location: [facility];  Service: Urology;  Laterality: Left;   URETEROSCOPY Right 10/12/2016   Procedure: URETEROSCOPY;  Surgeon: Hollice Espy, MD;  Location: [facility];  Service: Urology;  Laterality: Right;   Patient Active Problem List   Diagnosis Date Noted   PNA (pneumonia) 05/11/2019   Dialysis patient [facility])    Metabolic acidosis 11/57/2620   Complication from  renal dialysis device 01/28/2018   History of colonic polyps 07/30/2017   Presence of cardiac and vascular implant and graft    Fever    Sepsis ([place])    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) 09/17/2016   Hypoglycemia 06/04/2016   Hyperkalemia 05/12/2016   Protein-calorie malnutrition, severe 05/05/2016   Acute on chronic renal failure ([place]) 05/04/2016   Seizure ([place]) 04/17/2016   Palliative care by specialist    DNR (do not resuscitate) discussion    C. 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 difficile colitis 01/22/2016   Urinary obstruction 01/21/2016   COPD (chronic obstructive pulmonary disease) ([place]) 01/21/2016   Controlled type 2 diabetes mellitus with stage 4 chronic kidney disease, with long-term current use of insulin ([place]) 01/21/2016   Essential hypertension 01/21/2016   Acute renal failure superimposed on stage 4 chronic kidney disease ([place]) 01/20/2016   Anemia in chronic kidney disease 01/20/2016   Arthritis of knee, degenerative 05/10/2015   Knee strain 03/26/2015   Other intervertebral disc displacement, lumbar region 03/04/2015   Degenerative arthritis of lumbar spine 03/04/2015   HNP (herniated nucleus pulposus), lumbar 03/04/2015   Osteoarthritis of spine with radiculopathy, lumbar region 03/04/2015   Injury of tendon of upper extremity 11/12/2014   Atherosclerosis of abdominal aorta ([place]) 11/02/2014   Chronic kidney disease, stage IV (severe) ([place]) 11/02/2014   Obstructive apnea 11/02/2014   Complete rotator cuff rupture of left shoulder 10/05/2014   Arthritis of shoulder region, degenerative 10/05/2014   CAD in native artery 12/08/2013   Benign essential HTN 12/08/2013   Type 2 diabetes mellitus ([place]) 12/08/2013      Prior to Admission medications   Medication Sig Start Date End Date Taking? Authorizing Provider  atorvastatin (LIPITOR) 40 MG tablet Take 40 mg by mouth every evening. 05/01/19  Yes [provider]  calcium acetate (PHOSLO) 667  MG capsule Take 2 capsules (1,334 mg total) by mouth 3 (three) times daily with meals. Patient taking differently: Take 1,334 mg by mouth See admin instructions. Take 1334 mg with each meal and snack 11/23/16  Yes Fritzi Mandes, MD  calcium carbonate (TUMS - DOSED IN MG ELEMENTAL CALCIUM) 500 MG chewable tablet Chew 1 tablet by mouth daily.   Yes [provider]  cholecalciferol (VITAMIN D) 1000 units tablet Take 1,000 Units by mouth daily.   Yes [provider]  cinacalcet (SENSIPAR) 30 MG tablet Take 30 mg by mouth daily. With largest meal   Yes [provider]  esomeprazole (NEXIUM) 40 MG capsule Take 40 mg by mouth daily after breakfast.    Yes [provider]  ferrous sulfate 325 (65 FE) MG EC tablet Take 325 mg by mouth daily after breakfast.    Yes [provider]  furosemide (LASIX) 40 MG tablet Take 40 mg by mouth 2 (two) times daily.    Yes [provider]  gabapentin (NEURONTIN) 300  MG capsule Take 600 mg by mouth 3 (three) times daily.   Yes [provider]  losartan (COZAAR) 50 MG tablet Take 50 mg by mouth every evening. 01/09/19  Yes [provider]  mirabegron ER (MYRBETRIQ) 25 MG TB24 tablet Take 25 mg by mouth daily.  05/27/18  Yes [provider]  oxybutynin (DITROPAN) 5 MG tablet Take 5 mg by mouth 3 (three) times daily.   Yes [provider]  ACCU-CHEK AVIVA PLUS test strip 1 each by Other route daily. use as directed 01/26/17   [provider]  ACCU-CHEK SOFTCLIX LANCETS lancets INJECT 1 EACH AS DIRECTED ONCE DAILY. 05/28/17   [provider]  acetaminophen (TYLENOL) 500 MG tablet Take 1,000 mg by mouth 2 (two) times daily as needed for moderate pain or headache.     [provider]  albuterol (PROVENTIL HFA;VENTOLIN HFA) 108 (90 Base) MCG/ACT inhaler Inhale 2 puffs into the lungs every 6 (six) hours as needed for wheezing or shortness of breath.  05/16/18   [provider]  Blood Glucose Monitoring Suppl (ACCU-CHEK AVIVA PLUS) w/Device KIT (USE TO MONITOR BLOOD SUGARS.) 12/30/17   [provider]  diazepam (VALIUM) 2 MG tablet Take 2 mg by mouth every 12 (twelve) hours as needed for anxiety. 03/21/19   [provider]  glucose blood test strip USE AS DIRECTED ONCE DAILY 05/23/18   [provider]  ibuprofen (ADVIL) 200 MG tablet Take 200 mg by mouth every 6 (six) hours as needed for moderate pain.     [provider]  loperamide (IMODIUM) 1 MG/5ML solution Take 2 mg by mouth as needed for diarrhea or loose stools.    [provider]  NON FORMULARY Place 1 Units into the nose at bedtime. CPAP Time of use 2100-0600    [provider]  OXYGEN Inhale 2 L into the lungs at bedtime.    [provider]    Allergies Glipizide er and Percocet [oxycodone-acetaminophen]    Social History Social History   Tobacco Use   Smoking status: Former Smoker    Types: Cigarettes    Quit date: 07/20/2002    Years since quitting: 16.8   Smokeless tobacco: Never Used   Tobacco comment: 01/22/2016   "  quit smoking many years ago "  Substance Use Topics   Alcohol use: No   Drug use: No    Review of Systems Patient denies headaches, rhinorrhea, blurry vision, numbness, shortness of breath, chest pain, edema, cough, abdominal pain, nausea, vomiting, diarrhea, dysuria, fevers, rashes or hallucinations unless otherwise stated above in HPI. ____________________________________________   PHYSICAL EXAM:  VITAL SIGNS: Vitals:   05/11/19 1400 05/11/19 1406  BP:    Pulse: 91   Resp:  17  Temp:    SpO2: 95%     Constitutional: Alert, ill appearing in moderate respiratory distress Eyes: Conjunctivae are normal.  Head: Atraumatic. Nose: No congestion/rhinnorhea. Mouth/Throat: Mucous membranes are moist.   Neck: No stridor. Painless ROM.  Cardiovascular: Normal rate, regular rhythm. Grossly  normal heart sounds.  Good peripheral circulation. Respiratory: Normal respiratory effort.  No retractions. Lungs with diffuse rhonchi and crackles Gastrointestinal: Soft obese with diffuse ttp. No distention.  No CVA tenderness. Genitourinary: deferred Musculoskeletal: No lower extremity tenderness nor edema.  No joint effusions. Neurologic:  No gross focal neurologic deficits are appreciated. No facial droop Skin:  Skin is warm, dry and intact. No rash noted. Psychiatric: calm and cooperative  ____________________________________________  LABS (all labs ordered are listed, but only abnormal results are displayed)  Results for orders placed or performed during the hospital encounter of 05/11/19 (from the past 24 hour(s))  Lactic acid, plasma     Status: None   Collection Time: 05/11/19 10:55 AM  Result Value Ref Range   Lactic Acid, Venous 0.9 0.5 - 1.9 mmol/L  Comprehensive metabolic panel     Status: Abnormal   Collection Time: 05/11/19 10:55 AM  Result Value Ref Range   Sodium 137 135 - 145 mmol/L   Potassium 3.5 3.5 - 5.1 mmol/L   Chloride 99 98 - 111 mmol/L   CO2 25 22 - 32 mmol/L   Glucose, Bld 109 (H) 70 - 99 mg/dL   BUN 27 (H) 8 - 23 mg/dL   Creatinine, Ser 7.49 (H) 0.44 - 1.00 mg/dL   Calcium 8.6 (L) 8.9 - 10.3 mg/dL   Total Protein 8.8 (H) 6.5 - 8.1 g/dL   Albumin 3.5 3.5 - 5.0 g/dL   AST 13 (L) 15 - 41 U/L   ALT 8 0 - 44 U/L   Alkaline Phosphatase 89 38 - 126 U/L   Total Bilirubin 0.8 0.3 - 1.2 mg/dL   GFR calc non Af Amer 5 (L) >60 mL/min   GFR calc Af Amer 5 (L) >60 mL/min   Anion gap 13 5 - 15  CBC WITH DIFFERENTIAL     Status: Abnormal   Collection Time: 05/11/19 10:55 AM  Result Value Ref Range   WBC 13.6 (H) 4.0 - 10.5 K/uL   RBC 3.67 (L) 3.87 - 5.11 MIL/uL   Hemoglobin 11.3 (L) 12.0 - 15.0 g/dL   HCT 36.3 36.0 - 46.0 %   MCV 98.9 80.0 - 100.0 fL   MCH 30.8 26.0 - 34.0 pg   MCHC 31.1 30.0 - 36.0 g/dL   RDW 13.4 11.5 - 15.5 %   Platelets 199 150 -  400 K/uL   nRBC 0.0 0.0 - 0.2 %   Neutrophils Relative % 89 %   Neutro Abs 12.0 (H) 1.7 - 7.7 K/uL   Lymphocytes Relative 6 %   Lymphs Abs 0.8 0.7 - 4.0 K/uL   Monocytes Relative 5 %   Monocytes Absolute 0.7 0.1 - 1.0 K/uL   Eosinophils Relative 0 %   Eosinophils Absolute 0.0 0.0 - 0.5 K/uL   Basophils Relative 0 %   Basophils Absolute 0.0 0.0 - 0.1 K/uL   Immature Granulocytes 0 %   Abs Immature Granulocytes 0.06 0.00 - 0.07 K/uL  Procalcitonin     Status: None   Collection Time: 05/11/19 10:55 AM  Result Value Ref Range   Procalcitonin 0.88 ng/mL  SARS Coronavirus 2 by RT PCR (hospital order, performed in [place] hospital lab) Nasopharyngeal Nasopharyngeal Swab     Status: None   Collection Time: 05/11/19 12:01 PM   Specimen: Nasopharyngeal Swab  Result Value Ref Range   SARS Coronavirus 2 NEGATIVE NEGATIVE   ____________________________________________  EKG My review and personal interpretation at Time: 10:51   Indication: sob  Rate: 90  Rhythm: sinus Axis: normal Other: normal intervals, no stemi ____________________________________________  RADIOLOGY  I personally reviewed all radiographic images ordered to evaluate for the above acute complaints and reviewed radiology reports and findings.  These findings were personally discussed with the patient.  Please see medical record for radiology report.  ____________________________________________   PROCEDURES  Procedure(s) performed:  .Critical Care Performed by: Merlyn Lot, MD Authorized by: Merlyn Lot, MD  Critical care provider statement:    Critical care time (minutes):  30   Critical care time was exclusive of:  Separately billable procedures and treating other patients   Critical care was necessary to treat or prevent imminent or life-threatening deterioration of the following conditions:  Sepsis   Critical care was time spent personally by me on the following activities:  Development of  treatment plan with patient or surrogate, discussions with consultants, evaluation of patient's response to treatment, examination of patient, obtaining history from patient or surrogate, ordering and performing treatments and interventions, ordering and review of laboratory studies, ordering and review of radiographic studies, pulse oximetry, re-evaluation of patient's condition and review of old charts   Due to difficulty with obtaining IV access, a 20G peripheral IV catheter was inserted using US guidance into the RUE.  The site was prepped with chlorhexidine and allowed to dry.  The patient tolerated the procedure without any complications.    Critical Care performed: yes ____________________________________________   INITIAL IMPRESSION / ASSESSMENT AND PLAN / ED COURSE  Pertinent labs & imaging results that were available during my care of the patient were reviewed by me and considered in my medical decision making (see chart for details).   DDX: spesis, pna, uti, pyelo, cholecystitis, diverticulitis, covid  Barbara [name] is a 78 y.o. who presents to the ED with symptoms as described above.  She is febrile ill-appearing with hypoxic respiratory failure.  Does have some mild tenderness on exam.  Blood will be ordered for sepsis.  Clinical Course as of May 10 1430  Thu May 11, 2019  1122 Lactic acid, plasma [PR]  1218 Lactate normal.  Normotensive.  Still with hypoxic respiratory failure febrile.  Starting broad-spectrum antibiotics for presumed pneumonia.  Will test for Covid.  Given her abdominal pain do feel that CT imaging clinically indicated.   [PR]  0109 CT imaging does not show any evidence of acute intra-abdominal process.  Patient's IV did infiltrate for was a noncontrasted study.  This point do believe she is stable for medical admission.  Covid negative.   [PR]  1431 Lactate is normal.  She is normotensive.  Due to concern for inducing worsening hypoxemia and pulmonary edema  do feel we can continue to observe and hold off on IV fluid resuscitation at this time.  We will continue to monitor.   [PR]    Clinical Course User Index [PR] Merlyn Lot, MD    The patient was evaluated in Emergency Department today for the symptoms described in the history of present illness. He/she was evaluated in the context of the global COVID-19 pandemic, which necessitated consideration that the patient might be at risk for infection with the SARS-CoV-2 virus that causes COVID-19. Institutional protocols and algorithms that pertain to the evaluation of patients at risk for COVID-19 are in a state of rapid change based on information released by regulatory bodies including the CDC and federal and state organizations. These policies and algorithms were followed during the patient's care in the ED.  As part of my medical decision making, I reviewed the following data within the [place] notes reviewed and incorporated, Labs reviewed, notes from prior ED visits and [place] Controlled Substance Database   ____________________________________________   FINAL CLINICAL IMPRESSION(S) / ED DIAGNOSES  Final diagnoses:  Sepsis with acute hypoxic respiratory failure, due to unspecified organism, unspecified whether septic shock present ([place])      NEW MEDICATIONS STARTED DURING THIS VISIT:  New Prescriptions   No medications on file     Note:  This document was prepared using Dragon voice recognition software and may include unintentional dictation errors.    Merlyn Lot, MD 05/11/19 1432

## 2019-05-11 NOTE — Progress Notes (Signed)
CODE SEPSIS - PHARMACY COMMUNICATION  **Broad Spectrum Antibiotics should be administered within 1 hour of Sepsis diagnosis**  Time Code Sepsis Called/Page Received: 1055  Antibiotics Ordered: vancomycin and cefepime  Time of 1st antibiotic administration: 1147  Additional action taken by pharmacy: Spoke with RN at 1135. Waiting on lab to draw blood cultures before starting abx.   [facility] Resident 05/11/2019  11:13 AM

## 2019-05-11 NOTE — ED Notes (Signed)
Paged provider to notify of continued high BP and in request of IV PRN BP med as pt did not pass swallow screen. Awaiting new orders.

## 2019-05-11 NOTE — ED Triage Notes (Signed)
Pt arrives from home with complaints of weakness. Pt had dialysis yesterday. PT arrives on room air with a saturation of 81%. Pt placed on 3L via nasal canula. Pt tender in RUQ

## 2019-05-11 NOTE — H&P (Signed)
[facility] [place] NAME: Barbara [name]    MR#:  [id number]  DATE OF BIRTH:  11/26/1940  DATE OF ADMISSION:  05/11/2019  PRIMARY CARE PHYSICIAN: [name], MD   REQUESTING/REFERRING PHYSICIAN: Merlyn Lot  CHIEF COMPLAINT:   Chief Complaint  Patient presents with  . Weakness    HISTORY OF PRESENT ILLNESS: Barbara [name]  is a 78 y.o. female with a known history of end-stage renal disease, chronic kidney disease stage IV, COPD, coronary artery disease, diabetes type 2, GERD with history of sleep apnea who was seen in the emergency room with complaint of abdominal pain and shortness of breath.  Patient does not say much.  She is not able to provide me any review of systems.   PAST MEDICAL HISTORY:   Past Medical History:  Diagnosis Date  . Adult celiac disease   . Anemia associated with chronic renal failure 2017   blood transfusion last week 10/17  . Arthritis   . Asthma   . Cancer ([place]) 2017   bladder  . Chronic kidney disease   . 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  dr. Candiss Norse and dr. Holley Raring follow her  . COPD (chronic obstructive pulmonary disease) ([place])   . Coronary artery disease   . Diabetes mellitus without complication ([place])   . Dialysis patient ([place])    Tues, Thurs, Sat  . Dyspnea    with exertion  . Elevated lipids   . GERD (gastroesophageal reflux disease)   . Hematuria   . Hypertension   . Lower back pain   . Neuropathy   . Oxygen dependent    at hs  . Personal history of chemotherapy 2017   bladder ca  . Personal history of radiation therapy 2017   bladder ca  . Sleep apnea    uses cpap  . Urinary obstruction 01/2016    PAST SURGICAL HISTORY:  Past Surgical History:  Procedure Laterality Date  . A/V SHUNT INTERVENTION N/A 05/12/2018   Procedure: A/V SHUNT INTERVENTION;  Surgeon: Algernon Huxley, MD;  Location: [place];  Service: Cardiovascular;  Laterality: N/A;  . A/V  SHUNT INTERVENTION Left 07/29/2018   Procedure: A/V SHUNT INTERVENTION;  Surgeon: Katha Cabal, MD;  Location: [place] [facility];  Service: Cardiovascular;  Laterality: Left;  . ABDOMINAL HYSTERECTOMY    . AV FISTULA PLACEMENT Left 02/17/2017   Procedure: INSERTION OF ARTERIOVENOUS (AV) GORE-TEX GRAFT ARM(BRACHIAL AXILLARY);  Surgeon: Katha Cabal, MD;  Location: [facility];  Service: Vascular;  Laterality: Left;  . CYSTOSCOPY W/ RETROGRADES Bilateral 02/17/2016   Procedure: CYSTOSCOPY WITH RETROGRADE PYELOGRAM;  Surgeon: Hollice Espy, MD;  Location: [facility];  Service: Urology;  Laterality: Bilateral;  . CYSTOSCOPY W/ RETROGRADES Bilateral 10/12/2016   Procedure: CYSTOSCOPY WITH RETROGRADE PYELOGRAM;  Surgeon: Hollice Espy, MD;  Location: [facility];  Service: Urology;  Laterality: Bilateral;  . CYSTOSCOPY W/ RETROGRADES Bilateral 06/09/2017   Procedure: CYSTOSCOPY WITH RETROGRADE PYELOGRAM;  Surgeon: Hollice Espy, MD;  Location: [facility];  Service: Urology;  Laterality: Bilateral;  . CYSTOSCOPY W/ RETROGRADES Bilateral 10/07/2017   Procedure: CYSTOSCOPY WITH RETROGRADE PYELOGRAM;  Surgeon: Hollice Espy, MD;  Location: [facility];  Service: Urology;  Laterality: Bilateral;  . CYSTOSCOPY W/ URETERAL STENT PLACEMENT Left 05/12/2016   Procedure: CYSTOSCOPY WITH STENT REPLACEMENT;  Surgeon: Hollice Espy, MD;  Location: [facility];  Service: Urology;  Laterality: Left;  . CYSTOSCOPY W/ URETERAL STENT  PLACEMENT Left 10/12/2016   Procedure: CYSTOSCOPY WITH STENT REPLACEMENT;  Surgeon: Hollice Espy, MD;  Location: [facility];  Service: Urology;  Laterality: Left;  . CYSTOSCOPY WITH BIOPSY N/A 06/09/2017   Procedure: CYSTOSCOPY WITH BLADDER BIOPSY;  Surgeon: Hollice Espy, MD;  Location: [facility];  Service: Urology;  Laterality: N/A;  . CYSTOSCOPY WITH STENT PLACEMENT Left 01/21/2016   Procedure: CYSTOSCOPY WITH double J STENT PLACEMENT;  Surgeon: Franchot Gallo, MD;  Location: [facility];   Service: Urology;  Laterality: Left;  . CYSTOSCOPY WITH STENT PLACEMENT Right 10/12/2016   Procedure: CYSTOSCOPY WITH STENT PLACEMENT;  Surgeon: Hollice Espy, MD;  Location: [facility];  Service: Urology;  Laterality: Right;  . DIALYSIS/PERMA CATHETER INSERTION N/A 11/20/2016   Procedure: Dialysis/Perma Catheter Insertion;  Surgeon: Algernon Huxley, MD;  Location: [place] [facility];  Service: Cardiovascular;  Laterality: N/A;  . DIALYSIS/PERMA CATHETER REMOVAL N/A 03/30/2017   Procedure: DIALYSIS/PERMA CATHETER REMOVAL;  Surgeon: Katha Cabal, MD;  Location: [place] [facility];  Service: Cardiovascular;  Laterality: N/A;  . KIDNEY SURGERY  01/21/2016   IR NEPHROSTOMY PLACEMENT LEFT   . PERIPHERAL VASCULAR CATHETERIZATION N/A 04/07/2016   Procedure: Glori Luis Cath Insertion;  Surgeon: Katha Cabal, MD;  Location: [place] [facility];  Service: Cardiovascular;  Laterality: N/A;  . PERIPHERAL VASCULAR THROMBECTOMY Left 06/02/2017   Procedure: PERIPHERAL VASCULAR THROMBECTOMY;  Surgeon: Algernon Huxley, MD;  Location: [place] [facility];  Service: Cardiovascular;  Laterality: Left;  . PERIPHERAL VASCULAR THROMBECTOMY Left 01/28/2018   Procedure: PERIPHERAL VASCULAR THROMBECTOMY;  Surgeon: Katha Cabal, MD;  Location: [place] [facility];  Service: Cardiovascular;  Laterality: Left;  . PERIPHERAL VASCULAR THROMBECTOMY Left 03/14/2018   Procedure: PERIPHERAL VASCULAR THROMBECTOMY;  Surgeon: Algernon Huxley, MD;  Location: [place] [facility];  Service: Cardiovascular;  Laterality: Left;  . PERIPHERAL VASCULAR THROMBECTOMY Left 03/16/2018   Procedure: PERIPHERAL VASCULAR THROMBECTOMY;  Surgeon: Katha Cabal, MD;  Location: [place] [facility];  Service: Cardiovascular;  Laterality: Left;  . PORTA CATH REMOVAL N/A 02/07/2019   Procedure: PORTA CATH REMOVAL;  Surgeon: Katha Cabal, MD;  Location: [place] [facility];  Service: Cardiovascular;  Laterality: N/A;  . PORTACATH PLACEMENT  Right   . ROTATOR CUFF REPAIR Left   . TEMPORARY DIALYSIS CATHETER N/A 05/11/2018   Procedure: TEMPORARY DIALYSIS CATHETER;  Surgeon: Katha Cabal, MD;  Location: [place] [facility];  Service: Cardiovascular;  Laterality: N/A;  . TEMPORARY DIALYSIS CATHETER Left 07/27/2018   Procedure: TEMPORARY DIALYSIS CATHETER;  Surgeon: Katha Cabal, MD;  Location: [place] [facility];  Service: Cardiovascular;  Laterality: Left;  . TRANSURETHRAL RESECTION OF BLADDER TUMOR N/A 02/17/2016   Procedure: TRANSURETHRAL RESECTION OF BLADDER TUMOR (TURBT)-LARGE;  Surgeon: Hollice Espy, MD;  Location: [facility];  Service: Urology;  Laterality: N/A;  . TRANSURETHRAL RESECTION OF BLADDER TUMOR N/A 10/07/2017   Procedure: TRANSURETHRAL RESECTION OF BLADDER TUMOR (TURBT)-small;  Surgeon: Hollice Espy, MD;  Location: [facility];  Service: Urology;  Laterality: N/A;  . URETEROSCOPY Left 02/17/2016   Procedure: URETEROSCOPY;  Surgeon: Hollice Espy, MD;  Location: [facility];  Service: Urology;  Laterality: Left;  . URETEROSCOPY Right 10/12/2016   Procedure: URETEROSCOPY;  Surgeon: Hollice Espy, MD;  Location: [facility];  Service: Urology;  Laterality: Right;    SOCIAL HISTORY:  Social History   Tobacco Use  . Smoking status: Former Smoker    Types: Cigarettes    Quit date: 07/20/2002  Years since quitting: 16.8  . Smokeless tobacco: Never Used  . Tobacco comment: 01/22/2016   "  quit smoking many years ago "  Substance Use Topics  . Alcohol use: No    FAMILY HISTORY:  Family History  Problem Relation Age of Onset  . Diabetes Mother   . Cancer Father   . Breast cancer Neg Hx   . Kidney disease Neg Hx     DRUG ALLERGIES:  Allergies  Allergen Reactions  . Glipizide Er Other (See Comments)    Hypoglycemia   . Percocet [Oxycodone-Acetaminophen] Hives    REVIEW OF SYSTEMS:   CONSTITUTIONAL: Limited patient not answering questions  MEDICATIONS AT HOME:  Prior to Admission medications    Medication Sig Start Date End Date Taking? Authorizing Provider  atorvastatin (LIPITOR) 40 MG tablet Take 40 mg by mouth every evening. 05/01/19  Yes [provider]  calcium acetate (PHOSLO) 667 MG capsule Take 2 capsules (1,334 mg total) by mouth 3 (three) times daily with meals. Patient taking differently: Take 1,334 mg by mouth See admin instructions. Take 1334 mg with each meal and snack 11/23/16  Yes Fritzi Mandes, MD  calcium carbonate (TUMS - DOSED IN MG ELEMENTAL CALCIUM) 500 MG chewable tablet Chew 1 tablet by mouth daily.   Yes [provider]  cholecalciferol (VITAMIN D) 1000 units tablet Take 1,000 Units by mouth daily.   Yes [provider]  cinacalcet (SENSIPAR) 30 MG tablet Take 30 mg by mouth daily. With largest meal   Yes [provider]  esomeprazole (NEXIUM) 40 MG capsule Take 40 mg by mouth daily after breakfast.    Yes [provider]  ferrous sulfate 325 (65 FE) MG EC tablet Take 325 mg by mouth daily after breakfast.    Yes [provider]  furosemide (LASIX) 40 MG tablet Take 40 mg by mouth 2 (two) times daily.    Yes [provider]  gabapentin (NEURONTIN) 300 MG capsule Take 600 mg by mouth 3 (three) times daily.   Yes [provider]  losartan (COZAAR) 50 MG tablet Take 50 mg by mouth every evening. 01/09/19  Yes [provider]  mirabegron ER (MYRBETRIQ) 25 MG TB24 tablet Take 25 mg by mouth daily.  05/27/18  Yes [provider]  oxybutynin (DITROPAN) 5 MG tablet Take 5 mg by mouth 3 (three) times daily.   Yes [provider]  ACCU-CHEK AVIVA PLUS test strip 1 each by Other route daily. use as directed 01/26/17   [provider]  ACCU-CHEK SOFTCLIX LANCETS lancets INJECT 1 EACH AS DIRECTED ONCE DAILY. 05/28/17   [provider]  acetaminophen (TYLENOL) 500 MG tablet Take 1,000 mg by mouth 2 (two) times daily as needed for moderate pain or headache.     [provider]  albuterol (PROVENTIL HFA;VENTOLIN HFA) 108 (90 Base) MCG/ACT inhaler Inhale 2 puffs into the lungs every 6 (six) hours as needed for wheezing or shortness of breath.  05/16/18   [provider]  Blood Glucose Monitoring Suppl (ACCU-CHEK AVIVA PLUS) w/Device KIT (USE TO MONITOR BLOOD SUGARS.) 12/30/17   [provider]  diazepam (VALIUM) 2 MG tablet Take 2 mg by mouth every 12 (twelve) hours as needed for anxiety. 03/21/19   [provider]  glucose blood test strip USE AS DIRECTED ONCE DAILY 05/23/18   [provider]  ibuprofen (ADVIL) 200 MG tablet Take 200 mg by mouth every 6 (six) hours as needed for moderate pain.  [provider]  loperamide (IMODIUM) 1 MG/5ML solution Take 2 mg by mouth as needed for diarrhea or loose stools.    [provider]  NON FORMULARY Place 1 Units into the nose at bedtime. CPAP Time of use 2100-0600    [provider]  OXYGEN Inhale 2 L into the lungs at bedtime.    [provider]      PHYSICAL EXAMINATION:   VITAL SIGNS: Blood pressure (!) 198/76, pulse 91, temperature (!) 102.7 F (39.3 C), temperature source Oral, resp. rate (!) 21, height _0  (1.727 m), weight 82 kg, SpO2 98 %.  GENERAL:  78 y.o.-year-old patient lying in the bed with no acute distress.  EYES: Pupils equal, round, reactive to light and accommodation. No scleral icterus. Extraocular muscles intact.  HEENT: Head atraumatic, normocephalic. Oropharynx and nasopharynx clear.  NECK:  Supple, no jugular venous distention. No thyroid enlargement, no tenderness.  LUNGS: Rhonchus breath sounds bilaterally without any accessory muscle usage CARDIOVASCULAR: S1, S2 normal. No murmurs, rubs, or gallops.  ABDOMEN: Soft, nontender, nondistended. Bowel sounds present. No organomegaly or mass.  EXTREMITIES: No pedal edema, cyanosis, or clubbing.  NEUROLOGIC: Patient awake but not following any commands PSYCHIATRIC:  The patient is alert   SKIN: No obvious rash, lesion, or ulcer.   LABORATORY PANEL:   CBC Recent Labs  Lab 05/11/19 1055  WBC 13.6*  HGB 11.3*  HCT 36.3  PLT 199  MCV 98.9  MCH 30.8  MCHC 31.1  RDW 13.4  LYMPHSABS 0.8  MONOABS 0.7  EOSABS 0.0  BASOSABS 0.0   ------------------------------------------------------------------------------------------------------------------  Chemistries  Recent Labs  Lab 05/11/19 1055  NA 137  K 3.5  CL 99  CO2 25  GLUCOSE 109*  BUN 27*  CREATININE 7.49*  CALCIUM 8.6*  AST 13*  ALT 8  ALKPHOS 89  BILITOT 0.8   ------------------------------------------------------------------------------------------------------------------ estimated creatinine clearance is 6.8 mL/min (A) (by C-G formula based on SCr of 7.49 mg/dL (H)). ------------------------------------------------------------------------------------------------------------------ No results for input(s): TSH, T4TOTAL, T3FREE, THYROIDAB in the last 72 hours.  Invalid input(s): FREET3   Coagulation profile No results for input(s): INR, PROTIME in the last 168 hours. ------------------------------------------------------------------------------------------------------------------- No results for input(s): DDIMER in the last 72 hours. -------------------------------------------------------------------------------------------------------------------  Cardiac Enzymes No results for input(s): CKMB, TROPONINI, MYOGLOBIN in the last 168 hours.  Invalid input(s): CK ------------------------------------------------------------------------------------------------------------------ Invalid input(s): POCBNP  ---------------------------------------------------------------------------------------------------------------  Urinalysis    Component Value Date/Time   COLORURINE YELLOW (A) 01/30/2018 2055   APPEARANCEUR Cloudy (A) 02/28/2019 0902   LABSPEC 1.015 01/30/2018 2055    LABSPEC 1.012 08/02/2012 1114   PHURINE 7.0 01/30/2018 2055   GLUCOSEU Negative 02/28/2019 0902   GLUCOSEU Negative 08/02/2012 1114   HGBUR LARGE (A) 01/30/2018 2055   BILIRUBINUR Negative 02/28/2019 0902   BILIRUBINUR Negative 08/02/2012 1114   KETONESUR NEGATIVE 01/30/2018 2055   PROTEINUR 2+ (A) 02/28/2019 0902   PROTEINUR 100 (A) 01/30/2018 2055   NITRITE Negative 02/28/2019 0902   NITRITE NEGATIVE 01/30/2018 2055   LEUKOCYTESUR Trace (A) 02/28/2019 0902   LEUKOCYTESUR Negative 08/02/2012 1114     RADIOLOGY: Ct Abdomen Pelvis Wo Contrast  Result Date: 05/11/2019 CLINICAL DATA:  Abdominal pain, fever.  History of bladder cancer EXAM: CT ABDOMEN AND PELVIS WITHOUT CONTRAST TECHNIQUE: Multidetector CT imaging of the abdomen and pelvis was performed following the standard protocol without IV contrast. COMPARISON:  01/19/2018 FINDINGS: Lower chest: Small bilateral pleural effusions. Cardiomegaly. Bibasilar airspace opacities likely atelectasis. Hepatobiliary: No focal hepatic abnormality. Gallbladder unremarkable. Pancreas:  No focal abnormality or ductal dilatation. Spleen: No focal abnormality.  Normal size. Adrenals/Urinary Tract: Severe bilateral hydroureteronephrosis, unchanged. Bladder wall is markedly thickened and stable since prior study. No visible adrenal lesion. Stomach/Bowel: Stomach, large and small bowel grossly unremarkable. Vascular/Lymphatic: Heavily calcified aorta and iliac vessels. Right common iliac lymph node again noted with a short axis diameter of 1.5 cm, stable. Right inguinal lymph node has a short axis diameter of 7 mm, stable. Reproductive: Prior hysterectomy.  No adnexal masses. Other: No free fluid or free air. Musculoskeletal: No acute bony abnormality. IMPRESSION: Continued marked bladder wall thickening and severe bilateral hydronephrosis. Findings similar to prior study. Heavily calcified aorta and iliac vessels. New small bilateral pleural effusions. Bibasilar  opacities, likely atelectasis. Stable mildly enlarged and borderline sized right common iliac and right inguinal lymph nodes. Electronically Signed   By: Rolm Baptise M.D.   On: 05/11/2019 13:51   Dg Chest Port 1 View  Result Date: 05/11/2019 CLINICAL DATA:  Sepsis EXAM: PORTABLE CHEST 1 VIEW COMPARISON:  05/10/2018 FINDINGS: Cardiac shadow is mildly prominent but accentuated by the portable technique. Further stenting in the left innominate vein and left subclavian vein is noted. Right chest wall port is been removed in the interval. Diffuse vascular congestion is noted with mild interstitial edema. This may be related to a degree of volume overload related to the end-stage renal failure. No focal confluent infiltrate is seen. No sizable effusion is noted. No bony abnormality is noted. IMPRESSION: Vascular congestion likely related to the known renal failure and volume overload. No other focal abnormality is seen. Electronically Signed   By: Inez Catalina M.D.   On: 05/11/2019 12:11    EKG: Orders placed or performed during the hospital encounter of 05/11/19  . ED EKG 12-Lead  . ED EKG 12-Lead  . EKG 12-Lead  . EKG 12-Lead    IMPRESSION AND PLAN: Patient 78 year old presenting with abdominal pain confusion and hypoxia  1.  Community-acquired pneumonia we will treat with IV ceftriaxone azithromycin  2.  Acute encephalopathy likely due to #1 follow mental status  3.  End-stage renal disease nephrology has been consulted for hemodialysis continue PhosLo and Sensipar  4.  Diabetes type 2 we will place on sliding scale insulin   5.  Sleep apnea CPAP at bedtime  6.  GERD continue Nexium  5, hypertension continue Cozaar        All the records are reviewed and case discussed with ED provider. Management plans discussed with the patient, family and they are in agreement.  CODE STATUS: Code Status History    Date Active Date Inactive Code Status Order ID Comments User Context    07/27/2018 1819 07/30/2018 1640 Full Code [id number]  Sela Hua, MD Inpatient   05/10/2018 1831 05/13/2018 2249 Full Code [id number]  Hillary Bow, MD ED   01/24/2017 2348 01/29/2017 1614 Full Code [id number]  Johnson, Josephine, DO Inpatient   11/19/2016 1528 11/23/2016 2156 Full Code [id number]  Loletha Grayer, MD Inpatient   06/05/2016 0108 06/08/2016 1345 Full Code [id number]  Lance Coon, MD Inpatient   05/12/2016 1432 05/15/2016 1746 Full Code [id number]  Lytle Butte, MD Inpatient   05/04/2016 1455 05/09/2016 1828 Full Code [id number]  Epifanio Lesches, MD ED   04/17/2016 1336 04/20/2016 2041 Full Code [id number]  Demetrios Loll, MD Inpatient   03/04/2016 1358 03/10/2016 1440 Full Code [id number]  Hillary Bow, MD ED   01/21/2016 2115 01/27/2016 1801 Full Code [id number]  Danford, Suann Larry, MD  Inpatient   01/20/2016 1841 01/21/2016 2115 Full Code [id number]  Theodoro Grist, MD Inpatient   12/18/2015 0214 12/23/2015 1651 Full Code [id number]  Saundra Shelling, MD Inpatient   Advance Care Planning Activity       TOTAL TIME TAKING CARE OF THIS PATIENT: 71mnutes.    SDustin FlockM.D on 05/11/2019 at 2:07 PM  Between 7am to 6pm - Pager - [phone]  After 6pm go to [email or web address] - password [email or web address]  [phone] CC: Primary care physician; JHarrel Lemon MD

## 2019-05-11 NOTE — ED Notes (Signed)
Per Jinny Blossom RN current IV infiltrated. Pt back to room. EDP Quentin Cornwall notified. IV team consult placed.

## 2019-05-11 NOTE — ED Notes (Signed)
20g left upper arm IV removed due to infiltration, cold compresses applied per pharmacy recommendations.

## 2019-05-11 NOTE — ED Notes (Signed)
This Rn to collect I&O cath urine sample momentarily with Caitlyn NT assisting.

## 2019-05-11 NOTE — ED Notes (Addendum)
Attending Patel notified of pt's inc temp and that she did not pass swallow screen. Tylenol suppository order requested by this RN. Pt urinated and had BM. Peri care provided. Linens changed. Briefs changed. Pt repositioned again.

## 2019-05-11 NOTE — ED Notes (Signed)
Pt leaving for CT.

## 2019-05-11 NOTE — ED Notes (Signed)
IV team at bedside 

## 2019-05-11 NOTE — ED Notes (Signed)
Phlebotomy remains at bedside. Will start antibiotics soon.

## 2019-05-11 NOTE — ED Notes (Signed)
This RN called lab to assist with drawing 2nd set of cultures. Lab states a phlebotomist will be at bedside soon.

## 2019-05-11 NOTE — ED Notes (Signed)
Ice pack applied to site of infiltration. Arm raised on pillow. Antibiotics restarted at new IV line.

## 2019-05-11 NOTE — ED Notes (Signed)
Verbal from [place] to go ahead and hang antibiotics without 2nd set of cultures.

## 2019-05-11 NOTE — ED Notes (Signed)
EDP Robinson at bedside with ultrasound.

## 2019-05-11 NOTE — ED Notes (Signed)
Pharm messaged to reschedule vanc trough.

## 2019-05-12 ENCOUNTER — Inpatient Hospital Stay: Payer: Medicare Other

## 2019-05-12 LAB — BLOOD CULTURE ID PANEL (REFLEXED)

## 2019-05-12 LAB — GLUCOSE, CAPILLARY
Glucose-Capillary: 90 mg/dL (ref 70–99)
Glucose-Capillary: 92 mg/dL (ref 70–99)
Glucose-Capillary: 115 mg/dL — ABNORMAL HIGH (ref 70–99)

## 2019-05-12 LAB — MRSA PCR SCREENING: MRSA by PCR: POSITIVE — AB

## 2019-05-12 LAB — HEPATITIS B SURFACE ANTIGEN: Hepatitis B Surface Ag: NONREACTIVE

## 2019-05-12 MED ORDER — ACETAMINOPHEN 325 MG PO TABS
650.0000 mg | ORAL_TABLET | ORAL | Status: DC | PRN
  Filled 2019-05-12: qty 2

## 2019-05-12 MED ORDER — ACETAMINOPHEN 650 MG RE SUPP
650.0000 mg | RECTAL | Status: DC | PRN
  Administered 2019-05-12: 650 mg via RECTAL
  Filled 2019-05-12: qty 1

## 2019-05-12 MED ORDER — ACETAMINOPHEN 650 MG RE SUPP: 650.0000 mg | RECTAL | Status: DC | PRN

## 2019-05-12 MED ORDER — ACETAMINOPHEN 325 MG PO TABS: 650.0000 mg | ORAL_TABLET | ORAL | Status: DC | PRN

## 2019-05-12 MED ORDER — MUPIROCIN 2 % EX OINT
1.0000 "application " | TOPICAL_OINTMENT | Freq: Two times a day (BID) | CUTANEOUS | Status: DC
  Administered 2019-05-12 – 2019-05-25 (×18): 1 via NASAL
  Filled 2019-05-12: qty 22

## 2019-05-12 MED ORDER — VANCOMYCIN HCL IN DEXTROSE 1-5 GM/200ML-% IV SOLN: 1000.0000 mg | INTRAVENOUS | Status: DC

## 2019-05-12 MED ORDER — SODIUM CHLORIDE 0.9 % IV SOLN
1.0000 g | INTRAVENOUS | Status: DC
  Administered 2019-05-12 – 2019-05-14 (×3): 1 g via INTRAVENOUS
  Filled 2019-05-12 (×4): qty 1

## 2019-05-12 MED ORDER — INFLUENZA VAC A&B SA ADJ QUAD 0.5 ML IM PRSY
0.5000 mL | PREFILLED_SYRINGE | INTRAMUSCULAR | Status: DC
  Filled 2019-05-12 (×2): qty 0.5

## 2019-05-12 MED ORDER — VANCOMYCIN HCL IN DEXTROSE 1-5 GM/200ML-% IV SOLN
1000.0000 mg | Freq: Once | INTRAVENOUS | Status: AC
  Administered 2019-05-12: 1000 mg via INTRAVENOUS
  Filled 2019-05-12: qty 200

## 2019-05-12 MED ORDER — CHLORHEXIDINE GLUCONATE CLOTH 2 % EX PADS
6.0000 | MEDICATED_PAD | Freq: Every day | CUTANEOUS | Status: DC
  Administered 2019-05-13 – 2019-05-17 (×4): 6 via TOPICAL

## 2019-05-12 NOTE — Progress Notes (Signed)
HD Tx Completed:    05/12/19 1230  Vital Signs  Temp 98.1 F (36.7 C)  Temp Source Oral  Pulse Rate (!) 102  Pulse Rate Source Dinamap  Resp 19  BP 130/61  Oxygen Therapy  SpO2 98 %  O2 Device Nasal Cannula  O2 Flow Rate (L/min) 2 L/min  Pain Assessment  Pain Scale  (UTA, pt calm and cooperative)  During Hemodialysis Assessment  KECN 76.1 KECN  Dialysis Fluid Bolus Normal Saline  Bolus Amount (mL) 250 mL  Intra-Hemodialysis Comments Tx completed;Tolerated well

## 2019-05-12 NOTE — Progress Notes (Signed)
Post HD Assessment:    05/12/19 1231  Neurological  Level of Consciousness Alert  Orientation Level Oriented to person  Respiratory  Respiratory Pattern Regular;Unlabored  Cardiac  Pulse Regular  Vascular  R Radial Pulse +2  L Radial Pulse +2  Integumentary  Integumentary (WDL) X  Psychosocial  Psychosocial (WDL) X  Patient Behaviors Calm;Cooperative

## 2019-05-12 NOTE — Progress Notes (Signed)
Post HD Tx:    05/12/19 1234  Vital Signs  Temp 98.1 F (36.7 C)  Temp Source Oral  Pulse Rate 95  Pulse Rate Source Dinamap  Resp (!) 23  BP 136/68  BP Location Right Arm  BP Method Automatic  Patient Position (if appropriate) Lying  Oxygen Therapy  SpO2 99 %  O2 Device Nasal Cannula  O2 Flow Rate (L/min) 2 L/min  Post-Hemodialysis Assessment  Rinseback Volume (mL) 250 mL  KECN 76.1 V  Dialyzer Clearance Lightly streaked  Duration of HD Treatment -hour(s) 3.5 hour(s)  Hemodialysis Intake (mL) 500 mL  UF Total -Machine (mL) 3000 mL  Net UF (mL) 2500 mL  Tolerated HD Treatment Yes  AVG/AVF Arterial Site Held (minutes)  (5)  AVG/AVF Venous Site Held (minutes)  (5)  Fistula / Graft Left Upper arm Arteriovenous vein graft  Placement Date/Time: 02/17/17 1111   Orientation: Left  Access Location: Upper arm  Access Type: Arteriovenous vein graft  Site Condition No complications  Fistula / Graft Assessment Bruit;Thrill;Present  Status Deaccessed  Drainage Description None

## 2019-05-12 NOTE — Progress Notes (Signed)
Pre HD Assessment:    05/12/19 0851  Neurological  Level of Consciousness Alert  Orientation Level Oriented to person  Respiratory  Respiratory Pattern Regular;Unlabored  Cardiac  Pulse Regular  Vascular  R Radial Pulse +2  L Radial Pulse +2  Integumentary  Integumentary (WDL) X  Psychosocial  Psychosocial (WDL) X  Patient Behaviors Calm;Cooperative

## 2019-05-12 NOTE — Progress Notes (Signed)
Established hemodialysis patient known at [facility] 11:00.   [name] Dialysis Coordinator [phone]

## 2019-05-12 NOTE — Plan of Care (Signed)
  Problem: Clinical Measurements: Goal: Ability to maintain clinical measurements within normal limits will improve Outcome: Not Progressing Note: Patient continues to have fever at times.

## 2019-05-12 NOTE — TOC Initial Note (Signed)
Transition of Care [facility]) - Initial/Assessment Note    Patient Details  Name: Barbara [name] MRN: [id number] Date of Birth: Dec 04, 1940  Transition of Care [facility]) CM/SW Contact:    [name], RN Phone Number: 05/12/2019, 10:24 AM  Clinical Narrative:                 RNCM attempted to see patient for high risk admission score.  Patient off the floor.  Will attempt at a later time         Patient Goals and CMS Choice        Expected Discharge Plan and Services                                                Prior Living Arrangements/Services                       Activities of Daily Living Home Assistive Devices/Equipment: Other (Comment)(unknown) ADL Screening (condition at time of admission) Patient's cognitive ability adequate to safely complete daily activities?: No Is the patient deaf or have difficulty hearing?: No Does the patient have difficulty seeing, even when wearing glasses/contacts?: No Does the patient have difficulty concentrating, remembering, or making decisions?: Yes Patient able to express need for assistance with ADLs?: No Does the patient have difficulty dressing or bathing?: Yes Independently performs ADLs?: No Does the patient have difficulty walking or climbing stairs?: Yes Weakness of Legs: Both Weakness of Arms/Hands: None  Permission Sought/Granted                  Emotional Assessment              Admission diagnosis:  Sepsis with acute hypoxic respiratory failure, due to unspecified organism, unspecified whether septic shock present ([place]) [A41.9, R65.20, J96.01] Patient Active Problem List   Diagnosis Date Noted  . PNA (pneumonia) 05/11/2019  . Dialysis patient ([place])   . Metabolic acidosis 62/69/4854  . Complication from renal dialysis device 01/28/2018  . History of colonic polyps 07/30/2017  . Presence of cardiac and vascular implant and graft   . Fever   . Sepsis ([place])   . End stage renal  disease ([place]) 02/01/2017  . Sepsis secondary to UTI ([place]) 01/24/2017  . Acute kidney injury ([place]) 11/19/2016  . Anemia, chronic renal failure, stage 4 (severe) ([place]) 09/17/2016  . Hypoglycemia 06/04/2016  . Hyperkalemia 05/12/2016  . Protein-calorie malnutrition, severe 05/05/2016  . Acute on chronic renal failure ([place]) 05/04/2016  . Seizure ([place]) 04/17/2016  . Palliative care by specialist   . DNR (do not resuscitate) discussion   . C. difficile diarrhea 03/11/2016  . Anemia 03/11/2016  . Hypotension 03/11/2016  . Acidosis 03/11/2016  . Failure to thrive (child) 03/11/2016  . Weakness generalized 03/11/2016  . ARF (acute renal failure) ([place]) 03/04/2016  . Cancer of trigone of urinary bladder ([place]) 03/02/2016  . Absolute anemia 02/04/2016  . Airway hyperreactivity 02/04/2016  . Celiac disease 02/04/2016  . Gastric catarrh 02/04/2016  . Acid reflux 02/04/2016  . Combined fat and carbohydrate induced hyperlipemia 02/04/2016  . C. difficile colitis 01/22/2016  . Urinary obstruction 01/21/2016  . COPD (chronic obstructive pulmonary disease) ([place]) 01/21/2016  . Controlled type 2 diabetes mellitus with stage 4 chronic kidney disease, with long-term current use of insulin ([place]) 01/21/2016  . Essential hypertension  01/21/2016  . Acute renal failure superimposed on stage 4 chronic kidney disease ([place]) 01/20/2016  . Anemia in chronic kidney disease 01/20/2016  . Arthritis of knee, degenerative 05/10/2015  . Knee strain 03/26/2015  . Other intervertebral disc displacement, lumbar region 03/04/2015  . Degenerative arthritis of lumbar spine 03/04/2015  . HNP (herniated nucleus pulposus), lumbar 03/04/2015  . Osteoarthritis of spine with radiculopathy, lumbar region 03/04/2015  . Injury of tendon of upper extremity 11/12/2014  . Atherosclerosis of abdominal aorta ([place]) 11/02/2014  . Chronic kidney disease, stage IV (severe) ([place]) 11/02/2014  . Obstructive apnea 11/02/2014  . Complete  rotator cuff rupture of left shoulder 10/05/2014  . Arthritis of shoulder region, degenerative 10/05/2014  . CAD in native artery 12/08/2013  . Benign essential HTN 12/08/2013  . Type 2 diabetes mellitus ([place]) 12/08/2013   PCP:  [name], MD Pharmacy:   [id number] [place] [place] [id number]: [phone]: [phone]    Social Determinants of Health (SDOH) Interventions    Readmission Risk Interventions No flowsheet data found.

## 2019-05-12 NOTE — Progress Notes (Signed)
Patient on 2 liters o2. Attempted to put cpap on patient. Patient immediately became frustrated and started hollering. Kept turning away so that I could not put cpap on. RN at bedside to witness. Mask/circuit at bedside for future use if pt able to tolerate at another time.

## 2019-05-12 NOTE — Consult Note (Signed)
Pharmacy Antibiotic Note  Barbara [name] is a 78 y.o. female admitted on 05/11/2019 with pneumonia.  Pharmacy has been consulted for cefepime and vancomycin dosing. CT shows new small bilateral pleural effusions and bibasilar opacities that are likely atelectasis. The patient received a loading dose of 1500 mg vancomycin and 2 grams cefepime 10/22. She has ESRD on HD MWF with her most recent HD session today. She continues to have febrile episodes despite antibiotics.  Plan: 1) Vancomycin 1000 mg IV with each HD session   Target pre-HD vancomycin level 15-25 mcg/mL  Level will be drawn prior to the 3rd HD session  2)  begin cefepime 1 gram every 24 hours scheduled after HD  Height: 5' 8"  (172.7 cm) Weight: 180 lb 12.4 oz (82 kg) IBW/kg (Calculated) : 63.9  Temp (24hrs), Avg:101.2 F (38.4 C), Min:99.2 F (37.3 C), Max:102.8 F (39.3 C)  Recent Labs  Lab 05/11/19 1055  WBC 13.6*  CREATININE 7.49*  LATICACIDVEN 0.9    Estimated Creatinine Clearance: 6.8 mL/min (A) (by C-G formula based on SCr of 7.49 mg/dL (H)).    Antimicrobials this admission: cefepime 10/22 >>  vancomycin 10/22 >>   Microbiology results: 10/22 BCx: pending 10/22 UCx: E coli  10/22 SARS CoV-2: negative  10/22 MRSA PCR: pending  Thank you for allowing pharmacy to be a part of this patient's care.  Dallie Piles, PharmD 05/12/2019 8:25 AM

## 2019-05-12 NOTE — Progress Notes (Signed)
HD Initiated:    05/12/19 0857  Vital Signs  Temp 99 F (37.2 C)  Temp Source Oral  Pulse Rate 86  Pulse Rate Source Dinamap  Resp (!) 22  BP (!) 158/65  BP Location Right Arm  BP Method Automatic  Patient Position (if appropriate) Lying  Oxygen Therapy  SpO2 98 %  O2 Device Nasal Cannula  O2 Flow Rate (L/min) 2 L/min  During Hemodialysis Assessment  Blood Flow Rate (mL/min) 400 mL/min  Arterial Pressure (mmHg) -200 mmHg  Venous Pressure (mmHg) 160 mmHg  Transmembrane Pressure (mmHg) 60 mmHg  Ultrafiltration Rate (mL/min) 860 mL/min  Dialysate Flow Rate (mL/min) 600 ml/min  Conductivity: Machine  14.1  HD Safety Checks Performed Yes  Dialysis Fluid Bolus Normal Saline  Bolus Amount (mL) 250 mL  Intra-Hemodialysis Comments Tx initiated

## 2019-05-12 NOTE — Progress Notes (Signed)
Advanced care plan.  Purpose of the Encounter: CODE STATUS  Parties in Animal nutritionist over phone  Patient's Decision Capacity: not intact Subjective/Patient's story: Patient is a 78 year old with multiple medical problems including end-stage renal disease, COPD, peripheral arterial disease, coronary artery disease, history of bladder cancer and sleep apnea who is admitted with pneumonia.  Patient has not been speaking since she has been admitted.  Has felt failed her swallow eval as well.   Objective/Medical story  I discussed with her husband regarding patient's overall advanced age and multiple medical problems and high risk of cardiopulmonary arrest.  He voices understanding of her conditions.  Goals of care determination:   Patient husband states that initially he would like everything to be done and wants to keep her "alive" .  But also states that if her condition worsens he would not want her to be uncomfortable and kept in a vegetative state.    CODE STATUS: Full code   Time spent discussing advanced care planning: 16 minutes

## 2019-05-12 NOTE — Progress Notes (Signed)
[facility]  ROUNDING NOTE   Subjective:   [name] admitted to [facility] on 05/11/2019 for Sepsis with acute hypoxic respiratory failure, due to unspecified organism, unspecified whether septic shock present ([place]) [A41.9, R65.20, J96.01]  Patient is unable to answer any questions.   Placed on hemodialysis treatment. Tolerating treatment well.     HEMODIALYSIS FLOWSHEET:  Blood Flow Rate (mL/min): 400 mL/min Arterial Pressure (mmHg): -250 mmHg Venous Pressure (mmHg): 180 mmHg Transmembrane Pressure (mmHg): 50 mmHg Ultrafiltration Rate (mL/min): 860 mL/min Dialysate Flow Rate (mL/min): 600 ml/min Conductivity: Machine : 14 Conductivity: Machine : 14 Dialysis Fluid Bolus: Normal Saline Bolus Amount (mL): 250 mL    Objective:  Vital signs in last 24 hours:  Temp:  [99 F (37.2 C)-102.8 F (39.3 C)] 99 F (37.2 C) (10/23 0857) Pulse Rate:  [81-95] 89 (10/23 1015) Resp:  [13-36] 20 (10/23 1015) BP: (138-198)/(48-80) 152/66 (10/23 1015) SpO2:  [81 %-100 %] 99 % (10/23 1015) Weight:  [82 kg] 82 kg (10/23 0850)  Weight change:  Filed Weights   05/11/19 1100 05/12/19 0850  Weight: 82 kg 82 kg    Intake/Output: No intake/output data recorded.   Intake/Output this shift:  No intake/output data recorded.  Physical Exam: General: NAD,   Head: Normocephalic, atraumatic. Moist oral mucosal membranes  Eyes: Anicteric, PERRL  Neck: Supple, trachea midline  Lungs:  Clear to auscultation  Heart: Regular rate and rhythm  Abdomen:  Soft, nontender,   Extremities:  no peripheral edema.  Neurologic: Not able to answer questions or follow commands.   Skin: No lesions  Access: Left AVG    Basic Metabolic Panel: Recent Labs  Lab 05/11/19 1055  NA 137  K 3.5  CL 99  CO2 25  GLUCOSE 109*  BUN 27*  CREATININE 7.49*  CALCIUM 8.6*    Liver Function Tests: Recent Labs  Lab 05/11/19 1055  AST 13*  ALT 8  ALKPHOS 89  BILITOT 0.8  PROT 8.8*   ALBUMIN 3.5   No results for input(s): LIPASE, AMYLASE in the last 168 hours. No results for input(s): AMMONIA in the last 168 hours.  CBC: Recent Labs  Lab 05/11/19 1055  WBC 13.6*  NEUTROABS 12.0*  HGB 11.3*  HCT 36.3  MCV 98.9  PLT 199    Cardiac Enzymes: No results for input(s): CKTOTAL, CKMB, CKMBINDEX, TROPONINI in the last 168 hours.  BNP: Invalid input(s): POCBNP  CBG: Recent Labs  Lab 05/11/19 2122 05/12/19 0215 05/12/19 0810  GLUCAP 102* 90 42    Microbiology: Results for orders placed or performed during the hospital encounter of 05/11/19  Blood Culture (routine x 2)     Status: None (Preliminary result)   Collection Time: 05/11/19 11:21 AM   Specimen: BLOOD  Result Value Ref Range Status   Specimen Description BLOOD RIGHT ANTECUBITAL  Final   Special Requests   Final    BOTTLES DRAWN AEROBIC AND ANAEROBIC Blood Culture adequate volume   Culture   Final    NO GROWTH < 24 HOURS Performed at [facility], [address] [place], [place] [address]    Report Status PENDING  Incomplete  SARS Coronavirus 2 by RT PCR (hospital order, performed in [place] hospital lab) Nasopharyngeal Nasopharyngeal Swab     Status: None   Collection Time: 05/11/19 12:01 PM   Specimen: Nasopharyngeal Swab  Result Value Ref Range Status   SARS Coronavirus 2 NEGATIVE NEGATIVE Final    Comment: (NOTE) If result  is NEGATIVE SARS-CoV-2 target nucleic acids are NOT DETECTED. The SARS-CoV-2 RNA is generally detectable in upper and lower  respiratory specimens during the acute phase of infection. The lowest  concentration of SARS-CoV-2 viral copies this assay can detect is 250  copies / mL. A negative result does not preclude SARS-CoV-2 infection  and should not be used as the sole basis for treatment or other  patient management decisions.  A negative result may occur with  improper specimen collection / handling, submission of specimen other  than  nasopharyngeal swab, presence of viral mutation(s) within the  areas targeted by this assay, and inadequate number of viral copies  (<250 copies / mL). A negative result must be combined with clinical  observations, patient history, and epidemiological information. If result is POSITIVE SARS-CoV-2 target nucleic acids are DETECTED. The SARS-CoV-2 RNA is generally detectable in upper and lower  respiratory specimens dur ing the acute phase of infection.  Positive  results are indicative of active infection with SARS-CoV-2.  Clinical  correlation with patient history and other diagnostic information is  necessary to determine patient infection status.  Positive results do  not rule out bacterial infection or co-infection with other viruses. If result is PRESUMPTIVE POSTIVE SARS-CoV-2 nucleic acids MAY BE PRESENT.   A presumptive positive result was obtained on the submitted specimen  and confirmed on repeat testing.  While 2019 novel coronavirus  (SARS-CoV-2) nucleic acids may be present in the submitted sample  additional confirmatory testing may be necessary for epidemiological  and / or clinical management purposes  to differentiate between  SARS-CoV-2 and other Sarbecovirus currently known to infect humans.  If clinically indicated additional testing with an alternate test  methodology [phone]) is advised. The SARS-CoV-2 RNA is generally  detectable in upper and lower respiratory sp ecimens during the acute  phase of infection. The expected result is Negative. Fact Sheet for Patients:  StrictlyIdeas.no Fact Sheet for Healthcare Providers: BankingDealers.co.za This test is not yet approved or cleared by the [place] FDA and has been authorized for detection and/or diagnosis of SARS-CoV-2 by FDA under an Emergency Use Authorization (EUA).  This EUA will remain in effect (meaning this test can be used) for the duration of  the COVID-19 declaration under Section 564(b)(1) of the Act, 21 U.S.C. section 360bbb-3(b)(1), unless the authorization is terminated or revoked sooner. Performed at [facility], [address] [place], [place] [address]   Blood Culture (routine x 2)     Status: None (Preliminary result)   Collection Time: 05/11/19  9:59 PM   Specimen: BLOOD  Result Value Ref Range Status   Specimen Description BLOOD BLOOD RIGHT HAND  Final   Special Requests   Final    BOTTLES DRAWN AEROBIC AND ANAEROBIC Blood Culture adequate volume   Culture   Final    NO GROWTH < 12 HOURS Performed at [facility], [address] [place], [place] [address]    Report Status PENDING  Incomplete    Coagulation Studies: No results for input(s): LABPROT, INR in the last 72 hours.  Urinalysis: No results for input(s): COLORURINE, LABSPEC, PHURINE, GLUCOSEU, HGBUR, BILIRUBINUR, KETONESUR, PROTEINUR, UROBILINOGEN, NITRITE, LEUKOCYTESUR in the last 72 hours.  Invalid input(s): APPERANCEUR    Imaging: Ct Abdomen Pelvis Wo Contrast  Result Date: 05/11/2019 CLINICAL DATA:  Abdominal pain, fever.  History of bladder cancer EXAM: CT ABDOMEN AND PELVIS WITHOUT CONTRAST TECHNIQUE: Multidetector CT imaging of the abdomen and pelvis was performed following the standard  protocol without IV contrast. COMPARISON:  01/19/2018 FINDINGS: Lower chest: Small bilateral pleural effusions. Cardiomegaly. Bibasilar airspace opacities likely atelectasis. Hepatobiliary: No focal hepatic abnormality. Gallbladder unremarkable. Pancreas: No focal abnormality or ductal dilatation. Spleen: No focal abnormality.  Normal size. Adrenals/Urinary Tract: Severe bilateral hydroureteronephrosis, unchanged. Bladder wall is markedly thickened and stable since prior study. No visible adrenal lesion. Stomach/Bowel: Stomach, large and small bowel grossly unremarkable. Vascular/Lymphatic: Heavily calcified aorta and iliac vessels. Right  common iliac lymph node again noted with a short axis diameter of 1.5 cm, stable. Right inguinal lymph node has a short axis diameter of 7 mm, stable. Reproductive: Prior hysterectomy.  No adnexal masses. Other: No free fluid or free air. Musculoskeletal: No acute bony abnormality. IMPRESSION: Continued marked bladder wall thickening and severe bilateral hydronephrosis. Findings similar to prior study. Heavily calcified aorta and iliac vessels. New small bilateral pleural effusions. Bibasilar opacities, likely atelectasis. Stable mildly enlarged and borderline sized right common iliac and right inguinal lymph nodes. Electronically Signed   By: Rolm Baptise M.D.   On: 05/11/2019 13:51   Ct Head Wo Contrast  Result Date: 05/12/2019 CLINICAL DATA:  Altered mental status. EXAM: CT HEAD WITHOUT CONTRAST TECHNIQUE: Contiguous axial images were obtained from the base of the skull through the vertex without intravenous contrast. COMPARISON:  Head CT 05/11/2017 FINDINGS: Brain: Stable age related cerebral atrophy, ventriculomegaly and periventricular white matter disease. No extra-axial fluid collections are identified. No CT findings for acute hemispheric infarction or intracranial hemorrhage. No mass lesions. The brainstem and cerebellum are normal. Vascular: Stable vascular calcifications. No aneurysm or hyperdense vessels. Skull: No skull fracture or bone lesions. Sinuses/Orbits: The paranasal sinuses and mastoid air cells are clear. The globes are intact. Other: No scalp lesions or hematoma. IMPRESSION: 1. Stable age related cerebral atrophy, ventriculomegaly and periventricular white matter disease. 2. No acute intracranial findings or mass lesions. Electronically Signed   By: Marijo Sanes M.D.   On: 05/12/2019 08:51   Dg Chest Port 1 View  Result Date: 05/11/2019 CLINICAL DATA:  Sepsis EXAM: PORTABLE CHEST 1 VIEW COMPARISON:  05/10/2018 FINDINGS: Cardiac shadow is mildly prominent but accentuated by the  portable technique. Further stenting in the left innominate vein and left subclavian vein is noted. Right chest wall port is been removed in the interval. Diffuse vascular congestion is noted with mild interstitial edema. This may be related to a degree of volume overload related to the end-stage renal failure. No focal confluent infiltrate is seen. No sizable effusion is noted. No bony abnormality is noted. IMPRESSION: Vascular congestion likely related to the known renal failure and volume overload. No other focal abnormality is seen. Electronically Signed   By: Inez Catalina M.D.   On: 05/11/2019 12:11     Medications:    . atorvastatin  40 mg Oral QPM  . calcium acetate  1,334 mg Oral TID WC  . calcium carbonate  1 tablet Oral Daily  . cholecalciferol  1,000 Units Oral Daily  . cinacalcet  30 mg Oral Q breakfast  . ferrous sulfate  325 mg Oral QPC breakfast  . furosemide  40 mg Oral BID  . gabapentin  600 mg Oral TID  . heparin  5,000 Units Subcutaneous Q8H  . lidocaine (PF)  5 mL Intradermal Once  . losartan  50 mg Oral QPM  . mirabegron ER  25 mg Oral Daily  . oxybutynin  5 mg Oral TID  . pantoprazole  40 mg Oral Daily   acetaminophen **OR**  acetaminophen, albuterol, calcium acetate, diazepam, ibuprofen, iohexol, loperamide  Assessment/ Plan:  Barbara [name] is a 78 y.o. black female with end stage renal disease on hemodialysis, hypertension, diabetes mellitus type II, diabetic neuropathy who is admitted to [facility] on 05/11/2019 for Sepsis with acute hypoxic respiratory failure, due to unspecified organism, unspecified whether septic shock present ([place]) [A41.9, R65.20, J96.01]  [facility] MWF [place] Left AVG  1. End Stage Renal Disease: seen and examined on hemodialysis. Tolerating treatment well.   2. Sepsis: empirically given azithromycin, cefepime, ceftriaxone, and vanco Cultures negative.  Blood pressure stable.   3. Altered mental status: CT negative -  Discontinue cefepime and gabapentin which can cause delirium with her renal function.   4. Anemia of chronic kidney disease: hemoglobin 11.3. EPO as outpatient.   5. Secondary Hyperparathyroidism: Low PTH 98 on 10/12 - Discontinue cinacalcet - Continue binders.     LOS: 1 Maragret Vanacker 10/23/202010:23 AM

## 2019-05-12 NOTE — Progress Notes (Signed)
Pre HD:   05/12/19 0850  Vital Signs  Temp 99 F (37.2 C)  Temp Source Oral  Pulse Rate 87  Pulse Rate Source Dinamap  Resp 19  BP (!) 158/65  BP Location Right Arm  BP Method Automatic  Patient Position (if appropriate) Lying  Oxygen Therapy  SpO2 98 %  O2 Device Nasal Cannula  O2 Flow Rate (L/min) 2 L/min  Dialysis Weight  Weight 82 kg  Type of Weight Pre-Dialysis  Time-Out for Hemodialysis  What Procedure? HD   Pt Identifiers(min of two) First/Last Name;MRN/Account#;Pt's DOB(use if MRN/Acct# not available  Correct Site? Yes  Correct Side? Yes  Correct Procedure? Yes  Consents Verified? Yes  Rad Studies Available? N/A  Safety Precautions Reviewed? Yes  Engineer, civil (consulting) Number 5  Station Number 4  UF/Alarm Test Passed  Conductivity: Meter 13.8  Conductivity: Machine  14.1  pH 7  Reverse Osmosis main  Normal Saline Lot Number [id number]  Dialyzer Lot Number [id number]  Disposable Set Lot Number [id number]  Machine Temperature 98.6 F (50 C)  Musician and Audible Yes  Blood Lines Intact and Secured Yes  Pre Treatment Patient Checks  Vascular access used during treatment Fistula  HD catheter dressing before treatment  (n/a)  Patient is receiving dialysis in a chair  (no)  Hepatitis B Surface Antigen Results Pending  Isolation Initiated  (yes)  Hepatitis B Surface Antibody  (pending)  Date Hepatitis B Surface Antibody Drawn 05/12/19  Hemodialysis Consent Verified Yes  Hemodialysis Standing Orders Initiated Yes  ECG (Telemetry) Monitor On Yes  Prime Ordered Normal Saline  Length of  DialysisTreatment -hour(s) 3.5 Hour(s)  Dialysis Treatment Comments  (Na 140)  Dialyzer Elisio 17H NR  Dialysate 3K;2.5 Ca  Dialysate Flow Ordered 600  Blood Flow Rate Ordered 400 mL/min  Ultrafiltration Goal 2.5 Liters  Dialysis Blood Pressure Support Ordered Normal Saline  Education / Care Plan  Dialysis Education Provided Yes  Documented Education in Care Plan  Yes  Outpatient Plan of Care Reviewed and on Chart Yes  Fistula / Graft Left Upper arm Arteriovenous vein graft  Placement Date/Time: 02/17/17 1111   Orientation: Left  Access Location: Upper arm  Access Type: Arteriovenous vein graft  Site Condition No complications  Fistula / Graft Assessment Bruit;Thrill;Present  Status Accessed  Needle Size 15  Drainage Description None

## 2019-05-12 NOTE — Progress Notes (Signed)
PHARMACY - PHYSICIAN COMMUNICATION CRITICAL VALUE ALERT - BLOOD CULTURE IDENTIFICATION (BCID)  Results for orders placed or performed during the hospital encounter of 05/11/19  Blood Culture ID Panel (Reflexed) (Collected: 05/11/2019 11:21 AM)  Result Value Ref Range   Enterococcus species NOT DETECTED NOT DETECTED   Listeria monocytogenes NOT DETECTED NOT DETECTED   Staphylococcus species NOT DETECTED NOT DETECTED   Staphylococcus aureus (BCID) NOT DETECTED NOT DETECTED   Streptococcus species NOT DETECTED NOT DETECTED   Streptococcus agalactiae NOT DETECTED NOT DETECTED   Streptococcus pneumoniae NOT DETECTED NOT DETECTED   Streptococcus pyogenes NOT DETECTED NOT DETECTED   Acinetobacter baumannii NOT DETECTED NOT DETECTED   Enterobacteriaceae species NOT DETECTED NOT DETECTED   Enterobacter cloacae complex NOT DETECTED NOT DETECTED   Escherichia coli NOT DETECTED NOT DETECTED   Klebsiella oxytoca NOT DETECTED NOT DETECTED   Klebsiella pneumoniae NOT DETECTED NOT DETECTED   Proteus species NOT DETECTED NOT DETECTED   Serratia marcescens NOT DETECTED NOT DETECTED   Haemophilus influenzae NOT DETECTED NOT DETECTED   Neisseria meningitidis NOT DETECTED NOT DETECTED   Pseudomonas aeruginosa NOT DETECTED NOT DETECTED   Candida albicans NOT DETECTED NOT DETECTED   Candida glabrata NOT DETECTED NOT DETECTED   Candida krusei NOT DETECTED NOT DETECTED   Candida parapsilosis NOT DETECTED NOT DETECTED   Candida tropicalis NOT DETECTED NOT DETECTED    Name of physician (or Provider) Contacted:   Shreyang Patel   Changes to prescribed antibiotics required: Yes, will d/c vancomycin  Kaelene Elliston D 05/12/2019  7:18 PM

## 2019-05-12 NOTE — Progress Notes (Signed)
Barbara [name] at [facility], is a 78 y.o. female, DOB - 03/06/1941, QPY:[id number]  Admit date - 05/11/2019   Admitting Physician Dustin Flock, MD  Outpatient Primary MD for the patient is [name], MD   LOS - 1  Subjective: Pt not able to answer any questions    Review of Systems:   CONSTITUTIONAL:unable to provide  Vitals:   Vitals:   05/12/19 1215 05/12/19 1230 05/12/19 1234 05/12/19 1309  BP: 135/60 130/61 136/68 (!) 144/57  Pulse: 99 (!) 102 95 96  Resp: (!) 21 19 (!) 23   Temp:  98.1 F (36.7 C) 98.1 F (36.7 C) 98.4 F (36.9 C)  TempSrc:  Oral Oral Oral  SpO2: 98% 98% 99% 100%  Weight:      Height:        Wt Readings from Last 3 Encounters:  05/12/19 82 kg  02/28/19 81.6 kg  02/07/19 81.6 kg     Intake/Output Summary (Last 24 hours) at 05/12/2019 1443 Last data filed at 05/12/2019 1234 Gross per 24 hour  Intake 0 ml  Output 2500 ml  Net -2500 ml    Physical Exam:   GENERAL: obsese chronically ill appearing  HEAD, EYES, EARS, NOSE AND THROAT: Atraumatic, normocephalic. Extraocular muscles are intact. Pupils equal and reactive to light. Sclerae anicteric. No conjunctival injection. No oro-pharyngeal erythema.  NECK: Supple. There is no jugular venous distention. No bruits, no lymphadenopathy, no thyromegaly.  HEART: Regular rate and rhythm,. No murmurs, no rubs, no clicks.  LUNGS: Clear to auscultation bilaterally. No rales or rhonchi. No wheezes.  ABDOMEN: Soft, flat, nontender, nondistended. Has good bowel sounds. No hepatosplenomegaly appreciated.  EXTREMITIES: No evidence of any cyanosis, clubbing, or peripheral edema.  +2 pedal and radial pulses bilaterally.  NEUROLOGIC: The patient is alert, awake, and  oriented x3 with no focal motor or sensory deficits appreciated bilaterally.  SKIN: Moist and warm with no rashes appreciated.  Psych: Not anxious, depressed LN: No inguinal LN enlargement    Antibiotics   Anti-infectives (From admission, onward)   Start     Dose/Rate Route Frequency Ordered Stop   05/15/19 1200  vancomycin (VANCOCIN) IVPB 1000 mg/200 mL premix     1,000 mg 200 mL/hr over 60 Minutes Intravenous Every M-W-F (Hemodialysis) 05/12/19 1420     05/12/19 1800  ceFEPIme (MAXIPIME) 1 g in sodium chloride 0.9 % 100 mL IVPB  1 g 200 mL/hr over 30 Minutes Intravenous Every 24 hours 05/12/19 1420     05/12/19 1600  vancomycin (VANCOCIN) IVPB 1000 mg/200 mL premix     1,000 mg 200 mL/hr over 60 Minutes Intravenous  Once 05/12/19 1420     05/11/19 1530  cefTRIAXone (ROCEPHIN) 2 g in sodium chloride 0.9 % 100 mL IVPB  Status:  Discontinued     2 g 200 mL/hr over 30 Minutes Intravenous Every 24 hours 05/11/19 1520 05/12/19 0759   05/11/19 1530  azithromycin (ZITHROMAX) 500 mg in sodium chloride 0.9 % 250 mL IVPB  Status:  Discontinued     500 mg 250 mL/hr over 60 Minutes Intravenous Every 24 hours 05/11/19 1520 05/12/19 0759   05/11/19 1300  vancomycin (VANCOCIN) 500 mg in sodium chloride 0.9 % 100 mL IVPB     500 mg 100 mL/hr over 60 Minutes Intravenous  Once 05/11/19 1116 05/11/19 1539   05/11/19 1100  vancomycin (VANCOCIN) IVPB 1000 mg/200 mL premix     1,000 mg 200 mL/hr over 60 Minutes Intravenous  Once 05/11/19 1054 05/11/19 1534   05/11/19 1100  ceFEPIme (MAXIPIME) 2 g in sodium chloride 0.9 % 100 mL IVPB     2 g 200 mL/hr over 30 Minutes Intravenous  Once 05/11/19 1054 05/11/19 1219      Medications   Scheduled Meds: . atorvastatin  40 mg Oral QPM  . calcium acetate  1,334 mg Oral TID WC  . calcium carbonate  1 tablet Oral Daily  . cholecalciferol  1,000 Units Oral Daily  . ferrous sulfate  325 mg Oral QPC breakfast  . furosemide  40 mg Oral BID  . heparin   5,000 Units Subcutaneous Q8H  . [START ON 05/13/2019] influenza vaccine adjuvanted  0.5 mL Intramuscular Tomorrow-1000  . lidocaine (PF)  5 mL Intradermal Once  . losartan  50 mg Oral QPM  . pantoprazole  40 mg Oral Daily   Continuous Infusions: . ceFEPime (MAXIPIME) IV    . vancomycin    . [START ON 05/15/2019] vancomycin     PRN Meds:.acetaminophen **OR** acetaminophen, albuterol, calcium acetate, diazepam, iohexol, loperamide   Data Review:   Micro Results Recent Results (from the past 240 hour(s))  Blood Culture (routine x 2)     Status: None (Preliminary result)   Collection Time: 05/11/19 11:21 AM   Specimen: BLOOD  Result Value Ref Range Status   Specimen Description BLOOD RIGHT ANTECUBITAL  Final   Special Requests   Final    BOTTLES DRAWN AEROBIC AND ANAEROBIC Blood Culture adequate volume   Culture   Final    NO GROWTH < 24 HOURS Performed at [facility], [address] [place], [place] [address]    Report Status PENDING  Incomplete  Urine culture     Status: Abnormal (Preliminary result)   Collection Time: 05/11/19 11:21 AM   Specimen: Urine, Random  Result Value Ref Range Status   Specimen Description   Final    URINE, RANDOM Performed at [facility], [address] [place], [place] [address]    Special Requests   Final    NONE Performed at [facility], [address], [place], [place] [address]    Culture (A)  Final    >=100,000 COLONIES/mL ESCHERICHIA COLI SUSCEPTIBILITIES TO FOLLOW Performed at [facility], [place] [address] [place], [place] [address]    Report Status PENDING  Incomplete  SARS Coronavirus 2 by RT PCR (hospital  order, performed in [facility] hospital lab) Nasopharyngeal Nasopharyngeal Swab     Status: None   Collection Time: 05/11/19 12:01 PM   Specimen: Nasopharyngeal Swab  Result Value Ref Range Status   SARS Coronavirus 2 NEGATIVE NEGATIVE Final    Comment: (NOTE) If result is  NEGATIVE SARS-CoV-2 target nucleic acids are NOT DETECTED. The SARS-CoV-2 RNA is generally detectable in upper and lower  respiratory specimens during the acute phase of infection. The lowest  concentration of SARS-CoV-2 viral copies this assay can detect is 250  copies / mL. A negative result does not preclude SARS-CoV-2 infection  and should not be used as the sole basis for treatment or other  patient management decisions.  A negative result may occur with  improper specimen collection / handling, submission of specimen other  than nasopharyngeal swab, presence of viral mutation(s) within the  areas targeted by this assay, and inadequate number of viral copies  (<250 copies / mL). A negative result must be combined with clinical  observations, patient history, and epidemiological information. If result is POSITIVE SARS-CoV-2 target nucleic acids are DETECTED. The SARS-CoV-2 RNA is generally detectable in upper and lower  respiratory specimens dur ing the acute phase of infection.  Positive  results are indicative of active infection with SARS-CoV-2.  Clinical  correlation with patient history and other diagnostic information is  necessary to determine patient infection status.  Positive results do  not rule out bacterial infection or co-infection with other viruses. If result is PRESUMPTIVE POSTIVE SARS-CoV-2 nucleic acids MAY BE PRESENT.   A presumptive positive result was obtained on the submitted specimen  and confirmed on repeat testing.  While 2019 novel coronavirus  (SARS-CoV-2) nucleic acids may be present in the submitted sample  additional confirmatory testing may be necessary for epidemiological  and / or clinical management purposes  to differentiate between  SARS-CoV-2 and other Sarbecovirus currently known to infect humans.  If clinically indicated additional testing with an alternate test  methodology [phone]) is advised. The SARS-CoV-2 RNA is generally  detectable  in upper and lower respiratory sp ecimens during the acute  phase of infection. The expected result is Negative. Fact Sheet for Patients:  StrictlyIdeas.no Fact Sheet for Healthcare Providers: BankingDealers.co.za This test is not yet approved or cleared by the [place] FDA and has been authorized for detection and/or diagnosis of SARS-CoV-2 by FDA under an Emergency Use Authorization (EUA).  This EUA will remain in effect (meaning this test can be used) for the duration of the COVID-19 declaration under Section 564(b)(1) of the Act, 21 U.S.C. section 360bbb-3(b)(1), unless the authorization is terminated or revoked sooner. Performed at [facility], [address] [place], [place] [address]   Blood Culture (routine x 2)     Status: None (Preliminary result)   Collection Time: 05/11/19  9:59 PM   Specimen: BLOOD  Result Value Ref Range Status   Specimen Description BLOOD BLOOD RIGHT HAND  Final   Special Requests   Final    BOTTLES DRAWN AEROBIC AND ANAEROBIC Blood Culture adequate volume   Culture   Final    NO GROWTH < 12 HOURS Performed at [facility], [address] [place],  [address]    Report Status PENDING  Incomplete    Radiology Reports Ct Abdomen Pelvis Wo Contrast  Result Date: 05/11/2019 CLINICAL DATA:  Abdominal pain, fever.  History of bladder cancer EXAM: CT ABDOMEN AND PELVIS WITHOUT CONTRAST TECHNIQUE: Multidetector CT imaging of the  abdomen and pelvis was performed following the standard protocol without IV contrast. COMPARISON:  01/19/2018 FINDINGS: Lower chest: Small bilateral pleural effusions. Cardiomegaly. Bibasilar airspace opacities likely atelectasis. Hepatobiliary: No focal hepatic abnormality. Gallbladder unremarkable. Pancreas: No focal abnormality or ductal dilatation. Spleen: No focal abnormality.  Normal size. Adrenals/Urinary Tract: Severe bilateral  hydroureteronephrosis, unchanged. Bladder wall is markedly thickened and stable since prior study. No visible adrenal lesion. Stomach/Bowel: Stomach, large and small bowel grossly unremarkable. Vascular/Lymphatic: Heavily calcified aorta and iliac vessels. Right common iliac lymph node again noted with a short axis diameter of 1.5 cm, stable. Right inguinal lymph node has a short axis diameter of 7 mm, stable. Reproductive: Prior hysterectomy.  No adnexal masses. Other: No free fluid or free air. Musculoskeletal: No acute bony abnormality. IMPRESSION: Continued marked bladder wall thickening and severe bilateral hydronephrosis. Findings similar to prior study. Heavily calcified aorta and iliac vessels. New small bilateral pleural effusions. Bibasilar opacities, likely atelectasis. Stable mildly enlarged and borderline sized right common iliac and right inguinal lymph nodes. Electronically Signed   By: Rolm Baptise M.D.   On: 05/11/2019 13:51   Ct Head Wo Contrast  Result Date: 05/12/2019 CLINICAL DATA:  Altered mental status. EXAM: CT HEAD WITHOUT CONTRAST TECHNIQUE: Contiguous axial images were obtained from the base of the skull through the vertex without intravenous contrast. COMPARISON:  Head CT 05/11/2017 FINDINGS: Brain: Stable age related cerebral atrophy, ventriculomegaly and periventricular white matter disease. No extra-axial fluid collections are identified. No CT findings for acute hemispheric infarction or intracranial hemorrhage. No mass lesions. The brainstem and cerebellum are normal. Vascular: Stable vascular calcifications. No aneurysm or hyperdense vessels. Skull: No skull fracture or bone lesions. Sinuses/Orbits: The paranasal sinuses and mastoid air cells are clear. The globes are intact. Other: No scalp lesions or hematoma. IMPRESSION: 1. Stable age related cerebral atrophy, ventriculomegaly and periventricular white matter disease. 2. No acute intracranial findings or mass lesions.  Electronically Signed   By: Marijo Sanes M.D.   On: 05/12/2019 08:51   Dg Chest Port 1 View  Result Date: 05/11/2019 CLINICAL DATA:  Sepsis EXAM: PORTABLE CHEST 1 VIEW COMPARISON:  05/10/2018 FINDINGS: Cardiac shadow is mildly prominent but accentuated by the portable technique. Further stenting in the left innominate vein and left subclavian vein is noted. Right chest wall port is been removed in the interval. Diffuse vascular congestion is noted with mild interstitial edema. This may be related to a degree of volume overload related to the end-stage renal failure. No focal confluent infiltrate is seen. No sizable effusion is noted. No bony abnormality is noted. IMPRESSION: Vascular congestion likely related to the known renal failure and volume overload. No other focal abnormality is seen. Electronically Signed   By: Inez Catalina M.D.   On: 05/11/2019 12:11     CBC Recent Labs  Lab 05/11/19 1055  WBC 13.6*  HGB 11.3*  HCT 36.3  PLT 199  MCV 98.9  MCH 30.8  MCHC 31.1  RDW 13.4  LYMPHSABS 0.8  MONOABS 0.7  EOSABS 0.0  BASOSABS 0.0    Chemistries  Recent Labs  Lab 05/11/19 1055  NA 137  K 3.5  CL 99  CO2 25  GLUCOSE 109*  BUN 27*  CREATININE 7.49*  CALCIUM 8.6*  AST 13*  ALT 8  ALKPHOS 89  BILITOT 0.8   ------------------------------------------------------------------------------------------------------------------ estimated creatinine clearance is 6.8 mL/min (A) (by C-G formula based on SCr of 7.49 mg/dL (H)). ------------------------------------------------------------------------------------------------------------------ No results for input(s): HGBA1C in the last  72 hours. ------------------------------------------------------------------------------------------------------------------ No results for input(s): CHOL, HDL, LDLCALC, TRIG, CHOLHDL, LDLDIRECT in the last 72  hours. ------------------------------------------------------------------------------------------------------------------ No results for input(s): TSH, T4TOTAL, T3FREE, THYROIDAB in the last 72 hours.  Invalid input(s): FREET3 ------------------------------------------------------------------------------------------------------------------ No results for input(s): VITAMINB12, FOLATE, FERRITIN, TIBC, IRON, RETICCTPCT in the last 72 hours.  Coagulation profile No results for input(s): INR, PROTIME in the last 168 hours.  No results for input(s): DDIMER in the last 72 hours.  Cardiac Enzymes No results for input(s): CKMB, TROPONINI, MYOGLOBIN in the last 168 hours.  Invalid input(s): CK ------------------------------------------------------------------------------------------------------------------ Invalid input(s): POCBNP    Assessment & Plan   IMPRESSION AND PLAN: Patient 78 year old presenting with abdominal pain confusion and hypoxia  1.   pneumonia with  Recurrent fever I will change her to iv van and cefepime  2.  Acute encephalopathy likely due to #1 d/w husband she does this from time to time, obtain ct of the head  Keep npo  3.  End-stage renal disease nephrology has been consulted for hemodialysis continue PhosLo and Sensipar  4.  Diabetes type 2 we will place on sliding scale insulin   5.  Sleep apnea CPAP at bedtime  6.  GERD continue Nexium  5, hypertension hold bp meds      Code Status Orders  (From admission, onward)         Start     Ordered   05/11/19 2137  Full code  Continuous     05/11/19 2136        Code Status History    Date Active Date Inactive Code Status Order ID Comments User Context   07/27/2018 1819 07/30/2018 1640 Full Code [id number]  Sela Hua, MD Inpatient   05/10/2018 1831 05/13/2018 2249 Full Code [id number]  Hillary Bow, MD ED   01/24/2017 2348 01/29/2017 1614 Full Code [id number]  [place], Donnelly, DO Inpatient    11/19/2016 1528 11/23/2016 2156 Full Code [id number]  Loletha Grayer, MD Inpatient   06/05/2016 0108 06/08/2016 1345 Full Code [id number]  Lance Coon, MD Inpatient   05/12/2016 1432 05/15/2016 1746 Full Code [id number]  Lytle Butte, MD Inpatient   05/04/2016 1455 05/09/2016 1828 Full Code [id number]  Epifanio Lesches, MD ED   04/17/2016 1336 04/20/2016 2041 Full Code [id number]  Demetrios Loll, MD Inpatient   03/04/2016 1358 03/10/2016 1440 Full Code [id number]  Hillary Bow, MD ED   01/21/2016 2115 01/27/2016 1801 Full Code [id number]  Edwin Dada, MD Inpatient   01/20/2016 1841 01/21/2016 2115 Full Code [id number]  Theodoro Grist, MD Inpatient   12/18/2015 0214 12/23/2015 1651 Full Code [id number]  Saundra Shelling, MD Inpatient   Advance Care Planning Activity           Consults  none   DVT Prophylaxis  Lovenox   Lab Results  Component Value Date   PLT 199 05/11/2019     Time Spent in minutes  64mn  Greater than 50% of time spent in care coordination and counseling patient regarding the condition and plan of care.   SDustin FlockM.D on 05/12/2019 at 2:43 PM  Between 7am to 6pm - Pager - [phone]  After 6pm go to [email or web address] - pProofreader [employer]   Office  [phone]

## 2019-05-13 LAB — BASIC METABOLIC PANEL
Anion gap: 18 — ABNORMAL HIGH (ref 5–15)
BUN: 33 mg/dL — ABNORMAL HIGH (ref 8–23)
CO2: 23 mmol/L (ref 22–32)
Calcium: 8.9 mg/dL (ref 8.9–10.3)
Chloride: 98 mmol/L (ref 98–111)
Creatinine, Ser: 7.39 mg/dL — ABNORMAL HIGH (ref 0.44–1.00)
GFR calc Af Amer: 6 mL/min — ABNORMAL LOW (ref >60)
GFR calc non Af Amer: 5 mL/min — ABNORMAL LOW (ref >60)
Glucose, Bld: 117 mg/dL — ABNORMAL HIGH (ref 70–99)
Potassium: 3.8 mmol/L (ref 3.5–5.1)
Sodium: 139 mmol/L (ref 135–145)

## 2019-05-13 LAB — HEPATITIS B SURFACE ANTIBODY, QUANTITATIVE: Hep B S AB Quant (Post): <3.1 m[IU]/mL — ABNORMAL LOW (ref >9.9)

## 2019-05-13 LAB — CBC
HCT: 35.3 % — ABNORMAL LOW (ref 36.0–46.0)
Hemoglobin: 10.9 g/dL — ABNORMAL LOW (ref 12.0–15.0)
MCH: 30.5 pg (ref 26.0–34.0)
MCHC: 30.9 g/dL (ref 30.0–36.0)
MCV: 98.9 fL (ref 80.0–100.0)
Platelets: 180 10*3/uL (ref 150–400)
RBC: 3.57 MIL/uL — ABNORMAL LOW (ref 3.87–5.11)
RDW: 13.4 % (ref 11.5–15.5)
WBC: 13.2 10*3/uL — ABNORMAL HIGH (ref 4.0–10.5)
nRBC: 0 % (ref 0.0–0.2)

## 2019-05-13 LAB — URINE CULTURE: Culture: >=100000 — AB

## 2019-05-13 MED ORDER — CHLORHEXIDINE GLUCONATE 0.12 % MT SOLN
15.0000 mL | OROMUCOSAL | Status: DC
  Administered 2019-05-13 – 2019-05-26 (×42): 15 mL via OROMUCOSAL
  Filled 2019-05-13 (×45): qty 15

## 2019-05-13 MED ORDER — PANTOPRAZOLE SODIUM 40 MG IV SOLR
40.0000 mg | Freq: Two times a day (BID) | INTRAVENOUS | Status: DC
  Administered 2019-05-13 – 2019-05-21 (×17): 40 mg via INTRAVENOUS
  Filled 2019-05-13 (×17): qty 40

## 2019-05-13 NOTE — Progress Notes (Signed)
[place], is a 78 y.o. female, DOB - 1941-07-14, EQA:[id number]  Admit date - 05/11/2019   Admitting Physician Dustin Flock, MD  Outpatient Primary MD for the patient is [name], MD   LOS - 2  Subjective: Patient continues to not be able to answer any questions.    Review of Systems:   CONSTITUTIONAL:unable to provide  Vitals:   Vitals:   05/12/19 1309 05/12/19 1959 05/13/19 0548 05/13/19 1254  BP: (!) 144/57 (!) 152/58 (!) 132/57 (!) 132/51  Pulse: 96 98 87 88  Resp:  18 20 18   Temp: 98.4 F (36.9 C)  98.9 F (37.2 C) 98.7 F (37.1 C)  TempSrc: Oral  Axillary Oral  SpO2: 100% 95% 99% 97%  Weight:      Height:        Wt Readings from Last 3 Encounters:  05/12/19 82 kg  02/28/19 81.6 kg  02/07/19 81.6 kg     Intake/Output Summary (Last 24 hours) at 05/13/2019 1308 Last data filed at 05/13/2019 0600 Gross per 24 hour  Intake 311.92 ml  Output -  Net 311.92 ml    Physical Exam:   GENERAL: obsese chronically ill appearing  HEAD, EYES, EARS, NOSE AND THROAT: Atraumatic, normocephalic. Extraocular muscles are intact. Pupils equal and reactive to light. Sclerae anicteric. No conjunctival injection. No oro-pharyngeal erythema.  NECK: Supple. There is no jugular venous distention. No bruits, no lymphadenopathy, no thyromegaly.  HEART: Regular rate and rhythm,. No murmurs, no rubs, no clicks.  LUNGS: Clear to auscultation bilaterally. No rales or rhonchi. No wheezes.  ABDOMEN: Soft, flat, nontender, nondistended. Has good bowel sounds. No hepatosplenomegaly appreciated.  EXTREMITIES: No evidence of any cyanosis, clubbing, or peripheral edema.  +2 pedal and radial pulses bilaterally.  NEUROLOGIC: Patient  awake but not communicating SKIN: Moist and warm with no rashes appreciated.  Psych: Not anxious, depressed LN: No inguinal LN enlargement    Antibiotics   Anti-infectives (From admission, onward)   Start     Dose/Rate Route Frequency Ordered Stop   05/15/19 1200  vancomycin (VANCOCIN) IVPB 1000 mg/200 mL premix  Status:  Discontinued     1,000 mg 200 mL/hr over 60 Minutes Intravenous Every M-W-F (Hemodialysis) 05/12/19 1420 05/12/19 1917   05/12/19 1800  ceFEPIme (MAXIPIME) 1 g in sodium chloride 0.9 % 100 mL IVPB     1 g 200 mL/hr over 30  Minutes Intravenous Every 24 hours 05/12/19 1420     05/12/19 1600  vancomycin (VANCOCIN) IVPB 1000 mg/200 mL premix     1,000 mg 200 mL/hr over 60 Minutes Intravenous  Once 05/12/19 1420 05/12/19 1656   05/11/19 1530  cefTRIAXone (ROCEPHIN) 2 g in sodium chloride 0.9 % 100 mL IVPB  Status:  Discontinued     2 g 200 mL/hr over 30 Minutes Intravenous Every 24 hours 05/11/19 1520 05/12/19 0759   05/11/19 1530  azithromycin (ZITHROMAX) 500 mg in sodium chloride 0.9 % 250 mL IVPB  Status:  Discontinued     500 mg 250 mL/hr over 60 Minutes Intravenous Every 24 hours 05/11/19 1520 05/12/19 0759   05/11/19 1300  vancomycin (VANCOCIN) 500 mg in sodium chloride 0.9 % 100 mL IVPB     500 mg 100 mL/hr over 60 Minutes Intravenous  Once 05/11/19 1116 05/11/19 1539   05/11/19 1100  vancomycin (VANCOCIN) IVPB 1000 mg/200 mL premix     1,000 mg 200 mL/hr over 60 Minutes Intravenous  Once 05/11/19 1054 05/11/19 1534   05/11/19 1100  ceFEPIme (MAXIPIME) 2 g in sodium chloride 0.9 % 100 mL IVPB     2 g 200 mL/hr over 30 Minutes Intravenous  Once 05/11/19 1054 05/11/19 1219      Medications   Scheduled Meds: . chlorhexidine  15 mL Mouth/Throat Q4H  . Chlorhexidine Gluconate Cloth  6 each Topical Daily  . heparin  5,000 Units Subcutaneous Q8H  . influenza vaccine adjuvanted  0.5 mL Intramuscular Tomorrow-1000  . lidocaine (PF)  5 mL Intradermal Once  .  mupirocin ointment  1 application Nasal BID   Continuous Infusions: . ceFEPime (MAXIPIME) IV 1 g (05/12/19 1648)   PRN Meds:.acetaminophen **OR** acetaminophen, albuterol, iohexol   Data Review:   Micro Results Recent Results (from the past 240 hour(s))  Blood Culture (routine x 2)     Status: Abnormal (Preliminary result)   Collection Time: 05/11/19 11:21 AM   Specimen: BLOOD  Result Value Ref Range Status   Specimen Description   Final    BLOOD RIGHT ANTECUBITAL Performed at [facility], [address] [place], [place] [address]    Special Requests   Final    BOTTLES DRAWN AEROBIC AND ANAEROBIC Blood Culture adequate volume Performed at [facility], [address] [place], [place] [address]    Culture  Setup Time   Final    GRAM NEGATIVE RODS ANAEROBIC BOTTLE ONLY CRITICAL RESULT CALLED TO, READ BACK BY AND VERIFIED WITH: JASON ROBBINS AT 6301 05/12/2019 KMP    Culture (A)  Final    ANAEROBIC GRAM NEGATIVE ROD CULTURE REINCUBATED FOR BETTER GROWTH Performed at [facility], [place] [address], [place], [place] [address]    Report Status PENDING  Incomplete  Urine culture     Status: Abnormal   Collection Time: 05/11/19 11:21 AM   Specimen: Urine, Random  Result Value Ref Range Status   Specimen Description   Final    URINE, RANDOM Performed at [facility], [address], [place], [place] [address]    Special Requests   Final    NONE Performed at [facility], [address] [place],  [address]    Culture >=100,000 COLONIES/mL ESCHERICHIA COLI (A)  Final   Report Status 05/13/2019 FINAL  Final   Organism ID, Bacteria ESCHERICHIA COLI (A)  Final      Susceptibility   Escherichia coli - MIC*  AMPICILLIN <=2 SENSITIVE Sensitive     CEFAZOLIN <=4 SENSITIVE Sensitive     CEFTRIAXONE <=1 SENSITIVE Sensitive     CIPROFLOXACIN <=0.25 SENSITIVE Sensitive     GENTAMICIN <=1 SENSITIVE Sensitive     IMIPENEM  <=0.25 SENSITIVE Sensitive     NITROFURANTOIN <=16 SENSITIVE Sensitive     TRIMETH/SULFA <=20 SENSITIVE Sensitive     AMPICILLIN/SULBACTAM <=2 SENSITIVE Sensitive     PIP/TAZO <=4 SENSITIVE Sensitive     Extended ESBL NEGATIVE Sensitive     * >=100,000 COLONIES/mL ESCHERICHIA COLI  Blood Culture ID Panel (Reflexed)     Status: None   Collection Time: 05/11/19 11:21 AM  Result Value Ref Range Status   Enterococcus species NOT DETECTED NOT DETECTED Final   Listeria monocytogenes NOT DETECTED NOT DETECTED Final   Staphylococcus species NOT DETECTED NOT DETECTED Final   Staphylococcus aureus (BCID) NOT DETECTED NOT DETECTED Final   Streptococcus species NOT DETECTED NOT DETECTED Final   Streptococcus agalactiae NOT DETECTED NOT DETECTED Final   Streptococcus pneumoniae NOT DETECTED NOT DETECTED Final   Streptococcus pyogenes NOT DETECTED NOT DETECTED Final   Acinetobacter baumannii NOT DETECTED NOT DETECTED Final   Enterobacteriaceae species NOT DETECTED NOT DETECTED Final   Enterobacter cloacae complex NOT DETECTED NOT DETECTED Final   Escherichia coli NOT DETECTED NOT DETECTED Final   Klebsiella oxytoca NOT DETECTED NOT DETECTED Final   Klebsiella pneumoniae NOT DETECTED NOT DETECTED Final   Proteus species NOT DETECTED NOT DETECTED Final   Serratia marcescens NOT DETECTED NOT DETECTED Final   Haemophilus influenzae NOT DETECTED NOT DETECTED Final   Neisseria meningitidis NOT DETECTED NOT DETECTED Final   Pseudomonas aeruginosa NOT DETECTED NOT DETECTED Final   Candida albicans NOT DETECTED NOT DETECTED Final   Candida glabrata NOT DETECTED NOT DETECTED Final   Candida krusei NOT DETECTED NOT DETECTED Final   Candida parapsilosis NOT DETECTED NOT DETECTED Final   Candida tropicalis NOT DETECTED NOT DETECTED Final    Comment: Performed at [facility], [address] [place], [place] [address]  SARS Coronavirus 2 by RT PCR (hospital order, performed in [facility] hospital  lab) Nasopharyngeal Nasopharyngeal Swab     Status: None   Collection Time: 05/11/19 12:01 PM   Specimen: Nasopharyngeal Swab  Result Value Ref Range Status   SARS Coronavirus 2 NEGATIVE NEGATIVE Final    Comment: (NOTE) If result is NEGATIVE SARS-CoV-2 target nucleic acids are NOT DETECTED. The SARS-CoV-2 RNA is generally detectable in upper and lower  respiratory specimens during the acute phase of infection. The lowest  concentration of SARS-CoV-2 viral copies this assay can detect is 250  copies / mL. A negative result does not preclude SARS-CoV-2 infection  and should not be used as the sole basis for treatment or other  patient management decisions.  A negative result may occur with  improper specimen collection / handling, submission of specimen other  than nasopharyngeal swab, presence of viral mutation(s) within the  areas targeted by this assay, and inadequate number of viral copies  (<250 copies / mL). A negative result must be combined with clinical  observations, patient history, and epidemiological information. If result is POSITIVE SARS-CoV-2 target nucleic acids are DETECTED. The SARS-CoV-2 RNA is generally detectable in upper and lower  respiratory specimens dur ing the acute phase of infection.  Positive  results are indicative of active infection with SARS-CoV-2.  Clinical  correlation with patient history and other diagnostic information is  necessary to determine patient  infection status.  Positive results do  not rule out bacterial infection or co-infection with other viruses. If result is PRESUMPTIVE POSTIVE SARS-CoV-2 nucleic acids MAY BE PRESENT.   A presumptive positive result was obtained on the submitted specimen  and confirmed on repeat testing.  While 2019 novel coronavirus  (SARS-CoV-2) nucleic acids may be present in the submitted sample  additional confirmatory testing may be necessary for epidemiological  and / or clinical management purposes  to  differentiate between  SARS-CoV-2 and other Sarbecovirus currently known to infect humans.  If clinically indicated additional testing with an alternate test  methodology [phone]) is advised. The SARS-CoV-2 RNA is generally  detectable in upper and lower respiratory sp ecimens during the acute  phase of infection. The expected result is Negative. Fact Sheet for Patients:  StrictlyIdeas.no Fact Sheet for Healthcare Providers: BankingDealers.co.za This test is not yet approved or cleared by the [place] FDA and has been authorized for detection and/or diagnosis of SARS-CoV-2 by FDA under an Emergency Use Authorization (EUA).  This EUA will remain in effect (meaning this test can be used) for the duration of the COVID-19 declaration under Section 564(b)(1) of the Act, 21 U.S.C. section 360bbb-3(b)(1), unless the authorization is terminated or revoked sooner. Performed at [facility], [address] [place], [place] [address]   Blood Culture (routine x 2)     Status: None (Preliminary result)   Collection Time: 05/11/19  9:59 PM   Specimen: BLOOD  Result Value Ref Range Status   Specimen Description BLOOD BLOOD RIGHT HAND  Final   Special Requests   Final    BOTTLES DRAWN AEROBIC AND ANAEROBIC Blood Culture adequate volume   Culture   Final    NO GROWTH 2 DAYS Performed at [facility], [address] [place], [place] [address]    Report Status PENDING  Incomplete  MRSA PCR Screening     Status: Abnormal   Collection Time: 05/12/19  7:01 PM   Specimen: Nasal Mucosa; Nasopharyngeal  Result Value Ref Range Status   MRSA by PCR POSITIVE (A) NEGATIVE Final    Comment:        The GeneXpert MRSA Assay (FDA approved for NASAL specimens only), is one component of a comprehensive MRSA colonization surveillance program. It is not intended to diagnose MRSA infection nor to guide or monitor treatment for MRSA  infections. CRITICAL RESULT CALLED TO, READ BACK BY AND VERIFIED WITH: Jacqulynn Cadet AT 2103 05/12/2019 KMP Performed at [facility], [address] [place], [place] [address]     Radiology Reports Ct Abdomen Pelvis Wo Contrast  Result Date: 05/11/2019 CLINICAL DATA:  Abdominal pain, fever.  History of bladder cancer EXAM: CT ABDOMEN AND PELVIS WITHOUT CONTRAST TECHNIQUE: Multidetector CT imaging of the abdomen and pelvis was performed following the standard protocol without IV contrast. COMPARISON:  01/19/2018 FINDINGS: Lower chest: Small bilateral pleural effusions. Cardiomegaly. Bibasilar airspace opacities likely atelectasis. Hepatobiliary: No focal hepatic abnormality. Gallbladder unremarkable. Pancreas: No focal abnormality or ductal dilatation. Spleen: No focal abnormality.  Normal size. Adrenals/Urinary Tract: Severe bilateral hydroureteronephrosis, unchanged. Bladder wall is markedly thickened and stable since prior study. No visible adrenal lesion. Stomach/Bowel: Stomach, large and small bowel grossly unremarkable. Vascular/Lymphatic: Heavily calcified aorta and iliac vessels. Right common iliac lymph node again noted with a short axis diameter of 1.5 cm, stable. Right inguinal lymph node has a short axis diameter of 7 mm, stable. Reproductive: Prior hysterectomy.  No adnexal masses. Other: No free  fluid or free air. Musculoskeletal: No acute bony abnormality. IMPRESSION: Continued marked bladder wall thickening and severe bilateral hydronephrosis. Findings similar to prior study. Heavily calcified aorta and iliac vessels. New small bilateral pleural effusions. Bibasilar opacities, likely atelectasis. Stable mildly enlarged and borderline sized right common iliac and right inguinal lymph nodes. Electronically Signed   By: Rolm Baptise M.D.   On: 05/11/2019 13:51   Ct Head Wo Contrast  Result Date: 05/12/2019 CLINICAL DATA:  Altered mental status. EXAM: CT HEAD WITHOUT CONTRAST  TECHNIQUE: Contiguous axial images were obtained from the base of the skull through the vertex without intravenous contrast. COMPARISON:  Head CT 05/11/2017 FINDINGS: Brain: Stable age related cerebral atrophy, ventriculomegaly and periventricular white matter disease. No extra-axial fluid collections are identified. No CT findings for acute hemispheric infarction or intracranial hemorrhage. No mass lesions. The brainstem and cerebellum are normal. Vascular: Stable vascular calcifications. No aneurysm or hyperdense vessels. Skull: No skull fracture or bone lesions. Sinuses/Orbits: The paranasal sinuses and mastoid air cells are clear. The globes are intact. Other: No scalp lesions or hematoma. IMPRESSION: 1. Stable age related cerebral atrophy, ventriculomegaly and periventricular white matter disease. 2. No acute intracranial findings or mass lesions. Electronically Signed   By: Marijo Sanes M.D.   On: 05/12/2019 08:51   Dg Chest Port 1 View  Result Date: 05/11/2019 CLINICAL DATA:  Sepsis EXAM: PORTABLE CHEST 1 VIEW COMPARISON:  05/10/2018 FINDINGS: Cardiac shadow is mildly prominent but accentuated by the portable technique. Further stenting in the left innominate vein and left subclavian vein is noted. Right chest wall port is been removed in the interval. Diffuse vascular congestion is noted with mild interstitial edema. This may be related to a degree of volume overload related to the end-stage renal failure. No focal confluent infiltrate is seen. No sizable effusion is noted. No bony abnormality is noted. IMPRESSION: Vascular congestion likely related to the known renal failure and volume overload. No other focal abnormality is seen. Electronically Signed   By: Inez Catalina M.D.   On: 05/11/2019 12:11     CBC Recent Labs  Lab 05/11/19 1055 05/13/19 0650  WBC 13.6* 13.2*  HGB 11.3* 10.9*  HCT 36.3 35.3*  PLT 199 180  MCV 98.9 98.9  MCH 30.8 30.5  MCHC 31.1 30.9  RDW 13.4 13.4  LYMPHSABS  0.8  --   MONOABS 0.7  --   EOSABS 0.0  --   BASOSABS 0.0  --     Chemistries  Recent Labs  Lab 05/11/19 1055 05/13/19 0650  NA 137 139  K 3.5 3.8  CL 99 98  CO2 25 23  GLUCOSE 109* 117*  BUN 27* 33*  CREATININE 7.49* 7.39*  CALCIUM 8.6* 8.9  AST 13*  --   ALT 8  --   ALKPHOS 89  --   BILITOT 0.8  --    ------------------------------------------------------------------------------------------------------------------ estimated creatinine clearance is 6.9 mL/min (A) (by C-G formula based on SCr of 7.39 mg/dL (H)). ------------------------------------------------------------------------------------------------------------------ No results for input(s): HGBA1C in the last 72 hours. ------------------------------------------------------------------------------------------------------------------ No results for input(s): CHOL, HDL, LDLCALC, TRIG, CHOLHDL, LDLDIRECT in the last 72 hours. ------------------------------------------------------------------------------------------------------------------ No results for input(s): TSH, T4TOTAL, T3FREE, THYROIDAB in the last 72 hours.  Invalid input(s): FREET3 ------------------------------------------------------------------------------------------------------------------ No results for input(s): VITAMINB12, FOLATE, FERRITIN, TIBC, IRON, RETICCTPCT in the last 72 hours.  Coagulation profile No results for input(s): INR, PROTIME in the last 168 hours.  No results for input(s): DDIMER in the last 72 hours.  Cardiac  Enzymes No results for input(s): CKMB, TROPONINI, MYOGLOBIN in the last 168 hours.  Invalid input(s): CK ------------------------------------------------------------------------------------------------------------------ Invalid input(s): POCBNP    Assessment & Plan   IMPRESSION AND PLAN: Patient 78 year old presenting with abdominal pain confusion and hypoxia  1.   pneumonia / uti  Positive blood cultures for E.  Coli, DC Vanco continue cefepime  2.  Acute encephalopathy likely due to #1 d/w husband she does this from time to time, CT scan of the head negative Keep npo If no improvement by Monday needs palliative care input  3.  End-stage renal disease nephrology has been consulted for hemodialysis continue PhosLo and Sensipar  4.  Diabetes type 2 continue sliding-scale insulin   5.  Sleep apnea CPAP at bedtime  6.  GERD Protonix IV  5, hypertension hold bp meds      Code Status Orders  (From admission, onward)         Start     Ordered   05/11/19 2137  Full code  Continuous     05/11/19 2136        Code Status History    Date Active Date Inactive Code Status Order ID Comments User Context   07/27/2018 1819 07/30/2018 1640 Full Code [id number]  Sela Hua, MD Inpatient   05/10/2018 1831 05/13/2018 2249 Full Code [id number]  Hillary Bow, MD ED   01/24/2017 2348 01/29/2017 1614 Full Code [id number]  [place], [place], DO Inpatient   11/19/2016 1528 11/23/2016 2156 Full Code [id number]  Loletha Grayer, MD Inpatient   06/05/2016 0108 06/08/2016 1345 Full Code [id number]  Lance Coon, MD Inpatient   05/12/2016 1432 05/15/2016 1746 Full Code [id number]  Lytle Butte, MD Inpatient   05/04/2016 1455 05/09/2016 1828 Full Code [id number]  Epifanio Lesches, MD ED   04/17/2016 1336 04/20/2016 2041 Full Code [id number]  Demetrios Loll, MD Inpatient   03/04/2016 1358 03/10/2016 1440 Full Code [id number]  Hillary Bow, MD ED   01/21/2016 2115 01/27/2016 1801 Full Code [id number]  Edwin Dada, MD Inpatient   01/20/2016 1841 01/21/2016 2115 Full Code [id number]  Theodoro Grist, MD Inpatient   12/18/2015 0214 12/23/2015 1651 Full Code [id number]  Saundra Shelling, MD Inpatient   Advance Care Planning Activity           Consults  none   DVT Prophylaxis  Lovenox   Lab Results  Component Value Date   PLT 180 05/13/2019     Time Spent in minutes  21mn  Greater than 50% of time spent  in care coordination and counseling patient regarding the condition and plan of care.   SDustin FlockM.D on 05/13/2019 at 1:08 PM  Between 7am to 6pm - Pager - [phone]  After 6pm go to [email or web address] - [employer]   Office  [phone]

## 2019-05-13 NOTE — Progress Notes (Signed)
Barbara [name]  MRN: [id number]  DOB/AGE: Apr 21, 1941 78 y.o.  Primary Care Physician:Johnston, Jenny Reichmann, MD  Admit date: 05/11/2019  Chief Complaint:  Chief Complaint  Patient presents with  . Weakness    S-Pt presented on  05/11/2019 with  Chief Complaint  Patient presents with  . Weakness  .    Pt is confused, unable to offer any complaints    Meds  . chlorhexidine  15 mL Mouth/Throat Q4H  . Chlorhexidine Gluconate Cloth  6 each Topical Daily  . heparin  5,000 Units Subcutaneous Q8H  . influenza vaccine adjuvanted  0.5 mL Intramuscular Tomorrow-1000  . lidocaine (PF)  5 mL Intradermal Once  . mupirocin ointment  1 application Nasal BID         QHU:TMLYYT to all systems reviewed.  Physical Exam: Vital signs in last 24 hours: Temp:  [98.1 F (36.7 C)-98.9 F (37.2 C)] 98.9 F (37.2 C) (10/24 0548) Pulse Rate:  [87-102] 87 (10/24 0548) Resp:  [18-23] 20 (10/24 0548) BP: (130-152)/(57-68) 132/57 (10/24 0548) SpO2:  [95 %-100 %] 99 % (10/24 0548) Weight change: 0 kg Last BM Date: 05/12/19  Intake/Output from previous day: 10/23 0701 - 10/24 0700 In: 311.9 [IV Piggyback:311.9] Out: 2500  No intake/output data recorded.   Physical Exam: General- pt is awake,but not  oriented to time place and person Resp- No acute REsp distress, CTA B/L NO Rhonchi CVS- S1S2 regular in rate and rhythm GIT- BS+, soft, NT, ND EXT- NO LE Edema, Cyanosis Access-left AV graft  Lab Results: CBC Recent Labs    05/11/19 1055 05/13/19 0650  WBC 13.6* 13.2*  HGB 11.3* 10.9*  HCT 36.3 35.3*  PLT 199 180    BMET Recent Labs    05/11/19 1055 05/13/19 0650  NA 137 139  K 3.5 3.8  CL 99 98  CO2 25 23  GLUCOSE 109* 117*  BUN 27* 33*  CREATININE 7.49* 7.39*  CALCIUM 8.6* 8.9    MICRO Recent Results (from the past 240 hour(s))  Blood Culture (routine x 2)     Status: Abnormal (Preliminary result)   Collection Time: 05/11/19 11:21 AM   Specimen: BLOOD  Result Value  Ref Range Status   Specimen Description   Final    BLOOD RIGHT ANTECUBITAL Performed at [facility], [address] [place], [place] [address]    Special Requests   Final    BOTTLES DRAWN AEROBIC AND ANAEROBIC Blood Culture adequate volume Performed at [facility], [address] [place], [place] [address]    Culture  Setup Time   Final    GRAM NEGATIVE RODS ANAEROBIC BOTTLE ONLY CRITICAL RESULT CALLED TO, READ BACK BY AND VERIFIED WITH: JASON ROBBINS AT 7517 05/12/2019 KMP    Culture (A)  Final    ANAEROBIC GRAM NEGATIVE ROD CULTURE REINCUBATED FOR BETTER GROWTH Performed at [facility], [place] [address], [place], [place] [address]    Report Status PENDING  Incomplete  Urine culture     Status: Abnormal   Collection Time: 05/11/19 11:21 AM   Specimen: Urine, Random  Result Value Ref Range Status   Specimen Description   Final    URINE, RANDOM Performed at [facility], [address] [place], [place] [address]    Special Requests   Final    NONE Performed at [facility], [address] [place],  [address]    Culture >=100,000 COLONIES/mL ESCHERICHIA COLI (A)  Final   Report  Status 05/13/2019 FINAL  Final   Organism ID, Bacteria ESCHERICHIA COLI (A)  Final      Susceptibility   Escherichia coli - MIC*    AMPICILLIN <=2 SENSITIVE Sensitive     CEFAZOLIN <=4 SENSITIVE Sensitive     CEFTRIAXONE <=1 SENSITIVE Sensitive     CIPROFLOXACIN <=0.25 SENSITIVE Sensitive     GENTAMICIN <=1 SENSITIVE Sensitive     IMIPENEM <=0.25 SENSITIVE Sensitive     NITROFURANTOIN <=16 SENSITIVE Sensitive     TRIMETH/SULFA <=20 SENSITIVE Sensitive     AMPICILLIN/SULBACTAM <=2 SENSITIVE Sensitive     PIP/TAZO <=4 SENSITIVE Sensitive     Extended ESBL NEGATIVE Sensitive     * >=100,000 COLONIES/mL ESCHERICHIA COLI  Blood Culture ID Panel (Reflexed)     Status: None   Collection Time: 05/11/19 11:21 AM  Result Value Ref Range Status    Enterococcus species NOT DETECTED NOT DETECTED Final   Listeria monocytogenes NOT DETECTED NOT DETECTED Final   Staphylococcus species NOT DETECTED NOT DETECTED Final   Staphylococcus aureus (BCID) NOT DETECTED NOT DETECTED Final   Streptococcus species NOT DETECTED NOT DETECTED Final   Streptococcus agalactiae NOT DETECTED NOT DETECTED Final   Streptococcus pneumoniae NOT DETECTED NOT DETECTED Final   Streptococcus pyogenes NOT DETECTED NOT DETECTED Final   Acinetobacter baumannii NOT DETECTED NOT DETECTED Final   Enterobacteriaceae species NOT DETECTED NOT DETECTED Final   Enterobacter cloacae complex NOT DETECTED NOT DETECTED Final   Escherichia coli NOT DETECTED NOT DETECTED Final   Klebsiella oxytoca NOT DETECTED NOT DETECTED Final   Klebsiella pneumoniae NOT DETECTED NOT DETECTED Final   Proteus species NOT DETECTED NOT DETECTED Final   Serratia marcescens NOT DETECTED NOT DETECTED Final   Haemophilus influenzae NOT DETECTED NOT DETECTED Final   Neisseria meningitidis NOT DETECTED NOT DETECTED Final   Pseudomonas aeruginosa NOT DETECTED NOT DETECTED Final   Candida albicans NOT DETECTED NOT DETECTED Final   Candida glabrata NOT DETECTED NOT DETECTED Final   Candida krusei NOT DETECTED NOT DETECTED Final   Candida parapsilosis NOT DETECTED NOT DETECTED Final   Candida tropicalis NOT DETECTED NOT DETECTED Final    Comment: Performed at [facility], [address] [place], [place] [address]  SARS Coronavirus 2 by RT PCR (hospital order, performed in [place] hospital lab) Nasopharyngeal Nasopharyngeal Swab     Status: None   Collection Time: 05/11/19 12:01 PM   Specimen: Nasopharyngeal Swab  Result Value Ref Range Status   SARS Coronavirus 2 NEGATIVE NEGATIVE Final    Comment: (NOTE) If result is NEGATIVE SARS-CoV-2 target nucleic acids are NOT DETECTED. The SARS-CoV-2 RNA is generally detectable in upper and lower  respiratory specimens during the acute phase of  infection. The lowest  concentration of SARS-CoV-2 viral copies this assay can detect is 250  copies / mL. A negative result does not preclude SARS-CoV-2 infection  and should not be used as the sole basis for treatment or other  patient management decisions.  A negative result may occur with  improper specimen collection / handling, submission of specimen other  than nasopharyngeal swab, presence of viral mutation(s) within the  areas targeted by this assay, and inadequate number of viral copies  (<250 copies / mL). A negative result must be combined with clinical  observations, patient history, and epidemiological information. If result is POSITIVE SARS-CoV-2 target nucleic acids are DETECTED. The SARS-CoV-2 RNA is generally detectable in upper and lower  respiratory specimens dur ing the acute phase  of infection.  Positive  results are indicative of active infection with SARS-CoV-2.  Clinical  correlation with patient history and other diagnostic information is  necessary to determine patient infection status.  Positive results do  not rule out bacterial infection or co-infection with other viruses. If result is PRESUMPTIVE POSTIVE SARS-CoV-2 nucleic acids MAY BE PRESENT.   A presumptive positive result was obtained on the submitted specimen  and confirmed on repeat testing.  While 2019 novel coronavirus  (SARS-CoV-2) nucleic acids may be present in the submitted sample  additional confirmatory testing may be necessary for epidemiological  and / or clinical management purposes  to differentiate between  SARS-CoV-2 and other Sarbecovirus currently known to infect humans.  If clinically indicated additional testing with an alternate test  methodology [phone]) is advised. The SARS-CoV-2 RNA is generally  detectable in upper and lower respiratory sp ecimens during the acute  phase of infection. The expected result is Negative. Fact Sheet for Patients:   StrictlyIdeas.no Fact Sheet for Healthcare Providers: BankingDealers.co.za This test is not yet approved or cleared by the [place] FDA and has been authorized for detection and/or diagnosis of SARS-CoV-2 by FDA under an Emergency Use Authorization (EUA).  This EUA will remain in effect (meaning this test can be used) for the duration of the COVID-19 declaration under Section 564(b)(1) of the Act, 21 U.S.C. section 360bbb-3(b)(1), unless the authorization is terminated or revoked sooner. Performed at [facility], [address] [place], [place] [address]   Blood Culture (routine x 2)     Status: None (Preliminary result)   Collection Time: 05/11/19  9:59 PM   Specimen: BLOOD  Result Value Ref Range Status   Specimen Description BLOOD BLOOD RIGHT HAND  Final   Special Requests   Final    BOTTLES DRAWN AEROBIC AND ANAEROBIC Blood Culture adequate volume   Culture   Final    NO GROWTH 2 DAYS Performed at [facility], [address] [place], [place] [address]    Report Status PENDING  Incomplete  MRSA PCR Screening     Status: Abnormal   Collection Time: 05/12/19  7:01 PM   Specimen: Nasal Mucosa; Nasopharyngeal  Result Value Ref Range Status   MRSA by PCR POSITIVE (A) NEGATIVE Final    Comment:        The GeneXpert MRSA Assay (FDA approved for NASAL specimens only), is one component of a comprehensive MRSA colonization surveillance program. It is not intended to diagnose MRSA infection nor to guide or monitor treatment for MRSA infections. CRITICAL RESULT CALLED TO, READ BACK BY AND VERIFIED WITH: Jacqulynn Cadet AT 2103 05/12/2019 KMP Performed at [facility], [address], [place], [place] [address]       Lab Results  Component Value Date   CALCIUM 8.9 05/13/2019   PHOS 4.1 07/27/2018               Impression:  Ms. TYSHELL RAMBERG is a 77 y.o.  African-American female  with end stage renal disease on hemodialysis, hypertension, diabetes mellitus type II, diabetic neuropathy who is admitted to [facility] on 05/11/2019 for Sepsis with acute hypoxic respiratory failure, due to unspecified organism, unspecified whether septic shock present ([place]) [A41.9, R65.20, J96.01]   1)Renal ESRD               Patient is on hemodialysis               Patient is on Monday Wednesday Friday  schedule               She was last dialyzed yesterday                No need for hemodialysis today  2)HTN Blood pressure stable   3)Anemia of chronic disease  HGb at goal (9--11) Will keep on Epogen during dialysis  4) secondary hyperparathyroidism  CKD Mineral-Bone Disorder PTH  over suppressed Secondary Hyperparathyroidism  Present Patient was on cinacalcet which is now being held Phosphorus at goal.   5) sepsis Patient is on broad-spectrum antibiotics Primary  MD following  6) electrolytes Normokalemic NOrmonatremic   7)Acid base Co2 at goal     Plan:  Will continue current tx. No need for hemodialysis today    Dhrithi Riche s Katielynn Horan 05/13/2019, 10:04 AM

## 2019-05-14 ENCOUNTER — Inpatient Hospital Stay: Payer: Medicare Other

## 2019-05-14 DIAGNOSIS — A419 Sepsis, unspecified organism: Secondary | ICD-10-CM

## 2019-05-14 DIAGNOSIS — J9601 Acute respiratory failure with hypoxia: Secondary | ICD-10-CM

## 2019-05-14 DIAGNOSIS — R6520 Severe sepsis without septic shock: Secondary | ICD-10-CM

## 2019-05-14 LAB — CULTURE, BLOOD (ROUTINE X 2): Special Requests: ADEQUATE

## 2019-05-14 MED ORDER — METRONIDAZOLE IN NACL 5-0.79 MG/ML-% IV SOLN
500.0000 mg | Freq: Three times a day (TID) | INTRAVENOUS | Status: DC
  Administered 2019-05-14 – 2019-05-15 (×2): 500 mg via INTRAVENOUS
  Filled 2019-05-14 (×4): qty 100

## 2019-05-14 MED ORDER — SODIUM CHLORIDE 0.9 % IV SOLN
INTRAVENOUS | Status: DC | PRN
  Administered 2019-05-14 – 2019-05-15 (×3): 250 mL via INTRAVENOUS
  Administered 2019-05-20: 500 mL via INTRAVENOUS
  Administered 2019-05-20: 50 mL via INTRAVENOUS
  Administered 2019-05-21: 18:00:00 30 mL via INTRAVENOUS
  Administered 2019-05-23 – 2019-05-25 (×5): 250 mL via INTRAVENOUS

## 2019-05-14 MED ORDER — CLONIDINE HCL 0.2 MG/24HR TD PTWK
0.2000 mg | MEDICATED_PATCH | TRANSDERMAL | Status: DC
  Administered 2019-05-14: 0.2 mg via TRANSDERMAL
  Filled 2019-05-14: qty 1

## 2019-05-14 MED ORDER — EPOETIN ALFA 2000 UNIT/ML IJ SOLN
2000.0000 [IU] | Freq: Once | INTRAMUSCULAR | Status: AC
  Administered 2019-05-17: 2000 [IU] via INTRAVENOUS
  Filled 2019-05-14: qty 1

## 2019-05-14 NOTE — Consult Note (Signed)
Reason for Consult:AMS Referring Physician: Posey Pronto  CC: AMS  HPI: Barbara [name] is an 78 y.o. female with a history of multiple medical problems who is unable to provide any history today therefore all history obtained from the chart.  Patient presented on 10/22 with confusion, SOB, hypoxia and abdominal pain.  Concern was for PNA.  Patient started on antibiotics but has remained altered.  It appears that the patient has had AMS when she has been seriously ill in the past.   At baseline able to dress herself, bathe herself and feed herself.  Drives to the grocery store at times.    Past Medical History:  Diagnosis Date  . Adult celiac disease   . Anemia associated with chronic renal failure 2017   blood transfusion last week 10/17  . Arthritis   . Asthma   . 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 Chronic kidney disease   . 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  dr. Candiss Norse and dr. Holley Raring follow her  . COPD (chronic obstructive pulmonary disease) ([employer])   . Coronary artery disease   . Diabetes mellitus without complication ([place])   . Dialysis patient ([facility])    Tues, Thurs, Sat  . Dyspnea    with exertion  . Elevated lipids   . GERD (gastroesophageal reflux disease)   . Hematuria   . Hypertension   . Lower back pain   . Neuropathy   . Oxygen dependent    at hs  . Personal history of chemotherapy 2017   bladder ca  . Personal history of radiation therapy 2017   bladder ca  . Sleep apnea    uses cpap  . Urinary obstruction 01/2016    Past Surgical History:  Procedure Laterality Date  . A/V SHUNT INTERVENTION N/A 05/12/2018   Procedure: A/V SHUNT INTERVENTION;  Surgeon: Algernon Huxley, MD;  Location: [facility];  Service: Cardiovascular;  Laterality: N/A;  . A/V SHUNT INTERVENTION Left 07/29/2018   Procedure: A/V SHUNT INTERVENTION;  Surgeon: Katha Cabal, MD;  Location: [facility];  Service: Cardiovascular;  Laterality: Left;  . ABDOMINAL  HYSTERECTOMY    . AV FISTULA PLACEMENT Left 02/17/2017   Procedure: INSERTION OF ARTERIOVENOUS (AV) GORE-TEX GRAFT ARM(BRACHIAL AXILLARY);  Surgeon: Katha Cabal, MD;  Location: [facility];  Service: Vascular;  Laterality: Left;  . CYSTOSCOPY W/ RETROGRADES Bilateral 02/17/2016   Procedure: CYSTOSCOPY WITH RETROGRADE PYELOGRAM;  Surgeon: Hollice Espy, MD;  Location: [facility];  Service: Urology;  Laterality: Bilateral;  . CYSTOSCOPY W/ RETROGRADES Bilateral 10/12/2016   Procedure: CYSTOSCOPY WITH RETROGRADE PYELOGRAM;  Surgeon: Hollice Espy, MD;  Location: [facility];  Service: Urology;  Laterality: Bilateral;  . CYSTOSCOPY W/ RETROGRADES Bilateral 06/09/2017   Procedure: CYSTOSCOPY WITH RETROGRADE PYELOGRAM;  Surgeon: Hollice Espy, MD;  Location: [facility];  Service: Urology;  Laterality: Bilateral;  . CYSTOSCOPY W/ RETROGRADES Bilateral 10/07/2017   Procedure: CYSTOSCOPY WITH RETROGRADE PYELOGRAM;  Surgeon: Hollice Espy, MD;  Location: [facility];  Service: Urology;  Laterality: Bilateral;  . CYSTOSCOPY W/ URETERAL STENT PLACEMENT Left 05/12/2016   Procedure: CYSTOSCOPY WITH STENT REPLACEMENT;  Surgeon: Hollice Espy, MD;  Location: [facility];  Service: Urology;  Laterality: Left;  . CYSTOSCOPY W/ URETERAL STENT PLACEMENT Left 10/12/2016   Procedure: CYSTOSCOPY WITH STENT REPLACEMENT;  Surgeon: Hollice Espy, MD;  Location: [facility];  Service: Urology;  Laterality: Left;  . CYSTOSCOPY WITH BIOPSY N/A 06/09/2017   Procedure: CYSTOSCOPY  WITH BLADDER BIOPSY;  Surgeon: Hollice Espy, MD;  Location: [facility];  Service: Urology;  Laterality: N/A;  . CYSTOSCOPY WITH STENT PLACEMENT Left 01/21/2016   Procedure: CYSTOSCOPY WITH double J STENT PLACEMENT;  Surgeon: Franchot Gallo, MD;  Location: [facility];  Service: Urology;  Laterality: Left;  . CYSTOSCOPY WITH STENT PLACEMENT Right 10/12/2016   Procedure: CYSTOSCOPY WITH STENT PLACEMENT;  Surgeon: Hollice Espy, MD;  Location: [facility];  Service:  Urology;  Laterality: Right;  . DIALYSIS/PERMA CATHETER INSERTION N/A 11/20/2016   Procedure: Dialysis/Perma Catheter Insertion;  Surgeon: Algernon Huxley, MD;  Location: [facility];  Service: Cardiovascular;  Laterality: N/A;  . DIALYSIS/PERMA CATHETER REMOVAL N/A 03/30/2017   Procedure: DIALYSIS/PERMA CATHETER REMOVAL;  Surgeon: Katha Cabal, MD;  Location: [facility];  Service: Cardiovascular;  Laterality: N/A;  . KIDNEY SURGERY  01/21/2016   IR NEPHROSTOMY PLACEMENT LEFT   . PERIPHERAL VASCULAR CATHETERIZATION N/A 04/07/2016   Procedure: Glori Luis Cath Insertion;  Surgeon: Katha Cabal, MD;  Location: [facility];  Service: Cardiovascular;  Laterality: N/A;  . PERIPHERAL VASCULAR THROMBECTOMY Left 06/02/2017   Procedure: PERIPHERAL VASCULAR THROMBECTOMY;  Surgeon: Algernon Huxley, MD;  Location: [facility];  Service: Cardiovascular;  Laterality: Left;  . PERIPHERAL VASCULAR THROMBECTOMY Left 01/28/2018   Procedure: PERIPHERAL VASCULAR THROMBECTOMY;  Surgeon: Katha Cabal, MD;  Location: [facility];  Service: Cardiovascular;  Laterality: Left;  . PERIPHERAL VASCULAR THROMBECTOMY Left 03/14/2018   Procedure: PERIPHERAL VASCULAR THROMBECTOMY;  Surgeon: Algernon Huxley, MD;  Location: [facility];  Service: Cardiovascular;  Laterality: Left;  . PERIPHERAL VASCULAR THROMBECTOMY Left 03/16/2018   Procedure: PERIPHERAL VASCULAR THROMBECTOMY;  Surgeon: Katha Cabal, MD;  Location: [facility];  Service: Cardiovascular;  Laterality: Left;  . PORTA CATH REMOVAL N/A 02/07/2019   Procedure: PORTA CATH REMOVAL;  Surgeon: Katha Cabal, MD;  Location: [facility];  Service: Cardiovascular;  Laterality: N/A;  . PORTACATH PLACEMENT Right   . ROTATOR CUFF REPAIR Left   . TEMPORARY DIALYSIS CATHETER N/A 05/11/2018   Procedure: TEMPORARY DIALYSIS CATHETER;  Surgeon: Katha Cabal, MD;  Location: [facility];   Service: Cardiovascular;  Laterality: N/A;  . TEMPORARY DIALYSIS CATHETER Left 07/27/2018   Procedure: TEMPORARY DIALYSIS CATHETER;  Surgeon: Katha Cabal, MD;  Location: [facility];  Service: Cardiovascular;  Laterality: Left;  . TRANSURETHRAL RESECTION OF BLADDER TUMOR N/A 02/17/2016   Procedure: TRANSURETHRAL RESECTION OF BLADDER TUMOR (TURBT)-LARGE;  Surgeon: Hollice Espy, MD;  Location: [facility];  Service: Urology;  Laterality: N/A;  . TRANSURETHRAL RESECTION OF BLADDER TUMOR N/A 10/07/2017   Procedure: TRANSURETHRAL RESECTION OF BLADDER TUMOR (TURBT)-small;  Surgeon: Hollice Espy, MD;  Location: [facility];  Service: Urology;  Laterality: N/A;  . URETEROSCOPY Left 02/17/2016   Procedure: URETEROSCOPY;  Surgeon: Hollice Espy, MD;  Location: [facility];  Service: Urology;  Laterality: Left;  . URETEROSCOPY Right 10/12/2016   Procedure: URETEROSCOPY;  Surgeon: Hollice Espy, MD;  Location: [facility];  Service: Urology;  Laterality: Right;    Family History  Problem Relation Age of Onset  . Diabetes Mother   . Cancer Father   . Breast cancer Neg Hx   . Kidney disease Neg Hx     Social History:  reports that she quit smoking about 16 years ago. Her smoking use included cigarettes. She has never used smokeless tobacco. She reports that she does not drink  alcohol or use drugs.  Allergies  Allergen Reactions  . Glipizide Er Other (See Comments)    Hypoglycemia   . Percocet [Oxycodone-Acetaminophen] Hives    Medications:  I have reviewed the patient's current medications. Prior to Admission:  Medications Prior to Admission  Medication Sig Dispense Refill Last Dose  . atorvastatin (LIPITOR) 40 MG tablet Take 40 mg by mouth every evening.   05/10/2019 at 1800  . calcium acetate (PHOSLO) 667 MG capsule Take 2 capsules (1,334 mg total) by mouth 3 (three) times daily with meals. (Patient taking differently: Take 1,334 mg by mouth See admin instructions. Take 1334 mg with  each meal and snack) 90 capsule 0 05/11/2019 at 0800  . calcium carbonate (TUMS - DOSED IN MG ELEMENTAL CALCIUM) 500 MG chewable tablet Chew 1 tablet by mouth daily.   05/11/2019 at 0800  . cholecalciferol (VITAMIN D) 1000 units tablet Take 1,000 Units by mouth daily.   05/11/2019 at 0800  . cinacalcet (SENSIPAR) 30 MG tablet Take 30 mg by mouth daily. With largest meal   05/11/2019 at 0800  . esomeprazole (NEXIUM) 40 MG capsule Take 40 mg by mouth daily after breakfast.    05/11/2019 at 0900  . ferrous sulfate 325 (65 FE) MG EC tablet Take 325 mg by mouth daily after breakfast.    05/11/2019 at 0900  . furosemide (LASIX) 40 MG tablet Take 40 mg by mouth 2 (two) times daily.    05/11/2019 at 0800  . gabapentin (NEURONTIN) 300 MG capsule Take 600 mg by mouth 3 (three) times daily.   05/11/2019 at 0800  . losartan (COZAAR) 50 MG tablet Take 50 mg by mouth every evening.   05/10/2019 at 1800  . mirabegron ER (MYRBETRIQ) 25 MG TB24 tablet Take 25 mg by mouth daily.    05/11/2019 at 0800  . oxybutynin (DITROPAN) 5 MG tablet Take 5 mg by mouth 3 (three) times daily.   05/11/2019 at 0800  . ACCU-CHEK AVIVA PLUS test strip 1 each by Other route daily. use as directed  3   . ACCU-CHEK SOFTCLIX LANCETS lancets INJECT 1 EACH AS DIRECTED ONCE DAILY.  3   . acetaminophen (TYLENOL) 500 MG tablet Take 1,000 mg by mouth 2 (two) times daily as needed for moderate pain or headache.    prn at prn  . albuterol (PROVENTIL HFA;VENTOLIN HFA) 108 (90 Base) MCG/ACT inhaler Inhale 2 puffs into the lungs every 6 (six) hours as needed for wheezing or shortness of breath.    prn at prn  . Blood Glucose Monitoring Suppl (ACCU-CHEK AVIVA PLUS) w/Device KIT (USE TO MONITOR BLOOD SUGARS.)  0   . diazepam (VALIUM) 2 MG tablet Take 2 mg by mouth every 12 (twelve) hours as needed for anxiety.   prn at prn  . glucose blood test strip USE AS DIRECTED ONCE DAILY     . ibuprofen (ADVIL) 200 MG tablet Take 200 mg by mouth every 6 (six)  hours as needed for moderate pain.    prn at prn  . loperamide (IMODIUM) 1 MG/5ML solution Take 2 mg by mouth as needed for diarrhea or loose stools.   prn at prn  . NON FORMULARY Place 1 Units into the nose at bedtime. CPAP Time of use 2100-0600     . OXYGEN Inhale 2 L into the lungs at bedtime.      Scheduled: . chlorhexidine  15 mL Mouth/Throat Q4H  . Chlorhexidine Gluconate Cloth  6 each Topical Daily  .  cloNIDine  0.2 mg Transdermal Weekly  . [START ON 05/15/2019] epoetin (EPOGEN/PROCRIT) injection  2,000 Units Intravenous Once  . heparin  5,000 Units Subcutaneous Q8H  . influenza vaccine adjuvanted  0.5 mL Intramuscular Tomorrow-1000  . lidocaine (PF)  5 mL Intradermal Once  . mupirocin ointment  1 application Nasal BID  . pantoprazole (PROTONIX) IV  40 mg Intravenous Q12H    ROS: Unable to obtain due to mental status  Physical Examination: Blood pressure (!) 156/68, pulse 83, temperature 98.8 F (37.1 C), temperature source Oral, resp. rate 20, height _0  (1.727 m), weight 82 kg, SpO2 98 %.  HEENT-  Normocephalic, no lesions, without obvious abnormality.  Normal external eye and conjunctiva.  Normal TM's bilaterally.  Normal auditory canals and external ears. Normal external nose, mucus membranes and septum.  Normal pharynx. Cardiovascular- S1, S2 normal, pulses palpable throughout   Lungs- chest clear, no wheezing, rales, normal symmetric air entry Abdomen- soft, non-tender; bowel sounds normal; no masses,  no organomegaly Extremities- mild LE edema Lymph-no adenopathy palpable Musculoskeletal-no deformity or swelling,  Skin-warm and dry, no hyperpigmentation, vitiligo, or suspicious lesions  Neurological Examination   Mental Status: Patient awake and tracks the examiner around the room and looks toward the voice when her name is called.  She does not follow commands.  She yells out but provides no speech.   Cranial Nerves: II: Blinks to bilateral confrontation, pupils  equal, round, reactive to light and accommodation III,IV, VI: ptosis not present, extra-ocular motions intact bilaterally V,VII: face symmetric VIII: hearing normal bilaterally IX,X: gag reflex unable to be tested XI: unable to test XII: unable to test Motor: Patient keeps arms close to her and feet crossed.  Actively and strongly fights active movement of her extremities Sensory: Responds to noxious stimuli throughout Deep Tendon Reflexes: Symmetric throughout Plantars: Right: mute   Left: mute Cerebellar: Unable to test Gait: not tested due to safety concerns  Laboratory Studies:   Basic Metabolic Panel: Recent Labs  Lab 05/11/19 1055 05/13/19 0650  NA 137 139  K 3.5 3.8  CL 99 98  CO2 25 23  GLUCOSE 109* 117*  BUN 27* 33*  CREATININE 7.49* 7.39*  CALCIUM 8.6* 8.9    Liver Function Tests: Recent Labs  Lab 05/11/19 1055  AST 13*  ALT 8  ALKPHOS 89  BILITOT 0.8  PROT 8.8*  ALBUMIN 3.5   No results for input(s): LIPASE, AMYLASE in the last 168 hours. No results for input(s): AMMONIA in the last 168 hours.  CBC: Recent Labs  Lab 05/11/19 1055 05/13/19 0650  WBC 13.6* 13.2*  NEUTROABS 12.0*  --   HGB 11.3* 10.9*  HCT 36.3 35.3*  MCV 98.9 98.9  PLT 199 180    Cardiac Enzymes: No results for input(s): CKTOTAL, CKMB, CKMBINDEX, TROPONINI in the last 168 hours.  BNP: Invalid input(s): POCBNP  CBG: Recent Labs  Lab 05/11/19 2122 05/12/19 0215 05/12/19 0810 05/12/19 1954  GLUCAP 102* 90 92 115*    Microbiology: Results for orders placed or performed during the hospital encounter of 05/11/19  Blood Culture (routine x 2)     Status: Abnormal   Collection Time: 05/11/19 11:21 AM   Specimen: BLOOD  Result Value Ref Range Status   Specimen Description BLOOD RIGHT ANTECUBITAL  Final   Special Requests   Final    BOTTLES DRAWN AEROBIC AND ANAEROBIC Blood Culture adequate volume   Culture  Setup Time   Final    GRAM NEGATIVE RODS  ANAEROBIC BOTTLE  ONLY CRITICAL RESULT CALLED TO, READ BACK BY AND VERIFIED WITH: JASON ROBBINS AT 1644 05/12/2019 KMP    Culture BACTEROIDES FRAGILIS BETA LACTAMASE POSITIVE  (A)  Final   Report Status 05/14/2019 FINAL  Final  Urine culture     Status: Abnormal   Collection Time: 05/11/19 11:21 AM   Specimen: Urine, Random  Result Value Ref Range Status   Specimen Description   Final    URINE, RANDOM Performed at [facility], [address] [place], [place] [address]    Special Requests   Final    NONE Performed at [facility], [address] [place], [place] [address]    Culture >=100,000 COLONIES/mL ESCHERICHIA COLI (A)  Final   Report Status 05/13/2019 FINAL  Final   Organism ID, Bacteria ESCHERICHIA COLI (A)  Final      Susceptibility   Escherichia coli - MIC*    AMPICILLIN <=2 SENSITIVE Sensitive     CEFAZOLIN <=4 SENSITIVE Sensitive     CEFTRIAXONE <=1 SENSITIVE Sensitive     CIPROFLOXACIN <=0.25 SENSITIVE Sensitive     GENTAMICIN <=1 SENSITIVE Sensitive     IMIPENEM <=0.25 SENSITIVE Sensitive     NITROFURANTOIN <=16 SENSITIVE Sensitive     TRIMETH/SULFA <=20 SENSITIVE Sensitive     AMPICILLIN/SULBACTAM <=2 SENSITIVE Sensitive     PIP/TAZO <=4 SENSITIVE Sensitive     Extended ESBL NEGATIVE Sensitive     * >=100,000 COLONIES/mL ESCHERICHIA COLI  Blood Culture ID Panel (Reflexed)     Status: None   Collection Time: 05/11/19 11:21 AM  Result Value Ref Range Status   Enterococcus species NOT DETECTED NOT DETECTED Final   Listeria monocytogenes NOT DETECTED NOT DETECTED Final   Staphylococcus species NOT DETECTED NOT DETECTED Final   Staphylococcus aureus (BCID) NOT DETECTED NOT DETECTED Final   Streptococcus species NOT DETECTED NOT DETECTED Final   Streptococcus agalactiae NOT DETECTED NOT DETECTED Final   Streptococcus pneumoniae NOT DETECTED NOT DETECTED Final   Streptococcus pyogenes NOT DETECTED NOT DETECTED Final   Acinetobacter baumannii NOT DETECTED NOT  DETECTED Final   Enterobacteriaceae species NOT DETECTED NOT DETECTED Final   Enterobacter cloacae complex NOT DETECTED NOT DETECTED Final   Escherichia coli NOT DETECTED NOT DETECTED Final   Klebsiella oxytoca NOT DETECTED NOT DETECTED Final   Klebsiella pneumoniae NOT DETECTED NOT DETECTED Final   Proteus species NOT DETECTED NOT DETECTED Final   Serratia marcescens NOT DETECTED NOT DETECTED Final   Haemophilus influenzae NOT DETECTED NOT DETECTED Final   Neisseria meningitidis NOT DETECTED NOT DETECTED Final   Pseudomonas aeruginosa NOT DETECTED NOT DETECTED Final   Candida albicans NOT DETECTED NOT DETECTED Final   Candida glabrata NOT DETECTED NOT DETECTED Final   Candida krusei NOT DETECTED NOT DETECTED Final   Candida parapsilosis NOT DETECTED NOT DETECTED Final   Candida tropicalis NOT DETECTED NOT DETECTED Final    Comment: Performed at [facility], [address] [place], [place] [address]  SARS Coronavirus 2 by RT PCR (hospital order, performed in [place] hospital lab) Nasopharyngeal Nasopharyngeal Swab     Status: None   Collection Time: 05/11/19 12:01 PM   Specimen: Nasopharyngeal Swab  Result Value Ref Range Status   SARS Coronavirus 2 NEGATIVE NEGATIVE Final    Comment: (NOTE) If result is NEGATIVE SARS-CoV-2 target nucleic acids are NOT DETECTED. The SARS-CoV-2 RNA is generally detectable in upper and lower  respiratory specimens during the acute phase of infection. The lowest  concentration of SARS-CoV-2 viral copies this assay can detect is 250  copies / mL. A negative result does not preclude SARS-CoV-2 infection  and should not be used as the sole basis for treatment or other  patient management decisions.  A negative result may occur with  improper specimen collection / handling, submission of specimen other  than nasopharyngeal swab, presence of viral mutation(s) within the  areas targeted by this assay, and inadequate number of viral copies   (<250 copies / mL). A negative result must be combined with clinical  observations, patient history, and epidemiological information. If result is POSITIVE SARS-CoV-2 target nucleic acids are DETECTED. The SARS-CoV-2 RNA is generally detectable in upper and lower  respiratory specimens dur ing the acute phase of infection.  Positive  results are indicative of active infection with SARS-CoV-2.  Clinical  correlation with patient history and other diagnostic information is  necessary to determine patient infection status.  Positive results do  not rule out bacterial infection or co-infection with other viruses. If result is PRESUMPTIVE POSTIVE SARS-CoV-2 nucleic acids MAY BE PRESENT.   A presumptive positive result was obtained on the submitted specimen  and confirmed on repeat testing.  While 2019 novel coronavirus  (SARS-CoV-2) nucleic acids may be present in the submitted sample  additional confirmatory testing may be necessary for epidemiological  and / or clinical management purposes  to differentiate between  SARS-CoV-2 and other Sarbecovirus currently known to infect humans.  If clinically indicated additional testing with an alternate test  methodology [phone]) is advised. The SARS-CoV-2 RNA is generally  detectable in upper and lower respiratory sp ecimens during the acute  phase of infection. The expected result is Negative. Fact Sheet for Patients:  StrictlyIdeas.no Fact Sheet for Healthcare Providers: BankingDealers.co.za This test is not yet approved or cleared by the [place] FDA and has been authorized for detection and/or diagnosis of SARS-CoV-2 by FDA under an Emergency Use Authorization (EUA).  This EUA will remain in effect (meaning this test can be used) for the duration of the COVID-19 declaration under Section 564(b)(1) of the Act, 21 U.S.C. section 360bbb-3(b)(1), unless the authorization is terminated  or revoked sooner. Performed at [facility], [address] [place], [place] [address]   Blood Culture (routine x 2)     Status: None (Preliminary result)   Collection Time: 05/11/19  9:59 PM   Specimen: BLOOD  Result Value Ref Range Status   Specimen Description BLOOD BLOOD RIGHT HAND  Final   Special Requests   Final    BOTTLES DRAWN AEROBIC AND ANAEROBIC Blood Culture adequate volume   Culture   Final    NO GROWTH 3 DAYS Performed at [facility], [address], [place], [place] [address]    Report Status PENDING  Incomplete  MRSA PCR Screening     Status: Abnormal   Collection Time: 05/12/19  7:01 PM   Specimen: Nasal Mucosa; Nasopharyngeal  Result Value Ref Range Status   MRSA by PCR POSITIVE (A) NEGATIVE Final    Comment:        The GeneXpert MRSA Assay (FDA approved for NASAL specimens only), is one component of a comprehensive MRSA colonization surveillance program. It is not intended to diagnose MRSA infection nor to guide or monitor treatment for MRSA infections. CRITICAL RESULT CALLED TO, READ BACK BY AND VERIFIED WITH: Jacqulynn Cadet AT 2103 05/12/2019 KMP Performed at [facility], [name] [place], [place] [address]  Coagulation Studies: No results for input(s): LABPROT, INR in the last 72 hours.  Urinalysis: No results for input(s): COLORURINE, LABSPEC, PHURINE, GLUCOSEU, HGBUR, BILIRUBINUR, KETONESUR, PROTEINUR, UROBILINOGEN, NITRITE, LEUKOCYTESUR in the last 168 hours.  Invalid input(s): APPERANCEUR  Lipid Panel:     Component Value Date/Time   CHOL 100 08/03/2012 0250   TRIG 235 (H) 08/03/2012 0250   HDL 31 (L) 08/03/2012 0250   VLDL 47 (H) 08/03/2012 0250   LDLCALC 22 08/03/2012 0250    HgbA1C:  Lab Results  Component Value Date   HGBA1C 6.2 (H) 05/10/2018    Urine Drug Screen:  No results found for: LABOPIA, COCAINSCRNUR, LABBENZ, AMPHETMU, THCU, LABBARB  Alcohol Level: No results for input(s):  ETH in the last 168 hours.  Other results: EKG: sinus rhythm at 93 bpm.  Imaging: EXAM: CT HEAD WITHOUT CONTRAST  TECHNIQUE: Contiguous axial images were obtained from the base of the skull through the vertex without intravenous contrast.  COMPARISON:  Head CT 05/11/2017  FINDINGS: Brain: Stable age related cerebral atrophy, ventriculomegaly and periventricular white matter disease. No extra-axial fluid collections are identified. No CT findings for acute hemispheric infarction or intracranial hemorrhage. No mass lesions. The brainstem and cerebellum are normal.  Vascular: Stable vascular calcifications. No aneurysm or hyperdense vessels.  Skull: No skull fracture or bone lesions.  Sinuses/Orbits: The paranasal sinuses and mastoid air cells are clear. The globes are intact.  Other: No scalp lesions or hematoma.  IMPRESSION: 1. Stable age related cerebral atrophy, ventriculomegaly and periventricular white matter disease. 2. No acute intracranial findings or mass lesions.   Assessment/Plan: 78 year old female presenting with confusion, SOB, hypoxia and abdominal pain.  Felt to have PNA. Started on Cefepime.  White blood cell count not significantly improved.  Has been afebrile since 10/23 with only low grade temps noted since that time.  Head CT reviewed and shows no acute changes.  Mental status may represent a metabolic/toxic encephalopathy related to infection.  As she continues to improve, mental status may lag behind improvement in infection.  Will rule out other possible etiologies.    Recommendations: 1. EEG 2. MRI of the brain without contrast 3. Neuro checks 4. Would consider change in antibiotics to one with less possible neurological side effects.       Alexis Goodell, MD Neurology [phone] 05/14/2019, 2:23 PM

## 2019-05-14 NOTE — Progress Notes (Signed)
Barbara [name]  MRN: [id number]  DOB/AGE: 1941-05-16 78 y.o.  Primary Care Physician:Johnston, Jenny Reichmann, MD  Admit date: 05/11/2019  Chief Complaint:  Chief Complaint  Patient presents with  . Weakness    S-Pt presented on  05/11/2019 with  Chief Complaint  Patient presents with  . Weakness  .    Pt is confused, unable to offer any complaints    Meds  . chlorhexidine  15 mL Mouth/Throat Q4H  . Chlorhexidine Gluconate Cloth  6 each Topical Daily  . cloNIDine  0.2 mg Transdermal Weekly  . heparin  5,000 Units Subcutaneous Q8H  . influenza vaccine adjuvanted  0.5 mL Intramuscular Tomorrow-1000  . lidocaine (PF)  5 mL Intradermal Once  . mupirocin ointment  1 application Nasal BID  . pantoprazole (PROTONIX) IV  40 mg Intravenous Q12H         [id number] to all systems reviewed.  Physical Exam: Vital signs in last 24 hours: Temp:  [98.7 F (37.1 C)-99.1 F (37.3 C)] 99 F (37.2 C) (10/25 0541) Pulse Rate:  [87-95] 87 (10/25 0755) Resp:  [18-20] 20 (10/25 0541) BP: (132-185)/(51-73) 181/73 (10/25 0755) SpO2:  [96 %-98 %] 98 % (10/25 0755) Weight change:  Last BM Date: 05/13/19  Intake/Output from previous day: 10/24 0701 - 10/25 0700 In: -  Out: 50 [Urine:50] No intake/output data recorded.   Physical Exam: General- pt is awake,but not  oriented to time place and person Resp- No acute REsp distress, CTA B/L NO Rhonchi CVS- S1S2 regular in rate and rhythm GIT- BS+, soft, NT, ND EXT- NO LE Edema, Cyanosis Access-left AV graft  Lab Results: CBC Recent Labs    05/11/19 1055 05/13/19 0650  WBC 13.6* 13.2*  HGB 11.3* 10.9*  HCT 36.3 35.3*  PLT 199 180    BMET Recent Labs    05/11/19 1055 05/13/19 0650  NA 137 139  K 3.5 3.8  CL 99 98  CO2 25 23  GLUCOSE 109* 117*  BUN 27* 33*  CREATININE 7.49* 7.39*  CALCIUM 8.6* 8.9    MICRO Recent Results (from the past 240 hour(s))  Blood Culture (routine x 2)     Status: Abnormal (Preliminary result)    Collection Time: 05/11/19 11:21 AM   Specimen: BLOOD  Result Value Ref Range Status   Specimen Description   Final    BLOOD RIGHT ANTECUBITAL Performed at [facility], [address], [place], [place] [address]    Special Requests   Final    BOTTLES DRAWN AEROBIC AND ANAEROBIC Blood Culture adequate volume Performed at [facility], [address] [place], [place] [address]    Culture  Setup Time   Final    GRAM NEGATIVE RODS ANAEROBIC BOTTLE ONLY CRITICAL RESULT CALLED TO, READ BACK BY AND VERIFIED WITH: JASON ROBBINS AT 4270 05/12/2019 KMP    Culture (A)  Final    ANAEROBIC GRAM NEGATIVE ROD CULTURE REINCUBATED FOR BETTER GROWTH Performed at [facility], [place] [address], [place], [place] [address]    Report Status PENDING  Incomplete  Urine culture     Status: Abnormal   Collection Time: 05/11/19 11:21 AM   Specimen: Urine, Random  Result Value Ref Range Status   Specimen Description   Final    URINE, RANDOM Performed at [facility], [address] [place],  [address]    Special Requests   Final    NONE Performed at [facility], [place],  [place] [address]    Culture >=100,000 COLONIES/mL ESCHERICHIA COLI (A)  Final   Report Status 05/13/2019 FINAL  Final   Organism ID, Bacteria ESCHERICHIA COLI (A)  Final      Susceptibility   Escherichia coli - MIC*    AMPICILLIN <=2 SENSITIVE Sensitive     CEFAZOLIN <=4 SENSITIVE Sensitive     CEFTRIAXONE <=1 SENSITIVE Sensitive     CIPROFLOXACIN <=0.25 SENSITIVE Sensitive     GENTAMICIN <=1 SENSITIVE Sensitive     IMIPENEM <=0.25 SENSITIVE Sensitive     NITROFURANTOIN <=16 SENSITIVE Sensitive     TRIMETH/SULFA <=20 SENSITIVE Sensitive     AMPICILLIN/SULBACTAM <=2 SENSITIVE Sensitive     PIP/TAZO <=4 SENSITIVE Sensitive     Extended ESBL NEGATIVE Sensitive     * >=100,000 COLONIES/mL ESCHERICHIA COLI  Blood Culture ID Panel (Reflexed)     Status: None    Collection Time: 05/11/19 11:21 AM  Result Value Ref Range Status   Enterococcus species NOT DETECTED NOT DETECTED Final   Listeria monocytogenes NOT DETECTED NOT DETECTED Final   Staphylococcus species NOT DETECTED NOT DETECTED Final   Staphylococcus aureus (BCID) NOT DETECTED NOT DETECTED Final   Streptococcus species NOT DETECTED NOT DETECTED Final   Streptococcus agalactiae NOT DETECTED NOT DETECTED Final   Streptococcus pneumoniae NOT DETECTED NOT DETECTED Final   Streptococcus pyogenes NOT DETECTED NOT DETECTED Final   Acinetobacter baumannii NOT DETECTED NOT DETECTED Final   Enterobacteriaceae species NOT DETECTED NOT DETECTED Final   Enterobacter cloacae complex NOT DETECTED NOT DETECTED Final   Escherichia coli NOT DETECTED NOT DETECTED Final   Klebsiella oxytoca NOT DETECTED NOT DETECTED Final   Klebsiella pneumoniae NOT DETECTED NOT DETECTED Final   Proteus species NOT DETECTED NOT DETECTED Final   Serratia marcescens NOT DETECTED NOT DETECTED Final   Haemophilus influenzae NOT DETECTED NOT DETECTED Final   Neisseria meningitidis NOT DETECTED NOT DETECTED Final   Pseudomonas aeruginosa NOT DETECTED NOT DETECTED Final   Candida albicans NOT DETECTED NOT DETECTED Final   Candida glabrata NOT DETECTED NOT DETECTED Final   Candida krusei NOT DETECTED NOT DETECTED Final   Candida parapsilosis NOT DETECTED NOT DETECTED Final   Candida tropicalis NOT DETECTED NOT DETECTED Final    Comment: Performed at [facility], [address] [place], [place] [address]  SARS Coronavirus 2 by RT PCR (hospital order, performed in [place] hospital lab) Nasopharyngeal Nasopharyngeal Swab     Status: None   Collection Time: 05/11/19 12:01 PM   Specimen: Nasopharyngeal Swab  Result Value Ref Range Status   SARS Coronavirus 2 NEGATIVE NEGATIVE Final    Comment: (NOTE) If result is NEGATIVE SARS-CoV-2 target nucleic acids are NOT DETECTED. The SARS-CoV-2 RNA is generally  detectable in upper and lower  respiratory specimens during the acute phase of infection. The lowest  concentration of SARS-CoV-2 viral copies this assay can detect is 250  copies / mL. A negative result does not preclude SARS-CoV-2 infection  and should not be used as the sole basis for treatment or other  patient management decisions.  A negative result may occur with  improper specimen collection / handling, submission of specimen other  than nasopharyngeal swab, presence of viral mutation(s) within the  areas targeted by this assay, and inadequate number of viral copies  (<250 copies / mL). A negative result must be combined with clinical  observations, patient history, and epidemiological information. If result is POSITIVE SARS-CoV-2 target nucleic acids are DETECTED. The SARS-CoV-2  RNA is generally detectable in upper and lower  respiratory specimens dur ing the acute phase of infection.  Positive  results are indicative of active infection with SARS-CoV-2.  Clinical  correlation with patient history and other diagnostic information is  necessary to determine patient infection status.  Positive results do  not rule out bacterial infection or co-infection with other viruses. If result is PRESUMPTIVE POSTIVE SARS-CoV-2 nucleic acids MAY BE PRESENT.   A presumptive positive result was obtained on the submitted specimen  and confirmed on repeat testing.  While 2019 novel coronavirus  (SARS-CoV-2) nucleic acids may be present in the submitted sample  additional confirmatory testing may be necessary for epidemiological  and / or clinical management purposes  to differentiate between  SARS-CoV-2 and other Sarbecovirus currently known to infect humans.  If clinically indicated additional testing with an alternate test  methodology [phone]) is advised. The SARS-CoV-2 RNA is generally  detectable in upper and lower respiratory sp ecimens during the acute  phase of infection. The  expected result is Negative. Fact Sheet for Patients:  StrictlyIdeas.no Fact Sheet for Healthcare Providers: BankingDealers.co.za This test is not yet approved or cleared by the [place] FDA and has been authorized for detection and/or diagnosis of SARS-CoV-2 by FDA under an Emergency Use Authorization (EUA).  This EUA will remain in effect (meaning this test can be used) for the duration of the COVID-19 declaration under Section 564(b)(1) of the Act, 21 U.S.C. section 360bbb-3(b)(1), unless the authorization is terminated or revoked sooner. Performed at [facility], [address] [place], [place] [address]   Blood Culture (routine x 2)     Status: None (Preliminary result)   Collection Time: 05/11/19  9:59 PM   Specimen: BLOOD  Result Value Ref Range Status   Specimen Description BLOOD BLOOD RIGHT HAND  Final   Special Requests   Final    BOTTLES DRAWN AEROBIC AND ANAEROBIC Blood Culture adequate volume   Culture   Final    NO GROWTH 3 DAYS Performed at [facility], [address] [place], [place] [address]    Report Status PENDING  Incomplete  MRSA PCR Screening     Status: Abnormal   Collection Time: 05/12/19  7:01 PM   Specimen: Nasal Mucosa; Nasopharyngeal  Result Value Ref Range Status   MRSA by PCR POSITIVE (A) NEGATIVE Final    Comment:        The GeneXpert MRSA Assay (FDA approved for NASAL specimens only), is one component of a comprehensive MRSA colonization surveillance program. It is not intended to diagnose MRSA infection nor to guide or monitor treatment for MRSA infections. CRITICAL RESULT CALLED TO, READ BACK BY AND VERIFIED WITH: Jacqulynn Cadet AT 2103 05/12/2019 KMP Performed at [facility], [address] [place], [place] [address]       Lab Results  Component Value Date   CALCIUM 8.9 05/13/2019   PHOS 4.1 07/27/2018                Impression:  Barbara [name] is a 78 y.o.  African-American female with end stage renal disease on hemodialysis, hypertension, diabetes mellitus type II, diabetic neuropathy who is admitted to [facility] on 05/11/2019 for Sepsis with acute hypoxic respiratory failure, due to unspecified organism, unspecified whether septic shock present ([place]) [A41.9, R65.20, J96.01]   1)Renal ESRD               Patient is on hemodialysis  Patient is on Monday Wednesday Friday schedule               No need for hemodialysis today               We will dialyze on Monday  2)HTN Blood pressure stable   3)Anemia of chronic disease  HGb at goal (9--11) Will keep on Epogen during dialysis  4) secondary hyperparathyroidism  CKD Mineral-Bone Disorder PTH  over suppressed Secondary Hyperparathyroidism  Present Patient was on cinacalcet which is now being held Phosphorus at goal.   5) sepsis Patient is on broad-spectrum antibiotics Primary  MD following  6) electrolytes Normokalemic NOrmonatremic   7)Acid base Co2 at goal     Plan:  We will dialyze on Monday We will use 3K bath as patient potassium runs on the low side We Will give  Epogen then Will decide q. weekly dose depending upon her response    Eldene Plocher s [facility] 05/14/2019, 8:45 AM

## 2019-05-14 NOTE — Progress Notes (Signed)
[place] [facility] [place], is a 78 y.o. female, DOB - Feb 19, 1941, TSV:[id number]  Admit date - 05/11/2019   Admitting Physician Dustin Flock, MD  Outpatient Primary MD for the patient is [name], MD   LOS - 3  Subjective: Patient continues to remain the same not able to communicate    Review of Systems:   CONSTITUTIONAL:unable to provide  Vitals:   Vitals:   05/13/19 1254 05/13/19 2132 05/14/19 0541 05/14/19 0755  BP: (!) 132/51 (!) 157/64 (!) 185/68 (!) 181/73  Pulse: 88 89 95 87  Resp: 18 20 20    Temp: 98.7 F (37.1 C) 99.1 F (37.3 C) 99 F (37.2 C)   TempSrc: Oral Oral Oral   SpO2: 97% 97% 96% 98%  Weight:      Height:        Wt Readings from Last 3 Encounters:  05/12/19 82 kg  02/28/19 81.6 kg  02/07/19 81.6 kg     Intake/Output Summary (Last 24 hours) at 05/14/2019 1200 Last data filed at 05/13/2019 2114 Gross per 24 hour  Intake -  Output 50 ml  Net -50 ml    Physical Exam:   GENERAL: obsese chronically ill appearing  HEAD, EYES, EARS, NOSE AND THROAT: Atraumatic, normocephalic. Extraocular muscles are intact. Pupils equal and reactive to light. Sclerae anicteric. No conjunctival injection. No oro-pharyngeal erythema.  NECK: Supple. There is no jugular venous distention. No bruits, no lymphadenopathy, no thyromegaly.  HEART: Regular rate and rhythm,. No murmurs, no rubs, no clicks.  LUNGS: Clear to auscultation bilaterally. No rales or rhonchi. No wheezes.  ABDOMEN: Soft, flat, nontender, nondistended. Has good bowel sounds. No hepatosplenomegaly appreciated.  EXTREMITIES: No evidence of any cyanosis, clubbing, or peripheral edema.  +2 pedal and radial pulses bilaterally.  NEUROLOGIC: Patient awake but not  communicating SKIN: Moist and warm with no rashes appreciated.  Psych: Not anxious, depressed LN: No inguinal LN enlargement    Antibiotics   Anti-infectives (From admission, onward)   Start     Dose/Rate Route Frequency Ordered Stop   05/15/19 1200  vancomycin (VANCOCIN) IVPB 1000 mg/200 mL premix  Status:  Discontinued     1,000 mg 200 mL/hr over 60 Minutes Intravenous Every M-W-F (Hemodialysis) 05/12/19 1420 05/12/19 1917   05/12/19 1800  ceFEPIme (MAXIPIME) 1 g in sodium chloride 0.9 % 100 mL IVPB     1 g 200 mL/hr over 30  Minutes Intravenous Every 24 hours 05/12/19 1420     05/12/19 1600  vancomycin (VANCOCIN) IVPB 1000 mg/200 mL premix     1,000 mg 200 mL/hr over 60 Minutes Intravenous  Once 05/12/19 1420 05/12/19 1656   05/11/19 1530  cefTRIAXone (ROCEPHIN) 2 g in sodium chloride 0.9 % 100 mL IVPB  Status:  Discontinued     2 g 200 mL/hr over 30 Minutes Intravenous Every 24 hours 05/11/19 1520 05/12/19 0759   05/11/19 1530  azithromycin (ZITHROMAX) 500 mg in sodium chloride 0.9 % 250 mL IVPB  Status:  Discontinued     500 mg 250 mL/hr over 60 Minutes Intravenous Every 24 hours 05/11/19 1520 05/12/19 0759   05/11/19 1300  vancomycin (VANCOCIN) 500 mg in sodium chloride 0.9 % 100 mL IVPB     500 mg 100 mL/hr over 60 Minutes Intravenous  Once 05/11/19 1116 05/11/19 1539   05/11/19 1100  vancomycin (VANCOCIN) IVPB 1000 mg/200 mL premix     1,000 mg 200 mL/hr over 60 Minutes Intravenous  Once 05/11/19 1054 05/11/19 1534   05/11/19 1100  ceFEPIme (MAXIPIME) 2 g in sodium chloride 0.9 % 100 mL IVPB     2 g 200 mL/hr over 30 Minutes Intravenous  Once 05/11/19 1054 05/11/19 1219      Medications   Scheduled Meds: . chlorhexidine  15 mL Mouth/Throat Q4H  . Chlorhexidine Gluconate Cloth  6 each Topical Daily  . cloNIDine  0.2 mg Transdermal Weekly  . [START ON 05/15/2019] epoetin (EPOGEN/PROCRIT) injection  2,000 Units Intravenous Once  . heparin  5,000 Units Subcutaneous Q8H   . influenza vaccine adjuvanted  0.5 mL Intramuscular Tomorrow-1000  . lidocaine (PF)  5 mL Intradermal Once  . mupirocin ointment  1 application Nasal BID  . pantoprazole (PROTONIX) IV  40 mg Intravenous Q12H   Continuous Infusions: . ceFEPime (MAXIPIME) IV 1 g (05/13/19 1730)   PRN Meds:.acetaminophen **OR** acetaminophen, albuterol, iohexol   Data Review:   Micro Results Recent Results (from the past 240 hour(s))  Blood Culture (routine x 2)     Status: Abnormal   Collection Time: 05/11/19 11:21 AM   Specimen: BLOOD  Result Value Ref Range Status   Specimen Description BLOOD RIGHT ANTECUBITAL  Final   Special Requests   Final    BOTTLES DRAWN AEROBIC AND ANAEROBIC Blood Culture adequate volume   Culture  Setup Time   Final    GRAM NEGATIVE RODS ANAEROBIC BOTTLE ONLY CRITICAL RESULT CALLED TO, READ BACK BY AND VERIFIED WITH: JASON ROBBINS AT 1644 05/12/2019 KMP    Culture BACTEROIDES FRAGILIS BETA LACTAMASE POSITIVE  (A)  Final   Report Status 05/14/2019 FINAL  Final  Urine culture     Status: Abnormal   Collection Time: 05/11/19 11:21 AM   Specimen: Urine, Random  Result Value Ref Range Status   Specimen Description   Final    URINE, RANDOM Performed at [facility], [address], [place], [name] [address]    Special Requests   Final    NONE Performed at [facility], [address] [place], [place] [address]    Culture >=100,000 COLONIES/mL ESCHERICHIA COLI (A)  Final   Report Status 05/13/2019 FINAL  Final   Organism ID, Bacteria ESCHERICHIA COLI (A)  Final      Susceptibility   Escherichia coli - MIC*    AMPICILLIN <=2 SENSITIVE Sensitive     CEFAZOLIN <=4 SENSITIVE Sensitive     CEFTRIAXONE <=1 SENSITIVE Sensitive  CIPROFLOXACIN <=0.25 SENSITIVE Sensitive     GENTAMICIN <=1 SENSITIVE Sensitive     IMIPENEM <=0.25 SENSITIVE Sensitive     NITROFURANTOIN <=16 SENSITIVE Sensitive     TRIMETH/SULFA <=20 SENSITIVE Sensitive      AMPICILLIN/SULBACTAM <=2 SENSITIVE Sensitive     PIP/TAZO <=4 SENSITIVE Sensitive     Extended ESBL NEGATIVE Sensitive     * >=100,000 COLONIES/mL ESCHERICHIA COLI  Blood Culture ID Panel (Reflexed)     Status: None   Collection Time: 05/11/19 11:21 AM  Result Value Ref Range Status   Enterococcus species NOT DETECTED NOT DETECTED Final   Listeria monocytogenes NOT DETECTED NOT DETECTED Final   Staphylococcus species NOT DETECTED NOT DETECTED Final   Staphylococcus aureus (BCID) NOT DETECTED NOT DETECTED Final   Streptococcus species NOT DETECTED NOT DETECTED Final   Streptococcus agalactiae NOT DETECTED NOT DETECTED Final   Streptococcus pneumoniae NOT DETECTED NOT DETECTED Final   Streptococcus pyogenes NOT DETECTED NOT DETECTED Final   Acinetobacter baumannii NOT DETECTED NOT DETECTED Final   Enterobacteriaceae species NOT DETECTED NOT DETECTED Final   Enterobacter cloacae complex NOT DETECTED NOT DETECTED Final   Escherichia coli NOT DETECTED NOT DETECTED Final   Klebsiella oxytoca NOT DETECTED NOT DETECTED Final   Klebsiella pneumoniae NOT DETECTED NOT DETECTED Final   Proteus species NOT DETECTED NOT DETECTED Final   Serratia marcescens NOT DETECTED NOT DETECTED Final   Haemophilus influenzae NOT DETECTED NOT DETECTED Final   Neisseria meningitidis NOT DETECTED NOT DETECTED Final   Pseudomonas aeruginosa NOT DETECTED NOT DETECTED Final   Candida albicans NOT DETECTED NOT DETECTED Final   Candida glabrata NOT DETECTED NOT DETECTED Final   Candida krusei NOT DETECTED NOT DETECTED Final   Candida parapsilosis NOT DETECTED NOT DETECTED Final   Candida tropicalis NOT DETECTED NOT DETECTED Final    Comment: Performed at [facility], [address] [place], [place] [address]  SARS Coronavirus 2 by RT PCR (hospital order, performed in [place] hospital lab) Nasopharyngeal Nasopharyngeal Swab     Status: None   Collection Time: 05/11/19 12:01 PM   Specimen:  Nasopharyngeal Swab  Result Value Ref Range Status   SARS Coronavirus 2 NEGATIVE NEGATIVE Final    Comment: (NOTE) If result is NEGATIVE SARS-CoV-2 target nucleic acids are NOT DETECTED. The SARS-CoV-2 RNA is generally detectable in upper and lower  respiratory specimens during the acute phase of infection. The lowest  concentration of SARS-CoV-2 viral copies this assay can detect is 250  copies / mL. A negative result does not preclude SARS-CoV-2 infection  and should not be used as the sole basis for treatment or other  patient management decisions.  A negative result may occur with  improper specimen collection / handling, submission of specimen other  than nasopharyngeal swab, presence of viral mutation(s) within the  areas targeted by this assay, and inadequate number of viral copies  (<250 copies / mL). A negative result must be combined with clinical  observations, patient history, and epidemiological information. If result is POSITIVE SARS-CoV-2 target nucleic acids are DETECTED. The SARS-CoV-2 RNA is generally detectable in upper and lower  respiratory specimens dur ing the acute phase of infection.  Positive  results are indicative of active infection with SARS-CoV-2.  Clinical  correlation with patient history and other diagnostic information is  necessary to determine patient infection status.  Positive results do  not rule out bacterial infection or co-infection with other viruses. If result is PRESUMPTIVE POSTIVE SARS-CoV-2 nucleic  acids MAY BE PRESENT.   A presumptive positive result was obtained on the submitted specimen  and confirmed on repeat testing.  While 2019 novel coronavirus  (SARS-CoV-2) nucleic acids may be present in the submitted sample  additional confirmatory testing may be necessary for epidemiological  and / or clinical management purposes  to differentiate between  SARS-CoV-2 and other Sarbecovirus currently known to infect humans.  If clinically  indicated additional testing with an alternate test  methodology [phone]) is advised. The SARS-CoV-2 RNA is generally  detectable in upper and lower respiratory sp ecimens during the acute  phase of infection. The expected result is Negative. Fact Sheet for Patients:  StrictlyIdeas.no Fact Sheet for Healthcare Providers: BankingDealers.co.za This test is not yet approved or cleared by the [place] FDA and has been authorized for detection and/or diagnosis of SARS-CoV-2 by FDA under an Emergency Use Authorization (EUA).  This EUA will remain in effect (meaning this test can be used) for the duration of the COVID-19 declaration under Section 564(b)(1) of the Act, 21 U.S.C. section 360bbb-3(b)(1), unless the authorization is terminated or revoked sooner. Performed at [facility], [address] [place], [place] [address]   Blood Culture (routine x 2)     Status: None (Preliminary result)   Collection Time: 05/11/19  9:59 PM   Specimen: BLOOD  Result Value Ref Range Status   Specimen Description BLOOD BLOOD RIGHT HAND  Final   Special Requests   Final    BOTTLES DRAWN AEROBIC AND ANAEROBIC Blood Culture adequate volume   Culture   Final    NO GROWTH 3 DAYS Performed at [facility], [address] [place], [place] [address]    Report Status PENDING  Incomplete  MRSA PCR Screening     Status: Abnormal   Collection Time: 05/12/19  7:01 PM   Specimen: Nasal Mucosa; Nasopharyngeal  Result Value Ref Range Status   MRSA by PCR POSITIVE (A) NEGATIVE Final    Comment:        The GeneXpert MRSA Assay (FDA approved for NASAL specimens only), is one component of a comprehensive MRSA colonization surveillance program. It is not intended to diagnose MRSA infection nor to guide or monitor treatment for MRSA infections. CRITICAL RESULT CALLED TO, READ BACK BY AND VERIFIED WITH: Jacqulynn Cadet AT 2103 05/12/2019  KMP Performed at [facility], [address] [place], [place] [address]     Radiology Reports Ct Abdomen Pelvis Wo Contrast  Result Date: 05/11/2019 CLINICAL DATA:  Abdominal pain, fever.  History of bladder cancer EXAM: CT ABDOMEN AND PELVIS WITHOUT CONTRAST TECHNIQUE: Multidetector CT imaging of the abdomen and pelvis was performed following the standard protocol without IV contrast. COMPARISON:  01/19/2018 FINDINGS: Lower chest: Small bilateral pleural effusions. Cardiomegaly. Bibasilar airspace opacities likely atelectasis. Hepatobiliary: No focal hepatic abnormality. Gallbladder unremarkable. Pancreas: No focal abnormality or ductal dilatation. Spleen: No focal abnormality.  Normal size. Adrenals/Urinary Tract: Severe bilateral hydroureteronephrosis, unchanged. Bladder wall is markedly thickened and stable since prior study. No visible adrenal lesion. Stomach/Bowel: Stomach, large and small bowel grossly unremarkable. Vascular/Lymphatic: Heavily calcified aorta and iliac vessels. Right common iliac lymph node again noted with a short axis diameter of 1.5 cm, stable. Right inguinal lymph node has a short axis diameter of 7 mm, stable. Reproductive: Prior hysterectomy.  No adnexal masses. Other: No free fluid or free air. Musculoskeletal: No acute bony abnormality. IMPRESSION: Continued marked bladder wall thickening and severe bilateral hydronephrosis. Findings similar to prior study.  Heavily calcified aorta and iliac vessels. New small bilateral pleural effusions. Bibasilar opacities, likely atelectasis. Stable mildly enlarged and borderline sized right common iliac and right inguinal lymph nodes. Electronically Signed   By: Rolm Baptise M.D.   On: 05/11/2019 13:51   Ct Head Wo Contrast  Result Date: 05/12/2019 CLINICAL DATA:  Altered mental status. EXAM: CT HEAD WITHOUT CONTRAST TECHNIQUE: Contiguous axial images were obtained from the base of the skull through the vertex without  intravenous contrast. COMPARISON:  Head CT 05/11/2017 FINDINGS: Brain: Stable age related cerebral atrophy, ventriculomegaly and periventricular white matter disease. No extra-axial fluid collections are identified. No CT findings for acute hemispheric infarction or intracranial hemorrhage. No mass lesions. The brainstem and cerebellum are normal. Vascular: Stable vascular calcifications. No aneurysm or hyperdense vessels. Skull: No skull fracture or bone lesions. Sinuses/Orbits: The paranasal sinuses and mastoid air cells are clear. The globes are intact. Other: No scalp lesions or hematoma. IMPRESSION: 1. Stable age related cerebral atrophy, ventriculomegaly and periventricular white matter disease. 2. No acute intracranial findings or mass lesions. Electronically Signed   By: Marijo Sanes M.D.   On: 05/12/2019 08:51   Dg Chest Port 1 View  Result Date: 05/11/2019 CLINICAL DATA:  Sepsis EXAM: PORTABLE CHEST 1 VIEW COMPARISON:  05/10/2018 FINDINGS: Cardiac shadow is mildly prominent but accentuated by the portable technique. Further stenting in the left innominate vein and left subclavian vein is noted. Right chest wall port is been removed in the interval. Diffuse vascular congestion is noted with mild interstitial edema. This may be related to a degree of volume overload related to the end-stage renal failure. No focal confluent infiltrate is seen. No sizable effusion is noted. No bony abnormality is noted. IMPRESSION: Vascular congestion likely related to the known renal failure and volume overload. No other focal abnormality is seen. Electronically Signed   By: Inez Catalina M.D.   On: 05/11/2019 12:11     CBC Recent Labs  Lab 05/11/19 1055 05/13/19 0650  WBC 13.6* 13.2*  HGB 11.3* 10.9*  HCT 36.3 35.3*  PLT 199 180  MCV 98.9 98.9  MCH 30.8 30.5  MCHC 31.1 30.9  RDW 13.4 13.4  LYMPHSABS 0.8  --   MONOABS 0.7  --   EOSABS 0.0  --   BASOSABS 0.0  --     Chemistries  Recent Labs  Lab  05/11/19 1055 05/13/19 0650  NA 137 139  K 3.5 3.8  CL 99 98  CO2 25 23  GLUCOSE 109* 117*  BUN 27* 33*  CREATININE 7.49* 7.39*  CALCIUM 8.6* 8.9  AST 13*  --   ALT 8  --   ALKPHOS 89  --   BILITOT 0.8  --    ------------------------------------------------------------------------------------------------------------------ estimated creatinine clearance is 6.9 mL/min (A) (by C-G formula based on SCr of 7.39 mg/dL (H)). ------------------------------------------------------------------------------------------------------------------ No results for input(s): HGBA1C in the last 72 hours. ------------------------------------------------------------------------------------------------------------------ No results for input(s): CHOL, HDL, LDLCALC, TRIG, CHOLHDL, LDLDIRECT in the last 72 hours. ------------------------------------------------------------------------------------------------------------------ No results for input(s): TSH, T4TOTAL, T3FREE, THYROIDAB in the last 72 hours.  Invalid input(s): FREET3 ------------------------------------------------------------------------------------------------------------------ No results for input(s): VITAMINB12, FOLATE, FERRITIN, TIBC, IRON, RETICCTPCT in the last 72 hours.  Coagulation profile No results for input(s): INR, PROTIME in the last 168 hours.  No results for input(s): DDIMER in the last 72 hours.  Cardiac Enzymes No results for input(s): CKMB, TROPONINI, MYOGLOBIN in the last 168 hours.  Invalid input(s): CK ------------------------------------------------------------------------------------------------------------------ Invalid input(s): POCBNP  Assessment & Plan   IMPRESSION AND PLAN: Patient 78 year old presenting with abdominal pain confusion and hypoxia  1.   pneumonia / uti  Positive blood cultures for E. Coli, continue cefepime  2.  Acute encephalopathy likely due no significant improvement I have  updated her husband, regarding patient's condition I will ask neuro to see  3.  End-stage renal disease nephrology has been consulted for hemodialysis continue PhosLo and Sensipar  4.  Diabetes type 2 continue sliding-scale insulin   5.  Sleep apnea CPAP at bedtime  6.  GERD Protonix IV  5, hypertension hold bp meds      Code Status Orders  (From admission, onward)         Start     Ordered   05/11/19 2137  Full code  Continuous     05/11/19 2136        Code Status History    Date Active Date Inactive Code Status Order ID Comments User Context   07/27/2018 1819 07/30/2018 1640 Full Code [id number]  Sela Hua, MD Inpatient   05/10/2018 1831 05/13/2018 2249 Full Code [id number]  Hillary Bow, MD ED   01/24/2017 2348 01/29/2017 1614 Full Code [id number]  [place], Halsey, DO Inpatient   11/19/2016 1528 11/23/2016 2156 Full Code [id number]  Loletha Grayer, MD Inpatient   06/05/2016 0108 06/08/2016 1345 Full Code [id number]  Lance Coon, MD Inpatient   05/12/2016 1432 05/15/2016 1746 Full Code [id number]  Lytle Butte, MD Inpatient   05/04/2016 1455 05/09/2016 1828 Full Code [id number]  Epifanio Lesches, MD ED   04/17/2016 1336 04/20/2016 2041 Full Code [id number]  Demetrios Loll, MD Inpatient   03/04/2016 1358 03/10/2016 1440 Full Code [id number]  Hillary Bow, MD ED   01/21/2016 2115 01/27/2016 1801 Full Code [id number]  Edwin Dada, MD Inpatient   01/20/2016 1841 01/21/2016 2115 Full Code [id number]  Theodoro Grist, MD Inpatient   12/18/2015 0214 12/23/2015 1651 Full Code [id number]  Saundra Shelling, MD Inpatient   Advance Care Planning Activity           Consults  none   DVT Prophylaxis  Lovenox   Lab Results  Component Value Date   PLT 180 05/13/2019     Time Spent in minutes  47mn  Greater than 50% of time spent in care coordination and counseling patient regarding the condition and plan of care.   SDustin FlockM.D on 05/14/2019 at 12:00  PM  Between 7am to 6pm - Pager - [phone]  After 6pm go to [email or web address] - [email or web address] [employer]   Office  [phone]

## 2019-05-15 LAB — BASIC METABOLIC PANEL
Anion gap: 18 — ABNORMAL HIGH (ref 5–15)
BUN: 58 mg/dL — ABNORMAL HIGH (ref 8–23)
CO2: 24 mmol/L (ref 22–32)
Calcium: 9.3 mg/dL (ref 8.9–10.3)
Chloride: 100 mmol/L (ref 98–111)
Creatinine, Ser: 8.84 mg/dL — ABNORMAL HIGH (ref 0.44–1.00)
GFR calc Af Amer: 4 mL/min — ABNORMAL LOW (ref >60)
GFR calc non Af Amer: 4 mL/min — ABNORMAL LOW (ref >60)
Glucose, Bld: 99 mg/dL (ref 70–99)
Potassium: 4.4 mmol/L (ref 3.5–5.1)
Sodium: 142 mmol/L (ref 135–145)

## 2019-05-15 LAB — MAGNESIUM: Magnesium: 2.1 mg/dL (ref 1.7–2.4)

## 2019-05-15 LAB — GLUCOSE, CAPILLARY: Glucose-Capillary: 99 mg/dL (ref 70–99)

## 2019-05-15 MED ORDER — SODIUM CHLORIDE 0.9 % IV SOLN
3.0000 g | Freq: Two times a day (BID) | INTRAVENOUS | Status: DC
  Administered 2019-05-15 – 2019-05-26 (×22): 3 g via INTRAVENOUS
  Filled 2019-05-15 (×2): qty 8
  Filled 2019-05-15: qty 3
  Filled 2019-05-15 (×3): qty 8
  Filled 2019-05-15 (×2): qty 3
  Filled 2019-05-15 (×2): qty 8
  Filled 2019-05-15 (×3): qty 3
  Filled 2019-05-15: qty 8
  Filled 2019-05-15 (×8): qty 3
  Filled 2019-05-15 (×2): qty 8

## 2019-05-15 MED ORDER — SODIUM CHLORIDE 0.9% FLUSH
3.0000 mL | Freq: Two times a day (BID) | INTRAVENOUS | Status: DC
  Administered 2019-05-15 – 2019-05-25 (×20): 3 mL via INTRAVENOUS

## 2019-05-15 NOTE — Progress Notes (Signed)
Pre HD Assessment    05/15/19 0940  Neurological  Level of Consciousness Alert  Orientation Level Disoriented X4  Respiratory  Respiratory Pattern Regular  Chest Assessment Chest expansion symmetrical  Bilateral Breath Sounds Clear;Diminished  Cough None  Cardiac  Pulse Regular  ECG Monitor Yes  Cardiac Rhythm NSR  Vascular  R Radial Pulse +2  L Radial Pulse +2  Edema Generalized

## 2019-05-15 NOTE — Progress Notes (Signed)
HD Tx stopped due to clotted system, MD aware will not continue, 2.5hrs TX completed    05/15/19 1215  Vital Signs  Pulse Rate 98  Pulse Rate Source Monitor  Resp 17  BP (!) 110/52  BP Location Right Arm  BP Method Automatic  Patient Position (if appropriate) Lying  Dialysis Weight  Weight 76.1 kg  Type of Weight Post-Dialysis  During Hemodialysis Assessment  HD Safety Checks Performed Yes  KECN 39.8 KECN  Dialysis Fluid Bolus Normal Saline  Bolus Amount (mL) 250 mL  Intra-Hemodialysis Comments Tx completed;See progress note

## 2019-05-15 NOTE — Progress Notes (Signed)
HD Tx started w/o issue    05/15/19 0952  During Hemodialysis Assessment  Blood Flow Rate (mL/min) 400 mL/min  Arterial Pressure (mmHg) -190 mmHg  Venous Pressure (mmHg) 170 mmHg  Transmembrane Pressure (mmHg) 60 mmHg  Ultrafiltration Rate (mL/min) 300 mL/min  Dialysate Flow Rate (mL/min) 800 ml/min  Conductivity: Machine  13.6  HD Safety Checks Performed Yes  Dialysis Fluid Bolus Normal Saline  Bolus Amount (mL) 250 mL  Intra-Hemodialysis Comments Tx initiated  Fistula / Graft Left Upper arm Arteriovenous vein graft  Placement Date/Time: 02/17/17 1111   Orientation: Left  Access Location: Upper arm  Access Type: Arteriovenous vein graft  Status Accessed  Needle Size 15g  Drainage Description None

## 2019-05-15 NOTE — Progress Notes (Signed)
[facility], [place] 05/15/19  Subjective:   Patient seen during dialysis Tolerating well    HEMODIALYSIS FLOWSHEET:  Blood Flow Rate (mL/min): 400 mL/min Arterial Pressure (mmHg): -190 mmHg Venous Pressure (mmHg): 170 mmHg Transmembrane Pressure (mmHg): 60 mmHg Ultrafiltration Rate (mL/min): 300 mL/min Dialysate Flow Rate (mL/min): 800 ml/min Conductivity: Machine : 13.6 Conductivity: Machine : 13.6 Dialysis Fluid Bolus: Normal Saline Bolus Amount (mL): 250 mL  Follows with her eyes.  Did not answer any questions.  Objective:  Vital signs in last 24 hours:  Temp:  [98.4 F (36.9 C)-98.9 F (37.2 C)] 98.4 F (36.9 C) (10/26 1220) Pulse Rate:  [81-100] 95 (10/26 1220) Resp:  [16-22] 22 (10/26 1220) BP: (92-146)/(46-88) 109/59 (10/26 1220) SpO2:  [98 %-99 %] 98 % (10/26 0947) Weight:  [76.1 kg-80 kg] 76.1 kg (10/26 1220)  Weight change:  Filed Weights   05/15/19 0947 05/15/19 1215 05/15/19 1220  Weight: 80 kg 76.1 kg 76.1 kg    Intake/Output:    Intake/Output Summary (Last 24 hours) at 05/15/2019 1552 Last data filed at 05/15/2019 1220 Gross per 24 hour  Intake 320.99 ml  Output 337 ml  Net -16.01 ml     Physical Exam: General:  Lying in the bed, no acute distress  HEENT  moist oral mucous membranes  Pulm/lungs  normal breathing effort, clear  CVS/Heart  no rub  Abdomen:   Soft, nontender  Extremities:  No peripheral edema  Neurologic:  Follows in the room, did not follow any commands  Skin:  Warm, dry  Access:  Left arm AV graft       Basic Metabolic Panel:  Recent Labs  Lab 05/11/19 1055 05/13/19 0650  NA 137 139  K 3.5 3.8  CL 99 98  CO2 25 23  GLUCOSE 109* 117*  BUN 27* 33*  CREATININE 7.49* 7.39*  CALCIUM 8.6* 8.9     CBC: Recent Labs  Lab 05/11/19 1055 05/13/19 0650  WBC 13.6* 13.2*  NEUTROABS 12.0*  --   HGB 11.3* 10.9*  HCT 36.3 35.3*  MCV 98.9 98.9  PLT 199 180      Lab Results   Component Value Date   HEPBSAG NON REACTIVE 05/12/2019      Microbiology:  Recent Results (from the past 240 hour(s))  Blood Culture (routine x 2)     Status: Abnormal   Collection Time: 05/11/19 11:21 AM   Specimen: BLOOD  Result Value Ref Range Status   Specimen Description BLOOD RIGHT ANTECUBITAL  Final   Special Requests   Final    BOTTLES DRAWN AEROBIC AND ANAEROBIC Blood Culture adequate volume   Culture  Setup Time   Final    GRAM NEGATIVE RODS ANAEROBIC BOTTLE ONLY CRITICAL RESULT CALLED TO, READ BACK BY AND VERIFIED WITH: JASON ROBBINS AT 1644 05/12/2019 KMP    Culture BACTEROIDES FRAGILIS BETA LACTAMASE POSITIVE  (A)  Final   Report Status 05/14/2019 FINAL  Final  Urine culture     Status: Abnormal   Collection Time: 05/11/19 11:21 AM   Specimen: Urine, Random  Result Value Ref Range Status   Specimen Description   Final    URINE, RANDOM Performed at [facility], [address] [place], [place] [address]    Special Requests   Final    NONE Performed at [facility], [address] [place], [place] [address]    Culture >=100,000 COLONIES/mL ESCHERICHIA COLI (A)  Final   Report Status 05/13/2019 FINAL  Final   Organism ID, Bacteria ESCHERICHIA COLI (A)  Final      Susceptibility   Escherichia coli - MIC*    AMPICILLIN <=2 SENSITIVE Sensitive     CEFAZOLIN <=4 SENSITIVE Sensitive     CEFTRIAXONE <=1 SENSITIVE Sensitive     CIPROFLOXACIN <=0.25 SENSITIVE Sensitive     GENTAMICIN <=1 SENSITIVE Sensitive     IMIPENEM <=0.25 SENSITIVE Sensitive     NITROFURANTOIN <=16 SENSITIVE Sensitive     TRIMETH/SULFA <=20 SENSITIVE Sensitive     AMPICILLIN/SULBACTAM <=2 SENSITIVE Sensitive     PIP/TAZO <=4 SENSITIVE Sensitive     Extended ESBL NEGATIVE Sensitive     * >=100,000 COLONIES/mL ESCHERICHIA COLI  Blood Culture ID Panel (Reflexed)     Status: None   Collection Time: 05/11/19 11:21 AM  Result Value Ref Range Status   Enterococcus  species NOT DETECTED NOT DETECTED Final   Listeria monocytogenes NOT DETECTED NOT DETECTED Final   Staphylococcus species NOT DETECTED NOT DETECTED Final   Staphylococcus aureus (BCID) NOT DETECTED NOT DETECTED Final   Streptococcus species NOT DETECTED NOT DETECTED Final   Streptococcus agalactiae NOT DETECTED NOT DETECTED Final   Streptococcus pneumoniae NOT DETECTED NOT DETECTED Final   Streptococcus pyogenes NOT DETECTED NOT DETECTED Final   Acinetobacter baumannii NOT DETECTED NOT DETECTED Final   Enterobacteriaceae species NOT DETECTED NOT DETECTED Final   Enterobacter cloacae complex NOT DETECTED NOT DETECTED Final   Escherichia coli NOT DETECTED NOT DETECTED Final   Klebsiella oxytoca NOT DETECTED NOT DETECTED Final   Klebsiella pneumoniae NOT DETECTED NOT DETECTED Final   Proteus species NOT DETECTED NOT DETECTED Final   Serratia marcescens NOT DETECTED NOT DETECTED Final   Haemophilus influenzae NOT DETECTED NOT DETECTED Final   Neisseria meningitidis NOT DETECTED NOT DETECTED Final   Pseudomonas aeruginosa NOT DETECTED NOT DETECTED Final   Candida albicans NOT DETECTED NOT DETECTED Final   Candida glabrata NOT DETECTED NOT DETECTED Final   Candida krusei NOT DETECTED NOT DETECTED Final   Candida parapsilosis NOT DETECTED NOT DETECTED Final   Candida tropicalis NOT DETECTED NOT DETECTED Final    Comment: Performed at [facility], [address] [place], [place] [address]  SARS Coronavirus 2 by RT PCR (hospital order, performed in [place] hospital lab) Nasopharyngeal Nasopharyngeal Swab     Status: None   Collection Time: 05/11/19 12:01 PM   Specimen: Nasopharyngeal Swab  Result Value Ref Range Status   SARS Coronavirus 2 NEGATIVE NEGATIVE Final    Comment: (NOTE) If result is NEGATIVE SARS-CoV-2 target nucleic acids are NOT DETECTED. The SARS-CoV-2 RNA is generally detectable in upper and lower  respiratory specimens during the acute phase of infection. The  lowest  concentration of SARS-CoV-2 viral copies this assay can detect is 250  copies / mL. A negative result does not preclude SARS-CoV-2 infection  and should not be used as the sole basis for treatment or other  patient management decisions.  A negative result may occur with  improper specimen collection / handling, submission of specimen other  than nasopharyngeal swab, presence of viral mutation(s) within the  areas targeted by this assay, and inadequate number of viral copies  (<250 copies / mL). A negative result must be combined with clinical  observations, patient history, and epidemiological information. If result is POSITIVE SARS-CoV-2 target nucleic acids are DETECTED. The SARS-CoV-2 RNA is generally detectable in upper and lower  respiratory specimens dur ing the acute phase of infection.  Positive  results are indicative of active infection with SARS-CoV-2.  Clinical  correlation with patient history and other diagnostic information is  necessary to determine patient infection status.  Positive results do  not rule out bacterial infection or co-infection with other viruses. If result is PRESUMPTIVE POSTIVE SARS-CoV-2 nucleic acids MAY BE PRESENT.   A presumptive positive result was obtained on the submitted specimen  and confirmed on repeat testing.  While 2019 novel coronavirus  (SARS-CoV-2) nucleic acids may be present in the submitted sample  additional confirmatory testing may be necessary for epidemiological  and / or clinical management purposes  to differentiate between  SARS-CoV-2 and other Sarbecovirus currently known to infect humans.  If clinically indicated additional testing with an alternate test  methodology [id number]) is advised. The SARS-CoV-2 RNA is generally  detectable in upper and lower respiratory sp ecimens during the acute  phase of infection. The expected result is Negative. Fact Sheet for Patients:   StrictlyIdeas.no Fact Sheet for Healthcare Providers: BankingDealers.co.za This test is not yet approved or cleared by the [place] FDA and has been authorized for detection and/or diagnosis of SARS-CoV-2 by FDA under an Emergency Use Authorization (EUA).  This EUA will remain in effect (meaning this test can be used) for the duration of the COVID-19 declaration under Section 564(b)(1) of the Act, 21 U.S.C. section 360bbb-3(b)(1), unless the authorization is terminated or revoked sooner. Performed at [facility], [address] [place], [place] [address]   Blood Culture (routine x 2)     Status: None (Preliminary result)   Collection Time: 05/11/19  9:59 PM   Specimen: BLOOD  Result Value Ref Range Status   Specimen Description BLOOD BLOOD RIGHT HAND  Final   Special Requests   Final    BOTTLES DRAWN AEROBIC AND ANAEROBIC Blood Culture adequate volume   Culture   Final    NO GROWTH 4 DAYS Performed at [facility] [address], [place], [place] [address]    Report Status PENDING  Incomplete  MRSA PCR Screening     Status: Abnormal   Collection Time: 05/12/19  7:01 PM   Specimen: Nasal Mucosa; Nasopharyngeal  Result Value Ref Range Status   MRSA by PCR POSITIVE (A) NEGATIVE Final    Comment:        The GeneXpert MRSA Assay (FDA approved for NASAL specimens only), is one component of a comprehensive MRSA colonization surveillance program. It is not intended to diagnose MRSA infection nor to guide or monitor treatment for MRSA infections. CRITICAL RESULT CALLED TO, READ BACK BY AND VERIFIED WITH: Jacqulynn Cadet AT 2103 05/12/2019 KMP Performed at [facility], [address] [place], [place] [address]     Coagulation Studies: No results for input(s): LABPROT, INR in the last 72 hours.  Urinalysis: No results for input(s): COLORURINE, LABSPEC, PHURINE, GLUCOSEU, HGBUR, BILIRUBINUR,  KETONESUR, PROTEINUR, UROBILINOGEN, NITRITE, LEUKOCYTESUR in the last 72 hours.  Invalid input(s): APPERANCEUR    Imaging: Mr Brain Wo Contrast  Result Date: 05/14/2019 CLINICAL DATA:  Altered mental status (AMS), unclear cause. EXAM: MRI HEAD WITHOUT CONTRAST TECHNIQUE: Multiplanar, multiecho pulse sequences of the brain and surrounding structures were obtained without intravenous contrast. COMPARISON:  Head CT examinations 05/12/2019 and earlier FINDINGS: Brain: Multiple sequences are significantly motion degraded, limiting evaluation. A fast brain protocol was utilized. There is no convincing evidence of acute infarct on mildly motion degraded diffusion weighted imaging. No evidence of intracranial mass. No midline shift or extra-axial fluid collection. No  chronic intracranial blood products. 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 There is otherwise mild scattered and confluent T2/FLAIR hyperintensity within the cerebral white matter consistent with chronic small vessel ischemic disease. Small T2 hyperintense foci within the bilateral basal ganglia likely reflect a combination of prominent perivascular spaces and chronic lacunar infarcts. Mild generalized parenchymal atrophy. Vascular: Flow voids maintained within the proximal large arterial vessels. Skull and upper cervical spine: No focal marrow lesion identified on motion degraded imaging. Incompletely assessed cervical spondylosis. Sinuses/Orbits: Bilateral lens replacements. No significant paranasal sinus disease or mastoid effusion. IMPRESSION: 1. Motion degraded examination. 2. No evidence of acute intracranial abnormality. 3. Generalized parenchymal atrophy with chronic small vessel ischemic disease. 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 Electronically Signed   By: Kellie Simmering DO   On:  05/14/2019 17:23     Medications:   . sodium chloride 250 mL (05/15/19 0620)  . ampicillin-sulbactam (UNASYN) IV     . chlorhexidine  15 mL Mouth/Throat Q4H  . Chlorhexidine Gluconate Cloth  6 each Topical Daily  . cloNIDine  0.2 mg Transdermal Weekly  . epoetin (EPOGEN/PROCRIT) injection  2,000 Units Intravenous Once  . heparin  5,000 Units Subcutaneous Q8H  . influenza vaccine adjuvanted  0.5 mL Intramuscular Tomorrow-1000  . lidocaine (PF)  5 mL Intradermal Once  . mupirocin ointment  1 application Nasal BID  . pantoprazole (PROTONIX) IV  40 mg Intravenous Q12H   sodium chloride, acetaminophen **OR** acetaminophen, albuterol, iohexol  Assessment/ Plan:  78 y.o. African-American female  with end stage renal disease on hemodialysis, hypertension, diabetes mellitus type II, diabetic neuropathy who is admitted to [facility] on 05/11/2019 for [facility] MWF-1//TW84kg//[facility]//left arm AV graft  1.  End-stage renal disease -Seen during dialysis.  Tolerating well  2.  Sepsis with altered mental status Blood cultures from 10/22 are positive for gram-negative rods.  Urine culture positive for E. coli greater than 100,000 colonies CT negative for stroke Neurology team following  3.  Anemia of chronic kidney disease Lab Results  Component Value Date   HGB 10.9 (L) 05/13/2019  Continue low-dose Epogen with hemodialysis  4.  Secondary hyperparathyroidism Lab Results  Component Value Date   CALCIUM 8.9 05/13/2019   PHOS 4.1 07/27/2018   Monitor calcium and phosphorus closely    LOS: [place] 10/26/20203:52 PM  [place], [place]  Note: This note was prepared with Dragon dictation. Any transcription errors are unintentional

## 2019-05-15 NOTE — Progress Notes (Addendum)
Subjective: Patient unchanged.  Remains altered with no speech.  Objective: Current vital signs: BP (!) 109/59 (BP Location: Right Arm)   Pulse 95   Temp 98.4 F (36.9 C) (Oral)   Resp (!) 22   Ht 5' 8"  (1.727 m)   Wt 76.1 kg   SpO2 98%   BMI 25.51 kg/m  Vital signs in last 24 hours: Temp:  [98.4 F (36.9 C)-98.9 F (37.2 C)] 98.4 F (36.9 C) (10/26 1220) Pulse Rate:  [81-100] 95 (10/26 1220) Resp:  [16-22] 22 (10/26 1220) BP: (92-146)/(46-88) 109/59 (10/26 1220) SpO2:  [98 %-99 %] 98 % (10/26 0947) Weight:  [76.1 kg-80 kg] 76.1 kg (10/26 1220)  Intake/Output from previous day: 10/25 0701 - 10/26 0700 In: 321 [I.V.:21; IV Piggyback:300] Out: -  Intake/Output this shift: Total I/O In: -  Out: 337 [Other:337] Nutritional status:  Diet Order            Diet NPO time specified  Diet effective now              Neurologic Exam: Mental Status: Patient awake and tracks the examiner around the room and looks toward the voice when her name is called.  She does not follow commands.  She yells out but provides no speech.   Cranial Nerves: II: Blinks to bilateral confrontation, pupils equal, round, reactive to light and accommodation III,IV, VI: ptosis not present, extra-ocular motions intact bilaterally V,VII: face symmetric VIII: hearing normal bilaterally IX,X: gag reflex unable to be tested XI: unable to test XII: unable to test Motor: Patient keeps arms close to her and feet crossed.  Actively and strongly fights active movement of her extremities Sensory: Responds to noxious stimuli throughout   Lab Results: Basic Metabolic Panel: Recent Labs  Lab 05/11/19 1055 05/13/19 0650  NA 137 139  K 3.5 3.8  CL 99 98  CO2 25 23  GLUCOSE 109* 117*  BUN 27* 33*  CREATININE 7.49* 7.39*  CALCIUM 8.6* 8.9    Liver Function Tests: Recent Labs  Lab 05/11/19 1055  AST 13*  ALT 8  ALKPHOS 89  BILITOT 0.8  PROT 8.8*  ALBUMIN 3.5   No results for input(s):  LIPASE, AMYLASE in the last 168 hours. No results for input(s): AMMONIA in the last 168 hours.  CBC: Recent Labs  Lab 05/11/19 1055 05/13/19 0650  WBC 13.6* 13.2*  NEUTROABS 12.0*  --   HGB 11.3* 10.9*  HCT 36.3 35.3*  MCV 98.9 98.9  PLT 199 180    Cardiac Enzymes: No results for input(s): CKTOTAL, CKMB, CKMBINDEX, TROPONINI in the last 168 hours.  Lipid Panel: No results for input(s): CHOL, TRIG, HDL, CHOLHDL, VLDL, LDLCALC in the last 168 hours.  CBG: Recent Labs  Lab 05/11/19 2122 05/12/19 0215 05/12/19 0810 05/12/19 1954  GLUCAP 102* 90 92 115*    Microbiology: Results for orders placed or performed during the hospital encounter of 05/11/19  Blood Culture (routine x 2)     Status: Abnormal   Collection Time: 05/11/19 11:21 AM   Specimen: BLOOD  Result Value Ref Range Status   Specimen Description BLOOD RIGHT ANTECUBITAL  Final   Special Requests   Final    BOTTLES DRAWN AEROBIC AND ANAEROBIC Blood Culture adequate volume   Culture  Setup Time   Final    GRAM NEGATIVE RODS ANAEROBIC BOTTLE ONLY CRITICAL RESULT CALLED TO, READ BACK BY AND VERIFIED WITH: JASON ROBBINS AT [place] 05/12/2019 KMP    Culture BACTEROIDES FRAGILIS  BETA LACTAMASE POSITIVE  (A)  Final   Report Status 05/14/2019 FINAL  Final  Urine culture     Status: Abnormal   Collection Time: 05/11/19 11:21 AM   Specimen: Urine, Random  Result Value Ref Range Status   Specimen Description   Final    URINE, RANDOM Performed at [facility], [address] [place], [place] [address]    Special Requests   Final    NONE Performed at [facility], [address] [place], [place] [address]    Culture >=100,000 COLONIES/mL ESCHERICHIA COLI (A)  Final   Report Status 05/13/2019 FINAL  Final   Organism ID, Bacteria ESCHERICHIA COLI (A)  Final      Susceptibility   Escherichia coli - MIC*    AMPICILLIN <=2 SENSITIVE Sensitive     CEFAZOLIN <=4 SENSITIVE Sensitive      CEFTRIAXONE <=1 SENSITIVE Sensitive     CIPROFLOXACIN <=0.25 SENSITIVE Sensitive     GENTAMICIN <=1 SENSITIVE Sensitive     IMIPENEM <=0.25 SENSITIVE Sensitive     NITROFURANTOIN <=16 SENSITIVE Sensitive     TRIMETH/SULFA <=20 SENSITIVE Sensitive     AMPICILLIN/SULBACTAM <=2 SENSITIVE Sensitive     PIP/TAZO <=4 SENSITIVE Sensitive     Extended ESBL NEGATIVE Sensitive     * >=100,000 COLONIES/mL ESCHERICHIA COLI  Blood Culture ID Panel (Reflexed)     Status: None   Collection Time: 05/11/19 11:21 AM  Result Value Ref Range Status   Enterococcus species NOT DETECTED NOT DETECTED Final   Listeria monocytogenes NOT DETECTED NOT DETECTED Final   Staphylococcus species NOT DETECTED NOT DETECTED Final   Staphylococcus aureus (BCID) NOT DETECTED NOT DETECTED Final   Streptococcus species NOT DETECTED NOT DETECTED Final   Streptococcus agalactiae NOT DETECTED NOT DETECTED Final   Streptococcus pneumoniae NOT DETECTED NOT DETECTED Final   Streptococcus pyogenes NOT DETECTED NOT DETECTED Final   Acinetobacter baumannii NOT DETECTED NOT DETECTED Final   Enterobacteriaceae species NOT DETECTED NOT DETECTED Final   Enterobacter cloacae complex NOT DETECTED NOT DETECTED Final   Escherichia coli NOT DETECTED NOT DETECTED Final   Klebsiella oxytoca NOT DETECTED NOT DETECTED Final   Klebsiella pneumoniae NOT DETECTED NOT DETECTED Final   Proteus species NOT DETECTED NOT DETECTED Final   Serratia marcescens NOT DETECTED NOT DETECTED Final   Haemophilus influenzae NOT DETECTED NOT DETECTED Final   Neisseria meningitidis NOT DETECTED NOT DETECTED Final   Pseudomonas aeruginosa NOT DETECTED NOT DETECTED Final   Candida albicans NOT DETECTED NOT DETECTED Final   Candida glabrata NOT DETECTED NOT DETECTED Final   Candida krusei NOT DETECTED NOT DETECTED Final   Candida parapsilosis NOT DETECTED NOT DETECTED Final   Candida tropicalis NOT DETECTED NOT DETECTED Final    Comment: Performed at [facility], [address] [place], [place] [address]  SARS Coronavirus 2 by RT PCR (hospital order, performed in [place] hospital lab) Nasopharyngeal Nasopharyngeal Swab     Status: None   Collection Time: 05/11/19 12:01 PM   Specimen: Nasopharyngeal Swab  Result Value Ref Range Status   SARS Coronavirus 2 NEGATIVE NEGATIVE Final    Comment: (NOTE) If result is NEGATIVE SARS-CoV-2 target nucleic acids are NOT DETECTED. The SARS-CoV-2 RNA is generally detectable in upper and lower  respiratory specimens during the acute phase of infection. The lowest  concentration of SARS-CoV-2 viral copies this assay can detect is 250  copies / mL. A negative result does not preclude SARS-CoV-2 infection  and should not be used as the sole basis for treatment or other  patient management decisions.  A negative result may occur with  improper specimen collection / handling, submission of specimen other  than nasopharyngeal swab, presence of viral mutation(s) within the  areas targeted by this assay, and inadequate number of viral copies  (<250 copies / mL). A negative result must be combined with clinical  observations, patient history, and epidemiological information. If result is POSITIVE SARS-CoV-2 target nucleic acids are DETECTED. The SARS-CoV-2 RNA is generally detectable in upper and lower  respiratory specimens dur ing the acute phase of infection.  Positive  results are indicative of active infection with SARS-CoV-2.  Clinical  correlation with patient history and other diagnostic information is  necessary to determine patient infection status.  Positive results do  not rule out bacterial infection or co-infection with other viruses. If result is PRESUMPTIVE POSTIVE SARS-CoV-2 nucleic acids MAY BE PRESENT.   A presumptive positive result was obtained on the submitted specimen  and confirmed on repeat testing.  While 2019 novel coronavirus  (SARS-CoV-2) nucleic acids may be present  in the submitted sample  additional confirmatory testing may be necessary for epidemiological  and / or clinical management purposes  to differentiate between  SARS-CoV-2 and other Sarbecovirus currently known to infect humans.  If clinically indicated additional testing with an alternate test  methodology [phone]) is advised. The SARS-CoV-2 RNA is generally  detectable in upper and lower respiratory sp ecimens during the acute  phase of infection. The expected result is Negative. Fact Sheet for Patients:  StrictlyIdeas.no Fact Sheet for Healthcare Providers: BankingDealers.co.za This test is not yet approved or cleared by the [place] FDA and has been authorized for detection and/or diagnosis of SARS-CoV-2 by FDA under an Emergency Use Authorization (EUA).  This EUA will remain in effect (meaning this test can be used) for the duration of the COVID-19 declaration under Section 564(b)(1) of the Act, 21 U.S.C. section 360bbb-3(b)(1), unless the authorization is terminated or revoked sooner. Performed at [facility], [address] [place], [place] [address]   Blood Culture (routine x 2)     Status: None (Preliminary result)   Collection Time: 05/11/19  9:59 PM   Specimen: BLOOD  Result Value Ref Range Status   Specimen Description BLOOD BLOOD RIGHT HAND  Final   Special Requests   Final    BOTTLES DRAWN AEROBIC AND ANAEROBIC Blood Culture adequate volume   Culture   Final    NO GROWTH 4 DAYS Performed at [facility], [address] [place], [place] [address]    Report Status PENDING  Incomplete  MRSA PCR Screening     Status: Abnormal   Collection Time: 05/12/19  7:01 PM   Specimen: Nasal Mucosa; Nasopharyngeal  Result Value Ref Range Status   MRSA by PCR POSITIVE (A) NEGATIVE Final    Comment:        The GeneXpert MRSA Assay (FDA approved for NASAL specimens only), is one component of  a comprehensive MRSA colonization surveillance program. It is not intended to diagnose MRSA infection nor to guide or monitor treatment for MRSA infections. CRITICAL RESULT CALLED TO, READ BACK BY AND VERIFIED WITH: Jacqulynn Cadet AT 2103 05/12/2019 KMP Performed at [facility], [address] [place], [place] [address]     Coagulation Studies: No results for input(s): LABPROT, INR in the last 72 hours.  Imaging: Mr Brain Wo Contrast  Result Date: 05/14/2019 CLINICAL  DATA:  Altered mental status (AMS), unclear cause. EXAM: MRI HEAD WITHOUT CONTRAST TECHNIQUE: Multiplanar, multiecho pulse sequences of the brain and surrounding structures were obtained without intravenous contrast. COMPARISON:  Head CT examinations 05/12/2019 and earlier FINDINGS: Brain: Multiple sequences are significantly motion degraded, limiting evaluation. A fast brain protocol was utilized. There is no convincing evidence of acute infarct on mildly motion degraded diffusion weighted imaging. No evidence of intracranial mass. No midline shift or extra-axial fluid collection. No chronic intracranial blood products. 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 There is otherwise mild scattered and confluent T2/FLAIR hyperintensity within the cerebral white matter consistent with chronic small vessel ischemic disease. Small T2 hyperintense foci within the bilateral basal ganglia likely reflect a combination of prominent perivascular spaces and chronic lacunar infarcts. Mild generalized parenchymal atrophy. Vascular: Flow voids maintained within the proximal large arterial vessels. Skull and upper cervical spine: No focal marrow lesion identified on motion degraded imaging. Incompletely assessed cervical spondylosis. Sinuses/Orbits: Bilateral lens replacements. No significant paranasal sinus disease or mastoid effusion. IMPRESSION: 1.  Motion degraded examination. 2. No evidence of acute intracranial abnormality. 3. Generalized parenchymal atrophy with chronic small vessel ischemic disease. 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 Electronically Signed   By: Kellie Simmering DO   On: 05/14/2019 17:23    Medications:  I have reviewed the patient's current medications. Scheduled: . chlorhexidine  15 mL Mouth/Throat Q4H  . Chlorhexidine Gluconate Cloth  6 each Topical Daily  . cloNIDine  0.2 mg Transdermal Weekly  . epoetin (EPOGEN/PROCRIT) injection  2,000 Units Intravenous Once  . heparin  5,000 Units Subcutaneous Q8H  . influenza vaccine adjuvanted  0.5 mL Intramuscular Tomorrow-1000  . lidocaine (PF)  5 mL Intradermal Once  . mupirocin ointment  1 application Nasal BID  . pantoprazole (PROTONIX) IV  40 mg Intravenous Q12H    Assessment/Plan: 78 year old female presenting with confusion, SOB, hypoxia and abdominal pain.  Felt to have PNA. Started on Cefepime.  White blood cell count not significantly improved.  Has been afebrile since 10/23 with only low grade temps noted since that time.  Head CT reviewed and shows no acute changes.  MRI of the brain reviewed and shows no evidence of acute infarct.  Changed to Unasyn today from Cefepime.  EEG pending.    Recommendations: 1.  EEG pending   LOS: 4 days   Alexis Goodell, MD Neurology [phone] 05/15/2019  3:10 PM

## 2019-05-15 NOTE — Progress Notes (Signed)
Barbara [name], is a 78 y.o. female, DOB - 1941/04/27, EML:[id number]  Admit date - 05/11/2019   Admitting Physician Dustin Flock, MD  Outpatient Primary MD for the patient is Barbara Lemon, MD   LOS - 4  Subjective: Pt not able to communicate, seen by neuro yest    Review of Systems:   CONSTITUTIONAL:unable to provide  Vitals:   Vitals:   05/15/19 1145 05/15/19 1200 05/15/19 1215 05/15/19 1220  BP: 115/88 110/60 (!) 110/52 (!) 109/59  Pulse: 95 96 98 95  Resp: 18 16 17  (!) 22  Temp:    98.4 F (36.9 C)  TempSrc:    Oral  SpO2:      Weight:   76.1 kg 76.1 kg  Height:        Wt Readings from Last 3 Encounters:  05/15/19 76.1 kg  02/28/19 81.6 kg  02/07/19 81.6 kg     Intake/Output Summary (Last 24 hours) at 05/15/2019 1336 Last data filed at 05/15/2019 1220 Gross per 24 hour  Intake 320.99 ml  Output 337 ml  Net -16.01 ml    Physical Exam:   GENERAL: obsese chronically ill appearing  HEAD, EYES, EARS, NOSE AND THROAT: Atraumatic, normocephalic. Extraocular muscles are intact. Pupils equal and reactive to light. Sclerae anicteric. No conjunctival injection. No oro-pharyngeal erythema.  NECK: Supple. There is no jugular venous distention. No bruits, no lymphadenopathy, no thyromegaly.  HEART: Regular rate and rhythm,. No murmurs, no rubs, no clicks.  LUNGS: Clear to auscultation bilaterally. No rales or rhonchi. No wheezes.  ABDOMEN: Soft, flat, nontender, nondistended. Has good bowel sounds. No hepatosplenomegaly appreciated.  EXTREMITIES: No evidence of any cyanosis, clubbing, or peripheral edema.  +2 pedal and radial pulses bilaterally.  NEUROLOGIC: Patient awake but not communicating not following  commands SKIN: Moist and warm with no rashes appreciated.  Psych: Not anxious, depressed LN: No inguinal LN enlargement    Antibiotics   Anti-infectives (From admission, onward)   Start     Dose/Rate Route Frequency Ordered Stop   05/15/19 1200  vancomycin (VANCOCIN) IVPB 1000 mg/200 mL premix  Status:  Discontinued     1,000 mg 200 mL/hr over 60 Minutes Intravenous Every M-W-F (Hemodialysis) 05/12/19 1420 05/12/19 1917   05/14/19 2330  metroNIDAZOLE (FLAGYL) IVPB 500 mg     500 mg 100 mL/hr over 60 Minutes Intravenous Every 8 hours 05/14/19 2318  05/12/19 1800  ceFEPIme (MAXIPIME) 1 g in sodium chloride 0.9 % 100 mL IVPB     1 g 200 mL/hr over 30 Minutes Intravenous Every 24 hours 05/12/19 1420     05/12/19 1600  vancomycin (VANCOCIN) IVPB 1000 mg/200 mL premix     1,000 mg 200 mL/hr over 60 Minutes Intravenous  Once 05/12/19 1420 05/12/19 1656   05/11/19 1530  cefTRIAXone (ROCEPHIN) 2 g in sodium chloride 0.9 % 100 mL IVPB  Status:  Discontinued     2 g 200 mL/hr over 30 Minutes Intravenous Every 24 hours 05/11/19 1520 05/12/19 0759   05/11/19 1530  azithromycin (ZITHROMAX) 500 mg in sodium chloride 0.9 % 250 mL IVPB  Status:  Discontinued     500 mg 250 mL/hr over 60 Minutes Intravenous Every 24 hours 05/11/19 1520 05/12/19 0759   05/11/19 1300  vancomycin (VANCOCIN) 500 mg in sodium chloride 0.9 % 100 mL IVPB     500 mg 100 mL/hr over 60 Minutes Intravenous  Once 05/11/19 1116 05/11/19 1539   05/11/19 1100  vancomycin (VANCOCIN) IVPB 1000 mg/200 mL premix     1,000 mg 200 mL/hr over 60 Minutes Intravenous  Once 05/11/19 1054 05/11/19 1534   05/11/19 1100  ceFEPIme (MAXIPIME) 2 g in sodium chloride 0.9 % 100 mL IVPB     2 g 200 mL/hr over 30 Minutes Intravenous  Once 05/11/19 1054 05/11/19 1219      Medications   Scheduled Meds: . chlorhexidine  15 mL Mouth/Throat Q4H  . Chlorhexidine Gluconate Cloth  6 each Topical Daily  . cloNIDine  0.2 mg Transdermal Weekly  .  epoetin (EPOGEN/PROCRIT) injection  2,000 Units Intravenous Once  . heparin  5,000 Units Subcutaneous Q8H  . influenza vaccine adjuvanted  0.5 mL Intramuscular Tomorrow-1000  . lidocaine (PF)  5 mL Intradermal Once  . mupirocin ointment  1 application Nasal BID  . pantoprazole (PROTONIX) IV  40 mg Intravenous Q12H   Continuous Infusions: . sodium chloride 250 mL (05/15/19 0620)  . ceFEPime (MAXIPIME) IV Stopped (05/14/19 1839)  . metronidazole 500 mg (05/15/19 0623)   PRN Meds:.sodium chloride, acetaminophen **OR** acetaminophen, albuterol, iohexol   Data Review:   Micro Results Recent Results (from the past 240 hour(s))  Blood Culture (routine x 2)     Status: Abnormal   Collection Time: 05/11/19 11:21 AM   Specimen: BLOOD  Result Value Ref Range Status   Specimen Description BLOOD RIGHT ANTECUBITAL  Final   Special Requests   Final    BOTTLES DRAWN AEROBIC AND ANAEROBIC Blood Culture adequate volume   Culture  Setup Time   Final    GRAM NEGATIVE RODS ANAEROBIC BOTTLE ONLY CRITICAL RESULT CALLED TO, READ BACK BY AND VERIFIED WITH: JASON ROBBINS AT 2637 05/12/2019 KMP    Culture BACTEROIDES FRAGILIS BETA LACTAMASE POSITIVE  (A)  Final   Report Status 05/14/2019 FINAL  Final  Urine culture     Status: Abnormal   Collection Time: 05/11/19 11:21 AM   Specimen: Urine, Random  Result Value Ref Range Status   Specimen Description   Final    URINE, RANDOM Performed at [facility], [address], [place], [place] [address]    Special Requests   Final    NONE Performed at [facility], [address] [place], [place] [address]    Culture >=100,000 COLONIES/mL ESCHERICHIA COLI (A)  Final   Report Status 05/13/2019 FINAL  Final   Organism ID, Bacteria ESCHERICHIA COLI (  A)  Final      Susceptibility   Escherichia coli - MIC*    AMPICILLIN <=2 SENSITIVE Sensitive     CEFAZOLIN <=4 SENSITIVE Sensitive     CEFTRIAXONE <=1 SENSITIVE Sensitive      CIPROFLOXACIN <=0.25 SENSITIVE Sensitive     GENTAMICIN <=1 SENSITIVE Sensitive     IMIPENEM <=0.25 SENSITIVE Sensitive     NITROFURANTOIN <=16 SENSITIVE Sensitive     TRIMETH/SULFA <=20 SENSITIVE Sensitive     AMPICILLIN/SULBACTAM <=2 SENSITIVE Sensitive     PIP/TAZO <=4 SENSITIVE Sensitive     Extended ESBL NEGATIVE Sensitive     * >=100,000 COLONIES/mL ESCHERICHIA COLI  Blood Culture ID Panel (Reflexed)     Status: None   Collection Time: 05/11/19 11:21 AM  Result Value Ref Range Status   Enterococcus species NOT DETECTED NOT DETECTED Final   Listeria monocytogenes NOT DETECTED NOT DETECTED Final   Staphylococcus species NOT DETECTED NOT DETECTED Final   Staphylococcus aureus (BCID) NOT DETECTED NOT DETECTED Final   Streptococcus species NOT DETECTED NOT DETECTED Final   Streptococcus agalactiae NOT DETECTED NOT DETECTED Final   Streptococcus pneumoniae NOT DETECTED NOT DETECTED Final   Streptococcus pyogenes NOT DETECTED NOT DETECTED Final   Acinetobacter baumannii NOT DETECTED NOT DETECTED Final   Enterobacteriaceae species NOT DETECTED NOT DETECTED Final   Enterobacter cloacae complex NOT DETECTED NOT DETECTED Final   Escherichia coli NOT DETECTED NOT DETECTED Final   Klebsiella oxytoca NOT DETECTED NOT DETECTED Final   Klebsiella pneumoniae NOT DETECTED NOT DETECTED Final   Proteus species NOT DETECTED NOT DETECTED Final   Serratia marcescens NOT DETECTED NOT DETECTED Final   Haemophilus influenzae NOT DETECTED NOT DETECTED Final   Neisseria meningitidis NOT DETECTED NOT DETECTED Final   Pseudomonas aeruginosa NOT DETECTED NOT DETECTED Final   Candida albicans NOT DETECTED NOT DETECTED Final   Candida glabrata NOT DETECTED NOT DETECTED Final   Candida krusei NOT DETECTED NOT DETECTED Final   Candida parapsilosis NOT DETECTED NOT DETECTED Final   Candida tropicalis NOT DETECTED NOT DETECTED Final    Comment: Performed at [facility], [address]  [place], [place] [address]  SARS Coronavirus 2 by RT PCR (hospital order, performed in [place] hospital lab) Nasopharyngeal Nasopharyngeal Swab     Status: None   Collection Time: 05/11/19 12:01 PM   Specimen: Nasopharyngeal Swab  Result Value Ref Range Status   SARS Coronavirus 2 NEGATIVE NEGATIVE Final    Comment: (NOTE) If result is NEGATIVE SARS-CoV-2 target nucleic acids are NOT DETECTED. The SARS-CoV-2 RNA is generally detectable in upper and lower  respiratory specimens during the acute phase of infection. The lowest  concentration of SARS-CoV-2 viral copies this assay can detect is 250  copies / mL. A negative result does not preclude SARS-CoV-2 infection  and should not be used as the sole basis for treatment or other  patient management decisions.  A negative result may occur with  improper specimen collection / handling, submission of specimen other  than nasopharyngeal swab, presence of viral mutation(s) within the  areas targeted by this assay, and inadequate number of viral copies  (<250 copies / mL). A negative result must be combined with clinical  observations, patient history, and epidemiological information. If result is POSITIVE SARS-CoV-2 target nucleic acids are DETECTED. The SARS-CoV-2 RNA is generally detectable in upper and lower  respiratory specimens dur ing the acute phase of infection.  Positive  results are indicative of active infection with  SARS-CoV-2.  Clinical  correlation with patient history and other diagnostic information is  necessary to determine patient infection status.  Positive results do  not rule out bacterial infection or co-infection with other viruses. If result is PRESUMPTIVE POSTIVE SARS-CoV-2 nucleic acids MAY BE PRESENT.   A presumptive positive result was obtained on the submitted specimen  and confirmed on repeat testing.  While 2019 novel coronavirus  (SARS-CoV-2) nucleic acids may be present in the submitted sample  additional  confirmatory testing may be necessary for epidemiological  and / or clinical management purposes  to differentiate between  SARS-CoV-2 and other Sarbecovirus currently known to infect humans.  If clinically indicated additional testing with an alternate test  methodology [phone]) is advised. The SARS-CoV-2 RNA is generally  detectable in upper and lower respiratory sp ecimens during the acute  phase of infection. The expected result is Negative. Fact Sheet for Patients:  StrictlyIdeas.no Fact Sheet for Healthcare Providers: BankingDealers.co.za This test is not yet approved or cleared by the [place] FDA and has been authorized for detection and/or diagnosis of SARS-CoV-2 by FDA under an Emergency Use Authorization (EUA).  This EUA will remain in effect (meaning this test can be used) for the duration of the COVID-19 declaration under Section 564(b)(1) of the Act, 21 U.S.C. section 360bbb-3(b)(1), unless the authorization is terminated or revoked sooner. Performed at [facility], [address] [place], [place] [address]   Blood Culture (routine x 2)     Status: None (Preliminary result)   Collection Time: 05/11/19  9:59 PM   Specimen: BLOOD  Result Value Ref Range Status   Specimen Description BLOOD BLOOD RIGHT HAND  Final   Special Requests   Final    BOTTLES DRAWN AEROBIC AND ANAEROBIC Blood Culture adequate volume   Culture   Final    NO GROWTH 4 DAYS Performed at [facility], [address], [place], [place] [address]    Report Status PENDING  Incomplete  MRSA PCR Screening     Status: Abnormal   Collection Time: 05/12/19  7:01 PM   Specimen: Nasal Mucosa; Nasopharyngeal  Result Value Ref Range Status   MRSA by PCR POSITIVE (A) NEGATIVE Final    Comment:        The GeneXpert MRSA Assay (FDA approved for NASAL specimens only), is one component of a comprehensive MRSA colonization surveillance  program. It is not intended to diagnose MRSA infection nor to guide or monitor treatment for MRSA infections. CRITICAL RESULT CALLED TO, READ BACK BY AND VERIFIED WITH: Jacqulynn Cadet AT 2103 05/12/2019 KMP Performed at [facility], [address] [place], [place] [address]     Radiology Reports Ct Abdomen Pelvis Wo Contrast  Result Date: 05/11/2019 CLINICAL DATA:  Abdominal pain, fever.  History of bladder cancer EXAM: CT ABDOMEN AND PELVIS WITHOUT CONTRAST TECHNIQUE: Multidetector CT imaging of the abdomen and pelvis was performed following the standard protocol without IV contrast. COMPARISON:  01/19/2018 FINDINGS: Lower chest: Small bilateral pleural effusions. Cardiomegaly. Bibasilar airspace opacities likely atelectasis. Hepatobiliary: No focal hepatic abnormality. Gallbladder unremarkable. Pancreas: No focal abnormality or ductal dilatation. Spleen: No focal abnormality.  Normal size. Adrenals/Urinary Tract: Severe bilateral hydroureteronephrosis, unchanged. Bladder wall is markedly thickened and stable since prior study. No visible adrenal lesion. Stomach/Bowel: Stomach, large and small bowel grossly unremarkable. Vascular/Lymphatic: Heavily calcified aorta and iliac vessels. Right common iliac lymph node again noted with a short axis diameter of 1.5 cm, stable. Right inguinal lymph node has  a short axis diameter of 7 mm, stable. Reproductive: Prior hysterectomy.  No adnexal masses. Other: No free fluid or free air. Musculoskeletal: No acute bony abnormality. IMPRESSION: Continued marked bladder wall thickening and severe bilateral hydronephrosis. Findings similar to prior study. Heavily calcified aorta and iliac vessels. New small bilateral pleural effusions. Bibasilar opacities, likely atelectasis. Stable mildly enlarged and borderline sized right common iliac and right inguinal lymph nodes. Electronically Signed   By: Rolm Baptise M.D.   On: 05/11/2019 13:51   Ct Head Wo  Contrast  Result Date: 05/12/2019 CLINICAL DATA:  Altered mental status. EXAM: CT HEAD WITHOUT CONTRAST TECHNIQUE: Contiguous axial images were obtained from the base of the skull through the vertex without intravenous contrast. COMPARISON:  Head CT 05/11/2017 FINDINGS: Brain: Stable age related cerebral atrophy, ventriculomegaly and periventricular white matter disease. No extra-axial fluid collections are identified. No CT findings for acute hemispheric infarction or intracranial hemorrhage. No mass lesions. The brainstem and cerebellum are normal. Vascular: Stable vascular calcifications. No aneurysm or hyperdense vessels. Skull: No skull fracture or bone lesions. Sinuses/Orbits: The paranasal sinuses and mastoid air cells are clear. The globes are intact. Other: No scalp lesions or hematoma. IMPRESSION: 1. Stable age related cerebral atrophy, ventriculomegaly and periventricular white matter disease. 2. No acute intracranial findings or mass lesions. Electronically Signed   By: Marijo Sanes M.D.   On: 05/12/2019 08:51   Mr Brain Wo Contrast  Result Date: 05/14/2019 CLINICAL DATA:  Altered mental status (AMS), unclear cause. EXAM: MRI HEAD WITHOUT CONTRAST TECHNIQUE: Multiplanar, multiecho pulse sequences of the brain and surrounding structures were obtained without intravenous contrast. COMPARISON:  Head CT examinations 05/12/2019 and earlier FINDINGS: Brain: Multiple sequences are significantly motion degraded, limiting evaluation. A fast brain protocol was utilized. There is no convincing evidence of acute infarct on mildly motion degraded diffusion weighted imaging. No evidence of intracranial mass. No midline shift or extra-axial fluid collection. No chronic intracranial blood products. 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 There is otherwise mild scattered and confluent T2/FLAIR hyperintensity within the  cerebral white matter consistent with chronic small vessel ischemic disease. Small T2 hyperintense foci within the bilateral basal ganglia likely reflect a combination of prominent perivascular spaces and chronic lacunar infarcts. Mild generalized parenchymal atrophy. Vascular: Flow voids maintained within the proximal large arterial vessels. Skull and upper cervical spine: No focal marrow lesion identified on motion degraded imaging. Incompletely assessed cervical spondylosis. Sinuses/Orbits: Bilateral lens replacements. No significant paranasal sinus disease or mastoid effusion. IMPRESSION: 1. Motion degraded examination. 2. No evidence of acute intracranial abnormality. 3. Generalized parenchymal atrophy with chronic small vessel ischemic disease. 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 Electronically Signed   By: Kellie Simmering DO   On: 05/14/2019 17:23   Dg Chest Port 1 View  Result Date: 05/11/2019 CLINICAL DATA:  Sepsis EXAM: PORTABLE CHEST 1 VIEW COMPARISON:  05/10/2018 FINDINGS: Cardiac shadow is mildly prominent but accentuated by the portable technique. Further stenting in the left innominate vein and left subclavian vein is noted. Right chest wall port is been removed in the interval. Diffuse vascular congestion is noted with mild interstitial edema. This may be related to a degree of volume overload related to the end-stage renal failure. No focal confluent infiltrate is seen. No sizable effusion is noted. No bony abnormality is noted. IMPRESSION: Vascular congestion likely related  to the known renal failure and volume overload. No other focal abnormality is seen. Electronically Signed   By: Inez Catalina M.D.   On: 05/11/2019 12:11     CBC Recent Labs  Lab 05/11/19 1055 05/13/19 0650  WBC 13.6* 13.2*  HGB 11.3* 10.9*  HCT 36.3 35.3*  PLT 199 180  MCV 98.9 98.9  MCH 30.8 30.5  MCHC 31.1 30.9  RDW 13.4 13.4  LYMPHSABS  0.8  --   MONOABS 0.7  --   EOSABS 0.0  --   BASOSABS 0.0  --     Chemistries  Recent Labs  Lab 05/11/19 1055 05/13/19 0650  NA 137 139  K 3.5 3.8  CL 99 98  CO2 25 23  GLUCOSE 109* 117*  BUN 27* 33*  CREATININE 7.49* 7.39*  CALCIUM 8.6* 8.9  AST 13*  --   ALT 8  --   ALKPHOS 89  --   BILITOT 0.8  --    ------------------------------------------------------------------------------------------------------------------ estimated creatinine clearance is 6.2 mL/min (A) (by C-G formula based on SCr of 7.39 mg/dL (H)). ------------------------------------------------------------------------------------------------------------------ No results for input(s): HGBA1C in the last 72 hours. ------------------------------------------------------------------------------------------------------------------ No results for input(s): CHOL, HDL, LDLCALC, TRIG, CHOLHDL, LDLDIRECT in the last 72 hours. ------------------------------------------------------------------------------------------------------------------ No results for input(s): TSH, T4TOTAL, T3FREE, THYROIDAB in the last 72 hours.  Invalid input(s): FREET3 ------------------------------------------------------------------------------------------------------------------ No results for input(s): VITAMINB12, FOLATE, FERRITIN, TIBC, IRON, RETICCTPCT in the last 72 hours.  Coagulation profile No results for input(s): INR, PROTIME in the last 168 hours.  No results for input(s): DDIMER in the last 72 hours.  Cardiac Enzymes No results for input(s): CKMB, TROPONINI, MYOGLOBIN in the last 168 hours.  Invalid input(s): CK ------------------------------------------------------------------------------------------------------------------ Invalid input(s): POCBNP    Assessment & Plan   IMPRESSION AND PLAN: Patient 78 year old presenting with abdominal pain confusion and hypoxia  1.   pneumonia / uti  Positive blood cultures for   BACTEROIDES FRAGILIS - change antibiotics to unsyn,   2.  Acute encephalopathy  No significant improvement Mri shows no acute abnormality  eeg pending  3.  End-stage renal disease nephrology has been consulted for hemodialysis continue PhosLo and Sensipar  4.  Diabetes type 2 continue sliding-scale insulin   5.  Sleep apnea CPAP at bedtime  6.  GERD Protonix IV  7 hypertension hold bp meds on clondine tts   Failure to improve I will ask pallative care to see prognosis poor       Code Status Orders  (From admission, onward)         Start     Ordered   05/11/19 2137  Full code  Continuous     05/11/19 2136        Code Status History    Date Active Date Inactive Code Status Order ID Comments User Context   07/27/2018 1819 07/30/2018 1640 Full Code [id number]  Sela Hua, MD Inpatient   05/10/2018 1831 05/13/2018 2249 Full Code [id number]  Hillary Bow, MD ED   01/24/2017 2348 01/29/2017 1614 Full Code [id number]  Forest [name], [place], DO Inpatient   11/19/2016 1528 11/23/2016 2156 Full Code [id number]  Loletha Grayer, MD Inpatient   06/05/2016 0108 06/08/2016 1345 Full Code [id number]  Lance Coon, MD Inpatient   05/12/2016 1432 05/15/2016 1746 Full Code [id number]  Lytle Butte, MD Inpatient   05/04/2016 1455 05/09/2016 1828 Full Code [id number]  Epifanio Lesches, MD ED   04/17/2016 1336 04/20/2016 2041 Full Code [id number]  Demetrios Loll, MD Inpatient  03/04/2016 1358 03/10/2016 1440 Full Code [id number]  Hillary Bow, MD ED   01/21/2016 2115 01/27/2016 1801 Full Code [id number]  Edwin Dada, MD Inpatient   01/20/2016 1841 01/21/2016 2115 Full Code [id number]  Theodoro Grist, MD Inpatient   12/18/2015 0214 12/23/2015 1651 Full Code [id number]  Saundra Shelling, MD Inpatient   Advance Care Planning Activity           Consults  none   DVT Prophylaxis  Lovenox   Lab Results  Component Value Date   PLT 180 05/13/2019     Time Spent in minutes  1mn   Greater than 50% of time spent in care coordination and counseling patient regarding the condition and plan of care.   SDustin FlockM.D on 05/15/2019 at 1:36 PM  Between 7am to 6pm - Pager - [phone]  After 6pm go to [email or web address] - [email or web address]   Office  [phone]

## 2019-05-15 NOTE — TOC Initial Note (Signed)
Transition of Care [facility]) - Initial/Assessment Note    Patient Details  Name: Barbara [name] MRN: [id number] Date of Birth: February 08, 1941  Transition of Care [facility]) CM/SW Contact:    [name], RN Phone Number: 05/15/2019, 3:53 PM  Clinical Narrative:                  RNCM assessment complete.  Full note to follow        Patient Goals and CMS Choice        Expected Discharge Plan and Services                                                Prior Living Arrangements/Services                       Activities of Daily Living Home Assistive Devices/Equipment: Other (Comment)(unknown) ADL Screening (condition at time of admission) Patient's cognitive ability adequate to safely complete daily activities?: No Is the patient deaf or have difficulty hearing?: No Does the patient have difficulty seeing, even when wearing glasses/contacts?: No Does the patient have difficulty concentrating, remembering, or making decisions?: Yes Patient able to express need for assistance with ADLs?: No Does the patient have difficulty dressing or bathing?: Yes Independently performs ADLs?: No Does the patient have difficulty walking or climbing stairs?: Yes Weakness of Legs: Both Weakness of Arms/Hands: None  Permission Sought/Granted                  Emotional Assessment              Admission diagnosis:  Sepsis with acute hypoxic respiratory failure, due to unspecified organism, unspecified whether septic shock present ([place]) [A41.9, R65.20, J96.01] Patient Active Problem List   Diagnosis Date Noted  . PNA (pneumonia) 05/11/2019  . Dialysis patient ([place])   . Metabolic acidosis 20/94/7096  . Complication from renal dialysis device 01/28/2018  . History of colonic polyps 07/30/2017  . Presence of cardiac and vascular implant and graft   . Fever   . Sepsis ([place])   . End stage renal disease ([place]) 02/01/2017  . Sepsis secondary to UTI ([place]) 01/24/2017   . Acute kidney injury ([place]) 11/19/2016  . Anemia, chronic renal failure, stage 4 (severe) ([place]) 09/17/2016  . Hypoglycemia 06/04/2016  . Hyperkalemia 05/12/2016  . Protein-calorie malnutrition, severe 05/05/2016  . Acute on chronic renal failure ([place]) 05/04/2016  . Seizure ([place]) 04/17/2016  . Palliative care by specialist   . DNR (do not resuscitate) discussion   . C. difficile diarrhea 03/11/2016  . Anemia 03/11/2016  . Hypotension 03/11/2016  . Acidosis 03/11/2016  . Failure to thrive (child) 03/11/2016  . Weakness generalized 03/11/2016  . ARF (acute renal failure) ([name]) 03/04/2016  . 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 Absolute anemia 02/04/2016  . Airway hyperreactivity 02/04/2016  . Celiac disease 02/04/2016  . Gastric catarrh 02/04/2016  . Acid reflux 02/04/2016  . Combined fat and carbohydrate induced hyperlipemia 02/04/2016  . C. difficile colitis 01/22/2016  . Urinary obstruction 01/21/2016  . COPD (chronic obstructive pulmonary disease) ([place]) 01/21/2016  . Controlled type 2 diabetes mellitus with stage 4 chronic kidney disease, with long-term current use of insulin ([place]) 01/21/2016  . Essential hypertension 01/21/2016  . Acute renal failure superimposed on stage 4 chronic kidney disease ([place])  01/20/2016  . Anemia in chronic kidney disease 01/20/2016  . Arthritis of knee, degenerative 05/10/2015  . Knee strain 03/26/2015  . Other intervertebral disc displacement, lumbar region 03/04/2015  . Degenerative arthritis of lumbar spine 03/04/2015  . HNP (herniated nucleus pulposus), lumbar 03/04/2015  . Osteoarthritis of spine with radiculopathy, lumbar region 03/04/2015  . Injury of tendon of upper extremity 11/12/2014  . Atherosclerosis of abdominal aorta ([place]) 11/02/2014  . Chronic kidney disease, stage IV (severe) ([place]) 11/02/2014  . Obstructive apnea 11/02/2014  . Complete rotator cuff rupture of left shoulder 10/05/2014  . Arthritis of shoulder  region, degenerative 10/05/2014  . CAD in native artery 12/08/2013  . Benign essential HTN 12/08/2013  . Type 2 diabetes mellitus ([place]) 12/08/2013   PCP:  [name], MD Pharmacy:   [id number] [place], [place] [id number]: [phone]: [phone]    Social Determinants of Health (SDOH) Interventions    Readmission Risk Interventions No flowsheet data found.

## 2019-05-15 NOTE — Progress Notes (Signed)
Post HD Assessment    05/15/19 1221  Neurological  Level of Consciousness Alert  Orientation Level Disoriented X4  Respiratory  Respiratory Pattern Regular  Chest Assessment Chest expansion symmetrical  Bilateral Breath Sounds Clear;Diminished  Cough None  Cardiac  Pulse Regular  ECG Monitor Yes  Cardiac Rhythm NSR  Vascular  R Radial Pulse +2  L Radial Pulse +2  Edema Generalized

## 2019-05-15 NOTE — Consult Note (Signed)
Pharmacy Antibiotic Note  Barbara [name] is a 78 y.o. female admitted on 05/11/2019 with pneumonia.  Pharmacy has been consulted for cefepime and vancomycin dosing. CT shows new small bilateral pleural effusions and bibasilar opacities that are likely atelectasis. She has ESRD on HD MWF.  Blood culture with bacteroides fragilis in 1 set.  Plan: Will switch cefepime and Flagyl to Unasyn 3 g IV q12h  Height: 5' 8"  (172.7 cm) Weight: 167 lb 12.3 oz (76.1 kg) IBW/kg (Calculated) : 63.9  Temp (24hrs), Avg:98.7 F (37.1 C), Min:98.4 F (36.9 C), Max:98.9 F (37.2 C)  Recent Labs  Lab 05/11/19 1055 05/13/19 0650  WBC 13.6* 13.2*  CREATININE 7.49* 7.39*  LATICACIDVEN 0.9  --     Estimated Creatinine Clearance: 6.2 mL/min (A) (by C-G formula based on SCr of 7.39 mg/dL (H)).    Antimicrobials this admission: cefepime 10/22 >> 10/25 vancomycin 10/22 >> 10/23 Metronidazole 10/25 >> 10/26 Unasyn 10/26 >>  Microbiology results: 10/22 BCx: 1/2 set bacteroides fragilis 10/22 UCx: E coli (pan-sensitive) 10/22 SARS CoV-2: negative  10/22 MRSA PCR: negative  Thank you for allowing pharmacy to be a part of this patient's care.  Tawnya Crook, PharmD 05/15/2019 1:44 PM

## 2019-05-15 NOTE — Progress Notes (Signed)
Post Hd Tx    05/15/19 1220  Vital Signs  Temp 98.4 F (36.9 C)  Pulse Rate 95  Pulse Rate Source Monitor  Resp (!) 22  BP (!) 109/59  BP Location Right Arm  BP Method Automatic  Patient Position (if appropriate) Lying  PAINAD (Pain Assessment in Advanced Dementia)  Breathing 0  Negative Vocalization 0  Facial Expression 0  Body Language 0  Consolability 0  PAINAD Score 0  Dialysis Weight  Weight 76.1 kg  Type of Weight Post-Dialysis  Post-Hemodialysis Assessment  Rinseback Volume (mL) 250 mL  KECN 39.8 V  Dialyzer Clearance Clotted  Duration of HD Treatment -hour(s) 2.5 hour(s) (Tx ended early due to clotted system, MD aware )  Hemodialysis Intake (mL) 500 mL  UF Total -Machine (mL) 837 mL  Net UF (mL) 337 mL  Tolerated HD Treatment Yes  Post-Hemodialysis Comments Tx shortened due to clotted system   AVG/AVF Arterial Site Held (minutes) 10 minutes  AVG/AVF Venous Site Held (minutes) 10 minutes  Fistula / Graft Left Upper arm Arteriovenous vein graft  Placement Date/Time: 02/17/17 1111   Orientation: Left  Access Location: Upper arm  Access Type: Arteriovenous vein graft  Site Condition No complications  Fistula / Graft Assessment Present;Thrill;Bruit  Status Deaccessed  Drainage Description None

## 2019-05-15 NOTE — Progress Notes (Signed)
Pre HD TX   05/15/19 0947  Vital Signs  Temp 98.6 F (37 C)  Temp Source Oral  Pulse Rate 85  Pulse Rate Source Monitor  Resp 16  BP (!) 139/58  BP Location Right Arm  BP Method Automatic  Patient Position (if appropriate) Lying  Oxygen Therapy  SpO2 98 %  O2 Device Nasal Cannula  O2 Flow Rate (L/min) 2 L/min  Pulse Oximetry Type Continuous  Pain Assessment  Pain Scale 0-10  Pain Score 0  PAINAD (Pain Assessment in Advanced Dementia)  Breathing 0  Negative Vocalization 0  Facial Expression 0  Body Language 0  Consolability 0  PAINAD Score 0  Dialysis Weight  Weight 80 kg  Type of Weight Pre-Dialysis  Time-Out for Hemodialysis  What Procedure? HD   Pt Identifiers(min of two) First/Last Name;MRN/Account#  Correct Site? Yes  Correct Side? Yes  Correct Procedure? Yes  Consents Verified? Yes  Rad Studies Available? Yes  Safety Precautions Reviewed? Yes  Engineer, civil (consulting) Number 7  Station Number 2  UF/Alarm Test Passed  Conductivity: Meter 13.6  Conductivity: Machine  13.6  pH 7.2  Reverse Osmosis Main  Normal Saline Lot Number [id number]  Dialyzer Lot Number [id number]  Disposable Set Lot Number [id number]  Machine Temperature 98.6 F (37 C)  Musician and Audible Yes  Blood Lines Intact and Secured Yes  Pre Treatment Patient Checks  Vascular access used during treatment Fistula  HD catheter dressing before treatment  (NA)  Hepatitis B Surface Antigen Results Negative  Date Hepatitis B Surface Antigen Drawn 05/12/19  Hepatitis B Surface Antibody 3  Date Hepatitis B Surface Antibody Drawn 05/12/19  Hemodialysis Consent Verified Yes  Hemodialysis Standing Orders Initiated Yes  ECG (Telemetry) Monitor On Yes  Prime Ordered Normal Saline  Length of  DialysisTreatment -hour(s) 3.5 Hour(s)  Dialysis Treatment Comments Na140  Dialyzer Elisio 17H NR  Dialysate 3K;2.5 Ca  Dialysis Anticoagulant None  Dialysate Flow Ordered 800  Blood Flow Rate Ordered  400 mL/min  Ultrafiltration Goal 0.5 Liters (verbal order change by MD )  Dialysis Blood Pressure Support Ordered Normal Saline  Education / Care Plan  Dialysis Education Provided No (Comment)  Documented Education in Care Plan  (Pt non interactive )  Fistula / Graft Left Upper arm Arteriovenous vein graft  Placement Date/Time: 02/17/17 1111   Orientation: Left  Access Location: Upper arm  Access Type: Arteriovenous vein graft  Site Condition No complications  Fistula / Graft Assessment Present;Thrill;Bruit  Status Patent

## 2019-05-16 ENCOUNTER — Other Ambulatory Visit: Admission: RE | Admit: 2019-05-16 | Payer: Medicare Other | Source: Ambulatory Visit

## 2019-05-16 DIAGNOSIS — R400 Somnolence: Secondary | ICD-10-CM

## 2019-05-16 DIAGNOSIS — Z515 Encounter for palliative care: Secondary | ICD-10-CM

## 2019-05-16 DIAGNOSIS — Z7189 Other specified counseling: Secondary | ICD-10-CM

## 2019-05-16 LAB — CULTURE, BLOOD (ROUTINE X 2)
Culture: NO GROWTH
Special Requests: ADEQUATE

## 2019-05-16 NOTE — Progress Notes (Signed)
Subjective: Patient not significantly changed today.    Objective: Current vital signs: BP (!) 121/48 (BP Location: Right Arm)   Pulse 76   Temp 98.6 F (37 C) (Oral)   Resp 18   Ht 5' 8"  (1.727 m)   Wt 76.1 kg   SpO2 99%   BMI 25.51 kg/m  Vital signs in last 24 hours: Temp:  [98.6 F (37 C)-98.7 F (37.1 C)] 98.6 F (37 C) (10/27 0525) Pulse Rate:  [76-88] 76 (10/27 0525) Resp:  [18] 18 (10/27 0525) BP: (108-131)/(40-56) 121/48 (10/27 0525) SpO2:  [98 %-99 %] 99 % (10/27 0525)  Intake/Output from previous day: 10/26 0701 - 10/27 0700 In: 120.7 [I.V.:20.7; IV Piggyback:100] Out: 337  Intake/Output this shift: No intake/output data recorded. Nutritional status:  Diet Order            Diet NPO time specified  Diet effective now              Neurologic Exam: Mental Status: Patient awake and tracks the examiner around the room and looks toward the voice when her name is called. She does not follow commands. She yells out but provides no speech.  Cranial Nerves: ZG:YFVCBS to bilateral confrontation, pupils equal, round, reactive to light and accommodation III,IV, VI: ptosis not present, extra-ocular motions intact bilaterally V,VII:face symmetric VIII: hearing normal bilaterally IX,X: gag reflexunable to be tested WH:QPRFFM to test BWG:YKZLDJ to test Motor: Patient keeps arms close to her and feet crossed. Actively and strongly fights active movement of her extremities.  Sensory:Responds to noxious stimuli throughout.  Avoids painful stimuli   Lab Results: Basic Metabolic Panel: Recent Labs  Lab 05/11/19 1055 05/13/19 0650 05/15/19 2123  NA 137 139 142  K 3.5 3.8 4.4  CL 99 98 100  CO2 25 23 24   GLUCOSE 109* 117* 99  BUN 27* 33* 58*  CREATININE 7.49* 7.39* 8.84*  CALCIUM 8.6* 8.9 9.3  MG  --   --  2.1    Liver Function Tests: Recent Labs  Lab 05/11/19 1055  AST 13*  ALT 8  ALKPHOS 89  BILITOT 0.8  PROT 8.8*  ALBUMIN 3.5   No  results for input(s): LIPASE, AMYLASE in the last 168 hours. No results for input(s): AMMONIA in the last 168 hours.  CBC: Recent Labs  Lab 05/11/19 1055 05/13/19 0650  WBC 13.6* 13.2*  NEUTROABS 12.0*  --   HGB 11.3* 10.9*  HCT 36.3 35.3*  MCV 98.9 98.9  PLT 199 180    Cardiac Enzymes: No results for input(s): CKTOTAL, CKMB, CKMBINDEX, TROPONINI in the last 168 hours.  Lipid Panel: No results for input(s): CHOL, TRIG, HDL, CHOLHDL, VLDL, LDLCALC in the last 168 hours.  CBG: Recent Labs  Lab 05/11/19 2122 05/12/19 0215 05/12/19 0810 05/12/19 1954 05/15/19 2022  GLUCAP 102* 90 92 115* 99    Microbiology: Results for orders placed or performed during the hospital encounter of 05/11/19  Blood Culture (routine x 2)     Status: Abnormal   Collection Time: 05/11/19 11:21 AM   Specimen: BLOOD  Result Value Ref Range Status   Specimen Description BLOOD RIGHT ANTECUBITAL  Final   Special Requests   Final    BOTTLES DRAWN AEROBIC AND ANAEROBIC Blood Culture adequate volume   Culture  Setup Time   Final    GRAM NEGATIVE RODS ANAEROBIC BOTTLE ONLY CRITICAL RESULT CALLED TO, READ BACK BY AND VERIFIED WITH: JASON ROBBINS AT 5701 05/12/2019 KMP    Culture  BACTEROIDES FRAGILIS BETA LACTAMASE POSITIVE  (A)  Final   Report Status 05/14/2019 FINAL  Final  Urine culture     Status: Abnormal   Collection Time: 05/11/19 11:21 AM   Specimen: Urine, Random  Result Value Ref Range Status   Specimen Description   Final    URINE, RANDOM Performed at [facility], [address] [place], [place] [address]    Special Requests   Final    NONE Performed at [facility], [address] [place], [place] [address]    Culture >=100,000 COLONIES/mL ESCHERICHIA COLI (A)  Final   Report Status 05/13/2019 FINAL  Final   Organism ID, Bacteria ESCHERICHIA COLI (A)  Final      Susceptibility   Escherichia coli - MIC*    AMPICILLIN <=2 SENSITIVE Sensitive     CEFAZOLIN  <=4 SENSITIVE Sensitive     CEFTRIAXONE <=1 SENSITIVE Sensitive     CIPROFLOXACIN <=0.25 SENSITIVE Sensitive     GENTAMICIN <=1 SENSITIVE Sensitive     IMIPENEM <=0.25 SENSITIVE Sensitive     NITROFURANTOIN <=16 SENSITIVE Sensitive     TRIMETH/SULFA <=20 SENSITIVE Sensitive     AMPICILLIN/SULBACTAM <=2 SENSITIVE Sensitive     PIP/TAZO <=4 SENSITIVE Sensitive     Extended ESBL NEGATIVE Sensitive     * >=100,000 COLONIES/mL ESCHERICHIA COLI  Blood Culture ID Panel (Reflexed)     Status: None   Collection Time: 05/11/19 11:21 AM  Result Value Ref Range Status   Enterococcus species NOT DETECTED NOT DETECTED Final   Listeria monocytogenes NOT DETECTED NOT DETECTED Final   Staphylococcus species NOT DETECTED NOT DETECTED Final   Staphylococcus aureus (BCID) NOT DETECTED NOT DETECTED Final   Streptococcus species NOT DETECTED NOT DETECTED Final   Streptococcus agalactiae NOT DETECTED NOT DETECTED Final   Streptococcus pneumoniae NOT DETECTED NOT DETECTED Final   Streptococcus pyogenes NOT DETECTED NOT DETECTED Final   Acinetobacter baumannii NOT DETECTED NOT DETECTED Final   Enterobacteriaceae species NOT DETECTED NOT DETECTED Final   Enterobacter cloacae complex NOT DETECTED NOT DETECTED Final   Escherichia coli NOT DETECTED NOT DETECTED Final   Klebsiella oxytoca NOT DETECTED NOT DETECTED Final   Klebsiella pneumoniae NOT DETECTED NOT DETECTED Final   Proteus species NOT DETECTED NOT DETECTED Final   Serratia marcescens NOT DETECTED NOT DETECTED Final   Haemophilus influenzae NOT DETECTED NOT DETECTED Final   Neisseria meningitidis NOT DETECTED NOT DETECTED Final   Pseudomonas aeruginosa NOT DETECTED NOT DETECTED Final   Candida albicans NOT DETECTED NOT DETECTED Final   Candida glabrata NOT DETECTED NOT DETECTED Final   Candida krusei NOT DETECTED NOT DETECTED Final   Candida parapsilosis NOT DETECTED NOT DETECTED Final   Candida tropicalis NOT DETECTED NOT DETECTED Final     Comment: Performed at [facility], [address] [place], [place] [address]  SARS Coronavirus 2 by RT PCR (hospital order, performed in [place] hospital lab) Nasopharyngeal Nasopharyngeal Swab     Status: None   Collection Time: 05/11/19 12:01 PM   Specimen: Nasopharyngeal Swab  Result Value Ref Range Status   SARS Coronavirus 2 NEGATIVE NEGATIVE Final    Comment: (NOTE) If result is NEGATIVE SARS-CoV-2 target nucleic acids are NOT DETECTED. The SARS-CoV-2 RNA is generally detectable in upper and lower  respiratory specimens during the acute phase of infection. The lowest  concentration of SARS-CoV-2 viral copies this assay can detect is 250  copies / mL. A negative result does not preclude SARS-CoV-2  infection  and should not be used as the sole basis for treatment or other  patient management decisions.  A negative result may occur with  improper specimen collection / handling, submission of specimen other  than nasopharyngeal swab, presence of viral mutation(s) within the  areas targeted by this assay, and inadequate number of viral copies  (<250 copies / mL). A negative result must be combined with clinical  observations, patient history, and epidemiological information. If result is POSITIVE SARS-CoV-2 target nucleic acids are DETECTED. The SARS-CoV-2 RNA is generally detectable in upper and lower  respiratory specimens dur ing the acute phase of infection.  Positive  results are indicative of active infection with SARS-CoV-2.  Clinical  correlation with patient history and other diagnostic information is  necessary to determine patient infection status.  Positive results do  not rule out bacterial infection or co-infection with other viruses. If result is PRESUMPTIVE POSTIVE SARS-CoV-2 nucleic acids MAY BE PRESENT.   A presumptive positive result was obtained on the submitted specimen  and confirmed on repeat testing.  While 2019 novel coronavirus  (SARS-CoV-2)  nucleic acids may be present in the submitted sample  additional confirmatory testing may be necessary for epidemiological  and / or clinical management purposes  to differentiate between  SARS-CoV-2 and other Sarbecovirus currently known to infect humans.  If clinically indicated additional testing with an alternate test  methodology [phone]) is advised. The SARS-CoV-2 RNA is generally  detectable in upper and lower respiratory sp ecimens during the acute  phase of infection. The expected result is Negative. Fact Sheet for Patients:  StrictlyIdeas.no Fact Sheet for Healthcare Providers: BankingDealers.co.za This test is not yet approved or cleared by the [place] FDA and has been authorized for detection and/or diagnosis of SARS-CoV-2 by FDA under an Emergency Use Authorization (EUA).  This EUA will remain in effect (meaning this test can be used) for the duration of the COVID-19 declaration under Section 564(b)(1) of the Act, 21 U.S.C. section 360bbb-3(b)(1), unless the authorization is terminated or revoked sooner. Performed at [facility], [address] [place], [place] [address]   Blood Culture (routine x 2)     Status: None   Collection Time: 05/11/19  9:59 PM   Specimen: BLOOD  Result Value Ref Range Status   Specimen Description BLOOD BLOOD RIGHT HAND  Final   Special Requests   Final    BOTTLES DRAWN AEROBIC AND ANAEROBIC Blood Culture adequate volume   Culture   Final    NO GROWTH 5 DAYS Performed at [facility], [address] [place], [place] [address]    Report Status 05/16/2019 FINAL  Final  MRSA PCR Screening     Status: Abnormal   Collection Time: 05/12/19  7:01 PM   Specimen: Nasal Mucosa; Nasopharyngeal  Result Value Ref Range Status   MRSA by PCR POSITIVE (A) NEGATIVE Final    Comment:        The GeneXpert MRSA Assay (FDA approved for NASAL specimens only), is one component of  a comprehensive MRSA colonization surveillance program. It is not intended to diagnose MRSA infection nor to guide or monitor treatment for MRSA infections. CRITICAL RESULT CALLED TO, READ BACK BY AND VERIFIED WITH: Jacqulynn Cadet AT 2103 05/12/2019 KMP Performed at [facility], [address] [place], [place] [address]     Coagulation Studies: No results for input(s): LABPROT, INR in the last 72 hours.  Imaging: Mr Brain Wo Contrast  Result Date: 05/14/2019  CLINICAL DATA:  Altered mental status (AMS), unclear cause. EXAM: MRI HEAD WITHOUT CONTRAST TECHNIQUE: Multiplanar, multiecho pulse sequences of the brain and surrounding structures were obtained without intravenous contrast. COMPARISON:  Head CT examinations 05/12/2019 and earlier FINDINGS: Brain: Multiple sequences are significantly motion degraded, limiting evaluation. A fast brain protocol was utilized. There is no convincing evidence of acute infarct on mildly motion degraded diffusion weighted imaging. No evidence of intracranial mass. No midline shift or extra-axial fluid collection. No chronic intracranial blood products. 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 There is otherwise mild scattered and confluent T2/FLAIR hyperintensity within the cerebral white matter consistent with chronic small vessel ischemic disease. Small T2 hyperintense foci within the bilateral basal ganglia likely reflect a combination of prominent perivascular spaces and chronic lacunar infarcts. Mild generalized parenchymal atrophy. Vascular: Flow voids maintained within the proximal large arterial vessels. Skull and upper cervical spine: No focal marrow lesion identified on motion degraded imaging. Incompletely assessed cervical spondylosis. Sinuses/Orbits: Bilateral lens replacements. No significant paranasal sinus disease or mastoid effusion. IMPRESSION: 1.  Motion degraded examination. 2. No evidence of acute intracranial abnormality. 3. Generalized parenchymal atrophy with chronic small vessel ischemic disease. 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 Electronically Signed   By: Kellie Simmering DO   On: 05/14/2019 17:23    Medications:  I have reviewed the patient's current medications. Scheduled: . chlorhexidine  15 mL Mouth/Throat Q4H  . Chlorhexidine Gluconate Cloth  6 each Topical Daily  . cloNIDine  0.2 mg Transdermal Weekly  . epoetin (EPOGEN/PROCRIT) injection  2,000 Units Intravenous Once  . heparin  5,000 Units Subcutaneous Q8H  . influenza vaccine adjuvanted  0.5 mL Intramuscular Tomorrow-1000  . lidocaine (PF)  5 mL Intradermal Once  . mupirocin ointment  1 application Nasal BID  . pantoprazole (PROTONIX) IV  40 mg Intravenous Q12H  . sodium chloride flush  3 mL Intravenous Q12H    Assessment/Plan: 78 year old female presenting with confusion, SOB, hypoxia and abdominal pain. Blood cultures positive.  Patient on Unasyn.  Appears slightly more purposeful today but remains nonverbal.     Recommendations: 1. EEG pending   LOS: 5 days   Alexis Goodell, MD Neurology [phone] 05/16/2019  12:46 PM

## 2019-05-16 NOTE — Progress Notes (Signed)
eeg completed ° °

## 2019-05-16 NOTE — Progress Notes (Signed)
[place], [place] 05/16/19  Subjective:    Follows with her eyes.  Did not answer any questions. No acute events  Tolerated HD yesterday   Objective:  Vital signs in last 24 hours:  Temp:  [98.6 F (37 C)-98.7 F (37.1 C)] 98.6 F (37 C) (10/27 0525) Pulse Rate:  [76-88] 76 (10/27 0525) Resp:  [18] 18 (10/27 0525) BP: (108-131)/(40-56) 121/48 (10/27 0525) SpO2:  [98 %-99 %] 99 % (10/27 0525)  Weight change:  Filed Weights   05/15/19 0947 05/15/19 1215 05/15/19 1220  Weight: 80 kg 76.1 kg 76.1 kg    Intake/Output:    Intake/Output Summary (Last 24 hours) at 05/16/2019 1339 Last data filed at 05/16/2019 1100 Gross per 24 hour  Intake 120.71 ml  Output 0 ml  Net 120.71 ml     Physical Exam: General:  Lying in the bed, no acute distress  HEENT  moist oral mucous membranes  Pulm/lungs  normal breathing effort, clear  CVS/Heart  no rub  Abdomen:   Soft, nontender  Extremities:  No peripheral edema  Neurologic:  Follows person in the room, non verbal  Skin:  Warm, dry  Access:  Left arm AV graft       Basic Metabolic Panel:  Recent Labs  Lab 05/11/19 1055 05/13/19 0650 05/15/19 2123  NA 137 139 142  K 3.5 3.8 4.4  CL 99 98 100  CO2 25 23 24   GLUCOSE 109* 117* 99  BUN 27* 33* 58*  CREATININE 7.49* 7.39* 8.84*  CALCIUM 8.6* 8.9 9.3  MG  --   --  2.1     CBC: Recent Labs  Lab 05/11/19 1055 05/13/19 0650  WBC 13.6* 13.2*  NEUTROABS 12.0*  --   HGB 11.3* 10.9*  HCT 36.3 35.3*  MCV 98.9 98.9  PLT 199 180      Lab Results  Component Value Date   HEPBSAG NON REACTIVE 05/12/2019      Microbiology:  Recent Results (from the past 240 hour(s))  Blood Culture (routine x 2)     Status: Abnormal   Collection Time: 05/11/19 11:21 AM   Specimen: BLOOD  Result Value Ref Range Status   Specimen Description BLOOD RIGHT ANTECUBITAL  Final   Special Requests   Final    BOTTLES DRAWN AEROBIC AND ANAEROBIC Blood  Culture adequate volume   Culture  Setup Time   Final    GRAM NEGATIVE RODS ANAEROBIC BOTTLE ONLY CRITICAL RESULT CALLED TO, READ BACK BY AND VERIFIED WITH: JASON ROBBINS AT 1644 05/12/2019 KMP    Culture BACTEROIDES FRAGILIS BETA LACTAMASE POSITIVE  (A)  Final   Report Status 05/14/2019 FINAL  Final  Urine culture     Status: Abnormal   Collection Time: 05/11/19 11:21 AM   Specimen: Urine, Random  Result Value Ref Range Status   Specimen Description   Final    URINE, RANDOM Performed at [facility], [address] [place], [place] [address]    Special Requests   Final    NONE Performed at [facility], [address] [place], [place] [address]    Culture >=100,000 COLONIES/mL ESCHERICHIA COLI (A)  Final   Report Status 05/13/2019 FINAL  Final   Organism ID, Bacteria ESCHERICHIA COLI (A)  Final      Susceptibility   Escherichia coli - MIC*    AMPICILLIN <=2 SENSITIVE Sensitive     CEFAZOLIN <=4 SENSITIVE Sensitive     CEFTRIAXONE <=1 SENSITIVE Sensitive  CIPROFLOXACIN <=0.25 SENSITIVE Sensitive     GENTAMICIN <=1 SENSITIVE Sensitive     IMIPENEM <=0.25 SENSITIVE Sensitive     NITROFURANTOIN <=16 SENSITIVE Sensitive     TRIMETH/SULFA <=20 SENSITIVE Sensitive     AMPICILLIN/SULBACTAM <=2 SENSITIVE Sensitive     PIP/TAZO <=4 SENSITIVE Sensitive     Extended ESBL NEGATIVE Sensitive     * >=100,000 COLONIES/mL ESCHERICHIA COLI  Blood Culture ID Panel (Reflexed)     Status: None   Collection Time: 05/11/19 11:21 AM  Result Value Ref Range Status   Enterococcus species NOT DETECTED NOT DETECTED Final   Listeria monocytogenes NOT DETECTED NOT DETECTED Final   Staphylococcus species NOT DETECTED NOT DETECTED Final   Staphylococcus aureus (BCID) NOT DETECTED NOT DETECTED Final   Streptococcus species NOT DETECTED NOT DETECTED Final   Streptococcus agalactiae NOT DETECTED NOT DETECTED Final   Streptococcus pneumoniae NOT DETECTED NOT DETECTED Final    Streptococcus pyogenes NOT DETECTED NOT DETECTED Final   Acinetobacter baumannii NOT DETECTED NOT DETECTED Final   Enterobacteriaceae species NOT DETECTED NOT DETECTED Final   Enterobacter cloacae complex NOT DETECTED NOT DETECTED Final   Escherichia coli NOT DETECTED NOT DETECTED Final   Klebsiella oxytoca NOT DETECTED NOT DETECTED Final   Klebsiella pneumoniae NOT DETECTED NOT DETECTED Final   Proteus species NOT DETECTED NOT DETECTED Final   Serratia marcescens NOT DETECTED NOT DETECTED Final   Haemophilus influenzae NOT DETECTED NOT DETECTED Final   Neisseria meningitidis NOT DETECTED NOT DETECTED Final   Pseudomonas aeruginosa NOT DETECTED NOT DETECTED Final   Candida albicans NOT DETECTED NOT DETECTED Final   Candida glabrata NOT DETECTED NOT DETECTED Final   Candida krusei NOT DETECTED NOT DETECTED Final   Candida parapsilosis NOT DETECTED NOT DETECTED Final   Candida tropicalis NOT DETECTED NOT DETECTED Final    Comment: Performed at [facility], [address] [place], [place] [address]  SARS Coronavirus 2 by RT PCR (hospital order, performed in [place] hospital lab) Nasopharyngeal Nasopharyngeal Swab     Status: None   Collection Time: 05/11/19 12:01 PM   Specimen: Nasopharyngeal Swab  Result Value Ref Range Status   SARS Coronavirus 2 NEGATIVE NEGATIVE Final    Comment: (NOTE) If result is NEGATIVE SARS-CoV-2 target nucleic acids are NOT DETECTED. The SARS-CoV-2 RNA is generally detectable in upper and lower  respiratory specimens during the acute phase of infection. The lowest  concentration of SARS-CoV-2 viral copies this assay can detect is 250  copies / mL. A negative result does not preclude SARS-CoV-2 infection  and should not be used as the sole basis for treatment or other  patient management decisions.  A negative result may occur with  improper specimen collection / handling, submission of specimen other  than nasopharyngeal swab, presence of  viral mutation(s) within the  areas targeted by this assay, and inadequate number of viral copies  (<250 copies / mL). A negative result must be combined with clinical  observations, patient history, and epidemiological information. If result is POSITIVE SARS-CoV-2 target nucleic acids are DETECTED. The SARS-CoV-2 RNA is generally detectable in upper and lower  respiratory specimens dur ing the acute phase of infection.  Positive  results are indicative of active infection with SARS-CoV-2.  Clinical  correlation with patient history and other diagnostic information is  necessary to determine patient infection status.  Positive results do  not rule out bacterial infection or co-infection with other viruses. If result is PRESUMPTIVE POSTIVE SARS-CoV-2 nucleic  acids MAY BE PRESENT.   A presumptive positive result was obtained on the submitted specimen  and confirmed on repeat testing.  While 2019 novel coronavirus  (SARS-CoV-2) nucleic acids may be present in the submitted sample  additional confirmatory testing may be necessary for epidemiological  and / or clinical management purposes  to differentiate between  SARS-CoV-2 and other Sarbecovirus currently known to infect humans.  If clinically indicated additional testing with an alternate test  methodology [phone]) is advised. The SARS-CoV-2 RNA is generally  detectable in upper and lower respiratory sp ecimens during the acute  phase of infection. The expected result is Negative. Fact Sheet for Patients:  StrictlyIdeas.no Fact Sheet for Healthcare Providers: BankingDealers.co.za This test is not yet approved or cleared by the [place] FDA and has been authorized for detection and/or diagnosis of SARS-CoV-2 by FDA under an Emergency Use Authorization (EUA).  This EUA will remain in effect (meaning this test can be used) for the duration of the COVID-19 declaration under Section  564(b)(1) of the Act, 21 U.S.C. section 360bbb-3(b)(1), unless the authorization is terminated or revoked sooner. Performed at [facility], [address] [place], [place] [address]   Blood Culture (routine x 2)     Status: None   Collection Time: 05/11/19  9:59 PM   Specimen: BLOOD  Result Value Ref Range Status   Specimen Description BLOOD BLOOD RIGHT HAND  Final   Special Requests   Final    BOTTLES DRAWN AEROBIC AND ANAEROBIC Blood Culture adequate volume   Culture   Final    NO GROWTH 5 DAYS Performed at [facility], [address] [place], [place] [address]    Report Status 05/16/2019 FINAL  Final  MRSA PCR Screening     Status: Abnormal   Collection Time: 05/12/19  7:01 PM   Specimen: Nasal Mucosa; Nasopharyngeal  Result Value Ref Range Status   MRSA by PCR POSITIVE (A) NEGATIVE Final    Comment:        The GeneXpert MRSA Assay (FDA approved for NASAL specimens only), is one component of a comprehensive MRSA colonization surveillance program. It is not intended to diagnose MRSA infection nor to guide or monitor treatment for MRSA infections. CRITICAL RESULT CALLED TO, READ BACK BY AND VERIFIED WITH: Jacqulynn Cadet AT 2103 05/12/2019 KMP Performed at [facility], [address] [place], [place] [address]     Coagulation Studies: No results for input(s): LABPROT, INR in the last 72 hours.  Urinalysis: No results for input(s): COLORURINE, LABSPEC, PHURINE, GLUCOSEU, HGBUR, BILIRUBINUR, KETONESUR, PROTEINUR, UROBILINOGEN, NITRITE, LEUKOCYTESUR in the last 72 hours.  Invalid input(s): APPERANCEUR    Imaging: Mr Brain Wo Contrast  Result Date: 05/14/2019 CLINICAL DATA:  Altered mental status (AMS), unclear cause. EXAM: MRI HEAD WITHOUT CONTRAST TECHNIQUE: Multiplanar, multiecho pulse sequences of the brain and surrounding structures were obtained without intravenous contrast. COMPARISON:  Head CT examinations 05/12/2019 and  earlier FINDINGS: Brain: Multiple sequences are significantly motion degraded, limiting evaluation. A fast brain protocol was utilized. There is no convincing evidence of acute infarct on mildly motion degraded diffusion weighted imaging. No evidence of intracranial mass. No midline shift or extra-axial fluid collection. No chronic intracranial blood products. 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 There is otherwise mild scattered and confluent T2/FLAIR hyperintensity within the cerebral white matter consistent with chronic small vessel ischemic disease. Small T2 hyperintense foci  within the bilateral basal ganglia likely reflect a combination of prominent perivascular spaces and chronic lacunar infarcts. Mild generalized parenchymal atrophy. Vascular: Flow voids maintained within the proximal large arterial vessels. Skull and upper cervical spine: No focal marrow lesion identified on motion degraded imaging. Incompletely assessed cervical spondylosis. Sinuses/Orbits: Bilateral lens replacements. No significant paranasal sinus disease or mastoid effusion. IMPRESSION: 1. Motion degraded examination. 2. No evidence of acute intracranial abnormality. 3. Generalized parenchymal atrophy with chronic small vessel ischemic disease. 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 Electronically Signed   By: Kellie Simmering DO   On: 05/14/2019 17:23     Medications:   . sodium chloride Stopped (05/15/19 2045)  . ampicillin-sulbactam (UNASYN) IV Stopped (05/15/19 2045)   . chlorhexidine  15 mL Mouth/Throat Q4H  . Chlorhexidine Gluconate Cloth  6 each Topical Daily  . cloNIDine  0.2 mg Transdermal Weekly  . epoetin (EPOGEN/PROCRIT) injection  2,000 Units Intravenous Once  . heparin  5,000 Units Subcutaneous Q8H  . influenza vaccine adjuvanted  0.5 mL Intramuscular  Tomorrow-1000  . lidocaine (PF)  5 mL Intradermal Once  . mupirocin ointment  1 application Nasal BID  . pantoprazole (PROTONIX) IV  40 mg Intravenous Q12H  . sodium chloride flush  3 mL Intravenous Q12H   sodium chloride, acetaminophen **OR** acetaminophen, albuterol, iohexol  Assessment/ Plan:  78 y.o. African-American female  with end stage renal disease on hemodialysis, hypertension, diabetes mellitus type II, diabetic neuropathy who is admitted to [facility] on 05/11/2019 for [facility] MWF-1//[id number]//[facility]//left arm AV graft  1.  End-stage renal disease - will arrange for HD tomorrow  2.  Sepsis with altered mental status Blood cultures from 10/22 are positive for E Coli.  Urine culture positive for E. coli greater than 100,000 colonies CT negative for stroke Neurology team following  3.  Anemia of chronic kidney disease Lab Results  Component Value Date   HGB 10.9 (L) 05/13/2019  Continue low-dose Epogen with hemodialysis  4.  Secondary hyperparathyroidism Lab Results  Component Value Date   CALCIUM 9.3 05/15/2019   PHOS 4.1 07/27/2018   Monitor calcium and phosphorus closely    LOS: [place] 10/27/20201:39 PM  [place], [place]  Note: This note was prepared with Dragon dictation. Any transcription errors are unintentional

## 2019-05-16 NOTE — Evaluation (Signed)
Clinical/Bedside Swallow Evaluation Patient Details  Name: Barbara [name] MRN: [id number] Date of Birth: 1941-04-20  Today's Date: 05/16/2019 Time: SLP Start Time (ACUTE ONLY): 1210 SLP Stop Time (ACUTE ONLY): 1305 SLP Time Calculation (min) (ACUTE ONLY): 55 min  Past Medical History:  Past Medical History:  Diagnosis Date  . Adult celiac disease   . Anemia associated with chronic renal failure 2017   blood transfusion last week 10/17  . Arthritis   . Asthma   . Cancer ([place]) 2017   bladder  . Chronic kidney disease   . 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  dr. Candiss Norse and dr. Holley Raring follow her  . COPD (chronic obstructive pulmonary disease) ([place])   . Coronary artery disease   . Diabetes mellitus without complication ([place])   . Dialysis patient ([place])    Tues, Thurs, Sat  . Dyspnea    with exertion  . Elevated lipids   . GERD (gastroesophageal reflux disease)   . Hematuria   . Hypertension   . Lower back pain   . Neuropathy   . Oxygen dependent    at hs  . Personal history of chemotherapy 2017   bladder ca  . Personal history of radiation therapy 2017   bladder ca  . Sleep apnea    uses cpap  . Urinary obstruction 01/2016   Past Surgical History:  Past Surgical History:  Procedure Laterality Date  . A/V SHUNT INTERVENTION N/A 05/12/2018   Procedure: A/V SHUNT INTERVENTION;  Surgeon: Algernon Huxley, MD;  Location: [facility];  Service: Cardiovascular;  Laterality: N/A;  . A/V SHUNT INTERVENTION Left 07/29/2018   Procedure: A/V SHUNT INTERVENTION;  Surgeon: Katha Cabal, MD;  Location: [facility];  Service: Cardiovascular;  Laterality: Left;  . ABDOMINAL HYSTERECTOMY    . AV FISTULA PLACEMENT Left 02/17/2017   Procedure: INSERTION OF ARTERIOVENOUS (AV) GORE-TEX GRAFT ARM(BRACHIAL AXILLARY);  Surgeon: Katha Cabal, MD;  Location: [facility];  Service: Vascular;  Laterality: Left;  . CYSTOSCOPY W/ RETROGRADES Bilateral 02/17/2016    Procedure: CYSTOSCOPY WITH RETROGRADE PYELOGRAM;  Surgeon: Hollice Espy, MD;  Location: [facility];  Service: Urology;  Laterality: Bilateral;  . CYSTOSCOPY W/ RETROGRADES Bilateral 10/12/2016   Procedure: CYSTOSCOPY WITH RETROGRADE PYELOGRAM;  Surgeon: Hollice Espy, MD;  Location: [facility];  Service: Urology;  Laterality: Bilateral;  . CYSTOSCOPY W/ RETROGRADES Bilateral 06/09/2017   Procedure: CYSTOSCOPY WITH RETROGRADE PYELOGRAM;  Surgeon: Hollice Espy, MD;  Location: [facility];  Service: Urology;  Laterality: Bilateral;  . CYSTOSCOPY W/ RETROGRADES Bilateral 10/07/2017   Procedure: CYSTOSCOPY WITH RETROGRADE PYELOGRAM;  Surgeon: Hollice Espy, MD;  Location: [facility];  Service: Urology;  Laterality: Bilateral;  . CYSTOSCOPY W/ URETERAL STENT PLACEMENT Left 05/12/2016   Procedure: CYSTOSCOPY WITH STENT REPLACEMENT;  Surgeon: Hollice Espy, MD;  Location: [facility];  Service: Urology;  Laterality: Left;  . CYSTOSCOPY W/ URETERAL STENT PLACEMENT Left 10/12/2016   Procedure: CYSTOSCOPY WITH STENT REPLACEMENT;  Surgeon: Hollice Espy, MD;  Location: [facility];  Service: Urology;  Laterality: Left;  . CYSTOSCOPY WITH BIOPSY N/A 06/09/2017   Procedure: CYSTOSCOPY WITH BLADDER BIOPSY;  Surgeon: Hollice Espy, MD;  Location: [facility];  Service: Urology;  Laterality: N/A;  . CYSTOSCOPY WITH STENT PLACEMENT Left 01/21/2016   Procedure: CYSTOSCOPY WITH double J STENT PLACEMENT;  Surgeon: Franchot Gallo, MD;  Location: [facility];  Service: Urology;  Laterality: Left;  . CYSTOSCOPY WITH STENT PLACEMENT Right 10/12/2016  Procedure: CYSTOSCOPY WITH STENT PLACEMENT;  Surgeon: Hollice Espy, MD;  Location: [facility];  Service: Urology;  Laterality: Right;  . DIALYSIS/PERMA CATHETER INSERTION N/A 11/20/2016   Procedure: Dialysis/Perma Catheter Insertion;  Surgeon: Algernon Huxley, MD;  Location: [facility];  Service: Cardiovascular;  Laterality: N/A;  . DIALYSIS/PERMA CATHETER REMOVAL N/A 03/30/2017    Procedure: DIALYSIS/PERMA CATHETER REMOVAL;  Surgeon: Katha Cabal, MD;  Location: [facility];  Service: Cardiovascular;  Laterality: N/A;  . KIDNEY SURGERY  01/21/2016   IR NEPHROSTOMY PLACEMENT LEFT   . PERIPHERAL VASCULAR CATHETERIZATION N/A 04/07/2016   Procedure: Glori Luis Cath Insertion;  Surgeon: Katha Cabal, MD;  Location: [facility];  Service: Cardiovascular;  Laterality: N/A;  . PERIPHERAL VASCULAR THROMBECTOMY Left 06/02/2017   Procedure: PERIPHERAL VASCULAR THROMBECTOMY;  Surgeon: Algernon Huxley, MD;  Location: [facility];  Service: Cardiovascular;  Laterality: Left;  . PERIPHERAL VASCULAR THROMBECTOMY Left 01/28/2018   Procedure: PERIPHERAL VASCULAR THROMBECTOMY;  Surgeon: Katha Cabal, MD;  Location: [facility];  Service: Cardiovascular;  Laterality: Left;  . PERIPHERAL VASCULAR THROMBECTOMY Left 03/14/2018   Procedure: PERIPHERAL VASCULAR THROMBECTOMY;  Surgeon: Algernon Huxley, MD;  Location: [facility];  Service: Cardiovascular;  Laterality: Left;  . PERIPHERAL VASCULAR THROMBECTOMY Left 03/16/2018   Procedure: PERIPHERAL VASCULAR THROMBECTOMY;  Surgeon: Katha Cabal, MD;  Location: [facility];  Service: Cardiovascular;  Laterality: Left;  . PORTA CATH REMOVAL N/A 02/07/2019   Procedure: PORTA CATH REMOVAL;  Surgeon: Katha Cabal, MD;  Location: [facility];  Service: Cardiovascular;  Laterality: N/A;  . PORTACATH PLACEMENT Right   . ROTATOR CUFF REPAIR Left   . TEMPORARY DIALYSIS CATHETER N/A 05/11/2018   Procedure: TEMPORARY DIALYSIS CATHETER;  Surgeon: Katha Cabal, MD;  Location: [facility];  Service: Cardiovascular;  Laterality: N/A;  . TEMPORARY DIALYSIS CATHETER Left 07/27/2018   Procedure: TEMPORARY DIALYSIS CATHETER;  Surgeon: Katha Cabal, MD;  Location: [facility];  Service: Cardiovascular;  Laterality: Left;  . TRANSURETHRAL RESECTION OF BLADDER TUMOR  N/A 02/17/2016   Procedure: TRANSURETHRAL RESECTION OF BLADDER TUMOR (TURBT)-LARGE;  Surgeon: Hollice Espy, MD;  Location: [facility];  Service: Urology;  Laterality: N/A;  . TRANSURETHRAL RESECTION OF BLADDER TUMOR N/A 10/07/2017   Procedure: TRANSURETHRAL RESECTION OF BLADDER TUMOR (TURBT)-small;  Surgeon: Hollice Espy, MD;  Location: [facility];  Service: Urology;  Laterality: N/A;  . URETEROSCOPY Left 02/17/2016   Procedure: URETEROSCOPY;  Surgeon: Hollice Espy, MD;  Location: [facility];  Service: Urology;  Laterality: Left;  . URETEROSCOPY Right 10/12/2016   Procedure: URETEROSCOPY;  Surgeon: Hollice Espy, MD;  Location: [facility];  Service: Urology;  Laterality: Right;   HPI:  Pt is a 78 y.o. female with a known history of multiple medical issues including bladder Ca, end-stage renal disease, HD, chronic kidney disease stage IV, COPD, coronary artery disease, diabetes type 2, GERD, celiac dis., sleep apnea, on O2 at home who was seen in the emergency room with complaint of abdominal pain and shortness of breath.  She was previously fairly independent w/ ADLs at home going to HD txs weekly.  During this admission, pt has exhibited increased confusion, nonverbal, and outbursts per staff reports. She has been NPO.  Noted CXR: vascular congestion, no other acute issuse; MRI: Generalized parenchymal atrophy with chronic small vessel ishemic disease.    Assessment / Plan / Recommendation Clinical Impression  Pt appears  to present w/ risk for oropharyngeal phase dysphagia and risk for aspiration w/ oral intake d/t declined Cognitive awareness for, during, po trials and intake. Pt was accepting of few po trials of TSPs of thin liquids, ice chips w/ this SLP, and she verbally indicated when she had had enough "I don't want anymore" -- a full, intentional sentence w/ this SLP. During the po trials, fairly timely A-P transfer of the TSPs of water w/ pharyngeal swallow following -- no oral holding to speak  of. Single swallows w/ no decline in respiratory effort/status post swallows; no coughing or thtoatl clearing. Vocal quality was clear when she spoke 1x saying she had had enough of the tsp trials of water. Oral phase grossly WFL during bolus management once bolus was accepted; pt was somewhat timid in the bolus acceptance/prep phase and needed much gentle cueing. Generalized oral weakness noted; No gross unilateral weakness noted. Due to pt's emerging Cognitive awareness for task of oral intake and swallowing but risk for aspiration d/t dysphagia, recommend continue po trials w/ ST services only at this time. ST services will f/u tomorrow w/ ongoing po trials, assessment following aspiration precautions. Recommend medications via alternative means. Frequent oral care for hygiene and stimulation of swallowing. Pallatiave Care NP present and updated; NSG/MD updated.  SLP Visit Diagnosis: Dysphagia, oropharyngeal phase (R13.12)(declined Cognitive status)    Aspiration Risk  Moderate aspiration risk;Risk for inadequate nutrition/hydration    Diet Recommendation  NPO w/ therapeutic trials w/ ST services; frequent oral hygiene w/ NSG staff for hygiene and stimulation of swallowing -- aspiration precautions  Medication Administration: Via alternative means    Other  Recommendations Recommended Consults: (Dietician and Palliative Care f/u) Oral Care Recommendations: Oral care QID;Staff/trained caregiver to provide oral care(aspiration precautions) Other Recommendations: (TBD)   Follow up Recommendations Skilled Nursing facility(TBD)      Frequency and Duration min 5x/week  2 weeks       Prognosis Prognosis for Safe Diet Advancement: Guarded Barriers to Reach Goals: Cognitive deficits;Time post onset;Severity of deficits;Behavior      Swallow Study   General Date of Onset: 05/11/19 HPI: Pt is a 78 y.o. female with a known history of multiple medical issues including bladder Ca, end-stage renal  disease, HD, chronic kidney disease stage IV, COPD, coronary artery disease, diabetes type 2, GERD, celiac dis., sleep apnea, on O2 at home who was seen in the emergency room with complaint of abdominal pain and shortness of breath.  She was previously fairly independent w/ ADLs at home going to HD txs weekly.  During this admission, pt has exhibited increased confusion, nonverbal, and outbursts per staff reports. She has been NPO.  Noted CXR: vascular congestion, no other acute issuse; MRI: Generalized parenchymal atrophy with chronic small vessel ishemic disease.  Type of Study: Bedside Swallow Evaluation Previous Swallow Assessment: none reported Diet Prior to this Study: NPO Temperature Spikes Noted: No(wbc 13.2 down from 13.6) Respiratory Status: Nasal cannula(2 L) History of Recent Intubation: No Behavior/Cognition: Alert;Cooperative;Pleasant mood;Confused;Requires cueing Oral Cavity Assessment: (sticky - unable to fully assess d/t Cognitive status) Oral Care Completed by SLP: Yes(attempted w/ pt; limited) Oral Cavity - Dentition: Missing dentition(few noted) Vision: (n/a) Self-Feeding Abilities: Total assist Patient Positioning: Upright in bed(needed positioning) Baseline Vocal Quality: Low vocal intensity(when spoke x1) Volitional Cough: Cognitively unable to elicit Volitional Swallow: Unable to elicit    Oral/Motor/Sensory Function Overall Oral Motor/Sensory Function: Generalized oral weakness(no gross unilateral weakness or droop/pull)   Ice Chips Ice chips: Within functional  limits(grossly) Presentation: Spoon(fed; 4 trials) Other Comments: pt manipulated the ice chips slowly w/ few lingual movements   Thin Liquid Thin Liquid: Impaired Presentation: Spoon(fed; 12+) Oral Phase Impairments: Poor awareness of bolus;Reduced labial seal;Reduced lingual movement/coordination(minimal movements overall) Oral Phase Functional Implications: (leakage at times) Pharyngeal  Phase Impairments:  Suspected delayed Swallow(no overt coughing or throat clearing occurred) Other Comments: fairly timely A-P transfer w/ pharyngeal swallow following -- no oral holding to speak of. Single swallows w/ no decline in respiratory effort/status post swallows; no coughing or thtoatl clearing. Vocal quality was clear when she spoke 1x saying she had had enough of the tsp trials of water; "    Nectar Thick Nectar Thick Liquid: Not tested   Honey Thick Honey Thick Liquid: Not tested   Puree Puree: Not tested   Solid     Solid: Not tested       [name], MS, CCC-SLP , 05/16/2019,4:17 PM

## 2019-05-16 NOTE — Progress Notes (Signed)
[place], is a 78 y.o. female, DOB - 1941-02-17, QQV:[id number]  Admit date - 05/11/2019   Admitting Physician Dustin Flock, MD  Outpatient Primary MD for the patient is [name], MD   LOS - 5  Subjective: Continues to not be able to communicate she is able to track with her eyes    Review of Systems:   CONSTITUTIONAL:unable to provide  Vitals:   Vitals:   05/15/19 1220 05/15/19 2037 05/15/19 2224 05/16/19 0525  BP: (!) 109/59 (!) 108/40 (!) 131/56 (!) 121/48  Pulse: 95 88 88 76  Resp: (!) 22 18  18   Temp: 98.4 F (36.9 C) 98.7 F (37.1 C)  98.6 F (37 C)  TempSrc: Oral Oral  Oral  SpO2:  98%  99%  Weight: 76.1 kg     Height:        Wt Readings from Last 3 Encounters:  05/15/19 76.1 kg  02/28/19 81.6 kg  02/07/19 81.6 kg     Intake/Output Summary (Last 24 hours) at 05/16/2019 1154 Last data filed at 05/15/2019 2100 Gross per 24 hour  Intake 120.71 ml  Output 337 ml  Net -216.29 ml    Physical Exam:   GENERAL: obsese chronically ill appearing  HEAD, EYES, EARS, NOSE AND THROAT: Atraumatic, normocephalic. Extraocular muscles are intact. Pupils equal and reactive to light. Sclerae anicteric. No conjunctival injection. No oro-pharyngeal erythema.  NECK: Supple. There is no jugular venous distention. No bruits, no lymphadenopathy, no thyromegaly.  HEART: Regular rate and rhythm,. No murmurs, no rubs, no clicks.  LUNGS: Clear to auscultation bilaterally. No rales or rhonchi. No wheezes.  ABDOMEN: Soft, flat, nontender, nondistended. Has good bowel sounds. No hepatosplenomegaly appreciated.  EXTREMITIES: No evidence of any cyanosis, clubbing, or peripheral edema.  +2 pedal and radial pulses bilaterally.   NEUROLOGIC: Patient awake but not communicating not following commands she tracks with her eyes SKIN: Moist and warm with no rashes appreciated.  Psych: Not anxious, depressed LN: No inguinal LN enlargement    Antibiotics   Anti-infectives (From admission, onward)   Start     Dose/Rate Route Frequency Ordered Stop   05/15/19 1800  Ampicillin-Sulbactam (UNASYN) 3 g in sodium chloride 0.9 % 100 mL IVPB     3 g 200 mL/hr over 30 Minutes Intravenous Every 12 hours 05/15/19 1343     05/15/19 1200  vancomycin (VANCOCIN) IVPB 1000 mg/200  mL premix  Status:  Discontinued     1,000 mg 200 mL/hr over 60 Minutes Intravenous Every M-W-F (Hemodialysis) 05/12/19 1420 05/12/19 1917   05/14/19 2330  metroNIDAZOLE (FLAGYL) IVPB 500 mg  Status:  Discontinued     500 mg 100 mL/hr over 60 Minutes Intravenous Every 8 hours 05/14/19 2318 05/15/19 1342   05/12/19 1800  ceFEPIme (MAXIPIME) 1 g in sodium chloride 0.9 % 100 mL IVPB  Status:  Discontinued     1 g 200 mL/hr over 30 Minutes Intravenous Every 24 hours 05/12/19 1420 05/15/19 1342   05/12/19 1600  vancomycin (VANCOCIN) IVPB 1000 mg/200 mL premix     1,000 mg 200 mL/hr over 60 Minutes Intravenous  Once 05/12/19 1420 05/12/19 1656   05/11/19 1530  cefTRIAXone (ROCEPHIN) 2 g in sodium chloride 0.9 % 100 mL IVPB  Status:  Discontinued     2 g 200 mL/hr over 30 Minutes Intravenous Every 24 hours 05/11/19 1520 05/12/19 0759   05/11/19 1530  azithromycin (ZITHROMAX) 500 mg in sodium chloride 0.9 % 250 mL IVPB  Status:  Discontinued     500 mg 250 mL/hr over 60 Minutes Intravenous Every 24 hours 05/11/19 1520 05/12/19 0759   05/11/19 1300  vancomycin (VANCOCIN) 500 mg in sodium chloride 0.9 % 100 mL IVPB     500 mg 100 mL/hr over 60 Minutes Intravenous  Once 05/11/19 1116 05/11/19 1539   05/11/19 1100  vancomycin (VANCOCIN) IVPB 1000 mg/200 mL premix     1,000 mg 200 mL/hr over 60 Minutes Intravenous  Once 05/11/19 1054 05/11/19 1534   05/11/19 1100   ceFEPIme (MAXIPIME) 2 g in sodium chloride 0.9 % 100 mL IVPB     2 g 200 mL/hr over 30 Minutes Intravenous  Once 05/11/19 1054 05/11/19 1219      Medications   Scheduled Meds: . chlorhexidine  15 mL Mouth/Throat Q4H  . Chlorhexidine Gluconate Cloth  6 each Topical Daily  . cloNIDine  0.2 mg Transdermal Weekly  . epoetin (EPOGEN/PROCRIT) injection  2,000 Units Intravenous Once  . heparin  5,000 Units Subcutaneous Q8H  . influenza vaccine adjuvanted  0.5 mL Intramuscular Tomorrow-1000  . lidocaine (PF)  5 mL Intradermal Once  . mupirocin ointment  1 application Nasal BID  . pantoprazole (PROTONIX) IV  40 mg Intravenous Q12H  . sodium chloride flush  3 mL Intravenous Q12H   Continuous Infusions: . sodium chloride Stopped (05/15/19 2045)  . ampicillin-sulbactam (UNASYN) IV Stopped (05/15/19 2045)   PRN Meds:.sodium chloride, acetaminophen **OR** acetaminophen, albuterol, iohexol   Data Review:   Micro Results Recent Results (from the past 240 hour(s))  Blood Culture (routine x 2)     Status: Abnormal   Collection Time: 05/11/19 11:21 AM   Specimen: BLOOD  Result Value Ref Range Status   Specimen Description BLOOD RIGHT ANTECUBITAL  Final   Special Requests   Final    BOTTLES DRAWN AEROBIC AND ANAEROBIC Blood Culture adequate volume   Culture  Setup Time   Final    GRAM NEGATIVE RODS ANAEROBIC BOTTLE ONLY CRITICAL RESULT CALLED TO, READ BACK BY AND VERIFIED WITH: JASON ROBBINS AT 1644 05/12/2019 KMP    Culture BACTEROIDES FRAGILIS BETA LACTAMASE POSITIVE  (A)  Final   Report Status 05/14/2019 FINAL  Final  Urine culture     Status: Abnormal   Collection Time: 05/11/19 11:21 AM   Specimen: Urine, Random  Result Value Ref Range Status   Specimen Description   Final  URINE, RANDOM Performed at [facility], [address] [place], [place] [address]    Special Requests   Final    NONE Performed at [facility], [address] [place],  [place] [address]    Culture >=100,000 COLONIES/mL ESCHERICHIA COLI (A)  Final   Report Status 05/13/2019 FINAL  Final   Organism ID, Bacteria ESCHERICHIA COLI (A)  Final      Susceptibility   Escherichia coli - MIC*    AMPICILLIN <=2 SENSITIVE Sensitive     CEFAZOLIN <=4 SENSITIVE Sensitive     CEFTRIAXONE <=1 SENSITIVE Sensitive     CIPROFLOXACIN <=0.25 SENSITIVE Sensitive     GENTAMICIN <=1 SENSITIVE Sensitive     IMIPENEM <=0.25 SENSITIVE Sensitive     NITROFURANTOIN <=16 SENSITIVE Sensitive     TRIMETH/SULFA <=20 SENSITIVE Sensitive     AMPICILLIN/SULBACTAM <=2 SENSITIVE Sensitive     PIP/TAZO <=4 SENSITIVE Sensitive     Extended ESBL NEGATIVE Sensitive     * >=100,000 COLONIES/mL ESCHERICHIA COLI  Blood Culture ID Panel (Reflexed)     Status: None   Collection Time: 05/11/19 11:21 AM  Result Value Ref Range Status   Enterococcus species NOT DETECTED NOT DETECTED Final   Listeria monocytogenes NOT DETECTED NOT DETECTED Final   Staphylococcus species NOT DETECTED NOT DETECTED Final   Staphylococcus aureus (BCID) NOT DETECTED NOT DETECTED Final   Streptococcus species NOT DETECTED NOT DETECTED Final   Streptococcus agalactiae NOT DETECTED NOT DETECTED Final   Streptococcus pneumoniae NOT DETECTED NOT DETECTED Final   Streptococcus pyogenes NOT DETECTED NOT DETECTED Final   Acinetobacter baumannii NOT DETECTED NOT DETECTED Final   Enterobacteriaceae species NOT DETECTED NOT DETECTED Final   Enterobacter cloacae complex NOT DETECTED NOT DETECTED Final   Escherichia coli NOT DETECTED NOT DETECTED Final   Klebsiella oxytoca NOT DETECTED NOT DETECTED Final   Klebsiella pneumoniae NOT DETECTED NOT DETECTED Final   Proteus species NOT DETECTED NOT DETECTED Final   Serratia marcescens NOT DETECTED NOT DETECTED Final   Haemophilus influenzae NOT DETECTED NOT DETECTED Final   Neisseria meningitidis NOT DETECTED NOT DETECTED Final   Pseudomonas aeruginosa NOT DETECTED NOT DETECTED Final    Candida albicans NOT DETECTED NOT DETECTED Final   Candida glabrata NOT DETECTED NOT DETECTED Final   Candida krusei NOT DETECTED NOT DETECTED Final   Candida parapsilosis NOT DETECTED NOT DETECTED Final   Candida tropicalis NOT DETECTED NOT DETECTED Final    Comment: Performed at [facility], [address] [place], [place] [address]  SARS Coronavirus 2 by RT PCR (hospital order, performed in [place] hospital lab) Nasopharyngeal Nasopharyngeal Swab     Status: None   Collection Time: 05/11/19 12:01 PM   Specimen: Nasopharyngeal Swab  Result Value Ref Range Status   SARS Coronavirus 2 NEGATIVE NEGATIVE Final    Comment: (NOTE) If result is NEGATIVE SARS-CoV-2 target nucleic acids are NOT DETECTED. The SARS-CoV-2 RNA is generally detectable in upper and lower  respiratory specimens during the acute phase of infection. The lowest  concentration of SARS-CoV-2 viral copies this assay can detect is 250  copies / mL. A negative result does not preclude SARS-CoV-2 infection  and should not be used as the sole basis for treatment or other  patient management decisions.  A negative result may occur with  improper specimen collection / handling, submission of specimen other  than nasopharyngeal swab, presence of viral mutation(s) within the  areas targeted by this assay, and inadequate  number of viral copies  (<250 copies / mL). A negative result must be combined with clinical  observations, patient history, and epidemiological information. If result is POSITIVE SARS-CoV-2 target nucleic acids are DETECTED. The SARS-CoV-2 RNA is generally detectable in upper and lower  respiratory specimens dur ing the acute phase of infection.  Positive  results are indicative of active infection with SARS-CoV-2.  Clinical  correlation with patient history and other diagnostic information is  necessary to determine patient infection status.  Positive results do  not rule out bacterial  infection or co-infection with other viruses. If result is PRESUMPTIVE POSTIVE SARS-CoV-2 nucleic acids MAY BE PRESENT.   A presumptive positive result was obtained on the submitted specimen  and confirmed on repeat testing.  While 2019 novel coronavirus  (SARS-CoV-2) nucleic acids may be present in the submitted sample  additional confirmatory testing may be necessary for epidemiological  and / or clinical management purposes  to differentiate between  SARS-CoV-2 and other Sarbecovirus currently known to infect humans.  If clinically indicated additional testing with an alternate test  methodology [phone]) is advised. The SARS-CoV-2 RNA is generally  detectable in upper and lower respiratory sp ecimens during the acute  phase of infection. The expected result is Negative. Fact Sheet for Patients:  StrictlyIdeas.no Fact Sheet for Healthcare Providers: BankingDealers.co.za This test is not yet approved or cleared by the [place] FDA and has been authorized for detection and/or diagnosis of SARS-CoV-2 by FDA under an Emergency Use Authorization (EUA).  This EUA will remain in effect (meaning this test can be used) for the duration of the COVID-19 declaration under Section 564(b)(1) of the Act, 21 U.S.C. section 360bbb-3(b)(1), unless the authorization is terminated or revoked sooner. Performed at [facility], [address] [place], [place] [address]   Blood Culture (routine x 2)     Status: None   Collection Time: 05/11/19  9:59 PM   Specimen: BLOOD  Result Value Ref Range Status   Specimen Description BLOOD BLOOD RIGHT HAND  Final   Special Requests   Final    BOTTLES DRAWN AEROBIC AND ANAEROBIC Blood Culture adequate volume   Culture   Final    NO GROWTH 5 DAYS Performed at [facility], [address] [place], [place] [address]    Report Status 05/16/2019 FINAL  Final  MRSA PCR Screening     Status:  Abnormal   Collection Time: 05/12/19  7:01 PM   Specimen: Nasal Mucosa; Nasopharyngeal  Result Value Ref Range Status   MRSA by PCR POSITIVE (A) NEGATIVE Final    Comment:        The GeneXpert MRSA Assay (FDA approved for NASAL specimens only), is one component of a comprehensive MRSA colonization surveillance program. It is not intended to diagnose MRSA infection nor to guide or monitor treatment for MRSA infections. CRITICAL RESULT CALLED TO, READ BACK BY AND VERIFIED WITH: Jacqulynn Cadet AT 2103 05/12/2019 KMP Performed at [facility], [address], [place], [place] [address]     Radiology Reports Ct Abdomen Pelvis Wo Contrast  Result Date: 05/11/2019 CLINICAL DATA:  Abdominal pain, fever.  History of bladder cancer EXAM: CT ABDOMEN AND PELVIS WITHOUT CONTRAST TECHNIQUE: Multidetector CT imaging of the abdomen and pelvis was performed following the standard protocol without IV contrast. COMPARISON:  01/19/2018 FINDINGS: Lower chest: Small bilateral pleural effusions. Cardiomegaly. Bibasilar airspace opacities likely atelectasis. Hepatobiliary: No focal hepatic abnormality. Gallbladder unremarkable. Pancreas: No focal abnormality or ductal dilatation.  Spleen: No focal abnormality.  Normal size. Adrenals/Urinary Tract: Severe bilateral hydroureteronephrosis, unchanged. Bladder wall is markedly thickened and stable since prior study. No visible adrenal lesion. Stomach/Bowel: Stomach, large and small bowel grossly unremarkable. Vascular/Lymphatic: Heavily calcified aorta and iliac vessels. Right common iliac lymph node again noted with a short axis diameter of 1.5 cm, stable. Right inguinal lymph node has a short axis diameter of 7 mm, stable. Reproductive: Prior hysterectomy.  No adnexal masses. Other: No free fluid or free air. Musculoskeletal: No acute bony abnormality. IMPRESSION: Continued marked bladder wall thickening and severe bilateral hydronephrosis. Findings similar to  prior study. Heavily calcified aorta and iliac vessels. New small bilateral pleural effusions. Bibasilar opacities, likely atelectasis. Stable mildly enlarged and borderline sized right common iliac and right inguinal lymph nodes. Electronically Signed   By: Rolm Baptise M.D.   On: 05/11/2019 13:51   Ct Head Wo Contrast  Result Date: 05/12/2019 CLINICAL DATA:  Altered mental status. EXAM: CT HEAD WITHOUT CONTRAST TECHNIQUE: Contiguous axial images were obtained from the base of the skull through the vertex without intravenous contrast. COMPARISON:  Head CT 05/11/2017 FINDINGS: Brain: Stable age related cerebral atrophy, ventriculomegaly and periventricular white matter disease. No extra-axial fluid collections are identified. No CT findings for acute hemispheric infarction or intracranial hemorrhage. No mass lesions. The brainstem and cerebellum are normal. Vascular: Stable vascular calcifications. No aneurysm or hyperdense vessels. Skull: No skull fracture or bone lesions. Sinuses/Orbits: The paranasal sinuses and mastoid air cells are clear. The globes are intact. Other: No scalp lesions or hematoma. IMPRESSION: 1. Stable age related cerebral atrophy, ventriculomegaly and periventricular white matter disease. 2. No acute intracranial findings or mass lesions. Electronically Signed   By: Marijo Sanes M.D.   On: 05/12/2019 08:51   Mr Brain Wo Contrast  Result Date: 05/14/2019 CLINICAL DATA:  Altered mental status (AMS), unclear cause. EXAM: MRI HEAD WITHOUT CONTRAST TECHNIQUE: Multiplanar, multiecho pulse sequences of the brain and surrounding structures were obtained without intravenous contrast. COMPARISON:  Head CT examinations 05/12/2019 and earlier FINDINGS: Brain: Multiple sequences are significantly motion degraded, limiting evaluation. A fast brain protocol was utilized. There is no convincing evidence of acute infarct on mildly motion degraded diffusion weighted imaging. No evidence of  intracranial mass. No midline shift or extra-axial fluid collection. No chronic intracranial blood products. 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 There is otherwise mild scattered and confluent T2/FLAIR hyperintensity within the cerebral white matter consistent with chronic small vessel ischemic disease. Small T2 hyperintense foci within the bilateral basal ganglia likely reflect a combination of prominent perivascular spaces and chronic lacunar infarcts. Mild generalized parenchymal atrophy. Vascular: Flow voids maintained within the proximal large arterial vessels. Skull and upper cervical spine: No focal marrow lesion identified on motion degraded imaging. Incompletely assessed cervical spondylosis. Sinuses/Orbits: Bilateral lens replacements. No significant paranasal sinus disease or mastoid effusion. IMPRESSION: 1. Motion degraded examination. 2. No evidence of acute intracranial abnormality. 3. Generalized parenchymal atrophy with chronic small vessel ischemic disease. 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 Electronically Signed   By: Kellie Simmering DO   On: 05/14/2019 17:23   Dg Chest Port 1 View  Result Date: 05/11/2019 CLINICAL DATA:  Sepsis EXAM: PORTABLE CHEST 1 VIEW COMPARISON:  05/10/2018 FINDINGS: Cardiac shadow is mildly prominent but accentuated by the portable technique. Further stenting in the left innominate  vein and left subclavian vein is noted. Right chest wall port is been removed in the interval. Diffuse vascular congestion is noted with mild interstitial edema. This may be related to a degree of volume overload related to the end-stage renal failure. No focal confluent infiltrate is seen. No sizable effusion is noted. No bony abnormality is noted. IMPRESSION: Vascular congestion likely related to the known renal failure and  volume overload. No other focal abnormality is seen. Electronically Signed   By: Inez Catalina M.D.   On: 05/11/2019 12:11     CBC Recent Labs  Lab 05/11/19 1055 05/13/19 0650  WBC 13.6* 13.2*  HGB 11.3* 10.9*  HCT 36.3 35.3*  PLT 199 180  MCV 98.9 98.9  MCH 30.8 30.5  MCHC 31.1 30.9  RDW 13.4 13.4  LYMPHSABS 0.8  --   MONOABS 0.7  --   EOSABS 0.0  --   BASOSABS 0.0  --     Chemistries  Recent Labs  Lab 05/11/19 1055 05/13/19 0650 05/15/19 2123  NA 137 139 142  K 3.5 3.8 4.4  CL 99 98 100  CO2 25 23 24   GLUCOSE 109* 117* 99  BUN 27* 33* 58*  CREATININE 7.49* 7.39* 8.84*  CALCIUM 8.6* 8.9 9.3  MG  --   --  2.1  AST 13*  --   --   ALT 8  --   --   ALKPHOS 89  --   --   BILITOT 0.8  --   --    ------------------------------------------------------------------------------------------------------------------ estimated creatinine clearance is 5.2 mL/min (A) (by C-G formula based on SCr of 8.84 mg/dL (H)). ------------------------------------------------------------------------------------------------------------------ No results for input(s): HGBA1C in the last 72 hours. ------------------------------------------------------------------------------------------------------------------ No results for input(s): CHOL, HDL, LDLCALC, TRIG, CHOLHDL, LDLDIRECT in the last 72 hours. ------------------------------------------------------------------------------------------------------------------ No results for input(s): TSH, T4TOTAL, T3FREE, THYROIDAB in the last 72 hours.  Invalid input(s): FREET3 ------------------------------------------------------------------------------------------------------------------ No results for input(s): VITAMINB12, FOLATE, FERRITIN, TIBC, IRON, RETICCTPCT in the last 72 hours.  Coagulation profile No results for input(s): INR, PROTIME in the last 168 hours.  No results for input(s): DDIMER in the last 72 hours.  Cardiac Enzymes No results  for input(s): CKMB, TROPONINI, MYOGLOBIN in the last 168 hours.  Invalid input(s): CK ------------------------------------------------------------------------------------------------------------------ Invalid input(s): POCBNP    Assessment & Plan   IMPRESSION AND PLAN: Patient 78 year old presenting with abdominal pain confusion and hypoxia  1.   pneumonia / uti  Positive blood cultures for  BACTEROIDES FRAGILIS -continue Unasyn Repeat blood cultures   2.  Acute encephalopathy  No significant improvement Mri shows no acute abnormality  eeg pending  I will ask speech to do a swallow eval  3.  End-stage renal disease nephrology has been consulted for hemodialysis continue PhosLo and Sensipar  4.  Diabetes type 2 continue sliding-scale insulin   5.  Sleep apnea CPAP at bedtime  6.  GERD Protonix IV  7 hypertension hold bp meds on clondine tts   Failure to improve.  Palliative care consult is pending       Code Status Orders  (From admission, onward)         Start     Ordered   05/11/19 2137  Full code  Continuous     05/11/19 2136        Code Status History    Date Active Date Inactive Code Status Order ID Comments User Context   07/27/2018 1819 07/30/2018 1640 Full Code [id number]  Mayo, Pete Pelt, MD Inpatient  05/10/2018 1831 05/13/2018 2249 Full Code [id number]  Hillary Bow, MD ED   01/24/2017 2348 01/29/2017 1614 Full Code [id number]  [place], Victor, DO Inpatient   11/19/2016 1528 11/23/2016 2156 Full Code [id number]  Loletha Grayer, MD Inpatient   06/05/2016 0108 06/08/2016 1345 Full Code [id number]  Lance Coon, MD Inpatient   05/12/2016 1432 05/15/2016 1746 Full Code [id number]  Lytle Butte, MD Inpatient   05/04/2016 1455 05/09/2016 1828 Full Code [id number]  Epifanio Lesches, MD ED   04/17/2016 1336 04/20/2016 2041 Full Code [id number]  Demetrios Loll, MD Inpatient   03/04/2016 1358 03/10/2016 1440 Full Code [id number]  Hillary Bow, MD ED    01/21/2016 2115 01/27/2016 1801 Full Code [id number]  Edwin Dada, MD Inpatient   01/20/2016 1841 01/21/2016 2115 Full Code [id number]  Theodoro Grist, MD Inpatient   12/18/2015 0214 12/23/2015 1651 Full Code [id number]  Saundra Shelling, MD Inpatient   Advance Care Planning Activity           Consults  none   DVT Prophylaxis  Lovenox   Lab Results  Component Value Date   PLT 180 05/13/2019     Time Spent in minutes  35mn  Greater than 50% of time spent in care coordination and counseling patient regarding the condition and plan of care.   SDustin FlockM.D on 05/16/2019 at 11:54 AM  Between 7am to 6pm - Pager - [phone]  After 6pm go to [email or web address] - [email or web address]   Office  [phone]

## 2019-05-16 NOTE — Consult Note (Signed)
Consultation Note Date: 05/16/2019   Patient Name: Barbara [name]  DOB: 10-29-1940  MRN: [id number]  Age / Sex: 78 y.o., female  PCP: [name], MD Referring Physician: Dustin Flock, MD  Reason for Consultation: Establishing goals of care  HPI/Patient Profile:  Barbara [name]  is a 78 y.o. female with a known history of end-stage renal disease, chronic kidney disease stage IV, COPD, coronary artery disease, diabetes type 2, GERD with history of sleep apnea who was seen in the emergency room with complaint of abdominal pain and shortness of breath.  Clinical Assessment and Goals of Care: Patient is resting in bed. She opens her eyes to look at this writer but is not verbal. No family at bedside.  Spoke with her husband via phone call. Barbara [name] lives at home with her husband. They have been married 17 years. She has 2 children.   Per husband, functionally, prior to this admission, she used a cane, did house work, cooked, performed ADL's, and drove. She has been on dialysis for 3-4 years, and sounds to have tolerated it well. He states she has been complaining of swallowing difficulty intermittently for the past 3-4 weeks, but cannot advise what the difficulty was.   Requested to enter with SLP. Patient took a few teaspoons of water. She then stated " I don't want anymore." No other verbal communication.     Family meeting tomorrow with husband at 1:30.     SUMMARY OF RECOMMENDATIONS   Family meeting tomorrow at 1:30.   Prognosis:   Unable to determine  Discharge Planning: To Be Determined      Primary Diagnoses: Present on Admission: . PNA (pneumonia)   I have reviewed the medical record, interviewed the patient and family, and examined the patient. The following aspects are pertinent.  Past Medical History:  Diagnosis Date  . Adult celiac disease   . Anemia associated with chronic  renal failure 2017   blood transfusion last week 10/17  . Arthritis   . Asthma   . 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 Chronic kidney disease   . 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  dr. Candiss Norse and dr. Holley Raring follow her  . COPD (chronic obstructive pulmonary disease) ([place])   . Coronary artery disease   . Diabetes mellitus without complication ([place])   . Dialysis patient ([place])    Tues, Thurs, Sat  . Dyspnea    with exertion  . Elevated lipids   . GERD (gastroesophageal reflux disease)   . Hematuria   . Hypertension   . Lower back pain   . Neuropathy   . Oxygen dependent    at hs  . Personal history of chemotherapy 2017   bladder ca  . Personal history of radiation therapy 2017   bladder ca  . Sleep apnea    uses cpap  . Urinary obstruction 01/2016   Social History   Socioeconomic History  . Marital status: Married    Spouse name: Not  on file  . Number of children: Not on file  . Years of education: Not on file  . Highest education level: Not on file  Occupational History  . Occupation: retired  Scientific laboratory technician  . Financial resource strain: Not on file  . Food insecurity    Worry: Not on file    Inability: Not on file  . Transportation needs    Medical: Not on file    Non-medical: Not on file  Tobacco Use  . Smoking status: Former Smoker    Types: Cigarettes    Quit date: 07/20/2002    Years since quitting: 16.8  . Smokeless tobacco: Never Used  . Tobacco comment: 01/22/2016   "  quit smoking many years ago "  Substance and Sexual Activity  . Alcohol use: No  . Drug use: No  . Sexual activity: Never  Lifestyle  . Physical activity    Days per week: Not on file    Minutes per session: Not on file  . Stress: Not on file  Relationships  . Social Herbalist on phone: Not on file    Gets together: Not on file    Attends religious service: Not on file    Active member of club or organization: Not on file    Attends meetings of clubs or  organizations: Not on file    Relationship status: Not on file  Other Topics Concern  . Not on file  Social History Narrative  . Not on file   Family History  Problem Relation Age of Onset  . Diabetes Mother   . Cancer Father   . Breast cancer Neg Hx   . Kidney disease Neg Hx    Scheduled Meds: . chlorhexidine  15 mL Mouth/Throat Q4H  . Chlorhexidine Gluconate Cloth  6 each Topical Daily  . cloNIDine  0.2 mg Transdermal Weekly  . epoetin (EPOGEN/PROCRIT) injection  2,000 Units Intravenous Once  . heparin  5,000 Units Subcutaneous Q8H  . influenza vaccine adjuvanted  0.5 mL Intramuscular Tomorrow-1000  . lidocaine (PF)  5 mL Intradermal Once  . mupirocin ointment  1 application Nasal BID  . pantoprazole (PROTONIX) IV  40 mg Intravenous Q12H  . sodium chloride flush  3 mL Intravenous Q12H   Continuous Infusions: . sodium chloride Stopped (05/15/19 2045)  . ampicillin-sulbactam (UNASYN) IV Stopped (05/15/19 2045)   PRN Meds:.sodium chloride, acetaminophen **OR** acetaminophen, albuterol, iohexol Medications Prior to Admission:  Prior to Admission medications   Medication Sig Start Date End Date Taking? Authorizing Provider  atorvastatin (LIPITOR) 40 MG tablet Take 40 mg by mouth every evening. 05/01/19  Yes [provider]  calcium acetate (PHOSLO) 667 MG capsule Take 2 capsules (1,334 mg total) by mouth 3 (three) times daily with meals. Patient taking differently: Take 1,334 mg by mouth See admin instructions. Take 1334 mg with each meal and snack 11/23/16  Yes Fritzi Mandes, MD  calcium carbonate (TUMS - DOSED IN MG ELEMENTAL CALCIUM) 500 MG chewable tablet Chew 1 tablet by mouth daily.   Yes [provider]  cholecalciferol (VITAMIN D) 1000 units tablet Take 1,000 Units by mouth daily.   Yes [provider]  cinacalcet (SENSIPAR) 30 MG tablet Take 30 mg by mouth daily. With largest meal   Yes [provider]  esomeprazole (NEXIUM) 40 MG capsule  Take 40 mg by mouth daily after breakfast.    Yes [provider]  ferrous sulfate 325 (65 FE) MG EC tablet  Take 325 mg by mouth daily after breakfast.    Yes [provider]  furosemide (LASIX) 40 MG tablet Take 40 mg by mouth 2 (two) times daily.    Yes [provider]  gabapentin (NEURONTIN) 300 MG capsule Take 600 mg by mouth 3 (three) times daily.   Yes [provider]  losartan (COZAAR) 50 MG tablet Take 50 mg by mouth every evening. 01/09/19  Yes [provider]  mirabegron ER (MYRBETRIQ) 25 MG TB24 tablet Take 25 mg by mouth daily.  05/27/18  Yes [provider]  oxybutynin (DITROPAN) 5 MG tablet Take 5 mg by mouth 3 (three) times daily.   Yes [provider]  ACCU-CHEK AVIVA PLUS test strip 1 each by Other route daily. use as directed 01/26/17   [provider]  ACCU-CHEK SOFTCLIX LANCETS lancets INJECT 1 EACH AS DIRECTED ONCE DAILY. 05/28/17   [provider]  acetaminophen (TYLENOL) 500 MG tablet Take 1,000 mg by mouth 2 (two) times daily as needed for moderate pain or headache.     [provider]  albuterol (PROVENTIL HFA;VENTOLIN HFA) 108 (90 Base) MCG/ACT inhaler Inhale 2 puffs into the lungs every 6 (six) hours as needed for wheezing or shortness of breath.  05/16/18   [provider]  Blood Glucose Monitoring Suppl (ACCU-CHEK AVIVA PLUS) w/Device KIT (USE TO MONITOR BLOOD SUGARS.) 12/30/17   [provider]  diazepam (VALIUM) 2 MG tablet Take 2 mg by mouth every 12 (twelve) hours as needed for anxiety. 03/21/19   [provider]  glucose blood test strip USE AS DIRECTED ONCE DAILY 05/23/18   [provider]  ibuprofen (ADVIL) 200 MG tablet Take 200 mg by mouth every 6 (six) hours as needed for moderate pain.     [provider]  loperamide (IMODIUM) 1 MG/5ML solution Take 2 mg by mouth as needed for diarrhea or loose stools.    [provider]  NON  FORMULARY Place 1 Units into the nose at bedtime. CPAP Time of use 2100-0600    [provider]  OXYGEN Inhale 2 L into the lungs at bedtime.    [provider]   Allergies  Allergen Reactions  . Glipizide Er Other (See Comments)    Hypoglycemia   . Percocet [Oxycodone-Acetaminophen] Hives   Review of Systems  Unable to perform ROS   Physical Exam Pulmonary:     Effort: Pulmonary effort is normal.  Neurological:     Mental Status: She is alert.     Vital Signs: BP (!) 121/48 (BP Location: Right Arm)   Pulse 76   Temp 98.6 F (37 C) (Oral)   Resp 18   Ht _0  (1.727 m)   Wt 76.1 kg   SpO2 99%   BMI 25.51 kg/m  Pain Scale: 0-10   Pain Score: 0-No pain   SpO2: SpO2: 99 % O2 Device:SpO2: 99 % O2 Flow Rate: .O2 Flow Rate (L/min): 2 L/min  IO: Intake/output summary:   Intake/Output Summary (Last 24 hours) at 05/16/2019 1310 Last data filed at 05/16/2019 1100 Gross per 24 hour  Intake 120.71 ml  Output 0 ml  Net 120.71 ml    LBM: Last BM Date: 05/15/19 Baseline Weight: Weight: 82 kg Most recent weight: Weight: 76.1 kg     Palliative Assessment/Data:     Time In: 12:55 Time Out: 1:20 Time Total: 30 min Greater than 50%  of this time was spent counseling and coordinating care related  to the above assessment and plan.  Signed by: Asencion Gowda, NP   Please contact Palliative Medicine Team phone at [phone] for questions and concerns.  For individual provider: See Shea Evans

## 2019-05-16 NOTE — Procedures (Signed)
ELECTROENCEPHALOGRAM REPORT   Patient: Barbara [name]       Room #: 213A-AA EEG No. ID: [id number] Age: 78 y.o.        Sex: female Referring Physician: Posey Pronto Report Date:  05/16/2019        Interpreting Physician: Alexis Goodell  History: Barbara [name] is an 78 y.o. female with altered mental status  Medications:  Unasyn, Catapres, Epogen  Conditions of Recording:  This is a 21 channel routine scalp EEG performed with bipolar and monopolar montages arranged in accordance to the international 10/20 system of electrode placement. One channel was dedicated to EKG recording.  The patient is in the altered state.  Description:  The background activity is slow and poorly organized. The dominant background rhythm is a mixture of low voltage theta and delta activity that is diffusely distributed.  Some faster rhythms are noted on occasion.  Also noted are frequent intermittent periodic discharges of triphasic morphology.  These are seen bilaterally.   Hyperventilation was not performed.  Intermittent photic stimulation was performed but failed to illicit any change in the tracing.     IMPRESSION: This is an abnormal electroencephalogram due to general background slowing and the presence of triphasic waves.  These are seen most commonly in encephalopathic states.  Although most often seen with hepatic encephalopathies, it is not isolated to this clinical scenario.   Alexis Goodell, MD Neurology [phone] 05/16/2019, 3:07 PM

## 2019-05-16 NOTE — TOC Initial Note (Signed)
Transition of Care [facility]) - Initial/Assessment Note    Patient Details  Name: Barbara [name] MRN: [id number] Date of Birth: May 06, 1941  [facility]) CM/SW Contact:    [name], RN Phone Number: 05/16/2019, 9:36 AM  Clinical Narrative:                 Patient admitted from home wit PNA  Patient lives at home with husband.  Patient is non verbal at this time.  Husband at bedside.  Patient states that patient went to HD last Wednesday and was "normal".  States normally she "talks all the time".  States when she woke up on Thursday she was only able to say hey a couple of times, and has not spoken since   Husband states patient uses [facility] for transportation to HD and appointments.    PCP [place] raven.  Denies issues obtaining medications  Patient has had [facility] in the past  Palliative consult pending  Husband states that patient has RW, WC< cane, Chronic O2 through [facility], and CPAP  Expected Discharge Plan: [place]     Patient Goals and CMS Choice        Expected Discharge Plan and Services Expected Discharge Plan: [place]       Living arrangements for the past 2 months: Single Family Home                                      Prior Living Arrangements/Services Living arrangements for the past 2 months: Single Family Home Lives with:: Spouse Patient language and need for interpreter reviewed:: Yes Do you feel safe going back to the place where you live?: Yes      Need for Family Participation in Patient Care: Yes (Comment) Care giver support system in place?: Yes (comment) Current home services: DME Criminal Activity/Legal Involvement Pertinent to Current Situation/Hospitalization: No - Comment as needed  Activities of Daily Living Home Assistive Devices/Equipment: Other (Comment)(unknown) ADL Screening (condition at time of admission) Patient's cognitive ability  adequate to safely complete daily activities?: No Is the patient deaf or have difficulty hearing?: No Does the patient have difficulty seeing, even when wearing glasses/contacts?: No Does the patient have difficulty concentrating, remembering, or making decisions?: Yes Patient able to express need for assistance with ADLs?: No Does the patient have difficulty dressing or bathing?: Yes Independently performs ADLs?: No Does the patient have difficulty walking or climbing stairs?: Yes Weakness of Legs: Both Weakness of Arms/Hands: None  Permission Sought/Granted                  Emotional Assessment Appearance:: Appears stated age     Orientation: : (Non verbal at this time)   Psych Involvement: No (comment)  Admission diagnosis:  Sepsis with acute hypoxic respiratory failure, due to unspecified organism, unspecified whether septic shock present ([place]) [A41.9, R65.20, J96.01] Patient Active Problem List   Diagnosis Date Noted  . PNA (pneumonia) 05/11/2019  . Dialysis patient ([place])   . Metabolic acidosis 40/97/3532  . Complication from renal dialysis device 01/28/2018  . History of colonic polyps 07/30/2017  . Presence of cardiac and vascular implant and graft   . Fever   . Sepsis ([place])   . End stage renal disease ([place]) 02/01/2017  . Sepsis secondary to UTI ([place]) 01/24/2017  . Acute kidney  injury ([place]) 11/19/2016  . Anemia, chronic renal failure, stage 4 (severe) ([place]) 09/17/2016  . Hypoglycemia 06/04/2016  . Hyperkalemia 05/12/2016  . Protein-calorie malnutrition, severe 05/05/2016  . Acute on chronic renal failure ([place]) 05/04/2016  . Seizure ([place]) 04/17/2016  . Palliative care by specialist   . DNR (do not resuscitate) discussion   . C. difficile diarrhea 03/11/2016  . Anemia 03/11/2016  . Hypotension 03/11/2016  . Acidosis 03/11/2016  . Failure to thrive (child) 03/11/2016  . Weakness generalized 03/11/2016  . ARF (acute renal failure) ([place]) 03/04/2016  . Cancer  of trigone of urinary bladder ([place]) 03/02/2016  . Absolute anemia 02/04/2016  . Airway hyperreactivity 02/04/2016  . Celiac disease 02/04/2016  . Gastric catarrh 02/04/2016  . Acid reflux 02/04/2016  . Combined fat and carbohydrate induced hyperlipemia 02/04/2016  . C. difficile colitis 01/22/2016  . Urinary obstruction 01/21/2016  . COPD (chronic obstructive pulmonary disease) ([place]) 01/21/2016  . Controlled type 2 diabetes mellitus with stage 4 chronic kidney disease, with long-term current use of insulin ([place]) 01/21/2016  . Essential hypertension 01/21/2016  . Acute renal failure superimposed on stage 4 chronic kidney disease ([place]) 01/20/2016  . Anemia in chronic kidney disease 01/20/2016  . Arthritis of knee, degenerative 05/10/2015  . Knee strain 03/26/2015  . Other intervertebral disc displacement, lumbar region 03/04/2015  . Degenerative arthritis of lumbar spine 03/04/2015  . HNP (herniated nucleus pulposus), lumbar 03/04/2015  . Osteoarthritis of spine with radiculopathy, lumbar region 03/04/2015  . Injury of tendon of upper extremity 11/12/2014  . Atherosclerosis of abdominal aorta ([place]) 11/02/2014  . Chronic kidney disease, stage IV (severe) ([place]) 11/02/2014  . Obstructive apnea 11/02/2014  . Complete rotator cuff rupture of left shoulder 10/05/2014  . Arthritis of shoulder region, degenerative 10/05/2014  . CAD in native artery 12/08/2013  . Benign essential HTN 12/08/2013  . Type 2 diabetes mellitus ([place]) 12/08/2013   PCP:  [name], MD Pharmacy:   [id number] [place] [place] [address] [id number]: [phone]: [phone]    Social Determinants of Health (SDOH) Interventions    Readmission Risk Interventions No flowsheet data found.

## 2019-05-16 NOTE — Consult Note (Signed)
[facility] Face-to-Face Psychiatry Consult   Reason for Consult: Rule out depression Referring Physician: Dustin Flock Patient Identification: Barbara [name] MRN:  [id number] Principal Diagnosis: <principal problem not specified> Diagnosis:  Active Problems:   PNA (pneumonia)   Total Time spent with patient: 20 minutes  Subjective:   Barbara [name] is a 78 y.o. female patient who was admitted with altered mental status and found to have infection.  HPI:   Patient admitted with altered mental status and found to have infections.  Psychiatry was consulted to rule out depression as a possible factor in her current presentation.  Neuro work-up thus far has been inconclusive and team felt that patient might be depressed contributing to her lack of speech or movement.  Unfortunately patient was awake but not alert enough to participate in a psychiatric evaluation.  Patient did not say any words for Probation officer.  Patient did not make any purposeful movements for writer.  Patient did blink her eyes and overt her gaze when writer attempted to elicit a pupillary response with his flashlight.  No other movements or signs of communication.    Past Psychiatric History: Per chart review no history of psychiatric issues  Risk to Self:  No in current state Risk to Others:  Now in current state Prior Inpatient Therapy:  Unknown Prior Outpatient Therapy:  Unknown  Past Medical History:  Past Medical History:  Diagnosis Date  . Adult celiac disease   . Anemia associated with chronic renal failure 2017   blood transfusion last week 10/17  . Arthritis   . Asthma   . 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 Chronic kidney disease   . 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  dr. Candiss Norse and dr. Holley Raring follow her  . COPD (chronic obstructive pulmonary disease) ([place])   . Coronary artery disease   . Diabetes mellitus without complication ([place])   . Dialysis patient ([place])    Tues, Thurs, Sat  . Dyspnea     with exertion  . Elevated lipids   . GERD (gastroesophageal reflux disease)   . Hematuria   . Hypertension   . Lower back pain   . Neuropathy   . Oxygen dependent    at hs  . Personal history of chemotherapy 2017   bladder ca  . Personal history of radiation therapy 2017   bladder ca  . Sleep apnea    uses cpap  . Urinary obstruction 01/2016    Past Surgical History:  Procedure Laterality Date  . A/V SHUNT INTERVENTION N/A 05/12/2018   Procedure: A/V SHUNT INTERVENTION;  Surgeon: Algernon Huxley, MD;  Location: [place] [facility];  Service: Cardiovascular;  Laterality: N/A;  . A/V SHUNT INTERVENTION Left 07/29/2018   Procedure: A/V SHUNT INTERVENTION;  Surgeon: Katha Cabal, MD;  Location: [place] [facility];  Service: Cardiovascular;  Laterality: Left;  . ABDOMINAL HYSTERECTOMY    . AV FISTULA PLACEMENT Left 02/17/2017   Procedure: INSERTION OF ARTERIOVENOUS (AV) GORE-TEX GRAFT ARM(BRACHIAL AXILLARY);  Surgeon: Katha Cabal, MD;  Location: [facility];  Service: Vascular;  Laterality: Left;  . CYSTOSCOPY W/ RETROGRADES Bilateral 02/17/2016   Procedure: CYSTOSCOPY WITH RETROGRADE PYELOGRAM;  Surgeon: Hollice Espy, MD;  Location: [facility];  Service: Urology;  Laterality: Bilateral;  . CYSTOSCOPY W/ RETROGRADES Bilateral 10/12/2016   Procedure: CYSTOSCOPY WITH RETROGRADE PYELOGRAM;  Surgeon: Hollice Espy, MD;  Location: [facility];  Service: Urology;  Laterality: Bilateral;  . CYSTOSCOPY W/  RETROGRADES Bilateral 06/09/2017   Procedure: CYSTOSCOPY WITH RETROGRADE PYELOGRAM;  Surgeon: Hollice Espy, MD;  Location: [facility];  Service: Urology;  Laterality: Bilateral;  . CYSTOSCOPY W/ RETROGRADES Bilateral 10/07/2017   Procedure: CYSTOSCOPY WITH RETROGRADE PYELOGRAM;  Surgeon: Hollice Espy, MD;  Location: [facility];  Service: Urology;  Laterality: Bilateral;  . CYSTOSCOPY W/ URETERAL STENT PLACEMENT Left 05/12/2016   Procedure: CYSTOSCOPY WITH STENT REPLACEMENT;  Surgeon:  Hollice Espy, MD;  Location: [facility];  Service: Urology;  Laterality: Left;  . CYSTOSCOPY W/ URETERAL STENT PLACEMENT Left 10/12/2016   Procedure: CYSTOSCOPY WITH STENT REPLACEMENT;  Surgeon: Hollice Espy, MD;  Location: [facility];  Service: Urology;  Laterality: Left;  . CYSTOSCOPY WITH BIOPSY N/A 06/09/2017   Procedure: CYSTOSCOPY WITH BLADDER BIOPSY;  Surgeon: Hollice Espy, MD;  Location: [facility];  Service: Urology;  Laterality: N/A;  . CYSTOSCOPY WITH STENT PLACEMENT Left 01/21/2016   Procedure: CYSTOSCOPY WITH double J STENT PLACEMENT;  Surgeon: Franchot Gallo, MD;  Location: [facility];  Service: Urology;  Laterality: Left;  . CYSTOSCOPY WITH STENT PLACEMENT Right 10/12/2016   Procedure: CYSTOSCOPY WITH STENT PLACEMENT;  Surgeon: Hollice Espy, MD;  Location: [facility];  Service: Urology;  Laterality: Right;  . DIALYSIS/PERMA CATHETER INSERTION N/A 11/20/2016   Procedure: Dialysis/Perma Catheter Insertion;  Surgeon: Algernon Huxley, MD;  Location: [place] [facility];  Service: Cardiovascular;  Laterality: N/A;  . DIALYSIS/PERMA CATHETER REMOVAL N/A 03/30/2017   Procedure: DIALYSIS/PERMA CATHETER REMOVAL;  Surgeon: Katha Cabal, MD;  Location: [place] [facility];  Service: Cardiovascular;  Laterality: N/A;  . KIDNEY SURGERY  01/21/2016   IR NEPHROSTOMY PLACEMENT LEFT   . PERIPHERAL VASCULAR CATHETERIZATION N/A 04/07/2016   Procedure: Glori Luis Cath Insertion;  Surgeon: Katha Cabal, MD;  Location: [place] [facility];  Service: Cardiovascular;  Laterality: N/A;  . PERIPHERAL VASCULAR THROMBECTOMY Left 06/02/2017   Procedure: PERIPHERAL VASCULAR THROMBECTOMY;  Surgeon: Algernon Huxley, MD;  Location: [place] [facility];  Service: Cardiovascular;  Laterality: Left;  . PERIPHERAL VASCULAR THROMBECTOMY Left 01/28/2018   Procedure: PERIPHERAL VASCULAR THROMBECTOMY;  Surgeon: Katha Cabal, MD;  Location: [place] [facility];  Service: Cardiovascular;  Laterality: Left;  .  PERIPHERAL VASCULAR THROMBECTOMY Left 03/14/2018   Procedure: PERIPHERAL VASCULAR THROMBECTOMY;  Surgeon: Algernon Huxley, MD;  Location: [place] [facility];  Service: Cardiovascular;  Laterality: Left;  . PERIPHERAL VASCULAR THROMBECTOMY Left 03/16/2018   Procedure: PERIPHERAL VASCULAR THROMBECTOMY;  Surgeon: Katha Cabal, MD;  Location: [place] [facility];  Service: Cardiovascular;  Laterality: Left;  . PORTA CATH REMOVAL N/A 02/07/2019   Procedure: PORTA CATH REMOVAL;  Surgeon: Katha Cabal, MD;  Location: [place] [facility];  Service: Cardiovascular;  Laterality: N/A;  . PORTACATH PLACEMENT Right   . ROTATOR CUFF REPAIR Left   . TEMPORARY DIALYSIS CATHETER N/A 05/11/2018   Procedure: TEMPORARY DIALYSIS CATHETER;  Surgeon: Katha Cabal, MD;  Location: [place] [facility];  Service: Cardiovascular;  Laterality: N/A;  . TEMPORARY DIALYSIS CATHETER Left 07/27/2018   Procedure: TEMPORARY DIALYSIS CATHETER;  Surgeon: Katha Cabal, MD;  Location: [place] [facility];  Service: Cardiovascular;  Laterality: Left;  . TRANSURETHRAL RESECTION OF BLADDER TUMOR N/A 02/17/2016   Procedure: TRANSURETHRAL RESECTION OF BLADDER TUMOR (TURBT)-LARGE;  Surgeon: Hollice Espy, MD;  Location: [facility];  Service: Urology;  Laterality: N/A;  . TRANSURETHRAL RESECTION OF BLADDER TUMOR N/A 10/07/2017   Procedure: TRANSURETHRAL RESECTION OF BLADDER TUMOR (TURBT)-small;  Surgeon: Hollice Espy,  MD;  Location: [facility];  Service: Urology;  Laterality: N/A;  . URETEROSCOPY Left 02/17/2016   Procedure: URETEROSCOPY;  Surgeon: Hollice Espy, MD;  Location: [facility];  Service: Urology;  Laterality: Left;  . URETEROSCOPY Right 10/12/2016   Procedure: URETEROSCOPY;  Surgeon: Hollice Espy, MD;  Location: [facility];  Service: Urology;  Laterality: Right;   Family History:  Family History  Problem Relation Age of Onset  . Diabetes Mother   . Cancer Father   . Breast cancer Neg Hx   . Kidney disease  Neg Hx    Family Psychiatric  History: Unknown Social History:  Social History   Substance and Sexual Activity  Alcohol Use No     Social History   Substance and Sexual Activity  Drug Use No    Social History   Socioeconomic History  . Marital status: Married    Spouse name: Not on file  . Number of children: Not on file  . Years of education: Not on file  . Highest education level: Not on file  Occupational History  . Occupation: retired  Scientific laboratory technician  . Financial resource strain: Not on file  . Food insecurity    Worry: Not on file    Inability: Not on file  . Transportation needs    Medical: Not on file    Non-medical: Not on file  Tobacco Use  . Smoking status: Former Smoker    Types: Cigarettes    Quit date: 07/20/2002    Years since quitting: 16.8  . Smokeless tobacco: Never Used  . Tobacco comment: 01/22/2016   "  quit smoking many years ago "  Substance and Sexual Activity  . Alcohol use: No  . Drug use: No  . Sexual activity: Never  Lifestyle  . Physical activity    Days per week: Not on file    Minutes per session: Not on file  . Stress: Not on file  Relationships  . Social Herbalist on phone: Not on file    Gets together: Not on file    Attends religious service: Not on file    Active member of club or organization: Not on file    Attends meetings of clubs or organizations: Not on file    Relationship status: Not on file  Other Topics Concern  . Not on file  Social History Narrative  . Not on file   Additional Social History: Unknown    Allergies:   Allergies  Allergen Reactions  . Glipizide Er Other (See Comments)    Hypoglycemia   . Percocet [Oxycodone-Acetaminophen] Hives    Labs:  Results for orders placed or performed during the hospital encounter of 05/11/19 (from the past 48 hour(s))  Glucose, capillary     Status: None   Collection Time: 05/15/19  8:22 PM  Result Value Ref Range   Glucose-Capillary 99 70 - 99  mg/dL  Basic metabolic panel     Status: Abnormal   Collection Time: 05/15/19  9:23 PM  Result Value Ref Range   Sodium 142 135 - 145 mmol/L   Potassium 4.4 3.5 - 5.1 mmol/L   Chloride 100 98 - 111 mmol/L   CO2 24 22 - 32 mmol/L   Glucose, Bld 99 70 - 99 mg/dL   BUN 58 (H) 8 - 23 mg/dL   Creatinine, Ser 8.84 (H) 0.44 - 1.00 mg/dL   Calcium 9.3 8.9 - 10.3 mg/dL   GFR calc non Af Amer 4 (  L) >60 mL/min   GFR calc Af Amer 4 (L) >60 mL/min   Anion gap 18 (H) 5 - 15    Comment: Performed at [facility], [address] [place], [place] [address]  Magnesium     Status: None   Collection Time: 05/15/19  9:23 PM  Result Value Ref Range   Magnesium 2.1 1.7 - 2.4 mg/dL    Comment: Performed at [facility], [address] [place], [place] [address]    Current Facility-Administered Medications  Medication Dose Route Frequency Provider Last Rate Last Dose  . 0.9 %  sodium chloride infusion   Intravenous PRN Dustin Flock, MD   Stopped at 05/15/19 2045  . acetaminophen (TYLENOL) tablet 650 mg  650 mg Oral Q4H PRN Dustin Flock, MD       Or  . acetaminophen (TYLENOL) suppository 650 mg  650 mg Rectal Q4H PRN Dustin Flock, MD   650 mg at 05/12/19 9211  . albuterol (PROVENTIL) (2.5 MG/3ML) 0.083% nebulizer solution 2.5 mg  2.5 mg Inhalation Q6H PRN Dustin Flock, MD      . Ampicillin-Sulbactam (UNASYN) 3 g in sodium chloride 0.9 % 100 mL IVPB  3 g Intravenous Q12H Tawnya Crook, RPH   Stopped at 05/15/19 2045  . chlorhexidine (PERIDEX) 0.12 % solution 15 mL  15 mL Mouth/Throat Q4H Lang Snow, NP   15 mL at 05/16/19 0535  . Chlorhexidine Gluconate Cloth 2 % PADS 6 each  6 each Topical Daily Dustin Flock, MD   6 each at 05/15/19 1823  . cloNIDine (CATAPRES - Dosed in mg/24 hr) patch 0.2 mg  0.2 mg Transdermal Weekly Dustin Flock, MD   0.2 mg at 05/14/19 9417  . epoetin alfa (EPOGEN) injection 2,000 Units  2,000 Units Intravenous Once Bhutani,  Manpreet S, MD      . heparin injection 5,000 Units  5,000 Units Subcutaneous Q8H Dustin Flock, MD   5,000 Units at 05/16/19 1448  . influenza vaccine adjuvanted (FLUAD) injection 0.5 mL  0.5 mL Intramuscular Tomorrow-1000 Dustin Flock, MD      . iohexol (OMNIPAQUE) 300 MG/ML solution 100 mL  100 mL Intravenous Once PRN Merlyn Lot, MD      . lidocaine (PF) (XYLOCAINE) 1 % injection 5 mL  5 mL Intradermal Once Merlyn Lot, MD      . mupirocin ointment (BACTROBAN) 2 % 1 application  1 application Nasal BID Dustin Flock, MD   1 application at 40/81/44 2211  . pantoprazole (PROTONIX) injection 40 mg  40 mg Intravenous Q12H Dustin Flock, MD   40 mg at 05/16/19 1237  . sodium chloride flush (NS) 0.9 % injection 3 mL  3 mL Intravenous Q12H Dustin Flock, MD   3 mL at 05/16/19 1239    Musculoskeletal: Strength & Muscle Tone: decreased Gait & Station: unable to stand Patient leans: N/A  Psychiatric Specialty Exam: Physical Exam  ROS unable to complete  Blood pressure (!) 121/48, pulse 76, temperature 98.6 F (37 C), temperature source Oral, resp. rate 18, height 5' 8"  (1.727 m), weight 76.1 kg, SpO2 99 %.Body mass index is 25.51 kg/m.  General Appearance: Laying in bed both eyes open,   Eye Contact: Disconjugate gaze  Speech:  Negative  Volume:  NA  Mood:  NA  Affect:  NA  Thought Process:  NA  Orientation:  NA  Thought Content:  NA  Suicidal Thoughts:    Homicidal Thoughts:    Memory:  NA  Judgement:  NA  Insight:  NA  Psychomotor Activity:  NA  Concentration:  Concentration: Negative  Recall:  NA  Fund of Knowledge:  NA  Language:  NA  Akathisia:  NA  Handed:  Right  AIMS (if indicated):     Assets:   ADL's:  Intact  Cognition:  WNL  Sleep:        Assessment: 78 year old female with no documented psychiatric history who presents with altered mental status found to have infections.  There might be a component of depression contributing to the  patient's picture however with no evidence or history of severe depression it is unlikely that it is contributing to the patient's current clinical status.  Recommendations: Continue to search and treat for causes of infection.  Continue to search for cause and treat causes of encephalopathy.  Disposition: No evidence of imminent risk to self or others at present.    Dixie Dials, MD 05/16/2019 4:43 PM

## 2019-05-17 LAB — RENAL FUNCTION PANEL
Albumin: 3.1 g/dL — ABNORMAL LOW (ref 3.5–5.0)
Anion gap: 22 — ABNORMAL HIGH (ref 5–15)
BUN: 107 mg/dL — ABNORMAL HIGH (ref 8–23)
CO2: 23 mmol/L (ref 22–32)
Calcium: 9.1 mg/dL (ref 8.9–10.3)
Chloride: 101 mmol/L (ref 98–111)
Creatinine, Ser: 12.97 mg/dL — ABNORMAL HIGH (ref 0.44–1.00)
GFR calc Af Amer: 3 mL/min — ABNORMAL LOW (ref >60)
GFR calc non Af Amer: 2 mL/min — ABNORMAL LOW (ref >60)
Glucose, Bld: 101 mg/dL — ABNORMAL HIGH (ref 70–99)
Phosphorus: 7.6 mg/dL — ABNORMAL HIGH (ref 2.5–4.6)
Potassium: 4.7 mmol/L (ref 3.5–5.1)
Sodium: 146 mmol/L — ABNORMAL HIGH (ref 135–145)

## 2019-05-17 LAB — CBC
HCT: 33.6 % — ABNORMAL LOW (ref 36.0–46.0)
Hemoglobin: 10.1 g/dL — ABNORMAL LOW (ref 12.0–15.0)
MCH: 30.2 pg (ref 26.0–34.0)
MCHC: 30.1 g/dL (ref 30.0–36.0)
MCV: 100.6 fL — ABNORMAL HIGH (ref 80.0–100.0)
Platelets: 282 10*3/uL (ref 150–400)
RBC: 3.34 MIL/uL — ABNORMAL LOW (ref 3.87–5.11)
RDW: 13.9 % (ref 11.5–15.5)
WBC: 9.1 10*3/uL (ref 4.0–10.5)
nRBC: 0 % (ref 0.0–0.2)

## 2019-05-17 NOTE — Progress Notes (Signed)
SLP Cancellation Note  Patient Details Name: Barbara [name] MRN: [id number] DOB: 1941/03/30   Cancelled treatment:       Reason Eval/Treat Not Completed: Patient at procedure or test/unavailable(will f/u this PM)    [name], [place], CCC-SLP Carder Yin 05/17/2019, 10:14 AM

## 2019-05-17 NOTE — Plan of Care (Signed)
  Problem: Clinical Measurements: Goal: Ability to maintain clinical measurements within normal limits will improve Outcome: Progressing Goal: Will remain free from infection Outcome: Progressing Goal: Diagnostic test results will improve Outcome: Progressing Goal: Respiratory complications will improve Outcome: Progressing Goal: Cardiovascular complication will be avoided Outcome: Progressing  Pt arrived from her room to receive Routine HD Tx. Pt roprts no SOB of breath or chest pain, however, Pt was feeling emotional and started to cry wanting to see her sister. Report was given to primary RN for possible psychiatric consult. Pt vitals are WDL to start HD Tx.

## 2019-05-17 NOTE — Progress Notes (Signed)
SLP Cancellation Note  Patient Details Name: Barbara [name] MRN: [id number] DOB: [birth year]   Cancelled treatment:       Reason Eval/Treat Not Completed: Patient at procedure or test/unavailable(pt remains in HD. ST will f/u tomorrow. ) MD's note indicates continued confusion, Acute encephalopathy. Pt remains NPO w/ frequent oral care, aspiration precautions.      [name], MS, CCC-SLP Hershey Knauer 05/17/2019, 1:51 PM

## 2019-05-17 NOTE — Progress Notes (Signed)
Post HD Tx completion Note Pt tolerated well the Tx. VP was high during tx. Nephrologist assessed the pt 'access with [place]. BFR was reduced to 250 instead of 400. Pt Tx ran on 3K 2.5Ca and Na137 for 3.5 hrs. UF goal achived was 360m as Dr.Singh ordered to reduce UF weight since the pt had Tx yesterday and she is NPO   05/17/19 1350  Hand-Off documentation  Report given to (Full Name) JDub MikesRN  Report received from (Full Name) DNewt MinionRN   Vital Signs  Temp 98.5 F (36.9 C)  Temp Source Oral  Pulse Rate 82  Pulse Rate Source Monitor  Resp 19  BP (!) 118/54  BP Location Right Arm  BP Method Automatic  Patient Position (if appropriate) Lying  Oxygen Therapy  SpO2 100 %  O2 Device Nasal Cannula  O2 Flow Rate (L/min) 2 L/min  Pain Assessment  Pain Scale 0-10  Pain Score 0  Fistula / Graft Left Upper arm Arteriovenous vein graft  Placement Date/Time: 02/17/17 1111   Orientation: Left  Access Location: Upper arm  Access Type: Arteriovenous vein graft  Site Condition No complications  Fistula / Graft Assessment Thrill;Bruit  Status Deaccessed  Drainage Description None

## 2019-05-17 NOTE — Progress Notes (Signed)
[place], is a 78 y.o. female, DOB - 01-May-1941, EXB:[id number]  Admit date - 05/11/2019   Admitting Physician Dustin Flock, MD  Outpatient Primary MD for the patient is [name], MD   LOS - 6  Subjective:  Awake and mumbling but unable to interpret period Confused  Review of Systems:   :unable to provide  Vitals:   Vitals:   05/17/19 1100 05/17/19 1115 05/17/19 1130 05/17/19 1145  BP: (!) 119/58 (!) 113/48 (!) 102/50 (!) 119/51  Pulse: 76 81 89 85  Resp: 18 20 18 14   Temp:      TempSrc:      SpO2: 100% 100% 98% 100%  Weight:      Height:        Wt Readings from Last 3 Encounters:  05/17/19 74.3 kg  02/28/19 81.6 kg  02/07/19 81.6 kg     Intake/Output Summary (Last 24 hours) at 05/17/2019 1211 Last data filed at 05/17/2019 0800 Gross per 24 hour  Intake 208.82 ml  Output -  Net 208.82 ml    Physical Exam:   GENERAL: obsese chronically ill appearing  HEAD, EYES, EARS, NOSE AND THROAT: Atraumatic, normocephalic. Extraocular muscles are intact. Pupils equal and reactive to light. Sclerae anicteric. No conjunctival injection. No oro-pharyngeal erythema.  NECK: Supple. There is no jugular venous distention. No bruits, no lymphadenopathy, no thyromegaly.  HEART: Regular rate and rhythm,. No murmurs, no rubs, no clicks.  LUNGS: Clear to auscultation bilaterally. No rales or rhonchi. No wheezes.  ABDOMEN: Soft, flat, nontender, nondistended. Has good bowel sounds. No hepatosplenomegaly appreciated.  EXTREMITIES: No evidence of any cyanosis, clubbing, or peripheral edema.  +2 pedal and radial pulses bilaterally.  NEUROLOGIC: Patient awake but not communicating not following commands SKIN: Moist and warm with  no rashes appreciated.  Psych: Not anxious, depressed LN: No inguinal LN enlargement    Antibiotics   Anti-infectives (From admission, onward)   Start     Dose/Rate Route Frequency Ordered Stop   05/15/19 1800  Ampicillin-Sulbactam (UNASYN) 3 g in sodium chloride 0.9 % 100 mL IVPB     3 g 200 mL/hr over 30 Minutes Intravenous Every 12 hours 05/15/19 1343     05/15/19 1200  vancomycin (VANCOCIN) IVPB 1000 mg/200 mL premix  Status:  Discontinued     1,000 mg 200 mL/hr over 60 Minutes Intravenous Every M-W-F (Hemodialysis) 05/12/19 1420 05/12/19  1917   05/14/19 2330  metroNIDAZOLE (FLAGYL) IVPB 500 mg  Status:  Discontinued     500 mg 100 mL/hr over 60 Minutes Intravenous Every 8 hours 05/14/19 2318 05/15/19 1342   05/12/19 1800  ceFEPIme (MAXIPIME) 1 g in sodium chloride 0.9 % 100 mL IVPB  Status:  Discontinued     1 g 200 mL/hr over 30 Minutes Intravenous Every 24 hours 05/12/19 1420 05/15/19 1342   05/12/19 1600  vancomycin (VANCOCIN) IVPB 1000 mg/200 mL premix     1,000 mg 200 mL/hr over 60 Minutes Intravenous  Once 05/12/19 1420 05/12/19 1656   05/11/19 1530  cefTRIAXone (ROCEPHIN) 2 g in sodium chloride 0.9 % 100 mL IVPB  Status:  Discontinued     2 g 200 mL/hr over 30 Minutes Intravenous Every 24 hours 05/11/19 1520 05/12/19 0759   05/11/19 1530  azithromycin (ZITHROMAX) 500 mg in sodium chloride 0.9 % 250 mL IVPB  Status:  Discontinued     500 mg 250 mL/hr over 60 Minutes Intravenous Every 24 hours 05/11/19 1520 05/12/19 0759   05/11/19 1300  vancomycin (VANCOCIN) 500 mg in sodium chloride 0.9 % 100 mL IVPB     500 mg 100 mL/hr over 60 Minutes Intravenous  Once 05/11/19 1116 05/11/19 1539   05/11/19 1100  vancomycin (VANCOCIN) IVPB 1000 mg/200 mL premix     1,000 mg 200 mL/hr over 60 Minutes Intravenous  Once 05/11/19 1054 05/11/19 1534   05/11/19 1100  ceFEPIme (MAXIPIME) 2 g in sodium chloride 0.9 % 100 mL IVPB     2 g 200 mL/hr over 30 Minutes Intravenous  Once 05/11/19  1054 05/11/19 1219      Medications   Scheduled Meds: . chlorhexidine  15 mL Mouth/Throat Q4H  . Chlorhexidine Gluconate Cloth  6 each Topical Daily  . cloNIDine  0.2 mg Transdermal Weekly  . epoetin (EPOGEN/PROCRIT) injection  2,000 Units Intravenous Once  . heparin  5,000 Units Subcutaneous Q8H  . influenza vaccine adjuvanted  0.5 mL Intramuscular Tomorrow-1000  . lidocaine (PF)  5 mL Intradermal Once  . mupirocin ointment  1 application Nasal BID  . pantoprazole (PROTONIX) IV  40 mg Intravenous Q12H  . sodium chloride flush  3 mL Intravenous Q12H   Continuous Infusions: . sodium chloride Stopped (05/15/19 2045)  . ampicillin-sulbactam (UNASYN) IV Stopped (05/17/19 0720)   PRN Meds:.sodium chloride, acetaminophen **OR** acetaminophen, albuterol, iohexol   Data Review:   Micro Results Recent Results (from the past 240 hour(s))  Blood Culture (routine x 2)     Status: Abnormal   Collection Time: 05/11/19 11:21 AM   Specimen: BLOOD  Result Value Ref Range Status   Specimen Description BLOOD RIGHT ANTECUBITAL  Final   Special Requests   Final    BOTTLES DRAWN AEROBIC AND ANAEROBIC Blood Culture adequate volume   Culture  Setup Time   Final    GRAM NEGATIVE RODS ANAEROBIC BOTTLE ONLY CRITICAL RESULT CALLED TO, READ BACK BY AND VERIFIED WITH: JASON ROBBINS AT 1644 05/12/2019 KMP    Culture BACTEROIDES FRAGILIS BETA LACTAMASE POSITIVE  (A)  Final   Report Status 05/14/2019 FINAL  Final  Urine culture     Status: Abnormal   Collection Time: 05/11/19 11:21 AM   Specimen: Urine, Random  Result Value Ref Range Status   Specimen Description   Final    URINE, RANDOM Performed at [facility], [address], [place], [place] [address]    Special Requests   Final  NONE Performed at [facility], [address] [place], [place] [address]    Culture >=100,000 COLONIES/mL ESCHERICHIA COLI (A)  Final   Report Status 05/13/2019 FINAL  Final   Organism  ID, Bacteria ESCHERICHIA COLI (A)  Final      Susceptibility   Escherichia coli - MIC*    AMPICILLIN <=2 SENSITIVE Sensitive     CEFAZOLIN <=4 SENSITIVE Sensitive     CEFTRIAXONE <=1 SENSITIVE Sensitive     CIPROFLOXACIN <=0.25 SENSITIVE Sensitive     GENTAMICIN <=1 SENSITIVE Sensitive     IMIPENEM <=0.25 SENSITIVE Sensitive     NITROFURANTOIN <=16 SENSITIVE Sensitive     TRIMETH/SULFA <=20 SENSITIVE Sensitive     AMPICILLIN/SULBACTAM <=2 SENSITIVE Sensitive     PIP/TAZO <=4 SENSITIVE Sensitive     Extended ESBL NEGATIVE Sensitive     * >=100,000 COLONIES/mL ESCHERICHIA COLI  Blood Culture ID Panel (Reflexed)     Status: None   Collection Time: 05/11/19 11:21 AM  Result Value Ref Range Status   Enterococcus species NOT DETECTED NOT DETECTED Final   Listeria monocytogenes NOT DETECTED NOT DETECTED Final   Staphylococcus species NOT DETECTED NOT DETECTED Final   Staphylococcus aureus (BCID) NOT DETECTED NOT DETECTED Final   Streptococcus species NOT DETECTED NOT DETECTED Final   Streptococcus agalactiae NOT DETECTED NOT DETECTED Final   Streptococcus pneumoniae NOT DETECTED NOT DETECTED Final   Streptococcus pyogenes NOT DETECTED NOT DETECTED Final   Acinetobacter baumannii NOT DETECTED NOT DETECTED Final   Enterobacteriaceae species NOT DETECTED NOT DETECTED Final   Enterobacter cloacae complex NOT DETECTED NOT DETECTED Final   Escherichia coli NOT DETECTED NOT DETECTED Final   Klebsiella oxytoca NOT DETECTED NOT DETECTED Final   Klebsiella pneumoniae NOT DETECTED NOT DETECTED Final   Proteus species NOT DETECTED NOT DETECTED Final   Serratia marcescens NOT DETECTED NOT DETECTED Final   Haemophilus influenzae NOT DETECTED NOT DETECTED Final   Neisseria meningitidis NOT DETECTED NOT DETECTED Final   Pseudomonas aeruginosa NOT DETECTED NOT DETECTED Final   Candida albicans NOT DETECTED NOT DETECTED Final   Candida glabrata NOT DETECTED NOT DETECTED Final   Candida krusei NOT  DETECTED NOT DETECTED Final   Candida parapsilosis NOT DETECTED NOT DETECTED Final   Candida tropicalis NOT DETECTED NOT DETECTED Final    Comment: Performed at [facility], [address] [place],  [address]  SARS Coronavirus 2 by RT PCR (hospital order, performed in [place] hospital lab) Nasopharyngeal Nasopharyngeal Swab     Status: None   Collection Time: 05/11/19 12:01 PM   Specimen: Nasopharyngeal Swab  Result Value Ref Range Status   SARS Coronavirus 2 NEGATIVE NEGATIVE Final    Comment: (NOTE) If result is NEGATIVE SARS-CoV-2 target nucleic acids are NOT DETECTED. The SARS-CoV-2 RNA is generally detectable in upper and lower  respiratory specimens during the acute phase of infection. The lowest  concentration of SARS-CoV-2 viral copies this assay can detect is 250  copies / mL. A negative result does not preclude SARS-CoV-2 infection  and should not be used as the sole basis for treatment or other  patient management decisions.  A negative result may occur with  improper specimen collection / handling, submission of specimen other  than nasopharyngeal swab, presence of viral mutation(s) within the  areas targeted by this assay, and inadequate number of viral copies  (<250 copies / mL). A negative result must be combined with clinical  observations, patient history, and epidemiological information. If  result is POSITIVE SARS-CoV-2 target nucleic acids are DETECTED. The SARS-CoV-2 RNA is generally detectable in upper and lower  respiratory specimens dur ing the acute phase of infection.  Positive  results are indicative of active infection with SARS-CoV-2.  Clinical  correlation with patient history and other diagnostic information is  necessary to determine patient infection status.  Positive results do  not rule out bacterial infection or co-infection with other viruses. If result is PRESUMPTIVE POSTIVE SARS-CoV-2 nucleic acids MAY BE PRESENT.   A  presumptive positive result was obtained on the submitted specimen  and confirmed on repeat testing.  While 2019 novel coronavirus  (SARS-CoV-2) nucleic acids may be present in the submitted sample  additional confirmatory testing may be necessary for epidemiological  and / or clinical management purposes  to differentiate between  SARS-CoV-2 and other Sarbecovirus currently known to infect humans.  If clinically indicated additional testing with an alternate test  methodology [phone]) is advised. The SARS-CoV-2 RNA is generally  detectable in upper and lower respiratory sp ecimens during the acute  phase of infection. The expected result is Negative. Fact Sheet for Patients:  StrictlyIdeas.no Fact Sheet for Healthcare Providers: BankingDealers.co.za This test is not yet approved or cleared by the [place] FDA and has been authorized for detection and/or diagnosis of SARS-CoV-2 by FDA under an Emergency Use Authorization (EUA).  This EUA will remain in effect (meaning this test can be used) for the duration of the COVID-19 declaration under Section 564(b)(1) of the Act, 21 U.S.C. section 360bbb-3(b)(1), unless the authorization is terminated or revoked sooner. Performed at [facility], [address] [place], [place] [address]   Blood Culture (routine x 2)     Status: None   Collection Time: 05/11/19  9:59 PM   Specimen: BLOOD  Result Value Ref Range Status   Specimen Description BLOOD BLOOD RIGHT HAND  Final   Special Requests   Final    BOTTLES DRAWN AEROBIC AND ANAEROBIC Blood Culture adequate volume   Culture   Final    NO GROWTH 5 DAYS Performed at [facility], [address] [place], [place] [address]    Report Status 05/16/2019 FINAL  Final  MRSA PCR Screening     Status: Abnormal   Collection Time: 05/12/19  7:01 PM   Specimen: Nasal Mucosa; Nasopharyngeal  Result Value Ref Range Status   MRSA  by PCR POSITIVE (A) NEGATIVE Final    Comment:        The GeneXpert MRSA Assay (FDA approved for NASAL specimens only), is one component of a comprehensive MRSA colonization surveillance program. It is not intended to diagnose MRSA infection nor to guide or monitor treatment for MRSA infections. CRITICAL RESULT CALLED TO, READ BACK BY AND VERIFIED WITH: Jacqulynn Cadet AT 2103 05/12/2019 KMP Performed at [facility], [address] [place], [place] [address]     Radiology Reports Ct Abdomen Pelvis Wo Contrast  Result Date: 05/11/2019 CLINICAL DATA:  Abdominal pain, fever.  History of bladder cancer EXAM: CT ABDOMEN AND PELVIS WITHOUT CONTRAST TECHNIQUE: Multidetector CT imaging of the abdomen and pelvis was performed following the standard protocol without IV contrast. COMPARISON:  01/19/2018 FINDINGS: Lower chest: Small bilateral pleural effusions. Cardiomegaly. Bibasilar airspace opacities likely atelectasis. Hepatobiliary: No focal hepatic abnormality. Gallbladder unremarkable. Pancreas: No focal abnormality or ductal dilatation. Spleen: No focal abnormality.  Normal size. Adrenals/Urinary Tract: Severe bilateral hydroureteronephrosis, unchanged. Bladder wall is markedly thickened and stable since prior study. No visible  adrenal lesion. Stomach/Bowel: Stomach, large and small bowel grossly unremarkable. Vascular/Lymphatic: Heavily calcified aorta and iliac vessels. Right common iliac lymph node again noted with a short axis diameter of 1.5 cm, stable. Right inguinal lymph node has a short axis diameter of 7 mm, stable. Reproductive: Prior hysterectomy.  No adnexal masses. Other: No free fluid or free air. Musculoskeletal: No acute bony abnormality. IMPRESSION: Continued marked bladder wall thickening and severe bilateral hydronephrosis. Findings similar to prior study. Heavily calcified aorta and iliac vessels. New small bilateral pleural effusions. Bibasilar opacities, likely  atelectasis. Stable mildly enlarged and borderline sized right common iliac and right inguinal lymph nodes. Electronically Signed   By: Rolm Baptise M.D.   On: 05/11/2019 13:51   Ct Head Wo Contrast  Result Date: 05/12/2019 CLINICAL DATA:  Altered mental status. EXAM: CT HEAD WITHOUT CONTRAST TECHNIQUE: Contiguous axial images were obtained from the base of the skull through the vertex without intravenous contrast. COMPARISON:  Head CT 05/11/2017 FINDINGS: Brain: Stable age related cerebral atrophy, ventriculomegaly and periventricular white matter disease. No extra-axial fluid collections are identified. No CT findings for acute hemispheric infarction or intracranial hemorrhage. No mass lesions. The brainstem and cerebellum are normal. Vascular: Stable vascular calcifications. No aneurysm or hyperdense vessels. Skull: No skull fracture or bone lesions. Sinuses/Orbits: The paranasal sinuses and mastoid air cells are clear. The globes are intact. Other: No scalp lesions or hematoma. IMPRESSION: 1. Stable age related cerebral atrophy, ventriculomegaly and periventricular white matter disease. 2. No acute intracranial findings or mass lesions. Electronically Signed   By: Marijo Sanes M.D.   On: 05/12/2019 08:51   Mr Brain Wo Contrast  Result Date: 05/14/2019 CLINICAL DATA:  Altered mental status (AMS), unclear cause. EXAM: MRI HEAD WITHOUT CONTRAST TECHNIQUE: Multiplanar, multiecho pulse sequences of the brain and surrounding structures were obtained without intravenous contrast. COMPARISON:  Head CT examinations 05/12/2019 and earlier FINDINGS: Brain: Multiple sequences are significantly motion degraded, limiting evaluation. A fast brain protocol was utilized. There is no convincing evidence of acute infarct on mildly motion degraded diffusion weighted imaging. No evidence of intracranial mass. No midline shift or extra-axial fluid collection. No chronic intracranial blood products. There is a small chronic  white matter infarct within the anterior right frontal lobe which may subtly involve the overlying cortex. There is otherwise mild scattered and confluent T2/FLAIR hyperintensity within the cerebral white matter consistent with chronic small vessel ischemic disease. Small T2 hyperintense foci within the bilateral basal ganglia likely reflect a combination of prominent perivascular spaces and chronic lacunar infarcts. Mild generalized parenchymal atrophy. Vascular: Flow voids maintained within the proximal large arterial vessels. Skull and upper cervical spine: No focal marrow lesion identified on motion degraded imaging. Incompletely assessed cervical spondylosis. Sinuses/Orbits: Bilateral lens replacements. No significant paranasal sinus disease or mastoid effusion. IMPRESSION: 1. Motion degraded examination. 2. No evidence of acute intracranial abnormality. 3. Generalized parenchymal atrophy with chronic small vessel ischemic disease. 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 Electronically Signed   By: Kellie Simmering DO   On: 05/14/2019 17:23   Dg Chest Port 1 View  Result Date: 05/11/2019 CLINICAL DATA:  Sepsis EXAM: PORTABLE CHEST 1 VIEW COMPARISON:  05/10/2018 FINDINGS: Cardiac shadow is mildly prominent but accentuated by the portable technique. Further stenting in the left innominate vein and left subclavian vein is noted. Right chest wall port is been removed in the interval. Diffuse vascular congestion is noted with mild interstitial  edema. This may be related to a degree of volume overload related to the end-stage renal failure. No focal confluent infiltrate is seen. No sizable effusion is noted. No bony abnormality is noted. IMPRESSION: Vascular congestion likely related to the known renal failure and volume overload. No other focal abnormality is seen. Electronically Signed   By: Inez Catalina M.D.   On: 05/11/2019 12:11      CBC Recent Labs  Lab 05/11/19 1055 05/13/19 0650 05/17/19 0930  WBC 13.6* 13.2* 9.1  HGB 11.3* 10.9* 10.1*  HCT 36.3 35.3* 33.6*  PLT 199 180 282  MCV 98.9 98.9 100.6*  MCH 30.8 30.5 30.2  MCHC 31.1 30.9 30.1  RDW 13.4 13.4 13.9  LYMPHSABS 0.8  --   --   MONOABS 0.7  --   --   EOSABS 0.0  --   --   BASOSABS 0.0  --   --     Chemistries  Recent Labs  Lab 05/11/19 1055 05/13/19 0650 05/15/19 2123 05/17/19 0930  NA 137 139 142 146*  K 3.5 3.8 4.4 4.7  CL 99 98 100 101  CO2 25 23 24 23   GLUCOSE 109* 117* 99 101*  BUN 27* 33* 58* 107*  CREATININE 7.49* 7.39* 8.84* 12.97*  CALCIUM 8.6* 8.9 9.3 9.1  MG  --   --  2.1  --   AST 13*  --   --   --   ALT 8  --   --   --   ALKPHOS 89  --   --   --   BILITOT 0.8  --   --   --    ------------------------------------------------------------------------------------------------------------------ estimated creatinine clearance is 3.5 mL/min (A) (by C-G formula based on SCr of 12.97 mg/dL (H)). ------------------------------------------------------------------------------------------------------------------ No results for input(s): HGBA1C in the last 72 hours. ------------------------------------------------------------------------------------------------------------------ No results for input(s): CHOL, HDL, LDLCALC, TRIG, CHOLHDL, LDLDIRECT in the last 72 hours. ------------------------------------------------------------------------------------------------------------------ No results for input(s): TSH, T4TOTAL, T3FREE, THYROIDAB in the last 72 hours.  Invalid input(s): FREET3 ------------------------------------------------------------------------------------------------------------------ No results for input(s): VITAMINB12, FOLATE, FERRITIN, TIBC, IRON, RETICCTPCT in the last 72 hours.  Coagulation profile No results for input(s): INR, PROTIME in the last 168 hours.  No results for input(s): DDIMER in the last 72  hours.  Cardiac Enzymes No results for input(s): CKMB, TROPONINI, MYOGLOBIN in the last 168 hours.  Invalid input(s): CK ------------------------------------------------------------------------------------------------------------------ Invalid input(s): POCBNP    Assessment & Plan   IMPRESSION AND PLAN: Patient 78 year old presenting with abdominal pain confusion and hypoxia  *Bacteroids fragilis bacteremia With bladder cancer GU is likely source. On IV Unasyn.  *Community-acquired pneumonia.  Has been on IV antibiotics.  Improved  *E. coli UTI. On IV Unasyn  * Acute encephalopathy  No significant improvement Mri shows no acute abnormality  eeg pending  appreciate neurology and psychiatry input Some improvement today.  *  End-stage renal disease nephrology has been consulted for hemodialysis continue PhosLo and Sensipar  *  Diabetes type 2 continue sliding-scale insulin  *  Sleep apnea CPAP at bedtime  *  GERD Protonix IV  *  hypertension hold bp meds on clondine tts     Code Status Orders  (From admission, onward)         Start     Ordered   05/11/19 2137  Full code  Continuous     05/11/19 2136        Code Status History    Date Active Date Inactive Code Status Order ID  Comments User Context   07/27/2018 1819 07/30/2018 1640 Full Code [id number]  Sela Hua, MD Inpatient   05/10/2018 1831 05/13/2018 2249 Full Code [id number]  Hillary Bow, MD ED   01/24/2017 2348 01/29/2017 1614 Full Code [id number]  [place], [place], DO Inpatient   11/19/2016 1528 11/23/2016 2156 Full Code [id number]  Loletha Grayer, MD Inpatient   06/05/2016 0108 06/08/2016 1345 Full Code [id number]  Lance Coon, MD Inpatient   05/12/2016 1432 05/15/2016 1746 Full Code [id number]  Lytle Butte, MD Inpatient   05/04/2016 1455 05/09/2016 1828 Full Code [id number]  Epifanio Lesches, MD ED   04/17/2016 1336 04/20/2016 2041 Full Code [id number]  Demetrios Loll, MD Inpatient   03/04/2016  1358 03/10/2016 1440 Full Code [id number]  Hillary Bow, MD ED   01/21/2016 2115 01/27/2016 1801 Full Code [id number]  Edwin Dada, MD Inpatient   01/20/2016 1841 01/21/2016 2115 Full Code [id number]  Theodoro Grist, MD Inpatient   12/18/2015 0214 12/23/2015 1651 Full Code [id number]  Saundra Shelling, MD Inpatient   [place] Activity     Consults  Neurology, Palliative care, Nephrology  DVT Prophylaxis  Heparin  Lab Results  Component Value Date   PLT 282 05/17/2019   Time Spent in minutes  32mn  SLeia AlfSudini M.D on 05/17/2019 at 12:11 PM  Between 7am to 6pm - Pager - [phone]  After 6pm go to [email or web address] - [email or web address]   Office  [phone]

## 2019-05-17 NOTE — Progress Notes (Signed)
Pre HD assessment   05/17/19 0950  Neurological  Level of Consciousness Alert  Orientation Level Disoriented X4;Other (comment) (Pt is feeling emotional and crying)  Respiratory  Respiratory Pattern Regular;Unlabored  Chest Assessment Chest expansion symmetrical  Bilateral Breath Sounds Diminished;Rhonchi  R Upper  Breath Sounds Rhonchi  L Upper Breath Sounds Clear  R Lower Breath Sounds Diminished  L Lower Breath Sounds Diminished  Cough None  Cardiac  Pulse Regular  Heart Sounds S1, S2  Jugular Venous Distention (JVD) No  ECG Monitor Yes  Cardiac Rhythm NSR  Antiarrhythmic device No  Vascular  R Radial Pulse +2  L Radial Pulse +2  Edema Right lower extremity;Left lower extremity  RUE Edema Non-pitting  RLE Edema Non-pitting  Integumentary  Integumentary (WDL) X  Skin Color Appropriate for ethnicity  Skin Condition Dry  Skin Integrity Intact  Musculoskeletal  Musculoskeletal (WDL) X  Generalized Weakness Yes  Assistive Device None  Gastrointestinal  Bowel Sounds Assessment Active  GU Assessment  Genitourinary (WDL) X  Genitourinary Symptoms Oliguria (HD pt)  Psychosocial  Psychosocial (WDL) X  Patient Behaviors Not interactive;Calm  Needs Expressed Other (Comment) (Aphasia)  Emotional support given Given to patient (RN advised)

## 2019-05-17 NOTE — Progress Notes (Signed)
Initial Nutrition Assessment  DOCUMENTATION CODES:   Not applicable  INTERVENTION:   If family wishes to pursue full aggressive care, recommend NGT placement and nutrition support   Pt is at high refeed risk   NUTRITION DIAGNOSIS:   Inadequate oral intake related to dysphagia as evidenced by NPO status.  GOAL:   Patient will meet greater than or equal to 90% of their needs  MONITOR:   Diet advancement, Labs, Weight trends, Skin, I & O's  REASON FOR ASSESSMENT:   NPO/Clear Liquid Diet    ASSESSMENT:   78 y.o. female with a known history of end-stage renal disease on HD, bladder cancer, COPD, coronary artery disease, diabetes type 2, GERD with history of sleep apnea who was seen in the emergency room with complaint of abdominal pain and shortness of breath. Pt found to have bacteremia and CAP  RD working remotely.  Unable to speak with patient r/t AMS. Per chart review, pt has been NPO x 7 days. Pt seen by SLP and has been unable to advance her diet r/t aspiration risk. Pt seen by palliative care today; family is undecided about code status and wishes for patient to receive full scope at this time. Per palliative note, family is unsure if patient would want a feeding tube. At this point, if family wishes to proceed with full aggressive care, would recommend NGT placement and nutrition support to provide temporary nutrition to see if this would improve pt's weakness and ability to swallow. Pt is at high refeed risk.   Per chart, pt has lost 33lbs(17%) in < 1 year.   Pt is at high risk for malnutrition but unable to diagnose at this time as NFPE cannot be performed.      Medications reviewed and include: heparin, protonix, Unasyn   Labs reviewed: Na 146(H), K 4.7 wnl, BUN 107(H), creat 12.97(H), P 7.6(H), Mg 2.1 wnl Hgb 10.1(L), Hct 33.6(L)  Unable to complete Nutrition-Focused physical exam at this time.   Diet Order:   Diet Order            Diet NPO time specified   Diet effective now             EDUCATION NEEDS:   No education needs have been identified at this time  Skin:  Skin Assessment: Reviewed RN Assessment  Last BM:  10/26- type 6  Height:   Ht Readings from Last 1 Encounters:  05/11/19 5' 8"  (1.727 m)    Weight:   Wt Readings from Last 1 Encounters:  05/17/19 74.3 kg    Ideal Body Weight:  63.6 kg  BMI:  Body mass index is 24.91 kg/m.  Estimated Nutritional Needs:   Kcal:  1700-1900kcal/day  Protein:  85-95g/day  Fluid:  UOP +1L  Koleen Distance MS, RD, LDN Pager #- [phone] Office#- [phone] After Hours Pager: [phone]

## 2019-05-17 NOTE — Progress Notes (Signed)
HD Tx completed   05/17/19 1330  Vital Signs  Pulse Rate 85  Pulse Rate Source Monitor  Resp 15  BP (!) 118/53  BP Location Right Arm  BP Method Automatic  Patient Position (if appropriate) Lying  Oxygen Therapy  SpO2 100 %  O2 Device Nasal Cannula  O2 Flow Rate (L/min) 2 L/min  Pain Assessment  Pain Scale 0-10  Pain Score 0  During Hemodialysis Assessment  Blood Flow Rate (mL/min) 300 mL/min  Arterial Pressure (mmHg) -150 mmHg  Venous Pressure (mmHg) 190 mmHg  Transmembrane Pressure (mmHg) 60 mmHg  Ultrafiltration Rate (mL/min) 10 mL/min  Dialysate Flow Rate (mL/min) 800 ml/min  Conductivity: Machine  14  HD Safety Checks Performed Yes  Intra-Hemodialysis Comments Tx completed

## 2019-05-17 NOTE — Progress Notes (Signed)
HD Tx start    05/17/19 0951  Vital Signs  Pulse Rate 80  Resp 12  BP (!) 137/49  BP Location Right Arm  BP Method Automatic  Patient Position (if appropriate) Lying  Oxygen Therapy  SpO2 99 %  O2 Device Nasal Cannula  O2 Flow Rate (L/min) 2 L/min  Pain Assessment  Pain Scale 0-10  Pain Score 0  During Hemodialysis Assessment  Blood Flow Rate (mL/min) 400 mL/min  Arterial Pressure (mmHg) -200 mmHg  Venous Pressure (mmHg) 150 mmHg  Transmembrane Pressure (mmHg) 40 mmHg  Ultrafiltration Rate (mL/min) 140 mL/min  Dialysate Flow Rate (mL/min) 800 ml/min  Conductivity: Machine  14  HD Safety Checks Performed Yes  Dialysis Fluid Bolus Normal Saline  Bolus Amount (mL) 250 mL  Intra-Hemodialysis Comments Tx initiated (Pt is stable)

## 2019-05-17 NOTE — Progress Notes (Signed)
Post HD Tx Assessment   05/17/19 1350  Neurological  Level of Consciousness Alert  Orientation Level Disoriented X4;Other (comment) (Pt is feeling emotional and crying)  Respiratory  Respiratory Pattern Regular;Unlabored  Chest Assessment Chest expansion symmetrical  Bilateral Breath Sounds Clear;Diminished;Rhonchi  Cardiac  Pulse Regular  Heart Sounds S1, S2  Jugular Venous Distention (JVD) No  ECG Monitor Yes  Cardiac Rhythm NSR  Antiarrhythmic device No  Vascular  R Radial Pulse +2  L Radial Pulse +2  Edema Right lower extremity;Left lower extremity  RUE Edema Non-pitting  RLE Edema Non-pitting  Integumentary  Integumentary (WDL) X  Skin Color Appropriate for ethnicity  Skin Condition Dry  Skin Integrity Intact  Musculoskeletal  Musculoskeletal (WDL) X  Generalized Weakness Yes  Gastrointestinal  Bowel Sounds Assessment Active  GU Assessment  Genitourinary (WDL) X  Genitourinary Symptoms Oliguria (HD pt)  Psychosocial  Psychosocial (WDL) X  Patient Behaviors Not interactive;Calm  Needs Expressed Other (Comment) (Aphasia)  Emotional support given Given to patient (RN advised)

## 2019-05-17 NOTE — Progress Notes (Signed)
Daily Progress Note   Patient Name: Barbara [name]       Date: 05/17/2019 DOB: Feb 14, 1941  Age: 78 y.o. MRN#: [id number] Attending Physician: Hillary Bow, MD Primary Care Physician: [name], MD Admit Date: 05/11/2019  Reason for Consultation/Follow-up: Establishing goals of care  Subjective: Patient in dialysis. Husband at bedside as planned.    We discussed her diagnosis, prognosis, GOC, EOL wishes disposition and options.  A detailed discussion was had today regarding advanced directives.  Concepts specific to code status, artifical feeding and hydration, IV antibiotics and rehospitalization were discussed.  The difference between an aggressive medical intervention path and a comfort care path was discussed.  Values and goals of care important to patient and family were attempted to be elicited.  Discussed limitations of medical interventions to prolong quality of life in some situations and discussed the concept of human mortality.  We discussed her swallowing as he states she has been complaining of difficulty swallowing, but can only say she complained once in a while. At home, she cooks for herself, and does a bit of the cleaning. She pays the [employer]hold bills. She drove the day before her current admission. He states at time she does not talk very much, and is always tired when returning from dialysis.   He states he and his wife have only discussed advanced directives so far as not wanting to live in a vegetative state on a machine. He is unsure if she would want CPR or a feeding tube. He states as the second husband, he is clear that he the decision maker, but to keep peace must include the daughters. He states he will speak with her daughters about these decisions. He states  she has asked at the dialysis center what it would look like if she stopped dialysis. He states he is unsure of anything else stated in the conversation.    Patient returned from dialysis. She attempts to speak but most of her verbalization is not clear and could not be understood. When asked if she had pain she said "no". When asked if she was feeling okay, she said "hell no". She would not elaborate on this.  Length of Stay: 6  Current Medications: Scheduled Meds:  . chlorhexidine  15 mL Mouth/Throat Q4H  . Chlorhexidine Gluconate Cloth  6 each  Topical Daily  . cloNIDine  0.2 mg Transdermal Weekly  . heparin  5,000 Units Subcutaneous Q8H  . influenza vaccine adjuvanted  0.5 mL Intramuscular Tomorrow-1000  . lidocaine (PF)  5 mL Intradermal Once  . mupirocin ointment  1 application Nasal BID  . pantoprazole (PROTONIX) IV  40 mg Intravenous Q12H  . sodium chloride flush  3 mL Intravenous Q12H    Continuous Infusions: . sodium chloride Stopped (05/15/19 2045)  . ampicillin-sulbactam (UNASYN) IV Stopped (05/17/19 0720)    PRN Meds: sodium chloride, acetaminophen **OR** acetaminophen, albuterol, iohexol  Physical Exam Pulmonary:     Effort: Pulmonary effort is normal.  Neurological:     Mental Status: She is alert.             Vital Signs: BP (!) 118/54 (BP Location: Right Arm)   Pulse 82   Temp 98.5 F (36.9 C) (Oral)   Resp 19   Ht 5' 8"  (1.727 m)   Wt 74.3 kg   SpO2 100%   BMI 24.91 kg/m  SpO2: SpO2: 100 % O2 Device: O2 Device: Nasal Cannula O2 Flow Rate: O2 Flow Rate (L/min): 2 L/min  Intake/output summary:   Intake/Output Summary (Last 24 hours) at 05/17/2019 1423 Last data filed at 05/17/2019 1350 Gross per 24 hour  Intake 208.82 ml  Output -182 ml  Net 390.82 ml   LBM: Last BM Date: 05/15/19 Baseline Weight: Weight: 82 kg Most recent weight: Weight: 74.3 kg       Palliative Assessment/Data:      Patient Active Problem List   Diagnosis Date  Noted  . Somnolence   . PNA (pneumonia) 05/11/2019  . Dialysis patient ([place])   . Metabolic acidosis 47/03/6282  . Complication from renal dialysis device 01/28/2018  . History of colonic polyps 07/30/2017  . Presence of cardiac and vascular implant and graft   . Fever   . Sepsis ([place])   . End stage renal disease ([place]) 02/01/2017  . Sepsis secondary to UTI ([place]) 01/24/2017  . Acute kidney injury ([place]) 11/19/2016  . Anemia, chronic renal failure, stage 4 (severe) ([place]) 09/17/2016  . Hypoglycemia 06/04/2016  . Hyperkalemia 05/12/2016  . Protein-calorie malnutrition, severe 05/05/2016  . Acute on chronic renal failure ([place]) 05/04/2016  . Seizure ([place]) 04/17/2016  . Palliative care by specialist   . DNR (do not resuscitate) discussion   . C. difficile diarrhea 03/11/2016  . Anemia 03/11/2016  . Hypotension 03/11/2016  . Acidosis 03/11/2016  . Failure to thrive (child) 03/11/2016  . Weakness generalized 03/11/2016  . ARF (acute renal failure) ([place]) 03/04/2016  . Cancer of trigone of urinary bladder ([place]) 03/02/2016  . Absolute anemia 02/04/2016  . Airway hyperreactivity 02/04/2016  . Celiac disease 02/04/2016  . Gastric catarrh 02/04/2016  . Acid reflux 02/04/2016  . Combined fat and carbohydrate induced hyperlipemia 02/04/2016  . C. difficile colitis 01/22/2016  . Urinary obstruction 01/21/2016  . COPD (chronic obstructive pulmonary disease) ([place]) 01/21/2016  . Controlled type 2 diabetes mellitus with stage 4 chronic kidney disease, with long-term current use of insulin ([place]) 01/21/2016  . Essential hypertension 01/21/2016  . Acute renal failure superimposed on stage 4 chronic kidney disease ([place]) 01/20/2016  . Anemia in chronic kidney disease 01/20/2016  . Arthritis of knee, degenerative 05/10/2015  . Knee strain 03/26/2015  . Other intervertebral disc displacement, lumbar region 03/04/2015  . Degenerative arthritis of lumbar spine 03/04/2015  . HNP (herniated nucleus  pulposus), lumbar 03/04/2015  . Osteoarthritis of spine  with radiculopathy, lumbar region 03/04/2015  . Injury of tendon of upper extremity 11/12/2014  . Atherosclerosis of abdominal aorta ([place]) 11/02/2014  . Chronic kidney disease, stage IV (severe) ([place]) 11/02/2014  . Obstructive apnea 11/02/2014  . Complete rotator cuff rupture of left shoulder 10/05/2014  . Arthritis of shoulder region, degenerative 10/05/2014  . CAD in native artery 12/08/2013  . Benign essential HTN 12/08/2013  . Type 2 diabetes mellitus ([place]) 12/08/2013    Palliative Care Assessment & Plan    Recommendations/Plan:  Will continue to follow.   Husband is unsure about code status or other decisions on advanced care planning.   Continue full code and full scope at this time.   Code Status:    Code Status Orders  (From admission, onward)         Start     Ordered   05/11/19 2137  Full code  Continuous     05/11/19 2136        Code Status History    Date Active Date Inactive Code Status Order ID Comments User Context   07/27/2018 1819 07/30/2018 1640 Full Code [id number]  Sela Hua, MD Inpatient   05/10/2018 1831 05/13/2018 2249 Full Code [id number]  Hillary Bow, MD ED   01/24/2017 2348 01/29/2017 1614 Full Code [id number]  Lipan, [place], DO Inpatient   11/19/2016 1528 11/23/2016 2156 Full Code [id number]  Loletha Grayer, MD Inpatient   06/05/2016 0108 06/08/2016 1345 Full Code [id number]  Lance Coon, MD Inpatient   05/12/2016 1432 05/15/2016 1746 Full Code [id number]  Lytle Butte, MD Inpatient   05/04/2016 1455 05/09/2016 1828 Full Code [id number]  Epifanio Lesches, MD ED   04/17/2016 1336 04/20/2016 2041 Full Code [id number]  Demetrios Loll, MD Inpatient   03/04/2016 1358 03/10/2016 1440 Full Code [id number]  Hillary Bow, MD ED   01/21/2016 2115 01/27/2016 1801 Full Code [id number]  Edwin Dada, MD Inpatient   01/20/2016 1841 01/21/2016 2115 Full Code [id number]  Theodoro Grist, MD Inpatient    12/18/2015 0214 12/23/2015 1651 Full Code [id number]  Saundra Shelling, MD Inpatient   Advance Care Planning Activity       Prognosis:   Unable to determine  Discharge Planning:  To Be Determined    Thank you for allowing the Palliative Medicine Team to assist in the care of this patient.   Time In: 1:28 Time Out: 2: 38 Total Time 70 min Prolonged Time Billed yes       Greater than 50%  of this time was spent counseling and coordinating care related to the above assessment and plan.  Asencion Gowda, NP  Please contact Palliative Medicine Team phone at [phone] for questions and concerns.

## 2019-05-17 NOTE — Progress Notes (Signed)
[facility], [place] 05/17/19  Subjective:    Seen during HD Appears to be more alert than yesterday.  Trying to mouth words. Did answer a few simple questions. Does not appear to have strength to move arms and legs.   HEMODIALYSIS FLOWSHEET:  Blood Flow Rate (mL/min): 300 mL/min Arterial Pressure (mmHg): -150 mmHg Venous Pressure (mmHg): 190 mmHg Transmembrane Pressure (mmHg): 60 mmHg Ultrafiltration Rate (mL/min): 10 mL/min Dialysate Flow Rate (mL/min): 800 ml/min Conductivity: Machine : 14 Conductivity: Machine : 14 Dialysis Fluid Bolus: Normal Saline Bolus Amount (mL): 250 mL    Objective:  Vital signs in last 24 hours:  Temp:  [98.3 F (36.8 C)-99.4 F (37.4 C)] 98.5 F (36.9 C) (10/28 1350) Pulse Rate:  [75-89] 82 (10/28 1350) Resp:  [12-22] 19 (10/28 1350) BP: (102-137)/(40-58) 118/54 (10/28 1350) SpO2:  [97 %-100 %] 100 % (10/28 1350) Weight:  [74.3 kg] 74.3 kg (10/28 0945)  Weight change:  Filed Weights   05/15/19 1215 05/15/19 1220 05/17/19 0945  Weight: 76.1 kg 76.1 kg 74.3 kg    Intake/Output:    Intake/Output Summary (Last 24 hours) at 05/17/2019 1443 Last data filed at 05/17/2019 1350 Gross per 24 hour  Intake 208.82 ml  Output -182 ml  Net 390.82 ml     Physical Exam: General:  Lying in the bed, no acute distress  HEENT  moist oral mucous membranes  Pulm/lungs  normal breathing effort, clear  CVS/Heart  no rub  Abdomen:   Soft, nontender  Extremities:  No peripheral edema  Neurologic:  Follows person in the room, answered few simple yes/no questions  Skin:  Warm, dry  Access:  Left arm AV graft       Basic Metabolic Panel:  Recent Labs  Lab 05/11/19 1055 05/13/19 0650 05/15/19 2123 05/17/19 0930  NA 137 139 142 146*  K 3.5 3.8 4.4 4.7  CL 99 98 100 101  CO2 25 23 24 23   GLUCOSE 109* 117* 99 101*  BUN 27* 33* 58* 107*  CREATININE 7.49* 7.39* 8.84* 12.97*  CALCIUM 8.6* 8.9 9.3 9.1  MG  --    --  2.1  --   PHOS  --   --   --  7.6*     CBC: Recent Labs  Lab 05/11/19 1055 05/13/19 0650 05/17/19 0930  WBC 13.6* 13.2* 9.1  NEUTROABS 12.0*  --   --   HGB 11.3* 10.9* 10.1*  HCT 36.3 35.3* 33.6*  MCV 98.9 98.9 100.6*  PLT 199 180 282      Lab Results  Component Value Date   HEPBSAG NON REACTIVE 05/12/2019      Microbiology:  Recent Results (from the past 240 hour(s))  Blood Culture (routine x 2)     Status: Abnormal   Collection Time: 05/11/19 11:21 AM   Specimen: BLOOD  Result Value Ref Range Status   Specimen Description BLOOD RIGHT ANTECUBITAL  Final   Special Requests   Final    BOTTLES DRAWN AEROBIC AND ANAEROBIC Blood Culture adequate volume   Culture  Setup Time   Final    GRAM NEGATIVE RODS ANAEROBIC BOTTLE ONLY CRITICAL RESULT CALLED TO, READ BACK BY AND VERIFIED WITH: JASON ROBBINS AT 1644 05/12/2019 KMP    Culture BACTEROIDES FRAGILIS BETA LACTAMASE POSITIVE  (A)  Final   Report Status 05/14/2019 FINAL  Final  Urine culture     Status: Abnormal   Collection Time: 05/11/19 11:21 AM   Specimen: Urine, Random  Result Value Ref Range Status   Specimen Description   Final    URINE, RANDOM Performed at [facility], [address] [place], [place] [address]    Special Requests   Final    NONE Performed at [facility], [address] [place], [place] [address]    Culture >=100,000 COLONIES/mL ESCHERICHIA COLI (A)  Final   Report Status 05/13/2019 FINAL  Final   Organism ID, Bacteria ESCHERICHIA COLI (A)  Final      Susceptibility   Escherichia coli - MIC*    AMPICILLIN <=2 SENSITIVE Sensitive     CEFAZOLIN <=4 SENSITIVE Sensitive     CEFTRIAXONE <=1 SENSITIVE Sensitive     CIPROFLOXACIN <=0.25 SENSITIVE Sensitive     GENTAMICIN <=1 SENSITIVE Sensitive     IMIPENEM <=0.25 SENSITIVE Sensitive     NITROFURANTOIN <=16 SENSITIVE Sensitive     TRIMETH/SULFA <=20 SENSITIVE Sensitive     AMPICILLIN/SULBACTAM <=2 SENSITIVE  Sensitive     PIP/TAZO <=4 SENSITIVE Sensitive     Extended ESBL NEGATIVE Sensitive     * >=100,000 COLONIES/mL ESCHERICHIA COLI  Blood Culture ID Panel (Reflexed)     Status: None   Collection Time: 05/11/19 11:21 AM  Result Value Ref Range Status   Enterococcus species NOT DETECTED NOT DETECTED Final   Listeria monocytogenes NOT DETECTED NOT DETECTED Final   Staphylococcus species NOT DETECTED NOT DETECTED Final   Staphylococcus aureus (BCID) NOT DETECTED NOT DETECTED Final   Streptococcus species NOT DETECTED NOT DETECTED Final   Streptococcus agalactiae NOT DETECTED NOT DETECTED Final   Streptococcus pneumoniae NOT DETECTED NOT DETECTED Final   Streptococcus pyogenes NOT DETECTED NOT DETECTED Final   Acinetobacter baumannii NOT DETECTED NOT DETECTED Final   Enterobacteriaceae species NOT DETECTED NOT DETECTED Final   Enterobacter cloacae complex NOT DETECTED NOT DETECTED Final   Escherichia coli NOT DETECTED NOT DETECTED Final   Klebsiella oxytoca NOT DETECTED NOT DETECTED Final   Klebsiella pneumoniae NOT DETECTED NOT DETECTED Final   Proteus species NOT DETECTED NOT DETECTED Final   Serratia marcescens NOT DETECTED NOT DETECTED Final   Haemophilus influenzae NOT DETECTED NOT DETECTED Final   Neisseria meningitidis NOT DETECTED NOT DETECTED Final   Pseudomonas aeruginosa NOT DETECTED NOT DETECTED Final   Candida albicans NOT DETECTED NOT DETECTED Final   Candida glabrata NOT DETECTED NOT DETECTED Final   Candida krusei NOT DETECTED NOT DETECTED Final   Candida parapsilosis NOT DETECTED NOT DETECTED Final   Candida tropicalis NOT DETECTED NOT DETECTED Final    Comment: Performed at [facility], [address] [place],  [address]  SARS Coronavirus 2 by RT PCR (hospital order, performed in [place] hospital lab) Nasopharyngeal Nasopharyngeal Swab     Status: None   Collection Time: 05/11/19 12:01 PM   Specimen: Nasopharyngeal Swab  Result Value Ref Range  Status   SARS Coronavirus 2 NEGATIVE NEGATIVE Final    Comment: (NOTE) If result is NEGATIVE SARS-CoV-2 target nucleic acids are NOT DETECTED. The SARS-CoV-2 RNA is generally detectable in upper and lower  respiratory specimens during the acute phase of infection. The lowest  concentration of SARS-CoV-2 viral copies this assay can detect is 250  copies / mL. A negative result does not preclude SARS-CoV-2 infection  and should not be used as the sole basis for treatment or other  patient management decisions.  A negative result may occur with  improper specimen collection / handling, submission of specimen other  than nasopharyngeal  swab, presence of viral mutation(s) within the  areas targeted by this assay, and inadequate number of viral copies  (<250 copies / mL). A negative result must be combined with clinical  observations, patient history, and epidemiological information. If result is POSITIVE SARS-CoV-2 target nucleic acids are DETECTED. The SARS-CoV-2 RNA is generally detectable in upper and lower  respiratory specimens dur ing the acute phase of infection.  Positive  results are indicative of active infection with SARS-CoV-2.  Clinical  correlation with patient history and other diagnostic information is  necessary to determine patient infection status.  Positive results do  not rule out bacterial infection or co-infection with other viruses. If result is PRESUMPTIVE POSTIVE SARS-CoV-2 nucleic acids MAY BE PRESENT.   A presumptive positive result was obtained on the submitted specimen  and confirmed on repeat testing.  While 2019 novel coronavirus  (SARS-CoV-2) nucleic acids may be present in the submitted sample  additional confirmatory testing may be necessary for epidemiological  and / or clinical management purposes  to differentiate between  SARS-CoV-2 and other Sarbecovirus currently known to infect humans.  If clinically indicated additional testing with an alternate  test  methodology [phone]) is advised. The SARS-CoV-2 RNA is generally  detectable in upper and lower respiratory sp ecimens during the acute  phase of infection. The expected result is Negative. Fact Sheet for Patients:  StrictlyIdeas.no Fact Sheet for Healthcare Providers: BankingDealers.co.za This test is not yet approved or cleared by the [place] FDA and has been authorized for detection and/or diagnosis of SARS-CoV-2 by FDA under an Emergency Use Authorization (EUA).  This EUA will remain in effect (meaning this test can be used) for the duration of the COVID-19 declaration under Section 564(b)(1) of the Act, 21 U.S.C. section 360bbb-3(b)(1), unless the authorization is terminated or revoked sooner. Performed at [facility], [address] [place], [place] [address]   Blood Culture (routine x 2)     Status: None   Collection Time: 05/11/19  9:59 PM   Specimen: BLOOD  Result Value Ref Range Status   Specimen Description BLOOD BLOOD RIGHT HAND  Final   Special Requests   Final    BOTTLES DRAWN AEROBIC AND ANAEROBIC Blood Culture adequate volume   Culture   Final    NO GROWTH 5 DAYS Performed at [facility], [address] [place], [place] [address]    Report Status 05/16/2019 FINAL  Final  MRSA PCR Screening     Status: Abnormal   Collection Time: 05/12/19  7:01 PM   Specimen: Nasal Mucosa; Nasopharyngeal  Result Value Ref Range Status   MRSA by PCR POSITIVE (A) NEGATIVE Final    Comment:        The GeneXpert MRSA Assay (FDA approved for NASAL specimens only), is one component of a comprehensive MRSA colonization surveillance program. It is not intended to diagnose MRSA infection nor to guide or monitor treatment for MRSA infections. CRITICAL RESULT CALLED TO, READ BACK BY AND VERIFIED WITH: Jacqulynn Cadet AT 2103 05/12/2019 KMP Performed at [facility], [address]  [place], [place] [address]     Coagulation Studies: No results for input(s): LABPROT, INR in the last 72 hours.  Urinalysis: No results for input(s): COLORURINE, LABSPEC, PHURINE, GLUCOSEU, HGBUR, BILIRUBINUR, KETONESUR, PROTEINUR, UROBILINOGEN, NITRITE, LEUKOCYTESUR in the last 72 hours.  Invalid input(s): APPERANCEUR    Imaging: No results found.   Medications:   . sodium chloride Stopped (05/15/19 2045)  . ampicillin-sulbactam (UNASYN) IV  Stopped (05/17/19 0720)   . chlorhexidine  15 mL Mouth/Throat Q4H  . Chlorhexidine Gluconate Cloth  6 each Topical Daily  . cloNIDine  0.2 mg Transdermal Weekly  . heparin  5,000 Units Subcutaneous Q8H  . influenza vaccine adjuvanted  0.5 mL Intramuscular Tomorrow-1000  . lidocaine (PF)  5 mL Intradermal Once  . mupirocin ointment  1 application Nasal BID  . pantoprazole (PROTONIX) IV  40 mg Intravenous Q12H  . sodium chloride flush  3 mL Intravenous Q12H   sodium chloride, acetaminophen **OR** acetaminophen, albuterol, iohexol  Assessment/ Plan:  78 y.o. African-American female  with end stage renal disease on hemodialysis, hypertension, diabetes mellitus type II, diabetic neuropathy who is admitted to [facility] on 05/11/2019 for [facility] MWF-1//TW84kg//[facility]//left arm AV graft  1.  End-stage renal disease -Patient seen during hemodialysis.  Tolerating fair -Venous pressures are too high today, therefore blood flow had to be decreased - next HD om Friday  2.  Sepsis with altered mental status Blood cultures from 10/22 are positive for E Coli.  Urine culture positive for E. coli greater than 100,000 colonies CT negative for stroke Neurology team following  3.  Anemia of chronic kidney disease Lab Results  Component Value Date   HGB 10.1 (L) 05/17/2019  Continue low-dose Epogen with hemodialysis  4.  Secondary hyperparathyroidism Lab Results  Component Value Date   CALCIUM 9.1 05/17/2019   PHOS 7.6 (H)  05/17/2019   Monitor calcium and phosphorus closely  5.  Nutrition -Poor nutrition due to inability to take p.o. -Evaluation is continuing.    LOS: [place] 10/28/20202:43 PM  [place], [place]  Note: This note was prepared with Dragon dictation. Any transcription errors are unintentional

## 2019-05-17 NOTE — Progress Notes (Signed)
Pre HD Tx Note Pt arrived from her room to receive HD TX. Pt is on 2L O2 per [place]. Pt reports no chest pain or SOB. Pt is feeling emotional and crying wanting to see her sister. I tried to provide support and reported emotional status to primary RN for possible psychiatric consult. Pt UF goal is Zero. Actual UF goal set is only 500 as 250 pre and post BS flush.  Pt Tx is running on 3K2.5Ca for 3.5 hrs.    05/17/19 0945  Hand-Off documentation  Report given to (Full Name) Newt Minion RN  Report received from (Full Name) Dub Mikes RN  Vital Signs  Temp 98.3 F (36.8 C)  Temp Source Oral  Pulse Rate 78  Resp 20  BP (!) 137/49  BP Location Right Arm  BP Method Automatic  Patient Position (if appropriate) Lying  Oxygen Therapy  SpO2 99 %  O2 Device Nasal Cannula  O2 Flow Rate (L/min) 2 L/min  Pain Assessment  Pain Scale 0-10  Pain Score 0  Dialysis Weight  Weight 74.3 kg  Type of Weight Pre-Dialysis  Time-Out for Hemodialysis  What Procedure? HD  Pt Identifiers(min of two) First/Last Name;MRN/Account#  Correct Site? Yes  Correct Side? Yes  Correct Procedure? Yes  Consents Verified? Yes  Safety Precautions Reviewed? Yes  Engineer, civil (consulting) Number 1  Station Number 3  UF/Alarm Test Passed  Conductivity: Meter 13.5  Conductivity: Machine  13.8  pH 7.4  Reverse Osmosis Main  Normal Saline Lot Number [id number]  Dialyzer Lot Number [id number]  Disposable Set Lot Number [id number]  Machine Temperature (!) 96.1 F (35.6 C)  Air Detector Armed and Audible Yes  Blood Lines Intact and Secured Yes  Pre Treatment Patient Checks  Vascular access used during treatment Graft  HD catheter dressing before treatment WDL  Patient is receiving dialysis in a chair  (In Bed)  Hepatitis B Surface Antigen Results Negative  Date Hepatitis B Surface Antigen Drawn 05/12/19  Hepatitis B Surface Antibody  (<10)  Date Hepatitis B Surface Antibody Drawn 05/12/19  Hemodialysis Consent Verified Yes   Hemodialysis Standing Orders Initiated Yes  ECG (Telemetry) Monitor On Yes  Prime Ordered Normal Saline  Length of  DialysisTreatment -hour(s) 3.5 Hour(s)  Dialysis Treatment Comments  (WI203)  Dialyzer Elisio 17H NR  Dialysate 3K;2.5 Ca  Dialysis Anticoagulant None  Dialysate Flow Ordered 800  Blood Flow Rate Ordered 400 mL/min  Ultrafiltration Goal 0 Liters  Pre Treatment Labs Renal panel;CBC  Dialysis Blood Pressure Support Ordered Albumin  Fistula / Graft Left Upper arm Arteriovenous vein graft  Placement Date/Time: 02/17/17 1111   Orientation: Left  Access Location: Upper arm  Access Type: Arteriovenous vein graft  Site Condition No complications  Fistula / Graft Assessment Thrill;Bruit  Status Accessed  Needle Size 15  Drainage Description None

## 2019-05-18 DIAGNOSIS — R7881 Bacteremia: Secondary | ICD-10-CM

## 2019-05-18 DIAGNOSIS — B966 Bacteroides fragilis [B. fragilis] as the cause of diseases classified elsewhere: Secondary | ICD-10-CM | POA: Diagnosis not present

## 2019-05-18 DIAGNOSIS — B9620 Unspecified Escherichia coli [E. coli] as the cause of diseases classified elsewhere: Secondary | ICD-10-CM

## 2019-05-18 DIAGNOSIS — K219 Gastro-esophageal reflux disease without esophagitis: Secondary | ICD-10-CM

## 2019-05-18 DIAGNOSIS — Z888 Allergy status to other drugs, medicaments and biological substances status: Secondary | ICD-10-CM

## 2019-05-18 DIAGNOSIS — Z923 Personal history of irradiation: Secondary | ICD-10-CM

## 2019-05-18 DIAGNOSIS — N3090 Cystitis, unspecified without hematuria: Secondary | ICD-10-CM

## 2019-05-18 DIAGNOSIS — Z9079 Acquired absence of other genital organ(s): Secondary | ICD-10-CM

## 2019-05-18 DIAGNOSIS — N186 End stage renal disease: Secondary | ICD-10-CM

## 2019-05-18 DIAGNOSIS — Z87891 Personal history of nicotine dependence: Secondary | ICD-10-CM

## 2019-05-18 DIAGNOSIS — Z992 Dependence on renal dialysis: Secondary | ICD-10-CM

## 2019-05-18 DIAGNOSIS — Z9221 Personal history of antineoplastic chemotherapy: Secondary | ICD-10-CM

## 2019-05-18 DIAGNOSIS — N1330 Unspecified hydronephrosis: Secondary | ICD-10-CM

## 2019-05-18 DIAGNOSIS — Z880 Allergy status to penicillin: Secondary | ICD-10-CM

## 2019-05-18 DIAGNOSIS — G4730 Sleep apnea, unspecified: Secondary | ICD-10-CM

## 2019-05-18 DIAGNOSIS — R400 Somnolence: Secondary | ICD-10-CM

## 2019-05-18 DIAGNOSIS — E1122 Type 2 diabetes mellitus with diabetic chronic kidney disease: Secondary | ICD-10-CM

## 2019-05-18 DIAGNOSIS — G9340 Encephalopathy, unspecified: Secondary | ICD-10-CM

## 2019-05-18 DIAGNOSIS — J449 Chronic obstructive pulmonary disease, unspecified: Secondary | ICD-10-CM

## 2019-05-18 DIAGNOSIS — I2510 Atherosclerotic heart disease of native coronary artery without angina pectoris: Secondary | ICD-10-CM

## 2019-05-18 DIAGNOSIS — Z8551 Personal history of malignant neoplasm of bladder: Secondary | ICD-10-CM

## 2019-05-18 MED ORDER — NEPRO/CARBSTEADY PO LIQD
237.0000 mL | Freq: Two times a day (BID) | ORAL | Status: DC
  Administered 2019-05-18 – 2019-05-24 (×9): 237 mL via ORAL

## 2019-05-18 MED ORDER — RENA-VITE PO TABS
1.0000 | ORAL_TABLET | Freq: Every day | ORAL | Status: DC
  Administered 2019-05-20 – 2019-05-25 (×3): 1 via ORAL
  Filled 2019-05-18 (×9): qty 1

## 2019-05-18 MED ORDER — VITAMIN C 500 MG PO TABS
500.0000 mg | ORAL_TABLET | Freq: Two times a day (BID) | ORAL | Status: DC
  Administered 2019-05-20 – 2019-05-26 (×9): 500 mg via ORAL
  Filled 2019-05-18 (×17): qty 1

## 2019-05-18 NOTE — Progress Notes (Signed)
Daily Progress Note   Patient Name: Barbara [name]       Date: 05/18/2019 DOB: 1940-09-25  Age: 78 y.o. MRN#: [id number] Attending Physician: Hillary Bow, MD Primary Care Physician: [name], MD Admit Date: 05/11/2019  Reason for Consultation/Follow-up: Establishing goals of care  Subjective: Patient is resting in bed this morning. Staff at bedside and offering her food. She would like ice cream. She is much more interactive, and tells me "I feel great". She is confused in conversation.    Length of Stay: 7  Current Medications: Scheduled Meds:  . chlorhexidine  15 mL Mouth/Throat Q4H  . Chlorhexidine Gluconate Cloth  6 each Topical Daily  . cloNIDine  0.2 mg Transdermal Weekly  . feeding supplement (NEPRO CARB STEADY)  237 mL Oral BID BM  . heparin  5,000 Units Subcutaneous Q8H  . influenza vaccine adjuvanted  0.5 mL Intramuscular Tomorrow-1000  . lidocaine (PF)  5 mL Intradermal Once  . multivitamin  1 tablet Oral QHS  . mupirocin ointment  1 application Nasal BID  . pantoprazole (PROTONIX) IV  40 mg Intravenous Q12H  . sodium chloride flush  3 mL Intravenous Q12H  . vitamin C  500 mg Oral BID    Continuous Infusions: . sodium chloride Stopped (05/15/19 2045)  . ampicillin-sulbactam (UNASYN) IV 3 g (05/18/19 0526)    PRN Meds: sodium chloride, acetaminophen **OR** acetaminophen, albuterol, iohexol  Physical Exam Pulmonary:     Effort: Pulmonary effort is normal.  Neurological:     Mental Status: She is alert.             Vital Signs: BP (!) 138/48 (BP Location: Right Arm)   Pulse 82   Temp 98.3 F (36.8 C) (Oral)   Resp 16   Ht 5' 8"  (1.727 m)   Wt 74.3 kg   SpO2 99%   BMI 24.91 kg/m  SpO2: SpO2: 99 % O2 Device: O2 Device: Nasal Cannula O2 Flow  Rate: O2 Flow Rate (L/min): 2 L/min  Intake/output summary:   Intake/Output Summary (Last 24 hours) at 05/18/2019 1142 Last data filed at 05/18/2019 0941 Gross per 24 hour  Intake 3 ml  Output -182 ml  Net 185 ml   LBM: Last BM Date: 05/17/19 Baseline Weight: Weight: 82 kg Most recent weight: Weight: 74.3 kg  Palliative Assessment/Data:      Patient Active Problem List   Diagnosis Date Noted  . Somnolence   . PNA (pneumonia) 05/11/2019  . Dialysis patient ([place])   . Metabolic acidosis 36/14/4315  . Complication from renal dialysis device 01/28/2018  . History of colonic polyps 07/30/2017  . Presence of cardiac and vascular implant and graft   . Fever   . Sepsis ([place])   . End stage renal disease ([place]) 02/01/2017  . Sepsis secondary to UTI ([place]) 01/24/2017  . Acute kidney injury ([place]) 11/19/2016  . Anemia, chronic renal failure, stage 4 (severe) ([place]) 09/17/2016  . Hypoglycemia 06/04/2016  . Hyperkalemia 05/12/2016  . Protein-calorie malnutrition, severe 05/05/2016  . Acute on chronic renal failure ([place]) 05/04/2016  . Seizure ([place]) 04/17/2016  . Palliative care by specialist   . DNR (do not resuscitate) discussion   . C. difficile diarrhea 03/11/2016  . Anemia 03/11/2016  . Hypotension 03/11/2016  . Acidosis 03/11/2016  . Failure to thrive (child) 03/11/2016  . Weakness generalized 03/11/2016  . ARF (acute renal failure) ([place]) 03/04/2016  . Cancer of trigone of urinary bladder ([place]) 03/02/2016  . Absolute anemia 02/04/2016  . Airway hyperreactivity 02/04/2016  . Celiac disease 02/04/2016  . Gastric catarrh 02/04/2016  . Acid reflux 02/04/2016  . Combined fat and carbohydrate induced hyperlipemia 02/04/2016  . C. difficile colitis 01/22/2016  . Urinary obstruction 01/21/2016  . COPD (chronic obstructive pulmonary disease) ([place]) 01/21/2016  . Controlled type 2 diabetes mellitus with stage 4 chronic kidney disease, with long-term current use of insulin  ([place]) 01/21/2016  . Essential hypertension 01/21/2016  . Acute renal failure superimposed on stage 4 chronic kidney disease ([place]) 01/20/2016  . Anemia in chronic kidney disease 01/20/2016  . Arthritis of knee, degenerative 05/10/2015  . Knee strain 03/26/2015  . Other intervertebral disc displacement, lumbar region 03/04/2015  . Degenerative arthritis of lumbar spine 03/04/2015  . HNP (herniated nucleus pulposus), lumbar 03/04/2015  . Osteoarthritis of spine with radiculopathy, lumbar region 03/04/2015  . Injury of tendon of upper extremity 11/12/2014  . Atherosclerosis of abdominal aorta ([place]) 11/02/2014  . Chronic kidney disease, stage IV (severe) ([place]) 11/02/2014  . Obstructive apnea 11/02/2014  . Complete rotator cuff rupture of left shoulder 10/05/2014  . Arthritis of shoulder region, degenerative 10/05/2014  . CAD in native artery 12/08/2013  . Benign essential HTN 12/08/2013  . Type 2 diabetes mellitus ([place]) 12/08/2013    Palliative Care Assessment & Plan    Recommendations/Plan:  Will continue to follow.   Husband is unsure about code status or other decisions on advanced care planning and will speak with family. Hopefully patient will be able to personally discuss GOC in the near future as she improves.   Continue full code and full scope at this time.   Code Status:    Code Status Orders  (From admission, onward)         Start     Ordered   05/11/19 2137  Full code  Continuous     05/11/19 2136        Code Status History    Date Active Date Inactive Code Status Order ID Comments User Context   07/27/2018 1819 07/30/2018 1640 Full Code [id number]  Sela Hua, MD Inpatient   05/10/2018 1831 05/13/2018 2249 Full Code [id number]  Hillary Bow, MD ED   01/24/2017 2348 01/29/2017 1614 Full Code [id number]  [place], Ubaldo Glassing, DO Inpatient   11/19/2016 1528 11/23/2016 2156 Full Code [id number]  Loletha Grayer, MD Inpatient   06/05/2016 0108 06/08/2016 1345 Full Code  [id number]  Lance Coon, MD Inpatient   05/12/2016 1432 05/15/2016 1746 Full Code [id number]  Lytle Butte, MD Inpatient   05/04/2016 1455 05/09/2016 1828 Full Code [id number]  Epifanio Lesches, MD ED   04/17/2016 1336 04/20/2016 2041 Full Code [id number]  Demetrios Loll, MD Inpatient   03/04/2016 1358 03/10/2016 1440 Full Code [id number]  Hillary Bow, MD ED   01/21/2016 2115 01/27/2016 1801 Full Code [id number]  Edwin Dada, MD Inpatient   01/20/2016 1841 01/21/2016 2115 Full Code [id number]  Theodoro Grist, MD Inpatient   12/18/2015 0214 12/23/2015 1651 Full Code [id number]  Saundra Shelling, MD Inpatient   Advance Care Planning Activity       Prognosis:   Unable to determine  Discharge Planning:  To Be Determined    Thank you for allowing the Palliative Medicine Team to assist in the care of this patient.   Total Time 15 min Prolonged Time Billed no       Greater than 50%  of this time was spent counseling and coordinating care related to the above assessment and plan.  Asencion Gowda, NP  Please contact Palliative Medicine Team phone at [phone] for questions and concerns.

## 2019-05-18 NOTE — Progress Notes (Signed)
Subjective: Patient more interactive today although she is confused.    Objective: Current vital signs: BP (!) 138/48 (BP Location: Right Arm)   Pulse 82   Temp 98.3 F (36.8 C) (Oral)   Resp 16   Ht 5' 8"  (1.727 m)   Wt 74.3 kg   SpO2 99%   BMI 24.91 kg/m  Vital signs in last 24 hours: Temp:  [97.7 F (36.5 C)-98.5 F (36.9 C)] 98.3 F (36.8 C) (10/29 0437) Pulse Rate:  [80-89] 82 (10/29 0437) Resp:  [14-19] 16 (10/29 0437) BP: (102-138)/(46-65) 138/48 (10/29 0437) SpO2:  [98 %-100 %] 99 % (10/29 0437)  Intake/Output from previous day: 10/28 0701 - 10/29 0700 In: 108.8 [IV Piggyback:108.8] Out: -182  Intake/Output this shift: Total I/O In: 3 [I.V.:3] Out: -  Nutritional status:  Diet Order            DIET DYS 3 Room service appropriate? Yes; Fluid consistency: Thin  Diet effective now              Neurologic Exam: Mental Status: Alert.  Not cooperative.  Speech fluent but patient confused.  Cranial Nerves: II: Blinks to bilateral confrontation, pupils equal, round, reactive to light and accommodation III,IV, VI: ptosis not present, extra-ocular motions intact bilaterally V,VII: face symmetric, facial light touch sensation normal bilaterally VIII: hearing normal bilaterally IX,X: gag reflex present XI: bilateral shoulder shrug XII: midline tongue extension Motor: Moves all extremities against gravity   Lab Results: Basic Metabolic Panel: Recent Labs  Lab 05/13/19 0650 05/15/19 2123 05/17/19 0930  NA 139 142 146*  K 3.8 4.4 4.7  CL 98 100 101  CO2 23 24 23   GLUCOSE 117* 99 101*  BUN 33* 58* 107*  CREATININE 7.39* 8.84* 12.97*  CALCIUM 8.9 9.3 9.1  MG  --  2.1  --   PHOS  --   --  7.6*    Liver Function Tests: Recent Labs  Lab 05/17/19 0930  ALBUMIN 3.1*   No results for input(s): LIPASE, AMYLASE in the last 168 hours. No results for input(s): AMMONIA in the last 168 hours.  CBC: Recent Labs  Lab 05/13/19 0650 05/17/19 0930  WBC  13.2* 9.1  HGB 10.9* 10.1*  HCT 35.3* 33.6*  MCV 98.9 100.6*  PLT 180 282    Cardiac Enzymes: No results for input(s): CKTOTAL, CKMB, CKMBINDEX, TROPONINI in the last 168 hours.  Lipid Panel: No results for input(s): CHOL, TRIG, HDL, CHOLHDL, VLDL, LDLCALC in the last 168 hours.  CBG: Recent Labs  Lab 05/11/19 2122 05/12/19 0215 05/12/19 0810 05/12/19 1954 05/15/19 2022  GLUCAP 102* 90 92 115* 99    Microbiology: Results for orders placed or performed during the hospital encounter of 05/11/19  Blood Culture (routine x 2)     Status: Abnormal   Collection Time: 05/11/19 11:21 AM   Specimen: BLOOD  Result Value Ref Range Status   Specimen Description BLOOD RIGHT ANTECUBITAL  Final   Special Requests   Final    BOTTLES DRAWN AEROBIC AND ANAEROBIC Blood Culture adequate volume   Culture  Setup Time   Final    GRAM NEGATIVE RODS ANAEROBIC BOTTLE ONLY CRITICAL RESULT CALLED TO, READ BACK BY AND VERIFIED WITH: JASON ROBBINS AT 2979 05/12/2019 KMP    Culture BACTEROIDES FRAGILIS BETA LACTAMASE POSITIVE  (A)  Final   Report Status 05/14/2019 FINAL  Final  Urine culture     Status: Abnormal   Collection Time: 05/11/19 11:21 AM   Specimen:  Urine, Random  Result Value Ref Range Status   Specimen Description   Final    URINE, RANDOM Performed at [facility], [address] [place], [place] [address]    Special Requests   Final    NONE Performed at [facility], [address] [place], [place] [address]    Culture >=100,000 COLONIES/mL ESCHERICHIA COLI (A)  Final   Report Status 05/13/2019 FINAL  Final   Organism ID, Bacteria ESCHERICHIA COLI (A)  Final      Susceptibility   Escherichia coli - MIC*    AMPICILLIN <=2 SENSITIVE Sensitive     CEFAZOLIN <=4 SENSITIVE Sensitive     CEFTRIAXONE <=1 SENSITIVE Sensitive     CIPROFLOXACIN <=0.25 SENSITIVE Sensitive     GENTAMICIN <=1 SENSITIVE Sensitive     IMIPENEM <=0.25 SENSITIVE Sensitive      NITROFURANTOIN <=16 SENSITIVE Sensitive     TRIMETH/SULFA <=20 SENSITIVE Sensitive     AMPICILLIN/SULBACTAM <=2 SENSITIVE Sensitive     PIP/TAZO <=4 SENSITIVE Sensitive     Extended ESBL NEGATIVE Sensitive     * >=100,000 COLONIES/mL ESCHERICHIA COLI  Blood Culture ID Panel (Reflexed)     Status: None   Collection Time: 05/11/19 11:21 AM  Result Value Ref Range Status   Enterococcus species NOT DETECTED NOT DETECTED Final   Listeria monocytogenes NOT DETECTED NOT DETECTED Final   Staphylococcus species NOT DETECTED NOT DETECTED Final   Staphylococcus aureus (BCID) NOT DETECTED NOT DETECTED Final   Streptococcus species NOT DETECTED NOT DETECTED Final   Streptococcus agalactiae NOT DETECTED NOT DETECTED Final   Streptococcus pneumoniae NOT DETECTED NOT DETECTED Final   Streptococcus pyogenes NOT DETECTED NOT DETECTED Final   Acinetobacter baumannii NOT DETECTED NOT DETECTED Final   Enterobacteriaceae species NOT DETECTED NOT DETECTED Final   Enterobacter cloacae complex NOT DETECTED NOT DETECTED Final   Escherichia coli NOT DETECTED NOT DETECTED Final   Klebsiella oxytoca NOT DETECTED NOT DETECTED Final   Klebsiella pneumoniae NOT DETECTED NOT DETECTED Final   Proteus species NOT DETECTED NOT DETECTED Final   Serratia marcescens NOT DETECTED NOT DETECTED Final   Haemophilus influenzae NOT DETECTED NOT DETECTED Final   Neisseria meningitidis NOT DETECTED NOT DETECTED Final   Pseudomonas aeruginosa NOT DETECTED NOT DETECTED Final   Candida albicans NOT DETECTED NOT DETECTED Final   Candida glabrata NOT DETECTED NOT DETECTED Final   Candida krusei NOT DETECTED NOT DETECTED Final   Candida parapsilosis NOT DETECTED NOT DETECTED Final   Candida tropicalis NOT DETECTED NOT DETECTED Final    Comment: Performed at [facility], [address] [place], [place] [address]  SARS Coronavirus 2 by RT PCR (hospital order, performed in [place] hospital lab) Nasopharyngeal  Nasopharyngeal Swab     Status: None   Collection Time: 05/11/19 12:01 PM   Specimen: Nasopharyngeal Swab  Result Value Ref Range Status   SARS Coronavirus 2 NEGATIVE NEGATIVE Final    Comment: (NOTE) If result is NEGATIVE SARS-CoV-2 target nucleic acids are NOT DETECTED. The SARS-CoV-2 RNA is generally detectable in upper and lower  respiratory specimens during the acute phase of infection. The lowest  concentration of SARS-CoV-2 viral copies this assay can detect is 250  copies / mL. A negative result does not preclude SARS-CoV-2 infection  and should not be used as the sole basis for treatment or other  patient management decisions.  A negative result may occur with  improper specimen collection / handling, submission of specimen other  than nasopharyngeal swab, presence of viral mutation(s) within the  areas targeted by this assay, and inadequate number of viral copies  (<250 copies / mL). A negative result must be combined with clinical  observations, patient history, and epidemiological information. If result is POSITIVE SARS-CoV-2 target nucleic acids are DETECTED. The SARS-CoV-2 RNA is generally detectable in upper and lower  respiratory specimens dur ing the acute phase of infection.  Positive  results are indicative of active infection with SARS-CoV-2.  Clinical  correlation with patient history and other diagnostic information is  necessary to determine patient infection status.  Positive results do  not rule out bacterial infection or co-infection with other viruses. If result is PRESUMPTIVE POSTIVE SARS-CoV-2 nucleic acids MAY BE PRESENT.   A presumptive positive result was obtained on the submitted specimen  and confirmed on repeat testing.  While 2019 novel coronavirus  (SARS-CoV-2) nucleic acids may be present in the submitted sample  additional confirmatory testing may be necessary for epidemiological  and / or clinical management purposes  to differentiate between   SARS-CoV-2 and other Sarbecovirus currently known to infect humans.  If clinically indicated additional testing with an alternate test  methodology [phone]) is advised. The SARS-CoV-2 RNA is generally  detectable in upper and lower respiratory sp ecimens during the acute  phase of infection. The expected result is Negative. Fact Sheet for Patients:  StrictlyIdeas.no Fact Sheet for Healthcare Providers: BankingDealers.co.za This test is not yet approved or cleared by the [place] FDA and has been authorized for detection and/or diagnosis of SARS-CoV-2 by FDA under an Emergency Use Authorization (EUA).  This EUA will remain in effect (meaning this test can be used) for the duration of the COVID-19 declaration under Section 564(b)(1) of the Act, 21 U.S.C. section 360bbb-3(b)(1), unless the authorization is terminated or revoked sooner. Performed at [facility], [address] [place], [place] [address]   Blood Culture (routine x 2)     Status: None   Collection Time: 05/11/19  9:59 PM   Specimen: BLOOD  Result Value Ref Range Status   Specimen Description BLOOD BLOOD RIGHT HAND  Final   Special Requests   Final    BOTTLES DRAWN AEROBIC AND ANAEROBIC Blood Culture adequate volume   Culture   Final    NO GROWTH 5 DAYS Performed at [facility], [address] [place], [place] [address]    Report Status 05/16/2019 FINAL  Final  MRSA PCR Screening     Status: Abnormal   Collection Time: 05/12/19  7:01 PM   Specimen: Nasal Mucosa; Nasopharyngeal  Result Value Ref Range Status   MRSA by PCR POSITIVE (A) NEGATIVE Final    Comment:        The GeneXpert MRSA Assay (FDA approved for NASAL specimens only), is one component of a comprehensive MRSA colonization surveillance program. It is not intended to diagnose MRSA infection nor to guide or monitor treatment for MRSA infections. CRITICAL RESULT CALLED TO,  READ BACK BY AND VERIFIED WITH: Jacqulynn Cadet AT 2103 05/12/2019 KMP Performed at [facility], [address] [place], [place] [address]     Coagulation Studies: No results for input(s): LABPROT, INR in the last 72 hours.  Imaging: No results found.  Medications:  I have reviewed the patient's current medications. Scheduled: . chlorhexidine  15 mL Mouth/Throat Q4H  . Chlorhexidine Gluconate Cloth  6 each Topical Daily  . cloNIDine  0.2 mg Transdermal Weekly  . feeding supplement (NEPRO CARB  STEADY)  237 mL Oral BID BM  . heparin  5,000 Units Subcutaneous Q8H  . influenza vaccine adjuvanted  0.5 mL Intramuscular Tomorrow-1000  . lidocaine (PF)  5 mL Intradermal Once  . multivitamin  1 tablet Oral QHS  . mupirocin ointment  1 application Nasal BID  . pantoprazole (PROTONIX) IV  40 mg Intravenous Q12H  . sodium chloride flush  3 mL Intravenous Q12H  . vitamin C  500 mg Oral BID    Assessment/Plan: Patient continues to improve.  Although patient is confused and not cooperative with examination today, she is verbal and interacting with me.  EEG was slow with triphasic activity and consistent with an encephalopathy.    Recommendations: 1. Continues to improve.  Will continue to follow with you.    LOS: 7 days   Alexis Goodell, MD Neurology [phone] 05/18/2019  11:15 AM

## 2019-05-18 NOTE — TOC Progression Note (Signed)
Transition of Care [facility]) - Progression Note    Patient Details  Name: Barbara [name] MRN: [id number] Date of Birth: 27-Oct-1940  [facility] CM/SW Contact  [name], RN Phone Number: 05/18/2019, 4:25 PM  Clinical Narrative:    PT eval pending    Expected Discharge Plan: [place]    Expected Discharge Plan and Services Expected Discharge Plan: [place]       Living arrangements for the past 2 months: Single Family Home                                       Social Determinants of Health (SDOH) Interventions    Readmission Risk Interventions No flowsheet data found.

## 2019-05-18 NOTE — Progress Notes (Signed)
[facility], [place] 05/18/19  Subjective:    More alert today and able to answer some questions Denies SOB   Objective:  Vital signs in last 24 hours:  Temp:  [97.7 F (36.5 C)-98.5 F (36.9 C)] 98.3 F (36.8 C) (10/29 0437) Pulse Rate:  [80-85] 82 (10/29 0437) Resp:  [15-19] 16 (10/29 0437) BP: (114-138)/(46-65) 138/48 (10/29 0437) SpO2:  [99 %-100 %] 99 % (10/29 0437)  Weight change:  Filed Weights   05/15/19 1215 05/15/19 1220 05/17/19 0945  Weight: 76.1 kg 76.1 kg 74.3 kg    Intake/Output:    Intake/Output Summary (Last 24 hours) at 05/18/2019 1228 Last data filed at 05/18/2019 0941 Gross per 24 hour  Intake 3 ml  Output -182 ml  Net 185 ml     Physical Exam: General:  Lying in the bed, no acute distress  HEENT  moist oral mucous membranes  Pulm/lungs  normal breathing effort, clear  CVS/Heart  no rub  Abdomen:   Soft, nontender  Extremities:  No peripheral edema  Neurologic:  Follows person in the room, answered few simple yes/no questions  Skin:  Warm, dry  Access:  Left arm AV graft       Basic Metabolic Panel:  Recent Labs  Lab 05/13/19 0650 05/15/19 2123 05/17/19 0930  NA 139 142 146*  K 3.8 4.4 4.7  CL 98 100 101  CO2 23 24 23   GLUCOSE 117* 99 101*  BUN 33* 58* 107*  CREATININE 7.39* 8.84* 12.97*  CALCIUM 8.9 9.3 9.1  MG  --  2.1  --   PHOS  --   --  7.6*     CBC: Recent Labs  Lab 05/13/19 0650 05/17/19 0930  WBC 13.2* 9.1  HGB 10.9* 10.1*  HCT 35.3* 33.6*  MCV 98.9 100.6*  PLT 180 282      Lab Results  Component Value Date   HEPBSAG NON REACTIVE 05/12/2019      Microbiology:  Recent Results (from the past 240 hour(s))  Blood Culture (routine x 2)     Status: Abnormal   Collection Time: 05/11/19 11:21 AM   Specimen: BLOOD  Result Value Ref Range Status   Specimen Description BLOOD RIGHT ANTECUBITAL  Final   Special Requests   Final    BOTTLES DRAWN AEROBIC AND ANAEROBIC Blood  Culture adequate volume   Culture  Setup Time   Final    GRAM NEGATIVE RODS ANAEROBIC BOTTLE ONLY CRITICAL RESULT CALLED TO, READ BACK BY AND VERIFIED WITH: JASON ROBBINS AT 1644 05/12/2019 KMP    Culture BACTEROIDES FRAGILIS BETA LACTAMASE POSITIVE  (A)  Final   Report Status 05/14/2019 FINAL  Final  Urine culture     Status: Abnormal   Collection Time: 05/11/19 11:21 AM   Specimen: Urine, Random  Result Value Ref Range Status   Specimen Description   Final    URINE, RANDOM Performed at [facility], [address] [place], [place] [address]    Special Requests   Final    NONE Performed at [facility], [address] [place], [place] [address]    Culture >=100,000 COLONIES/mL ESCHERICHIA COLI (A)  Final   Report Status 05/13/2019 FINAL  Final   Organism ID, Bacteria ESCHERICHIA COLI (A)  Final      Susceptibility   Escherichia coli - MIC*    AMPICILLIN <=2 SENSITIVE Sensitive     CEFAZOLIN <=4 SENSITIVE Sensitive     CEFTRIAXONE <=1 SENSITIVE Sensitive  CIPROFLOXACIN <=0.25 SENSITIVE Sensitive     GENTAMICIN <=1 SENSITIVE Sensitive     IMIPENEM <=0.25 SENSITIVE Sensitive     NITROFURANTOIN <=16 SENSITIVE Sensitive     TRIMETH/SULFA <=20 SENSITIVE Sensitive     AMPICILLIN/SULBACTAM <=2 SENSITIVE Sensitive     PIP/TAZO <=4 SENSITIVE Sensitive     Extended ESBL NEGATIVE Sensitive     * >=100,000 COLONIES/mL ESCHERICHIA COLI  Blood Culture ID Panel (Reflexed)     Status: None   Collection Time: 05/11/19 11:21 AM  Result Value Ref Range Status   Enterococcus species NOT DETECTED NOT DETECTED Final   Listeria monocytogenes NOT DETECTED NOT DETECTED Final   Staphylococcus species NOT DETECTED NOT DETECTED Final   Staphylococcus aureus (BCID) NOT DETECTED NOT DETECTED Final   Streptococcus species NOT DETECTED NOT DETECTED Final   Streptococcus agalactiae NOT DETECTED NOT DETECTED Final   Streptococcus pneumoniae NOT DETECTED NOT DETECTED Final    Streptococcus pyogenes NOT DETECTED NOT DETECTED Final   Acinetobacter baumannii NOT DETECTED NOT DETECTED Final   Enterobacteriaceae species NOT DETECTED NOT DETECTED Final   Enterobacter cloacae complex NOT DETECTED NOT DETECTED Final   Escherichia coli NOT DETECTED NOT DETECTED Final   Klebsiella oxytoca NOT DETECTED NOT DETECTED Final   Klebsiella pneumoniae NOT DETECTED NOT DETECTED Final   Proteus species NOT DETECTED NOT DETECTED Final   Serratia marcescens NOT DETECTED NOT DETECTED Final   Haemophilus influenzae NOT DETECTED NOT DETECTED Final   Neisseria meningitidis NOT DETECTED NOT DETECTED Final   Pseudomonas aeruginosa NOT DETECTED NOT DETECTED Final   Candida albicans NOT DETECTED NOT DETECTED Final   Candida glabrata NOT DETECTED NOT DETECTED Final   Candida krusei NOT DETECTED NOT DETECTED Final   Candida parapsilosis NOT DETECTED NOT DETECTED Final   Candida tropicalis NOT DETECTED NOT DETECTED Final    Comment: Performed at [facility], [address] [place], [place] [address]  SARS Coronavirus 2 by RT PCR (hospital order, performed in [place] hospital lab) Nasopharyngeal Nasopharyngeal Swab     Status: None   Collection Time: 05/11/19 12:01 PM   Specimen: Nasopharyngeal Swab  Result Value Ref Range Status   SARS Coronavirus 2 NEGATIVE NEGATIVE Final    Comment: (NOTE) If result is NEGATIVE SARS-CoV-2 target nucleic acids are NOT DETECTED. The SARS-CoV-2 RNA is generally detectable in upper and lower  respiratory specimens during the acute phase of infection. The lowest  concentration of SARS-CoV-2 viral copies this assay can detect is 250  copies / mL. A negative result does not preclude SARS-CoV-2 infection  and should not be used as the sole basis for treatment or other  patient management decisions.  A negative result may occur with  improper specimen collection / handling, submission of specimen other  than nasopharyngeal swab, presence of  viral mutation(s) within the  areas targeted by this assay, and inadequate number of viral copies  (<250 copies / mL). A negative result must be combined with clinical  observations, patient history, and epidemiological information. If result is POSITIVE SARS-CoV-2 target nucleic acids are DETECTED. The SARS-CoV-2 RNA is generally detectable in upper and lower  respiratory specimens dur ing the acute phase of infection.  Positive  results are indicative of active infection with SARS-CoV-2.  Clinical  correlation with patient history and other diagnostic information is  necessary to determine patient infection status.  Positive results do  not rule out bacterial infection or co-infection with other viruses. If result is PRESUMPTIVE POSTIVE SARS-CoV-2 nucleic  acids MAY BE PRESENT.   A presumptive positive result was obtained on the submitted specimen  and confirmed on repeat testing.  While 2019 novel coronavirus  (SARS-CoV-2) nucleic acids may be present in the submitted sample  additional confirmatory testing may be necessary for epidemiological  and / or clinical management purposes  to differentiate between  SARS-CoV-2 and other Sarbecovirus currently known to infect humans.  If clinically indicated additional testing with an alternate test  methodology [phone]) is advised. The SARS-CoV-2 RNA is generally  detectable in upper and lower respiratory sp ecimens during the acute  phase of infection. The expected result is Negative. Fact Sheet for Patients:  StrictlyIdeas.no Fact Sheet for Healthcare Providers: BankingDealers.co.za This test is not yet approved or cleared by the [place] FDA and has been authorized for detection and/or diagnosis of SARS-CoV-2 by FDA under an Emergency Use Authorization (EUA).  This EUA will remain in effect (meaning this test can be used) for the duration of the COVID-19 declaration under Section  564(b)(1) of the Act, 21 U.S.C. section 360bbb-3(b)(1), unless the authorization is terminated or revoked sooner. Performed at [facility], [address] [place], [place] [address]   Blood Culture (routine x 2)     Status: None   Collection Time: 05/11/19  9:59 PM   Specimen: BLOOD  Result Value Ref Range Status   Specimen Description BLOOD BLOOD RIGHT HAND  Final   Special Requests   Final    BOTTLES DRAWN AEROBIC AND ANAEROBIC Blood Culture adequate volume   Culture   Final    NO GROWTH 5 DAYS Performed at [facility], [address] [place], [place] [address]    Report Status 05/16/2019 FINAL  Final  MRSA PCR Screening     Status: Abnormal   Collection Time: 05/12/19  7:01 PM   Specimen: Nasal Mucosa; Nasopharyngeal  Result Value Ref Range Status   MRSA by PCR POSITIVE (A) NEGATIVE Final    Comment:        The GeneXpert MRSA Assay (FDA approved for NASAL specimens only), is one component of a comprehensive MRSA colonization surveillance program. It is not intended to diagnose MRSA infection nor to guide or monitor treatment for MRSA infections. CRITICAL RESULT CALLED TO, READ BACK BY AND VERIFIED WITH: Jacqulynn Cadet AT 2103 05/12/2019 KMP Performed at [facility], [address] [place], [place] [address]     Coagulation Studies: No results for input(s): LABPROT, INR in the last 72 hours.  Urinalysis: No results for input(s): COLORURINE, LABSPEC, PHURINE, GLUCOSEU, HGBUR, BILIRUBINUR, KETONESUR, PROTEINUR, UROBILINOGEN, NITRITE, LEUKOCYTESUR in the last 72 hours.  Invalid input(s): APPERANCEUR    Imaging: No results found.   Medications:   . sodium chloride Stopped (05/15/19 2045)  . ampicillin-sulbactam (UNASYN) IV 3 g (05/18/19 0526)   . chlorhexidine  15 mL Mouth/Throat Q4H  . cloNIDine  0.2 mg Transdermal Weekly  . feeding supplement (NEPRO CARB STEADY)  237 mL Oral BID BM  . heparin  5,000 Units Subcutaneous Q8H   . influenza vaccine adjuvanted  0.5 mL Intramuscular Tomorrow-1000  . lidocaine (PF)  5 mL Intradermal Once  . multivitamin  1 tablet Oral QHS  . mupirocin ointment  1 application Nasal BID  . pantoprazole (PROTONIX) IV  40 mg Intravenous Q12H  . sodium chloride flush  3 mL Intravenous Q12H  . vitamin C  500 mg Oral BID   sodium chloride, acetaminophen **OR** acetaminophen, albuterol, iohexol  Assessment/ Plan:  78  y.o. African-American female  with end stage renal disease on hemodialysis, hypertension, diabetes mellitus type II, diabetic neuropathy who is admitted to [facility] on 05/11/2019 for [facility] MWF-1//TW84kg//[facility]//left arm AV graft  1.  End-stage renal disease - next HD om Friday  2.  Sepsis with altered mental status Blood cultures from 10/22 are positive for E Coli.  Urine culture positive for E. coli greater than 100,000 colonies CT negative for stroke Neurology team following improving  3.  Anemia of chronic kidney disease Lab Results  Component Value Date   HGB 10.1 (L) 05/17/2019  Continue low-dose Epogen with hemodialysis  4.  Secondary hyperparathyroidism Lab Results  Component Value Date   CALCIUM 9.1 05/17/2019   PHOS 7.6 (H) 05/17/2019   Monitor calcium and phosphorus closely  5.  Nutrition -Poor nutrition due to inability to take p.o. -Evaluation is continuing.    LOS: [place] 10/29/202012:28 PM  [place], [place]  Note: This note was prepared with Dragon dictation. Any transcription errors are unintentional

## 2019-05-18 NOTE — Progress Notes (Signed)
Barbara [name], is a 78 y.o. female, DOB - 1940/08/31, FXT:[id number]  Admit date - 05/11/2019   Admitting Physician Dustin Flock, MD  Outpatient Primary MD for the patient is [name], MD   LOS - 7  Subjective:  Awake and and more interactive but confused. Wants to eat  Review of Systems:   :unable to provide  Vitals:   Vitals:   05/17/19 2100 05/18/19 0437 05/18/19 1241 05/18/19 1317  BP: 124/65 (!) 138/48 (!) 113/54 (!) 144/60  Pulse: 80 82 88 88  Resp: 18 16  20   Temp: 97.7 F (36.5 C) 98.3 F (36.8 C) 98.6 F (37 C) 98.5 F (36.9 C)  TempSrc: Oral Oral Oral Oral  SpO2: 99% 99% 100% 98%  Weight:      Height:        Wt Readings from Last 3 Encounters:  05/17/19 74.3 kg  02/28/19 81.6 kg  02/07/19 81.6 kg     Intake/Output Summary (Last 24 hours) at 05/18/2019 1456 Last data filed at 05/18/2019 1300 Gross per 24 hour  Intake 203 ml  Output -  Net 203 ml    Physical Exam:   GENERAL: obsese chronically ill appearing  HEAD, EYES, EARS, NOSE AND THROAT: Atraumatic, normocephalic. Extraocular muscles are intact. Pupils equal and reactive to light. Sclerae anicteric. No conjunctival injection. No oro-pharyngeal erythema.  NECK: Supple. There is no jugular venous distention. No bruits, no lymphadenopathy, no thyromegaly.  HEART: Regular rate and rhythm,. No murmurs, no rubs, no clicks.  LUNGS: Clear to auscultation bilaterally. No rales or rhonchi. No wheezes.  ABDOMEN: Soft, flat, nontender, nondistended. Has good bowel sounds. No hepatosplenomegaly appreciated.  EXTREMITIES: No evidence of any cyanosis, clubbing, or peripheral edema.  +2 pedal and radial pulses bilaterally.  NEUROLOGIC: Patient awake and  confused SKIN: Moist and warm with no rashes appreciated.  Psych: Not anxious, depressed LN: No inguinal LN enlargement    Antibiotics   Anti-infectives (From admission, onward)   Start     Dose/Rate Route Frequency Ordered Stop   05/15/19 1800  Ampicillin-Sulbactam (UNASYN) 3 g in sodium chloride 0.9 % 100 mL IVPB     3 g 200 mL/hr over 30 Minutes Intravenous Every 12 hours 05/15/19 1343     05/15/19 1200  vancomycin (VANCOCIN) IVPB 1000 mg/200 mL premix  Status:  Discontinued     1,000 mg 200 mL/hr over 60  Minutes Intravenous Every M-W-F (Hemodialysis) 05/12/19 1420 05/12/19 1917   05/14/19 2330  metroNIDAZOLE (FLAGYL) IVPB 500 mg  Status:  Discontinued     500 mg 100 mL/hr over 60 Minutes Intravenous Every 8 hours 05/14/19 2318 05/15/19 1342   05/12/19 1800  ceFEPIme (MAXIPIME) 1 g in sodium chloride 0.9 % 100 mL IVPB  Status:  Discontinued     1 g 200 mL/hr over 30 Minutes Intravenous Every 24 hours 05/12/19 1420 05/15/19 1342   05/12/19 1600  vancomycin (VANCOCIN) IVPB 1000 mg/200 mL premix     1,000 mg 200 mL/hr over 60 Minutes Intravenous  Once 05/12/19 1420 05/12/19 1656   05/11/19 1530  cefTRIAXone (ROCEPHIN) 2 g in sodium chloride 0.9 % 100 mL IVPB  Status:  Discontinued     2 g 200 mL/hr over 30 Minutes Intravenous Every 24 hours 05/11/19 1520 05/12/19 0759   05/11/19 1530  azithromycin (ZITHROMAX) 500 mg in sodium chloride 0.9 % 250 mL IVPB  Status:  Discontinued     500 mg 250 mL/hr over 60 Minutes Intravenous Every 24 hours 05/11/19 1520 05/12/19 0759   05/11/19 1300  vancomycin (VANCOCIN) 500 mg in sodium chloride 0.9 % 100 mL IVPB     500 mg 100 mL/hr over 60 Minutes Intravenous  Once 05/11/19 1116 05/11/19 1539   05/11/19 1100  vancomycin (VANCOCIN) IVPB 1000 mg/200 mL premix     1,000 mg 200 mL/hr over 60 Minutes Intravenous  Once 05/11/19 1054 05/11/19 1534   05/11/19 1100  ceFEPIme (MAXIPIME) 2 g in sodium chloride 0.9 % 100 mL IVPB     2 g 200 mL/hr over 30  Minutes Intravenous  Once 05/11/19 1054 05/11/19 1219      Medications   Scheduled Meds: . chlorhexidine  15 mL Mouth/Throat Q4H  . cloNIDine  0.2 mg Transdermal Weekly  . feeding supplement (NEPRO CARB STEADY)  237 mL Oral BID BM  . heparin  5,000 Units Subcutaneous Q8H  . influenza vaccine adjuvanted  0.5 mL Intramuscular Tomorrow-1000  . lidocaine (PF)  5 mL Intradermal Once  . multivitamin  1 tablet Oral QHS  . mupirocin ointment  1 application Nasal BID  . pantoprazole (PROTONIX) IV  40 mg Intravenous Q12H  . sodium chloride flush  3 mL Intravenous Q12H  . vitamin C  500 mg Oral BID   Continuous Infusions: . sodium chloride Stopped (05/15/19 2045)  . ampicillin-sulbactam (UNASYN) IV Stopped (05/18/19 0647)   PRN Meds:.sodium chloride, acetaminophen **OR** acetaminophen, albuterol, iohexol   Data Review:   Micro Results Recent Results (from the past 240 hour(s))  Blood Culture (routine x 2)     Status: Abnormal   Collection Time: 05/11/19 11:21 AM   Specimen: BLOOD  Result Value Ref Range Status   Specimen Description BLOOD RIGHT ANTECUBITAL  Final   Special Requests   Final    BOTTLES DRAWN AEROBIC AND ANAEROBIC Blood Culture adequate volume   Culture  Setup Time   Final    GRAM NEGATIVE RODS ANAEROBIC BOTTLE ONLY CRITICAL RESULT CALLED TO, READ BACK BY AND VERIFIED WITH: JASON ROBBINS AT 1644 05/12/2019 KMP    Culture BACTEROIDES FRAGILIS BETA LACTAMASE POSITIVE  (A)  Final   Report Status 05/14/2019 FINAL  Final  Urine culture     Status: Abnormal   Collection Time: 05/11/19 11:21 AM   Specimen: Urine, Random  Result Value Ref Range Status   Specimen Description   Final    URINE, RANDOM Performed at  [facility], [address] [place], [place] [address]    Special Requests   Final    NONE Performed at [facility], [address] [place], [place] [address]    Culture >=100,000 COLONIES/mL ESCHERICHIA COLI (A)  Final   Report  Status 05/13/2019 FINAL  Final   Organism ID, Bacteria ESCHERICHIA COLI (A)  Final      Susceptibility   Escherichia coli - MIC*    AMPICILLIN <=2 SENSITIVE Sensitive     CEFAZOLIN <=4 SENSITIVE Sensitive     CEFTRIAXONE <=1 SENSITIVE Sensitive     CIPROFLOXACIN <=0.25 SENSITIVE Sensitive     GENTAMICIN <=1 SENSITIVE Sensitive     IMIPENEM <=0.25 SENSITIVE Sensitive     NITROFURANTOIN <=16 SENSITIVE Sensitive     TRIMETH/SULFA <=20 SENSITIVE Sensitive     AMPICILLIN/SULBACTAM <=2 SENSITIVE Sensitive     PIP/TAZO <=4 SENSITIVE Sensitive     Extended ESBL NEGATIVE Sensitive     * >=100,000 COLONIES/mL ESCHERICHIA COLI  Blood Culture ID Panel (Reflexed)     Status: None   Collection Time: 05/11/19 11:21 AM  Result Value Ref Range Status   Enterococcus species NOT DETECTED NOT DETECTED Final   Listeria monocytogenes NOT DETECTED NOT DETECTED Final   Staphylococcus species NOT DETECTED NOT DETECTED Final   Staphylococcus aureus (BCID) NOT DETECTED NOT DETECTED Final   Streptococcus species NOT DETECTED NOT DETECTED Final   Streptococcus agalactiae NOT DETECTED NOT DETECTED Final   Streptococcus pneumoniae NOT DETECTED NOT DETECTED Final   Streptococcus pyogenes NOT DETECTED NOT DETECTED Final   Acinetobacter baumannii NOT DETECTED NOT DETECTED Final   Enterobacteriaceae species NOT DETECTED NOT DETECTED Final   Enterobacter cloacae complex NOT DETECTED NOT DETECTED Final   Escherichia coli NOT DETECTED NOT DETECTED Final   Klebsiella oxytoca NOT DETECTED NOT DETECTED Final   Klebsiella pneumoniae NOT DETECTED NOT DETECTED Final   Proteus species NOT DETECTED NOT DETECTED Final   Serratia marcescens NOT DETECTED NOT DETECTED Final   Haemophilus influenzae NOT DETECTED NOT DETECTED Final   Neisseria meningitidis NOT DETECTED NOT DETECTED Final   Pseudomonas aeruginosa NOT DETECTED NOT DETECTED Final   Candida albicans NOT DETECTED NOT DETECTED Final   Candida glabrata NOT DETECTED NOT  DETECTED Final   Candida krusei NOT DETECTED NOT DETECTED Final   Candida parapsilosis NOT DETECTED NOT DETECTED Final   Candida tropicalis NOT DETECTED NOT DETECTED Final    Comment: Performed at [facility], [address] [place], [place] [address]  SARS Coronavirus 2 by RT PCR (hospital order, performed in [facility] hospital lab) Nasopharyngeal Nasopharyngeal Swab     Status: None   Collection Time: 05/11/19 12:01 PM   Specimen: Nasopharyngeal Swab  Result Value Ref Range Status   SARS Coronavirus 2 NEGATIVE NEGATIVE Final    Comment: (NOTE) If result is NEGATIVE SARS-CoV-2 target nucleic acids are NOT DETECTED. The SARS-CoV-2 RNA is generally detectable in upper and lower  respiratory specimens during the acute phase of infection. The lowest  concentration of SARS-CoV-2 viral copies this assay can detect is 250  copies / mL. A negative result does not preclude SARS-CoV-2 infection  and should not be used as the sole basis for treatment or other  patient management decisions.  A negative result may occur with  improper specimen collection / handling, submission of specimen other  than nasopharyngeal swab, presence of viral mutation(s) within the  areas targeted by this assay, and inadequate number of viral copies  (<  250 copies / mL). A negative result must be combined with clinical  observations, patient history, and epidemiological information. If result is POSITIVE SARS-CoV-2 target nucleic acids are DETECTED. The SARS-CoV-2 RNA is generally detectable in upper and lower  respiratory specimens dur ing the acute phase of infection.  Positive  results are indicative of active infection with SARS-CoV-2.  Clinical  correlation with patient history and other diagnostic information is  necessary to determine patient infection status.  Positive results do  not rule out bacterial infection or co-infection with other viruses. If result is PRESUMPTIVE POSTIVE SARS-CoV-2  nucleic acids MAY BE PRESENT.   A presumptive positive result was obtained on the submitted specimen  and confirmed on repeat testing.  While 2019 novel coronavirus  (SARS-CoV-2) nucleic acids may be present in the submitted sample  additional confirmatory testing may be necessary for epidemiological  and / or clinical management purposes  to differentiate between  SARS-CoV-2 and other Sarbecovirus currently known to infect humans.  If clinically indicated additional testing with an alternate test  methodology [phone]) is advised. The SARS-CoV-2 RNA is generally  detectable in upper and lower respiratory sp ecimens during the acute  phase of infection. The expected result is Negative. Fact Sheet for Patients:  StrictlyIdeas.no Fact Sheet for Healthcare Providers: BankingDealers.co.za This test is not yet approved or cleared by the [place] FDA and has been authorized for detection and/or diagnosis of SARS-CoV-2 by FDA under an Emergency Use Authorization (EUA).  This EUA will remain in effect (meaning this test can be used) for the duration of the COVID-19 declaration under Section 564(b)(1) of the Act, 21 U.S.C. section 360bbb-3(b)(1), unless the authorization is terminated or revoked sooner. Performed at [facility], [address] [place], [place] [address]   Blood Culture (routine x 2)     Status: None   Collection Time: 05/11/19  9:59 PM   Specimen: BLOOD  Result Value Ref Range Status   Specimen Description BLOOD BLOOD RIGHT HAND  Final   Special Requests   Final    BOTTLES DRAWN AEROBIC AND ANAEROBIC Blood Culture adequate volume   Culture   Final    NO GROWTH 5 DAYS Performed at [facility], [address] [place], [place] [address]    Report Status 05/16/2019 FINAL  Final  MRSA PCR Screening     Status: Abnormal   Collection Time: 05/12/19  7:01 PM   Specimen: Nasal Mucosa; Nasopharyngeal   Result Value Ref Range Status   MRSA by PCR POSITIVE (A) NEGATIVE Final    Comment:        The GeneXpert MRSA Assay (FDA approved for NASAL specimens only), is one component of a comprehensive MRSA colonization surveillance program. It is not intended to diagnose MRSA infection nor to guide or monitor treatment for MRSA infections. CRITICAL RESULT CALLED TO, READ BACK BY AND VERIFIED WITH: Jacqulynn Cadet AT 2103 05/12/2019 KMP Performed at [facility], [address], [place], [place] [address]     Radiology Reports Ct Abdomen Pelvis Wo Contrast  Result Date: 05/11/2019 CLINICAL DATA:  Abdominal pain, fever.  History of bladder cancer EXAM: CT ABDOMEN AND PELVIS WITHOUT CONTRAST TECHNIQUE: Multidetector CT imaging of the abdomen and pelvis was performed following the standard protocol without IV contrast. COMPARISON:  01/19/2018 FINDINGS: Lower chest: Small bilateral pleural effusions. Cardiomegaly. Bibasilar airspace opacities likely atelectasis. Hepatobiliary: No focal hepatic abnormality. Gallbladder unremarkable. Pancreas: No focal abnormality or ductal dilatation. Spleen: No focal abnormality.  Normal size. Adrenals/Urinary Tract: Severe bilateral hydroureteronephrosis, unchanged. Bladder wall is markedly thickened and stable since prior study. No visible adrenal lesion. Stomach/Bowel: Stomach, large and small bowel grossly unremarkable. Vascular/Lymphatic: Heavily calcified aorta and iliac vessels. Right common iliac lymph node again noted with a short axis diameter of 1.5 cm, stable. Right inguinal lymph node has a short axis diameter of 7 mm, stable. Reproductive: Prior hysterectomy.  No adnexal masses. Other: No free fluid or free air. Musculoskeletal: No acute bony abnormality. IMPRESSION: Continued marked bladder wall thickening and severe bilateral hydronephrosis. Findings similar to prior study. Heavily calcified aorta and iliac vessels. New small bilateral pleural  effusions. Bibasilar opacities, likely atelectasis. Stable mildly enlarged and borderline sized right common iliac and right inguinal lymph nodes. Electronically Signed   By: Rolm Baptise M.D.   On: 05/11/2019 13:51   Ct Head Wo Contrast  Result Date: 05/12/2019 CLINICAL DATA:  Altered mental status. EXAM: CT HEAD WITHOUT CONTRAST TECHNIQUE: Contiguous axial images were obtained from the base of the skull through the vertex without intravenous contrast. COMPARISON:  Head CT 05/11/2017 FINDINGS: Brain: Stable age related cerebral atrophy, ventriculomegaly and periventricular white matter disease. No extra-axial fluid collections are identified. No CT findings for acute hemispheric infarction or intracranial hemorrhage. No mass lesions. The brainstem and cerebellum are normal. Vascular: Stable vascular calcifications. No aneurysm or hyperdense vessels. Skull: No skull fracture or bone lesions. Sinuses/Orbits: The paranasal sinuses and mastoid air cells are clear. The globes are intact. Other: No scalp lesions or hematoma. IMPRESSION: 1. Stable age related cerebral atrophy, ventriculomegaly and periventricular white matter disease. 2. No acute intracranial findings or mass lesions. Electronically Signed   By: Marijo Sanes M.D.   On: 05/12/2019 08:51   Mr Brain Wo Contrast  Result Date: 05/14/2019 CLINICAL DATA:  Altered mental status (AMS), unclear cause. EXAM: MRI HEAD WITHOUT CONTRAST TECHNIQUE: Multiplanar, multiecho pulse sequences of the brain and surrounding structures were obtained without intravenous contrast. COMPARISON:  Head CT examinations 05/12/2019 and earlier FINDINGS: Brain: Multiple sequences are significantly motion degraded, limiting evaluation. A fast brain protocol was utilized. There is no convincing evidence of acute infarct on mildly motion degraded diffusion weighted imaging. No evidence of intracranial mass. No midline shift or extra-axial fluid collection. No chronic intracranial  blood products. 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 There is otherwise mild scattered and confluent T2/FLAIR hyperintensity within the cerebral white matter consistent with chronic small vessel ischemic disease. Small T2 hyperintense foci within the bilateral basal ganglia likely reflect a combination of prominent perivascular spaces and chronic lacunar infarcts. Mild generalized parenchymal atrophy. Vascular: Flow voids maintained within the proximal large arterial vessels. Skull and upper cervical spine: No focal marrow lesion identified on motion degraded imaging. Incompletely assessed cervical spondylosis. Sinuses/Orbits: Bilateral lens replacements. No significant paranasal sinus disease or mastoid effusion. IMPRESSION: 1. Motion degraded examination. 2. No evidence of acute intracranial abnormality. 3. Generalized parenchymal atrophy with chronic small vessel ischemic disease. 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 Electronically Signed   By: Kellie Simmering DO   On: 05/14/2019 17:23   Dg Chest Port 1 View  Result Date: 05/11/2019 CLINICAL DATA:  Sepsis EXAM: PORTABLE CHEST 1 VIEW COMPARISON:  05/10/2018 FINDINGS: Cardiac shadow is mildly prominent but accentuated by the portable technique. Further stenting in the left innominate vein and left subclavian vein  is noted. Right chest wall port is been removed in the interval. Diffuse vascular congestion is noted with mild interstitial edema. This may be related to a degree of volume overload related to the end-stage renal failure. No focal confluent infiltrate is seen. No sizable effusion is noted. No bony abnormality is noted. IMPRESSION: Vascular congestion likely related to the known renal failure and volume overload. No other focal abnormality is seen. Electronically Signed   By: Inez Catalina  M.D.   On: 05/11/2019 12:11     CBC Recent Labs  Lab 05/13/19 0650 05/17/19 0930  WBC 13.2* 9.1  HGB 10.9* 10.1*  HCT 35.3* 33.6*  PLT 180 282  MCV 98.9 100.6*  MCH 30.5 30.2  MCHC 30.9 30.1  RDW 13.4 13.9    Chemistries  Recent Labs  Lab 05/13/19 0650 05/15/19 2123 05/17/19 0930  NA 139 142 146*  K 3.8 4.4 4.7  CL 98 100 101  CO2 23 24 23   GLUCOSE 117* 99 101*  BUN 33* 58* 107*  CREATININE 7.39* 8.84* 12.97*  CALCIUM 8.9 9.3 9.1  MG  --  2.1  --    ------------------------------------------------------------------------------------------------------------------ estimated creatinine clearance is 3.5 mL/min (A) (by C-G formula based on SCr of 12.97 mg/dL (H)). ------------------------------------------------------------------------------------------------------------------ No results for input(s): HGBA1C in the last 72 hours. ------------------------------------------------------------------------------------------------------------------ No results for input(s): CHOL, HDL, LDLCALC, TRIG, CHOLHDL, LDLDIRECT in the last 72 hours. ------------------------------------------------------------------------------------------------------------------ No results for input(s): TSH, T4TOTAL, T3FREE, THYROIDAB in the last 72 hours.  Invalid input(s): FREET3 ------------------------------------------------------------------------------------------------------------------ No results for input(s): VITAMINB12, FOLATE, FERRITIN, TIBC, IRON, RETICCTPCT in the last 72 hours.  Coagulation profile No results for input(s): INR, PROTIME in the last 168 hours.  No results for input(s): DDIMER in the last 72 hours.  Cardiac Enzymes No results for input(s): CKMB, TROPONINI, MYOGLOBIN in the last 168 hours.  Invalid input(s): CK ------------------------------------------------------------------------------------------------------------------ Invalid input(s): POCBNP    Assessment &  Plan   IMPRESSION AND PLAN: Patient 78 year old presenting with abdominal pain confusion and hypoxia  *Bacteroids fragilis bacteremia Likely GI source but patient has no acute findings on CT abd other than thickened urinary bladder with h/o bladder cancer. No fistulas or abscesses On IV Unasyn. Consult ID  *Community-acquired pneumonia.  Has been on IV antibiotics.  Improved  *E. coli UTI. On IV Unasyn  * Acute encephalopathy  No significant improvement Mri shows no acute abnormality  eeg pending  appreciate neurology and psychiatry input Slowly improving  *  End-stage renal disease nephrology has been consulted for hemodialysis continue PhosLo and Sensipar  *  Diabetes type 2 continue sliding-scale insulin  *  Sleep apnea CPAP at bedtime  *  GERD Protonix IV  *  hypertension hold bp meds on clondine tts     Code Status Orders  (From admission, onward)         Start     Ordered   05/11/19 2137  Full code  Continuous     05/11/19 2136        Code Status History    Date Active Date Inactive Code Status Order ID Comments User Context   07/27/2018 1819 07/30/2018 1640 Full Code [id number]  Sela Hua, MD Inpatient   05/10/2018 1831 05/13/2018 2249 Full Code [id number]  Hillary Bow, MD ED   01/24/2017 2348 01/29/2017 1614 Full Code [id number]  [place], [place], DO Inpatient   11/19/2016 1528 11/23/2016 2156 Full Code [id number]  Loletha Grayer, MD Inpatient   06/05/2016 0108 06/08/2016 1345 Full Code  [id number]  Lance Coon, MD Inpatient   05/12/2016 1432 05/15/2016 1746 Full Code [id number]  Lytle Butte, MD Inpatient   05/04/2016 1455 05/09/2016 1828 Full Code [id number]  Epifanio Lesches, MD ED   04/17/2016 1336 04/20/2016 2041 Full Code [id number]  Demetrios Loll, MD Inpatient   03/04/2016 1358 03/10/2016 1440 Full Code [id number]  Hillary Bow, MD ED   01/21/2016 2115 01/27/2016 1801 Full Code [id number]  Edwin Dada, MD Inpatient   01/20/2016 1841  01/21/2016 2115 Full Code [id number]  Theodoro Grist, MD Inpatient   12/18/2015 0214 12/23/2015 1651 Full Code [id number]  Saundra Shelling, MD Inpatient   Advance Care Planning Activity     Consults  Neurology, Palliative care, Nephrology  DVT Prophylaxis  Heparin  Lab Results  Component Value Date   PLT 282 05/17/2019   Time Spent in minutes  87mn  SLeia AlfSudini M.D on 05/18/2019 at 2:56 PM  Between 7am to 6pm - Pager - [phone]  After 6pm go to [email or web address] - [email or web address]   Office  [phone]

## 2019-05-18 NOTE — Progress Notes (Signed)
Speech Language Pathology Treatment: Dysphagia  Patient Details Name: Barbara [name] MRN: [id number] DOB: 07/13/1941 Today's Date: 05/18/2019 Time: 0865-7846 SLP Time Calculation (min) (ACUTE ONLY): 50 min  Assessment / Plan / Recommendation Clinical Impression  Pt appears to present w/ Much Improved Cognitive status and is engaging more w/ others in the room; now on an oral diet per MD. Pt continues to present w/ Moderate+ confusion and requires Moderate verbal/tactile cues for follow through w/ tasks -- pt is not eager to participate at times in tasks.  During this lunch meal, pt exhibited adequate pharyngeal phase swallowing of thin liquids via straw, cup w/ No overt s/s of aspiration noted; no coughing or decline in vocal quality or respiratory status during/post trials occurred. However, during the oral phase, bolus management and timely A-P transfer of soft solids was impaired -- this seemed to be most impacted by her declined Cognitive awareness during task and her easy distraction and talking during trials. W/ verbal cues to attend to po tasks, and Moderate encouragement to take just bites each of mashed potatoes and few other foods, she consumed trials at the meal. Oral phase was lengthy as she mashed and chewed on the increased textured foods w/ the few Dentition she had. (Pt's partial, Denture plate is at home per Husband). SLP encouraged alternating foods and liquids and lingual sweeping to help clear mouth during oral intake. No oral phase deficits were noted w/ the trials of thin liquids and ice cream. She answered Basic questions re: self but was much more distracted w/ declined thought organization and verbal output. Mod guidance/cues from SLP during po tasks required. Pt encouraged to feed herself but she refused. She appears at reduced risk for aspiration w/ Supervision and support at meals at this time.  Recommend a more Minced foods diet w/ thin liquids via cup/straw w/ Supervision;  aspiration precautions; Pills in Puree for safer swallowing; support at all meals - tray setup, positioning upright. This would be an advantageous diet consistency for pt at discharge for easier mastication secondary to missing Dentition. Recommend ST f/u at discharge (post acute illness) for Cognitive-linguistic assessment d/t pt's decline noted currently; recommend f/u w/ Neurology for formal Cognitive assessment, management. NSG updated.     HPI HPI: Pt is a 78 y.o. female with a known history of multiple medical issues including bladder Ca, end-stage renal disease, HD, chronic kidney disease stage IV, COPD, coronary artery disease, diabetes type 2, GERD, celiac dis., sleep apnea, on O2 at home who was seen in the emergency room with complaint of abdominal pain and shortness of breath.  She was previously fairly independent w/ ADLs at home going to HD txs weekly.  During this admission, pt has exhibited increased confusion, nonverbal, and outbursts per staff reports. She has been NPO.  Noted CXR: vascular congestion, no other acute issuse; MRI: Generalized parenchymal atrophy with chronic small vessel ishemic disease.       SLP Plan  Continue with current plan of care       Recommendations  Diet recommendations: Dysphagia 2 (fine chop);Thin liquid Liquids provided via: Cup;Straw(monitor) Medication Administration: Crushed with puree(for safer swallowing if needed (vs Whole)) Supervision: Staff to assist with self feeding;Full supervision/cueing for compensatory strategies Compensations: Minimize environmental distractions;Slow rate;Small sips/bites;Lingual sweep for clearance of pocketing;Multiple dry swallows after each bite/sip;Follow solids with liquid Postural Changes and/or Swallow Maneuvers: Seated upright 90 degrees;Upright 30-60 min after meal  General recommendations: (Dietician f/u) Oral Care Recommendations: Oral care BID;Oral care before and after  PO;Staff/trained caregiver to provide oral care Follow up Recommendations: Skilled Nursing facility(TBD) SLP Visit Diagnosis: Dysphagia, oropharyngeal phase (R13.12)(Cognitive decline) Plan: Continue with current plan of care       [place], [place], CCC-SLP Addilynne Olheiser 05/18/2019, 3:22 PM

## 2019-05-18 NOTE — Consult Note (Signed)
NAME: Barbara [name]  DOB: Nov 21, 1940  MRN: [id number]  Date/Time: 05/18/2019 1:08 PM  REQUESTING PROVIDER: Dr. Darvin Neighbours Subjective:  REASON FOR CONSULT: Bacteroides bacteremia ?Patient is a poor historian.  Chart reviewed Barbara [name] is a 78 y.o. female with a history of end-stage renal disease, COPD, coronary artery disease, diabetes mellitus, GERD, sleep apnea, invasive bladder cancer diagnosed in July 2016  Status post TURBT, followed by palliative gemcitabine and concurrent radiation complicated by marked bladder wall thickening and bilateral hydronephroureterosis and started on hemodialysis in 2018.  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  Last biopsy March 2019 and followed by urologist.  Last appointment with urologist in August 2020 presented to the ED by EMS on 05/11/2019 with weakness, altered mental status, shortness of breath and abdominal pain. The ED her vitals were temperature of 102.7, blood pressure of 198/76, heart rate of 91.  Blood cultures were sent.  WBC was 13.6, hemoglobin 11.3 and platelet was 199.Creatinine was 7.49. CT abdomen and pelvis revealed bibasilar airspace opacities likely atelectasis, severe bilateral hydroureteronephrosis, bladder wall markedly thickened but stable since prior study.  Patient was started for treatment for community onset pneumonia with IV ceftriaxone and azithromycin.  Blood cultures came back positive for Bacteroides.  Patient's antibiotics have been switched to Unasyn.  I been asked to see the patient for the same.  Patient was also seen by neurologist for altered mental status and an EEG was done which revealed encephalopathy.  The CT head that was done on 05/12/2019 showed stable age-related cerebral atrophy with ventriculomegaly and periventricular white matter disease.  MRI of the brain did not reveal any acute changes. Patient is awake but confused and had no history is available from her.  Past Medical History:  Diagnosis Date  .  Adult celiac disease   . Anemia associated with chronic renal failure 2017   blood transfusion last week 10/17  . Arthritis   . Asthma   . Cancer ([place]) 2017   bladder  . Chronic kidney disease   . 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  dr. Candiss Norse and dr. Holley Raring follow her  . COPD (chronic obstructive pulmonary disease) ([place])   . Coronary artery disease   . Diabetes mellitus without complication ([place])   . Dialysis patient ([place])    Tues, Thurs, Sat  . Dyspnea    with exertion  . Elevated lipids   . GERD (gastroesophageal reflux disease)   . Hematuria   . Hypertension   . Lower back pain   . Neuropathy   . Oxygen dependent    at hs  . Personal history of chemotherapy 2017   bladder ca  . Personal history of radiation therapy 2017   bladder ca  . Sleep apnea    uses cpap  . Urinary obstruction 01/2016    Past Surgical History:  Procedure Laterality Date  . A/V SHUNT INTERVENTION N/A 05/12/2018   Procedure: A/V SHUNT INTERVENTION;  Surgeon: Algernon Huxley, MD;  Location: [facility];  Service: Cardiovascular;  Laterality: N/A;  . A/V SHUNT INTERVENTION Left 07/29/2018   Procedure: A/V SHUNT INTERVENTION;  Surgeon: Katha Cabal, MD;  Location: [facility];  Service: Cardiovascular;  Laterality: Left;  . ABDOMINAL HYSTERECTOMY    . AV FISTULA PLACEMENT Left 02/17/2017   Procedure: INSERTION OF ARTERIOVENOUS (AV) GORE-TEX GRAFT ARM(BRACHIAL AXILLARY);  Surgeon: Katha Cabal, MD;  Location: [facility];  Service: Vascular;  Laterality: Left;  .  CYSTOSCOPY W/ RETROGRADES Bilateral 02/17/2016   Procedure: CYSTOSCOPY WITH RETROGRADE PYELOGRAM;  Surgeon: Hollice Espy, MD;  Location: [facility];  Service: Urology;  Laterality: Bilateral;  . CYSTOSCOPY W/ RETROGRADES Bilateral 10/12/2016   Procedure: CYSTOSCOPY WITH RETROGRADE PYELOGRAM;  Surgeon: Hollice Espy, MD;  Location: [facility];  Service: Urology;  Laterality: Bilateral;  . CYSTOSCOPY W/  RETROGRADES Bilateral 06/09/2017   Procedure: CYSTOSCOPY WITH RETROGRADE PYELOGRAM;  Surgeon: Hollice Espy, MD;  Location: [facility];  Service: Urology;  Laterality: Bilateral;  . CYSTOSCOPY W/ RETROGRADES Bilateral 10/07/2017   Procedure: CYSTOSCOPY WITH RETROGRADE PYELOGRAM;  Surgeon: Hollice Espy, MD;  Location: [facility];  Service: Urology;  Laterality: Bilateral;  . CYSTOSCOPY W/ URETERAL STENT PLACEMENT Left 05/12/2016   Procedure: CYSTOSCOPY WITH STENT REPLACEMENT;  Surgeon: Hollice Espy, MD;  Location: [facility];  Service: Urology;  Laterality: Left;  . CYSTOSCOPY W/ URETERAL STENT PLACEMENT Left 10/12/2016   Procedure: CYSTOSCOPY WITH STENT REPLACEMENT;  Surgeon: Hollice Espy, MD;  Location: [facility];  Service: Urology;  Laterality: Left;  . CYSTOSCOPY WITH BIOPSY N/A 06/09/2017   Procedure: CYSTOSCOPY WITH BLADDER BIOPSY;  Surgeon: Hollice Espy, MD;  Location: [facility];  Service: Urology;  Laterality: N/A;  . CYSTOSCOPY WITH STENT PLACEMENT Left 01/21/2016   Procedure: CYSTOSCOPY WITH double J STENT PLACEMENT;  Surgeon: Franchot Gallo, MD;  Location: [facility];  Service: Urology;  Laterality: Left;  . CYSTOSCOPY WITH STENT PLACEMENT Right 10/12/2016   Procedure: CYSTOSCOPY WITH STENT PLACEMENT;  Surgeon: Hollice Espy, MD;  Location: [facility];  Service: Urology;  Laterality: Right;  . DIALYSIS/PERMA CATHETER INSERTION N/A 11/20/2016   Procedure: Dialysis/Perma Catheter Insertion;  Surgeon: Algernon Huxley, MD;  Location: [facility];  Service: Cardiovascular;  Laterality: N/A;  . DIALYSIS/PERMA CATHETER REMOVAL N/A 03/30/2017   Procedure: DIALYSIS/PERMA CATHETER REMOVAL;  Surgeon: Katha Cabal, MD;  Location: [facility];  Service: Cardiovascular;  Laterality: N/A;  . KIDNEY SURGERY  01/21/2016   IR NEPHROSTOMY PLACEMENT LEFT   . PERIPHERAL VASCULAR CATHETERIZATION N/A 04/07/2016   Procedure: Glori Luis Cath Insertion;  Surgeon: Katha Cabal, MD;  Location:  [facility];  Service: Cardiovascular;  Laterality: N/A;  . PERIPHERAL VASCULAR THROMBECTOMY Left 06/02/2017   Procedure: PERIPHERAL VASCULAR THROMBECTOMY;  Surgeon: Algernon Huxley, MD;  Location: [facility];  Service: Cardiovascular;  Laterality: Left;  . PERIPHERAL VASCULAR THROMBECTOMY Left 01/28/2018   Procedure: PERIPHERAL VASCULAR THROMBECTOMY;  Surgeon: Katha Cabal, MD;  Location: [facility];  Service: Cardiovascular;  Laterality: Left;  . PERIPHERAL VASCULAR THROMBECTOMY Left 03/14/2018   Procedure: PERIPHERAL VASCULAR THROMBECTOMY;  Surgeon: Algernon Huxley, MD;  Location: [facility];  Service: Cardiovascular;  Laterality: Left;  . PERIPHERAL VASCULAR THROMBECTOMY Left 03/16/2018   Procedure: PERIPHERAL VASCULAR THROMBECTOMY;  Surgeon: Katha Cabal, MD;  Location: [facility];  Service: Cardiovascular;  Laterality: Left;  . PORTA CATH REMOVAL N/A 02/07/2019   Procedure: PORTA CATH REMOVAL;  Surgeon: Katha Cabal, MD;  Location: [facility];  Service: Cardiovascular;  Laterality: N/A;  . PORTACATH PLACEMENT Right   . ROTATOR CUFF REPAIR Left   . TEMPORARY DIALYSIS CATHETER N/A 05/11/2018   Procedure: TEMPORARY DIALYSIS CATHETER;  Surgeon: Katha Cabal, MD;  Location: [facility];  Service: Cardiovascular;  Laterality: N/A;  . TEMPORARY DIALYSIS CATHETER Left 07/27/2018   Procedure: TEMPORARY DIALYSIS CATHETER;  Surgeon: Katha Cabal, MD;  Location: [facility];  Service: Cardiovascular;  Laterality: Left;  . TRANSURETHRAL RESECTION OF BLADDER TUMOR N/A 02/17/2016   Procedure: TRANSURETHRAL RESECTION OF BLADDER TUMOR (TURBT)-LARGE;  Surgeon: Hollice Espy, MD;  Location: [facility];  Service: Urology;  Laterality: N/A;  . TRANSURETHRAL RESECTION OF BLADDER TUMOR N/A 10/07/2017   Procedure: TRANSURETHRAL RESECTION OF BLADDER TUMOR (TURBT)-small;  Surgeon: Hollice Espy, MD;  Location: [facility];   Service: Urology;  Laterality: N/A;  . URETEROSCOPY Left 02/17/2016   Procedure: URETEROSCOPY;  Surgeon: Hollice Espy, MD;  Location: [facility];  Service: Urology;  Laterality: Left;  . URETEROSCOPY Right 10/12/2016   Procedure: URETEROSCOPY;  Surgeon: Hollice Espy, MD;  Location: [facility];  Service: Urology;  Laterality: Right;    Social History   Socioeconomic History  . Marital status: Married    Spouse name: Not on file  . Number of children: Not on file  . Years of education: Not on file  . Highest education level: Not on file  Occupational History  . Occupation: retired  Scientific laboratory technician  . Financial resource strain: Not on file  . Food insecurity    Worry: Not on file    Inability: Not on file  . Transportation needs    Medical: Not on file    Non-medical: Not on file  Tobacco Use  . Smoking status: Former Smoker    Types: Cigarettes    Quit date: 07/20/2002    Years since quitting: 16.8  . Smokeless tobacco: Never Used  . Tobacco comment: 01/22/2016   "  quit smoking many years ago "  Substance and Sexual Activity  . Alcohol use: No  . Drug use: No  . Sexual activity: Never  Lifestyle  . Physical activity    Days per week: Not on file    Minutes per session: Not on file  . Stress: Not on file  Relationships  . Social Herbalist on phone: Not on file    Gets together: Not on file    Attends religious service: Not on file    Active member of club or organization: Not on file    Attends meetings of clubs or organizations: Not on file    Relationship status: Not on file  . Intimate partner violence    Fear of current or ex partner: Not on file    Emotionally abused: Not on file    Physically abused: Not on file    Forced sexual activity: Not on file  Other Topics Concern  . Not on file  Social History Narrative  . Not on file    Family History  Problem Relation Age of Onset  . Diabetes Mother   . Cancer Father   . Breast cancer Neg Hx   . Kidney  disease Neg Hx    Allergies  Allergen Reactions  . Glipizide Er Other (See Comments)    Hypoglycemia   . Percocet [Oxycodone-Acetaminophen] Hives    ? Current Facility-Administered Medications  Medication Dose Route Frequency Provider Last Rate Last Dose  . 0.9 %  sodium chloride infusion   Intravenous PRN Dustin Flock, MD   Stopped at 05/15/19 2045  . acetaminophen (TYLENOL) tablet 650 mg  650 mg Oral Q4H PRN Dustin Flock, MD       Or  . acetaminophen (TYLENOL) suppository 650 mg  650 mg Rectal Q4H PRN Dustin Flock, MD   650 mg at 05/12/19 2297  . albuterol (PROVENTIL) (2.5 MG/3ML) 0.083% nebulizer solution 2.5 mg  2.5 mg Inhalation  Q6H PRN Dustin Flock, MD      . Ampicillin-Sulbactam (UNASYN) 3 g in sodium chloride 0.9 % 100 mL IVPB  3 g Intravenous Q12H Ellington, Abby K, RPH 200 mL/hr at 05/18/19 0526 3 g at 05/18/19 0526  . chlorhexidine (PERIDEX) 0.12 % solution 15 mL  15 mL Mouth/Throat Q4H Lang Snow, NP   15 mL at 05/18/19 1246  . cloNIDine (CATAPRES - Dosed in mg/24 hr) patch 0.2 mg  0.2 mg Transdermal Weekly Dustin Flock, MD   0.2 mg at 05/14/19 0837  . feeding supplement (NEPRO CARB STEADY) liquid 237 mL  237 mL Oral BID BM Sudini, Srikar, MD   237 mL at 05/18/19 1244  . heparin injection 5,000 Units  5,000 Units Subcutaneous Q8H Dustin Flock, MD   5,000 Units at 05/18/19 0523  . influenza vaccine adjuvanted (FLUAD) injection 0.5 mL  0.5 mL Intramuscular Tomorrow-1000 Dustin Flock, MD      . iohexol (OMNIPAQUE) 300 MG/ML solution 100 mL  100 mL Intravenous Once PRN Merlyn Lot, MD      . lidocaine (PF) (XYLOCAINE) 1 % injection 5 mL  5 mL Intradermal Once Merlyn Lot, MD      . multivitamin (RENA-VIT) tablet 1 tablet  1 tablet Oral QHS Sudini, Srikar, MD      . mupirocin ointment (BACTROBAN) 2 % 1 application  1 application Nasal BID Dustin Flock, MD   1 application at 65/46/50 1246  . pantoprazole (PROTONIX) injection 40 mg  40  mg Intravenous Q12H Dustin Flock, MD   40 mg at 05/18/19 0941  . sodium chloride flush (NS) 0.9 % injection 3 mL  3 mL Intravenous Q12H Dustin Flock, MD   3 mL at 05/18/19 0942  . vitamin C (ASCORBIC ACID) tablet 500 mg  500 mg Oral BID Hillary Bow, MD         Abtx:  Anti-infectives (From admission, onward)   Start     Dose/Rate Route Frequency Ordered Stop   05/15/19 1800  Ampicillin-Sulbactam (UNASYN) 3 g in sodium chloride 0.9 % 100 mL IVPB     3 g 200 mL/hr over 30 Minutes Intravenous Every 12 hours 05/15/19 1343     05/15/19 1200  vancomycin (VANCOCIN) IVPB 1000 mg/200 mL premix  Status:  Discontinued     1,000 mg 200 mL/hr over 60 Minutes Intravenous Every M-W-F (Hemodialysis) 05/12/19 1420 05/12/19 1917   05/14/19 2330  metroNIDAZOLE (FLAGYL) IVPB 500 mg  Status:  Discontinued     500 mg 100 mL/hr over 60 Minutes Intravenous Every 8 hours 05/14/19 2318 05/15/19 1342   05/12/19 1800  ceFEPIme (MAXIPIME) 1 g in sodium chloride 0.9 % 100 mL IVPB  Status:  Discontinued     1 g 200 mL/hr over 30 Minutes Intravenous Every 24 hours 05/12/19 1420 05/15/19 1342   05/12/19 1600  vancomycin (VANCOCIN) IVPB 1000 mg/200 mL premix     1,000 mg 200 mL/hr over 60 Minutes Intravenous  Once 05/12/19 1420 05/12/19 1656   05/11/19 1530  cefTRIAXone (ROCEPHIN) 2 g in sodium chloride 0.9 % 100 mL IVPB  Status:  Discontinued     2 g 200 mL/hr over 30 Minutes Intravenous Every 24 hours 05/11/19 1520 05/12/19 0759   05/11/19 1530  azithromycin (ZITHROMAX) 500 mg in sodium chloride 0.9 % 250 mL IVPB  Status:  Discontinued     500 mg 250 mL/hr over 60 Minutes Intravenous Every 24 hours 05/11/19 1520 05/12/19 0759   05/11/19 1300  vancomycin (VANCOCIN) 500 mg in sodium chloride 0.9 % 100 mL IVPB     500 mg 100 mL/hr over 60 Minutes Intravenous  Once 05/11/19 1116 05/11/19 1539   05/11/19 1100  vancomycin (VANCOCIN) IVPB 1000 mg/200 mL premix     1,000 mg 200 mL/hr over 60 Minutes Intravenous   Once 05/11/19 1054 05/11/19 1534   05/11/19 1100  ceFEPIme (MAXIPIME) 2 g in sodium chloride 0.9 % 100 mL IVPB     2 g 200 mL/hr over 30 Minutes Intravenous  Once 05/11/19 1054 05/11/19 1219      REVIEW OF SYSTEMS:  Not available because of confusion. Objective:  VITALS:  BP (!) 113/54 (BP Location: Right Leg)   Pulse 88   Temp 98.6 F (37 C) (Oral)   Resp 16   Ht 5' 8"  (1.727 m)   Wt 74.3 kg   SpO2 100%   BMI 24.91 kg/m  PHYSICAL EXAM:  General: Awake, alert, says a few words but incoherent and confused Head: Normocephalic, without obvious abnormality, atraumatic. Eyes: Divergent strabismus  ENT Nares normal. No drainage or sinus tenderness. Cannot examine the oral cavity she does not follow commands Neck: Supple, . Back: Did not examine Lungs: Bilateral air entry Heart: S1-S2 Abdomen: Soft, suprapubic firmness present Extremities: Left AV fistula Skin: No rashes or lesions. Or bruising Lymph: Cervical, supraclavicular normal. Neurologic: Spontaneously moves limbs Pertinent Labs Lab Results CBC    Component Value Date/Time   WBC 9.1 05/17/2019 0930   RBC 3.34 (L) 05/17/2019 0930   HGB 10.1 (L) 05/17/2019 0930   HGB 10.2 (L) 08/07/2012 0433   HCT 33.6 (L) 05/17/2019 0930   HCT 30.7 (L) 08/07/2012 0433   PLT 282 05/17/2019 0930   PLT 294 08/07/2012 0433   MCV 100.6 (H) 05/17/2019 0930   MCV 85 08/07/2012 0433   MCH 30.2 05/17/2019 0930   MCHC 30.1 05/17/2019 0930   RDW 13.9 05/17/2019 0930   RDW 13.7 08/07/2012 0433   LYMPHSABS 0.8 05/11/2019 1055   LYMPHSABS 1.6 08/07/2012 0433   MONOABS 0.7 05/11/2019 1055   MONOABS 0.4 08/07/2012 0433   EOSABS 0.0 05/11/2019 1055   EOSABS 0.0 08/07/2012 0433   BASOSABS 0.0 05/11/2019 1055   BASOSABS 0 11/22/2013 1935    CMP Latest Ref Rng & Units 05/17/2019 05/15/2019 05/13/2019  Glucose 70 - 99 mg/dL 101(H) 99 117(H)  BUN 8 - 23 mg/dL 107(H) 58(H) 33(H)  Creatinine 0.44 - 1.00 mg/dL 12.97(H) 8.84(H) 7.39(H)   Sodium 135 - 145 mmol/L 146(H) 142 139  Potassium 3.5 - 5.1 mmol/L 4.7 4.4 3.8  Chloride 98 - 111 mmol/L 101 100 98  CO2 22 - 32 mmol/L 23 24 23   Calcium 8.9 - 10.3 mg/dL 9.1 9.3 8.9  Total Protein 6.5 - 8.1 g/dL - - -  Total Bilirubin 0.3 - 1.2 mg/dL - - -  Alkaline Phos 38 - 126 U/L - - -  AST 15 - 41 U/L - - -  ALT 0 - 44 U/L - - -      Microbiology: Recent Results (from the past 240 hour(s))  Blood Culture (routine x 2)     Status: Abnormal   Collection Time: 05/11/19 11:21 AM   Specimen: BLOOD  Result Value Ref Range Status   Specimen Description BLOOD RIGHT ANTECUBITAL  Final   Special Requests   Final    BOTTLES DRAWN AEROBIC AND ANAEROBIC Blood Culture adequate volume   Culture  Setup Time   Final  GRAM NEGATIVE RODS ANAEROBIC BOTTLE ONLY CRITICAL RESULT CALLED TO, READ BACK BY AND VERIFIED WITH: JASON ROBBINS AT 1644 05/12/2019 KMP    Culture BACTEROIDES FRAGILIS BETA LACTAMASE POSITIVE  (A)  Final   Report Status 05/14/2019 FINAL  Final  Urine culture     Status: Abnormal   Collection Time: 05/11/19 11:21 AM   Specimen: Urine, Random  Result Value Ref Range Status   Specimen Description   Final    URINE, RANDOM Performed at [facility], [address], [place], [place] [address]    Special Requests   Final    NONE Performed at [facility], [address] [place], [place] [address]    Culture >=100,000 COLONIES/mL ESCHERICHIA COLI (A)  Final   Report Status 05/13/2019 FINAL  Final   Organism ID, Bacteria ESCHERICHIA COLI (A)  Final      Susceptibility   Escherichia coli - MIC*    AMPICILLIN <=2 SENSITIVE Sensitive     CEFAZOLIN <=4 SENSITIVE Sensitive     CEFTRIAXONE <=1 SENSITIVE Sensitive     CIPROFLOXACIN <=0.25 SENSITIVE Sensitive     GENTAMICIN <=1 SENSITIVE Sensitive     IMIPENEM <=0.25 SENSITIVE Sensitive     NITROFURANTOIN <=16 SENSITIVE Sensitive     TRIMETH/SULFA <=20 SENSITIVE Sensitive     AMPICILLIN/SULBACTAM  <=2 SENSITIVE Sensitive     PIP/TAZO <=4 SENSITIVE Sensitive     Extended ESBL NEGATIVE Sensitive     * >=100,000 COLONIES/mL ESCHERICHIA COLI  Blood Culture ID Panel (Reflexed)     Status: None   Collection Time: 05/11/19 11:21 AM  Result Value Ref Range Status   Enterococcus species NOT DETECTED NOT DETECTED Final   Listeria monocytogenes NOT DETECTED NOT DETECTED Final   Staphylococcus species NOT DETECTED NOT DETECTED Final   Staphylococcus aureus (BCID) NOT DETECTED NOT DETECTED Final   Streptococcus species NOT DETECTED NOT DETECTED Final   Streptococcus agalactiae NOT DETECTED NOT DETECTED Final   Streptococcus pneumoniae NOT DETECTED NOT DETECTED Final   Streptococcus pyogenes NOT DETECTED NOT DETECTED Final   Acinetobacter baumannii NOT DETECTED NOT DETECTED Final   Enterobacteriaceae species NOT DETECTED NOT DETECTED Final   Enterobacter cloacae complex NOT DETECTED NOT DETECTED Final   Escherichia coli NOT DETECTED NOT DETECTED Final   Klebsiella oxytoca NOT DETECTED NOT DETECTED Final   Klebsiella pneumoniae NOT DETECTED NOT DETECTED Final   Proteus species NOT DETECTED NOT DETECTED Final   Serratia marcescens NOT DETECTED NOT DETECTED Final   Haemophilus influenzae NOT DETECTED NOT DETECTED Final   Neisseria meningitidis NOT DETECTED NOT DETECTED Final   Pseudomonas aeruginosa NOT DETECTED NOT DETECTED Final   Candida albicans NOT DETECTED NOT DETECTED Final   Candida glabrata NOT DETECTED NOT DETECTED Final   Candida krusei NOT DETECTED NOT DETECTED Final   Candida parapsilosis NOT DETECTED NOT DETECTED Final   Candida tropicalis NOT DETECTED NOT DETECTED Final    Comment: Performed at [facility], [address] [place], [place] [address]  SARS Coronavirus 2 by RT PCR (hospital order, performed in [facility] hospital lab) Nasopharyngeal Nasopharyngeal Swab     Status: None   Collection Time: 05/11/19 12:01 PM   Specimen: Nasopharyngeal Swab  Result Value  Ref Range Status   SARS Coronavirus 2 NEGATIVE NEGATIVE Final    Comment: (NOTE) If result is NEGATIVE SARS-CoV-2 target nucleic acids are NOT DETECTED. The SARS-CoV-2 RNA is generally detectable in upper and lower  respiratory specimens during the acute phase of  infection. The lowest  concentration of SARS-CoV-2 viral copies this assay can detect is 250  copies / mL. A negative result does not preclude SARS-CoV-2 infection  and should not be used as the sole basis for treatment or other  patient management decisions.  A negative result may occur with  improper specimen collection / handling, submission of specimen other  than nasopharyngeal swab, presence of viral mutation(s) within the  areas targeted by this assay, and inadequate number of viral copies  (<250 copies / mL). A negative result must be combined with clinical  observations, patient history, and epidemiological information. If result is POSITIVE SARS-CoV-2 target nucleic acids are DETECTED. The SARS-CoV-2 RNA is generally detectable in upper and lower  respiratory specimens dur ing the acute phase of infection.  Positive  results are indicative of active infection with SARS-CoV-2.  Clinical  correlation with patient history and other diagnostic information is  necessary to determine patient infection status.  Positive results do  not rule out bacterial infection or co-infection with other viruses. If result is PRESUMPTIVE POSTIVE SARS-CoV-2 nucleic acids MAY BE PRESENT.   A presumptive positive result was obtained on the submitted specimen  and confirmed on repeat testing.  While 2019 novel coronavirus  (SARS-CoV-2) nucleic acids may be present in the submitted sample  additional confirmatory testing may be necessary for epidemiological  and / or clinical management purposes  to differentiate between  SARS-CoV-2 and other Sarbecovirus currently known to infect humans.  If clinically indicated additional testing with an  alternate test  methodology [phone]) is advised. The SARS-CoV-2 RNA is generally  detectable in upper and lower respiratory sp ecimens during the acute  phase of infection. The expected result is Negative. Fact Sheet for Patients:  StrictlyIdeas.no Fact Sheet for Healthcare Providers: BankingDealers.co.za This test is not yet approved or cleared by the [place] FDA and has been authorized for detection and/or diagnosis of SARS-CoV-2 by FDA under an Emergency Use Authorization (EUA).  This EUA will remain in effect (meaning this test can be used) for the duration of the COVID-19 declaration under Section 564(b)(1) of the Act, 21 U.S.C. section 360bbb-3(b)(1), unless the authorization is terminated or revoked sooner. Performed at [facility], [address] [place], [place] [address]   Blood Culture (routine x 2)     Status: None   Collection Time: 05/11/19  9:59 PM   Specimen: BLOOD  Result Value Ref Range Status   Specimen Description BLOOD BLOOD RIGHT HAND  Final   Special Requests   Final    BOTTLES DRAWN AEROBIC AND ANAEROBIC Blood Culture adequate volume   Culture   Final    NO GROWTH 5 DAYS Performed at [facility], [address] [place], [place] [address]    Report Status 05/16/2019 FINAL  Final  MRSA PCR Screening     Status: Abnormal   Collection Time: 05/12/19  7:01 PM   Specimen: Nasal Mucosa; Nasopharyngeal  Result Value Ref Range Status   MRSA by PCR POSITIVE (A) NEGATIVE Final    Comment:        The GeneXpert MRSA Assay (FDA approved for NASAL specimens only), is one component of a comprehensive MRSA colonization surveillance program. It is not intended to diagnose MRSA infection nor to guide or monitor treatment for MRSA infections. CRITICAL RESULT CALLED TO, READ BACK BY AND VERIFIED WITH: Jacqulynn Cadet AT 2103 05/12/2019 KMP Performed at [facility], [address] [place],  [address]  IMAGING RESULTS: CT abdomen and pelvis revealed continued marked bladder wall thickening and severe bilateral hydronephrosis. I have personally reviewed the films ? Impression/Recommendation ? ?Bacteroides bacteremia.  This is GI organism.  Not clear whether breach was for the bacteria to enter the blood.  The CT abdomen did not reveal any pathology in the intestines or colon.  Patient has chronic bladder wall thickening secondary to radiation cystitis for bladder malignancy.  Wonder whether there is a possibility of vesicocolonic fistula. Patient has E. coli in the urine culture.  Patient is currently on Unasyn.  First dose started on 05/15/2019.  She will need need for total of 10 days.  As the Bacteroides beta-lactamase positive the other option would be metronidazole.  As long as the patient is in the hospital I would give her IV Unasyn at least for 5 to 7 days.  Then may be able to switch her to p.o. Flagyl.  Invasive bladder cancer status post TURBT, palliative chemotherapy and radiation.  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  Encephalopathy  End-stage renal disease on dialysis ___________________________________________________ Discussed the management with the requesting provider. Note:  This document was prepared using Dragon voice recognition software and may include unintentional dictation errors.

## 2019-05-19 ENCOUNTER — Other Ambulatory Visit: Payer: Medicare Other

## 2019-05-19 LAB — GLUCOSE, CAPILLARY: Glucose-Capillary: 101 mg/dL — ABNORMAL HIGH (ref 70–99)

## 2019-05-19 NOTE — Progress Notes (Signed)
[facility], [place] 05/19/19  Subjective:    More alert today and able to answer some questions Denies SOB Seen during hemodialysis.  Dialyzing seated in a chair with hoyer lift pad under her   HEMODIALYSIS FLOWSHEET:  Blood Flow Rate (mL/min): 300 mL/min Arterial Pressure (mmHg): -200 mmHg Venous Pressure (mmHg): 100 mmHg Transmembrane Pressure (mmHg): 50 mmHg Ultrafiltration Rate (mL/min): 140 mL/min Dialysate Flow Rate (mL/min): 800 ml/min Conductivity: Machine : 13.8 Conductivity: Machine : 13.8 Dialysis Fluid Bolus: Normal Saline Bolus Amount (mL): 200 mL     Objective:  Vital signs in last 24 hours:  Temp:  [97.6 F (36.4 C)-98.6 F (37 C)] 97.6 F (36.4 C) (10/30 1030) Pulse Rate:  [50-100] 80 (10/30 1145) Resp:  [14-27] 27 (10/30 1145) BP: (86-144)/(41-71) 113/41 (10/30 1145) SpO2:  [92 %-100 %] 100 % (10/30 0521)  Weight change:  Filed Weights   05/15/19 1215 05/15/19 1220 05/17/19 0945  Weight: 76.1 kg 76.1 kg 74.3 kg    Intake/Output:    Intake/Output Summary (Last 24 hours) at 05/19/2019 1211 Last data filed at 05/19/2019 0941 Gross per 24 hour  Intake 323 ml  Output -  Net 323 ml     Physical Exam: General:   no acute distress  HEENT  moist oral mucous membranes  Pulm/lungs  normal breathing effort, clear  CVS/Heart  no rub  Abdomen:   Soft, nontender  Extremities:  No peripheral edema  Neurologic:  Able to answer some simple questions  Skin:  Warm, dry  Access:  Left arm AV graft       Basic Metabolic Panel:  Recent Labs  Lab 05/13/19 0650 05/15/19 2123 05/17/19 0930  NA 139 142 146*  K 3.8 4.4 4.7  CL 98 100 101  CO2 23 24 23   GLUCOSE 117* 99 101*  BUN 33* 58* 107*  CREATININE 7.39* 8.84* 12.97*  CALCIUM 8.9 9.3 9.1  MG  --  2.1  --   PHOS  --   --  7.6*     CBC: Recent Labs  Lab 05/13/19 0650 05/17/19 0930  WBC 13.2* 9.1  HGB 10.9* 10.1*  HCT 35.3* 33.6*  MCV 98.9 100.6*  PLT  180 282      Lab Results  Component Value Date   HEPBSAG NON REACTIVE 05/12/2019      Microbiology:  Recent Results (from the past 240 hour(s))  Blood Culture (routine x 2)     Status: Abnormal   Collection Time: 05/11/19 11:21 AM   Specimen: BLOOD  Result Value Ref Range Status   Specimen Description BLOOD RIGHT ANTECUBITAL  Final   Special Requests   Final    BOTTLES DRAWN AEROBIC AND ANAEROBIC Blood Culture adequate volume   Culture  Setup Time   Final    GRAM NEGATIVE RODS ANAEROBIC BOTTLE ONLY CRITICAL RESULT CALLED TO, READ BACK BY AND VERIFIED WITH: JASON ROBBINS AT 1287 05/12/2019 K[facility]    Culture BACTEROIDES FRAGILIS BETA LACTAMASE POSITIVE  (A)  Final   Report Status 05/14/2019 FINAL  Final  Urine culture     Status: Abnormal   Collection Time: 05/11/19 11:21 AM   Specimen: Urine, Random  Result Value Ref Range Status   Specimen Description   Final    URINE, RANDOM Performed at [facility], [address], [place], [place] [address]    Special Requests   Final    NONE Performed at [facility], [address], [place], [place] [address]  Culture >=100,000 COLONIES/mL ESCHERICHIA COLI (A)  Final   Report Status 05/13/2019 FINAL  Final   Organism ID, Bacteria ESCHERICHIA COLI (A)  Final      Susceptibility   Escherichia coli - MIC*    AMPICILLIN <=2 SENSITIVE Sensitive     CEFAZOLIN <=4 SENSITIVE Sensitive     CEFTRIAXONE <=1 SENSITIVE Sensitive     CIPROFLOXACIN <=0.25 SENSITIVE Sensitive     GENTAMICIN <=1 SENSITIVE Sensitive     IMIPENEM <=0.25 SENSITIVE Sensitive     NITROFURANTOIN <=16 SENSITIVE Sensitive     TRIMETH/SULFA <=20 SENSITIVE Sensitive     AMPICILLIN/SULBACTAM <=2 SENSITIVE Sensitive     PIP/TAZO <=4 SENSITIVE Sensitive     Extended ESBL NEGATIVE Sensitive     * >=100,000 COLONIES/mL ESCHERICHIA COLI  Blood Culture ID Panel (Reflexed)     Status: None   Collection Time: 05/11/19 11:21 AM  Result Value Ref Range  Status   Enterococcus species NOT DETECTED NOT DETECTED Final   Listeria monocytogenes NOT DETECTED NOT DETECTED Final   Staphylococcus species NOT DETECTED NOT DETECTED Final   Staphylococcus aureus (BCID) NOT DETECTED NOT DETECTED Final   Streptococcus species NOT DETECTED NOT DETECTED Final   Streptococcus agalactiae NOT DETECTED NOT DETECTED Final   Streptococcus pneumoniae NOT DETECTED NOT DETECTED Final   Streptococcus pyogenes NOT DETECTED NOT DETECTED Final   Acinetobacter baumannii NOT DETECTED NOT DETECTED Final   Enterobacteriaceae species NOT DETECTED NOT DETECTED Final   Enterobacter cloacae complex NOT DETECTED NOT DETECTED Final   Escherichia coli NOT DETECTED NOT DETECTED Final   Klebsiella oxytoca NOT DETECTED NOT DETECTED Final   Klebsiella pneumoniae NOT DETECTED NOT DETECTED Final   Proteus species NOT DETECTED NOT DETECTED Final   Serratia marcescens NOT DETECTED NOT DETECTED Final   Haemophilus influenzae NOT DETECTED NOT DETECTED Final   Neisseria meningitidis NOT DETECTED NOT DETECTED Final   Pseudomonas aeruginosa NOT DETECTED NOT DETECTED Final   Candida albicans NOT DETECTED NOT DETECTED Final   Candida glabrata NOT DETECTED NOT DETECTED Final   Candida krusei NOT DETECTED NOT DETECTED Final   Candida parapsilosis NOT DETECTED NOT DETECTED Final   Candida tropicalis NOT DETECTED NOT DETECTED Final    Comment: Performed at [facility], [address] [place], [place] [address]  SARS Coronavirus 2 by RT PCR (hospital order, performed in [place] hospital lab) Nasopharyngeal Nasopharyngeal Swab     Status: None   Collection Time: 05/11/19 12:01 PM   Specimen: Nasopharyngeal Swab  Result Value Ref Range Status   SARS Coronavirus 2 NEGATIVE NEGATIVE Final    Comment: (NOTE) If result is NEGATIVE SARS-CoV-2 target nucleic acids are NOT DETECTED. The SARS-CoV-2 RNA is generally detectable in upper and lower  respiratory specimens during the acute  phase of infection. The lowest  concentration of SARS-CoV-2 viral copies this assay can detect is 250  copies / mL. A negative result does not preclude SARS-CoV-2 infection  and should not be used as the sole basis for treatment or other  patient management decisions.  A negative result may occur with  improper specimen collection / handling, submission of specimen other  than nasopharyngeal swab, presence of viral mutation(s) within the  areas targeted by this assay, and inadequate number of viral copies  (<250 copies / mL). A negative result must be combined with clinical  observations, patient history, and epidemiological information. If result is POSITIVE SARS-CoV-2 target nucleic acids are DETECTED. The SARS-CoV-2 RNA is generally detectable in  upper and lower  respiratory specimens dur ing the acute phase of infection.  Positive  results are indicative of active infection with SARS-CoV-2.  Clinical  correlation with patient history and other diagnostic information is  necessary to determine patient infection status.  Positive results do  not rule out bacterial infection or co-infection with other viruses. If result is PRESUMPTIVE POSTIVE SARS-CoV-2 nucleic acids MAY BE PRESENT.   A presumptive positive result was obtained on the submitted specimen  and confirmed on repeat testing.  While 2019 novel coronavirus  (SARS-CoV-2) nucleic acids may be present in the submitted sample  additional confirmatory testing may be necessary for epidemiological  and / or clinical management purposes  to differentiate between  SARS-CoV-2 and other Sarbecovirus currently known to infect humans.  If clinically indicated additional testing with an alternate test  methodology [id number]) is advised. The SARS-CoV-2 RNA is generally  detectable in upper and lower respiratory sp ecimens during the acute  phase of infection. The expected result is Negative. Fact Sheet for Patients:   StrictlyIdeas.no Fact Sheet for Healthcare Providers: BankingDealers.co.za This test is not yet approved or cleared by the [place] FDA and has been authorized for detection and/or diagnosis of SARS-CoV-2 by FDA under an Emergency Use Authorization (EUA).  This EUA will remain in effect (meaning this test can be used) for the duration of the COVID-19 declaration under Section 564(b)(1) of the Act, 21 U.S.C. section 360bbb-3(b)(1), unless the authorization is terminated or revoked sooner. Performed at [facility], [address] [place], [place] [address]   Blood Culture (routine x 2)     Status: None   Collection Time: 05/11/19  9:59 PM   Specimen: BLOOD  Result Value Ref Range Status   Specimen Description BLOOD BLOOD RIGHT HAND  Final   Special Requests   Final    BOTTLES DRAWN AEROBIC AND ANAEROBIC Blood Culture adequate volume   Culture   Final    NO GROWTH 5 DAYS Performed at [facility], [address] [place], [place] [address]    Report Status 05/16/2019 FINAL  Final  MRSA PCR Screening     Status: Abnormal   Collection Time: 05/12/19  7:01 PM   Specimen: Nasal Mucosa; Nasopharyngeal  Result Value Ref Range Status   MRSA by PCR POSITIVE (A) NEGATIVE Final    Comment:        The GeneXpert MRSA Assay (FDA approved for NASAL specimens only), is one component of a comprehensive MRSA colonization surveillance program. It is not intended to diagnose MRSA infection nor to guide or monitor treatment for MRSA infections. CRITICAL RESULT CALLED TO, READ BACK BY AND VERIFIED WITH: Jacqulynn Cadet AT 2103 05/12/2019 KMP Performed at [facility], [address] [place], [place] [address]     Coagulation Studies: No results for input(s): LABPROT, INR in the last 72 hours.  Urinalysis: No results for input(s): COLORURINE, LABSPEC, PHURINE, GLUCOSEU, HGBUR, BILIRUBINUR, KETONESUR, PROTEINUR,  UROBILINOGEN, NITRITE, LEUKOCYTESUR in the last 72 hours.  Invalid input(s): APPERANCEUR    Imaging: No results found.   Medications:   . sodium chloride Stopped (05/15/19 2045)  . ampicillin-sulbactam (UNASYN) IV 3 g (05/19/19 0543)   . chlorhexidine  15 mL Mouth/Throat Q4H  . cloNIDine  0.2 mg Transdermal Weekly  . feeding supplement (NEPRO CARB STEADY)  237 mL Oral BID BM  . heparin  5,000 Units Subcutaneous Q8H  . influenza vaccine adjuvanted  0.5 mL Intramuscular Tomorrow-1000  . lidocaine (PF)  5 mL Intradermal Once  . multivitamin  1 tablet Oral QHS  . mupirocin ointment  1 application Nasal BID  . pantoprazole (PROTONIX) IV  40 mg Intravenous Q12H  . sodium chloride flush  3 mL Intravenous Q12H  . vitamin C  500 mg Oral BID   sodium chloride, acetaminophen **OR** acetaminophen, albuterol, iohexol  Assessment/ Plan:  78 y.o. African-American female  with end stage renal disease on hemodialysis, hypertension, diabetes mellitus type II, diabetic neuropathy who is admitted to [facility] on 05/11/2019 for [facility] MWF-1//TW84kg//[facility]//left arm AV graft  1.  End-stage renal disease - Patient seen during dialysis Tolerating well   2.  Sepsis with altered mental status Blood cultures from 10/22 are positive for E Coli.  Urine culture positive for E. coli greater than 100,000 colonies CT negative for stroke Neurology team following Improving Iv UNASYN  3.  Anemia of chronic kidney disease Lab Results  Component Value Date   HGB 10.1 (L) 05/17/2019  Continue low-dose Epogen with hemodialysis  4.  Secondary hyperparathyroidism Lab Results  Component Value Date   CALCIUM 9.1 05/17/2019   PHOS 7.6 (H) 05/17/2019   Monitor calcium and phosphorus closely May be able to restart home dose of binders       LOS: [place] 10/30/202012:11 PM  [place], [place]  Note: This note was prepared  with Dragon dictation. Any transcription errors are unintentional

## 2019-05-19 NOTE — Progress Notes (Signed)
[place], is a 78 y.o. female, DOB - [birth year], LPF:[id number]  Admit date - 05/11/2019   Admitting Physician Dustin Flock, MD  Outpatient Primary MD for the patient is [name], MD   LOS - 8  Subjective:  More awake . Still confused. HD today Seemed anxious and said " I did not know my grand daughter is pregnant"  Review of Systems:   :unable to provide  Vitals:   Vitals:   05/19/19 1345 05/19/19 1355 05/19/19 1430 05/19/19 1434  BP: (!) 98/46 (!) 113/43 (!) 109/55   Pulse: 82 80 83 80  Resp: 19 (!) 24 (!) 24 (!) 23  Temp:  (!) 97.4 F (36.3 C)    TempSrc:  Axillary    SpO2:   100% 99%  Weight:      Height:        Wt Readings from Last 3 Encounters:  05/17/19 74.3 kg  02/28/19 81.6 kg  02/07/19 81.6 kg     Intake/Output Summary (Last 24 hours) at 05/19/2019 1621 Last data filed at 05/19/2019 1355 Gross per 24 hour  Intake 123 ml  Output -248 ml  Net 371 ml    Physical Exam:   GENERAL: obsese chronically ill appearing  HEAD, EYES, EARS, NOSE AND THROAT: Atraumatic, normocephalic. Extraocular muscles are intact. Pupils equal and reactive to light. Sclerae anicteric. No conjunctival injection. No oro-pharyngeal erythema.  NECK: Supple. There is no jugular venous distention. No bruits, no lymphadenopathy, no thyromegaly.  HEART: Regular rate and rhythm,. No murmurs, no rubs, no clicks.  LUNGS: Clear to auscultation bilaterally. No rales or rhonchi. No wheezes.  ABDOMEN: Soft, flat, nontender, nondistended. Has good bowel sounds. No hepatosplenomegaly appreciated.  EXTREMITIES: No evidence of any cyanosis, clubbing, or peripheral edema.  +2 pedal and radial pulses bilaterally.  NEUROLOGIC: Patient awake  and confused SKIN: Moist and warm with no rashes appreciated.  Psych: Not anxious, depressed LN: No inguinal LN enlargement    Antibiotics   Anti-infectives (From admission, onward)   Start     Dose/Rate Route Frequency Ordered Stop   05/15/19 1800  Ampicillin-Sulbactam (UNASYN) 3 g in sodium chloride 0.9 % 100 mL IVPB     3 g 200 mL/hr over 30 Minutes Intravenous Every 12 hours 05/15/19 1343     05/15/19 1200  vancomycin (VANCOCIN) IVPB 1000 mg/200 mL premix  Status:  Discontinued  1,000 mg 200 mL/hr over 60 Minutes Intravenous Every M-W-F (Hemodialysis) 05/12/19 1420 05/12/19 1917   05/14/19 2330  metroNIDAZOLE (FLAGYL) IVPB 500 mg  Status:  Discontinued     500 mg 100 mL/hr over 60 Minutes Intravenous Every 8 hours 05/14/19 2318 05/15/19 1342   05/12/19 1800  ceFEPIme (MAXIPIME) 1 g in sodium chloride 0.9 % 100 mL IVPB  Status:  Discontinued     1 g 200 mL/hr over 30 Minutes Intravenous Every 24 hours 05/12/19 1420 05/15/19 1342   05/12/19 1600  vancomycin (VANCOCIN) IVPB 1000 mg/200 mL premix     1,000 mg 200 mL/hr over 60 Minutes Intravenous  Once 05/12/19 1420 05/12/19 1656   05/11/19 1530  cefTRIAXone (ROCEPHIN) 2 g in sodium chloride 0.9 % 100 mL IVPB  Status:  Discontinued     2 g 200 mL/hr over 30 Minutes Intravenous Every 24 hours 05/11/19 1520 05/12/19 0759   05/11/19 1530  azithromycin (ZITHROMAX) 500 mg in sodium chloride 0.9 % 250 mL IVPB  Status:  Discontinued     500 mg 250 mL/hr over 60 Minutes Intravenous Every 24 hours 05/11/19 1520 05/12/19 0759   05/11/19 1300  vancomycin (VANCOCIN) 500 mg in sodium chloride 0.9 % 100 mL IVPB     500 mg 100 mL/hr over 60 Minutes Intravenous  Once 05/11/19 1116 05/11/19 1539   05/11/19 1100  vancomycin (VANCOCIN) IVPB 1000 mg/200 mL premix     1,000 mg 200 mL/hr over 60 Minutes Intravenous  Once 05/11/19 1054 05/11/19 1534   05/11/19 1100  ceFEPIme (MAXIPIME) 2 g in sodium chloride 0.9 % 100 mL IVPB     2 g 200 mL/hr  over 30 Minutes Intravenous  Once 05/11/19 1054 05/11/19 1219      Medications   Scheduled Meds: . chlorhexidine  15 mL Mouth/Throat Q4H  . cloNIDine  0.2 mg Transdermal Weekly  . feeding supplement (NEPRO CARB STEADY)  237 mL Oral BID BM  . heparin  5,000 Units Subcutaneous Q8H  . influenza vaccine adjuvanted  0.5 mL Intramuscular Tomorrow-1000  . lidocaine (PF)  5 mL Intradermal Once  . multivitamin  1 tablet Oral QHS  . mupirocin ointment  1 application Nasal BID  . pantoprazole (PROTONIX) IV  40 mg Intravenous Q12H  . sodium chloride flush  3 mL Intravenous Q12H  . vitamin C  500 mg Oral BID   Continuous Infusions: . sodium chloride Stopped (05/15/19 2045)  . ampicillin-sulbactam (UNASYN) IV 3 g (05/19/19 0543)   PRN Meds:.sodium chloride, acetaminophen **OR** acetaminophen, albuterol, iohexol   Data Review:   Micro Results Recent Results (from the past 240 hour(s))  Blood Culture (routine x 2)     Status: Abnormal   Collection Time: 05/11/19 11:21 AM   Specimen: BLOOD  Result Value Ref Range Status   Specimen Description BLOOD RIGHT ANTECUBITAL  Final   Special Requests   Final    BOTTLES DRAWN AEROBIC AND ANAEROBIC Blood Culture adequate volume   Culture  Setup Time   Final    GRAM NEGATIVE RODS ANAEROBIC BOTTLE ONLY CRITICAL RESULT CALLED TO, READ BACK BY AND VERIFIED WITH: JASON ROBBINS AT 1644 05/12/2019 KMP    Culture BACTEROIDES FRAGILIS BETA LACTAMASE POSITIVE  (A)  Final   Report Status 05/14/2019 FINAL  Final  Urine culture     Status: Abnormal   Collection Time: 05/11/19 11:21 AM   Specimen: Urine, Random  Result Value Ref Range Status   Specimen Description   Final  URINE, RANDOM Performed at [facility], [address] [place], [place] [address]    Special Requests   Final    NONE Performed at [facility], [address] [place], [place] [address]    Culture >=100,000 COLONIES/mL ESCHERICHIA COLI (A)  Final   Report  Status 05/13/2019 FINAL  Final   Organism ID, Bacteria ESCHERICHIA COLI (A)  Final      Susceptibility   Escherichia coli - MIC*    AMPICILLIN <=2 SENSITIVE Sensitive     CEFAZOLIN <=4 SENSITIVE Sensitive     CEFTRIAXONE <=1 SENSITIVE Sensitive     CIPROFLOXACIN <=0.25 SENSITIVE Sensitive     GENTAMICIN <=1 SENSITIVE Sensitive     IMIPENEM <=0.25 SENSITIVE Sensitive     NITROFURANTOIN <=16 SENSITIVE Sensitive     TRIMETH/SULFA <=20 SENSITIVE Sensitive     AMPICILLIN/SULBACTAM <=2 SENSITIVE Sensitive     PIP/TAZO <=4 SENSITIVE Sensitive     Extended ESBL NEGATIVE Sensitive     * >=100,000 COLONIES/mL ESCHERICHIA COLI  Blood Culture ID Panel (Reflexed)     Status: None   Collection Time: 05/11/19 11:21 AM  Result Value Ref Range Status   Enterococcus species NOT DETECTED NOT DETECTED Final   Listeria monocytogenes NOT DETECTED NOT DETECTED Final   Staphylococcus species NOT DETECTED NOT DETECTED Final   Staphylococcus aureus (BCID) NOT DETECTED NOT DETECTED Final   Streptococcus species NOT DETECTED NOT DETECTED Final   Streptococcus agalactiae NOT DETECTED NOT DETECTED Final   Streptococcus pneumoniae NOT DETECTED NOT DETECTED Final   Streptococcus pyogenes NOT DETECTED NOT DETECTED Final   Acinetobacter baumannii NOT DETECTED NOT DETECTED Final   Enterobacteriaceae species NOT DETECTED NOT DETECTED Final   Enterobacter cloacae complex NOT DETECTED NOT DETECTED Final   Escherichia coli NOT DETECTED NOT DETECTED Final   Klebsiella oxytoca NOT DETECTED NOT DETECTED Final   Klebsiella pneumoniae NOT DETECTED NOT DETECTED Final   Proteus species NOT DETECTED NOT DETECTED Final   Serratia marcescens NOT DETECTED NOT DETECTED Final   Haemophilus influenzae NOT DETECTED NOT DETECTED Final   Neisseria meningitidis NOT DETECTED NOT DETECTED Final   Pseudomonas aeruginosa NOT DETECTED NOT DETECTED Final   Candida albicans NOT DETECTED NOT DETECTED Final   Candida glabrata NOT DETECTED NOT  DETECTED Final   Candida krusei NOT DETECTED NOT DETECTED Final   Candida parapsilosis NOT DETECTED NOT DETECTED Final   Candida tropicalis NOT DETECTED NOT DETECTED Final    Comment: Performed at [facility], [address] [place], [place] [address]  SARS Coronavirus 2 by RT PCR (hospital order, performed in [place] hospital lab) Nasopharyngeal Nasopharyngeal Swab     Status: None   Collection Time: 05/11/19 12:01 PM   Specimen: Nasopharyngeal Swab  Result Value Ref Range Status   SARS Coronavirus 2 NEGATIVE NEGATIVE Final    Comment: (NOTE) If result is NEGATIVE SARS-CoV-2 target nucleic acids are NOT DETECTED. The SARS-CoV-2 RNA is generally detectable in upper and lower  respiratory specimens during the acute phase of infection. The lowest  concentration of SARS-CoV-2 viral copies this assay can detect is 250  copies / mL. A negative result does not preclude SARS-CoV-2 infection  and should not be used as the sole basis for treatment or other  patient management decisions.  A negative result may occur with  improper specimen collection / handling, submission of specimen other  than nasopharyngeal swab, presence of viral mutation(s) within the  areas targeted by this assay, and inadequate  number of viral copies  (<250 copies / mL). A negative result must be combined with clinical  observations, patient history, and epidemiological information. If result is POSITIVE SARS-CoV-2 target nucleic acids are DETECTED. The SARS-CoV-2 RNA is generally detectable in upper and lower  respiratory specimens dur ing the acute phase of infection.  Positive  results are indicative of active infection with SARS-CoV-2.  Clinical  correlation with patient history and other diagnostic information is  necessary to determine patient infection status.  Positive results do  not rule out bacterial infection or co-infection with other viruses. If result is PRESUMPTIVE POSTIVE SARS-CoV-2  nucleic acids MAY BE PRESENT.   A presumptive positive result was obtained on the submitted specimen  and confirmed on repeat testing.  While 2019 novel coronavirus  (SARS-CoV-2) nucleic acids may be present in the submitted sample  additional confirmatory testing may be necessary for epidemiological  and / or clinical management purposes  to differentiate between  SARS-CoV-2 and other Sarbecovirus currently known to infect humans.  If clinically indicated additional testing with an alternate test  methodology [phone]) is advised. The SARS-CoV-2 RNA is generally  detectable in upper and lower respiratory sp ecimens during the acute  phase of infection. The expected result is Negative. Fact Sheet for Patients:  StrictlyIdeas.no Fact Sheet for Healthcare Providers: BankingDealers.co.za This test is not yet approved or cleared by the [place] FDA and has been authorized for detection and/or diagnosis of SARS-CoV-2 by FDA under an Emergency Use Authorization (EUA).  This EUA will remain in effect (meaning this test can be used) for the duration of the COVID-19 declaration under Section 564(b)(1) of the Act, 21 U.S.C. section 360bbb-3(b)(1), unless the authorization is terminated or revoked sooner. Performed at [facility], [address] [place], [place] [address]   Blood Culture (routine x 2)     Status: None   Collection Time: 05/11/19  9:59 PM   Specimen: BLOOD  Result Value Ref Range Status   Specimen Description BLOOD BLOOD RIGHT HAND  Final   Special Requests   Final    BOTTLES DRAWN AEROBIC AND ANAEROBIC Blood Culture adequate volume   Culture   Final    NO GROWTH 5 DAYS Performed at [facility], [address] [place], [place] [address]    Report Status 05/16/2019 FINAL  Final  MRSA PCR Screening     Status: Abnormal   Collection Time: 05/12/19  7:01 PM   Specimen: Nasal Mucosa; Nasopharyngeal   Result Value Ref Range Status   MRSA by PCR POSITIVE (A) NEGATIVE Final    Comment:        The GeneXpert MRSA Assay (FDA approved for NASAL specimens only), is one component of a comprehensive MRSA colonization surveillance program. It is not intended to diagnose MRSA infection nor to guide or monitor treatment for MRSA infections. CRITICAL RESULT CALLED TO, READ BACK BY AND VERIFIED WITH: Jacqulynn Cadet AT 2103 05/12/2019 KMP Performed at [facility], [address] [place], [place] [address]     Radiology Reports Ct Abdomen Pelvis Wo Contrast  Result Date: 05/11/2019 CLINICAL DATA:  Abdominal pain, fever.  History of bladder cancer EXAM: CT ABDOMEN AND PELVIS WITHOUT CONTRAST TECHNIQUE: Multidetector CT imaging of the abdomen and pelvis was performed following the standard protocol without IV contrast. COMPARISON:  01/19/2018 FINDINGS: Lower chest: Small bilateral pleural effusions. Cardiomegaly. Bibasilar airspace opacities likely atelectasis. Hepatobiliary: No focal hepatic abnormality. Gallbladder unremarkable. Pancreas: No focal abnormality or ductal dilatation.  Spleen: No focal abnormality.  Normal size. Adrenals/Urinary Tract: Severe bilateral hydroureteronephrosis, unchanged. Bladder wall is markedly thickened and stable since prior study. No visible adrenal lesion. Stomach/Bowel: Stomach, large and small bowel grossly unremarkable. Vascular/Lymphatic: Heavily calcified aorta and iliac vessels. Right common iliac lymph node again noted with a short axis diameter of 1.5 cm, stable. Right inguinal lymph node has a short axis diameter of 7 mm, stable. Reproductive: Prior hysterectomy.  No adnexal masses. Other: No free fluid or free air. Musculoskeletal: No acute bony abnormality. IMPRESSION: Continued marked bladder wall thickening and severe bilateral hydronephrosis. Findings similar to prior study. Heavily calcified aorta and iliac vessels. New small bilateral pleural  effusions. Bibasilar opacities, likely atelectasis. Stable mildly enlarged and borderline sized right common iliac and right inguinal lymph nodes. Electronically Signed   By: Rolm Baptise M.D.   On: 05/11/2019 13:51   Ct Head Wo Contrast  Result Date: 05/12/2019 CLINICAL DATA:  Altered mental status. EXAM: CT HEAD WITHOUT CONTRAST TECHNIQUE: Contiguous axial images were obtained from the base of the skull through the vertex without intravenous contrast. COMPARISON:  Head CT 05/11/2017 FINDINGS: Brain: Stable age related cerebral atrophy, ventriculomegaly and periventricular white matter disease. No extra-axial fluid collections are identified. No CT findings for acute hemispheric infarction or intracranial hemorrhage. No mass lesions. The brainstem and cerebellum are normal. Vascular: Stable vascular calcifications. No aneurysm or hyperdense vessels. Skull: No skull fracture or bone lesions. Sinuses/Orbits: The paranasal sinuses and mastoid air cells are clear. The globes are intact. Other: No scalp lesions or hematoma. IMPRESSION: 1. Stable age related cerebral atrophy, ventriculomegaly and periventricular white matter disease. 2. No acute intracranial findings or mass lesions. Electronically Signed   By: Marijo Sanes M.D.   On: 05/12/2019 08:51   Mr Brain Wo Contrast  Result Date: 05/14/2019 CLINICAL DATA:  Altered mental status (AMS), unclear cause. EXAM: MRI HEAD WITHOUT CONTRAST TECHNIQUE: Multiplanar, multiecho pulse sequences of the brain and surrounding structures were obtained without intravenous contrast. COMPARISON:  Head CT examinations 05/12/2019 and earlier FINDINGS: Brain: Multiple sequences are significantly motion degraded, limiting evaluation. A fast brain protocol was utilized. There is no convincing evidence of acute infarct on mildly motion degraded diffusion weighted imaging. No evidence of intracranial mass. No midline shift or extra-axial fluid collection. No chronic intracranial  blood products. 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 There is otherwise mild scattered and confluent T2/FLAIR hyperintensity within the cerebral white matter consistent with chronic small vessel ischemic disease. Small T2 hyperintense foci within the bilateral basal ganglia likely reflect a combination of prominent perivascular spaces and chronic lacunar infarcts. Mild generalized parenchymal atrophy. Vascular: Flow voids maintained within the proximal large arterial vessels. Skull and upper cervical spine: No focal marrow lesion identified on motion degraded imaging. Incompletely assessed cervical spondylosis. Sinuses/Orbits: Bilateral lens replacements. No significant paranasal sinus disease or mastoid effusion. IMPRESSION: 1. Motion degraded examination. 2. No evidence of acute intracranial abnormality. 3. Generalized parenchymal atrophy with chronic small vessel ischemic disease. 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 Electronically Signed   By: Kellie Simmering DO   On: 05/14/2019 17:23   Dg Chest Port 1 View  Result Date: 05/11/2019 CLINICAL DATA:  Sepsis EXAM: PORTABLE CHEST 1 VIEW COMPARISON:  05/10/2018 FINDINGS: Cardiac shadow is mildly prominent but accentuated by the portable technique. Further stenting in the left innominate  vein and left subclavian vein is noted. Right chest wall port is been removed in the interval. Diffuse vascular congestion is noted with mild interstitial edema. This may be related to a degree of volume overload related to the end-stage renal failure. No focal confluent infiltrate is seen. No sizable effusion is noted. No bony abnormality is noted. IMPRESSION: Vascular congestion likely related to the known renal failure and volume overload. No other focal abnormality is seen. Electronically Signed   By: Inez Catalina  M.D.   On: 05/11/2019 12:11     CBC Recent Labs  Lab 05/13/19 0650 05/17/19 0930  WBC 13.2* 9.1  HGB 10.9* 10.1*  HCT 35.3* 33.6*  PLT 180 282  MCV 98.9 100.6*  MCH 30.5 30.2  MCHC 30.9 30.1  RDW 13.4 13.9    Chemistries  Recent Labs  Lab 05/13/19 0650 05/15/19 2123 05/17/19 0930  NA 139 142 146*  K 3.8 4.4 4.7  CL 98 100 101  CO2 23 24 23   GLUCOSE 117* 99 101*  BUN 33* 58* 107*  CREATININE 7.39* 8.84* 12.97*  CALCIUM 8.9 9.3 9.1  MG  --  2.1  --    ------------------------------------------------------------------------------------------------------------------ estimated creatinine clearance is 3.5 mL/min (A) (by C-G formula based on SCr of 12.97 mg/dL (H)). ------------------------------------------------------------------------------------------------------------------ No results for input(s): HGBA1C in the last 72 hours. ------------------------------------------------------------------------------------------------------------------ No results for input(s): CHOL, HDL, LDLCALC, TRIG, CHOLHDL, LDLDIRECT in the last 72 hours. ------------------------------------------------------------------------------------------------------------------ No results for input(s): TSH, T4TOTAL, T3FREE, THYROIDAB in the last 72 hours.  Invalid input(s): FREET3 ------------------------------------------------------------------------------------------------------------------ No results for input(s): VITAMINB12, FOLATE, FERRITIN, TIBC, IRON, RETICCTPCT in the last 72 hours.  Coagulation profile No results for input(s): INR, PROTIME in the last 168 hours.  No results for input(s): DDIMER in the last 72 hours.  Cardiac Enzymes No results for input(s): CKMB, TROPONINI, MYOGLOBIN in the last 168 hours.  Invalid input(s): CK ------------------------------------------------------------------------------------------------------------------ Invalid input(s): POCBNP    Assessment &  Plan   IMPRESSION AND PLAN: Patient 78 year old presenting with abdominal pain confusion and hypoxia  *Bacteroids fragilis bacteremia Likely GI source but patient has no acute findings on CT abd other than thickened urinary bladder with h/o bladder cancer and radiation No fistulas or abscesses On IV Unasyn. Consult ID. Appreciate help  *Community-acquired pneumonia.  Has been on IV antibiotics.  Improved  *E. coli UTI. On IV Unasyn  * Acute encephalopathy  No significant improvement Mri shows no acute abnormality  eeg pending  appreciate neurology and psychiatry input Slowly improving  *  End-stage renal disease nephrology has been consulted for hemodialysis continue PhosLo and Sensipar  *  Diabetes type 2 continue sliding-scale insulin  *  Sleep apnea CPAP at bedtime  *  GERD Protonix IV  *  hypertension hold bp meds on clondine tts     Code Status Orders  (From admission, onward)         Start     Ordered   05/11/19 2137  Full code  Continuous     05/11/19 2136        Code Status History    Date Active Date Inactive Code Status Order ID Comments User Context   07/27/2018 1819 07/30/2018 1640 Full Code [id number]  Sela Hua, MD Inpatient   05/10/2018 1831 05/13/2018 2249 Full Code [id number]  Hillary Bow, MD ED   01/24/2017 2348 01/29/2017 1614 Full Code [id number]  Harvie Bridge, DO Inpatient   11/19/2016 1528 11/23/2016 2156 Full Code [id number]  Loletha Grayer, MD  Inpatient   06/05/2016 0108 06/08/2016 1345 Full Code [id number]  Lance Coon, MD Inpatient   05/12/2016 1432 05/15/2016 1746 Full Code [id number]  Lytle Butte, MD Inpatient   05/04/2016 1455 05/09/2016 1828 Full Code [id number]  Epifanio Lesches, MD ED   04/17/2016 1336 04/20/2016 2041 Full Code [id number]  Demetrios Loll, MD Inpatient   03/04/2016 1358 03/10/2016 1440 Full Code [id number]  Hillary Bow, MD ED   01/21/2016 2115 01/27/2016 1801 Full Code [id number]  Edwin Dada, MD  Inpatient   01/20/2016 1841 01/21/2016 2115 Full Code [id number]  Theodoro Grist, MD Inpatient   12/18/2015 0214 12/23/2015 1651 Full Code [id number]  Saundra Shelling, MD [facility]     Consults  Neurology, Palliative care, Nephrology  DVT Prophylaxis  Heparin  Lab Results  Component Value Date   PLT 282 05/17/2019   Time Spent in minutes  44mn  SLeia AlfSudini M.D on 05/19/2019 at 4:21 PM  Between 7am to 6pm - Pager - [phone]  After 6pm go to [email or web address] - [email or web address]   Office  [phone]

## 2019-05-19 NOTE — Progress Notes (Signed)
PT Cancellation Note  Patient Details Name: Barbara [name] MRN: [id number] DOB: 31-Dec-1940   Cancelled Treatment:    Reason Eval/Treat Not Completed: Patient at procedure or test/unavailable Pt out of room for dialysis, will try back later as time allows and pt is appropriate.  Kreg Shropshire, DPT 05/19/2019, 12:07 PM

## 2019-05-19 NOTE — Progress Notes (Signed)
Speech Language Pathology Treatment: Dysphagia  Patient Details Name: Barbara [name] MRN: [id number] DOB: Feb 10, 1941 Today's Date: 05/19/2019 Time: 7416-3845 SLP Time Calculation (min) (ACUTE ONLY): 30 min  Assessment / Plan / Recommendation Clinical Impression  Pt seen today for ongoing toleration of diet and monitoring of safety w/ oral intake in light of her Cognitive status decline currently. This morning, she appeared less engaging w/ others in the room; Moderate-Severe confusion and agitated easily. She required Mod-Max verbal/tactile cues for follow through w/ tasks -- pt is not eager to participate in tasks.  During this breakfast meal, pt consumed sips of Juice (she accepted this only) w/ adequate pharyngeal phase swallowingof the thin liquids via straw w/ No overt s/s of aspiration noted; no coughing or decline in vocal quality or respiratory status during/post trials occurred. During the oral phase, she mashed around the min amount of puree trials presented but often talked over the bolus material in her mouth and wanted to spit it out -- she declined any further foods. This seemed to be directly impacted by her declined Cognitive decline and agitation, overall decreased awareness for the task of oral intake. W/ trials yesterday, she mashed and chewed on the increased textured foods w/ the few Dentition she had. (Pt's partial, Denture plate is at home per Husband). No oral phase deficits were noted w/ the trials of thin liquids.  Pt appears to present w/ continued Cognitive decline which can increase risk for aspiration, choking. Recommenda more Pureed diet w/ thin liquids via cup/straw w/ Supervision; aspiration precautions; Pills in Puree for safer swallowing; support at all meals - tray setup, positioning upright.This would be an advantageous diet consistency for pt w/ such Cognitive decline and also missing Dentition.RecommendST f/uat next week for ongoing assessment of status and to  see if diet consistency can be upgraded to least restrictive consistency, safely. At discharge (post acute illness), pt would benefit from formal Cognitive-linguistic assessment d/tpt'sdecline current Cognitive decline and such change in Neuro status per Husband's report -- f/u w/ Neurology for formal Cognitive assessment/dx would be recommended.NSG updated.     HPI HPI: Pt is a 78 y.o. female with a known history of multiple medical issues including bladder Ca, end-stage renal disease, HD, chronic kidney disease stage IV, COPD, coronary artery disease, diabetes type 2, GERD, celiac dis., sleep apnea, on O2 at home who was seen in the emergency room with complaint of abdominal pain and shortness of breath.  She was previously fairly independent w/ ADLs at home going to HD txs weekly.  During this admission, pt has exhibited increased confusion, nonverbal, and outbursts per staff reports. She has been NPO.  Noted CXR: vascular congestion, no other acute issuse; MRI: Generalized parenchymal atrophy with chronic small vessel ishemic disease.       SLP Plan  Continue with current plan of care       Recommendations  Diet recommendations: Dysphagia 1 (puree);Thin liquid Liquids provided via: Cup;Straw Medication Administration: Crushed with puree(for safer swallowing) Supervision: Staff to assist with self feeding;Full supervision/cueing for compensatory strategies Compensations: Minimize environmental distractions;Slow rate;Small sips/bites;Lingual sweep for clearance of pocketing;Multiple dry swallows after each bite/sip;Follow solids with liquid Postural Changes and/or Swallow Maneuvers: Seated upright 90 degrees;Upright 30-60 min after meal                General recommendations: (Dietician f/u) Oral Care Recommendations: Oral care BID;Oral care before and after PO;Staff/trained caregiver to provide oral care Follow up Recommendations: Skilled Nursing facility SLP Visit  Diagnosis:  Dysphagia, oropharyngeal phase (R13.12)(Cognitive decilne) Plan: Continue with current plan of care       Angels, Eldon, CCC-SLP Watson,Katherine 05/19/2019, 11:30 AM

## 2019-05-19 NOTE — Care Management Important Message (Signed)
Important Message  Patient Details  Name: Barbara [name] MRN: [id number] Date of Birth: 11/20/1940   Medicare Important Message Given:  Yes     Juliann Pulse A Frankie Zito 05/19/2019, 12:02 PM

## 2019-05-19 NOTE — TOC Progression Note (Signed)
Transition of Care [facility]) - Progression Note    Patient Details  Name: Barbara [name] MRN: [id number] Date of Birth: [birth year]  [facility] CM/SW Contact  [name], RN Phone Number: 05/19/2019, 4:26 PM  Clinical Narrative:    PT eval pending. Palliative recommending outpatient palliative to follow   Expected Discharge Plan: [place]    Expected Discharge Plan and Services Expected Discharge Plan: [place]       Living arrangements for the past 2 months: Single Family Home                                       Social Determinants of Health (SDOH) Interventions    Readmission Risk Interventions No flowsheet data found.

## 2019-05-19 NOTE — Progress Notes (Signed)
Late entry  Date of Admission:  05/11/2019     Husband at bedside who says she is not back to baseline- still confused. According to him she was driving to grocery store a week before admission.    Subjective: Says she is okay No c/o pain  Medications:  . chlorhexidine  15 mL Mouth/Throat Q4H  . cloNIDine  0.2 mg Transdermal Weekly  . feeding supplement (NEPRO CARB STEADY)  237 mL Oral BID BM  . heparin  5,000 Units Subcutaneous Q8H  . influenza vaccine adjuvanted  0.5 mL Intramuscular Tomorrow-1000  . lidocaine (PF)  5 mL Intradermal Once  . multivitamin  1 tablet Oral QHS  . mupirocin ointment  1 application Nasal BID  . pantoprazole (PROTONIX) IV  40 mg Intravenous Q12H  . sodium chloride flush  3 mL Intravenous Q12H  . vitamin C  500 mg Oral BID    Objective: Vital signs in last 24 hours: Temp:  [97.4 F (36.3 C)-98.3 F (36.8 C)] 97.4 F (36.3 C) (10/30 1355) Pulse Rate:  [50-100] 80 (10/30 1434) Resp:  [14-27] 23 (10/30 1434) BP: (83-141)/(41-71) 109/55 (10/30 1430) SpO2:  [92 %-100 %] 99 % (10/30 1434) Lab Results Recent Labs    05/17/19 0930  WBC 9.1  HGB 10.1*  HCT 33.6*  NA 146*  K 4.7  CL 101  CO2 23  BUN 107*  CREATININE 12.97*   Liver Panel Recent Labs    05/17/19 0930  ALBUMIN 3.1*   Sedimentation Rate No results for input(s): ESRSEDRATE in the last 72 hours. C-Reactive Protein No results for input(s): CRP in the last 72 hours.  Microbiology:10/22- Bacteroides bacteremia    Assessment/Plan: Bacteroides bacteremia- this is a GI organism-unclear  Where the breach was in the GI tract.  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 Could she have a colovesical fistula?  Bacteroides is an anerobic organism and urine culture was not processed for anerobes She has e.coli in the urine  Continue IV unasyn-  Until 05/27/19  Invasive bladder cancer s/p TURBT, palliative chemotherapy and radiation- has radiation  cystitis  Encephalopathy  ESRD on dialysis  Discussed the management with her husband and Dr.Sudinin on 05/19/19

## 2019-05-19 NOTE — Progress Notes (Signed)
This note also relates to the following rows which could not be included: Pulse Rate - Cannot attach notes to unvalidated device data Resp - Cannot attach notes to unvalidated device data BP - Cannot attach notes to unvalidated device data  Hd started

## 2019-05-19 NOTE — Evaluation (Signed)
Physical Therapy Evaluation Patient Details Name: Barbara [name] MRN: [id number] DOB: [birth year] Today's Date: 05/19/2019   History of Present Illness   78 y.o. female with a known history of end-stage renal disease, chronic kidney disease stage IV, COPD, coronary artery disease, diabetes type 2, GERD with history of sleep apnea who was seen in the emergency room with complaint of abdominal pain and shortness of breath.  Pt with significant change in baseline mental status, that has fluctuated thus far this admission (11/22), imaging has revealed no acute abnormality.  Clinical Impression  Pt struggled to participate with PT this afternoon.  She apparently was communicating some yesterday and appeared to have improving mental status, however today she could not even say her name and for all intents and purposes she could only say "no" and "I can't" when asked to do various movements, mobility, etc.  Husband reports that she was independent with SPC last week before she came in, apparently taking the bus to dialysis 3x/week w/o issue.  She was extremely limited functionally and mentally during PT exam and though she did show some effort with mobility was calling out in pain with all attempts at limb strength/movment testing, showed poor sitting balance at EOB and was completely unable to lift hips off bed even minimally despite raised bed and max assist from PT.  Pt will need STR once ready for d/c.     Follow Up Recommendations SNF    Equipment Recommendations  (TBD at next venue of care)    Recommendations for Other Services       Precautions / Restrictions Precautions Precautions: Fall Restrictions Weight Bearing Restrictions: No      Mobility  Bed Mobility Overal bed mobility: Needs Assistance Bed Mobility: Supine to Sit;Sit to Supine     Supine to sit: Mod assist;Max assist Sit to supine: Max assist   General bed mobility comments: Pt showed some effort with getting to sitting  but needed a lot of assist, total assist to get back to bed.   Transfers Overall transfer level: Needs assistance Equipment used: Rolling walker (2 wheeled) Transfers: Sit to/from Stand Sit to Stand: Total assist         General transfer comment: 2 attempts at standing that were completely unsuccessful.  Pt appeared to be willing to at least try standing but when tried she could not initate getting to standing at all  Ambulation/Gait             General Gait Details: unable to attain standing, no gait  Stairs            Wheelchair Mobility    Modified Rankin (Stroke Patients Only)       Balance Overall balance assessment: Needs assistance Sitting-balance support: Bilateral upper extremity supported Sitting balance-Leahy Scale: Poor Sitting balance - Comments: Pt initially leaning backward and needing constant assist.  Cuing and assist to reposition feet and hips and though she was leaning slightly to the R she was able to maintain sitting with only CGA for some of the time but generally needed at least min assist     Standing balance-Leahy Scale: Zero Standing balance comment: unable to attain standing                             Pertinent Vitals/Pain Pain Assessment: Faces Faces Pain Scale: Hurts even more Pain Location: Pt calling out in pain with post attempts at [facility], R worse than  L (U&LEs)    Home Living Family/patient expects to be discharged to:: Unsure Living Arrangements: Spouse/significant other                    Prior Function Level of Independence: Independent with assistive device(s)         Comments: Pt unable to answer questions, husband reports that last week she was independent with SPC walking to the bus to go to dialysis, able to manage ADLs/home w/o assist, etc     Hand Dominance   Dominant Hand: Right    Extremity/Trunk Assessment   Upper Extremity Assessment Upper Extremity Assessment: Difficult to  assess due to impaired cognition;Generalized weakness(v. limited testing d/t pain/cognition, resistant to R PROM)    Lower Extremity Assessment Lower Extremity Assessment: Difficult to assess due to impaired cognition;Generalized weakness(v. hesitant to participate d/t pain and confusion)       Communication   Communication: Expressive difficulties;Receptive difficulties(Pt with very little cogent articulation)  Cognition Arousal/Alertness: Lethargic Behavior During Therapy: Anxious Overall Cognitive Status: Impaired/Different from baseline                                 General Comments: Pt unable to state her name despite cuing from PT and husband, answers no questions, only verbalizes "I can't" and "no" when cued for mobility/standing      General Comments      Exercises     Assessment/Plan    PT Assessment Patient needs continued PT services  PT Problem List Decreased strength;Decreased range of motion;Decreased activity tolerance;Decreased balance;Decreased mobility;Decreased coordination;Decreased cognition;Decreased knowledge of use of DME;Decreased safety awareness;Decreased knowledge of precautions;Pain       PT Treatment Interventions DME instruction;Gait training;Stair training;Functional mobility training;Therapeutic activities;Therapeutic exercise;Balance training;Neuromuscular re-education;Cognitive remediation;Patient/family education    PT Goals (Current goals can be found in the Care Plan section)  Acute Rehab PT Goals Patient Stated Goal: unable to state goals, husband hopes she improves enough to go back home instead of rehab PT Goal Formulation: With patient Time For Goal Achievement: 06/02/19 Potential to Achieve Goals: Poor    Frequency Min 2X/week   Barriers to discharge        Co-evaluation               AM-PAC PT "6 Clicks" Mobility  Outcome Measure Help needed turning from your back to your side while in a flat bed  without using bedrails?: Total Help needed moving from lying on your back to sitting on the side of a flat bed without using bedrails?: Total Help needed moving to and from a bed to a chair (including a wheelchair)?: Total Help needed standing up from a chair using your arms (e.g., wheelchair or bedside chair)?: Total Help needed to walk in hospital room?: Total Help needed climbing 3-5 steps with a railing? : Total 6 Click Score: 6    End of Session Equipment Utilized During Treatment: Gait belt Activity Tolerance: Patient limited by pain;Patient limited by fatigue Patient left: with bed alarm set;with call bell/phone within reach Nurse Communication: Mobility status PT Visit Diagnosis: Muscle weakness (generalized) (M62.81);Difficulty in walking, not elsewhere classified (R26.2);Pain;Other abnormalities of gait and mobility (R26.89) Pain - Right/Left: Right Pain - part of body: Arm    Time: 1639-1701 PT Time Calculation (min) (ACUTE ONLY): 22 min   Charges:   PT Evaluation $PT Eval Moderate Complexity: 1 Mod  Kreg Shropshire, DPT 05/19/2019, 5:29 PM

## 2019-05-19 NOTE — Progress Notes (Signed)
Subjective: Patient continues to improve.    Objective: Current vital signs: BP 116/71 (BP Location: Right Arm)   Pulse (!) 50   Temp 98.3 F (36.8 C) (Oral)   Resp 20   Ht 5' 8"  (1.727 m)   Wt 74.3 kg   SpO2 100%   BMI 24.91 kg/m  Vital signs in last 24 hours: Temp:  [98.1 F (36.7 C)-98.6 F (37 C)] 98.3 F (36.8 C) (10/30 0521) Pulse Rate:  [50-88] 50 (10/30 0521) Resp:  [18-20] 20 (10/30 0521) BP: (113-144)/(51-71) 116/71 (10/30 0521) SpO2:  [92 %-100 %] 100 % (10/30 0521)  Intake/Output from previous day: 10/29 0701 - 10/30 0700 In: 206 [I.V.:6; IV Piggyback:200] Out: -  Intake/Output this shift: No intake/output data recorded. Nutritional status:  Diet Order            DIET DYS 3 Room service appropriate? Yes; Fluid consistency: Thin  Diet effective now              Neurologic Exam: Mental Status: Alert.  More conversant.  Does not know where she is.  Follows commands  Cranial Nerves: II: Blinks to bilateral confrontation, pupils equal, round, reactive to light and accommodation III,IV, VI: ptosis not present, extra-ocular motions intact bilaterally with right exotropia V,VII: face symmetric, facial light touch sensation normal bilaterally VIII: hearing normal bilaterally IX,X: gag reflex present XI: bilateral shoulder shrug XII: midline tongue extension Motor: Moves all extremities against gravity  Lab Results: Basic Metabolic Panel: Recent Labs  Lab 05/13/19 0650 05/15/19 2123 05/17/19 0930  NA 139 142 146*  K 3.8 4.4 4.7  CL 98 100 101  CO2 23 24 23   GLUCOSE 117* 99 101*  BUN 33* 58* 107*  CREATININE 7.39* 8.84* 12.97*  CALCIUM 8.9 9.3 9.1  MG  --  2.1  --   PHOS  --   --  7.6*    Liver Function Tests: Recent Labs  Lab 05/17/19 0930  ALBUMIN 3.1*   No results for input(s): LIPASE, AMYLASE in the last 168 hours. No results for input(s): AMMONIA in the last 168 hours.  CBC: Recent Labs  Lab 05/13/19 0650 05/17/19 0930  WBC  13.2* 9.1  HGB 10.9* 10.1*  HCT 35.3* 33.6*  MCV 98.9 100.6*  PLT 180 282    Cardiac Enzymes: No results for input(s): CKTOTAL, CKMB, CKMBINDEX, TROPONINI in the last 168 hours.  Lipid Panel: No results for input(s): CHOL, TRIG, HDL, CHOLHDL, VLDL, LDLCALC in the last 168 hours.  CBG: Recent Labs  Lab 05/12/19 1954 05/15/19 2022  GLUCAP 115* 99    Microbiology: Results for orders placed or performed during the hospital encounter of 05/11/19  Blood Culture (routine x 2)     Status: Abnormal   Collection Time: 05/11/19 11:21 AM   Specimen: BLOOD  Result Value Ref Range Status   Specimen Description BLOOD RIGHT ANTECUBITAL  Final   Special Requests   Final    BOTTLES DRAWN AEROBIC AND ANAEROBIC Blood Culture adequate volume   Culture  Setup Time   Final    GRAM NEGATIVE RODS ANAEROBIC BOTTLE ONLY CRITICAL RESULT CALLED TO, READ BACK BY AND VERIFIED WITH: JASON ROBBINS AT 4098 05/12/2019 [facility]    Culture BACTEROIDES FRAGILIS BETA LACTAMASE POSITIVE  (A)  Final   Report Status 05/14/2019 FINAL  Final  Urine culture     Status: Abnormal   Collection Time: 05/11/19 11:21 AM   Specimen: Urine, Random  Result Value Ref Range Status  Specimen Description   Final    URINE, RANDOM Performed at [facility], [address] [place], [place] [address]    Special Requests   Final    NONE Performed at [facility], [address] [place], [place] [address]    Culture >=100,000 COLONIES/mL ESCHERICHIA COLI (A)  Final   Report Status 05/13/2019 FINAL  Final   Organism ID, Bacteria ESCHERICHIA COLI (A)  Final      Susceptibility   Escherichia coli - MIC*    AMPICILLIN <=2 SENSITIVE Sensitive     CEFAZOLIN <=4 SENSITIVE Sensitive     CEFTRIAXONE <=1 SENSITIVE Sensitive     CIPROFLOXACIN <=0.25 SENSITIVE Sensitive     GENTAMICIN <=1 SENSITIVE Sensitive     IMIPENEM <=0.25 SENSITIVE Sensitive     NITROFURANTOIN <=16 SENSITIVE Sensitive     TRIMETH/SULFA  <=20 SENSITIVE Sensitive     AMPICILLIN/SULBACTAM <=2 SENSITIVE Sensitive     PIP/TAZO <=4 SENSITIVE Sensitive     Extended ESBL NEGATIVE Sensitive     * >=100,000 COLONIES/mL ESCHERICHIA COLI  Blood Culture ID Panel (Reflexed)     Status: None   Collection Time: 05/11/19 11:21 AM  Result Value Ref Range Status   Enterococcus species NOT DETECTED NOT DETECTED Final   Listeria monocytogenes NOT DETECTED NOT DETECTED Final   Staphylococcus species NOT DETECTED NOT DETECTED Final   Staphylococcus aureus (BCID) NOT DETECTED NOT DETECTED Final   Streptococcus species NOT DETECTED NOT DETECTED Final   Streptococcus agalactiae NOT DETECTED NOT DETECTED Final   Streptococcus pneumoniae NOT DETECTED NOT DETECTED Final   Streptococcus pyogenes NOT DETECTED NOT DETECTED Final   Acinetobacter baumannii NOT DETECTED NOT DETECTED Final   Enterobacteriaceae species NOT DETECTED NOT DETECTED Final   Enterobacter cloacae complex NOT DETECTED NOT DETECTED Final   Escherichia coli NOT DETECTED NOT DETECTED Final   Klebsiella oxytoca NOT DETECTED NOT DETECTED Final   Klebsiella pneumoniae NOT DETECTED NOT DETECTED Final   Proteus species NOT DETECTED NOT DETECTED Final   Serratia marcescens NOT DETECTED NOT DETECTED Final   Haemophilus influenzae NOT DETECTED NOT DETECTED Final   Neisseria meningitidis NOT DETECTED NOT DETECTED Final   Pseudomonas aeruginosa NOT DETECTED NOT DETECTED Final   Candida albicans NOT DETECTED NOT DETECTED Final   Candida glabrata NOT DETECTED NOT DETECTED Final   Candida krusei NOT DETECTED NOT DETECTED Final   Candida parapsilosis NOT DETECTED NOT DETECTED Final   Candida tropicalis NOT DETECTED NOT DETECTED Final    Comment: Performed at [facility], [address] [place], [place] [address]  SARS Coronavirus 2 by RT PCR (hospital order, performed in [place] hospital lab) Nasopharyngeal Nasopharyngeal Swab     Status: None   Collection Time: 05/11/19 12:01  PM   Specimen: Nasopharyngeal Swab  Result Value Ref Range Status   SARS Coronavirus 2 NEGATIVE NEGATIVE Final    Comment: (NOTE) If result is NEGATIVE SARS-CoV-2 target nucleic acids are NOT DETECTED. The SARS-CoV-2 RNA is generally detectable in upper and lower  respiratory specimens during the acute phase of infection. The lowest  concentration of SARS-CoV-2 viral copies this assay can detect is 250  copies / mL. A negative result does not preclude SARS-CoV-2 infection  and should not be used as the sole basis for treatment or other  patient management decisions.  A negative result may occur with  improper specimen collection / handling, submission of specimen other  than nasopharyngeal swab, presence of viral mutation(s) within the  areas targeted by this assay, and inadequate number of viral copies  (<250 copies / mL). A negative result must be combined with clinical  observations, patient history, and epidemiological information. If result is POSITIVE SARS-CoV-2 target nucleic acids are DETECTED. The SARS-CoV-2 RNA is generally detectable in upper and lower  respiratory specimens dur ing the acute phase of infection.  Positive  results are indicative of active infection with SARS-CoV-2.  Clinical  correlation with patient history and other diagnostic information is  necessary to determine patient infection status.  Positive results do  not rule out bacterial infection or co-infection with other viruses. If result is PRESUMPTIVE POSTIVE SARS-CoV-2 nucleic acids MAY BE PRESENT.   A presumptive positive result was obtained on the submitted specimen  and confirmed on repeat testing.  While 2019 novel coronavirus  (SARS-CoV-2) nucleic acids may be present in the submitted sample  additional confirmatory testing may be necessary for epidemiological  and / or clinical management purposes  to differentiate between  SARS-CoV-2 and other Sarbecovirus currently known to infect humans.   If clinically indicated additional testing with an alternate test  methodology [phone]) is advised. The SARS-CoV-2 RNA is generally  detectable in upper and lower respiratory sp ecimens during the acute  phase of infection. The expected result is Negative. Fact Sheet for Patients:  StrictlyIdeas.no Fact Sheet for Healthcare Providers: BankingDealers.co.za This test is not yet approved or cleared by the [place] FDA and has been authorized for detection and/or diagnosis of SARS-CoV-2 by FDA under an Emergency Use Authorization (EUA).  This EUA will remain in effect (meaning this test can be used) for the duration of the COVID-19 declaration under Section 564(b)(1) of the Act, 21 U.S.C. section 360bbb-3(b)(1), unless the authorization is terminated or revoked sooner. Performed at [facility], [address] [place], [place] [address]   Blood Culture (routine x 2)     Status: None   Collection Time: 05/11/19  9:59 PM   Specimen: BLOOD  Result Value Ref Range Status   Specimen Description BLOOD BLOOD RIGHT HAND  Final   Special Requests   Final    BOTTLES DRAWN AEROBIC AND ANAEROBIC Blood Culture adequate volume   Culture   Final    NO GROWTH 5 DAYS Performed at [facility], [address] [place], [place] [address]    Report Status 05/16/2019 FINAL  Final  MRSA PCR Screening     Status: Abnormal   Collection Time: 05/12/19  7:01 PM   Specimen: Nasal Mucosa; Nasopharyngeal  Result Value Ref Range Status   MRSA by PCR POSITIVE (A) NEGATIVE Final    Comment:        The GeneXpert MRSA Assay (FDA approved for NASAL specimens only), is one component of a comprehensive MRSA colonization surveillance program. It is not intended to diagnose MRSA infection nor to guide or monitor treatment for MRSA infections. CRITICAL RESULT CALLED TO, READ BACK BY AND VERIFIED WITH: Jacqulynn Cadet AT 2103 05/12/2019  KMP Performed at [facility], [address] [place], [place] [address]     Coagulation Studies: No results for input(s): LABPROT, INR in the last 72 hours.  Imaging: No results found.  Medications:  I have reviewed the patient's current medications. Scheduled: . chlorhexidine  15 mL Mouth/Throat Q4H  . cloNIDine  0.2 mg Transdermal Weekly  . feeding supplement (NEPRO CARB STEADY)  237 mL Oral BID BM  . heparin  5,000 Units Subcutaneous Q8H  . influenza vaccine adjuvanted  0.5 mL Intramuscular Tomorrow-1000  . lidocaine (PF)  5 mL Intradermal Once  . multivitamin  1 tablet Oral QHS  . mupirocin ointment  1 application Nasal BID  . pantoprazole (PROTONIX) IV  40 mg Intravenous Q12H  . sodium chloride flush  3 mL Intravenous Q12H  . vitamin C  500 mg Oral BID    Assessment/Plan: Patient continues to improve.  No further neurological intervention recommended at this time.     LOS: 8 days   Alexis Goodell, MD Neurology [phone] 05/19/2019  9:26 AM

## 2019-05-19 NOTE — Plan of Care (Addendum)
PMT note: Patient off floor at this time. Spoke with husband. He is happy with her improvement. Continuing current treatment.  Recommend palliative to follow outpatient.    No charge.

## 2019-05-19 NOTE — Progress Notes (Signed)
This note also relates to the following rows which could not be included: Pulse Rate - Cannot attach notes to unvalidated device data Resp - Cannot attach notes to unvalidated device data BP - Cannot attach notes to unvalidated device data  Hd completed

## 2019-05-20 MED ORDER — LOSARTAN POTASSIUM 50 MG PO TABS
100.0000 mg | ORAL_TABLET | Freq: Every day | ORAL | Status: DC
  Filled 2019-05-20 (×3): qty 2

## 2019-05-20 MED ORDER — CALCIUM ACETATE (PHOS BINDER) 667 MG PO CAPS
1334.0000 mg | ORAL_CAPSULE | Freq: Three times a day (TID) | ORAL | Status: DC
  Administered 2019-05-22 – 2019-05-26 (×4): 1334 mg via ORAL
  Filled 2019-05-20 (×8): qty 2

## 2019-05-20 NOTE — Progress Notes (Signed)
Patient fell out of bed this morning at 08:49. She was trying to get up and got tangled in her blankets. Her bed alarm went off and our staff went running to her. She was found by our staff on her floor wrapped up in her blankets. Patient states she is not in any pain. Assessment was within baseline for patient. No acute changes to her neuro exam and all vital signs were normal. Hillary Bow, MD was notified as well as Patient's husband was called. Low bed was ordered as well as floor mats and those will be implemented STAT.

## 2019-05-20 NOTE — Progress Notes (Signed)
[facility] [place], is a 78 y.o. female, DOB - Dec 07, 1940, YHC:[id number]  Admit date - 05/11/2019   Admitting Physician Dustin Flock, MD  Outpatient Primary MD for the patient is [name], MD   LOS - 9  Subjective:  More awake but confused. HD done yesterday  Review of Systems:   :unable to provide  Vitals:   Vitals:   05/19/19 1430 05/19/19 1434 05/19/19 2110 05/20/19 0446  BP: (!) 109/55  (!) 114/94 (!) 112/48  Pulse: 83 80 75 77  Resp: (!) 24 (!) 23 20 20   Temp:   98.4 F (36.9 C) 97.8 F (36.6 C)  TempSrc:   Oral Oral  SpO2: 100% 99% 100% 96%  Weight:      Height:        Wt Readings from Last 3 Encounters:  05/17/19 74.3 kg  02/28/19 81.6 kg  02/07/19 81.6 kg     Intake/Output Summary (Last 24 hours) at 05/20/2019 0734 Last data filed at 05/20/2019 0318 Gross per 24 hour  Intake 414 ml  Output -248 ml  Net 662 ml    Physical Exam:   GENERAL: obsese chronically ill appearing  HEAD, EYES, EARS, NOSE AND THROAT: Atraumatic, normocephalic. Extraocular muscles are intact. Pupils equal and reactive to light. Sclerae anicteric. No conjunctival injection. No oro-pharyngeal erythema.  NECK: Supple. There is no jugular venous distention. No bruits, no lymphadenopathy, no thyromegaly.  HEART: Regular rate and rhythm,. No murmurs, no rubs, no clicks.  LUNGS: Clear to auscultation bilaterally. No rales or rhonchi. No wheezes.  ABDOMEN: Soft, flat, nontender, nondistended. Has good bowel sounds. No hepatosplenomegaly appreciated.  EXTREMITIES: No evidence of any cyanosis, clubbing, or peripheral edema.  +2 pedal and radial pulses bilaterally.  NEUROLOGIC: Patient awake and confused SKIN: Moist and warm with no rashes  appreciated.  Psych: Not anxious, depressed LN: No inguinal LN enlargement    Antibiotics   Anti-infectives (From admission, onward)   Start     Dose/Rate Route Frequency Ordered Stop   05/15/19 1800  Ampicillin-Sulbactam (UNASYN) 3 g in sodium chloride 0.9 % 100 mL IVPB     3 g 200 mL/hr over 30 Minutes Intravenous Every 12 hours 05/15/19 1343     05/15/19 1200  vancomycin (VANCOCIN) IVPB 1000 mg/200 mL premix  Status:  Discontinued     1,000 mg 200 mL/hr over 60 Minutes Intravenous Every M-W-F (Hemodialysis) 05/12/19  1420 05/12/19 1917   05/14/19 2330  metroNIDAZOLE (FLAGYL) IVPB 500 mg  Status:  Discontinued     500 mg 100 mL/hr over 60 Minutes Intravenous Every 8 hours 05/14/19 2318 05/15/19 1342   05/12/19 1800  ceFEPIme (MAXIPIME) 1 g in sodium chloride 0.9 % 100 mL IVPB  Status:  Discontinued     1 g 200 mL/hr over 30 Minutes Intravenous Every 24 hours 05/12/19 1420 05/15/19 1342   05/12/19 1600  vancomycin (VANCOCIN) IVPB 1000 mg/200 mL premix     1,000 mg 200 mL/hr over 60 Minutes Intravenous  Once 05/12/19 1420 05/12/19 1656   05/11/19 1530  cefTRIAXone (ROCEPHIN) 2 g in sodium chloride 0.9 % 100 mL IVPB  Status:  Discontinued     2 g 200 mL/hr over 30 Minutes Intravenous Every 24 hours 05/11/19 1520 05/12/19 0759   05/11/19 1530  azithromycin (ZITHROMAX) 500 mg in sodium chloride 0.9 % 250 mL IVPB  Status:  Discontinued     500 mg 250 mL/hr over 60 Minutes Intravenous Every 24 hours 05/11/19 1520 05/12/19 0759   05/11/19 1300  vancomycin (VANCOCIN) 500 mg in sodium chloride 0.9 % 100 mL IVPB     500 mg 100 mL/hr over 60 Minutes Intravenous  Once 05/11/19 1116 05/11/19 1539   05/11/19 1100  vancomycin (VANCOCIN) IVPB 1000 mg/200 mL premix     1,000 mg 200 mL/hr over 60 Minutes Intravenous  Once 05/11/19 1054 05/11/19 1534   05/11/19 1100  ceFEPIme (MAXIPIME) 2 g in sodium chloride 0.9 % 100 mL IVPB     2 g 200 mL/hr over 30 Minutes Intravenous  Once 05/11/19 1054  05/11/19 1219      Medications   Scheduled Meds: . chlorhexidine  15 mL Mouth/Throat Q4H  . cloNIDine  0.2 mg Transdermal Weekly  . feeding supplement (NEPRO CARB STEADY)  237 mL Oral BID BM  . heparin  5,000 Units Subcutaneous Q8H  . influenza vaccine adjuvanted  0.5 mL Intramuscular Tomorrow-1000  . lidocaine (PF)  5 mL Intradermal Once  . multivitamin  1 tablet Oral QHS  . mupirocin ointment  1 application Nasal BID  . pantoprazole (PROTONIX) IV  40 mg Intravenous Q12H  . sodium chloride flush  3 mL Intravenous Q12H  . vitamin C  500 mg Oral BID   Continuous Infusions: . sodium chloride 500 mL (05/20/19 0456)  . ampicillin-sulbactam (UNASYN) IV 3 g (05/20/19 0459)   PRN Meds:.sodium chloride, acetaminophen **OR** acetaminophen, albuterol, iohexol   Data Review:   Micro Results Recent Results (from the past 240 hour(s))  Blood Culture (routine x 2)     Status: Abnormal   Collection Time: 05/11/19 11:21 AM   Specimen: BLOOD  Result Value Ref Range Status   Specimen Description BLOOD RIGHT ANTECUBITAL  Final   Special Requests   Final    BOTTLES DRAWN AEROBIC AND ANAEROBIC Blood Culture adequate volume   Culture  Setup Time   Final    GRAM NEGATIVE RODS ANAEROBIC BOTTLE ONLY CRITICAL RESULT CALLED TO, READ BACK BY AND VERIFIED WITH: JASON ROBBINS AT 1644 05/12/2019 KMP    Culture BACTEROIDES FRAGILIS BETA LACTAMASE POSITIVE  (A)  Final   Report Status 05/14/2019 FINAL  Final  Urine culture     Status: Abnormal   Collection Time: 05/11/19 11:21 AM   Specimen: Urine, Random  Result Value Ref Range Status   Specimen Description   Final    URINE, RANDOM Performed at [facility], [address] [place], [place] [address]    Special Requests   Final    NONE Performed at [facility], [address] [place], [place] [address]    Culture >=100,000 COLONIES/mL ESCHERICHIA COLI (A)  Final   Report Status 05/13/2019 FINAL  Final   Organism ID,  Bacteria ESCHERICHIA COLI (A)  Final      Susceptibility   Escherichia coli - MIC*    AMPICILLIN <=2 SENSITIVE Sensitive     CEFAZOLIN <=4 SENSITIVE Sensitive     CEFTRIAXONE <=1 SENSITIVE Sensitive     CIPROFLOXACIN <=0.25 SENSITIVE Sensitive     GENTAMICIN <=1 SENSITIVE Sensitive     IMIPENEM <=0.25 SENSITIVE Sensitive     NITROFURANTOIN <=16 SENSITIVE Sensitive     TRIMETH/SULFA <=20 SENSITIVE Sensitive     AMPICILLIN/SULBACTAM <=2 SENSITIVE Sensitive     PIP/TAZO <=4 SENSITIVE Sensitive     Extended ESBL NEGATIVE Sensitive     * >=100,000 COLONIES/mL ESCHERICHIA COLI  Blood Culture ID Panel (Reflexed)     Status: None   Collection Time: 05/11/19 11:21 AM  Result Value Ref Range Status   Enterococcus species NOT DETECTED NOT DETECTED Final   Listeria monocytogenes NOT DETECTED NOT DETECTED Final   Staphylococcus species NOT DETECTED NOT DETECTED Final   Staphylococcus aureus (BCID) NOT DETECTED NOT DETECTED Final   Streptococcus species NOT DETECTED NOT DETECTED Final   Streptococcus agalactiae NOT DETECTED NOT DETECTED Final   Streptococcus pneumoniae NOT DETECTED NOT DETECTED Final   Streptococcus pyogenes NOT DETECTED NOT DETECTED Final   Acinetobacter baumannii NOT DETECTED NOT DETECTED Final   Enterobacteriaceae species NOT DETECTED NOT DETECTED Final   Enterobacter cloacae complex NOT DETECTED NOT DETECTED Final   Escherichia coli NOT DETECTED NOT DETECTED Final   Klebsiella oxytoca NOT DETECTED NOT DETECTED Final   Klebsiella pneumoniae NOT DETECTED NOT DETECTED Final   Proteus species NOT DETECTED NOT DETECTED Final   Serratia marcescens NOT DETECTED NOT DETECTED Final   Haemophilus influenzae NOT DETECTED NOT DETECTED Final   Neisseria meningitidis NOT DETECTED NOT DETECTED Final   Pseudomonas aeruginosa NOT DETECTED NOT DETECTED Final   Candida albicans NOT DETECTED NOT DETECTED Final   Candida glabrata NOT DETECTED NOT DETECTED Final   Candida krusei NOT DETECTED  NOT DETECTED Final   Candida parapsilosis NOT DETECTED NOT DETECTED Final   Candida tropicalis NOT DETECTED NOT DETECTED Final    Comment: Performed at [facility], [address] [place], [place] [address]  SARS Coronavirus 2 by RT PCR (hospital order, performed in [place] hospital lab) Nasopharyngeal Nasopharyngeal Swab     Status: None   Collection Time: 05/11/19 12:01 PM   Specimen: Nasopharyngeal Swab  Result Value Ref Range Status   SARS Coronavirus 2 NEGATIVE NEGATIVE Final    Comment: (NOTE) If result is NEGATIVE SARS-CoV-2 target nucleic acids are NOT DETECTED. The SARS-CoV-2 RNA is generally detectable in upper and lower  respiratory specimens during the acute phase of infection. The lowest  concentration of SARS-CoV-2 viral copies this assay can detect is 250  copies / mL. A negative result does not preclude SARS-CoV-2 infection  and should not be used as the sole basis for treatment or other  patient management decisions.  A negative result may occur with  improper specimen collection / handling, submission of specimen other  than nasopharyngeal swab, presence of viral mutation(s) within the  areas targeted by this assay, and inadequate number of viral copies  (<250 copies /  mL). A negative result must be combined with clinical  observations, patient history, and epidemiological information. If result is POSITIVE SARS-CoV-2 target nucleic acids are DETECTED. The SARS-CoV-2 RNA is generally detectable in upper and lower  respiratory specimens dur ing the acute phase of infection.  Positive  results are indicative of active infection with SARS-CoV-2.  Clinical  correlation with patient history and other diagnostic information is  necessary to determine patient infection status.  Positive results do  not rule out bacterial infection or co-infection with other viruses. If result is PRESUMPTIVE POSTIVE SARS-CoV-2 nucleic acids MAY BE PRESENT.   A presumptive  positive result was obtained on the submitted specimen  and confirmed on repeat testing.  While 2019 novel coronavirus  (SARS-CoV-2) nucleic acids may be present in the submitted sample  additional confirmatory testing may be necessary for epidemiological  and / or clinical management purposes  to differentiate between  SARS-CoV-2 and other Sarbecovirus currently known to infect humans.  If clinically indicated additional testing with an alternate test  methodology [phone]) is advised. The SARS-CoV-2 RNA is generally  detectable in upper and lower respiratory sp ecimens during the acute  phase of infection. The expected result is Negative. Fact Sheet for Patients:  StrictlyIdeas.no Fact Sheet for Healthcare Providers: BankingDealers.co.za This test is not yet approved or cleared by the [place] FDA and has been authorized for detection and/or diagnosis of SARS-CoV-2 by FDA under an Emergency Use Authorization (EUA).  This EUA will remain in effect (meaning this test can be used) for the duration of the COVID-19 declaration under Section 564(b)(1) of the Act, 21 U.S.C. section 360bbb-3(b)(1), unless the authorization is terminated or revoked sooner. Performed at [facility], [address] [place], [place] [address]   Blood Culture (routine x 2)     Status: None   Collection Time: 05/11/19  9:59 PM   Specimen: BLOOD  Result Value Ref Range Status   Specimen Description BLOOD BLOOD RIGHT HAND  Final   Special Requests   Final    BOTTLES DRAWN AEROBIC AND ANAEROBIC Blood Culture adequate volume   Culture   Final    NO GROWTH 5 DAYS Performed at [facility], [address] [place], [place] [address]    Report Status 05/16/2019 FINAL  Final  MRSA PCR Screening     Status: Abnormal   Collection Time: 05/12/19  7:01 PM   Specimen: Nasal Mucosa; Nasopharyngeal  Result Value Ref Range Status   MRSA by PCR  POSITIVE (A) NEGATIVE Final    Comment:        The GeneXpert MRSA Assay (FDA approved for NASAL specimens only), is one component of a comprehensive MRSA colonization surveillance program. It is not intended to diagnose MRSA infection nor to guide or monitor treatment for MRSA infections. CRITICAL RESULT CALLED TO, READ BACK BY AND VERIFIED WITH: Jacqulynn Cadet AT 2103 05/12/2019 KMP Performed at [facility], [address], [place], [place] [address]     Radiology Reports Ct Abdomen Pelvis Wo Contrast  Result Date: 05/11/2019 CLINICAL DATA:  Abdominal pain, fever.  History of bladder cancer EXAM: CT ABDOMEN AND PELVIS WITHOUT CONTRAST TECHNIQUE: Multidetector CT imaging of the abdomen and pelvis was performed following the standard protocol without IV contrast. COMPARISON:  01/19/2018 FINDINGS: Lower chest: Small bilateral pleural effusions. Cardiomegaly. Bibasilar airspace opacities likely atelectasis. Hepatobiliary: No focal hepatic abnormality. Gallbladder unremarkable. Pancreas: No focal abnormality or ductal dilatation. Spleen: No focal abnormality.  Normal size. Adrenals/Urinary  Tract: Severe bilateral hydroureteronephrosis, unchanged. Bladder wall is markedly thickened and stable since prior study. No visible adrenal lesion. Stomach/Bowel: Stomach, large and small bowel grossly unremarkable. Vascular/Lymphatic: Heavily calcified aorta and iliac vessels. Right common iliac lymph node again noted with a short axis diameter of 1.5 cm, stable. Right inguinal lymph node has a short axis diameter of 7 mm, stable. Reproductive: Prior hysterectomy.  No adnexal masses. Other: No free fluid or free air. Musculoskeletal: No acute bony abnormality. IMPRESSION: Continued marked bladder wall thickening and severe bilateral hydronephrosis. Findings similar to prior study. Heavily calcified aorta and iliac vessels. New small bilateral pleural effusions. Bibasilar opacities, likely atelectasis.  Stable mildly enlarged and borderline sized right common iliac and right inguinal lymph nodes. Electronically Signed   By: Rolm Baptise M.D.   On: 05/11/2019 13:51   Ct Head Wo Contrast  Result Date: 05/12/2019 CLINICAL DATA:  Altered mental status. EXAM: CT HEAD WITHOUT CONTRAST TECHNIQUE: Contiguous axial images were obtained from the base of the skull through the vertex without intravenous contrast. COMPARISON:  Head CT 05/11/2017 FINDINGS: Brain: Stable age related cerebral atrophy, ventriculomegaly and periventricular white matter disease. No extra-axial fluid collections are identified. No CT findings for acute hemispheric infarction or intracranial hemorrhage. No mass lesions. The brainstem and cerebellum are normal. Vascular: Stable vascular calcifications. No aneurysm or hyperdense vessels. Skull: No skull fracture or bone lesions. Sinuses/Orbits: The paranasal sinuses and mastoid air cells are clear. The globes are intact. Other: No scalp lesions or hematoma. IMPRESSION: 1. Stable age related cerebral atrophy, ventriculomegaly and periventricular white matter disease. 2. No acute intracranial findings or mass lesions. Electronically Signed   By: Marijo Sanes M.D.   On: 05/12/2019 08:51   Mr Brain Wo Contrast  Result Date: 05/14/2019 CLINICAL DATA:  Altered mental status (AMS), unclear cause. EXAM: MRI HEAD WITHOUT CONTRAST TECHNIQUE: Multiplanar, multiecho pulse sequences of the brain and surrounding structures were obtained without intravenous contrast. COMPARISON:  Head CT examinations 05/12/2019 and earlier FINDINGS: Brain: Multiple sequences are significantly motion degraded, limiting evaluation. A fast brain protocol was utilized. There is no convincing evidence of acute infarct on mildly motion degraded diffusion weighted imaging. No evidence of intracranial mass. No midline shift or extra-axial fluid collection. No chronic intracranial blood products. There is a small chronic white matter  infarct within the anterior right frontal lobe which may subtly involve the overlying cortex. There is otherwise mild scattered and confluent T2/FLAIR hyperintensity within the cerebral white matter consistent with chronic small vessel ischemic disease. Small T2 hyperintense foci within the bilateral basal ganglia likely reflect a combination of prominent perivascular spaces and chronic lacunar infarcts. Mild generalized parenchymal atrophy. Vascular: Flow voids maintained within the proximal large arterial vessels. Skull and upper cervical spine: No focal marrow lesion identified on motion degraded imaging. Incompletely assessed cervical spondylosis. Sinuses/Orbits: Bilateral lens replacements. No significant paranasal sinus disease or mastoid effusion. IMPRESSION: 1. Motion degraded examination. 2. No evidence of acute intracranial abnormality. 3. Generalized parenchymal atrophy with chronic small vessel ischemic disease. 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 Electronically Signed   By: Kellie Simmering DO   On: 05/14/2019 17:23   Dg Chest Port 1 View  Result Date: 05/11/2019 CLINICAL DATA:  Sepsis EXAM: PORTABLE CHEST 1 VIEW COMPARISON:  05/10/2018 FINDINGS: Cardiac shadow is mildly prominent but accentuated by the portable technique. Further stenting in the left innominate vein and left subclavian vein is noted. Right  chest wall port is been removed in the interval. Diffuse vascular congestion is noted with mild interstitial edema. This may be related to a degree of volume overload related to the end-stage renal failure. No focal confluent infiltrate is seen. No sizable effusion is noted. No bony abnormality is noted. IMPRESSION: Vascular congestion likely related to the known renal failure and volume overload. No other focal abnormality is seen. Electronically Signed   By: Inez Catalina M.D.   On: 05/11/2019 12:11     CBC Recent Labs   Lab 05/17/19 0930  WBC 9.1  HGB 10.1*  HCT 33.6*  PLT 282  MCV 100.6*  MCH 30.2  MCHC 30.1  RDW 13.9    Chemistries  Recent Labs  Lab 05/15/19 2123 05/17/19 0930  NA 142 146*  K 4.4 4.7  CL 100 101  CO2 24 23  GLUCOSE 99 101*  BUN 58* 107*  CREATININE 8.84* 12.97*  CALCIUM 9.3 9.1  MG 2.1  --    ------------------------------------------------------------------------------------------------------------------ estimated creatinine clearance is 3.5 mL/min (A) (by C-G formula based on SCr of 12.97 mg/dL (H)). ------------------------------------------------------------------------------------------------------------------ No results for input(s): HGBA1C in the last 72 hours. ------------------------------------------------------------------------------------------------------------------ No results for input(s): CHOL, HDL, LDLCALC, TRIG, CHOLHDL, LDLDIRECT in the last 72 hours. ------------------------------------------------------------------------------------------------------------------ No results for input(s): TSH, T4TOTAL, T3FREE, THYROIDAB in the last 72 hours.  Invalid input(s): FREET3 ------------------------------------------------------------------------------------------------------------------ No results for input(s): VITAMINB12, FOLATE, FERRITIN, TIBC, IRON, RETICCTPCT in the last 72 hours.  Coagulation profile No results for input(s): INR, PROTIME in the last 168 hours.  No results for input(s): DDIMER in the last 72 hours.  Cardiac Enzymes No results for input(s): CKMB, TROPONINI, MYOGLOBIN in the last 168 hours.  Invalid input(s): CK ------------------------------------------------------------------------------------------------------------------ Invalid input(s): POCBNP    Assessment & Plan   IMPRESSION AND PLAN: Patient 78 year old presenting with abdominal pain confusion and hypoxia  *Bacteroids fragilis bacteremia Likely GI source but  patient has no acute findings on CT abd other than thickened urinary bladder with h/o bladder cancer and radiation No fistulas or abscesses On IV Unasyn. Consult ID. Appreciate help Ordered Cystogram for fistula which can be done on Monday. Ct should have showed fistula but can miss.  *Community-acquired pneumonia.  Has been on IV antibiotics.  Improved  *E. coli UTI. On IV Unasyn  * Acute encephalopathy  No significant improvement Mri shows no acute abnormality  eeg pending  appreciate neurology and psychiatry input Slowly improving  *  End-stage renal disease nephrology has been consulted for hemodialysis continue PhosLo and Sensipar  *  Diabetes type 2 continue sliding-scale insulin  *  Sleep apnea CPAP at bedtime  *  GERD Protonix   *  hypertension  BP meds held and started on clonidine patch. Will d/c patch today and restart losartan 100 mg that she was on at home Monitor for rebound HTN     Code Status Orders  (From admission, onward)         Start     Ordered   05/11/19 2137  Full code  Continuous     05/11/19 2136        Code Status History    Date Active Date Inactive Code Status Order ID Comments User Context   07/27/2018 1819 07/30/2018 1640 Full Code [id number]  Sela Hua, MD Inpatient   05/10/2018 1831 05/13/2018 2249 Full Code [id number]  Hillary Bow, MD ED   01/24/2017 2348 01/29/2017 1614 Full Code [id number]  [place], Ubaldo Glassing, DO Inpatient   11/19/2016 1528  11/23/2016 2156 Full Code [id number]  Loletha Grayer, MD Inpatient   06/05/2016 0108 06/08/2016 1345 Full Code [id number]  Lance Coon, MD Inpatient   05/12/2016 1432 05/15/2016 1746 Full Code [id number]  Lytle Butte, MD Inpatient   05/04/2016 1455 05/09/2016 1828 Full Code [id number]  Epifanio Lesches, MD ED   04/17/2016 1336 04/20/2016 2041 Full Code [id number]  Demetrios Loll, MD Inpatient   03/04/2016 1358 03/10/2016 1440 Full Code [id number]  Hillary Bow, MD ED   01/21/2016 2115  01/27/2016 1801 Full Code [id number]  Edwin Dada, MD Inpatient   01/20/2016 1841 01/21/2016 2115 Full Code [id number]  Theodoro Grist, MD Inpatient   12/18/2015 0214 12/23/2015 1651 Full Code [id number]  Saundra Shelling, MD [facility]     Consults  Neurology, Palliative care, Nephrology  DVT Prophylaxis  Heparin  Lab Results  Component Value Date   PLT 282 05/17/2019   Time Spent in minutes  30mn  SLeia AlfSudini M.D on 05/20/2019 at 7:34 AM  Between 7am to 6pm - Pager - [phone]  After 6pm go to [email or web address] - [email or web address]   Office  [phone]

## 2019-05-20 NOTE — Progress Notes (Signed)
[facility], [place] 05/20/19  Subjective:    More alert today and appears to be back to baseline.  Reports slipping out of bed this morning.  Denies any acute pain Denies SOB Able to eat without nausea or vomiting.  When asked why she did not finish her breakfast she jokingly replies "she is watching her figure"    Objective:  Vital signs in last 24 hours:  Temp:  [97.4 F (36.3 C)-98.4 F (36.9 C)] 97.5 F (36.4 C) (10/31 0849) Pulse Rate:  [75-100] 80 (10/31 0849) Resp:  [16-27] 16 (10/31 0849) BP: (83-114)/(41-94) 112/48 (10/31 0849) SpO2:  [96 %-100 %] 98 % (10/31 0849)  Weight change:  Filed Weights   05/15/19 1215 05/15/19 1220 05/17/19 0945  Weight: 76.1 kg 76.1 kg 74.3 kg    Intake/Output:    Intake/Output Summary (Last 24 hours) at 05/20/2019 1107 Last data filed at 05/20/2019 8315 Gross per 24 hour  Intake 457 ml  Output -248 ml  Net 705 ml     Physical Exam: General:   no acute distress  HEENT  moist oral mucous membranes  Pulm/lungs  normal breathing effort, clear, [place] O2  CVS/Heart  no rub  Abdomen:   Soft, nontender  Extremities:  No peripheral edema  Neurologic:  Able to answer questions and appears to be back to baseline  Skin:  Warm, dry  Access:  Left arm AV graft       Basic Metabolic Panel:  Recent Labs  Lab 05/15/19 2123 05/17/19 0930  NA 142 146*  K 4.4 4.7  CL 100 101  CO2 24 23  GLUCOSE 99 101*  BUN 58* 107*  CREATININE 8.84* 12.97*  CALCIUM 9.3 9.1  MG 2.1  --   PHOS  --  7.6*     CBC: Recent Labs  Lab 05/17/19 0930  WBC 9.1  HGB 10.1*  HCT 33.6*  MCV 100.6*  PLT 282      Lab Results  Component Value Date   HEPBSAG NON REACTIVE 05/12/2019      Microbiology:  Recent Results (from the past 240 hour(s))  Blood Culture (routine x 2)     Status: Abnormal   Collection Time: 05/11/19 11:21 AM   Specimen: BLOOD  Result Value Ref Range Status   Specimen Description BLOOD  RIGHT ANTECUBITAL  Final   Special Requests   Final    BOTTLES DRAWN AEROBIC AND ANAEROBIC Blood Culture adequate volume   Culture  Setup Time   Final    GRAM NEGATIVE RODS ANAEROBIC BOTTLE ONLY CRITICAL RESULT CALLED TO, READ BACK BY AND VERIFIED WITH: JASON ROBBINS AT 1644 05/12/2019 KMP    Culture BACTEROIDES FRAGILIS BETA LACTAMASE POSITIVE  (A)  Final   Report Status 05/14/2019 FINAL  Final  Urine culture     Status: Abnormal   Collection Time: 05/11/19 11:21 AM   Specimen: Urine, Random  Result Value Ref Range Status   Specimen Description   Final    URINE, RANDOM Performed at [facility], [address], [place], [place] [address]    Special Requests   Final    NONE Performed at [facility], [address] [place], [place] [address]    Culture >=100,000 COLONIES/mL ESCHERICHIA COLI (A)  Final   Report Status 05/13/2019 FINAL  Final   Organism ID, Bacteria ESCHERICHIA COLI (A)  Final      Susceptibility   Escherichia coli - MIC*    AMPICILLIN <=2 SENSITIVE  Sensitive     CEFAZOLIN <=4 SENSITIVE Sensitive     CEFTRIAXONE <=1 SENSITIVE Sensitive     CIPROFLOXACIN <=0.25 SENSITIVE Sensitive     GENTAMICIN <=1 SENSITIVE Sensitive     IMIPENEM <=0.25 SENSITIVE Sensitive     NITROFURANTOIN <=16 SENSITIVE Sensitive     TRIMETH/SULFA <=20 SENSITIVE Sensitive     AMPICILLIN/SULBACTAM <=2 SENSITIVE Sensitive     PIP/TAZO <=4 SENSITIVE Sensitive     Extended ESBL NEGATIVE Sensitive     * >=100,000 COLONIES/mL ESCHERICHIA COLI  Blood Culture ID Panel (Reflexed)     Status: None   Collection Time: 05/11/19 11:21 AM  Result Value Ref Range Status   Enterococcus species NOT DETECTED NOT DETECTED Final   Listeria monocytogenes NOT DETECTED NOT DETECTED Final   Staphylococcus species NOT DETECTED NOT DETECTED Final   Staphylococcus aureus (BCID) NOT DETECTED NOT DETECTED Final   Streptococcus species NOT DETECTED NOT DETECTED Final   Streptococcus  agalactiae NOT DETECTED NOT DETECTED Final   Streptococcus pneumoniae NOT DETECTED NOT DETECTED Final   Streptococcus pyogenes NOT DETECTED NOT DETECTED Final   Acinetobacter baumannii NOT DETECTED NOT DETECTED Final   Enterobacteriaceae species NOT DETECTED NOT DETECTED Final   Enterobacter cloacae complex NOT DETECTED NOT DETECTED Final   Escherichia coli NOT DETECTED NOT DETECTED Final   Klebsiella oxytoca NOT DETECTED NOT DETECTED Final   Klebsiella pneumoniae NOT DETECTED NOT DETECTED Final   Proteus species NOT DETECTED NOT DETECTED Final   Serratia marcescens NOT DETECTED NOT DETECTED Final   Haemophilus influenzae NOT DETECTED NOT DETECTED Final   Neisseria meningitidis NOT DETECTED NOT DETECTED Final   Pseudomonas aeruginosa NOT DETECTED NOT DETECTED Final   Candida albicans NOT DETECTED NOT DETECTED Final   Candida glabrata NOT DETECTED NOT DETECTED Final   Candida krusei NOT DETECTED NOT DETECTED Final   Candida parapsilosis NOT DETECTED NOT DETECTED Final   Candida tropicalis NOT DETECTED NOT DETECTED Final    Comment: Performed at [facility], [address] [place], [place] [address]  SARS Coronavirus 2 by RT PCR (hospital order, performed in [place] hospital lab) Nasopharyngeal Nasopharyngeal Swab     Status: None   Collection Time: 05/11/19 12:01 PM   Specimen: Nasopharyngeal Swab  Result Value Ref Range Status   SARS Coronavirus 2 NEGATIVE NEGATIVE Final    Comment: (NOTE) If result is NEGATIVE SARS-CoV-2 target nucleic acids are NOT DETECTED. The SARS-CoV-2 RNA is generally detectable in upper and lower  respiratory specimens during the acute phase of infection. The lowest  concentration of SARS-CoV-2 viral copies this assay can detect is 250  copies / mL. A negative result does not preclude SARS-CoV-2 infection  and should not be used as the sole basis for treatment or other  patient management decisions.  A negative result may occur with  improper  specimen collection / handling, submission of specimen other  than nasopharyngeal swab, presence of viral mutation(s) within the  areas targeted by this assay, and inadequate number of viral copies  (<250 copies / mL). A negative result must be combined with clinical  observations, patient history, and epidemiological information. If result is POSITIVE SARS-CoV-2 target nucleic acids are DETECTED. The SARS-CoV-2 RNA is generally detectable in upper and lower  respiratory specimens dur ing the acute phase of infection.  Positive  results are indicative of active infection with SARS-CoV-2.  Clinical  correlation with patient history and other diagnostic information is  necessary to determine patient infection status.  Positive results do  not rule out bacterial infection or co-infection with other viruses. If result is PRESUMPTIVE POSTIVE SARS-CoV-2 nucleic acids MAY BE PRESENT.   A presumptive positive result was obtained on the submitted specimen  and confirmed on repeat testing.  While 2019 novel coronavirus  (SARS-CoV-2) nucleic acids may be present in the submitted sample  additional confirmatory testing may be necessary for epidemiological  and / or clinical management purposes  to differentiate between  SARS-CoV-2 and other Sarbecovirus currently known to infect humans.  If clinically indicated additional testing with an alternate test  methodology [phone]) is advised. The SARS-CoV-2 RNA is generally  detectable in upper and lower respiratory sp ecimens during the acute  phase of infection. The expected result is Negative. Fact Sheet for Patients:  StrictlyIdeas.no Fact Sheet for Healthcare Providers: BankingDealers.co.za This test is not yet approved or cleared by the [place] FDA and has been authorized for detection and/or diagnosis of SARS-CoV-2 by FDA under an Emergency Use Authorization (EUA).  This EUA will remain in  effect (meaning this test can be used) for the duration of the COVID-19 declaration under Section 564(b)(1) of the Act, 21 U.S.C. section 360bbb-3(b)(1), unless the authorization is terminated or revoked sooner. Performed at [facility], [address] [place], [place] [address]   Blood Culture (routine x 2)     Status: None   Collection Time: 05/11/19  9:59 PM   Specimen: BLOOD  Result Value Ref Range Status   Specimen Description BLOOD BLOOD RIGHT HAND  Final   Special Requests   Final    BOTTLES DRAWN AEROBIC AND ANAEROBIC Blood Culture adequate volume   Culture   Final    NO GROWTH 5 DAYS Performed at [facility], [address] [place], [place] [address]    Report Status 05/16/2019 FINAL  Final  MRSA PCR Screening     Status: Abnormal   Collection Time: 05/12/19  7:01 PM   Specimen: Nasal Mucosa; Nasopharyngeal  Result Value Ref Range Status   MRSA by PCR POSITIVE (A) NEGATIVE Final    Comment:        The GeneXpert MRSA Assay (FDA approved for NASAL specimens only), is one component of a comprehensive MRSA colonization surveillance program. It is not intended to diagnose MRSA infection nor to guide or monitor treatment for MRSA infections. CRITICAL RESULT CALLED TO, READ BACK BY AND VERIFIED WITH: Jacqulynn Cadet AT 2103 05/12/2019 KMP Performed at [facility], [address] [place], [place] [address]     Coagulation Studies: No results for input(s): LABPROT, INR in the last 72 hours.  Urinalysis: No results for input(s): COLORURINE, LABSPEC, PHURINE, GLUCOSEU, HGBUR, BILIRUBINUR, KETONESUR, PROTEINUR, UROBILINOGEN, NITRITE, LEUKOCYTESUR in the last 72 hours.  Invalid input(s): APPERANCEUR    Imaging: No results found.   Medications:   . sodium chloride 500 mL (05/20/19 0456)  . ampicillin-sulbactam (UNASYN) IV 3 g (05/20/19 0459)   . chlorhexidine  15 mL Mouth/Throat Q4H  . feeding supplement (NEPRO CARB STEADY)  237  mL Oral BID BM  . heparin  5,000 Units Subcutaneous Q8H  . influenza vaccine adjuvanted  0.5 mL Intramuscular Tomorrow-1000  . lidocaine (PF)  5 mL Intradermal Once  . losartan  100 mg Oral Daily  . multivitamin  1 tablet Oral QHS  . mupirocin ointment  1 application Nasal BID  . pantoprazole (PROTONIX) IV  40 mg Intravenous Q12H  . sodium chloride flush  3 mL Intravenous Q12H  .  vitamin C  500 mg Oral BID   sodium chloride, acetaminophen **OR** acetaminophen, albuterol, iohexol  Assessment/ Plan:  78 y.o. African-American female  with end stage renal disease on hemodialysis, hypertension, diabetes mellitus type II, diabetic neuropathy who is admitted to [facility] on 05/11/2019 for [facility] MWF-1//TW84kg//[facility]//left arm AV graft  1.  End-stage renal disease -Next hemodialysis planned for Monday  2.  Sepsis with altered mental status Blood cultures from 10/22 are positive for Bacteroides.  Urine culture positive for E. coli greater than 100,000 colonies CT negative for stroke Neurology team following Improving-appears to be back to baseline Iv UNASYN  3.  Anemia of chronic kidney disease Lab Results  Component Value Date   HGB 10.1 (L) 05/17/2019  Continue low-dose Epogen with hemodialysis  4.  Secondary hyperparathyroidism Lab Results  Component Value Date   CALCIUM 9.1 05/17/2019   PHOS 7.6 (H) 05/17/2019   Monitor calcium and phosphorus closely May be able to restart home dose of binders-calcium acetate 2 capsules with meals       LOS: Damon 10/31/202011:07 AM  [place], [place]  Note: This note was prepared with Dragon dictation. Any transcription errors are unintentional

## 2019-05-21 DIAGNOSIS — B9689 Other specified bacterial agents as the cause of diseases classified elsewhere: Secondary | ICD-10-CM

## 2019-05-21 DIAGNOSIS — G9341 Metabolic encephalopathy: Secondary | ICD-10-CM

## 2019-05-21 DIAGNOSIS — J181 Lobar pneumonia, unspecified organism: Secondary | ICD-10-CM

## 2019-05-21 DIAGNOSIS — A498 Other bacterial infections of unspecified site: Secondary | ICD-10-CM

## 2019-05-21 MED ORDER — PANTOPRAZOLE SODIUM 40 MG PO TBEC
40.0000 mg | DELAYED_RELEASE_TABLET | Freq: Two times a day (BID) | ORAL | Status: DC
  Administered 2019-05-21 – 2019-05-26 (×6): 40 mg via ORAL
  Filled 2019-05-21 (×11): qty 1

## 2019-05-21 MED ORDER — EPOETIN ALFA 10000 UNIT/ML IJ SOLN
4000.0000 [IU] | INTRAMUSCULAR | Status: DC
  Filled 2019-05-21: qty 0.4

## 2019-05-21 NOTE — Progress Notes (Signed)
Answers questions, knows name and date of birth. Taking deep breaths when encouraged. Will turn partially with assist in bed. Taking sips of liguids

## 2019-05-21 NOTE — NC FL2 (Signed)
[place] LEVEL OF CARE SCREENING TOOL     IDENTIFICATION  Patient Name: Barbara [name] Birthdate: 26-Aug-1940 Sex: female Admission Date (Current Location): 05/11/2019  [place] and [place] Number:  Engineering geologist and Address:  [facility], [address] [place],  [address]      Provider Number: [id number]  Attending Physician Name and Address:  Loletha Grayer, MD  Relative Name and Phone Number:  [place], [place] [phone]    Current Level of Care: Hospital Recommended Level of Care: [place] Prior Approval Number:    Date Approved/Denied:   PASRR Number: [id number]  Discharge Plan: SNF    Current Diagnoses: Patient Active Problem List   Diagnosis Date Noted  . Somnolence   . PNA (pneumonia) 05/11/2019  . Dialysis patient ([place])   . Metabolic acidosis 32/35/5732  . Complication from renal dialysis device 01/28/2018  . History of colonic polyps 07/30/2017  . Presence of cardiac and vascular implant and graft   . Fever   . Sepsis ([place])   . End stage renal disease ([place]) 02/01/2017  . Sepsis secondary to UTI ([place]) 01/24/2017  . Acute kidney injury ([place]) 11/19/2016  . Anemia, chronic renal failure, stage 4 (severe) ([place]) 09/17/2016  . Hypoglycemia 06/04/2016  . Hyperkalemia 05/12/2016  . Protein-calorie malnutrition, severe 05/05/2016  . Acute on chronic renal failure ([place]) 05/04/2016  . Seizure ([place]) 04/17/2016  . Palliative care by specialist   . DNR (do not resuscitate) discussion   . C. difficile diarrhea 03/11/2016  . Anemia 03/11/2016  . Hypotension 03/11/2016  . Acidosis 03/11/2016  . Failure to thrive (child) 03/11/2016  . Weakness generalized 03/11/2016  . ARF (acute renal failure) ([place]) 03/04/2016  . Cancer of trigone of urinary bladder ([place]) 03/02/2016  . Absolute anemia 02/04/2016  . Airway hyperreactivity 02/04/2016  . Celiac disease 02/04/2016  . Gastric catarrh  02/04/2016  . Acid reflux 02/04/2016  . Combined fat and carbohydrate induced hyperlipemia 02/04/2016  . C. difficile colitis 01/22/2016  . Urinary obstruction 01/21/2016  . COPD (chronic obstructive pulmonary disease) ([place]) 01/21/2016  . Controlled type 2 diabetes mellitus with stage 4 chronic kidney disease, with long-term current use of insulin ([place]) 01/21/2016  . Essential hypertension 01/21/2016  . Acute renal failure superimposed on stage 4 chronic kidney disease ([place]) 01/20/2016  . Anemia in chronic kidney disease 01/20/2016  . Arthritis of knee, degenerative 05/10/2015  . Knee strain 03/26/2015  . Other intervertebral disc displacement, lumbar region 03/04/2015  . Degenerative arthritis of lumbar spine 03/04/2015  . HNP (herniated nucleus pulposus), lumbar 03/04/2015  . Osteoarthritis of spine with radiculopathy, lumbar region 03/04/2015  . Injury of tendon of upper extremity 11/12/2014  . Atherosclerosis of abdominal aorta ([place]) 11/02/2014  . Chronic kidney disease, stage IV (severe) ([place]) 11/02/2014  . Obstructive apnea 11/02/2014  . Complete rotator cuff rupture of left shoulder 10/05/2014  . Arthritis of shoulder region, degenerative 10/05/2014  . CAD in native artery 12/08/2013  . Benign essential HTN 12/08/2013  . Type 2 diabetes mellitus ([place]) 12/08/2013    Orientation RESPIRATION BLADDER Height & Weight     Self, Place, Situation  Normal Incontinent Weight: 74.3 kg Height:  5' 8"  (172.7 cm)  BEHAVIORAL SYMPTOMS/MOOD NEUROLOGICAL BOWEL NUTRITION STATUS      Continent    AMBULATORY STATUS COMMUNICATION OF NEEDS Skin   Extensive Assist Verbally Skin abrasions, Bruising  Personal Care Assistance Level of Assistance  Bathing, Feeding, Dressing, Total care Bathing Assistance: Maximum assistance Feeding assistance: Limited assistance Dressing Assistance: Maximum assistance Total Care Assistance: Maximum assistance   Functional  Limitations Info  Sight, Hearing, Speech Sight Info: Adequate Hearing Info: Adequate Speech Info: Adequate    SPECIAL CARE FACTORS FREQUENCY  PT (By licensed PT), OT (By licensed OT)     PT Frequency: 5x per week OT Frequency: 5x per week            Contractures Contractures Info: Not present    Additional Factors Info  Allergies, Code Status Code Status Info: Full code Allergies Info: glipizide, percocet           Current Medications (05/21/2019):  This is the current hospital active medication list Current Facility-Administered Medications  Medication Dose Route Frequency Provider Last Rate Last Dose  . 0.9 %  sodium chloride infusion   Intravenous PRN Dustin Flock, MD   Stopped at 05/20/19 1925  . acetaminophen (TYLENOL) tablet 650 mg  650 mg Oral Q4H PRN Dustin Flock, MD       Or  . acetaminophen (TYLENOL) suppository 650 mg  650 mg Rectal Q4H PRN Dustin Flock, MD   650 mg at 05/12/19 1962  . albuterol (PROVENTIL) (2.5 MG/3ML) 0.083% nebulizer solution 2.5 mg  2.5 mg Inhalation Q6H PRN Dustin Flock, MD      . Ampicillin-Sulbactam (UNASYN) 3 g in sodium chloride 0.9 % 100 mL IVPB  3 g Intravenous Q12H [place], Abby K, RPH 200 mL/hr at 05/21/19 0625 3 g at 05/21/19 0625  . calcium acetate (PHOSLO) capsule 1,334 mg  1,334 mg Oral TID WC Singh, Harmeet, MD      . chlorhexidine (PERIDEX) 0.12 % solution 15 mL  15 mL Mouth/Throat Q4H Lang Snow, NP   15 mL at 05/21/19 0332  . feeding supplement (NEPRO CARB STEADY) liquid 237 mL  237 mL Oral BID BM Sudini, Srikar, MD   237 mL at 05/20/19 1323  . heparin injection 5,000 Units  5,000 Units Subcutaneous Q8H Dustin Flock, MD   5,000 Units at 05/21/19 [phone]  . influenza vaccine adjuvanted (FLUAD) injection 0.5 mL  0.5 mL Intramuscular Tomorrow-1000 Dustin Flock, MD      . iohexol (OMNIPAQUE) 300 MG/ML solution 100 mL  100 mL Intravenous Once PRN Merlyn Lot, MD      . lidocaine (PF) (XYLOCAINE)  1 % injection 5 mL  5 mL Intradermal Once Merlyn Lot, MD      . losartan (COZAAR) tablet 100 mg  100 mg Oral Daily Sudini, Alveta Heimlich, MD      . multivitamin (RENA-VIT) tablet 1 tablet  1 tablet Oral QHS Hillary Bow, MD   1 tablet at 05/20/19 2112  . mupirocin ointment (BACTROBAN) 2 % 1 application  1 application Nasal BID Dustin Flock, MD   1 application at 98/92/11 2118  . pantoprazole (PROTONIX) injection 40 mg  40 mg Intravenous Q12H Dustin Flock, MD   40 mg at 05/20/19 2116  . sodium chloride flush (NS) 0.9 % injection 3 mL  3 mL Intravenous Q12H Dustin Flock, MD   3 mL at 05/20/19 2117  . vitamin C (ASCORBIC ACID) tablet 500 mg  500 mg Oral BID Hillary Bow, MD   500 mg at 05/20/19 2000     Discharge Medications: Please see discharge summary for a list of discharge medications.  Relevant Imaging Results:  Relevant Lab Results:   Additional Information ss # [id number]  Latanya Maudlin, RN

## 2019-05-21 NOTE — Progress Notes (Signed)
Patient ID: Barbara [name], female   DOB: 1940/09/20, 78 y.o.   MRN: [id number] [facility] PROGRESS NOTE  Barbara [name] XID:[id number] DOB: [birth year] DOA: 05/11/2019 PCP: [name], MD  HPI/Subjective: Patient answers a few yes or no questions.  As per nursing staff she is not eating very much.  She was going to try to feed her today.  Objective: Vitals:   05/21/19 0900 05/21/19 1239  BP: 121/60 (!) 121/42  Pulse: 73 78  Resp: 20 20  Temp: (!) 97.5 F (36.4 C) (!) 97.3 F (36.3 C)  SpO2: 96% 93%    Intake/Output Summary (Last 24 hours) at 05/21/2019 1526 Last data filed at 05/21/2019 0952 Gross per 24 hour  Intake 536.34 ml  Output -  Net 536.34 ml   Filed Weights   05/15/19 1215 05/15/19 1220 05/17/19 0945  Weight: 76.1 kg 76.1 kg 74.3 kg    ROS: Review of Systems  Constitutional: Negative for chills and fever.  Eyes: Negative for blurred vision.  Respiratory: Negative for cough and shortness of breath.   Cardiovascular: Negative for chest pain.  Gastrointestinal: Negative for abdominal pain, constipation, diarrhea, nausea and vomiting.  Genitourinary: Negative for dysuria.  Musculoskeletal: Negative for joint pain.  Neurological: Negative for dizziness and headaches.   Exam: Physical Exam  HENT:  Nose: No mucosal edema.  Mouth/Throat: No oropharyngeal exudate or posterior oropharyngeal edema.  Eyes: Pupils are equal, round, and reactive to light. Conjunctivae, EOM and lids are normal.  Neck: No JVD present. Carotid bruit is not present. No edema present. No thyroid mass and no thyromegaly present.  Cardiovascular: S1 normal and S2 normal. Exam reveals no gallop.  No murmur heard. Respiratory: No respiratory distress. She has decreased breath sounds in the right lower field and the left lower field. She has no wheezes. She has no rhonchi. She has no rales.  GI: Soft. Bowel sounds are normal. There is no abdominal tenderness.  Musculoskeletal:     Right  ankle: She exhibits swelling.     Left ankle: She exhibits swelling.  Lymphadenopathy:    She has no cervical adenopathy.  Neurological: She is alert. No cranial nerve deficit.  Skin: Skin is warm. No rash noted. Nails show no clubbing.  Psychiatric: She has a normal mood and affect.      Data Reviewed: Basic Metabolic Panel: Recent Labs  Lab 05/15/19 2123 05/17/19 0930  NA 142 146*  K 4.4 4.7  CL 100 101  CO2 24 23  GLUCOSE 99 101*  BUN 58* 107*  CREATININE 8.84* 12.97*  CALCIUM 9.3 9.1  MG 2.1  --   PHOS  --  7.6*   Liver Function Tests: Recent Labs  Lab 05/17/19 0930  ALBUMIN 3.1*   CBC: Recent Labs  Lab 05/17/19 0930  WBC 9.1  HGB 10.1*  HCT 33.6*  MCV 100.6*  PLT 282    CBG: Recent Labs  Lab 05/15/19 2022 05/19/19 1212  GLUCAP 99 101*    Recent Results (from the past 240 hour(s))  Blood Culture (routine x 2)     Status: None   Collection Time: 05/11/19  9:59 PM   Specimen: BLOOD  Result Value Ref Range Status   Specimen Description BLOOD BLOOD RIGHT HAND  Final   Special Requests   Final    BOTTLES DRAWN AEROBIC AND ANAEROBIC Blood Culture adequate volume   Culture   Final    NO GROWTH 5 DAYS Performed at [facility], [place]  [address], [place], [place] [address]    Report Status 05/16/2019 FINAL  Final  MRSA PCR Screening     Status: Abnormal   Collection Time: 05/12/19  7:01 PM   Specimen: Nasal Mucosa; Nasopharyngeal  Result Value Ref Range Status   MRSA by PCR POSITIVE (A) NEGATIVE Final    Comment:        The GeneXpert MRSA Assay (FDA approved for NASAL specimens only), is one component of a comprehensive MRSA colonization surveillance program. It is not intended to diagnose MRSA infection nor to guide or monitor treatment for MRSA infections. CRITICAL RESULT CALLED TO, READ BACK BY AND VERIFIED WITH: Jacqulynn Cadet AT 2103 05/12/2019 KMP Performed at [facility], [address] [place], [place] [address]       Scheduled Meds: . calcium acetate  1,334 mg Oral TID WC  . chlorhexidine  15 mL Mouth/Throat Q4H  . [START ON 05/22/2019] epoetin (EPOGEN/PROCRIT) injection  4,000 Units Intravenous Q M,W,F-HD  . feeding supplement (NEPRO CARB STEADY)  237 mL Oral BID BM  . heparin  5,000 Units Subcutaneous Q8H  . influenza vaccine adjuvanted  0.5 mL Intramuscular Tomorrow-1000  . lidocaine (PF)  5 mL Intradermal Once  . losartan  100 mg Oral Daily  . multivitamin  1 tablet Oral QHS  . mupirocin ointment  1 application Nasal BID  . pantoprazole  40 mg Oral BID  . sodium chloride flush  3 mL Intravenous Q12H  . vitamin C  500 mg Oral BID   Continuous Infusions: . sodium chloride Stopped (05/20/19 1925)  . ampicillin-sulbactam (UNASYN) IV 3 g (05/21/19 3845)    Assessment/Plan:  1. Bacteroides fragilis bacteremia.  Likely GI source that got into the urine.  Patient on IV Unasyn through 05/27/2019.  Cystogram ordered for Monday. 2. Community-acquired pneumonia on Unasyn 3. Acute metabolic encephalopathy.  Seems to be answering questions. 4. End-stage renal disease, dialysis as per nephrology. 5. Type 2 diabetes mellitus on sliding scale 6. Sleep apnea on CPAP 7. GERD on Protonix 8. Hypertension on losartan 9. Weakness.  Physical therapy recommends rehab.  Code Status:     Code Status Orders  (From admission, onward)         Start     Ordered   05/11/19 2137  Full code  Continuous     05/11/19 2136        Code Status History    Date Active Date Inactive Code Status Order ID Comments User Context   07/27/2018 1819 07/30/2018 1640 Full Code [id number]  Sela Hua, MD Inpatient   05/10/2018 1831 05/13/2018 2249 Full Code [id number]  Hillary Bow, MD ED   01/24/2017 2348 01/29/2017 1614 Full Code [id number]  [place], Holcomb, DO Inpatient   11/19/2016 1528 11/23/2016 2156 Full Code [id number]  Loletha Grayer, MD Inpatient   06/05/2016 0108 06/08/2016 1345 Full Code [id number]  Lance Coon, MD Inpatient   05/12/2016 1432 05/15/2016 1746 Full Code [id number]  Lytle Butte, MD Inpatient   05/04/2016 1455 05/09/2016 1828 Full Code [id number]  Epifanio Lesches, MD ED   04/17/2016 1336 04/20/2016 2041 Full Code [id number]  Demetrios Loll, MD Inpatient   03/04/2016 1358 03/10/2016 1440 Full Code [id number]  Hillary Bow, MD ED   01/21/2016 2115 01/27/2016 1801 Full Code [id number]  Edwin Dada, MD Inpatient   01/20/2016 1841 01/21/2016 2115 Full Code [id number]  Theodoro Grist, MD Inpatient   12/18/2015 0214 12/23/2015 1651 Full Code [id number]  Saundra Shelling, MD Inpatient  Advance Care Planning Activity     Family Communication: Tried to reach husband on the phone Disposition Plan: To be determined will need IV antibiotics through 05/27/2019  Consultants:  Nephrology  Infectious disease  Antibiotics:  IV Unasyn  Time spent: 28 minutes  [place]

## 2019-05-21 NOTE — TOC Progression Note (Signed)
Transition of Care [facility]) - Progression Note    Patient Details  Name: Barbara [name] MRN: [id number] Date of Birth: 02-Mar-1941  [facility]) CM/SW Contact  Latanya Maudlin, RN Phone Number: 05/21/2019, 8:27 AM  Clinical Narrative:  PT has recommended SNF. Patient still with noted confusion. Spoke to spouse Jeneen Rinks over the phone who thinks short term rehab is appropriate however the patient has been to rehab several times per spouse and he thinks she is likely to refuse. He plans to come visit today and speak with her about rehab placement. FL2 done, sent out through the [place] and awaiting bed offers.     Expected Discharge Plan: [facility]    Expected Discharge Plan and Services Expected Discharge Plan: [place]       Living arrangements for the past 2 months: Single Family Home                                       Social Determinants of Health (SDOH) Interventions    Readmission Risk Interventions No flowsheet data found.

## 2019-05-21 NOTE — Progress Notes (Signed)
[facility], [place] 05/21/19  Subjective:   Length of stay: 10 days  Patient's mental status appears to be back to baseline. Denies any nausea or vomiting No leg edema No shortness of breath Nurse reports oral intake is poor  Objective:  Vital signs in last 24 hours:  Temp:  [97.3 F (36.3 C)-98.2 F (36.8 C)] 97.3 F (36.3 C) (11/01 1239) Pulse Rate:  [70-78] 78 (11/01 1239) Resp:  [18-20] 20 (11/01 1239) BP: (92-121)/(42-79) 121/42 (11/01 1239) SpO2:  [93 %-98 %] 93 % (11/01 1239)  Weight change:  Filed Weights   05/15/19 1215 05/15/19 1220 05/17/19 0945  Weight: 76.1 kg 76.1 kg 74.3 kg    Intake/Output:    Intake/Output Summary (Last 24 hours) at 05/21/2019 1416 Last data filed at 05/21/2019 4801 Gross per 24 hour  Intake 536.34 ml  Output -  Net 536.34 ml     Physical Exam: General:   no acute distress  HEENT  moist oral mucous membranes  Pulm/lungs  normal breathing effort, clear, [place] O2  CVS/Heart  no rub  Abdomen:   Soft, nontender  Extremities:  No peripheral edema  Neurologic:  Able to answer questions and appears to be back to baseline  Skin:  Warm, dry  Access:  Left arm AV graft       Basic Metabolic Panel:  Recent Labs  Lab 05/15/19 2123 05/17/19 0930  NA 142 146*  K 4.4 4.7  CL 100 101  CO2 24 23  GLUCOSE 99 101*  BUN 58* 107*  CREATININE 8.84* 12.97*  CALCIUM 9.3 9.1  MG 2.1  --   PHOS  --  7.6*     CBC: Recent Labs  Lab 05/17/19 0930  WBC 9.1  HGB 10.1*  HCT 33.6*  MCV 100.6*  PLT 282      Lab Results  Component Value Date   HEPBSAG NON REACTIVE 05/12/2019      Microbiology:  Recent Results (from the past 240 hour(s))  Blood Culture (routine x 2)     Status: None   Collection Time: 05/11/19  9:59 PM   Specimen: BLOOD  Result Value Ref Range Status   Specimen Description BLOOD BLOOD RIGHT HAND  Final   Special Requests   Final    BOTTLES DRAWN AEROBIC AND ANAEROBIC Blood  Culture adequate volume   Culture   Final    NO GROWTH 5 DAYS Performed at [facility], [address] [place], [place] [address]    Report Status 05/16/2019 FINAL  Final  MRSA PCR Screening     Status: Abnormal   Collection Time: 05/12/19  7:01 PM   Specimen: Nasal Mucosa; Nasopharyngeal  Result Value Ref Range Status   MRSA by PCR POSITIVE (A) NEGATIVE Final    Comment:        The GeneXpert MRSA Assay (FDA approved for NASAL specimens only), is one component of a comprehensive MRSA colonization surveillance program. It is not intended to diagnose MRSA infection nor to guide or monitor treatment for MRSA infections. CRITICAL RESULT CALLED TO, READ BACK BY AND VERIFIED WITH: Jacqulynn Cadet AT 2103 05/12/2019 KMP Performed at [facility], [address] [place], [place] [address]     Coagulation Studies: No results for input(s): LABPROT, INR in the last 72 hours.  Urinalysis: No results for input(s): COLORURINE, LABSPEC, PHURINE, GLUCOSEU, HGBUR, BILIRUBINUR, KETONESUR, PROTEINUR, UROBILINOGEN, NITRITE, LEUKOCYTESUR in the last 72 hours.  Invalid input(s): APPERANCEUR    Imaging:  No results found.   Medications:   . sodium chloride Stopped (05/20/19 1925)  . ampicillin-sulbactam (UNASYN) IV 3 g (05/21/19 0625)   . calcium acetate  1,334 mg Oral TID WC  . chlorhexidine  15 mL Mouth/Throat Q4H  . feeding supplement (NEPRO CARB STEADY)  237 mL Oral BID BM  . heparin  5,000 Units Subcutaneous Q8H  . influenza vaccine adjuvanted  0.5 mL Intramuscular Tomorrow-1000  . lidocaine (PF)  5 mL Intradermal Once  . losartan  100 mg Oral Daily  . multivitamin  1 tablet Oral QHS  . mupirocin ointment  1 application Nasal BID  . pantoprazole  40 mg Oral BID  . sodium chloride flush  3 mL Intravenous Q12H  . vitamin C  500 mg Oral BID   sodium chloride, acetaminophen **OR** acetaminophen, albuterol, iohexol  Assessment/ Plan:  78 y.o. African-American  female  with end stage renal disease on hemodialysis, hypertension, diabetes mellitus type II, diabetic neuropathy who is admitted to [facility] on 05/11/2019 for [facility] MWF-1//TW84kg//[facility]//left arm AV graft  1.  End-stage renal disease -Next hemodialysis planned for Monday  2.  Sepsis with altered mental status Blood cultures from 10/22 are positive for Bacteroides.  Urine culture positive for E. coli greater than 100,000 colonies CT negative for stroke Neurology team following Improving-appears to be back to baseline Iv UNASYN  3.  Anemia of chronic kidney disease Lab Results  Component Value Date   HGB 10.1 (L) 05/17/2019  Continue low-dose Epogen with hemodialysis  4.  Secondary hyperparathyroidism Lab Results  Component Value Date   CALCIUM 9.1 05/17/2019   PHOS 7.6 (H) 05/17/2019   Monitor calcium and phosphorus closely May be able to restart home dose of binders-calcium acetate 2 capsules with meals  Disposition based on physical therapist evaluation    LOS: [place] 11/1/20202:16 PM  [place], [place]  Note: This note was prepared with Dragon dictation. Any transcription errors are unintentional

## 2019-05-22 ENCOUNTER — Inpatient Hospital Stay: Payer: Medicare Other

## 2019-05-22 DIAGNOSIS — N3040 Irradiation cystitis without hematuria: Secondary | ICD-10-CM

## 2019-05-22 DIAGNOSIS — Z9889 Other specified postprocedural states: Secondary | ICD-10-CM

## 2019-05-22 LAB — RENAL FUNCTION PANEL
Albumin: 3 g/dL — ABNORMAL LOW (ref 3.5–5.0)
Anion gap: 21 — ABNORMAL HIGH (ref 5–15)
BUN: 77 mg/dL — ABNORMAL HIGH (ref 8–23)
CO2: 22 mmol/L (ref 22–32)
Calcium: 8.9 mg/dL (ref 8.9–10.3)
Chloride: 98 mmol/L (ref 98–111)
Creatinine, Ser: 12.76 mg/dL — ABNORMAL HIGH (ref 0.44–1.00)
GFR calc Af Amer: 3 mL/min — ABNORMAL LOW (ref >60)
GFR calc non Af Amer: 2 mL/min — ABNORMAL LOW (ref >60)
Glucose, Bld: 106 mg/dL — ABNORMAL HIGH (ref 70–99)
Phosphorus: 8.3 mg/dL — ABNORMAL HIGH (ref 2.5–4.6)
Potassium: 3.9 mmol/L (ref 3.5–5.1)
Sodium: 141 mmol/L (ref 135–145)

## 2019-05-22 LAB — CBC
HCT: 29 % — ABNORMAL LOW (ref 36.0–46.0)
Hemoglobin: 9 g/dL — ABNORMAL LOW (ref 12.0–15.0)
MCH: 30.3 pg (ref 26.0–34.0)
MCHC: 31 g/dL (ref 30.0–36.0)
MCV: 97.6 fL (ref 80.0–100.0)
Platelets: 304 10*3/uL (ref 150–400)
RBC: 2.97 MIL/uL — ABNORMAL LOW (ref 3.87–5.11)
RDW: 13.8 % (ref 11.5–15.5)
WBC: 8 10*3/uL (ref 4.0–10.5)
nRBC: 0 % (ref 0.0–0.2)

## 2019-05-22 MED ORDER — IOTHALAMATE MEGLUMINE 17.2 % UR SOLN
100.0000 mL | Freq: Once | URETHRAL | Status: AC | PRN
  Administered 2019-05-22: 12:00:00 100 mL via URETHRAL

## 2019-05-22 MED ORDER — CHLORHEXIDINE GLUCONATE CLOTH 2 % EX PADS: 6.0000 | MEDICATED_PAD | Freq: Every day | CUTANEOUS | Status: DC

## 2019-05-22 NOTE — Progress Notes (Signed)
PT Cancellation Note  Patient Details Name: Barbara [name] MRN: [id number] DOB: Jun 04, 1941   Cancelled Treatment:    Reason Eval/Treat Not Completed: Patient at procedure or test/unavailable. Pt currently off the unit and not available for PT. PT will follow up as able and pt appropriate.   Zachary [name] PT, DPT 3:19 PM,05/22/19 [phone]

## 2019-05-22 NOTE — Progress Notes (Signed)
   05/22/19 1415  Neurological  Level of Consciousness Alert  Orientation Level Oriented to person;Oriented to situation  Respiratory  Respiratory Pattern Regular;Unlabored  Bilateral Breath Sounds Diminished  Cardiac  Pulse Regular  ECG Monitor Yes  PT ARRIVED TO UNIT VIA BED ALERT NO C/OS NO DISTRESS NOTED STABLE FOR TX

## 2019-05-22 NOTE — Progress Notes (Signed)
Date of Admission:  05/11/2019      ID: Barbara [name] is a 78 y.o. female  Active Problems:   ESRD (end stage renal disease) ([place])   Lobar pneumonia ([place])   Somnolence   Bacteremia due to other bacteria   Bacteroides fragilis infection   Acute metabolic encephalopathy    Subjective: Awake, some confusion Appetite poor Not  eating  Medications:  . calcium acetate  1,334 mg Oral TID WC  . chlorhexidine  15 mL Mouth/Throat Q4H  . [START ON 05/23/2019] Chlorhexidine Gluconate Cloth  6 each Topical Daily  . epoetin (EPOGEN/PROCRIT) injection  4,000 Units Intravenous Q M,W,F-HD  . feeding supplement (NEPRO CARB STEADY)  237 mL Oral BID BM  . heparin  5,000 Units Subcutaneous Q8H  . influenza vaccine adjuvanted  0.5 mL Intramuscular Tomorrow-1000  . losartan  100 mg Oral Daily  . multivitamin  1 tablet Oral QHS  . mupirocin ointment  1 application Nasal BID  . pantoprazole  40 mg Oral BID  . sodium chloride flush  3 mL Intravenous Q12H  . vitamin C  500 mg Oral BID    Objective: Vital signs in last 24 hours: Temp:  [97.5 F (36.4 C)-98.9 F (37.2 C)] 97.5 F (36.4 C) (11/02 1853) Pulse Rate:  [75-94] 94 (11/02 1853) Resp:  [16-23] 20 (11/02 1853) BP: (96-133)/(43-66) 120/49 (11/02 1853) SpO2:  [93 %-100 %] 97 % (11/02 1853)  PHYSICAL EXAM:  General: Awake, but  tenderness. Lips, mucosa, and tongue normal. No Thrush Neck: Supple, symmetrical, no adenopathy, thyroid: non tender no carotid bruit and no JVD. Back: No CVA tenderness. Lungs: b/l air entry Heart: Regular rate and rhythm, no murmur, rub or gallop. Abdomen: Soft, non-tender,not distended. Bowel sounds normal. No masses Extremities: atraumatic, no cyanosis. No edema. No clubbing Skin: No rashes or lesions. Or bruising Lymph: Cervical, supraclavicular normal. Neurologic: Grossly non-focal  Lab Results Recent Labs    05/22/19 0515  WBC 8.0  HGB 9.0*  HCT 29.0*  NA 141  K 3.9  CL 98  CO2 22  BUN  77*  CREATININE 12.76*   Liver Panel Recent Labs    05/22/19 0515  ALBUMIN 3.0*   Sedimentation Rate No results for input(s): ESRSEDRATE in the last 72 hours. C-Reactive Protein No results for input(s): CRP in the last 72 hours.  Microbiology:  Studies/Results: Dg Cystogram  Result Date: 05/22/2019 CLINICAL DATA:  History of bladder cancer. Evaluate for colovesical fistula EXAM: CYSTOGRAM TECHNIQUE: After catheterization of the urinary bladder following sterile technique the bladder was filled with 100 mL mL Cysto-Conray by drip infusion. Serial spot images were obtained during bladder filling. FLUOROSCOPY TIME:  Fluoroscopy Time:  2.0 minutes Radiation Exposure Index (if provided by the fluoroscopic device): 58.5 mGy Number of Acquired Spot Images: 5 full exposures COMPARISON:  CT 05/11/2019 FINDINGS: Examination was limited secondary to patient condition/discomfort. Patient presented to the Radiology Department with Foley catheter in place. Cysto-Conray iodinated contrast was attached to existing catheter and instilled via drip gravity. Only a very small amount of contrast surrounded the Foley catheter balloon within the bladder lumen. The luminal walls appear irregular and did not further distend despite continued installation of contrast. Contrast refluxed into the bilateral ureters which for dilated. Contrast extended into dilated bilateral renal pelvises. There was no obvious extraluminal contrast or evidence of colovesical fistula on the obtained images. Voiding images were unable to be obtained. IMPRESSION: 1. Limited exam.  No obvious colovesical fistula. 2. Very  small volume urinary bladder lumen with irregularity of the luminal walls. Findings may reflect fibrosis/scarring. 3. Contrast refluxed into dilated bilateral ureters and kidneys. Electronically Signed   By: Davina Poke M.D.   On: 05/22/2019 12:57     Assessment/Plan: Bacteroides bacteremia- this is a GI  organism-unclear  Where the breach was in the GI tract.  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 Could she have a colovesical fistula?  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

## 2019-05-22 NOTE — Progress Notes (Signed)
Pt went down for cystogram and needed foley. Verbal order from Dr. Leslye Peer to Discontinue foley when patient returned from cystogram

## 2019-05-22 NOTE — TOC Progression Note (Signed)
Transition of Care [facility]) - Progression Note    Patient Details  Name: Barbara [name] MRN: [id number] Date of Birth: 1940-09-21  [facility]) CM/SW Contact  [name], RN Phone Number: 05/22/2019, 3:12 PM  Clinical Narrative:     RNCM called again to present bed offers to husband.  No answer, no option to leave voicemail  Expected Discharge Plan: [place]    Expected Discharge Plan and Services Expected Discharge Plan: [place] arrangements for the past 2 months: Single Family Home                                       Social Determinants of Health (SDOH) Interventions    Readmission Risk Interventions No flowsheet data found.

## 2019-05-22 NOTE — Care Management Important Message (Signed)
Important Message  Patient Details  Name: Barbara [name] MRN: [id number] Date of Birth: April 13, 1941   Medicare Important Message Given:  Yes     [name] 05/22/2019, 11:26 AM

## 2019-05-22 NOTE — Progress Notes (Signed)
Patient ID: Barbara [name], female   DOB: [birth year], 78 y.o.   MRN: [id number] Vining L Sinko IRS:[id number] DOB: [birth year] DOA: 05/11/2019 PCP: [name], MD  HPI/Subjective: Patient seen while on dialysis.  Offers no complaints.  Feeling tired from today.  Objective: Vitals:   05/22/19 0512 05/22/19 1332  BP: (!) 118/49 (!) 129/50  Pulse: 75 82  Resp: 18 16  Temp: 97.8 F (36.6 C) 98.9 F (37.2 C)  SpO2: 100% 99%    Intake/Output Summary (Last 24 hours) at 05/22/2019 1501 Last data filed at 05/22/2019 1227 Gross per 24 hour  Intake 342.24 ml  Output -  Net 342.24 ml   Filed Weights   05/15/19 1215 05/15/19 1220 05/17/19 0945  Weight: 76.1 kg 76.1 kg 74.3 kg    ROS: Review of Systems  Unable to perform ROS: Medical condition  Respiratory: Negative for shortness of breath.   Cardiovascular: Negative for chest pain.  Gastrointestinal: Negative for abdominal pain.  Genitourinary: Negative for hematuria.   Exam: Physical Exam  HENT:  Nose: No mucosal edema.  Mouth/Throat: No oropharyngeal exudate or posterior oropharyngeal edema.  Eyes: Pupils are equal, round, and reactive to light. Conjunctivae, EOM and lids are normal.  Neck: No JVD present. Carotid bruit is not present. No edema present. No thyroid mass and no thyromegaly present.  Cardiovascular: S1 normal and S2 normal. Exam reveals no gallop.  No murmur heard. Respiratory: No respiratory distress. She has decreased breath sounds in the right lower field and the left lower field. She has no wheezes. She has no rhonchi. She has no rales.  GI: Soft. Bowel sounds are normal. There is no abdominal tenderness.  Musculoskeletal:     Right ankle: She exhibits swelling.     Left ankle: She exhibits swelling.  Lymphadenopathy:    She has no cervical adenopathy.  Neurological: She is alert. No cranial nerve deficit.  Skin: Skin is warm. No rash noted. Nails show no clubbing.   Psychiatric: She has a normal mood and affect.      Data Reviewed: Basic Metabolic Panel: Recent Labs  Lab 05/15/19 2123 05/17/19 0930 05/22/19 0515  NA 142 146* 141  K 4.4 4.7 3.9  CL 100 101 98  CO2 24 23 22   GLUCOSE 99 101* 106*  BUN 58* 107* 77*  CREATININE 8.84* 12.97* 12.76*  CALCIUM 9.3 9.1 8.9  MG 2.1  --   --   PHOS  --  7.6* 8.3*   Liver Function Tests: Recent Labs  Lab 05/17/19 0930 05/22/19 0515  ALBUMIN 3.1* 3.0*   CBC: Recent Labs  Lab 05/17/19 0930 05/22/19 0515  WBC 9.1 8.0  HGB 10.1* 9.0*  HCT 33.6* 29.0*  MCV 100.6* 97.6  PLT 282 304    CBG: Recent Labs  Lab 05/15/19 2022 05/19/19 1212  GLUCAP 99 101*    Recent Results (from the past 240 hour(s))  MRSA PCR Screening     Status: Abnormal   Collection Time: 05/12/19  7:01 PM   Specimen: Nasal Mucosa; Nasopharyngeal  Result Value Ref Range Status   MRSA by PCR POSITIVE (A) NEGATIVE Final    Comment:        The GeneXpert MRSA Assay (FDA approved for NASAL specimens only), is one component of a comprehensive MRSA colonization surveillance program. It is not intended to diagnose MRSA infection nor to guide or monitor treatment for MRSA infections. CRITICAL RESULT CALLED TO, READ BACK BY AND VERIFIED WITH:  Jacqulynn Cadet AT 2103 05/12/2019 KMP Performed at [facility], [address] [place], [place] [address]   CULTURE, BLOOD (ROUTINE X 2) w Reflex to ID Panel     Status: None (Preliminary result)   Collection Time: 05/21/19 11:36 AM   Specimen: BLOOD  Result Value Ref Range Status   Specimen Description BLOOD RAC  Final   Special Requests   Final    BOTTLES DRAWN AEROBIC AND ANAEROBIC Blood Culture results may not be optimal due to an excessive volume of blood received in culture bottles   Culture   Final    NO GROWTH < 24 HOURS Performed at [facility], [address] [place], [place] [address]    Report Status PENDING  Incomplete  CULTURE, BLOOD  (ROUTINE X 2) w Reflex to ID Panel     Status: None (Preliminary result)   Collection Time: 05/21/19 11:36 AM   Specimen: BLOOD  Result Value Ref Range Status   Specimen Description BLOOD RT HAND  Final   Special Requests   Final    BOTTLES DRAWN AEROBIC AND ANAEROBIC Blood Culture results may not be optimal due to an inadequate volume of blood received in culture bottles   Culture   Final    NO GROWTH < 24 HOURS Performed at [facility], [address], [place], [place] [address]    Report Status PENDING  Incomplete     Scheduled Meds: . calcium acetate  1,334 mg Oral TID WC  . chlorhexidine  15 mL Mouth/Throat Q4H  . [START ON 05/23/2019] Chlorhexidine Gluconate Cloth  6 each Topical Daily  . epoetin (EPOGEN/PROCRIT) injection  4,000 Units Intravenous Q M,W,F-HD  . feeding supplement (NEPRO CARB STEADY)  237 mL Oral BID BM  . heparin  5,000 Units Subcutaneous Q8H  . influenza vaccine adjuvanted  0.5 mL Intramuscular Tomorrow-1000  . losartan  100 mg Oral Daily  . multivitamin  1 tablet Oral QHS  . mupirocin ointment  1 application Nasal BID  . pantoprazole  40 mg Oral BID  . sodium chloride flush  3 mL Intravenous Q12H  . vitamin C  500 mg Oral BID   Continuous Infusions: . sodium chloride 30 mL (05/21/19 1809)  . ampicillin-sulbactam (UNASYN) IV 3 g (05/22/19 0517)    Assessment/Plan:  1. Bacteroides fragilis bacteremia.  Likely GI source that got into the urine.  Patient on IV Unasyn through 05/27/2019.  Cystogram was negative. 2. Community-acquired pneumonia on Unasyn 3. Acute metabolic encephalopathy.  Seems to be answering questions. 4. End-stage renal disease, dialysis as per nephrology. 5. Type 2 diabetes mellitus on sliding scale 6. Sleep apnea on CPAP 7. GERD on Protonix 8. Hypertension on losartan 9. Weakness.  Physical therapy recommends rehab.  Code Status:     Code Status Orders  (From admission, onward)         Start     Ordered    05/11/19 2137  Full code  Continuous     05/11/19 2136        Code Status History    Date Active Date Inactive Code Status Order ID Comments User Context   07/27/2018 1819 07/30/2018 1640 Full Code [id number]  Sela Hua, MD Inpatient   05/10/2018 1831 05/13/2018 2249 Full Code [id number]  Hillary Bow, MD ED   01/24/2017 2348 01/29/2017 1614 Full Code [id number]  Harvie Bridge, DO Inpatient   11/19/2016 1528 11/23/2016 2156 Full Code [id number]  Loletha Grayer, MD Inpatient  06/05/2016 0108 06/08/2016 1345 Full Code [id number]  Lance Coon, MD Inpatient   05/12/2016 1432 05/15/2016 1746 Full Code [id number]  Lytle Butte, MD Inpatient   05/04/2016 1455 05/09/2016 1828 Full Code [id number]  Epifanio Lesches, MD ED   04/17/2016 1336 04/20/2016 2041 Full Code [id number]  Demetrios Loll, MD Inpatient   03/04/2016 1358 03/10/2016 1440 Full Code [id number]  Hillary Bow, MD ED   01/21/2016 2115 01/27/2016 1801 Full Code [id number]  Edwin Dada, MD Inpatient   01/20/2016 1841 01/21/2016 2115 Full Code [id number]  Theodoro Grist, MD Inpatient   12/18/2015 0214 12/23/2015 1651 Full Code [id number]  Saundra Shelling, MD Inpatient   Advance Care Planning Activity     Family Communication: Tried to reach husband on the phone.  Tried to leave a message but not sure if I was able to get through. Disposition Plan: To be determined will need IV antibiotics through 05/27/2019.  May need a midline.  Need to speak with husband to see if he is interested in rehab.  Consultants:  Nephrology  Infectious disease  Antibiotics:  IV Unasyn  Time spent: 27 minutes  [place]

## 2019-05-22 NOTE — Progress Notes (Signed)
SLP Cancellation Note  Patient Details Name: Barbara [name] MRN: [id number] DOB: January 24, 1941   Cancelled treatment:       Reason Eval/Treat Not Completed: Patient at procedure or test/unavailable(chart reivewed; pt out of room at HD). Noted she ate ~25-50% of her Lunch meal left in her room -- new diet consistency of Puree w/ thin liquids.  ST services will f/u tomorrow w/ toleration of diet.     [name], MS, CCC-SLP Watson,Katherine 05/22/2019, 5:14 PM

## 2019-05-22 NOTE — TOC Progression Note (Signed)
Transition of Care [facility]) - Progression Note    Patient Details  Name: Barbara [name] MRN: [id number] Date of Birth: 06-06-1941  Transition of Care [facility]) CM/SW Contact  [name], RN Phone Number: 05/22/2019, 10:10 AM  Clinical Narrative:    RNCM called to present bed offers to husband.  No answer, no option to leave voicemail. RNCM to follow up   Expected Discharge Plan: [place]    Expected Discharge Plan and Services Expected Discharge Plan: [place]       Living arrangements for the past 2 months: Single Family Home                                       Social Determinants of Health (SDOH) Interventions    Readmission Risk Interventions No flowsheet data found.

## 2019-05-22 NOTE — Progress Notes (Signed)
   05/22/19 1800  Neurological  Level of Consciousness Alert  Orientation Level Oriented to person;Oriented to situation  Respiratory  Respiratory Pattern Regular;Unlabored  Bilateral Breath Sounds Diminished  Cardiac  Pulse Regular  ECG Monitor Yes  pt stable tolerated HD TX well no c/os no distress noted UFG 1L avg +/+.

## 2019-05-22 NOTE — TOC Progression Note (Addendum)
Transition of Care [facility]) - Progression Note    Patient Details  Name: Barbara [name] MRN: [id number] Date of Birth: 08-22-1940  Transition of Care [facility]) CM/SW Contact  [name], RN Phone Number: 05/22/2019, 5:05 PM  Clinical Narrative:     Bed offers presented to [facility].  RNCM initiated auth   RNCM confirmed with Tracie at [facility] they will be able to accept with Unasyn   Per tracie patient can come with a PIV in place to receive IV antibiotics   Expected Discharge Plan: [name]    Expected Discharge Plan and Services Expected Discharge Plan: [place] arrangements for the past 2 months: Single Family Home                                       Social Determinants of Health (SDOH) Interventions    Readmission Risk Interventions No flowsheet data found.

## 2019-05-22 NOTE — Progress Notes (Signed)
[facility], [place] 05/22/19  Subjective:  Patient confused this a.m. Due for hemodialysis today. Orders have been prepared.   Objective:  Vital signs in last 24 hours:  Temp:  [97.8 F (36.6 C)-98.9 F (37.2 C)] 98.9 F (37.2 C) (11/02 1332) Pulse Rate:  [75-82] 82 (11/02 1332) Resp:  [16-20] 16 (11/02 1332) BP: (118-129)/(49-55) 129/50 (11/02 1332) SpO2:  [97 %-100 %] 99 % (11/02 1332)  Weight change:  Filed Weights   05/15/19 1215 05/15/19 1220 05/17/19 0945  Weight: 76.1 kg 76.1 kg 74.3 kg    Intake/Output:    Intake/Output Summary (Last 24 hours) at 05/22/2019 1505 Last data filed at 05/22/2019 1227 Gross per 24 hour  Intake 342.24 ml  Output -  Net 342.24 ml     Physical Exam: General:  no acute distress  HEENT  moist oral mucous membranes  Pulm/lungs  clear bilateral, normal effort, [place] O2  CVS/Heart  no rub, S1S2  Abdomen:   Soft, nontender  Extremities:  No peripheral edema  Neurologic:  Awake, but appears confused  Skin:  Warm, dry  Access:  Left arm AV graft       Basic Metabolic Panel:  Recent Labs  Lab 05/15/19 2123 05/17/19 0930 05/22/19 0515  NA 142 146* 141  K 4.4 4.7 3.9  CL 100 101 98  CO2 24 23 22   GLUCOSE 99 101* 106*  BUN 58* 107* 77*  CREATININE 8.84* 12.97* 12.76*  CALCIUM 9.3 9.1 8.9  MG 2.1  --   --   PHOS  --  7.6* 8.3*     CBC: Recent Labs  Lab 05/17/19 0930 05/22/19 0515  WBC 9.1 8.0  HGB 10.1* 9.0*  HCT 33.6* 29.0*  MCV 100.6* 97.6  PLT 282 304      Lab Results  Component Value Date   HEPBSAG NON REACTIVE 05/12/2019      Microbiology:  Recent Results (from the past 240 hour(s))  MRSA PCR Screening     Status: Abnormal   Collection Time: 05/12/19  7:01 PM   Specimen: Nasal Mucosa; Nasopharyngeal  Result Value Ref Range Status   MRSA by PCR POSITIVE (A) NEGATIVE Final    Comment:        The GeneXpert MRSA Assay (FDA approved for NASAL specimens only), is one  component of a comprehensive MRSA colonization surveillance program. It is not intended to diagnose MRSA infection nor to guide or monitor treatment for MRSA infections. CRITICAL RESULT CALLED TO, READ BACK BY AND VERIFIED WITH: Jacqulynn Cadet AT 2103 05/12/2019 KMP Performed at [facility], [address] [place], [place] [address]   CULTURE, BLOOD (ROUTINE X 2) w Reflex to ID Panel     Status: None (Preliminary result)   Collection Time: 05/21/19 11:36 AM   Specimen: BLOOD  Result Value Ref Range Status   Specimen Description BLOOD RAC  Final   Special Requests   Final    BOTTLES DRAWN AEROBIC AND ANAEROBIC Blood Culture results may not be optimal due to an excessive volume of blood received in culture bottles   Culture   Final    NO GROWTH < 24 HOURS Performed at [facility], [address] [place], [place] [address]    Report Status PENDING  Incomplete  CULTURE, BLOOD (ROUTINE X 2) w Reflex to ID Panel     Status: None (Preliminary result)   Collection Time: 05/21/19 11:36 AM   Specimen: BLOOD  Result Value Ref Range  Status   Specimen Description BLOOD RT HAND  Final   Special Requests   Final    BOTTLES DRAWN AEROBIC AND ANAEROBIC Blood Culture results may not be optimal due to an inadequate volume of blood received in culture bottles   Culture   Final    NO GROWTH < 24 HOURS Performed at [facility], [address] [place], [place] [address]    Report Status PENDING  Incomplete    Coagulation Studies: No results for input(s): LABPROT, INR in the last 72 hours.  Urinalysis: No results for input(s): COLORURINE, LABSPEC, PHURINE, GLUCOSEU, HGBUR, BILIRUBINUR, KETONESUR, PROTEINUR, UROBILINOGEN, NITRITE, LEUKOCYTESUR in the last 72 hours.  Invalid input(s): APPERANCEUR    Imaging: Dg Cystogram  Result Date: 05/22/2019 CLINICAL DATA:  History of bladder cancer. Evaluate for colovesical fistula EXAM: CYSTOGRAM TECHNIQUE: After  catheterization of the urinary bladder following sterile technique the bladder was filled with 100 mL mL Cysto-Conray by drip infusion. Serial spot images were obtained during bladder filling. FLUOROSCOPY TIME:  Fluoroscopy Time:  2.0 minutes Radiation Exposure Index (if provided by the fluoroscopic device): 58.5 mGy Number of Acquired Spot Images: 5 full exposures COMPARISON:  CT 05/11/2019 FINDINGS: Examination was limited secondary to patient condition/discomfort. Patient presented to the Radiology Department with Foley catheter in place. Cysto-Conray iodinated contrast was attached to existing catheter and instilled via drip gravity. Only a very small amount of contrast surrounded the Foley catheter balloon within the bladder lumen. The luminal walls appear irregular and did not further distend despite continued installation of contrast. Contrast refluxed into the bilateral ureters which for dilated. Contrast extended into dilated bilateral renal pelvises. There was no obvious extraluminal contrast or evidence of colovesical fistula on the obtained images. Voiding images were unable to be obtained. IMPRESSION: 1. Limited exam.  No obvious colovesical fistula. 2. Very small volume urinary bladder lumen with irregularity of the luminal walls. Findings may reflect fibrosis/scarring. 3. Contrast refluxed into dilated bilateral ureters and kidneys. Electronically Signed   By: Davina Poke M.D.   On: 05/22/2019 12:57     Medications:   . sodium chloride 30 mL (05/21/19 1809)  . ampicillin-sulbactam (UNASYN) IV 3 g (05/22/19 0517)   . calcium acetate  1,334 mg Oral TID WC  . chlorhexidine  15 mL Mouth/Throat Q4H  . [START ON 05/23/2019] Chlorhexidine Gluconate Cloth  6 each Topical Daily  . epoetin (EPOGEN/PROCRIT) injection  4,000 Units Intravenous Q M,W,F-HD  . feeding supplement (NEPRO CARB STEADY)  237 mL Oral BID BM  . heparin  5,000 Units Subcutaneous Q8H  . influenza vaccine adjuvanted  0.5 mL  Intramuscular Tomorrow-1000  . losartan  100 mg Oral Daily  . multivitamin  1 tablet Oral QHS  . mupirocin ointment  1 application Nasal BID  . pantoprazole  40 mg Oral BID  . sodium chloride flush  3 mL Intravenous Q12H  . vitamin C  500 mg Oral BID   sodium chloride, acetaminophen **OR** acetaminophen, albuterol, iohexol  Assessment/ Plan:  78 y.o. African-American female  with end stage renal disease on hemodialysis, hypertension, diabetes mellitus type II, diabetic neuropathy who is admitted to [facility] on 05/11/2019 for [facility] MWF-1//TW84kg//[facility]//left arm AV graft  1.  End-stage renal disease -Pt due for dialysis.  Orders have been prepared.  2.  Sepsis with altered mental status Blood cultures from 10/22 are positive for Bacteroides.  Urine culture positive for E. coli greater than 100,000 colonies CT negative for stroke  Neurology team following A bit more confused today.  Maintain the patient on Unasyn.  3.  Anemia of chronic kidney disease Lab Results  Component Value Date   HGB 9.0 (L) 05/22/2019  Continue Epogen 4000 units IV with dialysis.  4.  Secondary hyperparathyroidism Lab Results  Component Value Date   CALCIUM 8.9 05/22/2019   PHOS 8.3 (H) 05/22/2019  Phosphorus noted to be high at 8.3.  Maintain the patient on calcium acetate 2 tablets p.o. 3 times daily with meals and continue to monitor bone mineral metabolism parameters closely.     LOS: 11 Kennede Lusk [place] 11/2/20203:05 PM  [place], [place]  Note: This note was prepared with Dragon dictation. Any transcription errors are unintentional

## 2019-05-23 ENCOUNTER — Encounter: Admission: RE | Payer: Self-pay | Source: Home / Self Care

## 2019-05-23 ENCOUNTER — Inpatient Hospital Stay: Admission: RE | Admit: 2019-05-23 | Payer: Medicare Other | Source: Home / Self Care | Admitting: Surgery

## 2019-05-23 LAB — SARS CORONAVIRUS 2 (TAT 6-24 HRS): SARS Coronavirus 2: NEGATIVE

## 2019-05-23 SURGERY — ARTHROPLASTY, HIP, TOTAL,POSTERIOR APPROACH
Anesthesia: Choice | Site: Hip | Laterality: Left

## 2019-05-23 NOTE — Progress Notes (Signed)
Due to patient relocating to [facility] for rehab, patient will need to transfer clinics. Medical records being sent to [facility] and [facility] for review.

## 2019-05-23 NOTE — Care Management (Signed)
Prior to admission patient lived at home with her husband.  Per Husband patient was able to ambulate independently with a walker

## 2019-05-23 NOTE — Progress Notes (Signed)
Physical Therapy Treatment Patient Details Name: Barbara [name] MRN: [id number] DOB: 13-Oct-1940 Today's Date: 05/23/2019    History of Present Illness  78 y.o. female with a known history of end-stage renal disease, chronic kidney disease stage IV, COPD, coronary artery disease, diabetes type 2, GERD with history of sleep apnea who was seen in the emergency room with complaint of abdominal pain and shortness of breath.  Pt with significant change in baseline mental status, that has fluctuated thus far this admission (11/22), imaging has revealed no acute abnormality.    PT Comments    Pt in bed, refusing medication with nursing upon arrival.  Encouraged participation in therapy today but generally resistant.  Attempted LE ex but pt did not assist with ex. She stated she needed to use the bathroom.  Commode obtained.  She resisted attempts at sitting EOB and assist to move legs towards the edge.  Arms remained crossed and defiant.  Further session deferred as multiple staff and multiple approaches were used to get pt to commode and to participate.   Follow Up Recommendations  SNF     Equipment Recommendations  Rolling walker with 5" wheels;3in1 (PT)    Recommendations for Other Services       Precautions / Restrictions Precautions Precautions: Fall Restrictions Weight Bearing Restrictions: No      PT Goals (current goals can now be found in the care plan section) Progress towards PT goals: Not progressing toward goals - comment    Frequency    Min 2X/week      PT Plan Current plan remains appropriate    Co-evaluation              AM-PAC PT "6 Clicks" Mobility   Outcome Measure  Help needed turning from your back to your side while in a flat bed without using bedrails?: Total Help needed moving from lying on your back to sitting on the side of a flat bed without using bedrails?: Total Help needed moving to and from a bed to a chair (including a wheelchair)?:  Total Help needed standing up from a chair using your arms (e.g., wheelchair or bedside chair)?: Total Help needed to walk in hospital room?: Total Help needed climbing 3-5 steps with a railing? : Total 6 Click Score: 6    End of Session   Activity Tolerance: Other (comment);Treatment limited secondary to agitation Patient left: in bed;with call bell/phone within reach;with bed alarm set;with family/visitor present Nurse Communication: Mobility status Pain - Right/Left: Right Pain - part of body: Arm     Time: 2902-1115 PT Time Calculation (min) (ACUTE ONLY): 13 min  Charges:  $Therapeutic Activity: 8-22 mins                     Chesley Noon, PTA 05/23/19, 11:31 AM

## 2019-05-23 NOTE — Progress Notes (Signed)
Nutrition Follow Up Note   DOCUMENTATION CODES:   Not applicable  INTERVENTION:   Nepro Shake po BID, each supplement provides 425 kcal and 19 grams protein  Rena-vite daily   Vitamin C 532m po BID  Pt is at high refeed risk   NUTRITION DIAGNOSIS:   Inadequate oral intake related to dysphagia as evidenced by NPO status.  GOAL:   Patient will meet greater than or equal to 90% of their needs  -not met   MONITOR:   PO intake, Supplement acceptance, Labs, Weight trends, Skin, I & O's  ASSESSMENT:   78y.o. female with a known history of end-stage renal disease on HD, bladder cancer, COPD, coronary artery disease, diabetes type 2, GERD with history of sleep apnea who was seen in the emergency room with complaint of abdominal pain and shortness of breath. 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  Pt placed on dysphagia 1 diet 10/30. Pt continues to have poor appetite and oral intake; pt eating <50% of meals and drinking some supplements. Palliative care following for GOC; family unsure if patient would want a feeding tube or not. Recommend continue supplements and vitamins. RD will monitor for GOC.   Per chart, pt down ~16lbs since admit  Medications reviewed and include: phoslo, epogen, heparin, rena-vite, protonix, vitamin C, unasyn   Labs reviewed: BUN 77(H), creat 12.76(H) P 8.3(H) Hgb 9.0(L), Hct 29.0(L)  Diet Order:   Diet Order            DIET - DYS 1 Room service appropriate? Yes with Assist; Fluid consistency: Thin  Diet effective now             EDUCATION NEEDS:   No education needs have been identified at this time  Skin:  Skin Assessment: Reviewed RN Assessment  Last BM:  11/2  Height:   Ht Readings from Last 1 Encounters:  05/11/19 5' 8" (1.727 m)    Weight:   Wt Readings from Last 1 Encounters:  05/17/19 74.3 kg    Ideal Body Weight:   63.6 kg  BMI:  Body mass index is 24.91 kg/m.  Estimated Nutritional Needs:   Kcal:  1700-1900kcal/day  Protein:  85-95g/day  Fluid:  UOP +1L  CKoleen DistanceMS, RD, LDN Pager #- [phone]#- [phone] Hours Pager: [phone]

## 2019-05-23 NOTE — Progress Notes (Signed)
Daily Progress Note   Patient Name: Barbara [name]       Date: 05/23/2019 DOB: Dec 15, 1940  Age: 78 y.o. MRN#: [id number] Attending Physician: Loletha Grayer, MD Primary Care Physician: [name], MD Admit Date: 05/11/2019  Reason for Consultation/Follow-up: Establishing goals of care  Subjective: Patient is resting in bed. She denies complaint.  Her husband is at bedside. He states he was advised she has an infection that could take about 4 days to clear up. He states he has been talking with her children daily, but does not want to discuss goals of care until 4 days from now, when her infection clears up. We discussed the importance of good oral intake. He cannot advise if she would ever want a feeding tube.   Length of Stay: 12  Current Medications: Scheduled Meds:  . calcium acetate  1,334 mg Oral TID WC  . chlorhexidine  15 mL Mouth/Throat Q4H  . Chlorhexidine Gluconate Cloth  6 each Topical Daily  . epoetin (EPOGEN/PROCRIT) injection  4,000 Units Intravenous Q M,W,F-HD  . feeding supplement (NEPRO CARB STEADY)  237 mL Oral BID BM  . heparin  5,000 Units Subcutaneous Q8H  . influenza vaccine adjuvanted  0.5 mL Intramuscular Tomorrow-1000  . losartan  100 mg Oral Daily  . multivitamin  1 tablet Oral QHS  . mupirocin ointment  1 application Nasal BID  . pantoprazole  40 mg Oral BID  . sodium chloride flush  3 mL Intravenous Q12H  . vitamin C  500 mg Oral BID    Continuous Infusions: . sodium chloride 250 mL (05/23/19 0757)  . ampicillin-sulbactam (UNASYN) IV 3 g (05/23/19 0554)    PRN Meds: sodium chloride, acetaminophen **OR** acetaminophen, albuterol, iohexol  Physical Exam Pulmonary:     Effort: Pulmonary effort is normal.  Skin:    General: Skin is warm and dry.   Neurological:     Mental Status: She is alert.             Vital Signs: BP (!) 104/47 (BP Location: Right Arm)   Pulse 87   Temp 98.1 F (36.7 C) (Oral)   Resp 20   Ht 5' 8"  (1.727 m)   Wt 74.3 kg   SpO2 97%   BMI 24.91 kg/m  SpO2: SpO2: 97 % O2 Device: O2 Device:  Room Air O2 Flow Rate: O2 Flow Rate (L/min): 2 L/min  Intake/output summary:   Intake/Output Summary (Last 24 hours) at 05/23/2019 1015 Last data filed at 05/23/2019 0700 Gross per 24 hour  Intake 103 ml  Output 500 ml  Net -397 ml   LBM: Last BM Date: 05/22/19 Baseline Weight: Weight: 82 kg Most recent weight: Weight: 74.3 kg       Palliative Assessment/Data:    Flowsheet Rows     Most Recent Value  Intake Tab  Referral Department  Hospitalist  Unit at Time of Referral  Med/Surg Unit  Palliative Care Primary Diagnosis  Nephrology  Date Notified  05/15/19  Palliative Care Type  Return patient Palliative Care  Reason for referral  Clarify Goals of Care  Date of Admission  05/11/19  Date first seen by Palliative Care  05/16/19  # of days Palliative referral response time  1 Day(s)  # of days IP prior to Palliative referral  4  Clinical Assessment  Psychosocial & Spiritual Assessment  Palliative Care Outcomes      Patient Active Problem List   Diagnosis Date Noted  . Bacteremia due to other bacteria   . Bacteroides fragilis infection   . Acute metabolic encephalopathy   . Somnolence   . Lobar pneumonia ([place]) 05/11/2019  . Dialysis patient ([place])   . Metabolic acidosis 93/81/0175  . Complication from renal dialysis device 01/28/2018  . History of colonic polyps 07/30/2017  . Presence of cardiac and vascular implant and graft   . Fever   . Sepsis ([place])   . ESRD (end stage renal disease) ([place]) 02/01/2017  . Sepsis secondary to UTI ([place]) 01/24/2017  . Acute kidney injury ([place]) 11/19/2016  . Anemia, chronic renal failure, stage 4 (severe) ([place]) 09/17/2016  . Hypoglycemia 06/04/2016  .  Hyperkalemia 05/12/2016  . Protein-calorie malnutrition, severe 05/05/2016  . Acute on chronic renal failure ([place]) 05/04/2016  . Seizure ([place]) 04/17/2016  . Palliative care by specialist   . DNR (do not resuscitate) discussion   . C. difficile diarrhea 03/11/2016  . Anemia 03/11/2016  . Hypotension 03/11/2016  . Acidosis 03/11/2016  . Failure to thrive (child) 03/11/2016  . Weakness generalized 03/11/2016  . ARF (acute renal failure) ([place]) 03/04/2016  . 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 Absolute anemia 02/04/2016  . Airway hyperreactivity 02/04/2016  . Celiac disease 02/04/2016  . Gastric catarrh 02/04/2016  . Acid reflux 02/04/2016  . Combined fat and carbohydrate induced hyperlipemia 02/04/2016  . C. difficile colitis 01/22/2016  . Urinary obstruction 01/21/2016  . COPD (chronic obstructive pulmonary disease) ([place]) 01/21/2016  . Controlled type 2 diabetes mellitus with stage 4 chronic kidney disease, with long-term current use of insulin ([place]) 01/21/2016  . Essential hypertension 01/21/2016  . Acute renal failure superimposed on stage 4 chronic kidney disease ([place]) 01/20/2016  . Anemia in chronic kidney disease 01/20/2016  . Arthritis of knee, degenerative 05/10/2015  . Knee strain 03/26/2015  . Other intervertebral disc displacement, lumbar region 03/04/2015  . Degenerative arthritis of lumbar spine 03/04/2015  . HNP (herniated nucleus pulposus), lumbar 03/04/2015  . Osteoarthritis of spine with radiculopathy, lumbar region 03/04/2015  . Injury of tendon of upper extremity 11/12/2014  . Atherosclerosis of abdominal aorta ([place]) 11/02/2014  . Chronic kidney disease, stage IV (severe) ([place]) 11/02/2014  . Obstructive apnea 11/02/2014  . Complete rotator cuff rupture of left shoulder 10/05/2014  . Arthritis of shoulder region, degenerative 10/05/2014  . CAD in  native artery 12/08/2013  . Benign essential HTN 12/08/2013  . Type 2 diabetes mellitus ([place])  12/08/2013    Palliative Care Assessment & Plan    Recommendations/Plan:  Recommend palliative at D/C.   Code Status:    Code Status Orders  (From admission, onward)         Start     Ordered   05/11/19 2137  Full code  Continuous     05/11/19 2136        Code Status History    Date Active Date Inactive Code Status Order ID Comments User Context   07/27/2018 1819 07/30/2018 1640 Full Code [id number]  Sela Hua, MD Inpatient   05/10/2018 1831 05/13/2018 2249 Full Code [id number]  Hillary Bow, MD ED   01/24/2017 2348 01/29/2017 1614 Full Code [id number]  [place], [place], DO Inpatient   11/19/2016 1528 11/23/2016 2156 Full Code [id number]  Loletha Grayer, MD Inpatient   06/05/2016 0108 06/08/2016 1345 Full Code [id number]  Lance Coon, MD Inpatient   05/12/2016 1432 05/15/2016 1746 Full Code [id number]  Lytle Butte, MD Inpatient   05/04/2016 1455 05/09/2016 1828 Full Code [id number]  Epifanio Lesches, MD ED   04/17/2016 1336 04/20/2016 2041 Full Code [id number]  Demetrios Loll, MD Inpatient   03/04/2016 1358 03/10/2016 1440 Full Code [id number]  Hillary Bow, MD ED   01/21/2016 2115 01/27/2016 1801 Full Code [id number]  Edwin Dada, MD Inpatient   01/20/2016 1841 01/21/2016 2115 Full Code [id number]  Theodoro Grist, MD Inpatient   12/18/2015 0214 12/23/2015 1651 Full Code [id number]  Saundra Shelling, MD Inpatient   Advance Care Planning Activity       Prognosis:  This depends on oral intake.     Care plan was discussed with Primary team and CM  Thank you for allowing the Palliative Medicine Team to assist in the care of this patient.   Total Time 35 min Prolonged Time Billed  no      Greater than 50%  of this time was spent counseling and coordinating care related to the above assessment and plan.  Asencion Gowda, NP  Please contact Palliative Medicine Team phone at [phone] for questions and concerns.

## 2019-05-23 NOTE — TOC Progression Note (Signed)
Transition of Care [facility]) - Progression Note    Patient Details  Name: Barbara [name] MRN: [id number] Date of Birth: 05-18-1941  [facility] CM/SW Contact  [name], RN Phone Number: 05/23/2019, 3:55 PM  Clinical Narrative:     Additional clinical faxed to [facility] HD liaison notified anticipated discharge to [facility] place.  She is working on switching HD centers   Expected Discharge Plan: [place]    Expected Discharge Plan and Services Expected Discharge Plan: [place] arrangements for the past 2 months: Single Family Home                                       Social Determinants of Health (SDOH) Interventions    Readmission Risk Interventions No flowsheet data found.

## 2019-05-23 NOTE — Progress Notes (Signed)
Patient ID: Barbara [name], female   DOB: 1941/07/07, 78 y.o.   MRN: [id number] Barbara [name] YME:[id number] DOB: 10-19-1940 DOA: 05/11/2019 PCP: [name], MD  HPI/Subjective: Patient answers a few questions.  She asked me not to touch her.  She was okay with me examining her but she did not want me to touch her legs.  Objective: Vitals:   05/23/19 0521 05/23/19 1323  BP: (!) 104/47 (!) 109/46  Pulse: 87 88  Resp: 20 20  Temp: 98.1 F (36.7 C)   SpO2: 97% 95%    Intake/Output Summary (Last 24 hours) at 05/23/2019 1436 Last data filed at 05/23/2019 1252 Gross per 24 hour  Intake 300 ml  Output 500 ml  Net -200 ml   Filed Weights   05/15/19 1215 05/15/19 1220 05/17/19 0945  Weight: 76.1 kg 76.1 kg 74.3 kg    ROS: Review of Systems  Unable to perform ROS: Medical condition  Respiratory: Negative for shortness of breath.   Cardiovascular: Negative for chest pain.  Gastrointestinal: Negative for abdominal pain.   Exam: Physical Exam  HENT:  Nose: No mucosal edema.  Mouth/Throat: No oropharyngeal exudate or posterior oropharyngeal edema.  Eyes: Pupils are equal, round, and reactive to light. Conjunctivae, EOM and lids are normal.  Neck: No JVD present. Carotid bruit is not present. No edema present. No thyroid mass and no thyromegaly present.  Cardiovascular: S1 normal and S2 normal. Exam reveals no gallop.  No murmur heard. Respiratory: No respiratory distress. She has decreased breath sounds in the right lower field and the left lower field. She has no wheezes. She has no rhonchi. She has no rales.  GI: Soft. Bowel sounds are normal. There is no abdominal tenderness.  Musculoskeletal:     Right ankle: She exhibits swelling.     Left ankle: She exhibits swelling.  Lymphadenopathy:    She has no cervical adenopathy.  Neurological: She is alert. No cranial nerve deficit.  Skin: Skin is warm. No rash noted. Nails show no clubbing.   Psychiatric: She has a normal mood and affect.      Data Reviewed: Basic Metabolic Panel: Recent Labs  Lab 05/17/19 0930 05/22/19 0515  NA 146* 141  K 4.7 3.9  CL 101 98  CO2 23 22  GLUCOSE 101* 106*  BUN 107* 77*  CREATININE 12.97* 12.76*  CALCIUM 9.1 8.9  PHOS 7.6* 8.3*   Liver Function Tests: Recent Labs  Lab 05/17/19 0930 05/22/19 0515  ALBUMIN 3.1* 3.0*   CBC: Recent Labs  Lab 05/17/19 0930 05/22/19 0515  WBC 9.1 8.0  HGB 10.1* 9.0*  HCT 33.6* 29.0*  MCV 100.6* 97.6  PLT 282 304    CBG: Recent Labs  Lab 05/19/19 1212  GLUCAP 101*    Recent Results (from the past 240 hour(s))  CULTURE, BLOOD (ROUTINE X 2) w Reflex to ID Panel     Status: None (Preliminary result)   Collection Time: 05/21/19 11:36 AM   Specimen: BLOOD  Result Value Ref Range Status   Specimen Description BLOOD RAC  Final   Special Requests   Final    BOTTLES DRAWN AEROBIC AND ANAEROBIC Blood Culture results may not be optimal due to an excessive volume of blood received in culture bottles   Culture   Final    NO GROWTH 2 DAYS Performed at [facility], [address], [place], [place] [address]    Report Status PENDING  Incomplete  CULTURE,  BLOOD (ROUTINE X 2) w Reflex to ID Panel     Status: None (Preliminary result)   Collection Time: 05/21/19 11:36 AM   Specimen: BLOOD  Result Value Ref Range Status   Specimen Description BLOOD RT HAND  Final   Special Requests   Final    BOTTLES DRAWN AEROBIC AND ANAEROBIC Blood Culture results may not be optimal due to an inadequate volume of blood received in culture bottles   Culture   Final    NO GROWTH 2 DAYS Performed at [facility], [address] [place], [place] [address]    Report Status PENDING  Incomplete  SARS CORONAVIRUS 2 (TAT 6-24 HRS) Nasopharyngeal Nasopharyngeal Swab     Status: None   Collection Time: 05/22/19 11:32 PM   Specimen: Nasopharyngeal Swab  Result Value Ref Range Status   SARS  Coronavirus 2 NEGATIVE NEGATIVE Final    Comment: (NOTE) SARS-CoV-2 target nucleic acids are NOT DETECTED. The SARS-CoV-2 RNA is generally detectable in upper and lower respiratory specimens during the acute phase of infection. Negative results do not preclude SARS-CoV-2 infection, do not rule out co-infections with other pathogens, and should not be used as the sole basis for treatment or other patient management decisions. Negative results must be combined with clinical observations, patient history, and epidemiological information. The expected result is Negative. Fact Sheet for Patients: SugarRoll.be Fact Sheet for Healthcare Providers: https://www.woods-mathews.com/ This test is not yet approved or cleared by the [place] FDA and  has been authorized for detection and/or diagnosis of SARS-CoV-2 by FDA under an Emergency Use Authorization (EUA). This EUA will remain  in effect (meaning this test can be used) for the duration of the COVID-19 declaration under Section 56 4(b)(1) of the Act, 21 U.S.C. section 360bbb-3(b)(1), unless the authorization is terminated or revoked sooner. Performed at [facility], [place] [address], [place], [place] [address]      Scheduled Meds: . calcium acetate  1,334 mg Oral TID WC  . chlorhexidine  15 mL Mouth/Throat Q4H  . Chlorhexidine Gluconate Cloth  6 each Topical Daily  . epoetin (EPOGEN/PROCRIT) injection  4,000 Units Intravenous Q M,W,F-HD  . feeding supplement (NEPRO CARB STEADY)  237 mL Oral BID BM  . heparin  5,000 Units Subcutaneous Q8H  . influenza vaccine adjuvanted  0.5 mL Intramuscular Tomorrow-1000  . losartan  100 mg Oral Daily  . multivitamin  1 tablet Oral QHS  . mupirocin ointment  1 application Nasal BID  . pantoprazole  40 mg Oral BID  . sodium chloride flush  3 mL Intravenous Q12H  . vitamin C  500 mg Oral BID   Continuous Infusions: . sodium chloride 250 mL (05/23/19  0757)  . ampicillin-sulbactam (UNASYN) IV 3 g (05/23/19 0554)    Assessment/Plan:  1. Bacteroides fragilis bacteremia.  Likely GI source that got into the urine.  Patient on IV Unasyn through 05/26/2019.  Cystogram was negative. 2. Community-acquired pneumonia on Unasyn 3. Acute metabolic encephalopathy.  Mental status waxes and wanes. 4. End-stage renal disease, dialysis as per nephrology. 5. Type 2 diabetes mellitus on sliding scale.  Patient's A1c is 6.2 and the patient's not eating very well. 6. Sleep apnea on CPAP 7. GERD on Protonix 8. Hypertension.  Will hold losartan with blood pressure being on the lower side 9. Weakness.  Physical therapy recommends rehab.  Spoke with husband on the phone he is interested in rehab.  There is only one bed available and they will need  to change dialysis sites and get insurance authorization first.  Repeat Covid negative.  Code Status:     Code Status Orders  (From admission, onward)         Start     Ordered   05/11/19 2137  Full code  Continuous     05/11/19 2136        Code Status History    Date Active Date Inactive Code Status Order ID Comments User Context   07/27/2018 1819 07/30/2018 1640 Full Code [id number]  Sela Hua, MD Inpatient   05/10/2018 1831 05/13/2018 2249 Full Code [id number]  Hillary Bow, MD ED   01/24/2017 2348 01/29/2017 1614 Full Code [id number]  [place], [place], DO Inpatient   11/19/2016 1528 11/23/2016 2156 Full Code [id number]  Loletha Grayer, MD Inpatient   06/05/2016 0108 06/08/2016 1345 Full Code [id number]  Lance Coon, MD Inpatient   05/12/2016 1432 05/15/2016 1746 Full Code [id number]  Lytle Butte, MD Inpatient   05/04/2016 1455 05/09/2016 1828 Full Code [id number]  Epifanio Lesches, MD ED   04/17/2016 1336 04/20/2016 2041 Full Code [id number]  Demetrios Loll, MD Inpatient   03/04/2016 1358 03/10/2016 1440 Full Code [id number]  Hillary Bow, MD ED   01/21/2016 2115 01/27/2016 1801 Full Code [id number]  Edwin Dada, MD Inpatient   01/20/2016 1841 01/21/2016 2115 Full Code [id number]  Theodoro Grist, MD Inpatient   12/18/2015 0214 12/23/2015 1651 Full Code [id number]  Saundra Shelling, MD Inpatient   Advance Care Planning Activity     Family Communication: Spoke with the husband on the phone around 7 AM.  He is interested in rehab. Disposition Plan: To be determined will need IV antibiotics through 05/26/2019.  Told he was okay taking the patient with a peripheral IV since its only few more days of IV antibiotics.  Will need insurance authorization and dialysis slot near the rehab center.  Consultants:  Nephrology  Infectious disease  Antibiotics:  IV Unasyn  Time spent: 28 minutes.  Spoke with husband on the phone.  [place]  [employer]

## 2019-05-23 NOTE — Progress Notes (Signed)
[place], [place] 05/23/19  Subjective:  Patient completed dialysis yesterday. Tolerated well. Still a bit confused today.   Objective:  Vital signs in last 24 hours:  Temp:  [97.5 F (36.4 C)-98.7 F (37.1 C)] 98.1 F (36.7 C) (11/03 0521) Pulse Rate:  [67-98] 88 (11/03 1323) Resp:  [17-23] 20 (11/03 1323) BP: (95-133)/(43-66) 109/46 (11/03 1323) SpO2:  [93 %-100 %] 95 % (11/03 1323)  Weight change:  Filed Weights   05/15/19 1215 05/15/19 1220 05/17/19 0945  Weight: 76.1 kg 76.1 kg 74.3 kg    Intake/Output:    Intake/Output Summary (Last 24 hours) at 05/23/2019 1413 Last data filed at 05/23/2019 1252 Gross per 24 hour  Intake 300 ml  Output 500 ml  Net -200 ml     Physical Exam: General:  no acute distress  HEENT  moist oral mucous membranes  Pulm/lungs  clear bilateral, normal effort, [place] O2  CVS/Heart  no rub, S1S2  Abdomen:   Soft, nontender  Extremities:  No peripheral edema  Neurologic:  Awake, but appears confused  Skin:  Warm, dry  Access:  Left arm AV graft       Basic Metabolic Panel:  Recent Labs  Lab 05/17/19 0930 05/22/19 0515  NA 146* 141  K 4.7 3.9  CL 101 98  CO2 23 22  GLUCOSE 101* 106*  BUN 107* 77*  CREATININE 12.97* 12.76*  CALCIUM 9.1 8.9  PHOS 7.6* 8.3*     CBC: Recent Labs  Lab 05/17/19 0930 05/22/19 0515  WBC 9.1 8.0  HGB 10.1* 9.0*  HCT 33.6* 29.0*  MCV 100.6* 97.6  PLT 282 304      Lab Results  Component Value Date   HEPBSAG NON REACTIVE 05/12/2019      Microbiology:  Recent Results (from the past 240 hour(s))  CULTURE, BLOOD (ROUTINE X 2) w Reflex to ID Panel     Status: None (Preliminary result)   Collection Time: 05/21/19 11:36 AM   Specimen: BLOOD  Result Value Ref Range Status   Specimen Description BLOOD RAC  Final   Special Requests   Final    BOTTLES DRAWN AEROBIC AND ANAEROBIC Blood Culture results may not be optimal due to an excessive volume of blood  received in culture bottles   Culture   Final    NO GROWTH 2 DAYS Performed at [facility], [address], [place], [place] [address]    Report Status PENDING  Incomplete  CULTURE, BLOOD (ROUTINE X 2) w Reflex to ID Panel     Status: None (Preliminary result)   Collection Time: 05/21/19 11:36 AM   Specimen: BLOOD  Result Value Ref Range Status   Specimen Description BLOOD RT HAND  Final   Special Requests   Final    BOTTLES DRAWN AEROBIC AND ANAEROBIC Blood Culture results may not be optimal due to an inadequate volume of blood received in culture bottles   Culture   Final    NO GROWTH 2 DAYS Performed at [facility], [address] [place], [place] [address]    Report Status PENDING  Incomplete  SARS CORONAVIRUS 2 (TAT 6-24 HRS) Nasopharyngeal Nasopharyngeal Swab     Status: None   Collection Time: 05/22/19 11:32 PM   Specimen: Nasopharyngeal Swab  Result Value Ref Range Status   SARS Coronavirus 2 NEGATIVE NEGATIVE Final    Comment: (NOTE) SARS-CoV-2 target nucleic acids are NOT DETECTED. The SARS-CoV-2 RNA is generally detectable in upper and  lower respiratory specimens during the acute phase of infection. Negative results do not preclude SARS-CoV-2 infection, do not rule out co-infections with other pathogens, and should not be used as the sole basis for treatment or other patient management decisions. Negative results must be combined with clinical observations, patient history, and epidemiological information. The expected result is Negative. Fact Sheet for Patients: SugarRoll.be Fact Sheet for Healthcare Providers: https://www.woods-mathews.com/ This test is not yet approved or cleared by the [place] FDA and  has been authorized for detection and/or diagnosis of SARS-CoV-2 by FDA under an Emergency Use Authorization (EUA). This EUA will remain  in effect (meaning this test can be used) for the  duration of the COVID-19 declaration under Section 56 4(b)(1) of the Act, 21 U.S.C. section 360bbb-3(b)(1), unless the authorization is terminated or revoked sooner. Performed at [facility], [place] [address] [place], [place] [address]     Coagulation Studies: No results for input(s): LABPROT, INR in the last 72 hours.  Urinalysis: No results for input(s): COLORURINE, LABSPEC, PHURINE, GLUCOSEU, HGBUR, BILIRUBINUR, KETONESUR, PROTEINUR, UROBILINOGEN, NITRITE, LEUKOCYTESUR in the last 72 hours.  Invalid input(s): APPERANCEUR    Imaging: Dg Cystogram  Result Date: 05/22/2019 CLINICAL DATA:  History of bladder cancer. Evaluate for colovesical fistula EXAM: CYSTOGRAM TECHNIQUE: After catheterization of the urinary bladder following sterile technique the bladder was filled with 100 mL mL Cysto-Conray by drip infusion. Serial spot images were obtained during bladder filling. FLUOROSCOPY TIME:  Fluoroscopy Time:  2.0 minutes Radiation Exposure Index (if provided by the fluoroscopic device): 58.5 mGy Number of Acquired Spot Images: 5 full exposures COMPARISON:  CT 05/11/2019 FINDINGS: Examination was limited secondary to patient condition/discomfort. Patient presented to the Radiology Department with Foley catheter in place. Cysto-Conray iodinated contrast was attached to existing catheter and instilled via drip gravity. Only a very small amount of contrast surrounded the Foley catheter balloon within the bladder lumen. The luminal walls appear irregular and did not further distend despite continued installation of contrast. Contrast refluxed into the bilateral ureters which for dilated. Contrast extended into dilated bilateral renal pelvises. There was no obvious extraluminal contrast or evidence of colovesical fistula on the obtained images. Voiding images were unable to be obtained. IMPRESSION: 1. Limited exam.  No obvious colovesical fistula. 2. Very small volume urinary bladder lumen with  irregularity of the luminal walls. Findings may reflect fibrosis/scarring. 3. Contrast refluxed into dilated bilateral ureters and kidneys. Electronically Signed   By: Davina Poke M.D.   On: 05/22/2019 12:57     Medications:   . sodium chloride 250 mL (05/23/19 0757)  . ampicillin-sulbactam (UNASYN) IV 3 g (05/23/19 0554)   . calcium acetate  1,334 mg Oral TID WC  . chlorhexidine  15 mL Mouth/Throat Q4H  . Chlorhexidine Gluconate Cloth  6 each Topical Daily  . epoetin (EPOGEN/PROCRIT) injection  4,000 Units Intravenous Q M,W,F-HD  . feeding supplement (NEPRO CARB STEADY)  237 mL Oral BID BM  . heparin  5,000 Units Subcutaneous Q8H  . influenza vaccine adjuvanted  0.5 mL Intramuscular Tomorrow-1000  . losartan  100 mg Oral Daily  . multivitamin  1 tablet Oral QHS  . mupirocin ointment  1 application Nasal BID  . pantoprazole  40 mg Oral BID  . sodium chloride flush  3 mL Intravenous Q12H  . vitamin C  500 mg Oral BID   sodium chloride, acetaminophen **OR** acetaminophen, albuterol, iohexol  Assessment/ Plan:  78 y.o. African-American female  with end stage renal  disease on hemodialysis, hypertension, diabetes mellitus type II, diabetic neuropathy who is admitted to [facility] on 05/11/2019 for [facility] MWF-1//TW84kg//[facility]//left arm AV graft  1.  End-stage renal disease -Patient completed dialysis yesterday.  Tolerating well.  Next dialysis treatment tomorrow if still here.  2.  Sepsis with altered mental status Blood cultures from 10/22 are positive for Bacteroides.  Urine culture positive for E. coli greater than 100,000 colonies CT negative for stroke Neurology team following Confusion still persist.  She is being effectively treated with antibiotics with Unasyn.  3.  Anemia of chronic kidney disease Lab Results  Component Value Date   HGB 9.0 (L) 05/22/2019  Maintain Epogen with dialysis treatments.  4.  Secondary hyperparathyroidism Lab  Results  Component Value Date   CALCIUM 8.9 05/22/2019   PHOS 8.3 (H) 05/22/2019  Phosphorus was high yesterday at 8.3.  Maintain the patient on calcium acetate and follow-up bone mineral metabolism parameters tomorrow..     LOS: 12 Barbara [name] 11/3/20202:13 PM  [place], [place]  Note: This note was prepared with Dragon dictation. Any transcription errors are unintentional

## 2019-05-24 LAB — PHOSPHORUS: Phosphorus: 5.9 mg/dL — ABNORMAL HIGH (ref 2.5–4.6)

## 2019-05-24 MED ORDER — EPOETIN ALFA 10000 UNIT/ML IJ SOLN: 4000.0000 [IU] | INTRAMUSCULAR | Status: DC

## 2019-05-24 MED ORDER — EPOETIN ALFA 4000 UNIT/ML IJ SOLN
4000.0000 [IU] | INTRAMUSCULAR | Status: DC
  Administered 2019-05-24 – 2019-05-26 (×2): 4000 [IU] via SUBCUTANEOUS
  Filled 2019-05-24: qty 1

## 2019-05-24 NOTE — Progress Notes (Signed)
Pre HD Tx Assessment   05/24/19 1030  Neurological  Level of Consciousness Alert  Orientation Level Oriented to person;Oriented to place  Respiratory  Respiratory Pattern Regular;Unlabored  Chest Assessment Chest expansion symmetrical  Bilateral Breath Sounds Diminished  Cardiac  Pulse Regular  Heart Sounds S1, S2  Jugular Venous Distention (JVD) No  ECG Monitor Yes  Cardiac Rhythm NSR  Antiarrhythmic device No  Vascular  R Radial Pulse +2  L Radial Pulse +2  R Dorsalis Pedis Pulse +1  L Dorsalis Pedis Pulse +1  Edema Right lower extremity;Left lower extremity  Generalized Edema +1  RUE Edema Non-pitting  RLE Edema Non-pitting  LLE Edema Non-Pitting  Integumentary  Integumentary (WDL) X  Skin Color Appropriate for ethnicity  Skin Condition Dry  Skin Integrity Intact;MASD  Musculoskeletal  Musculoskeletal (WDL) X  Generalized Weakness Yes  Gastrointestinal  Bowel Sounds Assessment Active  GU Assessment  Genitourinary (WDL) X  Genitourinary Symptoms Oliguria  Psychosocial  Psychosocial (WDL) X  Patient Behaviors Agitated  Needs Expressed Emotional  Emotional support given Given to patient

## 2019-05-24 NOTE — Plan of Care (Signed)
  Problem: Clinical Measurements: Goal: Ability to maintain clinical measurements within normal limits will improve Outcome: Progressing Goal: Will remain free from infection Outcome: Progressing Goal: Diagnostic test results will improve Outcome: Progressing Goal: Respiratory complications will improve Outcome: Progressing Goal: Cardiovascular complication will be avoided Outcome: Progressing  Pt received HD Tx as PO. Pt run for 3.5 hrs on 3K2.5Ca Pt tolerated the tx well. The BP dropped once without symptoms UF goal was turned off then pt did well for the remaining of Dialysis. Pt was a little agitated. Will keep monitoring

## 2019-05-24 NOTE — Progress Notes (Signed)
Post HD Tx. Assessment   05/24/19 1415  Neurological  Level of Consciousness Alert  Orientation Level Oriented to person;Oriented to place  Respiratory  Respiratory Pattern Regular;Unlabored  Chest Assessment Chest expansion symmetrical  Bilateral Breath Sounds Clear;Diminished  Cardiac  Pulse Regular  Heart Sounds S1, S2  Jugular Venous Distention (JVD) No  ECG Monitor Yes  Cardiac Rhythm NSR  Antiarrhythmic device No  Vascular  R Radial Pulse +2  L Radial Pulse +2  R Dorsalis Pedis Pulse +1  L Dorsalis Pedis Pulse +1  Edema Right lower extremity;Left lower extremity  Generalized Edema +1  RUE Edema Non-pitting  RLE Edema Non-pitting  LLE Edema Non-Pitting  Integumentary  Integumentary (WDL) X  Skin Color Appropriate for ethnicity  Skin Condition Dry  Skin Integrity Intact;MASD  Musculoskeletal  Musculoskeletal (WDL) X  Generalized Weakness Yes  Gastrointestinal  Bowel Sounds Assessment Active  GU Assessment  Genitourinary (WDL) X  Genitourinary Symptoms Oliguria  Psychosocial  Psychosocial (WDL) X  Patient Behaviors Agitated  Needs Expressed Emotional  Emotional support given Given to patient

## 2019-05-24 NOTE — Progress Notes (Signed)
Pre HD Tx Note    05/24/19 1030  Hand-Off documentation  Report given to (Full Name) Newt Minion RN   Report received from (Full Name) Syble Creek RN   Vital Signs  Temp 97.6 F (36.4 C)  Temp Source Oral  Pulse Rate 80  Pulse Rate Source Monitor  Resp (!) 21  BP (!) 110/42  BP Location Right Arm  BP Method Automatic  Patient Position (if appropriate) Lying  Oxygen Therapy  SpO2 93 %  O2 Device Room Air  Pain Assessment  Pain Scale 0-10  Pain Score 0  PAINAD (Pain Assessment in Advanced Dementia)  Breathing 0  Time-Out for Hemodialysis  What Procedure? HD  Pt Identifiers(min of two) First/Last Name;MRN/Account#  Correct Site? Yes  Correct Side? Yes  Correct Procedure? Yes  Consents Verified? Yes  Safety Precautions Reviewed? Yes  Engineer, civil (consulting) Number 7  Station Number 1  UF/Alarm Test Passed  Conductivity: Meter 13.8  Conductivity: Machine  13.9  pH 7.4  Reverse Osmosis  (Main)  Normal Saline Lot Number [id number]  Dialyzer Lot Number [id number]  Disposable Set Lot Number [id number]  Dialysate Acid Bath Lot Number [id number]  Dialysate HCO3 Bath Lot Number [id number]  Machine Temperature 98.6 F (37 C)  Musician and Audible Yes  Blood Lines Intact and Secured Yes  Pre Treatment Patient Checks  Vascular access used during treatment Graft  HD catheter dressing before treatment WDL  Patient is receiving dialysis in a chair  (in bed)  Hepatitis B Surface Antigen Results Negative  Date Hepatitis B Surface Antigen Drawn 05/12/19  Hepatitis B Surface Antibody  (<10)  Date Hepatitis B Surface Antibody Drawn 05/12/19  Hemodialysis Consent Verified Yes  Hemodialysis Standing Orders Initiated Yes  ECG (Telemetry) Monitor On Yes  Prime Ordered Normal Saline  Length of  DialysisTreatment -hour(s) 3.5 Hour(s)  Dialysis Treatment Comments  (Na140)  Dialyzer Elisio 17H NR  Dialysate 3K;2.5 Ca  Dialysis Anticoagulant None  Dialysate Flow Ordered 800  Blood  Flow Rate Ordered 400 mL/min  Ultrafiltration Goal 1.5 Liters  Pre Treatment Labs Renal panel;CBC  Dialysis Blood Pressure Support Ordered Albumin  Education / Care Plan  Dialysis Education Provided Yes  Documented Education in Care Plan Yes  Fistula / Graft Left Upper arm Arteriovenous vein graft  Placement Date/Time: 02/17/17 1111   Orientation: Left  Access Location: Upper arm  Access Type: Arteriovenous vein graft  Site Condition No complications  Fistula / Graft Assessment Present;Thrill  Status Accessed  Needle Size 15  Drainage Description None

## 2019-05-24 NOTE — Progress Notes (Addendum)
Speech Language Pathology Treatment: Dysphagia  Patient Details Name: Barbara [name] MRN: [id number] DOB: 1941-07-09 Today's Date: 05/24/2019 Time: 0825-0900 SLP Time Calculation (min) (ACUTE ONLY): 35 min  Assessment / Plan / Recommendation Clinical Impression  Pt seen today for ongoing toleration of diet and monitoring of safety w/ oral intake in light of her Cognitive/Mental status decline currently. This morning, she appeared quiet and was receptive to few po trials w/ SLP; she had been eating some of her breakfast meal(requires full assistance w/ feeding). Moderate+ confusion noted in verbal engagement but she did indicate her need to go to the bathroom at end of session. She required Mod+ verbal/tactile cues for follow through w/ tasks -- pt was not eager to eat much more of mea, just liquids. No overt decline in Pulmonary status in recent 1-3 days per MD notes. Pt continues w/ HD. During this session, pt consumed sips of Juice and purees w/ adequate pharyngeal phase swallowingof the thin liquids via straw w/ No overt s/s of aspiration noted; no coughing or decline in vocal quality or respiratory status during/post trials occurred. During the oral phase,she orally manipulated/mashing the puree trials presented but often talked over the bolus material in her mouth taking min extra time to fully clear oral cavity. This seemed to be directly impacted by her declined Cognitive decline and agitation, overall decreased attention to the task of oral intake. She has exhibited increased oral phase time w/ trials of increased textured foods d/t the few Dentition she has. (Pt's partial, Denture plate is at home per Husband). No oral phase deficits were noted w/ the trials of thin liquids.  Pt appears to present w/ continued Cognitive/Mental status decline which can increase risk for aspiration, choking. Recommendcontinue amore Pureed diet w/ thin liquids viacup/straw w/ Supervision; aspiration precautions;  Pills in Puree for safer swallowing; support at all meals - tray setup, positioning upright.This would be an advantageous diet consistency for pt at Discharge w/ such Cognitive decline and also missing Dentition.RecommendST f/uat next week for ongoing assessment of status and to see if diet consistency can be upgraded to least restrictive consistency, safely. At discharge (post acute illness), pt would benefit from formal Cognitive-linguistic assessment d/tpt'sdecline current Cognitive decline and such change in Neuro status per Husband's report -- f/u w/ Neurology for formal Cognitive assessment/dx would be recommended.NSG updated and will reconsult ST services if any decline in status while still admitted.    HPI HPI: Pt is a 78 y.o. female with a known history of multiple medical issues including bladder Ca, end-stage renal disease, HD, chronic kidney disease stage IV, COPD, coronary artery disease, diabetes type 2, GERD, celiac dis., sleep apnea, on O2 at home who was seen in the emergency room with complaint of abdominal pain and shortness of breath.  She was previously fairly independent w/ ADLs at home going to HD txs weekly.  During this admission, pt has exhibited increased confusion, nonverbal, and outbursts per staff reports. She has been NPO.  Noted CXR: vascular congestion, no other acute issuse; MRI: Generalized parenchymal atrophy with chronic small vessel ishemic disease. Cognitive status remains declined, and pt requires assistance w/ ALL ADLs.       SLP Plan  All goals met(Pt to f/u at SNF w/ any further needs in upgrading diet/food)       Recommendations  Diet recommendations: Dysphagia 1 (puree);Thin liquid(d/t Cognitive decline) Liquids provided via: Cup;Straw Medication Administration: Crushed with puree(as needed for safer swallowing) Supervision: Staff to assist with self  feeding;Full supervision/cueing for compensatory strategies Compensations: Minimize environmental  distractions;Slow rate;Small sips/bites;Lingual sweep for clearance of pocketing;Multiple dry swallows after each bite/sip;Follow solids with liquid Postural Changes and/or Swallow Maneuvers: Seated upright 90 degrees;Upright 30-60 min after meal                General recommendations: (Dietician f/u) Oral Care Recommendations: Oral care BID;Oral care before and after PO;Staff/trained caregiver to provide oral care Follow up Recommendations: Skilled Nursing facility SLP Visit Diagnosis: Dysphagia, oropharyngeal phase (R13.12)(Cognitive decline) Plan: All goals met(Pt to f/u at [facility] w/ any further needs in upgrading diet/food)       [place], [place], CCC-SLP Cooper Moroney 05/24/2019, 11:03 AM

## 2019-05-24 NOTE — Progress Notes (Signed)
PT Cancellation Note  Patient Details Name: AN LANNAN MRN: [id number] DOB: 11/29/1940   Cancelled Treatment:    Reason Eval/Treat Not Completed: Patient at procedure or test/unavailable(HD)  Lieutenant Diego PT, DPT 11:38 AM,05/24/19 [phone]

## 2019-05-24 NOTE — Progress Notes (Signed)
HD Tx Completed    05/24/19 1419  Vital Signs  Pulse Rate 97  Resp 16  BP (!) 96/54  Oxygen Therapy  SpO2 100 %  O2 Device Room Air  Pain Assessment  Pain Scale 0-10  Pain Score 0  During Hemodialysis Assessment  Blood Flow Rate (mL/min) 350 mL/min  Arterial Pressure (mmHg) -230 mmHg  Venous Pressure (mmHg) 180 mmHg  Transmembrane Pressure (mmHg) 70 mmHg  Ultrafiltration Rate (mL/min) 570 mL/min  Dialysate Flow Rate (mL/min) 800 ml/min  Conductivity: Machine  14  HD Safety Checks Performed Yes  Intra-Hemodialysis Comments Tx completed

## 2019-05-24 NOTE — Progress Notes (Signed)
Post HD Tx Note  Pt tolerated well the HD Tx. UF goal prescribed of 1.5L was almost met actual IF 1450.0L Pt Tx run for 3.5Hrs , received 4000U of Epo as PO. AVG WDL post Tx.    05/24/19 1430  Hand-Off documentation  Report given to (Full Name) Syble Creek RN   Report received from (Full Name) Newt Minion RN   Vital Signs  Temp 99 F (37.2 C)  Temp Source Axillary  Pulse Rate (!) 110  Pulse Rate Source Monitor  Resp 20  BP (!) 95/56  BP Location Right Arm  BP Method Automatic  Patient Position (if appropriate) Lying  Oxygen Therapy  SpO2 100 %  O2 Device Room Air  Pain Assessment  Pain Scale 0-10  Pain Score 0  Post-Hemodialysis Assessment  Rinseback Volume (mL) 250 mL  KECN 74.6 V  Dialyzer Clearance Heavily streaked  Duration of HD Treatment -hour(s) 3.5 hour(s)  Hemodialysis Intake (mL) 500 mL  UF Total -Machine (mL) 1950 mL  Net UF (mL) 1450 mL  Tolerated HD Treatment Yes  AVG/AVF Arterial Site Held (minutes) 8 minutes  AVG/AVF Venous Site Held (minutes) 10 minutes  Fistula / Graft Left Upper arm Arteriovenous vein graft  Placement Date/Time: 02/17/17 1111   Orientation: Left  Access Location: Upper arm  Access Type: Arteriovenous vein graft  Site Condition No complications  Fistula / Graft Assessment Present;Thrill  Status Deaccessed  Drainage Description None   report was given to primary RN

## 2019-05-24 NOTE — TOC Progression Note (Signed)
Transition of Care [facility]) - Progression Note    Patient Details  Name: Barbara [name] MRN: [id number] Date of Birth: 08-01-1940  Transition of Care [facility] CM/SW Contact  [name], RN Phone Number: 05/24/2019, 3:00 PM  Clinical Narrative:    RNCM received insurance with from [facility] Approved from 11/4-11/620 auth number [id number]   Reference number [id number]  Tracie at [facility] notified MD notified RNCM confirmed with [name] HD liaison that HD centers have not yet been switched   Expected Discharge Plan: [place]    Expected Discharge Plan and Services Expected Discharge Plan: [place] arrangements for the past 2 months: Single Family Home                                       Social Determinants of Health (SDOH) Interventions    Readmission Risk Interventions No flowsheet data found.

## 2019-05-24 NOTE — Progress Notes (Signed)
HD Tx Start    05/24/19 1035  Vital Signs  Pulse Rate 80  Resp (!) 22  BP (!) 110/42  BP Location Right Arm  BP Method Automatic  Patient Position (if appropriate) Lying  Oxygen Therapy  SpO2 95 %  O2 Device Room Air  Pain Assessment  Pain Scale 0-10  Pain Score 0  During Hemodialysis Assessment  Blood Flow Rate (mL/min) 400 mL/min  Arterial Pressure (mmHg) -200 mmHg  Venous Pressure (mmHg) 150 mmHg  Transmembrane Pressure (mmHg) 60 mmHg  Ultrafiltration Rate (mL/min) 570 mL/min  Dialysate Flow Rate (mL/min) 800 ml/min  Conductivity: Machine  13.8  HD Safety Checks Performed Yes  Dialysis Fluid Bolus Normal Saline  Bolus Amount (mL) 250 mL  Intra-Hemodialysis Comments Tx initiated

## 2019-05-24 NOTE — Progress Notes (Signed)
PROGRESS NOTE  Barbara [name]  DOB: Apr 19, 1941  PCP: [name], MD FXT:[id number]  DOA: 05/11/2019  LOS: 13 days   Brief narrative: Barbara [name] is a 78 y.o. 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 Patient presented to the ED on 05/11/2019 with weakness, altered mental status, shortness of breath and abdominal pain. In the ED, patient had a temperature 102.7, WBC count 13.6. CT abdomen and pelvis revealed bibasilar airspace opacities likely atelectasis, severe bilateral hydroureteronephrosis, bladder wall markedly thickened but stable since prior study.  Patient was admitted under hospitalist service. Patient was started for treatment for community onset pneumonia with IV ceftriaxone and azithromycin.  Blood cultures obtained on admission grew Bacteroides fragilis.   ID consult obtained.  Antibiotic adjusted to IV Unasyn.    Subjective: Patient was seen and examined this morning.  Elderly African-American female, lying down in bed.  Alert, awake, not interested in communication or examination.  Seems to be irritated easily.  Voiced out some words for me, but would not answer questions appropriately.  Assessment/Plan: Bacteroides fragilis bacteremia -Typical GI organism, unclear how patient became bacteremic with this. -Patient also has severe bladder wall thickening due to radiation cystitis.  Colovesical fistula was suspected but cystogram done on 11/2 is negative for that. -ID consult appreciated.  Currently on IV Unasyn planned till 11/6.  E. coli UTI -Pansensitive E. coli.  Currently improving on IV Unasyn.  Acute encephalopathy -Per patient's husband, patient was in her usual state of mind before she had sudden  change in mental status on the day of admission. -Initially suspected to be secondary to UTI and bacteremia.  Mental status however continues to remain poor. -Neurology consult obtained.  EEG did not show any epileptiform activity. -CT head 10/23 showed stable age-related cerebral atrophy with ventriculomegaly and periventricular white matter disease.   -MRI of the brain 10/25 did not reveal any acute changes.  ESRD on HD Hypertension -Nephrology following. -Lasix and losartan on hold because of blood pressure being on lower side.  Obstructive sleep apnea -Continue CPAP.  History of invasive bladder cancer 2016 -s/p TURBT, chemo, radiation  -complicated by marked bladder wall thickening and bilateral hydronephroureterosis  GERD -Protonix  Physical deconditioning, impaired mobility - Physical therapy recommended rehab.  Pending insurance authorization.  Mobility: PT eval appreciated DVT prophylaxis:  SCDs Code Status:   Code Status: Full Code  Family Communication:  Expected Discharge:  Anticipate discharge to SNF in 1 to 2 days.  Consultants:  ID  Procedures:  none  Antimicrobials: Anti-infectives (From admission, onward)   Start     Dose/Rate Route Frequency Ordered Stop   05/15/19 1800  Ampicillin-Sulbactam (UNASYN) 3 g in sodium chloride 0.9 % 100 mL IVPB     3 g 200 mL/hr over 30 Minutes Intravenous Every 12 hours 05/15/19 1343     05/15/19 1200  vancomycin (VANCOCIN) IVPB 1000 mg/200 mL premix  Status:  Discontinued     1,000 mg 200 mL/hr over 60 Minutes Intravenous Every M-W-F (Hemodialysis) 05/12/19 1420 05/12/19 1917   05/14/19 2330  metroNIDAZOLE (FLAGYL) IVPB 500 mg  Status:  Discontinued     500 mg 100 mL/hr over 60 Minutes Intravenous Every 8 hours 05/14/19 2318 05/15/19 1342   05/12/19 1800  ceFEPIme (MAXIPIME)  1 g in sodium chloride 0.9 % 100 mL IVPB  Status:  Discontinued     1 g 200 mL/hr over 30 Minutes Intravenous Every 24 hours 05/12/19 1420  05/15/19 1342   05/12/19 1600  vancomycin (VANCOCIN) IVPB 1000 mg/200 mL premix     1,000 mg 200 mL/hr over 60 Minutes Intravenous  Once 05/12/19 1420 05/12/19 1656   05/11/19 1530  cefTRIAXone (ROCEPHIN) 2 g in sodium chloride 0.9 % 100 mL IVPB  Status:  Discontinued     2 g 200 mL/hr over 30 Minutes Intravenous Every 24 hours 05/11/19 1520 05/12/19 0759   05/11/19 1530  azithromycin (ZITHROMAX) 500 mg in sodium chloride 0.9 % 250 mL IVPB  Status:  Discontinued     500 mg 250 mL/hr over 60 Minutes Intravenous Every 24 hours 05/11/19 1520 05/12/19 0759   05/11/19 1300  vancomycin (VANCOCIN) 500 mg in sodium chloride 0.9 % 100 mL IVPB     500 mg 100 mL/hr over 60 Minutes Intravenous  Once 05/11/19 1116 05/11/19 1539   05/11/19 1100  vancomycin (VANCOCIN) IVPB 1000 mg/200 mL premix     1,000 mg 200 mL/hr over 60 Minutes Intravenous  Once 05/11/19 1054 05/11/19 1534   05/11/19 1100  ceFEPIme (MAXIPIME) 2 g in sodium chloride 0.9 % 100 mL IVPB     2 g 200 mL/hr over 30 Minutes Intravenous  Once 05/11/19 1054 05/11/19 1219      Diet Order            DIET - DYS 1 Room service appropriate? Yes with Assist; Fluid consistency: Thin  Diet effective now              Infusions:  . sodium chloride 10 mL/hr at 05/24/19 0549  . ampicillin-sulbactam (UNASYN) IV Stopped (05/24/19 0547)    Scheduled Meds: . calcium acetate  1,334 mg Oral TID WC  . chlorhexidine  15 mL Mouth/Throat Q4H  . Chlorhexidine Gluconate Cloth  6 each Topical Daily  . [START ON 05/26/2019] epoetin (EPOGEN/PROCRIT) injection  4,000 Units Subcutaneous Q M,W,F-HD  . feeding supplement (NEPRO CARB STEADY)  237 mL Oral BID BM  . heparin  5,000 Units Subcutaneous Q8H  . influenza vaccine adjuvanted  0.5 mL Intramuscular Tomorrow-1000  . multivitamin  1 tablet Oral QHS  . mupirocin ointment  1 application Nasal BID  . pantoprazole  40 mg Oral BID  . sodium chloride flush  3 mL Intravenous Q12H  . vitamin C  500 mg Oral  BID    PRN meds: sodium chloride, acetaminophen **OR** acetaminophen, albuterol, iohexol   Objective: Vitals:   05/24/19 1419 05/24/19 1430  BP: (!) 83/46 (!) 95/56  Pulse: 97 (!) 110  Resp: 16 20  Temp:  99 F (37.2 C)  SpO2: 100% 100%    Intake/Output Summary (Last 24 hours) at 05/24/2019 1510 Last data filed at 05/24/2019 1430 Gross per 24 hour  Intake 1223.97 ml  Output 1450 ml  Net -226.03 ml   Filed Weights   05/15/19 1215 05/15/19 1220 05/17/19 0945  Weight: 76.1 kg 76.1 kg 74.3 kg   Weight change:  Body mass index is 24.91 kg/m.   Physical Exam: General exam: Lying down in bed.  Not in physical distress Skin: No rashes, lesions or ulcers. HEENT: Atraumatic, normocephalic, supple neck, no obvious bleeding Lungs: Clear to auscultation bilaterally CVS: Regular rate and rhythm, no murmur GI/Abd soft, nondistended, nontender, bowel sound present CNS: Awake, not oriented to place, person or  time.  Not interested in normal conversation. Psychiatry: Seems irritable Extremities: No pedal edema, no calf tenderness  Data Review: I have personally reviewed the laboratory data and studies available.  Recent Labs  Lab 05/22/19 0515  WBC 8.0  HGB 9.0*  HCT 29.0*  MCV 97.6  PLT 304   Recent Labs  Lab 05/22/19 0515 05/24/19 1055  NA 141  --   K 3.9  --   CL 98  --   CO2 22  --   GLUCOSE 106*  --   BUN 77*  --   CREATININE 12.76*  --   CALCIUM 8.9  --   PHOS 8.3* 5.9*    Terrilee Croak, MD  [facility] 05/24/2019

## 2019-05-24 NOTE — Progress Notes (Signed)
[facility], [place] 05/24/19  Subjective:  Patient due for hemodialysis today.  Resting comfortably in bed. Did eat breakfast today.   Objective:  Vital signs in last 24 hours:  Temp:  [98 F (36.7 C)-98.2 F (36.8 C)] 98 F (36.7 C) (11/04 0503) Pulse Rate:  [84-88] 84 (11/04 0503) Resp:  [20] 20 (11/04 0503) BP: (109-114)/(46-52) 109/52 (11/04 0503) SpO2:  [95 %-96 %] 96 % (11/04 0503)  Weight change:  Filed Weights   05/15/19 1215 05/15/19 1220 05/17/19 0945  Weight: 76.1 kg 76.1 kg 74.3 kg    Intake/Output:    Intake/Output Summary (Last 24 hours) at 05/24/2019 1002 Last data filed at 05/24/2019 0830 Gross per 24 hour  Intake 1423.97 ml  Output 0 ml  Net 1423.97 ml     Physical Exam: General:  no acute distress  HEENT  moist oral mucous membranes  Pulm/lungs  clear bilateral, normal effort, [place] O2  CVS/Heart  no rub, S1S2  Abdomen:   Soft, nontender  Extremities:  No peripheral edema  Neurologic:  Awake, but still confused intermittently.  Skin:  Warm, dry  Access:  Left arm AV graft       Basic Metabolic Panel:  Recent Labs  Lab 05/22/19 0515  NA 141  K 3.9  CL 98  CO2 22  GLUCOSE 106*  BUN 77*  CREATININE 12.76*  CALCIUM 8.9  PHOS 8.3*     CBC: Recent Labs  Lab 05/22/19 0515  WBC 8.0  HGB 9.0*  HCT 29.0*  MCV 97.6  PLT 304      Lab Results  Component Value Date   HEPBSAG NON REACTIVE 05/12/2019      Microbiology:  Recent Results (from the past 240 hour(s))  CULTURE, BLOOD (ROUTINE X 2) w Reflex to ID Panel     Status: None (Preliminary result)   Collection Time: 05/21/19 11:36 AM   Specimen: BLOOD  Result Value Ref Range Status   Specimen Description BLOOD RAC  Final   Special Requests   Final    BOTTLES DRAWN AEROBIC AND ANAEROBIC Blood Culture results may not be optimal due to an excessive volume of blood received in culture bottles   Culture   Final    NO GROWTH 3 DAYS Performed at  [facility], [address], [place], [place] [address]    Report Status PENDING  Incomplete  CULTURE, BLOOD (ROUTINE X 2) w Reflex to ID Panel     Status: None (Preliminary result)   Collection Time: 05/21/19 11:36 AM   Specimen: BLOOD  Result Value Ref Range Status   Specimen Description BLOOD RT HAND  Final   Special Requests   Final    BOTTLES DRAWN AEROBIC AND ANAEROBIC Blood Culture results may not be optimal due to an inadequate volume of blood received in culture bottles   Culture   Final    NO GROWTH 3 DAYS Performed at [facility], [address] [place], [place] [address]    Report Status PENDING  Incomplete  SARS CORONAVIRUS 2 (TAT 6-24 HRS) Nasopharyngeal Nasopharyngeal Swab     Status: None   Collection Time: 05/22/19 11:32 PM   Specimen: Nasopharyngeal Swab  Result Value Ref Range Status   SARS Coronavirus 2 NEGATIVE NEGATIVE Final    Comment: (NOTE) SARS-CoV-2 target nucleic acids are NOT DETECTED. The SARS-CoV-2 RNA is generally detectable in upper and lower respiratory specimens during the acute phase of infection. Negative results do not preclude  SARS-CoV-2 infection, do not rule out co-infections with other pathogens, and should not be used as the sole basis for treatment or other patient management decisions. Negative results must be combined with clinical observations, patient history, and epidemiological information. The expected result is Negative. Fact Sheet for Patients: SugarRoll.be Fact Sheet for Healthcare Providers: https://www.woods-mathews.com/ This test is not yet approved or cleared by the [place] FDA and  has been authorized for detection and/or diagnosis of SARS-CoV-2 by FDA under an Emergency Use Authorization (EUA). This EUA will remain  in effect (meaning this test can be used) for the duration of the COVID-19 declaration under Section 56 4(b)(1) of the Act, 21  U.S.C. section 360bbb-3(b)(1), unless the authorization is terminated or revoked sooner. Performed at [facility], [place] [address] [place], [place] [address]     Coagulation Studies: No results for input(s): LABPROT, INR in the last 72 hours.  Urinalysis: No results for input(s): COLORURINE, LABSPEC, PHURINE, GLUCOSEU, HGBUR, BILIRUBINUR, KETONESUR, PROTEINUR, UROBILINOGEN, NITRITE, LEUKOCYTESUR in the last 72 hours.  Invalid input(s): APPERANCEUR    Imaging: Dg Cystogram  Result Date: 05/22/2019 CLINICAL DATA:  History of bladder cancer. Evaluate for colovesical fistula EXAM: CYSTOGRAM TECHNIQUE: After catheterization of the urinary bladder following sterile technique the bladder was filled with 100 mL mL Cysto-Conray by drip infusion. Serial spot images were obtained during bladder filling. FLUOROSCOPY TIME:  Fluoroscopy Time:  2.0 minutes Radiation Exposure Index (if provided by the fluoroscopic device): 58.5 mGy Number of Acquired Spot Images: 5 full exposures COMPARISON:  CT 05/11/2019 FINDINGS: Examination was limited secondary to patient condition/discomfort. Patient presented to the Radiology Department with Foley catheter in place. Cysto-Conray iodinated contrast was attached to existing catheter and instilled via drip gravity. Only a very small amount of contrast surrounded the Foley catheter balloon within the bladder lumen. The luminal walls appear irregular and did not further distend despite continued installation of contrast. Contrast refluxed into the bilateral ureters which for dilated. Contrast extended into dilated bilateral renal pelvises. There was no obvious extraluminal contrast or evidence of colovesical fistula on the obtained images. Voiding images were unable to be obtained. IMPRESSION: 1. Limited exam.  No obvious colovesical fistula. 2. Very small volume urinary bladder lumen with irregularity of the luminal walls. Findings may reflect fibrosis/scarring. 3.  Contrast refluxed into dilated bilateral ureters and kidneys. Electronically Signed   By: Davina Poke M.D.   On: 05/22/2019 12:57     Medications:   . sodium chloride 10 mL/hr at 05/24/19 0549  . ampicillin-sulbactam (UNASYN) IV Stopped (05/24/19 0547)   . calcium acetate  1,334 mg Oral TID WC  . chlorhexidine  15 mL Mouth/Throat Q4H  . Chlorhexidine Gluconate Cloth  6 each Topical Daily  . epoetin (EPOGEN/PROCRIT) injection  4,000 Units Intravenous Q M,W,F-HD  . feeding supplement (NEPRO CARB STEADY)  237 mL Oral BID BM  . heparin  5,000 Units Subcutaneous Q8H  . influenza vaccine adjuvanted  0.5 mL Intramuscular Tomorrow-1000  . multivitamin  1 tablet Oral QHS  . mupirocin ointment  1 application Nasal BID  . pantoprazole  40 mg Oral BID  . sodium chloride flush  3 mL Intravenous Q12H  . vitamin C  500 mg Oral BID   sodium chloride, acetaminophen **OR** acetaminophen, albuterol, iohexol  Assessment/ Plan:  78 y.o. African-American female  with end stage renal disease on hemodialysis, hypertension, diabetes mellitus type II, diabetic neuropathy who is admitted to [facility] on 05/11/2019 for Sepsis   [place]   Dialysis MWF-1//TW84kg//[facility]//left arm AV graft  1.  End-stage renal disease -Patient due for hemodialysis today.  Orders have been prepared.  2.  Sepsis with altered mental status Blood cultures from 10/22 are positive for Bacteroides.  Urine culture positive for E. coli greater than 100,000 colonies CT negative for stroke Patient still having intermittent confusion though improved as compared to admission.  Continue antibiotic therapy with Unasyn.  3.  Anemia of chronic kidney disease Lab Results  Component Value Date   HGB 9.0 (L) 05/22/2019  Patient to receive Epogen 4000 units IV with dialysis today.  4.  Secondary hyperparathyroidism Lab Results  Component Value Date   CALCIUM 8.9 05/22/2019   PHOS 8.3 (H) 05/22/2019  Phosphorus quite high at last  check at 8.3.  Maintain the patient on calcium acetate and follow-up serum phosphorus today.     LOS: 13 Barbara [name] 11/4/202010:02 AM  [place], [place]  Note: This note was prepared with Dragon dictation. Any transcription errors are unintentional

## 2019-05-25 NOTE — TOC Progression Note (Signed)
Transition of Care [facility]) - Progression Note    Patient Details  Name: Barbara [name] MRN: [id number] Date of Birth: [birth year]  Transition of [facility]) CM/SW Contact  [name], RN Phone Number: 05/25/2019, 6:07 PM  Clinical Narrative:     Per [name] HD liasion patient has has out patient HD at [facility]  TTS at 11:40.  Can start next Tuesday.  MD and nephrology notified.  MD to order repeat covid  Tracie at [facility] placed updated anticipated discharge tomorrow afternoon Expected Discharge Plan: [place]    Expected Discharge Plan and Services Expected Discharge Plan: [place] arrangements for the past 2 months: Single Family Home                                       Social Determinants of Health (SDOH) Interventions    Readmission Risk Interventions No flowsheet data found.

## 2019-05-25 NOTE — Care Management Important Message (Signed)
Important Message  Patient Details  Name: Barbara [name] MRN: [id number] Date of Birth: 21-Apr-1941   Medicare Important Message Given:  Yes     [name] 05/25/2019, 1:21 PM

## 2019-05-25 NOTE — Progress Notes (Signed)
PROGRESS NOTE  Barbara [name]  DOB: 1941/05/26  PCP: [name], MD JIR:[id number]  DOA: 05/11/2019  LOS: 14 days   Brief narrative: Barbara [name] is a 78 y.o. 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 Patient presented to the ED on 05/11/2019 with weakness, altered mental status, shortness of breath and abdominal pain.  In the ED, patient had a temperature 102.7, WBC count 13.6. CT abdomen and pelvis revealed bibasilar airspace opacities likely atelectasis, severe bilateral hydroureteronephrosis, bladder wall markedly thickened but stable since prior study.  Patient was admitted under hospitalist service. Patient was started for treatment for community onset pneumonia with IV ceftriaxone and azithromycin.  Blood cultures obtained on admission grew Bacteroides fragilis.   ID consult was obtained.  Antibiotic adjusted to IV Unasyn.    Subjective: Patient was seen and examined this morning.  Elderly African-American female, lying down in bed. Patient acknowledges my presence in the room and mutters nonsensible words.  Unable to have a meaningful conversation.  Does not seem to be in physical distress.   Assessment/Plan: Bacteroides fragilis bacteremia -Typical GI organism, unclear how patient became bacteremic with this. -Patient also has severe bladder wall thickening due to radiation cystitis. -Colovesical fistula was suspected but cystogram done on 11/2 is negative for that. -ID consult appreciated.  Currently on IV Unasyn planned till 11/6.  E. coli UTI -Pansensitive E. coli.  Currently improving on IV Unasyn.  Acute encephalopathy -Per patient's husband, patient was in her usual state of mind before she had sudden  change in mental status on the day of admission. -Initially her mental status change was suspected to be secondary to UTI and bacteremia.  Despite antibiotic treatment for the several days, mental status continues to remain poor. -Neurology consult was obtained.  EEG did not show any epileptiform activity. -CT head 10/23 showed stable age-related cerebral atrophy with ventriculomegaly and periventricular white matter disease.   -MRI of the brain 10/25 did not reveal any acute changes. -Mental status continues to remain poor.  ESRD on HD Hypertension -Nephrology following. -Lasix and losartan on hold because of blood pressure being on lower side.  Obstructive sleep apnea -Continue CPAP.  History of invasive bladder cancer 2016 -s/p TURBT, chemo, radiation  -complicated by marked bladder wall thickening and bilateral hydronephroureterosis  GERD -Protonix  Physical deconditioning, impaired mobility - Physical therapy recommended rehab.  Pending insurance authorization.  Mobility: PT eval appreciated DVT prophylaxis:  SCDs Code Status:   Code Status: Full Code  Family Communication:  Expected Discharge:  Anticipate discharge to SNF in 1 to 2 days.  Consultants:  ID  Procedures:  none  Antimicrobials: Anti-infectives (From admission, onward)   Start     Dose/Rate Route Frequency Ordered Stop   05/15/19 1800  Ampicillin-Sulbactam (UNASYN) 3 g in sodium chloride 0.9 % 100 mL IVPB     3 g 200 mL/hr over 30 Minutes Intravenous Every 12 hours 05/15/19 1343     05/15/19 1200  vancomycin (VANCOCIN) IVPB 1000 mg/200 mL premix  Status:  Discontinued     1,000 mg 200 mL/hr over 60 Minutes Intravenous Every M-W-F (Hemodialysis) 05/12/19 1420 05/12/19 1917   05/14/19 2330  metroNIDAZOLE (FLAGYL) IVPB 500 mg  Status:  Discontinued     500 mg 100  mL/hr over 60 Minutes Intravenous Every 8 hours 05/14/19 2318 05/15/19 1342   05/12/19 1800  ceFEPIme (MAXIPIME) 1 g in sodium chloride 0.9  % 100 mL IVPB  Status:  Discontinued     1 g 200 mL/hr over 30 Minutes Intravenous Every 24 hours 05/12/19 1420 05/15/19 1342   05/12/19 1600  vancomycin (VANCOCIN) IVPB 1000 mg/200 mL premix     1,000 mg 200 mL/hr over 60 Minutes Intravenous  Once 05/12/19 1420 05/12/19 1656   05/11/19 1530  cefTRIAXone (ROCEPHIN) 2 g in sodium chloride 0.9 % 100 mL IVPB  Status:  Discontinued     2 g 200 mL/hr over 30 Minutes Intravenous Every 24 hours 05/11/19 1520 05/12/19 0759   05/11/19 1530  azithromycin (ZITHROMAX) 500 mg in sodium chloride 0.9 % 250 mL IVPB  Status:  Discontinued     500 mg 250 mL/hr over 60 Minutes Intravenous Every 24 hours 05/11/19 1520 05/12/19 0759   05/11/19 1300  vancomycin (VANCOCIN) 500 mg in sodium chloride 0.9 % 100 mL IVPB     500 mg 100 mL/hr over 60 Minutes Intravenous  Once 05/11/19 1116 05/11/19 1539   05/11/19 1100  vancomycin (VANCOCIN) IVPB 1000 mg/200 mL premix     1,000 mg 200 mL/hr over 60 Minutes Intravenous  Once 05/11/19 1054 05/11/19 1534   05/11/19 1100  ceFEPIme (MAXIPIME) 2 g in sodium chloride 0.9 % 100 mL IVPB     2 g 200 mL/hr over 30 Minutes Intravenous  Once 05/11/19 1054 05/11/19 1219      Diet Order            DIET - DYS 1 Room service appropriate? Yes with Assist; Fluid consistency: Thin  Diet effective now              Infusions:  . sodium chloride 250 mL (05/24/19 1800)  . ampicillin-sulbactam (UNASYN) IV 3 g (05/25/19 0544)    Scheduled Meds: . calcium acetate  1,334 mg Oral TID WC  . chlorhexidine  15 mL Mouth/Throat Q4H  . Chlorhexidine Gluconate Cloth  6 each Topical Daily  . [START ON 05/26/2019] epoetin (EPOGEN/PROCRIT) injection  4,000 Units Subcutaneous Q M,W,F-HD  . feeding supplement (NEPRO CARB STEADY)  237 mL Oral BID BM  . heparin  5,000 Units Subcutaneous Q8H  . influenza vaccine adjuvanted  0.5 mL Intramuscular Tomorrow-1000  . multivitamin  1 tablet Oral QHS  . mupirocin ointment  1 application Nasal BID  .  pantoprazole  40 mg Oral BID  . sodium chloride flush  3 mL Intravenous Q12H  . vitamin C  500 mg Oral BID    PRN meds: sodium chloride, acetaminophen **OR** acetaminophen, albuterol, iohexol   Objective: Vitals:   05/24/19 2143 05/25/19 0600  BP: (!) 104/53 (!) 90/43  Pulse: 89 87  Resp: 18 16  Temp: 97.7 F (36.5 C) 98.5 F (36.9 C)  SpO2: 97% 98%    Intake/Output Summary (Last 24 hours) at 05/25/2019 0955 Last data filed at 05/25/2019 0500 Gross per 24 hour  Intake 12.98 ml  Output 1451 ml  Net -1438.02 ml   Filed Weights   05/15/19 1215 05/15/19 1220 05/17/19 0945  Weight: 76.1 kg 76.1 kg 74.3 kg   Weight change:  Body mass index is 24.91 kg/m.   Physical Exam: General exam: Lying down in bed.  Not in physical distress Skin: No rashes, lesions or ulcers. HEENT: Atraumatic Lungs: Clear to auscultation bilaterally CVS: Regular rate and rhythm, no murmur  GI/Abd soft, nondistended, nontender, bowel sound present CNS: Awake, not oriented to place, person or time.  Not interested in normal conversation. Psychiatry: Seems irritable Extremities: No pedal edema, no calf tenderness  Data Review: I have personally reviewed the laboratory data and studies available.  Recent Labs  Lab 05/22/19 0515  WBC 8.0  HGB 9.0*  HCT 29.0*  MCV 97.6  PLT 304   Recent Labs  Lab 05/22/19 0515 05/24/19 1055  NA 141  --   K 3.9  --   CL 98  --   CO2 22  --   GLUCOSE 106*  --   BUN 77*  --   CREATININE 12.76*  --   CALCIUM 8.9  --   PHOS 8.3* 5.9*    Terrilee Croak, MD  [facility] 05/25/2019

## 2019-05-25 NOTE — Progress Notes (Signed)
[place], [place] 05/25/19  Subjective:  Patient completed dialysis yesterday. Tolerated well. Awake but still confused at times.   Objective:  Vital signs in last 24 hours:  Temp:  [97.7 F (36.5 C)-99 F (37.2 C)] 98.5 F (36.9 C) (11/05 0600) Pulse Rate:  [81-110] 87 (11/05 0600) Resp:  [13-24] 16 (11/05 0600) BP: (82-107)/(43-56) 90/43 (11/05 0600) SpO2:  [94 %-100 %] 98 % (11/05 0600)  Weight change:  Filed Weights   05/15/19 1215 05/15/19 1220 05/17/19 0945  Weight: 76.1 kg 76.1 kg 74.3 kg    Intake/Output:    Intake/Output Summary (Last 24 hours) at 05/25/2019 1054 Last data filed at 05/25/2019 1012 Gross per 24 hour  Intake 15.98 ml  Output 1451 ml  Net -1435.02 ml     Physical Exam: General:  no acute distress  HEENT  moist oral mucous membranes  Pulm/lungs  clear bilateral, normal effort  CVS/Heart  no rub, S1S2  Abdomen:   Soft, nontender  Extremities:  No peripheral edema  Neurologic:  Awake, but still confused intermittently.  Skin:  Warm, dry  Access:  Left arm AV graft       Basic Metabolic Panel:  Recent Labs  Lab 05/22/19 0515 05/24/19 1055  NA 141  --   K 3.9  --   CL 98  --   CO2 22  --   GLUCOSE 106*  --   BUN 77*  --   CREATININE 12.76*  --   CALCIUM 8.9  --   PHOS 8.3* 5.9*     CBC: Recent Labs  Lab 05/22/19 0515  WBC 8.0  HGB 9.0*  HCT 29.0*  MCV 97.6  PLT 304      Lab Results  Component Value Date   HEPBSAG NON REACTIVE 05/12/2019      Microbiology:  Recent Results (from the past 240 hour(s))  CULTURE, BLOOD (ROUTINE X 2) w Reflex to ID Panel     Status: None (Preliminary result)   Collection Time: 05/21/19 11:36 AM   Specimen: BLOOD  Result Value Ref Range Status   Specimen Description BLOOD RAC  Final   Special Requests   Final    BOTTLES DRAWN AEROBIC AND ANAEROBIC Blood Culture results may not be optimal due to an excessive volume of blood received in culture  bottles   Culture   Final    NO GROWTH 4 DAYS Performed at [facility], [address] [place], [place] [address]    Report Status PENDING  Incomplete  CULTURE, BLOOD (ROUTINE X 2) w Reflex to ID Panel     Status: None (Preliminary result)   Collection Time: 05/21/19 11:36 AM   Specimen: BLOOD  Result Value Ref Range Status   Specimen Description BLOOD RT HAND  Final   Special Requests   Final    BOTTLES DRAWN AEROBIC AND ANAEROBIC Blood Culture results may not be optimal due to an inadequate volume of blood received in culture bottles   Culture   Final    NO GROWTH 4 DAYS Performed at [facility] [address] [name], [place] [address]    Report Status PENDING  Incomplete  SARS CORONAVIRUS 2 (TAT 6-24 HRS) Nasopharyngeal Nasopharyngeal Swab     Status: None   Collection Time: 05/22/19 11:32 PM   Specimen: Nasopharyngeal Swab  Result Value Ref Range Status   SARS Coronavirus 2 NEGATIVE NEGATIVE Final    Comment: (NOTE) SARS-CoV-2 target nucleic acids are NOT DETECTED.  The SARS-CoV-2 RNA is generally detectable in upper and lower respiratory specimens during the acute phase of infection. Negative results do not preclude SARS-CoV-2 infection, do not rule out co-infections with other pathogens, and should not be used as the sole basis for treatment or other patient management decisions. Negative results must be combined with clinical observations, patient history, and epidemiological information. The expected result is Negative. Fact Sheet for Patients: SugarRoll.be Fact Sheet for Healthcare Providers: https://www.woods-mathews.com/ This test is not yet approved or cleared by the [place] FDA and  has been authorized for detection and/or diagnosis of SARS-CoV-2 by FDA under an Emergency Use Authorization (EUA). This EUA will remain  in effect (meaning this test can be used) for the duration of the COVID-19  declaration under Section 56 4(b)(1) of the Act, 21 U.S.C. section 360bbb-3(b)(1), unless the authorization is terminated or revoked sooner. Performed at [facility], [place] [address] [place], [place] [address]     Coagulation Studies: No results for input(s): LABPROT, INR in the last 72 hours.  Urinalysis: No results for input(s): COLORURINE, LABSPEC, PHURINE, GLUCOSEU, HGBUR, BILIRUBINUR, KETONESUR, PROTEINUR, UROBILINOGEN, NITRITE, LEUKOCYTESUR in the last 72 hours.  Invalid input(s): APPERANCEUR    Imaging: No results found.   Medications:   . sodium chloride 250 mL (05/24/19 1800)  . ampicillin-sulbactam (UNASYN) IV 3 g (05/25/19 0544)   . calcium acetate  1,334 mg Oral TID WC  . chlorhexidine  15 mL Mouth/Throat Q4H  . [START ON 05/26/2019] epoetin (EPOGEN/PROCRIT) injection  4,000 Units Subcutaneous Q M,W,F-HD  . feeding supplement (NEPRO CARB STEADY)  237 mL Oral BID BM  . heparin  5,000 Units Subcutaneous Q8H  . influenza vaccine adjuvanted  0.5 mL Intramuscular Tomorrow-1000  . multivitamin  1 tablet Oral QHS  . mupirocin ointment  1 application Nasal BID  . pantoprazole  40 mg Oral BID  . sodium chloride flush  3 mL Intravenous Q12H  . vitamin C  500 mg Oral BID   sodium chloride, acetaminophen **OR** acetaminophen, albuterol, iohexol  Assessment/ Plan:  78 y.o. African-American female  with end stage renal disease on hemodialysis, hypertension, diabetes mellitus type II, diabetic neuropathy who is admitted to [facility] on 05/11/2019 for [facility] MWF-1//TW84kg//[facility]//left arm AV graft  1.  End-stage renal disease -Patient completed dialysis yesterday.  No acute indication for dialysis today.  We will prepare dialysis orders for tomorrow.  2.  Sepsis with altered mental status Blood cultures from 10/22 are positive for Bacteroides.  Urine culture positive for E. coli greater than 100,000 colonies CT negative for stroke Patient  still having intermittent confusion though improved as compared to admission.  Continue antibiotic therapy with Unasyn till 05/26/19.  3.  Anemia of chronic kidney disease Lab Results  Component Value Date   HGB 9.0 (L) 05/22/2019  Maintain the patient on Epogen on dialysis days.  4.  Secondary hyperparathyroidism Lab Results  Component Value Date   CALCIUM 8.9 05/22/2019   PHOS 5.9 (H) 05/24/2019  Phosphorus down to 5.9 now.  Maintain the patient on calcium acetate.    LOS: 14 Hurley Blevins 11/5/202010:54 AM  [facility] [place], [place]  Note: This note was prepared with Dragon dictation. Any transcription errors are unintentional

## 2019-05-25 NOTE — Progress Notes (Signed)
Physical Therapy Treatment Patient Details Name: Barbara [name] MRN: [id number] DOB: [birth year] Today's Date: 05/25/2019    History of Present Illness  78 y.o. female with a known history of end-stage renal disease, chronic kidney disease stage IV, COPD, coronary artery disease, diabetes type 2, GERD with history of sleep apnea who was seen in the emergency room with complaint of abdominal pain and shortness of breath.  Pt with significant change in baseline mental status, that has fluctuated thus far this admission (11/22), imaging has revealed no acute abnormality.    PT Comments    Patient received scooted all the way down in bed. Agrees to get up to side of bed and sit up. Required min assist for supine>< sit. Declines standing or attempting to scoot up in the bed. Difficulty following commands. Patient will benefit from continued skilled PT to improve functional mobility and safety with mobility.     Follow Up Recommendations  SNF;Supervision/Assistance - 24 hour     Equipment Recommendations  None recommended by PT;Other (comment)(TBD)    Recommendations for Other Services       Precautions / Restrictions Precautions Precautions: Fall Restrictions Weight Bearing Restrictions: No    Mobility  Bed Mobility Overal bed mobility: Needs Assistance Bed Mobility: Supine to Sit;Sit to Supine     Supine to sit: Min assist Sit to supine: Min assist      Transfers                 General transfer comment: refused attempt at standing.  Ambulation/Gait             General Gait Details: deferred   Stairs             Wheelchair Mobility    Modified Rankin (Stroke Patients Only)       Balance Overall balance assessment: Needs assistance Sitting-balance support: Feet supported;Single extremity supported Sitting balance-Leahy Scale: Fair                                      Cognition Arousal/Alertness: Awake/alert Behavior During  Therapy: WFL for tasks assessed/performed Overall Cognitive Status: Impaired/Different from baseline Area of Impairment: Awareness;Following commands;Safety/judgement;Problem solving                               General Comments: able to tell me where she is, said day was Friday. Unable to tell me why she is here.      Exercises      General Comments        Pertinent Vitals/Pain Pain Location: patient moaning with mobility but unable to tell me where she hurts. Pain Descriptors / Indicators: Moaning Pain Intervention(s): Monitored during session;Repositioned    Home Living                      Prior Function            PT Goals (current goals can now be found in the care plan section) Acute Rehab PT Goals Patient Stated Goal: unable to state goals, husband hopes she improves enough to go back home instead of rehab PT Goal Formulation: With family Time For Goal Achievement: 06/02/19 Potential to Achieve Goals: Fair Progress towards PT goals: Progressing toward goals    Frequency    Min 2X/week      PT Plan Current  plan remains appropriate    Co-evaluation              AM-PAC PT "6 Clicks" Mobility   Outcome Measure  Help needed turning from your back to your side while in a flat bed without using bedrails?: A Little Help needed moving from lying on your back to sitting on the side of a flat bed without using bedrails?: A Little Help needed moving to and from a bed to a chair (including a wheelchair)?: Total Help needed standing up from a chair using your arms (e.g., wheelchair or bedside chair)?: Total Help needed to walk in hospital room?: Total Help needed climbing 3-5 steps with a railing? : Total 6 Click Score: 10    End of Session   Activity Tolerance: Other (comment)(limited by cognition, motivation) Patient left: in bed;with bed alarm set;with call bell/phone within reach Nurse Communication: Mobility status PT Visit  Diagnosis: Unsteadiness on feet (R26.81);Muscle weakness (generalized) (M62.81);Difficulty in walking, not elsewhere classified (R26.2);Other abnormalities of gait and mobility (R26.89)     Time: 0814-4818 PT Time Calculation (min) (ACUTE ONLY): 9 min  Charges:  $Therapeutic Activity: 8-22 mins                     Jaquin Coy, PT, GCS 05/25/19,3:57 PM

## 2019-05-26 LAB — PHOSPHORUS: Phosphorus: 4.5 mg/dL (ref 2.5–4.6)

## 2019-05-26 LAB — SARS CORONAVIRUS 2 (TAT 6-24 HRS): SARS Coronavirus 2: NEGATIVE

## 2019-05-26 MED ORDER — ASCORBIC ACID 500 MG PO TABS: 500.0000 mg | ORAL_TABLET | Freq: Two times a day (BID) | ORAL | Status: DC

## 2019-05-26 MED ORDER — NEPRO/CARBSTEADY PO LIQD: 237.0000 mL | Freq: Two times a day (BID) | ORAL | 0 refills | Status: DC

## 2019-05-26 MED ORDER — RENA-VITE PO TABS: 1.0000 | ORAL_TABLET | Freq: Every day | ORAL | 0 refills | Status: DC

## 2019-05-26 NOTE — Progress Notes (Signed)
Post HD Tx Assessment    05/26/19 1220  Neurological  Level of Consciousness Alert  Orientation Level Oriented to person  Respiratory  Respiratory Pattern Regular  Chest Assessment Chest expansion symmetrical  Bilateral Breath Sounds Clear;Diminished  Cough None  Cardiac  Pulse Regular  Heart Sounds S1, S2  ECG Monitor Yes  Cardiac Rhythm NSR  Vascular  R Radial Pulse +2  L Radial Pulse +2  Edema Generalized  Psychosocial  Psychosocial (WDL) WDL  Emotional support given Given to patient

## 2019-05-26 NOTE — Progress Notes (Signed)
Post HD, pt tolerated TX well, UF goal not met as interdialytic symptomatic hypotension prompted an order change to no uf during tx. MD aware. Pt system clotted in last 22 min and TX D/C.    05/26/19 1215  Hand-Off documentation  Handoff Given Given to shift RN/LPN  Report given to (Full Name) Hal Hope Received Received from Transfer Unit/facility  Report received from (Full Name) Beatris Ship, RN   Vital Signs  Temp (!) 97.5 F (36.4 C)  Temp Source Axillary  Pulse Rate 90  Pulse Rate Source Monitor  Resp 19  BP (!) 125/55  BP Location Right Arm  BP Method Automatic  Oxygen Therapy  SpO2 99 %  O2 Device Room Air  Post-Hemodialysis Assessment  Rinseback Volume (mL) 250 mL  KECN 50.8 V  Dialyzer Clearance Clotted  Duration of HD Treatment -hour(s) 3 hour(s) (Tx shortened due to clotted system, 22 min lost)  Hemodialysis Intake (mL) 500 mL  UF Total -Machine (mL) 374 mL  Net UF (mL) -126 mL  Tolerated HD Treatment Yes  AVG/AVF Arterial Site Held (minutes) 10 minutes  AVG/AVF Venous Site Held (minutes) 10 minutes  Fistula / Graft Left Upper arm Arteriovenous vein graft  Placement Date/Time: 02/17/17 1111   Orientation: Left  Access Location: Upper arm  Access Type: Arteriovenous vein graft  Site Condition No complications  Fistula / Graft Assessment Present;Thrill;Bruit  Status Deaccessed  Drainage Description None

## 2019-05-26 NOTE — Progress Notes (Signed)
HD Tx started    05/26/19 0938  Vital Signs  Pulse Rate 88  Pulse Rate Source Monitor  Resp (!) 24  BP (!) 105/51  BP Location Right Arm  BP Method Automatic  Patient Position (if appropriate) Lying  Oxygen Therapy  SpO2 100 %  O2 Device Room Air  Pulse Oximetry Type Continuous  During Hemodialysis Assessment  Blood Flow Rate (mL/min) 400 mL/min  Arterial Pressure (mmHg) -220 mmHg  Venous Pressure (mmHg) 180 mmHg  Transmembrane Pressure (mmHg) 60 mmHg  Ultrafiltration Rate (mL/min) 570 mL/min  Dialysate Flow Rate (mL/min) 800 ml/min  Conductivity: Machine  13.8  HD Safety Checks Performed Yes  Dialysis Fluid Bolus Normal Saline  Bolus Amount (mL) 250 mL  Intra-Hemodialysis Comments Tx initiated  Fistula / Graft Left Upper arm Arteriovenous vein graft  Placement Date/Time: 02/17/17 1111   Orientation: Left  Access Location: Upper arm  Access Type: Arteriovenous vein graft  Site Condition No complications  Fistula / Graft Assessment Present;Thrill;Bruit  Status Accessed  Needle Size 15g  Drainage Description None

## 2019-05-26 NOTE — Progress Notes (Signed)
PT Cancellation Note  Patient Details Name: VY BADLEY MRN: [id number] DOB: 1940/09/10   Cancelled Treatment:    Reason Eval/Treat Not Completed: Other (comment)(Pt off the floor at this time for HD. PT will follow up as able.)   Lieutenant Diego PT, DPT 9:47 AM,05/26/19 [phone]

## 2019-05-26 NOTE — Progress Notes (Signed)
[place], [place] 05/26/19  Subjective:  Patient seen at bedside. Due for hemodialysis today. Orders have been prepared.   Objective:  Vital signs in last 24 hours:  Temp:  [98.6 F (37 C)-98.7 F (37.1 C)] 98.7 F (37.1 C) (11/06 0500) Pulse Rate:  [92-93] 92 (11/06 0500) Resp:  [18-20] 20 (11/06 0500) BP: (95-120)/(50-59) 120/59 (11/06 0500) SpO2:  [99 %-100 %] 100 % (11/06 0500)  Weight change:  Filed Weights   05/15/19 1215 05/15/19 1220 05/17/19 0945  Weight: 76.1 kg 76.1 kg 74.3 kg    Intake/Output:    Intake/Output Summary (Last 24 hours) at 05/26/2019 0932 Last data filed at 05/25/2019 1012 Gross per 24 hour  Intake 3 ml  Output -  Net 3 ml     Physical Exam: General:  no acute distress  HEENT  moist oral mucous membranes  Pulm/lungs  clear bilateral, normal effort  CVS/Heart  no rub, S1S2  Abdomen:   Soft, nontender  Extremities:  No peripheral edema  Neurologic:  Awake, conversant, confused  Skin:  Warm, dry  Access:  Left arm AV graft       Basic Metabolic Panel:  Recent Labs  Lab 05/22/19 0515 05/24/19 1055  NA 141  --   K 3.9  --   CL 98  --   CO2 22  --   GLUCOSE 106*  --   BUN 77*  --   CREATININE 12.76*  --   CALCIUM 8.9  --   PHOS 8.3* 5.9*     CBC: Recent Labs  Lab 05/22/19 0515  WBC 8.0  HGB 9.0*  HCT 29.0*  MCV 97.6  PLT 304      Lab Results  Component Value Date   HEPBSAG NON REACTIVE 05/12/2019      Microbiology:  Recent Results (from the past 240 hour(s))  CULTURE, BLOOD (ROUTINE X 2) w Reflex to ID Panel     Status: None (Preliminary result)   Collection Time: 05/21/19 11:36 AM   Specimen: BLOOD  Result Value Ref Range Status   Specimen Description BLOOD RAC  Final   Special Requests   Final    BOTTLES DRAWN AEROBIC AND ANAEROBIC Blood Culture results may not be optimal due to an excessive volume of blood received in culture bottles   Culture   Final    NO GROWTH 4  DAYS Performed at [facility], [address] [place], [place] [address]    Report Status PENDING  Incomplete  CULTURE, BLOOD (ROUTINE X 2) w Reflex to ID Panel     Status: None (Preliminary result)   Collection Time: 05/21/19 11:36 AM   Specimen: BLOOD  Result Value Ref Range Status   Specimen Description BLOOD RT HAND  Final   Special Requests   Final    BOTTLES DRAWN AEROBIC AND ANAEROBIC Blood Culture results may not be optimal due to an inadequate volume of blood received in culture bottles   Culture   Final    NO GROWTH 4 DAYS Performed at [facility], [address] [place], [place] [address]    Report Status PENDING  Incomplete  SARS CORONAVIRUS 2 (TAT 6-24 HRS) Nasopharyngeal Nasopharyngeal Swab     Status: None   Collection Time: 05/22/19 11:32 PM   Specimen: Nasopharyngeal Swab  Result Value Ref Range Status   SARS Coronavirus 2 NEGATIVE NEGATIVE Final    Comment: (NOTE) SARS-CoV-2 target nucleic acids are NOT DETECTED. The SARS-CoV-2 RNA  is generally detectable in upper and lower respiratory specimens during the acute phase of infection. Negative results do not preclude SARS-CoV-2 infection, do not rule out co-infections with other pathogens, and should not be used as the sole basis for treatment or other patient management decisions. Negative results must be combined with clinical observations, patient history, and epidemiological information. The expected result is Negative. Fact Sheet for Patients: SugarRoll.be Fact Sheet for Healthcare Providers: https://www.woods-mathews.com/ This test is not yet approved or cleared by the [place] FDA and  has been authorized for detection and/or diagnosis of SARS-CoV-2 by FDA under an Emergency Use Authorization (EUA). This EUA will remain  in effect (meaning this test can be used) for the duration of the COVID-19 declaration under Section 56 4(b)(1) of the  Act, 21 U.S.C. section 360bbb-3(b)(1), unless the authorization is terminated or revoked sooner. Performed at [facility], [place] [address], [place], [place] [address]   SARS CORONAVIRUS 2 (TAT 6-24 HRS) Nasopharyngeal Nasopharyngeal Swab     Status: None   Collection Time: 05/25/19  6:55 PM   Specimen: Nasopharyngeal Swab  Result Value Ref Range Status   SARS Coronavirus 2 NEGATIVE NEGATIVE Final    Comment: (NOTE) SARS-CoV-2 target nucleic acids are NOT DETECTED. The SARS-CoV-2 RNA is generally detectable in upper and lower respiratory specimens during the acute phase of infection. Negative results do not preclude SARS-CoV-2 infection, do not rule out co-infections with other pathogens, and should not be used as the sole basis for treatment or other patient management decisions. Negative results must be combined with clinical observations, patient history, and epidemiological information. The expected result is Negative. Fact Sheet for Patients: SugarRoll.be Fact Sheet for Healthcare Providers: https://www.woods-mathews.com/ This test is not yet approved or cleared by the [place] FDA and  has been authorized for detection and/or diagnosis of SARS-CoV-2 by FDA under an Emergency Use Authorization (EUA). This EUA will remain  in effect (meaning this test can be used) for the duration of the COVID-19 declaration under Section 56 4(b)(1) of the Act, 21 U.S.C. section 360bbb-3(b)(1), unless the authorization is terminated or revoked sooner. Performed at [facility], [place] [address], [place], [place] [address]     Coagulation Studies: No results for input(s): LABPROT, INR in the last 72 hours.  Urinalysis: No results for input(s): COLORURINE, LABSPEC, PHURINE, GLUCOSEU, HGBUR, BILIRUBINUR, KETONESUR, PROTEINUR, UROBILINOGEN, NITRITE, LEUKOCYTESUR in the last 72 hours.  Invalid input(s): APPERANCEUR    Imaging: No  results found.   Medications:   . sodium chloride 250 mL (05/25/19 2110)  . ampicillin-sulbactam (UNASYN) IV 3 g (05/25/19 2112)   . calcium acetate  1,334 mg Oral TID WC  . chlorhexidine  15 mL Mouth/Throat Q4H  . epoetin (EPOGEN/PROCRIT) injection  4,000 Units Subcutaneous Q M,W,F-HD  . feeding supplement (NEPRO CARB STEADY)  237 mL Oral BID BM  . heparin  5,000 Units Subcutaneous Q8H  . influenza vaccine adjuvanted  0.5 mL Intramuscular Tomorrow-1000  . multivitamin  1 tablet Oral QHS  . mupirocin ointment  1 application Nasal BID  . pantoprazole  40 mg Oral BID  . sodium chloride flush  3 mL Intravenous Q12H  . vitamin C  500 mg Oral BID   sodium chloride, acetaminophen **OR** acetaminophen, albuterol, iohexol  Assessment/ Plan:  78 y.o. African-American female  with end stage renal disease on hemodialysis, hypertension, diabetes mellitus type II, diabetic neuropathy who is admitted to [facility] on 05[facility]11/2019 for [facility] MWF-1//TW84kg//[facility]//left arm AV graft  1.  End-stage renal disease -Patient due for hemodialysis today.  Orders have been prepared.  She will be transferring to a nursing facility in [facility].  Therefore her dialysis center will be switched to [facility].  We plan to dialyze her today and her next treatment will be on Tuesday as an outpatient.  2.  Sepsis with altered mental status Blood cultures from 10/22 are positive for Bacteroides.  Urine culture positive for E. coli greater than 100,000 colonies CT negative for stroke Patient still having intermittent confusion though improved as compared to admission.  Continue antibiotic therapy with Unasyn till 05/26/19.  3.  Anemia of chronic kidney disease Lab Results  Component Value Date   HGB 9.0 (L) 05/22/2019  Patient to receive Epogen 4000 units today.  4.  Secondary hyperparathyroidism Lab Results  Component Value Date   CALCIUM 8.9 05/22/2019   PHOS 5.9 (H) 05/24/2019   Repeat serum phosphorus today and otherwise maintain the patient on current dosage of calcium acetate.    LOS: 15 Guiseppe Flanagan 11/6/20209:32 [place], [address]  Note: This note was prepared with Dragon dictation. Any transcription errors are unintentional

## 2019-05-26 NOTE — Progress Notes (Signed)
Patient discharged via EMS to [facility] [phone]. VSS  Patient left with belongings and discharge papers. Terri Skains spoke with husband Jeneen Rinks to inform him that she was being transported to [facility].

## 2019-05-26 NOTE — Progress Notes (Signed)
Pre HD Assessment    05/26/19 0900  Neurological  Level of Consciousness Alert  Orientation Level Oriented to person  Respiratory  Respiratory Pattern Regular  Chest Assessment Chest expansion symmetrical  Bilateral Breath Sounds Clear;Diminished  Cardiac  Pulse Regular  Heart Sounds S1, S2  ECG Monitor Yes  Cardiac Rhythm NSR  Vascular  R Radial Pulse +2  L Radial Pulse +2  Edema Generalized  Psychosocial  Psychosocial (WDL) WDL  Emotional support given Given to patient

## 2019-05-26 NOTE — TOC Transition Note (Signed)
Transition of Care [facility]) - CM/SW Discharge Note   Patient Details  Name: Barbara [name] MRN: [id number] Date of Birth: August 09, 1940  Transition of Care [facility]) CM/SW Contact:  [name], RN Phone Number: 05/26/2019, 3:40 PM   Clinical Narrative:     Patient to discharge to [facility] today EMS packet on chart Tracie from [place] [facility] notified   Clinical sent in [facility] EMS packet on chart.  Bedside RN notified   Final next level of care: Skilled Nursing Facility Barriers to Discharge: Barriers Resolved   Patient Goals and CMS Choice        Discharge Placement                Patient to be transferred to facility by: EMS Name of family member notified: Husband Patient and family notified of of transfer: 05/26/19  Discharge Plan and Services                                     Social Determinants of Health (SDOH) Interventions     Readmission Risk Interventions No flowsheet data found.

## 2019-05-26 NOTE — Consult Note (Signed)
Pharmacy Antibiotic Note  Barbara [name] is a 78 y.o. female admitted on 05/11/2019 with pneumonia.  Pharmacy has been consulted for cefepime and vancomycin dosing. CT shows new small bilateral pleural effusions and bibasilar opacities that are likely atelectasis. She has ESRD on HD MWF.  Blood culture with bacteroides fragilis in 1 set.  Plan: Will give one more dose of Unasyn 3 g IV prior to discharge and discontinue today as therapy has been completed.  Height: 5' 8"  (172.7 cm) Weight: 185 lb (83.9 kg) IBW/kg (Calculated) : 63.9  Temp (24hrs), Avg:97.8 F (36.6 C), Min:96.5 F (35.8 C), Max:98.7 F (37.1 C)  Recent Labs  Lab 05/22/19 0515  WBC 8.0  CREATININE 12.76*    Estimated Creatinine Clearance: 4.1 mL/min (A) (by C-G formula based on SCr of 12.76 mg/dL (H)).    Antimicrobials this admission: cefepime 10/22 >> 10/25 vancomycin 10/22 >> 10/23 Metronidazole 10/25 >> 10/26 Unasyn 10/26 >>  Microbiology results: 10/22 BCx: 1/2 set bacteroides fragilis 10/22 UCx: E coli (pan-sensitive) 10/22 SARS CoV-2: negative  10/22 MRSA PCR: negative  Thank you for allowing pharmacy to be a part of this patient's care.  Pearla Dubonnet, PharmD 05/26/2019 3:16 PM

## 2019-05-26 NOTE — Progress Notes (Signed)
Pt stated she felt dizzy, MD notified, UF off.    05/26/19 1008  Vital Signs  Pulse Rate 95  Resp 20  BP (!) 96/50 (UF turned off, MD notified )  Oxygen Therapy  SpO2 99 %

## 2019-05-26 NOTE — Discharge Summary (Signed)
Physician Discharge Summary  [name] PFX:[id number] DOB: [birth year] DOA: 05/11/2019  PCP: [name], MD  Admit date: 05/11/2019 Discharge date: 05/26/2019  Admitted From: Home Discharge disposition: Rehab   Code Status: Full Code  Diet Recommendation: Renal diet   Recommendations for Outpatient Follow-Up:   1. Follow-up with nephrology as an outpatient  Discharge Diagnosis:   Active Problems:   ESRD (end stage renal disease) ([place])   Lobar pneumonia ([place])   Somnolence   Bacteremia due to other bacteria   Bacteroides fragilis infection   Acute metabolic encephalopathy    History of Present Illness / Brief narrative:  Barbara [name] is a 78 y.o. 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 Patient presented to the ED on 05/11/2019 with weakness, altered mental status, shortness of breath and abdominal pain.  In the ED, patient had a temperature 102.7, WBC count 13.6. CT abdomen and pelvis revealed bibasilar airspace opacities likely atelectasis, severe bilateral hydroureteronephrosis, bladder wall markedly thickened but stable since prior study.  Patient was admitted under hospitalist service.Patient was started for treatment for community onset pneumonia with IV ceftriaxone and azithromycin. Blood cultures obtained on admission grew Bacteroides fragilis.   ID consult was obtained.  Antibiotic adjusted to IV Unasyn.    Hospital Course:  Bacteroides fragilis bacteremia -Typical GI organism, unclear how patient became bacteremic with this. -Patient also has severe bladder wall thickening due to radiation cystitis.  -Colovesical fistula was suspected but cystogram done on 11/2 is negative for that. -ID  consult appreciated. Currently on IV Unasyn, to complete the course today on 11/6.  E. coli UTI -Pansensitive E. coli. Currently on IV Unasyn, to complete the course today on 11/6.  Acute encephalopathy -Per patient's husband, patient was in her usual state of mind before she had sudden change in mental status on the day of admission. -Initially her mental status change was suspected to be secondary to UTI and bacteremia. Despite antibiotic treatment for the several days, mental status is not yet back to baseline.  He continues to wax and wane. -Neurology consult was obtained.  EEG did not show any epileptiform activity. -CT head 10/23 showed stable age-related cerebral atrophy with ventriculomegaly and periventricular white matter disease.  -MRI of the brain 10/25 did not reveal any acute changes. -Mental status continues to wax and wane.   ESRD on HD / Hypertension -Nephrology consult appreciated.  Underwent dialysis this morning. -Lasix and losartan on hold because of blood pressure being on lower side.  Obstructive sleep apnea -Continue CPAP.  History of invasive bladder cancer 2016 -s/p TURBT, chemo, radiation  -complicated by marked bladder wall thickening and bilateral hydronephroureterosis  GERD -Protonix  Physical deconditioning, impaired mobility - Physical therapy recommended rehab.  Okay to discharge to rehab today.  Per case management, patient will have next dialysis session on Tuesday as an outpatient.  Subjective:  Seen and examined this morning.  Elderly African-American female.  Lying down in bed.  Not in distress.  Able to answer few questions for me today.  But still remains confused.  Discharge Exam:   Vitals:   05/26/19 1008 05/26/19 1015 05/26/19 1030 05/26/19 1045  BP: (!) 96/50 (!) 116/47 (!) 98/43 (!) 102/49  Pulse: 95 92 99 87  Resp: 20 15  16 14  Temp:      TempSrc:      SpO2: 99% 99% 97% 96%  Weight:      Height:        Body mass  index is 28.13 kg/m.  General exam: Appears calm and comfortable.  Not in physical distress Skin: No rashes, lesions or ulcers. HEENT: Atraumatic, normocephalic, supple neck, no obvious bleeding Lungs: Clear to auscultation bilaterally CVS: Regular rate and rhythm, no murmur GI/Abd soft, nontender, nondistended, bowel sound present CNS: Alert, awake, oriented to place.  Waxing and waning mental status Psychiatry: Mood appropriate Extremities: No pedal edema, no calf tenderness  Discharge Instructions:  Wound care: None Discharge Instructions    Increase activity slowly   Complete by: As directed      Contact information for after-discharge care    Destination    [facility] .   Service: Skilled Nursing Contact information: [address] [place] [phone]             Allergies as of 05/26/2019      Reactions   Glipizide Er Other (See Comments)   Hypoglycemia   Percocet [oxycodone-acetaminophen] Hives      Medication List    STOP taking these medications   ibuprofen 200 MG tablet Commonly known as: ADVIL     TAKE these medications   Accu-Chek Aviva Plus test strip Generic drug: glucose blood 1 each by Other route daily. use as directed   glucose blood test strip USE AS DIRECTED ONCE DAILY   Accu-Chek Aviva Plus w/Device Kit (USE TO MONITOR BLOOD SUGARS.)   Accu-Chek Softclix Lancets lancets INJECT 1 EACH AS DIRECTED ONCE DAILY.   acetaminophen 500 MG tablet Commonly known as: TYLENOL Take 1,000 mg by mouth 2 (two) times daily as needed for moderate pain or headache.   albuterol 108 (90 Base) MCG/ACT inhaler Commonly known as: VENTOLIN HFA Inhale 2 puffs into the lungs every 6 (six) hours as needed for wheezing or shortness of breath.   ascorbic acid 500 MG tablet Commonly known as: VITAMIN C Take 1 tablet (500 mg total) by mouth 2 (two) times daily.   atorvastatin 40 MG tablet Commonly  known as: LIPITOR Take 40 mg by mouth every evening.   calcium acetate 667 MG capsule Commonly known as: PHOSLO Take 2 capsules (1,334 mg total) by mouth 3 (three) times daily with meals. What changed:   when to take this  additional instructions   calcium carbonate 500 MG chewable tablet Commonly known as: TUMS - dosed in mg elemental calcium Chew 1 tablet by mouth daily.   cholecalciferol 1000 units tablet Commonly known as: VITAMIN D Take 1,000 Units by mouth daily.   cinacalcet 30 MG tablet Commonly known as: SENSIPAR Take 30 mg by mouth daily. With largest meal   diazepam 2 MG tablet Commonly known as: VALIUM Take 2 mg by mouth every 12 (twelve) hours as needed for anxiety.   esomeprazole 40 MG capsule Commonly known as: NEXIUM Take 40 mg by mouth daily after breakfast.   feeding supplement (NEPRO CARB STEADY) Liqd Take 237 mLs by mouth 2 (two) times daily between meals.   ferrous sulfate 325 (65 FE) MG EC tablet Take 325 mg by mouth daily after breakfast.   furosemide 40 MG tablet Commonly known as: LASIX Take 40 mg by mouth 2 (two) times daily.   gabapentin 300 MG capsule Commonly known as: NEURONTIN Take 600 mg by mouth 3 (  three) times daily.   loperamide 1 MG/5ML solution Commonly known as: IMODIUM Take 2 mg by mouth as needed for diarrhea or loose stools.   losartan 50 MG tablet Commonly known as: COZAAR Take 50 mg by mouth every evening.   mirabegron ER 25 MG Tb24 tablet Commonly known as: MYRBETRIQ Take 25 mg by mouth daily.   multivitamin Tabs tablet Take 1 tablet by mouth at bedtime.   NON FORMULARY Place 1 Units into the nose at bedtime. CPAP Time of use 2100-0600   oxybutynin 5 MG tablet Commonly known as: DITROPAN Take 5 mg by mouth 3 (three) times daily.   OXYGEN Inhale 2 L into the lungs at bedtime.       Time coordinating discharge: 35 minutes  The results of significant diagnostics from this hospitalization (including  imaging, microbiology, ancillary and laboratory) are listed below for reference.    Procedures and Diagnostic Studies:   Ct Abdomen Pelvis Wo Contrast  Result Date: 05/11/2019 CLINICAL DATA:  Abdominal pain, fever.  History of bladder cancer EXAM: CT ABDOMEN AND PELVIS WITHOUT CONTRAST TECHNIQUE: Multidetector CT imaging of the abdomen and pelvis was performed following the standard protocol without IV contrast. COMPARISON:  01/19/2018 FINDINGS: Lower chest: Small bilateral pleural effusions. Cardiomegaly. Bibasilar airspace opacities likely atelectasis. Hepatobiliary: No focal hepatic abnormality. Gallbladder unremarkable. Pancreas: No focal abnormality or ductal dilatation. Spleen: No focal abnormality.  Normal size. Adrenals/Urinary Tract: Severe bilateral hydroureteronephrosis, unchanged. Bladder wall is markedly thickened and stable since prior study. No visible adrenal lesion. Stomach/Bowel: Stomach, large and small bowel grossly unremarkable. Vascular/Lymphatic: Heavily calcified aorta and iliac vessels. Right common iliac lymph node again noted with a short axis diameter of 1.5 cm, stable. Right inguinal lymph node has a short axis diameter of 7 mm, stable. Reproductive: Prior hysterectomy.  No adnexal masses. Other: No free fluid or free air. Musculoskeletal: No acute bony abnormality. IMPRESSION: Continued marked bladder wall thickening and severe bilateral hydronephrosis. Findings similar to prior study. Heavily calcified aorta and iliac vessels. New small bilateral pleural effusions. Bibasilar opacities, likely atelectasis. Stable mildly enlarged and borderline sized right common iliac and right inguinal lymph nodes. Electronically Signed   By: Rolm Baptise M.D.   On: 05/11/2019 13:51   Ct Head Wo Contrast  Result Date: 05/12/2019 CLINICAL DATA:  Altered mental status. EXAM: CT HEAD WITHOUT CONTRAST TECHNIQUE: Contiguous axial images were obtained from the base of the skull through the vertex  without intravenous contrast. COMPARISON:  Head CT 05/11/2017 FINDINGS: Brain: Stable age related cerebral atrophy, ventriculomegaly and periventricular white matter disease. No extra-axial fluid collections are identified. No CT findings for acute hemispheric infarction or intracranial hemorrhage. No mass lesions. The brainstem and cerebellum are normal. Vascular: Stable vascular calcifications. No aneurysm or hyperdense vessels. Skull: No skull fracture or bone lesions. Sinuses/Orbits: The paranasal sinuses and mastoid air cells are clear. The globes are intact. Other: No scalp lesions or hematoma. IMPRESSION: 1. Stable age related cerebral atrophy, ventriculomegaly and periventricular white matter disease. 2. No acute intracranial findings or mass lesions. Electronically Signed   By: Marijo Sanes M.D.   On: 05/12/2019 08:51   Dg Chest Port 1 View  Result Date: 05/11/2019 CLINICAL DATA:  Sepsis EXAM: PORTABLE CHEST 1 VIEW COMPARISON:  05/10/2018 FINDINGS: Cardiac shadow is mildly prominent but accentuated by the portable technique. Further stenting in the left innominate vein and left subclavian vein is noted. Right chest wall port is been removed in the interval. Diffuse vascular congestion is noted  with mild interstitial edema. This may be related to a degree of volume overload related to the end-stage renal failure. No focal confluent infiltrate is seen. No sizable effusion is noted. No bony abnormality is noted. IMPRESSION: Vascular congestion likely related to the known renal failure and volume overload. No other focal abnormality is seen. Electronically Signed   By: Inez Catalina M.D.   On: 05/11/2019 12:11     Labs:   Basic Metabolic Panel: Recent Labs  Lab 05/22/19 0515 05/24/19 1055 05/26/19 0948  NA 141  --   --   K 3.9  --   --   CL 98  --   --   CO2 22  --   --   GLUCOSE 106*  --   --   BUN 77*  --   --   CREATININE 12.76*  --   --   CALCIUM 8.9  --   --   PHOS 8.3* 5.9* 4.5    GFR Estimated Creatinine Clearance: 4.1 mL/min (A) (by C-G formula based on SCr of 12.76 mg/dL (H)). Liver Function Tests: Recent Labs  Lab 05/22/19 0515  ALBUMIN 3.0*   No results for input(s): LIPASE, AMYLASE in the last 168 hours. No results for input(s): AMMONIA in the last 168 hours. Coagulation profile No results for input(s): INR, PROTIME in the last 168 hours.  CBC: Recent Labs  Lab 05/22/19 0515  WBC 8.0  HGB 9.0*  HCT 29.0*  MCV 97.6  PLT 304   Cardiac Enzymes: No results for input(s): CKTOTAL, CKMB, CKMBINDEX, TROPONINI in the last 168 hours. BNP: Invalid input(s): POCBNP CBG: No results for input(s): GLUCAP in the last 168 hours. D-Dimer No results for input(s): DDIMER in the last 72 hours. Hgb A1c No results for input(s): HGBA1C in the last 72 hours. Lipid Profile No results for input(s): CHOL, HDL, LDLCALC, TRIG, CHOLHDL, LDLDIRECT in the last 72 hours. Thyroid function studies No results for input(s): TSH, T4TOTAL, T3FREE, THYROIDAB in the last 72 hours.  Invalid input(s): FREET3 Anemia work up No results for input(s): VITAMINB12, FOLATE, FERRITIN, TIBC, IRON, RETICCTPCT in the last 72 hours. Microbiology Recent Results (from the past 240 hour(s))  CULTURE, BLOOD (ROUTINE X 2) w Reflex to ID Panel     Status: None (Preliminary result)   Collection Time: 05/21/19 11:36 AM   Specimen: BLOOD  Result Value Ref Range Status   Specimen Description BLOOD RAC  Final   Special Requests   Final    BOTTLES DRAWN AEROBIC AND ANAEROBIC Blood Culture results may not be optimal due to an excessive volume of blood received in culture bottles   Culture   Final    NO GROWTH 4 DAYS Performed at [facility], [address], [place], [place] [address]    Report Status PENDING  Incomplete  CULTURE, BLOOD (ROUTINE X 2) w Reflex to ID Panel     Status: None (Preliminary result)   Collection Time: 05/21/19 11:36 AM   Specimen: BLOOD  Result Value Ref Range  Status   Specimen Description BLOOD RT HAND  Final   Special Requests   Final    BOTTLES DRAWN AEROBIC AND ANAEROBIC Blood Culture results may not be optimal due to an inadequate volume of blood received in culture bottles   Culture   Final    NO GROWTH 4 DAYS Performed at [facility], [address], [place], [place] [address]    Report Status PENDING  Incomplete  SARS CORONAVIRUS  2 (TAT 6-24 HRS) Nasopharyngeal Nasopharyngeal Swab     Status: None   Collection Time: 05/22/19 11:32 PM   Specimen: Nasopharyngeal Swab  Result Value Ref Range Status   SARS Coronavirus 2 NEGATIVE NEGATIVE Final    Comment: (NOTE) SARS-CoV-2 target nucleic acids are NOT DETECTED. The SARS-CoV-2 RNA is generally detectable in upper and lower respiratory specimens during the acute phase of infection. Negative results do not preclude SARS-CoV-2 infection, do not rule out co-infections with other pathogens, and should not be used as the sole basis for treatment or other patient management decisions. Negative results must be combined with clinical observations, patient history, and epidemiological information. The expected result is Negative. Fact Sheet for Patients: SugarRoll.be Fact Sheet for Healthcare Providers: https://www.woods-mathews.com/ This test is not yet approved or cleared by the [place] FDA and  has been authorized for detection and/or diagnosis of SARS-CoV-2 by FDA under an Emergency Use Authorization (EUA). This EUA will remain  in effect (meaning this test can be used) for the duration of the COVID-19 declaration under Section 56 4(b)(1) of the Act, 21 U.S.C. section 360bbb-3(b)(1), unless the authorization is terminated or revoked sooner. Performed at [facility], [place] [address], [place], [place] [address]   SARS CORONAVIRUS 2 (TAT 6-24 HRS) Nasopharyngeal Nasopharyngeal Swab     Status: None   Collection Time: 05/25/19   6:55 PM   Specimen: Nasopharyngeal Swab  Result Value Ref Range Status   SARS Coronavirus 2 NEGATIVE NEGATIVE Final    Comment: (NOTE) SARS-CoV-2 target nucleic acids are NOT DETECTED. The SARS-CoV-2 RNA is generally detectable in upper and lower respiratory specimens during the acute phase of infection. Negative results do not preclude SARS-CoV-2 infection, do not rule out co-infections with other pathogens, and should not be used as the sole basis for treatment or other patient management decisions. Negative results must be combined with clinical observations, patient history, and epidemiological information. The expected result is Negative. Fact Sheet for Patients: SugarRoll.be Fact Sheet for Healthcare Providers: https://www.woods-mathews.com/ This test is not yet approved or cleared by the [place] FDA and  has been authorized for detection and/or diagnosis of SARS-CoV-2 by FDA under an Emergency Use Authorization (EUA). This EUA will remain  in effect (meaning this test can be used) for the duration of the COVID-19 declaration under Section 56 4(b)(1) of the Act, 21 U.S.C. section 360bbb-3(b)(1), unless the authorization is terminated or revoked sooner. Performed at [facility], [place] [address] [place], [place] [address]     Please note: You were cared for by a hospitalist during your hospital stay. Once you are discharged, your primary care physician will handle any further medical issues. Please note that NO REFILLS for any discharge medications will be authorized once you are discharged, as it is imperative that you return to your primary care physician (or establish a relationship with a primary care physician if you do not have one) for your post hospital discharge needs so that they can reassess your need for medications and monitor your lab values.  Signed: Terrilee Croak  [facility] 05/26/2019, 12:29 PM

## 2019-05-26 NOTE — Progress Notes (Signed)
Pre HD Tx   05/26/19 0932  Hand-Off documentation  Handoff Given Given to Transfer Unit/facility  Report given to (Full Name) Beatris Ship, RN  Handoff Received Received from shift RN/LPN  Report received from (Full Name) Brandi   Vital Signs  Temp (!) 96.5 F (35.8 C)  Temp Source Axillary  Pulse Rate 88  Pulse Rate Source Monitor  BP (!) 110/59  BP Location Right Arm  BP Method Automatic  Patient Position (if appropriate) Lying  Oxygen Therapy  SpO2 100 %  O2 Device Room Air  Pulse Oximetry Type Continuous  Pain Assessment  Pain Scale 0-10  Pain Score 0  Dialysis Weight  Weight 83.9 kg  Type of Weight Pre-Dialysis  Time-Out for Hemodialysis  What Procedure? HD  Pt Identifiers(min of two) First/Last Name  Correct Site? Yes  Correct Side? Yes  Correct Procedure? Yes  Consents Verified? Yes  Rad Studies Available? Yes  Safety Precautions Reviewed? Yes  Engineer, civil (consulting) Number 7  Station Number 1  UF/Alarm Test Passed  Conductivity: Meter 13.8  Conductivity: Machine  13.9  pH 7.2  Reverse Osmosis Main  Normal Saline Lot Number [id number]  Dialyzer Lot Number [id number]  Disposable Set Lot Number [id number]  Machine Temperature 98.6 F (37 C)  Musician and Audible Yes  Blood Lines Intact and Secured Yes  Pre Treatment Patient Checks  Vascular access used during treatment Fistula  Hepatitis B Surface Antigen Results Negative  Date Hepatitis B Surface Antigen Drawn 05/12/19  Hemodialysis Consent Verified Yes  Hemodialysis Standing Orders Initiated Yes  ECG (Telemetry) Monitor On Yes  Prime Ordered Normal Saline  Length of  DialysisTreatment -hour(s) 3.5 Hour(s)  Dialysis Treatment Comments Na 140  Dialyzer Elisio 17H NR  Dialysate 3K;2.5 Ca  Dialysis Anticoagulant None  Dialysate Flow Ordered 800  Blood Flow Rate Ordered 400 mL/min  Ultrafiltration Goal 1.5 Liters  Pre Treatment Labs Phosphorus  Dialysis Blood Pressure Support Ordered Normal  Saline  Education / Care Plan  Dialysis Education Provided No (Comment)  Documented Education in Care Plan  (Pt confused)  Fistula / Graft Left Upper arm Arteriovenous vein graft  Placement Date/Time: 02/17/17 1111   Orientation: Left  Access Location: Upper arm  Access Type: Arteriovenous vein graft  Site Condition No complications  Fistula / Graft Assessment Present;Thrill;Bruit  Status Patent  Drainage Description None

## 2019-05-26 NOTE — Progress Notes (Signed)
Report called to Jeral Fruit, RN, [employer]  Awaiting EMS transport.

## 2019-05-30 LAB — CULTURE, BLOOD (ROUTINE X 2)
Culture: NO GROWTH
Culture: NO GROWTH

## 2019-07-11 ENCOUNTER — Encounter (INDEPENDENT_AMBULATORY_CARE_PROVIDER_SITE_OTHER): Payer: Self-pay | Admitting: Nurse Practitioner

## 2019-07-11 ENCOUNTER — Other Ambulatory Visit: Payer: Self-pay

## 2019-07-11 ENCOUNTER — Ambulatory Visit (INDEPENDENT_AMBULATORY_CARE_PROVIDER_SITE_OTHER): Payer: Medicare Other

## 2019-07-11 ENCOUNTER — Ambulatory Visit (INDEPENDENT_AMBULATORY_CARE_PROVIDER_SITE_OTHER): Payer: Medicare Other | Admitting: Nurse Practitioner

## 2019-07-11 VITALS — BP 146/62 | HR 75 | Resp 22 | Ht 68.0 in | Wt 172.0 lb

## 2019-07-11 DIAGNOSIS — T829XXS Unspecified complication of cardiac and vascular prosthetic device, implant and graft, sequela: Secondary | ICD-10-CM | POA: Diagnosis not present

## 2019-07-11 DIAGNOSIS — J449 Chronic obstructive pulmonary disease, unspecified: Secondary | ICD-10-CM

## 2019-07-11 DIAGNOSIS — I10 Essential (primary) hypertension: Secondary | ICD-10-CM

## 2019-07-11 DIAGNOSIS — N186 End stage renal disease: Secondary | ICD-10-CM | POA: Diagnosis not present

## 2019-07-16 ENCOUNTER — Encounter (INDEPENDENT_AMBULATORY_CARE_PROVIDER_SITE_OTHER): Payer: Self-pay | Admitting: Nurse Practitioner

## 2019-07-16 NOTE — Progress Notes (Signed)
SUBJECTIVE:  Patient ID: Barbara [name], female    DOB: [birth year], 78 y.o.   MRN: [id number] Chief Complaint  Patient presents with  . Follow-up    HPI  Barbara [name] is a 78 y.o. female The patient returns to the office for followup of their dialysis access. The function of the access has been stable. The patient denies increased bleeding time or increased recirculation. Patient denies difficulty with cannulation. The patient denies hand pain or other symptoms consistent with steal phenomena.  No significant arm swelling.  The patient denies redness or swelling at the access site. The patient denies fever or chills at home or while on dialysis.  The patient denies amaurosis fugax or recent TIA symptoms. There are no recent neurological changes noted. The patient denies claudication symptoms or rest pain symptoms. The patient denies history of DVT, PE or superficial thrombophlebitis. The patient denies recent episodes of angina or shortness of breath.   The patient has a flow volume of 1208.  The left brachial axillary graft is patent and all previously placed stents are also patent.  There is a focal velocity increase at the proximal anastomosis which may be due to a mild change in diameter however the velocity increase is not significant.      Past Medical History:  Diagnosis Date  . Adult celiac disease   . Anemia associated with chronic renal failure 2017   blood transfusion last week 10/17  . Arthritis   . Asthma   . Cancer ([place]) 2017   bladder  . Chronic kidney disease   . 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  dr. Candiss Norse and dr. Holley Raring follow her  . COPD (chronic obstructive pulmonary disease) ([place])   . Coronary artery disease   . Diabetes mellitus without complication ([place])   . Dialysis patient ([place])    Tues, Thurs, Sat  . Dyspnea    with exertion  . Elevated lipids   . GERD (gastroesophageal reflux disease)   . Hematuria   . Hypertension   .  Lower back pain   . Neuropathy   . Oxygen dependent    at hs  . Personal history of chemotherapy 2017   bladder ca  . Personal history of radiation therapy 2017   bladder ca  . Sleep apnea    uses cpap  . Urinary obstruction 01/2016    Past Surgical History:  Procedure Laterality Date  . A/V SHUNT INTERVENTION N/A 05/12/2018   Procedure: A/V SHUNT INTERVENTION;  Surgeon: Algernon Huxley, MD;  Location: [facility];  Service: Cardiovascular;  Laterality: N/A;  . A/V SHUNT INTERVENTION Left 07/29/2018   Procedure: A/V SHUNT INTERVENTION;  Surgeon: Katha Cabal, MD;  Location: [facility];  Service: Cardiovascular;  Laterality: Left;  . ABDOMINAL HYSTERECTOMY    . AV FISTULA PLACEMENT Left 02/17/2017   Procedure: INSERTION OF ARTERIOVENOUS (AV) GORE-TEX GRAFT ARM(BRACHIAL AXILLARY);  Surgeon: Katha Cabal, MD;  Location: [facility];  Service: Vascular;  Laterality: Left;  . CYSTOSCOPY W/ RETROGRADES Bilateral 02/17/2016   Procedure: CYSTOSCOPY WITH RETROGRADE PYELOGRAM;  Surgeon: Hollice Espy, MD;  Location: [facility];  Service: Urology;  Laterality: Bilateral;  . CYSTOSCOPY W/ RETROGRADES Bilateral 10/12/2016   Procedure: CYSTOSCOPY WITH RETROGRADE PYELOGRAM;  Surgeon: Hollice Espy, MD;  Location: [facility];  Service: Urology;  Laterality: Bilateral;  . CYSTOSCOPY W/ RETROGRADES Bilateral 06/09/2017   Procedure: CYSTOSCOPY WITH RETROGRADE PYELOGRAM;  Surgeon: Hollice Espy,  MD;  Location: [facility];  Service: Urology;  Laterality: Bilateral;  . CYSTOSCOPY W/ RETROGRADES Bilateral 10/07/2017   Procedure: CYSTOSCOPY WITH RETROGRADE PYELOGRAM;  Surgeon: Hollice Espy, MD;  Location: [facility];  Service: Urology;  Laterality: Bilateral;  . CYSTOSCOPY W/ URETERAL STENT PLACEMENT Left 05/12/2016   Procedure: CYSTOSCOPY WITH STENT REPLACEMENT;  Surgeon: Hollice Espy, MD;  Location: [facility];  Service: Urology;  Laterality: Left;  . CYSTOSCOPY W/ URETERAL STENT PLACEMENT  Left 10/12/2016   Procedure: CYSTOSCOPY WITH STENT REPLACEMENT;  Surgeon: Hollice Espy, MD;  Location: [facility];  Service: Urology;  Laterality: Left;  . CYSTOSCOPY WITH BIOPSY N/A 06/09/2017   Procedure: CYSTOSCOPY WITH BLADDER BIOPSY;  Surgeon: Hollice Espy, MD;  Location: [facility];  Service: Urology;  Laterality: N/A;  . CYSTOSCOPY WITH STENT PLACEMENT Left 01/21/2016   Procedure: CYSTOSCOPY WITH double J STENT PLACEMENT;  Surgeon: Franchot Gallo, MD;  Location: [facility];  Service: Urology;  Laterality: Left;  . CYSTOSCOPY WITH STENT PLACEMENT Right 10/12/2016   Procedure: CYSTOSCOPY WITH STENT PLACEMENT;  Surgeon: Hollice Espy, MD;  Location: [facility];  Service: Urology;  Laterality: Right;  . DIALYSIS/PERMA CATHETER INSERTION N/A 11/20/2016   Procedure: Dialysis/Perma Catheter Insertion;  Surgeon: Algernon Huxley, MD;  Location: [place] [facility];  Service: Cardiovascular;  Laterality: N/A;  . DIALYSIS/PERMA CATHETER REMOVAL N/A 03/30/2017   Procedure: DIALYSIS/PERMA CATHETER REMOVAL;  Surgeon: Katha Cabal, MD;  Location: [place] [facility];  Service: Cardiovascular;  Laterality: N/A;  . KIDNEY SURGERY  01/21/2016   IR NEPHROSTOMY PLACEMENT LEFT   . PERIPHERAL VASCULAR CATHETERIZATION N/A 04/07/2016   Procedure: Glori Luis Cath Insertion;  Surgeon: Katha Cabal, MD;  Location: [place] [facility];  Service: Cardiovascular;  Laterality: N/A;  . PERIPHERAL VASCULAR THROMBECTOMY Left 06/02/2017   Procedure: PERIPHERAL VASCULAR THROMBECTOMY;  Surgeon: Algernon Huxley, MD;  Location: [place] [facility];  Service: Cardiovascular;  Laterality: Left;  . PERIPHERAL VASCULAR THROMBECTOMY Left 01/28/2018   Procedure: PERIPHERAL VASCULAR THROMBECTOMY;  Surgeon: Katha Cabal, MD;  Location: [facility];  Service: Cardiovascular;  Laterality: Left;  . PERIPHERAL VASCULAR THROMBECTOMY Left 03/14/2018   Procedure: PERIPHERAL VASCULAR THROMBECTOMY;  Surgeon: Algernon Huxley, MD;   Location: [facility] [facility];  Service: Cardiovascular;  Laterality: Left;  . PERIPHERAL VASCULAR THROMBECTOMY Left 03/16/2018   Procedure: PERIPHERAL VASCULAR THROMBECTOMY;  Surgeon: Katha Cabal, MD;  Location: [place] [facility];  Service: Cardiovascular;  Laterality: Left;  . PORTA CATH REMOVAL N/A 02/07/2019   Procedure: PORTA CATH REMOVAL;  Surgeon: Katha Cabal, MD;  Location: [place] [facility];  Service: Cardiovascular;  Laterality: N/A;  . PORTACATH PLACEMENT Right   . ROTATOR CUFF REPAIR Left   . TEMPORARY DIALYSIS CATHETER N/A 05/11/2018   Procedure: TEMPORARY DIALYSIS CATHETER;  Surgeon: Katha Cabal, MD;  Location: [place] [facility];  Service: Cardiovascular;  Laterality: N/A;  . TEMPORARY DIALYSIS CATHETER Left 07/27/2018   Procedure: TEMPORARY DIALYSIS CATHETER;  Surgeon: Katha Cabal, MD;  Location: [facility];  Service: Cardiovascular;  Laterality: Left;  . TRANSURETHRAL RESECTION OF BLADDER TUMOR N/A 02/17/2016   Procedure: TRANSURETHRAL RESECTION OF BLADDER TUMOR (TURBT)-LARGE;  Surgeon: Hollice Espy, MD;  Location: [facility];  Service: Urology;  Laterality: N/A;  . TRANSURETHRAL RESECTION OF BLADDER TUMOR N/A 10/07/2017   Procedure: TRANSURETHRAL RESECTION OF BLADDER TUMOR (TURBT)-small;  Surgeon: Hollice Espy, MD;  Location: [facility];  Service: Urology;  Laterality: N/A;  . URETEROSCOPY  Left 02/17/2016   Procedure: URETEROSCOPY;  Surgeon: Hollice Espy, MD;  Location: [facility];  Service: Urology;  Laterality: Left;  . URETEROSCOPY Right 10/12/2016   Procedure: URETEROSCOPY;  Surgeon: Hollice Espy, MD;  Location: [facility];  Service: Urology;  Laterality: Right;    Social History   Socioeconomic History  . Marital status: Married    Spouse name: Not on file  . Number of children: Not on file  . Years of education: Not on file  . Highest education level: Not on file  Occupational History  . Occupation: retired  Tobacco Use    . Smoking status: Former Smoker    Types: Cigarettes    Quit date: 07/20/2002    Years since quitting: 17.0  . Smokeless tobacco: Never Used  . Tobacco comment: 01/22/2016   "  quit smoking many years ago "  Substance and Sexual Activity  . Alcohol use: No  . Drug use: No  . Sexual activity: Never  Other Topics Concern  . Not on file  Social History Narrative  . Not on file   Social Determinants of Health   Financial Resource Strain:   . Difficulty of Paying Living Expenses: Not on file  Food Insecurity:   . Worried About Charity fundraiser in the Last Year: Not on file  . Ran Out of Food in the Last Year: Not on file  Transportation Needs:   . Lack of Transportation (Medical): Not on file  . Lack of Transportation (Non-Medical): Not on file  Physical Activity:   . Days of Exercise per Week: Not on file  . Minutes of Exercise per Session: Not on file  Stress:   . Feeling of Stress : Not on file  Social Connections:   . Frequency of Communication with Friends and Family: Not on file  . Frequency of Social Gatherings with Friends and Family: Not on file  . Attends Religious Services: Not on file  . Active Member of Clubs or Organizations: Not on file  . Attends Archivist Meetings: Not on file  . Marital Status: Not on file  Intimate Partner Violence:   . Fear of Current or Ex-Partner: Not on file  . Emotionally Abused: Not on file  . Physically Abused: Not on file  . Sexually Abused: Not on file    Family History  Problem Relation Age of Onset  . Diabetes Mother   . Cancer Father   . Breast cancer Neg Hx   . Kidney disease Neg Hx     Allergies  Allergen Reactions  . Glipizide Er Other (See Comments)    Hypoglycemia   . Percocet [Oxycodone-Acetaminophen] Hives     Review of Systems   Review of Systems: Negative Unless Checked Constitutional: [] Weight loss  [] Fever  [] Chills Cardiac: [] Chest pain   []  Atrial Fibrillation  [] Palpitations    [] Shortness of breath when laying flat   [] Shortness of breath with exertion. [] Shortness of breath at rest Vascular:  [] Pain in legs with walking   [] Pain in legs with standing [] Pain in legs when laying flat   [] Claudication    [] Pain in feet when laying flat    [] History of DVT   [] Phlebitis   [] Swelling in legs   [] Varicose veins   [] Non-healing ulcers Pulmonary:   [] Uses home oxygen   [] Productive cough   [] Hemoptysis   [] Wheeze  [] COPD   [] Asthma Neurologic:  [] Dizziness   [] Seizures  [] Blackouts [] History of stroke   [] History  of TIA  [] Aphasia   [] Temporary Blindness   [] Weakness or numbness in arm   [] Weakness or numbness in leg Musculoskeletal:   [] Joint swelling   [] Joint pain   [] Low back pain  []  History of Knee Replacement [x] Arthritis [] back Surgeries  []  Spinal Stenosis    Hematologic:  [] Easy bruising  [] Easy bleeding   [] Hypercoagulable state   [x] Anemic Gastrointestinal:  [] Diarrhea   [] Vomiting  [] Gastroesophageal reflux/heartburn   [] Difficulty swallowing. [] Abdominal pain Genitourinary:  [x] Chronic kidney disease   [] Difficult urination  [] Anuric   [] Blood in urine [] Frequent urination  [] Burning with urination   [] Hematuria Skin:  [] Rashes   [] Ulcers [] Wounds Psychological:  [] History of anxiety   []  History of major depression  []  Memory Difficulties      OBJECTIVE:   Physical Exam  BP (!) 146/62 (BP Location: Right Arm)   Pulse 75   Resp (!) 22   Ht 5' 8"  (1.727 m)   Wt 172 lb (78 kg)   BMI 26.15 kg/m   Gen: WD/WN, NAD Head: [place]/AT, No temporalis wasting.  Ear/Nose/Throat: Hearing grossly intact, nares w/o erythema or drainage Eyes: PER, EOMI, sclera nonicteric.  Neck: Supple, no masses.  No JVD.  Pulmonary:  Good air movement, no use of accessory muscles.  Cardiac: RRR Vascular: good thrill and bruit Vessel Right Left  Radial Palpable Palpable   Gastrointestinal: soft, non-distended. No guarding/no peritoneal signs.  Musculoskeletal: M/S 5/5 throughout.   No deformity or atrophy.  Neurologic: Pain and light touch intact in extremities.  Symmetrical.  Speech is fluent. Motor exam as listed above. Psychiatric: Judgment intact, Mood & affect appropriate for pt's clinical situation. Dermatologic: No Venous rashes. No Ulcers Noted.  No changes consistent with cellulitis. Lymph : No Cervical lymphadenopathy, no lichenification or skin changes of chronic lymphedema.       ASSESSMENT AND PLAN:  1. End stage renal disease ([place]) Recommend:  The patient is doing well and currently has adequate dialysis access. The patient's dialysis center is not reporting any access issues. Flow pattern is stable when compared to the prior ultrasound.  The patient should have a duplex ultrasound of the dialysis access in 6 months. The patient will follow-up with me in the office after each ultrasound    - VAS [place] (AVF,AVG); Future  2. Essential hypertension Continue antihypertensive medications as already ordered, these medications have been reviewed and there are no changes at this time.   3. Chronic obstructive pulmonary disease, unspecified COPD type ([place]) Continue pulmonary medications and aerosols as already ordered, these medications have been reviewed and there are no changes at this time.     Current Outpatient Medications on File Prior to Visit  Medication Sig Dispense Refill  . ACCU-CHEK AVIVA PLUS test strip 1 each by Other route daily. use as directed  3  . ACCU-CHEK SOFTCLIX LANCETS lancets INJECT 1 EACH AS DIRECTED ONCE DAILY.  3  . acetaminophen (TYLENOL) 500 MG tablet Take 1,000 mg by mouth 2 (two) times daily as needed for moderate pain or headache.     . albuterol (PROVENTIL HFA;VENTOLIN HFA) 108 (90 Base) MCG/ACT inhaler Inhale 2 puffs into the lungs every 6 (six) hours as needed for wheezing or shortness of breath.     Marland Kitchen atorvastatin (LIPITOR) 40 MG tablet Take 40 mg by mouth every evening.    . Blood Glucose  Monitoring Suppl (ACCU-CHEK AVIVA PLUS) w/Device KIT (USE TO MONITOR BLOOD SUGARS.)  0  . calcium acetate (  PHOSLO) 667 MG capsule Take 2 capsules (1,334 mg total) by mouth 3 (three) times daily with meals. (Patient taking differently: Take 1,334 mg by mouth See admin instructions. Take 1334 mg with each meal and snack) 90 capsule 0  . calcium carbonate (TUMS - DOSED IN MG ELEMENTAL CALCIUM) 500 MG chewable tablet Chew 1 tablet by mouth daily.    . cholecalciferol (VITAMIN D) 1000 units tablet Take 1,000 Units by mouth daily.    . cinacalcet (SENSIPAR) 30 MG tablet Take 30 mg by mouth daily. With largest meal    . diazepam (VALIUM) 2 MG tablet Take 2 mg by mouth every 12 (twelve) hours as needed for anxiety.    Marland Kitchen esomeprazole (NEXIUM) 40 MG capsule Take 40 mg by mouth daily after breakfast.     . ferrous sulfate 325 (65 FE) MG EC tablet Take 325 mg by mouth daily after breakfast.     . furosemide (LASIX) 40 MG tablet Take 40 mg by mouth 2 (two) times daily.     Marland Kitchen gabapentin (NEURONTIN) 300 MG capsule Take 600 mg by mouth 3 (three) times daily.    Marland Kitchen glucose blood test strip USE AS DIRECTED ONCE DAILY    . loperamide (IMODIUM) 1 MG/5ML solution Take 2 mg by mouth as needed for diarrhea or loose stools.    Marland Kitchen losartan (COZAAR) 50 MG tablet Take 50 mg by mouth every evening.    . mirabegron ER (MYRBETRIQ) 25 MG TB24 tablet Take 25 mg by mouth daily.     . multivitamin (RENA-VIT) TABS tablet Take 1 tablet by mouth at bedtime.  0  . NON FORMULARY Place 1 Units into the nose at bedtime. CPAP Time of use 2100-0600    . Nutritional Supplements (FEEDING SUPPLEMENT, NEPRO CARB STEADY,) LIQD Take 237 mLs by mouth 2 (two) times daily between meals.  0  . oxybutynin (DITROPAN) 5 MG tablet Take 5 mg by mouth 3 (three) times daily.    . OXYGEN Inhale 2 L into the lungs at bedtime.    . vitamin C (VITAMIN C) 500 MG tablet Take 1 tablet (500 mg total) by mouth 2 (two) times daily.     No current  facility-administered medications on file prior to visit.    There are no Patient Instructions on file for this visit. No follow-ups on file.   Kris Hartmann, NP  This note was completed with Sales executive.  Any errors are purely unintentional.

## 2019-07-17 ENCOUNTER — Ambulatory Visit: Payer: Medicare Other

## 2019-07-25 ENCOUNTER — Other Ambulatory Visit: Payer: Self-pay

## 2019-07-25 DIAGNOSIS — C670 Malignant neoplasm of trigone of bladder: Secondary | ICD-10-CM

## 2019-07-26 ENCOUNTER — Inpatient Hospital Stay: Payer: Medicare Other | Attending: Internal Medicine

## 2019-07-26 ENCOUNTER — Inpatient Hospital Stay (HOSPITAL_BASED_OUTPATIENT_CLINIC_OR_DEPARTMENT_OTHER): Payer: Medicare Other | Admitting: Internal Medicine

## 2019-07-26 ENCOUNTER — Other Ambulatory Visit: Payer: Self-pay

## 2019-07-26 DIAGNOSIS — Z79899 Other long term (current) drug therapy: Secondary | ICD-10-CM | POA: Diagnosis not present

## 2019-07-26 DIAGNOSIS — C670 Malignant neoplasm of trigone of bladder: Secondary | ICD-10-CM | POA: Diagnosis not present

## 2019-07-26 DIAGNOSIS — N186 End stage renal disease: Secondary | ICD-10-CM | POA: Diagnosis not present

## 2019-07-26 DIAGNOSIS — I120 Hypertensive chronic kidney disease with stage 5 chronic kidney disease or end stage renal disease: Secondary | ICD-10-CM | POA: Insufficient documentation

## 2019-07-26 DIAGNOSIS — Z923 Personal history of irradiation: Secondary | ICD-10-CM | POA: Insufficient documentation

## 2019-07-26 DIAGNOSIS — D631 Anemia in chronic kidney disease: Secondary | ICD-10-CM | POA: Diagnosis not present

## 2019-07-26 LAB — COMPREHENSIVE METABOLIC PANEL
ALT: 10 U/L (ref 0–44)
AST: 16 U/L (ref 15–41)
Albumin: 3.9 g/dL (ref 3.5–5.0)
Alkaline Phosphatase: 97 U/L (ref 38–126)
Anion gap: 13 (ref 5–15)
BUN: 19 mg/dL (ref 8–23)
CO2: 26 mmol/L (ref 22–32)
Calcium: 8.5 mg/dL — ABNORMAL LOW (ref 8.9–10.3)
Chloride: 97 mmol/L — ABNORMAL LOW (ref 98–111)
Creatinine, Ser: 5.11 mg/dL — ABNORMAL HIGH (ref 0.44–1.00)
GFR calc Af Amer: 9 mL/min — ABNORMAL LOW (ref >60)
GFR calc non Af Amer: 7 mL/min — ABNORMAL LOW (ref >60)
Glucose, Bld: 104 mg/dL — ABNORMAL HIGH (ref 70–99)
Potassium: 3.1 mmol/L — ABNORMAL LOW (ref 3.5–5.1)
Sodium: 136 mmol/L (ref 135–145)
Total Bilirubin: 0.4 mg/dL (ref 0.3–1.2)
Total Protein: 9 g/dL — ABNORMAL HIGH (ref 6.5–8.1)

## 2019-07-26 LAB — CBC WITH DIFFERENTIAL/PLATELET
Abs Immature Granulocytes: 0.03 10*3/uL (ref 0.00–0.07)
Basophils Absolute: 0 10*3/uL (ref 0.0–0.1)
Basophils Relative: 1 %
Eosinophils Absolute: 0.1 10*3/uL (ref 0.0–0.5)
Eosinophils Relative: 2 %
HCT: 35.8 % — ABNORMAL LOW (ref 36.0–46.0)
Hemoglobin: 10.9 g/dL — ABNORMAL LOW (ref 12.0–15.0)
Immature Granulocytes: 1 %
Lymphocytes Relative: 27 %
Lymphs Abs: 1.4 10*3/uL (ref 0.7–4.0)
MCH: 31.2 pg (ref 26.0–34.0)
MCHC: 30.4 g/dL (ref 30.0–36.0)
MCV: 102.6 fL — ABNORMAL HIGH (ref 80.0–100.0)
Monocytes Absolute: 0.4 10*3/uL (ref 0.1–1.0)
Monocytes Relative: 8 %
Neutro Abs: 3.2 10*3/uL (ref 1.7–7.7)
Neutrophils Relative %: 61 %
Platelets: 187 10*3/uL (ref 150–400)
RBC: 3.49 MIL/uL — ABNORMAL LOW (ref 3.87–5.11)
RDW: 15.4 % (ref 11.5–15.5)
WBC: 5.2 10*3/uL (ref 4.0–10.5)
nRBC: 0 % (ref 0.0–0.2)

## 2019-07-26 NOTE — Assessment & Plan Note (Addendum)
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  Stable.  December 2019 cystoscopy negative.  # PET jan 6th 2020-  was negative for metastatic disease.   Clinically no concern for recurrence. Awaiting CT scan tomorrow.  Again reminded the timing date for the scan.  # Anemia/hemoglobin ~10.9; STABLE continue p.o. iron.  # End-stage renal disease on dialysis [Dr.Singh]; s/p fistula.  STABLE.   # DISPOSITION: will call with results # follow up in 6 month--MD;/labscbc/cmp; Dr.B

## 2019-07-26 NOTE — Progress Notes (Signed)
[place] OFFICE PROGRESS NOTE  Patient Care Team: [name], MD as PCP - General (Internal Medicine)  Cancer Staging No matching staging information was found for the patient.   Oncology History Overview Note   #AUG 2017 TURBT- [Dr.Brandon] Large nodular proximally 5 cm bladder mass involving the left hemitrigone extending up the left lateral bladder wall, 2 discrete nodules with bridge of abnormal tissue; Tumor clinically T2. TURBT- Tumor invades muscularis propria [detrusor muscle]. NOT candidate for cystectomy.  # SEP 2017- RT- GEM weekly x2 [gem discontinued sec to poor tolerance]; finished RT dec 2017; Nov 2018- cysto- NEG path [Dr.Brandon]  # ESRD on HD-Dr.Singh / C.diff [aug 2017]  DIAGNOSIS: Muscle invasive bladder ca  STAGE:  II       ;GOALS: curative  CURRENT/MOST RECENT THERAPY : surveillaince    Cancer of trigone of urinary bladder ([place])       INTERVAL HISTORY: Patient a poor historian.  No family.  Barbara [name] 79 y.o.  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  Patient was recently admitted to hospital for mental status changes-unclear etiology.  Discharged to rehab.  Currently recuperating fairly well.  Patient continues to complain of chronic joint pain chronic back pain pain not any worse.  Denies any blood in urine.  She is currently getting dialysis through fistula.   Review of Systems  Constitutional: Positive for malaise/fatigue. Negative for chills, diaphoresis, fever and weight loss.  HENT: Negative for nosebleeds and sore throat.   Eyes: Negative for double vision.  Respiratory: Negative for cough, hemoptysis, sputum production, shortness of breath and wheezing.   Cardiovascular: Positive for leg swelling. Negative for chest pain, palpitations and orthopnea.  Gastrointestinal: Negative  for abdominal pain, blood in stool, constipation, diarrhea, heartburn, melena, nausea and vomiting.  Genitourinary: Negative for dysuria, frequency and urgency.  Musculoskeletal: Positive for back pain and joint pain.  Skin: Negative.  Negative for itching and rash.  Neurological: Negative for dizziness, tingling, focal weakness, weakness and headaches.  Endo/Heme/Allergies: Does not bruise/bleed easily.  Psychiatric/Behavioral: Negative for depression. The patient is not nervous/anxious and does not have insomnia.      PAST MEDICAL HISTORY :  Past Medical History:  Diagnosis Date  . Adult celiac disease   . Anemia associated with chronic renal failure 2017   blood transfusion last week 10/17  . Arthritis   . Asthma   . Cancer ([place]) 2017   bladder  . Chronic kidney disease   . 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  dr. Candiss Norse and dr. Holley Raring follow her  . COPD (chronic obstructive pulmonary disease) ([place])   . Coronary artery disease   . Diabetes mellitus without complication ([place])   . Dialysis patient ([place])    Tues, Thurs, Sat  . Dyspnea    with exertion  . Elevated lipids   . GERD (gastroesophageal reflux disease)   . Hematuria   . Hypertension   . Lower back pain   . Neuropathy   . Oxygen dependent    at hs  . Personal history of chemotherapy 2017   bladder ca  . Personal history of radiation therapy 2017   bladder ca  . Sleep apnea    uses cpap  . Urinary obstruction 01/2016    PAST SURGICAL HISTORY :   Past Surgical History:  Procedure Laterality Date  .  A/V SHUNT INTERVENTION N/A 05/12/2018   Procedure: A/V SHUNT INTERVENTION;  Surgeon: Algernon Huxley, MD;  Location: [facility];  Service: Cardiovascular;  Laterality: N/A;  . A/V SHUNT INTERVENTION Left 07/29/2018   Procedure: A/V SHUNT INTERVENTION;  Surgeon: Katha Cabal, MD;  Location: [facility];  Service: Cardiovascular;  Laterality: Left;  . ABDOMINAL HYSTERECTOMY     . AV FISTULA PLACEMENT Left 02/17/2017   Procedure: INSERTION OF ARTERIOVENOUS (AV) GORE-TEX GRAFT ARM(BRACHIAL AXILLARY);  Surgeon: Katha Cabal, MD;  Location: [facility];  Service: Vascular;  Laterality: Left;  . CYSTOSCOPY W/ RETROGRADES Bilateral 02/17/2016   Procedure: CYSTOSCOPY WITH RETROGRADE PYELOGRAM;  Surgeon: Hollice Espy, MD;  Location: [facility];  Service: Urology;  Laterality: Bilateral;  . CYSTOSCOPY W/ RETROGRADES Bilateral 10/12/2016   Procedure: CYSTOSCOPY WITH RETROGRADE PYELOGRAM;  Surgeon: Hollice Espy, MD;  Location: [facility];  Service: Urology;  Laterality: Bilateral;  . CYSTOSCOPY W/ RETROGRADES Bilateral 06/09/2017   Procedure: CYSTOSCOPY WITH RETROGRADE PYELOGRAM;  Surgeon: Hollice Espy, MD;  Location: [facility];  Service: Urology;  Laterality: Bilateral;  . CYSTOSCOPY W/ RETROGRADES Bilateral 10/07/2017   Procedure: CYSTOSCOPY WITH RETROGRADE PYELOGRAM;  Surgeon: Hollice Espy, MD;  Location: [facility];  Service: Urology;  Laterality: Bilateral;  . CYSTOSCOPY W/ URETERAL STENT PLACEMENT Left 05/12/2016   Procedure: CYSTOSCOPY WITH STENT REPLACEMENT;  Surgeon: Hollice Espy, MD;  Location: [facility];  Service: Urology;  Laterality: Left;  . CYSTOSCOPY W/ URETERAL STENT PLACEMENT Left 10/12/2016   Procedure: CYSTOSCOPY WITH STENT REPLACEMENT;  Surgeon: Hollice Espy, MD;  Location: [facility];  Service: Urology;  Laterality: Left;  . CYSTOSCOPY WITH BIOPSY N/A 06/09/2017   Procedure: CYSTOSCOPY WITH BLADDER BIOPSY;  Surgeon: Hollice Espy, MD;  Location: [facility];  Service: Urology;  Laterality: N/A;  . CYSTOSCOPY WITH STENT PLACEMENT Left 01/21/2016   Procedure: CYSTOSCOPY WITH double J STENT PLACEMENT;  Surgeon: Franchot Gallo, MD;  Location: [facility];  Service: Urology;  Laterality: Left;  . CYSTOSCOPY WITH STENT PLACEMENT Right 10/12/2016   Procedure: CYSTOSCOPY WITH STENT PLACEMENT;  Surgeon: Hollice Espy, MD;  Location: [facility];  Service: Urology;   Laterality: Right;  . DIALYSIS/PERMA CATHETER INSERTION N/A 11/20/2016   Procedure: Dialysis/Perma Catheter Insertion;  Surgeon: Algernon Huxley, MD;  Location: [facility];  Service: Cardiovascular;  Laterality: N/A;  . DIALYSIS/PERMA CATHETER REMOVAL N/A 03/30/2017   Procedure: DIALYSIS/PERMA CATHETER REMOVAL;  Surgeon: Katha Cabal, MD;  Location: [facility];  Service: Cardiovascular;  Laterality: N/A;  . KIDNEY SURGERY  01/21/2016   IR NEPHROSTOMY PLACEMENT LEFT   . PERIPHERAL VASCULAR CATHETERIZATION N/A 04/07/2016   Procedure: Glori Luis Cath Insertion;  Surgeon: Katha Cabal, MD;  Location: [facility];  Service: Cardiovascular;  Laterality: N/A;  . PERIPHERAL VASCULAR THROMBECTOMY Left 06/02/2017   Procedure: PERIPHERAL VASCULAR THROMBECTOMY;  Surgeon: Algernon Huxley, MD;  Location: [facility] [facility];  Service: Cardiovascular;  Laterality: Left;  . PERIPHERAL VASCULAR THROMBECTOMY Left 01/28/2018   Procedure: PERIPHERAL VASCULAR THROMBECTOMY;  Surgeon: Katha Cabal, MD;  Location: [facility];  Service: Cardiovascular;  Laterality: Left;  . PERIPHERAL VASCULAR THROMBECTOMY Left 03/14/2018   Procedure: PERIPHERAL VASCULAR THROMBECTOMY;  Surgeon: Algernon Huxley, MD;  Location: [facility];  Service: Cardiovascular;  Laterality: Left;  . PERIPHERAL VASCULAR THROMBECTOMY Left 03/16/2018   Procedure: PERIPHERAL VASCULAR THROMBECTOMY;  Surgeon: Katha Cabal, MD;  Location: [facility];  Service: Cardiovascular;  Laterality:  Left;  . PORTA CATH REMOVAL N/A 02/07/2019   Procedure: PORTA CATH REMOVAL;  Surgeon: Katha Cabal, MD;  Location: [facility];  Service: Cardiovascular;  Laterality: N/A;  . PORTACATH PLACEMENT Right   . ROTATOR CUFF REPAIR Left   . TEMPORARY DIALYSIS CATHETER N/A 05/11/2018   Procedure: TEMPORARY DIALYSIS CATHETER;  Surgeon: Katha Cabal, MD;  Location: [facility];  Service:  Cardiovascular;  Laterality: N/A;  . TEMPORARY DIALYSIS CATHETER Left 07/27/2018   Procedure: TEMPORARY DIALYSIS CATHETER;  Surgeon: Katha Cabal, MD;  Location: [facility];  Service: Cardiovascular;  Laterality: Left;  . TRANSURETHRAL RESECTION OF BLADDER TUMOR N/A 02/17/2016   Procedure: TRANSURETHRAL RESECTION OF BLADDER TUMOR (TURBT)-LARGE;  Surgeon: Hollice Espy, MD;  Location: [facility];  Service: Urology;  Laterality: N/A;  . TRANSURETHRAL RESECTION OF BLADDER TUMOR N/A 10/07/2017   Procedure: TRANSURETHRAL RESECTION OF BLADDER TUMOR (TURBT)-small;  Surgeon: Hollice Espy, MD;  Location: [facility];  Service: Urology;  Laterality: N/A;  . URETEROSCOPY Left 02/17/2016   Procedure: URETEROSCOPY;  Surgeon: Hollice Espy, MD;  Location: [facility];  Service: Urology;  Laterality: Left;  . URETEROSCOPY Right 10/12/2016   Procedure: URETEROSCOPY;  Surgeon: Hollice Espy, MD;  Location: [facility];  Service: Urology;  Laterality: Right;    FAMILY HISTORY :   Family History  Problem Relation Age of Onset  . Diabetes Mother   . Cancer Father   . Breast cancer Neg Hx   . Kidney disease Neg Hx     SOCIAL HISTORY:   Social History   Tobacco Use  . Smoking status: Former Smoker    Types: Cigarettes    Quit date: 07/20/2002    Years since quitting: 17.0  . Smokeless tobacco: Never Used  . Tobacco comment: 01/22/2016   "  quit smoking many years ago "  Substance Use Topics  . Alcohol use: No  . Drug use: No    ALLERGIES:  is allergic to glipizide er and percocet [oxycodone-acetaminophen].  MEDICATIONS:  Current Outpatient Medications  Medication Sig Dispense Refill  . ACCU-CHEK AVIVA PLUS test strip 1 each by Other route daily. use as directed  3  . ACCU-CHEK SOFTCLIX LANCETS lancets INJECT 1 EACH AS DIRECTED ONCE DAILY.  3  . albuterol (PROVENTIL HFA;VENTOLIN HFA) 108 (90 Base) MCG/ACT inhaler Inhale 2 puffs into the lungs every 6 (six) hours as needed for wheezing or  shortness of breath.     Marland Kitchen atorvastatin (LIPITOR) 40 MG tablet Take 40 mg by mouth every evening.    . Blood Glucose Monitoring Suppl (ACCU-CHEK AVIVA PLUS) w/Device KIT (USE TO MONITOR BLOOD SUGARS.)  0  . calcium acetate (PHOSLO) 667 MG capsule Take 2 capsules (1,334 mg total) by mouth 3 (three) times daily with meals. (Patient taking differently: Take 1,334 mg by mouth See admin instructions. Take 1334 mg with each meal and snack) 90 capsule 0  . calcium carbonate (TUMS - DOSED IN MG ELEMENTAL CALCIUM) 500 MG chewable tablet Chew 1 tablet by mouth daily.    . cholecalciferol (VITAMIN D) 1000 units tablet Take 1,000 Units by mouth daily.    . cinacalcet (SENSIPAR) 30 MG tablet Take 30 mg by mouth daily. With largest meal    . diazepam (VALIUM) 2 MG tablet Take 2 mg by mouth every 12 (twelve) hours as needed for anxiety.    Marland Kitchen esomeprazole (NEXIUM) 40 MG capsule Take 40 mg by mouth daily after breakfast.     .  ferrous sulfate 325 (65 FE) MG EC tablet Take 325 mg by mouth daily after breakfast.     . furosemide (LASIX) 40 MG tablet Take 40 mg by mouth 2 (two) times daily.     Marland Kitchen gabapentin (NEURONTIN) 300 MG capsule Take 600 mg by mouth 3 (three) times daily.    Marland Kitchen glucose blood test strip USE AS DIRECTED ONCE DAILY    . ibuprofen (ADVIL) 200 MG tablet Take 200 mg by mouth every 6 (six) hours as needed.    Marland Kitchen losartan (COZAAR) 50 MG tablet Take 50 mg by mouth every evening.    . multivitamin (RENA-VIT) TABS tablet Take 1 tablet by mouth at bedtime.  0  . NON FORMULARY Place 1 Units into the nose at bedtime. CPAP Time of use 2100-0600    . oxybutynin (DITROPAN) 5 MG tablet Take 5 mg by mouth 3 (three) times daily.    . OXYGEN Inhale 2 L into the lungs at bedtime.    . vitamin C (VITAMIN C) 500 MG tablet Take 1 tablet (500 mg total) by mouth 2 (two) times daily.    Marland Kitchen acetaminophen (TYLENOL) 500 MG tablet Take 1,000 mg by mouth 2 (two) times daily as needed for moderate pain or headache.     .  loperamide (IMODIUM) 1 MG/5ML solution Take 2 mg by mouth as needed for diarrhea or loose stools.    . mirabegron ER (MYRBETRIQ) 25 MG TB24 tablet Take 25 mg by mouth daily.     . Nutritional Supplements (FEEDING SUPPLEMENT, NEPRO CARB STEADY,) LIQD Take 237 mLs by mouth 2 (two) times daily between meals. (Patient not taking: Reported on 07/26/2019)  0   No current facility-administered medications for this visit.    PHYSICAL EXAMINATION: ECOG PERFORMANCE STATUS: 2 - Symptomatic, <50% confined to bed  Wt 172 lb (78 kg)   BMI 26.15 kg/m   Filed Weights   07/26/19 1404  Weight: 172 lb (78 kg)    Physical Exam  Constitutional: She is oriented to person, place, and time and well-developed, well-nourished, and in no distress.  Obese.  Alone.  In a wheelchair.  HENT:  Head: Normocephalic and atraumatic.  Mouth/Throat: Oropharynx is clear and moist. No oropharyngeal exudate.  Eyes: Pupils are equal, round, and reactive to light.  Cardiovascular: Normal rate and regular rhythm.  Pulmonary/Chest: No respiratory distress. She has no wheezes.  Decreased breath sounds bilaterally bases.  Abdominal: Soft. Bowel sounds are normal. She exhibits no distension and no mass. There is no abdominal tenderness. There is no rebound and no guarding.  Musculoskeletal:        General: Edema (Mild leg swelling.) present. No tenderness. Normal range of motion.     Cervical back: Normal range of motion and neck supple.  Neurological: She is alert and oriented to person, place, and time.  Skin: Skin is warm.  Psychiatric: Affect normal.    LABORATORY DATA:  I have reviewed the data as listed    Component Value Date/Time   NA 136 07/26/2019 1341   NA 138 11/04/2016 1453   NA 138 08/07/2012 0433   K 3.1 (L) 07/26/2019 1341   K 3.2 (L) 10/30/2014 1410   CL 97 (L) 07/26/2019 1341   CL 104 08/07/2012 0433   CO2 26 07/26/2019 1341   CO2 25 08/07/2012 0433   GLUCOSE 104 (H) 07/26/2019 1341   GLUCOSE 172  (H) 08/07/2012 0433   BUN 19 07/26/2019 1341   BUN 69 (H) 11/04/2016  1453   BUN 63 (H) 08/07/2012 0433   CREATININE 5.11 (H) 07/26/2019 1341   CREATININE 2.48 (H) 08/07/2012 0433   CALCIUM 8.5 (L) 07/26/2019 1341   CALCIUM 9.1 08/07/2012 0433   PROT 9.0 (H) 07/26/2019 1341   PROT 8.9 (H) 08/02/2012 0958   ALBUMIN 3.9 07/26/2019 1341   ALBUMIN 3.9 08/02/2012 0958   AST 16 07/26/2019 1341   AST 21 08/02/2012 0958   ALT 10 07/26/2019 1341   ALT 19 08/02/2012 0958   ALKPHOS 97 07/26/2019 1341   ALKPHOS 107 08/02/2012 0958   BILITOT 0.4 07/26/2019 1341   BILITOT 0.3 08/02/2012 0958   GFRNONAA 7 (L) 07/26/2019 1341   GFRNONAA 19 (L) 08/07/2012 0433   GFRAA 9 (L) 07/26/2019 1341   GFRAA 22 (L) 08/07/2012 0433    No results found for: SPEP, UPEP  Lab Results  Component Value Date   WBC 5.2 07/26/2019   NEUTROABS 3.2 07/26/2019   HGB 10.9 (L) 07/26/2019   HCT 35.8 (L) 07/26/2019   MCV 102.6 (H) 07/26/2019   PLT 187 07/26/2019      Chemistry      Component Value Date/Time   NA 136 07/26/2019 1341   NA 138 11/04/2016 1453   NA 138 08/07/2012 0433   K 3.1 (L) 07/26/2019 1341   K 3.2 (L) 10/30/2014 1410   CL 97 (L) 07/26/2019 1341   CL 104 08/07/2012 0433   CO2 26 07/26/2019 1341   CO2 25 08/07/2012 0433   BUN 19 07/26/2019 1341   BUN 69 (H) 11/04/2016 1453   BUN 63 (H) 08/07/2012 0433   CREATININE 5.11 (H) 07/26/2019 1341   CREATININE 2.48 (H) 08/07/2012 0433      Component Value Date/Time   CALCIUM 8.5 (L) 07/26/2019 1341   CALCIUM 9.1 08/07/2012 0433   ALKPHOS 97 07/26/2019 1341   ALKPHOS 107 08/02/2012 0958   AST 16 07/26/2019 1341   AST 21 08/02/2012 0958   ALT 10 07/26/2019 1341   ALT 19 08/02/2012 0958   BILITOT 0.4 07/26/2019 1341   BILITOT 0.3 08/02/2012 0958       RADIOGRAPHIC STUDIES: I have personally reviewed the radiological images as listed and agreed with the findings in the report. No results found.   ASSESSMENT & PLAN:  Cancer of  trigone of urinary bladder ([place]) # muscle invasive bladder cancer/ pT2/stage II-status post definitive radiation [not cystectomy candidate].  Stable.  December 2019 cystoscopy negative.  # PET jan 6th 2020-  was negative for metastatic disease.   Clinically no concern for recurrence. Awaiting CT scan tomorrow.  Again reminded the timing date for the scan.  # Anemia/hemoglobin ~10.9; STABLE continue p.o. iron.  # End-stage renal disease on dialysis [Dr.Singh]; s/p fistula.  STABLE.   # DISPOSITION: will call with results # follow up in 6 month--MD;/labscbc/cmp; Dr.B    Orders Placed This Encounter  Procedures  . CBC with Differential    Standing Status:   Future    Standing Expiration Date:   07/25/2020  . Comprehensive metabolic panel    Standing Status:   Future    Standing Expiration Date:   07/25/2020   All questions were answered. The patient knows to call the clinic with any problems, questions or concerns.      Cammie Sickle, MD 07/26/2019 2:59 PM

## 2019-07-27 ENCOUNTER — Ambulatory Visit: Admission: RE | Admit: 2019-07-27 | Payer: Medicare Other | Source: Ambulatory Visit

## 2019-08-10 ENCOUNTER — Ambulatory Visit: Admission: RE | Admit: 2019-08-10 | Payer: Medicare Other | Source: Ambulatory Visit

## 2019-08-10 ENCOUNTER — Telehealth: Payer: Self-pay | Admitting: *Deleted

## 2019-08-10 NOTE — Telephone Encounter (Signed)
Attempted to reach patient at 1600- phone just rings. No answer.

## 2019-08-10 NOTE — Telephone Encounter (Signed)
Per CT - patient no showed for ct scan today in the clinic. I attempted to reach patient to determine if she would like to r/s these apts. The phone line is currently busy.

## 2019-08-11 NOTE — Telephone Encounter (Signed)
Spoke with patient. Ct scan r/s. Patient stated that she "did not know about the ct scan apt."  I explained to patient that the CT scan was scheduled at the last apt with Dr. Rogue Bussing.  Apt was r/s and patient was also informed of ct prep instructions. She was reminded to be NPO 4 hours prior to the ct scan. She understands that she needs to pick up her prepkit a few days prior to the ct apt. Pt given new date/time for scan (scheduled on 2/4).

## 2019-08-24 ENCOUNTER — Ambulatory Visit
Admission: RE | Admit: 2019-08-24 | Discharge: 2019-08-24 | Disposition: A | Discharge status: Home / Self Care | Payer: Medicare Other | Source: Ambulatory Visit | Attending: Internal Medicine | Admitting: Internal Medicine

## 2019-08-24 ENCOUNTER — Other Ambulatory Visit: Payer: Self-pay

## 2019-08-24 ENCOUNTER — Other Ambulatory Visit: Payer: Self-pay | Admitting: Surgery

## 2019-08-24 DIAGNOSIS — C670 Malignant neoplasm of trigone of bladder: Secondary | ICD-10-CM

## 2019-08-29 ENCOUNTER — Other Ambulatory Visit: Payer: Self-pay | Admitting: Internal Medicine

## 2019-08-29 ENCOUNTER — Encounter
Admission: RE | Admit: 2019-08-29 | Discharge: 2019-08-29 | Disposition: A | Discharge status: Home / Self Care | Payer: Medicare Other | Source: Ambulatory Visit | Attending: Surgery | Admitting: Surgery

## 2019-08-29 ENCOUNTER — Other Ambulatory Visit: Payer: Self-pay

## 2019-08-29 NOTE — Patient Instructions (Addendum)
Your procedure is scheduled on: 09/05/19 Report to [facility]. To find out your arrival time please call [phone] between 1PM - 3PM on 09/04/19 .  Remember: Instructions that are not followed completely may result in serious medical risk, up to and including death, or upon the discretion of your surgeon and anesthesiologist your surgery may need to be rescheduled.     _X__ 1. Do not eat food after midnight the night before your procedure.                 No gum chewing or hard candies. You may drink clear liquids up to 2 hours                 before you are scheduled to arrive for your surgery- DO not drink clear                 liquids within 2 hours of the start of your surgery.                 Clear Liquids include:  water, apple juice without pulp, clear carbohydrate                 drink such as Clearfast or Gatorade, Black Coffee or Tea (Do not add                 anything to coffee or tea). Diabetics water only  __X__2.  On the morning of surgery brush your teeth with toothpaste and water, you                 may rinse your mouth with mouthwash if you wish.  Do not swallow any              toothpaste of mouthwash.     _X__ 3.  No Alcohol for 24 hours before or after surgery.   _X__ 4.  Do Not Smoke or use e-cigarettes For 24 Hours Prior to Your Surgery.                 Do not use any chewable tobacco products for at least 6 hours prior to                 surgery.  ____  5.  Bring all medications with you on the day of surgery if instructed.   __X__  6.  Notify your doctor if there is any change in your medical condition      (cold, fever, infections).     Do not wear jewelry, make-up, hairpins, clips or nail polish. Do not wear lotions, powders, or perfumes.  Do not shave 48 hours prior to surgery. Men may shave face and neck. Do not bring valuables to the hospital.    [facility] is not responsible for any belongings  or valuables.  Contacts, dentures/partials or body piercings may not be worn into surgery. Bring a case for your contacts, glasses or hearing aids, a denture cup will be supplied. Leave your suitcase in the car. After surgery it may be brought to your room. For patients admitted to the hospital, discharge time is determined by your treatment team.   Patients discharged the day of surgery will not be allowed to drive home.   Please read over the following fact sheets that you were given:   MRSA Information  __X__ Take these medicines the morning of surgery with A SIP OF WATER:  1. esomeprazole (NEXIUM) 40 MG capsule  2. gabapentin (NEURONTIN) 300 MG capsule  3. oxybutynin (DITROPAN) 5 MG tablet  4.  5.  6.  ____ Fleet Enema (as directed)   __X__ Use CHG Soap/SAGE wipes as directed  __X__ Use inhalers on the day of surgery  ____ Stop metformin/Janumet/Farxiga 2 days prior to surgery    ____ Take 1/2 of usual insulin dose the night before surgery. No insulin the morning          of surgery.   ____ Stop Blood Thinners Coumadin/Plavix/Xarelto/Pleta/Pradaxa/Eliquis/Effient/Aspirin  on   Or contact your Surgeon, Cardiologist or Medical Doctor regarding  ability to stop your blood thinners  __X__ Stop Anti-inflammatories 7 days before surgery such as Advil, Ibuprofen, Motrin,  BC or Goodies Powder, Naprosyn, Naproxen, Aleve, Aspirin    __X__ Stop all herbal supplements, fish oil or vitamin E until after surgery.    __X__ Bring C-Pap to the hospital.

## 2019-08-29 NOTE — Progress Notes (Signed)
Patient states she needs some type of home health assistance. She will be having total hip replacement 2/16.

## 2019-08-29 NOTE — Pre-Procedure Instructions (Signed)
Via secure chart Dr Roland Rack agreed not to repeat labs done at [facility] office in December 2020. He is aware patient is on dialysis and no longer makes urine. UA/CX cancelled also.

## 2019-08-29 NOTE — Pre-Procedure Instructions (Signed)
Clearance note from PCP for this visit attached to chart.  ECG 12-lead12/02/2019 [place] Component Name Value Ref Range  Vent Rate (bpm) 74   PR Interval (msec) 206   QRS Interval (msec) 78   QT Interval (msec) 396   QTc (msec) 439   Other Result Information  This result has an attachment that is not available.  Result Narrative  Sinus rhythm with premature atrial complexes with aberrant conduction Otherwise normal ECG When compared with ECG of 15-Mar-2019 11:50, No significant change was found I reviewed and concur with this report. Electronically signed [id number], Jenny Reichmann [phone]) on 07/17/2019 7:16:09 AM

## 2019-08-31 ENCOUNTER — Other Ambulatory Visit: Payer: Medicare Other

## 2019-08-31 ENCOUNTER — Encounter: Payer: Self-pay | Admitting: Urology

## 2019-08-31 ENCOUNTER — Ambulatory Visit (INDEPENDENT_AMBULATORY_CARE_PROVIDER_SITE_OTHER): Payer: Medicare Other | Admitting: Urology

## 2019-08-31 ENCOUNTER — Other Ambulatory Visit: Payer: Self-pay

## 2019-08-31 VITALS — BP 127/51 | HR 67 | Ht 68.0 in | Wt 172.0 lb

## 2019-08-31 DIAGNOSIS — R300 Dysuria: Secondary | ICD-10-CM

## 2019-08-31 DIAGNOSIS — Z8551 Personal history of malignant neoplasm of bladder: Secondary | ICD-10-CM | POA: Diagnosis not present

## 2019-08-31 MED ORDER — CIPROFLOXACIN HCL 250 MG PO TABS
250.0000 mg | ORAL_TABLET | Freq: Once | ORAL | Status: AC
  Administered 2019-08-31: 250 mg via ORAL

## 2019-08-31 NOTE — Progress Notes (Signed)
08/31/19  CC:  Chief Complaint  Patient presents with  . Cysto    HPI: 79 year old female with history of muscle invasive bladder cancer diagnosed in 01/2015 status post TURBT followed by palliative gemcitabine and concurrent radiation.    She also underwent recent CT PET scan on 01/01/2017 which shows marked bladder wall thickening and bilateral hydronephrosis despite ureteral stents in place. There is no evidence of metastatic disease.   She was taken to the operating room on 10/12/2016 at which time the bladder was noted to be diffusely erythematous, small in capacity with newly identified right hydroureteronephrosis down to level of the distal ureter with a negative ureteroscopy. This possibly related to radiation changes of the distal ureter. Bilateral ureteral stents replaced at that time.  She was subsequently started on hemodialysis. 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  There is no evidence of malignancy.  The left UO was scarred down and unable to be cannulated at the time of retrograde pyelogram.  She again returned to the operating room for repeat bladder biopsy on 10/07/2017.  Pathology again negative for malignancy, consisten with radiation change.  Staging imaging in the form of PET scan on 1/6 2020 shows persistent marked diffuse bladder wall thickening, persistent high-grade bilateral hydroureteronephrosis, and nonspecific uptake in enlarged right common iliac lymph node which is been seen on multiple prior imaging which is in fact decreased in size.    She underwent repeat cross-sectional imaging in the form of CT scan of 08/2019.  This showed slightly increased severe diffuse bladder wall thickening up to 2.6 cm.  The adnexal tissue extended from the dome towards the left adnexa is improved.  Most  persistent left hydroureteronephrosis down to this level but again appears improved.  Stable right common iliac lymph node.  Distal rectal wall thickening also appreciated.  Overall, study is stable.  Noted to undergo hip arthroplasty in the near future.  She also notes today that her daughter is on hospice for malignancy.   Today's Vitals   08/31/19 0902  BP: (!) 127/51  Pulse: 67  Weight: 172 lb (78 kg)  Height: 5' 8"  (1.727 m)   Body mass index is 26.15 kg/m. NED. A&Ox3.   No respiratory distress   Abd soft, NT, ND Normal external genitalia with patent urethral meatus  Cystoscopy Procedure Note  Patient identification was confirmed, informed consent was obtained, and patient was prepped using Betadine solution.  Lidocaine jelly was administered per urethral meatus.    Preoperative abx where received prior to procedure.    Procedure: - Flexible cystoscope introduced, without any difficulty.   - Thorough search of the bladder revealed:    Pipe like urethral meatus Mildly trabeculated bladder Multiple diffuse patches of bladder erythema with ulceration consistent with known radiation changes, stable, severe. Check area of necrosis near the dome of the bladder which is able to be dislodged with the beak of the scope Trigone distorted  Post-Procedure: - Cystoscopy well tolerated today  Assessment/ Plan:  1.  History of bladder cancer/bladder lesions NED today- bladder c/w radiation changes, stable Continue surveillance cystoscopy, continue q6 month in absence of recurrence Urine cytology today after cystoscopic manipulation Discussed with Dr. 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 - Microscopic Examination  2. Hydronephrosis Likely related to bilateral radiation changes of the ureter/bladder No further ureteral stenting given progression of end-stage renal disease now on hemodialysis  3. 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

## 2019-08-31 NOTE — Progress Notes (Signed)
Tumor Board Documentation  Barbara [name] was presented by Dr Rogue Bussing at our Tumor Board on 08/31/2019, which included representatives from medical oncology, radiation oncology, navigation, pathology, radiology, surgical, surgical oncology, internal medicine, pharmacy, genetics, palliative care, research.  Barbara [name] currently presents as a current patient, for discussion with history of the following treatments: surgical intervention(s).  Additionally, we reviewed previous medical and familial history, history of present illness, and recent lab results along with all available histopathologic and imaging studies. The tumor board considered available treatment options and made the following recommendations: Active surveillance, Chemotherapy    The following procedures/referrals were also placed: No orders of the defined types were placed in this encounter.   Clinical Trial Status: not discussed   Staging used:    National site-specific guidelines   were discussed with respect to the case.  Tumor board is a meeting of clinicians from various specialty areas who evaluate and discuss patients for whom a multidisciplinary approach is being considered. Final determinations in the plan of care are those of the provider(s). The responsibility for follow up of recommendations given during tumor board is that of the provider.   Today's extended care, comprehensive team conference, Barbara [name] was not present for the discussion and was not examined.   Multidisciplinary Tumor Board is a multidisciplinary case peer review process.  Decisions discussed in the Multidisciplinary Tumor Board reflect the opinions of the specialists present at the conference without having examined the patient.  Ultimately, treatment and diagnostic decisions rest with the primary provider(s) and the patient.

## 2019-08-31 NOTE — Addendum Note (Signed)
Addended by: Verlene Mayer A on: 08/31/2019 10:15 AM   Modules accepted: Orders

## 2019-08-31 NOTE — Addendum Note (Signed)
Addended by: Verlene Mayer A on: 08/31/2019 04:12 PM   Modules accepted: Orders

## 2019-08-31 NOTE — Patient Instructions (Signed)

## 2019-09-01 ENCOUNTER — Other Ambulatory Visit
Admission: RE | Admit: 2019-09-01 | Discharge: 2019-09-01 | Disposition: A | Discharge status: Home / Self Care | Payer: Medicare Other | Source: Ambulatory Visit | Attending: Surgery | Admitting: Surgery

## 2019-09-01 DIAGNOSIS — Z01812 Encounter for preprocedural laboratory examination: Secondary | ICD-10-CM | POA: Insufficient documentation

## 2019-09-01 DIAGNOSIS — Z20822 Contact with and (suspected) exposure to covid-19: Secondary | ICD-10-CM | POA: Insufficient documentation

## 2019-09-01 LAB — SURGICAL PCR SCREEN
MRSA, PCR: POSITIVE — AB
Staphylococcus aureus: POSITIVE — AB

## 2019-09-01 LAB — TYPE AND SCREEN
ABO/RH(D): O POS
Antibody Screen: NEGATIVE

## 2019-09-01 LAB — APTT: aPTT: <24 seconds — ABNORMAL LOW (ref 24–36)

## 2019-09-01 LAB — SEDIMENTATION RATE: Sed Rate: 37 mm/hr — ABNORMAL HIGH (ref 0–30)

## 2019-09-01 LAB — PROTIME-INR
INR: 1 (ref 0.8–1.2)
Prothrombin Time: 12.6 seconds (ref 11.4–15.2)

## 2019-09-01 NOTE — Pre-Procedure Instructions (Signed)
ABNORMAL ESR AND PTT RESULTS FAXED TO DR [facility] OFFICE

## 2019-09-01 NOTE — Pre-Procedure Instructions (Signed)
Pre-Admit Testing Provider Notification Note  Provider Notified: Dr. Roland Rack  Notification Mode: Fax  Reason: + PCR  Response: Fax confirmation received.  Additional Information: Placed on chart. Noted on Pre-Admit Worksheet.  Signed: Beulah Gandy, RN

## 2019-09-02 LAB — SARS CORONAVIRUS 2 (TAT 6-24 HRS): SARS Coronavirus 2: NEGATIVE

## 2019-09-05 ENCOUNTER — Encounter
Admission: RE | Disposition: A | Discharge status: Skilled Nursing Facility | Payer: Self-pay | Source: Home / Self Care | Attending: Internal Medicine

## 2019-09-05 ENCOUNTER — Encounter: Payer: Self-pay | Admitting: Surgery

## 2019-09-05 ENCOUNTER — Inpatient Hospital Stay: Payer: Medicare Other

## 2019-09-05 ENCOUNTER — Inpatient Hospital Stay: Payer: Medicare Other | Admitting: Anesthesiology

## 2019-09-05 ENCOUNTER — Inpatient Hospital Stay
Admission: RE | Admit: 2019-09-05 | Discharge: 2019-09-24 | DRG: 469 | Disposition: A | Discharge status: Skilled Nursing Facility | Payer: Medicare Other | Attending: Internal Medicine | Admitting: Internal Medicine

## 2019-09-05 ENCOUNTER — Other Ambulatory Visit: Payer: Self-pay

## 2019-09-05 DIAGNOSIS — R451 Restlessness and agitation: Secondary | ICD-10-CM | POA: Diagnosis not present

## 2019-09-05 DIAGNOSIS — N2581 Secondary hyperparathyroidism of renal origin: Secondary | ICD-10-CM | POA: Diagnosis present

## 2019-09-05 DIAGNOSIS — E43 Unspecified severe protein-calorie malnutrition: Secondary | ICD-10-CM

## 2019-09-05 DIAGNOSIS — L98429 Non-pressure chronic ulcer of back with unspecified severity: Secondary | ICD-10-CM | POA: Insufficient documentation

## 2019-09-05 DIAGNOSIS — Z515 Encounter for palliative care: Secondary | ICD-10-CM | POA: Diagnosis not present

## 2019-09-05 DIAGNOSIS — E1140 Type 2 diabetes mellitus with diabetic neuropathy, unspecified: Secondary | ICD-10-CM | POA: Diagnosis present

## 2019-09-05 DIAGNOSIS — K219 Gastro-esophageal reflux disease without esophagitis: Secondary | ICD-10-CM | POA: Diagnosis present

## 2019-09-05 DIAGNOSIS — D638 Anemia in other chronic diseases classified elsewhere: Secondary | ICD-10-CM | POA: Diagnosis present

## 2019-09-05 DIAGNOSIS — Z87891 Personal history of nicotine dependence: Secondary | ICD-10-CM

## 2019-09-05 DIAGNOSIS — J9602 Acute respiratory failure with hypercapnia: Secondary | ICD-10-CM | POA: Diagnosis not present

## 2019-09-05 DIAGNOSIS — N189 Chronic kidney disease, unspecified: Secondary | ICD-10-CM | POA: Diagnosis not present

## 2019-09-05 DIAGNOSIS — Z96642 Presence of left artificial hip joint: Secondary | ICD-10-CM | POA: Diagnosis not present

## 2019-09-05 DIAGNOSIS — D509 Iron deficiency anemia, unspecified: Secondary | ICD-10-CM | POA: Diagnosis present

## 2019-09-05 DIAGNOSIS — E669 Obesity, unspecified: Secondary | ICD-10-CM | POA: Diagnosis present

## 2019-09-05 DIAGNOSIS — J690 Pneumonitis due to inhalation of food and vomit: Secondary | ICD-10-CM | POA: Clinically undetermined

## 2019-09-05 DIAGNOSIS — M1612 Unilateral primary osteoarthritis, left hip: Secondary | ICD-10-CM | POA: Diagnosis present

## 2019-09-05 DIAGNOSIS — D631 Anemia in chronic kidney disease: Secondary | ICD-10-CM | POA: Diagnosis present

## 2019-09-05 DIAGNOSIS — I2510 Atherosclerotic heart disease of native coronary artery without angina pectoris: Secondary | ICD-10-CM | POA: Diagnosis present

## 2019-09-05 DIAGNOSIS — R569 Unspecified convulsions: Secondary | ICD-10-CM

## 2019-09-05 DIAGNOSIS — G92 Toxic encephalopathy: Secondary | ICD-10-CM | POA: Diagnosis not present

## 2019-09-05 DIAGNOSIS — I10 Essential (primary) hypertension: Secondary | ICD-10-CM | POA: Diagnosis present

## 2019-09-05 DIAGNOSIS — Z8551 Personal history of malignant neoplasm of bladder: Secondary | ICD-10-CM

## 2019-09-05 DIAGNOSIS — N186 End stage renal disease: Secondary | ICD-10-CM

## 2019-09-05 DIAGNOSIS — G9340 Encephalopathy, unspecified: Secondary | ICD-10-CM | POA: Diagnosis not present

## 2019-09-05 DIAGNOSIS — G9341 Metabolic encephalopathy: Secondary | ICD-10-CM | POA: Diagnosis not present

## 2019-09-05 DIAGNOSIS — Z20822 Contact with and (suspected) exposure to covid-19: Secondary | ICD-10-CM | POA: Diagnosis present

## 2019-09-05 DIAGNOSIS — Z888 Allergy status to other drugs, medicaments and biological substances status: Secondary | ICD-10-CM

## 2019-09-05 DIAGNOSIS — J9601 Acute respiratory failure with hypoxia: Secondary | ICD-10-CM | POA: Diagnosis not present

## 2019-09-05 DIAGNOSIS — Z7189 Other specified counseling: Secondary | ICD-10-CM

## 2019-09-05 DIAGNOSIS — Z992 Dependence on renal dialysis: Secondary | ICD-10-CM | POA: Diagnosis not present

## 2019-09-05 DIAGNOSIS — Z833 Family history of diabetes mellitus: Secondary | ICD-10-CM

## 2019-09-05 DIAGNOSIS — Z66 Do not resuscitate: Secondary | ICD-10-CM | POA: Diagnosis present

## 2019-09-05 DIAGNOSIS — E1129 Type 2 diabetes mellitus with other diabetic kidney complication: Secondary | ICD-10-CM | POA: Diagnosis present

## 2019-09-05 DIAGNOSIS — I5032 Chronic diastolic (congestive) heart failure: Secondary | ICD-10-CM | POA: Diagnosis present

## 2019-09-05 DIAGNOSIS — I358 Other nonrheumatic aortic valve disorders: Secondary | ICD-10-CM | POA: Diagnosis present

## 2019-09-05 DIAGNOSIS — Z6829 Body mass index (BMI) 29.0-29.9, adult: Secondary | ICD-10-CM

## 2019-09-05 DIAGNOSIS — E785 Hyperlipidemia, unspecified: Secondary | ICD-10-CM | POA: Diagnosis present

## 2019-09-05 DIAGNOSIS — E1121 Type 2 diabetes mellitus with diabetic nephropathy: Secondary | ICD-10-CM | POA: Diagnosis not present

## 2019-09-05 DIAGNOSIS — Z923 Personal history of irradiation: Secondary | ICD-10-CM

## 2019-09-05 DIAGNOSIS — J9690 Respiratory failure, unspecified, unspecified whether with hypoxia or hypercapnia: Secondary | ICD-10-CM

## 2019-09-05 DIAGNOSIS — R471 Dysarthria and anarthria: Secondary | ICD-10-CM | POA: Diagnosis not present

## 2019-09-05 DIAGNOSIS — R41843 Psychomotor deficit: Secondary | ICD-10-CM | POA: Diagnosis not present

## 2019-09-05 DIAGNOSIS — Z90710 Acquired absence of both cervix and uterus: Secondary | ICD-10-CM

## 2019-09-05 DIAGNOSIS — K567 Ileus, unspecified: Secondary | ICD-10-CM | POA: Diagnosis not present

## 2019-09-05 DIAGNOSIS — Z22322 Carrier or suspected carrier of Methicillin resistant Staphylococcus aureus: Secondary | ICD-10-CM

## 2019-09-05 DIAGNOSIS — G4733 Obstructive sleep apnea (adult) (pediatric): Secondary | ICD-10-CM | POA: Diagnosis present

## 2019-09-05 DIAGNOSIS — Z885 Allergy status to narcotic agent status: Secondary | ICD-10-CM

## 2019-09-05 DIAGNOSIS — E1122 Type 2 diabetes mellitus with diabetic chronic kidney disease: Secondary | ICD-10-CM | POA: Diagnosis present

## 2019-09-05 DIAGNOSIS — R0602 Shortness of breath: Secondary | ICD-10-CM

## 2019-09-05 DIAGNOSIS — Z9911 Dependence on respirator [ventilator] status: Secondary | ICD-10-CM | POA: Diagnosis not present

## 2019-09-05 DIAGNOSIS — N185 Chronic kidney disease, stage 5: Secondary | ICD-10-CM | POA: Diagnosis not present

## 2019-09-05 DIAGNOSIS — Z79899 Other long term (current) drug therapy: Secondary | ICD-10-CM

## 2019-09-05 DIAGNOSIS — R1310 Dysphagia, unspecified: Secondary | ICD-10-CM | POA: Diagnosis not present

## 2019-09-05 DIAGNOSIS — K900 Celiac disease: Secondary | ICD-10-CM | POA: Diagnosis present

## 2019-09-05 DIAGNOSIS — R061 Stridor: Secondary | ICD-10-CM | POA: Diagnosis not present

## 2019-09-05 DIAGNOSIS — Z9221 Personal history of antineoplastic chemotherapy: Secondary | ICD-10-CM

## 2019-09-05 DIAGNOSIS — J449 Chronic obstructive pulmonary disease, unspecified: Secondary | ICD-10-CM | POA: Diagnosis present

## 2019-09-05 DIAGNOSIS — E872 Acidosis: Secondary | ICD-10-CM | POA: Diagnosis not present

## 2019-09-05 DIAGNOSIS — I959 Hypotension, unspecified: Secondary | ICD-10-CM | POA: Diagnosis not present

## 2019-09-05 DIAGNOSIS — Z01818 Encounter for other preprocedural examination: Secondary | ICD-10-CM

## 2019-09-05 DIAGNOSIS — I700 Atherosclerosis of aorta: Secondary | ICD-10-CM

## 2019-09-05 DIAGNOSIS — L89312 Pressure ulcer of right buttock, stage 2: Secondary | ICD-10-CM | POA: Diagnosis not present

## 2019-09-05 DIAGNOSIS — I132 Hypertensive heart and chronic kidney disease with heart failure and with stage 5 chronic kidney disease, or end stage renal disease: Secondary | ICD-10-CM | POA: Diagnosis present

## 2019-09-05 DIAGNOSIS — R946 Abnormal results of thyroid function studies: Secondary | ICD-10-CM | POA: Diagnosis present

## 2019-09-05 DIAGNOSIS — D696 Thrombocytopenia, unspecified: Secondary | ICD-10-CM | POA: Diagnosis not present

## 2019-09-05 DIAGNOSIS — R509 Fever, unspecified: Secondary | ICD-10-CM

## 2019-09-05 DIAGNOSIS — G40909 Epilepsy, unspecified, not intractable, without status epilepticus: Secondary | ICD-10-CM | POA: Diagnosis present

## 2019-09-05 DIAGNOSIS — Z4659 Encounter for fitting and adjustment of other gastrointestinal appliance and device: Secondary | ICD-10-CM

## 2019-09-05 DIAGNOSIS — R001 Bradycardia, unspecified: Secondary | ICD-10-CM | POA: Diagnosis not present

## 2019-09-05 HISTORY — PX: TOTAL HIP ARTHROPLASTY: SHX124

## 2019-09-05 LAB — POCT I-STAT, CHEM 8
BUN: 40 mg/dL — ABNORMAL HIGH (ref 8–23)
Calcium, Ion: 0.85 mmol/L — CL (ref 1.15–1.40)
Chloride: 108 mmol/L (ref 98–111)
Creatinine, Ser: 8.8 mg/dL — ABNORMAL HIGH (ref 0.44–1.00)
Glucose, Bld: 93 mg/dL (ref 70–99)
HCT: 44 % (ref 36.0–46.0)
Hemoglobin: 15 g/dL (ref 12.0–15.0)
Potassium: 4.2 mmol/L (ref 3.5–5.1)
Sodium: 140 mmol/L (ref 135–145)
TCO2: 21 mmol/L — ABNORMAL LOW (ref 22–32)

## 2019-09-05 LAB — CBC
HCT: 37 % (ref 36.0–46.0)
Hemoglobin: 11.6 g/dL — ABNORMAL LOW (ref 12.0–15.0)
MCH: 31.3 pg (ref 26.0–34.0)
MCHC: 31.4 g/dL (ref 30.0–36.0)
MCV: 99.7 fL (ref 80.0–100.0)
Platelets: 146 10*3/uL — ABNORMAL LOW (ref 150–400)
RBC: 3.71 MIL/uL — ABNORMAL LOW (ref 3.87–5.11)
RDW: 14.6 % (ref 11.5–15.5)
WBC: 9.8 10*3/uL (ref 4.0–10.5)
nRBC: 0 % (ref 0.0–0.2)

## 2019-09-05 LAB — GLUCOSE, CAPILLARY
Glucose-Capillary: 85 mg/dL (ref 70–99)
Glucose-Capillary: 95 mg/dL (ref 70–99)

## 2019-09-05 SURGERY — ARTHROPLASTY, HIP, TOTAL,POSTERIOR APPROACH
Anesthesia: Spinal | Site: Hip | Laterality: Left

## 2019-09-05 MED ORDER — GABAPENTIN 300 MG PO CAPS
600.0000 mg | ORAL_CAPSULE | Freq: Three times a day (TID) | ORAL | Status: DC
  Administered 2019-09-05 – 2019-09-08 (×7): 600 mg via ORAL
  Filled 2019-09-05 (×7): qty 2

## 2019-09-05 MED ORDER — GLYCOPYRROLATE 0.2 MG/ML IJ SOLN
INTRAMUSCULAR | Status: DC | PRN
  Administered 2019-09-05: .2 mg via INTRAVENOUS

## 2019-09-05 MED ORDER — FAMOTIDINE 20 MG PO TABS
ORAL_TABLET | ORAL | Status: AC
  Administered 2019-09-05: 07:00:00 20 mg via ORAL
  Filled 2019-09-05: qty 1

## 2019-09-05 MED ORDER — ONDANSETRON HCL 4 MG/2ML IJ SOLN
INTRAMUSCULAR | Status: DC | PRN
  Administered 2019-09-05: 4 mg via INTRAVENOUS

## 2019-09-05 MED ORDER — TRANEXAMIC ACID 1000 MG/10ML IV SOLN
INTRAVENOUS | Status: AC
  Filled 2019-09-05: qty 10

## 2019-09-05 MED ORDER — PROPOFOL 10 MG/ML IV BOLUS
INTRAVENOUS | Status: AC
  Filled 2019-09-05: qty 20

## 2019-09-05 MED ORDER — SODIUM CHLORIDE 0.9 % IV SOLN: INTRAVENOUS | Status: DC

## 2019-09-05 MED ORDER — NEPRO/CARBSTEADY PO LIQD
237.0000 mL | Freq: Two times a day (BID) | ORAL | Status: DC
  Administered 2019-09-05: 16:00:00 237 mL via ORAL

## 2019-09-05 MED ORDER — OXYCODONE HCL 5 MG/5ML PO SOLN: 5.0000 mg | Freq: Once | ORAL | Status: AC | PRN

## 2019-09-05 MED ORDER — ONDANSETRON HCL 4 MG/2ML IJ SOLN: 4.0000 mg | Freq: Once | INTRAMUSCULAR | Status: DC | PRN

## 2019-09-05 MED ORDER — HYDROCODONE-ACETAMINOPHEN 5-325 MG PO TABS
1.0000 | ORAL_TABLET | ORAL | Status: DC | PRN
  Administered 2019-09-05: 16:00:00 1 via ORAL
  Filled 2019-09-05: qty 1

## 2019-09-05 MED ORDER — CEFAZOLIN SODIUM-DEXTROSE 2-4 GM/100ML-% IV SOLN
2.0000 g | Freq: Four times a day (QID) | INTRAVENOUS | Status: AC
  Administered 2019-09-05 – 2019-09-06 (×2): 2 g via INTRAVENOUS
  Filled 2019-09-05 (×2): qty 100

## 2019-09-05 MED ORDER — LOPERAMIDE HCL 1 MG/7.5ML PO SUSP
2.0000 mg | ORAL | Status: DC | PRN
  Filled 2019-09-05 (×2): qty 15

## 2019-09-05 MED ORDER — FENTANYL CITRATE (PF) 100 MCG/2ML IJ SOLN
INTRAMUSCULAR | Status: AC
  Administered 2019-09-05: 25 ug via INTRAVENOUS
  Filled 2019-09-05: qty 2

## 2019-09-05 MED ORDER — ATORVASTATIN CALCIUM 20 MG PO TABS
40.0000 mg | ORAL_TABLET | Freq: Every evening | ORAL | Status: DC
  Administered 2019-09-05 – 2019-09-12 (×6): 40 mg via ORAL
  Filled 2019-09-05 (×8): qty 2

## 2019-09-05 MED ORDER — BUPIVACAINE LIPOSOME 1.3 % IJ SUSP
INTRAMUSCULAR | Status: AC
  Filled 2019-09-05: qty 20

## 2019-09-05 MED ORDER — SODIUM CHLORIDE FLUSH 0.9 % IV SOLN
INTRAVENOUS | Status: AC
  Filled 2019-09-05: qty 10

## 2019-09-05 MED ORDER — PROPOFOL 500 MG/50ML IV EMUL
INTRAVENOUS | Status: DC | PRN
  Administered 2019-09-05: 20 ug/kg/min via INTRAVENOUS

## 2019-09-05 MED ORDER — RENA-VITE PO TABS
1.0000 | ORAL_TABLET | Freq: Every day | ORAL | Status: DC
  Administered 2019-09-05: 1 via ORAL
  Filled 2019-09-05: qty 1

## 2019-09-05 MED ORDER — DOCUSATE SODIUM 100 MG PO CAPS
100.0000 mg | ORAL_CAPSULE | Freq: Two times a day (BID) | ORAL | Status: DC
  Administered 2019-09-05 – 2019-09-07 (×3): 100 mg via ORAL
  Filled 2019-09-05 (×5): qty 1

## 2019-09-05 MED ORDER — FENTANYL CITRATE (PF) 100 MCG/2ML IJ SOLN: 25.0000 ug | INTRAMUSCULAR | Status: DC | PRN

## 2019-09-05 MED ORDER — OXYBUTYNIN CHLORIDE 5 MG PO TABS
5.0000 mg | ORAL_TABLET | Freq: Three times a day (TID) | ORAL | Status: DC
  Administered 2019-09-05 – 2019-09-10 (×12): 5 mg via ORAL
  Filled 2019-09-05 (×16): qty 1

## 2019-09-05 MED ORDER — TRAMADOL HCL 50 MG PO TABS: 50.0000 mg | ORAL_TABLET | Freq: Four times a day (QID) | ORAL | Status: DC | PRN

## 2019-09-05 MED ORDER — PROPOFOL 500 MG/50ML IV EMUL
INTRAVENOUS | Status: AC
  Filled 2019-09-05: qty 50

## 2019-09-05 MED ORDER — BUPIVACAINE-EPINEPHRINE (PF) 0.5% -1:200000 IJ SOLN
INTRAMUSCULAR | Status: AC
  Filled 2019-09-05: qty 30

## 2019-09-05 MED ORDER — ONDANSETRON HCL 4 MG PO TABS: 4.0000 mg | ORAL_TABLET | Freq: Four times a day (QID) | ORAL | Status: DC | PRN

## 2019-09-05 MED ORDER — ACETAMINOPHEN 10 MG/ML IV SOLN
INTRAVENOUS | Status: AC
  Filled 2019-09-05: qty 100

## 2019-09-05 MED ORDER — SODIUM CHLORIDE 0.9 % IV SOLN
INTRAVENOUS | Status: DC
  Administered 2019-09-05: 1000 mL via INTRAVENOUS

## 2019-09-05 MED ORDER — HYDRALAZINE HCL 25 MG PO TABS: 25.0000 mg | ORAL_TABLET | Freq: Three times a day (TID) | ORAL | Status: DC | PRN

## 2019-09-05 MED ORDER — ENOXAPARIN SODIUM 30 MG/0.3ML ~~LOC~~ SOLN: 30.0000 mg | SUBCUTANEOUS | Status: DC

## 2019-09-05 MED ORDER — PHENYLEPHRINE HCL-NACL 20-0.9 MG/250ML-% IV SOLN
INTRAVENOUS | Status: DC | PRN
  Administered 2019-09-05: 20 ug/min via INTRAVENOUS

## 2019-09-05 MED ORDER — CINACALCET HCL 30 MG PO TABS
30.0000 mg | ORAL_TABLET | Freq: Every day | ORAL | Status: DC
  Administered 2019-09-05 – 2019-09-08 (×3): 30 mg via ORAL
  Filled 2019-09-05 (×5): qty 1

## 2019-09-05 MED ORDER — EPHEDRINE SULFATE 50 MG/ML IJ SOLN
INTRAMUSCULAR | Status: AC
  Filled 2019-09-05: qty 1

## 2019-09-05 MED ORDER — PANTOPRAZOLE SODIUM 40 MG PO TBEC
40.0000 mg | DELAYED_RELEASE_TABLET | Freq: Every day | ORAL | Status: DC
  Administered 2019-09-05 – 2019-09-07 (×2): 40 mg via ORAL
  Filled 2019-09-05 (×2): qty 1

## 2019-09-05 MED ORDER — FLEET ENEMA 7-19 GM/118ML RE ENEM: 1.0000 | ENEMA | Freq: Once | RECTAL | Status: DC | PRN

## 2019-09-05 MED ORDER — METOCLOPRAMIDE HCL 10 MG PO TABS
5.0000 mg | ORAL_TABLET | Freq: Three times a day (TID) | ORAL | Status: DC | PRN
  Filled 2019-09-05: qty 1

## 2019-09-05 MED ORDER — TRANEXAMIC ACID 1000 MG/10ML IV SOLN
INTRAVENOUS | Status: DC | PRN
  Administered 2019-09-05: 10:00:00 1000 mg via TOPICAL

## 2019-09-05 MED ORDER — SODIUM CHLORIDE FLUSH 0.9 % IV SOLN
INTRAVENOUS | Status: AC
  Filled 2019-09-05: qty 40

## 2019-09-05 MED ORDER — ACETAMINOPHEN 500 MG PO TABS
500.0000 mg | ORAL_TABLET | Freq: Four times a day (QID) | ORAL | Status: AC
  Administered 2019-09-05 – 2019-09-06 (×2): 500 mg via ORAL
  Filled 2019-09-05 (×3): qty 1

## 2019-09-05 MED ORDER — ASCORBIC ACID 500 MG PO TABS
500.0000 mg | ORAL_TABLET | Freq: Two times a day (BID) | ORAL | Status: DC
  Administered 2019-09-05 – 2019-09-12 (×12): 500 mg via ORAL
  Filled 2019-09-05 (×13): qty 1

## 2019-09-05 MED ORDER — VITAMIN D 25 MCG (1000 UNIT) PO TABS
1000.0000 [IU] | ORAL_TABLET | Freq: Every day | ORAL | Status: DC
  Administered 2019-09-07 – 2019-09-12 (×6): 1000 [IU] via ORAL
  Filled 2019-09-05 (×6): qty 1

## 2019-09-05 MED ORDER — FENTANYL CITRATE (PF) 100 MCG/2ML IJ SOLN
INTRAMUSCULAR | Status: AC
  Filled 2019-09-05: qty 2

## 2019-09-05 MED ORDER — ACETAMINOPHEN 10 MG/ML IV SOLN: 1000.0000 mg | Freq: Once | INTRAVENOUS | Status: DC | PRN

## 2019-09-05 MED ORDER — PROPOFOL 10 MG/ML IV BOLUS
INTRAVENOUS | Status: DC | PRN
  Administered 2019-09-05: 10 mg via INTRAVENOUS
  Administered 2019-09-05 (×2): 20 mg via INTRAVENOUS

## 2019-09-05 MED ORDER — ONDANSETRON HCL 4 MG/2ML IJ SOLN: 4.0000 mg | Freq: Four times a day (QID) | INTRAMUSCULAR | Status: DC | PRN

## 2019-09-05 MED ORDER — METOCLOPRAMIDE HCL 5 MG/ML IJ SOLN: 5.0000 mg | Freq: Three times a day (TID) | INTRAMUSCULAR | Status: DC | PRN

## 2019-09-05 MED ORDER — CEFAZOLIN SODIUM-DEXTROSE 2-4 GM/100ML-% IV SOLN
INTRAVENOUS | Status: AC
  Administered 2019-09-05: 2 g via INTRAVENOUS
  Filled 2019-09-05: qty 100

## 2019-09-05 MED ORDER — SODIUM CHLORIDE 0.9 % IV SOLN
INTRAVENOUS | Status: DC | PRN
  Administered 2019-09-05: 10:00:00 60 mL

## 2019-09-05 MED ORDER — EPHEDRINE SULFATE-NACL 50-0.9 MG/10ML-% IV SOSY
PREFILLED_SYRINGE | INTRAVENOUS | Status: DC | PRN
  Administered 2019-09-05 (×2): 10 mg via INTRAVENOUS

## 2019-09-05 MED ORDER — CHLORHEXIDINE GLUCONATE 4 % EX LIQD: 60.0000 mL | Freq: Once | CUTANEOUS | Status: DC

## 2019-09-05 MED ORDER — CEFAZOLIN SODIUM-DEXTROSE 2-4 GM/100ML-% IV SOLN: 2.0000 g | Freq: Once | INTRAVENOUS | Status: DC

## 2019-09-05 MED ORDER — SEVOFLURANE IN SOLN
RESPIRATORY_TRACT | Status: AC
  Filled 2019-09-05: qty 250

## 2019-09-05 MED ORDER — MAGNESIUM HYDROXIDE 400 MG/5ML PO SUSP: 30.0000 mL | Freq: Every day | ORAL | Status: DC | PRN

## 2019-09-05 MED ORDER — BUPIVACAINE-EPINEPHRINE (PF) 0.5% -1:200000 IJ SOLN
INTRAMUSCULAR | Status: DC | PRN
  Administered 2019-09-05: 30 mL

## 2019-09-05 MED ORDER — CEFAZOLIN SODIUM-DEXTROSE 2-4 GM/100ML-% IV SOLN
2.0000 g | INTRAVENOUS | Status: AC
  Administered 2019-09-05: 08:00:00 2 g via INTRAVENOUS

## 2019-09-05 MED ORDER — MORPHINE SULFATE (PF) 2 MG/ML IV SOLN: 1.0000 mg | INTRAVENOUS | Status: DC | PRN

## 2019-09-05 MED ORDER — FERROUS SULFATE 325 (65 FE) MG PO TABS
325.0000 mg | ORAL_TABLET | Freq: Every day | ORAL | Status: DC
  Administered 2019-09-07 – 2019-09-12 (×5): 325 mg via ORAL
  Filled 2019-09-05 (×6): qty 1

## 2019-09-05 MED ORDER — ALBUTEROL SULFATE (2.5 MG/3ML) 0.083% IN NEBU: 2.5000 mg | INHALATION_SOLUTION | Freq: Four times a day (QID) | RESPIRATORY_TRACT | Status: DC | PRN

## 2019-09-05 MED ORDER — BUPIVACAINE HCL (PF) 0.5 % IJ SOLN
INTRAMUSCULAR | Status: DC | PRN
  Administered 2019-09-05: 3 mL via INTRATHECAL

## 2019-09-05 MED ORDER — CALCIUM ACETATE (PHOS BINDER) 667 MG PO CAPS
1334.0000 mg | ORAL_CAPSULE | Freq: Three times a day (TID) | ORAL | Status: DC
  Administered 2019-09-05 – 2019-09-08 (×6): 1334 mg via ORAL
  Filled 2019-09-05 (×13): qty 2

## 2019-09-05 MED ORDER — ACETAMINOPHEN 325 MG PO TABS
325.0000 mg | ORAL_TABLET | Freq: Four times a day (QID) | ORAL | Status: DC | PRN
  Administered 2019-09-07: 650 mg via ORAL
  Administered 2019-09-20: 325 mg via ORAL
  Filled 2019-09-05 (×2): qty 2

## 2019-09-05 MED ORDER — OXYCODONE HCL 5 MG PO TABS
5.0000 mg | ORAL_TABLET | Freq: Once | ORAL | Status: AC | PRN
  Administered 2019-09-05: 5 mg via ORAL

## 2019-09-05 MED ORDER — CEFAZOLIN SODIUM-DEXTROSE 2-4 GM/100ML-% IV SOLN
INTRAVENOUS | Status: AC
  Filled 2019-09-05: qty 100

## 2019-09-05 MED ORDER — BISACODYL 10 MG RE SUPP: 10.0000 mg | Freq: Every day | RECTAL | Status: DC | PRN

## 2019-09-05 MED ORDER — DEXMEDETOMIDINE HCL IN NACL 80 MCG/20ML IV SOLN
INTRAVENOUS | Status: AC
  Filled 2019-09-05: qty 20

## 2019-09-05 MED ORDER — BUPIVACAINE HCL (PF) 0.5 % IJ SOLN
INTRAMUSCULAR | Status: AC
  Filled 2019-09-05: qty 30

## 2019-09-05 MED ORDER — OXYCODONE HCL 5 MG PO TABS
ORAL_TABLET | ORAL | Status: AC
  Filled 2019-09-05: qty 1

## 2019-09-05 MED ORDER — MIDAZOLAM HCL 2 MG/2ML IJ SOLN
INTRAMUSCULAR | Status: AC
  Filled 2019-09-05: qty 2

## 2019-09-05 MED ORDER — PHENYLEPHRINE HCL (PRESSORS) 10 MG/ML IV SOLN
INTRAVENOUS | Status: AC
  Filled 2019-09-05: qty 1

## 2019-09-05 MED ORDER — DIPHENHYDRAMINE HCL 12.5 MG/5ML PO ELIX
12.5000 mg | ORAL_SOLUTION | ORAL | Status: DC | PRN
  Filled 2019-09-05: qty 10

## 2019-09-05 MED ORDER — PHENYLEPHRINE HCL (PRESSORS) 10 MG/ML IV SOLN
INTRAVENOUS | Status: DC | PRN
  Administered 2019-09-05: 200 ug via INTRAVENOUS
  Administered 2019-09-05: 100 ug via INTRAVENOUS
  Administered 2019-09-05 (×2): 200 ug via INTRAVENOUS

## 2019-09-05 MED ORDER — FAMOTIDINE 20 MG PO TABS: 20.0000 mg | ORAL_TABLET | Freq: Once | ORAL | Status: AC

## 2019-09-05 MED ORDER — CALCIUM CARBONATE ANTACID 500 MG PO CHEW
1.0000 | CHEWABLE_TABLET | Freq: Every day | ORAL | Status: DC
  Administered 2019-09-07 – 2019-09-09 (×2): 200 mg via ORAL
  Filled 2019-09-05 (×3): qty 1

## 2019-09-05 SURGICAL SUPPLY — 54 items
BLADE SAGITTAL WIDE XTHICK NO (BLADE) ×3 IMPLANT
BLADE SURG SZ20 CARB STEEL (BLADE) ×3 IMPLANT
CANISTER SUCT 1200ML W/VALVE (MISCELLANEOUS) ×3 IMPLANT
CANISTER SUCT 3000ML PPV (MISCELLANEOUS) ×6 IMPLANT
CHLORAPREP W/TINT 26 (MISCELLANEOUS) ×3 IMPLANT
COVER WAND RF STERILE (DRAPES) ×3 IMPLANT
DRAPE 3/4 80X56 (DRAPES) ×3 IMPLANT
DRAPE INCISE IOBAN 66X60 STRL (DRAPES) ×3 IMPLANT
DRAPE SURG 17X11 SM STRL (DRAPES) ×6 IMPLANT
DRSG OPSITE POSTOP 4X10 (GAUZE/BANDAGES/DRESSINGS) ×3 IMPLANT
ELECT BLADE 6.5 EXT (BLADE) ×6 IMPLANT
ELECT CAUTERY BLADE 6.4 (BLADE) ×3 IMPLANT
GLOVE BIO SURGEON STRL SZ7.5 (GLOVE) ×12 IMPLANT
GLOVE BIO SURGEON STRL SZ8 (GLOVE) ×15 IMPLANT
GLOVE BIOGEL PI IND STRL 8 (GLOVE) ×2 IMPLANT
GLOVE BIOGEL PI INDICATOR 8 (GLOVE) ×4
GLOVE INDICATOR 8.0 STRL GRN (GLOVE) ×6 IMPLANT
GOWN STRL REUS W/ TWL LRG LVL3 (GOWN DISPOSABLE) ×3 IMPLANT
GOWN STRL REUS W/ TWL XL LVL3 (GOWN DISPOSABLE) ×2 IMPLANT
GOWN STRL REUS W/TWL LRG LVL3 (GOWN DISPOSABLE) ×6
GOWN STRL REUS W/TWL XL LVL3 (GOWN DISPOSABLE) ×4
HEAD CERAMIC BIOLOX 36 T1 STD (Head) ×3 IMPLANT
HOOD PEEL AWAY FLYTE STAYCOOL (MISCELLANEOUS) ×15 IMPLANT
KIT TURNOVER KIT A (KITS) ×3 IMPLANT
LINER ACE G7 36 SZF HIGH WALL (Liner) ×3 IMPLANT
NDL SAFETY ECLIPSE 18X1.5 (NEEDLE) ×2 IMPLANT
NEEDLE FILTER BLUNT 18X 1/2SAF (NEEDLE) ×2
NEEDLE FILTER BLUNT 18X1 1/2 (NEEDLE) ×1 IMPLANT
NEEDLE HYPO 18GX1.5 SHARP (NEEDLE) ×4
NEEDLE SPNL 20GX3.5 QUINCKE YW (NEEDLE) ×3 IMPLANT
PACK HIP PROSTHESIS (MISCELLANEOUS) ×3 IMPLANT
PENCIL SMOKE EVACUATOR COATED (MISCELLANEOUS) ×3 IMPLANT
PILLOW ABDUCTION FOAM SM (MISCELLANEOUS) ×3 IMPLANT
PIN STEINMAN 3/16 (PIN) ×3 IMPLANT
PIN STEINMANN 3/16X9 BAY 6PK (Pin) ×1 IMPLANT
PULSAVAC PLUS IRRIG FAN TIP (DISPOSABLE) ×3
SHELL ACETABULAR 3H 54MM F (Shell) ×3 IMPLANT
SOL .9 NS 3000ML IRR  AL (IV SOLUTION) ×2
SOL .9 NS 3000ML IRR UROMATIC (IV SOLUTION) ×1 IMPLANT
SPONGE LAP 18X18 RF (DISPOSABLE) IMPLANT
ST PIN 3/16X9 BAY 6PK (Pin) ×3 IMPLANT
STAPLER SKIN PROX 35W (STAPLE) ×3 IMPLANT
STEM FEMORAL SZ12 140MM (Stem) ×3 IMPLANT
SUT TICRON 2-0 30IN 311381 (SUTURE) ×9 IMPLANT
SUT VIC AB 0 CT1 36 (SUTURE) ×3 IMPLANT
SUT VIC AB 1 CT1 36 (SUTURE) ×3 IMPLANT
SUT VIC AB 2-0 CT1 (SUTURE) ×9 IMPLANT
SYR 10ML LL (SYRINGE) ×3 IMPLANT
SYR 20ML LL LF (SYRINGE) ×3 IMPLANT
SYR 30ML LL (SYRINGE) ×6 IMPLANT
TAPE TRANSPORE STRL 2 31045 (GAUZE/BANDAGES/DRESSINGS) ×3 IMPLANT
TIP FAN IRRIG PULSAVAC PLUS (DISPOSABLE) ×1 IMPLANT
TRAY FOLEY MTR SLVR 16FR STAT (SET/KITS/TRAYS/PACK) ×3 IMPLANT
WATER STERILE IRR 1000ML POUR (IV SOLUTION) ×3 IMPLANT

## 2019-09-05 NOTE — Evaluation (Addendum)
Physical Therapy Evaluation Patient Details Name: Barbara [name] MRN: [id number] DOB: 01/16/1941 Today's Date: 09/05/2019   History of Present Illness  Pt is 79 yo female s/p L THA, posterior approach, WBAT. PMh includes asthma, sleep apnea, COPD, HTN, CHF, DMII, CKDIV,bladder cancer.    Clinical Impression  Pt alert, complained of L hip pain, 10/10, RN had just administered pain medication. Per pt and family, pt utilized [facility] as needed and "furniture walked" in home. Husband built a ramp prior to hospital admission, does not have a RW at home.  Session significantly limited due to patient pain. modA to roll to R to allow RN to check skin, and repositioned in sidelying with pillows and adherence to posterior hip precautions. PT and pt reviewed precautions, will need reinforcement. Pt performed isometric exercises with pt verbal/tactile cues, attempted to perform supine to sit, pt unable to tolerate due to pain.  Overall the patient demonstrated deficits (see "PT Problem List") that impede the patient's functional abilities, safety, and mobility and would benefit from skilled PT intervention. Recommendation is SNF pending pt progress.      Follow Up Recommendations SNF    Equipment Recommendations  Rolling walker with 5" wheels    Recommendations for Other Services OT consult     Precautions / Restrictions Precautions Precautions: Posterior Hip Precaution Booklet Issued: Yes (comment) Restrictions Weight Bearing Restrictions: Yes LLE Weight Bearing: Weight bearing as tolerated      Mobility  Bed Mobility Overal bed mobility: Needs Assistance Bed Mobility: Rolling Rolling: Mod assist         General bed mobility comments: use of bed rails, deferred sitting up to side of bed due to significantly increased pain  Transfers                 General transfer comment: deferred  Ambulation/Gait                Stairs            Wheelchair Mobility     Modified Rankin (Stroke Patients Only)       Balance                                             Pertinent Vitals/Pain Pain Assessment: 0-10 Pain Score: 10-Worst pain ever Pain Location: L hip Pain Descriptors / Indicators: Grimacing;Moaning;Tender Pain Intervention(s): Limited activity within patient's tolerance;Monitored during session;Repositioned;Premedicated before session;Ice applied    Home Living Family/patient expects to be discharged to:: Private residence Living Arrangements: Spouse/significant other Available Help at Discharge: Available 24 hours/day Type of Home: House Home Access: Ramped entrance     Home Layout: One level Home Equipment: Toilet riser;Bedside commode;Walker - 4 wheels;Shower seat      Prior Function Level of Independence: Independent with assistive device(s)         Comments: Pt husband reported pt uses furniture in house and [facility] for ambulation.     Hand Dominance   Dominant Hand: Right    Extremity/Trunk Assessment   Upper Extremity Assessment Upper Extremity Assessment: Overall WFL for tasks assessed    Lower Extremity Assessment Lower Extremity Assessment: RLE deficits/detail;LLE deficits/detail RLE Deficits / Details: difficult to assess due to LLE pain LLE Deficits / Details: difficult to assess due to LLE pain       Communication   Communication: Other (comment)(garbled speech)  Cognition Arousal/Alertness: Awake/alert Behavior  During Therapy: WFL for tasks assessed/performed Overall Cognitive Status: Within Functional Limits for tasks assessed                                        General Comments      Exercises Total Joint Exercises Ankle Circles/Pumps: AROM;Both;10 reps Gluteal Sets: AROM;Both;10 reps Towel Squeeze: AROM;Both;10 reps   Assessment/Plan    PT Assessment Patient needs continued PT services  PT Problem List Decreased strength;Decreased  mobility;Decreased range of motion;Decreased knowledge of precautions;Decreased activity tolerance;Decreased balance;Decreased knowledge of use of DME;Pain       PT Treatment Interventions DME instruction;Therapeutic exercise;Gait training;Balance training;Functional mobility training;Neuromuscular re-education;Therapeutic activities;Patient/family education    PT Goals (Current goals can be found in the Care Plan section)  Acute Rehab PT Goals Patient Stated Goal: to decrease pain PT Goal Formulation: With patient Time For Goal Achievement: 09/19/19 Potential to Achieve Goals: Good    Frequency BID   Barriers to discharge        Co-evaluation               AM-PAC PT "6 Clicks" Mobility  Outcome Measure Help needed turning from your back to your side while in a flat bed without using bedrails?: A Lot Help needed moving from lying on your back to sitting on the side of a flat bed without using bedrails?: A Lot Help needed moving to and from a bed to a chair (including a wheelchair)?: A Lot Help needed standing up from a chair using your arms (e.g., wheelchair or bedside chair)?: Total Help needed to walk in hospital room?: Total Help needed climbing 3-5 steps with a railing? : Total 6 Click Score: 9    End of Session Equipment Utilized During Treatment: Oxygen(2L) Activity Tolerance: Patient limited by pain Patient left: in bed;with call bell/phone within reach;with SCD's reapplied;with family/visitor present Nurse Communication: Mobility status PT Visit Diagnosis: Muscle weakness (generalized) (M62.81);Difficulty in walking, not elsewhere classified (R26.2);Other abnormalities of gait and mobility (R26.89);Pain Pain - Right/Left: Left Pain - part of body: Hip    Time: 2355-7322 PT Time Calculation (min) (ACUTE ONLY): 24 min   Charges:   PT Evaluation $PT Eval Low Complexity: 1 Low PT Treatments $Therapeutic Exercise: 8-22 mins        Lieutenant Diego PT,  DPT 4:31 PM,09/05/19

## 2019-09-05 NOTE — Op Note (Signed)
09/05/2019  10:24 AM  Patient:   Barbara [name]  Pre-Op Diagnosis:   Degenerative joint disease, left hip.  Post-Op Diagnosis:   Same.  Procedure:   Left total hip arthroplasty.  Surgeon:   Pascal Lux, MD  Assistant:   Cameron Proud, PA-C; Jeralyn Bennett, PA-S  Anesthesia:   Spinal  Findings:   As above.  Complications:   None  EBL:   200 cc  Fluids:   300 cc crystalloid  UOP:   None  TT:   None  Drains:   None  Closure:   Staples  Implants:   Biomet press-fit system with a #12 laterally offset Echo femoral stem, a 54 mm acetabular shell with an E-poly hi-wall liner, and a 36 mm ceramic head with a +0 mm neck.  Brief Clinical Note:   The patient is a 79 year old female with a long history of progressively worsening left hip pain. Her symptoms have progressed despite medications, activity modification, etc. Her history and examination are consistent with [place] generative joint disease, confirmed by plain radiographs. The patient presents at this time for a left total hip arthroplasty.   Procedure:   The patient was brought into the operating room. After adequate spinal anesthesia was obtained, the patient was repositioned in the right lateral decubitus position and secured using a lateral hip positioner. The left hip and lower extremity were prepped with ChloroPrep solution before being draped sterilely. Preoperative antibiotics were administered. A timeout was performed to verify the appropriate surgical site before a standard posterior approach to the hip was made through an approximately 4-5 inch incision. The incision was carried down through the subcutaneous tissues to expose the gluteal fascia and proximal end of the iliotibial band. These structures were split the length of the incision and the Charnley self-retaining hip retractor placed. The bursal tissues were swept posteriorly to expose the short external rotators. The anterior border of the piriformis tendon  was identified and this plane developed down through the capsule to enter the joint. A flap of tissue was elevated off the posterior aspect of the femoral neck and greater trochanter and retracted posteriorly. This flap included the piriformis tendon, the short external rotators, and the posterior capsule. The soft tissues were elevated off the lateral aspect of the ilium and a large Steinmann pin placed bicortically. With the left leg aligned over the right, a drill bit was placed into the greater trochanter parallel to the Steinmann pin and the distance between these two pins measured in order to optimize leg lengths postoperatively. The drill bit was removed and the hip dislocated. The piriformis fossa was debrided of soft tissues before the intramedullary canal was accessed through this point using a triple step reamer. The canal was reamed sequentially beginning with a #7 tapered reamer and progressing to a #12 tapered reamer. This provided excellent circumferential chatter. Using the appropriate guide, a femoral neck cut was made 10-12 mm above the lesser trochanter. The femoral head was removed.  Attention was directed to the acetabular side. The labrum was debrided circumferentially before the ligamentum teres was removed using a large curette. A line was drawn on the drapes corresponding to the native version of the acetabulum. This line was used as a guide while the acetabulum was reamed sequentially beginning with a 43 mm reamer and progressing to a 53 mm reamer. This provided excellent circumferential chatter. The 53 mm trial acetabulum was positioned and found to fit quite well. Therefore, the 54 mm acetabular  shell was selected and impacted into place with care taken to maintain the appropriate version. The trial high wall liner was inserted.  Attention was redirected to the femoral side. A box osteotome was used to establish version before the canal was broached sequentially beginning with a #7  broach and progressing to a #12 broach. This was left in place and several trial reductions performed using both a standard and laterally offset neck options, as well as the -6 mm and +0 mm neck lengths. The permanent E-polyethylene hi-wall liner was impacted into the acetabular shell and its locking mechanism verified using a quarter-inch osteotome. Next, the #12 lateral offset femoral stem was impacted into place with care taken to maintain the appropriate version. A repeat trial reduction was performed using the +0 mm and -3 mm neck lengths. The +0 mm neck length demonstrated excellent stability both in extension and external rotation as well as with flexion to 90 and internal rotation beyond 70. It also was stable in the position of sleep. In addition, leg lengths appeared to be restored appropriately, both by reassessing the position of the right leg over the left, as well as by measuring the distance between the Steinmann pin and the drill bit. The 36 mm ceramic head with the +0 mm neck adapter was impacted onto the stem of the femoral component. The Morse taper locking mechanism was verified using manual distraction before the head was relocated and placed through a range of motion with the findings as described above.  The wound was copiously irrigated with sterile saline solution via the jet lavage system before the peri-incisional and pericapsular tissues were injected with 30 cc of 0.5% Sensorcaine with epinephrine and 20 cc of Exparel diluted out to 60 cc with normal saline to help with postoperative analgesia. The posterior flap was reapproximated to the posterior aspect of the greater trochanter using #2 Tycron interrupted sutures placed through drill holes. Several additional #2 Tycron interrupted sutures were used to reinforce this layer of closure. The iliotibial band was reapproximated using #1 Vicryl interrupted sutures before the gluteal fascia was closed using a running #1 Vicryl suture. At  this point, 1 g of transexemic acid in 10 cc of normal saline was injected into the joint to help reduce postoperative bleeding. The subcutaneous tissues were closed in several layers using 2-0 Vicryl interrupted sutures before the skin was closed using staples. A sterile occlusive dressing was applied to the wound before the patient was placed into an abduction wedge pillow. The patient was then rolled back into the supine position on his/her hospital bed before being awakened and returned to the recovery room in satisfactory condition after tolerating the procedure well.

## 2019-09-05 NOTE — OR Nursing (Signed)
Dr. Bertell Maria, anesthesia at bedside  aware of existing line 24 g to RT thumb

## 2019-09-05 NOTE — Anesthesia Preprocedure Evaluation (Signed)
Anesthesia Evaluation  Patient identified by MRN, date of birth, ID band Patient awake    Reviewed: Allergy & Precautions, NPO status , Patient's Chart, lab work & pertinent test results  History of Anesthesia Complications Negative for: history of anesthetic complications  Airway Mallampati: IV  TM Distance: >3 FB Neck ROM: Full    Dental no notable dental hx. (+) Edentulous Upper   Pulmonary shortness of breath, asthma , sleep apnea, Continuous Positive Airway Pressure Ventilation and Oxygen sleep apnea , COPD,  oxygen dependent, Patient abstained from smoking.Not current smoker, former smoker,  2L O2 [place] at night   Pulmonary exam normal breath sounds clear to auscultation       Cardiovascular Exercise Tolerance: Poor METShypertension, Pt. on medications + CAD and +CHF  (-) Past MI (-) dysrhythmias  Rhythm:Regular Rate:Normal - Systolic murmurs Patient unclear about cardiac history. Denies stents/MI, says maybe she has CHF. No echo in system but note from physician says "echo is stable"   Neuro/Psych negative neurological ROS  negative psych ROS   GI/Hepatic GERD  ,(+)     (-) substance abuse  ,   Endo/Other  diabetes, Type 2  Renal/GU ESRF and DialysisRenal diseasenegative Renal ROSLast dialyzed yesterday     Musculoskeletal  (+) Arthritis ,   Abdominal   Peds  Hematology   Anesthesia Other Findings Past Medical History: No date: Adult celiac disease 2017: Anemia associated with chronic renal failure     Comment:  blood transfusion last week 10/17 No date: Arthritis No date: Asthma 2017: Cancer ([place])     Comment:  bladder No date: Chronic kidney disease No date: CKD (chronic kidney disease)     Comment:  stage IV kidney disease.  dr. Candiss Norse and dr. Holley Raring               follow her No date: COPD (chronic obstructive pulmonary disease) ([place]) No date: Coronary artery disease No date: Diabetes mellitus  without complication ([place]) No date: Dialysis patient [facility])     Comment:  Tues, Thurs, Sat No date: Dyspnea     Comment:  with exertion No date: Elevated lipids No date: GERD (gastroesophageal reflux disease) No date: Hematuria No date: Hypertension No date: Lower back pain No date: Neuropathy No date: Oxygen dependent     Comment:  at hs 2017: Personal history of chemotherapy     Comment:  bladder ca 2017: Personal history of radiation therapy     Comment:  bladder ca No date: Sleep apnea     Comment:  uses cpap 01/2016: Urinary obstruction  Reproductive/Obstetrics                             Anesthesia Physical Anesthesia Plan  ASA: IV  Anesthesia Plan: Spinal   Post-op Pain Management:    Induction: Intravenous  PONV Risk Score and Plan: 3 and Ondansetron, Dexamethasone and Propofol infusion  Airway Management Planned: Natural Airway  Additional Equipment:   Intra-op Plan:   Post-operative Plan:   Informed Consent: I have reviewed the patients History and Physical, chart, labs and discussed the procedure including the risks, benefits and alternatives for the proposed anesthesia with the patient or authorized representative who has indicated his/her understanding and acceptance.       Plan Discussed with: CRNA and Surgeon  Anesthesia Plan Comments: (Discussed R/B/A of neuraxial anesthesia technique with patient: - rare risks of spinal/epidural hematoma, nerve damage, infection - Risk of PDPH -  Risk of nausea and vomiting - Risk of conversion to general anesthesia and its associated risks, including sore throat, damage to lips/teeth/oropharynx, and rare risks such as cardiac and respiratory events.  Patient voiced understanding.)        Anesthesia Quick Evaluation

## 2019-09-05 NOTE — Consult Note (Signed)
Medical Consultation   Barbara [name]  ASN:[id number]  DOB: 1940/10/31  DOA: 09/05/2019  PCP: [name], MD  Outpatient Specialists:   Requesting physician: Dr. Kizzie Bane  Reason for consultation: - for management of her chronic medical issues  History of Present Illness: Barbara [name] is an 79 y.o. female with medical history significant of hypertension, hyperlipidemia, diet-controlled diabetes, COPD, asthma, GERD, ESRD-HD (MWF), OSA, CAD, bladder cancer, iron deficiency anemia, pt is admitted by Dr. Kizzie Bane for s/p of left total hip arthroplasty. We are asked to consult on this pt for management of chronic medical issues.  Pt underwent left total hip arthroplasty today.  She has pain in the left hip.  Denies chest pain, shortness breath, cough, fever or chills.  No nausea, vomiting, diarrhea, abdominal pain, symptoms of UTI or unilateral weakness.  Patient had dialysis yesterday.  ED Course: pt was found to have blood pressure 87/51 after surgery, which improved to 132/60, heart rate 95, RR 16, oxygen saturation 90 to 200% on room air, temperature normal.  Potassium 442, bicarbonate 21, creatinine 8.80, BUN 40.  WBC 9.8, hemoglobin stable, 11.6. Patient had negative Covid PCR on 09/01/2019.   Review of Systems:  General: no fevers, chills, no changes in body weight, no changes in appetite Skin: no rash HEENT: no blurry vision, hearing changes or sore throat Pulm: no dyspnea, coughing, wheezing CV: no chest pain, palpitations, shortness of breath Abd: no nausea/vomiting, abdominal pain, diarrhea/constipation GU: no dysuria, hematuria, polyuria Ext: has left hip pain  Neuro: no weakness, numbness, or tingling  Past Medical History: Past Medical History:  Diagnosis Date  . Adult celiac disease   . Anemia associated with chronic renal failure 2017   blood transfusion last week 10/17  . Arthritis   . Asthma   . Cancer ([place]) 2017   bladder  . Chronic kidney disease    . 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  dr. Candiss Norse and dr. Holley Raring follow her  . COPD (chronic obstructive pulmonary disease) ([place])   . Coronary artery disease   . Diabetes mellitus without complication ([place])   . Dialysis patient ([place])    Tues, Thurs, Sat  . Dyspnea    with exertion  . Elevated lipids   . GERD (gastroesophageal reflux disease)   . Hematuria   . Hypertension   . Lower back pain   . Neuropathy   . Oxygen dependent    at hs  . Personal history of chemotherapy 2017   bladder ca  . Personal history of radiation therapy 2017   bladder ca  . Sleep apnea    uses cpap  . Urinary obstruction 01/2016    Past Surgical History: Past Surgical History:  Procedure Laterality Date  . A/V SHUNT INTERVENTION N/A 05/12/2018   Procedure: A/V SHUNT INTERVENTION;  Surgeon: Algernon Huxley, MD;  Location: [place] [facility];  Service: Cardiovascular;  Laterality: N/A;  . A/V SHUNT INTERVENTION Left 07/29/2018   Procedure: A/V SHUNT INTERVENTION;  Surgeon: Katha Cabal, MD;  Location: [place] [facility];  Service: Cardiovascular;  Laterality: Left;  . ABDOMINAL HYSTERECTOMY    . AV FISTULA PLACEMENT Left 02/17/2017   Procedure: INSERTION OF ARTERIOVENOUS (AV) GORE-TEX GRAFT ARM(BRACHIAL AXILLARY);  Surgeon: Katha Cabal, MD;  Location: [facility];  Service: Vascular;  Laterality: Left;  . CYSTOSCOPY W/ RETROGRADES Bilateral 02/17/2016   Procedure:  CYSTOSCOPY WITH RETROGRADE PYELOGRAM;  Surgeon: Hollice Espy, MD;  Location: [facility];  Service: Urology;  Laterality: Bilateral;  . CYSTOSCOPY W/ RETROGRADES Bilateral 10/12/2016   Procedure: CYSTOSCOPY WITH RETROGRADE PYELOGRAM;  Surgeon: Hollice Espy, MD;  Location: [facility];  Service: Urology;  Laterality: Bilateral;  . CYSTOSCOPY W/ RETROGRADES Bilateral 06/09/2017   Procedure: CYSTOSCOPY WITH RETROGRADE PYELOGRAM;  Surgeon: Hollice Espy, MD;  Location: [facility];  Service: Urology;  Laterality:  Bilateral;  . CYSTOSCOPY W/ RETROGRADES Bilateral 10/07/2017   Procedure: CYSTOSCOPY WITH RETROGRADE PYELOGRAM;  Surgeon: Hollice Espy, MD;  Location: [facility];  Service: Urology;  Laterality: Bilateral;  . CYSTOSCOPY W/ URETERAL STENT PLACEMENT Left 05/12/2016   Procedure: CYSTOSCOPY WITH STENT REPLACEMENT;  Surgeon: Hollice Espy, MD;  Location: [facility];  Service: Urology;  Laterality: Left;  . CYSTOSCOPY W/ URETERAL STENT PLACEMENT Left 10/12/2016   Procedure: CYSTOSCOPY WITH STENT REPLACEMENT;  Surgeon: Hollice Espy, MD;  Location: [facility];  Service: Urology;  Laterality: Left;  . CYSTOSCOPY WITH BIOPSY N/A 06/09/2017   Procedure: CYSTOSCOPY WITH BLADDER BIOPSY;  Surgeon: Hollice Espy, MD;  Location: [facility];  Service: Urology;  Laterality: N/A;  . CYSTOSCOPY WITH STENT PLACEMENT Left 01/21/2016   Procedure: CYSTOSCOPY WITH double J STENT PLACEMENT;  Surgeon: Franchot Gallo, MD;  Location: [facility];  Service: Urology;  Laterality: Left;  . CYSTOSCOPY WITH STENT PLACEMENT Right 10/12/2016   Procedure: CYSTOSCOPY WITH STENT PLACEMENT;  Surgeon: Hollice Espy, MD;  Location: [facility];  Service: Urology;  Laterality: Right;  . DIALYSIS/PERMA CATHETER INSERTION N/A 11/20/2016   Procedure: Dialysis/Perma Catheter Insertion;  Surgeon: Algernon Huxley, MD;  Location: [place] [facility];  Service: Cardiovascular;  Laterality: N/A;  . DIALYSIS/PERMA CATHETER REMOVAL N/A 03/30/2017   Procedure: DIALYSIS/PERMA CATHETER REMOVAL;  Surgeon: Katha Cabal, MD;  Location: [place] [facility];  Service: Cardiovascular;  Laterality: N/A;  . KIDNEY SURGERY  01/21/2016   IR NEPHROSTOMY PLACEMENT LEFT   . PERIPHERAL VASCULAR CATHETERIZATION N/A 04/07/2016   Procedure: Glori Luis Cath Insertion;  Surgeon: Katha Cabal, MD;  Location: [place] [facility];  Service: Cardiovascular;  Laterality: N/A;  . PERIPHERAL VASCULAR THROMBECTOMY Left 06/02/2017   Procedure: PERIPHERAL VASCULAR THROMBECTOMY;   Surgeon: Algernon Huxley, MD;  Location: [place] [facility];  Service: Cardiovascular;  Laterality: Left;  . PERIPHERAL VASCULAR THROMBECTOMY Left 01/28/2018   Procedure: PERIPHERAL VASCULAR THROMBECTOMY;  Surgeon: Katha Cabal, MD;  Location: [place] [facility];  Service: Cardiovascular;  Laterality: Left;  . PERIPHERAL VASCULAR THROMBECTOMY Left 03/14/2018   Procedure: PERIPHERAL VASCULAR THROMBECTOMY;  Surgeon: Algernon Huxley, MD;  Location: [place] [facility];  Service: Cardiovascular;  Laterality: Left;  . PERIPHERAL VASCULAR THROMBECTOMY Left 03/16/2018   Procedure: PERIPHERAL VASCULAR THROMBECTOMY;  Surgeon: Katha Cabal, MD;  Location: [place] [facility];  Service: Cardiovascular;  Laterality: Left;  . PORTA CATH REMOVAL N/A 02/07/2019   Procedure: PORTA CATH REMOVAL;  Surgeon: Katha Cabal, MD;  Location: [place] [facility];  Service: Cardiovascular;  Laterality: N/A;  . PORTACATH PLACEMENT Right   . ROTATOR CUFF REPAIR Left   . TEMPORARY DIALYSIS CATHETER N/A 05/11/2018   Procedure: TEMPORARY DIALYSIS CATHETER;  Surgeon: Katha Cabal, MD;  Location: [place] [facility];  Service: Cardiovascular;  Laterality: N/A;  . TEMPORARY DIALYSIS CATHETER Left 07/27/2018   Procedure: TEMPORARY DIALYSIS CATHETER;  Surgeon: Katha Cabal, MD;  Location: [facility] [facility];  Service: Cardiovascular;  Laterality: Left;  . TRANSURETHRAL  RESECTION OF BLADDER TUMOR N/A 02/17/2016   Procedure: TRANSURETHRAL RESECTION OF BLADDER TUMOR (TURBT)-LARGE;  Surgeon: Hollice Espy, MD;  Location: [facility];  Service: Urology;  Laterality: N/A;  . TRANSURETHRAL RESECTION OF BLADDER TUMOR N/A 10/07/2017   Procedure: TRANSURETHRAL RESECTION OF BLADDER TUMOR (TURBT)-small;  Surgeon: Hollice Espy, MD;  Location: [facility];  Service: Urology;  Laterality: N/A;  . URETEROSCOPY Left 02/17/2016   Procedure: URETEROSCOPY;  Surgeon: Hollice Espy, MD;  Location: [facility];  Service: Urology;   Laterality: Left;  . URETEROSCOPY Right 10/12/2016   Procedure: URETEROSCOPY;  Surgeon: Hollice Espy, MD;  Location: [facility];  Service: Urology;  Laterality: Right;     Allergies:   Allergies  Allergen Reactions  . Glipizide Er Other (See Comments)    Hypoglycemia   . Percocet [Oxycodone-Acetaminophen] Hives     Social History:  reports that she quit smoking about 17 years ago. Her smoking use included cigarettes. She has never used smokeless tobacco. She reports that she does not drink alcohol or use drugs.   Family History: Family History  Problem Relation Age of Onset  . Diabetes Mother   . Cancer Father   . Breast cancer Neg Hx   . Kidney disease Neg Hx     Physical Exam: Vitals:   09/05/19 1256 09/05/19 1427 09/05/19 1532 09/05/19 1709  BP: 130/72  (!) 151/69 (!) 152/71  Pulse: 84  82 86  Resp: (!) 22  16 20   Temp: (!) 96.7 F (35.9 C)  (!) 97.5 F (36.4 C) 98.8 F (37.1 C)  TempSrc:   Oral Oral  SpO2: 100% 97% 97% 96%    General: Not in acute distress HEENT: PERRL, EOMI, no scleral icterus, No JVD or bruit Cardiac: S1/S2, RRR, No murmurs, gallops or rubs Pulm: Clear to auscultation bilaterally. No rales, wheezing, rhonchi or rubs. Abd: Soft, nondistended, nontender, no rebound pain, no organomegaly, BS present Ext: No edema. 2+DP/PT pulse bilaterally Musculoskeletal: s/p of left hip surgery with tenderness, no bleeding or drainage. Skin: No rashes.  Neuro: Alert and oriented X3, cranial nerves II-XII grossly intact, Psych: Patient is not psychotic, no suicidal or hemocidal ideation.     Data reviewed:  I have personally reviewed following labs and imaging studies Labs:  CBC: Recent Labs  Lab 09/05/19 0647 09/05/19 1435  WBC  --  9.8  HGB 15.0 11.6*  HCT 44.0 37.0  MCV  --  99.7  PLT  --  146*    Basic Metabolic Panel: Recent Labs  Lab 09/05/19 0647  NA 140  K 4.2  CL 108  GLUCOSE 93  BUN 40*  CREATININE 8.80*   GFR Estimated  Creatinine Clearance: 5.7 mL/min (A) (by C-G formula based on SCr of 8.8 mg/dL (H)). Liver Function Tests: No results for input(s): AST, ALT, ALKPHOS, BILITOT, PROT, ALBUMIN in the last 168 hours. No results for input(s): LIPASE, AMYLASE in the last 168 hours. No results for input(s): AMMONIA in the last 168 hours. Coagulation profile Recent Labs  Lab 09/01/19 1052  INR 1.0    Cardiac Enzymes: No results for input(s): CKTOTAL, CKMB, CKMBINDEX, TROPONINI in the last 168 hours. BNP: Invalid input(s): POCBNP CBG: Recent Labs  Lab 09/05/19 0618 09/05/19 1022  GLUCAP 85 95   D-Dimer No results for input(s): DDIMER in the last 72 hours. Hgb A1c No results for input(s): HGBA1C in the last 72 hours. Lipid Profile No results for input(s): CHOL, HDL, LDLCALC, TRIG, CHOLHDL, LDLDIRECT in the last 72 hours.  Thyroid function studies No results for input(s): TSH, T4TOTAL, T3FREE, THYROIDAB in the last 72 hours.  Invalid input(s): FREET3 Anemia work up No results for input(s): VITAMINB12, FOLATE, FERRITIN, TIBC, IRON, RETICCTPCT in the last 72 hours. Urinalysis    Component Value Date/Time   COLORURINE YELLOW (A) 01/30/2018 2055   APPEARANCEUR Cloudy (A) 02/28/2019 0902   LABSPEC 1.015 01/30/2018 2055   LABSPEC 1.012 08/02/2012 1114   PHURINE 7.0 01/30/2018 2055   GLUCOSEU Negative 02/28/2019 0902   GLUCOSEU Negative 08/02/2012 1114   HGBUR LARGE (A) 01/30/2018 2055   BILIRUBINUR Negative 02/28/2019 0902   BILIRUBINUR Negative 08/02/2012 [place] 01/30/2018 2055   PROTEINUR 2+ (A) 02/28/2019 0902   PROTEINUR 100 (A) 01/30/2018 2055   NITRITE Negative 02/28/2019 0902   NITRITE NEGATIVE 01/30/2018 2055   LEUKOCYTESUR Trace (A) 02/28/2019 0902   LEUKOCYTESUR Negative 08/02/2012 1114     Microbiology Recent Results (from the past 240 hour(s))  SARS CORONAVIRUS 2 (TAT 6-24 HRS) Nasopharyngeal Nasopharyngeal Swab     Status: None   Collection Time: 09/01/19  11:59 AM   Specimen: Nasopharyngeal Swab  Result Value Ref Range Status   SARS Coronavirus 2 NEGATIVE NEGATIVE Final    Comment: (NOTE) SARS-CoV-2 target nucleic acids are NOT DETECTED. The SARS-CoV-2 RNA is generally detectable in upper and lower respiratory specimens during the acute phase of infection. Negative results do not preclude SARS-CoV-2 infection, do not rule out co-infections with other pathogens, and should not be used as the sole basis for treatment or other patient management decisions. Negative results must be combined with clinical observations, patient history, and epidemiological information. The expected result is Negative. Fact Sheet for Patients: SugarRoll.be Fact Sheet for Healthcare Providers: https://www.woods-mathews.com/ This test is not yet approved or cleared by the [place] FDA and  has been authorized for detection and/or diagnosis of SARS-CoV-2 by FDA under an Emergency Use Authorization (EUA). This EUA will remain  in effect (meaning this test can be used) for the duration of the COVID-19 declaration under Section 56 4(b)(1) of the Act, 21 U.S.C. section 360bbb-3(b)(1), unless the authorization is terminated or revoked sooner. Performed at [facility], [place] [address], [place], [place] [address]   Surgical pcr screen     Status: Abnormal   Collection Time: 09/01/19 11:59 AM   Specimen: Nasal Mucosa; Nasal Swab  Result Value Ref Range Status   MRSA, PCR POSITIVE (A) NEGATIVE Final    Comment: RESULT CALLED TO, READ BACK BY AND VERIFIED WITH: HEATHER SILVER @1545  09/01/19 MJU    Staphylococcus aureus POSITIVE (A) NEGATIVE Final    Comment: (NOTE) The Xpert SA Assay (FDA approved for NASAL specimens in patients 47 years of age and older), is one component of a comprehensive surveillance program. It is not intended to diagnose infection nor to guide or monitor treatment. Performed at [facility], [name] [place], [place] [address]        Inpatient Medications:   Scheduled Meds: . acetaminophen  500 mg Oral Q6H  . ascorbic acid  500 mg Oral BID  . atorvastatin  40 mg Oral QPM  . calcium acetate  1,334 mg Oral TID WC  . calcium carbonate  1 tablet Oral Daily  . cholecalciferol  1,000 Units Oral Daily  . cinacalcet  30 mg Oral Daily  . docusate sodium  100 mg Oral BID  . [START ON 09/06/2019] enoxaparin (LOVENOX) injection  30 mg Subcutaneous Q24H  .  feeding supplement (NEPRO CARB STEADY)  237 mL Oral BID BM  . [START ON 09/06/2019] ferrous sulfate  325 mg Oral QPC breakfast  . gabapentin  600 mg Oral TID  . multivitamin  1 tablet Oral QHS  . oxybutynin  5 mg Oral TID  . oxyCODONE      . pantoprazole  40 mg Oral Daily  . sodium chloride flush       Continuous Infusions: . sodium chloride 50 mL/hr at 09/05/19 1529  . acetaminophen    .  ceFAZolin (ANCEF) IV 2 g (09/05/19 1355)     Radiological Exams on Admission: DG HIP UNILAT W OR W/O PELVIS 2-3 VIEWS LEFT  Result Date: 09/05/2019 CLINICAL DATA:  Hip replacement, pain EXAM: DG HIP (WITH OR WITHOUT PELVIS) 2-3V LEFT COMPARISON:  07/28/2018 FINDINGS: Post left total hip arthroplasty. No evidence of complication. Residual postoperative air in the soft tissues. IMPRESSION: Post total left hip arthroplasty without evidence of complication. Electronically Signed   By: Macy Mis M.D.   On: 09/05/2019 10:50    Impression/Recommendations Principal Problem:   Status post total hip replacement, left Active Problems:   Anemia in chronic kidney disease   COPD (chronic obstructive pulmonary disease) (HCC)   CAD in native artery   Benign essential HTN   Acid reflux   Type II diabetes mellitus with renal manifestations (H[facility])   ESRD on dialysis ([place])   Status post total hip replacement, left: -pain control per primary ortho team  Anemia in chronic kidney disease: Hemoglobin stable,  11.6 -Continue iron supplement  COPD (chronic obstructive pulmonary disease) ([place]): Stable -As needed albuterol  CAD in native artery: no CP -lipitor  HTN:  -hold lasix and coaarr due to soft Bp -hydralazine prn  Acid reflux: -protonix  Diet controlled Type II diabetes mellitus with renal manifestations ([place]): Most recent A1c 6.2, well controled. Patient is not taking med home. CBG 93. -no treatment needed  ESRD on dialysis (MWF): K4.2 -renal, Dr. Holley Raring is consulted    Thank you for this consultation.  Our [facility] hospitalist team will follow the patient with you.   Time Spent: 30 min  Ivor Costa M.D. [facility] 09/05/2019, 6:23 PM

## 2019-09-05 NOTE — H&P (Signed)
Paper H&P to be scanned into permanent record. H&P reviewed and patient re-examined. No changes. 

## 2019-09-05 NOTE — TOC Benefit Eligibility Note (Signed)
Transition of Care [facility]) Benefit Eligibility Note    Patient Details  Name: Barbara [name] MRN: [id number] Date of Birth: 03-17-1941   Medication/Dose: Enoxaparin 26m once daily for 14 days  Covered?: Yes  Tier: Other(Tier 1)  Prescription Coverage [facility]  Spoke with Person/Company/Phone Number:: Barbara [name] & Barbara [name] at [employer] [phone] Co-Pay: $0 estimated copay  Prior Approval: No  Deductible: Unmet($445 deductible.  Medication copay may be subject to deductible.)  Additional Notes: Rep could not see in her notes if certain Tiers apply to deductible.  Their records show patient filled Tier 1 medication [facility] recently and did not have to pay deductible so it's possible it only applies to higher Tiers.    HDannette BarbaraPhone Number: [phone] [phone]/16/2021, 4:55 PM

## 2019-09-05 NOTE — Anesthesia Procedure Notes (Signed)
Spinal  Patient location during procedure: OR Start time: 09/05/2019 7:35 AM End time: 09/05/2019 7:47 AM Staffing Performed: anesthesiologist  Anesthesiologist: Arita Miss, MD Preanesthetic Checklist Completed: patient identified, IV checked, site marked, risks and benefits discussed, surgical consent, monitors and equipment checked, pre-op evaluation and timeout performed Spinal Block Patient position: sitting Prep: ChloraPrep and site prepped and draped Patient monitoring: heart rate, continuous pulse ox, blood pressure and cardiac monitor Approach: midline Location: L3-4 Injection technique: single-shot Needle Needle type: Quincke  Needle gauge: 22 G Needle length: 9 cm Assessment Sensory level: T10 Additional Notes Negative paresthesia. Negative blood return. Positive free-flowing CSF. Expiration date of kit checked and confirmed. Patient tolerated procedure well, without complications.

## 2019-09-05 NOTE — Progress Notes (Signed)
Updated Jeneen Rinks, pt's spouse as pt is in PACU  awaiting bed assignment. Spouse states he will go home and await phone call with room assignment. Cell number given, [phone]

## 2019-09-05 NOTE — OR Nursing (Signed)
Cane and purse stayed given to spouse prior to pre-op

## 2019-09-05 NOTE — TOC Progression Note (Signed)
Transition of Care [facility]) - Progression Note    Patient Details  Name: Barbara [name] MRN: [id number] Date of Birth: [birth year]  Transition of [facility] CM/SW Contact  Su Hilt, RN Phone Number: 09/05/2019, 3:05 PM  Clinical Narrative:     Requested the price of Lovneox 40 mg daily X 14 days, will notify the patient once obtained       Expected Discharge Plan and Services                                                 Social Determinants of Health (SDOH) Interventions    Readmission Risk Interventions No flowsheet data found.

## 2019-09-05 NOTE — Transfer of Care (Signed)
Immediate Anesthesia Transfer of Care Note  Patient: Barbara [name]  Procedure(s) Performed: TOTAL HIP ARTHROPLASTY (Left Hip)  Patient Location: PACU  Anesthesia Type:Spinal  Level of Consciousness: awake  Airway & Oxygen Therapy: Patient Spontanous Breathing  Post-op Assessment: Report given to RN  Post vital signs: stable  Last Vitals:  Vitals Value Taken Time  BP 152/74 09/05/19 1016  Temp 36.6 C 09/05/19 1016  Pulse 90 09/05/19 1019  Resp 20 09/05/19 1019  SpO2 100 % 09/05/19 1019  Vitals shown include unvalidated device data.  Last Pain:  Vitals:   09/05/19 0610  TempSrc: Tympanic         Complications: No apparent anesthesia complications

## 2019-09-06 ENCOUNTER — Inpatient Hospital Stay: Payer: Medicare Other

## 2019-09-06 DIAGNOSIS — Z96642 Presence of left artificial hip joint: Secondary | ICD-10-CM

## 2019-09-06 DIAGNOSIS — J9601 Acute respiratory failure with hypoxia: Secondary | ICD-10-CM

## 2019-09-06 DIAGNOSIS — Z992 Dependence on renal dialysis: Secondary | ICD-10-CM

## 2019-09-06 DIAGNOSIS — N186 End stage renal disease: Secondary | ICD-10-CM

## 2019-09-06 DIAGNOSIS — J449 Chronic obstructive pulmonary disease, unspecified: Secondary | ICD-10-CM

## 2019-09-06 DIAGNOSIS — N185 Chronic kidney disease, stage 5: Secondary | ICD-10-CM

## 2019-09-06 LAB — MAGNESIUM: Magnesium: 2 mg/dL (ref 1.7–2.4)

## 2019-09-06 LAB — BLOOD GAS, ARTERIAL
Acid-Base Excess: 0.9 mmol/L (ref 0.0–2.0)
Acid-base deficit: 15 mmol/L — ABNORMAL HIGH (ref 0.0–2.0)
Acid-base deficit: 4.1 mmol/L — ABNORMAL HIGH (ref 0.0–2.0)
Bicarbonate: 12.8 mmol/L — ABNORMAL LOW (ref 20.0–28.0)
Bicarbonate: 24.6 mmol/L (ref 20.0–28.0)
Bicarbonate: 27.5 mmol/L (ref 20.0–28.0)
FIO2: 0.28
FIO2: 0.4
FIO2: 0.4
MECHVT: 400 mL
MECHVT: 450 mL
Mechanical Rate: 15
O2 Saturation: 94.9 %
O2 Saturation: 89.4 %
O2 Saturation: 98.5 %
PEEP: 5 cmH2O
PEEP: 5 cmH2O
Patient temperature: 37
Patient temperature: 37
Patient temperature: 37
RATE: 15 resp/min
RATE: 15 resp/min
pCO2 arterial: 36 mmHg (ref 32.0–48.0)
pCO2 arterial: 60 mmHg — ABNORMAL HIGH (ref 32.0–48.0)
pCO2 arterial: 51 mmHg — ABNORMAL HIGH (ref 32.0–48.0)
pH, Arterial: 7.16 — CL (ref 7.350–7.450)
pH, Arterial: 7.22 — ABNORMAL LOW (ref 7.350–7.450)
pH, Arterial: 7.34 — ABNORMAL LOW (ref 7.350–7.450)
pO2, Arterial: 95 mmHg (ref 83.0–108.0)
pO2, Arterial: 69 mmHg — ABNORMAL LOW (ref 83.0–108.0)
pO2, Arterial: 120 mmHg — ABNORMAL HIGH (ref 83.0–108.0)

## 2019-09-06 LAB — BASIC METABOLIC PANEL
Anion gap: 14 (ref 5–15)
BUN: 49 mg/dL — ABNORMAL HIGH (ref 8–23)
CO2: 20 mmol/L — ABNORMAL LOW (ref 22–32)
Calcium: 7.6 mg/dL — ABNORMAL LOW (ref 8.9–10.3)
Chloride: 104 mmol/L (ref 98–111)
Creatinine, Ser: 10.14 mg/dL — ABNORMAL HIGH (ref 0.44–1.00)
GFR calc Af Amer: 4 mL/min — ABNORMAL LOW (ref >60)
GFR calc non Af Amer: 3 mL/min — ABNORMAL LOW (ref >60)
Glucose, Bld: 119 mg/dL — ABNORMAL HIGH (ref 70–99)
Potassium: 3.8 mmol/L (ref 3.5–5.1)
Sodium: 138 mmol/L (ref 135–145)

## 2019-09-06 LAB — CBC
HCT: 34.2 % — ABNORMAL LOW (ref 36.0–46.0)
Hemoglobin: 10.5 g/dL — ABNORMAL LOW (ref 12.0–15.0)
MCH: 30.8 pg (ref 26.0–34.0)
MCHC: 30.7 g/dL (ref 30.0–36.0)
MCV: 100.3 fL — ABNORMAL HIGH (ref 80.0–100.0)
Platelets: 132 10*3/uL — ABNORMAL LOW (ref 150–400)
RBC: 3.41 MIL/uL — ABNORMAL LOW (ref 3.87–5.11)
RDW: 14.6 % (ref 11.5–15.5)
WBC: 8.1 10*3/uL (ref 4.0–10.5)
nRBC: 0 % (ref 0.0–0.2)

## 2019-09-06 LAB — SURGICAL PATHOLOGY

## 2019-09-06 LAB — MRSA PCR SCREENING: MRSA by PCR: POSITIVE — AB

## 2019-09-06 LAB — GLUCOSE, CAPILLARY
Glucose-Capillary: 111 mg/dL — ABNORMAL HIGH (ref 70–99)
Glucose-Capillary: 109 mg/dL — ABNORMAL HIGH (ref 70–99)
Glucose-Capillary: 101 mg/dL — ABNORMAL HIGH (ref 70–99)

## 2019-09-06 LAB — PHOSPHORUS: Phosphorus: 8.8 mg/dL — ABNORMAL HIGH (ref 2.5–4.6)

## 2019-09-06 LAB — TROPONIN I (HIGH SENSITIVITY)
Troponin I (High Sensitivity): 34 ng/L — ABNORMAL HIGH (ref <18)
Troponin I (High Sensitivity): 35 ng/L — ABNORMAL HIGH (ref <18)

## 2019-09-06 MED ORDER — ATROPINE SULFATE 1 MG/10ML IJ SOSY
PREFILLED_SYRINGE | INTRAMUSCULAR | Status: AC
  Administered 2019-09-06: 1 mg
  Filled 2019-09-06: qty 10

## 2019-09-06 MED ORDER — ATROPINE SULFATE 1 MG/10ML IJ SOSY: 1.0000 mg | PREFILLED_SYRINGE | Freq: Once | INTRAMUSCULAR | Status: DC

## 2019-09-06 MED ORDER — LIDOCAINE-PRILOCAINE 2.5-2.5 % EX CREA
1.0000 "application " | TOPICAL_CREAM | CUTANEOUS | Status: DC | PRN
  Filled 2019-09-06: qty 5

## 2019-09-06 MED ORDER — FENTANYL 2500MCG IN NS 250ML (10MCG/ML) PREMIX INFUSION
0.0000 ug/h | INTRAVENOUS | Status: DC
  Administered 2019-09-06: 50 ug/h via INTRAVENOUS
  Administered 2019-09-07: 200 ug/h via INTRAVENOUS
  Administered 2019-09-07 – 2019-09-08 (×2): 175 ug/h via INTRAVENOUS
  Filled 2019-09-06 (×4): qty 250

## 2019-09-06 MED ORDER — SODIUM CHLORIDE 0.9 % IV SOLN: 100.0000 mL | INTRAVENOUS | Status: DC | PRN

## 2019-09-06 MED ORDER — SODIUM CHLORIDE 0.9 % IV SOLN: 250.0000 mL | INTRAVENOUS | Status: DC

## 2019-09-06 MED ORDER — PHENYLEPHRINE HCL-NACL 10-0.9 MG/250ML-% IV SOLN
25.0000 ug/min | INTRAVENOUS | Status: DC
  Filled 2019-09-06: qty 250

## 2019-09-06 MED ORDER — MIDAZOLAM HCL 2 MG/2ML IJ SOLN
4.0000 mg | Freq: Once | INTRAMUSCULAR | Status: AC
  Administered 2019-09-06: 4 mg via INTRAVENOUS
  Filled 2019-09-06: qty 4

## 2019-09-06 MED ORDER — FENTANYL CITRATE (PF) 100 MCG/2ML IJ SOLN
200.0000 ug | Freq: Once | INTRAMUSCULAR | Status: AC
  Administered 2019-09-06: 200 ug via INTRAVENOUS
  Filled 2019-09-06: qty 4

## 2019-09-06 MED ORDER — HEPARIN SODIUM (PORCINE) 5000 UNIT/ML IJ SOLN
5000.0000 [IU] | Freq: Three times a day (TID) | INTRAMUSCULAR | Status: DC
  Administered 2019-09-06 – 2019-09-10 (×13): 5000 [IU] via SUBCUTANEOUS
  Filled 2019-09-06 (×12): qty 1

## 2019-09-06 MED ORDER — CHLORHEXIDINE GLUCONATE CLOTH 2 % EX PADS
6.0000 | MEDICATED_PAD | Freq: Every day | CUTANEOUS | Status: DC
  Administered 2019-09-06 – 2019-09-13 (×8): 6 via TOPICAL

## 2019-09-06 MED ORDER — STERILE WATER FOR INJECTION IV SOLN
INTRAVENOUS | Status: DC
  Filled 2019-09-06 (×6): qty 850

## 2019-09-06 MED ORDER — PENTAFLUOROPROP-TETRAFLUOROETH EX AERO
1.0000 "application " | INHALATION_SPRAY | CUTANEOUS | Status: DC | PRN
  Filled 2019-09-06: qty 30

## 2019-09-06 MED ORDER — ALTEPLASE 2 MG IJ SOLR
2.0000 mg | Freq: Once | INTRAMUSCULAR | Status: DC | PRN
  Filled 2019-09-06: qty 2

## 2019-09-06 MED ORDER — SODIUM CHLORIDE 0.9 % IV SOLN
100.0000 mg | Freq: Two times a day (BID) | INTRAVENOUS | Status: DC
  Administered 2019-09-07 – 2019-09-24 (×35): 100 mg via INTRAVENOUS
  Filled 2019-09-06 (×39): qty 10

## 2019-09-06 MED ORDER — HEPARIN SODIUM (PORCINE) 1000 UNIT/ML DIALYSIS
1000.0000 [IU] | INTRAMUSCULAR | Status: DC | PRN
  Filled 2019-09-06: qty 1

## 2019-09-06 MED ORDER — VECURONIUM BROMIDE 10 MG IV SOLR
10.0000 mg | Freq: Once | INTRAVENOUS | Status: AC
  Administered 2019-09-06: 10 mg via INTRAVENOUS
  Filled 2019-09-06: qty 10

## 2019-09-06 MED ORDER — SODIUM CHLORIDE 0.9 % IV SOLN
200.0000 mg | Freq: Once | INTRAVENOUS | Status: AC
  Administered 2019-09-06: 200 mg via INTRAVENOUS
  Filled 2019-09-06: qty 20

## 2019-09-06 MED ORDER — SODIUM CHLORIDE 0.9 % IV SOLN
25.0000 ug/min | INTRAVENOUS | Status: DC
  Administered 2019-09-06: 13:00:00 25 ug/min via INTRAVENOUS
  Filled 2019-09-06: qty 10

## 2019-09-06 MED ORDER — ORAL CARE MOUTH RINSE
15.0000 mL | OROMUCOSAL | Status: DC
  Administered 2019-09-06 – 2019-09-08 (×21): 15 mL via OROMUCOSAL

## 2019-09-06 MED ORDER — LIDOCAINE HCL (PF) 1 % IJ SOLN
5.0000 mL | INTRAMUSCULAR | Status: DC | PRN
  Filled 2019-09-06: qty 5

## 2019-09-06 MED ORDER — CHLORHEXIDINE GLUCONATE 0.12% ORAL RINSE (MEDLINE KIT)
15.0000 mL | Freq: Two times a day (BID) | OROMUCOSAL | Status: DC
  Administered 2019-09-06 – 2019-09-08 (×4): 15 mL via OROMUCOSAL

## 2019-09-06 MED ORDER — PHENYLEPHRINE CONCENTRATED 100MG/250ML (0.4 MG/ML) INFUSION SIMPLE
25.0000 ug/min | INTRAVENOUS | Status: DC
  Administered 2019-09-06: 20 ug/min via INTRAVENOUS
  Administered 2019-09-08: 90 ug/min via INTRAVENOUS
  Filled 2019-09-06 (×4): qty 250

## 2019-09-06 NOTE — Progress Notes (Signed)
Pt arrived from floor at this time. Pt immediately placed on bipap and is responding to pain. VSS. Breathing is labored. Pt unable to follow commands. Dr. Mortimer Fries is at bedside.

## 2019-09-06 NOTE — Procedures (Signed)
Endotracheal Intubation: Patient required placement of an artificial airway secondary to Respiratory Failure  Consent: Emergent.   Hand washing performed prior to starting the procedure.   Medications administered for sedation prior to procedure:  Midazolam 4 mg IV,  Vecuronium 10 mg IV, Fentanyl 100 mcg IV.    A time out procedure was called and correct patient, name, & ID confirmed. Needed supplies and equipment were assembled and checked to include ETT, 10 ml syringe, Glidescope, Mac and Miller blades, suction, oxygen and bag mask valve, end tidal CO2 monitor.   Patient was positioned to align the mouth and pharynx to facilitate visualization of the glottis.   Heart rate, SpO2 and blood pressure was continuously monitored during the procedure. Pre-oxygenation was conducted prior to intubation and endotracheal tube was placed through the vocal cords into the trachea.     The artificial airway was placed under direct visualization via glidescope route using a 8.0 ETT on the first attempt.  ETT was secured at 23 cm mark.  Placement was confirmed by auscuitation of lungs with good breath sounds bilaterally and no stomach sounds.  Condensation was noted on endotracheal tube.   Pulse ox 98%.  CO2 detector in place with appropriate color change.   Complications: None .   Operator: Elidia Bonenfant.   Chest radiograph ordered and pending.    Jaysha Lasure David Astin Sayre, M.D.  [facility]  Medical Director ICU-[facility]  Medical Director [facility] Cardio-Pulmonary Department       

## 2019-09-06 NOTE — Anesthesia Postprocedure Evaluation (Signed)
Anesthesia Post Note  Patient: Barbara [name]  Procedure(s) Performed: TOTAL HIP ARTHROPLASTY (Left Hip)  Patient location during evaluation: ICU Anesthesia Type: Spinal Level of consciousness: oriented and awake and alert Pain management: pain level controlled Vital Signs Assessment: post-procedure vital signs reviewed and stable Respiratory status: respiratory function stable and patient connected to nasal cannula oxygen (on Bi-pap) Cardiovascular status: blood pressure returned to baseline and stable Postop Assessment: no headache, no backache and no apparent nausea or vomiting Anesthetic complications: no     Last Vitals:  Vitals:   09/06/19 0422 09/06/19 0747  BP: (!) 120/48 (!) 108/54  Pulse: 88 73  Resp: 16 20  Temp: 37.3 C 37.1 C  SpO2: 96% 94%    Last Pain:  Vitals:   09/06/19 0747  TempSrc: Axillary  PainSc:                  Estill Batten

## 2019-09-06 NOTE — Progress Notes (Signed)
Patient is s/p left total hip replacement, POD1.  Attempted several times to see patient this morning.  Patient was doing well last night, unfortunately a code was called for her this morning.  At this time the patient is currently intubated and a central line is being placed at this time.  Will attempt to see patient later this evening.  Raquel Tavaughn Silguero, PA-C [place]

## 2019-09-06 NOTE — Progress Notes (Signed)
Barbara [name] met pt.'s grandtr. and great grandson in ICU waiting rm.  Barbara [name]. said MD had called her saying pt.'s condition was serious and 'it was time to call family together'; 'they don't think she's going to make it' gdtr. shared.  Gdtr. needed information about visitor policy; [place] inquired w/RN re: comfort care status --> pt. in rm. being intubated, Rn shared; not comfort care @ this time.  Anderson informed family MD was busy w/pt. in rm. and would follow up when he was finished.  1pm: CH encountered gdtr. in ICU; she came to find Barbara [name], lamenting the fact that additional family members who had just arrived were not being allowed to come to ICU waiting area.  [place] worked w/ Systems analyst to arrange solution to  apparent miscommunication re: family visitation.  Family allowed to gather in ICU consult rm.   CH helped MD find key family memebrs to discuss POC for pt.; pt. made DNR w/family's agreement.  No further needs expressed.       09/06/19 1230  Clinical Encounter Type  Visited With Family;Health care provider  Visit Type Initial;Critical Care;Psychological support;Social support  Consult/Referral To Chaplain  Spiritual Encounters  Spiritual Needs Emotional  Stress Factors  Family Stress Factors Health changes;Loss of control;Major life changes

## 2019-09-06 NOTE — Consult Note (Signed)
Name: Barbara [name] MRN: [id number] DOB: 12-02-1940     CONSULTATION DATE: 09/05/2019  REFERRING MD :  poggi  CHIEF COMPLAINT:  resp failure   HISTORY OF PRESENT ILLNESS:  79 yo AAF admitted to ICU for severe resp failure S/p LEFT hip replacement for OA and PAIN Patient with ESRD on HD  Transferred to ICU for severe resp distress, severe metabolic acidosis and encephalopathy  PH 7.16 Placed on biPAP +ABD distention  High risk for cardiac arrest and intubation  PAST MEDICAL HISTORY :   has a past medical history of Adult celiac disease, Anemia associated with chronic renal failure (2017), Arthritis, Asthma, Cancer ([place]) (2017), Chronic kidney disease, CKD (chronic kidney disease), COPD (chronic obstructive pulmonary disease) ([place]), Coronary artery disease, Diabetes mellitus without complication ([place]), Dialysis patient ([place]), Dyspnea, Elevated lipids, GERD (gastroesophageal reflux disease), Hematuria, Hypertension, Lower back pain, Neuropathy, Oxygen dependent, Personal history of chemotherapy (2017), Personal history of radiation therapy (2017), Sleep apnea, and Urinary obstruction (01/2016).  has a past surgical history that includes Abdominal hysterectomy; Rotator cuff repair (Left); Kidney surgery (01/21/2016); Cystoscopy with stent placement (Left, 01/21/2016); Transurethral resection of bladder tumor (N/A, 02/17/2016); Ureteroscopy (Left, 02/17/2016); Cystoscopy w/ retrogrades (Bilateral, 02/17/2016); Cardiac catheterization (N/A, 04/07/2016); Cystoscopy w/ ureteral stent placement (Left, 05/12/2016); Cystoscopy w/ ureteral stent placement (Left, 10/12/2016); Cystoscopy w/ retrogrades (Bilateral, 10/12/2016); Ureteroscopy (Right, 10/12/2016); Cystoscopy with stent placement (Right, 10/12/2016); DIALYSIS/PERMA CATHETER INSERTION (N/A, 11/20/2016); Portacath placement (Right); AV fistula placement (Left, 02/17/2017); DIALYSIS/PERMA CATHETER REMOVAL (N/A, 03/30/2017); PERIPHERAL VASCULAR THROMBECTOMY  (Left, 06/02/2017); Cystoscopy with biopsy (N/A, 06/09/2017); Cystoscopy w/ retrogrades (Bilateral, 06/09/2017); Transurethral resection of bladder tumor (N/A, 10/07/2017); Cystoscopy w/ retrogrades (Bilateral, 10/07/2017); PERIPHERAL VASCULAR THROMBECTOMY (Left, 01/28/2018); PERIPHERAL VASCULAR THROMBECTOMY (Left, 03/14/2018); PERIPHERAL VASCULAR THROMBECTOMY (Left, 03/16/2018); TEMPORARY DIALYSIS CATHETER (N/A, 05/11/2018); A/V SHUNT INTERVENTION (N/A, 05/12/2018); TEMPORARY DIALYSIS CATHETER (Left, 07/27/2018); A/V SHUNT INTERVENTION (Left, 07/29/2018); and PORTA CATH REMOVAL (N/A, 02/07/2019). Prior to Admission medications   Medication Sig Start Date End Date Taking? Authorizing Provider  acetaminophen (TYLENOL) 500 MG tablet Take 1,000 mg by mouth 2 (two) times daily as needed for moderate pain or headache.    Yes [provider]  albuterol (PROVENTIL HFA;VENTOLIN HFA) 108 (90 Base) MCG/ACT inhaler Inhale 2 puffs into the lungs every 6 (six) hours as needed for wheezing or shortness of breath.  05/16/18  Yes [provider]  atorvastatin (LIPITOR) 40 MG tablet Take 40 mg by mouth every evening. 05/01/19  Yes [provider]  calcium acetate (PHOSLO) 667 MG capsule Take 2 capsules (1,334 mg total) by mouth 3 (three) times daily with meals. Patient taking differently: Take 1,334 mg by mouth 3 (three) times daily.  11/23/16  Yes Fritzi Mandes, MD  calcium carbonate (TUMS - DOSED IN MG ELEMENTAL CALCIUM) 500 MG chewable tablet Chew 1 tablet by mouth daily.   Yes [provider]  cholecalciferol (VITAMIN D) 1000 units tablet Take 1,000 Units by mouth daily.   Yes [provider]  cinacalcet (SENSIPAR) 30 MG tablet Take 30 mg by mouth daily. With largest meal   Yes [provider]  esomeprazole (NEXIUM) 40 MG capsule Take 40 mg by mouth daily after breakfast.    Yes [provider]  ferrous sulfate 325 (65 FE) MG EC tablet Take 325 mg by mouth daily after  breakfast.    Yes [provider]  furosemide (LASIX) 40 MG tablet Take 40 mg by mouth daily.    Yes [provider]  gabapentin (NEURONTIN)  300 MG capsule Take 600 mg by mouth 3 (three) times daily.   Yes [provider]  ibuprofen (ADVIL) 200 MG tablet Take 200 mg by mouth every 8 (eight) hours as needed (for pain.).    Yes [provider]  loperamide (IMODIUM) 1 MG/5ML solution Take 2 mg by mouth as needed for diarrhea or loose stools.   Yes [provider]  losartan (COZAAR) 50 MG tablet Take 50 mg by mouth every evening. 01/09/19  Yes [provider]  multivitamin (RENA-VIT) TABS tablet Take 1 tablet by mouth at bedtime. 05/26/19  Yes Dahal, Marlowe Aschoff, MD  oxybutynin (DITROPAN) 5 MG tablet Take 5 mg by mouth 3 (three) times daily.   Yes [provider]  vitamin C (VITAMIN C) 500 MG tablet Take 1 tablet (500 mg total) by mouth 2 (two) times daily. 05/26/19  Yes Dahal, Marlowe Aschoff, MD  Blood Glucose Monitoring Suppl (ACCU-CHEK AVIVA PLUS) w/Device KIT (USE TO MONITOR BLOOD SUGARS.) 12/30/17   [provider]  NON FORMULARY Place 1 Units into the nose at bedtime. CPAP Time of use 2100-0600    [provider]  Nutritional Supplements (FEEDING SUPPLEMENT, NEPRO CARB STEADY,) LIQD Take 237 mLs by mouth 2 (two) times daily between meals. 05/26/19   Terrilee Croak, MD  OXYGEN Inhale 2 L into the lungs at bedtime.    [provider]   Allergies  Allergen Reactions  . Glipizide Er Other (See Comments)    Hypoglycemia   . Percocet [Oxycodone-Acetaminophen] Hives    FAMILY HISTORY:  family history includes Cancer in her father; Diabetes in her mother. SOCIAL HISTORY:  reports that she quit smoking about 17 years ago. Her smoking use included cigarettes. She has never used smokeless tobacco. She reports that she does not drink alcohol or use drugs.  REVIEW OF SYSTEMS:   Unable to obtain due to critical illness   VITAL  SIGNS: Temp:  [96.7 F (35.9 C)-99.2 F (37.3 C)] 98.7 F (37.1 C) (02/17 0747) Pulse Rate:  [59-95] 73 (02/17 0747) Resp:  [13-22] 20 (02/17 0747) BP: (87-152)/(48-74) 108/54 (02/17 0747) SpO2:  [92 %-100 %] 94 % (02/17 0747) FiO2 (%):  [28 %] 28 % (02/17 0747) Weight:  [84.9 kg] 84.9 kg (02/17 0747)   I/O last 3 completed shifts: In: 1836.9 [P.O.:410; I.V.:1075.5; IV Piggyback:351.5] Out: 200 [Blood:200] No intake/output data recorded.   SpO2: 94 % O2 Flow Rate (L/min): 1 L/min FiO2 (%): 28 %   Physical Examination:  GENERAL:critically ill appearing, +resp distress HEAD: Normocephalic, atraumatic.  EYES: Pupils equal, round, reactive to light.  No scleral icterus.  MOUTH: Moist mucosal membrane. NECK: Supple. No JVD.  PULMONARY: +rhonchi, +wheezing CARDIOVASCULAR: S1 and S2. Regular rate and rhythm. No murmurs, rubs, or gallops.  GASTROINTESTINAL: Soft, nontender, -distended.  Positive bowel sounds.  MUSCULOSKELETAL: No swelling, clubbing, or edema.  NEUROLOGIC: obtunded SKIN:intact,warm,dry   MEDICATIONS: I have reviewed all medications and confirmed regimen as documented   CULTURE RESULTS   Recent Results (from the past 240 hour(s))  SARS CORONAVIRUS 2 (TAT 6-24 HRS) Nasopharyngeal Nasopharyngeal Swab     Status: None   Collection Time: 09/01/19 11:59 AM   Specimen: Nasopharyngeal Swab  Result Value Ref Range Status   SARS Coronavirus 2 NEGATIVE NEGATIVE Final    Comment: (NOTE) SARS-CoV-2 target nucleic acids are NOT DETECTED. The SARS-CoV-2 RNA is generally detectable in upper and lower respiratory specimens during the acute phase of infection. Negative results do not preclude SARS-CoV-2 infection, do  not rule out co-infections with other pathogens, and should not be used as the sole basis for treatment or other patient management decisions. Negative results must be combined with clinical observations, patient history, and epidemiological information.  The expected result is Negative. Fact Sheet for Patients: SugarRoll.be Fact Sheet for Healthcare Providers: https://www.woods-mathews.com/ This test is not yet approved or cleared by the [place] FDA and  has been authorized for detection and/or diagnosis of SARS-CoV-2 by FDA under an Emergency Use Authorization (EUA). This EUA will remain  in effect (meaning this test can be used) for the duration of the COVID-19 declaration under Section 56 4(b)(1) of the Act, 21 U.S.C. section 360bbb-3(b)(1), unless the authorization is terminated or revoked sooner. Performed at [facility], [place] [address] [place], [place] [address]   Surgical pcr screen     Status: Abnormal   Collection Time: 09/01/19 11:59 AM   Specimen: Nasal Mucosa; Nasal Swab  Result Value Ref Range Status   MRSA, PCR POSITIVE (A) NEGATIVE Final    Comment: RESULT CALLED TO, READ BACK BY AND VERIFIED WITH: HEATHER SILVER @1545  09/01/19 MJU    Staphylococcus aureus POSITIVE (A) NEGATIVE Final    Comment: (NOTE) The Xpert SA Assay (FDA approved for NASAL specimens in patients 36 years of age and older), is one component of a comprehensive surveillance program. It is not intended to diagnose infection nor to guide or monitor treatment. Performed at [facility], [place], [place] [address]           IMAGING    DG HIP UNILAT W OR W/O PELVIS 2-3 VIEWS LEFT  Result Date: 09/05/2019 CLINICAL DATA:  Hip replacement, pain EXAM: DG HIP (WITH OR WITHOUT PELVIS) 2-3V LEFT COMPARISON:  07/28/2018 FINDINGS: Post left total hip arthroplasty. No evidence of complication. Residual postoperative air in the soft tissues. IMPRESSION: Post total left hip arthroplasty without evidence of complication. Electronically Signed   By: Macy Mis M.D.   On: 09/05/2019 10:50            ASSESSMENT AND PLAN SYNOPSIS   Severe ACUTE Hypoxic and  Hypercapnic Respiratory Failure from severe acidosis ?ileus  biPAP as needed High risk for intubation  ASSESS FOR ACUTE ACS Check CE   KIDNEY INJURY/Renal Failure -follow chem 7 -follow UO -continue Foley Catheter-assess need -Avoid nephrotoxic agents ESRD on HD   NEUROLOGY Aspiration precautions   CARDIAC ICU monitoring  ID No abx at this time  GI GI PROPHYLAXIS as indicated  NUTRITIONAL STATUS DIET-->NPO Place NG to suction Constipation protocol as indicated   ENDO - will use ICU hypoglycemic\Hyperglycemia protocol if needed    ELECTROLYTES -follow labs as needed -replace as needed -pharmacy consultation and following   DVT/GI PRX ordered TRANSFUSIONS AS NEEDED MONITOR FSBS ASSESS the need for LABS    Critical Care Time devoted to patient care services described in this note is 43 minutes.   Overall, patient is critically ill, prognosis is guarded.  Patient with Multiorgan failure and at high risk for cardiac arrest and death.    Corrin Parker, M.D.  [facility]  Medical Director [place] Director [facility] Cardio-Pulmonary Department

## 2019-09-06 NOTE — Progress Notes (Signed)
Called into room bc of rapid response

## 2019-09-06 NOTE — Procedures (Addendum)
ELECTROENCEPHALOGRAM REPORT   Patient: Barbara [name]       Room #: IC01A-AA EEG No. ID: [id number] Age: 79 y.o.        Sex: female Requesting Physician: Kasa Report Date:  09/06/2019        Interpreting Physician: Alexis Goodell  History: [name] is an 79 y.o. female with new onset seizures and altered mental status  Medications:  Lipitor, Neurontin, Ditropan, Fentanyl, Neosynephrine  Conditions of Recording:  This is a 21 channel routine scalp EEG performed with bipolar and monopolar montages arranged in accordance to the international 10/20 system of electrode placement. One channel was dedicated to EKG recording.  The patient is in the intubated and sedated state.  Description:  The background activity is slow and poorly organized.  It consists of a low voltage polymorphic delta delta activity this is diffusely distributed.  There are intermittent generalized periods of attenuation noted as well occasionally during the recording.  These periods of attenuation are short, lasting up to 2 seconds.  Also, rare generalized polyspike activity is noted with frontal predominance.  Hyperventilation and intermittent photic stimulation were not performed.   IMPRESSION: This is an abnormal electroencephalogram secondary to discontinuous general background slowing with rare, superimposed, generalized polyspike activity with frontal predominance.  This suggest an underlying diffuse cerebral disturbance and frontal, cortical irritability.  No electrographic seizures are noted.     Alexis Goodell, MD Neurology [phone] 09/06/2019, 4:56 PM

## 2019-09-06 NOTE — Progress Notes (Signed)
Pt is still not responding to stimuli and Dr. Mortimer Fries is at bedside to intubate at this time. VSS.

## 2019-09-06 NOTE — Progress Notes (Signed)
HD Tx started and within 15 minutes, pt had some seizure activity and then needed to be intubated. Tx stopped and MD notified.

## 2019-09-06 NOTE — Progress Notes (Signed)
ABG results called to MD. 7.22/60/69/24.6  Vt increased to 450ML ABG in am 0500

## 2019-09-06 NOTE — Progress Notes (Signed)
[facility]  ROUNDING NOTE   Subjective:  Patient well-known to [place] from prior admissions. She came in for elective left hip repair. This was performed successfully. Patient due for dialysis today but did not tolerate this well as she developed a seizure. Subsequently intubated.   Objective:  Vital signs in last 24 hours:  Temp:  [97.5 F (36.4 C)-99.2 F (37.3 C)] 98.9 F (37.2 C) (02/17 1200) Pulse Rate:  [73-114] 93 (02/17 1400) Resp:  [16-20] 20 (02/17 1200) BP: (50-168)/(32-76) 142/47 (02/17 1400) SpO2:  [89 %-100 %] 99 % (02/17 1400) FiO2 (%):  [28 %-40 %] 40 % (02/17 1215) Weight:  [84.9 kg] 84.9 kg (02/17 0747)  Weight change:  Filed Weights   09/06/19 0747  Weight: 84.9 kg    Intake/Output: I/O last 3 completed shifts: In: 1836.9 [P.O.:410; I.V.:1075.5; IV Piggyback:351.5] Out: 200 [Blood:200]   Intake/Output this shift:  Total I/O In: 245.2 [I.V.:245.2] Out: 0   Physical Exam: General: Critically ill-appearing  Head: ETT in place  Eyes: Anicteric  Neck: Supple, trachea midline  Lungs:  Scattered rhonchi, vent assisted  Heart: S1S2 no rubs  Abdomen:  Soft, nontender, bowel sounds present  Extremities: trace peripheral edema.  Neurologic: Intubated, sedated  Skin: No lesions  Access: Left upper extremity AV graft    Basic Metabolic Panel: Recent Labs  Lab 09/05/19 0647 09/06/19 0532 09/06/19 0850  NA 140 138  --   K 4.2 3.8  --   CL 108 104  --   CO2  --  20*  --   GLUCOSE 93 119*  --   BUN 40* 49*  --   CREATININE 8.80* 10.14*  --   CALCIUM  --  7.6*  --   PHOS  --   --  8.8*    Liver Function Tests: No results for input(s): AST, ALT, ALKPHOS, BILITOT, PROT, ALBUMIN in the last 168 hours. No results for input(s): LIPASE, AMYLASE in the last 168 hours. No results for input(s): AMMONIA in the last 168 hours.  CBC: Recent Labs  Lab 09/05/19 0647 09/05/19 1435 09/06/19 0532  WBC  --  9.8 8.1  HGB 15.0 11.6* 10.5*   HCT 44.0 37.0 34.2*  MCV  --  99.7 100.3*  PLT  --  146* 132*    Cardiac Enzymes: No results for input(s): CKTOTAL, CKMB, CKMBINDEX, TROPONINI in the last 168 hours.  BNP: Invalid input(s): POCBNP  CBG: Recent Labs  Lab 09/05/19 0618 09/05/19 1022 09/06/19 0713 09/06/19 0749 09/06/19 1122  GLUCAP 85 95 111* 109* 101*    Microbiology: Results for orders placed or performed during the hospital encounter of 09/05/19  MRSA PCR Screening     Status: Abnormal   Collection Time: 09/06/19  7:50 AM   Specimen: Nasal Mucosa; Nasopharyngeal  Result Value Ref Range Status   MRSA by PCR POSITIVE (A) NEGATIVE Final    Comment:        The GeneXpert MRSA Assay (FDA approved for NASAL specimens only), is one component of a comprehensive MRSA colonization surveillance program. It is not intended to diagnose MRSA infection nor to guide or monitor treatment for MRSA infections. RESULT CALLED TO, READ BACK BY AND VERIFIED WITH: MEGAN SHEFFIELD AT 0931 ON 09/06/2019 [place]. Performed at [facility], [address] [place], [place] [address]     Coagulation Studies: No results for input(s): LABPROT, INR in the last 72 hours.  Urinalysis: No results for input(s): COLORURINE, LABSPEC, [place], [place], [place], [place],  KETONESUR, PROTEINUR, UROBILINOGEN, NITRITE, LEUKOCYTESUR in the last 72 hours.  Invalid input(s): APPERANCEUR    Imaging: DG Chest Port 1 View  Result Date: 09/06/2019 CLINICAL DATA:  Respiratory failure. EXAM: PORTABLE CHEST 1 VIEW COMPARISON:  CT chest 08/24/2019 and chest radiograph 05/11/2019. FINDINGS: Trachea is midline. Heart is enlarged, stable. Thoracic aorta is calcified. A stent is seen in the left brachiocephalic and subclavian veins. Lungs are low in volume which may accentuate pulmonary markings. No airspace consolidation or definite pleural fluid. IMPRESSION: 1. Low lung volumes likely accentuate pulmonary markings. Difficult to  definitively exclude pulmonary edema. 2.  Aortic atherosclerosis (ICD10-I70.0). Electronically Signed   By: Lorin Picket M.D.   On: 09/06/2019 09:11   DG HIP UNILAT W OR W/O PELVIS 2-3 VIEWS LEFT  Result Date: 09/05/2019 CLINICAL DATA:  Hip replacement, pain EXAM: DG HIP (WITH OR WITHOUT PELVIS) 2-3V LEFT COMPARISON:  07/28/2018 FINDINGS: Post left total hip arthroplasty. No evidence of complication. Residual postoperative air in the soft tissues. IMPRESSION: Post total left hip arthroplasty without evidence of complication. Electronically Signed   By: Macy Mis M.D.   On: 09/05/2019 10:50     Medications:   . sodium chloride Stopped (09/06/19 0721)  . sodium chloride    . sodium chloride    . sodium chloride    . fentaNYL infusion INTRAVENOUS 50 mcg/hr (09/06/19 1348)  . phenylephrine (NEO-SYNEPHRINE) Adult infusion 25 mcg/min (09/06/19 1257)  .  sodium bicarbonate (isotonic) infusion in sterile water Stopped (09/06/19 1239)   . ascorbic acid  500 mg Oral BID  . atorvastatin  40 mg Oral QPM  . calcium acetate  1,334 mg Oral TID WC  . calcium carbonate  1 tablet Oral Daily  . chlorhexidine gluconate (MEDLINE KIT)  15 mL Mouth Rinse BID  . Chlorhexidine Gluconate Cloth  6 each Topical Q0600  . cholecalciferol  1,000 Units Oral Daily  . cinacalcet  30 mg Oral Daily  . docusate sodium  100 mg Oral BID  . ferrous sulfate  325 mg Oral QPC breakfast  . gabapentin  600 mg Oral TID  . heparin injection (subcutaneous)  5,000 Units Subcutaneous Q8H  . mouth rinse  15 mL Mouth Rinse 10 times per day  . oxybutynin  5 mg Oral TID  . pantoprazole  40 mg Oral Daily   sodium chloride, sodium chloride, acetaminophen, albuterol, alteplase, bisacodyl, diphenhydrAMINE, heparin, hydrALAZINE, HYDROcodone-acetaminophen, lidocaine (PF), lidocaine-prilocaine, loperamide HCl, magnesium hydroxide, metoCLOPramide **OR** metoCLOPramide (REGLAN) injection, morphine injection, ondansetron **OR**  ondansetron (ZOFRAN) IV, pentafluoroprop-tetrafluoroeth, sodium phosphate, traMADol  Assessment/ Plan:  79 y.o. female with end stage renal disease on hemodialysis, hypertension, diabetes mellitus type II, diabetic neuropathy who was admitted for left hip repair and unfortunate developed seizure episode postoperatively and subsequently intubated.  [facility] MWF-1/[facility]/LUE AVG  1.  ESRD on HD MWF.  Patient did undergo partial dialysis treatment however developed a seizure disorder during the treatment.  Therefore treatment was discontinued.  We will reevaluate the patient tomorrow as well as Friday for stability to determine if we can resume dialysis treatment.  2.  Acute respiratory failure.  Developed seizure disorder and it was felt that she could not protect her airway.  Now intubated.  Continue mechanical ventilation.  3.  Anemia of chronic kidney disease.  Hemoglobin 10.5.  Hold off on Epogen at this time.  4.  Secondary hyperparathyroidism.  Phosphorus quite high at 8.8.  We evaluate bone metabolism parameters with next dialysis treatment.  Resume calcium  acetate once she is able to take p.o.     LOS: 1 Shneur [place] 2/17/20212:14 PM

## 2019-09-06 NOTE — Consult Note (Signed)
Reason for Consult:AMS Requesting Physician: Kasa  CC: AMS  I have been asked by Dr. Mortimer Fries to see this patient in consultation for AMS.  HPI: Barbara [name] is an 79 y.o. female with multiple medical problems including ESRD on hemodialysis who was admitted 2/16 for left total hip arthroplasty.  This morning rapid response was called for severe respiratory distress, severe metabolic acidosis and encephalopathy.  Patient did not respond to sternal rub, pH was 7.16.  Patient placed on BiPAP and seemed to improve somewhat.  While on dialysis had seizure activity and required intubation.  Patient has had episodes in the past when she became unresponsive while ill.  On each occasion patient required prolonged time for recovery.  No previous seizure activity noted.    Past Medical History:  Diagnosis Date  . Adult celiac disease   . Anemia associated with chronic renal failure 2017   blood transfusion last week 10/17  . Arthritis   . Asthma   . 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 Chronic kidney disease   . 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  dr. Candiss Norse and dr. Holley Raring follow her  . COPD (chronic obstructive pulmonary disease) ([place])   . Coronary artery disease   . Diabetes mellitus without complication ([place])   . Dialysis patient ([place])    Tues, Thurs, Sat  . Dyspnea    with exertion  . Elevated lipids   . GERD (gastroesophageal reflux disease)   . Hematuria   . Hypertension   . Lower back pain   . Neuropathy   . Oxygen dependent    at hs  . Personal history of chemotherapy 2017   bladder ca  . Personal history of radiation therapy 2017   bladder ca  . Sleep apnea    uses cpap  . Urinary obstruction 01/2016    Past Surgical History:  Procedure Laterality Date  . A/V SHUNT INTERVENTION N/A 05/12/2018   Procedure: A/V SHUNT INTERVENTION;  Surgeon: Algernon Huxley, MD;  Location: [facility];  Service: Cardiovascular;  Laterality: N/A;  . A/V SHUNT INTERVENTION  Left 07/29/2018   Procedure: A/V SHUNT INTERVENTION;  Surgeon: Katha Cabal, MD;  Location: [facility];  Service: Cardiovascular;  Laterality: Left;  . ABDOMINAL HYSTERECTOMY    . AV FISTULA PLACEMENT Left 02/17/2017   Procedure: INSERTION OF ARTERIOVENOUS (AV) GORE-TEX GRAFT ARM(BRACHIAL AXILLARY);  Surgeon: Katha Cabal, MD;  Location: [facility];  Service: Vascular;  Laterality: Left;  . CYSTOSCOPY W/ RETROGRADES Bilateral 02/17/2016   Procedure: CYSTOSCOPY WITH RETROGRADE PYELOGRAM;  Surgeon: Hollice Espy, MD;  Location: [facility];  Service: Urology;  Laterality: Bilateral;  . CYSTOSCOPY W/ RETROGRADES Bilateral 10/12/2016   Procedure: CYSTOSCOPY WITH RETROGRADE PYELOGRAM;  Surgeon: Hollice Espy, MD;  Location: [facility];  Service: Urology;  Laterality: Bilateral;  . CYSTOSCOPY W/ RETROGRADES Bilateral 06/09/2017   Procedure: CYSTOSCOPY WITH RETROGRADE PYELOGRAM;  Surgeon: Hollice Espy, MD;  Location: [facility];  Service: Urology;  Laterality: Bilateral;  . CYSTOSCOPY W/ RETROGRADES Bilateral 10/07/2017   Procedure: CYSTOSCOPY WITH RETROGRADE PYELOGRAM;  Surgeon: Hollice Espy, MD;  Location: [facility];  Service: Urology;  Laterality: Bilateral;  . CYSTOSCOPY W/ URETERAL STENT PLACEMENT Left 05/12/2016   Procedure: CYSTOSCOPY WITH STENT REPLACEMENT;  Surgeon: Hollice Espy, MD;  Location: [facility];  Service: Urology;  Laterality: Left;  . CYSTOSCOPY W/ URETERAL STENT PLACEMENT Left 10/12/2016   Procedure: CYSTOSCOPY WITH STENT REPLACEMENT;  Surgeon: Caryl Pina  Erlene Quan, MD;  Location: [facility];  Service: Urology;  Laterality: Left;  . CYSTOSCOPY WITH BIOPSY N/A 06/09/2017   Procedure: CYSTOSCOPY WITH BLADDER BIOPSY;  Surgeon: Hollice Espy, MD;  Location: [facility];  Service: Urology;  Laterality: N/A;  . CYSTOSCOPY WITH STENT PLACEMENT Left 01/21/2016   Procedure: CYSTOSCOPY WITH double J STENT PLACEMENT;  Surgeon: Franchot Gallo, MD;  Location: [facility];  Service: Urology;   Laterality: Left;  . CYSTOSCOPY WITH STENT PLACEMENT Right 10/12/2016   Procedure: CYSTOSCOPY WITH STENT PLACEMENT;  Surgeon: Hollice Espy, MD;  Location: [facility];  Service: Urology;  Laterality: Right;  . DIALYSIS/PERMA CATHETER INSERTION N/A 11/20/2016   Procedure: Dialysis/Perma Catheter Insertion;  Surgeon: Algernon Huxley, MD;  Location: [facility];  Service: Cardiovascular;  Laterality: N/A;  . DIALYSIS/PERMA CATHETER REMOVAL N/A 03/30/2017   Procedure: DIALYSIS/PERMA CATHETER REMOVAL;  Surgeon: Katha Cabal, MD;  Location: [facility];  Service: Cardiovascular;  Laterality: N/A;  . KIDNEY SURGERY  01/21/2016   IR NEPHROSTOMY PLACEMENT LEFT   . PERIPHERAL VASCULAR CATHETERIZATION N/A 04/07/2016   Procedure: Glori Luis Cath Insertion;  Surgeon: Katha Cabal, MD;  Location: [facility];  Service: Cardiovascular;  Laterality: N/A;  . PERIPHERAL VASCULAR THROMBECTOMY Left 06/02/2017   Procedure: PERIPHERAL VASCULAR THROMBECTOMY;  Surgeon: Algernon Huxley, MD;  Location: [facility];  Service: Cardiovascular;  Laterality: Left;  . PERIPHERAL VASCULAR THROMBECTOMY Left 01/28/2018   Procedure: PERIPHERAL VASCULAR THROMBECTOMY;  Surgeon: Katha Cabal, MD;  Location: [facility];  Service: Cardiovascular;  Laterality: Left;  . PERIPHERAL VASCULAR THROMBECTOMY Left 03/14/2018   Procedure: PERIPHERAL VASCULAR THROMBECTOMY;  Surgeon: Algernon Huxley, MD;  Location: [facility];  Service: Cardiovascular;  Laterality: Left;  . PERIPHERAL VASCULAR THROMBECTOMY Left 03/16/2018   Procedure: PERIPHERAL VASCULAR THROMBECTOMY;  Surgeon: Katha Cabal, MD;  Location: [facility];  Service: Cardiovascular;  Laterality: Left;  . PORTA CATH REMOVAL N/A 02/07/2019   Procedure: PORTA CATH REMOVAL;  Surgeon: Katha Cabal, MD;  Location: [facility];  Service: Cardiovascular;  Laterality: N/A;  . PORTACATH PLACEMENT Right   . ROTATOR  CUFF REPAIR Left   . TEMPORARY DIALYSIS CATHETER N/A 05/11/2018   Procedure: TEMPORARY DIALYSIS CATHETER;  Surgeon: Katha Cabal, MD;  Location: [facility];  Service: Cardiovascular;  Laterality: N/A;  . TEMPORARY DIALYSIS CATHETER Left 07/27/2018   Procedure: TEMPORARY DIALYSIS CATHETER;  Surgeon: Katha Cabal, MD;  Location: [facility];  Service: Cardiovascular;  Laterality: Left;  . TOTAL HIP ARTHROPLASTY Left 09/05/2019   Procedure: TOTAL HIP ARTHROPLASTY;  Surgeon: Corky Mull, MD;  Location: [facility];  Service: Orthopedics;  Laterality: Left;  . TRANSURETHRAL RESECTION OF BLADDER TUMOR N/A 02/17/2016   Procedure: TRANSURETHRAL RESECTION OF BLADDER TUMOR (TURBT)-LARGE;  Surgeon: Hollice Espy, MD;  Location: [facility];  Service: Urology;  Laterality: N/A;  . TRANSURETHRAL RESECTION OF BLADDER TUMOR N/A 10/07/2017   Procedure: TRANSURETHRAL RESECTION OF BLADDER TUMOR (TURBT)-small;  Surgeon: Hollice Espy, MD;  Location: [facility];  Service: Urology;  Laterality: N/A;  . URETEROSCOPY Left 02/17/2016   Procedure: URETEROSCOPY;  Surgeon: Hollice Espy, MD;  Location: [facility];  Service: Urology;  Laterality: Left;  . URETEROSCOPY Right 10/12/2016   Procedure: URETEROSCOPY;  Surgeon: Hollice Espy, MD;  Location: [facility];  Service: Urology;  Laterality: Right;    Family History  Problem Relation Age of Onset  . Diabetes Mother   .  Cancer Father   . Breast cancer Neg Hx   . Kidney disease Neg Hx     Social History:  reports that she quit smoking about 17 years ago. Her smoking use included cigarettes. She has never used smokeless tobacco. She reports that she does not drink alcohol or use drugs.  Allergies  Allergen Reactions  . Glipizide Er Other (See Comments)    Hypoglycemia   . Percocet [Oxycodone-Acetaminophen] Hives    Medications:  I have reviewed the patient's current medications. Prior to Admission:  Medications Prior to Admission   Medication Sig Dispense Refill Last Dose  . acetaminophen (TYLENOL) 500 MG tablet Take 1,000 mg by mouth 2 (two) times daily as needed for moderate pain or headache.    09/04/2019 at Unknown time  . albuterol (PROVENTIL HFA;VENTOLIN HFA) 108 (90 Base) MCG/ACT inhaler Inhale 2 puffs into the lungs every 6 (six) hours as needed for wheezing or shortness of breath.    09/05/2019 at Unknown time  . atorvastatin (LIPITOR) 40 MG tablet Take 40 mg by mouth every evening.   09/04/2019 at Unknown time  . calcium acetate (PHOSLO) 667 MG capsule Take 2 capsules (1,334 mg total) by mouth 3 (three) times daily with meals. (Patient taking differently: Take 1,334 mg by mouth 3 (three) times daily. ) 90 capsule 0 09/04/2019 at Unknown time  . calcium carbonate (TUMS - DOSED IN MG ELEMENTAL CALCIUM) 500 MG chewable tablet Chew 1 tablet by mouth daily.   09/04/2019 at Unknown time  . cholecalciferol (VITAMIN D) 1000 units tablet Take 1,000 Units by mouth daily.   09/04/2019 at Unknown time  . cinacalcet (SENSIPAR) 30 MG tablet Take 30 mg by mouth daily. With largest meal   09/04/2019 at Unknown time  . esomeprazole (NEXIUM) 40 MG capsule Take 40 mg by mouth daily after breakfast.    09/04/2019 at Unknown time  . ferrous sulfate 325 (65 FE) MG EC tablet Take 325 mg by mouth daily after breakfast.    09/04/2019 at Unknown time  . furosemide (LASIX) 40 MG tablet Take 40 mg by mouth daily.    09/04/2019 at Unknown time  . gabapentin (NEURONTIN) 300 MG capsule Take 600 mg by mouth 3 (three) times daily.   09/04/2019 at Unknown time  . ibuprofen (ADVIL) 200 MG tablet Take 200 mg by mouth every 8 (eight) hours as needed (for pain.).    Past Week at Unknown time  . loperamide (IMODIUM) 1 MG/5ML solution Take 2 mg by mouth as needed for diarrhea or loose stools.   09/04/2019 at Unknown time  . losartan (COZAAR) 50 MG tablet Take 50 mg by mouth every evening.   09/04/2019 at Unknown time  . multivitamin (RENA-VIT) TABS tablet Take 1 tablet  by mouth at bedtime.  0 09/04/2019 at Unknown time  . oxybutynin (DITROPAN) 5 MG tablet Take 5 mg by mouth 3 (three) times daily.   09/04/2019 at Unknown time  . vitamin C (VITAMIN C) 500 MG tablet Take 1 tablet (500 mg total) by mouth 2 (two) times daily.   09/04/2019 at Unknown time  . Blood Glucose Monitoring Suppl (ACCU-CHEK AVIVA PLUS) w/Device KIT (USE TO MONITOR BLOOD SUGARS.)  0   . NON FORMULARY Place 1 Units into the nose at bedtime. CPAP Time of use 2100-0600     . Nutritional Supplements (FEEDING SUPPLEMENT, NEPRO CARB STEADY,) LIQD Take 237 mLs by mouth 2 (two) times daily between meals.  0   . OXYGEN Inhale 2  L into the lungs at bedtime.      Scheduled: . ascorbic acid  500 mg Oral BID  . atorvastatin  40 mg Oral QPM  . calcium acetate  1,334 mg Oral TID WC  . calcium carbonate  1 tablet Oral Daily  . chlorhexidine gluconate (MEDLINE KIT)  15 mL Mouth Rinse BID  . Chlorhexidine Gluconate Cloth  6 each Topical Q0600  . cholecalciferol  1,000 Units Oral Daily  . cinacalcet  30 mg Oral Daily  . docusate sodium  100 mg Oral BID  . ferrous sulfate  325 mg Oral QPC breakfast  . gabapentin  600 mg Oral TID  . heparin injection (subcutaneous)  5,000 Units Subcutaneous Q8H  . mouth rinse  15 mL Mouth Rinse 10 times per day  . oxybutynin  5 mg Oral TID  . pantoprazole  40 mg Oral Daily    ROS: Unable to provide due to mental status  Physical Examination: Blood pressure (!) 140/57, pulse 88, temperature 98.5 F (36.9 C), temperature source Axillary, resp. rate 16, height _0  (1.676 m), weight 84.9 kg, SpO2 99 %.  HEENT-  Normocephalic, no lesions, without obvious abnormality.  Normal external eye and conjunctiva.  Normal TM's bilaterally.  Normal auditory canals and external ears. Normal external nose, mucus membranes and septum.  Normal pharynx. Cardiovascular- S1, S2 normal, pulses palpable throughout   Lungs- chest clear, no wheezing, rales, normal symmetric air  entry Abdomen- soft, non-tender; bowel sounds normal; no masses,  no organomegaly Extremities- no edema Lymph-no adenopathy palpable Musculoskeletal-no joint tenderness, deformity or swelling Skin-warm and dry, no hyperpigmentation, vitiligo, or suspicious lesions  Neurological Examination   Mental Status: Patient does not respond to verbal stimuli.  Grimaces to deep sternal rub. Does not follow commands  Cranial Nerves: II: patient does not respond confrontation bilaterally, pupils right 2 mm, left 2 mm,and reactive bilaterally III,IV,VI: Oculocephalic response absent bilaterally but appears to move eyes purposefully at times V,VII: corneal reflex present bilaterally  VIII: patient does not respond to verbal stimuli IX,X: gag reflex reduced, XI: trapezius strength unable to test bilaterally XII: tongue strength unable to test Motor: Minimal movement noted in all extremities Sensory: Does not respond to noxious stimuli in any extremity. Deep Tendon Reflexes:  Symmetric throughout. Plantars: Mute bilaterally Cerebellar: Unable to perform  Laboratory Studies:   Basic Metabolic Panel: Recent Labs  Lab 09/05/19 0647 09/06/19 0532 09/06/19 0850  NA 140 138  --   K 4.2 3.8  --   CL 108 104  --   CO2  --  20*  --   GLUCOSE 93 119*  --   BUN 40* 49*  --   CREATININE 8.80* 10.14*  --   CALCIUM  --  7.6*  --   PHOS  --   --  8.8*    Liver Function Tests: No results for input(s): AST, ALT, ALKPHOS, BILITOT, PROT, ALBUMIN in the last 168 hours. No results for input(s): LIPASE, AMYLASE in the last 168 hours. No results for input(s): AMMONIA in the last 168 hours.  CBC: Recent Labs  Lab 09/05/19 0647 09/05/19 1435 09/06/19 0532  WBC  --  9.8 8.1  HGB 15.0 11.6* 10.5*  HCT 44.0 37.0 34.2*  MCV  --  99.7 100.3*  PLT  --  146* 132*    Cardiac Enzymes: No results for input(s): CKTOTAL, CKMB, CKMBINDEX, TROPONINI in the last 168 hours.  BNP: Invalid input(s):  POCBNP  CBG: Recent Labs  Lab  09/05/19 0618 09/05/19 1022 09/06/19 0713 09/06/19 0749 09/06/19 1122  GLUCAP 85 95 111* 109* 101*    Microbiology: Results for orders placed or performed during the hospital encounter of 09/05/19  MRSA PCR Screening     Status: Abnormal   Collection Time: 09/06/19  7:50 AM   Specimen: Nasal Mucosa; Nasopharyngeal  Result Value Ref Range Status   MRSA by PCR POSITIVE (A) NEGATIVE Final    Comment:        The GeneXpert MRSA Assay (FDA approved for NASAL specimens only), is one component of a comprehensive MRSA colonization surveillance program. It is not intended to diagnose MRSA infection nor to guide or monitor treatment for MRSA infections. RESULT CALLED TO, READ BACK BY AND VERIFIED WITH: MEGAN SHEFFIELD AT 0931 ON 09/06/2019 [place]. Performed at [facility], [address] [place], [place] [address]     Coagulation Studies: No results for input(s): LABPROT, INR in the last 72 hours.  Urinalysis: No results for input(s): COLORURINE, LABSPEC, PHURINE, GLUCOSEU, HGBUR, BILIRUBINUR, KETONESUR, PROTEINUR, UROBILINOGEN, NITRITE, LEUKOCYTESUR in the last 168 hours.  Invalid input(s): APPERANCEUR  Lipid Panel:     Component Value Date/Time   CHOL 100 08/03/2012 0250   TRIG 235 (H) 08/03/2012 0250   HDL 31 (L) 08/03/2012 0250   VLDL 47 (H) 08/03/2012 0250   LDLCALC 22 08/03/2012 0250    HgbA1C:  Lab Results  Component Value Date   HGBA1C 6.2 (H) 05/10/2018    Urine Drug Screen:  No results found for: LABOPIA, COCAINSCRNUR, LABBENZ, AMPHETMU, THCU, LABBARB  Alcohol Level: No results for input(s): ETH in the last 168 hours.  Imaging: DG Chest 1 View  Result Date: 09/06/2019 CLINICAL DATA:  Status post intubation. EXAM: CHEST  1 VIEW COMPARISON:  Single-view of the chest earlier today. FINDINGS: New endotracheal tube is in place with the tip in good position 2.5 cm above the carina. NG tube courses into the stomach and  below the inferior margin the film. Bibasilar atelectasis again seen. There is cardiomegaly. Vascular stent noted. IMPRESSION: ETT in good position. No other change compared to the exam earlier today. Electronically Signed   By: Inge Rise M.D.   On: 09/06/2019 14:22   DG Abd 1 View  Result Date: 09/06/2019 CLINICAL DATA:  OG tube placement EXAM: ABDOMEN - 1 VIEW COMPARISON:  None. FINDINGS: Enteric tube passes into the stomach. Bowel gas pattern is unremarkable. IMPRESSION: Enteric tube is within the stomach. Electronically Signed   By: Macy Mis M.D.   On: 09/06/2019 14:21   DG Chest Port 1 View  Result Date: 09/06/2019 CLINICAL DATA:  Respiratory failure. EXAM: PORTABLE CHEST 1 VIEW COMPARISON:  CT chest 08/24/2019 and chest radiograph 05/11/2019. FINDINGS: Trachea is midline. Heart is enlarged, stable. Thoracic aorta is calcified. A stent is seen in the left brachiocephalic and subclavian veins. Lungs are low in volume which may accentuate pulmonary markings. No airspace consolidation or definite pleural fluid. IMPRESSION: 1. Low lung volumes likely accentuate pulmonary markings. Difficult to definitively exclude pulmonary edema. 2.  Aortic atherosclerosis (ICD10-I70.0). Electronically Signed   By: Lorin Picket M.D.   On: 09/06/2019 09:11   DG HIP UNILAT W OR W/O PELVIS 2-3 VIEWS LEFT  Result Date: 09/05/2019 CLINICAL DATA:  Hip replacement, pain EXAM: DG HIP (WITH OR WITHOUT PELVIS) 2-3V LEFT COMPARISON:  07/28/2018 FINDINGS: Post left total hip arthroplasty. No evidence of complication. Residual postoperative air in the soft tissues. IMPRESSION: Post total left hip arthroplasty without evidence  of complication. Electronically Signed   By: Macy Mis M.D.   On: 09/05/2019 10:50     Assessment/Plan: 79 year old female with multiple medical problems including ESRD on hemodialysis who was admitted 2/16 for left total hip arthroplasty.  This morning rapid response was called for  severe respiratory distress, severe metabolic acidosis and encephalopathy.  Patient did not respond to sternal rub, pH was 7.16.  Patient placed on BiPAP and seemed to improve somewhat.  While on dialysis had seizure activity and required intubation.  Patient has had episodes in the past when she became unresponsive with illnesses and required extensive amount of time for recovery.  No past history of seizures.  EEG reveals some evidence of frontal cortical irritability.  No evidence of electrographic seizure activity.  Although stroke is on the differential for this patient, she may also be just responding to stressors from surgery and metabolic acidosis.  Seizure may have been provoked as well.  If this is the case, as in the past, patient will likely require a prolonged period of time for recovery.  Phosphorus elevated at 8.8.    Recommendations: 1. Vimpat 271m IV load with maintenance of 107mIV q 12 hours 2. Seizure precautions 3. MRI of the brain without contrast 4. Serum magnesium 5. Agree with addressing medical issues   LeAlexis GoodellMD Neurology [phone]/17/2021, 4:11 PM

## 2019-09-06 NOTE — Progress Notes (Signed)
eeg completed ° °

## 2019-09-06 NOTE — Progress Notes (Signed)
After further evaluation and assessment of the patient, patient with acute unresponsiveness along with seizure-like activity with severe progressive metabolic acidosis.  Patient with increased work of breathing failing BiPAP patient was emergently intubated and placed on sedation  Patient was started on hemodialysis to assist with acidosis  At this time I have discussed the case with the family Patient was made a DNR status.  At this time due to the acute unresponsiveness and seizure-like activity, I will need to obtain full neurological work-up to assess for acute CVA and acute seizure-like activity      Corrin Parker, M.D.  [facility]  Medical Director [place] Director [facility] Cardio-Pulmonary Department

## 2019-09-06 NOTE — Significant Event (Signed)
Rapid Response Event Note  Overview: Time Called: 0710 Event Type: Respiratory  Initial Focused Assessment: Our Assistant Director Roselyn Reef- went on rapid response.  Per Roselyn Reef patient found unresponsive to sternal rub at shift change- Patient has sleep apnea but did not wear a CPAP at night.      Interventions: ABG was drawn-Patient transferred to ICU for bipap-  Plan of Care (if not transferred):  Event Summary:   at      at          [facility]

## 2019-09-06 NOTE — Progress Notes (Addendum)
PT Cancellation Note  Patient Details Name: Barbara [name] MRN: [id number] DOB: 07-Jan-1941   Cancelled Treatment:     PT medical hold. Per chart review, pt noted to have had rapid response this AM, has transitioned to higher level care. Per therapy protocols, will require new orders or continue at transfer to initiate therapy services. Will follow acutely and initiate services as pt medically appropriate.    Willette Pa 09/06/2019, 10:29 AM

## 2019-09-06 NOTE — Progress Notes (Signed)
Called spouse of transfer off of floor

## 2019-09-06 NOTE — Progress Notes (Signed)
Family At bedside, clinical status relayed to family  Updated and notified of patients medical condition-  Progressive multiorgan failure with very low chance of meaningful recovery.  Family understands the situation.  They have consented and agreed to DNR/DNI   Family are satisfied with Plan of action and management. All questions answered  Corrin Parker, M.D.  [facility]  Medical Director [place] Director [facility] Cardio-Pulmonary Department

## 2019-09-06 NOTE — Progress Notes (Signed)
Pt's HR dropped to 28, Dr. Mortimer Fries aware. Order received for 43m of atropine. Atropine given and HR immediately improved to 92.

## 2019-09-06 NOTE — Progress Notes (Signed)
Pt started having seizure like activity and RN was called into room by dialysis nurse. Pt was jerking and eyes were twitching. This lasted about 30 seconds. Pupils are responsive but sluggish and pt is no longer responding. Dr. Mortimer Fries notified.

## 2019-09-06 NOTE — Op Note (Signed)
  OPERATIVE NOTE   PROCEDURE: 1. Insertion of temporary dialysis catheter catheter right femoral approach.  PRE-OPERATIVE DIAGNOSIS: End-stage renal disease; critical illness requiring pressors and multiple parenteral medications  POST-OPERATIVE DIAGNOSIS: Same  SURGEON: Katha Cabal M.D.  ANESTHESIA: 1% lidocaine local infiltration  ESTIMATED BLOOD LOSS: Minimal cc  INDICATIONS:   Barbara [name] is a 79 y.o. female who presents with deteriorating status.  She is now in the ICU intubated and on multiple pressors.  She requires appropriate parenteral access which is likely to include CRRT.  Given this we will place a Trialysis catheter rather than a triple-lumen catheter.  DESCRIPTION: After obtaining full informed written consent, the patient was positioned supine. The right groin was prepped and draped in a sterile fashion. Ultrasound was placed in a sterile sleeve. Ultrasound was utilized to identify the right common femoral vein which is noted to be echolucent and compressible indicating patency. Images recorded for the permanent record. Under real-time visualization a Seldinger needle is inserted into the vein and the guidewires advanced without difficulty. Small counterincision was made at the wire insertion site. Dilator is passed over the wire and the temporary dialysis catheter catheter is fed over the wire without difficulty.  All lumens aspirate and flush easily and are packed with heparin saline. Catheter secured to the skin of the right thigh with 2-0 silk. A sterile dressing is applied with Biopatch.  COMPLICATIONS: None  CONDITION: Unchanged  [name] Office:  [phone] 09/06/2019, 2:51 PM

## 2019-09-06 NOTE — Progress Notes (Signed)
OT Cancellation Note  Patient Details Name: SHARYAH BOSTWICK MRN: [id number] DOB: 1940-08-06   Cancelled Treatment:    Reason Eval/Treat Not Completed: Medical issues which prohibited therapy;Other (comment) Thanks for the OT consult. Order received and chart reviewed. Per chart review, pt noted to have had rapid response this AM, has transitioned to higher level care. Per therapy protocols, will require new orders or continue at transfer to initiate therapy services. Will follow acutely and initiate services as pt medically appropriate.  Dessie Coma, M.S. OTR/L  09/06/19, 8:26 AM

## 2019-09-06 NOTE — Progress Notes (Signed)
Per RR nurse order for Blood gas

## 2019-09-07 ENCOUNTER — Inpatient Hospital Stay: Payer: Medicare Other

## 2019-09-07 ENCOUNTER — Inpatient Hospital Stay (HOSPITAL_COMMUNITY)
Admission: RE | Admit: 2019-09-07 | Discharge: 2019-09-07 | Disposition: A | Discharge status: Home / Self Care | Payer: Medicare Other | Source: Home / Self Care | Attending: Internal Medicine | Admitting: Internal Medicine

## 2019-09-07 DIAGNOSIS — J9601 Acute respiratory failure with hypoxia: Secondary | ICD-10-CM

## 2019-09-07 DIAGNOSIS — J9602 Acute respiratory failure with hypercapnia: Secondary | ICD-10-CM

## 2019-09-07 LAB — BASIC METABOLIC PANEL
Anion gap: 16 — ABNORMAL HIGH (ref 5–15)
BUN: 49 mg/dL — ABNORMAL HIGH (ref 8–23)
CO2: 25 mmol/L (ref 22–32)
Calcium: 7.4 mg/dL — ABNORMAL LOW (ref 8.9–10.3)
Chloride: 95 mmol/L — ABNORMAL LOW (ref 98–111)
Creatinine, Ser: 10.09 mg/dL — ABNORMAL HIGH (ref 0.44–1.00)
GFR calc Af Amer: 4 mL/min — ABNORMAL LOW (ref >60)
GFR calc non Af Amer: 3 mL/min — ABNORMAL LOW (ref >60)
Glucose, Bld: 122 mg/dL — ABNORMAL HIGH (ref 70–99)
Potassium: 2.9 mmol/L — ABNORMAL LOW (ref 3.5–5.1)
Sodium: 136 mmol/L (ref 135–145)

## 2019-09-07 LAB — GLUCOSE, CAPILLARY
Glucose-Capillary: 63 mg/dL — ABNORMAL LOW (ref 70–99)
Glucose-Capillary: 109 mg/dL — ABNORMAL HIGH (ref 70–99)
Glucose-Capillary: 59 mg/dL — ABNORMAL LOW (ref 70–99)
Glucose-Capillary: 89 mg/dL (ref 70–99)
Glucose-Capillary: 72 mg/dL (ref 70–99)
Glucose-Capillary: 111 mg/dL — ABNORMAL HIGH (ref 70–99)
Glucose-Capillary: 136 mg/dL — ABNORMAL HIGH (ref 70–99)

## 2019-09-07 LAB — BLOOD GAS, ARTERIAL
Acid-Base Excess: 0.5 mmol/L (ref 0.0–2.0)
Acid-base deficit: 2.2 mmol/L — ABNORMAL HIGH (ref 0.0–2.0)
Bicarbonate: 25.4 mmol/L (ref 20.0–28.0)
Bicarbonate: 28.4 mmol/L — ABNORMAL HIGH (ref 20.0–28.0)
FIO2: 0.4
FIO2: 0.4
MECHVT: 450 mL
MECHVT: 15 mL
O2 Saturation: 97.9 %
O2 Saturation: 91.6 %
PEEP: 5 cmH2O
PEEP: 5 cmH2O
Patient temperature: 37
Patient temperature: 37
RATE: 15 resp/min
RATE: 400 resp/min
pCO2 arterial: 54 mmHg — ABNORMAL HIGH (ref 32.0–48.0)
pCO2 arterial: 59 mmHg — ABNORMAL HIGH (ref 32.0–48.0)
pH, Arterial: 7.28 — ABNORMAL LOW (ref 7.350–7.450)
pH, Arterial: 7.29 — ABNORMAL LOW (ref 7.350–7.450)
pO2, Arterial: 114 mmHg — ABNORMAL HIGH (ref 83.0–108.0)
pO2, Arterial: 70 mmHg — ABNORMAL LOW (ref 83.0–108.0)

## 2019-09-07 LAB — RENAL FUNCTION PANEL
Albumin: 2.8 g/dL — ABNORMAL LOW (ref 3.5–5.0)
Anion gap: 20 — ABNORMAL HIGH (ref 5–15)
BUN: 58 mg/dL — ABNORMAL HIGH (ref 8–23)
CO2: 23 mmol/L (ref 22–32)
Calcium: 7.5 mg/dL — ABNORMAL LOW (ref 8.9–10.3)
Chloride: 91 mmol/L — ABNORMAL LOW (ref 98–111)
Creatinine, Ser: 10.66 mg/dL — ABNORMAL HIGH (ref 0.44–1.00)
GFR calc Af Amer: 4 mL/min — ABNORMAL LOW (ref >60)
GFR calc non Af Amer: 3 mL/min — ABNORMAL LOW (ref >60)
Glucose, Bld: 133 mg/dL — ABNORMAL HIGH (ref 70–99)
Phosphorus: 9.1 mg/dL — ABNORMAL HIGH (ref 2.5–4.6)
Potassium: 3.3 mmol/L — ABNORMAL LOW (ref 3.5–5.1)
Sodium: 134 mmol/L — ABNORMAL LOW (ref 135–145)

## 2019-09-07 LAB — ECHOCARDIOGRAM COMPLETE
Height: 66 in
Weight: 2994.73 oz

## 2019-09-07 LAB — CBC
HCT: 34.5 % — ABNORMAL LOW (ref 36.0–46.0)
Hemoglobin: 10.9 g/dL — ABNORMAL LOW (ref 12.0–15.0)
MCH: 31.6 pg (ref 26.0–34.0)
MCHC: 31.6 g/dL (ref 30.0–36.0)
MCV: 100 fL (ref 80.0–100.0)
Platelets: 120 10*3/uL — ABNORMAL LOW (ref 150–400)
RBC: 3.45 MIL/uL — ABNORMAL LOW (ref 3.87–5.11)
RDW: 14.6 % (ref 11.5–15.5)
WBC: 11.9 10*3/uL — ABNORMAL HIGH (ref 4.0–10.5)
nRBC: 0 % (ref 0.0–0.2)

## 2019-09-07 LAB — PARATHYROID HORMONE, INTACT (NO CA): PTH: 310 pg/mL — ABNORMAL HIGH (ref 15–65)

## 2019-09-07 LAB — MAGNESIUM
Magnesium: 1.8 mg/dL (ref 1.7–2.4)
Magnesium: 2.3 mg/dL (ref 1.7–2.4)

## 2019-09-07 LAB — POTASSIUM: Potassium: 4.2 mmol/L (ref 3.5–5.1)

## 2019-09-07 LAB — PHOSPHORUS: Phosphorus: 8.9 mg/dL — ABNORMAL HIGH (ref 2.5–4.6)

## 2019-09-07 MED ORDER — DEXTROSE 50 % IV SOLN
1.0000 | Freq: Once | INTRAVENOUS | Status: DC
  Filled 2019-09-07: qty 50

## 2019-09-07 MED ORDER — MAGNESIUM SULFATE 2 GM/50ML IV SOLN
2.0000 g | Freq: Once | INTRAVENOUS | Status: AC
  Administered 2019-09-07: 2 g via INTRAVENOUS
  Filled 2019-09-07: qty 50

## 2019-09-07 MED ORDER — PRISMASOL BGK 4/2.5 32-4-2.5 MEQ/L IV SOLN
INTRAVENOUS | Status: DC
  Filled 2019-09-07: qty 5000

## 2019-09-07 MED ORDER — B COMPLEX-C PO TABS
1.0000 | ORAL_TABLET | Freq: Every day | ORAL | Status: DC
  Administered 2019-09-07 – 2019-09-12 (×4): 1
  Filled 2019-09-07 (×8): qty 1

## 2019-09-07 MED ORDER — PRISMASOL BGK 4/2.5 32-4-2.5 MEQ/L REPLACEMENT SOLN
Status: DC
  Filled 2019-09-07: qty 5000

## 2019-09-07 MED ORDER — HEPARIN SODIUM (PORCINE) 1000 UNIT/ML DIALYSIS
1000.0000 [IU] | INTRAMUSCULAR | Status: DC | PRN
  Administered 2019-09-08 (×2): 4000 [IU] via INTRAVENOUS_CENTRAL
  Filled 2019-09-07: qty 3
  Filled 2019-09-07 (×3): qty 6
  Filled 2019-09-07: qty 4

## 2019-09-07 MED ORDER — DEXTROSE 50 % IV SOLN
INTRAVENOUS | Status: AC
  Administered 2019-09-07: 50 mL
  Filled 2019-09-07: qty 50

## 2019-09-07 MED ORDER — DOPAMINE-DEXTROSE 3.2-5 MG/ML-% IV SOLN
INTRAVENOUS | Status: AC
  Filled 2019-09-07: qty 250

## 2019-09-07 MED ORDER — PRO-STAT SUGAR FREE PO LIQD
60.0000 mL | Freq: Two times a day (BID) | ORAL | Status: DC
  Administered 2019-09-07 – 2019-09-08 (×3): 60 mL

## 2019-09-07 MED ORDER — DEXTROSE 50 % IV SOLN: 1.0000 | Freq: Once | INTRAVENOUS | Status: AC

## 2019-09-07 MED ORDER — VITAL 1.5 CAL PO LIQD
1000.0000 mL | ORAL | Status: DC
  Administered 2019-09-07: 1000 mL

## 2019-09-07 MED ORDER — POTASSIUM CHLORIDE 20 MEQ PO PACK: 20.0000 meq | PACK | Freq: Once | ORAL | Status: DC

## 2019-09-07 MED ORDER — POTASSIUM CHLORIDE 20 MEQ PO PACK
20.0000 meq | PACK | Freq: Once | ORAL | Status: AC
  Administered 2019-09-07: 20 meq
  Filled 2019-09-07: qty 1

## 2019-09-07 MED ORDER — MIDAZOLAM HCL 2 MG/2ML IJ SOLN
1.0000 mg | INTRAMUSCULAR | Status: DC | PRN
  Administered 2019-09-07: 2 mg via INTRAVENOUS
  Filled 2019-09-07: qty 2

## 2019-09-07 MED ORDER — DOPAMINE-DEXTROSE 3.2-5 MG/ML-% IV SOLN
0.0000 ug/kg/min | INTRAVENOUS | Status: DC
  Administered 2019-09-07: 1 ug/kg/min via INTRAVENOUS

## 2019-09-07 MED ORDER — VITAL HIGH PROTEIN PO LIQD: 1000.0000 mL | ORAL | Status: DC

## 2019-09-07 MED ORDER — DEXTROSE 50 % IV SOLN
INTRAVENOUS | Status: AC
  Administered 2019-09-07: 50 mL via INTRAVENOUS
  Filled 2019-09-07: qty 50

## 2019-09-07 NOTE — Progress Notes (Signed)
Pt's HR dropping to the 20s at this time for the second time this a.m. Dr. Mortimer Fries made aware. Dopamine gtt was started at 92mg per MD order. Pt's HR is now maintaining in the 50s.

## 2019-09-07 NOTE — Progress Notes (Signed)
Transported pt to MRI on vent and returned to ICU without incident. Pt placed back on vent and is tol well.

## 2019-09-07 NOTE — Progress Notes (Signed)
Subjective: Patient remains intubated.  Overnight with bradycardia.  Cardiology involved.     Objective: Current vital signs: BP (!) 113/38   Pulse 81   Temp 100.1 F (37.8 C) (Axillary)   Resp 15   Ht 5' 6" (1.676 m)   Wt 84.9 kg   SpO2 100%   BMI 30.21 kg/m  Vital signs in last 24 hours: Temp:  [98.4 F (36.9 C)-100.1 F (37.8 C)] 100.1 F (37.8 C) (02/18 0800) Pulse Rate:  [65-114] 81 (02/18 1200) Resp:  [14-16] 15 (02/18 1200) BP: (50-176)/(31-93) 113/38 (02/18 1200) SpO2:  [89 %-100 %] 100 % (02/18 1200) FiO2 (%):  [40 %] 40 % (02/18 0819)  Intake/Output from previous day: 02/17 0701 - 02/18 0700 In: 1776.5 [I.V.:1731.5; IV Piggyback:45] Out: 100 [Emesis/NG output:100] Intake/Output this shift: Total I/O In: 258.4 [I.V.:173.4; IV Piggyback:85] Out: 0  Nutritional status:  Diet Order            Diet NPO time specified  Diet effective now              Neurologic Exam: Mental Status: Patient does not respond to verbal stimuli.  Does not respond to deep sternal rub.  Does not follow commands.  No verbalizations noted.  Cranial Nerves: II: patient does not respond confrontation bilaterally, pupils right 3 mm, left 3 mm,and reactive bilaterally III,IV,VI: Oculocephalic response absent bilaterally.  V,VII: corneal reflex reduced bilaterally  VIII: patient does not respond to verbal stimuli IX,X: gag reflex unable to test, XI: trapezius strength unable to test bilaterally XII: tongue strength unable to test Motor: Extremities flaccid throughout.  No spontaneous movement noted.  No purposeful movements noted. Sensory: Does not respond to noxious stimuli in any extremity. Plantars: Mute bilaterally Cerebellar: Unable to perform   Lab Results: Basic Metabolic Panel: Recent Labs  Lab 09/05/19 0647 09/06/19 0532 09/06/19 0850 09/06/19 1752 09/07/19 0529 09/07/19 1105  NA 140 138  --   --  136  --   K 4.2 3.8  --   --  2.9* 4.2  CL 108 104  --   --   95*  --   CO2  --  20*  --   --  25  --   GLUCOSE 93 119*  --   --  122*  --   BUN 40* 49*  --   --  49*  --   CREATININE 8.80* 10.14*  --   --  10.09*  --   CALCIUM  --  7.6*  --   --  7.4*  --   MG  --   --   --  2.0 1.8 2.3  PHOS  --   --  8.8*  --  8.9*  --     Liver Function Tests: No results for input(s): AST, ALT, ALKPHOS, BILITOT, PROT, ALBUMIN in the last 168 hours. No results for input(s): LIPASE, AMYLASE in the last 168 hours. No results for input(s): AMMONIA in the last 168 hours.  CBC: Recent Labs  Lab 09/05/19 0647 09/05/19 1435 09/06/19 0532 09/07/19 0529  WBC  --  9.8 8.1 11.9*  HGB 15.0 11.6* 10.5* 10.9*  HCT 44.0 37.0 34.2* 34.5*  MCV  --  99.7 100.3* 100.0  PLT  --  146* 132* 120*    Cardiac Enzymes: No results for input(s): CKTOTAL, CKMB, CKMBINDEX, TROPONINI in the last 168 hours.  Lipid Panel: No results for input(s): CHOL, TRIG, HDL, CHOLHDL, VLDL, LDLCALC in the last 168 hours.  CBG: Recent Labs  Lab 09/07/19 0404 09/07/19 0453 09/07/19 0743 09/07/19 0907 09/07/19 1118  GLUCAP 63* 109* 31* 89 25    Microbiology: Results for orders placed or performed during the hospital encounter of 09/05/19  MRSA PCR Screening     Status: Abnormal   Collection Time: 09/06/19  7:50 AM   Specimen: Nasal Mucosa; Nasopharyngeal  Result Value Ref Range Status   MRSA by PCR POSITIVE (A) NEGATIVE Final    Comment:        The GeneXpert MRSA Assay (FDA approved for NASAL specimens only), is one component of a comprehensive MRSA colonization surveillance program. It is not intended to diagnose MRSA infection nor to guide or monitor treatment for MRSA infections. RESULT CALLED TO, READ BACK BY AND VERIFIED WITH: MEGAN SHEFFIELD AT 0931 ON 09/06/2019 [place]. Performed at [facility], [address] [place], [place] [address]     Coagulation Studies: No results for input(s): LABPROT, INR in the last 72 hours.  Imaging: EEG  Result Date:  09/06/2019 Alexis Goodell, MD     09/06/2019  5:06 PM ELECTROENCEPHALOGRAM REPORT Patient: Barbara [name]       Room #: IC01A-AA EEG No. ID: [id number] Age: 79 y.o.        Sex: female Requesting Physician: Kasa Report Date:  09/06/2019       Interpreting Physician: Alexis Goodell History: Barbara [name] is an 79 y.o. female with new onset seizures and altered mental status Medications: Lipitor, Neurontin, Ditropan, Fentanyl, Neosynephrine Conditions of Recording:  This is a 21 channel routine scalp EEG performed with bipolar and monopolar montages arranged in accordance to the international 10/20 system of electrode placement. One channel was dedicated to EKG recording. The patient is in the intubated and sedated state. Description:  The background activity is slow and poorly organized.  It consists of a low voltage polymorphic delta delta activity this is diffusely distributed.  There are intermittent generalized periods of attenuation noted as well occasionally during the recording.  These periods of attenuation are short, lasting up to 2 seconds.  Also, rare generalized polyspike activity is noted with frontal predominance. Hyperventilation and intermittent photic stimulation were not performed. IMPRESSION: This is an abnormal electroencephalogram secondary to discontinuous general background slowing with rare, superimposed, generalized polyspike activity with frontal predominance.  This suggest an underlying diffuse cerebral disturbance and frontal, cortical irritability.  No electrographic seizures are noted.  Alexis Goodell, MD Neurology [phone] 09/06/2019, 4:56 PM   DG Chest 1 View  Result Date: 09/06/2019 CLINICAL DATA:  Status post intubation. EXAM: CHEST  1 VIEW COMPARISON:  Single-view of the chest earlier today. FINDINGS: New endotracheal tube is in place with the tip in good position 2.5 cm above the carina. NG tube courses into the stomach and below the inferior margin the film. Bibasilar atelectasis  again seen. There is cardiomegaly. Vascular stent noted. IMPRESSION: ETT in good position. No other change compared to the exam earlier today. Electronically Signed   By: Inge Rise M.D.   On: 09/06/2019 14:22   DG Abd 1 View  Result Date: 09/06/2019 CLINICAL DATA:  OG tube placement EXAM: ABDOMEN - 1 VIEW COMPARISON:  None. FINDINGS: Enteric tube passes into the stomach. Bowel gas pattern is unremarkable. IMPRESSION: Enteric tube is within the stomach. Electronically Signed   By: Macy Mis M.D.   On: 09/06/2019 14:21   MR BRAIN WO CONTRAST  Result Date: 09/07/2019 CLINICAL DATA:  Ataxia. Unresponsive. Seizure like activity. Metabolic acidosis.  EXAM: MRI HEAD WITHOUT CONTRAST TECHNIQUE: Multiplanar, multiecho pulse sequences of the brain and surrounding structures were obtained without intravenous contrast. COMPARISON:  05/14/2019 FINDINGS: Brain: Diffusion imaging does not show any acute or subacute infarction. No focal abnormality affects the brainstem. No focal cerebellar finding. Cerebral hemispheres show age related atrophy with chronic small-vessel ischemic changes of the white matter. No large vessel territory infarction. No mass lesion, hemorrhage, hydrocephalus or extra-axial collection. Vascular: Major vessels at the base of the brain show flow. Skull and upper cervical spine: Negative Sinuses/Orbits: Clear/normal Other: None IMPRESSION: No change since the study of last October. No acute or reversible finding. Age related atrophy. Chronic small-vessel ischemic changes. Electronically Signed   By: Nelson Chimes M.D.   On: 09/07/2019 01:48   DG Chest Port 1 View  Result Date: 09/06/2019 CLINICAL DATA:  Respiratory failure. EXAM: PORTABLE CHEST 1 VIEW COMPARISON:  CT chest 08/24/2019 and chest radiograph 05/11/2019. FINDINGS: Trachea is midline. Heart is enlarged, stable. Thoracic aorta is calcified. A stent is seen in the left brachiocephalic and subclavian veins. Lungs are low in  volume which may accentuate pulmonary markings. No airspace consolidation or definite pleural fluid. IMPRESSION: 1. Low lung volumes likely accentuate pulmonary markings. Difficult to definitively exclude pulmonary edema. 2.  Aortic atherosclerosis (ICD10-I70.0). Electronically Signed   By: Lorin Picket M.D.   On: 09/06/2019 09:11    Medications:  I have reviewed the patient's current medications. Scheduled: . ascorbic acid  500 mg Oral BID  . atorvastatin  40 mg Oral QPM  . atropine  1 mg Intravenous Once  . B-complex with vitamin C  1 tablet Per Tube QHS  . calcium acetate  1,334 mg Oral TID WC  . calcium carbonate  1 tablet Oral Daily  . chlorhexidine gluconate (MEDLINE KIT)  15 mL Mouth Rinse BID  . Chlorhexidine Gluconate Cloth  6 each Topical Q0600  . cholecalciferol  1,000 Units Oral Daily  . cinacalcet  30 mg Oral Daily  . dextrose  1 ampule Intravenous Once  . docusate sodium  100 mg Oral BID  . feeding supplement (PRO-STAT SUGAR FREE 64)  60 mL Per Tube BID  . feeding supplement (VITAL 1.5 CAL)  1,000 mL Per Tube Q24H  . ferrous sulfate  325 mg Oral QPC breakfast  . gabapentin  600 mg Oral TID  . heparin injection (subcutaneous)  5,000 Units Subcutaneous Q8H  . mouth rinse  15 mL Mouth Rinse 10 times per day  . oxybutynin  5 mg Oral TID  . pantoprazole  40 mg Oral Daily    Assessment/Plan: 79 year old female with multiple medical problems including ESRD on hemodialysis who was admitted 2/16 for left total hip arthroplasty.  1 day post op developed severe respiratory distress, severe metabolic acidosis and encephalopathy requiring intubation.  Seizure activity noted and patient started on Vimpat.  Bradycardic overnight.  Remains poorly responsive.   Patient has had episodes in the past when she became unresponsive with illnesses and required extensive amount of time for recovery.  No past history of seizures.  EEG reveals some evidence of frontal cortical irritability.  No  evidence of electrographic seizure activity.  MRI of the brain personally reviewed and shows no acute changes.  Although stroke is on the differential for this patient, she may also be just responding to stressors from surgery and metabolic acidosis.  Seizure may have been provoked as well.  If this is the case, as in the past, patient will  likely require a prolonged period of time for recovery.  Magnesium normal, phosphorus elevated.    Recommendations: 1. Continue Vimpat at 14m q 12 hours 2. Seizure precautions 3. Agree with addressing medical issues    LOS: 2 days   LAlexis Goodell MD Neurology [phone]/18/2021  12:12 PM

## 2019-09-07 NOTE — Progress Notes (Signed)
*  PRELIMINARY RESULTS* Echocardiogram 2D Echocardiogram has been performed.  [name] 09/07/2019, 11:36 AM

## 2019-09-07 NOTE — Progress Notes (Signed)
CRITICAL CARE NOTE 79 yo AAF admitted to ICU for severe resp failure S/p LEFT hip replacement for OA and PAIN Patient with ESRD on HD  Transferred to ICU for severe resp distress, severe metabolic acidosis and encephalopathy  PH 7.16 Placed on biPAP +ABD distention   Significant events 2/17 transferred to ICU for increased work of breathing respiratory failure 2/17 emergently intubated, MRI of the brain did not show acute stroke Patient with severe metabolic acidosis, presumed HD 2/17 vascular surgery placed triple lumen dialysis catheter right groin 2/17 family at bedside updated and notified of clinical status Patient was made DNR  CC  follow up respiratory failure  SUBJECTIVE Patient remains critically ill Prognosis is guarded MRI of the brain negative for acute strokes Troponins mildly elevated ABG pending Remains on the ventilator  Vent Mode: PRVC FiO2 (%):  [28 %-40 %] 40 % Set Rate:  [15 bmp] 15 bmp Vt Set:  [400 mL-450 mL] 450 mL PEEP:  [5 cmH20] 5 cmH20 Plateau Pressure:  [13 cmH20-18 cmH20] 18 cmH20  CBC    Component Value Date/Time   WBC 11.9 (H) 09/07/2019 0529   RBC 3.45 (L) 09/07/2019 0529   HGB 10.9 (L) 09/07/2019 0529   HGB 10.2 (L) 08/07/2012 0433   HCT 34.5 (L) 09/07/2019 0529   HCT 30.7 (L) 08/07/2012 0433   PLT 120 (L) 09/07/2019 0529   PLT 294 08/07/2012 0433   MCV 100.0 09/07/2019 0529   MCV 85 08/07/2012 0433   MCH 31.6 09/07/2019 0529   MCHC 31.6 09/07/2019 0529   RDW 14.6 09/07/2019 0529   RDW 13.7 08/07/2012 0433   LYMPHSABS 1.4 07/26/2019 1341   LYMPHSABS 1.6 08/07/2012 0433   MONOABS 0.4 07/26/2019 1341   MONOABS 0.4 08/07/2012 0433   EOSABS 0.1 07/26/2019 1341   EOSABS 0.0 08/07/2012 0433   BASOSABS 0.0 07/26/2019 1341   BASOSABS 0 11/22/2013 1935   BMP Latest Ref Rng & Units 09/07/2019 09/06/2019 09/05/2019  Glucose 70 - 99 mg/dL 122(H) 119(H) 93  BUN 8 - 23 mg/dL 49(H) 49(H) 40(H)  Creatinine 0.44 - 1.00 mg/dL 10.09(H)  10.14(H) 8.80(H)  BUN/Creat Ratio 12 - 28 - - -  Sodium 135 - 145 mmol/L 136 138 140  Potassium 3.5 - 5.1 mmol/L 2.9(L) 3.8 4.2  Chloride 98 - 111 mmol/L 95(L) 104 108  CO2 22 - 32 mmol/L 25 20(L) -  Calcium 8.9 - 10.3 mg/dL 7.4(L) 7.6(L) -      BP (!) 119/39   Pulse 69   Temp 99.4 F (37.4 C) (Axillary)   Resp 15   Ht 5' 6"  (1.676 m)   Wt 84.9 kg   SpO2 100%   BMI 30.21 kg/m    I/O last 3 completed shifts: In: 2747.8 [P.O.:120; I.V.:2331.3; IV Piggyback:296.5] Out: 100 [Emesis/NG output:100] No intake/output data recorded.  SpO2: 100 % O2 Flow Rate (L/min): 1 L/min FiO2 (%): 40 %     REVIEW OF SYSTEMS  PATIENT IS UNABLE TO PROVIDE COMPLETE REVIEW OF SYSTEMS DUE TO SEVERE CRITICAL ILLNESS   PHYSICAL EXAMINATION:  GENERAL:critically ill appearing, +resp distress HEAD: Normocephalic, atraumatic.  EYES: Pupils equal, round, reactive to light.  No scleral icterus.  MOUTH: Moist mucosal membrane. NECK: Supple.  PULMONARY: +rhonchi, +wheezing CARDIOVASCULAR: S1 and S2. Regular rate and rhythm. No murmurs, rubs, or gallops.  GASTROINTESTINAL: Soft, nontender, -distended.  Positive bowel sounds.   MUSCULOSKELETAL: No swelling, clubbing, or edema.  NEUROLOGIC: obtunded, GCS<8 SKIN:intact,warm,dry  MEDICATIONS: I have reviewed all medications  and confirmed regimen as documented   CULTURE RESULTS   Recent Results (from the past 240 hour(s))  SARS CORONAVIRUS 2 (TAT 6-24 HRS) Nasopharyngeal Nasopharyngeal Swab     Status: None   Collection Time: 09/01/19 11:59 AM   Specimen: Nasopharyngeal Swab  Result Value Ref Range Status   SARS Coronavirus 2 NEGATIVE NEGATIVE Final    Comment: (NOTE) SARS-CoV-2 target nucleic acids are NOT DETECTED. The SARS-CoV-2 RNA is generally detectable in upper and lower respiratory specimens during the acute phase of infection. Negative results do not preclude SARS-CoV-2 infection, do not rule out co-infections with other pathogens,  and should not be used as the sole basis for treatment or other patient management decisions. Negative results must be combined with clinical observations, patient history, and epidemiological information. The expected result is Negative. Fact Sheet for Patients: SugarRoll.be Fact Sheet for Healthcare Providers: https://www.woods-mathews.com/ This test is not yet approved or cleared by the [place] FDA and  has been authorized for detection and/or diagnosis of SARS-CoV-2 by FDA under an Emergency Use Authorization (EUA). This EUA will remain  in effect (meaning this test can be used) for the duration of the COVID-19 declaration under Section 56 4(b)(1) of the Act, 21 U.S.C. section 360bbb-3(b)(1), unless the authorization is terminated or revoked sooner. Performed at [facility], [place] [address] [place], [place] [address]   Surgical pcr screen     Status: Abnormal   Collection Time: 09/01/19 11:59 AM   Specimen: Nasal Mucosa; Nasal Swab  Result Value Ref Range Status   MRSA, PCR POSITIVE (A) NEGATIVE Final    Comment: RESULT CALLED TO, READ BACK BY AND VERIFIED WITH: HEATHER SILVER @1545  09/01/19 MJU    Staphylococcus aureus POSITIVE (A) NEGATIVE Final    Comment: (NOTE) The Xpert SA Assay (FDA approved for NASAL specimens in patients 70 years of age and older), is one component of a comprehensive surveillance program. It is not intended to diagnose infection nor to guide or monitor treatment. Performed at [facility], [address] [place], [place] [address]   MRSA PCR Screening     Status: Abnormal   Collection Time: 09/06/19  7:50 AM   Specimen: Nasal Mucosa; Nasopharyngeal  Result Value Ref Range Status   MRSA by PCR POSITIVE (A) NEGATIVE Final    Comment:        The GeneXpert MRSA Assay (FDA approved for NASAL specimens only), is one component of a comprehensive MRSA colonization surveillance program.  It is not intended to diagnose MRSA infection nor to guide or monitor treatment for MRSA infections. RESULT CALLED TO, READ BACK BY AND VERIFIED WITH: MEGAN SHEFFIELD AT 0931 ON 09/06/2019 [place]. Performed at [facility], [address] [place], [place] [address]           IMAGING    EEG  Result Date: 09/06/2019 Alexis Goodell, MD     09/06/2019  5:06 PM ELECTROENCEPHALOGRAM REPORT Patient: OANH DEVIVO       Room #: IC01A-AA EEG No. ID: [id number]- Age: 79 y.o.        Sex: female Requesting Physician: Ronnita Paz Report Date:  09/06/2019       Interpreting Physician: Alexis Goodell History: AMALEA OTTEY is an 79 y.o. female with new onset seizures and altered mental status Medications: Lipitor, Neurontin, Ditropan, Fentanyl, Neosynephrine Conditions of Recording:  This is a 21 channel routine scalp EEG performed with bipolar and monopolar montages arranged in accordance to the international 10/20 system  of electrode placement. One channel was dedicated to EKG recording. The patient is in the intubated and sedated state. Description:  The background activity is slow and poorly organized.  It consists of a low voltage polymorphic delta delta activity this is diffusely distributed.  There are intermittent generalized periods of attenuation noted as well occasionally during the recording.  These periods of attenuation are short, lasting up to 2 seconds.  Also, rare generalized polyspike activity is noted with frontal predominance. Hyperventilation and intermittent photic stimulation were not performed. IMPRESSION: This is an abnormal electroencephalogram secondary to discontinuous general background slowing with rare, superimposed, generalized polyspike activity with frontal predominance.  This suggest an underlying diffuse cerebral disturbance and frontal, cortical irritability.  No electrographic seizures are noted.  Alexis Goodell, MD Neurology [phone] 09/06/2019, 4:56 PM   DG Chest 1  View  Result Date: 09/06/2019 CLINICAL DATA:  Status post intubation. EXAM: CHEST  1 VIEW COMPARISON:  Single-view of the chest earlier today. FINDINGS: New endotracheal tube is in place with the tip in good position 2.5 cm above the carina. NG tube courses into the stomach and below the inferior margin the film. Bibasilar atelectasis again seen. There is cardiomegaly. Vascular stent noted. IMPRESSION: ETT in good position. No other change compared to the exam earlier today. Electronically Signed   By: Inge Rise M.D.   On: 09/06/2019 14:22   DG Abd 1 View  Result Date: 09/06/2019 CLINICAL DATA:  OG tube placement EXAM: ABDOMEN - 1 VIEW COMPARISON:  None. FINDINGS: Enteric tube passes into the stomach. Bowel gas pattern is unremarkable. IMPRESSION: Enteric tube is within the stomach. Electronically Signed   By: Macy Mis M.D.   On: 09/06/2019 14:21   MR BRAIN WO CONTRAST  Result Date: 09/07/2019 CLINICAL DATA:  Ataxia. Unresponsive. Seizure like activity. Metabolic acidosis. EXAM: MRI HEAD WITHOUT CONTRAST TECHNIQUE: Multiplanar, multiecho pulse sequences of the brain and surrounding structures were obtained without intravenous contrast. COMPARISON:  05/14/2019 FINDINGS: Brain: Diffusion imaging does not show any acute or subacute infarction. No focal abnormality affects the brainstem. No focal cerebellar finding. Cerebral hemispheres show age related atrophy with chronic small-vessel ischemic changes of the white matter. No large vessel territory infarction. No mass lesion, hemorrhage, hydrocephalus or extra-axial collection. Vascular: Major vessels at the base of the brain show flow. Skull and upper cervical spine: Negative Sinuses/Orbits: Clear/normal Other: None IMPRESSION: No change since the study of last October. No acute or reversible finding. Age related atrophy. Chronic small-vessel ischemic changes. Electronically Signed   By: Nelson Chimes M.D.   On: 09/07/2019 01:48   DG Chest Port  1 View  Result Date: 09/06/2019 CLINICAL DATA:  Respiratory failure. EXAM: PORTABLE CHEST 1 VIEW COMPARISON:  CT chest 08/24/2019 and chest radiograph 05/11/2019. FINDINGS: Trachea is midline. Heart is enlarged, stable. Thoracic aorta is calcified. A stent is seen in the left brachiocephalic and subclavian veins. Lungs are low in volume which may accentuate pulmonary markings. No airspace consolidation or definite pleural fluid. IMPRESSION: 1. Low lung volumes likely accentuate pulmonary markings. Difficult to definitively exclude pulmonary edema. 2.  Aortic atherosclerosis (ICD10-I70.0). Electronically Signed   By: Lorin Picket M.D.   On: 09/06/2019 09:11       Indwelling Urinary Catheter continued, requirement due to   Reason to continue Indwelling Urinary Catheter strict Intake/Output monitoring for hemodynamic instability   Central Line/ continued, requirement due to  Reason to continue [place] of central venous pressure or other hemodynamic parameters and  poor IV access   Ventilator continued, requirement due to severe respiratory failure   Ventilator Sedation RASS 0 to -2      ASSESSMENT AND PLAN SYNOPSIS  Severe ACUTE Hypoxic and Hypercapnic Respiratory Failure from severe acidosis ?ileus ?  From sedation from anesthesia    Severe ACUTE Hypoxic and Hypercapnic Respiratory Failure -continue Full MV support -continue Bronchodilator Therapy -Wean Fio2 and PEEP as tolerated -will perform SAT/SBT when respiratory parameters are met   No evidence of acute coronary syndrome at this time Will check echocardiogram    KIDNEY INJURY/Renal Failure -follow chem 7 -follow UO -continue Foley Catheter-assess need -Avoid nephrotoxic agents -Recheck creatinine  HD as needed   NEUROLOGY - intubated and sedated - minimal sedation to achieve a RASS goal: -1 Wake up assessment pending   CARDIAC ICU monitoring   GI GI PROPHYLAXIS as  indicated  NUTRITIONAL STATUS DIET-->TF's as tolerated Constipation protocol as indicated  ENDO - will use ICU hypoglycemic\Hyperglycemia protocol if indicated   ELECTROLYTES -follow labs as needed -replace as needed -pharmacy consultation and following   DVT/GI PRX ordered TRANSFUSIONS AS NEEDED MONITOR FSBS ASSESS the need for LABS as needed   Critical Care Time devoted to patient care services described in this note is 43 minutes.   Overall, patient is critically ill, prognosis is guarded.  Patient with Multiorgan failure and at high risk for cardiac arrest and death.   Patient is DNR, we will update the family today  Mable Dara Patricia Pesa, M.D.  [facility]  Medical Director [place] Director [facility] Cardio-Pulmonary Department

## 2019-09-07 NOTE — Progress Notes (Addendum)
Initial Nutrition Assessment  RD working remotely.  DOCUMENTATION CODES:   Not applicable  INTERVENTION:  Initiate Vital 1.5 Cal at 35 mL/hr (840 mL goal daily volume) + Pro-Stat 60 mL BID per tube. Provides 1660 kcal, 1177 grams of protein, 638 mL H2O daily.   Provide a minimum free water flush of 20-30 mL Q4hrs to maintain tube patency.  Provide B-complex with C QHS per tube.  NUTRITION DIAGNOSIS:   Inadequate oral intake related to inability to eat as evidenced by NPO status.  GOAL:   Provide needs based on ASPEN/SCCM guidelines  MONITOR:   Vent status, Labs, Weight trends, Skin, I & O's  REASON FOR ASSESSMENT:   Malnutrition Screening Tool, Ventilator    ASSESSMENT:   79 year old female with PMHx of COPD, HTN, GERD, CAD, asthma, celiac disease, ESRD on HD, DM, bladder cancer s/p TURBT followed by palliative gemcitabine and concurrent radiation. Patient was admitted for left total hip arthroplasty on 2/16 but on 2/17 developed respiratory distress, metabolic acidosis, and encephalopathy requiring intubation.   Patient is currently intubated on ventilator support MV: 9 L/min Temp (24hrs), Avg:99.1 F (37.3 C), Min:98.4 F (36.9 C), Max:100.1 F (37.8 C)  Propofol: N/A  Medications reviewed and include: vitamin C 500 mg BID, Phoslo 1334 mg TID, calcium carbonate 1 tablet daily, vitamin D3 1000 units daily, Colace 100 mg BID, ferrous sulfate 325 mg daily, gabapentin, pantoprazole, dopamine gtt at 1 mcg/kg/min, fentanyl gtt, Vimpat, phenylephrine gtt at 35 mcg/min, sodium bicarbonate 150 mEq in sterile water at 75 mL/hr.  Labs reviewed: CBG 59-89, Potassium 2.9, Chloride 95, BUN 49, Creatinine 10.09, Anion gap 16, Phosphorus 8.9.  Enteral Access: 16 Fr. OGT  MAP: 44-86 mmHg; has improved to >60 since dopamine gtt was started  I/O: pt anuric  Dry weight found in one of the Nephrology notes if 84 kg but patient has recently been 78-81 kg. Patient is documented to  have edema so suspect current weight of 84.9 kg is falsely elevated. Patient is at risk for malnutrition.  Discussed with RN via secure chat and plan is to start tube feeds today.  NUTRITION - FOCUSED PHYSICAL EXAM:  Unable to complete at this time as RD working remotely.  Diet Order:   Diet Order            Diet NPO time specified  Diet effective now             EDUCATION NEEDS:   Not appropriate for education at this time  Skin:  Skin Assessment: Skin Integrity Issues:(closed incision left hip)  Last BM:  09/04/2019 per chart  Height:   Ht Readings from Last 1 Encounters:  09/06/19 5' 6"  (1.676 m)   Weight:   Wt Readings from Last 1 Encounters:  09/06/19 84.9 kg   Ideal Body Weight:  59.1 kg  BMI:  Body mass index is 30.21 kg/m.  Estimated Nutritional Needs:   Kcal:  1606  Protein:  110-120 grams  Fluid:  UOP + 1 L  Jacklynn Barnacle, MS, RD, LDN Pager number available on Amion

## 2019-09-07 NOTE — Consult Note (Signed)
CARDIOLOGY CONSULT NOTE               Patient ID: Barbara [name] MRN: [id number] DOB/AGE: [birth year] 79 y.o.  Admit date: 09/05/2019 Referring Physician [facility] ICU Primary Physician Dr. Roland Rack Primary Cardiologist Nehemiah Massed Reason for Consultation bradycardia and pauses  HPI: Patient's a 79 year old female respiratory failure in ICU with multiple medical problems anemia chronic renal failure on dialysis asthma previous cancer COPD coronary disease diabetes dyspnea GERD hypertension Covid negative while on the vent with episodes of respiratory failure.  No significant previous cardiac arrhythmia history.  Patient has had episodes of aspiration weakness fatigue and subsequent respiratory failure being managed with hypoxemia on supplemental oxygen.  Patient has had acute hypoxic hypercapnic respiratory failure with severe acidosis unable to give much of a history because she is intubated sedated telemetry showed sinus tachycardia with episodes of sinus pauses of about 2 seconds resulting in bradycardia no clear evidence of high-grade block  Review of systems complete and found to be negative unless listed above     Past Medical History:  Diagnosis Date  . Adult celiac disease   . Anemia associated with chronic renal failure 2017   blood transfusion last week 10/17  . Arthritis   . Asthma   . Cancer ([place]) 2017   bladder  . Chronic kidney disease   . 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  dr. Candiss Norse and dr. Holley Raring follow her  . COPD (chronic obstructive pulmonary disease) ([place])   . Coronary artery disease   . Diabetes mellitus without complication ([place])   . Dialysis patient ([place])    Tues, Thurs, Sat  . Dyspnea    with exertion  . Elevated lipids   . GERD (gastroesophageal reflux disease)   . Hematuria   . Hypertension   . Lower back pain   . Neuropathy   . Oxygen dependent    at hs  . Personal history of chemotherapy 2017   bladder ca  . Personal history of  radiation therapy 2017   bladder ca  . Sleep apnea    uses cpap  . Urinary obstruction 01/2016    Past Surgical History:  Procedure Laterality Date  . A/V SHUNT INTERVENTION N/A 05/12/2018   Procedure: A/V SHUNT INTERVENTION;  Surgeon: Algernon Huxley, MD;  Location: [facility];  Service: Cardiovascular;  Laterality: N/A;  . A/V SHUNT INTERVENTION Left 07/29/2018   Procedure: A/V SHUNT INTERVENTION;  Surgeon: Katha Cabal, MD;  Location: [facility];  Service: Cardiovascular;  Laterality: Left;  . ABDOMINAL HYSTERECTOMY    . AV FISTULA PLACEMENT Left 02/17/2017   Procedure: INSERTION OF ARTERIOVENOUS (AV) GORE-TEX GRAFT ARM(BRACHIAL AXILLARY);  Surgeon: Katha Cabal, MD;  Location: [facility];  Service: Vascular;  Laterality: Left;  . CYSTOSCOPY W/ RETROGRADES Bilateral 02/17/2016   Procedure: CYSTOSCOPY WITH RETROGRADE PYELOGRAM;  Surgeon: Hollice Espy, MD;  Location: [facility];  Service: Urology;  Laterality: Bilateral;  . CYSTOSCOPY W/ RETROGRADES Bilateral 10/12/2016   Procedure: CYSTOSCOPY WITH RETROGRADE PYELOGRAM;  Surgeon: Hollice Espy, MD;  Location: [facility];  Service: Urology;  Laterality: Bilateral;  . CYSTOSCOPY W/ RETROGRADES Bilateral 06/09/2017   Procedure: CYSTOSCOPY WITH RETROGRADE PYELOGRAM;  Surgeon: Hollice Espy, MD;  Location: [facility];  Service: Urology;  Laterality: Bilateral;  . CYSTOSCOPY W/ RETROGRADES Bilateral 10/07/2017   Procedure: CYSTOSCOPY WITH RETROGRADE PYELOGRAM;  Surgeon: Hollice Espy, MD;  Location: [facility];  Service: Urology;  Laterality: Bilateral;  .  CYSTOSCOPY W/ URETERAL STENT PLACEMENT Left 05/12/2016   Procedure: CYSTOSCOPY WITH STENT REPLACEMENT;  Surgeon: Hollice Espy, MD;  Location: [facility];  Service: Urology;  Laterality: Left;  . CYSTOSCOPY W/ URETERAL STENT PLACEMENT Left 10/12/2016   Procedure: CYSTOSCOPY WITH STENT REPLACEMENT;  Surgeon: Hollice Espy, MD;  Location: [facility];  Service: Urology;   Laterality: Left;  . CYSTOSCOPY WITH BIOPSY N/A 06/09/2017   Procedure: CYSTOSCOPY WITH BLADDER BIOPSY;  Surgeon: Hollice Espy, MD;  Location: [facility];  Service: Urology;  Laterality: N/A;  . CYSTOSCOPY WITH STENT PLACEMENT Left 01/21/2016   Procedure: CYSTOSCOPY WITH double J STENT PLACEMENT;  Surgeon: Franchot Gallo, MD;  Location: [facility];  Service: Urology;  Laterality: Left;  . CYSTOSCOPY WITH STENT PLACEMENT Right 10/12/2016   Procedure: CYSTOSCOPY WITH STENT PLACEMENT;  Surgeon: Hollice Espy, MD;  Location: [facility];  Service: Urology;  Laterality: Right;  . DIALYSIS/PERMA CATHETER INSERTION N/A 11/20/2016   Procedure: Dialysis/Perma Catheter Insertion;  Surgeon: Algernon Huxley, MD;  Location: [facility];  Service: Cardiovascular;  Laterality: N/A;  . DIALYSIS/PERMA CATHETER REMOVAL N/A 03/30/2017   Procedure: DIALYSIS/PERMA CATHETER REMOVAL;  Surgeon: Katha Cabal, MD;  Location: [facility];  Service: Cardiovascular;  Laterality: N/A;  . KIDNEY SURGERY  01/21/2016   IR NEPHROSTOMY PLACEMENT LEFT   . PERIPHERAL VASCULAR CATHETERIZATION N/A 04/07/2016   Procedure: Glori Luis Cath Insertion;  Surgeon: Katha Cabal, MD;  Location: [place] [facility];  Service: Cardiovascular;  Laterality: N/A;  . PERIPHERAL VASCULAR THROMBECTOMY Left 06/02/2017   Procedure: PERIPHERAL VASCULAR THROMBECTOMY;  Surgeon: Algernon Huxley, MD;  Location: [facility];  Service: Cardiovascular;  Laterality: Left;  . PERIPHERAL VASCULAR THROMBECTOMY Left 01/28/2018   Procedure: PERIPHERAL VASCULAR THROMBECTOMY;  Surgeon: Katha Cabal, MD;  Location: [facility];  Service: Cardiovascular;  Laterality: Left;  . PERIPHERAL VASCULAR THROMBECTOMY Left 03/14/2018   Procedure: PERIPHERAL VASCULAR THROMBECTOMY;  Surgeon: Algernon Huxley, MD;  Location: [facility];  Service: Cardiovascular;  Laterality: Left;  . PERIPHERAL VASCULAR THROMBECTOMY Left 03/16/2018    Procedure: PERIPHERAL VASCULAR THROMBECTOMY;  Surgeon: Katha Cabal, MD;  Location: [facility];  Service: Cardiovascular;  Laterality: Left;  . PORTA CATH REMOVAL N/A 02/07/2019   Procedure: PORTA CATH REMOVAL;  Surgeon: Katha Cabal, MD;  Location: [facility];  Service: Cardiovascular;  Laterality: N/A;  . PORTACATH PLACEMENT Right   . ROTATOR CUFF REPAIR Left   . TEMPORARY DIALYSIS CATHETER N/A 05/11/2018   Procedure: TEMPORARY DIALYSIS CATHETER;  Surgeon: Katha Cabal, MD;  Location: [facility];  Service: Cardiovascular;  Laterality: N/A;  . TEMPORARY DIALYSIS CATHETER Left 07/27/2018   Procedure: TEMPORARY DIALYSIS CATHETER;  Surgeon: Katha Cabal, MD;  Location: [facility];  Service: Cardiovascular;  Laterality: Left;  . TOTAL HIP ARTHROPLASTY Left 09/05/2019   Procedure: TOTAL HIP ARTHROPLASTY;  Surgeon: Corky Mull, MD;  Location: [facility];  Service: Orthopedics;  Laterality: Left;  . TRANSURETHRAL RESECTION OF BLADDER TUMOR N/A 02/17/2016   Procedure: TRANSURETHRAL RESECTION OF BLADDER TUMOR (TURBT)-LARGE;  Surgeon: Hollice Espy, MD;  Location: [facility];  Service: Urology;  Laterality: N/A;  . TRANSURETHRAL RESECTION OF BLADDER TUMOR N/A 10/07/2017   Procedure: TRANSURETHRAL RESECTION OF BLADDER TUMOR (TURBT)-small;  Surgeon: Hollice Espy, MD;  Location: [facility];  Service: Urology;  Laterality: N/A;  . URETEROSCOPY Left 02/17/2016   Procedure: URETEROSCOPY;  Surgeon: Hollice Espy, MD;  Location:  [facility];  Service: Urology;  Laterality: Left;  . URETEROSCOPY Right 10/12/2016   Procedure: URETEROSCOPY;  Surgeon: Hollice Espy, MD;  Location: [facility];  Service: Urology;  Laterality: Right;    Medications Prior to Admission  Medication Sig Dispense Refill Last Dose  . acetaminophen (TYLENOL) 500 MG tablet Take 1,000 mg by mouth 2 (two) times daily as needed for moderate pain or headache.    09/04/2019 at Unknown time  .  albuterol (PROVENTIL HFA;VENTOLIN HFA) 108 (90 Base) MCG/ACT inhaler Inhale 2 puffs into the lungs every 6 (six) hours as needed for wheezing or shortness of breath.    09/05/2019 at Unknown time  . atorvastatin (LIPITOR) 40 MG tablet Take 40 mg by mouth every evening.   09/04/2019 at Unknown time  . calcium acetate (PHOSLO) 667 MG capsule Take 2 capsules (1,334 mg total) by mouth 3 (three) times daily with meals. (Patient taking differently: Take 1,334 mg by mouth 3 (three) times daily. ) 90 capsule 0 09/04/2019 at Unknown time  . calcium carbonate (TUMS - DOSED IN MG ELEMENTAL CALCIUM) 500 MG chewable tablet Chew 1 tablet by mouth daily.   09/04/2019 at Unknown time  . cholecalciferol (VITAMIN D) 1000 units tablet Take 1,000 Units by mouth daily.   09/04/2019 at Unknown time  . cinacalcet (SENSIPAR) 30 MG tablet Take 30 mg by mouth daily. With largest meal   09/04/2019 at Unknown time  . esomeprazole (NEXIUM) 40 MG capsule Take 40 mg by mouth daily after breakfast.    09/04/2019 at Unknown time  . ferrous sulfate 325 (65 FE) MG EC tablet Take 325 mg by mouth daily after breakfast.    09/04/2019 at Unknown time  . furosemide (LASIX) 40 MG tablet Take 40 mg by mouth daily.    09/04/2019 at Unknown time  . gabapentin (NEURONTIN) 300 MG capsule Take 600 mg by mouth 3 (three) times daily.   09/04/2019 at Unknown time  . ibuprofen (ADVIL) 200 MG tablet Take 200 mg by mouth every 8 (eight) hours as needed (for pain.).    Past Week at Unknown time  . loperamide (IMODIUM) 1 MG/5ML solution Take 2 mg by mouth as needed for diarrhea or loose stools.   09/04/2019 at Unknown time  . losartan (COZAAR) 50 MG tablet Take 50 mg by mouth every evening.   09/04/2019 at Unknown time  . multivitamin (RENA-VIT) TABS tablet Take 1 tablet by mouth at bedtime.  0 09/04/2019 at Unknown time  . oxybutynin (DITROPAN) 5 MG tablet Take 5 mg by mouth 3 (three) times daily.   09/04/2019 at Unknown time  . vitamin C (VITAMIN C) 500 MG tablet  Take 1 tablet (500 mg total) by mouth 2 (two) times daily.   09/04/2019 at Unknown time  . Blood Glucose Monitoring Suppl (ACCU-CHEK AVIVA PLUS) w/Device KIT (USE TO MONITOR BLOOD SUGARS.)  0   . NON FORMULARY Place 1 Units into the nose at bedtime. CPAP Time of use 2100-0600     . Nutritional Supplements (FEEDING SUPPLEMENT, NEPRO CARB STEADY,) LIQD Take 237 mLs by mouth 2 (two) times daily between meals.  0   . OXYGEN Inhale 2 L into the lungs at bedtime.      Social History   Socioeconomic History  . Marital status: Married    Spouse name: Not on file  . Number of children: Not on file  . Years of education: Not on file  . Highest education level: Not on file  Occupational History  .  Occupation: retired  Tobacco Use  . Smoking status: Former Smoker    Types: Cigarettes    Quit date: 07/20/2002    Years since quitting: 17.1  . Smokeless tobacco: Never Used  . Tobacco comment: 01/22/2016   "  quit smoking many years ago "  Substance and Sexual Activity  . Alcohol use: No  . Drug use: No  . Sexual activity: Never  Other Topics Concern  . Not on file  Social History Narrative  . Not on file   Social Determinants of Health   Financial Resource Strain:   . Difficulty of Paying Living Expenses: Not on file  Food Insecurity:   . Worried About Charity fundraiser in the Last Year: Not on file  . Ran Out of Food in the Last Year: Not on file  Transportation Needs:   . Lack of Transportation (Medical): Not on file  . Lack of Transportation (Non-Medical): Not on file  Physical Activity:   . Days of Exercise per Week: Not on file  . Minutes of Exercise per Session: Not on file  Stress:   . Feeling of Stress : Not on file  Social Connections:   . Frequency of Communication with Friends and Family: Not on file  . Frequency of Social Gatherings with Friends and Family: Not on file  . Attends Religious Services: Not on file  . Active Member of Clubs or Organizations: Not on file  .  Attends Archivist Meetings: Not on file  . Marital Status: Not on file  Intimate Partner Violence:   . Fear of Current or Ex-Partner: Not on file  . Emotionally Abused: Not on file  . Physically Abused: Not on file  . Sexually Abused: Not on file    Family History  Problem Relation Age of Onset  . Diabetes Mother   . Cancer Father   . Breast cancer Neg Hx   . Kidney disease Neg Hx       Review of systems complete and found to be negative unless listed above      PHYSICAL EXAM  General: Well developed, well nourished, in no acute distress HEENT:  Normocephalic and atramatic Neck:  No JVD.  Lungs: Clear bilaterally to auscultation and percussion. Heart: HRRR . Normal S1 and S2 without gallops or murmurs.  Abdomen: Bowel sounds are positive, abdomen soft and non-tender  Msk:  Back normal, normal gait. Normal strength and tone for age. Extremities: No clubbing, cyanosis or edema.   Neuro: Alert and oriented X 3. Psych:  Good affect, responds appropriately  Labs:   Lab Results  Component Value Date   WBC 11.9 (H) 09/07/2019   HGB 10.9 (L) 09/07/2019   HCT 34.5 (L) 09/07/2019   MCV 100.0 09/07/2019   PLT 120 (L) 09/07/2019    Recent Labs  Lab 09/07/19 0529 09/07/19 0529 09/07/19 1105  NA 136  --   --   K 2.9*   < > 4.2  CL 95*  --   --   CO2 25  --   --   BUN 49*  --   --   CREATININE 10.09*  --   --   CALCIUM 7.4*  --   --   GLUCOSE 122*  --   --    < > = values in this interval not displayed.   Lab Results  Component Value Date   CKTOTAL 255 (H) 08/03/2012   CKMB < 0.5 (L) 08/03/2012   TROPONINI <  0.03 01/30/2018    Lab Results  Component Value Date   CHOL 100 08/03/2012   CHOL 113 09/20/2011   Lab Results  Component Value Date   HDL 31 (L) 08/03/2012   HDL 20 (L) 09/20/2011   Lab Results  Component Value Date   LDLCALC 22 08/03/2012   LDLCALC SEE COMMENT 09/20/2011   Lab Results  Component Value Date   TRIG 235 (H) 08/03/2012    TRIG 546 (H) 09/20/2011   No results found for: CHOLHDL No results found for: LDLDIRECT    Radiology: EEG  Result Date: 09/06/2019 Alexis Goodell, MD     09/06/2019  5:06 PM ELECTROENCEPHALOGRAM REPORT Patient: Dyke Maes       Room #: IC01A-AA EEG No. ID: [id number] Age: 79 y.o.        Sex: female Requesting Physician: Kasa Report Date:  09/06/2019       Interpreting Physician: Alexis Goodell History: PATIRICA LONGSHORE is an 79 y.o. female with new onset seizures and altered mental status Medications: Lipitor, Neurontin, Ditropan, Fentanyl, Neosynephrine Conditions of Recording:  This is a 21 channel routine scalp EEG performed with bipolar and monopolar montages arranged in accordance to the international 10/20 system of electrode placement. One channel was dedicated to EKG recording. The patient is in the intubated and sedated state. Description:  The background activity is slow and poorly organized.  It consists of a low voltage polymorphic delta delta activity this is diffusely distributed.  There are intermittent generalized periods of attenuation noted as well occasionally during the recording.  These periods of attenuation are short, lasting up to 2 seconds.  Also, rare generalized polyspike activity is noted with frontal predominance. Hyperventilation and intermittent photic stimulation were not performed. IMPRESSION: This is an abnormal electroencephalogram secondary to discontinuous general background slowing with rare, superimposed, generalized polyspike activity with frontal predominance.  This suggest an underlying diffuse cerebral disturbance and frontal, cortical irritability.  No electrographic seizures are noted.  Alexis Goodell, MD Neurology [phone] 09/06/2019, 4:56 PM   CT Abdomen Pelvis Wo Contrast  Result Date: 08/24/2019 CLINICAL DATA:  Muscle invasive bladder cancer. End-stage renal disease. EXAM: CT ABDOMEN AND PELVIS WITHOUT CONTRAST TECHNIQUE: Multidetector CT imaging of the  abdomen and pelvis was performed following the standard protocol without IV contrast. COMPARISON:  05/11/2019 and cystogram 05/22/2019 FINDINGS: Lower chest: Please see Dr. Leonia Reeves' CT chest report from today's imaging. Hepatobiliary: Unremarkable Pancreas: Unremarkable Spleen: Unremarkable Adrenals/Urinary Tract: Both adrenal glands appear normal. Atrophic kidneys bilaterally. Prominent left hydronephrosis and left hydroureter extending down to the pelvis, indistinct left distal ureter adjacent to the left ovary and urinary bladder. I do not see a ureteral stone but tumor in the distal ureter is not excluded. No right hydronephrosis is evident. Prominent urinary bladder wall circumferential thickening with single wall thickness up to 2.6 cm, previously about 2.4 cm. As before, there is confluent density between the left posterior urinary bladder and the left adnexa, and tumor extending up towards the adnexa is not excluded although the amount of soft tissue density in this vicinity appears reduced. Stomach/Bowel: Transverse duodenal diverticulum. Distal rectal wall thickening possibly from nondistention or radiation therapy. Prominent stool throughout the colon favors constipation. Vascular/Lymphatic: Aortoiliac atherosclerotic vascular disease. Stable 1.5 cm in short axis right common iliac node on image 47/2. Reproductive: Uterus absent. As noted above, the previous soft tissue density extending from the bladder wall up towards the left adnexa appears subjectively significantly reduced from prior. Other: No supplemental non-categorized  findings. 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 Subcortical sclerosis and degenerative subcortical cysts noted. Grade 1 anterolisthesis at L3-4. Multilevel degenerative facet arthropathy in the lumbar spine. IMPRESSION: 1.  Prominent wall thickening in the urinary bladder diffusely is again observed potentially related to tumor and cystitis. This thickening is circumferential and measures 2.6 cm in single wall thickness, previously 2.4 cm. However, the soft tissue density extending from the dome of the urinary bladder up towards the left adnexa appears significantly improved. 2. Prominent left hydronephrosis and hydroureter extending down to the level of the urinary bladder where there is confluent soft tissue density extending up towards the left adnexa. The amount of soft tissue density in this vicinity appears reduced. 3. Stable right common iliac adenopathy, 1.5 cm in short axis. 4. Prominent stool throughout the colon favors constipation. 5. 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 6. Distal rectal wall thickening possibly from nondistention and/or radiation therapy. 7.  Aortic Atherosclerosis (ICD10-I70.0). 8. Multilevel degenerative facet arthropathy in the lumbar spine. Electronically Signed   By: Van Clines M.D.   On: 08/24/2019 16:08   DG Chest 1 View  Result Date: 09/06/2019 CLINICAL DATA:  Status post intubation. EXAM: CHEST  1 VIEW COMPARISON:  Single-view of the chest earlier today. FINDINGS: New endotracheal tube is in place with the tip in good position 2.5 cm above the carina. NG tube courses into the stomach and below the inferior margin the film. Bibasilar atelectasis again seen. There is cardiomegaly. Vascular stent noted. IMPRESSION: ETT in good position. No other change compared to the exam earlier today. Electronically Signed   By: Inge Rise M.D.   On: 09/06/2019 14:22   DG Abd 1 View  Result Date: 09/06/2019 CLINICAL DATA:  OG tube placement EXAM: ABDOMEN - 1 VIEW COMPARISON:  None. FINDINGS: Enteric tube passes into the stomach. Bowel gas pattern is unremarkable. IMPRESSION:  Enteric tube is within the stomach. Electronically Signed   By: Macy Mis M.D.   On: 09/06/2019 14:21   CT CHEST WO CONTRAST  Result Date: 08/24/2019 CLINICAL DATA:  Muscle invasive bladder cancer. EXAM: CT CHEST WITHOUT CONTRAST TECHNIQUE: Multidetector CT imaging of the chest was performed following the standard protocol without IV contrast. COMPARISON:  CT abdomen pelvis same day, PET-CT 07/25/2018 FINDINGS: Cardiovascular: Coronary artery calcification and aortic atherosclerotic calcification. Stent in the brachycephalic vein. Mediastinum/Nodes: No axillary supraclavicular adenopathy. No mediastinal adenopathy. No pericardial effusion. Esophagus normal. Lungs/Pleura: 5 mm nodule in the LEFT lower lobe (image 94/3) is unchanged from PET-CT scan 07/25/2018 there is bilateral bibasilar bronchiectasis. Upper Abdomen: Limited view of the liver, kidneys, pancreas are unremarkable. Normal adrenal glands. Musculoskeletal: No aggressive osseous lesion. Degenerative sclerosis in the midthoracic spine IMPRESSION: 1. No evidence of pulmonary metastasis. 2. Stable LEFT lobe pulmonary nodule. 3. Coronary artery calcification and Aortic Atherosclerosis (ICD10-I70.0). Electronically Signed   By: Suzy Bouchard M.D.   On: 08/24/2019 15:35   MR BRAIN WO CONTRAST  Result Date: 09/07/2019 CLINICAL DATA:  Ataxia. Unresponsive. Seizure like activity. Metabolic acidosis. EXAM: MRI HEAD WITHOUT CONTRAST TECHNIQUE: Multiplanar, multiecho pulse sequences of the brain and surrounding structures were obtained without intravenous contrast. COMPARISON:  05/14/2019 FINDINGS: Brain: Diffusion imaging does not show any acute or subacute infarction. No focal abnormality affects the brainstem. No focal cerebellar finding. Cerebral hemispheres show  age related atrophy with chronic small-vessel ischemic changes of the white matter. No large vessel territory infarction. No mass lesion, hemorrhage, hydrocephalus or extra-axial  collection. Vascular: Major vessels at the base of the brain show flow. Skull and upper cervical spine: Negative Sinuses/Orbits: Clear/normal Other: None IMPRESSION: No change since the study of last October. No acute or reversible finding. Age related atrophy. Chronic small-vessel ischemic changes. Electronically Signed   By: Nelson Chimes M.D.   On: 09/07/2019 01:48   DG Chest Port 1 View  Result Date: 09/06/2019 CLINICAL DATA:  Respiratory failure. EXAM: PORTABLE CHEST 1 VIEW COMPARISON:  CT chest 08/24/2019 and chest radiograph 05/11/2019. FINDINGS: Trachea is midline. Heart is enlarged, stable. Thoracic aorta is calcified. A stent is seen in the left brachiocephalic and subclavian veins. Lungs are low in volume which may accentuate pulmonary markings. No airspace consolidation or definite pleural fluid. IMPRESSION: 1. Low lung volumes likely accentuate pulmonary markings. Difficult to definitively exclude pulmonary edema. 2.  Aortic atherosclerosis (ICD10-I70.0). Electronically Signed   By: Lorin Picket M.D.   On: 09/06/2019 09:11   DG HIP UNILAT W OR W/O PELVIS 2-3 VIEWS LEFT  Result Date: 09/05/2019 CLINICAL DATA:  Hip replacement, pain EXAM: DG HIP (WITH OR WITHOUT PELVIS) 2-3V LEFT COMPARISON:  07/28/2018 FINDINGS: Post left total hip arthroplasty. No evidence of complication. Residual postoperative air in the soft tissues. IMPRESSION: Post total left hip arthroplasty without evidence of complication. Electronically Signed   By: Macy Mis M.D.   On: 09/05/2019 10:50    EKG: Normal sinus rhythm nonspecific ST-T wave changes  ASSESSMENT AND PLAN:  Bradycardia Pauses Sick sinus syndrome End-stage renal disease on dialysis COPD Diabetes Hypertension Dyspnea Respiratory failure Asthma Hypoxemia on supplemental oxygen Obstructive sleep apnea . Plan Agree with respiratory support on the vent Agree with critical care support N.p.o. for now NG tube in place Continue dialysis  therapy and management Agree with Foley to gravity Transfusion was necessary Continue GI prophylaxis No clear indication for permanent pacemaker Pauses and bradycardia probably related to respiratory failure Recommend conservative cardiac input at this point  Signed: Yolonda Kida MD 09/07/2019, 11:50 AM

## 2019-09-07 NOTE — Progress Notes (Signed)
Patient is s/p left total hip replacement, POD2.  Patient is sedated and intubated.  Skin incision to the left hip without drainage or evidence for infection.  Honeycomb dressing intact.  Will continue to monitor.   Raquel Zeph Riebel, PA-C [place]

## 2019-09-07 NOTE — Progress Notes (Signed)
[place] [facility] Electrolyte Monitoring and Replacement   Recent Labs: Potassium (mmol/L)  Date Value  09/07/2019 2.9 (L)  10/30/2014 3.2 (L)   Magnesium (mg/dL)  Date Value  09/07/2019 1.8  09/20/2011 1.9   Calcium (mg/dL)  Date Value  09/07/2019 7.4 (L)   Calcium, Total (mg/dL)  Date Value  08/07/2012 9.1   Albumin (g/dL)  Date Value  07/26/2019 3.9  08/02/2012 3.9   Phosphorus (mg/dL)  Date Value  09/07/2019 8.9 (H)   Sodium (mmol/L)  Date Value  09/07/2019 136  11/04/2016 138  08/07/2012 138     Assessment: 79 year old female s/p left total hip arthroplasty. Patient admitted to ICU post rapid response where patient was found to be unresponsive and required bipap. Noted to have seizure-like activity while receiving dialysis and was subsequently intubated for inability to protect airway. Pharmacy consulted to manage electrolytes.  Goal of Therapy:  Electrolytes WNL  Plan:  Potassium low this morning. Will replace conservatively with potassium 20 mEq per tube x 1 dose for now until Nephrology's plans for HD are known. If no plan for HD today, can consider giving additional 20 mEq later today.  Will continue to follow and manage electrolytes.  Tawnya Crook ,PharmD Clinical Pharmacist 09/07/2019 7:23 AM

## 2019-09-07 NOTE — Progress Notes (Signed)
[facility]  ROUNDING NOTE   Subjective:  Pt remains critically ill. Pressure was quite low this AM. Didn't tolerate dialysis well at all yesterday.   Objective:  Vital signs in last 24 hours:  Temp:  [98.4 F (36.9 C)-100.1 F (37.8 C)] 99.9 F (37.7 C) (02/18 1200) Pulse Rate:  [65-94] 77 (02/18 1315) Resp:  [14-18] 14 (02/18 1315) BP: (71-176)/(31-93) 129/38 (02/18 1300) SpO2:  [97 %-100 %] 100 % (02/18 1315) FiO2 (%):  [30 %-40 %] 30 % (02/18 1211)  Weight change:  Filed Weights   09/06/19 0747  Weight: 84.9 kg    Intake/Output: I/O last 3 completed shifts: In: 2747.8 [P.O.:120; I.V.:2331.3; IV Piggyback:296.5] Out: 100 [Emesis/NG output:100]   Intake/Output this shift:  Total I/O In: 616.7 [I.V.:531.7; IV Piggyback:85] Out: 0   Physical Exam: General: Critically ill-appearing  Head: ETT in place  Eyes: Anicteric  Neck: Supple, trachea midline  Lungs:  Scattered rhonchi, vent assisted  Heart: S1S2 no rubs  Abdomen:  Soft, nontender, bowel sounds present  Extremities: trace peripheral edema.  Neurologic: Intubated, sedated  Skin: No lesions  Access: Left upper extremity AV graft, femoral dialysis catheter    Basic Metabolic Panel: Recent Labs  Lab 09/05/19 0647 09/06/19 0532 09/06/19 0850 09/06/19 1752 09/07/19 0529 09/07/19 1105  NA 140 138  --   --  136  --   K 4.2 3.8  --   --  2.9* 4.2  CL 108 104  --   --  95*  --   CO2  --  20*  --   --  25  --   GLUCOSE 93 119*  --   --  122*  --   BUN 40* 49*  --   --  49*  --   CREATININE 8.80* 10.14*  --   --  10.09*  --   CALCIUM  --  7.6*  --   --  7.4*  --   MG  --   --   --  2.0 1.8 2.3  PHOS  --   --  8.8*  --  8.9*  --     Liver Function Tests: No results for input(s): AST, ALT, ALKPHOS, BILITOT, PROT, ALBUMIN in the last 168 hours. No results for input(s): LIPASE, AMYLASE in the last 168 hours. No results for input(s): AMMONIA in the last 168 hours.  CBC: Recent Labs  Lab  09/05/19 0647 09/05/19 1435 09/06/19 0532 09/07/19 0529  WBC  --  9.8 8.1 11.9*  HGB 15.0 11.6* 10.5* 10.9*  HCT 44.0 37.0 34.2* 34.5*  MCV  --  99.7 100.3* 100.0  PLT  --  146* 132* 120*    Cardiac Enzymes: No results for input(s): CKTOTAL, CKMB, CKMBINDEX, TROPONINI in the last 168 hours.  BNP: Invalid input(s): POCBNP  CBG: Recent Labs  Lab 09/07/19 0404 09/07/19 0453 09/07/19 0743 09/07/19 0907 09/07/19 1118  GLUCAP 63* 109* 88* 89 69    Microbiology: Results for orders placed or performed during the hospital encounter of 09/05/19  MRSA PCR Screening     Status: Abnormal   Collection Time: 09/06/19  7:50 AM   Specimen: Nasal Mucosa; Nasopharyngeal  Result Value Ref Range Status   MRSA by PCR POSITIVE (A) NEGATIVE Final    Comment:        The GeneXpert MRSA Assay (FDA approved for NASAL specimens only), is one component of a comprehensive MRSA colonization surveillance program. It is not intended to diagnose MRSA infection  nor to guide or monitor treatment for MRSA infections. RESULT CALLED TO, READ BACK BY AND VERIFIED WITH: MEGAN SHEFFIELD AT 0931 ON 09/06/2019 [place]. Performed at [facility], [address] [place], [place] [address]     Coagulation Studies: No results for input(s): LABPROT, INR in the last 72 hours.  Urinalysis: No results for input(s): COLORURINE, LABSPEC, PHURINE, GLUCOSEU, HGBUR, BILIRUBINUR, KETONESUR, PROTEINUR, UROBILINOGEN, NITRITE, LEUKOCYTESUR in the last 72 hours.  Invalid input(s): APPERANCEUR    Imaging: EEG  Result Date: 09/06/2019 Alexis Goodell, MD     09/06/2019  5:06 PM ELECTROENCEPHALOGRAM REPORT Patient: MEIYA WISLER       Room #: IC01A-AA EEG No. ID: [id number] Age: 79 y.o.        Sex: female Requesting Physician: Kasa Report Date:  09/06/2019       Interpreting Physician: Alexis Goodell History: AKEILA LANA is an 79 y.o. female with new onset seizures and altered mental status Medications: Lipitor,  Neurontin, Ditropan, Fentanyl, Neosynephrine Conditions of Recording:  This is a 21 channel routine scalp EEG performed with bipolar and monopolar montages arranged in accordance to the international 10/20 system of electrode placement. One channel was dedicated to EKG recording. The patient is in the intubated and sedated state. Description:  The background activity is slow and poorly organized.  It consists of a low voltage polymorphic delta delta activity this is diffusely distributed.  There are intermittent generalized periods of attenuation noted as well occasionally during the recording.  These periods of attenuation are short, lasting up to 2 seconds.  Also, rare generalized polyspike activity is noted with frontal predominance. Hyperventilation and intermittent photic stimulation were not performed. IMPRESSION: This is an abnormal electroencephalogram secondary to discontinuous general background slowing with rare, superimposed, generalized polyspike activity with frontal predominance.  This suggest an underlying diffuse cerebral disturbance and frontal, cortical irritability.  No electrographic seizures are noted.  Alexis Goodell, MD Neurology [phone] 09/06/2019, 4:56 PM   DG Chest 1 View  Result Date: 09/06/2019 CLINICAL DATA:  Status post intubation. EXAM: CHEST  1 VIEW COMPARISON:  Single-view of the chest earlier today. FINDINGS: New endotracheal tube is in place with the tip in good position 2.5 cm above the carina. NG tube courses into the stomach and below the inferior margin the film. Bibasilar atelectasis again seen. There is cardiomegaly. Vascular stent noted. IMPRESSION: ETT in good position. No other change compared to the exam earlier today. Electronically Signed   By: Inge Rise M.D.   On: 09/06/2019 14:22   DG Abd 1 View  Result Date: 09/06/2019 CLINICAL DATA:  OG tube placement EXAM: ABDOMEN - 1 VIEW COMPARISON:  None. FINDINGS: Enteric tube passes into the stomach. Bowel  gas pattern is unremarkable. IMPRESSION: Enteric tube is within the stomach. Electronically Signed   By: Macy Mis M.D.   On: 09/06/2019 14:21   MR BRAIN WO CONTRAST  Result Date: 09/07/2019 CLINICAL DATA:  Ataxia. Unresponsive. Seizure like activity. Metabolic acidosis. EXAM: MRI HEAD WITHOUT CONTRAST TECHNIQUE: Multiplanar, multiecho pulse sequences of the brain and surrounding structures were obtained without intravenous contrast. COMPARISON:  05/14/2019 FINDINGS: Brain: Diffusion imaging does not show any acute or subacute infarction. No focal abnormality affects the brainstem. No focal cerebellar finding. Cerebral hemispheres show age related atrophy with chronic small-vessel ischemic changes of the white matter. No large vessel territory infarction. No mass lesion, hemorrhage, hydrocephalus or extra-axial collection. Vascular: Major vessels at the base of the brain show flow. Skull and upper  cervical spine: Negative Sinuses/Orbits: Clear/normal Other: None IMPRESSION: No change since the study of last October. No acute or reversible finding. Age related atrophy. Chronic small-vessel ischemic changes. Electronically Signed   By: Nelson Chimes M.D.   On: 09/07/2019 01:48   DG Chest Port 1 View  Result Date: 09/06/2019 CLINICAL DATA:  Respiratory failure. EXAM: PORTABLE CHEST 1 VIEW COMPARISON:  CT chest 08/24/2019 and chest radiograph 05/11/2019. FINDINGS: Trachea is midline. Heart is enlarged, stable. Thoracic aorta is calcified. A stent is seen in the left brachiocephalic and subclavian veins. Lungs are low in volume which may accentuate pulmonary markings. No airspace consolidation or definite pleural fluid. IMPRESSION: 1. Low lung volumes likely accentuate pulmonary markings. Difficult to definitively exclude pulmonary edema. 2.  Aortic atherosclerosis (ICD10-I70.0). Electronically Signed   By: Lorin Picket M.D.   On: 09/06/2019 09:11   ECHOCARDIOGRAM COMPLETE  Result Date: 09/07/2019     ECHOCARDIOGRAM REPORT   Patient Name:   Barbara [name] Date of Exam: 09/07/2019 Medical Rec #:  [id number]     Height:       66.0 in Accession #:    [id number]    Weight:       187.2 lb Date of Birth:  12-15-1940      BSA:          1.94 m Patient Age:    61 years      BP:           140/38 mmHg Patient Gender: F             HR:           90 bpm. Exam Location:  [facility] Procedure: 2D Echo, Cardiac Doppler and Color Doppler Indications:     Dyspnea 786.09  History:         Patient has no prior history of Echocardiogram examinations.                  CAD, COPD; Risk Factors:Hypertension and Diabetes. CKD.  Sonographer:     Charmayne Sheer RDCS (AE) Referring Phys:  [id number] Flora Lipps Diagnosing Phys: Ida Rogue MD  Sonographer Comments: Echo performed with patient supine and on artificial respirator. IMPRESSIONS  1. Left ventricular ejection fraction, by estimation, is 60 to 65%. The left ventricle has normal function. The left ventricle has no regional wall motion abnormalities. Left ventricular diastolic parameters are consistent with Grade I diastolic dysfunction (impaired relaxation).  2. Right ventricular systolic function is normal. The right ventricular size is normal. Tricuspid regurgitation signal is inadequate for assessing PA pressure. FINDINGS  Left Ventricle: Left ventricular ejection fraction, by estimation, is 60 to 65%. The left ventricle has normal function. The left ventricle has no regional wall motion abnormalities. The left ventricular internal cavity size was normal in size. There is  no left ventricular hypertrophy. Left ventricular diastolic parameters are consistent with Grade I diastolic dysfunction (impaired relaxation). Right Ventricle: The right ventricular size is normal. No increase in right ventricular wall thickness. Right ventricular systolic function is normal. Tricuspid regurgitation signal is inadequate for assessing PA pressure. Left Atrium: Left atrial size was normal in size. Right  Atrium: Right atrial size was normal in size. Pericardium: There is no evidence of pericardial effusion. Mitral Valve: The mitral valve is normal in structure and function. Normal mobility of the mitral valve leaflets. No evidence of mitral valve regurgitation. No evidence of mitral valve stenosis. MV peak gradient, 4.5 mmHg. The mean mitral valve gradient is 2.0  mmHg. Tricuspid Valve: The tricuspid valve is normal in structure. Tricuspid valve regurgitation is trivial. No evidence of tricuspid stenosis. Aortic Valve: The aortic valve was not well visualized. Aortic valve regurgitation is not visualized. Mild to moderate aortic valve sclerosis/calcification is present, without any evidence of aortic stenosis. Aortic valve mean gradient measures 9.0 mmHg.  Aortic valve peak gradient measures 15.1 mmHg. Aortic valve area, by VTI measures 2.62 cm. Pulmonic Valve: The pulmonic valve was normal in structure. Pulmonic valve regurgitation is not visualized. No evidence of pulmonic stenosis. Aorta: The aortic root is normal in size and structure. Venous: The inferior vena cava is normal in size with greater than 50% respiratory variability, suggesting right atrial pressure of 3 mmHg. IAS/Shunts: No atrial level shunt detected by color flow Doppler.  LEFT VENTRICLE PLAX 2D LVIDd:         4.04 cm  Diastology LVIDs:         2.89 cm  LV e' lateral:   6.96 cm/s LV PW:         0.74 cm  LV E/e' lateral: 10.5 LV IVS:        0.75 cm  LV e' medial:    4.68 cm/s LVOT diam:     1.90 cm  LV E/e' medial:  15.6 LV SV:         69.46 ml LV SV Index:   19.73 LVOT Area:     2.84 cm  LEFT ATRIUM         Index LA diam:    2.00 cm 1.03 cm/m  AORTIC VALVE                    PULMONIC VALVE AV Area (Vmax):    2.10 cm     PV Vmax:       1.71 m/s AV Area (Vmean):   2.14 cm     PV Vmean:      114.000 cm/s AV Area (VTI):     2.62 cm     PV VTI:        0.285 m AV Vmax:           194.00 cm/s  PV Peak grad:  11.7 mmHg AV Vmean:          142.000 cm/s  PV Mean grad:  6.0 mmHg AV VTI:            0.265 m AV Peak Grad:      15.1 mmHg AV Mean Grad:      9.0 mmHg LVOT Vmax:         144.00 cm/s LVOT Vmean:        107.000 cm/s LVOT VTI:          0.245 m LVOT/AV VTI ratio: 0.92  AORTA Ao Root diam: 3.00 cm MITRAL VALVE MV Area (PHT): 3.77 cm     SHUNTS MV Peak grad:  4.5 mmHg     Systemic VTI:  0.24 m MV Mean grad:  2.0 mmHg     Systemic Diam: 1.90 cm MV Vmax:       1.06 m/s MV Vmean:      73.0 cm/s MV Decel Time: 201 msec MV E velocity: 72.80 cm/s MV A velocity: 105.00 cm/s MV E/A ratio:  0.69 Ida Rogue MD Electronically signed by Ida Rogue MD Signature Date/Time: 09/07/2019/12:56:14 PM    Final      Medications:   . sodium chloride Stopped (09/06/19 0721)  . sodium chloride    .  sodium chloride    . sodium chloride    . DOPamine    . DOPamine 4 mcg/kg/min (09/07/19 1213)  . fentaNYL infusion INTRAVENOUS 175 mcg/hr (09/07/19 1213)  . lacosamide (VIMPAT) IV Stopped (09/07/19 0709)  . phenylephrine (NEO-SYNEPHRINE) Adult infusion 35 mcg/min (09/07/19 1213)  .  sodium bicarbonate (isotonic) infusion in sterile water 75 mL/hr at 09/07/19 1213   . ascorbic acid  500 mg Oral BID  . atorvastatin  40 mg Oral QPM  . atropine  1 mg Intravenous Once  . B-complex with vitamin C  1 tablet Per Tube QHS  . calcium acetate  1,334 mg Oral TID WC  . calcium carbonate  1 tablet Oral Daily  . chlorhexidine gluconate (MEDLINE KIT)  15 mL Mouth Rinse BID  . Chlorhexidine Gluconate Cloth  6 each Topical Q0600  . cholecalciferol  1,000 Units Oral Daily  . cinacalcet  30 mg Oral Daily  . dextrose  1 ampule Intravenous Once  . docusate sodium  100 mg Oral BID  . feeding supplement (PRO-STAT SUGAR FREE 64)  60 mL Per Tube BID  . feeding supplement (VITAL 1.5 CAL)  1,000 mL Per Tube Q24H  . ferrous sulfate  325 mg Oral QPC breakfast  . gabapentin  600 mg Oral TID  . heparin injection (subcutaneous)  5,000 Units Subcutaneous Q8H  . mouth rinse  15 mL Mouth  Rinse 10 times per day  . oxybutynin  5 mg Oral TID  . pantoprazole  40 mg Oral Daily  . potassium chloride  20 mEq Oral Once   sodium chloride, sodium chloride, acetaminophen, albuterol, alteplase, bisacodyl, diphenhydrAMINE, heparin, hydrALAZINE, HYDROcodone-acetaminophen, lidocaine (PF), lidocaine-prilocaine, loperamide HCl, magnesium hydroxide, metoCLOPramide **OR** metoCLOPramide (REGLAN) injection, midazolam, morphine injection, ondansetron **OR** ondansetron (ZOFRAN) IV, pentafluoroprop-tetrafluoroeth, sodium phosphate, traMADol  Assessment/ Plan:  79 y.o. female with end stage renal disease on hemodialysis, hypertension, diabetes mellitus type II, diabetic neuropathy who was admitted for left hip repair and unfortunate developed seizure episode postoperatively and subsequently intubated.  [facility] MWF-1/[facility]/LUE AVG  1.  ESRD on HD MWF.  Patient remains hypotensive at the moment.  She did not tolerate routine dialysis very well yesterday.  Also has developed some element of respiratory acidosis.  Recommend initiation of CRRT at this time.  We will keep the patient net even.  2.  Acute respiratory failure.  Developed seizure disorder and it was felt that she could not protect her airway.   -Continue mechanical ventilation at this time.  3.  Anemia of chronic kidney disease.  Hgb stable at 10.9, hold off on epogen at this time.   4.  Secondary hyperparathyroidism.  Phosphorus remains quite high at 8.9.  Continue to periodically monitor.  Should come down with CRRT.     LOS: 2 Erleen Egner 2/18/20211:42 PM

## 2019-09-07 NOTE — Progress Notes (Signed)
OT Cancellation Note  Patient Details Name: Barbara [name] MRN: [id number] DOB: 11-05-1940   Cancelled Treatment:    Reason Eval/Treat Not Completed: Medical issues which prohibited therapy;Other (comment). OT continues to follow this pt remotely. Pt noted to remain in ICU overnight. Per MD notes, pt has been intubated and is unresponsive. Will complete OT order at this time. Please re-consult as pt condition improves, and pt medically appropriate for OT evaluation.   Shara Blazing, M.S., OTR/L Ascom: [phone] 09/07/19, 8:13 AM

## 2019-09-08 ENCOUNTER — Other Ambulatory Visit: Payer: Self-pay | Admitting: Urology

## 2019-09-08 DIAGNOSIS — R569 Unspecified convulsions: Secondary | ICD-10-CM

## 2019-09-08 DIAGNOSIS — E1121 Type 2 diabetes mellitus with diabetic nephropathy: Secondary | ICD-10-CM

## 2019-09-08 DIAGNOSIS — J9602 Acute respiratory failure with hypercapnia: Secondary | ICD-10-CM

## 2019-09-08 DIAGNOSIS — G9340 Encephalopathy, unspecified: Secondary | ICD-10-CM

## 2019-09-08 LAB — RENAL FUNCTION PANEL
Albumin: 2.4 g/dL — ABNORMAL LOW (ref 3.5–5.0)
Albumin: 2.5 g/dL — ABNORMAL LOW (ref 3.5–5.0)
Anion gap: 11 (ref 5–15)
Anion gap: 9 (ref 5–15)
BUN: 43 mg/dL — ABNORMAL HIGH (ref 8–23)
BUN: 30 mg/dL — ABNORMAL HIGH (ref 8–23)
CO2: 29 mmol/L (ref 22–32)
CO2: 30 mmol/L (ref 22–32)
Calcium: 7.7 mg/dL — ABNORMAL LOW (ref 8.9–10.3)
Calcium: 7.6 mg/dL — ABNORMAL LOW (ref 8.9–10.3)
Chloride: 95 mmol/L — ABNORMAL LOW (ref 98–111)
Chloride: 96 mmol/L — ABNORMAL LOW (ref 98–111)
Creatinine, Ser: 7.2 mg/dL — ABNORMAL HIGH (ref 0.44–1.00)
Creatinine, Ser: 4.31 mg/dL — ABNORMAL HIGH (ref 0.44–1.00)
GFR calc Af Amer: 6 mL/min — ABNORMAL LOW
GFR calc Af Amer: 11 mL/min — ABNORMAL LOW (ref >60)
GFR calc non Af Amer: 5 mL/min — ABNORMAL LOW
GFR calc non Af Amer: 9 mL/min — ABNORMAL LOW (ref >60)
Glucose, Bld: 127 mg/dL — ABNORMAL HIGH (ref 70–99)
Glucose, Bld: 137 mg/dL — ABNORMAL HIGH (ref 70–99)
Phosphorus: 6.4 mg/dL — ABNORMAL HIGH (ref 2.5–4.6)
Phosphorus: 3.6 mg/dL (ref 2.5–4.6)
Potassium: 3.7 mmol/L (ref 3.5–5.1)
Potassium: 4 mmol/L (ref 3.5–5.1)
Sodium: 135 mmol/L (ref 135–145)
Sodium: 135 mmol/L (ref 135–145)

## 2019-09-08 LAB — BASIC METABOLIC PANEL WITH GFR
Anion gap: 12 (ref 5–15)
BUN: 47 mg/dL — ABNORMAL HIGH (ref 8–23)
CO2: 28 mmol/L (ref 22–32)
Calcium: 7.7 mg/dL — ABNORMAL LOW (ref 8.9–10.3)
Chloride: 94 mmol/L — ABNORMAL LOW (ref 98–111)
Creatinine, Ser: 7.16 mg/dL — ABNORMAL HIGH (ref 0.44–1.00)
GFR calc Af Amer: 6 mL/min — ABNORMAL LOW
GFR calc non Af Amer: 5 mL/min — ABNORMAL LOW
Glucose, Bld: 128 mg/dL — ABNORMAL HIGH (ref 70–99)
Potassium: 3.7 mmol/L (ref 3.5–5.1)
Sodium: 134 mmol/L — ABNORMAL LOW (ref 135–145)

## 2019-09-08 LAB — GLUCOSE, CAPILLARY
Glucose-Capillary: 101 mg/dL — ABNORMAL HIGH (ref 70–99)
Glucose-Capillary: 101 mg/dL — ABNORMAL HIGH (ref 70–99)
Glucose-Capillary: 102 mg/dL — ABNORMAL HIGH (ref 70–99)
Glucose-Capillary: 109 mg/dL — ABNORMAL HIGH (ref 70–99)
Glucose-Capillary: 116 mg/dL — ABNORMAL HIGH (ref 70–99)
Glucose-Capillary: 94 mg/dL (ref 70–99)

## 2019-09-08 LAB — CORTISOL: Cortisol, Plasma: 16.8 ug/dL

## 2019-09-08 LAB — T4, FREE: Free T4: 0.99 ng/dL (ref 0.61–1.12)

## 2019-09-08 LAB — PHOSPHORUS: Phosphorus: 6.5 mg/dL — ABNORMAL HIGH (ref 2.5–4.6)

## 2019-09-08 LAB — MAGNESIUM: Magnesium: 2.3 mg/dL (ref 1.7–2.4)

## 2019-09-08 LAB — TSH: TSH: 0.186 u[IU]/mL — ABNORMAL LOW (ref 0.350–4.500)

## 2019-09-08 MED ORDER — DEXAMETHASONE SODIUM PHOSPHATE 4 MG/ML IJ SOLN
4.0000 mg | Freq: Four times a day (QID) | INTRAMUSCULAR | Status: DC
  Administered 2019-09-09 – 2019-09-10 (×6): 4 mg via INTRAVENOUS
  Filled 2019-09-08 (×6): qty 1

## 2019-09-08 MED ORDER — ORAL CARE MOUTH RINSE
15.0000 mL | Freq: Two times a day (BID) | OROMUCOSAL | Status: DC
  Administered 2019-09-08 – 2019-09-24 (×25): 15 mL via OROMUCOSAL

## 2019-09-08 MED ORDER — DEXAMETHASONE SODIUM PHOSPHATE 10 MG/ML IJ SOLN
10.0000 mg | Freq: Once | INTRAMUSCULAR | Status: AC
  Administered 2019-09-08: 10 mg via INTRAVENOUS
  Filled 2019-09-08: qty 1

## 2019-09-08 MED ORDER — RACEPINEPHRINE HCL 2.25 % IN NEBU
0.5000 mL | INHALATION_SOLUTION | RESPIRATORY_TRACT | Status: DC | PRN
  Administered 2019-09-08: 0.5 mL via RESPIRATORY_TRACT
  Filled 2019-09-08: qty 0.5

## 2019-09-08 MED ORDER — IPRATROPIUM-ALBUTEROL 0.5-2.5 (3) MG/3ML IN SOLN
3.0000 mL | RESPIRATORY_TRACT | Status: DC | PRN
  Administered 2019-09-08: 3 mL via RESPIRATORY_TRACT
  Filled 2019-09-08: qty 3

## 2019-09-08 NOTE — Progress Notes (Signed)
Starting at 1400, PFR adjusted to meet 24 hour net 0 goal.

## 2019-09-08 NOTE — Progress Notes (Signed)
Subjective: Patient remains intubated and sedated.  Did not tolerate HD and now on CRRT.    Objective: Current vital signs: BP (!) 79/35   Pulse 89   Temp 99 F (37.2 C) (Oral)   Resp 14   Ht 5' 6" (1.676 m)   Wt 84.9 kg   SpO2 98%   BMI 30.21 kg/m  Vital signs in last 24 hours: Temp:  [97.8 F (36.6 C)-99.9 F (37.7 C)] 99 F (37.2 C) (02/19 0800) Pulse Rate:  [71-91] 89 (02/19 0645) Resp:  [13-20] 14 (02/19 0645) BP: (53-176)/(32-52) 79/35 (02/19 0645) SpO2:  [91 %-100 %] 98 % (02/19 0800) FiO2 (%):  [24 %-40 %] 24 % (02/19 0800) Weight:  [84.9 kg] 84.9 kg (02/18 1600)  Intake/Output from previous day: 02/18 0701 - 02/19 0700 In: 3456.5 [I.V.:2475.5; NG/GT:821; IV Piggyback:120] Out: 2112  Intake/Output this shift: Total I/O In: 467.7 [I.V.:317.7; NG/GT:150] Out: 480 [Other:480] Nutritional status:  Diet Order            Diet NPO time specified  Diet effective now              Neurologic Exam: Mental Status: Patient does not respond to verbal stimuli.  Does not respond to deep sternal rub.  Actively resists movement of head.  Does not follow commands.  No verbalizations noted.  Cranial Nerves: II: patient does not respond confrontation bilaterally, pupils right 3 mm, left 3 mm,and poorly reactive bilaterally III,IV,VI: Oculocephalic response present bilaterally.  V,VII: corneal reflex present bilaterally  VIII: patient does not respond to verbal stimuli IX,X: gag reflex unable to test, XI: trapezius strength unable to test bilaterally XII: tongue strength unable to test Motor: Extremities flaccid throughout.  No spontaneous movement noted.  No purposeful movements noted. Sensory: Does not respond to noxious stimuli in any extremity.   Lab Results: Basic Metabolic Panel: Recent Labs  Lab 09/05/19 0647 09/05/19 3546 09/06/19 0532 09/06/19 0532 09/06/19 0850 09/06/19 1752 09/07/19 0529 09/07/19 1105 09/07/19 1655 09/08/19 0547  NA 140  --  138   --   --   --  136  --  134* 135  134*  K 4.2   < > 3.8  --   --   --  2.9* 4.2 3.3* 3.7  3.7  CL 108  --  104  --   --   --  95*  --  91* 95*  94*  CO2  --   --  20*  --   --   --  25  --  _0 GLUCOSE 93  --  119*  --   --   --  122*  --  133* 127*  128*  BUN 40*  --  49*  --   --   --  49*  --  58* 43*  47*  CREATININE 8.80*  --  10.14*  --   --   --  10.09*  --  10.66* 7.20*  7.16*  CALCIUM  --   --  7.6*   < >  --   --  7.4*  --  7.5* 7.7*  7.7*  MG  --   --   --   --   --  2.0 1.8 2.3  --  2.3  PHOS  --   --   --   --  8.8*  --  8.9*  --  9.1* 6.4*  6.5*   < > = values in this  interval not displayed.    Liver Function Tests: Recent Labs  Lab 09/07/19 1655 09/08/19 0547  ALBUMIN 2.8* 2.4*   No results for input(s): LIPASE, AMYLASE in the last 168 hours. No results for input(s): AMMONIA in the last 168 hours.  CBC: Recent Labs  Lab 09/05/19 0647 09/05/19 1435 09/06/19 0532 09/07/19 0529  WBC  --  9.8 8.1 11.9*  HGB 15.0 11.6* 10.5* 10.9*  HCT 44.0 37.0 34.2* 34.5*  MCV  --  99.7 100.3* 100.0  PLT  --  146* 132* 120*    Cardiac Enzymes: No results for input(s): CKTOTAL, CKMB, CKMBINDEX, TROPONINI in the last 168 hours.  Lipid Panel: No results for input(s): CHOL, TRIG, HDL, CHOLHDL, VLDL, LDLCALC in the last 168 hours.  CBG: Recent Labs  Lab 09/07/19 1605 09/07/19 1958 09/08/19 0040 09/08/19 0440 09/08/19 0727  GLUCAP 111* 136* 101* 116* 101*    Microbiology: Results for orders placed or performed during the hospital encounter of 09/05/19  MRSA PCR Screening     Status: Abnormal   Collection Time: 09/06/19  7:50 AM   Specimen: Nasal Mucosa; Nasopharyngeal  Result Value Ref Range Status   MRSA by PCR POSITIVE (A) NEGATIVE Final    Comment:        The GeneXpert MRSA Assay (FDA approved for NASAL specimens only), is one component of a comprehensive MRSA colonization surveillance program. It is not intended to diagnose MRSA infection  nor to guide or monitor treatment for MRSA infections. RESULT CALLED TO, READ BACK BY AND VERIFIED WITH: MEGAN SHEFFIELD AT 0931 ON 09/06/2019 [place]. Performed at [facility], [address] [place], [place] [address]     Coagulation Studies: No results for input(s): LABPROT, INR in the last 72 hours.  Imaging: EEG  Result Date: 09/06/2019 Barbara Goodell, MD     09/06/2019  5:06 PM ELECTROENCEPHALOGRAM REPORT Patient: Barbara [name]       Room #: IC01A-AA EEG No. ID: [id number] Age: 79 y.o.        Sex: female Requesting Physician: Kasa Report Date:  09/06/2019       Interpreting Physician: Barbara [name] History: Barbara [name] is an 79 y.o. female with new onset seizures and altered mental status Medications: Lipitor, Neurontin, Ditropan, Fentanyl, Neosynephrine Conditions of Recording:  This is a 21 channel routine scalp EEG performed with bipolar and monopolar montages arranged in accordance to the international 10/20 system of electrode placement. One channel was dedicated to EKG recording. The patient is in the intubated and sedated state. Description:  The background activity is slow and poorly organized.  It consists of a low voltage polymorphic delta delta activity this is diffusely distributed.  There are intermittent generalized periods of attenuation noted as well occasionally during the recording.  These periods of attenuation are short, lasting up to 2 seconds.  Also, rare generalized polyspike activity is noted with frontal predominance. Hyperventilation and intermittent photic stimulation were not performed. IMPRESSION: This is an abnormal electroencephalogram secondary to discontinuous general background slowing with rare, superimposed, generalized polyspike activity with frontal predominance.  This suggest an underlying diffuse cerebral disturbance and frontal, cortical irritability.  No electrographic seizures are noted.  Barbara Goodell, MD Neurology [phone] 09/06/2019, 4:56  PM   DG Chest 1 View  Result Date: 09/06/2019 CLINICAL DATA:  Status post intubation. EXAM: CHEST  1 VIEW COMPARISON:  Single-view of the chest earlier today. FINDINGS: New endotracheal tube is in place with the tip in good position 2.5 cm  above the carina. NG tube courses into the stomach and below the inferior margin the film. Bibasilar atelectasis again seen. There is cardiomegaly. Vascular stent noted. IMPRESSION: ETT in good position. No other change compared to the exam earlier today. Electronically Signed   By: Inge Rise M.D.   On: 09/06/2019 14:22   DG Abd 1 View  Result Date: 09/06/2019 CLINICAL DATA:  OG tube placement EXAM: ABDOMEN - 1 VIEW COMPARISON:  None. FINDINGS: Enteric tube passes into the stomach. Bowel gas pattern is unremarkable. IMPRESSION: Enteric tube is within the stomach. Electronically Signed   By: Macy Mis M.D.   On: 09/06/2019 14:21   MR BRAIN WO CONTRAST  Result Date: 09/07/2019 CLINICAL DATA:  Ataxia. Unresponsive. Seizure like activity. Metabolic acidosis. EXAM: MRI HEAD WITHOUT CONTRAST TECHNIQUE: Multiplanar, multiecho pulse sequences of the brain and surrounding structures were obtained without intravenous contrast. COMPARISON:  05/14/2019 FINDINGS: Brain: Diffusion imaging does not show any acute or subacute infarction. No focal abnormality affects the brainstem. No focal cerebellar finding. Cerebral hemispheres show age related atrophy with chronic small-vessel ischemic changes of the white matter. No large vessel territory infarction. No mass lesion, hemorrhage, hydrocephalus or extra-axial collection. Vascular: Major vessels at the base of the brain show flow. Skull and upper cervical spine: Negative Sinuses/Orbits: Clear/normal Other: None IMPRESSION: No change since the study of last October. No acute or reversible finding. Age related atrophy. Chronic small-vessel ischemic changes. Electronically Signed   By: Nelson Chimes M.D.   On: 09/07/2019 01:48    ECHOCARDIOGRAM COMPLETE  Result Date: 09/07/2019    ECHOCARDIOGRAM REPORT   Patient Name:   Barbara [name] Date of Exam: 09/07/2019 Medical Rec #:  [id number]     Height:       66.0 in Accession #:    [id number]    Weight:       187.2 lb Date of Birth:  1941-01-12      BSA:          1.94 m Patient Age:    79 years      BP:           140/38 mmHg Patient Gender: F             HR:           90 bpm. Exam Location:  [facility] Procedure: 2D Echo, Cardiac Doppler and Color Doppler Indications:     Dyspnea 786.09  History:         Patient has no prior history of Echocardiogram examinations.                  CAD, COPD; Risk Factors:Hypertension and Diabetes. CKD.  Sonographer:     Charmayne Sheer RDCS (AE) Referring Phys:  [id number] Flora Lipps Diagnosing Phys: Ida Rogue MD  Sonographer Comments: Echo performed with patient supine and on artificial respirator. IMPRESSIONS  1. Left ventricular ejection fraction, by estimation, is 60 to 65%. The left ventricle has normal function. The left ventricle has no regional wall motion abnormalities. Left ventricular diastolic parameters are consistent with Grade I diastolic dysfunction (impaired relaxation).  2. Right ventricular systolic function is normal. The right ventricular size is normal. Tricuspid regurgitation signal is inadequate for assessing PA pressure. FINDINGS  Left Ventricle: Left ventricular ejection fraction, by estimation, is 60 to 65%. The left ventricle has normal function. The left ventricle has no regional wall motion abnormalities. The left ventricular internal cavity size was normal in size. There is  no  left ventricular hypertrophy. Left ventricular diastolic parameters are consistent with Grade I diastolic dysfunction (impaired relaxation). Right Ventricle: The right ventricular size is normal. No increase in right ventricular wall thickness. Right ventricular systolic function is normal. Tricuspid regurgitation signal is inadequate for assessing PA pressure. Left  Atrium: Left atrial size was normal in size. Right Atrium: Right atrial size was normal in size. Pericardium: There is no evidence of pericardial effusion. Mitral Valve: The mitral valve is normal in structure and function. Normal mobility of the mitral valve leaflets. No evidence of mitral valve regurgitation. No evidence of mitral valve stenosis. MV peak gradient, 4.5 mmHg. The mean mitral valve gradient is 2.0 mmHg. Tricuspid Valve: The tricuspid valve is normal in structure. Tricuspid valve regurgitation is trivial. No evidence of tricuspid stenosis. Aortic Valve: The aortic valve was not well visualized. Aortic valve regurgitation is not visualized. Mild to moderate aortic valve sclerosis/calcification is present, without any evidence of aortic stenosis. Aortic valve mean gradient measures 9.0 mmHg.  Aortic valve peak gradient measures 15.1 mmHg. Aortic valve area, by VTI measures 2.62 cm. Pulmonic Valve: The pulmonic valve was normal in structure. Pulmonic valve regurgitation is not visualized. No evidence of pulmonic stenosis. Aorta: The aortic root is normal in size and structure. Venous: The inferior vena cava is normal in size with greater than 50% respiratory variability, suggesting right atrial pressure of 3 mmHg. IAS/Shunts: No atrial level shunt detected by color flow Doppler.  LEFT VENTRICLE PLAX 2D LVIDd:         4.04 cm  Diastology LVIDs:         2.89 cm  LV e' lateral:   6.96 cm/s LV PW:         0.74 cm  LV E/e' lateral: 10.5 LV IVS:        0.75 cm  LV e' medial:    4.68 cm/s LVOT diam:     1.90 cm  LV E/e' medial:  15.6 LV SV:         69.46 ml LV SV Index:   19.73 LVOT Area:     2.84 cm  LEFT ATRIUM         Index LA diam:    2.00 cm 1.03 cm/m  AORTIC VALVE                    PULMONIC VALVE AV Area (Vmax):    2.10 cm     PV Vmax:       1.71 m/s AV Area (Vmean):   2.14 cm     PV Vmean:      114.000 cm/s AV Area (VTI):     2.62 cm     PV VTI:        0.285 m AV Vmax:           194.00 cm/s  PV  Peak grad:  11.7 mmHg AV Vmean:          142.000 cm/s PV Mean grad:  6.0 mmHg AV VTI:            0.265 m AV Peak Grad:      15.1 mmHg AV Mean Grad:      9.0 mmHg LVOT Vmax:         144.00 cm/s LVOT Vmean:        107.000 cm/s LVOT VTI:          0.245 m LVOT/AV VTI ratio: 0.92  AORTA Ao Root diam: 3.00 cm MITRAL VALVE MV  Area (PHT): 3.77 cm     SHUNTS MV Peak grad:  4.5 mmHg     Systemic VTI:  0.24 m MV Mean grad:  2.0 mmHg     Systemic Diam: 1.90 cm MV Vmax:       1.06 m/s MV Vmean:      73.0 cm/s MV Decel Time: 201 msec MV E velocity: 72.80 cm/s MV A velocity: 105.00 cm/s MV E/A ratio:  0.69 Ida Rogue MD Electronically signed by Ida Rogue MD Signature Date/Time: 09/07/2019/12:56:14 PM    Final     Medications:  I have reviewed the patient's current medications. Scheduled: . ascorbic acid  500 mg Oral BID  . atorvastatin  40 mg Oral QPM  . atropine  1 mg Intravenous Once  . B-complex with vitamin C  1 tablet Per Tube QHS  . calcium acetate  1,334 mg Oral TID WC  . calcium carbonate  1 tablet Oral Daily  . chlorhexidine gluconate (MEDLINE KIT)  15 mL Mouth Rinse BID  . Chlorhexidine Gluconate Cloth  6 each Topical Q0600  . cholecalciferol  1,000 Units Oral Daily  . cinacalcet  30 mg Oral Daily  . dextrose  1 ampule Intravenous Once  . docusate sodium  100 mg Oral BID  . feeding supplement (PRO-STAT SUGAR FREE 64)  60 mL Per Tube BID  . feeding supplement (VITAL 1.5 CAL)  1,000 mL Per Tube Q24H  . ferrous sulfate  325 mg Oral QPC breakfast  . gabapentin  600 mg Oral TID  . heparin injection (subcutaneous)  5,000 Units Subcutaneous Q8H  . mouth rinse  15 mL Mouth Rinse 10 times per day  . oxybutynin  5 mg Oral TID  . pantoprazole  40 mg Oral Daily    Assessment/Plan: 79 year old female with multiple medical problems including ESRD on hemodialysis who was admitted 2/16 for left total hip arthroplasty. 1 day post op developed severe respiratorydistress, severe metabolic acidosis  and encephalopathy requiring intubation.  Seizure activity noted and patient started on Vimpat.  Bradycardic overnight.  Remains poorly responsive.   Patient has had episodes in the past when she became unresponsive with illnesses and required extensive amount of time for recovery. No past history of seizures. EEG reveals some evidence of frontal cortical irritability. No evidence of electrographic seizure activity.  MRI of the brain personally reviewed and shows no acute changes.  Patient likely responding to stressors from surgery and metabolic acidosis. Seizure may have been provoked as well. If this is the case, as in the past, patient will likely require a prolonged period of time for recovery. Tolerating Vimpat. Magnesium normal, phosphorus elevated.  Recommendations: 1. Continue Vimpat at 148m q 12hours 2. Seizure precautions 3. Agree with addressing medical issues   LOS: 3 days   LAlexis Goodell MD Neurology [phone]/19/2021  10:26 AM

## 2019-09-08 NOTE — Progress Notes (Signed)
[facility]  ROUNDING NOTE   Subjective:  Patient still critically ill. Still on the ventilator. Currently on CRRT.  Objective:  Vital signs in last 24 hours:  Temp:  [97.8 F (36.6 C)-99.4 F (37.4 C)] 98.7 F (37.1 C) (02/19 1200) Pulse Rate:  [71-93] 89 (02/19 1400) Resp:  [10-20] 13 (02/19 1400) BP: (53-176)/(30-52) 101/41 (02/19 1345) SpO2:  [91 %-100 %] 99 % (02/19 1400) FiO2 (%):  [24 %-30 %] 24 % (02/19 1115) Weight:  [84.9 kg] 84.9 kg (02/18 1600)  Weight change: 0 kg Filed Weights   09/06/19 0747 09/07/19 1600  Weight: 84.9 kg 84.9 kg    Intake/Output: I/O last 3 completed shifts: In: 4716.1 [I.V.:3690.1; Other:40; NG/GT:821; IV Piggyback:165] Out: 2112 [Other:2112]   Intake/Output this shift:  Total I/O In: 1029.3 [I.V.:705.9; NG/GT:288.4; IV Piggyback:35] Out: 1096 [Other:1096]  Physical Exam: General: Critically ill-appearing  Head: ETT in place  Eyes: Anicteric  Neck: Supple, trachea midline  Lungs:  Scattered rhonchi, vent assisted  Heart: S1S2 no rubs  Abdomen:  Soft, nontender, bowel sounds present  Extremities: trace peripheral edema.  Neurologic: Intubated, sedated  Skin: No lesions  Access: Left upper extremity AV graft, femoral dialysis catheter    Basic Metabolic Panel: Recent Labs  Lab 09/05/19 0647 09/05/19 0647 09/06/19 0532 09/06/19 0532 09/06/19 0850 09/06/19 1752 09/07/19 0529 09/07/19 1105 09/07/19 1655 09/08/19 0547  NA 140  --  138  --   --   --  136  --  134* 135  134*  K 4.2   < > 3.8  --   --   --  2.9* 4.2 3.3* 3.7  3.7  CL 108  --  104  --   --   --  95*  --  91* 95*  94*  CO2  --   --  20*  --   --   --  25  --  23 29  28   GLUCOSE 93  --  119*  --   --   --  122*  --  133* 127*  128*  BUN 40*  --  49*  --   --   --  49*  --  58* 43*  47*  CREATININE 8.80*  --  10.14*  --   --   --  10.09*  --  10.66* 7.20*  7.16*  CALCIUM  --   --  7.6*   < >  --   --  7.4*  --  7.5* 7.7*  7.7*  MG  --    --   --   --   --  2.0 1.8 2.3  --  2.3  PHOS  --   --   --   --  8.8*  --  8.9*  --  9.1* 6.4*  6.5*   < > = values in this interval not displayed.    Liver Function Tests: Recent Labs  Lab 09/07/19 1655 09/08/19 0547  ALBUMIN 2.8* 2.4*   No results for input(s): LIPASE, AMYLASE in the last 168 hours. No results for input(s): AMMONIA in the last 168 hours.  CBC: Recent Labs  Lab 09/05/19 0647 09/05/19 1435 09/06/19 0532 09/07/19 0529  WBC  --  9.8 8.1 11.9*  HGB 15.0 11.6* 10.5* 10.9*  HCT 44.0 37.0 34.2* 34.5*  MCV  --  99.7 100.3* 100.0  PLT  --  146* 132* 120*    Cardiac Enzymes: No results for input(s): CKTOTAL, CKMB, CKMBINDEX, TROPONINI  in the last 168 hours.  BNP: Invalid input(s): POCBNP  CBG: Recent Labs  Lab 09/07/19 1958 09/08/19 0040 09/08/19 0440 09/08/19 0727 09/08/19 1136  GLUCAP 136* 101* 116* 101* 102*    Microbiology: Results for orders placed or performed during the hospital encounter of 09/05/19  MRSA PCR Screening     Status: Abnormal   Collection Time: 09/06/19  7:50 AM   Specimen: Nasal Mucosa; Nasopharyngeal  Result Value Ref Range Status   MRSA by PCR POSITIVE (A) NEGATIVE Final    Comment:        The GeneXpert MRSA Assay (FDA approved for NASAL specimens only), is one component of a comprehensive MRSA colonization surveillance program. It is not intended to diagnose MRSA infection nor to guide or monitor treatment for MRSA infections. RESULT CALLED TO, READ BACK BY AND VERIFIED WITH: MEGAN SHEFFIELD AT 0931 ON 09/06/2019 [place]. Performed at [facility], [address] [place], [place] [address]     Coagulation Studies: No results for input(s): LABPROT, INR in the last 72 hours.  Urinalysis: No results for input(s): COLORURINE, LABSPEC, PHURINE, GLUCOSEU, HGBUR, BILIRUBINUR, KETONESUR, PROTEINUR, UROBILINOGEN, NITRITE, LEUKOCYTESUR in the last 72 hours.  Invalid input(s): APPERANCEUR     Imaging: EEG  Result Date: 09/06/2019 Alexis Goodell, MD     09/06/2019  5:06 PM ELECTROENCEPHALOGRAM REPORT Patient: Barbara [name]       Room #: IC01A-AA EEG No. ID: [id number] Age: 79 y.o.        Sex: female Requesting Physician: Kasa Report Date:  09/06/2019       Interpreting Physician: Alexis Goodell History: Barbara [name] is an 79 y.o. female with new onset seizures and altered mental status Medications: Lipitor, Neurontin, Ditropan, Fentanyl, Neosynephrine Conditions of Recording:  This is a 21 channel routine scalp EEG performed with bipolar and monopolar montages arranged in accordance to the international 10/20 system of electrode placement. One channel was dedicated to EKG recording. The patient is in the intubated and sedated state. Description:  The background activity is slow and poorly organized.  It consists of a low voltage polymorphic delta delta activity this is diffusely distributed.  There are intermittent generalized periods of attenuation noted as well occasionally during the recording.  These periods of attenuation are short, lasting up to 2 seconds.  Also, rare generalized polyspike activity is noted with frontal predominance. Hyperventilation and intermittent photic stimulation were not performed. IMPRESSION: This is an abnormal electroencephalogram secondary to discontinuous general background slowing with rare, superimposed, generalized polyspike activity with frontal predominance.  This suggest an underlying diffuse cerebral disturbance and frontal, cortical irritability.  No electrographic seizures are noted.  Alexis Goodell, MD Neurology [phone] 09/06/2019, 4:56 PM   MR BRAIN WO CONTRAST  Result Date: 09/07/2019 CLINICAL DATA:  Ataxia. Unresponsive. Seizure like activity. Metabolic acidosis. EXAM: MRI HEAD WITHOUT CONTRAST TECHNIQUE: Multiplanar, multiecho pulse sequences of the brain and surrounding structures were obtained without intravenous contrast. COMPARISON:   05/14/2019 FINDINGS: Brain: Diffusion imaging does not show any acute or subacute infarction. No focal abnormality affects the brainstem. No focal cerebellar finding. Cerebral hemispheres show age related atrophy with chronic small-vessel ischemic changes of the white matter. No large vessel territory infarction. No mass lesion, hemorrhage, hydrocephalus or extra-axial collection. Vascular: Major vessels at the base of the brain show flow. Skull and upper cervical spine: Negative Sinuses/Orbits: Clear/normal Other: None IMPRESSION: No change since the study of last October. No acute or reversible finding. Age related atrophy. Chronic small-vessel ischemic changes.  Electronically Signed   By: Nelson Chimes M.D.   On: 09/07/2019 01:48   ECHOCARDIOGRAM COMPLETE  Result Date: 09/07/2019    ECHOCARDIOGRAM REPORT   Patient Name:   Barbara [name] Date of Exam: 09/07/2019 Medical Rec #:  [id number]     Height:       66.0 in Accession #:    [id number]    Weight:       187.2 lb Date of Birth:  [birth year]      BSA:          1.94 m Patient Age:    79 years      BP:           140/38 mmHg Patient Gender: F             HR:           90 bpm. Exam Location:  [facility] Procedure: 2D Echo, Cardiac Doppler and Color Doppler Indications:     Dyspnea 786.09  History:         Patient has no prior history of Echocardiogram examinations.                  CAD, COPD; Risk Factors:Hypertension and Diabetes. CKD.  Sonographer:     Charmayne Sheer RDCS (AE) Referring Phys:  [id number] Flora Lipps Diagnosing Phys: Ida Rogue MD  Sonographer Comments: Echo performed with patient supine and on artificial respirator. IMPRESSIONS  1. Left ventricular ejection fraction, by estimation, is 60 to 65%. The left ventricle has normal function. The left ventricle has no regional wall motion abnormalities. Left ventricular diastolic parameters are consistent with Grade I diastolic dysfunction (impaired relaxation).  2. Right ventricular systolic function is normal. The  right ventricular size is normal. Tricuspid regurgitation signal is inadequate for assessing PA pressure. FINDINGS  Left Ventricle: Left ventricular ejection fraction, by estimation, is 60 to 65%. The left ventricle has normal function. The left ventricle has no regional wall motion abnormalities. The left ventricular internal cavity size was normal in size. There is  no left ventricular hypertrophy. Left ventricular diastolic parameters are consistent with Grade I diastolic dysfunction (impaired relaxation). Right Ventricle: The right ventricular size is normal. No increase in right ventricular wall thickness. Right ventricular systolic function is normal. Tricuspid regurgitation signal is inadequate for assessing PA pressure. Left Atrium: Left atrial size was normal in size. Right Atrium: Right atrial size was normal in size. Pericardium: There is no evidence of pericardial effusion. Mitral Valve: The mitral valve is normal in structure and function. Normal mobility of the mitral valve leaflets. No evidence of mitral valve regurgitation. No evidence of mitral valve stenosis. MV peak gradient, 4.5 mmHg. The mean mitral valve gradient is 2.0 mmHg. Tricuspid Valve: The tricuspid valve is normal in structure. Tricuspid valve regurgitation is trivial. No evidence of tricuspid stenosis. Aortic Valve: The aortic valve was not well visualized. Aortic valve regurgitation is not visualized. Mild to moderate aortic valve sclerosis/calcification is present, without any evidence of aortic stenosis. Aortic valve mean gradient measures 9.0 mmHg.  Aortic valve peak gradient measures 15.1 mmHg. Aortic valve area, by VTI measures 2.62 cm. Pulmonic Valve: The pulmonic valve was normal in structure. Pulmonic valve regurgitation is not visualized. No evidence of pulmonic stenosis. Aorta: The aortic root is normal in size and structure. Venous: The inferior vena cava is normal in size with greater than 50% respiratory variability,  suggesting right atrial pressure of 3 mmHg. IAS/Shunts: No atrial level shunt detected by color  flow Doppler.  LEFT VENTRICLE PLAX 2D LVIDd:         4.04 cm  Diastology LVIDs:         2.89 cm  LV e' lateral:   6.96 cm/s LV PW:         0.74 cm  LV E/e' lateral: 10.5 LV IVS:        0.75 cm  LV e' medial:    4.68 cm/s LVOT diam:     1.90 cm  LV E/e' medial:  15.6 LV SV:         69.46 ml LV SV Index:   19.73 LVOT Area:     2.84 cm  LEFT ATRIUM         Index LA diam:    2.00 cm 1.03 cm/m  AORTIC VALVE                    PULMONIC VALVE AV Area (Vmax):    2.10 cm     PV Vmax:       1.71 m/s AV Area (Vmean):   2.14 cm     PV Vmean:      114.000 cm/s AV Area (VTI):     2.62 cm     PV VTI:        0.285 m AV Vmax:           194.00 cm/s  PV Peak grad:  11.7 mmHg AV Vmean:          142.000 cm/s PV Mean grad:  6.0 mmHg AV VTI:            0.265 m AV Peak Grad:      15.1 mmHg AV Mean Grad:      9.0 mmHg LVOT Vmax:         144.00 cm/s LVOT Vmean:        107.000 cm/s LVOT VTI:          0.245 m LVOT/AV VTI ratio: 0.92  AORTA Ao Root diam: 3.00 cm MITRAL VALVE MV Area (PHT): 3.77 cm     SHUNTS MV Peak grad:  4.5 mmHg     Systemic VTI:  0.24 m MV Mean grad:  2.0 mmHg     Systemic Diam: 1.90 cm MV Vmax:       1.06 m/s MV Vmean:      73.0 cm/s MV Decel Time: 201 msec MV E velocity: 72.80 cm/s MV A velocity: 105.00 cm/s MV E/A ratio:  0.69 Ida Rogue MD Electronically signed by Ida Rogue MD Signature Date/Time: 09/07/2019/12:56:14 PM    Final      Medications:   .  prismasol BGK 4/2.5 500 mL/hr at 09/07/19 1657  .  prismasol BGK 4/2.5 500 mL/hr at 09/07/19 1657  . sodium chloride Stopped (09/06/19 0721)  . sodium chloride    . sodium chloride    . sodium chloride    . DOPamine 3 mcg/kg/min (09/08/19 0700)  . fentaNYL infusion INTRAVENOUS Stopped (09/08/19 1012)  . lacosamide (VIMPAT) IV 100 mg (09/08/19 1017)  . phenylephrine (NEO-SYNEPHRINE) Adult infusion 85 mcg/min (09/08/19 1307)  . prismasol BGK 4/2.5  1,500 mL/hr at 09/08/19 1234   . ascorbic acid  500 mg Oral BID  . atorvastatin  40 mg Oral QPM  . atropine  1 mg Intravenous Once  . B-complex with vitamin C  1 tablet Per Tube QHS  . calcium acetate  1,334 mg Oral TID WC  . calcium carbonate  1 tablet  Oral Daily  . Chlorhexidine Gluconate Cloth  6 each Topical Q0600  . cholecalciferol  1,000 Units Oral Daily  . cinacalcet  30 mg Oral Daily  . dextrose  1 ampule Intravenous Once  . docusate sodium  100 mg Oral BID  . feeding supplement (PRO-STAT SUGAR FREE 64)  60 mL Per Tube BID  . feeding supplement (VITAL 1.5 CAL)  1,000 mL Per Tube Q24H  . ferrous sulfate  325 mg Oral QPC breakfast  . gabapentin  600 mg Oral TID  . heparin injection (subcutaneous)  5,000 Units Subcutaneous Q8H  . mouth rinse  15 mL Mouth Rinse BID  . oxybutynin  5 mg Oral TID  . pantoprazole  40 mg Oral Daily   sodium chloride, sodium chloride, acetaminophen, albuterol, alteplase, bisacodyl, diphenhydrAMINE, heparin, heparin, hydrALAZINE, HYDROcodone-acetaminophen, lidocaine (PF), lidocaine-prilocaine, loperamide HCl, magnesium hydroxide, metoCLOPramide **OR** metoCLOPramide (REGLAN) injection, midazolam, morphine injection, ondansetron **OR** ondansetron (ZOFRAN) IV, pentafluoroprop-tetrafluoroeth, sodium phosphate, traMADol  Assessment/ Plan:  79 y.o. female with end stage renal disease on hemodialysis, hypertension, diabetes mellitus type II, diabetic neuropathy who was admitted for left hip repair and unfortunate developed seizure episode postoperatively and subsequently intubated.  [facility] MWF-1/[facility]/LUE AVG  1.  ESRD on HD MWF.  Patient still requiring pressors to help maintain blood pressure.  Therefore we will maintain the patient on CRRT with net even goal for now.  2.  Acute respiratory failure.  Developed seizure disorder and it was felt that she could not protect her airway.   -Patient remains on mechanical ventilation.  Weaning as  per pulmonary/critical care.  3.  Anemia of chronic kidney disease.  Hemoglobin currently 10.9.  Hold off on Epogen for now.  4.  Secondary hyperparathyroidism.  Phosphorus now down to 6.5.  Continue to periodically monitor.     LOS: 3 Xian Alves 2/19/20212:47 PM

## 2019-09-08 NOTE — Progress Notes (Signed)
[facility] for Electrolyte Monitoring and Replacement   Recent Labs: Potassium (mmol/L)  Date Value  09/08/2019 3.7  09/08/2019 3.7  10/30/2014 3.2 (L)   Magnesium (mg/dL)  Date Value  09/08/2019 2.3  09/20/2011 1.9   Calcium (mg/dL)  Date Value  09/08/2019 7.7 (L)  09/08/2019 7.7 (L)   Calcium, Total (mg/dL)  Date Value  08/07/2012 9.1   Albumin (g/dL)  Date Value  09/08/2019 2.4 (L)  08/02/2012 3.9   Phosphorus (mg/dL)  Date Value  09/08/2019 6.5 (H)  09/08/2019 6.4 (H)   Sodium (mmol/L)  Date Value  09/08/2019 134 (L)  09/08/2019 135  11/04/2016 138  08/07/2012 138     Assessment: 79 year old female s/p left total hip arthroplasty. Patient admitted to ICU post rapid response where patient was found to be unresponsive and required bipap. Noted to have seizure-like activity while receiving dialysis and was subsequently intubated for inability to protect airway. Pt currently on tube feed. Pharmacy consulted to manage electrolytes.  Goal of Therapy:  Electrolytes WNL  Plan:  K and Mg are WNL. Na borderline low. Pt did not tolerate HD and is now on CRRT. No additional repletion is needed at this time.  Will continue to follow and manage electrolytes.  Faylene Million , Pharmacy Student 09/08/2019 9:39 AM

## 2019-09-08 NOTE — Progress Notes (Signed)
Pt extubated per MD order, pt placed on 2L [place]. No resp distress noted at this time. Will continue to monitor

## 2019-09-08 NOTE — Progress Notes (Signed)
CRITICAL CARE NOTE 79 yo AAF admitted to ICU for severe resp failure S/p LEFT hip replacement for OA and PAIN Patient with ESRD on HD  Transferred to ICU for severe resp distress, severe metabolic acidosis and encephalopathy  PH 7.16 Placed on biPAP +ABD distention   Significant events 2/17 transferred to ICU for increased work of breathing respiratory failure 2/17 emergently intubated, MRI of the brain did not show acute stroke Patient with severe metabolic acidosis, presumed HD 2/17 vascular surgery placed triple lumen dialysis catheter right groin 2/17 family at bedside updated and notified of clinical status. Patient was made DNR 2/19 tolerating spontaneous breathing trial, plan extubation  CC  follow up respiratory failure  SUBJECTIVE She opens her eyes and regards examiner but does not follow commands Psychomotor retardation noted Appears to be able to protect her airway with strong cough MRI of the brain negative for acute strokes Troponins mildly elevated Tolerating SBT off of all sedatives  Vent Mode: PSV FiO2 (%):  [24 %-30 %] 24 % Set Rate:  [15 bmp] 15 bmp Vt Set:  [450 mL] 450 mL PEEP:  [5 cmH20] 5 cmH20 Pressure Support:  [5 cmH20] 5 cmH20 Plateau Pressure:  [14 cmH20-17 cmH20] 17 cmH20  CBC    Component Value Date/Time   WBC 11.9 (H) 09/07/2019 0529   RBC 3.45 (L) 09/07/2019 0529   HGB 10.9 (L) 09/07/2019 0529   HGB 10.2 (L) 08/07/2012 0433   HCT 34.5 (L) 09/07/2019 0529   HCT 30.7 (L) 08/07/2012 0433   PLT 120 (L) 09/07/2019 0529   PLT 294 08/07/2012 0433   MCV 100.0 09/07/2019 0529   MCV 85 08/07/2012 0433   MCH 31.6 09/07/2019 0529   MCHC 31.6 09/07/2019 0529   RDW 14.6 09/07/2019 0529   RDW 13.7 08/07/2012 0433   LYMPHSABS 1.4 07/26/2019 1341   LYMPHSABS 1.6 08/07/2012 0433   MONOABS 0.4 07/26/2019 1341   MONOABS 0.4 08/07/2012 0433   EOSABS 0.1 07/26/2019 1341   EOSABS 0.0 08/07/2012 0433   BASOSABS 0.0 07/26/2019 1341   BASOSABS 0  11/22/2013 1935   BMP Latest Ref Rng & Units 09/08/2019 09/08/2019 09/08/2019  Glucose 70 - 99 mg/dL 137(H) 127(H) 128(H)  BUN 8 - 23 mg/dL 30(H) 43(H) 47(H)  Creatinine 0.44 - 1.00 mg/dL 4.31(H) 7.20(H) 7.16(H)  BUN/Creat Ratio 12 - 28 - - -  Sodium 135 - 145 mmol/L 135 135 134(L)  Potassium 3.5 - 5.1 mmol/L 4.0 3.7 3.7  Chloride 98 - 111 mmol/L 96(L) 95(L) 94(L)  CO2 22 - 32 mmol/L 30 29 28   Calcium 8.9 - 10.3 mg/dL 7.6(L) 7.7(L) 7.7(L)      BP (!) 147/48   Pulse 90   Temp 98.7 F (37.1 C) (Oral)   Resp (!) 9   Ht 5' 6"  (1.676 m)   Wt 84.9 kg   SpO2 100%   BMI 30.21 kg/m    I/O last 3 completed shifts: In: 4716.1 [I.V.:3690.1; Other:40; NG/GT:821; IV Piggyback:165] Out: 2112 [Other:2112] Total I/O In: 1144.4 [I.V.:821; NG/GT:288.4; IV Piggyback:35] Out: 1315 [Other:1315]  SpO2: 100 % O2 Flow Rate (L/min): 2 L/min FiO2 (%): 24 %     REVIEW OF SYSTEMS:  Unable to obtain due to patient's mechanically ventilated status, poor response overall.  PHYSICAL EXAMINATION:  GENERAL: Chronically ill-appearing, elderly woman orotracheally intubated, mechanically ventilated HEAD: Normocephalic, atraumatic.  EYES: Pupils equal, round, reactive to light.  No scleral icterus.  MOUTH: Orotracheally intubated, OG in place.  Oral mucosa moist.  Poor dentition. NECK: Supple.  Trachea midline, no JVD, no crepitus. PULMONARY: Coarse breath sounds, no other adventitious sounds. CARDIOVASCULAR: S1 and S2. Regular rate and rhythm.  No rubs or gallops, grade 1/6 to 2/6 systolic ejection murmur at the right sternal border. GASTROINTESTINAL: Soft, non-distended.  Positive bowel sounds.  Tolerating tube feeds. MUSCULOSKELETAL: No joint deformity, no clubbing, no edema.  NEUROLOGIC: Opens eyes, regards examiner, does not follow commands, strong cough.  Moves all fours spontaneously.  Psychomotor retardation noted. SKIN:intact,warm,dry.  Limited exam shows no rashes.  MEDICATIONS: I have  reviewed all medications and confirmed regimen as documented   CULTURE RESULTS   Recent Results (from the past 240 hour(s))  SARS CORONAVIRUS 2 (TAT 6-24 HRS) Nasopharyngeal Nasopharyngeal Swab     Status: None   Collection Time: 09/01/19 11:59 AM   Specimen: Nasopharyngeal Swab  Result Value Ref Range Status   SARS Coronavirus 2 NEGATIVE NEGATIVE Final    Comment: (NOTE) SARS-CoV-2 target nucleic acids are NOT DETECTED. The SARS-CoV-2 RNA is generally detectable in upper and lower respiratory specimens during the acute phase of infection. Negative results do not preclude SARS-CoV-2 infection, do not rule out co-infections with other pathogens, and should not be used as the sole basis for treatment or other patient management decisions. Negative results must be combined with clinical observations, patient history, and epidemiological information. The expected result is Negative. Fact Sheet for Patients: SugarRoll.be Fact Sheet for Healthcare Providers: https://www.woods-mathews.com/ This test is not yet approved or cleared by the [place] FDA and  has been authorized for detection and/or diagnosis of SARS-CoV-2 by FDA under an Emergency Use Authorization (EUA). This EUA will remain  in effect (meaning this test can be used) for the duration of the COVID-19 declaration under Section 56 4(b)(1) of the Act, 21 U.S.C. section 360bbb-3(b)(1), unless the authorization is terminated or revoked sooner. Performed at [facility], [place] [address], [place], [place] [address]   Surgical pcr screen     Status: Abnormal   Collection Time: 09/01/19 11:59 AM   Specimen: Nasal Mucosa; Nasal Swab  Result Value Ref Range Status   MRSA, PCR POSITIVE (A) NEGATIVE Final    Comment: RESULT CALLED TO, READ BACK BY AND VERIFIED WITH: HEATHER SILVER @1545  09/01/19 MJU    Staphylococcus aureus POSITIVE (A) NEGATIVE Final    Comment: (NOTE) The Xpert  SA Assay (FDA approved for NASAL specimens in patients 83 years of age and older), is one component of a comprehensive surveillance program. It is not intended to diagnose infection nor to guide or monitor treatment. Performed at [facility], [address] [place], [place] [address]   MRSA PCR Screening     Status: Abnormal   Collection Time: 09/06/19  7:50 AM   Specimen: Nasal Mucosa; Nasopharyngeal  Result Value Ref Range Status   MRSA by PCR POSITIVE (A) NEGATIVE Final    Comment:        The GeneXpert MRSA Assay (FDA approved for NASAL specimens only), is one component of a comprehensive MRSA colonization surveillance program. It is not intended to diagnose MRSA infection nor to guide or monitor treatment for MRSA infections. RESULT CALLED TO, READ BACK BY AND VERIFIED WITH: MEGAN SHEFFIELD AT 0931 ON 09/06/2019 [place]. Performed at [facility], [address], [place], [place] [address]           IMAGING    No results found.     Indwelling Urinary Catheter replaced today, requirement due to  Reason to continue Indwelling Urinary Catheter strict Intake/Output monitoring for hemodynamic instability, urinary incontinence   Central Line/ continued, requirement due to  Reason to continue [place] of central venous pressure or other hemodynamic parameters and poor IV access, CRRT access.   Ventilator continued, requirement due to severe respiratory failure, being assessed for extubation   Ventilator Sedation RASS 0       ASSESSMENT AND PLAN:  Acute hypoxic and Hypercapnic Respiratory Failure Ventilator dependent Initially intubated for airway protection and management of hypercarbia due to encephalopathy/obtundation She shows persistent psychomotor retardation Respiratory status however, good, tolerating SBT PS/CPAP 5/5 x 4 hours without tiring Proceed with extubation today As needed BiPAP    Diastolic dysfunction of the  left ventricle Aortic valve sclerosis without overt stenosis Monitor for volume overload On CRRT   ESRD on HD MWF Did not tolerate HD and is currently on CRRT Reassess likelihood of rechallenging with HD Hopefully extubation will help hemodynamics during HD Nephrology following, appreciate input Monitor renal panel  Toxic metabolic encephalopathy Seizures MRI did not show acute event/process Continue Vimpat Neurology following, appreciate input Appears patient has had similar episodes previously, slow to recover  GI Presumed ileus resolved Protonix for GI prophylaxis  NUTRITION Has been on tube feeds Swallow evaluation after extubation Constipation protocol as indicated  DVT prophylaxis On heparin for CRRT  MRSA colonization of nares Bactroban protocol  Multidisciplinary rounds were performed with ICU team  Critical Care Time devoted to patient care services described in this note is 45 minutes.   Overall, patient is critically ill, prognosis is guarded.  Patient is DNR.   Renold Don, MD [facility]    *This note was dictated using voice recognition software/Dragon.  Despite best efforts to proofread, errors can occur which can change the meaning.  Any change was purely unintentional.

## 2019-09-08 NOTE — Progress Notes (Signed)
Pharmacy Monitoring Note - CRRT  Patient started on CRRT 2/18. Pharmacist reviewed current medications, no dosage adjustments indicated at this time. Will continue to monitor for necessary adjustments.  Dorena Bodo, PharmD

## 2019-09-09 ENCOUNTER — Inpatient Hospital Stay: Payer: Medicare Other

## 2019-09-09 LAB — CBC WITH DIFFERENTIAL/PLATELET
Abs Immature Granulocytes: 0.4 10*3/uL — ABNORMAL HIGH (ref 0.00–0.07)
Basophils Absolute: 0 10*3/uL (ref 0.0–0.1)
Basophils Relative: 0 %
Eosinophils Absolute: 0 10*3/uL (ref 0.0–0.5)
Eosinophils Relative: 0 %
HCT: 35 % — ABNORMAL LOW (ref 36.0–46.0)
Hemoglobin: 10.9 g/dL — ABNORMAL LOW (ref 12.0–15.0)
Immature Granulocytes: 2 %
Lymphocytes Relative: 3 %
Lymphs Abs: 0.6 10*3/uL — ABNORMAL LOW (ref 0.7–4.0)
MCH: 31.4 pg (ref 26.0–34.0)
MCHC: 31.1 g/dL (ref 30.0–36.0)
MCV: 100.9 fL — ABNORMAL HIGH (ref 80.0–100.0)
Monocytes Absolute: 0.4 10*3/uL (ref 0.1–1.0)
Monocytes Relative: 2 %
Neutro Abs: 17 10*3/uL — ABNORMAL HIGH (ref 1.7–7.7)
Neutrophils Relative %: 93 %
Platelets: 120 10*3/uL — ABNORMAL LOW (ref 150–400)
RBC: 3.47 MIL/uL — ABNORMAL LOW (ref 3.87–5.11)
RDW: 14.2 % (ref 11.5–15.5)
WBC: 18.3 10*3/uL — ABNORMAL HIGH (ref 4.0–10.5)
nRBC: 0 % (ref 0.0–0.2)

## 2019-09-09 LAB — RENAL FUNCTION PANEL
Albumin: 2.4 g/dL — ABNORMAL LOW (ref 3.5–5.0)
Anion gap: 11 (ref 5–15)
BUN: 24 mg/dL — ABNORMAL HIGH (ref 8–23)
CO2: 25 mmol/L (ref 22–32)
Calcium: 8.2 mg/dL — ABNORMAL LOW (ref 8.9–10.3)
Chloride: 99 mmol/L (ref 98–111)
Creatinine, Ser: 3.45 mg/dL — ABNORMAL HIGH (ref 0.44–1.00)
GFR calc Af Amer: 14 mL/min — ABNORMAL LOW (ref >60)
GFR calc non Af Amer: 12 mL/min — ABNORMAL LOW (ref >60)
Glucose, Bld: 115 mg/dL — ABNORMAL HIGH (ref 70–99)
Phosphorus: 3.6 mg/dL (ref 2.5–4.6)
Potassium: 4.9 mmol/L (ref 3.5–5.1)
Sodium: 135 mmol/L (ref 135–145)

## 2019-09-09 LAB — GLUCOSE, CAPILLARY
Glucose-Capillary: 106 mg/dL — ABNORMAL HIGH (ref 70–99)
Glucose-Capillary: 107 mg/dL — ABNORMAL HIGH (ref 70–99)
Glucose-Capillary: 114 mg/dL — ABNORMAL HIGH (ref 70–99)
Glucose-Capillary: 117 mg/dL — ABNORMAL HIGH (ref 70–99)
Glucose-Capillary: 122 mg/dL — ABNORMAL HIGH (ref 70–99)
Glucose-Capillary: 126 mg/dL — ABNORMAL HIGH (ref 70–99)
Glucose-Capillary: 92 mg/dL (ref 70–99)
Glucose-Capillary: 97 mg/dL (ref 70–99)

## 2019-09-09 LAB — MAGNESIUM: Magnesium: 2.5 mg/dL — ABNORMAL HIGH (ref 1.7–2.4)

## 2019-09-09 MED ORDER — PANTOPRAZOLE SODIUM 40 MG PO PACK
40.0000 mg | PACK | Freq: Every day | ORAL | Status: DC
  Administered 2019-09-09 – 2019-09-12 (×3): 40 mg
  Filled 2019-09-09 (×5): qty 20

## 2019-09-09 MED ORDER — SODIUM CHLORIDE 0.9 % IV SOLN
3.0000 g | INTRAVENOUS | Status: DC
  Administered 2019-09-09 – 2019-09-11 (×2): 3 g via INTRAVENOUS
  Filled 2019-09-09 (×4): qty 8

## 2019-09-09 MED ORDER — DOCUSATE SODIUM 50 MG/5ML PO LIQD
100.0000 mg | Freq: Every day | ORAL | Status: DC
  Administered 2019-09-09 – 2019-09-12 (×4): 100 mg via ORAL
  Filled 2019-09-09 (×5): qty 10

## 2019-09-09 MED ORDER — MIDODRINE HCL 5 MG PO TABS
5.0000 mg | ORAL_TABLET | Freq: Three times a day (TID) | ORAL | Status: DC
  Administered 2019-09-09 – 2019-09-10 (×4): 5 mg
  Filled 2019-09-09 (×4): qty 1

## 2019-09-09 NOTE — Consult Note (Signed)
Pharmacy Antibiotic Note  Barbara [name] is a 79 y.o. female admitted on 09/05/2019 with aspiration pneumonia. Patient in hospital for left total hip arthroplasty and subsequently transferred to ICU on 2/55 for metabolic acidosis, encephalopathy, and severe respiratory distress requiring intubation. She was extubated 2/19. She has a history of ESRD on HD. Pharmacy has been consulted for ampicillin/sulbactam dosing for aspiration pneumonia.  Patient is afebrile. WBC elevated to 18.3. She is on high dose IV steroids.   Plan: Ampicillin/sulbactam 3 g q24h scheduled in the evening as to be given after dialysis on dialysis days  Height: 5' 6"  (167.6 cm) Weight: 184 lb 8.4 oz (83.7 kg) IBW/kg (Calculated) : 59.3  Temp (24hrs), Avg:98.4 F (36.9 C), Min:97.1 F (36.2 C), Max:98.9 F (37.2 C)  Recent Labs  Lab 09/05/19 0647 09/05/19 1435 09/06/19 0532 09/06/19 0532 09/07/19 0529 09/07/19 1655 09/08/19 0547 09/08/19 1601 09/09/19 0433  WBC  --  9.8 8.1  --  11.9*  --   --   --  18.3*  CREATININE   < >  --  10.14*   < > 10.09* 10.66* 7.20*  7.16* 4.31* 3.45*   < > = values in this interval not displayed.    Estimated Creatinine Clearance: 14.4 mL/min (A) (by C-G formula based on SCr of 3.45 mg/dL (H)).    Allergies  Allergen Reactions  . Glipizide Er Other (See Comments)    Hypoglycemia   . Percocet [Oxycodone-Acetaminophen] Hives    Antimicrobials this admission: Cefazolin for surgical prophylaxis Ampicillin/sulbactam 2/20 >>   Dose adjustments this admission: n/a  Microbiology results: 2/17 MRSA PCR: Positive  Thank you for allowing pharmacy to be a part of this patient's care.  [place] Resident 09/09/2019 6:53 PM

## 2019-09-09 NOTE — Progress Notes (Signed)
  Subjective: 4 Days Post-Op Procedure(s) (LRB): TOTAL HIP ARTHROPLASTY (Left) Patient extubated yesterday. Awake but not alert. No signs of pain.  Objective: Vital signs in last 24 hours: Temp:  [98.2 F (36.8 C)-98.9 F (37.2 C)] 98.8 F (37.1 C) (02/20 0700) Pulse Rate:  [85-102] 101 (02/20 1000) Resp:  [8-18] 14 (02/20 1000) BP: (63-154)/(30-78) 109/48 (02/20 1000) SpO2:  [95 %-100 %] 95 % (02/20 1000) Weight:  [83.7 kg] 83.7 kg (02/20 0500)  Intake/Output from previous day:  Intake/Output Summary (Last 24 hours) at 09/09/2019 1128 Last data filed at 09/09/2019 1000 Gross per 24 hour  Intake 760.61 ml  Output 1048 ml  Net -287.39 ml    Intake/Output this shift: Total I/O In: 6.9 [I.V.:6.9] Out: 30 [Other:30]  Labs: Recent Labs    09/07/19 0529 09/09/19 0433  HGB 10.9* 10.9*   Recent Labs    09/07/19 0529 09/09/19 0433  WBC 11.9* 18.3*  RBC 3.45* 3.47*  HCT 34.5* 35.0*  PLT 120* 120*   Recent Labs    09/08/19 1601 09/09/19 0433  NA 135 135  K 4.0 4.9  CL 96* 99  CO2 30 25  BUN 30* 24*  CREATININE 4.31* 3.45*  GLUCOSE 137* 115*  CALCIUM 7.6* 8.2*   No results for input(s): LABPT, INR in the last 72 hours.   EXAM General - Patient is awake but not alert Dressing intact to the left hip, no drainage. Patient is moving toes and feet this AM.  Past Medical History:  Diagnosis Date  . Adult celiac disease   . Anemia associated with chronic renal failure 2017   blood transfusion last week 10/17  . Arthritis   . Asthma   . 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 Chronic kidney disease   . 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  dr. Candiss Norse and dr. Holley Raring follow her  . COPD (chronic obstructive pulmonary disease) ([place])   . Coronary artery disease   . Diabetes mellitus without complication ([place])   . Dialysis patient ([place])    Tues, Thurs, Sat  . Dyspnea    with exertion  . Elevated lipids   . GERD (gastroesophageal reflux disease)    . Hematuria   . Hypertension   . Lower back pain   . Neuropathy   . Oxygen dependent    at hs  . Personal history of chemotherapy 2017   bladder ca  . Personal history of radiation therapy 2017   bladder ca  . Sleep apnea    uses cpap  . Urinary obstruction 01/2016    Assessment/Plan: 4 Days Post-Op Procedure(s) (LRB): TOTAL HIP ARTHROPLASTY (Left) Principal Problem:   Status post total hip replacement, left Active Problems:   Anemia in chronic kidney disease   COPD (chronic obstructive pulmonary disease) ([facility])   CAD in native artery   Benign essential HTN   Acid reflux   Type II diabetes mellitus with renal manifestations ([place])   ESRD on dialysis ([place])  Estimated body mass index is 29.78 kg/m as calculated from the following:   Height as of this encounter: 5' 6"  (1.676 m).   Weight as of this encounter: 83.7 kg.  Dressing intact to the left hip. Extubated yesterday. Continue to monitor progress. Begin PT when medical condition improves.  Raquel Lestine Rahe, PA-C [facility] Orthopaedic Surgery 09/09/2019, 11:28 AM

## 2019-09-09 NOTE — Progress Notes (Signed)
Subjective: Patient extubated.    Objective: Current vital signs: BP (!) 107/43   Pulse 99   Temp 98.8 F (37.1 C) (Axillary)   Resp 16   Ht 5' 6"  (1.676 m)   Wt 83.7 kg   SpO2 99%   BMI 29.78 kg/m  Vital signs in last 24 hours: Temp:  [98.2 F (36.8 C)-98.9 F (37.2 C)] 98.8 F (37.1 C) (02/20 0700) Pulse Rate:  [83-102] 99 (02/20 0900) Resp:  [8-18] 16 (02/20 0900) BP: (63-154)/(30-78) 107/43 (02/20 0900) SpO2:  [94 %-100 %] 99 % (02/20 0900) FiO2 (%):  [24 %] 24 % (02/19 1115) Weight:  [83.7 kg] 83.7 kg (02/20 0500)  Intake/Output from previous day: 02/19 0701 - 02/20 0700 In: 1385.7 [I.V.:1027.3; NG/GT:288.4; IV Piggyback:70] Out: 0263  Intake/Output this shift: Total I/O In: 6.9 [I.V.:6.9] Out: 20 [Other:20] Nutritional status:  Diet Order            Diet NPO time specified Except for: Other (See Comments)  Diet effective now              Neurologic Exam: Mental Status: Eyes open.  No speech.  Does not follow commands.  Withdraws to painful stimuli Cranial Nerves: II: Pupils right91m, left 351mand poorly reactivebilaterally III,IV,VI: Oculocephalic response present bilaterally.  V,VII: corneal reflexpresentbilaterally VIII: patient does not respond to verbal stimuli IX,X: gag reflexunable to test, XI: trapezius strength unable to test bilaterally XII: tongue strength unable to test Motor: Moves extremities spontaneously Sensory: Responds to noxious stimuli in all extremities   Lab Results: Basic Metabolic Panel: Recent Labs  Lab 09/06/19 0532 09/06/19 1752 09/07/19 0529 09/07/19 0529 09/07/19 1105 09/07/19 1655 09/07/19 1655 09/08/19 0547 09/08/19 1601 09/09/19 0433  NA   < >  --  136  --   --  134*  --  135  134* 135 135  K   < >  --  2.9*   < > 4.2 3.3*  --  3.7  3.7 4.0 4.9  CL   < >  --  95*  --   --  91*  --  95*  94* 96* 99  CO2   < >  --  25  --   --  23  --  29  28 30 25   GLUCOSE   < >  --  122*  --   --  133*   --  127*  128* 137* 115*  BUN   < >  --  49*  --   --  58*  --  43*  47* 30* 24*  CREATININE   < >  --  10.09*  --   --  10.66*  --  7.20*  7.16* 4.31* 3.45*  CALCIUM   < >  --  7.4*   < >  --  7.5*   < > 7.7*  7.7* 7.6* 8.2*  MG  --  2.0 1.8  --  2.3  --   --  2.3  --  2.5*  PHOS   < >  --  8.9*  --   --  9.1*  --  6.4*  6.5* 3.6 3.6   < > = values in this interval not displayed.    Liver Function Tests: Recent Labs  Lab 09/07/19 1655 09/08/19 0547 09/08/19 1601 09/09/19 0433  ALBUMIN 2.8* 2.4* 2.5* 2.4*   No results for input(s): LIPASE, AMYLASE in the last 168 hours. No results for input(s): AMMONIA in the last 168  hours.  CBC: Recent Labs  Lab 09/05/19 0647 09/05/19 1435 09/06/19 0532 09/07/19 0529 09/09/19 0433  WBC  --  9.8 8.1 11.9* 18.3*  NEUTROABS  --   --   --   --  17.0*  HGB 15.0 11.6* 10.5* 10.9* 10.9*  HCT 44.0 37.0 34.2* 34.5* 35.0*  MCV  --  99.7 100.3* 100.0 100.9*  PLT  --  146* 132* 120* 120*    Cardiac Enzymes: No results for input(s): CKTOTAL, CKMB, CKMBINDEX, TROPONINI in the last 168 hours.  Lipid Panel: No results for input(s): CHOL, TRIG, HDL, CHOLHDL, VLDL, LDLCALC in the last 168 hours.  CBG: Recent Labs  Lab 09/08/19 1917 09/09/19 0007 09/09/19 0328 09/09/19 0414 09/09/19 0705  GLUCAP 94 97 114* 126* 107*    Microbiology: Results for orders placed or performed during the hospital encounter of 09/05/19  MRSA PCR Screening     Status: Abnormal   Collection Time: 09/06/19  7:50 AM   Specimen: Nasal Mucosa; Nasopharyngeal  Result Value Ref Range Status   MRSA by PCR POSITIVE (A) NEGATIVE Final    Comment:        The GeneXpert MRSA Assay (FDA approved for NASAL specimens only), is one component of a comprehensive MRSA colonization surveillance program. It is not intended to diagnose MRSA infection nor to guide or monitor treatment for MRSA infections. RESULT CALLED TO, READ BACK BY AND VERIFIED WITH: MEGAN SHEFFIELD  AT 0931 ON 09/06/2019 [place]. Performed at [facility], [address] [place], [place] [address]     Coagulation Studies: No results for input(s): LABPROT, INR in the last 72 hours.  Imaging: DG Abd 1 View  Result Date: 09/09/2019 CLINICAL DATA:  NG tube placement. EXAM: ABDOMEN - 1 VIEW COMPARISON:  09/06/2019 FINDINGS: Nasogastric tube has been advanced with tip over the stomach in the left mid abdomen. Visualized bowel gas pattern is nonobstructive. No free peritoneal air. Partially visualized stent in the region of the left brachiocephalic vein. Remainder of the exam is unchanged. IMPRESSION: Interval advancement of nasogastric tube with tip over the stomach in the left mid abdomen. Nonobstructive bowel gas pattern. Electronically Signed   By: Marin Olp M.D.   On: 09/09/2019 09:32   ECHOCARDIOGRAM COMPLETE  Result Date: 09/07/2019    ECHOCARDIOGRAM REPORT   Patient Name:   Barbara [name] Date of Exam: 09/07/2019 Medical Rec #:  [id number]     Height:       66.0 in Accession #:    [id number]    Weight:       187.2 lb Date of Birth:  08-12-1940      BSA:          1.94 m Patient Age:    79 years      BP:           140/38 mmHg Patient Gender: F             HR:           90 bpm. Exam Location:  [facility] Procedure: 2D Echo, Cardiac Doppler and Color Doppler Indications:     Dyspnea 786.09  History:         Patient has no prior history of Echocardiogram examinations.                  CAD, COPD; Risk Factors:Hypertension and Diabetes. CKD.  Sonographer:     Charmayne Sheer RDCS (AE) Referring Phys:  [phone] Flora Lipps Diagnosing Phys: Ida Rogue  MD  Sonographer Comments: Echo performed with patient supine and on artificial respirator. IMPRESSIONS  1. Left ventricular ejection fraction, by estimation, is 60 to 65%. The left ventricle has normal function. The left ventricle has no regional wall motion abnormalities. Left ventricular diastolic parameters are consistent with Grade I diastolic dysfunction  (impaired relaxation).  2. Right ventricular systolic function is normal. The right ventricular size is normal. Tricuspid regurgitation signal is inadequate for assessing PA pressure. FINDINGS  Left Ventricle: Left ventricular ejection fraction, by estimation, is 60 to 65%. The left ventricle has normal function. The left ventricle has no regional wall motion abnormalities. The left ventricular internal cavity size was normal in size. There is  no left ventricular hypertrophy. Left ventricular diastolic parameters are consistent with Grade I diastolic dysfunction (impaired relaxation). Right Ventricle: The right ventricular size is normal. No increase in right ventricular wall thickness. Right ventricular systolic function is normal. Tricuspid regurgitation signal is inadequate for assessing PA pressure. Left Atrium: Left atrial size was normal in size. Right Atrium: Right atrial size was normal in size. Pericardium: There is no evidence of pericardial effusion. Mitral Valve: The mitral valve is normal in structure and function. Normal mobility of the mitral valve leaflets. No evidence of mitral valve regurgitation. No evidence of mitral valve stenosis. MV peak gradient, 4.5 mmHg. The mean mitral valve gradient is 2.0 mmHg. Tricuspid Valve: The tricuspid valve is normal in structure. Tricuspid valve regurgitation is trivial. No evidence of tricuspid stenosis. Aortic Valve: The aortic valve was not well visualized. Aortic valve regurgitation is not visualized. Mild to moderate aortic valve sclerosis/calcification is present, without any evidence of aortic stenosis. Aortic valve mean gradient measures 9.0 mmHg.  Aortic valve peak gradient measures 15.1 mmHg. Aortic valve area, by VTI measures 2.62 cm. Pulmonic Valve: The pulmonic valve was normal in structure. Pulmonic valve regurgitation is not visualized. No evidence of pulmonic stenosis. Aorta: The aortic root is normal in size and structure. Venous: The inferior  vena cava is normal in size with greater than 50% respiratory variability, suggesting right atrial pressure of 3 mmHg. IAS/Shunts: No atrial level shunt detected by color flow Doppler.  LEFT VENTRICLE PLAX 2D LVIDd:         4.04 cm  Diastology LVIDs:         2.89 cm  LV e' lateral:   6.96 cm/s LV PW:         0.74 cm  LV E/e' lateral: 10.5 LV IVS:        0.75 cm  LV e' medial:    4.68 cm/s LVOT diam:     1.90 cm  LV E/e' medial:  15.6 LV SV:         69.46 ml LV SV Index:   19.73 LVOT Area:     2.84 cm  LEFT ATRIUM         Index LA diam:    2.00 cm 1.03 cm/m  AORTIC VALVE                    PULMONIC VALVE AV Area (Vmax):    2.10 cm     PV Vmax:       1.71 m/s AV Area (Vmean):   2.14 cm     PV Vmean:      114.000 cm/s AV Area (VTI):     2.62 cm     PV VTI:        0.285 m AV Vmax:  194.00 cm/s  PV Peak grad:  11.7 mmHg AV Vmean:          142.000 cm/s PV Mean grad:  6.0 mmHg AV VTI:            0.265 m AV Peak Grad:      15.1 mmHg AV Mean Grad:      9.0 mmHg LVOT Vmax:         144.00 cm/s LVOT Vmean:        107.000 cm/s LVOT VTI:          0.245 m LVOT/AV VTI ratio: 0.92  AORTA Ao Root diam: 3.00 cm MITRAL VALVE MV Area (PHT): 3.77 cm     SHUNTS MV Peak grad:  4.5 mmHg     Systemic VTI:  0.24 m MV Mean grad:  2.0 mmHg     Systemic Diam: 1.90 cm MV Vmax:       1.06 m/s MV Vmean:      73.0 cm/s MV Decel Time: 201 msec MV E velocity: 72.80 cm/s MV A velocity: 105.00 cm/s MV E/A ratio:  0.69 Ida Rogue MD Electronically signed by Ida Rogue MD Signature Date/Time: 09/07/2019/12:56:14 PM    Final     Medications:  I have reviewed the patient's current medications. Scheduled: . ascorbic acid  500 mg Oral BID  . atorvastatin  40 mg Oral QPM  . atropine  1 mg Intravenous Once  . B-complex with vitamin C  1 tablet Per Tube QHS  . Chlorhexidine Gluconate Cloth  6 each Topical Q0600  . cholecalciferol  1,000 Units Oral Daily  . dexamethasone (DECADRON) injection  4 mg Intravenous Q6H  . dextrose  1  ampule Intravenous Once  . docusate  100 mg Oral Daily  . ferrous sulfate  325 mg Oral QPC breakfast  . heparin injection (subcutaneous)  5,000 Units Subcutaneous Q8H  . mouth rinse  15 mL Mouth Rinse BID  . midodrine  5 mg Per Tube TID  . oxybutynin  5 mg Oral TID  . pantoprazole sodium  40 mg Per Tube Daily    Assessment/Plan: 79 year old female with multiple medical problems including ESRD on hemodialysis who was admitted 2/16 for left total hip arthroplasty.1 day post op developedsevere respiratorydistress, severe metabolic acidosis and encephalopathyrequiring intubation.Seizure activity noted and patient started on Vimpat. Now extubated.  Remains altered. Patient has had episodes in the past when she became unresponsive with illnesses and required extensive amount of time for recovery. No past history of seizures. EEG reveals some evidence of frontal cortical irritability. No evidence of electrographic seizure activity.MRI of the brain personally reviewed and shows no acute changes.Patient likely responding to stressors from surgery and metabolic acidosis. Seizure may have been provoked as well. If this is the case, as in the past, patient will likely require a prolonged period of time for recovery.Tolerating Vimpat.   Recommendations: 1. Continue Vimpat at 157m q 12hours 2. Seizure precautions 3. Agree with addressing medical issues   LOS: 4 days   LAlexis Goodell MD Neurology [phone]/20/2021  10:20 AM

## 2019-09-09 NOTE — Progress Notes (Signed)
[place] for Electrolyte Monitoring and Replacement   Recent Labs: Potassium (mmol/L)  Date Value  09/09/2019 4.9  10/30/2014 3.2 (L)   Magnesium (mg/dL)  Date Value  09/09/2019 2.5 (H)  09/20/2011 1.9   Calcium (mg/dL)  Date Value  09/09/2019 8.2 (L)   Calcium, Total (mg/dL)  Date Value  08/07/2012 9.1   Albumin (g/dL)  Date Value  09/09/2019 2.4 (L)  08/02/2012 3.9   Phosphorus (mg/dL)  Date Value  09/09/2019 3.6   Sodium (mmol/L)  Date Value  09/09/2019 135  11/04/2016 138  08/07/2012 138     Assessment: 79 year old female s/p left total hip arthroplasty. Patient admitted to ICU post rapid response where patient was found to be unresponsive and required bipap. Noted to have seizure-like activity while receiving dialysis and was subsequently intubated for inability to protect airway. Pt currently on tube feed. Pharmacy consulted to manage electrolytes.  Goal of Therapy:  Electrolytes WNL  Plan:  K 4.9 Mag 2.5 Phos 3.6  Scr 3.45  No additional repletion is needed at this time.  Will continue to follow and manage electrolytes.  Chinita Greenland PharmD Clinical Pharmacist 09/09/2019

## 2019-09-09 NOTE — Progress Notes (Signed)
[facility]  ROUNDING NOTE   Subjective:   On CRRT. Weaned off dopamine and phenylephrine.  Extubated. On [place] O2   Awake but not alert.   Objective:  Vital signs in last 24 hours:  Temp:  [98.2 F (36.8 C)-98.9 F (37.2 C)] 98.8 F (37.1 C) (02/20 0700) Pulse Rate:  [83-102] 99 (02/20 0900) Resp:  [8-18] 16 (02/20 0900) BP: (63-154)/(30-78) 107/43 (02/20 0900) SpO2:  [94 %-100 %] 99 % (02/20 0900) FiO2 (%):  [24 %] 24 % (02/19 1115) Weight:  [83.7 kg] 83.7 kg (02/20 0500)  Weight change: -1.2 kg Filed Weights   09/06/19 0747 09/07/19 1600 09/09/19 0500  Weight: 84.9 kg 84.9 kg 83.7 kg    Intake/Output: I/O last 3 completed shifts: In: 3144.2 [I.V.:2280.8; Other:40; NG/GT:718.4; IV Piggyback:105] Out: 3188 [Other:3188]   Intake/Output this shift:  Total I/O In: 6.9 [I.V.:6.9] Out: 20 [Other:20]  Physical Exam: General: ill-appearing  Head: [place]/AT, NGT  Eyes: Anicteric  Neck: Supple, trachea midline  Lungs:  Diminished bilaterally 2L O2 [place]  Heart: tachycardia  Abdomen:  Obese, soft  Extremities: trace peripheral edema.  Neurologic: Moving all four extremities, withdraws to pain.   Skin: No lesions  Access: Left upper extremity AV graft, femoral dialysis catheter    Basic Metabolic Panel: Recent Labs  Lab 09/06/19 0532 09/06/19 1752 09/07/19 0529 09/07/19 0529 09/07/19 1105 09/07/19 1655 09/07/19 1655 09/08/19 0547 09/08/19 1601 09/09/19 0433  NA   < >  --  136  --   --  134*  --  135  134* 135 135  K   < >  --  2.9*   < > 4.2 3.3*  --  3.7  3.7 4.0 4.9  CL   < >  --  95*  --   --  91*  --  95*  94* 96* 99  CO2   < >  --  25  --   --  23  --  29  28 30 25   GLUCOSE   < >  --  122*  --   --  133*  --  127*  128* 137* 115*  BUN   < >  --  49*  --   --  58*  --  43*  47* 30* 24*  CREATININE   < >  --  10.09*  --   --  10.66*  --  7.20*  7.16* 4.31* 3.45*  CALCIUM   < >  --  7.4*   < >  --  7.5*   < > 7.7*  7.7* 7.6* 8.2*  MG  --   2.0 1.8  --  2.3  --   --  2.3  --  2.5*  PHOS   < >  --  8.9*  --   --  9.1*  --  6.4*  6.5* 3.6 3.6   < > = values in this interval not displayed.    Liver Function Tests: Recent Labs  Lab 09/07/19 1655 09/08/19 0547 09/08/19 1601 09/09/19 0433  ALBUMIN 2.8* 2.4* 2.5* 2.4*   No results for input(s): LIPASE, AMYLASE in the last 168 hours. No results for input(s): AMMONIA in the last 168 hours.  CBC: Recent Labs  Lab 09/05/19 0647 09/05/19 1435 09/06/19 0532 09/07/19 0529 09/09/19 0433  WBC  --  9.8 8.1 11.9* 18.3*  NEUTROABS  --   --   --   --  17.0*  HGB 15.0 11.6* 10.5* 10.9*  10.9*  HCT 44.0 37.0 34.2* 34.5* 35.0*  MCV  --  99.7 100.3* 100.0 100.9*  PLT  --  146* 132* 120* 120*    Cardiac Enzymes: No results for input(s): CKTOTAL, CKMB, CKMBINDEX, TROPONINI in the last 168 hours.  BNP: Invalid input(s): POCBNP  CBG: Recent Labs  Lab 09/08/19 1917 09/09/19 0007 09/09/19 0328 09/09/19 0414 09/09/19 0705  GLUCAP 94 97 114* 126* 107*    Microbiology: Results for orders placed or performed during the hospital encounter of 09/05/19  MRSA PCR Screening     Status: Abnormal   Collection Time: 09/06/19  7:50 AM   Specimen: Nasal Mucosa; Nasopharyngeal  Result Value Ref Range Status   MRSA by PCR POSITIVE (A) NEGATIVE Final    Comment:        The GeneXpert MRSA Assay (FDA approved for NASAL specimens only), is one component of a comprehensive MRSA colonization surveillance program. It is not intended to diagnose MRSA infection nor to guide or monitor treatment for MRSA infections. RESULT CALLED TO, READ BACK BY AND VERIFIED WITH: MEGAN SHEFFIELD AT 0931 ON 09/06/2019 [place]. Performed at [facility], [address] [place], [place] [address]     Coagulation Studies: No results for input(s): LABPROT, INR in the last 72 hours.  Urinalysis: No results for input(s): COLORURINE, LABSPEC, PHURINE, GLUCOSEU, HGBUR, BILIRUBINUR, KETONESUR,  PROTEINUR, UROBILINOGEN, NITRITE, LEUKOCYTESUR in the last 72 hours.  Invalid input(s): APPERANCEUR    Imaging: DG Abd 1 View  Result Date: 09/09/2019 CLINICAL DATA:  NG tube placement. EXAM: ABDOMEN - 1 VIEW COMPARISON:  09/06/2019 FINDINGS: Nasogastric tube has been advanced with tip over the stomach in the left mid abdomen. Visualized bowel gas pattern is nonobstructive. No free peritoneal air. Partially visualized stent in the region of the left brachiocephalic vein. Remainder of the exam is unchanged. IMPRESSION: Interval advancement of nasogastric tube with tip over the stomach in the left mid abdomen. Nonobstructive bowel gas pattern. Electronically Signed   By: Marin Olp M.D.   On: 09/09/2019 09:32   ECHOCARDIOGRAM COMPLETE  Result Date: 09/07/2019    ECHOCARDIOGRAM REPORT   Patient Name:   Barbara [name] Date of Exam: 09/07/2019 Medical Rec #:  [id number]     Height:       66.0 in Accession #:    [id number]    Weight:       187.2 lb Date of Birth:  [birth year]      BSA:          1.94 m Patient Age:    79 years      BP:           140/38 mmHg Patient Gender: F             HR:           90 bpm. Exam Location:  [facility] Procedure: 2D Echo, Cardiac Doppler and Color Doppler Indications:     Dyspnea 786.09  History:         Patient has no prior history of Echocardiogram examinations.                  CAD, COPD; Risk Factors:Hypertension and Diabetes. CKD.  Sonographer:     Charmayne Sheer RDCS (AE) Referring Phys:  [id number] Flora Lipps Diagnosing Phys: Ida Rogue MD  Sonographer Comments: Echo performed with patient supine and on artificial respirator. IMPRESSIONS  1. Left ventricular ejection fraction, by estimation, is 60 to 65%. The left ventricle has normal function.  The left ventricle has no regional wall motion abnormalities. Left ventricular diastolic parameters are consistent with Grade I diastolic dysfunction (impaired relaxation).  2. Right ventricular systolic function is normal. The right  ventricular size is normal. Tricuspid regurgitation signal is inadequate for assessing PA pressure. FINDINGS  Left Ventricle: Left ventricular ejection fraction, by estimation, is 60 to 65%. The left ventricle has normal function. The left ventricle has no regional wall motion abnormalities. The left ventricular internal cavity size was normal in size. There is  no left ventricular hypertrophy. Left ventricular diastolic parameters are consistent with Grade I diastolic dysfunction (impaired relaxation). Right Ventricle: The right ventricular size is normal. No increase in right ventricular wall thickness. Right ventricular systolic function is normal. Tricuspid regurgitation signal is inadequate for assessing PA pressure. Left Atrium: Left atrial size was normal in size. Right Atrium: Right atrial size was normal in size. Pericardium: There is no evidence of pericardial effusion. Mitral Valve: The mitral valve is normal in structure and function. Normal mobility of the mitral valve leaflets. No evidence of mitral valve regurgitation. No evidence of mitral valve stenosis. MV peak gradient, 4.5 mmHg. The mean mitral valve gradient is 2.0 mmHg. Tricuspid Valve: The tricuspid valve is normal in structure. Tricuspid valve regurgitation is trivial. No evidence of tricuspid stenosis. Aortic Valve: The aortic valve was not well visualized. Aortic valve regurgitation is not visualized. Mild to moderate aortic valve sclerosis/calcification is present, without any evidence of aortic stenosis. Aortic valve mean gradient measures 9.0 mmHg.  Aortic valve peak gradient measures 15.1 mmHg. Aortic valve area, by VTI measures 2.62 cm. Pulmonic Valve: The pulmonic valve was normal in structure. Pulmonic valve regurgitation is not visualized. No evidence of pulmonic stenosis. Aorta: The aortic root is normal in size and structure. Venous: The inferior vena cava is normal in size with greater than 50% respiratory variability,  suggesting right atrial pressure of 3 mmHg. IAS/Shunts: No atrial level shunt detected by color flow Doppler.  LEFT VENTRICLE PLAX 2D LVIDd:         4.04 cm  Diastology LVIDs:         2.89 cm  LV e' lateral:   6.96 cm/s LV PW:         0.74 cm  LV E/e' lateral: 10.5 LV IVS:        0.75 cm  LV e' medial:    4.68 cm/s LVOT diam:     1.90 cm  LV E/e' medial:  15.6 LV SV:         69.46 ml LV SV Index:   19.73 LVOT Area:     2.84 cm  LEFT ATRIUM         Index LA diam:    2.00 cm 1.03 cm/m  AORTIC VALVE                    PULMONIC VALVE AV Area (Vmax):    2.10 cm     PV Vmax:       1.71 m/s AV Area (Vmean):   2.14 cm     PV Vmean:      114.000 cm/s AV Area (VTI):     2.62 cm     PV VTI:        0.285 m AV Vmax:           194.00 cm/s  PV Peak grad:  11.7 mmHg AV Vmean:          142.000 cm/s PV Mean grad:  6.0 mmHg AV VTI:            0.265 m AV Peak Grad:      15.1 mmHg AV Mean Grad:      9.0 mmHg LVOT Vmax:         144.00 cm/s LVOT Vmean:        107.000 cm/s LVOT VTI:          0.245 m LVOT/AV VTI ratio: 0.92  AORTA Ao Root diam: 3.00 cm MITRAL VALVE MV Area (PHT): 3.77 cm     SHUNTS MV Peak grad:  4.5 mmHg     Systemic VTI:  0.24 m MV Mean grad:  2.0 mmHg     Systemic Diam: 1.90 cm MV Vmax:       1.06 m/s MV Vmean:      73.0 cm/s MV Decel Time: 201 msec MV E velocity: 72.80 cm/s MV A velocity: 105.00 cm/s MV E/A ratio:  0.69 Ida Rogue MD Electronically signed by Ida Rogue MD Signature Date/Time: 09/07/2019/12:56:14 PM    Final      Medications:   .  prismasol BGK 4/2.5 500 mL/hr at 09/08/19 1530  .  prismasol BGK 4/2.5 500 mL/hr at 09/08/19 1624  . sodium chloride Stopped (09/06/19 0721)  . sodium chloride    . sodium chloride    . sodium chloride    . DOPamine 2 mcg/kg/min (09/09/19 0800)  . lacosamide (VIMPAT) IV 100 mg (09/09/19 0941)  . phenylephrine (NEO-SYNEPHRINE) Adult infusion 10 mcg/min (09/09/19 0824)  . prismasol BGK 4/2.5 1,500 mL/hr at 09/09/19 0851   . ascorbic acid  500 mg Oral  BID  . atorvastatin  40 mg Oral QPM  . atropine  1 mg Intravenous Once  . B-complex with vitamin C  1 tablet Per Tube QHS  . calcium acetate  1,334 mg Oral TID WC  . calcium carbonate  1 tablet Oral Daily  . Chlorhexidine Gluconate Cloth  6 each Topical Q0600  . cholecalciferol  1,000 Units Oral Daily  . cinacalcet  30 mg Oral Daily  . dexamethasone (DECADRON) injection  4 mg Intravenous Q6H  . dextrose  1 ampule Intravenous Once  . docusate  100 mg Oral Daily  . ferrous sulfate  325 mg Oral QPC breakfast  . heparin injection (subcutaneous)  5,000 Units Subcutaneous Q8H  . mouth rinse  15 mL Mouth Rinse BID  . midodrine  5 mg Per Tube TID  . oxybutynin  5 mg Oral TID  . pantoprazole sodium  40 mg Per Tube Daily   sodium chloride, sodium chloride, acetaminophen, alteplase, bisacodyl, diphenhydrAMINE, heparin, heparin, hydrALAZINE, HYDROcodone-acetaminophen, ipratropium-albuterol, lidocaine (PF), lidocaine-prilocaine, loperamide HCl, magnesium hydroxide, metoCLOPramide **OR** metoCLOPramide (REGLAN) injection, morphine injection, ondansetron **OR** ondansetron (ZOFRAN) IV, pentafluoroprop-tetrafluoroeth, Racepinephrine HCl, sodium phosphate  Assessment/ Plan:  79 y.o. female  Ms. Wanza SHELAGH RAYMAN is a 79 y.o. black female with end stage renal disease on hemodialysis, hypertension, diabetes mellitus type II, diabetic neuropathy, seizure disorder who was admitted for left hip repair and unfortunate developed seizure episode postoperatively and subsequently intubated. Required vasopressors and placed on CRRT.   [facility] MWF [facility] LUE AVG 78.5kg   1.  ESRD on HD MWF. Weaned off vasopressors - Discontinue CRRT - Next scheduled dialysis treatment for Monday.   2.  Acute respiratory failure: intubated for airway protection. Extubated.   3.  Anemia of chronic kidney disease.  Hemoglobin 10.9.   - holding EPO  4.  Secondary hyperparathyroidism.  Phosphorus  3.6.  - discontinue  calcium acetate and calcium carbonate and discontinue cinacalcet until able to take PO.   5. Hypotension: placed on midodrine. Holding home blood pressure regimen of losartan and furosemide.    LOS: 4 Llewellyn Choplin 2/20/202110:06 AM

## 2019-09-09 NOTE — Progress Notes (Signed)
CRITICAL CARE NOTE 79 yo AAF admitted to ICU for severe resp failure S/p LEFT hip replacement for OA and PAIN Patient with ESRD on HD  Transferred to ICU for severe resp distress, severe metabolic acidosis and encephalopathy  PH 7.16 Placed on biPAP +ABD distention   Significant events 2/17 transferred to ICU for increased work of breathing respiratory failure 2/17 emergently intubated, MRI of the brain did not show acute stroke Patient with severe metabolic acidosis, presumed HD 2/17 vascular surgery placed triple lumen dialysis catheter right groin 2/17 family at bedside updated and notified of clinical status. Patient was made DNR 2/19 tolerating spontaneous breathing trial, extubated 2/20 CRRT discontinued, will restart HD   CC  follow up respiratory failure  SUBJECTIVE She opens her eyes and regards examiner but does not follow commands Psychomotor retardation noted Appears to be able to protect her airway with strong cough MRI of the brain negative for acute strokes Required racemic epi last night and nebs due to stridor, no further issues with stridor.    CBC    Component Value Date/Time   WBC 18.3 (H) 09/09/2019 0433   RBC 3.47 (L) 09/09/2019 0433   HGB 10.9 (L) 09/09/2019 0433   HGB 10.2 (L) 08/07/2012 0433   HCT 35.0 (L) 09/09/2019 0433   HCT 30.7 (L) 08/07/2012 0433   PLT 120 (L) 09/09/2019 0433   PLT 294 08/07/2012 0433   MCV 100.9 (H) 09/09/2019 0433   MCV 85 08/07/2012 0433   MCH 31.4 09/09/2019 0433   MCHC 31.1 09/09/2019 0433   RDW 14.2 09/09/2019 0433   RDW 13.7 08/07/2012 0433   LYMPHSABS 0.6 (L) 09/09/2019 0433   LYMPHSABS 1.6 08/07/2012 0433   MONOABS 0.4 09/09/2019 0433   MONOABS 0.4 08/07/2012 0433   EOSABS 0.0 09/09/2019 0433   EOSABS 0.0 08/07/2012 0433   BASOSABS 0.0 09/09/2019 0433   BASOSABS 0 11/22/2013 1935   BMP Latest Ref Rng & Units 09/09/2019 09/08/2019 09/08/2019  Glucose 70 - 99 mg/dL 115(H) 137(H) 127(H)  BUN 8 - 23  mg/dL 24(H) 30(H) 43(H)  Creatinine 0.44 - 1.00 mg/dL 3.45(H) 4.31(H) 7.20(H)  BUN/Creat Ratio 12 - 28 - - -  Sodium 135 - 145 mmol/L 135 135 135  Potassium 3.5 - 5.1 mmol/L 4.9 4.0 3.7  Chloride 98 - 111 mmol/L 99 96(L) 95(L)  CO2 22 - 32 mmol/L 25 30 29   Calcium 8.9 - 10.3 mg/dL 8.2(L) 7.6(L) 7.7(L)      BP (!) 107/43   Pulse 99   Temp 98.8 F (37.1 C) (Axillary)   Resp 16   Ht 5' 6"  (1.676 m)   Wt 83.7 kg   SpO2 99%   BMI 29.78 kg/m    I/O last 3 completed shifts: In: 3144.2 [I.V.:2280.8; Other:40; NG/GT:718.4; IV Piggyback:105] Out: 3188 [Other:3188] Total I/O In: 6.9 [I.V.:6.9] Out: 20 [Other:20]  SpO2: 99 % O2 Flow Rate (L/min): 2 L/min FiO2 (%): 24 %     REVIEW OF SYSTEMS:  Unable to obtain due to patient's mental status.  PHYSICAL EXAMINATION:  GENERAL: Chronically ill-appearing, elderly woman, encephalopathic HEAD: Normocephalic, atraumatic.  EYES: Pupils equal, round, reactive to light.  No scleral icterus.  MOUTH:  Oral mucosa moist.  Very poor dentition. NECK: Supple.  Trachea midline, no JVD, no crepitus.  No stridor. PULMONARY: Coarse breath sounds, no other adventitious sounds. CARDIOVASCULAR: S1 and S2. Regular rate and rhythm.  No rubs or gallops, grade 1/6 to 2/6 systolic ejection murmur at the right sternal  border. GASTROINTESTINAL: Soft, non-distended.  Positive bowel sounds. MUSCULOSKELETAL: No joint deformity, no clubbing, no edema.  NEUROLOGIC: Opens eyes, regards examiner, does not follow commands, strong cough.  Moves all fours spontaneously.  Psychomotor retardation noted. SKIN:intact,warm,dry.  Limited exam shows no rashes.  MEDICATIONS: I have reviewed all medications and confirmed regimen as documented   CULTURE RESULTS   Recent Results (from the past 240 hour(s))  SARS CORONAVIRUS 2 (TAT 6-24 HRS) Nasopharyngeal Nasopharyngeal Swab     Status: None   Collection Time: 09/01/19 11:59 AM   Specimen: Nasopharyngeal Swab   Result Value Ref Range Status   SARS Coronavirus 2 NEGATIVE NEGATIVE Final    Comment: (NOTE) SARS-CoV-2 target nucleic acids are NOT DETECTED. The SARS-CoV-2 RNA is generally detectable in upper and lower respiratory specimens during the acute phase of infection. Negative results do not preclude SARS-CoV-2 infection, do not rule out co-infections with other pathogens, and should not be used as the sole basis for treatment or other patient management decisions. Negative results must be combined with clinical observations, patient history, and epidemiological information. The expected result is Negative. Fact Sheet for Patients: SugarRoll.be Fact Sheet for Healthcare Providers: https://www.woods-mathews.com/ This test is not yet approved or cleared by the [place] FDA and  has been authorized for detection and/or diagnosis of SARS-CoV-2 by FDA under an Emergency Use Authorization (EUA). This EUA will remain  in effect (meaning this test can be used) for the duration of the COVID-19 declaration under Section 56 4(b)(1) of the Act, 21 U.S.C. section 360bbb-3(b)(1), unless the authorization is terminated or revoked sooner. Performed at [facility], [place] [address], [place], [place] [address]   Surgical pcr screen     Status: Abnormal   Collection Time: 09/01/19 11:59 AM   Specimen: Nasal Mucosa; Nasal Swab  Result Value Ref Range Status   MRSA, PCR POSITIVE (A) NEGATIVE Final    Comment: RESULT CALLED TO, READ BACK BY AND VERIFIED WITH: HEATHER SILVER @1545  09/01/19 MJU    Staphylococcus aureus POSITIVE (A) NEGATIVE Final    Comment: (NOTE) The Xpert SA Assay (FDA approved for NASAL specimens in patients 44 years of age and older), is one component of a comprehensive surveillance program. It is not intended to diagnose infection nor to guide or monitor treatment. Performed at [facility], [address]  [place],  [address]   MRSA PCR Screening     Status: Abnormal   Collection Time: 09/06/19  7:50 AM   Specimen: Nasal Mucosa; Nasopharyngeal  Result Value Ref Range Status   MRSA by PCR POSITIVE (A) NEGATIVE Final    Comment:        The GeneXpert MRSA Assay (FDA approved for NASAL specimens only), is one component of a comprehensive MRSA colonization surveillance program. It is not intended to diagnose MRSA infection nor to guide or monitor treatment for MRSA infections. RESULT CALLED TO, READ BACK BY AND VERIFIED WITH: MEGAN SHEFFIELD AT 0931 ON 09/06/2019 [place]. Performed at [facility], [address] [place],  [address]           IMAGING    No results found.     Indwelling Urinary Catheter  discontinued   Reason to continue Indwelling Urinary Catheter    Central Line/  Discontinue if HD resumed and AV fistula can be utilized  Reason to continue [employer] of central venous pressure or other hemodynamic parameters and poor IV access, CRRT access.   Ventilator  extubated  Ventilator Sedation  N/A      ASSESSMENT AND PLAN:  Acute hypoxic and Hypercapnic Respiratory Failure Extubated Initially intubated for airway protection and management of hypercarbia due to encephalopathy/obtundation She shows persistent psychomotor retardation Extubated yesterday Transient episode of stridor resolved with racemic epi, nebulization treatments, and pulmonary toilet As needed BiPAP    Diastolic dysfunction of the left ventricle Aortic valve sclerosis without overt stenosis Monitor for volume overload CRRT will be discontinued Monitor closely for increased need of volume removal during HD   ESRD on HD MWF Discontinued CRRT incision back to HD Hopefully extubation will help hemodynamics during HD Nephrology following, discussed with Dr. Juleen China Discontinue temporary dialysis catheter if fistula can be used for HD Monitor renal  panel  Toxic metabolic encephalopathy Seizures MRI did not show acute event/process Continue Vimpat Neurology following, discussed with Dr. Nolon Rod patient has had similar episodes previously, slow to recover  GI Presumed ileus resolved Protonix for GI prophylaxis  NUTRITION Has been on tube feeds Not able to participate for swallow evaluation Place NG for feeds Resume tube feeds Constipation protocol as indicated  ID Leukocytosis noted Query aspiration pneumonitis from prior Unasyn  DVT prophylaxis On heparin for CRRT  MRSA colonization of nares Bactroban protocol   Overall, patient is critically ill, prognosis is guarded.  Patient is DNR.   Renold Don, MD [facility]    *This note was dictated using voice recognition software/Dragon.  Despite best efforts to proofread, errors can occur which can change the meaning.  Any change was purely unintentional.

## 2019-09-09 NOTE — Progress Notes (Signed)
Rinsing back blood and d/c CRRT per order.

## 2019-09-10 LAB — BASIC METABOLIC PANEL
Anion gap: 12 (ref 5–15)
BUN: 49 mg/dL — ABNORMAL HIGH (ref 8–23)
CO2: 24 mmol/L (ref 22–32)
Calcium: 8.2 mg/dL — ABNORMAL LOW (ref 8.9–10.3)
Chloride: 100 mmol/L (ref 98–111)
Creatinine, Ser: 4.62 mg/dL — ABNORMAL HIGH (ref 0.44–1.00)
GFR calc Af Amer: 10 mL/min — ABNORMAL LOW (ref >60)
GFR calc non Af Amer: 8 mL/min — ABNORMAL LOW (ref >60)
Glucose, Bld: 115 mg/dL — ABNORMAL HIGH (ref 70–99)
Potassium: 4.6 mmol/L (ref 3.5–5.1)
Sodium: 136 mmol/L (ref 135–145)

## 2019-09-10 LAB — CBC
HCT: 27.8 % — ABNORMAL LOW (ref 36.0–46.0)
Hemoglobin: 8.7 g/dL — ABNORMAL LOW (ref 12.0–15.0)
MCH: 31.3 pg (ref 26.0–34.0)
MCHC: 31.3 g/dL (ref 30.0–36.0)
MCV: 100 fL (ref 80.0–100.0)
Platelets: 137 10*3/uL — ABNORMAL LOW (ref 150–400)
RBC: 2.78 MIL/uL — ABNORMAL LOW (ref 3.87–5.11)
RDW: 14.3 % (ref 11.5–15.5)
WBC: 12.9 10*3/uL — ABNORMAL HIGH (ref 4.0–10.5)
nRBC: 0 % (ref 0.0–0.2)

## 2019-09-10 LAB — GLUCOSE, CAPILLARY
Glucose-Capillary: 107 mg/dL — ABNORMAL HIGH (ref 70–99)
Glucose-Capillary: 107 mg/dL — ABNORMAL HIGH (ref 70–99)
Glucose-Capillary: 110 mg/dL — ABNORMAL HIGH (ref 70–99)
Glucose-Capillary: 112 mg/dL — ABNORMAL HIGH (ref 70–99)
Glucose-Capillary: 121 mg/dL — ABNORMAL HIGH (ref 70–99)
Glucose-Capillary: 96 mg/dL (ref 70–99)

## 2019-09-10 MED ORDER — HEPARIN SODIUM (PORCINE) 5000 UNIT/ML IJ SOLN
5000.0000 [IU] | Freq: Two times a day (BID) | INTRAMUSCULAR | Status: DC
  Administered 2019-09-10 – 2019-09-24 (×25): 5000 [IU] via SUBCUTANEOUS
  Filled 2019-09-10 (×28): qty 1

## 2019-09-10 NOTE — Progress Notes (Signed)
Pt has transfer orders however MD Dr Norma Fredrickson mentioned the possibility of removing the trialysis catheter. Awaiting response from physician before transfer.

## 2019-09-10 NOTE — Progress Notes (Signed)
CRITICAL CARE NOTE 79 yo AAF admitted to ICU for severe resp failure S/p LEFT hip replacement for OA and PAIN Patient with ESRD on HD  Transferred to ICU for severe resp distress, severe metabolic acidosis and encephalopathy  PH 7.16 Placed on biPAP +ABD distention   Significant events 2/17 transferred to ICU for increased work of breathing respiratory failure 2/17 emergently intubated, MRI of the brain did not show acute stroke Patient with severe metabolic acidosis, presumed HD 2/17 vascular surgery placed triple lumen dialysis catheter right groin 2/17 family at bedside updated and notified of clinical status. Patient was made DNR 2/19 tolerating spontaneous breathing trial, extubated 2/20 CRRT discontinued, will restart HD 2/21 remains encephalopathic however protecting airway well   CC  follow up respiratory failure  SUBJECTIVE She opens her eyes and regards examiner but does not follow commands Apparently was mumbling and trying to speak last night Psychomotor retardation noted Able to protect her airway has strong cough MRI of the brain negative for acute strokes No further issues with stridor noted has not required noninvasive ventilation    CBC    Component Value Date/Time   WBC 12.9 (H) 09/10/2019 0420   RBC 2.78 (L) 09/10/2019 0420   HGB 8.7 (L) 09/10/2019 0420   HGB 10.2 (L) 08/07/2012 0433   HCT 27.8 (L) 09/10/2019 0420   HCT 30.7 (L) 08/07/2012 0433   PLT 137 (L) 09/10/2019 0420   PLT 294 08/07/2012 0433   MCV 100.0 09/10/2019 0420   MCV 85 08/07/2012 0433   MCH 31.3 09/10/2019 0420   MCHC 31.3 09/10/2019 0420   RDW 14.3 09/10/2019 0420   RDW 13.7 08/07/2012 0433   LYMPHSABS 0.6 (L) 09/09/2019 0433   LYMPHSABS 1.6 08/07/2012 0433   MONOABS 0.4 09/09/2019 0433   MONOABS 0.4 08/07/2012 0433   EOSABS 0.0 09/09/2019 0433   EOSABS 0.0 08/07/2012 0433   BASOSABS 0.0 09/09/2019 0433   BASOSABS 0 11/22/2013 1935   BMP Latest Ref Rng & Units  09/10/2019 09/09/2019 09/08/2019  Glucose 70 - 99 mg/dL 115(H) 115(H) 137(H)  BUN 8 - 23 mg/dL 49(H) 24(H) 30(H)  Creatinine 0.44 - 1.00 mg/dL 4.62(H) 3.45(H) 4.31(H)  BUN/Creat Ratio 12 - 28 - - -  Sodium 135 - 145 mmol/L 136 135 135  Potassium 3.5 - 5.1 mmol/L 4.6 4.9 4.0  Chloride 98 - 111 mmol/L 100 99 96(L)  CO2 22 - 32 mmol/L 24 25 30   Calcium 8.9 - 10.3 mg/dL 8.2(L) 8.2(L) 7.6(L)      BP (!) 142/53 (BP Location: Right Arm)   Pulse 89   Temp 98.9 F (37.2 C) (Axillary)   Resp (!) 23   Ht 5' 6"  (1.676 m)   Wt 85.7 kg   SpO2 96%   BMI 30.49 kg/m    I/O last 3 completed shifts: In: 196.5 [I.V.:161.5; IV Piggyback:35] Out: 245 [Other:245] No intake/output data recorded.  SpO2: 96 % O2 Flow Rate (L/min): 0.5 L/min FiO2 (%): 24 %     REVIEW OF SYSTEMS:  Unable to obtain due to patient's mental status.  PHYSICAL EXAMINATION:  GENERAL: Chronically ill-appearing, elderly woman, encephalopathic HEAD: Normocephalic, atraumatic.  NG in place. EYES: Pupils equal, round, reactive to light.  No scleral icterus.  MOUTH:  Oral mucosa moist.  Very poor dentition. NECK: Supple.  Trachea midline, no JVD, no crepitus.  No stridor. PULMONARY: Coarse breath sounds, no other adventitious sounds. CARDIOVASCULAR: S1 and S2. Regular rate and rhythm.  No rubs or gallops, grade  1/6 to 2/6 systolic ejection murmur at the right sternal border. GASTROINTESTINAL: Soft, non-distended.  Positive bowel sounds. MUSCULOSKELETAL: No joint deformity, no clubbing, no edema.  NEUROLOGIC: Opens eyes, regards examiner, does not follow commands, strong cough.  Moves all fours spontaneously.  Psychomotor retardation noted. SKIN:intact,warm,dry.  Limited exam shows no rashes.  MEDICATIONS: I have reviewed all medications and confirmed regimen as documented   CULTURE RESULTS   Recent Results (from the past 240 hour(s))  SARS CORONAVIRUS 2 (TAT 6-24 HRS) Nasopharyngeal Nasopharyngeal Swab     Status:  None   Collection Time: 09/01/19 11:59 AM   Specimen: Nasopharyngeal Swab  Result Value Ref Range Status   SARS Coronavirus 2 NEGATIVE NEGATIVE Final    Comment: (NOTE) SARS-CoV-2 target nucleic acids are NOT DETECTED. The SARS-CoV-2 RNA is generally detectable in upper and lower respiratory specimens during the acute phase of infection. Negative results do not preclude SARS-CoV-2 infection, do not rule out co-infections with other pathogens, and should not be used as the sole basis for treatment or other patient management decisions. Negative results must be combined with clinical observations, patient history, and epidemiological information. The expected result is Negative. Fact Sheet for Patients: SugarRoll.be Fact Sheet for Healthcare Providers: https://www.woods-mathews.com/ This test is not yet approved or cleared by the [place] FDA and  has been authorized for detection and/or diagnosis of SARS-CoV-2 by FDA under an Emergency Use Authorization (EUA). This EUA will remain  in effect (meaning this test can be used) for the duration of the COVID-19 declaration under Section 56 4(b)(1) of the Act, 21 U.S.C. section 360bbb-3(b)(1), unless the authorization is terminated or revoked sooner. Performed at [facility], [place] [address], [place], [place] [address]   Surgical pcr screen     Status: Abnormal   Collection Time: 09/01/19 11:59 AM   Specimen: Nasal Mucosa; Nasal Swab  Result Value Ref Range Status   MRSA, PCR POSITIVE (A) NEGATIVE Final    Comment: RESULT CALLED TO, READ BACK BY AND VERIFIED WITH: HEATHER SILVER @1545  09/01/19 MJU    Staphylococcus aureus POSITIVE (A) NEGATIVE Final    Comment: (NOTE) The Xpert SA Assay (FDA approved for NASAL specimens in patients 57 years of age and older), is one component of a comprehensive surveillance program. It is not intended to diagnose infection nor to guide or monitor  treatment. Performed at [facility], [address] [place], [place] [address]   MRSA PCR Screening     Status: Abnormal   Collection Time: 09/06/19  7:50 AM   Specimen: Nasal Mucosa; Nasopharyngeal  Result Value Ref Range Status   MRSA by PCR POSITIVE (A) NEGATIVE Final    Comment:        The GeneXpert MRSA Assay (FDA approved for NASAL specimens only), is one component of a comprehensive MRSA colonization surveillance program. It is not intended to diagnose MRSA infection nor to guide or monitor treatment for MRSA infections. RESULT CALLED TO, READ BACK BY AND VERIFIED WITH: MEGAN SHEFFIELD AT 0931 ON 09/06/2019 [place]. Performed at [facility], [address], [place], [place] [address]           IMAGING    No results found.     Indwelling Urinary Catheter  discontinued   Reason to continue Indwelling Urinary Catheter    Central Line/  Discontinue if HD resumed and AV fistula can be utilized  Reason to continue [employer] of central venous pressure or other hemodynamic parameters and  poor IV access, CRRT access.   Ventilator  extubated   Ventilator Sedation  N/A      ASSESSMENT AND PLAN:  Acute hypoxic and Hypercapnic Respiratory Failure Extubated Resolved Initially intubated for airway protection and management of hypercarbia due to encephalopathy/obtundation She shows persistent psychomotor retardation Extubated 2/19 Transient episode of stridor resolved with racemic epi, nebulization treatments, and pulmonary toilet As needed BiPAP, has not required Weaned off of O2 Stable for transfer to MedSurg floor   Diastolic dysfunction of the left ventricle Aortic valve sclerosis without overt stenosis Monitor for volume overload CRRT discontinued Monitor closely for increased need of volume removal during HD   ESRD on HD MWF Discontinued CRRT transition back to HD Off of pressors, will discontinue midodrine as BP  mildly elevated Nephrology following, discussed with Dr. Juleen China Discontinue temporary dialysis catheter today Monitor renal panel   Toxic metabolic encephalopathy Seizures MRI did not show acute event/process Continue Vimpat Neurology following, discussed with Dr. Doy Mince yesterday Appears patient has had similar episodes previously, slow to recover, 1 to 2 weeks May need SNF temporarily "Cleaned up" MAR Avoid sedatives  GI Presumed ileus on initial ICU presentation, resolved Protonix for GI prophylaxis   NUTRITION Has been on tube feeds Not able to participate for swallow evaluation Place NG for feeds Resume tube feeds per recommendations of nutritionist consider bolus feeds Constipation protocol as indicated  ID Leukocytosis noted, resolving  18.3>>12.9 Likely aspiration pneumonia versus margination from steroids Steroids stopped yesterday, no indication Unasyn started 2/20  DVT prophylaxis Heparin subcu Monitor mild thrombocytopenia Continue SCDs  MRSA colonization of nares Bactroban protocol completed  Overall, patient is critically ill, prognosis is guarded.  Patient is DNR.  Will likely need case manager consultation for potential SNF short-term.  PT OT eval's when more alert.   Renold Don, MD [facility]    *This note was dictated using voice recognition software/Dragon.  Despite best efforts to proofread, errors can occur which can change the meaning.  Any change was purely unintentional.

## 2019-09-10 NOTE — Progress Notes (Signed)
Subjective: 5 Days Post-Op Procedure(s) (LRB): TOTAL HIP ARTHROPLASTY (Left) Patient with NG tube in place.  Nasal cannula on. Appears more awake this morning. Is more responsive to me this AM. Mild discomfort when palpating the left hip.  Objective: Vital signs in last 24 hours: Temp:  [97.1 F (36.2 C)-100 F (37.8 C)] 98.9 F (37.2 C) (02/21 0900) Pulse Rate:  [81-101] 89 (02/21 0900) Resp:  [13-23] 23 (02/21 0900) BP: (103-143)/(40-63) 142/53 (02/21 0900) SpO2:  [93 %-98 %] 96 % (02/21 0900) Weight:  [85.7 kg] 85.7 kg (02/21 0344)  Intake/Output from previous day:  Intake/Output Summary (Last 24 hours) at 09/10/2019 0916 Last data filed at 09/10/2019 0705 Gross per 24 hour  Intake 0 ml  Output 10 ml  Net -10 ml    Intake/Output this shift: No intake/output data recorded.  Labs: Recent Labs    09/09/19 0433 09/10/19 0420  HGB 10.9* 8.7*   Recent Labs    09/09/19 0433 09/10/19 0420  WBC 18.3* 12.9*  RBC 3.47* 2.78*  HCT 35.0* 27.8*  PLT 120* 137*   Recent Labs    09/09/19 0433 09/10/19 0420  NA 135 136  K 4.9 4.6  CL 99 100  CO2 25 24  BUN 24* 49*  CREATININE 3.45* 4.62*  GLUCOSE 115* 115*  CALCIUM 8.2* 8.2*   No results for input(s): LABPT, INR in the last 72 hours.   EXAM General - Patient is awake but not alert Dressing intact to the left hip, no drainage. Patient is moving toes and feet this AM. With palpation to the left hip she does show some signs of mild discomfort.  Past Medical History:  Diagnosis Date  . Adult celiac disease   . Anemia associated with chronic renal failure 2017   blood transfusion last week 10/17  . Arthritis   . Asthma   . 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 Chronic kidney disease   . 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  dr. Candiss Norse and dr. Holley Raring follow her  . COPD (chronic obstructive pulmonary disease) ([place])   . Coronary artery disease   . Diabetes mellitus without complication ([place])   .  Dialysis patient ([place])    Tues, Thurs, Sat  . Dyspnea    with exertion  . Elevated lipids   . GERD (gastroesophageal reflux disease)   . Hematuria   . Hypertension   . Lower back pain   . Neuropathy   . Oxygen dependent    at hs  . Personal history of chemotherapy 2017   bladder ca  . Personal history of radiation therapy 2017   bladder ca  . Sleep apnea    uses cpap  . Urinary obstruction 01/2016   Assessment/Plan: 5 Days Post-Op Procedure(s) (LRB): TOTAL HIP ARTHROPLASTY (Left) Principal Problem:   Status post total hip replacement, left Active Problems:   Anemia in chronic kidney disease   COPD (chronic obstructive pulmonary disease) (HCC)   CAD in native artery   Benign essential HTN   Acid reflux   Type II diabetes mellitus with renal manifestations ([place])   ESRD on dialysis ([place])  Estimated body mass index is 30.49 kg/m as calculated from the following:   Height as of this encounter: 5' 6"  (1.676 m).   Weight as of this encounter: 85.7 kg.  Dressing intact to the left hip. NG tube in place, nasal cannula on. No acute changes to the left hip. Continue to monitor  progress. Begin PT when medical condition improves.  Raquel Dyllon Henken, PA-C [facility] 09/10/2019, 9:16 AM

## 2019-09-10 NOTE — Consult Note (Signed)
Pharmacy Antibiotic Note  Barbara [name] is a 79 y.o. female admitted on 09/05/2019 with aspiration pneumonia. Patient in hospital for left total hip arthroplasty and subsequently transferred to ICU on 0/22 for metabolic acidosis, encephalopathy, and severe respiratory distress requiring intubation. She was extubated 2/19. She has a history of ESRD on HD. Pharmacy has been consulted for ampicillin/sulbactam dosing for aspiration pneumonia.  Patient is afebrile. WBC elevated to 18.3. She is on high dose IV steroids.   Plan: Ampicillin/sulbactam 3 g q24h scheduled in the evening as to be given after dialysis on dialysis days    Height: 5' 6"  (167.6 cm) Weight: 188 lb 15 oz (85.7 kg) IBW/kg (Calculated) : 59.3  Temp (24hrs), Avg:98.5 F (36.9 C), Min:97.1 F (36.2 C), Max:100 F (37.8 C)  Recent Labs  Lab 09/05/19 0647 09/05/19 1435 09/06/19 0532 09/06/19 0532 09/07/19 0529 09/07/19 0529 09/07/19 1655 09/08/19 0547 09/08/19 1601 09/09/19 0433 09/10/19 0420  WBC  --  9.8 8.1  --  11.9*  --   --   --   --  18.3* 12.9*  CREATININE   < >  --  10.14*   < > 10.09*   < > 10.66* 7.20*  7.16* 4.31* 3.45* 4.62*   < > = values in this interval not displayed.    Estimated Creatinine Clearance: 10.9 mL/min (A) (by C-G formula based on SCr of 4.62 mg/dL (H)).    Allergies  Allergen Reactions  . Glipizide Er Other (See Comments)    Hypoglycemia   . Percocet [Oxycodone-Acetaminophen] Hives    Antimicrobials this admission: Cefazolin for surgical prophylaxis Ampicillin/sulbactam 2/20 >>   Dose adjustments this admission: n/a  Microbiology results: 2/17 MRSA PCR: Positive  Thank you for allowing pharmacy to be a part of this patient's care.  Chinita Greenland PharmD Clinical Pharmacist 09/10/2019

## 2019-09-10 NOTE — Progress Notes (Signed)
Pt opens eyes to voice. Oriented to self however unable to respond to other orientation questions. Left hip dressing CDI. Pt unable to respond to commands to complete neurological assessment effectively. Does not squeeze hands or flex or dorsiflex feet when asked. Pt able to open mouth for oral care and follow commands to close mouth for suction. Pt VSS.

## 2019-09-10 NOTE — Progress Notes (Signed)
Attempted to give report to 1A for patient transfer. Nurse unavailable.

## 2019-09-10 NOTE — Progress Notes (Signed)
[facility]  ROUNDING NOTE   Subjective:   Weaned off CRRT.   Off vasopressors.   Opening eyes to verbal stimuli  Objective:  Vital signs in last 24 hours:  Temp:  [97.1 F (36.2 C)-100 F (37.8 C)] 98.9 F (37.2 C) (02/21 0900) Pulse Rate:  [81-96] 89 (02/21 0900) Resp:  [13-23] 23 (02/21 0900) BP: (103-143)/(40-63) 142/53 (02/21 0900) SpO2:  [93 %-98 %] 96 % (02/21 0900) Weight:  [85.7 kg] 85.7 kg (02/21 0344)  Weight change: 2 kg Filed Weights   09/07/19 1600 09/09/19 0500 09/10/19 0344  Weight: 84.9 kg 83.7 kg 85.7 kg    Intake/Output: I/O last 3 completed shifts: In: 196.5 [I.V.:161.5; IV Piggyback:35] Out: 245 [Other:245]   Intake/Output this shift:  No intake/output data recorded.  Physical Exam: General: ill-appearing  Head: [place]/AT, NGT  Eyes: Anicteric  Neck: Supple, trachea midline  Lungs:  Diminished bilaterally 2L O2 [place]  Heart: tachycardia  Abdomen:  Obese, soft  Extremities: trace peripheral edema.  Neurologic: Moving all four extremities, withdraws to pain.   Skin: No lesions  Access: Left upper extremity AV graft, femoral dialysis catheter    Basic Metabolic Panel: Recent Labs  Lab 09/06/19 0532 09/06/19 1752 09/07/19 0529 09/07/19 0529 09/07/19 1105 09/07/19 1655 09/07/19 1655 09/08/19 0547 09/08/19 0547 09/08/19 1601 09/09/19 0433 09/10/19 0420  NA   < >  --  136   < >  --  134*  --  135  134*  --  135 135 136  K   < >  --  2.9*   < > 4.2 3.3*  --  3.7  3.7  --  4.0 4.9 4.6  CL   < >  --  95*   < >  --  91*  --  95*  94*  --  96* 99 100  CO2   < >  --  25   < >  --  23  --  29  28  --  30 25 24   GLUCOSE   < >  --  122*   < >  --  133*  --  127*  128*  --  137* 115* 115*  BUN   < >  --  49*   < >  --  58*  --  43*  47*  --  30* 24* 49*  CREATININE   < >  --  10.09*   < >  --  10.66*  --  7.20*  7.16*  --  4.31* 3.45* 4.62*  CALCIUM   < >  --  7.4*   < >  --  7.5*   < > 7.7*  7.7*   < > 7.6* 8.2* 8.2*  MG   --  2.0 1.8  --  2.3  --   --  2.3  --   --  2.5*  --   PHOS   < >  --  8.9*  --   --  9.1*  --  6.4*  6.5*  --  3.6 3.6  --    < > = values in this interval not displayed.    Liver Function Tests: Recent Labs  Lab 09/07/19 1655 09/08/19 0547 09/08/19 1601 09/09/19 0433  ALBUMIN 2.8* 2.4* 2.5* 2.4*   No results for input(s): LIPASE, AMYLASE in the last 168 hours. No results for input(s): AMMONIA in the last 168 hours.  CBC: Recent Labs  Lab 09/05/19 1435 09/06/19 0532 09/07/19  0034 09/09/19 0433 09/10/19 0420  WBC 9.8 8.1 11.9* 18.3* 12.9*  NEUTROABS  --   --   --  17.0*  --   HGB 11.6* 10.5* 10.9* 10.9* 8.7*  HCT 37.0 34.2* 34.5* 35.0* 27.8*  MCV 99.7 100.3* 100.0 100.9* 100.0  PLT 146* 132* 120* 120* 137*    Cardiac Enzymes: No results for input(s): CKTOTAL, CKMB, CKMBINDEX, TROPONINI in the last 168 hours.  BNP: Invalid input(s): POCBNP  CBG: Recent Labs  Lab 09/09/19 1558 09/09/19 1935 09/09/19 2309 09/10/19 0342 09/10/19 0738  GLUCAP 122* 106* 117* 107* 110*    Microbiology: Results for orders placed or performed during the hospital encounter of 09/05/19  MRSA PCR Screening     Status: Abnormal   Collection Time: 09/06/19  7:50 AM   Specimen: Nasal Mucosa; Nasopharyngeal  Result Value Ref Range Status   MRSA by PCR POSITIVE (A) NEGATIVE Final    Comment:        The GeneXpert MRSA Assay (FDA approved for NASAL specimens only), is one component of a comprehensive MRSA colonization surveillance program. It is not intended to diagnose MRSA infection nor to guide or monitor treatment for MRSA infections. RESULT CALLED TO, READ BACK BY AND VERIFIED WITH: MEGAN SHEFFIELD AT 0931 ON 09/06/2019 [place]. Performed at [facility], [address] [place], [place] [address]     Coagulation Studies: No results for input(s): LABPROT, INR in the last 72 hours.  Urinalysis: No results for input(s): COLORURINE, LABSPEC, PHURINE, GLUCOSEU, HGBUR,  BILIRUBINUR, KETONESUR, PROTEINUR, UROBILINOGEN, NITRITE, LEUKOCYTESUR in the last 72 hours.  Invalid input(s): APPERANCEUR    Imaging: DG Abd 1 View  Result Date: 09/09/2019 CLINICAL DATA:  NG tube placement. EXAM: ABDOMEN - 1 VIEW COMPARISON:  09/06/2019 FINDINGS: Nasogastric tube has been advanced with tip over the stomach in the left mid abdomen. Visualized bowel gas pattern is nonobstructive. No free peritoneal air. Partially visualized stent in the region of the left brachiocephalic vein. Remainder of the exam is unchanged. IMPRESSION: Interval advancement of nasogastric tube with tip over the stomach in the left mid abdomen. Nonobstructive bowel gas pattern. Electronically Signed   By: Marin Olp M.D.   On: 09/09/2019 09:32     Medications:   . sodium chloride Stopped (09/06/19 0721)  . sodium chloride    . sodium chloride    . sodium chloride    . ampicillin-sulbactam (UNASYN) IV Stopped (09/09/19 2052)  . lacosamide (VIMPAT) IV 100 mg (09/10/19 0904)  . phenylephrine (NEO-SYNEPHRINE) Adult infusion Stopped (09/09/19 1100)   . ascorbic acid  500 mg Oral BID  . atorvastatin  40 mg Oral QPM  . atropine  1 mg Intravenous Once  . B-complex with vitamin C  1 tablet Per Tube QHS  . Chlorhexidine Gluconate Cloth  6 each Topical Q0600  . cholecalciferol  1,000 Units Oral Daily  . dextrose  1 ampule Intravenous Once  . docusate  100 mg Oral Daily  . ferrous sulfate  325 mg Oral QPC breakfast  . heparin injection (subcutaneous)  5,000 Units Subcutaneous Q8H  . mouth rinse  15 mL Mouth Rinse BID  . midodrine  5 mg Per Tube TID  . pantoprazole sodium  40 mg Per Tube Daily   sodium chloride, sodium chloride, acetaminophen, bisacodyl, hydrALAZINE, ipratropium-albuterol, lidocaine (PF), lidocaine-prilocaine, magnesium hydroxide, metoCLOPramide **OR** metoCLOPramide (REGLAN) injection, ondansetron **OR** ondansetron (ZOFRAN) IV, pentafluoroprop-tetrafluoroeth  Assessment/ Plan:  79  y.o. female  Barbara [name] is a 79 y.o.  black female with end stage renal disease on hemodialysis, hypertension, diabetes mellitus type II, diabetic neuropathy, seizure disorder who was admitted for left hip repair and unfortunate developed seizure episode postoperatively and subsequently intubated. Required vasopressors and placed on CRRT.   [facility] MWF [facility] LUE AVG 78.5kg   1.  ESRD on HD MWF.  - Next scheduled intermittent hemodialysis treatment for Monday.   2.  Acute respiratory failure: intubated for airway protection. Extubated.  - nasal canula  3.  Anemia of chronic kidney disease.  Hemoglobin 8.7 - EPO with HD treatment  4.  Secondary hyperparathyroidism.   - holding calcium acetate, calcium carbonate, cinacalcet until able to take PO.   5. Hypotension: 142/53 - placed on midodrine. Holding home blood pressure regimen of losartan and furosemide.  - discontinue midodrine.    LOS: 5 Barbara [name] 2/21/202110:04 AM

## 2019-09-10 NOTE — Progress Notes (Signed)
[facility] for Electrolyte Monitoring and Replacement   Recent Labs: Potassium (mmol/L)  Date Value  09/10/2019 4.6  10/30/2014 3.2 (L)   Magnesium (mg/dL)  Date Value  09/09/2019 2.5 (H)  09/20/2011 1.9   Calcium (mg/dL)  Date Value  09/10/2019 8.2 (L)   Calcium, Total (mg/dL)  Date Value  08/07/2012 9.1   Albumin (g/dL)  Date Value  09/09/2019 2.4 (L)  08/02/2012 3.9   Phosphorus (mg/dL)  Date Value  09/09/2019 3.6   Sodium (mmol/L)  Date Value  09/10/2019 136  11/04/2016 138  08/07/2012 138     Assessment: 79 year old female s/p left total hip arthroplasty. Patient admitted to ICU post rapid response where patient was found to be unresponsive and required bipap. Noted to have seizure-like activity while receiving dialysis and was subsequently intubated for inability to protect airway. Pt currently on tube feed. Pharmacy consulted to manage electrolytes.  Goal of Therapy:  Electrolytes WNL  Plan:  K 4.6  Scr 4.62 (HD pt)  No additional repletion is needed at this time.  Will continue to follow and manage electrolytes.  Chinita Greenland PharmD Clinical Pharmacist 09/10/2019

## 2019-09-10 NOTE — Progress Notes (Addendum)
Gave report to floor RN Herma Ard. Pt in stable, no changes upon transfer.

## 2019-09-11 ENCOUNTER — Encounter: Payer: Self-pay | Admitting: Surgery

## 2019-09-11 DIAGNOSIS — J690 Pneumonitis due to inhalation of food and vomit: Secondary | ICD-10-CM | POA: Clinically undetermined

## 2019-09-11 LAB — RENAL FUNCTION PANEL
Albumin: 2.3 g/dL — ABNORMAL LOW (ref 3.5–5.0)
Anion gap: 15 (ref 5–15)
BUN: 89 mg/dL — ABNORMAL HIGH (ref 8–23)
CO2: 21 mmol/L — ABNORMAL LOW (ref 22–32)
Calcium: 8.2 mg/dL — ABNORMAL LOW (ref 8.9–10.3)
Chloride: 101 mmol/L (ref 98–111)
Creatinine, Ser: 7.36 mg/dL — ABNORMAL HIGH (ref 0.44–1.00)
GFR calc Af Amer: 6 mL/min — ABNORMAL LOW (ref >60)
GFR calc non Af Amer: 5 mL/min — ABNORMAL LOW (ref >60)
Glucose, Bld: 101 mg/dL — ABNORMAL HIGH (ref 70–99)
Phosphorus: 4.4 mg/dL (ref 2.5–4.6)
Potassium: 4 mmol/L (ref 3.5–5.1)
Sodium: 137 mmol/L (ref 135–145)

## 2019-09-11 LAB — CBC
HCT: 27.4 % — ABNORMAL LOW (ref 36.0–46.0)
Hemoglobin: 8.7 g/dL — ABNORMAL LOW (ref 12.0–15.0)
MCH: 31.2 pg (ref 26.0–34.0)
MCHC: 31.8 g/dL (ref 30.0–36.0)
MCV: 98.2 fL (ref 80.0–100.0)
Platelets: 189 10*3/uL (ref 150–400)
RBC: 2.79 MIL/uL — ABNORMAL LOW (ref 3.87–5.11)
RDW: 14.2 % (ref 11.5–15.5)
WBC: 14 10*3/uL — ABNORMAL HIGH (ref 4.0–10.5)
nRBC: 0 % (ref 0.0–0.2)

## 2019-09-11 LAB — GLUCOSE, CAPILLARY
Glucose-Capillary: 110 mg/dL — ABNORMAL HIGH (ref 70–99)
Glucose-Capillary: 119 mg/dL — ABNORMAL HIGH (ref 70–99)

## 2019-09-11 MED ORDER — SODIUM CHLORIDE 0.9 % IV SOLN
3.0000 g | Freq: Two times a day (BID) | INTRAVENOUS | Status: DC
  Administered 2019-09-11 – 2019-09-13 (×4): 3 g via INTRAVENOUS
  Filled 2019-09-11: qty 3
  Filled 2019-09-11: qty 8
  Filled 2019-09-11: qty 3
  Filled 2019-09-11 (×4): qty 8

## 2019-09-11 MED ORDER — FREE WATER
30.0000 mL | Status: DC
  Administered 2019-09-11 – 2019-09-13 (×10): 30 mL

## 2019-09-11 MED ORDER — EPOETIN ALFA 10000 UNIT/ML IJ SOLN
10000.0000 [IU] | INTRAMUSCULAR | Status: DC
  Administered 2019-09-11 – 2019-09-22 (×5): 10000 [IU] via INTRAVENOUS
  Filled 2019-09-11 (×6): qty 1

## 2019-09-11 MED ORDER — NEPRO/CARBSTEADY PO LIQD
1000.0000 mL | ORAL | Status: DC
  Administered 2019-09-11 – 2019-09-13 (×3): 1000 mL

## 2019-09-11 MED ORDER — SENNOSIDES 8.8 MG/5ML PO SYRP
5.0000 mL | ORAL_SOLUTION | Freq: Every day | ORAL | Status: DC
  Administered 2019-09-11 – 2019-09-12 (×2): 5 mL
  Filled 2019-09-11 (×3): qty 5

## 2019-09-11 MED ORDER — PRO-STAT SUGAR FREE PO LIQD
30.0000 mL | Freq: Two times a day (BID) | ORAL | Status: DC
  Administered 2019-09-12 (×2): 30 mL

## 2019-09-11 NOTE — TOC Initial Note (Signed)
Transition of Care [facility]) - Initial/Assessment Note    Patient Details  Name: Barbara [name] MRN: [id number] Date of Birth: [birth year]  Transition of Care [facility]) CM/SW Contact:    Elease Hashimoto, LCSW Phone Number: 09/11/2019, 3:26 PM  Clinical Narrative:  Met with husband who was waiting for pt to return from HD. He reports she was doing well then she wasn't and he wonders what happened. He thinks maybe she had a stroke. Discussed her function prior to admission. He reports she was independent with a walker and took the [place] to dialysis M, W,F. He helped with the home management but she did her own self care and was mobile. She has two daughter's who are local and are involved. Husband seems to be lost and looking for answers. Discussed may need to go to rehab before going home with husband. He seems to not be able to provide much assist to her. He does drive and takes her to MD appointments. Will follow and work on the safest discharge plan for both of them.                 Expected Discharge Plan: Skilled Nursing Facility Barriers to Discharge: Continued Medical Work up   Patient Goals and CMS Choice Patient states their goals for this hospitalization and ongoing recovery are:: Husband answers for pt I don't know what happen she was doing well then she wasn't. Did she have a stroke?      Expected Discharge Plan and Services Expected Discharge Plan: [place] In-house Referral: Clinical Social Work     Living arrangements for the past 2 months: Single Family Home                                      Prior Living Arrangements/Services Living arrangements for the past 2 months: Single Family Home Lives with:: Spouse          Need for Family Participation in Patient Care: Yes (Comment) Care giver support system in place?: No (comment) Current home services: DME(rw)    Activities of Daily Living Home Assistive Devices/Equipment: Blood pressure cuff,  Dentures (specify type), Cane (specify quad or straight), Walker (specify type), Wheelchair, CPAP, Oxygen ADL Screening (condition at time of admission) Patient's cognitive ability adequate to safely complete daily activities?: Yes Is the patient deaf or have difficulty hearing?: No Does the patient have difficulty seeing, even when wearing glasses/contacts?: No Does the patient have difficulty concentrating, remembering, or making decisions?: No Patient able to express need for assistance with ADLs?: Yes Does the patient have difficulty dressing or bathing?: No Independently performs ADLs?: Yes (appropriate for developmental age) Does the patient have difficulty walking or climbing stairs?: Yes Weakness of Legs: Both Weakness of Arms/Hands: None  Permission Sought/Granted                  Emotional Assessment Appearance:: Appears stated age Attitude/Demeanor/Rapport: Other (comment)(lethargic) Affect (typically observed): Unable to Assess Orientation: : Oriented to Self, Oriented to Place      Admission diagnosis:  Status post total hip replacement, left [V03.500] Patient Active Problem List   Diagnosis Date Noted  . Aspiration pneumonia ([place]) 09/11/2019  . Status post total hip replacement, left 09/05/2019  . Type II diabetes mellitus with renal manifestations ([place]) 09/05/2019  . ESRD on dialysis ([place]) 09/05/2019  . Bacteremia due to other bacteria   . Bacteroides  fragilis infection   . Acute metabolic encephalopathy   . Somnolence   . Lobar pneumonia ([place]) 05/11/2019  . Dialysis patient ([place])   . Primary osteoarthritis of left hip 04/07/2019  . Metabolic acidosis 58/30/9407  . Complication from renal dialysis device 01/28/2018  . History of colonic polyps 07/30/2017  . Presence of cardiac and vascular implant and graft   . Fever   . Sepsis ([place])   . ESRD (end stage renal disease) ([place]) 02/01/2017  . Sepsis secondary to UTI ([place]) 01/24/2017  . Acute kidney injury  ([place]) 11/19/2016  . Anemia, chronic renal failure, stage 4 (severe) ([place]) 09/17/2016  . Hypoglycemia 06/04/2016  . Hyperkalemia 05/12/2016  . Protein-calorie malnutrition, severe 05/05/2016  . Acute on chronic renal failure ([place]) 05/04/2016  . Seizure ([place]) 04/17/2016  . Palliative care by specialist   . DNR (do not resuscitate) discussion   . C. difficile diarrhea 03/11/2016  . Anemia 03/11/2016  . Hypotension 03/11/2016  . Acidosis 03/11/2016  . Failure to thrive (child) 03/11/2016  . Weakness generalized 03/11/2016  . ARF (acute renal failure) ([place]) 03/04/2016  . Cancer of trigone of urinary bladder ([place]) 03/02/2016  . Absolute anemia 02/04/2016  . Airway hyperreactivity 02/04/2016  . Celiac disease 02/04/2016  . Gastric catarrh 02/04/2016  . Acid reflux 02/04/2016  . Combined fat and carbohydrate induced hyperlipemia 02/04/2016  . C. difficile colitis 01/22/2016  . Urinary obstruction 01/21/2016  . COPD (chronic obstructive pulmonary disease) ([place]) 01/21/2016  . Controlled type 2 diabetes mellitus with stage 4 chronic kidney disease, with long-term current use of insulin ([place]) 01/21/2016  . Essential hypertension 01/21/2016  . Acute renal failure superimposed on stage 4 chronic kidney disease ([place]) 01/20/2016  . Anemia in chronic kidney disease 01/20/2016  . Arthritis of knee, degenerative 05/10/2015  . Knee strain 03/26/2015  . Other intervertebral disc displacement, lumbar region 03/04/2015  . Degenerative arthritis of lumbar spine 03/04/2015  . HNP (herniated nucleus pulposus), lumbar 03/04/2015  . Osteoarthritis of spine with radiculopathy, lumbar region 03/04/2015  . Injury of tendon of upper extremity 11/12/2014  . Atherosclerosis of abdominal aorta ([place]) 11/02/2014  . Chronic kidney disease, stage IV (severe) ([place]) 11/02/2014  . Obstructive apnea 11/02/2014  . Complete rotator cuff rupture of left shoulder 10/05/2014  . Arthritis of shoulder region, degenerative  10/05/2014  . CAD in native artery 12/08/2013  . Benign essential HTN 12/08/2013  . Type 2 diabetes mellitus ([place]) 12/08/2013   PCP:  [name], MD Pharmacy:   [id number] [place], [id number]: [phone]: [phone]    Social Determinants of Health (SDOH) Interventions    Readmission Risk Interventions No flowsheet data found.

## 2019-09-11 NOTE — Care Management Important Message (Signed)
Important Message  Patient Details  Name: Barbara [name] MRN: [id number] Date of Birth: May 21, 1941   Medicare Important Message Given:  Yes     [name] 09/11/2019, 11:35 AM

## 2019-09-11 NOTE — Consult Note (Signed)
Pharmacy Antibiotic Note  Barbara [name] is a 79 y.o. female admitted on 09/05/2019 with aspiration pneumonia. Patient in hospital for left total hip arthroplasty and subsequently transferred to ICU on 1/50 for metabolic acidosis, encephalopathy, and severe respiratory distress requiring intubation. She was extubated 2/19  She has a history of ESRD on HD MWF. Pharmacy has been consulted for ampicillin/sulbactam dosing for aspiration pneumonia..   Plan: Ampicillin/sulbactam 3g IV every 12 hours    Height: 5' 6"  (167.6 cm) Weight: 188 lb 15 oz (85.7 kg) IBW/kg (Calculated) : 59.3  Temp (24hrs), Avg:99 F (37.2 C), Min:98 F (36.7 C), Max:100.2 F (37.9 C)  Recent Labs  Lab 09/05/19 0647 09/05/19 1435 09/06/19 0532 09/06/19 0532 09/07/19 0529 09/07/19 0529 09/07/19 1655 09/08/19 0547 09/08/19 1601 09/09/19 0433 09/10/19 0420  WBC  --  9.8 8.1  --  11.9*  --   --   --   --  18.3* 12.9*  CREATININE   < >  --  10.14*   < > 10.09*   < > 10.66* 7.20*  7.16* 4.31* 3.45* 4.62*   < > = values in this interval not displayed.    Estimated Creatinine Clearance: 10.9 mL/min (A) (by C-G formula based on SCr of 4.62 mg/dL (H)).    Allergies  Allergen Reactions  . Glipizide Er Other (See Comments)    Hypoglycemia   . Percocet [Oxycodone-Acetaminophen] Hives    Antimicrobials this admission: Cefazolin for surgical prophylaxis Ampicillin/sulbactam 2/20 >>    Microbiology results: 2/17 MRSA PCR: Positive  Thank you for allowing pharmacy to be a part of this patient's care.  Pernell Dupre, PharmD, BCPS Clinical Pharmacist 09/11/2019 8:34 AM

## 2019-09-11 NOTE — Progress Notes (Signed)
Nutrition Follow-up  DOCUMENTATION CODES:   Not applicable  INTERVENTION:  Initiate nocturnal tube feeds of Nepro at 70 mL/hr x 14 hrs (1800-0800) + Pro-Stat 30 mL BID per tube. Provides 1964 kcal, 109 grams of protein, 715 mL H2O daily.  Provide minimum free water flush of 30 mL Q4hrs to maintain tube patency.  Continue B-complex with C QHS per tube.  NUTRITION DIAGNOSIS:   Inadequate oral intake related to inability to eat as evidenced by NPO status.  Ongoing.  GOAL:   Patient will meet greater than or equal to 90% of their needs  Progressing.  MONITOR:   Diet advancement, Labs, Weight trends, TF tolerance, I & O's  REASON FOR ASSESSMENT:   Malnutrition Screening Tool, Ventilator, Consult Enteral/tube feeding initiation and management  ASSESSMENT:   78 year old female with PMHx of COPD, HTN, GERD, CAD, asthma, celiac disease, ESRD on HD, DM, bladder cancer s/p TURBT followed by palliative gemcitabine and concurrent radiation. Patient was admitted for left total hip arthroplasty on 2/16 but on 2/17 developed respiratory distress, metabolic acidosis, and encephalopathy requiring intubation.  Patient was extubated on 2/19. She was not able to have diet advanced and NGT was placed on 2/20 in right nare. RD received consult to initiate tube feeds. Plan is also for SLP evaluation today. Recommend keeping NGT in place and having patient receive nocturnal feeds at this time so her appetite can be stimulated during the day. Met with patient and her husband in her room after she returned from HD. Patient was confused today.  Medications reviewed and include: vitamin C 500 mg BID, B-complex with C 1 tablet QHS, vitamin D3 1000 units daily, Colace 100 mg daily, Epogen during HD, ferrous sulfate 325 mg daily, pantoprazole, sennosides, Unasyn, Vimpat.  Labs reviewed: CBG 96-121, CO2 21, BUN 89, Creatinine 7.36.  Enteral Access: 16 Fr. NGT  Diet Order:   Diet Order            Diet  NPO time specified Except for: Other (See Comments)  Diet effective now             EDUCATION NEEDS:   Not appropriate for education at this time  Skin:  Skin Assessment: Skin Integrity Issues:(closed incision left hip)  Last BM:  09/10/2019 medium type 6  Height:   Ht Readings from Last 1 Encounters:  09/06/19 5' 6" (1.676 m)   Weight:   Wt Readings from Last 1 Encounters:  09/10/19 85.7 kg   Ideal Body Weight:  59.1 kg  BMI:  Body mass index is 30.49 kg/m.  Estimated Nutritional Needs:   Kcal:  1700-1900  Protein:  95-110 grams  Fluid:  1.8 L/day   King, MS, RD, LDN Pager number available on Amion 

## 2019-09-11 NOTE — Progress Notes (Signed)
[facility]  ROUNDING NOTE   Subjective:   Seen and examined on hemodialysis treatment. Tolerating treatment well.  Some confusion today.     HEMODIALYSIS FLOWSHEET:  Blood Flow Rate (mL/min): 350 mL/min Arterial Pressure (mmHg): -190 mmHg Venous Pressure (mmHg): 170 mmHg Transmembrane Pressure (mmHg): 50 mmHg Ultrafiltration Rate (mL/min): 680 mL/min Dialysate Flow Rate (mL/min): 600 ml/min Conductivity: Machine : 14.3 Conductivity: Machine : 14.3 Dialysis Fluid Bolus: Normal Saline Bolus Amount (mL): 250 mL Dialysate Change: (3K)    Objective:  Vital signs in last 24 hours:  Temp:  [97.8 F (36.6 C)-99 F (37.2 C)] 97.8 F (36.6 C) (02/22 0947) Pulse Rate:  [80-92] 85 (02/22 1145) Resp:  [16-21] 21 (02/22 1015) BP: (125-161)/(51-104) 141/59 (02/22 1145) SpO2:  [93 %-100 %] 100 % (02/22 0947)  Weight change:  Filed Weights   09/07/19 1600 09/09/19 0500 09/10/19 0344  Weight: 84.9 kg 83.7 kg 85.7 kg    Intake/Output: I/O last 3 completed shifts: In: 301 [IV Piggyback:301] Out: 0    Intake/Output this shift:  No intake/output data recorded.  Physical Exam: General: Laying in bed  Head: [place]/AT  Eyes: Anicteric  Neck: Supple, trachea midline  Lungs:  Diminished bilaterally   Heart: regular  Abdomen:  Obese, soft  Extremities: trace peripheral edema.  Neurologic: Not alert or oriented.   Skin: No lesions  Access: Left upper extremity AV graft, femoral dialysis temporary catheter    Basic Metabolic Panel: Recent Labs  Lab 09/06/19 0532 09/06/19 1752 09/07/19 0529 09/07/19 0529 09/07/19 1105 09/07/19 1655 09/07/19 1655 09/08/19 0547 09/08/19 0547 09/08/19 1601 09/08/19 1601 09/09/19 0433 09/10/19 0420 09/11/19 0832  NA   < >  --  136   < >  --  134*   < > 135  134*  --  135  --  135 136 137  K   < >  --  2.9*   < > 4.2 3.3*   < > 3.7  3.7  --  4.0  --  4.9 4.6 4.0  CL   < >  --  95*   < >  --  91*   < > 95*  94*  --  96*  --   99 100 101  CO2   < >  --  25   < >  --  23   < > 29  28  --  30  --  25 24 21*  GLUCOSE   < >  --  122*   < >  --  133*   < > 127*  128*  --  137*  --  115* 115* 101*  BUN   < >  --  49*   < >  --  58*   < > 43*  47*  --  30*  --  24* 49* 89*  CREATININE   < >  --  10.09*   < >  --  10.66*   < > 7.20*  7.16*  --  4.31*  --  3.45* 4.62* 7.36*  CALCIUM   < >  --  7.4*   < >  --  7.5*   < > 7.7*  7.7*   < > 7.6*   < > 8.2* 8.2* 8.2*  MG  --  2.0 1.8  --  2.3  --   --  2.3  --   --   --  2.5*  --   --   PHOS   < >  --  8.9*   < >  --  9.1*  --  6.4*  6.5*  --  3.6  --  3.6  --  4.4   < > = values in this interval not displayed.    Liver Function Tests: Recent Labs  Lab 09/07/19 1655 09/08/19 0547 09/08/19 1601 09/09/19 0433 09/11/19 0832  ALBUMIN 2.8* 2.4* 2.5* 2.4* 2.3*   No results for input(s): LIPASE, AMYLASE in the last 168 hours. No results for input(s): AMMONIA in the last 168 hours.  CBC: Recent Labs  Lab 09/06/19 0532 09/07/19 0529 09/09/19 0433 09/10/19 0420 09/11/19 0832  WBC 8.1 11.9* 18.3* 12.9* 14.0*  NEUTROABS  --   --  17.0*  --   --   HGB 10.5* 10.9* 10.9* 8.7* 8.7*  HCT 34.2* 34.5* 35.0* 27.8* 27.4*  MCV 100.3* 100.0 100.9* 100.0 98.2  PLT 132* 120* 120* 137* 189    Cardiac Enzymes: No results for input(s): CKTOTAL, CKMB, CKMBINDEX, TROPONINI in the last 168 hours.  BNP: Invalid input(s): POCBNP  CBG: Recent Labs  Lab 09/10/19 1206 09/10/19 1627 09/10/19 2011 09/10/19 2331 09/11/19 0326  GLUCAP 107* 112* 96 121* 110*    Microbiology: Results for orders placed or performed during the hospital encounter of 09/05/19  MRSA PCR Screening     Status: Abnormal   Collection Time: 09/06/19  7:50 AM   Specimen: Nasal Mucosa; Nasopharyngeal  Result Value Ref Range Status   MRSA by PCR POSITIVE (A) NEGATIVE Final    Comment:        The GeneXpert MRSA Assay (FDA approved for NASAL specimens only), is one component of a comprehensive MRSA  colonization surveillance program. It is not intended to diagnose MRSA infection nor to guide or monitor treatment for MRSA infections. RESULT CALLED TO, READ BACK BY AND VERIFIED WITH: MEGAN SHEFFIELD AT 0931 ON 09/06/2019 [place]. Performed at [facility], [address] [place], [place] [address]     Coagulation Studies: No results for input(s): LABPROT, INR in the last 72 hours.  Urinalysis: No results for input(s): COLORURINE, LABSPEC, PHURINE, GLUCOSEU, HGBUR, BILIRUBINUR, KETONESUR, PROTEINUR, UROBILINOGEN, NITRITE, LEUKOCYTESUR in the last 72 hours.  Invalid input(s): APPERANCEUR    Imaging: No results found.   Medications:   . sodium chloride Stopped (09/06/19 0721)  . sodium chloride    . sodium chloride    . sodium chloride    . ampicillin-sulbactam (UNASYN) IV    . lacosamide (VIMPAT) IV Stopped (09/10/19 2320)   . ascorbic acid  500 mg Oral BID  . atorvastatin  40 mg Oral QPM  . atropine  1 mg Intravenous Once  . B-complex with vitamin C  1 tablet Per Tube QHS  . Chlorhexidine Gluconate Cloth  6 each Topical Q0600  . cholecalciferol  1,000 Units Oral Daily  . dextrose  1 ampule Intravenous Once  . docusate  100 mg Oral Daily  . epoetin (EPOGEN/PROCRIT) injection  10,000 Units Intravenous Q M,W,F-HD  . ferrous sulfate  325 mg Oral QPC breakfast  . heparin injection (subcutaneous)  5,000 Units Subcutaneous Q12H  . mouth rinse  15 mL Mouth Rinse BID  . pantoprazole sodium  40 mg Per Tube Daily  . sennosides  5 mL Per Tube Daily   sodium chloride, sodium chloride, acetaminophen, bisacodyl, hydrALAZINE, ipratropium-albuterol, lidocaine (PF), lidocaine-prilocaine, magnesium hydroxide, metoCLOPramide **OR** metoCLOPramide (REGLAN) injection, ondansetron **OR** ondansetron (ZOFRAN) IV, pentafluoroprop-tetrafluoroeth  Assessment/ Plan:    [name] is a 79 y.o. black female  with end stage renal disease on hemodialysis, hypertension, diabetes  mellitus type II, diabetic neuropathy, seizure disorder who was admitted for left hip repair and unfortunate developed seizure episode postoperatively and subsequently intubated. Required vasopressors and placed on CRRT.   [facility] MWF [facility] LUE AVG 78.5kg   1.  ESRD on HD MWF.  Seen and examined on hemodialysis treatment. Tolerating treatment well.   2.  Acute respiratory failure: intubated for airway protection. Extubated.  - nasal canula  3.  Anemia of chronic kidney disease.  Hemoglobin 8.7 - EPO with HD treatment  4.  Secondary hyperparathyroidism.   - holding calcium acetate, calcium carbonate, cinacalcet until able to take PO.   5. Hypotension: 141/59. Holding home blood pressure regimen of losartan and furosemide.    LOS: 6 [place] Pracht 2/22/202111:52 AM

## 2019-09-11 NOTE — Evaluation (Signed)
Clinical/Bedside Swallow Evaluation Patient Details  Name: Barbara [name] MRN: [id number] Date of Birth: 07-25-1940  Today's Date: 09/11/2019 Time: SLP Start Time (ACUTE ONLY): 41 SLP Stop Time (ACUTE ONLY): 1505 SLP Time Calculation (min) (ACUTE ONLY): 50 min  Past Medical History:  Past Medical History:  Diagnosis Date  . Adult celiac disease   . Anemia associated with chronic renal failure 2017   blood transfusion last week 10/17  . Arthritis   . Asthma   . Cancer ([place]) 2017   bladder  . Chronic kidney disease   . 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  dr. Candiss Norse and dr. Holley Raring follow her  . COPD (chronic obstructive pulmonary disease) ([place])   . Coronary artery disease   . Diabetes mellitus without complication ([place])   . Dialysis patient ([place])    Tues, Thurs, Sat  . Dyspnea    with exertion  . Elevated lipids   . GERD (gastroesophageal reflux disease)   . Hematuria   . Hypertension   . Lower back pain   . Neuropathy   . Oxygen dependent    at hs  . Personal history of chemotherapy 2017   bladder ca  . Personal history of radiation therapy 2017   bladder ca  . Sleep apnea    uses cpap  . Urinary obstruction 01/2016   Past Surgical History:  Past Surgical History:  Procedure Laterality Date  . A/V SHUNT INTERVENTION N/A 05/12/2018   Procedure: A/V SHUNT INTERVENTION;  Surgeon: Algernon Huxley, MD;  Location: [facility];  Service: Cardiovascular;  Laterality: N/A;  . A/V SHUNT INTERVENTION Left 07/29/2018   Procedure: A/V SHUNT INTERVENTION;  Surgeon: Katha Cabal, MD;  Location: [facility];  Service: Cardiovascular;  Laterality: Left;  . ABDOMINAL HYSTERECTOMY    . AV FISTULA PLACEMENT Left 02/17/2017   Procedure: INSERTION OF ARTERIOVENOUS (AV) GORE-TEX GRAFT ARM(BRACHIAL AXILLARY);  Surgeon: Katha Cabal, MD;  Location: [facility];  Service: Vascular;  Laterality: Left;  . CYSTOSCOPY W/ RETROGRADES Bilateral 02/17/2016    Procedure: CYSTOSCOPY WITH RETROGRADE PYELOGRAM;  Surgeon: Hollice Espy, MD;  Location: [facility];  Service: Urology;  Laterality: Bilateral;  . CYSTOSCOPY W/ RETROGRADES Bilateral 10/12/2016   Procedure: CYSTOSCOPY WITH RETROGRADE PYELOGRAM;  Surgeon: Hollice Espy, MD;  Location: [facility];  Service: Urology;  Laterality: Bilateral;  . CYSTOSCOPY W/ RETROGRADES Bilateral 06/09/2017   Procedure: CYSTOSCOPY WITH RETROGRADE PYELOGRAM;  Surgeon: Hollice Espy, MD;  Location: [facility];  Service: Urology;  Laterality: Bilateral;  . CYSTOSCOPY W/ RETROGRADES Bilateral 10/07/2017   Procedure: CYSTOSCOPY WITH RETROGRADE PYELOGRAM;  Surgeon: Hollice Espy, MD;  Location: [facility];  Service: Urology;  Laterality: Bilateral;  . CYSTOSCOPY W/ URETERAL STENT PLACEMENT Left 05/12/2016   Procedure: CYSTOSCOPY WITH STENT REPLACEMENT;  Surgeon: Hollice Espy, MD;  Location: [facility];  Service: Urology;  Laterality: Left;  . CYSTOSCOPY W/ URETERAL STENT PLACEMENT Left 10/12/2016   Procedure: CYSTOSCOPY WITH STENT REPLACEMENT;  Surgeon: Hollice Espy, MD;  Location: [facility];  Service: Urology;  Laterality: Left;  . CYSTOSCOPY WITH BIOPSY N/A 06/09/2017   Procedure: CYSTOSCOPY WITH BLADDER BIOPSY;  Surgeon: Hollice Espy, MD;  Location: [facility];  Service: Urology;  Laterality: N/A;  . CYSTOSCOPY WITH STENT PLACEMENT Left 01/21/2016   Procedure: CYSTOSCOPY WITH double J STENT PLACEMENT;  Surgeon: Franchot Gallo, MD;  Location: [facility];  Service: Urology;  Laterality: Left;  . CYSTOSCOPY WITH STENT PLACEMENT Right 10/12/2016  Procedure: CYSTOSCOPY WITH STENT PLACEMENT;  Surgeon: Hollice Espy, MD;  Location: [facility];  Service: Urology;  Laterality: Right;  . DIALYSIS/PERMA CATHETER INSERTION N/A 11/20/2016   Procedure: Dialysis/Perma Catheter Insertion;  Surgeon: Algernon Huxley, MD;  Location: [facility];  Service: Cardiovascular;  Laterality: N/A;  . DIALYSIS/PERMA CATHETER REMOVAL N/A 03/30/2017    Procedure: DIALYSIS/PERMA CATHETER REMOVAL;  Surgeon: Katha Cabal, MD;  Location: [facility];  Service: Cardiovascular;  Laterality: N/A;  . KIDNEY SURGERY  01/21/2016   IR NEPHROSTOMY PLACEMENT LEFT   . PERIPHERAL VASCULAR CATHETERIZATION N/A 04/07/2016   Procedure: Glori Luis Cath Insertion;  Surgeon: Katha Cabal, MD;  Location: [facility];  Service: Cardiovascular;  Laterality: N/A;  . PERIPHERAL VASCULAR THROMBECTOMY Left 06/02/2017   Procedure: PERIPHERAL VASCULAR THROMBECTOMY;  Surgeon: Algernon Huxley, MD;  Location: [facility];  Service: Cardiovascular;  Laterality: Left;  . PERIPHERAL VASCULAR THROMBECTOMY Left 01/28/2018   Procedure: PERIPHERAL VASCULAR THROMBECTOMY;  Surgeon: Katha Cabal, MD;  Location: [facility];  Service: Cardiovascular;  Laterality: Left;  . PERIPHERAL VASCULAR THROMBECTOMY Left 03/14/2018   Procedure: PERIPHERAL VASCULAR THROMBECTOMY;  Surgeon: Algernon Huxley, MD;  Location: [facility];  Service: Cardiovascular;  Laterality: Left;  . PERIPHERAL VASCULAR THROMBECTOMY Left 03/16/2018   Procedure: PERIPHERAL VASCULAR THROMBECTOMY;  Surgeon: Katha Cabal, MD;  Location: [facility];  Service: Cardiovascular;  Laterality: Left;  . PORTA CATH REMOVAL N/A 02/07/2019   Procedure: PORTA CATH REMOVAL;  Surgeon: Katha Cabal, MD;  Location: [facility];  Service: Cardiovascular;  Laterality: N/A;  . PORTACATH PLACEMENT Right   . ROTATOR CUFF REPAIR Left   . TEMPORARY DIALYSIS CATHETER N/A 05/11/2018   Procedure: TEMPORARY DIALYSIS CATHETER;  Surgeon: Katha Cabal, MD;  Location: [facility];  Service: Cardiovascular;  Laterality: N/A;  . TEMPORARY DIALYSIS CATHETER Left 07/27/2018   Procedure: TEMPORARY DIALYSIS CATHETER;  Surgeon: Katha Cabal, MD;  Location: [facility];  Service: Cardiovascular;  Laterality: Left;  . TOTAL HIP ARTHROPLASTY Left 09/05/2019    Procedure: TOTAL HIP ARTHROPLASTY;  Surgeon: Corky Mull, MD;  Location: [facility];  Service: Orthopedics;  Laterality: Left;  . TRANSURETHRAL RESECTION OF BLADDER TUMOR N/A 02/17/2016   Procedure: TRANSURETHRAL RESECTION OF BLADDER TUMOR (TURBT)-LARGE;  Surgeon: Hollice Espy, MD;  Location: [facility];  Service: Urology;  Laterality: N/A;  . TRANSURETHRAL RESECTION OF BLADDER TUMOR N/A 10/07/2017   Procedure: TRANSURETHRAL RESECTION OF BLADDER TUMOR (TURBT)-small;  Surgeon: Hollice Espy, MD;  Location: [facility];  Service: Urology;  Laterality: N/A;  . URETEROSCOPY Left 02/17/2016   Procedure: URETEROSCOPY;  Surgeon: Hollice Espy, MD;  Location: [facility];  Service: Urology;  Laterality: Left;  . URETEROSCOPY Right 10/12/2016   Procedure: URETEROSCOPY;  Surgeon: Hollice Espy, MD;  Location: [facility];  Service: Urology;  Laterality: Right;   HPI:  Pt is an 79 y.o. female with medical history significant of hypertension, hyperlipidemia, diet-controlled diabetes, COPD, asthma, GERD, ESRD-HD (MWF), OSA, CAD, bladder cancer, iron deficiency anemia, pt is admitted by Dr. Kizzie Bane for s/p of left total hip arthroplasty.  During this admission, Transferred to ICU for severe resp distress, severe metabolic acidosis and encephalopathy. She failed BiPAP and was emergently orally intubated on 09/06/19-09/08/2019 post seizure-like activity noted.    Assessment / Plan / Recommendation Clinical Impression  Pt appears to present w/ oropharyngeal phase dysphagia impacted by Acute encephalopathy and confusion.  Pt also has an NG tube in place. Pt exhibited open-mouth posture and breathing at baseline; intermittent mumbled/muttered speech. She followed basic instruction w/ Mod+ cues. Pt presented w/ significant weakness. Due to presence of NG tube, only trials of liquids were given. Pt exhibited adequate toleration of single ice chips trials but d/t degreee of confusion, trials of Nectar liquids via TSP were then given. Pt  demonstrated adequate toleration w/ No overt clinical s/s of aspiration noted; clear vocal quality b/t trials and no decline in respiratory status occurred during/post trials. Pt's oral phase c/b min increased time and lingual smacking w/ Nectar trials. Pt accepted TSP trials followed by lingual smacking intermittently then returning to open-mouth posture and breathing. Noted min velopharyngeal incompetency; increased lingual movements -- impact from NG tube(?) Oral clearing followed each trial. Pt required full feeding support; verbal/tactile cues. OM exam revealed adequate lingual strength; no unilateral oral motor weakness. Recommend a clear liquid diet of Nectar consistency d/t concern for aspiration secondary to mental status decline currently. Recommend general aspiration precautions; Pills Crushed in Puree via NG. Feeding Support at meals. ST services will f/u w/ trials to upgrade diet to add in foods once NG can be removed. Dietician and NSG consulted re: this. Husband updated.  SLP Visit Diagnosis: Dysphagia, oropharyngeal phase (R13.12);Other (comment)(Cognitive decline)    Aspiration Risk  Moderate aspiration risk;Risk for inadequate nutrition/hydration    Diet Recommendation  Nectar consistency - clear liquid diet; aspiration precautions  Medication Administration: Via alternative means(NG)    Other  Recommendations Recommended Consults: (Dietician f/u) Oral Care Recommendations: Oral care BID;Oral care before and after PO;Staff/trained caregiver to provide oral care Other Recommendations: Order thickener from pharmacy;Prohibited food (jello, ice cream, thin soups);Remove water pitcher;Have oral suction available   Follow up Recommendations Skilled Nursing facility(TBD)      Frequency and Duration min 3x week  2 weeks       Prognosis Prognosis for Safe Diet Advancement: Fair(-Good) Barriers to Reach Goals: Cognitive deficits;Time post onset;Severity of deficits Barriers/Prognosis  Comment: NG present      Swallow Study   General Date of Onset: 09/05/19 HPI: Pt is an 79 y.o. female with medical history significant of hypertension, hyperlipidemia, diet-controlled diabetes, COPD, asthma, GERD, ESRD-HD (MWF), OSA, CAD, bladder cancer, iron deficiency anemia, pt is admitted by Dr. Kizzie Bane for s/p of left total hip arthroplasty.  During this admission, Transferred to ICU for severe resp distress, severe metabolic acidosis and encephalopathy. She failed BiPAP and was emergently orally intubated on 09/06/19-09/08/2019 post seizure-like activity noted.  Type of Study: Bedside Swallow Evaluation Previous Swallow Assessment: none present Diet Prior to this Study: NPO;NG Tube Temperature Spikes Noted: No(wbc 14.0) Respiratory Status: Nasal cannula(.5-2L) History of Recent Intubation: Yes Length of Intubations (days): 2 days Date extubated: 09/08/19 Behavior/Cognition: Alert;Cooperative;Pleasant mood;Confused;Distractible;Requires cueing Oral Cavity Assessment: Dry Oral Care Completed by SLP: Yes Oral Cavity - Dentition: Poor condition;Missing dentition(Denture/partial plate at home per husband) Vision: (n/a) Self-Feeding Abilities: Total assist Patient Positioning: Upright in bed(needed positioning) Baseline Vocal Quality: Low vocal intensity(mumbled/muttered speech; open mout posture) Volitional Cough: Cognitively unable to elicit Volitional Swallow: Unable to elicit    Oral/Motor/Sensory Function Overall Oral Motor/Sensory Function: Within functional limits(grossly w/ lingual movements; mouth breathing)   Ice Chips Ice chips: Within functional limits Presentation: Spoon(fed; 3 trials)   Thin Liquid Thin Liquid: Not tested    Nectar Thick Nectar Thick Liquid: Within functional limits Presentation: Spoon(fed; 10 trials) Other Comments: increased lingual smacking intermittently returning to open-mouth posture and  breathing. Noted min velopharyngeal incompetency -- impact from  NG tube(?)   Honey Thick Honey Thick Liquid: Not tested   Puree Puree: Not tested   Solid     Solid: Not tested        [name], MS, CCC-SLP Barbara [name] 09/11/2019,3:13 PM

## 2019-09-11 NOTE — Plan of Care (Signed)

## 2019-09-11 NOTE — Progress Notes (Addendum)
Progress Note    Barbara [name]  KKX:[id number] DOB: 23-May-1941  DOA: 09/05/2019 PCP: [name], MD      Brief Narrative:    Medical records reviewed and are as summarized below:  Barbara [name] is an 78 y.o. female with medical history significant of hypertension, hyperlipidemia, diet-controlled diabetes, COPD, asthma, GERD, ESRD-HD (MWF), OSA, CAD, bladder cancer, iron deficiency anemia.  She was admitted by Dr. Kizzie Bane for lefttotal hip arthroplasty.  She developed acute respiratory distress after surgery.  She was subsequently transferred to the ICU for acute hypoxemic and hypercapnic respiratory failure.  Initially, she was treated with BiPAP.  However, she failed BiPAP therapy and required intubation and mechanical ventilation in the ICU.  She was also treated with IV antibiotics for aspiration pneumonia.   She was also seen by the nephrologist for end-stage renal disease.  She was initially placed on CRRT and then transition to hemodialysis. She required vasopressors for hypotension.  She was seen by the neurologist for altered mental status and seizure activity.  MRI brain did not show any acute stroke.  Neurologist recommended Vimpat.  She was also seen by the cardiologist because of bradycardia and sinus pauses on telemetry.  Per cardiologist, there was no indication for permanent pacemaker and he recommended a conservative approach.  She was extubated on 09/08/2019.  Her condition was slowly improving so she was was transferred to the medical floor for continued medical management.   Assessment/Plan:   Principal Problem:   Status post total hip replacement, left Active Problems:   Anemia in chronic kidney disease   COPD (chronic obstructive pulmonary disease) ([facility])   CAD in native artery   Benign essential HTN   Acid reflux   Seizure ([facility])   Type II diabetes mellitus with renal manifestations ([facility])   ESRD on dialysis ([place])   Aspiration pneumonia  ([place])   Acute hypoxemic and hypercapnic respiratory failure: Resolved.  Extubated on 09/08/2019.  She is on 2 L/min oxygen via nasal cannula.Taper off oxygen as able.  Aspiration pneumonia: Continue IV Unasyn.  Acute toxic metabolic encephalopathy: Continue supportive care.  Chart review shows that patient has had similar episodes of encephalopathy current several days to recover.  Seizures: Continue Vimpat  ESRD on hemodialysis: Follow-up with nephrologist for hemodialysis. Schedule days are Mondays, Wednesdays and Fridays.  Chronic diastolic CHF: Hemodialysis for fluid management.  2D echo on 09/07/2019 showed EF estimated at 60 to 16%, grade 1 diastolic dysfunction.  Dysphagia: Consult dietitian for enteral nutrition.  Consult speech therapist for swallow evaluation.  Leukocytosis and thrombocytopenia: Improving.  Bradycardia: Improved  Suspected ileus: Resolved  MRSA colonization of nares: Completed Bactroban    Body mass index is 30.49 kg/m.  (Obesity)   Family Communication/Anticipated D/C date and plan/Code Status   DVT prophylaxis: Heparin Code Status: DNR Family Communication: Plan discussed with her daughter, [name] over the phone Disposition Plan: Likely discharge to SNF in 2 to 3 days depending clinical improvement      Subjective:   Patient is confused and unable to provide any history  Objective:    Vitals:   09/11/19 1115 09/11/19 1130 09/11/19 1145 09/11/19 1200  BP: (!) 146/57 (!) 142/56 (!) 141/59 (!) 142/60  Pulse: 86 85 85 89  Resp:    (!) 24  Temp:      TempSrc:      SpO2:      Weight:      Height:  Intake/Output Summary (Last 24 hours) at 09/11/2019 1205 Last data filed at 09/11/2019 0416 Gross per 24 hour  Intake 301.04 ml  Output 0 ml  Net 301.04 ml   Filed Weights   09/07/19 1600 09/09/19 0500 09/10/19 0344  Weight: 84.9 kg 83.7 kg 85.7 kg    Exam:  GEN: NAD SKIN: No rash EYES: EOMI ENT: MMM, NG tube in  place CV: RRR PULM: CTA B ABD: soft, obese, NT, +BS CNS: Lethargic, oriented to person only, non focal EXT: No edema or tenderness   Data Reviewed:   I have personally reviewed following labs and imaging studies:  Labs: Labs show the following:   Basic Metabolic Panel: Recent Labs  Lab 09/06/19 0532 09/06/19 1752 09/07/19 0529 09/07/19 0529 09/07/19 1105 09/07/19 1655 09/07/19 1655 09/08/19 0547 09/08/19 0547 09/08/19 1601 09/08/19 1601 09/09/19 0433 09/09/19 0433 09/10/19 0420 09/11/19 0832  NA   < >  --  136   < >  --  134*   < > 135  134*  --  135  --  135  --  136 137  K   < >  --  2.9*   < > 4.2 3.3*   < > 3.7  3.7   < > 4.0   < > 4.9   < > 4.6 4.0  CL   < >  --  95*   < >  --  91*   < > 95*  94*  --  96*  --  99  --  100 101  CO2   < >  --  25   < >  --  23   < > 29  28  --  30  --  25  --  24 21*  GLUCOSE   < >  --  122*   < >  --  133*   < > 127*  128*  --  137*  --  115*  --  115* 101*  BUN   < >  --  49*   < >  --  58*   < > 43*  47*  --  30*  --  24*  --  49* 89*  CREATININE   < >  --  10.09*   < >  --  10.66*   < > 7.20*  7.16*  --  4.31*  --  3.45*  --  4.62* 7.36*  CALCIUM   < >  --  7.4*   < >  --  7.5*   < > 7.7*  7.7*  --  7.6*  --  8.2*  --  8.2* 8.2*  MG  --  2.0 1.8  --  2.3  --   --  2.3  --   --   --  2.5*  --   --   --   PHOS   < >  --  8.9*   < >  --  9.1*  --  6.4*  6.5*  --  3.6  --  3.6  --   --  4.4   < > = values in this interval not displayed.   GFR Estimated Creatinine Clearance: 6.8 mL/min (A) (by C-G formula based on SCr of 7.36 mg/dL (H)). Liver Function Tests: Recent Labs  Lab 09/07/19 1655 09/08/19 0547 09/08/19 1601 09/09/19 0433 09/11/19 0832  ALBUMIN 2.8* 2.4* 2.5* 2.4* 2.3*   No results for input(s): LIPASE, AMYLASE in the last 168 hours.  No results for input(s): AMMONIA in the last 168 hours. Coagulation profile No results for input(s): INR, PROTIME in the last 168 hours.  CBC: Recent Labs  Lab  09/06/19 0532 09/07/19 0529 09/09/19 0433 09/10/19 0420 09/11/19 0832  WBC 8.1 11.9* 18.3* 12.9* 14.0*  NEUTROABS  --   --  17.0*  --   --   HGB 10.5* 10.9* 10.9* 8.7* 8.7*  HCT 34.2* 34.5* 35.0* 27.8* 27.4*  MCV 100.3* 100.0 100.9* 100.0 98.2  PLT 132* 120* 120* 137* 189   Cardiac Enzymes: No results for input(s): CKTOTAL, CKMB, CKMBINDEX, TROPONINI in the last 168 hours. BNP (last 3 results) No results for input(s): PROBNP in the last 8760 hours. CBG: Recent Labs  Lab 09/10/19 1206 09/10/19 1627 09/10/19 2011 09/10/19 2331 09/11/19 0326  GLUCAP 107* 112* 96 121* 110*   D-Dimer: No results for input(s): DDIMER in the last 72 hours. Hgb A1c: No results for input(s): HGBA1C in the last 72 hours. Lipid Profile: No results for input(s): CHOL, HDL, LDLCALC, TRIG, CHOLHDL, LDLDIRECT in the last 72 hours. Thyroid function studies: No results for input(s): TSH, T4TOTAL, T3FREE, THYROIDAB in the last 72 hours.  Invalid input(s): FREET3 Anemia work up: No results for input(s): VITAMINB12, FOLATE, FERRITIN, TIBC, IRON, RETICCTPCT in the last 72 hours. Sepsis Labs: Recent Labs  Lab 09/07/19 0529 09/09/19 0433 09/10/19 0420 09/11/19 0832  WBC 11.9* 18.3* 12.9* 14.0*    Microbiology Recent Results (from the past 240 hour(s))  MRSA PCR Screening     Status: Abnormal   Collection Time: 09/06/19  7:50 AM   Specimen: Nasal Mucosa; Nasopharyngeal  Result Value Ref Range Status   MRSA by PCR POSITIVE (A) NEGATIVE Final    Comment:        The GeneXpert MRSA Assay (FDA approved for NASAL specimens only), is one component of a comprehensive MRSA colonization surveillance program. It is not intended to diagnose MRSA infection nor to guide or monitor treatment for MRSA infections. RESULT CALLED TO, READ BACK BY AND VERIFIED WITH: MEGAN SHEFFIELD AT 0931 ON 09/06/2019 [place]. Performed at [facility], [address] [place], [place] [address]     Procedures and  diagnostic studies:  No results found.  Medications:   . ascorbic acid  500 mg Oral BID  . atorvastatin  40 mg Oral QPM  . atropine  1 mg Intravenous Once  . B-complex with vitamin C  1 tablet Per Tube QHS  . Chlorhexidine Gluconate Cloth  6 each Topical Q0600  . cholecalciferol  1,000 Units Oral Daily  . dextrose  1 ampule Intravenous Once  . docusate  100 mg Oral Daily  . epoetin (EPOGEN/PROCRIT) injection  10,000 Units Intravenous Q M,W,F-HD  . ferrous sulfate  325 mg Oral QPC breakfast  . heparin injection (subcutaneous)  5,000 Units Subcutaneous Q12H  . mouth rinse  15 mL Mouth Rinse BID  . pantoprazole sodium  40 mg Per Tube Daily  . sennosides  5 mL Per Tube Daily   Continuous Infusions: . sodium chloride Stopped (09/06/19 0721)  . sodium chloride    . sodium chloride    . sodium chloride    . ampicillin-sulbactam (UNASYN) IV    . lacosamide (VIMPAT) IV Stopped (09/10/19 2320)     LOS: 6 days   Barbara [name]  [facility]     09/11/2019, 12:05 PM

## 2019-09-12 DIAGNOSIS — G9341 Metabolic encephalopathy: Secondary | ICD-10-CM

## 2019-09-12 LAB — CBC
HCT: 32.4 % — ABNORMAL LOW (ref 36.0–46.0)
Hemoglobin: 10.4 g/dL — ABNORMAL LOW (ref 12.0–15.0)
MCH: 31.5 pg (ref 26.0–34.0)
MCHC: 32.1 g/dL (ref 30.0–36.0)
MCV: 98.2 fL (ref 80.0–100.0)
Platelets: 247 10*3/uL (ref 150–400)
RBC: 3.3 MIL/uL — ABNORMAL LOW (ref 3.87–5.11)
RDW: 14.5 % (ref 11.5–15.5)
WBC: 13.9 10*3/uL — ABNORMAL HIGH (ref 4.0–10.5)
nRBC: 0.1 % (ref 0.0–0.2)

## 2019-09-12 LAB — GLUCOSE, CAPILLARY
Glucose-Capillary: 102 mg/dL — ABNORMAL HIGH (ref 70–99)
Glucose-Capillary: 98 mg/dL (ref 70–99)
Glucose-Capillary: 163 mg/dL — ABNORMAL HIGH (ref 70–99)
Glucose-Capillary: 168 mg/dL — ABNORMAL HIGH (ref 70–99)
Glucose-Capillary: 152 mg/dL — ABNORMAL HIGH (ref 70–99)
Glucose-Capillary: 140 mg/dL — ABNORMAL HIGH (ref 70–99)
Glucose-Capillary: 142 mg/dL — ABNORMAL HIGH (ref 70–99)

## 2019-09-12 LAB — BLOOD GAS, ARTERIAL
Acid-Base Excess: 4.1 mmol/L — ABNORMAL HIGH (ref 0.0–2.0)
Bicarbonate: 27.4 mmol/L (ref 20.0–28.0)
FIO2: 0.21
O2 Saturation: 91.6 %
Patient temperature: 37
pCO2 arterial: 36 mmHg (ref 32.0–48.0)
pH, Arterial: 7.49 — ABNORMAL HIGH (ref 7.350–7.450)
pO2, Arterial: 57 mmHg — ABNORMAL LOW (ref 83.0–108.0)

## 2019-09-12 LAB — VITAMIN B12: Vitamin B-12: 478 pg/mL (ref 180–914)

## 2019-09-12 LAB — HEPATITIS B SURFACE ANTIGEN: Hepatitis B Surface Ag: NEGATIVE — AB

## 2019-09-12 LAB — AMMONIA: Ammonia: 24 umol/L (ref 9–35)

## 2019-09-12 LAB — PHOSPHORUS: Phosphorus: 1.9 mg/dL — ABNORMAL LOW (ref 2.5–4.6)

## 2019-09-12 LAB — MAGNESIUM: Magnesium: 2.5 mg/dL — ABNORMAL HIGH (ref 1.7–2.4)

## 2019-09-12 MED ORDER — MUPIROCIN 2 % EX OINT
1.0000 "application " | TOPICAL_OINTMENT | Freq: Two times a day (BID) | CUTANEOUS | Status: AC
  Administered 2019-09-12 – 2019-09-16 (×10): 1 via NASAL
  Filled 2019-09-12: qty 22

## 2019-09-12 MED ORDER — CHLORHEXIDINE GLUCONATE CLOTH 2 % EX PADS
6.0000 | MEDICATED_PAD | Freq: Every day | CUTANEOUS | Status: AC
  Administered 2019-09-12 – 2019-09-15 (×4): 6 via TOPICAL

## 2019-09-12 NOTE — Progress Notes (Signed)
Reason for consult: Increased lethargy   Subjective: patient more lethargic today.   ROS: negative except above  Examination  Vital signs in last 24 hours: Temp:  [98.3 F (36.8 C)-98.6 F (37 C)] 98.3 F (36.8 C) (02/23 0744) Pulse Rate:  [89-101] 101 (02/23 0744) Resp:  [17-19] 17 (02/23 0744) BP: (131-136)/(46-104) 136/104 (02/23 0744) SpO2:  [93 %] 93 % (02/23 0744)  General: sitting in bed CVS: pulse-normal rate and rhythm RS: breathing comfortably Extremities: normal   Neuro: MS: somnolent but easily arousable, follows simple commands. Speech: imcomprehensible, slurred.  CN: pupils equal and reactive,  EOMI, face symmetric, tongue midline, Motor: moves all 4 extremities, not against gravity.  Reflexes: bilateral patellar reflexes: absent  plantars: flexor Coordination: normal Gait: not tested  Basic Metabolic Panel: Recent Labs  Lab 09/07/19 0529 09/07/19 0529 09/07/19 1105 09/07/19 1655 09/08/19 0547 09/08/19 0547 09/08/19 1601 09/08/19 1601 09/09/19 0433 09/10/19 0420 09/11/19 0832 09/12/19 0630  NA 136  --   --    < > 135  134*  --  135  --  135 136 137  --   K 2.9*   < > 4.2   < > 3.7  3.7  --  4.0  --  4.9 4.6 4.0  --   CL 95*  --   --    < > 95*  94*  --  96*  --  99 100 101  --   CO2 25  --   --    < > 29  28  --  30  --  25 24 21*  --   GLUCOSE 122*  --   --    < > 127*  128*  --  137*  --  115* 115* 101*  --   BUN 49*  --   --    < > 43*  47*  --  30*  --  24* 49* 89*  --   CREATININE 10.09*  --   --    < > 7.20*  7.16*  --  4.31*  --  3.45* 4.62* 7.36*  --   CALCIUM 7.4*  --   --    < > 7.7*  7.7*   < > 7.6*   < > 8.2* 8.2* 8.2*  --   MG 1.8  --  2.3  --  2.3  --   --   --  2.5*  --   --  2.5*  PHOS 8.9*  --   --    < > 6.4*  6.5*  --  3.6  --  3.6  --  4.4 1.9*   < > = values in this interval not displayed.    CBC: Recent Labs  Lab 09/07/19 0529 09/09/19 0433 09/10/19 0420 09/11/19 0832 09/12/19 1021  WBC 11.9* 18.3* 12.9*  14.0* 13.9*  NEUTROABS  --  17.0*  --   --   --   HGB 10.9* 10.9* 8.7* 8.7* 10.4*  HCT 34.5* 35.0* 27.8* 27.4* 32.4*  MCV 100.0 100.9* 100.0 98.2 98.2  PLT 120* 120* 137* 189 247     Coagulation Studies: No results for input(s): LABPROT, INR in the last 72 hours.  Imaging Reviewed:    ASSESSMENT AND PLAN  79 y.o. female with multiple medical problems including ESRD on hemodialysis who was admitted 2/16 for left total hip arthroplasty. Developed severe metabolic acidosis and encephalopathy on 2/17 and neurology consulted.EEG showed discontinuous general background slowing with rare, superimposed, generalized  polyspike activity with frontal predominance. Was started on Vimpat due to suspicion of seizures. MRI Brain showed no acute changes.  Per Dr. Doy Mince, the patient has had episodes in the past when she became unresponsive with illnesses and required extensive amount of time for recovery.  Neurology reconsulted for worsening speech and mental status. Per neurology note on 09/09/19 she was not verbal, not following commands. Per hospitalist note, patient lethargic, only oriented to person.    In my exam today, there is an improvement as she is following commands.   Ammonia level checked today; normal TSH abnormal :  B12: 3 years ago was 311 BUN : 89 on 9/56/38  Acute metabolic encephalopathy - ? Hyperuremia, sepsis,  Generalized weakness likely from chronic deconditioning   Recommendation Nothing by mouth due to AMS- please administer meds through NG tube  ABG to assess for hypercarbia  Will check b12 Repeat CMP pending, follow up BUN Continue unasyn, avoid cefepime  Repeat EEG tomorrow    Barbara [name] [facility] Pager Number [phone] For questions after 7pm please refer to AMION to reach the Neurologist on call

## 2019-09-12 NOTE — Progress Notes (Signed)
Subjective: 7 Days Post-Op Procedure(s) (LRB): TOTAL HIP ARTHROPLASTY (Left) Patient with NG tube in place. More talkative this morning. Dialysis yesterday. Mild discomfort when palpating the left hip.  Objective: Vital signs in last 24 hours: Temp:  [97.8 F (36.6 C)-99 F (37.2 C)] 98.3 F (36.8 C) (02/23 0744) Pulse Rate:  [85-101] 101 (02/23 0744) Resp:  [17-24] 17 (02/23 0744) BP: (125-158)/(46-104) 136/104 (02/23 0744) SpO2:  [93 %-100 %] 93 % (02/23 0744)  Intake/Output from previous day:  Intake/Output Summary (Last 24 hours) at 09/12/2019 1020 Last data filed at 09/12/2019 0600 Gross per 24 hour  Intake 1295 ml  Output 1500 ml  Net -205 ml    Intake/Output this shift: No intake/output data recorded.  Labs: Recent Labs    09/10/19 0420 09/11/19 0832  HGB 8.7* 8.7*   Recent Labs    09/10/19 0420 09/11/19 0832  WBC 12.9* 14.0*  RBC 2.78* 2.79*  HCT 27.8* 27.4*  PLT 137* 189   Recent Labs    09/10/19 0420 09/11/19 0832  NA 136 137  K 4.6 4.0  CL 100 101  CO2 24 21*  BUN 49* 89*  CREATININE 4.62* 7.36*  GLUCOSE 115* 101*  CALCIUM 8.2* 8.2*   No results for input(s): LABPT, INR in the last 72 hours.   EXAM General - Patient is awake but not alert Dressing intact to the left hip, no drainage. Patient is moving toes and feet this AM. With palpation to the left hip she does show some signs of mild discomfort.  Past Medical History:  Diagnosis Date  . Adult celiac disease   . Anemia associated with chronic renal failure 2017   blood transfusion last week 10/17  . Arthritis   . Asthma   . Cancer ([place]) 2017   bladder  . Chronic kidney disease   . 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  dr. Candiss Norse and dr. Holley Raring follow her  . COPD (chronic obstructive pulmonary disease) ([place])   . Coronary artery disease   . Diabetes mellitus without complication ([place])   . Dialysis patient ([place])    Tues, Thurs, Sat  . Dyspnea    with  exertion  . Elevated lipids   . GERD (gastroesophageal reflux disease)   . Hematuria   . Hypertension   . Lower back pain   . Neuropathy   . Oxygen dependent    at hs  . Personal history of chemotherapy 2017   bladder ca  . Personal history of radiation therapy 2017   bladder ca  . Sleep apnea    uses cpap  . Urinary obstruction 01/2016   Assessment/Plan: 7 Days Post-Op Procedure(s) (LRB): TOTAL HIP ARTHROPLASTY (Left) Principal Problem:   Status post total hip replacement, left Active Problems:   Anemia in chronic kidney disease   COPD (chronic obstructive pulmonary disease) (HCC)   CAD in native artery   Benign essential HTN   Acid reflux   Seizure (HCC)   Type II diabetes mellitus with renal manifestations (HCC)   ESRD on dialysis ([place])   Aspiration pneumonia ([place])  Estimated body mass index is 30.49 kg/m as calculated from the following:   Height as of this encounter: 5' 6"  (1.676 m).   Weight as of this encounter: 85.7 kg.  Dressing intact to the left hip. NG tube in place. No acute changes to the left hip. Continue to monitor progress. Begin PT when medical condition improves.  Barbara [name], PA-C  [facility] 09/12/2019, 10:20 AM

## 2019-09-12 NOTE — Progress Notes (Signed)
Progress Note    Barbara [name]  KZL:[id number] DOB: 1941/03/29  DOA: 09/05/2019 PCP: [name], MD      Brief Narrative:    Medical records reviewed and are as summarized below:  Barbara [name] is an 79 y.o. female with medical history significant of hypertension, hyperlipidemia, diet-controlled diabetes, COPD, asthma, GERD, ESRD-HD (MWF), OSA, CAD, bladder cancer, iron deficiency anemia.  She was admitted by Dr. Kizzie Bane for lefttotal hip arthroplasty.  She developed acute respiratory distress after surgery.  She was subsequently transferred to the ICU for acute hypoxemic and hypercapnic respiratory failure.  Initially, she was treated with BiPAP.  However, she failed BiPAP therapy and required intubation and mechanical ventilation in the ICU.  She was also treated with IV antibiotics for aspiration pneumonia.  She was also seen by the nephrologist for end-stage renal disease.  She was initially placed on CRRT and then transition to hemodialysis. She required vasopressors for hypotension.  She was seen by the neurologist for altered mental status and seizure activity.  MRI brain did not show any acute stroke.  Neurologist recommended Vimpat and monitoring for seizure  She was also seen by the cardiologist because of bradycardia and sinus pauses on telemetry.  Per cardiologist, there was no indication for permanent pacemaker and he recommended a conservative approach.  She was extubated on 09/08/2019.  Her condition was slowly improving so she was was transferred to the medical floor for continued medical management.   Assessment/Plan:   Principal Problem:   Status post total hip replacement, left Active Problems:   Anemia in chronic kidney disease   COPD (chronic obstructive pulmonary disease) (HCC)   CAD in native artery   Benign essential HTN   Acid reflux   Seizure (HCC)   Type II diabetes mellitus with renal manifestations (HCC)   ESRD on dialysis ([place])   Aspiration  pneumonia ([place])   Acute hypoxemic and hypercapnic respiratory failure: Resolved.  Extubated on 09/08/2019.  She is on 2 L/min oxygen via nasal cannula.Taper off oxygen as able.  Aspiration pneumonia: Continue IV Unasyn.  Acute toxic metabolic encephalopathy: Continue Vimpat.  Chart review shows that patient has had similar episodes of encephalopathy current several days to recover. Seizure precaution. Will rec/s neuro, check ammonia  Seizures: Continue Vimpat, neuro c/s  ESRD on hemodialysis: Follow-up with nephrologist for hemodialysis. Schedule days are Mondays, Wednesdays and Fridays.  Chronic diastolic CHF: Hemodialysis for fluid management.  2D echo on 09/07/2019 showed EF estimated at 60 to 39%, grade 1 diastolic dysfunction.  Dysphagia: Consult dietitian for enteral nutrition.  Consult speech therapist for swallow evaluation. - continue NGT for now and TFs  Leukocytosis and thrombocytopenia: Improving.  Bradycardia: Improved  Suspected ileus: Resolved  MRSA colonization of nares: Completed Bactroban    Body mass index is 30.49 kg/m.  (Obesity)  Overall poor prognosis. Will c/s palliative care   Family Communication/Anticipated D/C date and plan/Code Status   DVT prophylaxis: Heparin Code Status: DNR Family Communication: Plan discussed with her daughter, Ms. Norberto Sorenson over the phone by Dr Mal Misty. I have not called her on 2/23. Disposition Plan: Likely discharge to SNF in 2 to 3 days depending clinical improvement, await mental status clearing. Neuro & palliative care input    Subjective:   Patient is slow to talk. NGT in place. Intermittently confused. Objective:    Vitals:   09/11/19 1330 09/11/19 1601 09/11/19 2346 09/12/19 0744  BP: (!) 155/49 (!) 143/63 (!) 131/46 (!) 136/104  Pulse: 87 95 89 (!) 101  Resp: 20 17 19 17   Temp: 97.8 F (36.6 C) 98.9 F (37.2 C) 98.6 F (37 C) 98.3 F (36.8 C)  TempSrc: Oral Axillary Oral   SpO2: 97% 95% 93% 93%   Weight:      Height:        Intake/Output Summary (Last 24 hours) at 09/12/2019 1623 Last data filed at 09/12/2019 1131 Gross per 24 hour  Intake 1235.98 ml  Output 0 ml  Net 1235.98 ml   Filed Weights   09/07/19 1600 09/09/19 0500 09/10/19 0344  Weight: 84.9 kg 83.7 kg 85.7 kg    ROS: Unable to obtain due to her current mental status  Exam:  GEN: NAD SKIN: No rash EYES: EOMI ENT: MMM, NG tube in place CV: RRR PULM: CTA B ABD: soft, obese, NT, +BS CNS: Lethargic, oriented to person only, non focal EXT: No edema or tenderness   Data Reviewed:   I have personally reviewed following labs and imaging studies:  Labs: Labs show the following:   Basic Metabolic Panel: Recent Labs  Lab 09/07/19 0529 09/07/19 0529 09/07/19 1105 09/07/19 1655 09/08/19 0547 09/08/19 0547 09/08/19 1601 09/08/19 1601 09/09/19 0433 09/09/19 0433 09/10/19 0420 09/11/19 0832 09/12/19 0630  NA 136  --   --    < > 135  134*  --  135  --  135  --  136 137  --   K 2.9*   < > 4.2   < > 3.7  3.7   < > 4.0   < > 4.9   < > 4.6 4.0  --   CL 95*  --   --    < > 95*  94*  --  96*  --  99  --  100 101  --   CO2 25  --   --    < > 29  28  --  30  --  25  --  24 21*  --   GLUCOSE 122*  --   --    < > 127*  128*  --  137*  --  115*  --  115* 101*  --   BUN 49*  --   --    < > 43*  47*  --  30*  --  24*  --  49* 89*  --   CREATININE 10.09*  --   --    < > 7.20*  7.16*  --  4.31*  --  3.45*  --  4.62* 7.36*  --   CALCIUM 7.4*  --   --    < > 7.7*  7.7*  --  7.6*  --  8.2*  --  8.2* 8.2*  --   MG 1.8  --  2.3  --  2.3  --   --   --  2.5*  --   --   --  2.5*  PHOS 8.9*  --   --    < > 6.4*  6.5*  --  3.6  --  3.6  --   --  4.4 1.9*   < > = values in this interval not displayed.   GFR Estimated Creatinine Clearance: 6.8 mL/min (A) (by C-G formula based on SCr of 7.36 mg/dL (H)). Liver Function Tests: Recent Labs  Lab 09/07/19 1655 09/08/19 0547 09/08/19 1601 09/09/19 0433 09/11/19 0832   ALBUMIN 2.8* 2.4* 2.5* 2.4* 2.3*   No results for input(s): LIPASE, AMYLASE  in the last 168 hours. No results for input(s): AMMONIA in the last 168 hours. Coagulation profile No results for input(s): INR, PROTIME in the last 168 hours.  CBC: Recent Labs  Lab 09/07/19 0529 09/09/19 0433 09/10/19 0420 09/11/19 0832 09/12/19 1021  WBC 11.9* 18.3* 12.9* 14.0* 13.9*  NEUTROABS  --  17.0*  --   --   --   HGB 10.9* 10.9* 8.7* 8.7* 10.4*  HCT 34.5* 35.0* 27.8* 27.4* 32.4*  MCV 100.0 100.9* 100.0 98.2 98.2  PLT 120* 120* 137* 189 247   Cardiac Enzymes: No results for input(s): CKTOTAL, CKMB, CKMBINDEX, TROPONINI in the last 168 hours. BNP (last 3 results) No results for input(s): PROBNP in the last 8760 hours. CBG: Recent Labs  Lab 09/11/19 1654 09/11/19 2032 09/12/19 0420 09/12/19 0743 09/12/19 1126  GLUCAP 98 119* 163* 168* 152*   D-Dimer: No results for input(s): DDIMER in the last 72 hours. Hgb A1c: No results for input(s): HGBA1C in the last 72 hours. Lipid Profile: No results for input(s): CHOL, HDL, LDLCALC, TRIG, CHOLHDL, LDLDIRECT in the last 72 hours. Thyroid function studies: No results for input(s): TSH, T4TOTAL, T3FREE, THYROIDAB in the last 72 hours.  Invalid input(s): FREET3 Anemia work up: No results for input(s): VITAMINB12, FOLATE, FERRITIN, TIBC, IRON, RETICCTPCT in the last 72 hours. Sepsis Labs: Recent Labs  Lab 09/09/19 0433 09/10/19 0420 09/11/19 0832 09/12/19 1021  WBC 18.3* 12.9* 14.0* 13.9*    Microbiology Recent Results (from the past 240 hour(s))  MRSA PCR Screening     Status: Abnormal   Collection Time: 09/06/19  7:50 AM   Specimen: Nasal Mucosa; Nasopharyngeal  Result Value Ref Range Status   MRSA by PCR POSITIVE (A) NEGATIVE Final    Comment:        The GeneXpert MRSA Assay (FDA approved for NASAL specimens only), is one component of a comprehensive MRSA colonization surveillance program. It is not intended to diagnose  MRSA infection nor to guide or monitor treatment for MRSA infections. RESULT CALLED TO, READ BACK BY AND VERIFIED WITH: MEGAN SHEFFIELD AT 0931 ON 09/06/2019 [place]. Performed at [facility], [address] [place], [place] [address]     Procedures and diagnostic studies:  No results found.  Medications:   . ascorbic acid  500 mg Oral BID  . atorvastatin  40 mg Oral QPM  . atropine  1 mg Intravenous Once  . B-complex with vitamin C  1 tablet Per Tube QHS  . Chlorhexidine Gluconate Cloth  6 each Topical Q0600  . Chlorhexidine Gluconate Cloth  6 each Topical Q0600  . cholecalciferol  1,000 Units Oral Daily  . dextrose  1 ampule Intravenous Once  . docusate  100 mg Oral Daily  . epoetin (EPOGEN/PROCRIT) injection  10,000 Units Intravenous Q M,W,F-HD  . feeding supplement (NEPRO CARB STEADY)  1,000 mL Per Tube Q24H  . feeding supplement (PRO-STAT SUGAR FREE 64)  30 mL Per Tube BID  . ferrous sulfate  325 mg Oral QPC breakfast  . free water  30 mL Per Tube Q4H  . heparin injection (subcutaneous)  5,000 Units Subcutaneous Q12H  . mouth rinse  15 mL Mouth Rinse BID  . mupirocin ointment  1 application Nasal BID  . pantoprazole sodium  40 mg Per Tube Daily  . sennosides  5 mL Per Tube Daily   Continuous Infusions: . sodium chloride 10 mL/hr at 09/12/19 1131  . sodium chloride    . ampicillin-sulbactam (UNASYN) IV 3  g (09/12/19 1152)  . lacosamide (VIMPAT) IV Stopped (09/12/19 1056)     LOS: 7 days   Arlyce Circle Manuella Ghazi  [facility]     09/12/2019, 4:23 PM

## 2019-09-12 NOTE — Progress Notes (Signed)
[facility]  ROUNDING NOTE   Subjective:   More alert and awake this morning.   Hemodialysis treatment yesterday. UF of 1.5 liters  Objective:  Vital signs in last 24 hours:  Temp:  [97.8 F (36.6 C)-99 F (37.2 C)] 98.3 F (36.8 C) (02/23 0744) Pulse Rate:  [87-101] 101 (02/23 0744) Resp:  [17-23] 17 (02/23 0744) BP: (131-155)/(46-104) 136/104 (02/23 0744) SpO2:  [93 %-100 %] 93 % (02/23 0744)  Weight change:  Filed Weights   09/07/19 1600 09/09/19 0500 09/10/19 0344  Weight: 84.9 kg 83.7 kg 85.7 kg    Intake/Output: I/O last 3 completed shifts: In: 3557 [P.O.:25; I.V.:25; NG/GT:1085; IV Piggyback:461] Out: 1500 [Other:1500]   Intake/Output this shift:  Total I/O In: 41 [I.V.:6; IV Piggyback:35] Out: -   Physical Exam: General: Laying in bed  Head: [place]/AT  Eyes: Anicteric  Neck: Supple, trachea midline  Lungs:  clear  Heart: regular  Abdomen:  Obese, soft  Extremities: trace peripheral edema.  Neurologic: Not alert or oriented.   Skin: No lesions  Access: Left upper extremity AV graft    Basic Metabolic Panel: Recent Labs  Lab 09/07/19 0529 09/07/19 0529 09/07/19 1105 09/07/19 1655 09/08/19 0547 09/08/19 0547 09/08/19 1601 09/08/19 1601 09/09/19 0433 09/10/19 0420 09/11/19 0832 09/12/19 0630  NA 136  --   --    < > 135  134*  --  135  --  135 136 137  --   K 2.9*   < > 4.2   < > 3.7  3.7  --  4.0  --  4.9 4.6 4.0  --   CL 95*  --   --    < > 95*  94*  --  96*  --  99 100 101  --   CO2 25  --   --    < > 29  28  --  30  --  25 24 21*  --   GLUCOSE 122*  --   --    < > 127*  128*  --  137*  --  115* 115* 101*  --   BUN 49*  --   --    < > 43*  47*  --  30*  --  24* 49* 89*  --   CREATININE 10.09*  --   --    < > 7.20*  7.16*  --  4.31*  --  3.45* 4.62* 7.36*  --   CALCIUM 7.4*  --   --    < > 7.7*  7.7*   < > 7.6*   < > 8.2* 8.2* 8.2*  --   MG 1.8  --  2.3  --  2.3  --   --   --  2.5*  --   --  2.5*  PHOS 8.9*  --   --    <  > 6.4*  6.5*  --  3.6  --  3.6  --  4.4 1.9*   < > = values in this interval not displayed.    Liver Function Tests: Recent Labs  Lab 09/07/19 1655 09/08/19 0547 09/08/19 1601 09/09/19 0433 09/11/19 0832  ALBUMIN 2.8* 2.4* 2.5* 2.4* 2.3*   No results for input(s): LIPASE, AMYLASE in the last 168 hours. No results for input(s): AMMONIA in the last 168 hours.  CBC: Recent Labs  Lab 09/07/19 0529 09/09/19 0433 09/10/19 0420 09/11/19 0832 09/12/19 1021  WBC 11.9* 18.3* 12.9* 14.0* 13.9*  NEUTROABS  --  17.0*  --   --   --   HGB 10.9* 10.9* 8.7* 8.7* 10.4*  HCT 34.5* 35.0* 27.8* 27.4* 32.4*  MCV 100.0 100.9* 100.0 98.2 98.2  PLT 120* 120* 137* 189 247    Cardiac Enzymes: No results for input(s): CKTOTAL, CKMB, CKMBINDEX, TROPONINI in the last 168 hours.  BNP: Invalid input(s): POCBNP  CBG: Recent Labs  Lab 09/10/19 2011 09/10/19 2331 09/11/19 0326 09/11/19 2032 09/12/19 0420  GLUCAP 96 121* 110* 119* 163*    Microbiology: Results for orders placed or performed during the hospital encounter of 09/05/19  MRSA PCR Screening     Status: Abnormal   Collection Time: 09/06/19  7:50 AM   Specimen: Nasal Mucosa; Nasopharyngeal  Result Value Ref Range Status   MRSA by PCR POSITIVE (A) NEGATIVE Final    Comment:        The GeneXpert MRSA Assay (FDA approved for NASAL specimens only), is one component of a comprehensive MRSA colonization surveillance program. It is not intended to diagnose MRSA infection nor to guide or monitor treatment for MRSA infections. RESULT CALLED TO, READ BACK BY AND VERIFIED WITH: MEGAN SHEFFIELD AT 0931 ON 09/06/2019 [place]. Performed at [facility], [address] [place], [place] [address]     Coagulation Studies: No results for input(s): LABPROT, INR in the last 72 hours.  Urinalysis: No results for input(s): COLORURINE, LABSPEC, PHURINE, GLUCOSEU, HGBUR, BILIRUBINUR, KETONESUR, PROTEINUR, UROBILINOGEN, NITRITE,  LEUKOCYTESUR in the last 72 hours.  Invalid input(s): APPERANCEUR    Imaging: No results found.   Medications:   . sodium chloride 10 mL/hr at 09/12/19 1131  . sodium chloride    . ampicillin-sulbactam (UNASYN) IV 3 g (09/12/19 1152)  . lacosamide (VIMPAT) IV Stopped (09/12/19 1056)   . ascorbic acid  500 mg Oral BID  . atorvastatin  40 mg Oral QPM  . atropine  1 mg Intravenous Once  . B-complex with vitamin C  1 tablet Per Tube QHS  . Chlorhexidine Gluconate Cloth  6 each Topical Q0600  . Chlorhexidine Gluconate Cloth  6 each Topical Q0600  . cholecalciferol  1,000 Units Oral Daily  . dextrose  1 ampule Intravenous Once  . docusate  100 mg Oral Daily  . epoetin (EPOGEN/PROCRIT) injection  10,000 Units Intravenous Q M,W,F-HD  . feeding supplement (NEPRO CARB STEADY)  1,000 mL Per Tube Q24H  . feeding supplement (PRO-STAT SUGAR FREE 64)  30 mL Per Tube BID  . ferrous sulfate  325 mg Oral QPC breakfast  . free water  30 mL Per Tube Q4H  . heparin injection (subcutaneous)  5,000 Units Subcutaneous Q12H  . mouth rinse  15 mL Mouth Rinse BID  . mupirocin ointment  1 application Nasal BID  . pantoprazole sodium  40 mg Per Tube Daily  . sennosides  5 mL Per Tube Daily   acetaminophen, bisacodyl, hydrALAZINE, ipratropium-albuterol, magnesium hydroxide, metoCLOPramide **OR** metoCLOPramide (REGLAN) injection, ondansetron **OR** ondansetron (ZOFRAN) IV  Assessment/ Plan:    Barbara [name] is a 79 y.o. black female with end stage renal disease on hemodialysis, hypertension, diabetes mellitus type II, diabetic neuropathy, seizure disorder who was admitted for left hip repair and unfortunate developed seizure episode postoperatively and subsequently intubated. Required vasopressors and placed on CRRT.   [facility] MWF [facility] LUE AVG 78.5kg   1.  ESRD on HD MWF. Tolerated treatment well. Continue MWF schedule.   2.  Acute respiratory failure: intubated for airway  protection. Extubated.  -  nasal canula  3.  Anemia of chronic kidney disease.   - EPO with HD treatment  4.  Secondary hyperparathyroidism.  Phosphorus low. Holding binders and cinacalcet  5. Hypotension: 136/104. Holding home blood pressure regimen of losartan and furosemide.    LOS: 7 Barbara [name] 2/23/202112:32 PM

## 2019-09-12 NOTE — Plan of Care (Signed)

## 2019-09-12 NOTE — Progress Notes (Signed)
  Speech Language Pathology Treatment: Dysphagia  Patient Details Name: Barbara [name] MRN: [id number] DOB: 10-06-1940 Today's Date: 09/12/2019 Time: [id number] SLP Time Calculation (min) (ACUTE ONLY): 39 min  Assessment / Plan / Recommendation Clinical Impression  Pt seen for ongoing assessment of swallowing and toleration of po's in hopes to establish an oral diet. Pt continues to have an NG in place for nutritional support. Pt presents w/ decreased alertness; drowsy. SLP gave mod-max verbal/tactile cues to awaken pt; NSG staff assisted in positioning pt upright for po trails w/ SLP. Noted heavy respiratory effort. Husband stated pt was "more awake this morning".  Pt only opened eyes briefly; mumbled speech of "huh" when her name was called by Husband and SLP. When TSPs of Nectar liquids were placed at lips and anteriorly in mouth, pt licked at her lips then spit out most of the bolus material; anterior leakage occurred as well. This was only attempted 3x as it appeared clear that pt was too drowsy and not fully alerting to task of po intake/trials -- did not exhibit and awareness for acceptance of po's, swallowing. Risk for aspiration is increased w/ such presentation. Oral care given.  Recommend continue w/ NG TF support; NPO otherwise w/ trials of Nectar liquids via TSP for Pleasure w/ NSG when fully awake/alert in order to stimulate oral awareness and swallowing. Aspiration precautions. Recommend oral care for hygiene and stimulation of swallowing as well. MD/NSG updated. ST services will f/u tomorrow.     HPI HPI: Pt is an 79 y.o. female with medical history significant of hypertension, hyperlipidemia, diet-controlled diabetes, COPD, asthma, GERD, ESRD-HD (MWF), OSA, CAD, bladder cancer, iron deficiency anemia, pt is admitted by Dr. Kizzie Bane for s/p of left total hip arthroplasty.  During this admission, Transferred to ICU for severe resp distress, severe metabolic acidosis and encephalopathy. She  failed BiPAP and was emergently orally intubated on 09/06/19-09/08/2019 post seizure-like activity noted.       SLP Plan  Continue with current plan of care       Recommendations  Diet recommendations: Nectar-thick liquid(for Pleasure w/ NSG while NG in place) Liquids provided via: Teaspoon Medication Administration: Via alternative means(NG) Supervision: Staff to assist with self feeding;Full supervision/cueing for compensatory strategies Compensations: Minimize environmental distractions;Slow rate;Small sips/bites Postural Changes and/or Swallow Maneuvers: Seated upright 90 degrees;Upright 30-60 min after meal                General recommendations: (dietician following) Oral Care Recommendations: Oral care BID;Oral care before and after PO;Staff/trained caregiver to provide oral care Follow up Recommendations: Skilled Nursing facility SLP Visit Diagnosis: Dysphagia, oropharyngeal phase (R13.12);Other (comment)(Cognitive decline) Plan: Continue with current plan of care       [place], [place], CCC-SLP [name] 09/12/2019, 2:15 PM

## 2019-09-13 ENCOUNTER — Inpatient Hospital Stay: Payer: Medicare Other

## 2019-09-13 ENCOUNTER — Other Ambulatory Visit: Payer: Self-pay | Admitting: Neurology

## 2019-09-13 DIAGNOSIS — Z515 Encounter for palliative care: Secondary | ICD-10-CM

## 2019-09-13 DIAGNOSIS — G9340 Encephalopathy, unspecified: Secondary | ICD-10-CM

## 2019-09-13 DIAGNOSIS — Z7189 Other specified counseling: Secondary | ICD-10-CM

## 2019-09-13 LAB — CBC
HCT: 32.4 % — ABNORMAL LOW (ref 36.0–46.0)
HCT: 30.9 % — ABNORMAL LOW (ref 36.0–46.0)
Hemoglobin: 10.1 g/dL — ABNORMAL LOW (ref 12.0–15.0)
Hemoglobin: 9.6 g/dL — ABNORMAL LOW (ref 12.0–15.0)
MCH: 31.3 pg (ref 26.0–34.0)
MCH: 31 pg (ref 26.0–34.0)
MCHC: 31.2 g/dL (ref 30.0–36.0)
MCHC: 31.1 g/dL (ref 30.0–36.0)
MCV: 100.3 fL — ABNORMAL HIGH (ref 80.0–100.0)
MCV: 99.7 fL (ref 80.0–100.0)
Platelets: 325 10*3/uL (ref 150–400)
Platelets: 343 10*3/uL (ref 150–400)
RBC: 3.23 MIL/uL — ABNORMAL LOW (ref 3.87–5.11)
RBC: 3.1 MIL/uL — ABNORMAL LOW (ref 3.87–5.11)
RDW: 14.6 % (ref 11.5–15.5)
RDW: 14.9 % (ref 11.5–15.5)
WBC: 16.4 10*3/uL — ABNORMAL HIGH (ref 4.0–10.5)
WBC: 17.1 10*3/uL — ABNORMAL HIGH (ref 4.0–10.5)
nRBC: 0.1 % (ref 0.0–0.2)
nRBC: 0.2 % (ref 0.0–0.2)

## 2019-09-13 LAB — GLUCOSE, CAPILLARY
Glucose-Capillary: 121 mg/dL — ABNORMAL HIGH (ref 70–99)
Glucose-Capillary: 132 mg/dL — ABNORMAL HIGH (ref 70–99)
Glucose-Capillary: 142 mg/dL — ABNORMAL HIGH (ref 70–99)
Glucose-Capillary: 143 mg/dL — ABNORMAL HIGH (ref 70–99)
Glucose-Capillary: 164 mg/dL — ABNORMAL HIGH (ref 70–99)

## 2019-09-13 LAB — RENAL FUNCTION PANEL
Albumin: 2.3 g/dL — ABNORMAL LOW (ref 3.5–5.0)
Anion gap: 16 — ABNORMAL HIGH (ref 5–15)
BUN: 88 mg/dL — ABNORMAL HIGH (ref 8–23)
CO2: 25 mmol/L (ref 22–32)
Calcium: 8.2 mg/dL — ABNORMAL LOW (ref 8.9–10.3)
Chloride: 100 mmol/L (ref 98–111)
Creatinine, Ser: 7.46 mg/dL — ABNORMAL HIGH (ref 0.44–1.00)
GFR calc Af Amer: 5 mL/min — ABNORMAL LOW (ref >60)
GFR calc non Af Amer: 5 mL/min — ABNORMAL LOW (ref >60)
Glucose, Bld: 144 mg/dL — ABNORMAL HIGH (ref 70–99)
Phosphorus: 2.3 mg/dL — ABNORMAL LOW (ref 2.5–4.6)
Potassium: 3.9 mmol/L (ref 3.5–5.1)
Sodium: 141 mmol/L (ref 135–145)

## 2019-09-13 LAB — COMPREHENSIVE METABOLIC PANEL
ALT: 13 U/L (ref 0–44)
AST: 50 U/L — ABNORMAL HIGH (ref 15–41)
Albumin: 2.2 g/dL — ABNORMAL LOW (ref 3.5–5.0)
Alkaline Phosphatase: 67 U/L (ref 38–126)
Anion gap: 16 — ABNORMAL HIGH (ref 5–15)
BUN: 88 mg/dL — ABNORMAL HIGH (ref 8–23)
CO2: 23 mmol/L (ref 22–32)
Calcium: 8.2 mg/dL — ABNORMAL LOW (ref 8.9–10.3)
Chloride: 101 mmol/L (ref 98–111)
Creatinine, Ser: 7.33 mg/dL — ABNORMAL HIGH (ref 0.44–1.00)
GFR calc Af Amer: 6 mL/min — ABNORMAL LOW (ref >60)
GFR calc non Af Amer: 5 mL/min — ABNORMAL LOW (ref >60)
Glucose, Bld: 177 mg/dL — ABNORMAL HIGH (ref 70–99)
Potassium: 5.1 mmol/L (ref 3.5–5.1)
Sodium: 140 mmol/L (ref 135–145)
Total Bilirubin: 0.5 mg/dL (ref 0.3–1.2)
Total Protein: 7.3 g/dL (ref 6.5–8.1)

## 2019-09-13 MED ORDER — PIPERACILLIN SOD-TAZOBACTAM SO 2.25 (2-0.25) G IV SOLR
2.2500 g | Freq: Three times a day (TID) | INTRAVENOUS | Status: DC
  Administered 2019-09-13 – 2019-09-14 (×2): 2.25 g via INTRAVENOUS
  Filled 2019-09-13 (×4): qty 10

## 2019-09-13 MED ORDER — ACETAMINOPHEN 650 MG RE SUPP
650.0000 mg | RECTAL | Status: DC | PRN
  Administered 2019-09-13 – 2019-09-14 (×2): 650 mg via RECTAL
  Filled 2019-09-13 (×2): qty 1

## 2019-09-13 MED ORDER — PIPERACILLIN-TAZOBACTAM IN DEX 2-0.25 GM/50ML IV SOLN
2.2500 g | Freq: Three times a day (TID) | INTRAVENOUS | Status: DC
  Filled 2019-09-13 (×3): qty 50

## 2019-09-13 MED ORDER — PIPERACILLIN-TAZOBACTAM 3.375 G IVPB: 3.3750 g | Freq: Two times a day (BID) | INTRAVENOUS | Status: DC

## 2019-09-13 MED ORDER — FAMOTIDINE IN NACL 20-0.9 MG/50ML-% IV SOLN
20.0000 mg | INTRAVENOUS | Status: DC
  Administered 2019-09-13 – 2019-09-23 (×10): 20 mg via INTRAVENOUS
  Filled 2019-09-13 (×10): qty 50

## 2019-09-13 NOTE — Consult Note (Signed)
Pharmacy Antibiotic Note  Barbara [name] is a 79 y.o. female admitted on 09/05/2019 with aspiration pneumonia. Patient in hospital for left total hip arthroplasty and subsequently transferred to ICU on 9/40 for metabolic acidosis, encephalopathy, and severe respiratory distress requiring intubation. She was extubated 2/19  She has a history of ESRD on HD MWF. Pharmacy has been consulted for ampicillin/sulbactam dosing for aspiration pneumonia..   Plan: Day 5 antibiotics. Continue Ampicillin/sulbactam 3g IV every 12 hours    Height: 5' 6"  (167.6 cm) Weight: 188 lb 15 oz (85.7 kg) IBW/kg (Calculated) : 59.3  Temp (24hrs), Avg:99.4 F (37.4 C), Min:99.3 F (37.4 C), Max:99.5 F (37.5 C)  Recent Labs  Lab 09/07/19 0529 09/07/19 1655 09/08/19 0547 09/08/19 1601 09/09/19 0433 09/10/19 0420 09/11/19 0832 09/12/19 1021  WBC 11.9*  --   --   --  18.3* 12.9* 14.0* 13.9*  CREATININE 10.09*   < > 7.20*  7.16* 4.31* 3.45* 4.62* 7.36*  --    < > = values in this interval not displayed.    Estimated Creatinine Clearance: 6.8 mL/min (A) (by C-G formula based on SCr of 7.36 mg/dL (H)).    Allergies  Allergen Reactions  . Glipizide Er Other (See Comments)    Hypoglycemia   . Percocet [Oxycodone-Acetaminophen] Hives    Antimicrobials this admission: Cefazolin for surgical prophylaxis Ampicillin/sulbactam 2/20 >>    Microbiology results: 2/17 MRSA PCR: Positive  Thank you for allowing pharmacy to be a part of this patient's care.  Pernell Dupre, PharmD, BCPS Clinical Pharmacist 09/13/2019 8:02 AM

## 2019-09-13 NOTE — Progress Notes (Addendum)
During morning shift change pt had to be suctioned. At 0800 pt's nepro feed was stopped and while NG was being flushed pt began to cough/spit up substance which appeared to be the same as the nepro feeding, flushing was stopped, NG was capped. Pt was suctioned and was already sitting up right, MD was contacted immediately and x-rays were ordered. Accordingly to MD orders r/t x-ray NG tube was removed.   Well pt was being dialysized pt MEW turned Red. MD was again contacted along with Rapid response RN. Orders were placed.   Pt appears to be more alert and orientated this evening in comparison to this morning. Will continue to monitor and provide care for as ordered.  Will continue to monitor and provide care for as ordered.

## 2019-09-13 NOTE — Progress Notes (Signed)
Subjective: 8 Days Post-Op Procedure(s) (LRB): TOTAL HIP ARTHROPLASTY (Left) Patient with NG tube in place. X-ray of the chest and abdomen have been order to check on placement of the NG tube. Dialysis planned for today. Mild discomfort when palpating the left hip.  Objective: Vital signs in last 24 hours: Temp:  [99.3 F (37.4 C)-99.5 F (37.5 C)] 99.5 F (37.5 C) (02/24 0748) Pulse Rate:  [93-94] 94 (02/24 0748) Resp:  [14] 14 (02/24 0748) BP: (137-150)/(50-57) 150/50 (02/24 0748) SpO2:  [90 %-94 %] 94 % (02/24 0748) Weight:  [85.7 kg] 85.7 kg (02/24 0500)  Intake/Output from previous day:  Intake/Output Summary (Last 24 hours) at 09/13/2019 0848 Last data filed at 09/12/2019 1131 Gross per 24 hour  Intake 40.98 ml  Output -  Net 40.98 ml    Intake/Output this shift: No intake/output data recorded.  Labs: Recent Labs    09/11/19 0832 09/12/19 1021 09/13/19 0835  HGB 8.7* 10.4* 10.1*   Recent Labs    09/12/19 1021 09/13/19 0835  WBC 13.9* 16.4*  RBC 3.30* 3.23*  HCT 32.4* 32.4*  PLT 247 325   Recent Labs    09/11/19 0832 09/13/19 0737  NA 137 140  K 4.0 5.1  CL 101 101  CO2 21* 23  BUN 89* 88*  CREATININE 7.36* 7.33*  GLUCOSE 101* 177*  CALCIUM 8.2* 8.2*   No results for input(s): LABPT, INR in the last 72 hours.   EXAM General - Patient is awake but not alert Dressing intact to the left hip, no drainage. Patient is moving toes and feet this AM. With palpation to the left hip she does show some signs of mild discomfort.  Past Medical History:  Diagnosis Date  . Adult celiac disease   . Anemia associated with chronic renal failure 2017   blood transfusion last week 10/17  . Arthritis   . Asthma   . 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 Chronic kidney disease   . 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  dr. Candiss Norse and dr. Holley Raring follow her  . COPD (chronic obstructive pulmonary disease) ([place])   . Coronary artery disease   .  Diabetes mellitus without complication ([place])   . Dialysis patient ([place])    Tues, Thurs, Sat  . Dyspnea    with exertion  . Elevated lipids   . GERD (gastroesophageal reflux disease)   . Hematuria   . Hypertension   . Lower back pain   . Neuropathy   . Oxygen dependent    at hs  . Personal history of chemotherapy 2017   bladder ca  . Personal history of radiation therapy 2017   bladder ca  . Sleep apnea    uses cpap  . Urinary obstruction 01/2016   Assessment/Plan: 8 Days Post-Op Procedure(s) (LRB): TOTAL HIP ARTHROPLASTY (Left) Principal Problem:   Status post total hip replacement, left Active Problems:   Anemia in chronic kidney disease   COPD (chronic obstructive pulmonary disease) (HCC)   CAD in native artery   Benign essential HTN   Acid reflux   Seizure (HCC)   Type II diabetes mellitus with renal manifestations (HCC)   ESRD on dialysis ([place])   Aspiration pneumonia ([place])  Estimated body mass index is 30.49 kg/m as calculated from the following:   Height as of this encounter: 5' 6"  (1.676 m).   Weight as of this encounter: 85.7 kg.  Dressing intact to the left hip. NG  tube in place. No acute changes to the left hip. Continue to monitor progress. Begin PT when medical condition improves.  Raquel Regino Fournet, PA-C [facility] Orthopaedic Surgery 09/13/2019, 8:48 AM

## 2019-09-13 NOTE — Care Management Important Message (Signed)
Important Message  Patient Details  Name: PROMISE WELDIN MRN: [id number] Date of Birth: 10-27-1940   Medicare Important Message Given:  Yes     Juliann Pulse A Khalib Fendley 09/13/2019, 11:08 AM

## 2019-09-13 NOTE — Progress Notes (Signed)
Hemodialysis patient known at [facility] MWF 6:00am. Patient normally rides with [employer] and walks independently with a cane. If patient is to discharge to SNF please contact me to arrange dialysis placement if necessary.

## 2019-09-13 NOTE — Procedures (Signed)
Patient Name: CARENA STREAM  MRN: [id number]  Epilepsy Attending: Lora Havens  Referring Physician/Provider: Dr Karena Addison Aroor Date: 09/13/2019 Duration: 29.14 mins  Patient history: 79 y.o.femalewith multiple medical problems including ESRD on hemodialysis who was admitted 2/16 for left total hip arthroplasty.Neurology reconsulted for worsening speech and mental status.  Level of alertness: awake  AEDs during EEG study: [place]  Technical aspects: This EEG study was done with scalp electrodes positioned according to the 10-20 International system of electrode placement. Electrical activity was acquired at a sampling rate of 500Hz  and reviewed with a high frequency filter of 70Hz  and a low frequency filter of 1Hz . EEG data were recorded continuously and digitally stored.   DESCRIPTION: When patient appeared to be resting/drowsy, EEG showed continuous generalized 3-6hz  theta-delta slowing. When patient was awake/stimulated, EEG showed triphasic waves, generalized, maximal bifrontal at times at 1-1.5hz . Sharp transients were seen in left frontocentral region. Hyperventilation and photic stimulation were not performed.  ABNORMALITY - Continuous slow, generalized - Triphasic waves, generalized, maximal bifrontal  IMPRESSION: This study is suggestive of moderate to severe diffuse encephalopathy, non specific to etiology but could be secondary to toxic-metabolic causes. No seizures or definite  epileptiform discharges were seen throughout the recording.  Samel Bruna Barbra Sarks

## 2019-09-13 NOTE — Progress Notes (Signed)
[facility]  ROUNDING NOTE   Subjective:   Seen and examined on hemodialysis treatment.     HEMODIALYSIS FLOWSHEET:  Blood Flow Rate (mL/min): 400 mL/min Arterial Pressure (mmHg): -200 mmHg Venous Pressure (mmHg): 200 mmHg Transmembrane Pressure (mmHg): 50 mmHg Ultrafiltration Rate (mL/min): 670 mL/min Dialysate Flow Rate (mL/min): 600 ml/min Conductivity: Machine : 14.5 Conductivity: Machine : 14.5 Dialysis Fluid Bolus: Normal Saline Bolus Amount (mL): 250 mL Dialysate Change: (3K)    Objective:  Vital signs in last 24 hours:  Temp:  [99.3 F (37.4 C)-99.5 F (37.5 C)] 99.5 F (37.5 C) (02/24 0748) Pulse Rate:  [93-101] 101 (02/24 1045) Resp:  [14-37] 30 (02/24 1045) BP: (137-171)/(50-66) 149/57 (02/24 1045) SpO2:  [82 %-94 %] 92 % (02/24 1045) Weight:  [85.7 kg] 85.7 kg (02/24 0500)  Weight change:  Filed Weights   09/09/19 0500 09/10/19 0344 09/13/19 0500  Weight: 83.7 kg 85.7 kg 85.7 kg    Intake/Output: I/O last 3 completed shifts: In: 3790 [I.V.:6; NG/GT:840; IV Piggyback:170] Out: -    Intake/Output this shift:  No intake/output data recorded.  Physical Exam: General: Laying in bed  Head: [place]/AT  Eyes: Anicteric  Neck: Supple, trachea midline  Lungs:  clear  Heart: regular  Abdomen:  Obese, soft  Extremities: trace peripheral edema.  Neurologic: Not alert or oriented.   Skin: No lesions  Access: Left upper extremity AV graft    Basic Metabolic Panel: Recent Labs  Lab 09/07/19 0529 09/07/19 0529 09/07/19 1105 09/07/19 1655 09/08/19 0547 09/08/19 0547 09/08/19 1601 09/08/19 1601 09/09/19 0433 09/09/19 0433 09/10/19 0420 09/11/19 0832 09/12/19 0630 09/13/19 0737  NA 136  --   --    < > 135  134*   < > 135  --  135  --  136 137  --  140  K 2.9*   < > 4.2   < > 3.7  3.7   < > 4.0  --  4.9  --  4.6 4.0  --  5.1  CL 95*  --   --    < > 95*  94*   < > 96*  --  99  --  100 101  --  101  CO2 25  --   --    < > 29  28   <  > 30  --  25  --  24 21*  --  23  GLUCOSE 122*  --   --    < > 127*  128*   < > 137*  --  115*  --  115* 101*  --  177*  BUN 49*  --   --    < > 43*  47*   < > 30*  --  24*  --  49* 89*  --  88*  CREATININE 10.09*  --   --    < > 7.20*  7.16*   < > 4.31*  --  3.45*  --  4.62* 7.36*  --  7.33*  CALCIUM 7.4*  --   --    < > 7.7*  7.7*   < > 7.6*   < > 8.2*   < > 8.2* 8.2*  --  8.2*  MG 1.8  --  2.3  --  2.3  --   --   --  2.5*  --   --   --  2.5*  --   PHOS 8.9*  --   --    < >  6.4*  6.5*  --  3.6  --  3.6  --   --  4.4 1.9*  --    < > = values in this interval not displayed.    Liver Function Tests: Recent Labs  Lab 09/08/19 0547 09/08/19 1601 09/09/19 0433 09/11/19 0832 09/13/19 0737  AST  --   --   --   --  50*  ALT  --   --   --   --  13  ALKPHOS  --   --   --   --  67  BILITOT  --   --   --   --  0.5  PROT  --   --   --   --  7.3  ALBUMIN 2.4* 2.5* 2.4* 2.3* 2.2*   No results for input(s): LIPASE, AMYLASE in the last 168 hours. Recent Labs  Lab 09/12/19 1704  AMMONIA 24    CBC: Recent Labs  Lab 09/09/19 0433 09/10/19 0420 09/11/19 0832 09/12/19 1021 09/13/19 0835  WBC 18.3* 12.9* 14.0* 13.9* 16.4*  NEUTROABS 17.0*  --   --   --   --   HGB 10.9* 8.7* 8.7* 10.4* 10.1*  HCT 35.0* 27.8* 27.4* 32.4* 32.4*  MCV 100.9* 100.0 98.2 98.2 100.3*  PLT 120* 137* 189 247 325    Cardiac Enzymes: No results for input(s): CKTOTAL, CKMB, CKMBINDEX, TROPONINI in the last 168 hours.  BNP: Invalid input(s): POCBNP  CBG: Recent Labs  Lab 09/12/19 1810 09/12/19 2003 09/13/19 0056 09/13/19 0337 09/13/19 0744  GLUCAP 140* 142* 142* 132* 164*    Microbiology: Results for orders placed or performed during the hospital encounter of 09/05/19  MRSA PCR Screening     Status: Abnormal   Collection Time: 09/06/19  7:50 AM   Specimen: Nasal Mucosa; Nasopharyngeal  Result Value Ref Range Status   MRSA by PCR POSITIVE (A) NEGATIVE Final    Comment:        The GeneXpert  MRSA Assay (FDA approved for NASAL specimens only), is one component of a comprehensive MRSA colonization surveillance program. It is not intended to diagnose MRSA infection nor to guide or monitor treatment for MRSA infections. RESULT CALLED TO, READ BACK BY AND VERIFIED WITH: MEGAN SHEFFIELD AT 0931 ON 09/06/2019 [place]. Performed at [facility], [address] [place], [place] [address]     Coagulation Studies: No results for input(s): LABPROT, INR in the last 72 hours.  Urinalysis: No results for input(s): COLORURINE, LABSPEC, PHURINE, GLUCOSEU, HGBUR, BILIRUBINUR, KETONESUR, PROTEINUR, UROBILINOGEN, NITRITE, LEUKOCYTESUR in the last 72 hours.  Invalid input(s): APPERANCEUR    Imaging: DG Chest Port 1 View  Result Date: 09/13/2019 CLINICAL DATA:  NG tube placement EXAM: PORTABLE CHEST 1 VIEW COMPARISON:  09/06/2019 FINDINGS: Mild cardiomegaly. Left brachiocephalic and axillary vascular stents. No esophagogastric tube or other support apparatus are identified internal to the patient. Mild, diffuse interstitial pulmonary opacity. IMPRESSION: 1. No esophagogastric tube or other support apparatus identified internal to the patient, presumably malpositioned and coiled within the oropharynx. 2. Mild cardiomegaly and mild, diffuse interstitial pulmonary opacity, unchanged compared to prior examination. No new airspace opacity. These results will be called to the ordering clinician or representative by the Radiologist Assistant, and communication documented in the PACS or zVision Dashboard. Electronically Signed   By: Eddie Candle M.D.   On: 09/13/2019 09:17   DG Abd Portable 1V  Result Date: 09/13/2019 CLINICAL DATA:  Nasogastric tube placement. EXAM: PORTABLE ABDOMEN - 1 VIEW COMPARISON:  September 09, 2019. FINDINGS: The bowel gas pattern is normal. Nasogastric tube is not visualized. No radio-opaque calculi or other significant radiographic abnormality are seen. IMPRESSION:  Nasogastric tube is not visualized. No evidence of bowel obstruction or ileus. Electronically Signed   By: Marijo Conception M.D.   On: 09/13/2019 10:34     Medications:   . sodium chloride    . ampicillin-sulbactam (UNASYN) IV Stopped (09/13/19 0033)  . lacosamide (VIMPAT) IV 100 mg (09/13/19 0034)   . ascorbic acid  500 mg Oral BID  . atorvastatin  40 mg Oral QPM  . B-complex with vitamin C  1 tablet Per Tube QHS  . Chlorhexidine Gluconate Cloth  6 each Topical Q0600  . dextrose  1 ampule Intravenous Once  . docusate  100 mg Oral Daily  . epoetin (EPOGEN/PROCRIT) injection  10,000 Units Intravenous Q M,W,F-HD  . feeding supplement (NEPRO CARB STEADY)  1,000 mL Per Tube Q24H  . feeding supplement (PRO-STAT SUGAR FREE 64)  30 mL Per Tube BID  . ferrous sulfate  325 mg Oral QPC breakfast  . free water  30 mL Per Tube Q4H  . heparin injection (subcutaneous)  5,000 Units Subcutaneous Q12H  . mouth rinse  15 mL Mouth Rinse BID  . mupirocin ointment  1 application Nasal BID  . pantoprazole sodium  40 mg Per Tube Daily  . sennosides  5 mL Per Tube Daily   acetaminophen, bisacodyl, hydrALAZINE, ipratropium-albuterol, ondansetron **OR** ondansetron (ZOFRAN) IV  Assessment/ Plan:    [name] is a 79 y.o. black female with end stage renal disease on hemodialysis, hypertension, diabetes mellitus type II, diabetic neuropathy, seizure disorder who was admitted for left hip repair and unfortunate developed seizure episode postoperatively and subsequently intubated. Required vasopressors and placed on CRRT.   [facility] MWF [facility] LUE AVG 78.5kg   1.  ESRD on HD MWF. Seen and examined on hemodialysis treatment.   2.  Acute respiratory failure: intubated for airway protection. Extubated.  - nasal canula  3.  Anemia of chronic kidney disease.   - EPO with HD treatment  4.  Secondary hyperparathyroidism.  Phosphorus low. Holding binders and cinacalcet  5. Hypertension:  149/57. Holding home blood pressure regimen of losartan and furosemide.    LOS: 8 Barbara [name] 2/24/202111:19 AM

## 2019-09-13 NOTE — Progress Notes (Signed)
Progress Note    Barbara [name]  HCW:[id number] DOB: 1941-05-06  DOA: 09/05/2019 PCP: [name], MD      Brief Narrative:    Medical records reviewed and are as summarized below:  Barbara [name] is an 79 y.o. female with medical history significant of hypertension, hyperlipidemia, diet-controlled diabetes, COPD, asthma, GERD, ESRD-HD (MWF), OSA, CAD, bladder cancer, iron deficiency anemia.  She was admitted by Dr. Kizzie Bane for lefttotal hip arthroplasty.  She developed acute respiratory distress after surgery.  She was subsequently transferred to the ICU for acute hypoxemic and hypercapnic respiratory failure.  Initially, she was treated with BiPAP.  However, she failed BiPAP therapy and required intubation and mechanical ventilation in the ICU.  She was also treated with IV antibiotics for aspiration pneumonia.  She was also seen by the nephrologist for end-stage renal disease.  She was initially placed on CRRT and then transition to hemodialysis. She required vasopressors for hypotension.  She was seen by the neurologist for altered mental status and seizure activity.  MRI brain did not show any acute stroke.  Neurologist recommended Vimpat and monitoring for seizure  She was also seen by the cardiologist because of bradycardia and sinus pauses on telemetry.  Per cardiologist, there was no indication for permanent pacemaker and he recommended a conservative approach.  She was extubated on 09/08/2019.  Her condition was slowly improving so she was was transferred to the medical floor for continued medical management.   Assessment/Plan:   Principal Problem:   Status post total hip replacement, left Active Problems:   Anemia in chronic kidney disease   COPD (chronic obstructive pulmonary disease) (HCC)   CAD in native artery   Benign essential HTN   Acid reflux   Seizure (HCC)   Type II diabetes mellitus with renal manifestations (HCC)   ESRD on dialysis ([place])   Aspiration  pneumonia (HCC)   Acute encephalopathy   Goals of care, counseling/discussion   Acute hypoxemic and hypercapnic respiratory failure: Resolved.  Extubated on 09/08/2019.  She is on 2 L/min oxygen via nasal cannula.Taper off oxygen as able. Patient was briefly on NRB while she was at HD on 09/13/2019 but that is likely from coiling of the NG tube.  Aspiration pneumonia: Initially treated with IV cefepime, later on transition to IV Unasyn.  Given that the patient apparently has another episode of aspiration on morning of 09/13/2019 with fever we will transition her back to IV Zosyn.  Acute toxic metabolic encephalopathy: Continue Vimpat.  Chart review shows that patient has had similar episodes of encephalopathy current several days to recover. Seizure precaution. Appreciate neurology assistance.  EEG unremarkable. Avoid cefepime.  Seizures: Continue Vimpat, neuro c/s  NG tube malposition. This morning patient had episodes of coughing while she was receiving her medication. X-ray abdomen showed no evidence of NG tube in place. X-ray chest was also not able to see the NG tube. NG tube was removed as it was found to be coiling up in oropharynx. Replacement of the NG tube was not successful by bedside RN. IR requested for NG tube insertion. Continue to monitor for hypoglycemia for now.  ESRD on hemodialysis: Follow-up with nephrologist for hemodialysis. Schedule days are Mondays, Wednesdays and Fridays.  Chronic diastolic CHF: Hemodialysis for fluid management.  2D echo on 09/07/2019 showed EF estimated at 60 to 17%, grade 1 diastolic dysfunction.  Dysphagia: Consult dietitian for enteral nutrition.  Consult speech therapist for swallow evaluation. - continue NGT for now  and TFs  Leukocytosis and thrombocytopenia: Improving.  Bradycardia: Improved  Suspected ileus: Resolved  MRSA colonization of nares: Completed Bactroban   Body mass index is 29.93 kg/m.  (Obesity)  Overall poor  prognosis. Will c/s palliative care   Family Communication/Anticipated D/C date and plan/Code Status   DVT prophylaxis: Heparin Code Status: DNR Family Communication: Family at bedside.  We will update daughter tomorrow.  Disposition Plan: Still has fever.  Difficulty swallowing.  Need to figure out safe discharge plan for this patient.  Palliative care is also on board.  Discharge to be determined.    Subjective:   Does appear to have some dysarthria.  No nausea no vomiting.  Had some cough.  Fever later this afternoon.  Objective:    Vitals:   09/13/19 1355 09/13/19 1441 09/13/19 1558 09/13/19 1826  BP: (!) 123/40  (!) 133/48 (!) 112/53  Pulse: (!) 109 (!) 109 (!) 105 96  Resp:   20 20  Temp: 98.7 F (37.1 C)  (!) 101.2 F (38.4 C) 98.7 F (37.1 C)  TempSrc: Oral  Oral   SpO2: 100% 95% 92% 97%  Weight:      Height:        Intake/Output Summary (Last 24 hours) at 09/13/2019 1923 Last data filed at 09/13/2019 1250 Gross per 24 hour  Intake --  Output 1401 ml  Net -1401 ml   Filed Weights   09/13/19 0500 09/13/19 1000 09/13/19 1300  Weight: 85.7 kg 85.7 kg 84.1 kg    ROS: Unable to obtain due to her current mental status  Exam:  General: Appear in moderate distress, no Rash; Oral Mucosa Clear, dry. no Abnormal Mass Or lumps Cardiovascular: S1 and S2 Present, no Murmur, Respiratory: Increased respiratory effort, Bilateral Air entry present and bilateral  Crackles, no wheezes Abdomen: Bowel Sound present, Soft and no tenderness, no hernia Extremities: no Pedal edema, difficult to assess calf tenderness Neurology: alert and oriented to place and person affect anxious.     Data Reviewed:   I have personally reviewed following labs and imaging studies:  Labs: Labs show the following:   Basic Metabolic Panel: Recent Labs  Lab 09/07/19 0529 09/07/19 0529 09/07/19 1105 09/07/19 1655 09/08/19 0547 09/08/19 0547 09/08/19 1601 09/08/19 1601  09/09/19 0433 09/09/19 4270 09/10/19 0420 09/10/19 0420 09/11/19 6237 09/11/19 6283 09/12/19 0630 09/13/19 0737 09/13/19 1217  NA 136  --   --    < > 135  134*   < > 135   < > 135  --  136  --  137  --   --  140 141  K 2.9*   < > 4.2   < > 3.7  3.7   < > 4.0   < > 4.9   < > 4.6   < > 4.0   < >  --  5.1 3.9  CL 95*  --   --    < > 95*  94*   < > 96*   < > 99  --  100  --  101  --   --  101 100  CO2 25  --   --    < > 29  28   < > 30   < > 25  --  24  --  21*  --   --  23 25  GLUCOSE 122*  --   --    < > 127*  128*   < > 137*   < >  115*  --  115*  --  101*  --   --  177* 144*  BUN 49*  --   --    < > 43*  47*   < > 30*   < > 24*  --  49*  --  89*  --   --  88* 88*  CREATININE 10.09*  --   --    < > 7.20*  7.16*   < > 4.31*   < > 3.45*  --  4.62*  --  7.36*  --   --  7.33* 7.46*  CALCIUM 7.4*  --   --    < > 7.7*  7.7*   < > 7.6*   < > 8.2*  --  8.2*  --  8.2*  --   --  8.2* 8.2*  MG 1.8  --  2.3  --  2.3  --   --   --  2.5*  --   --   --   --   --  2.5*  --   --   PHOS 8.9*  --   --    < > 6.4*  6.5*   < > 3.6  --  3.6  --   --   --  4.4  --  1.9*  --  2.3*   < > = values in this interval not displayed.   GFR Estimated Creatinine Clearance: 6.7 mL/min (A) (by C-G formula based on SCr of 7.46 mg/dL (H)). Liver Function Tests: Recent Labs  Lab 09/08/19 1601 09/09/19 0433 09/11/19 0832 09/13/19 0737 09/13/19 1217  AST  --   --   --  50*  --   ALT  --   --   --  13  --   ALKPHOS  --   --   --  67  --   BILITOT  --   --   --  0.5  --   PROT  --   --   --  7.3  --   ALBUMIN 2.5* 2.4* 2.3* 2.2* 2.3*   No results for input(s): LIPASE, AMYLASE in the last 168 hours. Recent Labs  Lab 09/12/19 1704  AMMONIA 24   Coagulation profile No results for input(s): INR, PROTIME in the last 168 hours.  CBC: Recent Labs  Lab 09/09/19 0433 09/09/19 0433 09/10/19 0420 09/11/19 0832 09/12/19 1021 09/13/19 0835 09/13/19 1217  WBC 18.3*   < > 12.9* 14.0* 13.9* 16.4* 17.1*   NEUTROABS 17.0*  --   --   --   --   --   --   HGB 10.9*   < > 8.7* 8.7* 10.4* 10.1* 9.6*  HCT 35.0*   < > 27.8* 27.4* 32.4* 32.4* 30.9*  MCV 100.9*   < > 100.0 98.2 98.2 100.3* 99.7  PLT 120*   < > 137* 189 247 325 343   < > = values in this interval not displayed.   Cardiac Enzymes: No results for input(s): CKTOTAL, CKMB, CKMBINDEX, TROPONINI in the last 168 hours. BNP (last 3 results) No results for input(s): PROBNP in the last 8760 hours. CBG: Recent Labs  Lab 09/12/19 2003 09/13/19 0056 09/13/19 0337 09/13/19 0744 09/13/19 1659  GLUCAP 142* 142* 132* 164* 143*   D-Dimer: No results for input(s): DDIMER in the last 72 hours. Hgb A1c: No results for input(s): HGBA1C in the last 72 hours. Lipid Profile: No results for input(s): CHOL, HDL, LDLCALC, TRIG, CHOLHDL, LDLDIRECT in the  last 72 hours. Thyroid function studies: No results for input(s): TSH, T4TOTAL, T3FREE, THYROIDAB in the last 72 hours.  Invalid input(s): FREET3 Anemia work up: Recent Labs    09/12/19 1900  VITAMINB12 478   Sepsis Labs: Recent Labs  Lab 09/11/19 0832 09/12/19 1021 09/13/19 0835 09/13/19 1217  WBC 14.0* 13.9* 16.4* 17.1*    Microbiology Recent Results (from the past 240 hour(s))  MRSA PCR Screening     Status: Abnormal   Collection Time: 09/06/19  7:50 AM   Specimen: Nasal Mucosa; Nasopharyngeal  Result Value Ref Range Status   MRSA by PCR POSITIVE (A) NEGATIVE Final    Comment:        The GeneXpert MRSA Assay (FDA approved for NASAL specimens only), is one component of a comprehensive MRSA colonization surveillance program. It is not intended to diagnose MRSA infection nor to guide or monitor treatment for MRSA infections. RESULT CALLED TO, READ BACK BY AND VERIFIED WITH: MEGAN SHEFFIELD AT 0931 ON 09/06/2019 [place]. Performed at [facility], [address] [place], [place] [address]     Procedures and diagnostic studies:  EEG  Result Date:  09/13/2019 Lora Havens, MD     09/13/2019  5:50 PM Patient Name: Barbara [name] MRN: [id number] Epilepsy Attending: Lora Havens Referring Physician/Provider: Dr Karena Addison Aroor Date: 09/13/2019 Duration: 29.14 mins Patient history: 79 y.o.femalewith multiple medical problems including ESRD on hemodialysis who was admitted 2/16 for left total hip arthroplasty.Neurology reconsulted for worsening speech and mental status. Level of alertness: awake AEDs during EEG study: Vimpat Technical aspects: This EEG study was done with scalp electrodes positioned according to the 10-20 International system of electrode placement. Electrical activity was acquired at a sampling rate of 500Hz  and reviewed with a high frequency filter of 70Hz  and a low frequency filter of 1Hz . EEG data were recorded continuously and digitally stored. DESCRIPTION: When patient appeared to be resting/drowsy, EEG showed continuous generalized 3-6hz  theta-delta slowing. When patient was awake/stimulated, EEG showed triphasic waves, generalized, maximal bifrontal at times at 1-1.5hz . Sharp transients were seen in left frontocentral region. Hyperventilation and photic stimulation were not performed. ABNORMALITY - Continuous slow, generalized - Triphasic waves, generalized, maximal bifrontal IMPRESSION: This study is suggestive of moderate to severe diffuse encephalopathy, non specific to etiology but could be secondary to toxic-metabolic causes. No seizures or definite  epileptiform discharges were seen throughout the recording. Lora Havens   DG Chest Port 1 View  Result Date: 09/13/2019 CLINICAL DATA:  Shortness of breath. EXAM: PORTABLE CHEST 1 VIEW COMPARISON:  Radiographs 09/13/2019 and 09/06/2019. CT 08/24/2019. FINDINGS: 1435 hours. The heart size and mediastinal contours are stable with a stable left subclavian venous stent. There are persistent low lung volumes with worsening right-greater-than-left airspace opacities, suspicious  for pneumonia, possibly on the basis of aspiration. No edema or significant pleural effusion. The bones appear unchanged. IMPRESSION: Worsening right-greater-than-left basilar airspace opacities suspicious for pneumonia, possibly on the basis of aspiration. Electronically Signed   By: Richardean Sale M.D.   On: 09/13/2019 16:56   DG Chest Port 1 View  Result Date: 09/13/2019 CLINICAL DATA:  NG tube placement EXAM: PORTABLE CHEST 1 VIEW COMPARISON:  09/06/2019 FINDINGS: Mild cardiomegaly. Left brachiocephalic and axillary vascular stents. No esophagogastric tube or other support apparatus are identified internal to the patient. Mild, diffuse interstitial pulmonary opacity. IMPRESSION: 1. No esophagogastric tube or other support apparatus identified internal to the patient, presumably malpositioned and coiled within the oropharynx. 2. Mild cardiomegaly and  mild, diffuse interstitial pulmonary opacity, unchanged compared to prior examination. No new airspace opacity. These results will be called to the ordering clinician or representative by the Radiologist Assistant, and communication documented in the PACS or zVision Dashboard. Electronically Signed   By: Eddie Candle M.D.   On: 09/13/2019 09:17   DG Abd Portable 1V  Result Date: 09/13/2019 CLINICAL DATA:  Nasogastric tube placement. EXAM: PORTABLE ABDOMEN - 1 VIEW COMPARISON:  September 09, 2019. FINDINGS: The bowel gas pattern is normal. Nasogastric tube is not visualized. No radio-opaque calculi or other significant radiographic abnormality are seen. IMPRESSION: Nasogastric tube is not visualized. No evidence of bowel obstruction or ileus. Electronically Signed   By: Marijo Conception M.D.   On: 09/13/2019 10:34    Medications:   . Chlorhexidine Gluconate Cloth  6 each Topical Q0600  . epoetin (EPOGEN/PROCRIT) injection  10,000 Units Intravenous Q M,W,F-HD  . heparin injection (subcutaneous)  5,000 Units Subcutaneous Q12H  . mouth rinse  15 mL Mouth  Rinse BID  . mupirocin ointment  1 application Nasal BID   Continuous Infusions: . sodium chloride    . famotidine (PEPCID) IV 20 mg (09/13/19 1823)  . lacosamide (VIMPAT) IV 100 mg (09/13/19 1437)  . piperacillin-tazobactam (ZOSYN)  IV       LOS: 8 days   Berle Mull  [facility]     09/13/2019, 7:23 PM

## 2019-09-13 NOTE — Progress Notes (Signed)
Pre HD Assessment    09/13/19 0945  Neurological  Level of Consciousness Alert  Orientation Level Oriented to person  Respiratory  Respiratory Pattern Regular;Unlabored  Chest Assessment Chest expansion symmetrical  Bilateral Breath Sounds Diminished  Cough None  Cardiac  Pulse Regular  Heart Sounds S1, S2  ECG Monitor Yes  Cardiac Rhythm NSR  Vascular  R Radial Pulse +2  L Radial Pulse +2  Edema Generalized  Psychosocial  Psychosocial (WDL) WDL  Patient Behaviors Calm;Not interactive  Emotional support given Given to patient

## 2019-09-13 NOTE — Progress Notes (Addendum)
Reason for consult:  Altered mental status   Subjective: Patient was in dialysis for most of the day, just the sister as she got back. Patient is lethargic, not following any commands.  Tracks examiner is nonverbal   ROS:  Unable to obtain due to poor mental status  Examination  Vital signs in last 24 hours: Temp:  [98.7 F (37.1 C)-101.2 F (38.4 C)] 101.2 F (38.4 C) (02/24 1558) Pulse Rate:  [93-111] 105 (02/24 1558) Resp:  [14-37] 20 (02/24 1558) BP: (119-171)/(40-66) 133/48 (02/24 1558) SpO2:  [82 %-100 %] 92 % (02/24 1558) Weight:  [84.1 kg-85.7 kg] 84.1 kg (02/24 1300)  General: lying in bed CVS: pulse-normal rate and rhythm RS: breathing comfortably Extremities: normal   Neuro: MS: Lethargic, opens eyes to sternal rub, tracks examiner across the room but does not follow any commands. CN: pupils equal and reactive,  EOMI, face symmetric, tongue midline, Motor: Withdraws in all 4 extremities, moves left forearm against gravity Coordination: Not tested l Gait: not tested  Basic Metabolic Panel: Recent Labs  Lab 09/07/19 0529 09/07/19 0529 09/07/19 1105 09/07/19 1655 09/08/19 0547 09/08/19 0547 09/08/19 1601 09/08/19 1601 09/09/19 0433 09/09/19 0433 09/10/19 0420 09/10/19 0420 09/11/19 0832 09/12/19 0630 09/13/19 0737 09/13/19 1217  NA 136  --   --    < > 135  134*   < > 135   < > 135  --  136  --  137  --  140 141  K 2.9*   < > 4.2   < > 3.7  3.7   < > 4.0   < > 4.9  --  4.6  --  4.0  --  5.1 3.9  CL 95*  --   --    < > 95*  94*   < > 96*   < > 99  --  100  --  101  --  101 100  CO2 25  --   --    < > 29  28   < > 30   < > 25  --  24  --  21*  --  23 25  GLUCOSE 122*  --   --    < > 127*  128*   < > 137*   < > 115*  --  115*  --  101*  --  177* 144*  BUN 49*  --   --    < > 43*  47*   < > 30*   < > 24*  --  49*  --  89*  --  88* 88*  CREATININE 10.09*  --   --    < > 7.20*  7.16*   < > 4.31*   < > 3.45*  --  4.62*  --  7.36*  --  7.33* 7.46*   CALCIUM 7.4*  --   --    < > 7.7*  7.7*   < > 7.6*   < > 8.2*   < > 8.2*   < > 8.2*  --  8.2* 8.2*  MG 1.8  --  2.3  --  2.3  --   --   --  2.5*  --   --   --   --  2.5*  --   --   PHOS 8.9*  --   --    < > 6.4*  6.5*   < > 3.6  --  3.6  --   --   --  4.4 1.9*  --  2.3*   < > = values in this interval not displayed.    CBC: Recent Labs  Lab 09/09/19 0433 09/09/19 0433 09/10/19 0420 09/11/19 0832 09/12/19 1021 09/13/19 0835 09/13/19 1217  WBC 18.3*   < > 12.9* 14.0* 13.9* 16.4* 17.1*  NEUTROABS 17.0*  --   --   --   --   --   --   HGB 10.9*   < > 8.7* 8.7* 10.4* 10.1* 9.6*  HCT 35.0*   < > 27.8* 27.4* 32.4* 32.4* 30.9*  MCV 100.9*   < > 100.0 98.2 98.2 100.3* 99.7  PLT 120*   < > 137* 189 247 325 343   < > = values in this interval not displayed.     Coagulation Studies: No results for input(s): LABPROT, INR in the last 72 hours.  ASSESSMENT AND PLAN  79 y.o.femalewith multiple medical problems including ESRD on hemodialysis who was admitted 2/16 for left total hip arthroplasty. Developed severe metabolic acidosis and encephalopathy on 2/17 and neurology consulted.EEG showed discontinuous general background slowing with rare, superimposed, generalized polyspike activity with frontal predominance. Was started on Vimpat due to suspicion of seizures. MRI Brain showed no acute changes.  Per Dr. Doy Mince, the patient has had episodes in the past when she became unresponsive with illnesses and required extensive amount of time for recovery.  Neurology reconsulted for worsening speech and mental status. Per neurology note on 09/09/19 she was not verbal, not following commands. Per hospitalist note, patient lethargic, only oriented to person.     Ammonia level : normal  TSH abnormal :  B12: 3 years ago was 311 BUN : 88 on 09/17/19 ABG; pC02 36  Acute metabolic encephalopathy - ? uremia, sepsis,  Generalized weakness likely from chronic deconditioning    Recommendation Nothing by mouth due to AMS- please administer meds through NG tube  [id number] Continue unasyn, avoid cefepime  Repeat EEG ordered: Results pending Continue Vimpat  Seizure precautions Agree with palliative consult  Addendum Spoke with Dr. Hortense Ramal regarding EEG at 6 pm: suggestive of severe encephalopathy . Triphasic waves, generalized, maximal bifrontal were noted   Karena Addison Dae Highley [facility] Pager Number [phone] For questions after 7pm please refer to AMION to reach the Neurologist on call

## 2019-09-13 NOTE — Progress Notes (Signed)
SLP Cancellation Note  Patient Details Name: Barbara [name] MRN: [id number] DOB: 1941-01-02   Cancelled treatment:       Reason Eval/Treat Not Completed: Patient not medically ready;Patient's level of consciousness;Medical issues which prohibited therapy(chart reviewed; consulted NSG re: pt's status). Per NSG report, pt exhibited regurgitation and coughing of TFs -- NSG concerned re: placement of NG. Pt alerts to name, mumbled speech of 2-4 words w/ reduced intelligibility d/t decreased effort and energy for speech. Mod+ cues given to engage simple responses. Noted increased respiratory rate and effort at rest in bed. CXR being done.  Due to pt's overall Cognitive decline (and decline in functional abilities/nature from her reported baseline at home), recommend continue w/ TFs via NG for optimal nutritional support in hopes pt will improve in her alertness to be able to functionally and safely engage in oral intake/meals again and meet nutritional needs orally. At this time, pt is Not safe for oral intake at the level of  full meals d/t the required energy/exertion and attention to such po tasks. Recommend continue w/ Pleasure sips of Nectar liquids via TSP for oral stimulation of swallowing; oral care for hygiene frequently during the day. NSG and Palliative Care consulted today re: above.      [name], MS, CCC-SLP Watson,Katherine 09/13/2019, 2:15 PM

## 2019-09-13 NOTE — Progress Notes (Signed)
eeg completed ° °

## 2019-09-13 NOTE — Consult Note (Signed)
Consultation Note Date: 09/13/2019   Patient Name: Barbara [name]  DOB: Dec 09, 1940  MRN: [id number]  Age / Sex: 79 y.o., female  PCP: [name], MD Referring Physician: Lavina Hamman, MD  Reason for Consultation: Establishing goals of care  HPI/Patient Profile: 79 y.o. female  with past medical history of HTN, HLD, DM, COPD, GERD, ESRD on HD, OSA, CAD, and bladder cancer admitted on 09/05/2019 for left total hip arthroplasty. Post - op she developed respiratory failure and required intubation in ICU on 2/17. Also, some seizure like activity noted. She was extubated 2/19. She is being treated with IV antibiotics for aspiration pneumonia. Apparently patient has had several episodes of acute metabolic encephalopathy and takes several days-weeks to recover. She has continued HD during hospitalizations. Patient is unable to meet nutritional requirements due to encephalopathy and has NG tube for feeding. PMT consulted for [place].   Clinical Assessment and Goals of Care: I have reviewed medical records including EPIC notes, labs and imaging, received report from RN and SLP, assessed the patient and then met with patient and spouse, Jeneen Rinks, to discuss diagnosis prognosis, GOC, EOL wishes, disposition and options.  I offered to call patient's daughter Parke Simmers to include her in conversation but spouse tells me not to as he feels like Parke Simmers has enough going on - tells me she has stage 4 cancer.   I introduced Palliative Medicine as specialized medical care for people living with serious illness. It focuses on providing relief from the symptoms and stress of a serious illness. The goal is to improve quality of life for both the patient and the family.  As far as functional and nutritional status, Jeneen Rinks tells me patient was able to care for herself independently prior to admission. He tells me she could bathe herself. Tells me she cooked her own meals and was even still  driving to the grocery store. He also tells me she had a great appetite. I clarified this with him multiple times as the way the patient presents is very far from this baseline. He does endorse some confusion at home but tells me she was still able to complete usual daily tasks.    We discussed her current illness and what it means in the larger context of her on-going co-morbidities. Discussed concerns about patient's mental status and ability to maintain PO intake. Spouse expresses understanding. He also shares patient has been this way many times before and always improves drastically after several days-weeks back to her baseline.   Patient was changed to DNR by ICU MD - not readdressed.  Because of patient's history of similar situations and full recovery family would like to proceed with "watchful waiting" and see how patient does in coming days. Family agrees to continued support of palliative care.   Questions and concerns were addressed. The family was encouraged to call with questions or concerns.   Primary Decision Maker NEXT OF KIN    SUMMARY OF RECOMMENDATIONS   - "watchful waiting" - d/t patient's history of similar situations with what family calls a full recovery they would like to continue current care and see how patient responds - agreeable for PMT to follow along with them - spouse asked that I not call daughter as she has her own health concerns  Code Status/Advance Care Planning:  DNR   Symptom Management:   Per primary  Prognosis:   Unable to determine  Discharge Planning: [place] for rehab with Palliative care service follow-up ?? TBD  Primary Diagnoses: Present on Admission: . Type II diabetes mellitus with renal manifestations ([place]) . COPD (chronic obstructive pulmonary disease) ([place]) . CAD in native artery . Anemia in chronic kidney disease . Acid reflux . Benign essential HTN   I have reviewed the medical record, interviewed  the patient and family, and examined the patient. The following aspects are pertinent.  Past Medical History:  Diagnosis Date  . Adult celiac disease   . Anemia associated with chronic renal failure 2017   blood transfusion last week 10/17  . Arthritis   . Asthma   . Cancer ([place]) 2017   bladder  . Chronic kidney disease   . 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  dr. Candiss Norse and dr. Holley Raring follow her  . COPD (chronic obstructive pulmonary disease) ([place])   . Coronary artery disease   . Diabetes mellitus without complication ([place])   . Dialysis patient ([place])    Tues, Thurs, Sat  . Dyspnea    with exertion  . Elevated lipids   . GERD (gastroesophageal reflux disease)   . Hematuria   . Hypertension   . Lower back pain   . Neuropathy   . Oxygen dependent    at hs  . Personal history of chemotherapy 2017   bladder ca  . Personal history of radiation therapy 2017   bladder ca  . Sleep apnea    uses cpap  . Urinary obstruction 01/2016   Social History   Socioeconomic History  . Marital status: Married    Spouse name: Not on file  . Number of children: Not on file  . Years of education: Not on file  . Highest education level: Not on file  Occupational History  . Occupation: retired  Tobacco Use  . Smoking status: Former Smoker    Types: Cigarettes    Quit date: 07/20/2002    Years since quitting: 17.1  . Smokeless tobacco: Never Used  . Tobacco comment: 01/22/2016   "  quit smoking many years ago "  Substance and Sexual Activity  . Alcohol use: No  . Drug use: No  . Sexual activity: Never  Other Topics Concern  . Not on file  Social History Narrative  . Not on file   Social Determinants of Health   Financial Resource Strain:   . Difficulty of Paying Living Expenses: Not on file  Food Insecurity:   . Worried About Charity fundraiser in the Last Year: Not on file  . Ran Out of Food in the Last Year: Not on file  Transportation Needs:   . Lack  of Transportation (Medical): Not on file  . Lack of Transportation (Non-Medical): Not on file  Physical Activity:   . Days of Exercise per Week: Not on file  . Minutes of Exercise per Session: Not on file  Stress:   . Feeling of Stress : Not on file  Social Connections:   . Frequency of Communication with Friends and Family: Not on file  . Frequency of Social Gatherings with Friends and Family: Not on file  . Attends Religious Services: Not on file  . Active Member of Clubs or Organizations: Not on file  . Attends Archivist Meetings: Not on file  . Marital Status: Not on file   Family History  Problem Relation Age of Onset  . Diabetes Mother   . Cancer Father   . Breast cancer Neg Hx   . Kidney disease Neg  Hx    Scheduled Meds: . ascorbic acid  500 mg Oral BID  . atorvastatin  40 mg Oral QPM  . B-complex with vitamin C  1 tablet Per Tube QHS  . Chlorhexidine Gluconate Cloth  6 each Topical Q0600  . docusate  100 mg Oral Daily  . epoetin (EPOGEN/PROCRIT) injection  10,000 Units Intravenous Q M,W,F-HD  . feeding supplement (NEPRO CARB STEADY)  1,000 mL Per Tube Q24H  . feeding supplement (PRO-STAT SUGAR FREE 64)  30 mL Per Tube BID  . ferrous sulfate  325 mg Oral QPC breakfast  . free water  30 mL Per Tube Q4H  . heparin injection (subcutaneous)  5,000 Units Subcutaneous Q12H  . mouth rinse  15 mL Mouth Rinse BID  . mupirocin ointment  1 application Nasal BID  . pantoprazole sodium  40 mg Per Tube Daily  . sennosides  5 mL Per Tube Daily   Continuous Infusions: . sodium chloride    . ampicillin-sulbactam (UNASYN) IV Stopped (09/13/19 0033)  . lacosamide (VIMPAT) IV 100 mg (09/13/19 0034)   PRN Meds:.acetaminophen, bisacodyl, hydrALAZINE, ipratropium-albuterol, ondansetron **OR** ondansetron (ZOFRAN) IV Allergies  Allergen Reactions  . Glipizide Er Other (See Comments)    Hypoglycemia   . Percocet [Oxycodone-Acetaminophen] Hives   Review of Systems  Unable  to perform ROS: Mental status change    Physical Exam Constitutional:      Appearance: She is ill-appearing.  Cardiovascular:     Rate and Rhythm: Tachycardia present.  Pulmonary:     Effort: Tachypnea present. No accessory muscle usage.  Skin:    General: Skin is warm and dry.  Neurological:     Mental Status: She is lethargic and disoriented.     Motor: Weakness present.     Vital Signs: BP (!) 123/40 (BP Location: Right Arm)   Pulse (!) 109   Temp 98.7 F (37.1 C) (Oral)   Resp (!) 34   Ht _0  (1.676 m)   Wt 84.1 kg   SpO2 100%   BMI 29.93 kg/m  Pain Scale: PAINAD   Pain Score: 0-No pain   SpO2: SpO2: 100 % O2 Device:SpO2: 100 % O2 Flow Rate: .O2 Flow Rate (L/min): 2 L/min  IO: Intake/output summary:   Intake/Output Summary (Last 24 hours) at 09/13/2019 1421 Last data filed at 09/13/2019 1250 Gross per 24 hour  Intake --  Output 1401 ml  Net -1401 ml    LBM: Last BM Date: 09/11/19 Baseline Weight: Weight: 84.9 kg Most recent weight: Weight: 84.1 kg     Palliative Assessment/Data:PPS 30%     Time Total: 70 minutes Greater than 50%  of this time was spent counseling and coordinating care related to the above assessment and plan.  Juel Burrow, DNP, AGNP-C Palliative Medicine Team [phone] Pager: [phone]

## 2019-09-13 NOTE — Consult Note (Addendum)
Pharmacy Antibiotic Note  Barbara [name] is a 79 y.o. female admitted on 09/05/2019 with aspiration pneumonia. Patient in hospital for left total hip arthroplasty and subsequently transferred to ICU on 6/38 for metabolic acidosis, encephalopathy, and severe respiratory distress requiring intubation. She was extubated 2/19  She has a history of ESRD on HD MWF. Pharmacy has been consulted for pip/tazo dosing for aspiration pneumonia..   Plan: Was on Ampicillin/sulbactam 3g IV every 12 hours switching to pip/tazo 2.25 mg q8H due to HD renal dosing.    Height: 5' 6"  (167.6 cm) Weight: 185 lb 6.5 oz (84.1 kg) IBW/kg (Calculated) : 59.3  Temp (24hrs), Avg:99.7 F (37.6 C), Min:98.7 F (37.1 C), Max:101.2 F (38.4 C)  Recent Labs  Lab 09/09/19 0433 09/09/19 0433 09/10/19 0420 09/11/19 0832 09/12/19 1021 09/13/19 0737 09/13/19 0835 09/13/19 1217  WBC 18.3*   < > 12.9* 14.0* 13.9*  --  16.4* 17.1*  CREATININE 3.45*  --  4.62* 7.36*  --  7.33*  --  7.46*   < > = values in this interval not displayed.    Estimated Creatinine Clearance: 6.7 mL/min (A) (by C-G formula based on SCr of 7.46 mg/dL (H)).    Allergies  Allergen Reactions  . Glipizide Er Other (See Comments)    Hypoglycemia   . Percocet [Oxycodone-Acetaminophen] Hives    Antimicrobials this admission: Cefazolin for surgical prophylaxis Ampicillin/sulbactam 2/20 >> 2/24 Pip/tazo 2/24 >>   Microbiology results: 2/17 MRSA PCR: Positive  Thank you for allowing pharmacy to be a part of this patient's care.  Oswald Hillock, PharmD, BCPS Clinical Pharmacist 09/13/2019 4:13 PM

## 2019-09-14 ENCOUNTER — Inpatient Hospital Stay: Payer: Medicare Other

## 2019-09-14 LAB — COMPREHENSIVE METABOLIC PANEL
ALT: 10 U/L (ref 0–44)
AST: 37 U/L (ref 15–41)
Albumin: 2.1 g/dL — ABNORMAL LOW (ref 3.5–5.0)
Alkaline Phosphatase: 63 U/L (ref 38–126)
Anion gap: 13 (ref 5–15)
BUN: 53 mg/dL — ABNORMAL HIGH (ref 8–23)
CO2: 30 mmol/L (ref 22–32)
Calcium: 8.5 mg/dL — ABNORMAL LOW (ref 8.9–10.3)
Chloride: 99 mmol/L (ref 98–111)
Creatinine, Ser: 5.56 mg/dL — ABNORMAL HIGH (ref 0.44–1.00)
GFR calc Af Amer: 8 mL/min — ABNORMAL LOW (ref >60)
GFR calc non Af Amer: 7 mL/min — ABNORMAL LOW (ref >60)
Glucose, Bld: 126 mg/dL — ABNORMAL HIGH (ref 70–99)
Potassium: 3.8 mmol/L (ref 3.5–5.1)
Sodium: 142 mmol/L (ref 135–145)
Total Bilirubin: 0.5 mg/dL (ref 0.3–1.2)
Total Protein: 6.9 g/dL (ref 6.5–8.1)

## 2019-09-14 LAB — GLUCOSE, CAPILLARY
Glucose-Capillary: 108 mg/dL — ABNORMAL HIGH (ref 70–99)
Glucose-Capillary: 109 mg/dL — ABNORMAL HIGH (ref 70–99)
Glucose-Capillary: 113 mg/dL — ABNORMAL HIGH (ref 70–99)
Glucose-Capillary: 119 mg/dL — ABNORMAL HIGH (ref 70–99)
Glucose-Capillary: 124 mg/dL — ABNORMAL HIGH (ref 70–99)
Glucose-Capillary: 133 mg/dL — ABNORMAL HIGH (ref 70–99)

## 2019-09-14 LAB — CBC WITH DIFFERENTIAL/PLATELET
Abs Immature Granulocytes: 0.32 10*3/uL — ABNORMAL HIGH (ref 0.00–0.07)
Basophils Absolute: 0.1 10*3/uL (ref 0.0–0.1)
Basophils Relative: 0 %
Eosinophils Absolute: 0.4 10*3/uL (ref 0.0–0.5)
Eosinophils Relative: 3 %
HCT: 29.9 % — ABNORMAL LOW (ref 36.0–46.0)
Hemoglobin: 9.1 g/dL — ABNORMAL LOW (ref 12.0–15.0)
Immature Granulocytes: 2 %
Lymphocytes Relative: 9 %
Lymphs Abs: 1.7 10*3/uL (ref 0.7–4.0)
MCH: 30.4 pg (ref 26.0–34.0)
MCHC: 30.4 g/dL (ref 30.0–36.0)
MCV: 100 fL (ref 80.0–100.0)
Monocytes Absolute: 1.2 10*3/uL — ABNORMAL HIGH (ref 0.1–1.0)
Monocytes Relative: 7 %
Neutro Abs: 14 10*3/uL — ABNORMAL HIGH (ref 1.7–7.7)
Neutrophils Relative %: 79 %
Platelets: 336 10*3/uL (ref 150–400)
RBC: 2.99 MIL/uL — ABNORMAL LOW (ref 3.87–5.11)
RDW: 14.8 % (ref 11.5–15.5)
WBC: 17.6 10*3/uL — ABNORMAL HIGH (ref 4.0–10.5)
nRBC: 0.3 % — ABNORMAL HIGH (ref 0.0–0.2)

## 2019-09-14 LAB — MAGNESIUM: Magnesium: 2.1 mg/dL (ref 1.7–2.4)

## 2019-09-14 LAB — PROCALCITONIN: Procalcitonin: 5.86 ng/mL

## 2019-09-14 MED ORDER — ACETAMINOPHEN 160 MG/5ML PO SOLN
325.0000 mg | Freq: Four times a day (QID) | ORAL | Status: DC | PRN
  Administered 2019-09-15 – 2019-09-23 (×3): 325 mg via ORAL
  Filled 2019-09-14 (×4): qty 20.3

## 2019-09-14 MED ORDER — PIPERACILLIN-TAZOBACTAM 3.375 G IVPB
3.3750 g | Freq: Two times a day (BID) | INTRAVENOUS | Status: AC
  Administered 2019-09-14 – 2019-09-19 (×10): 3.375 g via INTRAVENOUS
  Filled 2019-09-14 (×10): qty 50

## 2019-09-14 MED ORDER — PRO-STAT SUGAR FREE PO LIQD
30.0000 mL | Freq: Two times a day (BID) | ORAL | Status: DC
  Administered 2019-09-14 – 2019-09-23 (×15): 30 mL

## 2019-09-14 MED ORDER — CHLORHEXIDINE GLUCONATE 0.12 % MT SOLN
15.0000 mL | Freq: Four times a day (QID) | OROMUCOSAL | Status: DC
  Administered 2019-09-14 – 2019-09-24 (×26): 15 mL via OROMUCOSAL
  Filled 2019-09-14 (×25): qty 15

## 2019-09-14 MED ORDER — FREE WATER
50.0000 mL | Status: DC
  Administered 2019-09-14 – 2019-09-24 (×36): 50 mL

## 2019-09-14 MED ORDER — NEPRO/CARBSTEADY PO LIQD
1000.0000 mL | ORAL | Status: DC
  Administered 2019-09-14 – 2019-09-16 (×3): 1000 mL via ORAL
  Administered 2019-09-19: 14:00:00 237 mL via ORAL
  Administered 2019-09-21 – 2019-09-24 (×6): 1000 mL via ORAL

## 2019-09-14 MED ORDER — B COMPLEX-C PO TABS
1.0000 | ORAL_TABLET | Freq: Every day | ORAL | Status: DC
  Administered 2019-09-14 – 2019-09-23 (×7): 1
  Filled 2019-09-14 (×11): qty 1

## 2019-09-14 NOTE — Progress Notes (Signed)
PT Cancellation Note  Patient Details Name: METTE SOUTHGATE MRN: [id number] DOB: 06/03/1941   Cancelled Treatment:    Reason Eval/Treat Not Completed: Other (comment)(Speech at bedside, PT to follow up as able.)   Lieutenant Diego PT, DPT 11:23 AM,09/14/19

## 2019-09-14 NOTE — Progress Notes (Signed)
Progress Note    Barbara [name]  JYN:[id number] DOB: 1940/08/19  DOA: 09/05/2019 PCP: [name], MD      Brief Narrative:    Medical records reviewed and are as summarized below:  Barbara [name] is an 79 y.o. female with medical history significant of hypertension, hyperlipidemia, diet-controlled diabetes, COPD, asthma, GERD, ESRD-HD (MWF), OSA, CAD, bladder cancer, iron deficiency anemia.  She was admitted by Dr. Kizzie Bane for lefttotal hip arthroplasty.  She developed acute respiratory distress after surgery.  She was subsequently transferred to the ICU for acute hypoxemic and hypercapnic respiratory failure.  Initially, she was treated with BiPAP.  However, she failed BiPAP therapy and required intubation and mechanical ventilation in the ICU.  She was also treated with IV antibiotics for aspiration pneumonia.  She was also seen by the nephrologist for end-stage renal disease.  She was initially placed on CRRT and then transition to hemodialysis. She required vasopressors for hypotension.  She was seen by the neurologist for altered mental status and seizure activity.  MRI brain did not show any acute stroke.  Neurologist recommended Vimpat and monitoring for seizure  She was also seen by the cardiologist because of bradycardia and sinus pauses on telemetry.  Per cardiologist, there was no indication for permanent pacemaker and he recommended a conservative approach.  She was extubated on 09/08/2019.  Her condition was slowly improving so she was was transferred to the medical floor for continued medical management.   Assessment/Plan:   Principal Problem:   Status post total hip replacement, left Active Problems:   Anemia in chronic kidney disease   COPD (chronic obstructive pulmonary disease) (HCC)   CAD in native artery   Benign essential HTN   Acid reflux   Seizure (HCC)   Type II diabetes mellitus with renal manifestations (HCC)   ESRD on dialysis ([place])   Aspiration  pneumonia (HCC)   Acute encephalopathy   Goals of care, counseling/discussion   Acute hypoxemic and hypercapnic respiratory failure: Resolved.  Extubated on 09/08/2019.  She is currently on room air. Patient was briefly on NRB while she was at HD on 09/13/2019 but that is likely from coiling of the NG tube.  Aspiration pneumonia:  Initially treated with IV cefazolin, later on transition to IV Unasyn.  Cefazolin was discontinued due to persistent encephalopathy. Given that the patient apparently has another episode of aspiration on morning of 09/13/2019 with fever we transition her back to IV Zosyn. Continue antibiotics up to 230 2020.  Acute toxic metabolic encephalopathy: History of seizures Improving. Continue Vimpat.   Chart review shows that patient has had similar episodes of encephalopathy in the past and required several days to recover.  Continue seizure precaution. Appreciate neurology assistance.  EEG unremarkable. Avoid cephalosporin.  NG tube malposition.  On 09/13/2019. X-ray abdomen showed no evidence of NG tube in place. X-ray chest was also not able to see the NG tube. NG tube was removed as it was found to be coiling up in oropharynx. Replacement of the NG tube was not successful by bedside RN. IR requested for NG tube insertion. Continue to monitor for hypoglycemia for now. Now NG tube back in place and working well.  ESRD on hemodialysis:  Follow-up with nephrologist for hemodialysis.  Per neurology uremia can also contribute to patient's encephalopathy. Schedule days are Mondays, Wednesdays and Fridays.  Chronic diastolic CHF:  Hemodialysis for fluid management.   2D echo on 09/07/2019 showed EF estimated at 60 to 65%,  grade 1 diastolic dysfunction.  Dysphagia:  Likely from deconditioning as well as encephalopathy. Currently requiring tube feeds. Dietary following. Speech therapy also consulted.  Leukocytosis and  thrombocytopenia: Stable.  Bradycardia: Improved  Suspected ileus: Resolved  MRSA colonization of nares: Completed Bactroban  Body mass index is 29.36 kg/m.  (Obesity)  Overall poor prognosis.  Palliative care following.  Will monitor.   Family Communication/Anticipated D/C date and plan/Code Status   DVT prophylaxis: Heparin Code Status: DNR Family Communication: No family at bedside. Disposition:  Pt is from home, admitted with confusion and pneumonia, still is confused, fatigue and requiring IV antibiotics, also currently on tube feed and will require improvement in tolerance to oral diet prior to discharge, which precludes a safe discharge. Discharge to SNF, when when medically stable.     Subjective:   Improving mentation.  Able to tell me that she is in the hospital tell me her name.  No nausea no vomiting.  Denies any acute complaint.  Objective:    Vitals:   09/14/19 0146 09/14/19 0500 09/14/19 0818 09/14/19 1552  BP: (!) 109/48  (!) 116/53 (!) 130/44  Pulse: 91  84 88  Resp: 14  18 18   Temp: 99.5 F (37.5 C)  98.6 F (37 C) 99 F (37.2 C)  TempSrc: Oral     SpO2: 92%  91% 96%  Weight:  82.5 kg    Height:        Intake/Output Summary (Last 24 hours) at 09/14/2019 1832 Last data filed at 09/14/2019 1648 Gross per 24 hour  Intake 413.01 ml  Output --  Net 413.01 ml   Filed Weights   09/13/19 1000 09/13/19 1300 09/14/19 0500  Weight: 85.7 kg 84.1 kg 82.5 kg    ROS: Unable to obtain due to her current mental status  Exam:  General: alert and oriented to time and place. Appear in moderate distress, affect flat in affect Eyes: PERRL, Conjunctiva normal ENT: Oral Mucosa shows Poor dentition and hygiene  Neck: difficult to assess  JVD, no Abnormal Mass Or lumps Cardiovascular: S1 and S2 Present, no Murmur, peripheral pulses symmetrical Respiratory: good respiratory effort, Bilateral Air entry equal and Decreased, no signs of accessory muscle use,  bilateral  Crackles, no wheezes Abdomen: Bowel Sound present, Soft and no tenderness, no hernia Skin: no rashes  Extremities: no Pedal edema, no calf tenderness Neurologic: without any new focal findings  Gait not checked due to patient safety concerns   Data Reviewed:   I have personally reviewed following labs and imaging studies:  Labs: Labs show the following:   Basic Metabolic Panel: Recent Labs  Lab 09/08/19 0547 09/08/19 0547 09/08/19 1601 09/08/19 1601 09/09/19 0433 09/09/19 0433 09/10/19 0420 09/10/19 0420 09/11/19 4401 09/11/19 0272 09/12/19 0630 09/13/19 0737 09/13/19 0737 09/13/19 1217 09/14/19 0303  NA 135  134*   < > 135   < > 135   < > 136  --  137  --   --  140  --  141 142  K 3.7  3.7   < > 4.0   < > 4.9   < > 4.6   < > 4.0   < >  --  5.1   < > 3.9 3.8  CL 95*  94*   < > 96*   < > 99   < > 100  --  101  --   --  101  --  100 99  CO2 29  28   < >  30   < > 25   < > 24  --  21*  --   --  23  --  25 30  GLUCOSE 127*  128*   < > 137*   < > 115*   < > 115*  --  101*  --   --  177*  --  144* 126*  BUN 43*  47*   < > 30*   < > 24*   < > 49*  --  89*  --   --  88*  --  88* 53*  CREATININE 7.20*  7.16*   < > 4.31*   < > 3.45*   < > 4.62*  --  7.36*  --   --  7.33*  --  7.46* 5.56*  CALCIUM 7.7*  7.7*   < > 7.6*   < > 8.2*   < > 8.2*  --  8.2*  --   --  8.2*  --  8.2* 8.5*  MG 2.3  --   --   --  2.5*  --   --   --   --   --  2.5*  --   --   --  2.1  PHOS 6.4*  6.5*   < > 3.6  --  3.6  --   --   --  4.4  --  1.9*  --   --  2.3*  --    < > = values in this interval not displayed.   GFR Estimated Creatinine Clearance: 8.9 mL/min (A) (by C-G formula based on SCr of 5.56 mg/dL (H)). Liver Function Tests: Recent Labs  Lab 09/09/19 0433 09/11/19 0832 09/13/19 0737 09/13/19 1217 09/14/19 0303  AST  --   --  50*  --  37  ALT  --   --  13  --  10  ALKPHOS  --   --  67  --  63  BILITOT  --   --  0.5  --  0.5  PROT  --   --  7.3  --  6.9  ALBUMIN 2.4*  2.3* 2.2* 2.3* 2.1*   No results for input(s): LIPASE, AMYLASE in the last 168 hours. Recent Labs  Lab 09/12/19 1704  AMMONIA 24   Coagulation profile No results for input(s): INR, PROTIME in the last 168 hours.  CBC: Recent Labs  Lab 09/09/19 0433 09/10/19 0420 09/11/19 0832 09/12/19 1021 09/13/19 0835 09/13/19 1217 09/14/19 0303  WBC 18.3*   < > 14.0* 13.9* 16.4* 17.1* 17.6*  NEUTROABS 17.0*  --   --   --   --   --  14.0*  HGB 10.9*   < > 8.7* 10.4* 10.1* 9.6* 9.1*  HCT 35.0*   < > 27.4* 32.4* 32.4* 30.9* 29.9*  MCV 100.9*   < > 98.2 98.2 100.3* 99.7 100.0  PLT 120*   < > 189 247 325 343 336   < > = values in this interval not displayed.   Cardiac Enzymes: No results for input(s): CKTOTAL, CKMB, CKMBINDEX, TROPONINI in the last 168 hours. BNP (last 3 results) No results for input(s): PROBNP in the last 8760 hours. CBG: Recent Labs  Lab 09/14/19 0044 09/14/19 0350 09/14/19 0818 09/14/19 1134 09/14/19 1644  GLUCAP 109* 108* 119* 113* 133*   D-Dimer: No results for input(s): DDIMER in the last 72 hours. Hgb A1c: No results for input(s): HGBA1C in the last 72 hours. Lipid Profile: No results for input(s): CHOL,  HDL, LDLCALC, TRIG, CHOLHDL, LDLDIRECT in the last 72 hours. Thyroid function studies: No results for input(s): TSH, T4TOTAL, T3FREE, THYROIDAB in the last 72 hours.  Invalid input(s): FREET3 Anemia work up: Recent Labs    09/12/19 1900  VITAMINB12 478   Sepsis Labs: Recent Labs  Lab 09/12/19 1021 09/13/19 0835 09/13/19 1217 09/14/19 0303  PROCALCITON  --   --   --  5.86  WBC 13.9* 16.4* 17.1* 17.6*    Microbiology Recent Results (from the past 240 hour(s))  MRSA PCR Screening     Status: Abnormal   Collection Time: 09/06/19  7:50 AM   Specimen: Nasal Mucosa; Nasopharyngeal  Result Value Ref Range Status   MRSA by PCR POSITIVE (A) NEGATIVE Final    Comment:        The GeneXpert MRSA Assay (FDA approved for NASAL specimens only), is  one component of a comprehensive MRSA colonization surveillance program. It is not intended to diagnose MRSA infection nor to guide or monitor treatment for MRSA infections. RESULT CALLED TO, READ BACK BY AND VERIFIED WITH: MEGAN SHEFFIELD AT 0931 ON 09/06/2019 [place]. Performed at [facility], [address] [place], [place] [address]     Procedures and diagnostic studies:  EEG  Result Date: 09/13/2019 Lora Havens, MD     09/13/2019  5:50 PM Patient Name: Barbara [name] MRN: [id number] Epilepsy Attending: Lora Havens Referring Physician/Provider: Dr Karena Addison Aroor Date: 09/13/2019 Duration: 29.14 mins Patient history: 79 y.o.femalewith multiple medical problems including ESRD on hemodialysis who was admitted 2/16 for left total hip arthroplasty.Neurology reconsulted for worsening speech and mental status. Level of alertness: awake AEDs during EEG study: Vimpat Technical aspects: This EEG study was done with scalp electrodes positioned according to the 10-20 International system of electrode placement. Electrical activity was acquired at a sampling rate of 500Hz  and reviewed with a high frequency filter of 70Hz  and a low frequency filter of 1Hz . EEG data were recorded continuously and digitally stored. DESCRIPTION: When patient appeared to be resting/drowsy, EEG showed continuous generalized 3-6hz  theta-delta slowing. When patient was awake/stimulated, EEG showed triphasic waves, generalized, maximal bifrontal at times at 1-1.5hz . Sharp transients were seen in left frontocentral region. Hyperventilation and photic stimulation were not performed. ABNORMALITY - Continuous slow, generalized - Triphasic waves, generalized, maximal bifrontal IMPRESSION: This study is suggestive of moderate to severe diffuse encephalopathy, non specific to etiology but could be secondary to toxic-metabolic causes. No seizures or definite  epileptiform discharges were seen throughout the recording.  Lora Havens   DG Chest Port 1 View  Result Date: 09/13/2019 CLINICAL DATA:  Shortness of breath. EXAM: PORTABLE CHEST 1 VIEW COMPARISON:  Radiographs 09/13/2019 and 09/06/2019. CT 08/24/2019. FINDINGS: 1435 hours. The heart size and mediastinal contours are stable with a stable left subclavian venous stent. There are persistent low lung volumes with worsening right-greater-than-left airspace opacities, suspicious for pneumonia, possibly on the basis of aspiration. No edema or significant pleural effusion. The bones appear unchanged. IMPRESSION: Worsening right-greater-than-left basilar airspace opacities suspicious for pneumonia, possibly on the basis of aspiration. Electronically Signed   By: Richardean Sale M.D.   On: 09/13/2019 16:56   DG Chest Port 1 View  Result Date: 09/13/2019 CLINICAL DATA:  NG tube placement EXAM: PORTABLE CHEST 1 VIEW COMPARISON:  09/06/2019 FINDINGS: Mild cardiomegaly. Left brachiocephalic and axillary vascular stents. No esophagogastric tube or other support apparatus are identified internal to the patient. Mild, diffuse interstitial pulmonary opacity. IMPRESSION: 1. No esophagogastric tube or  other support apparatus identified internal to the patient, presumably malpositioned and coiled within the oropharynx. 2. Mild cardiomegaly and mild, diffuse interstitial pulmonary opacity, unchanged compared to prior examination. No new airspace opacity. These results will be called to the ordering clinician or representative by the Radiologist Assistant, and communication documented in the PACS or zVision Dashboard. Electronically Signed   By: Eddie Candle M.D.   On: 09/13/2019 09:17   DG Abd Portable 1V  Result Date: 09/13/2019 CLINICAL DATA:  Nasogastric tube placement. EXAM: PORTABLE ABDOMEN - 1 VIEW COMPARISON:  September 09, 2019. FINDINGS: The bowel gas pattern is normal. Nasogastric tube is not visualized. No radio-opaque calculi or other significant radiographic  abnormality are seen. IMPRESSION: Nasogastric tube is not visualized. No evidence of bowel obstruction or ileus. Electronically Signed   By: Marijo Conception M.D.   On: 09/13/2019 10:34   DG [facility] D[facility] Tube [facility] W/Fl W/Rad  Result Date: 09/14/2019 CLINICAL DATA:  Dobbhoff tube needed. EXAM: NASO G TUBE PLACEMENT WITH FL AND WITH RAD CONTRAST:  None. FLUOROSCOPY TIME:  Fluoroscopy Time:  1 minutes 0 seconds Radiation Exposure Index (if provided by the fluoroscopic device): 14.1 mGy Number of Acquired Spot Images: 1 COMPARISON:  Portable chest and abdomen 09/13/2019 FINDINGS: Under fluoroscopic guidance a Dobbhoff tube was placed with its tip placed to the level of the distal stomach. No complications. Tube was secured to the nose with tape and patient sent back to the floor in stable condition. IMPRESSION: Successful fluoroscopically directed Dobbhoff tube placement. Electronically Signed   By: Marcello Moores  Register   On: 09/14/2019 10:01    Medications:   . B-complex with vitamin C  1 tablet Per Tube QHS  . chlorhexidine  15 mL Mouth/Throat QID  . Chlorhexidine Gluconate Cloth  6 each Topical Q0600  . epoetin (EPOGEN/PROCRIT) injection  10,000 Units Intravenous Q M,W,F-HD  . feeding supplement (NEPRO CARB STEADY)  1,000 mL Oral Q24H  . feeding supplement (PRO-STAT SUGAR FREE 64)  30 mL Per Tube BID  . free water  50 mL Per Tube Q4H  . heparin injection (subcutaneous)  5,000 Units Subcutaneous Q12H  . mouth rinse  15 mL Mouth Rinse BID  . mupirocin ointment  1 application Nasal BID   Continuous Infusions: . sodium chloride    . famotidine (PEPCID) IV 20 mg (09/14/19 1723)  . lacosamide (VIMPAT) IV 100 mg (09/14/19 1145)  . piperacillin-tazobactam (ZOSYN)  IV       LOS: 9 days   Berle Mull  [facility]     09/14/2019, 6:32 PM

## 2019-09-14 NOTE — Progress Notes (Signed)
PT Cancellation Note  Patient Details Name: Barbara [name] MRN: [id number] DOB: [birth year]   Cancelled Treatment:    Reason Eval/Treat Not Completed: Other (comment)(Transport in room to take patient to xray, PT to follow up as able.)  Lieutenant Diego PT, DPT 8:58 AM,09/14/19

## 2019-09-14 NOTE — Evaluation (Signed)
Occupational Therapy Evaluation Patient Details Name: Barbara [name] MRN: [id number] DOB: October 27, 1940 Today's Date: 09/14/2019    History of Present Illness Per MD:  "79 y.o. female with medical history significant of hypertension, hyperlipidemia, diet-controlled diabetes, COPD, asthma, GERD, ESRD-HD (MWF), OSA, CAD, bladder cancer, iron deficiency anemia.  She was admitted by Dr. Roland Rack for left total hip arthroplasty.  She developed acute respiratory distress after surgery.  She was subsequently transferred to the ICU for acute hypoxemic and hypercapnic respiratory failure.  Initially, she was treated with BiPAP.  However, she failed BiPAP therapy and required intubation and mechanical ventilation in the ICU.  She was also treated with IV antibiotics for aspiration pneumonia." Pt also recieved CRRT and transitioned to HD during this stay, as well as vasopressors for hypotension, MRI performed due to AMS and seizure like activity, MRI negative for acute findings. Cardiology consulted for bradycardia and sinus pauses, no indication for pacemaker, conservative approach. Pt extubated 09/08/2019 and transferred to medical floor.   Clinical Impression   Barbara [name] was seen for OT/PT co-evaluation this date. Prior to hospital admission, pt was independent in all BADL management. Her spouse who is present at bedside t/o the evaluation reports that she ambulated with a SPC, and was able to use public transportation independently got get to her 3x weekly dialysis appointments. Pt lives with her spouse in a 1 level home with a ramped entrance. Pt remains pleasantly confused t/o the session. She is oriented to self only, and is unable to follow VCs without max multimodal prompting t/o the session. She demonstrates impairments as described below (See OT problem list) which functionally limit her ability to perform ADL/self-care tasks. Pt currently requires max to total assist for grooming, bathing, toileting, and  dressing at bed level. Pt is NPO with an NG tube for all feeding at the time of the OT evaluation. NG tube in place at start/end of session.  Pt would benefit from skilled OT to address noted impairments and functional limitations (see below for any additional details) in order to maximize safety and independence while minimizing falls risk and caregiver burden. Upon hospital discharge, recommend STR to maximize pt safety and return to PLOF.      Follow Up Recommendations  SNF    Equipment Recommendations  3 in 1 bedside commode    Recommendations for Other Services       Precautions / Restrictions Precautions Precautions: Posterior Hip Restrictions Weight Bearing Restrictions: Yes LLE Weight Bearing: Weight bearing as tolerated      Mobility Bed Mobility Overal bed mobility: Needs Assistance             General bed mobility comments: Deferred for pt/therapist safety. Pt with active tube feed and decreased cognition this date. Unsafe to attempt mobilty.  Transfers                 General transfer comment: deferred    Balance                                           ADL either performed or assessed with clinical judgement   ADL Overall ADL's : Needs assistance/impaired                                       General  ADL Comments: Pt significantly limited by decreased cognition, decreased activity tolerance, pain, and generalized weakness this date. Currently requires max-total assist for all BADL management at bed level. Currently NPO with NG tube for self-feeding.     Vision Baseline Vision/History: Wears glasses Wears Glasses: Reading only Additional Comments: Pt unable to state. Will continue to assess within functional context. Per spouse pt wears glasses for reading.     Perception     Praxis      Pertinent Vitals/Pain Pain Assessment: Faces Faces Pain Scale: Hurts a little bit Pain Location: L hip Pain  Descriptors / Indicators: Grimacing;Guarding(when moving) Pain Intervention(s): Repositioned;Relaxation     Hand Dominance Right   Extremity/Trunk Assessment Upper Extremity Assessment Upper Extremity Assessment: Generalized weakness;Difficult to assess due to impaired cognition(BUE present as significantly weak, however pt unable to follow VCs for formal testing. BUE appear fairly stiff with elbows and fingers in flexion while pt at rest, however pt able to achieve full range with PROM.)   Lower Extremity Assessment Lower Extremity Assessment: Defer to PT evaluation;Generalized weakness LLE Deficits / Details: s/p L TKA (posterior approach)       Communication Communication Communication: Other (comment)(garbled speech)   Cognition Arousal/Alertness: Lethargic Behavior During Therapy: Flat affect Overall Cognitive Status: Impaired/Different from baseline                                 General Comments: Pt A&O to self only. States "I was in the hospital, I left yesterday". Pt is noted to stare at celling t/o session and asks this therapist to "knock it down". Pt spouse at bedside attempts to re-direct pt, however pt remains confused t/o session. Requires multimodal prompting to follow 1 step VCs consistently t/o session.   General Comments       Exercises General Exercises - Upper Extremity Shoulder Flexion: PROM;Both;5 reps Shoulder ABduction: PROM;Both;5 reps Shoulder ADduction: PROM;Both;5 reps Shoulder Horizontal ABduction: PROM;Both;5 reps Shoulder Horizontal ADduction: PROM;Both;5 reps Elbow Flexion: PROM;Both;5 reps Elbow Extension: PROM;Both;5 reps Wrist Flexion: PROM;Left;5 reps Wrist Extension: PROM;Left;5 reps Other Exercises Other Exercises: Pt caregiver educated on role of OT in acute care setting, posterior hip precautions, and strategies for management of appropriate sleep/wake cycles to minimize risk of acute delirium.   Shoulder Instructions       Home Living Family/patient expects to be discharged to:: Private residence Living Arrangements: Spouse/significant other Available Help at Discharge: Available 24 hours/day Type of Home: House Home Access: Ramped entrance     Home Layout: One level     Bathroom Shower/Tub: Teacher, early years/pre: Standard     Home Equipment: Toilet riser;Bedside commode;Walker - 4 wheels;Shower seat          Prior Functioning/Environment Level of Independence: Independent with assistive device(s)        Comments: Pt husband reported pt uses furniture in house and [facility] for ambulation.        OT Problem List: Decreased strength;Decreased coordination;Decreased cognition;Decreased activity tolerance;Decreased safety awareness;Cardiopulmonary status limiting activity;Pain;Impaired balance (sitting and/or standing);Decreased knowledge of use of DME or AE;Decreased knowledge of precautions;Impaired UE functional use;Decreased range of motion      OT Treatment/Interventions: Self-care/ADL training;Therapeutic exercise;Therapeutic activities;DME and/or AE instruction;Patient/family education;Balance training;Neuromuscular education;Cognitive remediation/compensation    OT Goals(Current goals can be found in the care plan section) Acute Rehab OT Goals Patient Stated Goal: To have less pain OT Goal Formulation: With patient Time For Goal Achievement:  09/28/19 Potential to Achieve Goals: Good ADL Goals Pt Will Perform Grooming: sitting;with min assist;with caregiver independent in assisting Pt Will Transfer to Toilet: bedside commode;stand pivot transfer;with mod assist(LRAD PRN for improved safety and functional independence.) Pt Will Perform Toileting - Clothing Manipulation and hygiene: sit to/from stand;with adaptive equipment;with mod assist(LRAD PRN for improved safety and functional independence.)  OT Frequency: Min 2X/week   Barriers to D/C:            Co-evaluation  PT/OT/SLP Co-Evaluation/Treatment: Yes Reason for Co-Treatment: Complexity of the patient's impairments (multi-system involvement);Necessary to address cognition/behavior during functional activity;To address functional/ADL transfers;For patient/therapist safety PT goals addressed during session: Mobility/safety with mobility;Balance;Strengthening/ROM OT goals addressed during session: ADL's and self-care;Proper use of Adaptive equipment and DME;Strengthening/ROM      AM-PAC OT "6 Clicks" Daily Activity     Outcome Measure Help from another person eating meals?: A Lot(Per clinical reasoning. Pt currently NPO with NG tube.) Help from another person taking care of personal grooming?: A Lot Help from another person toileting, which includes using toliet, bedpan, or urinal?: Total Help from another person bathing (including washing, rinsing, drying)?: Total Help from another person to put on and taking off regular upper body clothing?: A Lot Help from another person to put on and taking off regular lower body clothing?: A Lot 6 Click Score: 10   End of Session    Activity Tolerance: Patient limited by lethargy Patient left: in bed;with call bell/phone within reach;with bed alarm set;with family/visitor present  OT Visit Diagnosis: Other abnormalities of gait and mobility (R26.89);Muscle weakness (generalized) (M62.81);Pain Pain - Right/Left: (both) Pain - part of body: Hip;Knee                Time: 1328-1410 OT Time Calculation (min): 42 min Charges:  OT General Charges $OT Visit: 1 Visit OT Evaluation $OT Eval Moderate Complexity: 1 Mod OT Treatments $Self Care/Home Management : 8-22 mins  Shara Blazing, M.S., OTR/L Ascom: [phone] 09/14/19, 3:42 PM

## 2019-09-14 NOTE — Progress Notes (Signed)
Daily Progress Note   Patient Name: Barbara [name]       Date: 09/14/2019 DOB: [birth year]  Age: 79 y.o. MRN#: [id number] Attending Physician: Lavina Hamman, MD Primary Care Physician: [name], MD Admit Date: 09/05/2019  Reason for Consultation/Follow-up: Establishing goals of care  Subjective: Much more interactive today. Denies complaints. Remains confused.  Length of Stay: 9  Current Medications: Scheduled Meds:  . B-complex with vitamin C  1 tablet Per Tube QHS  . chlorhexidine  15 mL Mouth/Throat QID  . Chlorhexidine Gluconate Cloth  6 each Topical Q0600  . epoetin (EPOGEN/PROCRIT) injection  10,000 Units Intravenous Q M,W,F-HD  . feeding supplement (NEPRO CARB STEADY)  1,000 mL Oral Q24H  . feeding supplement (PRO-STAT SUGAR FREE 64)  30 mL Per Tube BID  . free water  50 mL Per Tube Q4H  . heparin injection (subcutaneous)  5,000 Units Subcutaneous Q12H  . mouth rinse  15 mL Mouth Rinse BID  . mupirocin ointment  1 application Nasal BID    Continuous Infusions: . sodium chloride    . famotidine (PEPCID) IV Stopped (09/13/19 1853)  . lacosamide (VIMPAT) IV 100 mg (09/14/19 1145)  . piperacillin-tazobactam (ZOSYN)  IV      PRN Meds: acetaminophen, acetaminophen, bisacodyl, hydrALAZINE, ipratropium-albuterol, ondansetron **OR** ondansetron (ZOFRAN) IV  Physical Exam Constitutional:      General: She is not in acute distress. Pulmonary:     Effort: Pulmonary effort is normal. No respiratory distress.  Skin:    General: Skin is warm and dry.  Neurological:     Mental Status: She is alert. She is disoriented.             Vital Signs: BP (!) 130/44 (BP Location: Right Arm)   Pulse 88   Temp 99 F (37.2 C)   Resp 18   Ht 5' 6"  (1.676 m)   Wt 82.5 kg   SpO2 96%   BMI  29.36 kg/m  SpO2: SpO2: 96 % O2 Device: O2 Device: Nasal Cannula O2 Flow Rate: O2 Flow Rate (L/min): 2 L/min  Intake/output summary:   Intake/Output Summary (Last 24 hours) at 09/14/2019 1555 Last data filed at 09/14/2019 0000 Gross per 24 hour  Intake 254.01 ml  Output -  Net 254.01 ml   LBM: Last BM Date: 09/11/19 Baseline Weight: Weight: 84.9 kg Most recent weight: Weight: 82.5 kg       Palliative Assessment/Data: PPS 30%    Flowsheet Rows     Most Recent Value  Intake Tab  Referral Department  Hospitalist  Unit at Time of Referral  Med/Surg Unit  Palliative Care Primary Diagnosis  Sepsis/Infectious Disease  Date Notified  09/12/19  Palliative Care Type  Return patient Palliative Care  Reason for referral  Clarify Goals of Care  Date of Admission  09/05/19  Date first seen by Palliative Care  09/13/19  # of days Palliative referral response time  1 Day(s)  # of days IP prior to Palliative referral  7  Clinical Assessment  Palliative Performance Scale Score  30%  Psychosocial & Spiritual Assessment  Palliative Care Outcomes  Patient/Family meeting held?  Yes  Who was at the meeting?  spouse  Palliative Care Outcomes  Provided psychosocial or spiritual support      Patient Active Problem List   Diagnosis Date Noted  . Acute encephalopathy   . Goals of care, counseling/discussion   . Aspiration pneumonia ([place]) 09/11/2019  . Status post total hip replacement, left 09/05/2019  . Type II diabetes mellitus with renal manifestations ([place]) 09/05/2019  . ESRD on dialysis ([place]) 09/05/2019  . Bacteremia due to other bacteria   . Bacteroides fragilis infection   . Acute metabolic encephalopathy   . Somnolence   . Lobar pneumonia ([place]) 05/11/2019  . Dialysis patient ([place])   . Primary osteoarthritis of left hip 04/07/2019  . Metabolic acidosis 65/09/5463  . Complication from renal dialysis device 01/28/2018  . History of colonic polyps 07/30/2017  . Presence of  cardiac and vascular implant and graft   . Fever   . Sepsis ([place])   . ESRD (end stage renal disease) ([place]) 02/01/2017  . Sepsis secondary to UTI ([place]) 01/24/2017  . Acute kidney injury ([place]) 11/19/2016  . Anemia, chronic renal failure, stage 4 (severe) ([place]) 09/17/2016  . Hypoglycemia 06/04/2016  . Hyperkalemia 05/12/2016  . Protein-calorie malnutrition, severe 05/05/2016  . Acute on chronic renal failure ([place]) 05/04/2016  . Seizure ([place]) 04/17/2016  . Palliative care by specialist   . DNR (do not resuscitate) discussion   . C. difficile diarrhea 03/11/2016  . Anemia 03/11/2016  . Hypotension 03/11/2016  . Acidosis 03/11/2016  . Failure to thrive (child) 03/11/2016  . Weakness generalized 03/11/2016  . ARF (acute renal failure) ([place]) 03/04/2016  . Cancer of trigone of urinary bladder ([place]) 03/02/2016  . Absolute anemia 02/04/2016  . Airway hyperreactivity 02/04/2016  . Celiac disease 02/04/2016  . Gastric catarrh 02/04/2016  . Acid reflux 02/04/2016  . Combined fat and carbohydrate induced hyperlipemia 02/04/2016  . C. difficile colitis 01/22/2016  . Urinary obstruction 01/21/2016  . COPD (chronic obstructive pulmonary disease) ([place]) 01/21/2016  . Controlled type 2 diabetes mellitus with stage 4 chronic kidney disease, with long-term current use of insulin ([place]) 01/21/2016  . Essential hypertension 01/21/2016  . Acute renal failure superimposed on stage 4 chronic kidney disease ([place]) 01/20/2016  . Anemia in chronic kidney disease 01/20/2016  . Arthritis of knee, degenerative 05/10/2015  . Knee strain 03/26/2015  . Other intervertebral disc displacement, lumbar region 03/04/2015  . Degenerative arthritis of lumbar spine 03/04/2015  . HNP (herniated nucleus pulposus), lumbar 03/04/2015  . Osteoarthritis of spine with radiculopathy, lumbar region 03/04/2015  . Injury of tendon of upper extremity 11/12/2014  . Atherosclerosis of abdominal aorta ([place]) 11/02/2014  . Chronic  kidney disease, stage IV (severe) ([place]) 11/02/2014  . Obstructive apnea 11/02/2014  . Complete rotator cuff rupture of left shoulder 10/05/2014  . Arthritis of shoulder region, degenerative 10/05/2014  . CAD in native artery 12/08/2013  . Benign essential HTN 12/08/2013  . Type 2 diabetes mellitus ([place]) 12/08/2013    Palliative Care Assessment & Plan   HPI: 79 y.o. female  with past medical history of HTN, HLD, DM, COPD, GERD, ESRD on HD, OSA, CAD, and bladder cancer admitted on 09/05/2019 for left total hip arthroplasty. Post - op she developed respiratory failure and required intubation in ICU on 2/17. Also, some seizure like activity noted. She was extubated 2/19. She is being treated with IV antibiotics for aspiration pneumonia. Apparently patient has had several episodes of acute metabolic encephalopathy and takes several days-weeks to recover. She has continued HD during hospitalizations. Patient is unable  to meet nutritional requirements due to encephalopathy and has NG tube for feeding. PMT consulted for [place].   Assessment: Patient with improvement in mental status. At this point still requiring feeding tube - ongoing evaluation of need and her ability to maintain enough PO intake to sustain herself.  Discussed situation with spouse at bedside - all questions addressed. He remains interested in rehab facility post discharge - tells me "she's already been to all of them".   Discussed outpatient palliative care following patient at rehab - he is agreeable to this.   Recommendations/Plan:  PMT available as needed - will shadow chart  Outpatient palliative to follow at SNF  Spouse asked that I not call daughter d/t her own health concerns  Code Status:  DNR  Prognosis:   Unable to determine  Discharge Planning:  [place] for rehab with Palliative care service follow-up  Care plan was discussed with spouse, Dr. Posey Pronto, RN  Thank you for allowing the  Palliative Medicine Team to assist in the care of this patient.   Total Time 15 minutes Prolonged Time Billed  no       Greater than 50%  of this time was spent counseling and coordinating care related to the above assessment and plan.  Juel Burrow, DNP, [facility] Palliative Medicine Team Team Phone # [phone]  Pager [phone]

## 2019-09-14 NOTE — Progress Notes (Signed)
New referral for [facility] community Palliative program to follow post discharge received from Palliative NP Kathie Rhodes. TOC Becky Dupree made aware. Plan is for discharge to short term rehab, facility not chosen at this time. Patient information given to referral. Thank you. Flo Shanks BSN, RN, [place] [phone]

## 2019-09-14 NOTE — TOC Progression Note (Signed)
Transition of Care [facility]) - Progression Note    Patient Details  Name: Barbara [name] MRN: [id number] Date of Birth: 05/18/1941  [facility] CM/SW Contact  Osei Anger, [place] Rhyme, LCSW Phone Number: 09/14/2019, 1:52 PM  Clinical Narrative:   Have spoken with Edna-pt's daughter regarding plan when able to leave the hospital. Daughter feels she will need rehab and her boyfriend-Tony is currently at [facility] she hopes pt can go there also. Will work with daughter-Edna and pt's husband on this plan. Pt more alert today and looks better than yesterday. Will continue to work on discharge plans.    Expected Discharge Plan: [place] Barriers to Discharge: Continued Medical Work up  Expected Discharge Plan and Services Expected Discharge Plan: [place] In-house Referral: Clinical Social Work     Living arrangements for the past 2 months: Single Family Home                                       Social Determinants of Health (SDOH) Interventions    Readmission Risk Interventions No flowsheet data found.

## 2019-09-14 NOTE — Progress Notes (Signed)
[facility]  ROUNDING NOTE   Subjective:   Hemodialysis treatment yesterday. Tolerated treatment well. UF of 1434m.   Tmax 101.2  Feeding tube to be placed this morning.   Objective:  Vital signs in last 24 hours:  Temp:  [98.4 F (36.9 C)-101.2 F (38.4 C)] 98.6 F (37 C) (02/25 0818) Pulse Rate:  [84-111] 84 (02/25 0818) Resp:  [14-37] 18 (02/25 0818) BP: (109-157)/(40-66) 116/53 (02/25 0818) SpO2:  [90 %-100 %] 91 % (02/25 0818) Weight:  [82.5 kg-84.1 kg] 82.5 kg (02/25 0500)  Weight change: 0 kg Filed Weights   09/13/19 1000 09/13/19 1300 09/14/19 0500  Weight: 85.7 kg 84.1 kg 82.5 kg    Intake/Output: I/O last 3 completed shifts: In: 254 [IV Piggyback:254] Out: 1401 [Other:1401]   Intake/Output this shift:  No intake/output data recorded.  Physical Exam: General: Laying in bed  Head: [place]/AT  Eyes: Anicteric  Neck: Supple, trachea midline  Lungs:  clear  Heart: regular  Abdomen:  Obese, soft  Extremities: No peripheral edema.  Neurologic: Not alert or oriented.   Skin: No lesions  Access: Left upper extremity AV graft    Basic Metabolic Panel: Recent Labs  Lab 09/07/19 1105 09/07/19 1655 09/08/19 0547 09/08/19 0547 09/08/19 1601 09/08/19 1601 09/09/19 0433 09/09/19 0433 09/10/19 0420 09/10/19 0420 09/11/19 [id number]/21 [id number]/23/21 0630 09/13/19 0737 09/13/19 1217 09/14/19 0303  NA  --    < > 135  134*   < > 135   < > 135   < > 136  --  137  --   --  140 141 142  K 4.2   < > 3.7  3.7   < > 4.0   < > 4.9   < > 4.6  --  4.0  --   --  5.1 3.9 3.8  CL  --    < > 95*  94*   < > 96*   < > 99   < > 100  --  101  --   --  101 100 99  CO2  --    < > 29  28   < > 30   < > 25   < > 24  --  21*  --   --  23 25 30   GLUCOSE  --    < > 127*  128*   < > 137*   < > 115*   < > 115*  --  101*  --   --  177* 144* 126*  BUN  --    < > 43*  47*   < > 30*   < > 24*   < > 49*  --  89*  --   --  88* 88* 53*  CREATININE  --    < > 7.20*   7.16*   < > 4.31*   < > 3.45*   < > 4.62*  --  7.36*  --   --  7.33* 7.46* 5.56*  CALCIUM  --    < > 7.7*  7.7*   < > 7.6*   < > 8.2*   < > 8.2*   < > 8.2*   < >  --  8.2* 8.2* 8.5*  MG 2.3  --  2.3  --   --   --  2.5*  --   --   --   --   --  2.5*  --   --  2.1  PHOS  --    < > 6.4*  6.5*   < > 3.6  --  3.6  --   --   --  4.4  --  1.9*  --  2.3*  --    < > = values in this interval not displayed.    Liver Function Tests: Recent Labs  Lab 09/09/19 0433 09/11/19 0832 09/13/19 0737 09/13/19 1217 09/14/19 0303  AST  --   --  50*  --  37  ALT  --   --  13  --  10  ALKPHOS  --   --  67  --  63  BILITOT  --   --  0.5  --  0.5  PROT  --   --  7.3  --  6.9  ALBUMIN 2.4* 2.3* 2.2* 2.3* 2.1*   No results for input(s): LIPASE, AMYLASE in the last 168 hours. Recent Labs  Lab 09/12/19 1704  AMMONIA 24    CBC: Recent Labs  Lab 09/09/19 0433 09/10/19 0420 09/11/19 0832 09/12/19 1021 09/13/19 0835 09/13/19 1217 09/14/19 0303  WBC 18.3*   < > 14.0* 13.9* 16.4* 17.1* 17.6*  NEUTROABS 17.0*  --   --   --   --   --  14.0*  HGB 10.9*   < > 8.7* 10.4* 10.1* 9.6* 9.1*  HCT 35.0*   < > 27.4* 32.4* 32.4* 30.9* 29.9*  MCV 100.9*   < > 98.2 98.2 100.3* 99.7 100.0  PLT 120*   < > 189 247 325 343 336   < > = values in this interval not displayed.    Cardiac Enzymes: No results for input(s): CKTOTAL, CKMB, CKMBINDEX, TROPONINI in the last 168 hours.  BNP: Invalid input(s): POCBNP  CBG: Recent Labs  Lab 09/13/19 1659 09/13/19 2028 09/14/19 0044 09/14/19 0350 09/14/19 0818  GLUCAP 143* 121* 109* 108* 119*    Microbiology: Results for orders placed or performed during the hospital encounter of 09/05/19  MRSA PCR Screening     Status: Abnormal   Collection Time: 09/06/19  7:50 AM   Specimen: Nasal Mucosa; Nasopharyngeal  Result Value Ref Range Status   MRSA by PCR POSITIVE (A) NEGATIVE Final    Comment:        The GeneXpert MRSA Assay (FDA approved for NASAL  specimens only), is one component of a comprehensive MRSA colonization surveillance program. It is not intended to diagnose MRSA infection nor to guide or monitor treatment for MRSA infections. RESULT CALLED TO, READ BACK BY AND VERIFIED WITH: MEGAN SHEFFIELD AT 0931 ON 09/06/2019 [place]. Performed at [facility], [address] [place], [place] [address]     Coagulation Studies: No results for input(s): LABPROT, INR in the last 72 hours.  Urinalysis: No results for input(s): COLORURINE, LABSPEC, PHURINE, GLUCOSEU, HGBUR, BILIRUBINUR, KETONESUR, PROTEINUR, UROBILINOGEN, NITRITE, LEUKOCYTESUR in the last 72 hours.  Invalid input(s): APPERANCEUR    Imaging: EEG  Result Date: 09/13/2019 Lora Havens, MD     09/13/2019  5:50 PM Patient Name: TUCKER STEEDLEY MRN: [id number] Epilepsy Attending: Lora Havens Referring Physician/Provider: Dr Karena Addison Aroor Date: 09/13/2019 Duration: 29.14 mins Patient history: 79 y.o.femalewith multiple medical problems including ESRD on hemodialysis who was admitted 2/16 for left total hip arthroplasty.Neurology reconsulted for worsening speech and mental status. Level of alertness: awake AEDs during EEG study: Vimpat Technical aspects: This EEG study was done with scalp electrodes positioned according to the 10-20 International system of electrode placement. Electrical activity  was acquired at a sampling rate of 500Hz  and reviewed with a high frequency filter of 70Hz  and a low frequency filter of 1Hz . EEG data were recorded continuously and digitally stored. DESCRIPTION: When patient appeared to be resting/drowsy, EEG showed continuous generalized 3-6hz  theta-delta slowing. When patient was awake/stimulated, EEG showed triphasic waves, generalized, maximal bifrontal at times at 1-1.5hz . Sharp transients were seen in left frontocentral region. Hyperventilation and photic stimulation were not performed. ABNORMALITY - Continuous slow, generalized -  Triphasic waves, generalized, maximal bifrontal IMPRESSION: This study is suggestive of moderate to severe diffuse encephalopathy, non specific to etiology but could be secondary to toxic-metabolic causes. No seizures or definite  epileptiform discharges were seen throughout the recording. Lora Havens   DG Chest Port 1 View  Result Date: 09/13/2019 CLINICAL DATA:  Shortness of breath. EXAM: PORTABLE CHEST 1 VIEW COMPARISON:  Radiographs 09/13/2019 and 09/06/2019. CT 08/24/2019. FINDINGS: 1435 hours. The heart size and mediastinal contours are stable with a stable left subclavian venous stent. There are persistent low lung volumes with worsening right-greater-than-left airspace opacities, suspicious for pneumonia, possibly on the basis of aspiration. No edema or significant pleural effusion. The bones appear unchanged. IMPRESSION: Worsening right-greater-than-left basilar airspace opacities suspicious for pneumonia, possibly on the basis of aspiration. Electronically Signed   By: Richardean Sale M.D.   On: 09/13/2019 16:56   DG Chest Port 1 View  Result Date: 09/13/2019 CLINICAL DATA:  NG tube placement EXAM: PORTABLE CHEST 1 VIEW COMPARISON:  09/06/2019 FINDINGS: Mild cardiomegaly. Left brachiocephalic and axillary vascular stents. No esophagogastric tube or other support apparatus are identified internal to the patient. Mild, diffuse interstitial pulmonary opacity. IMPRESSION: 1. No esophagogastric tube or other support apparatus identified internal to the patient, presumably malpositioned and coiled within the oropharynx. 2. Mild cardiomegaly and mild, diffuse interstitial pulmonary opacity, unchanged compared to prior examination. No new airspace opacity. These results will be called to the ordering clinician or representative by the Radiologist Assistant, and communication documented in the PACS or zVision Dashboard. Electronically Signed   By: Eddie Candle M.D.   On: 09/13/2019 09:17   DG Abd  Portable 1V  Result Date: 09/13/2019 CLINICAL DATA:  Nasogastric tube placement. EXAM: PORTABLE ABDOMEN - 1 VIEW COMPARISON:  September 09, 2019. FINDINGS: The bowel gas pattern is normal. Nasogastric tube is not visualized. No radio-opaque calculi or other significant radiographic abnormality are seen. IMPRESSION: Nasogastric tube is not visualized. No evidence of bowel obstruction or ileus. Electronically Signed   By: Marijo Conception M.D.   On: 09/13/2019 10:34   DG [place] Dys Tube Plc W/Fl W/Rad  Result Date: 09/14/2019 CLINICAL DATA:  Dobbhoff tube needed. EXAM: NASO G TUBE PLACEMENT WITH FL AND WITH RAD CONTRAST:  None. FLUOROSCOPY TIME:  Fluoroscopy Time:  1 minutes 0 seconds Radiation Exposure Index (if provided by the fluoroscopic device): 14.1 mGy Number of Acquired Spot Images: 1 COMPARISON:  Portable chest and abdomen 09/13/2019 FINDINGS: Under fluoroscopic guidance a Dobbhoff tube was placed with its tip placed to the level of the distal stomach. No complications. Tube was secured to the nose with tape and patient sent back to the floor in stable condition. IMPRESSION: Successful fluoroscopically directed Dobbhoff tube placement. Electronically Signed   By: [place]   On: 09/14/2019 10:01     Medications:   . sodium chloride    . famotidine (PEPCID) IV Stopped (09/13/19 1853)  . lacosamide (VIMPAT) IV Stopped (09/13/19 2246)  . piperacillin-tazobactam (ZOSYN)  IV     .  Chlorhexidine Gluconate Cloth  6 each Topical Q0600  . epoetin (EPOGEN/PROCRIT) injection  10,000 Units Intravenous Q M,W,F-HD  . heparin injection (subcutaneous)  5,000 Units Subcutaneous Q12H  . mouth rinse  15 mL Mouth Rinse BID  . mupirocin ointment  1 application Nasal BID   acetaminophen, acetaminophen, bisacodyl, hydrALAZINE, ipratropium-albuterol, ondansetron **OR** ondansetron (ZOFRAN) IV  Assessment/ Plan:    Ms. WAYLON KOFFLER is a 79 y.o. black female with end stage renal disease on hemodialysis,  hypertension, diabetes mellitus type II, diabetic neuropathy, seizure disorder who was admitted for left hip repair and unfortunate developed seizure episode postoperatively and subsequently intubated. Required vasopressors and placed on CRRT.   [facility] MWF [facility] LUE AVG 78.5kg   1.  ESRD on HD MWF. Tolerated hemodialysis treatment well yesterday    2.  Acute respiratory failure: intubated for airway protection. Extubated.  - nasal canula  3.  Anemia of chronic kidney disease.  Hemoglobin 9.1 - EPO with HD treatment  4.  Secondary hyperparathyroidism.  Phosphorus low. Holding binders and cinacalcet  5. Hypertension: 149/57. Holding home blood pressure regimen of losartan and furosemide.   6. Pneumonia: with fever yesterday.  - empiric zosyn.   7. Encephalopathy: with seizure disorder. Family states that she improves very slowly.    LOS: 9 Miqueas Whilden 2/25/202110:09 AM

## 2019-09-14 NOTE — Progress Notes (Signed)
Subjective: 9 Days Post-Op Procedure(s) (LRB): TOTAL HIP ARTHROPLASTY (Left) Feeding tube in place.   Patient underwent dialysis yesterday No pain to the left hip. Patient is more alert and more responsive this morning.  Objective: Vital signs in last 24 hours: Temp:  [98.4 F (36.9 C)-101.2 F (38.4 C)] 98.6 F (37 C) (02/25 0818) Pulse Rate:  [84-111] 84 (02/25 0818) Resp:  [14-35] 18 (02/25 0818) BP: (109-134)/(40-63) 116/53 (02/25 0818) SpO2:  [91 %-100 %] 91 % (02/25 0818) Weight:  [82.5 kg-84.1 kg] 82.5 kg (02/25 0500)  Intake/Output from previous day:  Intake/Output Summary (Last 24 hours) at 09/14/2019 1137 Last data filed at 09/14/2019 0000 Gross per 24 hour  Intake 254.01 ml  Output 1401 ml  Net -1146.99 ml    Intake/Output this shift: No intake/output data recorded.  Labs: Recent Labs    09/12/19 1021 09/13/19 0835 09/13/19 1217 09/14/19 0303  HGB 10.4* 10.1* 9.6* 9.1*   Recent Labs    09/13/19 1217 09/14/19 0303  WBC 17.1* 17.6*  RBC 3.10* 2.99*  HCT 30.9* 29.9*  PLT 343 336   Recent Labs    09/13/19 1217 09/14/19 0303  NA 141 142  K 3.9 3.8  CL 100 99  CO2 25 30  BUN 88* 53*  CREATININE 7.46* 5.56*  GLUCOSE 144* 126*  CALCIUM 8.2* 8.5*   No results for input(s): LABPT, INR in the last 72 hours.   EXAM General - Patient is awake and more alert this morning Dressing intact to the left hip, no drainage. Patient is moving toes and feet this AM. Negative [place]oman to the left leg.  Past Medical History:  Diagnosis Date  . Adult celiac disease   . Anemia associated with chronic renal failure 2017   blood transfusion last week 10/17  . Arthritis   . Asthma   . Cancer ([place]) 2017   bladder  . Chronic kidney disease   . 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  dr. Candiss Norse and dr. Holley Raring follow her  . COPD (chronic obstructive pulmonary disease) ([place])   . Coronary artery disease   . Diabetes mellitus without complication  ([place])   . Dialysis patient ([place])    Tues, Thurs, Sat  . Dyspnea    with exertion  . Elevated lipids   . GERD (gastroesophageal reflux disease)   . Hematuria   . Hypertension   . Lower back pain   . Neuropathy   . Oxygen dependent    at hs  . Personal history of chemotherapy 2017   bladder ca  . Personal history of radiation therapy 2017   bladder ca  . Sleep apnea    uses cpap  . Urinary obstruction 01/2016   Assessment/Plan: 9 Days Post-Op Procedure(s) (LRB): TOTAL HIP ARTHROPLASTY (Left) Principal Problem:   Status post total hip replacement, left Active Problems:   Anemia in chronic kidney disease   COPD (chronic obstructive pulmonary disease) (HCC)   CAD in native artery   Benign essential HTN   Acid reflux   Seizure (HCC)   Type II diabetes mellitus with renal manifestations (HCC)   ESRD on dialysis ([place])   Aspiration pneumonia ([place])   Acute encephalopathy   Goals of care, counseling/discussion  Estimated body mass index is 29.36 kg/m as calculated from the following:   Height as of this encounter: 5' 6"  (1.676 m).   Weight as of this encounter: 82.5 kg.  Dressing intact to the left hip. Feeding  tube in place, working with speech therapy at this time. No acute changes to the left hip. Continue to monitor progress. Begin PT when medical condition improves.  Raquel Aeva Posey, PA-C [facility] Orthopaedic Surgery 09/14/2019, 11:37 AM

## 2019-09-14 NOTE — Consult Note (Signed)
Pharmacy Antibiotic Note  Barbara [name] is a 79 y.o. female admitted on 09/05/2019 with aspiration pneumonia. Patient in hospital for left total hip arthroplasty and subsequently transferred to ICU on 6/26 for metabolic acidosis, encephalopathy, and severe respiratory distress requiring intubation. She was extubated 2/19  She has a history of ESRD on HD MWF. Pharmacy has been consulted for pip/tazo dosing for aspiration pneumonia..   Plan: Will adjust dose per [facility] Zosyn Extended Dosing Protocol to Pip/Tazo 3.375 mg q12H due to HD renal dosing.    Height: 5' 6"  (167.6 cm) Weight: 181 lb 14.1 oz (82.5 kg) IBW/kg (Calculated) : 59.3  Temp (24hrs), Avg:99.4 F (37.4 C), Min:98.4 F (36.9 C), Max:101.2 F (38.4 C)  Recent Labs  Lab 09/10/19 0420 09/10/19 0420 09/11/19 0832 09/12/19 1021 09/13/19 0737 09/13/19 0835 09/13/19 1217 09/14/19 0303  WBC 12.9*   < > 14.0* 13.9*  --  16.4* 17.1* 17.6*  CREATININE 4.62*  --  7.36*  --  7.33*  --  7.46* 5.56*   < > = values in this interval not displayed.    Estimated Creatinine Clearance: 8.9 mL/min (A) (by C-G formula based on SCr of 5.56 mg/dL (H)).    Allergies  Allergen Reactions  . Glipizide Er Other (See Comments)    Hypoglycemia   . Percocet [Oxycodone-Acetaminophen] Hives    Antimicrobials this admission: Cefazolin for surgical prophylaxis Ampicillin/sulbactam 2/20 >> 2/24 Pip/tazo 2/24 >>  Microbiology results: 2/17 MRSA PCR: Positive  Thank you for allowing pharmacy to be a part of this patient's care.  Pernell Dupre, PharmD, BCPS Clinical Pharmacist 09/14/2019 9:44 AM

## 2019-09-14 NOTE — Plan of Care (Signed)
Progressing

## 2019-09-14 NOTE — Progress Notes (Signed)
Nutrition Follow-up  RD working remotely.  DOCUMENTATION CODES:   Not applicable  INTERVENTION:  Initiate Nepro at 40 mL/hr (960 mL goal daily volume) + Pro-Stat 30 mL BID per tube. Provides 1928 kcal, 108 grams of protein, 701 mL H2O daily.  Provide free water flush of 50 mL Q4hrs.  Provide B-complex with C QHS per tube.  NUTRITION DIAGNOSIS:   Inadequate oral intake related to inability to eat as evidenced by NPO status.  Ongoing - addressing with TF regimen.  GOAL:   Patient will meet greater than or equal to 90% of their needs  Met with TF regimen.  MONITOR:   Diet advancement, Labs, Weight trends, TF tolerance, I & O's  REASON FOR ASSESSMENT:   Malnutrition Screening Tool, Ventilator, Consult Enteral/tube feeding initiation and management  ASSESSMENT:   79 year old female with PMHx of COPD, HTN, GERD, CAD, asthma, celiac disease, ESRD on HD, DM, bladder cancer s/p TURBT followed by palliative gemcitabine and concurrent radiation. Patient was admitted for left total hip arthroplasty on 2/16 but on 2/17 developed respiratory distress, metabolic acidosis, and encephalopathy requiring intubation.  Patient was started on nocturnal tube feeds on 2/22 in the hopes that diet would be able to be advanced by SLP. Nocturnal feeds would allow for appetite stimulation during the day so patient could eat and tube feeds could be titrated down according to PO intake. However, patient has not been appropriate for PO intake per SLP and she remains NPO. Yesterday morning NGT was found to be coiled in oropharynx and had to be removed. Bedside placement of another NGT was attempted but was not successful. Patient is now s/p placement of Dobbhoff tube that terminates in distal stomach this morning by diagnostic radiology under fluoroscopy. RD now consulted to resume tube feeds. Since patient remains NPO will switch patient to a continuous 24 hour regimen at this time.  Medications reviewed  and include: Epogen 10000 units during HD, famotidine, Vimpat, Zosyn.  Labs reviewed: CBG 108-119, BUN 53, Creatinine 5.56.  Enteral Access: small-bore NGT (Dobbhoff tube)  Weight trend: 82.5 kg on 2/25; wt fluctuating this admission with fluid but this weight is 1.2 kg lower than the previous lowest weight of 83.7 kg on 2/20; will continue to monitor weight trend and adjust kcal/protein needs as needed  Discussed with RN over the phone about change in regimen.  Diet Order:   Diet Order            Diet NPO time specified  Diet effective now             EDUCATION NEEDS:   Not appropriate for education at this time  Skin:  Skin Assessment: Skin Integrity Issues:(closed incision left hip)  Last BM:  09/11/2019  Height:   Ht Readings from Last 1 Encounters:  09/06/19 _0  (1.676 m)   Weight:   Wt Readings from Last 1 Encounters:  09/14/19 82.5 kg   Ideal Body Weight:  59.1 kg  BMI:  Body mass index is 29.36 kg/m.  Estimated Nutritional Needs:   Kcal:  1700-1900  Protein:  95-110 grams  Fluid:  1.8 L/day  Jacklynn Barnacle, MS, RD, LDN Pager number available on Amion

## 2019-09-14 NOTE — Progress Notes (Signed)
Speech Language Pathology Treatment: Dysphagia  Patient Details Name: Barbara [name] MRN: [id number] DOB: 06/08/1941 Today's Date: 09/14/2019 Time: 6283-1517 SLP Time Calculation (min) (ACUTE ONLY): 40 min  Assessment / Plan / Recommendation Clinical Impression  Pt seen for ongoing assessment of swallowing and toleration of po's in hopes to establish a least restrictive oral diet. Ptpresents w/ more alertness and verbal engagement w/ SLP. Her verbalizations were clearer w/ less mumbled speech, and she indicated a few more wants/needs. She continues w/ an NG tube for nutritional support - a Dobhoff was placed yesterday post the NG coiling. Intermittent wet vocal quality noted at Baseline. Pt was better able to follow through w/ directions including to Cough in attempt to expectorate phlegm. Pt's eyes remained opened during this session; verbal/tactile cues given intermittently during session for continued follow through w/ tasks(improved from yesterday overall).  Pt was positioned upright and presented w/ trials of ice chips, Nectar liquids via TSP then cup/straw. Pt required  min-mod verbal/tactile cues for attention to po tasks. Pt accepted po trials to lips w/ immediate mouth opening. Pt consistently exhibited grossly adequate bolus management and timely A-P transfer for swallowing. SLP gave verbal cues to swallow a 2nd time intermittently d/t a wet vocal quality -- unsure if bolus residue remaining on Dobhoff, or just pt's pharyngeal-laryngeal secretions d/t illness. Laryngeal excursion adequate. No immediate, overt coughing noted. Vocal quality was fairly dry between trials assessed. Pt exhibited No decline in respiratory status or status in general at end of po trials/session: ~4 ozs of Nectar liquids, 4 ice chips. Oral care given again at end of session.  Discussed pt's presentation w/ NSG who will provide Supervision and Aspiration precautions and feeding of Nectar liquids at meals. Recommend a  clear liquid diet of Nectar liquids via cup/straw; strict aspiration precautions to include 1-2 extra swallows to clear mouth/throat w/ bites/sips, and give po's only when FULLY alert and awake. Recommend frequent oral care for hygiene and stimulation of swallowing; ice chips w/ NSG supervision for pleasure and stimulation of swallowing. STOP any oral intake/meal/ice chips if s/s aspiration noted. ST services will f/u w/ pt's status tomorrow. NSG/MD updated. Precautions posted. Pt is at risk for aspiration of oral intake d/t decreased Cognitive/mental status, declined medical status, and extended illness including Surgery. Pt also has alternative means of feeding via Dobhoff ongoing for nutritional support -- recommend this continue as pt is NOT strong enough for stamina/attention to meet nutritional needs orally(3 meals, supplements) at this time.     HPI HPI: Pt is an 79 y.o. female with medical history significant of hypertension, hyperlipidemia, diet-controlled diabetes, COPD, asthma, GERD, ESRD-HD (MWF), OSA, CAD, bladder cancer, iron deficiency anemia, pt is admitted by Dr. Kizzie Bane for s/p of left total hip arthroplasty.  During this admission, Transferred to ICU for severe resp distress, severe metabolic acidosis and encephalopathy. She failed BiPAP and was emergently orally intubated on 09/06/19-09/08/2019 post seizure-like activity noted.       SLP Plan  Continue with current plan of care       Recommendations  Diet recommendations: Nectar-thick liquid(on a clear liquid diet; pt has Dobhoff) Liquids provided via: Teaspoon;Cup;Straw Medication Administration: Via alternative means(NG tube) Supervision: Staff to assist with self feeding;Full supervision/cueing for compensatory strategies Compensations: Minimize environmental distractions;Slow rate;Small sips/bites;Lingual sweep for clearance of pocketing;Multiple dry swallows after each bite/sip;Follow solids with liquid Postural Changes and/or  Swallow Maneuvers: Seated upright 90 degrees;Upright 30-60 min after meal  General recommendations: (Dietician following) Oral Care Recommendations: Oral care BID;Oral care before and after PO;Staff/trained caregiver to provide oral care Follow up Recommendations: Skilled Nursing facility SLP Visit Diagnosis: Dysphagia, oropharyngeal phase (R13.12);Other (comment)(Cognitive decline) Plan: Continue with current plan of care       [place], [place], CCC-SLP Haziel Molner 09/14/2019, 2:57 PM

## 2019-09-14 NOTE — Progress Notes (Signed)
Physical Therapy Re-Evaluation & Treatment Patient Details Name: Barbara [name] MRN: [id number] DOB: 02-05-1941 Today's Date: 09/14/2019    History of Present Illness Per MD:  "79 y.o. female with medical history significant of hypertension, hyperlipidemia, diet-controlled diabetes, COPD, asthma, GERD, ESRD-HD (MWF), OSA, CAD, bladder cancer, iron deficiency anemia.  She was admitted by Dr. Roland Rack for left total hip arthroplasty.  She developed acute respiratory distress after surgery.  She was subsequently transferred to the ICU for acute hypoxemic and hypercapnic respiratory failure.  Initially, she was treated with BiPAP.  However, she failed BiPAP therapy and required intubation and mechanical ventilation in the ICU.  She was also treated with IV antibiotics for aspiration pneumonia." Pt also recieved CRRT and transitioned to HD during this stay, as well as vasopressors for hypotension, MRI performed due to AMS and seizure like activity, MRI negative for acute findings. Cardiology consulted for bradycardia and sinus pauses, no indication for pacemaker, conservative approach. Pt extubated 09/08/2019 and transferred to medical floor.    PT Comments    Patient seen at this time for a re-evaluation due to change in medical status, goals adjusted as appropriate. OT in room upon PT entering, patient alert, oriented to self and husband, disoriented x3. Difficulty assessing pt's ability to perform LE/UE movement, pt able to move UE intermittently on command, but free movement noted during session, profound weakness noted. PROM for RLE and LLE exercises, pt exhibited mod pain signs/symptoms with all LLE movements. Further mobility deferred due to patient pain and cognition status.  Overall the patient demonstrated deficits (see "PT Problem List") that impede the patient's functional abilities, safety, and mobility and would benefit from skilled PT intervention. Recommendation for SNF remains appropriate, frequency  downgraded at this time due to limited cognition.       Follow Up Recommendations  SNF     Equipment Recommendations  Other (comment)(TBD on pt progress)    Recommendations for Other Services OT consult     Precautions / Restrictions Precautions Precautions: Posterior Hip;Fall Precaution Comments: NG tube Restrictions Weight Bearing Restrictions: Yes LLE Weight Bearing: Weight bearing as tolerated    Mobility  Bed Mobility Overal bed mobility: Needs Assistance             General bed mobility comments: Deferred for pt/therapist safety. Pt with active tube feed and decreased cognition this date. Unsafe to attempt mobilty.  Transfers                 General transfer comment: deferred  Ambulation/Gait                 Stairs             Wheelchair Mobility    Modified Rankin (Stroke Patients Only)       Balance                                            Cognition Arousal/Alertness: Lethargic Behavior During Therapy: Flat affect Overall Cognitive Status: Impaired/Different from baseline                                 General Comments: Pt A&O to self only. States "I was in the hospital, I left yesterday". Pt is noted to stare at celling t/o session and asks this therapist to "knock it  down". Pt spouse at bedside attempts to re-direct pt, however pt remains confused t/o session. Requires multimodal prompting to follow 1 step VCs consistently t/o session.      Exercises Total Joint Exercises Heel Slides: PROM;Left;20 reps;Right;10 reps Hip ABduction/ADduction: PROM;Right;10 reps;Left;20 reps General Exercises - Upper Extremity Shoulder Flexion: PROM;Both;5 reps Shoulder ABduction: PROM;Both;5 reps Shoulder ADduction: PROM;Both;5 reps Shoulder Horizontal ABduction: PROM;Both;5 reps Shoulder Horizontal ADduction: PROM;Both;5 reps Elbow Flexion: PROM;Both;5 reps Elbow Extension: PROM;Both;5 reps Wrist  Flexion: PROM;Left;5 reps Wrist Extension: PROM;Left;5 reps Other Exercises Other Exercises: Caregiver educated on posterior hip precautions, pt positioning    General Comments        Pertinent Vitals/Pain Pain Assessment: Faces Faces Pain Scale: Hurts little more Pain Location: with any joint movement this session, especially L hip Pain Descriptors / Indicators: Grimacing;Guarding;Moaning Pain Intervention(s): Limited activity within patient's tolerance;Monitored during session;Repositioned    Home Living Family/patient expects to be discharged to:: Private residence Living Arrangements: Spouse/significant other Available Help at Discharge: Available 24 hours/day Type of Home: House Home Access: Ramped entrance   Home Layout: One level Home Equipment: Toilet riser;Bedside commode;Walker - 4 wheels;Shower seat      Prior Function Level of Independence: Independent with assistive device(s)      Comments: Pt husband reported pt uses furniture in house and [facility] for ambulation.   PT Goals (current goals can now be found in the care plan section) Acute Rehab PT Goals Patient Stated Goal: to get back to normal PT Goal Formulation: With family Time For Goal Achievement: 09/28/19 Potential to Achieve Goals: Fair Progress towards PT goals: Goals downgraded-see care plan    Frequency    7X/week      PT Plan Frequency needs to be updated;Equipment recommendations need to be updated    Co-evaluation   Reason for Co-Treatment: Complexity of the patient's impairments (multi-system involvement);Necessary to address cognition/behavior during functional activity;To address functional/ADL transfers;For patient/therapist safety PT goals addressed during session: Mobility/safety with mobility;Balance;Strengthening/ROM OT goals addressed during session: ADL's and self-care;Proper use of Adaptive equipment and DME;Strengthening/ROM      AM-PAC PT "6 Clicks" Mobility   Outcome  Measure  Help needed turning from your back to your side while in a flat bed without using bedrails?: Total Help needed moving from lying on your back to sitting on the side of a flat bed without using bedrails?: Total Help needed moving to and from a bed to a chair (including a wheelchair)?: Total Help needed standing up from a chair using your arms (e.g., wheelchair or bedside chair)?: Total Help needed to walk in hospital room?: Total Help needed climbing 3-5 steps with a railing? : Total 6 Click Score: 6    End of Session Equipment Utilized During Treatment: Oxygen(2L) Activity Tolerance: Patient limited by pain Patient left: in bed;with call bell/phone within reach;with SCD's reapplied;with family/visitor present Nurse Communication: Mobility status PT Visit Diagnosis: Muscle weakness (generalized) (M62.81);Difficulty in walking, not elsewhere classified (R26.2);Other abnormalities of gait and mobility (R26.89);Pain Pain - Right/Left: Left Pain - part of body: Hip     Time: 1346-1410 PT Time Calculation (min) (ACUTE ONLY): 24 min  Charges:  $Therapeutic Exercise: 8-22 mins                     Lieutenant Diego PT, DPT 4:08 PM,09/14/19

## 2019-09-15 LAB — CBC
HCT: 28.4 % — ABNORMAL LOW (ref 36.0–46.0)
Hemoglobin: 8.6 g/dL — ABNORMAL LOW (ref 12.0–15.0)
MCH: 30.7 pg (ref 26.0–34.0)
MCHC: 30.3 g/dL (ref 30.0–36.0)
MCV: 101.4 fL — ABNORMAL HIGH (ref 80.0–100.0)
Platelets: 434 10*3/uL — ABNORMAL HIGH (ref 150–400)
RBC: 2.8 MIL/uL — ABNORMAL LOW (ref 3.87–5.11)
RDW: 15 % (ref 11.5–15.5)
WBC: 19.7 10*3/uL — ABNORMAL HIGH (ref 4.0–10.5)
nRBC: 0.1 % (ref 0.0–0.2)

## 2019-09-15 LAB — BASIC METABOLIC PANEL
Anion gap: 14 (ref 5–15)
BUN: 81 mg/dL — ABNORMAL HIGH (ref 8–23)
CO2: 27 mmol/L (ref 22–32)
Calcium: 8.5 mg/dL — ABNORMAL LOW (ref 8.9–10.3)
Chloride: 101 mmol/L (ref 98–111)
Creatinine, Ser: 7.8 mg/dL — ABNORMAL HIGH (ref 0.44–1.00)
GFR calc Af Amer: 5 mL/min — ABNORMAL LOW (ref >60)
GFR calc non Af Amer: 4 mL/min — ABNORMAL LOW (ref >60)
Glucose, Bld: 126 mg/dL — ABNORMAL HIGH (ref 70–99)
Potassium: 3.8 mmol/L (ref 3.5–5.1)
Sodium: 142 mmol/L (ref 135–145)

## 2019-09-15 LAB — GLUCOSE, CAPILLARY
Glucose-Capillary: 124 mg/dL — ABNORMAL HIGH (ref 70–99)
Glucose-Capillary: 137 mg/dL — ABNORMAL HIGH (ref 70–99)
Glucose-Capillary: 130 mg/dL — ABNORMAL HIGH (ref 70–99)
Glucose-Capillary: 145 mg/dL — ABNORMAL HIGH (ref 70–99)
Glucose-Capillary: 123 mg/dL — ABNORMAL HIGH (ref 70–99)

## 2019-09-15 LAB — MAGNESIUM: Magnesium: 2.5 mg/dL — ABNORMAL HIGH (ref 1.7–2.4)

## 2019-09-15 LAB — PROCALCITONIN: Procalcitonin: 4.91 ng/mL

## 2019-09-15 LAB — PHOSPHORUS: Phosphorus: 4 mg/dL (ref 2.5–4.6)

## 2019-09-15 NOTE — Plan of Care (Signed)

## 2019-09-15 NOTE — Progress Notes (Signed)
Reason for consult: Altered mental status   Subjective: patient more alert and oriented today, oriented to herself and place. Following commands.    ROS: negative except above   Examination  Vital signs in last 24 hours: Temp:  [98.2 F (36.8 C)-99.7 F (37.6 C)] 98.3 F (36.8 C) (02/26 1250) Pulse Rate:  [73-100] 82 (02/26 1245) Resp:  [16-33] 24 (02/26 1245) BP: (93-134)/(40-60) 129/57 (02/26 1250) SpO2:  [96 %-100 %] 100 % (02/26 1250) Weight:  [83.8 kg] 83.8 kg (02/26 0500)  General: lying in bed CVS: pulse-normal rate and rhythm RS: breathing comfortably Extremities: normal   Neuro: MS: Alert, oriented, follows commands CN: pupils equal and reactive,  EOMI, face symmetric, tongue midline, normal sensation over face, Motor: antigravity in both UE, 2/5 strength in b/l LE  Reflexes:plantars: flexor Coordination: normal Gait: not tested  Basic Metabolic Panel: Recent Labs  Lab 09/09/19 0433 09/10/19 0420 09/11/19 0832 09/11/19 0832 09/12/19 0630 09/13/19 0737 09/13/19 0737 09/13/19 1217 09/14/19 0303 09/15/19 0433  NA 135   < > 137  --   --  140  --  141 142 142  K 4.9   < > 4.0  --   --  5.1  --  3.9 3.8 3.8  CL 99   < > 101  --   --  101  --  100 99 101  CO2 25   < > 21*  --   --  23  --  25 30 27   GLUCOSE 115*   < > 101*  --   --  177*  --  144* 126* 126*  BUN 24*   < > 89*  --   --  88*  --  88* 53* 81*  CREATININE 3.45*   < > 7.36*  --   --  7.33*  --  7.46* 5.56* 7.80*  CALCIUM 8.2*   < > 8.2*   < >  --  8.2*   < > 8.2* 8.5* 8.5*  MG 2.5*  --   --   --  2.5*  --   --   --  2.1 2.5*  PHOS 3.6  --  4.4  --  1.9*  --   --  2.3*  --  4.0   < > = values in this interval not displayed.    CBC: Recent Labs  Lab 09/09/19 0433 09/10/19 0420 09/12/19 1021 09/13/19 0835 09/13/19 1217 09/14/19 0303 09/15/19 0433  WBC 18.3*   < > 13.9* 16.4* 17.1* 17.6* 19.7*  NEUTROABS 17.0*  --   --   --   --  14.0*  --   HGB 10.9*   < > 10.4* 10.1* 9.6* 9.1* 8.6*   HCT 35.0*   < > 32.4* 32.4* 30.9* 29.9* 28.4*  MCV 100.9*   < > 98.2 100.3* 99.7 100.0 101.4*  PLT 120*   < > 247 325 343 336 434*   < > = values in this interval not displayed.     Coagulation Studies: No results for input(s): LABPROT, INR in the last 72 hours.  Imaging Reviewed:      ASSESSMENT AND PLAN  79 y.o.femalewith multiple medical problems including ESRD on hemodialysis who was admitted 2/16 for left total hip arthroplasty.Developed severe metabolic acidosis and encephalopathy on 2/17 and neurology consulted.EEG showeddiscontinuous general background slowing with rare, superimposed, generalized polyspike activity with frontal predominance.Was started on Vimpat due to suspicion of seizures. MRI Brain showed no acute changes.  Per Dr.  Reynolds, the patient has had episodes in the past when she became unresponsive with illnesses and required extensive amount of time for recovery.  Neurology reconsulted for worsening speech and mental status. Per neurology note on 09/09/19 she was not verbal, not following commands. Per hospitalist note, patient lethargic, only oriented to person.   Patient continues to improve. EEG on 2/24: showed moderate to severe diffuse encephalopathy. No seizures or definite  epileptiform discharges were seen throughout the recording.   Ammonia level : normal  B12: 3 years ago was 311, repeat B12- 478 BUN : 88 on 09/17/19 >> 81 ABG; pC02 36 B1: pending   Acute metabolic encephalopathy, improving - ? uremia, sepsis,  Generalized weakness likely from chronic deconditioning   Recommendation Continue unasyn, avoid cefepime  Continue Vimpat  Seizure precautions   F/U with Neurology outpatient   Digby Groeneveld [facility] Pager Number [phone] For questions after 7pm please refer to AMION to reach the Neurologist on call

## 2019-09-15 NOTE — Progress Notes (Signed)
Physical Therapy Treatment Patient Details Name: Barbara [name] MRN: [id number] DOB: 26-Feb-1941 Today's Date: 09/15/2019    History of Present Illness Per MD:  "79 y.o. female with medical history significant of hypertension, hyperlipidemia, diet-controlled diabetes, COPD, asthma, GERD, ESRD-HD (MWF), OSA, CAD, bladder cancer, iron deficiency anemia.  She was admitted by Dr. Roland Rack for left total hip arthroplasty.  She developed acute respiratory distress after surgery.  She was subsequently transferred to the ICU for acute hypoxemic and hypercapnic respiratory failure.  Initially, she was treated with BiPAP.  However, she failed BiPAP therapy and required intubation and mechanical ventilation in the ICU.  She was also treated with IV antibiotics for aspiration pneumonia." Pt also recieved CRRT and transitioned to HD during this stay, as well as vasopressors for hypotension, MRI performed due to AMS and seizure like activity, MRI negative for acute findings. Cardiology consulted for bradycardia and sinus pauses, no indication for pacemaker, conservative approach. Pt extubated 09/08/2019 and transferred to medical floor.    PT Comments    Pt much more alert this session, oriented to her name and location. Pt much more irritable/agitated with PT. Attempted RLE knee slides performed a few reps prior to pt stating "I ain't having no baby" and "dont you move it". Attempted on LLE, pt very resistive to movement and despite education and gentle encouragement, adamant about PT leaving her alone, stated she would move for the doctor only. Pt and PT also discussed PT role, POC, and importance of mobility/movement during hospital stay, pt resistant to education. Pt also stated "I don't have my yellow socks on" which was correct, totalA to don per pt demand. Pt repositioned and all needs in reach. The patient would benefit from further skilled PT intervention to maximize mobility, safety, and independence.      Follow  Up Recommendations  SNF     Equipment Recommendations  Other (comment)(TBD on patient progress)    Recommendations for Other Services OT consult     Precautions / Restrictions Precautions Precautions: Posterior Hip;Fall Precaution Booklet Issued: Yes (comment) Precaution Comments: NG tube Restrictions Weight Bearing Restrictions: Yes LLE Weight Bearing: Weight bearing as tolerated    Mobility  Bed Mobility               General bed mobility comments: Pt unwilling/unable to understand task  Transfers                 General transfer comment: deferred  Ambulation/Gait                 Stairs             Wheelchair Mobility    Modified Rankin (Stroke Patients Only)       Balance                                            Cognition Arousal/Alertness: Awake/alert   Overall Cognitive Status: Impaired/Different from baseline                                 General Comments: Pt oriented to self and location, some tangential conversation " i am not having no baby". pt easily irritated this session      Exercises Other Exercises Other Exercises: PT attempted ROM on bilateral LE, x5 knee flexion on RLE,  pt very resistive to LLE movement/exercises. clearly stated "leave me a lone" and "you ain't know doctor". Education provided about importance of mobility and PT role, pt resistant this AM.    General Comments        Pertinent Vitals/Pain Pain Assessment: Faces Faces Pain Scale: Hurts little more Pain Location: with any joint movement this session, especially L hip Pain Descriptors / Indicators: Grimacing;Guarding;Moaning Pain Intervention(s): Limited activity within patient's tolerance;Monitored during session    Home Living                      Prior Function            PT Goals (current goals can now be found in the care plan section) Progress towards PT goals: Progressing toward  goals    Frequency    7X/week      PT Plan Current plan remains appropriate    Co-evaluation              AM-PAC PT "6 Clicks" Mobility   Outcome Measure  Help needed turning from your back to your side while in a flat bed without using bedrails?: Total Help needed moving from lying on your back to sitting on the side of a flat bed without using bedrails?: Total Help needed moving to and from a bed to a chair (including a wheelchair)?: Total Help needed standing up from a chair using your arms (e.g., wheelchair or bedside chair)?: Total Help needed to walk in hospital room?: Total Help needed climbing 3-5 steps with a railing? : Total 6 Click Score: 6    End of Session Equipment Utilized During Treatment: Oxygen;Other (comment)(2L) Activity Tolerance: Treatment limited secondary to agitation Patient left: in bed;with call bell/phone within reach;with SCD's reapplied Nurse Communication: Mobility status PT Visit Diagnosis: Muscle weakness (generalized) (M62.81);Difficulty in walking, not elsewhere classified (R26.2);Other abnormalities of gait and mobility (R26.89);Pain Pain - Right/Left: Left Pain - part of body: Hip     Time: 2233-6122 PT Time Calculation (min) (ACUTE ONLY): 15 min  Charges:  $Therapeutic Exercise: 8-22 mins                    Lieutenant Diego PT, DPT 9:11 AM,09/15/19

## 2019-09-15 NOTE — Progress Notes (Signed)
Speech Language Pathology Treatment: Dysphagia  Patient Details Name: Barbara [name] MRN: [id number] DOB: 1940/09/07 Today's Date: 09/15/2019 Time: 0940-1010 SLP Time Calculation (min) (ACUTE ONLY): 30 min  Assessment / Plan / Recommendation Clinical Impression  Pt seen for ongoing assessment of swallowing and toleration of po's in hopes to establish a least restrictive oral diet.Ptpresents w/more alertness and verbal engagement w/ SLP today -- oriented to name and "[place]". Her verbalizations were clearer w/ less mumbled speech, and she indicated a few more wants/needs. She continues w/ an NG tube for nutritional support - a Dobhoff. Intermittent wetvocal quality noted at Baseline. Pt wasbetter able tofollow through w/directions including to Cough inattempt to expectorate phlegm.Pt's eyes remainedopened during thissession;verbal/tactile cuesgiven intermittently during session for continued follow through w/ tasks(improving overall). She required verbal encouragement for follow through w/ drinking liquids -- she stated she wanted to "rest" often.   Pt was positioned upright and presented w/ trials of Nectar liquids via cup/straw. Pt required min-mod verbal/tactile cuesforattention to po tasks. Pt accepted po trials to lips w/ immediatemouth opening. Pt consistently exhibited grossly adequate bolus management and timely A-P transfer for swallowing.SLP gave verbal cues to swallowa 2nd time to clear any pharyngeal residue. Laryngeal excursionadequate. No immediate, overt coughing noted. Vocal qualitywas fairly dry/clear between trials assessed. Ptexhibited No decline in respiratory status or status in general at end of po trials/session: ~4 ozs of Nectar liquids. Oral care given again at end of session.  Discussed pt's presentation w/ MD and the need to continue w/ NG tube feeding support at this time to meet Nutritionl needs safely and adequately.Recommend a clear liquid diet of  Nectar liquids via cup/straw; strict aspiration precautions to include 1-2 extra swallows to clear mouth/throat w/ bites/sips, and give po's only when FULLY alert and awake. Recommend frequent oral care for hygiene and stimulation of swallowing; ice chips w/ NSG supervision for pleasure and stimulation of swallowing.STOPany oral intake/meal/ice chipsif s/s aspiration noted. ST services will f/u w/ pt's status next 1-3 days. Precautions posted.Pt isat risk for aspiration of oral intake d/t decreased Cognitive/mental status, declined medical status, and extended illness including Surgery. Pt also has alternative means of feeding via Dobhoff ongoing for nutritional support -- recommend this continue as pt is NOT strong enough for stamina/attention to meet nutritional needs orally(3 meals, supplements) at this time.     HPI HPI: Pt is an 79 y.o. female with medical history significant of hypertension, hyperlipidemia, diet-controlled diabetes, COPD, asthma, GERD, ESRD-HD (MWF), OSA, CAD, bladder cancer, iron deficiency anemia, pt is admitted by Dr. Kizzie Bane for s/p of left total hip arthroplasty.  During this admission, Transferred to ICU for severe resp distress, severe metabolic acidosis and encephalopathy. She failed BiPAP and was emergently orally intubated on 09/06/19-09/08/2019 post seizure-like activity noted.       SLP Plan  Continue with current plan of care       Recommendations  Diet recommendations: Nectar-thick liquid(clear liquid diet) Liquids provided via: Teaspoon;Cup;Straw Medication Administration: Via alternative means(NG tube) Supervision: Staff to assist with self feeding;Full supervision/cueing for compensatory strategies Compensations: Minimize environmental distractions;Slow rate;Small sips/bites;Lingual sweep for clearance of pocketing;Multiple dry swallows after each bite/sip;Follow solids with liquid Postural Changes and/or Swallow Maneuvers: Seated upright 90 degrees;Upright  30-60 min after meal                General recommendations: (Dietician and Palliative care f/u) Oral Care Recommendations: Oral care BID;Oral care before and after PO;Staff/trained caregiver to provide oral care Follow  up Recommendations: Skilled Nursing facility SLP Visit Diagnosis: Dysphagia, oropharyngeal phase (R13.12);Other (comment)(Cognitive decline) Plan: Continue with current plan of care       Barbara [name], [place], CCC-SLP Thelia Tanksley 09/15/2019, 2:09 PM

## 2019-09-15 NOTE — Care Management Important Message (Signed)
Important Message  Patient Details  Name: Barbara [name] MRN: [id number] Date of Birth: 1941-06-17   Medicare Important Message Given:  Yes     Juliann Pulse A Kyng Matlock 09/15/2019, 11:41 AM

## 2019-09-15 NOTE — Progress Notes (Signed)
PROGRESS NOTE    Barbara [name]  TOI:[id number] DOB: 04/15/1941 DOA: 09/05/2019  PCP: [name], MD    LOS - 10   Brief Narrative:  Medical records reviewed and are as summarized below:  Barbara [name] is an 79 y.o. female with medical history significant of hypertension, hyperlipidemia, diet-controlled diabetes, COPD, asthma, GERD, ESRD-HD (MWF), OSA, CAD, bladder cancer, iron deficiency anemia.  She was admitted by Dr. Kizzie Bane for lefttotal hip arthroplasty.  She developed acute respiratory distress after surgery.  She was subsequently transferred to the ICU for acute hypoxemic and hypercapnic respiratory failure.  Initially, she was treated with BiPAP.  However, she failed BiPAP therapy and required intubation and mechanical ventilation in the ICU.  She was also treated with IV antibiotics for aspiration pneumonia.  She was also seen by the nephrologist for end-stage renal disease.  She was initially placed on CRRT and then transition to hemodialysis. She required vasopressors for hypotension.  She was seen by the neurologist for altered mental status and seizure activity.  MRI brain did not show any acute stroke.  Neurologist recommended Vimpat and monitoring for seizure  She was also seen by the cardiologist because of bradycardia and sinus pauses on telemetry.  Per cardiologist, there was no indication for permanent pacemaker and he recommended a conservative approach.  She was extubated on 09/08/2019.  Her condition was slowly improving so she was was transferred to the medical floor for continued medical management.   Subjective 2/26: Patient seen today after dialysis, hospital bedside.  No acute events reported overnight.  Patient continues to aspirate with nectar thick liquids.  Tube feeds by Dobbhoff running continuous.  She is slow to respond to questions, even with telling me her name.  Says the year is 19.  Per husband this confusion seems to wax and wane and is worse  after dialysis.  Assessment & Plan:   Principal Problem:   Status post total hip replacement, left Active Problems:   Anemia in chronic kidney disease   COPD (chronic obstructive pulmonary disease) (HCC)   CAD in native artery   Benign essential HTN   Acid reflux   Seizure (HCC)   Type II diabetes mellitus with renal manifestations (HCC)   ESRD on dialysis ([place])   Aspiration pneumonia ([place])   Acute encephalopathy   Goals of care, counseling/discussion    Dysphagia:  NG tube malposition.  On 09/13/2019. X-ray abdomen showed no evidence of NG tube in place. X-ray chest was also not able to see the NG tube. NG tube was removed as it was found to be coiling up in oropharynx. Replacement of the NG tube was not successful by bedside RN. Now has Dobhoff in place with continuous tube feeds running.  Aspiration pneumonia:  Initially treated with IV cefazolin, later on transition to IV Unasyn.  Cefazolin was discontinued due to persistent encephalopathy. Given that the patient apparently has another episode of aspiration on morning of 09/13/2019 with fever we transition her back to IV Zosyn. Continue antibiotics up to 230 2020.  Acute toxic metabolic encephalopathy: History of seizures Improving. Continue Vimpat.   Chart review shows that patient has had similar episodes of encephalopathy in the past and required several days to recover.  Continue seizure precaution. Appreciate neurology assistance.  EEG unremarkable. Avoid cephalosporin.  Acute hypoxemic and hypercapnic respiratory failure: Resolved.  Extubated on 09/08/2019.  She is currently on room air. Patient was briefly on NRB while she was at HD on 09/13/2019 but  that is likely from coiling of the NG tube.  ESRD on hemodialysis:  Follow-up with nephrologist for hemodialysis.  Per neurology uremia can also contribute to patient's encephalopathy. Schedule days are Mondays, Wednesdays and Fridays.  Chronic diastolic  CHF:  Hemodialysis for fluid management.   2D echo on 09/07/2019 showed EF estimated at 60 to 60%, grade 1 diastolic dysfunction.  Dysphagia:  Likely from deconditioning as well as encephalopathy. Currently requiring tube feeds. Dietary following. Speech therapy also consulted.  Leukocytosis and thrombocytopenia: Stable.  Bradycardia: Improved  Suspected ileus: Resolved  MRSA colonization of nares: Completed Bactroban  Body mass index is 29.36 kg/m.  (Obesity)  Overall poor prognosis.  Palliative care following.     DVT prophylaxis: heparin   Code Status: DNR  Family Communication: husband at bedside during encounter   Disposition Plan: Expect d/c to SNF pending improvement in dysphagia or long-term nutrition needs addressed (PEG needed?).  Patient continues to require hospital care at this time due to inability to swallow safely, requiring tube feeding for nutrition.  Coming From Home Exp DC Date TBD Barriers dysphagia with recurrent aspiration, requiring tube feeds Medically Stable for Discharge? No  Consultants:   Nephrology  Pulmonology  Neurology  Palliative Care  TOC     Objective: Vitals:   09/14/19 0818 09/14/19 1552 09/15/19 0037 09/15/19 0500  BP: (!) 116/53 (!) 130/44 (!) 127/50   Pulse: 84 88 88   Resp: 18 18 17    Temp: 98.6 F (37 C) 99 F (37.2 C) 99.7 F (37.6 C)   TempSrc:      SpO2: 91% 96% 97%   Weight:    83.8 kg  Height:        Intake/Output Summary (Last 24 hours) at 09/15/2019 0742 Last data filed at 09/15/2019 0500 Gross per 24 hour  Intake 783.29 ml  Output --  Net 783.29 ml   Filed Weights   09/13/19 1300 09/14/19 0500 09/15/19 0500  Weight: 84.1 kg 82.5 kg 83.8 kg    Examination:  General exam: awake, alert, no acute distress HEENT: Dobhoff in place with continuous tube feeds running, moist mucus membranes, hearing grossly normal  Respiratory system: CTAB, no wheezes, rales or rhonchi, normal respiratory  effort. Cardiovascular system: normal S1/S2, RRR, no pedal edema.   Gastrointestinal system: soft, NT, ND, no HSM felt, +bowel sounds. Central nervous system: Oriented only to self today. Delayed speech somewhat difficult to understand.  Not following commands well (would not squeeze hands, take deep breath) Extremities: moves all, no edema, normal tone Skin: dry, intact, normal temperature Psychiatry: normal mood, congruent affect    Data Reviewed: I have personally reviewed following labs and imaging studies  CBC: Recent Labs  Lab 09/09/19 0433 09/10/19 0420 09/12/19 1021 09/13/19 0835 09/13/19 1217 09/14/19 0303 09/15/19 0433  WBC 18.3*   < > 13.9* 16.4* 17.1* 17.6* 19.7*  NEUTROABS 17.0*  --   --   --   --  14.0*  --   HGB 10.9*   < > 10.4* 10.1* 9.6* 9.1* 8.6*  HCT 35.0*   < > 32.4* 32.4* 30.9* 29.9* 28.4*  MCV 100.9*   < > 98.2 100.3* 99.7 100.0 101.4*  PLT 120*   < > 247 325 343 336 434*   < > = values in this interval not displayed.   Basic Metabolic Panel: Recent Labs  Lab 09/09/19 0433 09/10/19 0420 09/11/19 4540 09/12/19 0630 09/13/19 0737 09/13/19 1217 09/14/19 0303 09/15/19 0433  NA 135   < >  137  --  140 141 142 142  K 4.9   < > 4.0  --  5.1 3.9 3.8 3.8  CL 99   < > 101  --  101 100 99 101  CO2 25   < > 21*  --  23 25 30 27   GLUCOSE 115*   < > 101*  --  177* 144* 126* 126*  BUN 24*   < > 89*  --  88* 88* 53* 81*  CREATININE 3.45*   < > 7.36*  --  7.33* 7.46* 5.56* 7.80*  CALCIUM 8.2*   < > 8.2*  --  8.2* 8.2* 8.5* 8.5*  MG 2.5*  --   --  2.5*  --   --  2.1 2.5*  PHOS 3.6  --  4.4 1.9*  --  2.3*  --  4.0   < > = values in this interval not displayed.   GFR: Estimated Creatinine Clearance: 6.4 mL/min (A) (by C-G formula based on SCr of 7.8 mg/dL (H)). Liver Function Tests: Recent Labs  Lab 09/09/19 0433 09/11/19 0832 09/13/19 0737 09/13/19 1217 09/14/19 0303  AST  --   --  50*  --  37  ALT  --   --  13  --  10  ALKPHOS  --   --  67  --  63   BILITOT  --   --  0.5  --  0.5  PROT  --   --  7.3  --  6.9  ALBUMIN 2.4* 2.3* 2.2* 2.3* 2.1*   No results for input(s): LIPASE, AMYLASE in the last 168 hours. Recent Labs  Lab 09/12/19 1704  AMMONIA 24   Coagulation Profile: No results for input(s): INR, PROTIME in the last 168 hours. Cardiac Enzymes: No results for input(s): CKTOTAL, CKMB, CKMBINDEX, TROPONINI in the last 168 hours. BNP (last 3 results) No results for input(s): PROBNP in the last 8760 hours. HbA1C: No results for input(s): HGBA1C in the last 72 hours. CBG: Recent Labs  Lab 09/14/19 1134 09/14/19 1644 09/14/19 2039 09/15/19 0055 09/15/19 0355  GLUCAP 113* 133* 124* 137* 124*   Lipid Profile: No results for input(s): CHOL, HDL, LDLCALC, TRIG, CHOLHDL, LDLDIRECT in the last 72 hours. Thyroid Function Tests: No results for input(s): TSH, T4TOTAL, FREET4, T3FREE, THYROIDAB in the last 72 hours. Anemia Panel: Recent Labs    09/12/19 1900  VITAMINB12 478   Sepsis Labs: Recent Labs  Lab 09/14/19 0303 09/15/19 0433  PROCALCITON 5.86 4.91    Recent Results (from the past 240 hour(s))  MRSA PCR Screening     Status: Abnormal   Collection Time: 09/06/19  7:50 AM   Specimen: Nasal Mucosa; Nasopharyngeal  Result Value Ref Range Status   MRSA by PCR POSITIVE (A) NEGATIVE Final    Comment:        The GeneXpert MRSA Assay (FDA approved for NASAL specimens only), is one component of a comprehensive MRSA colonization surveillance program. It is not intended to diagnose MRSA infection nor to guide or monitor treatment for MRSA infections. RESULT CALLED TO, READ BACK BY AND VERIFIED WITH: MEGAN SHEFFIELD AT 0931 ON 09/06/2019 [place]. Performed at [facility], [address], [place], [place] [address]          Radiology Studies: EEG  Result Date: 09/13/2019 Lora Havens, MD     09/13/2019  5:50 PM Patient Name: Barbara [name] MRN: [id number] Epilepsy Attending: Lora Havens  Referring Physician/Provider: Dr Karena Addison  [place] Date: 09/13/2019 Duration: 29.14 mins Patient history: 79 y.o.femalewith multiple medical problems including ESRD on hemodialysis who was admitted 2/16 for left total hip arthroplasty.Neurology reconsulted for worsening speech and mental status. Level of alertness: awake AEDs during EEG study: Vimpat Technical aspects: This EEG study was done with scalp electrodes positioned according to the 10-20 International system of electrode placement. Electrical activity was acquired at a sampling rate of 500Hz  and reviewed with a high frequency filter of 70Hz  and a low frequency filter of 1Hz . EEG data were recorded continuously and digitally stored. DESCRIPTION: When patient appeared to be resting/drowsy, EEG showed continuous generalized 3-6hz  theta-delta slowing. When patient was awake/stimulated, EEG showed triphasic waves, generalized, maximal bifrontal at times at 1-1.5hz . Sharp transients were seen in left frontocentral region. Hyperventilation and photic stimulation were not performed. ABNORMALITY - Continuous slow, generalized - Triphasic waves, generalized, maximal bifrontal IMPRESSION: This study is suggestive of moderate to severe diffuse encephalopathy, non specific to etiology but could be secondary to toxic-metabolic causes. No seizures or definite  epileptiform discharges were seen throughout the recording. Lora Havens   DG Chest Port 1 View  Result Date: 09/13/2019 CLINICAL DATA:  Shortness of breath. EXAM: PORTABLE CHEST 1 VIEW COMPARISON:  Radiographs 09/13/2019 and 09/06/2019. CT 08/24/2019. FINDINGS: 1435 hours. The heart size and mediastinal contours are stable with a stable left subclavian venous stent. There are persistent low lung volumes with worsening right-greater-than-left airspace opacities, suspicious for pneumonia, possibly on the basis of aspiration. No edema or significant pleural effusion. The bones appear unchanged. IMPRESSION:  Worsening right-greater-than-left basilar airspace opacities suspicious for pneumonia, possibly on the basis of aspiration. Electronically Signed   By: Richardean Sale M.D.   On: 09/13/2019 16:56   DG Chest Port 1 View  Result Date: 09/13/2019 CLINICAL DATA:  NG tube placement EXAM: PORTABLE CHEST 1 VIEW COMPARISON:  09/06/2019 FINDINGS: Mild cardiomegaly. Left brachiocephalic and axillary vascular stents. No esophagogastric tube or other support apparatus are identified internal to the patient. Mild, diffuse interstitial pulmonary opacity. IMPRESSION: 1. No esophagogastric tube or other support apparatus identified internal to the patient, presumably malpositioned and coiled within the oropharynx. 2. Mild cardiomegaly and mild, diffuse interstitial pulmonary opacity, unchanged compared to prior examination. No new airspace opacity. These results will be called to the ordering clinician or representative by the Radiologist Assistant, and communication documented in the PACS or zVision Dashboard. Electronically Signed   By: Eddie Candle M.D.   On: 09/13/2019 09:17   DG Abd Portable 1V  Result Date: 09/13/2019 CLINICAL DATA:  Nasogastric tube placement. EXAM: PORTABLE ABDOMEN - 1 VIEW COMPARISON:  September 09, 2019. FINDINGS: The bowel gas pattern is normal. Nasogastric tube is not visualized. No radio-opaque calculi or other significant radiographic abnormality are seen. IMPRESSION: Nasogastric tube is not visualized. No evidence of bowel obstruction or ileus. Electronically Signed   By: Marijo Conception M.D.   On: 09/13/2019 10:34   DG [place] Dys Tube Plc W/Fl W/Rad  Result Date: 09/14/2019 CLINICAL DATA:  Dobbhoff tube needed. EXAM: NASO G TUBE PLACEMENT WITH FL AND WITH RAD CONTRAST:  None. FLUOROSCOPY TIME:  Fluoroscopy Time:  1 minutes 0 seconds Radiation Exposure Index (if provided by the fluoroscopic device): 14.1 mGy Number of Acquired Spot Images: 1 COMPARISON:  Portable chest and abdomen 09/13/2019  FINDINGS: Under fluoroscopic guidance a Dobbhoff tube was placed with its tip placed to the level of the distal stomach. No complications. Tube was secured to the nose with tape and  patient sent back to the floor in stable condition. IMPRESSION: Successful fluoroscopically directed Dobbhoff tube placement. Electronically Signed   By: Marcello Moores  Register   On: 09/14/2019 10:01        Scheduled Meds: . B-complex with vitamin C  1 tablet Per Tube QHS  . chlorhexidine  15 mL Mouth/Throat QID  . Chlorhexidine Gluconate Cloth  6 each Topical Q0600  . epoetin (EPOGEN/PROCRIT) injection  10,000 Units Intravenous Q M,W,F-HD  . feeding supplement (NEPRO CARB STEADY)  1,000 mL Oral Q24H  . feeding supplement (PRO-STAT SUGAR FREE 64)  30 mL Per Tube BID  . free water  50 mL Per Tube Q4H  . heparin injection (subcutaneous)  5,000 Units Subcutaneous Q12H  . mouth rinse  15 mL Mouth Rinse BID  . mupirocin ointment  1 application Nasal BID   Continuous Infusions: . sodium chloride    . famotidine (PEPCID) IV Stopped (09/14/19 1756)  . lacosamide (VIMPAT) IV 100 mg (09/14/19 2153)  . piperacillin-tazobactam (ZOSYN)  IV Stopped (09/15/19 0345)     LOS: 10 days    Time spent: 40 minutes    Ezekiel Slocumb, DO [facility]   If 7PM-7AM, please contact night-coverage [email or web address] 09/15/2019, 7:42 AM

## 2019-09-15 NOTE — Progress Notes (Signed)
[facility]  ROUNDING NOTE   Subjective:   Getting tube feeds.  Mental status improved.   Seen and examined on hemodialysis treatment. Tolerating treatment well. UF goal of 1.5 liters.     HEMODIALYSIS FLOWSHEET:  Blood Flow Rate (mL/min): 400 mL/min Arterial Pressure (mmHg): -230 mmHg Venous Pressure (mmHg): 180 mmHg Transmembrane Pressure (mmHg): 50 mmHg Ultrafiltration Rate (mL/min): 830 mL/min Dialysate Flow Rate (mL/min): 600 ml/min Conductivity: Machine : 13.7 Conductivity: Machine : 13.7 Dialysis Fluid Bolus: Normal Saline Bolus Amount (mL): 250 mL Dialysate Change: 2K    Objective:  Vital signs in last 24 hours:  Temp:  [98.2 F (36.8 C)-99.7 F (37.6 C)] 98.8 F (37.1 C) (02/26 0940) Pulse Rate:  [73-100] 88 (02/26 1045) Resp:  [16-33] 33 (02/26 1045) BP: (93-131)/(40-60) 125/44 (02/26 1045) SpO2:  [96 %-99 %] 98 % (02/26 0940) Weight:  [83.8 kg] 83.8 kg (02/26 0500)  Weight change: -1.921 kg Filed Weights   09/13/19 1300 09/14/19 0500 09/15/19 0500  Weight: 84.1 kg 82.5 kg 83.8 kg    Intake/Output: I/O last 3 completed shifts: In: 1037.3 [NG/GT:605; IV Piggyback:432.3] Out: -    Intake/Output this shift:  No intake/output data recorded.  Physical Exam: General: Laying in bed  Head: [place]/AT  Eyes: Anicteric  Neck: Supple, trachea midline  Lungs:  clear  Heart: regular  Abdomen:  Obese, soft  Extremities: No peripheral edema.  Neurologic: Not alert or oriented.   Skin: No lesions  Access: Left upper extremity AV graft    Basic Metabolic Panel: Recent Labs  Lab 09/09/19 0433 09/10/19 0420 09/11/19 0832 09/11/19 0832 09/12/19 0630 09/13/19 0737 09/13/19 0737 09/13/19 1217 09/14/19 0303 09/15/19 0433  NA 135   < > 137  --   --  140  --  141 142 142  K 4.9   < > 4.0  --   --  5.1  --  3.9 3.8 3.8  CL 99   < > 101  --   --  101  --  100 99 101  CO2 25   < > 21*  --   --  23  --  25 30 27   GLUCOSE 115*   < > 101*  --    --  177*  --  144* 126* 126*  BUN 24*   < > 89*  --   --  88*  --  88* 53* 81*  CREATININE 3.45*   < > 7.36*  --   --  7.33*  --  7.46* 5.56* 7.80*  CALCIUM 8.2*   < > 8.2*   < >  --  8.2*   < > 8.2* 8.5* 8.5*  MG 2.5*  --   --   --  2.5*  --   --   --  2.1 2.5*  PHOS 3.6  --  4.4  --  1.9*  --   --  2.3*  --  4.0   < > = values in this interval not displayed.    Liver Function Tests: Recent Labs  Lab 09/09/19 0433 09/11/19 0832 09/13/19 0737 09/13/19 1217 09/14/19 0303  AST  --   --  50*  --  37  ALT  --   --  13  --  10  ALKPHOS  --   --  67  --  63  BILITOT  --   --  0.5  --  0.5  PROT  --   --  7.3  --  6.9  ALBUMIN 2.4* 2.3* 2.2* 2.3* 2.1*   No results for input(s): LIPASE, AMYLASE in the last 168 hours. Recent Labs  Lab 09/12/19 1704  AMMONIA 24    CBC: Recent Labs  Lab 09/09/19 0433 09/10/19 0420 09/12/19 1021 09/13/19 0835 09/13/19 1217 09/14/19 0303 09/15/19 0433  WBC 18.3*   < > 13.9* 16.4* 17.1* 17.6* 19.7*  NEUTROABS 17.0*  --   --   --   --  14.0*  --   HGB 10.9*   < > 10.4* 10.1* 9.6* 9.1* 8.6*  HCT 35.0*   < > 32.4* 32.4* 30.9* 29.9* 28.4*  MCV 100.9*   < > 98.2 100.3* 99.7 100.0 101.4*  PLT 120*   < > 247 325 343 336 434*   < > = values in this interval not displayed.    Cardiac Enzymes: No results for input(s): CKTOTAL, CKMB, CKMBINDEX, TROPONINI in the last 168 hours.  BNP: Invalid input(s): POCBNP  CBG: Recent Labs  Lab 09/14/19 1644 09/14/19 2039 09/15/19 0055 09/15/19 0355 09/15/19 0803  GLUCAP 133* 124* 137* 58* 130*    Microbiology: Results for orders placed or performed during the hospital encounter of 09/05/19  MRSA PCR Screening     Status: Abnormal   Collection Time: 09/06/19  7:50 AM   Specimen: Nasal Mucosa; Nasopharyngeal  Result Value Ref Range Status   MRSA by PCR POSITIVE (A) NEGATIVE Final    Comment:        The GeneXpert MRSA Assay (FDA approved for NASAL specimens only), is one component of  a comprehensive MRSA colonization surveillance program. It is not intended to diagnose MRSA infection nor to guide or monitor treatment for MRSA infections. RESULT CALLED TO, READ BACK BY AND VERIFIED WITH: MEGAN SHEFFIELD AT 0931 ON 09/06/2019 [place]. Performed at [facility], [address] [place], [place] [address]     Coagulation Studies: No results for input(s): LABPROT, INR in the last 72 hours.  Urinalysis: No results for input(s): COLORURINE, LABSPEC, PHURINE, GLUCOSEU, HGBUR, BILIRUBINUR, KETONESUR, PROTEINUR, UROBILINOGEN, NITRITE, LEUKOCYTESUR in the last 72 hours.  Invalid input(s): APPERANCEUR    Imaging: EEG  Result Date: 09/13/2019 Lora Havens, MD     09/13/2019  5:50 PM Patient Name: Barbara [name] MRN: [id number] Epilepsy Attending: Lora Havens Referring Physician/Provider: Dr Karena Addison Aroor Date: 09/13/2019 Duration: 29.14 mins Patient history: 79 y.o.femalewith multiple medical problems including ESRD on hemodialysis who was admitted 2/16 for left total hip arthroplasty.Neurology reconsulted for worsening speech and mental status. Level of alertness: awake AEDs during EEG study: Vimpat Technical aspects: This EEG study was done with scalp electrodes positioned according to the 10-20 International system of electrode placement. Electrical activity was acquired at a sampling rate of 500Hz  and reviewed with a high frequency filter of 70Hz  and a low frequency filter of 1Hz . EEG data were recorded continuously and digitally stored. DESCRIPTION: When patient appeared to be resting/drowsy, EEG showed continuous generalized 3-6hz  theta-delta slowing. When patient was awake/stimulated, EEG showed triphasic waves, generalized, maximal bifrontal at times at 1-1.5hz . Sharp transients were seen in left frontocentral region. Hyperventilation and photic stimulation were not performed. ABNORMALITY - Continuous slow, generalized - Triphasic waves, generalized, maximal  bifrontal IMPRESSION: This study is suggestive of moderate to severe diffuse encephalopathy, non specific to etiology but could be secondary to toxic-metabolic causes. No seizures or definite  epileptiform discharges were seen throughout the recording. Lora Havens   DG Chest Port 1 View  Result Date: 09/13/2019  CLINICAL DATA:  Shortness of breath. EXAM: PORTABLE CHEST 1 VIEW COMPARISON:  Radiographs 09/13/2019 and 09/06/2019. CT 08/24/2019. FINDINGS: 1435 hours. The heart size and mediastinal contours are stable with a stable left subclavian venous stent. There are persistent low lung volumes with worsening right-greater-than-left airspace opacities, suspicious for pneumonia, possibly on the basis of aspiration. No edema or significant pleural effusion. The bones appear unchanged. IMPRESSION: Worsening right-greater-than-left basilar airspace opacities suspicious for pneumonia, possibly on the basis of aspiration. Electronically Signed   By: Richardean Sale M.D.   On: 09/13/2019 16:56   DG [place] Dys Tube Plc W/Fl W/Rad  Result Date: 09/14/2019 CLINICAL DATA:  Dobbhoff tube needed. EXAM: NASO G TUBE PLACEMENT WITH FL AND WITH RAD CONTRAST:  None. FLUOROSCOPY TIME:  Fluoroscopy Time:  1 minutes 0 seconds Radiation Exposure Index (if provided by the fluoroscopic device): 14.1 mGy Number of Acquired Spot Images: 1 COMPARISON:  Portable chest and abdomen 09/13/2019 FINDINGS: Under fluoroscopic guidance a Dobbhoff tube was placed with its tip placed to the level of the distal stomach. No complications. Tube was secured to the nose with tape and patient sent back to the floor in stable condition. IMPRESSION: Successful fluoroscopically directed Dobbhoff tube placement. Electronically Signed   By: Hazen   On: 09/14/2019 10:01     Medications:   . sodium chloride    . famotidine (PEPCID) IV Stopped (09/14/19 1756)  . lacosamide (VIMPAT) IV 100 mg (09/14/19 2153)  . piperacillin-tazobactam  (ZOSYN)  IV Stopped (09/15/19 0345)   . B-complex with vitamin C  1 tablet Per Tube QHS  . chlorhexidine  15 mL Mouth/Throat QID  . Chlorhexidine Gluconate Cloth  6 each Topical Q0600  . epoetin (EPOGEN/PROCRIT) injection  10,000 Units Intravenous Q M,W,F-HD  . feeding supplement (NEPRO CARB STEADY)  1,000 mL Oral Q24H  . feeding supplement (PRO-STAT SUGAR FREE 64)  30 mL Per Tube BID  . free water  50 mL Per Tube Q4H  . heparin injection (subcutaneous)  5,000 Units Subcutaneous Q12H  . mouth rinse  15 mL Mouth Rinse BID  . mupirocin ointment  1 application Nasal BID   acetaminophen (TYLENOL) oral liquid 160 mg/5 mL, acetaminophen, acetaminophen, bisacodyl, hydrALAZINE, ipratropium-albuterol, ondansetron **OR** ondansetron (ZOFRAN) IV  Assessment/ Plan:    [name] is a 79 y.o. black female with end stage renal disease on hemodialysis, hypertension, diabetes mellitus type II, diabetic neuropathy, seizure disorder who was admitted for left hip repair and unfortunate developed seizure episode postoperatively and subsequently intubated. Required vasopressors and placed on CRRT.   [facility] MWF [facility] LUE AVG 78.5kg   1.  ESRD on HD MWF. Tolerated hemodialysis treatment well yesterday    2.  Acute respiratory failure: intubated for airway protection. Extubated.  Weaned to room air Concern for aspiration pneumonia  - IV pip/tazo.   3.  Anemia of chronic kidney disease.  Hemoglobin 8.6 - EPO with HD treatment  4.  Secondary hyperparathyroidism.  Phosphorus low. Holding binders and cinacalcet  5. Hypertension: 125/44. Holding home blood pressure regimen of losartan and furosemide.  Hypotension overnight.  PRN hydralazine.    LOS: 10 Barbara [name] 2/26/202111:01 AM

## 2019-09-15 NOTE — Consult Note (Signed)
Pharmacy Antibiotic Note  Barbara [name] is a 79 y.o. female admitted on 09/05/2019 with aspiration pneumonia. Patient in hospital for left total hip arthroplasty and subsequently transferred to ICU on 4/69 for metabolic acidosis, encephalopathy, and severe respiratory distress requiring intubation. She was extubated 2/19  She has a history of ESRD on HD MWF. Pharmacy has been consulted for pip/tazo dosing for aspiration pneumonia..   Plan: Per [facility] Zosyn Extended Dosing Protocol , continue  Pip/Tazo 3.375 mg q12H due to HD renal dosing.    Height: 5' 6"  (167.6 cm) Weight: 184 lb 11.2 oz (83.8 kg) IBW/kg (Calculated) : 59.3  Temp (24hrs), Avg:99 F (37.2 C), Min:98.2 F (36.8 C), Max:99.7 F (37.6 C)  Recent Labs  Lab 09/11/19 0832 09/11/19 0832 09/12/19 1021 09/13/19 0737 09/13/19 0835 09/13/19 1217 09/14/19 0303 09/15/19 0433  WBC 14.0*   < > 13.9*  --  16.4* 17.1* 17.6* 19.7*  CREATININE 7.36*  --   --  7.33*  --  7.46* 5.56* 7.80*   < > = values in this interval not displayed.    Estimated Creatinine Clearance: 6.4 mL/min (A) (by C-G formula based on SCr of 7.8 mg/dL (H)).    Allergies  Allergen Reactions  . Glipizide Er Other (See Comments)    Hypoglycemia   . Percocet [Oxycodone-Acetaminophen] Hives    Antimicrobials this admission: Cefazolin for surgical prophylaxis Ampicillin/sulbactam 2/20 >> 2/24 Pip/tazo 2/24 >>  Microbiology results: 2/17 MRSA PCR: Positive  Thank you for allowing pharmacy to be a part of this patient's care.  Pernell Dupre, PharmD, BCPS Clinical Pharmacist 09/15/2019 9:05 AM

## 2019-09-15 NOTE — Progress Notes (Signed)
Subjective: 10 Days Post-Op Procedure(s) (LRB): TOTAL HIP ARTHROPLASTY (Left) NG Tube in place without issues. Patient to undergo dialysis today. No pain to the left hip. Patient is more alert and responsive this AM.  Objective: Vital signs in last 24 hours: Temp:  [98.6 F (37 C)-99.7 F (37.6 C)] 99.7 F (37.6 C) (02/26 0037) Pulse Rate:  [84-88] 88 (02/26 0037) Resp:  [17-18] 17 (02/26 0037) BP: (116-130)/(44-53) 127/50 (02/26 0037) SpO2:  [91 %-97 %] 97 % (02/26 0037) Weight:  [83.8 kg] 83.8 kg (02/26 0500)  Intake/Output from previous day:  Intake/Output Summary (Last 24 hours) at 09/15/2019 0746 Last data filed at 09/15/2019 0500 Gross per 24 hour  Intake 783.29 ml  Output --  Net 783.29 ml    Intake/Output this shift: No intake/output data recorded.  Labs: Recent Labs    09/12/19 1021 09/13/19 0835 09/13/19 1217 09/14/19 0303 09/15/19 0433  HGB 10.4* 10.1* 9.6* 9.1* 8.6*   Recent Labs    09/14/19 0303 09/15/19 0433  WBC 17.6* 19.7*  RBC 2.99* 2.80*  HCT 29.9* 28.4*  PLT 336 434*   Recent Labs    09/14/19 0303 09/15/19 0433  NA 142 142  K 3.8 3.8  CL 99 101  CO2 30 27  BUN 53* 81*  CREATININE 5.56* 7.80*  GLUCOSE 126* 126*  CALCIUM 8.5* 8.5*   No results for input(s): LABPT, INR in the last 72 hours.   EXAM General - Patient is awake and more alert this morning Dressing intact to the left hip, no drainage. Patient is moving toes and feet this AM. Negative Homan to the left leg.  Past Medical History:  Diagnosis Date  . Adult celiac disease   . Anemia associated with chronic renal failure 2017   blood transfusion last week 10/17  . Arthritis   . Asthma   . Cancer ([place]) 2017   bladder  . Chronic kidney disease   . 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  dr. Candiss Norse and dr. Holley Raring follow her  . COPD (chronic obstructive pulmonary disease) ([place])   . Coronary artery disease   . Diabetes mellitus without complication  ([place])   . Dialysis patient ([place])    Tues, Thurs, Sat  . Dyspnea    with exertion  . Elevated lipids   . GERD (gastroesophageal reflux disease)   . Hematuria   . Hypertension   . Lower back pain   . Neuropathy   . Oxygen dependent    at hs  . Personal history of chemotherapy 2017   bladder ca  . Personal history of radiation therapy 2017   bladder ca  . Sleep apnea    uses cpap  . Urinary obstruction 01/2016   Assessment/Plan: 10 Days Post-Op Procedure(s) (LRB): TOTAL HIP ARTHROPLASTY (Left) Principal Problem:   Status post total hip replacement, left Active Problems:   Anemia in chronic kidney disease   COPD (chronic obstructive pulmonary disease) (HCC)   CAD in native artery   Benign essential HTN   Acid reflux   Seizure (HCC)   Type II diabetes mellitus with renal manifestations (HCC)   ESRD on dialysis ([place])   Aspiration pneumonia ([place])   Acute encephalopathy   Goals of care, counseling/discussion  Estimated body mass index is 29.81 kg/m as calculated from the following:   Height as of this encounter: 5' 6"  (1.676 m).   Weight as of this encounter: 83.8 kg.  Dressing intact to the left hip.  NG Tube in place. Continue with therapy. No acute changes to the left hip. Continue to monitor progress. Will plan on staple removal for the patient on Monday or Tuesday of next week.  Raquel Shahmeer Bunn, PA-C [facility] Orthopaedic Surgery 09/15/2019, 7:46 AM

## 2019-09-16 LAB — CBC
HCT: 29.4 % — ABNORMAL LOW (ref 36.0–46.0)
Hemoglobin: 8.8 g/dL — ABNORMAL LOW (ref 12.0–15.0)
MCH: 30.7 pg (ref 26.0–34.0)
MCHC: 29.9 g/dL — ABNORMAL LOW (ref 30.0–36.0)
MCV: 102.4 fL — ABNORMAL HIGH (ref 80.0–100.0)
Platelets: 428 10*3/uL — ABNORMAL HIGH (ref 150–400)
RBC: 2.87 MIL/uL — ABNORMAL LOW (ref 3.87–5.11)
RDW: 14.7 % (ref 11.5–15.5)
WBC: 15.9 10*3/uL — ABNORMAL HIGH (ref 4.0–10.5)
nRBC: 0.1 % (ref 0.0–0.2)

## 2019-09-16 LAB — BASIC METABOLIC PANEL
Anion gap: 18 — ABNORMAL HIGH (ref 5–15)
BUN: 64 mg/dL — ABNORMAL HIGH (ref 8–23)
CO2: 27 mmol/L (ref 22–32)
Calcium: 8.8 mg/dL — ABNORMAL LOW (ref 8.9–10.3)
Chloride: 98 mmol/L (ref 98–111)
Creatinine, Ser: 6.3 mg/dL — ABNORMAL HIGH (ref 0.44–1.00)
GFR calc Af Amer: 7 mL/min — ABNORMAL LOW (ref >60)
GFR calc non Af Amer: 6 mL/min — ABNORMAL LOW (ref >60)
Glucose, Bld: 136 mg/dL — ABNORMAL HIGH (ref 70–99)
Potassium: 3.6 mmol/L (ref 3.5–5.1)
Sodium: 143 mmol/L (ref 135–145)

## 2019-09-16 LAB — GLUCOSE, CAPILLARY
Glucose-Capillary: 112 mg/dL — ABNORMAL HIGH (ref 70–99)
Glucose-Capillary: 128 mg/dL — ABNORMAL HIGH (ref 70–99)
Glucose-Capillary: 134 mg/dL — ABNORMAL HIGH (ref 70–99)
Glucose-Capillary: 136 mg/dL — ABNORMAL HIGH (ref 70–99)

## 2019-09-16 LAB — MAGNESIUM: Magnesium: 2.2 mg/dL (ref 1.7–2.4)

## 2019-09-16 LAB — PHOSPHORUS: Phosphorus: 4.3 mg/dL (ref 2.5–4.6)

## 2019-09-16 LAB — VITAMIN B1: Vitamin B1 (Thiamine): 108.9 nmol/L (ref 66.5–200.0)

## 2019-09-16 NOTE — Progress Notes (Signed)
Barbara [name]  MRN: [id number]  DOB/AGE: 02/26/1941 79 y.o.  Primary Care Physician:Johnston, Jenny Reichmann, MD  Admit date: 09/05/2019  Chief Complaint: No chief complaint on file.   S-Pt presented on  09/05/2019 with No chief complaint on file. .    Pt is confused unable to offer any complaints.  Patient husband was present in the room patient has been offered no specific complaints  Medications  . B-complex with vitamin C  1 tablet Per Tube QHS  . chlorhexidine  15 mL Mouth/Throat QID  . Chlorhexidine Gluconate Cloth  6 each Topical Q0600  . epoetin (EPOGEN/PROCRIT) injection  10,000 Units Intravenous Q M,W,F-HD  . feeding supplement (NEPRO CARB STEADY)  1,000 mL Oral Q24H  . feeding supplement (PRO-STAT SUGAR FREE 64)  30 mL Per Tube BID  . free water  50 mL Per Tube Q4H  . heparin injection (subcutaneous)  5,000 Units Subcutaneous Q12H  . mouth rinse  15 mL Mouth Rinse BID  . mupirocin ointment  1 application Nasal BID         ROS: Unable to get any data as patient is confused   Physical Exam: Vital signs in last 24 hours: Temp:  [98.3 F (36.8 C)-99.7 F (37.6 C)] 98.9 F (37.2 C) (02/27 0835) Pulse Rate:  [73-100] 80 (02/27 0835) Resp:  [16-33] 18 (02/27 0835) BP: (93-142)/(40-60) 130/56 (02/27 0835) SpO2:  [95 %-100 %] 97 % (02/27 0835) Weight:  [82.3 kg] 82.3 kg (02/27 0500) Weight change: -1.479 kg Last BM Date: 09/15/19  Intake/Output from previous day: 02/26 0701 - 02/27 0700 In: 952.3 [P.O.:30; NG/GT:790; IV Piggyback:132.3] Out: 1255  Total I/O In: 556 [NG/GT:556] Out: -    Physical Exam: General- pt is awake but confused  resp- No acute REsp distress, minimal rhonchi CVS- S1S2 regular in rate and rhythm GIT- BS+, soft, NT, ND EXT- NO LE Edema, Cyanosis Access patient has left upper extremity AV graft  Lab Results: CBC Recent Labs    09/15/19 0433 09/16/19 0505  WBC 19.7* 15.9*  HGB 8.6* 8.8*  HCT 28.4* 29.4*  PLT 434* 428*     BMET Recent Labs    09/15/19 0433 09/16/19 0505  NA 142 143  K 3.8 3.6  CL 101 98  CO2 27 27  GLUCOSE 126* 136*  BUN 81* 64*  CREATININE 7.80* 6.30*  CALCIUM 8.5* 8.8*    MICRO No results found for this or any previous visit (from the past 240 hour(s)).    Lab Results  Component Value Date   PTH 310 (H) 09/06/2019   CALCIUM 8.8 (L) 09/16/2019   CAION 0.85 (LL) 09/05/2019   PHOS 4.3 09/16/2019               Impression: 1)Renal ESRD Patient is on hemodialysis. Patient is on Monday Wednesday Friday schedule During earlier part of the hospital course patient was hypotensive and required vasopressors the patient was on CRRT. Patient is currently tolerating hemodialysis Patient was last dialyzed yesterday No need for hemodialysis today  2)HTN Blood pressure is at goal Patient is on vasodilators  3)Anemia of chronic disease  HGb is not at goal (9--11) but improving Patient is on Epogen  4) secondary hyperparathyroidism -CKD Mineral-Bone Disorder   Secondary Hyperparathyroidism  present.  Phosphorus at goal.   5) acute respiratory failure During the hospital stay patient developed acute respiratory failure was intubated. Patient is currently extubated   6) electrolytes   sodium Normonatremic   potassium Normokalemic  7)Acid base Co2 at goal   8) altered mental status Most likely secondary to metabolic encephalopathy Neurology and primary team is following  Plan:  No need for dialysis today We will continue the current treatment    Briane Birden [facility] 09/16/2019, 9:26 AM

## 2019-09-16 NOTE — Progress Notes (Signed)
PROGRESS NOTE    Barbara [name]  HLK:[id number] DOB: 1941/07/12 DOA: 09/05/2019  PCP: [name], MD    LOS - 11   Brief Narrative:  Medical records reviewed and are as summarized below:  Barbara L Conyersis an 79 y.o.femalewith medical history significant of hypertension, hyperlipidemia, diet-controlled diabetes, COPD, asthma, GERD, ESRD-HD (MWF), OSA, CAD, bladder cancer, iron deficiency anemia. She was admitted by Dr. Kizzie Bane for lefttotal hip arthroplasty. She developed acute respiratory distress after surgery. She was subsequently transferred to the ICU for acute hypoxemic and hypercapnic respiratory failure. Initially, she was treated with BiPAP. However, she failed BiPAP therapy and required intubation and mechanical ventilation in the ICU. She was also treated with IV antibiotics for aspiration pneumonia.  She was also seen by the nephrologist for end-stage renal disease. She was initially placed on CRRT and then transition to hemodialysis. She required vasopressors for hypotension.  She was seen by the neurologist for altered mental status and seizure activity. MRI brain did not show any acute stroke. Neurologist recommended Vimpat and monitoring for seizure  She was also seen by the cardiologist because of bradycardia and sinus pauses on telemetry. Per cardiologist, there was no indication for permanent pacemaker and he recommended a conservative approach.  She was extubated on 09/08/2019. Her condition was slowly improving so she was was transferred to the medical floor for continued medical management.  Subjective 2/27: Patient seen at bedside this AM.  She is a bit more interactive today, a bit irritable about oral care after any PO intake.  Nurse reports she will refuse offers of PO due to aggravation with oral care.  No acute events reported.  Family member at bedside.  Assessment & Plan:   Principal Problem:   Status post total hip replacement, left Active  Problems:   Anemia in chronic kidney disease   COPD (chronic obstructive pulmonary disease) (HCC)   CAD in native artery   Benign essential HTN   Acid reflux   Seizure (HCC)   Type II diabetes mellitus with renal manifestations (HCC)   ESRD on dialysis ([place])   Aspiration pneumonia ([place])   Acute encephalopathy   Goals of care, counseling/discussion   Dysphagia:  NG tube malposition.On 09/13/2019. X-ray abdomen showed no evidence of NG tube in place. X-ray chest was also not able to see the NG tube. NG tube was removed as it was found to be coiling up in oropharynx. Replacement of the NG tube was not successful by bedside RN. Now has Dobhoff in place with continuous tube feeds running.  Aspiration pneumonia:  Initially treated with IVcefazolin, later on transition to IV Unasyn. Cefazolin was discontinued due to persistent encephalopathy. The patient apparently has another episode of aspiration on morning of 09/13/2019 with fever, so was  transitioned back to Zosyn. Continue Zosyn until 2/30  Acute toxic metabolic encephalopathy: History of seizures Improving. Continue Vimpat.  Chart review shows that patient has had similar episodes of encephalopathyin the past and requiredseveral days to recover. Continue seizure precaution. Appreciate neurology assistance. EEG unremarkable. Avoidcephalosporin.  Acute hypoxemic and hypercapnic respiratory failure: Resolved.  Extubated on 09/08/2019.  She iscurrently on room air. Patient was briefly on NRB while she was at HD on 09/13/2019 but that is likely from coiling of the NG tube.  ESRD on hemodialysis:  Follow-up with nephrologist for hemodialysis. Per neurology uremia can also contribute to patient's encephalopathy. Schedule days are Mondays, Wednesdays and Fridays.  Chronic diastolic CHF:  Hemodialysis for fluid management.  2D  echo on 09/07/2019 showed EF estimated at 60 to 01%, grade 1 diastolic  dysfunction.  Dysphagia: Likely from deconditioning as well as encephalopathy. Currently requiring tube feeds. Dietary following. Speech therapy also consulted.  Leukocytosis and thrombocytopenia: Stable.  Bradycardia: Improved  Suspected ileus: Resolved  MRSA colonization of nares: Completed Bactroban  Overall poor prognosis.Palliative care following.   DVT prophylaxis: heparin   Code Status: DNR  Family Communication: female family member at bedside present during encounter, did not have specific questions   Disposition Plan:  D/C to SNFpending improvement in dysphagia or long-term nutrition needs addressed (PEG needed?).  Patient continues to require hospital care at this time due to inability to swallow safely, requiring tube feeding for nutrition.  Coming From Home Exp DC Date TBD Barriers dysphagia with recurrent aspiration, requiring tube feeds Medically Stable for Discharge? No  Consultants:   Nephrology  Pulmonology  Neurology  Palliative Care  TOC     Objective: Vitals:   09/15/19 1250 09/15/19 1623 09/16/19 0016 09/16/19 0500  BP: (!) 129/57 (!) 142/58 (!) 137/50   Pulse:  84 82   Resp:  16 16   Temp: 98.3 F (36.8 C) 98.6 F (37 C) 99.7 F (37.6 C)   TempSrc: Oral  Oral   SpO2: 100% 95% 96%   Weight:    82.3 kg  Height:        Intake/Output Summary (Last 24 hours) at 09/16/2019 0716 Last data filed at 09/16/2019 0300 Gross per 24 hour  Intake 952.34 ml  Output 1255 ml  Net -302.66 ml   Filed Weights   09/14/19 0500 09/15/19 0500 09/16/19 0500  Weight: 82.5 kg 83.8 kg 82.3 kg    Examination:  General exam: awake, alert, no acute distress, obese HEENT: Dobhoff in place with continuous tube feeds, moist mucus membranes, poor dentition Respiratory system: referred upper airway secretion sounds on exam limited by patient talking, rales or rhonchi, normal respiratory effort. Cardiovascular system: normal S1/S2, RRR, no pedal  edema.   Gastrointestinal system: soft, NT, ND, +bowel sounds. Central nervous system: A&O x4. no gross focal neurologic deficits, normal speech Extremities: moves all, normal tone Skin: dry, intact, normal temperature Psychiatry: normal mood, congruent affect, judgement and insight appear normal    Data Reviewed: I have personally reviewed following labs and imaging studies  CBC: Recent Labs  Lab 09/13/19 0835 09/13/19 1217 09/14/19 0303 09/15/19 0433 09/16/19 0505  WBC 16.4* 17.1* 17.6* 19.7* 15.9*  NEUTROABS  --   --  14.0*  --   --   HGB 10.1* 9.6* 9.1* 8.6* 8.8*  HCT 32.4* 30.9* 29.9* 28.4* 29.4*  MCV 100.3* 99.7 100.0 101.4* 102.4*  PLT 325 343 336 434* 779*   Basic Metabolic Panel: Recent Labs  Lab 09/11/19 0832 09/11/19 0832 09/12/19 0630 09/13/19 0737 09/13/19 1217 09/14/19 0303 09/15/19 0433 09/16/19 0505  NA 137   < >  --  140 141 142 142 143  K 4.0   < >  --  5.1 3.9 3.8 3.8 3.6  CL 101   < >  --  101 100 99 101 98  CO2 21*   < >  --  23 25 30 27 27   GLUCOSE 101*   < >  --  177* 144* 126* 126* 136*  BUN 89*   < >  --  88* 88* 53* 81* 64*  CREATININE 7.36*   < >  --  7.33* 7.46* 5.56* 7.80* 6.30*  CALCIUM 8.2*   < >  --  8.2* 8.2* 8.5* 8.5* 8.8*  MG  --   --  2.5*  --   --  2.1 2.5* 2.2  PHOS 4.4  --  1.9*  --  2.3*  --  4.0 4.3   < > = values in this interval not displayed.   GFR: Estimated Creatinine Clearance: 7.8 mL/min (A) (by C-G formula based on SCr of 6.3 mg/dL (H)). Liver Function Tests: Recent Labs  Lab 09/11/19 0832 09/13/19 0737 09/13/19 1217 09/14/19 0303  AST  --  50*  --  37  ALT  --  13  --  10  ALKPHOS  --  67  --  63  BILITOT  --  0.5  --  0.5  PROT  --  7.3  --  6.9  ALBUMIN 2.3* 2.2* 2.3* 2.1*   No results for input(s): LIPASE, AMYLASE in the last 168 hours. Recent Labs  Lab 09/12/19 1704  AMMONIA 24   Coagulation Profile: No results for input(s): INR, PROTIME in the last 168 hours. Cardiac Enzymes: No results  for input(s): CKTOTAL, CKMB, CKMBINDEX, TROPONINI in the last 168 hours. BNP (last 3 results) No results for input(s): PROBNP in the last 8760 hours. HbA1C: No results for input(s): HGBA1C in the last 72 hours. CBG: Recent Labs  Lab 09/15/19 0803 09/15/19 1623 09/15/19 2025 09/16/19 0018 09/16/19 0546  GLUCAP 130* 145* 123* 134* 128*   Lipid Profile: No results for input(s): CHOL, HDL, LDLCALC, TRIG, CHOLHDL, LDLDIRECT in the last 72 hours. Thyroid Function Tests: No results for input(s): TSH, T4TOTAL, FREET4, T3FREE, THYROIDAB in the last 72 hours. Anemia Panel: No results for input(s): VITAMINB12, FOLATE, FERRITIN, TIBC, IRON, RETICCTPCT in the last 72 hours. Sepsis Labs: Recent Labs  Lab 09/14/19 0303 09/15/19 0433  PROCALCITON 5.86 4.91    Recent Results (from the past 240 hour(s))  MRSA PCR Screening     Status: Abnormal   Collection Time: 09/06/19  7:50 AM   Specimen: Nasal Mucosa; Nasopharyngeal  Result Value Ref Range Status   MRSA by PCR POSITIVE (A) NEGATIVE Final    Comment:        The GeneXpert MRSA Assay (FDA approved for NASAL specimens only), is one component of a comprehensive MRSA colonization surveillance program. It is not intended to diagnose MRSA infection nor to guide or monitor treatment for MRSA infections. RESULT CALLED TO, READ BACK BY AND VERIFIED WITH: MEGAN SHEFFIELD AT 0931 ON 09/06/2019 Magnet. Performed at [facility], [address], [place], [place] [address]          Radiology Studies: DG [facility] W/Fl W/Rad  Result Date: 09/14/2019 CLINICAL DATA:  Dobbhoff tube needed. EXAM: NASO G TUBE PLACEMENT WITH FL AND WITH RAD CONTRAST:  None. FLUOROSCOPY TIME:  Fluoroscopy Time:  1 minutes 0 seconds Radiation Exposure Index (if provided by the fluoroscopic device): 14.1 mGy Number of Acquired Spot Images: 1 COMPARISON:  Portable chest and abdomen 09/13/2019 FINDINGS: Under fluoroscopic guidance a Dobbhoff tube was  placed with its tip placed to the level of the distal stomach. No complications. Tube was secured to the nose with tape and patient sent back to the floor in stable condition. IMPRESSION: Successful fluoroscopically directed Dobbhoff tube placement. Electronically Signed   By: Marcello Moores  Register   On: 09/14/2019 10:01        Scheduled Meds: . B-complex with vitamin C  1 tablet Per Tube QHS  . chlorhexidine  15 mL Mouth/Throat QID  . Chlorhexidine Gluconate  Cloth  6 each Topical Q0600  . epoetin (EPOGEN/PROCRIT) injection  10,000 Units Intravenous Q M,W,F-HD  . feeding supplement (NEPRO CARB STEADY)  1,000 mL Oral Q24H  . feeding supplement (PRO-STAT SUGAR FREE 64)  30 mL Per Tube BID  . free water  50 mL Per Tube Q4H  . heparin injection (subcutaneous)  5,000 Units Subcutaneous Q12H  . mouth rinse  15 mL Mouth Rinse BID  . mupirocin ointment  1 application Nasal BID   Continuous Infusions: . sodium chloride    . famotidine (PEPCID) IV 20 mg (09/15/19 2032)  . lacosamide (VIMPAT) IV Stopped (09/15/19 2338)  . piperacillin-tazobactam (ZOSYN)  IV 12.5 mL/hr at 09/16/19 0300     LOS: 11 days    Time spent: 30 minutes    Ezekiel Slocumb, DO [facility]   If 7PM-7AM, please contact night-coverage [email or web address] 09/16/2019, 7:16 AM

## 2019-09-16 NOTE — Progress Notes (Signed)
RN spoke to orthopedic MD regarding incision assessment. Incision appears to have some separation. Per MD, this is to be expected due to slow healing related to dialysis. WCTM.

## 2019-09-16 NOTE — Progress Notes (Signed)
Physical Therapy Treatment Patient Details Name: Barbara [name] MRN: [id number] DOB: [birth year] Today's Date: 09/16/2019    History of Present Illness Per MD:  "79 y.o. female with medical history significant of hypertension, hyperlipidemia, diet-controlled diabetes, COPD, asthma, GERD, ESRD-HD (MWF), OSA, CAD, bladder cancer, iron deficiency anemia.  She was admitted by Dr. Roland Rack for left total hip arthroplasty.  She developed acute respiratory distress after surgery.  She was subsequently transferred to the ICU for acute hypoxemic and hypercapnic respiratory failure.  Initially, she was treated with BiPAP.  However, she failed BiPAP therapy and required intubation and mechanical ventilation in the ICU.  She was also treated with IV antibiotics for aspiration pneumonia." Pt also recieved CRRT and transitioned to HD during this stay, as well as vasopressors for hypotension, MRI performed due to AMS and seizure like activity, MRI negative for acute findings. Cardiology consulted for bradycardia and sinus pauses, no indication for pacemaker, conservative approach. Pt extubated 09/08/2019 and transferred to medical floor.    PT Comments    Pt is making limited progress towards goals, limited by cognition/agitation and willingness to participate. Pt reports "I'll do it, just wait." but yet doesn't participate when asked. Attempted there-ex, however no movement noted and PROM facilitated. Husband attempts to motivate pt as well to no avail. Will continue to progress.    Follow Up Recommendations  SNF     Equipment Recommendations  Rolling walker with 5" wheels    Recommendations for Other Services OT consult     Precautions / Restrictions Precautions Precautions: Posterior Hip;Fall Precaution Booklet Issued: Yes (comment) Precaution Comments: NG tube Restrictions Weight Bearing Restrictions: Yes LLE Weight Bearing: Weight bearing as tolerated    Mobility  Bed Mobility                General bed mobility comments: unwilling to participate in OOB mobility. Attempted to position pt in midline as she appears with R lateral lean in bed, but pt cries out with attempts made for repositioning.  Transfers                 General transfer comment: unable  Ambulation/Gait                 Stairs             Wheelchair Mobility    Modified Rankin (Stroke Patients Only)       Balance                                            Cognition Arousal/Alertness: Awake/alert Behavior During Therapy: Agitated Overall Cognitive Status: Impaired/Different from baseline                                 General Comments: cusses at therapist multiple times and is difficult to educate on need for therapy. Husband in room providing encouragement for participation      Exercises Other Exercises Other Exercises: Supine ther-ex attempted, pt doesn't participate or follow commands despite saying "I can do this myself". Attempted B LE SLRS, and hip abd/add. Pt does attempt ~5 reps of ankle DF, however inconsistent    General Comments        Pertinent Vitals/Pain Pain Assessment: Faces Faces Pain Scale: Hurts whole lot Pain Location: with any movement, she cries out  in pain appears to be more localized on R side vs L side. Pain Descriptors / Indicators: Grimacing;Guarding;Moaning Pain Intervention(s): Limited activity within patient's tolerance;Repositioned    Home Living                      Prior Function            PT Goals (current goals can now be found in the care plan section) Acute Rehab PT Goals Patient Stated Goal: to get back to normal PT Goal Formulation: With family Time For Goal Achievement: 09/28/19 Potential to Achieve Goals: Fair Progress towards PT goals: Progressing toward goals    Frequency    7X/week      PT Plan Current plan remains appropriate    Co-evaluation               AM-PAC PT "6 Clicks" Mobility   Outcome Measure  Help needed turning from your back to your side while in a flat bed without using bedrails?: Total Help needed moving from lying on your back to sitting on the side of a flat bed without using bedrails?: Total Help needed moving to and from a bed to a chair (including a wheelchair)?: Total Help needed standing up from a chair using your arms (e.g., wheelchair or bedside chair)?: Total Help needed to walk in hospital room?: Total Help needed climbing 3-5 steps with a railing? : Total 6 Click Score: 6    End of Session Equipment Utilized During Treatment: Oxygen;Other (comment) Activity Tolerance: Treatment limited secondary to agitation Patient left: in bed;with call bell/phone within reach;with SCD's reapplied Nurse Communication: Mobility status PT Visit Diagnosis: Muscle weakness (generalized) (M62.81);Difficulty in walking, not elsewhere classified (R26.2);Other abnormalities of gait and mobility (R26.89);Pain Pain - Right/Left: Left Pain - part of body: Hip     Time: 9417-4081 PT Time Calculation (min) (ACUTE ONLY): 18 min  Charges:  $Therapeutic Exercise: 8-22 mins                     Greggory Stallion, PT, DPT [phone]    Kanen Mottola 09/16/2019, 1:43 PM

## 2019-09-16 NOTE — Progress Notes (Signed)
Subjective: 11 Days Post-Op Procedure(s) (LRB): TOTAL HIP ARTHROPLASTY (Left) NG Tube in place without issues. Patient to undergo dialysis today. No pain to the left hip. Patient is more alert and responsive this AM.  Objective: Vital signs in last 24 hours: Temp:  [98.2 F (36.8 C)-99.7 F (37.6 C)] 99.7 F (37.6 C) (02/27 0016) Pulse Rate:  [73-100] 82 (02/27 0016) Resp:  [16-33] 16 (02/27 0016) BP: (93-142)/(40-60) 137/50 (02/27 0016) SpO2:  [95 %-100 %] 96 % (02/27 0016) Weight:  [82.3 kg] 82.3 kg (02/27 0500)  Intake/Output from previous day:  Intake/Output Summary (Last 24 hours) at 09/16/2019 0355 Last data filed at 09/16/2019 0300 Gross per 24 hour  Intake 952.34 ml  Output 1255 ml  Net -302.66 ml    Intake/Output this shift: No intake/output data recorded.  Labs: Recent Labs    09/13/19 0835 09/13/19 1217 09/14/19 0303 09/15/19 0433 09/16/19 0505  HGB 10.1* 9.6* 9.1* 8.6* 8.8*   Recent Labs    09/15/19 0433 09/16/19 0505  WBC 19.7* 15.9*  RBC 2.80* 2.87*  HCT 28.4* 29.4*  PLT 434* 428*   Recent Labs    09/15/19 0433 09/16/19 0505  NA 142 143  K 3.8 3.6  CL 101 98  CO2 27 27  BUN 81* 64*  CREATININE 7.80* 6.30*  GLUCOSE 126* 136*  CALCIUM 8.5* 8.8*   No results for input(s): LABPT, INR in the last 72 hours.   EXAM General - Patient is awake and more alert this morning Dressing intact to the left hip, no drainage. Patient is moving toes and feet this AM. Negative [place] to the left leg.  Past Medical History:  Diagnosis Date  . Adult celiac disease   . Anemia associated with chronic renal failure 2017   blood transfusion last week 10/17  . Arthritis   . Asthma   . Cancer ([place]) 2017   bladder  . Chronic kidney disease   . 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  dr. Candiss Norse and dr. Holley Raring follow her  . COPD (chronic obstructive pulmonary disease) ([place])   . Coronary artery disease   . Diabetes mellitus without  complication ([place])   . Dialysis patient ([place])    Tues, Thurs, Sat  . Dyspnea    with exertion  . Elevated lipids   . GERD (gastroesophageal reflux disease)   . Hematuria   . Hypertension   . Lower back pain   . Neuropathy   . Oxygen dependent    at hs  . Personal history of chemotherapy 2017   bladder ca  . Personal history of radiation therapy 2017   bladder ca  . Sleep apnea    uses cpap  . Urinary obstruction 01/2016   Assessment/Plan: 11 Days Post-Op Procedure(s) (LRB): TOTAL HIP ARTHROPLASTY (Left) Principal Problem:   Status post total hip replacement, left Active Problems:   Anemia in chronic kidney disease   COPD (chronic obstructive pulmonary disease) (HCC)   CAD in native artery   Benign essential HTN   Acid reflux   Seizure (HCC)   Type II diabetes mellitus with renal manifestations (HCC)   ESRD on dialysis ([place])   Aspiration pneumonia ([place])   Acute encephalopathy   Goals of care, counseling/discussion  Estimated body mass index is 29.28 kg/m as calculated from the following:   Height as of this encounter: 5' 6"  (1.676 m).   Weight as of this encounter: 82.3 kg.  Dressing intact to the left  hip. NG Tube in place. Continue with therapy. No acute changes to the left hip. Continue to monitor progress. Will plan on staple removal for the patient on Monday or Tuesday of next week.  Reche Dixon, PA-C [facility] Orthopaedic Surgery 09/16/2019, 7:12 AM

## 2019-09-16 NOTE — Progress Notes (Signed)
SLP Cancellation Note  Patient Details Name: Barbara [name] MRN: [id number] DOB: 09/08/1940   Cancelled treatment:      Chart reviewed, Pt visited. Husband in the room. Pt refused to take any sips of the nectar thick liquid with ST. Became agitated with encouragement. Would give no reason except "bevcause I don't want to"   Encouraged husband to try with strict aspiration precautions and to make sure pt is fully upright. ST spoke with MD and Nsg re: above. Continue with nectar thick liquids and NGT for nutritional support. ST to follow.   Lucila Maine 09/16/2019, 11:18 AM

## 2019-09-17 ENCOUNTER — Encounter: Payer: Self-pay | Admitting: Surgery

## 2019-09-17 ENCOUNTER — Inpatient Hospital Stay: Payer: Medicare Other

## 2019-09-17 DIAGNOSIS — J690 Pneumonitis due to inhalation of food and vomit: Secondary | ICD-10-CM

## 2019-09-17 LAB — GLUCOSE, CAPILLARY
Glucose-Capillary: 104 mg/dL — ABNORMAL HIGH (ref 70–99)
Glucose-Capillary: 117 mg/dL — ABNORMAL HIGH (ref 70–99)
Glucose-Capillary: 119 mg/dL — ABNORMAL HIGH (ref 70–99)
Glucose-Capillary: 137 mg/dL — ABNORMAL HIGH (ref 70–99)
Glucose-Capillary: 95 mg/dL (ref 70–99)
Glucose-Capillary: 96 mg/dL (ref 70–99)

## 2019-09-17 LAB — CBC
HCT: 28.6 % — ABNORMAL LOW (ref 36.0–46.0)
Hemoglobin: 8.5 g/dL — ABNORMAL LOW (ref 12.0–15.0)
MCH: 30.5 pg (ref 26.0–34.0)
MCHC: 29.7 g/dL — ABNORMAL LOW (ref 30.0–36.0)
MCV: 102.5 fL — ABNORMAL HIGH (ref 80.0–100.0)
Platelets: 483 10*3/uL — ABNORMAL HIGH (ref 150–400)
RBC: 2.79 MIL/uL — ABNORMAL LOW (ref 3.87–5.11)
RDW: 14.8 % (ref 11.5–15.5)
WBC: 13.7 10*3/uL — ABNORMAL HIGH (ref 4.0–10.5)
nRBC: 0.2 % (ref 0.0–0.2)

## 2019-09-17 LAB — BASIC METABOLIC PANEL
Anion gap: 12 (ref 5–15)
BUN: 85 mg/dL — ABNORMAL HIGH (ref 8–23)
CO2: 28 mmol/L (ref 22–32)
Calcium: 8.6 mg/dL — ABNORMAL LOW (ref 8.9–10.3)
Chloride: 100 mmol/L (ref 98–111)
Creatinine, Ser: 8.5 mg/dL — ABNORMAL HIGH (ref 0.44–1.00)
GFR calc Af Amer: 5 mL/min — ABNORMAL LOW (ref >60)
GFR calc non Af Amer: 4 mL/min — ABNORMAL LOW (ref >60)
Glucose, Bld: 112 mg/dL — ABNORMAL HIGH (ref 70–99)
Potassium: 3.6 mmol/L (ref 3.5–5.1)
Sodium: 140 mmol/L (ref 135–145)

## 2019-09-17 LAB — PHOSPHORUS: Phosphorus: 5.8 mg/dL — ABNORMAL HIGH (ref 2.5–4.6)

## 2019-09-17 LAB — MAGNESIUM: Magnesium: 2.4 mg/dL (ref 1.7–2.4)

## 2019-09-17 NOTE — Progress Notes (Signed)
2nd chest x ray obtained to ensure placement, Sharion Settler NP at bedside to advance further. 3rd chest x ray order in to ensure placement.

## 2019-09-17 NOTE — Progress Notes (Addendum)
Patient pulled onto NG tube, has mittens on. Notified on call provider Sharion Settler NP, received verbal order for chest x ray to confirm placement. Feedings stopped.

## 2019-09-17 NOTE — NC FL2 (Signed)
[place] LEVEL OF CARE SCREENING TOOL     IDENTIFICATION  Patient Name: Barbara [name] Birthdate: 11/20/1940 Sex: female Admission Date (Current Location): 09/05/2019  [place] and [place] Number:  Barbara [name] [id number] [place] and Address:  [facility], [address] [place], [place] [address]      Provider Number: [id number]  Attending Physician Name and Address:  Jennye Boroughs, MD  Relative Name and Phone Number:  Saia Derossett [phone] / Daughter-Edna Kenton Kingfisher [phone]    Current Level of Care: Hospital Recommended Level of Care: [place] Prior Approval Number:    Date Approved/Denied:   PASRR Number: [id number]  Discharge Plan: SNF    Current Diagnoses: Patient Active Problem List   Diagnosis Date Noted  . Acute encephalopathy   . Goals of care, counseling/discussion   . Aspiration pneumonia ([place]) 09/11/2019  . Status post total hip replacement, left 09/05/2019  . Type II diabetes mellitus with renal manifestations ([place]) 09/05/2019  . ESRD on dialysis ([place]) 09/05/2019  . Bacteremia due to other bacteria   . Bacteroides fragilis infection   . Acute metabolic encephalopathy   . Somnolence   . Lobar pneumonia ([place]) 05/11/2019  . Dialysis patient ([place])   . Primary osteoarthritis of left hip 04/07/2019  . Metabolic acidosis 15/40/0867  . Complication from renal dialysis device 01/28/2018  . History of colonic polyps 07/30/2017  . Presence of cardiac and vascular implant and graft   . Fever   . Sepsis ([place])   . ESRD (end stage renal disease) ([place]) 02/01/2017  . Sepsis secondary to UTI ([place]) 01/24/2017  . Acute kidney injury ([place]) 11/19/2016  . Anemia, chronic renal failure, stage 4 (severe) ([place]) 09/17/2016  . Hypoglycemia 06/04/2016  . Hyperkalemia 05/12/2016  . Protein-calorie malnutrition, severe 05/05/2016  . Acute on chronic renal failure ([place]) 05/04/2016  . Seizure ([place]) 04/17/2016   . Palliative care by specialist   . DNR (do not resuscitate) discussion   . C. difficile diarrhea 03/11/2016  . Anemia 03/11/2016  . Hypotension 03/11/2016  . Acidosis 03/11/2016  . Failure to thrive (child) 03/11/2016  . Weakness generalized 03/11/2016  . ARF (acute renal failure) ([place]) 03/04/2016  . 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 Absolute anemia 02/04/2016  . Airway hyperreactivity 02/04/2016  . Celiac disease 02/04/2016  . Gastric catarrh 02/04/2016  . Acid reflux 02/04/2016  . Combined fat and carbohydrate induced hyperlipemia 02/04/2016  . C. difficile colitis 01/22/2016  . Urinary obstruction 01/21/2016  . COPD (chronic obstructive pulmonary disease) ([place]) 01/21/2016  . Controlled type 2 diabetes mellitus with stage 4 chronic kidney disease, with long-term current use of insulin ([place]) 01/21/2016  . Essential hypertension 01/21/2016  . Acute renal failure superimposed on stage 4 chronic kidney disease ([place]) 01/20/2016  . Anemia in chronic kidney disease 01/20/2016  . Arthritis of knee, degenerative 05/10/2015  . Knee strain 03/26/2015  . Other intervertebral disc displacement, lumbar region 03/04/2015  . Degenerative arthritis of lumbar spine 03/04/2015  . HNP (herniated nucleus pulposus), lumbar 03/04/2015  . Osteoarthritis of spine with radiculopathy, lumbar region 03/04/2015  . Injury of tendon of upper extremity 11/12/2014  . Atherosclerosis of abdominal aorta ([place]) 11/02/2014  . Chronic kidney disease, stage IV (severe) ([place]) 11/02/2014  . Obstructive apnea 11/02/2014  . Complete rotator cuff rupture of left shoulder 10/05/2014  . Arthritis of shoulder region, degenerative 10/05/2014  . CAD in native artery 12/08/2013  . Benign essential HTN 12/08/2013  .  Type 2 diabetes mellitus ([place]) 12/08/2013    Orientation RESPIRATION BLADDER Height & Weight     Self  O2(2L [place]) Incontinent Weight: 177 lb 7.5 oz (80.5 kg) Height:  5' 6"  (167.6 cm)   BEHAVIORAL SYMPTOMS/MOOD NEUROLOGICAL BOWEL NUTRITION STATUS      Incontinent Diet(NPO)  AMBULATORY STATUS COMMUNICATION OF NEEDS Skin   Extensive Assist Verbally Surgical wounds(Left Hip Surgery)                       Personal Care Assistance Level of Assistance  Bathing, Feeding, Dressing Bathing Assistance: Maximum assistance Feeding assistance: Limited assistance Dressing Assistance: Maximum assistance     Functional Limitations Info  Sight, Hearing, Speech Sight Info: Adequate Hearing Info: Adequate Speech Info: Impaired(slurred/dysarthria)    SPECIAL CARE FACTORS FREQUENCY  PT (By licensed PT), OT (By licensed OT)     PT Frequency: 5x OT Frequency: 5x     Speech Therapy Frequency: 3-5x week      Contractures Contractures Info: Not present    Additional Factors Info  Code Status, Allergies Code Status Info: DNR Allergies Info: Glipizide ER, Percocet (Oxycodone acetaminophin)           Current Medications (09/17/2019):  This is the current hospital active medication list Current Facility-Administered Medications  Medication Dose Route Frequency Provider Last Rate Last Admin  . 0.9 %  sodium chloride infusion  250 mL Intravenous Continuous Flora Lipps, MD      . acetaminophen (TYLENOL) 160 MG/5ML solution 325 mg  325 mg Oral Q6H PRN Lavina Hamman, MD   325 mg at 09/15/19 0555  . acetaminophen (TYLENOL) suppository 650 mg  650 mg Rectal Q4H PRN Lavina Hamman, MD   650 mg at 09/14/19 0025  . acetaminophen (TYLENOL) tablet 325-650 mg  325-650 mg Oral Q6H PRN Corky Mull, MD   650 mg at 09/07/19 0908  . B-complex with vitamin C tablet 1 tablet  1 tablet Per Tube QHS Lavina Hamman, MD   1 tablet at 09/16/19 2257  . bisacodyl (DULCOLAX) suppository 10 mg  10 mg Rectal Daily PRN Poggi, Marshall Cork, MD      . chlorhexidine (PERIDEX) 0.12 % solution 15 mL  15 mL Mouth/Throat QID Lavina Hamman, MD   15 mL at 09/17/19 1319  . epoetin alfa (EPOGEN) injection  10,000 Units  10,000 Units Intravenous Q M,W,F-HD Lavonia Dana, MD   10,000 Units at 09/15/19 1205  . famotidine (PEPCID) IVPB 20 mg premix  20 mg Intravenous Q24H Lavina Hamman, MD 100 mL/hr at 09/16/19 1824 20 mg at 09/16/19 1824  . feeding supplement (NEPRO CARB STEADY) liquid 1,000 mL  1,000 mL Oral Q24H Lavina Hamman, MD 40 mL/hr at 09/16/19 1825 Rate Verify at 09/16/19 1825  . feeding supplement (PRO-STAT SUGAR FREE 64) liquid 30 mL  30 mL Per Tube BID Lavina Hamman, MD   30 mL at 09/17/19 0850  . free water 50 mL  50 mL Per Tube Q4H Lavina Hamman, MD   50 mL at 09/16/19 2000  . heparin injection 5,000 Units  5,000 Units Subcutaneous Q12H Tyler Pita, MD   5,000 Units at 09/16/19 2254  . hydrALAZINE (APRESOLINE) tablet 25 mg  25 mg Oral TID PRN Ivor Costa, MD      . ipratropium-albuterol (DUONEB) 0.5-2.5 (3) MG/3ML nebulizer solution 3 mL  3 mL Nebulization Q4H PRN Darel Hong D, NP   3 mL at 09/08/19  2020  . lacosamide (VIMPAT) 100 mg in sodium chloride 0.9 % 25 mL IVPB  100 mg Intravenous Q12H Alexis Goodell, MD 70 mL/hr at 09/17/19 0845 100 mg at 09/17/19 0845  . MEDLINE mouth rinse  15 mL Mouth Rinse BID Tyler Pita, MD   15 mL at 09/17/19 0851  . ondansetron (ZOFRAN) tablet 4 mg  4 mg Oral Q6H PRN Poggi, Marshall Cork, MD       Or  . ondansetron [facility]) injection 4 mg  4 mg Intravenous Q6H PRN Poggi, Marshall Cork, MD      . piperacillin-tazobactam (ZOSYN) IVPB 3.375 g  3.375 g Intravenous Q12H Hallaji, Sheema M, RPH 12.5 mL/hr at 09/17/19 1324 3.375 g at 09/17/19 1324     Discharge Medications: Please see discharge summary for a list of discharge medications.  Relevant Imaging Results:  Relevant Lab Results:   Additional Information SSN: [id number] HD- MWF  Truitt Merle, LCSW

## 2019-09-17 NOTE — Progress Notes (Signed)
Barbara [name]  MRN: [id number]  DOB/AGE: [birth year] 79 y.o.  Primary Care Physician:Johnston, Jenny Reichmann, MD  Admit date: 09/05/2019  Chief Complaint: No chief complaint on file.   S-Pt presented on  09/05/2019 with No chief complaint on file. .    Pt is confused unable to offer any complaints.     Medications  . B-complex with vitamin C  1 tablet Per Tube QHS  . chlorhexidine  15 mL Mouth/Throat QID  . epoetin (EPOGEN/PROCRIT) injection  10,000 Units Intravenous Q M,W,F-HD  . feeding supplement (NEPRO CARB STEADY)  1,000 mL Oral Q24H  . feeding supplement (PRO-STAT SUGAR FREE 64)  30 mL Per Tube BID  . free water  50 mL Per Tube Q4H  . heparin injection (subcutaneous)  5,000 Units Subcutaneous Q12H  . mouth rinse  15 mL Mouth Rinse BID         ROS: Unable to get any data as patient is confused   Physical Exam: Vital signs in last 24 hours: Temp:  [98.2 F (36.8 C)-98.9 F (37.2 C)] 98.2 F (36.8 C) (02/28 0756) Pulse Rate:  [72-78] 75 (02/28 0756) Resp:  [16-18] 17 (02/28 0756) BP: (133-146)/(53-65) 138/65 (02/28 0756) SpO2:  [97 %-100 %] 97 % (02/28 0756) Weight:  [80.5 kg] 80.5 kg (02/28 0550) Weight change: -1.8 kg Last BM Date: 09/17/19  Intake/Output from previous day: 02/27 0701 - 02/28 0700 In: 971 [NG/GT:971] Out: -  No intake/output data recorded.   Physical Exam: General- pt is awake but confused  resp- No acute REsp distress, minimal rhonchi CVS- S1S2 regular in rate and rhythm GIT- BS+, soft, NT, ND EXT- NO LE Edema, Cyanosis Access patient has left upper extremity AV graft  Lab Results: CBC Recent Labs    09/16/19 0505 09/17/19 0452  WBC 15.9* 13.7*  HGB 8.8* 8.5*  HCT 29.4* 28.6*  PLT 428* 483*    BMET Recent Labs    09/16/19 0505 09/17/19 0452  NA 143 140  K 3.6 3.6  CL 98 100  CO2 27 28  GLUCOSE 136* 112*  BUN 64* 85*  CREATININE 6.30* 8.50*  CALCIUM 8.8* 8.6*    MICRO No results found for this or any previous visit  (from the past 240 hour(s)).    Lab Results  Component Value Date   PTH 310 (H) 09/06/2019   CALCIUM 8.6 (L) 09/17/2019   CAION 0.85 (LL) 09/05/2019   PHOS 5.8 (H) 09/17/2019               Impression: 1)Renal ESRD Patient is on hemodialysis. Patient is on Monday Wednesday Friday schedule During earlier part of the hospital course patient was hypotensive and required vasopressors the patient was on CRRT. Patient is currently tolerating hemodialysis Patient was last dialyzed on Friday  No need for hemodialysis today  2)HTN Blood pressure is at goal Patient is on vasodilators  3)Anemia of chronic disease  HGb is not at goal (9--11) but improving Patient is on Epogen  4) secondary hyperparathyroidism -CKD Mineral-Bone Disorder   Secondary Hyperparathyroidism  present.  Phosphorus at goal.   5) acute respiratory failure During the hospital stay patient developed acute respiratory failure was intubated. Patient is currently extubated   6) electrolytes   sodium Normonatremic   potassium Normokalemic    7)Acid base Co2 at goal   8) altered mental status Most likely secondary to metabolic encephalopathy Neurology and primary team is following  Plan:  Will dialyze in am  We will continue  the current treatment    Barbara [name] s Theador Hawthorne 09/17/2019, 9:27 AM

## 2019-09-17 NOTE — Progress Notes (Signed)
Physical Therapy Treatment Patient Details Name: Barbara [name] MRN: [id number] DOB: 12-Mar-1941 Today's Date: 09/17/2019    History of Present Illness Per MD:  "79 y.o. female with medical history significant of hypertension, hyperlipidemia, diet-controlled diabetes, COPD, asthma, GERD, ESRD-HD (MWF), OSA, CAD, bladder cancer, iron deficiency anemia.  She was admitted by Dr. Roland [name] for left total hip arthroplasty.  She developed acute respiratory distress after surgery.  She was subsequently transferred to the ICU for acute hypoxemic and hypercapnic respiratory failure.  Initially, she was treated with BiPAP.  However, she failed BiPAP therapy and required intubation and mechanical ventilation in the ICU.  She was also treated with IV antibiotics for aspiration pneumonia." Pt also recieved CRRT and transitioned to HD during this stay, as well as vasopressors for hypotension, MRI performed due to AMS and seizure like activity, MRI negative for acute findings. Cardiology consulted for bradycardia and sinus pauses, no indication for pacemaker, conservative approach. Pt extubated 09/08/2019 and transferred to medical floor.    PT Comments    Pt is making no progress towards goals with little active participation with therapy. PROM performed to B LE with complaints of R side LE pain > L LE pain.  Attempted positioning for heel floating. Maintains R side lateral trunk lean with head turned to R. Pt will turn head to midline when asked to look L, however unable to fully rotate. Husband in room and tries to get pt to participate to no avail. Pt pulled out NG tube and IV, not replaced and pending surgical consult for PEG tube.  Mits donned. Due to lack of progress, downgrading to 2-6x/week. Will continue to progress as able.   Follow Up Recommendations  SNF     Equipment Recommendations  Rolling walker with 5" wheels    Recommendations for Other Services       Precautions / Restrictions  Precautions Precautions: Posterior Hip;Fall Precaution Booklet Issued: Yes (comment) Precaution Comments: pt pulled out NG tube and IV, currently has mits on Restrictions Weight Bearing Restrictions: Yes LLE Weight Bearing: Weight bearing as tolerated    Mobility  Bed Mobility               General bed mobility comments: unwilling to participate in repositioning. Attempted rolling, however needs total assist with no active participation, drops hand when assist to reach for handrail  Transfers                 General transfer comment: unable  Ambulation/Gait                 Stairs             Wheelchair Mobility    Modified Rankin (Stroke Patients Only)       Balance                                            Cognition Arousal/Alertness: Lethargic Behavior During Therapy: Flat affect Overall Cognitive Status: Impaired/Different from baseline                                 General Comments: improved affect this date, however more flat and very lethargic. Mumbles frequently      Exercises Other Exercises Other Exercises: Supine ther-ex attempted, pt doesn't participate or follow commands despite saying "I can  do this myself". Attempted B LE SLRS, SAQ , and hip abd/add. Pt demonstrate no active attempt for participation with DF.     General Comments        Pertinent Vitals/Pain Pain Assessment: Faces Faces Pain Scale: Hurts whole lot Pain Location: with any movement, she cries out in pain appears to be more localized on R side vs L side. Pain Descriptors / Indicators: Grimacing;Guarding;Moaning Pain Intervention(s): Limited activity within patient's tolerance    Home Living                      Prior Function            PT Goals (current goals can now be found in the care plan section) Acute Rehab PT Goals Patient Stated Goal: unable to state PT Goal Formulation: With family Time For  Goal Achievement: 09/28/19 Potential to Achieve Goals: Fair Progress towards PT goals: Progressing toward goals    Frequency    Min 2X/week      PT Plan Frequency needs to be updated    Co-evaluation              AM-PAC PT "6 Clicks" Mobility   Outcome Measure  Help needed turning from your back to your side while in a flat bed without using bedrails?: Total Help needed moving from lying on your back to sitting on the side of a flat bed without using bedrails?: Total Help needed moving to and from a bed to a chair (including a wheelchair)?: Total Help needed standing up from a chair using your arms (e.g., wheelchair or bedside chair)?: Total Help needed to walk in hospital room?: Total Help needed climbing 3-5 steps with a railing? : Total 6 Click Score: 6    End of Session Equipment Utilized During Treatment: Oxygen;Other (comment) Activity Tolerance: Patient limited by lethargy Patient left: in bed;with bed alarm set;with family/visitor present Nurse Communication: Mobility status PT Visit Diagnosis: Muscle weakness (generalized) (M62.81);Difficulty in walking, not elsewhere classified (R26.2);Other abnormalities of gait and mobility (R26.89);Pain Pain - Right/Left: Left Pain - part of body: Hip     Time: 2162-4469 PT Time Calculation (min) (ACUTE ONLY): 16 min  Charges:  $Therapeutic Exercise: 8-22 mins                     Barbara [name], PT, DPT [phone]    Barbara [name] 09/17/2019, 3:03 PM

## 2019-09-17 NOTE — Progress Notes (Addendum)
At bedside with on call provider Sharion Settler NP who advanced NG tube. 2nd chest x ray order received to confirm placement.

## 2019-09-17 NOTE — TOC Progression Note (Signed)
Transition of Care [facility]) - Progression Note    Patient Details  Name: Barbara [name] MRN: [id number] Date of Birth: 19-Mar-1941  [facility] CM/SW Contact  Truitt Merle, [place] Phone Number: 09/17/2019, 3:47 PM  Clinical Narrative:    Attempted to contact daughter-Edna Harris-no answer and VM not setup and spouse-no answer. TOC continuing to follow for discharge planning.    Expected Discharge Plan: [place] Barriers to Discharge: Continued Medical Work up  Expected Discharge Plan and Services Expected Discharge Plan: [place] In-house Referral: Clinical Social Work     Living arrangements for the past 2 months: Single Family Home                                       Social Determinants of Health (SDOH) Interventions    Readmission Risk Interventions No flowsheet data found.

## 2019-09-17 NOTE — Progress Notes (Signed)
OVERNIGHT EVENT  Early evening nurse report DHT being partially out, Xray confirmed DHT terminating in the distal esophagus. ttempted to advance tube twice at bedside but unable to pass through GE sphincter and tube became coiled within the espphagus, therefore the tube was completely removed and will need replacement in IR.  Continue every 4 hour CBG. Will start dextrose source IV if needed

## 2019-09-17 NOTE — Plan of Care (Signed)

## 2019-09-17 NOTE — Progress Notes (Addendum)
Patient pulled Dobhoff NG tube, mittens applied, stopped feedings. Notified on call provider Sharion Settler NP, Dobhoff was placed by IR, received verbal order for stat chest x ray to verify placement of NG tube. Patient not in acute distress.

## 2019-09-17 NOTE — Progress Notes (Addendum)
Progress Note    Barbara [name]  XJO:[id number] DOB: 1940-09-29  DOA: 09/05/2019 PCP: [name], MD      Brief Narrative:    Medical records reviewed and are as summarized below:  Barbara [name] is an 79 y.o. female medical history significant of hypertension, hyperlipidemia, diet-controlled diabetes, COPD, asthma, GERD, ESRD-HD (MWF), OSA, CAD, bladder cancer, iron deficiency anemia. She was admitted by Dr. Kizzie Bane for lefttotal hip arthroplasty. She developed acute respiratory distress after surgery. She was subsequently transferred to the ICU for acute hypoxemic and hypercapnic respiratory failure. Initially, she was treated with BiPAP. However, she failed BiPAP therapy and required intubation and mechanical ventilation in the ICU. She was also treated with IV antibiotics for aspiration pneumonia.  She was also seen by the nephrologist for end-stage renal disease. She was initially placed on CRRT and then transition to hemodialysis. She required vasopressors for hypotension.  She was seen by the neurologist for altered mental status and seizure activity. MRI brain did not show any acute stroke. Neurologist recommended Vimpat and monitoring for seizure  She was also seen by the cardiologist because of bradycardia and sinus pauses on telemetry. Per cardiologist, there was no indication for permanent pacemaker and he recommended a conservative approach.  She was extubated on 09/08/2019. Her condition was slowly improving so she was was transferred to the medical floor for continued medical management.      Assessment/Plan:   Principal Problem:   Status post total hip replacement, left Active Problems:   Anemia in chronic kidney disease   COPD (chronic obstructive pulmonary disease) (HCC)   CAD in native artery   Benign essential HTN   Acid reflux   Seizure (HCC)   Type II diabetes mellitus with renal manifestations (HCC)   ESRD on dialysis ([place])    Aspiration pneumonia ([place])   Acute encephalopathy   Goals of care, counseling/discussion   Dysphagia: Patient removed her NG tube again and nursing staff could not replace it.  Discussed long-term feeding tube/PEG tube with her daughter and her husband.  Her daughter wanted the patient's husband to make the decision.  Her husband said he had thought about that and he would like for her to have a PEG tube.  Consulted gastroenterologist to evaluate the patient for possible PEG tube placement.  Aspiration pneumonia:  Initially treated with IVcefazolin, later on transition to IV Unasyn. Cefazolin was discontinued due to persistent encephalopathy. The patient apparently has another episode of aspiration on morning of 09/13/2019 with fever, so was  transitioned back to Zosyn. Continue Zosyn until 2/30  Acute toxic metabolic encephalopathy: History of seizures Improving. Continue Vimpat.  Chart review shows that patient has had similar episodes of encephalopathyin the past and requiredseveral days to recover. Continue seizure precaution. Appreciate neurology assistance. EEG unremarkable. Avoidcephalosporin.  Acute hypoxemic and hypercapnic respiratory failure: Resolved.  Extubated on 09/08/2019.  She iscurrently on room air. Patient was briefly on NRB while she was at HD on 09/13/2019 but that is likely from coiling of the NG tube.  ESRD on hemodialysis:  Follow-up with nephrologist for hemodialysis. Per neurology uremia can also contribute to patient's encephalopathy. Schedule days are Mondays, Wednesdays and Fridays.  Chronic diastolic CHF:  Hemodialysis for fluid management.  2D echo on 09/07/2019 showed EF estimated at 60 to 41%, grade 1 diastolic dysfunction.  Leukocytosis and thrombocytopenia: Stable.  Bradycardia: Improved  Suspected ileus: Resolved  MRSA colonization of nares: Completed Bactroban  Overall poor prognosis.Palliative care  following.   Body mass index is 28.64 kg/m.   Family Communication/Anticipated D/C date and plan/Code Status   DVT prophylaxis: Heparin Code Status: DNR Family Communication: Plan discussed with her husband and daughter. disposition Plan: Plan is to discharge to skilled nursing facility.  Patient will be discharged home on specific plan for dysphagia and nutrition has been addressed.      Subjective:   Patient is confused and unable to provide an adequate history.  Overnight events noted.  Patient removed her NG tube and this could not be replaced by nursing staff.  Objective:    Vitals:   09/16/19 2345 09/17/19 0429 09/17/19 0550 09/17/19 0756  BP: (!) 146/60 (!) 133/53  138/65  Pulse: 78 74  75  Resp: 18 16  17   Temp: 98.3 F (36.8 C)   98.2 F (36.8 C)  TempSrc: Oral   Oral  SpO2: 99% 98%  97%  Weight:   80.5 kg   Height:        Intake/Output Summary (Last 24 hours) at 09/17/2019 1217 Last data filed at 09/17/2019 0947 Gross per 24 hour  Intake 240 ml  Output --  Net 240 ml   Filed Weights   09/15/19 0500 09/16/19 0500 09/17/19 0550  Weight: 83.8 kg 82.3 kg 80.5 kg    Exam:  GEN: NAD SKIN: No rash EYES: EOMI ENT: MMM CV: RRR PULM: CTA B ABD: soft, ND, NT, +BS CNS: AAO x 1 (person), confused, non focal EXT: No edema or tenderness   Data Reviewed:   I have personally reviewed following labs and imaging studies:  Labs: Labs show the following:   Basic Metabolic Panel: Recent Labs  Lab 09/12/19 0630 09/13/19 0737 09/13/19 1217 09/13/19 1217 09/14/19 0303 09/14/19 0303 09/15/19 0433 09/15/19 0433 09/16/19 0505 09/17/19 0452  NA  --    < > 141  --  142  --  142  --  143 140  K  --    < > 3.9   < > 3.8   < > 3.8   < > 3.6 3.6  CL  --    < > 100  --  99  --  101  --  98 100  CO2  --    < > 25  --  30  --  27  --  27 28  GLUCOSE  --    < > 144*  --  126*  --  126*  --  136* 112*  BUN  --    < > 88*  --  53*  --  81*  --  64* 85*   CREATININE  --    < > 7.46*  --  5.56*  --  7.80*  --  6.30* 8.50*  CALCIUM  --    < > 8.2*  --  8.5*  --  8.5*  --  8.8* 8.6*  MG 2.5*  --   --   --  2.1  --  2.5*  --  2.2 2.4  PHOS 1.9*  --  2.3*  --   --   --  4.0  --  4.3 5.8*   < > = values in this interval not displayed.   GFR Estimated Creatinine Clearance: 5.7 mL/min (A) (by C-G formula based on SCr of 8.5 mg/dL (H)). Liver Function Tests: Recent Labs  Lab 09/11/19 0832 09/13/19 0737 09/13/19 1217 09/14/19 0303  AST  --  50*  --  37  ALT  --  13  --  10  ALKPHOS  --  67  --  63  BILITOT  --  0.5  --  0.5  PROT  --  7.3  --  6.9  ALBUMIN 2.3* 2.2* 2.3* 2.1*   No results for input(s): LIPASE, AMYLASE in the last 168 hours. Recent Labs  Lab 09/12/19 1704  AMMONIA 24   Coagulation profile No results for input(s): INR, PROTIME in the last 168 hours.  CBC: Recent Labs  Lab 09/13/19 1217 09/14/19 0303 09/15/19 0433 09/16/19 0505 09/17/19 0452  WBC 17.1* 17.6* 19.7* 15.9* 13.7*  NEUTROABS  --  14.0*  --   --   --   HGB 9.6* 9.1* 8.6* 8.8* 8.5*  HCT 30.9* 29.9* 28.4* 29.4* 28.6*  MCV 99.7 100.0 101.4* 102.4* 102.5*  PLT 343 336 434* 428* 483*   Cardiac Enzymes: No results for input(s): CKTOTAL, CKMB, CKMBINDEX, TROPONINI in the last 168 hours. BNP (last 3 results) No results for input(s): PROBNP in the last 8760 hours. CBG: Recent Labs  Lab 09/16/19 2009 09/16/19 2346 09/17/19 0325 09/17/19 0758 09/17/19 1159  GLUCAP 136* 112* 119* 117* 137*   D-Dimer: No results for input(s): DDIMER in the last 72 hours. Hgb A1c: No results for input(s): HGBA1C in the last 72 hours. Lipid Profile: No results for input(s): CHOL, HDL, LDLCALC, TRIG, CHOLHDL, LDLDIRECT in the last 72 hours. Thyroid function studies: No results for input(s): TSH, T4TOTAL, T3FREE, THYROIDAB in the last 72 hours.  Invalid input(s): FREET3 Anemia work up: No results for input(s): VITAMINB12, FOLATE, FERRITIN, TIBC, IRON, RETICCTPCT  in the last 72 hours. Sepsis Labs: Recent Labs  Lab 09/14/19 0303 09/15/19 0433 09/16/19 0505 09/17/19 0452  PROCALCITON 5.86 4.91  --   --   WBC 17.6* 19.7* 15.9* 13.7*    Microbiology No results found for this or any previous visit (from the past 240 hour(s)).  Procedures and diagnostic studies:  DG Chest 1 View  Result Date: 09/17/2019 CLINICAL DATA:  Feeding tube placement EXAM: CHEST  1 VIEW COMPARISON:  09/17/2019 FINDINGS: 0402 hours. The cardio pericardial silhouette is enlarged. There is pulmonary vascular congestion without overt pulmonary edema. Interstitial markings are diffusely coarsened with chronic features. Prominent skin fold noted over the left hemithorax. Feeding tube is coiled in the esophagus with the tip of the feeding tube at the esophagogastric junction. Telemetry leads overlie the chest. IMPRESSION: A relatively large length of the feeding tube is coiled in the esophagus with the tip at the esophagogastric junction. Given the large amount of redundant tubing in the esophagus, this should be pulled back with repeat placement. These results will be called to the ordering clinician or representative by the Radiologist Assistant, and communication documented in the PACS or zVision Dashboard. Electronically Signed   By: Misty Stanley M.D.   On: 09/17/2019 04:26   DG Chest 1 View  Result Date: 09/17/2019 CLINICAL DATA:  Enteric catheter placement, patient pulled on NG tube EXAM: CHEST  1 VIEW COMPARISON:  09/17/2019 FINDINGS: Single frontal view of the chest demonstrates a weighted tip of an enteric feeding catheter overlying the gastroesophageal junction. Cardiac silhouette is stable. Chronic central vascular congestion and diffuse interstitial prominence unchanged. Stable patchy consolidation at the lung bases. No effusion or pneumothorax. Vascular stent unchanged. IMPRESSION: 1. Enteric catheter overlying gastroesophageal junction. 2. Stable vascular congestion and  bibasilar consolidation. Electronically Signed   By: Randa Ngo M.D.   On: 09/17/2019 03:09   DG Chest 1 View  Result Date: 09/17/2019 CLINICAL DATA:  Enteric catheter placement, previous abnormal chest x-ray EXAM: CHEST  1 VIEW COMPARISON:  09/13/2019 FINDINGS: Frontal view of the chest was obtained, with the patient markedly rotated to the right. Weighted tip of an enteric catheter overlies the distal thoracic esophagus. The tip of the catheter is at least 4 cm from the gastroesophageal junction. The cardiac silhouette is enlarged but stable. There is persistent central vascular congestion. Patchy consolidation at the lung bases slightly improved. Evaluation of the right lung base limited due to patient rotation. No large effusion or pneumothorax. Stable left-sided vascular stent. IMPRESSION: 1. Enteric catheter as above. Recommended advancing at least 4 cm to ensure intragastric placement. 2. Central vascular congestion and patchy bibasilar consolidation, slightly improved since prior study. Evaluation limited by positioning. Electronically Signed   By: Randa Ngo M.D.   On: 09/17/2019 00:30    Medications:   . B-complex with vitamin C  1 tablet Per Tube QHS  . chlorhexidine  15 mL Mouth/Throat QID  . epoetin (EPOGEN/PROCRIT) injection  10,000 Units Intravenous Q M,W,F-HD  . feeding supplement (NEPRO CARB STEADY)  1,000 mL Oral Q24H  . feeding supplement (PRO-STAT SUGAR FREE 64)  30 mL Per Tube BID  . free water  50 mL Per Tube Q4H  . heparin injection (subcutaneous)  5,000 Units Subcutaneous Q12H  . mouth rinse  15 mL Mouth Rinse BID   Continuous Infusions: . sodium chloride    . famotidine (PEPCID) IV 20 mg (09/16/19 1824)  . lacosamide (VIMPAT) IV 100 mg (09/17/19 0845)  . piperacillin-tazobactam (ZOSYN)  IV 3.375 g (09/17/19 0247)     LOS: 12 days   Jenaye Rickert  [facility]     09/17/2019, 12:17 PM

## 2019-09-17 NOTE — Progress Notes (Signed)
Chest x ray confirmed NG tubing coiled. On call provider Sharion Settler NP removed Dobhoff tube.

## 2019-09-17 NOTE — Progress Notes (Signed)
Patient has mittens on, pulled her IV out. IV consult order in.

## 2019-09-17 NOTE — Progress Notes (Signed)
Subjective: 12 Days Post-Op Procedure(s) (LRB): TOTAL HIP ARTHROPLASTY (Left) NG Tube removed in the p.m. because of twisting of the tube.  They are going to reinsert this. Patient to undergo dialysis today. No pain to the left hip. Patient is more alert and responsive this AM.  Objective: Vital signs in last 24 hours: Temp:  [98.3 F (36.8 C)-98.9 F (37.2 C)] 98.3 F (36.8 C) (02/27 2345) Pulse Rate:  [72-80] 74 (02/28 0429) Resp:  [16-18] 16 (02/28 0429) BP: (130-146)/(53-60) 133/53 (02/28 0429) SpO2:  [97 %-100 %] 98 % (02/28 0429) Weight:  [80.5 kg] 80.5 kg (02/28 0550)  Intake/Output from previous day:  Intake/Output Summary (Last 24 hours) at 09/17/2019 0629 Last data filed at 09/16/2019 1825 Gross per 24 hour  Intake 971 ml  Output --  Net 971 ml    Intake/Output this shift: No intake/output data recorded.  Labs: Recent Labs    09/15/19 0433 09/16/19 0505 09/17/19 0452  HGB 8.6* 8.8* 8.5*   Recent Labs    09/16/19 0505 09/17/19 0452  WBC 15.9* 13.7*  RBC 2.87* 2.79*  HCT 29.4* 28.6*  PLT 428* 483*   Recent Labs    09/16/19 0505 09/17/19 0452  NA 143 140  K 3.6 3.6  CL 98 100  CO2 27 28  BUN 64* 85*  CREATININE 6.30* 8.50*  GLUCOSE 136* 112*  CALCIUM 8.8* 8.6*   No results for input(s): LABPT, INR in the last 72 hours.   EXAM General - Patient is awake and more alert this morning Dressing intact to the left hip, no drainage. Patient is moving toes and feet this AM. Negative Homan to the left leg.  Past Medical History:  Diagnosis Date  . Adult celiac disease   . Anemia associated with chronic renal failure 2017   blood transfusion last week 10/17  . Arthritis   . Asthma   . Cancer ([place]) 2017   bladder  . Chronic kidney disease   . 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  dr. Candiss Norse and dr. Holley Raring follow her  . COPD (chronic obstructive pulmonary disease) ([place])   . Coronary artery disease   . Diabetes mellitus  without complication ([name])   . Dialysis patient ([place])    Tues, Thurs, Sat  . Dyspnea    with exertion  . Elevated lipids   . GERD (gastroesophageal reflux disease)   . Hematuria   . Hypertension   . Lower back pain   . Neuropathy   . Oxygen dependent    at hs  . Personal history of chemotherapy 2017   bladder ca  . Personal history of radiation therapy 2017   bladder ca  . Sleep apnea    uses cpap  . Urinary obstruction 01/2016   Assessment/Plan: 12 Days Post-Op Procedure(s) (LRB): TOTAL HIP ARTHROPLASTY (Left) Principal Problem:   Status post total hip replacement, left Active Problems:   Anemia in chronic kidney disease   COPD (chronic obstructive pulmonary disease) (HCC)   CAD in native artery   Benign essential HTN   Acid reflux   Seizure (HCC)   Type II diabetes mellitus with renal manifestations (HCC)   ESRD on dialysis ([place])   Aspiration pneumonia ([place])   Acute encephalopathy   Goals of care, counseling/discussion  Estimated body mass index is 28.64 kg/m as calculated from the following:   Height as of this encounter: 5' 6"  (1.676 m).   Weight as of this encounter: 80.5 kg.  Dressing intact to the left hip. NG Tube removed last night.  Plan to reinsert. Continue with therapy. No acute changes to the left hip. Continue to monitor progress. Will plan on staple removal for the patient on Monday or Tuesday of next week.  Barbara Dixon, PA-C [facility] Orthopaedic Surgery 09/17/2019, 6:29 AM

## 2019-09-18 LAB — BASIC METABOLIC PANEL WITH GFR
Anion gap: 18 — ABNORMAL HIGH (ref 5–15)
BUN: 98 mg/dL — ABNORMAL HIGH (ref 8–23)
CO2: 24 mmol/L (ref 22–32)
Calcium: 8.6 mg/dL — ABNORMAL LOW (ref 8.9–10.3)
Chloride: 99 mmol/L (ref 98–111)
Creatinine, Ser: 10.04 mg/dL — ABNORMAL HIGH (ref 0.44–1.00)
GFR calc Af Amer: 4 mL/min — ABNORMAL LOW (ref >60)
GFR calc non Af Amer: 3 mL/min — ABNORMAL LOW (ref >60)
Glucose, Bld: 99 mg/dL (ref 70–99)
Potassium: 4 mmol/L (ref 3.5–5.1)
Sodium: 141 mmol/L (ref 135–145)

## 2019-09-18 LAB — CBC
HCT: 29.1 % — ABNORMAL LOW (ref 36.0–46.0)
Hemoglobin: 8.6 g/dL — ABNORMAL LOW (ref 12.0–15.0)
MCH: 30.3 pg (ref 26.0–34.0)
MCHC: 29.6 g/dL — ABNORMAL LOW (ref 30.0–36.0)
MCV: 102.5 fL — ABNORMAL HIGH (ref 80.0–100.0)
Platelets: 527 10*3/uL — ABNORMAL HIGH (ref 150–400)
RBC: 2.84 MIL/uL — ABNORMAL LOW (ref 3.87–5.11)
RDW: 14.6 % (ref 11.5–15.5)
WBC: 12.8 10*3/uL — ABNORMAL HIGH (ref 4.0–10.5)
nRBC: 0.2 % (ref 0.0–0.2)

## 2019-09-18 LAB — PHOSPHORUS: Phosphorus: 7.4 mg/dL — ABNORMAL HIGH (ref 2.5–4.6)

## 2019-09-18 LAB — GLUCOSE, CAPILLARY
Glucose-Capillary: 99 mg/dL (ref 70–99)
Glucose-Capillary: 99 mg/dL (ref 70–99)
Glucose-Capillary: 98 mg/dL (ref 70–99)
Glucose-Capillary: 100 mg/dL — ABNORMAL HIGH (ref 70–99)
Glucose-Capillary: 90 mg/dL (ref 70–99)
Glucose-Capillary: 81 mg/dL (ref 70–99)

## 2019-09-18 LAB — MAGNESIUM: Magnesium: 2.6 mg/dL — ABNORMAL HIGH (ref 1.7–2.4)

## 2019-09-18 MED ORDER — DEXTROSE 10 % IV SOLN: INTRAVENOUS | Status: DC

## 2019-09-18 NOTE — Progress Notes (Signed)
This note also relates to the following rows which could not be included: Pulse Rate - Cannot attach notes to unvalidated device data Resp - Cannot attach notes to unvalidated device data SpO2 - Cannot attach notes to unvalidated device data  Hd started

## 2019-09-18 NOTE — Consult Note (Signed)
GI Inpatient Consult Note  Reason for Consult: PEG Tube Consult   Attending Requesting Consult: Dr. Nicole Kindred, DO  History of Present Illness: Barbara [name] is a 79 y.o. female seen for evaluation of potential PEG tube placement at the request of Dr. Arbutus Ped. Pt has a PMH of HTN, HLD, diet-controlled diabetes, COPD, asthma, GERD, ESRD-HD (MWF), OSA, CAD, Hx of bladder cancer, and IDA and was admitted by Dr. Roland Rack for left total hip arthroplasty. After surgery she developed acute respiratory distress and was eventually intubated and on mechanical ventilation in the ICU. She was also treated for aspiration pneumonia with IV abx. Nephrology was also consulted and pt was placed on CRRT and then transitioned to HD. She was extubated on 02/19 and pt was then transferred to medical floor. She had NG tube placed multiple times with patient continuing to pull out of her NG tube. GI was consulted in the context of PEG tube placement. Patient appears comfortable in hospital bed in no acute distress. She was seen by speech therapy 02/27 by Donzetta Starch where she reportedly had refused speech evaluation. She was placed on nectar thick liquids and NG tube for nutritional support, but pt again pulled out NG tube. Speech therapy has not evaluated patient today. Patient is not able to provide much information to the medical history. Husband reports he has been by her bedside everyday and he is confused why she is not swallowing. He reports he saw patient swallow liquids yesterday. He reports he wants to do what is right for his wife and what will make her prognosis better. He reports pt has had several episodes of confusion and altered mental status after anesthesia, but has always bounced back quickly. He has several questions about PEG tube indications, procedure details, and pros/cons of procedure.    Past Medical History:  Past Medical History:  Diagnosis Date  . Adult celiac disease   . Anemia associated  with chronic renal failure 2017   blood transfusion last week 10/17  . Arthritis   . Asthma   . Cancer ([place]) 2017   bladder  . Chronic kidney disease   . 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  dr. Candiss Norse and dr. Holley Raring follow her  . COPD (chronic obstructive pulmonary disease) ([place])   . Coronary artery disease   . Diabetes mellitus without complication ([place])   . Dialysis patient ([place])    Tues, Thurs, Sat  . Dyspnea    with exertion  . Elevated lipids   . GERD (gastroesophageal reflux disease)   . Hematuria   . Hypertension   . Lower back pain   . Neuropathy   . Oxygen dependent    at hs  . Personal history of chemotherapy 2017   bladder ca  . Personal history of radiation therapy 2017   bladder ca  . Sleep apnea    uses cpap  . Urinary obstruction 01/2016    Problem List: Patient Active Problem List   Diagnosis Date Noted  . Acute encephalopathy   . Goals of care, counseling/discussion   . Aspiration pneumonia ([place]) 09/11/2019  . Status post total hip replacement, left 09/05/2019  . Type II diabetes mellitus with renal manifestations ([place]) 09/05/2019  . ESRD on dialysis ([place]) 09/05/2019  . Bacteremia due to other bacteria   . Bacteroides fragilis infection   . Acute metabolic encephalopathy   . Somnolence   . Lobar pneumonia ([place]) 05/11/2019  . Dialysis patient ([place])   .  Primary osteoarthritis of left hip 04/07/2019  . Metabolic acidosis 29/51/8841  . Complication from renal dialysis device 01/28/2018  . History of colonic polyps 07/30/2017  . Presence of cardiac and vascular implant and graft   . Fever   . Sepsis ([place])   . ESRD (end stage renal disease) ([place]) 02/01/2017  . Sepsis secondary to UTI ([place]) 01/24/2017  . Acute kidney injury ([place]) 11/19/2016  . Anemia, chronic renal failure, stage 4 (severe) ([place]) 09/17/2016  . Hypoglycemia 06/04/2016  . Hyperkalemia 05/12/2016  . Protein-calorie malnutrition, severe 05/05/2016  . Acute on  chronic renal failure ([place]) 05/04/2016  . Seizure ([place]) 04/17/2016  . Palliative care by specialist   . DNR (do not resuscitate) discussion   . C. difficile diarrhea 03/11/2016  . Anemia 03/11/2016  . Hypotension 03/11/2016  . Acidosis 03/11/2016  . Failure to thrive (child) 03/11/2016  . Weakness generalized 03/11/2016  . ARF (acute renal failure) ([place]) 03/04/2016  . Cancer of trigone of urinary bladder ([place]) 03/02/2016  . Absolute anemia 02/04/2016  . Airway hyperreactivity 02/04/2016  . Celiac disease 02/04/2016  . Gastric catarrh 02/04/2016  . Acid reflux 02/04/2016  . Combined fat and carbohydrate induced hyperlipemia 02/04/2016  . C. difficile colitis 01/22/2016  . Urinary obstruction 01/21/2016  . COPD (chronic obstructive pulmonary disease) ([place]) 01/21/2016  . Controlled type 2 diabetes mellitus with stage 4 chronic kidney disease, with long-term current use of insulin ([place]) 01/21/2016  . Essential hypertension 01/21/2016  . Acute renal failure superimposed on stage 4 chronic kidney disease ([place]) 01/20/2016  . Anemia in chronic kidney disease 01/20/2016  . Arthritis of knee, degenerative 05/10/2015  . Knee strain 03/26/2015  . Other intervertebral disc displacement, lumbar region 03/04/2015  . Degenerative arthritis of lumbar spine 03/04/2015  . HNP (herniated nucleus pulposus), lumbar 03/04/2015  . Osteoarthritis of spine with radiculopathy, lumbar region 03/04/2015  . Injury of tendon of upper extremity 11/12/2014  . Atherosclerosis of abdominal aorta ([place]) 11/02/2014  . Chronic kidney disease, stage IV (severe) ([place]) 11/02/2014  . Obstructive apnea 11/02/2014  . Complete rotator cuff rupture of left shoulder 10/05/2014  . Arthritis of shoulder region, degenerative 10/05/2014  . CAD in native artery 12/08/2013  . Benign essential HTN 12/08/2013  . Type 2 diabetes mellitus ([place]) 12/08/2013    Past Surgical History: Past Surgical History:  Procedure Laterality Date   . A/V SHUNT INTERVENTION N/A 05/12/2018   Procedure: A/V SHUNT INTERVENTION;  Surgeon: Algernon Huxley, MD;  Location: [facility];  Service: Cardiovascular;  Laterality: N/A;  . A/V SHUNT INTERVENTION Left 07/29/2018   Procedure: A/V SHUNT INTERVENTION;  Surgeon: Katha Cabal, MD;  Location: [facility];  Service: Cardiovascular;  Laterality: Left;  . ABDOMINAL HYSTERECTOMY    . AV FISTULA PLACEMENT Left 02/17/2017   Procedure: INSERTION OF ARTERIOVENOUS (AV) GORE-TEX GRAFT ARM(BRACHIAL AXILLARY);  Surgeon: Katha Cabal, MD;  Location: [facility];  Service: Vascular;  Laterality: Left;  . CYSTOSCOPY W/ RETROGRADES Bilateral 02/17/2016   Procedure: CYSTOSCOPY WITH RETROGRADE PYELOGRAM;  Surgeon: Hollice Espy, MD;  Location: [facility];  Service: Urology;  Laterality: Bilateral;  . CYSTOSCOPY W/ RETROGRADES Bilateral 10/12/2016   Procedure: CYSTOSCOPY WITH RETROGRADE PYELOGRAM;  Surgeon: Hollice Espy, MD;  Location: [facility];  Service: Urology;  Laterality: Bilateral;  . CYSTOSCOPY W/ RETROGRADES Bilateral 06/09/2017   Procedure: CYSTOSCOPY WITH RETROGRADE PYELOGRAM;  Surgeon: Hollice Espy, MD;  Location: [facility];  Service: Urology;  Laterality: Bilateral;  . CYSTOSCOPY W/  RETROGRADES Bilateral 10/07/2017   Procedure: CYSTOSCOPY WITH RETROGRADE PYELOGRAM;  Surgeon: Hollice Espy, MD;  Location: [facility];  Service: Urology;  Laterality: Bilateral;  . CYSTOSCOPY W/ URETERAL STENT PLACEMENT Left 05/12/2016   Procedure: CYSTOSCOPY WITH STENT REPLACEMENT;  Surgeon: Hollice Espy, MD;  Location: [facility];  Service: Urology;  Laterality: Left;  . CYSTOSCOPY W/ URETERAL STENT PLACEMENT Left 10/12/2016   Procedure: CYSTOSCOPY WITH STENT REPLACEMENT;  Surgeon: Hollice Espy, MD;  Location: [facility];  Service: Urology;  Laterality: Left;  . CYSTOSCOPY WITH BIOPSY N/A 06/09/2017   Procedure: CYSTOSCOPY WITH BLADDER BIOPSY;  Surgeon: Hollice Espy, MD;  Location: [facility];   Service: Urology;  Laterality: N/A;  . CYSTOSCOPY WITH STENT PLACEMENT Left 01/21/2016   Procedure: CYSTOSCOPY WITH double J STENT PLACEMENT;  Surgeon: Franchot Gallo, MD;  Location: [facility];  Service: Urology;  Laterality: Left;  . CYSTOSCOPY WITH STENT PLACEMENT Right 10/12/2016   Procedure: CYSTOSCOPY WITH STENT PLACEMENT;  Surgeon: Hollice Espy, MD;  Location: [facility];  Service: Urology;  Laterality: Right;  . DIALYSIS/PERMA CATHETER INSERTION N/A 11/20/2016   Procedure: Dialysis/Perma Catheter Insertion;  Surgeon: Algernon Huxley, MD;  Location: [facility];  Service: Cardiovascular;  Laterality: N/A;  . DIALYSIS/PERMA CATHETER REMOVAL N/A 03/30/2017   Procedure: DIALYSIS/PERMA CATHETER REMOVAL;  Surgeon: Katha Cabal, MD;  Location: [facility];  Service: Cardiovascular;  Laterality: N/A;  . KIDNEY SURGERY  01/21/2016   IR NEPHROSTOMY PLACEMENT LEFT   . PERIPHERAL VASCULAR CATHETERIZATION N/A 04/07/2016   Procedure: Glori Luis Cath Insertion;  Surgeon: Katha Cabal, MD;  Location: [facility];  Service: Cardiovascular;  Laterality: N/A;  . PERIPHERAL VASCULAR THROMBECTOMY Left 06/02/2017   Procedure: PERIPHERAL VASCULAR THROMBECTOMY;  Surgeon: Algernon Huxley, MD;  Location: [facility];  Service: Cardiovascular;  Laterality: Left;  . PERIPHERAL VASCULAR THROMBECTOMY Left 01/28/2018   Procedure: PERIPHERAL VASCULAR THROMBECTOMY;  Surgeon: Katha Cabal, MD;  Location: [facility];  Service: Cardiovascular;  Laterality: Left;  . PERIPHERAL VASCULAR THROMBECTOMY Left 03/14/2018   Procedure: PERIPHERAL VASCULAR THROMBECTOMY;  Surgeon: Algernon Huxley, MD;  Location: [facility];  Service: Cardiovascular;  Laterality: Left;  . PERIPHERAL VASCULAR THROMBECTOMY Left 03/16/2018   Procedure: PERIPHERAL VASCULAR THROMBECTOMY;  Surgeon: Katha Cabal, MD;  Location: [facility];  Service: Cardiovascular;  Laterality: Left;  . PORTA  CATH REMOVAL N/A 02/07/2019   Procedure: PORTA CATH REMOVAL;  Surgeon: Katha Cabal, MD;  Location: [facility];  Service: Cardiovascular;  Laterality: N/A;  . PORTACATH PLACEMENT Right   . ROTATOR CUFF REPAIR Left   . TEMPORARY DIALYSIS CATHETER N/A 05/11/2018   Procedure: TEMPORARY DIALYSIS CATHETER;  Surgeon: Katha Cabal, MD;  Location: [facility];  Service: Cardiovascular;  Laterality: N/A;  . TEMPORARY DIALYSIS CATHETER Left 07/27/2018   Procedure: TEMPORARY DIALYSIS CATHETER;  Surgeon: Katha Cabal, MD;  Location: [facility];  Service: Cardiovascular;  Laterality: Left;  . TOTAL HIP ARTHROPLASTY Left 09/05/2019   Procedure: TOTAL HIP ARTHROPLASTY;  Surgeon: Corky Mull, MD;  Location: [facility];  Service: Orthopedics;  Laterality: Left;  . TRANSURETHRAL RESECTION OF BLADDER TUMOR N/A 02/17/2016   Procedure: TRANSURETHRAL RESECTION OF BLADDER TUMOR (TURBT)-LARGE;  Surgeon: Hollice Espy, MD;  Location: [facility];  Service: Urology;  Laterality: N/A;  . TRANSURETHRAL RESECTION OF BLADDER TUMOR N/A 10/07/2017   Procedure: TRANSURETHRAL RESECTION OF BLADDER TUMOR (TURBT)-small;  Surgeon: Hollice Espy,  MD;  Location: [facility];  Service: Urology;  Laterality: N/A;  . URETEROSCOPY Left 02/17/2016   Procedure: URETEROSCOPY;  Surgeon: Hollice Espy, MD;  Location: [facility];  Service: Urology;  Laterality: Left;  . URETEROSCOPY Right 10/12/2016   Procedure: URETEROSCOPY;  Surgeon: Hollice Espy, MD;  Location: [facility];  Service: Urology;  Laterality: Right;    Allergies: Allergies  Allergen Reactions  . Glipizide Er Other (See Comments)    Hypoglycemia   . Percocet [Oxycodone-Acetaminophen] Hives    Home Medications: Medications Prior to Admission  Medication Sig Dispense Refill Last Dose  . acetaminophen (TYLENOL) 500 MG tablet Take 1,000 mg by mouth 2 (two) times daily as needed for moderate pain or headache.    09/04/2019 at Unknown time  .  albuterol (PROVENTIL HFA;VENTOLIN HFA) 108 (90 Base) MCG/ACT inhaler Inhale 2 puffs into the lungs every 6 (six) hours as needed for wheezing or shortness of breath.    09/05/2019 at Unknown time  . atorvastatin (LIPITOR) 40 MG tablet Take 40 mg by mouth every evening.   09/04/2019 at Unknown time  . calcium acetate (PHOSLO) 667 MG capsule Take 2 capsules (1,334 mg total) by mouth 3 (three) times daily with meals. (Patient taking differently: Take 1,334 mg by mouth 3 (three) times daily. ) 90 capsule 0 09/04/2019 at Unknown time  . calcium carbonate (TUMS - DOSED IN MG ELEMENTAL CALCIUM) 500 MG chewable tablet Chew 1 tablet by mouth daily.   09/04/2019 at Unknown time  . cholecalciferol (VITAMIN D) 1000 units tablet Take 1,000 Units by mouth daily.   09/04/2019 at Unknown time  . cinacalcet (SENSIPAR) 30 MG tablet Take 30 mg by mouth daily. With largest meal   09/04/2019 at Unknown time  . esomeprazole (NEXIUM) 40 MG capsule Take 40 mg by mouth daily after breakfast.    09/04/2019 at Unknown time  . ferrous sulfate 325 (65 FE) MG EC tablet Take 325 mg by mouth daily after breakfast.    09/04/2019 at Unknown time  . furosemide (LASIX) 40 MG tablet Take 40 mg by mouth daily.    09/04/2019 at Unknown time  . gabapentin (NEURONTIN) 300 MG capsule Take 600 mg by mouth 3 (three) times daily.   09/04/2019 at Unknown time  . ibuprofen (ADVIL) 200 MG tablet Take 200 mg by mouth every 8 (eight) hours as needed (for pain.).    Past Week at Unknown time  . loperamide (IMODIUM) 1 MG/5ML solution Take 2 mg by mouth as needed for diarrhea or loose stools.   09/04/2019 at Unknown time  . losartan (COZAAR) 50 MG tablet Take 50 mg by mouth every evening.   09/04/2019 at Unknown time  . multivitamin (RENA-VIT) TABS tablet Take 1 tablet by mouth at bedtime.  0 09/04/2019 at Unknown time  . oxybutynin (DITROPAN) 5 MG tablet Take 5 mg by mouth 3 (three) times daily.   09/04/2019 at Unknown time  . vitamin C (VITAMIN C) 500 MG tablet  Take 1 tablet (500 mg total) by mouth 2 (two) times daily.   09/04/2019 at Unknown time  . Blood Glucose Monitoring Suppl (ACCU-CHEK AVIVA PLUS) w/Device KIT (USE TO MONITOR BLOOD SUGARS.)  0   . NON FORMULARY Place 1 Units into the nose at bedtime. CPAP Time of use 2100-0600     . Nutritional Supplements (FEEDING SUPPLEMENT, NEPRO CARB STEADY,) LIQD Take 237 mLs by mouth 2 (two) times daily between meals.  0   . OXYGEN Inhale 2 L into the  lungs at bedtime.      Home medication reconciliation was completed with the patient.   Scheduled Inpatient Medications:   . B-complex with vitamin C  1 tablet Per Tube QHS  . chlorhexidine  15 mL Mouth/Throat QID  . epoetin (EPOGEN/PROCRIT) injection  10,000 Units Intravenous Q M,W,F-HD  . feeding supplement (NEPRO CARB STEADY)  1,000 mL Oral Q24H  . feeding supplement (PRO-STAT SUGAR FREE 64)  30 mL Per Tube BID  . free water  50 mL Per Tube Q4H  . heparin injection (subcutaneous)  5,000 Units Subcutaneous Q12H  . mouth rinse  15 mL Mouth Rinse BID    Continuous Inpatient Infusions:   . sodium chloride    . famotidine (PEPCID) IV 20 mg (09/17/19 1752)  . lacosamide (VIMPAT) IV Stopped (09/18/19 1104)  . piperacillin-tazobactam (ZOSYN)  IV 3.375 g (09/18/19 1524)    PRN Inpatient Medications:  acetaminophen (TYLENOL) oral liquid 160 mg/5 mL, acetaminophen, acetaminophen, bisacodyl, hydrALAZINE, ipratropium-albuterol, ondansetron **OR** ondansetron (ZOFRAN) IV  Family History: family history includes Cancer in her father; Diabetes in her mother.  The patient's family history is negative for inflammatory bowel disorders, GI malignancy, or solid organ transplantation.  Social History:   reports that she quit smoking about 17 years ago. Her smoking use included cigarettes. She has never used smokeless tobacco. She reports that she does not drink alcohol or use drugs. The patient denies ETOH, tobacco, or drug use.   Review of Systems:  Unable to  obtain due to patient's mental status  Physical Examination: BP (!) 135/48 (BP Location: Right Arm)   Pulse 76   Temp 97.7 F (36.5 C) (Oral)   Resp 14   Ht 5' 6"  (1.676 m)   Wt 83.6 kg   SpO2 100%   BMI 29.75 kg/m  Gen: NAD, alert to self and place HEENT: PEERLA, EOMI, Neck: supple, no JVD or thyromegaly Chest: CTA bilaterally, no wheezes, crackles, or other adventitious sounds CV: RRR, no m/g/c/r Abd: soft, NT, ND, +BS in all four quadrants; no HSM, guarding, ridigity, or rebound tenderness Ext: no edema, well perfused with 2+ pulses, Skin: no rash or lesions noted Lymph: no LAD  Data: Lab Results  Component Value Date   WBC 12.8 (H) 09/18/2019   HGB 8.6 (L) 09/18/2019   HCT 29.1 (L) 09/18/2019   MCV 102.5 (H) 09/18/2019   PLT 527 (H) 09/18/2019   Recent Labs  Lab 09/16/19 0505 09/17/19 0452 09/18/19 0305  HGB 8.8* 8.5* 8.6*   Lab Results  Component Value Date   NA 141 09/18/2019   K 4.0 09/18/2019   CL 99 09/18/2019   CO2 24 09/18/2019   BUN 98 (H) 09/18/2019   CREATININE 10.04 (H) 09/18/2019   Lab Results  Component Value Date   ALT 10 09/14/2019   AST 37 09/14/2019   ALKPHOS 63 09/14/2019   BILITOT 0.5 09/14/2019   No results for input(s): APTT, INR, PTT in the last 168 hours. Assessment/Plan:  79 y/o AA female with a PMH of HTN, HLD, diet-controlled diabetes, COPD, asthma, GERD, ESRD-HD (MWF), OSA, CAD, Hx of bladder cancer, and IDA admitted for total hip arthroplasty complicated by acute hypoxic respiratory failure s/p intubation and mechanical ventilation  1. Dysphagia 2. Aspiration pneumonia 3. Acute toxic metabolic encephalopathy 4. Multiple medical comorbidities  -We discussed PEG tube indications, procedure details, and pros/cons of PEG tube placement. We discussed PEG tube placement does not change patient's prognosis and there still is a risk of  patient pulling PEG tube out. -Appreciate speech therapy input. I would like for speech  therapy to re-evaluate patient tomorrow -We discussed importance of patient being able to obtain her medications and nutritional support that her body needs for survival -[place] placement via IR if needed, otherwise can plan on PEG tube placement on Wednesday with Dr. Alice Reichert -All of the husband's questions were answered to the best of my ability. -Further recommendations after speech therapy evaluation tomorrow -Following    Thank you for the consult. Please call with questions or concerns.  Reeves Forth [facility] [phone] (Cell)

## 2019-09-18 NOTE — Care Management Important Message (Signed)
Important Message  Patient Details  Name: Barbara [name] MRN: [id number] Date of Birth: 04-02-1941   Medicare Important Message Given:  Yes     Juliann Pulse A [name] 09/18/2019, 10:29 AM

## 2019-09-18 NOTE — Progress Notes (Signed)
[facility]  ROUNDING NOTE   Subjective:   Patient seen during dialysis Tolerating well   HEMODIALYSIS FLOWSHEET:  Blood Flow Rate (mL/min): 400 mL/min Arterial Pressure (mmHg): -190 mmHg Venous Pressure (mmHg): 230 mmHg Transmembrane Pressure (mmHg): 60 mmHg Ultrafiltration Rate (mL/min): 580 mL/min Dialysate Flow Rate (mL/min): 800 ml/min Conductivity: Machine : 14.1 Conductivity: Machine : 14.1 Dialysis Fluid Bolus: Normal Saline Bolus Amount (mL): 250 mL Dialysate Change: 2K  Still confused  Objective:  Vital signs in last 24 hours:  Temp:  [97.5 F (36.4 C)-99 F (37.2 C)] 98 F (36.7 C) (03/01 1055) Pulse Rate:  [67-79] 78 (03/01 1345) Resp:  [15-26] 25 (03/01 1345) BP: (112-147)/(38-78) 112/78 (03/01 1345) SpO2:  [99 %-100 %] 100 % (03/01 1100) Weight:  [83.6 kg] 83.6 kg (03/01 1055)  Weight change:  Filed Weights   09/16/19 0500 09/17/19 0550 09/18/19 1055  Weight: 82.3 kg 80.5 kg 83.6 kg    Intake/Output: I/O last 3 completed shifts: In: 580.3 [I.V.:60; IV Piggyback:520.3] Out: -    Intake/Output this shift:  Total I/O In: 65.2 [IV Piggyback:65.2] Out: -    Physical Exam: General:  Laying in the bed, no acute distress  HEENT:  Anicteric, moist oral mucous membranes  Lungs:   Normal breathing effort  Heart:  No rub  Abdomen:   Soft, nontender  Extremities:  No peripheral edema  Neurologic:  Alert, able to answer few questions, confused  Skin:  Warm  Access:  AV graft    Basic Metabolic Panel: Recent Labs  Lab 09/13/19 1217 09/13/19 1217 09/14/19 0303 09/14/19 0303 09/15/19 0433 09/15/19 0433 09/16/19 0505 09/17/19 0452 09/18/19 0305  NA 141   < > 142  --  142  --  143 140 141  K 3.9   < > 3.8  --  3.8  --  3.6 3.6 4.0  CL 100   < > 99  --  101  --  98 100 99  CO2 25   < > 30  --  27  --  27 28 24   GLUCOSE 144*   < > 126*  --  126*  --  136* 112* 99  BUN 88*   < > 53*  --  81*  --  64* 85* 98*  CREATININE 7.46*    < > 5.56*  --  7.80*  --  6.30* 8.50* 10.04*  CALCIUM 8.2*   < > 8.5*   < > 8.5*   < > 8.8* 8.6* 8.6*  MG  --   --  2.1  --  2.5*  --  2.2 2.4 2.6*  PHOS 2.3*  --   --   --  4.0  --  4.3 5.8* 7.4*   < > = values in this interval not displayed.    Liver Function Tests: Recent Labs  Lab 09/13/19 0737 09/13/19 1217 09/14/19 0303  AST 50*  --  37  ALT 13  --  10  ALKPHOS 67  --  63  BILITOT 0.5  --  0.5  PROT 7.3  --  6.9  ALBUMIN 2.2* 2.3* 2.1*   No results for input(s): LIPASE, AMYLASE in the last 168 hours. Recent Labs  Lab 09/12/19 1704  AMMONIA 24    CBC: Recent Labs  Lab 09/14/19 0303 09/15/19 0433 09/16/19 0505 09/17/19 0452 09/18/19 0305  WBC 17.6* 19.7* 15.9* 13.7* 12.8*  NEUTROABS 14.0*  --   --   --   --   HGB  9.1* 8.6* 8.8* 8.5* 8.6*  HCT 29.9* 28.4* 29.4* 28.6* 29.1*  MCV 100.0 101.4* 102.4* 102.5* 102.5*  PLT 336 434* 428* 483* 527*    Cardiac Enzymes: No results for input(s): CKTOTAL, CKMB, CKMBINDEX, TROPONINI in the last 168 hours.  BNP: Invalid input(s): POCBNP  CBG: Recent Labs  Lab 09/17/19 2354 09/18/19 0412 09/18/19 0806 09/18/19 1131 09/18/19 1253  GLUCAP 95 99 99 98 100*    Microbiology: Results for orders placed or performed during the hospital encounter of 09/05/19  MRSA PCR Screening     Status: Abnormal   Collection Time: 09/06/19  7:50 AM   Specimen: Nasal Mucosa; Nasopharyngeal  Result Value Ref Range Status   MRSA by PCR POSITIVE (A) NEGATIVE Final    Comment:        The GeneXpert MRSA Assay (FDA approved for NASAL specimens only), is one component of a comprehensive MRSA colonization surveillance program. It is not intended to diagnose MRSA infection nor to guide or monitor treatment for MRSA infections. RESULT CALLED TO, READ BACK BY AND VERIFIED WITH: MEGAN SHEFFIELD AT 0931 ON 09/06/2019 [place]. Performed at [facility], [address] [place], [place] [address]     Coagulation Studies: No  results for input(s): LABPROT, INR in the last 72 hours.  Urinalysis: No results for input(s): COLORURINE, LABSPEC, PHURINE, GLUCOSEU, HGBUR, BILIRUBINUR, KETONESUR, PROTEINUR, UROBILINOGEN, NITRITE, LEUKOCYTESUR in the last 72 hours.  Invalid input(s): APPERANCEUR    Imaging: DG Chest 1 View  Result Date: 09/17/2019 CLINICAL DATA:  Feeding tube placement EXAM: CHEST  1 VIEW COMPARISON:  09/17/2019 FINDINGS: 0402 hours. The cardio pericardial silhouette is enlarged. There is pulmonary vascular congestion without overt pulmonary edema. Interstitial markings are diffusely coarsened with chronic features. Prominent skin fold noted over the left hemithorax. Feeding tube is coiled in the esophagus with the tip of the feeding tube at the esophagogastric junction. Telemetry leads overlie the chest. IMPRESSION: A relatively large length of the feeding tube is coiled in the esophagus with the tip at the esophagogastric junction. Given the large amount of redundant tubing in the esophagus, this should be pulled back with repeat placement. These results will be called to the ordering clinician or representative by the Radiologist Assistant, and communication documented in the PACS or zVision Dashboard. Electronically Signed   By: Misty Stanley M.D.   On: 09/17/2019 04:26   DG Chest 1 View  Result Date: 09/17/2019 CLINICAL DATA:  Enteric catheter placement, patient pulled on NG tube EXAM: CHEST  1 VIEW COMPARISON:  09/17/2019 FINDINGS: Single frontal view of the chest demonstrates a weighted tip of an enteric feeding catheter overlying the gastroesophageal junction. Cardiac silhouette is stable. Chronic central vascular congestion and diffuse interstitial prominence unchanged. Stable patchy consolidation at the lung bases. No effusion or pneumothorax. Vascular stent unchanged. IMPRESSION: 1. Enteric catheter overlying gastroesophageal junction. 2. Stable vascular congestion and bibasilar consolidation.  Electronically Signed   By: Randa Ngo M.D.   On: 09/17/2019 03:09   DG Chest 1 View  Result Date: 09/17/2019 CLINICAL DATA:  Enteric catheter placement, previous abnormal chest x-ray EXAM: CHEST  1 VIEW COMPARISON:  09/13/2019 FINDINGS: Frontal view of the chest was obtained, with the patient markedly rotated to the right. Weighted tip of an enteric catheter overlies the distal thoracic esophagus. The tip of the catheter is at least 4 cm from the gastroesophageal junction. The cardiac silhouette is enlarged but stable. There is persistent central vascular congestion. Patchy consolidation at the lung bases  slightly improved. Evaluation of the right lung base limited due to patient rotation. No large effusion or pneumothorax. Stable left-sided vascular stent. IMPRESSION: 1. Enteric catheter as above. Recommended advancing at least 4 cm to ensure intragastric placement. 2. Central vascular congestion and patchy bibasilar consolidation, slightly improved since prior study. Evaluation limited by positioning. Electronically Signed   By: Randa Ngo M.D.   On: 09/17/2019 00:30     Medications:   . sodium chloride    . famotidine (PEPCID) IV 20 mg (09/17/19 1752)  . lacosamide (VIMPAT) IV Stopped (09/18/19 1104)  . piperacillin-tazobactam (ZOSYN)  IV Stopped (09/18/19 1104)   . B-complex with vitamin C  1 tablet Per Tube QHS  . chlorhexidine  15 mL Mouth/Throat QID  . epoetin (EPOGEN/PROCRIT) injection  10,000 Units Intravenous Q M,W,F-HD  . feeding supplement (NEPRO CARB STEADY)  1,000 mL Oral Q24H  . feeding supplement (PRO-STAT SUGAR FREE 64)  30 mL Per Tube BID  . free water  50 mL Per Tube Q4H  . heparin injection (subcutaneous)  5,000 Units Subcutaneous Q12H  . mouth rinse  15 mL Mouth Rinse BID   acetaminophen (TYLENOL) oral liquid 160 mg/5 mL, acetaminophen, acetaminophen, bisacodyl, hydrALAZINE, ipratropium-albuterol, ondansetron **OR** ondansetron (ZOFRAN) IV  Assessment/ Plan:     [name] is a 79 y.o. black female with end stage renal disease on hemodialysis, hypertension, diabetes mellitus type II, diabetic neuropathy, seizure disorder who was admitted for left hip repair and unfortunate developed seizure episode postoperatively and subsequently intubated. Required vasopressors and placed on CRRT.   [facility] MWF [facility] LUE AVG 78.5kg   1.  ESRD on HD MWF.  Patient seen during dialysis Tolerating well Prior to discharge, patient needs to be able to sit in the chair  2.  Acute respiratory failure:  Required intubation this admission Extubated now  3.  Anemia of chronic kidney disease.   Lab Results  Component Value Date   HGB 8.6 (L) 09/18/2019  Continue Epogen with dialysis  4.  Secondary hyperparathyroidism.   Lab Results  Component Value Date   PTH 310 (H) 09/06/2019   CALCIUM 8.6 (L) 09/18/2019   CAION 0.85 (LL) 09/05/2019   PHOS 7.4 (H) 09/18/2019  Monitor closely  5. Encephalopathy: with seizure disorder.   Slow improvement   LOS: 13 Barbara [name] 3/1/20212:01 PM

## 2019-09-18 NOTE — Progress Notes (Signed)
Nutrition Follow-up  DOCUMENTATION CODES:   Not applicable  INTERVENTION:  Will monitor for plan of care regarding possible PEG tube placement. It may be beneficial for Palliative Medicine to discuss with family regarding PEG tube prior to placement to fully explore benefits, risks, and quality of life.  Once enteral access obtained and approved for use resume: -Nepro at 40 mL/hr (960 mL goal daily volume) + Pro-Stat 30 mL BID per tube (provides 1928 kcal, 108 grams of protein, 701 mL H2O daily) -Free water flush of 50 mL Q4hrs -B-complex with C QHS per tube  NUTRITION DIAGNOSIS:   Inadequate oral intake related to inability to eat as evidenced by NPO status.  Diet was advanced to nectar thick clear liquid but pt has ongoing poor PO intake.  GOAL:   Patient will meet greater than or equal to 90% of their needs  Not met at this time as there is no enteral access.  MONITOR:   Diet advancement, Labs, Weight trends, TF tolerance, I & O's  REASON FOR ASSESSMENT:   Malnutrition Screening Tool, Ventilator, Consult Enteral/tube feeding initiation and management  ASSESSMENT:   79 year old female with PMHx of COPD, HTN, GERD, CAD, asthma, celiac disease, ESRD on HD, DM, bladder cancer s/p TURBT followed by palliative gemcitabine and concurrent radiation. Patient was admitted for left total hip arthroplasty on 2/16 but on 2/17 developed respiratory distress, metabolic acidosis, and encephalopathy requiring intubation.  Unable to meet with patient at bedside as she was down in HD. According to chart patient pulled out her NGT again. Family decided to pursue PEG tube placement. MD has consulted GI to assess patient for possible PEG tube placement. Patient's diet was advanced to nectar-thick clear liquids on 2/25 but according to chart she has eaten 0% of her meals. Palliative Medicine is following patient.  Enteral Access: none at this time  Medications reviewed and include: B-complex  with C QHS per tube, Epogen 10000 units during HD, famotidine, Vimpat, Zosyn.  Labs reviewed: CBG 98-100, BUN 98, Creatinine 10.04, Phosphorus 7.4.  Diet Order:   Diet Order            Diet NPO time specified  Diet effective midnight             EDUCATION NEEDS:   Not appropriate for education at this time  Skin:  Skin Assessment: Skin Integrity Issues:(closed incision left hip)  Last BM:  09/18/2019 medium type 7  Height:   Ht Readings from Last 1 Encounters:  09/06/19 _0  (1.676 m)   Weight:   Wt Readings from Last 1 Encounters:  09/18/19 83.6 kg   Ideal Body Weight:  59.1 kg  BMI:  Body mass index is 29.75 kg/m.  Estimated Nutritional Needs:   Kcal:  1700-1900  Protein:  95-110 grams  Fluid:  1.8 L/day  Jacklynn Barnacle, MS, RD, LDN Pager number available on Amion

## 2019-09-18 NOTE — Progress Notes (Signed)
Subjective: 13 Days Post-Op Procedure(s) (LRB): TOTAL HIP ARTHROPLASTY (Left) Patient is well this morning.  Plan for re-insertion of NG tube today. Patient to undergo dialysis today.  On M, W, F schedule. No pain to the left hip. Awake but not alert this morning.  Objective: Vital signs in last 24 hours: Temp:  [97.8 F (36.6 C)-99 F (37.2 C)] 97.9 F (36.6 C) (02/28 2352) Pulse Rate:  [71-75] 72 (02/28 2352) Resp:  [16-17] 16 (02/28 2352) BP: (124-142)/(38-66) 142/56 (02/28 2352) SpO2:  [97 %-100 %] 100 % (02/28 2352)  Intake/Output from previous day:  Intake/Output Summary (Last 24 hours) at 09/18/2019 0742 Last data filed at 09/17/2019 1824 Gross per 24 hour  Intake 580.33 ml  Output --  Net 580.33 ml    Intake/Output this shift: No intake/output data recorded.  Labs: Recent Labs    09/16/19 0505 09/17/19 0452 09/18/19 0305  HGB 8.8* 8.5* 8.6*   Recent Labs    09/17/19 0452 09/18/19 0305  WBC 13.7* 12.8*  RBC 2.79* 2.84*  HCT 28.6* 29.1*  PLT 483* 527*   Recent Labs    09/17/19 0452 09/18/19 0305  NA 140 141  K 3.6 4.0  CL 100 99  CO2 28 24  BUN 85* 98*  CREATININE 8.50* 10.04*  GLUCOSE 112* 99  CALCIUM 8.6* 8.6*   No results for input(s): LABPT, INR in the last 72 hours.   EXAM General - Patient is awake and more alert this morning Dressing intact to the left hip, no drainage. Staples were removed this AM and steri-strips were applied. Patient is moving toes and feet this AM. Negative [place]oman to the left leg.  Past Medical History:  Diagnosis Date  . Adult celiac disease   . Anemia associated with chronic renal failure 2017   blood transfusion last week 10/17  . Arthritis   . Asthma   . Cancer ([place]) 2017   bladder  . Chronic kidney disease   . 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  dr. Candiss Norse and dr. Holley Raring follow her  . COPD (chronic obstructive pulmonary disease) ([place])   . Coronary artery disease   . Diabetes  mellitus without complication ([place])   . Dialysis patient ([place])    Tues, Thurs, Sat  . Dyspnea    with exertion  . Elevated lipids   . GERD (gastroesophageal reflux disease)   . Hematuria   . Hypertension   . Lower back pain   . Neuropathy   . Oxygen dependent    at hs  . Personal history of chemotherapy 2017   bladder ca  . Personal history of radiation therapy 2017   bladder ca  . Sleep apnea    uses cpap  . Urinary obstruction 01/2016   Assessment/Plan: 13 Days Post-Op Procedure(s) (LRB): TOTAL HIP ARTHROPLASTY (Left) Principal Problem:   Status post total hip replacement, left Active Problems:   Anemia in chronic kidney disease   COPD (chronic obstructive pulmonary disease) (HCC)   CAD in native artery   Benign essential HTN   Acid reflux   Seizure (HCC)   Type II diabetes mellitus with renal manifestations (HCC)   ESRD on dialysis ([place])   Aspiration pneumonia ([place])   Acute encephalopathy   Goals of care, counseling/discussion  Estimated body mass index is 28.64 kg/m as calculated from the following:   Height as of this encounter: 5' 6"  (1.676 m).   Weight as of this encounter: 80.5 kg.  Staples removed this AM, Steri-strips applied. Can get the left hip incision wet but do not submerge it underwater. Continue with therapy. Continue to monitor progress. Dialysis today.  Barbara Elizabth Palka, PA-C [facility] Orthopaedic Surgery 09/18/2019, 7:42 AM

## 2019-09-18 NOTE — Progress Notes (Signed)
Physical Therapy Treatment Patient Details Name: Barbara [name] MRN: [id number] DOB: Nov 24, 1940 Today's Date: 09/18/2019    History of Present Illness Per MD:  "79 y.o. female with medical history significant of hypertension, hyperlipidemia, diet-controlled diabetes, COPD, asthma, GERD, ESRD-HD (MWF), OSA, CAD, bladder cancer, iron deficiency anemia.  She was admitted by Dr. Roland Rack for left total hip arthroplasty.  She developed acute respiratory distress after surgery.  She was subsequently transferred to the ICU for acute hypoxemic and hypercapnic respiratory failure.  Initially, she was treated with BiPAP.  However, she failed BiPAP therapy and required intubation and mechanical ventilation in the ICU.  She was also treated with IV antibiotics for aspiration pneumonia." Pt also recieved CRRT and transitioned to HD during this stay, as well as vasopressors for hypotension, MRI performed due to AMS and seizure like activity, MRI negative for acute findings. Cardiology consulted for bradycardia and sinus pauses, no indication for pacemaker, conservative approach. Pt extubated 09/08/2019 and transferred to medical floor.    PT Comments    Pt isn't making good progress towards goals and refuses/unable to participate in active movement in B LE. Mits on/applied and pt with limited ability/willingness to move B UE on command. Pt initially reports she is willing to participate but then when asked to participate, no attempt noted. Pt scheduled for HD this date and staples removed per notes. Pt pleasant this date.  Follow Up Recommendations  SNF     Equipment Recommendations  Rolling walker with 5" wheels    Recommendations for Other Services       Precautions / Restrictions Precautions Precautions: Posterior Hip;Fall Precaution Comments: pt pulled out NG tube and IV, currently has mits on Restrictions Weight Bearing Restrictions: Yes LLE Weight Bearing: Weight bearing as tolerated    Mobility  Bed  Mobility               General bed mobility comments: unwilling to participate in repositioning. Pt propped up with pillows and resists when therapist attempts to move/reposition  Transfers                    Ambulation/Gait                 Stairs             Wheelchair Mobility    Modified Rankin (Stroke Patients Only)       Balance                                            Cognition Arousal/Alertness: Awake/alert Behavior During Therapy: Flat affect Overall Cognitive Status: Impaired/Different from baseline                                 General Comments: awake and willing to work with therapy, however then doesn't participate actively      Exercises Other Exercises Other Exercises: supine ther-ex attempted, however unwilling/unable to participate in active movement. Attempted B LE SLRs and SAQ.     General Comments        Pertinent Vitals/Pain Pain Assessment: No/denies pain    Home Living                      Prior Function  PT Goals (current goals can now be found in the care plan section) Acute Rehab PT Goals Patient Stated Goal: unable to state PT Goal Formulation: With family Time For Goal Achievement: 09/28/19 Potential to Achieve Goals: Fair Progress towards PT goals: Progressing toward goals    Frequency    Min 2X/week      PT Plan Current plan remains appropriate    Co-evaluation              AM-PAC PT "6 Clicks" Mobility   Outcome Measure  Help needed turning from your back to your side while in a flat bed without using bedrails?: Total Help needed moving from lying on your back to sitting on the side of a flat bed without using bedrails?: Total Help needed moving to and from a bed to a chair (including a wheelchair)?: Total Help needed standing up from a chair using your arms (e.g., wheelchair or bedside chair)?: Total Help needed to walk in  hospital room?: Total Help needed climbing 3-5 steps with a railing? : Total 6 Click Score: 6    End of Session Equipment Utilized During Treatment: Oxygen;Other (comment) Activity Tolerance: Patient limited by lethargy Patient left: in bed;with bed alarm set Nurse Communication: Mobility status PT Visit Diagnosis: Muscle weakness (generalized) (M62.81);Difficulty in walking, not elsewhere classified (R26.2);Other abnormalities of gait and mobility (R26.89);Pain Pain - Right/Left: Left Pain - part of body: Hip     Time: 6950-7225 PT Time Calculation (min) (ACUTE ONLY): 9 min  Charges:  $Therapeutic Activity: 8-22 mins                     Greggory Stallion, PT, DPT [phone]    Janise Gora 09/18/2019, 12:38 PM

## 2019-09-18 NOTE — Progress Notes (Signed)
This note also relates to the following rows which could not be included: Pulse Rate - Cannot attach notes to unvalidated device data Resp - Cannot attach notes to unvalidated device data BP - Cannot attach notes to unvalidated device data  Hd completed

## 2019-09-18 NOTE — Progress Notes (Signed)
PROGRESS NOTE    Barbara [name]  PFX:[id number] DOB: [birth year] DOA: 09/05/2019  PCP: [name], MD    LOS - 13   Brief Narrative:  Medical records reviewed and are as summarized below:  Barbara [name] is an 79 y.o. female medical history significant of hypertension, hyperlipidemia, diet-controlled diabetes, COPD, asthma, GERD, ESRD-HD (MWF), OSA, CAD, bladder cancer, iron deficiency anemia. She was admitted by Dr. Kizzie Bane for lefttotal hip arthroplasty. She developed acute respiratory distress after surgery. She was subsequently transferred to the ICU for acute hypoxemic and hypercapnic respiratory failure. Initially, she was treated with BiPAP. However, she failed BiPAP therapy and required intubation and mechanical ventilation in the ICU. She was also treated with IV antibiotics for aspiration pneumonia.  She was also seen by the nephrologist for end-stage renal disease. She was initially placed on CRRT and then transition to hemodialysis. She required vasopressors for hypotension.  She was seen by the neurologist for altered mental status and seizure activity. MRI brain did not show any acute stroke. Neurologist recommended Vimpat and monitoring for seizure  She was also seen by the cardiologist because of bradycardia and sinus pauses on telemetry. Per cardiologist, there was no indication for permanent pacemaker and he recommended a conservative approach.  She was extubated on 09/08/2019. Her condition was slowly improving so she was was transferred to the medical floor for continued medical management.  Subjective 3/1: Patient seen at bedside this morning.  No acute events reported overnight.  She she seems in better spirits this morning.  States her feet hurt.  Denies other complaints including chest pain or difficulty breathing or fevers or chills.  Assessment & Plan:   Principal Problem:   Status post total hip replacement, left Active Problems:   Anemia in  chronic kidney disease   COPD (chronic obstructive pulmonary disease) (HCC)   CAD in native artery   Benign essential HTN   Acid reflux   Seizure (HCC)   Type II diabetes mellitus with renal manifestations (HCC)   ESRD on dialysis ([place])   Aspiration pneumonia ([place])   Acute encephalopathy   Goals of care, counseling/discussion   Dysphagia: Patient removed her NG tube again and nursing staff could not replace it.  Discussed long-term feeding tube/PEG tube with her daughter and her husband.  Her daughter wanted the patient's husband to make the decision.  Her husband said he had thought about that and he would like for her to have a PEG tube.  Consulted gastroenterologist to evaluate the patient for possible PEG tube placement.  Aspiration pneumonia:  Initially treated with IVcefazolin, later on transition to IV Unasyn. Cefazolin was discontinued due to persistent encephalopathy. The patient apparently has another episode of aspiration on morning of 09/13/2019 with fever, so wastransitionedback to Zosyn. ContinueZosyn until 3/2  Acute toxic metabolic encephalopathy: History of seizures Improving. Continue Vimpat.  Chart review shows that patient has had similar episodes of encephalopathyin the past and requiredseveral days to recover. Continue seizure precaution. Appreciate neurology assistance. EEG unremarkable. Avoidcephalosporin.  Acute hypoxemic and hypercapnic respiratory failure: Resolved.  Extubated on 09/08/2019.  She iscurrently on room air. Patient was briefly on NRB while she was at HD on 09/13/2019 but that is likely from coiling of the NG tube.  ESRD on hemodialysis:  Follow-up with nephrologist for hemodialysis. Per neurology uremia can also contribute to patient's encephalopathy. Schedule days are Mondays, Wednesdays and Fridays.  Chronic diastolic CHF:  Hemodialysis for fluid management.  2D echo on 09/07/2019 showed  EF estimated at 60 to 65%,  grade 1 diastolic dysfunction.  Leukocytosis and thrombocytopenia: Stable.  Bradycardia: Improved  Suspected ileus:Resolved  MRSA colonization of nares: Completed Bactroban  Overall poor prognosis.Palliative care following.    DVT prophylaxis: Heparin   Code Status: DNR  Family Communication: None at bedside during encounter  Disposition Plan:  D/C to SNFpending improvement in dysphagia or long-term nutrition needs addressed (PEG needed?). Patient continues to require hospital care at this time due to inability to swallow safely, requiring tube feeding for nutrition.  Coming FromHome Exp DC DateTBD Barriersdysphagia with recurrent aspiration, requiring tube feeds Medically Stable for Discharge?No   Consultants:   Nephrology  Pulmonology  Neurology  Palliative care  [facility]  Gastroenterology   Antimicrobials:   Zosyn   Objective: Vitals:   09/17/19 0756 09/17/19 1658 09/17/19 2058 09/17/19 2352  BP: 138/65 (!) 135/38 124/66 (!) 142/56  Pulse: 75 71 75 72  Resp: 17 17 16 16   Temp: 98.2 F (36.8 C) 97.8 F (36.6 C) 99 F (37.2 C) 97.9 F (36.6 C)  TempSrc: Oral Oral Oral Oral  SpO2: 97% 99% 99% 100%  Weight:      Height:        Intake/Output Summary (Last 24 hours) at 09/18/2019 0720 Last data filed at 09/17/2019 1824 Gross per 24 hour  Intake 580.33 ml  Output -  Net 580.33 ml   Filed Weights   09/15/19 0500 09/16/19 0500 09/17/19 0550  Weight: 83.8 kg 82.3 kg 80.5 kg    Examination:  General exam: awake, alert, no acute distress, obese HEENT: moist mucus membranes, hearing grossly normal, poor dentition Respiratory system: CTAB on exam limited by patient speaking, no wheezes, rales or rhonchi, normal respiratory effort. Cardiovascular system: normal S1/S2, RRR, no pedal edema.   Gastrointestinal system: soft, NT, ND, +bowel sounds. Extremities: moves all, no edema, normal tone Skin: dry, intact, normal temperature, normal color  Psychiatry: normal mood, congruent affect   Data Reviewed: I have personally reviewed following labs and imaging studies  CBC: Recent Labs  Lab 09/14/19 0303 09/15/19 0433 09/16/19 0505 09/17/19 0452 09/18/19 0305  WBC 17.6* 19.7* 15.9* 13.7* 12.8*  NEUTROABS 14.0*  --   --   --   --   HGB 9.1* 8.6* 8.8* 8.5* 8.6*  HCT 29.9* 28.4* 29.4* 28.6* 29.1*  MCV 100.0 101.4* 102.4* 102.5* 102.5*  PLT 336 434* 428* 483* 983*   Basic Metabolic Panel: Recent Labs  Lab 09/13/19 1217 09/13/19 1217 09/14/19 0303 09/15/19 0433 09/16/19 0505 09/17/19 0452 09/18/19 0305  NA 141   < > 142 142 143 140 141  K 3.9   < > 3.8 3.8 3.6 3.6 4.0  CL 100   < > 99 101 98 100 99  CO2 25   < > 30 27 27 28 24   GLUCOSE 144*   < > 126* 126* 136* 112* 99  BUN 88*   < > 53* 81* 64* 85* 98*  CREATININE 7.46*   < > 5.56* 7.80* 6.30* 8.50* 10.04*  CALCIUM 8.2*   < > 8.5* 8.5* 8.8* 8.6* 8.6*  MG  --   --  2.1 2.5* 2.2 2.4 2.6*  PHOS 2.3*  --   --  4.0 4.3 5.8* 7.4*   < > = values in this interval not displayed.   GFR: Estimated Creatinine Clearance: 4.9 mL/min (A) (by C-G formula based on SCr of 10.04 mg/dL (H)). Liver Function Tests: Recent Labs  Lab 09/11/19 [phone] 09/13/19 [phone]  09/13/19 1217 09/14/19 0303  AST  --  50*  --  37  ALT  --  13  --  10  ALKPHOS  --  67  --  63  BILITOT  --  0.5  --  0.5  PROT  --  7.3  --  6.9  ALBUMIN 2.3* 2.2* 2.3* 2.1*   No results for input(s): LIPASE, AMYLASE in the last 168 hours. Recent Labs  Lab 09/12/19 1704  AMMONIA 24   Coagulation Profile: No results for input(s): INR, PROTIME in the last 168 hours. Cardiac Enzymes: No results for input(s): CKTOTAL, CKMB, CKMBINDEX, TROPONINI in the last 168 hours. BNP (last 3 results) No results for input(s): PROBNP in the last 8760 hours. HbA1C: No results for input(s): HGBA1C in the last 72 hours. CBG: Recent Labs  Lab 09/17/19 1159 09/17/19 1700 09/17/19 2027 09/17/19 2354 09/18/19 0412  GLUCAP  137* 96 104* 95 99   Lipid Profile: No results for input(s): CHOL, HDL, LDLCALC, TRIG, CHOLHDL, LDLDIRECT in the last 72 hours. Thyroid Function Tests: No results for input(s): TSH, T4TOTAL, FREET4, T3FREE, THYROIDAB in the last 72 hours. Anemia Panel: No results for input(s): VITAMINB12, FOLATE, FERRITIN, TIBC, IRON, RETICCTPCT in the last 72 hours. Sepsis Labs: Recent Labs  Lab 09/14/19 0303 09/15/19 0433  PROCALCITON 5.86 4.91    No results found for this or any previous visit (from the past 240 hour(s)).       Radiology Studies: DG Chest 1 View  Result Date: 09/17/2019 CLINICAL DATA:  Feeding tube placement EXAM: CHEST  1 VIEW COMPARISON:  09/17/2019 FINDINGS: 0402 hours. The cardio pericardial silhouette is enlarged. There is pulmonary vascular congestion without overt pulmonary edema. Interstitial markings are diffusely coarsened with chronic features. Prominent skin fold noted over the left hemithorax. Feeding tube is coiled in the esophagus with the tip of the feeding tube at the esophagogastric junction. Telemetry leads overlie the chest. IMPRESSION: A relatively large length of the feeding tube is coiled in the esophagus with the tip at the esophagogastric junction. Given the large amount of redundant tubing in the esophagus, this should be pulled back with repeat placement. These results will be called to the ordering clinician or representative by the Radiologist Assistant, and communication documented in the PACS or zVision Dashboard. Electronically Signed   By: Misty Stanley M.D.   On: 09/17/2019 04:26   DG Chest 1 View  Result Date: 09/17/2019 CLINICAL DATA:  Enteric catheter placement, patient pulled on NG tube EXAM: CHEST  1 VIEW COMPARISON:  09/17/2019 FINDINGS: Single frontal view of the chest demonstrates a weighted tip of an enteric feeding catheter overlying the gastroesophageal junction. Cardiac silhouette is stable. Chronic central vascular congestion and diffuse  interstitial prominence unchanged. Stable patchy consolidation at the lung bases. No effusion or pneumothorax. Vascular stent unchanged. IMPRESSION: 1. Enteric catheter overlying gastroesophageal junction. 2. Stable vascular congestion and bibasilar consolidation. Electronically Signed   By: Randa Ngo M.D.   On: 09/17/2019 03:09   DG Chest 1 View  Result Date: 09/17/2019 CLINICAL DATA:  Enteric catheter placement, previous abnormal chest x-ray EXAM: CHEST  1 VIEW COMPARISON:  09/13/2019 FINDINGS: Frontal view of the chest was obtained, with the patient markedly rotated to the right. Weighted tip of an enteric catheter overlies the distal thoracic esophagus. The tip of the catheter is at least 4 cm from the gastroesophageal junction. The cardiac silhouette is enlarged but stable. There is persistent central vascular congestion. Patchy consolidation at the lung  bases slightly improved. Evaluation of the right lung base limited due to patient rotation. No large effusion or pneumothorax. Stable left-sided vascular stent. IMPRESSION: 1. Enteric catheter as above. Recommended advancing at least 4 cm to ensure intragastric placement. 2. Central vascular congestion and patchy bibasilar consolidation, slightly improved since prior study. Evaluation limited by positioning. Electronically Signed   By: Randa Ngo M.D.   On: 09/17/2019 00:30        Scheduled Meds: . B-complex with vitamin C  1 tablet Per Tube QHS  . chlorhexidine  15 mL Mouth/Throat QID  . epoetin (EPOGEN/PROCRIT) injection  10,000 Units Intravenous Q M,W,F-HD  . feeding supplement (NEPRO CARB STEADY)  1,000 mL Oral Q24H  . feeding supplement (PRO-STAT SUGAR FREE 64)  30 mL Per Tube BID  . free water  50 mL Per Tube Q4H  . heparin injection (subcutaneous)  5,000 Units Subcutaneous Q12H  . mouth rinse  15 mL Mouth Rinse BID   Continuous Infusions: . sodium chloride    . famotidine (PEPCID) IV 20 mg (09/17/19 1752)  . lacosamide  (VIMPAT) IV 100 mg (09/17/19 2147)  . piperacillin-tazobactam (ZOSYN)  IV 3.375 g (09/18/19 0300)     LOS: 13 days    Time spent: 30 minutes    Ezekiel Slocumb, DO [facility]   If 7PM-7AM, please contact night-coverage [email or web address] 09/18/2019, 7:20 AM

## 2019-09-18 NOTE — Consult Note (Addendum)
Pharmacy Antibiotic Note  Barbara [name] is a 79 y.o. female admitted on 09/05/2019 with aspiration pneumonia. Patient in hospital for left total hip arthroplasty and subsequently transferred to ICU on 2/58 for metabolic acidosis, encephalopathy, and severe respiratory distress requiring intubation. She was extubated 2/19  She has a history of ESRD on HD MWF. Pharmacy has been consulted for pip/tazo dosing for aspiration pneumonia..   Plan: Per [facility] Zosyn Extended Dosing Protocol, continue  Pip/Tazo 3.375 g IV q12H EI due to HD renal dosing.    Height: 5' 6"  (167.6 cm) Weight: 184 lb 4.9 oz (83.6 kg) IBW/kg (Calculated) : 59.3  Temp (24hrs), Avg:98 F (36.7 C), Min:97.5 F (36.4 C), Max:99 F (37.2 C)  Recent Labs  Lab 09/14/19 0303 09/15/19 0433 09/16/19 0505 09/17/19 0452 09/18/19 0305  WBC 17.6* 19.7* 15.9* 13.7* 12.8*  CREATININE 5.56* 7.80* 6.30* 8.50* 10.04*    Estimated Creatinine Clearance: 4.9 mL/min (A) (by C-G formula based on SCr of 10.04 mg/dL (H)).    Allergies  Allergen Reactions  . Glipizide Er Other (See Comments)    Hypoglycemia   . Percocet [Oxycodone-Acetaminophen] Hives    Antimicrobials this admission: Cefazolin for surgical prophylaxis Ampicillin/sulbactam 2/20 >> 2/24 Pip/tazo 2/24 >>  Microbiology results: 2/17 MRSA PCR: Positive  Thank you for allowing pharmacy to be a part of this patient's care.  Rocky Morel, PharmD, BCPS Clinical Pharmacist 09/18/2019 12:05 PM

## 2019-09-18 NOTE — Progress Notes (Signed)
OT Cancellation Note  Patient Details Name: Barbara [name] MRN: [id number] DOB: 02/18/1941   Cancelled Treatment:    Reason Eval/Treat Not Completed: Patient at procedure or test/ unavailable  Pt off floor at HD at this time. Will f/u for OT treatment as able/as pt becomes available. Thank you.  Gerrianne Scale, [place], OTR/L ascom [phone] 09/18/19, 1:15 PM

## 2019-09-19 DIAGNOSIS — R1310 Dysphagia, unspecified: Secondary | ICD-10-CM

## 2019-09-19 LAB — CBC
HCT: 34.4 % — ABNORMAL LOW (ref 36.0–46.0)
Hemoglobin: 10.1 g/dL — ABNORMAL LOW (ref 12.0–15.0)
MCH: 30.3 pg (ref 26.0–34.0)
MCHC: 29.4 g/dL — ABNORMAL LOW (ref 30.0–36.0)
MCV: 103.3 fL — ABNORMAL HIGH (ref 80.0–100.0)
Platelets: 429 10*3/uL — ABNORMAL HIGH (ref 150–400)
RBC: 3.33 MIL/uL — ABNORMAL LOW (ref 3.87–5.11)
RDW: 14.4 % (ref 11.5–15.5)
WBC: 10.7 10*3/uL — ABNORMAL HIGH (ref 4.0–10.5)
nRBC: 0.2 % (ref 0.0–0.2)

## 2019-09-19 LAB — BASIC METABOLIC PANEL
Anion gap: 18 — ABNORMAL HIGH (ref 5–15)
BUN: 47 mg/dL — ABNORMAL HIGH (ref 8–23)
CO2: 21 mmol/L — ABNORMAL LOW (ref 22–32)
Calcium: 8.5 mg/dL — ABNORMAL LOW (ref 8.9–10.3)
Chloride: 101 mmol/L (ref 98–111)
Creatinine, Ser: 6.49 mg/dL — ABNORMAL HIGH (ref 0.44–1.00)
GFR calc Af Amer: 6 mL/min — ABNORMAL LOW (ref >60)
GFR calc non Af Amer: 6 mL/min — ABNORMAL LOW (ref >60)
Glucose, Bld: 88 mg/dL (ref 70–99)
Potassium: 4.7 mmol/L (ref 3.5–5.1)
Sodium: 140 mmol/L (ref 135–145)

## 2019-09-19 LAB — GLUCOSE, CAPILLARY
Glucose-Capillary: 107 mg/dL — ABNORMAL HIGH (ref 70–99)
Glucose-Capillary: 96 mg/dL (ref 70–99)
Glucose-Capillary: 95 mg/dL (ref 70–99)
Glucose-Capillary: 115 mg/dL — ABNORMAL HIGH (ref 70–99)
Glucose-Capillary: 129 mg/dL — ABNORMAL HIGH (ref 70–99)
Glucose-Capillary: 121 mg/dL — ABNORMAL HIGH (ref 70–99)

## 2019-09-19 LAB — PHOSPHORUS: Phosphorus: 5.8 mg/dL — ABNORMAL HIGH (ref 2.5–4.6)

## 2019-09-19 LAB — MAGNESIUM: Magnesium: 2.2 mg/dL (ref 1.7–2.4)

## 2019-09-19 MED ORDER — LOPERAMIDE HCL 1 MG/7.5ML PO SUSP
4.0000 mg | Freq: Once | ORAL | Status: DC
  Filled 2019-09-19: qty 30

## 2019-09-19 NOTE — Progress Notes (Signed)
Subjective: 14 Days Post-Op Procedure(s) (LRB): TOTAL HIP ARTHROPLASTY (Left) Patient is well this morning.  Patient underwent dialysis yesterday. No pain to the left hip. Awake but not alert this morning.  Objective: Vital signs in last 24 hours: Temp:  [97.7 F (36.5 C)-99 F (37.2 C)] 98.4 F (36.9 C) (03/02 0749) Pulse Rate:  [51-81] 79 (03/02 0749) Resp:  [14-26] 19 (03/02 0749) BP: (86-157)/(48-78) 153/57 (03/02 0749) SpO2:  [99 %-100 %] 100 % (03/02 0749) Weight:  [83.6 kg] 83.6 kg (03/01 1055)  Intake/Output from previous day:  Intake/Output Summary (Last 24 hours) at 09/19/2019 0807 Last data filed at 09/19/2019 0600 Gross per 24 hour  Intake 421.25 ml  Output 1500 ml  Net -1078.75 ml    Intake/Output this shift: No intake/output data recorded.  Labs: Recent Labs    09/17/19 0452 09/18/19 0305  HGB 8.5* 8.6*   Recent Labs    09/17/19 0452 09/18/19 0305  WBC 13.7* 12.8*  RBC 2.79* 2.84*  HCT 28.6* 29.1*  PLT 483* 527*   Recent Labs    09/17/19 0452 09/18/19 0305  NA 140 141  K 3.6 4.0  CL 100 99  CO2 28 24  BUN 85* 98*  CREATININE 8.50* 10.04*  GLUCOSE 112* 99  CALCIUM 8.6* 8.6*   No results for input(s): LABPT, INR in the last 72 hours.   EXAM General - Patient is Alert, answering basic questions this AM. Steri-strips intact to the left hip, no drainage. Patient is moving toes and feet this AM. Negative [place]oman to the left leg.  Past Medical History:  Diagnosis Date  . Adult celiac disease   . Anemia associated with chronic renal failure 2017   blood transfusion last week 10/17  . Arthritis   . Asthma   . Cancer ([place]) 2017   bladder  . Chronic kidney disease   . 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  dr. Candiss Norse and dr. Holley Raring follow her  . COPD (chronic obstructive pulmonary disease) ([place])   . Coronary artery disease   . Diabetes mellitus without complication ([place])   . Dialysis patient ([place])    Tues, Thurs, Sat   . Dyspnea    with exertion  . Elevated lipids   . GERD (gastroesophageal reflux disease)   . Hematuria   . Hypertension   . Lower back pain   . Neuropathy   . Oxygen dependent    at hs  . Personal history of chemotherapy 2017   bladder ca  . Personal history of radiation therapy 2017   bladder ca  . Sleep apnea    uses cpap  . Urinary obstruction 01/2016   Assessment/Plan: 14 Days Post-Op Procedure(s) (LRB): TOTAL HIP ARTHROPLASTY (Left) Principal Problem:   Status post total hip replacement, left Active Problems:   Anemia in chronic kidney disease   COPD (chronic obstructive pulmonary disease) (HCC)   CAD in native artery   Benign essential HTN   Acid reflux   Seizure (HCC)   Type II diabetes mellitus with renal manifestations (HCC)   ESRD on dialysis ([place])   Aspiration pneumonia ([place])   Acute encephalopathy   Goals of care, counseling/discussion  Estimated body mass index is 29.75 kg/m as calculated from the following:   Height as of this encounter: 5' 6"  (1.676 m).   Weight as of this encounter: 83.6 kg.  Continue with therapy. Continue to monitor progress. Will need SNF upon discharge.   Raquel Hyland Mollenkopf,  PA-C [facility] Orthopaedic Surgery 09/19/2019, 8:07 AM

## 2019-09-19 NOTE — Progress Notes (Addendum)
Speech Language Pathology Treatment: Dysphagia  Patient Details Name: Barbara [name] MRN: [id number] DOB: [birth year] Today's Date: 09/19/2019 Time: 4098-1191 SLP Time Calculation (min) (ACUTE ONLY): 27 min  Assessment / Plan / Recommendation Clinical Impression  Pt seen for ongoing assessment of swallowing; attempts at po trials in order to upgrade diet consistency. Pt has been on a clear liquid diet (Nectar consistency d/t Cognitive decline/Confusion and risk for aspiration; NG in place prior). Pt recently pulled out the [place], and MD requested consult w/ GI for PEG placement per family request. MD requested 1 more attempt at oral intake w/ pt w/ Dobhoff out. Upon arriving at room, pt was agitated during NA bathing/care.  After care, SLP allowed pt to rest briefly then returned to present po trials of ice chips, Nectar liquids, and purees(oatmeal, yogurt, pudding). Pt was positioned upright for trials; attempted oral care which pt was not interested in having done. Noted agitation in verbal responses. Then presented single ice chip trials which pt expectorated the bolus -- she would Not accept further ice chips w/ SLP's encouragement. Offered her trials of the foods, Nectar liquids via tsp/straw. Pt again REFUSED all po trial attempts. SLP asked for NA to attempt as well, but pt continued to REFUSE po's presented. With increased verbal agitation, No further trials attempted. Pt verbally endorsed not having eaten in "2 days" but when presented w/ ice chips, liquids, and foods, the ademently Refused all attempts and wanted to be left alone. She would Not help to hold cup/spoon w/ R hand for eating/drinking. There was no sense in agitating pt further at the time. NSG was asked to try later then chart results to MD as well.  Consulted MD and suggested leaving pt on the dysphagia 1 w/ Nectar liquids diet w/ aspiration precautions and feeding support at meals; Supervision. Maybe w/ the Husband present later at  [facility] meal, pt can be encouraged to take po's at the meal. Aspiration precautions posted in room, chart. NSG updated. ST services will f/u at Lunch meal for any further education, needs. Recommend frequent oral care for hygiene and stimulation of swallowing. Due to pt's Cognitive decline/Confusion and Agitation, she is at increased risk for aspiration as well as being unable to meet her nutrition/hydration needs adequately, safely. Recommend f/u w/ Palliative Care for [place], GI for discussion of long term alternative means of feeding for nutrition.  Addendum at 1015AM: met w/ pt and Husband who had arrived. Encouraged Husband's to help this SLP entice pt to take bites/sips. After Husband gave the first 2 tsps, SLP continued to give further. Pt consumed ~8 tsp trials of Puree foods and ~3 ozs of Nectar liquids via Straw w/ NO overt s/s of aspiration noted; no immediate coughing, wet vocal quality, or decline in respiratory status. Verbal cues given to pt to NOT talk w/ food in mouth and to lingual sweep to clear oral cavity b/t bites; SLP alternated w/ sips of Nectar liquids. Pt quickly declined further po's despite encouragement, but this was a beginning. Requested NSG to f/u w/ pt/Husband at [facility] meal for encouragement of continued bites/sips at that meal. MD updated.     HPI HPI: Pt is an 79 y.o. female with medical history significant of hypertension, hyperlipidemia, diet-controlled diabetes, COPD, asthma, GERD, ESRD-HD (MWF), OSA, CAD, bladder cancer, iron deficiency anemia, pt is admitted by Dr. Kizzie Bane for s/p of left total hip arthroplasty.  During this admission, Transferred to ICU for severe resp distress, severe metabolic acidosis and encephalopathy.  She failed BiPAP and was emergently orally intubated on 09/06/19-09/08/2019 post seizure-like activity noted.       SLP Plan  Continue with current plan of care       Recommendations  Diet recommendations: Dysphagia 1 (puree);Nectar-thick  liquid Liquids provided via: Cup;Straw;Teaspoon Medication Administration: Crushed with puree(for safer swallowing) Supervision: Staff to assist with self feeding;Full supervision/cueing for compensatory strategies Compensations: Minimize environmental distractions;Slow rate;Small sips/bites;Lingual sweep for clearance of pocketing;Multiple dry swallows after each bite/sip;Follow solids with liquid Postural Changes and/or Swallow Maneuvers: Seated upright 90 degrees;Upright 30-60 min after meal                General recommendations: (Palliative Care consult) Oral Care Recommendations: Oral care BID;Oral care before and after PO;Staff/trained caregiver to provide oral care Follow up Recommendations: Skilled Nursing facility SLP Visit Diagnosis: Dysphagia, oropharyngeal phase (R13.12);Other (comment)(Cognitive decline) Plan: Continue with current plan of care       [place], [place], CCC-SLP , 09/19/2019, 9:37 AM

## 2019-09-19 NOTE — Progress Notes (Signed)
Physical Therapy Treatment Patient Details Name: Barbara [name] MRN: [id number] DOB: 13-Jan-1941 Today's Date: 09/19/2019    History of Present Illness Per MD:  "79 y.o. female with medical history significant of hypertension, hyperlipidemia, diet-controlled diabetes, COPD, asthma, GERD, ESRD-HD (MWF), OSA, CAD, bladder cancer, iron deficiency anemia.  She was admitted by Dr. Roland Rack for left total hip arthroplasty.  She developed acute respiratory distress after surgery.  She was subsequently transferred to the ICU for acute hypoxemic and hypercapnic respiratory failure.  Initially, she was treated with BiPAP.  However, she failed BiPAP therapy and required intubation and mechanical ventilation in the ICU.  She was also treated with IV antibiotics for aspiration pneumonia." Pt also recieved CRRT and transitioned to HD during this stay, as well as vasopressors for hypotension, MRI performed due to AMS and seizure like activity, MRI negative for acute findings. Cardiology consulted for bradycardia and sinus pauses, no indication for pacemaker, conservative approach. Pt extubated 09/08/2019 and transferred to medical floor.    PT Comments    Pt resting in bed upon PT arrival.  Able to perform limited PROM ex's B LE's (pt would not initiate to assist with ex's with max encouragement).  Therapist attempted to engage pt with therapy session but session limited d/t pt becoming resistant to sessions activities and refusing to participate (nurse notified).  Will continue to attempt with strengthening, ROM, and progressive functional mobility as able.    Follow Up Recommendations  SNF     Equipment Recommendations  Rolling walker with 5" wheels;3in1 (PT);Wheelchair (measurements PT);Wheelchair cushion (measurements PT);Hospital bed    Recommendations for Other Services OT consult     Precautions / Restrictions Precautions Precautions: Posterior Hip;Fall Precaution Booklet Issued: Yes (comment) Precaution  Comments: mits; aspiration Restrictions Weight Bearing Restrictions: Yes LLE Weight Bearing: Weight bearing as tolerated    Mobility  Bed Mobility               General bed mobility comments: Pt refusing any repositioning in bed  Transfers                    Ambulation/Gait                 Stairs             Wheelchair Mobility    Modified Rankin (Stroke Patients Only)       Balance                                            Cognition Arousal/Alertness: Awake/alert Behavior During Therapy: Flat affect Overall Cognitive Status: Impaired/Different from baseline                                 General Comments: Oriented to name (did not answer when asked DOB); generalized confusion noted but other times pt's conversation appearing appropriate to situation      Exercises Total Joint Exercises Short Arc Quad: PROM;Both;10 reps;Supine Heel Slides: PROM;Both;10 reps;Supine    General Comments   Nursing cleared pt for participation in physical therapy.  L UE mit donned.       Pertinent Vitals/Pain Pain Assessment: Faces Faces Pain Scale: Hurts a little bit Pain Location: L hip discomfort with movement Pain Descriptors / Indicators: Guarding Pain Intervention(s): Limited activity within patient's tolerance;Monitored during  session;Repositioned     Home Living                      Prior Function            PT Goals (current goals can now be found in the care plan section) Acute Rehab PT Goals Patient Stated Goal: unable to state PT Goal Formulation: With patient Time For Goal Achievement: 09/28/19 Potential to Achieve Goals: Fair Progress towards PT goals: Not progressing toward goals - comment(unable to advance functional mobility)    Frequency    Min 2X/week      PT Plan Current plan remains appropriate    Co-evaluation              AM-PAC PT "6 Clicks" Mobility    Outcome Measure  Help needed turning from your back to your side while in a flat bed without using bedrails?: Total Help needed moving from lying on your back to sitting on the side of a flat bed without using bedrails?: Total Help needed moving to and from a bed to a chair (including a wheelchair)?: Total Help needed standing up from a chair using your arms (e.g., wheelchair or bedside chair)?: Total Help needed to walk in hospital room?: Total Help needed climbing 3-5 steps with a railing? : Total 6 Click Score: 6    End of Session Equipment Utilized During Treatment: Oxygen Activity Tolerance: (pt initially guarding with L hip movement) Patient left: in bed;with call bell/phone within reach;with bed alarm set;with SCD's reapplied;Other (comment)(B heels floating via pillow) Nurse Communication: Mobility status;Precautions PT Visit Diagnosis: Muscle weakness (generalized) (M62.81);Difficulty in walking, not elsewhere classified (R26.2);Other abnormalities of gait and mobility (R26.89);Pain Pain - Right/Left: Left Pain - part of body: Hip     Time: 9485-4627 PT Time Calculation (min) (ACUTE ONLY): 13 min  Charges:  $Therapeutic Exercise: 8-22 mins                     Leitha Bleak, PT 09/19/19, 12:53 PM

## 2019-09-19 NOTE — Progress Notes (Addendum)
PROGRESS NOTE    [name]  ESL:[id number] DOB: 07/07/1941 DOA: 09/05/2019  PCP: [name], MD    LOS - 14   Brief Narrative:  Barbara L Conyersis an 79 y.o.femalemedical history significant of hypertension, hyperlipidemia, diet-controlled diabetes, COPD, asthma, GERD, ESRD-HD (MWF), OSA, CAD, bladder cancer, iron deficiency anemia. She was admitted by Dr. Kizzie Bane for lefttotal hip arthroplasty. She developed acute respiratory distress after surgery. She was subsequently transferred to the ICU for acute hypoxemic and hypercapnic respiratory failure. Initially, she was treated with BiPAP. However, she failed BiPAP therapy and required intubation and mechanical ventilation in the ICU. She was also treated with IV antibiotics for aspiration pneumonia.  She was also seen by the nephrologist for end-stage renal disease. She was initially placed on CRRT and then transition to hemodialysis. She required vasopressors for hypotension.  She was seen by the neurologist for altered mental status and seizure activity. MRI brain did not show any acute stroke. Neurologist recommended Vimpat and monitoring for seizure  She was also seen by the cardiologist because of bradycardia and sinus pauses on telemetry. Per cardiologist, there was no indication for permanent pacemaker and he recommended a conservative approach.  She was extubated on 09/08/2019. Her condition was slowly improving so she was was transferred to the medical floor for continued medical management.  Subjective 3/2: Patient seen this morning with husband at bedside.  Speech pathologist attempted to reevaluate patient swallow this morning, but patient was agitated and refusing.  During my encounter, we attempted to feed her oatmeal and she refused again.  Discussed plan for PEG to meet patient's nutritional needs with patient's husband.  He has been hesitant regarding this because of patient's prolonged confusion following  anesthesia, however was agreeable once we discussed her need for nutrition in order to continue to heal and improve clinically.  Assessment & Plan:   Principal Problem:   Dysphagia Active Problems:   Anemia in chronic kidney disease   COPD (chronic obstructive pulmonary disease) (HCC)   CAD in native artery   Benign essential HTN   Acid reflux   Seizure (HCC)   Status post total hip replacement, left   Type II diabetes mellitus with renal manifestations (HCC)   ESRD on dialysis ([place])   Aspiration pneumonia ([place])   Acute encephalopathy   Goals of care, counseling/discussion   Dysphagia: Patient removed her NG tube again and nursing staff could not replace it. Discussed long-term feeding tube/PEG tube with her daughter and her husband. Husband is in agreement with PEG tube placement, understanding need for nutrition. Patient refused re-evaluation of her swallow today. --GI consulted, plan for PEG placement tomorrow 3/3  Aspiration pneumonia:  Initially treated with IVcefazolin, later on transition to IV Unasyn. The patient had recurrent episode of aspiration on of 09/13/2019 with fever, wastransitionedback to Zosyn. ContinueZosyn until 3/2 - completed.    Acute toxic metabolic encephalopathy: Improving - slowly.  Waxes and wanes, usually worse after dialysis.  Chart review shows that patient has had similar episodes of encephalopathyin the past and requiredseveral days to recover. --History of seizures - Continue Vimpat.  --Continue seizure precaution. --Appreciate neurology assistance. EEG unremarkable. --Avoidcephalosporins.  Acute hypoxemic and hypercapnic respiratory failure: Resolved.  Extubated on 09/08/2019.  She iscurrently on room air. Patient was briefly on NRB while she was at HD on 09/13/2019 but that is likely from coiling of the NG tube.  ESRD on hemodialysis:  Follow-up with nephrologist for hemodialysis. Per neurology uremia can also  contribute  to patient's encephalopathy. Schedule days are Mondays, Wednesdays and Fridays.  Chronic diastolic CHF:  Hemodialysis for fluid management.  2D echo on 09/07/2019 showed EF estimated at 60 to 18%, grade 1 diastolic dysfunction.  Leukocytosis and thrombocytopenia: Stable.  Bradycardia: Improved  Suspected ileus:Resolved  MRSA colonization of nares: Completed Bactroban  Overall poor prognosis.Palliative care following.    DVT prophylaxis: Heparin   Code Status: DNR  Family Communication: None at bedside during encounter  Disposition Plan:  D/C to SNFpending improvement in dysphagia or long-term nutrition needs addressed (PEG needed?). Patient continues to require hospital care at this time due to inability to swallow safely, requiring tube feeding for nutrition.  Coming FromHome Exp DC DateTBD Barriersdysphagia with recurrent aspiration, requiring tube feeds Medically Stable for Discharge?No   Consultants:   Nephrology  Pulmonology  Neurology  Palliative care  [facility]  Gastroenterology   Antimicrobials:   Zosyn    Objective: Vitals:   09/18/19 1430 09/18/19 1533 09/18/19 2320 09/19/19 0749  BP: (!) 142/63 (!) 135/48 (!) 157/55 (!) 153/57  Pulse: (!) 51 76 77 79  Resp: 14  (!) 22 19  Temp: 98.1 F (36.7 C) 97.7 F (36.5 C) 99 F (37.2 C) 98.4 F (36.9 C)  TempSrc: Oral Oral Oral Oral  SpO2: 100% 100% 99% 100%  Weight:      Height:        Intake/Output Summary (Last 24 hours) at 09/19/2019 1126 Last data filed at 09/19/2019 0600 Gross per 24 hour  Intake 356.02 ml  Output 1500 ml  Net -1143.98 ml   Filed Weights   09/16/19 0500 09/17/19 0550 09/18/19 1055  Weight: 82.3 kg 80.5 kg 83.6 kg    Examination:  General exam: awake, alert, no acute distress, obese Respiratory system: decreased breath sounds but clear bilaterally, no upper airway secretion sounds, no wheezes, rales or rhonchi, normal respiratory  effort. Cardiovascular system: normal S1/S2, RRR, no pedal edema.   Gastrointestinal system: soft, NT, ND, no HSM felt, +bowel sounds. Psychiatry: irritable mood, congruent affect, abnormal judgement and insight     Data Reviewed: I have personally reviewed following labs and imaging studies  CBC: Recent Labs  Lab 09/14/19 0303 09/14/19 0303 09/15/19 0433 09/16/19 0505 09/17/19 0452 09/18/19 0305 09/19/19 0939  WBC 17.6*   < > 19.7* 15.9* 13.7* 12.8* 10.7*  NEUTROABS 14.0*  --   --   --   --   --   --   HGB 9.1*   < > 8.6* 8.8* 8.5* 8.6* 10.1*  HCT 29.9*   < > 28.4* 29.4* 28.6* 29.1* 34.4*  MCV 100.0   < > 101.4* 102.4* 102.5* 102.5* 103.3*  PLT 336   < > 434* 428* 483* 527* 429*   < > = values in this interval not displayed.   Basic Metabolic Panel: Recent Labs  Lab 09/15/19 0433 09/16/19 0505 09/17/19 0452 09/18/19 0305 09/19/19 0939  NA 142 143 140 141 140  K 3.8 3.6 3.6 4.0 4.7  CL 101 98 100 99 101  CO2 27 27 28 24  21*  GLUCOSE 126* 136* 112* 99 88  BUN 81* 64* 85* 98* 47*  CREATININE 7.80* 6.30* 8.50* 10.04* 6.49*  CALCIUM 8.5* 8.8* 8.6* 8.6* 8.5*  MG 2.5* 2.2 2.4 2.6* 2.2  PHOS 4.0 4.3 5.8* 7.4* 5.8*   GFR: Estimated Creatinine Clearance: 7.7 mL/min (A) (by C-G formula based on SCr of 6.49 mg/dL (H)). Liver Function Tests: Recent Labs  Lab 09/13/19 0737 09/13/19 1217 09/14/19  0303  AST 50*  --  37  ALT 13  --  10  ALKPHOS 67  --  63  BILITOT 0.5  --  0.5  PROT 7.3  --  6.9  ALBUMIN 2.2* 2.3* 2.1*   No results for input(s): LIPASE, AMYLASE in the last 168 hours. Recent Labs  Lab 09/12/19 1704  AMMONIA 24   Coagulation Profile: No results for input(s): INR, PROTIME in the last 168 hours. Cardiac Enzymes: No results for input(s): CKTOTAL, CKMB, CKMBINDEX, TROPONINI in the last 168 hours. BNP (last 3 results) No results for input(s): PROBNP in the last 8760 hours. HbA1C: No results for input(s): HGBA1C in the last 72 hours. CBG: Recent  Labs  Lab 09/18/19 1623 09/18/19 1959 09/19/19 0136 09/19/19 0431 09/19/19 0757  GLUCAP 90 81 107* 96 95   Lipid Profile: No results for input(s): CHOL, HDL, LDLCALC, TRIG, CHOLHDL, LDLDIRECT in the last 72 hours. Thyroid Function Tests: No results for input(s): TSH, T4TOTAL, FREET4, T3FREE, THYROIDAB in the last 72 hours. Anemia Panel: No results for input(s): VITAMINB12, FOLATE, FERRITIN, TIBC, IRON, RETICCTPCT in the last 72 hours. Sepsis Labs: Recent Labs  Lab 09/14/19 0303 09/15/19 0433  PROCALCITON 5.86 4.91    No results found for this or any previous visit (from the past 240 hour(s)).       Radiology Studies: No results found.      Scheduled Meds: . B-complex with vitamin C  1 tablet Per Tube QHS  . chlorhexidine  15 mL Mouth/Throat QID  . epoetin (EPOGEN/PROCRIT) injection  10,000 Units Intravenous Q M,W,F-HD  . feeding supplement (NEPRO CARB STEADY)  1,000 mL Oral Q24H  . feeding supplement (PRO-STAT SUGAR FREE 64)  30 mL Per Tube BID  . free water  50 mL Per Tube Q4H  . heparin injection (subcutaneous)  5,000 Units Subcutaneous Q12H  . mouth rinse  15 mL Mouth Rinse BID   Continuous Infusions: . sodium chloride    . dextrose Stopped (09/19/19 0211)  . famotidine (PEPCID) IV Stopped (09/18/19 2115)  . lacosamide (VIMPAT) IV 100 mg (09/19/19 1111)  . piperacillin-tazobactam (ZOSYN)  IV 12.5 mL/hr at 09/19/19 0600     LOS: 14 days    Time spent: 20 minutes    Ezekiel Slocumb, DO [facility]   If 7PM-7AM, please contact night-coverage [email or web address] 09/19/2019, 11:26 AM

## 2019-09-19 NOTE — H&P (View-Only) (Signed)
GI Inpatient Follow-up Note  Subjective:  Patient seen in follow-up for dysphagia, aspiration, and encephalopathy. Husband present at bedside this afternoon. No acute overnight events. Patient refused speech therapy evaluation today as she was agitated and refused. Hospitalist attempted to feed oatmeal and she refused. Husband has questions about the PEG as he is hesitant due to her issues with prolonged confusion after anesthesia.   Scheduled Inpatient Medications:  . B-complex with vitamin C  1 tablet Per Tube QHS  . chlorhexidine  15 mL Mouth/Throat QID  . epoetin (EPOGEN/PROCRIT) injection  10,000 Units Intravenous Q M,W,F-HD  . feeding supplement (NEPRO CARB STEADY)  1,000 mL Oral Q24H  . feeding supplement (PRO-STAT SUGAR FREE 64)  30 mL Per Tube BID  . free water  50 mL Per Tube Q4H  . heparin injection (subcutaneous)  5,000 Units Subcutaneous Q12H  . mouth rinse  15 mL Mouth Rinse BID    Continuous Inpatient Infusions:   . sodium chloride    . dextrose Stopped (09/19/19 0211)  . famotidine (PEPCID) IV Stopped (09/18/19 2115)  . lacosamide (VIMPAT) IV 100 mg (09/19/19 1111)  . piperacillin-tazobactam (ZOSYN)  IV 12.5 mL/hr at 09/19/19 0600    PRN Inpatient Medications:  acetaminophen (TYLENOL) oral liquid 160 mg/5 mL, acetaminophen, acetaminophen, bisacodyl, hydrALAZINE, ipratropium-albuterol, ondansetron **OR** ondansetron (ZOFRAN) IV  Review of Systems:  Unable to obtain due to patient's mental status    Physical Examination: BP (!) 153/57 (BP Location: Right Arm)   Pulse 79   Temp 98.4 F (36.9 C) (Oral)   Resp 19   Ht 5' 6"  (1.676 m)   Wt 83.6 kg   SpO2 100%   BMI 29.75 kg/m  Gen: NAD, alert and oriented to self and place HEENT: PEERLA, EOMI, Neck: supple, no JVD or thyromegaly Chest: CTA bilaterally, no wheezes, crackles, or other adventitious sounds CV: RRR, no m/g/c/r Abd: soft, NT, ND, +BS in all four quadrants; no HSM, guarding, ridigity, or rebound  tenderness Ext: no edema, well perfused with 2+ pulses, Skin: no rash or lesions noted Lymph: no LAD  Data: Lab Results  Component Value Date   WBC 10.7 (H) 09/19/2019   HGB 10.1 (L) 09/19/2019   HCT 34.4 (L) 09/19/2019   MCV 103.3 (H) 09/19/2019   PLT 429 (H) 09/19/2019   Recent Labs  Lab 09/17/19 0452 09/18/19 0305 09/19/19 0939  HGB 8.5* 8.6* 10.1*   Lab Results  Component Value Date   NA 140 09/19/2019   K 4.7 09/19/2019   CL 101 09/19/2019   CO2 21 (L) 09/19/2019   BUN 47 (H) 09/19/2019   CREATININE 6.49 (H) 09/19/2019   Lab Results  Component Value Date   ALT 10 09/14/2019   AST 37 09/14/2019   ALKPHOS 63 09/14/2019   BILITOT 0.5 09/14/2019   No results for input(s): APTT, INR, PTT in the last 168 hours. Assessment/Plan:  79 y/o AA female with a PMH of HTN, HLD, diet-controlled diabetes, COPD, asthma, GERD, ESRD-HD (MWF), OSA, CAD, Hx of bladder cancer, and IDA admitted for total hip arthroplasty complicated by acute hypoxic respiratory failure s/p intubation and mechanical ventilation  1. Dysphagia 2. Aspiration pneumonia 3. Acute toxic metabolic encephalopathy 4. Multiple medical comorbidities 5. DNR Status    -I had long discussion with patient and husband about the indications of PEG tube placement and it's ability to enable her to receive medications, nutrition, and feedings directly through her stomach. We discussed PEG does not change prognosis and there is  still risk of her pulling PEG tube out -Speech therapy attempted to evaluate pt today and she became agitated and refused -Husband consents for PEG tube placement. -Plan for PEG tomorrow with Dr. Alice Reichert -She is on IV Zosyn for aspiration pneumonia. Continue.  -See procedure note for further details -I have discussed timing of PEG with her nephrologist (Dr. Candiss Norse) who reports that PEG can be placed tomorrow and HD can be done after or pushed back to Thursday   I reviewed the risks (including  bleeding, perforation, infection, anesthesia complications, cardiac/respiratory complications), benefits and alternatives of PEG Tube placement. Patient consents to proceed.    Please call with questions or concerns.    Octavia Bruckner, PA-C [facility] [phone] (Cell)

## 2019-09-19 NOTE — Plan of Care (Signed)
  Problem: Education: Goal: Knowledge of General Education information will improve Description: Including pain rating scale, medication(s)/side effects and non-pharmacologic comfort measures 09/19/2019 1608 by Rowe Robert, RN Outcome: Progressing 09/19/2019 1607 by Rowe Robert, RN Outcome: Progressing   Problem: Health Behavior/Discharge Planning: Goal: Ability to manage health-related needs will improve 09/19/2019 1608 by Rowe Robert, RN Outcome: Progressing 09/19/2019 1607 by Rowe Robert, RN Outcome: Progressing   Problem: Clinical Measurements: Goal: Ability to maintain clinical measurements within normal limits will improve 09/19/2019 1608 by Rowe Robert, RN Outcome: Progressing 09/19/2019 1607 by Rowe Robert, RN Outcome: Progressing Goal: Will remain free from infection 09/19/2019 1608 by Rowe Robert, RN Outcome: Progressing 09/19/2019 1607 by Rowe Robert, RN Outcome: Progressing Goal: Diagnostic test results will improve 09/19/2019 1608 by Rowe Robert, RN Outcome: Progressing 09/19/2019 1607 by Rowe Robert, RN Outcome: Progressing Goal: Respiratory complications will improve 09/19/2019 1608 by Rowe Robert, RN Outcome: Progressing 09/19/2019 1607 by Rowe Robert, RN Outcome: Progressing Note: Cont to have  o2 at 2 l [place] no resp distress Goal: Cardiovascular complication will be avoided Outcome: Progressing   Problem: Activity: Goal: Risk for activity intolerance will decrease Outcome: Progressing

## 2019-09-19 NOTE — Progress Notes (Signed)
Occupational Therapy Treatment Patient Details Name: Barbara [name] MRN: [id number] DOB: 02-04-1941 Today's Date: 09/19/2019    History of present illness Per MD:  "79 y.o. female with medical history significant of hypertension, hyperlipidemia, diet-controlled diabetes, COPD, asthma, GERD, ESRD-HD (MWF), OSA, CAD, bladder cancer, iron deficiency anemia.  She was admitted by Dr. Roland Rack for left total hip arthroplasty.  She developed acute respiratory distress after surgery.  She was subsequently transferred to the ICU for acute hypoxemic and hypercapnic respiratory failure.  Initially, she was treated with BiPAP.  However, she failed BiPAP therapy and required intubation and mechanical ventilation in the ICU.  She was also treated with IV antibiotics for aspiration pneumonia." Pt also recieved CRRT and transitioned to HD during this stay, as well as vasopressors for hypotension, MRI performed due to AMS and seizure like activity, MRI negative for acute findings. Cardiology consulted for bradycardia and sinus pauses, no indication for pacemaker, conservative approach. Pt extubated 09/08/2019 and transferred to medical floor.   OT comments  Patient seen this date for OT session.  Husband present at bedside.  Patient demonstrating lack of verbalization/communication with therapist.  Encouraged patient to perform simple grooming, but required TOTAL A due to lack of motivation.  Attempted to move towards EOB with TOTAL A.  Patient demonstrating no initiation of task despite encouragement and cuing.  Unable to determine level of patient's cognitive deficits due to lack of communication.  Required TOTAL A x 2 to position patient back supine with heels elevated.  Provided education on importance of participation in therapy to improve overall health.  Husband verbalized understanding, but patient did not.  Patient is not demonstrating progress towards goals.  Change of frequency to 1x/week.  Based on today's performance,  follow up recommendation remains appropriate.     Follow Up Recommendations  SNF    Equipment Recommendations  Other (comment)(Defer to next level of care)    Recommendations for Other Services      Precautions / Restrictions Precautions Precautions: Posterior Hip;Fall Precaution Booklet Issued: Yes (comment) Precaution Comments: mits; aspiration Restrictions Weight Bearing Restrictions: Yes LLE Weight Bearing: Weight bearing as tolerated       Mobility Bed Mobility Overal bed mobility: Needs Assistance Bed Mobility: Rolling Rolling: Total assist         General bed mobility comments: Attempted to move to EOB with HOB elevated.  Required TOTAL A.  Patient demonstrating no initiation of task despite encouragement and multiple verbal/tactile cues.  Transfers                 General transfer comment: Unable to assess.    Balance                                           ADL either performed or assessed with clinical judgement   ADL Overall ADL's : Needs assistance/impaired     Grooming: Wash/dry face;Bed level;Total assistance Grooming Details (indicate cue type and reason): Responds with "oh yeah" but does not attempt to participate                               General ADL Comments: Patient demonstrates very poor motivation to participate in ADLs.  Presents with decreased cognition and executive functioning, prohibiting appropriate responses.     Vision   Additional Comments: Patient tends  to look past therapist.   Perception     Praxis      Cognition Arousal/Alertness: Lethargic Behavior During Therapy: Flat affect Overall Cognitive Status: Impaired/Different from baseline Area of Impairment: Following commands;Awareness;Safety/judgement                       Following Commands: (Unable to follow one step commands) Safety/Judgement: Decreased awareness of deficits;Decreased awareness of safety      General Comments: Patient frequently responds with "oh, yeah", but does not appear to process questions or instructions.        Exercises  Other Exercises Other Exercises: Attempted ankle pumps and knee flexion at bed level.  Patient demonstrating no initiation of task. Other Exercises: Attempted sitting at EOB/bed mobility/log rolling.  REquiring TOTAL A.  Pt demo'd no initiation of task. Other Exercises: PRovided education on importance of participation in therapy and general daily tasks to improve overall health and independence.  Pt did not verbalize understanding.   Shoulder Instructions       General Comments Demonstrating poor motivation to participate in therapy and general self care tasks despite encouragement from staff and husband.    Pertinent Vitals/ Pain       Pain Assessment: Faces Faces Pain Scale: Hurts little more Pain Location: Difficult to pin point exact area of pain.  Patient demonstrated grimacing when therapist attempted to move R UE and B LEs Pain Descriptors / Indicators: Grimacing Pain Intervention(s): Limited activity within patient's tolerance;Monitored during session;Repositioned  Home Living Family/patient expects to be discharged to:: Private residence Living Arrangements: Spouse/significant other Available Help at Discharge: Available 24 hours/day Type of Home: House Home Access: [place]: One level     Bathroom Shower/Tub: Teacher, early years/pre: Standard     Home Equipment: Toilet riser;Bedside commode;Walker - 4 wheels;Shower seat          Prior Functioning/Environment              Frequency  Min 1X/week        Progress Toward Goals  OT Goals(current goals can now be found in the care plan section)  Progress towards OT goals: Not progressing toward goals - comment(Patient is demonstrating no motivation to participate with therapist or perform daily tasks with a higher level of  independence.)  Acute Rehab OT Goals Patient Stated Goal: unable to state Potential to Achieve Goals: Poor  Plan Discharge plan remains appropriate;Frequency needs to be updated    Co-evaluation                 AM-PAC OT "6 Clicks" Daily Activity     Outcome Measure   Help from another person eating meals?: A Lot Help from another person taking care of personal grooming?: Total Help from another person toileting, which includes using toliet, bedpan, or urinal?: Total Help from another person bathing (including washing, rinsing, drying)?: Total Help from another person to put on and taking off regular upper body clothing?: Total Help from another person to put on and taking off regular lower body clothing?: Total 6 Click Score: 7    End of Session    OT Visit Diagnosis: Other abnormalities of gait and mobility (R26.89);Muscle weakness (generalized) (M62.81);Pain   Activity Tolerance Patient limited by lethargy;Treatment limited secondary to agitation;Other (comment)(Limited due to cognitive deficits and lack of motivation.)   Patient Left in bed;with call bell/phone within reach;with bed alarm set;with family/visitor present   Nurse  Communication Precautions;Other (comment)(Discussed use of mit on R UE.)        Time: 0220-2669 OT Time Calculation (min): 33 min  Charges: OT General Charges $OT Visit: 1 Visit OT Treatments $Self Care/Home Management : 8-22 mins $Therapeutic Activity: 23-37 mins  Baldomero Lamy, MS, OTR/L 09/19/19, 4:28 PM

## 2019-09-19 NOTE — Plan of Care (Signed)
  Problem: Education: Goal: Knowledge of General Education information will improve Description: Including pain rating scale, medication(s)/side effects and non-pharmacologic comfort measures Outcome: Not Progressing Note: Patient confused   Problem: Nutrition: Goal: Adequate nutrition will be maintained Outcome: Progressing   Problem: Pain Managment: Goal: General experience of comfort will improve Outcome: Progressing Note: Patient voiced no pain this shift

## 2019-09-19 NOTE — Progress Notes (Signed)
[facility]  ROUNDING NOTE   Subjective:   Patient is overall doing fair More interactive compared to yesterday Still appears to be somewhat disoriented Speech is not very clear but sounds like she is having hallucinations Refusing to eat anything  Objective:  Vital signs in last 24 hours:  Temp:  [97.7 F (36.5 C)-99 F (37.2 C)] 98.4 F (36.9 C) (03/02 0749) Pulse Rate:  [51-81] 79 (03/02 0749) Resp:  [14-26] 19 (03/02 0749) BP: (86-157)/(48-78) 153/57 (03/02 0749) SpO2:  [99 %-100 %] 100 % (03/02 0749) Weight:  [83.6 kg] 83.6 kg (03/01 1055)  Weight change:  Filed Weights   09/16/19 0500 09/17/19 0550 09/18/19 1055  Weight: 82.3 kg 80.5 kg 83.6 kg    Intake/Output: I/O last 3 completed shifts: In: 421.3 [I.V.:92.2; IV Piggyback:329.1] Out: 1500 [Other:1500]   Intake/Output this shift:  No intake/output data recorded.   Physical Exam: General:  Laying in the bed, no acute distress  HEENT:  Anicteric, moist oral mucous membranes  Lungs:   Normal breathing effort  Heart:  No rub  Abdomen:   Soft, nontender  Extremities:  No peripheral edema  Neurologic:  Alert, able to answer few questions, oriented to self  Skin:  Warm  Access:  Left arm AV graft    Basic Metabolic Panel: Recent Labs  Lab 09/13/19 1217 09/13/19 1217 09/14/19 0303 09/14/19 0303 09/15/19 0433 09/15/19 0433 09/16/19 0505 09/17/19 0452 09/18/19 0305  NA 141   < > 142  --  142  --  143 140 141  K 3.9   < > 3.8  --  3.8  --  3.6 3.6 4.0  CL 100   < > 99  --  101  --  98 100 99  CO2 25   < > 30  --  27  --  27 28 24   GLUCOSE 144*   < > 126*  --  126*  --  136* 112* 99  BUN 88*   < > 53*  --  81*  --  64* 85* 98*  CREATININE 7.46*   < > 5.56*  --  7.80*  --  6.30* 8.50* 10.04*  CALCIUM 8.2*   < > 8.5*   < > 8.5*   < > 8.8* 8.6* 8.6*  MG  --   --  2.1  --  2.5*  --  2.2 2.4 2.6*  PHOS 2.3*  --   --   --  4.0  --  4.3 5.8* 7.4*   < > = values in this interval not displayed.     Liver Function Tests: Recent Labs  Lab 09/13/19 0737 09/13/19 1217 09/14/19 0303  AST 50*  --  37  ALT 13  --  10  ALKPHOS 67  --  63  BILITOT 0.5  --  0.5  PROT 7.3  --  6.9  ALBUMIN 2.2* 2.3* 2.1*   No results for input(s): LIPASE, AMYLASE in the last 168 hours. Recent Labs  Lab 09/12/19 1704  AMMONIA 24    CBC: Recent Labs  Lab 09/14/19 0303 09/15/19 0433 09/16/19 0505 09/17/19 0452 09/18/19 0305  WBC 17.6* 19.7* 15.9* 13.7* 12.8*  NEUTROABS 14.0*  --   --   --   --   HGB 9.1* 8.6* 8.8* 8.5* 8.6*  HCT 29.9* 28.4* 29.4* 28.6* 29.1*  MCV 100.0 101.4* 102.4* 102.5* 102.5*  PLT 336 434* 428* 483* 527*    Cardiac Enzymes: No results for input(s): CKTOTAL,  CKMB, CKMBINDEX, TROPONINI in the last 168 hours.  BNP: Invalid input(s): POCBNP  CBG: Recent Labs  Lab 09/18/19 1623 09/18/19 1959 09/19/19 0136 09/19/19 0431 09/19/19 0757  GLUCAP 90 81 107* 74 95    Microbiology: Results for orders placed or performed during the hospital encounter of 09/05/19  MRSA PCR Screening     Status: Abnormal   Collection Time: 09/06/19  7:50 AM   Specimen: Nasal Mucosa; Nasopharyngeal  Result Value Ref Range Status   MRSA by PCR POSITIVE (A) NEGATIVE Final    Comment:        The GeneXpert MRSA Assay (FDA approved for NASAL specimens only), is one component of a comprehensive MRSA colonization surveillance program. It is not intended to diagnose MRSA infection nor to guide or monitor treatment for MRSA infections. RESULT CALLED TO, READ BACK BY AND VERIFIED WITH: MEGAN SHEFFIELD AT 0931 ON 09/06/2019 [place]. Performed at [facility], [address] [place], [place] [address]     Coagulation Studies: No results for input(s): LABPROT, INR in the last 72 hours.  Urinalysis: No results for input(s): COLORURINE, LABSPEC, PHURINE, GLUCOSEU, HGBUR, BILIRUBINUR, KETONESUR, PROTEINUR, UROBILINOGEN, NITRITE, LEUKOCYTESUR in the last 72 hours.  Invalid  input(s): APPERANCEUR    Imaging: No results found.   Medications:   . sodium chloride    . dextrose Stopped (09/19/19 0211)  . famotidine (PEPCID) IV Stopped (09/18/19 2115)  . lacosamide (VIMPAT) IV Stopped (09/18/19 2210)  . piperacillin-tazobactam (ZOSYN)  IV 12.5 mL/hr at 09/19/19 0600   . B-complex with vitamin C  1 tablet Per Tube QHS  . chlorhexidine  15 mL Mouth/Throat QID  . epoetin (EPOGEN/PROCRIT) injection  10,000 Units Intravenous Q M,W,F-HD  . feeding supplement (NEPRO CARB STEADY)  1,000 mL Oral Q24H  . feeding supplement (PRO-STAT SUGAR FREE 64)  30 mL Per Tube BID  . free water  50 mL Per Tube Q4H  . heparin injection (subcutaneous)  5,000 Units Subcutaneous Q12H  . mouth rinse  15 mL Mouth Rinse BID   acetaminophen (TYLENOL) oral liquid 160 mg/5 mL, acetaminophen, acetaminophen, bisacodyl, hydrALAZINE, ipratropium-albuterol, ondansetron **OR** ondansetron (ZOFRAN) IV  Assessment/ Plan:    Barbara [name] is a 79 y.o. black female with end stage renal disease on hemodialysis, hypertension, diabetes mellitus type II, diabetic neuropathy, seizure disorder who was admitted for left hip repair and unfortunate developed seizure episode postoperatively and subsequently intubated. Required vasopressors and placed on CRRT.   [facility] MWF [facility] LUE AVG 79.5kg   1.  ESRD on HD MWF.  Next hemodialysis planned for Wednesday Prior to discharge, patient needs to be able to sit in the chair  2.  Acute respiratory failure:  Required intubation this admission Extubated now  3.  Anemia of chronic kidney disease.   Lab Results  Component Value Date   HGB 8.6 (L) 09/18/2019  Continue Epogen with dialysis  4.  Secondary hyperparathyroidism.   Lab Results  Component Value Date   PTH 310 (H) 09/06/2019   CALCIUM 8.6 (L) 09/18/2019   CAION 0.85 (LL) 09/05/2019   PHOS 7.4 (H) 09/18/2019  Monitor closely  5. Encephalopathy: with seizure disorder.   Slow  improvement   LOS: 14 Barbara [name] 3/2/202110:07 AM

## 2019-09-19 NOTE — NC FL2 (Signed)
[place] LEVEL OF CARE SCREENING TOOL     IDENTIFICATION  Patient Name: Barbara [name] Birthdate: Nov 29, 1940 Sex: female Admission Date (Current Location): 09/05/2019  [place] and [place] Number:  Selena Lesser [id number] [place] and Address:  [facility], [address] [place], [place] [address]      Provider Number: [id number]  Attending Physician Name and Address:  Ezekiel Slocumb, DO  Relative Name and Phone Number:  Rubbie Goostree [phone] / Daughter-Edna Kenton Kingf[place] [phone]    Current Level of Care: Hospital Recommended Level of Care: [place] Prior Approval Number:    Date Approved/Denied:   PASRR Number: [id number]  Discharge Plan: SNF    Current Diagnoses: Patient Active Problem List   Diagnosis Date Noted  . Acute encephalopathy   . Goals of care, counseling/discussion   . Aspiration pneumonia ([place]) 09/11/2019  . Status post total hip replacement, left 09/05/2019  . Type II diabetes mellitus with renal manifestations ([place]) 09/05/2019  . ESRD on dialysis ([place]) 09/05/2019  . Bacteremia due to other bacteria   . Bacteroides fragilis infection   . Acute metabolic encephalopathy   . Somnolence   . Lobar pneumonia ([place]) 05/11/2019  . Dialysis patient ([place])   . Primary osteoarthritis of left hip 04/07/2019  . Metabolic acidosis 97/67/3419  . Complication from renal dialysis device 01/28/2018  . History of colonic polyps 07/30/2017  . Presence of cardiac and vascular implant and graft   . Fever   . Sepsis ([place])   . ESRD (end stage renal disease) ([place]) 02/01/2017  . Sepsis secondary to UTI ([place]) 01/24/2017  . Acute kidney injury ([place]) 11/19/2016  . Anemia, chronic renal failure, stage 4 (severe) ([place]) 09/17/2016  . Hypoglycemia 06/04/2016  . Hyperkalemia 05/12/2016  . Protein-calorie malnutrition, severe 05/05/2016  . Acute on chronic renal failure ([place]) 05/04/2016  . Seizure ([place]) 04/17/2016   . Palliative care by specialist   . DNR (do not resuscitate) discussion   . C. difficile diarrhea 03/11/2016  . Anemia 03/11/2016  . Hypotension 03/11/2016  . Acidosis 03/11/2016  . Failure to thrive (child) 03/11/2016  . Weakness generalized 03/11/2016  . ARF (acute renal failure) ([place]) 03/04/2016  . Cancer of trigone of urinary bladder ([place]) 03/02/2016  . Absolute anemia 02/04/2016  . Airway hyperreactivity 02/04/2016  . Celiac disease 02/04/2016  . Gastric catarrh 02/04/2016  . Acid reflux 02/04/2016  . Combined fat and carbohydrate induced hyperlipemia 02/04/2016  . C. difficile colitis 01/22/2016  . Urinary obstruction 01/21/2016  . COPD (chronic obstructive pulmonary disease) ([place]) 01/21/2016  . Controlled type 2 diabetes mellitus with stage 4 chronic kidney disease, with long-term current use of insulin ([place]) 01/21/2016  . Essential hypertension 01/21/2016  . Acute renal failure superimposed on stage 4 chronic kidney disease ([place]) 01/20/2016  . Anemia in chronic kidney disease 01/20/2016  . Arthritis of knee, degenerative 05/10/2015  . Knee strain 03/26/2015  . Other intervertebral disc displacement, lumbar region 03/04/2015  . Degenerative arthritis of lumbar spine 03/04/2015  . HNP (herniated nucleus pulposus), lumbar 03/04/2015  . Osteoarthritis of spine with radiculopathy, lumbar region 03/04/2015  . Injury of tendon of upper extremity 11/12/2014  . Atherosclerosis of abdominal aorta ([place]) 11/02/2014  . Chronic kidney disease, stage IV (severe) ([place]) 11/02/2014  . Obstructive apnea 11/02/2014  . Complete rotator cuff rupture of left shoulder 10/05/2014  . Arthritis of shoulder region, degenerative 10/05/2014  . CAD in native artery 12/08/2013  . Benign essential HTN 12/08/2013  .  Type 2 diabetes mellitus ([place]) 12/08/2013    Orientation RESPIRATION BLADDER Height & Weight     Self  O2(2L [place]) Incontinent Weight: 184 lb 4.9 oz (83.6 kg) Height:  5' 6"  (167.6 cm)   BEHAVIORAL SYMPTOMS/MOOD NEUROLOGICAL BOWEL NUTRITION STATUS      Incontinent Diet(Dys 1 nctar thick liquids)  AMBULATORY STATUS COMMUNICATION OF NEEDS Skin   Extensive Assist Verbally Surgical wounds(Left Hip Surgery)                       Personal Care Assistance Level of Assistance  Bathing, Feeding, Dressing Bathing Assistance: Maximum assistance Feeding assistance: Maximum assistance Dressing Assistance: Maximum assistance     Functional Limitations Info  Sight, Hearing, Speech Sight Info: Adequate Hearing Info: Adequate Speech Info: Impaired(slurred/dysarthria)    SPECIAL CARE FACTORS FREQUENCY  Speech therapy     PT Frequency: 5x OT Frequency: 5x     Speech Therapy Frequency: 3-5x week      Contractures Contractures Info: Not present    Additional Factors Info  Code Status, Allergies Code Status Info: DNR Allergies Info: Glipizide ER, Percocet (Oxycodone acetaminophin)           Current Medications (09/19/2019):  This is the current hospital active medication list Current Facility-Administered Medications  Medication Dose Route Frequency Provider Last Rate Last Admin  . 0.9 %  sodium chloride infusion  250 mL Intravenous Continuous Flora Lipps, MD      . acetaminophen (TYLENOL) 160 MG/5ML solution 325 mg  325 mg Oral Q6H PRN Lavina Hamman, MD   325 mg at 09/15/19 0555  . acetaminophen (TYLENOL) suppository 650 mg  650 mg Rectal Q4H PRN Lavina Hamman, MD   650 mg at 09/14/19 0025  . acetaminophen (TYLENOL) tablet 325-650 mg  325-650 mg Oral Q6H PRN Corky Mull, MD   650 mg at 09/07/19 0908  . B-complex with vitamin C tablet 1 tablet  1 tablet Per Tube QHS Lavina Hamman, MD   Stopped at 09/18/19 2200  . bisacodyl (DULCOLAX) suppository 10 mg  10 mg Rectal Daily PRN Poggi, Marshall Cork, MD      . chlorhexidine (PERIDEX) 0.12 % solution 15 mL  15 mL Mouth/Throat QID Lavina Hamman, MD   15 mL at 09/18/19 1708  . dextrose 10 % infusion   Intravenous  Continuous Sharion Settler, NP   Stopped at 09/19/19 0211  . epoetin alfa (EPOGEN) injection 10,000 Units  10,000 Units Intravenous Q M,W,F-HD Kolluru, Sarath, MD   10,000 Units at 09/18/19 1125  . famotidine (PEPCID) IVPB 20 mg premix  20 mg Intravenous Q24H Lavina Hamman, MD   Stopped at 09/18/19 2115  . feeding supplement (NEPRO CARB STEADY) liquid 1,000 mL  1,000 mL Oral Q24H Lavina Hamman, MD 40 mL/hr at 09/16/19 1825 Rate Verify at 09/16/19 1825  . feeding supplement (PRO-STAT SUGAR FREE 64) liquid 30 mL  30 mL Per Tube BID Lavina Hamman, MD   Stopped at 09/18/19 2200  . free water 50 mL  50 mL Per Tube Q4H Lavina Hamman, MD   Stopped at 09/19/19 0000  . heparin injection 5,000 Units  5,000 Units Subcutaneous Q12H Tyler Pita, MD   5,000 Units at 09/18/19 2212  . hydrALAZINE (APRESOLINE) tablet 25 mg  25 mg Oral TID PRN Ivor Costa, MD      . ipratropium-albuterol (DUONEB) 0.5-2.5 (3) MG/3ML nebulizer solution 3 mL  3 mL Nebulization Q4H PRN  Darel Hong D, NP   3 mL at 09/08/19 2020  . lacosamide (VIMPAT) 100 mg in sodium chloride 0.9 % 25 mL IVPB  100 mg Intravenous Q12H Alexis Goodell, MD   Stopped at 09/18/19 2210  . MEDLINE mouth rinse  15 mL Mouth Rinse BID Tyler Pita, MD   15 mL at 09/18/19 0942  . ondansetron (ZOFRAN) tablet 4 mg  4 mg Oral Q6H PRN Poggi, Marshall Cork, MD       Or  . ondansetron [facility]) injection 4 mg  4 mg Intravenous Q6H PRN Poggi, Marshall Cork, MD      . piperacillin-tazobactam (ZOSYN) IVPB 3.375 g  3.375 g Intravenous Q12H Hallaji, Sheema M, RPH 12.5 mL/hr at 09/19/19 0600 Rate Verify at 09/19/19 0600     Discharge Medications: Please see discharge summary for a list of discharge medications.  Relevant Imaging Results:  Relevant Lab Results:   Additional Information SSN: [id number] HD- MWF  Tylesha Gibeault, Gardiner Rhyme, LCSW

## 2019-09-19 NOTE — Progress Notes (Addendum)
GI Inpatient Follow-up Note  Subjective:  Patient seen in follow-up for dysphagia, aspiration, and encephalopathy. Husband present at bedside this afternoon. No acute overnight events. Patient refused speech therapy evaluation today as she was agitated and refused. Hospitalist attempted to feed oatmeal and she refused. Husband has questions about the PEG as he is hesitant due to her issues with prolonged confusion after anesthesia.   Scheduled Inpatient Medications:  . B-complex with vitamin C  1 tablet Per Tube QHS  . chlorhexidine  15 mL Mouth/Throat QID  . epoetin (EPOGEN/PROCRIT) injection  10,000 Units Intravenous Q M,W,F-HD  . feeding supplement (NEPRO CARB STEADY)  1,000 mL Oral Q24H  . feeding supplement (PRO-STAT SUGAR FREE 64)  30 mL Per Tube BID  . free water  50 mL Per Tube Q4H  . heparin injection (subcutaneous)  5,000 Units Subcutaneous Q12H  . mouth rinse  15 mL Mouth Rinse BID    Continuous Inpatient Infusions:   . sodium chloride    . dextrose Stopped (09/19/19 0211)  . famotidine (PEPCID) IV Stopped (09/18/19 2115)  . lacosamide (VIMPAT) IV 100 mg (09/19/19 1111)  . piperacillin-tazobactam (ZOSYN)  IV 12.5 mL/hr at 09/19/19 0600    PRN Inpatient Medications:  acetaminophen (TYLENOL) oral liquid 160 mg/5 mL, acetaminophen, acetaminophen, bisacodyl, hydrALAZINE, ipratropium-albuterol, ondansetron **OR** ondansetron (ZOFRAN) IV  Review of Systems:  Unable to obtain due to patient's mental status    Physical Examination: BP (!) 153/57 (BP Location: Right Arm)   Pulse 79   Temp 98.4 F (36.9 C) (Oral)   Resp 19   Ht 5' 6"  (1.676 m)   Wt 83.6 kg   SpO2 100%   BMI 29.75 kg/m  Gen: NAD, alert and oriented to self and place HEENT: PEERLA, EOMI, Neck: supple, no JVD or thyromegaly Chest: CTA bilaterally, no wheezes, crackles, or other adventitious sounds CV: RRR, no m/g/c/r Abd: soft, NT, ND, +BS in all four quadrants; no HSM, guarding, ridigity, or rebound  tenderness Ext: no edema, well perfused with 2+ pulses, Skin: no rash or lesions noted Lymph: no LAD  Data: Lab Results  Component Value Date   WBC 10.7 (H) 09/19/2019   HGB 10.1 (L) 09/19/2019   HCT 34.4 (L) 09/19/2019   MCV 103.3 (H) 09/19/2019   PLT 429 (H) 09/19/2019   Recent Labs  Lab 09/17/19 0452 09/18/19 0305 09/19/19 0939  HGB 8.5* 8.6* 10.1*   Lab Results  Component Value Date   NA 140 09/19/2019   K 4.7 09/19/2019   CL 101 09/19/2019   CO2 21 (L) 09/19/2019   BUN 47 (H) 09/19/2019   CREATININE 6.49 (H) 09/19/2019   Lab Results  Component Value Date   ALT 10 09/14/2019   AST 37 09/14/2019   ALKPHOS 63 09/14/2019   BILITOT 0.5 09/14/2019   No results for input(s): APTT, INR, PTT in the last 168 hours. Assessment/Plan:  79 y/o AA female with a PMH of HTN, HLD, diet-controlled diabetes, COPD, asthma, GERD, ESRD-HD (MWF), OSA, CAD, Hx of bladder cancer, and IDA admitted for total hip arthroplasty complicated by acute hypoxic respiratory failure s/p intubation and mechanical ventilation  1. Dysphagia 2. Aspiration pneumonia 3. Acute toxic metabolic encephalopathy 4. Multiple medical comorbidities 5. DNR Status    -I had long discussion with patient and husband about the indications of PEG tube placement and it's ability to enable her to receive medications, nutrition, and feedings directly through her stomach. We discussed PEG does not change prognosis and there is  still risk of her pulling PEG tube out -Speech therapy attempted to evaluate pt today and she became agitated and refused -Husband consents for PEG tube placement. -Plan for PEG tomorrow with Dr. Alice Reichert -She is on IV Zosyn for aspiration pneumonia. Continue.  -See procedure note for further details -I have discussed timing of PEG with her nephrologist (Dr. Candiss Norse) who reports that PEG can be placed tomorrow and HD can be done after or pushed back to Thursday   I reviewed the risks (including  bleeding, perforation, infection, anesthesia complications, cardiac/respiratory complications), benefits and alternatives of PEG Tube placement. Patient consents to proceed.    Please call with questions or concerns.    Octavia Bruckner, PA-C [facility] [phone] (Cell)

## 2019-09-19 NOTE — TOC Progression Note (Signed)
Transition of Care [facility]) - Progression Note    Patient Details  Name: VEIDA SPIRA MRN: [id number] Date of Birth: 30-Apr-1941  Transition of Care [facility]) CM/SW Contact  Chardai Gangemi, Gardiner Rhyme, LCSW Phone Number: 09/19/2019, 10:55 AM  Clinical Narrative:   Have updated FL2 and [facility] the daughter's-husband first choice, since daughter's boyfriend is there. Will work on discharge plans and rehab since pt will definitely need this at discharge from the hospital.    Expected Discharge Plan: [place] Barriers to Discharge: Continued Medical Work up  Expected Discharge Plan and Services Expected Discharge Plan: [place] In-house Referral: Clinical Social Work     Living arrangements for the past 2 months: Single Family Home                                       Social Determinants of Health (SDOH) Interventions    Readmission Risk Interventions No flowsheet data found.

## 2019-09-20 ENCOUNTER — Encounter: Payer: Self-pay | Admitting: Surgery

## 2019-09-20 ENCOUNTER — Inpatient Hospital Stay: Payer: Medicare Other | Admitting: Anesthesiology

## 2019-09-20 ENCOUNTER — Encounter
Admission: RE | Disposition: A | Discharge status: Skilled Nursing Facility | Payer: Self-pay | Source: Home / Self Care | Attending: Internal Medicine

## 2019-09-20 DIAGNOSIS — D631 Anemia in chronic kidney disease: Secondary | ICD-10-CM

## 2019-09-20 DIAGNOSIS — N189 Chronic kidney disease, unspecified: Secondary | ICD-10-CM

## 2019-09-20 HISTORY — PX: PEG PLACEMENT: SHX5437

## 2019-09-20 LAB — GLUCOSE, CAPILLARY
Glucose-Capillary: 115 mg/dL — ABNORMAL HIGH (ref 70–99)
Glucose-Capillary: 100 mg/dL — ABNORMAL HIGH (ref 70–99)
Glucose-Capillary: 109 mg/dL — ABNORMAL HIGH (ref 70–99)
Glucose-Capillary: 98 mg/dL (ref 70–99)
Glucose-Capillary: 114 mg/dL — ABNORMAL HIGH (ref 70–99)
Glucose-Capillary: 127 mg/dL — ABNORMAL HIGH (ref 70–99)

## 2019-09-20 LAB — MAGNESIUM: Magnesium: 2.1 mg/dL (ref 1.7–2.4)

## 2019-09-20 LAB — BASIC METABOLIC PANEL
Anion gap: 18 — ABNORMAL HIGH (ref 5–15)
BUN: 61 mg/dL — ABNORMAL HIGH (ref 8–23)
CO2: 24 mmol/L (ref 22–32)
Calcium: 8.7 mg/dL — ABNORMAL LOW (ref 8.9–10.3)
Chloride: 100 mmol/L (ref 98–111)
Creatinine, Ser: 7.91 mg/dL — ABNORMAL HIGH (ref 0.44–1.00)
GFR calc Af Amer: 5 mL/min — ABNORMAL LOW (ref >60)
GFR calc non Af Amer: 4 mL/min — ABNORMAL LOW (ref >60)
Glucose, Bld: 124 mg/dL — ABNORMAL HIGH (ref 70–99)
Potassium: 3.5 mmol/L (ref 3.5–5.1)
Sodium: 142 mmol/L (ref 135–145)

## 2019-09-20 LAB — CBC
HCT: 29.8 % — ABNORMAL LOW (ref 36.0–46.0)
Hemoglobin: 8.9 g/dL — ABNORMAL LOW (ref 12.0–15.0)
MCH: 30.3 pg (ref 26.0–34.0)
MCHC: 29.9 g/dL — ABNORMAL LOW (ref 30.0–36.0)
MCV: 101.4 fL — ABNORMAL HIGH (ref 80.0–100.0)
Platelets: 479 10*3/uL — ABNORMAL HIGH (ref 150–400)
RBC: 2.94 MIL/uL — ABNORMAL LOW (ref 3.87–5.11)
RDW: 14.2 % (ref 11.5–15.5)
WBC: 12.2 10*3/uL — ABNORMAL HIGH (ref 4.0–10.5)
nRBC: 0 % (ref 0.0–0.2)

## 2019-09-20 LAB — PHOSPHORUS: Phosphorus: 7.4 mg/dL — ABNORMAL HIGH (ref 2.5–4.6)

## 2019-09-20 SURGERY — INSERTION, PEG TUBE
Anesthesia: General

## 2019-09-20 MED ORDER — LIDOCAINE HCL (CARDIAC) PF 100 MG/5ML IV SOSY
PREFILLED_SYRINGE | INTRAVENOUS | Status: DC | PRN
  Administered 2019-09-20: 80 mg via INTRAVENOUS

## 2019-09-20 MED ORDER — FENTANYL CITRATE (PF) 100 MCG/2ML IJ SOLN
INTRAMUSCULAR | Status: AC
  Filled 2019-09-20: qty 2

## 2019-09-20 MED ORDER — SUCCINYLCHOLINE CHLORIDE 20 MG/ML IJ SOLN
INTRAMUSCULAR | Status: AC
  Filled 2019-09-20: qty 1

## 2019-09-20 MED ORDER — ROCURONIUM BROMIDE 50 MG/5ML IV SOLN
INTRAVENOUS | Status: AC
  Filled 2019-09-20: qty 1

## 2019-09-20 MED ORDER — SODIUM CHLORIDE 0.9 % IV SOLN: Freq: Once | INTRAVENOUS | Status: DC

## 2019-09-20 MED ORDER — SUCCINYLCHOLINE CHLORIDE 20 MG/ML IJ SOLN
INTRAMUSCULAR | Status: DC | PRN
  Administered 2019-09-20: 100 mg via INTRAVENOUS

## 2019-09-20 MED ORDER — FENTANYL CITRATE (PF) 100 MCG/2ML IJ SOLN: 25.0000 ug | INTRAMUSCULAR | Status: DC | PRN

## 2019-09-20 MED ORDER — ONDANSETRON HCL 4 MG/2ML IJ SOLN
INTRAMUSCULAR | Status: AC
  Filled 2019-09-20: qty 2

## 2019-09-20 MED ORDER — PHENYLEPHRINE HCL (PRESSORS) 10 MG/ML IV SOLN
INTRAVENOUS | Status: DC | PRN
  Administered 2019-09-20: 100 ug via INTRAVENOUS
  Administered 2019-09-20 (×2): 200 ug via INTRAVENOUS
  Administered 2019-09-20 (×2): 100 ug via INTRAVENOUS

## 2019-09-20 MED ORDER — GLYCOPYRROLATE 0.2 MG/ML IJ SOLN
INTRAMUSCULAR | Status: DC | PRN
  Administered 2019-09-20: .2 mg via INTRAVENOUS

## 2019-09-20 MED ORDER — FENTANYL CITRATE (PF) 100 MCG/2ML IJ SOLN
INTRAMUSCULAR | Status: DC | PRN
  Administered 2019-09-20: 25 ug via INTRAVENOUS

## 2019-09-20 MED ORDER — ONDANSETRON HCL 4 MG/2ML IJ SOLN
INTRAMUSCULAR | Status: DC | PRN
  Administered 2019-09-20: 4 mg via INTRAVENOUS

## 2019-09-20 MED ORDER — ONDANSETRON HCL 4 MG/2ML IJ SOLN: 4.0000 mg | Freq: Once | INTRAMUSCULAR | Status: DC | PRN

## 2019-09-20 MED ORDER — CEFAZOLIN SODIUM-DEXTROSE 2-4 GM/100ML-% IV SOLN
2.0000 g | Freq: Once | INTRAVENOUS | Status: AC
  Administered 2019-09-20: 2 g via INTRAVENOUS
  Filled 2019-09-20: qty 100

## 2019-09-20 MED ORDER — DEXAMETHASONE SODIUM PHOSPHATE 4 MG/ML IJ SOLN
INTRAMUSCULAR | Status: DC | PRN
  Administered 2019-09-20: 4 mg via INTRAVENOUS

## 2019-09-20 MED ORDER — SODIUM CHLORIDE 0.9 % IV SOLN: INTRAVENOUS | Status: DC

## 2019-09-20 MED ORDER — ESMOLOL HCL 100 MG/10ML IV SOLN
INTRAVENOUS | Status: DC | PRN
  Administered 2019-09-20: 20 mg via INTRAVENOUS

## 2019-09-20 MED ORDER — LIDOCAINE HCL (PF) 2 % IJ SOLN
INTRAMUSCULAR | Status: AC
  Filled 2019-09-20: qty 5

## 2019-09-20 MED ORDER — PROPOFOL 10 MG/ML IV BOLUS
INTRAVENOUS | Status: DC | PRN
  Administered 2019-09-20: 30 mg via INTRAVENOUS
  Administered 2019-09-20: 40 mg via INTRAVENOUS

## 2019-09-20 NOTE — Progress Notes (Signed)
Cancellation Note:  Pt currently off floor for procedure.  Will re-attempt PT treatment session at a later date/time as medically appropriate.  Leitha Bleak, PT 09/20/19, 2:18 PM

## 2019-09-20 NOTE — Progress Notes (Signed)
[facility]  ROUNDING NOTE   Subjective:   Patient is quite lethargic today Denies any acute complaints  Objective:  Vital signs in last 24 hours:  Temp:  [97.5 F (36.4 C)-98.1 F (36.7 C)] 97.5 F (36.4 C) (03/03 0828) Pulse Rate:  [72-87] 72 (03/03 0828) Resp:  [16-20] 20 (03/03 0828) BP: (118-147)/(54-59) 118/59 (03/03 0828) SpO2:  [99 %-100 %] 99 % (03/03 0828)  Weight change:  Filed Weights   09/16/19 0500 09/17/19 0550 09/18/19 1055  Weight: 82.3 kg 80.5 kg 83.6 kg    Intake/Output: I/O last 3 completed shifts: In: 679.8 [P.O.:100; I.V.:275.3; IV Piggyback:304.5] Out: -    Intake/Output this shift:  No intake/output data recorded.   Physical Exam: General:  Laying in the bed, no acute distress  HEENT:  Anicteric, moist oral mucous membranes  Lungs:   Normal breathing effort  Heart:  No rub  Abdomen:   Soft, nontender  Extremities:  No peripheral edema  Neurologic:  Lethargic today, less interactive  Skin:  Warm  Access:  Left arm AV graft    Basic Metabolic Panel: Recent Labs  Lab 09/16/19 0505 09/16/19 0505 09/17/19 0452 09/17/19 0452 09/18/19 0305 09/19/19 0939 09/20/19 0337  NA 143  --  140  --  141 140 142  K 3.6  --  3.6  --  4.0 4.7 3.5  CL 98  --  100  --  99 101 100  CO2 27  --  28  --  24 21* 24  GLUCOSE 136*  --  112*  --  99 88 124*  BUN 64*  --  85*  --  98* 47* 61*  CREATININE 6.30*  --  8.50*  --  10.04* 6.49* 7.91*  CALCIUM 8.8*   < > 8.6*   < > 8.6* 8.5* 8.7*  MG 2.2  --  2.4  --  2.6* 2.2 2.1  PHOS 4.3  --  5.8*  --  7.4* 5.8* 7.4*   < > = values in this interval not displayed.    Liver Function Tests: Recent Labs  Lab 09/13/19 1217 09/14/19 0303  AST  --  37  ALT  --  10  ALKPHOS  --  63  BILITOT  --  0.5  PROT  --  6.9  ALBUMIN 2.3* 2.1*   No results for input(s): LIPASE, AMYLASE in the last 168 hours. No results for input(s): AMMONIA in the last 168 hours.  CBC: Recent Labs  Lab  09/14/19 0303 09/15/19 0433 09/16/19 0505 09/17/19 0452 09/18/19 0305 09/19/19 0939 09/20/19 0337  WBC 17.6*   < > 15.9* 13.7* 12.8* 10.7* 12.2*  NEUTROABS 14.0*  --   --   --   --   --   --   HGB 9.1*   < > 8.8* 8.5* 8.6* 10.1* 8.9*  HCT 29.9*   < > 29.4* 28.6* 29.1* 34.4* 29.8*  MCV 100.0   < > 102.4* 102.5* 102.5* 103.3* 101.4*  PLT 336   < > 428* 483* 527* 429* 479*   < > = values in this interval not displayed.    Cardiac Enzymes: No results for input(s): CKTOTAL, CKMB, CKMBINDEX, TROPONINI in the last 168 hours.  BNP: Invalid input(s): POCBNP  CBG: Recent Labs  Lab 09/19/19 0757 09/19/19 1136 09/19/19 1653 09/19/19 2003 09/20/19 0829  GLUCAP 95 115* 129* 121* 115*    Microbiology: Results for orders placed or performed during the hospital encounter of 09/05/19  MRSA  PCR Screening     Status: Abnormal   Collection Time: 09/06/19  7:50 AM   Specimen: Nasal Mucosa; Nasopharyngeal  Result Value Ref Range Status   MRSA by PCR POSITIVE (A) NEGATIVE Final    Comment:        The GeneXpert MRSA Assay (FDA approved for NASAL specimens only), is one component of a comprehensive MRSA colonization surveillance program. It is not intended to diagnose MRSA infection nor to guide or monitor treatment for MRSA infections. RESULT CALLED TO, READ BACK BY AND VERIFIED WITH: MEGAN SHEFFIELD AT 0931 ON 09/06/2019 [place]. Performed at [facility], [address] [place], [place] [address]     Coagulation Studies: No results for input(s): LABPROT, INR in the last 72 hours.  Urinalysis: No results for input(s): COLORURINE, LABSPEC, PHURINE, GLUCOSEU, HGBUR, BILIRUBINUR, KETONESUR, PROTEINUR, UROBILINOGEN, NITRITE, LEUKOCYTESUR in the last 72 hours.  Invalid input(s): APPERANCEUR    Imaging: No results found.   Medications:   . sodium chloride    . sodium chloride    . dextrose 30 mL/hr at 09/19/19 1500  . famotidine (PEPCID) IV 20 mg (09/19/19 1859)  .  lacosamide (VIMPAT) IV 100 mg (09/19/19 2221)   . B-complex with vitamin C  1 tablet Per Tube QHS  . chlorhexidine  15 mL Mouth/Throat QID  . epoetin (EPOGEN/PROCRIT) injection  10,000 Units Intravenous Q M,W,F-HD  . feeding supplement (NEPRO CARB STEADY)  1,000 mL Oral Q24H  . feeding supplement (PRO-STAT SUGAR FREE 64)  30 mL Per Tube BID  . free water  50 mL Per Tube Q4H  . heparin injection (subcutaneous)  5,000 Units Subcutaneous Q12H  . mouth rinse  15 mL Mouth Rinse BID   acetaminophen (TYLENOL) oral liquid 160 mg/5 mL, acetaminophen, acetaminophen, bisacodyl, hydrALAZINE, ipratropium-albuterol, ondansetron **OR** ondansetron (ZOFRAN) IV  Assessment/ Plan:    Barbara [name] is a 79 y.o. black female with end stage renal disease on hemodialysis, hypertension, diabetes mellitus type II, diabetic neuropathy, seizure disorder who was admitted for left hip repair and unfortunate developed seizure episode postoperatively and subsequently intubated. Required vasopressors and placed on CRRT.   [facility] MWF [facility] LUE AVG 79.5kg   1.  ESRD on HD MWF.  Next hemodialysis planned for Thursday-patient has procedure scheduled today Prior to discharge, patient needs to be able to sit in the chair  2.  Acute respiratory failure:  Required intubation this admission Extubated now  3.  Anemia of chronic kidney disease.   Lab Results  Component Value Date   HGB 8.9 (L) 09/20/2019  Continue Epogen with dialysis  4.  Secondary hyperparathyroidism.   Lab Results  Component Value Date   PTH 310 (H) 09/06/2019   CALCIUM 8.7 (L) 09/20/2019   CAION 0.85 (LL) 09/05/2019   PHOS 7.4 (H) 09/20/2019  Monitor closely  5. Encephalopathy: with seizure disorder.   Slow improvement   LOS: 15 Barbara [name] 3/3/202111:01 AM

## 2019-09-20 NOTE — Anesthesia Preprocedure Evaluation (Addendum)
Anesthesia Evaluation  Patient identified by MRN, date of birth, ID band Patient awake and Patient confused    Reviewed: Allergy & Precautions, NPO status , Patient's Chart, lab work & pertinent test results  History of Anesthesia Complications Negative for: history of anesthetic complications  Airway Mallampati: IV  TM Distance: >3 FB Neck ROM: limited    Dental no notable dental hx. (+) Edentulous Upper, Poor Dentition, Chipped   Pulmonary shortness of breath, asthma , sleep apnea, Continuous Positive Airway Pressure Ventilation and Oxygen sleep apnea , pneumonia, COPD,  oxygen dependent, Patient abstained from smoking.Not current smoker, former smoker,  2L O2 [place] at night   Pulmonary exam normal breath sounds clear to auscultation       Cardiovascular Exercise Tolerance: Poor METShypertension, Pt. on medications + CAD and +CHF  (-) Past MI (-) dysrhythmias  Rhythm:Regular Rate:Normal - Systolic murmurs Patient unclear about cardiac history. Denies stents/MI, says maybe she has CHF. TTE 2021 unremarkable other than grade 1 diastolic dysfunction   Neuro/Psych Seizures -,   Neuromuscular disease negative psych ROS   GI/Hepatic GERD  ,(+)     (-) substance abuse  ,   Endo/Other  diabetes, Type 2  Renal/GU ESRF and DialysisRenal diseasenegative Renal ROSLast dialyzed yesterday     Musculoskeletal  (+) Arthritis ,   Abdominal   Peds  Hematology   Anesthesia Other Findings Past Medical History: No date: Adult celiac disease 2017: Anemia associated with chronic renal failure     Comment:  blood transfusion last week 10/17 No date: Arthritis No date: Asthma 2017: Cancer ([place])     Comment:  bladder No date: Chronic kidney disease No date: CKD (chronic kidney disease)     Comment:  stage IV kidney disease.  dr. Candiss Norse and dr. Holley Raring               follow her No date: COPD (chronic obstructive pulmonary disease)  ([place]) No date: Coronary artery disease No date: Diabetes mellitus without complication ([place]) No date: Dialysis patient [facility])     Comment:  Tues, Thurs, Sat No date: Dyspnea     Comment:  with exertion No date: Elevated lipids No date: GERD (gastroesophageal reflux disease) No date: Hematuria No date: Hypertension No date: Lower back pain No date: Neuropathy No date: Oxygen dependent     Comment:  at hs 2017: Personal history of chemotherapy     Comment:  bladder ca 2017: Personal history of radiation therapy     Comment:  bladder ca No date: Sleep apnea     Comment:  uses cpap 01/2016: Urinary obstruction  Reproductive/Obstetrics                           Anesthesia Physical  Anesthesia Plan  ASA: IV  Anesthesia Plan: General   Post-op Pain Management:    Induction: Intravenous  PONV Risk Score and Plan: 3 and Ondansetron, Dexamethasone and Propofol infusion  Airway Management Planned: Oral ETT  Additional Equipment:   Intra-op Plan:   Post-operative Plan: Possible Post-op intubation/ventilation  Informed Consent: I have reviewed the patients History and Physical, chart, labs and discussed the procedure including the risks, benefits and alternatives for the proposed anesthesia with the patient or authorized representative who has indicated his/her understanding and acceptance.   Patient has DNR.  Suspend DNR and Discussed DNR with power of attorney.   Dental Advisory Given  Plan Discussed with: CRNA and Surgeon  Anesthesia Plan  Comments: (Unable to reach the patients husband by phone  Phone consent from the patients daughter Norberto Sorenson at [phone].  Daughter felt comfortable consenting for her mother and reported that she would try to talk to her father about this as well.  Daughter consented for risks of anesthesia including but not limited to:  - adverse reactions to medications - damage to teeth, lips or other oral mucosa -  sore throat or hoarseness - Damage to heart, brain, lungs or loss of life  Daughter voiced understanding.)      Anesthesia Quick Evaluation

## 2019-09-20 NOTE — Progress Notes (Signed)
Pre HD Tx Note  Pt arrived from her room to receive HD Tx. Pt is on 2LO2 per [place]. SPO2 100%. BP WDL Pt is alert to self and place not to time. Pt reports no chest pain or SOB. Status post PEG placement. Pt is asleep however easily awakened. Ts should run on 3K2.25Ca for 3 hrs. Will keep monitoring   09/20/19 1700  Hand-Off documentation  Report given to (Full Name) Newt Minion RN   Report received from (Full Name) Rowe Robert RN   Vital Signs  Temp 98.4 F (36.9 C)  Temp Source Oral  Pulse Rate 71  Pulse Rate Source Monitor  Resp 20  BP 140/60  BP Location Right Arm  BP Method Automatic  Patient Position (if appropriate) Lying  Oxygen Therapy  SpO2 100 %  O2 Device Nasal Cannula  O2 Flow Rate (L/min) 2 L/min  Pain Assessment  Pain Scale 0-10  Pain Score Asleep  Dialysis Weight  Weight 83.6 kg  Type of Weight Pre-Dialysis  Time-Out for Hemodialysis  What Procedure? HD  Pt Identifiers(min of two) MRN/Account#;First/Last Name  Correct Site? Yes  Correct Side? Yes  Correct Procedure? Yes  Consents Verified? Yes  Safety Precautions Reviewed? Yes  Engineer, civil (consulting) Number 2  Station Number 1  UF/Alarm Test Passed  Conductivity: Meter 13.8  Conductivity: Machine  14  pH 7.4  Reverse Osmosis Main  Normal Saline Lot Number [id number]  Dialyzer Lot Number [id number]  Disposable Set Lot Number [id number]  Machine Temperature 98.6 F (37 C)  Musician and Audible Yes  Blood Lines Intact and Secured Yes  Pre Treatment Patient Checks  Vascular access used during treatment Fistula  HD catheter dressing before treatment WDL  Patient is receiving dialysis in a chair  (In Bed)  Hepatitis B Surface Antigen Results Negative  Date Hepatitis B Surface Antigen Drawn 09/11/19  Hepatitis B Surface Antibody  (Unknown)  Date Hepatitis B Surface Antibody Drawn 09/11/19  Hemodialysis Consent Verified Yes  Hemodialysis Standing Orders Initiated Yes  ECG (Telemetry) Monitor On  Yes  Prime Ordered Normal Saline  Length of  DialysisTreatment -hour(s) 3 Hour(s)  Dialysis Treatment Comments  (NA)  Dialyzer Elisio 17H NR  Dialysate 3K;2.25 Ca  Dialysate Flow Ordered 800  Blood Flow Rate Ordered 400 mL/min  Ultrafiltration Goal 0 Liters  Blood Pressure Parameters  (0 -0.5)  Education / Care Plan  Dialysis Education Provided Yes  Documented Education in Care Plan Yes  Fistula / Graft Left Upper arm Arteriovenous vein graft  Placement Date/Time: 02/17/17 1111   Orientation: Left  Access Location: Upper arm  Access Type: Arteriovenous vein graft  Site Condition No complications  Fistula / Graft Assessment Present;Thrill;Bruit  Status Accessed  Needle Size 15  Drainage Description None

## 2019-09-20 NOTE — Anesthesia Procedure Notes (Signed)
Procedure Name: Intubation Date/Time: 09/20/2019 2:27 PM Performed by: Lowry Bowl, CRNA Pre-anesthesia Checklist: Patient identified, Emergency Drugs available, Suction available and Patient being monitored Patient Re-evaluated:Patient Re-evaluated prior to induction Oxygen Delivery Method: Circle system utilized Preoxygenation: Pre-oxygenation with 100% oxygen Induction Type: IV induction and Rapid sequence Ventilation: Two handed mask ventilation required Laryngoscope Size: McGraph and 4 Grade View: Grade I Tube type: Oral Tube size: 7.0 mm Number of attempts: 1 Airway Equipment and Method: Stylet and Video-laryngoscopy Placement Confirmation: ETT inserted through vocal cords under direct vision,  positive ETCO2 and breath sounds checked- equal and bilateral Secured at: 21 cm Tube secured with: Tape Dental Injury: Teeth and Oropharynx as per pre-operative assessment

## 2019-09-20 NOTE — Transfer of Care (Signed)
Immediate Anesthesia Transfer of Care Note  Patient: Barbara [name]  Procedure(s) Performed: PERCUTANEOUS ENDOSCOPIC GASTROSTOMY (PEG) PLACEMENT (N/A )  Patient Location: PACU  Anesthesia Type:General  Level of Consciousness: awake, drowsy and patient cooperative  Airway & Oxygen Therapy: Patient Spontanous Breathing and Patient connected to face mask oxygen  Post-op Assessment: Report given to RN and Post -op Vital signs reviewed and stable  Post vital signs: Reviewed and stable  Last Vitals:  Vitals Value Taken Time  BP 137/71 09/20/19 1521  Temp 37 C 09/20/19 1521  Pulse 92 09/20/19 1528  Resp 19 09/20/19 1528  SpO2 100 % 09/20/19 1528  Vitals shown include unvalidated device data.  Last Pain:  Vitals:   09/20/19 1339  TempSrc: Temporal  PainSc: 0-No pain      Patients Stated Pain Goal: 0 (09/40/00 5056)  Complications: No apparent anesthesia complications

## 2019-09-20 NOTE — Progress Notes (Signed)
HD Tx Started    09/20/19 1730  Vital Signs  Pulse Rate 72  Resp 20  BP (!) 135/56  Oxygen Therapy  SpO2 100 %  O2 Device Nasal Cannula  O2 Flow Rate (L/min) 2 L/min  During Hemodialysis Assessment  Blood Flow Rate (mL/min) 250 mL/min  Arterial Pressure (mmHg) -100 mmHg  Venous Pressure (mmHg) 90 mmHg  Transmembrane Pressure (mmHg) 10 mmHg  Ultrafiltration Rate (mL/min) 330 mL/min  Dialysate Flow Rate (mL/min) 800 ml/min  Conductivity: Machine  14  HD Safety Checks Performed Yes  Dialysis Fluid Bolus Normal Saline  Bolus Amount (mL) 250 mL  Intra-Hemodialysis Comments Tx initiated

## 2019-09-20 NOTE — Progress Notes (Signed)
OT Cancellation Note  Patient Details Name: MALIA CORSI MRN: [id number] DOB: 08/07/1940   Cancelled Treatment:    Reason Eval/Treat Not Completed: Patient at procedure or test/ unavailable   Patient not in room and unavailable for OT session.  Will re-attempt as able and appropriate.  Oren Binet 09/20/2019, 2:34 PM

## 2019-09-20 NOTE — Progress Notes (Signed)
PROGRESS NOTE    Barbara [name]  LKG:[id number] DOB: September 28, 1940 DOA: 09/05/2019 PCP: [name], MD    Brief Narrative:  Barbara Bongo Conyersis an 79 y.o.femalemedical history significant of hypertension, hyperlipidemia, diet-controlled diabetes, COPD, asthma, GERD, ESRD-HD (MWF), OSA, CAD, bladder cancer, iron deficiency anemia. She was admitted by Dr. Kizzie Bane for lefttotal hip arthroplasty. She developed acute respiratory distress after surgery. She was subsequently transferred to the ICU for acute hypoxemic and hypercapnic respiratory failure. Initially, she was treated with BiPAP. However, she failed BiPAP therapy and required intubation and mechanical ventilation in the ICU. She was also treated with IV antibiotics for aspiration pneumonia.  She was also seen by the nephrologist for end-stage renal disease. She was initially placed on CRRT and then transition to hemodialysis. She required vasopressors for hypotension.  She was seen by the neurologist for altered mental status and seizure activity. MRI brain did not show any acute stroke. Neurologist recommended Vimpat and monitoring for seizure  She was also seen by the cardiologist because of bradycardia and sinus pauses on telemetry. Per cardiologist, there was no indication for permanent pacemaker and he recommended a conservative approach.  She was extubated on 09/08/2019. Her condition was slowly improving so she was was transferred to the medical floor for continued medical management.     Consultants:   GI, nephrology  Procedures: Peg Tube  Antimicrobials:   zosyn   Subjective: Pt laying in bed. Sleepy, barely opens eyes to talk to me, mumbles some words stating "she is ok". Not very interactive with me .  Objective: Vitals:   09/19/19 0749 09/19/19 1523 09/20/19 0023 09/20/19 0828  BP: (!) 153/57 (!) 132/59 (!) 147/54 (!) 118/59  Pulse: 79 87 80 72  Resp: 19 18 16 20   Temp: 98.4 F (36.9 C) 98.1 F  (36.7 C)  (!) 97.5 F (36.4 C)  TempSrc: Oral Oral  Oral  SpO2: 100% 100% 100% 99%  Weight:      Height:        Intake/Output Summary (Last 24 hours) at 09/20/2019 1241 Last data filed at 09/19/2019 1500 Gross per 24 hour  Intake 223.78 ml  Output -  Net 223.78 ml   Filed Weights   09/16/19 0500 09/17/19 0550 09/18/19 1055  Weight: 82.3 kg 80.5 kg 83.6 kg    Examination:  General exam: Appears calm and comfortable in bed. Not very interactive. Respiratory system: Clear to auscultation anteriorly with poor respiratory effort normal. Cardiovascular system: S1 & S2 heard, RRR. No murmurs, rubs, gallops or clicks. Gastrointestinal system: Abdomen is nondistended, soft and nontender. Normal bowel sounds heard. Central nervous system: unable to assess due to pt sleepy and not cooperative with exam Extremities: no edema Skin: warm, dry     Data Reviewed: I have personally reviewed following labs and imaging studies  CBC: Recent Labs  Lab 09/14/19 0303 09/15/19 0433 09/16/19 0505 09/17/19 0452 09/18/19 0305 09/19/19 0939 09/20/19 0337  WBC 17.6*   < > 15.9* 13.7* 12.8* 10.7* 12.2*  NEUTROABS 14.0*  --   --   --   --   --   --   HGB 9.1*   < > 8.8* 8.5* 8.6* 10.1* 8.9*  HCT 29.9*   < > 29.4* 28.6* 29.1* 34.4* 29.8*  MCV 100.0   < > 102.4* 102.5* 102.5* 103.3* 101.4*  PLT 336   < > 428* 483* 527* 429* 479*   < > = values in this interval not displayed.   Basic Metabolic  Panel: Recent Labs  Lab 09/16/19 0505 09/17/19 0452 09/18/19 0305 09/19/19 0939 09/20/19 0337  NA 143 140 141 140 142  K 3.6 3.6 4.0 4.7 3.5  CL 98 100 99 101 100  CO2 27 28 24  21* 24  GLUCOSE 136* 112* 99 88 124*  BUN 64* 85* 98* 47* 61*  CREATININE 6.30* 8.50* 10.04* 6.49* 7.91*  CALCIUM 8.8* 8.6* 8.6* 8.5* 8.7*  MG 2.2 2.4 2.6* 2.2 2.1  PHOS 4.3 5.8* 7.4* 5.8* 7.4*   GFR: Estimated Creatinine Clearance: 6.3 mL/min (A) (by C-G formula based on SCr of 7.91 mg/dL (H)). Liver Function Tests:  Recent Labs  Lab 09/14/19 0303  AST 37  ALT 10  ALKPHOS 63  BILITOT 0.5  PROT 6.9  ALBUMIN 2.1*   No results for input(s): LIPASE, AMYLASE in the last 168 hours. No results for input(s): AMMONIA in the last 168 hours. Coagulation Profile: No results for input(s): INR, PROTIME in the last 168 hours. Cardiac Enzymes: No results for input(s): CKTOTAL, CKMB, CKMBINDEX, TROPONINI in the last 168 hours. BNP (last 3 results) No results for input(s): PROBNP in the last 8760 hours. HbA1C: No results for input(s): HGBA1C in the last 72 hours. CBG: Recent Labs  Lab 09/19/19 1136 09/19/19 1653 09/19/19 2003 09/20/19 0829 09/20/19 1141  GLUCAP 115* 129* 121* 115* 100*   Lipid Profile: No results for input(s): CHOL, HDL, LDLCALC, TRIG, CHOLHDL, LDLDIRECT in the last 72 hours. Thyroid Function Tests: No results for input(s): TSH, T4TOTAL, FREET4, T3FREE, THYROIDAB in the last 72 hours. Anemia Panel: No results for input(s): VITAMINB12, FOLATE, FERRITIN, TIBC, IRON, RETICCTPCT in the last 72 hours. Sepsis Labs: Recent Labs  Lab 09/14/19 0303 09/15/19 0433  PROCALCITON 5.86 4.91    No results found for this or any previous visit (from the past 240 hour(s)).       Radiology Studies: No results found.      Scheduled Meds: . B-complex with vitamin C  1 tablet Per Tube QHS  . chlorhexidine  15 mL Mouth/Throat QID  . epoetin (EPOGEN/PROCRIT) injection  10,000 Units Intravenous Q M,W,F-HD  . feeding supplement (NEPRO CARB STEADY)  1,000 mL Oral Q24H  . feeding supplement (PRO-STAT SUGAR FREE 64)  30 mL Per Tube BID  . free water  50 mL Per Tube Q4H  . heparin injection (subcutaneous)  5,000 Units Subcutaneous Q12H  . mouth rinse  15 mL Mouth Rinse BID   Continuous Infusions: . sodium chloride    . sodium chloride    . dextrose 30 mL/hr at 09/19/19 1500  . famotidine (PEPCID) IV 20 mg (09/19/19 1859)  . lacosamide (VIMPAT) IV 100 mg (09/20/19 1125)    Assessment &  Plan:   Principal Problem:   Dysphagia Active Problems:   Anemia in chronic kidney disease   COPD (chronic obstructive pulmonary disease) (HCC)   CAD in native artery   Benign essential HTN   Acid reflux   Seizure (HCC)   Status post total hip replacement, left   Type II diabetes mellitus with renal manifestations (HCC)   ESRD on dialysis ([place])   Aspiration pneumonia ([place])   Acute encephalopathy   Goals of care, counseling/discussion   Dysphagia: Patient removed her NG tube again and nursing staff could not replace it. --GI consulted, plan for PEG placement today  Aspiration pneumonia:  Initially treated with IVcefazolin, later on transition to IV Unasyn. The patient had recurrent episode of aspiration on of 09/13/2019 with fever, wastransitionedback to Zosyn.  ContinueZosyn until3/2 - completed.    Acute toxic metabolic encephalopathy: Improving - slowly.  Waxes and wanes, usually worse after dialysis.  Chart review shows that patient has had similar episodes of encephalopathyin the past and requiredseveral days to recover. --History of seizures - Continue Vimpat.  --Continue seizure precaution. --Appreciate neurology assistance. EEG unremarkable. --Avoidcephalosporins.  Acute hypoxemic and hypercapnic respiratory failure: Resolved.  Extubated on 09/08/2019.  She iscurrently on room air. Patient was briefly on NRB while she was at HD on 09/13/2019 but that is likely from coiling of the NG tube.  ESRD on hemodialysis:  Follow-up with nephrologist for hemodialysis. Per neurology uremia can also contribute to patient's encephalopathy. Schedule days are Mondays, Wednesdays and Fridays Per nephrology,Prior to discharge, patient needs to be able to sit in the chair  Chronic diastolic CHF:  Hemodialysis for fluid management.  2D echo on 09/07/2019 showed EF estimated at 60 to 03%, grade 1 diastolic dysfunction. Euvolemic on exam  Leukocytosis and  thrombocytopenia: Stable.  Bradycardia: Improved  Suspected ileus:Resolved  MRSA colonization of nares: Completed Bactroban    DVT prophylaxis: Heparin Code Status: DNR Family Communication: None at bedside Disposition Plan: will d/c to SNF pending improvement of encephalopathy, requiring PEG placement for nutrition. Patient requires continued hospital care at this time due to PEG placement for TF for nutrition and as above. Coming from Home Exp date TBD Barriers for d/c: encephalopathy,recurrent aspiration/dsphagia, requiring TF.        LOS: 15 days   Time spent: 57mn with >50% on coc    SNolberto Hanlon MD [facility] Pager [phone]  If 7PM-7AM, please contact night-coverage [email or web address] Password [facility]3/09/2019, 12:41 PM

## 2019-09-20 NOTE — Progress Notes (Signed)
Post HD Tx Note Pt tolerated well the Tx. Blood clotted twice during dialysis. Pt Tx run 3.30mn Prescribed UG goal of 1L was met. Nephrologist is notified. Pt continues to be oriented to self only. Pt reports no chest pain or OSB. BP continued to be WDL during dialysis. AVF WDL post Tx.    09/20/19 2115  Hand-Off documentation  Report given to (Full Name) Dhalia RN   Report received from (Full Name) DNewt MinionRN   Vital Signs  Temp 98.4 F (36.9 C)  Temp Source Axillary  Pulse Rate 87  Resp 16  BP (!) 124/52  BP Location Right Arm  BP Method Automatic  Patient Position (if appropriate) Lying  Oxygen Therapy  SpO2 100 %  O2 Device Nasal Cannula  O2 Flow Rate (L/min) 2 L/min  Post-Hemodialysis Assessment  Rinseback Volume (mL) 250 mL  KECN 55 V  Dialyzer Clearance Lightly streaked  Duration of HD Treatment -hour(s) 3.15 hour(s)  Hemodialysis Intake (mL) 500 mL  UF Total -Machine (mL) 1000 mL  Net UF (mL) 500 mL  Tolerated HD Treatment Yes  AVG/AVF Arterial Site Held (minutes) 7 minutes  AVG/AVF Venous Site Held (minutes) 8 minutes  Fistula / Graft Left Upper arm Arteriovenous vein graft  Placement Date/Time: 02/17/17 1111   Orientation: Left  Access Location: Upper arm  Access Type: Arteriovenous vein graft  Site Condition No complications  Fistula / Graft Assessment Present;Thrill;Bruit  Status Deaccessed  Needle Size 15  Drainage Description None

## 2019-09-20 NOTE — Progress Notes (Signed)
HD Tx completed    09/20/19 2100  Vital Signs  Temp 98.4 F (36.9 C)  Temp Source Axillary  Pulse Rate 89  Resp (!) 21  BP (!) 109/54  Oxygen Therapy  SpO2 100 %  O2 Device Nasal Cannula  O2 Flow Rate (L/min) 2 L/min  During Hemodialysis Assessment  Blood Flow Rate (mL/min) 380 mL/min  Arterial Pressure (mmHg) -180 mmHg  Venous Pressure (mmHg) 280 mmHg  Transmembrane Pressure (mmHg) 50 mmHg  Ultrafiltration Rate (mL/min) 350 mL/min  Dialysate Flow Rate (mL/min) 800 ml/min  Conductivity: Machine  14  HD Safety Checks Performed Yes  Intra-Hemodialysis Comments Tx completed;Tolerated well

## 2019-09-20 NOTE — Progress Notes (Signed)
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  Ectopy Unifocal PVC's  Ectopy Frequency Occasional  Antiarrhythmic device No  Vascular  R Radial Pulse +2  L Radial Pulse +2  R Dorsalis Pedis Pulse +1  L Dorsalis Pedis Pulse +1  Edema Generalized;Right upper extremity;Left upper extremity  Generalized Edema +1  RUE Edema Non-pitting  LUE Edema Non-pitting  Integumentary  Skin Color Appropriate for ethnicity  Skin Condition Dry  Skin Integrity Surgical Incision (see LDA)  Musculoskeletal  Musculoskeletal (WDL) X  Generalized Weakness Yes  Gastrointestinal  Bowel Sounds Assessment Active  Last BM Date 09/20/19  GU Assessment  Genitourinary (WDL) X  Genitourinary Symptoms Anuria  Psychosocial  Psychosocial (WDL) X  Patient Behaviors Cooperative;Uncooperative;Aggressive verbally  Needs Expressed Denies  Emotional support given Given to patient

## 2019-09-20 NOTE — Progress Notes (Signed)
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 AVB  Ectopy Unifocal PVC's  Ectopy Frequency Occasional  Antiarrhythmic device No  Vascular  R Radial Pulse +2  L Radial Pulse +2  R Dorsalis Pedis Pulse +1  L Dorsalis Pedis Pulse +1  Edema Generalized;Right upper extremity;Left upper extremity  Generalized Edema +1  RUE Edema Non-pitting  LUE Edema Non-pitting  Integumentary  Skin Color Appropriate for ethnicity  Skin Condition Dry  Skin Integrity Surgical Incision (see LDA)  Musculoskeletal  Musculoskeletal (WDL) X  Generalized Weakness Yes  Gastrointestinal  Bowel Sounds Assessment Active  Last BM Date 09/20/19  GU Assessment  Genitourinary (WDL) X  Genitourinary Symptoms Anuria  Psychosocial  Psychosocial (WDL) X  Patient Behaviors Cooperative;Uncooperative;Aggressive verbally  Needs Expressed Denies  Emotional support given Given to patient

## 2019-09-20 NOTE — Interval H&P Note (Signed)
History and Physical Interval Note:  09/20/2019 12:33 PM  Barbara [name]  has presented today for surgery, with the diagnosis of Dysphagia, aspiration pneumonia.  The various methods of treatment have been discussed with the patient and family. After consideration of risks, benefits and other options for treatment, the patient has consented to  Procedure(s): PERCUTANEOUS ENDOSCOPIC GASTROSTOMY (PEG) PLACEMENT (N/A) as a surgical intervention.  The patient's history has been reviewed, patient examined, no change in status, stable for surgery.  I have reviewed the patient's chart and labs.  Questions were answered to the patient's satisfaction.     [place], [place]

## 2019-09-20 NOTE — Anesthesia Postprocedure Evaluation (Signed)
Anesthesia Post Note  Patient: Barbara [name]  Procedure(s) Performed: PERCUTANEOUS ENDOSCOPIC GASTROSTOMY (PEG) PLACEMENT (N/A )  Patient location during evaluation: PACU Anesthesia Type: General Level of consciousness: awake and alert Pain management: pain level controlled Vital Signs Assessment: post-procedure vital signs reviewed and stable Respiratory status: spontaneous breathing, nonlabored ventilation, respiratory function stable and patient connected to nasal cannula oxygen Cardiovascular status: blood pressure returned to baseline and stable Postop Assessment: no apparent nausea or vomiting Anesthetic complications: no     Last Vitals:  Vitals:   09/20/19 2100 09/20/19 2115  BP:    Pulse:    Resp:    Temp: 36.9 C 36.9 C  SpO2:      Last Pain:  Vitals:   09/20/19 2115  TempSrc: Axillary  PainSc:                  Martha Clan

## 2019-09-20 NOTE — Op Note (Signed)
[facility] Gastroenterology Patient Name: Barbara [name] Procedure Date: 09/20/2019 1:57 PM MRN: [id number] Account #: [id number] Date of Birth: Jul 07, 1941 Admit Type: Outpatient Age: 79 Room: [facility] ENDO ROOM 4 Gender: Female Note Status: Finalized Procedure:             Upper GI endoscopy Indications:           Place PEG due to dysphagia Providers:             Benay Pike. Alice Reichert MD, MD, Lilla Shook. Cro[place], PA Medicines:             Propofol per Anesthesia Complications:         No immediate complications. Estimated blood loss:                         Minimal. Procedure:             Pre-Anesthesia Assessment:                        - The risks and benefits of the procedure and the                         sedation options and risks were discussed with the                         patient. All questions were answered and informed                         consent was obtained.                        - Patient identification and proposed procedure were                         verified prior to the procedure by the nurse. The                         procedure was verified in the pre-procedure area in                         the procedure room.                        - After reviewing the risks and benefits, the patient                         was deemed in satisfactory condition to undergo the                         procedure.                        - ASA Grade Assessment: IV - A patient with severe                         systemic disease that is a constant threat to life.                        After obtaining informed consent, the endoscope was  passed under direct vision. Throughout the procedure,                         the patient's blood pressure, pulse, and oxygen                         saturations were monitored continuously. The Endoscope                         was introduced through the mouth, and advanced to the   third part of duodenum. The upper GI endoscopy was                         accomplished without difficulty. The patient tolerated                         the procedure well. Findings:      The examined esophagus was normal.      The entire examined stomach was normal.      The examined duodenum was normal. The patient was placed in the supine       position for PEG placement. The stomach was insufflated to appose       gastric and abdominal walls. A site was located in the body of the       stomach with excellent transillumination and manual external pressure       for placement. The abdominal wall was marked and prepped in a sterile       manner. The area was anesthetized with 3 mL of 1% lidocaine. The trocar       needle was introduced through the abdominal wall and into the stomach       under direct endoscopic view. A snare was introduced through the       endoscope and opened in the gastric lumen. The guide wire was passed       through the trocar and into the open snare. The snare was closed around       the guide wire. The endoscope and snare were removed, pulling the wire       out through the mouth. A skin incision was made at the site of needle       insertion. The externally removable 20 Fr EndoVive Safety gastrostomy       tube was lubricated. The G-tube was tied to the guide wire and pulled       through the mouth and into the stomach. The trocar needle was removed,       and the gastrostomy tube was pulled out from the stomach through the       skin. The external bumper was attached to the gastrostomy tube, and the       tube was cut to remove the guide wire. The final position of the       gastrostomy tube was confirmed by relook endoscopy, and skin marking       noted to be 2 cm at the external bumper. The final tension and       compression of the abdominal wall by the PEG tube and external bumper       were checked and revealed that the bumper was moderately tight and        mildly deforming the skin. The feeding tube was capped, and the tube  site cleaned and dressed. Estimated blood loss was minimal. Impression:            - Normal esophagus.                        - Normal stomach.                        - Normal examined duodenum.                        - An externally removable PEG placement was                         successfully completed.                        - No specimens collected. Recommendation:        - Return patient to hospital ward for ongoing care.                        - The findings and recommendations were discussed with                         the patient's family.                        - [Diet Recommendation] Tube feeds per Nutritionist. Procedure Code(s):     --- Professional ---                        [phone], Esophagogastroduodenoscopy, flexible,                         transoral; with directed placement of percutaneous                         gastrostomy tube Diagnosis Code(s):     --- Professional ---                        Z43.1, Encounter for attention to gastrostomy                        R13.10, Dysphagia, unspecified CPT copyright 2019 American Medical Association. All rights reserved. The codes documented in this report are preliminary and upon coder review may  be revised to meet current compliance requirements. Efrain Sella MD, MD 09/20/2019 3:37:36 PM This report has been signed electronically. Lilla Shook. [place], [place] Number of Addenda: 0 Note Initiated On: 09/20/2019 1:57 PM Estimated Blood Loss:  Estimated blood loss was minimal. Estimated blood loss                         was minimal.      [facility]

## 2019-09-20 NOTE — Progress Notes (Signed)
Pt from  pacu  From having peg tube placed. tol well. Vss. Pt sleeping but  Arouses when name called. 02 2l [place]. Pt stable for dialysis.  Sent to dialysis with bed  And rn dina louis,

## 2019-09-21 ENCOUNTER — Encounter: Payer: Self-pay | Admitting: *Deleted

## 2019-09-21 DIAGNOSIS — I2510 Atherosclerotic heart disease of native coronary artery without angina pectoris: Secondary | ICD-10-CM

## 2019-09-21 LAB — GLUCOSE, CAPILLARY
Glucose-Capillary: 106 mg/dL — ABNORMAL HIGH (ref 70–99)
Glucose-Capillary: 107 mg/dL — ABNORMAL HIGH (ref 70–99)
Glucose-Capillary: 112 mg/dL — ABNORMAL HIGH (ref 70–99)
Glucose-Capillary: 112 mg/dL — ABNORMAL HIGH (ref 70–99)
Glucose-Capillary: 114 mg/dL — ABNORMAL HIGH (ref 70–99)
Glucose-Capillary: 114 mg/dL — ABNORMAL HIGH (ref 70–99)
Glucose-Capillary: 121 mg/dL — ABNORMAL HIGH (ref 70–99)
Glucose-Capillary: 98 mg/dL (ref 70–99)

## 2019-09-21 NOTE — TOC Progression Note (Signed)
Transition of Care [facility]) - Progression Note    Patient Details  Name: Barbara [name] MRN: [id number] Date of Birth: 1941/02/06  [facility]) CM/SW Contact  Barbara [name], Gardiner Rhyme, LCSW Phone Number: 09/21/2019, 2:20 PM  Clinical Narrative:   Spoke with [employer] for auth they have approved pt for start date 3/5-3/9-5 days. Ref number [id number]. CM [employer] fax [phone]. Will inform [facility] of approval. RN to place COVID test ordered by MD but not done yet.    Expected Discharge Plan: [place] Barriers to Discharge: Continued Medical Work up  Expected Discharge Plan and Services Expected Discharge Plan: [place] In-house Referral: Clinical Social Work     Living arrangements for the past 2 months: Single Family Home                                       Social Determinants of Health (SDOH) Interventions    Readmission Risk Interventions No flowsheet data found.

## 2019-09-21 NOTE — Progress Notes (Signed)
PROGRESS NOTE    Barbara [name]  YIR:[id number] DOB: 1940-09-21 DOA: 09/05/2019 PCP: [name], MD    Brief Narrative:  Barbara Bongo Conyersis an 79 y.o.femalemedical history significant of hypertension, hyperlipidemia, diet-controlled diabetes, COPD, asthma, GERD, ESRD-HD (MWF), OSA, CAD, bladder cancer, iron deficiency anemia. She was admitted by Dr. Kizzie Bane for lefttotal hip arthroplasty. She developed acute respiratory distress after surgery. She was subsequently transferred to the ICU for acute hypoxemic and hypercapnic respiratory failure. Initially, she was treated with BiPAP. However, she failed BiPAP therapy and required intubation and mechanical ventilation in the ICU. She was also treated with IV antibiotics for aspiration pneumonia.  She was also seen by the nephrologist for end-stage renal disease. She was initially placed on CRRT and then transition to hemodialysis. She required vasopressors for hypotension.  She was seen by the neurologist for altered mental status and seizure activity. MRI brain did not show any acute stroke. Neurologist recommended Vimpat and monitoring for seizure  She was also seen by the cardiologist because of bradycardia and sinus pauses on telemetry. Per cardiologist, there was no indication for permanent pacemaker and he recommended a conservative approach.  She was extubated on 09/08/2019. Her condition was slowly improving so she was was transferred to the medical floor for continued medical management.     Consultants:   GI, nephrology  Procedures: Peg Tube  Antimicrobials:   zosyn   Subjective: Patient is awake today.  More interactive.  Has no complaints.  Had a little mild pain at the PEG site previously.  Objective: Vitals:   09/20/19 2100 09/20/19 2115 09/20/19 2359 09/21/19 0500  BP: (!) 109/54 (!) 124/52 (!) 125/48   Pulse: 89 87 74   Resp: (!) 21 16 14    Temp: 98.4 F (36.9 C) 98.4 F (36.9 C) 97.8 F (36.6  C)   TempSrc: Axillary Axillary Oral   SpO2: 100% 100% 100%   Weight:    78.2 kg  Height:        Intake/Output Summary (Last 24 hours) at 09/21/2019 0738 Last data filed at 09/20/2019 2115 Gross per 24 hour  Intake 375 ml  Output 504 ml  Net -129 ml   Filed Weights   09/20/19 1339 09/20/19 1700 09/21/19 0500  Weight: 83.6 kg 83.6 kg 78.2 kg    Examination:  General exam: Appears calm and comfortable in bed. Not very interactive. Respiratory system: Clear to auscultation anteriorly with poor respiratory effort normal. Cardiovascular system: S1 & S2 heard, RRR. No murmurs, rubs, gallops or clicks. Gastrointestinal system: Abdomen is nondistended, soft and nontender. Normal bowel sounds heard.PEG tube in place Central nervous system: awake and more alert today, cooperative with exam Extremities: no edema Skin: warm, dry     Data Reviewed: I have personally reviewed following labs and imaging studies  CBC: Recent Labs  Lab 09/16/19 0505 09/17/19 0452 09/18/19 0305 09/19/19 0939 09/20/19 0337  WBC 15.9* 13.7* 12.8* 10.7* 12.2*  HGB 8.8* 8.5* 8.6* 10.1* 8.9*  HCT 29.4* 28.6* 29.1* 34.4* 29.8*  MCV 102.4* 102.5* 102.5* 103.3* 101.4*  PLT 428* 483* 527* 429* 500*   Basic Metabolic Panel: Recent Labs  Lab 09/16/19 0505 09/17/19 0452 09/18/19 0305 09/19/19 0939 09/20/19 0337  NA 143 140 141 140 142  K 3.6 3.6 4.0 4.7 3.5  CL 98 100 99 101 100  CO2 27 28 24  21* 24  GLUCOSE 136* 112* 99 88 124*  BUN 64* 85* 98* 47* 61*  CREATININE 6.30* 8.50* 10.04* 6.49* 7.91*  CALCIUM 8.8* 8.6* 8.6* 8.5* 8.7*  MG 2.2 2.4 2.6* 2.2 2.1  PHOS 4.3 5.8* 7.4* 5.8* 7.4*   GFR: Estimated Creatinine Clearance: 6.1 mL/min (A) (by C-G formula based on SCr of 7.91 mg/dL (H)). Liver Function Tests: No results for input(s): AST, ALT, ALKPHOS, BILITOT, PROT, ALBUMIN in the last 168 hours. No results for input(s): LIPASE, AMYLASE in the last 168 hours. No results for input(s): AMMONIA in  the last 168 hours. Coagulation Profile: No results for input(s): INR, PROTIME in the last 168 hours. Cardiac Enzymes: No results for input(s): CKTOTAL, CKMB, CKMBINDEX, TROPONINI in the last 168 hours. BNP (last 3 results) No results for input(s): PROBNP in the last 8760 hours. HbA1C: No results for input(s): HGBA1C in the last 72 hours. CBG: Recent Labs  Lab 09/20/19 1557 09/20/19 1646 09/20/19 2215 09/21/19 0001 09/21/19 0345  GLUCAP 98 114* 127* 112* 106*   Lipid Profile: No results for input(s): CHOL, HDL, LDLCALC, TRIG, CHOLHDL, LDLDIRECT in the last 72 hours. Thyroid Function Tests: No results for input(s): TSH, T4TOTAL, FREET4, T3FREE, THYROIDAB in the last 72 hours. Anemia Panel: No results for input(s): VITAMINB12, FOLATE, FERRITIN, TIBC, IRON, RETICCTPCT in the last 72 hours. Sepsis Labs: Recent Labs  Lab 09/15/19 0433  PROCALCITON 4.91    No results found for this or any previous visit (from the past 240 hour(s)).       Radiology Studies: No results found.      Scheduled Meds: . B-complex with vitamin C  1 tablet Per Tube QHS  . chlorhexidine  15 mL Mouth/Throat QID  . epoetin (EPOGEN/PROCRIT) injection  10,000 Units Intravenous Q Barbara,W,F-HD  . feeding supplement (NEPRO CARB STEADY)  1,000 mL Oral Q24H  . feeding supplement (PRO-STAT SUGAR FREE 64)  30 mL Per Tube BID  . free water  50 mL Per Tube Q4H  . heparin injection (subcutaneous)  5,000 Units Subcutaneous Q12H  . loperamide HCl  4 mg Oral Once  . mouth rinse  15 mL Mouth Rinse BID   Continuous Infusions: . sodium chloride    . dextrose 30 mL/hr at 09/19/19 1500  . famotidine (PEPCID) IV 20 mg (09/19/19 1859)  . lacosamide (VIMPAT) IV Stopped (09/20/19 2331)    Assessment & Plan:   Principal Problem:   Dysphagia Active Problems:   Anemia in chronic kidney disease   COPD (chronic obstructive pulmonary disease) (HCC)   CAD in native artery   Benign essential HTN   Acid reflux    Convulsions (HCC)   Status post total hip replacement, left   Type II diabetes mellitus with renal manifestations (HCC)   ESRD on dialysis ([place])   Aspiration pneumonia ([place])   Acute encephalopathy   Goals of care, counseling/discussion   Dysphagia: S/p PEG tube placement on 09/20/19 Nutrition consult for feeding  Aspiration pneumonia:  Initially treated with IVcefazolin, later on transition to IV Unasyn. The patient had recurrent episode of aspiration on of 09/13/2019 with fever, wastransitionedback to Zosyn. ContinueZosyn until3/2 - completed.    Acute toxic metabolic encephalopathy: Improving - slowly.  Waxes and wanes, usually worse after dialysis.  Chart review shows that patient has had similar episodes of encephalopathyin the past and requiredseveral days to recover. --History of seizures - Continue Vimpat.  --Continue seizure precaution. --Appreciate neurology assistance. EEG unremarkable. --Avoidcephalosporins.  Acute hypoxemic and hypercapnic respiratory failure: Resolved.  Extubated on 09/08/2019.  She iscurrently on room air. Patient was briefly on NRB while she was at HD on 09/13/2019  but that is likely from coiling of the NG tube.  Status post elective left total hip arthroplasty Performed on September 05, 2019 by Dr. Roland Rack  ESRD on hemodialysis: Monday Wednesday Friday Follow-up with nephrologist for hemodialysis. Per neurology uremia can also contribute to patient's encephalopathy. Per nephrology,Prior to discharge, patient needs to be able to sit in the chair  Chronic diastolic CHF:  Hemodialysis for fluid management.  2D echo on 09/07/2019 showed EF estimated at 60 to 61%, grade 1 diastolic dysfunction. Euvolemic on exam  Leukocytosis and thrombocytopenia: Stable.  Bradycardia: Improved  Suspected ileus:Resolved  MRSA colonization of nares: Completed Bactroban    DVT prophylaxis: Heparin Code Status: DNR Family Communication:  None at bedside Disposition Plan: will d/c to SNF pending authorization at [facility] resource Will check covid test today. Nutritiion was consulted Exp date to d/c: 09/22/19 Barriers for d/c: snf authorization, covid testing, nutrition consult for TF        LOS: 16 days   Time spent: 64mn with >50% on coc    SNolberto Hanlon MD [facility] Pager [phone]  If 7PM-7AM, please contact night-coverage [email or web address] Password [facility]3/10/2019, 7:38 AM Patient ID: [id number] [name] female   DOB: [birth year] 79y.o.   MRN: [id number]

## 2019-09-21 NOTE — Progress Notes (Signed)
GI Inpatient Follow-up Note  Subjective:  Patient seen in follow-up for dysphagia s/p PEG tube placement yesterday afternoon by Dr. Alice Reichert and myself. There were no post-procedure complications. No acute events overnight. She received hemodialysis last night to remain on her MWF schedule. Per nursing, PEG tube feedings have been initiated and PEG is functioning well. Pt is more interactive today compared to previous days. She is able to recall the name of her stuffed animal - Zoe and recall her 80-year old great-grandchild.   Scheduled Inpatient Medications:  . B-complex with vitamin C  1 tablet Per Tube QHS  . chlorhexidine  15 mL Mouth/Throat QID  . epoetin (EPOGEN/PROCRIT) injection  10,000 Units Intravenous Q M,W,F-HD  . feeding supplement (NEPRO CARB STEADY)  1,000 mL Oral Q24H  . feeding supplement (PRO-STAT SUGAR FREE 64)  30 mL Per Tube BID  . free water  50 mL Per Tube Q4H  . heparin injection (subcutaneous)  5,000 Units Subcutaneous Q12H  . loperamide HCl  4 mg Oral Once  . mouth rinse  15 mL Mouth Rinse BID    Continuous Inpatient Infusions:   . sodium chloride    . dextrose 30 mL/hr at 09/19/19 1500  . famotidine (PEPCID) IV 20 mg (09/19/19 1859)  . lacosamide (VIMPAT) IV 100 mg (09/21/19 0920)    PRN Inpatient Medications:  acetaminophen (TYLENOL) oral liquid 160 mg/5 mL, acetaminophen, acetaminophen, bisacodyl, hydrALAZINE, ipratropium-albuterol, ondansetron **OR** ondansetron (ZOFRAN) IV  Review of Systems:  Unable to obtain due to patient's mental status    Physical Examination: BP (!) 148/62 (BP Location: Right Arm)   Pulse 68   Temp 98.7 F (37.1 C) (Oral)   Resp 17   Ht 5' 6"  (1.676 m)   Wt 78.2 kg   SpO2 97%   BMI 27.83 kg/m  Gen: NAD, alert to self and place HEENT: PEERLA, EOMI, Neck: supple, no JVD or thyromegaly Chest: CTA bilaterally, no wheezes, crackles, or other adventitious sounds CV: RRR, no m/g/c/r Abd: soft, NT, ND, +BS in all four  quadrants; no HSM, guarding, ridigity, or rebound tenderness. PEG tube in place and without any surrounding induration, erythema, or discharge Ext: no edema, well perfused with 2+ pulses, Skin: no rash or lesions noted Lymph: no LAD  Data: Lab Results  Component Value Date   WBC 12.2 (H) 09/20/2019   HGB 8.9 (L) 09/20/2019   HCT 29.8 (L) 09/20/2019   MCV 101.4 (H) 09/20/2019   PLT 479 (H) 09/20/2019   Recent Labs  Lab 09/18/19 0305 09/19/19 0939 09/20/19 0337  HGB 8.6* 10.1* 8.9*   Lab Results  Component Value Date   NA 142 09/20/2019   K 3.5 09/20/2019   CL 100 09/20/2019   CO2 24 09/20/2019   BUN 61 (H) 09/20/2019   CREATININE 7.91 (H) 09/20/2019   Lab Results  Component Value Date   ALT 10 09/14/2019   AST 37 09/14/2019   ALKPHOS 63 09/14/2019   BILITOT 0.5 09/14/2019   No results for input(s): APTT, INR, PTT in the last 168 hours. Assessment/Plan:  79 y/o AA female with a PMH of HTN, HLD, diet-controlled diabetes, COPD, asthma, GERD, ESRD-HD (MWF), OSA, CAD, Hx of bladder cancer, and IDA admitted for total hip arthroplasty complicated by acute hypoxic respiratory failure s/p intubation and mechanical ventilation. Pt extubated 02/19. GI consulted for poor po intake and dysphagia.   1. Dysphagia s/p PEG Tube Placement 09/20/2019 2. Aspiration pneumonia 3. Acute toxic metabolic encephalopathy 4. Multiple medical comorbidities  5. DNR Status   - PEG Tube placed yesterday by Dr. Alice Reichert and myself - PEG is functioning well with no complications such as bleeding, erythema, or induration. External bumper moderately tight to skin. - Continue PEG tube feedings per nutritionist  - There are plans for patient to be discharged to SNF. She should follow-up with speech therapy there - No indication for further GI evaluation. PEG is functioning appropriately. - Spoke with patient's husband about PEG placement and answered his questions regarding PEG - GI will sign off at  this time. Please call us back if we are needed.    Please call with questions or concerns.    Octavia Bruckner, PA-C [facility] [phone] (Cell)

## 2019-09-21 NOTE — Progress Notes (Signed)
Nutrition Follow-up  RD working remotely.  DOCUMENTATION CODES:   Not applicable  INTERVENTION:  Resume Nepro at 40 mL/hr (960 mL goal daily volume) + Pro-Stat 30 mL BID per PEG tube (provides 1928 kcal, 108 grams of protein, 701 mL H2O daily).  Continue free water flush of50 mL Q4hrs.  Continue B-complex with C QHS per tube.  NUTRITION DIAGNOSIS:   Inadequate oral intake related to inability to eat as evidenced by NPO status.  Ongoing - addressing with TF regimen.  GOAL:   Patient will meet greater than or equal to 90% of their needs  Met with TF regimen.  MONITOR:   Diet advancement, Labs, Weight trends, TF tolerance, I & O's  REASON FOR ASSESSMENT:   Malnutrition Screening Tool, Ventilator, Consult Enteral/tube feeding initiation and management  ASSESSMENT:   79 year old female with PMHx of COPD, HTN, GERD, CAD, asthma, celiac disease, ESRD on HD, DM, bladder cancer s/p TURBT followed by palliative gemcitabine and concurrent radiation. Patient was admitted for left total hip arthroplasty on 2/16 but on 2/17 developed respiratory distress, metabolic acidosis, and encephalopathy requiring intubation.   Pertinent events: -Patient was extubated on 2/19. -Patient has been unable to eat well following extubation. She had NG tubes placed and replaced several times due to coiling. Family decided to pursue PEG tube placement.  Patient now s/p placement of 20 Fr. Endovive Safety G-tube by GI yesterday. RD received consult for tube feed management. Plan is for patient to discharge to [facility] tomorrow. Patient's diet was advanced to dysphagia 1 with nectar-thick liquids on 3/2 but patient continues to refuse PO intake.  Medications reviewed and include: B-complex with C QHS, Epogen 10000 units during HD, famotidine, Vimpat.  Labs reviewed: CBG 98-127.  Enteral Access: G-tube  I/O: 1 occurrence unmeasured UOP yesterday; 500 mL removed from HD  Weight trend: 78.2 kg  on 3/4; -10.7 kg from admission wt but appears to be in line with patient's UBW prior to admission (78 kg on 08/31/2019)  Diet Order:   Diet Order            Diet NPO time specified  Diet effective midnight             EDUCATION NEEDS:   Not appropriate for education at this time  Skin:  Skin Assessment: Skin Integrity Issues:(closed incision left hip)  Last BM:  09/20/2019 small type 7  Height:   Ht Readings from Last 1 Encounters:  09/20/19 5' 6" (1.676 m)   Weight:   Wt Readings from Last 1 Encounters:  09/21/19 78.2 kg   Ideal Body Weight:  59.1 kg  BMI:  Body mass index is 27.83 kg/m.  Estimated Nutritional Needs:   Kcal:  1700-1900  Protein:  95-110 grams  Fluid:  1.8 L/day   King, MS, RD, LDN Pager number available on Amion  

## 2019-09-21 NOTE — TOC Progression Note (Addendum)
Transition of Care [facility]) - Progression Note    Patient Details  Name: [name] MRN: [id number] Date of Birth: 1941/05/25  Transition of [facility]) CM/SW Contact  Nneoma Harral, Gardiner Rhyme, LCSW Phone Number: 09/21/2019, 3:52 PM  Clinical Narrative:   Have asked if pt can sit in a recliner tomorrow for HD session. Pt will need to sit in a recliner via hoyer to be able sit up for OP dialysis when at [facility]. Emily-PT will check with ortho for issues with hip precautions and using a hoyer. Will see how tomorrow goes in HD in recliner chair. If can not tolerate will need to re-group and come up with a different plan.    Expected Discharge Plan: [place] Barriers to Discharge: Continued Medical Work up  Expected Discharge Plan and Services Expected Discharge Plan: [place] In-house Referral: Clinical Social Work     Living arrangements for the past 2 months: Single Family Home                                       Social Determinants of Health (SDOH) Interventions    Readmission Risk Interventions No flowsheet data found.

## 2019-09-21 NOTE — Progress Notes (Signed)
SLP Cancellation Note  Patient Details Name: Barbara [name] MRN: [id number] DOB: 1941/01/24   Cancelled treatment:       Reason Eval/Treat Not Completed: Patient not medically ready(chart reviewed; pt received PEG placement )  Per chart notes and NSG, pt received PEG placement yesterday, 09/20/2019; d/t decreased oral intake. Pt has been on a Dysphagia level 1 (puree) diet w/ Nectar liquids after Dobhoff was removed w/ No reported overt s/s of aspiration per chart notes. Pt has exhibited increased,ongoing Confusion this admission w/ reduced attention to oral intake; only taking bites/sips w/ meals.  With current PEG placement, recommend follow Dietician guidelines closely re: TFs in conjunction w/ any oral intake of the recommended Dysphagia level 1 (puree) diet w/ Nectar liquids; general aspiration and Reflux precautions. Oral intake/stimulation of swallowing is important in order to maintain attention, awareness, and desire for oral intake. Recommend f/u w/ ST services at SNF b/f any diet upgrade; and education on the impact of Cognitive decline on swallowing and oral intake. Recommend continue frequent oral care for hygiene and stimulation of swallowing; aspiration precautions.     [name], MS, CCC-SLP Karyn Brull 09/21/2019, 11:59 AM

## 2019-09-21 NOTE — TOC Progression Note (Addendum)
Transition of Care [facility]) - Progression Note    Patient Details  Name: Barbara [name] MRN: [id number] Date of Birth: [birth year]  Transition of Care [facility]) CM/SW Contact  Novella Abraha, Gardiner Rhyme, LCSW Phone Number: 09/21/2019, 10:04 AM  Clinical Narrative:  MD reports pt will be ready for transfer to [facility] tomorrow. Will need another COVID test and will go to first run dialysis prior to transfer to [facility] tomorrow. Will contact husband and daughter-Edna regarding transfer. Have started insurance auth with [facility]. Ref number [id number]. Amanda-HD liaison aware of the plan for tomorrow.    10:17 am spoke with husband and message left for daughter-Edna. To make aware of plan for tomorrow   Expected Discharge Plan: [place] Barriers to Discharge: Continued Medical Work up  Expected Discharge Plan and Services Expected Discharge Plan: [place] In-house Referral: Clinical Social Work     Living arrangements for the past 2 months: Single Family Home                                       Social Determinants of Health (SDOH) Interventions    Readmission Risk Interventions No flowsheet data found.

## 2019-09-21 NOTE — Progress Notes (Signed)
Physical Therapy Treatment Patient Details Name: Barbara [name] MRN: [id number] DOB: 12-Apr-1941 Today's Date: 09/21/2019    History of Present Illness Per MD:  "79 y.o. female with medical history significant of hypertension, hyperlipidemia, diet-controlled diabetes, COPD, asthma, GERD, ESRD-HD (MWF), OSA, CAD, bladder cancer, iron deficiency anemia.  She was admitted by Dr. Roland Rack for left total hip arthroplasty.  She developed acute respiratory distress after surgery.  She was subsequently transferred to the ICU for acute hypoxemic and hypercapnic respiratory failure.  Initially, she was treated with BiPAP.  However, she failed BiPAP therapy and required intubation and mechanical ventilation in the ICU.  She was also treated with IV antibiotics for aspiration pneumonia." Pt also recieved CRRT and transitioned to HD during this stay, as well as vasopressors for hypotension, MRI performed due to AMS and seizure like activity, MRI negative for acute findings. Cardiology consulted for bradycardia and sinus pauses, no indication for pacemaker, conservative approach. Pt extubated 09/08/2019 and transferred to medical floor.  S/p PEG tube placement 09/20/19.    PT Comments    Pt resting in bed upon PT arrival.  Pt cooperative with therapy session.  Required max assist x2 to sit pt up on edge of bed and 2 assist for sitting balance d/t significant posterior lean in sitting.  Pt only able to sit edge of bed for a couple minutes (pt appearing uncomfortable sitting on L hip and leaning to R side attempting to lay back down so 2 assist provided to safely lay pt back down in bed).  Will continue to focus on strengthening and progressive functional mobility per pt tolerance.    Follow Up Recommendations  SNF     Equipment Recommendations  Rolling walker with 5" wheels;3in1 (PT);Wheelchair (measurements PT);Wheelchair cushion (measurements PT);Hospital bed    Recommendations for Other Services OT consult      Precautions / Restrictions Precautions Precautions: Posterior Hip;Fall Precaution Booklet Issued: Yes (comment) Precaution Comments: mits; aspiration; PEG tube Restrictions Weight Bearing Restrictions: Yes LLE Weight Bearing: Weight bearing as tolerated    Mobility  Bed Mobility Overal bed mobility: Needs Assistance Bed Mobility: Supine to Sit;Sit to Supine     Supine to sit: Max assist;+2 for physical assistance;HOB elevated Sit to supine: Max assist;+2 for physical assistance;HOB elevated   General bed mobility comments: 2 assist for trunk and B LE's semi-supine to/from sitting; vc's to assist  Transfers                 General transfer comment: Not appropriate at this time d/t assist required for sitting balance  Ambulation/Gait                 Stairs             Wheelchair Mobility    Modified Rankin (Stroke Patients Only)       Balance Overall balance assessment: Needs assistance Sitting-balance support: Bilateral upper extremity supported;Feet supported Sitting balance-Leahy Scale: Poor Sitting balance - Comments: Significant posterior lean in sitting requiring max assist x1 plus min to mod assist of 2nd to maintain sitting balance Postural control: Posterior lean                                  Cognition Arousal/Alertness: Awake/alert Behavior During Therapy: Flat affect Overall Cognitive Status: Impaired/Different from baseline Area of Impairment: Following commands;Safety/judgement  Following Commands: Follows one step commands inconsistently Safety/Judgement: Decreased awareness of deficits;Decreased awareness of safety            Exercises      General Comments General comments (skin integrity, edema, etc.): PEG tube site intact beginning/end of session.  Pt agreeable to PT session.      Pertinent Vitals/Pain Pain Assessment: Faces Faces Pain Scale: Hurts a little bit Pain  Location: L hip with movement Pain Descriptors / Indicators: Grimacing;Guarding Pain Intervention(s): Limited activity within patient's tolerance;Monitored during session;Repositioned  Vitals (HR and O2 on room air) stable and WFL throughout treatment session.    Home Living                      Prior Function            PT Goals (current goals can now be found in the care plan section) Acute Rehab PT Goals Patient Stated Goal: unable to state PT Goal Formulation: With patient Time For Goal Achievement: 09/28/19 Potential to Achieve Goals: Fair Progress towards PT goals: Progressing toward goals    Frequency    Min 2X/week      PT Plan Current plan remains appropriate    Co-evaluation              AM-PAC PT "6 Clicks" Mobility   Outcome Measure  Help needed turning from your back to your side while in a flat bed without using bedrails?: Total Help needed moving from lying on your back to sitting on the side of a flat bed without using bedrails?: Total Help needed moving to and from a bed to a chair (including a wheelchair)?: Total Help needed standing up from a chair using your arms (e.g., wheelchair or bedside chair)?: Total Help needed to walk in hospital room?: Total Help needed climbing 3-5 steps with a railing? : Total 6 Click Score: 6    End of Session Equipment Utilized During Treatment: Oxygen Activity Tolerance: Patient tolerated treatment well Patient left: in bed;with call bell/phone within reach;with bed alarm set;Other (comment)(pillows placed between pt's knees for posterior hip precautions and B heels floating via pillows) Nurse Communication: Mobility status;Precautions PT Visit Diagnosis: Muscle weakness (generalized) (M62.81);Difficulty in walking, not elsewhere classified (R26.2);Other abnormalities of gait and mobility (R26.89);Pain Pain - Right/Left: Left Pain - part of body: Hip     Time: 1683-7290 PT Time Calculation (min)  (ACUTE ONLY): 27 min  Charges:  $Therapeutic Activity: 23-37 mins                     Leitha Bleak, PT 09/22/19, 8:14 AM

## 2019-09-21 NOTE — Care Management Important Message (Signed)
Important Message  Patient Details  Name: Barbara [name] MRN: [id number] Date of Birth: 1941-05-22   Medicare Important Message Given:  Yes     Barbara [name] 09/21/2019, 11:04 AM

## 2019-09-21 NOTE — Progress Notes (Signed)
[facility]  ROUNDING NOTE   Subjective:   Underwent PEG tube placement on Wednesday.  Subsequently was dialyzed This morning, she still feels that she is not doing good Husband is at bedside Able to recognize her husband and have some conversation with him   Objective:  Vital signs in last 24 hours:  Temp:  [97.3 F (36.3 C)-98.7 F (37.1 C)] 98.7 F (37.1 C) (03/04 0753) Pulse Rate:  [66-93] 68 (03/04 0753) Resp:  [14-26] 17 (03/04 0753) BP: (109-148)/(46-80) 148/62 (03/04 0753) SpO2:  [96 %-100 %] 97 % (03/04 0753) Weight:  [78.2 kg-83.6 kg] 78.2 kg (03/04 0500)  Weight change:  Filed Weights   09/20/19 1339 09/20/19 1700 09/21/19 0500  Weight: 83.6 kg 83.6 kg 78.2 kg    Intake/Output: I/O last 3 completed shifts: In: 375 [I.V.:375] Out: 504 [Other:500; Blood:4]   Intake/Output this shift:  No intake/output data recorded.   Physical Exam: General:  Laying in the bed, no acute distress  HEENT:  Anicteric, moist oral mucous membranes  Lungs:   Normal breathing effort  Heart:  No rub  Abdomen:   Soft, nontender, PEG tube in place  Extremities:  No peripheral edema  Neurologic:  Sleepy but able to follow commands  Skin:  Warm  Access:  Left arm AV graft    Basic Metabolic Panel: Recent Labs  Lab 09/16/19 0505 09/16/19 0505 09/17/19 0452 09/17/19 0452 09/18/19 0305 09/19/19 0939 09/20/19 0337  NA 143  --  140  --  141 140 142  K 3.6  --  3.6  --  4.0 4.7 3.5  CL 98  --  100  --  99 101 100  CO2 27  --  28  --  24 21* 24  GLUCOSE 136*  --  112*  --  99 88 124*  BUN 64*  --  85*  --  98* 47* 61*  CREATININE 6.30*  --  8.50*  --  10.04* 6.49* 7.91*  CALCIUM 8.8*   < > 8.6*   < > 8.6* 8.5* 8.7*  MG 2.2  --  2.4  --  2.6* 2.2 2.1  PHOS 4.3  --  5.8*  --  7.4* 5.8* 7.4*   < > = values in this interval not displayed.    Liver Function Tests: No results for input(s): AST, ALT, ALKPHOS, BILITOT, PROT, ALBUMIN in the last 168 hours. No  results for input(s): LIPASE, AMYLASE in the last 168 hours. No results for input(s): AMMONIA in the last 168 hours.  CBC: Recent Labs  Lab 09/16/19 0505 09/17/19 0452 09/18/19 0305 09/19/19 0939 09/20/19 0337  WBC 15.9* 13.7* 12.8* 10.7* 12.2*  HGB 8.8* 8.5* 8.6* 10.1* 8.9*  HCT 29.4* 28.6* 29.1* 34.4* 29.8*  MCV 102.4* 102.5* 102.5* 103.3* 101.4*  PLT 428* 483* 527* 429* 479*    Cardiac Enzymes: No results for input(s): CKTOTAL, CKMB, CKMBINDEX, TROPONINI in the last 168 hours.  BNP: Invalid input(s): POCBNP  CBG: Recent Labs  Lab 09/20/19 1646 09/20/19 2215 09/21/19 0001 09/21/19 0345 09/21/19 0754  GLUCAP 114* 127* 112* 106* 98    Microbiology: Results for orders placed or performed during the hospital encounter of 09/05/19  MRSA PCR Screening     Status: Abnormal   Collection Time: 09/06/19  7:50 AM   Specimen: Nasal Mucosa; Nasopharyngeal  Result Value Ref Range Status   MRSA by PCR POSITIVE (A) NEGATIVE Final    Comment:        The  GeneXpert MRSA Assay (FDA approved for NASAL specimens only), is one component of a comprehensive MRSA colonization surveillance program. It is not intended to diagnose MRSA infection nor to guide or monitor treatment for MRSA infections. RESULT CALLED TO, READ BACK BY AND VERIFIED WITH: MEGAN SHEFFIELD AT 0931 ON 09/06/2019 [place]. Performed at [facility], [address] [place], [place] [address]     Coagulation Studies: No results for input(s): LABPROT, INR in the last 72 hours.  Urinalysis: No results for input(s): COLORURINE, LABSPEC, PHURINE, GLUCOSEU, HGBUR, BILIRUBINUR, KETONESUR, PROTEINUR, UROBILINOGEN, NITRITE, LEUKOCYTESUR in the last 72 hours.  Invalid input(s): APPERANCEUR    Imaging: No results found.   Medications:   . sodium chloride    . dextrose 30 mL/hr at 09/19/19 1500  . famotidine (PEPCID) IV 20 mg (09/19/19 1859)  . lacosamide (VIMPAT) IV 100 mg (09/21/19 0920)   .  B-complex with vitamin C  1 tablet Per Tube QHS  . chlorhexidine  15 mL Mouth/Throat QID  . epoetin (EPOGEN/PROCRIT) injection  10,000 Units Intravenous Q M,W,F-HD  . feeding supplement (NEPRO CARB STEADY)  1,000 mL Oral Q24H  . feeding supplement (PRO-STAT SUGAR FREE 64)  30 mL Per Tube BID  . free water  50 mL Per Tube Q4H  . heparin injection (subcutaneous)  5,000 Units Subcutaneous Q12H  . loperamide HCl  4 mg Oral Once  . mouth rinse  15 mL Mouth Rinse BID   acetaminophen (TYLENOL) oral liquid 160 mg/5 mL, acetaminophen, acetaminophen, bisacodyl, hydrALAZINE, ipratropium-albuterol, ondansetron **OR** ondansetron (ZOFRAN) IV  Assessment/ Plan:    Barbara [name] is a 79 y.o. black female with end stage renal disease on hemodialysis, hypertension, diabetes mellitus type II, diabetic neuropathy, seizure disorder who was admitted for left hip repair and unfortunate developed seizure episode postoperatively and subsequently intubated. Required vasopressors, CRRT.   [facility] MWF [facility] LUE AVG 79.5kg   1.  ESRD on HD MWF.  Patient did get her routine hemodialysis treatment on Wednesday evening. Prior to discharge, patient needs to be able to sit in the chair Next hemodialysis planned for Friday morning  2.  Status post elective left total hip arthroplasty Performed on September 05, 2019 by Dr. Roland Rack   3.  Anemia of chronic kidney disease.   Lab Results  Component Value Date   HGB 8.9 (L) 09/20/2019  Continue Epogen with dialysis  4.  Secondary hyperparathyroidism.   Lab Results  Component Value Date   PTH 310 (H) 09/06/2019   CALCIUM 8.7 (L) 09/20/2019   CAION 0.85 (LL) 09/05/2019   PHOS 7.4 (H) 09/20/2019  Monitor closely  5. Encephalopathy: with seizure disorder.   Slow improvement  6.  Acute respiratory failure:  Required intubation this admission Extubated now  7.  Poor p.o. intake Status post PEG tube placement by Dr. Alice Reichert September 20, 2019    LOS:  [place] 3/4/202111:36 AM

## 2019-09-21 NOTE — TOC Progression Note (Signed)
Transition of Care [facility]) - Progression Note    Patient Details  Name: Barbara [name] MRN: [id number] Date of Birth: 1940-11-19  Transition of Care [facility]) CM/SW Contact  Jasiel Belisle, [place] Rhyme, LCSW Phone Number: 09/21/2019, 4:22 PM  Clinical Narrative:   May need to try pt with hoyer while in hospital to see if can be safely done in an OP setting.  Otherwise according to Amanda_HD liaison there is no place that takes stretchers for HD as an OP here in this state. Will need to work on what is safest for pt.    Expected Discharge Plan: [place] Barriers to Discharge: Continued Medical Work up  Expected Discharge Plan and Services Expected Discharge Plan: [place] In-house Referral: Clinical Social Work     Living arrangements for the past 2 months: Single Family Home                                       Social Determinants of Health (SDOH) Interventions    Readmission Risk Interventions No flowsheet data found.

## 2019-09-22 ENCOUNTER — Inpatient Hospital Stay: Payer: Medicare Other

## 2019-09-22 DIAGNOSIS — R1310 Dysphagia, unspecified: Secondary | ICD-10-CM

## 2019-09-22 LAB — CBC
HCT: 26.9 % — ABNORMAL LOW (ref 36.0–46.0)
Hemoglobin: 8.1 g/dL — ABNORMAL LOW (ref 12.0–15.0)
MCH: 30.6 pg (ref 26.0–34.0)
MCHC: 30.1 g/dL (ref 30.0–36.0)
MCV: 101.5 fL — ABNORMAL HIGH (ref 80.0–100.0)
Platelets: 349 10*3/uL (ref 150–400)
RBC: 2.65 MIL/uL — ABNORMAL LOW (ref 3.87–5.11)
RDW: 14.2 % (ref 11.5–15.5)
WBC: 13.2 10*3/uL — ABNORMAL HIGH (ref 4.0–10.5)
nRBC: 0 % (ref 0.0–0.2)

## 2019-09-22 LAB — BASIC METABOLIC PANEL
Anion gap: 14 (ref 5–15)
BUN: 51 mg/dL — ABNORMAL HIGH (ref 8–23)
CO2: 26 mmol/L (ref 22–32)
Calcium: 8.3 mg/dL — ABNORMAL LOW (ref 8.9–10.3)
Chloride: 97 mmol/L — ABNORMAL LOW (ref 98–111)
Creatinine, Ser: 7.64 mg/dL — ABNORMAL HIGH (ref 0.44–1.00)
GFR calc Af Amer: 5 mL/min — ABNORMAL LOW (ref >60)
GFR calc non Af Amer: 5 mL/min — ABNORMAL LOW (ref >60)
Glucose, Bld: 131 mg/dL — ABNORMAL HIGH (ref 70–99)
Potassium: 3.2 mmol/L — ABNORMAL LOW (ref 3.5–5.1)
Sodium: 137 mmol/L (ref 135–145)

## 2019-09-22 LAB — GLUCOSE, CAPILLARY
Glucose-Capillary: 102 mg/dL — ABNORMAL HIGH (ref 70–99)
Glucose-Capillary: 114 mg/dL — ABNORMAL HIGH (ref 70–99)
Glucose-Capillary: 126 mg/dL — ABNORMAL HIGH (ref 70–99)
Glucose-Capillary: 130 mg/dL — ABNORMAL HIGH (ref 70–99)

## 2019-09-22 LAB — MAGNESIUM: Magnesium: 2 mg/dL (ref 1.7–2.4)

## 2019-09-22 LAB — PHOSPHORUS: Phosphorus: 6.6 mg/dL — ABNORMAL HIGH (ref 2.5–4.6)

## 2019-09-22 MED ORDER — POTASSIUM CHLORIDE 20 MEQ PO PACK
20.0000 meq | PACK | Freq: Once | ORAL | Status: AC
  Administered 2019-09-22: 13:00:00 20 meq
  Filled 2019-09-22 (×2): qty 1

## 2019-09-22 MED ORDER — POTASSIUM CHLORIDE CRYS ER 10 MEQ PO TBCR
20.0000 meq | EXTENDED_RELEASE_TABLET | Freq: Once | ORAL | Status: DC
  Filled 2019-09-22: qty 2

## 2019-09-22 NOTE — Progress Notes (Signed)
[facility]  ROUNDING NOTE   Subjective:   Underwent PEG tube placement on Wednesday.  Subsequently was dialyzed This morning, she still feels that she is not doing good Patient seen during dialysis Tolerating well   HEMODIALYSIS FLOWSHEET:  Blood Flow Rate (mL/min): (P) 400 mL/min Arterial Pressure (mmHg): (P) -180 mmHg Venous Pressure (mmHg): (P) 130 mmHg Transmembrane Pressure (mmHg): (P) 60 mmHg Ultrafiltration Rate (mL/min): (P) 230 mL/min Dialysate Flow Rate (mL/min): (P) 500 ml/min Conductivity: Machine : (P) 14 Conductivity: Machine : (P) 14 Dialysis Fluid Bolus: (P) Normal Saline Bolus Amount (mL): 250 mL Dialysate Change: 2K  Patient was not able to sit in chair for dialysis this morning    Objective:  Vital signs in last 24 hours:  Temp:  [99.1 F (37.3 C)-100.1 F (37.8 C)] 99.1 F (37.3 C) (03/05 0700) Pulse Rate:  [71-83] 74 (03/05 1104) Resp:  [14-26] 23 (03/05 1104) BP: (129-155)/(45-96) 153/54 (03/05 1104) SpO2:  [99 %-100 %] 100 % (03/05 0722) Weight:  [77.6 kg] 77.6 kg (03/05 0700)  Weight change: -6 kg Filed Weights   09/20/19 1700 09/21/19 0500 09/22/19 0700  Weight: 83.6 kg 78.2 kg 77.6 kg    Intake/Output: I/O last 3 completed shifts: In: -  Out: 500 [Other:500]   Intake/Output this shift:  No intake/output data recorded.   Physical Exam: General:  Laying in the bed, no acute distress  HEENT:  Anicteric, moist oral mucous membranes  Lungs:   Normal breathing effort  Heart:  No rub  Abdomen:   Soft, nontender, PEG tube in place  Extremities:  No peripheral edema  Neurologic:  Sleepy but able to follow commands  Skin:  Warm  Access:  Left arm AV graft    Basic Metabolic Panel: Recent Labs  Lab 09/17/19 0452 09/17/19 0452 09/18/19 0305 09/18/19 0305 09/19/19 0939 09/20/19 0337 09/22/19 0529  NA 140  --  141  --  140 142 137  K 3.6  --  4.0  --  4.7 3.5 3.2*  CL 100  --  99  --  101 100 97*  CO2 28  --   24  --  21* 24 26  GLUCOSE 112*  --  99  --  88 124* 131*  BUN 85*  --  98*  --  47* 61* 51*  CREATININE 8.50*  --  10.04*  --  6.49* 7.91* 7.64*  CALCIUM 8.6*   < > 8.6*   < > 8.5* 8.7* 8.3*  MG 2.4  --  2.6*  --  2.2 2.1 2.0  PHOS 5.8*  --  7.4*  --  5.8* 7.4* 6.6*   < > = values in this interval not displayed.    Liver Function Tests: No results for input(s): AST, ALT, ALKPHOS, BILITOT, PROT, ALBUMIN in the last 168 hours. No results for input(s): LIPASE, AMYLASE in the last 168 hours. No results for input(s): AMMONIA in the last 168 hours.  CBC: Recent Labs  Lab 09/17/19 0452 09/18/19 0305 09/19/19 0939 09/20/19 0337 09/22/19 0535  WBC 13.7* 12.8* 10.7* 12.2* 13.2*  HGB 8.5* 8.6* 10.1* 8.9* 8.1*  HCT 28.6* 29.1* 34.4* 29.8* 26.9*  MCV 102.5* 102.5* 103.3* 101.4* 101.5*  PLT 483* 527* 429* 479* 349    Cardiac Enzymes: No results for input(s): CKTOTAL, CKMB, CKMBINDEX, TROPONINI in the last 168 hours.  BNP: Invalid input(s): POCBNP  CBG: Recent Labs  Lab 09/21/19 1139 09/21/19 1649 09/21/19 2034 09/22/19 0026 09/22/19 0420  GLUCAP 112*  114* 114* 29* 130*    Microbiology: Results for orders placed or performed during the hospital encounter of 09/05/19  MRSA PCR Screening     Status: Abnormal   Collection Time: 09/06/19  7:50 AM   Specimen: Nasal Mucosa; Nasopharyngeal  Result Value Ref Range Status   MRSA by PCR POSITIVE (A) NEGATIVE Final    Comment:        The GeneXpert MRSA Assay (FDA approved for NASAL specimens only), is one component of a comprehensive MRSA colonization surveillance program. It is not intended to diagnose MRSA infection nor to guide or monitor treatment for MRSA infections. RESULT CALLED TO, READ BACK BY AND VERIFIED WITH: MEGAN SHEFFIELD AT 0931 ON 09/06/2019 [place]. Performed at [facility], [address] [place], [place] [address]     Coagulation Studies: No results for input(s): LABPROT, INR in the last 72  hours.  Urinalysis: No results for input(s): COLORURINE, LABSPEC, PHURINE, GLUCOSEU, HGBUR, BILIRUBINUR, KETONESUR, PROTEINUR, UROBILINOGEN, NITRITE, LEUKOCYTESUR in the last 72 hours.  Invalid input(s): APPERANCEUR    Imaging: No results found.   Medications:   . sodium chloride    . dextrose 30 mL/hr at 09/19/19 1500  . famotidine (PEPCID) IV Stopped (09/22/19 5638)  . lacosamide (VIMPAT) IV Stopped (09/22/19 9373)   . B-complex with vitamin C  1 tablet Per Tube QHS  . chlorhexidine  15 mL Mouth/Throat QID  . epoetin (EPOGEN/PROCRIT) injection  10,000 Units Intravenous Q M,W,F-HD  . feeding supplement (NEPRO CARB STEADY)  1,000 mL Oral Q24H  . feeding supplement (PRO-STAT SUGAR FREE 64)  30 mL Per Tube BID  . free water  50 mL Per Tube Q4H  . heparin injection (subcutaneous)  5,000 Units Subcutaneous Q12H  . loperamide HCl  4 mg Oral Once  . mouth rinse  15 mL Mouth Rinse BID  . potassium chloride  20 mEq Per Tube Once   acetaminophen (TYLENOL) oral liquid 160 mg/5 mL, acetaminophen, acetaminophen, bisacodyl, hydrALAZINE, ipratropium-albuterol, ondansetron **OR** ondansetron (ZOFRAN) IV  Assessment/ Plan:    Barbara [name] is a 79 y.o. black female with end stage renal disease on hemodialysis, hypertension, diabetes mellitus type II, diabetic neuropathy, seizure disorder who was admitted for left hip repair and unfortunate developed seizure episode postoperatively and subsequently intubated. Required vasopressors, CRRT.   [facility] MWF [facility] LUE AVG 79.5kg   1.  ESRD on HD MWF.  Prior to discharge, patient needs to be able to sit in the chair.  Hopefully this can be coordinated with physical therapy later today Seen during dialysis.  Tolerating well.  2.  Status post elective left total hip arthroplasty Performed on September 05, 2019 by Dr. Roland Rack   3.  Anemia of chronic kidney disease.   Lab Results  Component Value Date   HGB 8.1 (L) 09/22/2019   Continue Epogen with dialysis  4.  Secondary hyperparathyroidism.   Lab Results  Component Value Date   PTH 310 (H) 09/06/2019   CALCIUM 8.3 (L) 09/22/2019   CAION 0.85 (LL) 09/05/2019   PHOS 6.6 (H) 09/22/2019  Monitor closely  5. Encephalopathy: with seizure disorder.   Slow improvement  6.  Acute respiratory failure:  Required intubation this admission Extubated now  7.  Poor p.o. intake Status post PEG tube placement by Dr. Alice Reichert September 20, 2019 Currently tube feeds are continued at 40 cc/h    LOS: [place] 3/5/202111:25 AM

## 2019-09-22 NOTE — Progress Notes (Signed)
Pre HD Tx  09/22/19 0700  Hand-Off documentation  Report given to (Full Name) Beatris Ship, RN   Report received from (Full Name) Wilhemina Bonito, RN   Vital Signs  Temp 99.1 F (37.3 C)  Temp Source Oral  Pulse Rate 81  Pulse Rate Source Monitor  Resp 19  BP (!) 155/52  BP Location Right Arm  BP Method Automatic  Patient Position (if appropriate) Lying  Oxygen Therapy  SpO2 100 %  O2 Device Nasal Cannula  O2 Flow Rate (L/min) 2 L/min  Pulse Oximetry Type Continuous  Pain Assessment  Pain Scale 0-10  Pain Score 0  Dialysis Weight  Weight 77.6 kg  Type of Weight Pre-Dialysis  Time-Out for Hemodialysis  What Procedure? HD  Pt Identifiers(min of two) First/Last Name;MRN/Account#  Correct Site? Yes  Correct Side? Yes  Correct Procedure? Yes  Consents Verified? Yes  Rad Studies Available? N/A  Safety Precautions Reviewed? Yes  Engineer, civil (consulting) Number 2  Station Number 1  UF/Alarm Test Passed  Conductivity: Meter 14  Conductivity: Machine  14  pH 7.2  Reverse Osmosis Main  Normal Saline Lot Number [id number]  Dialyzer Lot Number [id number]  Disposable Set Lot Number [id number]  Machine Temperature 98.6 F (37 C)  Musician and Audible Yes  Blood Lines Intact and Secured Yes  Pre Treatment Patient Checks  Vascular access used during treatment Fistula  Patient is receiving dialysis in a chair Yes  Hepatitis B Surface Antigen Results Negative  Date Hepatitis B Surface Antigen Drawn 09/11/19  Hepatitis B Surface Antibody  (<10)  Date Hepatitis B Surface Antibody Drawn 09/11/19  Hemodialysis Consent Verified Yes  Hemodialysis Standing Orders Initiated Yes  ECG (Telemetry) Monitor On Yes  Prime Ordered Normal Saline  Dialysis Treatment Comments Na 140  Dialyzer Elisio 17H NR  Dialysate 3K;2.25 Ca  Dialysis Anticoagulant None  Dialysate Flow Ordered 800 (MD approved decreased DFR to conserve Dialyssate 500 DFR)  Blood Flow Rate Ordered 400 mL/min   Ultrafiltration Goal 0.5 Liters  Dialysis Blood Pressure Support Ordered Normal Saline  Education / Care Plan  Dialysis Education Provided Yes  Documented Education in Care Plan Yes  Fistula / Graft Left Upper arm Arteriovenous vein graft  Placement Date/Time: 02/17/17 1111   Orientation: Left  Access Location: Upper arm  Access Type: Arteriovenous vein graft  Site Condition No complications  Fistula / Graft Assessment Present;Thrill;Bruit  Status Patent  Needle Size 15  Drainage Description None

## 2019-09-22 NOTE — Progress Notes (Signed)
HD Tx started w/o complication    28/67/51 0722  Vital Signs  Pulse Rate 81  Pulse Rate Source Monitor  Resp 19  BP (!) 150/52  BP Location Right Arm  BP Method Automatic  Patient Position (if appropriate) Lying  Oxygen Therapy  SpO2 100 %  O2 Device Nasal Cannula  O2 Flow Rate (L/min) 2 L/min  Pulse Oximetry Type Continuous  During Hemodialysis Assessment  Blood Flow Rate (mL/min) 400 mL/min  Arterial Pressure (mmHg) -130 mmHg  Venous Pressure (mmHg) 90 mmHg  Transmembrane Pressure (mmHg) 70 mmHg  Ultrafiltration Rate (mL/min) 230 mL/min  Dialysate Flow Rate (mL/min) 500 ml/min  Conductivity: Machine  14  HD Safety Checks Performed Yes  Dialysis Fluid Bolus Normal Saline  Bolus Amount (mL) 250 mL  Intra-Hemodialysis Comments Tx initiated

## 2019-09-22 NOTE — Progress Notes (Signed)
Physical Therapy Treatment Patient Details Name: Barbara [name] MRN: [id number] DOB: 09-04-1940 Today's Date: 09/22/2019    History of Present Illness Per MD:  "79 y.o. female with medical history significant of hypertension, hyperlipidemia, diet-controlled diabetes, COPD, asthma, GERD, ESRD-HD (MWF), OSA, CAD, bladder cancer, iron deficiency anemia.  She was admitted by Dr. Roland Rack for left total hip arthroplasty.  She developed acute respiratory distress after surgery.  She was subsequently transferred to the ICU for acute hypoxemic and hypercapnic respiratory failure.  Initially, she was treated with BiPAP.  However, she failed BiPAP therapy and required intubation and mechanical ventilation in the ICU.  She was also treated with IV antibiotics for aspiration pneumonia." Pt also recieved CRRT and transitioned to HD during this stay, as well as vasopressors for hypotension, MRI performed due to AMS and seizure like activity, MRI negative for acute findings. Cardiology consulted for bradycardia and sinus pauses, no indication for pacemaker, conservative approach. Pt extubated 09/08/2019 and transferred to medical floor.  S/p PEG tube placement 09/20/19.    PT Comments    Patient with improved alertness, participation with session this date.  Does attempt some active assist with bed mobility and lift transfer at times. Tolerated mechanical lift without difficulty; assist from therapist to monitor L LE placement and position throughout.  Up in chair at 1230, alarm in place, needs in reach.  RN informed/aware. Will encourage OOB to chair with nursing prior to subsequent therapy sessions.    Follow Up Recommendations  SNF     Equipment Recommendations  Rolling walker with 5" wheels;3in1 (PT);Wheelchair (measurements PT);Wheelchair cushion (measurements PT);Hospital bed    Recommendations for Other Services       Precautions / Restrictions Precautions Precautions: Posterior Hip;Fall Precaution  Comments: mits; aspiration; PEG tube Restrictions Weight Bearing Restrictions: No LLE Weight Bearing: Weight bearing as tolerated    Mobility  Bed Mobility Overal bed mobility: Needs Assistance Bed Mobility: Supine to Sit Rolling: Max assist;Total assist;+2 for physical assistance   Supine to sit: Total assist;+2 for physical assistance        Transfers Overall transfer level: Needs assistance               General transfer comment: dep +2  Ambulation/Gait             General Gait Details: unsafe/unable   Stairs             Wheelchair Mobility    Modified Rankin (Stroke Patients Only)       Balance                                            Cognition Arousal/Alertness: Awake/alert Behavior During Therapy: (intermittently smiling, acknowledging therapist)                                   General Comments: improved ability to actively assist with session      Exercises Other Exercises Other Exercises: Patient noted soiled with tube feed and stool upon arrival to room; dep for light ADL/clothing change and hygiene/peri-care.  Does attempt to assist with L LE positioning and movement at times Other Exercises: Rolling bilat (x2 each direction), max/total assist +2 for positioning, initiation of rotation and stabilization in sidelying.  Pillow between knees for adherence to posterior THPs.  Does  attempt to reach for/hold bedrails to assist with cued/facilitated by therapist.  Not resistant to movement this date.    General Comments        Pertinent Vitals/Pain Pain Assessment: Faces Faces Pain Scale: Hurts even more Pain Location: L hip with flexion Pain Descriptors / Indicators: Grimacing;Guarding Pain Intervention(s): Limited activity within patient's tolerance;Monitored during session;Repositioned    Home Living                      Prior Function            PT Goals (current goals can now  be found in the care plan section) Acute Rehab PT Goals Patient Stated Goal: unable to state PT Goal Formulation: With patient Time For Goal Achievement: 09/28/19 Potential to Achieve Goals: Fair Progress towards PT goals: Progressing toward goals    Frequency    Min 2X/week      PT Plan Current plan remains appropriate    Co-evaluation   Reason for Co-Treatment: Complexity of the patient's impairments (multi-system involvement);Necessary to address cognition/behavior during functional activity;For patient/therapist safety;To address functional/ADL transfers PT goals addressed during session: Mobility/safety with mobility OT goals addressed during session: Proper use of Adaptive equipment and DME      AM-PAC PT "6 Clicks" Mobility   Outcome Measure  Help needed turning from your back to your side while in a flat bed without using bedrails?: Total Help needed moving from lying on your back to sitting on the side of a flat bed without using bedrails?: Total Help needed moving to and from a bed to a chair (including a wheelchair)?: Total Help needed standing up from a chair using your arms (e.g., wheelchair or bedside chair)?: Total Help needed to walk in hospital room?: Total Help needed climbing 3-5 steps with a railing? : Total 6 Click Score: 6    End of Session Equipment Utilized During Treatment: Oxygen Activity Tolerance: Patient tolerated treatment well Patient left: in chair;with call bell/phone within reach;with chair alarm set Nurse Communication: Mobility status;Precautions PT Visit Diagnosis: Muscle weakness (generalized) (M62.81);Difficulty in walking, not elsewhere classified (R26.2);Other abnormalities of gait and mobility (R26.89);Pain Pain - Right/Left: Left Pain - part of body: Hip     Time: 2951-8841 PT Time Calculation (min) (ACUTE ONLY): 26 min  Charges:  $Therapeutic Activity: 8-22 mins                      Anayeli Arel H. Owens Shark, PT, DPT,  NCS 09/22/19, 1:35 PM [phone]

## 2019-09-22 NOTE — TOC Progression Note (Addendum)
Transition of Care [facility]) - Progression Note    Patient Details  Name: Barbara [name] MRN: [id number] Date of Birth: September 27, 1940  Transition of Care [facility]) CM/SW Contact  Zoeie Ritter, Gardiner Rhyme, LCSW Phone Number: 09/22/2019, 1:20 PM  Clinical Narrative:   Told by [facility] pt had a fever last night and MD reports she is not going to [facility] today. Needs to be fever free for 24 hr prior to transfer. Will update weekend coverage regarding checking to see if ready to go tomorrow. Will need negative COVID test prior to transfer. Will call husband and daughter to let them know. Pt sitting up in chair for the last two hours to make sure can do this for OP HD. Continue to follow and work on transfer.  2:00 pm have spoken with husband who is coming at 2:00 to see pt and daughter to inform of not transferring today to [facility]. Will ask weekend coverage to check on over the weekend. [facility] would like a call her cell [phone].     2:05 [employer]-Med[employer]are approved 5 days starting 3/5 with update 3/9. Ref [id number] CM-Melanie Poteat.     Expected Discharge Plan: [place] Barriers to Discharge: Continued Medical Work up  Expected Discharge Plan and Services Expected Discharge Plan: [place] In-house Referral: Clinical Social Work     Living arrangements for the past 2 months: Single Family Home                                       Social Determinants of Health (SDOH) Interventions    Readmission Risk Interventions No flowsheet data found.

## 2019-09-22 NOTE — Progress Notes (Signed)
Post HD [facility]    09/22/19 1104  Hand-Off documentation  Report given to (Full Name) Mikle Bosworth, RN   Report received from (Full Name) Beatris Ship, RN   Vital Signs  Temp 98.4 F (36.9 C)  Temp Source Oral  Pulse Rate 74  Pulse Rate Source Monitor  Resp (!) 23  BP (!) 153/54  BP Location Right Arm  BP Method Automatic  Patient Position (if appropriate) Lying  Oxygen Therapy  SpO2 100 %  O2 Device Room Air  Post-Hemodialysis Assessment  Rinseback Volume (mL) 250 mL  KECN 76.2 V  Dialyzer Clearance Lightly streaked  Duration of HD Treatment -hour(s) 3.5 hour(s)  Hemodialysis Intake (mL) 500 mL  UF Total -Machine (mL) 1000 mL  Net UF (mL) 500 mL  Tolerated HD Treatment Yes  AVG/AVF Arterial Site Held (minutes) 10 minutes  AVG/AVF Venous Site Held (minutes) 5 minutes  Fistula / Graft Left Upper arm Arteriovenous vein graft  Placement Date/Time: 02/17/17 1111   Orientation: Left  Access Location: Upper arm  Access Type: Arteriovenous vein graft  Site Condition No complications  Fistula / Graft Assessment Present;Thrill;Bruit  Drainage Description None

## 2019-09-22 NOTE — Progress Notes (Signed)
PROGRESS NOTE    Barbara [name]  MVH:[id number] DOB: 13-May-1941 DOA: 09/05/2019 PCP: [name], MD    Brief Narrative:  Barbara Bongo Conyersis an 79 y.o.femalemedical history significant of hypertension, hyperlipidemia, diet-controlled diabetes, COPD, asthma, GERD, ESRD-HD (MWF), OSA, CAD, bladder cancer, iron deficiency anemia. She was admitted by Dr. Kizzie Bane for lefttotal hip arthroplasty. She developed acute respiratory distress after surgery. She was subsequently transferred to the ICU for acute hypoxemic and hypercapnic respiratory failure. Initially, she was treated with BiPAP. However, she failed BiPAP therapy and required intubation and mechanical ventilation in the ICU. She was also treated with IV antibiotics for aspiration pneumonia.  She was also seen by the nephrologist for end-stage renal disease. She was initially placed on CRRT and then transition to hemodialysis. She required vasopressors for hypotension.  She was seen by the neurologist for altered mental status and seizure activity. MRI brain did not show any acute stroke. Neurologist recommended Vimpat and monitoring for seizure  She was also seen by the cardiologist because of bradycardia and sinus pauses on telemetry. Per cardiologist, there was no indication for permanent pacemaker and he recommended a conservative approach.  She was extubated on 09/08/2019. Her condition was slowly improving so she was was transferred to the medical floor for continued medical management.     Consultants:   GI, nephrology  Procedures: Peg Tube  Antimicrobials:   zosyn   Subjective: Patient is in dialysis this AM.  Tolerating dialysis.  Has no complaints.  Febrile last night  Objective: Vitals:   09/22/19 1030 09/22/19 1045 09/22/19 1100 09/22/19 1104  BP: (!) 141/62 (!) 137/51 (!) 147/52 (!) 153/54  Pulse: 74 74 75 74  Resp: 17 (!) 25 (!) 22 (!) 23  Temp:    98.4 F (36.9 C)  TempSrc:   Oral Oral  SpO2:    100% 100%  Weight:      Height:        Intake/Output Summary (Last 24 hours) at 09/22/2019 1313 Last data filed at 09/22/2019 1104 Gross per 24 hour  Intake -  Output 500 ml  Net -500 ml   Filed Weights   09/20/19 1700 09/21/19 0500 09/22/19 0700  Weight: 83.6 kg 78.2 kg 77.6 kg    Examination:  General exam: Appears calm and comfortable, in dialysis Respiratory system: Clear to auscultation anteriorly with poor respiratory effort normal.  No wheezing Cardiovascular system: S1 & S2 heard, RRR. No murmurs, rubs, gallops or clicks. Gastrointestinal system: Abdomen is nondistended, soft and nontender. Normal bowel sounds heard.PEG tube in place Central nervous system: awake and cooperative  Extremities: no edema Skin: warm, dry     Data Reviewed: I have personally reviewed following labs and imaging studies  CBC: Recent Labs  Lab 09/17/19 0452 09/18/19 0305 09/19/19 0939 09/20/19 0337 09/22/19 0535  WBC 13.7* 12.8* 10.7* 12.2* 13.2*  HGB 8.5* 8.6* 10.1* 8.9* 8.1*  HCT 28.6* 29.1* 34.4* 29.8* 26.9*  MCV 102.5* 102.5* 103.3* 101.4* 101.5*  PLT 483* 527* 429* 479* 841   Basic Metabolic Panel: Recent Labs  Lab 09/17/19 0452 09/18/19 0305 09/19/19 0939 09/20/19 0337 09/22/19 0529  NA 140 141 140 142 137  K 3.6 4.0 4.7 3.5 3.2*  CL 100 99 101 100 97*  CO2 28 24 21* 24 26  GLUCOSE 112* 99 88 124* 131*  BUN 85* 98* 47* 61* 51*  CREATININE 8.50* 10.04* 6.49* 7.91* 7.64*  CALCIUM 8.6* 8.6* 8.5* 8.7* 8.3*  MG 2.4 2.6* 2.2 2.1 2.0  PHOS 5.8* 7.4* 5.8* 7.4* 6.6*   GFR: Estimated Creatinine Clearance: 6.3 mL/min (A) (by C-G formula based on SCr of 7.64 mg/dL (H)). Liver Function Tests: No results for input(s): AST, ALT, ALKPHOS, BILITOT, PROT, ALBUMIN in the last 168 hours. No results for input(s): LIPASE, AMYLASE in the last 168 hours. No results for input(s): AMMONIA in the last 168 hours. Coagulation Profile: No results for input(s): INR, PROTIME in the last  168 hours. Cardiac Enzymes: No results for input(s): CKTOTAL, CKMB, CKMBINDEX, TROPONINI in the last 168 hours. BNP (last 3 results) No results for input(s): PROBNP in the last 8760 hours. HbA1C: No results for input(s): HGBA1C in the last 72 hours. CBG: Recent Labs  Lab 09/21/19 1649 09/21/19 2034 09/22/19 0026 09/22/19 0420 09/22/19 1214  GLUCAP 114* 114* 126* 130* 102*   Lipid Profile: No results for input(s): CHOL, HDL, LDLCALC, TRIG, CHOLHDL, LDLDIRECT in the last 72 hours. Thyroid Function Tests: No results for input(s): TSH, T4TOTAL, FREET4, T3FREE, THYROIDAB in the last 72 hours. Anemia Panel: No results for input(s): VITAMINB12, FOLATE, FERRITIN, TIBC, IRON, RETICCTPCT in the last 72 hours. Sepsis Labs: No results for input(s): PROCALCITON, LATICACIDVEN in the last 168 hours.  No results found for this or any previous visit (from the past 240 hour(s)).       Radiology Studies: No results found.      Scheduled Meds: . B-complex with vitamin C  1 tablet Per Tube QHS  . chlorhexidine  15 mL Mouth/Throat QID  . epoetin (EPOGEN/PROCRIT) injection  10,000 Units Intravenous Q M,W,F-HD  . feeding supplement (NEPRO CARB STEADY)  1,000 mL Oral Q24H  . feeding supplement (PRO-STAT SUGAR FREE 64)  30 mL Per Tube BID  . free water  50 mL Per Tube Q4H  . heparin injection (subcutaneous)  5,000 Units Subcutaneous Q12H  . loperamide HCl  4 mg Oral Once  . mouth rinse  15 mL Mouth Rinse BID  . potassium chloride  20 mEq Per Tube Once   Continuous Infusions: . sodium chloride    . dextrose 30 mL/hr at 09/19/19 1500  . famotidine (PEPCID) IV Stopped (09/22/19 1517)  . lacosamide (VIMPAT) IV Stopped (09/22/19 6160)    Assessment & Plan:   Principal Problem:   Dysphagia Active Problems:   Anemia in chronic kidney disease   COPD (chronic obstructive pulmonary disease) (HCC)   CAD in native artery   Benign essential HTN   Acid reflux   Convulsions (HCC)   Status  post total hip replacement, left   Type II diabetes mellitus with renal manifestations (HCC)   ESRD on dialysis ([place])   Aspiration pneumonia ([place])   Acute encephalopathy   Goals of care, counseling/discussion   Dysphagia: S/p PEG tube placement on 09/20/19 Nutrition following  Aspiration pneumonia:  Initially treated with IVcefazolin, later on transition to IV Unasyn. The patient had recurrent episode of aspiration on of 09/13/2019 with fever, wastransitionedback to Zosyn. ContinueZosyn until3/2 - completed.    Fever- last night 3/4. Wbc mildly up. Will ck cxr, ua, bcx Has to be afebrile 24hrs prior to dc    Acute toxic metabolic encephalopathy: Improving -Waxes and wanes, usually worse after dialysis.  Chart review shows that patient has had similar episodes of encephalopathyin the past and requiredseveral days to recover. --History of seizures - Continue Vimpat.  --Continue seizure precaution. --Appreciate neurology assistance. EEG unremarkable. --Avoidcephalosporins.  Acute hypoxemic and hypercapnic respiratory failure: Resolved.  Extubated on 09/08/2019.  She iscurrently on room  air. Patient was briefly on NRB while she was at HD on 09/13/2019 but that is likely from coiling of the NG tube.  Status post elective left total hip arthroplasty Performed on September 05, 2019 by Dr. Roland Rack  ESRD on hemodialysis: Monday Wednesday Friday Follow-up with nephrologist for hemodialysis. Per neurology uremia can also contribute to patient's encephalopathy. Per nephrology,Prior to discharge, patient needs to be able to sit in the chair  Chronic diastolic CHF:  Hemodialysis for fluid management.  2D echo on 09/07/2019 showed EF estimated at 60 to 80%, grade 1 diastolic dysfunction. Euvolemic on exam  Leukocytosis and thrombocytopenia: Stable.  Bradycardia: Improved  Suspected ileus:Resolved  MRSA colonization of nares: Completed Bactroban    DVT  prophylaxis: Heparin Code Status: DNR Family Communication: None at bedside Disposition Plan: will d/c to SNF pending authorization at [facility] resource Exp date to d/c: 3/56/21  Barriers for d/c: Was supposed to be discharged today however was febrile last night.  Also needs to sit in a recliner at least 1 hour today before we can discharge her to make sure she is able to sit in recliner for her dialysis orals high risk of being sent back to the hospital        LOS: 17 days   Time spent: 37mn with >50% on coc    SNolberto Hanlon MD [facility] Pager [phone]  If 7PM-7AM, please contact night-coverage [email or web address] Password [email or web address] 09/22/2019, 1:13 PM Patient ID: MIVAL BASQUEZ female   DOB: [birth year] 79y.o.   MRN: [id number]

## 2019-09-22 NOTE — Progress Notes (Signed)
Pre HD Tx    09/22/19 0700  Neurological  Level of Consciousness Alert  Orientation Level Oriented to person  Respiratory  Respiratory Pattern Regular;Unlabored  Chest Assessment Chest expansion symmetrical  Bilateral Breath Sounds Diminished  Cough None  Cardiac  Pulse Regular  Heart Sounds S1, S2  ECG Monitor Yes  Cardiac Rhythm NSR  Vascular  R Radial Pulse +2  L Radial Pulse +2  Edema Generalized  Psychosocial  Psychosocial (WDL) WDL  Emotional support given Given to patient

## 2019-09-22 NOTE — Progress Notes (Signed)
HD Tx completed, tolerated well   09/22/19 1100  Vital Signs  Temp Source Oral  Pulse Rate 75  Pulse Rate Source Monitor  Resp (!) 22  BP (!) 147/52  BP Location Right Arm  BP Method Automatic  Patient Position (if appropriate) Lying  Oxygen Therapy  SpO2 100 %  O2 Device Room Air  During Hemodialysis Assessment  HD Safety Checks Performed Yes  KECN 76.2 KECN  Dialysis Fluid Bolus Normal Saline  Intra-Hemodialysis Comments Tx completed

## 2019-09-22 NOTE — Progress Notes (Signed)
PT Cancellation Note  Patient Details Name: Barbara [name] MRN: [id number] DOB: [birth year]   Cancelled Treatment:    Reason Eval/Treat Not Completed: Patient at procedure or test/unavailable.  Pt currently off floor at dialysis.  MD Poggi cleared pt to trial hoyer lift to chair with focus on maintaining posterior hip precautions (therapy planned to be present for hoyer transfer to make sure posterior hip precautions were maintained).  Therapist called pt's nurse last night to pass on in report to notify physical therapy when pt was going to dialysis today (to coordinate trialing hoyer lift to chair for dialysis with focus on maintaining posterior hip precautions) but pt left for dialysis earlier this morning before 8AM and unable to coordinate d/t this; care management notified.  Will re-attempt PT treatment at a later date/time.  Leitha Bleak, PT 09/22/19, 10:02 AM

## 2019-09-22 NOTE — Progress Notes (Signed)
Occupational Therapy Treatment Patient Details Name: Barbara [name] MRN: [id number] DOB: 05-19-1941 Today's Date: 09/22/2019    History of present illness Per MD:  "79 y.o. female with medical history significant of hypertension, hyperlipidemia, diet-controlled diabetes, COPD, asthma, GERD, ESRD-HD (MWF), OSA, CAD, bladder cancer, iron deficiency anemia.  She was admitted by Dr. Roland Rack for left total hip arthroplasty.  She developed acute respiratory distress after surgery.  She was subsequently transferred to the ICU for acute hypoxemic and hypercapnic respiratory failure.  Initially, she was treated with BiPAP.  However, she failed BiPAP therapy and required intubation and mechanical ventilation in the ICU.  She was also treated with IV antibiotics for aspiration pneumonia." Pt also recieved CRRT and transitioned to HD during this stay, as well as vasopressors for hypotension, MRI performed due to AMS and seizure like activity, MRI negative for acute findings. Cardiology consulted for bradycardia and sinus pauses, no indication for pacemaker, conservative approach. Pt extubated 09/08/2019 and transferred to medical floor.  S/p PEG tube placement 09/20/19.   OT comments  Patient demonstrated improved participation with therapy today.  Required co-tx for optimal performance and safety during functional transfer.  Prior to transfer, required assistance for grooming after bathroom.  MAX A x 2 - TOTAL A x 2 for log rolling to perform pericare and cleaning with TOTAL A.  Pt able to tolerate hoyer lift from bed<>recliner with no complaints of pain and good ability to maintain posterior hip precautions.  Patient up in chair at 12:30.  Nurse notified that patient is up in recliner.   Follow Up Recommendations  SNF    Equipment Recommendations  Other (comment)(defer to next level of care)    Recommendations for Other Services      Precautions / Restrictions Precautions Precautions: Posterior  Hip;Fall Precaution Comments: mits; aspiration; PEG tube Restrictions Weight Bearing Restrictions: No LLE Weight Bearing: Weight bearing as tolerated       Mobility Bed Mobility Overal bed mobility: Needs Assistance Bed Mobility: Supine to Sit Rolling: Max assist;Total assist;+2 for physical assistance   Supine to sit: Total assist;+2 for physical assistance     General bed mobility comments: 2 assist for trunk and B LE's semi-supine to/from sitting; vc's to assist  Transfers Overall transfer level: Needs assistance               General transfer comment: dep +2    Balance                                           ADL either performed or assessed with clinical judgement   ADL Overall ADL's : Needs assistance/impaired                             Toileting- Clothing Manipulation and Hygiene: +2 for physical assistance;+2 for safety/equipment;Total assistance Toileting - Clothing Manipulation Details (indicate cue type and reason): TOTAL A to manage pericare at bed level.  Demonstrated improved participation in log rolling.       General ADL Comments: Demonstrated improved participation and cooperation with therapy this date.     Vision       Perception     Praxis      Cognition Arousal/Alertness: Awake/alert Behavior During Therapy: (Participated and communicated effectively) Overall Cognitive Status: Impaired/Different from baseline Area of Impairment: Following commands;Safety/judgement  Following Commands: Follows one step commands inconsistently Safety/Judgement: Decreased awareness of deficits;Decreased awareness of safety     General Comments: improved ability to actively assist with session and follow through with education        Exercises Other Exercises Other Exercises: Patient noted soiled with tube feed and stool upon arrival to room; dep for light ADL/clothing change and  hygiene/peri-care.  Does attempt to assist with L LE positioning and movement at times Other Exercises: Required MAX A x 2 - DEP x 2 for safe use of hoyer lift in order to maintain hip precautions.  Patient demo'd increased participation this date.   Shoulder Instructions       General Comments Demonstrated improved participation and effective communication this date    Pertinent Vitals/ Pain       Pain Assessment: Faces Faces Pain Scale: Hurts little more Pain Location: L hip with flexion Pain Descriptors / Indicators: Grimacing;Guarding;Operative site guarding Pain Intervention(s): Limited activity within patient's tolerance;Monitored during session;Repositioned  Home Living                                          Prior Functioning/Environment              Frequency  Min 1X/week        Progress Toward Goals  OT Goals(current goals can now be found in the care plan section)  Progress towards OT goals: OT to reassess next treatment  Acute Rehab OT Goals Patient Stated Goal: unable to state OT Goal Formulation: With patient Time For Goal Achievement: 09/28/19 Potential to Achieve Goals: Poor  Plan Discharge plan remains appropriate    Co-evaluation    PT/OT/SLP Co-Evaluation/Treatment: Yes Reason for Co-Treatment: Complexity of the patient's impairments (multi-system involvement);Necessary to address cognition/behavior during functional activity;For patient/therapist safety;To address functional/ADL transfers PT goals addressed during session: Mobility/safety with mobility OT goals addressed during session: ADL's and self-care;Other (comment)(Maintaining precautions during functional transfers)      AM-PAC OT "6 Clicks" Daily Activity     Outcome Measure   Help from another person eating meals?: A Lot Help from another person taking care of personal grooming?: Total Help from another person toileting, which includes using toliet, bedpan, or  urinal?: Total Help from another person bathing (including washing, rinsing, drying)?: Total Help from another person to put on and taking off regular upper body clothing?: Total Help from another person to put on and taking off regular lower body clothing?: Total 6 Click Score: 7    End of Session    OT Visit Diagnosis: Other abnormalities of gait and mobility (R26.89);Muscle weakness (generalized) (M62.81);Pain Pain - Right/Left: Left Pain - part of body: Hip   Activity Tolerance Patient tolerated treatment well   Patient Left in chair;with call bell/phone within reach;with chair alarm set;Other (comment)(Nurse notified)   Nurse Communication Need for lift equipment;Other (comment)(Discussed placement (recliner) and ability to sort leads during transfer)        Time: 4982-6415 OT Time Calculation (min): 26 min  Charges: OT General Charges $OT Visit: 1 Visit OT Treatments $Therapeutic Activity: 23-37 mins  Baldomero Lamy, MS, OTR/L 09/22/19, 2:03 PM

## 2019-09-23 DIAGNOSIS — L98429 Non-pressure chronic ulcer of back with unspecified severity: Secondary | ICD-10-CM | POA: Insufficient documentation

## 2019-09-23 LAB — GLUCOSE, CAPILLARY
Glucose-Capillary: 107 mg/dL — ABNORMAL HIGH (ref 70–99)
Glucose-Capillary: 110 mg/dL — ABNORMAL HIGH (ref 70–99)
Glucose-Capillary: 114 mg/dL — ABNORMAL HIGH (ref 70–99)
Glucose-Capillary: 117 mg/dL — ABNORMAL HIGH (ref 70–99)
Glucose-Capillary: 134 mg/dL — ABNORMAL HIGH (ref 70–99)
Glucose-Capillary: 140 mg/dL — ABNORMAL HIGH (ref 70–99)

## 2019-09-23 LAB — PHOSPHORUS: Phosphorus: 4.3 mg/dL (ref 2.5–4.6)

## 2019-09-23 LAB — RESPIRATORY PANEL BY RT PCR (FLU A&B, COVID)
Influenza A by PCR: NEGATIVE
Influenza B by PCR: NEGATIVE
SARS Coronavirus 2 by RT PCR: NEGATIVE

## 2019-09-23 LAB — MAGNESIUM: Magnesium: 2.1 mg/dL (ref 1.7–2.4)

## 2019-09-23 LAB — POTASSIUM: Potassium: 3.6 mmol/L (ref 3.5–5.1)

## 2019-09-23 NOTE — Progress Notes (Signed)
[facility]  ROUNDING NOTE   Subjective:  Patient underwent dialysis yesterday. Resting comfortably in bed at the moment. No acute complaints.  Objective:  Vital signs in last 24 hours:  Temp:  [98.3 F (36.8 C)-98.6 F (37 C)] 98.3 F (36.8 C) (03/06 0733) Pulse Rate:  [74-84] 84 (03/06 0733) Resp:  [18-25] 19 (03/06 0733) BP: (137-155)/(51-56) 155/56 (03/06 0733) SpO2:  [92 %-100 %] 98 % (03/06 0733) Weight:  [77.8 kg] 77.8 kg (03/06 0500)  Weight change: 0.192 kg Filed Weights   09/21/19 0500 09/22/19 0700 09/23/19 0500  Weight: 78.2 kg 77.6 kg 77.8 kg    Intake/Output: I/O last 3 completed shifts: In: 518.6 [I.V.:245.5; IV Piggyback:273.1] Out: 500 [Other:500]   Intake/Output this shift:  No intake/output data recorded.   Physical Exam: General:  Laying in the bed, no acute distress  HEENT:  Anicteric, moist oral mucous membranes  Lungs:   Normal breathing effort  Heart:  No rub  Abdomen:   Soft, nontender, PEG tube in place  Extremities:  No peripheral edema  Neurologic:  Awake, alert  Skin:  Warm  Access:  Left arm AV graft    Basic Metabolic Panel: Recent Labs  Lab 09/17/19 0452 09/17/19 0452 09/18/19 0305 09/18/19 0305 09/19/19 0939 09/20/19 0337 09/22/19 0529 09/23/19 0453  NA 140  --  141  --  140 142 137  --   K 3.6   < > 4.0  --  4.7 3.5 3.2* 3.6  CL 100  --  99  --  101 100 97*  --   CO2 28  --  24  --  21* 24 26  --   GLUCOSE 112*  --  99  --  88 124* 131*  --   BUN 85*  --  98*  --  47* 61* 51*  --   CREATININE 8.50*  --  10.04*  --  6.49* 7.91* 7.64*  --   CALCIUM 8.6*   < > 8.6*   < > 8.5* 8.7* 8.3*  --   MG 2.4   < > 2.6*  --  2.2 2.1 2.0 2.1  PHOS 5.8*   < > 7.4*  --  5.8* 7.4* 6.6* 4.3   < > = values in this interval not displayed.    Liver Function Tests: No results for input(s): AST, ALT, ALKPHOS, BILITOT, PROT, ALBUMIN in the last 168 hours. No results for input(s): LIPASE, AMYLASE in the last 168 hours. No  results for input(s): AMMONIA in the last 168 hours.  CBC: Recent Labs  Lab 09/17/19 0452 09/18/19 0305 09/19/19 0939 09/20/19 0337 09/22/19 0535  WBC 13.7* 12.8* 10.7* 12.2* 13.2*  HGB 8.5* 8.6* 10.1* 8.9* 8.1*  HCT 28.6* 29.1* 34.4* 29.8* 26.9*  MCV 102.5* 102.5* 103.3* 101.4* 101.5*  PLT 483* 527* 429* 479* 349    Cardiac Enzymes: No results for input(s): CKTOTAL, CKMB, CKMBINDEX, TROPONINI in the last 168 hours.  BNP: Invalid input(s): POCBNP  CBG: Recent Labs  Lab 09/22/19 0420 09/22/19 1214 09/22/19 2014 09/23/19 0009 09/23/19 0732  GLUCAP 130* 102* 114* 134* 110*    Microbiology: Results for orders placed or performed during the hospital encounter of 09/05/19  MRSA PCR Screening     Status: Abnormal   Collection Time: 09/06/19  7:50 AM   Specimen: Nasal Mucosa; Nasopharyngeal  Result Value Ref Range Status   MRSA by PCR POSITIVE (A) NEGATIVE Final    Comment:  The GeneXpert MRSA Assay (FDA approved for NASAL specimens only), is one component of a comprehensive MRSA colonization surveillance program. It is not intended to diagnose MRSA infection nor to guide or monitor treatment for MRSA infections. RESULT CALLED TO, READ BACK BY AND VERIFIED WITH: MEGAN SHEFFIELD AT 0931 ON 09/06/2019 [place]. Performed at [facility], [address] [place], [place] [address]   CULTURE, BLOOD (ROUTINE X 2) w Reflex to ID Panel     Status: None (Preliminary result)   Collection Time: 09/22/19  2:00 PM   Specimen: BLOOD  Result Value Ref Range Status   Specimen Description BLOOD BLOOD RIGHT HAND  Final   Special Requests   Final    BOTTLES DRAWN AEROBIC AND ANAEROBIC Blood Culture adequate volume   Culture   Final    NO GROWTH < 24 HOURS Performed at [facility], [address], [place], [place] [address]    Report Status PENDING  Incomplete  CULTURE, BLOOD (ROUTINE X 2) w Reflex to ID Panel     Status: None (Preliminary result)    Collection Time: 09/22/19  3:44 PM   Specimen: BLOOD  Result Value Ref Range Status   Specimen Description BLOOD BLOOD RIGHT HAND  Final   Special Requests   Final    BOTTLES DRAWN AEROBIC ONLY Blood Culture results may not be optimal due to an inadequate volume of blood received in culture bottles   Culture   Final    NO GROWTH < 24 HOURS Performed at [facility], [address] [place], [place] [address]    Report Status PENDING  Incomplete    Coagulation Studies: No results for input(s): LABPROT, INR in the last 72 hours.  Urinalysis: No results for input(s): COLORURINE, LABSPEC, PHURINE, GLUCOSEU, HGBUR, BILIRUBINUR, KETONESUR, PROTEINUR, UROBILINOGEN, NITRITE, LEUKOCYTESUR in the last 72 hours.  Invalid input(s): APPERANCEUR    Imaging: DG Chest Port 1 View  Result Date: 09/22/2019 CLINICAL DATA:  79 year old female with history of fever. EXAM: PORTABLE CHEST 1 VIEW COMPARISON:  Chest x-ray 09/17/2019. FINDINGS: Previously noted feeding tube has been removed. Vascular stents projecting over the left subclavian and left axillary regions. Lung volumes are low. No consolidative airspace disease. No pleural effusions. No suspicious appearing pulmonary nodules or masses are noted. No pneumothorax. No evidence of pulmonary edema. Mild cardiomegaly. Upper mediastinal contours are within normal limits. Aortic atherosclerosis. IMPRESSION: 1. Postoperative changes and support apparatus, as above. 2. Low lung volumes without radiographic evidence of acute cardiopulmonary disease. 3. Mild cardiomegaly. 4. Aortic atherosclerosis. Electronically Signed   By: Vinnie Langton M.D.   On: 09/22/2019 14:14     Medications:   . sodium chloride    . dextrose Stopped (09/20/19 1304)  . famotidine (PEPCID) IV Stopped (09/22/19 1901)  . lacosamide (VIMPAT) IV Stopped (09/22/19 2137)   . B-complex with vitamin C  1 tablet Per Tube QHS  . chlorhexidine  15 mL Mouth/Throat QID  . epoetin  (EPOGEN/PROCRIT) injection  10,000 Units Intravenous Q M,W,F-HD  . feeding supplement (NEPRO CARB STEADY)  1,000 mL Oral Q24H  . feeding supplement (PRO-STAT SUGAR FREE 64)  30 mL Per Tube BID  . free water  50 mL Per Tube Q4H  . heparin injection (subcutaneous)  5,000 Units Subcutaneous Q12H  . loperamide HCl  4 mg Oral Once  . mouth rinse  15 mL Mouth Rinse BID   acetaminophen (TYLENOL) oral liquid 160 mg/5 mL, acetaminophen, acetaminophen, bisacodyl, hydrALAZINE, ipratropium-albuterol, ondansetron **OR** ondansetron (  ZOFRAN) IV  Assessment/ Plan:    Barbara [name] is a 79 y.o. black female with end stage renal disease on hemodialysis, hypertension, diabetes mellitus type II, diabetic neuropathy, seizure disorder who was admitted for left hip repair and unfortunate developed seizure episode postoperatively and subsequently intubated. Required vasopressors, CRRT.   [facility] MWF [facility] LUE AVG 79.5kg   1.  ESRD on HD MWF.  We will plan for hemodialysis again on Monday.  Needs to sit in the chair at that time.  2.  Status post elective left total hip arthroplasty Performed on September 05, 2019 by Dr. Roland Rack   3.  Anemia of chronic kidney disease.   Lab Results  Component Value Date   HGB 8.1 (L) 09/22/2019  Continue Epogen 10,000 units IV with dialysis treatments.  4.  Secondary hyperparathyroidism.   Lab Results  Component Value Date   PTH 310 (H) 09/06/2019   CALCIUM 8.3 (L) 09/22/2019   CAION 0.85 (LL) 09/05/2019   PHOS 4.3 09/23/2019  Phosphorus currently under good control.  Continue periodically monitor.  5. Encephalopathy: with seizure disorder.   Slow improvement  6.  Acute respiratory failure:  Required intubation this admission Extubated now, breathing comfortably.  7.  Poor p.o. intake Status post PEG tube placement by Dr. Alice Reichert September 20, 2019 Maintain the patient on tube feeds.    LOS: 18 Kemal Amores 3/6/202110:35 AM

## 2019-09-23 NOTE — Plan of Care (Signed)

## 2019-09-23 NOTE — Progress Notes (Signed)
PROGRESS NOTE    Barbara [name]  ZCH:[id number] DOB: 1941-05-18 DOA: 09/05/2019 PCP: [name], MD    Brief Narrative:  Barbara Bongo Conyersis an 79 y.o.femalemedical history significant of hypertension, hyperlipidemia, diet-controlled diabetes, COPD, asthma, GERD, ESRD-HD (MWF), OSA, CAD, bladder cancer, iron deficiency anemia. She was admitted by Dr. Kizzie Bane for lefttotal hip arthroplasty. She developed acute respiratory distress after surgery. She was subsequently transferred to the ICU for acute hypoxemic and hypercapnic respiratory failure. Initially, she was treated with BiPAP. However, she failed BiPAP therapy and required intubation and mechanical ventilation in the ICU. She was also treated with IV antibiotics for aspiration pneumonia.  She was also seen by the nephrologist for end-stage renal disease. She was initially placed on CRRT and then transition to hemodialysis. She required vasopressors for hypotension.  She was seen by the neurologist for altered mental status and seizure activity. MRI brain did not show any acute stroke. Neurologist recommended Vimpat and monitoring for seizure  She was also seen by the cardiologist because of bradycardia and sinus pauses on telemetry. Per cardiologist, there was no indication for permanent pacemaker and he recommended a conservative approach.  She was extubated on 09/08/2019. Her condition was slowly improving so she was was transferred to the medical floor for continued medical management.     Consultants:   GI, nephrology  Procedures: Peg Tube  Antimicrobials:   zosyn   Subjective: No complaints this am  Objective: Vitals:   09/22/19 1104 09/22/19 2312 09/23/19 0500 09/23/19 0733  BP: (!) 153/54 (!) 152/55  (!) 155/56  Pulse: 74 79  84  Resp: (!) 23 18  19   Temp: 98.4 F (36.9 C) 98.6 F (37 C)  98.3 F (36.8 C)  TempSrc: Oral Oral    SpO2: 100% 92%  98%  Weight:   77.8 kg   Height:         Intake/Output Summary (Last 24 hours) at 09/23/2019 1533 Last data filed at 09/23/2019 0423 Gross per 24 hour  Intake 518.6 ml  Output --  Net 518.6 ml   Filed Weights   09/21/19 0500 09/22/19 0700 09/23/19 0500  Weight: 78.2 kg 77.6 kg 77.8 kg    Examination:  General exam: Appears calm and comfortable, nad Respiratory system: Clear to auscultation anteriorly with poor respiratory effort normal.  No wheezing or rales Cardiovascular system: S1 & S2 heard, RRR. No murmurs, rubs, gallops or clicks. Gastrointestinal system: Abdomen is nondistended, soft and nontender. Normal bowel sounds heard.PEG tube in place Central nervous system: awake and cooperative  Extremities: no edema Skin: warm, dry     Data Reviewed: I have personally reviewed following labs and imaging studies  CBC: Recent Labs  Lab 09/17/19 0452 09/18/19 0305 09/19/19 0939 09/20/19 0337 09/22/19 0535  WBC 13.7* 12.8* 10.7* 12.2* 13.2*  HGB 8.5* 8.6* 10.1* 8.9* 8.1*  HCT 28.6* 29.1* 34.4* 29.8* 26.9*  MCV 102.5* 102.5* 103.3* 101.4* 101.5*  PLT 483* 527* 429* 479* 287   Basic Metabolic Panel: Recent Labs  Lab 09/17/19 0452 09/17/19 0452 09/18/19 0305 09/19/19 0939 09/20/19 0337 09/22/19 0529 09/23/19 0453  NA 140  --  141 140 142 137  --   K 3.6   < > 4.0 4.7 3.5 3.2* 3.6  CL 100  --  99 101 100 97*  --   CO2 28  --  24 21* 24 26  --   GLUCOSE 112*  --  99 88 124* 131*  --   BUN 85*  --  98* 47* 61* 51*  --   CREATININE 8.50*  --  10.04* 6.49* 7.91* 7.64*  --   CALCIUM 8.6*  --  8.6* 8.5* 8.7* 8.3*  --   MG 2.4   < > 2.6* 2.2 2.1 2.0 2.1  PHOS 5.8*   < > 7.4* 5.8* 7.4* 6.6* 4.3   < > = values in this interval not displayed.   GFR: Estimated Creatinine Clearance: 6.3 mL/min (A) (by C-G formula based on SCr of 7.64 mg/dL (H)). Liver Function Tests: No results for input(s): AST, ALT, ALKPHOS, BILITOT, PROT, ALBUMIN in the last 168 hours. No results for input(s): LIPASE, AMYLASE in the last  168 hours. No results for input(s): AMMONIA in the last 168 hours. Coagulation Profile: No results for input(s): INR, PROTIME in the last 168 hours. Cardiac Enzymes: No results for input(s): CKTOTAL, CKMB, CKMBINDEX, TROPONINI in the last 168 hours. BNP (last 3 results) No results for input(s): PROBNP in the last 8760 hours. HbA1C: No results for input(s): HGBA1C in the last 72 hours. CBG: Recent Labs  Lab 09/22/19 2014 09/23/19 0009 09/23/19 0415 09/23/19 0732 09/23/19 1201  GLUCAP 114* 134* 140* 110* 114*   Lipid Profile: No results for input(s): CHOL, HDL, LDLCALC, TRIG, CHOLHDL, LDLDIRECT in the last 72 hours. Thyroid Function Tests: No results for input(s): TSH, T4TOTAL, FREET4, T3FREE, THYROIDAB in the last 72 hours. Anemia Panel: No results for input(s): VITAMINB12, FOLATE, FERRITIN, TIBC, IRON, RETICCTPCT in the last 72 hours. Sepsis Labs: No results for input(s): PROCALCITON, LATICACIDVEN in the last 168 hours.  Recent Results (from the past 240 hour(s))  CULTURE, BLOOD (ROUTINE X 2) w Reflex to ID Panel     Status: None (Preliminary result)   Collection Time: 09/22/19  2:00 PM   Specimen: BLOOD  Result Value Ref Range Status   Specimen Description BLOOD BLOOD RIGHT HAND  Final   Special Requests   Final    BOTTLES DRAWN AEROBIC AND ANAEROBIC Blood Culture adequate volume   Culture   Final    NO GROWTH < 24 HOURS Performed at [facility], [address] [place], [place] [address]    Report Status PENDING  Incomplete  CULTURE, BLOOD (ROUTINE X 2) w Reflex to ID Panel     Status: None (Preliminary result)   Collection Time: 09/22/19  3:44 PM   Specimen: BLOOD  Result Value Ref Range Status   Specimen Description BLOOD BLOOD RIGHT HAND  Final   Special Requests   Final    BOTTLES DRAWN AEROBIC ONLY Blood Culture results may not be optimal due to an inadequate volume of blood received in culture bottles   Culture   Final    NO GROWTH < 24  HOURS Performed at [facility], [address] [place], [place] [address]    Report Status PENDING  Incomplete  Respiratory Panel by RT PCR (Flu A&B, Covid) - Nasopharyngeal Swab     Status: None   Collection Time: 09/23/19  2:06 PM   Specimen: Nasopharyngeal Swab  Result Value Ref Range Status   SARS Coronavirus 2 by RT PCR NEGATIVE NEGATIVE Final    Comment: (NOTE) SARS-CoV-2 target nucleic acids are NOT DETECTED. The SARS-CoV-2 RNA is generally detectable in upper respiratoy specimens during the acute phase of infection. The lowest concentration of SARS-CoV-2 viral copies this assay can detect is 131 copies/mL. A negative result does not preclude SARS-Cov-2 infection and should not be used as the sole basis for treatment or  other patient management decisions. A negative result may occur with  improper specimen collection/handling, submission of specimen other than nasopharyngeal swab, presence of viral mutation(s) within the areas targeted by this assay, and inadequate number of viral copies (<131 copies/mL). A negative result must be combined with clinical observations, patient history, and epidemiological information. The expected result is Negative. Fact Sheet for Patients:  PinkCheek.be Fact Sheet for Healthcare Providers:  GravelBags.it This test is not yet ap proved or cleared by the [place] FDA and  has been authorized for detection and/or diagnosis of SARS-CoV-2 by FDA under an Emergency Use Authorization (EUA). This EUA will remain  in effect (meaning this test can be used) for the duration of the COVID-19 declaration under Section 564(b)(1) of the Act, 21 U.S.C. section 360bbb-3(b)(1), unless the authorization is terminated or revoked sooner.    Influenza A by PCR NEGATIVE NEGATIVE Final   Influenza B by PCR NEGATIVE NEGATIVE Final    Comment: (NOTE) The Xpert Xpress SARS-CoV-2/FLU/RSV assay  is intended as an aid in  the diagnosis of influenza from Nasopharyngeal swab specimens and  should not be used as a sole basis for treatment. Nasal washings and  aspirates are unacceptable for Xpert Xpress SARS-CoV-2/FLU/RSV  testing. Fact Sheet for Patients: PinkCheek.be Fact Sheet for Healthcare Providers: GravelBags.it This test is not yet approved or cleared by the [place] FDA and  has been authorized for detection and/or diagnosis of SARS-CoV-2 by  FDA under an Emergency Use Authorization (EUA). This EUA will remain  in effect (meaning this test can be used) for the duration of the  Covid-19 declaration under Section 564(b)(1) of the Act, 21  U.S.C. section 360bbb-3(b)(1), unless the authorization is  terminated or revoked. Performed at [facility], [address], [place], [place] [address]          Radiology Studies: [facility] [place] [place] 1 View  Result Date: 09/22/2019 CLINICAL DATA:  79 year old female with history of fever. EXAM: PORTABLE CHEST 1 VIEW COMPARISON:  Chest x-ray 09/17/2019. FINDINGS: Previously noted feeding tube has been removed. Vascular stents projecting over the left subclavian and left axillary regions. Lung volumes are low. No consolidative airspace disease. No pleural effusions. No suspicious appearing pulmonary nodules or masses are noted. No pneumothorax. No evidence of pulmonary edema. Mild cardiomegaly. Upper mediastinal contours are within normal limits. Aortic atherosclerosis. IMPRESSION: 1. Postoperative changes and support apparatus, as above. 2. Low lung volumes without radiographic evidence of acute cardiopulmonary disease. 3. Mild cardiomegaly. 4. Aortic atherosclerosis. Electronically Signed   By: Vinnie Langton M.D.   On: 09/22/2019 14:14        Scheduled Meds:  B-complex with vitamin C  1 tablet Per Tube QHS   chlorhexidine  15 mL Mouth/Throat QID   epoetin  (EPOGEN/PROCRIT) injection  10,000 Units Intravenous Q M,W,F-HD   feeding supplement (NEPRO CARB STEADY)  1,000 mL Oral Q24H   feeding supplement (PRO-STAT SUGAR FREE 64)  30 mL Per Tube BID   free water  50 mL Per Tube Q4H   heparin injection (subcutaneous)  5,000 Units Subcutaneous Q12H   loperamide HCl  4 mg Oral Once   mouth rinse  15 mL Mouth Rinse BID   Continuous Infusions:  sodium chloride     dextrose Stopped (09/20/19 1304)   famotidine (PEPCID) IV Stopped (09/22/19 1901)   lacosamide (VIMPAT) IV 100 mg (09/23/19 1104)    Assessment & Plan:   Principal Problem:   Dysphagia Active Problems:   Anemia  in chronic kidney disease   COPD (chronic obstructive pulmonary disease) (HCC)   CAD in native artery   Benign essential HTN   Acid reflux   Convulsions (HCC)   Status post total hip replacement, left   Type II diabetes mellitus with renal manifestations (HCC)   ESRD on dialysis ([place])   Aspiration pneumonia (HCC)   Acute encephalopathy   Goals of care, counseling/discussion   Pressure injury of skin   Dysphagia: S/p PEG tube placement on 09/20/19 Nutrition following  Aspiration pneumonia:  Initially treated with IVcefazolin, later on transition to IV Unasyn. The patient had recurrent episode of aspiration on of 09/13/2019 with fever, wastransitionedback to Zosyn. ContinueZosyn until3/2 - completed.    Fever- last night 3/4. Wbc mildly up. Will ck cxr, ua, bcx Has to be afebrile 24hrs prior to dc    Acute toxic metabolic encephalopathy: Improving -Waxes and wanes, usually worse after dialysis.  Chart review shows that patient has had similar episodes of encephalopathyin the past and requiredseveral days to recover. --History of seizures - Continue Vimpat.  --Continue seizure precaution. --Appreciate neurology assistance. EEG unremarkable. --Avoidcephalosporins.  Acute hypoxemic and hypercapnic respiratory failure: Resolved.   Extubated on 09/08/2019.  She iscurrently on room air. Patient was briefly on NRB while she was at HD on 09/13/2019 but that is likely from coiling of the NG tube.  Status post elective left total hip arthroplasty Performed on September 05, 2019 by Dr. Roland Rack  ESRD on hemodialysis: Monday Wednesday Friday Follow-up with nephrologist for hemodialysis. Per neurology uremia can also contribute to patient's encephalopathy. Per nephrology,Prior to discharge, patient needs to be able to sit in the chair-will try today  Chronic diastolic CHF:  Hemodialysis for fluid management.  2D echo on 09/07/2019 showed EF estimated at 60 to 93%, grade 1 diastolic dysfunction. Euvolemic on exam  Leukocytosis and thrombocytopenia: Stable.  Bradycardia: Improved  Suspected ileus:Resolved  MRSA colonization of nares: Completed Bactroban    DVT prophylaxis: Heparin Code Status: DNR Family Communication: None at bedside Disposition Plan: will d/c to SNF pending authorization at [facility] resource Exp date to d/c: 3/56/21  Barriers for d/c:covid test pending.  Also needs to sit in a recliner at least 1 hour today before we can discharge her to make sure she is able to sit in recliner for her dialysis as high risk of being sent back to the hospital Possible d/c in am if sits in recliner today        LOS: 18 days   Time spent: 99mn with >50% on coc    SNolberto Hanlon MD [facility] Pager [phone]  If 7PM-7AM, please contact night-coverage [email or web address] Password [email or web address]3/12/2019, 3:33 PM Patient ID: MBERNADINE MELECIO female   DOB: 409/01/1941 79y.o.   MRN: [id number]

## 2019-09-24 LAB — MAGNESIUM: Magnesium: 2.1 mg/dL (ref 1.7–2.4)

## 2019-09-24 LAB — GLUCOSE, CAPILLARY
Glucose-Capillary: 115 mg/dL — ABNORMAL HIGH (ref 70–99)
Glucose-Capillary: 131 mg/dL — ABNORMAL HIGH (ref 70–99)
Glucose-Capillary: 132 mg/dL — ABNORMAL HIGH (ref 70–99)
Glucose-Capillary: 136 mg/dL — ABNORMAL HIGH (ref 70–99)

## 2019-09-24 LAB — PHOSPHORUS: Phosphorus: 4.6 mg/dL (ref 2.5–4.6)

## 2019-09-24 MED ORDER — PRO-STAT SUGAR FREE PO LIQD: 30.0000 mL | Freq: Two times a day (BID) | ORAL | 0 refills | Status: DC

## 2019-09-24 MED ORDER — LACOSAMIDE 10 MG/ML PO SOLN: 100.0000 mg | Freq: Two times a day (BID) | ORAL | Status: DC

## 2019-09-24 MED ORDER — B COMPLEX-C PO TABS: 1.0000 | ORAL_TABLET | Freq: Every day | ORAL | Status: DC

## 2019-09-24 MED ORDER — LACOSAMIDE 50 MG PO TABS: 100.0000 mg | ORAL_TABLET | Freq: Two times a day (BID) | ORAL | Status: DC

## 2019-09-24 MED ORDER — FREE WATER: 50.0000 mL | Status: DC

## 2019-09-24 MED ORDER — EPOETIN ALFA 10000 UNIT/ML IJ SOLN: 10000.0000 [IU] | INTRAMUSCULAR | 0 refills | Status: DC

## 2019-09-24 MED ORDER — OXYBUTYNIN CHLORIDE 5 MG PO TABS: 5.0000 mg | ORAL_TABLET | Freq: Three times a day (TID) | ORAL | Status: DC

## 2019-09-24 MED ORDER — HEPARIN SODIUM (PORCINE) 5000 UNIT/ML IJ SOLN: 5000.0000 [IU] | Freq: Two times a day (BID) | INTRAMUSCULAR | 0 refills | Status: DC

## 2019-09-24 MED ORDER — GABAPENTIN 300 MG PO CAPS: 600.0000 mg | ORAL_CAPSULE | Freq: Three times a day (TID) | ORAL | Status: DC

## 2019-09-24 MED ORDER — VITAMIN D 1000 UNITS PO TABS: 1000.0000 [IU] | ORAL_TABLET | Freq: Every day | ORAL | Status: DC

## 2019-09-24 MED ORDER — CALCIUM ACETATE (PHOS BINDER) 667 MG PO CAPS: 1334.0000 mg | ORAL_CAPSULE | Freq: Three times a day (TID) | ORAL | 0 refills | Status: DC

## 2019-09-24 MED ORDER — AMLODIPINE BESYLATE 5 MG PO TABS
5.0000 mg | ORAL_TABLET | Freq: Every day | ORAL | Status: DC
  Administered 2019-09-24: 5 mg
  Filled 2019-09-24: qty 1

## 2019-09-24 MED ORDER — RENA-VITE PO TABS: 1.0000 | ORAL_TABLET | Freq: Every day | ORAL | 0 refills | Status: DC

## 2019-09-24 MED ORDER — FERROUS SULFATE 325 (65 FE) MG PO TBEC: 325.0000 mg | DELAYED_RELEASE_TABLET | Freq: Every day | ORAL | 3 refills | Status: DC

## 2019-09-24 MED ORDER — LACOSAMIDE 100 MG PO TABS: 100.0000 mg | ORAL_TABLET | Freq: Two times a day (BID) | ORAL | Status: DC

## 2019-09-24 MED ORDER — AMLODIPINE BESYLATE 5 MG PO TABS: 5.0000 mg | ORAL_TABLET | Freq: Every day | ORAL | Status: DC

## 2019-09-24 MED ORDER — ESOMEPRAZOLE MAGNESIUM 40 MG PO CPDR: 40.0000 mg | DELAYED_RELEASE_CAPSULE | Freq: Every day | ORAL | Status: DC

## 2019-09-24 NOTE — Progress Notes (Signed)
Discharge order received in [facility] to discharge the pt to SNF; [facility] previously established discharge packet for the pt to be transferred to [facility]; telephone report called to [place] [phone] spoke with [place] RN;  Pt's discharge pending arrival of EMS for nonemergency transport; NT previously put the pt in a transfer packet for EMS

## 2019-09-24 NOTE — TOC Transition Note (Signed)
Transition of Care [facility]) - CM/SW Discharge Note   Patient Details  Name: Barbara [name] MRN: [id number] Date of Birth: 1941-04-19  [facility] CM/SW Contact:  Boris Sharper, LCSW Phone Number:[phone] 09/24/2019, 11:07 AM   Clinical Narrative:    Pt is medically stable for discharge. CSW notified pt's daughter of discharge to [facility]. CSW notified admissions coordinator at [facility] of discharge. Pt will be in room 803, call to report number is [phone] ask for 800 hall nurse. CSW will fax signed discharge summary to facility @ [phone] and arrange EMS transport.   Final next level of care: Skilled Nursing Facility Barriers to Discharge: No Barriers Identified   Patient Goals and CMS Choice Patient states their goals for this hospitalization and ongoing recovery are:: Husband answers for pt I don't know what happen she was doing well then she wasn't. Did she have a stroke? CMS Medicare.gov Compare Post Acute Care list provided to:: Patient Represenative (must comment)(daughter) Choice offered to / list presented to : Adult Children  Discharge Placement              Patient chooses bed at: [facility] [place] Patient to be transferred to facility by: EMS Name of family member notified: Norberto Sorenson Patient and family notified of of transfer: 09/24/19  Discharge Plan and Services In-house Referral: Clinical Social Work                                   Social Determinants of Health (SDOH) Interventions     Readmission Risk Interventions No flowsheet data found.

## 2019-09-24 NOTE — Plan of Care (Signed)

## 2019-09-24 NOTE — Progress Notes (Signed)
[facility]  ROUNDING NOTE   Subjective:  Patient seen at bedside. Resting comfortably. Due for dialysis again tomorrow.  Objective:  Vital signs in last 24 hours:  Temp:  [98.6 F (37 C)-98.9 F (37.2 C)] 98.6 F (37 C) (03/07 0748) Pulse Rate:  [76-90] 85 (03/07 0748) Resp:  [16-20] 20 (03/07 0748) BP: (150-177)/(60-77) 159/60 (03/07 0748) SpO2:  [96 %-100 %] 96 % (03/07 0748) Weight:  [78.8 kg] 78.8 kg (03/07 0600)  Weight change: 1.008 kg Filed Weights   09/22/19 0700 09/23/19 0500 09/24/19 0600  Weight: 77.6 kg 77.8 kg 78.8 kg    Intake/Output: I/O last 3 completed shifts: In: 2560.6 [I.V.:245.5; Other:1630; NG/GT:377; IV Piggyback:308.1] Out: -    Intake/Output this shift:  Total I/O In: 1048 [NG/GT:973; IV Piggyback:75] Out: 1 [Stool:1]   Physical Exam: General:  Laying in the bed, no acute distress  HEENT:  Anicteric, moist oral mucous membranes  Lungs:   Normal breathing effort, clear bilateral  Heart:  S1S2, no rubs  Abdomen:   Soft, nontender, PEG tube in place  Extremities:  No peripheral edema  Neurologic:  Awake, alert  Skin:  Warm  Access:  Left arm AV graft    Basic Metabolic Panel: Recent Labs  Lab 09/18/19 0305 09/18/19 0305 09/19/19 0939 09/20/19 0337 09/22/19 0529 09/23/19 0453 09/24/19 0555  NA 141  --  140 142 137  --   --   K 4.0  --  4.7 3.5 3.2* 3.6  --   CL 99  --  101 100 97*  --   --   CO2 24  --  21* 24 26  --   --   GLUCOSE 99  --  88 124* 131*  --   --   BUN 98*  --  47* 61* 51*  --   --   CREATININE 10.04*  --  6.49* 7.91* 7.64*  --   --   CALCIUM 8.6*   < > 8.5* 8.7* 8.3*  --   --   MG 2.6*   < > 2.2 2.1 2.0 2.1 2.1  PHOS 7.4*   < > 5.8* 7.4* 6.6* 4.3 4.6   < > = values in this interval not displayed.    Liver Function Tests: No results for input(s): AST, ALT, ALKPHOS, BILITOT, PROT, ALBUMIN in the last 168 hours. No results for input(s): LIPASE, AMYLASE in the last 168 hours. No results for  input(s): AMMONIA in the last 168 hours.  CBC: Recent Labs  Lab 09/18/19 0305 09/19/19 0939 09/20/19 0337 09/22/19 0535  WBC 12.8* 10.7* 12.2* 13.2*  HGB 8.6* 10.1* 8.9* 8.1*  HCT 29.1* 34.4* 29.8* 26.9*  MCV 102.5* 103.3* 101.4* 101.5*  PLT 527* 429* 479* 349    Cardiac Enzymes: No results for input(s): CKTOTAL, CKMB, CKMBINDEX, TROPONINI in the last 168 hours.  BNP: Invalid input(s): POCBNP  CBG: Recent Labs  Lab 09/23/19 1638 09/23/19 2048 09/24/19 0018 09/24/19 0745 09/24/19 1144  GLUCAP 117* 107* 131* 132* 115*    Microbiology: Results for orders placed or performed during the hospital encounter of 09/05/19  MRSA PCR Screening     Status: Abnormal   Collection Time: 09/06/19  7:50 AM   Specimen: Nasal Mucosa; Nasopharyngeal  Result Value Ref Range Status   MRSA by PCR POSITIVE (A) NEGATIVE Final    Comment:        The GeneXpert MRSA Assay (FDA approved for NASAL specimens only), is one component of a comprehensive  MRSA colonization surveillance program. It is not intended to diagnose MRSA infection nor to guide or monitor treatment for MRSA infections. RESULT CALLED TO, READ BACK BY AND VERIFIED WITH: MEGAN SHEFFIELD AT 0931 ON 09/06/2019 [place]. Performed at [facility], [address] [place], [place] [address]   CULTURE, BLOOD (ROUTINE X 2) w Reflex to ID Panel     Status: None (Preliminary result)   Collection Time: 09/22/19  2:00 PM   Specimen: BLOOD  Result Value Ref Range Status   Specimen Description BLOOD BLOOD RIGHT HAND  Final   Special Requests   Final    BOTTLES DRAWN AEROBIC AND ANAEROBIC Blood Culture adequate volume   Culture   Final    NO GROWTH 2 DAYS Performed at [facility], [address] [place], [place] [address]    Report Status PENDING  Incomplete  CULTURE, BLOOD (ROUTINE X 2) w Reflex to ID Panel     Status: None (Preliminary result)   Collection Time: 09/22/19  3:44 PM   Specimen: BLOOD  Result  Value Ref Range Status   Specimen Description BLOOD BLOOD RIGHT HAND  Final   Special Requests   Final    BOTTLES DRAWN AEROBIC ONLY Blood Culture results may not be optimal due to an inadequate volume of blood received in culture bottles   Culture   Final    NO GROWTH 2 DAYS Performed at [facility], [address] [place], [place] [address]    Report Status PENDING  Incomplete  Respiratory Panel by RT PCR (Flu A&B, Covid) - Nasopharyngeal Swab     Status: None   Collection Time: 09/23/19  2:06 PM   Specimen: Nasopharyngeal Swab  Result Value Ref Range Status   SARS Coronavirus 2 by RT PCR NEGATIVE NEGATIVE Final    Comment: (NOTE) SARS-CoV-2 target nucleic acids are NOT DETECTED. The SARS-CoV-2 RNA is generally detectable in upper respiratoy specimens during the acute phase of infection. The lowest concentration of SARS-CoV-2 viral copies this assay can detect is 131 copies/mL. A negative result does not preclude SARS-Cov-2 infection and should not be used as the sole basis for treatment or other patient management decisions. A negative result may occur with  improper specimen collection/handling, submission of specimen other than nasopharyngeal swab, presence of viral mutation(s) within the areas targeted by this assay, and inadequate number of viral copies (<131 copies/mL). A negative result must be combined with clinical observations, patient history, and epidemiological information. The expected result is Negative. Fact Sheet for Patients:  PinkCheek.be Fact Sheet for Healthcare Providers:  GravelBags.it This test is not yet ap proved or cleared by the [place] FDA and  has been authorized for detection and/or diagnosis of SARS-CoV-2 by FDA under an Emergency Use Authorization (EUA). This EUA will remain  in effect (meaning this test can be used) for the duration of the COVID-19 declaration under  Section 564(b)(1) of the Act, 21 U.S.C. section 360bbb-3(b)(1), unless the authorization is terminated or revoked sooner.    Influenza A by PCR NEGATIVE NEGATIVE Final   Influenza B by PCR NEGATIVE NEGATIVE Final    Comment: (NOTE) The Xpert Xpress SARS-CoV-2/FLU/RSV assay is intended as an aid in  the diagnosis of influenza from Nasopharyngeal swab specimens and  should not be used as a sole basis for treatment. Nasal washings and  aspirates are unacceptable for Xpert Xpress SARS-CoV-2/FLU/RSV  testing. Fact Sheet for Patients: PinkCheek.be Fact Sheet for Healthcare Providers: GravelBags.it This test is not  yet approved or cleared by the [place] and  has been authorized for detection and/or diagnosis of SARS-CoV-2 by  FDA under an Emergency Use Authorization (EUA). This EUA will remain  in effect (meaning this test can be used) for the duration of the  Covid-19 declaration under Section 564(b)(1) of the Act, 21  U.S.C. section 360bbb-3(b)(1), unless the authorization is  terminated or revoked. Performed at [facility], [address] [place], [place] [address]     Coagulation Studies: No results for input(s): LABPROT, INR in the last 72 hours.  Urinalysis: No results for input(s): COLORURINE, LABSPEC, PHURINE, GLUCOSEU, HGBUR, BILIRUBINUR, KETONESUR, PROTEINUR, UROBILINOGEN, NITRITE, LEUKOCYTESUR in the last 72 hours.  Invalid input(s): APPERANCEUR    Imaging: DG Chest Port 1 View  Result Date: 09/22/2019 CLINICAL DATA:  79 year old female with history of fever. EXAM: PORTABLE CHEST 1 VIEW COMPARISON:  Chest x-ray 09/17/2019. FINDINGS: Previously noted feeding tube has been removed. Vascular stents projecting over the left subclavian and left axillary regions. Lung volumes are low. No consolidative airspace disease. No pleural effusions. No suspicious appearing pulmonary nodules or masses are noted.  No pneumothorax. No evidence of pulmonary edema. Mild cardiomegaly. Upper mediastinal contours are within normal limits. Aortic atherosclerosis. IMPRESSION: 1. Postoperative changes and support apparatus, as above. 2. Low lung volumes without radiographic evidence of acute cardiopulmonary disease. 3. Mild cardiomegaly. 4. Aortic atherosclerosis. Electronically Signed   By: Vinnie Langton M.D.   On: 09/22/2019 14:14     Medications:   . sodium chloride    . dextrose Stopped (09/20/19 1304)  . famotidine (PEPCID) IV Stopped (09/23/19 1753)  . lacosamide (VIMPAT) IV Stopped (09/24/19 1036)   . amLODipine  5 mg Per Tube Daily  . B-complex with vitamin C  1 tablet Per Tube QHS  . chlorhexidine  15 mL Mouth/Throat QID  . epoetin (EPOGEN/PROCRIT) injection  10,000 Units Intravenous Q M,W,F-HD  . feeding supplement (NEPRO CARB STEADY)  1,000 mL Oral Q24H  . feeding supplement (PRO-STAT SUGAR FREE 64)  30 mL Per Tube BID  . free water  50 mL Per Tube Q4H  . heparin injection (subcutaneous)  5,000 Units Subcutaneous Q12H  . loperamide HCl  4 mg Oral Once  . mouth rinse  15 mL Mouth Rinse BID   acetaminophen (TYLENOL) oral liquid 160 mg/5 mL, acetaminophen, acetaminophen, bisacodyl, hydrALAZINE, ipratropium-albuterol, ondansetron **OR** ondansetron (ZOFRAN) IV  Assessment/ Plan:    [name] is a 79 y.o. black female with end stage renal disease on hemodialysis, hypertension, diabetes mellitus type II, diabetic neuropathy, seizure disorder who was admitted for left hip repair and unfortunate developed seizure episode postoperatively and subsequently intubated. Required vasopressors, CRRT.   [facility] M[facility] LUE AVG 79.5kg   1.  ESRD on HD MWF.  Patient will be due for hemodialysis again tomorrow as an outpatient.  2.  Status post elective left total hip arthroplasty Performed on September 05, 2019 by Dr. Roland Rack   3.  Anemia of chronic kidney disease.   Lab Results   Component Value Date   HGB 8.1 (L) 09/22/2019  She will need to be continued on erythropoietin stimulating agents as an outpatient.  4.  Secondary hyperparathyroidism.   Lab Results  Component Value Date   PTH 310 (H) 09/06/2019   CALCIUM 8.3 (L) 09/22/2019   CAION 0.85 (LL) 09/05/2019   PHOS 4.6 09/24/2019  Phosphorus currently under good control.  Continue periodically monitor.  5. Encephalopathy: with  seizure disorder.   Slow improvement  6.  Acute respiratory failure:  Required intubation this admission Continues to do well post extubation.  7.  Poor p.o. intake Status post PEG tube placement by Dr. Alice Reichert September 20, 2019 Maintain the patient on tube feeds.    LOS: 19 Alaysha Jefcoat 3/7/202112:04 PM

## 2019-09-24 NOTE — Discharge Summary (Signed)
RAFFAELA LADLEY ZDG:[id number] DOB: 10/19/1940 DOA: 09/05/2019  PCP: [name], MD  Admit date: 09/05/2019 Discharge date: 09/24/2019  [facility]: [facility]: [facility]  Recommendations for Outpatient Follow-up:  1. Follow up with PCP in 1 week 2. Please obtain BMP/CBC in one week 3. Follow-up with hemodialysis which is Monday Wednesday Friday 4. Follow-up with Milagros Evener, MD orthopedics in one week-need to discuss heparin sq on that visit too     Discharge Condition:Stable CODE STATUS: DNR Diet recommendation: Tube feeding Nepro carb steady at 46m/hr with free water 263mQ4hrs Brief/Interim Summary: Kajsa L Conyersis an [age].o.femalemedical history significant of hypertension, hyperlipidemia, diet-controlled diabetes, COPD, asthma, GERD, ESRD-HD (MWF), OSA, CAD, bladder cancer, iron deficiency anemia. She was admitted by Dr. BoKizzie Baneor lefttotal hip arthroplasty on September 05, 2019. She developed acute respiratory distress after surgery. She was subsequently transferred to the ICU for acute hypoxemic and hypercapnic respiratory failure. Initially, she was treated with BiPAP. However, she failed BiPAP therapy and required intubation and mechanical ventilation in the ICU. She was also treated with IV antibiotics for aspiration pneumonia.  She was also seen by the nephrologist for end-stage renal disease. She was initially placed on CRRT and then transition to hemodialysis. She required vasopressors for hypotension.  She was seen by the neurologist for altered mental status and seizure activity. MRI brain did not show any acute stroke. Neurologist recommended Vimpat and monitoring for seizure  She was also seen by the cardiologist because of bradycardia and sinus pauses on telemetry. Per cardiologist, there was no indication for permanent pacemaker and he recommended a conservative approach.  She was extubated on 09/08/2019. Her condition was slowly improving so she  was was transferred to the medical floor for continued medical management. Her acute toxic metabolic encephalopathy improved.  At times it does waxes and wane usually worse after dialysis.  She has been receiving dialysis here.  Yesterday she sat in the recliner for more than an hour and tolerated.  She is aware that she needs to sit in a recliner for dialysis.  She had an echocardiogram on September 07, 2019 found with EF of 60 to 6559%grade 1 diastolic dysfunction.  She is euvolemic on exam.  She has dysphagia with aspiration risk.  She was treated with IV antibiotics for aspiration pneumonia which she completed antibiotics on 3/2.  Did have fever 3 days ago however work-up has been negative and has been afebrile more than 48 hours. She had PEG palcement on 09/20/19 and was Tube feeding Nepro carb steady at 4017mr with free water 38m63mhrs. She is stable to be discharge to [facility]. Will need to follow up with dialysis tomorrow .  Discharge Diagnoses:  Principal Problem:   Dysphagia Active Problems:   Anemia in chronic kidney disease   COPD (chronic obstructive pulmonary disease) (HCC)   CAD in native artery   Benign essential HTN   Acid reflux   Convulsions (HCC)   Status post total hip replacement, left   Type II diabetes mellitus with renal manifestations (HCC)   ESRD on dialysis (HCC)MotleyAspiration pneumonia (HCC)MeggettAcute encephalopathy   Goals of care, counseling/discussion   Pressure injury of skin    Discharge Instructions  Discharge Instructions    Call MD for:  temperature >100.4   Complete by: As directed    Diet - low sodium heart healthy   Complete by: As directed    Increase activity slowly   Complete by: As  directed      Allergies as of 09/24/2019      Reactions   Glipizide Er Other (See Comments)   Hypoglycemia   Percocet [oxycodone-acetaminophen] Hives      Medication List    STOP taking these medications   acetaminophen 500 MG tablet Commonly known as:  TYLENOL   ascorbic acid 500 MG tablet Commonly known as: VITAMIN C   feeding supplement (NEPRO CARB STEADY) Liqd   furosemide 40 MG tablet Commonly known as: LASIX   ibuprofen 200 MG tablet Commonly known as: ADVIL   loperamide 1 MG/5ML solution Commonly known as: IMODIUM   losartan 50 MG tablet Commonly known as: COZAAR     TAKE these medications   Accu-Chek Aviva Plus w/Device Kit (USE TO MONITOR BLOOD SUGARS.)   albuterol 108 (90 Base) MCG/ACT inhaler Commonly known as: VENTOLIN HFA Inhale 2 puffs into the lungs every 6 (six) hours as needed for wheezing or shortness of breath.   amLODipine 5 MG tablet Commonly known as: NORVASC Place 1 tablet (5 mg total) into feeding tube daily.   atorvastatin 40 MG tablet Commonly known as: LIPITOR Take 40 mg by mouth every evening.   B-complex with vitamin C tablet Place 1 tablet into feeding tube at bedtime.   calcium acetate 667 MG capsule Commonly known as: PHOSLO Take 2 capsules (1,334 mg total) by mouth 3 (three) times daily with meals. What changed: when to take this   calcium carbonate 500 MG chewable tablet Commonly known as: TUMS - dosed in mg elemental calcium Chew 1 tablet by mouth daily.   cholecalciferol 1000 units tablet Commonly known as: VITAMIN D Take 1 tablet (1,000 Units total) by mouth daily.   cinacalcet 30 MG tablet Commonly known as: SENSIPAR Take 30 mg by mouth daily. With largest meal   epoetin alfa 10000 UNIT/ML injection Commonly known as: EPOGEN Inject 1 mL (10,000 Units total) into the vein every Monday, Wednesday, and Friday with hemodialysis. Start taking on: September 25, 2019   esomeprazole 40 MG capsule Commonly known as: NEXIUM Take 1 capsule (40 mg total) by mouth daily after breakfast.   feeding supplement (PRO-STAT SUGAR FREE 64) Liqd Place 30 mLs into feeding tube 2 (two) times daily.   ferrous sulfate 325 (65 FE) MG EC tablet Take 1 tablet (325 mg total) by mouth daily  after breakfast.   free water Soln Place 50 mLs into feeding tube every 4 (four) hours.   gabapentin 300 MG capsule Commonly known as: NEURONTIN Place 2 capsules (600 mg total) into feeding tube 3 (three) times daily. What changed: how to take this   heparin 5000 UNIT/ML injection Inject 1 mL (5,000 Units total) into the skin every 12 (twelve) hours.   Lacosamide 100 MG Tabs Place 1 tablet (100 mg total) into feeding tube 2 (two) times daily.   multivitamin Tabs tablet Place 1 tablet into feeding tube at bedtime. What changed: how to take this   NON FORMULARY Place 1 Units into the nose at bedtime. CPAP Time of use 2100-0600   oxybutynin 5 MG tablet Commonly known as: DITROPAN Place 1 tablet (5 mg total) into feeding tube 3 (three) times daily. What changed: how to take this   OXYGEN Inhale 2 L into the lungs at bedtime.            Durable Medical Equipment  (From admission, onward)         Start     Ordered   09/05/19  3  DME Bedside commode  Once    Question:  Patient needs a bedside commode to treat with the following condition  Answer:  Status post total hip replacement, left   09/05/19 1427   09/05/19 1427  DME 3 n 1  Once     09/05/19 1427   09/05/19 1427  DME Walker rolling  Once    Question Answer Comment  Walker: With [place]   Patient needs a walker to treat with the following condition Status post total hip replacement, left      09/05/19 1427          Contact information for follow-up providers    Poggi, Marshall Cork, MD Follow up in 1 week(s).   Specialty: Orthopedic Surgery Contact information: [place] [phone]            Contact information for after-discharge care    Destination    [facility] Preferred SNF .   Service: Skilled Nursing Contact information: [place] [phone]                  Allergies  Allergen Reactions  . Glipizide Er Other (See Comments)    Hypoglycemia   . Percocet [Oxycodone-Acetaminophen] Hives    Consultations:  PCCM, ortho, cardiology , nephrology , GI   Procedures/Studies: EEG  Result Date: 09/13/2019 Lora Havens, MD     09/13/2019  5:50 PM Patient Name: ROSELL KHOURI MRN: [id number] Epilepsy Attending: Lora Havens Referring Physician/Provider: Dr Karena Addison Aroor Date: 09/13/2019 Duration: 29.14 mins Patient history: 79 y.o.femalewith multiple medical problems including ESRD on hemodialysis who was admitted 2/16 for left total hip arthroplasty.Neurology reconsulted for worsening speech and mental status. Level of alertness: awake AEDs during EEG study: Vimpat Technical aspects: This EEG study was done with scalp electrodes positioned according to the 10-20 International system of electrode placement. Electrical activity was acquired at a sampling rate of 500Hz  and reviewed with a high frequency filter of 70Hz  and a low frequency filter of 1Hz . EEG data were recorded continuously and digitally stored. DESCRIPTION: When patient appeared to be resting/drowsy, EEG showed continuous generalized 3-6hz  theta-delta slowing. When patient was awake/stimulated, EEG showed triphasic waves, generalized, maximal bifrontal at times at 1-1.5hz . Sharp transients were seen in left frontocentral region. Hyperventilation and photic stimulation were not performed. ABNORMALITY - Continuous slow, generalized - Triphasic waves, generalized, maximal bifrontal IMPRESSION: This study is suggestive of moderate to severe diffuse encephalopathy, non specific to etiology but could be secondary to toxic-metabolic causes. No seizures or definite  epileptiform discharges were seen throughout the recording. Lora Havens   EEG  Result Date: 09/06/2019 Alexis Goodell, MD     09/06/2019  5:06 PM ELECTROENCEPHALOGRAM REPORT Patient: CHAITRA MAST       Room #: IC01A-AA EEG No.  ID: [id number] Age: 79 y.o.        Sex: female Requesting Physician: Kasa Report Date:  09/06/2019       Interpreting Physician: Alexis Goodell History: TABATHIA KNOCHE is an 79 y.o. female with new onset seizures and altered mental status Medications: Lipitor, Neurontin, Ditropan, Fentanyl, Neosynephrine Conditions of Recording:  This is a 21 channel routine scalp EEG performed with bipolar and monopolar montages arranged in accordance to the international 10/20 system of electrode placement. One channel was dedicated to EKG recording. The patient is in the intubated and sedated state. Description:  The background activity is slow and poorly organized.  It consists of a low voltage polymorphic delta delta activity this is diffusely distributed.  There are intermittent generalized periods of attenuation noted as well occasionally during the recording.  These periods of attenuation are short, lasting up to 2 seconds.  Also, rare generalized polyspike activity is noted with frontal predominance. Hyperventilation and intermittent photic stimulation were not performed. IMPRESSION: This is an abnormal electroencephalogram secondary to discontinuous general background slowing with rare, superimposed, generalized polyspike activity with frontal predominance.  This suggest an underlying diffuse cerebral disturbance and frontal, cortical irritability.  No electrographic seizures are noted.  Alexis Goodell, MD Neurology [phone] 09/06/2019, 4:56 PM   DG Chest 1 View  Result Date: 09/17/2019 CLINICAL DATA:  Feeding tube placement EXAM: CHEST  1 VIEW COMPARISON:  09/17/2019 FINDINGS: 0402 hours. The cardio pericardial silhouette is enlarged. There is pulmonary vascular congestion without overt pulmonary edema. Interstitial markings are diffusely coarsened with chronic features. Prominent skin fold noted over the left hemithorax. Feeding tube is coiled in the esophagus with the tip of the feeding tube at the esophagogastric  junction. Telemetry leads overlie the chest. IMPRESSION: A relatively large length of the feeding tube is coiled in the esophagus with the tip at the esophagogastric junction. Given the large amount of redundant tubing in the esophagus, this should be pulled back with repeat placement. These results will be called to the ordering clinician or representative by the Radiologist Assistant, and communication documented in the PACS or zVision Dashboard. Electronically Signed   By: Misty Stanley M.D.   On: 09/17/2019 04:26   DG Chest 1 View  Result Date: 09/17/2019 CLINICAL DATA:  Enteric catheter placement, patient pulled on NG tube EXAM: CHEST  1 VIEW COMPARISON:  09/17/2019 FINDINGS: Single frontal view of the chest demonstrates a weighted tip of an enteric feeding catheter overlying the gastroesophageal junction. Cardiac silhouette is stable. Chronic central vascular congestion and diffuse interstitial prominence unchanged. Stable patchy consolidation at the lung bases. No effusion or pneumothorax. Vascular stent unchanged. IMPRESSION: 1. Enteric catheter overlying gastroesophageal junction. 2. Stable vascular congestion and bibasilar consolidation. Electronically Signed   By: Randa Ngo M.D.   On: 09/17/2019 03:09   DG Chest 1 View  Result Date: 09/17/2019 CLINICAL DATA:  Enteric catheter placement, previous abnormal chest x-ray EXAM: CHEST  1 VIEW COMPARISON:  09/13/2019 FINDINGS: Frontal view of the chest was obtained, with the patient markedly rotated to the right. Weighted tip of an enteric catheter overlies the distal thoracic esophagus. The tip of the catheter is at least 4 cm from the gastroesophageal junction. The cardiac silhouette is enlarged but stable. There is persistent central vascular congestion. Patchy consolidation at the lung bases slightly improved. Evaluation of the right lung base limited due to patient rotation. No large effusion or pneumothorax. Stable left-sided vascular stent.  IMPRESSION: 1. Enteric catheter as above. Recommended advancing at least 4 cm to ensure intragastric placement. 2. Central vascular congestion and patchy bibasilar consolidation, slightly improved since prior study. Evaluation limited by positioning. Electronically Signed   By: Randa Ngo M.D.   On: 09/17/2019 00:30   DG Chest 1 View  Result Date: 09/06/2019 CLINICAL DATA:  Status post intubation. EXAM: CHEST  1 VIEW COMPARISON:  Single-view of the chest earlier today. FINDINGS: New endotracheal tube is in place with the tip in good position 2.5 cm above the carina. NG tube courses into the stomach and below the inferior margin the film. Bibasilar atelectasis  again seen. There is cardiomegaly. Vascular stent noted. IMPRESSION: ETT in good position. No other change compared to the exam earlier today. Electronically Signed   By: Inge Rise M.D.   On: 09/06/2019 14:22   DG Abd 1 View  Result Date: 09/09/2019 CLINICAL DATA:  NG tube placement. EXAM: ABDOMEN - 1 VIEW COMPARISON:  09/06/2019 FINDINGS: Nasogastric tube has been advanced with tip over the stomach in the left mid abdomen. Visualized bowel gas pattern is nonobstructive. No free peritoneal air. Partially visualized stent in the region of the left brachiocephalic vein. Remainder of the exam is unchanged. IMPRESSION: Interval advancement of nasogastric tube with tip over the stomach in the left mid abdomen. Nonobstructive bowel gas pattern. Electronically Signed   By: Marin Olp M.D.   On: 09/09/2019 09:32   DG Abd 1 View  Result Date: 09/06/2019 CLINICAL DATA:  OG tube placement EXAM: ABDOMEN - 1 VIEW COMPARISON:  None. FINDINGS: Enteric tube passes into the stomach. Bowel gas pattern is unremarkable. IMPRESSION: Enteric tube is within the stomach. Electronically Signed   By: Macy Mis M.D.   On: 09/06/2019 14:21   MR BRAIN WO CONTRAST  Result Date: 09/07/2019 CLINICAL DATA:  Ataxia. Unresponsive. Seizure like activity.  Metabolic acidosis. EXAM: MRI HEAD WITHOUT CONTRAST TECHNIQUE: Multiplanar, multiecho pulse sequences of the brain and surrounding structures were obtained without intravenous contrast. COMPARISON:  05/14/2019 FINDINGS: Brain: Diffusion imaging does not show any acute or subacute infarction. No focal abnormality affects the brainstem. No focal cerebellar finding. Cerebral hemispheres show age related atrophy with chronic small-vessel ischemic changes of the white matter. No large vessel territory infarction. No mass lesion, hemorrhage, hydrocephalus or extra-axial collection. Vascular: Major vessels at the base of the brain show flow. Skull and upper cervical spine: Negative Sinuses/Orbits: Clear/normal Other: None IMPRESSION: No change since the study of last October. No acute or reversible finding. Age related atrophy. Chronic small-vessel ischemic changes. Electronically Signed   By: Nelson Chimes M.D.   On: 09/07/2019 01:48   DG Chest Port 1 View  Result Date: 09/22/2019 CLINICAL DATA:  79 year old female with history of fever. EXAM: PORTABLE CHEST 1 VIEW COMPARISON:  Chest x-ray 09/17/2019. FINDINGS: Previously noted feeding tube has been removed. Vascular stents projecting over the left subclavian and left axillary regions. Lung volumes are low. No consolidative airspace disease. No pleural effusions. No suspicious appearing pulmonary nodules or masses are noted. No pneumothorax. No evidence of pulmonary edema. Mild cardiomegaly. Upper mediastinal contours are within normal limits. Aortic atherosclerosis. IMPRESSION: 1. Postoperative changes and support apparatus, as above. 2. Low lung volumes without radiographic evidence of acute cardiopulmonary disease. 3. Mild cardiomegaly. 4. Aortic atherosclerosis. Electronically Signed   By: Vinnie Langton M.D.   On: 09/22/2019 14:14   DG Chest Port 1 View  Result Date: 09/13/2019 CLINICAL DATA:  Shortness of breath. EXAM: PORTABLE CHEST 1 VIEW COMPARISON:   Radiographs 09/13/2019 and 09/06/2019. CT 08/24/2019. FINDINGS: 1435 hours. The heart size and mediastinal contours are stable with a stable left subclavian venous stent. There are persistent low lung volumes with worsening right-greater-than-left airspace opacities, suspicious for pneumonia, possibly on the basis of aspiration. No edema or significant pleural effusion. The bones appear unchanged. IMPRESSION: Worsening right-greater-than-left basilar airspace opacities suspicious for pneumonia, possibly on the basis of aspiration. Electronically Signed   By: Richardean Sale M.D.   On: 09/13/2019 16:56   DG Chest Port 1 View  Result Date: 09/13/2019 CLINICAL DATA:  NG tube placement EXAM: PORTABLE CHEST  1 VIEW COMPARISON:  09/06/2019 FINDINGS: Mild cardiomegaly. Left brachiocephalic and axillary vascular stents. No esophagogastric tube or other support apparatus are identified internal to the patient. Mild, diffuse interstitial pulmonary opacity. IMPRESSION: 1. No esophagogastric tube or other support apparatus identified internal to the patient, presumably malpositioned and coiled within the oropharynx. 2. Mild cardiomegaly and mild, diffuse interstitial pulmonary opacity, unchanged compared to prior examination. No new airspace opacity. These results will be called to the ordering clinician or representative by the Radiologist Assistant, and communication documented in the PACS or zVision Dashboard. Electronically Signed   By: Eddie Candle M.D.   On: 09/13/2019 09:17   DG Chest Port 1 View  Result Date: 09/06/2019 CLINICAL DATA:  Respiratory failure. EXAM: PORTABLE CHEST 1 VIEW COMPARISON:  CT chest 08/24/2019 and chest radiograph 05/11/2019. FINDINGS: Trachea is midline. Heart is enlarged, stable. Thoracic aorta is calcified. A stent is seen in the left brachiocephalic and subclavian veins. Lungs are low in volume which may accentuate pulmonary markings. No airspace consolidation or definite pleural fluid.  IMPRESSION: 1. Low lung volumes likely accentuate pulmonary markings. Difficult to definitively exclude pulmonary edema. 2.  Aortic atherosclerosis (ICD10-I70.0). Electronically Signed   By: Lorin Picket M.D.   On: 09/06/2019 09:11   DG Abd Portable 1V  Result Date: 09/13/2019 CLINICAL DATA:  Nasogastric tube placement. EXAM: PORTABLE ABDOMEN - 1 VIEW COMPARISON:  September 09, 2019. FINDINGS: The bowel gas pattern is normal. Nasogastric tube is not visualized. No radio-opaque calculi or other significant radiographic abnormality are seen. IMPRESSION: Nasogastric tube is not visualized. No evidence of bowel obstruction or ileus. Electronically Signed   By: Marijo Conception M.D.   On: 09/13/2019 10:34   DG [facility] Dys Tube Plc W/Fl W/Rad  Result Date: 09/14/2019 CLINICAL DATA:  Dobbhoff tube needed. EXAM: NASO G TUBE PLACEMENT WITH FL AND WITH RAD CONTRAST:  None. FLUOROSCOPY TIME:  Fluoroscopy Time:  1 minutes 0 seconds Radiation Exposure Index (if provided by the fluoroscopic device): 14.1 mGy Number of Acquired Spot Images: 1 COMPARISON:  Portable chest and abdomen 09/13/2019 FINDINGS: Under fluoroscopic guidance a Dobbhoff tube was placed with its tip placed to the level of the distal stomach. No complications. Tube was secured to the nose with tape and patient sent back to the floor in stable condition. IMPRESSION: Successful fluoroscopically directed Dobbhoff tube placement. Electronically Signed   By: Marcello Moores  Register   On: 09/14/2019 10:01   ECHOCARDIOGRAM COMPLETE  Result Date: 09/07/2019    ECHOCARDIOGRAM REPORT   Patient Name:   SHANIQWA HORSMAN Date of Exam: 09/07/2019 Medical Rec #:  [id number]     Height:       66.0 in Accession #:    [id number]    Weight:       187.2 lb Date of Birth:  01-12-1941      BSA:          1.94 m Patient Age:    25 years      BP:           140/38 mmHg Patient Gender: F             HR:           90 bpm. Exam Location:  [facility] Procedure: 2D Echo, Cardiac Doppler and Color  Doppler Indications:     Dyspnea 786.09  History:         Patient has no prior history of Echocardiogram examinations.  CAD, COPD; Risk Factors:Hypertension and Diabetes. CKD.  Sonographer:     Charmayne Sheer RDCS (AE) Referring Phys:  [id number] Flora Lipps Diagnosing Phys: Ida Rogue MD  Sonographer Comments: Echo performed with patient supine and on artificial respirator. IMPRESSIONS  1. Left ventricular ejection fraction, by estimation, is 60 to 65%. The left ventricle has normal function. The left ventricle has no regional wall motion abnormalities. Left ventricular diastolic parameters are consistent with Grade I diastolic dysfunction (impaired relaxation).  2. Right ventricular systolic function is normal. The right ventricular size is normal. Tricuspid regurgitation signal is inadequate for assessing PA pressure. FINDINGS  Left Ventricle: Left ventricular ejection fraction, by estimation, is 60 to 65%. The left ventricle has normal function. The left ventricle has no regional wall motion abnormalities. The left ventricular internal cavity size was normal in size. There is  no left ventricular hypertrophy. Left ventricular diastolic parameters are consistent with Grade I diastolic dysfunction (impaired relaxation). Right Ventricle: The right ventricular size is normal. No increase in right ventricular wall thickness. Right ventricular systolic function is normal. Tricuspid regurgitation signal is inadequate for assessing PA pressure. Left Atrium: Left atrial size was normal in size. Right Atrium: Right atrial size was normal in size. Pericardium: There is no evidence of pericardial effusion. Mitral Valve: The mitral valve is normal in structure and function. Normal mobility of the mitral valve leaflets. No evidence of mitral valve regurgitation. No evidence of mitral valve stenosis. MV peak gradient, 4.5 mmHg. The mean mitral valve gradient is 2.0 mmHg. Tricuspid Valve: The tricuspid valve is  normal in structure. Tricuspid valve regurgitation is trivial. No evidence of tricuspid stenosis. Aortic Valve: The aortic valve was not well visualized. Aortic valve regurgitation is not visualized. Mild to moderate aortic valve sclerosis/calcification is present, without any evidence of aortic stenosis. Aortic valve mean gradient measures 9.0 mmHg.  Aortic valve peak gradient measures 15.1 mmHg. Aortic valve area, by VTI measures 2.62 cm. Pulmonic Valve: The pulmonic valve was normal in structure. Pulmonic valve regurgitation is not visualized. No evidence of pulmonic stenosis. Aorta: The aortic root is normal in size and structure. Venous: The inferior vena cava is normal in size with greater than 50% respiratory variability, suggesting right atrial pressure of 3 mmHg. IAS/Shunts: No atrial level shunt detected by color flow Doppler.  LEFT VENTRICLE PLAX 2D LVIDd:         4.04 cm  Diastology LVIDs:         2.89 cm  LV e' lateral:   6.96 cm/s LV PW:         0.74 cm  LV E/e' lateral: 10.5 LV IVS:        0.75 cm  LV e' medial:    4.68 cm/s LVOT diam:     1.90 cm  LV E/e' medial:  15.6 LV SV:         69.46 ml LV SV Index:   19.73 LVOT Area:     2.84 cm  LEFT ATRIUM         Index LA diam:    2.00 cm 1.03 cm/m  AORTIC VALVE                    PULMONIC VALVE AV Area (Vmax):    2.10 cm     PV Vmax:       1.71 m/s AV Area (Vmean):   2.14 cm     PV Vmean:      114.000 cm/s AV Area (VTI):  2.62 cm     PV VTI:        0.285 m AV Vmax:           194.00 cm/s  PV Peak grad:  11.7 mmHg AV Vmean:          142.000 cm/s PV Mean grad:  6.0 mmHg AV VTI:            0.265 m AV Peak Grad:      15.1 mmHg AV Mean Grad:      9.0 mmHg LVOT Vmax:         144.00 cm/s LVOT Vmean:        107.000 cm/s LVOT VTI:          0.245 m LVOT/AV VTI ratio: 0.92  AORTA Ao Root diam: 3.00 cm MITRAL VALVE MV Area (PHT): 3.77 cm     SHUNTS MV Peak grad:  4.5 mmHg     Systemic VTI:  0.24 m MV Mean grad:  2.0 mmHg     Systemic Diam: 1.90 cm MV Vmax:        1.06 m/s MV Vmean:      73.0 cm/s MV Decel Time: 201 msec MV E velocity: 72.80 cm/s MV A velocity: 105.00 cm/s MV E/A ratio:  0.69 Ida Rogue MD Electronically signed by Ida Rogue MD Signature Date/Time: 09/07/2019/12:56:14 PM    Final    DG HIP UNILAT W OR W/O PELVIS 2-3 VIEWS LEFT  Result Date: 09/05/2019 CLINICAL DATA:  Hip replacement, pain EXAM: DG HIP (WITH OR WITHOUT PELVIS) 2-3V LEFT COMPARISON:  07/28/2018 FINDINGS: Post left total hip arthroplasty. No evidence of complication. Residual postoperative air in the soft tissues. IMPRESSION: Post total left hip arthroplasty without evidence of complication. Electronically Signed   By: Macy Mis M.D.   On: 09/05/2019 10:50      Subjective:   Discharge Exam: Vitals:   09/24/19 0017 09/24/19 0748  BP: (!) 177/77 (!) 159/60  Pulse: 90 85  Resp: 18 20  Temp: 98.9 F (37.2 C) 98.6 F (37 C)  SpO2: 100% 96%   Vitals:   09/23/19 2050 09/24/19 0017 09/24/19 0600 09/24/19 0748  BP: (!) 157/63 (!) 177/77  (!) 159/60  Pulse: 82 90  85  Resp: 16 18  20   Temp:  98.9 F (37.2 C)  98.6 F (37 C)  TempSrc:  Oral  Oral  SpO2: 99% 100%  96%  Weight:   78.8 kg   Height:        General: Pt is alert, awake, not in acute distress Cardiovascular: RRR, S1/S2 +, no rubs, no gallops Respiratory: CTA bilaterally, no wheezing, no rhonchi Abdominal: Soft, NT, ND, bowel sounds + Extremities: no edema, no cyanosis    The results of significant diagnostics from this hospitalization (including imaging, microbiology, ancillary and laboratory) are listed below for reference.     Microbiology: Recent Results (from the past 240 hour(s))  CULTURE, BLOOD (ROUTINE X 2) w Reflex to ID Panel     Status: None (Preliminary result)   Collection Time: 09/22/19  2:00 PM   Specimen: BLOOD  Result Value Ref Range Status   Specimen Description BLOOD BLOOD RIGHT HAND  Final   Special Requests   Final    BOTTLES DRAWN AEROBIC AND ANAEROBIC  Blood Culture adequate volume   Culture   Final    NO GROWTH 2 DAYS Performed at [facility], [address], [place],  [address]    Report Status PENDING  Incomplete  CULTURE, BLOOD (ROUTINE X 2) w Reflex to ID Panel     Status: None (Preliminary result)   Collection Time: 09/22/19  3:44 PM   Specimen: BLOOD  Result Value Ref Range Status   Specimen Description BLOOD BLOOD RIGHT HAND  Final   Special Requests   Final    BOTTLES DRAWN AEROBIC ONLY Blood Culture results may not be optimal due to an inadequate volume of blood received in culture bottles   Culture   Final    NO GROWTH 2 DAYS Performed at [facility], [address] [place], [place] [address]    Report Status PENDING  Incomplete  Respiratory Panel by RT PCR (Flu A&B, Covid) - Nasopharyngeal Swab     Status: None   Collection Time: 09/23/19  2:06 PM   Specimen: Nasopharyngeal Swab  Result Value Ref Range Status   SARS Coronavirus 2 by RT PCR NEGATIVE NEGATIVE Final    Comment: (NOTE) SARS-CoV-2 target nucleic acids are NOT DETECTED. The SARS-CoV-2 RNA is generally detectable in upper respiratoy specimens during the acute phase of infection. The lowest concentration of SARS-CoV-2 viral copies this assay can detect is 131 copies/mL. A negative result does not preclude SARS-Cov-2 infection and should not be used as the sole basis for treatment or other patient management decisions. A negative result may occur with  improper specimen collection/handling, submission of specimen other than nasopharyngeal swab, presence of viral mutation(s) within the areas targeted by this assay, and inadequate number of viral copies (<131 copies/mL). A negative result must be combined with clinical observations, patient history, and epidemiological information. The expected result is Negative. Fact Sheet for Patients:  PinkCheek.be Fact Sheet for Healthcare Providers:   GravelBags.it This test is not yet ap proved or cleared by the [place] FDA and  has been authorized for detection and/or diagnosis of SARS-CoV-2 by FDA under an Emergency Use Authorization (EUA). This EUA will remain  in effect (meaning this test can be used) for the duration of the COVID-19 declaration under Section 564(b)(1) of the Act, 21 U.S.C. section 360bbb-3(b)(1), unless the authorization is terminated or revoked sooner.    Influenza A by PCR NEGATIVE NEGATIVE Final   Influenza B by PCR NEGATIVE NEGATIVE Final    Comment: (NOTE) The Xpert Xpress SARS-CoV-2/FLU/RSV assay is intended as an aid in  the diagnosis of influenza from Nasopharyngeal swab specimens and  should not be used as a sole basis for treatment. Nasal washings and  aspirates are unacceptable for Xpert Xpress SARS-CoV-2/FLU/RSV  testing. Fact Sheet for Patients: PinkCheek.be Fact Sheet for Healthcare Providers: GravelBags.it This test is not yet approved or cleared by the [place] FDA and  has been authorized for detection and/or diagnosis of SARS-CoV-2 by  FDA under an Emergency Use Authorization (EUA). This EUA will remain  in effect (meaning this test can be used) for the duration of the  Covid-19 declaration under Section 564(b)(1) of the Act, 21  U.S.C. section 360bbb-3(b)(1), unless the authorization is  terminated or revoked. Performed at [facility], [address] [place], [place] [address]      Labs: BNP (last 3 results) No results for input(s): BNP in the last 8760 hours. Basic Metabolic Panel: Recent Labs  Lab 09/18/19 0305 09/18/19 0305 09/19/19 0939 09/20/19 0337 09/22/19 0529 09/23/19 0453 09/24/19 0555  NA 141  --  140 142 137  --   --   K 4.0  --  4.7 3.5 3.2* 3.6  --  CL 99  --  101 100 97*  --   --   CO2 24  --  21* 24 26  --   --   GLUCOSE 99  --  88 124* 131*  --    --   BUN 98*  --  47* 61* 51*  --   --   CREATININE 10.04*  --  6.49* 7.91* 7.64*  --   --   CALCIUM 8.6*  --  8.5* 8.7* 8.3*  --   --   MG 2.6*   < > 2.2 2.1 2.0 2.1 2.1  PHOS 7.4*   < > 5.8* 7.4* 6.6* 4.3 4.6   < > = values in this interval not displayed.   Liver Function Tests: No results for input(s): AST, ALT, ALKPHOS, BILITOT, PROT, ALBUMIN in the last 168 hours. No results for input(s): LIPASE, AMYLASE in the last 168 hours. No results for input(s): AMMONIA in the last 168 hours. CBC: Recent Labs  Lab 09/18/19 0305 09/19/19 0939 09/20/19 0337 09/22/19 0535  WBC 12.8* 10.7* 12.2* 13.2*  HGB 8.6* 10.1* 8.9* 8.1*  HCT 29.1* 34.4* 29.8* 26.9*  MCV 102.5* 103.3* 101.4* 101.5*  PLT 527* 429* 479* 349   Cardiac Enzymes: No results for input(s): CKTOTAL, CKMB, CKMBINDEX, TROPONINI in the last 168 hours. BNP: Invalid input(s): POCBNP CBG: Recent Labs  Lab 09/23/19 1638 09/23/19 2048 09/24/19 0018 09/24/19 0745 09/24/19 1144  GLUCAP 117* 107* 131* 132* 115*   D-Dimer No results for input(s): DDIMER in the last 72 hours. Hgb A1c No results for input(s): HGBA1C in the last 72 hours. Lipid Profile No results for input(s): CHOL, HDL, LDLCALC, TRIG, CHOLHDL, LDLDIRECT in the last 72 hours. Thyroid function studies No results for input(s): TSH, T4TOTAL, T3FREE, THYROIDAB in the last 72 hours.  Invalid input(s): FREET3 Anemia work up No results for input(s): VITAMINB12, FOLATE, FERRITIN, TIBC, IRON, RETICCTPCT in the last 72 hours. Urinalysis    Component Value Date/Time   COLORURINE YELLOW (A) 01/30/2018 2055   APPEARANCEUR Cloudy (A) 02/28/2019 0902   LABSPEC 1.015 01/30/2018 2055   LABSPEC 1.012 08/02/2012 1114   PHURINE 7.0 01/30/2018 2055   GLUCOSEU Negative 02/28/2019 0902   GLUCOSEU Negative 08/02/2012 1114   HGBUR LARGE (A) 01/30/2018 2055   BILIRUBINUR Negative 02/28/2019 0902   BILIRUBINUR Negative 08/02/2012 1114   KETONESUR NEGATIVE 01/30/2018 2055    PROTEINUR 2+ (A) 02/28/2019 0902   PROTEINUR 100 (A) 01/30/2018 2055   NITRITE Negative 02/28/2019 0902   NITRITE NEGATIVE 01/30/2018 2055   LEUKOCYTESUR Trace (A) 02/28/2019 0902   LEUKOCYTESUR Negative 08/02/2012 1114   Sepsis Labs Invalid input(s): PROCALCITONIN,  WBC,  LACTICIDVEN Microbiology Recent Results (from the past 240 hour(s))  CULTURE, BLOOD (ROUTINE X 2) w Reflex to ID Panel     Status: None (Preliminary result)   Collection Time: 09/22/19  2:00 PM   Specimen: BLOOD  Result Value Ref Range Status   Specimen Description BLOOD BLOOD RIGHT HAND  Final   Special Requests   Final    BOTTLES DRAWN AEROBIC AND ANAEROBIC Blood Culture adequate volume   Culture   Final    NO GROWTH 2 DAYS Performed at [facility], [address] [place], [place] [address]    Report Status PENDING  Incomplete  CULTURE, BLOOD (ROUTINE X 2) w Reflex to ID Panel     Status: None (Preliminary result)   Collection Time: 09/22/19  3:44 PM   Specimen: BLOOD  Result Value  Ref Range Status   Specimen Description BLOOD BLOOD RIGHT HAND  Final   Special Requests   Final    BOTTLES DRAWN AEROBIC ONLY Blood Culture results may not be optimal due to an inadequate volume of blood received in culture bottles   Culture   Final    NO GROWTH 2 DAYS Performed at [facility], [address], [place], [place] [address]    Report Status PENDING  Incomplete  Respiratory Panel by RT PCR (Flu A&B, Covid) - Nasopharyngeal Swab     Status: None   Collection Time: 09/23/19  2:06 PM   Specimen: Nasopharyngeal Swab  Result Value Ref Range Status   SARS Coronavirus 2 by RT PCR NEGATIVE NEGATIVE Final    Comment: (NOTE) SARS-CoV-2 target nucleic acids are NOT DETECTED. The SARS-CoV-2 RNA is generally detectable in upper respiratoy specimens during the acute phase of infection. The lowest concentration of SARS-CoV-2 viral copies this assay can detect is 131 copies/mL. A negative result does not  preclude SARS-Cov-2 infection and should not be used as the sole basis for treatment or other patient management decisions. A negative result may occur with  improper specimen collection/handling, submission of specimen other than nasopharyngeal swab, presence of viral mutation(s) within the areas targeted by this assay, and inadequate number of viral copies (<131 copies/mL). A negative result must be combined with clinical observations, patient history, and epidemiological information. The expected result is Negative. Fact Sheet for Patients:  PinkCheek.be Fact Sheet for Healthcare Providers:  GravelBags.it This test is not yet ap proved or cleared by the [place] FDA and  has been authorized for detection and/or diagnosis of SARS-CoV-2 by FDA under an Emergency Use Authorization (EUA). This EUA will remain  in effect (meaning this test can be used) for the duration of the COVID-19 declaration under Section 564(b)(1) of the Act, 21 U.S.C. section 360bbb-3(b)(1), unless the authorization is terminated or revoked sooner.    Influenza A by PCR NEGATIVE NEGATIVE Final   Influenza B by PCR NEGATIVE NEGATIVE Final    Comment: (NOTE) The Xpert Xpress SARS-CoV-2/FLU/RSV assay is intended as an aid in  the diagnosis of influenza from Nasopharyngeal swab specimens and  should not be used as a sole basis for treatment. Nasal washings and  aspirates are unacceptable for Xpert Xpress SARS-CoV-2/FLU/RSV  testing. Fact Sheet for Patients: PinkCheek.be Fact Sheet for Healthcare Providers: GravelBags.it This test is not yet approved or cleared by the [place] FDA and  has been authorized for detection and/or diagnosis of SARS-CoV-2 by  FDA under an Emergency Use Authorization (EUA). This EUA will remain  in effect (meaning this test can be used) for the duration of the   Covid-19 declaration under Section 564(b)(1) of the Act, 21  U.S.C. section 360bbb-3(b)(1), unless the authorization is  terminated or revoked. Performed at [facility], [address] [place], [place] [address]    Dysphagia: S/p PEG tube placement on 09/20/19 Nutrition following  Aspiration pneumonia:  Initially treated with IVcefazolin, later on transition to IV Unasyn. The patienthad recurrentepisode of aspiration on of 09/13/2019 with fever, wastransitionedback to Zosyn. ContinueZosyn until3/2- completed.  Fever- last night 3/4. Wbc mildly up. Will ck cxr, ua, bcx Has to be afebrile 24hrs prior to dc    Acute toxic metabolic encephalopathy: Improving-Waxes and wanes, usually worse after dialysis.Chart review shows that patient has had similar episodes of encephalopathyin the past and requiredseveral days to recover. --History of seizures-Continue Vimpat.  --Continue seizure precaution. --  Appreciate neurology assistance. EEG unremarkable. --Avoidcephalosporins.  Acute hypoxemic and hypercapnic respiratory failure: Resolved.  Extubated on 09/08/2019.  She iscurrently on room air. Patient was briefly on NRB while she was at HD on 09/13/2019 but that is likely from coiling of the NG tube.  Status postelectiveleft total hip arthroplasty Performed on September 05, 2019 by Dr.Poggi  ESRD on hemodialysis: Monday Wednesday Friday Follow-up with nephrologist for hemodialysis. Per neurology uremia can also contribute to patient's encephalopathy. Per nephrology,Prior to discharge, patient needs to be able to sit in the chair-which she did yesterday   Chronic diastolic CHF:  Hemodialysis for fluid management.  2D echo on 09/07/2019 showed EF estimated at 60 to 58%, grade 1 diastolic dysfunction. Euvolemic on exam  Leukocytosis and thrombocytopenia: Stable.  Bradycardia: Improved  Suspected ileus:Resolved  MRSA  colonization of nares: Completed Bactroban  Time coordinating discharge: Over 30 minutes  SIGNED:   Nolberto Hanlon, MD  [facility] 09/24/2019, 12:28 PM Pager   If 7PM-7AM, please contact night-coverage [email or web address] Password [email or web address]

## 2019-09-24 NOTE — Progress Notes (Signed)
EMS present for pt discharge; discharge packet given to EMS personnel to take to [facility]; pt transferred from the hosptial bed to the EMS stretcher; personal bags given to EMS to take to the facility.

## 2019-09-27 ENCOUNTER — Inpatient Hospital Stay
Admission: EM | Admit: 2019-09-27 | Discharge: 2019-10-10 | DRG: 377 | Disposition: A | Discharge status: Skilled Nursing Facility | Payer: Medicare Other | Attending: Internal Medicine | Admitting: Internal Medicine

## 2019-09-27 ENCOUNTER — Other Ambulatory Visit: Payer: Self-pay

## 2019-09-27 DIAGNOSIS — R197 Diarrhea, unspecified: Secondary | ICD-10-CM | POA: Diagnosis present

## 2019-09-27 DIAGNOSIS — I358 Other nonrheumatic aortic valve disorders: Secondary | ICD-10-CM | POA: Diagnosis present

## 2019-09-27 DIAGNOSIS — R569 Unspecified convulsions: Secondary | ICD-10-CM

## 2019-09-27 DIAGNOSIS — Z79899 Other long term (current) drug therapy: Secondary | ICD-10-CM

## 2019-09-27 DIAGNOSIS — J441 Chronic obstructive pulmonary disease with (acute) exacerbation: Secondary | ICD-10-CM | POA: Diagnosis not present

## 2019-09-27 DIAGNOSIS — M79605 Pain in left leg: Secondary | ICD-10-CM

## 2019-09-27 DIAGNOSIS — K900 Celiac disease: Secondary | ICD-10-CM | POA: Diagnosis present

## 2019-09-27 DIAGNOSIS — K254 Chronic or unspecified gastric ulcer with hemorrhage: Secondary | ICD-10-CM | POA: Diagnosis not present

## 2019-09-27 DIAGNOSIS — G40909 Epilepsy, unspecified, not intractable, without status epilepticus: Secondary | ICD-10-CM | POA: Diagnosis present

## 2019-09-27 DIAGNOSIS — Z992 Dependence on renal dialysis: Secondary | ICD-10-CM

## 2019-09-27 DIAGNOSIS — N186 End stage renal disease: Secondary | ICD-10-CM | POA: Diagnosis present

## 2019-09-27 DIAGNOSIS — R52 Pain, unspecified: Secondary | ICD-10-CM

## 2019-09-27 DIAGNOSIS — E1129 Type 2 diabetes mellitus with other diabetic kidney complication: Secondary | ICD-10-CM | POA: Diagnosis present

## 2019-09-27 DIAGNOSIS — Z833 Family history of diabetes mellitus: Secondary | ICD-10-CM

## 2019-09-27 DIAGNOSIS — M1990 Unspecified osteoarthritis, unspecified site: Secondary | ICD-10-CM | POA: Diagnosis present

## 2019-09-27 DIAGNOSIS — I132 Hypertensive heart and chronic kidney disease with heart failure and with stage 5 chronic kidney disease, or end stage renal disease: Secondary | ICD-10-CM | POA: Diagnosis present

## 2019-09-27 DIAGNOSIS — Z452 Encounter for adjustment and management of vascular access device: Secondary | ICD-10-CM

## 2019-09-27 DIAGNOSIS — G9341 Metabolic encephalopathy: Secondary | ICD-10-CM | POA: Diagnosis not present

## 2019-09-27 DIAGNOSIS — J440 Chronic obstructive pulmonary disease with acute lower respiratory infection: Secondary | ICD-10-CM | POA: Diagnosis present

## 2019-09-27 DIAGNOSIS — N2581 Secondary hyperparathyroidism of renal origin: Secondary | ICD-10-CM | POA: Diagnosis present

## 2019-09-27 DIAGNOSIS — Z888 Allergy status to other drugs, medicaments and biological substances status: Secondary | ICD-10-CM

## 2019-09-27 DIAGNOSIS — Z923 Personal history of irradiation: Secondary | ICD-10-CM

## 2019-09-27 DIAGNOSIS — J208 Acute bronchitis due to other specified organisms: Secondary | ICD-10-CM | POA: Diagnosis present

## 2019-09-27 DIAGNOSIS — K219 Gastro-esophageal reflux disease without esophagitis: Secondary | ICD-10-CM | POA: Diagnosis present

## 2019-09-27 DIAGNOSIS — E669 Obesity, unspecified: Secondary | ICD-10-CM | POA: Diagnosis present

## 2019-09-27 DIAGNOSIS — D72829 Elevated white blood cell count, unspecified: Secondary | ICD-10-CM | POA: Diagnosis present

## 2019-09-27 DIAGNOSIS — I2510 Atherosclerotic heart disease of native coronary artery without angina pectoris: Secondary | ICD-10-CM | POA: Diagnosis present

## 2019-09-27 DIAGNOSIS — Z9981 Dependence on supplemental oxygen: Secondary | ICD-10-CM

## 2019-09-27 DIAGNOSIS — Z6830 Body mass index (BMI) 30.0-30.9, adult: Secondary | ICD-10-CM

## 2019-09-27 DIAGNOSIS — R633 Feeding difficulties: Secondary | ICD-10-CM | POA: Diagnosis present

## 2019-09-27 DIAGNOSIS — Z96642 Presence of left artificial hip joint: Secondary | ICD-10-CM | POA: Diagnosis present

## 2019-09-27 DIAGNOSIS — J9601 Acute respiratory failure with hypoxia: Secondary | ICD-10-CM

## 2019-09-27 DIAGNOSIS — E1142 Type 2 diabetes mellitus with diabetic polyneuropathy: Secondary | ICD-10-CM | POA: Diagnosis present

## 2019-09-27 DIAGNOSIS — L0390 Cellulitis, unspecified: Secondary | ICD-10-CM | POA: Diagnosis present

## 2019-09-27 DIAGNOSIS — R1310 Dysphagia, unspecified: Secondary | ICD-10-CM | POA: Diagnosis present

## 2019-09-27 DIAGNOSIS — R0602 Shortness of breath: Secondary | ICD-10-CM

## 2019-09-27 DIAGNOSIS — K920 Hematemesis: Secondary | ICD-10-CM | POA: Diagnosis not present

## 2019-09-27 DIAGNOSIS — D631 Anemia in chronic kidney disease: Secondary | ICD-10-CM | POA: Diagnosis present

## 2019-09-27 DIAGNOSIS — R252 Cramp and spasm: Secondary | ICD-10-CM | POA: Diagnosis present

## 2019-09-27 DIAGNOSIS — M79604 Pain in right leg: Secondary | ICD-10-CM

## 2019-09-27 DIAGNOSIS — E1122 Type 2 diabetes mellitus with diabetic chronic kidney disease: Secondary | ICD-10-CM | POA: Diagnosis present

## 2019-09-27 DIAGNOSIS — K922 Gastrointestinal hemorrhage, unspecified: Secondary | ICD-10-CM | POA: Diagnosis present

## 2019-09-27 DIAGNOSIS — G4733 Obstructive sleep apnea (adult) (pediatric): Secondary | ICD-10-CM | POA: Diagnosis present

## 2019-09-27 DIAGNOSIS — L89151 Pressure ulcer of sacral region, stage 1: Secondary | ICD-10-CM | POA: Diagnosis present

## 2019-09-27 DIAGNOSIS — Z9221 Personal history of antineoplastic chemotherapy: Secondary | ICD-10-CM

## 2019-09-27 DIAGNOSIS — Z885 Allergy status to narcotic agent status: Secondary | ICD-10-CM

## 2019-09-27 DIAGNOSIS — K9423 Gastrostomy malfunction: Secondary | ICD-10-CM

## 2019-09-27 DIAGNOSIS — I5043 Acute on chronic combined systolic (congestive) and diastolic (congestive) heart failure: Secondary | ICD-10-CM | POA: Diagnosis present

## 2019-09-27 DIAGNOSIS — L89312 Pressure ulcer of right buttock, stage 2: Secondary | ICD-10-CM | POA: Diagnosis present

## 2019-09-27 DIAGNOSIS — Z809 Family history of malignant neoplasm, unspecified: Secondary | ICD-10-CM

## 2019-09-27 DIAGNOSIS — Z87891 Personal history of nicotine dependence: Secondary | ICD-10-CM

## 2019-09-27 DIAGNOSIS — Z8551 Personal history of malignant neoplasm of bladder: Secondary | ICD-10-CM

## 2019-09-27 DIAGNOSIS — D62 Acute posthemorrhagic anemia: Secondary | ICD-10-CM | POA: Diagnosis present

## 2019-09-27 DIAGNOSIS — T82594A Other mechanical complication of infusion catheter, initial encounter: Secondary | ICD-10-CM

## 2019-09-27 DIAGNOSIS — L089 Local infection of the skin and subcutaneous tissue, unspecified: Secondary | ICD-10-CM | POA: Diagnosis present

## 2019-09-27 DIAGNOSIS — E1140 Type 2 diabetes mellitus with diabetic neuropathy, unspecified: Secondary | ICD-10-CM | POA: Diagnosis present

## 2019-09-27 DIAGNOSIS — J9621 Acute and chronic respiratory failure with hypoxia: Secondary | ICD-10-CM | POA: Diagnosis present

## 2019-09-27 DIAGNOSIS — Z90710 Acquired absence of both cervix and uterus: Secondary | ICD-10-CM

## 2019-09-27 DIAGNOSIS — I5032 Chronic diastolic (congestive) heart failure: Secondary | ICD-10-CM

## 2019-09-27 DIAGNOSIS — L98429 Non-pressure chronic ulcer of back with unspecified severity: Secondary | ICD-10-CM

## 2019-09-27 DIAGNOSIS — Z20822 Contact with and (suspected) exposure to covid-19: Secondary | ICD-10-CM | POA: Diagnosis present

## 2019-09-27 DIAGNOSIS — E785 Hyperlipidemia, unspecified: Secondary | ICD-10-CM | POA: Diagnosis present

## 2019-09-27 LAB — CBC
HCT: 16.5 % — ABNORMAL LOW (ref 36.0–46.0)
Hemoglobin: 4.8 g/dL — CL (ref 12.0–15.0)
MCH: 29.6 pg (ref 26.0–34.0)
MCHC: 29.1 g/dL — ABNORMAL LOW (ref 30.0–36.0)
MCV: 101.9 fL — ABNORMAL HIGH (ref 80.0–100.0)
Platelets: 246 10*3/uL (ref 150–400)
RBC: 1.62 MIL/uL — ABNORMAL LOW (ref 3.87–5.11)
RDW: 14.4 % (ref 11.5–15.5)
WBC: 19.7 10*3/uL — ABNORMAL HIGH (ref 4.0–10.5)
nRBC: 0 % (ref 0.0–0.2)

## 2019-09-27 LAB — COMPREHENSIVE METABOLIC PANEL
ALT: 17 U/L (ref 0–44)
AST: 36 U/L (ref 15–41)
Albumin: 2.5 g/dL — ABNORMAL LOW (ref 3.5–5.0)
Alkaline Phosphatase: 56 U/L (ref 38–126)
Anion gap: 10 (ref 5–15)
BUN: 39 mg/dL — ABNORMAL HIGH (ref 8–23)
CO2: 27 mmol/L (ref 22–32)
Calcium: 8.5 mg/dL — ABNORMAL LOW (ref 8.9–10.3)
Chloride: 96 mmol/L — ABNORMAL LOW (ref 98–111)
Creatinine, Ser: 4.37 mg/dL — ABNORMAL HIGH (ref 0.44–1.00)
GFR calc Af Amer: 10 mL/min — ABNORMAL LOW (ref >60)
GFR calc non Af Amer: 9 mL/min — ABNORMAL LOW (ref >60)
Glucose, Bld: 121 mg/dL — ABNORMAL HIGH (ref 70–99)
Potassium: 3.5 mmol/L (ref 3.5–5.1)
Sodium: 133 mmol/L — ABNORMAL LOW (ref 135–145)
Total Bilirubin: 0.3 mg/dL (ref 0.3–1.2)
Total Protein: 7.5 g/dL (ref 6.5–8.1)

## 2019-09-27 LAB — LIPASE, BLOOD: Lipase: 63 U/L — ABNORMAL HIGH (ref 11–51)

## 2019-09-27 LAB — CULTURE, BLOOD (ROUTINE X 2)
Culture: NO GROWTH
Culture: NO GROWTH
Special Requests: ADEQUATE

## 2019-09-27 LAB — PREPARE RBC (CROSSMATCH)

## 2019-09-27 LAB — PROTIME-INR
INR: 1.2 (ref 0.8–1.2)
Prothrombin Time: 15.1 seconds (ref 11.4–15.2)

## 2019-09-27 LAB — APTT: aPTT: 35 seconds (ref 24–36)

## 2019-09-27 MED ORDER — SODIUM CHLORIDE 0.9 % IV SOLN
80.0000 mg | Freq: Once | INTRAVENOUS | Status: AC
  Administered 2019-09-28: 80 mg via INTRAVENOUS
  Filled 2019-09-27 (×2): qty 80

## 2019-09-27 MED ORDER — SODIUM CHLORIDE 0.9 % IV SOLN
8.0000 mg/h | INTRAVENOUS | Status: DC
  Administered 2019-09-28 (×2): 8 mg/h via INTRAVENOUS
  Filled 2019-09-27 (×2): qty 80

## 2019-09-27 MED ORDER — PANTOPRAZOLE SODIUM 40 MG IV SOLR: 40.0000 mg | Freq: Two times a day (BID) | INTRAVENOUS | Status: DC

## 2019-09-27 MED ORDER — SODIUM CHLORIDE 0.9 % IV SOLN: 10.0000 mL/h | Freq: Once | INTRAVENOUS | Status: DC

## 2019-09-27 NOTE — ED Notes (Signed)
This RN called lab to draw needed blood work. Lab informed this RN that both phlebotomist were on the floor and it may be a little while.

## 2019-09-27 NOTE — ED Notes (Addendum)
IV team still at bedside obtaining labs.

## 2019-09-27 NOTE — ED Notes (Signed)
IV team at bedside 

## 2019-09-27 NOTE — ED Triage Notes (Signed)
Pt to ED via [facility] from [facility]. Per EMS pt has had coffee ground emesis x2days. Per EMS pt had a g-tube that she pulled out but foley is still in place. Per EMS facility states HGB of 5. Pt also on 2L [place] chronically.  Upon arrival pt in NAD and alert but is slow to respond to questions. Pt states she does not remember throwing up. Pt also presents with sacral wound.

## 2019-09-27 NOTE — ED Notes (Signed)
This RN attempted blood draw x2 to obtain labs. This RN unsuccessful. Amy, RN attempted lab draws with no success. IV team consult placed STAT for IV and lab draw. MD made aware.

## 2019-09-27 NOTE — ED Notes (Signed)
IV team unable to obtain IV access. MD made aware.

## 2019-09-27 NOTE — ED Notes (Signed)
Date and time results received: 09/27/19 10:30 PM  (use smartphrase ".now" to insert current time)  Test: HGB Critical Value: 4.8  Name of Provider Notified: Dr. Archie Balboa  Orders Received? Or Actions Taken?: no new orders at this time

## 2019-09-28 ENCOUNTER — Encounter: Payer: Self-pay | Admitting: Family Medicine

## 2019-09-28 ENCOUNTER — Inpatient Hospital Stay: Payer: Medicare Other | Admitting: Anesthesiology

## 2019-09-28 ENCOUNTER — Inpatient Hospital Stay: Payer: Medicare Other

## 2019-09-28 ENCOUNTER — Encounter
Admission: EM | Disposition: A | Discharge status: Skilled Nursing Facility | Payer: Self-pay | Source: Home / Self Care | Attending: Internal Medicine

## 2019-09-28 DIAGNOSIS — G9341 Metabolic encephalopathy: Secondary | ICD-10-CM | POA: Diagnosis not present

## 2019-09-28 DIAGNOSIS — K922 Gastrointestinal hemorrhage, unspecified: Secondary | ICD-10-CM | POA: Diagnosis present

## 2019-09-28 DIAGNOSIS — N186 End stage renal disease: Secondary | ICD-10-CM | POA: Diagnosis present

## 2019-09-28 DIAGNOSIS — R569 Unspecified convulsions: Secondary | ICD-10-CM

## 2019-09-28 DIAGNOSIS — L0390 Cellulitis, unspecified: Secondary | ICD-10-CM | POA: Diagnosis present

## 2019-09-28 DIAGNOSIS — R197 Diarrhea, unspecified: Secondary | ICD-10-CM | POA: Diagnosis present

## 2019-09-28 DIAGNOSIS — D62 Acute posthemorrhagic anemia: Secondary | ICD-10-CM | POA: Diagnosis present

## 2019-09-28 DIAGNOSIS — J208 Acute bronchitis due to other specified organisms: Secondary | ICD-10-CM | POA: Diagnosis present

## 2019-09-28 DIAGNOSIS — J441 Chronic obstructive pulmonary disease with (acute) exacerbation: Secondary | ICD-10-CM | POA: Diagnosis not present

## 2019-09-28 DIAGNOSIS — R4182 Altered mental status, unspecified: Secondary | ICD-10-CM | POA: Diagnosis not present

## 2019-09-28 DIAGNOSIS — I5032 Chronic diastolic (congestive) heart failure: Secondary | ICD-10-CM

## 2019-09-28 DIAGNOSIS — I5043 Acute on chronic combined systolic (congestive) and diastolic (congestive) heart failure: Secondary | ICD-10-CM | POA: Diagnosis present

## 2019-09-28 DIAGNOSIS — E1140 Type 2 diabetes mellitus with diabetic neuropathy, unspecified: Secondary | ICD-10-CM | POA: Diagnosis present

## 2019-09-28 DIAGNOSIS — E1122 Type 2 diabetes mellitus with diabetic chronic kidney disease: Secondary | ICD-10-CM | POA: Diagnosis present

## 2019-09-28 DIAGNOSIS — D72829 Elevated white blood cell count, unspecified: Secondary | ICD-10-CM | POA: Diagnosis not present

## 2019-09-28 DIAGNOSIS — L89151 Pressure ulcer of sacral region, stage 1: Secondary | ICD-10-CM | POA: Diagnosis present

## 2019-09-28 DIAGNOSIS — Z20822 Contact with and (suspected) exposure to covid-19: Secondary | ICD-10-CM | POA: Diagnosis present

## 2019-09-28 DIAGNOSIS — K900 Celiac disease: Secondary | ICD-10-CM | POA: Diagnosis present

## 2019-09-28 DIAGNOSIS — I132 Hypertensive heart and chronic kidney disease with heart failure and with stage 5 chronic kidney disease, or end stage renal disease: Secondary | ICD-10-CM | POA: Diagnosis present

## 2019-09-28 DIAGNOSIS — K9423 Gastrostomy malfunction: Secondary | ICD-10-CM | POA: Diagnosis present

## 2019-09-28 DIAGNOSIS — L98421 Non-pressure chronic ulcer of back limited to breakdown of skin: Secondary | ICD-10-CM

## 2019-09-28 DIAGNOSIS — J9601 Acute respiratory failure with hypoxia: Secondary | ICD-10-CM

## 2019-09-28 DIAGNOSIS — R1310 Dysphagia, unspecified: Secondary | ICD-10-CM | POA: Diagnosis present

## 2019-09-28 DIAGNOSIS — K254 Chronic or unspecified gastric ulcer with hemorrhage: Secondary | ICD-10-CM | POA: Diagnosis present

## 2019-09-28 DIAGNOSIS — K920 Hematemesis: Secondary | ICD-10-CM | POA: Diagnosis present

## 2019-09-28 DIAGNOSIS — D631 Anemia in chronic kidney disease: Secondary | ICD-10-CM | POA: Diagnosis present

## 2019-09-28 DIAGNOSIS — J440 Chronic obstructive pulmonary disease with acute lower respiratory infection: Secondary | ICD-10-CM | POA: Diagnosis present

## 2019-09-28 DIAGNOSIS — N2581 Secondary hyperparathyroidism of renal origin: Secondary | ICD-10-CM | POA: Diagnosis present

## 2019-09-28 DIAGNOSIS — L89312 Pressure ulcer of right buttock, stage 2: Secondary | ICD-10-CM | POA: Diagnosis present

## 2019-09-28 DIAGNOSIS — E1142 Type 2 diabetes mellitus with diabetic polyneuropathy: Secondary | ICD-10-CM | POA: Diagnosis present

## 2019-09-28 DIAGNOSIS — J9621 Acute and chronic respiratory failure with hypoxia: Secondary | ICD-10-CM | POA: Diagnosis present

## 2019-09-28 DIAGNOSIS — R252 Cramp and spasm: Secondary | ICD-10-CM | POA: Diagnosis present

## 2019-09-28 DIAGNOSIS — E1121 Type 2 diabetes mellitus with diabetic nephropathy: Secondary | ICD-10-CM

## 2019-09-28 HISTORY — PX: ESOPHAGOGASTRODUODENOSCOPY (EGD) WITH PROPOFOL: SHX5813

## 2019-09-28 LAB — CBC
HCT: 19.9 % — ABNORMAL LOW (ref 36.0–46.0)
HCT: 23.1 % — ABNORMAL LOW (ref 36.0–46.0)
Hemoglobin: 6.1 g/dL — ABNORMAL LOW (ref 12.0–15.0)
Hemoglobin: 7.5 g/dL — ABNORMAL LOW (ref 12.0–15.0)
MCH: 29.5 pg (ref 26.0–34.0)
MCH: 28.6 pg (ref 26.0–34.0)
MCHC: 30.7 g/dL (ref 30.0–36.0)
MCHC: 32.5 g/dL (ref 30.0–36.0)
MCV: 96.1 fL (ref 80.0–100.0)
MCV: 88.2 fL (ref 80.0–100.0)
Platelets: 239 10*3/uL (ref 150–400)
Platelets: 236 10*3/uL (ref 150–400)
RBC: 2.07 MIL/uL — ABNORMAL LOW (ref 3.87–5.11)
RBC: 2.62 MIL/uL — ABNORMAL LOW (ref 3.87–5.11)
RDW: 16.9 % — ABNORMAL HIGH (ref 11.5–15.5)
RDW: 19.6 % — ABNORMAL HIGH (ref 11.5–15.5)
WBC: 15.2 10*3/uL — ABNORMAL HIGH (ref 4.0–10.5)
WBC: 16.8 10*3/uL — ABNORMAL HIGH (ref 4.0–10.5)
nRBC: 0 % (ref 0.0–0.2)
nRBC: 0 % (ref 0.0–0.2)

## 2019-09-28 LAB — BASIC METABOLIC PANEL
Anion gap: 9 (ref 5–15)
BUN: 43 mg/dL — ABNORMAL HIGH (ref 8–23)
CO2: 27 mmol/L (ref 22–32)
Calcium: 8.4 mg/dL — ABNORMAL LOW (ref 8.9–10.3)
Chloride: 97 mmol/L — ABNORMAL LOW (ref 98–111)
Creatinine, Ser: 4.99 mg/dL — ABNORMAL HIGH (ref 0.44–1.00)
GFR calc Af Amer: 9 mL/min — ABNORMAL LOW (ref >60)
GFR calc non Af Amer: 8 mL/min — ABNORMAL LOW (ref >60)
Glucose, Bld: 99 mg/dL (ref 70–99)
Potassium: 3.5 mmol/L (ref 3.5–5.1)
Sodium: 133 mmol/L — ABNORMAL LOW (ref 135–145)

## 2019-09-28 LAB — GLUCOSE, CAPILLARY
Glucose-Capillary: 100 mg/dL — ABNORMAL HIGH (ref 70–99)
Glucose-Capillary: 96 mg/dL (ref 70–99)

## 2019-09-28 LAB — SARS CORONAVIRUS 2 (TAT 6-24 HRS): SARS Coronavirus 2: NEGATIVE

## 2019-09-28 LAB — MRSA PCR SCREENING: MRSA by PCR: NEGATIVE

## 2019-09-28 SURGERY — ESOPHAGOGASTRODUODENOSCOPY (EGD) WITH PROPOFOL
Anesthesia: General

## 2019-09-28 MED ORDER — LIDOCAINE HCL (PF) 2 % IJ SOLN
INTRAMUSCULAR | Status: AC
  Filled 2019-09-28: qty 5

## 2019-09-28 MED ORDER — SODIUM CHLORIDE 0.9 % IV SOLN
100.0000 mg | Freq: Two times a day (BID) | INTRAVENOUS | Status: DC
  Administered 2019-09-28 (×2): 100 mg via INTRAVENOUS
  Filled 2019-09-28 (×5): qty 10

## 2019-09-28 MED ORDER — PANTOPRAZOLE SODIUM 40 MG IV SOLR
40.0000 mg | Freq: Two times a day (BID) | INTRAVENOUS | Status: DC
  Administered 2019-09-28 – 2019-10-02 (×8): 40 mg via INTRAVENOUS
  Filled 2019-09-28 (×8): qty 40

## 2019-09-28 MED ORDER — EPHEDRINE SULFATE 50 MG/ML IJ SOLN
INTRAMUSCULAR | Status: DC | PRN
  Administered 2019-09-28 (×2): 10 mg via INTRAVENOUS

## 2019-09-28 MED ORDER — PROPOFOL 10 MG/ML IV BOLUS
INTRAVENOUS | Status: DC | PRN
  Administered 2019-09-28: 120 mg via INTRAVENOUS

## 2019-09-28 MED ORDER — DEXAMETHASONE SODIUM PHOSPHATE 4 MG/ML IJ SOLN
INTRAMUSCULAR | Status: AC
  Filled 2019-09-28: qty 1

## 2019-09-28 MED ORDER — SUCCINYLCHOLINE CHLORIDE 200 MG/10ML IV SOSY
PREFILLED_SYRINGE | INTRAVENOUS | Status: AC
  Filled 2019-09-28: qty 10

## 2019-09-28 MED ORDER — PRO-STAT SUGAR FREE PO LIQD
30.0000 mL | Freq: Two times a day (BID) | ORAL | Status: DC
  Administered 2019-09-28 – 2019-10-09 (×22): 30 mL

## 2019-09-28 MED ORDER — ONDANSETRON HCL 4 MG/2ML IJ SOLN: 4.0000 mg | Freq: Once | INTRAMUSCULAR | Status: DC | PRN

## 2019-09-28 MED ORDER — LIDOCAINE HCL (CARDIAC) PF 100 MG/5ML IV SOSY
PREFILLED_SYRINGE | INTRAVENOUS | Status: DC | PRN
  Administered 2019-09-28: 60 mg via INTRAVENOUS

## 2019-09-28 MED ORDER — ONDANSETRON HCL 4 MG/2ML IJ SOLN
INTRAMUSCULAR | Status: DC | PRN
  Administered 2019-09-28: 4 mg via INTRAVENOUS

## 2019-09-28 MED ORDER — SEVOFLURANE IN SOLN
RESPIRATORY_TRACT | Status: AC
  Filled 2019-09-28: qty 250

## 2019-09-28 MED ORDER — SODIUM CHLORIDE 0.9 % IV SOLN: INTRAVENOUS | Status: DC

## 2019-09-28 MED ORDER — FENTANYL CITRATE (PF) 100 MCG/2ML IJ SOLN: 25.0000 ug | INTRAMUSCULAR | Status: DC | PRN

## 2019-09-28 MED ORDER — SUCCINYLCHOLINE CHLORIDE 20 MG/ML IJ SOLN
INTRAMUSCULAR | Status: DC | PRN
  Administered 2019-09-28: 100 mg via INTRAVENOUS

## 2019-09-28 MED ORDER — GLYCOPYRROLATE 0.2 MG/ML IJ SOLN
INTRAMUSCULAR | Status: AC
  Filled 2019-09-28: qty 1

## 2019-09-28 MED ORDER — ROCURONIUM BROMIDE 10 MG/ML (PF) SYRINGE
PREFILLED_SYRINGE | INTRAVENOUS | Status: AC
  Filled 2019-09-28: qty 10

## 2019-09-28 MED ORDER — LACTATED RINGERS IV SOLN: INTRAVENOUS | Status: DC | PRN

## 2019-09-28 MED ORDER — ALBUTEROL SULFATE (2.5 MG/3ML) 0.083% IN NEBU
2.5000 mg | INHALATION_SOLUTION | Freq: Four times a day (QID) | RESPIRATORY_TRACT | Status: DC | PRN
  Administered 2019-10-02: 2.5 mg via RESPIRATORY_TRACT
  Filled 2019-09-28 (×2): qty 3

## 2019-09-28 MED ORDER — GABAPENTIN 300 MG PO CAPS
300.0000 mg | ORAL_CAPSULE | Freq: Three times a day (TID) | ORAL | Status: DC
  Administered 2019-09-28 – 2019-09-30 (×7): 300 mg
  Filled 2019-09-28 (×8): qty 1

## 2019-09-28 MED ORDER — LACOSAMIDE 50 MG PO TABS
100.0000 mg | ORAL_TABLET | Freq: Two times a day (BID) | ORAL | Status: DC
  Administered 2019-09-28 – 2019-10-09 (×22): 100 mg
  Filled 2019-09-28 (×22): qty 2

## 2019-09-28 MED ORDER — FENTANYL CITRATE (PF) 100 MCG/2ML IJ SOLN
INTRAMUSCULAR | Status: DC | PRN
  Administered 2019-09-28: 25 ug via INTRAVENOUS

## 2019-09-28 MED ORDER — DEXAMETHASONE SODIUM PHOSPHATE 10 MG/ML IJ SOLN
INTRAMUSCULAR | Status: DC | PRN
  Administered 2019-09-28: 4 mg via INTRAVENOUS

## 2019-09-28 MED ORDER — ONDANSETRON HCL 4 MG/2ML IJ SOLN
INTRAMUSCULAR | Status: AC
  Filled 2019-09-28: qty 2

## 2019-09-28 MED ORDER — PHENYLEPHRINE HCL (PRESSORS) 10 MG/ML IV SOLN
INTRAVENOUS | Status: DC | PRN
  Administered 2019-09-28: 200 ug via INTRAVENOUS
  Administered 2019-09-28: 100 ug via INTRAVENOUS
  Administered 2019-09-28: 200 ug via INTRAVENOUS

## 2019-09-28 MED ORDER — FENTANYL CITRATE (PF) 100 MCG/2ML IJ SOLN
INTRAMUSCULAR | Status: AC
  Filled 2019-09-28: qty 2

## 2019-09-28 MED ORDER — NEPRO/CARBSTEADY PO LIQD
1000.0000 mL | ORAL | Status: DC
  Administered 2019-09-28: 1000 mL

## 2019-09-28 MED ORDER — FREE WATER
50.0000 mL | Status: DC
  Administered 2019-09-29 (×3): 50 mL

## 2019-09-28 NOTE — Progress Notes (Signed)
[facility] OF CARE NOTE Patient: Barbara [name] VXB:[id number]   PCP: [name], MD DOB: 03/28/1941   DOA: 09/27/2019   DOS: 09/28/2019    Patient was admitted by my colleague Dr. Flossie Buffy earlier on 09/28/2019. I have reviewed the H&P as well as assessment and plan and agree with the same. Important changes in the plan are listed below.  Plan of care: Principal Problem:   Acute GI bleeding Active Problems:   Leukocytosis   ESRD (end stage renal disease) (HCC)   Type II diabetes mellitus with renal manifestations (HCC)   Sacral ulcer (HCC)   Acute respiratory failure with hypoxia (HCC)   Chronic diastolic CHF (congestive heart failure) ([place])   Seizure (HCC)   PEG tube malfunction ([place]) pt has a foley cathter instead of her G tube Maintain NPO and do not use G tube Gastroenterology and Nephrology consulted  1 PRBC given, 1 more pending Transfer to tele given stability and improvement in Hb without any loss.   Author: Berle Mull, MD [facility] 09/28/2019 9:19 AM   If 7PM-7AM, please contact night-coverage at [email or web address]

## 2019-09-28 NOTE — ED Notes (Signed)
Admitting MD at the bedside for pt evaluation.

## 2019-09-28 NOTE — H&P (View-Only) (Signed)
[facility] GI Inpatient Consult Note   Taytum Wheller Barbara [name], M.D.  Reason for Consult: GI bleeding   Attending Requesting Consult: Pranav Patel, MD   History of Present Illness: Barbara [name] is a 78 y.o. female presenting for coffee-ground emesis several hours after pulling out her gastrostomy tube.  GI service was called for consultation.   PEG tube was placed endoscopically on 09/20/2019 d/t feeding problem. Patient inadvertently reportedly pulled her PEG tube out yesterday, and presented with coffee ground emesis. She is currently alert in NAD. Non-conversive and confused. Patient husband is at bedside requesting replacement of PEG and assessment of GI bleeding.   Past Medical History:  Past Medical History:  Diagnosis Date  . Adult celiac disease   . Anemia associated with chronic renal failure 2017   blood transfusion last week 10/17  . Arthritis   . Asthma   . 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 Chronic kidney disease   . 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  dr. singh and dr. lateef follow her  . COPD (chronic obstructive pulmonary disease) (HCC)   . Coronary artery disease   . Diabetes mellitus without complication (HCC)   . Dialysis patient (HCC)    Tues, Thurs, Sat  . Dyspnea    with exertion  . Elevated lipids   . GERD (gastroesophageal reflux disease)   . Hematuria   . Hypertension   . Lower back pain   . Neuropathy   . Oxygen dependent    at hs  . Personal history of chemotherapy 2017   bladder ca  . Personal history of radiation therapy 2017   bladder ca  . Sleep apnea    uses cpap  . Urinary obstruction 01/2016    Problem List: Patient Active Problem List   Diagnosis Date Noted  . Acute GI bleeding 09/28/2019  . Acute respiratory failure with hypoxia (HCC) 09/28/2019  . Chronic diastolic CHF (congestive heart failure) (HCC) 09/28/2019  . Seizure (HCC) 09/28/2019  . PEG tube malfunction (HCC) 09/28/2019  . Sacral ulcer (HCC)  09/23/2019  . Dysphagia 09/19/2019  . Acute encephalopathy   . Goals of care, counseling/discussion   . Aspiration pneumonia (HCC) 09/11/2019  . Status post total hip replacement, left 09/05/2019  . Type II diabetes mellitus with renal manifestations (HCC) 09/05/2019  . ESRD on dialysis (HCC) 09/05/2019  . Bacteremia due to other bacteria   . Bacteroides fragilis infection   . Acute metabolic encephalopathy   . Somnolence   . Lobar pneumonia (HCC) 05/11/2019  . Dialysis patient (HCC)   . Primary osteoarthritis of left hip 04/07/2019  . Metabolic acidosis 05/10/2018  . Complication from renal dialysis device 01/28/2018  . History of colonic polyps 07/30/2017  . Presence of cardiac and vascular implant and graft   . Fever   . Sepsis (HCC)   . ESRD (end stage renal disease) (HCC) 02/01/2017  . Sepsis secondary to UTI (HCC) 01/24/2017  . Acute kidney injury (HCC) 11/19/2016  . Anemia, chronic renal failure, stage 4 (severe) (HCC) 09/17/2016  . Hypoglycemia 06/04/2016  . Hyperkalemia 05/12/2016  . Protein-calorie malnutrition, severe 05/05/2016  . Acute on chronic renal failure (HCC) 05/04/2016  . Convulsions (HCC) 04/17/2016  . Palliative care by specialist   . DNR (do not resuscitate) discussion   . C. difficile diarrhea 03/11/2016  . Anemia 03/11/2016  . Hypotension 03/11/2016  . Acidosis 03/11/2016  . Failure to thrive (child) 03/11/2016  .   Weakness generalized 03/11/2016  . ARF (acute renal failure) (HCC) 03/04/2016  . 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 Absolute anemia 02/04/2016  . Airway hyperreactivity 02/04/2016  . Celiac disease 02/04/2016  . Gastric catarrh 02/04/2016  . Acid reflux 02/04/2016  . Combined fat and carbohydrate induced hyperlipemia 02/04/2016  . C. difficile colitis 01/22/2016  . Urinary obstruction 01/21/2016  . COPD (chronic obstructive pulmonary disease) (HCC) 01/21/2016  . Controlled type 2 diabetes mellitus with stage 4  chronic kidney disease, with long-term current use of insulin (HCC) 01/21/2016  . Essential hypertension 01/21/2016  . Acute renal failure superimposed on stage 4 chronic kidney disease (HCC) 01/20/2016  . Leukocytosis 01/20/2016  . Anemia in chronic kidney disease 01/20/2016  . Arthritis of knee, degenerative 05/10/2015  . Knee strain 03/26/2015  . Other intervertebral disc displacement, lumbar region 03/04/2015  . Degenerative arthritis of lumbar spine 03/04/2015  . HNP (herniated nucleus pulposus), lumbar 03/04/2015  . Osteoarthritis of spine with radiculopathy, lumbar region 03/04/2015  . Injury of tendon of upper extremity 11/12/2014  . Atherosclerosis of abdominal aorta (HCC) 11/02/2014  . Chronic kidney disease, stage IV (severe) (HCC) 11/02/2014  . Obstructive apnea 11/02/2014  . Complete rotator cuff rupture of left shoulder 10/05/2014  . Arthritis of shoulder region, degenerative 10/05/2014  . CAD in native artery 12/08/2013  . Benign essential HTN 12/08/2013  . Type 2 diabetes mellitus (HCC) 12/08/2013    Past Surgical History: Past Surgical History:  Procedure Laterality Date  . A/V SHUNT INTERVENTION N/A 05/12/2018   Procedure: A/V SHUNT INTERVENTION;  Surgeon: Dew, Jason S, MD;  Location: [facility];  Service: Cardiovascular;  Laterality: N/A;  . A/V SHUNT INTERVENTION Left 07/29/2018   Procedure: A/V SHUNT INTERVENTION;  Surgeon: Schnier, Gregory G, MD;  Location: [facility];  Service: Cardiovascular;  Laterality: Left;  . ABDOMINAL HYSTERECTOMY    . AV FISTULA PLACEMENT Left 02/17/2017   Procedure: INSERTION OF ARTERIOVENOUS (AV) GORE-TEX GRAFT ARM(BRACHIAL AXILLARY);  Surgeon: Schnier, Gregory G, MD;  Location: [facility];  Service: Vascular;  Laterality: Left;  . CYSTOSCOPY W/ RETROGRADES Bilateral 02/17/2016   Procedure: CYSTOSCOPY WITH RETROGRADE PYELOGRAM;  Surgeon: Ashley Brandon, MD;  Location: [facility];  Service: Urology;  Laterality: Bilateral;   . CYSTOSCOPY W/ RETROGRADES Bilateral 10/12/2016   Procedure: CYSTOSCOPY WITH RETROGRADE PYELOGRAM;  Surgeon: Ashley Brandon, MD;  Location: [facility];  Service: Urology;  Laterality: Bilateral;  . CYSTOSCOPY W/ RETROGRADES Bilateral 06/09/2017   Procedure: CYSTOSCOPY WITH RETROGRADE PYELOGRAM;  Surgeon: Brandon, Ashley, MD;  Location: [facility];  Service: Urology;  Laterality: Bilateral;  . CYSTOSCOPY W/ RETROGRADES Bilateral 10/07/2017   Procedure: CYSTOSCOPY WITH RETROGRADE PYELOGRAM;  Surgeon: Brandon, Ashley, MD;  Location: [facility];  Service: Urology;  Laterality: Bilateral;  . CYSTOSCOPY W/ URETERAL STENT PLACEMENT Left 05/12/2016   Procedure: CYSTOSCOPY WITH STENT REPLACEMENT;  Surgeon: Ashley Brandon, MD;  Location: [facility];  Service: Urology;  Laterality: Left;  . CYSTOSCOPY W/ URETERAL STENT PLACEMENT Left 10/12/2016   Procedure: CYSTOSCOPY WITH STENT REPLACEMENT;  Surgeon: Ashley Brandon, MD;  Location: [facility];  Service: Urology;  Laterality: Left;  . CYSTOSCOPY WITH BIOPSY N/A 06/09/2017   Procedure: CYSTOSCOPY WITH BLADDER BIOPSY;  Surgeon: Brandon, Ashley, MD;  Location: [facility];  Service: Urology;  Laterality: N/A;  . CYSTOSCOPY WITH STENT PLACEMENT Left 01/21/2016   Procedure: CYSTOSCOPY WITH double J STENT PLACEMENT;  Surgeon: Stephen Dahlstedt, MD;  Location: [facility];  Service: Urology;  Laterality:   Left;  . CYSTOSCOPY WITH STENT PLACEMENT Right 10/12/2016   Procedure: CYSTOSCOPY WITH STENT PLACEMENT;  Surgeon: Ashley Brandon, MD;  Location: [facility];  Service: Urology;  Laterality: Right;  . DIALYSIS/PERMA CATHETER INSERTION N/A 11/20/2016   Procedure: Dialysis/Perma Catheter Insertion;  Surgeon: Dew, Jason S, MD;  Location: [facility];  Service: Cardiovascular;  Laterality: N/A;  . DIALYSIS/PERMA CATHETER REMOVAL N/A 03/30/2017   Procedure: DIALYSIS/PERMA CATHETER REMOVAL;  Surgeon: Schnier, Gregory G, MD;  Location: [facility];  Service: Cardiovascular;   Laterality: N/A;  . KIDNEY SURGERY  01/21/2016   IR NEPHROSTOMY PLACEMENT LEFT   . PEG PLACEMENT N/A 09/20/2019   Procedure: PERCUTANEOUS ENDOSCOPIC GASTROSTOMY (PEG) PLACEMENT;  Surgeon: Kaedyn Polivka K, MD;  Location: [facility];  Service: Gastroenterology;  Laterality: N/A;  . PERIPHERAL VASCULAR CATHETERIZATION N/A 04/07/2016   Procedure: Porta Cath Insertion;  Surgeon: Gregory G Schnier, MD;  Location: [facility];  Service: Cardiovascular;  Laterality: N/A;  . PERIPHERAL VASCULAR THROMBECTOMY Left 06/02/2017   Procedure: PERIPHERAL VASCULAR THROMBECTOMY;  Surgeon: Dew, Jason S, MD;  Location: [facility];  Service: Cardiovascular;  Laterality: Left;  . PERIPHERAL VASCULAR THROMBECTOMY Left 01/28/2018   Procedure: PERIPHERAL VASCULAR THROMBECTOMY;  Surgeon: Schnier, Gregory G, MD;  Location: [facility];  Service: Cardiovascular;  Laterality: Left;  . PERIPHERAL VASCULAR THROMBECTOMY Left 03/14/2018   Procedure: PERIPHERAL VASCULAR THROMBECTOMY;  Surgeon: Dew, Jason S, MD;  Location: [facility];  Service: Cardiovascular;  Laterality: Left;  . PERIPHERAL VASCULAR THROMBECTOMY Left 03/16/2018   Procedure: PERIPHERAL VASCULAR THROMBECTOMY;  Surgeon: Schnier, Gregory G, MD;  Location: [facility];  Service: Cardiovascular;  Laterality: Left;  . PORTA CATH REMOVAL N/A 02/07/2019   Procedure: PORTA CATH REMOVAL;  Surgeon: Schnier, Gregory G, MD;  Location: [facility];  Service: Cardiovascular;  Laterality: N/A;  . PORTACATH PLACEMENT Right   . ROTATOR CUFF REPAIR Left   . TEMPORARY DIALYSIS CATHETER N/A 05/11/2018   Procedure: TEMPORARY DIALYSIS CATHETER;  Surgeon: Schnier, Gregory G, MD;  Location: [facility];  Service: Cardiovascular;  Laterality: N/A;  . TEMPORARY DIALYSIS CATHETER Left 07/27/2018   Procedure: TEMPORARY DIALYSIS CATHETER;  Surgeon: Schnier, Gregory G, MD;  Location: [facility];  Service: Cardiovascular;   Laterality: Left;  . TOTAL HIP ARTHROPLASTY Left 09/05/2019   Procedure: TOTAL HIP ARTHROPLASTY;  Surgeon: Poggi, John J, MD;  Location: [facility];  Service: Orthopedics;  Laterality: Left;  . TRANSURETHRAL RESECTION OF BLADDER TUMOR N/A 02/17/2016   Procedure: TRANSURETHRAL RESECTION OF BLADDER TUMOR (TURBT)-LARGE;  Surgeon: Ashley Brandon, MD;  Location: [facility];  Service: Urology;  Laterality: N/A;  . TRANSURETHRAL RESECTION OF BLADDER TUMOR N/A 10/07/2017   Procedure: TRANSURETHRAL RESECTION OF BLADDER TUMOR (TURBT)-small;  Surgeon: Brandon, Ashley, MD;  Location: [facility];  Service: Urology;  Laterality: N/A;  . URETEROSCOPY Left 02/17/2016   Procedure: URETEROSCOPY;  Surgeon: Ashley Brandon, MD;  Location: [facility];  Service: Urology;  Laterality: Left;  . URETEROSCOPY Right 10/12/2016   Procedure: URETEROSCOPY;  Surgeon: Ashley Brandon, MD;  Location: [facility];  Service: Urology;  Laterality: Right;    Allergies: Allergies  Allergen Reactions  . Glipizide Er Other (See Comments)    Hypoglycemia   . Percocet [Oxycodone-Acetaminophen] Hives    Home Medications: Medications Prior to Admission  Medication Sig Dispense Refill Last Dose  . albuterol (PROVENTIL HFA;VENTOLIN HFA) 108 (90 Base) MCG/ACT inhaler Inhale 2 puffs into the lungs   every 6 (six) hours as needed for wheezing or shortness of breath.    prn at prn  . Amino Acids-Protein Hydrolys (FEEDING SUPPLEMENT, PRO-STAT SUGAR FREE 64,) LIQD Place 30 mLs into feeding tube 2 (two) times daily. 887 mL 0 unknown at unknown  . amLODipine (NORVASC) 5 MG tablet Place 1 tablet (5 mg total) into feeding tube daily.   unknown at unknown  . atorvastatin (LIPITOR) 40 MG tablet Place 40 mg into feeding tube at bedtime.    unknown at unknown  . B Complex-C (B-COMPLEX WITH VITAMIN C) tablet Place 1 tablet into feeding tube at bedtime.   unknown at unknown  . Calcium Acetate 667 MG TABS Place 1,334 mg into feeding tube 3 (three) times daily.   unknown  at unknown  . calcium carbonate (TUMS - DOSED IN MG ELEMENTAL CALCIUM) 500 MG chewable tablet Place 1 tablet into feeding tube at bedtime.    unknown at unknown  . cholecalciferol (VITAMIN D) 1000 units tablet Take 1 tablet (1,000 Units total) by mouth daily.   unknown at unknown  . epoetin alfa (EPOGEN) 10000 UNIT/ML injection Inject 1 mL (10,000 Units total) into the vein every Monday, Wednesday, and Friday with hemodialysis. 1 mL 0 unknown at unknown  . esomeprazole (NEXIUM) 40 MG packet 40 mg daily before breakfast. Per tube   unknown at unknown  . ferrous sulfate 300 (60 Fe) MG/5ML syrup Place 324 mg into feeding tube at bedtime.   unknown at unknown  . gabapentin (NEURONTIN) 300 MG capsule Place 2 capsules (600 mg total) into feeding tube 3 (three) times daily. (Patient taking differently: Place 300 mg into feeding tube 3 (three) times daily. )   unknown at unknown  . heparin 5000 UNIT/ML injection Inject 1 mL (5,000 Units total) into the skin every 12 (twelve) hours. 1 mL 0 unknown at unknown  . lacosamide 100 MG TABS Place 1 tablet (100 mg total) into feeding tube 2 (two) times daily. 60 tablet  unknown at unknown  . multivitamin (RENA-VIT) TABS tablet Place 1 tablet into feeding tube at bedtime.  0 unknown at unknown  . oxybutynin (DITROPAN) 5 MG tablet Place 1 tablet (5 mg total) into feeding tube 3 (three) times daily.   unknown at unknown  . OXYGEN Inhale 2 L into the lungs at bedtime.   unknown at unknown  . Blood Glucose Monitoring Suppl (ACCU-CHEK AVIVA PLUS) w/Device KIT (USE TO MONITOR BLOOD SUGARS.)  0   . NON FORMULARY Place 1 Units into the nose at bedtime. CPAP Time of use 2100-0600     . Water For Irrigation, Sterile (FREE WATER) SOLN Place 50 mLs into feeding tube every 4 (four) hours. (Patient not taking: Reported on 09/27/2019)   Not Taking at Unknown time   Home medication reconciliation was completed with the patient.   Scheduled Inpatient Medications:    Continuous  Inpatient Infusions:   . sodium chloride    . sodium chloride    . lacosamide (VIMPAT) IV 100 mg (09/28/19 1038)  . pantoprozole (PROTONIX) infusion 8 mg/hr (09/28/19 0306)    PRN Inpatient Medications:    Family History: family history includes Cancer in her father; Diabetes in her mother.   GI Family History: Negative  Social History:   reports that she quit smoking about 17 years ago. Her smoking use included cigarettes. She has never used smokeless tobacco. She reports that she does not drink alcohol or use drugs. The patient denies ETOH, tobacco, or drug  use.    Review of Systems: Review of Systems - Unobtainable due to patient confusion.  Physical Examination: BP (!) 133/47   Pulse 81   Temp 98 F (36.7 C) (Oral)   Resp 18   Ht 5' 6" (1.676 m)   Wt 76.1 kg   SpO2 97%   BMI 27.08 kg/m  Physical Exam Constitutional:      Appearance: She is obese. She is not toxic-appearing or diaphoretic.  HENT:     Head: Normocephalic and atraumatic.     Nose: Nose normal.  Eyes:     Extraocular Movements: Extraocular movements intact.     Pupils: Pupils are equal, round, and reactive to light.  Cardiovascular:     Rate and Rhythm: Normal rate and regular rhythm.     Heart sounds: No murmur.  Pulmonary:     Effort: Pulmonary effort is normal.  Abdominal:     General: There is no distension.     Palpations: Abdomen is soft.     Tenderness: There is no abdominal tenderness.  Neurological:     Mental Status: She is alert.     Data: Lab Results  Component Value Date   WBC 15.2 (H) 09/28/2019   HGB 6.1 (L) 09/28/2019   HCT 19.9 (L) 09/28/2019   MCV 96.1 09/28/2019   PLT 239 09/28/2019   Recent Labs  Lab 09/22/19 0535 09/27/19 2214 09/28/19 0636  HGB 8.1* 4.8* 6.1*   Lab Results  Component Value Date   NA 133 (L) 09/28/2019   K 3.5 09/28/2019   CL 97 (L) 09/28/2019   CO2 27 09/28/2019   BUN 43 (H) 09/28/2019   CREATININE 4.99 (H) 09/28/2019   Lab Results   Component Value Date   ALT 17 09/27/2019   AST 36 09/27/2019   ALKPHOS 56 09/27/2019   BILITOT 0.3 09/27/2019   Recent Labs  Lab 09/27/19 2214  APTT 35  INR 1.2   CBC Latest Ref Rng & Units 09/28/2019 09/27/2019 09/22/2019  WBC 4.0 - 10.5 K/uL 15.2(H) 19.7(H) 13.2(H)  Hemoglobin 12.0 - 15.0 g/dL 6.1(L) 4.8(LL) 8.1(L)  Hematocrit 36.0 - 46.0 % 19.9(L) 16.5(L) 26.9(L)  Platelets 150 - 400 K/uL 239 246 349    STUDIES: DG ABDOMEN PEG TUBE LOCATION  Result Date: 09/28/2019 CLINICAL DATA:  Peg tube location. EXAM: ABDOMEN - 1 VIEW COMPARISON:  09/13/2019 FINDINGS: A gastrostomy tube projects over the gastric bubble. The bowel gas pattern is nonobstructive. Extensive phleboliths project over the patient's pelvis. The patient is status post prior total hip arthroplasty on the left. IMPRESSION: Gastrostomy tube projects over the stomach. Electronically Signed   By: Constance Holster M.D.   On: 09/28/2019 01:41   DG Chest Port 1 View  Result Date: 09/28/2019 CLINICAL DATA:  Central line placement EXAM: PORTABLE CHEST 1 VIEW COMPARISON:  September 22, 2019 FINDINGS: There is a right-sided central venous catheter with tip projecting over the right lung apex. This may be in the region of the distal right internal jugular vein. Exact positioning is difficult to determine on this study. A stent projects over the patient's upper mediastinum. The heart size is stable. There is stable blunting of the left costophrenic angle. There is no pneumothorax. IMPRESSION: 1. Right-sided central venous catheter as above.  No pneumothorax. 2. Relatively stable appearance of the chest when compared to prior x-ray. Electronically Signed   By: Constance Holster M.D.   On: 09/28/2019 01:40   _0 @  Assessment:  1. UGI bleed -  likely secondary to local trauma from self removal of PEG tube.  2. Anemia secondary to GI blood loss.  3. Feeding problem.   4. ESRD on hemodialysis.  COVID-19 status: Tested negative.    Recommendations:  1. IV PPI as you are doing.  2. EGD with possible injection/cautery hemostasis. I discussed the procedure with the patient's husband who understands the nature of the planned procedure, indications, risks, alternatives and potential complications including but not limited to bleeding, infection, perforation, damage to internal organs and possible oversedation/side effects from anesthesia. The patient's husband agrees and gives consent to proceed.  Please refer to procedure notes for findings, recommendations and patient disposition/instructions.   Thank you for the consult. Please call with questions or concerns.  Barbara [name], "Barbara" MD [facility] [address] [place], [place] [address] [phone]  09/28/2019 11:18 AM     

## 2019-09-28 NOTE — Transfer of Care (Signed)
Immediate Anesthesia Transfer of Care Note  Patient: Barbara [name]  Procedure(s) Performed: ESOPHAGOGASTRODUODENOSCOPY (EGD) WITH PROPOFOL (N/A )  Patient Location: PACU  Anesthesia Type:General  Level of Consciousness: awake and drowsy  Airway & Oxygen Therapy: Patient Spontanous Breathing and Patient connected to face mask oxygen  Post-op Assessment: Report given to RN and Post -op Vital signs reviewed and stable  Post vital signs: Reviewed and stable  Last Vitals:  Vitals Value Taken Time  BP 104/50 09/28/19 1650  Temp 36.3 C 09/28/19 1650  Pulse 71 09/28/19 1655  Resp 21 09/28/19 1655  SpO2 99 % 09/28/19 1655  Vitals shown include unvalidated device data.  Last Pain:  Vitals:   09/28/19 1528  TempSrc: Oral  PainSc:          Complications: No apparent anesthesia complications

## 2019-09-28 NOTE — Progress Notes (Signed)
[facility]  ROUNDING NOTE   Subjective:  Patient recently discharged on 09/24/2019. Now presents with acute GI bleed. Apparently also pulled out her PEG tube. Hemoglobin was quite low upon admission at 4.8. She underwent transfusion and hemoglobin still low at 6.1.   Objective:  Vital signs in last 24 hours:  Temp:  [97.4 F (36.3 C)-98.9 F (37.2 C)] 98 F (36.7 C) (03/11 1030) Pulse Rate:  [63-105] 81 (03/11 1030) Resp:  [8-25] 18 (03/11 1030) BP: (104-135)/(32-54) 133/47 (03/11 1030) SpO2:  [81 %-100 %] 97 % (03/11 1030) Weight:  [76.1 kg-78.8 kg] 76.1 kg (03/11 0308)  Weight change:  Filed Weights   09/27/19 1910 09/28/19 0308  Weight: 78.8 kg 76.1 kg    Intake/Output: I/O last 3 completed shifts: In: 365 [Blood:365] Out: -    Intake/Output this shift:  Total I/O In: 340 [Blood:340] Out: -    Physical Exam: General:  Laying in the bed, no acute distress  HEENT:  Anicteric, moist oral mucous membranes  Lungs:   Normal breathing effort, clear bilateral  Heart:  S1S2, no rubs  Abdomen:   Soft, nontender, bowel sounds present  Extremities:  No peripheral edema  Neurologic:  Awake, alert  Skin:  Warm  Access:  Left arm AV graft    Basic Metabolic Panel: Recent Labs  Lab 09/22/19 0529 09/23/19 0453 09/24/19 0555 09/27/19 2214 09/28/19 0636  NA 137  --   --  133* 133*  K 3.2* 3.6  --  3.5 3.5  CL 97*  --   --  96* 97*  CO2 26  --   --  27 27  GLUCOSE 131*  --   --  121* 99  BUN 51*  --   --  39* 43*  CREATININE 7.64*  --   --  4.37* 4.99*  CALCIUM 8.3*  --   --  8.5* 8.4*  MG 2.0 2.1 2.1  --   --   PHOS 6.6* 4.3 4.6  --   --     Liver Function Tests: Recent Labs  Lab 09/27/19 2214  AST 36  ALT 17  ALKPHOS 56  BILITOT 0.3  PROT 7.5  ALBUMIN 2.5*   Recent Labs  Lab 09/27/19 2214  LIPASE 63*   No results for input(s): AMMONIA in the last 168 hours.  CBC: Recent Labs  Lab 09/22/19 0535 09/27/19 2214 09/28/19 0636   WBC 13.2* 19.7* 15.2*  HGB 8.1* 4.8* 6.1*  HCT 26.9* 16.5* 19.9*  MCV 101.5* 101.9* 96.1  PLT 349 246 239    Cardiac Enzymes: No results for input(s): CKTOTAL, CKMB, CKMBINDEX, TROPONINI in the last 168 hours.  BNP: Invalid input(s): POCBNP  CBG: Recent Labs  Lab 09/24/19 0018 09/24/19 0418 09/24/19 0745 09/24/19 1144 09/28/19 0256  GLUCAP 131* 136* 132* 115* 100*    Microbiology: Results for orders placed or performed during the hospital encounter of 09/27/19  SARS CORONAVIRUS 2 (TAT 6-24 HRS) Nasopharyngeal Nasopharyngeal Swab     Status: None   Collection Time: 09/28/19  1:40 AM   Specimen: Nasopharyngeal Swab  Result Value Ref Range Status   SARS Coronavirus 2 NEGATIVE NEGATIVE Final    Comment: (NOTE) SARS-CoV-2 target nucleic acids are NOT DETECTED. The SARS-CoV-2 RNA is generally detectable in upper and lower respiratory specimens during the acute phase of infection. Negative results do not preclude SARS-CoV-2 infection, do not rule out co-infections with other pathogens, and should not be used as the sole basis for  treatment or other patient management decisions. Negative results must be combined with clinical observations, patient history, and epidemiological information. The expected result is Negative. Fact Sheet for Patients: SugarRoll.be Fact Sheet for Healthcare Providers: https://www.woods-mathews.com/ This test is not yet approved or cleared by the [place] FDA and  has been authorized for detection and/or diagnosis of SARS-CoV-2 by FDA under an Emergency Use Authorization (EUA). This EUA will remain  in effect (meaning this test can be used) for the duration of the COVID-19 declaration under Section 56 4(b)(1) of the Act, 21 U.S.C. section 360bbb-3(b)(1), unless the authorization is terminated or revoked sooner. Performed at [facility], [place] [address], [place], [place] [address]   MRSA PCR  Screening     Status: None   Collection Time: 09/28/19  3:08 AM   Specimen: Nasal Mucosa; Nasopharyngeal  Result Value Ref Range Status   MRSA by PCR NEGATIVE NEGATIVE Final    Comment:        The GeneXpert MRSA Assay (FDA approved for NASAL specimens only), is one component of a comprehensive MRSA colonization surveillance program. It is not intended to diagnose MRSA infection nor to guide or monitor treatment for MRSA infections. Performed at [facility], [address] [place], [place] [address]     Coagulation Studies: Recent Labs    09/27/19 09-25-12  LABPROT 15.1  INR 1.2    Urinalysis: No results for input(s): COLORURINE, LABSPEC, PHURINE, GLUCOSEU, HGBUR, BILIRUBINUR, KETONESUR, PROTEINUR, UROBILINOGEN, NITRITE, LEUKOCYTESUR in the last 72 hours.  Invalid input(s): APPERANCEUR    Imaging: DG ABDOMEN PEG TUBE LOCATION  Result Date: 09/28/2019 CLINICAL DATA:  Peg tube location. EXAM: ABDOMEN - 1 VIEW COMPARISON:  09/13/2019 FINDINGS: A gastrostomy tube projects over the gastric bubble. The bowel gas pattern is nonobstructive. Extensive phleboliths project over the patient's pelvis. The patient is status post prior total hip arthroplasty on the left. IMPRESSION: Gastrostomy tube projects over the stomach. Electronically Signed   By: Constance Holster M.D.   On: 09/28/2019 01:41   DG Chest Port 1 View  Result Date: 09/28/2019 CLINICAL DATA:  Central line placement EXAM: PORTABLE CHEST 1 VIEW COMPARISON:  September 22, 2019 FINDINGS: There is a right-sided central venous catheter with tip projecting over the right lung apex. This may be in the region of the distal right internal jugular vein. Exact positioning is difficult to determine on this study. A stent projects over the patient's upper mediastinum. The heart size is stable. There is stable blunting of the left costophrenic angle. There is no pneumothorax. IMPRESSION: 1. Right-sided central venous catheter as  above.  No pneumothorax. 2. Relatively stable appearance of the chest when compared to prior x-ray. Electronically Signed   By: Constance Holster M.D.   On: 09/28/2019 01:40     Medications:   . sodium chloride    . sodium chloride    . lacosamide (VIMPAT) IV 100 mg (09/28/19 1038)  . pantoprozole (PROTONIX) infusion 8 mg/hr (09/28/19 0306)      Assessment/ Plan:    [name] is a 79 y.o. black female with end stage renal disease on hemodialysis, hypertension, diabetes mellitus type II, diabetic neuropathy, seizure disorder, left hip repair, anemia of chronic kidney disease, secondary hyperparathyroidism  [facility] MWF [facility] LUE AVG 79.5kg   1.  ESRD on HD MWF.  The patient's hemoglobin remains low at the moment at 6.1.  Electrolytes acceptable.  No signs of volume overload.  Hold off on  dialysis for now.  Reevaluate for dialysis tomorrow.  2.  Acute GI bleed. Hemoglobin down to 6.1.  Defer decisions regarding transfusion to primary team.   3.  Anemia of chronic kidney disease.   Lab Results  Component Value Date   HGB 6.1 (L) 09/28/2019  We will resume Epogen with dialysis treatments.  4.  Secondary hyperparathyroidism.   Lab Results  Component Value Date   PTH 310 (H) 09/06/2019   CALCIUM 8.4 (L) 09/28/2019   CAION 0.85 (LL) 09/05/2019   PHOS 4.6 09/24/2019  Continue to monitor abdominal metabolism parameters periodically.      LOS: 0 Barbara [name] 3/11/202111:25 AM

## 2019-09-28 NOTE — Progress Notes (Addendum)
In all lumens of IJ, this RN was unable to get blood return. IV team consulted to come declot line. IV team called this RN to tell her to stop infusions and that she would be unable to declot line due to it "being located in the lung per prior chest xray." This RN stopped protonix drip and contacted Dr. Posey Pronto. Posey Pronto came to bedside and ordered a STAT repeat chest X-ray. IV team was able to place a peripheral IV in pt right forearm.

## 2019-09-28 NOTE — ED Notes (Signed)
Dr. Quentin Cornwall at the bedside for central line placement.

## 2019-09-28 NOTE — Interval H&P Note (Signed)
History and Physical Interval Note:  09/28/2019 4:14 PM  Barbara [name]  has presented today for surgery, with the diagnosis of hemetemesis, anemia secondary to acute blood loss.  The various methods of treatment have been discussed with the patient and family. After consideration of risks, benefits and other options for treatment, the patient has consented to  Procedure(s): ESOPHAGOGASTRODUODENOSCOPY (EGD) WITH PROPOFOL (N/A) as a surgical intervention.  The patient's history has been reviewed, patient examined, no change in status, stable for surgery.  I have reviewed the patient's chart and labs.  Questions were answered to the patient's satisfaction.     [place], [place]

## 2019-09-28 NOTE — ED Provider Notes (Signed)
Patient with extensive past medical history being admitted for suspected GI bleeding acute blood loss anemia.  Patient vasculopath with very difficult IV access with multiple attempts failed her ultrasound IV has failed due to limited access was asked to come assist with central line placement.  Linus Salmons Line  Date/Time: 09/28/2019 12:58 AM Performed by: Merlyn Lot, MD Authorized by: Merlyn Lot, MD   Consent:    Consent obtained:  Emergent situation Pre-procedure details:    Hand hygiene: Hand hygiene performed prior to insertion     Sterile barrier technique: All elements of maximal sterile technique followed     Skin preparation:  2% chlorhexidine   Skin preparation agent: Skin preparation agent completely dried prior to procedure   Procedure details:    Location:  R internal jugular   Patient position:  Trendelenburg   Procedural supplies:  Triple lumen   Catheter size:  12 Fr   Landmarks identified: yes     Ultrasound guidance: yes     Sterile ultrasound techniques: Sterile gel and sterile probe covers were used     Number of attempts:  1   Successful placement: yes   Post-procedure details:    Post-procedure:  Dressing applied and line sutured   Assessment:  Blood return through all ports and free fluid flow   Patient tolerance of procedure:  Tolerated well, no immediate complications      Merlyn Lot, MD 09/28/19 [phone]

## 2019-09-28 NOTE — ED Notes (Signed)
Central line placed. Pt tolerated well.

## 2019-09-28 NOTE — Anesthesia Procedure Notes (Signed)
Procedure Name: Intubation Date/Time: 09/28/2019 4:22 PM Performed by: Johnna Acosta, CRNA Pre-anesthesia Checklist: Patient identified, Emergency Drugs available, Suction available, Patient being monitored and Timeout performed Patient Re-evaluated:Patient Re-evaluated prior to induction Oxygen Delivery Method: Circle system utilized Preoxygenation: Pre-oxygenation with 100% oxygen Induction Type: IV induction, Cricoid Pressure applied and Rapid sequence Laryngoscope Size: McGraph and 3 Grade View: Grade II Tube type: Oral Tube size: 7.0 mm Number of attempts: 1 Airway Equipment and Method: Stylet and Video-laryngoscopy Placement Confirmation: ETT inserted through vocal cords under direct vision,  positive ETCO2 and breath sounds checked- equal and bilateral Secured at: 21 cm Tube secured with: Tape Dental Injury: Teeth and Oropharynx as per pre-operative assessment  Difficulty Due To: Difficulty was anticipated and Difficult Airway- due to limited oral opening

## 2019-09-28 NOTE — Progress Notes (Signed)
Lab called to obtain blood samples, unable to pull blood back from either central line port. NP notified.

## 2019-09-28 NOTE — ED Notes (Signed)
Xray at the bedside.

## 2019-09-28 NOTE — Anesthesia Preprocedure Evaluation (Addendum)
Anesthesia Evaluation  Patient identified by MRN, date of birth, ID band Patient awake    Reviewed: Allergy & Precautions, H&P , NPO status , Patient's Chart, lab work & pertinent test results  Airway   TM Distance: >3 FB    Comment: Unable to comply with mallampati Dental  (+) Missing, Chipped   Pulmonary shortness of breath, asthma , sleep apnea , COPD, former smoker,           Cardiovascular hypertension, + CAD and +CHF    1. Left ventricular ejection fraction, by estimation, is 60 to 65%. The  left ventricle has normal function. The left ventricle has no regional  wall motion abnormalities. Left ventricular diastolic parameters are  consistent with Grade I diastolic  dysfunction (impaired relaxation).  2. Right ventricular systolic function is normal. The right ventricular  size is normal. Tricuspid regurgitation signal is inadequate for assessing  PA pressure.    Neuro/Psych Seizures -,  PSYCHIATRIC DISORDERS Dementia somnolent    GI/Hepatic Neg liver ROS, GERD  ,  Endo/Other  diabetes  Renal/GU ESRF and DialysisRenal disease (last dialyzed yesterday)     Musculoskeletal   Abdominal   Peds  Hematology  (+) Blood dyscrasia, anemia ,   Anesthesia Other Findings Past Medical History: No date: Adult celiac disease 2017: Anemia associated with chronic renal failure     Comment:  blood transfusion last week 10/17 No date: Arthritis No date: Asthma 2017: Cancer ([place])     Comment:  bladder No date: Chronic kidney disease No date: CKD (chronic kidney disease)     Comment:  stage IV kidney disease.  dr. Candiss Norse and dr. Holley Raring               follow her No date: COPD (chronic obstructive pulmonary disease) ([place]) No date: Coronary artery disease No date: Diabetes mellitus without complication ([place]) No date: Dialysis patient [facility])     Comment:  Tues, Thurs, Sat No date: Dyspnea     Comment:  with exertion No  date: Elevated lipids No date: GERD (gastroesophageal reflux disease) No date: Hematuria No date: Hypertension No date: Lower back pain No date: Neuropathy No date: Oxygen dependent     Comment:  at hs 2017: Personal history of chemotherapy     Comment:  bladder ca 2017: Personal history of radiation therapy     Comment:  bladder ca No date: Sleep apnea     Comment:  uses cpap 01/2016: Urinary obstruction  Past Surgical History: 05/12/2018: A/V SHUNT INTERVENTION; N/A     Comment:  Procedure: A/V SHUNT INTERVENTION;  Surgeon: Algernon Huxley, MD;  Location: [place] [facility];  Service:               Cardiovascular;  Laterality: N/A; 07/29/2018: A/V SHUNT INTERVENTION; Left     Comment:  Procedure: A/V SHUNT INTERVENTION;  Surgeon: Katha Cabal, MD;  Location: [place] [facility];  Service:              Cardiovascular;  Laterality: Left; No date: ABDOMINAL HYSTERECTOMY 02/17/2017: AV FISTULA PLACEMENT; Left     Comment:  Procedure: INSERTION OF ARTERIOVENOUS (AV) GORE-TEX               GRAFT ARM(BRACHIAL AXILLARY);  Surgeon: [name]  G, MD;  Location: [facility];  Service: Vascular;                Laterality: Left; 02/17/2016: CYSTOSCOPY W/ RETROGRADES; Bilateral     Comment:  Procedure: CYSTOSCOPY WITH RETROGRADE PYELOGRAM;                Surgeon: Hollice Espy, MD;  Location: [facility];                Service: Urology;  Laterality: Bilateral; 10/12/2016: CYSTOSCOPY W/ RETROGRADES; Bilateral     Comment:  Procedure: CYSTOSCOPY WITH RETROGRADE PYELOGRAM;                Surgeon: Hollice Espy, MD;  Location: [facility];                Service: Urology;  Laterality: Bilateral; 06/09/2017: CYSTOSCOPY W/ RETROGRADES; Bilateral     Comment:  Procedure: CYSTOSCOPY WITH RETROGRADE PYELOGRAM;                Surgeon: Hollice Espy, MD;  Location: [facility];                Service: Urology;  Laterality: Bilateral; 10/07/2017: CYSTOSCOPY  W/ RETROGRADES; Bilateral     Comment:  Procedure: CYSTOSCOPY WITH RETROGRADE PYELOGRAM;                Surgeon: Hollice Espy, MD;  Location: [facility];                Service: Urology;  Laterality: Bilateral; 05/12/2016: CYSTOSCOPY W/ URETERAL STENT PLACEMENT; Left     Comment:  Procedure: CYSTOSCOPY WITH STENT REPLACEMENT;  Surgeon:               Hollice Espy, MD;  Location: [facility];  Service:               Urology;  Laterality: Left; 10/12/2016: CYSTOSCOPY W/ URETERAL STENT PLACEMENT; Left     Comment:  Procedure: CYSTOSCOPY WITH STENT REPLACEMENT;  Surgeon:               Hollice Espy, MD;  Location: [facility];  Service:               Urology;  Laterality: Left; 06/09/2017: CYSTOSCOPY WITH BIOPSY; N/A     Comment:  Procedure: CYSTOSCOPY WITH BLADDER BIOPSY;  Surgeon:               Hollice Espy, MD;  Location: [facility];  Service:               Urology;  Laterality: N/A; 01/21/2016: CYSTOSCOPY WITH STENT PLACEMENT; Left     Comment:  Procedure: CYSTOSCOPY WITH double J STENT PLACEMENT;                Surgeon: Franchot Gallo, MD;  Location: [facility];                Service: Urology;  Laterality: Left; 10/12/2016: CYSTOSCOPY WITH STENT PLACEMENT; Right     Comment:  Procedure: [place];  Surgeon:               Hollice Espy, MD;  Location: [facility];  Service:               Urology;  Laterality: Right; 11/20/2016: DIALYSIS/PERMA CATHETER INSERTION; N/A     Comment:  Procedure: Dialysis/Perma Catheter Insertion;  Surgeon:               Algernon Huxley,  MD;  Location: [place] [facility];                Service: Cardiovascular;  Laterality: N/A; 03/30/2017: DIALYSIS/PERMA CATHETER REMOVAL; N/A     Comment:  Procedure: DIALYSIS/PERMA CATHETER REMOVAL;  Surgeon:               Katha Cabal, MD;  Location: [place] [facility];               Service: Cardiovascular;  Laterality: N/A; 01/21/2016: KIDNEY SURGERY     Comment:  IR NEPHROSTOMY PLACEMENT LEFT   09/20/2019: PEG PLACEMENT; N/A     Comment:  Procedure: PERCUTANEOUS ENDOSCOPIC GASTROSTOMY (PEG)               PLACEMENT;  Surgeon: [place], Benay Pike, MD;  Location:               [facility];  Service: Gastroenterology;  Laterality:               N/A; 04/07/2016: PERIPHERAL VASCULAR CATHETERIZATION; N/A     Comment:  Procedure: Porta Cath Insertion;  Surgeon: Katha Cabal, MD;  Location: [place] [facility];  Service:               Cardiovascular;  Laterality: N/A; 06/02/2017: PERIPHERAL VASCULAR THROMBECTOMY; Left     Comment:  Procedure: PERIPHERAL VASCULAR THROMBECTOMY;  Surgeon:               Algernon Huxley, MD;  Location: [place] [facility];                Service: Cardiovascular;  Laterality: Left; 01/28/2018: PERIPHERAL VASCULAR THROMBECTOMY; Left     Comment:  Procedure: PERIPHERAL VASCULAR THROMBECTOMY;  Surgeon:               Katha Cabal, MD;  Location: [place] [facility];               Service: Cardiovascular;  Laterality: Left; 03/14/2018: PERIPHERAL VASCULAR THROMBECTOMY; Left     Comment:  Procedure: PERIPHERAL VASCULAR THROMBECTOMY;  Surgeon:               Algernon Huxley, MD;  Location: [place] [facility];                Service: Cardiovascular;  Laterality: Left; 03/16/2018: PERIPHERAL VASCULAR THROMBECTOMY; Left     Comment:  Procedure: PERIPHERAL VASCULAR THROMBECTOMY;  Surgeon:               Katha Cabal, MD;  Location: [place] [facility];               Service: Cardiovascular;  Laterality: Left; 02/07/2019: PORTA CATH REMOVAL; N/A     Comment:  Procedure: PORTA CATH REMOVAL;  Surgeon: Katha Cabal, MD;  Location: [place] [facility];  Service:              Cardiovascular;  Laterality: N/A; No date: PORTACATH PLACEMENT; Right No date: ROTATOR CUFF REPAIR; Left 05/11/2018: TEMPORARY DIALYSIS CATHETER; N/A     Comment:  Procedure: TEMPORARY DIALYSIS CATHETER;  Surgeon:               Katha Cabal, MD;   Location: [place] [facility];  Service: Cardiovascular;  Laterality: N/A; 07/27/2018: TEMPORARY DIALYSIS CATHETER; Left     Comment:  Procedure: TEMPORARY DIALYSIS CATHETER;  Surgeon:               Katha Cabal, MD;  Location: [place] [facility];               Service: Cardiovascular;  Laterality: Left; 09/05/2019: TOTAL HIP ARTHROPLASTY; Left     Comment:  Procedure: TOTAL HIP ARTHROPLASTY;  Surgeon: Corky Mull, MD;  Location: [facility];  Service: Orthopedics;                Laterality: Left; 02/17/2016: TRANSURETHRAL RESECTION OF BLADDER TUMOR; N/A     Comment:  Procedure: TRANSURETHRAL RESECTION OF BLADDER TUMOR               (TURBT)-LARGE;  Surgeon: Hollice Espy, MD;  Location:               [facility];  Service: Urology;  Laterality: N/A; 10/07/2017: TRANSURETHRAL RESECTION OF BLADDER TUMOR; N/A     Comment:  Procedure: TRANSURETHRAL RESECTION OF BLADDER TUMOR               (TURBT)-small;  Surgeon: Hollice Espy, MD;  Location:               [facility];  Service: Urology;  Laterality: N/A; 02/17/2016: URETEROSCOPY; Left     Comment:  Procedure: URETEROSCOPY;  Surgeon: Hollice Espy, MD;                Location: [facility];  Service: Urology;  Laterality: Left; 10/12/2016: URETEROSCOPY; Right     Comment:  Procedure: URETEROSCOPY;  Surgeon: Hollice Espy, MD;                Location: [facility];  Service: Urology;  Laterality:               Right;  BMI    Body Mass Index: 27.08 kg/m      Reproductive/Obstetrics negative OB ROS                           Anesthesia Physical Anesthesia Plan  ASA: III  Anesthesia Plan: General ETT   Post-op Pain Management:    Induction:   PONV Risk Score and Plan: Ondansetron, Dexamethasone and Treatment may vary due to age or medical condition  Airway Management Planned:   Additional Equipment:   Intra-op Plan:   Post-operative Plan:   Informed Consent: I have reviewed the  patients History and Physical, chart, labs and discussed the procedure including the risks, benefits and alternatives for the proposed anesthesia with the patient or authorized representative who has indicated his/her understanding and acceptance.     Dental Advisory Given  Plan Discussed with: Anesthesiologist  Anesthesia Plan Comments: (Consent for anesthesia obtained from pt's husband at bedside.  KR)      Anesthesia Quick Evaluation

## 2019-09-28 NOTE — ED Notes (Signed)
Unable to flush or see blood return on current IV in right upper arm. 3 additional failed attempts to gain IV access. Admitting aware.

## 2019-09-28 NOTE — H&P (Addendum)
History and Physical    ALFA LEIBENSPERGER JOA:[id number] DOB: January 29, 1941 DOA: 09/27/2019  PCP: [name], MD  Patient coming from: [facility]  I have personally briefly reviewed patient's old medical records in [place]  Chief Complaint: Acute anemia  HPI: Barbara [name] is a 79 y.o. female with medical history significant for ESRD on HD MWF, hyperlipidemia, type 2 diabetes, COPD, asthma, GERD, bladder cancer, and iron deficiency anemia who presents for concerns of acute GI bleed and anemia.  Patient was unable to provide any history but was alert and oriented x4.  Patient was recently hospitalized from 2/16-3/7.  She was initially admitted by orthopedic for left total hip arthroplasty on 09/05/2019 however she later developed acute respiratory distress and was subsequently transferred to ICU for acute hypoxemic and hypercarbic respiratory failure ultimately requiring intubation and mechanical ventilation.  She was also treated with IV antibiotics for aspiration pneumonia.  She required vasopressor for hypotension.  She was also initially placed on CRRT and then transition to hemodialysis by nephrology. She was seen by neurologist at that time for altered mental status and seizure activity and was started on Vimpat. She had PEG placement on 09/20/2019 for tube feeding due to dysphagia.  She was then discharged to [facility].  Reportedly she had 2 days of coffee-ground emesis which the patient does not recall.  Also had pulled out her PEG tube.  Facility noted her to have hemoglobin of 5 and sent her to ER for further evaluation.  ED Course: She was afebrile, normotensive and required 2 L on room air. WBC of 19.7 from prior of 13.2.  Hemoglobin of 4.8 with a baseline of around 8-9. Sodium of 133, glucose of 121, creatinine of 4.37 from prior of 7.6 for a week ago. Per ED physician Dr. Archie Balboa patient had a grossly melanotic stool but no FOBT  documented in Epic.   Patient was started on IV Protonix drip and has blood transfusion pending but is having difficulty with peripheral access.   Review of Systems:  Unable to fully obtain due to patient being a poor historian although she denies any frank pain  Past Medical History:  Diagnosis Date  . Adult celiac disease   . Anemia associated with chronic renal failure 2017   blood transfusion last week 10/17  . Arthritis   . Asthma   . Cancer ([place]) 2017   bladder  . Chronic kidney disease   . 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  dr. Candiss Norse and dr. Holley Raring follow her  . COPD (chronic obstructive pulmonary disease) ([place])   . Coronary artery disease   . Diabetes mellitus without complication ([place])   . Dialysis patient ([place])    Tues, Thurs, Sat  . Dyspnea    with exertion  . Elevated lipids   . GERD (gastroesophageal reflux disease)   . Hematuria   . Hypertension   . Lower back pain   . Neuropathy   . Oxygen dependent    at hs  . Personal history of chemotherapy 2017   bladder ca  . Personal history of radiation therapy 2017   bladder ca  . Sleep apnea    uses cpap  . Urinary obstruction 01/2016    Past Surgical History:  Procedure Laterality Date  . A/V SHUNT INTERVENTION N/A 05/12/2018   Procedure: A/V SHUNT INTERVENTION;  Surgeon: Algernon Huxley, MD;  Location: [place] [facility];  Service:  Cardiovascular;  Laterality: N/A;  . A/V SHUNT INTERVENTION Left 07/29/2018   Procedure: A/V SHUNT INTERVENTION;  Surgeon: Katha Cabal, MD;  Location: [place] [facility];  Service: Cardiovascular;  Laterality: Left;  . ABDOMINAL HYSTERECTOMY    . AV FISTULA PLACEMENT Left 02/17/2017   Procedure: INSERTION OF ARTERIOVENOUS (AV) GORE-TEX GRAFT ARM(BRACHIAL AXILLARY);  Surgeon: Katha Cabal, MD;  Location: [facility];  Service: Vascular;  Laterality: Left;  . CYSTOSCOPY W/ RETROGRADES Bilateral 02/17/2016   Procedure: CYSTOSCOPY WITH RETROGRADE  PYELOGRAM;  Surgeon: Hollice Espy, MD;  Location: [facility];  Service: Urology;  Laterality: Bilateral;  . CYSTOSCOPY W/ RETROGRADES Bilateral 10/12/2016   Procedure: CYSTOSCOPY WITH RETROGRADE PYELOGRAM;  Surgeon: Hollice Espy, MD;  Location: [facility];  Service: Urology;  Laterality: Bilateral;  . CYSTOSCOPY W/ RETROGRADES Bilateral 06/09/2017   Procedure: CYSTOSCOPY WITH RETROGRADE PYELOGRAM;  Surgeon: Hollice Espy, MD;  Location: [facility];  Service: Urology;  Laterality: Bilateral;  . CYSTOSCOPY W/ RETROGRADES Bilateral 10/07/2017   Procedure: CYSTOSCOPY WITH RETROGRADE PYELOGRAM;  Surgeon: Hollice Espy, MD;  Location: [facility];  Service: Urology;  Laterality: Bilateral;  . CYSTOSCOPY W/ URETERAL STENT PLACEMENT Left 05/12/2016   Procedure: CYSTOSCOPY WITH STENT REPLACEMENT;  Surgeon: Hollice Espy, MD;  Location: [facility];  Service: Urology;  Laterality: Left;  . CYSTOSCOPY W/ URETERAL STENT PLACEMENT Left 10/12/2016   Procedure: CYSTOSCOPY WITH STENT REPLACEMENT;  Surgeon: Hollice Espy, MD;  Location: [facility];  Service: Urology;  Laterality: Left;  . CYSTOSCOPY WITH BIOPSY N/A 06/09/2017   Procedure: CYSTOSCOPY WITH BLADDER BIOPSY;  Surgeon: Hollice Espy, MD;  Location: [facility];  Service: Urology;  Laterality: N/A;  . CYSTOSCOPY WITH STENT PLACEMENT Left 01/21/2016   Procedure: CYSTOSCOPY WITH double J STENT PLACEMENT;  Surgeon: Franchot Gallo, MD;  Location: [facility];  Service: Urology;  Laterality: Left;  . CYSTOSCOPY WITH STENT PLACEMENT Right 10/12/2016   Procedure: CYSTOSCOPY WITH STENT PLACEMENT;  Surgeon: Hollice Espy, MD;  Location: [facility];  Service: Urology;  Laterality: Right;  . DIALYSIS/PERMA CATHETER INSERTION N/A 11/20/2016   Procedure: Dialysis/Perma Catheter Insertion;  Surgeon: Algernon Huxley, MD;  Location: [place] [facility];  Service: Cardiovascular;  Laterality: N/A;  . DIALYSIS/PERMA CATHETER REMOVAL N/A 03/30/2017   Procedure: DIALYSIS/PERMA CATHETER  REMOVAL;  Surgeon: Katha Cabal, MD;  Location: [place] [facility];  Service: Cardiovascular;  Laterality: N/A;  . KIDNEY SURGERY  01/21/2016   IR NEPHROSTOMY PLACEMENT LEFT   . PEG PLACEMENT N/A 09/20/2019   Procedure: PERCUTANEOUS ENDOSCOPIC GASTROSTOMY (PEG) PLACEMENT;  Surgeon: Toledo, Benay Pike, MD;  Location: [facility];  Service: Gastroenterology;  Laterality: N/A;  . PERIPHERAL VASCULAR CATHETERIZATION N/A 04/07/2016   Procedure: Glori Luis Cath Insertion;  Surgeon: Katha Cabal, MD;  Location: [place] [facility];  Service: Cardiovascular;  Laterality: N/A;  . PERIPHERAL VASCULAR THROMBECTOMY Left 06/02/2017   Procedure: PERIPHERAL VASCULAR THROMBECTOMY;  Surgeon: Algernon Huxley, MD;  Location: [place] [facility];  Service: Cardiovascular;  Laterality: Left;  . PERIPHERAL VASCULAR THROMBECTOMY Left 01/28/2018   Procedure: PERIPHERAL VASCULAR THROMBECTOMY;  Surgeon: Katha Cabal, MD;  Location: [place] [facility];  Service: Cardiovascular;  Laterality: Left;  . PERIPHERAL VASCULAR THROMBECTOMY Left 03/14/2018   Procedure: PERIPHERAL VASCULAR THROMBECTOMY;  Surgeon: Algernon Huxley, MD;  Location: [place] [facility];  Service: Cardiovascular;  Laterality: Left;  . PERIPHERAL VASCULAR THROMBECTOMY Left 03/16/2018   Procedure: PERIPHERAL VASCULAR THROMBECTOMY;  Surgeon: Katha Cabal, MD;  Location: [place] [facility];  Service: Cardiovascular;  Laterality: Left;  . PORTA CATH REMOVAL N/A 02/07/2019   Procedure: PORTA CATH REMOVAL;  Surgeon: Katha Cabal, MD;  Location: [place] [facility];  Service: Cardiovascular;  Laterality: N/A;  . PORTACATH PLACEMENT Right   . ROTATOR CUFF REPAIR Left   . TEMPORARY DIALYSIS CATHETER N/A 05/11/2018   Procedure: TEMPORARY DIALYSIS CATHETER;  Surgeon: Katha Cabal, MD;  Location: [place] [facility];  Service: Cardiovascular;  Laterality: N/A;  . TEMPORARY DIALYSIS CATHETER Left 07/27/2018   Procedure: TEMPORARY DIALYSIS  CATHETER;  Surgeon: Katha Cabal, MD;  Location: [place] [facility];  Service: Cardiovascular;  Laterality: Left;  . TOTAL HIP ARTHROPLASTY Left 09/05/2019   Procedure: TOTAL HIP ARTHROPLASTY;  Surgeon: Corky Mull, MD;  Location: [facility];  Service: Orthopedics;  Laterality: Left;  . TRANSURETHRAL RESECTION OF BLADDER TUMOR N/A 02/17/2016   Procedure: TRANSURETHRAL RESECTION OF BLADDER TUMOR (TURBT)-LARGE;  Surgeon: Hollice Espy, MD;  Location: [facility];  Service: Urology;  Laterality: N/A;  . TRANSURETHRAL RESECTION OF BLADDER TUMOR N/A 10/07/2017   Procedure: TRANSURETHRAL RESECTION OF BLADDER TUMOR (TURBT)-small;  Surgeon: Hollice Espy, MD;  Location: [facility];  Service: Urology;  Laterality: N/A;  . URETEROSCOPY Left 02/17/2016   Procedure: URETEROSCOPY;  Surgeon: Hollice Espy, MD;  Location: [facility];  Service: Urology;  Laterality: Left;  . URETEROSCOPY Right 10/12/2016   Procedure: URETEROSCOPY;  Surgeon: Hollice Espy, MD;  Location: [facility];  Service: Urology;  Laterality: Right;     reports that she quit smoking about 17 years ago. Her smoking use included cigarettes. She has never used smokeless tobacco. She reports that she does not drink alcohol or use drugs.  Allergies  Allergen Reactions  . Glipizide Er Other (See Comments)    Hypoglycemia   . Percocet [Oxycodone-Acetaminophen] Hives    Family History  Problem Relation Age of Onset  . Diabetes Mother   . Cancer Father   . Breast cancer Neg Hx   . Kidney disease Neg Hx      Prior to Admission medications   Medication Sig Start Date End Date Taking? Authorizing Provider  albuterol (PROVENTIL HFA;VENTOLIN HFA) 108 (90 Base) MCG/ACT inhaler Inhale 2 puffs into the lungs every 6 (six) hours as needed for wheezing or shortness of breath.  05/16/18  Yes [provider]  Amino Acids-Protein Hydrolys (FEEDING SUPPLEMENT, PRO-STAT SUGAR FREE 64,) LIQD Place 30 mLs into feeding tube 2 (two) times daily.  09/24/19  Yes Nolberto Hanlon, MD  amLODipine (NORVASC) 5 MG tablet Place 1 tablet (5 mg total) into feeding tube daily. 09/24/19  Yes Nolberto Hanlon, MD  atorvastatin (LIPITOR) 40 MG tablet Place 40 mg into feeding tube at bedtime.  05/01/19  Yes [provider]  B Complex-C (B-COMPLEX WITH VITAMIN C) tablet Place 1 tablet into feeding tube at bedtime. 09/24/19  Yes Nolberto Hanlon, MD  Calcium Acetate 667 MG TABS Place 1,334 mg into feeding tube 3 (three) times daily.   Yes [provider]  calcium carbonate (TUMS - DOSED IN MG ELEMENTAL CALCIUM) 500 MG chewable tablet Place 1 tablet into feeding tube at bedtime.    Yes [provider]  cholecalciferol (VITAMIN D) 1000 units tablet Take 1 tablet (1,000 Units total) by mouth daily. 09/24/19  Yes Nolberto Hanlon, MD  epoetin alfa (EPOGEN) 10000 UNIT/ML injection Inject 1 mL (10,000 Units total) into the vein every Monday, Wednesday, and Friday with hemodialysis. 09/25/19  Yes Nolberto Hanlon, MD  esomeprazole ([place]) 40  MG packet 40 mg daily before breakfast. Per tube   Yes [provider]  ferrous sulfate 300 (60 Fe) MG/5ML syrup Place 324 mg into feeding tube at bedtime.   Yes [provider]  gabapentin (NEURONTIN) 300 MG capsule Place 2 capsules (600 mg total) into feeding tube 3 (three) times daily. Patient taking differently: Place 300 mg into feeding tube 3 (three) times daily.  09/24/19  Yes Nolberto Hanlon, MD  heparin 5000 UNIT/ML injection Inject 1 mL (5,000 Units total) into the skin every 12 (twelve) hours. 09/24/19  Yes Nolberto Hanlon, MD  lacosamide 100 MG TABS Place 1 tablet (100 mg total) into feeding tube 2 (two) times daily. 09/24/19  Yes Nolberto Hanlon, MD  multivitamin (RENA-VIT) TABS tablet Place 1 tablet into feeding tube at bedtime. 09/24/19  Yes Nolberto Hanlon, MD  oxybutynin (DITROPAN) 5 MG tablet Place 1 tablet (5 mg total) into feeding tube 3 (three) times daily. 09/24/19  Yes Nolberto Hanlon, MD  OXYGEN Inhale 2 L into  the lungs at bedtime.   Yes [provider]  Blood Glucose Monitoring Suppl (ACCU-CHEK AVIVA PLUS) w/Device KIT (USE TO MONITOR BLOOD SUGARS.) 12/30/17   [provider]  NON FORMULARY Place 1 Units into the nose at bedtime. CPAP Time of use 2100-0600    [provider]  Water For Irrigation, Sterile (FREE WATER) SOLN Place 50 mLs into feeding tube every 4 (four) hours. Patient not taking: Reported on 09/27/2019 09/24/19   Nolberto Hanlon, MD    Physical Exam: Vitals:   09/27/19 2030 09/27/19 2200 09/27/19 2330 09/28/19 0005  BP:  (!) 108/43 (!) 123/52 (!) 118/51  Pulse: (!) 105 80 78 76  Resp: _0 (!) 24  Temp:      TempSrc:      SpO2: (!) 81% 100% 100% 100%  Weight:      Height:        Constitutional: NAD, calm, comfortable, lethargic chronically ill-appearing female lying flat in bed.  Awoke easily to voice. Vitals:   09/27/19 2030 09/27/19 2200 09/27/19 2330 09/28/19 0005  BP:  (!) 108/43 (!) 123/52 (!) 118/51  Pulse: (!) 105 80 78 76  Resp: _1 (!) 24  Temp:      TempSrc:      SpO2: (!) 81% 100% 100% 100%  Weight:      Height:       Eyes: PERRL, lids and conjunctivae normal ENMT: Mucous membranes are moist.  Poor dentition dentition.  Neck: normal, supple Respiratory: clear to auscultation bilaterally, no wheezing, no crackles. Normal respiratory effort on 2 L via nasal cannula. No accessory muscle use.  Cardiovascular: Regular rate and rhythm, no murmurs / rubs / gallops. No extremity edema.   Abdomen: Mild tenderness around PEG tube placement.  PEG tube opening appears to be necrotic and have drainage.  Bowel sounds positive.  Buttocks: Stage I buttocks and sacral ulcer. Musculoskeletal: no clubbing / cyanosis. No joint deformity upper and lower extremities. Good ROM, no contractures. Normal muscle tone.  Skin: no rashes, lesions, ulcers. No induration Neurologic: CN 2-12 grossly intact. Sensation intact, . Strength 3/5 of the lower  extremity.  Lethargic but alert and oriented x4.  psychiatric: . Alert and oriented x 4. Normal mood.     Labs on Admission: I have personally reviewed following labs and imaging studies  CBC: Recent Labs  Lab 09/22/19 0535 09/27/19 2214  WBC 13.2* 19.7*  HGB 8.1* 4.8*  HCT 26.9* 16.5*  MCV 101.5* 101.9*  PLT 349 341   Basic Metabolic Panel: Recent Labs  Lab 09/22/19 0529 09/23/19 0453 09/24/19 0555 09/27/19 2214  NA 137  --   --  133*  K 3.2* 3.6  --  3.5  CL 97*  --   --  96*  CO2 26  --   --  27  GLUCOSE 131*  --   --  121*  BUN 51*  --   --  39*  CREATININE 7.64*  --   --  4.37*  CALCIUM 8.3*  --   --  8.5*  MG 2.0 2.1 2.1  --   PHOS 6.6* 4.3 4.6  --    GFR: Estimated Creatinine Clearance: 11.1 mL/min (A) (by C-G formula based on SCr of 4.37 mg/dL (H)). Liver Function Tests: Recent Labs  Lab 09/27/19 2214  AST 36  ALT 17  ALKPHOS 56  BILITOT 0.3  PROT 7.5  ALBUMIN 2.5*   Recent Labs  Lab 09/27/19 2214  LIPASE 63*   No results for input(s): AMMONIA in the last 168 hours. Coagulation Profile: Recent Labs  Lab 09/27/19 2214  INR 1.2   Cardiac Enzymes: No results for input(s): CKTOTAL, CKMB, CKMBINDEX, TROPONINI in the last 168 hours. BNP (last 3 results) No results for input(s): PROBNP in the last 8760 hours. HbA1C: No results for input(s): HGBA1C in the last 72 hours. CBG: Recent Labs  Lab 09/23/19 2048 09/24/19 0018 09/24/19 0418 09/24/19 0745 09/24/19 1144  GLUCAP 107* 131* 136* 132* 115*   Lipid Profile: No results for input(s): CHOL, HDL, LDLCALC, TRIG, CHOLHDL, LDLDIRECT in the last 72 hours. Thyroid Function Tests: No results for input(s): TSH, T4TOTAL, FREET4, T3FREE, THYROIDAB in the last 72 hours. Anemia Panel: No results for input(s): VITAMINB12, FOLATE, FERRITIN, TIBC, IRON, RETICCTPCT in the last 72 hours. Urine analysis:    Component Value Date/Time   COLORURINE YELLOW (A) 01/30/2018 2055   APPEARANCEUR Cloudy (A)  02/28/2019 0902   LABSPEC 1.015 01/30/2018 2055   LABSPEC 1.012 08/02/2012 1114   PHURINE 7.0 01/30/2018 2055   GLUCOSEU Negative 02/28/2019 0902   GLUCOSEU Negative 08/02/2012 1114   HGBUR LARGE (A) 01/30/2018 2055   BILIRUBINUR Negative 02/28/2019 0902   BILIRUBINUR Negative 08/02/2012 1114   KETONESUR NEGATIVE 01/30/2018 2055   PROTEINUR 2+ (A) 02/28/2019 0902   PROTEINUR 100 (A) 01/30/2018 2055   NITRITE Negative 02/28/2019 0902   NITRITE NEGATIVE 01/30/2018 2055   LEUKOCYTESUR Trace (A) 02/28/2019 0902   LEUKOCYTESUR Negative 08/02/2012 1114    Radiological Exams on Admission: No results found.   Assessment/Plan  Acute GI bleed Hemoglobin of 4.8 with a baseline of around 8-9. Will do central line placement given difficulty with peripheral line access Transfuse 2 units Continue IV PPI infusion CBC q4hrs Abdominal X-ray to evaluate PEG tube placement  need GI consult in the morning   PEG tube dependency with dysphagia Placed on 09/20/2019 due to dysphagia Suspect could be infected/nonfunctional Will obtain abdominal x-ray to evaluate PEG placement.  Likely will need surgical consult for replacement in the morning  Acute hypoxic respiratory failure Noted by nursing that she chronically is on 2L but this is not noted in last discharge note. Could be due to acute anemia Continue to monitor  Leukocytosis This appears to have been chronic for the past week.  No signs of fever.  Although should she becomes febrile suspected could be GI related  and will start antibiotics at that time.  ESRD on HD  MWF Creatinine is stable Need to monitor for any fluid overload with blood transfusion Need to consult nephrology for HD  Chronic diastolic CHF:  Hemodialysis for fluid management  Type 2 diabetes diet-controlled  Monitor  Hx of seizure will convert to IV Vimpat since there is concerns her PEG tube could be infected and non-functional   Stage 1 sacral and buttock  ulcers  Wound care per RN    DVT prophylaxis: SCD Code Status: Full Family Communication: Plan discussed with patient at bedside. Attempt made with call to spouse but no answer. disposition Plan: Home with at least 2 midnight stays.  Consults called:  Admission status: inpatient with at least 2 midnight stay due to acute GI bleed requiring stepdown unit monitoring and blood transfusion   Horice Carrero T Ladarien Beeks DO [facility]   If 7PM-7AM, please contact night-coverage [email or web address]   09/28/2019, 12:08 AM

## 2019-09-28 NOTE — Progress Notes (Signed)
Spoke with Primary RN, unable to declot R IJ CVC due to being malpositioned. Started a PIV on RAFA with [place]. RN aware.

## 2019-09-28 NOTE — Consult Note (Signed)
[facility] GI Inpatient Consult Note   Barbara [name], M.D.  Reason for Consult: GI bleeding   Attending Requesting Consult: Berle Mull, MD   History of Present Illness: Barbara [name] is a 79 y.o. female presenting for coffee-ground emesis several hours after pulling out her gastrostomy tube.  GI service was called for consultation.   PEG tube was placed endoscopically on 09/20/2019 d/t feeding problem. Patient inadvertently reportedly pulled her PEG tube out yesterday, and presented with coffee ground emesis. She is currently alert in NAD. Non-conversive and confused. Patient husband is at bedside requesting replacement of PEG and assessment of GI bleeding.   Past Medical History:  Past Medical History:  Diagnosis Date  . Adult celiac disease   . Anemia associated with chronic renal failure 2017   blood transfusion last week 10/17  . Arthritis   . Asthma   . 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 Chronic kidney disease   . 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  dr. Candiss Norse and dr. Holley Raring follow her  . COPD (chronic obstructive pulmonary disease) ([place])   . Coronary artery disease   . Diabetes mellitus without complication ([place])   . Dialysis patient ([place])    Tues, Thurs, Sat  . Dyspnea    with exertion  . Elevated lipids   . GERD (gastroesophageal reflux disease)   . Hematuria   . Hypertension   . Lower back pain   . Neuropathy   . Oxygen dependent    at hs  . Personal history of chemotherapy 2017   bladder ca  . Personal history of radiation therapy 2017   bladder ca  . Sleep apnea    uses cpap  . Urinary obstruction 01/2016    Problem List: Patient Active Problem List   Diagnosis Date Noted  . Acute GI bleeding 09/28/2019  . Acute respiratory failure with hypoxia ([place]) 09/28/2019  . Chronic diastolic CHF (congestive heart failure) ([place]) 09/28/2019  . Seizure ([place]) 09/28/2019  . PEG tube malfunction ([place]) 09/28/2019  . Sacral ulcer ([place])  09/23/2019  . Dysphagia 09/19/2019  . Acute encephalopathy   . Goals of care, counseling/discussion   . Aspiration pneumonia ([place]) 09/11/2019  . Status post total hip replacement, left 09/05/2019  . Type II diabetes mellitus with renal manifestations ([place]) 09/05/2019  . ESRD on dialysis ([place]) 09/05/2019  . Bacteremia due to other bacteria   . Bacteroides fragilis infection   . Acute metabolic encephalopathy   . Somnolence   . Lobar pneumonia ([place]) 05/11/2019  . Dialysis patient ([place])   . Primary osteoarthritis of left hip 04/07/2019  . Metabolic acidosis 96/22/2979  . Complication from renal dialysis device 01/28/2018  . History of colonic polyps 07/30/2017  . Presence of cardiac and vascular implant and graft   . Fever   . Sepsis ([place])   . ESRD (end stage renal disease) ([place]) 02/01/2017  . Sepsis secondary to UTI ([place]) 01/24/2017  . Acute kidney injury ([place]) 11/19/2016  . Anemia, chronic renal failure, stage 4 (severe) ([place]) 09/17/2016  . Hypoglycemia 06/04/2016  . Hyperkalemia 05/12/2016  . Protein-calorie malnutrition, severe 05/05/2016  . Acute on chronic renal failure ([place]) 05/04/2016  . Convulsions ([place]) 04/17/2016  . Palliative care by specialist   . DNR (do not resuscitate) discussion   . C. difficile diarrhea 03/11/2016  . Anemia 03/11/2016  . Hypotension 03/11/2016  . Acidosis 03/11/2016  . Failure to thrive (child) 03/11/2016  .  Weakness generalized 03/11/2016  . ARF (acute renal failure) ([place]) 03/04/2016  . 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 Absolute anemia 02/04/2016  . Airway hyperreactivity 02/04/2016  . Celiac disease 02/04/2016  . Gastric catarrh 02/04/2016  . Acid reflux 02/04/2016  . Combined fat and carbohydrate induced hyperlipemia 02/04/2016  . C. difficile colitis 01/22/2016  . Urinary obstruction 01/21/2016  . COPD (chronic obstructive pulmonary disease) ([place]) 01/21/2016  . Controlled type 2 diabetes mellitus with stage 4  chronic kidney disease, with long-term current use of insulin ([place]) 01/21/2016  . Essential hypertension 01/21/2016  . Acute renal failure superimposed on stage 4 chronic kidney disease ([place]) 01/20/2016  . Leukocytosis 01/20/2016  . Anemia in chronic kidney disease 01/20/2016  . Arthritis of knee, degenerative 05/10/2015  . Knee strain 03/26/2015  . Other intervertebral disc displacement, lumbar region 03/04/2015  . Degenerative arthritis of lumbar spine 03/04/2015  . HNP (herniated nucleus pulposus), lumbar 03/04/2015  . Osteoarthritis of spine with radiculopathy, lumbar region 03/04/2015  . Injury of tendon of upper extremity 11/12/2014  . Atherosclerosis of abdominal aorta ([place]) 11/02/2014  . Chronic kidney disease, stage IV (severe) ([place]) 11/02/2014  . Obstructive apnea 11/02/2014  . Complete rotator cuff rupture of left shoulder 10/05/2014  . Arthritis of shoulder region, degenerative 10/05/2014  . CAD in native artery 12/08/2013  . Benign essential HTN 12/08/2013  . Type 2 diabetes mellitus ([place]) 12/08/2013    Past Surgical History: Past Surgical History:  Procedure Laterality Date  . A/V SHUNT INTERVENTION N/A 05/12/2018   Procedure: A/V SHUNT INTERVENTION;  Surgeon: Algernon Huxley, MD;  Location: [place] [facility];  Service: Cardiovascular;  Laterality: N/A;  . A/V SHUNT INTERVENTION Left 07/29/2018   Procedure: A/V SHUNT INTERVENTION;  Surgeon: Katha Cabal, MD;  Location: [facility] [place] [facility];  Service: Cardiovascular;  Laterality: Left;  . ABDOMINAL HYSTERECTOMY    . AV FISTULA PLACEMENT Left 02/17/2017   Procedure: INSERTION OF ARTERIOVENOUS (AV) GORE-TEX GRAFT ARM(BRACHIAL AXILLARY);  Surgeon: Katha Cabal, MD;  Location: [facility];  Service: Vascular;  Laterality: Left;  . CYSTOSCOPY W/ RETROGRADES Bilateral 02/17/2016   Procedure: CYSTOSCOPY WITH RETROGRADE PYELOGRAM;  Surgeon: Hollice Espy, MD;  Location: [facility];  Service: Urology;  Laterality: Bilateral;   . CYSTOSCOPY W/ RETROGRADES Bilateral 10/12/2016   Procedure: CYSTOSCOPY WITH RETROGRADE PYELOGRAM;  Surgeon: Hollice Espy, MD;  Location: [facility];  Service: Urology;  Laterality: Bilateral;  . CYSTOSCOPY W/ RETROGRADES Bilateral 06/09/2017   Procedure: CYSTOSCOPY WITH RETROGRADE PYELOGRAM;  Surgeon: Hollice Espy, MD;  Location: [facility];  Service: Urology;  Laterality: Bilateral;  . CYSTOSCOPY W/ RETROGRADES Bilateral 10/07/2017   Procedure: CYSTOSCOPY WITH RETROGRADE PYELOGRAM;  Surgeon: Hollice Espy, MD;  Location: [facility];  Service: Urology;  Laterality: Bilateral;  . CYSTOSCOPY W/ URETERAL STENT PLACEMENT Left 05/12/2016   Procedure: CYSTOSCOPY WITH STENT REPLACEMENT;  Surgeon: Hollice Espy, MD;  Location: [facility];  Service: Urology;  Laterality: Left;  . CYSTOSCOPY W/ URETERAL STENT PLACEMENT Left 10/12/2016   Procedure: CYSTOSCOPY WITH STENT REPLACEMENT;  Surgeon: Hollice Espy, MD;  Location: [facility];  Service: Urology;  Laterality: Left;  . CYSTOSCOPY WITH BIOPSY N/A 06/09/2017   Procedure: CYSTOSCOPY WITH BLADDER BIOPSY;  Surgeon: Hollice Espy, MD;  Location: [facility];  Service: Urology;  Laterality: N/A;  . CYSTOSCOPY WITH STENT PLACEMENT Left 01/21/2016   Procedure: CYSTOSCOPY WITH double J STENT PLACEMENT;  Surgeon: Franchot Gallo, MD;  Location: [facility];  Service: Urology;  Laterality:  Left;  . CYSTOSCOPY WITH STENT PLACEMENT Right 10/12/2016   Procedure: CYSTOSCOPY WITH STENT PLACEMENT;  Surgeon: Hollice Espy, MD;  Location: [facility];  Service: Urology;  Laterality: Right;  . DIALYSIS/PERMA CATHETER INSERTION N/A 11/20/2016   Procedure: Dialysis/Perma Catheter Insertion;  Surgeon: Algernon Huxley, MD;  Location: [facility] [facility];  Service: Cardiovascular;  Laterality: N/A;  . DIALYSIS/PERMA CATHETER REMOVAL N/A 03/30/2017   Procedure: DIALYSIS/PERMA CATHETER REMOVAL;  Surgeon: Katha Cabal, MD;  Location: [facility];  Service: Cardiovascular;   Laterality: N/A;  . KIDNEY SURGERY  01/21/2016   IR NEPHROSTOMY PLACEMENT LEFT   . PEG PLACEMENT N/A 09/20/2019   Procedure: PERCUTANEOUS ENDOSCOPIC GASTROSTOMY (PEG) PLACEMENT;  Surgeon: Aoi Kouns, Benay Pike, MD;  Location: [facility];  Service: Gastroenterology;  Laterality: N/A;  . PERIPHERAL VASCULAR CATHETERIZATION N/A 04/07/2016   Procedure: Glori Luis Cath Insertion;  Surgeon: Katha Cabal, MD;  Location: [facility];  Service: Cardiovascular;  Laterality: N/A;  . PERIPHERAL VASCULAR THROMBECTOMY Left 06/02/2017   Procedure: PERIPHERAL VASCULAR THROMBECTOMY;  Surgeon: Algernon Huxley, MD;  Location: [place] [facility];  Service: Cardiovascular;  Laterality: Left;  . PERIPHERAL VASCULAR THROMBECTOMY Left 01/28/2018   Procedure: PERIPHERAL VASCULAR THROMBECTOMY;  Surgeon: Katha Cabal, MD;  Location: [facility];  Service: Cardiovascular;  Laterality: Left;  . PERIPHERAL VASCULAR THROMBECTOMY Left 03/14/2018   Procedure: PERIPHERAL VASCULAR THROMBECTOMY;  Surgeon: Algernon Huxley, MD;  Location: [place];  Service: Cardiovascular;  Laterality: Left;  . PERIPHERAL VASCULAR THROMBECTOMY Left 03/16/2018   Procedure: PERIPHERAL VASCULAR THROMBECTOMY;  Surgeon: Katha Cabal, MD;  Location: [place] [facility];  Service: Cardiovascular;  Laterality: Left;  . PORTA CATH REMOVAL N/A 02/07/2019   Procedure: PORTA CATH REMOVAL;  Surgeon: Katha Cabal, MD;  Location: [place] [facility];  Service: Cardiovascular;  Laterality: N/A;  . PORTACATH PLACEMENT Right   . ROTATOR CUFF REPAIR Left   . TEMPORARY DIALYSIS CATHETER N/A 05/11/2018   Procedure: TEMPORARY DIALYSIS CATHETER;  Surgeon: Katha Cabal, MD;  Location: [facility];  Service: Cardiovascular;  Laterality: N/A;  . TEMPORARY DIALYSIS CATHETER Left 07/27/2018   Procedure: TEMPORARY DIALYSIS CATHETER;  Surgeon: Katha Cabal, MD;  Location: [facility];  Service: Cardiovascular;   Laterality: Left;  . TOTAL HIP ARTHROPLASTY Left 09/05/2019   Procedure: TOTAL HIP ARTHROPLASTY;  Surgeon: Corky Mull, MD;  Location: [facility];  Service: Orthopedics;  Laterality: Left;  . TRANSURETHRAL RESECTION OF BLADDER TUMOR N/A 02/17/2016   Procedure: TRANSURETHRAL RESECTION OF BLADDER TUMOR (TURBT)-LARGE;  Surgeon: Hollice Espy, MD;  Location: [facility];  Service: Urology;  Laterality: N/A;  . TRANSURETHRAL RESECTION OF BLADDER TUMOR N/A 10/07/2017   Procedure: TRANSURETHRAL RESECTION OF BLADDER TUMOR (TURBT)-small;  Surgeon: Hollice Espy, MD;  Location: [facility];  Service: Urology;  Laterality: N/A;  . URETEROSCOPY Left 02/17/2016   Procedure: URETEROSCOPY;  Surgeon: Hollice Espy, MD;  Location: [facility];  Service: Urology;  Laterality: Left;  . URETEROSCOPY Right 10/12/2016   Procedure: URETEROSCOPY;  Surgeon: Hollice Espy, MD;  Location: [facility];  Service: Urology;  Laterality: Right;    Allergies: Allergies  Allergen Reactions  . Glipizide Er Other (See Comments)    Hypoglycemia   . Percocet [Oxycodone-Acetaminophen] Hives    Home Medications: Medications Prior to Admission  Medication Sig Dispense Refill Last Dose  . albuterol (PROVENTIL HFA;VENTOLIN HFA) 108 (90 Base) MCG/ACT inhaler Inhale 2 puffs into the lungs  every 6 (six) hours as needed for wheezing or shortness of breath.    prn at prn  . Amino Acids-Protein Hydrolys (FEEDING SUPPLEMENT, PRO-STAT SUGAR FREE 64,) LIQD Place 30 mLs into feeding tube 2 (two) times daily. 887 mL 0 unknown at unknown  . amLODipine (NORVASC) 5 MG tablet Place 1 tablet (5 mg total) into feeding tube daily.   unknown at unknown  . atorvastatin (LIPITOR) 40 MG tablet Place 40 mg into feeding tube at bedtime.    unknown at unknown  . B Complex-C (B-COMPLEX WITH VITAMIN C) tablet Place 1 tablet into feeding tube at bedtime.   unknown at unknown  . Calcium Acetate 667 MG TABS Place 1,334 mg into feeding tube 3 (three) times daily.   unknown  at unknown  . calcium carbonate (TUMS - DOSED IN MG ELEMENTAL CALCIUM) 500 MG chewable tablet Place 1 tablet into feeding tube at bedtime.    unknown at unknown  . cholecalciferol (VITAMIN D) 1000 units tablet Take 1 tablet (1,000 Units total) by mouth daily.   unknown at unknown  . epoetin alfa (EPOGEN) 10000 UNIT/ML injection Inject 1 mL (10,000 Units total) into the vein every Monday, Wednesday, and Friday with hemodialysis. 1 mL 0 unknown at unknown  . esomeprazole (NEXIUM) 40 MG packet 40 mg daily before breakfast. Per tube   unknown at unknown  . ferrous sulfate 300 (60 Fe) MG/5ML syrup Place 324 mg into feeding tube at bedtime.   unknown at unknown  . gabapentin (NEURONTIN) 300 MG capsule Place 2 capsules (600 mg total) into feeding tube 3 (three) times daily. (Patient taking differently: Place 300 mg into feeding tube 3 (three) times daily. )   unknown at unknown  . heparin 5000 UNIT/ML injection Inject 1 mL (5,000 Units total) into the skin every 12 (twelve) hours. 1 mL 0 unknown at unknown  . lacosamide 100 MG TABS Place 1 tablet (100 mg total) into feeding tube 2 (two) times daily. 60 tablet  unknown at unknown  . multivitamin (RENA-VIT) TABS tablet Place 1 tablet into feeding tube at bedtime.  0 unknown at unknown  . oxybutynin (DITROPAN) 5 MG tablet Place 1 tablet (5 mg total) into feeding tube 3 (three) times daily.   unknown at unknown  . OXYGEN Inhale 2 L into the lungs at bedtime.   unknown at unknown  . Blood Glucose Monitoring Suppl (ACCU-CHEK AVIVA PLUS) w/Device KIT (USE TO MONITOR BLOOD SUGARS.)  0   . NON FORMULARY Place 1 Units into the nose at bedtime. CPAP Time of use 2100-0600     . Water For Irrigation, Sterile (FREE WATER) SOLN Place 50 mLs into feeding tube every 4 (four) hours. (Patient not taking: Reported on 09/27/2019)   Not Taking at Unknown time   Home medication reconciliation was completed with the patient.   Scheduled Inpatient Medications:    Continuous  Inpatient Infusions:   . sodium chloride    . sodium chloride    . lacosamide (VIMPAT) IV 100 mg (09/28/19 1038)  . pantoprozole (PROTONIX) infusion 8 mg/hr (09/28/19 0306)    PRN Inpatient Medications:    Family History: family history includes Cancer in her father; Diabetes in her mother.   GI Family History: Negative  Social History:   reports that she quit smoking about 17 years ago. Her smoking use included cigarettes. She has never used smokeless tobacco. She reports that she does not drink alcohol or use drugs. The patient denies ETOH, tobacco, or drug  use.    Review of Systems: Review of Systems - Unobtainable due to patient confusion.  Physical Examination: BP (!) 133/47   Pulse 81   Temp 98 F (36.7 C) (Oral)   Resp 18   Ht 5' 6" (1.676 m)   Wt 76.1 kg   SpO2 97%   BMI 27.08 kg/m  Physical Exam Constitutional:      Appearance: She is obese. She is not toxic-appearing or diaphoretic.  HENT:     Head: Normocephalic and atraumatic.     Nose: Nose normal.  Eyes:     Extraocular Movements: Extraocular movements intact.     Pupils: Pupils are equal, round, and reactive to light.  Cardiovascular:     Rate and Rhythm: Normal rate and regular rhythm.     Heart sounds: No murmur.  Pulmonary:     Effort: Pulmonary effort is normal.  Abdominal:     General: There is no distension.     Palpations: Abdomen is soft.     Tenderness: There is no abdominal tenderness.  Neurological:     Mental Status: She is alert.     Data: Lab Results  Component Value Date   WBC 15.2 (H) 09/28/2019   HGB 6.1 (L) 09/28/2019   HCT 19.9 (L) 09/28/2019   MCV 96.1 09/28/2019   PLT 239 09/28/2019   Recent Labs  Lab 09/22/19 0535 09/27/19 2214 09/28/19 0636  HGB 8.1* 4.8* 6.1*   Lab Results  Component Value Date   NA 133 (L) 09/28/2019   K 3.5 09/28/2019   CL 97 (L) 09/28/2019   CO2 27 09/28/2019   BUN 43 (H) 09/28/2019   CREATININE 4.99 (H) 09/28/2019   Lab Results   Component Value Date   ALT 17 09/27/2019   AST 36 09/27/2019   ALKPHOS 56 09/27/2019   BILITOT 0.3 09/27/2019   Recent Labs  Lab 09/27/19 2214  APTT 35  INR 1.2   CBC Latest Ref Rng & Units 09/28/2019 09/27/2019 09/22/2019  WBC 4.0 - 10.5 K/uL 15.2(H) 19.7(H) 13.2(H)  Hemoglobin 12.0 - 15.0 g/dL 6.1(L) 4.8(LL) 8.1(L)  Hematocrit 36.0 - 46.0 % 19.9(L) 16.5(L) 26.9(L)  Platelets 150 - 400 K/uL 239 246 349    STUDIES: DG ABDOMEN PEG TUBE LOCATION  Result Date: 09/28/2019 CLINICAL DATA:  Peg tube location. EXAM: ABDOMEN - 1 VIEW COMPARISON:  09/13/2019 FINDINGS: A gastrostomy tube projects over the gastric bubble. The bowel gas pattern is nonobstructive. Extensive phleboliths project over the patient's pelvis. The patient is status post prior total hip arthroplasty on the left. IMPRESSION: Gastrostomy tube projects over the stomach. Electronically Signed   By: Constance Holster M.D.   On: 09/28/2019 01:41   DG Chest Port 1 View  Result Date: 09/28/2019 CLINICAL DATA:  Central line placement EXAM: PORTABLE CHEST 1 VIEW COMPARISON:  September 22, 2019 FINDINGS: There is a right-sided central venous catheter with tip projecting over the right lung apex. This may be in the region of the distal right internal jugular vein. Exact positioning is difficult to determine on this study. A stent projects over the patient's upper mediastinum. The heart size is stable. There is stable blunting of the left costophrenic angle. There is no pneumothorax. IMPRESSION: 1. Right-sided central venous catheter as above.  No pneumothorax. 2. Relatively stable appearance of the chest when compared to prior x-ray. Electronically Signed   By: Constance Holster M.D.   On: 09/28/2019 01:40   _0 @  Assessment:  1. UGI bleed -  likely secondary to local trauma from self removal of PEG tube.  2. Anemia secondary to GI blood loss.  3. Feeding problem.   4. ESRD on hemodialysis.  COVID-19 status: Tested negative.    Recommendations:  1. IV PPI as you are doing.  2. EGD with possible injection/cautery hemostasis. I discussed the procedure with the patient's husband who understands the nature of the planned procedure, indications, risks, alternatives and potential complications including but not limited to bleeding, infection, perforation, damage to internal organs and possible oversedation/side effects from anesthesia. The patient's husband agrees and gives consent to proceed.  Please refer to procedure notes for findings, recommendations and patient disposition/instructions.   Thank you for the consult. Please call with questions or concerns.  Olean Ree, "Lanny Hurst MD [facility] [place], [place] [address] [phone]  09/28/2019 11:18 AM

## 2019-09-28 NOTE — Op Note (Signed)
[facility] Gastroenterology Patient Name: Barbara [name] Procedure Date: 09/28/2019 12:23 PM MRN: [id number] Account #: [id number] Date of Birth: [birth year] Admit Type: Inpatient Age: 79 Room: [facility] ROOM 1 Gender: Female Note Status: Finalized Procedure:             Upper GI endoscopy Indications:           Acute post hemorrhagic anemia, Hematemesis, Replace                         PEG tube because existing gastrostomy tube came out Providers:             Benay Pike. Alice Reichert MD, MD Referring MD:          No Local Md, MD (Referring MD) Medicines:             Propofol per Anesthesia Complications:         No immediate complications. Procedure:             Pre-Anesthesia Assessment:                        - The risks and benefits of the procedure and the                         sedation options and risks were discussed with the                         patient. All questions were answered and informed                         consent was obtained.                        - Patient identification and proposed procedure were                         verified prior to the procedure by the nurse. The                         procedure was verified in the procedure room.                        - ASA Grade Assessment: III - A patient with severe                         systemic disease.                        - After reviewing the risks and benefits, the patient                         was deemed in satisfactory condition to undergo the                         procedure.                        - The anesthesia plan was to use general anesthesia.  After obtaining informed consent, the endoscope was                         passed under direct vision. Throughout the procedure,                         the patient's blood pressure, pulse, and oxygen                         saturations were monitored continuously. The Endoscope                         was  introduced through the mouth, and advanced to the                         third part of duodenum. The upper GI endoscopy was                         accomplished without difficulty. The patient tolerated                         the procedure well. Findings:      The examined esophagus was normal.      One non-bleeding superficial gastric ulcer with no bleeding, surrounding       the internal bumper of the foley catheter was found in the gastric body.       The lesion was 7 mm in largest dimension. The foley catheter preserving       the gastrostomy tube tract was deflated and removed. The foley catheter       was discarded. The 20 Fr Microvasive replacement Gastrostomy tube was       inserted through the external wound and advanced and inflated. Correct       placement of the tube was confirmed endoscopically. The external bumper       of the replacement PEG was noted at 4cm. The PEG was cleaned and       dressed. Coffee ground material was present in the fundus but no active       bleeding was noted from the Post PEG ulcer.      The examined duodenum was normal.      The exam was otherwise without abnormality. Impression:            - Normal esophagus.                        - Non-bleeding gastric ulcer with no bleeding,                         surrounding the internal bumper of the foley catheter.                        - Normal examined duodenum.                        - The examination was otherwise normal.                        - No specimens collected. Recommendation:        - Return patient to hospital ward for possible  discharge same day.                        - Resume previous diet.                        - Abdominal binder placement                        - Pantoprazole 64m per PEG daily for at least 3                         months. Procedure Code(s):     --- Professional ---                        [id number] Esophagogastroduodenoscopy, flexible,                          transoral; diagnostic, including collection of                         specimen(s) by brushing or washing, when performed                         (separate procedure) Diagnosis Code(s):     --- Professional ---                        KF42.39 Gastrostomy malfunction                        K92.0, Hematemesis                        D62, Acute posthemorrhagic anemia CPT copyright 2019 [employer]. All rights reserved. The codes documented in this report are preliminary and upon coder review may  be revised to meet current compliance requirements. TEfrain SellaMD, MD 09/28/2019 4:46:34 PM This report has been signed electronically. Number of Addenda: 0 Note Initiated On: 09/28/2019 12:23 PM Estimated Blood Loss:  Estimated blood loss: none.      [facility]

## 2019-09-29 ENCOUNTER — Encounter: Payer: Self-pay | Admitting: *Deleted

## 2019-09-29 LAB — GLUCOSE, CAPILLARY
Glucose-Capillary: 101 mg/dL — ABNORMAL HIGH (ref 70–99)
Glucose-Capillary: 102 mg/dL — ABNORMAL HIGH (ref 70–99)
Glucose-Capillary: 113 mg/dL — ABNORMAL HIGH (ref 70–99)
Glucose-Capillary: 70 mg/dL (ref 70–99)
Glucose-Capillary: 83 mg/dL (ref 70–99)
Glucose-Capillary: 85 mg/dL (ref 70–99)
Glucose-Capillary: 91 mg/dL (ref 70–99)

## 2019-09-29 LAB — BASIC METABOLIC PANEL
Anion gap: 13 (ref 5–15)
BUN: 53 mg/dL — ABNORMAL HIGH (ref 8–23)
CO2: 21 mmol/L — ABNORMAL LOW (ref 22–32)
Calcium: 8.5 mg/dL — ABNORMAL LOW (ref 8.9–10.3)
Chloride: 99 mmol/L (ref 98–111)
Creatinine, Ser: 6.85 mg/dL — ABNORMAL HIGH (ref 0.44–1.00)
GFR calc Af Amer: 6 mL/min — ABNORMAL LOW (ref >60)
GFR calc non Af Amer: 5 mL/min — ABNORMAL LOW (ref >60)
Glucose, Bld: 119 mg/dL — ABNORMAL HIGH (ref 70–99)
Potassium: 4.5 mmol/L (ref 3.5–5.1)
Sodium: 133 mmol/L — ABNORMAL LOW (ref 135–145)

## 2019-09-29 LAB — MAGNESIUM: Magnesium: 1.9 mg/dL (ref 1.7–2.4)

## 2019-09-29 LAB — CBC
HCT: 22 % — ABNORMAL LOW (ref 36.0–46.0)
Hemoglobin: 6.7 g/dL — ABNORMAL LOW (ref 12.0–15.0)
MCH: 27.8 pg (ref 26.0–34.0)
MCHC: 30.5 g/dL (ref 30.0–36.0)
MCV: 91.3 fL (ref 80.0–100.0)
Platelets: 298 10*3/uL (ref 150–400)
RBC: 2.41 MIL/uL — ABNORMAL LOW (ref 3.87–5.11)
RDW: 20 % — ABNORMAL HIGH (ref 11.5–15.5)
WBC: 11.6 10*3/uL — ABNORMAL HIGH (ref 4.0–10.5)
nRBC: 0 % (ref 0.0–0.2)

## 2019-09-29 LAB — PREPARE RBC (CROSSMATCH)

## 2019-09-29 MED ORDER — EPOETIN ALFA 10000 UNIT/ML IJ SOLN
10000.0000 [IU] | INTRAMUSCULAR | Status: DC
  Administered 2019-09-29 – 2019-10-04 (×3): 10000 [IU] via INTRAVENOUS
  Filled 2019-09-29 (×3): qty 1

## 2019-09-29 MED ORDER — B COMPLEX-C PO TABS
1.0000 | ORAL_TABLET | Freq: Every day | ORAL | Status: DC
  Administered 2019-09-29 – 2019-10-09 (×11): 1
  Filled 2019-09-29 (×12): qty 1

## 2019-09-29 MED ORDER — FENTANYL CITRATE (PF) 100 MCG/2ML IJ SOLN
12.5000 ug | Freq: Once | INTRAMUSCULAR | Status: AC
  Administered 2019-09-29: 12.5 ug via INTRAVENOUS
  Filled 2019-09-29: qty 2

## 2019-09-29 MED ORDER — SODIUM CHLORIDE 0.9% IV SOLUTION: Freq: Once | INTRAVENOUS | Status: DC

## 2019-09-29 MED ORDER — FREE WATER
50.0000 mL | Freq: Every day | Status: DC
  Administered 2019-09-29 – 2019-10-10 (×64): 50 mL

## 2019-09-29 MED ORDER — NEPRO/CARBSTEADY PO LIQD
1000.0000 mL | ORAL | Status: DC
  Administered 2019-09-29 – 2019-10-09 (×12): 1000 mL

## 2019-09-29 NOTE — Progress Notes (Signed)
[facility] Progress Note  Patient: Barbara [name]    TUU:[id number]  DOA: 09/27/2019     Date of Service: the patient was seen and examined on 09/29/2019  Chief Complaint  Patient presents with  . Hematemesis   Brief hospital course: Barbara [name] is a 79 y.o. female with medical history significant for ESRD on HD MWF, hyperlipidemia, type 2 diabetes, COPD, asthma, GERD, bladder cancer, and iron deficiency anemia who presents for concerns of acute GI bleed and anemia.  SP EGD and replacement of the G-tube. Currently further plan is continue to monitor H&H.  Assessment and Plan: Acute GI bleed Hemoglobin of 4.8 with a baseline of around 8-9. Needed central line placement given difficulty with peripheral line access Transfused 2 units, hemoglobin low again on 09/29/2019 SP 1 more PRBC transfusion. Continue IV PPI TWICE daily Appreciate GI assistance.  SP EGD.  PEG tube dependency with dysphagia Placed on 09/20/2019 due to dysphagia Appreciate GI assistance for replacement of the G-tube. GI recommended to resume diet. Currently tolerating well.  Acute hypoxic respiratory failure Noted by nursing that she chronically is on 2L but this is not noted in last discharge note. Could be due to acute anemia Currently improving. Continue to monitor  Leukocytosis This appears to have been chronic for the past week.  No signs of fever. Although should she becomes febrile suspected could be GI related  and will start antibiotics at that time.  ESRD on HD MWF Creatinine is stable Need to monitor for any fluid overload with blood transfusion Appreciate nephrology assistance. Currently volume stable.  Chronic diastolic CHF:  Hemodialysis for fluid management  Type 2 diabetes controlled. diet-controlled  Currently on sliding scale insulin. Also on Nepro for tube feeding. Monitor  Hx of seizure Continue home regimen per tube.  Stage 1 sacral and buttock ulcers  Wound care per  RN   Dysphagia. Patient is significantly awake and alert. Speech therapy consulted for assessment for the diet. Per speech therapy patient was not eating enough likely from not being hungry while she was on tube feeding. If the patient is able to advance her diet will discuss with RD to see if he can reduce the dose of the tube feeding to allow for more p.o. nutrition.  Body mass index is 27.54 kg/m.  Nutrition Problem: Inadequate oral intake Etiology: dysphagia Interventions: Interventions: Tube feeding, Prostat, MVI  Pressure Injury 09/21/19 Buttocks Right Stage 2 -  Partial thickness loss of dermis presenting as a shallow open injury with a red, pink wound bed without slough. 0.5cmx0.5cm (Active)  09/21/19 0830  Location: Buttocks  Location Orientation: Right  Staging: Stage 2 -  Partial thickness loss of dermis presenting as a shallow open injury with a red, pink wound bed without slough.  Wound Description (Comments): 0.5cmx0.5cm  Present on Admission: No     Diet: Currently n.p.o.  On tube feeding. DVT Prophylaxis: SCD, pharmacological prophylaxis contraindicated due to GI bleed   Advance goals of care discussion: Full code  Family Communication: family was present at bedside, at the time of interview.  The pt provided permission to discuss medical plan with the family. Opportunity was given to ask question and all questions were answered satisfactorily.   Disposition:  Pt is from SNF, admitted with upper GI bleed from G-tube malfunction, still has anemia requiring blood transfusion, which precludes a safe discharge. Discharge to SNF, when medically stable likely Monday.  Subjective: Alert and awake.  Denies any acute complaints.  No nausea no vomiting but no fever no chills.  No bleeding anywhere reported by the patient.  Tolerating tube feedings well.  Physical Exam: General:  alert oriented to time, place, and person.  Appear in mild distress, affect  appropriate Eyes: PERRL ENT: Oral Mucosa Clear, moist  Neck: no JVD,  Cardiovascular: S1 and S2 Present, no Murmur,  Respiratory: good respiratory effort, Bilateral Air entry equal and Decreased, no Crackles, no wheezes Abdomen: Bowel Sound present, Soft and no tenderness,  Skin: no rash Extremities: no Pedal edema, no calf tenderness Neurologic: without any new focal findings  Gait not checked due to patient safety concerns  Vitals:   09/29/19 1100 09/29/19 1115 09/29/19 1124 09/29/19 1130  BP: (!) 130/58 134/68 (!) 138/56 140/68  Pulse: 81 77 78 80  Resp: 17 17 (!) 28 (!) 21  Temp:   97.8 F (36.6 C) 97.8 F (36.6 C)  TempSrc:      SpO2:   100% 100%  Weight:      Height:        Intake/Output Summary (Last 24 hours) at 09/29/2019 1916 Last data filed at 09/29/2019 1629 Gross per 24 hour  Intake 692.5 ml  Output 0 ml  Net 692.5 ml   Filed Weights   09/29/19 0455 09/29/19 0745 09/29/19 0850  Weight: 77.5 kg 77.4 kg 77.4 kg    Data Reviewed: I have personally reviewed and interpreted daily labs, tele strips, imagings as discussed above. I reviewed all nursing notes, pharmacy notes, vitals, pertinent old records I have discussed plan of care as described above with RN and patient/family.  CBC: Recent Labs  Lab 09/27/19 2214 09/28/19 0636 09/28/19 1404 09/29/19 0501  WBC 19.7* 15.2* 16.8* 11.6*  HGB 4.8* 6.1* 7.5* 6.7*  HCT 16.5* 19.9* 23.1* 22.0*  MCV 101.9* 96.1 88.2 91.3  PLT 246 239 236 944   Basic Metabolic Panel: Recent Labs  Lab 09/23/19 0453 09/24/19 0555 09/27/19 2214 09/28/19 0636 09/29/19 0501  NA  --   --  133* 133* 133*  K 3.6  --  3.5 3.5 4.5  CL  --   --  96* 97* 99  CO2  --   --  27 27 21*  GLUCOSE  --   --  121* 99 119*  BUN  --   --  39* 43* 53*  CREATININE  --   --  4.37* 4.99* 6.85*  CALCIUM  --   --  8.5* 8.4* 8.5*  MG 2.1 2.1  --   --  1.9  PHOS 4.3 4.6  --   --   --     Studies: No results found.  Scheduled Meds: . sodium  chloride   Intravenous Once  . B-complex with vitamin C  1 tablet Per Tube QHS  . epoetin (EPOGEN/PROCRIT) injection  10,000 Units Intravenous Q M,W,F-HD  . feeding supplement (NEPRO CARB STEADY)  1,000 mL Per Tube Q24H  . feeding supplement (PRO-STAT SUGAR FREE 64)  30 mL Per Tube BID  . free water  50 mL Per Tube 6 X Daily  . gabapentin  300 mg Per Tube TID  . lacosamide  100 mg Per Tube BID  . pantoprazole (PROTONIX) IV  40 mg Intravenous Q12H   Continuous Infusions: . sodium chloride     PRN Meds: albuterol  Time spent: 35 minutes  Author: Berle Mull, MD [facility] 09/29/2019 7:16 PM  To reach On-call, see care teams to locate the attending and reach out  to them via [email or web address]. If 7PM-7AM, please contact night-coverage If you still have difficulty reaching the attending provider, please page the [facility] (Director on Call) for [facility] on amion for assistance.

## 2019-09-29 NOTE — Progress Notes (Signed)
HD Initiated    09/29/19 0805  Vital Signs  Temp (!) 97.4 F (36.3 C)  Temp Source Axillary  Pulse Rate 75  Pulse Rate Source Dinamap  Resp 20  BP (!) 115/46  BP Location Right Arm  BP Method Automatic  Patient Position (if appropriate) Lying  Oxygen Therapy  SpO2 100 %  O2 Device Nasal Cannula  O2 Flow Rate (L/min) 2 L/min  During Hemodialysis Assessment  Blood Flow Rate (mL/min) 300 mL/min  Arterial Pressure (mmHg) -30 mmHg  Venous Pressure (mmHg) 180 mmHg  Transmembrane Pressure (mmHg) 50 mmHg  Ultrafiltration Rate (mL/min) 170 mL/min  Dialysate Flow Rate (mL/min) 600 ml/min  Conductivity: Machine  14  HD Safety Checks Performed Yes  Dialysis Fluid Bolus Normal Saline  Bolus Amount (mL) 250 mL  Intra-Hemodialysis Comments Tx initiated

## 2019-09-29 NOTE — Progress Notes (Signed)
Initial Nutrition Assessment  DOCUMENTATION CODES:   Not applicable  INTERVENTION:  Provide Nepro at 60 mL/hr x 16 hrs (1600-0800). This is 960 mL goal daily volume. Also provide Pro-Stat 30 mL BID per tube. Goal regiment provides 1928 kcal, 108 grams of protein, 701 mL H2O daily.  Flush tube with 50 mL Q3hrs while tube feeds are running from 1600-0800. This will provide 300 mL additional water for a total of approximately 1001 mL daily not including water patient receives with medications.  Provide B-complex with C QHS per tube.  NUTRITION DIAGNOSIS:   Inadequate oral intake related to dysphagia as evidenced by (reliance on tube feed regimen per PEG tube to meet calorie/protein/hydration needs.).  GOAL:   Patient will meet greater than or equal to 90% of their needs  MONITOR:   Labs, Weight trends, TF tolerance, Skin, I & O's  REASON FOR ASSESSMENT:   Consult Enteral/tube feeding initiation and management  ASSESSMENT:   79 year old female with PMHx of COPD, HTN, arthritis, GERD, CAD, asthma, ESRD on HD, celiac disease, DM, sleep apnea, bladder cancer s/p chemo/XRT, recent left total hip arthroplasty on 09/05/2019, dysphagia s/p PEG tube placement on 09/20/2019 who is admitted from [facility] after pulling out PEG tube and with coffee-ground emesis. Patient s/p replacement of PEG tube on 3/11.   Met with patient and her husband at bedside after patient returned from HD. Patient is unable to provide information on tube feed regimen at [facility]. Reviewed paper chart from facility. Patient was switched to nocturnal feeds of Nepro at 60 mL/hr x 16 hrs (1300-0500). She also receives free water flush of 30 mL every hr x 16 hrs (480 mL free water).  Patient is unsure about weight trend. Patient was 81.6 kg on 02/28/2019. She is now 77.4 kg (170.64 lbs). She has lost 4.2 kg (5.2% body weight) over the past 7 months, which is not significant for time frame.  Medications reviewed  and include: Epogen 1000 units during HD, gabapentin, Vimpat, pantoprazole.  Labs reviewed: CBG 91-102, Sodium 133, CO2 21, BUN 53, Creatinine 6.85.  Enteral Access: PEG tube replaced on 09/28/2019  Discussed with RN.  NUTRITION - FOCUSED PHYSICAL EXAM:    Most Recent Value  Orbital Region  No depletion  Upper Arm Region  Mild depletion  Thoracic and Lumbar Region  No depletion  Buccal Region  No depletion  Temple Region  Mild depletion  Clavicle Bone Region  Mild depletion  Clavicle and Acromion Bone Region  No depletion  Scapular Bone Region  No depletion  Dorsal Hand  Mild depletion  Patellar Region  Moderate depletion  Anterior Thigh Region  Moderate depletion  Posterior Calf Region  Moderate depletion  Edema (RD Assessment)  Mild  Hair  Reviewed  Eyes  Reviewed  Mouth  Reviewed  Skin  Reviewed  Nails  Reviewed     Diet Order:   Diet Order            Diet NPO time specified  Diet effective now             EDUCATION NEEDS:   No education needs have been identified at this time  Skin:  Skin Assessment: Skin Integrity Issues:(stg II buttocks (0.5cm x 0.5cm))  Last BM:  09/28/2019 small type 6  Height:   Ht Readings from Last 1 Encounters:  09/28/19 5' 6"  (1.676 m)   Weight:   Wt Readings from Last 1 Encounters:  09/29/19 77.4 kg  Ideal Body Weight:  59.1 kg  BMI:  Body mass index is 27.54 kg/m.  Estimated Nutritional Needs:   Kcal:  1700-1900  Protein:  95-110 grams  Fluid:  UOP + 1 L  Jacklynn Barnacle, MS, RD, LDN Pager number available on Amion

## 2019-09-29 NOTE — Progress Notes (Signed)
PT Cancellation Note  Patient Details Name: Barbara [name] MRN: [id number] DOB: 06-Nov-1940   Cancelled Treatment:    Reason Eval/Treat Not Completed: Medical issues which prohibited therapy(Chart reviewed: hb remains in 6s, still outside of appropriiate range for PT evaluation. Will follow acutely, resume services when appropriate.)  2:27 PM, 09/29/19 Etta Grandchild, PT, DPT Physical Therapist - [facility]  [phone] ([place])     [place] 09/29/2019, 2:27 PM

## 2019-09-29 NOTE — TOC Initial Note (Signed)
Transition of Care [facility]) - Initial/Assessment Note    Patient Details  Name: Barbara [name] MRN: [id number] Date of Birth: 1941-02-20  [facility]) CM/SW Contact:    [name], RN Phone Number: 09/29/2019, 2:07 PM  Clinical Narrative:                 Patient admitted for GI bleed  Patient was off the floor for HD.  Husband was in the room, and I completed the assessment with him  Patient was admitted from [facility]. Prior to being at [facility] patient lived at home with her husband, has a wheelchair ramp, wheelchair, RW, and cane.    RNCM confirmed with Otila Kluver at [facility] that patient can return at discharge.  Otila Kluver states that family did not do a bed hold but they would be able to accept patient back at discharge.   Requested PT eval.  MD notified that prior to returning to facility patient would have to insurance approval, and a covid test within 4 days.   RNCM asked husband if the plan was for patient to return to [facility], he states he wants to see how she does with PT first then "she has to have a say if she wants to go back"  Expected Discharge Plan: Skilled Nursing Facility Barriers to Discharge: Continued Medical Work up   Patient Goals and CMS Choice        Expected Discharge Plan and Services Expected Discharge Plan: [place] arrangements for the past 2 months: [place]                                      Prior Living Arrangements/Services Living arrangements for the past 2 months: [place] Lives with:: Facility Resident          Need for Family Participation in Patient Care: Yes (Comment) Care giver support system in place?: Yes (comment)   Criminal Activity/Legal Involvement Pertinent to Current Situation/Hospitalization: No - Comment as needed  Activities of Daily Living      Permission Sought/Granted                  Emotional Assessment               Admission diagnosis:  Acute GI bleeding [K92.2] Encounter for central line placement [Z45.2] PEG tube malfunction [facility]) [S34.19] Patient Active Problem List   Diagnosis Date Noted  . Acute GI bleeding 09/28/2019  . Acute respiratory failure with hypoxia ([place]) 09/28/2019  . Chronic diastolic CHF (congestive heart failure) ([place]) 09/28/2019  . Seizure ([place]) 09/28/2019  . PEG tube malfunction ([place]) 09/28/2019  . Sacral ulcer ([place]) 09/23/2019  . Dysphagia 09/19/2019  . Acute encephalopathy   . Goals of care, counseling/discussion   . Aspiration pneumonia ([place]) 09/11/2019  . Status post total hip replacement, left 09/05/2019  . Type II diabetes mellitus with renal manifestations ([place]) 09/05/2019  . ESRD on dialysis ([place]) 09/05/2019  . Bacteremia due to other bacteria   . Bacteroides fragilis infection   . Acute metabolic encephalopathy   . Somnolence   . Lobar pneumonia ([place]) 05/11/2019  . Dialysis patient ([place])   . Primary osteoarthritis of left hip 04/07/2019  . Metabolic acidosis 62/22/9798  . Complication from renal dialysis device 01/28/2018  . History of colonic polyps 07/30/2017  . Presence of cardiac  and vascular implant and graft   . Fever   . Sepsis ([place])   . ESRD (end stage renal disease) ([place]) 02/01/2017  . Sepsis secondary to UTI ([place]) 01/24/2017  . Acute kidney injury ([place]) 11/19/2016  . Anemia, chronic renal failure, stage 4 (severe) ([place]) 09/17/2016  . Hypoglycemia 06/04/2016  . Hyperkalemia 05/12/2016  . Protein-calorie malnutrition, severe 05/05/2016  . Acute on chronic renal failure ([place]) 05/04/2016  . Convulsions ([place]) 04/17/2016  . Palliative care by specialist   . DNR (do not resuscitate) discussion   . C. difficile diarrhea 03/11/2016  . Anemia 03/11/2016  . Hypotension 03/11/2016  . Acidosis 03/11/2016  . Failure to thrive (child) 03/11/2016  . Weakness generalized 03/11/2016  . ARF (acute renal failure) ([place]) 03/04/2016  . Cancer of trigone of  urinary bladder ([place]) 03/02/2016  . Absolute anemia 02/04/2016  . Airway hyperreactivity 02/04/2016  . Celiac disease 02/04/2016  . Gastric catarrh 02/04/2016  . Acid reflux 02/04/2016  . Combined fat and carbohydrate induced hyperlipemia 02/04/2016  . C. difficile colitis 01/22/2016  . Urinary obstruction 01/21/2016  . COPD (chronic obstructive pulmonary disease) ([place]) 01/21/2016  . Controlled type 2 diabetes mellitus with stage 4 chronic kidney disease, with long-term current use of insulin ([place]) 01/21/2016  . Essential hypertension 01/21/2016  . Acute renal failure superimposed on stage 4 chronic kidney disease ([place]) 01/20/2016  . Leukocytosis 01/20/2016  . Anemia in chronic kidney disease 01/20/2016  . Arthritis of knee, degenerative 05/10/2015  . Knee strain 03/26/2015  . Other intervertebral disc displacement, lumbar region 03/04/2015  . Degenerative arthritis of lumbar spine 03/04/2015  . HNP (herniated nucleus pulposus), lumbar 03/04/2015  . Osteoarthritis of spine with radiculopathy, lumbar region 03/04/2015  . Injury of tendon of upper extremity 11/12/2014  . Atherosclerosis of abdominal aorta ([place]) 11/02/2014  . Chronic kidney disease, stage IV (severe) ([place]) 11/02/2014  . Obstructive apnea 11/02/2014  . Complete rotator cuff rupture of left shoulder 10/05/2014  . Arthritis of shoulder region, degenerative 10/05/2014  . CAD in native artery 12/08/2013  . Benign essential HTN 12/08/2013  . Type 2 diabetes mellitus ([place]) 12/08/2013   PCP:  [name], MD Pharmacy:   [id number] [place] [place] [id number]: [phone]: [phone]    Social Determinants of Health (SDOH) Interventions    Readmission Risk Interventions Readmission Risk Prevention Plan 09/29/2019  Transportation Screening Complete  Medication Review (RN Care Manager) Complete  Palliative Care Screening Not Applicable  Some recent  data might be hidden

## 2019-09-29 NOTE — NC FL2 (Signed)
[place] LEVEL OF CARE SCREENING TOOL     IDENTIFICATION  Patient Name: Barbara [name] Birthdate: 1940/11/30 Sex: female Admission Date (Current Location): 09/27/2019  [place] and [place] Number:  Engineering geologist and Address:  [facility], [address], [place], [place] [address]      Provider Number: [id number]  Attending Physician Name and Address:  Lavina Hamman, MD  Relative Name and Phone Number:       Current Level of Care: Hospital Recommended Level of Care: [place] Prior Approval Number:    Date Approved/Denied:   PASRR Number: [id number]  Discharge Plan: SNF    Current Diagnoses: Patient Active Problem List   Diagnosis Date Noted  . Acute GI bleeding 09/28/2019  . Acute respiratory failure with hypoxia ([place]) 09/28/2019  . Chronic diastolic CHF (congestive heart failure) ([place]) 09/28/2019  . Seizure ([place]) 09/28/2019  . PEG tube malfunction ([place]) 09/28/2019  . Sacral ulcer ([place]) 09/23/2019  . Dysphagia 09/19/2019  . Acute encephalopathy   . Goals of care, counseling/discussion   . Aspiration pneumonia ([place]) 09/11/2019  . Status post total hip replacement, left 09/05/2019  . Type II diabetes mellitus with renal manifestations ([place]) 09/05/2019  . ESRD on dialysis ([place]) 09/05/2019  . Bacteremia due to other bacteria   . Bacteroides fragilis infection   . Acute metabolic encephalopathy   . Somnolence   . Lobar pneumonia ([place]) 05/11/2019  . Dialysis patient ([place])   . Primary osteoarthritis of left hip 04/07/2019  . Metabolic acidosis 83/41/9622  . Complication from renal dialysis device 01/28/2018  . History of colonic polyps 07/30/2017  . Presence of cardiac and vascular implant and graft   . Fever   . Sepsis ([place])   . ESRD (end stage renal disease) ([place]) 02/01/2017  . Sepsis secondary to UTI ([place]) 01/24/2017  . Acute kidney injury ([place]) 11/19/2016  . Anemia, chronic renal failure, stage 4  (severe) ([place]) 09/17/2016  . Hypoglycemia 06/04/2016  . Hyperkalemia 05/12/2016  . Protein-calorie malnutrition, severe 05/05/2016  . Acute on chronic renal failure ([place]) 05/04/2016  . Convulsions ([place]) 04/17/2016  . Palliative care by specialist   . DNR (do not resuscitate) discussion   . C. difficile diarrhea 03/11/2016  . Anemia 03/11/2016  . Hypotension 03/11/2016  . Acidosis 03/11/2016  . Failure to thrive (child) 03/11/2016  . Weakness generalized 03/11/2016  . ARF (acute renal failure) ([place]) 03/04/2016  . Cancer of trigone of urinary bladder ([place]) 03/02/2016  . Absolute anemia 02/04/2016  . Airway hyperreactivity 02/04/2016  . Celiac disease 02/04/2016  . Gastric catarrh 02/04/2016  . Acid reflux 02/04/2016  . Combined fat and carbohydrate induced hyperlipemia 02/04/2016  . C. difficile colitis 01/22/2016  . Urinary obstruction 01/21/2016  . COPD (chronic obstructive pulmonary disease) ([place]) 01/21/2016  . Controlled type 2 diabetes mellitus with stage 4 chronic kidney disease, with long-term current use of insulin ([place]) 01/21/2016  . Essential hypertension 01/21/2016  . Acute renal failure superimposed on stage 4 chronic kidney disease ([place]) 01/20/2016  . Leukocytosis 01/20/2016  . Anemia in chronic kidney disease 01/20/2016  . Arthritis of knee, degenerative 05/10/2015  . Knee strain 03/26/2015  . Other intervertebral disc displacement, lumbar region 03/04/2015  . Degenerative arthritis of lumbar spine 03/04/2015  . HNP (herniated nucleus pulposus), lumbar 03/04/2015  . Osteoarthritis of spine with radiculopathy, lumbar region 03/04/2015  . Injury of tendon of upper extremity 11/12/2014  . Atherosclerosis of abdominal aorta ([place]) 11/02/2014  .  Chronic kidney disease, stage IV (severe) ([place]) 11/02/2014  . Obstructive apnea 11/02/2014  . Complete rotator cuff rupture of left shoulder 10/05/2014  . Arthritis of shoulder region, degenerative 10/05/2014  . CAD in native  artery 12/08/2013  . Benign essential HTN 12/08/2013  . Type 2 diabetes mellitus ([place]) 12/08/2013    Orientation RESPIRATION BLADDER Height & Weight     Self  O2(2L) ([place]) Weight: 77.4 kg Height:  5' 6"  (167.6 cm)  BEHAVIORAL SYMPTOMS/MOOD NEUROLOGICAL BOWEL NUTRITION STATUS      Incontinent    AMBULATORY STATUS COMMUNICATION OF NEEDS Skin   Extensive Assist Verbally Surgical wounds, PU Stage and Appropriate Care, Bruising                       Personal Care Assistance Level of Assistance  Bathing, Feeding, Dressing Bathing Assistance: Maximum assistance Feeding assistance: Limited assistance       Functional Limitations Info        Speech Info: Impaired    SPECIAL CARE FACTORS FREQUENCY  PT (By licensed PT), OT (By licensed OT)                    Contractures Contractures Info: Not present    Additional Factors Info  Code Status(HD MWF, peg tube) Code Status Info: Full Allergies Info: Glipizide ER, Percocet           Current Medications (09/29/2019):  This is the current hospital active medication list Current Facility-Administered Medications  Medication Dose Route Frequency Provider Last Rate Last Admin  . 0.9 %  sodium chloride infusion (Manually program via Guardrails IV Fluids)   Intravenous Once Sharion Settler, NP      . 0.9 %  sodium chloride infusion  10 mL/hr Intravenous Once Nance Pear, MD      . albuterol (PROVENTIL) (2.5 MG/3ML) 0.083% nebulizer solution 2.5 mg  2.5 mg Inhalation Q6H PRN Lavina Hamman, MD      . epoetin alfa (EPOGEN) injection 10,000 Units  10,000 Units Intravenous Q M,W,F-HD Lateef, [place], MD      . feeding supplement (NEPRO CARB STEADY) liquid 1,000 mL  1,000 mL Per Tube Q24H Lavina Hamman, MD 30 mL/hr at 09/28/19 2225 1,000 mL at 09/28/19 2225  . feeding supplement (PRO-STAT SUGAR FREE 64) liquid 30 mL  30 mL Per Tube BID Lavina Hamman, MD   30 mL at 09/28/19 2103  . free water 50 mL  50 mL Per Tube  Q4H Lavina Hamman, MD   50 mL at 09/29/19 0454  . gabapentin (NEURONTIN) capsule 300 mg  300 mg Per Tube TID Lavina Hamman, MD   300 mg at 09/28/19 2103  . lacosamide (VIMPAT) tablet 100 mg  100 mg Per Tube BID Lavina Hamman, MD   100 mg at 09/28/19 2103  . pantoprazole (PROTONIX) injection 40 mg  40 mg Intravenous Q12H Lavina Hamman, MD   40 mg at 09/28/19 2103     Discharge Medications: Please see discharge summary for a list of discharge medications.  Relevant Imaging Results:  Relevant Lab Results:   Additional Information SSN: [id number]  [name], RN

## 2019-09-29 NOTE — Progress Notes (Signed)
Patient's Hgb is 6.7 this am. I messaged Sharion Settler, NP about blood transfusion and asked if it could be done in dialysis and she said yes.

## 2019-09-29 NOTE — Progress Notes (Signed)
1 unit RBC's, begin transfusion    09/29/19 0850  Hand-Off documentation  Report given to (Full Name) S.Couch-Bah, RN   Report received from (Full Name) Claiborne Billings, RN  Vital Signs  Temp (!) 97.4 F (36.3 C)  Temp Source Axillary  Pulse Rate 75  Resp 18  BP (!) 115/46  Oxygen Therapy  SpO2 100 %  O2 Device Nasal Cannula  O2 Flow Rate (L/min) 2 L/min

## 2019-09-29 NOTE — Anesthesia Postprocedure Evaluation (Signed)
Anesthesia Post Note  Patient: Barbara [name]  Procedure(s) Performed: ESOPHAGOGASTRODUODENOSCOPY (EGD) WITH PROPOFOL (N/A )  Patient location during evaluation: PACU Anesthesia Type: General Level of consciousness: awake and alert Pain management: pain level controlled Vital Signs Assessment: post-procedure vital signs reviewed and stable Respiratory status: spontaneous breathing, nonlabored ventilation, respiratory function stable and patient connected to nasal cannula oxygen Cardiovascular status: blood pressure returned to baseline and stable Postop Assessment: no apparent nausea or vomiting Anesthetic complications: no     Last Vitals:  Vitals:   09/29/19 0900 09/29/19 0915  BP: (!) 121/44 (!) 126/51  Pulse: 74 73  Resp: 20 19  Temp:    SpO2:      Last Pain:  Vitals:   09/29/19 0850  TempSrc: Axillary  PainSc: 0-No pain                 Martha Clan

## 2019-09-29 NOTE — Progress Notes (Signed)
Tx Completed    09/29/19 1124  Vital Signs  Temp 97.8 F (36.6 C)  Pulse Rate 78  Resp (!) 28  BP (!) 138/56  Oxygen Therapy  SpO2 100 %  During Hemodialysis Assessment  KECN 53.4 KECN  Dialysis Fluid Bolus Normal Saline  Bolus Amount (mL) 250 mL  Intra-Hemodialysis Comments Tx completed

## 2019-09-29 NOTE — Progress Notes (Signed)
Pre HD     09/29/19 0745  Vital Signs  Temp (!) 97.4 F (36.3 C)  Temp Source Axillary  Pulse Rate 75  Pulse Rate Source Dinamap  Resp 20  BP (!) 115/46  BP Location Right Arm  BP Method Automatic  Patient Position (if appropriate) Lying  Oxygen Therapy  SpO2 100 %  O2 Device Nasal Cannula  O2 Flow Rate (L/min) 2 L/min  Pain Assessment  Pain Scale 0-10  Pain Score 0  Dialysis Weight  Weight 77.4 kg  Type of Weight Pre-Dialysis  Time-Out for Hemodialysis  What Procedure? HD   Pt Identifiers(min of two) First/Last Name;MRN/Account#;Pt's DOB(use if MRN/Acct# not available  Correct Site? Yes  Correct Side? Yes  Correct Procedure? Yes  Consents Verified? Yes  Rad Studies Available? N/A  Safety Precautions Reviewed? Yes  Engineer, civil (consulting) Number 2  Station Number 1  UF/Alarm Test Passed  Conductivity: Meter 14  Conductivity: Machine  14  pH 7.2  Reverse Osmosis main  Normal Saline Lot Number [id number]  Dialyzer Lot Number [id number]  Disposable Set Lot Number [id number]  Machine Temperature 98.6 F (90 C)  Musician and Audible Yes  Blood Lines Intact and Secured Yes  Pre Treatment Patient Checks  Vascular access used during treatment Fistula  HD catheter dressing before treatment  (n/a)  Patient is receiving dialysis in a chair  (no)  Hepatitis B Surface Antigen Results Negative  Date Hepatitis B Surface Antigen Drawn 09/11/19  Isolation Initiated  (no)  Hemodialysis Consent Verified Yes  Hemodialysis Standing Orders Initiated Yes  ECG (Telemetry) Monitor On Yes  Prime Ordered Normal Saline  Length of  DialysisTreatment -hour(s) 3 Hour(s)  Dialysis Treatment Comments  (Na 140)  Dialyzer Elisio 17H NR  Dialysate Flow Ordered 600  Blood Flow Rate Ordered 300 mL/min  Ultrafiltration Goal 0 Liters  Dialysis Blood Pressure Support Ordered Normal Saline  Education / Care Plan  Dialysis Education Provided Yes  Documented Education in Care Plan Yes   Outpatient Plan of Care Reviewed and on Chart Yes  Fistula / Graft Left Upper arm Arteriovenous vein graft  Placement Date/Time: 02/17/17 1111   Orientation: Left  Access Location: Upper arm  Access Type: Arteriovenous vein graft  Site Condition Other (Comment)  Fistula / Graft Assessment Bruit;Present (very light bruit)  Status Accessed  Needle Size 15  Drainage Description None

## 2019-09-29 NOTE — Plan of Care (Signed)
Continuing with plan of care. 

## 2019-09-29 NOTE — Progress Notes (Signed)
Post HD     09/29/19 1130  Vital Signs  Temp 97.8 F (36.6 C)  Pulse Rate 80  Resp (!) 21  BP 140/68  Oxygen Therapy  SpO2 100 %  O2 Device Nasal Cannula  O2 Flow Rate (L/min) 2 L/min  Post-Hemodialysis Assessment  Rinseback Volume (mL) 250 mL  KECN 53.4 V  Dialyzer Clearance Lightly streaked  Duration of HD Treatment -hour(s) 3 hour(s)  Hemodialysis Intake (mL) 500 mL  UF Total -Machine (mL) 500 mL  Net UF (mL) 0 mL  Tolerated HD Treatment Yes  AVG/AVF Arterial Site Held (minutes)  (5 min)  AVG/AVF Venous Site Held (minutes)  (5 min)  Fistula / Graft Left Upper arm Arteriovenous vein graft  Placement Date/Time: 02/17/17 1111   Orientation: Left  Access Location: Upper arm  Access Type: Arteriovenous vein graft  Site Condition No complications  Fistula / Graft Assessment Bruit;Thrill;Present  Status Deaccessed  Drainage Description None

## 2019-09-29 NOTE — Progress Notes (Signed)
[facility]  ROUNDING NOTE   Subjective:  Patient seen and evaluated during hemodialysis. Tolerating well. Blood flow rate 250.   Objective:  Vital signs in last 24 hours:  Temp:  [97.4 F (36.3 C)-98.5 F (36.9 C)] 97.4 F (36.3 C) (03/12 0850) Pulse Rate:  [72-81] 73 (03/12 1000) Resp:  [14-22] 21 (03/12 1000) BP: (95-134)/(42-82) 129/56 (03/12 1000) SpO2:  [94 %-100 %] 100 % (03/12 0850) FiO2 (%):  [28 %] 28 % (03/11 1712) Weight:  [77.4 kg-77.5 kg] 77.4 kg (03/12 0850)  Weight change: -1.3 kg Filed Weights   09/29/19 0455 09/29/19 0745 09/29/19 0850  Weight: 77.5 kg 77.4 kg 77.4 kg    Intake/Output: I/O last 3 completed shifts: In: 5465 [I.V.:210; Blood:1065] Out: 0    Intake/Output this shift:  Total I/O In: 350 [Blood:350] Out: -    Physical Exam: General:  Laying in the bed, no acute distress  HEENT:  Anicteric, moist oral mucous membranes  Lungs:   Normal breathing effort, clear bilateral  Heart:  S1S2, no rubs  Abdomen:   Soft, nontender, bowel sounds present  Extremities:  No peripheral edema  Neurologic:  Awake, alert  Skin:  Warm  Access:  Left arm AV graft    Basic Metabolic Panel: Recent Labs  Lab 09/23/19 0453 09/24/19 0555 09/27/19 2214 09/28/19 0636 09/29/19 0501  NA  --   --  133* 133* 133*  K 3.6  --  3.5 3.5 4.5  CL  --   --  96* 97* 99  CO2  --   --  27 27 21*  GLUCOSE  --   --  121* 99 119*  BUN  --   --  39* 43* 53*  CREATININE  --   --  4.37* 4.99* 6.85*  CALCIUM  --   --  8.5* 8.4* 8.5*  MG 2.1 2.1  --   --  1.9  PHOS 4.3 4.6  --   --   --     Liver Function Tests: Recent Labs  Lab 09/27/19 2214  AST 36  ALT 17  ALKPHOS 56  BILITOT 0.3  PROT 7.5  ALBUMIN 2.5*   Recent Labs  Lab 09/27/19 2214  LIPASE 63*   No results for input(s): AMMONIA in the last 168 hours.  CBC: Recent Labs  Lab 09/27/19 2214 09/28/19 0636 09/28/19 1404 09/29/19 0501  WBC 19.7* 15.2* 16.8* 11.6*  HGB 4.8* 6.1*  7.5* 6.7*  HCT 16.5* 19.9* 23.1* 22.0*  MCV 101.9* 96.1 88.2 91.3  PLT 246 239 236 298    Cardiac Enzymes: No results for input(s): CKTOTAL, CKMB, CKMBINDEX, TROPONINI in the last 168 hours.  BNP: Invalid input(s): POCBNP  CBG: Recent Labs  Lab 09/28/19 0256 09/28/19 1938 09/29/19 0009 09/29/19 0345 09/29/19 0733  GLUCAP 100* 96 113* 102* 101*    Microbiology: Results for orders placed or performed during the hospital encounter of 09/27/19  SARS CORONAVIRUS 2 (TAT 6-24 HRS) Nasopharyngeal Nasopharyngeal Swab     Status: None   Collection Time: 09/28/19  1:40 AM   Specimen: Nasopharyngeal Swab  Result Value Ref Range Status   SARS Coronavirus 2 NEGATIVE NEGATIVE Final    Comment: (NOTE) SARS-CoV-2 target nucleic acids are NOT DETECTED. The SARS-CoV-2 RNA is generally detectable in upper and lower respiratory specimens during the acute phase of infection. Negative results do not preclude SARS-CoV-2 infection, do not rule out co-infections with other pathogens, and should not be used as the sole basis  for treatment or other patient management decisions. Negative results must be combined with clinical observations, patient history, and epidemiological information. The expected result is Negative. Fact Sheet for Patients: SugarRoll.be Fact Sheet for Healthcare Providers: https://www.woods-mathews.com/ This test is not yet approved or cleared by the [place] FDA and  has been authorized for detection and/or diagnosis of SARS-CoV-2 by FDA under an Emergency Use Authorization (EUA). This EUA will remain  in effect (meaning this test can be used) for the duration of the COVID-19 declaration under Section 56 4(b)(1) of the Act, 21 U.S.C. section 360bbb-3(b)(1), unless the authorization is terminated or revoked sooner. Performed at [facility], [place] [address] [place], [place] [address]   MRSA PCR Screening     Status:  None   Collection Time: 09/28/19  3:08 AM   Specimen: Nasal Mucosa; Nasopharyngeal  Result Value Ref Range Status   MRSA by PCR NEGATIVE NEGATIVE Final    Comment:        The GeneXpert MRSA Assay (FDA approved for NASAL specimens only), is one component of a comprehensive MRSA colonization surveillance program. It is not intended to diagnose MRSA infection nor to guide or monitor treatment for MRSA infections. Performed at [facility], [address] [place], [place] [address]     Coagulation Studies: Recent Labs    09/27/19 09-26-12  LABPROT 15.1  INR 1.2    Urinalysis: No results for input(s): COLORURINE, LABSPEC, PHURINE, GLUCOSEU, HGBUR, BILIRUBINUR, KETONESUR, PROTEINUR, UROBILINOGEN, NITRITE, LEUKOCYTESUR in the last 72 hours.  Invalid input(s): APPERANCEUR    Imaging: DG ABDOMEN PEG TUBE LOCATION  Result Date: 09/28/2019 CLINICAL DATA:  Peg tube location. EXAM: ABDOMEN - 1 VIEW COMPARISON:  09/13/2019 FINDINGS: A gastrostomy tube projects over the gastric bubble. The bowel gas pattern is nonobstructive. Extensive phleboliths project over the patient's pelvis. The patient is status post prior total hip arthroplasty on the left. IMPRESSION: Gastrostomy tube projects over the stomach. Electronically Signed   By: Constance Holster M.D.   On: 09/28/2019 01:41   DG Chest Port 1 View  Result Date: 09/28/2019 CLINICAL DATA:  Central line clogged, no blood return. Concern for line placement in lung. EXAM: PORTABLE CHEST 1 VIEW COMPARISON:  Chest x-ray from earlier same day FINDINGS: RIGHT IJ line is stable in position with tip projected over the RIGHT lung apex, possibly within a small branch of the brachiocephalic or subclavian vein. Or a heart size and mediastinal contours are stable. Stable blunting of the LEFT costophrenic angle, atelectasis versus small pleural effusion. No new lung findings. No pneumothorax is seen. IMPRESSION: 1. Stable position of the RIGHT IJ  line with tip projected over the RIGHT lung apex, possibly within a small branch of the brachiocephalic or subclavian vein. 2. No pneumothorax seen. 3. Probable atelectasis and/or small pleural effusion at the LEFT lung base. Electronically Signed   By: Franki Cabot M.D.   On: 09/28/2019 15:14   DG Chest Port 1 View  Result Date: 09/28/2019 CLINICAL DATA:  Central line placement EXAM: PORTABLE CHEST 1 VIEW COMPARISON:  September 22, 2019 FINDINGS: There is a right-sided central venous catheter with tip projecting over the right lung apex. This may be in the region of the distal right internal jugular vein. Exact positioning is difficult to determine on this study. A stent projects over the patient's upper mediastinum. The heart size is stable. There is stable blunting of the left costophrenic angle. There is no pneumothorax. IMPRESSION: 1. Right-sided central venous catheter  as above.  No pneumothorax. 2. Relatively stable appearance of the chest when compared to prior x-ray. Electronically Signed   By: Constance Holster M.D.   On: 09/28/2019 01:40     Medications:   . sodium chloride     . sodium chloride   Intravenous Once  . feeding supplement (NEPRO CARB STEADY)  1,000 mL Per Tube Q24H  . feeding supplement (PRO-STAT SUGAR FREE 64)  30 mL Per Tube BID  . free water  50 mL Per Tube Q4H  . gabapentin  300 mg Per Tube TID  . lacosamide  100 mg Per Tube BID  . pantoprazole (PROTONIX) IV  40 mg Intravenous Q12H     Assessment/ Plan:    [name] is a 79 y.o. black female with end stage renal disease on hemodialysis, hypertension, diabetes mellitus type II, diabetic neuropathy, seizure disorder, left hip repair, anemia of chronic kidney disease, secondary hyperparathyroidism  [facility] MWF [facility] LUE AVG 79.5kg   1.  ESRD on HD MWF.  Patient seen and evaluated during hemodialysis.  Tolerating well.  Plan to complete dialysis treatment today.  Blood flow rate currently 250  due to high arterial pressures.  2.  Acute GI bleed. Hemoglobin was up to 7.5 posttransfusion but now down to 6.7.  Defer plans regarding transfusion to primary team.   3.  Anemia of chronic kidney disease.   Lab Results  Component Value Date   HGB 6.7 (L) 09/29/2019  We will resume Epogen with dialysis treatments.  4.  Secondary hyperparathyroidism.   Lab Results  Component Value Date   PTH 310 (H) 09/06/2019   CALCIUM 8.5 (L) 09/29/2019   CAION 0.85 (LL) 09/05/2019   PHOS 4.6 09/24/2019  Phosphorus 1.6 at last check.  Continue to monitor closely.      LOS: 1 Barbara [name] 3/12/202110:18 AM

## 2019-09-30 LAB — BASIC METABOLIC PANEL
Anion gap: 12 (ref 5–15)
BUN: 37 mg/dL — ABNORMAL HIGH (ref 8–23)
CO2: 28 mmol/L (ref 22–32)
Calcium: 8.1 mg/dL — ABNORMAL LOW (ref 8.9–10.3)
Chloride: 98 mmol/L (ref 98–111)
Creatinine, Ser: 5 mg/dL — ABNORMAL HIGH (ref 0.44–1.00)
GFR calc Af Amer: 9 mL/min — ABNORMAL LOW (ref >60)
GFR calc non Af Amer: 8 mL/min — ABNORMAL LOW (ref >60)
Glucose, Bld: 109 mg/dL — ABNORMAL HIGH (ref 70–99)
Potassium: 4.3 mmol/L (ref 3.5–5.1)
Sodium: 138 mmol/L (ref 135–145)

## 2019-09-30 LAB — CBC
HCT: 29.9 % — ABNORMAL LOW (ref 36.0–46.0)
Hemoglobin: 9.7 g/dL — ABNORMAL LOW (ref 12.0–15.0)
MCH: 29 pg (ref 26.0–34.0)
MCHC: 32.4 g/dL (ref 30.0–36.0)
MCV: 89.3 fL (ref 80.0–100.0)
Platelets: 269 10*3/uL (ref 150–400)
RBC: 3.35 MIL/uL — ABNORMAL LOW (ref 3.87–5.11)
RDW: 18.4 % — ABNORMAL HIGH (ref 11.5–15.5)
WBC: 12.6 10*3/uL — ABNORMAL HIGH (ref 4.0–10.5)
nRBC: 0 % (ref 0.0–0.2)

## 2019-09-30 LAB — BPAM RBC
Blood Product Expiration Date: 202103132359
Blood Product Expiration Date: 202103302359
Blood Product Expiration Date: 202103302359
ISSUE DATE / TIME: 202103110041
ISSUE DATE / TIME: 202103111000
ISSUE DATE / TIME: 202103120839
Unit Type and Rh: 5100
Unit Type and Rh: 5100
Unit Type and Rh: 9500

## 2019-09-30 LAB — TYPE AND SCREEN
ABO/RH(D): O POS
Antibody Screen: NEGATIVE
Unit division: 0
Unit division: 0
Unit division: 0

## 2019-09-30 LAB — GLUCOSE, CAPILLARY
Glucose-Capillary: 103 mg/dL — ABNORMAL HIGH (ref 70–99)
Glucose-Capillary: 105 mg/dL — ABNORMAL HIGH (ref 70–99)
Glucose-Capillary: 117 mg/dL — ABNORMAL HIGH (ref 70–99)
Glucose-Capillary: 121 mg/dL — ABNORMAL HIGH (ref 70–99)
Glucose-Capillary: 122 mg/dL — ABNORMAL HIGH (ref 70–99)
Glucose-Capillary: 123 mg/dL — ABNORMAL HIGH (ref 70–99)

## 2019-09-30 MED ORDER — METHOCARBAMOL 500 MG PO TABS
500.0000 mg | ORAL_TABLET | Freq: Once | ORAL | Status: AC
  Administered 2019-09-30: 500 mg
  Filled 2019-09-30: qty 1

## 2019-09-30 MED ORDER — FENTANYL CITRATE (PF) 100 MCG/2ML IJ SOLN
12.5000 ug | Freq: Once | INTRAMUSCULAR | Status: AC
  Administered 2019-09-30: 12.5 ug via INTRAVENOUS
  Filled 2019-09-30: qty 2

## 2019-09-30 MED ORDER — METHOCARBAMOL 500 MG PO TABS: 500.0000 mg | ORAL_TABLET | Freq: Three times a day (TID) | ORAL | Status: DC | PRN

## 2019-09-30 MED ORDER — DICLOFENAC SODIUM 1 % EX GEL
2.0000 g | Freq: Four times a day (QID) | CUTANEOUS | Status: DC | PRN
  Administered 2019-10-03 – 2019-10-07 (×2): 2 g via TOPICAL
  Filled 2019-09-30 (×3): qty 100

## 2019-09-30 NOTE — Progress Notes (Signed)
[facility]  ROUNDING NOTE   Subjective:   Hemodialysis treatment yesterday. BFR was low.   Objective:  Vital signs in last 24 hours:  Temp:  [98.4 F (36.9 C)-98.5 F (36.9 C)] 98.4 F (36.9 C) (03/13 1242) Pulse Rate:  [84-88] 85 (03/13 1242) Resp:  [20] 20 (03/13 1242) BP: (119-156)/(60-72) 156/60 (03/13 1242) SpO2:  [91 %-97 %] 97 % (03/13 1242) Weight:  [77.8 kg] 77.8 kg (03/13 0500)  Weight change: -0.1 kg Filed Weights   09/29/19 0745 09/29/19 0850 09/30/19 0500  Weight: 77.4 kg 77.4 kg 77.8 kg    Intake/Output: I/O last 3 completed shifts: In: 692.5 [Blood:662.5; NG/GT:30] Out: 0    Intake/Output this shift:  Total I/O In: 230 [NG/GT:230] Out: 1 [Stool:1]   Physical Exam: General:  Laying in the bed, no acute distress  HEENT:  Anicteric, moist oral mucous membranes  Lungs:   Normal breathing effort, clear bilateral  Heart:  regular  Abdomen:   Soft, nontender, bowel sounds present  Extremities:  No peripheral edema  Neurologic:  Awake, alert  Skin:  Warm  Access:  Left arm AV graft    Basic Metabolic Panel: Recent Labs  Lab 09/24/19 0555 09/27/19 2214 09/27/19 2214 09/28/19 0636 09/29/19 0501 09/30/19 0837  NA  --  133*  --  133* 133* 138  K  --  3.5  --  3.5 4.5 4.3  CL  --  96*  --  97* 99 98  CO2  --  27  --  27 21* 28  GLUCOSE  --  121*  --  99 119* 109*  BUN  --  39*  --  43* 53* 37*  CREATININE  --  4.37*  --  4.99* 6.85* 5.00*  CALCIUM  --  8.5*   < > 8.4* 8.5* 8.1*  MG 2.1  --   --   --  1.9  --   PHOS 4.6  --   --   --   --   --    < > = values in this interval not displayed.    Liver Function Tests: Recent Labs  Lab 09/27/19 2214  AST 36  ALT 17  ALKPHOS 56  BILITOT 0.3  PROT 7.5  ALBUMIN 2.5*   Recent Labs  Lab 09/27/19 2214  LIPASE 63*   No results for input(s): AMMONIA in the last 168 hours.  CBC: Recent Labs  Lab 09/27/19 2214 09/28/19 0636 09/28/19 1404 09/29/19 0501 09/30/19 0837  WBC  19.7* 15.2* 16.8* 11.6* 12.6*  HGB 4.8* 6.1* 7.5* 6.7* 9.7*  HCT 16.5* 19.9* 23.1* 22.0* 29.9*  MCV 101.9* 96.1 88.2 91.3 89.3  PLT 246 239 236 298 269    Cardiac Enzymes: No results for input(s): CKTOTAL, CKMB, CKMBINDEX, TROPONINI in the last 168 hours.  BNP: Invalid input(s): POCBNP  CBG: Recent Labs  Lab 09/29/19 1945 09/30/19 0009 09/30/19 0404 09/30/19 0742 09/30/19 1136  GLUCAP 85 103* 121* 122* 123*    Microbiology: Results for orders placed or performed during the hospital encounter of 09/27/19  SARS CORONAVIRUS 2 (TAT 6-24 HRS) Nasopharyngeal Nasopharyngeal Swab     Status: None   Collection Time: 09/28/19  1:40 AM   Specimen: Nasopharyngeal Swab  Result Value Ref Range Status   SARS Coronavirus 2 NEGATIVE NEGATIVE Final    Comment: (NOTE) SARS-CoV-2 target nucleic acids are NOT DETECTED. The SARS-CoV-2 RNA is generally detectable in upper and lower respiratory specimens during the acute phase of infection.  Negative results do not preclude SARS-CoV-2 infection, do not rule out co-infections with other pathogens, and should not be used as the sole basis for treatment or other patient management decisions. Negative results must be combined with clinical observations, patient history, and epidemiological information. The expected result is Negative. Fact Sheet for Patients: SugarRoll.be Fact Sheet for Healthcare Providers: https://www.woods-mathews.com/ This test is not yet approved or cleared by the [place] FDA and  has been authorized for detection and/or diagnosis of SARS-CoV-2 by FDA under an Emergency Use Authorization (EUA). This EUA will remain  in effect (meaning this test can be used) for the duration of the COVID-19 declaration under Section 56 4(b)(1) of the Act, 21 U.S.C. section 360bbb-3(b)(1), unless the authorization is terminated or revoked sooner. Performed at [facility], [place] [address], [place], [place] [address]   MRSA PCR Screening     Status: None   Collection Time: 09/28/19  3:08 AM   Specimen: Nasal Mucosa; Nasopharyngeal  Result Value Ref Range Status   MRSA by PCR NEGATIVE NEGATIVE Final    Comment:        The GeneXpert MRSA Assay (FDA approved for NASAL specimens only), is one component of a comprehensive MRSA colonization surveillance program. It is not intended to diagnose MRSA infection nor to guide or monitor treatment for MRSA infections. Performed at [facility], [address] [place], [place] [address]     Coagulation Studies: Recent Labs    09/27/19 09/22/12  LABPROT 15.1  INR 1.2    Urinalysis: No results for input(s): COLORURINE, LABSPEC, PHURINE, GLUCOSEU, HGBUR, BILIRUBINUR, KETONESUR, PROTEINUR, UROBILINOGEN, NITRITE, LEUKOCYTESUR in the last 72 hours.  Invalid input(s): APPERANCEUR    Imaging: DG Chest Port 1 View  Result Date: 09/28/2019 CLINICAL DATA:  Central line clogged, no blood return. Concern for line placement in lung. EXAM: PORTABLE CHEST 1 VIEW COMPARISON:  Chest x-ray from earlier same day FINDINGS: RIGHT IJ line is stable in position with tip projected over the RIGHT lung apex, possibly within a small branch of the brachiocephalic or subclavian vein. Or a heart size and mediastinal contours are stable. Stable blunting of the LEFT costophrenic angle, atelectasis versus small pleural effusion. No new lung findings. No pneumothorax is seen. IMPRESSION: 1. Stable position of the RIGHT IJ line with tip projected over the RIGHT lung apex, possibly within a small branch of the brachiocephalic or subclavian vein. 2. No pneumothorax seen. 3. Probable atelectasis and/or small pleural effusion at the LEFT lung base. Electronically Signed   By: Franki Cabot M.D.   On: 09/28/2019 15:14     Medications:   . sodium chloride     . sodium chloride   Intravenous Once  . B-complex with vitamin C  1 tablet Per Tube QHS  .  epoetin (EPOGEN/PROCRIT) injection  10,000 Units Intravenous Q M,W,F-HD  . feeding supplement (NEPRO CARB STEADY)  1,000 mL Per Tube Q24H  . feeding supplement (PRO-STAT SUGAR FREE 64)  30 mL Per Tube BID  . free water  50 mL Per Tube 6 X Daily  . gabapentin  300 mg Per Tube TID  . lacosamide  100 mg Per Tube BID  . pantoprazole (PROTONIX) IV  40 mg Intravenous Q12H     Assessment/ Plan:  Barbara [name] is a 79 y.o. black female with end stage renal disease on hemodialysis, hypertension, diabetes mellitus type II, diabetic neuropathy, seizure disorder, left hip repair, anemia of chronic kidney disease, secondary  hyperparathyroidism  [facility] MWF [facility] LUE AVG 79.5kg   1.  ESRD on HD MWF.  Continue MWF schedule. Monitor AVG  2. Anemia of chronic kidney disease.  With GI bleed - Continue EPO  3.  Secondary hyperparathyroidism.  Holding binders.   4. Hypertension: 156/60. Holding blood pressure agents.        LOS: 2 Haynes Giannotti 3/13/202112:53 PM

## 2019-09-30 NOTE — Progress Notes (Signed)
Physical Therapy Evaluation Patient Details Name: Barbara [name] MRN: [id number] DOB: Dec 03, 1940 Today's Date: 09/30/2019   History of Present Illness  Per MD note: has presented today for surgery, with the diagnosis of hemetemesis, anemia secondary to acute blood loss.    Clinical Impression  Patient was recently hospitalized from 2/16-3/7.  She was initially admitted by orthopedic for left total hip arthroplasty on 09/05/2019 however she later developed acute respiratory distress and was subsequently transferred to ICU for acute hypoxemic and hypercarbic respiratory failure ultimately requiring intubation and mechanical ventilation.  She was also treated with IV antibiotics for aspiration pneumonia.  She required vasopressor for hypotension.  She was also initially placed on CRRT and then transition to hemodialysis by nephrology.She was seen by neurologist at that time for altered mental status and seizure activity..She had PEG placement on 09/20/2019 for tube feeding due to dysphagia.[facility].  Reportedly she had 2 days of coffee-ground emesis which the patient does not recall.  Also had pulled out her PEG tube.  Facility noted her to have hemoglobin of 5 and sent her to ER for further evaluation.Now she is s/p surgery for hemetemesis. She is limited by inconsistent mental cognition to be able to follow commands or participate fully in PT. She reports RLE leg pain 10/10. She needs max assist for all bed mobility, is self limiting due to poor mental cognition, and also limited by weakness and RLE leg pain. She has poor strength BLE hips and knees. Patient will benefit from skilled PT to improve strength and mobility.     Follow Up Recommendations SNF    Equipment Recommendations       Recommendations for Other Services       Precautions / Restrictions Restrictions Weight Bearing Restrictions: Yes LLE Weight Bearing: Weight bearing as tolerated       Mobility  Bed Mobility Overal bed mobility: Needs Assistance Bed Mobility: Supine to Sit;Sit to Supine Rolling: Total assist   Supine to sit: Total assist Sit to supine: Total assist   General bed mobility comments: (pain and cognition limiting ability to participate)  Transfers Overall transfer level: (unable to tolerate sitting or bed mobility due to pain RLE)                  Ambulation/Gait                Stairs            Wheelchair Mobility    Modified Rankin (Stroke Patients Only)       Balance                                             Pertinent Vitals/Pain Pain Assessment: 0-10 Pain Score: 10-Worst pain ever Pain Location: R leg Pain Descriptors / Indicators: Aching    Home Living Family/patient expects to be discharged to:: Skilled nursing facility Living Arrangements: Spouse/significant other Available Help at Discharge: Family Type of Home: House Home Access: Ramped entrance     Home Layout: One level        Prior Function Level of Independence: Needs assistance   Gait / Transfers Assistance Needed: (Patient was in rehab for RLE hip surgery, needs assist amb)           Hand Dominance        Extremity/Trunk Assessment  Upper Extremity Assessment Upper Extremity Assessment: Overall WFL for tasks assessed    Lower Extremity Assessment Lower Extremity Assessment: Generalized weakness RLE Deficits / Details: (RLE hip 2/5, knee 2/5, LLE hip -3/5)       Communication   Communication: Other (comment)(difficult to understand)  Cognition Arousal/Alertness: Awake/alert Behavior During Therapy: Flat affect Overall Cognitive Status: Impaired/Different from baseline Area of Impairment: Following commands;Safety/judgement;Awareness                       Following Commands: Follows one step commands inconsistently Safety/Judgement: Decreased awareness of safety            General  Comments      Exercises Total Joint Exercises Short Arc Quad: AAROM;Strengthening;Right;Left;5 reps   Assessment/Plan    PT Assessment Patient needs continued PT services  PT Problem List Decreased strength;Decreased range of motion;Decreased activity tolerance;Decreased mobility;Decreased safety awareness       PT Treatment Interventions Gait training;Functional mobility training;Therapeutic activities;Therapeutic exercise    PT Goals (Current goals can be found in the Care Plan section)  Acute Rehab PT Goals Patient Stated Goal: no goals stated PT Goal Formulation: Patient unable to participate in goal setting Time For Goal Achievement: 10/14/19 Potential to Achieve Goals: Fair    Frequency Min 2X/week   Barriers to discharge        Co-evaluation               AM-PAC PT "6 Clicks" Mobility  Outcome Measure Help needed turning from your back to your side while in a flat bed without using bedrails?: Total Help needed moving from lying on your back to sitting on the side of a flat bed without using bedrails?: Total Help needed moving to and from a bed to a chair (including a wheelchair)?: Total Help needed standing up from a chair using your arms (e.g., wheelchair or bedside chair)?: Total Help needed to walk in hospital room?: Total Help needed climbing 3-5 steps with a railing? : Total 6 Click Score: 6    End of Session Equipment Utilized During Treatment: Gait belt Activity Tolerance: Patient limited by pain Patient left: in bed;with bed alarm set Nurse Communication: Mobility status PT Visit Diagnosis: Muscle weakness (generalized) (M62.81);Difficulty in walking, not elsewhere classified (R26.2) Pain - Right/Left: Right Pain - part of body: Leg    Time: 9470-9628 PT Time Calculation (min) (ACUTE ONLY): 25 min   Charges:   PT Evaluation $PT Eval Low Complexity: 1 Low PT Treatments $Therapeutic Activity: 8-22 mins          Alanson Puls,  PT DPT 09/30/2019, 1:51 PM

## 2019-09-30 NOTE — Progress Notes (Signed)
Chart reviewed, Pt visited. Pt was only able to stay awake for seconds at a time.Needed continuous cues to stay awake for a conversation about swallowing. Given lethargy at present time, Pt is appropriate for assessment with Po's secondary to risk of aspiration. Noted Pt has tube feedings and was previously taking small amounts of dysphagia I with nectar thick liquids on last admission. If Pt is discharged back to facility, rec. ST consult to assess for continued toleration of PO diet of puree with nectar thick liquids as well as RD consult to assist with decreasing tube feeding based on PO intake.

## 2019-09-30 NOTE — Progress Notes (Signed)
[facility] Progress Note  Patient: Barbara [name]    ERX:[id number]  DOA: 09/27/2019     Date of Service: the patient was seen and examined on 09/30/2019  Chief Complaint  Patient presents with  . Hematemesis   Brief hospital course: TATIA PETRUCCI is a 79 y.o. female with medical history significant for ESRD on HD MWF, hyperlipidemia, type 2 diabetes, COPD, asthma, GERD, bladder cancer, and iron deficiency anemia who presents for concerns of acute GI bleed and anemia. SP EGD and replacement of the G-tube. Currently further plan is continue to monitor H&H.  Assessment and Plan: Acute GI bleed Hemoglobin of 4.8 with a baseline of around 8-9. Needed central line placement given difficulty with peripheral line access Transfused 2 units, hemoglobin low again on 09/29/2019 SP 1 more PRBC transfusion. Continue IV PPI TWICE daily Appreciate GI assistance.  SP EGD.  Acute blood loss anemia. Anemia of chronic kidney disease Presented with GI bleed.  Hemoglobin dropped down to 4.8.  Transfused 3 PRBC.  Currently H&H is appropriate.  No further bleeding.  Monitor.  PEG tube dependency with dysphagia Placed on 09/20/2019 due to dysphagia Appreciate GI assistance for replacement of the G-tube. GI recommended to resume diet. Currently tolerating well.  Acute hypoxic respiratory failure Noted by nursing that she chronically is on 2L but this is not noted in last discharge note. Could be due to acute anemia Currently improving. Continue to monitor  Leukocytosis This appears to have been chronic for the past week.  No signs of fever. Although should she becomes febrile suspected could be GI related  and will start antibiotics at that time.  ESRD on HD MWF Creatinine is stable Need to monitor for any fluid overload with blood transfusion Appreciate nephrology assistance. Currently volume stable.  Chronic diastolic CHF:  Hemodialysis for fluid management  Type 2 diabetes  controlled. diet-controlled  Currently on sliding scale insulin. Also on Nepro for tube feeding. Monitor  Hx of seizure Continue home regimen per tube.  Stage 1 sacral and buttock ulcers  Wound care per RN   Dysphagia. Patient is significantly awake and alert. Speech therapy consulted for assessment for the diet. Per speech therapy patient was not eating enough likely from not being hungry while she was on tube feeding. Given lethargy currently holding off on further work-up.  Appreciate speech therapy recommendation.  Acute encephalopathy.  Likely toxic. From medication.  Received fentanyl early this morning. Continues to report severe right leg cramps.  And given Robaxin. Continue to monitor for now.  Body mass index is 27.68 kg/m.  Nutrition Problem: Inadequate oral intake Etiology: dysphagia Interventions: Interventions: Tube feeding, Prostat, MVI  Pressure Injury 09/21/19 Buttocks Right Stage 2 -  Partial thickness loss of dermis presenting as a shallow open injury with a red, pink wound bed without slough. 0.5cmx0.5cm (Active)  09/21/19 0830  Location: Buttocks  Location Orientation: Right  Staging: Stage 2 -  Partial thickness loss of dermis presenting as a shallow open injury with a red, pink wound bed without slough.  Wound Description (Comments): 0.5cmx0.5cm  Present on Admission: No     Diet: Currently n.p.o.  On tube feeding. DVT Prophylaxis: SCD, pharmacological prophylaxis contraindicated due to GI bleed   Advance goals of care discussion: Full code  Family Communication: family was present at bedside, at the time of interview.  The pt provided permission to discuss medical plan with the family. Opportunity was given to ask question and all questions were answered satisfactorily.  Disposition:  Pt is from SNF, admitted with upper GI bleed from G-tube malfunction, still has anemia requiring blood transfusion, which precludes a safe discharge. Discharge  to SNF, when medically stable likely Monday.  Subjective: Reports ankle pain.  No nausea no vomiting.  Required last night fentanyl.  No chest pain abdominal pain.  More confused than yesterday.  Physical Exam: General:  alert oriented to person.  Appear in mild distress, affect appropriate Eyes: PERRL ENT: Oral Mucosa Clear, moist  Neck: no JVD,  Cardiovascular: S1 and S2 Present, no Murmur,  Respiratory: good respiratory effort, Bilateral Air entry equal and Decreased, no Crackles, no wheezes Abdomen: Bowel Sound present, Soft and no tenderness,  Skin: no rash Extremities: no Pedal edema, no calf tenderness Neurologic: without any new focal findings  Gait not checked due to patient safety concerns  Vitals:   09/29/19 2013 09/30/19 0410 09/30/19 0500 09/30/19 1242  BP: 119/72 (!) 151/63  (!) 156/60  Pulse: 84 88  85  Resp: 20 20  20   Temp: 98.5 F (36.9 C) 98.4 F (36.9 C)  98.4 F (36.9 C)  TempSrc: Oral Oral  Oral  SpO2: 97% 91%  97%  Weight:   77.8 kg   Height:        Intake/Output Summary (Last 24 hours) at 09/30/2019 1621 Last data filed at 09/30/2019 1500 Gross per 24 hour  Intake 260 ml  Output 1 ml  Net 259 ml   Filed Weights   09/29/19 0745 09/29/19 0850 09/30/19 0500  Weight: 77.4 kg 77.4 kg 77.8 kg    Data Reviewed: I have personally reviewed and interpreted daily labs, tele strips, imagings as discussed above. I reviewed all nursing notes, pharmacy notes, vitals, pertinent old records I have discussed plan of care as described above with RN and patient/family.  CBC: Recent Labs  Lab 09/27/19 2214 09/28/19 0636 09/28/19 1404 09/29/19 0501 09/30/19 0837  WBC 19.7* 15.2* 16.8* 11.6* 12.6*  HGB 4.8* 6.1* 7.5* 6.7* 9.7*  HCT 16.5* 19.9* 23.1* 22.0* 29.9*  MCV 101.9* 96.1 88.2 91.3 89.3  PLT 246 239 236 298 378   Basic Metabolic Panel: Recent Labs  Lab 09/24/19 0555 09/27/19 2214 09/28/19 0636 09/29/19 0501 09/30/19 0837  NA  --  133* 133*  133* 138  K  --  3.5 3.5 4.5 4.3  CL  --  96* 97* 99 98  CO2  --  27 27 21* 28  GLUCOSE  --  121* 99 119* 109*  BUN  --  39* 43* 53* 37*  CREATININE  --  4.37* 4.99* 6.85* 5.00*  CALCIUM  --  8.5* 8.4* 8.5* 8.1*  MG 2.1  --   --  1.9  --   PHOS 4.6  --   --   --   --     Studies: No results found.  Scheduled Meds: . sodium chloride   Intravenous Once  . B-complex with vitamin C  1 tablet Per Tube QHS  . epoetin (EPOGEN/PROCRIT) injection  10,000 Units Intravenous Q M,W,F-HD  . feeding supplement (NEPRO CARB STEADY)  1,000 mL Per Tube Q24H  . feeding supplement (PRO-STAT SUGAR FREE 64)  30 mL Per Tube BID  . free water  50 mL Per Tube 6 X Daily  . gabapentin  300 mg Per Tube TID  . lacosamide  100 mg Per Tube BID  . pantoprazole (PROTONIX) IV  40 mg Intravenous Q12H   Continuous Infusions: . sodium chloride  PRN Meds: albuterol, diclofenac Sodium, methocarbamol  Time spent: 35 minutes  Author: Berle Mull, MD [facility] 09/30/2019 4:21 PM  To reach On-call, see care teams to locate the attending and reach out to them via [email or web address]. If 7PM-7AM, please contact night-coverage If you still have difficulty reaching the attending provider, please page the [facility] (Director on Call) for [facility] on amion for assistance.

## 2019-10-01 ENCOUNTER — Inpatient Hospital Stay: Payer: Medicare Other

## 2019-10-01 LAB — CBC
HCT: 27.6 % — ABNORMAL LOW (ref 36.0–46.0)
Hemoglobin: 8.9 g/dL — ABNORMAL LOW (ref 12.0–15.0)
MCH: 29 pg (ref 26.0–34.0)
MCHC: 32.2 g/dL (ref 30.0–36.0)
MCV: 89.9 fL (ref 80.0–100.0)
Platelets: 289 10*3/uL (ref 150–400)
RBC: 3.07 MIL/uL — ABNORMAL LOW (ref 3.87–5.11)
RDW: 17.6 % — ABNORMAL HIGH (ref 11.5–15.5)
WBC: 14.2 10*3/uL — ABNORMAL HIGH (ref 4.0–10.5)
nRBC: 0 % (ref 0.0–0.2)

## 2019-10-01 LAB — BASIC METABOLIC PANEL
Anion gap: 16 — ABNORMAL HIGH (ref 5–15)
BUN: 58 mg/dL — ABNORMAL HIGH (ref 8–23)
CO2: 22 mmol/L (ref 22–32)
Calcium: 8.3 mg/dL — ABNORMAL LOW (ref 8.9–10.3)
Chloride: 97 mmol/L — ABNORMAL LOW (ref 98–111)
Creatinine, Ser: 6.69 mg/dL — ABNORMAL HIGH (ref 0.44–1.00)
GFR calc Af Amer: 6 mL/min — ABNORMAL LOW (ref >60)
GFR calc non Af Amer: 5 mL/min — ABNORMAL LOW (ref >60)
Glucose, Bld: 134 mg/dL — ABNORMAL HIGH (ref 70–99)
Potassium: 4.9 mmol/L (ref 3.5–5.1)
Sodium: 135 mmol/L (ref 135–145)

## 2019-10-01 LAB — GLUCOSE, CAPILLARY
Glucose-Capillary: 103 mg/dL — ABNORMAL HIGH (ref 70–99)
Glucose-Capillary: 107 mg/dL — ABNORMAL HIGH (ref 70–99)
Glucose-Capillary: 114 mg/dL — ABNORMAL HIGH (ref 70–99)
Glucose-Capillary: 116 mg/dL — ABNORMAL HIGH (ref 70–99)
Glucose-Capillary: 116 mg/dL — ABNORMAL HIGH (ref 70–99)
Glucose-Capillary: 120 mg/dL — ABNORMAL HIGH (ref 70–99)

## 2019-10-01 LAB — SARS CORONAVIRUS 2 (TAT 6-24 HRS): SARS Coronavirus 2: NEGATIVE

## 2019-10-01 MED ORDER — GABAPENTIN 250 MG/5ML PO SOLN
100.0000 mg | Freq: Every day | ORAL | Status: DC
  Administered 2019-10-02 – 2019-10-08 (×7): 100 mg
  Filled 2019-10-01 (×16): qty 2

## 2019-10-01 MED ORDER — AMLODIPINE BESYLATE 5 MG PO TABS
5.0000 mg | ORAL_TABLET | Freq: Every day | ORAL | Status: DC
  Administered 2019-10-01 – 2019-10-06 (×6): 5 mg
  Filled 2019-10-01 (×6): qty 1

## 2019-10-01 MED ORDER — GABAPENTIN 250 MG/5ML PO SOLN
100.0000 mg | Freq: Three times a day (TID) | ORAL | Status: DC
  Administered 2019-10-01: 100 mg
  Filled 2019-10-01 (×3): qty 2

## 2019-10-01 MED ORDER — DOXYCYCLINE HYCLATE 100 MG PO TABS
100.0000 mg | ORAL_TABLET | Freq: Two times a day (BID) | ORAL | Status: DC
  Administered 2019-10-01 – 2019-10-02 (×3): 100 mg
  Filled 2019-10-01 (×3): qty 1

## 2019-10-01 NOTE — Progress Notes (Signed)
[facility] Progress Note  Patient: Barbara [name]    PIR:[id number]  DOA: 09/27/2019     Date of Service: the patient was seen and examined on 10/01/2019  Chief Complaint  Patient presents with  . Hematemesis   Brief hospital course: CLARETTA KENDRA is a 79 y.o. female with medical history significant for ESRD on HD MWF, hyperlipidemia, type 2 diabetes, COPD, asthma, GERD, bladder cancer, and iron deficiency anemia who presents for concerns of acute GI bleed and anemia. SP EGD and replacement of the G-tube. Currently further plan is continue monitor for improvement in mentation and stabilization of H&H  Assessment and Plan: Acute GI bleed Hemoglobin of 4.8 with a baseline of around 8-9. Needed central line placement given difficulty with peripheral line access Transfused 2 units, hemoglobin low again on 09/29/2019 SP 1 more PRBC transfusion. Continue IV PPI TWICE daily, will transition to oral tomorrow if hemoglobin stable. Appreciate GI assistance.  SP EGD.  Acute blood loss anemia. Anemia of chronic kidney disease Presented with GI bleed.  Hemoglobin dropped down to 4.8.  Transfused 3 PRBC.  Currently H&H is mildly trending down. No further bleeding.  Monitor.  PEG tube dependency with dysphagia Placed on 09/20/2019 due to dysphagia Appreciate GI assistance for replacement of the G-tube. GI recommended to resume diet. Currently tolerating well.  Acute hypoxic respiratory failure Noted by nursing that she chronically is on 2L but this is not noted in last discharge note. Could be due to acute anemia Currently improving. Continue to monitor  Leukocytosis Concern for cellulitis This appears to have been chronic for the past week.  No signs of fever.  With worsening leukocytosis and worsening mentation We will add doxycycline to treat her for skin infection around the G-tube insertion.  ESRD on HD MWF Creatinine is stable Need to monitor for any fluid overload with blood  transfusion Appreciate nephrology assistance. Currently volume overloaded based on the chest x-ray.  Chronic diastolic CHF:  Hemodialysis for fluid management  Type 2 diabetes controlled. diet-controlled  Currently on sliding scale insulin. Also on Nepro for tube feeding. Monitor  Hx of seizure Continue home regimen per tube.  Stage 1 sacral and buttock ulcers  Wound care per RN   Dysphagia. Patient is significantly awake and alert. Speech therapy consulted for assessment for the diet. Per speech therapy patient was not eating enough likely from not being hungry while she was on tube feeding. Given lethargy currently holding off on further work-up.  Appreciate speech therapy recommendation.  Acute encephalopathy.  Likely toxic. From medication.  Received fentanyl early this morning. Continues to report severe right leg cramps.  And given Robaxin. Continue to monitor for now.  Body mass index is 30.07 kg/m.  Nutrition Problem: Inadequate oral intake Etiology: dysphagia Interventions: Interventions: Tube feeding, Prostat, MVI  Pressure Injury 09/21/19 Buttocks Right Stage 2 -  Partial thickness loss of dermis presenting as a shallow open injury with a red, pink wound bed without slough. 0.5cmx0.5cm (Active)  09/21/19 0830  Location: Buttocks  Location Orientation: Right  Staging: Stage 2 -  Partial thickness loss of dermis presenting as a shallow open injury with a red, pink wound bed without slough.  Wound Description (Comments): 0.5cmx0.5cm  Present on Admission: No     Diet: Currently n.p.o.  On tube feeding. DVT Prophylaxis: SCD, pharmacological prophylaxis contraindicated due to GI bleed   Advance goals of care discussion: Full code  Family Communication: family was present at bedside, at the time  of interview.  The pt provided permission to discuss medical plan with the family. Opportunity was given to ask question and all questions were answered  satisfactorily.   Disposition:  Pt is from SNF, admitted with upper GI bleed from G-tube malfunction, still has anemia requiring blood transfusion, which precludes a safe discharge. Discharge to SNF, when medically stable likely Monday.  Subjective: Confused.  No nausea no vomiting.  No fever no chills.  No other acute events overnight.  Physical Exam: General:  alert oriented to person.  Appear in mild distress, affect appropriate Eyes: PERRL ENT: Oral Mucosa Clear, moist  Neck: no JVD,  Cardiovascular: S1 and S2 Present, no Murmur,  Respiratory: good respiratory effort, Bilateral Air entry equal and Decreased, no Crackles, no wheezes Abdomen: Bowel Sound present, Soft and no tenderness,  Skin: no rash Extremities: no Pedal edema, no calf tenderness Neurologic: without any new focal findings  Gait not checked due to patient safety concerns  Vitals:   09/30/19 2309 10/01/19 0510 10/01/19 1153 10/01/19 1747  BP:  119/67 (!) 163/66 (!) 153/81  Pulse: 95 93 91 100  Resp:  20 18   Temp:  98.7 F (37.1 C) 99.5 F (37.5 C)   TempSrc:  Oral Oral   SpO2: 94% 94% 96%   Weight:  84.5 kg    Height:        Intake/Output Summary (Last 24 hours) at 10/01/2019 1905 Last data filed at 10/01/2019 1500 Gross per 24 hour  Intake 30 ml  Output 40 ml  Net -10 ml   Filed Weights   09/29/19 0850 09/30/19 0500 10/01/19 0510  Weight: 77.4 kg 77.8 kg 84.5 kg    Data Reviewed: I have personally reviewed and interpreted daily labs, tele strips, imagings as discussed above. I reviewed all nursing notes, pharmacy notes, vitals, pertinent old records I have discussed plan of care as described above with RN and patient/family.  CBC: Recent Labs  Lab 09/28/19 0636 09/28/19 1404 09/29/19 0501 09/30/19 0837 10/01/19 0903  WBC 15.2* 16.8* 11.6* 12.6* 14.2*  HGB 6.1* 7.5* 6.7* 9.7* 8.9*  HCT 19.9* 23.1* 22.0* 29.9* 27.6*  MCV 96.1 88.2 91.3 89.3 89.9  PLT 239 236 298 269 599   Basic  Metabolic Panel: Recent Labs  Lab 09/27/19 2214 09/28/19 0636 09/29/19 0501 09/30/19 0837 10/01/19 0903  NA 133* 133* 133* 138 135  K 3.5 3.5 4.5 4.3 4.9  CL 96* 97* 99 98 97*  CO2 27 27 21* 28 22  GLUCOSE 121* 99 119* 109* 134*  BUN 39* 43* 53* 37* 58*  CREATININE 4.37* 4.99* 6.85* 5.00* 6.69*  CALCIUM 8.5* 8.4* 8.5* 8.1* 8.3*  MG  --   --  1.9  --   --     Studies: DG Chest Port 1 View  Result Date: 10/01/2019 CLINICAL DATA:  Short of breath. EXAM: PORTABLE CHEST 1 VIEW COMPARISON:  09/28/2019 FINDINGS: Cardiac silhouette borderline enlarged. No mediastinal or hilar masses. Left sided stent, which appears to extend from the brachiocephalic to the proximal left subclavian vein, stable. Lungs demonstrate vascular congestion, interstitial thickening and mild hazy opacity at the bases, left greater than right. Suspect small pleural effusions. No pneumothorax. Skeletal structures are grossly intact. IMPRESSION: 1. Findings consistent with interstitial pulmonary edema with small pleural effusions. Electronically Signed   By: Lajean Manes M.D.   On: 10/01/2019 12:48    Scheduled Meds: . amLODipine  5 mg Per Tube Daily  . B-complex with vitamin C  1 tablet Per Tube QHS  . doxycycline  100 mg Per Tube Q12H  . epoetin (EPOGEN/PROCRIT) injection  10,000 Units Intravenous Q M,W,F-HD  . feeding supplement (NEPRO CARB STEADY)  1,000 mL Per Tube Q24H  . feeding supplement (PRO-STAT SUGAR FREE 64)  30 mL Per Tube BID  . free water  50 mL Per Tube 6 X Daily  . [START ON 10/02/2019] gabapentin  100 mg Per Tube Daily  . lacosamide  100 mg Per Tube BID  . pantoprazole (PROTONIX) IV  40 mg Intravenous Q12H   Continuous Infusions:  PRN Meds: albuterol, diclofenac Sodium, methocarbamol  Time spent: 35 minutes  Author: Berle Mull, MD [facility] 10/01/2019 7:05 PM  To reach On-call, see care teams to locate the attending and reach out to them via [email or web address]. If 7PM-7AM, please  contact night-coverage If you still have difficulty reaching the attending provider, please page the [facility] (Director on Call) for [facility] on amion for assistance.

## 2019-10-01 NOTE — Progress Notes (Signed)
[facility]  ROUNDING NOTE   Subjective:   Awake and alert. No complaints.   Objective:  Vital signs in last 24 hours:  Temp:  [98 F (36.7 C)-99.5 F (37.5 C)] 99.5 F (37.5 C) (03/14 1153) Pulse Rate:  [73-95] 91 (03/14 1153) Resp:  [18-20] 18 (03/14 1153) BP: (119-163)/(58-67) 163/66 (03/14 1153) SpO2:  [91 %-99 %] 96 % (03/14 1153) Weight:  [84.5 kg] 84.5 kg (03/14 0510)  Weight change: 7.1 kg Filed Weights   09/29/19 0850 09/30/19 0500 10/01/19 0510  Weight: 77.4 kg 77.8 kg 84.5 kg    Intake/Output: I/O last 3 completed shifts: In: 340 [NG/GT:340] Out: 41 [Drains:40; Stool:1]   Intake/Output this shift:  Total I/O In: 30 [Other:30] Out: -    Physical Exam: General:  Laying in the bed, no acute distress  HEENT:  Anicteric, moist oral mucous membranes  Lungs:   Normal breathing effort, clear bilateral  Heart:  regular  Abdomen:   Soft, nontender, bowel sounds present  Extremities:  No peripheral edema  Neurologic:  Awake, alert  Skin:  Warm  Access:  Left arm AV graft    Basic Metabolic Panel: Recent Labs  Lab 09/27/19 2214 09/27/19 2214 09/28/19 0636 09/28/19 0636 09/29/19 0501 09/30/19 0837 10/01/19 0903  NA 133*  --  133*  --  133* 138 135  K 3.5  --  3.5  --  4.5 4.3 4.9  CL 96*  --  97*  --  99 98 97*  CO2 27  --  27  --  21* 28 22  GLUCOSE 121*  --  99  --  119* 109* 134*  BUN 39*  --  43*  --  53* 37* 58*  CREATININE 4.37*  --  4.99*  --  6.85* 5.00* 6.69*  CALCIUM 8.5*   < > 8.4*   < > 8.5* 8.1* 8.3*  MG  --   --   --   --  1.9  --   --    < > = values in this interval not displayed.    Liver Function Tests: Recent Labs  Lab 09/27/19 2214  AST 36  ALT 17  ALKPHOS 56  BILITOT 0.3  PROT 7.5  ALBUMIN 2.5*   Recent Labs  Lab 09/27/19 2214  LIPASE 63*   No results for input(s): AMMONIA in the last 168 hours.  CBC: Recent Labs  Lab 09/28/19 0636 09/28/19 1404 09/29/19 0501 09/30/19 0837 10/01/19 0903   WBC 15.2* 16.8* 11.6* 12.6* 14.2*  HGB 6.1* 7.5* 6.7* 9.7* 8.9*  HCT 19.9* 23.1* 22.0* 29.9* 27.6*  MCV 96.1 88.2 91.3 89.3 89.9  PLT 239 236 298 269 289    Cardiac Enzymes: No results for input(s): CKTOTAL, CKMB, CKMBINDEX, TROPONINI in the last 168 hours.  BNP: Invalid input(s): POCBNP  CBG: Recent Labs  Lab 09/30/19 1631 09/30/19 2054 10/01/19 0046 10/01/19 0721 10/01/19 0747  GLUCAP 117* 105* 114* 116* 120*    Microbiology: Results for orders placed or performed during the hospital encounter of 09/27/19  SARS CORONAVIRUS 2 (TAT 6-24 HRS) Nasopharyngeal Nasopharyngeal Swab     Status: None   Collection Time: 09/28/19  1:40 AM   Specimen: Nasopharyngeal Swab  Result Value Ref Range Status   SARS Coronavirus 2 NEGATIVE NEGATIVE Final    Comment: (NOTE) SARS-CoV-2 target nucleic acids are NOT DETECTED. The SARS-CoV-2 RNA is generally detectable in upper and lower respiratory specimens during the acute phase of infection. Negative results do  not preclude SARS-CoV-2 infection, do not rule out co-infections with other pathogens, and should not be used as the sole basis for treatment or other patient management decisions. Negative results must be combined with clinical observations, patient history, and epidemiological information. The expected result is Negative. Fact Sheet for Patients: SugarRoll.be Fact Sheet for Healthcare Providers: https://www.woods-mathews.com/ This test is not yet approved or cleared by the [place] FDA and  has been authorized for detection and/or diagnosis of SARS-CoV-2 by FDA under an Emergency Use Authorization (EUA). This EUA will remain  in effect (meaning this test can be used) for the duration of the COVID-19 declaration under Section 56 4(b)(1) of the Act, 21 U.S.C. section 360bbb-3(b)(1), unless the authorization is terminated or revoked sooner. Performed at [facility], [place] [address], [place], [place] [address]   MRSA PCR Screening     Status: None   Collection Time: 09/28/19  3:08 AM   Specimen: Nasal Mucosa; Nasopharyngeal  Result Value Ref Range Status   MRSA by PCR NEGATIVE NEGATIVE Final    Comment:        The GeneXpert MRSA Assay (FDA approved for NASAL specimens only), is one component of a comprehensive MRSA colonization surveillance program. It is not intended to diagnose MRSA infection nor to guide or monitor treatment for MRSA infections. Performed at [facility], [address] [place], [place] [address]     Coagulation Studies: No results for input(s): LABPROT, INR in the last 72 hours.  Urinalysis: No results for input(s): COLORURINE, LABSPEC, PHURINE, GLUCOSEU, HGBUR, BILIRUBINUR, KETONESUR, PROTEINUR, UROBILINOGEN, NITRITE, LEUKOCYTESUR in the last 72 hours.  Invalid input(s): APPERANCEUR    Imaging: No results found.   Medications:    . B-complex with vitamin C  1 tablet Per Tube QHS  . doxycycline  100 mg Per Tube Q12H  . epoetin (EPOGEN/PROCRIT) injection  10,000 Units Intravenous Q M,W,F-HD  . feeding supplement (NEPRO CARB STEADY)  1,000 mL Per Tube Q24H  . feeding supplement (PRO-STAT SUGAR FREE 64)  30 mL Per Tube BID  . free water  50 mL Per Tube 6 X Daily  . gabapentin  100 mg Per Tube TID  . lacosamide  100 mg Per Tube BID  . pantoprazole (PROTONIX) IV  40 mg Intravenous Q12H     Assessment/ Plan:  [name] is a 79 y.o. black female with end stage renal disease on hemodialysis, hypertension, diabetes mellitus type II, diabetic neuropathy, seizure disorder, left hip repair, anemia of chronic kidney disease, secondary hyperparathyroidism  [facility] MWF [facility] LUE AVG 79.5kg   1.  ESRD on HD MWF.  Continue MWF schedule.    2. Anemia of chronic kidney disease.  With GI bleed. Hemoglobin 8.9. Requiring blood transfusion on admission.  - Continue EPO - pantoprazole IV   3.   Secondary hyperparathyroidism.  Holding binders.   4. Hypertension: 163/66. Holding blood pressure agents. Home regimen of amlodipine.   5. Encephalopathy: recommend reduction in gabapentin to 129m daily.    LOS: 3 Barbara [name] 3/14/202111:56 AM

## 2019-10-01 NOTE — Plan of Care (Signed)
Patient is alert to self only and is tolerating tube feedings well.  Will continue to monitor.  Barbara [name]  10/01/2019  3:28 AM

## 2019-10-02 ENCOUNTER — Inpatient Hospital Stay: Payer: Medicare Other

## 2019-10-02 LAB — CBC
HCT: 27.3 % — ABNORMAL LOW (ref 36.0–46.0)
Hemoglobin: 8.6 g/dL — ABNORMAL LOW (ref 12.0–15.0)
MCH: 29.1 pg (ref 26.0–34.0)
MCHC: 31.5 g/dL (ref 30.0–36.0)
MCV: 92.2 fL (ref 80.0–100.0)
Platelets: 315 10*3/uL (ref 150–400)
RBC: 2.96 MIL/uL — ABNORMAL LOW (ref 3.87–5.11)
RDW: 17.4 % — ABNORMAL HIGH (ref 11.5–15.5)
WBC: 16.8 10*3/uL — ABNORMAL HIGH (ref 4.0–10.5)
nRBC: 0 % (ref 0.0–0.2)

## 2019-10-02 LAB — GLUCOSE, CAPILLARY
Glucose-Capillary: 111 mg/dL — ABNORMAL HIGH (ref 70–99)
Glucose-Capillary: 120 mg/dL — ABNORMAL HIGH (ref 70–99)
Glucose-Capillary: 126 mg/dL — ABNORMAL HIGH (ref 70–99)
Glucose-Capillary: 127 mg/dL — ABNORMAL HIGH (ref 70–99)
Glucose-Capillary: 146 mg/dL — ABNORMAL HIGH (ref 70–99)
Glucose-Capillary: 163 mg/dL — ABNORMAL HIGH (ref 70–99)
Glucose-Capillary: 180 mg/dL — ABNORMAL HIGH (ref 70–99)

## 2019-10-02 LAB — RENAL FUNCTION PANEL
Albumin: 2.3 g/dL — ABNORMAL LOW (ref 3.5–5.0)
Anion gap: 12 (ref 5–15)
BUN: 71 mg/dL — ABNORMAL HIGH (ref 8–23)
CO2: 27 mmol/L (ref 22–32)
Calcium: 8.5 mg/dL — ABNORMAL LOW (ref 8.9–10.3)
Chloride: 97 mmol/L — ABNORMAL LOW (ref 98–111)
Creatinine, Ser: 8.21 mg/dL — ABNORMAL HIGH (ref 0.44–1.00)
GFR calc Af Amer: 5 mL/min — ABNORMAL LOW (ref >60)
GFR calc non Af Amer: 4 mL/min — ABNORMAL LOW (ref >60)
Glucose, Bld: 168 mg/dL — ABNORMAL HIGH (ref 70–99)
Phosphorus: 3.2 mg/dL (ref 2.5–4.6)
Potassium: 3.7 mmol/L (ref 3.5–5.1)
Sodium: 136 mmol/L (ref 135–145)

## 2019-10-02 LAB — FIBRIN DERIVATIVES D-DIMER (ARMC ONLY): Fibrin derivatives D-dimer (ARMC): >7500 ng/mL (FEU) — ABNORMAL HIGH (ref 0.00–499.00)

## 2019-10-02 LAB — MAGNESIUM: Magnesium: 2 mg/dL (ref 1.7–2.4)

## 2019-10-02 MED ORDER — SODIUM CHLORIDE 0.9 % IV SOLN
1.0000 g | INTRAVENOUS | Status: DC
  Administered 2019-10-02 – 2019-10-05 (×4): 1 g via INTRAVENOUS
  Filled 2019-10-02: qty 10
  Filled 2019-10-02: qty 1
  Filled 2019-10-02: qty 10
  Filled 2019-10-02 (×2): qty 1

## 2019-10-02 MED ORDER — BUDESONIDE 0.5 MG/2ML IN SUSP
0.5000 mg | Freq: Two times a day (BID) | RESPIRATORY_TRACT | Status: DC
  Administered 2019-10-02 – 2019-10-05 (×7): 0.5 mg via RESPIRATORY_TRACT
  Filled 2019-10-02 (×8): qty 2

## 2019-10-02 MED ORDER — METHYLPREDNISOLONE SODIUM SUCC 125 MG IJ SOLR
60.0000 mg | Freq: Once | INTRAMUSCULAR | Status: AC
  Administered 2019-10-02: 60 mg via INTRAVENOUS
  Filled 2019-10-02: qty 2

## 2019-10-02 MED ORDER — IPRATROPIUM BROMIDE 0.02 % IN SOLN
0.5000 mg | Freq: Four times a day (QID) | RESPIRATORY_TRACT | Status: DC
  Administered 2019-10-02 – 2019-10-05 (×13): 0.5 mg via RESPIRATORY_TRACT
  Filled 2019-10-02 (×16): qty 2.5

## 2019-10-02 MED ORDER — LEVALBUTEROL HCL 0.63 MG/3ML IN NEBU
0.6300 mg | INHALATION_SOLUTION | Freq: Four times a day (QID) | RESPIRATORY_TRACT | Status: DC
  Administered 2019-10-02 – 2019-10-05 (×12): 0.63 mg via RESPIRATORY_TRACT
  Filled 2019-10-02 (×20): qty 3

## 2019-10-02 MED ORDER — CHLORHEXIDINE GLUCONATE CLOTH 2 % EX PADS
6.0000 | MEDICATED_PAD | Freq: Every day | CUTANEOUS | Status: DC
  Administered 2019-10-03 – 2019-10-09 (×6): 6 via TOPICAL

## 2019-10-02 MED ORDER — RACEPINEPHRINE HCL 2.25 % IN NEBU
0.5000 mL | INHALATION_SOLUTION | Freq: Once | RESPIRATORY_TRACT | Status: AC
  Administered 2019-10-02: 0.5 mL via RESPIRATORY_TRACT
  Filled 2019-10-02: qty 0.5

## 2019-10-02 MED ORDER — ORAL CARE MOUTH RINSE
15.0000 mL | Freq: Two times a day (BID) | OROMUCOSAL | Status: DC
  Administered 2019-10-02 – 2019-10-09 (×14): 15 mL via OROMUCOSAL

## 2019-10-02 MED ORDER — METHYLPREDNISOLONE SODIUM SUCC 125 MG IJ SOLR
60.0000 mg | Freq: Four times a day (QID) | INTRAMUSCULAR | Status: DC
  Administered 2019-10-02 – 2019-10-06 (×15): 60 mg via INTRAVENOUS
  Filled 2019-10-02 (×16): qty 2

## 2019-10-02 MED ORDER — ACETAMINOPHEN 160 MG/5ML PO SOLN
650.0000 mg | Freq: Four times a day (QID) | ORAL | Status: DC | PRN
  Administered 2019-10-07 – 2019-10-09 (×3): 650 mg via ORAL
  Filled 2019-10-02 (×2): qty 20.3
  Filled 2019-10-02: qty 40.6
  Filled 2019-10-02 (×2): qty 20.3

## 2019-10-02 MED ORDER — RACEPINEPHRINE HCL 2.25 % IN NEBU
0.5000 mL | INHALATION_SOLUTION | Freq: Once | RESPIRATORY_TRACT | Status: DC
  Filled 2019-10-02 (×2): qty 0.5

## 2019-10-02 NOTE — Progress Notes (Signed)
Hemodialysis patient known at [facility] MWF 6:00.

## 2019-10-02 NOTE — Progress Notes (Signed)
Tx ended

## 2019-10-02 NOTE — Progress Notes (Signed)
[facility] Progress Note  Patient: Barbara [name]    FAO:[id number]  DOA: 09/27/2019     Date of Service: the patient was seen and examined on 10/02/2019  Chief Complaint  Patient presents with  . Hematemesis   Brief hospital course: TRINI SOLDO is a 79 y.o. female with medical history significant for ESRD on HD MWF, hyperlipidemia, type 2 diabetes, COPD, asthma, GERD, bladder cancer, and iron deficiency anemia who presents for concerns of acute GI bleed and anemia. SP EGD and replacement of the G-tube.  In last 24 hours patient developed respiratory failure with bilateral wheezes currently being transferred to stepdown unit. Currently further plan is continue treat respiratory failure and monitor for stability transferring to SNF  Assessment and Plan: Acute GI bleed Hemoglobin of 4.8 with a baseline of around 8-9. Needed central line placement given difficulty with peripheral line access Transfused 2 units, hemoglobin low again on 09/29/2019 SP 1 more PRBC transfusion. Continue PPI TWICE daily, Appreciate GI assistance.  SP EGD.  Acute blood loss anemia. Anemia of chronic kidney disease Presented with GI bleed.  Hemoglobin dropped down to 4.8.  Transfused 3 PRBC.  Currently H&H is stable No further bleeding.  Monitor.  PEG tube dependency with dysphagia Placed on 09/20/2019 due to dysphagia Appreciate GI assistance for replacement of the G-tube. GI recommended to resume diet. Currently tolerating well.  Acute hypoxic respiratory failure Acute on chronic systolic CHF. Acute bronchitis. Inspiratory wheezes. Noted by nursing that she chronically is on 2L but this is not noted in last discharge note. Patient currently requiring 6 L of oxygen to maintain adequate saturation. ABG performed on 6 L shows PO2 of 60% and a saturation of 92.6%. No hypercarbia. We will transfer the patient to the stepdown unit for close observation. Patient was given 120 mg of IV Solu-Medrol. We  will continue with Solu-Medrol 60 mg every 6 hours. Antibiotic changed to IV ceftriaxone. CCM consider further assistance. BiPAP as needed.  Leukocytosis Concern for cellulitis This appears to have been chronic for the past week.  No signs of fever.  Continue IV antibiotics  ESRD on HD MWF Creatinine is stable Need to monitor for any fluid overload with blood transfusion Appreciate nephrology assistance. Patient will require to sit in the chair.  Chronic diastolic CHF:  Hemodialysis for fluid management  Type 2 diabetes controlled. diet-controlled  Currently on sliding scale insulin. Also on Nepro for tube feeding. Monitor  Hx of seizure Continue home regimen per tube.  Stage 1 sacral and buttock ulcers  Wound care per RN   Dysphagia. Patient is significantly awake and alert. Speech therapy consulted for assessment for the diet. Per speech therapy patient was not eating enough likely from not being hungry while she was on tube feeding. Given lethargy currently holding off on further work-up.  Appreciate speech therapy recommendation.  Acute encephalopathy.  Likely toxic. From medication.  Received fentanyl early this morning. Continues to report severe right leg cramps.  And given Robaxin. Continue to monitor for now.  Body mass index is 27.93 kg/m.  Nutrition Problem: Inadequate oral intake Etiology: dysphagia Interventions: Interventions: Tube feeding, Prostat, MVI  Pressure Injury 09/21/19 Buttocks Right Stage 2 -  Partial thickness loss of dermis presenting as a shallow open injury with a red, pink wound bed without slough. 0.5cmx0.5cm (Active)  09/21/19 0830  Location: Buttocks  Location Orientation: Right  Staging: Stage 2 -  Partial thickness loss of dermis presenting as a shallow open injury with  a red, pink wound bed without slough.  Wound Description (Comments): 0.5cmx0.5cm  Present on Admission: No     Diet: Currently n.p.o.  On tube feeding.  DVT Prophylaxis: SCD, pharmacological prophylaxis contraindicated due to GI bleed   Advance goals of care discussion: Full code  Family Communication: family was present at bedside, at the time of interview.  Also discussed with daughter on the phone. The pt provided permission to discuss medical plan with the family. Opportunity was given to ask question and all questions were answered satisfactorily.   Disposition:  Pt is from SNF, admitted with upper GI bleed from G-tube malfunction, still significant hypoxia requiring 6 L of oxygen, which precludes a safe discharge. Discharge to SNF, when medically stable  Subjective: Confusion still present.  Patient is burping more and more short of breath.  No nausea no vomiting.  Initially was not able to speak but now is able to speak somewhat after receiving breathing treatments  Physical Exam: General:  alert oriented to person.  Appear in marked distress, affect appropriate Eyes: PERRL ENT: Oral Mucosa Clear, dry Neck: difficult to assess JVD,  Cardiovascular: S1 and S2 Present, no Murmur,  Respiratory: good respiratory effort, Bilateral Air entry equal and Decreased, no Crackles, inspiratory as well as expiratory wheezes primarily seen more and upper airway area, Abdomen: Bowel Sound present, Soft and no tenderness,  Skin: no rash Extremities: no Pedal edema, no calf tenderness Neurologic: without any new focal findings  Gait not checked due to patient safety concerns  Vitals:   10/02/19 1400 10/02/19 1401 10/02/19 1812 10/02/19 1814  BP: (!) 151/66  128/62   Pulse: (!) 106 98 (!) 105 (!) 104  Resp: 20  (!) 23 (!) 22  Temp: 98.7 F (37.1 C)  98.6 F (37 C)   TempSrc: Oral  Oral   SpO2: 97%  94% 91%  Weight:      Height:        Intake/Output Summary (Last 24 hours) at 10/02/2019 1920 Last data filed at 10/02/2019 1603 Gross per 24 hour  Intake 10.66 ml  Output -  Net 10.66 ml   Filed Weights   09/30/19 0500 10/01/19 0510  10/02/19 0930  Weight: 77.8 kg 84.5 kg 78.5 kg    Data Reviewed: I have personally reviewed and interpreted daily labs, tele strips, imagings as discussed above. I reviewed all nursing notes, pharmacy notes, vitals, pertinent old records I have discussed plan of care as described above with RN and patient/family.  CBC: Recent Labs  Lab 09/28/19 1404 09/29/19 0501 09/30/19 0837 10/01/19 0903 10/02/19 0500  WBC 16.8* 11.6* 12.6* 14.2* 16.8*  HGB 7.5* 6.7* 9.7* 8.9* 8.6*  HCT 23.1* 22.0* 29.9* 27.6* 27.3*  MCV 88.2 91.3 89.3 89.9 92.2  PLT 236 298 269 289 263   Basic Metabolic Panel: Recent Labs  Lab 09/28/19 0636 09/29/19 0501 09/30/19 0837 10/01/19 0903 10/02/19 0500 10/02/19 0930  NA 133* 133* 138 135  --  136  K 3.5 4.5 4.3 4.9  --  3.7  CL 97* 99 98 97*  --  97*  CO2 27 21* 28 22  --  27  GLUCOSE 99 119* 109* 134*  --  168*  BUN 43* 53* 37* 58*  --  71*  CREATININE 4.99* 6.85* 5.00* 6.69*  --  8.21*  CALCIUM 8.4* 8.5* 8.1* 8.3*  --  8.5*  MG  --  1.9  --   --  2.0  --   PHOS  --   --   --   --   --  3.2    Studies: No results found.  Scheduled Meds: . amLODipine  5 mg Per Tube Daily  . B-complex with vitamin C  1 tablet Per Tube QHS  . Chlorhexidine Gluconate Cloth  6 each Topical Daily  . epoetin (EPOGEN/PROCRIT) injection  10,000 Units Intravenous Q M,W,F-HD  . feeding supplement (NEPRO CARB STEADY)  1,000 mL Per Tube Q24H  . feeding supplement (PRO-STAT SUGAR FREE 64)  30 mL Per Tube BID  . free water  50 mL Per Tube 6 X Daily  . gabapentin  100 mg Per Tube Daily  . ipratropium  0.5 mg Nebulization Q6H  . lacosamide  100 mg Per Tube BID  . levalbuterol  0.63 mg Nebulization Q6H  . mouth rinse  15 mL Mouth Rinse BID  . methylPREDNISolone (SOLU-MEDROL) injection  60 mg Intravenous Q6H   Continuous Infusions: . cefTRIAXone (ROCEPHIN)  IV 200 mL/hr at 10/02/19 1603   PRN Meds: acetaminophen (TYLENOL) oral liquid 160 mg/5 mL, albuterol, diclofenac  Sodium  Time spent: 35 minutes  Author: Berle Mull, MD [facility] 10/02/2019 7:20 PM  To reach On-call, see care teams to locate the attending and reach out to them via [email or web address]. If 7PM-7AM, please contact night-coverage If you still have difficulty reaching the attending provider, please page the [facility] (Director on Call) for [facility] on amion for assistance.

## 2019-10-02 NOTE — TOC Progression Note (Signed)
Transition of Care [facility]) - Progression Note    Patient Details  Name: Barbara [name] MRN: [id number] Date of Birth: Aug 23, 1940  [facility]) CM/SW Contact  [name], RN Phone Number: 10/02/2019, 2:31 PM  Clinical Narrative:     Husband in agreement to discharge to [facility] when medically stable. RNCM attempted to discussed with patient however she just shook her head.   RNCM obtained insurance approval.  Auth # [id number].  Reference # [id number].  Tina at [facility] notified  Per MD anticipated discharge tomorrow.  Husband and [facility] aware.   Expected Discharge Plan: [place] Barriers to Discharge: Continued Medical Work up  Expected Discharge Plan and Services Expected Discharge Plan: [place] arrangements for the past 2 months: [place]                                       Social Determinants of Health (SDOH) Interventions    Readmission Risk Interventions Readmission Risk Prevention Plan 09/29/2019  Transportation Screening Complete  Medication Review (RN Care Manager) Complete  Palliative Care Screening Not Applicable  Some recent data might be hidden

## 2019-10-02 NOTE — Care Management Important Message (Signed)
Important Message  Patient Details  Name: Barbara [name] MRN: [id number] Date of Birth: October 16, 1940   Medicare Important Message Given:  Yes     [name] 10/02/2019, 12:26 PM

## 2019-10-02 NOTE — Progress Notes (Addendum)
SLP Cancellation Note  Patient Details Name: Barbara [name] MRN: [id number] DOB: 06-19-1941   Cancelled treatment:       Reason Eval/Treat Not Completed: SLP screened, no needs identified, will sign off(chart reviewed; consulted MD re: pt's status). Due to pt's current presentation including waxing and waning of alertness and lethargy, therapeutic po trials will be held until pt returns to her SNF. There, pt can f/u w/ skilled ST services for ongoing therapeutic trials of purees, nectar liquids as was initiated when pt received her PEG placement for nutrition/hydration support(d/t FTT/reduced oral intake). Any po's would be for [place] d/t PEG TFs meeting her primary needs. Aspiration precautions and frequent oral care for hygiene and stimulation of swallowing is recommended. Reflux precautions secondary to PEG TFs. MD agreed. NSG updated.     [name], MS, CCC-SLP Nellie Pester 10/02/2019, 5:43 PM

## 2019-10-02 NOTE — Progress Notes (Signed)
PT Cancellation Note  Patient Details Name: SERI KIMMER MRN: [id number] DOB: [birth year]   Cancelled Treatment:    Reason Eval/Treat Not Completed: Other (comment). Pt currently off floor for HD at this time. Unavailable for therapy. Will re-attempt next date.   Nixie Laube 10/02/2019, 10:14 AM Greggory Stallion, PT, DPT [phone]

## 2019-10-02 NOTE — Progress Notes (Signed)
HD started

## 2019-10-02 NOTE — Progress Notes (Signed)
Per MD okay for RN to DC racepinephrine order. Pt lungs sounds sound better. Also okay to order liquid tylenol. RN wil contunue to assess and monitor pt.

## 2019-10-02 NOTE — ED Provider Notes (Signed)
[facility] Emergency Department Provider Note  ____________________________________________   I have reviewed the triage vital signs and the nursing notes.   HISTORY  Chief Complaint Hematemesis   History limited by: Not Limited   HPI Barbara [name] is a 79 y.o. female who presents to the emergency department today from [facility] because of concern for possible GI bleed. Apparently staff has noticed the patient has had some bloody vomit for the past two days. The patient recently had a prolonged hospitalization after developing acute respiratory distress after orthopedic hip surgery. They did check her blood level today at [facility] and she was found to be anemic. The patient herself cannot give good history as to why she is here in the emergency department.    Records reviewed. Per medical record review patient has a history of recent prolonged hospitalization.   Past Medical History:  Diagnosis Date  . Adult celiac disease   . Anemia associated with chronic renal failure 2017   blood transfusion last week 10/17  . Arthritis   . Asthma   . 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 Chronic kidney disease   . 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  dr. Candiss Norse and dr. Holley Raring follow her  . COPD (chronic obstructive pulmonary disease) ([place])   . Coronary artery disease   . Diabetes mellitus without complication ([place])   . Dialysis patient ([place])    Tues, Thurs, Sat  . Dyspnea    with exertion  . Elevated lipids   . GERD (gastroesophageal reflux disease)   . Hematuria   . Hypertension   . Lower back pain   . Neuropathy   . Oxygen dependent    at hs  . Personal history of chemotherapy 2017   bladder ca  . Personal history of radiation therapy 2017   bladder ca  . Sleep apnea    uses cpap  . Urinary obstruction 01/2016    Patient Active Problem List   Diagnosis Date Noted  . Acute GI bleeding 09/28/2019  . Acute respiratory  failure with hypoxia ([place]) 09/28/2019  . Chronic diastolic CHF (congestive heart failure) ([place]) 09/28/2019  . Seizure ([place]) 09/28/2019  . PEG tube malfunction ([place]) 09/28/2019  . Sacral ulcer ([place]) 09/23/2019  . Dysphagia 09/19/2019  . Acute encephalopathy   . Goals of care, counseling/discussion   . Aspiration pneumonia ([place]) 09/11/2019  . Status post total hip replacement, left 09/05/2019  . Type II diabetes mellitus with renal manifestations ([place]) 09/05/2019  . ESRD on dialysis ([place]) 09/05/2019  . Bacteremia due to other bacteria   . Bacteroides fragilis infection   . Acute metabolic encephalopathy   . Somnolence   . Lobar pneumonia ([place]) 05/11/2019  . Dialysis patient ([place])   . Primary osteoarthritis of left hip 04/07/2019  . Metabolic acidosis 15/11/6977  . Complication from renal dialysis device 01/28/2018  . History of colonic polyps 07/30/2017  . Presence of cardiac and vascular implant and graft   . Fever   . Sepsis ([place])   . ESRD (end stage renal disease) ([place]) 02/01/2017  . Sepsis secondary to UTI ([place]) 01/24/2017  . Acute kidney injury ([place]) 11/19/2016  . Anemia, chronic renal failure, stage 4 (severe) ([place]) 09/17/2016  . Hypoglycemia 06/04/2016  . Hyperkalemia 05/12/2016  . Protein-calorie malnutrition, severe 05/05/2016  . Acute on chronic renal failure ([place]) 05/04/2016  . Convulsions ([place]) 04/17/2016  . Palliative care by specialist   .  DNR (do not resuscitate) discussion   . C. difficile diarrhea 03/11/2016  . Anemia 03/11/2016  . Hypotension 03/11/2016  . Acidosis 03/11/2016  . Failure to thrive (child) 03/11/2016  . Weakness generalized 03/11/2016  . ARF (acute renal failure) ([place]) 03/04/2016  . 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 Absolute anemia 02/04/2016  . Airway hyperreactivity 02/04/2016  . Celiac disease 02/04/2016  . Gastric catarrh 02/04/2016  . Acid reflux 02/04/2016  . Combined fat and carbohydrate induced hyperlipemia  02/04/2016  . C. difficile colitis 01/22/2016  . Urinary obstruction 01/21/2016  . COPD (chronic obstructive pulmonary disease) ([place]) 01/21/2016  . Controlled type 2 diabetes mellitus with stage 4 chronic kidney disease, with long-term current use of insulin ([place]) 01/21/2016  . Essential hypertension 01/21/2016  . Acute renal failure superimposed on stage 4 chronic kidney disease ([place]) 01/20/2016  . Leukocytosis 01/20/2016  . Anemia in chronic kidney disease 01/20/2016  . Arthritis of knee, degenerative 05/10/2015  . Knee strain 03/26/2015  . Other intervertebral disc displacement, lumbar region 03/04/2015  . Degenerative arthritis of lumbar spine 03/04/2015  . HNP (herniated nucleus pulposus), lumbar 03/04/2015  . Osteoarthritis of spine with radiculopathy, lumbar region 03/04/2015  . Injury of tendon of upper extremity 11/12/2014  . Atherosclerosis of abdominal aorta ([place]) 11/02/2014  . Chronic kidney disease, stage IV (severe) ([place]) 11/02/2014  . Obstructive apnea 11/02/2014  . Complete rotator cuff rupture of left shoulder 10/05/2014  . Arthritis of shoulder region, degenerative 10/05/2014  . CAD in native artery 12/08/2013  . Benign essential HTN 12/08/2013  . Type 2 diabetes mellitus ([place]) 12/08/2013    Past Surgical History:  Procedure Laterality Date  . A/V SHUNT INTERVENTION N/A 05/12/2018   Procedure: A/V SHUNT INTERVENTION;  Surgeon: Algernon Huxley, MD;  Location: [place] [facility];  Service: Cardiovascular;  Laterality: N/A;  . A/V SHUNT INTERVENTION Left 07/29/2018   Procedure: A/V SHUNT INTERVENTION;  Surgeon: Katha Cabal, MD;  Location: [place] [facility];  Service: Cardiovascular;  Laterality: Left;  . ABDOMINAL HYSTERECTOMY    . AV FISTULA PLACEMENT Left 02/17/2017   Procedure: INSERTION OF ARTERIOVENOUS (AV) GORE-TEX GRAFT ARM(BRACHIAL AXILLARY);  Surgeon: Katha Cabal, MD;  Location: [facility];  Service: Vascular;  Laterality: Left;  . CYSTOSCOPY W/  RETROGRADES Bilateral 02/17/2016   Procedure: CYSTOSCOPY WITH RETROGRADE PYELOGRAM;  Surgeon: Hollice Espy, MD;  Location: [facility];  Service: Urology;  Laterality: Bilateral;  . CYSTOSCOPY W/ RETROGRADES Bilateral 10/12/2016   Procedure: CYSTOSCOPY WITH RETROGRADE PYELOGRAM;  Surgeon: Hollice Espy, MD;  Location: [facility];  Service: Urology;  Laterality: Bilateral;  . CYSTOSCOPY W/ RETROGRADES Bilateral 06/09/2017   Procedure: CYSTOSCOPY WITH RETROGRADE PYELOGRAM;  Surgeon: Hollice Espy, MD;  Location: [facility];  Service: Urology;  Laterality: Bilateral;  . CYSTOSCOPY W/ RETROGRADES Bilateral 10/07/2017   Procedure: CYSTOSCOPY WITH RETROGRADE PYELOGRAM;  Surgeon: Hollice Espy, MD;  Location: [facility];  Service: Urology;  Laterality: Bilateral;  . CYSTOSCOPY W/ URETERAL STENT PLACEMENT Left 05/12/2016   Procedure: CYSTOSCOPY WITH STENT REPLACEMENT;  Surgeon: Hollice Espy, MD;  Location: [facility];  Service: Urology;  Laterality: Left;  . CYSTOSCOPY W/ URETERAL STENT PLACEMENT Left 10/12/2016   Procedure: CYSTOSCOPY WITH STENT REPLACEMENT;  Surgeon: Hollice Espy, MD;  Location: [facility];  Service: Urology;  Laterality: Left;  . CYSTOSCOPY WITH BIOPSY N/A 06/09/2017   Procedure: CYSTOSCOPY WITH BLADDER BIOPSY;  Surgeon: Hollice Espy, MD;  Location: [facility];  Service: Urology;  Laterality: N/A;  .  CYSTOSCOPY WITH STENT PLACEMENT Left 01/21/2016   Procedure: CYSTOSCOPY WITH double J STENT PLACEMENT;  Surgeon: Franchot Gallo, MD;  Location: [facility];  Service: Urology;  Laterality: Left;  . CYSTOSCOPY WITH STENT PLACEMENT Right 10/12/2016   Procedure: CYSTOSCOPY WITH STENT PLACEMENT;  Surgeon: Hollice Espy, MD;  Location: [facility];  Service: Urology;  Laterality: Right;  . DIALYSIS/PERMA CATHETER INSERTION N/A 11/20/2016   Procedure: Dialysis/Perma Catheter Insertion;  Surgeon: Algernon Huxley, MD;  Location: [place] [facility];  Service: Cardiovascular;  Laterality: N/A;  . DIALYSIS/PERMA  CATHETER REMOVAL N/A 03/30/2017   Procedure: DIALYSIS/PERMA CATHETER REMOVAL;  Surgeon: Katha Cabal, MD;  Location: [place] [facility];  Service: Cardiovascular;  Laterality: N/A;  . ESOPHAGOGASTRODUODENOSCOPY (EGD) WITH PROPOFOL N/A 09/28/2019   Procedure: ESOPHAGOGASTRODUODENOSCOPY (EGD) WITH PROPOFOL;  Surgeon: Toledo, Benay Pike, MD;  Location: [facility];  Service: Gastroenterology;  Laterality: N/A;  . KIDNEY SURGERY  01/21/2016   IR NEPHROSTOMY PLACEMENT LEFT   . PEG PLACEMENT N/A 09/20/2019   Procedure: PERCUTANEOUS ENDOSCOPIC GASTROSTOMY (PEG) PLACEMENT;  Surgeon: Toledo, Benay Pike, MD;  Location: [facility];  Service: Gastroenterology;  Laterality: N/A;  . PERIPHERAL VASCULAR CATHETERIZATION N/A 04/07/2016   Procedure: Glori Luis Cath Insertion;  Surgeon: Katha Cabal, MD;  Location: [place] [facility];  Service: Cardiovascular;  Laterality: N/A;  . PERIPHERAL VASCULAR THROMBECTOMY Left 06/02/2017   Procedure: PERIPHERAL VASCULAR THROMBECTOMY;  Surgeon: Algernon Huxley, MD;  Location: [place] [facility];  Service: Cardiovascular;  Laterality: Left;  . PERIPHERAL VASCULAR THROMBECTOMY Left 01/28/2018   Procedure: PERIPHERAL VASCULAR THROMBECTOMY;  Surgeon: Katha Cabal, MD;  Location: [place] [facility];  Service: Cardiovascular;  Laterality: Left;  . PERIPHERAL VASCULAR THROMBECTOMY Left 03/14/2018   Procedure: PERIPHERAL VASCULAR THROMBECTOMY;  Surgeon: Algernon Huxley, MD;  Location: [place] [facility];  Service: Cardiovascular;  Laterality: Left;  . PERIPHERAL VASCULAR THROMBECTOMY Left 03/16/2018   Procedure: PERIPHERAL VASCULAR THROMBECTOMY;  Surgeon: Katha Cabal, MD;  Location: [place] [facility];  Service: Cardiovascular;  Laterality: Left;  . PORTA CATH REMOVAL N/A 02/07/2019   Procedure: PORTA CATH REMOVAL;  Surgeon: Katha Cabal, MD;  Location: [place] [facility];  Service: Cardiovascular;  Laterality: N/A;  . PORTACATH PLACEMENT Right   .  ROTATOR CUFF REPAIR Left   . TEMPORARY DIALYSIS CATHETER N/A 05/11/2018   Procedure: TEMPORARY DIALYSIS CATHETER;  Surgeon: Katha Cabal, MD;  Location: [place] [facility];  Service: Cardiovascular;  Laterality: N/A;  . TEMPORARY DIALYSIS CATHETER Left 07/27/2018   Procedure: TEMPORARY DIALYSIS CATHETER;  Surgeon: Katha Cabal, MD;  Location: [place] [facility];  Service: Cardiovascular;  Laterality: Left;  . TOTAL HIP ARTHROPLASTY Left 09/05/2019   Procedure: TOTAL HIP ARTHROPLASTY;  Surgeon: Corky Mull, MD;  Location: [facility];  Service: Orthopedics;  Laterality: Left;  . TRANSURETHRAL RESECTION OF BLADDER TUMOR N/A 02/17/2016   Procedure: TRANSURETHRAL RESECTION OF BLADDER TUMOR (TURBT)-LARGE;  Surgeon: Hollice Espy, MD;  Location: [facility];  Service: Urology;  Laterality: N/A;  . TRANSURETHRAL RESECTION OF BLADDER TUMOR N/A 10/07/2017   Procedure: TRANSURETHRAL RESECTION OF BLADDER TUMOR (TURBT)-small;  Surgeon: Hollice Espy, MD;  Location: [facility];  Service: Urology;  Laterality: N/A;  . URETEROSCOPY Left 02/17/2016   Procedure: URETEROSCOPY;  Surgeon: Hollice Espy, MD;  Location: [facility];  Service: Urology;  Laterality: Left;  . URETEROSCOPY Right 10/12/2016   Procedure: URETEROSCOPY;  Surgeon: Hollice Espy, MD;  Location: [facility];  Service: Urology;  Laterality: Right;    Prior to Admission medications   Medication Sig Start Date End Date Taking? Authorizing Provider  albuterol (PROVENTIL HFA;VENTOLIN HFA) 108 (90 Base) MCG/ACT inhaler Inhale 2 puffs into the lungs every 6 (six) hours as needed for wheezing or shortness of breath.  05/16/18  Yes [provider]  Amino Acids-Protein Hydrolys (FEEDING SUPPLEMENT, PRO-STAT SUGAR FREE 64,) LIQD Place 30 mLs into feeding tube 2 (two) times daily. 09/24/19  Yes Nolberto Hanlon, MD  amLODipine (NORVASC) 5 MG tablet Place 1 tablet (5 mg total) into feeding tube daily. 09/24/19  Yes Nolberto Hanlon, MD  atorvastatin  (LIPITOR) 40 MG tablet Place 40 mg into feeding tube at bedtime.  05/01/19  Yes [provider]  B Complex-C (B-COMPLEX WITH VITAMIN C) tablet Place 1 tablet into feeding tube at bedtime. 09/24/19  Yes Nolberto Hanlon, MD  Calcium Acetate 667 MG TABS Place 1,334 mg into feeding tube 3 (three) times daily.   Yes [provider]  calcium carbonate (TUMS - DOSED IN MG ELEMENTAL CALCIUM) 500 MG chewable tablet Place 1 tablet into feeding tube at bedtime.    Yes [provider]  cholecalciferol (VITAMIN D) 1000 units tablet Take 1 tablet (1,000 Units total) by mouth daily. 09/24/19  Yes Nolberto Hanlon, MD  epoetin alfa (EPOGEN) 10000 UNIT/ML injection Inject 1 mL (10,000 Units total) into the vein every Monday, Wednesday, and Friday with hemodialysis. 09/25/19  Yes Nolberto Hanlon, MD  esomeprazole (NEXIUM) 40 MG packet 40 mg daily before breakfast. Per tube   Yes [provider]  ferrous sulfate 300 (60 Fe) MG/5ML syrup Place 324 mg into feeding tube at bedtime.   Yes [provider]  gabapentin (NEURONTIN) 300 MG capsule Place 2 capsules (600 mg total) into feeding tube 3 (three) times daily. Patient taking differently: Place 300 mg into feeding tube 3 (three) times daily.  09/24/19  Yes Nolberto Hanlon, MD  heparin 5000 UNIT/ML injection Inject 1 mL (5,000 Units total) into the skin every 12 (twelve) hours. 09/24/19  Yes Nolberto Hanlon, MD  lacosamide 100 MG TABS Place 1 tablet (100 mg total) into feeding tube 2 (two) times daily. 09/24/19  Yes Nolberto Hanlon, MD  multivitamin (RENA-VIT) TABS tablet Place 1 tablet into feeding tube at bedtime. 09/24/19  Yes Nolberto Hanlon, MD  oxybutynin (DITROPAN) 5 MG tablet Place 1 tablet (5 mg total) into feeding tube 3 (three) times daily. 09/24/19  Yes Nolberto Hanlon, MD  OXYGEN Inhale 2 L into the lungs at bedtime.   Yes [provider]  Blood Glucose Monitoring Suppl (ACCU-CHEK AVIVA PLUS) w/Device KIT (USE TO MONITOR BLOOD SUGARS.) 12/30/17    [provider]  NON FORMULARY Place 1 Units into the nose at bedtime. CPAP Time of use 2100-0600    [provider]  Water For Irrigation, Sterile (FREE WATER) SOLN Place 50 mLs into feeding tube every 4 (four) hours. Patient not taking: Reported on 09/27/2019 09/24/19   Nolberto Hanlon, MD    Allergies Glipizide er and Percocet [oxycodone-acetaminophen]  Family History  Problem Relation Age of Onset  . Diabetes Mother   . Cancer Father   . Breast cancer Neg Hx   . Kidney disease Neg Hx     Social History Social History   Tobacco Use  . Smoking status: Former Smoker    Types: Cigarettes    Quit date: 07/20/2002    Years since quitting: 17.2  . Smokeless tobacco: Never Used  . Tobacco comment: 01/22/2016   "  quit smoking many years ago "  Substance Use Topics  . Alcohol use: No  . Drug use: No    Review of Systems Constitutional: No fever/chills Eyes: No visual changes. ENT: No sore throat. Cardiovascular: Denies chest pain. Respiratory: Denies shortness of breath. Gastrointestinal: Denies abdominal pain. Genitourinary: Negative for dysuria. Musculoskeletal: Negative for back pain. Skin: Negative for rash. Neurological: Negative for headaches, focal weakness or numbness.  ____________________________________________   PHYSICAL EXAM:  VITAL SIGNS: ED Triage Vitals  Enc Vitals Group     BP 09/27/19 1914 (!) 104/47     Pulse Rate 09/27/19 1914 76     Resp 09/27/19 1914 19     Temp 09/27/19 1914 98.9 F (37.2 C)     Temp Source 09/27/19 1914 Oral     SpO2 09/27/19 1914 98 %     Weight 09/27/19 1910 173 lb 11.6 oz (78.8 kg)     Height 09/27/19 1910 _0  (1.676 m)     Head Circumference --      [place] --      Pain Score 09/27/19 1910 0   Constitutional: Alert and oriented.  Eyes: Conjunctivae are normal.  ENT      Head: Normocephalic and atraumatic.      Nose: No congestion/rhinnorhea.      Mouth/Throat: Mucous membranes are moist.       Neck: No stridor. Hematological/Lymphatic/Immunilogical: No cervical lymphadenopathy. Cardiovascular: Normal rate, regular rhythm.  No murmurs, rubs, or gallops.  Respiratory: Normal respiratory effort without tachypnea nor retractions. Breath sounds are clear and equal bilaterally. No wheezes/rales/rhonchi. Gastrointestinal: Soft. Feeding tube in place. Grossly melenotic stool. GUIAC positive.  Genitourinary: Deferred Musculoskeletal: Normal range of motion in all extremities. No lower extremity edema. Neurologic:  Awake and alert.  Skin:  Skin is warm, dry and intact. No rash noted. Psychiatric: Mood and affect are normal. Speech and behavior are normal. Patient exhibits appropriate insight and judgment.  ____________________________________________    LABS (pertinent positives/negatives)  BMP na 133, k 3.5, glu 121, cr 4.37 CBC wbc 19.7, hgb 4.8, plt 246  ____________________________________________   EKG  None  ____________________________________________    RADIOLOGY  None  ____________________________________________   PROCEDURES  Procedures  ____________________________________________   INITIAL IMPRESSION / ASSESSMENT AND PLAN / ED COURSE  Pertinent labs & imaging results that were available during my care of the patient were reviewed by me and considered in my medical decision making (see chart for details).   Patient presented to the emergency department today from [facility] because of concern for GI bleed and anemia. Patient did have grossly melanotic stool on exam and was GUAIC positive. Blood work was delayed given difficulty of obtaining access. I did initially get an US guided peripheral ID which drew back blood for testing. However patient did then pull it out. Did discuss with patient importance of blood transfusion given anemia.    ____________________________________________   FINAL CLINICAL IMPRESSION(S) / ED DIAGNOSES  Anemia GI  bleed  Note: This dictation was prepared with Dragon dictation. Any transcriptional errors that result from this process are unintentional     Nance Pear, MD 10/02/19 1514

## 2019-10-02 NOTE — Progress Notes (Signed)
Post hd

## 2019-10-02 NOTE — Progress Notes (Signed)
Pre HD  

## 2019-10-02 NOTE — Progress Notes (Signed)
[facility]  ROUNDING NOTE   Subjective:   Seen and examined on hemodialysis treatment. Complain of hip pain.     HEMODIALYSIS FLOWSHEET:  Blood Flow Rate (mL/min): 300 mL/min Arterial Pressure (mmHg): -120 mmHg Venous Pressure (mmHg): 110 mmHg Transmembrane Pressure (mmHg): 70 mmHg Ultrafiltration Rate (mL/min): 570 mL/min Dialysate Flow Rate (mL/min): 600 ml/min Conductivity: Machine : 14 Conductivity: Machine : 14 Dialysis Fluid Bolus: Normal Saline Bolus Amount (mL): 250 mL     Objective:  Vital signs in last 24 hours:  Temp:  [98.4 F (36.9 C)-100 F (37.8 C)] 98.4 F (36.9 C) (03/15 0925) Pulse Rate:  [100-113] 108 (03/15 1130) Resp:  [18-29] 29 (03/15 1130) BP: (145-174)/(50-81) 165/76 (03/15 1130) SpO2:  [93 %-100 %] 100 % (03/15 0930) Weight:  [78.5 kg] 78.5 kg (03/15 0930)  Weight change:  Filed Weights   09/30/19 0500 10/01/19 0510 10/02/19 0930  Weight: 77.8 kg 84.5 kg 78.5 kg    Intake/Output: I/O last 3 completed shifts: In: 30 [Other:30] Out: 40 [Drains:40]   Intake/Output this shift:  No intake/output data recorded.   Physical Exam: General:  Laying in the bed   HEENT:  Anicteric, moist oral mucous membranes  Lungs:   Normal breathing effort, clear bilateral  Heart:  regular  Abdomen:   Soft, nontender, bowel sounds present  Extremities:  No peripheral edema  Neurologic:  Awake, alert  Skin:  Warm  Access:  Left arm AV graft    Basic Metabolic Panel: Recent Labs  Lab 09/28/19 0636 09/28/19 0636 09/29/19 0501 09/29/19 0501 09/30/19 0837 10/01/19 0903 10/02/19 0500 10/02/19 0930  NA 133*  --  133*  --  138 135  --  136  K 3.5  --  4.5  --  4.3 4.9  --  3.7  CL 97*  --  99  --  98 97*  --  97*  CO2 27  --  21*  --  28 22  --  27  GLUCOSE 99  --  119*  --  109* 134*  --  168*  BUN 43*  --  53*  --  37* 58*  --  71*  CREATININE 4.99*  --  6.85*  --  5.00* 6.69*  --  8.21*  CALCIUM 8.4*   < > 8.5*   < > 8.1* 8.3*   --  8.5*  MG  --   --  1.9  --   --   --  2.0  --   PHOS  --   --   --   --   --   --   --  3.2   < > = values in this interval not displayed.    Liver Function Tests: Recent Labs  Lab 09/27/19 2214 10/02/19 0930  AST 36  --   ALT 17  --   ALKPHOS 56  --   BILITOT 0.3  --   PROT 7.5  --   ALBUMIN 2.5* 2.3*   Recent Labs  Lab 09/27/19 2214  LIPASE 63*   No results for input(s): AMMONIA in the last 168 hours.  CBC: Recent Labs  Lab 09/28/19 1404 09/29/19 0501 09/30/19 0837 10/01/19 0903 10/02/19 0500  WBC 16.8* 11.6* 12.6* 14.2* 16.8*  HGB 7.5* 6.7* 9.7* 8.9* 8.6*  HCT 23.1* 22.0* 29.9* 27.6* 27.3*  MCV 88.2 91.3 89.3 89.9 92.2  PLT 236 298 269 289 315    Cardiac Enzymes: No results for input(s): CKTOTAL, CKMB, CKMBINDEX, TROPONINI  in the last 168 hours.  BNP: Invalid input(s): POCBNP  CBG: Recent Labs  Lab 10/01/19 1640 10/01/19 2055 10/02/19 0114 10/02/19 0503 10/02/19 0738  GLUCAP 103* 116* 126* 120* 146*    Microbiology: Results for orders placed or performed during the hospital encounter of 09/27/19  SARS CORONAVIRUS 2 (TAT 6-24 HRS) Nasopharyngeal Nasopharyngeal Swab     Status: None   Collection Time: 09/28/19  1:40 AM   Specimen: Nasopharyngeal Swab  Result Value Ref Range Status   SARS Coronavirus 2 NEGATIVE NEGATIVE Final    Comment: (NOTE) SARS-CoV-2 target nucleic acids are NOT DETECTED. The SARS-CoV-2 RNA is generally detectable in upper and lower respiratory specimens during the acute phase of infection. Negative results do not preclude SARS-CoV-2 infection, do not rule out co-infections with other pathogens, and should not be used as the sole basis for treatment or other patient management decisions. Negative results must be combined with clinical observations, patient history, and epidemiological information. The expected result is Negative. Fact Sheet for Patients: SugarRoll.be Fact Sheet for  Healthcare Providers: https://www.woods-mathews.com/ This test is not yet approved or cleared by the [place] FDA and  has been authorized for detection and/or diagnosis of SARS-CoV-2 by FDA under an Emergency Use Authorization (EUA). This EUA will remain  in effect (meaning this test can be used) for the duration of the COVID-19 declaration under Section 56 4(b)(1) of the Act, 21 U.S.C. section 360bbb-3(b)(1), unless the authorization is terminated or revoked sooner. Performed at [facility], [place] [address], [place], [place] [address]   MRSA PCR Screening     Status: None   Collection Time: 09/28/19  3:08 AM   Specimen: Nasal Mucosa; Nasopharyngeal  Result Value Ref Range Status   MRSA by PCR NEGATIVE NEGATIVE Final    Comment:        The GeneXpert MRSA Assay (FDA approved for NASAL specimens only), is one component of a comprehensive MRSA colonization surveillance program. It is not intended to diagnose MRSA infection nor to guide or monitor treatment for MRSA infections. Performed at [facility], [place], [place] [address]   SARS CORONAVIRUS 2 (TAT 6-24 HRS) Nasopharyngeal Nasopharyngeal Swab     Status: None   Collection Time: 10/01/19 12:28 PM   Specimen: Nasopharyngeal Swab  Result Value Ref Range Status   SARS Coronavirus 2 NEGATIVE NEGATIVE Final    Comment: (NOTE) SARS-CoV-2 target nucleic acids are NOT DETECTED. The SARS-CoV-2 RNA is generally detectable in upper and lower respiratory specimens during the acute phase of infection. Negative results do not preclude SARS-CoV-2 infection, do not rule out co-infections with other pathogens, and should not be used as the sole basis for treatment or other patient management decisions. Negative results must be combined with clinical observations, patient history, and epidemiological information. The expected result is Negative. Fact Sheet for  Patients: SugarRoll.be Fact Sheet for Healthcare Providers: https://www.woods-mathews.com/ This test is not yet approved or cleared by the [place] FDA and  has been authorized for detection and/or diagnosis of SARS-CoV-2 by FDA under an Emergency Use Authorization (EUA). This EUA will remain  in effect (meaning this test can be used) for the duration of the COVID-19 declaration under Section 56 4(b)(1) of the Act, 21 U.S.C. section 360bbb-3(b)(1), unless the authorization is terminated or revoked sooner. Performed at [facility], [place] [address], [place], [place] [address]     Coagulation Studies: No results for input(s): LABPROT, INR in the last 72  hours.  Urinalysis: No results for input(s): COLORURINE, LABSPEC, PHURINE, GLUCOSEU, HGBUR, BILIRUBINUR, KETONESUR, PROTEINUR, UROBILINOGEN, NITRITE, LEUKOCYTESUR in the last 72 hours.  Invalid input(s): APPERANCEUR    Imaging: DG Chest Port 1 View  Result Date: 10/01/2019 CLINICAL DATA:  Short of breath. EXAM: PORTABLE CHEST 1 VIEW COMPARISON:  09/28/2019 FINDINGS: Cardiac silhouette borderline enlarged. No mediastinal or hilar masses. Left sided stent, which appears to extend from the brachiocephalic to the proximal left subclavian vein, stable. Lungs demonstrate vascular congestion, interstitial thickening and mild hazy opacity at the bases, left greater than right. Suspect small pleural effusions. No pneumothorax. Skeletal structures are grossly intact. IMPRESSION: 1. Findings consistent with interstitial pulmonary edema with small pleural effusions. Electronically Signed   By: Lajean Manes M.D.   On: 10/01/2019 12:48     Medications:    . amLODipine  5 mg Per Tube Daily  . B-complex with vitamin C  1 tablet Per Tube QHS  . doxycycline  100 mg Per Tube Q12H  . epoetin (EPOGEN/PROCRIT) injection  10,000 Units Intravenous Q M,W,F-HD  . feeding supplement (NEPRO CARB STEADY)   1,000 mL Per Tube Q24H  . feeding supplement (PRO-STAT SUGAR FREE 64)  30 mL Per Tube BID  . free water  50 mL Per Tube 6 X Daily  . gabapentin  100 mg Per Tube Daily  . lacosamide  100 mg Per Tube BID  . pantoprazole (PROTONIX) IV  40 mg Intravenous Q12H     Assessment/ Plan:  Barbara [name] is a 79 y.o. black female with end stage renal disease on hemodialysis, hypertension, diabetes mellitus type II, diabetic neuropathy, seizure disorder, left hip repair, anemia of chronic kidney disease, secondary hyperparathyroidism  [facility] M[facility] LUE AVG 79.5kg   1.  ESRD on HD MWF.  Seen and examined on hemodialysis treatment.    2. Anemia of chronic kidney disease.  With GI bleed. Hemoglobin 8.6 Requiring blood transfusion on admission.  - Continue EPO - pantoprazole IV   3.  Secondary hyperparathyroidism.  Holding binders.   4. Hypertension:  - amlodipine.     LOS: 4 Izaya Netherton 3/15/202112:34 PM

## 2019-10-02 NOTE — Consult Note (Signed)
Name: Barbara [name] MRN: [id number] DOB: Dec 15, 1940    ADMISSION DATE:  09/27/2019 CONSULTATION DATE: 10/02/2019  REFERRING MD : Dr. Posey Pronto   CHIEF COMPLAINT: Hypoxia   BRIEF PATIENT DESCRIPTION:  79 yo female with ESRD on HD admitted with acute GI bleed s/p upper endoscopy requiring transfer to the stepdown unit with worsening acute on chronic hypoxic respiratory failure secondary to acute on chronic CHF exacerbation and AECOPD requiring HFNC    SIGNIFICANT EVENTS/STUDIES:  03/11: Pt admitted to the stepdown unit with acute GI bleed  03/11: GI consulted for PEG tube replacement and assessment of GI bleed. Pt underwent upper endoscopy which revealed normal esophagus. Non-bleeding gastric ulcer with no bleeding, surrounding the internal bumper of the foley catheter. Normal examined duodenum. The examination was otherwise normal. PEG tube also replaced. GI recommended resuming diet  03/11: Pt transferred to the telemetry unit  03/12: Required 1 unit of pRBC's due to hgb of 6.7 03/14: Repeat COVID-19 results negative  03/15: Speech therapy evaluated pt again and due to waxing/waning of alertness and lethargy, therapeutic po trials held until pt returns to SNF.  TF's meeting primary nutritional needs 03/15: Pt developed worsening hypoxia requiring transfer to the stepdown unit for [facility] PCCM team consulted to assist with management.   HISTORY OF PRESENT ILLNESS:   This is a 79 yo female with a PMH of Urinary Obstruction, OSA (CPAP qhs), Bladder Cancer s/p Chemotherapy and Radiation, Neuropathy, Chronic O2 _0 , Lower Back Pain, HTN, Hematuria, GERD, Hyperlipidemia, Dyspnea with Exertion, ESRD (T-Th-Sat), Type II Diabetes Mellitus, CAD, Diastolic Dysfunction of the Left Ventricle, Aortic Valve Sclerosis without Overt Stenosis, COPD, Asthma, Arthritis, Anemia of Chronic Kidney Disease, Celiac Disease, Recently hospitalized from 02/16 to 03/7 following a left total hip arthroplasty  (35/59/7416)  complicated by toxic metabolic encephalopathy due to severe metabolic acidosis, and seizure activity (started on vimpat) requiring mechanical intubation/CRRTand PEG tube placement due to dysphagia then discharged to [facility] facility.   She presented to [facility] ER on 03/10 via EMS from [facility] with coffee-ground emesis onset 2 days prior to presentation.  Lab results at the facility revealed hgb of 5 prompting ER visit.  It was also reported the pt pulled out her PEG tube.  In the ER lab results revealed Na+ 133, glucose 121, BUN 39, creatinine 4.37, albumin 2.5, lipase 63, creatinine 4.37, wbc 19.7, hgb 4.8 and COVID-19 negative.  ER physician also reported the pt had a grossly melanotic stool. Pt received 2 unit of pRBC's, protonix gtt started, and GI consulted.  She was subsequently admitted to the stepdown unit per hospitalist team for additional workup and treatment. Detailed hospital course outlined above.   PAST MEDICAL HISTORY :   has a past medical history of Adult celiac disease, Anemia associated with chronic renal failure (2017), Arthritis, Asthma, Cancer ([place]) (2017), Chronic kidney disease, CKD (chronic kidney disease), COPD (chronic obstructive pulmonary disease) ([place]), Coronary artery disease, Diabetes mellitus without complication ([place]), Dialysis patient ([place]), Dyspnea, Elevated lipids, GERD (gastroesophageal reflux disease), Hematuria, Hypertension, Lower back pain, Neuropathy, Oxygen dependent, Personal history of chemotherapy (2017), Personal history of radiation therapy (2017), Sleep apnea, and Urinary obstruction (01/2016).  has a past surgical history that includes Abdominal hysterectomy; Rotator cuff repair (Left); Kidney surgery (01/21/2016); Cystoscopy with stent placement (Left, 01/21/2016); Transurethral resection of bladder tumor (N/A, 02/17/2016); Ureteroscopy (Left, 02/17/2016); Cystoscopy w/ retrogrades (Bilateral, 02/17/2016); Cardiac catheterization (N/A, 04/07/2016);  Cystoscopy w/ ureteral stent placement (Left, 05/12/2016); Cystoscopy w/ ureteral stent placement (  Left, 10/12/2016); Cystoscopy w/ retrogrades (Bilateral, 10/12/2016); Ureteroscopy (Right, 10/12/2016); Cystoscopy with stent placement (Right, 10/12/2016); DIALYSIS/PERMA CATHETER INSERTION (N/A, 11/20/2016); Portacath placement (Right); AV fistula placement (Left, 02/17/2017); DIALYSIS/PERMA CATHETER REMOVAL (N/A, 03/30/2017); PERIPHERAL VASCULAR THROMBECTOMY (Left, 06/02/2017); Cystoscopy with biopsy (N/A, 06/09/2017); Cystoscopy w/ retrogrades (Bilateral, 06/09/2017); Transurethral resection of bladder tumor (N/A, 10/07/2017); Cystoscopy w/ retrogrades (Bilateral, 10/07/2017); PERIPHERAL VASCULAR THROMBECTOMY (Left, 01/28/2018); PERIPHERAL VASCULAR THROMBECTOMY (Left, 03/14/2018); PERIPHERAL VASCULAR THROMBECTOMY (Left, 03/16/2018); TEMPORARY DIALYSIS CATHETER (N/A, 05/11/2018); A/V SHUNT INTERVENTION (N/A, 05/12/2018); TEMPORARY DIALYSIS CATHETER (Left, 07/27/2018); A/V SHUNT INTERVENTION (Left, 07/29/2018); PORTA CATH REMOVAL (N/A, 02/07/2019); Total hip arthroplasty (Left, 09/05/2019); PEG placement (N/A, 09/20/2019); and Esophagogastroduodenoscopy (egd) with propofol (N/A, 09/28/2019). Prior to Admission medications   Medication Sig Start Date End Date Taking? Authorizing Provider  albuterol (PROVENTIL HFA;VENTOLIN HFA) 108 (90 Base) MCG/ACT inhaler Inhale 2 puffs into the lungs every 6 (six) hours as needed for wheezing or shortness of breath.  05/16/18  Yes [provider]  Amino Acids-Protein Hydrolys (FEEDING SUPPLEMENT, PRO-STAT SUGAR FREE 64,) LIQD Place 30 mLs into feeding tube 2 (two) times daily. 09/24/19  Yes Nolberto Hanlon, MD  amLODipine (NORVASC) 5 MG tablet Place 1 tablet (5 mg total) into feeding tube daily. 09/24/19  Yes Nolberto Hanlon, MD  atorvastatin (LIPITOR) 40 MG tablet Place 40 mg into feeding tube at bedtime.  05/01/19  Yes [provider]  B Complex-C (B-COMPLEX WITH VITAMIN C) tablet Place  1 tablet into feeding tube at bedtime. 09/24/19  Yes Nolberto Hanlon, MD  Calcium Acetate 667 MG TABS Place 1,334 mg into feeding tube 3 (three) times daily.   Yes [provider]  calcium carbonate (TUMS - DOSED IN MG ELEMENTAL CALCIUM) 500 MG chewable tablet Place 1 tablet into feeding tube at bedtime.    Yes [provider]  cholecalciferol (VITAMIN D) 1000 units tablet Take 1 tablet (1,000 Units total) by mouth daily. 09/24/19  Yes Nolberto Hanlon, MD  epoetin alfa (EPOGEN) 10000 UNIT/ML injection Inject 1 mL (10,000 Units total) into the vein every Monday, Wednesday, and Friday with hemodialysis. 09/25/19  Yes Nolberto Hanlon, MD  esomeprazole (NEXIUM) 40 MG packet 40 mg daily before breakfast. Per tube   Yes [provider]  ferrous sulfate 300 (60 Fe) MG/5ML syrup Place 324 mg into feeding tube at bedtime.   Yes [provider]  gabapentin (NEURONTIN) 300 MG capsule Place 2 capsules (600 mg total) into feeding tube 3 (three) times daily. Patient taking differently: Place 300 mg into feeding tube 3 (three) times daily.  09/24/19  Yes Nolberto Hanlon, MD  heparin 5000 UNIT/ML injection Inject 1 mL (5,000 Units total) into the skin every 12 (twelve) hours. 09/24/19  Yes Nolberto Hanlon, MD  lacosamide 100 MG TABS Place 1 tablet (100 mg total) into feeding tube 2 (two) times daily. 09/24/19  Yes Nolberto Hanlon, MD  multivitamin (RENA-VIT) TABS tablet Place 1 tablet into feeding tube at bedtime. 09/24/19  Yes Nolberto Hanlon, MD  oxybutynin (DITROPAN) 5 MG tablet Place 1 tablet (5 mg total) into feeding tube 3 (three) times daily. 09/24/19  Yes Nolberto Hanlon, MD  OXYGEN Inhale 2 L into the lungs at bedtime.   Yes [provider]  Blood Glucose Monitoring Suppl (ACCU-CHEK AVIVA PLUS) w/Device KIT (USE TO MONITOR BLOOD SUGARS.) 12/30/17   [provider]  NON FORMULARY Place 1 Units into the nose at bedtime. CPAP Time of use 2100-0600    [provider]  Water For Irrigation,  Sterile (FREE  WATER) SOLN Place 50 mLs into feeding tube every 4 (four) hours. Patient not taking: Reported on 09/27/2019 09/24/19   Nolberto Hanlon, MD   Allergies  Allergen Reactions  . Glipizide Er Other (See Comments)    Hypoglycemia   . Percocet [Oxycodone-Acetaminophen] Hives    FAMILY HISTORY:  family history includes Cancer in her father; Diabetes in her mother. SOCIAL HISTORY:  reports that she quit smoking about 17 years ago. Her smoking use included cigarettes. She has never used smokeless tobacco. She reports that she does not drink alcohol or use drugs.  REVIEW OF SYSTEMS: Positives in BOLD  Constitutional: Negative for fever, chills, weight loss, malaise/fatigue and diaphoresis.  HENT: Negative for hearing loss, ear pain, nosebleeds, congestion, sore throat, neck pain, tinnitus and ear discharge.   Eyes: Negative for blurred vision, double vision, photophobia, pain, discharge and redness.  Respiratory: cough, hemoptysis, sputum production, shortness of breath, wheezing and stridor.   Cardiovascular: Negative for chest pain, palpitations, orthopnea, claudication, leg swelling and PND.  Gastrointestinal: Negative for heartburn, nausea, vomiting, abdominal pain, diarrhea, constipation, blood in stool and melena.  Genitourinary: Negative for dysuria, urgency, frequency, hematuria and flank pain.  Musculoskeletal: Negative for myalgias, back pain, joint pain and falls.  Skin: Negative for itching and rash.  Neurological: Negative for dizziness, tingling, tremors, sensory change, speech change, focal weakness, seizures, loss of consciousness, weakness and headaches.  Endo/Heme/Allergies: Negative for environmental allergies and polydipsia. Does not bruise/bleed easily.  SUBJECTIVE:  c/o mild shortness of breath   VITAL SIGNS: Temp:  [98.4 F (36.9 C)-100 F (37.8 C)] 98.7 F (37.1 C) (03/15 2000) Pulse Rate:  [89-120] 100 (03/15 1900) Resp:  [18-30] 25 (03/15 1900) BP:  (121-178)/(50-124) 121/84 (03/15 1900) SpO2:  [87 %-100 %] 87 % (03/15 1900) Weight:  [78.5 kg] 78.5 kg (03/15 0930)  PHYSICAL EXAMINATION: General: chronically ill appearing elderly female resting in bed, NAD Neuro: alert and oriented, follows commands HEENT: supple, no JVD  Cardiovascular: nsr, rrr, no R/G, 1+ bilateral distal pulses  Lungs: faint expiratory wheezes throughout, even, non labored  Abdomen: +BS x4, obese, soft, non tender, non distended, LUQ PEG tube intact  Musculoskeletal: bilateral lower extremity calf pain, moves all extremities, no edema  Skin: left upper arm fistula +bruit/thrill, perineum MASD, stage II right buttocks pressure ulcer   Recent Labs  Lab 09/30/19 0837 10/01/19 0903 10/02/19 0930  NA 138 135 136  K 4.3 4.9 3.7  CL 98 97* 97*  CO2 _0 BUN 37* 58* 71*  CREATININE 5.00* 6.69* 8.21*  GLUCOSE 109* 134* 168*   Recent Labs  Lab 09/30/19 0837 10/01/19 0903 10/02/19 0500  HGB 9.7* 8.9* 8.6*  HCT 29.9* 27.6* 27.3*  WBC 12.6* 14.2* 16.8*  PLT 269 289 315   DG Chest Port 1 View  Result Date: 10/02/2019 CLINICAL DATA:  Shortness of breath. Concern for GI bleed. EXAM: PORTABLE CHEST 1 VIEW COMPARISON:  Radiograph yesterday, multiple prior exams. CT 08/24/2019 FINDINGS: Stable cardiomegaly. Unchanged mediastinal contours. Vascular stent in the region of the left subclavian unchanged. Unchanged elevation of left hemidiaphragm. Diffuse interstitial thickening consistent with pulmonary edema, unchanged. Possible trace pleural effusions. Subsegmental opacities at the left lung base. No pneumothorax. Patient's chin obscures the apices. Vascular stent in the left axilla and upper arm, partially included. IMPRESSION: Unchanged cardiomegaly and pulmonary edema since yesterday. Possible trace pleural effusions. Electronically Signed   By: Keith Rake M.D.   On: 10/02/2019 19:46   DG Chest [facility]  1 View  Result Date: 10/01/2019 CLINICAL DATA:  Short of  breath. EXAM: PORTABLE CHEST 1 VIEW COMPARISON:  09/28/2019 FINDINGS: Cardiac silhouette borderline enlarged. No mediastinal or hilar masses. Left sided stent, which appears to extend from the brachiocephalic to the proximal left subclavian vein, stable. Lungs demonstrate vascular congestion, interstitial thickening and mild hazy opacity at the bases, left greater than right. Suspect small pleural effusions. No pneumothorax. Skeletal structures are grossly intact. IMPRESSION: 1. Findings consistent with interstitial pulmonary edema with small pleural effusions. Electronically Signed   By: Lajean Manes M.D.   On: 10/01/2019 12:48    ASSESSMENT / PLAN:  Acute on chronic hypoxic respiratory failure secondary to acute CHF exacerbation and AECOPD OSA Hx: Chronic home O2 and Asthma Supplemental O2 for dyspnea and/or hypoxia  Bipap or CPAP qhs  Prn and scheduled bronchodilator therapy  IV and nebulized steroids US Venous Bilateral Lower Extremities pending-given recent left total hip arthroplasty on 09/05/2019 and pt c/o of bilateral calf pain will need to r/o VTE. Consider discussing possible need for CTA Chest to r/o PE with nephrology due to ESRD   Acute on chronic CHF exacerbation  Hx: Aortic valve sclerosis without overt stenosis, HTN, CAD, and Hyperlipidemia Continuous telemetry monitoring Monitor for fluid overload   Continue outpatient amlodipine  ESRD on HD  Trend BMP  Replace electrolytes as indicated  Monitor UOP Avoid nephrotoxic medications  Nephrology consulted appreciate input-HD per recommendations  Acute blood loss anemia and anemia of CKD s/p upper endoscopy  Trend CBC Monitor for s/sx of bleeding  Transfuse for hgb <7 and/or active signs of bleeding Avoid chemical VTE px   Dysphagia resulting in PEG tube dependency  Continue TF's Dietitian consulted appreciate input  Speech therapy recommendations outlined above under significant events Aspiration precautions    Type II Diabetes Mellitus-controlled  CBG's q4hrs   Seizure hx-stable Continue outpatient [place], [place] Pager [phone] (please enter 7 digits) PCCM Consult Pager [phone] (please enter 7 digits)

## 2019-10-03 ENCOUNTER — Encounter: Payer: Self-pay | Admitting: Family Medicine

## 2019-10-03 ENCOUNTER — Inpatient Hospital Stay: Payer: Medicare Other

## 2019-10-03 LAB — CBC
HCT: 26.7 % — ABNORMAL LOW (ref 36.0–46.0)
Hemoglobin: 8.3 g/dL — ABNORMAL LOW (ref 12.0–15.0)
MCH: 28.8 pg (ref 26.0–34.0)
MCHC: 31.1 g/dL (ref 30.0–36.0)
MCV: 92.7 fL (ref 80.0–100.0)
Platelets: 255 10*3/uL (ref 150–400)
RBC: 2.88 MIL/uL — ABNORMAL LOW (ref 3.87–5.11)
RDW: 16.7 % — ABNORMAL HIGH (ref 11.5–15.5)
WBC: 8.6 10*3/uL (ref 4.0–10.5)
nRBC: 0 % (ref 0.0–0.2)

## 2019-10-03 LAB — RENAL FUNCTION PANEL
Albumin: 2.4 g/dL — ABNORMAL LOW (ref 3.5–5.0)
Anion gap: 12 (ref 5–15)
BUN: 48 mg/dL — ABNORMAL HIGH (ref 8–23)
CO2: 31 mmol/L (ref 22–32)
Calcium: 8.8 mg/dL — ABNORMAL LOW (ref 8.9–10.3)
Chloride: 98 mmol/L (ref 98–111)
Creatinine, Ser: 5.47 mg/dL — ABNORMAL HIGH (ref 0.44–1.00)
GFR calc Af Amer: 8 mL/min — ABNORMAL LOW (ref >60)
GFR calc non Af Amer: 7 mL/min — ABNORMAL LOW (ref >60)
Glucose, Bld: 174 mg/dL — ABNORMAL HIGH (ref 70–99)
Phosphorus: 3.9 mg/dL (ref 2.5–4.6)
Potassium: 4 mmol/L (ref 3.5–5.1)
Sodium: 141 mmol/L (ref 135–145)

## 2019-10-03 LAB — HEMOGLOBIN A1C
Hgb A1c MFr Bld: 5.7 % — ABNORMAL HIGH (ref 4.8–5.6)
Mean Plasma Glucose: 116.89 mg/dL

## 2019-10-03 LAB — GLUCOSE, CAPILLARY
Glucose-Capillary: 138 mg/dL — ABNORMAL HIGH (ref 70–99)
Glucose-Capillary: 156 mg/dL — ABNORMAL HIGH (ref 70–99)
Glucose-Capillary: 158 mg/dL — ABNORMAL HIGH (ref 70–99)
Glucose-Capillary: 158 mg/dL — ABNORMAL HIGH (ref 70–99)
Glucose-Capillary: 160 mg/dL — ABNORMAL HIGH (ref 70–99)

## 2019-10-03 LAB — MAGNESIUM: Magnesium: 2 mg/dL (ref 1.7–2.4)

## 2019-10-03 MED ORDER — IOHEXOL 350 MG/ML SOLN
75.0000 mL | Freq: Once | INTRAVENOUS | Status: AC | PRN
  Administered 2019-10-03: 75 mL via INTRAVENOUS

## 2019-10-03 MED ORDER — PANTOPRAZOLE SODIUM 40 MG IV SOLR
40.0000 mg | Freq: Every day | INTRAVENOUS | Status: DC
  Administered 2019-10-03: 40 mg via INTRAVENOUS

## 2019-10-03 MED ORDER — PANTOPRAZOLE SODIUM 40 MG PO PACK
40.0000 mg | PACK | Freq: Two times a day (BID) | ORAL | Status: DC
  Administered 2019-10-03 – 2019-10-09 (×12): 40 mg
  Filled 2019-10-03 (×17): qty 20

## 2019-10-03 MED ORDER — INSULIN ASPART 100 UNIT/ML ~~LOC~~ SOLN
0.0000 [IU] | SUBCUTANEOUS | Status: DC
  Administered 2019-10-03 – 2019-10-05 (×6): 1 [IU] via SUBCUTANEOUS
  Administered 2019-10-06: 09:00:00 2 [IU] via SUBCUTANEOUS
  Administered 2019-10-06: 12:00:00 1 [IU] via SUBCUTANEOUS
  Administered 2019-10-06: 22:00:00 2 [IU] via SUBCUTANEOUS
  Administered 2019-10-06 – 2019-10-10 (×6): 1 [IU] via SUBCUTANEOUS
  Filled 2019-10-03 (×16): qty 1

## 2019-10-03 NOTE — Progress Notes (Signed)
CH visited pt. while nearby on ICU and saw Pt. motioning; pt. in bed, awake.  CH introduced himself --pt. apparently glad to see a chaplain.  CH shared he had met pt.'s family a month ago during her prior stay on ICU; pt. seemed slightly confused, but was communicative and pleasant.  Requested RN help to reposition to be able to sit.  Concern relayed to RN.  No further needs expressed.    10/03/19 1600  Clinical Encounter Type  Visited With Patient  Visit Type Initial;Follow-up;Psychological support;Social support;Spiritual support;Critical Care  Referral From Patient  Consult/Referral To Chaplain  Spiritual Encounters  Spiritual Needs Emotional  Stress Factors  Patient Stress Factors Health changes   

## 2019-10-03 NOTE — Progress Notes (Signed)
PT Cancellation Note  Patient Details Name: KRYSLYN HELBIG MRN: [id number] DOB: 1940-09-17   Cancelled Treatment:    Reason Eval/Treat Not Completed: Other (comment). Pt moved to ICU due to respiratory failure. Pt now pending imaging/testing for possible PE/DVT. Due to change in status, will complete current order as pt not appropriate for PT treatment at this time. Please re-order therapy when medically stable to participate.   Calysta Craigo 10/03/2019, 8:45 AM Greggory Stallion, PT, DPT [phone]

## 2019-10-03 NOTE — Evaluation (Signed)
Physical Therapy ReEvaluation Patient Details Name: Barbara [name] MRN: [id number] DOB: Dec 17, 1940 Today's Date: 10/03/2019   History of Present Illness  79 yo female with ESRD on HD admitted with acute GI bleed s/p upper endoscopy requiring transfer to the stepdown unit with worsening acute on chronic hypoxic respiratory failure secondary to acute on chronic CHF exacerbation and AECOPD requiring HFNC (transfered to CCU 3/15).  She is 1 month s/p L total hip (posterior approach)  Clinical Impression  Limited re-eval secondary to pt's mental status and need for BM clean up while sitting at EOB.  She showed some effort with mobility and initiation of exercises but showed poor overall strength, mobility and tolerance.  She has had a very drawn out hospitalization after a planned total hip 2/16.  Pt will benefit from continued PT services and need STR once medically ready for d/c, we will continue to work with her as she is able while hospitalized.     Follow Up Recommendations SNF    Equipment Recommendations  Rolling walker with 5" wheels;3in1 (PT);Wheelchair cushion (measurements PT);Wheelchair (measurements PT)    Recommendations for Other Services       Precautions / Restrictions Precautions Precautions: Posterior Hip;Fall Restrictions LLE Weight Bearing: Weight bearing as tolerated      Mobility  Bed Mobility Overal bed mobility: Needs Assistance Bed Mobility: Supine to Sit;Sit to Supine     Supine to sit: Max assist Sit to supine: Max assist   General bed mobility comments: Pt able to show some effort in getting to EOB, but ultimately quite limited  Transfers                 General transfer comment: deferred secondary to pt having very loose stool while sitting EOB and needing cleaned up  Ambulation/Gait                Stairs            Wheelchair Mobility    Modified Rankin (Stroke Patients Only)       Balance Overall balance assessment:  Needs assistance Sitting-balance support: Bilateral upper extremity supported;Feet supported Sitting balance-Leahy Scale: Poor Sitting balance - Comments: Significant posterior lean in sitting requiring frequent assist to keep from falling backward onto bed Postural control: Posterior lean                                   Pertinent Vitals/Pain Pain Assessment: No/denies pain Pain Score: 0-No pain    Home Living Family/patient expects to be discharged to:: Skilled nursing facility Living Arrangements: Spouse/significant other Available Help at Discharge: Family Type of Home: House Home Access: Ramped entrance     Home Layout: One level Home Equipment: Toilet riser;Bedside commode;Walker - 4 wheels;Shower seat Additional Comments: Pt minimally interactive, information gathered from previous documentation    Prior Function Level of Independence: Needs assistance   Gait / Transfers Assistance Needed: per previous documentation pt use SPC for ambulation           Hand Dominance        Extremity/Trunk Assessment   Upper Extremity Assessment Upper Extremity Assessment: Generalized weakness    Lower Extremity Assessment Lower Extremity Assessment: Generalized weakness       Communication   Communication: (pt with only minimal verbal interaction, diff to understand)  Cognition Arousal/Alertness: Awake/alert Behavior During Therapy: Flat affect Overall Cognitive Status: No family/caregiver present to determine baseline cognitive functioning  General Comments: Able to follow some simple instructions, but overall limited interaction and ability to hold conversation      General Comments General comments (skin integrity, edema, etc.): abbreviated assessment, unable to trial standing secondary to BM and need for clean up    Exercises     Assessment/Plan    PT Assessment Patient needs continued PT services   PT Problem List Decreased strength;Decreased range of motion;Decreased activity tolerance;Decreased mobility;Decreased safety awareness       PT Treatment Interventions Gait training;Functional mobility training;Therapeutic activities;Therapeutic exercise    PT Goals (Current goals can be found in the Care Plan section)  Acute Rehab PT Goals Patient Stated Goal: no goals stated PT Goal Formulation: Patient unable to participate in goal setting Time For Goal Achievement: 10/17/19 Potential to Achieve Goals: Fair    Frequency Min 2X/week   Barriers to discharge        Co-evaluation               AM-PAC PT "6 Clicks" Mobility  Outcome Measure Help needed turning from your back to your side while in a flat bed without using bedrails?: Total Help needed moving from lying on your back to sitting on the side of a flat bed without using bedrails?: Total Help needed moving to and from a bed to a chair (including a wheelchair)?: Total Help needed standing up from a chair using your arms (e.g., wheelchair or bedside chair)?: Total Help needed to walk in hospital room?: Total Help needed climbing 3-5 steps with a railing? : Total 6 Click Score: 6    End of Session Equipment Utilized During Treatment: Oxygen Activity Tolerance: Patient limited by fatigue;Other (comment) Patient left: in bed;with nursing/sitter in room;with call bell/phone within reach Nurse Communication: (need for BM clean up) PT Visit Diagnosis: Muscle weakness (generalized) (M62.81);Difficulty in walking, not elsewhere classified (R26.2) Pain - Right/Left: Left Pain - part of body: Hip    Time: 1914-7829 PT Time Calculation (min) (ACUTE ONLY): 19 min   Charges:              Kreg Shropshire, DPT 10/03/2019, 1:52 PM

## 2019-10-03 NOTE — Progress Notes (Signed)
[facility] Progress Note  Patient: Barbara [name]    QBH:[id number]  DOA: 09/27/2019     Date of Service: the patient was seen and examined on 10/03/2019  Chief Complaint  Patient presents with  . Hematemesis   Brief hospital course: TEARIA GIBBS is a 79 y.o. female with medical history significant for ESRD on HD MWF, hyperlipidemia, type 2 diabetes, COPD, asthma, GERD, bladder cancer, and iron deficiency anemia who presents for concerns of acute GI bleed and anemia. SP EGD and replacement of the G-tube.  In last 24 hours patient developed respiratory failure with bilateral wheezes currently being transferred to stepdown unit. Currently further plan is continue treat respiratory failure and monitor for stability transferring to SNF  Assessment and Plan: Acute GI bleed Hemoglobin of 4.8 with a baseline of around 8-9. Needed central line placement given difficulty with peripheral line access Transfused 2 units, hemoglobin low again on 09/29/2019 SP 1 more PRBC transfusion. Continue PPI TWICE daily. Appreciate GI assistance.  SP EGD.  Acute blood loss anemia. Anemia of chronic kidney disease Presented with GI bleed.  Hemoglobin dropped down to 4.8.  Transfused 3 PRBC.  Currently H&H is stable No further bleeding.  Monitor.  PEG tube dependency with dysphagia Placed on 09/20/2019 due to dysphagia Appreciate GI assistance for replacement of the G-tube. GI recommended to resume diet. Currently tolerating well.  Acute hypoxic respiratory failure Acute on chronic systolic CHF. Acute bronchitis. Inspiratory wheezes. Noted by nursing that she chronically is on 2L but this is not noted in last discharge note. Patient currently requiring 6 L of oxygen to maintain adequate saturation. ABG performed on 6 L shows PO2 of 60% and a saturation of 92.6%. No hypercarbia. We will transfer the patient to the stepdown unit for close observation. Patient was given 120 mg of IV Solu-Medrol. We  will continue with Solu-Medrol 60 mg every 6 hours. Antibiotic changed to IV ceftriaxone. CCM consulted for further assistance. CT PE negative for pulmonary embolism.  Lower extremity Doppler negative for DVT as well.  Leukocytosis Concern for cellulitis This appears to have been chronic for the past week.  No signs of fever.  Continue IV antibiotics  ESRD on HD MWF Creatinine is stable Need to monitor for any fluid overload with blood transfusion Appreciate nephrology assistance. Patient will require to sit in the chair.  Chronic diastolic CHF:  Hemodialysis for fluid management  Type 2 diabetes controlled. diet-controlled  Currently on sliding scale insulin. Also on Nepro for tube feeding. Monitor  Hx of seizure Continue home regimen per tube.  Stage 1 sacral and buttock ulcers  Wound care per RN   Dysphagia. Patient is significantly awake and alert. Speech therapy consulted for assessment for the diet. Per speech therapy patient was not eating enough likely from not being hungry while she was on tube feeding. Given lethargy currently holding off on further work-up.  Appreciate speech therapy recommendation.  Acute encephalopathy.  Likely toxic. From medication.  Received fentanyl early this morning. Continues to report severe right leg cramps.  And given Robaxin. Continue to monitor for now.  Body mass index is 27.9 kg/m.  Nutrition Problem: Inadequate oral intake Etiology: dysphagia Interventions: Interventions: Tube feeding, Prostat, MVI  Pressure Injury 09/21/19 Buttocks Right Stage 2 -  Partial thickness loss of dermis presenting as a shallow open injury with a red, pink wound bed without slough. 0.5cmx0.5cm (Active)  09/21/19 0830  Location: Buttocks  Location Orientation: Right  Staging: Stage 2 -  Partial thickness loss of dermis presenting as a shallow open injury with a red, pink wound bed without slough.  Wound Description (Comments): 0.5cmx0.5cm   Present on Admission: No     Diet: Currently n.p.o.  On tube feeding. DVT Prophylaxis: SCD, pharmacological prophylaxis contraindicated due to GI bleed   Advance goals of care discussion: Full code  Family Communication: family was present at bedside, at the time of interview.  Also discussed with daughter on the phone. The pt provided permission to discuss medical plan with the family. Opportunity was given to ask question and all questions were answered satisfactorily.   Disposition:  Pt is from SNF, admitted with upper GI bleed from G-tube malfunction, still significant hypoxia requiring 6 L of oxygen, which precludes a safe discharge. Discharge to SNF, when medically stable  Subjective: Improving shortness of breath.  Continues to have severe wheezing.  No nausea no vomiting.  No fever no chills.  Oxygenation improves overnight.  Physical Exam: General:  alert oriented to person.  Appear in mild distress, affect appropriate Eyes: PERRL ENT: Oral Mucosa Clear, dry Neck: difficult to assess JVD,  Cardiovascular: S1 and S2 Present, no Murmur,  Respiratory: good respiratory effort, Bilateral Air entry equal and Decreased, no Crackles, inspiratory as well as expiratory wheezes primarily seen more and upper airway area, Abdomen: Bowel Sound present, Soft and no tenderness,  Skin: no rash Extremities: no Pedal edema, no calf tenderness Neurologic: without any new focal findings  Gait not checked due to patient safety concerns  Vitals:   10/03/19 1400 10/03/19 1500 10/03/19 1600 10/03/19 1700  BP:   (!) 128/52 139/62  Pulse: 75 74 72 74  Resp: 17 (!) 22 (!) 21 18  Temp:   98.4 F (36.9 C)   TempSrc:   Oral   SpO2: 96% 95% 100% 97%  Weight:      Height:        Intake/Output Summary (Last 24 hours) at 10/03/2019 1935 Last data filed at 10/03/2019 1600 Gross per 24 hour  Intake 4381.22 ml  Output --  Net 4381.22 ml   Filed Weights   10/01/19 0510 10/02/19 0930 10/03/19  0425  Weight: 84.5 kg 78.5 kg 78.4 kg    Data Reviewed: I have personally reviewed and interpreted daily labs, tele strips, imagings as discussed above. I reviewed all nursing notes, pharmacy notes, vitals, pertinent old records I have discussed plan of care as described above with RN and patient/family.  CBC: Recent Labs  Lab 09/29/19 0501 09/30/19 0837 10/01/19 0903 10/02/19 0500 10/03/19 0423  WBC 11.6* 12.6* 14.2* 16.8* 8.6  HGB 6.7* 9.7* 8.9* 8.6* 8.3*  HCT 22.0* 29.9* 27.6* 27.3* 26.7*  MCV 91.3 89.3 89.9 92.2 92.7  PLT 298 269 289 315 378   Basic Metabolic Panel: Recent Labs  Lab 09/29/19 0501 09/30/19 0837 10/01/19 0903 10/02/19 0500 10/02/19 0930 10/03/19 0423  NA 133* 138 135  --  136 141  K 4.5 4.3 4.9  --  3.7 4.0  CL 99 98 97*  --  97* 98  CO2 21* 28 22  --  27 31  GLUCOSE 119* 109* 134*  --  168* 174*  BUN 53* 37* 58*  --  71* 48*  CREATININE 6.85* 5.00* 6.69*  --  8.21* 5.47*  CALCIUM 8.5* 8.1* 8.3*  --  8.5* 8.8*  MG 1.9  --   --  2.0  --  2.0  PHOS  --   --   --   --  3.2 3.9    Studies: CT ANGIO CHEST PE W OR WO CONTRAST  Result Date: 10/03/2019 CLINICAL DATA:  Respiratory failure. EXAM: CT ANGIOGRAPHY CHEST WITH CONTRAST TECHNIQUE: Multidetector CT imaging of the chest was performed using the standard protocol during bolus administration of intravenous contrast. Multiplanar CT image reconstructions and MIPs were obtained to evaluate the vascular anatomy. CONTRAST:  40m OMNIPAQUE IOHEXOL 350 MG/ML SOLN COMPARISON:  August 24, 2019. FINDINGS: Cardiovascular: Satisfactory opacification of the pulmonary arteries to the segmental level. No evidence of pulmonary embolism. Mild cardiomegaly. Atherosclerosis of thoracic aorta is noted without aneurysm or dissection. Patent left brachiocephalic vein stent is noted. No pericardial effusion. Mediastinum/Nodes: Small sliding-type hiatal hernia. No significant adenopathy is noted. Thyroid gland is unremarkable.  Lungs/Pleura: Small left pleural effusion is noted. Mild bilateral posterior basilar subsegmental atelectasis is noted. Possible bilateral pulmonary edema is noted in both upper lobes. Upper Abdomen: No acute abnormality. Musculoskeletal: No chest wall abnormality. No acute or significant osseous findings. Review of the MIP images confirms the above findings. IMPRESSION: 1. No definite evidence of pulmonary embolus. 2. Small left pleural effusion is noted with mild bilateral posterior basilar subsegmental atelectasis. 3. Possible bilateral pulmonary edema is noted in both upper lobes. 4. Small sliding-type hiatal hernia. Aortic Atherosclerosis (ICD10-I70.0). Electronically Signed   By: JMarijo ConceptionM.D.   On: 10/03/2019 12:48   UKoreaVenous Img Lower Bilateral (DVT)  Result Date: 10/03/2019 CLINICAL DATA:  Bilateral lower extremity pain. History of malignancy. Evaluate for DVT. EXAM: BILATERAL LOWER EXTREMITY VENOUS DOPPLER ULTRASOUND TECHNIQUE: Gray-scale sonography with graded compression, as well as color Doppler and duplex ultrasound were performed to evaluate the lower extremity deep venous systems from the level of the common femoral vein and including the common femoral, femoral, profunda femoral, popliteal and calf veins including the posterior tibial, peroneal and gastrocnemius veins when visible. The superficial great saphenous vein was also interrogated. Spectral Doppler was utilized to evaluate flow at rest and with distal augmentation maneuvers in the common femoral, femoral and popliteal veins. COMPARISON:  None. FINDINGS: RIGHT LOWER EXTREMITY Common Femoral Vein: No evidence of thrombus. Normal compressibility, respiratory phasicity and response to augmentation. Saphenofemoral Junction: No evidence of thrombus. Normal compressibility and flow on color Doppler imaging. Profunda Femoral Vein: No evidence of thrombus. Normal compressibility and flow on color Doppler imaging. Femoral Vein: No  evidence of thrombus. Normal compressibility, respiratory phasicity and response to augmentation. Popliteal Vein: No evidence of thrombus. Normal compressibility, respiratory phasicity and response to augmentation. Calf Veins: No evidence of thrombus. Normal compressibility and flow on color Doppler imaging. Superficial Great Saphenous Vein: No evidence of thrombus. Normal compressibility. Venous Reflux:  None. Other Findings:  None. LEFT LOWER EXTREMITY Common Femoral Vein: No evidence of thrombus. Normal compressibility, respiratory phasicity and response to augmentation. Saphenofemoral Junction: No evidence of thrombus. Normal compressibility and flow on color Doppler imaging. Profunda Femoral Vein: No evidence of thrombus. Normal compressibility and flow on color Doppler imaging. Femoral Vein: No evidence of thrombus. Normal compressibility, respiratory phasicity and response to augmentation. Popliteal Vein: No evidence of thrombus. Normal compressibility, respiratory phasicity and response to augmentation. Calf Veins: No evidence of thrombus. Normal compressibility and flow on color Doppler imaging. Superficial Great Saphenous Vein: No evidence of thrombus. Normal compressibility. Venous Reflux:  None. Other Findings:  None. IMPRESSION: No evidence of DVT within either lower extremity. Electronically Signed   By: JSandi MariscalM.D.   On: 10/03/2019 11:35    Scheduled Meds: . amLODipine  5 mg Per Tube Daily  . B-complex with vitamin C  1 tablet Per Tube QHS  . budesonide (PULMICORT) nebulizer solution  0.5 mg Nebulization BID  . Chlorhexidine Gluconate Cloth  6 each Topical Daily  . epoetin (EPOGEN/PROCRIT) injection  10,000 Units Intravenous Q M,W,F-HD  . feeding supplement (NEPRO CARB STEADY)  1,000 mL Per Tube Q24H  . feeding supplement (PRO-STAT SUGAR FREE 64)  30 mL Per Tube BID  . free water  50 mL Per Tube 6 X Daily  . gabapentin  100 mg Per Tube Daily  . insulin aspart  0-6 Units Subcutaneous  Q4H  . ipratropium  0.5 mg Nebulization Q6H  . lacosamide  100 mg Per Tube BID  . levalbuterol  0.63 mg Nebulization Q6H  . mouth rinse  15 mL Mouth Rinse BID  . methylPREDNISolone (SOLU-MEDROL) injection  60 mg Intravenous Q6H  . pantoprazole (PROTONIX) IV  40 mg Intravenous Daily   Continuous Infusions: . cefTRIAXone (ROCEPHIN)  IV 1 g (10/03/19 1700)   PRN Meds: acetaminophen (TYLENOL) oral liquid 160 mg/5 mL, albuterol, diclofenac Sodium  Time spent: 35 minutes  Author: Berle Mull, MD [facility] 10/03/2019 7:35 PM  To reach On-call, see care teams to locate the attending and reach out to them via [email or web address]. If 7PM-7AM, please contact night-coverage If you still have difficulty reaching the attending provider, please page the [facility] (Director on Call) for [facility] on amion for assistance.

## 2019-10-03 NOTE — Progress Notes (Signed)
Name: EDLYN ROSENBURG MRN: [id number] DOB: Dec 15, 1940    ADMISSION DATE:  09/27/2019 CONSULTATION DATE: 10/02/2019  REFERRING MD : Dr. Posey Pronto   CHIEF COMPLAINT: Hypoxia   BRIEF PATIENT DESCRIPTION:  79 yo female with ESRD on HD admitted with acute GI bleed s/p upper endoscopy requiring transfer to the stepdown unit with worsening acute on chronic hypoxic respiratory failure secondary to acute on chronic CHF exacerbation and AECOPD requiring HFNC    SIGNIFICANT EVENTS/STUDIES:  03/11: Pt admitted to the stepdown unit with acute GI bleed  03/11: GI consulted for PEG tube replacement and assessment of GI bleed. Pt underwent upper endoscopy which revealed normal esophagus. Non-bleeding gastric ulcer with no bleeding, surrounding the internal bumper of the foley catheter. Normal examined duodenum. The examination was otherwise normal. PEG tube also replaced. GI recommended resuming diet  03/11: Pt transferred to the telemetry unit  03/12: Required 1 unit of pRBC's due to hgb of 6.7 03/14: Repeat COVID-19 results negative  03/15: Speech therapy evaluated pt again and due to waxing/waning of alertness and lethargy, therapeutic po trials held until pt returns to SNF.  TF's meeting primary nutritional needs 03/15: Pt developed worsening hypoxia requiring transfer to the stepdown unit for [facility] PCCM team consulted to assist with management.   HISTORY OF PRESENT ILLNESS:   This is a 79 yo female with a PMH of Urinary Obstruction, OSA (CPAP qhs), Bladder Cancer s/p Chemotherapy and Radiation, Neuropathy, Chronic O2 _0 , Lower Back Pain, HTN, Hematuria, GERD, Hyperlipidemia, Dyspnea with Exertion, ESRD (T-Th-Sat), Type II Diabetes Mellitus, CAD, Diastolic Dysfunction of the Left Ventricle, Aortic Valve Sclerosis without Overt Stenosis, COPD, Asthma, Arthritis, Anemia of Chronic Kidney Disease, Celiac Disease, Recently hospitalized from 02/16 to 03/7 following a left total hip arthroplasty  (35/59/7416)  complicated by toxic metabolic encephalopathy due to severe metabolic acidosis, and seizure activity (started on vimpat) requiring mechanical intubation/CRRTand PEG tube placement due to dysphagia then discharged to [facility].   She presented to [facility] ER on 03/10 via EMS from [facility] with coffee-ground emesis onset 2 days prior to presentation.  Lab results at the facility revealed hgb of 5 prompting ER visit.  It was also reported the pt pulled out her PEG tube.  In the ER lab results revealed Na+ 133, glucose 121, BUN 39, creatinine 4.37, albumin 2.5, lipase 63, creatinine 4.37, wbc 19.7, hgb 4.8 and COVID-19 negative.  ER physician also reported the pt had a grossly melanotic stool. Pt received 2 unit of pRBC's, protonix gtt started, and GI consulted.  She was subsequently admitted to the stepdown unit per hospitalist team for additional workup and treatment. Detailed hospital course outlined above.   PAST MEDICAL HISTORY :   has a past medical history of Adult celiac disease, Anemia associated with chronic renal failure (2017), Arthritis, Asthma, Cancer ([place]) (2017), Chronic kidney disease, CKD (chronic kidney disease), COPD (chronic obstructive pulmonary disease) ([place]), Coronary artery disease, Diabetes mellitus without complication ([place]), Dialysis patient ([place]), Dyspnea, Elevated lipids, GERD (gastroesophageal reflux disease), Hematuria, Hypertension, Lower back pain, Neuropathy, Oxygen dependent, Personal history of chemotherapy (2017), Personal history of radiation therapy (2017), Sleep apnea, and Urinary obstruction (01/2016).  has a past surgical history that includes Abdominal hysterectomy; Rotator cuff repair (Left); Kidney surgery (01/21/2016); Cystoscopy with stent placement (Left, 01/21/2016); Transurethral resection of bladder tumor (N/A, 02/17/2016); Ureteroscopy (Left, 02/17/2016); Cystoscopy w/ retrogrades (Bilateral, 02/17/2016); Cardiac catheterization (N/A, 04/07/2016);  Cystoscopy w/ ureteral stent placement (Left, 05/12/2016); Cystoscopy w/ ureteral stent placement (  Left, 10/12/2016); Cystoscopy w/ retrogrades (Bilateral, 10/12/2016); Ureteroscopy (Right, 10/12/2016); Cystoscopy with stent placement (Right, 10/12/2016); DIALYSIS/PERMA CATHETER INSERTION (N/A, 11/20/2016); Portacath placement (Right); AV fistula placement (Left, 02/17/2017); DIALYSIS/PERMA CATHETER REMOVAL (N/A, 03/30/2017); PERIPHERAL VASCULAR THROMBECTOMY (Left, 06/02/2017); Cystoscopy with biopsy (N/A, 06/09/2017); Cystoscopy w/ retrogrades (Bilateral, 06/09/2017); Transurethral resection of bladder tumor (N/A, 10/07/2017); Cystoscopy w/ retrogrades (Bilateral, 10/07/2017); PERIPHERAL VASCULAR THROMBECTOMY (Left, 01/28/2018); PERIPHERAL VASCULAR THROMBECTOMY (Left, 03/14/2018); PERIPHERAL VASCULAR THROMBECTOMY (Left, 03/16/2018); TEMPORARY DIALYSIS CATHETER (N/A, 05/11/2018); A/V SHUNT INTERVENTION (N/A, 05/12/2018); TEMPORARY DIALYSIS CATHETER (Left, 07/27/2018); A/V SHUNT INTERVENTION (Left, 07/29/2018); PORTA CATH REMOVAL (N/A, 02/07/2019); Total hip arthroplasty (Left, 09/05/2019); PEG placement (N/A, 09/20/2019); and Esophagogastroduodenoscopy (egd) with propofol (N/A, 09/28/2019). Prior to Admission medications   Medication Sig Start Date End Date Taking? Authorizing Provider  albuterol (PROVENTIL HFA;VENTOLIN HFA) 108 (90 Base) MCG/ACT inhaler Inhale 2 puffs into the lungs every 6 (six) hours as needed for wheezing or shortness of breath.  05/16/18  Yes [provider]  Amino Acids-Protein Hydrolys (FEEDING SUPPLEMENT, PRO-STAT SUGAR FREE 64,) LIQD Place 30 mLs into feeding tube 2 (two) times daily. 09/24/19  Yes Nolberto Hanlon, MD  amLODipine (NORVASC) 5 MG tablet Place 1 tablet (5 mg total) into feeding tube daily. 09/24/19  Yes Nolberto Hanlon, MD  atorvastatin (LIPITOR) 40 MG tablet Place 40 mg into feeding tube at bedtime.  05/01/19  Yes [provider]  B Complex-C (B-COMPLEX WITH VITAMIN C) tablet Place  1 tablet into feeding tube at bedtime. 09/24/19  Yes Nolberto Hanlon, MD  Calcium Acetate 667 MG TABS Place 1,334 mg into feeding tube 3 (three) times daily.   Yes [provider]  calcium carbonate (TUMS - DOSED IN MG ELEMENTAL CALCIUM) 500 MG chewable tablet Place 1 tablet into feeding tube at bedtime.    Yes [provider]  cholecalciferol (VITAMIN D) 1000 units tablet Take 1 tablet (1,000 Units total) by mouth daily. 09/24/19  Yes Nolberto Hanlon, MD  epoetin alfa (EPOGEN) 10000 UNIT/ML injection Inject 1 mL (10,000 Units total) into the vein every Monday, Wednesday, and Friday with hemodialysis. 09/25/19  Yes Nolberto Hanlon, MD  esomeprazole (NEXIUM) 40 MG packet 40 mg daily before breakfast. Per tube   Yes [provider]  ferrous sulfate 300 (60 Fe) MG/5ML syrup Place 324 mg into feeding tube at bedtime.   Yes [provider]  gabapentin (NEURONTIN) 300 MG capsule Place 2 capsules (600 mg total) into feeding tube 3 (three) times daily. Patient taking differently: Place 300 mg into feeding tube 3 (three) times daily.  09/24/19  Yes Nolberto Hanlon, MD  heparin 5000 UNIT/ML injection Inject 1 mL (5,000 Units total) into the skin every 12 (twelve) hours. 09/24/19  Yes Nolberto Hanlon, MD  lacosamide 100 MG TABS Place 1 tablet (100 mg total) into feeding tube 2 (two) times daily. 09/24/19  Yes Nolberto Hanlon, MD  multivitamin (RENA-VIT) TABS tablet Place 1 tablet into feeding tube at bedtime. 09/24/19  Yes Nolberto Hanlon, MD  oxybutynin (DITROPAN) 5 MG tablet Place 1 tablet (5 mg total) into feeding tube 3 (three) times daily. 09/24/19  Yes Nolberto Hanlon, MD  OXYGEN Inhale 2 L into the lungs at bedtime.   Yes [provider]  Blood Glucose Monitoring Suppl (ACCU-CHEK AVIVA PLUS) w/Device KIT (USE TO MONITOR BLOOD SUGARS.) 12/30/17   [provider]  NON FORMULARY Place 1 Units into the nose at bedtime. CPAP Time of use 2100-0600    [provider]  Water For Irrigation,  Sterile (FREE  WATER) SOLN Place 50 mLs into feeding tube every 4 (four) hours. Patient not taking: Reported on 09/27/2019 09/24/19   Nolberto Hanlon, MD   Allergies  Allergen Reactions  . Glipizide Er Other (See Comments)    Hypoglycemia   . Percocet [Oxycodone-Acetaminophen] Hives    FAMILY HISTORY:  family history includes Cancer in her father; Diabetes in her mother. SOCIAL HISTORY:  reports that she quit smoking about 17 years ago. Her smoking use included cigarettes. She has never used smokeless tobacco. She reports that she does not drink alcohol or use drugs.  REVIEW OF SYSTEMS: Positives in BOLD  Constitutional: Negative for fever, chills, weight loss, malaise/fatigue and diaphoresis.  HENT: Negative for hearing loss, ear pain, nosebleeds, congestion, sore throat, neck pain, tinnitus and ear discharge.   Eyes: Negative for blurred vision, double vision, photophobia, pain, discharge and redness.  Respiratory: cough, hemoptysis, sputum production, shortness of breath, wheezing and stridor.   Cardiovascular: Negative for chest pain, palpitations, orthopnea, claudication, leg swelling and PND.  Gastrointestinal: Negative for heartburn, nausea, vomiting, abdominal pain, diarrhea, constipation, blood in stool and melena.  Genitourinary: Negative for dysuria, urgency, frequency, hematuria and flank pain.  Musculoskeletal: Negative for myalgias, back pain, joint pain and falls.  Skin: Negative for itching and rash.  Neurological: Negative for dizziness, tingling, tremors, sensory change, speech change, focal weakness, seizures, loss of consciousness, weakness and headaches.  Endo/Heme/Allergies: Negative for environmental allergies and polydipsia. Does not bruise/bleed easily.  SUBJECTIVE:  c/o mild shortness of breath   VITAL SIGNS: Temp:  [98.4 F (36.9 C)-98.7 F (37.1 C)] 98.6 F (37 C) (03/16 0200) Pulse Rate:  [78-120] 81 (03/16 0730) Resp:  [14-30] 14 (03/16 0730) BP:  (107-178)/(46-124) 107/54 (03/16 0400) SpO2:  [87 %-99 %] 98 % (03/16 0730) Weight:  [78.4 kg] 78.4 kg (03/16 0425)  PHYSICAL EXAMINATION: General: chronically ill appearing elderly female resting in bed, NAD Neuro: alert and oriented, follows commands HEENT: supple, no JVD  Cardiovascular: nsr, rrr, no R/G, 1+ bilateral distal pulses  Lungs: faint expiratory wheezes throughout, even, non labored  Abdomen: +BS x4, obese, soft, non tender, non distended, LUQ PEG tube intact  Musculoskeletal: bilateral lower extremity calf pain, moves all extremities, no edema  Skin: left upper arm fistula +bruit/thrill, perineum MASD, stage II right buttocks pressure ulcer   Recent Labs  Lab 10/01/19 0903 10/02/19 0930 10/03/19 0423  NA 135 136 141  K 4.9 3.7 4.0  CL 97* 97* 98  CO2 22 27 31   BUN 58* 71* 48*  CREATININE 6.69* 8.21* 5.47*  GLUCOSE 134* 168* 174*   Recent Labs  Lab 10/01/19 0903 10/02/19 0500 10/03/19 0423  HGB 8.9* 8.6* 8.3*  HCT 27.6* 27.3* 26.7*  WBC 14.2* 16.8* 8.6  PLT 289 315 255   DG Chest Port 1 View  Result Date: 10/02/2019 CLINICAL DATA:  Shortness of breath. Concern for GI bleed. EXAM: PORTABLE CHEST 1 VIEW COMPARISON:  Radiograph yesterday, multiple prior exams. CT 08/24/2019 FINDINGS: Stable cardiomegaly. Unchanged mediastinal contours. Vascular stent in the region of the left subclavian unchanged. Unchanged elevation of left hemidiaphragm. Diffuse interstitial thickening consistent with pulmonary edema, unchanged. Possible trace pleural effusions. Subsegmental opacities at the left lung base. No pneumothorax. Patient's chin obscures the apices. Vascular stent in the left axilla and upper arm, partially included. IMPRESSION: Unchanged cardiomegaly and pulmonary edema since yesterday. Possible trace pleural effusions. Electronically Signed   By: Keith Rake M.D.   On: 10/02/2019 19:46   DG Chest [facility]  1 View  Result Date: 10/01/2019 CLINICAL DATA:  Short of  breath. EXAM: PORTABLE CHEST 1 VIEW COMPARISON:  09/28/2019 FINDINGS: Cardiac silhouette borderline enlarged. No mediastinal or hilar masses. Left sided stent, which appears to extend from the brachiocephalic to the proximal left subclavian vein, stable. Lungs demonstrate vascular congestion, interstitial thickening and mild hazy opacity at the bases, left greater than right. Suspect small pleural effusions. No pneumothorax. Skeletal structures are grossly intact. IMPRESSION: 1. Findings consistent with interstitial pulmonary edema with small pleural effusions. Electronically Signed   By: Lajean Manes M.D.   On: 10/01/2019 12:48    ASSESSMENT / PLAN:  Acute on chronic hypoxic respiratory failure secondary to acute CHF exacerbation and AECOPD OSA Hx: Chronic home O2 and Asthma Supplemental O2 for dyspnea and/or hypoxia  Bipap or CPAP qhs  Prn and scheduled bronchodilator therapy  IV and nebulized steroids US Venous Bilateral Lower Extremities pending-given recent left total hip arthroplasty on 09/05/2019 and pt c/o of bilateral calf pain will need to r/o VTE. Consider discussing possible need for CTA Chest to r/o PE with nephrology due to ESRD   Acute on chronic CHF exacerbation  Hx: Aortic valve sclerosis without overt stenosis, HTN, CAD, and Hyperlipidemia Continuous telemetry monitoring Monitor for fluid overload   Continue outpatient amlodipine  ESRD on HD  Trend BMP  Replace electrolytes as indicated  Monitor UOP Avoid nephrotoxic medications  Nephrology consulted appreciate input-HD per recommendations  Acute blood loss anemia and anemia of CKD s/p upper endoscopy  Trend CBC Monitor for s/sx of bleeding  Transfuse for hgb <7 and/or active signs of bleeding Avoid chemical VTE px   Dysphagia resulting in PEG tube dependency  Continue TF's Dietitian consulted appreciate input  Speech therapy recommendations outlined above under significant events Aspiration precautions    Type II Diabetes Mellitus-controlled  CBG's q4hrs   Seizure hx-stable Continue outpatient vimpat

## 2019-10-03 NOTE — Progress Notes (Signed)
[facility]  ROUNDING NOTE   Subjective:   Transferred to ICU for acute exacerbation of COPD with wheezing and increased oxygen requirements.   Hemodialysis treatment yesterday. Tolerated treatment well. UF of 1.5 liters.   Objective:  Vital signs in last 24 hours:  Temp:  [98.4 F (36.9 C)-98.7 F (37.1 C)] 98.6 F (37 C) (03/16 0200) Pulse Rate:  [78-120] 81 (03/16 0730) Resp:  [14-30] 14 (03/16 0730) BP: (107-178)/(46-124) 107/54 (03/16 0400) SpO2:  [87 %-100 %] 98 % (03/16 0730) Weight:  [78.4 kg-78.5 kg] 78.4 kg (03/16 0425)  Weight change:  Filed Weights   10/01/19 0510 10/02/19 0930 10/03/19 0425  Weight: 84.5 kg 78.5 kg 78.4 kg    Intake/Output: I/O last 3 completed shifts: In: 97.9 [IV Piggyback:97.9] Out: -    Intake/Output this shift:  No intake/output data recorded.   Physical Exam: General:  Laying in the bed , resting comfortably.   HEENT:  Anicteric, moist oral mucous membranes  Lungs:   Normal breathing effort, clear bilateral, 5 L [place]  Heart:  regular  Abdomen:   Soft, nontender, bowel sounds present  Extremities:  No peripheral edema  Neurologic:  Awake, alert  Skin:  Warm  Access:  Left arm AV graft    Basic Metabolic Panel: Recent Labs  Lab 09/29/19 0501 09/29/19 0501 09/30/19 0837 09/30/19 0837 10/01/19 0903 10/02/19 0500 10/02/19 0930 10/03/19 0423  NA 133*  --  138  --  135  --  136 141  K 4.5  --  4.3  --  4.9  --  3.7 4.0  CL 99  --  98  --  97*  --  97* 98  CO2 21*  --  28  --  22  --  27 31  GLUCOSE 119*  --  109*  --  134*  --  168* 174*  BUN 53*  --  37*  --  58*  --  71* 48*  CREATININE 6.85*  --  5.00*  --  6.69*  --  8.21* 5.47*  CALCIUM 8.5*   < > 8.1*   < > 8.3*  --  8.5* 8.8*  MG 1.9  --   --   --   --  2.0  --  2.0  PHOS  --   --   --   --   --   --  3.2 3.9   < > = values in this interval not displayed.    Liver Function Tests: Recent Labs  Lab 09/27/19 2214 10/02/19 0930 10/03/19 0423  AST  36  --   --   ALT 17  --   --   ALKPHOS 56  --   --   BILITOT 0.3  --   --   PROT 7.5  --   --   ALBUMIN 2.5* 2.3* 2.4*   Recent Labs  Lab 09/27/19 2214  LIPASE 63*   No results for input(s): AMMONIA in the last 168 hours.  CBC: Recent Labs  Lab 09/29/19 0501 09/30/19 0837 10/01/19 0903 10/02/19 0500 10/03/19 0423  WBC 11.6* 12.6* 14.2* 16.8* 8.6  HGB 6.7* 9.7* 8.9* 8.6* 8.3*  HCT 22.0* 29.9* 27.6* 27.3* 26.7*  MCV 91.3 89.3 89.9 92.2 92.7  PLT 298 269 289 315 255    Cardiac Enzymes: No results for input(s): CKTOTAL, CKMB, CKMBINDEX, TROPONINI in the last 168 hours.  BNP: Invalid input(s): POCBNP  CBG: Recent Labs  Lab 10/02/19 1602 10/02/19 1810 10/02/19  2332 10/03/19 0440 10/03/19 0723  GLUCAP 127* 180* 163* 138* 158*    Microbiology: Results for orders placed or performed during the hospital encounter of 09/27/19  SARS CORONAVIRUS 2 (TAT 6-24 HRS) Nasopharyngeal Nasopharyngeal Swab     Status: None   Collection Time: 09/28/19  1:40 AM   Specimen: Nasopharyngeal Swab  Result Value Ref Range Status   SARS Coronavirus 2 NEGATIVE NEGATIVE Final    Comment: (NOTE) SARS-CoV-2 target nucleic acids are NOT DETECTED. The SARS-CoV-2 RNA is generally detectable in upper and lower respiratory specimens during the acute phase of infection. Negative results do not preclude SARS-CoV-2 infection, do not rule out co-infections with other pathogens, and should not be used as the sole basis for treatment or other patient management decisions. Negative results must be combined with clinical observations, patient history, and epidemiological information. The expected result is Negative. Fact Sheet for Patients: SugarRoll.be Fact Sheet for Healthcare Providers: https://www.woods-mathews.com/ This test is not yet approved or cleared by the [place] FDA and  has been authorized for detection and/or diagnosis of SARS-CoV-2  by FDA under an Emergency Use Authorization (EUA). This EUA will remain  in effect (meaning this test can be used) for the duration of the COVID-19 declaration under Section 56 4(b)(1) of the Act, 21 U.S.C. section 360bbb-3(b)(1), unless the authorization is terminated or revoked sooner. Performed at [facility], [place] [address] [place], [place] [address]   MRSA PCR Screening     Status: None   Collection Time: 09/28/19  3:08 AM   Specimen: Nasal Mucosa; Nasopharyngeal  Result Value Ref Range Status   MRSA by PCR NEGATIVE NEGATIVE Final    Comment:        The GeneXpert MRSA Assay (FDA approved for NASAL specimens only), is one component of a comprehensive MRSA colonization surveillance program. It is not intended to diagnose MRSA infection nor to guide or monitor treatment for MRSA infections. Performed at [facility], [place], [place] [address]   SARS CORONAVIRUS 2 (TAT 6-24 HRS) Nasopharyngeal Nasopharyngeal Swab     Status: None   Collection Time: 10/01/19 12:28 PM   Specimen: Nasopharyngeal Swab  Result Value Ref Range Status   SARS Coronavirus 2 NEGATIVE NEGATIVE Final    Comment: (NOTE) SARS-CoV-2 target nucleic acids are NOT DETECTED. The SARS-CoV-2 RNA is generally detectable in upper and lower respiratory specimens during the acute phase of infection. Negative results do not preclude SARS-CoV-2 infection, do not rule out co-infections with other pathogens, and should not be used as the sole basis for treatment or other patient management decisions. Negative results must be combined with clinical observations, patient history, and epidemiological information. The expected result is Negative. Fact Sheet for Patients: SugarRoll.be Fact Sheet for Healthcare Providers: https://www.woods-mathews.com/ This test is not yet approved or cleared by the [place] FDA and  has been authorized for  detection and/or diagnosis of SARS-CoV-2 by FDA under an Emergency Use Authorization (EUA). This EUA will remain  in effect (meaning this test can be used) for the duration of the COVID-19 declaration under Section 56 4(b)(1) of the Act, 21 U.S.C. section 360bbb-3(b)(1), unless the authorization is terminated or revoked sooner. Performed at [facility], [place] [address], [place], [place] [address]     Coagulation Studies: No results for input(s): LABPROT, INR in the last 72 hours.  Urinalysis: No results for input(s): COLORURINE, LABSPEC, PHURINE, GLUCOSEU, HGBUR, BILIRUBINUR, KETONESUR, PROTEINUR, UROBILINOGEN, NITRITE, LEUKOCYTESUR in the last  72 hours.  Invalid input(s): APPERANCEUR    Imaging: DG Chest Port 1 View  Result Date: 10/02/2019 CLINICAL DATA:  Shortness of breath. Concern for GI bleed. EXAM: PORTABLE CHEST 1 VIEW COMPARISON:  Radiograph yesterday, multiple prior exams. CT 08/24/2019 FINDINGS: Stable cardiomegaly. Unchanged mediastinal contours. Vascular stent in the region of the left subclavian unchanged. Unchanged elevation of left hemidiaphragm. Diffuse interstitial thickening consistent with pulmonary edema, unchanged. Possible trace pleural effusions. Subsegmental opacities at the left lung base. No pneumothorax. Patient's chin obscures the apices. Vascular stent in the left axilla and upper arm, partially included. IMPRESSION: Unchanged cardiomegaly and pulmonary edema since yesterday. Possible trace pleural effusions. Electronically Signed   By: Keith Rake M.D.   On: 10/02/2019 19:46   DG Chest Port 1 View  Result Date: 10/01/2019 CLINICAL DATA:  Short of breath. EXAM: PORTABLE CHEST 1 VIEW COMPARISON:  09/28/2019 FINDINGS: Cardiac silhouette borderline enlarged. No mediastinal or hilar masses. Left sided stent, which appears to extend from the brachiocephalic to the proximal left subclavian vein, stable. Lungs demonstrate vascular congestion, interstitial  thickening and mild hazy opacity at the bases, left greater than right. Suspect small pleural effusions. No pneumothorax. Skeletal structures are grossly intact. IMPRESSION: 1. Findings consistent with interstitial pulmonary edema with small pleural effusions. Electronically Signed   By: Lajean Manes M.D.   On: 10/01/2019 12:48     Medications:   . cefTRIAXone (ROCEPHIN)  IV Stopped (10/02/19 1629)   . amLODipine  5 mg Per Tube Daily  . B-complex with vitamin C  1 tablet Per Tube QHS  . budesonide (PULMICORT) nebulizer solution  0.5 mg Nebulization BID  . Chlorhexidine Gluconate Cloth  6 each Topical Daily  . epoetin (EPOGEN/PROCRIT) injection  10,000 Units Intravenous Q M,W,F-HD  . feeding supplement (NEPRO CARB STEADY)  1,000 mL Per Tube Q24H  . feeding supplement (PRO-STAT SUGAR FREE 64)  30 mL Per Tube BID  . free water  50 mL Per Tube 6 X Daily  . gabapentin  100 mg Per Tube Daily  . insulin aspart  0-6 Units Subcutaneous Q4H  . ipratropium  0.5 mg Nebulization Q6H  . lacosamide  100 mg Per Tube BID  . levalbuterol  0.63 mg Nebulization Q6H  . mouth rinse  15 mL Mouth Rinse BID  . methylPREDNISolone (SOLU-MEDROL) injection  60 mg Intravenous Q6H     Assessment/ Plan:  [name] is a 79 y.o. black female with end stage renal disease on hemodialysis, hypertension, diabetes mellitus type II, diabetic neuropathy, seizure disorder, left hip repair, anemia of chronic kidney disease, secondary hyperparathyroidism  [facility] M[facility] LUE AVG 79.5kg   1.  ESRD on HD MWF.  Hemodialysis treatment yesterday. Tolerated treatment well.    2. Anemia of chronic kidney disease.  With GI bleed. Hemoglobin 8.3 Requiring blood transfusion on admission.  - Continue EPO - Off PPI?   3.  Secondary hyperparathyroidism. Calcium and phosphorus  Holding binders.   4. Hypertension:  - amlodipine.     LOS: 5 Barbara [name] 3/16/20218:53 AM

## 2019-10-04 DIAGNOSIS — K922 Gastrointestinal hemorrhage, unspecified: Secondary | ICD-10-CM

## 2019-10-04 LAB — RENAL FUNCTION PANEL
Albumin: 2.7 g/dL — ABNORMAL LOW (ref 3.5–5.0)
Anion gap: 17 — ABNORMAL HIGH (ref 5–15)
BUN: 91 mg/dL — ABNORMAL HIGH (ref 8–23)
CO2: 27 mmol/L (ref 22–32)
Calcium: 9 mg/dL (ref 8.9–10.3)
Chloride: 95 mmol/L — ABNORMAL LOW (ref 98–111)
Creatinine, Ser: 7.02 mg/dL — ABNORMAL HIGH (ref 0.44–1.00)
GFR calc Af Amer: 6 mL/min — ABNORMAL LOW (ref >60)
GFR calc non Af Amer: 5 mL/min — ABNORMAL LOW (ref >60)
Glucose, Bld: 142 mg/dL — ABNORMAL HIGH (ref 70–99)
Phosphorus: 3.3 mg/dL (ref 2.5–4.6)
Potassium: 3.9 mmol/L (ref 3.5–5.1)
Sodium: 139 mmol/L (ref 135–145)

## 2019-10-04 LAB — GLUCOSE, CAPILLARY
Glucose-Capillary: 177 mg/dL — ABNORMAL HIGH (ref 70–99)
Glucose-Capillary: 146 mg/dL — ABNORMAL HIGH (ref 70–99)
Glucose-Capillary: 147 mg/dL — ABNORMAL HIGH (ref 70–99)
Glucose-Capillary: 136 mg/dL — ABNORMAL HIGH (ref 70–99)
Glucose-Capillary: 137 mg/dL — ABNORMAL HIGH (ref 70–99)

## 2019-10-04 LAB — CBC
HCT: 26.1 % — ABNORMAL LOW (ref 36.0–46.0)
Hemoglobin: 7.9 g/dL — ABNORMAL LOW (ref 12.0–15.0)
MCH: 29 pg (ref 26.0–34.0)
MCHC: 30.3 g/dL (ref 30.0–36.0)
MCV: 96 fL (ref 80.0–100.0)
Platelets: 307 10*3/uL (ref 150–400)
RBC: 2.72 MIL/uL — ABNORMAL LOW (ref 3.87–5.11)
RDW: 16.6 % — ABNORMAL HIGH (ref 11.5–15.5)
WBC: 11.3 10*3/uL — ABNORMAL HIGH (ref 4.0–10.5)
nRBC: 0 % (ref 0.0–0.2)

## 2019-10-04 NOTE — Progress Notes (Signed)
[facility]  ROUNDING NOTE   Subjective:   Seen and examined on hemodialysis treatment. Seated in Chair. Tolerating treatment well.     HEMODIALYSIS FLOWSHEET:  Blood Flow Rate (mL/min): 400 mL/min Arterial Pressure (mmHg): -210 mmHg Venous Pressure (mmHg): 150 mmHg Transmembrane Pressure (mmHg): 70 mmHg Ultrafiltration Rate (mL/min): 830 mL/min Dialysate Flow Rate (mL/min): 500 ml/min Conductivity: Machine : 14 Conductivity: Machine : 14 Dialysis Fluid Bolus: Normal Saline Bolus Amount (mL): 250 mL    Objective:  Vital signs in last 24 hours:  Temp:  [97.5 F (36.4 C)-98.4 F (36.9 C)] 97.8 F (36.6 C) (03/17 1233) Pulse Rate:  [72-81] 75 (03/17 1245) Resp:  [17-22] 17 (03/17 1245) BP: (128-177)/(52-70) 177/70 (03/17 1245) SpO2:  [91 %-100 %] 99 % (03/17 1233)  Weight change:  Filed Weights   10/01/19 0510 10/02/19 0930 10/03/19 0425  Weight: 84.5 kg 78.5 kg 78.4 kg    Intake/Output: I/O last 3 completed shifts: In: 5465.6 [CL/EX:5170; IV Piggyback:87.2] Out: -    Intake/Output this shift:  No intake/output data recorded.   Physical Exam: General:  Sitting in chair  HEENT:  Anicteric, moist oral mucous membranes  Lungs:   Normal breathing effort, clear bilateral, room air  Heart:  regular  Abdomen:   +PEG, soft, obese  Extremities:  No peripheral edema  Neurologic:  alert to self only  Skin:  no lesions.   Access:  Left arm AV graft    Basic Metabolic Panel: Recent Labs  Lab 09/29/19 0501 09/29/19 0501 09/30/19 0837 09/30/19 0837 10/01/19 0903 10/01/19 0903 10/02/19 0500 10/02/19 0930 10/03/19 0423 10/04/19 0805  NA 133*   < > 138  --  135  --   --  136 141 139  K 4.5   < > 4.3  --  4.9  --   --  3.7 4.0 3.9  CL 99   < > 98  --  97*  --   --  97* 98 95*  CO2 21*   < > 28  --  22  --   --  27 31 27   GLUCOSE 119*   < > 109*  --  134*  --   --  168* 174* 142*  BUN 53*   < > 37*  --  58*  --   --  71* 48* 91*  CREATININE 6.85*    < > 5.00*  --  6.69*  --   --  8.21* 5.47* 7.02*  CALCIUM 8.5*   < > 8.1*   < > 8.3*   < >  --  8.5* 8.8* 9.0  MG 1.9  --   --   --   --   --  2.0  --  2.0  --   PHOS  --   --   --   --   --   --   --  3.2 3.9 3.3   < > = values in this interval not displayed.    Liver Function Tests: Recent Labs  Lab 09/27/19 2214 10/02/19 0930 10/03/19 0423 10/04/19 0805  AST 36  --   --   --   ALT 17  --   --   --   ALKPHOS 56  --   --   --   BILITOT 0.3  --   --   --   PROT 7.5  --   --   --   ALBUMIN 2.5* 2.3* 2.4* 2.7*   Recent Labs  Lab 09/27/19 2214  LIPASE 63*   No results for input(s): AMMONIA in the last 168 hours.  CBC: Recent Labs  Lab 09/30/19 0837 10/01/19 0903 10/02/19 0500 10/03/19 0423 10/04/19 0805  WBC 12.6* 14.2* 16.8* 8.6 11.3*  HGB 9.7* 8.9* 8.6* 8.3* 7.9*  HCT 29.9* 27.6* 27.3* 26.7* 26.1*  MCV 89.3 89.9 92.2 92.7 96.0  PLT 269 289 315 255 307    Cardiac Enzymes: No results for input(s): CKTOTAL, CKMB, CKMBINDEX, TROPONINI in the last 168 hours.  BNP: Invalid input(s): POCBNP  CBG: Recent Labs  Lab 10/03/19 1614 10/03/19 1930 10/03/19 2352 10/04/19 0405 10/04/19 0832  GLUCAP 158* 156* 160* 177* 146*    Microbiology: Results for orders placed or performed during the hospital encounter of 09/27/19  SARS CORONAVIRUS 2 (TAT 6-24 HRS) Nasopharyngeal Nasopharyngeal Swab     Status: None   Collection Time: 09/28/19  1:40 AM   Specimen: Nasopharyngeal Swab  Result Value Ref Range Status   SARS Coronavirus 2 NEGATIVE NEGATIVE Final    Comment: (NOTE) SARS-CoV-2 target nucleic acids are NOT DETECTED. The SARS-CoV-2 RNA is generally detectable in upper and lower respiratory specimens during the acute phase of infection. Negative results do not preclude SARS-CoV-2 infection, do not rule out co-infections with other pathogens, and should not be used as the sole basis for treatment or other patient management decisions. Negative results must be  combined with clinical observations, patient history, and epidemiological information. The expected result is Negative. Fact Sheet for Patients: SugarRoll.be Fact Sheet for Healthcare Providers: https://www.woods-mathews.com/ This test is not yet approved or cleared by the [place] FDA and  has been authorized for detection and/or diagnosis of SARS-CoV-2 by FDA under an Emergency Use Authorization (EUA). This EUA will remain  in effect (meaning this test can be used) for the duration of the COVID-19 declaration under Section 56 4(b)(1) of the Act, 21 U.S.C. section 360bbb-3(b)(1), unless the authorization is terminated or revoked sooner. Performed at [facility], [place] [address], [place], [place] [address]   MRSA PCR Screening     Status: None   Collection Time: 09/28/19  3:08 AM   Specimen: Nasal Mucosa; Nasopharyngeal  Result Value Ref Range Status   MRSA by PCR NEGATIVE NEGATIVE Final    Comment:        The GeneXpert MRSA Assay (FDA approved for NASAL specimens only), is one component of a comprehensive MRSA colonization surveillance program. It is not intended to diagnose MRSA infection nor to guide or monitor treatment for MRSA infections. Performed at [facility], [place],  [address]   SARS CORONAVIRUS 2 (TAT 6-24 HRS) Nasopharyngeal Nasopharyngeal Swab     Status: None   Collection Time: 10/01/19 12:28 PM   Specimen: Nasopharyngeal Swab  Result Value Ref Range Status   SARS Coronavirus 2 NEGATIVE NEGATIVE Final    Comment: (NOTE) SARS-CoV-2 target nucleic acids are NOT DETECTED. The SARS-CoV-2 RNA is generally detectable in upper and lower respiratory specimens during the acute phase of infection. Negative results do not preclude SARS-CoV-2 infection, do not rule out co-infections with other pathogens, and should not be used as the sole basis for treatment or other patient  management decisions. Negative results must be combined with clinical observations, patient history, and epidemiological information. The expected result is Negative. Fact Sheet for Patients: SugarRoll.be Fact Sheet for Healthcare Providers: https://www.woods-mathews.com/ This test is not yet approved or cleared by the [place] and  has been authorized  for detection and/or diagnosis of SARS-CoV-2 by FDA under an Emergency Use Authorization (EUA). This EUA will remain  in effect (meaning this test can be used) for the duration of the COVID-19 declaration under Section 56 4(b)(1) of the Act, 21 U.S.C. section 360bbb-3(b)(1), unless the authorization is terminated or revoked sooner. Performed at [facility], [place] [address] [place], [place] [address]     Coagulation Studies: No results for input(s): LABPROT, INR in the last 72 hours.  Urinalysis: No results for input(s): COLORURINE, LABSPEC, PHURINE, GLUCOSEU, HGBUR, BILIRUBINUR, KETONESUR, PROTEINUR, UROBILINOGEN, NITRITE, LEUKOCYTESUR in the last 72 hours.  Invalid input(s): APPERANCEUR    Imaging: CT ANGIO CHEST PE W OR WO CONTRAST  Result Date: 10/03/2019 CLINICAL DATA:  Respiratory failure. EXAM: CT ANGIOGRAPHY CHEST WITH CONTRAST TECHNIQUE: Multidetector CT imaging of the chest was performed using the standard protocol during bolus administration of intravenous contrast. Multiplanar CT image reconstructions and MIPs were obtained to evaluate the vascular anatomy. CONTRAST:  15m OMNIPAQUE IOHEXOL 350 MG/ML SOLN COMPARISON:  August 24, 2019. FINDINGS: Cardiovascular: Satisfactory opacification of the pulmonary arteries to the segmental level. No evidence of pulmonary embolism. Mild cardiomegaly. Atherosclerosis of thoracic aorta is noted without aneurysm or dissection. Patent left brachiocephalic vein stent is noted. No pericardial effusion. Mediastinum/Nodes: Small  sliding-type hiatal hernia. No significant adenopathy is noted. Thyroid gland is unremarkable. Lungs/Pleura: Small left pleural effusion is noted. Mild bilateral posterior basilar subsegmental atelectasis is noted. Possible bilateral pulmonary edema is noted in both upper lobes. Upper Abdomen: No acute abnormality. Musculoskeletal: No chest wall abnormality. No acute or significant osseous findings. Review of the MIP images confirms the above findings. IMPRESSION: 1. No definite evidence of pulmonary embolus. 2. Small left pleural effusion is noted with mild bilateral posterior basilar subsegmental atelectasis. 3. Possible bilateral pulmonary edema is noted in both upper lobes. 4. Small sliding-type hiatal hernia. Aortic Atherosclerosis (ICD10-I70.0). Electronically Signed   By: JMarijo ConceptionM.D.   On: 10/03/2019 12:48   UKoreaVenous Img Lower Bilateral (DVT)  Result Date: 10/03/2019 CLINICAL DATA:  Bilateral lower extremity pain. History of malignancy. Evaluate for DVT. EXAM: BILATERAL LOWER EXTREMITY VENOUS DOPPLER ULTRASOUND TECHNIQUE: Gray-scale sonography with graded compression, as well as color Doppler and duplex ultrasound were performed to evaluate the lower extremity deep venous systems from the level of the common femoral vein and including the common femoral, femoral, profunda femoral, popliteal and calf veins including the posterior tibial, peroneal and gastrocnemius veins when visible. The superficial great saphenous vein was also interrogated. Spectral Doppler was utilized to evaluate flow at rest and with distal augmentation maneuvers in the common femoral, femoral and popliteal veins. COMPARISON:  None. FINDINGS: RIGHT LOWER EXTREMITY Common Femoral Vein: No evidence of thrombus. Normal compressibility, respiratory phasicity and response to augmentation. Saphenofemoral Junction: No evidence of thrombus. Normal compressibility and flow on color Doppler imaging. Profunda Femoral Vein: No evidence  of thrombus. Normal compressibility and flow on color Doppler imaging. Femoral Vein: No evidence of thrombus. Normal compressibility, respiratory phasicity and response to augmentation. Popliteal Vein: No evidence of thrombus. Normal compressibility, respiratory phasicity and response to augmentation. Calf Veins: No evidence of thrombus. Normal compressibility and flow on color Doppler imaging. Superficial Great Saphenous Vein: No evidence of thrombus. Normal compressibility. Venous Reflux:  None. Other Findings:  None. LEFT LOWER EXTREMITY Common Femoral Vein: No evidence of thrombus. Normal compressibility, respiratory phasicity and response to augmentation. Saphenofemoral Junction: No evidence of thrombus. Normal compressibility and flow on color Doppler  imaging. Profunda Femoral Vein: No evidence of thrombus. Normal compressibility and flow on color Doppler imaging. Femoral Vein: No evidence of thrombus. Normal compressibility, respiratory phasicity and response to augmentation. Popliteal Vein: No evidence of thrombus. Normal compressibility, respiratory phasicity and response to augmentation. Calf Veins: No evidence of thrombus. Normal compressibility and flow on color Doppler imaging. Superficial Great Saphenous Vein: No evidence of thrombus. Normal compressibility. Venous Reflux:  None. Other Findings:  None. IMPRESSION: No evidence of DVT within either lower extremity. Electronically Signed   By: Sandi Mariscal M.D.   On: 10/03/2019 11:35   DG Chest Port 1 View  Result Date: 10/02/2019 CLINICAL DATA:  Shortness of breath. Concern for GI bleed. EXAM: PORTABLE CHEST 1 VIEW COMPARISON:  Radiograph yesterday, multiple prior exams. CT 08/24/2019 FINDINGS: Stable cardiomegaly. Unchanged mediastinal contours. Vascular stent in the region of the left subclavian unchanged. Unchanged elevation of left hemidiaphragm. Diffuse interstitial thickening consistent with pulmonary edema, unchanged. Possible trace pleural  effusions. Subsegmental opacities at the left lung base. No pneumothorax. Patient's chin obscures the apices. Vascular stent in the left axilla and upper arm, partially included. IMPRESSION: Unchanged cardiomegaly and pulmonary edema since yesterday. Possible trace pleural effusions. Electronically Signed   By: Keith Rake M.D.   On: 10/02/2019 19:46     Medications:   . cefTRIAXone (ROCEPHIN)  IV 1 g (10/03/19 1700)   . amLODipine  5 mg Per Tube Daily  . B-complex with vitamin C  1 tablet Per Tube QHS  . budesonide (PULMICORT) nebulizer solution  0.5 mg Nebulization BID  . Chlorhexidine Gluconate Cloth  6 each Topical Daily  . epoetin (EPOGEN/PROCRIT) injection  10,000 Units Intravenous Q M,W,F-HD  . feeding supplement (NEPRO CARB STEADY)  1,000 mL Per Tube Q24H  . feeding supplement (PRO-STAT SUGAR FREE 64)  30 mL Per Tube BID  . free water  50 mL Per Tube 6 X Daily  . gabapentin  100 mg Per Tube Daily  . insulin aspart  0-6 Units Subcutaneous Q4H  . ipratropium  0.5 mg Nebulization Q6H  . lacosamide  100 mg Per Tube BID  . levalbuterol  0.63 mg Nebulization Q6H  . mouth rinse  15 mL Mouth Rinse BID  . methylPREDNISolone (SOLU-MEDROL) injection  60 mg Intravenous Q6H  . pantoprazole sodium  40 mg Per Tube BID     Assessment/ Plan:  Barbara [name] is a 79 y.o. black female with end stage renal disease on hemodialysis, hypertension, diabetes mellitus type II, diabetic neuropathy, seizure disorder, left hip repair, anemia of chronic kidney disease, secondary hyperparathyroidism  [facility] MWF [facility] LUE AVG 79.5kg   1.  ESRD on HD MWF.  Seen and examined on hemodialysis. Seated in chair.   2. Anemia of chronic kidney disease.  With GI bleed. Hemoglobin 8.3 Requiring blood transfusion on admission.  - Continue EPO - pantoprazole.   3.  Secondary hyperparathyroidism. Calcium and phosphorus at goal. Outpatient PTH was low at 72. Holding binders and activated  vitamin D.   4. Hypertension: 161/72 - amlodipine.     LOS: 6 Becca Bayne 3/17/202112:58 PM

## 2019-10-04 NOTE — Progress Notes (Signed)
PT Cancellation Note  Patient Details Name: Barbara [name] MRN: [id number] DOB: [birth year]   Cancelled Treatment:   PT attempt. pt off floor in dialysis. Will return after HD if time allows. PT will continue to follow pt per POC. Per RN, pt was a max assist to get to HD chair. Will address transfer deficits next session.       Willette Pa 10/04/2019, 3:44 PM

## 2019-10-04 NOTE — Progress Notes (Addendum)
[facility] Progress Note  Patient: Barbara [name]    HGD:[id number]  DOA: 09/27/2019     Date of Service: the patient was seen and examined on 10/04/2019  Chief Complaint  Patient presents with  . Hematemesis   Barbara [name] is a 79 y.o. female with medical history significant for ESRD on HD MWF, hyperlipidemia, type 2 diabetes, COPD, asthma, GERD, bladder cancer, and iron deficiency anemia who presents for concerns of acute GI bleed and anemia. SP EGD and replacement of the G-tube.  In last 24 hours patient developed respiratory failure with bilateral wheezes currently being transferred to stepdown unit. Currently further plan is continue treat respiratory failure and monitor for stability transferring to SNF  Assessment and Plan: Acute GI bleed Hemoglobin of 7.9 and transfuse during dialysis. Cont iv ppi.  Acute blood loss anemia. Anemia of chronic kidney disease Presented with GI bleed.  Hemoglobin dropped down to 7.9.  Transfuse  2PRBC.    PEG tube dependency with dysphagia Placed on 09/20/2019 due to dysphagia Appreciate GI assistance for replacement of the G-tube. GI recommended to resume diet. Currently tolerating well.  Acute hypoxic respiratory failure/Acute on chronic systolic CHF/Acute bronchitis: Noted by nursing that she chronically is on 2L but this is not noted in last discharge note. Patient currently requiring 6 L of oxygen to maintain adequate saturation. We will continue with Solu-Medrol 60 mg every 6 hours. Antibiotic changed to IV ceftriaxone. CCM consulted for further assistance. CT PE negative for pulmonary embolism.  Lower extremity Doppler negative for DVT as well.  Leukocytosis/concern for cellulitis This appears to have been chronic for the past week.  No signs of fever.  Continue IV antibiotics  ESRD on HD MWF Creatinine is stable Need to monitor for any fluid overload with blood transfusion Appreciate nephrology assistance. Patient will  require to sit in the chair.  Chronic diastolic CHF:  Hemodialysis for  Volume management  Type 2 diabetes controlled. diet-controlled  Currently on sliding scale insulin. Also on Nepro for tube feeding. accuchecks ac/hs  Hx of seizure Continue home regimen per tube.  Stage 1 sacral and buttock ulcers  Wound care per RN   Dysphagia. Patient is significantly awake and alert. Speech therapy consulted for assessment for the diet. Per speech therapy patient was not eating enough likely from not being hungry while she was on tube feeding. Given lethargy currently holding off on further work-up.  Appreciate speech therapy recommendation.  Acute encephalopathy.  Likely toxic. From medication.  Received fentanyl early this morning. Continues to report severe right leg cramps.  And given Robaxin. Continue to monitor for now.  Body mass index is 27.9 kg/m.  Nutrition Problem: Inadequate oral intake Etiology: dysphagia Interventions: Interventions: Tube feeding, Prostat, MVI  Pressure Injury 09/21/19 Buttocks Right Stage 2 -  Partial thickness loss of dermis presenting as a shallow open injury with a red, pink wound bed without slough. 0.5cmx0.5cm (Active)  09/21/19 0830  Location: Buttocks  Location Orientation: Right  Staging: Stage 2 -  Partial thickness loss of dermis presenting as a shallow open injury with a red, pink wound bed without slough.  Wound Description (Comments): 0.5cmx0.5cm  Present on Admission: No     Diet: Currently n.p.o.  On tube feeding. DVT Prophylaxis: SCD, pharmacological prophylaxis contraindicated due to GI bleed  Disposition: D/C dispo is home in 2 days dermatitis/c barrier is her anemia and volume management with dialysis and prbc transfusion.   Physical Exam: General:  alert oriented to  person.  Appear in mild distress, affect appropriate Eyes: PERRL ENT: Oral Mucosa Clear, dry Neck: difficult to assess JVD,  Cardiovascular: S1 and S2  Present, no Murmur,  Respiratory: good respiratory effort, Bilateral Air entry equal and Decreased, no Crackles, inspiratory as well as expiratory wheezes primarily seen more and upper airway area, Abdomen: Bowel Sound present, Soft and no tenderness,  Skin: no rash Extremities: no Pedal edema, no calf tenderness Neurologic: without any new focal findings  Gait not checked due to patient safety concerns  Vitals:   10/04/19 1300 10/04/19 1315 10/04/19 1330 10/04/19 1345  BP: (!) 161/72 (!) 175/67 (!) 150/66 (!) 161/65  Pulse: 71 73 73 76  Resp: 15 19 20 17   Temp:      TempSrc:      SpO2:      Weight:      Height:        Intake/Output Summary (Last 24 hours) at 10/04/2019 1359 Last data filed at 10/04/2019 0245 Gross per 24 hour  Intake 4939 ml  Output --  Net 4939 ml   Filed Weights   10/01/19 0510 10/02/19 0930 10/03/19 0425  Weight: 84.5 kg 78.5 kg 78.4 kg     CBC: Recent Labs  Lab 09/30/19 0837 10/01/19 0903 10/02/19 0500 10/03/19 0423 10/04/19 0805  WBC 12.6* 14.2* 16.8* 8.6 11.3*  HGB 9.7* 8.9* 8.6* 8.3* 7.9*  HCT 29.9* 27.6* 27.3* 26.7* 26.1*  MCV 89.3 89.9 92.2 92.7 96.0  PLT 269 289 315 255 712   Basic Metabolic Panel: Recent Labs  Lab 09/29/19 0501 09/29/19 0501 09/30/19 0837 10/01/19 0903 10/02/19 0500 10/02/19 0930 10/03/19 0423 10/04/19 0805  NA 133*   < > 138 135  --  136 141 139  K 4.5   < > 4.3 4.9  --  3.7 4.0 3.9  CL 99   < > 98 97*  --  97* 98 95*  CO2 21*   < > 28 22  --  27 31 27   GLUCOSE 119*   < > 109* 134*  --  168* 174* 142*  BUN 53*   < > 37* 58*  --  71* 48* 91*  CREATININE 6.85*   < > 5.00* 6.69*  --  8.21* 5.47* 7.02*  CALCIUM 8.5*   < > 8.1* 8.3*  --  8.5* 8.8* 9.0  MG 1.9  --   --   --  2.0  --  2.0  --   PHOS  --   --   --   --   --  3.2 3.9 3.3   < > = values in this interval not displayed.     No results found.  Scheduled Meds: . amLODipine  5 mg Per Tube Daily  . B-complex with vitamin C  1 tablet Per Tube QHS   . budesonide (PULMICORT) nebulizer solution  0.5 mg Nebulization BID  . Chlorhexidine Gluconate Cloth  6 each Topical Daily  . epoetin (EPOGEN/PROCRIT) injection  10,000 Units Intravenous Q M,W,F-HD  . feeding supplement (NEPRO CARB STEADY)  1,000 mL Per Tube Q24H  . feeding supplement (PRO-STAT SUGAR FREE 64)  30 mL Per Tube BID  . free water  50 mL Per Tube 6 X Daily  . gabapentin  100 mg Per Tube Daily  . insulin aspart  0-6 Units Subcutaneous Q4H  . ipratropium  0.5 mg Nebulization Q6H  . lacosamide  100 mg Per Tube BID  . levalbuterol  0.63 mg Nebulization Q6H  .  mouth rinse  15 mL Mouth Rinse BID  . methylPREDNISolone (SOLU-MEDROL) injection  60 mg Intravenous Q6H  . pantoprazole sodium  40 mg Per Tube BID   Continuous Infusions: . cefTRIAXone (ROCEPHIN)  IV 1 g (10/03/19 1700)   PRN Meds: acetaminophen (TYLENOL) oral liquid 160 mg/5 mL, albuterol, diclofenac Sodium Time spent: 30 minutes  Tayton Decaire, MD 7AM-7PM [phone]. [facility] 10/04/2019 1:59 PM To reach On-call, see care teams to locate the attending and reach out to them via [email or web address]. If 7PM-7AM, please contact night-coverage If you still have difficulty reaching the attending provider, please page the [facility] (Director on Call) for [facility] on amion for assistance.

## 2019-10-04 NOTE — Progress Notes (Signed)
Patient has a pending [employer] Palliative referral from last admission. TOC Deliliah Belenda Cruise aware. Flo Shanks BSN, RN, [place] [employer] [phone]

## 2019-10-04 NOTE — Progress Notes (Signed)
Nutrition Follow-up  DOCUMENTATION CODES:   Not applicable  INTERVENTION:  Continue Nepro at 60 mL/hr x 16 hrs (1600-0800). This is 960 mL goal daily volume. Also provide Pro-Stat 30 mL BID per tube. Goal regiment provides 1928 kcal, 108 grams of protein, 701 mL H2O daily.  Flush tube with 50 mL Q3hrs while tube feeds are running from 1600-0800. This will provide 300 mL additional water for a total of approximately 1001 mL daily not including water patient receives with medications.  Continue B-complex with C QHS per tube.  NUTRITION DIAGNOSIS:   Inadequate oral intake related to dysphagia as evidenced by (reliance on tube feed regimen per PEG tube to meet calorie/protein/hydration needs.).  Ongoing - addressing with TF regimen.  GOAL:   Patient will meet greater than or equal to 90% of their needs  Met with TF regimen.  MONITOR:   Labs, Weight trends, TF tolerance, Skin, I & O's  REASON FOR ASSESSMENT:   Consult Enteral/tube feeding initiation and management  ASSESSMENT:   79 year old female with PMHx of COPD, HTN, arthritis, GERD, CAD, asthma, ESRD on HD, celiac disease, DM, sleep apnea, bladder cancer s/p chemo/XRT, recent left total hip arthroplasty on 09/05/2019, dysphagia s/p PEG tube placement on 09/20/2019 who is admitted from [facility] after pulling out PEG tube and with coffee-ground emesis. Patient s/p replacement of PEG tube on 3/11.  Patient continues to tolerate nocturnal tube feed regimen. Noted in chart plan was to hold off on attempts at resuming PO diet until patient back at SNF.   Medications reviewed and include: B-complex with C QHS, Epogen 10000 units during HD, gabapentin, Novolog 0-6 units Q4hrs, Vimpat, Solu-Medrol 60 mg Q6hrs, Protonix, ceftriaxone.  Labs reviewed: CBG 146-177, Chloride 95, BUN 91, Creatinine 7.02.  Enteral Access: PEG tube replaced on 09/28/2019  Discussed with RN.  Diet Order:   Diet Order            Diet NPO time  specified  Diet effective now             EDUCATION NEEDS:   No education needs have been identified at this time  Skin:  Skin Assessment: Skin Integrity Issues:(stg II buttocks 100% epithelialized)  Last BM:  10/04/2019  medium type 7  Height:   Ht Readings from Last 1 Encounters:  09/28/19 _0  (1.676 m)   Weight:   Wt Readings from Last 1 Encounters:  10/03/19 78.4 kg   Ideal Body Weight:  59.1 kg  BMI:  Body mass index is 27.9 kg/m.  Estimated Nutritional Needs:   Kcal:  1700-1900  Protein:  95-110 grams  Fluid:  UOP + 1 L  Jacklynn Barnacle, MS, RD, LDN Pager number available on Amion

## 2019-10-04 NOTE — Progress Notes (Signed)
Down via chair to dialysis

## 2019-10-05 LAB — RENAL FUNCTION PANEL
Albumin: 3 g/dL — ABNORMAL LOW (ref 3.5–5.0)
Anion gap: 17 — ABNORMAL HIGH (ref 5–15)
BUN: 81 mg/dL — ABNORMAL HIGH (ref 8–23)
CO2: 27 mmol/L (ref 22–32)
Calcium: 9 mg/dL (ref 8.9–10.3)
Chloride: 96 mmol/L — ABNORMAL LOW (ref 98–111)
Creatinine, Ser: 5.68 mg/dL — ABNORMAL HIGH (ref 0.44–1.00)
GFR calc Af Amer: 8 mL/min — ABNORMAL LOW (ref >60)
GFR calc non Af Amer: 7 mL/min — ABNORMAL LOW (ref >60)
Glucose, Bld: 153 mg/dL — ABNORMAL HIGH (ref 70–99)
Phosphorus: 3.2 mg/dL (ref 2.5–4.6)
Potassium: 3.5 mmol/L (ref 3.5–5.1)
Sodium: 140 mmol/L (ref 135–145)

## 2019-10-05 LAB — GLUCOSE, CAPILLARY
Glucose-Capillary: 136 mg/dL — ABNORMAL HIGH (ref 70–99)
Glucose-Capillary: 142 mg/dL — ABNORMAL HIGH (ref 70–99)
Glucose-Capillary: 145 mg/dL — ABNORMAL HIGH (ref 70–99)
Glucose-Capillary: 146 mg/dL — ABNORMAL HIGH (ref 70–99)
Glucose-Capillary: 172 mg/dL — ABNORMAL HIGH (ref 70–99)
Glucose-Capillary: 189 mg/dL — ABNORMAL HIGH (ref 70–99)

## 2019-10-05 LAB — CBC
HCT: 26 % — ABNORMAL LOW (ref 36.0–46.0)
Hemoglobin: 8.1 g/dL — ABNORMAL LOW (ref 12.0–15.0)
MCH: 28.7 pg (ref 26.0–34.0)
MCHC: 31.2 g/dL (ref 30.0–36.0)
MCV: 92.2 fL (ref 80.0–100.0)
Platelets: 320 10*3/uL (ref 150–400)
RBC: 2.82 MIL/uL — ABNORMAL LOW (ref 3.87–5.11)
RDW: 16.5 % — ABNORMAL HIGH (ref 11.5–15.5)
WBC: 13.1 10*3/uL — ABNORMAL HIGH (ref 4.0–10.5)
nRBC: 0.2 % (ref 0.0–0.2)

## 2019-10-05 NOTE — Care Management Important Message (Signed)
Important Message  Patient Details  Name: Barbara [name] MRN: [id number] Date of Birth: 10-17-1940   Medicare Important Message Given:  Yes Notified husband by phone.      Shelbie Ammons, RN 10/05/2019, 10:51 AM

## 2019-10-05 NOTE — TOC Progression Note (Signed)
Transition of Care [facility]) - Progression Note    Patient Details  Name: Barbara [name] MRN: [id number] Date of Birth: [birth year]  Transition of Care [facility]) CM/SW [place], RN Phone Number: 10/05/2019, 12:33 PM  Clinical Narrative:    Met with the patient and her husband in the room to discuss DC plans, they stated her plan is to go to [facility] at DC, her husband stated that her clothes were lost between ICU and here, I called ICU to inquire they will check and call me back   Expected Discharge Plan: Skilled Nursing Facility Barriers to Discharge: Continued Medical Work up  Expected Discharge Plan and Services Expected Discharge Plan: [place] arrangements for the past 2 months: [place] Determinants of Health (SDOH) Interventions    Readmission Risk Interventions Readmission Risk Prevention Plan 09/29/2019  Transportation Screening Complete  Medication Review (RN Care Manager) Complete  Palliative Care Screening Not Applicable  Some recent data might be hidden

## 2019-10-05 NOTE — TOC Progression Note (Signed)
Transition of Care [facility]) - Progression Note    Patient Details  Name: Barbara [name] MRN: [id number] Date of Birth: [birth year]  Transition of Care [facility]) CM/SW [place], RN Phone Number: 10/05/2019, 2:19 PM  Clinical Narrative:     ICU called me back and stated that they do not have any clothing from the patient, I notified the TOC from the ED that we are looking for misplaced clothing from the patient  Expected Discharge Plan: [place] Barriers to Discharge: Continued Medical Work up  Expected Discharge Plan and Services Expected Discharge Plan: [place] arrangements for the past 2 months: [place] Determinants of Health (SDOH) Interventions    Readmission Risk Interventions Readmission Risk Prevention Plan 09/29/2019  Transportation Screening Complete  Medication Review (RN Care Manager) Complete  Palliative Care Screening Not Applicable  Some recent data might be hidden

## 2019-10-05 NOTE — Progress Notes (Addendum)
PROGRESS NOTE    Barbara [name]  HLK:[id number] DOB: [birth year] DOA: 09/27/2019 PCP: [name], MD   Brief Narrative: Barbara Colclasure Conyersis a 79 y.o.femalewith medical history significant forESRD on HD MWF, hyperlipidemia, type 2 diabetes, COPD, asthma, GERD, bladder cancer, and iron deficiency anemia who presents for concerns of acute GI bleed and anemia. SP EGD and replacement of the G-tube. Pt came with anemia and hg of 4.8  And received 2 untis of PRBC in ed. She was also recently  hospitalized from 02/16 to 03/7 following a left total hip arthroplasty  (73/[id number]) complicated by toxic metabolic encephalopathy due to severe metabolic acidosis, and seizure activity (started on vimpat) requiring mechanical intubation/CRRTand PEG tube placement due to dysphagia then discharged to [facility] facility.   She presented to [facility] ER on 03/10 via EMS from [facility] with coffee-ground emesis onset 2 days prior to presentation.  Lab results at the facility revealed hgb of 5 prompting ER visit.  It was also reported the pt pulled out her PEG tube.  In the ER lab results revealed Na+ 133, glucose 121, BUN 39, creatinine 4.37, albumin 2.5, lipase 63, creatinine 4.37, wbc 19.7, hgb 4.8 and COVID-19 negative.  ER physician also reported the pt had a grossly melanotic stool. Pt received 2 unit of pRBC's, protonix gtt started, and GI consulted.   She has egd on 09/28/19 which showed non-bleeding gastric ulcer.  Then patient developed respiratory failure with bilateral wheezes currently being transferred to stepdown unit. I suspect respiratory failure from volume overload from transfusion.  Pt transferred to floor on 3/17 and has been stable and Currently further plan is continue treat respiratory failure and monitor for stability [facility]. Pt is table and needs authorization for discharge to snf, spouse st bedside today and states daughter and himself cant take care of her. Per  requirement pt needs covid-19 pcr pending for d/c in am to a SNF.   Pt continues to be stable will get dialysis in am and get discharged.   Assessment & Plan:   Principal Problem:   Acute GI bleeding Active Problems:   Leukocytosis   ESRD (end stage renal disease) (HCC)   Type II diabetes mellitus with renal manifestations (HCC)   Sacral ulcer (HCC)   Acute respiratory failure with hypoxia (HCC)   Chronic diastolic CHF (congestive heart failure) (HCC)   Seizure (HCC)   PEG tube malfunction (HCC)   Acute GI bleed Hemoglobin of 8.1 and transfuse during dialysis. Cont iv ppi. Attribute to combination of GI bleed and iron def anemia and ACD due to her CKD,.   Acute blood loss anemia. Anemia of chronic kidney disease Presented with GI bleed.  Hemoglobin dropped down to 8.1  pt on epo and transfuse as needed.  PEG tubedependency with dysphagia Placed on 09/20/2019 due to dysphagia Appreciate GI assistance for replacement of the G-tube. GI recommended to resume diet. Currently tolerating well.  Acute hypoxic respiratory failure/Acute on chronic systolic CHF/Acute bronchitis: Resolved . Pt is on Room Air with normal oxygen saturations.   Leukocytosis/concern for cellulitis This appears to have been chronic for the past week. No signs of fever.  Continue IV antibiotics  ESRD on HD MWF Creatinine is stable Need to monitor for any fluid overload with blood transfusion Appreciate nephrology assistance. Patient will require to sit in the chair.  Chronic diastolic CHF:  Hemodialysis for  Volume management  Type 2 diabetes controlled. diet-controlled  Currently on sliding  scale insulin. Also on Nepro for tube feeding. accuchecks ac/hs  Hx of seizure Continue home regimen per tube.  Stage 1 sacral and buttock ulcers  Wound care per RN  Dysphagia. Patient is significantly awake and alert. Speech therapy consulted for assessment for the diet. Per speech  therapy patient was not eating enough likely from not being hungry while she was on tube feeding. Given lethargy currently holding off on further work-up.  Appreciate speech therapy recommendation.  Acute encephalopathy.  Likely toxic. From medication.  Received fentanyl early this morning. Continues to report severe right leg cramps.  And given Robaxin. Continue to monitor for now.   DVT prophylaxis: SCD's Code Status: Full Code Family Communication: Spouse at bedside Disposition Plan: [facility] to [facility], Insurance authorization for d/c to facilty in am and covid-19 pcr test.   Consultants:   Gertie Fey  nephro   Procedures: Dialysis M/W/F   Subjective: Pt seen today for f/u and is stable spouse wants her to go to snf.  Pt denies any complaints of chest pain or sob.   Objective: Vitals:   10/04/19 2350 10/05/19 0600 10/05/19 0745 10/05/19 0836  BP: (!) 151/66   (!) 152/71  Pulse: 82   84  Resp: 20   16  Temp: 97.8 F (36.6 C)   97.6 F (36.4 C)  TempSrc: Oral     SpO2: 98%  95% 94%  Weight:  76.2 kg    Height:       No intake or output data in the 24 hours ending 10/05/19 1622 Filed Weights   10/02/19 0930 10/03/19 0425 10/05/19 0600  Weight: 78.5 kg 78.4 kg 76.2 kg    Examination: Blood pressure (!) 152/71, pulse 84, temperature 97.6 F (36.4 C), resp. rate 16, height 5' 6"  (1.676 m), weight 76.2 kg, SpO2 94 %. General exam: Appears calm and comfortable  Respiratory system: Clear to auscultation. Respiratory effort normal. Cardiovascular system: S1 & S2 heard, RRR. No JVD, murmurs, rubs, gallops or clicks. No pedal edema. Gastrointestinal system: Abdomen is nondistended, soft and nontender. No organomegaly or masses felt. Normal bowel sounds heard. Central nervous system: Alert and oriented. No focal neurological deficits. Extremities: Symmetric 5 x 5 power. Skin: No rashes, lesions or ulcers Psychiatry: Judgement and insight appear normal. Mood & affect  appropriate.     Data Reviewed:  CBC: Recent Labs  Lab 10/01/19 0903 10/02/19 0500 10/03/19 0423 10/04/19 0805 10/05/19 1455  WBC 14.2* 16.8* 8.6 11.3* 13.1*  HGB 8.9* 8.6* 8.3* 7.9* 8.1*  HCT 27.6* 27.3* 26.7* 26.1* 26.0*  MCV 89.9 92.2 92.7 96.0 92.2  PLT 289 315 255 307 937   Basic Metabolic Panel: Recent Labs  Lab 09/29/19 0501 09/30/19 0837 10/01/19 0903 10/02/19 0500 10/02/19 0930 10/03/19 0423 10/04/19 0805 10/05/19 1455  NA 133*   < > 135  --  136 141 139 140  K 4.5   < > 4.9  --  3.7 4.0 3.9 3.5  CL 99   < > 97*  --  97* 98 95* 96*  CO2 21*   < > 22  --  27 31 27 27   GLUCOSE 119*   < > 134*  --  168* 174* 142* 153*  BUN 53*   < > 58*  --  71* 48* 91* 81*  CREATININE 6.85*   < > 6.69*  --  8.21* 5.47* 7.02* 5.68*  CALCIUM 8.5*   < > 8.3*  --  8.5* 8.8* 9.0 9.0  MG  1.9  --   --  2.0  --  2.0  --   --   PHOS  --   --   --   --  3.2 3.9 3.3 3.2   < > = values in this interval not displayed.   GFR: Estimated Creatinine Clearance: 8.4 mL/min (A) (by C-G formula based on SCr of 5.68 mg/dL (H)). Liver Function Tests: Recent Labs  Lab 10/02/19 0930 10/03/19 0423 10/04/19 0805 10/05/19 1455  ALBUMIN 2.3* 2.4* 2.7* 3.0*   No results for input(s): LIPASE, AMYLASE in the last 168 hours. No results for input(s): AMMONIA in the last 168 hours. Coagulation Profile: No results for input(s): INR, PROTIME in the last 168 hours. Cardiac Enzymes: No results for input(s): CKTOTAL, CKMB, CKMBINDEX, TROPONINI in the last 168 hours. BNP (last 3 results) No results for input(s): PROBNP in the last 8760 hours. HbA1C: Recent Labs    10/03/19 0423  HGBA1C 5.7*   CBG: Recent Labs  Lab 10/04/19 2112 10/05/19 0016 10/05/19 0427 10/05/19 0832 10/05/19 1158  GLUCAP 137* 142* 172* 189* 136*   Lipid Profile: No results for input(s): CHOL, HDL, LDLCALC, TRIG, CHOLHDL, LDLDIRECT in the last 72 hours. Thyroid Function Tests: No results for input(s): TSH, T4TOTAL,  FREET4, T3FREE, THYROIDAB in the last 72 hours. Anemia Panel: No results for input(s): VITAMINB12, FOLATE, FERRITIN, TIBC, IRON, RETICCTPCT in the last 72 hours. Sepsis Labs: No results for input(s): PROCALCITON, LATICACIDVEN in the last 168 hours.  Recent Results (from the past 240 hour(s))  SARS CORONAVIRUS 2 (TAT 6-24 HRS) Nasopharyngeal Nasopharyngeal Swab     Status: None   Collection Time: 09/28/19  1:40 AM   Specimen: Nasopharyngeal Swab  Result Value Ref Range Status   SARS Coronavirus 2 NEGATIVE NEGATIVE Final    Comment: (NOTE) SARS-CoV-2 target nucleic acids are NOT DETECTED. The SARS-CoV-2 RNA is generally detectable in upper and lower respiratory specimens during the acute phase of infection. Negative results do not preclude SARS-CoV-2 infection, do not rule out co-infections with other pathogens, and should not be used as the sole basis for treatment or other patient management decisions. Negative results must be combined with clinical observations, patient history, and epidemiological information. The expected result is Negative. Fact Sheet for Patients: SugarRoll.be Fact Sheet for Healthcare Providers: https://www.woods-mathews.com/ This test is not yet approved or cleared by the [place] FDA and  has been authorized for detection and/or diagnosis of SARS-CoV-2 by FDA under an Emergency Use Authorization (EUA). This EUA will remain  in effect (meaning this test can be used) for the duration of the COVID-19 declaration under Section 56 4(b)(1) of the Act, 21 U.S.C. section 360bbb-3(b)(1), unless the authorization is terminated or revoked sooner. Performed at [facility], [place] [address] [place], [place] [address]   MRSA PCR Screening     Status: None   Collection Time: 09/28/19  3:08 AM   Specimen: Nasal Mucosa; Nasopharyngeal  Result Value Ref Range Status   MRSA by PCR NEGATIVE NEGATIVE Final    Comment:         The GeneXpert MRSA Assay (FDA approved for NASAL specimens only), is one component of a comprehensive MRSA colonization surveillance program. It is not intended to diagnose MRSA infection nor to guide or monitor treatment for MRSA infections. Performed at [facility], [place], [place] [address]   SARS CORONAVIRUS 2 (TAT 6-24 HRS) Nasopharyngeal Nasopharyngeal Swab     Status: None   Collection Time:  10/01/19 12:28 PM   Specimen: Nasopharyngeal Swab  Result Value Ref Range Status   SARS Coronavirus 2 NEGATIVE NEGATIVE Final    Comment: (NOTE) SARS-CoV-2 target nucleic acids are NOT DETECTED. The SARS-CoV-2 RNA is generally detectable in upper and lower respiratory specimens during the acute phase of infection. Negative results do not preclude SARS-CoV-2 infection, do not rule out co-infections with other pathogens, and should not be used as the sole basis for treatment or other patient management decisions. Negative results must be combined with clinical observations, patient history, and epidemiological information. The expected result is Negative. Fact Sheet for Patients: SugarRoll.be Fact Sheet for Healthcare Providers: https://www.woods-mathews.com/ This test is not yet approved or cleared by the [place] FDA and  has been authorized for detection and/or diagnosis of SARS-CoV-2 by FDA under an Emergency Use Authorization (EUA). This EUA will remain  in effect (meaning this test can be used) for the duration of the COVID-19 declaration under Section 56 4(b)(1) of the Act, 21 U.S.C. section 360bbb-3(b)(1), unless the authorization is terminated or revoked sooner. Performed at [facility], [place] [address], [place], [place] [address]       Radiology Studies: No results found.  Scheduled Meds: . amLODipine  5 mg Per Tube Daily  . B-complex with vitamin C  1 tablet Per Tube QHS  .  budesonide (PULMICORT) nebulizer solution  0.5 mg Nebulization BID  . Chlorhexidine Gluconate Cloth  6 each Topical Daily  . epoetin (EPOGEN/PROCRIT) injection  10,000 Units Intravenous Q M,W,F-HD  . feeding supplement (NEPRO CARB STEADY)  1,000 mL Per Tube Q24H  . feeding supplement (PRO-STAT SUGAR FREE 64)  30 mL Per Tube BID  . free water  50 mL Per Tube 6 X Daily  . gabapentin  100 mg Per Tube Daily  . insulin aspart  0-6 Units Subcutaneous Q4H  . ipratropium  0.5 mg Nebulization Q6H  . lacosamide  100 mg Per Tube BID  . levalbuterol  0.63 mg Nebulization Q6H  . mouth rinse  15 mL Mouth Rinse BID  . methylPREDNISolone (SOLU-MEDROL) injection  60 mg Intravenous Q6H  . pantoprazole sodium  40 mg Per Tube BID   Continuous Infusions: . cefTRIAXone (ROCEPHIN)  IV 1 g (10/04/19 1700)     LOS: 7 days    Para Skeans, DO [facility] If 7PM-7AM, please contact night-coverage [email or web address] Password [facility] 10/05/2019, 4:22 PM

## 2019-10-05 NOTE — TOC Progression Note (Signed)
Transition of Care [facility]) - Progression Note    Patient Details  Name: Barbara [name] MRN: [id number] Date of Birth: 02-19-1941  Transition of Care [facility]) CM/SW [place], RN Phone Number: 10/05/2019, 2:26 PM  Clinical Narrative:     [place] insurance to request for auth approval  The patient will plan to DC to [facility] tomorrow Faxed the clinical notes to Everlene Balls [phone] Ref number [id number] Notified [facility] that the plan is to DC tomorrow A new Covid test will be done today   Expected Discharge Plan: [place] Barriers to Discharge: Continued Medical Work up  Expected Discharge Plan and Services Expected Discharge Plan: [place] arrangements for the past 2 months: [place]                                       Social Determinants of Health (SDOH) Interventions    Readmission Risk Interventions Readmission Risk Prevention Plan 09/29/2019  Transportation Screening Complete  Medication Review (RN Care Manager) Complete  Palliative Care Screening Not Applicable  Some recent data might be hidden

## 2019-10-05 NOTE — Progress Notes (Addendum)
[facility]  ROUNDING NOTE   Subjective:   Patient is wanting to go home. Reports that she tolerated treatment well yesterday.   Objective:  Vital signs in last 24 hours:  Temp:  [97.6 F (36.4 C)-97.8 F (36.6 C)] 97.6 F (36.4 C) (03/18 0836) Pulse Rate:  [71-84] 84 (03/18 0836) Resp:  [15-20] 16 (03/18 0836) BP: (132-177)/(60-80) 152/71 (03/18 0836) SpO2:  [94 %-99 %] 94 % (03/18 0836) Weight:  [76.2 kg] 76.2 kg (03/18 0600)  Weight change:  Filed Weights   10/02/19 0930 10/03/19 0425 10/05/19 0600  Weight: 78.5 kg 78.4 kg 76.2 kg    Intake/Output: I/O last 3 completed shifts: In: 376 [NG/GT:645] Out: -    Intake/Output this shift:  No intake/output data recorded.  Physical Exam: General: NAD, patient resting in bed   Head: Normocephalic, atraumatic. Moist oral mucosal membranes  Eyes: Anicteric, PERRL  Neck: Supple, trachea midline  Lungs:  Clear to auscultation, On supplemental O2 [place]   Heart: Regular rate and rhythm  Abdomen:  Soft, nontender,   Extremities:  No peripheral edema.  Neurologic: Nonfocal, moving all four extremities  Skin: No lesions  Access: Left arm AVG    Basic Metabolic Panel: Recent Labs  Lab 09/29/19 0501 09/29/19 0501 09/30/19 0837 09/30/19 0837 10/01/19 0903 10/01/19 0903 10/02/19 0500 10/02/19 0930 10/03/19 0423 10/04/19 0805  NA 133*   < > 138  --  135  --   --  136 141 139  K 4.5   < > 4.3  --  4.9  --   --  3.7 4.0 3.9  CL 99   < > 98  --  97*  --   --  97* 98 95*  CO2 21*   < > 28  --  22  --   --  27 31 27   GLUCOSE 119*   < > 109*  --  134*  --   --  168* 174* 142*  BUN 53*   < > 37*  --  58*  --   --  71* 48* 91*  CREATININE 6.85*   < > 5.00*  --  6.69*  --   --  8.21* 5.47* 7.02*  CALCIUM 8.5*   < > 8.1*   < > 8.3*   < >  --  8.5* 8.8* 9.0  MG 1.9  --   --   --   --   --  2.0  --  2.0  --   PHOS  --   --   --   --   --   --   --  3.2 3.9 3.3   < > = values in this interval not displayed.    Liver  Function Tests: Recent Labs  Lab 10/02/19 0930 10/03/19 0423 10/04/19 0805  ALBUMIN 2.3* 2.4* 2.7*   No results for input(s): LIPASE, AMYLASE in the last 168 hours. No results for input(s): AMMONIA in the last 168 hours.  CBC: Recent Labs  Lab 09/30/19 0837 10/01/19 0903 10/02/19 0500 10/03/19 0423 10/04/19 0805  WBC 12.6* 14.2* 16.8* 8.6 11.3*  HGB 9.7* 8.9* 8.6* 8.3* 7.9*  HCT 29.9* 27.6* 27.3* 26.7* 26.1*  MCV 89.3 89.9 92.2 92.7 96.0  PLT 269 289 315 255 307    Cardiac Enzymes: No results for input(s): CKTOTAL, CKMB, CKMBINDEX, TROPONINI in the last 168 hours.  BNP: Invalid input(s): POCBNP  CBG: Recent Labs  Lab 10/04/19 1641 10/04/19 2112 10/05/19 0016 10/05/19 2831  10/05/19 0832  GLUCAP 136* 137* 142* 172* 189*    Microbiology: Results for orders placed or performed during the hospital encounter of 09/27/19  SARS CORONAVIRUS 2 (TAT 6-24 HRS) Nasopharyngeal Nasopharyngeal Swab     Status: None   Collection Time: 09/28/19  1:40 AM   Specimen: Nasopharyngeal Swab  Result Value Ref Range Status   SARS Coronavirus 2 NEGATIVE NEGATIVE Final    Comment: (NOTE) SARS-CoV-2 target nucleic acids are NOT DETECTED. The SARS-CoV-2 RNA is generally detectable in upper and lower respiratory specimens during the acute phase of infection. Negative results do not preclude SARS-CoV-2 infection, do not rule out co-infections with other pathogens, and should not be used as the sole basis for treatment or other patient management decisions. Negative results must be combined with clinical observations, patient history, and epidemiological information. The expected result is Negative. Fact Sheet for Patients: SugarRoll.be Fact Sheet for Healthcare Providers: https://www.woods-mathews.com/ This test is not yet approved or cleared by the [place] FDA and  has been authorized for detection and/or diagnosis of SARS-CoV-2 by FDA  under an Emergency Use Authorization (EUA). This EUA will remain  in effect (meaning this test can be used) for the duration of the COVID-19 declaration under Section 56 4(b)(1) of the Act, 21 U.S.C. section 360bbb-3(b)(1), unless the authorization is terminated or revoked sooner. Performed at [facility], [place] [address], [place], [place] [address]   MRSA PCR Screening     Status: None   Collection Time: 09/28/19  3:08 AM   Specimen: Nasal Mucosa; Nasopharyngeal  Result Value Ref Range Status   MRSA by PCR NEGATIVE NEGATIVE Final    Comment:        The GeneXpert MRSA Assay (FDA approved for NASAL specimens only), is one component of a comprehensive MRSA colonization surveillance program. It is not intended to diagnose MRSA infection nor to guide or monitor treatment for MRSA infections. Performed at [facility] [place], [place] [address]   SARS CORONAVIRUS 2 (TAT 6-24 HRS) Nasopharyngeal Nasopharyngeal Swab     Status: None   Collection Time: 10/01/19 12:28 PM   Specimen: Nasopharyngeal Swab  Result Value Ref Range Status   SARS Coronavirus 2 NEGATIVE NEGATIVE Final    Comment: (NOTE) SARS-CoV-2 target nucleic acids are NOT DETECTED. The SARS-CoV-2 RNA is generally detectable in upper and lower respiratory specimens during the acute phase of infection. Negative results do not preclude SARS-CoV-2 infection, do not rule out co-infections with other pathogens, and should not be used as the sole basis for treatment or other patient management decisions. Negative results must be combined with clinical observations, patient history, and epidemiological information. The expected result is Negative. Fact Sheet for Patients: SugarRoll.be Fact Sheet for Healthcare Providers: https://www.woods-mathews.com/ This test is not yet approved or cleared by the [place] FDA and  has been authorized for detection  and/or diagnosis of SARS-CoV-2 by FDA under an Emergency Use Authorization (EUA). This EUA will remain  in effect (meaning this test can be used) for the duration of the COVID-19 declaration under Section 56 4(b)(1) of the Act, 21 U.S.C. section 360bbb-3(b)(1), unless the authorization is terminated or revoked sooner. Performed at [facility], [place] [address], [place], [place] [address]     Coagulation Studies: No results for input(s): LABPROT, INR in the last 72 hours.  Urinalysis: No results for input(s): COLORURINE, LABSPEC, PHURINE, GLUCOSEU, HGBUR, BILIRUBINUR, KETONESUR, PROTEINUR, UROBILINOGEN, NITRITE, LEUKOCYTESUR in the last 72 hours.  Invalid input(s): APPERANCEUR    Imaging: CT ANGIO CHEST PE W OR WO CONTRAST  Result Date: 10/03/2019 CLINICAL DATA:  Respiratory failure. EXAM: CT ANGIOGRAPHY CHEST WITH CONTRAST TECHNIQUE: Multidetector CT imaging of the chest was performed using the standard protocol during bolus administration of intravenous contrast. Multiplanar CT image reconstructions and MIPs were obtained to evaluate the vascular anatomy. CONTRAST:  59m OMNIPAQUE IOHEXOL 350 MG/ML SOLN COMPARISON:  August 24, 2019. FINDINGS: Cardiovascular: Satisfactory opacification of the pulmonary arteries to the segmental level. No evidence of pulmonary embolism. Mild cardiomegaly. Atherosclerosis of thoracic aorta is noted without aneurysm or dissection. Patent left brachiocephalic vein stent is noted. No pericardial effusion. Mediastinum/Nodes: Small sliding-type hiatal hernia. No significant adenopathy is noted. Thyroid gland is unremarkable. Lungs/Pleura: Small left pleural effusion is noted. Mild bilateral posterior basilar subsegmental atelectasis is noted. Possible bilateral pulmonary edema is noted in both upper lobes. Upper Abdomen: No acute abnormality. Musculoskeletal: No chest wall abnormality. No acute or significant osseous findings. Review of the MIP images confirms  the above findings. IMPRESSION: 1. No definite evidence of pulmonary embolus. 2. Small left pleural effusion is noted with mild bilateral posterior basilar subsegmental atelectasis. 3. Possible bilateral pulmonary edema is noted in both upper lobes. 4. Small sliding-type hiatal hernia. Aortic Atherosclerosis (ICD10-I70.0). Electronically Signed   By: JMarijo ConceptionM.D.   On: 10/03/2019 12:48   UKoreaVenous Img Lower Bilateral (DVT)  Result Date: 10/03/2019 CLINICAL DATA:  Bilateral lower extremity pain. History of malignancy. Evaluate for DVT. EXAM: BILATERAL LOWER EXTREMITY VENOUS DOPPLER ULTRASOUND TECHNIQUE: Gray-scale sonography with graded compression, as well as color Doppler and duplex ultrasound were performed to evaluate the lower extremity deep venous systems from the level of the common femoral vein and including the common femoral, femoral, profunda femoral, popliteal and calf veins including the posterior tibial, peroneal and gastrocnemius veins when visible. The superficial great saphenous vein was also interrogated. Spectral Doppler was utilized to evaluate flow at rest and with distal augmentation maneuvers in the common femoral, femoral and popliteal veins. COMPARISON:  None. FINDINGS: RIGHT LOWER EXTREMITY Common Femoral Vein: No evidence of thrombus. Normal compressibility, respiratory phasicity and response to augmentation. Saphenofemoral Junction: No evidence of thrombus. Normal compressibility and flow on color Doppler imaging. Profunda Femoral Vein: No evidence of thrombus. Normal compressibility and flow on color Doppler imaging. Femoral Vein: No evidence of thrombus. Normal compressibility, respiratory phasicity and response to augmentation. Popliteal Vein: No evidence of thrombus. Normal compressibility, respiratory phasicity and response to augmentation. Calf Veins: No evidence of thrombus. Normal compressibility and flow on color Doppler imaging. Superficial Great Saphenous Vein: No  evidence of thrombus. Normal compressibility. Venous Reflux:  None. Other Findings:  None. LEFT LOWER EXTREMITY Common Femoral Vein: No evidence of thrombus. Normal compressibility, respiratory phasicity and response to augmentation. Saphenofemoral Junction: No evidence of thrombus. Normal compressibility and flow on color Doppler imaging. Profunda Femoral Vein: No evidence of thrombus. Normal compressibility and flow on color Doppler imaging. Femoral Vein: No evidence of thrombus. Normal compressibility, respiratory phasicity and response to augmentation. Popliteal Vein: No evidence of thrombus. Normal compressibility, respiratory phasicity and response to augmentation. Calf Veins: No evidence of thrombus. Normal compressibility and flow on color Doppler imaging. Superficial Great Saphenous Vein: No evidence of thrombus. Normal compressibility. Venous Reflux:  None. Other Findings:  None. IMPRESSION: No evidence of DVT within either lower extremity. Electronically Signed   By: JSandi MariscalM.D.   On: 10/03/2019 11:35     Medications:  cefTRIAXone (ROCEPHIN)  IV 1 g (10/04/19 1700)    amLODipine  5 mg Per Tube Daily   B-complex with vitamin C  1 tablet Per Tube QHS   budesonide (PULMICORT) nebulizer solution  0.5 mg Nebulization BID   Chlorhexidine Gluconate Cloth  6 each Topical Daily   epoetin (EPOGEN/PROCRIT) injection  10,000 Units Intravenous Q M,W,F-HD   feeding supplement (NEPRO CARB STEADY)  1,000 mL Per Tube Q24H   feeding supplement (PRO-STAT SUGAR FREE 64)  30 mL Per Tube BID   free water  50 mL Per Tube 6 X Daily   gabapentin  100 mg Per Tube Daily   insulin aspart  0-6 Units Subcutaneous Q4H   ipratropium  0.5 mg Nebulization Q6H   lacosamide  100 mg Per Tube BID   levalbuterol  0.63 mg Nebulization Q6H   mouth rinse  15 mL Mouth Rinse BID   methylPREDNISolone (SOLU-MEDROL) injection  60 mg Intravenous Q6H   pantoprazole sodium  40 mg Per Tube BID   acetaminophen (TYLENOL) oral  liquid 160 mg/5 mL, albuterol, diclofenac Sodium  Assessment/ Plan:  Barbara [name] is a 79 y.o. black female with end stage renal disease on hemodialysis, hypertension, diabetes mellitus type II, diabetic neuropathy, seizure disorder, left hip repair, anemia of chronic kidney disease, secondary hyperparathyroidism.  [facility] MWF [facility] LUE AVG 79.5kg    1.  ESRD on HD MWF.  - patient reported she tolerated it well.    2. Anemia of chronic kidney disease.  With GI bleed. Hemoglobin 7.9 Requiring blood transfusion on admission.  - Continue EPO with hemodialysis  - pantoprazole.    3.  Secondary hyperparathyroidism. Calcium and phosphorus at goal. Outpatient PTH was low at 72. Holding binders and activated vitamin D.    4. Hypertension: 152/71 - amlodipine.    LOS: [place] 3/18/202110:33 AM  Patient seen and examined with physician assistant. Agree with above plan.   Lavonia Dana, [facility]  3/18/20214:30 PM

## 2019-10-05 NOTE — Progress Notes (Signed)
Physical Therapy Treatment Patient Details Name: Barbara [name] MRN: [id number] DOB: Jun 06, 1941 Today's Date: 10/05/2019    History of Present Illness 79 yo female with ESRD on HD admitted with acute GI bleed s/p upper endoscopy requiring transfer to the stepdown unit with worsening acute on chronic hypoxic respiratory failure secondary to acute on chronic CHF exacerbation and AECOPD requiring HFNC (transfered to CCU 3/15).  She is 1 month s/p L total hip (posterior approach)    PT Comments    Pt agrees to session.  On first attempt tube feeding leaking and RN in to address.  Returned and she is able to transition to sitting with max a x 1.  Once sitting, he is able to sit unsupported but requires close supervision for safety.  Attempted to stand x 1 with RW but unable with +1 assist.  +2 attempted x 2 but she is unable to stand fully with walker.  Returned to supine with max a x 2 dependant and dependant repositioning in bed.  Pt seemed generally upset today at times and seemed like she would/could cry.  Emotional support given.  She was returned to bed per her request and tried to call her husband as she asked  but line was busy.  Pt requries +2 assist max a transfer to recliner or sit to stand lift is appropriate if assist is not available.     Follow Up Recommendations  SNF     Equipment Recommendations  Rolling walker with 5" wheels;3in1 (PT);Wheelchair cushion (measurements PT);Wheelchair (measurements PT)    Recommendations for Other Services       Precautions / Restrictions Precautions Precautions: Posterior Hip;Fall Restrictions LLE Weight Bearing: Weight bearing as tolerated    Mobility  Bed Mobility Overal bed mobility: Needs Assistance Bed Mobility: Supine to Sit;Sit to Supine Rolling: Max assist   Supine to sit: Max assist;+2 for physical assistance     General bed mobility comments: Pt able to show some effort in getting to EOB, but ultimately quite  limited  Transfers Overall transfer level: Needs assistance Equipment used: Rolling walker (2 wheeled);None Transfers: Sit to/from Stand Sit to Stand: Max assist;+2 physical assistance         General transfer comment: attemtped standing but did not stand fully on 3 attempts  Ambulation/Gait             General Gait Details: unsafe/unable   Stairs             Wheelchair Mobility    Modified Rankin (Stroke Patients Only)       Balance Overall balance assessment: Needs assistance   Sitting balance-Leahy Scale: Poor Sitting balance - Comments: able to sit unsupported but requires close supervision for safety.   Standing balance support: Bilateral upper extremity supported Standing balance-Leahy Scale: Zero Standing balance comment: unable to stand without support                            Cognition Arousal/Alertness: Awake/alert Behavior During Therapy: Flat affect Overall Cognitive Status: No family/caregiver present to determine baseline cognitive functioning                                 General Comments: able to follow simple commands, seems somewhat labile today.      Exercises Other Exercises Other Exercises: particiapted in some AA/PROM in supine and sitting bor BLE but puts little  effort into assisting.    General Comments        Pertinent Vitals/Pain      Home Living                      Prior Function            PT Goals (current goals can now be found in the care plan section) Progress towards PT goals: Progressing toward goals    Frequency    Min 2X/week      PT Plan Current plan remains appropriate    Co-evaluation              AM-PAC PT "6 Clicks" Mobility   Outcome Measure  Help needed turning from your back to your side while in a flat bed without using bedrails?: Total Help needed moving from lying on your back to sitting on the side of a flat bed without using  bedrails?: Total Help needed moving to and from a bed to a chair (including a wheelchair)?: Total Help needed standing up from a chair using your arms (e.g., wheelchair or bedside chair)?: Total Help needed to walk in hospital room?: Total Help needed climbing 3-5 steps with a railing? : Total 6 Click Score: 6    End of Session Equipment Utilized During Treatment: Oxygen;Gait belt Activity Tolerance: Patient limited by fatigue;Other (comment) Patient left: in bed;with call bell/phone within reach;with bed alarm set   Pain - Right/Left: Left Pain - part of body: Hip     Time: 0929-5747 PT Time Calculation (min) (ACUTE ONLY): 25 min  Charges:  $Therapeutic Exercise: 8-22 mins $Therapeutic Activity: 8-22 mins                     Chesley Noon, PTA 10/05/19, 11:02 AM

## 2019-10-06 LAB — CBC
HCT: 28.7 % — ABNORMAL LOW (ref 36.0–46.0)
HCT: 29.9 % — ABNORMAL LOW (ref 36.0–46.0)
Hemoglobin: 9 g/dL — ABNORMAL LOW (ref 12.0–15.0)
Hemoglobin: 9.3 g/dL — ABNORMAL LOW (ref 12.0–15.0)
MCH: 29 pg (ref 26.0–34.0)
MCH: 29.1 pg (ref 26.0–34.0)
MCHC: 31.4 g/dL (ref 30.0–36.0)
MCHC: 31.1 g/dL (ref 30.0–36.0)
MCV: 92.6 fL (ref 80.0–100.0)
MCV: 93.4 fL (ref 80.0–100.0)
Platelets: 379 10*3/uL (ref 150–400)
Platelets: 379 10*3/uL (ref 150–400)
RBC: 3.1 MIL/uL — ABNORMAL LOW (ref 3.87–5.11)
RBC: 3.2 MIL/uL — ABNORMAL LOW (ref 3.87–5.11)
RDW: 16.4 % — ABNORMAL HIGH (ref 11.5–15.5)
RDW: 16.4 % — ABNORMAL HIGH (ref 11.5–15.5)
WBC: 13.2 10*3/uL — ABNORMAL HIGH (ref 4.0–10.5)
WBC: 15.3 10*3/uL — ABNORMAL HIGH (ref 4.0–10.5)
nRBC: 1.2 % — ABNORMAL HIGH (ref 0.0–0.2)
nRBC: 2 % — ABNORMAL HIGH (ref 0.0–0.2)

## 2019-10-06 LAB — RENAL FUNCTION PANEL
Albumin: 3.2 g/dL — ABNORMAL LOW (ref 3.5–5.0)
Anion gap: 18 — ABNORMAL HIGH (ref 5–15)
BUN: 108 mg/dL — ABNORMAL HIGH (ref 8–23)
CO2: 26 mmol/L (ref 22–32)
Calcium: 9 mg/dL (ref 8.9–10.3)
Chloride: 96 mmol/L — ABNORMAL LOW (ref 98–111)
Creatinine, Ser: 6.92 mg/dL — ABNORMAL HIGH (ref 0.44–1.00)
GFR calc Af Amer: 6 mL/min — ABNORMAL LOW (ref >60)
GFR calc non Af Amer: 5 mL/min — ABNORMAL LOW (ref >60)
Glucose, Bld: 175 mg/dL — ABNORMAL HIGH (ref 70–99)
Phosphorus: 4.1 mg/dL (ref 2.5–4.6)
Potassium: 3.6 mmol/L (ref 3.5–5.1)
Sodium: 140 mmol/L (ref 135–145)

## 2019-10-06 LAB — SARS CORONAVIRUS 2 (TAT 6-24 HRS): SARS Coronavirus 2: NEGATIVE

## 2019-10-06 LAB — GLUCOSE, CAPILLARY
Glucose-Capillary: 155 mg/dL — ABNORMAL HIGH (ref 70–99)
Glucose-Capillary: 159 mg/dL — ABNORMAL HIGH (ref 70–99)
Glucose-Capillary: 172 mg/dL — ABNORMAL HIGH (ref 70–99)
Glucose-Capillary: 194 mg/dL — ABNORMAL HIGH (ref 70–99)
Glucose-Capillary: 202 mg/dL — ABNORMAL HIGH (ref 70–99)
Glucose-Capillary: 211 mg/dL — ABNORMAL HIGH (ref 70–99)

## 2019-10-06 MED ORDER — METHYLPREDNISOLONE 4 MG PO TBPK
4.0000 mg | ORAL_TABLET | ORAL | Status: DC
  Filled 2019-10-06: qty 21

## 2019-10-06 MED ORDER — MOMETASONE FURO-FORMOTEROL FUM 100-5 MCG/ACT IN AERO
2.0000 | INHALATION_SPRAY | Freq: Two times a day (BID) | RESPIRATORY_TRACT | Status: DC
  Administered 2019-10-06 – 2019-10-10 (×7): 2 via RESPIRATORY_TRACT
  Filled 2019-10-06: qty 8.8

## 2019-10-06 MED ORDER — METHYLPREDNISOLONE 4 MG PO TBPK: 4.0000 mg | ORAL_TABLET | ORAL | Status: DC

## 2019-10-06 MED ORDER — IPRATROPIUM BROMIDE HFA 17 MCG/ACT IN AERS
2.0000 | INHALATION_SPRAY | Freq: Four times a day (QID) | RESPIRATORY_TRACT | Status: DC
  Administered 2019-10-06 – 2019-10-10 (×10): 2 via RESPIRATORY_TRACT
  Filled 2019-10-06: qty 12.9

## 2019-10-06 MED ORDER — METHYLPREDNISOLONE 4 MG PO TBPK
8.0000 mg | ORAL_TABLET | Freq: Every morning | ORAL | Status: DC
  Filled 2019-10-06: qty 21

## 2019-10-06 MED ORDER — METHYLPREDNISOLONE 4 MG PO TBPK: 4.0000 mg | ORAL_TABLET | Freq: Three times a day (TID) | ORAL | Status: DC

## 2019-10-06 MED ORDER — METHYLPREDNISOLONE 4 MG PO TBPK: 8.0000 mg | ORAL_TABLET | Freq: Every evening | ORAL | Status: DC

## 2019-10-06 MED ORDER — SODIUM CHLORIDE 0.9 % IV SOLN
200.0000 mg | Freq: Once | INTRAVENOUS | Status: DC
  Filled 2019-10-06: qty 40

## 2019-10-06 MED ORDER — METHYLPREDNISOLONE 4 MG PO TBPK: 8.0000 mg | ORAL_TABLET | Freq: Every evening | ORAL | Status: AC

## 2019-10-06 MED ORDER — METHYLPREDNISOLONE 4 MG PO TBPK: 4.0000 mg | ORAL_TABLET | Freq: Four times a day (QID) | ORAL | Status: DC

## 2019-10-06 MED ORDER — METHYLPREDNISOLONE 4 MG PO TBPK
4.0000 mg | ORAL_TABLET | Freq: Four times a day (QID) | ORAL | Status: DC
  Administered 2019-10-08 – 2019-10-10 (×7): 4 mg

## 2019-10-06 MED ORDER — METHYLPREDNISOLONE 4 MG PO TBPK
8.0000 mg | ORAL_TABLET | Freq: Every evening | ORAL | Status: AC
  Administered 2019-10-07: 23:00:00 8 mg

## 2019-10-06 MED ORDER — METHYLPREDNISOLONE 4 MG PO TBPK
4.0000 mg | ORAL_TABLET | ORAL | Status: AC
  Administered 2019-10-06: 4 mg
  Filled 2019-10-06: qty 21

## 2019-10-06 MED ORDER — SODIUM CHLORIDE 0.9 % IV SOLN
100.0000 mg | Freq: Every day | INTRAVENOUS | Status: DC
  Filled 2019-10-06: qty 20

## 2019-10-06 MED ORDER — METHYLPREDNISOLONE 4 MG PO TBPK: 4.0000 mg | ORAL_TABLET | ORAL | Status: AC

## 2019-10-06 MED ORDER — ALBUTEROL SULFATE HFA 108 (90 BASE) MCG/ACT IN AERS
2.0000 | INHALATION_SPRAY | Freq: Four times a day (QID) | RESPIRATORY_TRACT | Status: DC | PRN
  Administered 2019-10-09: 22:00:00 2 via RESPIRATORY_TRACT
  Filled 2019-10-06: qty 6.7

## 2019-10-06 MED ORDER — METHYLPREDNISOLONE 4 MG PO TBPK
4.0000 mg | ORAL_TABLET | Freq: Three times a day (TID) | ORAL | Status: AC
  Administered 2019-10-07 (×3): 4 mg

## 2019-10-06 NOTE — Progress Notes (Signed)
PROGRESS NOTE    Barbara [name]  NOM:[id number] DOB: Nov 28, 1940 DOA: 09/27/2019 PCP: [name], MD   Brief Narrative: Barbara Stapp Conyersis a 79 y.o.femalewith medical history significant forESRD on HD MWF, hyperlipidemia, type 2 diabetes, COPD, asthma, GERD, bladder cancer, and iron deficiency anemia who presents for concerns of acute GI bleed and anemia. SP EGD and replacement of the G-tube. Pt came with anemia and hg of 4.8  And received 2 untis of PRBC in ed. She was also recently  hospitalized from 02/16 to 03/7 following a left total hip arthroplasty  (96/28/3662) complicated by toxic metabolic encephalopathy due to severe metabolic acidosis, and seizure activity (started on vimpat) requiring mechanical intubation/CRRTand PEG tube placement due to dysphagia then discharged to [facility].   She presented to [facility] ER on 03/10 via EMS from [facility] with coffee-ground emesis onset 2 days prior to presentation.  Lab results at the facility revealed hgb of 5 prompting ER visit.  It was also reported the pt pulled out her PEG tube.  In the ER lab results revealed Na+ 133, glucose 121, BUN 39, creatinine 4.37, albumin 2.5, lipase 63, creatinine 4.37, wbc 19.7, hgb 4.8 and COVID-19 negative.  ER physician also reported the pt had a grossly melanotic stool. Pt received 2 unit of pRBC's, protonix gtt started, and GI consulted.   She has egd on 09/28/19 which showed non-bleeding gastric ulcer.  Then patient developed respiratory failure with bilateral wheezes currently being transferred to stepdown unit. I suspect respiratory failure from volume overload from transfusion.  Pt transferred to floor on 3/17 and has been stable and Currently further plan is continue treat respiratory failure and monitor for stability transferring [facility]. Pt is table and needs authorization for discharge to snf, spouse st bedside today and states daughter and himself cant take care of her. Per  requirement pt needs covid-19 pcr pending for d/c in am to a SNF.   Assessment & Plan:   Principal Problem:   Acute GI bleeding Active Problems:   Leukocytosis   ESRD (end stage renal disease) (HCC)   Type II diabetes mellitus with renal manifestations (HCC)   Sacral ulcer (HCC)   Acute respiratory failure with hypoxia (HCC)   Chronic diastolic CHF (congestive heart failure) (HCC)   Seizure (HCC)   PEG tube malfunction (HCC)   Acute GI bleed Hemoglobin of 8.1 and transfuse during dialysis. Cont iv ppi. Attribute to combination of GI bleed and iron def anemia and ACD due to her CKD,.   Acute blood loss anemia. Anemia of chronic kidney disease Presented with GI bleed.  Hemoglobin dropped down to 8.1  pt on epo and transfuse as needed.  PEG tubedependency with dysphagia Placed on 09/20/2019 due to dysphagia Appreciate GI assistance for replacement of the G-tube. GI recommended to resume diet. Currently tolerating well.  Acute hypoxic respiratory failure/Acute on chronic systolic CHF/Acute bronchitis: Resolved . Pt is on Room Air with normal oxygen saturations.   Leukocytosis/concern for cellulitis This appears to have been chronic for the past week. No signs of fever.  Continue IV antibiotics  ESRD on HD MWF Creatinine is stable Need to monitor for any fluid overload with blood transfusion Appreciate nephrology assistance. Patient will require to sit in the chair.  Chronic diastolic CHF:  Hemodialysis for  Volume management  Type 2 diabetes controlled. diet-controlled  Currently on sliding scale insulin. Also on Nepro for tube feeding. accuchecks ac/hs  Hx of seizure Continue  home regimen per tube.  Stage 1 sacral and buttock ulcers  Wound care per RN  Dysphagia. Patient is significantly awake and alert. Speech therapy consulted for assessment for the diet. Per speech therapy patient was not eating enough likely from not being hungry while she  was on tube feeding. Given lethargy currently holding off on further work-up.  Appreciate speech therapy recommendation.  Acute encephalopathy.  Likely toxic. From medication.  Received fentanyl early this morning. Continues to report severe right leg cramps.  And given Robaxin. Continue to monitor for now.  Covid-19: Pt was found to be covid-19 positive and started on remdesivir, however later today received report from nurse that  Results are erroneous and due to lack of iv access also iv t/t d/c and changed to po needed meds only.   DVT prophylaxis: SCD's Code Status: Full Code Family Communication: Spouse at bedside [facility]position Plan: [facility], Insurance authorization for d/c to [facility] in am and covid-19 pcr test.   Consultants:   Gertie Fey  nephro   Procedures: Dialysis M/W/F   Subjective: Pt seen today for f/u and is stable spouse wants her to go to snf.  Pt denies any complaints of chest pain or sob.   Objective: Vitals:   10/06/19 0900 10/06/19 0915 10/06/19 1026 10/06/19 1626  BP: (!) 179/57   (!) 177/60  Pulse: 85 83 80 80  Resp:  15  19  Temp:    98.2 F (36.8 C)  TempSrc:    Oral  SpO2: 96% 94% 98% 93%  Weight:      Height:       No intake or output data in the 24 hours ending 10/06/19 1829 Filed Weights   10/03/19 0425 10/05/19 0600 10/06/19 0649  Weight: 78.4 kg 76.2 kg 77.3 kg    Examination: Blood pressure (!) 177/60, pulse 80, temperature 98.2 F (36.8 C), temperature source Oral, resp. rate 19, height 5' 6"  (1.676 m), weight 77.3 kg, SpO2 93 %. General exam: Appears calm and comfortable  Respiratory system: Clear to auscultation. Respiratory effort normal. Cardiovascular system: S1 & S2 heard, RRR. No JVD, murmurs, rubs, gallops or clicks. No pedal edema. Gastrointestinal system: Abdomen is nondistended, soft and nontender. No organomegaly or masses felt. Normal bowel sounds heard. Central nervous system: Alert and oriented. No focal  neurological deficits. Extremities: Symmetric 5 x 5 power. Skin: No rashes, lesions or ulcers Psychiatry: Judgement and insight appear normal. Mood & affect appropriate.   Data Reviewed:  CBC: Recent Labs  Lab 10/03/19 0423 10/04/19 0805 10/05/19 1455 10/06/19 0613 10/06/19 1332  WBC 8.6 11.3* 13.1* 13.2* 15.3*  HGB 8.3* 7.9* 8.1* 9.0* 9.3*  HCT 26.7* 26.1* 26.0* 28.7* 29.9*  MCV 92.7 96.0 92.2 92.6 93.4  PLT 255 307 320 379 700   Basic Metabolic Panel: Recent Labs  Lab 10/01/19 0903 10/02/19 0500 10/02/19 0930 10/03/19 0423 10/04/19 0805 10/05/19 1455 10/06/19 0613  NA   < >  --  136 141 139 140 140  K   < >  --  3.7 4.0 3.9 3.5 3.6  CL   < >  --  97* 98 95* 96* 96*  CO2   < >  --  27 31 27 27 26   GLUCOSE   < >  --  168* 174* 142* 153* 175*  BUN   < >  --  71* 48* 91* 81* 108*  CREATININE   < >  --  8.21* 5.47* 7.02* 5.68* 6.92*  CALCIUM   < >  --  8.5* 8.8* 9.0 9.0 9.0  MG  --  2.0  --  2.0  --   --   --   PHOS  --   --  3.2 3.9 3.3 3.2 4.1   < > = values in this interval not displayed.   GFR: Estimated Creatinine Clearance: 6.9 mL/min (A) (by C-G formula based on SCr of 6.92 mg/dL (H)). Liver Function Tests: Recent Labs  Lab 10/02/19 0930 10/03/19 0423 10/04/19 0805 10/05/19 1455 10/06/19 0613  ALBUMIN 2.3* 2.4* 2.7* 3.0* 3.2*   No results for input(s): LIPASE, AMYLASE in the last 168 hours. No results for input(s): AMMONIA in the last 168 hours. Coagulation Profile: No results for input(s): INR, PROTIME in the last 168 hours. Cardiac Enzymes: No results for input(s): CKTOTAL, CKMB, CKMBINDEX, TROPONINI in the last 168 hours. BNP (last 3 results) No results for input(s): PROBNP in the last 8760 hours. HbA1C: No results for input(s): HGBA1C in the last 72 hours. CBG: Recent Labs  Lab 10/06/19 0031 10/06/19 0347 10/06/19 0803 10/06/19 1149 10/06/19 1622  GLUCAP 172* 159* 202* 194* 155*    Recent Results (from the past 240 hour(s))  SARS  CORONAVIRUS 2 (TAT 6-24 HRS) Nasopharyngeal Nasopharyngeal Swab     Status: None   Collection Time: 09/28/19  1:40 AM   Specimen: Nasopharyngeal Swab  Result Value Ref Range Status   SARS Coronavirus 2 NEGATIVE NEGATIVE Final    Comment: (NOTE) SARS-CoV-2 target nucleic acids are NOT DETECTED. The SARS-CoV-2 RNA is generally detectable in upper and lower respiratory specimens during the acute phase of infection. Negative results do not preclude SARS-CoV-2 infection, do not rule out co-infections with other pathogens, and should not be used as the sole basis for treatment or other patient management decisions. Negative results must be combined with clinical observations, patient history, and epidemiological information. The expected result is Negative. Fact Sheet for Patients: SugarRoll.be Fact Sheet for Healthcare Providers: https://www.woods-mathews.com/ This test is not yet approved or cleared by the [place] FDA and  has been authorized for detection and/or diagnosis of SARS-CoV-2 by FDA under an Emergency Use Authorization (EUA). This EUA will remain  in effect (meaning this test can be used) for the duration of the COVID-19 declaration under Section 56 4(b)(1) of the Act, 21 U.S.C. section 360bbb-3(b)(1), unless the authorization is terminated or revoked sooner. Performed at [facility], [place] [address] [place], [place] [address]   MRSA PCR Screening     Status: None   Collection Time: 09/28/19  3:08 AM   Specimen: Nasal Mucosa; Nasopharyngeal  Result Value Ref Range Status   MRSA by PCR NEGATIVE NEGATIVE Final    Comment:        The GeneXpert MRSA Assay (FDA approved for NASAL specimens only), is one component of a comprehensive MRSA colonization surveillance program. It is not intended to diagnose MRSA infection nor to guide or monitor treatment for MRSA infections. Performed at [facility], [place], [place] [address]   SARS CORONAVIRUS 2 (TAT 6-24 HRS) Nasopharyngeal Nasopharyngeal Swab     Status: None   Collection Time: 10/01/19 12:28 PM   Specimen: Nasopharyngeal Swab  Result Value Ref Range Status   SARS Coronavirus 2 NEGATIVE NEGATIVE Final    Comment: (NOTE) SARS-CoV-2 target nucleic acids are NOT DETECTED. The SARS-CoV-2 RNA is generally detectable in upper and lower respiratory specimens during the acute phase of infection. Negative results  do not preclude SARS-CoV-2 infection, do not rule out co-infections with other pathogens, and should not be used as the sole basis for treatment or other patient management decisions. Negative results must be combined with clinical observations, patient history, and epidemiological information. The expected result is Negative. Fact Sheet for Patients: SugarRoll.be Fact Sheet for Healthcare Providers: https://www.woods-mathews.com/ This test is not yet approved or cleared by the [place] FDA and  has been authorized for detection and/or diagnosis of SARS-CoV-2 by FDA under an Emergency Use Authorization (EUA). This EUA will remain  in effect (meaning this test can be used) for the duration of the COVID-19 declaration under Section 56 4(b)(1) of the Act, 21 U.S.C. section 360bbb-3(b)(1), unless the authorization is terminated or revoked sooner. Performed at [facility], [place] [address], [place], [place] [address]   SARS CORONAVIRUS 2 (TAT 6-24 HRS) Nasopharyngeal Nasopharyngeal Swab     Status: None   Collection Time: 10/05/19  3:11 PM   Specimen: Nasopharyngeal Swab  Result Value Ref Range Status   SARS Coronavirus 2 NEGATIVE NEGATIVE Corrected    Comment: RESULT CALLED TO, READ BACK BY AND VERIFIED WITH: THIS IS A CORRECTED REPORT. PREVIOUSLY CALLED REPORT WAS POSITIVE. CALLED TO [facility] RN 10/06/19 1755 JDW Performed at [place] [address]  [place], [place] [address] CORRECTED ON 03/19 AT 6553: PREVIOUSLY REPORTED AS POSITIVE RESULT CALLED TO, READ BACK BY AND VERIFIED WITH: RN  TINA MICHAEL AT [facility] ON 10/06/2019       Radiology Studies: No results found.  Scheduled Meds: . B-complex with vitamin C  1 tablet Per Tube QHS  . Chlorhexidine Gluconate Cloth  6 each Topical Daily  . epoetin (EPOGEN/PROCRIT) injection  10,000 Units Intravenous Q M,W,F-HD  . feeding supplement (NEPRO CARB STEADY)  1,000 mL Per Tube Q24H  . feeding supplement (PRO-STAT SUGAR FREE 64)  30 mL Per Tube BID  . free water  50 mL Per Tube 6 X Daily  . gabapentin  100 mg Per Tube Daily  . insulin aspart  0-6 Units Subcutaneous Q4H  . ipratropium  2 puff Inhalation Q6H  . lacosamide  100 mg Per Tube BID  . mouth rinse  15 mL Mouth Rinse BID  . methylPREDNISolone  4 mg Oral PC lunch  . methylPREDNISolone  4 mg Oral PC supper  . [START ON 10/07/2019] methylPREDNISolone  4 mg Oral 3 x daily with food  . [START ON 10/08/2019] methylPREDNISolone  4 mg Oral 4X daily taper  . methylPREDNISolone  8 mg Oral AC breakfast  . methylPREDNISolone  8 mg Oral Nightly  . [START ON 10/07/2019] methylPREDNISolone  8 mg Oral Nightly  . mometasone-formoterol  2 puff Inhalation BID  . pantoprazole sodium  40 mg Per Tube BID     LOS: 8 days    Para Skeans, DO [facility] If 7PM-7AM, please contact night-coverage [email or web address] Password [facility] 10/06/2019, 6:29 PM

## 2019-10-06 NOTE — TOC Progression Note (Addendum)
Transition of Care [facility]) - Progression Note    Patient Details  Name: Barbara [name] MRN: [id number] Date of Birth: [birth year]  Transition of Care [facility]) CM/SW Contact  Aqib Lough, Gardiner Rhyme, LCSW Phone Number: 10/06/2019, 10:01 AM  Clinical Narrative:  With positive COVID test this am will not be able to return to [facility]. Will need to sent out FL2 and find a COVID bed for pt. She has been to [facility] in the past when she had COVID in 03/2019.    11:26 am MD reports pt having symptoms now and has started remedesiver for next five days  Expected Discharge Plan: [place] Barriers to Discharge: Insurance Authorization  Expected Discharge Plan and Services Expected Discharge Plan: [place] arrangements for the past 2 months: [place]                                       Social Determinants of Health (SDOH) Interventions    Readmission Risk Interventions Readmission Risk Prevention Plan 09/29/2019  Transportation Screening Complete  Medication Review Press photographer) Complete  Palliative Care Screening Not Applicable  Some recent data might be hidden

## 2019-10-06 NOTE — Progress Notes (Signed)
Bipap not initiated due to pending Covid test results

## 2019-10-06 NOTE — Progress Notes (Addendum)
Moved medications to administer after dialysis per HD protocol. Remdesivir to be provided after dialysis with other medications. Awaiting MD confirmation on dialysis timing as patient may change OP dialysis days to T/TH.SA during COVID  ISO period. MD may change in hospital dialysis dates/ times.Marland Kitchen

## 2019-10-06 NOTE — Progress Notes (Signed)
Pt positive covid and report given to RN on 1C. Pt taken to room 126

## 2019-10-06 NOTE — Progress Notes (Signed)
Received call from microbiology who states that patient is NOT COVID positive. That she cannot specify the error but that she is calling to notify staff. Noted COVID negative result in labs. Documentation has been changes as of 10/06/19 at 1755

## 2019-10-06 NOTE — Progress Notes (Signed)
IV team unable to get IV access after 2 PIV attempts with ultrasound. Recommend a central line as she is not a PICC candidate with active HD. It appears she had a central line last admission as well. RN made aware.

## 2019-10-06 NOTE — TOC Progression Note (Signed)
Transition of Care [facility]) - Progression Note    Patient Details  Name: Barbara [name] MRN: [id number] Date of Birth: Sep 25, 1940  [facility]) CM/SW [place], RN Phone Number: 10/06/2019, 1:04 PM  Clinical Narrative:     [place] and requested to withdrawal the auth request, the patient will need to be medically stable , We will restart the auth when the patient gets closer to [place]  Expected Discharge Plan: [place] Barriers to Discharge: Insurance Authorization  Expected Discharge Plan and Services Expected Discharge Plan: [place]       Living arrangements for the past 2 months: [place] Determinants of Health (SDOH) Interventions    Readmission Risk Interventions Readmission Risk Prevention Plan 09/29/2019  Transportation Screening Complete  Medication Review (RN Care Manager) Complete  Palliative Care Screening Not Applicable  Some recent data might be hidden

## 2019-10-06 NOTE — Progress Notes (Signed)
[facility]  ROUNDING NOTE   Subjective:   COVID-19 positive.   Objective:  Vital signs in last 24 hours:  Temp:  [97.9 F (36.6 C)-98.2 F (36.8 C)] 97.9 F (36.6 C) (03/19 0757) Pulse Rate:  [77-85] 80 (03/19 1026) Resp:  [15-20] 15 (03/19 0915) BP: (156-194)/(57-85) 179/57 (03/19 0900) SpO2:  [94 %-100 %] 98 % (03/19 1026) Weight:  [77.3 kg] 77.3 kg (03/19 0649)  Weight change: 1.141 kg Filed Weights   10/03/19 0425 10/05/19 0600 10/06/19 0649  Weight: 78.4 kg 76.2 kg 77.3 kg    Intake/Output: No intake/output data recorded.   Intake/Output this shift:  No intake/output data recorded.  Physical Exam: General: NAD, patient resting in bed   Head: Normocephalic, atraumatic. Moist oral mucosal membranes  Eyes: Anicteric, PERRL  Neck: Supple, trachea midline  Lungs:  Clear to auscultation, On supplemental O2    Heart: Regular rate and rhythm  Abdomen:  Soft, nontender,   Extremities:  No peripheral edema.  Neurologic: Nonfocal, moving all four extremities  Skin: No lesions  Access: Left arm AVG    Basic Metabolic Panel: Recent Labs  Lab 10/01/19 0903 10/02/19 0500 10/02/19 0930 10/02/19 0930 10/03/19 0423 10/03/19 0423 10/04/19 0805 10/05/19 1455 10/06/19 0613  NA   < >  --  136  --  141  --  139 140 140  K   < >  --  3.7  --  4.0  --  3.9 3.5 3.6  CL   < >  --  97*  --  98  --  95* 96* 96*  CO2   < >  --  27  --  31  --  27 27 26   GLUCOSE   < >  --  168*  --  174*  --  142* 153* 175*  BUN   < >  --  71*  --  48*  --  91* 81* 108*  CREATININE   < >  --  8.21*  --  5.47*  --  7.02* 5.68* 6.92*  CALCIUM   < >  --  8.5*   < > 8.8*   < > 9.0 9.0 9.0  MG  --  2.0  --   --  2.0  --   --   --   --   PHOS  --   --  3.2  --  3.9  --  3.3 3.2 4.1   < > = values in this interval not displayed.    Liver Function Tests: Recent Labs  Lab 10/02/19 0930 10/03/19 0423 10/04/19 0805 10/05/19 1455 10/06/19 0613  ALBUMIN 2.3* 2.4* 2.7* 3.0* 3.2*    No results for input(s): LIPASE, AMYLASE in the last 168 hours. No results for input(s): AMMONIA in the last 168 hours.  CBC: Recent Labs  Lab 10/02/19 0500 10/03/19 0423 10/04/19 0805 10/05/19 1455 10/06/19 0613  WBC 16.8* 8.6 11.3* 13.1* 13.2*  HGB 8.6* 8.3* 7.9* 8.1* 9.0*  HCT 27.3* 26.7* 26.1* 26.0* 28.7*  MCV 92.2 92.7 96.0 92.2 92.6  PLT 315 255 307 320 379    Cardiac Enzymes: No results for input(s): CKTOTAL, CKMB, CKMBINDEX, TROPONINI in the last 168 hours.  BNP: Invalid input(s): POCBNP  CBG: Recent Labs  Lab 10/05/19 2046 10/06/19 0031 10/06/19 0347 10/06/19 0803 10/06/19 1149  GLUCAP 146* 172* 159* 202* 194*    Microbiology: Results for orders placed or performed during the hospital encounter of 09/27/19  SARS CORONAVIRUS 2 (  TAT 6-24 HRS) Nasopharyngeal Nasopharyngeal Swab     Status: None   Collection Time: 09/28/19  1:40 AM   Specimen: Nasopharyngeal Swab  Result Value Ref Range Status   SARS Coronavirus 2 NEGATIVE NEGATIVE Final    Comment: (NOTE) SARS-CoV-2 target nucleic acids are NOT DETECTED. The SARS-CoV-2 RNA is generally detectable in upper and lower respiratory specimens during the acute phase of infection. Negative results do not preclude SARS-CoV-2 infection, do not rule out co-infections with other pathogens, and should not be used as the sole basis for treatment or other patient management decisions. Negative results must be combined with clinical observations, patient history, and epidemiological information. The expected result is Negative. Fact Sheet for Patients: SugarRoll.be Fact Sheet for Healthcare Providers: https://www.woods-mathews.com/ This test is not yet approved or cleared by the [place] FDA and  has been authorized for detection and/or diagnosis of SARS-CoV-2 by FDA under an Emergency Use Authorization (EUA). This EUA will remain  in effect (meaning this test can be  used) for the duration of the COVID-19 declaration under Section 56 4(b)(1) of the Act, 21 U.S.C. section 360bbb-3(b)(1), unless the authorization is terminated or revoked sooner. Performed at [facility], [place] [address], [place], [place] [address]   MRSA PCR Screening     Status: None   Collection Time: 09/28/19  3:08 AM   Specimen: Nasal Mucosa; Nasopharyngeal  Result Value Ref Range Status   MRSA by PCR NEGATIVE NEGATIVE Final    Comment:        The GeneXpert MRSA Assay (FDA approved for NASAL specimens only), is one component of a comprehensive MRSA colonization surveillance program. It is not intended to diagnose MRSA infection nor to guide or monitor treatment for MRSA infections. Performed at [facility], [place], [place] [address]   SARS CORONAVIRUS 2 (TAT 6-24 HRS) Nasopharyngeal Nasopharyngeal Swab     Status: None   Collection Time: 10/01/19 12:28 PM   Specimen: Nasopharyngeal Swab  Result Value Ref Range Status   SARS Coronavirus 2 NEGATIVE NEGATIVE Final    Comment: (NOTE) SARS-CoV-2 target nucleic acids are NOT DETECTED. The SARS-CoV-2 RNA is generally detectable in upper and lower respiratory specimens during the acute phase of infection. Negative results do not preclude SARS-CoV-2 infection, do not rule out co-infections with other pathogens, and should not be used as the sole basis for treatment or other patient management decisions. Negative results must be combined with clinical observations, patient history, and epidemiological information. The expected result is Negative. Fact Sheet for Patients: SugarRoll.be Fact Sheet for Healthcare Providers: https://www.woods-mathews.com/ This test is not yet approved or cleared by the [place] FDA and  has been authorized for detection and/or diagnosis of SARS-CoV-2 by FDA under an Emergency Use Authorization (EUA). This EUA will  remain  in effect (meaning this test can be used) for the duration of the COVID-19 declaration under Section 56 4(b)(1) of the Act, 21 U.S.C. section 360bbb-3(b)(1), unless the authorization is terminated or revoked sooner. Performed at [facility], [place] [address], [place], [place] [address]   SARS CORONAVIRUS 2 (TAT 6-24 HRS) Nasopharyngeal Nasopharyngeal Swab     Status: Abnormal   Collection Time: 10/05/19  3:11 PM   Specimen: Nasopharyngeal Swab  Result Value Ref Range Status   SARS Coronavirus 2 POSITIVE (A) NEGATIVE Final    Comment: RESULT CALLED TO, READ BACK BY AND VERIFIED WITH: RN  TINA MICHAEL AT [facility] ON 10/06/2019 (  NOTE) SARS-CoV-2 target nucleic acids are DETECTED. The SARS-CoV-2 RNA is generally detectable in upper and lower respiratory specimens during the acute phase of infection. Positive results are indicative of the presence of SARS-CoV-2 RNA. Clinical correlation with patient history and other diagnostic information is  necessary to determine patient infection status. Positive results do not rule out bacterial infection or co-infection with other viruses.  The expected result is Negative. Fact Sheet for Patients: SugarRoll.be Fact Sheet for Healthcare Providers: https://www.woods-mathews.com/ This test is not yet approved or cleared by the [place] FDA and  has been authorized for detection and/or diagnosis of SARS-CoV-2 by FDA under an Emergency Use Authorization (EUA). This EUA will remain  in effect (meaning this te st can be used) for the duration of the COVID-19 declaration under Section 564(b)(1) of the Act, 21 U.S.C. section 360bbb-3(b)(1), unless the authorization is terminated or revoked sooner. Performed at [facility], [place] [address] [place], [place] [address]     Coagulation Studies: No results for input(s): LABPROT, INR in the last 72 hours.  Urinalysis: No  results for input(s): COLORURINE, LABSPEC, PHURINE, GLUCOSEU, HGBUR, BILIRUBINUR, KETONESUR, PROTEINUR, UROBILINOGEN, NITRITE, LEUKOCYTESUR in the last 72 hours.  Invalid input(s): APPERANCEUR    Imaging: No results found.   Medications:   . cefTRIAXone (ROCEPHIN)  IV Stopped (10/06/19 0037)  . remdesivir 200 mg in sodium chloride 0.9% 250 mL IVPB     Followed by  . [START ON 10/07/2019] remdesivir 100 mg in NS 100 mL     . amLODipine  5 mg Per Tube Daily  . B-complex with vitamin C  1 tablet Per Tube QHS  . Chlorhexidine Gluconate Cloth  6 each Topical Daily  . epoetin (EPOGEN/PROCRIT) injection  10,000 Units Intravenous Q M,W,F-HD  . feeding supplement (NEPRO CARB STEADY)  1,000 mL Per Tube Q24H  . feeding supplement (PRO-STAT SUGAR FREE 64)  30 mL Per Tube BID  . free water  50 mL Per Tube 6 X Daily  . gabapentin  100 mg Per Tube Daily  . insulin aspart  0-6 Units Subcutaneous Q4H  . ipratropium  2 puff Inhalation Q6H  . lacosamide  100 mg Per Tube BID  . mouth rinse  15 mL Mouth Rinse BID  . methylPREDNISolone (SOLU-MEDROL) injection  60 mg Intravenous Q6H  . mometasone-formoterol  2 puff Inhalation BID  . pantoprazole sodium  40 mg Per Tube BID   acetaminophen (TYLENOL) oral liquid 160 mg/5 mL, albuterol, diclofenac Sodium  Assessment/ Plan:  Ms. NOVALI VOLLMAN is a 79 y.o. black female with end stage renal disease on hemodialysis, hypertension, diabetes mellitus type II, diabetic neuropathy, seizure disorder, left hip repair, anemia of chronic kidney disease, secondary hyperparathyroidism.  COVID-19 positive.   [facility] M[facility] LUE AVG 79.5kg    1.  ESRD on HD: hold dialysis for today and transition to TTS schedule to accomodate patient for doing dialysis during the COVID shift.    2. Anemia of chronic kidney disease.  With GI bleed. Hemoglobin 9.3 Requiring PRBC transfusion on 3/10 and 3/11.  - Continue EPO with hemodialysis : change to TTS schedule -  pantoprazole.    3.  Secondary hyperparathyroidism. Calcium and phosphorus at goal. Outpatient PTH was low at 72. Holding binders and activated vitamin D.    4. Hypertension:   - amlodipine.    LOS: 8 Jaydin Boniface 3/19/20212:02 PM

## 2019-10-06 NOTE — Progress Notes (Signed)
No nepro Carbsteady available. Called dietary to deliver TF.

## 2019-10-06 NOTE — Progress Notes (Signed)
Following message sent to Dr Posey Pronto  pt with significant diarrhea can we send stool sample for Cdiff? It is very liquid and this will be 3rd stool in an hour, also pulling at lines and drains removing mittens unable to redirect. I know she had pulled her PEG tube before is attempting to pull her IV now. Arms Covered in Coban but patient is persistent in attempts. Off 02 monitoring and the sooner we can pull ECG and O2 sat monitoring the safer patient will be. Once stool sample neg for cdiff maybe we can give immodium. Request sent for sitter.   Addendum for 1030 MD response: Per Dr Posey Pronto verbal we can add Cdiff sample for loose stools, no sitter ordered, also gives authorization to remove EKG monitoring and O2 sat monitoring. MD noted patient was not previously agitated.   Action: Mittens removed to eval if patient agitation is due to presence of mittens. RN remained in room to evaluate patient behavior. Pt much more calm 15 minutes after removal of mittens,  and Coban from arm. Coban remains to patient right arm with IV and blankets tucked around patient to discourage access. Pt touching coban on right arm but now redirectible to stop. Still does not want medications administered by RN at this time. Will continue to offer medications to patient as patient allows administration.   Pt not requiring O2. Removed indepenently and sat found to be 97%

## 2019-10-07 DIAGNOSIS — R4182 Altered mental status, unspecified: Secondary | ICD-10-CM

## 2019-10-07 LAB — GLUCOSE, CAPILLARY
Glucose-Capillary: 120 mg/dL — ABNORMAL HIGH (ref 70–99)
Glucose-Capillary: 122 mg/dL — ABNORMAL HIGH (ref 70–99)
Glucose-Capillary: 133 mg/dL — ABNORMAL HIGH (ref 70–99)
Glucose-Capillary: 135 mg/dL — ABNORMAL HIGH (ref 70–99)
Glucose-Capillary: 140 mg/dL — ABNORMAL HIGH (ref 70–99)
Glucose-Capillary: 142 mg/dL — ABNORMAL HIGH (ref 70–99)

## 2019-10-07 LAB — RENAL FUNCTION PANEL
Albumin: 3.1 g/dL — ABNORMAL LOW (ref 3.5–5.0)
Anion gap: 19 — ABNORMAL HIGH (ref 5–15)
BUN: 156 mg/dL — ABNORMAL HIGH (ref 8–23)
CO2: 27 mmol/L (ref 22–32)
Calcium: 8.8 mg/dL — ABNORMAL LOW (ref 8.9–10.3)
Chloride: 96 mmol/L — ABNORMAL LOW (ref 98–111)
Creatinine, Ser: 8.68 mg/dL — ABNORMAL HIGH (ref 0.44–1.00)
GFR calc Af Amer: 5 mL/min — ABNORMAL LOW (ref >60)
GFR calc non Af Amer: 4 mL/min — ABNORMAL LOW (ref >60)
Glucose, Bld: 134 mg/dL — ABNORMAL HIGH (ref 70–99)
Phosphorus: 4.3 mg/dL (ref 2.5–4.6)
Potassium: 3.2 mmol/L — ABNORMAL LOW (ref 3.5–5.1)
Sodium: 142 mmol/L (ref 135–145)

## 2019-10-07 LAB — CBC
HCT: 30.8 % — ABNORMAL LOW (ref 36.0–46.0)
Hemoglobin: 9.8 g/dL — ABNORMAL LOW (ref 12.0–15.0)
MCH: 29.7 pg (ref 26.0–34.0)
MCHC: 31.8 g/dL (ref 30.0–36.0)
MCV: 93.3 fL (ref 80.0–100.0)
Platelets: 429 10*3/uL — ABNORMAL HIGH (ref 150–400)
RBC: 3.3 MIL/uL — ABNORMAL LOW (ref 3.87–5.11)
RDW: 16.4 % — ABNORMAL HIGH (ref 11.5–15.5)
WBC: 20.7 10*3/uL — ABNORMAL HIGH (ref 4.0–10.5)
nRBC: 2.6 % — ABNORMAL HIGH (ref 0.0–0.2)

## 2019-10-07 MED ORDER — ACETAMINOPHEN 325 MG PO TABS: 650.0000 mg | ORAL_TABLET | ORAL | Status: DC | PRN

## 2019-10-07 MED ORDER — EPOETIN ALFA 10000 UNIT/ML IJ SOLN
10000.0000 [IU] | INTRAMUSCULAR | Status: DC
  Administered 2019-10-07: 10000 [IU] via INTRAVENOUS
  Filled 2019-10-07 (×2): qty 1

## 2019-10-07 MED ORDER — ZINC OXIDE 40 % EX OINT
TOPICAL_OINTMENT | Freq: Four times a day (QID) | CUTANEOUS | Status: DC | PRN
  Filled 2019-10-07 (×2): qty 113

## 2019-10-07 NOTE — Progress Notes (Signed)
Pt allowed RN to give meds via PEG tube complete bed change for loose BM and detatch TF. Significant excoriation noted to peri area. Powder applied. Pt not tlerating cream administaration. Pt turned to side for Q2 turns

## 2019-10-07 NOTE — Progress Notes (Addendum)
Pt declining care, threatening to hit staff, attempted to help phlebotomist draw blood. Pt affect angry and uncooperative. Struggling during BP check so results are unlikely to be accurate.  Pt would not allow O2 sat monitoring or placement of 02. Pt currently has removed O2. Attempts to hit RN when attempting to replace 02. TF running.  Pt does not allow asesessmetn, will try again later as patient allows.    Addendum. Check 02 sat on Pt toes. O2 sat 98% on room air.

## 2019-10-07 NOTE — Progress Notes (Signed)
Hd started

## 2019-10-07 NOTE — Progress Notes (Signed)
Per Dialysis nurse, please hold TF administration and all medications until after dialysis with exception of meds for pain.

## 2019-10-07 NOTE — Progress Notes (Signed)
PROGRESS NOTE    Barbara [name]  DVV:[id number] DOB: [birth year] DOA: 09/27/2019 PCP: [name], MD   Brief Narrative: Barbara [name] is a 79 y.o. female with medical history significant for ESRD on HD MWF, hyperlipidemia, type 2 diabetes, COPD, asthma, GERD, bladder cancer, and iron deficiency anemia who presents for concerns of acute GI bleed and anemia. SP EGD and replacement of the G-tube. Pt came with anemia and hg of 4.8  And received 2 untis of PRBC in ed. She was also recently  hospitalized from 02/16 to 03/7 following a left total hip arthroplasty  (62/69/4854) complicated by toxic metabolic encephalopathy due to severe metabolic acidosis, and seizure activity (started on vimpat) requiring mechanical intubation/CRRTand PEG tube placement due to dysphagia then discharged to [facility].   She presented to [facility] ER on 03/10 via EMS from [facility] with coffee-ground emesis onset 2 days prior to presentation.  Lab results at the facility revealed hgb of 5 prompting ER visit.  It was also reported the pt pulled out her PEG tube.  In the ER lab results revealed Na+ 133, glucose 121, BUN 39, creatinine 4.37, albumin 2.5, lipase 63, creatinine 4.37, wbc 19.7, hgb 4.8 and COVID-19 negative.  ER physician also reported the pt had a grossly melanotic stool. Pt received 2 unit of pRBC's, protonix gtt started, and GI consulted.   She has egd on 09/28/19 which showed non-bleeding gastric ulcer.  Then patient developed respiratory failure with bilateral wheezes currently being transferred to stepdown unit. I suspect respiratory failure from volume overload from transfusion.  Pt transferred to floor on 3/17 and has been stable and Currently further plan is continue treat respiratory failure and monitor for stability [facility]. Pt is table and needs authorization for discharge to snf, spouse st bedside today and states daughter and himself cant take care of her. Per  requirement pt needs covid-19 pcr pending for d/c in am to a SNF.   Pt seen today is agitated and refuses care this si new behavior to me since a day ago, pt was almost discharged home  Days before yesterday however had covid-19 test and had to [facility] for remdesivir therapy.  3/20 I have d/w neurologist dr. Doy Mince to consult and she is also very familiar with patient and has seen her with plan  For ct head and labs. If pt' is stable we will d/c in am If bed available at facility.  Assessment & Plan:   Principal Problem:   Acute GI bleeding Active Problems:   Leukocytosis   ESRD (end stage renal disease) (HCC)   Type II diabetes mellitus with renal manifestations (HCC)   Sacral ulcer (HCC)   Acute respiratory failure with hypoxia (HCC)   Chronic diastolic CHF (congestive heart failure) (HCC)   Seizure (HCC)   PEG tube malfunction (HCC)   Acute GI bleed Hemoglobin of 9.8 and transfuse during dialysis. Cont iv ppi. Attribute to combination of GI bleed and iron def anemia and ACD due to her CKD,.    Acute blood loss anemia-resolved Anemia of chronic kidney disease Presented with GI bleed.  Hemoglobin dropped down to 8.1  pt on epo and transfuse as needed.   PEG tube dependency with dysphagia Placed on 09/20/2019 due to dysphagia Appreciate GI assistance for replacement of the G-tube. GI recommended to resume diet. Currently tolerating well.   Acute hypoxic respiratory failure/Acute on chronic systolic CHF/Acute bronchitis: Resolved . Pt is on Room Air  with normal oxygen saturations.    Leukocytosis/concern for cellulitis This appears to have been chronic for the past week.  No signs of fever.  Continue IV antibiotics   ESRD on HD MWF Creatinine is stable Need to monitor for any fluid overload with blood transfusion Appreciate nephrology assistance. Patient will require to sit in the chair.   Chronic diastolic CHF:  Hemodialysis for  Volume management   Type 2 diabetes  controlled. diet-controlled  Currently on sliding scale insulin. Also on Nepro for tube feeding. accuchecks ac/hs   Hx of seizure Continue home regimen per tube.   Stage 1 sacral and buttock ulcers  Wound care per RN    Dysphagia. Patient is significantly awake and alert. Speech therapy consulted for assessment for the diet. Per speech therapy patient was not eating enough likely from not being hungry while she was on tube feeding. Given lethargy currently holding off on further work-up.  Appreciate speech therapy recommendation.   Acute encephalopathy-  metabolic  From medication.  Received fentanyl early this morning. Continues to report severe right leg cramps.  And given Robaxin. Continue to monitor for now.  Covid-19: Pt was found to be covid-19 positive and started on remdesivir, however later today received report from nurse that  Results are erroneous and due to lack of iv access also iv t/t d/c and changed to po needed meds only.   DVT prophylaxis: SCD's Code Status: Full Code Family Communication: Spouse at bedside [facility]position Plan: [facility], Insurance authorization for d/c to facilty in am and covid-19 pcr test.   Consultants:   Gertie Fey  nephro   Procedures: Dialysis M/W/F   Subjective: Pt seen today for f/u and is stable spouse wants her to go to snf.  Pt denies any complaints of chest pain or sob.   Objective: Vitals:   10/07/19 1700 10/07/19 1715 10/07/19 1730 10/07/19 1745  BP: (!) 148/58 (!) 144/58 (!) 148/63 (!) 138/51  Pulse: 95 97 97 95  Resp: 15 16 19 19   Temp:      TempSrc:      SpO2:      Weight:      Height:       No intake or output data in the 24 hours ending 10/07/19 1757 Filed Weights   10/03/19 0425 10/05/19 0600 10/06/19 0649  Weight: 78.4 kg 76.2 kg 77.3 kg    Examination: Blood pressure (!) 138/51, pulse 95, temperature 98.2 F (36.8 C), temperature source Oral, resp. rate 19, height 5' 6"  (1.676 m), weight 77.3 kg,  SpO2 98 %. General exam: Appears calm and comfortable  Respiratory system: Clear to auscultation. Respiratory effort normal. Cardiovascular system: S1 & S2 heard, RRR. No JVD, murmurs, rubs, gallops or clicks. No pedal edema. Gastrointestinal system: Abdomen is nondistended, soft and nontender. No organomegaly or masses felt. Normal bowel sounds heard. Central nervous system: Alert and oriented. No focal neurological deficits. Extremities: Symmetric 5 x 5 power. Skin: No rashes, lesions or ulcers Psychiatry: Judgement and insight appear normal. Mood & affect appropriate.   Data Reviewed:  CBC: Recent Labs  Lab 10/04/19 0805 10/05/19 1455 10/06/19 0613 10/06/19 1332 10/07/19 1142  WBC 11.3* 13.1* 13.2* 15.3* 20.7*  HGB 7.9* 8.1* 9.0* 9.3* 9.8*  HCT 26.1* 26.0* 28.7* 29.9* 30.8*  MCV 96.0 92.2 92.6 93.4 93.3  PLT 307 320 379 379 034*   Basic Metabolic Panel: Recent Labs  Lab 10/02/19 0500 10/02/19 0930 10/03/19 0423 10/04/19 0805 10/05/19 1455 10/06/19 7425 10/07/19  1142  NA  --    < > 141 139 140 140 142  K  --    < > 4.0 3.9 3.5 3.6 3.2*  CL  --    < > 98 95* 96* 96* 96*  CO2  --    < > 31 27 27 26 27   GLUCOSE  --    < > 174* 142* 153* 175* 134*  BUN  --    < > 48* 91* 81* 108* 156*  CREATININE  --    < > 5.47* 7.02* 5.68* 6.92* 8.68*  CALCIUM  --    < > 8.8* 9.0 9.0 9.0 8.8*  MG 2.0  --  2.0  --   --   --   --   PHOS  --    < > 3.9 3.3 3.2 4.1 4.3   < > = values in this interval not displayed.   GFR: Estimated Creatinine Clearance: 5.5 mL/min (A) (by C-G formula based on SCr of 8.68 mg/dL (H)). Liver Function Tests: Recent Labs  Lab 10/03/19 0423 10/04/19 0805 10/05/19 1455 10/06/19 0613 10/07/19 1142  ALBUMIN 2.4* 2.7* 3.0* 3.2* 3.1*   No results for input(s): LIPASE, AMYLASE in the last 168 hours. No results for input(s): AMMONIA in the last 168 hours. Coagulation Profile: No results for input(s): INR, PROTIME in the last 168 hours. Cardiac  Enzymes: No results for input(s): CKTOTAL, CKMB, CKMBINDEX, TROPONINI in the last 168 hours. BNP (last 3 results) No results for input(s): PROBNP in the last 8760 hours. HbA1C: No results for input(s): HGBA1C in the last 72 hours. CBG: Recent Labs  Lab 10/07/19 0018 10/07/19 0421 10/07/19 0838 10/07/19 1202 10/07/19 1603  GLUCAP 135* 120* 122* 142* 140*    Recent Results (from the past 240 hour(s))  SARS CORONAVIRUS 2 (TAT 6-24 HRS) Nasopharyngeal Nasopharyngeal Swab     Status: None   Collection Time: 09/28/19  1:40 AM   Specimen: Nasopharyngeal Swab  Result Value Ref Range Status   SARS Coronavirus 2 NEGATIVE NEGATIVE Final    Comment: (NOTE) SARS-CoV-2 target nucleic acids are NOT DETECTED. The SARS-CoV-2 RNA is generally detectable in upper and lower respiratory specimens during the acute phase of infection. Negative results do not preclude SARS-CoV-2 infection, do not rule out co-infections with other pathogens, and should not be used as the sole basis for treatment or other patient management decisions. Negative results must be combined with clinical observations, patient history, and epidemiological information. The expected result is Negative. Fact Sheet for Patients: SugarRoll.be Fact Sheet for Healthcare Providers: https://www.woods-mathews.com/ This test is not yet approved or cleared by the [place] FDA and  has been authorized for detection and/or diagnosis of SARS-CoV-2 by FDA under an Emergency Use Authorization (EUA). This EUA will remain  in effect (meaning this test can be used) for the duration of the COVID-19 declaration under Section 56 4(b)(1) of the Act, 21 U.S.C. section 360bbb-3(b)(1), unless the authorization is terminated or revoked sooner. Performed at [facility], [place] [address] [place], [place] [address]   MRSA PCR Screening     Status: None   Collection Time: 09/28/19  3:08 AM    Specimen: Nasal Mucosa; Nasopharyngeal  Result Value Ref Range Status   MRSA by PCR NEGATIVE NEGATIVE Final    Comment:        The GeneXpert MRSA Assay (FDA approved for NASAL specimens only), is one component of a comprehensive MRSA colonization surveillance program. It is not  intended to diagnose MRSA infection nor to guide or monitor treatment for MRSA infections. Performed at [facility], [place], [place] [address]   SARS CORONAVIRUS 2 (TAT 6-24 HRS) Nasopharyngeal Nasopharyngeal Swab     Status: None   Collection Time: 10/01/19 12:28 PM   Specimen: Nasopharyngeal Swab  Result Value Ref Range Status   SARS Coronavirus 2 NEGATIVE NEGATIVE Final    Comment: (NOTE) SARS-CoV-2 target nucleic acids are NOT DETECTED. The SARS-CoV-2 RNA is generally detectable in upper and lower respiratory specimens during the acute phase of infection. Negative results do not preclude SARS-CoV-2 infection, do not rule out co-infections with other pathogens, and should not be used as the sole basis for treatment or other patient management decisions. Negative results must be combined with clinical observations, patient history, and epidemiological information. The expected result is Negative. Fact Sheet for Patients: SugarRoll.be Fact Sheet for Healthcare Providers: https://www.woods-mathews.com/ This test is not yet approved or cleared by the [place] FDA and  has been authorized for detection and/or diagnosis of SARS-CoV-2 by FDA under an Emergency Use Authorization (EUA). This EUA will remain  in effect (meaning this test can be used) for the duration of the COVID-19 declaration under Section 56 4(b)(1) of the Act, 21 U.S.C. section 360bbb-3(b)(1), unless the authorization is terminated or revoked sooner. Performed at [facility], [place] [address] [place], [place] [address]   SARS CORONAVIRUS 2 (TAT 6-24 HRS)  Nasopharyngeal Nasopharyngeal Swab     Status: None   Collection Time: 10/05/19  3:11 PM   Specimen: Nasopharyngeal Swab  Result Value Ref Range Status   SARS Coronavirus 2 NEGATIVE NEGATIVE Corrected    Comment: RESULT CALLED TO, READ BACK BY AND VERIFIED WITH: THIS IS A CORRECTED REPORT. PREVIOUSLY CALLED REPORT WAS POSITIVE. CALLED TO [facility] RN 10/06/19 1755 JDW Performed at [place] [address], [place], [place] [address] CORRECTED ON 03/19 AT 5993: PREVIOUSLY REPORTED AS POSITIVE RESULT CALLED TO, READ BACK BY AND VERIFIED WITH: RN  TINA MICHAEL AT [facility] ON 10/06/2019       Radiology Studies: No results found.  Scheduled Meds: . B-complex with vitamin C  1 tablet Per Tube QHS  . Chlorhexidine Gluconate Cloth  6 each Topical Daily  . epoetin (EPOGEN/PROCRIT) injection  10,000 Units Intravenous Q T,Th,Sa-HD  . feeding supplement (NEPRO CARB STEADY)  1,000 mL Per Tube Q24H  . feeding supplement (PRO-STAT SUGAR FREE 64)  30 mL Per Tube BID  . free water  50 mL Per Tube 6 X Daily  . gabapentin  100 mg Per Tube Daily  . insulin aspart  0-6 Units Subcutaneous Q4H  . ipratropium  2 puff Inhalation Q6H  . lacosamide  100 mg Per Tube BID  . mouth rinse  15 mL Mouth Rinse BID  . methylPREDNISolone  4 mg Per Tube 3 x daily with food  . [START ON 10/08/2019] methylPREDNISolone  4 mg Per Tube 4X daily taper  . methylPREDNISolone  8 mg Per Tube Nightly  . methylPREDNISolone  8 mg Per Tube Nightly  . mometasone-formoterol  2 puff Inhalation BID  . pantoprazole sodium  40 mg Per Tube BID      LOS: 9 days    Para Skeans, DO [facility] If 7PM-7AM, please contact night-coverage [email or web address] Password [facility] 10/07/2019, 5:57 PM

## 2019-10-07 NOTE — Progress Notes (Signed)
Hd completed

## 2019-10-07 NOTE — Progress Notes (Signed)
Wanting to provide pain medication to patient as patient is holding her jaw. Acetaminophen unavailable in pyxis. Message sent to pharmacy. C diff sample sent for patient. Awaiting results to eval for how to treat patient diarrhea.

## 2019-10-07 NOTE — Consult Note (Addendum)
Reason for Consult:AMS Requesting Physician: Posey Pronto  CC: AMS  I have been asked by Dr. Posey Pronto to see this patient in consultation for AMS.  HPI: Barbara [name] is an 79 y.o. female who is unable to provide any history due to mental status.  All history obtained from the chart.  Patient with ha history of multiple medical problems including ESRD on HD MWF, hyperlipidemia, type 2 diabetes, COPD, asthma, GERD, bladder cancer, multiple episodes of altered mental status and iron deficiency anemia who presented for concerns of acute GI bleed and anemia.  Patient with a recent prolonged hospitalization after elective knee surgery during which she became altered.  Was noted to have a seizure during that hospitalization as well and was started on Vimpat.  Per reports of husband although she has improved some she has not returned to baseline.  During this admission developed respiratory failure and required transfer to stepdown.  Transferred back to the floor on 3/17.  No seizure activity noted but patient has become altered.    Past Medical History:  Diagnosis Date  . Adult celiac disease   . Anemia associated with chronic renal failure 2017   blood transfusion last week 10/17  . Arthritis   . Asthma   . Cancer ([place]) 2017   bladder  . Chronic kidney disease   . 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  dr. Candiss Norse and dr. Holley Raring follow her  . COPD (chronic obstructive pulmonary disease) ([place])   . Coronary artery disease   . Diabetes mellitus without complication ([place])   . Dialysis patient ([place])    Tues, Thurs, Sat  . Dyspnea    with exertion  . Elevated lipids   . GERD (gastroesophageal reflux disease)   . Hematuria   . Hypertension   . Lower back pain   . Neuropathy   . Oxygen dependent    at hs  . Personal history of chemotherapy 2017   bladder ca  . Personal history of radiation therapy 2017   bladder ca  . Sleep apnea    uses cpap  . Urinary obstruction 01/2016     Past Surgical History:  Procedure Laterality Date  . A/V SHUNT INTERVENTION N/A 05/12/2018   Procedure: A/V SHUNT INTERVENTION;  Surgeon: Algernon Huxley, MD;  Location: [facility];  Service: Cardiovascular;  Laterality: N/A;  . A/V SHUNT INTERVENTION Left 07/29/2018   Procedure: A/V SHUNT INTERVENTION;  Surgeon: Katha Cabal, MD;  Location: [facility];  Service: Cardiovascular;  Laterality: Left;  . ABDOMINAL HYSTERECTOMY    . AV FISTULA PLACEMENT Left 02/17/2017   Procedure: INSERTION OF ARTERIOVENOUS (AV) GORE-TEX GRAFT ARM(BRACHIAL AXILLARY);  Surgeon: Katha Cabal, MD;  Location: [facility];  Service: Vascular;  Laterality: Left;  . CYSTOSCOPY W/ RETROGRADES Bilateral 02/17/2016   Procedure: CYSTOSCOPY WITH RETROGRADE PYELOGRAM;  Surgeon: Hollice Espy, MD;  Location: [facility];  Service: Urology;  Laterality: Bilateral;  . CYSTOSCOPY W/ RETROGRADES Bilateral 10/12/2016   Procedure: CYSTOSCOPY WITH RETROGRADE PYELOGRAM;  Surgeon: Hollice Espy, MD;  Location: [facility];  Service: Urology;  Laterality: Bilateral;  . CYSTOSCOPY W/ RETROGRADES Bilateral 06/09/2017   Procedure: CYSTOSCOPY WITH RETROGRADE PYELOGRAM;  Surgeon: Hollice Espy, MD;  Location: [facility];  Service: Urology;  Laterality: Bilateral;  . CYSTOSCOPY W/ RETROGRADES Bilateral 10/07/2017   Procedure: CYSTOSCOPY WITH RETROGRADE PYELOGRAM;  Surgeon: Hollice Espy, MD;  Location: [facility];  Service: Urology;  Laterality: Bilateral;  . CYSTOSCOPY W/  URETERAL STENT PLACEMENT Left 05/12/2016   Procedure: CYSTOSCOPY WITH STENT REPLACEMENT;  Surgeon: Hollice Espy, MD;  Location: [facility];  Service: Urology;  Laterality: Left;  . CYSTOSCOPY W/ URETERAL STENT PLACEMENT Left 10/12/2016   Procedure: CYSTOSCOPY WITH STENT REPLACEMENT;  Surgeon: Hollice Espy, MD;  Location: [facility];  Service: Urology;  Laterality: Left;  . CYSTOSCOPY WITH BIOPSY N/A 06/09/2017   Procedure: CYSTOSCOPY WITH BLADDER BIOPSY;   Surgeon: Hollice Espy, MD;  Location: [facility];  Service: Urology;  Laterality: N/A;  . CYSTOSCOPY WITH STENT PLACEMENT Left 01/21/2016   Procedure: CYSTOSCOPY WITH double J STENT PLACEMENT;  Surgeon: Franchot Gallo, MD;  Location: [facility];  Service: Urology;  Laterality: Left;  . CYSTOSCOPY WITH STENT PLACEMENT Right 10/12/2016   Procedure: CYSTOSCOPY WITH STENT PLACEMENT;  Surgeon: Hollice Espy, MD;  Location: [facility];  Service: Urology;  Laterality: Right;  . DIALYSIS/PERMA CATHETER INSERTION N/A 11/20/2016   Procedure: Dialysis/Perma Catheter Insertion;  Surgeon: Algernon Huxley, MD;  Location: [place];  Service: Cardiovascular;  Laterality: N/A;  . DIALYSIS/PERMA CATHETER REMOVAL N/A 03/30/2017   Procedure: DIALYSIS/PERMA CATHETER REMOVAL;  Surgeon: Katha Cabal, MD;  Location: [place] [facility];  Service: Cardiovascular;  Laterality: N/A;  . ESOPHAGOGASTRODUODENOSCOPY (EGD) WITH PROPOFOL N/A 09/28/2019   Procedure: ESOPHAGOGASTRODUODENOSCOPY (EGD) WITH PROPOFOL;  Surgeon: Toledo, Benay Pike, MD;  Location: [facility];  Service: Gastroenterology;  Laterality: N/A;  . KIDNEY SURGERY  01/21/2016   IR NEPHROSTOMY PLACEMENT LEFT   . PEG PLACEMENT N/A 09/20/2019   Procedure: PERCUTANEOUS ENDOSCOPIC GASTROSTOMY (PEG) PLACEMENT;  Surgeon: Toledo, Benay Pike, MD;  Location: [facility];  Service: Gastroenterology;  Laterality: N/A;  . PERIPHERAL VASCULAR CATHETERIZATION N/A 04/07/2016   Procedure: Glori Luis Cath Insertion;  Surgeon: Katha Cabal, MD;  Location: [place] [facility];  Service: Cardiovascular;  Laterality: N/A;  . PERIPHERAL VASCULAR THROMBECTOMY Left 06/02/2017   Procedure: PERIPHERAL VASCULAR THROMBECTOMY;  Surgeon: Algernon Huxley, MD;  Location: [place] [facility];  Service: Cardiovascular;  Laterality: Left;  . PERIPHERAL VASCULAR THROMBECTOMY Left 01/28/2018   Procedure: PERIPHERAL VASCULAR THROMBECTOMY;  Surgeon: Katha Cabal, MD;  Location: [place] [facility];  Service: Cardiovascular;  Laterality: Left;  . PERIPHERAL VASCULAR THROMBECTOMY Left 03/14/2018   Procedure: PERIPHERAL VASCULAR THROMBECTOMY;  Surgeon: Algernon Huxley, MD;  Location: [facility] [facility];  Service: Cardiovascular;  Laterality: Left;  . PERIPHERAL VASCULAR THROMBECTOMY Left 03/16/2018   Procedure: PERIPHERAL VASCULAR THROMBECTOMY;  Surgeon: Katha Cabal, MD;  Location: [place] [facility];  Service: Cardiovascular;  Laterality: Left;  . PORTA CATH REMOVAL N/A 02/07/2019   Procedure: PORTA CATH REMOVAL;  Surgeon: Katha Cabal, MD;  Location: [place] [facility];  Service: Cardiovascular;  Laterality: N/A;  . PORTACATH PLACEMENT Right   . ROTATOR CUFF REPAIR Left   . TEMPORARY DIALYSIS CATHETER N/A 05/11/2018   Procedure: TEMPORARY DIALYSIS CATHETER;  Surgeon: Katha Cabal, MD;  Location: [place] [facility];  Service: Cardiovascular;  Laterality: N/A;  . TEMPORARY DIALYSIS CATHETER Left 07/27/2018   Procedure: TEMPORARY DIALYSIS CATHETER;  Surgeon: Katha Cabal, MD;  Location: [place] [facility];  Service: Cardiovascular;  Laterality: Left;  . TOTAL HIP ARTHROPLASTY Left 09/05/2019   Procedure: TOTAL HIP ARTHROPLASTY;  Surgeon: Corky Mull, MD;  Location: [facility];  Service: Orthopedics;  Laterality: Left;  . TRANSURETHRAL RESECTION OF BLADDER TUMOR N/A 02/17/2016   Procedure: TRANSURETHRAL RESECTION OF BLADDER TUMOR (TURBT)-LARGE;  Surgeon: Hollice Espy, MD;  Location: [facility];  Service: Urology;  Laterality: N/A;  . TRANSURETHRAL RESECTION OF BLADDER TUMOR N/A 10/07/2017   Procedure: TRANSURETHRAL RESECTION OF BLADDER TUMOR (TURBT)-small;  Surgeon: Hollice Espy, MD;  Location: [facility];  Service: Urology;  Laterality: N/A;  . URETEROSCOPY Left 02/17/2016   Procedure: URETEROSCOPY;  Surgeon: Hollice Espy, MD;  Location: [facility];  Service: Urology;  Laterality: Left;  . URETEROSCOPY Right 10/12/2016   Procedure: URETEROSCOPY;   Surgeon: Hollice Espy, MD;  Location: [facility];  Service: Urology;  Laterality: Right;    Family History  Problem Relation Age of Onset  . Diabetes Mother   . Cancer Father   . Breast cancer Neg Hx   . Kidney disease Neg Hx     Social History:  reports that she quit smoking about 17 years ago. Her smoking use included cigarettes. She has never used smokeless tobacco. She reports that she does not drink alcohol or use drugs.  Allergies  Allergen Reactions  . Glipizide Er Other (See Comments)    Hypoglycemia   . Percocet [Oxycodone-Acetaminophen] Hives    Medications:  I have reviewed the patient's current medications. Prior to Admission:  Medications Prior to Admission  Medication Sig Dispense Refill Last Dose  . albuterol (PROVENTIL HFA;VENTOLIN HFA) 108 (90 Base) MCG/ACT inhaler Inhale 2 puffs into the lungs every 6 (six) hours as needed for wheezing or shortness of breath.    prn at prn  . Amino Acids-Protein Hydrolys (FEEDING SUPPLEMENT, PRO-STAT SUGAR FREE 64,) LIQD Place 30 mLs into feeding tube 2 (two) times daily. 887 mL 0 unknown at unknown  . amLODipine (NORVASC) 5 MG tablet Place 1 tablet (5 mg total) into feeding tube daily.   unknown at unknown  . atorvastatin (LIPITOR) 40 MG tablet Place 40 mg into feeding tube at bedtime.    unknown at unknown  . B Complex-C (B-COMPLEX WITH VITAMIN C) tablet Place 1 tablet into feeding tube at bedtime.   unknown at unknown  . Calcium Acetate 667 MG TABS Place 1,334 mg into feeding tube 3 (three) times daily.   unknown at unknown  . calcium carbonate (TUMS - DOSED IN MG ELEMENTAL CALCIUM) 500 MG chewable tablet Place 1 tablet into feeding tube at bedtime.    unknown at unknown  . cholecalciferol (VITAMIN D) 1000 units tablet Take 1 tablet (1,000 Units total) by mouth daily.   unknown at unknown  . epoetin alfa (EPOGEN) 10000 UNIT/ML injection Inject 1 mL (10,000 Units total) into the vein every Monday, Wednesday, and Friday with  hemodialysis. 1 mL 0 unknown at unknown  . esomeprazole (NEXIUM) 40 MG packet 40 mg daily before breakfast. Per tube   unknown at unknown  . ferrous sulfate 300 (60 Fe) MG/5ML syrup Place 324 mg into feeding tube at bedtime.   unknown at unknown  . gabapentin (NEURONTIN) 300 MG capsule Place 2 capsules (600 mg total) into feeding tube 3 (three) times daily. (Patient taking differently: Place 300 mg into feeding tube 3 (three) times daily. )   unknown at unknown  . heparin 5000 UNIT/ML injection Inject 1 mL (5,000 Units total) into the skin every 12 (twelve) hours. 1 mL 0 unknown at unknown  . lacosamide 100 MG TABS Place 1 tablet (100 mg total) into feeding tube 2 (two) times daily. 60 tablet  unknown at unknown  . multivitamin (RENA-VIT) TABS tablet Place 1 tablet into feeding tube at bedtime.  0 unknown at unknown  . oxybutynin (DITROPAN) 5 MG tablet  Place 1 tablet (5 mg total) into feeding tube 3 (three) times daily.   unknown at unknown  . OXYGEN Inhale 2 L into the lungs at bedtime.   unknown at unknown  . Blood Glucose Monitoring Suppl (ACCU-CHEK AVIVA PLUS) w/Device KIT (USE TO MONITOR BLOOD SUGARS.)  0   . NON FORMULARY Place 1 Units into the nose at bedtime. CPAP Time of use 2100-0600     . Water For Irrigation, Sterile (FREE WATER) SOLN Place 50 mLs into feeding tube every 4 (four) hours. (Patient not taking: Reported on 09/27/2019)   Not Taking at Unknown time   Scheduled: . B-complex with vitamin C  1 tablet Per Tube QHS  . Chlorhexidine Gluconate Cloth  6 each Topical Daily  . epoetin (EPOGEN/PROCRIT) injection  10,000 Units Intravenous Q T,Th,Sa-HD  . feeding supplement (NEPRO CARB STEADY)  1,000 mL Per Tube Q24H  . feeding supplement (PRO-STAT SUGAR FREE 64)  30 mL Per Tube BID  . free water  50 mL Per Tube 6 X Daily  . gabapentin  100 mg Per Tube Daily  . insulin aspart  0-6 Units Subcutaneous Q4H  . ipratropium  2 puff Inhalation Q6H  . lacosamide  100 mg Per Tube BID  . mouth  rinse  15 mL Mouth Rinse BID  . methylPREDNISolone  4 mg Per Tube 3 x daily with food  . [START ON 10/08/2019] methylPREDNISolone  4 mg Per Tube 4X daily taper  . methylPREDNISolone  8 mg Per Tube Nightly  . methylPREDNISolone  8 mg Per Tube Nightly  . mometasone-formoterol  2 puff Inhalation BID  . pantoprazole sodium  40 mg Per Tube BID    ROS: Unable to provide due to mental status  Physical Examination: Blood pressure (!) 176/67, pulse 82, temperature 98.2 F (36.8 C), resp. rate 16, height 5' 6"  (1.676 m), weight 77.3 kg, SpO2 98 %.  HEENT-  Normocephalic, no lesions, without obvious abnormality.  Normal external eye and conjunctiva.  Normal TM's bilaterally.  Normal auditory canals and external ears. Normal external nose, mucus membranes and septum.  Normal pharynx. Cardiovascular- S1, S2 normal, pulses palpable throughout   Lungs- No wheezes Abdomen- some pain to palpation of abdomen Extremities- no edema Lymph-no adenopathy palpable Musculoskeletal-pain on palpation of extremities Skin-warm and dry, no hyperpigmentation, vitiligo, or suspicious lesions  Neurological Examination   Mental Status: Eyes open.  Speech unintelligible.  Appears easily agitated.  Does not follow commands.   Cranial Nerves: II: Blinks to bilateral confrontation, pupils equal, round, reactive to light and accommodation III,IV, VI: ptosis not present, extra-ocular motions intact bilaterally V,VII: face symmetric VIII: Unable to test IX,X: Unable to test XI: Unable to test XII: Unable to test Motor: Moves all extremities spontaneously Sensory: Responds out of proportion to stimuli bilaterally Deep Tendon Reflexes: Symmetric throughout Plantars: Right: mute   Left: mute Cerebellar: Unable to test Gait: not tested due to safety concerns   Laboratory Studies:   Basic Metabolic Panel: Recent Labs  Lab 10/02/19 0500 10/02/19 0930 10/03/19 0423 10/03/19 0423 10/04/19 0805 10/04/19 0805  10/05/19 1455 10/06/19 0613 10/07/19 1142  NA  --    < > 141  --  139  --  140 140 142  K  --    < > 4.0  --  3.9  --  3.5 3.6 3.2*  CL  --    < > 98  --  95*  --  96* 96* 96*  CO2  --    < >  31  --  27  --  27 26 27   GLUCOSE  --    < > 174*  --  142*  --  153* 175* 134*  BUN  --    < > 48*  --  91*  --  81* 108* 156*  CREATININE  --    < > 5.47*  --  7.02*  --  5.68* 6.92* 8.68*  CALCIUM  --    < > 8.8*   < > 9.0   < > 9.0 9.0 8.8*  MG 2.0  --  2.0  --   --   --   --   --   --   PHOS  --    < > 3.9  --  3.3  --  3.2 4.1 4.3   < > = values in this interval not displayed.    Liver Function Tests: Recent Labs  Lab 10/03/19 0423 10/04/19 0805 10/05/19 1455 10/06/19 0613 10/07/19 1142  ALBUMIN 2.4* 2.7* 3.0* 3.2* 3.1*   No results for input(s): LIPASE, AMYLASE in the last 168 hours. No results for input(s): AMMONIA in the last 168 hours.  CBC: Recent Labs  Lab 10/04/19 0805 10/05/19 1455 10/06/19 0613 10/06/19 1332 10/07/19 1142  WBC 11.3* 13.1* 13.2* 15.3* 20.7*  HGB 7.9* 8.1* 9.0* 9.3* 9.8*  HCT 26.1* 26.0* 28.7* 29.9* 30.8*  MCV 96.0 92.2 92.6 93.4 93.3  PLT 307 320 379 379 429*    Cardiac Enzymes: No results for input(s): CKTOTAL, CKMB, CKMBINDEX, TROPONINI in the last 168 hours.  BNP: Invalid input(s): POCBNP  CBG: Recent Labs  Lab 10/06/19 2111 10/07/19 0018 10/07/19 0421 10/07/19 0838 10/07/19 1202  GLUCAP 211* 135* 120* 122* 142*    Microbiology: Results for orders placed or performed during the hospital encounter of 09/27/19  SARS CORONAVIRUS 2 (TAT 6-24 HRS) Nasopharyngeal Nasopharyngeal Swab     Status: None   Collection Time: 09/28/19  1:40 AM   Specimen: Nasopharyngeal Swab  Result Value Ref Range Status   SARS Coronavirus 2 NEGATIVE NEGATIVE Final    Comment: (NOTE) SARS-CoV-2 target nucleic acids are NOT DETECTED. The SARS-CoV-2 RNA is generally detectable in upper and lower respiratory specimens during the acute phase of infection.  Negative results do not preclude SARS-CoV-2 infection, do not rule out co-infections with other pathogens, and should not be used as the sole basis for treatment or other patient management decisions. Negative results must be combined with clinical observations, patient history, and epidemiological information. The expected result is Negative. Fact Sheet for Patients: SugarRoll.be Fact Sheet for Healthcare Providers: https://www.woods-mathews.com/ This test is not yet approved or cleared by the [place] FDA and  has been authorized for detection and/or diagnosis of SARS-CoV-2 by FDA under an Emergency Use Authorization (EUA). This EUA will remain  in effect (meaning this test can be used) for the duration of the COVID-19 declaration under Section 56 4(b)(1) of the Act, 21 U.S.C. section 360bbb-3(b)(1), unless the authorization is terminated or revoked sooner. Performed at [facility], [place] [address], [place], [place] [address]   MRSA PCR Screening     Status: None   Collection Time: 09/28/19  3:08 AM   Specimen: Nasal Mucosa; Nasopharyngeal  Result Value Ref Range Status   MRSA by PCR NEGATIVE NEGATIVE Final    Comment:        The GeneXpert MRSA Assay (FDA approved for NASAL specimens only), is one component of a comprehensive MRSA colonization surveillance program.  It is not intended to diagnose MRSA infection nor to guide or monitor treatment for MRSA infections. Performed at [facility], [place], [place] [address]   SARS CORONAVIRUS 2 (TAT 6-24 HRS) Nasopharyngeal Nasopharyngeal Swab     Status: None   Collection Time: 10/01/19 12:28 PM   Specimen: Nasopharyngeal Swab  Result Value Ref Range Status   SARS Coronavirus 2 NEGATIVE NEGATIVE Final    Comment: (NOTE) SARS-CoV-2 target nucleic acids are NOT DETECTED. The SARS-CoV-2 RNA is generally detectable in upper and lower respiratory  specimens during the acute phase of infection. Negative results do not preclude SARS-CoV-2 infection, do not rule out co-infections with other pathogens, and should not be used as the sole basis for treatment or other patient management decisions. Negative results must be combined with clinical observations, patient history, and epidemiological information. The expected result is Negative. Fact Sheet for Patients: SugarRoll.be Fact Sheet for Healthcare Providers: https://www.woods-mathews.com/ This test is not yet approved or cleared by the [place] FDA and  has been authorized for detection and/or diagnosis of SARS-CoV-2 by FDA under an Emergency Use Authorization (EUA). This EUA will remain  in effect (meaning this test can be used) for the duration of the COVID-19 declaration under Section 56 4(b)(1) of the Act, 21 U.S.C. section 360bbb-3(b)(1), unless the authorization is terminated or revoked sooner. Performed at [facility], [place] [address], [place], [place] [address]   SARS CORONAVIRUS 2 (TAT 6-24 HRS) Nasopharyngeal Nasopharyngeal Swab     Status: None   Collection Time: 10/05/19  3:11 PM   Specimen: Nasopharyngeal Swab  Result Value Ref Range Status   SARS Coronavirus 2 NEGATIVE NEGATIVE Corrected    Comment: RESULT CALLED TO, READ BACK BY AND VERIFIED WITH: THIS IS A CORRECTED REPORT. PREVIOUSLY CALLED REPORT WAS POSITIVE. CALLED TO N [facility] RN 10/06/19 1755 JDW Performed at [facility] [address] [place], [place] [address] CORRECTED ON 03/19 AT 2130: PREVIOUSLY REPORTED AS POSITIVE RESULT CALLED TO, READ BACK BY AND VERIFIED WITH: RN  TINA MICHAEL AT [facility] ON 10/06/2019     Coagulation Studies: No results for input(s): LABPROT, INR in the last 72 hours.  Urinalysis: No results for input(s): COLORURINE, LABSPEC, PHURINE, GLUCOSEU, HGBUR, BILIRUBINUR, KETONESUR, PROTEINUR, UROBILINOGEN,  NITRITE, LEUKOCYTESUR in the last 168 hours.  Invalid input(s): APPERANCEUR  Lipid Panel:     Component Value Date/Time   CHOL 100 08/03/2012 0250   TRIG 235 (H) 08/03/2012 0250   HDL 31 (L) 08/03/2012 0250   VLDL 47 (H) 08/03/2012 0250   LDLCALC 22 08/03/2012 0250    HgbA1C:  Lab Results  Component Value Date   HGBA1C 5.7 (H) 10/03/2019    Urine Drug Screen:  No results found for: LABOPIA, COCAINSCRNUR, LABBENZ, AMPHETMU, THCU, LABBARB  Alcohol Level: No results for input(s): ETH in the last 168 hours.  Other results: EKG: sinus rhythm at 93 bpm  Imaging: No results found.   Assessment/Plan: 79 year old female with multiple medical problems admitted for GIB who has now developed an altered mental status.  Patient has had multiple episodes in the past when declined cognitively when experiencing medical stressors.  Last event was with last hospitalization in February.  Patient had not completely returned to baseline prior to this hospitalization.  Patient on Vimpat.  Prior work ups have been unremarkable and have included MRI's with last being in February of this year and showing no changes and EEG's  which have been consistent with encephalopathy.  Time and treatment of underlying medical issues have been the only factors in the past that have led to cognitive improvement.    Recommendations: 1. Serum ammonia level 2. Head CT without contrast 3. Agree with continued treatment of medical issues 4. PT/OT 5. Continue Vimpat at current dose  Alexis Goodell, MD Neurology [phone] 10/07/2019, 3:28 PM

## 2019-10-07 NOTE — Progress Notes (Signed)
[facility]  ROUNDING NOTE   Subjective:   Patient is doing fair Still confused, reports that she does not want to go to work Husband in room when seen  Oral Intake appears to be poor Husband and nurse report that she still has loose stools but it is slowing down.  She was changed one time today   Objective:  Vital signs in last 24 hours:  Temp:  [98.2 F (36.8 C)] 98.2 F (36.8 C) (03/20 1520) Pulse Rate:  [82-98] 98 (03/20 1630) Resp:  [14-20] 16 (03/20 1630) BP: (145-206)/(59-70) 145/67 (03/20 1630) SpO2:  [96 %-98 %] 98 % (03/20 1530)  Weight change:  Filed Weights   10/03/19 0425 10/05/19 0600 10/06/19 0649  Weight: 78.4 kg 76.2 kg 77.3 kg    Intake/Output: I/O last 3 completed shifts: In: 2942 [NG/GT:2942] Out: -    Intake/Output this shift:  No intake/output data recorded.  Physical Exam: General: NAD, patient resting in bed   Head: Normocephalic, atraumatic. Moist oral mucosal membranes  Eyes: Anicteric,   Lungs:  Clear to auscultation, On supplemental O2 [place]   Heart: Regular rate and rhythm  Abdomen:  Soft, nontender,   Extremities:  No peripheral edema.  Neurologic:  Confused, able to follow simple commands  Skin: No lesions  Access: Left arm AVG    Basic Metabolic Panel: Recent Labs  Lab 10/02/19 0500 10/02/19 0930 10/03/19 0423 10/03/19 0423 10/04/19 0805 10/04/19 0805 10/05/19 1455 10/06/19 0613 10/07/19 1142  NA  --    < > 141  --  139  --  140 140 142  K  --    < > 4.0  --  3.9  --  3.5 3.6 3.2*  CL  --    < > 98  --  95*  --  96* 96* 96*  CO2  --    < > 31  --  27  --  27 26 27   GLUCOSE  --    < > 174*  --  142*  --  153* 175* 134*  BUN  --    < > 48*  --  91*  --  81* 108* 156*  CREATININE  --    < > 5.47*  --  7.02*  --  5.68* 6.92* 8.68*  CALCIUM  --    < > 8.8*   < > 9.0   < > 9.0 9.0 8.8*  MG 2.0  --  2.0  --   --   --   --   --   --   PHOS  --    < > 3.9  --  3.3  --  3.2 4.1 4.3   < > = values in this interval  not displayed.    Liver Function Tests: Recent Labs  Lab 10/03/19 0423 10/04/19 0805 10/05/19 1455 10/06/19 0613 10/07/19 1142  ALBUMIN 2.4* 2.7* 3.0* 3.2* 3.1*   No results for input(s): LIPASE, AMYLASE in the last 168 hours. No results for input(s): AMMONIA in the last 168 hours.  CBC: Recent Labs  Lab 10/04/19 0805 10/05/19 1455 10/06/19 0613 10/06/19 1332 10/07/19 1142  WBC 11.3* 13.1* 13.2* 15.3* 20.7*  HGB 7.9* 8.1* 9.0* 9.3* 9.8*  HCT 26.1* 26.0* 28.7* 29.9* 30.8*  MCV 96.0 92.2 92.6 93.4 93.3  PLT 307 320 379 379 429*    Cardiac Enzymes: No results for input(s): CKTOTAL, CKMB, CKMBINDEX, TROPONINI in the last 168 hours.  BNP: Invalid input(s): POCBNP  CBG:  Recent Labs  Lab 10/07/19 0018 10/07/19 0421 10/07/19 0838 10/07/19 1202 10/07/19 1603  GLUCAP 135* 120* 122* 142* 140*    Microbiology: Results for orders placed or performed during the hospital encounter of 09/27/19  SARS CORONAVIRUS 2 (TAT 6-24 HRS) Nasopharyngeal Nasopharyngeal Swab     Status: None   Collection Time: 09/28/19  1:40 AM   Specimen: Nasopharyngeal Swab  Result Value Ref Range Status   SARS Coronavirus 2 NEGATIVE NEGATIVE Final    Comment: (NOTE) SARS-CoV-2 target nucleic acids are NOT DETECTED. The SARS-CoV-2 RNA is generally detectable in upper and lower respiratory specimens during the acute phase of infection. Negative results do not preclude SARS-CoV-2 infection, do not rule out co-infections with other pathogens, and should not be used as the sole basis for treatment or other patient management decisions. Negative results must be combined with clinical observations, patient history, and epidemiological information. The expected result is Negative. Fact Sheet for Patients: SugarRoll.be Fact Sheet for Healthcare Providers: https://www.woods-mathews.com/ This test is not yet approved or cleared by the [place] FDA and  has  been authorized for detection and/or diagnosis of SARS-CoV-2 by FDA under an Emergency Use Authorization (EUA). This EUA will remain  in effect (meaning this test can be used) for the duration of the COVID-19 declaration under Section 56 4(b)(1) of the Act, 21 U.S.C. section 360bbb-3(b)(1), unless the authorization is terminated or revoked sooner. Performed at [facility], [place] [address] [place], [place] [address]   MRSA PCR Screening     Status: None   Collection Time: 09/28/19  3:08 AM   Specimen: Nasal Mucosa; Nasopharyngeal  Result Value Ref Range Status   MRSA by PCR NEGATIVE NEGATIVE Final    Comment:        The GeneXpert MRSA Assay (FDA approved for NASAL specimens only), is one component of a comprehensive MRSA colonization surveillance program. It is not intended to diagnose MRSA infection nor to guide or monitor treatment for MRSA infections. Performed at [facility], [place], [place] [address]   SARS CORONAVIRUS 2 (TAT 6-24 HRS) Nasopharyngeal Nasopharyngeal Swab     Status: None   Collection Time: 10/01/19 12:28 PM   Specimen: Nasopharyngeal Swab  Result Value Ref Range Status   SARS Coronavirus 2 NEGATIVE NEGATIVE Final    Comment: (NOTE) SARS-CoV-2 target nucleic acids are NOT DETECTED. The SARS-CoV-2 RNA is generally detectable in upper and lower respiratory specimens during the acute phase of infection. Negative results do not preclude SARS-CoV-2 infection, do not rule out co-infections with other pathogens, and should not be used as the sole basis for treatment or other patient management decisions. Negative results must be combined with clinical observations, patient history, and epidemiological information. The expected result is Negative. Fact Sheet for Patients: SugarRoll.be Fact Sheet for Healthcare Providers: https://www.woods-mathews.com/ This test is not yet approved or  cleared by the [place] FDA and  has been authorized for detection and/or diagnosis of SARS-CoV-2 by FDA under an Emergency Use Authorization (EUA). This EUA will remain  in effect (meaning this test can be used) for the duration of the COVID-19 declaration under Section 56 4(b)(1) of the Act, 21 U.S.C. section 360bbb-3(b)(1), unless the authorization is terminated or revoked sooner. Performed at [facility], [place] [address] [place], [place] [address]   SARS CORONAVIRUS 2 (TAT 6-24 HRS) Nasopharyngeal Nasopharyngeal Swab     Status: None   Collection Time: 10/05/19  3:11 PM   Specimen:  Nasopharyngeal Swab  Result Value Ref Range Status   SARS Coronavirus 2 NEGATIVE NEGATIVE Corrected    Comment: RESULT CALLED TO, READ BACK BY AND VERIFIED WITH: THIS IS A CORRECTED REPORT. PREVIOUSLY CALLED REPORT WAS POSITIVE. CALLED TO N [facility] RN 10/06/19 1755 JDW Performed at [place] [address], [place], [place] [address] CORRECTED ON 03/19 AT 2620: PREVIOUSLY REPORTED AS POSITIVE RESULT CALLED TO, READ BACK BY AND VERIFIED WITH: RN  TINA MICHAEL AT [place] ON 10/06/2019     Coagulation Studies: No results for input(s): LABPROT, INR in the last 72 hours.  Urinalysis: No results for input(s): COLORURINE, LABSPEC, PHURINE, GLUCOSEU, HGBUR, BILIRUBINUR, KETONESUR, PROTEINUR, UROBILINOGEN, NITRITE, LEUKOCYTESUR in the last 72 hours.  Invalid input(s): APPERANCEUR    Imaging: No results found.   Medications:    . B-complex with vitamin C  1 tablet Per Tube QHS  . Chlorhexidine Gluconate Cloth  6 each Topical Daily  . epoetin (EPOGEN/PROCRIT) injection  10,000 Units Intravenous Q T,Th,Sa-HD  . feeding supplement (NEPRO CARB STEADY)  1,000 mL Per Tube Q24H  . feeding supplement (PRO-STAT SUGAR FREE 64)  30 mL Per Tube BID  . free water  50 mL Per Tube 6 X Daily  . gabapentin  100 mg Per Tube Daily  . insulin aspart  0-6 Units Subcutaneous Q4H   . ipratropium  2 puff Inhalation Q6H  . lacosamide  100 mg Per Tube BID  . mouth rinse  15 mL Mouth Rinse BID  . methylPREDNISolone  4 mg Per Tube 3 x daily with food  . [START ON 10/08/2019] methylPREDNISolone  4 mg Per Tube 4X daily taper  . methylPREDNISolone  8 mg Per Tube Nightly  . methylPREDNISolone  8 mg Per Tube Nightly  . mometasone-formoterol  2 puff Inhalation BID  . pantoprazole sodium  40 mg Per Tube BID   acetaminophen (TYLENOL) oral liquid 160 mg/5 mL, acetaminophen, albuterol, diclofenac Sodium, liver oil-zinc oxide  Assessment/ Plan:  Ms. SARAHY CREEDON is a 79 y.o. black female with end stage renal disease on hemodialysis, hypertension, diabetes mellitus type II, diabetic neuropathy, seizure disorder, left hip repair, anemia of chronic kidney disease, secondary hyperparathyroidism. Acute GI bleeding [K92.2] Encounter for central line placement [Z45.2] PEG tube malfunction ([place]) [K94.23]   [facility] M[facility] LUE AVG 79.5kg    1.  ESRD on HD:  Dialysis today.  Concern over needle displacement if patient becomes agitated or moves her arm.  Nursing will keep a close watch We will transition to MWF shift starting Monday   2. Anemia of chronic kidney disease.  With GI bleed.  EGD from March 11 showed nonbleeding gastric ulcer Requiring PRBC transfusion on 3/10 and 3/11.  - Continue EPO with hemodialysis   - pantoprazole.  Lab Results  Component Value Date   HGB 9.8 (L) 10/07/2019     3.  Secondary hyperparathyroidism. Calcium and phosphorus at goal. Outpatient PTH was low at 72. Holding binders and activated vitamin D.    4.  Confusion, delirium, acute encephalopathy Likely metabolic Dialysis today Avoid narcotics Tylenol, Voltaren gel for right knee pain     LOS: 9 Keller Bounds 3/20/20214:33 PM

## 2019-10-07 NOTE — Progress Notes (Signed)
Report given to [facility] for patient.

## 2019-10-08 ENCOUNTER — Inpatient Hospital Stay: Payer: Medicare Other

## 2019-10-08 LAB — CBC
HCT: 31.6 % — ABNORMAL LOW (ref 36.0–46.0)
Hemoglobin: 10 g/dL — ABNORMAL LOW (ref 12.0–15.0)
MCH: 29.2 pg (ref 26.0–34.0)
MCHC: 31.6 g/dL (ref 30.0–36.0)
MCV: 92.1 fL (ref 80.0–100.0)
Platelets: 359 10*3/uL (ref 150–400)
RBC: 3.43 MIL/uL — ABNORMAL LOW (ref 3.87–5.11)
RDW: 16.6 % — ABNORMAL HIGH (ref 11.5–15.5)
WBC: 18.5 10*3/uL — ABNORMAL HIGH (ref 4.0–10.5)
nRBC: 0.9 % — ABNORMAL HIGH (ref 0.0–0.2)

## 2019-10-08 LAB — BASIC METABOLIC PANEL
Anion gap: 17 — ABNORMAL HIGH (ref 5–15)
BUN: 88 mg/dL — ABNORMAL HIGH (ref 8–23)
CO2: 26 mmol/L (ref 22–32)
Calcium: 8.9 mg/dL (ref 8.9–10.3)
Chloride: 98 mmol/L (ref 98–111)
Creatinine, Ser: 6.15 mg/dL — ABNORMAL HIGH (ref 0.44–1.00)
GFR calc Af Amer: 7 mL/min — ABNORMAL LOW (ref >60)
GFR calc non Af Amer: 6 mL/min — ABNORMAL LOW (ref >60)
Glucose, Bld: 171 mg/dL — ABNORMAL HIGH (ref 70–99)
Potassium: 3.3 mmol/L — ABNORMAL LOW (ref 3.5–5.1)
Sodium: 141 mmol/L (ref 135–145)

## 2019-10-08 LAB — GLUCOSE, CAPILLARY
Glucose-Capillary: 132 mg/dL — ABNORMAL HIGH (ref 70–99)
Glucose-Capillary: 138 mg/dL — ABNORMAL HIGH (ref 70–99)
Glucose-Capillary: 141 mg/dL — ABNORMAL HIGH (ref 70–99)
Glucose-Capillary: 158 mg/dL — ABNORMAL HIGH (ref 70–99)
Glucose-Capillary: 160 mg/dL — ABNORMAL HIGH (ref 70–99)
Glucose-Capillary: 168 mg/dL — ABNORMAL HIGH (ref 70–99)

## 2019-10-08 NOTE — Progress Notes (Signed)
[facility]  ROUNDING NOTE   Subjective:   Patient is much better today Alert and oriented Answering questions appropriately   Objective:  Vital signs in last 24 hours:  Temp:  [97.4 F (36.3 C)-98.2 F (36.8 C)] 97.8 F (36.6 C) (03/21 0850) Pulse Rate:  [81-98] 81 (03/21 0850) Resp:  [14-20] 20 (03/21 0850) BP: (133-206)/(51-82) 154/61 (03/21 0850) SpO2:  [97 %-99 %] 98 % (03/21 0850) Weight:  [76.2 kg] 76.2 kg (03/21 0500)  Weight change:  Filed Weights   10/05/19 0600 10/06/19 0649 10/08/19 0500  Weight: 76.2 kg 77.3 kg 76.2 kg    Intake/Output: I/O last 3 completed shifts: In: -  Out: 1500 [Other:1500]   Intake/Output this shift:  No intake/output data recorded.  Physical Exam: General: NAD, patient resting in bed   Head: Normocephalic, atraumatic. Moist oral mucosal membranes  Eyes: Anicteric,   Lungs:  Clear to auscultation, On supplemental O2 [place]   Heart: Regular rate and rhythm  Abdomen:  Soft, nontender,   Extremities:  No peripheral edema.  Neurologic:  Alert, able to follow simple commands  Skin: No lesions  Access: Left arm AVG    Basic Metabolic Panel: Recent Labs  Lab 10/02/19 0500 10/02/19 0930 10/03/19 0423 10/03/19 0423 10/04/19 0805 10/04/19 0805 10/05/19 1455 10/06/19 0613 10/07/19 1142  NA  --    < > 141  --  139  --  140 140 142  K  --    < > 4.0  --  3.9  --  3.5 3.6 3.2*  CL  --    < > 98  --  95*  --  96* 96* 96*  CO2  --    < > 31  --  27  --  27 26 27   GLUCOSE  --    < > 174*  --  142*  --  153* 175* 134*  BUN  --    < > 48*  --  91*  --  81* 108* 156*  CREATININE  --    < > 5.47*  --  7.02*  --  5.68* 6.92* 8.68*  CALCIUM  --    < > 8.8*   < > 9.0   < > 9.0 9.0 8.8*  MG 2.0  --  2.0  --   --   --   --   --   --   PHOS  --    < > 3.9  --  3.3  --  3.2 4.1 4.3   < > = values in this interval not displayed.    Liver Function Tests: Recent Labs  Lab 10/03/19 0423 10/04/19 0805 10/05/19 1455  10/06/19 0613 10/07/19 1142  ALBUMIN 2.4* 2.7* 3.0* 3.2* 3.1*   No results for input(s): LIPASE, AMYLASE in the last 168 hours. No results for input(s): AMMONIA in the last 168 hours.  CBC: Recent Labs  Lab 10/05/19 1455 10/06/19 0613 10/06/19 1332 10/07/19 1142 10/08/19 0759  WBC 13.1* 13.2* 15.3* 20.7* 18.5*  HGB 8.1* 9.0* 9.3* 9.8* 10.0*  HCT 26.0* 28.7* 29.9* 30.8* 31.6*  MCV 92.2 92.6 93.4 93.3 92.1  PLT 320 379 379 429* 359    Cardiac Enzymes: No results for input(s): CKTOTAL, CKMB, CKMBINDEX, TROPONINI in the last 168 hours.  BNP: Invalid input(s): POCBNP  CBG: Recent Labs  Lab 10/07/19 1603 10/07/19 2208 10/08/19 0026 10/08/19 0407 10/08/19 0756  GLUCAP 140* 133* 158* 141* 168*    Microbiology: Results for orders  placed or performed during the hospital encounter of 09/27/19  SARS CORONAVIRUS 2 (TAT 6-24 HRS) Nasopharyngeal Nasopharyngeal Swab     Status: None   Collection Time: 09/28/19  1:40 AM   Specimen: Nasopharyngeal Swab  Result Value Ref Range Status   SARS Coronavirus 2 NEGATIVE NEGATIVE Final    Comment: (NOTE) SARS-CoV-2 target nucleic acids are NOT DETECTED. The SARS-CoV-2 RNA is generally detectable in upper and lower respiratory specimens during the acute phase of infection. Negative results do not preclude SARS-CoV-2 infection, do not rule out co-infections with other pathogens, and should not be used as the sole basis for treatment or other patient management decisions. Negative results must be combined with clinical observations, patient history, and epidemiological information. The expected result is Negative. Fact Sheet for Patients: SugarRoll.be Fact Sheet for Healthcare Providers: https://www.woods-mathews.com/ This test is not yet approved or cleared by the [place] FDA and  has been authorized for detection and/or diagnosis of SARS-CoV-2 by FDA under an Emergency Use Authorization  (EUA). This EUA will remain  in effect (meaning this test can be used) for the duration of the COVID-19 declaration under Section 56 4(b)(1) of the Act, 21 U.S.C. section 360bbb-3(b)(1), unless the authorization is terminated or revoked sooner. Performed at [facility], [place] [address] [place], [place] [address]   MRSA PCR Screening     Status: None   Collection Time: 09/28/19  3:08 AM   Specimen: Nasal Mucosa; Nasopharyngeal  Result Value Ref Range Status   MRSA by PCR NEGATIVE NEGATIVE Final    Comment:        The GeneXpert MRSA Assay (FDA approved for NASAL specimens only), is one component of a comprehensive MRSA colonization surveillance program. It is not intended to diagnose MRSA infection nor to guide or monitor treatment for MRSA infections. Performed at [facility], [place], [place] [address]   SARS CORONAVIRUS 2 (TAT 6-24 HRS) Nasopharyngeal Nasopharyngeal Swab     Status: None   Collection Time: 10/01/19 12:28 PM   Specimen: Nasopharyngeal Swab  Result Value Ref Range Status   SARS Coronavirus 2 NEGATIVE NEGATIVE Final    Comment: (NOTE) SARS-CoV-2 target nucleic acids are NOT DETECTED. The SARS-CoV-2 RNA is generally detectable in upper and lower respiratory specimens during the acute phase of infection. Negative results do not preclude SARS-CoV-2 infection, do not rule out co-infections with other pathogens, and should not be used as the sole basis for treatment or other patient management decisions. Negative results must be combined with clinical observations, patient history, and epidemiological information. The expected result is Negative. Fact Sheet for Patients: SugarRoll.be Fact Sheet for Healthcare Providers: https://www.woods-mathews.com/ This test is not yet approved or cleared by the [place] FDA and  has been authorized for detection and/or diagnosis of SARS-CoV-2  by FDA under an Emergency Use Authorization (EUA). This EUA will remain  in effect (meaning this test can be used) for the duration of the COVID-19 declaration under Section 56 4(b)(1) of the Act, 21 U.S.C. section 360bbb-3(b)(1), unless the authorization is terminated or revoked sooner. Performed at [facility], [place] [address] [place], [place] [address]   SARS CORONAVIRUS 2 (TAT 6-24 HRS) Nasopharyngeal Nasopharyngeal Swab     Status: None   Collection Time: 10/05/19  3:11 PM   Specimen: Nasopharyngeal Swab  Result Value Ref Range Status   SARS Coronavirus 2 NEGATIVE NEGATIVE Corrected    Comment: RESULT CALLED TO, READ BACK BY AND VERIFIED  WITH: THIS IS A CORRECTED REPORT. PREVIOUSLY CALLED REPORT WAS POSITIVE. CALLED TO N [facility] RN 10/06/19 1755 JDW Performed at [place] [address], [place], [place] [address] CORRECTED ON 03/19 AT 9892: PREVIOUSLY REPORTED AS POSITIVE RESULT CALLED TO, READ BACK BY AND VERIFIED WITH: RN  TINA MICHAEL AT [place] ON 10/06/2019     Coagulation Studies: No results for input(s): LABPROT, INR in the last 72 hours.  Urinalysis: No results for input(s): COLORURINE, LABSPEC, PHURINE, GLUCOSEU, HGBUR, BILIRUBINUR, KETONESUR, PROTEINUR, UROBILINOGEN, NITRITE, LEUKOCYTESUR in the last 72 hours.  Invalid input(s): APPERANCEUR    Imaging: No results found.   Medications:    . B-complex with vitamin C  1 tablet Per Tube QHS  . Chlorhexidine Gluconate Cloth  6 each Topical Daily  . epoetin (EPOGEN/PROCRIT) injection  10,000 Units Intravenous Q T,Th,Sa-HD  . feeding supplement (NEPRO CARB STEADY)  1,000 mL Per Tube Q24H  . feeding supplement (PRO-STAT SUGAR FREE 64)  30 mL Per Tube BID  . free water  50 mL Per Tube 6 X Daily  . gabapentin  100 mg Per Tube Daily  . insulin aspart  0-6 Units Subcutaneous Q4H  . ipratropium  2 puff Inhalation Q6H  . lacosamide  100 mg Per Tube BID  . mouth rinse  15 mL Mouth  Rinse BID  . methylPREDNISolone  4 mg Per Tube 4X daily taper  . mometasone-formoterol  2 puff Inhalation BID  . pantoprazole sodium  40 mg Per Tube BID   acetaminophen (TYLENOL) oral liquid 160 mg/5 mL, acetaminophen, albuterol, diclofenac Sodium, liver oil-zinc oxide  Assessment/ Plan:  Ms. CAPRICIA SERDA is a 79 y.o. black female with end stage renal disease on hemodialysis, hypertension, diabetes mellitus type II, diabetic neuropathy, seizure disorder, left hip repair, anemia of chronic kidney disease, secondary hyperparathyroidism. Acute GI bleeding [K92.2] Encounter for central line placement [Z45.2] PEG tube malfunction ([place]) [K94.23]   [facility] M[facility] LUE AVG 72 kg  FDODE 11/20/2016   1.  ESRD on HD:  Mental status is much improved.  Patient tolerated dialysis well yesterday  We will transition to MWF shift starting Monday   2. Anemia of chronic kidney disease.  With GI bleed.  EGD from March 11 showed nonbleeding gastric ulcer Requiring PRBC transfusion on 3/10 and 3/11.  - Continue EPO with hemodialysis   - pantoprazole.  Lab Results  Component Value Date   HGB 10.0 (L) 10/08/2019     3.  Secondary hyperparathyroidism. Calcium and phosphorus at goal. Outpatient PTH was low at 72. Holding binders and activated vitamin D.    4.  Confusion, delirium, acute encephalopathy Likely metabolic Improved today  avoid narcotics Tylenol, Voltaren gel for right knee pain-patient states it has helped     LOS: 10 Rakia Frayne 3/21/20219:38 AM

## 2019-10-08 NOTE — Progress Notes (Signed)
Physical Therapy Treatment Patient Details Name: Barbara [name] MRN: [id number] DOB: 07/09/1941 Today's Date: 10/08/2019    History of Present Illness 79 yo female with ESRD on HD admitted with acute GI bleed s/p upper endoscopy requiring transfer to the stepdown unit with worsening acute on chronic hypoxic respiratory failure secondary to acute on chronic CHF exacerbation and AECOPD requiring HFNC (transfered to CCU 3/15).  She is s/p L total hip (posterior approach) mid February    PT Comments    Pt more awake and more able to participate with PT today, however still remains very limited functionally.  She did show improved strength/execution with exercises and appeared to have more strength with these than translated to functional ability during standing attempts.  Pt fatigued quickly with most acts and did need rest breaks but O2 and HR remained relatively stable t/o the session and she consistently showed good effort with all tasks.  Multiple standing attempts with very little tolerance to WBing/upright.  Pt could not straighten L knee in standing and even with max assist needed to sit down within ~15 seconds each time.  Pt was walking community distances using cane ~1 months ago, is motivated to work hard at rehab and get back to function as quickly as possible.   Follow Up Recommendations  SNF     Equipment Recommendations  Rolling walker with 5" wheels;3in1 (PT);Wheelchair cushion (measurements PT);Wheelchair (measurements PT)    Recommendations for Other Services       Precautions / Restrictions Precautions Precautions: Posterior Hip;Fall Restrictions Weight Bearing Restrictions: Yes LLE Weight Bearing: Weight bearing as tolerated    Mobility  Bed Mobility Overal bed mobility: Needs Assistance Bed Mobility: Supine to Sit;Sit to Supine Rolling: Min assist   Supine to sit: Max assist;Mod assist Sit to supine: Max assist   General bed mobility comments: Pt able to assist to  getting to EOB better today, good overall effort holding PT's hand to help pull up   Transfers Overall transfer level: Needs assistance Equipment used: Rolling walker (2 wheeled);None Transfers: Sit to/from Stand Sit to Stand: Max assist         General transfer comment: 3x sit to standing with heavy assist each time.  Pt struggled to keep L knee straight, buckling with attempts at [facility].  Pt unable to maintain standing, even with max assist, for more than ~15 seconds.  Great effort but extremely weak functionally with standing.  Ambulation/Gait             General Gait Details: unsafe/unable   Stairs             Wheelchair Mobility    Modified Rankin (Stroke Patients Only)       Balance Overall balance assessment: Needs assistance Sitting-balance support: Bilateral upper extremity supported;Feet supported Sitting balance-Leahy Scale: Fair Sitting balance - Comments: With CGA she was able to maintain sitting w/o LOBs or lean   Standing balance support: Bilateral upper extremity supported Standing balance-Leahy Scale: Zero Standing balance comment: even with heavy UE use on walker and assist from PT, pt's knee buckling and unable to sustain standing                            Cognition Arousal/Alertness: Awake/alert Behavior During Therapy: WFL for tasks assessed/performed Overall Cognitive Status: Within Functional Limits for tasks assessed  General Comments: Pt more alert and awake than recent PT session, still some mild confusion      Exercises Total Joint Exercises Ankle Circles/Pumps: AROM;10 reps Quad Sets: Strengthening;15 reps Short Arc Quad: AROM;10 reps(unable to actively achieve TKE) Heel Slides: AROM;15 reps(resisted leg extensions) Hip ABduction/ADduction: AROM;Strengthening;15 reps(able to push against light resistance on L) Straight Leg Raises: PROM;AROM;10 reps(unable to perform  AROM SLRs on L)    General Comments        Pertinent Vitals/Pain Pain Assessment: No/denies pain    Home Living                      Prior Function            PT Goals (current goals can now be found in the care plan section) Progress towards PT goals: Progressing toward goals    Frequency    Min 2X/week      PT Plan Current plan remains appropriate    Co-evaluation              AM-PAC PT "6 Clicks" Mobility   Outcome Measure  Help needed turning from your back to your side while in a flat bed without using bedrails?: A Little Help needed moving from lying on your back to sitting on the side of a flat bed without using bedrails?: A Lot Help needed moving to and from a bed to a chair (including a wheelchair)?: Total Help needed standing up from a chair using your arms (e.g., wheelchair or bedside chair)?: Total Help needed to walk in hospital room?: Total Help needed climbing 3-5 steps with a railing? : Total 6 Click Score: 9    End of Session Equipment Utilized During Treatment: Gait belt Activity Tolerance: Patient limited by fatigue;Patient tolerated treatment well Patient left: with bed alarm set;with call bell/phone within reach Nurse Communication: Mobility status PT Visit Diagnosis: Muscle weakness (generalized) (M62.81);Difficulty in walking, not elsewhere classified (R26.2) Pain - Right/Left: Left Pain - part of body: Hip     Time: 3254-9826 PT Time Calculation (min) (ACUTE ONLY): 55 min  Charges:  $Therapeutic Exercise: 23-37 mins $Therapeutic Activity: 23-37 mins                     Kreg Shropshire, DPT 10/08/2019, 5:28 PM

## 2019-10-08 NOTE — Progress Notes (Signed)
PROGRESS NOTE    LINNELL SWORDS  XBD:[id number] DOB: March 14, 1941 DOA: 09/27/2019 PCP: [name], MD   Brief Narrative: Barbara [name] is a 79 y.o. female with medical history significant for ESRD on HD MWF, hyperlipidemia, type 2 diabetes, COPD, asthma, GERD, bladder cancer, and iron deficiency anemia who presents for concerns of acute GI bleed and anemia. SP EGD and replacement of the G-tube. Pt came with anemia and hg of 4.8  And received 2 untis of PRBC in ed. She was also recently  hospitalized from 02/16 to 03/7 following a left total hip arthroplasty  (83/[id number]) complicated by toxic metabolic encephalopathy due to severe metabolic acidosis, and seizure activity (started on vimpat) requiring mechanical intubation/CRRTand PEG tube placement due to dysphagia then discharged to [facility].   She presented to [facility] ER on 03/10 via EMS from [facility] with coffee-ground emesis onset 2 days prior to presentation.  Lab results at the facility revealed hgb of 5 prompting ER visit.  It was also reported the pt pulled out her PEG tube.  In the ER lab results revealed Na+ 133, glucose 121, BUN 39, creatinine 4.37, albumin 2.5, lipase 63, creatinine 4.37, wbc 19.7, hgb 4.8 and COVID-19 negative.  ER physician also reported the pt had a grossly melanotic stool. Pt received 2 unit of pRBC's, protonix gtt started, and GI consulted.   She has egd on 09/28/19 which showed non-bleeding gastric ulcer.  Then patient developed respiratory failure with bilateral wheezes currently being transferred to stepdown unit. I suspect respiratory failure from volume overload from transfusion.  Pt transferred to floor on 3/17 and has been stable and Currently further plan is continue treat respiratory failure and monitor for stability [facility]. Pt is table and needs authorization for discharge to snf, spouse st bedside today and states daughter and himself cant take care of her. Per  requirement pt needs covid-19 pcr pending for d/c in am to a SNF.   Pt seen today is agitated and refuses care this si new behavior to me since a day ago, pt was almost discharged home  Days before yesterday however had covid-19 test and had to [facility] for remdesivir therapy.  3/20 I have d/w neurologist dr. Doy Mince to consult and she is also very familiar with patient and has seen her with plan For ct head and labs. If pt' is stable we will d/c in am If bed available at facility.  3/21 Pt seen today she is alert awake and seems her baseline or as same as I have been seeing her for past few days. She is feeling down husband at bedside. She has soiled the bed and  Loose stool has been collected but does not meet c.diff testing criteria. I suspect it is noninfectious. Pt is on her steroid taper which is also raising her wbc count pt is afebrile and otherwise doing well. husband is asking if pt's feeding tube can be removed because ortho said it would out in 2 weeks. We will repeat speech eval and nutrition consult for intermittent tube feed instead of continuous.     Assessment & Plan:   Principal Problem:   Acute GI bleeding Active Problems:   Leukocytosis   ESRD (end stage renal disease) (HCC)   Type II diabetes mellitus with renal manifestations (HCC)   Sacral ulcer (HCC)   Acute respiratory failure with hypoxia (HCC)   Chronic diastolic CHF (congestive heart failure) ([place])   Seizure ([place])  PEG tube malfunction (HCC)   Acute GI bleed Hemoglobin of 10 and transfuse during dialysis. Cont iv ppi. Attribute to combination of GI bleed and iron def anemia and ACD due to her CKD,.    Acute blood loss anemia-resolved Anemia of chronic kidney disease Presented with GI bleed.  Hemoglobin dropped down to 8.1  pt on epo and transfuse as needed.   PEG tube dependency with dysphagia Placed on 09/20/2019 due to dysphagia Appreciate GI assistance for replacement of the G-tube. GI recommended to  resume diet. Currently tolerating well.   Acute hypoxic respiratory failure/Acute on chronic systolic CHF/Acute bronchitis: Resolved . Pt is on Room Air with normal oxygen saturations.    Leukocytosis/concern for cellulitis This appears to have been chronic for the past week.  No signs of fever.  Continue IV antibiotics.   ESRD on HD MWF Creatinine is stable Need to monitor for any fluid overload with blood transfusion Appreciate nephrology assistance. Patient will require to sit in the chair.   Chronic diastolic CHF:  Hemodialysis for  Volume management   Type 2 diabetes controlled. diet-controlled  Currently on sliding scale insulin. Also on Nepro for tube feeding. accuchecks ac/hs   Hx of seizure Continue home regimen per tube.   Stage 1 sacral and buttock ulcers  Wound care per RN    Dysphagia. Patient is significantly awake and alert. Speech therapy consulted for assessment for the diet. Per speech therapy patient was not eating enough likely from not being hungry while she was on tube feeding. Given lethargy currently holding off on further work-up.  Appreciate speech therapy recommendation.   Acute encephalopathy-  metabolic  From medication.  Received fentanyl early this morning. Continues to report severe right leg cramps.  And given Robaxin. Continue to monitor for now.  Covid-19: Pt was found to be covid-19 positive and started on remdesivir, however later today received report from nurse that  Results are erroneous and due to lack of iv access also iv t/t d/c and changed to po needed meds only.   DVT prophylaxis: SCD's Code Status: Full Code Family Communication: Spouse at bedside [facility] Plan: [facility], Insurance authorization for d/c to facilty in am and covid-19 pcr test.   Consultants:   Haze Boyden  Speech therapy.  Physical therapy.  Procedures: Dialysis t/th/sat.  Subjective: Pt seen today for f/u and is stable spouse  wants her to go to snf.  Pt denies any complaints of chest pain or sob.   Objective: Vitals:   10/07/19 2049 10/08/19 0025 10/08/19 0500 10/08/19 0850  BP: (!) 145/75 133/82  (!) 154/61  Pulse: 82 90  81  Resp:    20  Temp: (!) 97.4 F (36.3 C) 98.1 F (36.7 C)  97.8 F (36.6 C)  TempSrc: Oral Oral  Axillary  SpO2: 99% 97%  98%  Weight:   76.2 kg   Height:        Intake/Output Summary (Last 24 hours) at 10/08/2019 1415 Last data filed at 10/07/2019 1756 Gross per 24 hour  Intake --  Output 1500 ml  Net -1500 ml   Filed Weights   10/05/19 0600 10/06/19 0649 10/08/19 0500  Weight: 76.2 kg 77.3 kg 76.2 kg    Examination: Blood pressure (!) 154/61, pulse 81, temperature 97.8 F (36.6 C), temperature source Axillary, resp. rate 20, height 5' 6"  (1.676 m), weight 76.2 kg, SpO2 98 %. General exam: Appears calm and comfortable  Respiratory system: Clear to auscultation. Respiratory  effort normal. Cardiovascular system: S1 & S2 heard, RRR. No JVD, murmurs, rubs, gallops or clicks. No pedal edema. Gastrointestinal system: Abdomen is nondistended, soft and nontender. No organomegaly or masses felt. Normal bowel sounds heard. Central nervous system: Alert and oriented. No focal neurological deficits. Extremities: Symmetric 5 x 5 power. Skin: No rashes, lesions or ulcers Psychiatry: Judgement and insight appear normal. Mood & affect appropriate.   Data Reviewed:  CBC: Recent Labs  Lab 10/05/19 1455 10/06/19 0613 10/06/19 1332 10/07/19 1142 10/08/19 0759  WBC 13.1* 13.2* 15.3* 20.7* 18.5*  HGB 8.1* 9.0* 9.3* 9.8* 10.0*  HCT 26.0* 28.7* 29.9* 30.8* 31.6*  MCV 92.2 92.6 93.4 93.3 92.1  PLT 320 379 379 429* 144   Basic Metabolic Panel: Recent Labs  Lab 10/02/19 0500 10/02/19 0930 10/03/19 0423 10/04/19 0805 10/05/19 1455 10/06/19 0613 10/07/19 1142  NA  --    < > 141 139 140 140 142  K  --    < > 4.0 3.9 3.5 3.6 3.2*  CL  --    < > 98 95* 96* 96* 96*  CO2  --    <  > 31 27 27 26 27   GLUCOSE  --    < > 174* 142* 153* 175* 134*  BUN  --    < > 48* 91* 81* 108* 156*  CREATININE  --    < > 5.47* 7.02* 5.68* 6.92* 8.68*  CALCIUM  --    < > 8.8* 9.0 9.0 9.0 8.8*  MG 2.0  --  2.0  --   --   --   --   PHOS  --    < > 3.9 3.3 3.2 4.1 4.3   < > = values in this interval not displayed.   GFR: Estimated Creatinine Clearance: 5.5 mL/min (A) (by C-G formula based on SCr of 8.68 mg/dL (H)). Liver Function Tests: Recent Labs  Lab 10/03/19 0423 10/04/19 0805 10/05/19 1455 10/06/19 0613 10/07/19 1142  ALBUMIN 2.4* 2.7* 3.0* 3.2* 3.1*   No results for input(s): LIPASE, AMYLASE in the last 168 hours. No results for input(s): AMMONIA in the last 168 hours. Coagulation Profile: No results for input(s): INR, PROTIME in the last 168 hours. Cardiac Enzymes: No results for input(s): CKTOTAL, CKMB, CKMBINDEX, TROPONINI in the last 168 hours. BNP (last 3 results) No results for input(s): PROBNP in the last 8760 hours. HbA1C: No results for input(s): HGBA1C in the last 72 hours. CBG: Recent Labs  Lab 10/07/19 2208 10/08/19 0026 10/08/19 0407 10/08/19 0756 10/08/19 1136  GLUCAP 133* 158* 141* 168* 160*    Recent Results (from the past 240 hour(s))  SARS CORONAVIRUS 2 (TAT 6-24 HRS) Nasopharyngeal Nasopharyngeal Swab     Status: None   Collection Time: 10/01/19 12:28 PM   Specimen: Nasopharyngeal Swab  Result Value Ref Range Status   SARS Coronavirus 2 NEGATIVE NEGATIVE Final    Comment: (NOTE) SARS-CoV-2 target nucleic acids are NOT DETECTED. The SARS-CoV-2 RNA is generally detectable in upper and lower respiratory specimens during the acute phase of infection. Negative results do not preclude SARS-CoV-2 infection, do not rule out co-infections with other pathogens, and should not be used as the sole basis for treatment or other patient management decisions. Negative results must be combined with clinical observations, patient history, and  epidemiological information. The expected result is Negative. Fact Sheet for Patients: SugarRoll.be Fact Sheet for Healthcare Providers: https://www.woods-mathews.com/ This test is not yet approved or cleared by the [place] FDA  and  has been authorized for detection and/or diagnosis of SARS-CoV-2 by FDA under an Emergency Use Authorization (EUA). This EUA will remain  in effect (meaning this test can be used) for the duration of the COVID-19 declaration under Section 56 4(b)(1) of the Act, 21 U.S.C. section 360bbb-3(b)(1), unless the authorization is terminated or revoked sooner. Performed at [facility], [place] [address] [place], [place] [address]   SARS CORONAVIRUS 2 (TAT 6-24 HRS) Nasopharyngeal Nasopharyngeal Swab     Status: None   Collection Time: 10/05/19  3:11 PM   Specimen: Nasopharyngeal Swab  Result Value Ref Range Status   SARS Coronavirus 2 NEGATIVE NEGATIVE Corrected    Comment: RESULT CALLED TO, READ BACK BY AND VERIFIED WITH: THIS IS A CORRECTED REPORT. PREVIOUSLY CALLED REPORT WAS POSITIVE. CALLED TO [facility] RN 10/06/19 1755 JDW Performed at [facility] [address], [place], [place] [address] CORRECTED ON 03/19 AT 6256: PREVIOUSLY REPORTED AS POSITIVE RESULT CALLED TO, READ BACK BY AND VERIFIED WITH: RN  TINA MICHAEL AT [facility] ON 10/06/2019       Radiology Studies: No results found.  Scheduled Meds: . B-complex with vitamin C  1 tablet Per Tube QHS  . Chlorhexidine Gluconate Cloth  6 each Topical Daily  . epoetin (EPOGEN/PROCRIT) injection  10,000 Units Intravenous Q T,Th,Sa-HD  . feeding supplement (NEPRO CARB STEADY)  1,000 mL Per Tube Q24H  . feeding supplement (PRO-STAT SUGAR FREE 64)  30 mL Per Tube BID  . free water  50 mL Per Tube 6 X Daily  . gabapentin  100 mg Per Tube Daily  . insulin aspart  0-6 Units Subcutaneous Q4H  . ipratropium  2 puff Inhalation Q6H  .  lacosamide  100 mg Per Tube BID  . mouth rinse  15 mL Mouth Rinse BID  . methylPREDNISolone  4 mg Per Tube 4X daily taper  . mometasone-formoterol  2 puff Inhalation BID  . pantoprazole sodium  40 mg Per Tube BID     LOS: 10 days    Para Skeans, DO [facility] If 7PM-7AM, please contact night-coverage [email or web address] Password [email or web address] 10/08/2019, 2:15 PM

## 2019-10-08 NOTE — Progress Notes (Signed)
Transport for CT notified this RN patient crying stating "I don't want to go, please don't take me." This RN spoke with patient who continued to state "I don't want to have that done, please just leave me here." RN explained scan and patient again stated "I don't want to go have that done." Dr. Posey Pronto notified. Imaging notified.

## 2019-10-09 LAB — RENAL FUNCTION PANEL
Albumin: 3.1 g/dL — ABNORMAL LOW (ref 3.5–5.0)
Anion gap: 16 — ABNORMAL HIGH (ref 5–15)
BUN: 115 mg/dL — ABNORMAL HIGH (ref 8–23)
CO2: 27 mmol/L (ref 22–32)
Calcium: 8.8 mg/dL — ABNORMAL LOW (ref 8.9–10.3)
Chloride: 99 mmol/L (ref 98–111)
Creatinine, Ser: 7.25 mg/dL — ABNORMAL HIGH (ref 0.44–1.00)
GFR calc Af Amer: 6 mL/min — ABNORMAL LOW (ref >60)
GFR calc non Af Amer: 5 mL/min — ABNORMAL LOW (ref >60)
Glucose, Bld: 139 mg/dL — ABNORMAL HIGH (ref 70–99)
Phosphorus: 4.7 mg/dL — ABNORMAL HIGH (ref 2.5–4.6)
Potassium: 3.4 mmol/L — ABNORMAL LOW (ref 3.5–5.1)
Sodium: 142 mmol/L (ref 135–145)

## 2019-10-09 LAB — GLUCOSE, CAPILLARY
Glucose-Capillary: 111 mg/dL — ABNORMAL HIGH (ref 70–99)
Glucose-Capillary: 119 mg/dL — ABNORMAL HIGH (ref 70–99)
Glucose-Capillary: 128 mg/dL — ABNORMAL HIGH (ref 70–99)
Glucose-Capillary: 132 mg/dL — ABNORMAL HIGH (ref 70–99)
Glucose-Capillary: 133 mg/dL — ABNORMAL HIGH (ref 70–99)

## 2019-10-09 MED ORDER — JUVEN PO PACK
1.0000 | PACK | Freq: Two times a day (BID) | ORAL | Status: DC
  Administered 2019-10-09: 13:00:00 1

## 2019-10-09 MED ORDER — SODIUM CHLORIDE 0.9 % IV SOLN
INTRAVENOUS | Status: DC | PRN
  Administered 2019-10-09: 500 mL via INTRAVENOUS

## 2019-10-09 MED ORDER — POTASSIUM CHLORIDE 10 MEQ/100ML IV SOLN
10.0000 meq | Freq: Once | INTRAVENOUS | Status: AC
  Administered 2019-10-09: 23:00:00 10 meq via INTRAVENOUS
  Filled 2019-10-09: qty 100

## 2019-10-09 MED ORDER — EPOETIN ALFA 4000 UNIT/ML IJ SOLN
4000.0000 [IU] | INTRAMUSCULAR | Status: DC
  Filled 2019-10-09: qty 1

## 2019-10-09 NOTE — Progress Notes (Signed)
PROGRESS NOTE    Barbara [name]  AYT:[id number] DOB: 1941/05/27 DOA: 09/27/2019 PCP: [name], MD   Brief Narrative: Barbara [name] is a 79 y.o. female with medical history significant for ESRD on HD MWF, hyperlipidemia, type 2 diabetes, COPD, asthma, GERD, bladder cancer, and iron deficiency anemia who presents for concerns of acute GI bleed and anemia. SP EGD and replacement of the G-tube. Pt came with anemia and hg of 4.8  And received 2 untis of PRBC in ed. She was also recently  hospitalized from 02/16 to 03/7 following a left total hip arthroplasty  (35/57/3220) complicated by toxic metabolic encephalopathy due to severe metabolic acidosis, and seizure activity (started on vimpat) requiring mechanical intubation/CRRTand PEG tube placement due to dysphagia then discharged to [facility].   She presented to [facility] ER on 03/10 via EMS from [facility] with coffee-ground emesis onset 2 days prior to presentation.  Lab results at the facility revealed hgb of 5 prompting ER visit.  It was also reported the pt pulled out her PEG tube.  In the ER lab results revealed Na+ 133, glucose 121, BUN 39, creatinine 4.37, albumin 2.5, lipase 63, creatinine 4.37, wbc 19.7, hgb 4.8 and COVID-19 negative.  ER physician also reported the pt had a grossly melanotic stool. Pt received 2 unit of pRBC's, protonix gtt started, and GI consulted.   She has egd on 09/28/19 which showed non-bleeding gastric ulcer.  Then patient developed respiratory failure with bilateral wheezes currently being transferred to stepdown unit. I suspect respiratory failure from volume overload from transfusion.  Pt transferred to floor on 3/17 and has been stable and Currently further plan is continue treat respiratory failure and monitor for stability [facility]. Pt is table and needs authorization for discharge to snf, spouse st bedside today and states daughter and himself cant take care of her. Per  requirement pt needs covid-19 pcr pending for d/c in am to a SNF.   Pt seen today is agitated and refuses care this si new behavior to me since a day ago, pt was almost discharged home  Days before yesterday however had covid-19 test and had to [facility] for remdesivir therapy.  3/20 I have d/w neurologist dr. Doy Mince to consult and she is also very familiar with patient and has seen her with plan For ct head and labs. If pt' is stable we will d/c in am If bed available at facility.  3/21 Pt seen today she is alert awake and seems her baseline or as same as I have been seeing her for past few days. She is feeling down husband at bedside. She has soiled the bed and  Loose stool has been collected but does not meet c.diff testing criteria. I suspect it is noninfectious. Pt is on her steroid taper which is also raising her wbc count pt is afebrile and otherwise doing well. husband is asking if pt's feeding tube can be removed because ortho said it would out in 2 weeks. We will repeat speech eval and nutrition consult for intermittent tube feed instead of continuous.  3/22 Patient seen today she is alert awake oriented, case has been discussed with nephrologist Dr. Candiss Norse and patient is not a for discharge she will need ongoing dialysis.  Labs today show potassium of 3.4 replaced.  Patient is sad that she cannot go home.  Assessment & Plan:   Principal Problem:   Acute GI bleeding Active Problems:   Leukocytosis  ESRD (end stage renal disease) ([place])   Type II diabetes mellitus with renal manifestations ([place])   Sacral ulcer (HCC)   Acute respiratory failure with hypoxia (HCC)   Chronic diastolic CHF (congestive heart failure) (HCC)   Seizure (HCC)   PEG tube malfunction (HCC)   Acute GI bleed Hemoglobin of 10 and and stable.  Cont iv ppi. Attribute to combination of GI bleed and iron def anemia and ACD due to her CKD,.    Acute blood loss anemia-resolved Anemia of chronic kidney disease  Presented with GI bleed.  Hemoglobin dropped down to 8.1  pt on epo and transfuse as needed.   PEG tube dependency with dysphagia Placed on 09/20/2019 due to dysphagia Appreciate GI assistance for replacement of the G-tube. GI recommended to resume diet. Currently tolerating well. Nutrition is consulted for alterations in frequency.   Acute hypoxic respiratory failure/Acute on chronic systolic CHF/Acute bronchitis: Resolved . Pt is on Room Air with normal oxygen saturations.    Leukocytosis/concern for cellulitis This appears to have been chronic for the past week.  No signs of fever.  Continue IV antibiotics.   ESRD on HD MWF Creatinine is stable Need to monitor for any fluid overload with blood transfusion Appreciate nephrology assistance. Patient will require to sit in the chair.   Chronic diastolic CHF:  Hemodialysis for  Volume management   Type 2 diabetes controlled. diet-controlled  Currently on sliding scale insulin. Also on Nepro for tube feeding. accuchecks ac/hs   Hx of seizure Continue home regimen per tube.   Stage 1 sacral and buttock ulcers  Wound care per RN    Dysphagia. Patient is significantly awake and alert. Speech therapy consulted for assessment for the diet. Per speech therapy patient was not eating enough likely from not being hungry while she was on tube feeding. Given lethargy currently holding off on further work-up.  Appreciate speech therapy recommendation.   Acute encephalopathy-  metabolic  From medication.  Received fentanyl early this morning. Continues to report severe right leg cramps.  And given Robaxin. Continue to monitor for now.  Covid-19-error Pt was found to be covid-19 positive and started on remdesivir, however later today received report from nurse that  Results are erroneous and due to lack of iv access also iv t/t d/c and changed to po needed meds only.   Diarrhea: She had one stool today.  DVT prophylaxis: SCD's  Code Status: Full Code Family Communication: Spouse at bedside [facility], Consultants:   Gastro  nephro  Speech therapy.  Physical therapy.  Procedures: Dialysis t/th/sat.  Subjective: Pt seen today for f/u and is stable spouse wants her to go to snf.  Pt denies any complaints of chest pain or sob.   Objective: Vitals:   10/09/19 1055 10/09/19 1100 10/09/19 1119 10/09/19 1126  BP: (!) 164/68 (!) 150/56 (!) 164/80 (!) 153/65  Pulse:  86 87 75  Resp: 16 18 16 17   Temp:      TempSrc:      SpO2: 97% 96% 98% 97%  Weight:      Height:       No intake or output data in the 24 hours ending 10/09/19 1850 Filed Weights   10/06/19 0649 10/08/19 0500 10/09/19 0456  Weight: 77.3 kg 76.2 kg 75 kg    Examination: Blood pressure (!) 153/65, pulse 75, temperature 98.5 F (36.9 C), temperature source Oral, resp. rate 17, height 5' 6"  (1.676 m), weight 75 kg, SpO2  97 %. General exam: Appears calm and comfortable  Respiratory system: Clear to auscultation. Respiratory effort normal. Cardiovascular system: S1 & S2 heard, RRR. No JVD, murmurs, rubs, gallops or clicks. No pedal edema. Gastrointestinal system: Abdomen is nondistended, soft and nontender. No organomegaly or masses felt. Normal bowel sounds heard. Central nervous system: Alert and oriented. No focal neurological deficits. Extremities: Symmetric 5 x 5 power. Skin: No rashes, lesions or ulcers Psychiatry: Judgement and insight appear normal. Mood & affect appropriate.   Data Reviewed:  CBC: Recent Labs  Lab 10/05/19 1455 10/06/19 0613 10/06/19 1332 10/07/19 1142 10/08/19 0759  WBC 13.1* 13.2* 15.3* 20.7* 18.5*  HGB 8.1* 9.0* 9.3* 9.8* 10.0*  HCT 26.0* 28.7* 29.9* 30.8* 31.6*  MCV 92.2 92.6 93.4 93.3 92.1  PLT 320 379 379 429* 681   Basic Metabolic Panel: Recent Labs  Lab 10/03/19 0423 10/03/19 0423 10/04/19 0805 10/04/19 0805 10/05/19 1455 10/06/19 0613 10/07/19 1142 10/08/19 0759  10/09/19 0046  NA 141   < > 139   < > 140 140 142 141 142  K 4.0   < > 3.9   < > 3.5 3.6 3.2* 3.3* 3.4*  CL 98   < > 95*   < > 96* 96* 96* 98 99  CO2 31   < > 27   < > 27 26 27 26 27   GLUCOSE 174*   < > 142*   < > 153* 175* 134* 171* 139*  BUN 48*   < > 91*   < > 81* 108* 156* 88* 115*  CREATININE 5.47*   < > 7.02*   < > 5.68* 6.92* 8.68* 6.15* 7.25*  CALCIUM 8.8*   < > 9.0   < > 9.0 9.0 8.8* 8.9 8.8*  MG 2.0  --   --   --   --   --   --   --   --   PHOS 3.9   < > 3.3  --  3.2 4.1 4.3  --  4.7*   < > = values in this interval not displayed.   GFR: Estimated Creatinine Clearance: 6.5 mL/min (A) (by C-G formula based on SCr of 7.25 mg/dL (H)). Liver Function Tests: Recent Labs  Lab 10/04/19 0805 10/05/19 1455 10/06/19 0613 10/07/19 1142 10/09/19 0046  ALBUMIN 2.7* 3.0* 3.2* 3.1* 3.1*   No results for input(s): LIPASE, AMYLASE in the last 168 hours. No results for input(s): AMMONIA in the last 168 hours. Coagulation Profile: No results for input(s): INR, PROTIME in the last 168 hours. Cardiac Enzymes: No results for input(s): CKTOTAL, CKMB, CKMBINDEX, TROPONINI in the last 168 hours. BNP (last 3 results) No results for input(s): PROBNP in the last 8760 hours. HbA1C: No results for input(s): HGBA1C in the last 72 hours. CBG: Recent Labs  Lab 10/08/19 2008 10/09/19 0000 10/09/19 0430 10/09/19 0808 10/09/19 1611  GLUCAP 138* 128* 133* 132* 111*    Recent Results (from the past 240 hour(s))  SARS CORONAVIRUS 2 (TAT 6-24 HRS) Nasopharyngeal Nasopharyngeal Swab     Status: None   Collection Time: 10/01/19 12:28 PM   Specimen: Nasopharyngeal Swab  Result Value Ref Range Status   SARS Coronavirus 2 NEGATIVE NEGATIVE Final    Comment: (NOTE) SARS-CoV-2 target nucleic acids are NOT DETECTED. The SARS-CoV-2 RNA is generally detectable in upper and lower respiratory specimens during the acute phase of infection. Negative results do not preclude SARS-CoV-2 infection, do not  rule out co-infections with other pathogens, and should not  be used as the sole basis for treatment or other patient management decisions. Negative results must be combined with clinical observations, patient history, and epidemiological information. The expected result is Negative. Fact Sheet for Patients: SugarRoll.be Fact Sheet for Healthcare Providers: https://www.woods-mathews.com/ This test is not yet approved or cleared by the [place] FDA and  has been authorized for detection and/or diagnosis of SARS-CoV-2 by FDA under an Emergency Use Authorization (EUA). This EUA will remain  in effect (meaning this test can be used) for the duration of the COVID-19 declaration under Section 56 4(b)(1) of the Act, 21 U.S.C. section 360bbb-3(b)(1), unless the authorization is terminated or revoked sooner. Performed at [facility], [place] [address], [place], [place] [address]   SARS CORONAVIRUS 2 (TAT 6-24 HRS) Nasopharyngeal Nasopharyngeal Swab     Status: None   Collection Time: 10/05/19  3:11 PM   Specimen: Nasopharyngeal Swab  Result Value Ref Range Status   SARS Coronavirus 2 NEGATIVE NEGATIVE Corrected    Comment: RESULT CALLED TO, READ BACK BY AND VERIFIED WITH: THIS IS A CORRECTED REPORT. PREVIOUSLY CALLED REPORT WAS POSITIVE. CALLED TO N [facility] RN 10/06/19 1755 JDW Performed at [facility] [address], [place], [place] [address] CORRECTED ON 03/19 AT 3976: PREVIOUSLY REPORTED AS POSITIVE RESULT CALLED TO, READ BACK BY AND VERIFIED WITH: RN  TINA MICHAEL AT [place] ON 10/06/2019       Radiology Studies: No results found.  Scheduled Meds: . B-complex with vitamin C  1 tablet Per Tube QHS  . Chlorhexidine Gluconate Cloth  6 each Topical Daily  . [START ON 10/11/2019] epoetin (EPOGEN/PROCRIT) injection  4,000 Units Intravenous Q M,W,F-HD  . feeding supplement (NEPRO CARB STEADY)  1,000 mL Per Tube  Q24H  . feeding supplement (PRO-STAT SUGAR FREE 64)  30 mL Per Tube BID  . free water  50 mL Per Tube 6 X Daily  . gabapentin  100 mg Per Tube Daily  . insulin aspart  0-6 Units Subcutaneous Q4H  . ipratropium  2 puff Inhalation Q6H  . lacosamide  100 mg Per Tube BID  . mouth rinse  15 mL Mouth Rinse BID  . methylPREDNISolone  4 mg Per Tube 4X daily taper  . mometasone-formoterol  2 puff Inhalation BID  . nutrition supplement (JUVEN)  1 packet Per Tube BID BM  . pantoprazole sodium  40 mg Per Tube BID     LOS: 11 days    Para Skeans, DO [facility] If 7PM-7AM, please contact night-coverage [email or web address] Password [facility] 10/09/2019, 6:50 PM

## 2019-10-09 NOTE — Progress Notes (Signed)
[facility]  ROUNDING NOTE   Subjective:   Patient is doing fair.  She got agitated after about 45 minutes into dialysis being uncomfortable on the recliner chair Treatment had to be discontinued as it was not safe with the patient moving around. Denies any acute complaints No shortness of breath Voices mumbling.  Hard to understand her answers.   Objective:  Vital signs in last 24 hours:  Temp:  [97.7 F (36.5 C)-98.5 F (36.9 C)] 98.5 F (36.9 C) (03/22 1037) Pulse Rate:  [75-87] 75 (03/22 1126) Resp:  [12-20] 17 (03/22 1126) BP: (143-164)/(56-96) 153/65 (03/22 1126) SpO2:  [96 %-99 %] 97 % (03/22 1126) Weight:  [75 kg] 75 kg (03/22 0456)  Weight change: -1.2 kg Filed Weights   10/06/19 0649 10/08/19 0500 10/09/19 0456  Weight: 77.3 kg 76.2 kg 75 kg    Intake/Output: No intake/output data recorded.   Intake/Output this shift:  No intake/output data recorded.  Physical Exam: General: NAD, patient resting in bed   Head: Normocephalic, atraumatic. Moist oral mucosal membranes  Eyes: Anicteric,   Lungs:  Clear to auscultation, On supplemental O2 [place]   Heart:  No rub  Abdomen:  Soft, nontender,   Extremities:  No peripheral edema.  Neurologic:  Alert, able to follow simple commands  Skin: No lesions  Access: Left arm AVG    Basic Metabolic Panel: Recent Labs  Lab 10/03/19 0423 10/03/19 0423 10/04/19 0805 10/04/19 0805 10/05/19 1455 10/05/19 1455 10/06/19 0613 10/06/19 0613 10/07/19 1142 10/08/19 0759 10/09/19 0046  NA 141   < > 139   < > 140  --  140  --  142 141 142  K 4.0   < > 3.9   < > 3.5  --  3.6  --  3.2* 3.3* 3.4*  CL 98   < > 95*   < > 96*  --  96*  --  96* 98 99  CO2 31   < > 27   < > 27  --  26  --  27 26 27   GLUCOSE 174*   < > 142*   < > 153*  --  175*  --  134* 171* 139*  BUN 48*   < > 91*   < > 81*  --  108*  --  156* 88* 115*  CREATININE 5.47*   < > 7.02*   < > 5.68*  --  6.92*  --  8.68* 6.15* 7.25*  CALCIUM 8.8*   < >  9.0   < > 9.0   < > 9.0   < > 8.8* 8.9 8.8*  MG 2.0  --   --   --   --   --   --   --   --   --   --   PHOS 3.9   < > 3.3  --  3.2  --  4.1  --  4.3  --  4.7*   < > = values in this interval not displayed.    Liver Function Tests: Recent Labs  Lab 10/04/19 0805 10/05/19 1455 10/06/19 0613 10/07/19 1142 10/09/19 0046  ALBUMIN 2.7* 3.0* 3.2* 3.1* 3.1*   No results for input(s): LIPASE, AMYLASE in the last 168 hours. No results for input(s): AMMONIA in the last 168 hours.  CBC: Recent Labs  Lab 10/05/19 1455 10/06/19 0613 10/06/19 1332 10/07/19 1142 10/08/19 0759  WBC 13.1* 13.2* 15.3* 20.7* 18.5*  HGB 8.1* 9.0* 9.3* 9.8* 10.0*  HCT 26.0* 28.7* 29.9* 30.8* 31.6*  MCV 92.2 92.6 93.4 93.3 92.1  PLT 320 379 379 429* 359    Cardiac Enzymes: No results for input(s): CKTOTAL, CKMB, CKMBINDEX, TROPONINI in the last 168 hours.  BNP: Invalid input(s): POCBNP  CBG: Recent Labs  Lab 10/08/19 1747 10/08/19 2008 10/09/19 0000 10/09/19 0430 10/09/19 0808  GLUCAP 132* 138* 128* 133* 132*    Microbiology: Results for orders placed or performed during the hospital encounter of 09/27/19  SARS CORONAVIRUS 2 (TAT 6-24 HRS) Nasopharyngeal Nasopharyngeal Swab     Status: None   Collection Time: 09/28/19  1:40 AM   Specimen: Nasopharyngeal Swab  Result Value Ref Range Status   SARS Coronavirus 2 NEGATIVE NEGATIVE Final    Comment: (NOTE) SARS-CoV-2 target nucleic acids are NOT DETECTED. The SARS-CoV-2 RNA is generally detectable in upper and lower respiratory specimens during the acute phase of infection. Negative results do not preclude SARS-CoV-2 infection, do not rule out co-infections with other pathogens, and should not be used as the sole basis for treatment or other patient management decisions. Negative results must be combined with clinical observations, patient history, and epidemiological information. The expected result is Negative. Fact Sheet for  Patients: SugarRoll.be Fact Sheet for Healthcare Providers: https://www.woods-mathews.com/ This test is not yet approved or cleared by the [place] FDA and  has been authorized for detection and/or diagnosis of SARS-CoV-2 by FDA under an Emergency Use Authorization (EUA). This EUA will remain  in effect (meaning this test can be used) for the duration of the COVID-19 declaration under Section 56 4(b)(1) of the Act, 21 U.S.C. section 360bbb-3(b)(1), unless the authorization is terminated or revoked sooner. Performed at [facility], [place] [address], [place], [place] [address]   MRSA PCR Screening     Status: None   Collection Time: 09/28/19  3:08 AM   Specimen: Nasal Mucosa; Nasopharyngeal  Result Value Ref Range Status   MRSA by PCR NEGATIVE NEGATIVE Final    Comment:        The GeneXpert MRSA Assay (FDA approved for NASAL specimens only), is one component of a comprehensive MRSA colonization surveillance program. It is not intended to diagnose MRSA infection nor to guide or monitor treatment for MRSA infections. Performed at [facility], [place], [place] [address]   SARS CORONAVIRUS 2 (TAT 6-24 HRS) Nasopharyngeal Nasopharyngeal Swab     Status: None   Collection Time: 10/01/19 12:28 PM   Specimen: Nasopharyngeal Swab  Result Value Ref Range Status   SARS Coronavirus 2 NEGATIVE NEGATIVE Final    Comment: (NOTE) SARS-CoV-2 target nucleic acids are NOT DETECTED. The SARS-CoV-2 RNA is generally detectable in upper and lower respiratory specimens during the acute phase of infection. Negative results do not preclude SARS-CoV-2 infection, do not rule out co-infections with other pathogens, and should not be used as the sole basis for treatment or other patient management decisions. Negative results must be combined with clinical observations, patient history, and epidemiological information. The  expected result is Negative. Fact Sheet for Patients: SugarRoll.be Fact Sheet for Healthcare Providers: https://www.woods-mathews.com/ This test is not yet approved or cleared by the [place] FDA and  has been authorized for detection and/or diagnosis of SARS-CoV-2 by FDA under an Emergency Use Authorization (EUA). This EUA will remain  in effect (meaning this test can be used) for the duration of the COVID-19 declaration under Section 56 4(b)(1) of the Act, 21 U.S.C. section 360bbb-3(b)(1), unless the authorization is  terminated or revoked sooner. Performed at [facility], [place] [address], [place], [place] [address]   SARS CORONAVIRUS 2 (TAT 6-24 HRS) Nasopharyngeal Nasopharyngeal Swab     Status: None   Collection Time: 10/05/19  3:11 PM   Specimen: Nasopharyngeal Swab  Result Value Ref Range Status   SARS Coronavirus 2 NEGATIVE NEGATIVE Corrected    Comment: RESULT CALLED TO, READ BACK BY AND VERIFIED WITH: THIS IS A CORRECTED REPORT. PREVIOUSLY CALLED REPORT WAS POSITIVE. CALLED TO N [facility] RN 10/06/19 1755 JDW Performed at [facility] [address], [place], [place] [address] CORRECTED ON 03/19 AT 1093: PREVIOUSLY REPORTED AS POSITIVE RESULT CALLED TO, READ BACK BY AND VERIFIED WITH: RN  TINA MICHAEL AT [place] ON 10/06/2019     Coagulation Studies: No results for input(s): LABPROT, INR in the last 72 hours.  Urinalysis: No results for input(s): COLORURINE, LABSPEC, PHURINE, GLUCOSEU, HGBUR, BILIRUBINUR, KETONESUR, PROTEINUR, UROBILINOGEN, NITRITE, LEUKOCYTESUR in the last 72 hours.  Invalid input(s): APPERANCEUR    Imaging: No results found.   Medications:    . B-complex with vitamin C  1 tablet Per Tube QHS  . Chlorhexidine Gluconate Cloth  6 each Topical Daily  . epoetin (EPOGEN/PROCRIT) injection  10,000 Units Intravenous Q T,Th,Sa-HD  . feeding supplement (NEPRO CARB STEADY)  1,000  mL Per Tube Q24H  . feeding supplement (PRO-STAT SUGAR FREE 64)  30 mL Per Tube BID  . free water  50 mL Per Tube 6 X Daily  . gabapentin  100 mg Per Tube Daily  . insulin aspart  0-6 Units Subcutaneous Q4H  . ipratropium  2 puff Inhalation Q6H  . lacosamide  100 mg Per Tube BID  . mouth rinse  15 mL Mouth Rinse BID  . methylPREDNISolone  4 mg Per Tube 4X daily taper  . mometasone-formoterol  2 puff Inhalation BID  . nutrition supplement (JUVEN)  1 packet Per Tube BID BM  . pantoprazole sodium  40 mg Per Tube BID   acetaminophen (TYLENOL) oral liquid 160 mg/5 mL, acetaminophen, albuterol, diclofenac Sodium, liver oil-zinc oxide  Assessment/ Plan:  Barbara [name] is a 79 y.o. black female with end stage renal disease on hemodialysis, hypertension, diabetes mellitus type II, diabetic neuropathy, seizure disorder, left hip repair, anemia of chronic kidney disease, secondary hyperparathyroidism. Acute GI bleeding [K92.2] Encounter for central line placement [Z45.2] PEG tube malfunction ([place]) [K94.23]   [facility] MWF [facility] LUE AVG 72 kg  FDODE 11/20/2016   1.  ESRD on HD:  Dialysis done only for 45 minutes, then had to be discontinued due to agitation We will plan on trial of dialysis again on Tuesday   2. Anemia of chronic kidney disease.  With GI bleed.  EGD from March 11 showed nonbleeding gastric ulcer Requiring PRBC transfusion on 3/10 and 3/11.  - Continue EPO with hemodialysis   - pantoprazole.  Lab Results  Component Value Date   HGB 10.0 (L) 10/08/2019     3.  Secondary hyperparathyroidism.  Lab Results  Component Value Date   PTH 310 (H) 09/06/2019   CALCIUM 8.8 (L) 10/09/2019   CAION 0.85 (LL) 09/05/2019   PHOS 4.7 (H) 10/09/2019      4.  Confusion, delirium, acute encephalopathy Likely metabolic avoid narcotics Tylenol, Voltaren gel for right knee pain-patient states it has helped     LOS: 11 Barbara [name] 3/22/20212:19 PM

## 2019-10-09 NOTE — Progress Notes (Signed)
Physical Therapy Treatment Patient Details Name: Barbara [name] MRN: [id number] DOB: 04-25-1941 Today's Date: 10/09/2019    History of Present Illness 79 yo female with ESRD on HD admitted with acute GI bleed s/p upper endoscopy requiring transfer to the stepdown unit with worsening acute on chronic hypoxic respiratory failure secondary to acute on chronic CHF exacerbation and AECOPD requiring HFNC (transfered to CCU 3/15).  She is s/p L total hip (posterior approach) mid February    PT Comments    Pt currently off all isolation, confirmed with RN. Pt very alert and agreeable to session. Needs cues for initiation of mobility attempts and still slightly confused. Able to perform SPT to recliner with +2 assist with safe technique. Unsafe for ambulation attempt this date. Once seated in recliner, positioned with pillows to prevent L hip IR as she keeps trying to cross legs. Educated on hip precautions and chair alarm activated. Will continue to progress as able.  Follow Up Recommendations  SNF     Equipment Recommendations  Rolling walker with 5" wheels;3in1 (PT);Wheelchair cushion (measurements PT);Wheelchair (measurements PT)    Recommendations for Other Services       Precautions / Restrictions Precautions Precautions: Posterior Hip;Fall Precaution Booklet Issued: Yes (comment) Restrictions Weight Bearing Restrictions: Yes LLE Weight Bearing: Weight bearing as tolerated    Mobility  Bed Mobility Overal bed mobility: Needs Assistance Bed Mobility: Supine to Sit;Sit to Supine     Supine to sit: Mod assist;+2 for physical assistance     General bed mobility comments: Able to follow commands, needs cues for initiation of task. Once seated at EOB, able to sit with upright posture.  Transfers Overall transfer level: Needs assistance Equipment used: Rolling walker (2 wheeled);None Transfers: Sit to/from Stand Sit to Stand: Max assist;+2 safety/equipment;From elevated surface;+2  physical assistance         General transfer comment: 1st attempt with RW, able to stand for a few seconds, prior to fatigue and sitting back on bed. Needs cues for initiating mobility. 2nd attempt with +2 assist and no AD for SPT to recliner. safe technique  Ambulation/Gait             General Gait Details: unsafe for ambulation attempts at this time   Stairs             Wheelchair Mobility    Modified Rankin (Stroke Patients Only)       Balance Overall balance assessment: Needs assistance Sitting-balance support: Bilateral upper extremity supported;Feet supported Sitting balance-Leahy Scale: Fair     Standing balance support: Bilateral upper extremity supported Standing balance-Leahy Scale: Fair                              Cognition Arousal/Alertness: Awake/alert Behavior During Therapy: WFL for tasks assessed/performed Overall Cognitive Status: Within Functional Limits for tasks assessed                                 General Comments: more awake and alert. agreeable to therapy and pleasant      Exercises Other Exercises Other Exercises: supine ther-ex performed on B LE including SAQ, SLRs, hip abd/add, and quad sets. All ther-ex performed x 15 reps with mod assist. Needs cues for initiation of mobility    General Comments        Pertinent Vitals/Pain Pain Assessment: No/denies pain    Home Living  Prior Function            PT Goals (current goals can now be found in the care plan section) Acute Rehab PT Goals Patient Stated Goal: to get up to the chair PT Goal Formulation: With patient Time For Goal Achievement: 10/17/19 Potential to Achieve Goals: Fair Progress towards PT goals: Progressing toward goals    Frequency    Min 2X/week      PT Plan Current plan remains appropriate    Co-evaluation              AM-PAC PT "6 Clicks" Mobility   Outcome Measure  Help  needed turning from your back to your side while in a flat bed without using bedrails?: A Little Help needed moving from lying on your back to sitting on the side of a flat bed without using bedrails?: A Lot Help needed moving to and from a bed to a chair (including a wheelchair)?: A Lot Help needed standing up from a chair using your arms (e.g., wheelchair or bedside chair)?: A Lot Help needed to walk in hospital room?: Total Help needed climbing 3-5 steps with a railing? : Total 6 Click Score: 11    End of Session Equipment Utilized During Treatment: Gait belt(high secondary to feeding tube) Activity Tolerance: Patient tolerated treatment well Patient left: in chair;with chair alarm set Nurse Communication: Mobility status PT Visit Diagnosis: Muscle weakness (generalized) (M62.81);Difficulty in walking, not elsewhere classified (R26.2) Pain - Right/Left: Left Pain - part of body: Hip     Time: 6283-1517 PT Time Calculation (min) (ACUTE ONLY): 23 min  Charges:  $Therapeutic Exercise: 8-22 mins $Therapeutic Activity: 8-22 mins                     Greggory Stallion, PT, DPT [phone]    Cameron Katayama 10/09/2019, 11:16 AM

## 2019-10-09 NOTE — Progress Notes (Signed)
Bipap declined 

## 2019-10-09 NOTE — TOC Progression Note (Addendum)
Transition of Care [facility]) - Progression Note    Patient Details  Name: Barbara [name] MRN: [id number] Date of Birth: 1941/03/16  Transition of Care [facility]) CM/SW Contact  Shade Flood, LCSW Phone Number: 10/09/2019, 12:52 PM  Clinical Narrative:     TOC following. Discussed pt with MD who states she is not stable for dc today. Spoke with Otila Kluver at [facility] to update on pt status. Per Otila Kluver, they can take pt back when she is stable. This LCSW started insurance auth with [employer] today. [employer] reference number for the [place] is [id number].  Pt will need new negative COVID test result within 4 days prior to dc. Updated MD.  Will follow and continue to assist with dc planning needs.   Expected Discharge Plan: Skilled Nursing Facility Barriers to Discharge: Insurance Authorization  Expected Discharge Plan and Services Expected Discharge Plan: [place]       Living arrangements for the past 2 months: [place]                                       Social Determinants of Health (SDOH) Interventions    Readmission Risk Interventions Readmission Risk Prevention Plan 09/29/2019  Transportation Screening Complete  Medication Review (RN Care Manager) Complete  Palliative Care Screening Not Applicable  Some recent data might be hidden

## 2019-10-09 NOTE — NC FL2 (Addendum)
[place] LEVEL OF CARE SCREENING TOOL     IDENTIFICATION  Patient Name: Barbara [name] Birthdate: 15-Oct-1940 Sex: female Admission Date (Current Location): 09/27/2019  [place] and [place] Number:  Engineering geologist and Address:  [facility], [address] [place], [place] [address]      Provider Number: [id number]  Attending Physician Name and Address:  Para Skeans, MD  Relative Name and Phone Number:       Current Level of Care: Hospital Recommended Level of Care: [place] Prior Approval Number:    Date Approved/Denied:   PASRR Number: [id number]  Discharge Plan: SNF    Current Diagnoses: Patient Active Problem List   Diagnosis Date Noted  . Acute GI bleeding 09/28/2019  . Acute respiratory failure with hypoxia ([place]) 09/28/2019  . Chronic diastolic CHF (congestive heart failure) ([place]) 09/28/2019  . Seizure ([place]) 09/28/2019  . PEG tube malfunction ([place]) 09/28/2019  . Sacral ulcer ([place]) 09/23/2019  . Dysphagia 09/19/2019  . Acute encephalopathy   . Goals of care, counseling/discussion   . Aspiration pneumonia ([place]) 09/11/2019  . Status post total hip replacement, left 09/05/2019  . Type II diabetes mellitus with renal manifestations ([place]) 09/05/2019  . ESRD on dialysis ([place]) 09/05/2019  . Bacteremia due to other bacteria   . Bacteroides fragilis infection   . Acute metabolic encephalopathy   . Somnolence   . Lobar pneumonia ([place]) 05/11/2019  . Dialysis patient ([place])   . Primary osteoarthritis of left hip 04/07/2019  . Metabolic acidosis 28/78/6767  . Complication from renal dialysis device 01/28/2018  . History of colonic polyps 07/30/2017  . Presence of cardiac and vascular implant and graft   . Fever   . Sepsis ([place])   . ESRD (end stage renal disease) ([place]) 02/01/2017  . Sepsis secondary to UTI ([place]) 01/24/2017  . Acute kidney injury ([place]) 11/19/2016  . Anemia, chronic renal failure, stage 4  (severe) ([place]) 09/17/2016  . Hypoglycemia 06/04/2016  . Hyperkalemia 05/12/2016  . Protein-calorie malnutrition, severe 05/05/2016  . Acute on chronic renal failure ([place]) 05/04/2016  . Convulsions ([place]) 04/17/2016  . Palliative care by specialist   . DNR (do not resuscitate) discussion   . C. difficile diarrhea 03/11/2016  . Anemia 03/11/2016  . Hypotension 03/11/2016  . Acidosis 03/11/2016  . Failure to thrive (child) 03/11/2016  . Weakness generalized 03/11/2016  . ARF (acute renal failure) ([place]) 03/04/2016  . Cancer of trigone of urinary bladder ([place]) 03/02/2016  . Absolute anemia 02/04/2016  . Airway hyperreactivity 02/04/2016  . Celiac disease 02/04/2016  . Gastric catarrh 02/04/2016  . Acid reflux 02/04/2016  . Combined fat and carbohydrate induced hyperlipemia 02/04/2016  . C. difficile colitis 01/22/2016  . Urinary obstruction 01/21/2016  . COPD (chronic obstructive pulmonary disease) ([place]) 01/21/2016  . Controlled type 2 diabetes mellitus with stage 4 chronic kidney disease, with long-term current use of insulin ([place]) 01/21/2016  . Essential hypertension 01/21/2016  . Acute renal failure superimposed on stage 4 chronic kidney disease ([place]) 01/20/2016  . Leukocytosis 01/20/2016  . Anemia in chronic kidney disease 01/20/2016  . Arthritis of knee, degenerative 05/10/2015  . Knee strain 03/26/2015  . Other intervertebral disc displacement, lumbar region 03/04/2015  . Degenerative arthritis of lumbar spine 03/04/2015  . HNP (herniated nucleus pulposus), lumbar 03/04/2015  . Osteoarthritis of spine with radiculopathy, lumbar region 03/04/2015  . Injury of tendon of upper extremity 11/12/2014  . Atherosclerosis of abdominal aorta ([place]) 11/02/2014  .  Chronic kidney disease, stage IV (severe) ([place]) 11/02/2014  . Obstructive apnea 11/02/2014  . Complete rotator cuff rupture of left shoulder 10/05/2014  . Arthritis of shoulder region, degenerative 10/05/2014  . CAD in native  artery 12/08/2013  . Benign essential HTN 12/08/2013  . Type 2 diabetes mellitus ([place]) 12/08/2013    Orientation RESPIRATION BLADDER Height & Weight     Self  O2(2L) Incontinent Weight: 165 lb 5.5 oz (75 kg) Height:  5' 6"  (167.6 cm)  BEHAVIORAL SYMPTOMS/MOOD NEUROLOGICAL BOWEL NUTRITION STATUS      Incontinent Diet, Feeding tube(see dc summary/RD notes)  AMBULATORY STATUS COMMUNICATION OF NEEDS Skin   Extensive Assist Verbally Surgical wounds, PU Stage and Appropriate Care, Bruising                       Personal Care Assistance Level of Assistance  Bathing, Feeding, Dressing Bathing Assistance: Maximum assistance Feeding assistance: Limited assistance       Functional Limitations Info        Speech Info: Impaired    SPECIAL CARE FACTORS FREQUENCY  PT (By licensed PT), OT (By licensed OT)     PT Frequency: 5x OT Frequency: 5x     Speech Therapy Frequency: 3-5x      Contractures Contractures Info: Not present    Additional Factors Info  Code Status(HD MWF, peg tube) Code Status Info: Full Allergies Info: Glipizide ER, Percocet           Current Medications (10/09/2019):  This is the current hospital active medication list Current Facility-Administered Medications  Medication Dose Route Frequency Provider Last Rate Last Admin  . acetaminophen (TYLENOL) 160 MG/5ML solution 650 mg  650 mg Oral Q6H PRN Lavina Hamman, MD   650 mg at 10/08/19 2037  . acetaminophen (TYLENOL) tablet 650 mg  650 mg Per Tube Q4H PRN Singh, Harmeet, MD      . albuterol (VENTOLIN HFA) 108 (90 Base) MCG/ACT inhaler 2 puff  2 puff Inhalation Q6H PRN Para Skeans, MD      . B-complex with vitamin C tablet 1 tablet  1 tablet Per Tube QHS Lavina Hamman, MD   1 tablet at 10/08/19 2033  . Chlorhexidine Gluconate Cloth 2 % PADS 6 each  6 each Topical Daily Lavina Hamman, MD   6 each at 10/08/19 1113  . diclofenac Sodium (VOLTAREN) 1 % topical gel 2 g  2 g Topical QID PRN Sharion Settler, NP   2 g at 10/07/19 1543  . epoetin alfa (EPOGEN) injection 10,000 Units  10,000 Units Intravenous Q T,Th,Sa-HD Lavonia Dana, MD   10,000 Units at 10/07/19 1543  . feeding supplement (NEPRO CARB STEADY) liquid 1,000 mL  1,000 mL Per Tube Q24H Lavina Hamman, MD 60 mL/hr at 10/08/19 1610 1,000 mL at 10/08/19 1610  . feeding supplement (PRO-STAT SUGAR FREE 64) liquid 30 mL  30 mL Per Tube BID Lavina Hamman, MD   30 mL at 10/08/19 2037  . free water 50 mL  50 mL Per Tube 6 X Daily Lavina Hamman, MD   50 mL at 10/09/19 0717  . gabapentin (NEURONTIN) 250 MG/5ML solution 100 mg  100 mg Per Tube Daily Kolluru, Sarath, MD   100 mg at 10/08/19 1113  . insulin aspart (novoLOG) injection 0-6 Units  0-6 Units Subcutaneous Q4H Awilda Bill, NP   1 Units at 10/08/19 1226  . ipratropium (ATROVENT HFA) inhaler 2 puff  2  puff Inhalation Q6H Para Skeans, MD   2 puff at 10/09/19 0455  . lacosamide (VIMPAT) tablet 100 mg  100 mg Per Tube BID Lavina Hamman, MD   100 mg at 10/08/19 2034  . liver oil-zinc oxide (DESITIN) 40 % ointment   Topical QID PRN Murlean Iba, MD      . MEDLINE mouth rinse  15 mL Mouth Rinse BID Lavina Hamman, MD   15 mL at 10/08/19 2038  . methylPREDNISolone (MEDROL DOSEPAK) tablet 4 mg  4 mg Per Tube 4X daily taper Para Skeans, MD   4 mg at 10/09/19 0800  . mometasone-formoterol (DULERA) 100-5 MCG/ACT inhaler 2 puff  2 puff Inhalation BID Para Skeans, MD   2 puff at 10/09/19 0800  . nutrition supplement (JUVEN) (JUVEN) powder packet 1 packet  1 packet Per Tube BID BM Florina Ou V, MD      . pantoprazole sodium (PROTONIX) 40 mg/20 mL oral suspension 40 mg  40 mg Per Tube BID Lavina Hamman, MD   40 mg at 10/08/19 2039     Discharge Medications: Please see discharge summary for a list of discharge medications.  Relevant Imaging Results:  Relevant Lab Results:   Additional Information SSN: [id number]    Please send Colman Cater underneath patient when she  goes to outpatient HD  Shade Flood, LCSW

## 2019-10-09 NOTE — Progress Notes (Signed)
Nutrition Follow-up   DOCUMENTATION CODES:   Not applicable  INTERVENTION:  Continue Nepro at 60 mL/hr x 16 hrs (1600-0800). This is 960 mL goal daily volume. Also provide Pro-Stat 30 mL BID per tube. Goal regiment provides 1928 kcal, 108 grams of protein, 701 mL H2O daily.  Flush tube with 50 mL Q3hrs while tube feeds are running from 1600-0800. This will provide 300 mL additional water for a total of approximately 1001 mL daily not including water patient receives with medications.  Continue B-complex with C QHS per tube.  Add Juven BID per tube to promote wound healing.  NUTRITION DIAGNOSIS:   Inadequate oral intake related to dysphagia as evidenced by (reliance on tube feed regimen per PEG tube to meet calorie/protein/hydration needs.).  Ongoing - addressing with TF regimen.  GOAL:   Patient will meet greater than or equal to 90% of their needs  Met with TF regimen.  MONITOR:   Labs, Weight trends, TF tolerance, Skin, I & O's  REASON FOR ASSESSMENT:   Consult Enteral/tube feeding initiation and management  ASSESSMENT:   79 year old female with PMHx of COPD, HTN, arthritis, GERD, CAD, asthma, ESRD on HD, celiac disease, DM, sleep apnea, bladder cancer s/p chemo/XRT, recent left total hip arthroplasty on 09/05/2019, dysphagia s/p PEG tube placement on 09/20/2019 who is admitted from [facility] after pulling out PEG tube and with coffee-ground emesis. Patient s/p replacement of PEG tube on 3/11.  Patient had positive COVID test on 3/19 and was moved to 1C with precautions. Patient continues to tolerate TF regimen. RD received consult for wound healing. Per RN documentation of wound it is 100% epithelialized but will add Juven to help with wound healing process.  Medications reviewed and include: B-complex with C QHS, Epogen 10000 units during HD, gabapentin, Novolog 0-6 units Q4hrs, Medrol Dosepack 4 mg 4x daily tapering, Protonix.  Labs reviewed: CBG 128-138,  Potassium 3.4, BUN 115, Creatinine 7.25, Phosphorus 4.7.  Enteral Access: PEG tube replaced on 09/28/2019  Discussed with RN over the phone.  Diet Order:   Diet Order            Diet NPO time specified  Diet effective now             EDUCATION NEEDS:   No education needs have been identified at this time  Skin:  Skin Assessment: Skin Integrity Issues:(stg II buttocks 100% epithelialized)  Last BM:  10/09/2019 medium type 7; rectal tube now in place  Height:   Ht Readings from Last 1 Encounters:  09/28/19 5' 6"  (1.676 m)   Weight:   Wt Readings from Last 1 Encounters:  10/09/19 75 kg   Ideal Body Weight:  59.1 kg  BMI:  Body mass index is 26.69 kg/m.  Estimated Nutritional Needs:   Kcal:  1700-1900  Protein:  95-110 grams  Fluid:  UOP + 1 L  Jacklynn Barnacle, MS, RD, LDN Pager number available on Amion

## 2019-10-09 NOTE — Care Management Important Message (Signed)
Important Message  Patient Details  Name: Barbara [name] MRN: [id number] Date of Birth: April 24, 1941   Medicare Important Message Given:  Yes     Shade Flood, LCSW 10/09/2019, 1:17 PM

## 2019-10-10 LAB — CBC
HCT: 30.7 % — ABNORMAL LOW (ref 36.0–46.0)
Hemoglobin: 9.9 g/dL — ABNORMAL LOW (ref 12.0–15.0)
MCH: 29.2 pg (ref 26.0–34.0)
MCHC: 32.2 g/dL (ref 30.0–36.0)
MCV: 90.6 fL (ref 80.0–100.0)
Platelets: 267 10*3/uL (ref 150–400)
RBC: 3.39 MIL/uL — ABNORMAL LOW (ref 3.87–5.11)
RDW: 16.8 % — ABNORMAL HIGH (ref 11.5–15.5)
WBC: 16 10*3/uL — ABNORMAL HIGH (ref 4.0–10.5)
nRBC: 0.1 % (ref 0.0–0.2)

## 2019-10-10 LAB — BASIC METABOLIC PANEL
Anion gap: 17 — ABNORMAL HIGH (ref 5–15)
BUN: 104 mg/dL — ABNORMAL HIGH (ref 8–23)
CO2: 27 mmol/L (ref 22–32)
Calcium: 8.7 mg/dL — ABNORMAL LOW (ref 8.9–10.3)
Chloride: 96 mmol/L — ABNORMAL LOW (ref 98–111)
Creatinine, Ser: 8.54 mg/dL — ABNORMAL HIGH (ref 0.44–1.00)
GFR calc Af Amer: 5 mL/min — ABNORMAL LOW (ref >60)
GFR calc non Af Amer: 4 mL/min — ABNORMAL LOW (ref >60)
Glucose, Bld: 134 mg/dL — ABNORMAL HIGH (ref 70–99)
Potassium: 3.7 mmol/L (ref 3.5–5.1)
Sodium: 140 mmol/L (ref 135–145)

## 2019-10-10 LAB — GLUCOSE, CAPILLARY
Glucose-Capillary: 138 mg/dL — ABNORMAL HIGH (ref 70–99)
Glucose-Capillary: 147 mg/dL — ABNORMAL HIGH (ref 70–99)
Glucose-Capillary: 148 mg/dL — ABNORMAL HIGH (ref 70–99)
Glucose-Capillary: 164 mg/dL — ABNORMAL HIGH (ref 70–99)

## 2019-10-10 LAB — HEPATITIS B SURFACE ANTIGEN: Hepatitis B Surface Ag: NONREACTIVE

## 2019-10-10 LAB — RESPIRATORY PANEL BY RT PCR (FLU A&B, COVID)
Influenza A by PCR: NEGATIVE
Influenza B by PCR: NEGATIVE
SARS Coronavirus 2 by RT PCR: NEGATIVE

## 2019-10-10 NOTE — Plan of Care (Signed)
Discharged to skilled nursing

## 2019-10-10 NOTE — Discharge Instructions (Signed)
Also f/u with your dialysis appreciate.

## 2019-10-10 NOTE — Progress Notes (Signed)
[facility]  ROUNDING NOTE   Subjective:   Patient is doing fair.  Denies any acute c/o Does not want to do dialysis today K 3.7  Objective:  Vital signs in last 24 hours:  Temp:  [97.9 F (36.6 C)] 97.9 F (36.6 C) (03/22 2340) Pulse Rate:  [82-84] 84 (03/23 0805) Resp:  [18-21] 21 (03/23 0805) BP: (138-156)/(55-65) 156/65 (03/23 0805) SpO2:  [96 %-97 %] 97 % (03/23 0805) Weight:  [76.4 kg] 76.4 kg (03/23 0500)  Weight change: 1.4 kg Filed Weights   10/08/19 0500 10/09/19 0456 10/10/19 0500  Weight: 76.2 kg 75 kg 76.4 kg    Intake/Output: I/O last 3 completed shifts: In: 0.6 [I.V.:0.6] Out: -    Intake/Output this shift:  No intake/output data recorded.  Physical Exam: General: NAD, patient resting in bed   Head: Normocephalic, atraumatic. Moist oral mucosal membranes  Eyes: Anicteric,   Lungs:  Clear to auscultation, On supplemental O2 [place]   Heart:  No rub  Abdomen:  Soft, nontender,   Extremities:  No peripheral edema.  Neurologic:  Alert, oriented to self and place, able to follow simple commands  Skin: No lesions  Access: Left arm AVG    Basic Metabolic Panel: Recent Labs  Lab 10/04/19 0805 10/04/19 0805 10/05/19 1455 10/05/19 1455 10/06/19 0613 10/06/19 0613 10/07/19 1142 10/07/19 1142 10/08/19 0759 10/09/19 0046 10/10/19 1054  NA 139   < > 140   < > 140  --  142  --  141 142 140  K 3.9   < > 3.5   < > 3.6  --  3.2*  --  3.3* 3.4* 3.7  CL 95*   < > 96*   < > 96*  --  96*  --  98 99 96*  CO2 27   < > 27   < > 26  --  27  --  26 27 27   GLUCOSE 142*   < > 153*   < > 175*  --  134*  --  171* 139* 134*  BUN 91*   < > 81*   < > 108*  --  156*  --  88* 115* 104*  CREATININE 7.02*   < > 5.68*   < > 6.92*  --  8.68*  --  6.15* 7.25* 8.54*  CALCIUM 9.0   < > 9.0   < > 9.0   < > 8.8*   < > 8.9 8.8* 8.7*  PHOS 3.3  --  3.2  --  4.1  --  4.3  --   --  4.7*  --    < > = values in this interval not displayed.    Liver Function  Tests: Recent Labs  Lab 10/04/19 0805 10/05/19 1455 10/06/19 0613 10/07/19 1142 10/09/19 0046  ALBUMIN 2.7* 3.0* 3.2* 3.1* 3.1*   No results for input(s): LIPASE, AMYLASE in the last 168 hours. No results for input(s): AMMONIA in the last 168 hours.  CBC: Recent Labs  Lab 10/06/19 0613 10/06/19 1332 10/07/19 1142 10/08/19 0759 10/10/19 1054  WBC 13.2* 15.3* 20.7* 18.5* 16.0*  HGB 9.0* 9.3* 9.8* 10.0* 9.9*  HCT 28.7* 29.9* 30.8* 31.6* 30.7*  MCV 92.6 93.4 93.3 92.1 90.6  PLT 379 379 429* 359 267    Cardiac Enzymes: No results for input(s): CKTOTAL, CKMB, CKMBINDEX, TROPONINI in the last 168 hours.  BNP: Invalid input(s): POCBNP  CBG: Recent Labs  Lab 10/09/19 1935 10/10/19 0040 10/10/19 0324 10/10/19 0802  10/10/19 1121  GLUCAP 119* 164* 148* 138* 147*    Microbiology: Results for orders placed or performed during the hospital encounter of 09/27/19  SARS CORONAVIRUS 2 (TAT 6-24 HRS) Nasopharyngeal Nasopharyngeal Swab     Status: None   Collection Time: 09/28/19  1:40 AM   Specimen: Nasopharyngeal Swab  Result Value Ref Range Status   SARS Coronavirus 2 NEGATIVE NEGATIVE Final    Comment: (NOTE) SARS-CoV-2 target nucleic acids are NOT DETECTED. The SARS-CoV-2 RNA is generally detectable in upper and lower respiratory specimens during the acute phase of infection. Negative results do not preclude SARS-CoV-2 infection, do not rule out co-infections with other pathogens, and should not be used as the sole basis for treatment or other patient management decisions. Negative results must be combined with clinical observations, patient history, and epidemiological information. The expected result is Negative. Fact Sheet for Patients: SugarRoll.be Fact Sheet for Healthcare Providers: https://www.woods-mathews.com/ This test is not yet approved or cleared by the [place] FDA and  has been authorized for detection  and/or diagnosis of SARS-CoV-2 by FDA under an Emergency Use Authorization (EUA). This EUA will remain  in effect (meaning this test can be used) for the duration of the COVID-19 declaration under Section 56 4(b)(1) of the Act, 21 U.S.C. section 360bbb-3(b)(1), unless the authorization is terminated or revoked sooner. Performed at [facility], [place] [address], [place], [place] [address]   MRSA PCR Screening     Status: None   Collection Time: 09/28/19  3:08 AM   Specimen: Nasal Mucosa; Nasopharyngeal  Result Value Ref Range Status   MRSA by PCR NEGATIVE NEGATIVE Final    Comment:        The GeneXpert MRSA Assay (FDA approved for NASAL specimens only), is one component of a comprehensive MRSA colonization surveillance program. It is not intended to diagnose MRSA infection nor to guide or monitor treatment for MRSA infections. Performed at [facility], [place], [place] [address]   SARS CORONAVIRUS 2 (TAT 6-24 HRS) Nasopharyngeal Nasopharyngeal Swab     Status: None   Collection Time: 10/01/19 12:28 PM   Specimen: Nasopharyngeal Swab  Result Value Ref Range Status   SARS Coronavirus 2 NEGATIVE NEGATIVE Final    Comment: (NOTE) SARS-CoV-2 target nucleic acids are NOT DETECTED. The SARS-CoV-2 RNA is generally detectable in upper and lower respiratory specimens during the acute phase of infection. Negative results do not preclude SARS-CoV-2 infection, do not rule out co-infections with other pathogens, and should not be used as the sole basis for treatment or other patient management decisions. Negative results must be combined with clinical observations, patient history, and epidemiological information. The expected result is Negative. Fact Sheet for Patients: SugarRoll.be Fact Sheet for Healthcare Providers: https://www.woods-mathews.com/ This test is not yet approved or cleared by the [place] FDA  and  has been authorized for detection and/or diagnosis of SARS-CoV-2 by FDA under an Emergency Use Authorization (EUA). This EUA will remain  in effect (meaning this test can be used) for the duration of the COVID-19 declaration under Section 56 4(b)(1) of the Act, 21 U.S.C. section 360bbb-3(b)(1), unless the authorization is terminated or revoked sooner. Performed at [facility], [place] [address] [place], [place] [address]   SARS CORONAVIRUS 2 (TAT 6-24 HRS) Nasopharyngeal Nasopharyngeal Swab     Status: None   Collection Time: 10/05/19  3:11 PM   Specimen: Nasopharyngeal Swab  Result Value Ref Range Status   SARS Coronavirus  2 NEGATIVE NEGATIVE Corrected    Comment: RESULT CALLED TO, READ BACK BY AND VERIFIED WITH: THIS IS A CORRECTED REPORT. PREVIOUSLY CALLED REPORT WAS POSITIVE. CALLED TO [facility] RN 10/06/19 1755 JDW Performed at [place] [address], [place], [place] [address] CORRECTED ON 03/19 AT 8372: PREVIOUSLY REPORTED AS POSITIVE RESULT CALLED TO, READ BACK BY AND VERIFIED WITH: RN  TINA MICHAEL AT [facility] ON 10/06/2019     Coagulation Studies: No results for input(s): LABPROT, INR in the last 72 hours.  Urinalysis: No results for input(s): COLORURINE, LABSPEC, PHURINE, GLUCOSEU, HGBUR, BILIRUBINUR, KETONESUR, PROTEINUR, UROBILINOGEN, NITRITE, LEUKOCYTESUR in the last 72 hours.  Invalid input(s): APPERANCEUR    Imaging: No results found.   Medications:   . sodium chloride Stopped (10/09/19 2231)   . B-complex with vitamin C  1 tablet Per Tube QHS  . Chlorhexidine Gluconate Cloth  6 each Topical Daily  . [START ON 10/11/2019] epoetin (EPOGEN/PROCRIT) injection  4,000 Units Intravenous Q M,W,F-HD  . feeding supplement (NEPRO CARB STEADY)  1,000 mL Per Tube Q24H  . feeding supplement (PRO-STAT SUGAR FREE 64)  30 mL Per Tube BID  . free water  50 mL Per Tube 6 X Daily  . gabapentin  100 mg Per Tube Daily  . insulin aspart   0-6 Units Subcutaneous Q4H  . ipratropium  2 puff Inhalation Q6H  . lacosamide  100 mg Per Tube BID  . mouth rinse  15 mL Mouth Rinse BID  . methylPREDNISolone  4 mg Per Tube 4X daily taper  . mometasone-formoterol  2 puff Inhalation BID  . nutrition supplement (JUVEN)  1 packet Per Tube BID BM  . pantoprazole sodium  40 mg Per Tube BID   sodium chloride, acetaminophen (TYLENOL) oral liquid 160 mg/5 mL, acetaminophen, albuterol, diclofenac Sodium, liver oil-zinc oxide  Assessment/ Plan:  Ms. SARELY STRACENER is a 79 y.o. black female with end stage renal disease on hemodialysis, hypertension, diabetes mellitus type II, diabetic neuropathy, seizure disorder, left hip repair, anemia of chronic kidney disease, secondary hyperparathyroidism. Acute GI bleeding [K92.2] Encounter for central line placement [Z45.2] PEG tube malfunction ([place]) [K94.23]   [facility] LUE AVG 72 kg  FDODE 11/20/2016   1.  ESRD on HD:  Patient does not want to dialyze today Routine dialysis today Can be outpatient if discharged today   2. Anemia of chronic kidney disease.  With GI bleed.  EGD from March 11 showed nonbleeding gastric ulcer Requiring PRBC transfusion on 3/10 and 3/11.  - Continue EPO with hemodialysis   - pantoprazole.  Lab Results  Component Value Date   HGB 9.9 (L) 10/10/2019     3.  Secondary hyperparathyroidism.  Lab Results  Component Value Date   PTH 310 (H) 09/06/2019   CALCIUM 8.7 (L) 10/10/2019   CAION 0.85 (LL) 09/05/2019   PHOS 4.7 (H) 10/09/2019      4.  Confusion, delirium, acute encephalopathy Likely metabolic More alert today avoid narcotics Tylenol, Voltaren gel for right knee pain-patient states it has helped     LOS: 12 Juanice Warburton 3/23/20211:28 PM

## 2019-10-10 NOTE — Plan of Care (Signed)
Pt verbalized feeling anxious about going to rehab and is refusing all medications

## 2019-10-10 NOTE — Discharge Summary (Addendum)
Physician Discharge Summary  Barbara [name] TKZ:[id number] DOB: 1941-06-15 DOA: 09/27/2019  PCP: [name], MD  Admit date: 09/27/2019 Discharge date: 10/10/2019  Time spent: 35 minutes  Discharge Diagnoses:  Principal Problem:   Acute GI bleeding Active Problems:   Leukocytosis   ESRD (end stage renal disease) ([place])   Type II diabetes mellitus with renal manifestations ([place])   Sacral ulcer ([place])   Acute respiratory failure with hypoxia (HCC)   Chronic diastolic CHF (congestive heart failure) (HCC)   Seizure (HCC)   PEG tube malfunction (HCC)  Acute GI bleed-Resolved Hemoglobin of 10 and and stable.  Cont iv ppi. Attribute to combination of GI bleed and iron def anemia and ACD due to her CKD,.   Acute blood loss anemia-Resolved Anemia of chronic kidney disease Presented with GI bleed. Hemoglobin dropped down to 8.1 pt on epo and transfuse as needed.  PEG tubedependency with dysphagia Placed on 09/20/2019 due to dysphagia Appreciate GI assistance for replacement of the G-tube. GI recommended to resume diet. Currently tolerating well.  Acute hypoxic respiratory failure/Acute on chronic systolic CHF/Acute bronchitis: Resolved . Pt is on Room Air with normal oxygen saturations.   Leukocytosis/concern for cellulitis This appears to have been chronic for the past week. No signs of fever.  Continue IV antibiotics.  ESRD on HD MWF Creatinine is stable Need to monitor for any fluid overload with blood transfusion Appreciate nephrology assistance. Patient will require to sit in the chair.  Chronic diastolic CHF:  Hemodialysis forVolumemanagement  Type 2 diabetes controlled. diet-controlled  Currently on sliding scale insulin. Also on Nepro for tube feeding. accuchecks ac/hs  Hx of seizure Continue home regimen per tube.  Stage 1 sacral and buttock ulcers  Wound care per RN   Acute encephalopathy- Resolved. From medication. Received fentanyl  early this morning. Continues to report severe right leg cramps. And given Robaxin. Continue to monitor for now.  Covid-19-error Pt was found to be covid-19 positive and started on remdesivir, however later today received report from nurse that  Results are erroneous and due to lack of iv access also iv t/t d/c and changed to po needed meds only.   Diarrhea-Resovled She had one stool today.  Discharge Condition: stable  Diet recommendation:  Cardiac/renal tube feed npo.  Filed Weights   10/08/19 0500 10/09/19 0456 10/10/19 0500  Weight: 76.2 kg 75 kg 76.4 kg   Discharge meds: Allergies as of 10/10/2019      Reactions   Glipizide Er Other (See Comments)   Hypoglycemia   Percocet [oxycodone-acetaminophen] Hives      Medication List    TAKE these medications   Accu-Chek Aviva Plus w/Device Kit (USE TO MONITOR BLOOD SUGARS.)   albuterol 108 (90 Base) MCG/ACT inhaler Commonly known as: VENTOLIN HFA Inhale 2 puffs into the lungs every 6 (six) hours as needed for wheezing or shortness of breath.   amLODipine 5 MG tablet Commonly known as: NORVASC Place 1 tablet (5 mg total) into feeding tube daily.   atorvastatin 40 MG tablet Commonly known as: LIPITOR Place 40 mg into feeding tube at bedtime.   B-complex with vitamin C tablet Place 1 tablet into feeding tube at bedtime.   Calcium Acetate 667 MG Tabs Place 1,334 mg into feeding tube 3 (three) times daily.   calcium carbonate 500 MG chewable tablet Commonly known as: TUMS - dosed in mg elemental calcium Place 1 tablet into feeding tube at bedtime.   cholecalciferol 1000 units tablet Commonly known as:  VITAMIN D Take 1 tablet (1,000 Units total) by mouth daily.   epoetin alfa 10000 UNIT/ML injection Commonly known as: EPOGEN Inject 1 mL (10,000 Units total) into the vein every Monday, Wednesday, and Friday with hemodialysis.   feeding supplement (PRO-STAT SUGAR FREE 64) Liqd Place 30 mLs into feeding tube 2  (two) times daily.   ferrous sulfate 300 (60 Fe) MG/5ML syrup Place 324 mg into feeding tube at bedtime.   free water Soln Place 50 mLs into feeding tube every 4 (four) hours.   gabapentin 300 MG capsule Commonly known as: NEURONTIN Place 2 capsules (600 mg total) into feeding tube 3 (three) times daily. What changed: how much to take   heparin 5000 UNIT/ML injection Inject 1 mL (5,000 Units total) into the skin every 12 (twelve) hours.   Lacosamide 100 MG Tabs Place 1 tablet (100 mg total) into feeding tube 2 (two) times daily.   multivitamin Tabs tablet Place 1 tablet into feeding tube at bedtime.   NexIUM 40 MG packet Generic drug: esomeprazole 40 mg daily before breakfast. Per tube   NON FORMULARY Place 1 Units into the nose at bedtime. CPAP Time of use 2100-0600   oxybutynin 5 MG tablet Commonly known as: DITROPAN Place 1 tablet (5 mg total) into feeding tube 3 (three) times daily.   OXYGEN Inhale 2 L into the lungs at bedtime.      History of present illness:  Barbara L Conyersis a 79 y.o.femalewith medical history significant forESRD on HD MWF, hyperlipidemia, type 2 diabetes, COPD, asthma, GERD, bladder cancer, and iron deficiency anemia who presents for concerns of acute GI bleed and anemia. SP EGD and replacement of the G-tube. Pt came with anemia and hg of 4.8  And received 2 untis of PRBC in ed. She was also recently  hospitalized from 02/16 to 03/7 following a left total hip arthroplasty (76/73/4193) complicated by toxic metabolic encephalopathy due to severe metabolic acidosis, and seizure activity (started on vimpat) requiring mechanical intubation/CRRTand PEG tube placement due to dysphagia then discharged to [facility]. She presented to [facility] ER on 03/10 via EMS from [facility] with coffee-ground emesis onset 2 days prior to presentation. Lab results at the facility revealed hgb of 5 prompting ER visit. It was also reported the pt  pulled out her PEG tube. In the ER lab results revealed Na+ 133, glucose 121, BUN 39, creatinine 4.37, albumin 2.5, lipase 63, creatinine 4.37, wbc 19.7, hgb 4.8 and COVID-19 negative. ER physician also reported the pt had a grossly melanotic stool. Pt received 2 unit of pRBC's, protonix gtt started, and GI consulted.   She has egd on 09/28/19 which showed non-bleeding gastric ulcer.  Then patient developed respiratory failure with bilateral wheezes currently being transferred to stepdown unit. I suspect respiratory failure from volume overload from transfusion.  Pt transferred to floor on 3/17 and has been stable and Currently further plan is continue treat respiratory failure and monitor for stability [facility]. Pt is table and needs authorization for discharge to snf, spouse st bedside today and states daughter and himself cant take care of her. Per requirement pt needs covid-19 pcr pending for d/c in am to a SNF.   Pt seen today is agitated and refuses care this si new behavior to me since a day ago, pt was almost discharged home  Days before yesterday however had covid-19 test and had to [facility] for remdesivir therapy.   Barbara [name] is  a 79 y.o. female with medical history significant for ESRD on HD MWF, hyperlipidemia, type 2 diabetes, COPD, asthma, GERD, bladder cancer, and iron deficiency anemia who presents for concerns of acute GI bleed and anemia.  She was then discharged to [facility].  Reportedly she had 2 days of coffee-ground emesis which the patient does not recall.  Also had pulled out her PEG tube.  Facility noted her to have hemoglobin of 5 and sent her to ER for further evaluation.  ED Course: She was afebrile, normotensive and required 2 L on room air. WBC of 19.7 from prior of 13.2.  Hemoglobin of 4.8 with a baseline of around 8-9. Sodium of 133, glucose of 121, creatinine of 4.37 from prior of 7.6 for a week ago. Per ED physician Dr.  Archie Balboa patient had a grossly melanotic stool but no FOBT documented in Epic.   Patient was started on IV Protonix drip and has blood transfusion pending but is having difficulty with peripheral access.  Hospital Course:  Pt has been improving with dialysis and hemoglobin has been stable, she was started on solumedrol / supplemental oxygen fr her acute hypoxic respiratory failure which has resolved. Pt's anemia after EGD and transfusion is stable. Pt was seen by me from 3/17 till today and nephrology agrees that pt is stable and is to continue dialysis at her local dialysis center here in [place].Pt's peg tube was replaced and tube feeds were restarted . Pt was doing well and was ready for discharge but d/c covid-19 was found to be positive and pt was started on remdesivir which was discontinued the next day because of loss of iv access and that evening we were told that she does not have covid the test was wrong.Pt also developed agitation and was refusing care and was uncooperative and confused and neurology was consulted and pt has been stable and workup so far remains negative. Procedures: ESOPHAGOGASTRODUODENOSCOPY (EGD) - 09/28/19 PEG tube placement 09/20/19  Consultations:  Nephrology.  Neurology  Gastroenterology  Pulmonary critical care  Discharge Exam: Vitals:   10/10/19 0805 10/10/19 1536  BP: (!) 156/65 (!) 175/70  Pulse: 84 80  Resp: (!) 21 (!) 21  Temp:  97.6 F (36.4 C)  SpO2: 97% 97%   General exam: Appears calm and comfortable  Respiratory system: Clear to auscultation. Respiratory effort normal. Cardiovascular system: S1 & S2 heard, RRR. No JVD, murmurs, rubs, gallops or clicks. No pedal edema. Gastrointestinal system: Abdomen is nondistended, soft and nontender. No organomegaly or masses felt. Normal bowel sounds heard. Central nervous system: Alert and oriented. No focal neurological deficits. Extremities: Symmetric 5 x 5 power. Skin: No rashes, lesions or  ulcers Psychiatry: Judgement and insight appear normal. Mood & affect appropriate.   Discharge Instructions Discharge Instructions    Call MD for:  difficulty breathing, headache or visual disturbances   Complete by: As directed    Call MD for:  difficulty breathing, headache or visual disturbances   Complete by: As directed    Call MD for:  extreme fatigue   Complete by: As directed    Call MD for:  hives   Complete by: As directed    Call MD for:  hives   Complete by: As directed    Call MD for:  persistant dizziness or light-headedness   Complete by: As directed    Call MD for:  persistant nausea and vomiting   Complete by: As directed    Call MD for:  persistant nausea and vomiting   Complete by: As directed    Call MD for:  severe uncontrolled pain   Complete by: As directed    Call MD for:  severe uncontrolled pain   Complete by: As directed    Call MD for:  temperature >100.4   Complete by: As directed    Call MD for:  temperature >100.4   Complete by: As directed    Diet - low sodium heart healthy   Complete by: As directed    Diet - low sodium heart healthy   Complete by: As directed    AND RENAL DIET.   Diet - low sodium heart healthy   Complete by: As directed    Pt is npo .   Discharge instructions   Complete by: As directed    Follow up with GI as well for PEG tube and removal plan.   Increase activity slowly   Complete by: As directed    Increase activity slowly   Complete by: As directed    Increase activity slowly   Complete by: As directed       Allergies  Allergen Reactions  . Glipizide Er Other (See Comments)    Hypoglycemia   . Percocet [Oxycodone-Acetaminophen] Hives    Contact information for follow-up providers    Schedule an appointment as soon as possible for a visit with Baxter Hire, MD.   Specialty: Internal Medicine Contact information: [place]  [id number] [phone]            Contact  information for after-discharge care    Destination    [facility] Preferred SNF.   Service: Skilled Nursing Contact information: [address] [place] [phone]                 The results of significant diagnostics from this hospitalization (including imaging, microbiology, ancillary and laboratory) are listed below for reference.    Significant Diagnostic Studies: EEG  Result Date: 09/13/2019 Lora Havens, MD     09/13/2019  5:50 PM Patient Name: Barbara [name] MRN: [id number] Epilepsy Attending: Lora Havens Referring Physician/Provider: Dr Karena Addison Aroor Date: 09/13/2019 Duration: 29.14 mins Patient history: 79 y.o.femalewith multiple medical problems including ESRD on hemodialysis who was admitted 2/16 for left total hip arthroplasty.Neurology reconsulted for worsening speech and mental status. Level of alertness: awake AEDs during EEG study: Vimpat Technical aspects: This EEG study was done with scalp electrodes positioned according to the 10-20 International system of electrode placement. Electrical activity was acquired at a sampling rate of 500Hz  and reviewed with a high frequency filter of 70Hz  and a low frequency filter of 1Hz . EEG data were recorded continuously and digitally stored. DESCRIPTION: When patient appeared to be resting/drowsy, EEG showed continuous generalized 3-6hz  theta-delta slowing. When patient was awake/stimulated, EEG showed triphasic waves, generalized, maximal bifrontal at times at 1-1.5hz . Sharp transients were seen in left frontocentral region. Hyperventilation and photic stimulation were not performed. ABNORMALITY - Continuous slow, generalized - Triphasic waves, generalized, maximal bifrontal IMPRESSION: This study is suggestive of moderate to severe diffuse encephalopathy, non specific to etiology but could be secondary to toxic-metabolic causes. No seizures or definite  epileptiform discharges were  seen throughout the recording. Lora Havens   DG Chest 1 View  Result Date: 09/17/2019 CLINICAL DATA:  Feeding tube placement EXAM: CHEST  1 VIEW COMPARISON:  09/17/2019 FINDINGS: 0402 hours. The cardio pericardial silhouette is enlarged. There is  pulmonary vascular congestion without overt pulmonary edema. Interstitial markings are diffusely coarsened with chronic features. Prominent skin fold noted over the left hemithorax. Feeding tube is coiled in the esophagus with the tip of the feeding tube at the esophagogastric junction. Telemetry leads overlie the chest. IMPRESSION: A relatively large length of the feeding tube is coiled in the esophagus with the tip at the esophagogastric junction. Given the large amount of redundant tubing in the esophagus, this should be pulled back with repeat placement. These results will be called to the ordering clinician or representative by the Radiologist Assistant, and communication documented in the PACS or zVision Dashboard. Electronically Signed   By: Misty Stanley M.D.   On: 09/17/2019 04:26   DG Chest 1 View  Result Date: 09/17/2019 CLINICAL DATA:  Enteric catheter placement, patient pulled on NG tube EXAM: CHEST  1 VIEW COMPARISON:  09/17/2019 FINDINGS: Single frontal view of the chest demonstrates a weighted tip of an enteric feeding catheter overlying the gastroesophageal junction. Cardiac silhouette is stable. Chronic central vascular congestion and diffuse interstitial prominence unchanged. Stable patchy consolidation at the lung bases. No effusion or pneumothorax. Vascular stent unchanged. IMPRESSION: 1. Enteric catheter overlying gastroesophageal junction. 2. Stable vascular congestion and bibasilar consolidation. Electronically Signed   By: Randa Ngo M.D.   On: 09/17/2019 03:09   DG Chest 1 View  Result Date: 09/17/2019 CLINICAL DATA:  Enteric catheter placement, previous abnormal chest x-ray EXAM: CHEST  1 VIEW COMPARISON:  09/13/2019 FINDINGS:  Frontal view of the chest was obtained, with the patient markedly rotated to the right. Weighted tip of an enteric catheter overlies the distal thoracic esophagus. The tip of the catheter is at least 4 cm from the gastroesophageal junction. The cardiac silhouette is enlarged but stable. There is persistent central vascular congestion. Patchy consolidation at the lung bases slightly improved. Evaluation of the right lung base limited due to patient rotation. No large effusion or pneumothorax. Stable left-sided vascular stent. IMPRESSION: 1. Enteric catheter as above. Recommended advancing at least 4 cm to ensure intragastric placement. 2. Central vascular congestion and patchy bibasilar consolidation, slightly improved since prior study. Evaluation limited by positioning. Electronically Signed   By: Randa Ngo M.D.   On: 09/17/2019 00:30   CT ANGIO CHEST PE W OR WO CONTRAST  Result Date: 10/03/2019 CLINICAL DATA:  Respiratory failure. EXAM: CT ANGIOGRAPHY CHEST WITH CONTRAST TECHNIQUE: Multidetector CT imaging of the chest was performed using the standard protocol during bolus administration of intravenous contrast. Multiplanar CT image reconstructions and MIPs were obtained to evaluate the vascular anatomy. CONTRAST:  52m OMNIPAQUE IOHEXOL 350 MG/ML SOLN COMPARISON:  August 24, 2019. FINDINGS: Cardiovascular: Satisfactory opacification of the pulmonary arteries to the segmental level. No evidence of pulmonary embolism. Mild cardiomegaly. Atherosclerosis of thoracic aorta is noted without aneurysm or dissection. Patent left brachiocephalic vein stent is noted. No pericardial effusion. Mediastinum/Nodes: Small sliding-type hiatal hernia. No significant adenopathy is noted. Thyroid gland is unremarkable. Lungs/Pleura: Small left pleural effusion is noted. Mild bilateral posterior basilar subsegmental atelectasis is noted. Possible bilateral pulmonary edema is noted in both upper lobes. Upper Abdomen: No acute  abnormality. Musculoskeletal: No chest wall abnormality. No acute or significant osseous findings. Review of the MIP images confirms the above findings. IMPRESSION: 1. No definite evidence of pulmonary embolus. 2. Small left pleural effusion is noted with mild bilateral posterior basilar subsegmental atelectasis. 3. Possible bilateral pulmonary edema is noted in both upper lobes. 4. Small sliding-type hiatal hernia. Aortic Atherosclerosis (ICD10-I70.0).  Electronically Signed   By: Marijo Conception M.D.   On: 10/03/2019 12:48   US Venous Img Lower Bilateral (DVT)  Result Date: 10/03/2019 CLINICAL DATA:  Bilateral lower extremity pain. History of malignancy. Evaluate for DVT. EXAM: BILATERAL LOWER EXTREMITY VENOUS DOPPLER ULTRASOUND TECHNIQUE: Gray-scale sonography with graded compression, as well as color Doppler and duplex ultrasound were performed to evaluate the lower extremity deep venous systems from the level of the common femoral vein and including the common femoral, femoral, profunda femoral, popliteal and calf veins including the posterior tibial, peroneal and gastrocnemius veins when visible. The superficial great saphenous vein was also interrogated. Spectral Doppler was utilized to evaluate flow at rest and with distal augmentation maneuvers in the common femoral, femoral and popliteal veins. COMPARISON:  None. FINDINGS: RIGHT LOWER EXTREMITY Common Femoral Vein: No evidence of thrombus. Normal compressibility, respiratory phasicity and response to augmentation. Saphenofemoral Junction: No evidence of thrombus. Normal compressibility and flow on color Doppler imaging. Profunda Femoral Vein: No evidence of thrombus. Normal compressibility and flow on color Doppler imaging. Femoral Vein: No evidence of thrombus. Normal compressibility, respiratory phasicity and response to augmentation. Popliteal Vein: No evidence of thrombus. Normal compressibility, respiratory phasicity and response to augmentation.  Calf Veins: No evidence of thrombus. Normal compressibility and flow on color Doppler imaging. Superficial Great Saphenous Vein: No evidence of thrombus. Normal compressibility. Venous Reflux:  None. Other Findings:  None. LEFT LOWER EXTREMITY Common Femoral Vein: No evidence of thrombus. Normal compressibility, respiratory phasicity and response to augmentation. Saphenofemoral Junction: No evidence of thrombus. Normal compressibility and flow on color Doppler imaging. Profunda Femoral Vein: No evidence of thrombus. Normal compressibility and flow on color Doppler imaging. Femoral Vein: No evidence of thrombus. Normal compressibility, respiratory phasicity and response to augmentation. Popliteal Vein: No evidence of thrombus. Normal compressibility, respiratory phasicity and response to augmentation. Calf Veins: No evidence of thrombus. Normal compressibility and flow on color Doppler imaging. Superficial Great Saphenous Vein: No evidence of thrombus. Normal compressibility. Venous Reflux:  None. Other Findings:  None. IMPRESSION: No evidence of DVT within either lower extremity. Electronically Signed   By: Sandi Mariscal M.D.   On: 10/03/2019 11:35   DG ABDOMEN PEG TUBE LOCATION  Result Date: 09/28/2019 CLINICAL DATA:  Peg tube location. EXAM: ABDOMEN - 1 VIEW COMPARISON:  09/13/2019 FINDINGS: A gastrostomy tube projects over the gastric bubble. The bowel gas pattern is nonobstructive. Extensive phleboliths project over the patient's pelvis. The patient is status post prior total hip arthroplasty on the left. IMPRESSION: Gastrostomy tube projects over the stomach. Electronically Signed   By: Constance Holster M.D.   On: 09/28/2019 01:41   DG Chest Port 1 View  Result Date: 10/02/2019 CLINICAL DATA:  Shortness of breath. Concern for GI bleed. EXAM: PORTABLE CHEST 1 VIEW COMPARISON:  Radiograph yesterday, multiple prior exams. CT 08/24/2019 FINDINGS: Stable cardiomegaly. Unchanged mediastinal contours. Vascular  stent in the region of the left subclavian unchanged. Unchanged elevation of left hemidiaphragm. Diffuse interstitial thickening consistent with pulmonary edema, unchanged. Possible trace pleural effusions. Subsegmental opacities at the left lung base. No pneumothorax. Patient's chin obscures the apices. Vascular stent in the left axilla and upper arm, partially included. IMPRESSION: Unchanged cardiomegaly and pulmonary edema since yesterday. Possible trace pleural effusions. Electronically Signed   By: Keith Rake M.D.   On: 10/02/2019 19:46   DG Chest Port 1 View  Result Date: 10/01/2019 CLINICAL DATA:  Short of breath. EXAM: PORTABLE CHEST 1 VIEW COMPARISON:  09/28/2019 FINDINGS: Cardiac  silhouette borderline enlarged. No mediastinal or hilar masses. Left sided stent, which appears to extend from the brachiocephalic to the proximal left subclavian vein, stable. Lungs demonstrate vascular congestion, interstitial thickening and mild hazy opacity at the bases, left greater than right. Suspect small pleural effusions. No pneumothorax. Skeletal structures are grossly intact. IMPRESSION: 1. Findings consistent with interstitial pulmonary edema with small pleural effusions. Electronically Signed   By: Lajean Manes M.D.   On: 10/01/2019 12:48   DG Chest Port 1 View  Result Date: 09/28/2019 CLINICAL DATA:  Central line clogged, no blood return. Concern for line placement in lung. EXAM: PORTABLE CHEST 1 VIEW COMPARISON:  Chest x-ray from earlier same day FINDINGS: RIGHT IJ line is stable in position with tip projected over the RIGHT lung apex, possibly within a small branch of the brachiocephalic or subclavian vein. Or a heart size and mediastinal contours are stable. Stable blunting of the LEFT costophrenic angle, atelectasis versus small pleural effusion. No new lung findings. No pneumothorax is seen. IMPRESSION: 1. Stable position of the RIGHT IJ line with tip projected over the RIGHT lung apex, possibly  within a small branch of the brachiocephalic or subclavian vein. 2. No pneumothorax seen. 3. Probable atelectasis and/or small pleural effusion at the LEFT lung base. Electronically Signed   By: Franki Cabot M.D.   On: 09/28/2019 15:14   DG Chest Port 1 View  Result Date: 09/28/2019 CLINICAL DATA:  Central line placement EXAM: PORTABLE CHEST 1 VIEW COMPARISON:  September 22, 2019 FINDINGS: There is a right-sided central venous catheter with tip projecting over the right lung apex. This may be in the region of the distal right internal jugular vein. Exact positioning is difficult to determine on this study. A stent projects over the patient's upper mediastinum. The heart size is stable. There is stable blunting of the left costophrenic angle. There is no pneumothorax. IMPRESSION: 1. Right-sided central venous catheter as above.  No pneumothorax. 2. Relatively stable appearance of the chest when compared to prior x-ray. Electronically Signed   By: Constance Holster M.D.   On: 09/28/2019 01:40   DG Chest Port 1 View  Result Date: 09/22/2019 CLINICAL DATA:  79 year old female with history of fever. EXAM: PORTABLE CHEST 1 VIEW COMPARISON:  Chest x-ray 09/17/2019. FINDINGS: Previously noted feeding tube has been removed. Vascular stents projecting over the left subclavian and left axillary regions. Lung volumes are low. No consolidative airspace disease. No pleural effusions. No suspicious appearing pulmonary nodules or masses are noted. No pneumothorax. No evidence of pulmonary edema. Mild cardiomegaly. Upper mediastinal contours are within normal limits. Aortic atherosclerosis. IMPRESSION: 1. Postoperative changes and support apparatus, as above. 2. Low lung volumes without radiographic evidence of acute cardiopulmonary disease. 3. Mild cardiomegaly. 4. Aortic atherosclerosis. Electronically Signed   By: Vinnie Langton M.D.   On: 09/22/2019 14:14   DG Chest Port 1 View  Result Date: 09/13/2019 CLINICAL DATA:   Shortness of breath. EXAM: PORTABLE CHEST 1 VIEW COMPARISON:  Radiographs 09/13/2019 and 09/06/2019. CT 08/24/2019. FINDINGS: 1435 hours. The heart size and mediastinal contours are stable with a stable left subclavian venous stent. There are persistent low lung volumes with worsening right-greater-than-left airspace opacities, suspicious for pneumonia, possibly on the basis of aspiration. No edema or significant pleural effusion. The bones appear unchanged. IMPRESSION: Worsening right-greater-than-left basilar airspace opacities suspicious for pneumonia, possibly on the basis of aspiration. Electronically Signed   By: Richardean Sale M.D.   On: 09/13/2019 16:56   DG Chest  Port 1 View  Result Date: 09/13/2019 CLINICAL DATA:  NG tube placement EXAM: PORTABLE CHEST 1 VIEW COMPARISON:  09/06/2019 FINDINGS: Mild cardiomegaly. Left brachiocephalic and axillary vascular stents. No esophagogastric tube or other support apparatus are identified internal to the patient. Mild, diffuse interstitial pulmonary opacity. IMPRESSION: 1. No esophagogastric tube or other support apparatus identified internal to the patient, presumably malpositioned and coiled within the oropharynx. 2. Mild cardiomegaly and mild, diffuse interstitial pulmonary opacity, unchanged compared to prior examination. No new airspace opacity. These results will be called to the ordering clinician or representative by the Radiologist Assistant, and communication documented in the PACS or zVision Dashboard. Electronically Signed   By: Eddie Candle M.D.   On: 09/13/2019 09:17   DG Abd Portable 1V  Result Date: 09/13/2019 CLINICAL DATA:  Nasogastric tube placement. EXAM: PORTABLE ABDOMEN - 1 VIEW COMPARISON:  September 09, 2019. FINDINGS: The bowel gas pattern is normal. Nasogastric tube is not visualized. No radio-opaque calculi or other significant radiographic abnormality are seen. IMPRESSION: Nasogastric tube is not visualized. No evidence of bowel  obstruction or ileus. Electronically Signed   By: Marijo Conception M.D.   On: 09/13/2019 10:34   DG Loyce Dys Tube Plc W/Fl W/Rad  Result Date: 09/14/2019 CLINICAL DATA:  Dobbhoff tube needed. EXAM: NASO G TUBE PLACEMENT WITH FL AND WITH RAD CONTRAST:  None. FLUOROSCOPY TIME:  Fluoroscopy Time:  1 minutes 0 seconds Radiation Exposure Index (if provided by the fluoroscopic device): 14.1 mGy Number of Acquired Spot Images: 1 COMPARISON:  Portable chest and abdomen 09/13/2019 FINDINGS: Under fluoroscopic guidance a Dobbhoff tube was placed with its tip placed to the level of the distal stomach. No complications. Tube was secured to the nose with tape and patient sent back to the floor in stable condition. IMPRESSION: Successful fluoroscopically directed Dobbhoff tube placement. Electronically Signed   By: Marcello Moores  Register   On: 09/14/2019 10:01    Microbiology: Recent Results (from the past 240 hour(s))  SARS CORONAVIRUS 2 (TAT 6-24 HRS) Nasopharyngeal Nasopharyngeal Swab     Status: None   Collection Time: 10/01/19 12:28 PM   Specimen: Nasopharyngeal Swab  Result Value Ref Range Status   SARS Coronavirus 2 NEGATIVE NEGATIVE Final    Comment: (NOTE) SARS-CoV-2 target nucleic acids are NOT DETECTED. The SARS-CoV-2 RNA is generally detectable in upper and lower respiratory specimens during the acute phase of infection. Negative results do not preclude SARS-CoV-2 infection, do not rule out co-infections with other pathogens, and should not be used as the sole basis for treatment or other patient management decisions. Negative results must be combined with clinical observations, patient history, and epidemiological information. The expected result is Negative. Fact Sheet for Patients: SugarRoll.be Fact Sheet for Healthcare Providers: https://www.woods-mathews.com/ This test is not yet approved or cleared by the [place] FDA and  has been authorized for  detection and/or diagnosis of SARS-CoV-2 by FDA under an Emergency Use Authorization (EUA). This EUA will remain  in effect (meaning this test can be used) for the duration of the COVID-19 declaration under Section 56 4(b)(1) of the Act, 21 U.S.C. section 360bbb-3(b)(1), unless the authorization is terminated or revoked sooner. Performed at [facility], [place] [address] [place], [place] [address]   SARS CORONAVIRUS 2 (TAT 6-24 HRS) Nasopharyngeal Nasopharyngeal Swab     Status: None   Collection Time: 10/05/19  3:11 PM   Specimen: Nasopharyngeal Swab  Result Value Ref Range Status   SARS Coronavirus 2 NEGATIVE NEGATIVE  Corrected    Comment: RESULT CALLED TO, READ BACK BY AND VERIFIED WITH: THIS IS A CORRECTED REPORT. PREVIOUSLY CALLED REPORT WAS POSITIVE. CALLED TO [facility] RN 10/06/19 1755 JDW Performed at [facility] [address] [place], [place] [address] CORRECTED ON 03/19 AT 5003: PREVIOUSLY REPORTED AS POSITIVE RESULT CALLED TO, READ BACK BY AND VERIFIED WITH: RN  TINA MICHAEL AT [place] ON 10/06/2019   Respiratory Panel by RT PCR (Flu A&B, Covid) - Nasopharyngeal Swab     Status: None   Collection Time: 10/10/19  1:20 PM   Specimen: Nasopharyngeal Swab  Result Value Ref Range Status   SARS Coronavirus 2 by RT PCR NEGATIVE NEGATIVE Final    Comment: (NOTE) SARS-CoV-2 target nucleic acids are NOT DETECTED. The SARS-CoV-2 RNA is generally detectable in upper respiratoy specimens during the acute phase of infection. The lowest concentration of SARS-CoV-2 viral copies this assay can detect is 131 copies/mL. A negative result does not preclude SARS-Cov-2 infection and should not be used as the sole basis for treatment or other patient management decisions. A negative result may occur with  improper specimen collection/handling, submission of specimen other than nasopharyngeal swab, presence of viral mutation(s) within the areas targeted by this  assay, and inadequate number of viral copies (<131 copies/mL). A negative result must be combined with clinical observations, patient history, and epidemiological information. The expected result is Negative. Fact Sheet for Patients:  PinkCheek.be Fact Sheet for Healthcare Providers:  GravelBags.it This test is not yet ap proved or cleared by the [place] FDA and  has been authorized for detection and/or diagnosis of SARS-CoV-2 by FDA under an Emergency Use Authorization (EUA). This EUA will remain  in effect (meaning this test can be used) for the duration of the COVID-19 declaration under Section 564(b)(1) of the Act, 21 U.S.C. section 360bbb-3(b)(1), unless the authorization is terminated or revoked sooner.    Influenza A by PCR NEGATIVE NEGATIVE Final   Influenza B by PCR NEGATIVE NEGATIVE Final    Comment: (NOTE) The Xpert Xpress SARS-CoV-2/FLU/RSV assay is intended as an aid in  the diagnosis of influenza from Nasopharyngeal swab specimens and  should not be used as a sole basis for treatment. Nasal washings and  aspirates are unacceptable for Xpert Xpress SARS-CoV-2/FLU/RSV  testing. Fact Sheet for Patients: PinkCheek.be Fact Sheet for Healthcare Providers: GravelBags.it This test is not yet approved or cleared by the [place] FDA and  has been authorized for detection and/or diagnosis of SARS-CoV-2 by  FDA under an Emergency Use Authorization (EUA). This EUA will remain  in effect (meaning this test can be used) for the duration of the  Covid-19 declaration under Section 564(b)(1) of the Act, 21  U.S.C. section 360bbb-3(b)(1), unless the authorization is  terminated or revoked. Performed at [facility], [address] [place], [place] [address]      Labs: Basic Metabolic Panel: Recent Labs  Lab 10/04/19 0805 10/04/19 0805  10/05/19 1455 10/05/19 1455 10/06/19 0613 10/07/19 1142 10/08/19 0759 10/09/19 0046 10/10/19 1054  NA 139   < > 140   < > 140 142 141 142 140  K 3.9   < > 3.5   < > 3.6 3.2* 3.3* 3.4* 3.7  CL 95*   < > 96*   < > 96* 96* 98 99 96*  CO2 27   < > 27   < > 26 27 26 27 27   GLUCOSE 142*   < >  153*   < > 175* 134* 171* 139* 134*  BUN 91*   < > 81*   < > 108* 156* 88* 115* 104*  CREATININE 7.02*   < > 5.68*   < > 6.92* 8.68* 6.15* 7.25* 8.54*  CALCIUM 9.0   < > 9.0   < > 9.0 8.8* 8.9 8.8* 8.7*  PHOS 3.3  --  3.2  --  4.1 4.3  --  4.7*  --    < > = values in this interval not displayed.   Liver Function Tests: Recent Labs  Lab 10/04/19 0805 10/05/19 1455 10/06/19 0613 10/07/19 1142 10/09/19 0046  ALBUMIN 2.7* 3.0* 3.2* 3.1* 3.1*   No results for input(s): LIPASE, AMYLASE in the last 168 hours. No results for input(s): AMMONIA in the last 168 hours. CBC: Recent Labs  Lab 10/06/19 0613 10/06/19 1332 10/07/19 1142 10/08/19 0759 10/10/19 1054  WBC 13.2* 15.3* 20.7* 18.5* 16.0*  HGB 9.0* 9.3* 9.8* 10.0* 9.9*  HCT 28.7* 29.9* 30.8* 31.6* 30.7*  MCV 92.6 93.4 93.3 92.1 90.6  PLT 379 379 429* 359 267   Cardiac Enzymes: No results for input(s): CKTOTAL, CKMB, CKMBINDEX, TROPONINI in the last 168 hours. BNP: BNP (last 3 results) No results for input(s): BNP in the last 8760 hours.  ProBNP (last 3 results) No results for input(s): PROBNP in the last 8760 hours.  CBG: Recent Labs  Lab 10/09/19 1935 10/10/19 0040 10/10/19 0324 10/10/19 0802 10/10/19 1121  GLUCAP 119* 164* 148* 138* 147*   Signed:  Para Skeans MD.  [facility] 10/10/2019, 4:18 PM

## 2019-10-10 NOTE — TOC Transition Note (Signed)
Transition of Care [facility]) - CM/SW Discharge Note   Patient Details  Name: Barbara [name] MRN: [id number] Date of Birth: 14-Feb-1941  [facility]) CM/SW Contact:  Shade Flood, LCSW Phone Number: 10/10/2019, 3:13 PM   Clinical Narrative:     Pt medically stable for dc today per MD. Repeat Covid test is negative. Met with pt and her husband at bedside. Plan remains for return to [facility] for short term rehab.   Updated Tina at [facility] and they can accept pt today. DC clinical sent electronically. RN will call report.  DC envelope will be in pt's chart rack. Will arrange EMS for transport.  There are no other TOC needs for dc.  Final next level of care: Skilled Nursing Facility Barriers to Discharge: Barriers Resolved   Patient Goals and CMS Choice        Discharge Placement   Existing PASRR number confirmed : 10/09/19          Patient chooses bed at: [facility] Patient to be transferred to facility by: EMS Name of family member notified: Medea Deines (husband) Patient and family notified of of transfer: 10/10/19  Discharge Plan and Services                                     Social Determinants of Health (SDOH) Interventions     Readmission Risk Interventions Readmission Risk Prevention Plan 09/29/2019  Transportation Screening Complete  Medication Review (RN Care Manager) Complete  Palliative Care Screening Not Applicable  Some recent data might be hidden

## 2019-10-10 NOTE — Progress Notes (Signed)
Subjective: Patient awake and alert today.  Mental status improved.  Conversation appropriate  Objective: Current vital signs: BP (!) 156/65 (BP Location: Right Arm)   Pulse 84   Temp 97.9 F (36.6 C) (Axillary)   Resp (!) 21   Ht 5' 6"  (1.676 m)   Wt 76.4 kg   SpO2 97%   BMI 27.19 kg/m  Vital signs in last 24 hours: Temp:  [97.9 F (36.6 C)-98.5 F (36.9 C)] 97.9 F (36.6 C) (03/22 2340) Pulse Rate:  [75-87] 84 (03/23 0805) Resp:  [16-21] 21 (03/23 0805) BP: (138-164)/(55-96) 156/65 (03/23 0805) SpO2:  [96 %-98 %] 97 % (03/23 0805) Weight:  [76.4 kg] 76.4 kg (03/23 0500)  Intake/Output from previous day: 03/22 0701 - 03/23 0700 In: 0.6 [I.V.:0.6] Out: -  Intake/Output this shift: No intake/output data recorded. Nutritional status:  Diet Order            Diet NPO time specified  Diet effective now              Neurologic Exam: Mental Status: Alert, oriented to name.  Can not tell me where she is.  Speech fluent without evidence of aphasia.  Dysarthria improved.  Conversation appropriate.  Able to follow simple commands without difficulty. Cranial Nerves: II: Visual fields grossly normal III,IV, VI: extra-ocular motions intact with a right exotropia V,VII: smile symmetric, facial light touch sensation normal bilaterally VIII: hearing normal bilaterally IX,X: gag reflex present XI: bilateral shoulder shrug XII: midline tongue extension Motor: Moves all extremities against gravity   Lab Results: Basic Metabolic Panel: Recent Labs  Lab 10/04/19 0805 10/04/19 0805 10/05/19 1455 10/05/19 1455 10/06/19 0613 10/06/19 0613 10/07/19 1142 10/08/19 0759 10/09/19 0046  NA 139   < > 140  --  140  --  142 141 142  K 3.9   < > 3.5  --  3.6  --  3.2* 3.3* 3.4*  CL 95*   < > 96*  --  96*  --  96* 98 99  CO2 27   < > 27  --  26  --  27 26 27   GLUCOSE 142*   < > 153*  --  175*  --  134* 171* 139*  BUN 91*   < > 81*  --  108*  --  156* 88* 115*  CREATININE 7.02*    < > 5.68*  --  6.92*  --  8.68* 6.15* 7.25*  CALCIUM 9.0   < > 9.0   < > 9.0   < > 8.8* 8.9 8.8*  PHOS 3.3  --  3.2  --  4.1  --  4.3  --  4.7*   < > = values in this interval not displayed.    Liver Function Tests: Recent Labs  Lab 10/04/19 0805 10/05/19 1455 10/06/19 0613 10/07/19 1142 10/09/19 0046  ALBUMIN 2.7* 3.0* 3.2* 3.1* 3.1*   No results for input(s): LIPASE, AMYLASE in the last 168 hours. No results for input(s): AMMONIA in the last 168 hours.  CBC: Recent Labs  Lab 10/05/19 1455 10/06/19 0613 10/06/19 1332 10/07/19 1142 10/08/19 0759  WBC 13.1* 13.2* 15.3* 20.7* 18.5*  HGB 8.1* 9.0* 9.3* 9.8* 10.0*  HCT 26.0* 28.7* 29.9* 30.8* 31.6*  MCV 92.2 92.6 93.4 93.3 92.1  PLT 320 379 379 429* 359    Cardiac Enzymes: No results for input(s): CKTOTAL, CKMB, CKMBINDEX, TROPONINI in the last 168 hours.  Lipid Panel: No results for input(s): CHOL, TRIG, HDL, CHOLHDL,  VLDL, LDLCALC in the last 168 hours.  CBG: Recent Labs  Lab 10/09/19 1611 10/09/19 1935 10/10/19 0040 10/10/19 0324 10/10/19 0802  GLUCAP 111* 119* 164* 148* 138*    Microbiology: Results for orders placed or performed during the hospital encounter of 09/27/19  SARS CORONAVIRUS 2 (TAT 6-24 HRS) Nasopharyngeal Nasopharyngeal Swab     Status: None   Collection Time: 09/28/19  1:40 AM   Specimen: Nasopharyngeal Swab  Result Value Ref Range Status   SARS Coronavirus 2 NEGATIVE NEGATIVE Final    Comment: (NOTE) SARS-CoV-2 target nucleic acids are NOT DETECTED. The SARS-CoV-2 RNA is generally detectable in upper and lower respiratory specimens during the acute phase of infection. Negative results do not preclude SARS-CoV-2 infection, do not rule out co-infections with other pathogens, and should not be used as the sole basis for treatment or other patient management decisions. Negative results must be combined with clinical observations, patient history, and epidemiological information. The  expected result is Negative. Fact Sheet for Patients: SugarRoll.be Fact Sheet for Healthcare Providers: https://www.woods-mathews.com/ This test is not yet approved or cleared by the [place] FDA and  has been authorized for detection and/or diagnosis of SARS-CoV-2 by FDA under an Emergency Use Authorization (EUA). This EUA will remain  in effect (meaning this test can be used) for the duration of the COVID-19 declaration under Section 56 4(b)(1) of the Act, 21 U.S.C. section 360bbb-3(b)(1), unless the authorization is terminated or revoked sooner. Performed at [facility], [place] [address] [place], [place] [address]   MRSA PCR Screening     Status: None   Collection Time: 09/28/19  3:08 AM   Specimen: Nasal Mucosa; Nasopharyngeal  Result Value Ref Range Status   MRSA by PCR NEGATIVE NEGATIVE Final    Comment:        The GeneXpert MRSA Assay (FDA approved for NASAL specimens only), is one component of a comprehensive MRSA colonization surveillance program. It is not intended to diagnose MRSA infection nor to guide or monitor treatment for MRSA infections. Performed at [facility], [place], [place] [address]   SARS CORONAVIRUS 2 (TAT 6-24 HRS) Nasopharyngeal Nasopharyngeal Swab     Status: None   Collection Time: 10/01/19 12:28 PM   Specimen: Nasopharyngeal Swab  Result Value Ref Range Status   SARS Coronavirus 2 NEGATIVE NEGATIVE Final    Comment: (NOTE) SARS-CoV-2 target nucleic acids are NOT DETECTED. The SARS-CoV-2 RNA is generally detectable in upper and lower respiratory specimens during the acute phase of infection. Negative results do not preclude SARS-CoV-2 infection, do not rule out co-infections with other pathogens, and should not be used as the sole basis for treatment or other patient management decisions. Negative results must be combined with clinical observations, patient  history, and epidemiological information. The expected result is Negative. Fact Sheet for Patients: SugarRoll.be Fact Sheet for Healthcare Providers: https://www.woods-mathews.com/ This test is not yet approved or cleared by the [place] FDA and  has been authorized for detection and/or diagnosis of SARS-CoV-2 by FDA under an Emergency Use Authorization (EUA). This EUA will remain  in effect (meaning this test can be used) for the duration of the COVID-19 declaration under Section 56 4(b)(1) of the Act, 21 U.S.C. section 360bbb-3(b)(1), unless the authorization is terminated or revoked sooner. Performed at [facility], [place] [address] [place], [place] [address]   SARS CORONAVIRUS 2 (TAT 6-24 HRS) Nasopharyngeal Nasopharyngeal Swab     Status: None  Collection Time: 10/05/19  3:11 PM   Specimen: Nasopharyngeal Swab  Result Value Ref Range Status   SARS Coronavirus 2 NEGATIVE NEGATIVE Corrected    Comment: RESULT CALLED TO, READ BACK BY AND VERIFIED WITH: THIS IS A CORRECTED REPORT. PREVIOUSLY CALLED REPORT WAS POSITIVE. CALLED TO [facility] RN 10/06/19 1755 JDW Performed at [facility] [address], [place], [place] [address] CORRECTED ON 03/19 AT 7408: PREVIOUSLY REPORTED AS POSITIVE RESULT CALLED TO, READ BACK BY AND VERIFIED WITH: RN  TINA MICHAEL AT [place] ON 10/06/2019     Coagulation Studies: No results for input(s): LABPROT, INR in the last 72 hours.  Imaging: No results found.  Medications:  I have reviewed the patient's current medications. Scheduled: . B-complex with vitamin C  1 tablet Per Tube QHS  . Chlorhexidine Gluconate Cloth  6 each Topical Daily  . [START ON 10/11/2019] epoetin (EPOGEN/PROCRIT) injection  4,000 Units Intravenous Q M,W,F-HD  . feeding supplement (NEPRO CARB STEADY)  1,000 mL Per Tube Q24H  . feeding supplement (PRO-STAT SUGAR FREE 64)  30 mL Per Tube BID  . free  water  50 mL Per Tube 6 X Daily  . gabapentin  100 mg Per Tube Daily  . insulin aspart  0-6 Units Subcutaneous Q4H  . ipratropium  2 puff Inhalation Q6H  . lacosamide  100 mg Per Tube BID  . mouth rinse  15 mL Mouth Rinse BID  . methylPREDNISolone  4 mg Per Tube 4X daily taper  . mometasone-formoterol  2 puff Inhalation BID  . nutrition supplement (JUVEN)  1 packet Per Tube BID BM  . pantoprazole sodium  40 mg Per Tube BID    Assessment/Plan: 79 year old female with multiple medical problems admitted for GIB who has now developed an altered mental status.  Patient has had multiple episodes in the past when declined cognitively when experiencing medical stressors.  Last event was with last hospitalization in February.  Patient had not completely returned to baseline prior to this hospitalization.  Patient on Vimpat.  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  Time and treatment of underlying medical issues have been the only factors in the past that have led to cognitive improvement.  Patient now quite improved from initial evaluation.   Head CT pending  Recommendations: 1. Continue Vimpat at current dose 2. No further neurologic intervention is recommended at this time.  If further questions arise, please call or page at that time.  Thank you for allowing neurology to participate in the care of this patient.     LOS: 12 days   Alexis Goodell, MD Neurology [phone] 10/10/2019  9:55 AM

## 2019-10-11 ENCOUNTER — Other Ambulatory Visit: Payer: Self-pay

## 2019-10-11 ENCOUNTER — Emergency Department
Admission: EM | Admit: 2019-10-11 | Discharge: 2019-10-11 | Disposition: A | Discharge status: Skilled Nursing Facility | Payer: Medicare Other | Source: Home / Self Care | Attending: Emergency Medicine | Admitting: Emergency Medicine

## 2019-10-11 ENCOUNTER — Encounter: Payer: Self-pay | Admitting: Emergency Medicine

## 2019-10-11 DIAGNOSIS — J449 Chronic obstructive pulmonary disease, unspecified: Secondary | ICD-10-CM | POA: Insufficient documentation

## 2019-10-11 DIAGNOSIS — I5032 Chronic diastolic (congestive) heart failure: Secondary | ICD-10-CM | POA: Insufficient documentation

## 2019-10-11 DIAGNOSIS — Z992 Dependence on renal dialysis: Secondary | ICD-10-CM | POA: Insufficient documentation

## 2019-10-11 DIAGNOSIS — Z8551 Personal history of malignant neoplasm of bladder: Secondary | ICD-10-CM | POA: Insufficient documentation

## 2019-10-11 DIAGNOSIS — N186 End stage renal disease: Secondary | ICD-10-CM | POA: Insufficient documentation

## 2019-10-11 DIAGNOSIS — J45909 Unspecified asthma, uncomplicated: Secondary | ICD-10-CM | POA: Insufficient documentation

## 2019-10-11 DIAGNOSIS — E1122 Type 2 diabetes mellitus with diabetic chronic kidney disease: Secondary | ICD-10-CM | POA: Insufficient documentation

## 2019-10-11 DIAGNOSIS — I2510 Atherosclerotic heart disease of native coronary artery without angina pectoris: Secondary | ICD-10-CM | POA: Insufficient documentation

## 2019-10-11 DIAGNOSIS — I132 Hypertensive heart and chronic kidney disease with heart failure and with stage 5 chronic kidney disease, or end stage renal disease: Secondary | ICD-10-CM | POA: Insufficient documentation

## 2019-10-11 DIAGNOSIS — M25552 Pain in left hip: Secondary | ICD-10-CM | POA: Diagnosis not present

## 2019-10-11 DIAGNOSIS — S73005A Unspecified dislocation of left hip, initial encounter: Secondary | ICD-10-CM | POA: Diagnosis not present

## 2019-10-11 DIAGNOSIS — Z87891 Personal history of nicotine dependence: Secondary | ICD-10-CM | POA: Insufficient documentation

## 2019-10-11 LAB — CBC WITH DIFFERENTIAL/PLATELET
Abs Immature Granulocytes: 0.17 10*3/uL — ABNORMAL HIGH (ref 0.00–0.07)
Basophils Absolute: 0 10*3/uL (ref 0.0–0.1)
Basophils Relative: 0 %
Eosinophils Absolute: 0.3 10*3/uL (ref 0.0–0.5)
Eosinophils Relative: 2 %
HCT: 28.3 % — ABNORMAL LOW (ref 36.0–46.0)
Hemoglobin: 9.1 g/dL — ABNORMAL LOW (ref 12.0–15.0)
Immature Granulocytes: 1 %
Lymphocytes Relative: 7 %
Lymphs Abs: 1 10*3/uL (ref 0.7–4.0)
MCH: 29.5 pg (ref 26.0–34.0)
MCHC: 32.2 g/dL (ref 30.0–36.0)
MCV: 91.9 fL (ref 80.0–100.0)
Monocytes Absolute: 0.8 10*3/uL (ref 0.1–1.0)
Monocytes Relative: 5 %
Neutro Abs: 12.2 10*3/uL — ABNORMAL HIGH (ref 1.7–7.7)
Neutrophils Relative %: 85 %
Platelets: 281 10*3/uL (ref 150–400)
RBC: 3.08 MIL/uL — ABNORMAL LOW (ref 3.87–5.11)
RDW: 17.3 % — ABNORMAL HIGH (ref 11.5–15.5)
WBC: 14.4 10*3/uL — ABNORMAL HIGH (ref 4.0–10.5)
nRBC: 0 % (ref 0.0–0.2)

## 2019-10-11 LAB — COMPREHENSIVE METABOLIC PANEL
ALT: 17 U/L (ref 0–44)
AST: 17 U/L (ref 15–41)
Albumin: 3 g/dL — ABNORMAL LOW (ref 3.5–5.0)
Alkaline Phosphatase: 76 U/L (ref 38–126)
Anion gap: 15 (ref 5–15)
BUN: 112 mg/dL — ABNORMAL HIGH (ref 8–23)
CO2: 25 mmol/L (ref 22–32)
Calcium: 8.8 mg/dL — ABNORMAL LOW (ref 8.9–10.3)
Chloride: 100 mmol/L (ref 98–111)
Creatinine, Ser: 8 mg/dL — ABNORMAL HIGH (ref 0.44–1.00)
GFR calc Af Amer: 5 mL/min — ABNORMAL LOW (ref >60)
GFR calc non Af Amer: 4 mL/min — ABNORMAL LOW (ref >60)
Glucose, Bld: 136 mg/dL — ABNORMAL HIGH (ref 70–99)
Potassium: 3.3 mmol/L — ABNORMAL LOW (ref 3.5–5.1)
Sodium: 140 mmol/L (ref 135–145)
Total Bilirubin: 0.7 mg/dL (ref 0.3–1.2)
Total Protein: 7.1 g/dL (ref 6.5–8.1)

## 2019-10-11 LAB — BLOOD GAS, ARTERIAL
Acid-Base Excess: 7.8 mmol/L — ABNORMAL HIGH (ref 0.0–2.0)
Bicarbonate: 32 mmol/L — ABNORMAL HIGH (ref 20.0–28.0)
FIO2: 0.42
O2 Saturation: 92.6 %
Patient temperature: 37
pCO2 arterial: 42 mmHg (ref 32.0–48.0)
pH, Arterial: 7.49 — ABNORMAL HIGH (ref 7.350–7.450)
pO2, Arterial: 60 mmHg — ABNORMAL LOW (ref 83.0–108.0)

## 2019-10-11 LAB — TROPONIN I (HIGH SENSITIVITY): Troponin I (High Sensitivity): 58 ng/L — ABNORMAL HIGH (ref <18)

## 2019-10-11 MED ORDER — TRAMADOL HCL 50 MG PO TABS
50.0000 mg | ORAL_TABLET | Freq: Once | ORAL | Status: DC
  Filled 2019-10-11: qty 1

## 2019-10-11 MED ORDER — HALOPERIDOL 1 MG PO TABS: 1.0000 mg | ORAL_TABLET | Freq: Every morning | ORAL | 2 refills | Status: DC

## 2019-10-11 NOTE — ED Triage Notes (Signed)
Pt in via [facility] from dialysis, received approximately 30 minutes of treatment; per EMS, call out for "patient not acting herself."  Pt A/Ox3 upon arrival, NAD noted at this time.    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

## 2019-10-11 NOTE — Progress Notes (Signed)
IV team started a PIV on RUA and blood was drawn for labs from AV fistula which IV Team deaccessed from [place], held pressure for 15 minutes. NO s/s of bleeding noted. Primary RN at bedside, aware.

## 2019-10-11 NOTE — ED Notes (Signed)
Lab contacted for repeat troponin.

## 2019-10-11 NOTE — ED Provider Notes (Signed)
[facility] Emergency Department Provider Note       Time seen: ----------------------------------------- 8:16 AM on 10/11/2019 -----------------------------------------   I have reviewed the triage vital signs and the nursing notes.  HISTORY   Chief Complaint Altered Mental Status and Hip Pain    HPI Barbara [name] is a 79 y.o. female with a history of anemia, arthritis, chronic kidney disease on dialysis, COPD, diabetes, GERD who presents to the ED for altered mental status.  Reportedly she is on dialysis since she was 30 minutes into her treatment when EMS was called for her not acting like herself.  She is alert and oriented on arrival.  She denies any complaints.  Past Medical History:  Diagnosis Date  . Adult celiac disease   . Anemia associated with chronic renal failure 2017   blood transfusion last week 10/17  . Arthritis   . Asthma   . 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 Chronic kidney disease   . 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  dr. Candiss Norse and dr. Holley Raring follow her  . COPD (chronic obstructive pulmonary disease) ([place])   . Coronary artery disease   . Diabetes mellitus without complication ([place])   . Dialysis patient ([place])    Tues, Thurs, Sat  . Dyspnea    with exertion  . Elevated lipids   . GERD (gastroesophageal reflux disease)   . Hematuria   . Hypertension   . Lower back pain   . Neuropathy   . Oxygen dependent    at hs  . Personal history of chemotherapy 2017   bladder ca  . Personal history of radiation therapy 2017   bladder ca  . Sleep apnea    uses cpap  . Urinary obstruction 01/2016    Patient Active Problem List   Diagnosis Date Noted  . Acute GI bleeding 09/28/2019  . Acute respiratory failure with hypoxia ([place]) 09/28/2019  . Chronic diastolic CHF (congestive heart failure) ([place]) 09/28/2019  . Seizure ([place]) 09/28/2019  . PEG tube malfunction ([place]) 09/28/2019  . Sacral ulcer ([place]) 09/23/2019   . Dysphagia 09/19/2019  . Acute encephalopathy   . Goals of care, counseling/discussion   . Aspiration pneumonia ([place]) 09/11/2019  . Status post total hip replacement, left 09/05/2019  . Type II diabetes mellitus with renal manifestations ([place]) 09/05/2019  . ESRD on dialysis ([place]) 09/05/2019  . Bacteremia due to other bacteria   . Bacteroides fragilis infection   . Acute metabolic encephalopathy   . Somnolence   . Lobar pneumonia ([place]) 05/11/2019  . Dialysis patient ([place])   . Primary osteoarthritis of left hip 04/07/2019  . Metabolic acidosis 82/95/6213  . Complication from renal dialysis device 01/28/2018  . History of colonic polyps 07/30/2017  . Presence of cardiac and vascular implant and graft   . Fever   . Sepsis ([place])   . ESRD (end stage renal disease) ([place]) 02/01/2017  . Sepsis secondary to UTI ([place]) 01/24/2017  . Acute kidney injury ([place]) 11/19/2016  . Anemia, chronic renal failure, stage 4 (severe) ([place]) 09/17/2016  . Hypoglycemia 06/04/2016  . Hyperkalemia 05/12/2016  . Protein-calorie malnutrition, severe 05/05/2016  . Acute on chronic renal failure ([place]) 05/04/2016  . Convulsions ([place]) 04/17/2016  . Palliative care by specialist   . DNR (do not resuscitate) discussion   . C. difficile diarrhea 03/11/2016  . Anemia 03/11/2016  . Hypotension 03/11/2016  . Acidosis 03/11/2016  . Failure to thrive (child)  03/11/2016  . Weakness generalized 03/11/2016  . ARF (acute renal failure) ([place]) 03/04/2016  . 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 Absolute anemia 02/04/2016  . Airway hyperreactivity 02/04/2016  . Celiac disease 02/04/2016  . Gastric catarrh 02/04/2016  . Acid reflux 02/04/2016  . Combined fat and carbohydrate induced hyperlipemia 02/04/2016  . C. difficile colitis 01/22/2016  . Urinary obstruction 01/21/2016  . COPD (chronic obstructive pulmonary disease) ([place]) 01/21/2016  . Controlled type 2 diabetes mellitus with stage 4 chronic  kidney disease, with long-term current use of insulin ([place]) 01/21/2016  . Essential hypertension 01/21/2016  . Acute renal failure superimposed on stage 4 chronic kidney disease ([place]) 01/20/2016  . Leukocytosis 01/20/2016  . Anemia in chronic kidney disease 01/20/2016  . Arthritis of knee, degenerative 05/10/2015  . Knee strain 03/26/2015  . Other intervertebral disc displacement, lumbar region 03/04/2015  . Degenerative arthritis of lumbar spine 03/04/2015  . HNP (herniated nucleus pulposus), lumbar 03/04/2015  . Osteoarthritis of spine with radiculopathy, lumbar region 03/04/2015  . Injury of tendon of upper extremity 11/12/2014  . Atherosclerosis of abdominal aorta ([place]) 11/02/2014  . Chronic kidney disease, stage IV (severe) ([place]) 11/02/2014  . Obstructive apnea 11/02/2014  . Complete rotator cuff rupture of left shoulder 10/05/2014  . Arthritis of shoulder region, degenerative 10/05/2014  . CAD in native artery 12/08/2013  . Benign essential HTN 12/08/2013  . Type 2 diabetes mellitus ([place]) 12/08/2013    Past Surgical History:  Procedure Laterality Date  . A/V SHUNT INTERVENTION N/A 05/12/2018   Procedure: A/V SHUNT INTERVENTION;  Surgeon: Algernon Huxley, MD;  Location: [place] [facility];  Service: Cardiovascular;  Laterality: N/A;  . A/V SHUNT INTERVENTION Left 07/29/2018   Procedure: A/V SHUNT INTERVENTION;  Surgeon: Katha Cabal, MD;  Location: [place] [facility];  Service: Cardiovascular;  Laterality: Left;  . ABDOMINAL HYSTERECTOMY    . AV FISTULA PLACEMENT Left 02/17/2017   Procedure: INSERTION OF ARTERIOVENOUS (AV) GORE-TEX GRAFT ARM(BRACHIAL AXILLARY);  Surgeon: Katha Cabal, MD;  Location: [facility];  Service: Vascular;  Laterality: Left;  . CYSTOSCOPY W/ RETROGRADES Bilateral 02/17/2016   Procedure: CYSTOSCOPY WITH RETROGRADE PYELOGRAM;  Surgeon: Hollice Espy, MD;  Location: [facility];  Service: Urology;  Laterality: Bilateral;  . CYSTOSCOPY W/ RETROGRADES  Bilateral 10/12/2016   Procedure: CYSTOSCOPY WITH RETROGRADE PYELOGRAM;  Surgeon: Hollice Espy, MD;  Location: [facility];  Service: Urology;  Laterality: Bilateral;  . CYSTOSCOPY W/ RETROGRADES Bilateral 06/09/2017   Procedure: CYSTOSCOPY WITH RETROGRADE PYELOGRAM;  Surgeon: Hollice Espy, MD;  Location: [facility];  Service: Urology;  Laterality: Bilateral;  . CYSTOSCOPY W/ RETROGRADES Bilateral 10/07/2017   Procedure: CYSTOSCOPY WITH RETROGRADE PYELOGRAM;  Surgeon: Hollice Espy, MD;  Location: [facility];  Service: Urology;  Laterality: Bilateral;  . CYSTOSCOPY W/ URETERAL STENT PLACEMENT Left 05/12/2016   Procedure: CYSTOSCOPY WITH STENT REPLACEMENT;  Surgeon: Hollice Espy, MD;  Location: [facility];  Service: Urology;  Laterality: Left;  . CYSTOSCOPY W/ URETERAL STENT PLACEMENT Left 10/12/2016   Procedure: CYSTOSCOPY WITH STENT REPLACEMENT;  Surgeon: Hollice Espy, MD;  Location: [facility];  Service: Urology;  Laterality: Left;  . CYSTOSCOPY WITH BIOPSY N/A 06/09/2017   Procedure: CYSTOSCOPY WITH BLADDER BIOPSY;  Surgeon: Hollice Espy, MD;  Location: [facility];  Service: Urology;  Laterality: N/A;  . CYSTOSCOPY WITH STENT PLACEMENT Left 01/21/2016   Procedure: CYSTOSCOPY WITH double J STENT PLACEMENT;  Surgeon: Franchot Gallo, MD;  Location: [facility];  Service: Urology;  Laterality:  Left;  . CYSTOSCOPY WITH STENT PLACEMENT Right 10/12/2016   Procedure: CYSTOSCOPY WITH STENT PLACEMENT;  Surgeon: Hollice Espy, MD;  Location: [facility];  Service: Urology;  Laterality: Right;  . DIALYSIS/PERMA CATHETER INSERTION N/A 11/20/2016   Procedure: Dialysis/Perma Catheter Insertion;  Surgeon: Algernon Huxley, MD;  Location: [place] [facility];  Service: Cardiovascular;  Laterality: N/A;  . DIALYSIS/PERMA CATHETER REMOVAL N/A 03/30/2017   Procedure: DIALYSIS/PERMA CATHETER REMOVAL;  Surgeon: Katha Cabal, MD;  Location: [place] [facility];  Service: Cardiovascular;  Laterality: N/A;  .  ESOPHAGOGASTRODUODENOSCOPY (EGD) WITH PROPOFOL N/A 09/28/2019   Procedure: ESOPHAGOGASTRODUODENOSCOPY (EGD) WITH PROPOFOL;  Surgeon: Toledo, Benay Pike, MD;  Location: [facility];  Service: Gastroenterology;  Laterality: N/A;  . KIDNEY SURGERY  01/21/2016   IR NEPHROSTOMY PLACEMENT LEFT   . PEG PLACEMENT N/A 09/20/2019   Procedure: PERCUTANEOUS ENDOSCOPIC GASTROSTOMY (PEG) PLACEMENT;  Surgeon: Toledo, Benay Pike, MD;  Location: [facility];  Service: Gastroenterology;  Laterality: N/A;  . PERIPHERAL VASCULAR CATHETERIZATION N/A 04/07/2016   Procedure: Glori Luis Cath Insertion;  Surgeon: Katha Cabal, MD;  Location: [place] [facility];  Service: Cardiovascular;  Laterality: N/A;  . PERIPHERAL VASCULAR THROMBECTOMY Left 06/02/2017   Procedure: PERIPHERAL VASCULAR THROMBECTOMY;  Surgeon: Algernon Huxley, MD;  Location: [place] [facility];  Service: Cardiovascular;  Laterality: Left;  . PERIPHERAL VASCULAR THROMBECTOMY Left 01/28/2018   Procedure: PERIPHERAL VASCULAR THROMBECTOMY;  Surgeon: Katha Cabal, MD;  Location: [place] [facility];  Service: Cardiovascular;  Laterality: Left;  . PERIPHERAL VASCULAR THROMBECTOMY Left 03/14/2018   Procedure: PERIPHERAL VASCULAR THROMBECTOMY;  Surgeon: Algernon Huxley, MD;  Location: [place] [facility];  Service: Cardiovascular;  Laterality: Left;  . PERIPHERAL VASCULAR THROMBECTOMY Left 03/16/2018   Procedure: PERIPHERAL VASCULAR THROMBECTOMY;  Surgeon: Katha Cabal, MD;  Location: [place] [facility];  Service: Cardiovascular;  Laterality: Left;  . PORTA CATH REMOVAL N/A 02/07/2019   Procedure: PORTA CATH REMOVAL;  Surgeon: Katha Cabal, MD;  Location: [place] [facility];  Service: Cardiovascular;  Laterality: N/A;  . PORTACATH PLACEMENT Right   . ROTATOR CUFF REPAIR Left   . TEMPORARY DIALYSIS CATHETER N/A 05/11/2018   Procedure: TEMPORARY DIALYSIS CATHETER;  Surgeon: Katha Cabal, MD;  Location: [place] [facility];  Service:  Cardiovascular;  Laterality: N/A;  . TEMPORARY DIALYSIS CATHETER Left 07/27/2018   Procedure: TEMPORARY DIALYSIS CATHETER;  Surgeon: Katha Cabal, MD;  Location: [place] [facility];  Service: Cardiovascular;  Laterality: Left;  . TOTAL HIP ARTHROPLASTY Left 09/05/2019   Procedure: TOTAL HIP ARTHROPLASTY;  Surgeon: Corky Mull, MD;  Location: [facility];  Service: Orthopedics;  Laterality: Left;  . TRANSURETHRAL RESECTION OF BLADDER TUMOR N/A 02/17/2016   Procedure: TRANSURETHRAL RESECTION OF BLADDER TUMOR (TURBT)-LARGE;  Surgeon: Hollice Espy, MD;  Location: [facility];  Service: Urology;  Laterality: N/A;  . TRANSURETHRAL RESECTION OF BLADDER TUMOR N/A 10/07/2017   Procedure: TRANSURETHRAL RESECTION OF BLADDER TUMOR (TURBT)-small;  Surgeon: Hollice Espy, MD;  Location: [facility];  Service: Urology;  Laterality: N/A;  . URETEROSCOPY Left 02/17/2016   Procedure: URETEROSCOPY;  Surgeon: Hollice Espy, MD;  Location: [facility];  Service: Urology;  Laterality: Left;  . URETEROSCOPY Right 10/12/2016   Procedure: URETEROSCOPY;  Surgeon: Hollice Espy, MD;  Location: [facility];  Service: Urology;  Laterality: Right;    Allergies Glipizide er and Percocet [oxycodone-acetaminophen]  Social History Social History   Tobacco Use  . Smoking status: Former Smoker  Types: Cigarettes    Quit date: 07/20/2002    Years since quitting: 17.2  . Smokeless tobacco: Never Used  . Tobacco comment: 01/22/2016   "  quit smoking many years ago "  Substance Use Topics  . Alcohol use: No  . Drug use: No    Review of Systems Constitutional: Negative for fever. Cardiovascular: Negative for chest pain. Respiratory: Negative for shortness of breath. Gastrointestinal: Negative for abdominal pain, vomiting and diarrhea. Musculoskeletal: Negative for back pain. Skin: Negative for rash. Neurological: Negative for headaches, focal weakness or numbness.   All systems negative/normal/unremarkable except as  stated in the HPI  ____________________________________________   PHYSICAL EXAM:  VITAL SIGNS: ED Triage Vitals [10/11/19 0815]  Enc Vitals Group     BP      Pulse      Resp      Temp      Temp src      SpO2      Weight 167 lb 8.8 oz (76 kg)     Height 5' 4"  (1.626 m)     Head Circumference      Peak Flow      Pain Score      Pain Loc      Pain Edu?      Excl. in [place]?     Constitutional: Alert and oriented. Well appearing and in no distress. Eyes: Conjunctivae are normal. Normal extraocular movements. Cardiovascular: Normal rate, regular rhythm. No murmurs, rubs, or gallops. Respiratory: Normal respiratory effort without tachypnea nor retractions. Breath sounds are clear and equal bilaterally. No wheezes/rales/rhonchi. Gastrointestinal: Soft and nontender. Normal bowel sounds Musculoskeletal: Nontender with normal range of motion in extremities. No lower extremity tenderness nor edema. Neurologic:  Normal speech and language. No gross focal neurologic deficits are appreciated.  Skin:  Skin is warm, dry and intact. No rash noted. Psychiatric: Mood and affect are normal. Speech and behavior are normal.  ____________________________________________  EKG: Interpreted by me.  Sinus rhythm with rate of 75 bpm, prolonged PR interval, normal axis, normal QT  ____________________________________________  ED COURSE:  As part of my medical decision making, I reviewed the following data within the [place] History obtained from family if available, nursing notes, old chart and ekg, as well as notes from prior ED visits. Patient presented for altered mental status, we will assess with labs and imaging as indicated at this time.   Procedures  Barbara [name] was evaluated in Emergency Department on 10/11/2019 for the symptoms described in the history of present illness. She was evaluated in the context of the global COVID-19 pandemic, which necessitated consideration  that the patient might be at risk for infection with the SARS-CoV-2 virus that causes COVID-19. Institutional protocols and algorithms that pertain to the evaluation of patients at risk for COVID-19 are in a state of rapid change based on information released by regulatory bodies including the CDC and federal and state organizations. These policies and algorithms were followed during the patient's care in the ED.  ____________________________________________   LABS (pertinent positives/negatives)  Labs Reviewed  CBC WITH DIFFERENTIAL/PLATELET - Abnormal; Notable for the following components:      Result Value   WBC 14.4 (*)    RBC 3.08 (*)    Hemoglobin 9.1 (*)    HCT 28.3 (*)    RDW 17.3 (*)    Neutro Abs 12.2 (*)    Abs Immature Granulocytes 0.17 (*)    All other components within normal  limits  COMPREHENSIVE METABOLIC PANEL - Abnormal; Notable for the following components:   Potassium 3.3 (*)    Glucose, Bld 136 (*)    BUN 112 (*)    Creatinine, Ser 8.00 (*)    Calcium 8.8 (*)    Albumin 3.0 (*)    GFR calc non Af Amer 4 (*)    GFR calc Af Amer 5 (*)    All other components within normal limits  TROPONIN I (HIGH SENSITIVITY) - Abnormal; Notable for the following components:   Troponin I (High Sensitivity) 58 (*)    All other components within normal limits  TROPONIN I (HIGH SENSITIVITY)   ____________________________________________   DIFFERENTIAL DIAGNOSIS   End-stage renal disease on dialysis, dehydration, electrolyte abnormality, chronic pain, occult infection  FINAL ASSESSMENT AND PLAN  End-stage renal disease on dialysis   Plan: The patient had presented for altered mental status which has resolved. Patient's labs did not reveal any acute process.  She has been calm and cooperative here.  She was given tramadol for right hip pain.  I have discussed with nephrology and we will place her on low-dose Haldol to take every day.  She has been getting agitated at dialysis.   She is cleared for outpatient follow-up.  Laurence Aly, MD    Note: This note was generated in part or whole with voice recognition software. Voice recognition is usually quite accurate but there are transcription errors that can and very often do occur. I apologize for any typographical errors that were not detected and corrected.     Earleen Newport, MD 10/11/19 1227

## 2019-10-11 NOTE — ED Notes (Signed)
[facility]  CALLED  FOR  TRANSPORT  [facility]  [facility]

## 2019-10-11 NOTE — ED Notes (Signed)
Pt refusing to let lab draw repeat troponin. Pt refusing blood work.

## 2019-10-11 NOTE — ED Notes (Signed)
IV team to bedside. 

## 2019-10-13 ENCOUNTER — Inpatient Hospital Stay
Admission: EM | Admit: 2019-10-13 | Discharge: 2019-10-17 | DRG: 537 | Disposition: A | Discharge status: Skilled Nursing Facility | Payer: Medicare Other | Attending: Internal Medicine | Admitting: Internal Medicine

## 2019-10-13 ENCOUNTER — Encounter: Payer: Self-pay | Admitting: Emergency Medicine

## 2019-10-13 ENCOUNTER — Other Ambulatory Visit: Payer: Self-pay

## 2019-10-13 ENCOUNTER — Emergency Department: Payer: Medicare Other

## 2019-10-13 DIAGNOSIS — Z931 Gastrostomy status: Secondary | ICD-10-CM

## 2019-10-13 DIAGNOSIS — I5032 Chronic diastolic (congestive) heart failure: Secondary | ICD-10-CM | POA: Diagnosis present

## 2019-10-13 DIAGNOSIS — S73005A Unspecified dislocation of left hip, initial encounter: Secondary | ICD-10-CM | POA: Diagnosis present

## 2019-10-13 DIAGNOSIS — Z87891 Personal history of nicotine dependence: Secondary | ICD-10-CM

## 2019-10-13 DIAGNOSIS — Z79899 Other long term (current) drug therapy: Secondary | ICD-10-CM

## 2019-10-13 DIAGNOSIS — Z8551 Personal history of malignant neoplasm of bladder: Secondary | ICD-10-CM

## 2019-10-13 DIAGNOSIS — Z809 Family history of malignant neoplasm, unspecified: Secondary | ICD-10-CM

## 2019-10-13 DIAGNOSIS — Z923 Personal history of irradiation: Secondary | ICD-10-CM

## 2019-10-13 DIAGNOSIS — S73035A Other anterior dislocation of left hip, initial encounter: Secondary | ICD-10-CM | POA: Diagnosis present

## 2019-10-13 DIAGNOSIS — I10 Essential (primary) hypertension: Secondary | ICD-10-CM | POA: Diagnosis present

## 2019-10-13 DIAGNOSIS — Z794 Long term (current) use of insulin: Secondary | ICD-10-CM | POA: Diagnosis not present

## 2019-10-13 DIAGNOSIS — Z888 Allergy status to other drugs, medicaments and biological substances status: Secondary | ICD-10-CM

## 2019-10-13 DIAGNOSIS — Z8711 Personal history of peptic ulcer disease: Secondary | ICD-10-CM

## 2019-10-13 DIAGNOSIS — Z992 Dependence on renal dialysis: Secondary | ICD-10-CM

## 2019-10-13 DIAGNOSIS — Y92129 Unspecified place in nursing home as the place of occurrence of the external cause: Secondary | ICD-10-CM

## 2019-10-13 DIAGNOSIS — N2581 Secondary hyperparathyroidism of renal origin: Secondary | ICD-10-CM | POA: Diagnosis present

## 2019-10-13 DIAGNOSIS — N184 Chronic kidney disease, stage 4 (severe): Secondary | ICD-10-CM | POA: Diagnosis not present

## 2019-10-13 DIAGNOSIS — Z419 Encounter for procedure for purposes other than remedying health state, unspecified: Secondary | ICD-10-CM

## 2019-10-13 DIAGNOSIS — T84021A Dislocation of internal left hip prosthesis, initial encounter: Secondary | ICD-10-CM | POA: Diagnosis present

## 2019-10-13 DIAGNOSIS — T148XXA Other injury of unspecified body region, initial encounter: Secondary | ICD-10-CM

## 2019-10-13 DIAGNOSIS — K219 Gastro-esophageal reflux disease without esophagitis: Secondary | ICD-10-CM | POA: Diagnosis present

## 2019-10-13 DIAGNOSIS — Z6828 Body mass index (BMI) 28.0-28.9, adult: Secondary | ICD-10-CM | POA: Diagnosis not present

## 2019-10-13 DIAGNOSIS — R531 Weakness: Secondary | ICD-10-CM | POA: Diagnosis not present

## 2019-10-13 DIAGNOSIS — L89152 Pressure ulcer of sacral region, stage 2: Secondary | ICD-10-CM | POA: Diagnosis present

## 2019-10-13 DIAGNOSIS — Z20822 Contact with and (suspected) exposure to covid-19: Secondary | ICD-10-CM | POA: Diagnosis present

## 2019-10-13 DIAGNOSIS — E43 Unspecified severe protein-calorie malnutrition: Secondary | ICD-10-CM | POA: Diagnosis present

## 2019-10-13 DIAGNOSIS — I132 Hypertensive heart and chronic kidney disease with heart failure and with stage 5 chronic kidney disease, or end stage renal disease: Secondary | ICD-10-CM | POA: Diagnosis present

## 2019-10-13 DIAGNOSIS — Z885 Allergy status to narcotic agent status: Secondary | ICD-10-CM

## 2019-10-13 DIAGNOSIS — W1830XA Fall on same level, unspecified, initial encounter: Secondary | ICD-10-CM | POA: Diagnosis present

## 2019-10-13 DIAGNOSIS — J9611 Chronic respiratory failure with hypoxia: Secondary | ICD-10-CM | POA: Diagnosis present

## 2019-10-13 DIAGNOSIS — E1122 Type 2 diabetes mellitus with diabetic chronic kidney disease: Secondary | ICD-10-CM

## 2019-10-13 DIAGNOSIS — E1140 Type 2 diabetes mellitus with diabetic neuropathy, unspecified: Secondary | ICD-10-CM | POA: Diagnosis present

## 2019-10-13 DIAGNOSIS — Z9889 Other specified postprocedural states: Secondary | ICD-10-CM

## 2019-10-13 DIAGNOSIS — M25552 Pain in left hip: Secondary | ICD-10-CM | POA: Diagnosis present

## 2019-10-13 DIAGNOSIS — Z66 Do not resuscitate: Secondary | ICD-10-CM | POA: Diagnosis present

## 2019-10-13 DIAGNOSIS — Z90710 Acquired absence of both cervix and uterus: Secondary | ICD-10-CM

## 2019-10-13 DIAGNOSIS — E860 Dehydration: Secondary | ICD-10-CM | POA: Diagnosis present

## 2019-10-13 DIAGNOSIS — Y792 Prosthetic and other implants, materials and accessory orthopedic devices associated with adverse incidents: Secondary | ICD-10-CM | POA: Diagnosis present

## 2019-10-13 DIAGNOSIS — I2510 Atherosclerotic heart disease of native coronary artery without angina pectoris: Secondary | ICD-10-CM | POA: Diagnosis present

## 2019-10-13 DIAGNOSIS — G40909 Epilepsy, unspecified, not intractable, without status epilepticus: Secondary | ICD-10-CM | POA: Diagnosis present

## 2019-10-13 DIAGNOSIS — W19XXXA Unspecified fall, initial encounter: Secondary | ICD-10-CM

## 2019-10-13 DIAGNOSIS — L8990 Pressure ulcer of unspecified site, unspecified stage: Secondary | ICD-10-CM | POA: Diagnosis present

## 2019-10-13 DIAGNOSIS — D631 Anemia in chronic kidney disease: Secondary | ICD-10-CM | POA: Diagnosis present

## 2019-10-13 DIAGNOSIS — N186 End stage renal disease: Secondary | ICD-10-CM | POA: Diagnosis present

## 2019-10-13 DIAGNOSIS — E785 Hyperlipidemia, unspecified: Secondary | ICD-10-CM | POA: Diagnosis present

## 2019-10-13 DIAGNOSIS — G4730 Sleep apnea, unspecified: Secondary | ICD-10-CM | POA: Diagnosis present

## 2019-10-13 DIAGNOSIS — Z833 Family history of diabetes mellitus: Secondary | ICD-10-CM

## 2019-10-13 DIAGNOSIS — R1310 Dysphagia, unspecified: Secondary | ICD-10-CM | POA: Diagnosis present

## 2019-10-13 DIAGNOSIS — J449 Chronic obstructive pulmonary disease, unspecified: Secondary | ICD-10-CM | POA: Diagnosis present

## 2019-10-13 DIAGNOSIS — Z8679 Personal history of other diseases of the circulatory system: Secondary | ICD-10-CM

## 2019-10-13 DIAGNOSIS — Z9981 Dependence on supplemental oxygen: Secondary | ICD-10-CM

## 2019-10-13 DIAGNOSIS — S73035D Other anterior dislocation of left hip, subsequent encounter: Secondary | ICD-10-CM | POA: Diagnosis not present

## 2019-10-13 DIAGNOSIS — M899 Disorder of bone, unspecified: Secondary | ICD-10-CM | POA: Diagnosis present

## 2019-10-13 DIAGNOSIS — Z9221 Personal history of antineoplastic chemotherapy: Secondary | ICD-10-CM

## 2019-10-13 LAB — COMPREHENSIVE METABOLIC PANEL
ALT: 10 U/L (ref 0–44)
AST: 21 U/L (ref 15–41)
Albumin: 3.1 g/dL — ABNORMAL LOW (ref 3.5–5.0)
Alkaline Phosphatase: 86 U/L (ref 38–126)
Anion gap: 20 — ABNORMAL HIGH (ref 5–15)
BUN: 145 mg/dL — ABNORMAL HIGH (ref 8–23)
CO2: 25 mmol/L (ref 22–32)
Calcium: 9.4 mg/dL (ref 8.9–10.3)
Chloride: 94 mmol/L — ABNORMAL LOW (ref 98–111)
Creatinine, Ser: 12.22 mg/dL — ABNORMAL HIGH (ref 0.44–1.00)
GFR calc Af Amer: 3 mL/min — ABNORMAL LOW (ref >60)
GFR calc non Af Amer: 3 mL/min — ABNORMAL LOW (ref >60)
Glucose, Bld: 116 mg/dL — ABNORMAL HIGH (ref 70–99)
Potassium: 4.9 mmol/L (ref 3.5–5.1)
Sodium: 139 mmol/L (ref 135–145)
Total Bilirubin: 0.6 mg/dL (ref 0.3–1.2)
Total Protein: 7.6 g/dL (ref 6.5–8.1)

## 2019-10-13 LAB — CBC
HCT: 28.5 % — ABNORMAL LOW (ref 36.0–46.0)
Hemoglobin: 9 g/dL — ABNORMAL LOW (ref 12.0–15.0)
MCH: 29.4 pg (ref 26.0–34.0)
MCHC: 31.6 g/dL (ref 30.0–36.0)
MCV: 93.1 fL (ref 80.0–100.0)
Platelets: 241 10*3/uL (ref 150–400)
RBC: 3.06 MIL/uL — ABNORMAL LOW (ref 3.87–5.11)
RDW: 17.6 % — ABNORMAL HIGH (ref 11.5–15.5)
WBC: 11 10*3/uL — ABNORMAL HIGH (ref 4.0–10.5)
nRBC: 0 % (ref 0.0–0.2)

## 2019-10-13 LAB — RESPIRATORY PANEL BY RT PCR (FLU A&B, COVID)
Influenza A by PCR: NEGATIVE
Influenza B by PCR: NEGATIVE
SARS Coronavirus 2 by RT PCR: NEGATIVE

## 2019-10-13 LAB — PROTIME-INR
INR: 1 (ref 0.8–1.2)
Prothrombin Time: 12.9 seconds (ref 11.4–15.2)

## 2019-10-13 MED ORDER — ONDANSETRON HCL 4 MG PO TABS: 4.0000 mg | ORAL_TABLET | Freq: Four times a day (QID) | ORAL | Status: DC | PRN

## 2019-10-13 MED ORDER — SODIUM CHLORIDE 0.9 % IV BOLUS: 500.0000 mL | Freq: Once | INTRAVENOUS | Status: DC

## 2019-10-13 MED ORDER — ONDANSETRON HCL 4 MG/2ML IJ SOLN: 4.0000 mg | Freq: Four times a day (QID) | INTRAMUSCULAR | Status: DC | PRN

## 2019-10-13 MED ORDER — INSULIN ASPART 100 UNIT/ML ~~LOC~~ SOLN: 0.0000 [IU] | Freq: Every day | SUBCUTANEOUS | Status: DC

## 2019-10-13 MED ORDER — INSULIN ASPART 100 UNIT/ML ~~LOC~~ SOLN: 0.0000 [IU] | Freq: Three times a day (TID) | SUBCUTANEOUS | Status: DC

## 2019-10-13 MED ORDER — HEPARIN SODIUM (PORCINE) 5000 UNIT/ML IJ SOLN
5000.0000 [IU] | Freq: Three times a day (TID) | INTRAMUSCULAR | Status: DC
  Administered 2019-10-14 – 2019-10-17 (×10): 5000 [IU] via SUBCUTANEOUS
  Filled 2019-10-13 (×12): qty 1

## 2019-10-13 MED ORDER — TRAMADOL HCL 50 MG PO TABS
50.0000 mg | ORAL_TABLET | Freq: Two times a day (BID) | ORAL | Status: DC | PRN
  Administered 2019-10-14: 50 mg via ORAL
  Filled 2019-10-13: qty 1

## 2019-10-13 NOTE — Progress Notes (Signed)
Full consult note and discussion with patient to follow tomorrow prior to OR.  Called by ED staff. Imaging reviewed. Patient has a dislocated periprosthetic L hip s/p L THA by Dr. Roland Rack on 09/05/19. Patient deemed too high risk for sedation in ED given multiple medical comorbidities.  - Plan for surgery tomorrow pending OR availability for closed reduction of L hip. - NPO after midnight - Hold anticoagulation - Admit to Hospitalist team given multiple medical comorbidities.

## 2019-10-13 NOTE — ED Notes (Signed)
IV team at bedside 

## 2019-10-13 NOTE — ED Notes (Signed)
Newly placed PEG tube assessed, abdominal binder in place. Dirty washcloth is only protection. This RN placed split gauze and woven gauze to cover area, reapplied abd binder

## 2019-10-13 NOTE — Progress Notes (Signed)
Consult received for IV start but currently there are no orders for saline lock, IV fluids or IV meds therefore unable to perform this procedure.  Requested Sherral Hammers, RN to re enter IV consult once orders for the above are in place.

## 2019-10-13 NOTE — ED Notes (Signed)
2 unsuccessful IV start/blood draw attempts by this RN. Pt is a dialysis pt and access is limited to the right arm ONLY. Kandis Nab, primary RN, made aware and order being placed for IV team consult at this time.

## 2019-10-13 NOTE — ED Provider Notes (Signed)
[facility] Emergency Department Provider Note  Time seen: 7:20 PM  I have reviewed the triage vital signs and the nursing notes.   HISTORY  Chief Complaint Fall and Hip Pain   HPI Barbara [name] is a 79 y.o. female with a past medical history of diabetes, hemodialysis, anemia, COPD, hypertension, presents to the emergency department for possible dislocated left hip.  According to EMS report patient is coming from her nursing facility where she had a fall last night.  Patient had x-rays done today due to pain showing possible dislocation of her prosthetic left hip.   Per record review appeared to have a left hip arthroplasty done by Dr. Roland Rack on 09/05/2019.  Here patient is awake alert, no distress, denies any pain however is tender to left hip palpation or attempted range of motion.  Past Medical History:  Diagnosis Date  . Adult celiac disease   . Anemia associated with chronic renal failure 2017   blood transfusion last week 10/17  . Arthritis   . Asthma   . 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 Chronic kidney disease   . 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  dr. Candiss Norse and dr. Holley Raring follow her  . COPD (chronic obstructive pulmonary disease) ([place])   . Coronary artery disease   . Diabetes mellitus without complication ([place])   . Dialysis patient ([place])    Tues, Thurs, Sat  . Dyspnea    with exertion  . Elevated lipids   . GERD (gastroesophageal reflux disease)   . Hematuria   . Hypertension   . Lower back pain   . Neuropathy   . Oxygen dependent    at hs  . Personal history of chemotherapy 2017   bladder ca  . Personal history of radiation therapy 2017   bladder ca  . Sleep apnea    uses cpap  . Urinary obstruction 01/2016    Patient Active Problem List   Diagnosis Date Noted  . Acute GI bleeding 09/28/2019  . Acute respiratory failure with hypoxia ([place]) 09/28/2019  . Chronic diastolic CHF (congestive heart failure) ([place])  09/28/2019  . Seizure ([place]) 09/28/2019  . PEG tube malfunction ([place]) 09/28/2019  . Sacral ulcer ([place]) 09/23/2019  . Dysphagia 09/19/2019  . Acute encephalopathy   . Goals of care, counseling/discussion   . Aspiration pneumonia ([place]) 09/11/2019  . Status post total hip replacement, left 09/05/2019  . Type II diabetes mellitus with renal manifestations ([place]) 09/05/2019  . ESRD on dialysis ([place]) 09/05/2019  . Bacteremia due to other bacteria   . Bacteroides fragilis infection   . Acute metabolic encephalopathy   . Somnolence   . Lobar pneumonia ([place]) 05/11/2019  . Dialysis patient ([place])   . Primary osteoarthritis of left hip 04/07/2019  . Metabolic acidosis 57/32/2025  . Complication from renal dialysis device 01/28/2018  . History of colonic polyps 07/30/2017  . Presence of cardiac and vascular implant and graft   . Fever   . Sepsis ([place])   . ESRD (end stage renal disease) ([place]) 02/01/2017  . Sepsis secondary to UTI ([place]) 01/24/2017  . Acute kidney injury ([place]) 11/19/2016  . Anemia, chronic renal failure, stage 4 (severe) ([place]) 09/17/2016  . Hypoglycemia 06/04/2016  . Hyperkalemia 05/12/2016  . Protein-calorie malnutrition, severe 05/05/2016  . Acute on chronic renal failure ([place]) 05/04/2016  . Convulsions ([place]) 04/17/2016  . Palliative care by specialist   . DNR (do not resuscitate) discussion   .  C. difficile diarrhea 03/11/2016  . Anemia 03/11/2016  . Hypotension 03/11/2016  . Acidosis 03/11/2016  . Failure to thrive (child) 03/11/2016  . Weakness generalized 03/11/2016  . ARF (acute renal failure) ([place]) 03/04/2016  . 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 Absolute anemia 02/04/2016  . Airway hyperreactivity 02/04/2016  . Celiac disease 02/04/2016  . Gastric catarrh 02/04/2016  . Acid reflux 02/04/2016  . Combined fat and carbohydrate induced hyperlipemia 02/04/2016  . C. difficile colitis 01/22/2016  . Urinary obstruction 01/21/2016  . COPD  (chronic obstructive pulmonary disease) ([place]) 01/21/2016  . Controlled type 2 diabetes mellitus with stage 4 chronic kidney disease, with long-term current use of insulin ([place]) 01/21/2016  . Essential hypertension 01/21/2016  . Acute renal failure superimposed on stage 4 chronic kidney disease ([place]) 01/20/2016  . Leukocytosis 01/20/2016  . Anemia in chronic kidney disease 01/20/2016  . Arthritis of knee, degenerative 05/10/2015  . Knee strain 03/26/2015  . Other intervertebral disc displacement, lumbar region 03/04/2015  . Degenerative arthritis of lumbar spine 03/04/2015  . HNP (herniated nucleus pulposus), lumbar 03/04/2015  . Osteoarthritis of spine with radiculopathy, lumbar region 03/04/2015  . Injury of tendon of upper extremity 11/12/2014  . Atherosclerosis of abdominal aorta ([place]) 11/02/2014  . Chronic kidney disease, stage IV (severe) ([place]) 11/02/2014  . Obstructive apnea 11/02/2014  . Complete rotator cuff rupture of left shoulder 10/05/2014  . Arthritis of shoulder region, degenerative 10/05/2014  . CAD in native artery 12/08/2013  . Benign essential HTN 12/08/2013  . Type 2 diabetes mellitus ([place]) 12/08/2013    Past Surgical History:  Procedure Laterality Date  . A/V SHUNT INTERVENTION N/A 05/12/2018   Procedure: A/V SHUNT INTERVENTION;  Surgeon: Algernon Huxley, MD;  Location: [facility];  Service: Cardiovascular;  Laterality: N/A;  . A/V SHUNT INTERVENTION Left 07/29/2018   Procedure: A/V SHUNT INTERVENTION;  Surgeon: Katha Cabal, MD;  Location: [facility];  Service: Cardiovascular;  Laterality: Left;  . ABDOMINAL HYSTERECTOMY    . AV FISTULA PLACEMENT Left 02/17/2017   Procedure: INSERTION OF ARTERIOVENOUS (AV) GORE-TEX GRAFT ARM(BRACHIAL AXILLARY);  Surgeon: Katha Cabal, MD;  Location: [facility];  Service: Vascular;  Laterality: Left;  . CYSTOSCOPY W/ RETROGRADES Bilateral 02/17/2016   Procedure: CYSTOSCOPY WITH RETROGRADE PYELOGRAM;   Surgeon: Hollice Espy, MD;  Location: [facility];  Service: Urology;  Laterality: Bilateral;  . CYSTOSCOPY W/ RETROGRADES Bilateral 10/12/2016   Procedure: CYSTOSCOPY WITH RETROGRADE PYELOGRAM;  Surgeon: Hollice Espy, MD;  Location: [facility];  Service: Urology;  Laterality: Bilateral;  . CYSTOSCOPY W/ RETROGRADES Bilateral 06/09/2017   Procedure: CYSTOSCOPY WITH RETROGRADE PYELOGRAM;  Surgeon: Hollice Espy, MD;  Location: [facility];  Service: Urology;  Laterality: Bilateral;  . CYSTOSCOPY W/ RETROGRADES Bilateral 10/07/2017   Procedure: CYSTOSCOPY WITH RETROGRADE PYELOGRAM;  Surgeon: Hollice Espy, MD;  Location: [facility];  Service: Urology;  Laterality: Bilateral;  . CYSTOSCOPY W/ URETERAL STENT PLACEMENT Left 05/12/2016   Procedure: CYSTOSCOPY WITH STENT REPLACEMENT;  Surgeon: Hollice Espy, MD;  Location: [facility];  Service: Urology;  Laterality: Left;  . CYSTOSCOPY W/ URETERAL STENT PLACEMENT Left 10/12/2016   Procedure: CYSTOSCOPY WITH STENT REPLACEMENT;  Surgeon: Hollice Espy, MD;  Location: [facility];  Service: Urology;  Laterality: Left;  . CYSTOSCOPY WITH BIOPSY N/A 06/09/2017   Procedure: CYSTOSCOPY WITH BLADDER BIOPSY;  Surgeon: Hollice Espy, MD;  Location: [facility];  Service: Urology;  Laterality: N/A;  . CYSTOSCOPY WITH STENT PLACEMENT Left 01/21/2016  Procedure: CYSTOSCOPY WITH double J STENT PLACEMENT;  Surgeon: Franchot Gallo, MD;  Location: [facility];  Service: Urology;  Laterality: Left;  . CYSTOSCOPY WITH STENT PLACEMENT Right 10/12/2016   Procedure: CYSTOSCOPY WITH STENT PLACEMENT;  Surgeon: Hollice Espy, MD;  Location: [facility];  Service: Urology;  Laterality: Right;  . DIALYSIS/PERMA CATHETER INSERTION N/A 11/20/2016   Procedure: Dialysis/Perma Catheter Insertion;  Surgeon: Algernon Huxley, MD;  Location: [facility];  Service: Cardiovascular;  Laterality: N/A;  . DIALYSIS/PERMA CATHETER REMOVAL N/A 03/30/2017   Procedure: DIALYSIS/PERMA CATHETER REMOVAL;   Surgeon: Katha Cabal, MD;  Location: [facility];  Service: Cardiovascular;  Laterality: N/A;  . ESOPHAGOGASTRODUODENOSCOPY (EGD) WITH PROPOFOL N/A 09/28/2019   Procedure: ESOPHAGOGASTRODUODENOSCOPY (EGD) WITH PROPOFOL;  Surgeon: Toledo, Benay Pike, MD;  Location: [facility];  Service: Gastroenterology;  Laterality: N/A;  . KIDNEY SURGERY  01/21/2016   IR NEPHROSTOMY PLACEMENT LEFT   . PEG PLACEMENT N/A 09/20/2019   Procedure: PERCUTANEOUS ENDOSCOPIC GASTROSTOMY (PEG) PLACEMENT;  Surgeon: Toledo, Benay Pike, MD;  Location: [facility];  Service: Gastroenterology;  Laterality: N/A;  . PERIPHERAL VASCULAR CATHETERIZATION N/A 04/07/2016   Procedure: Glori Luis Cath Insertion;  Surgeon: Katha Cabal, MD;  Location: [facility];  Service: Cardiovascular;  Laterality: N/A;  . PERIPHERAL VASCULAR THROMBECTOMY Left 06/02/2017   Procedure: PERIPHERAL VASCULAR THROMBECTOMY;  Surgeon: Algernon Huxley, MD;  Location: [facility];  Service: Cardiovascular;  Laterality: Left;  . PERIPHERAL VASCULAR THROMBECTOMY Left 01/28/2018   Procedure: PERIPHERAL VASCULAR THROMBECTOMY;  Surgeon: Katha Cabal, MD;  Location: [facility];  Service: Cardiovascular;  Laterality: Left;  . PERIPHERAL VASCULAR THROMBECTOMY Left 03/14/2018   Procedure: PERIPHERAL VASCULAR THROMBECTOMY;  Surgeon: Algernon Huxley, MD;  Location: [facility];  Service: Cardiovascular;  Laterality: Left;  . PERIPHERAL VASCULAR THROMBECTOMY Left 03/16/2018   Procedure: PERIPHERAL VASCULAR THROMBECTOMY;  Surgeon: Katha Cabal, MD;  Location: [facility];  Service: Cardiovascular;  Laterality: Left;  . PORTA CATH REMOVAL N/A 02/07/2019   Procedure: PORTA CATH REMOVAL;  Surgeon: Katha Cabal, MD;  Location: [facility];  Service: Cardiovascular;  Laterality: N/A;  . PORTACATH PLACEMENT Right   . ROTATOR CUFF REPAIR Left   . TEMPORARY DIALYSIS CATHETER N/A 05/11/2018    Procedure: TEMPORARY DIALYSIS CATHETER;  Surgeon: Katha Cabal, MD;  Location: [facility];  Service: Cardiovascular;  Laterality: N/A;  . TEMPORARY DIALYSIS CATHETER Left 07/27/2018   Procedure: TEMPORARY DIALYSIS CATHETER;  Surgeon: Katha Cabal, MD;  Location: [facility];  Service: Cardiovascular;  Laterality: Left;  . TOTAL HIP ARTHROPLASTY Left 09/05/2019   Procedure: TOTAL HIP ARTHROPLASTY;  Surgeon: Corky Mull, MD;  Location: [facility];  Service: Orthopedics;  Laterality: Left;  . TRANSURETHRAL RESECTION OF BLADDER TUMOR N/A 02/17/2016   Procedure: TRANSURETHRAL RESECTION OF BLADDER TUMOR (TURBT)-LARGE;  Surgeon: Hollice Espy, MD;  Location: [facility];  Service: Urology;  Laterality: N/A;  . TRANSURETHRAL RESECTION OF BLADDER TUMOR N/A 10/07/2017   Procedure: TRANSURETHRAL RESECTION OF BLADDER TUMOR (TURBT)-small;  Surgeon: Hollice Espy, MD;  Location: [facility];  Service: Urology;  Laterality: N/A;  . URETEROSCOPY Left 02/17/2016   Procedure: URETEROSCOPY;  Surgeon: Hollice Espy, MD;  Location: [facility];  Service: Urology;  Laterality: Left;  . URETEROSCOPY Right 10/12/2016   Procedure: URETEROSCOPY;  Surgeon: Hollice Espy, MD;  Location: [facility];  Service: Urology;  Laterality: Right;    Prior to Admission  medications   Medication Sig Start Date End Date Taking? Authorizing Provider  albuterol (PROVENTIL HFA;VENTOLIN HFA) 108 (90 Base) MCG/ACT inhaler Inhale 2 puffs into the lungs every 6 (six) hours as needed for wheezing or shortness of breath.  05/16/18   [provider]  Amino Acids-Protein Hydrolys (FEEDING SUPPLEMENT, PRO-STAT SUGAR FREE 64,) LIQD Place 30 mLs into feeding tube 2 (two) times daily. 09/24/19   Nolberto Hanlon, MD  amLODipine (NORVASC) 5 MG tablet Place 1 tablet (5 mg total) into feeding tube daily. 09/24/19   Nolberto Hanlon, MD  atorvastatin (LIPITOR) 40 MG tablet Place 40 mg into feeding tube at bedtime.  05/01/19   [provider]  B Complex-C (B-COMPLEX WITH VITAMIN C) tablet Place 1 tablet into feeding tube at bedtime. 09/24/19   Nolberto Hanlon, MD  Blood Glucose Monitoring Suppl (ACCU-CHEK AVIVA PLUS) w/Device KIT (USE TO MONITOR BLOOD SUGARS.) 12/30/17   [provider]  Calcium Acetate 667 MG TABS Place 1,334 mg into feeding tube 3 (three) times daily.    [provider]  calcium carbonate (TUMS - DOSED IN MG ELEMENTAL CALCIUM) 500 MG chewable tablet Place 1 tablet into feeding tube at bedtime.     [provider]  cholecalciferol (VITAMIN D) 1000 units tablet Take 1 tablet (1,000 Units total) by mouth daily. 09/24/19   Nolberto Hanlon, MD  epoetin alfa (EPOGEN) 10000 UNIT/ML injection Inject 1 mL (10,000 Units total) into the vein every Monday, Wednesday, and Friday with hemodialysis. 09/25/19   Nolberto Hanlon, MD  esomeprazole (NEXIUM) 40 MG packet 40 mg daily before breakfast. Per tube    [provider]  ferrous sulfate 300 (60 Fe) MG/5ML syrup Place 324 mg into feeding tube at bedtime.    [provider]  gabapentin (NEURONTIN) 300 MG capsule Place 2 capsules (600 mg total) into feeding tube 3 (three) times daily. Patient taking differently: Place 300 mg into feeding tube 3 (three) times daily.  09/24/19   Nolberto Hanlon, MD  haloperidol (HALDOL) 1 MG tablet Take 1 tablet (1 mg total) by mouth in the morning. 10/11/19 11/10/19  Earleen Newport, MD  heparin 5000 UNIT/ML injection Inject 1 mL (5,000 Units total) into the skin every 12 (twelve) hours. 09/24/19   Nolberto Hanlon, MD  lacosamide 100 MG TABS Place 1 tablet (100 mg total) into feeding tube 2 (two) times daily. 09/24/19   Nolberto Hanlon, MD  multivitamin (RENA-VIT) TABS tablet Place 1 tablet into feeding tube at bedtime. 09/24/19   Nolberto Hanlon, MD  NON FORMULARY Place 1 Units into the nose at bedtime. CPAP Time of use 2100-0600    [provider]  oxybutynin (DITROPAN) 5 MG tablet Place 1 tablet (5 mg total) into  feeding tube 3 (three) times daily. 09/24/19   Nolberto Hanlon, MD  OXYGEN Inhale 2 L into the lungs at bedtime.    [provider]  Water For Irrigation, Sterile (FREE WATER) SOLN Place 50 mLs into feeding tube every 4 (four) hours. Patient not taking: Reported on 09/27/2019 09/24/19   Nolberto Hanlon, MD    Allergies  Allergen Reactions  . Glipizide Er Other (See Comments)    Hypoglycemia   . Percocet [Oxycodone-Acetaminophen] Hives    Family History  Problem Relation Age of Onset  . Diabetes Mother   . Cancer Father   . Breast cancer Neg Hx   . Kidney disease Neg Hx     Social History Social History   Tobacco Use  .  Smoking status: Former Smoker    Types: Cigarettes    Quit date: 07/20/2002    Years since quitting: 17.2  . Smokeless tobacco: Never Used  Substance Use Topics  . Alcohol use: No  . Drug use: No    Review of Systems Constitutional: Negative for fever. Cardiovascular: Negative for chest pain. Respiratory: Negative for shortness of breath. Gastrointestinal: States mild pain at recent PEG tube insertion site. Musculoskeletal: Left hip pain Neurological: Negative for headache All other ROS negative  ____________________________________________   PHYSICAL EXAM:  VITAL SIGNS: ED Triage Vitals  Enc Vitals Group     BP 10/13/19 1911 (!) 142/59     Pulse Rate 10/13/19 1911 79     Resp 10/13/19 1911 18     Temp 10/13/19 1911 98.2 F (36.8 C)     Temp Source 10/13/19 1911 Oral     SpO2 10/13/19 1911 96 %     Weight 10/13/19 1913 167 lb 8.8 oz (76 kg)     Height 10/13/19 1913 5' 4" (1.626 m)     Head Circumference --      Peak Flow --      Pain Score --      Pain Loc --      Pain Edu? --      Excl. in [place]? --     Constitutional: Alert, chronically ill-appearing.  No acute distress Eyes: Normal exam ENT      Head: Normocephalic and atraumatic.      Mouth/Throat: Mucous membranes are moist. Cardiovascular: Normal rate, regular  rhythm Respiratory: Normal respiratory effort without tachypnea nor retractions. Breath sounds are clear Gastrointestinal: Soft and nontender. No distention.   Musculoskeletal: Well-appearing fairly recent appearing surgical incision to the left hip.  Moderate tenderness palpation this area, pain with attempted range of motion.  Appears to be neurovascular intact. Neurologic:  Normal speech and language. No gross focal neurologic deficits  Skin:  Skin is warm, dry and intact.  Psychiatric: Mood and affect are normal.   ____________________________________________    RADIOLOGY  X-ray shows superiorly dislocated left hip arthroplasty. Chest x-ray negative  ____________________________________________   INITIAL IMPRESSION / ASSESSMENT AND PLAN / ED COURSE  Pertinent labs & imaging results that were available during my care of the patient were reviewed by me and considered in my medical decision making (see chart for details).   Patient presents emergency department after a fall last night with possible left hip dislocation.  We will obtain x-ray images, lab work continue to closely monitor.  Patient does appear chronically ill but no acute distress.  X-ray shows a left arthroplasty dislocation.  I spoke to Dr. Posey Pronto of orthopedics, believes the patient will require sedation for reduction.  However given the patient's significant medical comorbidities I believe the patient would be best managed by anesthesia and reduction in the OR.  Dr. Posey Pronto is agreeable to this plan of care.  We will admit to the hospitalist service for continued monitoring, medical optimization prior to closed reduction tomorrow.  Barbara [name] was evaluated in Emergency Department on 10/13/2019 for the symptoms described in the history of present illness. She was evaluated in the context of the global COVID-19 pandemic, which necessitated consideration that the patient might be at risk for infection with the SARS-CoV-2  virus that causes COVID-19. Institutional protocols and algorithms that pertain to the evaluation of patients at risk for COVID-19 are in a state of rapid change based on information released by regulatory bodies including  the [employer] and federal and state organizations. These policies and algorithms were followed during the patient's care in the ED.  ____________________________________________   FINAL CLINICAL IMPRESSION(S) / ED DIAGNOSES  Left hip dislocation   Harvest Dark, MD 10/13/19 2153

## 2019-10-13 NOTE — ED Notes (Signed)
Patient transported to x-ray. ?

## 2019-10-13 NOTE — ED Notes (Signed)
ED TO INPATIENT HANDOFF REPORT  ED Nurse Name and Phone #: Bascom Levels Name/Age/Gender Barbara [name] 79 y.o. female Room/Bed: ED26A/ED26A  Code Status   Code Status: Prior  Home/SNF/Other Skilled nursing facility Patient oriented to: self, place, time and situation Is this baseline? Yes   Triage Complete: Triage complete  Chief Complaint Anterior dislocation of left hip, initial encounter [facility]) [X44.818H]  Triage Note Patient arrives from [facility] with complaints of L hip/leg pain after a fall yesterday. Patient reports increased pain. Patient is on dialysis    Allergies Allergies  Allergen Reactions  . Glipizide Er Other (See Comments)    Hypoglycemia   . Percocet [Oxycodone-Acetaminophen] Hives    Level of Care/Admitting Diagnosis ED Disposition    ED Disposition Condition [facility] Area: [place] [[id number]]  Level of Care: Med-Surg [16]  Covid Evaluation: Asymptomatic Screening Protocol (No Symptoms)  Diagnosis: Anterior dislocation of left hip, initial encounter [facility]) [[id number]]  Admitting Physician: Elwyn Reach [[id number]]  Attending Physician: Elwyn Reach [[id number]]  Estimated length of stay: past midnight tomorrow  Certification:: I certify this patient will need inpatient services for at least 2 midnights       B Medical/Surgery History Past Medical History:  Diagnosis Date  . Adult celiac disease   . Anemia associated with chronic renal failure 2017   blood transfusion last week 10/17  . Arthritis   . Asthma   . 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 Chronic kidney disease   . 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  dr. Candiss Norse and dr. Holley Raring follow her  . COPD (chronic obstructive pulmonary disease) ([place])   . Coronary artery disease   . Diabetes mellitus without complication ([place])   . Dialysis patient ([place])    Tues, Thurs, Sat  . Dyspnea    with exertion  . Elevated lipids   . GERD  (gastroesophageal reflux disease)   . Hematuria   . Hypertension   . Lower back pain   . Neuropathy   . Oxygen dependent    at hs  . Personal history of chemotherapy 2017   bladder ca  . Personal history of radiation therapy 2017   bladder ca  . Sleep apnea    uses cpap  . Urinary obstruction 01/2016   Past Surgical History:  Procedure Laterality Date  . A/V SHUNT INTERVENTION N/A 05/12/2018   Procedure: A/V SHUNT INTERVENTION;  Surgeon: Algernon Huxley, MD;  Location: [place] [facility];  Service: Cardiovascular;  Laterality: N/A;  . A/V SHUNT INTERVENTION Left 07/29/2018   Procedure: A/V SHUNT INTERVENTION;  Surgeon: Katha Cabal, MD;  Location: [place] [facility];  Service: Cardiovascular;  Laterality: Left;  . ABDOMINAL HYSTERECTOMY    . AV FISTULA PLACEMENT Left 02/17/2017   Procedure: INSERTION OF ARTERIOVENOUS (AV) GORE-TEX GRAFT ARM(BRACHIAL AXILLARY);  Surgeon: Katha Cabal, MD;  Location: [facility];  Service: Vascular;  Laterality: Left;  . CYSTOSCOPY W/ RETROGRADES Bilateral 02/17/2016   Procedure: CYSTOSCOPY WITH RETROGRADE PYELOGRAM;  Surgeon: Hollice Espy, MD;  Location: [facility];  Service: Urology;  Laterality: Bilateral;  . CYSTOSCOPY W/ RETROGRADES Bilateral 10/12/2016   Procedure: CYSTOSCOPY WITH RETROGRADE PYELOGRAM;  Surgeon: Hollice Espy, MD;  Location: [facility];  Service: Urology;  Laterality: Bilateral;  . CYSTOSCOPY W/ RETROGRADES Bilateral 06/09/2017   Procedure: CYSTOSCOPY WITH RETROGRADE PYELOGRAM;  Surgeon: Hollice Espy, MD;  Location: [facility];  Service:  Urology;  Laterality: Bilateral;  . CYSTOSCOPY W/ RETROGRADES Bilateral 10/07/2017   Procedure: CYSTOSCOPY WITH RETROGRADE PYELOGRAM;  Surgeon: Hollice Espy, MD;  Location: [facility];  Service: Urology;  Laterality: Bilateral;  . CYSTOSCOPY W/ URETERAL STENT PLACEMENT Left 05/12/2016   Procedure: CYSTOSCOPY WITH STENT REPLACEMENT;  Surgeon: Hollice Espy, MD;  Location: [facility];  Service:  Urology;  Laterality: Left;  . CYSTOSCOPY W/ URETERAL STENT PLACEMENT Left 10/12/2016   Procedure: CYSTOSCOPY WITH STENT REPLACEMENT;  Surgeon: Hollice Espy, MD;  Location: [facility];  Service: Urology;  Laterality: Left;  . CYSTOSCOPY WITH BIOPSY N/A 06/09/2017   Procedure: CYSTOSCOPY WITH BLADDER BIOPSY;  Surgeon: Hollice Espy, MD;  Location: [facility];  Service: Urology;  Laterality: N/A;  . CYSTOSCOPY WITH STENT PLACEMENT Left 01/21/2016   Procedure: CYSTOSCOPY WITH double J STENT PLACEMENT;  Surgeon: Franchot Gallo, MD;  Location: [facility];  Service: Urology;  Laterality: Left;  . CYSTOSCOPY WITH STENT PLACEMENT Right 10/12/2016   Procedure: CYSTOSCOPY WITH STENT PLACEMENT;  Surgeon: Hollice Espy, MD;  Location: [facility];  Service: Urology;  Laterality: Right;  . DIALYSIS/PERMA CATHETER INSERTION N/A 11/20/2016   Procedure: Dialysis/Perma Catheter Insertion;  Surgeon: Algernon Huxley, MD;  Location: [place] [facility];  Service: Cardiovascular;  Laterality: N/A;  . DIALYSIS/PERMA CATHETER REMOVAL N/A 03/30/2017   Procedure: DIALYSIS/PERMA CATHETER REMOVAL;  Surgeon: Katha Cabal, MD;  Location: [place] [facility];  Service: Cardiovascular;  Laterality: N/A;  . ESOPHAGOGASTRODUODENOSCOPY (EGD) WITH PROPOFOL N/A 09/28/2019   Procedure: ESOPHAGOGASTRODUODENOSCOPY (EGD) WITH PROPOFOL;  Surgeon: Toledo, Benay Pike, MD;  Location: [facility];  Service: Gastroenterology;  Laterality: N/A;  . KIDNEY SURGERY  01/21/2016   IR NEPHROSTOMY PLACEMENT LEFT   . PEG PLACEMENT N/A 09/20/2019   Procedure: PERCUTANEOUS ENDOSCOPIC GASTROSTOMY (PEG) PLACEMENT;  Surgeon: Toledo, Benay Pike, MD;  Location: [facility];  Service: Gastroenterology;  Laterality: N/A;  . PERIPHERAL VASCULAR CATHETERIZATION N/A 04/07/2016   Procedure: Glori Luis Cath Insertion;  Surgeon: Katha Cabal, MD;  Location: [place] [facility];  Service: Cardiovascular;  Laterality: N/A;  . PERIPHERAL VASCULAR THROMBECTOMY Left  06/02/2017   Procedure: PERIPHERAL VASCULAR THROMBECTOMY;  Surgeon: Algernon Huxley, MD;  Location: [place] [facility];  Service: Cardiovascular;  Laterality: Left;  . PERIPHERAL VASCULAR THROMBECTOMY Left 01/28/2018   Procedure: PERIPHERAL VASCULAR THROMBECTOMY;  Surgeon: Katha Cabal, MD;  Location: [place] [facility];  Service: Cardiovascular;  Laterality: Left;  . PERIPHERAL VASCULAR THROMBECTOMY Left 03/14/2018   Procedure: PERIPHERAL VASCULAR THROMBECTOMY;  Surgeon: Algernon Huxley, MD;  Location: [place] [facility];  Service: Cardiovascular;  Laterality: Left;  . PERIPHERAL VASCULAR THROMBECTOMY Left 03/16/2018   Procedure: PERIPHERAL VASCULAR THROMBECTOMY;  Surgeon: Katha Cabal, MD;  Location: [place] [facility];  Service: Cardiovascular;  Laterality: Left;  . PORTA CATH REMOVAL N/A 02/07/2019   Procedure: PORTA CATH REMOVAL;  Surgeon: Katha Cabal, MD;  Location: [place] [facility];  Service: Cardiovascular;  Laterality: N/A;  . PORTACATH PLACEMENT Right   . ROTATOR CUFF REPAIR Left   . TEMPORARY DIALYSIS CATHETER N/A 05/11/2018   Procedure: TEMPORARY DIALYSIS CATHETER;  Surgeon: Katha Cabal, MD;  Location: [place] [facility];  Service: Cardiovascular;  Laterality: N/A;  . TEMPORARY DIALYSIS CATHETER Left 07/27/2018   Procedure: TEMPORARY DIALYSIS CATHETER;  Surgeon: Katha Cabal, MD;  Location: [place] [facility];  Service: Cardiovascular;  Laterality: Left;  . TOTAL HIP ARTHROPLASTY Left 09/05/2019   Procedure: TOTAL HIP ARTHROPLASTY;  Surgeon:  Poggi, Marshall Cork, MD;  Location: [facility];  Service: Orthopedics;  Laterality: Left;  . TRANSURETHRAL RESECTION OF BLADDER TUMOR N/A 02/17/2016   Procedure: TRANSURETHRAL RESECTION OF BLADDER TUMOR (TURBT)-LARGE;  Surgeon: Hollice Espy, MD;  Location: [facility];  Service: Urology;  Laterality: N/A;  . TRANSURETHRAL RESECTION OF BLADDER TUMOR N/A 10/07/2017   Procedure: TRANSURETHRAL RESECTION OF BLADDER TUMOR  (TURBT)-small;  Surgeon: Hollice Espy, MD;  Location: [facility];  Service: Urology;  Laterality: N/A;  . URETEROSCOPY Left 02/17/2016   Procedure: URETEROSCOPY;  Surgeon: Hollice Espy, MD;  Location: [facility];  Service: Urology;  Laterality: Left;  . URETEROSCOPY Right 10/12/2016   Procedure: URETEROSCOPY;  Surgeon: Hollice Espy, MD;  Location: [facility];  Service: Urology;  Laterality: Right;     A IV Location/Drains/Wounds Patient Lines/Drains/Airways Status   Active Line/Drains/Airways    Name:   Placement date:   Placement time:   Site:   Days:   Peripheral IV 10/13/19 Right Antecubital   10/13/19    2045    Antecubital   less than 1   Fistula / Graft Left Upper arm Arteriovenous vein graft   02/17/17    1111    Upper arm   968   Sheath 07/29/18 Left Venous   07/29/18    1516    Venous   441   Sheath 07/29/18 Left Venous   07/29/18    1523    Venous   441   Gastrostomy/Enterostomy Percutaneous endoscopic gastrostomy (PEG) 20 Fr. LUQ   09/20/19    1500    LUQ   23   Pressure Injury 09/21/19 Buttocks Right Stage 2 -  Partial thickness loss of dermis presenting as a shallow open injury with a red, pink wound bed without slough. 0.5cmx0.5cm   09/21/19    0830     22          Intake/Output Last 24 hours No intake or output data in the 24 hours ending 10/13/19 2225  Labs/Imaging Results for orders placed or performed during the hospital encounter of 10/13/19 (from the past 48 hour(s))  CBC     Status: Abnormal   Collection Time: 10/13/19  8:40 PM  Result Value Ref Range   WBC 11.0 (H) 4.0 - 10.5 K/uL   RBC 3.06 (L) 3.87 - 5.11 MIL/uL   Hemoglobin 9.0 (L) 12.0 - 15.0 g/dL   HCT 28.5 (L) 36.0 - 46.0 %   MCV 93.1 80.0 - 100.0 fL   MCH 29.4 26.0 - 34.0 pg   MCHC 31.6 30.0 - 36.0 g/dL   RDW 17.6 (H) 11.5 - 15.5 %   Platelets 241 150 - 400 K/uL   nRBC 0.0 0.0 - 0.2 %    Comment: Performed at [facility], [address] [place], [place] [address]  Comprehensive  metabolic panel     Status: Abnormal   Collection Time: 10/13/19  8:40 PM  Result Value Ref Range   Sodium 139 135 - 145 mmol/L   Potassium 4.9 3.5 - 5.1 mmol/L    Comment: HEMOLYSIS AT THIS LEVEL MAY AFFECT RESULT   Chloride 94 (L) 98 - 111 mmol/L   CO2 25 22 - 32 mmol/L   Glucose, Bld 116 (H) 70 - 99 mg/dL    Comment: Glucose reference range applies only to samples taken after fasting for at least 8 hours.   BUN 145 (H) 8 - 23 mg/dL    Comment: RESULT CONFIRMED BY MANUAL DILUTION MJU  Creatinine, Ser 12.22 (H) 0.44 - 1.00 mg/dL   Calcium 9.4 8.9 - 10.3 mg/dL   Total Protein 7.6 6.5 - 8.1 g/dL   Albumin 3.1 (L) 3.5 - 5.0 g/dL   AST 21 15 - 41 U/L   ALT 10 0 - 44 U/L   Alkaline Phosphatase 86 38 - 126 U/L   Total Bilirubin 0.6 0.3 - 1.2 mg/dL   GFR calc non Af Amer 3 (L) >60 mL/min   GFR calc Af Amer 3 (L) >60 mL/min   Anion gap 20 (H) 5 - 15    Comment: Performed at [facility], [address] [place], [place] [address]  Protime-INR     Status: None   Collection Time: 10/13/19  8:40 PM  Result Value Ref Range   Prothrombin Time 12.9 11.4 - 15.2 seconds   INR 1.0 0.8 - 1.2    Comment: (NOTE) INR goal varies based on device and disease states. Performed at [facility], [address] [place], [place] [address]    DG Chest 1 View  Result Date: 10/13/2019 CLINICAL DATA:  79 year old female with left hip and leg pain after a fall yesterday. EXAM: CHEST  1 VIEW COMPARISON:  Chest CTA 10/03/2019 and earlier. FINDINGS: Supine AP view at 1938 hours. The patient is more rotated to the right. Chronic left innominate vein and axillary vascular stents. Stable mild cardiomegaly and mediastinal contours. Allowing for portable technique the lungs are clear. Visualized tracheal air column is within normal limits. No acute osseous abnormality identified. IMPRESSION: No acute cardiopulmonary abnormality. Electronically Signed   By: Genevie Ann M.D.   On: 10/13/2019 20:13    DG HIP UNILAT WITH PELVIS 2-3 VIEWS LEFT  Result Date: 10/13/2019 CLINICAL DATA:  79 year old female with left hip and leg pain after a fall yesterday. EXAM: DG HIP (WITH OR WITHOUT PELVIS) 2-3V LEFT COMPARISON:  Postoperative left hip radiographs 09/05/2019. 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 The femoral head component appears mildly impacted on the acetabular component, but no bone or hardware fracture is identified. Pubic rami appear stable. No pelvis fracture identified. Grossly intact proximal right femur. Pelvic and proximal lower extremity vascular calcifications. Negative visible bowel gas pattern. IMPRESSION: Superiorly dislocated left hip arthroplasty. No fracture identified. Electronically Signed   By: Genevie Ann M.D.   On: 10/13/2019 20:11    Pending Labs Unresulted Labs (From admission, onward)    Start     Ordered   10/13/19 2102  Respiratory Panel by RT PCR (Flu A&B, Covid) - Nasopharyngeal Swab  (Tier 2 Respiratory Panel by RT PCR (Flu A&B, Covid) (TAT 2 hrs))  Once,   STAT    Question Answer Comment  Is this test for diagnosis or screening Screening   Symptomatic for COVID-19 as defined by CDC No   Hospitalized for COVID-19 No   Admitted to ICU for COVID-19 No   Previously tested for COVID-19 Yes   Resident in a congregate (group) care setting No   Employed in healthcare setting No   Pregnant No      10/13/19 2101          Vitals/Pain Today's Vitals   10/13/19 1913 10/13/19 2115 10/13/19 2130 10/13/19 2200  BP:   (!) 144/60 133/80  Pulse:  75 77 79  Resp:  18 18 (!) 22  Temp:      TempSrc:      SpO2:  95% 93% 96%  Weight: 76 kg  Height: 5' 4"  (1.626 m)       Isolation Precautions No active isolations  Medications Medications  sodium chloride 0.9 % bolus 500 mL (0 mLs Intravenous Hold 10/13/19 2110)    Mobility non-ambulatory High fall risk   Focused Assessments Cardiac Assessment Handoff:    Lab  Results  Component Value Date   CKTOTAL 255 (H) 08/03/2012   CKMB < 0.5 (L) 08/03/2012   TROPONINI <0.03 01/30/2018   No results found for: DDIMER Does the Patient currently have chest pain? No     R Recommendations: See Admitting Provider Note  Report given to:   Additional Notes:

## 2019-10-13 NOTE — ED Triage Notes (Signed)
Patient arrives from [facility] with complaints of L hip/leg pain after a fall yesterday. Patient reports increased pain. Patient is on dialysis

## 2019-10-13 NOTE — ED Notes (Addendum)
Updated patient's husband about plan of care, admission   Patient provided phone to call husband

## 2019-10-13 NOTE — H&P (Signed)
History and Physical   Barbara [name] ZDG:[id number] DOB: 1941-04-29 DOA: 10/13/2019  Referring MD/NP/PA: Dr. Leonides Schanz  PCP: [name], MD   Outpatient Specialists: Dr. Roland Rack, orthopedics  Patient coming from: Skilled nursing facility  Chief Complaint: Fall with left hip pain  HPI: Barbara [name] is a 79 y.o. female with medical history significant of diabetes, coronary artery disease, end-stage renal disease on hemodialysis on Tuesdays Thursdays and Saturdays, chronic bronchitis, GERD, hypertension, O2 dependence, osteoarthritis who had left total hip replacement on September 05, 2019.  She was in a skilled facility where she had a fall.  Patient sustained a periprosthetic dislocation.  She was seen in the ER and attempted reduction failed as patient is too risky to sedate.  Orthopedics consulted and Dr. Posey Pronto plans to do reduction in the OR in the morning.  She is being admitted to the hospital under medical service pending reduction tomorrow.  She is not able to give adequate history.  She is weak dehydrated and debilitated.  Appears to be in pain..  ED Course: Temperature 98.2 blood pressure 1 5453 pulse 79 respirate of 22 oxygen sat 93% on room air.  White count 11 hemoglobin 9.1 platelets 241.  Sodium 139 potassium 4.9 chloride 94 CO2 25 BUN 145 and creatinine 12.22 calcium 9.4 INR 1.0.  Glucose 116.  Chest x-ray showed no acute findings.  X-ray of the left hip showed superiorly dislocated left hip arthroplasty no fracture identified.  Patient being admitted to the hospital for intraoperative reduction.  Review of Systems: As per HPI otherwise 10 point review of systems negative.  COVID-19 is negative.   Past Medical History:  Diagnosis Date  . Adult celiac disease   . Anemia associated with chronic renal failure 2017   blood transfusion last week 10/17  . Arthritis   . Asthma   . 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 Chronic kidney disease   . CKD (chronic kidney disease)     stage IV kidney disease.  dr. Candiss Norse and dr. Holley Raring follow her  . COPD (chronic obstructive pulmonary disease) ([place])   . Coronary artery disease   . Diabetes mellitus without complication ([place])   . Dialysis patient ([place])    Tues, Thurs, Sat  . Dyspnea    with exertion  . Elevated lipids   . GERD (gastroesophageal reflux disease)   . Hematuria   . Hypertension   . Lower back pain   . Neuropathy   . Oxygen dependent    at hs  . Personal history of chemotherapy 2017   bladder ca  . Personal history of radiation therapy 2017   bladder ca  . Sleep apnea    uses cpap  . Urinary obstruction 01/2016    Past Surgical History:  Procedure Laterality Date  . A/V SHUNT INTERVENTION N/A 05/12/2018   Procedure: A/V SHUNT INTERVENTION;  Surgeon: Algernon Huxley, MD;  Location: [facility];  Service: Cardiovascular;  Laterality: N/A;  . A/V SHUNT INTERVENTION Left 07/29/2018   Procedure: A/V SHUNT INTERVENTION;  Surgeon: Katha Cabal, MD;  Location: [facility];  Service: Cardiovascular;  Laterality: Left;  . ABDOMINAL HYSTERECTOMY    . AV FISTULA PLACEMENT Left 02/17/2017   Procedure: INSERTION OF ARTERIOVENOUS (AV) GORE-TEX GRAFT ARM(BRACHIAL AXILLARY);  Surgeon: Katha Cabal, MD;  Location: [facility];  Service: Vascular;  Laterality: Left;  . CYSTOSCOPY W/ RETROGRADES Bilateral 02/17/2016   Procedure: CYSTOSCOPY WITH RETROGRADE PYELOGRAM;  Surgeon: Caryl Pina  Erlene Quan, MD;  Location: [facility];  Service: Urology;  Laterality: Bilateral;  . CYSTOSCOPY W/ RETROGRADES Bilateral 10/12/2016   Procedure: CYSTOSCOPY WITH RETROGRADE PYELOGRAM;  Surgeon: Hollice Espy, MD;  Location: [facility];  Service: Urology;  Laterality: Bilateral;  . CYSTOSCOPY W/ RETROGRADES Bilateral 06/09/2017   Procedure: CYSTOSCOPY WITH RETROGRADE PYELOGRAM;  Surgeon: Hollice Espy, MD;  Location: [facility];  Service: Urology;  Laterality: Bilateral;  . CYSTOSCOPY W/ RETROGRADES Bilateral 10/07/2017    Procedure: CYSTOSCOPY WITH RETROGRADE PYELOGRAM;  Surgeon: Hollice Espy, MD;  Location: [facility];  Service: Urology;  Laterality: Bilateral;  . CYSTOSCOPY W/ URETERAL STENT PLACEMENT Left 05/12/2016   Procedure: CYSTOSCOPY WITH STENT REPLACEMENT;  Surgeon: Hollice Espy, MD;  Location: [facility];  Service: Urology;  Laterality: Left;  . CYSTOSCOPY W/ URETERAL STENT PLACEMENT Left 10/12/2016   Procedure: CYSTOSCOPY WITH STENT REPLACEMENT;  Surgeon: Hollice Espy, MD;  Location: [facility];  Service: Urology;  Laterality: Left;  . CYSTOSCOPY WITH BIOPSY N/A 06/09/2017   Procedure: CYSTOSCOPY WITH BLADDER BIOPSY;  Surgeon: Hollice Espy, MD;  Location: [facility];  Service: Urology;  Laterality: N/A;  . CYSTOSCOPY WITH STENT PLACEMENT Left 01/21/2016   Procedure: CYSTOSCOPY WITH double J STENT PLACEMENT;  Surgeon: Franchot Gallo, MD;  Location: [facility];  Service: Urology;  Laterality: Left;  . CYSTOSCOPY WITH STENT PLACEMENT Right 10/12/2016   Procedure: CYSTOSCOPY WITH STENT PLACEMENT;  Surgeon: Hollice Espy, MD;  Location: [facility];  Service: Urology;  Laterality: Right;  . DIALYSIS/PERMA CATHETER INSERTION N/A 11/20/2016   Procedure: Dialysis/Perma Catheter Insertion;  Surgeon: Algernon Huxley, MD;  Location: [facility];  Service: Cardiovascular;  Laterality: N/A;  . DIALYSIS/PERMA CATHETER REMOVAL N/A 03/30/2017   Procedure: DIALYSIS/PERMA CATHETER REMOVAL;  Surgeon: Katha Cabal, MD;  Location: [facility];  Service: Cardiovascular;  Laterality: N/A;  . ESOPHAGOGASTRODUODENOSCOPY (EGD) WITH PROPOFOL N/A 09/28/2019   Procedure: ESOPHAGOGASTRODUODENOSCOPY (EGD) WITH PROPOFOL;  Surgeon: Toledo, Benay Pike, MD;  Location: [facility];  Service: Gastroenterology;  Laterality: N/A;  . KIDNEY SURGERY  01/21/2016   IR NEPHROSTOMY PLACEMENT LEFT   . PEG PLACEMENT N/A 09/20/2019   Procedure: PERCUTANEOUS ENDOSCOPIC GASTROSTOMY (PEG) PLACEMENT;  Surgeon: Toledo, Benay Pike, MD;   Location: [facility];  Service: Gastroenterology;  Laterality: N/A;  . PERIPHERAL VASCULAR CATHETERIZATION N/A 04/07/2016   Procedure: Glori Luis Cath Insertion;  Surgeon: Katha Cabal, MD;  Location: [facility];  Service: Cardiovascular;  Laterality: N/A;  . PERIPHERAL VASCULAR THROMBECTOMY Left 06/02/2017   Procedure: PERIPHERAL VASCULAR THROMBECTOMY;  Surgeon: Algernon Huxley, MD;  Location: [facility];  Service: Cardiovascular;  Laterality: Left;  . PERIPHERAL VASCULAR THROMBECTOMY Left 01/28/2018   Procedure: PERIPHERAL VASCULAR THROMBECTOMY;  Surgeon: Katha Cabal, MD;  Location: [facility];  Service: Cardiovascular;  Laterality: Left;  . PERIPHERAL VASCULAR THROMBECTOMY Left 03/14/2018   Procedure: PERIPHERAL VASCULAR THROMBECTOMY;  Surgeon: Algernon Huxley, MD;  Location: [facility];  Service: Cardiovascular;  Laterality: Left;  . PERIPHERAL VASCULAR THROMBECTOMY Left 03/16/2018   Procedure: PERIPHERAL VASCULAR THROMBECTOMY;  Surgeon: Katha Cabal, MD;  Location: [facility];  Service: Cardiovascular;  Laterality: Left;  . PORTA CATH REMOVAL N/A 02/07/2019   Procedure: PORTA CATH REMOVAL;  Surgeon: Katha Cabal, MD;  Location: [facility];  Service: Cardiovascular;  Laterality: N/A;  . PORTACATH PLACEMENT Right   . ROTATOR CUFF REPAIR Left   . TEMPORARY DIALYSIS CATHETER N/A 05/11/2018   Procedure: TEMPORARY  DIALYSIS CATHETER;  Surgeon: Katha Cabal, MD;  Location: [facility];  Service: Cardiovascular;  Laterality: N/A;  . TEMPORARY DIALYSIS CATHETER Left 07/27/2018   Procedure: TEMPORARY DIALYSIS CATHETER;  Surgeon: Katha Cabal, MD;  Location: [facility];  Service: Cardiovascular;  Laterality: Left;  . TOTAL HIP ARTHROPLASTY Left 09/05/2019   Procedure: TOTAL HIP ARTHROPLASTY;  Surgeon: Corky Mull, MD;  Location: [facility];  Service: Orthopedics;  Laterality: Left;  . TRANSURETHRAL  RESECTION OF BLADDER TUMOR N/A 02/17/2016   Procedure: TRANSURETHRAL RESECTION OF BLADDER TUMOR (TURBT)-LARGE;  Surgeon: Hollice Espy, MD;  Location: [facility];  Service: Urology;  Laterality: N/A;  . TRANSURETHRAL RESECTION OF BLADDER TUMOR N/A 10/07/2017   Procedure: TRANSURETHRAL RESECTION OF BLADDER TUMOR (TURBT)-small;  Surgeon: Hollice Espy, MD;  Location: [facility];  Service: Urology;  Laterality: N/A;  . URETEROSCOPY Left 02/17/2016   Procedure: URETEROSCOPY;  Surgeon: Hollice Espy, MD;  Location: [facility];  Service: Urology;  Laterality: Left;  . URETEROSCOPY Right 10/12/2016   Procedure: URETEROSCOPY;  Surgeon: Hollice Espy, MD;  Location: [facility];  Service: Urology;  Laterality: Right;     reports that she quit smoking about 17 years ago. Her smoking use included cigarettes. She has never used smokeless tobacco. She reports that she does not drink alcohol or use drugs.  Allergies  Allergen Reactions  . Glipizide Er Other (See Comments)    Hypoglycemia   . Percocet [Oxycodone-Acetaminophen] Hives    Family History  Problem Relation Age of Onset  . Diabetes Mother   . Cancer Father   . Breast cancer Neg Hx   . Kidney disease Neg Hx      Prior to Admission medications   Medication Sig Start Date End Date Taking? Authorizing Provider  albuterol (PROVENTIL HFA;VENTOLIN HFA) 108 (90 Base) MCG/ACT inhaler Inhale 2 puffs into the lungs every 6 (six) hours as needed for wheezing or shortness of breath.  05/16/18   [provider]  Amino Acids-Protein Hydrolys (FEEDING SUPPLEMENT, PRO-STAT SUGAR FREE 64,) LIQD Place 30 mLs into feeding tube 2 (two) times daily. 09/24/19   Nolberto Hanlon, MD  amLODipine (NORVASC) 5 MG tablet Place 1 tablet (5 mg total) into feeding tube daily. 09/24/19   Nolberto Hanlon, MD  atorvastatin (LIPITOR) 40 MG tablet Place 40 mg into feeding tube at bedtime.  05/01/19   [provider]  B Complex-C (B-COMPLEX WITH VITAMIN C) tablet Place 1  tablet into feeding tube at bedtime. 09/24/19   Nolberto Hanlon, MD  Blood Glucose Monitoring Suppl (ACCU-CHEK AVIVA PLUS) w/Device KIT (USE TO MONITOR BLOOD SUGARS.) 12/30/17   [provider]  Calcium Acetate 667 MG TABS Place 1,334 mg into feeding tube 3 (three) times daily.    [provider]  calcium carbonate (TUMS - DOSED IN MG ELEMENTAL CALCIUM) 500 MG chewable tablet Place 1 tablet into feeding tube at bedtime.     [provider]  cholecalciferol (VITAMIN D) 1000 units tablet Take 1 tablet (1,000 Units total) by mouth daily. 09/24/19   Nolberto Hanlon, MD  epoetin alfa (EPOGEN) 10000 UNIT/ML injection Inject 1 mL (10,000 Units total) into the vein every Monday, Wednesday, and Friday with hemodialysis. 09/25/19   Nolberto Hanlon, MD  esomeprazole (NEXIUM) 40 MG packet 40 mg daily before breakfast. Per tube    [provider]  ferrous sulfate 300 (60 Fe) MG/5ML syrup Place 324 mg into feeding tube at bedtime.    [provider]  gabapentin (  NEURONTIN) 300 MG capsule Place 2 capsules (600 mg total) into feeding tube 3 (three) times daily. Patient taking differently: Place 300 mg into feeding tube 3 (three) times daily.  09/24/19   Nolberto Hanlon, MD  haloperidol (HALDOL) 1 MG tablet Take 1 tablet (1 mg total) by mouth in the morning. 10/11/19 11/10/19  Earleen Newport, MD  heparin 5000 UNIT/ML injection Inject 1 mL (5,000 Units total) into the skin every 12 (twelve) hours. 09/24/19   Nolberto Hanlon, MD  lacosamide 100 MG TABS Place 1 tablet (100 mg total) into feeding tube 2 (two) times daily. 09/24/19   Nolberto Hanlon, MD  multivitamin (RENA-VIT) TABS tablet Place 1 tablet into feeding tube at bedtime. 09/24/19   Nolberto Hanlon, MD  NON FORMULARY Place 1 Units into the nose at bedtime. CPAP Time of use 2100-0600    [provider]  oxybutynin (DITROPAN) 5 MG tablet Place 1 tablet (5 mg total) into feeding tube 3 (three) times daily. 09/24/19   Nolberto Hanlon, MD  OXYGEN  Inhale 2 L into the lungs at bedtime.    [provider]  Water For Irrigation, Sterile (FREE WATER) SOLN Place 50 mLs into feeding tube every 4 (four) hours. Patient not taking: Reported on 09/27/2019 09/24/19   Nolberto Hanlon, MD    Physical Exam: Vitals:   10/13/19 2115 10/13/19 2130 10/13/19 2200 10/13/19 2230  BP:  (!) 144/60 133/80 (!) 154/53  Pulse: 75 77 79 77  Resp: 18 18 (!) 22 20  Temp:      TempSrc:      SpO2: 95% 93% 96% 95%  Weight:      Height:          Constitutional: Confused laying in bed in mild distress Vitals:   10/13/19 2115 10/13/19 2130 10/13/19 2200 10/13/19 2230  BP:  (!) 144/60 133/80 (!) 154/53  Pulse: 75 77 79 77  Resp: 18 18 (!) 22 20  Temp:      TempSrc:      SpO2: 95% 93% 96% 95%  Weight:      Height:       Eyes: PERRL, lids and conjunctivae normal ENMT: Mucous membranes are dry. Posterior pharynx clear of any exudate or lesions.Normal dentition.  Neck: normal, supple, no masses, no thyromegaly Respiratory: clear to auscultation bilaterally, no wheezing, no crackles. Normal respiratory effort. No accessory muscle use.  Cardiovascular: Regular rate and rhythm, no murmurs / rubs / gallops. No extremity edema. 2+ pedal pulses. No carotid bruits.  Abdomen: no tenderness, no masses palpated. No hepatosplenomegaly. Bowel sounds positive.  Musculoskeletal: no clubbing / cyanosis.  Medially rotated left lower extremity with shortening, ecchymosis Skin: no rashes, lesions, ulcers. No induration Neurologic: CN 2-12 grossly intact. Sensation intact, DTR normal. Strength 5/5 in all 4.  Psychiatric: Drowsy, not alert but awake.    Labs on Admission: I have personally reviewed following labs and imaging studies  CBC: Recent Labs  Lab 10/07/19 1142 10/08/19 0759 10/10/19 1054 10/11/19 0843 10/13/19 2040  WBC 20.7* 18.5* 16.0* 14.4* 11.0*  NEUTROABS  --   --   --  12.2*  --   HGB 9.8* 10.0* 9.9* 9.1* 9.0*  HCT 30.8* 31.6* 30.7* 28.3* 28.5*   MCV 93.3 92.1 90.6 91.9 93.1  PLT 429* 359 267 281 630   Basic Metabolic Panel: Recent Labs  Lab 10/07/19 1142 10/07/19 1142 10/08/19 0759 10/09/19 0046 10/10/19 1054 10/11/19 0843 10/13/19 2040  NA 142   < > 141 142  140 140 139  K 3.2*   < > 3.3* 3.4* 3.7 3.3* 4.9  CL 96*   < > 98 99 96* 100 94*  CO2 27   < > 26 27 27 25 25   GLUCOSE 134*   < > 171* 139* 134* 136* 116*  BUN 156*   < > 88* 115* 104* 112* 145*  CREATININE 8.68*   < > 6.15* 7.25* 8.54* 8.00* 12.22*  CALCIUM 8.8*   < > 8.9 8.8* 8.7* 8.8* 9.4  PHOS 4.3  --   --  4.7*  --   --   --    < > = values in this interval not displayed.   GFR: Estimated Creatinine Clearance: 3.7 mL/min (A) (by C-G formula based on SCr of 12.22 mg/dL (H)). Liver Function Tests: Recent Labs  Lab 10/07/19 1142 10/09/19 0046 10/11/19 0843 10/13/19 2040  AST  --   --  17 21  ALT  --   --  17 10  ALKPHOS  --   --  76 86  BILITOT  --   --  0.7 0.6  PROT  --   --  7.1 7.6  ALBUMIN 3.1* 3.1* 3.0* 3.1*   No results for input(s): LIPASE, AMYLASE in the last 168 hours. No results for input(s): AMMONIA in the last 168 hours. Coagulation Profile: Recent Labs  Lab 10/13/19 2040  INR 1.0   Cardiac Enzymes: No results for input(s): CKTOTAL, CKMB, CKMBINDEX, TROPONINI in the last 168 hours. BNP (last 3 results) No results for input(s): PROBNP in the last 8760 hours. HbA1C: No results for input(s): HGBA1C in the last 72 hours. CBG: Recent Labs  Lab 10/09/19 1935 10/10/19 0040 10/10/19 0324 10/10/19 0802 10/10/19 1121  GLUCAP 119* 164* 148* 138* 147*   Lipid Profile: No results for input(s): CHOL, HDL, LDLCALC, TRIG, CHOLHDL, LDLDIRECT in the last 72 hours. Thyroid Function Tests: No results for input(s): TSH, T4TOTAL, FREET4, T3FREE, THYROIDAB in the last 72 hours. Anemia Panel: No results for input(s): VITAMINB12, FOLATE, FERRITIN, TIBC, IRON, RETICCTPCT in the last 72 hours. Urine analysis:    Component Value Date/Time    COLORURINE YELLOW (A) 01/30/2018 2055   APPEARANCEUR Cloudy (A) 02/28/2019 0902   LABSPEC 1.015 01/30/2018 2055   LABSPEC 1.012 08/02/2012 1114   PHURINE 7.0 01/30/2018 2055   GLUCOSEU Negative 02/28/2019 0902   GLUCOSEU Negative 08/02/2012 1114   HGBUR LARGE (A) 01/30/2018 2055   BILIRUBINUR Negative 02/28/2019 0902   BILIRUBINUR Negative 08/02/2012 1114   KETONESUR NEGATIVE 01/30/2018 2055   PROTEINUR 2+ (A) 02/28/2019 0902   PROTEINUR 100 (A) 01/30/2018 2055   NITRITE Negative 02/28/2019 0902   NITRITE NEGATIVE 01/30/2018 2055   LEUKOCYTESUR Trace (A) 02/28/2019 0902   LEUKOCYTESUR Negative 08/02/2012 1114   Sepsis Labs: @LABRCNTIP (procalcitonin:4,lacticidven:4) ) Recent Results (from the past 240 hour(s))  SARS CORONAVIRUS 2 (TAT 6-24 HRS) Nasopharyngeal Nasopharyngeal Swab     Status: None   Collection Time: 10/05/19  3:11 PM   Specimen: Nasopharyngeal Swab  Result Value Ref Range Status   SARS Coronavirus 2 NEGATIVE NEGATIVE Corrected    Comment: RESULT CALLED TO, READ BACK BY AND VERIFIED WITH: THIS IS A CORRECTED REPORT. PREVIOUSLY CALLED REPORT WAS POSITIVE. CALLED TO [facility] RN 10/06/19 1755 JDW Performed at [facility] [address], [place],  [address] CORRECTED ON 03/19 AT 4665: PREVIOUSLY REPORTED AS POSITIVE RESULT CALLED TO, READ BACK BY AND VERIFIED WITH: RN  TINA MICHAEL AT 0440 BY  [place] ON 10/06/2019   Respiratory Panel by RT PCR (Flu A&B, Covid) - Nasopharyngeal Swab     Status: None   Collection Time: 10/10/19  1:20 PM   Specimen: Nasopharyngeal Swab  Result Value Ref Range Status   SARS Coronavirus 2 by RT PCR NEGATIVE NEGATIVE Final    Comment: (NOTE) SARS-CoV-2 target nucleic acids are NOT DETECTED. The SARS-CoV-2 RNA is generally detectable in upper respiratoy specimens during the acute phase of infection. The lowest concentration of SARS-CoV-2 viral copies this assay can detect is 131 copies/mL. A negative result does  not preclude SARS-Cov-2 infection and should not be used as the sole basis for treatment or other patient management decisions. A negative result may occur with  improper specimen collection/handling, submission of specimen other than nasopharyngeal swab, presence of viral mutation(s) within the areas targeted by this assay, and inadequate number of viral copies (<131 copies/mL). A negative result must be combined with clinical observations, patient history, and epidemiological information. The expected result is Negative. Fact Sheet for Patients:  PinkCheek.be Fact Sheet for Healthcare Providers:  GravelBags.it This test is not yet ap proved or cleared by the [place] FDA and  has been authorized for detection and/or diagnosis of SARS-CoV-2 by FDA under an Emergency Use Authorization (EUA). This EUA will remain  in effect (meaning this test can be used) for the duration of the COVID-19 declaration under Section 564(b)(1) of the Act, 21 U.S.C. section 360bbb-3(b)(1), unless the authorization is terminated or revoked sooner.    Influenza A by PCR NEGATIVE NEGATIVE Final   Influenza B by PCR NEGATIVE NEGATIVE Final    Comment: (NOTE) The Xpert Xpress SARS-CoV-2/FLU/RSV assay is intended as an aid in  the diagnosis of influenza from Nasopharyngeal swab specimens and  should not be used as a sole basis for treatment. Nasal washings and  aspirates are unacceptable for Xpert Xpress SARS-CoV-2/FLU/RSV  testing. Fact Sheet for Patients: PinkCheek.be Fact Sheet for Healthcare Providers: GravelBags.it This test is not yet approved or cleared by the [place] FDA and  has been authorized for detection and/or diagnosis of SARS-CoV-2 by  FDA under an Emergency Use Authorization (EUA). This EUA will remain  in effect (meaning this test can be used) for the duration of the   Covid-19 declaration under Section 564(b)(1) of the Act, 21  U.S.C. section 360bbb-3(b)(1), unless the authorization is  terminated or revoked. Performed at [facility], [address] [place],  [address]   Respiratory Panel by RT PCR (Flu A&B, Covid) - Nasopharyngeal Swab     Status: None   Collection Time: 10/13/19 10:09 PM   Specimen: Nasopharyngeal Swab  Result Value Ref Range Status   SARS Coronavirus 2 by RT PCR NEGATIVE NEGATIVE Final    Comment: (NOTE) SARS-CoV-2 target nucleic acids are NOT DETECTED. The SARS-CoV-2 RNA is generally detectable in upper respiratoy specimens during the acute phase of infection. The lowest concentration of SARS-CoV-2 viral copies this assay can detect is 131 copies/mL. A negative result does not preclude SARS-Cov-2 infection and should not be used as the sole basis for treatment or other patient management decisions. A negative result may occur with  improper specimen collection/handling, submission of specimen other than nasopharyngeal swab, presence of viral mutation(s) within the areas targeted by this assay, and inadequate number of viral copies (<131 copies/mL). A negative result must be combined with clinical observations, patient history, and epidemiological information. The expected result is Negative. Fact Sheet for Patients:  PinkCheek.be  Fact Sheet for Healthcare Providers:  GravelBags.it This test is not yet ap proved or cleared by the [place] FDA and  has been authorized for detection and/or diagnosis of SARS-CoV-2 by FDA under an Emergency Use Authorization (EUA). This EUA will remain  in effect (meaning this test can be used) for the duration of the COVID-19 declaration under Section 564(b)(1) of the Act, 21 U.S.C. section 360bbb-3(b)(1), unless the authorization is terminated or revoked sooner.    Influenza A by PCR NEGATIVE NEGATIVE Final    Influenza B by PCR NEGATIVE NEGATIVE Final    Comment: (NOTE) The Xpert Xpress SARS-CoV-2/FLU/RSV assay is intended as an aid in  the diagnosis of influenza from Nasopharyngeal swab specimens and  should not be used as a sole basis for treatment. Nasal washings and  aspirates are unacceptable for Xpert Xpress SARS-CoV-2/FLU/RSV  testing. Fact Sheet for Patients: PinkCheek.be Fact Sheet for Healthcare Providers: GravelBags.it This test is not yet approved or cleared by the [place] FDA and  has been authorized for detection and/or diagnosis of SARS-CoV-2 by  FDA under an Emergency Use Authorization (EUA). This EUA will remain  in effect (meaning this test can be used) for the duration of the  Covid-19 declaration under Section 564(b)(1) of the Act, 21  U.S.C. section 360bbb-3(b)(1), unless the authorization is  terminated or revoked. Performed at [facility], [address] [place], [place] [address]      Radiological Exams on Admission: DG Chest 1 View  Result Date: 10/13/2019 CLINICAL DATA:  79 year old female with left hip and leg pain after a fall yesterday. EXAM: CHEST  1 VIEW COMPARISON:  Chest CTA 10/03/2019 and earlier. FINDINGS: Supine AP view at 1938 hours. The patient is more rotated to the right. Chronic left innominate vein and axillary vascular stents. Stable mild cardiomegaly and mediastinal contours. Allowing for portable technique the lungs are clear. Visualized tracheal air column is within normal limits. No acute osseous abnormality identified. IMPRESSION: No acute cardiopulmonary abnormality. Electronically Signed   By: Genevie Ann M.D.   On: 10/13/2019 20:13   DG HIP UNILAT WITH PELVIS 2-3 VIEWS LEFT  Result Date: 10/13/2019 CLINICAL DATA:  79 year old female with left hip and leg pain after a fall yesterday. EXAM: DG HIP (WITH OR WITHOUT PELVIS) 2-3V LEFT COMPARISON:  Postoperative left hip  radiographs 09/05/2019. 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 The femoral head component appears mildly impacted on the acetabular component, but no bone or hardware fracture is identified. Pubic rami appear stable. No pelvis fracture identified. Grossly intact proximal right femur. Pelvic and proximal lower extremity vascular calcifications. Negative visible bowel gas pattern. IMPRESSION: Superiorly dislocated left hip arthroplasty. No fracture identified. Electronically Signed   By: Genevie Ann M.D.   On: 10/13/2019 20:11      Assessment/Plan Principal Problem:   Anterior dislocation of left hip (HCC) Active Problems:   COPD (chronic obstructive pulmonary disease) (HCC)   Controlled type 2 diabetes mellitus with stage 4 chronic kidney disease, with long-term current use of insulin (HCC)   Essential hypertension   CAD in native artery   Weakness generalized   Protein-calorie malnutrition, severe   ESRD (end stage renal disease) (HCC)   Chronic diastolic CHF (congestive heart failure) ([place])     #1 periprosthetic dislocation: Patient will be admitted.  Pain management.  Keep n.p.o.  No anticoagulation overnight.  Will keep ready for the OR in the morning.  #2 coronary artery disease: Stable.  No distress  #3 essential hypertension: Blood pressure control.  #4 end-stage renal disease: Patient followed by Dr. Candiss Norse.  She is due for hemodialysis tomorrow.   #5 protein calorie malnutrition: Nutrition consult.  #6 diabetes: Sliding scale insulin.  Continue CBG before every meal and nightly.   DVT prophylaxis: Subcu heparin after surgery Code Status: DNR Family Communication: Husband over the phone Disposition Plan: Back to skilled facility Consults called: Dr. Posey Pronto, orthopedics Admission status: Observation  Severity of Illness: The appropriate patient status for this patient is OBSERVATION. Observation status is judged to be reasonable and  necessary in order to provide the required intensity of service to ensure the patient's safety. The patient's presenting symptoms, physical exam findings, and initial radiographic and laboratory data in the context of their medical condition is felt to place them at decreased risk for further clinical deterioration. Furthermore, it is anticipated that the patient will be medically stable for discharge from the hospital within 2 midnights of admission. The following factors support the patient status of observation.   " The patient's presenting symptoms include fall with hip pain. " The physical exam findings include laterally rotated left hip. " The initial radiographic and laboratory data are dislocated right hip prosthesis.     Barbette Merino MD [facility] Pager [phone]  If 7PM-7AM, please contact night-coverage [email or web address] Password [email or web address]  10/13/2019, 11:24 PM

## 2019-10-14 ENCOUNTER — Inpatient Hospital Stay: Payer: Medicare Other

## 2019-10-14 ENCOUNTER — Inpatient Hospital Stay: Payer: Medicare Other | Admitting: Anesthesiology

## 2019-10-14 ENCOUNTER — Encounter
Admission: EM | Disposition: A | Discharge status: Skilled Nursing Facility | Payer: Self-pay | Source: Home / Self Care | Attending: Internal Medicine

## 2019-10-14 DIAGNOSIS — L8990 Pressure ulcer of unspecified site, unspecified stage: Secondary | ICD-10-CM | POA: Diagnosis present

## 2019-10-14 HISTORY — PX: HIP CLOSED REDUCTION: SHX983

## 2019-10-14 LAB — GLUCOSE, CAPILLARY
Glucose-Capillary: 101 mg/dL — ABNORMAL HIGH (ref 70–99)
Glucose-Capillary: 101 mg/dL — ABNORMAL HIGH (ref 70–99)
Glucose-Capillary: 102 mg/dL — ABNORMAL HIGH (ref 70–99)
Glucose-Capillary: 83 mg/dL (ref 70–99)

## 2019-10-14 LAB — COMPREHENSIVE METABOLIC PANEL
ALT: 13 U/L (ref 0–44)
AST: 16 U/L (ref 15–41)
Albumin: 2.9 g/dL — ABNORMAL LOW (ref 3.5–5.0)
Alkaline Phosphatase: 82 U/L (ref 38–126)
Anion gap: 21 — ABNORMAL HIGH (ref 5–15)
BUN: 156 mg/dL — ABNORMAL HIGH (ref 8–23)
CO2: 23 mmol/L (ref 22–32)
Calcium: 9.1 mg/dL (ref 8.9–10.3)
Chloride: 94 mmol/L — ABNORMAL LOW (ref 98–111)
Creatinine, Ser: 13.39 mg/dL — ABNORMAL HIGH (ref 0.44–1.00)
GFR calc Af Amer: 3 mL/min — ABNORMAL LOW (ref >60)
GFR calc non Af Amer: 2 mL/min — ABNORMAL LOW (ref >60)
Glucose, Bld: 107 mg/dL — ABNORMAL HIGH (ref 70–99)
Potassium: 4.4 mmol/L (ref 3.5–5.1)
Sodium: 138 mmol/L (ref 135–145)
Total Bilirubin: 0.6 mg/dL (ref 0.3–1.2)
Total Protein: 7.1 g/dL (ref 6.5–8.1)

## 2019-10-14 LAB — CBC
HCT: 29 % — ABNORMAL LOW (ref 36.0–46.0)
Hemoglobin: 9.2 g/dL — ABNORMAL LOW (ref 12.0–15.0)
MCH: 29.3 pg (ref 26.0–34.0)
MCHC: 31.7 g/dL (ref 30.0–36.0)
MCV: 92.4 fL (ref 80.0–100.0)
Platelets: 234 10*3/uL (ref 150–400)
RBC: 3.14 MIL/uL — ABNORMAL LOW (ref 3.87–5.11)
RDW: 17.7 % — ABNORMAL HIGH (ref 11.5–15.5)
WBC: 9.3 10*3/uL (ref 4.0–10.5)
nRBC: 0 % (ref 0.0–0.2)

## 2019-10-14 LAB — SURGICAL PCR SCREEN
MRSA, PCR: POSITIVE — AB
Staphylococcus aureus: POSITIVE — AB

## 2019-10-14 SURGERY — CLOSED REDUCTION, HIP
Anesthesia: General | Site: Hip | Laterality: Left

## 2019-10-14 MED ORDER — LIDOCAINE-PRILOCAINE 2.5-2.5 % EX CREA
1.0000 "application " | TOPICAL_CREAM | CUTANEOUS | Status: DC | PRN
  Filled 2019-10-14: qty 5

## 2019-10-14 MED ORDER — ATORVASTATIN CALCIUM 20 MG PO TABS
40.0000 mg | ORAL_TABLET | Freq: Every day | ORAL | Status: DC
  Administered 2019-10-14 – 2019-10-16 (×3): 40 mg
  Filled 2019-10-14 (×3): qty 2

## 2019-10-14 MED ORDER — FERROUS SULFATE 220 (44 FE) MG/5ML PO ELIX
324.0000 mg | ORAL_SOLUTION | Freq: Every day | ORAL | Status: DC
  Administered 2019-10-15 – 2019-10-17 (×3): 324 mg
  Filled 2019-10-14 (×2): qty 7.36
  Filled 2019-10-14: qty 7.4
  Filled 2019-10-14: qty 7.36

## 2019-10-14 MED ORDER — PENTAFLUOROPROP-TETRAFLUOROETH EX AERO
1.0000 "application " | INHALATION_SPRAY | CUTANEOUS | Status: DC | PRN
  Filled 2019-10-14: qty 30

## 2019-10-14 MED ORDER — AMLODIPINE BESYLATE 5 MG PO TABS
5.0000 mg | ORAL_TABLET | Freq: Every day | ORAL | Status: DC
  Administered 2019-10-15 – 2019-10-16 (×2): 5 mg
  Filled 2019-10-14 (×3): qty 1

## 2019-10-14 MED ORDER — EPOETIN ALFA 10000 UNIT/ML IJ SOLN: 4000.0000 [IU] | INTRAMUSCULAR | Status: DC

## 2019-10-14 MED ORDER — HEPARIN SODIUM (PORCINE) 1000 UNIT/ML DIALYSIS
1000.0000 [IU] | INTRAMUSCULAR | Status: DC | PRN
  Filled 2019-10-14: qty 1

## 2019-10-14 MED ORDER — VITAMIN D 25 MCG (1000 UNIT) PO TABS
1000.0000 [IU] | ORAL_TABLET | Freq: Every day | ORAL | Status: DC
  Administered 2019-10-15 – 2019-10-17 (×3): 1000 [IU] via ORAL
  Filled 2019-10-14 (×3): qty 1

## 2019-10-14 MED ORDER — FENTANYL CITRATE (PF) 100 MCG/2ML IJ SOLN
INTRAMUSCULAR | Status: AC
  Filled 2019-10-14: qty 2

## 2019-10-14 MED ORDER — LIDOCAINE HCL (PF) 1 % IJ SOLN
5.0000 mL | INTRAMUSCULAR | Status: DC | PRN
  Filled 2019-10-14: qty 5

## 2019-10-14 MED ORDER — MUPIROCIN 2 % EX OINT
1.0000 "application " | TOPICAL_OINTMENT | Freq: Two times a day (BID) | CUTANEOUS | Status: DC
  Administered 2019-10-15 – 2019-10-17 (×4): 1 via NASAL
  Filled 2019-10-14: qty 22

## 2019-10-14 MED ORDER — LACOSAMIDE 50 MG PO TABS
100.0000 mg | ORAL_TABLET | Freq: Two times a day (BID) | ORAL | Status: DC
  Administered 2019-10-14 – 2019-10-17 (×6): 100 mg
  Filled 2019-10-14 (×6): qty 2

## 2019-10-14 MED ORDER — PROPOFOL 10 MG/ML IV BOLUS
INTRAVENOUS | Status: DC | PRN
  Administered 2019-10-14: 80 mg via INTRAVENOUS

## 2019-10-14 MED ORDER — ALBUTEROL SULFATE (2.5 MG/3ML) 0.083% IN NEBU: 3.0000 mL | INHALATION_SOLUTION | Freq: Four times a day (QID) | RESPIRATORY_TRACT | Status: DC | PRN

## 2019-10-14 MED ORDER — SODIUM CHLORIDE 0.9 % IV SOLN: 100.0000 mL | INTRAVENOUS | Status: DC | PRN

## 2019-10-14 MED ORDER — GABAPENTIN 300 MG PO CAPS
600.0000 mg | ORAL_CAPSULE | Freq: Three times a day (TID) | ORAL | Status: DC
  Administered 2019-10-15 – 2019-10-17 (×6): 600 mg
  Filled 2019-10-14 (×6): qty 2

## 2019-10-14 MED ORDER — PRO-STAT SUGAR FREE PO LIQD
30.0000 mL | Freq: Two times a day (BID) | ORAL | Status: DC
  Administered 2019-10-14 – 2019-10-17 (×5): 30 mL
  Filled 2019-10-14 (×3): qty 30

## 2019-10-14 MED ORDER — FREE WATER
75.0000 mL | Freq: Four times a day (QID) | Status: DC
  Administered 2019-10-15 – 2019-10-17 (×11): 75 mL

## 2019-10-14 MED ORDER — CHLORHEXIDINE GLUCONATE CLOTH 2 % EX PADS
6.0000 | MEDICATED_PAD | Freq: Every day | CUTANEOUS | Status: DC
  Administered 2019-10-15 – 2019-10-17 (×3): 6 via TOPICAL

## 2019-10-14 MED ORDER — NEPRO/CARBSTEADY PO LIQD
1000.0000 mL | ORAL | Status: DC
  Administered 2019-10-14 – 2019-10-17 (×4): 1000 mL
  Filled 2019-10-14 (×2): qty 1000

## 2019-10-14 MED ORDER — CALCIUM CARBONATE ANTACID 500 MG PO CHEW
1.0000 | CHEWABLE_TABLET | Freq: Every day | ORAL | Status: DC
  Administered 2019-10-16: 200 mg
  Filled 2019-10-14 (×3): qty 1

## 2019-10-14 MED ORDER — ALTEPLASE 2 MG IJ SOLR
2.0000 mg | Freq: Once | INTRAMUSCULAR | Status: DC | PRN
  Filled 2019-10-14: qty 2

## 2019-10-14 MED ORDER — CHLORHEXIDINE GLUCONATE CLOTH 2 % EX PADS
6.0000 | MEDICATED_PAD | Freq: Every day | CUTANEOUS | Status: DC
  Administered 2019-10-14 – 2019-10-15 (×2): 6 via TOPICAL

## 2019-10-14 MED ORDER — RENA-VITE PO TABS
1.0000 | ORAL_TABLET | Freq: Every day | ORAL | Status: DC
  Administered 2019-10-15 – 2019-10-16 (×2): 1
  Filled 2019-10-14 (×4): qty 1

## 2019-10-14 MED ORDER — LIDOCAINE HCL (CARDIAC) PF 100 MG/5ML IV SOSY
PREFILLED_SYRINGE | INTRAVENOUS | Status: DC | PRN
  Administered 2019-10-14: 100 mg via INTRAVENOUS

## 2019-10-14 MED ORDER — SUCCINYLCHOLINE CHLORIDE 20 MG/ML IJ SOLN
INTRAMUSCULAR | Status: DC | PRN
  Administered 2019-10-14: 100 mg via INTRAVENOUS

## 2019-10-14 MED ORDER — B COMPLEX-C PO TABS
1.0000 | ORAL_TABLET | Freq: Every day | ORAL | Status: DC
  Administered 2019-10-15 – 2019-10-16 (×2): 1
  Filled 2019-10-14 (×4): qty 1

## 2019-10-14 MED ORDER — CALCIUM ACETATE (PHOS BINDER) 667 MG/5ML PO SOLN
1334.0000 mg | Freq: Three times a day (TID) | ORAL | Status: DC
  Administered 2019-10-15 – 2019-10-17 (×8): 1334 mg
  Filled 2019-10-14 (×12): qty 10

## 2019-10-14 MED ORDER — PANTOPRAZOLE SODIUM 40 MG PO PACK
40.0000 mg | PACK | Freq: Every day | ORAL | Status: DC
  Administered 2019-10-16 – 2019-10-17 (×2): 40 mg via ORAL
  Filled 2019-10-14 (×5): qty 20

## 2019-10-14 MED ORDER — ORAL CARE MOUTH RINSE
15.0000 mL | Freq: Two times a day (BID) | OROMUCOSAL | Status: DC
  Administered 2019-10-15 – 2019-10-17 (×6): 15 mL via OROMUCOSAL

## 2019-10-14 SURGICAL SUPPLY — 2 items
IMMBOLIZER KNEE 19 BLUE UNIV (SOFTGOODS) ×2 IMPLANT
KIT TURNOVER KIT A (KITS) ×3 IMPLANT

## 2019-10-14 NOTE — Anesthesia Procedure Notes (Signed)
Procedure Name: Intubation Performed by: Lesle Reek, CRNA Pre-anesthesia Checklist: Patient identified, Emergency Drugs available, Suction available, Patient being monitored and Timeout performed Patient Re-evaluated:Patient Re-evaluated prior to induction Oxygen Delivery Method: Circle system utilized Preoxygenation: Pre-oxygenation with 100% oxygen Induction Type: IV induction Grade View: Grade II Tube type: Oral Tube size: 7.0 mm Number of attempts: 1 Airway Equipment and Method: Stylet Placement Confirmation: ETT inserted through vocal cords under direct vision,  positive ETCO2,  breath sounds checked- equal and bilateral and CO2 detector Secured at: 21 cm Tube secured with: Tape

## 2019-10-14 NOTE — Progress Notes (Signed)
Continues restless moving access arm.  Informed patient of the dangers of repeatedly interrupting blood flow.

## 2019-10-14 NOTE — Anesthesia Postprocedure Evaluation (Signed)
Anesthesia Post Note  Patient: Barbara [name]  Procedure(s) Performed: CLOSED REDUCTION HIP (Left Hip)  Patient location during evaluation: PACU Anesthesia Type: General Level of consciousness: awake and alert Pain management: pain level controlled Vital Signs Assessment: post-procedure vital signs reviewed and stable Respiratory status: spontaneous breathing, nonlabored ventilation, respiratory function stable and patient connected to nasal cannula oxygen Cardiovascular status: blood pressure returned to baseline and stable Postop Assessment: no apparent nausea or vomiting Anesthetic complications: no     Last Vitals:  Vitals:   10/14/19 1515 10/14/19 1704  BP: (!) 135/58 (!) 103/91  Pulse: 75 77  Resp: 15 16  Temp: (!) 36.3 C 36.9 C  SpO2: 99% 99%    Last Pain:  Vitals:   10/14/19 1515  TempSrc: Oral  PainSc:                  Precious Haws Fallyn Munnerlyn

## 2019-10-14 NOTE — Progress Notes (Signed)
[place] [facility] [place] NAME: Barbara [name]    MR#:  [id number]  DATE OF BIRTH:  05-17-1941  SUBJECTIVE:   [facility] after she had a mechanical fall and sustained a dislocated arthroplasty of left hip  Patient oriented times one. She knows she is in the hospital and had a fall. No Other issues per RN. He NPO for surgery REVIEW OF SYSTEMS:   Review of Systems  Constitutional: Negative for chills, fever and weight loss.  HENT: Negative for ear discharge, ear pain and nosebleeds.   Eyes: Negative for blurred vision, pain and discharge.  Respiratory: Negative for sputum production, shortness of breath, wheezing and stridor.   Cardiovascular: Negative for chest pain, palpitations, orthopnea and PND.  Gastrointestinal: Negative for abdominal pain, diarrhea, nausea and vomiting.  Genitourinary: Negative for frequency and urgency.  Musculoskeletal: Negative for back pain and joint pain.  Neurological: Negative for sensory change, speech change, focal weakness and weakness.  Psychiatric/Behavioral: Negative for depression and hallucinations. The patient is not nervous/anxious.    Tolerating Diet:npo Tolerating PT: pending sx  DRUG ALLERGIES:   Allergies  Allergen Reactions  . Glipizide Er Other (See Comments)    Hypoglycemia   . Percocet [Oxycodone-Acetaminophen] Hives    VITALS:  Blood pressure (!) 148/66, pulse 75, temperature 97.7 F (36.5 C), temperature source Oral, resp. rate 18, height 5' 4"  (1.626 m), weight 76 kg, SpO2 96 %.  PHYSICAL EXAMINATION:   Physical Exam Limited  GENERAL:  79 y.o.-year-old patient lying in the bed with no acute distress.Obese  HEENT: Head atraumatic, normocephalic. Oropharynx and nasopharynx clear.   LUNGS: Normal breath sounds bilaterally, no wheezing, rales, rhonchi. No use of accessory muscles of respiration.  CARDIOVASCULAR: S1, S2 normal. No murmurs, rubs, or gallops.   ABDOMEN: Soft, nontender, nondistended. Bowel sounds present. No organomegaly or mass. PEG tube+ EXTREMITIES: No cyanosis, clubbing or edema b/l.    NEUROLOGIC: focal. Limited exam due to patient participation. No focal weakness PSYCHIATRIC:  patient is alert and awake SKIN:  Pressure Injury 09/21/19 Buttocks Right Stage 2 -  Partial thickness loss of dermis presenting as a shallow open injury with a red, pink wound bed without slough. 0.5cmx0.5cm (Active)  09/21/19 0830  Location: Buttocks  Location Orientation: Right  Staging: Stage 2 -  Partial thickness loss of dermis presenting as a shallow open injury with a red, pink wound bed without slough.  Wound Description (Comments): 0.5cmx0.5cm  Present on Admission: No     Pressure Injury 10/13/19 Buttocks Left Stage 2 -  Partial thickness loss of dermis presenting as a shallow open injury with a red, pink wound bed without slough. (Active)  10/13/19 2352  Location: Buttocks  Location Orientation: Left  Staging: Stage 2 -  Partial thickness loss of dermis presenting as a shallow open injury with a red, pink wound bed without slough.  Wound Description (Comments):   Present on Admission: Yes       LABORATORY PANEL:  CBC Recent Labs  Lab 10/13/19 2040  WBC 11.0*  HGB 9.0*  HCT 28.5*  PLT 241    Chemistries  Recent Labs  Lab 10/13/19 2040  NA 139  K 4.9  CL 94*  CO2 25  GLUCOSE 116*  BUN 145*  CREATININE 12.22*  CALCIUM 9.4  AST 21  ALT 10  ALKPHOS 86  BILITOT 0.6   Cardiac Enzymes No results for input(s): TROPONINI in the last 168 hours.  RADIOLOGY:  DG Chest 1 View  Result Date: 10/13/2019 CLINICAL DATA:  79 year old female with left hip and leg pain after a fall yesterday. EXAM: CHEST  1 VIEW COMPARISON:  Chest CTA 10/03/2019 and earlier. FINDINGS: Supine AP view at 1938 hours. The patient is more rotated to the right. Chronic left innominate vein and axillary vascular stents. Stable mild cardiomegaly and  mediastinal contours. Allowing for portable technique the lungs are clear. Visualized tracheal air column is within normal limits. No acute osseous abnormality identified. IMPRESSION: No acute cardiopulmonary abnormality. Electronically Signed   By: Genevie Ann M.D.   On: 10/13/2019 20:13   DG HIP UNILAT WITH PELVIS 2-3 VIEWS LEFT  Result Date: 10/13/2019 CLINICAL DATA:  79 year old female with left hip and leg pain after a fall yesterday. EXAM: DG HIP (WITH OR WITHOUT PELVIS) 2-3V LEFT COMPARISON:  Postoperative left hip radiographs 09/05/2019. 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 The femoral head component appears mildly impacted on the acetabular component, but no bone or hardware fracture is identified. Pubic rami appear stable. No pelvis fracture identified. Grossly intact proximal right femur. Pelvic and proximal lower extremity vascular calcifications. Negative visible bowel gas pattern. IMPRESSION: Superiorly dislocated left hip arthroplasty. No fracture identified. Electronically Signed   By: Genevie Ann M.D.   On: 10/13/2019 20:11   ASSESSMENT AND PLAN:  Barbara [name] is a 79 y.o. female with medical history significant of diabetes, coronary artery disease, end-stage renal disease on hemodialysis on Tuesdays Thursdays and Saturdays, chronic bronchitis, GERD, hypertension, O2 dependence, osteoarthritis who had left total hip replacement on September 05, 2019.  She was in a skilled facility where she had a fall.  Patient sustained a periprosthetic dislocation.   #1 left periprosthetic dislocation:  -Pain management.  Keep n.p.o.  No anticoagulation overnight.   -scheduled for left hip reduction in the OR today per orthopedic Dr. Posey Pronto  #2 coronary artery disease: Stable.    #3 essential hypertension:  -meds after surgery  #4 end-stage renal disease:  patient is on dialysis for Tuesday Thursday Saturday -potassium is 4.9 -pending BMP be this  morning  #5 severe protein calorie malnutrition: Nutrition consult -patient will resume peg tube feeding after surgery  #6 diabetes type II uncontrolled, peripheral neuropathy, CAD, CKD/ESRD:  -Sliding scale insulin till patient is started back on diet  #7 h/o Seizure Do with most recent workup for encephalopathy/cognitive decline essentially negative -recent MRI no acute abnormality -recent EEG-- shows diffuse slowing consistent with encephalopathy -continue the vimpat   DVT prophylaxis: Subcu heparin after surgery Code Status: DNR prior to admission Family Communication: none today Disposition Plan: Back to skilled facility[facility], TOC consulted Consults called: Dr. Posey Pronto, orthopedics   TOTAL TIME TAKING CARE OF THIS PATIENT: *30* minutes.  >50% time spent on counselling and coordination of care  Note: This dictation was prepared with Dragon dictation along with smaller phrase technology. Any transcriptional errors that result from this process are unintentional.  Fritzi Mandes M.D    [facility]   CC: Primary care physician; [name], MDPatient ID: Dyke Maes, female   DOB: Nov 12, 1940, 79 y.o.   MRN: [id number]

## 2019-10-14 NOTE — Progress Notes (Signed)
This note also relates to the following rows which could not be included: Pulse Rate - Cannot attach notes to unvalidated device data Resp - Cannot attach notes to unvalidated device data BP - Cannot attach notes to unvalidated device data SpO2 - Cannot attach notes to unvalidated device data  Hd started

## 2019-10-14 NOTE — H&P (Signed)
Paper H&P to be scanned into permanent record. H&P reviewed. No significant changes noted.  

## 2019-10-14 NOTE — Progress Notes (Signed)
15 minute call to floor.

## 2019-10-14 NOTE — Anesthesia Preprocedure Evaluation (Signed)
Anesthesia Evaluation  Patient identified by MRN, date of birth, ID band Patient awake and Patient confused    Reviewed: Allergy & Precautions, NPO status , Patient's Chart, lab work & pertinent test results  History of Anesthesia Complications Negative for: history of anesthetic complications  Airway Mallampati: IV  TM Distance: >3 FB Neck ROM: limited    Dental no notable dental hx. (+) Edentulous Upper, Poor Dentition, Chipped   Pulmonary shortness of breath, asthma , sleep apnea, Continuous Positive Airway Pressure Ventilation and Oxygen sleep apnea , pneumonia, COPD,  oxygen dependent, Patient abstained from smoking.Not current smoker, former smoker,  2L O2 Bedias at night   Pulmonary exam normal breath sounds clear to auscultation       Cardiovascular Exercise Tolerance: Poor METShypertension, Pt. on medications + CAD and +CHF  (-) Past MI (-) dysrhythmias  Rhythm:Regular Rate:Normal - Systolic murmurs Patient unclear about cardiac history. Denies stents/MI, says maybe she has CHF. TTE 2021 unremarkable other than grade 1 diastolic dysfunction   Neuro/Psych Seizures -,   Neuromuscular disease negative psych ROS   GI/Hepatic GERD  ,(+)     (-) substance abuse  ,   Endo/Other  diabetes, Type 2  Renal/GU ESRF and DialysisRenal diseasenegative Renal ROSLast dialyzed yesterday     Musculoskeletal  (+) Arthritis ,   Abdominal   Peds  Hematology   Anesthesia Other Findings Past Medical History: No date: Adult celiac disease 2017: Anemia associated with chronic renal failure     Comment:  blood transfusion last week 10/17 No date: Arthritis No date: Asthma 2017: Cancer ([place])     Comment:  bladder No date: Chronic kidney disease No date: CKD (chronic kidney disease)     Comment:  stage IV kidney disease.  dr. Candiss Norse and dr. Holley Raring               follow her No date: COPD (chronic obstructive pulmonary disease)  ([place]) No date: Coronary artery disease No date: Diabetes mellitus without complication ([place]) No date: Dialysis patient [facility])     Comment:  Tues, Thurs, Sat No date: Dyspnea     Comment:  with exertion No date: Elevated lipids No date: GERD (gastroesophageal reflux disease) No date: Hematuria No date: Hypertension No date: Lower back pain No date: Neuropathy No date: Oxygen dependent     Comment:  at [facility] 2017: Personal history of chemotherapy     Comment:  bladder ca 2017: Personal history of radiation therapy     Comment:  bladder ca No date: Sleep apnea     Comment:  uses cpap 01/2016: Urinary obstruction  Reproductive/Obstetrics                             Anesthesia Physical  Anesthesia Plan  ASA: IV  Anesthesia Plan: General   Post-op Pain Management:    Induction: Intravenous  PONV Risk Score and Plan: 3 and Ondansetron, Dexamethasone and Propofol infusion  Airway Management Planned: Oral ETT  Additional Equipment:   Intra-op Plan:   Post-operative Plan: Possible Post-op intubation/ventilation  Informed Consent: I have reviewed the patients History and Physical, chart, labs and discussed the procedure including the risks, benefits and alternatives for the proposed anesthesia with the patient or authorized representative who has indicated his/her understanding and acceptance.   Patient has DNR.  Suspend DNR and Discussed DNR with power of attorney.   Dental Advisory Given  Plan Discussed with: CRNA and Surgeon  Anesthesia Plan Comments: (History and phone consent from the patients husband Aubrii Sharpless at [phone]    Husband consented for risks of anesthesia including but not limited to:  - adverse reactions to medications - damage to teeth, lips or other oral mucosa - sore throat or hoarseness - Damage to heart, brain, lungs or loss of life  He voiced understanding.)        Anesthesia Quick Evaluation

## 2019-10-14 NOTE — Consult Note (Addendum)
Barbara [name] MRN: [id number] DOB/AGE: 80/23/1942 80 y.o. Primary Care Physician:Johnston, Jenny Reichmann, MD Admit date: 10/13/2019 Chief Complaint:  Chief Complaint  Patient presents with  . Fall  . Hip Pain   HPI: Barbara [name] is a 79 y.o. female with medical history significant of diabetes, coronary artery disease, end-stage renal disease on hemodialysis on Tuesdays Thursdays and Saturdays, chronic bronchitis, GERD, hypertension, O2 dependence, osteoarthritis who was brought to the ER with chief complaint of fall.  History of present illness date back to February 16 when patient had a left total hip replacement.  Patient was later discharged to skilled nursing facility where she had a fall.    Upon evaluation in the ER it was found that patient sustained a periprosthetic dislocation.  In the ER attempted reduction failed as patient is too risky to sedate.  Nephrology was consulted for her dialysis needs. No complaint of fever cough or chills No complaint of nausea vomiting diarrhea No complaint of hematuria No complaint of change in speech or change in vision.. Patient main concern continues to be my leg is hurting   Past Medical History:  Diagnosis Date  . Adult celiac disease   . Anemia associated with chronic renal failure 2017   blood transfusion last week 10/17  . Arthritis   . Asthma   . 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 Chronic kidney disease   . 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  dr. Candiss Norse and dr. Holley Raring follow her  . COPD (chronic obstructive pulmonary disease) ([place])   . Coronary artery disease   . Diabetes mellitus without complication ([place])   . Dialysis patient ([place])    Tues, Thurs, Sat  . Dyspnea    with exertion  . Elevated lipids   . GERD (gastroesophageal reflux disease)   . Hematuria   . Hypertension   . Lower back pain   . Neuropathy   . Oxygen dependent    at hs  . Personal history of chemotherapy 2017   bladder ca  . Personal history of  radiation therapy 2017   bladder ca  . Sleep apnea    uses cpap  . Urinary obstruction 01/2016        Family History  Problem Relation Age of Onset  . Diabetes Mother   . Cancer Father   . Breast cancer Neg Hx   . Kidney disease Neg Hx     Social History:  reports that she quit smoking about 17 years ago. Her smoking use included cigarettes. She has never used smokeless tobacco. She reports that she does not drink alcohol or use drugs.   Allergies:  Allergies  Allergen Reactions  . Glipizide Er Other (See Comments)    Hypoglycemia   . Percocet [Oxycodone-Acetaminophen] Hives    Medications Prior to Admission  Medication Sig Dispense Refill  . albuterol (PROVENTIL HFA;VENTOLIN HFA) 108 (90 Base) MCG/ACT inhaler Inhale 2 puffs into the lungs every 6 (six) hours as needed for wheezing or shortness of breath.     Marland Kitchen amLODipine (NORVASC) 5 MG tablet Place 1 tablet (5 mg total) into feeding tube daily.    Marland Kitchen atorvastatin (LIPITOR) 40 MG tablet Place 40 mg into feeding tube at bedtime.     . B Complex-C (B-COMPLEX WITH VITAMIN C) tablet Place 1 tablet into feeding tube at bedtime.    . Calcium Acetate 667 MG TABS Place 1,334 mg into feeding tube 3 (three) times daily.    Marland Kitchen  calcium carbonate (TUMS - DOSED IN MG ELEMENTAL CALCIUM) 500 MG chewable tablet Place 1 tablet into feeding tube at bedtime.     . cholecalciferol (VITAMIN D) 1000 units tablet Take 1 tablet (1,000 Units total) by mouth daily.    Marland Kitchen esomeprazole (NEXIUM) 40 MG packet 40 mg daily before breakfast. Per tube    . ferrous sulfate 300 (60 Fe) MG/5ML syrup Place 324 mg into feeding tube at bedtime.    . gabapentin (NEURONTIN) 300 MG capsule Place 2 capsules (600 mg total) into feeding tube 3 (three) times daily.    . heparin 5000 UNIT/ML injection Inject 1 mL (5,000 Units total) into the skin every 12 (twelve) hours. 1 mL 0  . lacosamide 100 MG TABS Place 1 tablet (100 mg total) into feeding tube 2 (two) times daily. 60  tablet   . metoCLOPramide (REGLAN) 5 MG/5ML solution Place 5 mg into feeding tube every 8 (eight) hours.    . multivitamin (RENA-VIT) TABS tablet Place 1 tablet into feeding tube at bedtime.  0  . Amino Acids-Protein Hydrolys (FEEDING SUPPLEMENT, PRO-STAT SUGAR FREE 64,) LIQD Place 30 mLs into feeding tube 2 (two) times daily. 887 mL 0  . Blood Glucose Monitoring Suppl (ACCU-CHEK AVIVA PLUS) w/Device KIT (USE TO MONITOR BLOOD SUGARS.)  0  . epoetin alfa (EPOGEN) 10000 UNIT/ML injection Inject 1 mL (10,000 Units total) into the vein every Monday, Wednesday, and Friday with hemodialysis. (Patient not taking: Reported on 10/14/2019) 1 mL 0  . NON FORMULARY Place 1 Units into the nose at bedtime. CPAP Time of use 2100-0600    . OXYGEN Inhale 2 L into the lungs at bedtime.         TIR:WERXV from the symptoms mentioned above,there are no other symptoms referable to all systems reviewed.  Marland Kitchen amLODipine  5 mg Per Tube Daily  . atorvastatin  40 mg Per Tube QHS  . B-complex with vitamin C  1 tablet Per Tube QHS  . calcium acetate (Phos Binder)  1,334 mg Per Tube TID with meals  . calcium carbonate  1 tablet Per Tube QHS  . Chlorhexidine Gluconate Cloth  6 each Topical Q0600  . cholecalciferol  1,000 Units Oral Daily  . ferrous sulfate  324 mg Per Tube QHS  . gabapentin  600 mg Per Tube TID  . heparin  5,000 Units Subcutaneous Q8H  . insulin aspart  0-5 Units Subcutaneous QHS  . insulin aspart  0-6 Units Subcutaneous TID WC  . lacosamide  100 mg Per Tube BID  . mouth rinse  15 mL Mouth Rinse BID  . multivitamin  1 tablet Per Tube QHS  . mupirocin ointment  1 application Nasal BID  . [START ON 10/15/2019] pantoprazole sodium  40 mg Oral QAC breakfast      Physical Exam: Vital signs in last 24 hours: Temp:  [97.7 F (36.5 C)-98.2 F (36.8 C)] 97.7 F (36.5 C) (03/27 0816) Pulse Rate:  [75-79] 75 (03/27 0816) Resp:  [18-22] 18 (03/27 0816) BP: (133-154)/(53-80) 148/66 (03/27 0816) SpO2:   [93 %-96 %] 96 % (03/27 0816) Weight:  [76 kg] 76 kg (03/26 1913) Weight change:     Intake/Output from previous day: 03/26 0701 - 03/27 0700 In: -  Out: 1 [Urine:1] No intake/output data recorded.   Physical Exam: General- pt is awake,alert, follows commands  resp- No acute REsp distress,  NO Rhonchi CVS- S1S2 regular in rate and rhythm GIT- BS+, soft, NT, ND, PEG tube in  situ EXT- NO LE Edema, Cyanosis Right leg is externally rotated CNS- CN 2-12 grossly intact. Moving all 4 extremities Psych- normal mod and affect Access-left arm AVG   Lab Results: CBC Recent Labs    10/13/19 2040 10/14/19 0938  WBC 11.0* 9.3  HGB 9.0* 9.2*  HCT 28.5* 29.0*  PLT 241 234    BMET Recent Labs    10/13/19 2040  NA 139  K 4.9  CL 94*  CO2 25  GLUCOSE 116*  BUN 145*  CREATININE 12.22*  CALCIUM 9.4    MICRO Recent Results (from the past 240 hour(s))  SARS CORONAVIRUS 2 (TAT 6-24 HRS) Nasopharyngeal Nasopharyngeal Swab     Status: None   Collection Time: 10/05/19  3:11 PM   Specimen: Nasopharyngeal Swab  Result Value Ref Range Status   SARS Coronavirus 2 NEGATIVE NEGATIVE Corrected    Comment: RESULT CALLED TO, READ BACK BY AND VERIFIED WITH: THIS IS A CORRECTED REPORT. PREVIOUSLY CALLED REPORT WAS POSITIVE. CALLED TO [facility] RN 10/06/19 1755 JDW Performed at [facility] [address], [place], [place] [address] CORRECTED ON 03/19 AT 5631: PREVIOUSLY REPORTED AS POSITIVE RESULT CALLED TO, READ BACK BY AND VERIFIED WITH: RN  TINA MICHAEL AT [place] ON 10/06/2019   Respiratory Panel by RT PCR (Flu A&B, Covid) - Nasopharyngeal Swab     Status: None   Collection Time: 10/10/19  1:20 PM   Specimen: Nasopharyngeal Swab  Result Value Ref Range Status   SARS Coronavirus 2 by RT PCR NEGATIVE NEGATIVE Final    Comment: (NOTE) SARS-CoV-2 target nucleic acids are NOT DETECTED. The SARS-CoV-2 RNA is generally detectable in upper respiratoy specimens  during the acute phase of infection. The lowest concentration of SARS-CoV-2 viral copies this assay can detect is 131 copies/mL. A negative result does not preclude SARS-Cov-2 infection and should not be used as the sole basis for treatment or other patient management decisions. A negative result may occur with  improper specimen collection/handling, submission of specimen other than nasopharyngeal swab, presence of viral mutation(s) within the areas targeted by this assay, and inadequate number of viral copies (<131 copies/mL). A negative result must be combined with clinical observations, patient history, and epidemiological information. The expected result is Negative. Fact Sheet for Patients:  PinkCheek.be Fact Sheet for Healthcare Providers:  GravelBags.it This test is not yet ap proved or cleared by the [place] FDA and  has been authorized for detection and/or diagnosis of SARS-CoV-2 by FDA under an Emergency Use Authorization (EUA). This EUA will remain  in effect (meaning this test can be used) for the duration of the COVID-19 declaration under Section 564(b)(1) of the Act, 21 U.S.C. section 360bbb-3(b)(1), unless the authorization is terminated or revoked sooner.    Influenza A by PCR NEGATIVE NEGATIVE Final   Influenza B by PCR NEGATIVE NEGATIVE Final    Comment: (NOTE) The Xpert Xpress SARS-CoV-2/FLU/RSV assay is intended as an aid in  the diagnosis of influenza from Nasopharyngeal swab specimens and  should not be used as a sole basis for treatment. Nasal washings and  aspirates are unacceptable for Xpert Xpress SARS-CoV-2/FLU/RSV  testing. Fact Sheet for Patients: PinkCheek.be Fact Sheet for Healthcare Providers: GravelBags.it This test is not yet approved or cleared by the [place] FDA and  has been authorized for detection and/or diagnosis of  SARS-CoV-2 by  FDA under an Emergency Use Authorization (EUA). This EUA will remain  in effect (meaning this test can  be used) for the duration of the  Covid-19 declaration under Section 564(b)(1) of the Act, 21  U.S.C. section 360bbb-3(b)(1), unless the authorization is  terminated or revoked. Performed at [facility], [address] [place], [place] [address]   Respiratory Panel by RT PCR (Flu A&B, Covid) - Nasopharyngeal Swab     Status: None   Collection Time: 10/13/19 10:09 PM   Specimen: Nasopharyngeal Swab  Result Value Ref Range Status   SARS Coronavirus 2 by RT PCR NEGATIVE NEGATIVE Final    Comment: (NOTE) SARS-CoV-2 target nucleic acids are NOT DETECTED. The SARS-CoV-2 RNA is generally detectable in upper respiratoy specimens during the acute phase of infection. The lowest concentration of SARS-CoV-2 viral copies this assay can detect is 131 copies/mL. A negative result does not preclude SARS-Cov-2 infection and should not be used as the sole basis for treatment or other patient management decisions. A negative result may occur with  improper specimen collection/handling, submission of specimen other than nasopharyngeal swab, presence of viral mutation(s) within the areas targeted by this assay, and inadequate number of viral copies (<131 copies/mL). A negative result must be combined with clinical observations, patient history, and epidemiological information. The expected result is Negative. Fact Sheet for Patients:  PinkCheek.be Fact Sheet for Healthcare Providers:  GravelBags.it This test is not yet ap proved or cleared by the [place] FDA and  has been authorized for detection and/or diagnosis of SARS-CoV-2 by FDA under an Emergency Use Authorization (EUA). This EUA will remain  in effect (meaning this test can be used) for the duration of the COVID-19 declaration under Section 564(b)(1) of  the Act, 21 U.S.C. section 360bbb-3(b)(1), unless the authorization is terminated or revoked sooner.    Influenza A by PCR NEGATIVE NEGATIVE Final   Influenza B by PCR NEGATIVE NEGATIVE Final    Comment: (NOTE) The Xpert Xpress SARS-CoV-2/FLU/RSV assay is intended as an aid in  the diagnosis of influenza from Nasopharyngeal swab specimens and  should not be used as a sole basis for treatment. Nasal washings and  aspirates are unacceptable for Xpert Xpress SARS-CoV-2/FLU/RSV  testing. Fact Sheet for Patients: PinkCheek.be Fact Sheet for Healthcare Providers: GravelBags.it This test is not yet approved or cleared by the [place] FDA and  has been authorized for detection and/or diagnosis of SARS-CoV-2 by  FDA under an Emergency Use Authorization (EUA). This EUA will remain  in effect (meaning this test can be used) for the duration of the  Covid-19 declaration under Section 564(b)(1) of the Act, 21  U.S.C. section 360bbb-3(b)(1), unless the authorization is  terminated or revoked. Performed at [facility], [address], [place], [place] [address]   Surgical pcr screen     Status: Abnormal   Collection Time: 10/13/19 11:25 PM   Specimen: Nasal Mucosa; Nasal Swab  Result Value Ref Range Status   MRSA, PCR POSITIVE (A) NEGATIVE Final    Comment: RESULT CALLED TO, READ BACK BY AND VERIFIED WITH: TERESA COBLE ON 10/14/19 AT 0147 [facility]    Staphylococcus aureus POSITIVE (A) NEGATIVE Final    Comment: (NOTE) The Xpert SA Assay (FDA approved for NASAL specimens in patients 22 years of age and older), is one component of a comprehensive surveillance program. It is not intended to diagnose infection nor to guide or monitor treatment. Performed at [facility], [address], [place], [place] [address]       Lab Results  Component Value Date   PTH 310 (H)  09/06/2019   CALCIUM 9.4 10/13/2019   CAION  0.85 (LL) 09/05/2019   PHOS 4.7 (H) 10/09/2019      Impression:  Patient is a 79 year old African-American female with a past medical history of ESRD on hemodialysis, hypertension, diabetes mellitus type 2, diabetic neuropathy, seizure disorder, anemia of chronic disease, secondary hyperparathyroidism, history of acute GI bleeding, history of left hip replacement who was admitted with chief complaint of fall and found to have periprosthetic dislocation  1)Renal ESRD Patient is on hemodialysis Patient is on Monday Wednesday Friday schedule as an outpatient  patient first day of dialysis was Nov 20, 2016. Patient missed her dialysis yesterday as patient was here with a fall Patient will be dialyzed today.   2)HTN Patient blood pressure is well controlled Patient is on calcium channel blockers   3)Anemia of chronic disease  HGb at goal (9--11) We will keep patient on Epogen during dialysis  4) secondary hyperparathyroidism-CKD Mineral-Bone Disorder Secondary Hyperparathyroidism present Phosphorus at goal. Patient is on binders  5) left hip dislocation Patient is to undergo left hip reduction in the OR  6) electrolytes Sodium within normal range Potassium within normal range  7)Acid base Co2 at goal     Plan:  Will dialyze patient today Will use 2K bath We will keep patient on Epogen We will not use any heparin as patient is undergoing procedure  Addendum Patient seen on dialysis Patient tolerating treatment well  Kendrick Remigio s [facility] 10/14/2019, 10:07 AM

## 2019-10-14 NOTE — Progress Notes (Addendum)
Initial Nutrition Assessment  RD working remotely.  DOCUMENTATION CODES:   Not applicable  INTERVENTION:   -Continue b-complex with C via tube  -Initiate Nepro @ 40 ml/hr via PEG  30 ml Prostat BID.    75 ml free water flush every 6 hours  Tube feeding regimen provides 1928 kcal (100% of needs), 108 grams of protein, and 698 ml of H2O. Total free water: 998 ml daily  NUTRITION DIAGNOSIS:   Inadequate oral intake related to dysphagia, inability to eat as evidenced by NPO status(PEG dependent).  GOAL:   Patient will meet greater than or equal to 90% of their needs  MONITOR:   Weight trends, Labs, TF tolerance, Skin, I & O's  REASON FOR ASSESSMENT:   Consult Enteral/tube feeding initiation and management  ASSESSMENT:   Barbara [name] is a 79 y.o. female with medical history significant of diabetes, coronary artery disease, end-stage renal disease on hemodialysis on Tuesdays Thursdays and Saturdays, chronic bronchitis, GERD, hypertension, O2 dependence, osteoarthritis who had left total hip replacement on September 05, 2019.  She was in a skilled facility where she had a fall.  Patient sustained a periprosthetic dislocation.  She was seen in the ER and attempted reduction failed as patient is too risky to sedate.  Orthopedics consulted and Dr. Posey Pronto plans to do reduction in the OR in the morning.  She is being admitted to the hospital under medical service pending reduction tomorrow.  She is not able to give adequate history.  She is weak dehydrated and debilitated.  Appears to be in pain..  Pt admitted with lt hip dislocation s/p mechanical fall.  Reviewed I/O's: - 1 ml x 24 hours  Attempted to speak with pt via phone, however, no answer. Per chart review, pt unable to provide adequate history. She is a resident of [facility].   Per MD notes, plan for ORIF with orthopedics service today. Plan HD later on today per nephrology.  RD received consult to resume TF  post-operatively.   Medications reviewed and include b-complex with vitamin C, calcium carbonate, and calcium acetate.   Lab Results  Component Value Date   HGBA1C 5.7 (H) 10/03/2019   PTA DM medications are none.   Labs reviewed: CBGS: 101 (inpatient orders for glycemic control are 0-5 units insulin aspart q HS and 0-6 units insulin aspart TID with meals).   Diet Order:   Diet Order            Diet NPO time specified  Diet effective midnight              EDUCATION NEEDS:   No education needs have been identified at this time  Skin:  Skin Assessment: Skin Integrity Issues: Skin Integrity Issues:: Stage II, Incisions Stage II: bilateral buttocks Incisions: closed lt hip  Last BM:  Unknown  Height:   Ht Readings from Last 1 Encounters:  10/13/19 5' 4"  (1.626 m)    Weight:   Wt Readings from Last 1 Encounters:  10/13/19 76 kg    Ideal Body Weight:  54.5 kg  BMI:  Body mass index is 28.76 kg/m.  Estimated Nutritional Needs:   Kcal:  1700-1900  Protein:  100-115 grams  Fluid:  1000 ML + UOP    Loistine Chance, RD, LDN, CDCES Registered Dietitian II Certified Diabetes Care and Education Specialist Please refer to [facility] for RD and/or RD on-call/weekend/after hours pager

## 2019-10-14 NOTE — Progress Notes (Signed)
Patient restless, decreased BFR for patient safety.

## 2019-10-14 NOTE — Progress Notes (Signed)
This note also relates to the following rows which could not be included: Resp - Cannot attach notes to unvalidated device data  Discontinued treatment dt restlessness and for patient safety, unable to keep access arm in place.

## 2019-10-14 NOTE — Consult Note (Addendum)
ORTHOPAEDIC CONSULTATION  REQUESTING PHYSICIAN: Fritzi Mandes, MD  Chief Complaint:   L hip pain  History of Present Illness: Barbara [name] is a 79 y.o. female who underwent L THA by Dr. Roland Rack on 09/05/19. She has been at a SNF. She was attempting to transfer from wheelchair to bed on 10/12/19 in the evening and had a fall. She had significantly increased L hip pain and decreased RoM since that time. Pain is rated a 10 out of 10 in severity with attempted motion.  Pain is improved with rest and immobilization.  X-rays in the emergency department show a left hip periprosthetic dislocation. Patient was deemed too high risk of sedation in ED to perform closed reduction. Therefore, she was admitted for attempted closed reduction in OR today.   Of note, she has significant medical comorbidities including COPD, ESRD on dialysis, CAD, and DM.   Past Medical History:  Diagnosis Date  . Adult celiac disease   . Anemia associated with chronic renal failure 2017   blood transfusion last week 10/17  . Arthritis   . Asthma   . 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 Chronic kidney disease   . 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  dr. Candiss Norse and dr. Holley Raring follow her  . COPD (chronic obstructive pulmonary disease) ([place])   . Coronary artery disease   . Diabetes mellitus without complication ([place])   . Dialysis patient ([place])    Tues, Thurs, Sat  . Dyspnea    with exertion  . Elevated lipids   . GERD (gastroesophageal reflux disease)   . Hematuria   . Hypertension   . Lower back pain   . Neuropathy   . Oxygen dependent    at hs  . Personal history of chemotherapy 2017   bladder ca  . Personal history of radiation therapy 2017   bladder ca  . Sleep apnea    uses cpap  . Urinary obstruction 01/2016   Past Surgical History:  Procedure Laterality Date  . A/V SHUNT INTERVENTION N/A 05/12/2018   Procedure: A/V SHUNT  INTERVENTION;  Surgeon: Algernon Huxley, MD;  Location: [place] [facility];  Service: Cardiovascular;  Laterality: N/A;  . A/V SHUNT INTERVENTION Left 07/29/2018   Procedure: A/V SHUNT INTERVENTION;  Surgeon: Katha Cabal, MD;  Location: [place] [facility];  Service: Cardiovascular;  Laterality: Left;  . ABDOMINAL HYSTERECTOMY    . AV FISTULA PLACEMENT Left 02/17/2017   Procedure: INSERTION OF ARTERIOVENOUS (AV) GORE-TEX GRAFT ARM(BRACHIAL AXILLARY);  Surgeon: Katha Cabal, MD;  Location: [facility];  Service: Vascular;  Laterality: Left;  . CYSTOSCOPY W/ RETROGRADES Bilateral 02/17/2016   Procedure: CYSTOSCOPY WITH RETROGRADE PYELOGRAM;  Surgeon: Hollice Espy, MD;  Location: [facility];  Service: Urology;  Laterality: Bilateral;  . CYSTOSCOPY W/ RETROGRADES Bilateral 10/12/2016   Procedure: CYSTOSCOPY WITH RETROGRADE PYELOGRAM;  Surgeon: Hollice Espy, MD;  Location: [facility];  Service: Urology;  Laterality: Bilateral;  . CYSTOSCOPY W/ RETROGRADES Bilateral 06/09/2017   Procedure: CYSTOSCOPY WITH RETROGRADE PYELOGRAM;  Surgeon: Hollice Espy, MD;  Location: [facility];  Service: Urology;  Laterality: Bilateral;  . CYSTOSCOPY W/ RETROGRADES Bilateral 10/07/2017   Procedure: CYSTOSCOPY WITH RETROGRADE PYELOGRAM;  Surgeon: Hollice Espy, MD;  Location: [facility];  Service: Urology;  Laterality: Bilateral;  . CYSTOSCOPY W/ URETERAL STENT PLACEMENT Left 05/12/2016   Procedure: CYSTOSCOPY WITH STENT REPLACEMENT;  Surgeon: Hollice Espy, MD;  Location: [facility];  Service: Urology;  Laterality: Left;  .  CYSTOSCOPY W/ URETERAL STENT PLACEMENT Left 10/12/2016   Procedure: CYSTOSCOPY WITH STENT REPLACEMENT;  Surgeon: Hollice Espy, MD;  Location: [facility];  Service: Urology;  Laterality: Left;  . CYSTOSCOPY WITH BIOPSY N/A 06/09/2017   Procedure: CYSTOSCOPY WITH BLADDER BIOPSY;  Surgeon: Hollice Espy, MD;  Location: [facility];  Service: Urology;  Laterality: N/A;  . CYSTOSCOPY WITH STENT  PLACEMENT Left 01/21/2016   Procedure: CYSTOSCOPY WITH double J STENT PLACEMENT;  Surgeon: Franchot Gallo, MD;  Location: [facility];  Service: Urology;  Laterality: Left;  . CYSTOSCOPY WITH STENT PLACEMENT Right 10/12/2016   Procedure: CYSTOSCOPY WITH STENT PLACEMENT;  Surgeon: Hollice Espy, MD;  Location: [facility];  Service: Urology;  Laterality: Right;  . DIALYSIS/PERMA CATHETER INSERTION N/A 11/20/2016   Procedure: Dialysis/Perma Catheter Insertion;  Surgeon: Algernon Huxley, MD;  Location: [place] [facility];  Service: Cardiovascular;  Laterality: N/A;  . DIALYSIS/PERMA CATHETER REMOVAL N/A 03/30/2017   Procedure: DIALYSIS/PERMA CATHETER REMOVAL;  Surgeon: Katha Cabal, MD;  Location: [place] [facility];  Service: Cardiovascular;  Laterality: N/A;  . ESOPHAGOGASTRODUODENOSCOPY (EGD) WITH PROPOFOL N/A 09/28/2019   Procedure: ESOPHAGOGASTRODUODENOSCOPY (EGD) WITH PROPOFOL;  Surgeon: Toledo, Benay Pike, MD;  Location: [facility];  Service: Gastroenterology;  Laterality: N/A;  . KIDNEY SURGERY  01/21/2016   IR NEPHROSTOMY PLACEMENT LEFT   . PEG PLACEMENT N/A 09/20/2019   Procedure: PERCUTANEOUS ENDOSCOPIC GASTROSTOMY (PEG) PLACEMENT;  Surgeon: Toledo, Benay Pike, MD;  Location: [facility];  Service: Gastroenterology;  Laterality: N/A;  . PERIPHERAL VASCULAR CATHETERIZATION N/A 04/07/2016   Procedure: Glori Luis Cath Insertion;  Surgeon: Katha Cabal, MD;  Location: [place] [facility];  Service: Cardiovascular;  Laterality: N/A;  . PERIPHERAL VASCULAR THROMBECTOMY Left 06/02/2017   Procedure: PERIPHERAL VASCULAR THROMBECTOMY;  Surgeon: Algernon Huxley, MD;  Location: [place] [facility];  Service: Cardiovascular;  Laterality: Left;  . PERIPHERAL VASCULAR THROMBECTOMY Left 01/28/2018   Procedure: PERIPHERAL VASCULAR THROMBECTOMY;  Surgeon: Katha Cabal, MD;  Location: [place] [facility];  Service: Cardiovascular;  Laterality: Left;  . PERIPHERAL VASCULAR THROMBECTOMY Left 03/14/2018    Procedure: PERIPHERAL VASCULAR THROMBECTOMY;  Surgeon: Algernon Huxley, MD;  Location: [place] [facility];  Service: Cardiovascular;  Laterality: Left;  . PERIPHERAL VASCULAR THROMBECTOMY Left 03/16/2018   Procedure: PERIPHERAL VASCULAR THROMBECTOMY;  Surgeon: Katha Cabal, MD;  Location: [place] [facility];  Service: Cardiovascular;  Laterality: Left;  . PORTA CATH REMOVAL N/A 02/07/2019   Procedure: PORTA CATH REMOVAL;  Surgeon: Katha Cabal, MD;  Location: [place] [facility];  Service: Cardiovascular;  Laterality: N/A;  . PORTACATH PLACEMENT Right   . ROTATOR CUFF REPAIR Left   . TEMPORARY DIALYSIS CATHETER N/A 05/11/2018   Procedure: TEMPORARY DIALYSIS CATHETER;  Surgeon: Katha Cabal, MD;  Location: [place] [facility];  Service: Cardiovascular;  Laterality: N/A;  . TEMPORARY DIALYSIS CATHETER Left 07/27/2018   Procedure: TEMPORARY DIALYSIS CATHETER;  Surgeon: Katha Cabal, MD;  Location: [place] [facility];  Service: Cardiovascular;  Laterality: Left;  . TOTAL HIP ARTHROPLASTY Left 09/05/2019   Procedure: TOTAL HIP ARTHROPLASTY;  Surgeon: Corky Mull, MD;  Location: [facility];  Service: Orthopedics;  Laterality: Left;  . TRANSURETHRAL RESECTION OF BLADDER TUMOR N/A 02/17/2016   Procedure: TRANSURETHRAL RESECTION OF BLADDER TUMOR (TURBT)-LARGE;  Surgeon: Hollice Espy, MD;  Location: [facility];  Service: Urology;  Laterality: N/A;  . TRANSURETHRAL RESECTION OF BLADDER TUMOR N/A 10/07/2017   Procedure: TRANSURETHRAL RESECTION OF BLADDER TUMOR (TURBT)-small;  Surgeon: Hollice Espy, MD;  Location: [facility];  Service: Urology;  Laterality: N/A;  . URETEROSCOPY Left 02/17/2016   Procedure: URETEROSCOPY;  Surgeon: Hollice Espy, MD;  Location: [facility];  Service: Urology;  Laterality: Left;  . URETEROSCOPY Right 10/12/2016   Procedure: URETEROSCOPY;  Surgeon: Hollice Espy, MD;  Location: [facility];  Service: Urology;  Laterality: Right;   Social History    Socioeconomic History  . Marital status: Married    Spouse name: Not on file  . Number of children: Not on file  . Years of education: Not on file  . Highest education level: Not on file  Occupational History  . Occupation: retired  Tobacco Use  . Smoking status: Former Smoker    Types: Cigarettes    Quit date: 07/20/2002    Years since quitting: 17.2  . Smokeless tobacco: Never Used  Substance and Sexual Activity  . Alcohol use: No  . Drug use: No  . Sexual activity: Never  Other Topics Concern  . Not on file  Social History Narrative  . Not on file   Social Determinants of Health   Financial Resource Strain:   . Difficulty of Paying Living Expenses:   Food Insecurity:   . Worried About Charity fundraiser in the Last Year:   . Arboriculturist in the Last Year:   Transportation Needs:   . Film/video editor (Medical):   Marland Kitchen Lack of Transportation (Non-Medical):   Physical Activity:   . Days of Exercise per Week:   . Minutes of Exercise per Session:   Stress:   . Feeling of Stress :   Social Connections:   . Frequency of Communication with Friends and Family:   . Frequency of Social Gatherings with Friends and Family:   . Attends Religious Services:   . Active Member of Clubs or Organizations:   . Attends Archivist Meetings:   Marland Kitchen Marital Status:    Family History  Problem Relation Age of Onset  . Diabetes Mother   . Cancer Father   . Breast cancer Neg Hx   . Kidney disease Neg Hx    Allergies  Allergen Reactions  . Glipizide Er Other (See Comments)    Hypoglycemia   . Percocet [Oxycodone-Acetaminophen] Hives   Prior to Admission medications   Medication Sig Start Date End Date Taking? Authorizing Provider  albuterol (PROVENTIL HFA;VENTOLIN HFA) 108 (90 Base) MCG/ACT inhaler Inhale 2 puffs into the lungs every 6 (six) hours as needed for wheezing or shortness of breath.  05/16/18  Yes [provider]  amLODipine (NORVASC) 5 MG  tablet Place 1 tablet (5 mg total) into feeding tube daily. 09/24/19  Yes Nolberto Hanlon, MD  atorvastatin (LIPITOR) 40 MG tablet Place 40 mg into feeding tube at bedtime.  05/01/19  Yes [provider]  B Complex-C (B-COMPLEX WITH VITAMIN C) tablet Place 1 tablet into feeding tube at bedtime. 09/24/19  Yes Nolberto Hanlon, MD  Calcium Acetate 667 MG TABS Place 1,334 mg into feeding tube 3 (three) times daily.   Yes [provider]  calcium carbonate (TUMS - DOSED IN MG ELEMENTAL CALCIUM) 500 MG chewable tablet Place 1 tablet into feeding tube at bedtime.    Yes [provider]  cholecalciferol (VITAMIN D) 1000 units tablet Take 1 tablet (1,000 Units total) by mouth daily. 09/24/19  Yes Nolberto Hanlon, MD  esomeprazole (NEXIUM) 40 MG packet 40 mg daily before breakfast. Per tube   Yes [provider]  ferrous sulfate 300 (60 Fe) MG/5ML syrup Place 324 mg into feeding tube at bedtime.   Yes [provider]  gabapentin (NEURONTIN) 300 MG capsule Place 2 capsules (600 mg total) into feeding tube 3 (three) times daily. 09/24/19  Yes Nolberto Hanlon, MD  heparin 5000 UNIT/ML injection Inject 1 mL (5,000 Units total) into the skin every 12 (twelve) hours. 09/24/19  Yes Nolberto Hanlon, MD  lacosamide 100 MG TABS Place 1 tablet (100 mg total) into feeding tube 2 (two) times daily. 09/24/19  Yes Nolberto Hanlon, MD  metoCLOPramide (REGLAN) 5 MG/5ML solution Place 5 mg into feeding tube every 8 (eight) hours.   Yes [provider]  multivitamin (RENA-VIT) TABS tablet Place 1 tablet into feeding tube at bedtime. 09/24/19  Yes Nolberto Hanlon, MD  Amino Acids-Protein Hydrolys (FEEDING SUPPLEMENT, PRO-STAT SUGAR FREE 64,) LIQD Place 30 mLs into feeding tube 2 (two) times daily. 09/24/19   Nolberto Hanlon, MD  Blood Glucose Monitoring Suppl (ACCU-CHEK AVIVA PLUS) w/Device KIT (USE TO MONITOR BLOOD SUGARS.) 12/30/17   [provider]  epoetin alfa (EPOGEN) 10000 UNIT/ML injection Inject 1 mL  (10,000 Units total) into the vein every Monday, Wednesday, and Friday with hemodialysis. Patient not taking: Reported on 10/14/2019 09/25/19   Nolberto Hanlon, MD  NON FORMULARY Place 1 Units into the nose at bedtime. CPAP Time of use 2100-0600    [provider]  OXYGEN Inhale 2 L into the lungs at bedtime.    [provider]   Recent Labs    10/13/19 2040 10/14/19 0938  WBC 11.0* 9.3  HGB 9.0* 9.2*  HCT 28.5* 29.0*  PLT 241 234  K 4.9 4.4  CL 94* 94*  CO2 25 23  BUN 145* 156*  CREATININE 12.22* 13.39*  GLUCOSE 116* 107*  CALCIUM 9.4 9.1  INR 1.0  --    DG Chest 1 View  Result Date: 10/13/2019 CLINICAL DATA:  79 year old female with left hip and leg pain after a fall yesterday. EXAM: CHEST  1 VIEW COMPARISON:  Chest CTA 10/03/2019 and earlier. FINDINGS: Supine AP view at 1938 hours. The patient is more rotated to the right. Chronic left innominate vein and axillary vascular stents. Stable mild cardiomegaly and mediastinal contours. Allowing for portable technique the lungs are clear. Visualized tracheal air column is within normal limits. No acute osseous abnormality identified. IMPRESSION: No acute cardiopulmonary abnormality. Electronically Signed   By: Genevie Ann M.D.   On: 10/13/2019 20:13   DG HIP UNILAT WITH PELVIS 2-3 VIEWS LEFT  Result Date: 10/13/2019 CLINICAL DATA:  80 year old female with left hip and leg pain after a fall yesterday. EXAM: DG HIP (WITH OR WITHOUT PELVIS) 2-3V LEFT COMPARISON:  Postoperative left hip radiographs 09/05/2019. 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 The femoral head component appears mildly impacted on the acetabular component, but no bone or hardware fracture is identified. Pubic rami appear stable. No pelvis fracture identified. Grossly intact proximal right femur. Pelvic and proximal lower extremity vascular calcifications. Negative visible bowel gas pattern. IMPRESSION: Superiorly dislocated left  hip arthroplasty. No fracture identified. Electronically Signed   By: Genevie Ann M.D.   On: 10/13/2019 20:11     Positive ROS: All other systems have been reviewed and were otherwise negative with the exception of those mentioned in the HPI and as above.  Physical Exam: BP (!) 148/66 (BP Location: Right Arm)   Pulse 75   Temp 97.7 F (36.5 C) (  Oral)   Resp 18   Ht _0  (1.626 m)   Wt 76 kg   SpO2 96%   BMI 28.76 kg/m  General:  Alert, no acute distress Psychiatric:  Patient is competent for consent with normal mood and affect   Cardiovascular:  No pedal edema, regular rate and rhythm Respiratory:  No wheezing, non-labored breathing GI:  Abdomen is soft and non-tender Skin:  No lesions in the area of chief complaint, no erythema Neurologic:  Sensation intact distally, CN grossly intact Lymphatic:  No axillary or cervical lymphadenopathy  Orthopedic Exam:  LLE: + DF/PF/EHL SILT grossly over foot Foot wwp +significant pain with attempted RoM Foot shortened and IR Posterior hip incision well-healed. No erythema or drainage.   X-rays:  As above: left hip periprosthetic dislocation  Assessment/Plan: Barbara [name] is a 79 y.o. female with a L left hip periprosthetic dislocation   1. I discussed the various treatment options including both surgical and non-surgical management of left hip periprosthetic dislocation with the patient and her husband. We discussed the high risk of perioperative complications due to patient's age and other co-morbidities. After discussion of risks, benefits, and alternatives to surgery, the family and patient were in agreement to proceed with closed reduction in the OR later today.  2. NPO until OR 3. Hold anticoagulation in advance of OR    Leim Fabry   10/14/2019 1:32 PM

## 2019-10-14 NOTE — Progress Notes (Signed)
Medication given for reported right hip pain. Patient attempting to pull lines off, reoriented with some success.

## 2019-10-14 NOTE — Transfer of Care (Signed)
Immediate Anesthesia Transfer of Care Note  Patient: Barbara [name]  Procedure(s) Performed: CLOSED REDUCTION HIP (Left Hip)  Patient Location: PACU  Anesthesia Type:General  Level of Consciousness: awake and alert   Airway & Oxygen Therapy: Patient Spontanous Breathing  Post-op Assessment: Report given to RN  Post vital signs: Reviewed and stable  Last Vitals:  Vitals Value Taken Time  BP 108/43 10/14/19 1419  Temp 37 C 10/14/19 1420  Pulse 58 10/14/19 1420  Resp 17 10/14/19 1420  SpO2 98 % 10/14/19 1420  Vitals shown include unvalidated device data.  Last Pain:  Vitals:   10/14/19 0816  TempSrc: Oral  PainSc:          Complications: No apparent anesthesia complications

## 2019-10-14 NOTE — Progress Notes (Signed)
Report given to [facility] on orthopedics.

## 2019-10-14 NOTE — Progress Notes (Signed)
Delayed starting tube feeds per dialysis RN Charlie. Will pass along to noc shift to start after dialysis.

## 2019-10-14 NOTE — Progress Notes (Signed)
Restless refuses to keep access arm  In place warned of dangers associated with moving access arm during dialysis treatment, continues restless

## 2019-10-14 NOTE — Op Note (Signed)
Operative Note    SURGERY DATE: 10/14/2019    PRE-OP DIAGNOSIS:  1. L prosthetic hip dislocation   POST-OP DIAGNOSIS:  1. L prosthetic hip dislocation   PROCEDURE(S): 1. Closed reduction of L hip   SURGEON: Cato Mulligan, MD    ANESTHESIA: Gen   ESTIMATED BLOOD LOSS: none   TOTAL IV FLUIDS: none  INDICATION(S): Barbara [name] is a 80 y.o. female with L prosthetic hip dislocation sustained after an attempted transfer from wheelchair to bed two days ago. Index THA was performed by Dr. Roland Rack on 09/05/19.  After discussion of risks, benefits, and alternatives to the procedure, the patient elected to proceed.    OPERATIVE FINDINGS: L hip dislocation   OPERATIVE REPORT:   The patient was seen in the Holding Room. The patient concurred with the proposed plan, giving informed consent. The site of surgery was properly noted/marked. The patient was taken to Operating Room. A Time Out was held and the patient identity, procedure, and laterality was confirmed. After administration of adequate anesthesia, reduction maneuver consisting of hip flexion with in-line traction was performed. A palpable clunk was felt as the hip reduced. X-rays were obtained to confirm reduction. A knee immobilizer was placed. The leg was kept in an abducted position with pillows in between the legs. The patient was awakened from anesthesia without any further complication and transferred to PACU for further recovery.     POST-OPERATIVE PLAN:  Patient will have posterior hip precautions. FFWB on operative extremity in knee immobilizer.

## 2019-10-14 NOTE — TOC Progression Note (Signed)
Transition of Care [facility] - Progression Note    Patient Details  Name: Barbara [name] MRN: [id number] Date of Birth: 1941/05/24  [facility]) CM/SW Contact  Boris Sharper, LCSW Phone Number:[phone] 10/14/2019, 3:00 PM  Clinical Narrative:    CSW began insurance Auth with [facility]. Ref number [id number]. Pt will go back to [facility] once auth approved.   TOC will continue to follow for discharge planning needs.           Expected Discharge Plan and Services                                                 Social Determinants of Health (SDOH) Interventions    Readmission Risk Interventions Readmission Risk Prevention Plan 09/29/2019  Transportation Screening Complete  Medication Review (RN Care Manager) Complete  Palliative Care Screening Not Applicable  Some recent data might be hidden

## 2019-10-14 NOTE — Progress Notes (Signed)
PT Cancellation Note  Patient Details Name: Barbara [name] MRN: [id number] DOB: 1941-01-12   Cancelled Treatment:    Reason Eval/Treat Not Completed: (Consult received and chart reviewed. Per notes, patient s/p L hip reduction under general anesthesia this date.  Per guidelines, will initiate PT evaluation next date.)   Eliyanna Ault H. Owens Shark, PT, DPT, NCS 10/14/19, 3:18 PM [phone]

## 2019-10-15 LAB — GLUCOSE, CAPILLARY
Glucose-Capillary: 106 mg/dL — ABNORMAL HIGH (ref 70–99)
Glucose-Capillary: 122 mg/dL — ABNORMAL HIGH (ref 70–99)
Glucose-Capillary: 123 mg/dL — ABNORMAL HIGH (ref 70–99)
Glucose-Capillary: 134 mg/dL — ABNORMAL HIGH (ref 70–99)
Glucose-Capillary: 69 mg/dL — ABNORMAL LOW (ref 70–99)
Glucose-Capillary: 96 mg/dL (ref 70–99)

## 2019-10-15 MED ORDER — EPOETIN ALFA 4000 UNIT/ML IJ SOLN
4000.0000 [IU] | INTRAMUSCULAR | Status: DC
  Filled 2019-10-15 (×2): qty 1

## 2019-10-15 MED ORDER — ZINC OXIDE 40 % EX OINT
TOPICAL_OINTMENT | CUTANEOUS | Status: DC | PRN
  Filled 2019-10-15: qty 113

## 2019-10-15 MED ORDER — DEXTROSE 50 % IV SOLN
INTRAVENOUS | Status: AC
  Administered 2019-10-15: 25 mL
  Filled 2019-10-15: qty 50

## 2019-10-15 NOTE — Progress Notes (Signed)
[facility] [place] NAME: Barbara [name]    MR#:  [id number]  DATE OF BIRTH:  1941-03-06  SUBJECTIVE:   [facility] after she had a mechanical fall and sustained a dislocated arthroplasty of left hip  Patient oriented times one. She knows she is in the hospital and had a fall. No Other issues per RN. Tolerating tube feeding. REVIEW OF SYSTEMS:   Review of Systems  Constitutional: Negative for chills, fever and weight loss.  HENT: Negative for ear discharge, ear pain and nosebleeds.   Eyes: Negative for blurred vision, pain and discharge.  Respiratory: Negative for sputum production, shortness of breath, wheezing and stridor.   Cardiovascular: Negative for chest pain, palpitations, orthopnea and PND.  Gastrointestinal: Negative for abdominal pain, diarrhea, nausea and vomiting.  Genitourinary: Negative for frequency and urgency.  Musculoskeletal: Negative for back pain and joint pain.  Neurological: Negative for sensory change, speech change, focal weakness and weakness.  Psychiatric/Behavioral: Negative for depression and hallucinations. The patient is not nervous/anxious.    Tolerating Diet: npo--> pt is on PEG tube feeding Tolerating PT: SNF  DRUG ALLERGIES:   Allergies  Allergen Reactions  . Glipizide Er Other (See Comments)    Hypoglycemia   . Percocet [Oxycodone-Acetaminophen] Hives    VITALS:  Blood pressure (!) 130/55, pulse 78, temperature 98.3 F (36.8 C), temperature source Oral, resp. rate 20, height 5' 4"  (1.626 m), weight 76 kg, SpO2 99 %.  PHYSICAL EXAMINATION:   Physical Exam Limited  GENERAL:  79 y.o.-year-old patient lying in the bed with no acute distress.Obese  HEENT: Head atraumatic, normocephalic. Oropharynx and nasopharynx clear.   LUNGS: Normal breath sounds bilaterally, no wheezing, rales, rhonchi. No use of accessory muscles of respiration.  CARDIOVASCULAR: S1, S2 normal. No  murmurs, rubs, or gallops.  ABDOMEN: Soft, nontender, nondistended. Bowel sounds present. No organomegaly or mass. PEG tube+ EXTREMITIES: No cyanosis, clubbing or edema b/l.    NEUROLOGIC: focal. Limited exam due to patient participation. No focal weakness PSYCHIATRIC:  patient is alert and awake SKIN:  Pressure Injury 09/21/19 Buttocks Right Stage 2 -  Partial thickness loss of dermis presenting as a shallow open injury with a red, pink wound bed without slough. 0.5cmx0.5cm (Active)  09/21/19 0830  Location: Buttocks  Location Orientation: Right  Staging: Stage 2 -  Partial thickness loss of dermis presenting as a shallow open injury with a red, pink wound bed without slough.  Wound Description (Comments): 0.5cmx0.5cm  Present on Admission: No     Pressure Injury 10/13/19 Buttocks Left Stage 2 -  Partial thickness loss of dermis presenting as a shallow open injury with a red, pink wound bed without slough. (Active)  10/13/19 2352  Location: Buttocks  Location Orientation: Left  Staging: Stage 2 -  Partial thickness loss of dermis presenting as a shallow open injury with a red, pink wound bed without slough.  Wound Description (Comments):   Present on Admission: Yes       LABORATORY PANEL:  CBC Recent Labs  Lab 10/14/19 0938  WBC 9.3  HGB 9.2*  HCT 29.0*  PLT 234    Chemistries  Recent Labs  Lab 10/14/19 0938  NA 138  K 4.4  CL 94*  CO2 23  GLUCOSE 107*  BUN 156*  CREATININE 13.39*  CALCIUM 9.1  AST 16  ALT 13  ALKPHOS 82  BILITOT 0.6   Cardiac Enzymes No results for input(s): TROPONINI  in the last 168 hours. RADIOLOGY:  DG Chest 1 View  Result Date: 10/13/2019 CLINICAL DATA:  79 year old female with left hip and leg pain after a fall yesterday. EXAM: CHEST  1 VIEW COMPARISON:  Chest CTA 10/03/2019 and earlier. FINDINGS: Supine AP view at 1938 hours. The patient is more rotated to the right. Chronic left innominate vein and axillary vascular stents. Stable  mild cardiomegaly and mediastinal contours. Allowing for portable technique the lungs are clear. Visualized tracheal air column is within normal limits. No acute osseous abnormality identified. IMPRESSION: No acute cardiopulmonary abnormality. Electronically Signed   By: Genevie Ann M.D.   On: 10/13/2019 20:13   DG Pelvis Portable  Result Date: 10/14/2019 CLINICAL DATA:  Status post left hip reduction. EXAM: PORTABLE PELVIS 1-2 VIEWS COMPARISON:  Left hip radiographs dated 10/13/2019 FINDINGS: There has been interval reduction of a left hip dislocation. A left hip hemiarthroplasty is noted. No acute osseous injury is identified. IMPRESSION: Interval reduction of a left hip dislocation. No acute osseous injury identified. Electronically Signed   By: Zerita Boers M.D.   On: 10/14/2019 15:11   DG HIP OPERATIVE UNILAT W OR W/O PELVIS LEFT  Result Date: 10/14/2019 CLINICAL DATA:  Close reduction left hip EXAM: OPERATIVE left HIP (WITH PELVIS IF PERFORMED) 1 VIEWS TECHNIQUE: Fluoroscopic spot image(s) were submitted for interpretation post-operatively. COMPARISON:  10/13/2018 FINDINGS: Single fluoroscopic images obtained during the performance of the procedure and is provided for interpretation only. Because of overlying structures, the image is nondiagnostic. Please refer to the operative report. FLUOROSCOPY TIME:  2 seconds IMPRESSION: 1. Intraoperative assistance as above. Electronically Signed   By: Randa Ngo M.D.   On: 10/14/2019 15:09   DG HIP UNILAT WITH PELVIS 2-3 VIEWS LEFT  Result Date: 10/13/2019 CLINICAL DATA:  79 year old female with left hip and leg pain after a fall yesterday. EXAM: DG HIP (WITH OR WITHOUT PELVIS) 2-3V LEFT COMPARISON:  Postoperative left hip radiographs 09/05/2019. 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 The femoral head component appears mildly impacted on the acetabular component, but no bone or hardware fracture is identified. Pubic  rami appear stable. No pelvis fracture identified. Grossly intact proximal right femur. Pelvic and proximal lower extremity vascular calcifications. Negative visible bowel gas pattern. IMPRESSION: Superiorly dislocated left hip arthroplasty. No fracture identified. Electronically Signed   By: Genevie Ann M.D.   On: 10/13/2019 20:11   ASSESSMENT AND PLAN:  Barbara [name] is a 79 y.o. female with medical history significant of diabetes, coronary artery disease, end-stage renal disease on hemodialysis on Tuesdays Thursdays and Saturdays, chronic bronchitis, GERD, hypertension, O2 dependence, osteoarthritis who had left total hip replacement on September 05, 2019.  She was in a skilled facility where she had a fall.  Patient sustained a periprosthetic dislocation.   #1 left  Prosthetic hip  Dislocation after mechanical fall - status post close reduction of hip POD #1 -physical therapy recommends rehab -Pain management.  -Appreciate orthopedic Dr. Serita Grit input  #2 coronary artery disease: Stable.    #3 essential hypertension:  -resume home amlodipine  #4 end-stage renal disease:  patient is on dialysis for Tuesday Thursday Saturday -potassium is 4.9 -in-house dialysis initiated. -Seen by Dr. Theador Hawthorne  #5 severe protein calorie malnutrition: Nutrition consult noted - resumed peg tube feeding after surgery -has history of chronic dysphagia and therefore requiring peg tube feeding (per old records)  #6 diabetes type II uncontrolled, peripheral neuropathy, CAD, CKD/ESRD:  -Sliding scale insulin  For now  -sugars 69--134  #7 h/o Seizure Do with most recent workup for encephalopathy/cognitive decline essentially negative -recent MRI no acute abnormality -recent EEG-- shows diffuse slowing consistent with encephalopathy -continue the vimpat  #8 hyperlipidemia -resume statin   DVT prophylaxis: Subcu heparin  Code Status: DNR prior to admission Family Communication: spoke with dter   Disposition Plan: Back to skilled facility--[facility], TOC consulted-- likely Monday Consults called: Dr. Posey Pronto, orthopedics, Dr. Theador Hawthorne nephrology   TOTAL TIME TAKING CARE OF THIS PATIENT: *25* minutes.  >50% time spent on counselling and coordination of care  Note: This dictation was prepared with Dragon dictation along with smaller phrase technology. Any transcriptional errors that result from this process are unintentional.  Fritzi Mandes M.D    [facility]   CC: Primary care physician; [name], MDPatient ID: Barbara [name], female   DOB: [birth year], 79 y.o.   MRN: [id number]

## 2019-10-15 NOTE — Evaluation (Signed)
Physical Therapy Evaluation Patient Details Name: Barbara [name] MRN: [id number] DOB: 05-Mar-1941 Today's Date: 10/15/2019   History of Present Illness  79 yo female with fall and L hip dislocation in SNF was readmitted for procedure to relocate L hip.  Recent admissions for acute GI bleed, had acute hypoxic respiratory failure and CHF exacerbation, was intubated and then on HFNC.  Original hosp was L total hip with posterior approach mid Feb 2021.  PMHx:  ESRD, HD, CHF, OA, bronchitis, GERD, HTN, O2 dependence, CAD, DM,    Clinical Impression  Pt was seen for bed mob and sitting balance control, noted her struggle to maintain sitting due to combined confusion and her difficulty with new constraint of LLE immobilizer.  Pt is not familiar with the new changes and cannot verbalize more than being aware of turning the L foot as being implicated in hip precautions.  Will expect her to transfer back to SNF for further rehab and will expect her to need extensive work as tolerated on Strengthening, sitting balance control, review of limits of hip surgery and work to resume standing with the limits of her WB and precautions.    Follow Up Recommendations SNF    Equipment Recommendations  None recommended by PT    Recommendations for Other Services       Precautions / Restrictions Precautions Precautions: Posterior Hip;Fall Precaution Booklet Issued: No Precaution Comments: PEG tube, L knee immobilizer Required Braces or Orthoses: Knee Immobilizer - Left Knee Immobilizer - Left: On at all times(to manage hip precautions) Restrictions Weight Bearing Restrictions: Yes LLE Weight Bearing: Touchdown weight bearing Other Position/Activity Restrictions: 20% with Flat foot on ground      Mobility  Bed Mobility Overal bed mobility: Needs Assistance Bed Mobility: Supine to Sit;Sit to Supine Rolling: Mod assist;+2 for physical assistance;+2 for safety/equipment(pillow to keep precautiosn)   Supine to  sit: Mod assist;+2 for physical assistance;+2 for safety/equipment Sit to supine: +2 for physical assistance;+2 for safety/equipment;Max assist   General bed mobility comments: actively resisting sitting on side of bed, unable to understand PT and follow through with cues  Transfers Overall transfer level: Needs assistance               General transfer comment: deferred due to resistance by pt  Ambulation/Gait                Stairs            Wheelchair Mobility    Modified Rankin (Stroke Patients Only)       Balance Overall balance assessment: Needs assistance;History of Falls Sitting-balance support: Bilateral upper extremity supported;Feet supported Sitting balance-Leahy Scale: Poor Sitting balance - Comments: unable to get pt to lean forward to engage core and assist balance control     Standing balance-Leahy Scale: (unable to attempt)                               Pertinent Vitals/Pain Pain Assessment: Faces Faces Pain Scale: Hurts little more Pain Location: L leg Pain Descriptors / Indicators: Operative site guarding Pain Intervention(s): Monitored during session;Repositioned;Premedicated before session    Home Living Family/patient expects to be discharged to:: Skilled nursing facility Living Arrangements: Spouse/significant other Available Help at Discharge: Family Type of Home: House Home Access: Ramped entrance     Home Layout: One level Home Equipment: Toilet riser;Bedside commode;Walker - 4 wheels;Shower seat Additional Comments: Pt minimally interactive, information gathered from previous  documentation    Prior Function Level of Independence: Needs assistance   Gait / Transfers Assistance Needed: SPC before all the incidents that have brought her to hosp  ADL's / Homemaking Assistance Needed: home with family but could help care for herself        Hand Dominance   Dominant Hand: Right    Extremity/Trunk  Assessment   Upper Extremity Assessment Upper Extremity Assessment: Generalized weakness    Lower Extremity Assessment Lower Extremity Assessment: Generalized weakness;LLE deficits/detail LLE Deficits / Details: L THA dislocation post approach LLE: Unable to fully assess due to immobilization LLE Coordination: decreased fine motor;decreased gross motor       Communication   Communication: Expressive difficulties  Cognition Arousal/Alertness: Awake/alert Behavior During Therapy: Flat affect Overall Cognitive Status: No family/caregiver present to determine baseline cognitive functioning Area of Impairment: Problem solving;Awareness;Safety/judgement;Following commands;Memory;Attention;Orientation                 Orientation Level: Time;Situation Current Attention Level: Selective Memory: Decreased recall of precautions;Decreased short-term memory Following Commands: Follows one step commands with increased time;Follows one step commands inconsistently Safety/Judgement: Decreased awareness of safety;Decreased awareness of deficits Awareness: Emergent;Intellectual Problem Solving: Decreased initiation;Slow processing;Difficulty sequencing;Requires verbal cues;Requires tactile cues General Comments: sleepy and flat, does not answer all questions      General Comments General comments (skin integrity, edema, etc.): pt is unable to assist with transfer to side of bed and back without two person help.  Pt is verbalizing some agreement but is confused and requires reminding about how to help    Exercises     Assessment/Plan    PT Assessment Patient needs continued PT services  PT Problem List Decreased strength;Decreased range of motion;Decreased activity tolerance;Decreased balance       PT Treatment Interventions      PT Goals (Current goals can be found in the Care Plan section)  Acute Rehab PT Goals Patient Stated Goal: to get up to the chair PT Goal Formulation:  With patient Time For Goal Achievement: 10/29/19 Potential to Achieve Goals: Fair    Frequency Min 2X/week   Barriers to discharge        Co-evaluation               AM-PAC PT "6 Clicks" Mobility  Outcome Measure Help needed turning from your back to your side while in a flat bed without using bedrails?: A Lot Help needed moving from lying on your back to sitting on the side of a flat bed without using bedrails?: A Lot Help needed moving to and from a bed to a chair (including a wheelchair)?: Total Help needed standing up from a chair using your arms (e.g., wheelchair or bedside chair)?: Total Help needed to walk in hospital room?: Total Help needed climbing 3-5 steps with a railing? : Total 6 Click Score: 8    End of Session Equipment Utilized During Treatment: Oxygen Activity Tolerance: Patient limited by fatigue;Treatment limited secondary to medical complications (Comment) Patient left: in bed;with call bell/phone within reach;with bed alarm set Nurse Communication: Mobility status PT Visit Diagnosis: Muscle weakness (generalized) (M62.81);Difficulty in walking, not elsewhere classified (R26.2);History of falling (Z91.81) Pain - Right/Left: Left Pain - part of body: Hip    Time: 6712-4580 PT Time Calculation (min) (ACUTE ONLY): 30 min   Charges:   PT Evaluation $PT Eval Moderate Complexity: 1 Mod PT Treatments $Therapeutic Activity: 8-22 mins       Ramond Dial 10/15/2019, 12:50 PM  Mee Hives,  PT MS Acute Rehab Dept. Number: [place] [facility] [place]

## 2019-10-15 NOTE — Progress Notes (Signed)
Barbara [name]  MRN: [id number]  DOB/AGE: [birth year] 79 y.o.  Primary Care Physician:Johnston, Jenny Reichmann, MD  Admit date: 10/13/2019  Chief Complaint:  Chief Complaint  Patient presents with  . Fall  . Hip Pain    S-Pt presented on  10/13/2019 with  Chief Complaint  Patient presents with  . Fall  . Hip Pain  . Patient main concern was I still have pain in my leg  Meds . amLODipine  5 mg Per Tube Daily  . atorvastatin  40 mg Per Tube QHS  . B-complex with vitamin C  1 tablet Per Tube QHS  . calcium acetate (Phos Binder)  1,334 mg Per Tube TID with meals  . calcium carbonate  1 tablet Per Tube QHS  . Chlorhexidine Gluconate Cloth  6 each Topical Q0600  . Chlorhexidine Gluconate Cloth  6 each Topical Q0600  . cholecalciferol  1,000 Units Oral Daily  . feeding supplement (NEPRO CARB STEADY)  1,000 mL Per Tube Q24H  . feeding supplement (PRO-STAT SUGAR FREE 64)  30 mL Per Tube BID  . ferrous sulfate  324 mg Per Tube QHS  . free water  75 mL Per Tube Q6H  . gabapentin  600 mg Per Tube TID  . heparin  5,000 Units Subcutaneous Q8H  . insulin aspart  0-5 Units Subcutaneous QHS  . insulin aspart  0-6 Units Subcutaneous TID WC  . lacosamide  100 mg Per Tube BID  . mouth rinse  15 mL Mouth Rinse BID  . multivitamin  1 tablet Per Tube QHS  . mupirocin ointment  1 application Nasal BID  . pantoprazole sodium  40 mg Oral QAC breakfast         MVE:HMCNO from the symptoms mentioned above,there are no other symptoms referable to all systems reviewed.  Physical Exam: Vital signs in last 24 hours: Temp:  [97.4 F (36.3 C)-98.6 F (37 C)] 98.3 F (36.8 C) (03/28 0800) Pulse Rate:  [52-91] 78 (03/28 0800) Resp:  [12-26] 20 (03/28 0800) BP: (85-143)/(43-91) 130/55 (03/28 0800) SpO2:  [96 %-100 %] 99 % (03/28 0800) Weight change:  Last BM Date: 10/10/19(per charting)  Intake/Output from previous day: 03/27 0701 - 03/28 0700 In: -  Out: 286  No intake/output data  recorded.   Physical Exam: General- pt is awake,alert, oriented to time place and person Resp- No acute REsp distress, CTA B/L NO Rhonchi CVS- S1S2 regular in rate and rhythm GIT- BS+, soft, NT, ND, PEG in situ EXT- NO LE Edema, Cyanosis Access AVG  Lab Results: CBC Recent Labs    10/13/19 2040 10/14/19 0938  WBC 11.0* 9.3  HGB 9.0* 9.2*  HCT 28.5* 29.0*  PLT 241 234    BMET Recent Labs    10/13/19 2040 10/14/19 0938  NA 139 138  K 4.9 4.4  CL 94* 94*  CO2 25 23  GLUCOSE 116* 107*  BUN 145* 156*  CREATININE 12.22* 13.39*  CALCIUM 9.4 9.1    MICRO Recent Results (from the past 240 hour(s))  SARS CORONAVIRUS 2 (TAT 6-24 HRS) Nasopharyngeal Nasopharyngeal Swab     Status: None   Collection Time: 10/05/19  3:11 PM   Specimen: Nasopharyngeal Swab  Result Value Ref Range Status   SARS Coronavirus 2 NEGATIVE NEGATIVE Corrected    Comment: RESULT CALLED TO, READ BACK BY AND VERIFIED WITH: THIS IS A CORRECTED REPORT. PREVIOUSLY CALLED REPORT WAS POSITIVE. CALLED TO N [facility] RN 10/06/19 1755 JDW Performed at [facility],  [address] [place], [place] [address] CORRECTED ON 03/19 AT 0947: PREVIOUSLY REPORTED AS POSITIVE RESULT CALLED TO, READ BACK BY AND VERIFIED WITH: RN  TINA MICHAEL AT [place] ON 10/06/2019   Respiratory Panel by RT PCR (Flu A&B, Covid) - Nasopharyngeal Swab     Status: None   Collection Time: 10/10/19  1:20 PM   Specimen: Nasopharyngeal Swab  Result Value Ref Range Status   SARS Coronavirus 2 by RT PCR NEGATIVE NEGATIVE Final    Comment: (NOTE) SARS-CoV-2 target nucleic acids are NOT DETECTED. The SARS-CoV-2 RNA is generally detectable in upper respiratoy specimens during the acute phase of infection. The lowest concentration of SARS-CoV-2 viral copies this assay can detect is 131 copies/mL. A negative result does not preclude SARS-Cov-2 infection and should not be used as the sole basis for treatment or other  patient management decisions. A negative result may occur with  improper specimen collection/handling, submission of specimen other than nasopharyngeal swab, presence of viral mutation(s) within the areas targeted by this assay, and inadequate number of viral copies (<131 copies/mL). A negative result must be combined with clinical observations, patient history, and epidemiological information. The expected result is Negative. Fact Sheet for Patients:  PinkCheek.be Fact Sheet for Healthcare Providers:  GravelBags.it This test is not yet ap proved or cleared by the [place] FDA and  has been authorized for detection and/or diagnosis of SARS-CoV-2 by FDA under an Emergency Use Authorization (EUA). This EUA will remain  in effect (meaning this test can be used) for the duration of the COVID-19 declaration under Section 564(b)(1) of the Act, 21 U.S.C. section 360bbb-3(b)(1), unless the authorization is terminated or revoked sooner.    Influenza A by PCR NEGATIVE NEGATIVE Final   Influenza B by PCR NEGATIVE NEGATIVE Final    Comment: (NOTE) The Xpert Xpress SARS-CoV-2/FLU/RSV assay is intended as an aid in  the diagnosis of influenza from Nasopharyngeal swab specimens and  should not be used as a sole basis for treatment. Nasal washings and  aspirates are unacceptable for Xpert Xpress SARS-CoV-2/FLU/RSV  testing. Fact Sheet for Patients: PinkCheek.be Fact Sheet for Healthcare Providers: GravelBags.it This test is not yet approved or cleared by the [place] FDA and  has been authorized for detection and/or diagnosis of SARS-CoV-2 by  FDA under an Emergency Use Authorization (EUA). This EUA will remain  in effect (meaning this test can be used) for the duration of the  Covid-19 declaration under Section 564(b)(1) of the Act, 21  U.S.C. section 360bbb-3(b)(1), unless  the authorization is  terminated or revoked. Performed at [facility], [address] [place], [place] [address]   Respiratory Panel by RT PCR (Flu A&B, Covid) - Nasopharyngeal Swab     Status: None   Collection Time: 10/13/19 10:09 PM   Specimen: Nasopharyngeal Swab  Result Value Ref Range Status   SARS Coronavirus 2 by RT PCR NEGATIVE NEGATIVE Final    Comment: (NOTE) SARS-CoV-2 target nucleic acids are NOT DETECTED. The SARS-CoV-2 RNA is generally detectable in upper respiratoy specimens during the acute phase of infection. The lowest concentration of SARS-CoV-2 viral copies this assay can detect is 131 copies/mL. A negative result does not preclude SARS-Cov-2 infection and should not be used as the sole basis for treatment or other patient management decisions. A negative result may occur with  improper specimen collection/handling, submission of specimen other than nasopharyngeal swab, presence of viral mutation(s) within the areas targeted by this assay, and  inadequate number of viral copies (<131 copies/mL). A negative result must be combined with clinical observations, patient history, and epidemiological information. The expected result is Negative. Fact Sheet for Patients:  PinkCheek.be Fact Sheet for Healthcare Providers:  GravelBags.it This test is not yet ap proved or cleared by the [place] FDA and  has been authorized for detection and/or diagnosis of SARS-CoV-2 by FDA under an Emergency Use Authorization (EUA). This EUA will remain  in effect (meaning this test can be used) for the duration of the COVID-19 declaration under Section 564(b)(1) of the Act, 21 U.S.C. section 360bbb-3(b)(1), unless the authorization is terminated or revoked sooner.    Influenza A by PCR NEGATIVE NEGATIVE Final   Influenza B by PCR NEGATIVE NEGATIVE Final    Comment: (NOTE) The Xpert Xpress SARS-CoV-2/FLU/RSV  assay is intended as an aid in  the diagnosis of influenza from Nasopharyngeal swab specimens and  should not be used as a sole basis for treatment. Nasal washings and  aspirates are unacceptable for Xpert Xpress SARS-CoV-2/FLU/RSV  testing. Fact Sheet for Patients: PinkCheek.be Fact Sheet for Healthcare Providers: GravelBags.it This test is not yet approved or cleared by the [place] FDA and  has been authorized for detection and/or diagnosis of SARS-CoV-2 by  FDA under an Emergency Use Authorization (EUA). This EUA will remain  in effect (meaning this test can be used) for the duration of the  Covid-19 declaration under Section 564(b)(1) of the Act, 21  U.S.C. section 360bbb-3(b)(1), unless the authorization is  terminated or revoked. Performed at [facility], [address] [place], [place] [address]   Surgical pcr screen     Status: Abnormal   Collection Time: 10/13/19 11:25 PM   Specimen: Nasal Mucosa; Nasal Swab  Result Value Ref Range Status   MRSA, PCR POSITIVE (A) NEGATIVE Final    Comment: RESULT CALLED TO, READ BACK BY AND VERIFIED WITH: TERESA COBLE ON 10/14/19 AT 0147 [facility]    Staphylococcus aureus POSITIVE (A) NEGATIVE Final    Comment: (NOTE) The Xpert SA Assay (FDA approved for NASAL specimens in patients 54 years of age and older), is one component of a comprehensive surveillance program. It is not intended to diagnose infection nor to guide or monitor treatment. Performed at [facility], [address] [place], [place] [address]       Lab Results  Component Value Date   PTH 310 (H) 09/06/2019   CALCIUM 9.1 10/14/2019   CAION 0.85 (LL) 09/05/2019   PHOS 4.7 (H) 10/09/2019               Impression: Patient is a 79 year old African-American female with a past medical history of ESRD on hemodialysis, hypertension, diabetes mellitus type 2, diabetic neuropathy,  seizure disorder, anemia of chronic disease, secondary hyperparathyroidism, history of acute GI bleeding, history of left hip replacement who was admitted with chief complaint of fall and found to have periprosthetic dislocation  1)Renal ESRD Patient is on hemodialysis Patient is on Monday Wednesday Friday schedule as an outpatient  patient first day of dialysis was Nov 20, 2016. Patient missed her dialysis yesterday as patient was here with a fall Patient was  dialyzed yesterday. No need for HD today   2)HTN Patient blood pressure is well controlled Patient is on calcium channel blockers   3)Anemia of chronic disease  HGb at goal (9--11) patient on Epogen during dialysis  4) secondary hyperparathyroidism-CKD Mineral-Bone Disorder Secondary Hyperparathyroidism present Phosphorus at goal. Patient is on binders  5) left hip dislocation Patient is to underwent  left hip reduction in the OR yesterday  6) electrolytes Sodium within normal range Potassium within normal range  7)Acid base Co2 at goal   Plan:   NO need for Hd today will dialyze on Monday      Brynleigh Sequeira s Brandis Wixted 10/15/2019, 9:29 AM

## 2019-10-15 NOTE — Plan of Care (Signed)

## 2019-10-15 NOTE — Progress Notes (Signed)
  Subjective: 1 Day Post-Op Procedure(s) (LRB): CLOSED REDUCTION HIP (Left) Patient sleeping peacefully in bed as I enter the room.  Objective: Vital signs in last 24 hours: Temp:  [97.4 F (36.3 C)-98.6 F (37 C)] 98.3 F (36.8 C) (03/28 0800) Pulse Rate:  [52-91] 78 (03/28 0800) Resp:  [12-26] 20 (03/28 0800) BP: (85-143)/(43-91) 130/55 (03/28 0800) SpO2:  [96 %-100 %] 99 % (03/28 0800)  Intake/Output from previous day:  Intake/Output Summary (Last 24 hours) at 10/15/2019 1120 Last data filed at 10/14/2019 2050 Gross per 24 hour  Intake --  Output 286 ml  Net -286 ml    Intake/Output this shift: No intake/output data recorded.  Labs: Recent Labs    10/13/19 2040 10/14/19 0938  HGB 9.0* 9.2*   Recent Labs    10/13/19 2040 10/14/19 0938  WBC 11.0* 9.3  RBC 3.06* 3.14*  HCT 28.5* 29.0*  PLT 241 234   Recent Labs    10/13/19 2040 10/14/19 0938  NA 139 138  K 4.9 4.4  CL 94* 94*  CO2 25 23  BUN 145* 156*  CREATININE 12.22* 13.39*  GLUCOSE 116* 107*  CALCIUM 9.4 9.1   Recent Labs    10/13/19 2040  INR 1.0     EXAM General - Patient is asleep Extremity - no noted leg length discrepancy, no abnormal rotation of the leg; knee immobilizer remains in place    Assessment/Plan: 1 Day Post-Op Procedure(s) (LRB): CLOSED REDUCTION HIP (Left) Principal Problem:   Anterior dislocation of left hip (HCC) Active Problems:   COPD (chronic obstructive pulmonary disease) (HCC)   Controlled type 2 diabetes mellitus with stage 4 chronic kidney disease, with long-term current use of insulin (HCC)   Essential hypertension   CAD in native artery   Weakness generalized   Protein-calorie malnutrition, severe   ESRD (end stage renal disease) (HCC)   Chronic diastolic CHF (congestive heart failure) (HCC)   Pressure injury of skin  Estimated body mass index is 28.76 kg/m as calculated from the following:   Height as of this encounter: 5' 4"  (1.626 m).   Weight as  of this encounter: 76 kg. Up with therapy Maintain posterior hip precautions.   Foot flat touchdown WB to left leg wearing knee immobilizer  Cassell Smiles, PA-C [facility] Orthopaedic Surgery 10/15/2019, 11:20 AM

## 2019-10-16 DIAGNOSIS — S73035D Other anterior dislocation of left hip, subsequent encounter: Secondary | ICD-10-CM

## 2019-10-16 LAB — CBC
HCT: 25.6 % — ABNORMAL LOW (ref 36.0–46.0)
Hemoglobin: 8 g/dL — ABNORMAL LOW (ref 12.0–15.0)
MCH: 29.4 pg (ref 26.0–34.0)
MCHC: 31.3 g/dL (ref 30.0–36.0)
MCV: 94.1 fL (ref 80.0–100.0)
Platelets: 168 K/uL (ref 150–400)
RBC: 2.72 MIL/uL — ABNORMAL LOW (ref 3.87–5.11)
RDW: 17.3 % — ABNORMAL HIGH (ref 11.5–15.5)
WBC: 7.7 K/uL (ref 4.0–10.5)
nRBC: 0 % (ref 0.0–0.2)

## 2019-10-16 LAB — CREATININE, SERUM
Creatinine, Ser: 9.89 mg/dL — ABNORMAL HIGH (ref 0.44–1.00)
GFR calc Af Amer: 4 mL/min — ABNORMAL LOW (ref >60)
GFR calc non Af Amer: 3 mL/min — ABNORMAL LOW (ref >60)

## 2019-10-16 LAB — GLUCOSE, CAPILLARY
Glucose-Capillary: 109 mg/dL — ABNORMAL HIGH (ref 70–99)
Glucose-Capillary: 112 mg/dL — ABNORMAL HIGH (ref 70–99)
Glucose-Capillary: 112 mg/dL — ABNORMAL HIGH (ref 70–99)
Glucose-Capillary: 113 mg/dL — ABNORMAL HIGH (ref 70–99)
Glucose-Capillary: 114 mg/dL — ABNORMAL HIGH (ref 70–99)
Glucose-Capillary: 120 mg/dL — ABNORMAL HIGH (ref 70–99)
Glucose-Capillary: 120 mg/dL — ABNORMAL HIGH (ref 70–99)

## 2019-10-16 LAB — SARS CORONAVIRUS 2 (TAT 6-24 HRS): SARS Coronavirus 2: NEGATIVE

## 2019-10-16 MED ORDER — PHENOL 1.4 % MT LIQD
1.0000 | OROMUCOSAL | Status: DC | PRN
  Filled 2019-10-16: qty 177

## 2019-10-16 MED ORDER — SODIUM CHLORIDE 0.9 % IV SOLN: INTRAVENOUS | Status: DC

## 2019-10-16 MED ORDER — METOCLOPRAMIDE HCL 5 MG/ML IJ SOLN: 5.0000 mg | Freq: Three times a day (TID) | INTRAMUSCULAR | Status: DC | PRN

## 2019-10-16 MED ORDER — METOCLOPRAMIDE HCL 10 MG PO TABS: 5.0000 mg | ORAL_TABLET | Freq: Three times a day (TID) | ORAL | Status: DC | PRN

## 2019-10-16 MED ORDER — DOCUSATE SODIUM 100 MG PO CAPS
100.0000 mg | ORAL_CAPSULE | Freq: Two times a day (BID) | ORAL | Status: DC
  Administered 2019-10-16 – 2019-10-17 (×3): 100 mg via ORAL
  Filled 2019-10-16 (×2): qty 1

## 2019-10-16 MED ORDER — SENNOSIDES-DOCUSATE SODIUM 8.6-50 MG PO TABS: 1.0000 | ORAL_TABLET | Freq: Every evening | ORAL | Status: DC | PRN

## 2019-10-16 MED ORDER — MENTHOL 3 MG MT LOZG
1.0000 | LOZENGE | OROMUCOSAL | Status: DC | PRN
  Filled 2019-10-16: qty 9

## 2019-10-16 MED ORDER — BISACODYL 10 MG RE SUPP: 10.0000 mg | Freq: Every day | RECTAL | Status: DC | PRN

## 2019-10-16 NOTE — TOC Progression Note (Signed)
Transition of Care [facility]) - Progression Note    Patient Details  Name: Barbara [name] MRN: [id number] Date of Birth: 1940-10-10  Transition of [facility]) CM/SW Contact  Alyia Lacerte, Gardiner Rhyme, LCSW Phone Number: 10/16/2019, 2:30 PM  Clinical Narrative: [facility] they have received the additional clinical information and will let this worker know when Josem Kaufmann has been decided. Tina-Peak aware of this           Expected Discharge Plan and Services                                                 Social Determinants of Health (SDOH) Interventions    Readmission Risk Interventions Readmission Risk Prevention Plan 09/29/2019  Transportation Screening Complete  Medication Review (RN Care Manager) Complete  Palliative Care Screening Not Applicable  Some recent data might be hidden

## 2019-10-16 NOTE — Progress Notes (Signed)
Physical Therapy Treatment Patient Details Name: Barbara [name] MRN: [id number] DOB: 01-Jul-1941 Today's Date: 10/16/2019    History of Present Illness 79 yo female with fall and L hip dislocation in SNF was readmitted for procedure to relocate L hip.  Recent admissions for acute GI bleed, had acute hypoxic respiratory failure and CHF exacerbation, was intubated and then on HFNC.  Original hosp was L total hip with posterior approach mid Feb 2021.  PMHx:  ESRD, HD, CHF, OA, bronchitis, GERD, HTN, O2 dependence, CAD, DM,      PT Comments    Pt is making limited progress towards goals secondary to lethargy level this date. Attempted functional there-ex, however pt only able to attend for a few seconds prior to lethargic. Will continue to progress as able.    Follow Up Recommendations  SNF     Equipment Recommendations  None recommended by PT    Recommendations for Other Services       Precautions / Restrictions Precautions Precautions: Posterior Hip;Fall Precaution Booklet Issued: No Precaution Comments: PEG tube, L knee immobilizer Required Braces or Orthoses: Knee Immobilizer - Left Knee Immobilizer - Left: On at all times Restrictions Weight Bearing Restrictions: Yes LLE Weight Bearing: Touchdown weight bearing Other Position/Activity Restrictions: 20% with Flat foot on ground    Mobility  Bed Mobility               General bed mobility comments: unable to perform at this time  Transfers                 General transfer comment: unable  Ambulation/Gait                 Stairs             Wheelchair Mobility    Modified Rankin (Stroke Patients Only)       Balance                                            Cognition Arousal/Alertness: Lethargic                                     General Comments: sleepy, only arousable for a few seconds and then falls back asleep      Exercises Other  Exercises Other Exercises: supine ther-ex attempted to promote arousal level. Max/total assist for B LE SLR/hip abd/add. Pt only able to stay awake briefly, then falls back asleep. Minimal participation    General Comments        Pertinent Vitals/Pain Pain Assessment: No/denies pain    Home Living                      Prior Function            PT Goals (current goals can now be found in the care plan section) Acute Rehab PT Goals Patient Stated Goal: unable to state PT Goal Formulation: Patient unable to participate in goal setting Time For Goal Achievement: 10/29/19 Potential to Achieve Goals: Fair Progress towards PT goals: Not progressing toward goals - comment    Frequency    Min 2X/week      PT Plan Current plan remains appropriate    Co-evaluation  AM-PAC PT "6 Clicks" Mobility   Outcome Measure  Help needed turning from your back to your side while in a flat bed without using bedrails?: Total Help needed moving from lying on your back to sitting on the side of a flat bed without using bedrails?: Total Help needed moving to and from a bed to a chair (including a wheelchair)?: Total Help needed standing up from a chair using your arms (e.g., wheelchair or bedside chair)?: Total Help needed to walk in hospital room?: Total Help needed climbing 3-5 steps with a railing? : Total 6 Click Score: 6    End of Session Equipment Utilized During Treatment: Oxygen Activity Tolerance: Patient limited by lethargy Patient left: in bed;with call bell/phone within reach;with bed alarm set Nurse Communication: Mobility status PT Visit Diagnosis: Muscle weakness (generalized) (M62.81);Difficulty in walking, not elsewhere classified (R26.2);History of falling (Z91.81) Pain - Right/Left: Left Pain - part of body: Hip     Time: 3013-1438 PT Time Calculation (min) (ACUTE ONLY): 8 min  Charges:  $Therapeutic Exercise: 8-22 mins                      Greggory Stallion, PT, DPT [phone]    Barbara [name] 10/16/2019, 12:53 PM

## 2019-10-16 NOTE — Progress Notes (Signed)
This note also relates to the following rows which could not be included: Pulse Rate - Cannot attach notes to unvalidated device data Resp - Cannot attach notes to unvalidated device data BP - Cannot attach notes to unvalidated device data  Hd completed

## 2019-10-16 NOTE — Care Management Important Message (Signed)
Important Message  Patient Details  Name: Barbara [name] MRN: [id number] Date of Birth: 09-Jul-1941   Medicare Important Message Given:  Yes     [name] 10/16/2019, 10:45 AM

## 2019-10-16 NOTE — Progress Notes (Signed)
[facility]  ROUNDING NOTE   Subjective:  Patient resting comfortably in bed at the moment. Due for hemodialysis treatment today.   Objective:  Vital signs in last 24 hours:  Temp:  [97.7 F (36.5 C)-98.3 F (36.8 C)] 97.7 F (36.5 C) (03/29 1845) Pulse Rate:  [67-81] 71 (03/29 1900) Resp:  [13-20] 15 (03/29 1900) BP: (103-137)/(45-74) 121/55 (03/29 1900) SpO2:  [99 %-100 %] 100 % (03/29 0800)  Weight change:  Filed Weights   10/13/19 1913  Weight: 76 kg    Intake/Output: I/O last 3 completed shifts: In: 728.7 [NG/GT:728.7] Out: 1004 [Other:1000; Stool:4]   Intake/Output this shift:  No intake/output data recorded.  Physical Exam: General: NAD, patient resting in bed   Head: Normocephalic, atraumatic. Moist oral mucosal membranes  Eyes: Anicteric,   Lungs:  Clear to auscultation, normal effort  Heart: No rub  Abdomen:  Soft, nontender  Extremities: No peripheral edema.  Neurologic: Alert, able to follow simple commands  Skin: No lesions  Access: Left arm AVG    Basic Metabolic Panel: Recent Labs  Lab 10/10/19 1054 10/10/19 1054 10/11/19 0843 10/13/19 2040 10/14/19 0938 10/16/19 1209  NA 140  --  140 139 138  --   K 3.7  --  3.3* 4.9 4.4  --   CL 96*  --  100 94* 94*  --   CO2 27  --  25 25 23   --   GLUCOSE 134*  --  136* 116* 107*  --   BUN 104*  --  112* 145* 156*  --   CREATININE 8.54*  --  8.00* 12.22* 13.39* 9.89*  CALCIUM 8.7*   < > 8.8* 9.4 9.1  --    < > = values in this interval not displayed.    Liver Function Tests: Recent Labs  Lab 10/11/19 0843 10/13/19 2040 10/14/19 0938  AST 17 21 16   ALT 17 10 13   ALKPHOS 76 86 82  BILITOT 0.7 0.6 0.6  PROT 7.1 7.6 7.1  ALBUMIN 3.0* 3.1* 2.9*   No results for input(s): LIPASE, AMYLASE in the last 168 hours. No results for input(s): AMMONIA in the last 168 hours.  CBC: Recent Labs  Lab 10/10/19 1054 10/11/19 0843 10/13/19 2040 10/14/19 0938 10/16/19 1209  WBC 16.0*  14.4* 11.0* 9.3 7.7  NEUTROABS  --  12.2*  --   --   --   HGB 9.9* 9.1* 9.0* 9.2* 8.0*  HCT 30.7* 28.3* 28.5* 29.0* 25.6*  MCV 90.6 91.9 93.1 92.4 94.1  PLT 267 281 241 234 168    Cardiac Enzymes: No results for input(s): CKTOTAL, CKMB, CKMBINDEX, TROPONINI in the last 168 hours.  BNP: Invalid input(s): POCBNP  CBG: Recent Labs  Lab 10/15/19 2113 10/16/19 0004 10/16/19 0418 10/16/19 0805 10/16/19 1142  GLUCAP 106* 113* 114* 120* 120*    Microbiology: Results for orders placed or performed during the hospital encounter of 10/13/19  Respiratory Panel by RT PCR (Flu A&B, Covid) - Nasopharyngeal Swab     Status: None   Collection Time: 10/13/19 10:09 PM   Specimen: Nasopharyngeal Swab  Result Value Ref Range Status   SARS Coronavirus 2 by RT PCR NEGATIVE NEGATIVE Final    Comment: (NOTE) SARS-CoV-2 target nucleic acids are NOT DETECTED. The SARS-CoV-2 RNA is generally detectable in upper respiratoy specimens during the acute phase of infection. The lowest concentration of SARS-CoV-2 viral copies this assay can detect is 131 copies/mL. A negative result does not preclude SARS-Cov-2 infection  and should not be used as the sole basis for treatment or other patient management decisions. A negative result may occur with  improper specimen collection/handling, submission of specimen other than nasopharyngeal swab, presence of viral mutation(s) within the areas targeted by this assay, and inadequate number of viral copies (<131 copies/mL). A negative result must be combined with clinical observations, patient history, and epidemiological information. The expected result is Negative. Fact Sheet for Patients:  PinkCheek.be Fact Sheet for Healthcare Providers:  GravelBags.it This test is not yet ap proved or cleared by the [place] FDA and  has been authorized for detection and/or diagnosis of SARS-CoV-2 by FDA under  an Emergency Use Authorization (EUA). This EUA will remain  in effect (meaning this test can be used) for the duration of the COVID-19 declaration under Section 564(b)(1) of the Act, 21 U.S.C. section 360bbb-3(b)(1), unless the authorization is terminated or revoked sooner.    Influenza A by PCR NEGATIVE NEGATIVE Final   Influenza B by PCR NEGATIVE NEGATIVE Final    Comment: (NOTE) The Xpert Xpress SARS-CoV-2/FLU/RSV assay is intended as an aid in  the diagnosis of influenza from Nasopharyngeal swab specimens and  should not be used as a sole basis for treatment. Nasal washings and  aspirates are unacceptable for Xpert Xpress SARS-CoV-2/FLU/RSV  testing. Fact Sheet for Patients: PinkCheek.be Fact Sheet for Healthcare Providers: GravelBags.it This test is not yet approved or cleared by the [place] FDA and  has been authorized for detection and/or diagnosis of SARS-CoV-2 by  FDA under an Emergency Use Authorization (EUA). This EUA will remain  in effect (meaning this test can be used) for the duration of the  Covid-19 declaration under Section 564(b)(1) of the Act, 21  U.S.C. section 360bbb-3(b)(1), unless the authorization is  terminated or revoked. Performed at [facility], [address], [place], [place] [address]   Surgical pcr screen     Status: Abnormal   Collection Time: 10/13/19 11:25 PM   Specimen: Nasal Mucosa; Nasal Swab  Result Value Ref Range Status   MRSA, PCR POSITIVE (A) NEGATIVE Final    Comment: RESULT CALLED TO, READ BACK BY AND VERIFIED WITH: TERESA COBLE ON 10/14/19 AT 0147 [facility]    Staphylococcus aureus POSITIVE (A) NEGATIVE Final    Comment: (NOTE) The Xpert SA Assay (FDA approved for NASAL specimens in patients 79 years of age and older), is one component of a comprehensive surveillance program. It is not intended to diagnose infection nor to guide or monitor treatment. Performed at  [facility], [address] [place], [place] [address]     Coagulation Studies: Recent Labs    10/13/19 2038/09/21  LABPROT 12.9  INR 1.0    Urinalysis: No results for input(s): COLORURINE, LABSPEC, PHURINE, GLUCOSEU, HGBUR, BILIRUBINUR, KETONESUR, PROTEINUR, UROBILINOGEN, NITRITE, LEUKOCYTESUR in the last 72 hours.  Invalid input(s): APPERANCEUR    Imaging: No results found.   Medications:   . sodium chloride Stopped (10/13/19 September 21, 2108)   . amLODipine  5 mg Per Tube Daily  . atorvastatin  40 mg Per Tube QHS  . B-complex with vitamin C  1 tablet Per Tube QHS  . calcium acetate (Phos Binder)  1,334 mg Per Tube TID with meals  . calcium carbonate  1 tablet Per Tube QHS  . Chlorhexidine Gluconate Cloth  6 each Topical Q0600  . Chlorhexidine Gluconate Cloth  6 each Topical Q0600  . cholecalciferol  1,000 Units Oral Daily  . docusate sodium  100 mg  Oral BID  . epoetin (EPOGEN/PROCRIT) injection  4,000 Units Subcutaneous Q M,W,F-HD  . feeding supplement (NEPRO CARB STEADY)  1,000 mL Per Tube Q24H  . feeding supplement (PRO-STAT SUGAR FREE 64)  30 mL Per Tube BID  . ferrous sulfate  324 mg Per Tube QHS  . free water  75 mL Per Tube Q6H  . gabapentin  600 mg Per Tube TID  . heparin  5,000 Units Subcutaneous Q8H  . insulin aspart  0-5 Units Subcutaneous QHS  . insulin aspart  0-6 Units Subcutaneous TID WC  . lacosamide  100 mg Per Tube BID  . mouth rinse  15 mL Mouth Rinse BID  . multivitamin  1 tablet Per Tube QHS  . mupirocin ointment  1 application Nasal BID  . pantoprazole sodium  40 mg Oral QAC breakfast   albuterol, bisacodyl, heparin, lidocaine (PF), lidocaine-prilocaine, liver oil-zinc oxide, menthol-cetylpyridinium **OR** phenol, metoCLOPramide **OR** metoCLOPramide (REGLAN) injection, ondansetron **OR** ondansetron (ZOFRAN) IV, senna-docusate, traMADol  Assessment/ Plan:  Barbara [name] is a 79 y.o. black female with end stage renal disease on hemodialysis,  hypertension, diabetes mellitus type II, diabetic neuropathy, seizure disorder, left hip repair, anemia of chronic kidney disease, secondary hyperparathyroidism. Dislocation closed [T14.8XXA] Fall [W19.XXXA] Anterior dislocation of left hip, initial encounter ([place]) [S73.035A]   [facility] LUE AVG 72 kg  FDODE 11/20/2016   1.  ESRD on HD:  Patient due for dialysis treatment today.  Orders have been prepared.   2. Anemia of chronic kidney disease.  With GI bleed.  EGD from March 11 showed nonbleeding gastric ulcer Requiring PRBC transfusion on 3/10 and 3/11.  -Patient to receive Epogen 4000 units today. Lab Results  Component Value Date   HGB 8.0 (L) 10/16/2019     3.  Secondary hyperparathyroidism.  Lab Results  Component Value Date   PTH 310 (H) 09/06/2019   CALCIUM 9.1 10/14/2019   CAION 0.85 (LL) 09/05/2019   PHOS 4.7 (H) 10/09/2019  Continue to monitor bone mineral metabolism parameters.    4.  Confusion, delirium, acute encephalopathy Has periods of intermittent confusion.  Appeared stable today.     LOS: 3 Charyl Minervini 3/29/20217:48 PM

## 2019-10-16 NOTE — Evaluation (Signed)
Occupational Therapy Evaluation Patient Details Name: Barbara [name] MRN: [id number] DOB: Nov 30, 1940 Today's Date: 10/16/2019    History of Present Illness 79 yo female with fall and L hip dislocation in SNF was readmitted for procedure to relocate L hip.  Recent admissions for acute GI bleed, had acute hypoxic respiratory failure and CHF exacerbation, was intubated and then on HFNC.  Original hosp was L total hip with posterior approach mid Feb 2021.  PMHx:  ESRD, HD, CHF, OA, bronchitis, GERD, HTN, O2 dependence, CAD, DM,     Clinical Impression   Pt was seen for OT evaluation this date. Prior to hospital admission, pt was at [facility] following hospitalizations for L THA and GIB. At baseline however, pt lives with spouse in [facility] with ramped entrance. Currently pt demonstrates impairments as described below (See OT problem list) which functionally limit her ability to perform ADL/self-care tasks. Pt currently requires MAX to TOTAL A +2 for lateral rolling in bed, toileting, repositioning, and requires MAX to TOTAL A +1 to engage in UB self care. Pt demos some confusion/agitation with minimal fxl task participation noted on evaluation this date. Pt's spouse reports that she is usually more mentally clear on non-dialysis days.  Based on today's assessment, pt would benefit from skilled OT to address noted impairments and functional limitations (see below for any additional details) in order to maximize safety and independence while minimizing falls risk and caregiver burden. Upon hospital discharge, recommend STR to maximize pt safety and return to PLOF.     Follow Up Recommendations  SNF    Equipment Recommendations  Other (comment)(defer to next level of care)    Recommendations for Other Services       Precautions / Restrictions Precautions Precautions: Posterior Hip;Fall Precaution Booklet Issued: No Precaution Comments: PEG tube, L knee immobilizer Required Braces or Orthoses: Knee Immobilizer  - Left Knee Immobilizer - Left: On at all times Restrictions Weight Bearing Restrictions: Yes LLE Weight Bearing: Touchdown weight bearing Other Position/Activity Restrictions: 20% with Flat foot on ground      Mobility Bed Mobility Overal bed mobility: Needs Assistance Bed Mobility: Rolling Rolling: Max assist;Total assist;+2 for physical assistance         General bed mobility comments: unable to perform at this time  Transfers                 General transfer comment: unable    Balance                                           ADL either performed or assessed with clinical judgement   ADL Overall ADL's : Needs assistance/impaired     Grooming: Wash/dry face;Bed level;Maximal assistance;Total assistance Grooming Details (indicate cue type and reason): with OT providing hand over hand and initiating, pt does appear to make some movement I'ly, but does not appear to be functional planned motions related to task at hand.                     Toileting- Clothing Manipulation and Hygiene: +2 for physical assistance;+2 for safety/equipment;Total assistance Toileting - Clothing Manipulation Details (indicate cue type and reason): TOTAL A (CNA and OT) for peri care at bed level using lateral rolling techniuqe.             Vision Baseline Vision/History: Wears glasses Wears Glasses: Reading only Additional  Comments: difficult to formally assess. Pt does not make eye contact with therapist. Pt with prolonged periods of eye closing. When she does open them, appears to look past therapist. Does appear to track husband better. Visual attention selective.     Perception     Praxis      Pertinent Vitals/Pain Pain Assessment: Faces Faces Pain Scale: Hurts little more Pain Location: L leg Pain Descriptors / Indicators: Operative site guarding Pain Intervention(s): Monitored during session;Repositioned     Hand Dominance Right    Extremity/Trunk Assessment Upper Extremity Assessment Upper Extremity Assessment: Generalized weakness;Difficult to assess due to impaired cognition(pt seen moving her arms through full arc of motion when agitated/attempting to get staff away from her, but unable to formally assess strength/ROM. Pt does appear to have fxl grip strength and assist in holding bed rail once rolled to side-lying.)   Lower Extremity Assessment Lower Extremity Assessment: Defer to PT evaluation;Generalized weakness;LLE deficits/detail LLE: Unable to fully assess due to immobilization       Communication Communication Communication: Expressive difficulties(garbled)   Cognition Arousal/Alertness: Lethargic Behavior During Therapy: Flat affect;Agitated(some instances of agitation with positional changes.) Overall Cognitive Status: Impaired/Different from baseline(Spouse reports that pt would maybe occasionally be confused prior, but primarily oriented and clear conversationally) Area of Impairment: Problem solving;Awareness;Safety/judgement;Following commands;Memory;Attention;Orientation                 Orientation Level: Disoriented to;Time;Situation Current Attention Level: Selective Memory: Decreased recall of precautions;Decreased short-term memory Following Commands: Follows one step commands with increased time;Follows one step commands inconsistently Safety/Judgement: Decreased awareness of safety;Decreased awareness of deficits   Problem Solving: Decreased initiation;Slow processing;Difficulty sequencing;Requires verbal cues;Requires tactile cues General Comments: Pt with intermittent wakefullness throughout session, mostly sleepy and hard to rouse.  Spouse reports pt more clear on days w/o HD. Some instances of being conversational, or even cussing/aggitated. Mostly responsive when husband communicates with pt.   General Comments       Exercises Other Exercises Other Exercises: OT facilitates  education with pt's spouse who is present in room re: benefits of some level of activity even on HD days to maintain strength/independence at highest attainable level. Pt's spouse repeats that pt "doesn't do anything but sleep" on Dialysis days.   Shoulder Instructions      Home Living Family/patient expects to be discharged to:: Skilled nursing facility Living Arrangements: Spouse/significant other Available Help at Discharge: Family Type of Home: House Home Access: Ramped entrance     Home Layout: One level     Bathroom Shower/Tub: Teacher, early years/pre: Standard     Home Equipment: Toilet riser;Bedside commode;Walker - 4 wheels;Shower seat   Additional Comments: Pt minimally interactive, information gathered from previous documentation and from spouse who is present on evaluation      Prior Functioning/Environment Level of Independence: Needs assistance  Gait / Transfers Assistance Needed: Pt's spouse reports she was primarily using [facility] prior to several recent hospital admissions. ADL's / Homemaking Assistance Needed: Pt was apparently able to help care for herself with most BADLs prior to initial hospital admission.            OT Problem List: Decreased strength;Decreased coordination;Decreased cognition;Decreased activity tolerance;Decreased safety awareness;Cardiopulmonary status limiting activity;Pain;Impaired balance (sitting and/or standing);Decreased knowledge of use of DME or AE;Decreased knowledge of precautions;Impaired UE functional use;Decreased range of motion      OT Treatment/Interventions: Self-care/ADL training;Therapeutic exercise;Therapeutic activities;DME and/or AE instruction;Patient/family education;Balance training;Neuromuscular education;Cognitive remediation/compensation    OT Goals(Current goals can  be found in the care plan section) Acute Rehab OT Goals Patient Stated Goal: husband would like to see patient able to move more OT Goal  Formulation: Patient unable to participate in goal setting Time For Goal Achievement: 10/30/19 Potential to Achieve Goals: Fair  OT Frequency: Min 2X/week   Barriers to D/C:            Co-evaluation              AM-PAC OT "6 Clicks" Daily Activity     Outcome Measure Help from another person eating meals?: A Lot Help from another person taking care of personal grooming?: Total Help from another person toileting, which includes using toliet, bedpan, or urinal?: Total Help from another person bathing (including washing, rinsing, drying)?: Total Help from another person to put on and taking off regular upper body clothing?: Total Help from another person to put on and taking off regular lower body clothing?: Total 6 Click Score: 7   End of Session Nurse Communication: Mobility status;Other (comment)(communicated to CNA that pt without purewick, but system appears to be setup.)  Activity Tolerance: Patient limited by lethargy Patient left: in bed;with call bell/phone within reach;with bed alarm set;with family/visitor present(spouse present)  OT Visit Diagnosis: Other abnormalities of gait and mobility (R26.89);Pain;History of falling (Z91.81) Pain - Right/Left: Left Pain - part of body: Hip                Time: 8871-9597 OT Time Calculation (min): 23 min Charges:  OT General Charges $OT Visit: 1 Visit OT Evaluation $OT Eval Moderate Complexity: 1 Mod OT Treatments $Self Care/Home Management : 8-22 mins  Gerrianne Scale, MS, OTR/L ascom [phone] 10/16/19, 2:53 PM

## 2019-10-16 NOTE — Progress Notes (Addendum)
Patient has a pending referral for [facility] community Palliative program to follow at [facility]. TOC Becky Dupree aware. Thank you Flo Shanks BSN, RN, [place] [phone]

## 2019-10-16 NOTE — Progress Notes (Signed)
Hd started.

## 2019-10-16 NOTE — Progress Notes (Signed)
Pre dialysis assessment, patient responds yes when asked if she wants to do dialysis today.

## 2019-10-16 NOTE — NC FL2 (Signed)
[place] LEVEL OF CARE SCREENING TOOL     IDENTIFICATION  Patient Name: Barbara [name] Birthdate: [birth year] Sex: female Admission Date (Current Location): 10/13/2019  [place] and [place] Number:  Selena Lesser [id number] [place] and Address:  [facility], [address] [place], [place] [address]      Provider Number: [id number]  Attending Physician Name and Address:  Louellen Molder, MD  Relative Name and Phone Number:  Yaeko Fazekas husband  [phone]    Current Level of Care: Hospital Recommended Level of Care: [place] Prior Approval Number:    Date Approved/Denied:   PASRR Number: [id number]  Discharge Plan: SNF    Current Diagnoses: Patient Active Problem List   Diagnosis Date Noted  . Pressure injury of skin 10/14/2019  . Anterior dislocation of left hip ([place]) 10/13/2019  . Acute GI bleeding 09/28/2019  . Acute respiratory failure with hypoxia ([place]) 09/28/2019  . Chronic diastolic CHF (congestive heart failure) ([place]) 09/28/2019  . Seizure ([name]) 09/28/2019  . PEG tube malfunction ([place]) 09/28/2019  . Sacral ulcer ([place]) 09/23/2019  . Dysphagia 09/19/2019  . Acute encephalopathy   . Goals of care, counseling/discussion   . Aspiration pneumonia ([place]) 09/11/2019  . Status post total hip replacement, left 09/05/2019  . Type II diabetes mellitus with renal manifestations ([place]) 09/05/2019  . ESRD on dialysis ([place]) 09/05/2019  . Bacteremia due to other bacteria   . Bacteroides fragilis infection   . Acute metabolic encephalopathy   . Somnolence   . Lobar pneumonia ([place]) 05/11/2019  . Dialysis patient ([place])   . Primary osteoarthritis of left hip 04/07/2019  . Metabolic acidosis 01/17/1600  . Complication from renal dialysis device 01/28/2018  . History of colonic polyps 07/30/2017  . Presence of cardiac and vascular implant and graft   . Fever   . Sepsis ([place])   . ESRD (end stage renal disease) ([place])  02/01/2017  . Sepsis secondary to UTI ([place]) 01/24/2017  . Acute kidney injury ([place]) 11/19/2016  . Anemia, chronic renal failure, stage 4 (severe) ([place]) 09/17/2016  . Hypoglycemia 06/04/2016  . Hyperkalemia 05/12/2016  . Protein-calorie malnutrition, severe 05/05/2016  . Acute on chronic renal failure ([place]) 05/04/2016  . Convulsions ([place]) 04/17/2016  . Palliative care by specialist   . DNR (do not resuscitate) discussion   . C. difficile diarrhea 03/11/2016  . Anemia 03/11/2016  . Hypotension 03/11/2016  . Acidosis 03/11/2016  . Failure to thrive (child) 03/11/2016  . Weakness generalized 03/11/2016  . ARF (acute renal failure) ([place]) 03/04/2016  . Cancer of trigone of urinary bladder ([place]) 03/02/2016  . Absolute anemia 02/04/2016  . Airway hyperreactivity 02/04/2016  . Celiac disease 02/04/2016  . Gastric catarrh 02/04/2016  . Acid reflux 02/04/2016  . Combined fat and carbohydrate induced hyperlipemia 02/04/2016  . C. difficile colitis 01/22/2016  . Urinary obstruction 01/21/2016  . COPD (chronic obstructive pulmonary disease) ([place]) 01/21/2016  . Controlled type 2 diabetes mellitus with stage 4 chronic kidney disease, with long-term current use of insulin ([place]) 01/21/2016  . Essential hypertension 01/21/2016  . Acute renal failure superimposed on stage 4 chronic kidney disease ([place]) 01/20/2016  . Leukocytosis 01/20/2016  . Anemia in chronic kidney disease 01/20/2016  . Arthritis of knee, degenerative 05/10/2015  . Knee strain 03/26/2015  . Other intervertebral disc displacement, lumbar region 03/04/2015  . Degenerative arthritis of lumbar spine 03/04/2015  . HNP (herniated nucleus pulposus), lumbar 03/04/2015  . Osteoarthritis of spine with radiculopathy, lumbar region 03/04/2015  .  Injury of tendon of upper extremity 11/12/2014  . Atherosclerosis of abdominal aorta ([place]) 11/02/2014  . Chronic kidney disease, stage IV (severe) ([place]) 11/02/2014  . Obstructive apnea 11/02/2014   . Complete rotator cuff rupture of left shoulder 10/05/2014  . Arthritis of shoulder region, degenerative 10/05/2014  . CAD in native artery 12/08/2013  . Benign essential HTN 12/08/2013  . Type 2 diabetes mellitus ([place]) 12/08/2013    Orientation RESPIRATION BLADDER Height & Weight     Self  O2(2 liters continous) Incontinent Weight: 167 lb 8.8 oz (76 kg) Height:  5' 4"  (162.6 cm)  BEHAVIORAL SYMPTOMS/MOOD NEUROLOGICAL BOWEL NUTRITION STATUS      Incontinent Feeding tube  AMBULATORY STATUS COMMUNICATION OF NEEDS Skin   Extensive Assist Verbally Surgical wounds                       Personal Care Assistance Level of Assistance  Bathing, Feeding, Dressing Bathing Assistance: Maximum assistance Feeding assistance: Maximum assistance Dressing Assistance: Maximum assistance     Functional Limitations Info  Sight, Hearing, Speech Sight Info: Adequate Hearing Info: Adequate Speech Info: Impaired    SPECIAL CARE FACTORS FREQUENCY  PT (By licensed PT), OT (By licensed OT)     PT Frequency: 5x week OT Frequency: 5x week     Speech Therapy Frequency: 3-5x week      Contractures Contractures Info: Not present    Additional Factors Info  Code Status, Allergies Code Status Info: DNR Allergies Info: Glipizide Er, percocet           Current Medications (10/16/2019):  This is the current hospital active medication list Current Facility-Administered Medications  Medication Dose Route Frequency Provider Last Rate Last Admin  . albuterol (PROVENTIL) (2.5 MG/3ML) 0.083% nebulizer solution 3 mL  3 mL Inhalation Q6H PRN Fritzi Mandes, MD      . amLODipine (NORVASC) tablet 5 mg  5 mg Per Tube Daily Fritzi Mandes, MD   5 mg at 10/15/19 1008  . atorvastatin (LIPITOR) tablet 40 mg  40 mg Per Tube QHS Fritzi Mandes, MD   40 mg at 10/15/19 2341  . B-complex with vitamin C tablet 1 tablet  1 tablet Per Tube QHS Fritzi Mandes, MD   1 tablet at 10/15/19 2340  . calcium acetate (Phos Binder)  (PHOSLYRA) 667 MG/5ML oral solution 1,334 mg  1,334 mg Per Tube TID with meals Fritzi Mandes, MD   1,334 mg at 10/15/19 1738  . calcium carbonate (TUMS - dosed in mg elemental calcium) chewable tablet 200 mg of elemental calcium  1 tablet Per Tube QHS Fritzi Mandes, MD      . Chlorhexidine Gluconate Cloth 2 % PADS 6 each  6 each Topical Q0600 Elwyn Reach, MD   6 each at 10/15/19 1011  . Chlorhexidine Gluconate Cloth 2 % PADS 6 each  6 each Topical Q0600 Liana Gerold, MD   6 each at 10/16/19 [phone]  . cholecalciferol (VITAMIN D3) tablet 1,000 Units  1,000 Units Oral Daily Fritzi Mandes, MD   1,000 Units at 10/15/19 1008  . epoetin alfa (EPOGEN) injection 4,000 Units  4,000 Units Subcutaneous Q M,W,F-HD Bhutani, Manpreet S, MD      . feeding supplement (NEPRO CARB STEADY) liquid 1,000 mL  1,000 mL Per Tube Q24H Fritzi Mandes, MD 40 mL/hr at 10/16/19 0015 1,000 mL at 10/16/19 0015  . feeding supplement (PRO-STAT SUGAR FREE 64) liquid 30 mL  30 mL Per Tube BID Fritzi Mandes, MD  30 mL at 10/15/19 2200  . ferrous sulfate 220 (44 Fe) MG/5ML solution 324 mg  324 mg Per Tube QHS Fritzi Mandes, MD   324 mg at 10/15/19 2341  . free water 75 mL  75 mL Per Tube Q6H Fritzi Mandes, MD   75 mL at 10/16/19 0600  . gabapentin (NEURONTIN) capsule 600 mg  600 mg Per Tube TID Fritzi Mandes, MD   600 mg at 10/15/19 2340  . heparin injection 1,000 Units  1,000 Units Dialysis PRN Liana Gerold, MD      . heparin injection 5,000 Units  5,000 Units Subcutaneous Q8H Elwyn Reach, MD   5,000 Units at 10/16/19 0543  . insulin aspart (novoLOG) injection 0-5 Units  0-5 Units Subcutaneous QHS Garba, Mohammad L, MD      . insulin aspart (novoLOG) injection 0-6 Units  0-6 Units Subcutaneous TID WC Garba, Mohammad L, MD      . lacosamide (VIMPAT) tablet 100 mg  100 mg Per Tube BID Fritzi Mandes, MD   100 mg at 10/15/19 2341  . lidocaine (PF) (XYLOCAINE) 1 % injection 5 mL  5 mL Intradermal PRN Bhutani, Manpreet S, MD      .  lidocaine-prilocaine (EMLA) cream 1 application  1 application Topical PRN Bhutani, Manpreet S, MD      . liver oil-zinc oxide (DESITIN) 40 % ointment   Topical PRN Fritzi Mandes, MD      . MEDLINE mouth rinse  15 mL Mouth Rinse BID Gala Romney L, MD   15 mL at 10/16/19 0410  . multivitamin (RENA-VIT) tablet 1 tablet  1 tablet Per Tube QHS Fritzi Mandes, MD   1 tablet at 10/15/19 2341  . mupirocin ointment (BACTROBAN) 2 % 1 application  1 application Nasal BID Elwyn Reach, MD   1 application at 51/10/21 1016  . ondansetron (ZOFRAN) tablet 4 mg  4 mg Oral Q6H PRN Elwyn Reach, MD       Or  . ondansetron (ZOFRAN) injection 4 mg  4 mg Intravenous Q6H PRN Gala Romney L, MD      . pantoprazole sodium (PROTONIX) 40 mg/20 mL oral suspension 40 mg  40 mg Oral QAC breakfast Fritzi Mandes, MD      . sodium chloride 0.9 % bolus 500 mL  500 mL Intravenous Once Harvest Dark, MD   Stopped at 10/13/19 2110  . traMADol (ULTRAM) tablet 50 mg  50 mg Oral Q12H PRN Elwyn Reach, MD   50 mg at 10/14/19 1904     Discharge Medications: Please see discharge summary for a list of discharge medications.  Relevant Imaging Results:  Relevant Lab Results:   Additional Information SSN; [id number]  Chang Tiggs, Gardiner Rhyme, LCSW

## 2019-10-16 NOTE — Progress Notes (Signed)
Writer spoke with pharmacy prior to patient going to have HD about epogen injection not being on the unit to give. Pharmacy stated epogen was to be given when patient was in HD.

## 2019-10-16 NOTE — Progress Notes (Signed)
Hemodialysis patient known at [facility] MWF 6:00.

## 2019-10-16 NOTE — Progress Notes (Signed)
This note also relates to the following rows which could not be included: Pulse Rate - Cannot attach notes to unvalidated device data Resp - Cannot attach notes to unvalidated device data BP - Cannot attach notes to unvalidated device data  Patient unable to keep access arm in place, I have decreased bfr for patient safety

## 2019-10-16 NOTE — Progress Notes (Signed)
  Subjective: 2 Days Post-Op Procedure(s) (LRB): CLOSED REDUCTION HIP (Left) Patient sleeping peacefully in bed as I enter the room.  Objective: Vital signs in last 24 hours: Temp:  [97.8 F (36.6 C)-98.3 F (36.8 C)] 98.3 F (36.8 C) (03/28 2320) Pulse Rate:  [76-78] 76 (03/28 2320) Resp:  [16-20] 16 (03/28 2320) BP: (123-137)/(51-55) 137/53 (03/28 2320) SpO2:  [98 %-99 %] 99 % (03/28 2320)  Intake/Output from previous day:  Intake/Output Summary (Last 24 hours) at 10/16/2019 0720 Last data filed at 10/16/2019 0042 Gross per 24 hour  Intake 728.67 ml  Output 3 ml  Net 725.67 ml    Intake/Output this shift: No intake/output data recorded.  Labs: Recent Labs    10/13/19 2040 10/14/19 0938  HGB 9.0* 9.2*   Recent Labs    10/13/19 2040 10/14/19 0938  WBC 11.0* 9.3  RBC 3.06* 3.14*  HCT 28.5* 29.0*  PLT 241 234   Recent Labs    10/13/19 2040 10/14/19 0938  NA 139 138  K 4.9 4.4  CL 94* 94*  CO2 25 23  BUN 145* 156*  CREATININE 12.22* 13.39*  GLUCOSE 116* 107*  CALCIUM 9.4 9.1   Recent Labs    10/13/19 2040  INR 1.0     EXAM General - Patient is asleep Extremity - no noted leg length discrepancy, no abnormal rotation of the leg; knee immobilizer remains in place    Assessment/Plan: 2 Days Post-Op Procedure(s) (LRB): CLOSED REDUCTION HIP (Left) Principal Problem:   Anterior dislocation of left hip (HCC) Active Problems:   COPD (chronic obstructive pulmonary disease) (HCC)   Controlled type 2 diabetes mellitus with stage 4 chronic kidney disease, with long-term current use of insulin (HCC)   Essential hypertension   CAD in native artery   Weakness generalized   Protein-calorie malnutrition, severe   ESRD (end stage renal disease) (HCC)   Chronic diastolic CHF (congestive heart failure) (HCC)   Pressure injury of skin  Estimated body mass index is 28.76 kg/m as calculated from the following:   Height as of this encounter: 5' 4"  (1.626 m).   Weight as of this encounter: 76 kg. Up with therapy Maintain posterior hip precautions.   Foot flat touchdown WB to left leg wearing knee immobilizer  Reche Dixon, PA-C [facility]edic [facility] 10/16/2019, 7:20 AM

## 2019-10-16 NOTE — TOC Progression Note (Signed)
Transition of Care [facility]) - Progression Note    Patient Details  Name: Barbara [name] MRN: [id number] Date of Birth: 20-Jul-1941  [facility] CM/SW Contact  Carigan Lister, Gardiner Rhyme, LCSW Phone Number: 10/16/2019, 8:54 AM  Clinical Narrative:  [facility] have asked for additional clinical information for the auth. Have faxed info to them will await auth. Pt to return to [facility] once auth gotten          Expected Discharge Plan and Services                                                 Social Determinants of Health (SDOH) Interventions    Readmission Risk Interventions Readmission Risk Prevention Plan 09/29/2019  Transportation Screening Complete  Medication Review (RN Care Manager) Complete  Palliative Care Screening Not Applicable  Some recent data might be hidden

## 2019-10-16 NOTE — Progress Notes (Addendum)
PROGRESS NOTE                                                                                                                                                                                                             Patient Demographics:    Barbara [name], is a 79 y.o. female, DOB - 05-30-1941, IHK:[id number]  Admit date - 10/13/2019   Admitting Physician Elwyn Reach, MD  Outpatient Primary MD for the patient is [name], MD  LOS - 3  Outpatient Specialists: Nephrology  Chief Complaint  Patient presents with  . Fall  . Hip Pain       Brief Narrative 79 year old female with diabetes mellitus, CAD, ESRD on hemodialysis (T, TH, SAT), COPD with chronic bronchitis (oxygen dependent), on tube feed, osteoarthritis with recent left total hip replacement 5 weeks back had a fall at the skilled nursing facility and sustained a periprosthetic dislocation.  Underwent closed reduction of the hip.   Subjective:   Reports pain over her left hip to be stable.   Assessment  & Plan :    Principal Problem:   Anterior dislocation of left hip (HCC) Posterior hip dislocation following mechanical fall.  S/p closed reduction on 3/27. Pain control with as needed tramadol and bowel regimen.  PT recommends SNF.  (Touchdown weightbearing on left leg with knee immobilizer).  Follow-up with orthopedics as outpatient in 2 weeks.  Will confirm outpatient DVT prophylaxis with orthopedics.  Active Problems:   COPD (chronic obstructive pulmonary disease) (HCC) Chronic hypoxic respiratory failure On 2 L via nasal cannula.  Stable.  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  History of seizure disorder Recent work-up done for encephalopathy and cognitive decline were negative.  Recent MRI unremarkable.  Recent EEG showed  diffuse slowing consistent with encephalopathy.  Continue Vimpat.  End-stage renal disease On hemodialysis T, TH, SAT.  Receiving hemodialysis while in the hospital.  Coronary artery disease Stable.  Continue statin.  Essential hypertension Stable.  Continue amlodipine   Pressure injury to the skin (stage II sacral pressure ulcer) Care per nursing.   Code Status : DNR  Family Communication  : None at bedside  Disposition Plan  : Return to SNF  Barriers For Discharge : Pending clinical improvement  and insurance authorization  Consults  : Orthopedics  Procedures  : Left hip reduction  DVT Prophylaxis  : Subcu heparin  Lab Results  Component Value Date   PLT 168 10/16/2019    Antibiotics  :  Anti-infectives (From admission, onward)   None        Objective:   Vitals:   10/15/19 0800 10/15/19 1611 10/15/19 2320 10/16/19 0800  BP: (!) 130/55 (!) 123/51 (!) 137/53 (!) 122/49  Pulse: 78 78 76 72  Resp: 20 18 16 18   Temp: 98.3 F (36.8 C) 97.8 F (36.6 C) 98.3 F (36.8 C) 97.8 F (36.6 C)  TempSrc: Oral Oral  Oral  SpO2: 99% 98% 99% 100%  Weight:      Height:        Wt Readings from Last 3 Encounters:  10/13/19 76 kg  10/11/19 76 kg  10/10/19 76.4 kg     Intake/Output Summary (Last 24 hours) at 10/16/2019 1428 Last data filed at 10/16/2019 1412 Gross per 24 hour  Intake 728.67 ml  Output 3 ml  Net 725.67 ml     Physical Exam  Gen: not in distress HEENT:  moist mucosa, supple neck Chest: clear b/l, no added sounds CVS: N S1&S2, no murmurs, GI: soft, NT, ND, G-tube in place Musculoskeletal: warm, no edema, clean dressing over left hip     Data Review:    CBC Recent Labs  Lab 10/10/19 1054 10/11/19 0843 10/13/19 2040 10/14/19 0938 10/16/19 1209  WBC 16.0* 14.4* 11.0* 9.3 7.7  HGB 9.9* 9.1* 9.0* 9.2* 8.0*  HCT 30.7* 28.3* 28.5* 29.0* 25.6*  PLT 267 281 241 234 168  MCV 90.6 91.9 93.1 92.4 94.1  MCH 29.2 29.5 29.4 29.3 29.4  MCHC  32.2 32.2 31.6 31.7 31.3  RDW 16.8* 17.3* 17.6* 17.7* 17.3*  LYMPHSABS  --  1.0  --   --   --   MONOABS  --  0.8  --   --   --   EOSABS  --  0.3  --   --   --   BASOSABS  --  0.0  --   --   --     Chemistries  Recent Labs  Lab 10/10/19 1054 10/11/19 0843 10/13/19 2040 10/14/19 0938 10/16/19 1209  NA 140 140 139 138  --   K 3.7 3.3* 4.9 4.4  --   CL 96* 100 94* 94*  --   CO2 27 25 25 23   --   GLUCOSE 134* 136* 116* 107*  --   BUN 104* 112* 145* 156*  --   CREATININE 8.54* 8.00* 12.22* 13.39* 9.89*  CALCIUM 8.7* 8.8* 9.4 9.1  --   AST  --  17 21 16   --   ALT  --  17 10 13   --   ALKPHOS  --  76 86 82  --   BILITOT  --  0.7 0.6 0.6  --    ------------------------------------------------------------------------------------------------------------------ No results for input(s): CHOL, HDL, LDLCALC, TRIG, CHOLHDL, LDLDIRECT in the last 72 hours.  Lab Results  Component Value Date   HGBA1C 5.7 (H) 10/03/2019   ------------------------------------------------------------------------------------------------------------------ No results for input(s): TSH, T4TOTAL, T3FREE, THYROIDAB in the last 72 hours.  Invalid input(s): FREET3 ------------------------------------------------------------------------------------------------------------------ No results for input(s): VITAMINB12, FOLATE, FERRITIN, TIBC, IRON, RETICCTPCT in the last 72 hours.  Coagulation profile Recent Labs  Lab 10/13/19 2040  INR 1.0    No results for input(s): DDIMER in the last 72 hours.  Cardiac  Enzymes No results for input(s): CKMB, TROPONINI, MYOGLOBIN in the last 168 hours.  Invalid input(s): CK ------------------------------------------------------------------------------------------------------------------ No results found for: BNP  Inpatient Medications  Scheduled Meds: . amLODipine  5 mg Per Tube Daily  . atorvastatin  40 mg Per Tube QHS  . B-complex with vitamin C  1 tablet Per Tube QHS   . calcium acetate (Phos Binder)  1,334 mg Per Tube TID with meals  . calcium carbonate  1 tablet Per Tube QHS  . Chlorhexidine Gluconate Cloth  6 each Topical Q0600  . Chlorhexidine Gluconate Cloth  6 each Topical Q0600  . cholecalciferol  1,000 Units Oral Daily  . docusate sodium  100 mg Oral BID  . epoetin (EPOGEN/PROCRIT) injection  4,000 Units Subcutaneous Q M,W,F-HD  . feeding supplement (NEPRO CARB STEADY)  1,000 mL Per Tube Q24H  . feeding supplement (PRO-STAT SUGAR FREE 64)  30 mL Per Tube BID  . ferrous sulfate  324 mg Per Tube QHS  . free water  75 mL Per Tube Q6H  . gabapentin  600 mg Per Tube TID  . heparin  5,000 Units Subcutaneous Q8H  . insulin aspart  0-5 Units Subcutaneous QHS  . insulin aspart  0-6 Units Subcutaneous TID WC  . lacosamide  100 mg Per Tube BID  . mouth rinse  15 mL Mouth Rinse BID  . multivitamin  1 tablet Per Tube QHS  . mupirocin ointment  1 application Nasal BID  . pantoprazole sodium  40 mg Oral QAC breakfast   Continuous Infusions: . sodium chloride Stopped (10/13/19 2110)   PRN Meds:.albuterol, bisacodyl, heparin, lidocaine (PF), lidocaine-prilocaine, liver oil-zinc oxide, menthol-cetylpyridinium **OR** phenol, metoCLOPramide **OR** metoCLOPramide (REGLAN) injection, ondansetron **OR** ondansetron (ZOFRAN) IV, senna-docusate, traMADol  Micro Results Recent Results (from the past 240 hour(s))  Respiratory Panel by RT PCR (Flu A&B, Covid) - Nasopharyngeal Swab     Status: None   Collection Time: 10/10/19  1:20 PM   Specimen: Nasopharyngeal Swab  Result Value Ref Range Status   SARS Coronavirus 2 by RT PCR NEGATIVE NEGATIVE Final    Comment: (NOTE) SARS-CoV-2 target nucleic acids are NOT DETECTED. The SARS-CoV-2 RNA is generally detectable in upper respiratoy specimens during the acute phase of infection. The lowest concentration of SARS-CoV-2 viral copies this assay can detect is 131 copies/mL. A negative result does not preclude  SARS-Cov-2 infection and should not be used as the sole basis for treatment or other patient management decisions. A negative result may occur with  improper specimen collection/handling, submission of specimen other than nasopharyngeal swab, presence of viral mutation(s) within the areas targeted by this assay, and inadequate number of viral copies (<131 copies/mL). A negative result must be combined with clinical observations, patient history, and epidemiological information. The expected result is Negative. Fact Sheet for Patients:  PinkCheek.be Fact Sheet for Healthcare Providers:  GravelBags.it This test is not yet ap proved or cleared by the [place] FDA and  has been authorized for detection and/or diagnosis of SARS-CoV-2 by FDA under an Emergency Use Authorization (EUA). This EUA will remain  in effect (meaning this test can be used) for the duration of the COVID-19 declaration under Section 564(b)(1) of the Act, 21 U.S.C. section 360bbb-3(b)(1), unless the authorization is terminated or revoked sooner.    Influenza A by PCR NEGATIVE NEGATIVE Final   Influenza B by PCR NEGATIVE NEGATIVE Final    Comment: (NOTE) The Xpert Xpress SARS-CoV-2/FLU/RSV assay is intended as an aid in  the diagnosis  of influenza from Nasopharyngeal swab specimens and  should not be used as a sole basis for treatment. Nasal washings and  aspirates are unacceptable for Xpert Xpress SARS-CoV-2/FLU/RSV  testing. Fact Sheet for Patients: PinkCheek.be Fact Sheet for Healthcare Providers: GravelBags.it This test is not yet approved or cleared by the [place] FDA and  has been authorized for detection and/or diagnosis of SARS-CoV-2 by  FDA under an Emergency Use Authorization (EUA). This EUA will remain  in effect (meaning this test can be used) for the duration of the  Covid-19  declaration under Section 564(b)(1) of the Act, 21  U.S.C. section 360bbb-3(b)(1), unless the authorization is  terminated or revoked. Performed at [facility], [address] [place], [place] [address]   Respiratory Panel by RT PCR (Flu A&B, Covid) - Nasopharyngeal Swab     Status: None   Collection Time: 10/13/19 10:09 PM   Specimen: Nasopharyngeal Swab  Result Value Ref Range Status   SARS Coronavirus 2 by RT PCR NEGATIVE NEGATIVE Final    Comment: (NOTE) SARS-CoV-2 target nucleic acids are NOT DETECTED. The SARS-CoV-2 RNA is generally detectable in upper respiratoy specimens during the acute phase of infection. The lowest concentration of SARS-CoV-2 viral copies this assay can detect is 131 copies/mL. A negative result does not preclude SARS-Cov-2 infection and should not be used as the sole basis for treatment or other patient management decisions. A negative result may occur with  improper specimen collection/handling, submission of specimen other than nasopharyngeal swab, presence of viral mutation(s) within the areas targeted by this assay, and inadequate number of viral copies (<131 copies/mL). A negative result must be combined with clinical observations, patient history, and epidemiological information. The expected result is Negative. Fact Sheet for Patients:  PinkCheek.be Fact Sheet for Healthcare Providers:  GravelBags.it This test is not yet ap proved or cleared by the [place] FDA and  has been authorized for detection and/or diagnosis of SARS-CoV-2 by FDA under an Emergency Use Authorization (EUA). This EUA will remain  in effect (meaning this test can be used) for the duration of the COVID-19 declaration under Section 564(b)(1) of the Act, 21 U.S.C. section 360bbb-3(b)(1), unless the authorization is terminated or revoked sooner.    Influenza A by PCR NEGATIVE NEGATIVE Final   Influenza B  by PCR NEGATIVE NEGATIVE Final    Comment: (NOTE) The Xpert Xpress SARS-CoV-2/FLU/RSV assay is intended as an aid in  the diagnosis of influenza from Nasopharyngeal swab specimens and  should not be used as a sole basis for treatment. Nasal washings and  aspirates are unacceptable for Xpert Xpress SARS-CoV-2/FLU/RSV  testing. Fact Sheet for Patients: PinkCheek.be Fact Sheet for Healthcare Providers: GravelBags.it This test is not yet approved or cleared by the [place] FDA and  has been authorized for detection and/or diagnosis of SARS-CoV-2 by  FDA under an Emergency Use Authorization (EUA). This EUA will remain  in effect (meaning this test can be used) for the duration of the  Covid-19 declaration under Section 564(b)(1) of the Act, 21  U.S.C. section 360bbb-3(b)(1), unless the authorization is  terminated or revoked. Performed at [facility], [address] [place], [place] [address]   Surgical pcr screen     Status: Abnormal   Collection Time: 10/13/19 11:25 PM   Specimen: Nasal Mucosa; Nasal Swab  Result Value Ref Range Status   MRSA, PCR POSITIVE (A) NEGATIVE Final    Comment: RESULT CALLED TO, READ BACK BY AND VERIFIED WITH: TERESA  COBLE ON 10/14/19 AT 0147 [facility]    Staphylococcus aureus POSITIVE (A) NEGATIVE Final    Comment: (NOTE) The Xpert SA Assay (FDA approved for NASAL specimens in patients 30 years of age and older), is one component of a comprehensive surveillance program. It is not intended to diagnose infection nor to guide or monitor treatment. Performed at [facility], [address] [place], [place] [address]     Radiology Reports DG Chest 1 View  Result Date: 10/13/2019 CLINICAL DATA:  79 year old female with left hip and leg pain after a fall yesterday. EXAM: CHEST  1 VIEW COMPARISON:  Chest CTA 10/03/2019 and earlier. FINDINGS: Supine AP view at 1938 hours. The  patient is more rotated to the right. Chronic left innominate vein and axillary vascular stents. Stable mild cardiomegaly and mediastinal contours. Allowing for portable technique the lungs are clear. Visualized tracheal air column is within normal limits. No acute osseous abnormality identified. IMPRESSION: No acute cardiopulmonary abnormality. Electronically Signed   By: Genevie Ann M.D.   On: 10/13/2019 20:13   DG Chest 1 View  Result Date: 09/17/2019 CLINICAL DATA:  Feeding tube placement EXAM: CHEST  1 VIEW COMPARISON:  09/17/2019 FINDINGS: 0402 hours. The cardio pericardial silhouette is enlarged. There is pulmonary vascular congestion without overt pulmonary edema. Interstitial markings are diffusely coarsened with chronic features. Prominent skin fold noted over the left hemithorax. Feeding tube is coiled in the esophagus with the tip of the feeding tube at the esophagogastric junction. Telemetry leads overlie the chest. IMPRESSION: A relatively large length of the feeding tube is coiled in the esophagus with the tip at the esophagogastric junction. Given the large amount of redundant tubing in the esophagus, this should be pulled back with repeat placement. These results will be called to the ordering clinician or representative by the Radiologist Assistant, and communication documented in the PACS or zVision Dashboard. Electronically Signed   By: Misty Stanley M.D.   On: 09/17/2019 04:26   DG Chest 1 View  Result Date: 09/17/2019 CLINICAL DATA:  Enteric catheter placement, patient pulled on NG tube EXAM: CHEST  1 VIEW COMPARISON:  09/17/2019 FINDINGS: Single frontal view of the chest demonstrates a weighted tip of an enteric feeding catheter overlying the gastroesophageal junction. Cardiac silhouette is stable. Chronic central vascular congestion and diffuse interstitial prominence unchanged. Stable patchy consolidation at the lung bases. No effusion or pneumothorax. Vascular stent unchanged.  IMPRESSION: 1. Enteric catheter overlying gastroesophageal junction. 2. Stable vascular congestion and bibasilar consolidation. Electronically Signed   By: Randa Ngo M.D.   On: 09/17/2019 03:09   DG Chest 1 View  Result Date: 09/17/2019 CLINICAL DATA:  Enteric catheter placement, previous abnormal chest x-ray EXAM: CHEST  1 VIEW COMPARISON:  09/13/2019 FINDINGS: Frontal view of the chest was obtained, with the patient markedly rotated to the right. Weighted tip of an enteric catheter overlies the distal thoracic esophagus. The tip of the catheter is at least 4 cm from the gastroesophageal junction. The cardiac silhouette is enlarged but stable. There is persistent central vascular congestion. Patchy consolidation at the lung bases slightly improved. Evaluation of the right lung base limited due to patient rotation. No large effusion or pneumothorax. Stable left-sided vascular stent. IMPRESSION: 1. Enteric catheter as above. Recommended advancing at least 4 cm to ensure intragastric placement. 2. Central vascular congestion and patchy bibasilar consolidation, slightly improved since prior study. Evaluation limited by positioning. Electronically Signed   By: Randa Ngo M.D.   On: 09/17/2019  00:30   CT ANGIO CHEST PE W OR WO CONTRAST  Result Date: 10/03/2019 CLINICAL DATA:  Respiratory failure. EXAM: CT ANGIOGRAPHY CHEST WITH CONTRAST TECHNIQUE: Multidetector CT imaging of the chest was performed using the standard protocol during bolus administration of intravenous contrast. Multiplanar CT image reconstructions and MIPs were obtained to evaluate the vascular anatomy. CONTRAST:  33m OMNIPAQUE IOHEXOL 350 MG/ML SOLN COMPARISON:  August 24, 2019. FINDINGS: Cardiovascular: Satisfactory opacification of the pulmonary arteries to the segmental level. No evidence of pulmonary embolism. Mild cardiomegaly. Atherosclerosis of thoracic aorta is noted without aneurysm or dissection. Patent left brachiocephalic  vein stent is noted. No pericardial effusion. Mediastinum/Nodes: Small sliding-type hiatal hernia. No significant adenopathy is noted. Thyroid gland is unremarkable. Lungs/Pleura: Small left pleural effusion is noted. Mild bilateral posterior basilar subsegmental atelectasis is noted. Possible bilateral pulmonary edema is noted in both upper lobes. Upper Abdomen: No acute abnormality. Musculoskeletal: No chest wall abnormality. No acute or significant osseous findings. Review of the MIP images confirms the above findings. IMPRESSION: 1. No definite evidence of pulmonary embolus. 2. Small left pleural effusion is noted with mild bilateral posterior basilar subsegmental atelectasis. 3. Possible bilateral pulmonary edema is noted in both upper lobes. 4. Small sliding-type hiatal hernia. Aortic Atherosclerosis (ICD10-I70.0). Electronically Signed   By: JMarijo ConceptionM.D.   On: 10/03/2019 12:48   DG Pelvis Portable  Result Date: 10/14/2019 CLINICAL DATA:  Status post left hip reduction. EXAM: PORTABLE PELVIS 1-2 VIEWS COMPARISON:  Left hip radiographs dated 10/13/2019 FINDINGS: There has been interval reduction of a left hip dislocation. A left hip hemiarthroplasty is noted. No acute osseous injury is identified. IMPRESSION: Interval reduction of a left hip dislocation. No acute osseous injury identified. Electronically Signed   By: TZerita BoersM.D.   On: 10/14/2019 15:11   [place]Venous Img Lower Bilateral (DVT)  Result Date: 10/03/2019 CLINICAL DATA:  Bilateral lower extremity pain. History of malignancy. Evaluate for DVT. EXAM: BILATERAL LOWER EXTREMITY VENOUS DOPPLER ULTRASOUND TECHNIQUE: Gray-scale sonography with graded compression, as well as color Doppler and duplex ultrasound were performed to evaluate the lower extremity deep venous systems from the level of the common femoral vein and including the common femoral, femoral, profunda femoral, popliteal and calf veins including the posterior tibial,  peroneal and gastrocnemius veins when visible. The superficial great saphenous vein was also interrogated. Spectral Doppler was utilized to evaluate flow at rest and with distal augmentation maneuvers in the common femoral, femoral and popliteal veins. COMPARISON:  None. FINDINGS: RIGHT LOWER EXTREMITY Common Femoral Vein: No evidence of thrombus. Normal compressibility, respiratory phasicity and response to augmentation. Saphenofemoral Junction: No evidence of thrombus. Normal compressibility and flow on color Doppler imaging. Profunda Femoral Vein: No evidence of thrombus. Normal compressibility and flow on color Doppler imaging. Femoral Vein: No evidence of thrombus. Normal compressibility, respiratory phasicity and response to augmentation. Popliteal Vein: No evidence of thrombus. Normal compressibility, respiratory phasicity and response to augmentation. Calf Veins: No evidence of thrombus. Normal compressibility and flow on color Doppler imaging. Superficial Great Saphenous Vein: No evidence of thrombus. Normal compressibility. Venous Reflux:  None. Other Findings:  None. LEFT LOWER EXTREMITY Common Femoral Vein: No evidence of thrombus. Normal compressibility, respiratory phasicity and response to augmentation. Saphenofemoral Junction: No evidence of thrombus. Normal compressibility and flow on color Doppler imaging. Profunda Femoral Vein: No evidence of thrombus. Normal compressibility and flow on color Doppler imaging. Femoral Vein: No evidence of thrombus. Normal compressibility, respiratory phasicity and response to augmentation. Popliteal  Vein: No evidence of thrombus. Normal compressibility, respiratory phasicity and response to augmentation. Calf Veins: No evidence of thrombus. Normal compressibility and flow on color Doppler imaging. Superficial Great Saphenous Vein: No evidence of thrombus. Normal compressibility. Venous Reflux:  None. Other Findings:  None. IMPRESSION: No evidence of DVT within  either lower extremity. Electronically Signed   By: Sandi Mariscal M.D.   On: 10/03/2019 11:35   DG ABDOMEN PEG TUBE LOCATION  Result Date: 09/28/2019 CLINICAL DATA:  Peg tube location. EXAM: ABDOMEN - 1 VIEW COMPARISON:  09/13/2019 FINDINGS: A gastrostomy tube projects over the gastric bubble. The bowel gas pattern is nonobstructive. Extensive phleboliths project over the patient's pelvis. The patient is status post prior total hip arthroplasty on the left. IMPRESSION: Gastrostomy tube projects over the stomach. Electronically Signed   By: Constance Holster M.D.   On: 09/28/2019 01:41   DG Chest Port 1 View  Result Date: 10/02/2019 CLINICAL DATA:  Shortness of breath. Concern for GI bleed. EXAM: PORTABLE CHEST 1 VIEW COMPARISON:  Radiograph yesterday, multiple prior exams. CT 08/24/2019 FINDINGS: Stable cardiomegaly. Unchanged mediastinal contours. Vascular stent in the region of the left subclavian unchanged. Unchanged elevation of left hemidiaphragm. Diffuse interstitial thickening consistent with pulmonary edema, unchanged. Possible trace pleural effusions. Subsegmental opacities at the left lung base. No pneumothorax. Patient's chin obscures the apices. Vascular stent in the left axilla and upper arm, partially included. IMPRESSION: Unchanged cardiomegaly and pulmonary edema since yesterday. Possible trace pleural effusions. Electronically Signed   By: Keith Rake M.D.   On: 10/02/2019 19:46   DG Chest Port 1 View  Result Date: 10/01/2019 CLINICAL DATA:  Short of breath. EXAM: PORTABLE CHEST 1 VIEW COMPARISON:  09/28/2019 FINDINGS: Cardiac silhouette borderline enlarged. No mediastinal or hilar masses. Left sided stent, which appears to extend from the brachiocephalic to the proximal left subclavian vein, stable. Lungs demonstrate vascular congestion, interstitial thickening and mild hazy opacity at the bases, left greater than right. Suspect small pleural effusions. No pneumothorax. Skeletal  structures are grossly intact. IMPRESSION: 1. Findings consistent with interstitial pulmonary edema with small pleural effusions. Electronically Signed   By: Lajean Manes M.D.   On: 10/01/2019 12:48   DG Chest Port 1 View  Result Date: 09/28/2019 CLINICAL DATA:  Central line clogged, no blood return. Concern for line placement in lung. EXAM: PORTABLE CHEST 1 VIEW COMPARISON:  Chest x-ray from earlier same day FINDINGS: RIGHT IJ line is stable in position with tip projected over the RIGHT lung apex, possibly within a small branch of the brachiocephalic or subclavian vein. Or a heart size and mediastinal contours are stable. Stable blunting of the LEFT costophrenic angle, atelectasis versus small pleural effusion. No new lung findings. No pneumothorax is seen. IMPRESSION: 1. Stable position of the RIGHT IJ line with tip projected over the RIGHT lung apex, possibly within a small branch of the brachiocephalic or subclavian vein. 2. No pneumothorax seen. 3. Probable atelectasis and/or small pleural effusion at the LEFT lung base. Electronically Signed   By: Franki Cabot M.D.   On: 09/28/2019 15:14   DG Chest Port 1 View  Result Date: 09/28/2019 CLINICAL DATA:  Central line placement EXAM: PORTABLE CHEST 1 VIEW COMPARISON:  September 22, 2019 FINDINGS: There is a right-sided central venous catheter with tip projecting over the right lung apex. This may be in the region of the distal right internal jugular vein. Exact positioning is difficult to determine on this study. A stent projects over the patient's upper  mediastinum. The heart size is stable. There is stable blunting of the left costophrenic angle. There is no pneumothorax. IMPRESSION: 1. Right-sided central venous catheter as above.  No pneumothorax. 2. Relatively stable appearance of the chest when compared to prior x-ray. Electronically Signed   By: Constance Holster M.D.   On: 09/28/2019 01:40   DG Chest Port 1 View  Result Date: 09/22/2019 CLINICAL  DATA:  79 year old female with history of fever. EXAM: PORTABLE CHEST 1 VIEW COMPARISON:  Chest x-ray 09/17/2019. FINDINGS: Previously noted feeding tube has been removed. Vascular stents projecting over the left subclavian and left axillary regions. Lung volumes are low. No consolidative airspace disease. No pleural effusions. No suspicious appearing pulmonary nodules or masses are noted. No pneumothorax. No evidence of pulmonary edema. Mild cardiomegaly. Upper mediastinal contours are within normal limits. Aortic atherosclerosis. IMPRESSION: 1. Postoperative changes and support apparatus, as above. 2. Low lung volumes without radiographic evidence of acute cardiopulmonary disease. 3. Mild cardiomegaly. 4. Aortic atherosclerosis. Electronically Signed   By: Vinnie Langton M.D.   On: 09/22/2019 14:14   DG HIP OPERATIVE UNILAT W OR W/O PELVIS LEFT  Result Date: 10/14/2019 CLINICAL DATA:  Close reduction left hip EXAM: OPERATIVE left HIP (WITH PELVIS IF PERFORMED) 1 VIEWS TECHNIQUE: Fluoroscopic spot image(s) were submitted for interpretation post-operatively. COMPARISON:  10/13/2018 FINDINGS: Single fluoroscopic images obtained during the performance of the procedure and is provided for interpretation only. Because of overlying structures, the image is nondiagnostic. Please refer to the operative report. FLUOROSCOPY TIME:  2 seconds IMPRESSION: 1. Intraoperative assistance as above. Electronically Signed   By: Randa Ngo M.D.   On: 10/14/2019 15:09   DG HIP UNILAT WITH PELVIS 2-3 VIEWS LEFT  Result Date: 10/13/2019 CLINICAL DATA:  79 year old female with left hip and leg pain after a fall yesterday. EXAM: DG HIP (WITH OR WITHOUT PELVIS) 2-3V LEFT COMPARISON:  Postoperative left hip radiographs 09/05/2019. 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 The femoral head component appears mildly impacted on the acetabular component, but no bone or hardware fracture is  identified. Pubic rami appear stable. No pelvis fracture identified. Grossly intact proximal right femur. Pelvic and proximal lower extremity vascular calcifications. Negative visible bowel gas pattern. IMPRESSION: Superiorly dislocated left hip arthroplasty. No fracture identified. Electronically Signed   By: Genevie Ann M.D.   On: 10/13/2019 20:11    Time Spent in minutes  25   Orlando Devereux M.D on 10/16/2019 at 2:28 PM  Between 7am to 7pm - Pager - [phone]  After 7pm go to [email or web address] - password [facility]  [facility] -  Office  [phone]

## 2019-10-16 NOTE — Plan of Care (Signed)

## 2019-10-17 LAB — GLUCOSE, CAPILLARY
Glucose-Capillary: 111 mg/dL — ABNORMAL HIGH (ref 70–99)
Glucose-Capillary: 115 mg/dL — ABNORMAL HIGH (ref 70–99)
Glucose-Capillary: 106 mg/dL — ABNORMAL HIGH (ref 70–99)

## 2019-10-17 MED ORDER — TRAMADOL HCL 50 MG PO TABS: 50.0000 mg | ORAL_TABLET | Freq: Two times a day (BID) | ORAL | 0 refills | Status: DC | PRN

## 2019-10-17 MED ORDER — DOCUSATE SODIUM 100 MG PO CAPS: 100.0000 mg | ORAL_CAPSULE | Freq: Every day | ORAL | 0 refills | Status: DC | PRN

## 2019-10-17 NOTE — Discharge Summary (Addendum)
Physician Discharge Summary  Barbara [name] OAC:[id number] DOB: 1940/11/21 DOA: 10/13/2019  PCP: [name], MD  Admit date: 10/13/2019 Discharge date: 10/17/2019  Admitted From: Skilled nursing facility Disposition: Skilled nursing facility  Recommendations for Outpatient Follow-up:  Follow-up with MD at SNF in 1 week Follow-up with orthopedic surgery (Dr. Posey Pronto) in 2 weeks  Weightbearing recommendations: Touchdown weightbearing with knee immobilizer until seen by orthopedics in 2 weeks.  Equipment/Devices: 2 L O2 via nasal cannula (chronic)  Discharge Condition: Fair CODE STATUS: DNR Diet recommendation: G-tube feeding (Nepro carb steady liquid) @ 40 mm/h continuously along with protein supplement.    Discharge Diagnoses:  Principal Problem:    Anterior dislocation of left hip (HCC)  Active Problems:   COPD (chronic obstructive pulmonary disease) (HCC) Chronic respiratory failure with hypoxia   Controlled type 2 diabetes mellitus with stage 4 chronic kidney disease, without long-term current use of insulin (HCC)   Essential hypertension   CAD in native artery   Weakness generalized   Protein-calorie malnutrition, severe   ESRD (end stage renal disease) (HCC)   Chronic diastolic CHF (congestive heart failure) (HCC)   Pressure injury of skin  Brief narrative/HPI 79 year old female with diabetes mellitus, CAD, ESRD on hemodialysis (M, W, F), COPD with chronic respiratory failure on 2 L oxygen, on tube feeds, osteoarthritis with recent left total hip replacement 5 weeks back had a fall at the skilled nursing facility and sustained a periprosthetic dislocation.  Underwent closed reduction of the hip.    Hospital course   Principal Problem:   Anterior dislocation of left hip (HCC) Posterior hip dislocation following mechanical fall.  S/p closed reduction on 3/27. Pain control with as needed tramadol and bowel regimen.  PT recommends SNF.  (Touchdown weightbearing on left  leg with knee immobilizer).  Follow-up with orthopedics as outpatient in 2 weeks.    Has already received 6 weeks of DVT prophylaxis so does not need further prophylaxis.  (Confirmed with orthopedics).  Active Problems:   COPD (chronic obstructive pulmonary disease) (HCC) Chronic hypoxic respiratory failure On 2 L via nasal cannula.  Stable.  Continue as needed inhaler  Diabetes mellitus type 2, controlled  with peripheral neuropathy and end-stage renal disease Stable on sliding scale coverage.  History of seizure disorder Recent work-up done for encephalopathy and cognitive decline were negative.  Recent MRI unremarkable.  Recent EEG showed diffuse slowing consistent with encephalopathy.  Continue Vimpat.  End-stage renal disease On hemodialysis M, W, F.    Received hemodialysis while in the hospital.  Continue calcium acetate, calcium carbonate and vitamin D.  Anemia of chronic kidney disease epotein with dialysis.  Coronary artery disease Stable.  Continue statin.  Essential hypertension Stable.  Continue amlodipine   Pressure injury to the skin (stage II sacral pressure ulcer) Care per nursing.   Family Communication  : None at bedside  Disposition Plan  :  SNF Consults  : Orthopedics  Procedures  : Left hip reduction  Discharge Instructions   Allergies as of 10/17/2019      Reactions   Glipizide Er Other (See Comments)   Hypoglycemia   Percocet [oxycodone-acetaminophen] Hives      Medication List    TAKE these medications   Accu-Chek Aviva Plus w/Device Kit (USE TO MONITOR BLOOD SUGARS.)   albuterol 108 (90 Base) MCG/ACT inhaler Commonly known as: VENTOLIN HFA Inhale 2 puffs into the lungs every 6 (six) hours as needed for wheezing or shortness of breath.   amLODipine 5 MG  tablet Commonly known as: NORVASC Place 1 tablet (5 mg total) into feeding tube daily.   atorvastatin 40 MG tablet Commonly known as: LIPITOR Place 40 mg into feeding  tube at bedtime.   B-complex with vitamin C tablet Place 1 tablet into feeding tube at bedtime.   Calcium Acetate 667 MG Tabs Place 1,334 mg into feeding tube 3 (three) times daily.   calcium carbonate 500 MG chewable tablet Commonly known as: TUMS - dosed in mg elemental calcium Place 1 tablet into feeding tube at bedtime.   cholecalciferol 1000 units tablet Commonly known as: VITAMIN D Take 1 tablet (1,000 Units total) by mouth daily.   docusate sodium 100 MG capsule Commonly known as: COLACE Take 1 capsule (100 mg total) by mouth daily as needed for mild constipation.   epoetin alfa 10000 UNIT/ML injection Commonly known as: EPOGEN Inject 1 mL (10,000 Units total) into the vein every Monday, Wednesday, and Friday with hemodialysis.   feeding supplement (PRO-STAT SUGAR FREE 64) Liqd Place 30 mLs into feeding tube 2 (two) times daily.   ferrous sulfate 300 (60 Fe) MG/5ML syrup Place 324 mg into feeding tube at bedtime.   gabapentin 300 MG capsule Commonly known as: NEURONTIN Place 2 capsules (600 mg total) into feeding tube 3 (three) times daily.   heparin 5000 UNIT/ML injection Inject 1 mL (5,000 Units total) into the skin every 12 (twelve) hours.   Lacosamide 100 MG Tabs Place 1 tablet (100 mg total) into feeding tube 2 (two) times daily.   metoCLOPramide 5 MG/5ML solution Commonly known as: REGLAN Place 5 mg into feeding tube every 8 (eight) hours.   multivitamin Tabs tablet Place 1 tablet into feeding tube at bedtime.   NexIUM 40 MG packet Generic drug: esomeprazole 40 mg daily before breakfast. Per tube   NON FORMULARY Place 1 Units into the nose at bedtime. CPAP Time of use 2100-0600   OXYGEN Inhale 2 L into the lungs at bedtime.   traMADol 50 MG tablet Commonly known as: ULTRAM Take 1 tablet (50 mg total) by mouth every 12 (twelve) hours as needed for moderate pain.      Follow-up Information    MD at SNF in 1 week Follow up.        Leim Fabry, MD Follow up in 2 week(s).   Specialty: Orthopedic Surgery Contact information: [place] [address] [phone]          Allergies  Allergen Reactions  . Glipizide Er Other (See Comments)    Hypoglycemia   . Percocet [Oxycodone-Acetaminophen] Hives      Procedures/Studies: DG Chest 1 View  Result Date: 10/13/2019 CLINICAL DATA:  79 year old female with left hip and leg pain after a fall yesterday. EXAM: CHEST  1 VIEW COMPARISON:  Chest CTA 10/03/2019 and earlier. FINDINGS: Supine AP view at 1938 hours. The patient is more rotated to the right. Chronic left innominate vein and axillary vascular stents. Stable mild cardiomegaly and mediastinal contours. Allowing for portable technique the lungs are clear. Visualized tracheal air column is within normal limits. No acute osseous abnormality identified. IMPRESSION: No acute cardiopulmonary abnormality. Electronically Signed   By: Genevie Ann M.D.   On: 10/13/2019 20:13   CT ANGIO CHEST PE W OR WO CONTRAST  Result Date: 10/03/2019 CLINICAL DATA:  Respiratory failure. EXAM: CT ANGIOGRAPHY CHEST WITH CONTRAST TECHNIQUE: Multidetector CT imaging of the chest was performed using the standard protocol during bolus administration of intravenous contrast. Multiplanar CT  image reconstructions and MIPs were obtained to evaluate the vascular anatomy. CONTRAST:  23m OMNIPAQUE IOHEXOL 350 MG/ML SOLN COMPARISON:  August 24, 2019. FINDINGS: Cardiovascular: Satisfactory opacification of the pulmonary arteries to the segmental level. No evidence of pulmonary embolism. Mild cardiomegaly. Atherosclerosis of thoracic aorta is noted without aneurysm or dissection. Patent left brachiocephalic vein stent is noted. No pericardial effusion. Mediastinum/Nodes: Small sliding-type hiatal hernia. No significant adenopathy is noted. Thyroid gland is unremarkable. Lungs/Pleura: Small left pleural effusion is noted. Mild bilateral posterior  basilar subsegmental atelectasis is noted. Possible bilateral pulmonary edema is noted in both upper lobes. Upper Abdomen: No acute abnormality. Musculoskeletal: No chest wall abnormality. No acute or significant osseous findings. Review of the MIP images confirms the above findings. IMPRESSION: 1. No definite evidence of pulmonary embolus. 2. Small left pleural effusion is noted with mild bilateral posterior basilar subsegmental atelectasis. 3. Possible bilateral pulmonary edema is noted in both upper lobes. 4. Small sliding-type hiatal hernia. Aortic Atherosclerosis (ICD10-I70.0). Electronically Signed   By: JMarijo ConceptionM.D.   On: 10/03/2019 12:48   DG Pelvis Portable  Result Date: 10/14/2019 CLINICAL DATA:  Status post left hip reduction. EXAM: PORTABLE PELVIS 1-2 VIEWS COMPARISON:  Left hip radiographs dated 10/13/2019 FINDINGS: There has been interval reduction of a left hip dislocation. A left hip hemiarthroplasty is noted. No acute osseous injury is identified. IMPRESSION: Interval reduction of a left hip dislocation. No acute osseous injury identified. Electronically Signed   By: TZerita BoersM.D.   On: 10/14/2019 15:11   UKoreaVenous Img Lower Bilateral (DVT)  Result Date: 10/03/2019 CLINICAL DATA:  Bilateral lower extremity pain. History of malignancy. Evaluate for DVT. EXAM: BILATERAL LOWER EXTREMITY VENOUS DOPPLER ULTRASOUND TECHNIQUE: Gray-scale sonography with graded compression, as well as color Doppler and duplex ultrasound were performed to evaluate the lower extremity deep venous systems from the level of the common femoral vein and including the common femoral, femoral, profunda femoral, popliteal and calf veins including the posterior tibial, peroneal and gastrocnemius veins when visible. The superficial great saphenous vein was also interrogated. Spectral Doppler was utilized to evaluate flow at rest and with distal augmentation maneuvers in the common femoral, femoral and popliteal  veins. COMPARISON:  None. FINDINGS: RIGHT LOWER EXTREMITY Common Femoral Vein: No evidence of thrombus. Normal compressibility, respiratory phasicity and response to augmentation. Saphenofemoral Junction: No evidence of thrombus. Normal compressibility and flow on color Doppler imaging. Profunda Femoral Vein: No evidence of thrombus. Normal compressibility and flow on color Doppler imaging. Femoral Vein: No evidence of thrombus. Normal compressibility, respiratory phasicity and response to augmentation. Popliteal Vein: No evidence of thrombus. Normal compressibility, respiratory phasicity and response to augmentation. Calf Veins: No evidence of thrombus. Normal compressibility and flow on color Doppler imaging. Superficial Great Saphenous Vein: No evidence of thrombus. Normal compressibility. Venous Reflux:  None. Other Findings:  None. LEFT LOWER EXTREMITY Common Femoral Vein: No evidence of thrombus. Normal compressibility, respiratory phasicity and response to augmentation. Saphenofemoral Junction: No evidence of thrombus. Normal compressibility and flow on color Doppler imaging. Profunda Femoral Vein: No evidence of thrombus. Normal compressibility and flow on color Doppler imaging. Femoral Vein: No evidence of thrombus. Normal compressibility, respiratory phasicity and response to augmentation. Popliteal Vein: No evidence of thrombus. Normal compressibility, respiratory phasicity and response to augmentation. Calf Veins: No evidence of thrombus. Normal compressibility and flow on color Doppler imaging. Superficial Great Saphenous Vein: No evidence of thrombus. Normal compressibility. Venous Reflux:  None. Other Findings:  None. IMPRESSION: No  evidence of DVT within either lower extremity. Electronically Signed   By: Sandi Mariscal M.D.   On: 10/03/2019 11:35   DG ABDOMEN PEG TUBE LOCATION  Result Date: 09/28/2019 CLINICAL DATA:  Peg tube location. EXAM: ABDOMEN - 1 VIEW COMPARISON:  09/13/2019 FINDINGS: A  gastrostomy tube projects over the gastric bubble. The bowel gas pattern is nonobstructive. Extensive phleboliths project over the patient's pelvis. The patient is status post prior total hip arthroplasty on the left. IMPRESSION: Gastrostomy tube projects over the stomach. Electronically Signed   By: Constance Holster M.D.   On: 09/28/2019 01:41   DG Chest Port 1 View  Result Date: 10/02/2019 CLINICAL DATA:  Shortness of breath. Concern for GI bleed. EXAM: PORTABLE CHEST 1 VIEW COMPARISON:  Radiograph yesterday, multiple prior exams. CT 08/24/2019 FINDINGS: Stable cardiomegaly. Unchanged mediastinal contours. Vascular stent in the region of the left subclavian unchanged. Unchanged elevation of left hemidiaphragm. Diffuse interstitial thickening consistent with pulmonary edema, unchanged. Possible trace pleural effusions. Subsegmental opacities at the left lung base. No pneumothorax. Patient's chin obscures the apices. Vascular stent in the left axilla and upper arm, partially included. IMPRESSION: Unchanged cardiomegaly and pulmonary edema since yesterday. Possible trace pleural effusions. Electronically Signed   By: Keith Rake M.D.   On: 10/02/2019 19:46   DG Chest Port 1 View  Result Date: 10/01/2019 CLINICAL DATA:  Short of breath. EXAM: PORTABLE CHEST 1 VIEW COMPARISON:  09/28/2019 FINDINGS: Cardiac silhouette borderline enlarged. No mediastinal or hilar masses. Left sided stent, which appears to extend from the brachiocephalic to the proximal left subclavian vein, stable. Lungs demonstrate vascular congestion, interstitial thickening and mild hazy opacity at the bases, left greater than right. Suspect small pleural effusions. No pneumothorax. Skeletal structures are grossly intact. IMPRESSION: 1. Findings consistent with interstitial pulmonary edema with small pleural effusions. Electronically Signed   By: Lajean Manes M.D.   On: 10/01/2019 12:48   DG Chest Port 1 View  Result Date:  09/28/2019 CLINICAL DATA:  Central line clogged, no blood return. Concern for line placement in lung. EXAM: PORTABLE CHEST 1 VIEW COMPARISON:  Chest x-ray from earlier same day FINDINGS: RIGHT IJ line is stable in position with tip projected over the RIGHT lung apex, possibly within a small branch of the brachiocephalic or subclavian vein. Or a heart size and mediastinal contours are stable. Stable blunting of the LEFT costophrenic angle, atelectasis versus small pleural effusion. No new lung findings. No pneumothorax is seen. IMPRESSION: 1. Stable position of the RIGHT IJ line with tip projected over the RIGHT lung apex, possibly within a small branch of the brachiocephalic or subclavian vein. 2. No pneumothorax seen. 3. Probable atelectasis and/or small pleural effusion at the LEFT lung base. Electronically Signed   By: Franki Cabot M.D.   On: 09/28/2019 15:14   DG Chest Port 1 View  Result Date: 09/28/2019 CLINICAL DATA:  Central line placement EXAM: PORTABLE CHEST 1 VIEW COMPARISON:  September 22, 2019 FINDINGS: There is a right-sided central venous catheter with tip projecting over the right lung apex. This may be in the region of the distal right internal jugular vein. Exact positioning is difficult to determine on this study. A stent projects over the patient's upper mediastinum. The heart size is stable. There is stable blunting of the left costophrenic angle. There is no pneumothorax. IMPRESSION: 1. Right-sided central venous catheter as above.  No pneumothorax. 2. Relatively stable appearance of the chest when compared to prior x-ray. Electronically Signed   By:  Constance Holster M.D.   On: 09/28/2019 01:40   DG Chest Port 1 View  Result Date: 09/22/2019 CLINICAL DATA:  79 year old female with history of fever. EXAM: PORTABLE CHEST 1 VIEW COMPARISON:  Chest x-ray 09/17/2019. FINDINGS: Previously noted feeding tube has been removed. Vascular stents projecting over the left subclavian and left axillary  regions. Lung volumes are low. No consolidative airspace disease. No pleural effusions. No suspicious appearing pulmonary nodules or masses are noted. No pneumothorax. No evidence of pulmonary edema. Mild cardiomegaly. Upper mediastinal contours are within normal limits. Aortic atherosclerosis. IMPRESSION: 1. Postoperative changes and support apparatus, as above. 2. Low lung volumes without radiographic evidence of acute cardiopulmonary disease. 3. Mild cardiomegaly. 4. Aortic atherosclerosis. Electronically Signed   By: Vinnie Langton M.D.   On: 09/22/2019 14:14   DG HIP OPERATIVE UNILAT W OR W/O PELVIS LEFT  Result Date: 10/14/2019 CLINICAL DATA:  Close reduction left hip EXAM: OPERATIVE left HIP (WITH PELVIS IF PERFORMED) 1 VIEWS TECHNIQUE: Fluoroscopic spot image(s) were submitted for interpretation post-operatively. COMPARISON:  10/13/2018 FINDINGS: Single fluoroscopic images obtained during the performance of the procedure and is provided for interpretation only. Because of overlying structures, the image is nondiagnostic. Please refer to the operative report. FLUOROSCOPY TIME:  2 seconds IMPRESSION: 1. Intraoperative assistance as above. Electronically Signed   By: Randa Ngo M.D.   On: 10/14/2019 15:09   DG HIP UNILAT WITH PELVIS 2-3 VIEWS LEFT  Result Date: 10/13/2019 CLINICAL DATA:  79 year old female with left hip and leg pain after a fall yesterday. EXAM: DG HIP (WITH OR WITHOUT PELVIS) 2-3V LEFT COMPARISON:  Postoperative left hip radiographs 09/05/2019. 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 The femoral head component appears mildly impacted on the acetabular component, but no bone or hardware fracture is identified. Pubic rami appear stable. No pelvis fracture identified. Grossly intact proximal right femur. Pelvic and proximal lower extremity vascular calcifications. Negative visible bowel gas pattern. IMPRESSION: Superiorly dislocated left hip  arthroplasty. No fracture identified. Electronically Signed   By: Genevie Ann M.D.   On: 10/13/2019 20:11       Subjective: No overnight events.  Received hemodialysis last evening.  Discharge Exam: Vitals:   10/17/19 0028 10/17/19 0804  BP:  (!) 109/47  Pulse: 74 72  Resp:  18  Temp: 98 F (36.7 C) 98 F (36.7 C)  SpO2: 100% 100%   Vitals:   10/16/19 2225 10/17/19 0026 10/17/19 0028 10/17/19 0804  BP: (!) 126/48   (!) 109/47  Pulse: 83 74 74 72  Resp: 16   18  Temp:   98 F (36.7 C) 98 F (36.7 C)  TempSrc:   Oral   SpO2:  99% 100% 100%  Weight:      Height:        General: Elderly female not in distress HEENT: Moist mucosa, supple neck Chest: Clear CVs: Normal S1-S2, no murmurs GI: Soft, nondistended, nontender, G-tube + Musculoskeletal: Left hip incision appears clean    The results of significant diagnostics from this hospitalization (including imaging, microbiology, ancillary and laboratory) are listed below for reference.     Microbiology: Recent Results (from the past 240 hour(s))  Respiratory Panel by RT PCR (Flu A&B, Covid) - Nasopharyngeal Swab     Status: None   Collection Time: 10/10/19  1:20 PM   Specimen: Nasopharyngeal Swab  Result Value Ref Range Status   SARS Coronavirus 2 by RT PCR NEGATIVE NEGATIVE Final  Comment: (NOTE) SARS-CoV-2 target nucleic acids are NOT DETECTED. The SARS-CoV-2 RNA is generally detectable in upper respiratoy specimens during the acute phase of infection. The lowest concentration of SARS-CoV-2 viral copies this assay can detect is 131 copies/mL. A negative result does not preclude SARS-Cov-2 infection and should not be used as the sole basis for treatment or other patient management decisions. A negative result may occur with  improper specimen collection/handling, submission of specimen other than nasopharyngeal swab, presence of viral mutation(s) within the areas targeted by this assay, and inadequate number of  viral copies (<131 copies/mL). A negative result must be combined with clinical observations, patient history, and epidemiological information. The expected result is Negative. Fact Sheet for Patients:  PinkCheek.be Fact Sheet for Healthcare Providers:  GravelBags.it This test is not yet ap proved or cleared by the [place] FDA and  has been authorized for detection and/or diagnosis of SARS-CoV-2 by FDA under an Emergency Use Authorization (EUA). This EUA will remain  in effect (meaning this test can be used) for the duration of the COVID-19 declaration under Section 564(b)(1) of the Act, 21 U.S.C. section 360bbb-3(b)(1), unless the authorization is terminated or revoked sooner.    Influenza A by PCR NEGATIVE NEGATIVE Final   Influenza B by PCR NEGATIVE NEGATIVE Final    Comment: (NOTE) The Xpert Xpress SARS-CoV-2/FLU/RSV assay is intended as an aid in  the diagnosis of influenza from Nasopharyngeal swab specimens and  should not be used as a sole basis for treatment. Nasal washings and  aspirates are unacceptable for Xpert Xpress SARS-CoV-2/FLU/RSV  testing. Fact Sheet for Patients: PinkCheek.be Fact Sheet for Healthcare Providers: GravelBags.it This test is not yet approved or cleared by the [place] FDA and  has been authorized for detection and/or diagnosis of SARS-CoV-2 by  FDA under an Emergency Use Authorization (EUA). This EUA will remain  in effect (meaning this test can be used) for the duration of the  Covid-19 declaration under Section 564(b)(1) of the Act, 21  U.S.C. section 360bbb-3(b)(1), unless the authorization is  terminated or revoked. Performed at [facility], [address] [place], [place] [address]   Respiratory Panel by RT PCR (Flu A&B, Covid) - Nasopharyngeal Swab     Status: None   Collection Time: 10/13/19 10:09 PM    Specimen: Nasopharyngeal Swab  Result Value Ref Range Status   SARS Coronavirus 2 by RT PCR NEGATIVE NEGATIVE Final    Comment: (NOTE) SARS-CoV-2 target nucleic acids are NOT DETECTED. The SARS-CoV-2 RNA is generally detectable in upper respiratoy specimens during the acute phase of infection. The lowest concentration of SARS-CoV-2 viral copies this assay can detect is 131 copies/mL. A negative result does not preclude SARS-Cov-2 infection and should not be used as the sole basis for treatment or other patient management decisions. A negative result may occur with  improper specimen collection/handling, submission of specimen other than nasopharyngeal swab, presence of viral mutation(s) within the areas targeted by this assay, and inadequate number of viral copies (<131 copies/mL). A negative result must be combined with clinical observations, patient history, and epidemiological information. The expected result is Negative. Fact Sheet for Patients:  PinkCheek.be Fact Sheet for Healthcare Providers:  GravelBags.it This test is not yet ap proved or cleared by the [place] FDA and  has been authorized for detection and/or diagnosis of SARS-CoV-2 by FDA under an Emergency Use Authorization (EUA). This EUA will remain  in effect (meaning this test can be used) for the duration  of the COVID-19 declaration under Section 564(b)(1) of the Act, 21 U.S.C. section 360bbb-3(b)(1), unless the authorization is terminated or revoked sooner.    Influenza A by PCR NEGATIVE NEGATIVE Final   Influenza B by PCR NEGATIVE NEGATIVE Final    Comment: (NOTE) The Xpert Xpress SARS-CoV-2/FLU/RSV assay is intended as an aid in  the diagnosis of influenza from Nasopharyngeal swab specimens and  should not be used as a sole basis for treatment. Nasal washings and  aspirates are unacceptable for Xpert Xpress SARS-CoV-2/FLU/RSV  testing. Fact Sheet  for Patients: PinkCheek.be Fact Sheet for Healthcare Providers: GravelBags.it This test is not yet approved or cleared by the [place] FDA and  has been authorized for detection and/or diagnosis of SARS-CoV-2 by  FDA under an Emergency Use Authorization (EUA). This EUA will remain  in effect (meaning this test can be used) for the duration of the  Covid-19 declaration under Section 564(b)(1) of the Act, 21  U.S.C. section 360bbb-3(b)(1), unless the authorization is  terminated or revoked. Performed at [facility], [address] [place], [place] [address]   Surgical pcr screen     Status: Abnormal   Collection Time: 10/13/19 11:25 PM   Specimen: Nasal Mucosa; Nasal Swab  Result Value Ref Range Status   MRSA, PCR POSITIVE (A) NEGATIVE Final    Comment: RESULT CALLED TO, READ BACK BY AND VERIFIED WITH: TERESA COBLE ON 10/14/19 AT 0147 [facility]    Staphylococcus aureus POSITIVE (A) NEGATIVE Final    Comment: (NOTE) The Xpert SA Assay (FDA approved for NASAL specimens in patients 65 years of age and older), is one component of a comprehensive surveillance program. It is not intended to diagnose infection nor to guide or monitor treatment. Performed at [facility], [place], [place] [address]   SARS CORONAVIRUS 2 (TAT 6-24 HRS) Nasopharyngeal Nasopharyngeal Swab     Status: None   Collection Time: 10/16/19  1:56 PM   Specimen: Nasopharyngeal Swab  Result Value Ref Range Status   SARS Coronavirus 2 NEGATIVE NEGATIVE Final    Comment: (NOTE) SARS-CoV-2 target nucleic acids are NOT DETECTED. The SARS-CoV-2 RNA is generally detectable in upper and lower respiratory specimens during the acute phase of infection. Negative results do not preclude SARS-CoV-2 infection, do not rule out co-infections with other pathogens, and should not be used as the sole basis for treatment or other patient  management decisions. Negative results must be combined with clinical observations, patient history, and epidemiological information. The expected result is Negative. Fact Sheet for Patients: SugarRoll.be Fact Sheet for Healthcare Providers: https://www.woods-mathews.com/ This test is not yet approved or cleared by the [place] FDA and  has been authorized for detection and/or diagnosis of SARS-CoV-2 by FDA under an Emergency Use Authorization (EUA). This EUA will remain  in effect (meaning this test can be used) for the duration of the COVID-19 declaration under Section 56 4(b)(1) of the Act, 21 U.S.C. section 360bbb-3(b)(1), unless the authorization is terminated or revoked sooner. Performed at [facility], [place] [address] [place], [place] [address]      Labs: BNP (last 3 results) No results for input(s): BNP in the last 8760 hours. Basic Metabolic Panel: Recent Labs  Lab 10/10/19 1054 10/11/19 0843 10/13/19 2040 10/14/19 0938 10/16/19 1209  NA 140 140 139 138  --   K 3.7 3.3* 4.9 4.4  --   CL 96* 100 94* 94*  --   CO2 _0 --  GLUCOSE 134* 136* 116* 107*  --   BUN 104* 112* 145* 156*  --   CREATININE 8.54* 8.00* 12.22* 13.39* 9.89*  CALCIUM 8.7* 8.8* 9.4 9.1  --    Liver Function Tests: Recent Labs  Lab 10/11/19 0843 10/13/19 2040 10/14/19 0938  AST _0 ALT _1 ALKPHOS 76 86 82  BILITOT 0.7 0.6 0.6  PROT 7.1 7.6 7.1  ALBUMIN 3.0* 3.1* 2.9*   No results for input(s): LIPASE, AMYLASE in the last 168 hours. No results for input(s): AMMONIA in the last 168 hours. CBC: Recent Labs  Lab 10/10/19 1054 10/11/19 0843 10/13/19 2040 10/14/19 0938 10/16/19 1209  WBC 16.0* 14.4* 11.0* 9.3 7.7  NEUTROABS  --  12.2*  --   --   --   HGB 9.9* 9.1* 9.0* 9.2* 8.0*  HCT 30.7* 28.3* 28.5* 29.0* 25.6*  MCV 90.6 91.9 93.1 92.4 94.1  PLT 267 281 241 234 168   Cardiac Enzymes: No results for  input(s): CKTOTAL, CKMB, CKMBINDEX, TROPONINI in the last 168 hours. BNP: Invalid input(s): POCBNP CBG: Recent Labs  Lab 10/16/19 1142 10/16/19 2110 10/16/19 2345 10/17/19 0413 10/17/19 0804  GLUCAP 120* 112* 109* 111* 115*   D-Dimer No results for input(s): DDIMER in the last 72 hours. Hgb A1c No results for input(s): HGBA1C in the last 72 hours. Lipid Profile No results for input(s): CHOL, HDL, LDLCALC, TRIG, CHOLHDL, LDLDIRECT in the last 72 hours. Thyroid function studies No results for input(s): TSH, T4TOTAL, T3FREE, THYROIDAB in the last 72 hours.  Invalid input(s): FREET3 Anemia work up No results for input(s): VITAMINB12, FOLATE, FERRITIN, TIBC, IRON, RETICCTPCT in the last 72 hours. Urinalysis    Component Value Date/Time   COLORURINE YELLOW (A) 01/30/2018 2055   APPEARANCEUR Cloudy (A) 02/28/2019 0902   LABSPEC 1.015 01/30/2018 2055   LABSPEC 1.012 08/02/2012 1114   PHURINE 7.0 01/30/2018 2055   GLUCOSEU Negative 02/28/2019 0902   GLUCOSEU Negative 08/02/2012 1114   HGBUR LARGE (A) 01/30/2018 2055   BILIRUBINUR Negative 02/28/2019 0902   BILIRUBINUR Negative 08/02/2012 1114   KETONESUR NEGATIVE 01/30/2018 2055   PROTEINUR 2+ (A) 02/28/2019 0902   PROTEINUR 100 (A) 01/30/2018 2055   NITRITE Negative 02/28/2019 0902   NITRITE NEGATIVE 01/30/2018 2055   LEUKOCYTESUR Trace (A) 02/28/2019 0902   LEUKOCYTESUR Negative 08/02/2012 1114   Sepsis Labs Invalid input(s): PROCALCITONIN,  WBC,  LACTICIDVEN Microbiology Recent Results (from the past 240 hour(s))  Respiratory Panel by RT PCR (Flu A&B, Covid) - Nasopharyngeal Swab     Status: None   Collection Time: 10/10/19  1:20 PM   Specimen: Nasopharyngeal Swab  Result Value Ref Range Status   SARS Coronavirus 2 by RT PCR NEGATIVE NEGATIVE Final    Comment: (NOTE) SARS-CoV-2 target nucleic acids are NOT DETECTED. The SARS-CoV-2 RNA is generally detectable in upper respiratoy specimens during the acute phase of  infection. The lowest concentration of SARS-CoV-2 viral copies this assay can detect is 131 copies/mL. A negative result does not preclude SARS-Cov-2 infection and should not be used as the sole basis for treatment or other patient management decisions. A negative result may occur with  improper specimen collection/handling, submission of specimen other than nasopharyngeal swab, presence of viral mutation(s) within the areas targeted by this assay, and inadequate number of viral copies (<131 copies/mL). A negative result must be combined with clinical observations, patient history, and epidemiological information. The expected result is Negative. Fact Sheet for Patients:  PinkCheek.be  Fact Sheet for Healthcare Providers:  GravelBags.it This test is not yet ap proved or cleared by the [place] FDA and  has been authorized for detection and/or diagnosis of SARS-CoV-2 by FDA under an Emergency Use Authorization (EUA). This EUA will remain  in effect (meaning this test can be used) for the duration of the COVID-19 declaration under Section 564(b)(1) of the Act, 21 U.S.C. section 360bbb-3(b)(1), unless the authorization is terminated or revoked sooner.    Influenza A by PCR NEGATIVE NEGATIVE Final   Influenza B by PCR NEGATIVE NEGATIVE Final    Comment: (NOTE) The Xpert Xpress SARS-CoV-2/FLU/RSV assay is intended as an aid in  the diagnosis of influenza from Nasopharyngeal swab specimens and  should not be used as a sole basis for treatment. Nasal washings and  aspirates are unacceptable for Xpert Xpress SARS-CoV-2/FLU/RSV  testing. Fact Sheet for Patients: PinkCheek.be Fact Sheet for Healthcare Providers: GravelBags.it This test is not yet approved or cleared by the [place] FDA and  has been authorized for detection and/or diagnosis of SARS-CoV-2 by  FDA under  an Emergency Use Authorization (EUA). This EUA will remain  in effect (meaning this test can be used) for the duration of the  Covid-19 declaration under Section 564(b)(1) of the Act, 21  U.S.C. section 360bbb-3(b)(1), unless the authorization is  terminated or revoked. Performed at [facility], [place] [address] [place], [place] [address]   Respiratory Panel by RT PCR (Flu A&B, Covid) - Nasopharyngeal Swab     Status: None   Collection Time: 10/13/19 10:09 PM   Specimen: Nasopharyngeal Swab  Result Value Ref Range Status   SARS Coronavirus 2 by RT PCR NEGATIVE NEGATIVE Final    Comment: (NOTE) SARS-CoV-2 target nucleic acids are NOT DETECTED. The SARS-CoV-2 RNA is generally detectable in upper respiratoy specimens during the acute phase of infection. The lowest concentration of SARS-CoV-2 viral copies this assay can detect is 131 copies/mL. A negative result does not preclude SARS-Cov-2 infection and should not be used as the sole basis for treatment or other patient management decisions. A negative result may occur with  improper specimen collection/handling, submission of specimen other than nasopharyngeal swab, presence of viral mutation(s) within the areas targeted by this assay, and inadequate number of viral copies (<131 copies/mL). A negative result must be combined with clinical observations, patient history, and epidemiological information. The expected result is Negative. Fact Sheet for Patients:  PinkCheek.be Fact Sheet for Healthcare Providers:  GravelBags.it This test is not yet ap proved or cleared by the [place] FDA and  has been authorized for detection and/or diagnosis of SARS-CoV-2 by FDA under an Emergency Use Authorization (EUA). This EUA will remain  in effect (meaning this test can be used) for the duration of the COVID-19 declaration under Section 564(b)(1) of the Act, 21 U.S.C. section  360bbb-3(b)(1), unless the authorization is terminated or revoked sooner.    Influenza A by PCR NEGATIVE NEGATIVE Final   Influenza B by PCR NEGATIVE NEGATIVE Final    Comment: (NOTE) The Xpert Xpress SARS-CoV-2/FLU/RSV assay is intended as an aid in  the diagnosis of influenza from Nasopharyngeal swab specimens and  should not be used as a sole basis for treatment. Nasal washings and  aspirates are unacceptable for Xpert Xpress SARS-CoV-2/FLU/RSV  testing. Fact Sheet for Patients: PinkCheek.be Fact Sheet for Healthcare Providers: GravelBags.it This test is not yet approved or cleared by the [place] FDA and  has been authorized for detection and/or diagnosis  of SARS-CoV-2 by  FDA under an Emergency Use Authorization (EUA). This EUA will remain  in effect (meaning this test can be used) for the duration of the  Covid-19 declaration under Section 564(b)(1) of the Act, 21  U.S.C. section 360bbb-3(b)(1), unless the authorization is  terminated or revoked. Performed at [facility], [address], [place], [place] [address]   Surgical pcr screen     Status: Abnormal   Collection Time: 10/13/19 11:25 PM   Specimen: Nasal Mucosa; Nasal Swab  Result Value Ref Range Status   MRSA, PCR POSITIVE (A) NEGATIVE Final    Comment: RESULT CALLED TO, READ BACK BY AND VERIFIED WITH: TERESA COBLE ON 10/14/19 AT 0147 [facility]    Staphylococcus aureus POSITIVE (A) NEGATIVE Final    Comment: (NOTE) The Xpert SA Assay (FDA approved for NASAL specimens in patients 33 years of age and older), is one component of a comprehensive surveillance program. It is not intended to diagnose infection nor to guide or monitor treatment. Performed at [facility], [place], [place] [address]   SARS CORONAVIRUS 2 (TAT 6-24 HRS) Nasopharyngeal Nasopharyngeal Swab     Status: None   Collection Time: 10/16/19  1:56 PM    Specimen: Nasopharyngeal Swab  Result Value Ref Range Status   SARS Coronavirus 2 NEGATIVE NEGATIVE Final    Comment: (NOTE) SARS-CoV-2 target nucleic acids are NOT DETECTED. The SARS-CoV-2 RNA is generally detectable in upper and lower respiratory specimens during the acute phase of infection. Negative results do not preclude SARS-CoV-2 infection, do not rule out co-infections with other pathogens, and should not be used as the sole basis for treatment or other patient management decisions. Negative results must be combined with clinical observations, patient history, and epidemiological information. The expected result is Negative. Fact Sheet for Patients: SugarRoll.be Fact Sheet for Healthcare Providers: https://www.woods-mathews.com/ This test is not yet approved or cleared by the [place] FDA and  has been authorized for detection and/or diagnosis of SARS-CoV-2 by FDA under an Emergency Use Authorization (EUA). This EUA will remain  in effect (meaning this test can be used) for the duration of the COVID-19 declaration under Section 56 4(b)(1) of the Act, 21 U.S.C. section 360bbb-3(b)(1), unless the authorization is terminated or revoked sooner. Performed at [facility], [place] [address], [place],  [address]      Time coordinating discharge: 35 minutes  SIGNED:   Louellen Molder, MD  [facility] 10/17/2019, 9:06 AM Pager   If 7PM-7AM, please contact night-coverage [email or web address] Password [email or web address]

## 2019-10-17 NOTE — TOC Progression Note (Signed)
Transition of Care [facility]) - Progression Note    Patient Details  Name: Barbara [name] MRN: [id number] Date of Birth: 08/10/1940  Transition [facility]) CM/SW Contact  Valeria Krisko, Gardiner Rhyme, LCSW Phone Number: 10/17/2019, 8:29 AM  Clinical Narrative:   Received insurance auth for pt to return to [facility], awaiting return call from [facility]. COVID test still good from 3/29.          Expected Discharge Plan and Services                                                 Social Determinants of Health (SDOH) Interventions    Readmission Risk Interventions Readmission Risk Prevention Plan 09/29/2019  Transportation Screening Complete  Medication Review (RN Care Manager) Complete  Palliative Care Screening Not Applicable  Some recent data might be hidden

## 2019-10-17 NOTE — Progress Notes (Signed)
  Subjective: 3 Days Post-Op Procedure(s) (LRB): CLOSED REDUCTION HIP (Left) Patient sleeping peacefully in bed as I enter the room. Plan is for d/c to SNF today. Able to be awakened but quickly falls back to sleep.  Objective: Vital signs in last 24 hours: Temp:  [97.7 F (36.5 C)-98 F (36.7 C)] 98 F (36.7 C) (03/30 0804) Pulse Rate:  [67-83] 72 (03/30 0804) Resp:  [13-20] 18 (03/30 0804) BP: (103-126)/(45-74) 109/47 (03/30 0804) SpO2:  [99 %-100 %] 100 % (03/30 0804)  Intake/Output from previous day:  Intake/Output Summary (Last 24 hours) at 10/17/2019 1001 Last data filed at 10/16/2019 2200 Gross per 24 hour  Intake 0 ml  Output 1001 ml  Net -1001 ml    Intake/Output this shift: No intake/output data recorded.  Labs: Recent Labs    10/16/19 1209  HGB 8.0*   Recent Labs    10/16/19 1209  WBC 7.7  RBC 2.72*  HCT 25.6*  PLT 168   Recent Labs    10/16/19 1209  CREATININE 9.89*   No results for input(s): LABPT, INR in the last 72 hours.   EXAM General - Patient is asleep Extremity - no noted leg length discrepancy, no abnormal rotation of the leg; knee immobilizer remains in place   Assessment/Plan: 3 Days Post-Op Procedure(s) (LRB): CLOSED REDUCTION HIP (Left) Principal Problem:   Anterior dislocation of left hip (HCC) Active Problems:   COPD (chronic obstructive pulmonary disease) (HCC)   Controlled type 2 diabetes mellitus with stage 4 chronic kidney disease, with long-term current use of insulin (HCC)   Essential hypertension   CAD in native artery   Weakness generalized   Protein-calorie malnutrition, severe   ESRD (end stage renal disease) (HCC)   Chronic diastolic CHF (congestive heart failure) (HCC)   Pressure injury of skin  Estimated body mass index is 28.76 kg/m as calculated from the following:   Height as of this encounter: 5' 4"  (1.626 m).   Weight as of this encounter: 76 kg. Up with therapy    Maintain posterior hip  precautions. Flat foot weightbearing to the left leg.   Remain in knee immobilizer. Follow-up with [facility] in two weeks for repeat x-rays.  Raquel Avaiyah Strubel, PA-C [facility] 10/17/2019, 10:01 AM

## 2019-10-17 NOTE — TOC Transition Note (Signed)
Transition of Care [facility]) - CM/SW Discharge Note   Patient Details  Name: Barbara [name] MRN: [id number] Date of Birth: January 18, 1941  Transition of Care [facility]) CM/SW Contact:  Elease Hashimoto, LCSW Phone Number: 10/17/2019, 9:24 AM   Clinical Narrative:  Pt is medically stable to transfer back to [facility]. Insurance Josem Kaufmann [id number] started 3/29 and approved for three days, next update 3/31. CM following [employer]. Fax [phone]. DC paperwork completed and family made aware. Bedside RN to call report to [phone]. Family aware of transfer back today.     Final next level of care: Skilled Nursing Facility Barriers to Discharge: Barriers Resolved   Patient Goals and CMS Choice        Discharge Placement              Patient chooses bed at: [facility] [place] Patient to be transferred to facility by: EMS Name of family member notified: james-husband and Edna-daughter Patient and family notified of of transfer: 10/17/19  Discharge Plan and Services                                     Social Determinants of Health ([place]) Interventions     Readmission Risk Interventions Readmission Risk Prevention Plan 09/29/2019  Transportation Screening Complete  Medication Review Press photographer) Complete  Palliative Care Screening Not Applicable  Some recent data might be hidden

## 2019-10-17 NOTE — Progress Notes (Signed)
Patient is discharging to [facility] report given to [facility]. Patient's IV removed, belongings packed. Patient is stable and is transporting via EMS to skilled nursing facility.

## 2019-10-17 NOTE — Progress Notes (Signed)
[facility]  ROUNDING NOTE   Subjective:  Patient completed dialysis treatment yesterday. It appears that she will be discharged back to nursing home today. Currently awake and alert.   Objective:  Vital signs in last 24 hours:  Temp:  [97.7 F (36.5 C)-98 F (36.7 C)] 98 F (36.7 C) (03/30 0804) Pulse Rate:  [67-83] 72 (03/30 0804) Resp:  [13-20] 18 (03/30 0804) BP: (103-126)/(45-74) 109/47 (03/30 0804) SpO2:  [99 %-100 %] 100 % (03/30 0804)  Weight change:  Filed Weights   10/13/19 1913  Weight: 76 kg    Intake/Output: I/O last 3 completed shifts: In: 0  Out: 1002 [Other:1000; Stool:2]   Intake/Output this shift:  No intake/output data recorded.  Physical Exam: General: NAD, patient resting in bed   Head: Normocephalic, atraumatic. Moist oral mucosal membranes  Eyes: Anicteric,   Lungs:  Clear to auscultation, normal effort  Heart: No rub  Abdomen:  Soft, nontender  Extremities: Left lower extremity in cast, minimal right lower extremity edema  Neurologic: Alert, able to follow simple commands  Skin: No lesions  Access: Left arm AVG    Basic Metabolic Panel: Recent Labs  Lab 10/11/19 0843 10/13/19 2040 10/14/19 0938 10/16/19 1209  NA 140 139 138  --   K 3.3* 4.9 4.4  --   CL 100 94* 94*  --   CO2 25 25 23   --   GLUCOSE 136* 116* 107*  --   BUN 112* 145* 156*  --   CREATININE 8.00* 12.22* 13.39* 9.89*  CALCIUM 8.8* 9.4 9.1  --     Liver Function Tests: Recent Labs  Lab 10/11/19 0843 10/13/19 2040 10/14/19 0938  AST 17 21 16   ALT 17 10 13   ALKPHOS 76 86 82  BILITOT 0.7 0.6 0.6  PROT 7.1 7.6 7.1  ALBUMIN 3.0* 3.1* 2.9*   No results for input(s): LIPASE, AMYLASE in the last 168 hours. No results for input(s): AMMONIA in the last 168 hours.  CBC: Recent Labs  Lab 10/11/19 0843 10/13/19 2040 10/14/19 0938 10/16/19 1209  WBC 14.4* 11.0* 9.3 7.7  NEUTROABS 12.2*  --   --   --   HGB 9.1* 9.0* 9.2* 8.0*  HCT 28.3* 28.5*  29.0* 25.6*  MCV 91.9 93.1 92.4 94.1  PLT 281 241 234 168    Cardiac Enzymes: No results for input(s): CKTOTAL, CKMB, CKMBINDEX, TROPONINI in the last 168 hours.  BNP: Invalid input(s): POCBNP  CBG: Recent Labs  Lab 10/16/19 2110 10/16/19 2345 10/17/19 0413 10/17/19 0804 10/17/19 1137  GLUCAP 112* 109* 111* 115* 106*    Microbiology: Results for orders placed or performed during the hospital encounter of 10/13/19  Respiratory Panel by RT PCR (Flu A&B, Covid) - Nasopharyngeal Swab     Status: None   Collection Time: 10/13/19 10:09 PM   Specimen: Nasopharyngeal Swab  Result Value Ref Range Status   SARS Coronavirus 2 by RT PCR NEGATIVE NEGATIVE Final    Comment: (NOTE) SARS-CoV-2 target nucleic acids are NOT DETECTED. The SARS-CoV-2 RNA is generally detectable in upper respiratoy specimens during the acute phase of infection. The lowest concentration of SARS-CoV-2 viral copies this assay can detect is 131 copies/mL. A negative result does not preclude SARS-Cov-2 infection and should not be used as the sole basis for treatment or other patient management decisions. A negative result may occur with  improper specimen collection/handling, submission of specimen other than nasopharyngeal swab, presence of viral mutation(s) within the areas targeted by this  assay, and inadequate number of viral copies (<131 copies/mL). A negative result must be combined with clinical observations, patient history, and epidemiological information. The expected result is Negative. Fact Sheet for Patients:  PinkCheek.be Fact Sheet for Healthcare Providers:  GravelBags.it This test is not yet ap proved or cleared by the [place] FDA and  has been authorized for detection and/or diagnosis of SARS-CoV-2 by FDA under an Emergency Use Authorization (EUA). This EUA will remain  in effect (meaning this test can be used) for the duration of  the COVID-19 declaration under Section 564(b)(1) of the Act, 21 U.S.C. section 360bbb-3(b)(1), unless the authorization is terminated or revoked sooner.    Influenza A by PCR NEGATIVE NEGATIVE Final   Influenza B by PCR NEGATIVE NEGATIVE Final    Comment: (NOTE) The Xpert Xpress SARS-CoV-2/FLU/RSV assay is intended as an aid in  the diagnosis of influenza from Nasopharyngeal swab specimens and  should not be used as a sole basis for treatment. Nasal washings and  aspirates are unacceptable for Xpert Xpress SARS-CoV-2/FLU/RSV  testing. Fact Sheet for Patients: PinkCheek.be Fact Sheet for Healthcare Providers: GravelBags.it This test is not yet approved or cleared by the [place] FDA and  has been authorized for detection and/or diagnosis of SARS-CoV-2 by  FDA under an Emergency Use Authorization (EUA). This EUA will remain  in effect (meaning this test can be used) for the duration of the  Covid-19 declaration under Section 564(b)(1) of the Act, 21  U.S.C. section 360bbb-3(b)(1), unless the authorization is  terminated or revoked. Performed at [facility], [address], [place], [place] [address]   Surgical pcr screen     Status: Abnormal   Collection Time: 10/13/19 11:25 PM   Specimen: Nasal Mucosa; Nasal Swab  Result Value Ref Range Status   MRSA, PCR POSITIVE (A) NEGATIVE Final    Comment: RESULT CALLED TO, READ BACK BY AND VERIFIED WITH: TERESA COBLE ON 10/14/19 AT 0147 [facility]    Staphylococcus aureus POSITIVE (A) NEGATIVE Final    Comment: (NOTE) The Xpert SA Assay (FDA approved for NASAL specimens in patients 54 years of age and older), is one component of a comprehensive surveillance program. It is not intended to diagnose infection nor to guide or monitor treatment. Performed at [facility], [place], [place] [address]   SARS CORONAVIRUS 2 (TAT 6-24 HRS) Nasopharyngeal  Nasopharyngeal Swab     Status: None   Collection Time: 10/16/19  1:56 PM   Specimen: Nasopharyngeal Swab  Result Value Ref Range Status   SARS Coronavirus 2 NEGATIVE NEGATIVE Final    Comment: (NOTE) SARS-CoV-2 target nucleic acids are NOT DETECTED. The SARS-CoV-2 RNA is generally detectable in upper and lower respiratory specimens during the acute phase of infection. Negative results do not preclude SARS-CoV-2 infection, do not rule out co-infections with other pathogens, and should not be used as the sole basis for treatment or other patient management decisions. Negative results must be combined with clinical observations, patient history, and epidemiological information. The expected result is Negative. Fact Sheet for Patients: SugarRoll.be Fact Sheet for Healthcare Providers: https://www.woods-mathews.com/ This test is not yet approved or cleared by the [place] FDA and  has been authorized for detection and/or diagnosis of SARS-CoV-2 by FDA under an Emergency Use Authorization (EUA). This EUA will remain  in effect (meaning this test can be used) for the duration of the COVID-19 declaration under Section 56 4(b)(1) of the Act, 21 U.S.C. section 360bbb-3(b)(1), unless  the authorization is terminated or revoked sooner. Performed at [facility], [place] [address] [place], [place] [address]     Coagulation Studies: No results for input(s): LABPROT, INR in the last 72 hours.  Urinalysis: No results for input(s): COLORURINE, LABSPEC, PHURINE, GLUCOSEU, HGBUR, BILIRUBINUR, KETONESUR, PROTEINUR, UROBILINOGEN, NITRITE, LEUKOCYTESUR in the last 72 hours.  Invalid input(s): APPERANCEUR    Imaging: No results found.   Medications:   . sodium chloride Stopped (10/13/19 2110)   . amLODipine  5 mg Per Tube Daily  . atorvastatin  40 mg Per Tube QHS  . B-complex with vitamin C  1 tablet Per Tube QHS  . calcium acetate (Phos  Binder)  1,334 mg Per Tube TID with meals  . calcium carbonate  1 tablet Per Tube QHS  . Chlorhexidine Gluconate Cloth  6 each Topical Q0600  . Chlorhexidine Gluconate Cloth  6 each Topical Q0600  . cholecalciferol  1,000 Units Oral Daily  . docusate sodium  100 mg Oral BID  . epoetin (EPOGEN/PROCRIT) injection  4,000 Units Subcutaneous Q M,W,F-HD  . feeding supplement (NEPRO CARB STEADY)  1,000 mL Per Tube Q24H  . feeding supplement (PRO-STAT SUGAR FREE 64)  30 mL Per Tube BID  . ferrous sulfate  324 mg Per Tube QHS  . free water  75 mL Per Tube Q6H  . gabapentin  600 mg Per Tube TID  . heparin  5,000 Units Subcutaneous Q8H  . insulin aspart  0-5 Units Subcutaneous QHS  . insulin aspart  0-6 Units Subcutaneous TID WC  . lacosamide  100 mg Per Tube BID  . mouth rinse  15 mL Mouth Rinse BID  . multivitamin  1 tablet Per Tube QHS  . mupirocin ointment  1 application Nasal BID  . pantoprazole sodium  40 mg Oral QAC breakfast   albuterol, bisacodyl, heparin, lidocaine (PF), lidocaine-prilocaine, liver oil-zinc oxide, menthol-cetylpyridinium **OR** phenol, metoCLOPramide **OR** metoCLOPramide (REGLAN) injection, ondansetron **OR** ondansetron (ZOFRAN) IV, senna-docusate, traMADol  Assessment/ Plan:  [name] is a 79 y.o. black female with end stage renal disease on hemodialysis, hypertension, diabetes mellitus type II, diabetic neuropathy, seizure disorder, left hip repair, anemia of chronic kidney disease, secondary hyperparathyroidism. Dislocation closed [T14.8XXA] Fall [W19.XXXA] Anterior dislocation of left hip, initial encounter ([place]) [S73.035A]   [facility] LUE AVG 72 kg  FDODE 11/20/2016   1.  ESRD on HD:  Patient completed dialysis yesterday.  No urgent indication for dialysis today.  Patient will resume outpatient dialysis treatment tomorrow.   2. Anemia of chronic kidney disease.  With GI bleed.  EGD from March 11 showed nonbleeding gastric  ulcer Requiring PRBC transfusion on 3/10 and 3/11.  -Continue Epogen as an outpatient. Lab Results  Component Value Date   HGB 8.0 (L) 10/16/2019     3.  Secondary hyperparathyroidism.  Lab Results  Component Value Date   PTH 310 (H) 09/06/2019   CALCIUM 9.1 10/14/2019   CAION 0.85 (LL) 09/05/2019   PHOS 4.7 (H) 10/09/2019  We will continue to monitor bone mineral metabolism parameters as an outpatient.   4.  Confusion, delirium, acute encephalopathy Improved at the moment.  Currently awake and alert and follows commands.     LOS: 4 Jenasia Dolinar 3/30/20213:16 PM

## 2019-10-17 NOTE — Progress Notes (Signed)
Physical Therapy Treatment Patient Details Name: Barbara [name] MRN: [id number] DOB: [birth year] Today's Date: 10/17/2019    History of Present Illness 79 yo female with fall and L hip dislocation in SNF was readmitted for procedure to relocate L hip.  Recent admissions for acute GI bleed, had acute hypoxic respiratory failure and CHF exacerbation, was intubated and then on HFNC.  Original hosp was L total hip with posterior approach mid Feb 2021.  PMHx:  ESRD, HD, CHF, OA, bronchitis, GERD, HTN, O2 dependence, CAD, DM,      PT Comments    Pt is making limited progress towards mainly limited by alertness level. INtially alert and hoping to participate. Then becomes very lethargic and takes max assist to perform there-ex. Attempted bed mobility, unable to participate. Will continue to progress as able.   Follow Up Recommendations  SNF     Equipment Recommendations  None recommended by PT    Recommendations for Other Services       Precautions / Restrictions Precautions Precautions: Posterior Hip;Fall Precaution Booklet Issued: No Precaution Comments: PEG tube, L knee immobilizer Required Braces or Orthoses: Knee Immobilizer - Left Knee Immobilizer - Left: On at all times Restrictions Weight Bearing Restrictions: Yes LLE Weight Bearing: Touchdown weight bearing Other Position/Activity Restrictions: 20% with Flat foot on ground    Mobility  Bed Mobility               General bed mobility comments: attempted, however pt very lethargic and unable to participate  Transfers                    Ambulation/Gait                 Stairs             Wheelchair Mobility    Modified Rankin (Stroke Patients Only)       Balance                                            Cognition Arousal/Alertness: Awake/alert Behavior During Therapy: Flat affect Overall Cognitive Status: Impaired/Different from baseline                                 General Comments: initially started as awake and then became more sleepy and hard to arouse. Then then completly ignores therapist and session ended.      Exercises Other Exercises Other Exercises: supine ther-ex performed on B LE including SLRs, hip abd/add, and R LE SAQ. All ther-ex performed with max/total assist with limited participation noted    General Comments        Pertinent Vitals/Pain Pain Assessment: No/denies pain    Home Living                      Prior Function            PT Goals (current goals can now be found in the care plan section) Acute Rehab PT Goals Patient Stated Goal: get up PT Goal Formulation: Patient unable to participate in goal setting Time For Goal Achievement: 10/29/19 Potential to Achieve Goals: Fair Progress towards PT goals: Progressing toward goals    Frequency    Min 2X/week      PT Plan Current plan remains appropriate  Co-evaluation              AM-PAC PT "6 Clicks" Mobility   Outcome Measure  Help needed turning from your back to your side while in a flat bed without using bedrails?: Total Help needed moving from lying on your back to sitting on the side of a flat bed without using bedrails?: Total Help needed moving to and from a bed to a chair (including a wheelchair)?: Total Help needed standing up from a chair using your arms (e.g., wheelchair or bedside chair)?: Total Help needed to walk in hospital room?: Total Help needed climbing 3-5 steps with a railing? : Total 6 Click Score: 6    End of Session Equipment Utilized During Treatment: Oxygen Activity Tolerance: Patient limited by lethargy Patient left: in bed;with call bell/phone within reach;with bed alarm set Nurse Communication: Mobility status PT Visit Diagnosis: Muscle weakness (generalized) (M62.81);Difficulty in walking, not elsewhere classified (R26.2);History of falling (Z91.81) Pain - Right/Left: Left Pain - part  of body: Hip     Time: 0947-0962 PT Time Calculation (min) (ACUTE ONLY): 11 min  Charges:  $Therapeutic Exercise: 8-22 mins                     Greggory Stallion, PT, DPT [phone]    Jalessa Peyser 10/17/2019, 9:50 AM

## 2019-10-18 ENCOUNTER — Other Ambulatory Visit: Payer: Self-pay

## 2019-10-18 ENCOUNTER — Non-Acute Institutional Stay: Payer: Medicare Other | Admitting: Primary Care

## 2019-10-18 DIAGNOSIS — Z515 Encounter for palliative care: Secondary | ICD-10-CM

## 2019-10-18 NOTE — Progress Notes (Signed)
[facility] Consult Note Telephone: [phone]  Fax: [phone]    PATIENT NAME: Barbara [name] [place] [address] [phone] (home)  DOB: [birth year] MRN: [id number]  PRIMARY CARE PROVIDER:   Juluis Pitch, MD [place] [address]  REFERRING PROVIDER:  Juluis Pitch, MD [address]uis Pitch, [place],  [place] [address]  RESPONSIBLE PARTY:   Extended Emergency Contact Information Primary Emergency Contact: Flaim,[name] Address: [address]          [place], [place] [address] Johnnette Litter of [place] Phone: [phone] Relation: Spouse Secondary Emergency Contact: Lunette Stands [place] Mobile Phone: [phone] Relation: Daughter  I met with Mrs Dumais in her room.  ASSESSMENT AND RECOMMENDATIONS:   1. Advance Care Planning/Goals of Care: Goals include to maximize quality of life and symptom management.MOST outlines full scope of care. Has tube feedings currently. I will reach out to RR to discuss any concerns or goals of care.   2. Symptom Management: Mrs. Casserly has returned from dialysis. She was readmitted last week to [facility] due to a fall and subsequent hip repair. She now endorse mild pain but is interactive and calm. She is also receiving a PPD today.   3. Family /Caregiver/Community Supports: Has husband who is RR. Now living in LTC.  4. Cognitive / Functional decline:  Alert, oriented x 1-2. Appears fatigued. Up in chair with oxygen at 2-3 L  5. Follow up Palliative Care Visit: Palliative care will continue to follow for goals of care clarification and symptom management. Return 2-4 weeks or prn.  I spent 25 minutes providing this consultation,  from 1530 to 1555. More than 50% of the time in this consultation was spent coordinating communication.   HISTORY OF PRESENT ILLNESS:  Barbara [name] is a 79 y.o. year old female with multiple medical problems  including bladder cancer, DM, ESRD on HD, artificial nutrition, . Palliative Care was asked to follow this patient by consultation request of Juluis Pitch, MD [address] [place],  [place] [address]  to help address advance care planning and goals of care. This is the initial  visit.  CODE STATUS: FULL CODE  PPS: 30% HOSPICE ELIGIBILITY/DIAGNOSIS: TBD  PAST MEDICAL HISTORY:  Past Medical History:  Diagnosis Date  . Adult celiac disease   . Anemia associated with chronic renal failure 2017   blood transfusion last week 10/17  . Arthritis   . Asthma   . Cancer ([place]) 2017   bladder  . Chronic kidney disease   . 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  dr. Candiss Norse and dr. Holley Raring follow her  . COPD (chronic obstructive pulmonary disease) ([place])   . Coronary artery disease   . Diabetes mellitus without complication ([place])   . Dialysis patient ([place])    Tues, Thurs, Sat  . Dyspnea    with exertion  . Elevated lipids   . GERD (gastroesophageal reflux disease)   . Hematuria   . Hypertension   . Lower back pain   . Neuropathy   . Oxygen dependent    at hs  . Personal history of chemotherapy 2017   bladder ca  . Personal history of radiation therapy 2017   bladder ca  . Sleep apnea    uses cpap  . Urinary obstruction 01/2016    SOCIAL HX:  Social History   Tobacco Use  . Smoking status: Former Smoker  Types: Cigarettes    Quit date: 07/20/2002    Years since quitting: 17.2  . Smokeless tobacco: Never Used  Substance Use Topics  . Alcohol use: No    ALLERGIES:  Allergies  Allergen Reactions  . Glipizide Er Other (See Comments)    Hypoglycemia   . Percocet [Oxycodone-Acetaminophen] Hives     PERTINENT MEDICATIONS:  Outpatient Encounter Medications as of 10/18/2019  Medication Sig  . albuterol (PROVENTIL HFA;VENTOLIN HFA) 108 (90 Base) MCG/ACT inhaler Inhale 2 puffs into the lungs every 6 (six) hours as needed for wheezing or shortness of breath.   .  Amino Acids-Protein Hydrolys (FEEDING SUPPLEMENT, PRO-STAT SUGAR FREE 64,) LIQD Place 30 mLs into feeding tube 2 (two) times daily.  Marland Kitchen amLODipine (NORVASC) 5 MG tablet Place 1 tablet (5 mg total) into feeding tube daily.  Marland Kitchen atorvastatin (LIPITOR) 40 MG tablet Place 40 mg into feeding tube at bedtime.   . B Complex-C (B-COMPLEX WITH VITAMIN C) tablet Place 1 tablet into feeding tube at bedtime.  . Blood Glucose Monitoring Suppl (ACCU-CHEK AVIVA PLUS) w/Device KIT (USE TO MONITOR BLOOD SUGARS.)  . Calcium Acetate 667 MG TABS Place 1,334 mg into feeding tube 3 (three) times daily.  . calcium carbonate (TUMS - DOSED IN MG ELEMENTAL CALCIUM) 500 MG chewable tablet Place 1 tablet into feeding tube at bedtime.   . cholecalciferol (VITAMIN D) 1000 units tablet Take 1 tablet (1,000 Units total) by mouth daily.  Marland Kitchen docusate sodium (COLACE) 100 MG capsule Take 1 capsule (100 mg total) by mouth daily as needed for mild constipation.  Marland Kitchen epoetin alfa (EPOGEN) 10000 UNIT/ML injection Inject 1 mL (10,000 Units total) into the vein every Monday, Wednesday, and Friday with hemodialysis. (Patient not taking: Reported on 10/14/2019)  . esomeprazole (NEXIUM) 40 MG packet 40 mg daily before breakfast. Per tube  . ferrous sulfate 300 (60 Fe) MG/5ML syrup Place 324 mg into feeding tube at bedtime.  . gabapentin (NEURONTIN) 300 MG capsule Place 2 capsules (600 mg total) into feeding tube 3 (three) times daily.  . heparin 5000 UNIT/ML injection Inject 1 mL (5,000 Units total) into the skin every 12 (twelve) hours.  Marland Kitchen lacosamide 100 MG TABS Place 1 tablet (100 mg total) into feeding tube 2 (two) times daily.  . metoCLOPramide (REGLAN) 5 MG/5ML solution Place 5 mg into feeding tube every 8 (eight) hours.  . multivitamin (RENA-VIT) TABS tablet Place 1 tablet into feeding tube at bedtime.  . NON FORMULARY Place 1 Units into the nose at bedtime. CPAP Time of use 2100-0600  . OXYGEN Inhale 2 L into the lungs at bedtime.  .  traMADol (ULTRAM) 50 MG tablet Take 1 tablet (50 mg total) by mouth every 12 (twelve) hours as needed for moderate pain.   No facility-administered encounter medications on file as of 10/18/2019.    PHYSICAL EXAM / ROS:   Current and past weights: 167 lbs per hospital record General: NAD, frail appearing Cardiovascular: no chest pain reported, no edema  Pulmonary: no cough, no increased SOB, oxygen at 2 L Abdomen:  tube feeding, denies constipation, incontinent of bowel GU: denies dysuria, incontinent of urine- ESRD patient MSK: L hip joint injury,non- Ambulatory, left leg in brace. Skin: no rashes or wounds reported Neurological: Weakness,  Cognitive impairment  Jason Coop, NP [facility]  COVID-19 PATIENT SCREENING TOOL  Person answering questions: ____________Staff_______ _____   1.  Is the patient or any family member in the home showing any signs or symptoms regarding  respiratory infection?               Person with Symptom- __________NA_________________  a. Fever                                                                          Yes___ No___          ___________________  b. Shortness of breath                                                    Yes___ No___          ___________________ c. Cough/congestion                                       Yes___  No___         ___________________ d. Body aches/pains                                                         Yes___ No___        ____________________ e. Gastrointestinal symptoms (diarrhea, nausea)           Yes___ No___        ____________________  2. Within the past 14 days, has anyone living in the home had any contact with someone with or under investigation for COVID-19?    Yes___ No_X_   Person __________________

## 2019-11-01 ENCOUNTER — Non-Acute Institutional Stay: Payer: Medicare Other | Admitting: Primary Care

## 2019-11-01 ENCOUNTER — Other Ambulatory Visit: Payer: Self-pay

## 2019-11-01 DIAGNOSIS — Z515 Encounter for palliative care: Secondary | ICD-10-CM

## 2019-11-01 NOTE — Progress Notes (Signed)
[facility] Consult Note Telephone: [phone]  Fax: [phone]    PATIENT NAME: Barbara [name] [place] [address] [phone] (home)  DOB: 1941/01/16 MRN: [id number]  PRIMARY CARE PROVIDER:   Juluis Pitch, MD, [address] [place] [address] [phone]  REFERRING PROVIDER:  Juluis Pitch, MD [address] [place],  [place] [address] [phone]  RESPONSIBLE PARTY:   Extended Emergency Contact Information Primary Emergency Contact: Stobaugh,[name] Address: [address]          [place], [place] [address] Johnnette Litter of [place] Phone: [phone] Relation: Spouse Secondary Emergency Contact: Lunette Stands [place] of [place] Mobile Phone: [phone] Relation: Daughter  I met with patient in the facility.  ASSESSMENT AND RECOMMENDATIONS:   1. Advance Care Planning/Goals of Care: Goals include to maximize quality of life and symptom management. FULL CODE. Attempted to reach Mr Ganim, [place], but no answer and no ability to leave a message.   2. Symptom Management:    UTI: Has periods of hypoactive delirium, not knowing what to do with the phone. Being Rx with macrobid for bacteria in urine.  Intake: Improving, has fewer feedings as she is eating mostly > 50%. Consider offering po supplements.    3. Family /Caregiver/Community Supports: Family contacts patient and interactive. Goes to HD 3 days/ week.   4. Cognitive / Functional decline: Flat affect, alert and oriented x 1. Chair bound. Needs help with all adls, all iadls.   5. Follow up Palliative Care Visit: Palliative care will continue to follow for goals of care clarification and symptom management. Return 4 weeks or prn.  I spent 25 minutes providing this consultation,  from 1530 to 1555. More than 50% of the time in this consultation was spent coordinating communication.   HISTORY OF PRESENT ILLNESS:  Barbara [name] is a 79 y.o. year old  female with multiple medical problems including H/O bladder cancer, DM, ESRD on HD, artificial nutrition. Palliative Care was asked to follow this patient by consultation request of Juluis Pitch, MD to help address advance care planning and goals of care. This is a follow up visit.  CODE STATUS: FULL CODE  PPS: 30% HOSPICE ELIGIBILITY/DIAGNOSIS: TBD  PAST MEDICAL HISTORY:  Past Medical History:  Diagnosis Date  . Adult celiac disease   . Anemia associated with chronic renal failure 2017   blood transfusion last week 10/17  . Arthritis   . Asthma   . Cancer ([facility]) 2017   bladder  . Chronic kidney disease   . 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  dr. Candiss Norse and dr. Holley Raring follow her  . COPD (chronic obstructive pulmonary disease) ([facility])   . Coronary artery disease   . Diabetes mellitus without complication ([facility])   . Dialysis patient ([facility])    Tues, Thurs, Sat  . Dyspnea    with exertion  . Elevated lipids   . GERD (gastroesophageal reflux disease)   . Hematuria   . Hypertension   . Lower back pain   . Neuropathy   . Oxygen dependent    at hs  . Personal history of chemotherapy 2017   bladder ca  . Personal history of radiation therapy 2017   bladder ca  . Sleep apnea    uses cpap  . Urinary obstruction 01/2016    SOCIAL HX:  Social History   Tobacco Use  . Smoking status: Former Smoker    Types: Cigarettes  Quit date: 07/20/2002    Years since quitting: 17.2  . Smokeless tobacco: Never Used  Substance Use Topics  . Alcohol use: No    ALLERGIES:  Allergies  Allergen Reactions  . Glipizide Er Other (See Comments)    Hypoglycemia   . Percocet [Oxycodone-Acetaminophen] Hives     PERTINENT MEDICATIONS:  Outpatient Encounter Medications as of 11/01/2019  Medication Sig  . albuterol (PROVENTIL HFA;VENTOLIN HFA) 108 (90 Base) MCG/ACT inhaler Inhale 2 puffs into the lungs every 6 (six) hours as needed for wheezing or shortness of breath.   .  Amino Acids-Protein Hydrolys (FEEDING SUPPLEMENT, PRO-STAT SUGAR FREE 64,) LIQD Place 30 mLs into feeding tube 2 (two) times daily.  Marland Kitchen amLODipine (NORVASC) 5 MG tablet Place 1 tablet (5 mg total) into feeding tube daily.  Marland Kitchen atorvastatin (LIPITOR) 40 MG tablet Place 40 mg into feeding tube at bedtime.   . B Complex-C (B-COMPLEX WITH VITAMIN C) tablet Place 1 tablet into feeding tube at bedtime.  . Blood Glucose Monitoring Suppl (ACCU-CHEK AVIVA PLUS) w/Device KIT (USE TO MONITOR BLOOD SUGARS.)  . Calcium Acetate 667 MG TABS Place 1,334 mg into feeding tube 3 (three) times daily.  . calcium carbonate (TUMS - DOSED IN MG ELEMENTAL CALCIUM) 500 MG chewable tablet Place 1 tablet into feeding tube at bedtime.   . cholecalciferol (VITAMIN D) 1000 units tablet Take 1 tablet (1,000 Units total) by mouth daily.  Marland Kitchen docusate sodium (COLACE) 100 MG capsule Take 1 capsule (100 mg total) by mouth daily as needed for mild constipation.  Marland Kitchen epoetin alfa (EPOGEN) 10000 UNIT/ML injection Inject 1 mL (10,000 Units total) into the vein every Monday, Wednesday, and Friday with hemodialysis. (Patient not taking: Reported on 10/14/2019)  . esomeprazole (NEXIUM) 40 MG packet 40 mg daily before breakfast. Per tube  . ferrous sulfate 300 (60 Fe) MG/5ML syrup Place 324 mg into feeding tube at bedtime.  . gabapentin (NEURONTIN) 300 MG capsule Place 2 capsules (600 mg total) into feeding tube 3 (three) times daily.  . heparin 5000 UNIT/ML injection Inject 1 mL (5,000 Units total) into the skin every 12 (twelve) hours.  Marland Kitchen lacosamide 100 MG TABS Place 1 tablet (100 mg total) into feeding tube 2 (two) times daily.  . metoCLOPramide (REGLAN) 5 MG/5ML solution Place 5 mg into feeding tube every 8 (eight) hours.  . multivitamin (RENA-VIT) TABS tablet Place 1 tablet into feeding tube at bedtime.  . NON FORMULARY Place 1 Units into the nose at bedtime. CPAP Time of use 2100-0600  . OXYGEN Inhale 2 L into the lungs at bedtime.  .  traMADol (ULTRAM) 50 MG tablet Take 1 tablet (50 mg total) by mouth every 12 (twelve) hours as needed for moderate pain.   No facility-administered encounter medications on file as of 11/01/2019.    PHYSICAL EXAM / ROS:   Current and past weights: unavailable General: NAD, frail appearing, obese,  Cardiovascular: no chest pain reported, no edema,  Pulmonary: no cough, no increased SOB, no using oxygen aat this interview. Abdomen: appetite good, endorses constipation, incontinent of bowel, not getting many bolus feedings due to good intake.  GU: denies dysuria, incontinent of urine, has UTI just begun to Rx with macrobid, culture pending, ESRD MSK:  no joint deformities, non -ambulatory, in chair,  Left leg brace S/p hip fx. Skin: no rashes or wounds reported Neurological: Weakness, flatter affect than baseline, some confusion.   Jason Coop, NP [facility]  COVID-19 PATIENT SCREENING TOOL  Person answering  questions: ____________Staff_______ _____   1.  Is the patient or any family member in the home showing any signs or symptoms regarding respiratory infection?               Person with Symptom- __________NA_________________  a. Fever                                                                          Yes___ No___          ___________________  b. Shortness of breath                                                    Yes___ No___          ___________________ c. Cough/congestion                                       Yes___  No___         ___________________ d. Body aches/pains                                                         Yes___ No___        ____________________ e. Gastrointestinal symptoms (diarrhea, nausea)           Yes___ No___        ____________________  2. Within the past 14 days, has anyone living in the home had any contact with someone with or under investigation for COVID-19?    Yes___ No_X_   Person __________________

## 2019-12-13 ENCOUNTER — Non-Acute Institutional Stay: Payer: Medicare Other | Admitting: Primary Care

## 2019-12-13 ENCOUNTER — Other Ambulatory Visit: Payer: Self-pay

## 2019-12-14 ENCOUNTER — Other Ambulatory Visit: Payer: Self-pay

## 2019-12-14 ENCOUNTER — Non-Acute Institutional Stay: Payer: Medicare Other | Admitting: Primary Care

## 2019-12-14 DIAGNOSIS — Z515 Encounter for palliative care: Secondary | ICD-10-CM

## 2019-12-14 DIAGNOSIS — Z992 Dependence on renal dialysis: Secondary | ICD-10-CM

## 2019-12-14 NOTE — Progress Notes (Signed)
[facility] Consult Note Telephone: [phone]  Fax: [phone]  PATIENT NAME: Barbara [name] [place] [address] [phone] (home) [phone] (work) DOB: Feb 13, 1941 MRN: [id number]  PRIMARY CARE PROVIDER:    Juluis Pitch, MD,  [address] [place] [address] [phone]  [facility]:   Juluis Pitch, MD [address] [place],  [place] [address] [phone]  RESPONSIBLE PARTY:   Extended Emergency Contact Information Primary Emergency Contact: Gleaves,[name] Address: [address]          [place], [place] [address] Johnnette Litter of [place] Phone: [phone] Relation: Spouse Secondary Emergency Contact: Lunette Stands [place] Mobile Phone: [phone] Relation: Daughter  I met with patient in facility.  ASSESSMENT AND RECOMMENDATIONS:   1. Advance Care Planning/Goals of Care: Goals include to maximize quality of life and symptom management. MOST on file with full scope of care. We discussed futility of CPR in advanced debility and disease.   Our advance care planning conversation included a discussion about:   . The value and importance of advance care planning  . Experiences with loved ones who have been seriously ill or have died  . Exploration of personal, cultural or spiritual beliefs that might influence medical decisions  . Exploration of goals of care in the event of a sudden injury or illness  . Identification and preparation of a healthcare agent  . Review  of an advance directive document   2. Symptom Management:   I met with Barbara [name] who was resting in her bed. She did not have her dialysis treatment today and was a little more alert than days when she goes. She did not have any complaints of pain or discomfort. Staff report she's eating fair but continues to decline in alertness and function. I had a lengthy discussion with her husband about her decline and  care needs. He is having difficulty himself as they were basically each others' caregiver. He had some concerns about power of attorney and a daughter who is trying to make some will changes. I referred him to consult elder care attorney he has concerns about legal matters. I will continue to follow for palliative needs  3. Family /Caregiver/Community Supports:  Husband Jeneen Rinks is POA. Living in SNF due to increased care needs.   4. Cognitive / Functional decline:  A and O x 1, declining in adl function.  5. Follow up Palliative Care Visit: Palliative care will continue to follow for goals of care clarification and symptom management. Return 8 weeks or prn.  I spent 50 minutes providing this consultation,  from 1600 to 1640. More than 50% of the time in this consultation was spent coordinating communication.   HISTORY OF PRESENT ILLNESS:  Barbara [name] is a 79 y.o. year old female with multiple medical problems including ESRD, cognitive impairment. Palliative Care was asked to follow this patient by consultation request of Juluis Pitch, MD to help address advance care planning and goals of care. This is a follow up visit.  CODE STATUS: FULL CODE  PPS: 30% HOSPICE ELIGIBILITY/DIAGNOSIS: TBD  PAST MEDICAL HISTORY:  Past Medical History:  Diagnosis Date  . Adult celiac disease   . Anemia associated with chronic renal failure 2017   blood transfusion last week 10/17  . Arthritis   . Asthma   . Cancer ([facility]) 2017   bladder  . Chronic kidney disease   . CKD (chronic kidney disease)    stage  IV kidney disease.  dr. Candiss Norse and dr. Holley Raring follow her  . COPD (chronic obstructive pulmonary disease) ([facility])   . Coronary artery disease   . Diabetes mellitus without complication ([facility])   . Dialysis patient ([facility])    Tues, Thurs, Sat  . Dyspnea    with exertion  . Elevated lipids   . GERD (gastroesophageal reflux disease)   . Hematuria   . Hypertension   . Lower back pain   . Neuropathy   .  Oxygen dependent    at hs  . Personal history of chemotherapy 2017   bladder ca  . Personal history of radiation therapy 2017   bladder ca  . Sleep apnea    uses cpap  . Urinary obstruction 01/2016    SOCIAL HX:  Social History   Tobacco Use  . Smoking status: Former Smoker    Types: Cigarettes    Quit date: 07/20/2002    Years since quitting: 17.4  . Smokeless tobacco: Never Used  Substance Use Topics  . Alcohol use: No    ALLERGIES:  Allergies  Allergen Reactions  . Glipizide Er Other (See Comments)    Hypoglycemia   . Percocet [Oxycodone-Acetaminophen] Hives     PERTINENT MEDICATIONS:  Outpatient Encounter Medications as of 12/14/2019  Medication Sig  . albuterol (PROVENTIL HFA;VENTOLIN HFA) 108 (90 Base) MCG/ACT inhaler Inhale 2 puffs into the lungs every 6 (six) hours as needed for wheezing or shortness of breath.   . Amino Acids-Protein Hydrolys (FEEDING SUPPLEMENT, PRO-STAT SUGAR FREE 64,) LIQD Place 30 mLs into feeding tube 2 (two) times daily.  Marland Kitchen amLODipine (NORVASC) 5 MG tablet Place 1 tablet (5 mg total) into feeding tube daily.  Marland Kitchen atorvastatin (LIPITOR) 40 MG tablet Place 40 mg into feeding tube at bedtime.   . B Complex-C (B-COMPLEX WITH VITAMIN C) tablet Place 1 tablet into feeding tube at bedtime.  . Blood Glucose Monitoring Suppl (ACCU-CHEK AVIVA PLUS) w/Device KIT (USE TO MONITOR BLOOD SUGARS.)  . Calcium Acetate 667 MG TABS Place 1,334 mg into feeding tube 3 (three) times daily.  . calcium carbonate (TUMS - DOSED IN MG ELEMENTAL CALCIUM) 500 MG chewable tablet Place 1 tablet into feeding tube at bedtime.   . cholecalciferol (VITAMIN D) 1000 units tablet Take 1 tablet (1,000 Units total) by mouth daily.  Marland Kitchen docusate sodium (COLACE) 100 MG capsule Take 1 capsule (100 mg total) by mouth daily as needed for mild constipation.  Marland Kitchen epoetin alfa (EPOGEN) 10000 UNIT/ML injection Inject 1 mL (10,000 Units total) into the vein every Monday, Wednesday, and Friday with  hemodialysis. (Patient not taking: Reported on 10/14/2019)  . esomeprazole (NEXIUM) 40 MG packet 40 mg daily before breakfast. Per tube  . ferrous sulfate 300 (60 Fe) MG/5ML syrup Place 324 mg into feeding tube at bedtime.  . gabapentin (NEURONTIN) 300 MG capsule Place 2 capsules (600 mg total) into feeding tube 3 (three) times daily.  . heparin 5000 UNIT/ML injection Inject 1 mL (5,000 Units total) into the skin every 12 (twelve) hours.  Marland Kitchen lacosamide 100 MG TABS Place 1 tablet (100 mg total) into feeding tube 2 (two) times daily.  . metoCLOPramide (REGLAN) 5 MG/5ML solution Place 5 mg into feeding tube every 8 (eight) hours.  . multivitamin (RENA-VIT) TABS tablet Place 1 tablet into feeding tube at bedtime.  . nitrofurantoin, macrocrystal-monohydrate, (MACROBID) 100 MG capsule Take 100 mg by mouth 2 (two) times daily.  . NON FORMULARY Place 1 Units into the nose at  bedtime. CPAP Time of use 2100-0600  . OXYGEN Inhale 2 L into the lungs at bedtime.  . traMADol (ULTRAM) 50 MG tablet Take 1 tablet (50 mg total) by mouth every 12 (twelve) hours as needed for moderate pain.   No facility-administered encounter medications on file as of 12/14/2019.    PHYSICAL EXAM / ROS:   deferred  Jason Coop, NP [facility]  COVID-19 PATIENT SCREENING TOOL  Person answering questions: ____________Staff_______ _____   1.  Is the patient or any family member in the home showing any signs or symptoms regarding respiratory infection?               Person with Symptom- __________NA_________________  a. Fever                                                                          Yes___ No___          ___________________  b. Shortness of breath                                                    Yes___ No___          ___________________ c. Cough/congestion                                       Yes___  No___         ___________________ d. Body aches/pains                                                          Yes___ No___        ____________________ e. Gastrointestinal symptoms (diarrhea, nausea)           Yes___ No___        ____________________  2. Within the past 14 days, has anyone living in the home had any contact with someone with or under investigation for COVID-19?    Yes___ No_X_   Person __________________

## 2020-01-09 ENCOUNTER — Encounter (INDEPENDENT_AMBULATORY_CARE_PROVIDER_SITE_OTHER): Payer: Medicare Other

## 2020-01-09 ENCOUNTER — Ambulatory Visit (INDEPENDENT_AMBULATORY_CARE_PROVIDER_SITE_OTHER): Payer: Medicare Other | Admitting: Nurse Practitioner

## 2020-01-21 ENCOUNTER — Encounter: Payer: Self-pay | Admitting: Emergency Medicine

## 2020-01-21 ENCOUNTER — Emergency Department: Payer: Medicare Other

## 2020-01-21 ENCOUNTER — Inpatient Hospital Stay
Admission: EM | Admit: 2020-01-21 | Discharge: 2020-01-25 | DRG: 100 | Disposition: A | Discharge status: Hospice | Payer: Medicare Other | Source: Skilled Nursing Facility | Attending: Internal Medicine | Admitting: Internal Medicine

## 2020-01-21 ENCOUNTER — Other Ambulatory Visit: Payer: Self-pay

## 2020-01-21 DIAGNOSIS — Z9981 Dependence on supplemental oxygen: Secondary | ICD-10-CM

## 2020-01-21 DIAGNOSIS — K219 Gastro-esophageal reflux disease without esophagitis: Secondary | ICD-10-CM | POA: Diagnosis present

## 2020-01-21 DIAGNOSIS — G9341 Metabolic encephalopathy: Secondary | ICD-10-CM | POA: Diagnosis present

## 2020-01-21 DIAGNOSIS — R627 Adult failure to thrive: Secondary | ICD-10-CM | POA: Diagnosis not present

## 2020-01-21 DIAGNOSIS — Z20822 Contact with and (suspected) exposure to covid-19: Secondary | ICD-10-CM | POA: Diagnosis present

## 2020-01-21 DIAGNOSIS — Z87891 Personal history of nicotine dependence: Secondary | ICD-10-CM

## 2020-01-21 DIAGNOSIS — I4891 Unspecified atrial fibrillation: Secondary | ICD-10-CM | POA: Diagnosis present

## 2020-01-21 DIAGNOSIS — I132 Hypertensive heart and chronic kidney disease with heart failure and with stage 5 chronic kidney disease, or end stage renal disease: Secondary | ICD-10-CM | POA: Diagnosis present

## 2020-01-21 DIAGNOSIS — G40201 Localization-related (focal) (partial) symptomatic epilepsy and epileptic syndromes with complex partial seizures, not intractable, with status epilepticus: Secondary | ICD-10-CM | POA: Diagnosis present

## 2020-01-21 DIAGNOSIS — R8290 Unspecified abnormal findings in urine: Secondary | ICD-10-CM | POA: Diagnosis not present

## 2020-01-21 DIAGNOSIS — Z96642 Presence of left artificial hip joint: Secondary | ICD-10-CM | POA: Diagnosis present

## 2020-01-21 DIAGNOSIS — L89151 Pressure ulcer of sacral region, stage 1: Secondary | ICD-10-CM | POA: Diagnosis present

## 2020-01-21 DIAGNOSIS — I2510 Atherosclerotic heart disease of native coronary artery without angina pectoris: Secondary | ICD-10-CM | POA: Diagnosis present

## 2020-01-21 DIAGNOSIS — Z515 Encounter for palliative care: Secondary | ICD-10-CM | POA: Diagnosis not present

## 2020-01-21 DIAGNOSIS — E785 Hyperlipidemia, unspecified: Secondary | ICD-10-CM | POA: Diagnosis present

## 2020-01-21 DIAGNOSIS — Z992 Dependence on renal dialysis: Secondary | ICD-10-CM

## 2020-01-21 DIAGNOSIS — Z9221 Personal history of antineoplastic chemotherapy: Secondary | ICD-10-CM

## 2020-01-21 DIAGNOSIS — L98429 Non-pressure chronic ulcer of back with unspecified severity: Secondary | ICD-10-CM | POA: Diagnosis present

## 2020-01-21 DIAGNOSIS — Z79899 Other long term (current) drug therapy: Secondary | ICD-10-CM

## 2020-01-21 DIAGNOSIS — E1122 Type 2 diabetes mellitus with diabetic chronic kidney disease: Secondary | ICD-10-CM | POA: Diagnosis present

## 2020-01-21 DIAGNOSIS — Z66 Do not resuscitate: Secondary | ICD-10-CM | POA: Diagnosis present

## 2020-01-21 DIAGNOSIS — E876 Hypokalemia: Secondary | ICD-10-CM

## 2020-01-21 DIAGNOSIS — I10 Essential (primary) hypertension: Secondary | ICD-10-CM | POA: Diagnosis present

## 2020-01-21 DIAGNOSIS — G40101 Localization-related (focal) (partial) symptomatic epilepsy and epileptic syndromes with simple partial seizures, not intractable, with status epilepticus: Secondary | ICD-10-CM | POA: Diagnosis not present

## 2020-01-21 DIAGNOSIS — E1129 Type 2 diabetes mellitus with other diabetic kidney complication: Secondary | ICD-10-CM | POA: Diagnosis present

## 2020-01-21 DIAGNOSIS — E43 Unspecified severe protein-calorie malnutrition: Secondary | ICD-10-CM | POA: Diagnosis present

## 2020-01-21 DIAGNOSIS — I5032 Chronic diastolic (congestive) heart failure: Secondary | ICD-10-CM | POA: Diagnosis present

## 2020-01-21 DIAGNOSIS — R4182 Altered mental status, unspecified: Secondary | ICD-10-CM | POA: Diagnosis present

## 2020-01-21 DIAGNOSIS — R569 Unspecified convulsions: Secondary | ICD-10-CM | POA: Diagnosis not present

## 2020-01-21 DIAGNOSIS — E872 Acidosis, unspecified: Secondary | ICD-10-CM | POA: Diagnosis present

## 2020-01-21 DIAGNOSIS — J449 Chronic obstructive pulmonary disease, unspecified: Secondary | ICD-10-CM | POA: Diagnosis present

## 2020-01-21 DIAGNOSIS — Z90710 Acquired absence of both cervix and uterus: Secondary | ICD-10-CM | POA: Diagnosis not present

## 2020-01-21 DIAGNOSIS — E1142 Type 2 diabetes mellitus with diabetic polyneuropathy: Secondary | ICD-10-CM | POA: Diagnosis present

## 2020-01-21 DIAGNOSIS — N189 Chronic kidney disease, unspecified: Secondary | ICD-10-CM | POA: Diagnosis present

## 2020-01-21 DIAGNOSIS — K900 Celiac disease: Secondary | ICD-10-CM | POA: Diagnosis present

## 2020-01-21 DIAGNOSIS — N2581 Secondary hyperparathyroidism of renal origin: Secondary | ICD-10-CM | POA: Diagnosis present

## 2020-01-21 DIAGNOSIS — D72829 Elevated white blood cell count, unspecified: Secondary | ICD-10-CM | POA: Diagnosis present

## 2020-01-21 DIAGNOSIS — Z8551 Personal history of malignant neoplasm of bladder: Secondary | ICD-10-CM

## 2020-01-21 DIAGNOSIS — R778 Other specified abnormalities of plasma proteins: Secondary | ICD-10-CM | POA: Diagnosis present

## 2020-01-21 DIAGNOSIS — Z923 Personal history of irradiation: Secondary | ICD-10-CM

## 2020-01-21 DIAGNOSIS — G40901 Epilepsy, unspecified, not intractable, with status epilepticus: Secondary | ICD-10-CM

## 2020-01-21 DIAGNOSIS — N186 End stage renal disease: Secondary | ICD-10-CM | POA: Diagnosis present

## 2020-01-21 DIAGNOSIS — Z7189 Other specified counseling: Secondary | ICD-10-CM

## 2020-01-21 DIAGNOSIS — L8990 Pressure ulcer of unspecified site, unspecified stage: Secondary | ICD-10-CM | POA: Diagnosis present

## 2020-01-21 DIAGNOSIS — Z931 Gastrostomy status: Secondary | ICD-10-CM

## 2020-01-21 DIAGNOSIS — Z6823 Body mass index (BMI) 23.0-23.9, adult: Secondary | ICD-10-CM

## 2020-01-21 DIAGNOSIS — D631 Anemia in chronic kidney disease: Secondary | ICD-10-CM | POA: Diagnosis present

## 2020-01-21 LAB — COMPREHENSIVE METABOLIC PANEL
ALT: <5 U/L (ref 0–44)
AST: 9 U/L — ABNORMAL LOW (ref 15–41)
Albumin: 1.7 g/dL — ABNORMAL LOW (ref 3.5–5.0)
Alkaline Phosphatase: 54 U/L (ref 38–126)
Anion gap: 7 (ref 5–15)
BUN: 19 mg/dL (ref 8–23)
CO2: 21 mmol/L — ABNORMAL LOW (ref 22–32)
Calcium: 6 mg/dL — CL (ref 8.9–10.3)
Chloride: 113 mmol/L — ABNORMAL HIGH (ref 98–111)
Creatinine, Ser: 3.34 mg/dL — ABNORMAL HIGH (ref 0.44–1.00)
GFR calc Af Amer: 14 mL/min — ABNORMAL LOW (ref >60)
GFR calc non Af Amer: 12 mL/min — ABNORMAL LOW (ref >60)
Glucose, Bld: 83 mg/dL (ref 70–99)
Potassium: 2.7 mmol/L — CL (ref 3.5–5.1)
Sodium: 141 mmol/L (ref 135–145)
Total Bilirubin: 0.4 mg/dL (ref 0.3–1.2)
Total Protein: 5.4 g/dL — ABNORMAL LOW (ref 6.5–8.1)

## 2020-01-21 LAB — CBC
HCT: 37.9 % (ref 36.0–46.0)
Hemoglobin: 11.2 g/dL — ABNORMAL LOW (ref 12.0–15.0)
MCH: 25.4 pg — ABNORMAL LOW (ref 26.0–34.0)
MCHC: 29.6 g/dL — ABNORMAL LOW (ref 30.0–36.0)
MCV: 85.9 fL (ref 80.0–100.0)
Platelets: 199 10*3/uL (ref 150–400)
RBC: 4.41 MIL/uL (ref 3.87–5.11)
RDW: 17.5 % — ABNORMAL HIGH (ref 11.5–15.5)
WBC: 11.9 10*3/uL — ABNORMAL HIGH (ref 4.0–10.5)
nRBC: 0 % (ref 0.0–0.2)

## 2020-01-21 LAB — URINALYSIS, COMPLETE (UACMP) WITH MICROSCOPIC
Bacteria, UA: NONE SEEN
Bilirubin Urine: NEGATIVE
Glucose, UA: 50 mg/dL — AB
Ketones, ur: NEGATIVE mg/dL
Nitrite: NEGATIVE
Protein, ur: 100 mg/dL — AB
RBC / HPF: >50 RBC/hpf — ABNORMAL HIGH (ref 0–5)
Specific Gravity, Urine: 1.005 (ref 1.005–1.030)
WBC, UA: >50 WBC/hpf — ABNORMAL HIGH (ref 0–5)
pH: 9 — ABNORMAL HIGH (ref 5.0–8.0)

## 2020-01-21 LAB — TROPONIN I (HIGH SENSITIVITY)
Troponin I (High Sensitivity): 27 ng/L — ABNORMAL HIGH (ref <18)
Troponin I (High Sensitivity): 36 ng/L — ABNORMAL HIGH (ref <18)

## 2020-01-21 LAB — PHOSPHORUS: Phosphorus: 1.2 mg/dL — ABNORMAL LOW (ref 2.5–4.6)

## 2020-01-21 LAB — HEMOGLOBIN A1C
Hgb A1c MFr Bld: 5 % (ref 4.8–5.6)
Mean Plasma Glucose: 96.8 mg/dL

## 2020-01-21 LAB — GLUCOSE, CAPILLARY
Glucose-Capillary: 103 mg/dL — ABNORMAL HIGH (ref 70–99)
Glucose-Capillary: 120 mg/dL — ABNORMAL HIGH (ref 70–99)

## 2020-01-21 LAB — LIPASE, BLOOD: Lipase: 15 U/L (ref 11–51)

## 2020-01-21 MED ORDER — SODIUM CHLORIDE 0.9 % IV SOLN
75.0000 mL/h | INTRAVENOUS | Status: DC
  Administered 2020-01-21 – 2020-01-22 (×2): 75 mL/h via INTRAVENOUS

## 2020-01-21 MED ORDER — SENNA 8.6 MG PO TABS: 1.0000 | ORAL_TABLET | Freq: Two times a day (BID) | ORAL | Status: DC

## 2020-01-21 MED ORDER — HEPARIN SODIUM (PORCINE) 5000 UNIT/ML IJ SOLN
5000.0000 [IU] | Freq: Three times a day (TID) | INTRAMUSCULAR | Status: DC
  Administered 2020-01-21 – 2020-01-22 (×3): 5000 [IU] via SUBCUTANEOUS
  Filled 2020-01-21 (×3): qty 1

## 2020-01-21 MED ORDER — LORAZEPAM 2 MG/ML IJ SOLN
2.0000 mg | INTRAMUSCULAR | Status: DC | PRN
  Administered 2020-01-22 – 2020-01-25 (×5): 2 mg via INTRAVENOUS
  Filled 2020-01-21 (×5): qty 1

## 2020-01-21 MED ORDER — BISACODYL 5 MG PO TBEC: 5.0000 mg | DELAYED_RELEASE_TABLET | Freq: Every day | ORAL | Status: DC | PRN

## 2020-01-21 MED ORDER — INSULIN ASPART 100 UNIT/ML ~~LOC~~ SOLN: 0.0000 [IU] | Freq: Three times a day (TID) | SUBCUTANEOUS | Status: DC

## 2020-01-21 MED ORDER — SODIUM CHLORIDE 0.9 % IV SOLN
150.0000 mg | INTRAVENOUS | Status: AC
  Administered 2020-01-21: 150 mg via INTRAVENOUS
  Filled 2020-01-21: qty 15

## 2020-01-21 MED ORDER — ACETAMINOPHEN 325 MG PO TABS: 650.0000 mg | ORAL_TABLET | Freq: Four times a day (QID) | ORAL | Status: DC | PRN

## 2020-01-21 MED ORDER — HYDRALAZINE HCL 20 MG/ML IJ SOLN
10.0000 mg | INTRAMUSCULAR | Status: DC | PRN
  Administered 2020-01-22: 10 mg via INTRAVENOUS
  Filled 2020-01-21: qty 1

## 2020-01-21 MED ORDER — ACETAMINOPHEN 650 MG RE SUPP: 650.0000 mg | Freq: Four times a day (QID) | RECTAL | Status: DC | PRN

## 2020-01-21 NOTE — ED Notes (Signed)
Stage 1 skin breakdown noted on sacrum.

## 2020-01-21 NOTE — ED Notes (Signed)
Pt transported to room by this RN on stretcher with seizure pads and monitor at this time.

## 2020-01-21 NOTE — Progress Notes (Signed)
This RN reviewed patient's vital signs and is concerned about patient's MEWS score, specifically her LOC of 2. Spoke with current primary RN regarding this who stated that patient is currently unresponsive. Admitting MD paged. Awaiting response.

## 2020-01-21 NOTE — Consult Note (Signed)
No major DDIs with AEDs. Pt received one dose of vimpat in the ED.

## 2020-01-21 NOTE — Consult Note (Signed)
Reason for Consult:Seizure Requesting Physician: Quale  CC: Seizure  I have been asked by Dr. Jacqualine Code to see this patient in consultation for seizure.  HPI: Barbara [name] is an 79 y.o. female who is unable to provide any history.  History from facility is minimal.  Patient with a history of multiple medical problems including ESRD on HD MWF, hyperlipidemia, type 2 diabetes, COPD, asthma, GERD, bladder cancer, multiple episodes of altered mental status and seizure disorder who presents from her facility after being noted to have 3 episodes of seizure today.  Description not available.  Patient on Vimpat.  Last dose was last evening.  Received Ativan at the facility.  Now poorly responsive.   Patient has been seen by neurology multiple times over the past year for altered mental status, all of which have been prolonged.  Has had multiple work ups for seizure as well, all of which have been unremarkable.  Per notes since last discharge patient has continued to go downhill, has been declining in alertness and function but did seem to be eating more with assistance.  Continued with episodes of hypoactive delirium.  Husband was in conversation with Palliative Care.    Past Medical History:  Diagnosis Date  . Adult celiac disease   . Anemia associated with chronic renal failure 2017   blood transfusion last week 10/17  . Arthritis   . Asthma   . Cancer ([place]) 2017   bladder  . Chronic kidney disease   . 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  dr. Candiss Norse and dr. Holley Raring follow her  . COPD (chronic obstructive pulmonary disease) ([place])   . Coronary artery disease   . Diabetes mellitus without complication ([place])   . Dialysis patient ([place])    Tues, Thurs, Sat  . Dyspnea    with exertion  . Elevated lipids   . GERD (gastroesophageal reflux disease)   . Hematuria   . Hypertension   . Lower back pain   . Neuropathy   . Oxygen dependent    at hs  . Personal history of chemotherapy  2017   bladder ca  . Personal history of radiation therapy 2017   bladder ca  . Sleep apnea    uses cpap  . Urinary obstruction 01/2016    Past Surgical History:  Procedure Laterality Date  . A/V SHUNT INTERVENTION N/A 05/12/2018   Procedure: A/V SHUNT INTERVENTION;  Surgeon: Algernon Huxley, MD;  Location: [facility];  Service: Cardiovascular;  Laterality: N/A;  . A/V SHUNT INTERVENTION Left 07/29/2018   Procedure: A/V SHUNT INTERVENTION;  Surgeon: Katha Cabal, MD;  Location: [facility];  Service: Cardiovascular;  Laterality: Left;  . ABDOMINAL HYSTERECTOMY    . AV FISTULA PLACEMENT Left 02/17/2017   Procedure: INSERTION OF ARTERIOVENOUS (AV) GORE-TEX GRAFT ARM(BRACHIAL AXILLARY);  Surgeon: Katha Cabal, MD;  Location: [facility];  Service: Vascular;  Laterality: Left;  . CYSTOSCOPY W/ RETROGRADES Bilateral 02/17/2016   Procedure: CYSTOSCOPY WITH RETROGRADE PYELOGRAM;  Surgeon: Hollice Espy, MD;  Location: [facility];  Service: Urology;  Laterality: Bilateral;  . CYSTOSCOPY W/ RETROGRADES Bilateral 10/12/2016   Procedure: CYSTOSCOPY WITH RETROGRADE PYELOGRAM;  Surgeon: Hollice Espy, MD;  Location: [facility];  Service: Urology;  Laterality: Bilateral;  . CYSTOSCOPY W/ RETROGRADES Bilateral 06/09/2017   Procedure: CYSTOSCOPY WITH RETROGRADE PYELOGRAM;  Surgeon: Hollice Espy, MD;  Location: [facility];  Service: Urology;  Laterality: Bilateral;  . CYSTOSCOPY W/ RETROGRADES Bilateral  10/07/2017   Procedure: CYSTOSCOPY WITH RETROGRADE PYELOGRAM;  Surgeon: Hollice Espy, MD;  Location: [facility];  Service: Urology;  Laterality: Bilateral;  . CYSTOSCOPY W/ URETERAL STENT PLACEMENT Left 05/12/2016   Procedure: CYSTOSCOPY WITH STENT REPLACEMENT;  Surgeon: Hollice Espy, MD;  Location: [facility];  Service: Urology;  Laterality: Left;  . CYSTOSCOPY W/ URETERAL STENT PLACEMENT Left 10/12/2016   Procedure: CYSTOSCOPY WITH STENT REPLACEMENT;  Surgeon: Hollice Espy, MD;   Location: [facility];  Service: Urology;  Laterality: Left;  . CYSTOSCOPY WITH BIOPSY N/A 06/09/2017   Procedure: CYSTOSCOPY WITH BLADDER BIOPSY;  Surgeon: Hollice Espy, MD;  Location: [facility];  Service: Urology;  Laterality: N/A;  . CYSTOSCOPY WITH STENT PLACEMENT Left 01/21/2016   Procedure: CYSTOSCOPY WITH double J STENT PLACEMENT;  Surgeon: Franchot Gallo, MD;  Location: [facility];  Service: Urology;  Laterality: Left;  . CYSTOSCOPY WITH STENT PLACEMENT Right 10/12/2016   Procedure: CYSTOSCOPY WITH STENT PLACEMENT;  Surgeon: Hollice Espy, MD;  Location: [facility];  Service: Urology;  Laterality: Right;  . DIALYSIS/PERMA CATHETER INSERTION N/A 11/20/2016   Procedure: Dialysis/Perma Catheter Insertion;  Surgeon: Algernon Huxley, MD;  Location: [facility];  Service: Cardiovascular;  Laterality: N/A;  . DIALYSIS/PERMA CATHETER REMOVAL N/A 03/30/2017   Procedure: DIALYSIS/PERMA CATHETER REMOVAL;  Surgeon: Katha Cabal, MD;  Location: [place] [facility];  Service: Cardiovascular;  Laterality: N/A;  . ESOPHAGOGASTRODUODENOSCOPY (EGD) WITH PROPOFOL N/A 09/28/2019   Procedure: ESOPHAGOGASTRODUODENOSCOPY (EGD) WITH PROPOFOL;  Surgeon: Toledo, Benay Pike, MD;  Location: [facility];  Service: Gastroenterology;  Laterality: N/A;  . HIP CLOSED REDUCTION Left 10/14/2019   Procedure: CLOSED REDUCTION HIP;  Surgeon: Leim Fabry, MD;  Location: [facility];  Service: Orthopedics;  Laterality: Left;  . KIDNEY SURGERY  01/21/2016   IR NEPHROSTOMY PLACEMENT LEFT   . PEG PLACEMENT N/A 09/20/2019   Procedure: PERCUTANEOUS ENDOSCOPIC GASTROSTOMY (PEG) PLACEMENT;  Surgeon: Toledo, Benay Pike, MD;  Location: [facility];  Service: Gastroenterology;  Laterality: N/A;  . PERIPHERAL VASCULAR CATHETERIZATION N/A 04/07/2016   Procedure: Glori Luis Cath Insertion;  Surgeon: Katha Cabal, MD;  Location: [facility];  Service: Cardiovascular;  Laterality: N/A;  . PERIPHERAL VASCULAR THROMBECTOMY Left  06/02/2017   Procedure: PERIPHERAL VASCULAR THROMBECTOMY;  Surgeon: Algernon Huxley, MD;  Location: [facility] [place] [facility];  Service: Cardiovascular;  Laterality: Left;  . PERIPHERAL VASCULAR THROMBECTOMY Left 01/28/2018   Procedure: PERIPHERAL VASCULAR THROMBECTOMY;  Surgeon: Katha Cabal, MD;  Location: [place] [facility];  Service: Cardiovascular;  Laterality: Left;  . PERIPHERAL VASCULAR THROMBECTOMY Left 03/14/2018   Procedure: PERIPHERAL VASCULAR THROMBECTOMY;  Surgeon: Algernon Huxley, MD;  Location: [facility] [facility];  Service: Cardiovascular;  Laterality: Left;  . PERIPHERAL VASCULAR THROMBECTOMY Left 03/16/2018   Procedure: PERIPHERAL VASCULAR THROMBECTOMY;  Surgeon: Katha Cabal, MD;  Location: [facility] [place] [facility];  Service: Cardiovascular;  Laterality: Left;  . PORTA CATH REMOVAL N/A 02/07/2019   Procedure: PORTA CATH REMOVAL;  Surgeon: Katha Cabal, MD;  Location: [facility];  Service: Cardiovascular;  Laterality: N/A;  . PORTACATH PLACEMENT Right   . ROTATOR CUFF REPAIR Left   . TEMPORARY DIALYSIS CATHETER N/A 05/11/2018   Procedure: TEMPORARY DIALYSIS CATHETER;  Surgeon: Katha Cabal, MD;  Location: [facility];  Service: Cardiovascular;  Laterality: N/A;  . TEMPORARY DIALYSIS CATHETER Left 07/27/2018   Procedure: TEMPORARY DIALYSIS CATHETER;  Surgeon: Katha Cabal, MD;  Location: [facility];  Service: Cardiovascular;  Laterality: Left;  . TOTAL HIP ARTHROPLASTY Left 09/05/2019   Procedure: TOTAL HIP ARTHROPLASTY;  Surgeon: Corky Mull, MD;  Location: [facility];  Service: Orthopedics;  Laterality: Left;  . TRANSURETHRAL RESECTION OF BLADDER TUMOR N/A 02/17/2016   Procedure: TRANSURETHRAL RESECTION OF BLADDER TUMOR (TURBT)-LARGE;  Surgeon: Hollice Espy, MD;  Location: [facility];  Service: Urology;  Laterality: N/A;  . TRANSURETHRAL RESECTION OF BLADDER TUMOR N/A 10/07/2017   Procedure: TRANSURETHRAL RESECTION OF BLADDER TUMOR  (TURBT)-small;  Surgeon: Hollice Espy, MD;  Location: [facility];  Service: Urology;  Laterality: N/A;  . URETEROSCOPY Left 02/17/2016   Procedure: URETEROSCOPY;  Surgeon: Hollice Espy, MD;  Location: [facility];  Service: Urology;  Laterality: Left;  . URETEROSCOPY Right 10/12/2016   Procedure: URETEROSCOPY;  Surgeon: Hollice Espy, MD;  Location: [facility];  Service: Urology;  Laterality: Right;    Family History  Problem Relation Age of Onset  . Diabetes Mother   . Cancer Father   . Breast cancer Neg Hx   . Kidney disease Neg Hx     Social History:  reports that she quit smoking about 17 years ago. Her smoking use included cigarettes. She has never used smokeless tobacco. She reports that she does not drink alcohol and does not use drugs.  Allergies  Allergen Reactions  . Glipizide Er Other (See Comments)    Hypoglycemia   . Percocet [Oxycodone-Acetaminophen] Hives    Medications: I have reviewed the patient's current medications. Prior to Admission medications   Medication Sig Start Date End Date Taking? Authorizing Provider  albuterol (PROVENTIL HFA;VENTOLIN HFA) 108 (90 Base) MCG/ACT inhaler Inhale 2 puffs into the lungs every 6 (six) hours as needed for wheezing or shortness of breath.  05/16/18   [provider]  Amino Acids-Protein Hydrolys (FEEDING SUPPLEMENT, PRO-STAT SUGAR FREE 64,) LIQD Place 30 mLs into feeding tube 2 (two) times daily. 09/24/19   Nolberto Hanlon, MD  amLODipine (NORVASC) 5 MG tablet Place 1 tablet (5 mg total) into feeding tube daily. 09/24/19   Nolberto Hanlon, MD  atorvastatin (LIPITOR) 40 MG tablet Place 40 mg into feeding tube at bedtime.  05/01/19   [provider]  B Complex-C (B-COMPLEX WITH VITAMIN C) tablet Place 1 tablet into feeding tube at bedtime. 09/24/19   Nolberto Hanlon, MD  Blood Glucose Monitoring Suppl (ACCU-CHEK AVIVA PLUS) w/Device KIT (USE TO MONITOR BLOOD SUGARS.) 12/30/17   [provider]  Calcium Acetate 667 MG  TABS Place 1,334 mg into feeding tube 3 (three) times daily.    [provider]  calcium carbonate (TUMS - DOSED IN MG ELEMENTAL CALCIUM) 500 MG chewable tablet Place 1 tablet into feeding tube at bedtime.     [provider]  cholecalciferol (VITAMIN D) 1000 units tablet Take 1 tablet (1,000 Units total) by mouth daily. 09/24/19   Nolberto Hanlon, MD  docusate sodium (COLACE) 100 MG capsule Take 1 capsule (100 mg total) by mouth daily as needed for mild constipation. 10/17/19   Dhungel, Flonnie Overman, MD  epoetin alfa (EPOGEN) 10000 UNIT/ML injection Inject 1 mL (10,000 Units total) into the vein every Monday, Wednesday, and Friday with hemodialysis. Patient not taking: Reported on 10/14/2019 09/25/19   Nolberto Hanlon, MD  esomeprazole (NEXIUM) 40 MG packet 40 mg daily before breakfast. Per tube    [provider]  ferrous sulfate 300 (60 Fe) MG/5ML syrup Place 324 mg into feeding tube at bedtime.    [provider]  gabapentin (NEURONTIN) 300 MG capsule Place  2 capsules (600 mg total) into feeding tube 3 (three) times daily. 09/24/19   Nolberto Hanlon, MD  heparin 5000 UNIT/ML injection Inject 1 mL (5,000 Units total) into the skin every 12 (twelve) hours. 09/24/19   Nolberto Hanlon, MD  lacosamide 100 MG TABS Place 1 tablet (100 mg total) into feeding tube 2 (two) times daily. 09/24/19   Nolberto Hanlon, MD  metoCLOPramide (REGLAN) 5 MG/5ML solution Place 5 mg into feeding tube every 8 (eight) hours.    [provider]  multivitamin (RENA-VIT) TABS tablet Place 1 tablet into feeding tube at bedtime. 09/24/19   Nolberto Hanlon, MD  nitrofurantoin, macrocrystal-monohydrate, (MACROBID) 100 MG capsule Take 100 mg by mouth 2 (two) times daily.    [provider]  NON FORMULARY Place 1 Units into the nose at bedtime. CPAP Time of use 2100-0600    [provider]  OXYGEN Inhale 2 L into the lungs at bedtime.    [provider]  traMADol (ULTRAM) 50 MG tablet Take 1  tablet (50 mg total) by mouth every 12 (twelve) hours as needed for moderate pain. 10/17/19   Dhungel, Nishant, MD    ROS: Unable to obtain  Physical Examination: Blood pressure (!) 187/92, pulse (!) 108, temperature 98.6 F (37 C), temperature source Axillary, resp. rate 19, weight 67.3 kg, SpO2 96 %.  HEENT-  Normocephalic.  Normal external eye and conjunctiva. Normal external ears. Normal external nose.  Lip swollen Cardiovascular- S1, S2 normal, pulses palpable throughout   Lungs- chest clear, no wheezing, rales, normal symmetric air entry Abdomen- soft, non-tender; bowel sounds normal; no masses,  no organomegaly Extremities- no edema Lymph-no adenopathy palpable Musculoskeletal-no joint tenderness, deformity or swelling Skin-warm and dry, no hyperpigmentation, vitiligo, or suspicious lesions  Neurological Examination   Mental Status: In bed with eyes closed.  Opens eyes with sternal rub and looks at examiner with grimace noted.  Does not follow commands.  Patient grunts but no speech.   Cranial Nerves: II: Pupils equal, round, reactive to light and accommodation.  Blinks to bilateral confrontation III,IV,VI: Oculocephalic response present bilaterally.  V,VII: corneal reflex present bilaterally  VIII: patient does not respond to verbal stimuli IX,X: gag reflex unable to be tested, XI: trapezius strength unable to test bilaterally XII: tongue strength unable to test Motor: Arms drop back to bed when lifted.  Withdrawal noted in the lower extremities with light stimulation.   Sensory: Does not respond to noxious stimuli in the upper extremities Deep Tendon Reflexes:  Symmetric throughout. Plantars: Mute bilaterally Cerebellar: Unable to perform due to safety concerns   Laboratory Studies:   Basic Metabolic Panel: No results for input(s): NA, K, CL, CO2, GLUCOSE, BUN, CREATININE, CALCIUM, MG, PHOS in the last 168 hours.  Liver Function Tests: No results for input(s):  AST, ALT, ALKPHOS, BILITOT, PROT, ALBUMIN in the last 168 hours. No results for input(s): LIPASE, AMYLASE in the last 168 hours. No results for input(s): AMMONIA in the last 168 hours.  CBC: Recent Labs  Lab 01/21/20 1109  WBC 11.9*  HGB 11.2*  HCT 37.9  MCV 85.9  PLT 199    Cardiac Enzymes: No results for input(s): CKTOTAL, CKMB, CKMBINDEX, TROPONINI in the last 168 hours.  BNP: Invalid input(s): POCBNP  CBG: No results for input(s): GLUCAP in the last 168 hours.  Microbiology: Results for orders placed or performed during the hospital encounter of 10/13/19  Respiratory Panel by RT PCR (Flu A&B, Covid) - Nasopharyngeal Swab  Status: None   Collection Time: 10/13/19 10:09 PM   Specimen: Nasopharyngeal Swab  Result Value Ref Range Status   SARS Coronavirus 2 by RT PCR NEGATIVE NEGATIVE Final    Comment: (NOTE) SARS-CoV-2 target nucleic acids are NOT DETECTED. The SARS-CoV-2 RNA is generally detectable in upper respiratoy specimens during the acute phase of infection. The lowest concentration of SARS-CoV-2 viral copies this assay can detect is 131 copies/mL. A negative result does not preclude SARS-Cov-2 infection and should not be used as the sole basis for treatment or other patient management decisions. A negative result may occur with  improper specimen collection/handling, submission of specimen other than nasopharyngeal swab, presence of viral mutation(s) within the areas targeted by this assay, and inadequate number of viral copies (<131 copies/mL). A negative result must be combined with clinical observations, patient history, and epidemiological information. The expected result is Negative. Fact Sheet for Patients:  PinkCheek.be Fact Sheet for Healthcare Providers:  GravelBags.it This test is not yet ap proved or cleared by the [place] FDA and  has been authorized for detection and/or diagnosis  of SARS-CoV-2 by FDA under an Emergency Use Authorization (EUA). This EUA will remain  in effect (meaning this test can be used) for the duration of the COVID-19 declaration under Section 564(b)(1) of the Act, 21 U.S.C. section 360bbb-3(b)(1), unless the authorization is terminated or revoked sooner.    Influenza A by PCR NEGATIVE NEGATIVE Final   Influenza B by PCR NEGATIVE NEGATIVE Final    Comment: (NOTE) The Xpert Xpress SARS-CoV-2/FLU/RSV assay is intended as an aid in  the diagnosis of influenza from Nasopharyngeal swab specimens and  should not be used as a sole basis for treatment. Nasal washings and  aspirates are unacceptable for Xpert Xpress SARS-CoV-2/FLU/RSV  testing. Fact Sheet for Patients: PinkCheek.be Fact Sheet for Healthcare Providers: GravelBags.it This test is not yet approved or cleared by the [place] FDA and  has been authorized for detection and/or diagnosis of SARS-CoV-2 by  FDA under an Emergency Use Authorization (EUA). This EUA will remain  in effect (meaning this test can be used) for the duration of the  Covid-19 declaration under Section 564(b)(1) of the Act, 21  U.S.C. section 360bbb-3(b)(1), unless the authorization is  terminated or revoked. Performed at [facility], [address] [place], [place] [address]   Surgical pcr screen     Status: Abnormal   Collection Time: 10/13/19 11:25 PM   Specimen: Nasal Mucosa; Nasal Swab  Result Value Ref Range Status   MRSA, PCR POSITIVE (A) NEGATIVE Final    Comment: RESULT CALLED TO, READ BACK BY AND VERIFIED WITH: TERESA COBLE ON 10/14/19 AT 0147 [facility]    Staphylococcus aureus POSITIVE (A) NEGATIVE Final    Comment: (NOTE) The Xpert SA Assay (FDA approved for NASAL specimens in patients 75 years of age and older), is one component of a comprehensive surveillance program. It is not intended to diagnose infection nor to guide or  monitor treatment. Performed at [facility], [place], [place] [address]   SARS CORONAVIRUS 2 (TAT 6-24 HRS) Nasopharyngeal Nasopharyngeal Swab     Status: None   Collection Time: 10/16/19  1:56 PM   Specimen: Nasopharyngeal Swab  Result Value Ref Range Status   SARS Coronavirus 2 NEGATIVE NEGATIVE Final    Comment: (NOTE) SARS-CoV-2 target nucleic acids are NOT DETECTED. The SARS-CoV-2 RNA is generally detectable in upper and lower respiratory specimens during the acute phase of infection.  Negative results do not preclude SARS-CoV-2 infection, do not rule out co-infections with other pathogens, and should not be used as the sole basis for treatment or other patient management decisions. Negative results must be combined with clinical observations, patient history, and epidemiological information. The expected result is Negative. Fact Sheet for Patients: SugarRoll.be Fact Sheet for Healthcare Providers: https://www.woods-mathews.com/ This test is not yet approved or cleared by the [place] FDA and  has been authorized for detection and/or diagnosis of SARS-CoV-2 by FDA under an Emergency Use Authorization (EUA). This EUA will remain  in effect (meaning this test can be used) for the duration of the COVID-19 declaration under Section 56 4(b)(1) of the Act, 21 U.S.C. section 360bbb-3(b)(1), unless the authorization is terminated or revoked sooner. Performed at [facility], [place] [address], [place], [place] [address]     Coagulation Studies: No results for input(s): LABPROT, INR in the last 72 hours.  Urinalysis: No results for input(s): COLORURINE, LABSPEC, PHURINE, GLUCOSEU, HGBUR, BILIRUBINUR, KETONESUR, PROTEINUR, UROBILINOGEN, NITRITE, LEUKOCYTESUR in the last 168 hours.  Invalid input(s): APPERANCEUR  Lipid Panel:     Component Value Date/Time   CHOL 100 08/03/2012 0250   TRIG 235 (H)  08/03/2012 0250   HDL 31 (L) 08/03/2012 0250   VLDL 47 (H) 08/03/2012 0250   LDLCALC 22 08/03/2012 0250    HgbA1C:  Lab Results  Component Value Date   HGBA1C 5.7 (H) 10/03/2019    Urine Drug Screen:  No results found for: LABOPIA, COCAINSCRNUR, LABBENZ, AMPHETMU, THCU, LABBARB  Alcohol Level: No results for input(s): ETH in the last 168 hours.  Other results: EKG: sinus tachycardia at 105 bpm.  Imaging: CT Head Wo Contrast  Result Date: 01/21/2020 CLINICAL DATA:  Seizure and abnormal neurologic exam. Three seizures this morning. EXAM: CT HEAD WITHOUT CONTRAST TECHNIQUE: Contiguous axial images were obtained from the base of the skull through the vertex without intravenous contrast. COMPARISON:  05/12/2019 FINDINGS: Brain: Ventricles, cisterns and other CSF spaces are normal. There is moderate chronic ischemic microvascular disease. Minimal age related atrophic change. No mass, mass effect, shift of midline structures or acute hemorrhage. No evidence of acute infarction. Vascular: No hyperdense vessel or unexpected calcification. Skull: Normal. Negative for fracture or focal lesion. Sinuses/Orbits: No acute finding. Other: None. IMPRESSION: 1.  No acute findings. 2. Chronic ischemic microvascular disease and minimal age related atrophic change. Electronically Signed   By: Marin Olp M.D.   On: 01/21/2020 11:36     Assessment/Plan: 79 y.o. female who is unable to provide any history.  History from facility is minimal.  Patient with a history of multiple medical problems including ESRD on HD MWF, hyperlipidemia, type 2 diabetes, COPD, asthma, GERD, bladder cancer, multiple episodes of altered mental status and seizure disorder who presents from her facility after being noted to have 3 episodes of seizure today.  Description not available.  Patient on Vimpat.  Last dose was last evening.  Received Ativan at the facility.  Now poorly responsive.  Head CT personally reviewed and shows no acute  changes.   Patient has been seen by neurology multiple times over the past year for altered mental status, all of which have been prolonged.  Has had multiple work ups for seizure as well, all of which have been unremarkable.  Per notes since last discharge patient has continued to go downhill, has been declining in alertness and function but did seem to be eating more with assistance.  Continued with episodes  of hypoactive delirium.   Recommendations: 1. Vimpat 174m IV now.  To continue at 1567mBID starting this evening 2. Seizure precautions 3. EEG 4. Agree with addressing metabolic/infectious issues.   5. Serum magnesium and phosphorus  LeAlexis GoodellMD Neurology [phone]/10/2019, 12:19 PM

## 2020-01-21 NOTE — ED Triage Notes (Signed)
Arrived from [facility]. Pt has had 3 seizures this AM there.  MOST form with pt.  EMS reports possible 1 mg ativan by nursing facility.  CBG 136 with EMS.  Pt nonverbal at bedside. Dialysis pt, last received Friday. Left arm access for dialysis.  Pt will normally look at you and smile per EMS report from nursing facility. Lip swollen, pt bit lip during seizure.

## 2020-01-21 NOTE — ED Notes (Addendum)
Attempted to call report. Floor states they cannot take due to MEWS score and charge states she will write to MD to upgrade pt. ED charge notified.

## 2020-01-21 NOTE — ED Provider Notes (Signed)
[facility] Emergency Department Provider Note  ____________________________________________   First MD Initiated Contact with Patient 01/21/20 1059     (approximate)  I have reviewed the triage vital signs and the nursing notes.   HISTORY  Chief Complaint Seizures  EM caveat: Patient altered mental status  HPI Barbara [name] is a 79 y.o. female here for evaluation for possibly 3 seizures earlier today  Patient reportedly had possibly 3 seizures, history is not very clear, EMS reports the facility was not able to give him a good or clear description but possibly provided the patient 1 mg of Ativan at about 10 AM.  She is potentially bit her tongue.  Has a history of seizure disorder.  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  dr. Candiss Norse and dr. Holley Raring follow her   COPD (chronic obstructive pulmonary disease) ([place])    Coronary artery disease    Diabetes mellitus without complication [facility])    Dialysis patient ([place])    Tues, Thurs, Sat   Dyspnea    with exertion   Elevated lipids    GERD (gastroesophageal reflux disease)    Hematuria    Hypertension    Lower back pain    Neuropathy    Oxygen dependent    at hs   Personal history of chemotherapy 2017   bladder ca   Personal history of radiation therapy 2017   bladder ca   Sleep apnea    uses cpap   Urinary obstruction 01/2016    Patient Active Problem List   Diagnosis Date Noted   Pressure injury of skin 10/14/2019   Anterior dislocation of left hip ([place]) 10/13/2019   Acute GI bleeding 09/28/2019   Acute respiratory failure with hypoxia (HCC)  09/28/2019   Chronic diastolic CHF (congestive heart failure) ([place]) 09/28/2019   Seizure ([place]) 09/28/2019   PEG tube malfunction ([place]) 09/28/2019   Sacral ulcer ([place]) 09/23/2019   Dysphagia 09/19/2019   Acute encephalopathy    Goals of care, counseling/discussion    Aspiration pneumonia ([place]) 09/11/2019   Status post total hip replacement, left 09/05/2019   Type II diabetes mellitus with renal manifestations ([place]) 09/05/2019   ESRD on dialysis ([place]) 09/05/2019   Bacteremia due to other bacteria    Bacteroides fragilis infection    Acute metabolic encephalopathy    Somnolence    Lobar pneumonia ([place]) 05/11/2019   Dialysis patient ([place])    Primary osteoarthritis of left hip 68/34/1962   Metabolic acidosis 22/97/9892   Complication from renal dialysis device 01/28/2018   History of colonic polyps 07/30/2017   Presence of cardiac and vascular implant and graft    Fever    Sepsis ([place])    ESRD (end stage renal disease) ([place]) 02/01/2017   Sepsis secondary to UTI ([place]) 01/24/2017   Acute kidney injury ([place]) 11/19/2016   Anemia, chronic renal failure, stage 4 (severe) ([place]) 09/17/2016   Hypoglycemia 06/04/2016   Hyperkalemia 05/12/2016   Protein-calorie malnutrition, severe 05/05/2016   Acute on chronic renal failure ([place]) 05/04/2016   Convulsions ([place]) 04/17/2016  Palliative care by specialist    DNR (do not resuscitate) discussion    C. 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  difficile colitis 01/22/2016   Urinary obstruction 01/21/2016   COPD (chronic obstructive pulmonary disease) ([place]) 01/21/2016   Controlled type 2 diabetes mellitus with stage 4 chronic kidney disease, with long-term current use of insulin ([place]) 01/21/2016   Essential hypertension 01/21/2016   Acute renal failure superimposed on stage 4 chronic kidney disease ([place]) 01/20/2016   Leukocytosis 01/20/2016   Anemia in chronic kidney disease 01/20/2016   Arthritis of knee, degenerative 05/10/2015   Knee strain 03/26/2015   Other intervertebral disc displacement, lumbar region 03/04/2015   Degenerative arthritis of lumbar spine 03/04/2015   HNP (herniated nucleus pulposus), lumbar 03/04/2015   Osteoarthritis of spine with radiculopathy, lumbar region 03/04/2015   Injury of tendon of upper extremity 11/12/2014   Atherosclerosis of abdominal aorta ([place]) 11/02/2014   Chronic kidney disease, stage IV (severe) ([place]) 11/02/2014   Obstructive apnea 11/02/2014   Complete rotator cuff rupture of left shoulder 10/05/2014   Arthritis of shoulder region, degenerative 10/05/2014   CAD in native artery 12/08/2013   Benign essential HTN 12/08/2013   Type 2 diabetes mellitus ([place]) 12/08/2013    Past Surgical History:  Procedure Laterality Date   A/V SHUNT INTERVENTION N/A 05/12/2018   Procedure: A/V SHUNT INTERVENTION;  Surgeon: Algernon Huxley, MD;  Location: [place] [facility];  Service: Cardiovascular;  Laterality: N/A;   A/V SHUNT INTERVENTION Left 07/29/2018   Procedure: A/V SHUNT INTERVENTION;  Surgeon: Katha Cabal, MD;  Location: [place] [facility];  Service: Cardiovascular;  Laterality: Left;   ABDOMINAL HYSTERECTOMY     AV FISTULA PLACEMENT Left 02/17/2017   Procedure: INSERTION OF ARTERIOVENOUS (AV) GORE-TEX GRAFT ARM(BRACHIAL AXILLARY);  Surgeon: Katha Cabal, MD;  Location: [facility];  Service: Vascular;  Laterality: Left;   CYSTOSCOPY W/ RETROGRADES  Bilateral 02/17/2016   Procedure: CYSTOSCOPY WITH RETROGRADE PYELOGRAM;  Surgeon: Hollice Espy, MD;  Location: [facility];  Service: Urology;  Laterality: Bilateral;   CYSTOSCOPY W/ RETROGRADES Bilateral 10/12/2016   Procedure: CYSTOSCOPY WITH RETROGRADE PYELOGRAM;  Surgeon: Hollice Espy, MD;  Location: [facility];  Service: Urology;  Laterality: Bilateral;   CYSTOSCOPY W/ RETROGRADES Bilateral 06/09/2017   Procedure: CYSTOSCOPY WITH RETROGRADE PYELOGRAM;  Surgeon: Hollice Espy, MD;  Location: [facility];  Service: Urology;  Laterality: Bilateral;   CYSTOSCOPY W/ RETROGRADES Bilateral 10/07/2017   Procedure: CYSTOSCOPY WITH RETROGRADE PYELOGRAM;  Surgeon: Hollice Espy, MD;  Location: [facility];  Service: Urology;  Laterality: Bilateral;   CYSTOSCOPY W/ URETERAL STENT PLACEMENT Left 05/12/2016   Procedure: CYSTOSCOPY WITH STENT REPLACEMENT;  Surgeon: Hollice Espy, MD;  Location: [facility];  Service: Urology;  Laterality: Left;   CYSTOSCOPY W/ URETERAL STENT PLACEMENT Left 10/12/2016   Procedure: CYSTOSCOPY WITH STENT REPLACEMENT;  Surgeon: Hollice Espy, MD;  Location: [facility];  Service: Urology;  Laterality: Left;   CYSTOSCOPY WITH BIOPSY N/A 06/09/2017   Procedure: CYSTOSCOPY WITH BLADDER BIOPSY;  Surgeon: Hollice Espy, MD;  Location: [facility];  Service: Urology;  Laterality: N/A;   CYSTOSCOPY WITH STENT PLACEMENT Left 01/21/2016   Procedure: CYSTOSCOPY WITH double J STENT PLACEMENT;  Surgeon: Franchot Gallo, MD;  Location: [facility];  Service: Urology;  Laterality: Left;   CYSTOSCOPY WITH STENT PLACEMENT Right 10/12/2016   Procedure: CYSTOSCOPY WITH STENT PLACEMENT;  Surgeon: Hollice Espy, MD;  Location: [facility];  Service: Urology;  Laterality: Right;   DIALYSIS/PERMA CATHETER INSERTION N/A 11/20/2016   Procedure: Dialysis/Perma Catheter Insertion;  Surgeon: Algernon Huxley, MD;  Location: [place] [facility];  Service: Cardiovascular;  Laterality: N/A;   DIALYSIS/PERMA CATHETER  REMOVAL N/A 03/30/2017   Procedure: DIALYSIS/PERMA CATHETER REMOVAL;  Surgeon: Katha Cabal, MD;  Location: [place] [facility];  Service: Cardiovascular;  Laterality: N/A;   ESOPHAGOGASTRODUODENOSCOPY (EGD) WITH PROPOFOL N/A 09/28/2019   Procedure: ESOPHAGOGASTRODUODENOSCOPY (EGD) WITH PROPOFOL;  Surgeon: Toledo, Benay Pike, MD;  Location: [facility];  Service: Gastroenterology;  Laterality: N/A;   HIP CLOSED REDUCTION Left 10/14/2019   Procedure: CLOSED REDUCTION HIP;  Surgeon: Leim Fabry, MD;  Location: [facility];  Service: Orthopedics;  Laterality: Left;   KIDNEY SURGERY  01/21/2016   IR NEPHROSTOMY PLACEMENT LEFT    PEG PLACEMENT N/A 09/20/2019   Procedure: PERCUTANEOUS ENDOSCOPIC GASTROSTOMY (PEG) PLACEMENT;  Surgeon: Toledo, Benay Pike, MD;  Location: [facility];  Service: Gastroenterology;  Laterality: N/A;   PERIPHERAL VASCULAR CATHETERIZATION N/A 04/07/2016   Procedure: Glori Luis Cath Insertion;  Surgeon: Katha Cabal, MD;  Location: [place] [facility];  Service: Cardiovascular;  Laterality: N/A;   PERIPHERAL VASCULAR THROMBECTOMY Left 06/02/2017   Procedure: PERIPHERAL VASCULAR THROMBECTOMY;  Surgeon: Algernon Huxley, MD;  Location: [place] [facility];  Service: Cardiovascular;  Laterality: Left;   PERIPHERAL VASCULAR THROMBECTOMY Left 01/28/2018   Procedure: PERIPHERAL VASCULAR THROMBECTOMY;  Surgeon: Katha Cabal, MD;  Location: [place] [facility];  Service: Cardiovascular;  Laterality: Left;   PERIPHERAL VASCULAR THROMBECTOMY Left 03/14/2018   Procedure: PERIPHERAL VASCULAR THROMBECTOMY;  Surgeon: Algernon Huxley, MD;  Location: [place] [facility];  Service: Cardiovascular;  Laterality: Left;   PERIPHERAL VASCULAR THROMBECTOMY Left 03/16/2018   Procedure: PERIPHERAL VASCULAR THROMBECTOMY;  Surgeon: Katha Cabal, MD;  Location: [place] [facility];  Service: Cardiovascular;  Laterality: Left;   PORTA CATH REMOVAL N/A 02/07/2019   Procedure: PORTA CATH  REMOVAL;  Surgeon: Katha Cabal, MD;  Location: [place] [facility];  Service: Cardiovascular;  Laterality: N/A;   PORTACATH PLACEMENT Right    ROTATOR CUFF REPAIR Left    TEMPORARY DIALYSIS CATHETER N/A 05/11/2018   Procedure: TEMPORARY DIALYSIS CATHETER;  Surgeon: Katha Cabal, MD;  Location: [place] [facility];  Service: Cardiovascular;  Laterality: N/A;   TEMPORARY DIALYSIS CATHETER Left 07/27/2018   Procedure: TEMPORARY DIALYSIS CATHETER;  Surgeon: Katha Cabal, MD;  Location: [place] [facility];  Service: Cardiovascular;  Laterality: Left;   TOTAL HIP ARTHROPLASTY Left 09/05/2019   Procedure: TOTAL HIP ARTHROPLASTY;  Surgeon: Corky Mull, MD;  Location: [facility];  Service: Orthopedics;  Laterality: Left;   TRANSURETHRAL RESECTION OF BLADDER TUMOR N/A 02/17/2016   Procedure: TRANSURETHRAL RESECTION OF BLADDER TUMOR (TURBT)-LARGE;  Surgeon: Hollice Espy, MD;  Location: [facility];  Service: Urology;  Laterality: N/A;   TRANSURETHRAL RESECTION OF BLADDER TUMOR N/A 10/07/2017   Procedure: TRANSURETHRAL RESECTION OF BLADDER TUMOR (TURBT)-small;  Surgeon: Hollice Espy, MD;  Location: [facility];  Service: Urology;  Laterality: N/A;   URETEROSCOPY Left 02/17/2016   Procedure: URETEROSCOPY;  Surgeon:  Hollice Espy, MD;  Location: [facility];  Service: Urology;  Laterality: Left;   URETEROSCOPY Right 10/12/2016   Procedure: URETEROSCOPY;  Surgeon: Hollice Espy, MD;  Location: [facility];  Service: Urology;  Laterality: Right;    Prior to Admission medications   Medication Sig Start Date End Date Taking? Authorizing Provider  albuterol (PROVENTIL HFA;VENTOLIN HFA) 108 (90 Base) MCG/ACT inhaler Inhale 2 puffs into the lungs every 6 (six) hours as needed for wheezing or shortness of breath.  05/16/18   [provider]  Amino Acids-Protein Hydrolys (FEEDING SUPPLEMENT, PRO-STAT SUGAR FREE 64,) LIQD Place 30 mLs into feeding tube 2 (two) times daily. 09/24/19   Nolberto Hanlon, MD  amLODipine (NORVASC) 5 MG tablet Place 1 tablet (5 mg total) into feeding tube daily. 09/24/19   Nolberto Hanlon, MD  atorvastatin (LIPITOR) 40 MG tablet Place 40 mg into feeding tube at bedtime.  05/01/19   [provider]  B Complex-C (B-COMPLEX WITH VITAMIN C) tablet Place 1 tablet into feeding tube at bedtime. 09/24/19   Nolberto Hanlon, MD  Blood Glucose Monitoring Suppl (ACCU-CHEK AVIVA PLUS) w/Device KIT (USE TO MONITOR BLOOD SUGARS.) 12/30/17   [provider]  Calcium Acetate 667 MG TABS Place 1,334 mg into feeding tube 3 (three) times daily.    [provider]  calcium carbonate (TUMS - DOSED IN MG ELEMENTAL CALCIUM) 500 MG chewable tablet Place 1 tablet into feeding tube at bedtime.     [provider]  cholecalciferol (VITAMIN D) 1000 units tablet Take 1 tablet (1,000 Units total) by mouth daily. 09/24/19   Nolberto Hanlon, MD  docusate sodium (COLACE) 100 MG capsule Take 1 capsule (100 mg total) by mouth daily as needed for mild constipation. 10/17/19   Dhungel, Flonnie Overman, MD  epoetin alfa (EPOGEN) 10000 UNIT/ML injection Inject 1 mL (10,000 Units total) into the vein every Monday, Wednesday, and Friday with hemodialysis. Patient not taking: Reported on 10/14/2019 09/25/19   Nolberto Hanlon, MD  esomeprazole (NEXIUM) 40 MG packet 40 mg daily before breakfast. Per tube    [provider]  ferrous sulfate 300 (60 Fe) MG/5ML syrup Place 324 mg into feeding tube at bedtime.    [provider]  gabapentin (NEURONTIN) 300 MG capsule Place 2 capsules (600 mg total) into feeding tube 3 (three) times daily. 09/24/19   Nolberto Hanlon, MD  heparin 5000 UNIT/ML injection Inject 1 mL (5,000 Units total) into the skin every 12 (twelve) hours. 09/24/19   Nolberto Hanlon, MD  lacosamide 100 MG TABS Place 1 tablet (100 mg total) into feeding tube 2 (two) times daily. 09/24/19   Nolberto Hanlon, MD  metoCLOPramide (REGLAN) 5 MG/5ML solution Place 5 mg into feeding tube every 8  (eight) hours.    [provider]  multivitamin (RENA-VIT) TABS tablet Place 1 tablet into feeding tube at bedtime. 09/24/19   Nolberto Hanlon, MD  nitrofurantoin, macrocrystal-monohydrate, (MACROBID) 100 MG capsule Take 100 mg by mouth 2 (two) times daily.    [provider]  NON FORMULARY Place 1 Units into the nose at bedtime. CPAP Time of use 2100-0600    [provider]  OXYGEN Inhale 2 L into the lungs at bedtime.    [provider]  traMADol (ULTRAM) 50 MG tablet Take 1 tablet (50 mg total) by mouth every 12 (twelve) hours as needed for moderate pain. 10/17/19   Dhungel, Flonnie Overman, MD    Allergies Glipizide er and Percocet [oxycodone-acetaminophen]  Family History  Problem Relation Age  of Onset   Diabetes Mother    Cancer Father    Breast cancer Neg Hx    Kidney disease Neg Hx     Social History Social History   Tobacco Use   Smoking status: Former Smoker    Types: Cigarettes    Quit date: 07/20/2002    Years since quitting: 17.5   Smokeless tobacco: Never Used  Scientific laboratory technician Use: Never used  Substance Use Topics   Alcohol use: No   Drug use: No    Review of Systems  EM caveat   ____________________________________________   PHYSICAL EXAM:  VITAL SIGNS: ED Triage Vitals  Enc Vitals Group     BP 01/21/20 1057 (!) 189/86     Pulse Rate 01/21/20 1057 (!) 108     Resp 01/21/20 1057 18     Temp 01/21/20 1057 98.6 F (37 C)     Temp Source 01/21/20 1057 Axillary     SpO2 01/21/20 1057 96 %     Weight 01/21/20 1055 148 lb 5.9 oz (67.3 kg)     Height --      Head Circumference --      Peak Flow --      Pain Score --      Pain Loc --      Pain Edu? --      Excl. in [place]? --     Constitutional: Somnolent to obtunded.   Eyes: Conjunctivae are normal. Head: Atraumatic except for small laceration tongue. Nose: No congestion/rhinnorhea. Mouth/Throat: Mucous membranes are slightly dry. Neck: No stridor.   Cardiovascular: Normal rate, regular rhythm. Grossly normal heart sounds.  Good peripheral circulation. Respiratory: Normal respiratory effort.  No retractions. Lungs CTAB. Gastrointestinal: Soft and nontender. No distention. Musculoskeletal: No lower extremity tenderness nor edema.  All extremities well perfused.  Left upper extremity dialysis site clean dry intact Neurologic:  Obtunded. Poorly responsive to verbal stimuli. Resists exam in all extremities. Opens eyes to sternal rub. Non-verbal.  Skin:  Skin is warm, dry and intact. No rash noted. Psychiatric: Mood very flat  ____________________________________________   LABS (all labs ordered are listed, but only abnormal results are displayed)  Labs Reviewed  CBC - Abnormal; Notable for the following components:      Result Value   WBC 11.9 (*)    Hemoglobin 11.2 (*)    MCH 25.4 (*)    MCHC 29.6 (*)    RDW 17.5 (*)    All other components within normal limits  COMPREHENSIVE METABOLIC PANEL - Abnormal; Notable for the following components:   Potassium 2.7 (*)    Chloride 113 (*)    CO2 21 (*)    Creatinine, Ser 3.34 (*)    Calcium 6.0 (*)    Total Protein 5.4 (*)    Albumin 1.7 (*)    AST 9 (*)    GFR calc non Af Amer 12 (*)    GFR calc Af Amer 14 (*)    All other components within normal limits  URINALYSIS, COMPLETE (UACMP) WITH MICROSCOPIC - Abnormal; Notable for the following components:   Color, Urine YELLOW (*)    APPearance TURBID (*)    pH 9.0 (*)    Glucose, UA 50 (*)    Hgb urine dipstick MODERATE (*)    Protein, ur 100 (*)    Leukocytes,Ua LARGE (*)    RBC / HPF >50 (*)    WBC, UA >50 (*)    All other components  within normal limits  TROPONIN I (HIGH SENSITIVITY) - Abnormal; Notable for the following components:   Troponin I (High Sensitivity) 27 (*)    All other components within normal limits  URINE CULTURE  URINE CULTURE  LIPASE, BLOOD  PHOSPHORUS  CBC  CREATININE, SERUM  LACOSAMIDE  CBG  MONITORING, ED   ____________________________________________  EKG  Reviewed inter by me at 11 AM Heart rate 105 QRS 89 QTc 460 Sinus rhythm suspect, no evidence of acute ischemia.  Baseline artifact ____________________________________________  RADIOLOGY  CT Head Wo Contrast  Result Date: 01/21/2020 CLINICAL DATA:  Seizure and abnormal neurologic exam. Three seizures this morning. EXAM: CT HEAD WITHOUT CONTRAST TECHNIQUE: Contiguous axial images were obtained from the base of the skull through the vertex without intravenous contrast. COMPARISON:  05/12/2019 FINDINGS: Brain: Ventricles, cisterns and other CSF spaces are normal. There is moderate chronic ischemic microvascular disease. Minimal age related atrophic change. No mass, mass effect, shift of midline structures or acute hemorrhage. No evidence of acute infarction. Vascular: No hyperdense vessel or unexpected calcification. Skull: Normal. Negative for fracture or focal lesion. Sinuses/Orbits: No acute finding. Other: None. IMPRESSION: 1.  No acute findings. 2. Chronic ischemic microvascular disease and minimal age related atrophic change. Electronically Signed   By: Marin Olp M.D.   On: 01/21/2020 11:36    CT negative for acute findings ____________________________________________   PROCEDURES  Procedure(s) performed: None  Procedures  Critical Care performed: No  ____________________________________________   INITIAL IMPRESSION / ASSESSMENT AND PLAN / ED COURSE  Pertinent labs & imaging results that were available during my care of the patient were reviewed by me and considered in my medical decision making (see chart for details).   Patient presents for concerns of possibly 3 seizures today.  Patient relatively obtunded, though does respond to painful stimuli, resists exam moves all extremities.  No obvious ongoing seizure-like activity.  Seen by neurology Dr. Doy Mince who advises similar presentations in the past  recommends Vimpat 150 mg and admission for further work-up and management.  ----------------------------------------- 12:01 PM on 01/21/2020 -----------------------------------------  Currently awaiting chemistry to return.  Patient condition seems to be slowly increasing level of alertness.  Overall seems to be slowly improving, suspect likely what appears to be a prolonged postictal state  Clinical Course as of Jan 20 1322  Sun Jan 21, 2020  1128 Case and clinical history discussed with Dr. Doy Mince.  Advises IV Vimpat, knows patient personally from previous visits as well.  Will see patient in consult in ED shortly   [MQ]    Clinical Course User Index [MQ] Delman Kitten, MD    Urinalysis reviewed.  Sent for culture.  In the setting of end-stage renal disease on dialysis, review of urinalysis not conclusive this would represent a UTI.  Discussed with Dr. Ethelene Hal hospitalist who will evaluate the patient further, consider it is agreeable with urine culture  Consult placed, affirmed received by Dr. Candiss Norse nephrology ____________________________________________   FINAL CLINICAL IMPRESSION(S) / ED DIAGNOSES  Final diagnoses:  Seizure ([place])  Hypokalemia  Hypocalcemia  ESRD (end stage renal disease) ([place])        Note:  This document was prepared using Dragon voice recognition software and may include unintentional dictation errors       Delman Kitten, MD 01/21/20 1323

## 2020-01-21 NOTE — ED Notes (Signed)
MD Posey Pronto notified of calcium of 6 and potassium of 2.7

## 2020-01-21 NOTE — ED Notes (Signed)
Green top resent to lab

## 2020-01-21 NOTE — ED Notes (Signed)
Upper lip appears swollen

## 2020-01-21 NOTE — H&P (Addendum)
History and Physical    Barbara [name] TGY:[id number] DOB: 05/24/1941 DOA: 01/21/2020  PCP: Juluis Pitch, MD  Patient coming from: SNF ([facility])  I have personally briefly reviewed patient's old medical records in [place]  Chief Complaint: seizure activity  HPI: Barbara [name] is a 79 y.o. female with medical history significant of ESRD on dialysis, seizure disorder on Vimpat, CAD, HTN, HLD, COPD, asthma, dCHF , T2DM, GERD, bladder cancer who presented to the ED via EMS from her facility today due to seizure activity.  Patient unable to provide any history and no description of what was witnessed at the facility was provided.  She reportedly had 3 episodes of seizure activity today, was treated at facility with Ativan.  Patient has been taking Vimpat and last dose was yesterday evening.  When seen in the ED, patient is unresponsive to verbal sternal rub but in no apparent distress.    Patient has been followed by neurology for past year for prolonged episodes of altered mental status.  Evaluations have been unremarkable.  ED Course: Afebrile, BP 189/86, HR 108 with normal respiratory rate and no hypoxia.  Labs were notable for mild leukocytosis 11.9, mild and stable anemia with hemoglobin 11.2, potassium 2.7, calcium 6.0 (with albumin 1.7, corrects to 7.8), troponin mildly elevated 27.  Urinalysis appeared consistent with infection, RBC's present.  CT head was negative for acute findings.  EKG showed sinus arrhythmia versus atrial tachycardia at 105 bpm, normal axis, PACs, no acute ischemic changes.  ED physician spoke with neurologist Dr. Doy Mince who knows patient well and recommended IV Vimpat at increased dose and EEG.  Admitted to hospitalist service with consults to neurology, nephrology and palliative care.  Review of Systems: As per HPI otherwise 10 point review of systems negative.    Past Medical History:  Diagnosis Date  . Adult celiac disease   . Anemia associated  with chronic renal failure 2017   blood transfusion last week 10/17  . Arthritis   . Asthma   . Cancer ([place]) 2017   bladder  . Chronic kidney disease   . 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  dr. Candiss Norse and dr. Holley Raring follow her  . COPD (chronic obstructive pulmonary disease) ([place])   . Coronary artery disease   . Diabetes mellitus without complication ([place])   . Dialysis patient ([facility])    Tues, Thurs, Sat  . Dyspnea    with exertion  . Elevated lipids   . GERD (gastroesophageal reflux disease)   . Hematuria   . Hypertension   . Lower back pain   . Neuropathy   . Oxygen dependent    at hs  . Personal history of chemotherapy 2017   bladder ca  . Personal history of radiation therapy 2017   bladder ca  . Sleep apnea    uses cpap  . Urinary obstruction 01/2016    Past Surgical History:  Procedure Laterality Date  . A/V SHUNT INTERVENTION N/A 05/12/2018   Procedure: A/V SHUNT INTERVENTION;  Surgeon: Algernon Huxley, MD;  Location: [facility];  Service: Cardiovascular;  Laterality: N/A;  . A/V SHUNT INTERVENTION Left 07/29/2018   Procedure: A/V SHUNT INTERVENTION;  Surgeon: Katha Cabal, MD;  Location: [facility];  Service: Cardiovascular;  Laterality: Left;  . ABDOMINAL HYSTERECTOMY    . AV FISTULA PLACEMENT Left 02/17/2017   Procedure: INSERTION OF ARTERIOVENOUS (AV) GORE-TEX GRAFT ARM(BRACHIAL AXILLARY);  Surgeon: Delana Meyer,  Dolores Lory, MD;  Location: [facility];  Service: Vascular;  Laterality: Left;  . CYSTOSCOPY W/ RETROGRADES Bilateral 02/17/2016   Procedure: CYSTOSCOPY WITH RETROGRADE PYELOGRAM;  Surgeon: Hollice Espy, MD;  Location: [facility];  Service: Urology;  Laterality: Bilateral;  . CYSTOSCOPY W/ RETROGRADES Bilateral 10/12/2016   Procedure: CYSTOSCOPY WITH RETROGRADE PYELOGRAM;  Surgeon: Hollice Espy, MD;  Location: [facility];  Service: Urology;  Laterality: Bilateral;  . CYSTOSCOPY W/ RETROGRADES Bilateral 06/09/2017    Procedure: CYSTOSCOPY WITH RETROGRADE PYELOGRAM;  Surgeon: Hollice Espy, MD;  Location: [facility];  Service: Urology;  Laterality: Bilateral;  . CYSTOSCOPY W/ RETROGRADES Bilateral 10/07/2017   Procedure: CYSTOSCOPY WITH RETROGRADE PYELOGRAM;  Surgeon: Hollice Espy, MD;  Location: [facility];  Service: Urology;  Laterality: Bilateral;  . CYSTOSCOPY W/ URETERAL STENT PLACEMENT Left 05/12/2016   Procedure: CYSTOSCOPY WITH STENT REPLACEMENT;  Surgeon: Hollice Espy, MD;  Location: [facility];  Service: Urology;  Laterality: Left;  . CYSTOSCOPY W/ URETERAL STENT PLACEMENT Left 10/12/2016   Procedure: CYSTOSCOPY WITH STENT REPLACEMENT;  Surgeon: Hollice Espy, MD;  Location: [facility];  Service: Urology;  Laterality: Left;  . CYSTOSCOPY WITH BIOPSY N/A 06/09/2017   Procedure: CYSTOSCOPY WITH BLADDER BIOPSY;  Surgeon: Hollice Espy, MD;  Location: [facility];  Service: Urology;  Laterality: N/A;  . CYSTOSCOPY WITH STENT PLACEMENT Left 01/21/2016   Procedure: CYSTOSCOPY WITH double J STENT PLACEMENT;  Surgeon: Franchot Gallo, MD;  Location: [facility];  Service: Urology;  Laterality: Left;  . CYSTOSCOPY WITH STENT PLACEMENT Right 10/12/2016   Procedure: CYSTOSCOPY WITH STENT PLACEMENT;  Surgeon: Hollice Espy, MD;  Location: [facility];  Service: Urology;  Laterality: Right;  . DIALYSIS/PERMA CATHETER INSERTION N/A 11/20/2016   Procedure: Dialysis/Perma Catheter Insertion;  Surgeon: Algernon Huxley, MD;  Location: [facility];  Service: Cardiovascular;  Laterality: N/A;  . DIALYSIS/PERMA CATHETER REMOVAL N/A 03/30/2017   Procedure: DIALYSIS/PERMA CATHETER REMOVAL;  Surgeon: Katha Cabal, MD;  Location: [place] [facility];  Service: Cardiovascular;  Laterality: N/A;  . ESOPHAGOGASTRODUODENOSCOPY (EGD) WITH PROPOFOL N/A 09/28/2019   Procedure: ESOPHAGOGASTRODUODENOSCOPY (EGD) WITH PROPOFOL;  Surgeon: Toledo, Benay Pike, MD;  Location: [facility];  Service: Gastroenterology;  Laterality: N/A;  .  HIP CLOSED REDUCTION Left 10/14/2019   Procedure: CLOSED REDUCTION HIP;  Surgeon: Leim Fabry, MD;  Location: [facility];  Service: Orthopedics;  Laterality: Left;  . KIDNEY SURGERY  01/21/2016   IR NEPHROSTOMY PLACEMENT LEFT   . PEG PLACEMENT N/A 09/20/2019   Procedure: PERCUTANEOUS ENDOSCOPIC GASTROSTOMY (PEG) PLACEMENT;  Surgeon: Toledo, Benay Pike, MD;  Location: [facility];  Service: Gastroenterology;  Laterality: N/A;  . PERIPHERAL VASCULAR CATHETERIZATION N/A 04/07/2016   Procedure: Glori Luis Cath Insertion;  Surgeon: Katha Cabal, MD;  Location: [place] [facility];  Service: Cardiovascular;  Laterality: N/A;  . PERIPHERAL VASCULAR THROMBECTOMY Left 06/02/2017   Procedure: PERIPHERAL VASCULAR THROMBECTOMY;  Surgeon: Algernon Huxley, MD;  Location: [place] [facility];  Service: Cardiovascular;  Laterality: Left;  . PERIPHERAL VASCULAR THROMBECTOMY Left 01/28/2018   Procedure: PERIPHERAL VASCULAR THROMBECTOMY;  Surgeon: Katha Cabal, MD;  Location: [place] [facility];  Service: Cardiovascular;  Laterality: Left;  . PERIPHERAL VASCULAR THROMBECTOMY Left 03/14/2018   Procedure: PERIPHERAL VASCULAR THROMBECTOMY;  Surgeon: Algernon Huxley, MD;  Location: [place] [facility];  Service: Cardiovascular;  Laterality: Left;  . PERIPHERAL VASCULAR THROMBECTOMY Left 03/16/2018   Procedure: PERIPHERAL VASCULAR THROMBECTOMY;  Surgeon: Katha Cabal, MD;  Location: [place] [facility];  Service: Cardiovascular;  Laterality: Left;  . PORTA CATH REMOVAL N/A 02/07/2019   Procedure: PORTA CATH REMOVAL;  Surgeon: Katha Cabal, MD;  Location: [place] [facility];  Service: Cardiovascular;  Laterality: N/A;  . PORTACATH PLACEMENT Right   . ROTATOR CUFF REPAIR Left   . TEMPORARY DIALYSIS CATHETER N/A 05/11/2018   Procedure: TEMPORARY DIALYSIS CATHETER;  Surgeon: Katha Cabal, MD;  Location: [place] [facility];  Service: Cardiovascular;  Laterality: N/A;  . TEMPORARY DIALYSIS CATHETER Left  07/27/2018   Procedure: TEMPORARY DIALYSIS CATHETER;  Surgeon: Katha Cabal, MD;  Location: [place] [facility];  Service: Cardiovascular;  Laterality: Left;  . TOTAL HIP ARTHROPLASTY Left 09/05/2019   Procedure: TOTAL HIP ARTHROPLASTY;  Surgeon: Corky Mull, MD;  Location: [facility];  Service: Orthopedics;  Laterality: Left;  . TRANSURETHRAL RESECTION OF BLADDER TUMOR N/A 02/17/2016   Procedure: TRANSURETHRAL RESECTION OF BLADDER TUMOR (TURBT)-LARGE;  Surgeon: Hollice Espy, MD;  Location: [facility];  Service: Urology;  Laterality: N/A;  . TRANSURETHRAL RESECTION OF BLADDER TUMOR N/A 10/07/2017   Procedure: TRANSURETHRAL RESECTION OF BLADDER TUMOR (TURBT)-small;  Surgeon: Hollice Espy, MD;  Location: [facility];  Service: Urology;  Laterality: N/A;  . URETEROSCOPY Left 02/17/2016   Procedure: URETEROSCOPY;  Surgeon: Hollice Espy, MD;  Location: [facility];  Service: Urology;  Laterality: Left;  . URETEROSCOPY Right 10/12/2016   Procedure: URETEROSCOPY;  Surgeon: Hollice Espy, MD;  Location: [facility];  Service: Urology;  Laterality: Right;     reports that she quit smoking about 17 years ago. Her smoking use included cigarettes. She has never used smokeless tobacco. She reports that she does not drink alcohol and does not use drugs.  Allergies  Allergen Reactions  . Glipizide Er Other (See Comments)    Hypoglycemia   . Percocet [Oxycodone-Acetaminophen] Hives    Family History  Problem Relation Age of Onset  . Diabetes Mother   . Cancer Father   . Breast cancer Neg Hx   . Kidney disease Neg Hx       Prior to Admission medications   Medication Sig Start Date End Date Taking? Authorizing Provider  albuterol (PROVENTIL HFA;VENTOLIN HFA) 108 (90 Base) MCG/ACT inhaler Inhale 2 puffs into the lungs every 6 (six) hours as needed for wheezing or shortness of breath.  05/16/18   [provider]  Amino Acids-Protein Hydrolys (FEEDING SUPPLEMENT, PRO-STAT SUGAR FREE 64,) LIQD  Place 30 mLs into feeding tube 2 (two) times daily. 09/24/19   Nolberto Hanlon, MD  amLODipine (NORVASC) 5 MG tablet Place 1 tablet (5 mg total) into feeding tube daily. 09/24/19   Nolberto Hanlon, MD  atorvastatin (LIPITOR) 40 MG tablet Place 40 mg into feeding tube at bedtime.  05/01/19   [provider]  B Complex-C (B-COMPLEX WITH VITAMIN C) tablet Place 1 tablet into feeding tube at bedtime. 09/24/19   Nolberto Hanlon, MD  Blood Glucose Monitoring Suppl (ACCU-CHEK AVIVA PLUS) w/Device KIT (USE TO MONITOR BLOOD SUGARS.) 12/30/17   [provider]  Calcium Acetate 667 MG TABS Place 1,334 mg into feeding tube 3 (three) times daily.    [provider]  calcium carbonate (TUMS - DOSED IN MG ELEMENTAL CALCIUM) 500 MG chewable tablet Place 1 tablet into feeding tube at bedtime.     [provider]  cholecalciferol (VITAMIN D) 1000 units tablet Take 1 tablet (1,000 Units total) by mouth daily. 09/24/19   Nolberto Hanlon, MD  docusate sodium (COLACE) 100 MG capsule Take 1 capsule (100 mg  total) by mouth daily as needed for mild constipation. 10/17/19   Dhungel, Flonnie Overman, MD  epoetin alfa (EPOGEN) 10000 UNIT/ML injection Inject 1 mL (10,000 Units total) into the vein every Monday, Wednesday, and Friday with hemodialysis. Patient not taking: Reported on 10/14/2019 09/25/19   Nolberto Hanlon, MD  esomeprazole (NEXIUM) 40 MG packet 40 mg daily before breakfast. Per tube    [provider]  ferrous sulfate 300 (60 Fe) MG/5ML syrup Place 324 mg into feeding tube at bedtime.    [provider]  gabapentin (NEURONTIN) 300 MG capsule Place 2 capsules (600 mg total) into feeding tube 3 (three) times daily. 09/24/19   Nolberto Hanlon, MD  heparin 5000 UNIT/ML injection Inject 1 mL (5,000 Units total) into the skin every 12 (twelve) hours. 09/24/19   Nolberto Hanlon, MD  lacosamide 100 MG TABS Place 1 tablet (100 mg total) into feeding tube 2 (two) times daily. 09/24/19   Nolberto Hanlon, MD   metoCLOPramide (REGLAN) 5 MG/5ML solution Place 5 mg into feeding tube every 8 (eight) hours.    [provider]  multivitamin (RENA-VIT) TABS tablet Place 1 tablet into feeding tube at bedtime. 09/24/19   Nolberto Hanlon, MD  nitrofurantoin, macrocrystal-monohydrate, (MACROBID) 100 MG capsule Take 100 mg by mouth 2 (two) times daily.    [provider]  NON FORMULARY Place 1 Units into the nose at bedtime. CPAP Time of use 2100-0600    [provider]  OXYGEN Inhale 2 L into the lungs at bedtime.    [provider]  traMADol (ULTRAM) 50 MG tablet Take 1 tablet (50 mg total) by mouth every 12 (twelve) hours as needed for moderate pain. 10/17/19   Louellen Molder, MD    Physical Exam: Vitals:   01/21/20 1302 01/21/20 1307 01/21/20 1332 01/21/20 1337  BP: (!) 156/85  (!) 174/76   Pulse:      Resp:      Temp:      TempSrc:      SpO2:  100%  100%  Weight:       Caveat: exam limited by patient unresponsive.  Constitutional: NAD, sleeping, unresponsive ENMT: dry mucous membranes, poor dentition.  Neck: normal, supple, no masses, no thyromegaly Respiratory: CTAB anteriorly and laterally, no wheezing, no crackles. Normal respiratory effort. No accessory muscle use.  Cardiovascular: irregular, 2/6 systolic murmur, no extremity edema. 2+ pedal pulses.  Abdomen: soft, NT, ND, no masses or HSM palpated. +Bowel sounds.  Musculoskeletal: no clubbing / cyanosis. Normal muscle tone.  RUE fistula with palpable thrill. Skin: dry, intact, normal temperature Neurologic: Unable to evaluate due to patient condition, unresponsive Psychiatric: Unable to evaluate due to patient condition    Labs on Admission: I have personally reviewed following labs and imaging studies  CBC: Recent Labs  Lab 01/21/20 1109  WBC 11.9*  HGB 11.2*  HCT 37.9  MCV 85.9  PLT 496   Basic Metabolic Panel: Recent Labs  Lab 01/21/20 1204  NA 141  K 2.7*  CL 113*  CO2 21*  GLUCOSE  83  BUN 19  CREATININE 3.34*  CALCIUM 6.0*   GFR: Estimated Creatinine Clearance: 12.7 mL/min (A) (by C-G formula based on SCr of 3.34 mg/dL (H)). Liver Function Tests: Recent Labs  Lab 01/21/20 1204  AST 9*  ALT <5  ALKPHOS 54  BILITOT 0.4  PROT 5.4*  ALBUMIN 1.7*   Recent Labs  Lab 01/21/20 1204  LIPASE 15   No results for input(s): AMMONIA in the last  168 hours. Coagulation Profile: No results for input(s): INR, PROTIME in the last 168 hours. Cardiac Enzymes: No results for input(s): CKTOTAL, CKMB, CKMBINDEX, TROPONINI in the last 168 hours. BNP (last 3 results) No results for input(s): PROBNP in the last 8760 hours. HbA1C: No results for input(s): HGBA1C in the last 72 hours. CBG: No results for input(s): GLUCAP in the last 168 hours. Lipid Profile: No results for input(s): CHOL, HDL, LDLCALC, TRIG, CHOLHDL, LDLDIRECT in the last 72 hours. Thyroid Function Tests: No results for input(s): TSH, T4TOTAL, FREET4, T3FREE, THYROIDAB in the last 72 hours. Anemia Panel: No results for input(s): VITAMINB12, FOLATE, FERRITIN, TIBC, IRON, RETICCTPCT in the last 72 hours. Urine analysis:    Component Value Date/Time   COLORURINE YELLOW (A) 01/21/2020 1204   APPEARANCEUR TURBID (A) 01/21/2020 1204   APPEARANCEUR Cloudy (A) 02/28/2019 0902   LABSPEC 1.005 01/21/2020 1204   LABSPEC 1.012 08/02/2012 1114   PHURINE 9.0 (H) 01/21/2020 1204   GLUCOSEU 50 (A) 01/21/2020 1204   GLUCOSEU Negative 08/02/2012 1114   HGBUR MODERATE (A) 01/21/2020 1204   BILIRUBINUR NEGATIVE 01/21/2020 1204   BILIRUBINUR Negative 02/28/2019 0902   BILIRUBINUR Negative 08/02/2012 1114   KETONESUR NEGATIVE 01/21/2020 1204   PROTEINUR 100 (A) 01/21/2020 1204   NITRITE NEGATIVE 01/21/2020 1204   LEUKOCYTESUR LARGE (A) 01/21/2020 1204   LEUKOCYTESUR Negative 08/02/2012 1114    Radiological Exams on Admission: CT Head Wo Contrast  Result Date: 01/21/2020 CLINICAL DATA:  Seizure and abnormal  neurologic exam. Three seizures this morning. EXAM: CT HEAD WITHOUT CONTRAST TECHNIQUE: Contiguous axial images were obtained from the base of the skull through the vertex without intravenous contrast. COMPARISON:  05/12/2019 FINDINGS: Brain: Ventricles, cisterns and other CSF spaces are normal. There is moderate chronic ischemic microvascular disease. Minimal age related atrophic change. No mass, mass effect, shift of midline structures or acute hemorrhage. No evidence of acute infarction. Vascular: No hyperdense vessel or unexpected calcification. Skull: Normal. Negative for fracture or focal lesion. Sinuses/Orbits: No acute finding. Other: None. IMPRESSION: 1.  No acute findings. 2. Chronic ischemic microvascular disease and minimal age related atrophic change. Electronically Signed   By: Marin Olp M.D.   On: 01/21/2020 11:36    EKG: Independently reviewed. sinus arrhythmia versus atrial tachycardia at 105 bpm, normal axis, PACs, no acute ischemic changes.  Assessment/Plan Principal Problem:   Seizure ([place]) Active Problems:   Acute metabolic encephalopathy   Hypokalemia   Leukocytosis   Protein-calorie malnutrition, severe   ESRD (end stage renal disease) (HCC)   Metabolic acidosis   Type II diabetes mellitus with renal manifestations (HCC)   Pressure injury of skin   Hypocalcemia   Abnormal urinalysis   Elevated troponin   Failure to thrive in adult   Anemia in chronic kidney disease   COPD (chronic obstructive pulmonary disease) (HCC)   CAD in native artery   Benign essential HTN   Chronic diastolic CHF (congestive heart failure) (HCC)   Sacral ulcer (HCC)    Seizure - history of seizure disorder on Vimpat 100 mg BID.  Patient apparently had 3 seizure episodes today at [facility].  Neurology is following.  Vimpat 150 mg IV on admission, then 150 mg IV BID starting this evening.  Seizure precautions.  EEG.  IV Ativan as needed for seizure activity.  Neuro checks.  Continuous pulse ox.   Vital signs every 4 hours.  Acute metabolic encephalopathy - present on admission, postictal state and s/p Ativan.  Has frequent and prolonged  episodes of AMS recently and followed by neurology.  Maintenance IV fluids.  Further management as above.  If not resolving will do further evaluation for other etiologies.  N.p.o. for now.  Hypokalemia -present on admission, K2.7.  Replaced in the ED  Hypocalcemia -present on admission, corrected calcium 7.8.  Will defer to nephrology on any replacement.  Abnormal urinalysis -UA was turbid with large leukocytes, >50 WBCs, >50 RBCs.  Unknown if patient is symptomatic at this time.  Urine culture is pending.  Elevated troponin -present on admission, troponin 27.  Suspect demand ischemia in the setting of seizure and tachycardia.  Check repeat troponin at 4pm.  Leukocytosis -present on admission, mild, 11.9.  Could be due to seizure, possible UTI given her abnormal UA.  Follow-up urine culture.  Defer antibiotics for now and monitor clinically.  Daily CBC to monitor.    Severe protein-calorie malnutrition - Albumin 1.7.  Complicates overall care and prognosis.  Dietitian consult.  Palliative care consult.  Failure to thrive in adult -by chart review, it appears patient has steadily been declining over the past few months.  Palliative care consulted.  ESRD - on dialysis MWF.  Nephrology is consulted.  Continue calcium acetate and vitamin D.  Metabolic acidosis -present on admission, due to renal failure.  Monitor BMP.  Type II diabetes mellitus -very sensitive sliding scale NovoLog.  Pressure injury of skin - sacral stage 1 present on admission.  Anemia in chronic kidney disease -stable, hemoglobin 11.2.  Monitor CBC.  CAD in native artery -appears stable.  Resume home meds when mentation improves.  Benign essential HTN -uncontrolled on admission.  Resume home meds when mentation improves.  IV hydralazine as needed  Chronic diastolic CHF  -appears euvolemic on admission.  Continue home meds when mentation improves.  COPD -not acutely exacerbated.  GERD - continue PPI when mentation improves.  Sacral decubitus ulcer, stage 1 - present on admission.  Reposition patient every 2 hours.  WOC consult.   DVT prophylaxis: heparin injection 5,000 Units Start: 01/21/20 1400   Code Status: DNR  Family Communication: none at bedside, will attempt to call  Disposition Plan: likely return to SNF pending [place] discussion Consults called: neurology, nephrology, palliative care   Admission status:  Status is: Inpatient  Remains inpatient appropriate because:Inpatient level of care appropriate due to severity of illness   Dispo: The patient is from: SNF              Anticipated d/c is to: SNF              Anticipated d/c date is: 3 days              Patient currently is not medically stable to d/c.    Ezekiel Slocumb, DO [facility]  01/21/2020, 2:17 PM    If 7PM-7AM, please contact night-coverage. How to contact the [facility] Attending or Consulting provider [place] or covering provider during after hours [place], for this patient?    1. Check the care team in [facility] and look for a) attending/consulting [facility] provider listed and b) the [facility] team listed 2. Log into [email or web address] and use [place]'s universal password to access. If you do not have the password, please contact the hospital operator. 3. Locate the [facility] provider you are looking for under [facility] and page to a number that you can be directly reached. 4. If you still have difficulty reaching the provider, please page the [facility] (Director on Call)  for the Hospitalists listed on amion for assistance.

## 2020-01-21 NOTE — ED Notes (Signed)
Parke Simmers- daughter updated at this time, notified of admission to hospital.

## 2020-01-21 NOTE — Plan of Care (Signed)
  Problem: Education: Goal: Knowledge of General Education information will improve Description Including pain rating scale, medication(s)/side effects and non-pharmacologic comfort measures Outcome: Progressing   

## 2020-01-22 ENCOUNTER — Inpatient Hospital Stay: Payer: Medicare Other

## 2020-01-22 ENCOUNTER — Encounter: Payer: Self-pay | Admitting: Internal Medicine

## 2020-01-22 DIAGNOSIS — Z515 Encounter for palliative care: Secondary | ICD-10-CM

## 2020-01-22 DIAGNOSIS — Z7189 Other specified counseling: Secondary | ICD-10-CM

## 2020-01-22 DIAGNOSIS — R627 Adult failure to thrive: Secondary | ICD-10-CM

## 2020-01-22 DIAGNOSIS — I5032 Chronic diastolic (congestive) heart failure: Secondary | ICD-10-CM

## 2020-01-22 DIAGNOSIS — N186 End stage renal disease: Secondary | ICD-10-CM

## 2020-01-22 DIAGNOSIS — G40201 Localization-related (focal) (partial) symptomatic epilepsy and epileptic syndromes with complex partial seizures, not intractable, with status epilepticus: Secondary | ICD-10-CM | POA: Diagnosis present

## 2020-01-22 DIAGNOSIS — E43 Unspecified severe protein-calorie malnutrition: Secondary | ICD-10-CM

## 2020-01-22 DIAGNOSIS — R569 Unspecified convulsions: Secondary | ICD-10-CM

## 2020-01-22 DIAGNOSIS — G9341 Metabolic encephalopathy: Secondary | ICD-10-CM

## 2020-01-22 LAB — BASIC METABOLIC PANEL
Anion gap: 11 (ref 5–15)
BUN: 33 mg/dL — ABNORMAL HIGH (ref 8–23)
CO2: 24 mmol/L (ref 22–32)
Calcium: 8.5 mg/dL — ABNORMAL LOW (ref 8.9–10.3)
Chloride: 104 mmol/L (ref 98–111)
Creatinine, Ser: 6.2 mg/dL — ABNORMAL HIGH (ref 0.44–1.00)
GFR calc Af Amer: 7 mL/min — ABNORMAL LOW (ref >60)
GFR calc non Af Amer: 6 mL/min — ABNORMAL LOW (ref >60)
Glucose, Bld: 82 mg/dL (ref 70–99)
Potassium: 4 mmol/L (ref 3.5–5.1)
Sodium: 139 mmol/L (ref 135–145)

## 2020-01-22 LAB — CBC
HCT: 31.1 % — ABNORMAL LOW (ref 36.0–46.0)
Hemoglobin: 9.1 g/dL — ABNORMAL LOW (ref 12.0–15.0)
MCH: 24.9 pg — ABNORMAL LOW (ref 26.0–34.0)
MCHC: 29.3 g/dL — ABNORMAL LOW (ref 30.0–36.0)
MCV: 85.2 fL (ref 80.0–100.0)
Platelets: 197 10*3/uL (ref 150–400)
RBC: 3.65 MIL/uL — ABNORMAL LOW (ref 3.87–5.11)
RDW: 17.3 % — ABNORMAL HIGH (ref 11.5–15.5)
WBC: 6.5 10*3/uL (ref 4.0–10.5)
nRBC: 0 % (ref 0.0–0.2)

## 2020-01-22 LAB — MRSA PCR SCREENING: MRSA by PCR: POSITIVE — AB

## 2020-01-22 LAB — GLUCOSE, CAPILLARY
Glucose-Capillary: 77 mg/dL (ref 70–99)
Glucose-Capillary: 53 mg/dL — ABNORMAL LOW (ref 70–99)
Glucose-Capillary: 60 mg/dL — ABNORMAL LOW (ref 70–99)
Glucose-Capillary: 140 mg/dL — ABNORMAL HIGH (ref 70–99)
Glucose-Capillary: 81 mg/dL (ref 70–99)

## 2020-01-22 LAB — MAGNESIUM: Magnesium: 1.6 mg/dL — ABNORMAL LOW (ref 1.7–2.4)

## 2020-01-22 LAB — URINE CULTURE: Culture: NO GROWTH

## 2020-01-22 LAB — PHOSPHORUS: Phosphorus: 2.5 mg/dL (ref 2.5–4.6)

## 2020-01-22 MED ORDER — NEPRO/CARBSTEADY PO LIQD
1000.0000 mL | ORAL | Status: DC
  Administered 2020-01-22: 1000 mL

## 2020-01-22 MED ORDER — DEXTROSE 50 % IV SOLN
INTRAVENOUS | Status: AC
  Filled 2020-01-22: qty 50

## 2020-01-22 MED ORDER — LEVETIRACETAM IN NACL 1000 MG/100ML IV SOLN
1000.0000 mg | Freq: Once | INTRAVENOUS | Status: AC
  Administered 2020-01-22: 1000 mg via INTRAVENOUS
  Filled 2020-01-22: qty 100

## 2020-01-22 MED ORDER — EPOETIN ALFA 10000 UNIT/ML IJ SOLN: 4000.0000 [IU] | INTRAMUSCULAR | Status: DC

## 2020-01-22 MED ORDER — DEXTROSE 50 % IV SOLN
25.0000 g | INTRAVENOUS | Status: AC
  Administered 2020-01-22: 25 g via INTRAVENOUS

## 2020-01-22 MED ORDER — PRO-STAT SUGAR FREE PO LIQD: 30.0000 mL | Freq: Every day | ORAL | Status: DC

## 2020-01-22 MED ORDER — FREE WATER
50.0000 mL | Status: DC
  Administered 2020-01-22 – 2020-01-23 (×3): 50 mL

## 2020-01-22 MED ORDER — SODIUM CHLORIDE 0.9 % IV SOLN
150.0000 mg | Freq: Two times a day (BID) | INTRAVENOUS | Status: DC
  Administered 2020-01-22 – 2020-01-25 (×7): 150 mg via INTRAVENOUS
  Filled 2020-01-22 (×9): qty 15

## 2020-01-22 MED ORDER — SODIUM CHLORIDE 0.9 % IV SOLN
1350.0000 mg | Freq: Once | INTRAVENOUS | Status: AC
  Administered 2020-01-22: 1350 mg via INTRAVENOUS
  Filled 2020-01-22: qty 27

## 2020-01-22 MED ORDER — CHLORHEXIDINE GLUCONATE CLOTH 2 % EX PADS
6.0000 | MEDICATED_PAD | Freq: Every day | CUTANEOUS | Status: DC
  Administered 2020-01-22: 6 via TOPICAL

## 2020-01-22 MED ORDER — HYDRALAZINE HCL 20 MG/ML IJ SOLN: 10.0000 mg | Freq: Once | INTRAMUSCULAR | Status: AC

## 2020-01-22 MED ORDER — POTASSIUM & SODIUM PHOSPHATES 280-160-250 MG PO PACK
1.0000 | PACK | Freq: Three times a day (TID) | ORAL | Status: DC
  Filled 2020-01-22 (×3): qty 1

## 2020-01-22 MED ORDER — SODIUM CHLORIDE 0.9 % IV SOLN
20.0000 mg/kg | Freq: Once | INTRAVENOUS | Status: DC
  Filled 2020-01-22: qty 26.92

## 2020-01-22 MED ORDER — LEVETIRACETAM IN NACL 500 MG/100ML IV SOLN
500.0000 mg | Freq: Three times a day (TID) | INTRAVENOUS | Status: DC
  Filled 2020-01-22 (×3): qty 100

## 2020-01-22 MED ORDER — MUPIROCIN 2 % EX OINT
1.0000 "application " | TOPICAL_OINTMENT | Freq: Two times a day (BID) | CUTANEOUS | Status: DC
  Administered 2020-01-22 – 2020-01-23 (×4): 1 via NASAL
  Filled 2020-01-22 (×2): qty 22

## 2020-01-22 MED ORDER — LEVETIRACETAM IN NACL 500 MG/100ML IV SOLN
500.0000 mg | Freq: Two times a day (BID) | INTRAVENOUS | Status: DC
  Administered 2020-01-23 – 2020-01-25 (×5): 500 mg via INTRAVENOUS
  Filled 2020-01-22 (×9): qty 100

## 2020-01-22 MED ORDER — RENA-VITE PO TABS
1.0000 | ORAL_TABLET | Freq: Every day | ORAL | Status: DC
  Filled 2020-01-22: qty 1

## 2020-01-22 MED ORDER — POTASSIUM CHLORIDE 20 MEQ PO PACK: 20.0000 meq | PACK | Freq: Once | ORAL | Status: DC

## 2020-01-22 NOTE — Consult Note (Signed)
[facility] Nurse Consult Note: Patient receiving care in [facility].  Consult completed remotely after review of record. Reason for Consult: stage 1 sacrum Wound type: per WOC consult, stage 1 sacrum Pressure Injury POA: Yes/No/NA Measurement: To be provided by the bedside RN in the flowsheet section Wound bed: Drainage (amount, consistency, odor)  Periwound: Dressing procedure/placement/frequency: Using the Skin Care Order Set available to all licensed nurses within the [facility] system, I enacted foam dressings to the buttock areas listed on the flowsheet at stage 2 PIs.  I placed a separate order for the sacral dressing, as the sacral stage 1 is not listed on the flowsheet.  And, an order instructing staff to relieve pressure to the sacrum with the use of a foam dressing and turning the patient. Monitor the wound area(s) for worsening of condition such as: Signs/symptoms of infection,  Increase in size,  Development of or worsening of odor, Development of pain, or increased pain at the affected locations.  Notify the medical team if any of these develop.  Thank you for the consult. [facility] nurse will not follow at this time.  Please re-consult the [place] team if needed.  Val Riles, RN, MSN, CWOCN, CNS-BC, pager [phone]

## 2020-01-22 NOTE — Progress Notes (Signed)
Chaplain responded to Rapid Response Code, offered prayer for patient as she received medical attention. No family present.

## 2020-01-22 NOTE — Progress Notes (Signed)
SLP Cancellation Note  Patient Details Name: Barbara [name] MRN: [id number] DOB: Sep 06, 1940   Cancelled treatment:       Reason Eval/Treat Not Completed:  (chart reviewed; consulted NSG). Discussed pt's baseline status w/ NSG. Pt received a PEG placement d/t FTT last admit in 09/2019. She had achieved an oral, Dysphagia diet and was taking such at the NH along w/ TFs per NSG report. Per Neurology/chart notes since last discharge, pt has continued to go downhill, has been declining in alertness and function but did seem to be eating with assistance; continued with episodes of hypoactive delirium. Also noted Palliative Care consult again this admit.  Recommend current use of PEG for TFs, flushes for nutrition/hydration and Meds as pt continues to improve from seizure activity necessitating admit to the hospital. Noted pt is restless currently per NSG notes. As pt continues to improve, ST services will f/u next 1-2 days w/ assessment of toleration of her baseline Dysphagia diet as recommended since last admit as Pleasure feedings. Recommend frequent oral care for hygiene. Recommend Dietician consult for TF support. This was discussed w/ NSG who agreed.     ,Barbara Kenner, MS, CCC-SLP [name] 01/22/2020, 2:35 PM

## 2020-01-22 NOTE — Progress Notes (Signed)
Subjective: Patient improved.  No seizure like activity since admission.    Objective: Current vital signs: BP 132/89   Pulse 96   Temp 97.8 F (36.6 C) (Oral)   Resp (!) 21   Ht 5' 4"  (1.626 m)   Wt 67.3 kg   SpO2 96%   BMI 25.47 kg/m  Vital signs in last 24 hours: Temp:  [97.8 F (36.6 C)-99.2 F (37.3 C)] 97.8 F (36.6 C) (07/05 0837) Pulse Rate:  [60-110] 96 (07/05 1200) Resp:  [14-27] 21 (07/05 1200) BP: (132-185)/(60-95) 132/89 (07/05 1200) SpO2:  [96 %-100 %] 96 % (07/05 0837)  Intake/Output from previous day: 07/04 0701 - 07/05 0700 In: 912.7 [I.V.:887.7; IV Piggyback:25] Out: -  Intake/Output this shift: Total I/O In: 183.7 [I.V.:183.7] Out: -  Nutritional status:  Diet Order            Diet NPO time specified  Diet effective now                 Neurologic Exam: Mental Status: In bed with eyes open.  Responds verbally to questions asked.  Follows some simple commands although delayed.   Cranial Nerves: II: Pupils equal, round, reactive to light and accommodation.  Blinks to bilateral confrontation III,IV,VI: Extraocular movements intact bilaterally.  V,VII: corneal reflex present bilaterally  VIII: grossly intact Motor: Moves upper extremities spontaneously  Sensory: Sensation intact   Lab Results: Basic Metabolic Panel: Recent Labs  Lab 01/21/20 1204 01/21/20 1809  NA 141  --   K 2.7*  --   CL 113*  --   CO2 21*  --   GLUCOSE 83  --   BUN 19  --   CREATININE 3.34*  --   CALCIUM 6.0*  --   PHOS  --  1.2*    Liver Function Tests: Recent Labs  Lab 01/21/20 1204  AST 9*  ALT <5  ALKPHOS 54  BILITOT 0.4  PROT 5.4*  ALBUMIN 1.7*   Recent Labs  Lab 01/21/20 1204  LIPASE 15   No results for input(s): AMMONIA in the last 168 hours.  CBC: Recent Labs  Lab 01/21/20 1109  WBC 11.9*  HGB 11.2*  HCT 37.9  MCV 85.9  PLT 199    Cardiac Enzymes: No results for input(s): CKTOTAL, CKMB, CKMBINDEX, TROPONINI in the last 168  hours.  Lipid Panel: No results for input(s): CHOL, TRIG, HDL, CHOLHDL, VLDL, LDLCALC in the last 168 hours.  CBG: Recent Labs  Lab 01/21/20 1550 01/21/20 2059 01/22/20 0836  GLUCAP 103* 120* 77    Microbiology: Results for orders placed or performed during the hospital encounter of 01/21/20  Urine Culture     Status: None   Collection Time: 01/21/20  1:10 PM   Specimen: Urine, Random  Result Value Ref Range Status   Specimen Description   Final    URINE, RANDOM Performed at [facility], [address] [place], [place] [address]    Special Requests   Final    NONE Performed at [facility], [address] [place], [place] [address]    Culture   Final    NO GROWTH Performed at [facility], [place] [address], [place], [place] [address]    Report Status 01/22/2020 FINAL  Final  MRSA PCR Screening     Status: Abnormal   Collection Time: 01/21/20  7:18 PM   Specimen: Nasopharyngeal  Result Value Ref Range Status   MRSA by PCR POSITIVE (A) NEGATIVE  Final    Comment:        The GeneXpert MRSA Assay (FDA approved for NASAL specimens only), is one component of a comprehensive MRSA colonization surveillance program. It is not intended to diagnose MRSA infection nor to guide or monitor treatment for MRSA infections. RESULT CALLED TO, READ BACK BY AND VERIFIED WITH: KIM BOYCE @0011  01/22/20 MJU Performed at [facility], [address] [place], [place] [address]     Coagulation Studies: No results for input(s): LABPROT, INR in the last 72 hours.  Imaging: CT Head Wo Contrast  Result Date: 01/21/2020 CLINICAL DATA:  Seizure and abnormal neurologic exam. Three seizures this morning. EXAM: CT HEAD WITHOUT CONTRAST TECHNIQUE: Contiguous axial images were obtained from the base of the skull through the vertex without intravenous contrast. COMPARISON:  05/12/2019 FINDINGS: Brain: Ventricles, cisterns and other CSF spaces are normal. There  is moderate chronic ischemic microvascular disease. Minimal age related atrophic change. No mass, mass effect, shift of midline structures or acute hemorrhage. No evidence of acute infarction. Vascular: No hyperdense vessel or unexpected calcification. Skull: Normal. Negative for fracture or focal lesion. Sinuses/Orbits: No acute finding. Other: None. IMPRESSION: 1.  No acute findings. 2. Chronic ischemic microvascular disease and minimal age related atrophic change. Electronically Signed   By: Marin Olp M.D.   On: 01/21/2020 11:36    Medications:  I have reviewed the patient's current medications. Scheduled: . Chlorhexidine Gluconate Cloth  6 each Topical Q0600  . [START ON 01/24/2020] epoetin (EPOGEN/PROCRIT) injection  4,000 Units Intravenous Q M,W,F-HD  . feeding supplement (NEPRO CARB STEADY)  1,000 mL Per Tube Q24H  . [START ON 01/23/2020] feeding supplement (PRO-STAT SUGAR FREE 64)  30 mL Per Tube Daily  . free water  50 mL Per Tube Q4H  . heparin  5,000 Units Subcutaneous Q8H  . insulin aspart  0-6 Units Subcutaneous TID WC  . multivitamin  1 tablet Per Tube QHS  . mupirocin ointment  1 application Nasal BID  . potassium & sodium phosphates  1 packet Per Tube TID WC & HS  . potassium chloride  20 mEq Per Tube Once  . senna  1 tablet Oral BID    Assessment/Plan: 79 y.o. female who is unable to provide any history.  History from facility is minimal.  Patient with a history of multiple medical problems including ESRD on HD MWF, hyperlipidemia, type 2 diabetes, COPD, asthma, GERD, bladder cancer,multiple episodes of altered mental statusand seizure disorder who presents from her facility after being noted to have 3 episodes of seizure today.  Description not available.  Patient on Vimpat.  Last dose was last evening.  Received Ativan at the facility.  Now poorly responsive.  Head CT personally reviewed and shows no acute changes.   Patient has been seen by neurology multiple times over  the past year for altered mental status, all of which have been prolonged.  Has had multiple work ups for seizure as well, all of which have been unremarkable.  Per notes since last discharge patient has continued to go downhill, has been declining in alertness and function but did seem to be eating more with assistance.  Continued with episodes of hypoactive delirium.  Today patient improved.  Appears to be tolerating increased dose of Vimpat.   Serum phosphorus 1.2.  Serum magnesium is pending.    Recommendations: 1. Continue Vimpat at 110m BID  2. Seizure precautions 3. Agree with addressing metabolic/infectious issues.   4. EEG not necessary  at this time with noted patient improvement.     LOS: 1 day   Alexis Goodell, MD Neurology [phone] 01/22/2020  12:12 PM

## 2020-01-22 NOTE — Progress Notes (Signed)
This note also relates to the following rows which could not be included: Pulse Rate - Cannot attach notes to unvalidated device data Resp - Cannot attach notes to unvalidated device data SpO2 - Cannot attach notes to unvalidated device data  Hd started

## 2020-01-22 NOTE — Progress Notes (Signed)
Patient restless, unable to keep her access arm in place. I have decreased bfr for patient safety

## 2020-01-22 NOTE — Progress Notes (Signed)
Initial Nutrition Assessment  DOCUMENTATION CODES:   Not applicable  INTERVENTION:   Nepro @ 85m/hr- Initiate at 235mhr and increase by 1021mr q 12 hours until goal rate is reached.   Prostat liquid protein 30 ml daily via tube, each supplement provides 100 kcal, 15 grams protein.  Free water flushes 41m37m hours to maintain tube patency   Regimen provides 1828kcal/day, 92g/day protein and 998ml70m free water daily   Rena-vite daily via tube   Pt is at high refeed risk; recommend monitor K, Mg and P labs daily until stable.   NUTRITION DIAGNOSIS:   Inadequate oral intake related to dysphagia as evidenced by other (comment) (pt with chronic PEG).  GOAL:   Patient will meet greater than or equal to 90% of their needs  MONITOR:   Labs, Weight trends, TF tolerance, Skin, I & O's  REASON FOR ASSESSMENT:   Consult Enteral/tube feeding initiation and management  ASSESSMENT:   78 ye43 old female with PMHx of COPD, HTN, arthritis, GERD, CAD, asthma, ESRD on HD, celiac disease, DM, sleep apnea, bladder cancer s/p chemo/XRT, recent left total hip arthroplasty on 09/05/2019, dysphagia s/p PEG tube placement on 09/28/2019 who is admitted from [facility] for seizure.   Met with pt in room today. Pt is well known to nutrition department and this RD from multiple previous admits. Pt with chronic dysphagia s/p PEG tube placement in March of this year. Pt is non-verbal. Spoke with RN at [facility]. Pt has been eating a dysphagia (advanced) diet at [facility] and has not been receiving any feeds via her tube, only flushes. Pt has been receiving nutritional supplements. Per chart, pt is down 19lbs(11%) since March; this is significant. RD suspects pt is not eating enough by mouth to meet her estimated needs. Would recommend continue tube feeds after discharge and provide comfort feeds if deemed safe by SLP. Will initiate Nepro today as pt has tolerated this in the past. Pt is at high  refeed risk.   Medications reviewed and include: heparin, insulin, KPhos, KCl, senokot  Labs reviewed: K 2.7(L), creat 3.34(H), Ca 6.0(L) adj. 7.84(L), P 1.2(L), alb 1.2(L) Wbc- 11.9(H) cbgs- 103, 120, 77 x 24 hrs AIC 5.0- 7/4  NUTRITION - FOCUSED PHYSICAL EXAM:    Most Recent Value  Orbital Region No depletion  Upper Arm Region Mild depletion  Thoracic and Lumbar Region No depletion  Buccal Region No depletion  Temple Region No depletion  Clavicle Bone Region No depletion  Clavicle and Acromion Bone Region No depletion  Scapular Bone Region No depletion  Dorsal Hand No depletion  Patellar Region Mild depletion  Anterior Thigh Region Mild depletion  Posterior Calf Region Mild depletion  Edema (RD Assessment) None  Hair Reviewed  Eyes Reviewed  Mouth Reviewed  Skin Reviewed  Nails Reviewed     Diet Order:   Diet Order            Diet NPO time specified  Diet effective now                EDUCATION NEEDS:   No education needs have been identified at this time  Skin:  Skin Assessment: Reviewed RN Assessment (Stage I sacrum)  Last BM:  7/5- TYPE 5  Height:   Ht Readings from Last 1 Encounters:  01/22/20 5' 4"  (1.626 m)    Weight:   Wt Readings from Last 1 Encounters:  01/21/20 67.3 kg    Ideal Body Weight:  54.5 kg  BMI:  Body mass index is 25.47 kg/m.  Estimated Nutritional Needs:   Kcal:  1600-1800kcal/day  Protein:  80-90g/day  Fluid:  UOP+1L  Koleen Distance MS, RD, LDN Please refer to [facility] for RD and/or RD on-call/weekend/after hours pager

## 2020-01-22 NOTE — Progress Notes (Signed)
[facility], [place] 01/22/20  Subjective:   LOS: 1 Patient was admitted from [facility] after she had 3 seizures there.  She was altered mental status when she arrived.  Was not able to provide much information.  She was treated medically and with IV antiepileptics.  She was also found to have electrolyte abnormalities including low potassium and phosphorus. This morning she is more awake Able to answer a few simple questions    Objective:  Vital signs in last 24 hours:  Temp:  [97.8 F (36.6 C)-99.2 F (37.3 C)] 97.8 F (36.6 C) (07/05 0837) Pulse Rate:  [60-110] 77 (07/05 0837) Resp:  [11-27] 19 (07/04 2029) BP: (147-189)/(60-104) 166/60 (07/05 0837) SpO2:  [96 %-100 %] 99 % (07/04 2029) Weight:  [67.3 kg] 67.3 kg (07/04 1055)  Weight change:  Filed Weights   01/21/20 1055  Weight: 67.3 kg    Intake/Output:    Intake/Output Summary (Last 24 hours) at 01/22/2020 0762 Last data filed at 01/22/2020 0609 Gross per 24 hour  Intake 912.74 ml  Output --  Net 912.74 ml     Physical Exam: General:  Frail, elderly woman, laying in the bed  HEENT  moist oral mucous membranes  Pulm/lungs  normal breathing effort, no crackles  CVS/Heart  no rub  Abdomen:   Soft, nontender, nondistended  Extremities:  Trace edema  Neurologic:  Alert, able to answer few simple questions  Skin:  No acute rashes  Access:  Left arm AV graft       Basic Metabolic Panel:  Recent Labs  Lab 01/21/20 1204 01/21/20 1809  NA 141  --   K 2.7*  --   CL 113*  --   CO2 21*  --   GLUCOSE 83  --   BUN 19  --   CREATININE 3.34*  --   CALCIUM 6.0*  --   PHOS  --  1.2*     CBC: Recent Labs  Lab 01/21/20 1109  WBC 11.9*  HGB 11.2*  HCT 37.9  MCV 85.9  PLT 199      Lab Results  Component Value Date   HEPBSAG NON REACTIVE 10/09/2019      Microbiology:  Recent Results (from the past 240 hour(s))  MRSA PCR Screening     Status: Abnormal    Collection Time: 01/21/20  7:18 PM   Specimen: Nasopharyngeal  Result Value Ref Range Status   MRSA by PCR POSITIVE (A) NEGATIVE Final    Comment:        The GeneXpert MRSA Assay (FDA approved for NASAL specimens only), is one component of a comprehensive MRSA colonization surveillance program. It is not intended to diagnose MRSA infection nor to guide or monitor treatment for MRSA infections. RESULT CALLED TO, READ BACK BY AND VERIFIED WITH: KIM BOYCE @0011  01/22/20 MJU Performed at [facility], [address] [place], [place] [address]     Coagulation Studies: No results for input(s): LABPROT, INR in the last 72 hours.  Urinalysis: Recent Labs    01/21/20 1204  COLORURINE YELLOW*  LABSPEC 1.005  PHURINE 9.0*  GLUCOSEU 50*  HGBUR MODERATE*  BILIRUBINUR NEGATIVE  KETONESUR NEGATIVE  PROTEINUR 100*  NITRITE NEGATIVE  LEUKOCYTESUR LARGE*      Imaging: CT Head Wo Contrast  Result Date: 01/21/2020 CLINICAL DATA:  Seizure and abnormal neurologic exam. Three seizures this morning. EXAM: CT HEAD WITHOUT CONTRAST TECHNIQUE: Contiguous axial images were obtained from the base of the skull  through the vertex without intravenous contrast. COMPARISON:  05/12/2019 FINDINGS: Brain: Ventricles, cisterns and other CSF spaces are normal. There is moderate chronic ischemic microvascular disease. Minimal age related atrophic change. No mass, mass effect, shift of midline structures or acute hemorrhage. No evidence of acute infarction. Vascular: No hyperdense vessel or unexpected calcification. Skull: Normal. Negative for fracture or focal lesion. Sinuses/Orbits: No acute finding. Other: None. IMPRESSION: 1.  No acute findings. 2. Chronic ischemic microvascular disease and minimal age related atrophic change. Electronically Signed   By: Marin Olp M.D.   On: 01/21/2020 11:36     Medications:   . sodium chloride 75 mL/hr (01/22/20 0609)   . Chlorhexidine Gluconate Cloth  6  each Topical Q0600  . heparin  5,000 Units Subcutaneous Q8H  . insulin aspart  0-6 Units Subcutaneous TID WC  . mupirocin ointment  1 application Nasal BID  . senna  1 tablet Oral BID   acetaminophen **OR** acetaminophen, bisacodyl, hydrALAZINE, LORazepam  Assessment/ Plan:  79 y.o. female with  was admitted on 01/21/2020 for  Principal Problem:   Seizure [facility]) Active Problems:   Leukocytosis   Anemia in chronic kidney disease   COPD (chronic obstructive pulmonary disease) (HCC)   CAD in native artery   Benign essential HTN   Protein-calorie malnutrition, severe   ESRD (end stage renal disease) ([place])   Metabolic acidosis   Acute metabolic encephalopathy   Type II diabetes mellitus with renal manifestations (HCC)   Sacral ulcer (HCC)   Chronic diastolic CHF (congestive heart failure) (HCC)   Pressure injury of skin   Hypokalemia   Hypocalcemia   Abnormal urinalysis   Elevated troponin   Failure to thrive in adult   Seizures (HCC)  Hypocalcemia [E83.51] Hypokalemia [E87.6] Seizure ([place]) [R56.9] Seizures ([place]) [R56.9] ESRD (end stage renal disease) ([place]) [N18.6]   [facility] M[facility] LUE AVG 59.5 kg  #. ESRD with hypokalemia Consent obtained from patient's daughter Routine hemodialysis today; 4 k Discontinue IV fluid supplementation at the end of the day  #. Anemia of CKD  Lab Results  Component Value Date   HGB 11.2 (L) 01/21/2020   Low dose EPO with HD  #. Secondary hyperparathyroidism of renal origin N 25.81      Component Value Date/Time   PTH 310 (H) 09/06/2019 0850   Lab Results  Component Value Date   PHOS 1.2 (L) 01/21/2020   Monitor calcium and phos level during this admission Phosphorus level is expected to improve once oral intake is adequate Avoid binders at present  #Seizure disorder Improved symptomatically with medical management   LOS: [place] 7/5/20218:38 AM  [place],  [place]

## 2020-01-22 NOTE — Significant Event (Signed)
Rapid Response Event Note  Overview: Time Called: [place] Time: 9702 Event Type: Neurologic  Initial Focused Assessment: Patient found having continuous seizure- per bedside RN- patient at the time I arrived has had a seizure from approximately 12 min.  Interventions: Dr. Roosevelt Locks was notified- patient given a total of 4 doses of ativan and keppra was ordered. Plan of Care (if not transferred): At this time- Since patient is a DNR- Dr. Roosevelt Locks stated to administer the [place] and the night hospitalist will see the patient.  Keppra is getting administered at this time- I will notified night charge nurse about this patient as a possible transfer to the ICU. Event Summary:   at      at          [facility]

## 2020-01-22 NOTE — Consult Note (Signed)
[facility] TeleNeurology Consult Services  Stat Consult  Date of Service:   01/22/2020 19:51:22  Impression:     .  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 It sounds like the intensivist spoke to [facility] neurology who recommended a Dilantin load which is not unreasonable 1350 mg requested. Can check a level I hour afteer infusion is completed. The patient has atrial fibrillation do recommend infusing at half rate. the only other option would be Depakote load if this fails as the patient understandably wants the patient to remain DNR DNI at this point the patient is clinically seizing stat EEG unlikely to be helpful. If her seizures. Clinically and she is not waking up consideration for stat EEG could be made to know what to do with her medications. Goals of care attempting to be further established with the family. Prognosis seems very poor at this point. Sounds like the patient has had a significant functional decline for some time now. if the patient continues to seize can certainly reconsult tele-neurology if needed. continue keppra 1 gram daily she is on dialysis so need supplemenation post dialysis  continue vimpat    CT HEAD: Not Reviewed not performed recently  Metrics: [facility] Notification Time: 01/22/2020 19:49:32 Stamp Time: 01/22/2020 19:51:22 Callback Response Time: 01/22/2020 19:53:18     Disposition: Neurology Follow Up Recommended  Sign Out:     .  Discussed with Emergency Department Provider  ----------------------------------------------------------------------------------------------------  Chief Complaint: seiziures  History of Present Illness: Patient is a 79 year old  Female.  The patients is an elderly woman with a history of ESRD on HD, COPD, HLD, DM, COPD, bladder cancer and seizure disorder. she apparently had 3 seizures at the facility where she lives at vomiting her to come in to the hospital. She had a seizure at 16:15 then in a minutes had a fully convulsion lasting 4 minutes then given ativan 30m it stopped and she was resting comfortably then again another focal spell given more Ativan then 45 miin later another seizure mild tremoring more ativan. At 18:25 another seizure given 2 more ativan 2 loading doses of keppra 2 grams today on vimpat also 1579mBID. Shes in a-fib with some tachycardia. she's been continuing to have face and jaw twitching and sometimes rightt upper extremity twitching as well. Because of the continued movement stat neurology consultation requested. [facility] rec[facility]mended loading her with Dilantin which is not unreasonable.   Past Medical History:     . Diabetes Mellitus     . Hyperlipidemia     . Atrial Fibrillation     . Coronary Artery Disease  Anticoagulant use:  No     Examination: BP(240/68), Pulse(44), Blood Glucose(77)  Neuro Exam:    General: The patient is lethargic her head is turned to the right she has some face and jaw twitching She does not follow any commands no verbal output No obvious rhythmic movements of her arms and legs    Patient is being evaluated for possible acute neurologic impairment and high probability of imminent or life-threatening deterioration. I spent total of 35 minutes providing care to this patient, including time for face to face visit via telemedicine, review of medical records, imaging studies and discussion of findings with providers,  the patient and/or family.   Dr Katina Degree   [facility] [phone]  Case [id number]

## 2020-01-22 NOTE — Progress Notes (Addendum)
PROGRESS NOTE    Barbara [name]  QBV:[id number] DOB: 1940-11-14 DOA: 01/21/2020 PCP: Juluis Pitch, MD   Chief complaint.  Seizure. Brief Narrative:  Barbara [name] is a 79 y.o. female with medical history significant of ESRD on dialysis, seizure disorder on Vimpat, CAD, HTN, HLD, COPD, asthma, dCHF , T2DM, GERD, bladder cancer who presented to the ED via EMS from her facility today due to seizure activity.  She had 3 episodes of seizure in the nursing home.  She is started on Vimpat per neurology recommendation.    Assessment & Plan:   Principal Problem:   Seizure ([place]) Active Problems:   Leukocytosis   Anemia in chronic kidney disease   COPD (chronic obstructive pulmonary disease) (HCC)   CAD in native artery   Benign essential HTN   Protein-calorie malnutrition, severe   ESRD (end stage renal disease) (HCC)   Metabolic acidosis   Acute metabolic encephalopathy   Type II diabetes mellitus with renal manifestations (HCC)   Sacral ulcer (HCC)   Chronic diastolic CHF (congestive heart failure) (HCC)   Pressure injury of skin   Hypokalemia   Hypocalcemia   Abnormal urinalysis   Elevated troponin   Failure to thrive in adult   Seizures ([place])  #1.  Breakthrough seizures with seizure disorder. Per Dr. Doy Mince recommendation, started on Vimpat IV.  Spoke with the nurse, no additional seizures since arriving the hospital.  Patient still has been sleepy, concern for aspiration.  Will obtain speech therapy evaluation.  Patient has a feeding tube in place, will consult nutrition and start tube feeding until patient can take n.p.o.  #2.  Acute metabolic encephalopathy. Most likely postictal confusion.  No evidence of infection.  Patient is waking up today, will obtain speech therapy evaluation.  3.  Hypokalemia and hypophosphatemia. Discussed with Dr. Candiss Norse, will give gentle supplements.  Recheck level tomorrow.  4.  Mild elevation troponin. Secondary to end-stage renal disease.   Follow.  5.  Failure to thrive with severe protein calorie malnutrition. Dietitian consult.  6.  End-stage renal disease. Followed by nephrology for hemodialysis.  7.  Type 2 diabetes per Continue sliding scale insulin.  8.  Essential hypertension. Continue home medicines.   1850. Patient developed status epilecticus, will give more ativan and load with 1017m of keppra.   DVT prophylaxis: Heparin. Code Status: DNR Family Communication: None Disposition Plan:   Patient came from: SNF             Anticipated d/c place:SNF  Barriers to d/c OR conditions which need to be met to effect a safe d/c:   Consultants:   Nephrology and neurology  Procedures: HD Antimicrobials: None Subjective: Patient is very confused, only oriented to herself.  Spoke with the nurse, she is not safe for swallow.  No shortness of breath or cough.  Objective: Vitals:   01/21/20 1600 01/21/20 1742 01/21/20 2029 01/22/20 0837  BP: (!) 147/77 (!) 185/71 (!) 158/95 (!) 166/60  Pulse:  60 91 77  Resp: (!) 21 (!) 27 19   Temp:  99.2 F (37.3 C) 98.3 F (36.8 C) 97.8 F (36.6 C)  TempSrc:  Oral  Oral  SpO2: 100% 98% 99% 96%  Weight:        Intake/Output Summary (Last 24 hours) at 01/22/2020 0956 Last data filed at 01/22/2020 0838 Gross per 24 hour  Intake 1096.39 ml  Output --  Net 1096.39 ml   Filed Weights   01/21/20 1055  Weight: 67.3 kg  Examination:  General exam: Appears calm and comfortable  Respiratory system: Clear to auscultation. Respiratory effort normal. Cardiovascular system: S1 & S2 heard, RRR. No JVD, murmurs, rubs, gallops or clicks. No pedal edema. Gastrointestinal system: Abdomen is nondistended, soft and nontender. No organomegaly or masses felt. Normal bowel sounds heard. Central nervous system: Alert and oriented x1. No focal neurological deficits. Extremities: Symmetric  Skin: No rashes, lesions or ulcers Psychiatry: Confused.    Data Reviewed: I have  personally reviewed following labs and imaging studies  CBC: Recent Labs  Lab 01/21/20 1109  WBC 11.9*  HGB 11.2*  HCT 37.9  MCV 85.9  PLT 976   Basic Metabolic Panel: Recent Labs  Lab 01/21/20 1204 01/21/20 1809  NA 141  --   K 2.7*  --   CL 113*  --   CO2 21*  --   GLUCOSE 83  --   BUN 19  --   CREATININE 3.34*  --   CALCIUM 6.0*  --   PHOS  --  1.2*   GFR: Estimated Creatinine Clearance: 12.7 mL/min (A) (by C-G formula based on SCr of 3.34 mg/dL (H)). Liver Function Tests: Recent Labs  Lab 01/21/20 1204  AST 9*  ALT <5  ALKPHOS 54  BILITOT 0.4  PROT 5.4*  ALBUMIN 1.7*   Recent Labs  Lab 01/21/20 1204  LIPASE 15   No results for input(s): AMMONIA in the last 168 hours. Coagulation Profile: No results for input(s): INR, PROTIME in the last 168 hours. Cardiac Enzymes: No results for input(s): CKTOTAL, CKMB, CKMBINDEX, TROPONINI in the last 168 hours. BNP (last 3 results) No results for input(s): PROBNP in the last 8760 hours. HbA1C: Recent Labs    01/21/20 1409  HGBA1C 5.0   CBG: Recent Labs  Lab 01/21/20 1550 01/21/20 2059 01/22/20 0836  GLUCAP 103* 120* 77   Lipid Profile: No results for input(s): CHOL, HDL, LDLCALC, TRIG, CHOLHDL, LDLDIRECT in the last 72 hours. Thyroid Function Tests: No results for input(s): TSH, T4TOTAL, FREET4, T3FREE, THYROIDAB in the last 72 hours. Anemia Panel: No results for input(s): VITAMINB12, FOLATE, FERRITIN, TIBC, IRON, RETICCTPCT in the last 72 hours. Sepsis Labs: No results for input(s): PROCALCITON, LATICACIDVEN in the last 168 hours.  Recent Results (from the past 240 hour(s))  MRSA PCR Screening     Status: Abnormal   Collection Time: 01/21/20  7:18 PM   Specimen: Nasopharyngeal  Result Value Ref Range Status   MRSA by PCR POSITIVE (A) NEGATIVE Final    Comment:        The GeneXpert MRSA Assay (FDA approved for NASAL specimens only), is one component of a comprehensive MRSA  colonization surveillance program. It is not intended to diagnose MRSA infection nor to guide or monitor treatment for MRSA infections. RESULT CALLED TO, READ BACK BY AND VERIFIED WITH: Lear Ng @0011  01/22/20 MJU Performed at [facility], [address], [place], [place] [address]          Radiology Studies: CT Head Wo Contrast  Result Date: 01/21/2020 CLINICAL DATA:  Seizure and abnormal neurologic exam. Three seizures this morning. EXAM: CT HEAD WITHOUT CONTRAST TECHNIQUE: Contiguous axial images were obtained from the base of the skull through the vertex without intravenous contrast. COMPARISON:  05/12/2019 FINDINGS: Brain: Ventricles, cisterns and other CSF spaces are normal. There is moderate chronic ischemic microvascular disease. Minimal age related atrophic change. No mass, mass effect, shift of midline structures or acute hemorrhage. No evidence of acute infarction. Vascular:  No hyperdense vessel or unexpected calcification. Skull: Normal. Negative for fracture or focal lesion. Sinuses/Orbits: No acute finding. Other: None. IMPRESSION: 1.  No acute findings. 2. Chronic ischemic microvascular disease and minimal age related atrophic change. Electronically Signed   By: Marin Olp M.D.   On: 01/21/2020 11:36        Scheduled Meds:  Chlorhexidine Gluconate Cloth  6 each Topical Q0600   heparin  5,000 Units Subcutaneous Q8H   insulin aspart  0-6 Units Subcutaneous TID WC   mupirocin ointment  1 application Nasal BID   senna  1 tablet Oral BID   Continuous Infusions:  lacosamide (VIMPAT) IV       LOS: 1 day    Time spent: 28 minutes    Sharen Hones, MD [facility]   To contact the attending provider between 7A-7P or the covering provider during after hours 7P-7A, please log into the web site [email or web address] and access using universal [place] password for that web site. If you do not have the password, please call the hospital  operator.  01/22/2020, 9:56 AM

## 2020-01-22 NOTE — Progress Notes (Signed)
This note also relates to the following rows which could not be included: Resp - Cannot attach notes to unvalidated device data BP - Cannot attach notes to unvalidated device data  Hd completed

## 2020-01-22 NOTE — Progress Notes (Addendum)
Patient had 2 seizures back to back after returning to unit from HD.   V.S. and blood sugar checked.   Blood sugar was 53-  26m of Dex given.   Heart rate jumped up to 140s-150s.    1st seizure last about 1 minutes-  Small jerking/twitching like motions (upper body) with eyes rolled to right.     2nd seizure lasted about 4 minutes-  Full body jerking, jaw locked, eyes still rolled back and to right.    Ativan given, mouth suctioned, oxygen placed, seizure pads in place.    MD was made aware   1706 - 3rd seizure like activity noted (similar to 1st)   Small jerking twitching motions of upper body.   Lasted about 1 minute (from time witnessed)-  Walked into patient room and she was seizing   MD was mad aware

## 2020-01-22 NOTE — Progress Notes (Signed)
Notified by Dr. Danford Bad (daytime hospitalist) to evaluate this patient for seizures.   Pt was admitted on 7/4 for multiple seizures at her nursing facility. She has hx of seizures on Vimpat.   Nursing reports that she had a total of 3 seizures earlier today. First one was partial with right upper extremity twitching. Second was generalized tonic-clonic. Third again was partial with UE twitching. Each of these lasted about 5-6 minutes. She has received numerous Ativan doses in between these seizures and Keppra loading dose and infusion was ordered by Dr. Danford Bad.   On my evaluation, patient was unresponsive, normotensive and was placed on 5L of O2. Showing signs of aspiration but still able to maintain airway. Continued to have right UE twitching.   I discussed case with Neurologist Dr. Lorraine Lax at [facility] and he made generalized recommendation to give another 1g of Keppra load and to start Fosphenytoin. He recommends tele-neuro to evaluate her at bedside and can consider transfer to [facility] for continuous EEG monitor if she is stable.   I discussed with pharmacy for stat dose of Fosphenytoin but unfortunately we are at low supply and will need to start Phenytoin instead.   Spoke with Dr. Karle Barr with tele-neuro at bedside and she recommends continuing with Dilaudid but at half-dose since patient is going into rate controlled atrial fibrillation. EKG 12-lead pending. Could consider Depakote load if this fails.   I spoke with family  , Norberto Sorenson (daughter), regarding her status epilecticus and her prognosis. Family reconfirms that patient is a DNR/DNI if she is unable to protect her airway. They would like to wait on how she does with Phenytoin before deciding on comfort care.   Pt to transfer to ICU. Currently hemodynamically stable. Will monitor throughout tonight and will update family.   Norberto Sorenson (daughter) [phone] Langston Reusing (grand-daughter) [phone]

## 2020-01-22 NOTE — Progress Notes (Signed)
Granddaughter in room- discussed plan of care.

## 2020-01-22 NOTE — Progress Notes (Signed)
[facility] hospital Liaison note:  Patient is currently followed by [facility] program at [facility]. TOC Telford Nab made aware.  Flo Shanks BSN, RN, [place] [phone]

## 2020-01-22 NOTE — Consult Note (Signed)
Consultation Note Date: 01/22/2020   Patient Name: Barbara [name]  DOB: September 15, 1940  MRN: [id number]  Age / Sex: 79 y.o., female  PCP: Juluis Pitch, MD Referring Physician: Sharen Hones, MD  Reason for Consultation: Establishing goals of care and Psychosocial/spiritual support  HPI/Patient Profile: 79 y.o. female  with past medical history of ESRD on HD, seizure disorder on Vimpat, CAD, HTN/HLD, COPD, asthma, D CHF, DM 2, GERD, bladder cancer with history of chemo and radiation, long-term care at [facility] admitted on 01/21/2020 with seizure.   Clinical Assessment and Goals of Care: Barbara [name] is resting quietly in bed.  She wakes easily when I call her name.  She is noted to be mostly nonverbal but will respond with "uhuh and un huh" (yes and no).  Barbara [name] appears acutely/chronically ill and frail.  She does wince when I touch her foot, but is unable to tell me if she is having pain.  She is unable to make her basic needs known, it seems that she is unable to even move herself in the bed at this point.  There is no family at bedside at this time.  Conference with RD at bedside who shares that Barbara [name] had a PEG tube placed March of this year, she removed it, and it was replaced.  Barbara [name] has had a 19 pound weight loss over the last 3 months.  It seems that she has been receiving by mouth feeding.  Tube feeding initiated.  Call to daughter, Norberto Sorenson.  As we are talking Barbara [name] tells me that her social worker with hospice is at her house.  We talked about Barbara [name] acute and chronic health concerns.  We talked about her seizure disorder, and increasing antiseizure medications.  Admission shares that her mother was placed in [facility] in February of this year.  We talked about PEG tube placement in March, pulling the PEG tube and then having it replaced.  I share with Barbara [name] that Ms. [name] has had  a 19 pound weight loss in the last 3 months, and we will start feeding her via tube.  We talked about all that Barbara [name] has gone through.  Barbara [name] shares, "she's tired, tired of suffering".  Barbara [name] tells me that her mother has been saying that she was "ready to go".  She shares that Barbara [name] started saying this just before going into [facility] in [facility].  We talk about stopping HD, what this would look like and feel like for Ms. [name].  I share that modern medicine can prolong life, but can also prolong the dying process, and thereby prolong suffering.  Admission years that Barbara [name] her husband will be buried tomorrow, he died last 2022/09/22.  Barbara [name] endorses that she does not want to see her mother suffer, but also states, "I don't want to bury my mother".  I encourage Barbara [name] to think about, pray about the best way to care for Barbara [name].  She takes the palliative team phone number.  PMT to  continue to follow.  Conference with attending, bedside nursing staff, RD, speech therapy, transition of care team related to patient condition, needs, goals of care, disposition.   HCPOA    NEXT OF KIN - daughter Barbara [name] is POA after her father died last 10/13/22.    SUMMARY OF RECOMMENDATIONS   Continue to treat the treatable Considering comfort care   Code Status/Advance Care Planning:  DNR  Symptom Management:   Per hospitalist, no additional needs at this time.  Palliative Prophylaxis:   Frequent Pain Assessment, Oral Care and Turn Reposition  Additional Recommendations (Limitations, Scope, Preferences):  Continue to treat the treatable but no CPR or intubation  Psycho-social/Spiritual:   Desire for further Chaplaincy support:no  Additional Recommendations: Caregiving  Support/Resources and Education on Hospice  Prognosis:   Unable to determine, based on outcomes.  6 months or less would not be surprising based on 4 hospitalizations and 1 ED visit in 6 months, chronic illness burden,  chronic ARS stay on HD, 19 pound weight loss in 3 months.  Discharge Planning: Anticipate [facility], [facility]-term care, palliative services already in place.      Primary Diagnoses: Present on Admission: . Hypokalemia . Hypocalcemia . Abnormal urinalysis . Elevated troponin . Acute metabolic encephalopathy . Anemia in chronic kidney disease . Benign essential HTN . CAD in native artery . Chronic diastolic CHF (congestive heart failure) ([place]) . ESRD (end stage renal disease) ([place]) . Leukocytosis . Metabolic acidosis . Pressure injury of skin . Protein-calorie malnutrition, severe . Failure to thrive in adult . Type II diabetes mellitus with renal manifestations ([place]) . COPD (chronic obstructive pulmonary disease) ([place]) . Sacral ulcer ([place])   I have reviewed the medical record, interviewed the patient and family, and examined the patient. The following aspects are pertinent.  Past Medical History:  Diagnosis Date  . Adult celiac disease   . Anemia associated with chronic renal failure 2017   blood transfusion last week 10/17  . Arthritis   . Asthma   . Cancer ([place]) 2017   bladder  . Chronic kidney disease   . 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  dr. Candiss Norse and dr. Holley Raring follow her  . COPD (chronic obstructive pulmonary disease) ([place])   . Coronary artery disease   . Diabetes mellitus without complication ([place])   . Dialysis patient ([place])    Tues, Thurs, Sat  . Dyspnea    with exertion  . Elevated lipids   . GERD (gastroesophageal reflux disease)   . Hematuria   . Hypertension   . Lower back pain   . Neuropathy   . Oxygen dependent    at hs  . Personal history of chemotherapy 2017   bladder ca  . Personal history of radiation therapy 2017   bladder ca  . Sleep apnea    uses cpap  . Urinary obstruction 01/2016   Social History   Socioeconomic History  . Marital status: Married    Spouse name: Not on file  . Number of children: Not  on file  . Years of education: Not on file  . Highest education level: Not on file  Occupational History  . Occupation: retired  Tobacco Use  . Smoking status: Former Smoker    Types: Cigarettes    Quit date: 07/20/2002    Years since quitting: 17.5  . Smokeless tobacco: Never Used  Vaping Use  . Vaping Use: Never used  Substance and Sexual Activity  .  Alcohol use: No  . Drug use: No  . Sexual activity: Never  Other Topics Concern  . Not on file  Social History Narrative  . Not on file   Social Determinants of Health   Financial Resource Strain:   . Difficulty of Paying Living Expenses:   Food Insecurity:   . Worried About Charity fundraiser in the Last Year:   . Arboriculturist in the Last Year:   Transportation Needs:   . Film/video editor (Medical):   Marland Kitchen Lack of Transportation (Non-Medical):   Physical Activity:   . Days of Exercise per Week:   . Minutes of Exercise per Session:   Stress:   . Feeling of Stress :   Social Connections:   . Frequency of Communication with Friends and Family:   . Frequency of Social Gatherings with Friends and Family:   . Attends Religious Services:   . Active Member of Clubs or Organizations:   . Attends Archivist Meetings:   Marland Kitchen Marital Status:    Family History  Problem Relation Age of Onset  . Diabetes Mother   . Cancer Father   . Breast cancer Neg Hx   . Kidney disease Neg Hx    Scheduled Meds: . Chlorhexidine Gluconate Cloth  6 each Topical Q0600  . [START ON 01/24/2020] epoetin (EPOGEN/PROCRIT) injection  4,000 Units Intravenous Q M,W,F-HD  . feeding supplement (NEPRO CARB STEADY)  1,000 mL Per Tube Q24H  . [START ON 01/23/2020] feeding supplement (PRO-STAT SUGAR FREE 64)  30 mL Per Tube Daily  . free water  50 mL Per Tube Q4H  . heparin  5,000 Units Subcutaneous Q8H  . insulin aspart  0-6 Units Subcutaneous TID WC  . multivitamin  1 tablet Per Tube QHS  . mupirocin ointment  1 application Nasal BID  .  potassium & sodium phosphates  1 packet Per Tube TID WC & HS  . potassium chloride  20 mEq Per Tube Once  . senna  1 tablet Oral BID   Continuous Infusions: . lacosamide (VIMPAT) IV 150 mg (01/22/20 1152)   PRN Meds:.acetaminophen **OR** acetaminophen, bisacodyl, hydrALAZINE, LORazepam Medications Prior to Admission:  Prior to Admission medications   Medication Sig Start Date End Date Taking? Authorizing Provider  albuterol (PROVENTIL HFA;VENTOLIN HFA) 108 (90 Base) MCG/ACT inhaler Inhale 2 puffs into the lungs every 6 (six) hours as needed for wheezing or shortness of breath.  05/16/18  Yes [provider]  Amino Acids-Protein Hydrolys (FEEDING SUPPLEMENT, PRO-STAT SUGAR FREE 64,) LIQD Place 30 mLs into feeding tube 2 (two) times daily. Patient taking differently: Take 30 mLs by mouth 2 (two) times daily.  09/24/19  Yes Nolberto Hanlon, MD  amLODipine (NORVASC) 5 MG tablet Take 5 mg by mouth daily.   Yes [provider]  atorvastatin (LIPITOR) 40 MG tablet Take 40 mg by mouth at bedtime.  05/01/19  Yes [provider]  B Complex-C (B-COMPLEX WITH VITAMIN C) tablet Place 1 tablet into feeding tube at bedtime. Patient taking differently: Take 1 tablet by mouth at bedtime.  09/24/19  Yes Nolberto Hanlon, MD  calcium acetate, Phos Binder, (PHOSLYRA) 667 MG/5ML SOLN Take 1,334 mg by mouth 3 (three) times daily with meals.    Yes [provider]  calcium carbonate (TUMS - DOSED IN MG ELEMENTAL CALCIUM) 500 MG chewable tablet Place 1 tablet into feeding tube at bedtime.    Yes [provider]  cholecalciferol (VITAMIN D) 1000 units  tablet Take 1 tablet (1,000 Units total) by mouth daily. 09/24/19  Yes Nolberto Hanlon, MD  docusate sodium (COLACE) 100 MG capsule Take 1 capsule (100 mg total) by mouth daily as needed for mild constipation. 10/17/19  Yes Dhungel, Nishant, MD  escitalopram (LEXAPRO) 5 MG tablet Take 5 mg by mouth daily. 12/19/19  Yes [provider]   esomeprazole (NEXIUM) 40 MG packet 40 mg daily before breakfast. Per tube   Yes [provider]  ferrous sulfate 300 (60 Fe) MG/5ML syrup Place 324 mg into feeding tube at bedtime.   Yes [provider]  gabapentin (NEURONTIN) 100 MG capsule Take 100 mg by mouth 3 (three) times daily. 01/18/20  Yes [provider]  heparin 5000 UNIT/ML injection Inject 1 mL (5,000 Units total) into the skin every 12 (twelve) hours. 09/24/19  Yes Nolberto Hanlon, MD  lacosamide 100 MG TABS Place 1 tablet (100 mg total) into feeding tube 2 (two) times daily. Patient taking differently: Take 100 mg by mouth 2 (two) times daily.  09/24/19  Yes Nolberto Hanlon, MD  magnesium hydroxide (MILK OF MAGNESIA) 400 MG/5ML suspension Take by mouth daily as needed for mild constipation.   Yes [provider]  metoCLOPramide (REGLAN) 5 MG/5ML solution Take 5 mg by mouth every 8 (eight) hours.    Yes [provider]  Multiple Vitamin (MULTI-VITAMIN) tablet Take 1 tablet by mouth daily.    Yes [provider]  traMADol (ULTRAM) 50 MG tablet Take 50 mg by mouth every 4 (four) hours as needed.   Yes [provider]   Allergies  Allergen Reactions  . Glipizide Er Other (See Comments)    Hypoglycemia   . Percocet [Oxycodone-Acetaminophen] Hives   Review of Systems  Unable to perform ROS: Patient nonverbal    Physical Exam Vitals and nursing note reviewed.  Constitutional:      General: She is not in acute distress.    Appearance: She is ill-appearing.     Comments: Mostly nonverbal, will briefly make eye contact, appears acutely/chronically ill and frail  HENT:     Mouth/Throat:     Mouth: Mucous membranes are moist.  Cardiovascular:     Rate and Rhythm: Normal rate.  Pulmonary:     Effort: Pulmonary effort is normal. No respiratory distress.  Abdominal:     General: Abdomen is flat.     Comments: PEG tube site  Skin:    General: Skin is warm and dry.   Neurological:     Comments: Mostly nonverbal, will sometimes reply in short sentences     Vital Signs: BP (!) 153/71   Pulse (!) 44   Temp 97.8 F (36.6 C) (Oral)   Resp 13   Ht 5' 4"  (1.626 m)   Wt 67.3 kg   SpO2 96%   BMI 25.47 kg/m  Pain Scale: 0-10   Pain Score: 0-No pain   SpO2: SpO2: 96 % O2 Device:SpO2: 96 % O2 Flow Rate: .   IO: Intake/output summary:   Intake/Output Summary (Last 24 hours) at 01/22/2020 1311 Last data filed at 01/22/2020 5784 Gross per 24 hour  Intake 1071.39 ml  Output --  Net 1071.39 ml    LBM: Last BM Date: 01/21/20 Baseline Weight: Weight: 67.3 kg Most recent weight: Weight: 67.3 kg     Palliative Assessment/Data:   Flowsheet Rows     Most Recent Value  Intake Tab  Referral Department Hospitalist  Unit at Time of Referral Cardiac/Telemetry Unit  Palliative Care Primary Diagnosis Neurology  Date Notified 01/21/20  Palliative Care Type New Palliative care  Reason for referral Clarify Goals of Care  Date of Admission 01/21/20  Date first seen by Palliative Care 01/22/20  # of days Palliative referral response time 1 Day(s)  # of days IP prior to Palliative referral 0  Clinical Assessment  Palliative Performance Scale Score 20%  Pain Max last 24 hours Not able to report  Pain Min Last 24 hours Not able to report  Dyspnea Max Last 24 Hours Not able to report  Dyspnea Min Last 24 hours Not able to report  Psychosocial & Spiritual Assessment  Palliative Care Outcomes      Time In: 1120 Time Out: 1230 Time Total: 70 minutes  Greater than 50%  of this time was spent counseling and coordinating care related to the above assessment and plan.  Signed by: Drue Novel, NP   Please contact Palliative Medicine Team phone at [phone] for questions and concerns.  For individual provider: See Shea Evans

## 2020-01-22 NOTE — Consult Note (Signed)
Name: Barbara [name] MRN: [id number] DOB: March 12, 1941    ADMISSION DATE:  01/21/2020 CONSULTATION DATE: 01/22/2020  REFERRING MD : Dr. Flossie Buffy  CHIEF COMPLAINT: Seizures   BRIEF PATIENT DESCRIPTION:  79 yo female with hx of ESRD on HD, Hyperlipidemia, Type II Diabetes Mellitus, COPD, GERD, Asthma, Bladder Cancer, and Seizure Activity on high dose Vimpat admitted to the telemetry unit 07/4 following 3 seizures required transfer to the stepdown unit on 07/5 following ongoing seizure activity on-and-off that has been refractory to multiple high doses of Keppra  SIGNIFICANT EVENTS  07/4: Pt admitted to the telemetry unit with seizure activity  07/4: CT Head revealed no acute findings. Chronic ischemic microvascular disease and minimal age related atrophic change. 07/5: Pt with ongoing seizure activity despite vimpat at 150 mg bid and multiple high doses of keppra.  Tele-neurology recommended dilantin load of 1350 mg x1 dose. However, if seizure activity continued recommended stat EEG to determine medication management in addition continued goals of care conversation due to pts poor prognosis.  Pt transferred to the stepdown unit for closer monitoring. Pts daughter REFUSED transfer to [facility] for continuous EEG monitoring at this time   PAST MEDICAL HISTORY :   has a past medical history of Adult celiac disease, Anemia associated with chronic renal failure (2017), Arthritis, Asthma, Cancer ([place]) (2017), Chronic kidney disease, CKD (chronic kidney disease), COPD (chronic obstructive pulmonary disease) ([place]), Coronary artery disease, Diabetes mellitus without complication ([place]), Dialysis patient ([place]), Dyspnea, Elevated lipids, GERD (gastroesophageal reflux disease), Hematuria, Hypertension, Lower back pain, Neuropathy, Oxygen dependent, Personal history of chemotherapy (2017), Personal history of radiation therapy (2017), Sleep apnea, and Urinary obstruction (01/2016).  has a past surgical history that  includes Abdominal hysterectomy; Rotator cuff repair (Left); Kidney surgery (01/21/2016); Cystoscopy with stent placement (Left, 01/21/2016); Transurethral resection of bladder tumor (N/A, 02/17/2016); Ureteroscopy (Left, 02/17/2016); Cystoscopy w/ retrogrades (Bilateral, 02/17/2016); Cardiac catheterization (N/A, 04/07/2016); Cystoscopy w/ ureteral stent placement (Left, 05/12/2016); Cystoscopy w/ ureteral stent placement (Left, 10/12/2016); Cystoscopy w/ retrogrades (Bilateral, 10/12/2016); Ureteroscopy (Right, 10/12/2016); Cystoscopy with stent placement (Right, 10/12/2016); DIALYSIS/PERMA CATHETER INSERTION (N/A, 11/20/2016); Portacath placement (Right); AV fistula placement (Left, 02/17/2017); DIALYSIS/PERMA CATHETER REMOVAL (N/A, 03/30/2017); PERIPHERAL VASCULAR THROMBECTOMY (Left, 06/02/2017); Cystoscopy with biopsy (N/A, 06/09/2017); Cystoscopy w/ retrogrades (Bilateral, 06/09/2017); Transurethral resection of bladder tumor (N/A, 10/07/2017); Cystoscopy w/ retrogrades (Bilateral, 10/07/2017); PERIPHERAL VASCULAR THROMBECTOMY (Left, 01/28/2018); PERIPHERAL VASCULAR THROMBECTOMY (Left, 03/14/2018); PERIPHERAL VASCULAR THROMBECTOMY (Left, 03/16/2018); TEMPORARY DIALYSIS CATHETER (N/A, 05/11/2018); A/V SHUNT INTERVENTION (N/A, 05/12/2018); TEMPORARY DIALYSIS CATHETER (Left, 07/27/2018); A/V SHUNT INTERVENTION (Left, 07/29/2018); PORTA CATH REMOVAL (N/A, 02/07/2019); Total hip arthroplasty (Left, 09/05/2019); PEG placement (N/A, 09/20/2019); Esophagogastroduodenoscopy (egd) with propofol (N/A, 09/28/2019); and Hip Closed Reduction (Left, 10/14/2019). Prior to Admission medications   Medication Sig Start Date End Date Taking? Authorizing Provider  albuterol (PROVENTIL HFA;VENTOLIN HFA) 108 (90 Base) MCG/ACT inhaler Inhale 2 puffs into the lungs every 6 (six) hours as needed for wheezing or shortness of breath.  05/16/18  Yes [provider]  Amino Acids-Protein Hydrolys (FEEDING SUPPLEMENT, PRO-STAT SUGAR FREE 64,) LIQD Place 30  mLs into feeding tube 2 (two) times daily. Patient taking differently: Take 30 mLs by mouth 2 (two) times daily.  09/24/19  Yes Nolberto Hanlon, MD  amLODipine (NORVASC) 5 MG tablet Take 5 mg by mouth daily.   Yes [provider]  atorvastatin (LIPITOR) 40 MG tablet Take 40 mg by mouth at bedtime.  05/01/19  Yes [provider]  B Complex-C (B-COMPLEX WITH VITAMIN C) tablet  Place 1 tablet into feeding tube at bedtime. Patient taking differently: Take 1 tablet by mouth at bedtime.  09/24/19  Yes Nolberto Hanlon, MD  calcium acetate, Phos Binder, (PHOSLYRA) 667 MG/5ML SOLN Take 1,334 mg by mouth 3 (three) times daily with meals.    Yes [provider]  calcium carbonate (TUMS - DOSED IN MG ELEMENTAL CALCIUM) 500 MG chewable tablet Place 1 tablet into feeding tube at bedtime.    Yes [provider]  cholecalciferol (VITAMIN D) 1000 units tablet Take 1 tablet (1,000 Units total) by mouth daily. 09/24/19  Yes Nolberto Hanlon, MD  docusate sodium (COLACE) 100 MG capsule Take 1 capsule (100 mg total) by mouth daily as needed for mild constipation. 10/17/19  Yes Dhungel, Nishant, MD  escitalopram (LEXAPRO) 5 MG tablet Take 5 mg by mouth daily. 12/19/19  Yes [provider]  esomeprazole (NEXIUM) 40 MG packet 40 mg daily before breakfast. Per tube   Yes [provider]  ferrous sulfate 300 (60 Fe) MG/5ML syrup Place 324 mg into feeding tube at bedtime.   Yes [provider]  gabapentin (NEURONTIN) 100 MG capsule Take 100 mg by mouth 3 (three) times daily. 01/18/20  Yes [provider]  heparin 5000 UNIT/ML injection Inject 1 mL (5,000 Units total) into the skin every 12 (twelve) hours. 09/24/19  Yes Nolberto Hanlon, MD  lacosamide 100 MG TABS Place 1 tablet (100 mg total) into feeding tube 2 (two) times daily. Patient taking differently: Take 100 mg by mouth 2 (two) times daily.  09/24/19  Yes Nolberto Hanlon, MD  magnesium hydroxide (MILK OF MAGNESIA) 400 MG/5ML  suspension Take by mouth daily as needed for mild constipation.   Yes [provider]  metoCLOPramide (REGLAN) 5 MG/5ML solution Take 5 mg by mouth every 8 (eight) hours.    Yes [provider]  Multiple Vitamin (MULTI-VITAMIN) tablet Take 1 tablet by mouth daily.    Yes [provider]  traMADol (ULTRAM) 50 MG tablet Take 50 mg by mouth every 4 (four) hours as needed.   Yes [provider]   Allergies  Allergen Reactions   Glipizide Er Other (See Comments)    Hypoglycemia    Percocet [Oxycodone-Acetaminophen] Hives    FAMILY HISTORY:  family history includes Cancer in her father; Diabetes in her mother. SOCIAL HISTORY:  reports that she quit smoking about 17 years ago. Her smoking use included cigarettes. She has never used smokeless tobacco. She reports that she does not drink alcohol and does not use drugs.  REVIEW OF SYSTEMS:   Unable to assess pt lethargic   SUBJECTIVE:  Unable to assess pt lethargic   VITAL SIGNS: Temp:  [97.8 F (36.6 C)-98.3 F (36.8 C)] 98.3 F (36.8 C) (07/05 1515) Pulse Rate:  [44-98] 84 (07/05 2300) Resp:  [10-24] 21 (07/05 2300) BP: (107-240)/(45-129) 120/55 (07/05 2300) SpO2:  [95 %-100 %] 97 % (07/05 2300) Weight:  [63.2 kg] 63.2 kg (07/05 2130)  PHYSICAL EXAMINATION: General: chronically ill appearing female, NAD  Neuro: lethargic, not following commands, PERRL  HEENT: supple, no JVD  Cardiovascular: nsr, rrr, no R/G Lungs: rhonchi throughout, mildly labored  Abdomen: hypoactive BS x4, obese, soft, non distended, LUQ PEG tube dressing dry and intact  Musculoskeletal: normal bulk and tone,  Skin: intact no rashes or lesions present, LUE av fistula +bruit/+thrill  Recent Labs  Lab 01/21/20 1204 01/22/20 1150  NA 141 139  K 2.7* 4.0  CL 113* 104  CO2 21* 24  BUN 19 33*  CREATININE 3.34* 6.20*  GLUCOSE 83 82   Recent Labs  Lab 01/21/20 1109 01/22/20 1150  HGB 11.2* 9.1*  HCT 37.9 31.1*   WBC 11.9* 6.5  PLT 199 197   DG Abd 1 View  Result Date: 01/22/2020 CLINICAL DATA:  Percutaneous gastrostomy tube. EXAM: ABDOMEN - 1 VIEW COMPARISON:  09/28/2019 FINDINGS: Percutaneous gastrostomy tube has tip over the stomach in the left upper quadrant. Bowel gas pattern is nonobstructive. Remainder of the exam is unchanged. IMPRESSION: Nonobstructive bowel gas pattern. Peg tube with tip over the stomach in the left upper quadrant. Electronically Signed   By: Marin Olp M.D.   On: 01/22/2020 17:33   CT Head Wo Contrast  Result Date: 01/21/2020 CLINICAL DATA:  Seizure and abnormal neurologic exam. Three seizures this morning. EXAM: CT HEAD WITHOUT CONTRAST TECHNIQUE: Contiguous axial images were obtained from the base of the skull through the vertex without intravenous contrast. COMPARISON:  05/12/2019 FINDINGS: Brain: Ventricles, cisterns and other CSF spaces are normal. There is moderate chronic ischemic microvascular disease. Minimal age related atrophic change. No mass, mass effect, shift of midline structures or acute hemorrhage. No evidence of acute infarction. Vascular: No hyperdense vessel or unexpected calcification. Skull: Normal. Negative for fracture or focal lesion. Sinuses/Orbits: No acute finding. Other: None. IMPRESSION: 1.  No acute findings. 2. Chronic ischemic microvascular disease and minimal age related atrophic change. Electronically Signed   By: Marin Olp M.D.   On: 01/21/2020 11:36    ASSESSMENT / PLAN:  Seizure disorder  Acute metabolic encephalopathy  Hx: COPD  Supplemental O2 for dyspnea and/or hypoxia  Neurology consulted appreciate input-continue vimpat, keppra, and prn ativan for seizure activity  Pts daughter refusing transfer to [facility] for continuous EEG monitoring at this time: will order EEG in the am  Frequent reorientation  Palliative Care consulted appreciate input-continue goals of care discussions   Hypertension  Hx: HLD Continuous  telemetry monitoring  Prn hydralazine for bp management   ESRD on HD  Hypomagnesia Hypokalemia  Trend BMP  Replace electrolytes as indicated  Monitor UOP  Avoid nephrotoxic medications  Nephrology consulted appreciate input-HD per recommendations   Anemia of CKD VTE px: subq heparin  Trend CBC  Monitor for s/sx of bleeding and transfuse for hgb <7  Type II Diabetes Mellitus  CBG's q4hrs  [facility]  Marda Stalker, [place] Pager [phone] (please enter 7 digits) PCCM Consult Pager [phone] (please enter 7 digits)

## 2020-01-22 NOTE — Progress Notes (Signed)
Followed up with patient at bedside. No external signs of seizure.  Case discussed with ICU elink Dr. Oletta Darter to consult on patient in order to monitor patient overnight in ICU. Pt likely high risk for going into status epilepticus. He will place her on ICU list.   Patient's daughter, Norberto Sorenson, was updated over the phone, discussed that patient does not have anymore external seizure activities but difficult to know if she is having subclinical seizures without continuous EEG which would require a transfer to [facility]. Daughter does not want her to be transferred at this time and would like her to be monitored here at [facility]. She is aware that we cannot make medication adjustments unless she shows external signs of seizures since we do not have continuous EEG.

## 2020-01-22 NOTE — Progress Notes (Signed)
1820 - During rounding- noticed patient having another seizure.   Gave 3rd dose of Ativan. Patient did not come out of it after 10 minutes.  Call Rapid Response and notified MD   1845   Resp. therapist and RR nurse arrived to assess patient.  O2 Sats had dropped to 85% on 2 L.  O2 was turned up to 5L- O2 sat at 100%.  Messaged MD MD ordered 1 g of Keppra and another dose of Ativan.  Night time MD to come see patient.     1910  Night shift hospitalist arrived in room- gave update.  Called and spoke to [place]  Will give another 1 g of Keppra.  Patient's Blood pressure jumped up into the 200s.  MD ordered to give 10 mg hydralazine.   Patient continued to have seizure  MD spoke to teleneurologist-  Will start phenytoin at half ordered dose and transfer to ICU.  Patient continued to be nonresponsive during this episode.      Around 2039- patient's seizure appeared to end.

## 2020-01-23 ENCOUNTER — Ambulatory Visit (INDEPENDENT_AMBULATORY_CARE_PROVIDER_SITE_OTHER): Payer: Medicare Other | Admitting: Nurse Practitioner

## 2020-01-23 ENCOUNTER — Telehealth: Payer: Self-pay | Admitting: *Deleted

## 2020-01-23 ENCOUNTER — Encounter (INDEPENDENT_AMBULATORY_CARE_PROVIDER_SITE_OTHER): Payer: Medicare Other

## 2020-01-23 DIAGNOSIS — G40901 Epilepsy, unspecified, not intractable, with status epilepticus: Secondary | ICD-10-CM

## 2020-01-23 DIAGNOSIS — Z7189 Other specified counseling: Secondary | ICD-10-CM

## 2020-01-23 LAB — BASIC METABOLIC PANEL
Anion gap: 9 (ref 5–15)
BUN: 15 mg/dL (ref 8–23)
CO2: 30 mmol/L (ref 22–32)
Calcium: 8.3 mg/dL — ABNORMAL LOW (ref 8.9–10.3)
Chloride: 98 mmol/L (ref 98–111)
Creatinine, Ser: 3.66 mg/dL — ABNORMAL HIGH (ref 0.44–1.00)
GFR calc Af Amer: 13 mL/min — ABNORMAL LOW (ref >60)
GFR calc non Af Amer: 11 mL/min — ABNORMAL LOW (ref >60)
Glucose, Bld: 93 mg/dL (ref 70–99)
Potassium: 3.9 mmol/L (ref 3.5–5.1)
Sodium: 137 mmol/L (ref 135–145)

## 2020-01-23 LAB — CBC WITH DIFFERENTIAL/PLATELET
Abs Immature Granulocytes: 0.03 10*3/uL (ref 0.00–0.07)
Basophils Absolute: 0 10*3/uL (ref 0.0–0.1)
Basophils Relative: 1 %
Eosinophils Absolute: 0 10*3/uL (ref 0.0–0.5)
Eosinophils Relative: 1 %
HCT: 30 % — ABNORMAL LOW (ref 36.0–46.0)
Hemoglobin: 8.6 g/dL — ABNORMAL LOW (ref 12.0–15.0)
Immature Granulocytes: 0 %
Lymphocytes Relative: 14 %
Lymphs Abs: 1.1 10*3/uL (ref 0.7–4.0)
MCH: 24.9 pg — ABNORMAL LOW (ref 26.0–34.0)
MCHC: 28.7 g/dL — ABNORMAL LOW (ref 30.0–36.0)
MCV: 87 fL (ref 80.0–100.0)
Monocytes Absolute: 0.5 10*3/uL (ref 0.1–1.0)
Monocytes Relative: 7 %
Neutro Abs: 5.8 10*3/uL (ref 1.7–7.7)
Neutrophils Relative %: 77 %
Platelets: 151 10*3/uL (ref 150–400)
RBC: 3.45 MIL/uL — ABNORMAL LOW (ref 3.87–5.11)
RDW: 17.1 % — ABNORMAL HIGH (ref 11.5–15.5)
WBC: 7.4 10*3/uL (ref 4.0–10.5)
nRBC: 0 % (ref 0.0–0.2)

## 2020-01-23 LAB — PHOSPHORUS: Phosphorus: 2.4 mg/dL — ABNORMAL LOW (ref 2.5–4.6)

## 2020-01-23 LAB — GLUCOSE, CAPILLARY: Glucose-Capillary: 81 mg/dL (ref 70–99)

## 2020-01-23 LAB — MAGNESIUM: Magnesium: 1.5 mg/dL — ABNORMAL LOW (ref 1.7–2.4)

## 2020-01-23 MED ORDER — GLYCOPYRROLATE 0.2 MG/ML IJ SOLN: 0.2000 mg | INTRAMUSCULAR | Status: DC | PRN

## 2020-01-23 MED ORDER — HALOPERIDOL LACTATE 5 MG/ML IJ SOLN: 0.5000 mg | INTRAMUSCULAR | Status: DC | PRN

## 2020-01-23 MED ORDER — POLYVINYL ALCOHOL 1.4 % OP SOLN
1.0000 [drp] | Freq: Four times a day (QID) | OPHTHALMIC | Status: DC | PRN
  Filled 2020-01-23: qty 15

## 2020-01-23 MED ORDER — INSULIN ASPART 100 UNIT/ML ~~LOC~~ SOLN: 0.0000 [IU] | SUBCUTANEOUS | Status: DC

## 2020-01-23 MED ORDER — GLYCOPYRROLATE 1 MG PO TABS
1.0000 mg | ORAL_TABLET | ORAL | Status: DC | PRN
  Filled 2020-01-23: qty 1

## 2020-01-23 MED ORDER — BIOTENE DRY MOUTH MT LIQD: 15.0000 mL | OROMUCOSAL | Status: DC | PRN

## 2020-01-23 MED ORDER — HALOPERIDOL LACTATE 2 MG/ML PO CONC
0.5000 mg | ORAL | Status: DC | PRN
  Filled 2020-01-23: qty 0.3

## 2020-01-23 MED ORDER — MAGNESIUM SULFATE 2 GM/50ML IV SOLN
2.0000 g | Freq: Once | INTRAVENOUS | Status: AC
  Administered 2020-01-23: 2 g via INTRAVENOUS
  Filled 2020-01-23: qty 50

## 2020-01-23 MED ORDER — HALOPERIDOL 0.5 MG PO TABS
0.5000 mg | ORAL_TABLET | ORAL | Status: DC | PRN
  Filled 2020-01-23: qty 1

## 2020-01-23 MED ORDER — ONDANSETRON 4 MG PO TBDP
4.0000 mg | ORAL_TABLET | Freq: Four times a day (QID) | ORAL | Status: DC | PRN
  Filled 2020-01-23: qty 1

## 2020-01-23 MED ORDER — ONDANSETRON HCL 4 MG/2ML IJ SOLN: 4.0000 mg | Freq: Four times a day (QID) | INTRAMUSCULAR | Status: DC | PRN

## 2020-01-23 NOTE — Consult Note (Signed)
[facility] for Electrolyte Monitoring and Replacement   Recent Labs: Potassium (mmol/L)  Date Value  01/23/2020 3.9  10/30/2014 3.2 (L)   Magnesium (mg/dL)  Date Value  01/23/2020 1.5 (L)  09/20/2011 1.9   Calcium (mg/dL)  Date Value  01/23/2020 8.3 (L)   Calcium, Total (mg/dL)  Date Value  08/07/2012 9.1   Albumin (g/dL)  Date Value  01/21/2020 1.7 (L)  08/02/2012 3.9   Phosphorus (mg/dL)  Date Value  01/23/2020 2.4 (L)   Sodium (mmol/L)  Date Value  01/23/2020 137  11/04/2016 138  08/07/2012 138   Corrected Ca: 10.14 mg/dL  Assessment: 79 yo female with hx of ESRD on HD, Hyperlipidemia, Type II Diabetes Mellitus, COPD, GERD, Asthma, Bladder Cancer, and Seizure Activity on high dose Vimpat admitted to the telemetry unit 07/4 following 3 seizures required transfer to the stepdown unit on 07/5 following ongoing seizure activity on-and-off that has been refractory to multiple high doses of Keppra  Goal of Therapy:  Potassium 4.0 - 5.1 mmol/L Magnesium 2.0 - 2.4 mg/dL All Other Electrolytes WNL  Plan:   2 grams IV magnesium sulfate x 1  Borderline hypophosphatemia: replacement deferred since this is a HD patient  F/u electrolytes in am  Dallie Piles ,PharmD Clinical Pharmacist 01/23/2020 7:04 AM

## 2020-01-23 NOTE — Progress Notes (Signed)
SLP Cancellation Note  Patient Details Name: Barbara [name] MRN: [id number] DOB: 08-03-1940   Cancelled treatment:       Reason Eval/Treat Not Completed: Patient not medically ready;Medical issues which prohibited therapy (chart reviewed). Per chart notes, pt remains NPO d/t medical status. Also, pt has a Baseline h/o and dx of oropharyngeal phase Dysphagia --- last admit, she was dx'd w/ FTT and a PEG was placed per family. She was discharged taking Both TFs via PEG and [place]asure po's of dysphagia level 1 (puree) diet w/ Nectar liquids. Per report, this seems to have been pt's POC at the NH prior to this admit for Seizure activity.  Recommend return to pt's Baseline POC when able of TFs as primary nutrition/hydration and Pleasure po's of dysphagia diet as appropriate; aspiration precautions. Recommend ST services f/u for assessment of appropriateness of dysphagia diet for Pleasure when appropriate.     [name], MS, CCC-SLP Avalee Castrellon 01/23/2020, 11:03 AM

## 2020-01-23 NOTE — Progress Notes (Signed)
Palliative:   Mrs. Cahoon is resting quietly;y in bed. She does not respond in any meaningful way to voice or touch.  She is noted to have multiple seizure yesterday.   Call to daughter Norberto Sorenson, no answer.  Call to Daughter/healthcare surrogate, Norberto Sorenson later.  We talk about Mrs. Moller declines overnight.  Enda states that she doesn't want Mrs. Kurt to suffer and would like to focus on comfort care. I share that this is a loving way to care for Mrs. Waibel.  Parke Simmers talks about transfering to residential hospice.  She requests comfort and dignity at end of life, Residential Hospice with [facility] in [place].  Hospice team notified.  I reassure Parke Simmers that the medical team will care for her, unburdening her for medication and treatments that are not changing what is happening and are painful.   We talk about the loss the family has experienced over the last few months.  Mrs. Goshorn was placed in [facility] in February of this year.  She began having declines/FTT and had a PEG tube placed.  She has lost 19 lbs in the last 3 months.  Mr. Caniglia passed away last September 16, 2022 and was buried Sunday.  Edna has stage 4 colon cancer.  Edna shares that her mothers wish was to not have to bury her child.    Detailed conference with attending, bedside nursing staff, TOC team and Hospice liaison related to patient condition, needs, comfort measures, request for residential hospice in [place].   Orders Adjusted. Mrs. Capo will continue anti seizure medications until she can transfer to residential hospice, then these medications will stop.   Plan:  FULL COMFORT CARE.  Daughter Edna Harris is requesting comfort and dignity at end of life, residential Hospice with [facility] .      40  minutes  Quinn Axe, NP Palliative Medicine Team  Team phone [phone]  Greater than 50% of this time was spent counseling and coordinating care related to the above assessment and plan.

## 2020-01-23 NOTE — Telephone Encounter (Signed)
Reviewed chart prior to apt tomorrow. Patient is currently in the hospital.

## 2020-01-23 NOTE — Progress Notes (Signed)
Hemodialysis patient known at [facility] MWF 6:00am. Patient is living at [facility], who transports to treatments. Please contact me with any dialysis placement concerns.  [name] Dialysis Coordinator [phone]

## 2020-01-23 NOTE — Progress Notes (Addendum)
[facility] Room ICU [facility] Liaison RN note:   Received request from Barbara Axe, NP and [place], [facility], for family interest in [place]. Chart reviewed and eligibility is confirmed.  Hospice Home has no bed availability at this time. Hospital care team is aware. Liaison will discuss with family when they are available.  Please call with any hospice related questions or concerns.  Thank you for the opportunity to participate in this patients care.  Zandra Abts, RN [facility] Liaison [phone]

## 2020-01-23 NOTE — Progress Notes (Signed)
PROGRESS NOTE    Barbara [name]  TAV:[id number] DOB: 10/02/1940 DOA: 01/21/2020 PCP: Juluis Pitch, MD   Chief complaint: seizure Brief Narrative: Barbara Carlean Jews Conyersis a 79 y.o.femalewith medical history significant ofESRD on dialysis, seizure disorder on Vimpat, CAD, HTN,HLD,COPD, asthma, dCHF , T2DM, GERD, bladder cancerwho presented to the ED via EMS from her facility today due to seizure activity.  She had 3 episodes of seizure in the nursing home.  She is started on Vimpat per neurology recommendation.  7/5.  Patient developed status epilepticus yesterday evening, received loading dose of Keppra and Dilantin.  Also received multiple doses of Ativan.  Patient was transferred to ICU for monitoring.  7/6.  Family had decided on comfort care only.  Initiate comfort care.   Assessment & Plan:   Principal Problem:   Seizure ([place]) Active Problems:   Leukocytosis   Anemia in chronic kidney disease   COPD (chronic obstructive pulmonary disease) (HCC)   CAD in native artery   Benign essential HTN   Protein-calorie malnutrition, severe   ESRD (end stage renal disease) (HCC)   Metabolic acidosis   Acute metabolic encephalopathy   Type II diabetes mellitus with renal manifestations (HCC)   Sacral ulcer (HCC)   Chronic diastolic CHF (congestive heart failure) (HCC)   Pressure injury of skin   Hypokalemia   Hypocalcemia   Abnormal urinalysis   Elevated troponin   Failure to thrive in adult   Seizures ([place])   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  Currently, patient comfortable, continue on Keppra 500 mg every 12 hours to prevent seizure from happening again.  Continue comfort care.     Subjective: Patient currently comfortable without any distress.  No additional seizure since last night.  Objective: Vitals:   01/22/20 2200 01/22/20 2300 01/23/20 0000 01/23/20 0800  BP: (!) 107/48 (!) 120/55 (!) 118/53    Pulse: 72 84 78   Resp: 20 (!) 21 18   Temp:   98.3 F (36.8 C) 98.2 F (36.8 C)  TempSrc:   Axillary Axillary  SpO2: 100% 97% 100%   Weight:      Height:        Intake/Output Summary (Last 24 hours) at 01/23/2020 1127 Last data filed at 01/22/2020 2300 Gross per 24 hour  Intake 414.49 ml  Output 0 ml  Net 414.49 ml   Filed Weights   01/21/20 1055 01/22/20 2130  Weight: 67.3 kg 63.2 kg    Examination:  General exam: Appears  comfortable  Respiratory system: Clear to auscultation. Respiratory effort normal. Cardiovascular system: S1 & S2 heard, RRR. No JVD, murmurs, rubs, gallops or clicks. No pedal edema. Gastrointestinal system: Abdomen is nondistended, soft and nontender. Central nervous system: Drawsy and confused. Extremities: Symmetric 5  Skin: No rashes, lesions or ulcers     Data Reviewed: I have personally reviewed following labs and imaging studies  CBC: Recent Labs  Lab 01/21/20 1109 01/22/20 1150 01/23/20 0601  WBC 11.9* 6.5 7.4  NEUTROABS  --   --  5.8  HGB 11.2* 9.1* 8.6*  HCT 37.9 31.1* 30.0*  MCV 85.9 85.2 87.0  PLT 199 197 553   Basic Metabolic Panel: Recent Labs  Lab 01/21/20 1204 01/21/20 1809 01/22/20 1150 01/23/20 0601  NA 141  --  139 137  K 2.7*  --  4.0 3.9  CL 113*  --  104 98  CO2 21*  --  24 30  GLUCOSE 83  --  82 93  BUN 19  --  33* 15  CREATININE 3.34*  --  6.20* 3.66*  CALCIUM 6.0*  --  8.5* 8.3*  MG  --   --  1.6* 1.5*  PHOS  --  1.2* 2.5 2.4*   GFR: Estimated Creatinine Clearance: 10.6 mL/min (A) (by C-G formula based on SCr of 3.66 mg/dL (H)). Liver Function Tests: Recent Labs  Lab 01/21/20 1204  AST 9*  ALT <5  ALKPHOS 54  BILITOT 0.4  PROT 5.4*  ALBUMIN 1.7*   Recent Labs  Lab 01/21/20 1204  LIPASE 15   No results for input(s): AMMONIA in the last 168 hours. Coagulation Profile: No results for input(s): INR, PROTIME in the last 168 hours. Cardiac Enzymes: No results for input(s): CKTOTAL, CKMB,  CKMBINDEX, TROPONINI in the last 168 hours. BNP (last 3 results) No results for input(s): PROBNP in the last 8760 hours. HbA1C: Recent Labs    01/21/20 1409  HGBA1C 5.0   CBG: Recent Labs  Lab 01/22/20 1607 01/22/20 1612 01/22/20 1638 01/22/20 2125 01/23/20 0733  GLUCAP 60* 53* 140* 81 81   Lipid Profile: No results for input(s): CHOL, HDL, LDLCALC, TRIG, CHOLHDL, LDLDIRECT in the last 72 hours. Thyroid Function Tests: No results for input(s): TSH, T4TOTAL, FREET4, T3FREE, THYROIDAB in the last 72 hours. Anemia Panel: No results for input(s): VITAMINB12, FOLATE, FERRITIN, TIBC, IRON, RETICCTPCT in the last 72 hours. Sepsis Labs: No results for input(s): PROCALCITON, LATICACIDVEN in the last 168 hours.  Recent Results (from the past 240 hour(s))  Urine Culture     Status: None   Collection Time: 01/21/20  1:10 PM   Specimen: Urine, Random  Result Value Ref Range Status   Specimen Description   Final    URINE, RANDOM Performed at [facility], [address], [place], [place] [address]    Special Requests   Final    NONE Performed at [facility], [address] [place], [place] [address]    Culture   Final    NO GROWTH Performed at [facility], [place] [address], [place], [place] [address]    Report Status 01/22/2020 FINAL  Final  MRSA PCR Screening     Status: Abnormal   Collection Time: 01/21/20  7:18 PM   Specimen: Nasopharyngeal  Result Value Ref Range Status   MRSA by PCR POSITIVE (A) NEGATIVE Final    Comment:        The GeneXpert MRSA Assay (FDA approved for NASAL specimens only), is one component of a comprehensive MRSA colonization surveillance program. It is not intended to diagnose MRSA infection nor to guide or monitor treatment for MRSA infections. RESULT CALLED TO, READ BACK BY AND VERIFIED WITH: Lear Ng @0011  01/22/20 MJU Performed at [facility], [address], [place],  [address]           Radiology Studies: DG Abd 1 View  Result Date: 01/22/2020 CLINICAL DATA:  Percutaneous gastrostomy tube. EXAM: ABDOMEN - 1 VIEW COMPARISON:  09/28/2019 FINDINGS: Percutaneous gastrostomy tube has tip over the stomach in the left upper quadrant. Bowel gas pattern is nonobstructive. Remainder of the exam is unchanged. IMPRESSION: Nonobstructive bowel gas pattern. Peg tube with tip over the stomach in the left upper quadrant. Electronically Signed   By: Marin Olp M.D.   On: 01/22/2020 17:33        Scheduled Meds: Continuous Infusions: . lacosamide (VIMPAT) IV 150 mg (01/23/20 0910)  . levETIRAcetam 500 mg (  01/23/20 1102)     LOS: 2 days    Time spent: 28 minutes    Sharen Hones, MD [facility]   To contact the attending provider between 7A-7P or the covering provider during after hours 7P-7A, please log into the web site [email or web address] and access using universal [place] password for that web site. If you do not have the password, please call the hospital operator.  01/23/2020, 11:27 AM

## 2020-01-23 NOTE — Progress Notes (Signed)
Nutrition Brief Follow-Up Note  Chart reviewed and discussed patient on rounds. Patient now transitioning to comfort care.   No further nutrition interventions warranted at this time. Please re-consult RD as needed.   Jacklynn Barnacle, MS, RD, LDN Pager number available on Amion

## 2020-01-23 NOTE — Progress Notes (Signed)
[facility], [place] 01/23/20  Subjective:   LOS: 2 [facility] after [facility] had 3 seizures there.  She was altered mental status when she arrived.  Was not able to provide much information.  She was treated medically and with IV antiepileptics.  She was also found to have electrolyte abnormalities including low potassium and phosphorus. Patient was noted to have seizures again yesterday postdialysis    Objective:  Vital signs in last 24 hours:  Temp:  [98.2 F (36.8 C)-98.3 F (36.8 C)] 98.2 F (36.8 C) (07/06 0800) Pulse Rate:  [44-98] 78 (07/06 0000) Resp:  [10-24] 18 (07/06 0000) BP: (107-240)/(48-129) 118/53 (07/06 0000) SpO2:  [95 %-100 %] 100 % (07/06 0000) Weight:  [63.2 kg] 63.2 kg (07/05 2130)  Weight change: -4.1 kg Filed Weights   01/21/20 1055 01/22/20 2130  Weight: 67.3 kg 63.2 kg    Intake/Output:    Intake/Output Summary (Last 24 hours) at 01/23/2020 1337 Last data filed at 01/22/2020 2300 Gross per 24 hour  Intake 414.49 ml  Output 0 ml  Net 414.49 ml     Physical Exam: General:  Frail, elderly woman, laying in the bed  HEENT  moist oral mucous membranes  Pulm/lungs  normal breathing effort, no crackles  CVS/Heart  no rub  Abdomen:   Soft, nontender, nondistended  Extremities:  Trace edema  Neurologic:  Very somnolent.  Did not respond to verbal stimuli  Skin:  No acute rashes  Access:  Left arm AV graft       Basic Metabolic Panel:  Recent Labs  Lab 01/21/20 1204 01/21/20 1809 01/22/20 1150 01/23/20 0601  NA 141  --  139 137  K 2.7*  --  4.0 3.9  CL 113*  --  104 98  CO2 21*  --  24 30  GLUCOSE 83  --  82 93  BUN 19  --  33* 15  CREATININE 3.34*  --  6.20* 3.66*  CALCIUM 6.0*  --  8.5* 8.3*  MG  --   --  1.6* 1.5*  PHOS  --  1.2* 2.5 2.4*     CBC: Recent Labs  Lab 01/21/20 1109 01/22/20 1150 01/23/20 0601  WBC 11.9* 6.5 7.4  NEUTROABS  --   --  5.8  HGB 11.2* 9.1* 8.6*   HCT 37.9 31.1* 30.0*  MCV 85.9 85.2 87.0  PLT 199 197 151      Lab Results  Component Value Date   HEPBSAG NON REACTIVE 10/09/2019      Microbiology:  Recent Results (from the past 240 hour(s))  Urine Culture     Status: None   Collection Time: 01/21/20  1:10 PM   Specimen: Urine, Random  Result Value Ref Range Status   Specimen Description   Final    URINE, RANDOM Performed at [facility], [address], [place], [place] [address]    Special Requests   Final    NONE Performed at [facility], [address] [place], [place] [address]    Culture   Final    NO GROWTH Performed at [facility], [place] [address], [place], [place] [address]    Report Status 01/22/2020 FINAL  Final  MRSA PCR Screening     Status: Abnormal   Collection Time: 01/21/20  7:18 PM   Specimen: Nasopharyngeal  Result Value Ref Range Status   MRSA by PCR POSITIVE (A) NEGATIVE Final    Comment:  The GeneXpert MRSA Assay (FDA approved for NASAL specimens only), is one component of a comprehensive MRSA colonization surveillance program. It is not intended to diagnose MRSA infection nor to guide or monitor treatment for MRSA infections. RESULT CALLED TO, READ BACK BY AND VERIFIED WITH: KIM BOYCE @0011  01/22/20 MJU Performed at [facility], [address] [place], [place] [address]     Coagulation Studies: No results for input(s): LABPROT, INR in the last 72 hours.  Urinalysis: Recent Labs    01/21/20 1204  COLORURINE YELLOW*  LABSPEC 1.005  PHURINE 9.0*  GLUCOSEU 50*  HGBUR MODERATE*  BILIRUBINUR NEGATIVE  KETONESUR NEGATIVE  PROTEINUR 100*  NITRITE NEGATIVE  LEUKOCYTESUR LARGE*      Imaging: DG Abd 1 View  Result Date: 01/22/2020 CLINICAL DATA:  Percutaneous gastrostomy tube. EXAM: ABDOMEN - 1 VIEW COMPARISON:  09/28/2019 FINDINGS: Percutaneous gastrostomy tube has tip over the stomach in the left upper quadrant. Bowel gas pattern  is nonobstructive. Remainder of the exam is unchanged. IMPRESSION: Nonobstructive bowel gas pattern. Peg tube with tip over the stomach in the left upper quadrant. Electronically Signed   By: Marin Olp M.D.   On: 01/22/2020 17:33     Medications:   . lacosamide (VIMPAT) IV 150 mg (01/23/20 0910)  . levETIRAcetam 500 mg (01/23/20 1102)    acetaminophen **OR** acetaminophen, antiseptic oral rinse, glycopyrrolate **OR** glycopyrrolate **OR** glycopyrrolate, haloperidol **OR** haloperidol **OR** haloperidol lactate, hydrALAZINE, LORazepam, ondansetron **OR** ondansetron (ZOFRAN) IV, polyvinyl alcohol  Assessment/ Plan:  79 y.o. female with  was admitted on 01/21/2020 for  Principal Problem:   Seizure [facility]) Active Problems:   Leukocytosis   Anemia in chronic kidney disease   COPD (chronic obstructive pulmonary disease) (HCC)   CAD in native artery   Benign essential HTN   Protein-calorie malnutrition, severe   ESRD (end stage renal disease) ([place])   Metabolic acidosis   Acute metabolic encephalopathy   Type II diabetes mellitus with renal manifestations (HCC)   Sacral ulcer (HCC)   Chronic diastolic CHF (congestive heart failure) (HCC)   Pressure injury of skin   Hypokalemia   Hypocalcemia   Abnormal urinalysis   Elevated troponin   Failure to thrive in adult   Seizures ([place])   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 ESRD with hypokalemia Family has decided upon comfort care No further dialysis will be scheduled.  #. Anemia of CKD  Lab Results  Component Value Date   HGB 8.6 (L) 01/23/2020   May discontinue Neupogen  #. Secondary hyperparathyroidism of  renal origin N 25.81      Component Value Date/Time   PTH 310 (H) 09/06/2019 0850   Lab Results  Component Value Date   PHOS 2.4 (L) 01/23/2020    #Seizure disorder Had recurrent seizures last night postdialysis Treated with multiple medications including Vimpat, Ativan, Dilantin Poorly responsive today Comfort care   LOS: [place] 7/6/20211:37 PM  [place], [place]

## 2020-01-23 NOTE — Progress Notes (Signed)
Subjective: Patient with frequent seizures overnight.  Lorazepam administered and Dilantin started.  Patient poorly responsive this morning.  Family has decided upon comfort care.    Objective: Current vital signs: BP (!) 118/53   Pulse 78   Temp 98.2 F (36.8 C) (Axillary)   Resp 18   Ht 5' 4"  (1.626 m)   Wt 63.2 kg   SpO2 100%   BMI 23.92 kg/m  Vital signs in last 24 hours: Temp:  [98.2 F (36.8 C)-98.3 F (36.8 C)] 98.2 F (36.8 C) (07/06 0800) Pulse Rate:  [44-98] 78 (07/06 0000) Resp:  [10-24] 18 (07/06 0000) BP: (107-240)/(45-129) 118/53 (07/06 0000) SpO2:  [95 %-100 %] 100 % (07/06 0000) Weight:  [63.2 kg] 63.2 kg (07/05 2130)  Intake/Output from previous day: 07/05 0701 - 07/06 0700 In: 598.1 [I.V.:183.7; IV Piggyback:414.5] Out: 0  Intake/Output this shift: No intake/output data recorded. Nutritional status:  Diet Order            Diet NPO time specified Except for: Other (See Comments), Ice Chips  Diet effective now                 Neurologic Exam: Mental Status: Patient does not respond to verbal stimuli.  Opens eyes with deep sternal rub.  Does not follow commands.  No verbalizations noted.  Cranial Nerves: II: patient does not respond confrontation bilaterally, pupils reactive bilaterally III,IV,VI: Oculocephalic response present bilaterally. Esotropia of the right eye V,VII: corneal reflex present bilaterally  VIII: patient does not respond to verbal stimuli IX,X: gag reflex reduced, XI: trapezius strength unable to test bilaterally XII: tongue strength unable to test Motor: Extremities flaccid throughout.  No spontaneous movement noted.  No purposeful movements noted. Sensory: Does not respond to noxious stimuli in any extremity.   Lab Results: Basic Metabolic Panel: Recent Labs  Lab 01/21/20 1204 01/21/20 1809 01/22/20 1150 01/23/20 0601  NA 141  --  139 137  K 2.7*  --  4.0 3.9  CL 113*  --  104 98  CO2 21*  --  24 30  GLUCOSE 83   --  82 93  BUN 19  --  33* 15  CREATININE 3.34*  --  6.20* 3.66*  CALCIUM 6.0*  --  8.5* 8.3*  MG  --   --  1.6* 1.5*  PHOS  --  1.2* 2.5 2.4*    Liver Function Tests: Recent Labs  Lab 01/21/20 1204  AST 9*  ALT <5  ALKPHOS 54  BILITOT 0.4  PROT 5.4*  ALBUMIN 1.7*   Recent Labs  Lab 01/21/20 1204  LIPASE 15   No results for input(s): AMMONIA in the last 168 hours.  CBC: Recent Labs  Lab 01/21/20 1109 01/22/20 1150 01/23/20 0601  WBC 11.9* 6.5 7.4  NEUTROABS  --   --  5.8  HGB 11.2* 9.1* 8.6*  HCT 37.9 31.1* 30.0*  MCV 85.9 85.2 87.0  PLT 199 197 151    Cardiac Enzymes: No results for input(s): CKTOTAL, CKMB, CKMBINDEX, TROPONINI in the last 168 hours.  Lipid Panel: No results for input(s): CHOL, TRIG, HDL, CHOLHDL, VLDL, LDLCALC in the last 168 hours.  CBG: Recent Labs  Lab 01/22/20 1607 01/22/20 1612 01/22/20 1638 01/22/20 2125 01/23/20 0733  GLUCAP 60* 53* 140* 81 81    Microbiology: Results for orders placed or performed during the hospital encounter of 01/21/20  Urine Culture     Status: None   Collection Time: 01/21/20  1:10 PM  Specimen: Urine, Random  Result Value Ref Range Status   Specimen Description   Final    URINE, RANDOM Performed at [facility], [address], [place], [place] [address]    Special Requests   Final    NONE Performed at [facility], [address], [place], [place] [address]    Culture   Final    NO GROWTH Performed at [facility], [place] [address], [place], [place] [address]    Report Status 01/22/2020 FINAL  Final  MRSA PCR Screening     Status: Abnormal   Collection Time: 01/21/20  7:18 PM   Specimen: Nasopharyngeal  Result Value Ref Range Status   MRSA by PCR POSITIVE (A) NEGATIVE Final    Comment:        The GeneXpert MRSA Assay (FDA approved for NASAL specimens only), is one component of a comprehensive MRSA colonization surveillance program. It is not intended to  diagnose MRSA infection nor to guide or monitor treatment for MRSA infections. RESULT CALLED TO, READ BACK BY AND VERIFIED WITH: KIM BOYCE @0011  01/22/20 MJU Performed at [facility], [address] [place], [place] [address]     Coagulation Studies: No results for input(s): LABPROT, INR in the last 72 hours.  Imaging: DG Abd 1 View  Result Date: 01/22/2020 CLINICAL DATA:  Percutaneous gastrostomy tube. EXAM: ABDOMEN - 1 VIEW COMPARISON:  09/28/2019 FINDINGS: Percutaneous gastrostomy tube has tip over the stomach in the left upper quadrant. Bowel gas pattern is nonobstructive. Remainder of the exam is unchanged. IMPRESSION: Nonobstructive bowel gas pattern. Peg tube with tip over the stomach in the left upper quadrant. Electronically Signed   By: Marin Olp M.D.   On: 01/22/2020 17:33    Medications: I have reviewed the patient's current medications. Scheduled:   Assessment/Plan: 79 y.o.femalewho is unable to provide any history. History from facility is minimal. Patient with a history of multiple medical problems including ESRD on HD MWF, hyperlipidemia, type 2 diabetes, COPD, asthma, GERD, bladder cancer,multiple episodes of altered mental statusandseizure disorder who presents from her facility after being noted to have 3 episodes of seizure today. Description not available. Patient on Vimpat. Last dose was last evening. Received Ativan at the facility. Now poorly responsive. Head CT personally reviewed and shows no acute changes. Patient has been seen by neurology multiple times over the past year for altered mental status, all of which have been prolonged. Has had multiple work ups for seizure as well, all of which have been unremarkable. Per notes since last discharge patient has continued to go downhill, has been declining in alertness and function but did seem to be eating more with assistance. Continued with episodes of hypoactive delirium.  Yesterday  improved on increased dose of Vimpat but overnight had multiple seizures.  Dilantin added.  Patient poorly responsive today.  Family has decided upon comfort care.  .    No further neurologic intervention is recommended at this time.  If further questions arise, please call or page at that time.  Thank you for allowing neurology to participate in the care of this patient.    LOS: 2 days   Alexis Goodell, MD Neurology [phone] 01/23/2020  12:35 PM

## 2020-01-24 ENCOUNTER — Inpatient Hospital Stay: Payer: Medicare Other

## 2020-01-24 ENCOUNTER — Inpatient Hospital Stay: Payer: Medicare Other | Admitting: Internal Medicine

## 2020-01-24 LAB — PHENYTOIN LEVEL, FREE AND TOTAL
Phenytoin, Free: 4 ug/mL — ABNORMAL HIGH (ref 1.0–2.0)
Phenytoin, Total: 19.2 ug/mL (ref 10.0–20.0)

## 2020-01-24 LAB — LACOSAMIDE: Lacosamide: 6.6 ug/mL (ref 5.0–10.0)

## 2020-01-24 NOTE — Progress Notes (Addendum)
[facility] Room ICU [facility] Liaison RN note  Spoke with daughter, Norberto Sorenson today, to acknowledge referral and initiate education related to hospice philosophy, services and to answer any questions. Ms. Kenton Kingfisher verbalized understanding and had no questions.  Hospice Home is not able to offer a bed today. Ms. Kenton Kingfisher and hospital care team is aware.  [facility] Liaison will follow for room availability. Ms. Kenton Kingfisher requests that her daughter, Geralynn Ochs, be called when consents need to be signed. Ph # [phone]  Please call with any hospice related questions or concerns.  Thank you for the opportunity to participate in this patient's care.  Zandra Abts, RN [facility] Liaison [phone]

## 2020-01-24 NOTE — Progress Notes (Signed)
PROGRESS NOTE    Barbara [name]    Code Status: DNR  TJQ:[id number] DOB: Mar 23, 1941 DOA: 01/21/2020 LOS: 3 days  PCP: Juluis Pitch, MD CC:  Chief Complaint  Patient presents with  . Seizures       Hospital Summary   This is an 79 year old female with a history of ESRD on HD, seizure disorder, CAD, HTN, HLD, COPD, asthma, HFpEF, T2DM, GERD, bladder cancer who presented to the ED via EMS from her facility due to seizure like activity and started on vimpat per neurology.   7/5.  Patient developed status epilepticus yesterday evening, received loading dose of Keppra and Dilantin.  Also received multiple doses of Ativan.  Patient was transferred to ICU for monitoring.  7/6.  Family had decided on comfort care only.  Initiate comfort care.  Currently awaiting hospice facility   A & P   Principal Problem:   Seizure ([place]) Active Problems:   Leukocytosis   Anemia in chronic kidney disease   COPD (chronic obstructive pulmonary disease) (HCC)   CAD in native artery   Benign essential HTN   Protein-calorie malnutrition, severe   ESRD (end stage renal disease) (HCC)   Metabolic acidosis   Acute metabolic encephalopathy   Type II diabetes mellitus with renal manifestations (HCC)   Sacral ulcer (HCC)   Chronic diastolic CHF (congestive heart failure) (HCC)   Pressure injury of skin   Hypokalemia   Hypocalcemia   Abnormal urinalysis   Elevated troponin   Failure to thrive in adult   Seizures ([place])   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 Status epilepticus 2. Type 2 Diabetes 3. HFpEF  Comfort measures  Family Communication: No family at bedside  Disposition Plan: awaiting discharge to hospice Status is: Inpatient  Remains inpatient appropriate because:Unsafe d/c plan   Dispo: The patient is from: SNF              Anticipated d/c is to: [facility]              Anticipated d/c date  is: 1 day              Patient currently is medically stable to d/c.          Pressure injury documentation   Pressure Injury 01/21/20 Sacrum Lower Stage 1 -  Intact skin with non-blanchable redness of a localized area usually over a bony prominence. (Active)  01/21/20 1746  Location: Sacrum  Location Orientation: Lower  Staging: Stage 1 -  Intact skin with non-blanchable redness of a localized area usually over a bony prominence.  Wound Description (Comments):   Present on Admission: Yes    Consultants  Neurology  Procedures  None  Antibiotics   Anti-infectives (From admission, onward)   None        Subjective   Sleeping comfortably and is not arousable to verbal stimuli.   Objective   Vitals:   01/24/20 0100 01/24/20 0500 01/24/20 0600 01/24/20 0752  BP:    132/87  Pulse: 65 62 (!) 59   Resp: 17 20 18    Temp:    (!) 97.2 F (36.2 C)  TempSrc:    Axillary  SpO2: 100% 100% 100%   Weight:      Height:        Intake/Output Summary (Last 24 hours) at 01/24/2020 1427 Last data filed at 01/24/2020 0600 Gross per 24 hour  Intake 1189.86 ml  Output --  Net 1189.86 ml   Filed Weights   01/21/20 1055 01/22/20 2130  Weight: 67.3 kg 63.2 kg    Examination:  Physical Exam Vitals and nursing note reviewed.  Constitutional:      Comments: sleeping  HENT:     Head: Normocephalic and atraumatic.  Eyes:     Conjunctiva/sclera: Conjunctivae normal.  Cardiovascular:     Rate and Rhythm: Normal rate and regular rhythm.  Pulmonary:     Effort: Pulmonary effort is normal.     Breath sounds: Normal breath sounds.  Abdominal:     General: Abdomen is flat.     Palpations: Abdomen is soft.  Musculoskeletal:     Right lower leg: No edema.     Left lower leg: No edema.  Skin:    Coloration: Skin is not jaundiced or pale.     Data Reviewed: I have personally reviewed following labs and imaging studies  CBC: Recent Labs  Lab 01/21/20 1109  01/22/20 1150 01/23/20 0601  WBC 11.9* 6.5 7.4  NEUTROABS  --   --  5.8  HGB 11.2* 9.1* 8.6*  HCT 37.9 31.1* 30.0*  MCV 85.9 85.2 87.0  PLT 199 197 161   Basic Metabolic Panel: Recent Labs  Lab 01/21/20 1204 01/21/20 1809 01/22/20 1150 01/23/20 0601  NA 141  --  139 137  K 2.7*  --  4.0 3.9  CL 113*  --  104 98  CO2 21*  --  24 30  GLUCOSE 83  --  82 93  BUN 19  --  33* 15  CREATININE 3.34*  --  6.20* 3.66*  CALCIUM 6.0*  --  8.5* 8.3*  MG  --   --  1.6* 1.5*  PHOS  --  1.2* 2.5 2.4*   GFR: Estimated Creatinine Clearance: 10.6 mL/min (A) (by C-G formula based on SCr of 3.66 mg/dL (H)). Liver Function Tests: Recent Labs  Lab 01/21/20 1204  AST 9*  ALT <5  ALKPHOS 54  BILITOT 0.4  PROT 5.4*  ALBUMIN 1.7*   Recent Labs  Lab 01/21/20 1204  LIPASE 15   No results for input(s): AMMONIA in the last 168 hours. Coagulation Profile: No results for input(s): INR, PROTIME in the last 168 hours. Cardiac Enzymes: No results for input(s): CKTOTAL, CKMB, CKMBINDEX, TROPONINI in the last 168 hours. BNP (last 3 results) No results for input(s): PROBNP in the last 8760 hours. HbA1C: No results for input(s): HGBA1C in the last 72 hours. CBG: Recent Labs  Lab 01/22/20 1607 01/22/20 1612 01/22/20 1638 01/22/20 2125 01/23/20 0733  GLUCAP 60* 53* 140* 81 81   Lipid Profile: No results for input(s): CHOL, HDL, LDLCALC, TRIG, CHOLHDL, LDLDIRECT in the last 72 hours. Thyroid Function Tests: No results for input(s): TSH, T4TOTAL, FREET4, T3FREE, THYROIDAB in the last 72 hours. Anemia Panel: No results for input(s): VITAMINB12, FOLATE, FERRITIN, TIBC, IRON, RETICCTPCT in the last 72 hours. Sepsis Labs: No results for input(s): PROCALCITON, LATICACIDVEN in the last 168 hours.  Recent Results (from the past 240 hour(s))  Urine Culture     Status: None   Collection Time: 01/21/20  1:10 PM   Specimen: Urine, Random  Result Value Ref Range Status   Specimen Description    Final    URINE, RANDOM Performed at [facility], [address] [place], [place] [address]    Special Requests   Final    NONE Performed at [facility], [phone]  [address], [place], [place] [address]    Culture   Final    NO GROWTH Performed at [facility], [place] [address] [place], [place] [address]    Report Status 01/22/2020 FINAL  Final  MRSA PCR Screening     Status: Abnormal   Collection Time: 01/21/20  7:18 PM   Specimen: Nasopharyngeal  Result Value Ref Range Status   MRSA by PCR POSITIVE (A) NEGATIVE Final    Comment:        The GeneXpert MRSA Assay (FDA approved for NASAL specimens only), is one component of a comprehensive MRSA colonization surveillance program. It is not intended to diagnose MRSA infection nor to guide or monitor treatment for MRSA infections. RESULT CALLED TO, READ BACK BY AND VERIFIED WITH: Lear Ng @0011  01/22/20 MJU Performed at [facility], [address], [place], [place] [address]          Radiology Studies: DG Abd 1 View  Result Date: 01/22/2020 CLINICAL DATA:  Percutaneous gastrostomy tube. EXAM: ABDOMEN - 1 VIEW COMPARISON:  09/28/2019 FINDINGS: Percutaneous gastrostomy tube has tip over the stomach in the left upper quadrant. Bowel gas pattern is nonobstructive. Remainder of the exam is unchanged. IMPRESSION: Nonobstructive bowel gas pattern. Peg tube with tip over the stomach in the left upper quadrant. Electronically Signed   By: Marin Olp M.D.   On: 01/22/2020 17:33        Scheduled Meds: Continuous Infusions: . lacosamide (VIMPAT) IV 150 mg (01/24/20 1011)  . levETIRAcetam 500 mg (01/24/20 1110)     Time spent: 20 minutes with over 50% of the time coordinating the patient's care    Harold Hedge, DO [facility] Pager [phone]  Call night coverage person covering after 7pm

## 2020-01-24 NOTE — Progress Notes (Signed)
Palliative: Mrs. Bassette is resting quietly in bed.  She is now full comfort care.  She does not respond to me in any meaningful way.  She is unable to make her basic needs known, there is no family at bedside at this time.  Conference with attending, bedside nursing staff, transition of care team related to patient condition, needs, goals of care.  Plan: Full comfort care, requesting residential hospice bed with [facility] services in [place]. Prognosis: Days  15 minutes Quinn Axe, NP Palliative medicine team Team phone [phone] Greater than 50% of this time was spent counseling and coordinating care related to the above assessment and plan.

## 2020-01-24 NOTE — Progress Notes (Signed)
No initial orders for pain control for this pt. Informed NP Ouma, advised will consider alternatives if pt needs one.

## 2020-01-24 NOTE — Progress Notes (Signed)
[facility] visited pt. while rounding on ICU; pt. in bed asleep with dtr. and another family member at bedside.  RN said pt.'s husband died very recently and was buried just yesterday.  Dtr. seemed somewhat subdued and occupied; mentioned pt. is waiting for bed at residential hospice.  [facility] offered supportive presence and made family aware of availability if needed.    01/24/20 1500  Clinical Encounter Type  Visited With Patient and family together  Visit Type Initial;Psychological support;Social support;Critical Care  Referral From Nurse;Other (Comment) (Routine Rounding)  Spiritual Encounters  Spiritual Needs Emotional  Stress Factors  Family Stress Factors Major life changes;Health changes;Loss

## 2020-01-25 MED ORDER — FENTANYL 75 MCG/HR TD PT72: 1.0000 | MEDICATED_PATCH | TRANSDERMAL | Status: DC

## 2020-01-25 MED ORDER — MORPHINE SULFATE (CONCENTRATE) 10 MG/0.5ML PO SOLN: 2.5000 mg | ORAL | Status: AC | PRN

## 2020-01-25 MED ORDER — FENTANYL 75 MCG/HR TD PT72
1.0000 | MEDICATED_PATCH | TRANSDERMAL | Status: DC
  Administered 2020-01-25: 1 via TRANSDERMAL
  Filled 2020-01-25: qty 1

## 2020-01-25 MED ORDER — LEVETIRACETAM IN NACL 500 MG/100ML IV SOLN: 500.0000 mg | Freq: Two times a day (BID) | INTRAVENOUS | Status: AC

## 2020-01-25 MED ORDER — MORPHINE SULFATE (CONCENTRATE) 10 MG/0.5ML PO SOLN
2.5000 mg | ORAL | Status: DC | PRN
  Administered 2020-01-25: 17:00:00 5 mg via SUBLINGUAL
  Filled 2020-01-25: qty 0.5

## 2020-01-25 NOTE — Plan of Care (Signed)

## 2020-01-25 NOTE — Discharge Summary (Signed)
Physician Discharge Summary  Barbara [name] WKM:[id number] DOB: [birth year]   PCP: Juluis Pitch, MD  Admit date: 01/21/2020 Discharge date: 01/25/2020 Length of Stay: 4 days   Code Status: DNR  Admitted From: SNF Discharged to:  Residential Hospice Discharge Condition: Poor prognosis  Recommendations for Outpatient Follow-up   1. Continue Keppra IV if able, can switch to solution if she is tolerating 2. Comfort measures per hospice   Hospital Summary  This is an 79 year old female with a history of ESRD on HD, seizure disorder, CAD, HTN, HLD, COPD, asthma, HFpEF, T2DM, GERD, bladder cancer who presented to the ED via EMS from her facility due to seizure like activity and started on vimpat per neurology.   7/5.Patient developed status epilepticus yesterday evening, received loading dose of Keppra and Dilantin. Also received multiple doses of Ativan. Patient was transferred to ICU for monitoring.  7/6. Family had decidedon comfort care only. Initiate comfort care.  7/8: Discharged to residential hospice  A & P   Principal Problem:   Seizure ([place]) Active Problems:   Leukocytosis   Anemia in chronic kidney disease   COPD (chronic obstructive pulmonary disease) (HCC)   CAD in native artery   Benign essential HTN   Protein-calorie malnutrition, severe   ESRD (end stage renal disease) ([place])   Metabolic acidosis   Acute metabolic encephalopathy   Type II diabetes mellitus with renal manifestations (HCC)   Sacral ulcer (HCC)   Chronic diastolic CHF (congestive heart failure) (HCC)   Pressure injury of skin   Hypokalemia   Hypocalcemia   Abnormal urinalysis   Elevated troponin   Failure to thrive in adult   Seizures ([place])   Complex partial status epilepticus ([place])   Status epilepticus ([place])   Encounter for hospice care discussion    1. Status epilepticus 2. Type 2 Diabetes 3. HFpEF  Comfort measures    Consultants  . neurology  Procedures   . none  Antibiotics   Anti-infectives (From admission, onward)   None       Subjective   Patient sleeping comfortably, does not awaken to obtain history.    Objective   Discharge Exam: Vitals:   01/25/20 0011 01/25/20 0819  BP: (!) 196/88 (!) 154/61  Pulse: 93 77  Resp: (!) 21 20  Temp: 97.6 F (36.4 C) 98.2 F (36.8 C)  SpO2: 100% 100%   Vitals:   01/24/20 1900 01/24/20 1930 01/25/20 0011 01/25/20 0819  BP:  (!) 161/67 (!) 196/88 (!) 154/61  Pulse: 84  93 77  Resp: (!) 23  (!) 21 20  Temp: (!) 97.2 F (36.2 C)  97.6 F (36.4 C) 98.2 F (36.8 C)  TempSrc: Axillary  Axillary   SpO2: 100%  100% 100%  Weight:      Height:        Physical Exam Vitals and nursing note reviewed.  Constitutional:      Comments: Chronically ill appearing Sleeping comfortably and does not awaken  HENT:     Mouth/Throat:     Mouth: Mucous membranes are dry.  Cardiovascular:     Rate and Rhythm: Normal rate and regular rhythm.  Pulmonary:     Effort: No respiratory distress.     Breath sounds: No wheezing.  Musculoskeletal:        General: No swelling or tenderness.  Skin:    Coloration: Skin is not jaundiced or pale.       The results of significant diagnostics from this hospitalization (  including imaging, microbiology, ancillary and laboratory) are listed below for reference.     Microbiology: Recent Results (from the past 240 hour(s))  Urine Culture     Status: None   Collection Time: 01/21/20  1:10 PM   Specimen: Urine, Random  Result Value Ref Range Status   Specimen Description   Final    URINE, RANDOM Performed at [facility], [address], [place], [place] [address]    Special Requests   Final    NONE Performed at [facility], [address] [place], [place] [address]    Culture   Final    NO GROWTH Performed at [facility], [place] [address] [place], [place] [address]    Report Status 01/22/2020 FINAL  Final  MRSA PCR  Screening     Status: Abnormal   Collection Time: 01/21/20  7:18 PM   Specimen: Nasopharyngeal  Result Value Ref Range Status   MRSA by PCR POSITIVE (A) NEGATIVE Final    Comment:        The GeneXpert MRSA Assay (FDA approved for NASAL specimens only), is one component of a comprehensive MRSA colonization surveillance program. It is not intended to diagnose MRSA infection nor to guide or monitor treatment for MRSA infections. RESULT CALLED TO, READ BACK BY AND VERIFIED WITH: KIM BOYCE @0011  01/22/20 MJU Performed at [facility], [address] [place], [place] [address]      Labs: BNP (last 3 results) No results for input(s): BNP in the last 8760 hours. Basic Metabolic Panel: Recent Labs  Lab 01/21/20 1204 01/21/20 1809 01/22/20 1150 01/23/20 0601  NA 141  --  139 137  K 2.7*  --  4.0 3.9  CL 113*  --  104 98  CO2 21*  --  24 30  GLUCOSE 83  --  82 93  BUN 19  --  33* 15  CREATININE 3.34*  --  6.20* 3.66*  CALCIUM 6.0*  --  8.5* 8.3*  MG  --   --  1.6* 1.5*  PHOS  --  1.2* 2.5 2.4*   Liver Function Tests: Recent Labs  Lab 01/21/20 1204  AST 9*  ALT <5  ALKPHOS 54  BILITOT 0.4  PROT 5.4*  ALBUMIN 1.7*   Recent Labs  Lab 01/21/20 1204  LIPASE 15   No results for input(s): AMMONIA in the last 168 hours. CBC: Recent Labs  Lab 01/21/20 1109 01/22/20 1150 01/23/20 0601  WBC 11.9* 6.5 7.4  NEUTROABS  --   --  5.8  HGB 11.2* 9.1* 8.6*  HCT 37.9 31.1* 30.0*  MCV 85.9 85.2 87.0  PLT 199 197 151   Cardiac Enzymes: No results for input(s): CKTOTAL, CKMB, CKMBINDEX, TROPONINI in the last 168 hours. BNP: Invalid input(s): POCBNP CBG: Recent Labs  Lab 01/22/20 1607 01/22/20 1612 01/22/20 1638 01/22/20 2125 01/23/20 0733  GLUCAP 60* 53* 140* 81 81   D-Dimer No results for input(s): DDIMER in the last 72 hours. Hgb A1c No results for input(s): HGBA1C in the last 72 hours. Lipid Profile No results for input(s): CHOL, HDL, LDLCALC,  TRIG, CHOLHDL, LDLDIRECT in the last 72 hours. Thyroid function studies No results for input(s): TSH, T4TOTAL, T3FREE, THYROIDAB in the last 72 hours.  Invalid input(s): FREET3 Anemia work up No results for input(s): VITAMINB12, FOLATE, FERRITIN, TIBC, IRON, RETICCTPCT in the last 72 hours. Urinalysis    Component Value Date/Time   COLORURINE YELLOW (A) 01/21/2020 1204   APPEARANCEUR  TURBID (A) 01/21/2020 1204   APPEARANCEUR Cloudy (A) 02/28/2019 0902   LABSPEC 1.005 01/21/2020 1204   LABSPEC 1.012 08/02/2012 1114   PHURINE 9.0 (H) 01/21/2020 1204   GLUCOSEU 50 (A) 01/21/2020 1204   GLUCOSEU Negative 08/02/2012 1114   HGBUR MODERATE (A) 01/21/2020 1204   BILIRUBINUR NEGATIVE 01/21/2020 1204   BILIRUBINUR Negative 02/28/2019 0902   BILIRUBINUR Negative 08/02/2012 1114   KETONESUR NEGATIVE 01/21/2020 1204   PROTEINUR 100 (A) 01/21/2020 1204   NITRITE NEGATIVE 01/21/2020 1204   LEUKOCYTESUR LARGE (A) 01/21/2020 1204   LEUKOCYTESUR Negative 08/02/2012 1114   Sepsis Labs Invalid input(s): PROCALCITONIN,  WBC,  LACTICIDVEN Microbiology Recent Results (from the past 240 hour(s))  Urine Culture     Status: None   Collection Time: 01/21/20  1:10 PM   Specimen: Urine, Random  Result Value Ref Range Status   Specimen Description   Final    URINE, RANDOM Performed at [facility], [address], [place], [place] [address]    Special Requests   Final    NONE Performed at [facility], [address] [place], [place] [address]    Culture   Final    NO GROWTH Performed at [facility], [place] [address], [place], [place] [address]    Report Status 01/22/2020 FINAL  Final  MRSA PCR Screening     Status: Abnormal   Collection Time: 01/21/20  7:18 PM   Specimen: Nasopharyngeal  Result Value Ref Range Status   MRSA by PCR POSITIVE (A) NEGATIVE Final    Comment:        The GeneXpert MRSA Assay (FDA approved for NASAL specimens only), is one component of  a comprehensive MRSA colonization surveillance program. It is not intended to diagnose MRSA infection nor to guide or monitor treatment for MRSA infections. RESULT CALLED TO, READ BACK BY AND VERIFIED WITH: KIM BOYCE @0011  01/22/20 MJU Performed at [facility], [address] [place], [place] [address]     Discharge Instructions     Discharge Instructions    Increase activity slowly   Complete by: As directed    No wound care   Complete by: As directed      Allergies as of 01/25/2020      Reactions   Glipizide Er Other (See Comments)   Hypoglycemia   Percocet [oxycodone-acetaminophen] Hives      Medication List    STOP taking these medications   albuterol 108 (90 Base) MCG/ACT inhaler Commonly known as: VENTOLIN HFA   amLODipine 5 MG tablet Commonly known as: NORVASC   atorvastatin 40 MG tablet Commonly known as: LIPITOR   B-complex with vitamin C tablet   calcium acetate (Phos Binder) 667 MG/5ML Soln Commonly known as: PHOSLYRA   calcium carbonate 500 MG chewable tablet Commonly known as: TUMS - dosed in mg elemental calcium   cholecalciferol 1000 units tablet Commonly known as: VITAMIN D   docusate sodium 100 MG capsule Commonly known as: COLACE   escitalopram 5 MG tablet Commonly known as: LEXAPRO   feeding supplement (PRO-STAT SUGAR FREE 64) Liqd   ferrous sulfate 300 (60 Fe) MG/5ML syrup   gabapentin 100 MG capsule Commonly known as: NEURONTIN   heparin 5000 UNIT/ML injection   Lacosamide 100 MG Tabs   magnesium hydroxide 400 MG/5ML suspension Commonly known as: MILK OF MAGNESIA   metoCLOPramide 5 MG/5ML solution Commonly known as: REGLAN   Multi-Vitamin tablet   NexIUM 40 MG packet Generic drug: esomeprazole  traMADol 50 MG tablet Commonly known as: ULTRAM     TAKE these medications   levETIRAcetam 500 MG/100ML Soln Commonly known as: KEPRRA Inject 100 mLs (500 mg total) into the vein every 12 (twelve) hours.    morphine CONCENTRATE 10 MG/0.5ML Soln concentrated solution Place 0.13-0.25 mLs (2.6-5 mg total) under the tongue every 2 (two) hours as needed for moderate pain, severe pain, anxiety or shortness of breath (EOL care).       Allergies  Allergen Reactions  . Glipizide Er Other (See Comments)    Hypoglycemia   . Percocet [Oxycodone-Acetaminophen] Hives    Dispo: The patient is from: SNF              Anticipated d/c is to: Hospice              Anticipated d/c date is: today              Patient currently is medically stable to d/c.       Time coordinating discharge: Over 30 minutes   SIGNED:   Harold Hedge, D.O. [facility] Pager: [phone]  01/25/2020, 1:50 PM

## 2020-01-25 NOTE — Progress Notes (Signed)
MD order received in [facility] to discharge pt to the [facility] facility today;  Ivor Messier, Hospice liason to call report to the [facility]ospice Home and arrange nonemergency transportation by EMS; Hospice Home would like to have the pt at 5:30 this evening; discharge pending arrival of EMS

## 2020-01-25 NOTE — Progress Notes (Signed)
Palliative:  Mrs. Masters is lying quietly in bed.  She is full comfort care, actively dying.  She appears calm and comfortable, there is no family at bedside at this time.  She will be transferred to [facility] residential hospice facility in [place] today.  [place] (DNR) form signed.  Conference with attending, bedside nursing staff, transition of care team related to patient condition, needs, goals of care.  Plan: Comfort and dignity at end-of-life, residential hospice in [place]. Prognosis: Hours to days expected.  15 minutes Quinn Axe, NP Palliative medicine team Team phone [phone] Greater than 50% of this time was spent counseling and coordinating care related to the above assessment and plan.

## 2020-01-25 NOTE — Progress Notes (Addendum)
[facility] Room [facility] Liaison RN note:  [place] [facility] does have a bed available to offer today.Spoke with granddaughter, Geralynn Ochs, who will be signing consents today at 11:30am. Plan is to transfer late this afternoon. Health care team is aware.   Visited patient in room and she appears to be resting comfortably.  I will call report and EMS when appropriate.  Please call for any hospice related questions or concerns.  Thank you for the opportunity to participate in this patient's care.  Zandra Abts, RN [facility] Liaison [phone]

## 2020-01-25 NOTE — Care Management Important Message (Signed)
Important Message  Patient Details  Name: FOREVER ARECHIGA MRN: [id number] Date of Birth: 12-16-1940   Medicare Important Message Given:  Other (see comment)  Patient on [place] and out of respect of the patient and family no Important Message given.  Juliann Pulse A Desirai Traxler 01/25/2020, 8:07 AM

## 2020-01-25 NOTE — Progress Notes (Signed)
EMS present for pt discharge to [facility]; discharge packet given to EMS personnel to take to the facility; pt discharged via stretcher

## 2020-01-25 NOTE — TOC Transition Note (Signed)
Transition of Care [facility]) - CM/SW Discharge Note   Patient Details  Name: Barbara [name] MRN: [id number] Date of Birth: [birth year]  [facility] CM/SW Contact:  Shelbie Hutching, RN Phone Number: 01/25/2020, 10:35 AM   Clinical Narrative:    Patient will discharge to residential hospice today.  Family is aware of discharge plan.  Patient will be going to [facility] in [place].  [facility] will provide transport late this afternoon.    Final next level of care: [place] Barriers to Discharge: Barriers Resolved   Patient Goals and CMS Choice   CMS Medicare.gov Compare Post Acute Care list provided to:: Patient Represenative (must comment) Choice offered to / list presented to : Adult Children  Discharge Placement                       Discharge Plan and Services     Post Acute Care Choice: Hospice                               Social Determinants of Health (SDOH) Interventions     Readmission Risk Interventions Readmission Risk Prevention Plan 09/29/2019  Transportation Screening Complete  Medication Review (RN Care Manager) Complete  Palliative Care Screening Not Applicable  Some recent data might be hidden

## 2020-02-15 ENCOUNTER — Ambulatory Visit (INDEPENDENT_AMBULATORY_CARE_PROVIDER_SITE_OTHER): Payer: Medicare Other | Admitting: Nurse Practitioner

## 2020-02-15 ENCOUNTER — Encounter (INDEPENDENT_AMBULATORY_CARE_PROVIDER_SITE_OTHER): Payer: Medicare Other

## 2020-02-18 DEATH — deceased

## 2020-02-28 ENCOUNTER — Other Ambulatory Visit: Payer: Self-pay | Admitting: Urology
